# Patient Record
Sex: Male | Born: 1940 | Race: White | Hispanic: No | Marital: Married | Smoking: Former smoker
Health system: Southern US, Community
[De-identification: ages and names within clinical notes are randomized; demographics above are authoritative.]

## PROBLEM LIST (undated history)

## (undated) DIAGNOSIS — K5790 Diverticulosis of intestine, part unspecified, without perforation or abscess without bleeding: Secondary | ICD-10-CM

## (undated) DIAGNOSIS — Q212 Atrioventricular septal defect, unspecified as to partial or complete: Secondary | ICD-10-CM

## (undated) DIAGNOSIS — E119 Type 2 diabetes mellitus without complications: Secondary | ICD-10-CM

## (undated) DIAGNOSIS — I739 Peripheral vascular disease, unspecified: Secondary | ICD-10-CM

## (undated) DIAGNOSIS — C439 Malignant melanoma of skin, unspecified: Secondary | ICD-10-CM

## (undated) DIAGNOSIS — E785 Hyperlipidemia, unspecified: Secondary | ICD-10-CM

## (undated) DIAGNOSIS — I10 Essential (primary) hypertension: Secondary | ICD-10-CM

## (undated) DIAGNOSIS — H40059 Ocular hypertension, unspecified eye: Secondary | ICD-10-CM

## (undated) DIAGNOSIS — H43819 Vitreous degeneration, unspecified eye: Secondary | ICD-10-CM

## (undated) DIAGNOSIS — I5032 Chronic diastolic (congestive) heart failure: Secondary | ICD-10-CM

## (undated) DIAGNOSIS — M1990 Unspecified osteoarthritis, unspecified site: Secondary | ICD-10-CM

## (undated) DIAGNOSIS — M109 Gout, unspecified: Secondary | ICD-10-CM

## (undated) DIAGNOSIS — K2270 Barrett's esophagus without dysplasia: Secondary | ICD-10-CM

## (undated) DIAGNOSIS — K635 Polyp of colon: Secondary | ICD-10-CM

## (undated) DIAGNOSIS — D649 Anemia, unspecified: Secondary | ICD-10-CM

## (undated) DIAGNOSIS — C4490 Unspecified malignant neoplasm of skin, unspecified: Secondary | ICD-10-CM

## (undated) DIAGNOSIS — J219 Acute bronchiolitis, unspecified: Secondary | ICD-10-CM

## (undated) DIAGNOSIS — I219 Acute myocardial infarction, unspecified: Secondary | ICD-10-CM

## (undated) DIAGNOSIS — D1800 Hemangioma unspecified site: Secondary | ICD-10-CM

## (undated) DIAGNOSIS — I519 Heart disease, unspecified: Secondary | ICD-10-CM

## (undated) DIAGNOSIS — G4730 Sleep apnea, unspecified: Secondary | ICD-10-CM

## (undated) DIAGNOSIS — C801 Malignant (primary) neoplasm, unspecified: Secondary | ICD-10-CM

## (undated) HISTORY — PX: FEMORAL ARTERY STENT: SHX1583

## (undated) HISTORY — DX: Sleep apnea, unspecified: G47.30

## (undated) HISTORY — PX: HERNIA REPAIR: SHX51

## (undated) HISTORY — DX: Type 2 diabetes mellitus without complications: E11.9

## (undated) HISTORY — PX: NOSE SURGERY: SHX723

## (undated) HISTORY — DX: Malignant (primary) neoplasm, unspecified: C80.1

## (undated) HISTORY — DX: Barrett's esophagus without dysplasia: K22.70

## (undated) HISTORY — DX: Ocular hypertension, unspecified eye: H40.059

## (undated) HISTORY — DX: Atrioventricular septal defect, unspecified as to partial or complete: Q21.20

## (undated) HISTORY — DX: Vitreous degeneration, unspecified eye: H43.819

## (undated) HISTORY — DX: Malignant melanoma of skin, unspecified: C43.9

## (undated) HISTORY — PX: ROTATOR CUFF REPAIR: SHX139

## (undated) HISTORY — DX: Polyp of colon: K63.5

## (undated) HISTORY — DX: Hyperlipidemia, unspecified: E78.5

## (undated) HISTORY — DX: Unspecified malignant neoplasm of skin, unspecified: C44.90

## (undated) HISTORY — DX: Essential (primary) hypertension: I10

## (undated) HISTORY — DX: Diverticulosis of intestine, part unspecified, without perforation or abscess without bleeding: K57.90

## (undated) HISTORY — DX: Atrioventricular septal defect: Q21.2

## (undated) HISTORY — DX: Anemia, unspecified: D64.9

## (undated) HISTORY — DX: Heart disease, unspecified: I51.9

## (undated) HISTORY — DX: Acute bronchiolitis, unspecified: J21.9

## (undated) HISTORY — DX: Gout, unspecified: M10.9

## (undated) HISTORY — PX: CATARACT EXTRACTION: SUR2

## (undated) HISTORY — DX: Unspecified osteoarthritis, unspecified site: M19.90

## (undated) HISTORY — DX: Acute myocardial infarction, unspecified: I21.9

## (undated) HISTORY — DX: Hemangioma unspecified site: D18.00

## (undated) HISTORY — PX: INTRAOCULAR LENS INSERTION: SHX110

---

## 2000-12-10 DIAGNOSIS — C801 Malignant (primary) neoplasm, unspecified: Secondary | ICD-10-CM

## 2000-12-10 HISTORY — PX: PROSTATE SURGERY: SHX751

## 2000-12-10 HISTORY — DX: Malignant (primary) neoplasm, unspecified: C80.1

## 2005-09-25 ENCOUNTER — Ambulatory Visit: Payer: Self-pay

## 2005-12-06 ENCOUNTER — Ambulatory Visit: Payer: Self-pay | Admitting: Gastroenterology

## 2006-03-25 ENCOUNTER — Ambulatory Visit: Payer: Self-pay | Admitting: Internal Medicine

## 2006-05-02 ENCOUNTER — Ambulatory Visit: Payer: Self-pay | Admitting: Orthopaedic Surgery

## 2009-06-28 ENCOUNTER — Ambulatory Visit: Payer: Self-pay | Admitting: Specialist

## 2009-11-10 ENCOUNTER — Ambulatory Visit: Payer: Self-pay | Admitting: Internal Medicine

## 2009-12-27 ENCOUNTER — Ambulatory Visit: Payer: Self-pay | Admitting: Gastroenterology

## 2010-01-10 ENCOUNTER — Ambulatory Visit: Payer: Self-pay | Admitting: Gastroenterology

## 2010-02-09 ENCOUNTER — Ambulatory Visit: Payer: Self-pay | Admitting: Gastroenterology

## 2010-07-20 ENCOUNTER — Ambulatory Visit: Payer: Self-pay | Admitting: Gastroenterology

## 2010-11-06 ENCOUNTER — Ambulatory Visit: Payer: Self-pay | Admitting: Ophthalmology

## 2010-12-10 HISTORY — PX: CORONARY ANGIOPLASTY WITH STENT PLACEMENT: SHX49

## 2011-03-05 ENCOUNTER — Ambulatory Visit: Payer: Self-pay | Admitting: Ophthalmology

## 2011-06-04 ENCOUNTER — Inpatient Hospital Stay: Payer: Self-pay | Admitting: Internal Medicine

## 2011-07-19 ENCOUNTER — Encounter: Payer: Self-pay | Admitting: Internal Medicine

## 2011-08-11 ENCOUNTER — Encounter: Payer: Self-pay | Admitting: Internal Medicine

## 2011-08-17 ENCOUNTER — Ambulatory Visit: Payer: Self-pay | Admitting: Gastroenterology

## 2011-09-10 ENCOUNTER — Encounter: Payer: Self-pay | Admitting: Internal Medicine

## 2012-03-20 DIAGNOSIS — I2510 Atherosclerotic heart disease of native coronary artery without angina pectoris: Secondary | ICD-10-CM | POA: Diagnosis not present

## 2012-03-20 DIAGNOSIS — R011 Cardiac murmur, unspecified: Secondary | ICD-10-CM | POA: Diagnosis not present

## 2012-04-17 DIAGNOSIS — E7800 Pure hypercholesterolemia, unspecified: Secondary | ICD-10-CM | POA: Diagnosis not present

## 2012-04-17 DIAGNOSIS — D649 Anemia, unspecified: Secondary | ICD-10-CM | POA: Diagnosis not present

## 2012-04-17 DIAGNOSIS — E039 Hypothyroidism, unspecified: Secondary | ICD-10-CM | POA: Diagnosis not present

## 2012-04-17 DIAGNOSIS — Z125 Encounter for screening for malignant neoplasm of prostate: Secondary | ICD-10-CM | POA: Diagnosis not present

## 2012-04-17 DIAGNOSIS — E785 Hyperlipidemia, unspecified: Secondary | ICD-10-CM | POA: Diagnosis not present

## 2012-04-24 DIAGNOSIS — I214 Non-ST elevation (NSTEMI) myocardial infarction: Secondary | ICD-10-CM | POA: Diagnosis not present

## 2012-04-24 DIAGNOSIS — K5732 Diverticulitis of large intestine without perforation or abscess without bleeding: Secondary | ICD-10-CM | POA: Diagnosis not present

## 2012-04-24 DIAGNOSIS — R0602 Shortness of breath: Secondary | ICD-10-CM | POA: Diagnosis not present

## 2012-04-24 DIAGNOSIS — I209 Angina pectoris, unspecified: Secondary | ICD-10-CM | POA: Diagnosis not present

## 2012-04-24 DIAGNOSIS — E119 Type 2 diabetes mellitus without complications: Secondary | ICD-10-CM | POA: Diagnosis not present

## 2012-04-24 DIAGNOSIS — E785 Hyperlipidemia, unspecified: Secondary | ICD-10-CM | POA: Diagnosis not present

## 2012-08-14 DIAGNOSIS — I998 Other disorder of circulatory system: Secondary | ICD-10-CM | POA: Diagnosis not present

## 2012-08-14 DIAGNOSIS — I10 Essential (primary) hypertension: Secondary | ICD-10-CM | POA: Diagnosis not present

## 2012-08-14 DIAGNOSIS — E119 Type 2 diabetes mellitus without complications: Secondary | ICD-10-CM | POA: Diagnosis not present

## 2012-08-18 ENCOUNTER — Ambulatory Visit: Payer: Self-pay | Admitting: Gastroenterology

## 2012-08-18 DIAGNOSIS — R933 Abnormal findings on diagnostic imaging of other parts of digestive tract: Secondary | ICD-10-CM | POA: Diagnosis not present

## 2012-08-18 DIAGNOSIS — D1803 Hemangioma of intra-abdominal structures: Secondary | ICD-10-CM | POA: Diagnosis not present

## 2012-08-18 DIAGNOSIS — D1800 Hemangioma unspecified site: Secondary | ICD-10-CM | POA: Diagnosis not present

## 2012-08-21 DIAGNOSIS — Z79899 Other long term (current) drug therapy: Secondary | ICD-10-CM | POA: Diagnosis not present

## 2012-10-06 DIAGNOSIS — I209 Angina pectoris, unspecified: Secondary | ICD-10-CM | POA: Diagnosis not present

## 2012-10-06 DIAGNOSIS — I2510 Atherosclerotic heart disease of native coronary artery without angina pectoris: Secondary | ICD-10-CM | POA: Diagnosis not present

## 2012-10-27 DIAGNOSIS — I70219 Atherosclerosis of native arteries of extremities with intermittent claudication, unspecified extremity: Secondary | ICD-10-CM | POA: Diagnosis not present

## 2012-10-27 DIAGNOSIS — I259 Chronic ischemic heart disease, unspecified: Secondary | ICD-10-CM | POA: Diagnosis not present

## 2012-10-27 DIAGNOSIS — I739 Peripheral vascular disease, unspecified: Secondary | ICD-10-CM | POA: Diagnosis not present

## 2012-10-27 DIAGNOSIS — I252 Old myocardial infarction: Secondary | ICD-10-CM | POA: Diagnosis not present

## 2012-10-29 DIAGNOSIS — I739 Peripheral vascular disease, unspecified: Secondary | ICD-10-CM | POA: Diagnosis not present

## 2012-10-29 DIAGNOSIS — I70219 Atherosclerosis of native arteries of extremities with intermittent claudication, unspecified extremity: Secondary | ICD-10-CM | POA: Diagnosis not present

## 2012-10-31 DIAGNOSIS — J301 Allergic rhinitis due to pollen: Secondary | ICD-10-CM | POA: Diagnosis not present

## 2012-10-31 DIAGNOSIS — T7840XA Allergy, unspecified, initial encounter: Secondary | ICD-10-CM | POA: Diagnosis not present

## 2012-10-31 DIAGNOSIS — H6120 Impacted cerumen, unspecified ear: Secondary | ICD-10-CM | POA: Diagnosis not present

## 2012-11-03 DIAGNOSIS — E785 Hyperlipidemia, unspecified: Secondary | ICD-10-CM | POA: Diagnosis not present

## 2012-11-03 DIAGNOSIS — I10 Essential (primary) hypertension: Secondary | ICD-10-CM | POA: Diagnosis not present

## 2012-11-03 DIAGNOSIS — I739 Peripheral vascular disease, unspecified: Secondary | ICD-10-CM | POA: Diagnosis not present

## 2012-11-13 DIAGNOSIS — K219 Gastro-esophageal reflux disease without esophagitis: Secondary | ICD-10-CM | POA: Diagnosis not present

## 2012-11-13 DIAGNOSIS — I10 Essential (primary) hypertension: Secondary | ICD-10-CM | POA: Diagnosis not present

## 2012-11-13 DIAGNOSIS — E785 Hyperlipidemia, unspecified: Secondary | ICD-10-CM | POA: Diagnosis not present

## 2012-11-13 DIAGNOSIS — E119 Type 2 diabetes mellitus without complications: Secondary | ICD-10-CM | POA: Diagnosis not present

## 2012-11-13 DIAGNOSIS — E1149 Type 2 diabetes mellitus with other diabetic neurological complication: Secondary | ICD-10-CM | POA: Diagnosis not present

## 2012-11-17 DIAGNOSIS — D485 Neoplasm of uncertain behavior of skin: Secondary | ICD-10-CM | POA: Diagnosis not present

## 2012-11-17 DIAGNOSIS — L821 Other seborrheic keratosis: Secondary | ICD-10-CM | POA: Diagnosis not present

## 2012-11-17 DIAGNOSIS — L280 Lichen simplex chronicus: Secondary | ICD-10-CM | POA: Diagnosis not present

## 2012-11-17 DIAGNOSIS — L570 Actinic keratosis: Secondary | ICD-10-CM | POA: Diagnosis not present

## 2012-12-24 DIAGNOSIS — Z961 Presence of intraocular lens: Secondary | ICD-10-CM | POA: Diagnosis not present

## 2012-12-24 DIAGNOSIS — H2510 Age-related nuclear cataract, unspecified eye: Secondary | ICD-10-CM | POA: Diagnosis not present

## 2013-01-29 DIAGNOSIS — K2270 Barrett's esophagus without dysplasia: Secondary | ICD-10-CM | POA: Diagnosis not present

## 2013-02-12 DIAGNOSIS — M75100 Unspecified rotator cuff tear or rupture of unspecified shoulder, not specified as traumatic: Secondary | ICD-10-CM | POA: Diagnosis not present

## 2013-02-12 DIAGNOSIS — K219 Gastro-esophageal reflux disease without esophagitis: Secondary | ICD-10-CM | POA: Diagnosis not present

## 2013-02-12 DIAGNOSIS — E109 Type 1 diabetes mellitus without complications: Secondary | ICD-10-CM | POA: Diagnosis not present

## 2013-03-02 DIAGNOSIS — R0602 Shortness of breath: Secondary | ICD-10-CM | POA: Diagnosis not present

## 2013-03-03 DIAGNOSIS — R0602 Shortness of breath: Secondary | ICD-10-CM | POA: Diagnosis not present

## 2013-03-05 DIAGNOSIS — M5412 Radiculopathy, cervical region: Secondary | ICD-10-CM | POA: Diagnosis not present

## 2013-03-05 DIAGNOSIS — M6530 Trigger finger, unspecified finger: Secondary | ICD-10-CM | POA: Diagnosis not present

## 2013-03-19 DIAGNOSIS — M6530 Trigger finger, unspecified finger: Secondary | ICD-10-CM | POA: Diagnosis not present

## 2013-03-19 DIAGNOSIS — M5412 Radiculopathy, cervical region: Secondary | ICD-10-CM | POA: Diagnosis not present

## 2013-03-23 ENCOUNTER — Ambulatory Visit: Payer: Self-pay | Admitting: Internal Medicine

## 2013-03-23 DIAGNOSIS — R0602 Shortness of breath: Secondary | ICD-10-CM | POA: Diagnosis not present

## 2013-03-26 DIAGNOSIS — I200 Unstable angina: Secondary | ICD-10-CM | POA: Diagnosis not present

## 2013-03-26 DIAGNOSIS — I2510 Atherosclerotic heart disease of native coronary artery without angina pectoris: Secondary | ICD-10-CM | POA: Diagnosis not present

## 2013-04-02 ENCOUNTER — Ambulatory Visit: Payer: Self-pay | Admitting: Gastroenterology

## 2013-04-02 DIAGNOSIS — Z888 Allergy status to other drugs, medicaments and biological substances status: Secondary | ICD-10-CM | POA: Diagnosis not present

## 2013-04-02 DIAGNOSIS — K449 Diaphragmatic hernia without obstruction or gangrene: Secondary | ICD-10-CM | POA: Diagnosis not present

## 2013-04-02 DIAGNOSIS — E7800 Pure hypercholesterolemia, unspecified: Secondary | ICD-10-CM | POA: Diagnosis not present

## 2013-04-02 DIAGNOSIS — M109 Gout, unspecified: Secondary | ICD-10-CM | POA: Diagnosis not present

## 2013-04-02 DIAGNOSIS — Z91010 Allergy to peanuts: Secondary | ICD-10-CM | POA: Diagnosis not present

## 2013-04-02 DIAGNOSIS — Z87891 Personal history of nicotine dependence: Secondary | ICD-10-CM | POA: Diagnosis not present

## 2013-04-02 DIAGNOSIS — K2270 Barrett's esophagus without dysplasia: Secondary | ICD-10-CM | POA: Diagnosis not present

## 2013-04-02 DIAGNOSIS — Z79899 Other long term (current) drug therapy: Secondary | ICD-10-CM | POA: Diagnosis not present

## 2013-04-02 DIAGNOSIS — Z886 Allergy status to analgesic agent status: Secondary | ICD-10-CM | POA: Diagnosis not present

## 2013-04-02 DIAGNOSIS — N400 Enlarged prostate without lower urinary tract symptoms: Secondary | ICD-10-CM | POA: Diagnosis not present

## 2013-04-02 DIAGNOSIS — K5730 Diverticulosis of large intestine without perforation or abscess without bleeding: Secondary | ICD-10-CM | POA: Diagnosis not present

## 2013-04-02 DIAGNOSIS — I2510 Atherosclerotic heart disease of native coronary artery without angina pectoris: Secondary | ICD-10-CM | POA: Diagnosis not present

## 2013-04-02 DIAGNOSIS — I10 Essential (primary) hypertension: Secondary | ICD-10-CM | POA: Diagnosis not present

## 2013-04-02 DIAGNOSIS — Z887 Allergy status to serum and vaccine status: Secondary | ICD-10-CM | POA: Diagnosis not present

## 2013-04-02 DIAGNOSIS — E119 Type 2 diabetes mellitus without complications: Secondary | ICD-10-CM | POA: Diagnosis not present

## 2013-04-02 DIAGNOSIS — Z8546 Personal history of malignant neoplasm of prostate: Secondary | ICD-10-CM | POA: Diagnosis not present

## 2013-04-02 DIAGNOSIS — Z7982 Long term (current) use of aspirin: Secondary | ICD-10-CM | POA: Diagnosis not present

## 2013-04-16 DIAGNOSIS — K2270 Barrett's esophagus without dysplasia: Secondary | ICD-10-CM | POA: Diagnosis not present

## 2013-04-16 DIAGNOSIS — K5900 Constipation, unspecified: Secondary | ICD-10-CM | POA: Diagnosis not present

## 2013-04-16 DIAGNOSIS — K219 Gastro-esophageal reflux disease without esophagitis: Secondary | ICD-10-CM | POA: Diagnosis not present

## 2013-04-23 DIAGNOSIS — I209 Angina pectoris, unspecified: Secondary | ICD-10-CM | POA: Diagnosis not present

## 2013-04-23 DIAGNOSIS — E119 Type 2 diabetes mellitus without complications: Secondary | ICD-10-CM | POA: Diagnosis not present

## 2013-04-23 DIAGNOSIS — I10 Essential (primary) hypertension: Secondary | ICD-10-CM | POA: Diagnosis not present

## 2013-04-23 DIAGNOSIS — I248 Other forms of acute ischemic heart disease: Secondary | ICD-10-CM | POA: Diagnosis not present

## 2013-06-04 DIAGNOSIS — E119 Type 2 diabetes mellitus without complications: Secondary | ICD-10-CM | POA: Diagnosis not present

## 2013-07-02 DIAGNOSIS — I119 Hypertensive heart disease without heart failure: Secondary | ICD-10-CM | POA: Diagnosis not present

## 2013-07-02 DIAGNOSIS — M75100 Unspecified rotator cuff tear or rupture of unspecified shoulder, not specified as traumatic: Secondary | ICD-10-CM | POA: Diagnosis not present

## 2013-07-02 DIAGNOSIS — K219 Gastro-esophageal reflux disease without esophagitis: Secondary | ICD-10-CM | POA: Diagnosis not present

## 2013-07-02 DIAGNOSIS — K5730 Diverticulosis of large intestine without perforation or abscess without bleeding: Secondary | ICD-10-CM | POA: Diagnosis not present

## 2013-09-29 DIAGNOSIS — I2510 Atherosclerotic heart disease of native coronary artery without angina pectoris: Secondary | ICD-10-CM | POA: Diagnosis not present

## 2013-09-29 DIAGNOSIS — I209 Angina pectoris, unspecified: Secondary | ICD-10-CM | POA: Diagnosis not present

## 2013-09-29 DIAGNOSIS — I10 Essential (primary) hypertension: Secondary | ICD-10-CM | POA: Diagnosis not present

## 2013-10-05 DIAGNOSIS — H43819 Vitreous degeneration, unspecified eye: Secondary | ICD-10-CM | POA: Diagnosis not present

## 2013-10-06 DIAGNOSIS — L570 Actinic keratosis: Secondary | ICD-10-CM | POA: Diagnosis not present

## 2013-10-06 DIAGNOSIS — D485 Neoplasm of uncertain behavior of skin: Secondary | ICD-10-CM | POA: Diagnosis not present

## 2013-10-06 DIAGNOSIS — L821 Other seborrheic keratosis: Secondary | ICD-10-CM | POA: Diagnosis not present

## 2013-10-06 DIAGNOSIS — D235 Other benign neoplasm of skin of trunk: Secondary | ICD-10-CM | POA: Diagnosis not present

## 2013-10-08 DIAGNOSIS — I259 Chronic ischemic heart disease, unspecified: Secondary | ICD-10-CM | POA: Diagnosis not present

## 2013-10-08 DIAGNOSIS — I10 Essential (primary) hypertension: Secondary | ICD-10-CM | POA: Diagnosis not present

## 2013-10-08 DIAGNOSIS — E119 Type 2 diabetes mellitus without complications: Secondary | ICD-10-CM | POA: Diagnosis not present

## 2013-10-08 DIAGNOSIS — E785 Hyperlipidemia, unspecified: Secondary | ICD-10-CM | POA: Diagnosis not present

## 2013-11-24 DIAGNOSIS — H43819 Vitreous degeneration, unspecified eye: Secondary | ICD-10-CM | POA: Diagnosis not present

## 2013-12-29 DIAGNOSIS — H43399 Other vitreous opacities, unspecified eye: Secondary | ICD-10-CM | POA: Diagnosis not present

## 2014-02-18 DIAGNOSIS — I252 Old myocardial infarction: Secondary | ICD-10-CM | POA: Diagnosis not present

## 2014-02-18 DIAGNOSIS — I209 Angina pectoris, unspecified: Secondary | ICD-10-CM | POA: Diagnosis not present

## 2014-02-18 DIAGNOSIS — K219 Gastro-esophageal reflux disease without esophagitis: Secondary | ICD-10-CM | POA: Diagnosis not present

## 2014-02-18 DIAGNOSIS — I259 Chronic ischemic heart disease, unspecified: Secondary | ICD-10-CM | POA: Diagnosis not present

## 2014-02-23 DIAGNOSIS — R079 Chest pain, unspecified: Secondary | ICD-10-CM | POA: Diagnosis not present

## 2014-03-15 DIAGNOSIS — E119 Type 2 diabetes mellitus without complications: Secondary | ICD-10-CM | POA: Diagnosis not present

## 2014-03-15 DIAGNOSIS — K5732 Diverticulitis of large intestine without perforation or abscess without bleeding: Secondary | ICD-10-CM | POA: Diagnosis not present

## 2014-03-19 DIAGNOSIS — K5732 Diverticulitis of large intestine without perforation or abscess without bleeding: Secondary | ICD-10-CM | POA: Diagnosis not present

## 2014-03-19 DIAGNOSIS — N181 Chronic kidney disease, stage 1: Secondary | ICD-10-CM | POA: Diagnosis not present

## 2014-03-22 ENCOUNTER — Ambulatory Visit: Payer: Self-pay | Admitting: General Surgery

## 2014-03-23 DIAGNOSIS — K5732 Diverticulitis of large intestine without perforation or abscess without bleeding: Secondary | ICD-10-CM | POA: Diagnosis not present

## 2014-03-23 DIAGNOSIS — I252 Old myocardial infarction: Secondary | ICD-10-CM | POA: Diagnosis not present

## 2014-03-23 DIAGNOSIS — I259 Chronic ischemic heart disease, unspecified: Secondary | ICD-10-CM | POA: Diagnosis not present

## 2014-03-30 ENCOUNTER — Ambulatory Visit (INDEPENDENT_AMBULATORY_CARE_PROVIDER_SITE_OTHER): Payer: Medicare Other | Admitting: General Surgery

## 2014-03-30 ENCOUNTER — Encounter: Payer: Self-pay | Admitting: General Surgery

## 2014-03-30 VITALS — BP 120/72 | HR 68 | Resp 12 | Ht 67.0 in | Wt 177.0 lb

## 2014-03-30 DIAGNOSIS — M79609 Pain in unspecified limb: Secondary | ICD-10-CM

## 2014-03-30 DIAGNOSIS — M79606 Pain in leg, unspecified: Secondary | ICD-10-CM

## 2014-03-30 NOTE — Progress Notes (Signed)
Patient ID: Mike Decker, male   DOB: 07-16-1941, 73 y.o.   MRN: [id number]  Chief Complaint  Patient presents with  . Other    PVD    HPI Mike Decker is a 73 y.o. male who presents for an evaluation of bilateral leg pain. The pain has been present for approximately 1 year. The patient states he does not wear compression hose but does wear diabetic socks. He has not found anything to help alleviate the pain in his legs. The pain is located in the bottom of his feet up to his knees. The pain is present on a daily basis. Pain occurs when sitting and on walking varying distance.  HPI  Past Medical History  Diagnosis Date  . Arthritis   . Colon polyp   . Diabetes mellitus without complication   . Hyperlipidemia   . Myocardial infarct   . Heart disease   . Hypertension   . Atrioventricular canal (AVC)     irregular heart beats  . Diverticulosis   . Barrett esophagus   . Sleep apnea   . Gout   . Vitreoretinal degeneration   . Ocular hypertension   . Hemangioma     liver  . Skin cancer   . Cancer 2002    prostate    Past Surgical History  Procedure Laterality Date  . Prostate surgery  2002  . Coronary angioplasty with stent placement  2012  . Intraocular lens insertion    . Hernia repair      x2  . Cataract extraction  2011, 2012  . Rotator cuff repair Right   . Nose surgery      submucous resection    History reviewed. No pertinent family history.  Social History History  Substance Use Topics  . Smoking status: Former Smoker -- 20 years    Types: Cigarettes    Quit date: 12/10/1976  . Smokeless tobacco: Never Used  . Alcohol Use: No    Allergies  Allergen Reactions  . Peanut-Containing Drug Products Anaphylaxis  . Bee Venom Swelling  . Influenza Vaccines Hives  . Inh [Isoniazid] Hives  . Kenalog [Triamcinolone Acetonide] Hives  . Nalfon [Fenoprofen Calcium] Hives  . Penicillins Hives    Current Outpatient Prescriptions  Medication Sig  Dispense Refill  . acetaminophen (TYLENOL) 325 MG tablet Take 650 mg by mouth every 6 (six) hours as needed.      Marland Kitchen allopurinol (ZYLOPRIM) 100 MG tablet Take 200 mg by mouth daily.      Marland Kitchen amLODipine (NORVASC) 5 MG tablet Take 5 mg by mouth daily.      Marland Kitchen aspirin 81 MG tablet Take 81 mg by mouth daily.      . cetirizine (ZYRTEC) 10 MG tablet Take 10 mg by mouth as needed for allergies.      . citalopram (CELEXA) 10 MG tablet Take 10 mg by mouth daily.      . cloNIDine (CATAPRES) 0.1 MG tablet Take 0.1 mg by mouth daily.      Marland Kitchen EPINEPHrine (EPI-PEN) 0.3 mg/0.3 mL SOAJ injection Inject into the muscle as needed.      . insulin glargine (LANTUS) 100 UNIT/ML injection Inject 30 Units into the skin every morning.      . insulin glargine (LANTUS) 100 UNIT/ML injection Inject 10 Units into the skin at bedtime.      . isosorbide mononitrate (IMDUR) 60 MG 24 hr tablet Take 60 mg by mouth daily.      Marland Kitchen  metFORMIN (GLUMETZA) 1000 MG (MOD) 24 hr tablet Take 1,000 mg by mouth 2 (two) times daily with a meal.      . metoprolol (LOPRESSOR) 50 MG tablet Take 50 mg by mouth 2 (two) times daily.      . nitroGLYCERIN (NITROSTAT) 0.4 MG SL tablet Place 0.4 mg under the tongue every 5 (five) minutes as needed for chest pain.      . pantoprazole (PROTONIX) 40 MG tablet Take 40 mg by mouth 2 (two) times daily.      . prasugrel (EFFIENT) 10 MG TABS tablet Take 10 mg by mouth daily.      . simvastatin (ZOCOR) 20 MG tablet Take 20 mg by mouth daily.       No current facility-administered medications for this visit.    Review of Systems Review of Systems  Constitutional: Negative.   Respiratory: Negative.   Cardiovascular: Negative.     Blood pressure 120/72, pulse 68, resp. rate 12, height 5\' 7"  (1.702 m), weight 177 lb (80.287 kg).  Physical Exam Physical Exam  Constitutional: He is oriented to person, place, and time. He appears well-developed and well-nourished.  Eyes: Conjunctivae are normal. No scleral  icterus.  Neck: Neck supple. No thyromegaly present.  Cardiovascular: Normal rate, regular rhythm and normal heart sounds.   No murmur heard. Pulses:      Carotid pulses are 2+ on the right side, and 2+ on the left side.      Femoral pulses are 2+ on the right side, and 2+ on the left side.      Popliteal pulses are 2+ on the right side, and 2+ on the left side.       Dorsalis pedis pulses are 1+ on the right side, and 1+ on the left side.       Posterior tibial pulses are 2+ on the right side, and 2+ on the left side.  VV behind left knee. No edema. No stasis changes. No bruits heard.  Pulmonary/Chest: Effort normal and breath sounds normal.  Abdominal: Soft. Normal appearance and bowel sounds are normal. There is no hepatosplenomegaly. There is no tenderness. No hernia.  Lymphadenopathy:    He has no cervical adenopathy.  Neurological: He is alert and oriented to person, place, and time.  Skin: Skin is warm and dry.  Varicose veins present behind left knee.   Data Reviewed    Assessment    No evidence of PAD. He has focal VV left popliteal fossa without any sequelae from it. Pt's leg symptoms are not of vascular source.     Plan    Patient advised about findings. Follow up as needed.        Seeplaputhur G Sankar 03/31/2014, 6:48 AM

## 2014-03-30 NOTE — Patient Instructions (Signed)
Patient to return as needed. The patient is aware to call back for any questions or concerns. 

## 2014-03-31 ENCOUNTER — Encounter: Payer: Self-pay | Admitting: General Surgery

## 2014-04-01 DIAGNOSIS — I2510 Atherosclerotic heart disease of native coronary artery without angina pectoris: Secondary | ICD-10-CM | POA: Diagnosis not present

## 2014-04-01 DIAGNOSIS — I10 Essential (primary) hypertension: Secondary | ICD-10-CM | POA: Diagnosis not present

## 2014-04-01 DIAGNOSIS — E119 Type 2 diabetes mellitus without complications: Secondary | ICD-10-CM | POA: Diagnosis not present

## 2014-04-01 DIAGNOSIS — I209 Angina pectoris, unspecified: Secondary | ICD-10-CM | POA: Diagnosis not present

## 2014-04-06 ENCOUNTER — Ambulatory Visit: Payer: Self-pay | Admitting: Internal Medicine

## 2014-04-06 DIAGNOSIS — K5730 Diverticulosis of large intestine without perforation or abscess without bleeding: Secondary | ICD-10-CM | POA: Diagnosis not present

## 2014-04-06 DIAGNOSIS — K4090 Unilateral inguinal hernia, without obstruction or gangrene, not specified as recurrent: Secondary | ICD-10-CM | POA: Diagnosis not present

## 2014-04-06 DIAGNOSIS — D1809 Hemangioma of other sites: Secondary | ICD-10-CM | POA: Diagnosis not present

## 2014-04-08 DIAGNOSIS — E785 Hyperlipidemia, unspecified: Secondary | ICD-10-CM | POA: Diagnosis not present

## 2014-04-13 DIAGNOSIS — I10 Essential (primary) hypertension: Secondary | ICD-10-CM | POA: Diagnosis not present

## 2014-04-13 DIAGNOSIS — K219 Gastro-esophageal reflux disease without esophagitis: Secondary | ICD-10-CM | POA: Diagnosis not present

## 2014-04-13 DIAGNOSIS — K5732 Diverticulitis of large intestine without perforation or abscess without bleeding: Secondary | ICD-10-CM | POA: Diagnosis not present

## 2014-04-13 DIAGNOSIS — I259 Chronic ischemic heart disease, unspecified: Secondary | ICD-10-CM | POA: Diagnosis not present

## 2014-05-18 DIAGNOSIS — I10 Essential (primary) hypertension: Secondary | ICD-10-CM | POA: Diagnosis not present

## 2014-05-18 DIAGNOSIS — D631 Anemia in chronic kidney disease: Secondary | ICD-10-CM | POA: Diagnosis not present

## 2014-05-18 DIAGNOSIS — N183 Chronic kidney disease, stage 3 unspecified: Secondary | ICD-10-CM | POA: Diagnosis not present

## 2014-05-18 DIAGNOSIS — E119 Type 2 diabetes mellitus without complications: Secondary | ICD-10-CM | POA: Diagnosis not present

## 2014-05-18 DIAGNOSIS — N2581 Secondary hyperparathyroidism of renal origin: Secondary | ICD-10-CM | POA: Diagnosis not present

## 2014-05-27 DIAGNOSIS — N183 Chronic kidney disease, stage 3 unspecified: Secondary | ICD-10-CM | POA: Diagnosis not present

## 2014-06-15 DIAGNOSIS — N039 Chronic nephritic syndrome with unspecified morphologic changes: Secondary | ICD-10-CM | POA: Diagnosis not present

## 2014-06-15 DIAGNOSIS — N2581 Secondary hyperparathyroidism of renal origin: Secondary | ICD-10-CM | POA: Diagnosis not present

## 2014-06-15 DIAGNOSIS — N183 Chronic kidney disease, stage 3 unspecified: Secondary | ICD-10-CM | POA: Diagnosis not present

## 2014-06-15 DIAGNOSIS — E119 Type 2 diabetes mellitus without complications: Secondary | ICD-10-CM | POA: Diagnosis not present

## 2014-06-15 DIAGNOSIS — I10 Essential (primary) hypertension: Secondary | ICD-10-CM | POA: Diagnosis not present

## 2014-06-15 DIAGNOSIS — D631 Anemia in chronic kidney disease: Secondary | ICD-10-CM | POA: Diagnosis not present

## 2014-06-24 DIAGNOSIS — N183 Chronic kidney disease, stage 3 unspecified: Secondary | ICD-10-CM | POA: Diagnosis not present

## 2014-06-24 DIAGNOSIS — R809 Proteinuria, unspecified: Secondary | ICD-10-CM | POA: Diagnosis not present

## 2014-06-24 DIAGNOSIS — E1129 Type 2 diabetes mellitus with other diabetic kidney complication: Secondary | ICD-10-CM | POA: Diagnosis not present

## 2014-06-24 DIAGNOSIS — I129 Hypertensive chronic kidney disease with stage 1 through stage 4 chronic kidney disease, or unspecified chronic kidney disease: Secondary | ICD-10-CM | POA: Diagnosis not present

## 2014-07-07 DIAGNOSIS — I129 Hypertensive chronic kidney disease with stage 1 through stage 4 chronic kidney disease, or unspecified chronic kidney disease: Secondary | ICD-10-CM | POA: Diagnosis not present

## 2014-07-07 DIAGNOSIS — R809 Proteinuria, unspecified: Secondary | ICD-10-CM | POA: Diagnosis not present

## 2014-07-07 DIAGNOSIS — E1129 Type 2 diabetes mellitus with other diabetic kidney complication: Secondary | ICD-10-CM | POA: Diagnosis not present

## 2014-07-07 DIAGNOSIS — E1165 Type 2 diabetes mellitus with hyperglycemia: Secondary | ICD-10-CM | POA: Diagnosis not present

## 2014-07-07 DIAGNOSIS — N183 Chronic kidney disease, stage 3 unspecified: Secondary | ICD-10-CM | POA: Diagnosis not present

## 2014-07-12 DIAGNOSIS — I10 Essential (primary) hypertension: Secondary | ICD-10-CM | POA: Diagnosis not present

## 2014-07-12 DIAGNOSIS — N039 Chronic nephritic syndrome with unspecified morphologic changes: Secondary | ICD-10-CM | POA: Diagnosis not present

## 2014-07-12 DIAGNOSIS — N2581 Secondary hyperparathyroidism of renal origin: Secondary | ICD-10-CM | POA: Diagnosis not present

## 2014-07-12 DIAGNOSIS — E119 Type 2 diabetes mellitus without complications: Secondary | ICD-10-CM | POA: Diagnosis not present

## 2014-07-12 DIAGNOSIS — N183 Chronic kidney disease, stage 3 unspecified: Secondary | ICD-10-CM | POA: Diagnosis not present

## 2014-07-12 DIAGNOSIS — D631 Anemia in chronic kidney disease: Secondary | ICD-10-CM | POA: Diagnosis not present

## 2014-07-13 DIAGNOSIS — I509 Heart failure, unspecified: Secondary | ICD-10-CM | POA: Diagnosis not present

## 2014-07-13 DIAGNOSIS — E119 Type 2 diabetes mellitus without complications: Secondary | ICD-10-CM | POA: Diagnosis not present

## 2014-07-13 DIAGNOSIS — IMO0001 Reserved for inherently not codable concepts without codable children: Secondary | ICD-10-CM | POA: Diagnosis not present

## 2014-07-13 DIAGNOSIS — N181 Chronic kidney disease, stage 1: Secondary | ICD-10-CM | POA: Diagnosis not present

## 2014-07-27 ENCOUNTER — Ambulatory Visit: Payer: Self-pay | Admitting: Internal Medicine

## 2014-07-27 DIAGNOSIS — Z7189 Other specified counseling: Secondary | ICD-10-CM | POA: Diagnosis not present

## 2014-07-27 DIAGNOSIS — E1129 Type 2 diabetes mellitus with other diabetic kidney complication: Secondary | ICD-10-CM | POA: Diagnosis not present

## 2014-07-27 DIAGNOSIS — R809 Proteinuria, unspecified: Secondary | ICD-10-CM | POA: Diagnosis not present

## 2014-07-27 DIAGNOSIS — E119 Type 2 diabetes mellitus without complications: Secondary | ICD-10-CM | POA: Diagnosis not present

## 2014-07-27 DIAGNOSIS — E1165 Type 2 diabetes mellitus with hyperglycemia: Secondary | ICD-10-CM | POA: Diagnosis not present

## 2014-07-27 DIAGNOSIS — N183 Chronic kidney disease, stage 3 unspecified: Secondary | ICD-10-CM | POA: Diagnosis not present

## 2014-07-27 DIAGNOSIS — I129 Hypertensive chronic kidney disease with stage 1 through stage 4 chronic kidney disease, or unspecified chronic kidney disease: Secondary | ICD-10-CM | POA: Diagnosis not present

## 2014-08-03 DIAGNOSIS — I214 Non-ST elevation (NSTEMI) myocardial infarction: Secondary | ICD-10-CM | POA: Diagnosis not present

## 2014-08-03 DIAGNOSIS — K5732 Diverticulitis of large intestine without perforation or abscess without bleeding: Secondary | ICD-10-CM | POA: Diagnosis not present

## 2014-08-03 DIAGNOSIS — E119 Type 2 diabetes mellitus without complications: Secondary | ICD-10-CM | POA: Diagnosis not present

## 2014-08-03 DIAGNOSIS — I252 Old myocardial infarction: Secondary | ICD-10-CM | POA: Diagnosis not present

## 2014-08-09 DIAGNOSIS — Z7189 Other specified counseling: Secondary | ICD-10-CM | POA: Diagnosis not present

## 2014-08-09 DIAGNOSIS — E119 Type 2 diabetes mellitus without complications: Secondary | ICD-10-CM | POA: Diagnosis not present

## 2014-08-10 ENCOUNTER — Ambulatory Visit: Payer: Self-pay | Admitting: Internal Medicine

## 2014-08-10 DIAGNOSIS — Z7189 Other specified counseling: Secondary | ICD-10-CM | POA: Diagnosis not present

## 2014-08-10 DIAGNOSIS — E119 Type 2 diabetes mellitus without complications: Secondary | ICD-10-CM | POA: Diagnosis not present

## 2014-09-09 ENCOUNTER — Ambulatory Visit: Payer: Self-pay | Admitting: Internal Medicine

## 2014-09-09 DIAGNOSIS — I129 Hypertensive chronic kidney disease with stage 1 through stage 4 chronic kidney disease, or unspecified chronic kidney disease: Secondary | ICD-10-CM | POA: Diagnosis not present

## 2014-09-09 DIAGNOSIS — E875 Hyperkalemia: Secondary | ICD-10-CM | POA: Diagnosis not present

## 2014-09-09 DIAGNOSIS — E1165 Type 2 diabetes mellitus with hyperglycemia: Secondary | ICD-10-CM | POA: Diagnosis not present

## 2014-09-09 DIAGNOSIS — N2581 Secondary hyperparathyroidism of renal origin: Secondary | ICD-10-CM | POA: Diagnosis not present

## 2014-09-09 DIAGNOSIS — E1121 Type 2 diabetes mellitus with diabetic nephropathy: Secondary | ICD-10-CM | POA: Diagnosis not present

## 2014-09-09 DIAGNOSIS — N183 Chronic kidney disease, stage 3 (moderate): Secondary | ICD-10-CM | POA: Diagnosis not present

## 2014-09-09 DIAGNOSIS — R809 Proteinuria, unspecified: Secondary | ICD-10-CM | POA: Diagnosis not present

## 2014-09-16 DIAGNOSIS — I739 Peripheral vascular disease, unspecified: Secondary | ICD-10-CM | POA: Diagnosis not present

## 2014-09-16 DIAGNOSIS — I10 Essential (primary) hypertension: Secondary | ICD-10-CM | POA: Diagnosis not present

## 2014-09-16 DIAGNOSIS — K5732 Diverticulitis of large intestine without perforation or abscess without bleeding: Secondary | ICD-10-CM | POA: Diagnosis not present

## 2014-09-23 DIAGNOSIS — Z8719 Personal history of other diseases of the digestive system: Secondary | ICD-10-CM | POA: Diagnosis not present

## 2014-09-28 DIAGNOSIS — I2510 Atherosclerotic heart disease of native coronary artery without angina pectoris: Secondary | ICD-10-CM | POA: Diagnosis not present

## 2014-09-28 DIAGNOSIS — I739 Peripheral vascular disease, unspecified: Secondary | ICD-10-CM | POA: Diagnosis not present

## 2014-09-28 DIAGNOSIS — E119 Type 2 diabetes mellitus without complications: Secondary | ICD-10-CM | POA: Diagnosis not present

## 2014-09-28 DIAGNOSIS — I10 Essential (primary) hypertension: Secondary | ICD-10-CM | POA: Diagnosis not present

## 2014-10-05 DIAGNOSIS — D2261 Melanocytic nevi of right upper limb, including shoulder: Secondary | ICD-10-CM | POA: Diagnosis not present

## 2014-10-05 DIAGNOSIS — X32XXXA Exposure to sunlight, initial encounter: Secondary | ICD-10-CM | POA: Diagnosis not present

## 2014-10-05 DIAGNOSIS — L570 Actinic keratosis: Secondary | ICD-10-CM | POA: Diagnosis not present

## 2014-10-05 DIAGNOSIS — D2272 Melanocytic nevi of left lower limb, including hip: Secondary | ICD-10-CM | POA: Diagnosis not present

## 2014-10-05 DIAGNOSIS — L821 Other seborrheic keratosis: Secondary | ICD-10-CM | POA: Diagnosis not present

## 2014-10-05 DIAGNOSIS — D2262 Melanocytic nevi of left upper limb, including shoulder: Secondary | ICD-10-CM | POA: Diagnosis not present

## 2014-10-26 DIAGNOSIS — M79609 Pain in unspecified limb: Secondary | ICD-10-CM | POA: Diagnosis not present

## 2014-10-26 DIAGNOSIS — I10 Essential (primary) hypertension: Secondary | ICD-10-CM | POA: Diagnosis not present

## 2014-10-26 DIAGNOSIS — I2510 Atherosclerotic heart disease of native coronary artery without angina pectoris: Secondary | ICD-10-CM | POA: Diagnosis not present

## 2014-10-26 DIAGNOSIS — E119 Type 2 diabetes mellitus without complications: Secondary | ICD-10-CM | POA: Diagnosis not present

## 2014-11-03 ENCOUNTER — Ambulatory Visit: Payer: Self-pay | Admitting: Vascular Surgery

## 2014-11-03 DIAGNOSIS — I70213 Atherosclerosis of native arteries of extremities with intermittent claudication, bilateral legs: Secondary | ICD-10-CM | POA: Diagnosis not present

## 2014-11-03 DIAGNOSIS — Z9889 Other specified postprocedural states: Secondary | ICD-10-CM | POA: Diagnosis not present

## 2014-11-03 DIAGNOSIS — E119 Type 2 diabetes mellitus without complications: Secondary | ICD-10-CM | POA: Diagnosis not present

## 2014-11-03 DIAGNOSIS — Z79899 Other long term (current) drug therapy: Secondary | ICD-10-CM | POA: Diagnosis not present

## 2014-11-03 DIAGNOSIS — I739 Peripheral vascular disease, unspecified: Secondary | ICD-10-CM | POA: Diagnosis not present

## 2014-11-03 DIAGNOSIS — E785 Hyperlipidemia, unspecified: Secondary | ICD-10-CM | POA: Diagnosis not present

## 2014-11-03 DIAGNOSIS — Z87891 Personal history of nicotine dependence: Secondary | ICD-10-CM | POA: Diagnosis not present

## 2014-11-03 DIAGNOSIS — I10 Essential (primary) hypertension: Secondary | ICD-10-CM | POA: Diagnosis not present

## 2014-11-03 LAB — BASIC METABOLIC PANEL
Anion Gap: 7 (ref 7–16)
BUN: 43 mg/dL — ABNORMAL HIGH (ref 7–18)
CHLORIDE: 104 mmol/L (ref 98–107)
CREATININE: 1.68 mg/dL — AB (ref 0.60–1.30)
Calcium, Total: 8.9 mg/dL (ref 8.5–10.1)
Co2: 28 mmol/L (ref 21–32)
EGFR (Non-African Amer.): 43 — ABNORMAL LOW
GFR CALC AF AMER: 52 — AB
GLUCOSE: 234 mg/dL — AB (ref 65–99)
Osmolality: 296 (ref 275–301)
Potassium: 4.6 mmol/L (ref 3.5–5.1)
Sodium: 139 mmol/L (ref 136–145)

## 2014-11-30 DIAGNOSIS — Z794 Long term (current) use of insulin: Secondary | ICD-10-CM | POA: Diagnosis not present

## 2014-11-30 DIAGNOSIS — N183 Chronic kidney disease, stage 3 (moderate): Secondary | ICD-10-CM | POA: Diagnosis not present

## 2014-11-30 DIAGNOSIS — G63 Polyneuropathy in diseases classified elsewhere: Secondary | ICD-10-CM | POA: Diagnosis not present

## 2014-11-30 DIAGNOSIS — E1140 Type 2 diabetes mellitus with diabetic neuropathy, unspecified: Secondary | ICD-10-CM | POA: Diagnosis not present

## 2014-12-14 DIAGNOSIS — I119 Hypertensive heart disease without heart failure: Secondary | ICD-10-CM | POA: Diagnosis not present

## 2014-12-14 DIAGNOSIS — I429 Cardiomyopathy, unspecified: Secondary | ICD-10-CM | POA: Diagnosis not present

## 2014-12-14 DIAGNOSIS — E1142 Type 2 diabetes mellitus with diabetic polyneuropathy: Secondary | ICD-10-CM | POA: Diagnosis not present

## 2014-12-14 DIAGNOSIS — E119 Type 2 diabetes mellitus without complications: Secondary | ICD-10-CM | POA: Diagnosis not present

## 2014-12-21 DIAGNOSIS — I10 Essential (primary) hypertension: Secondary | ICD-10-CM | POA: Diagnosis not present

## 2014-12-21 DIAGNOSIS — E119 Type 2 diabetes mellitus without complications: Secondary | ICD-10-CM | POA: Diagnosis not present

## 2014-12-21 DIAGNOSIS — I70213 Atherosclerosis of native arteries of extremities with intermittent claudication, bilateral legs: Secondary | ICD-10-CM | POA: Diagnosis not present

## 2014-12-21 DIAGNOSIS — E785 Hyperlipidemia, unspecified: Secondary | ICD-10-CM | POA: Diagnosis not present

## 2014-12-21 DIAGNOSIS — I739 Peripheral vascular disease, unspecified: Secondary | ICD-10-CM | POA: Diagnosis not present

## 2014-12-23 DIAGNOSIS — E1122 Type 2 diabetes mellitus with diabetic chronic kidney disease: Secondary | ICD-10-CM | POA: Diagnosis not present

## 2014-12-23 DIAGNOSIS — I129 Hypertensive chronic kidney disease with stage 1 through stage 4 chronic kidney disease, or unspecified chronic kidney disease: Secondary | ICD-10-CM | POA: Diagnosis not present

## 2014-12-23 DIAGNOSIS — N2581 Secondary hyperparathyroidism of renal origin: Secondary | ICD-10-CM | POA: Diagnosis not present

## 2014-12-23 DIAGNOSIS — N183 Chronic kidney disease, stage 3 (moderate): Secondary | ICD-10-CM | POA: Diagnosis not present

## 2014-12-23 DIAGNOSIS — R809 Proteinuria, unspecified: Secondary | ICD-10-CM | POA: Diagnosis not present

## 2015-01-11 DIAGNOSIS — Z86010 Personal history of colonic polyps: Secondary | ICD-10-CM | POA: Diagnosis not present

## 2015-01-12 DIAGNOSIS — E119 Type 2 diabetes mellitus without complications: Secondary | ICD-10-CM | POA: Diagnosis not present

## 2015-02-01 DIAGNOSIS — Z794 Long term (current) use of insulin: Secondary | ICD-10-CM | POA: Diagnosis not present

## 2015-02-01 DIAGNOSIS — G629 Polyneuropathy, unspecified: Secondary | ICD-10-CM | POA: Diagnosis not present

## 2015-02-01 DIAGNOSIS — N183 Chronic kidney disease, stage 3 (moderate): Secondary | ICD-10-CM | POA: Diagnosis not present

## 2015-02-01 DIAGNOSIS — E1165 Type 2 diabetes mellitus with hyperglycemia: Secondary | ICD-10-CM | POA: Diagnosis not present

## 2015-02-17 ENCOUNTER — Ambulatory Visit: Payer: Self-pay | Admitting: Gastroenterology

## 2015-02-17 DIAGNOSIS — K5730 Diverticulosis of large intestine without perforation or abscess without bleeding: Secondary | ICD-10-CM | POA: Diagnosis not present

## 2015-02-17 DIAGNOSIS — I2510 Atherosclerotic heart disease of native coronary artery without angina pectoris: Secondary | ICD-10-CM | POA: Diagnosis not present

## 2015-02-17 DIAGNOSIS — Z880 Allergy status to penicillin: Secondary | ICD-10-CM | POA: Diagnosis not present

## 2015-02-17 DIAGNOSIS — Z7982 Long term (current) use of aspirin: Secondary | ICD-10-CM | POA: Diagnosis not present

## 2015-02-17 DIAGNOSIS — Z86010 Personal history of colonic polyps: Secondary | ICD-10-CM | POA: Diagnosis not present

## 2015-02-17 DIAGNOSIS — E119 Type 2 diabetes mellitus without complications: Secondary | ICD-10-CM | POA: Diagnosis not present

## 2015-02-17 DIAGNOSIS — Z886 Allergy status to analgesic agent status: Secondary | ICD-10-CM | POA: Diagnosis not present

## 2015-02-17 DIAGNOSIS — M109 Gout, unspecified: Secondary | ICD-10-CM | POA: Diagnosis not present

## 2015-02-17 DIAGNOSIS — D125 Benign neoplasm of sigmoid colon: Secondary | ICD-10-CM | POA: Diagnosis not present

## 2015-02-17 DIAGNOSIS — I10 Essential (primary) hypertension: Secondary | ICD-10-CM | POA: Diagnosis not present

## 2015-02-17 DIAGNOSIS — D123 Benign neoplasm of transverse colon: Secondary | ICD-10-CM | POA: Diagnosis not present

## 2015-02-17 DIAGNOSIS — I739 Peripheral vascular disease, unspecified: Secondary | ICD-10-CM | POA: Diagnosis not present

## 2015-03-09 DIAGNOSIS — E784 Other hyperlipidemia: Secondary | ICD-10-CM | POA: Diagnosis not present

## 2015-03-09 DIAGNOSIS — I10 Essential (primary) hypertension: Secondary | ICD-10-CM | POA: Diagnosis not present

## 2015-03-09 DIAGNOSIS — R5381 Other malaise: Secondary | ICD-10-CM | POA: Diagnosis not present

## 2015-03-15 DIAGNOSIS — I214 Non-ST elevation (NSTEMI) myocardial infarction: Secondary | ICD-10-CM | POA: Diagnosis not present

## 2015-03-15 DIAGNOSIS — I252 Old myocardial infarction: Secondary | ICD-10-CM | POA: Diagnosis not present

## 2015-03-15 DIAGNOSIS — E784 Other hyperlipidemia: Secondary | ICD-10-CM | POA: Diagnosis not present

## 2015-03-15 DIAGNOSIS — E119 Type 2 diabetes mellitus without complications: Secondary | ICD-10-CM | POA: Diagnosis not present

## 2015-03-22 DIAGNOSIS — E785 Hyperlipidemia, unspecified: Secondary | ICD-10-CM | POA: Diagnosis not present

## 2015-03-22 DIAGNOSIS — E119 Type 2 diabetes mellitus without complications: Secondary | ICD-10-CM | POA: Diagnosis not present

## 2015-03-22 DIAGNOSIS — I10 Essential (primary) hypertension: Secondary | ICD-10-CM | POA: Diagnosis not present

## 2015-03-22 DIAGNOSIS — I70212 Atherosclerosis of native arteries of extremities with intermittent claudication, left leg: Secondary | ICD-10-CM | POA: Diagnosis not present

## 2015-03-22 DIAGNOSIS — I739 Peripheral vascular disease, unspecified: Secondary | ICD-10-CM | POA: Diagnosis not present

## 2015-03-24 DIAGNOSIS — N2581 Secondary hyperparathyroidism of renal origin: Secondary | ICD-10-CM | POA: Diagnosis not present

## 2015-03-24 DIAGNOSIS — I129 Hypertensive chronic kidney disease with stage 1 through stage 4 chronic kidney disease, or unspecified chronic kidney disease: Secondary | ICD-10-CM | POA: Diagnosis not present

## 2015-03-24 DIAGNOSIS — E1122 Type 2 diabetes mellitus with diabetic chronic kidney disease: Secondary | ICD-10-CM | POA: Diagnosis not present

## 2015-03-24 DIAGNOSIS — N183 Chronic kidney disease, stage 3 (moderate): Secondary | ICD-10-CM | POA: Diagnosis not present

## 2015-03-24 DIAGNOSIS — R809 Proteinuria, unspecified: Secondary | ICD-10-CM | POA: Diagnosis not present

## 2015-04-02 NOTE — Op Note (Signed)
PATIENT NAME:  Mike Decker, Mike Decker MR#:  [id number] DATE OF BIRTH:  06/11/1941  DATE OF PROCEDURE:  11/03/2014  PREOPERATIVE DIAGNOSES:  1. Peripheral arterial disease with claudication, right lower extremity.  2. Hyperlipidemia.  3. Hypertension.  POSTOPERATIVE DIAGNOSES:  1. Peripheral arterial disease with claudication, right lower extremity.  2. Hyperlipidemia.  3. Hypertension.  PROCEDURES PERFORMED; 1. Ultrasound guidance for vascular access, left femoral artery.  2. Catheter placement to right popliteal artery from left femoral approach.  3. Aortogram and selective right lower extremity angiogram.  4. Percutaneous transluminal angioplasty of right superficial femoral artery occlusion with 6 mm diameter Lutonix drug-coated angioplasty balloon.  5. Self-expanding stent placement to right superficial femoral artery for greater than 50% residual stenosis and dissection after angioplasty with a 7 mm diameter x 12 cm length self-expanding stent.  6. StarClose closure device, left femoral artery.   SURGEON: Algernon Huxley, M.D.   ANESTHESIA: Local with moderate conscious sedation.   ESTIMATED BLOOD LOSS: 25 mL.   FLUOROSCOPY TIME: Approximately 4 minutes.   INDICATION FOR PROCEDURE: A 74 year old gentleman with lifestyle limiting peripheral arterial disease with claudication of the right lower extremity. He was seen in the office and desired treatment as he had stopped smoking and tried to walk as much as possible, but his symptoms remain lifestyle limiting. The risks and benefits were discussed. Informed consent was obtained.   DESCRIPTION OF PROCEDURE: The patient was brought to the vascular suite. Groins were shaved and prepped and a sterile surgical field was created. The left femoral artery was visualized with ultrasound and found to be widely patent. It was then accessed under direct ultrasound guidance without difficulty with a Seldinger needle, and a permanent image was recorded. A  J-wire and a 5 French sheath were placed. Pigtail catheter was placed in the aorta at the L1 level and an AP aortogram was performed. This showed patent renal arteries with some mild stenosis of the right renal artery about a centimeter beyond the origin. The aorta was somewhat irregular and appeared to be slightly enlarged, although not necessarily aneurysmal. The right iliac artery had about a 20% to 30% stenosis at its origin and was not flow limiting. The left iliac artery had a lesser degree of stenosis. The hypogastric arteries were patent and the external iliac arteries were patent.   I then crossed the aortic bifurcation with the pigtail catheter and an Advantage wire, and advanced to the right femoral head. Selective right lower extremity angiogram was then performed. This showed a normal common femoral artery, profunda femoris artery. The superficial femoral artery was normal proximally; however, in the midsegment, it occluded over about a 6 to 8 cm range with some mild stenosis above and below the short segment occlusion. The distal SFA reconstituted just above Hunter canal. There was then 2 vessel runoff distally with an occluded anterior tibial artery, but the peroneal artery and posterior tibial arteries were patent. The patient was given 4000 units of intravenous heparin for systemic anticoagulation and a 6 [place] Ansell sheath was placed over the [employer] wire. I was able to cross the occlusion without difficulty with the Advantage wire and a Kumpe catheter and confirm intraluminal flow in the popliteal artery below the occlusion.   I then replaced the wire. I treated this lesion with a 6 mm diameter Lutonix drug-coated angioplasty balloon encompassing the lesion. Narrowing was seen with inflation, which resolved with angioplasty. Completion angiogram, however, showed significant dissection in the mid to distal segment  of the angioplasty site with greater than 50% residual stenosis, and I  elected to cover it. Also, the flow appeared quite sluggish through here. I elected to cover this area with a self-expanding stent. A 7 mm diameter x 12 cm length self-expanding stent was deployed, postdilated with a 6 mm balloon with an excellent angiographic completion result and more brisk flow.   At this point, I elected to terminate the procedure. The sheath was removed. StarClose closure device was deployed in the usual fashion with excellent hemostatic result. The patient tolerated the procedure well and was taken to the recovery room in stable condition.   ____________________________ Algernon Huxley, MD jsd:JT D: 11/03/2014 09:01:22 ET T: 11/03/2014 11:31:35 ET JOB#: [id number]  cc: Algernon Huxley, MD, <Dictator> Cletis Athens, MD Algernon Huxley MD ELECTRONICALLY SIGNED 11/21/2014 11:41

## 2015-04-04 LAB — SURGICAL PATHOLOGY

## 2015-04-19 DIAGNOSIS — E119 Type 2 diabetes mellitus without complications: Secondary | ICD-10-CM | POA: Diagnosis not present

## 2015-04-19 DIAGNOSIS — I739 Peripheral vascular disease, unspecified: Secondary | ICD-10-CM | POA: Diagnosis not present

## 2015-04-19 DIAGNOSIS — I208 Other forms of angina pectoris: Secondary | ICD-10-CM | POA: Diagnosis not present

## 2015-04-19 DIAGNOSIS — I2510 Atherosclerotic heart disease of native coronary artery without angina pectoris: Secondary | ICD-10-CM | POA: Diagnosis not present

## 2015-05-03 DIAGNOSIS — E1165 Type 2 diabetes mellitus with hyperglycemia: Secondary | ICD-10-CM | POA: Diagnosis not present

## 2015-05-10 DIAGNOSIS — E1142 Type 2 diabetes mellitus with diabetic polyneuropathy: Secondary | ICD-10-CM | POA: Diagnosis not present

## 2015-05-10 DIAGNOSIS — E1165 Type 2 diabetes mellitus with hyperglycemia: Secondary | ICD-10-CM | POA: Diagnosis not present

## 2015-05-10 DIAGNOSIS — Z794 Long term (current) use of insulin: Secondary | ICD-10-CM | POA: Diagnosis not present

## 2015-05-10 DIAGNOSIS — E1122 Type 2 diabetes mellitus with diabetic chronic kidney disease: Secondary | ICD-10-CM | POA: Diagnosis not present

## 2015-07-05 DIAGNOSIS — I214 Non-ST elevation (NSTEMI) myocardial infarction: Secondary | ICD-10-CM | POA: Diagnosis not present

## 2015-07-05 DIAGNOSIS — E784 Other hyperlipidemia: Secondary | ICD-10-CM | POA: Diagnosis not present

## 2015-07-05 DIAGNOSIS — G4733 Obstructive sleep apnea (adult) (pediatric): Secondary | ICD-10-CM | POA: Diagnosis not present

## 2015-07-05 DIAGNOSIS — E119 Type 2 diabetes mellitus without complications: Secondary | ICD-10-CM | POA: Diagnosis not present

## 2015-08-17 DIAGNOSIS — G4733 Obstructive sleep apnea (adult) (pediatric): Secondary | ICD-10-CM | POA: Diagnosis not present

## 2015-08-23 DIAGNOSIS — E1122 Type 2 diabetes mellitus with diabetic chronic kidney disease: Secondary | ICD-10-CM | POA: Diagnosis not present

## 2015-08-23 DIAGNOSIS — N183 Chronic kidney disease, stage 3 (moderate): Secondary | ICD-10-CM | POA: Diagnosis not present

## 2015-08-23 DIAGNOSIS — E1165 Type 2 diabetes mellitus with hyperglycemia: Secondary | ICD-10-CM | POA: Diagnosis not present

## 2015-08-30 DIAGNOSIS — E1122 Type 2 diabetes mellitus with diabetic chronic kidney disease: Secondary | ICD-10-CM | POA: Diagnosis not present

## 2015-08-30 DIAGNOSIS — E1142 Type 2 diabetes mellitus with diabetic polyneuropathy: Secondary | ICD-10-CM | POA: Diagnosis not present

## 2015-08-30 DIAGNOSIS — E1165 Type 2 diabetes mellitus with hyperglycemia: Secondary | ICD-10-CM | POA: Diagnosis not present

## 2015-08-30 DIAGNOSIS — Z794 Long term (current) use of insulin: Secondary | ICD-10-CM | POA: Diagnosis not present

## 2015-08-30 DIAGNOSIS — N183 Chronic kidney disease, stage 3 (moderate): Secondary | ICD-10-CM | POA: Diagnosis not present

## 2015-09-09 DIAGNOSIS — I129 Hypertensive chronic kidney disease with stage 1 through stage 4 chronic kidney disease, or unspecified chronic kidney disease: Secondary | ICD-10-CM | POA: Diagnosis not present

## 2015-09-09 DIAGNOSIS — N183 Chronic kidney disease, stage 3 (moderate): Secondary | ICD-10-CM | POA: Diagnosis not present

## 2015-09-09 DIAGNOSIS — E1122 Type 2 diabetes mellitus with diabetic chronic kidney disease: Secondary | ICD-10-CM | POA: Diagnosis not present

## 2015-09-09 DIAGNOSIS — N2581 Secondary hyperparathyroidism of renal origin: Secondary | ICD-10-CM | POA: Diagnosis not present

## 2015-09-09 DIAGNOSIS — R809 Proteinuria, unspecified: Secondary | ICD-10-CM | POA: Diagnosis not present

## 2015-09-22 DIAGNOSIS — I10 Essential (primary) hypertension: Secondary | ICD-10-CM | POA: Diagnosis not present

## 2015-09-22 DIAGNOSIS — N183 Chronic kidney disease, stage 3 (moderate): Secondary | ICD-10-CM | POA: Diagnosis not present

## 2015-09-22 DIAGNOSIS — N2581 Secondary hyperparathyroidism of renal origin: Secondary | ICD-10-CM | POA: Diagnosis not present

## 2015-09-22 DIAGNOSIS — E1122 Type 2 diabetes mellitus with diabetic chronic kidney disease: Secondary | ICD-10-CM | POA: Diagnosis not present

## 2015-09-27 DIAGNOSIS — I70212 Atherosclerosis of native arteries of extremities with intermittent claudication, left leg: Secondary | ICD-10-CM | POA: Diagnosis not present

## 2015-09-27 DIAGNOSIS — E119 Type 2 diabetes mellitus without complications: Secondary | ICD-10-CM | POA: Diagnosis not present

## 2015-09-27 DIAGNOSIS — I10 Essential (primary) hypertension: Secondary | ICD-10-CM | POA: Diagnosis not present

## 2015-09-27 DIAGNOSIS — E785 Hyperlipidemia, unspecified: Secondary | ICD-10-CM | POA: Diagnosis not present

## 2015-09-27 DIAGNOSIS — I739 Peripheral vascular disease, unspecified: Secondary | ICD-10-CM | POA: Diagnosis not present

## 2015-09-27 DIAGNOSIS — I70213 Atherosclerosis of native arteries of extremities with intermittent claudication, bilateral legs: Secondary | ICD-10-CM | POA: Diagnosis not present

## 2015-09-27 DIAGNOSIS — N183 Chronic kidney disease, stage 3 (moderate): Secondary | ICD-10-CM | POA: Diagnosis not present

## 2015-10-04 DIAGNOSIS — C44519 Basal cell carcinoma of skin of other part of trunk: Secondary | ICD-10-CM | POA: Diagnosis not present

## 2015-10-04 DIAGNOSIS — D2272 Melanocytic nevi of left lower limb, including hip: Secondary | ICD-10-CM | POA: Diagnosis not present

## 2015-10-04 DIAGNOSIS — D2262 Melanocytic nevi of left upper limb, including shoulder: Secondary | ICD-10-CM | POA: Diagnosis not present

## 2015-10-04 DIAGNOSIS — D485 Neoplasm of uncertain behavior of skin: Secondary | ICD-10-CM | POA: Diagnosis not present

## 2015-10-04 DIAGNOSIS — L570 Actinic keratosis: Secondary | ICD-10-CM | POA: Diagnosis not present

## 2015-10-04 DIAGNOSIS — X32XXXA Exposure to sunlight, initial encounter: Secondary | ICD-10-CM | POA: Diagnosis not present

## 2015-10-04 DIAGNOSIS — D225 Melanocytic nevi of trunk: Secondary | ICD-10-CM | POA: Diagnosis not present

## 2015-10-04 DIAGNOSIS — L821 Other seborrheic keratosis: Secondary | ICD-10-CM | POA: Diagnosis not present

## 2015-10-08 ENCOUNTER — Observation Stay: Payer: Medicare Other

## 2015-10-08 ENCOUNTER — Emergency Department: Payer: Medicare Other

## 2015-10-08 ENCOUNTER — Encounter: Payer: Self-pay | Admitting: Emergency Medicine

## 2015-10-08 ENCOUNTER — Observation Stay
Admission: EM | Admit: 2015-10-08 | Discharge: 2015-10-09 | Disposition: A | Discharge status: Home / Self Care | Payer: Medicare Other | Attending: Internal Medicine | Admitting: Internal Medicine

## 2015-10-08 DIAGNOSIS — I739 Peripheral vascular disease, unspecified: Secondary | ICD-10-CM | POA: Diagnosis not present

## 2015-10-08 DIAGNOSIS — Z887 Allergy status to serum and vaccine status: Secondary | ICD-10-CM | POA: Diagnosis not present

## 2015-10-08 DIAGNOSIS — E119 Type 2 diabetes mellitus without complications: Secondary | ICD-10-CM | POA: Insufficient documentation

## 2015-10-08 DIAGNOSIS — I119 Hypertensive heart disease without heart failure: Secondary | ICD-10-CM | POA: Diagnosis not present

## 2015-10-08 DIAGNOSIS — G4730 Sleep apnea, unspecified: Secondary | ICD-10-CM | POA: Diagnosis not present

## 2015-10-08 DIAGNOSIS — Z7982 Long term (current) use of aspirin: Secondary | ICD-10-CM | POA: Diagnosis not present

## 2015-10-08 DIAGNOSIS — Z85828 Personal history of other malignant neoplasm of skin: Secondary | ICD-10-CM | POA: Insufficient documentation

## 2015-10-08 DIAGNOSIS — Z9841 Cataract extraction status, right eye: Secondary | ICD-10-CM | POA: Diagnosis not present

## 2015-10-08 DIAGNOSIS — Z9842 Cataract extraction status, left eye: Secondary | ICD-10-CM | POA: Diagnosis not present

## 2015-10-08 DIAGNOSIS — E785 Hyperlipidemia, unspecified: Secondary | ICD-10-CM | POA: Diagnosis not present

## 2015-10-08 DIAGNOSIS — Z87891 Personal history of nicotine dependence: Secondary | ICD-10-CM | POA: Diagnosis not present

## 2015-10-08 DIAGNOSIS — Z7902 Long term (current) use of antithrombotics/antiplatelets: Secondary | ICD-10-CM | POA: Insufficient documentation

## 2015-10-08 DIAGNOSIS — R4781 Slurred speech: Secondary | ICD-10-CM | POA: Insufficient documentation

## 2015-10-08 DIAGNOSIS — Z86010 Personal history of colonic polyps: Secondary | ICD-10-CM | POA: Diagnosis not present

## 2015-10-08 DIAGNOSIS — I2510 Atherosclerotic heart disease of native coronary artery without angina pectoris: Secondary | ICD-10-CM | POA: Insufficient documentation

## 2015-10-08 DIAGNOSIS — G459 Transient cerebral ischemic attack, unspecified: Secondary | ICD-10-CM | POA: Diagnosis not present

## 2015-10-08 DIAGNOSIS — Z823 Family history of stroke: Secondary | ICD-10-CM | POA: Insufficient documentation

## 2015-10-08 DIAGNOSIS — Z794 Long term (current) use of insulin: Secondary | ICD-10-CM | POA: Diagnosis not present

## 2015-10-08 DIAGNOSIS — I252 Old myocardial infarction: Secondary | ICD-10-CM | POA: Insufficient documentation

## 2015-10-08 DIAGNOSIS — I10 Essential (primary) hypertension: Secondary | ICD-10-CM | POA: Diagnosis not present

## 2015-10-08 DIAGNOSIS — Z880 Allergy status to penicillin: Secondary | ICD-10-CM | POA: Diagnosis not present

## 2015-10-08 DIAGNOSIS — K2270 Barrett's esophagus without dysplasia: Secondary | ICD-10-CM | POA: Insufficient documentation

## 2015-10-08 DIAGNOSIS — Z888 Allergy status to other drugs, medicaments and biological substances status: Secondary | ICD-10-CM | POA: Insufficient documentation

## 2015-10-08 DIAGNOSIS — I6523 Occlusion and stenosis of bilateral carotid arteries: Secondary | ICD-10-CM | POA: Insufficient documentation

## 2015-10-08 DIAGNOSIS — R531 Weakness: Secondary | ICD-10-CM | POA: Insufficient documentation

## 2015-10-08 DIAGNOSIS — Z79899 Other long term (current) drug therapy: Secondary | ICD-10-CM | POA: Diagnosis not present

## 2015-10-08 DIAGNOSIS — G919 Hydrocephalus, unspecified: Secondary | ICD-10-CM | POA: Insufficient documentation

## 2015-10-08 DIAGNOSIS — Z8546 Personal history of malignant neoplasm of prostate: Secondary | ICD-10-CM | POA: Diagnosis not present

## 2015-10-08 DIAGNOSIS — M109 Gout, unspecified: Secondary | ICD-10-CM | POA: Diagnosis not present

## 2015-10-08 DIAGNOSIS — R262 Difficulty in walking, not elsewhere classified: Secondary | ICD-10-CM | POA: Diagnosis not present

## 2015-10-08 DIAGNOSIS — I639 Cerebral infarction, unspecified: Secondary | ICD-10-CM | POA: Diagnosis not present

## 2015-10-08 HISTORY — DX: Peripheral vascular disease, unspecified: I73.9

## 2015-10-08 LAB — COMPREHENSIVE METABOLIC PANEL
ALBUMIN: 4.7 g/dL (ref 3.5–5.0)
ALT: 13 U/L — ABNORMAL LOW (ref 17–63)
ANION GAP: 10 (ref 5–15)
AST: 17 U/L (ref 15–41)
Alkaline Phosphatase: 84 U/L (ref 38–126)
BUN: 42 mg/dL — ABNORMAL HIGH (ref 6–20)
CALCIUM: 9.8 mg/dL (ref 8.9–10.3)
CHLORIDE: 97 mmol/L — AB (ref 101–111)
CO2: 25 mmol/L (ref 22–32)
Creatinine, Ser: 1.72 mg/dL — ABNORMAL HIGH (ref 0.61–1.24)
GFR calc non Af Amer: 37 mL/min — ABNORMAL LOW (ref >60)
GFR, EST AFRICAN AMERICAN: 43 mL/min — AB (ref >60)
Glucose, Bld: 349 mg/dL — ABNORMAL HIGH (ref 65–99)
POTASSIUM: 4.2 mmol/L (ref 3.5–5.1)
SODIUM: 132 mmol/L — AB (ref 135–145)
Total Bilirubin: 0.5 mg/dL (ref 0.3–1.2)
Total Protein: 8 g/dL (ref 6.5–8.1)

## 2015-10-08 LAB — DIFFERENTIAL
BASOS ABS: 0.1 10*3/uL (ref 0–0.1)
BASOS PCT: 1 %
EOS PCT: 10 %
Eosinophils Absolute: 0.8 10*3/uL — ABNORMAL HIGH (ref 0–0.7)
Lymphocytes Relative: 16 %
Lymphs Abs: 1.4 10*3/uL (ref 1.0–3.6)
MONO ABS: 0.8 10*3/uL (ref 0.2–1.0)
Monocytes Relative: 9 %
NEUTROS ABS: 5.5 10*3/uL (ref 1.4–6.5)
NEUTROS PCT: 64 %

## 2015-10-08 LAB — GLUCOSE, CAPILLARY
GLUCOSE-CAPILLARY: 202 mg/dL — AB (ref 65–99)
GLUCOSE-CAPILLARY: 272 mg/dL — AB (ref 65–99)
Glucose-Capillary: 318 mg/dL — ABNORMAL HIGH (ref 65–99)

## 2015-10-08 LAB — CBC
HEMATOCRIT: 42.7 % (ref 40.0–52.0)
HEMOGLOBIN: 14.4 g/dL (ref 13.0–18.0)
MCH: 29.5 pg (ref 26.0–34.0)
MCHC: 33.8 g/dL (ref 32.0–36.0)
MCV: 87.3 fL (ref 80.0–100.0)
Platelets: 268 10*3/uL (ref 150–440)
RBC: 4.89 MIL/uL (ref 4.40–5.90)
RDW: 16.3 % — AB (ref 11.5–14.5)
WBC: 8.6 10*3/uL (ref 3.8–10.6)

## 2015-10-08 LAB — LIPID PANEL
Cholesterol: 189 mg/dL (ref 0–200)
HDL: 30 mg/dL — AB (ref >40)
LDL CALC: UNDETERMINED mg/dL (ref 0–99)
Total CHOL/HDL Ratio: 6.3 RATIO
Triglycerides: 610 mg/dL — ABNORMAL HIGH (ref <150)
VLDL: UNDETERMINED mg/dL (ref 0–40)

## 2015-10-08 LAB — PROTIME-INR
INR: 0.94
Prothrombin Time: 12.8 seconds (ref 11.4–15.0)

## 2015-10-08 LAB — APTT: APTT: 30 s (ref 24–36)

## 2015-10-08 LAB — HEMOGLOBIN A1C: Hgb A1c MFr Bld: 9.4 % — ABNORMAL HIGH (ref 4.0–6.0)

## 2015-10-08 MED ORDER — CLONIDINE HCL 0.1 MG PO TABS
0.1000 mg | ORAL_TABLET | Freq: Two times a day (BID) | ORAL | Status: DC
  Administered 2015-10-09: 10:00:00 0.1 mg via ORAL
  Filled 2015-10-08 (×2): qty 1

## 2015-10-08 MED ORDER — METOPROLOL TARTRATE 50 MG PO TABS
50.0000 mg | ORAL_TABLET | Freq: Two times a day (BID) | ORAL | Status: DC
  Administered 2015-10-09: 10:00:00 50 mg via ORAL
  Filled 2015-10-08: qty 1

## 2015-10-08 MED ORDER — INSULIN GLARGINE 100 UNIT/ML ~~LOC~~ SOLN
75.0000 [IU] | Freq: Every day | SUBCUTANEOUS | Status: DC
  Administered 2015-10-08: 22:00:00 75 [IU] via SUBCUTANEOUS
  Filled 2015-10-08 (×2): qty 0.75

## 2015-10-08 MED ORDER — FUROSEMIDE 40 MG PO TABS
40.0000 mg | ORAL_TABLET | Freq: Every day | ORAL | Status: DC
  Administered 2015-10-09: 40 mg via ORAL
  Filled 2015-10-08: qty 1

## 2015-10-08 MED ORDER — ISOSORBIDE MONONITRATE ER 30 MG PO TB24
60.0000 mg | ORAL_TABLET | Freq: Every day | ORAL | Status: DC
  Administered 2015-10-09: 60 mg via ORAL
  Filled 2015-10-08: qty 2

## 2015-10-08 MED ORDER — SIMVASTATIN 20 MG PO TABS
20.0000 mg | ORAL_TABLET | Freq: Every day | ORAL | Status: DC
  Administered 2015-10-08: 20 mg via ORAL
  Filled 2015-10-08: qty 1

## 2015-10-08 MED ORDER — VITAMIN D3 1.25 MG (50000 UT) PO CAPS
50000.0000 [IU] | ORAL_CAPSULE | ORAL | Status: DC
  Filled 2015-10-08: qty 1

## 2015-10-08 MED ORDER — ENOXAPARIN SODIUM 40 MG/0.4ML ~~LOC~~ SOLN
40.0000 mg | SUBCUTANEOUS | Status: DC
  Administered 2015-10-08: 20:00:00 40 mg via SUBCUTANEOUS
  Filled 2015-10-08: qty 0.4

## 2015-10-08 MED ORDER — ACETAMINOPHEN 325 MG PO TABS
650.0000 mg | ORAL_TABLET | Freq: Four times a day (QID) | ORAL | Status: DC | PRN
  Administered 2015-10-08: 20:00:00 650 mg via ORAL
  Filled 2015-10-08: qty 2

## 2015-10-08 MED ORDER — ASPIRIN EC 325 MG PO TBEC
325.0000 mg | DELAYED_RELEASE_TABLET | Freq: Every day | ORAL | Status: DC
  Administered 2015-10-08 – 2015-10-09 (×2): 325 mg via ORAL
  Filled 2015-10-08 (×2): qty 1

## 2015-10-08 MED ORDER — PANTOPRAZOLE SODIUM 40 MG PO TBEC
40.0000 mg | DELAYED_RELEASE_TABLET | Freq: Two times a day (BID) | ORAL | Status: DC
  Administered 2015-10-08 – 2015-10-09 (×2): 40 mg via ORAL
  Filled 2015-10-08 (×2): qty 1

## 2015-10-08 MED ORDER — CITALOPRAM HYDROBROMIDE 20 MG PO TABS
10.0000 mg | ORAL_TABLET | Freq: Every day | ORAL | Status: DC
  Administered 2015-10-09: 10 mg via ORAL
  Filled 2015-10-08: qty 1

## 2015-10-08 MED ORDER — LORATADINE 10 MG PO TABS
10.0000 mg | ORAL_TABLET | Freq: Every day | ORAL | Status: DC
  Administered 2015-10-09: 10:00:00 10 mg via ORAL
  Filled 2015-10-08: qty 1

## 2015-10-08 MED ORDER — INSULIN ASPART 100 UNIT/ML ~~LOC~~ SOLN
0.0000 [IU] | Freq: Three times a day (TID) | SUBCUTANEOUS | Status: DC
  Administered 2015-10-08: 18:00:00 3 [IU] via SUBCUTANEOUS
  Administered 2015-10-09: 08:00:00 2 [IU] via SUBCUTANEOUS
  Administered 2015-10-09: 13:00:00 7 [IU] via SUBCUTANEOUS
  Filled 2015-10-08: qty 7
  Filled 2015-10-08: qty 2
  Filled 2015-10-08: qty 3

## 2015-10-08 MED ORDER — ALLOPURINOL 100 MG PO TABS
200.0000 mg | ORAL_TABLET | Freq: Every day | ORAL | Status: DC
  Administered 2015-10-09: 200 mg via ORAL
  Filled 2015-10-08: qty 2

## 2015-10-08 NOTE — H&P (Signed)
[facility] NAME: Mike Decker    MR#:  [id number]  DATE OF BIRTH:  Sep 05, 1941  DATE OF ADMISSION:  10/08/2015  PRIMARY CARE PHYSICIAN: Cletis Athens, MD   REQUESTING/REFERRING PHYSICIAN: Archie Balboa  CHIEF COMPLAINT:   Chief Complaint  Patient presents with  . Code Stroke    HISTORY OF PRESENT ILLNESS: Mike Decker  is a 74 y.o. male with a known history of coronary artery disease, hypertension, diabetes, peripheral vascular disease- today morning felt slurred speech so came to emergency room. He denies any headache, numbness or weakness in any limbs, dizziness. Speech is still slurred. Denies any difficulty swallowing.  PAST MEDICAL HISTORY:   Past Medical History  Diagnosis Date  . Arthritis   . Colon polyp   . Diabetes mellitus without complication ([place])   . Hyperlipidemia   . Myocardial infarct ([place])   . Heart disease   . Hypertension   . Atrioventricular canal (AVC)     irregular heart beats  . Diverticulosis   . Barrett esophagus   . Sleep apnea   . Gout   . Vitreoretinal degeneration   . Ocular hypertension   . Hemangioma     liver  . Skin cancer   . Cancer [facility]) 2002    prostate  . Peripheral vascular disease ([place])     PAST SURGICAL HISTORY:  Past Surgical History  Procedure Laterality Date  . Prostate surgery  2002  . Coronary angioplasty with stent placement  2012  . Intraocular lens insertion    . Hernia repair      x2  . Cataract extraction  2011, 2012  . Rotator cuff repair Right   . Nose surgery      submucous resection    SOCIAL HISTORY:  Social History  Substance Use Topics  . Smoking status: Former Smoker -- 20 years    Types: Cigarettes    Quit date: 12/10/1976  . Smokeless tobacco: Never Used  . Alcohol Use: No    FAMILY HISTORY:  Family History  Problem Relation Age of Onset  . Stroke Maternal Grandfather     DRUG ALLERGIES:  Allergies  Allergen Reactions  .  Peanut-Containing Drug Products Anaphylaxis  . Penicillins Hives and Rash    Has patient had a PCN reaction causing immediate rash, facial/tongue/throat swelling, SOB or lightheadedness with hypotension: Yes Has patient had a PCN reaction causing severe rash involving mucus membranes or skin necrosis: No Has patient had a PCN reaction that required hospitalization No Has patient had a PCN reaction occurring within the last 10 years: No If all of the above answers are "NO", then may proceed with Cephalosporin use.  . Bee Venom Swelling  . Influenza Vaccines Hives  . Inh [Isoniazid] Hives  . Kenalog [Triamcinolone Acetonide] Hives  . Nalfon [Fenoprofen Calcium] Hives    REVIEW OF SYSTEMS:   CONSTITUTIONAL: No fever, fatigue or weakness.  EYES: No blurred or double vision.  EARS, NOSE, AND THROAT: No tinnitus or ear pain.  RESPIRATORY: No cough, shortness of breath, wheezing or hemoptysis.  CARDIOVASCULAR: No chest pain, orthopnea, edema.  GASTROINTESTINAL: No nausea, vomiting, diarrhea or abdominal pain.  GENITOURINARY: No dysuria, hematuria.  ENDOCRINE: No polyuria, nocturia,  HEMATOLOGY: No anemia, easy bruising or bleeding SKIN: No rash or lesion. MUSCULOSKELETAL: No joint pain or arthritis.   NEUROLOGIC: No tingling, numbness, weakness. He has slurred speech. PSYCHIATRY: No anxiety or depression.   MEDICATIONS AT HOME:  Prior  to Admission medications   Medication Sig Start Date End Date Taking? Authorizing Provider  acetaminophen (TYLENOL) 500 MG tablet Take 1,000 mg by mouth every 6 (six) hours as needed for mild pain, moderate pain or headache.   Yes Historical Provider, MD  allopurinol (ZYLOPRIM) 100 MG tablet Take 200 mg by mouth daily.   Yes Historical Provider, MD  aspirin 325 MG EC tablet Take 325 mg by mouth daily.   Yes Historical Provider, MD  cetirizine (ZYRTEC) 10 MG tablet Take 10 mg by mouth daily as needed for allergies or rhinitis.    Yes Historical Provider, MD   Cholecalciferol (VITAMIN D3) 50000 UNITS CAPS Take 50,000 Units by mouth every 30 (thirty) days.   Yes Historical Provider, MD  citalopram (CELEXA) 10 MG tablet Take 10 mg by mouth daily.   Yes Historical Provider, MD  cloNIDine (CATAPRES) 0.1 MG tablet Take 0.1 mg by mouth 2 (two) times daily.    Yes Historical Provider, MD  EPINEPHrine (EPI-PEN) 0.3 mg/0.3 mL SOAJ injection Inject into the muscle as needed (anaphylaxis).    Yes Historical Provider, MD  furosemide (LASIX) 40 MG tablet Take 40 mg by mouth daily.   Yes Historical Provider, MD  insulin aspart (NOVOLOG FLEXPEN) 100 UNIT/ML FlexPen Inject 20-32 Units into the skin 2 (two) times daily before lunch and supper. Take per sliding scale. 08/30/15  Yes Historical Provider, MD  insulin glargine (LANTUS) 100 UNIT/ML injection Inject 75 Units into the skin at bedtime.    Yes Historical Provider, MD  isosorbide mononitrate (IMDUR) 60 MG 24 hr tablet Take 60 mg by mouth daily.   Yes Historical Provider, MD  metoprolol (LOPRESSOR) 50 MG tablet Take 50 mg by mouth 2 (two) times daily.   Yes Historical Provider, MD  nitroGLYCERIN (NITROSTAT) 0.4 MG SL tablet Place 0.4 mg under the tongue every 5 (five) minutes x 3 doses as needed for chest pain.    Yes Historical Provider, MD  pantoprazole (PROTONIX) 40 MG tablet Take 40 mg by mouth 2 (two) times daily.   Yes Historical Provider, MD  simvastatin (ZOCOR) 20 MG tablet Take 20 mg by mouth at bedtime.    Yes Historical Provider, MD      PHYSICAL EXAMINATION:   VITAL SIGNS: Blood pressure 174/86, pulse 40, temperature 97.8 F (36.6 C), temperature source Oral, resp. rate 18, height 5\' 7"  (1.702 m), weight 85.6 kg (188 lb 11.4 oz), SpO2 95 %.  GENERAL:  74 y.o.-year-old patient lying in the bed with no acute distress.  EYES: Pupils equal, round, reactive to light and accommodation. No scleral icterus. Extraocular muscles intact.  HEENT: Head atraumatic, normocephalic. Oropharynx and nasopharynx clear.   NECK:  Supple, no jugular venous distention. No thyroid enlargement, no tenderness.  LUNGS: Normal breath sounds bilaterally, no wheezing, rales,rhonchi or crepitation. No use of accessory muscles of respiration.  CARDIOVASCULAR: S1, S2 normal. No murmurs, rubs, or gallops.  ABDOMEN: Soft, nontender, nondistended. Bowel sounds present. No organomegaly or mass.  EXTREMITIES: No pedal edema, cyanosis, or clubbing.  NEUROLOGIC: Cranial nerves II through XII are intact. Muscle strength 5/5 in all extremities. Sensation intact. Gait not checked. Speech is slightly slower. PSYCHIATRIC: The patient is alert and oriented x 3.  SKIN: No obvious rash, lesion, or ulcer.   LABORATORY PANEL:   CBC  Recent Labs Lab 10/08/15 1240  WBC 8.6  HGB 14.4  HCT 42.7  PLT 268  MCV 87.3  MCH 29.5  MCHC 33.8  RDW 16.3*  LYMPHSABS  1.4  MONOABS 0.8  EOSABS 0.8*  BASOSABS 0.1   ------------------------------------------------------------------------------------------------------------------  Chemistries   Recent Labs Lab 10/08/15 1240  NA 132*  K 4.2  CL 97*  CO2 25  GLUCOSE 349*  BUN 42*  CREATININE 1.72*  CALCIUM 9.8  AST 17  ALT 13*  ALKPHOS 84  BILITOT 0.5   ------------------------------------------------------------------------------------------------------------------ estimated creatinine clearance is 38.8 mL/min (by C-G formula based on Cr of 1.72). ------------------------------------------------------------------------------------------------------------------ No results for input(s): TSH, T4TOTAL, T3FREE, THYROIDAB in the last 72 hours.  Invalid input(s): FREET3   Coagulation profile  Recent Labs Lab 10/08/15 1240  INR 0.94   ------------------------------------------------------------------------------------------------------------------- No results for input(s): DDIMER in the last 72  hours. -------------------------------------------------------------------------------------------------------------------  Cardiac Enzymes No results for input(s): CKMB, TROPONINI, MYOGLOBIN in the last 168 hours.  Invalid input(s): CK ------------------------------------------------------------------------------------------------------------------ Invalid input(s): POCBNP  ---------------------------------------------------------------------------------------------------------------  Urinalysis No results found for: COLORURINE, APPEARANCEUR, LABSPEC, PHURINE, GLUCOSEU, HGBUR, BILIRUBINUR, KETONESUR, PROTEINUR, UROBILINOGEN, NITRITE, LEUKOCYTESUR   RADIOLOGY: Ct Head Wo Contrast  10/08/2015  CLINICAL DATA:  Slurred speech, code stroke EXAM: CT HEAD WITHOUT CONTRAST TECHNIQUE: Contiguous axial images were obtained from the base of the skull through the vertex without intravenous contrast. COMPARISON:  None. FINDINGS: No evidence of parenchymal hemorrhage or extra-axial fluid collection. No mass lesion, mass effect, or midline shift. No CT evidence of acute infarction. Mild subcortical white matter and periventricular small vessel ischemic changes, including in the subcortical left parietal lobe (series 2/ image 21). Mild cortical and central atrophy, frontal lobe predominant. Secondary ventricular prominence. Partial opacification of the bilateral ethmoid sinuses. Mastoid air cells are clear. No evidence of calvarial fracture. IMPRESSION: No evidence of acute intracranial abnormality. Atrophy with small vessel ischemic changes. These results were called by telephone at the time of interpretation on 10/08/2015 at 12:35 pm to Dr. Nance Pear, who verbally acknowledged these results. Electronically Signed   By: Julian Hy M.D.   On: 10/08/2015 12:36    EKG: Orders placed or performed during the hospital encounter of 10/08/15  . ED EKG  . ED EKG    IMPRESSION AND PLAN:  * TIA    We will monitor on telemetry, frequent neuro checks.  Get MRI of the brain, echocardiogram, carotid Doppler study.  Speech and swallow evaluation.  * Coronary artery disease  Continue home medications  * Diabetes  Continue medication, keep on insulin sliding scale coverage.  * Hypertension  Currently has possibility of stroke, we will allow permissive hypertension.  Continue home medications.  * Peripheral vascular disease Continue same medication.    All the records are reviewed and case discussed with ED provider. Management plans discussed with the patient, family and they are in agreement.  CODE STATUS: Full    TOTAL TIME TAKING CARE OF THIS PATIENT: 45 minutes.    Vaughan Basta M.D on 10/08/2015   Between 7am to 6pm - Pager - [phone]  After 6pm go to [email or web address] - password [email or web address] [facility]  Office  [phone]  CC: Primary care physician; Cletis Athens, MD   Note: This dictation was prepared with Dragon dictation along with smaller phrase technology. Any transcriptional errors that result from this process are unintentional.

## 2015-10-08 NOTE — ED Notes (Addendum)
Pt states he woke up at 0730 and his wife noticed his speech slowed and slurred. Pt presents with very mild slurring and speech delay. No other complaints at this time.

## 2015-10-08 NOTE — Plan of Care (Signed)
Problem: Discharge/Transitional Outcomes Goal: Barriers To Progression Addressed/Resolved Individualization of Care:   Patient is from home with wife.  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  PMH: CAD, HTN, Diabetes, PVD - controlled with home medications.  Goal: Other Discharge Outcomes/Goals Plan of care progress to goal:  Patient admitted from ED with slurred speech; telemetry monitoring, vitals and neuro checks every 2 hours. NIH score of 1. MRI in progress. Wife at bedside.

## 2015-10-08 NOTE — Progress Notes (Signed)
   10/08/15 1230  Clinical Encounter Type  Visited With Patient and family together  Visit Type Code  Consult/Referral To Chaplain  Chaplain responded to code stroke and spoke with patient as he returned from scan. I found family in waiting room and as soon as it was clear I brought wife and two children to the back. I provided a compassionate presence.   [place] [phone]

## 2015-10-08 NOTE — ED Provider Notes (Signed)
[facility] Emergency Department Provider Note  ____________________________________________  Time seen: 1235  I have reviewed the triage vital signs and the nursing notes.   HISTORY  Chief Complaint No chief complaint on file.   History limited by: Not Limited   HPI Mike Decker is a 74 y.o. male who is brought in by family today because of concern for slurred speech and difficulty with ambulation. Patient states that he woke up roughly 5-1/2 hours ago. He states he thinks these symptoms might of started 5 hours ago. He states that he noticed he was having difficulty walking when he went to get the paper this morning. He did speak some to his wife hours she states she did not necessarily notice slurred speech but she was also busy herself. They noticed the slurred speech 3 hours ago when they got in the car. Patient denies any headache or pain. Denies any recent trauma to his head. Denies any recent chest pain, shortness breath or fevers.  Past Medical History  Diagnosis Date  . Arthritis   . Colon polyp   . Diabetes mellitus without complication   . Hyperlipidemia   . Myocardial infarct   . Heart disease   . Hypertension   . Atrioventricular canal (AVC)     irregular heart beats  . Diverticulosis   . Barrett esophagus   . Sleep apnea   . Gout   . Vitreoretinal degeneration   . Ocular hypertension   . Hemangioma     liver  . Skin cancer   . Cancer 2002    prostate    There are no active problems to display for this patient.   Past Surgical History  Procedure Laterality Date  . Prostate surgery  2002  . Coronary angioplasty with stent placement  2012  . Intraocular lens insertion    . Hernia repair      x2  . Cataract extraction  2011, 2012  . Rotator cuff repair Right   . Nose surgery      submucous resection    Current Outpatient Rx  Name  Route  Sig  Dispense  Refill  . acetaminophen (TYLENOL) 325 MG tablet   Oral   Take  650 mg by mouth every 6 (six) hours as needed.         Marland Kitchen allopurinol (ZYLOPRIM) 100 MG tablet   Oral   Take 200 mg by mouth daily.         Marland Kitchen amLODipine (NORVASC) 5 MG tablet   Oral   Take 5 mg by mouth daily.         Marland Kitchen aspirin 81 MG tablet   Oral   Take 81 mg by mouth daily.         . cetirizine (ZYRTEC) 10 MG tablet   Oral   Take 10 mg by mouth as needed for allergies.         . citalopram (CELEXA) 10 MG tablet   Oral   Take 10 mg by mouth daily.         . cloNIDine (CATAPRES) 0.1 MG tablet   Oral   Take 0.1 mg by mouth daily.         Marland Kitchen EPINEPHrine (EPI-PEN) 0.3 mg/0.3 mL SOAJ injection   Intramuscular   Inject into the muscle as needed.         . insulin glargine (LANTUS) 100 UNIT/ML injection   Subcutaneous   Inject 30 Units into the skin every morning.         Marland Kitchen  insulin glargine (LANTUS) 100 UNIT/ML injection   Subcutaneous   Inject 10 Units into the skin at bedtime.         . isosorbide mononitrate (IMDUR) 60 MG 24 hr tablet   Oral   Take 60 mg by mouth daily.         . metFORMIN (GLUMETZA) 1000 MG (MOD) 24 hr tablet   Oral   Take 1,000 mg by mouth 2 (two) times daily with a meal.         . metoprolol (LOPRESSOR) 50 MG tablet   Oral   Take 50 mg by mouth 2 (two) times daily.         . nitroGLYCERIN (NITROSTAT) 0.4 MG SL tablet   Sublingual   Place 0.4 mg under the tongue every 5 (five) minutes as needed for chest pain.         . pantoprazole (PROTONIX) 40 MG tablet   Oral   Take 40 mg by mouth 2 (two) times daily.         . prasugrel (EFFIENT) 10 MG TABS tablet   Oral   Take 10 mg by mouth daily.         . simvastatin (ZOCOR) 20 MG tablet   Oral   Take 20 mg by mouth daily.           Allergies Peanut-containing drug products; Bee venom; Influenza vaccines; Inh; Kenalog; Nalfon; and Penicillins  No family history on file.  Social History Social History  Substance Use Topics  . Smoking status: Former  Smoker -- 20 years    Types: Cigarettes    Quit date: 12/10/1976  . Smokeless tobacco: Never Used  . Alcohol Use: No    Review of Systems  Constitutional: Negative for fever. Cardiovascular: Negative for chest pain. Respiratory: Negative for shortness of breath. Gastrointestinal: Negative for abdominal pain, vomiting and diarrhea. Genitourinary: Negative for dysuria. Musculoskeletal: Negative for back pain. Skin: Negative for rash. Neurological: Positive for slurred speech and difficulty with walking. 10-point ROS otherwise negative.  ____________________________________________   PHYSICAL EXAM:  VITAL SIGNS:   97.8 F (36.6 C)  73  16   224/84 mmHg  99 %     Constitutional: Alert and oriented. Well appearing and in no distress. Eyes: Conjunctivae are normal. PERRL. Normal extraocular movements. ENT   Head: Normocephalic and atraumatic.   Nose: No congestion/rhinnorhea.   Mouth/Throat: Mucous membranes are moist.   Neck: No stridor. Hematological/Lymphatic/Immunilogical: No cervical lymphadenopathy. Cardiovascular: Normal rate, regular rhythm.  No murmurs, rubs, or gallops. Respiratory: Normal respiratory effort without tachypnea nor retractions. Breath sounds are clear and equal bilaterally. No wheezes/rales/rhonchi. Gastrointestinal: Soft and nontender. No distention.  Genitourinary: Deferred Musculoskeletal: Normal range of motion in all extremities. No joint effusions.  No lower extremity tenderness nor edema. Neurologic:  Speech somewhat slurred however easily understandable no pronator drift. Strength 5 out of 5 in upper and lower extremities. Sensation intact.. No gross focal neurologic deficits are appreciated.  Skin:  Skin is warm, dry and intact. No rash noted. Psychiatric: Mood and affect are normal. Speech and behavior are normal. Patient exhibits appropriate insight and judgment.  ____________________________________________    LABS  (pertinent positives/negatives)   Labs reviewed ____________________________________________   EKG  INance Pear, attending physician, personally viewed and interpreted this EKG  EKG Time: 1239 Rate: 70 Rhythm: Normal sinus rhythm Axis: normal Intervals: qtc 426 QRS: narrow ST changes: no st elevation Impression: normal ekg   ____________________________________________    RADIOLOGY  CT head IMPRESSION: No evidence of acute intracranial abnormality.  Atrophy with small vessel ischemic changes.  I, Jevan Gaunt, personally viewed and evaluated these images (plain radiographs) as part of my medical decision making. ____________________________________________   PROCEDURES  Procedure(s) performed: None  Critical Care performed: Yes, see critical care note(s)  CRITICAL CARE Performed by: Nance Pear   Total critical care time: 30 minutes  Critical care time was exclusive of separately billable procedures and treating other patients.  Critical care was necessary to treat or prevent imminent or life-threatening deterioration.  Critical care was time spent personally by me on the following activities: development of treatment plan with patient and/or surrogate as well as nursing, discussions with consultants, evaluation of patient's response to treatment, examination of patient, obtaining history from patient or surrogate, ordering and performing treatments and interventions, ordering and review of laboratory studies, ordering and review of radiographic studies, pulse oximetry and re-evaluation of patient's condition.  ____________________________________________   INITIAL IMPRESSION / ASSESSMENT AND PLAN / ED COURSE  Pertinent labs & imaging results that were available during my care of the patient were reviewed by me and considered in my medical decision making (see chart for details).  Patient presented to the emergency department today because  of concerns for some slurred speech and lower extremity weakness. On exam patient does have some slurred speech however you are able to understand him. Gait testing was deferred. Head CT was negative. Patient was a code stroke. Unfortunately the patient thinks these symptoms might of started roughly 5 hours ago. Thus while TPA was considered I do think he is outside of the appropriate time window. Given the concern for stroke however he does warrant admission for further stroke work up.  ____________________________________________   FINAL CLINICAL IMPRESSION(S) / ED DIAGNOSES  Final diagnoses:  Slurred speech     Nance Pear, MD 10/09/15 [phone]

## 2015-10-09 ENCOUNTER — Observation Stay
Admit: 2015-10-09 | Discharge: 2015-10-09 | Disposition: A | Discharge status: Home / Self Care | Payer: Medicare Other | Attending: Internal Medicine | Admitting: Internal Medicine

## 2015-10-09 ENCOUNTER — Encounter: Payer: Self-pay | Admitting: Radiology

## 2015-10-09 ENCOUNTER — Observation Stay: Payer: Medicare Other

## 2015-10-09 DIAGNOSIS — I639 Cerebral infarction, unspecified: Secondary | ICD-10-CM | POA: Diagnosis not present

## 2015-10-09 DIAGNOSIS — G459 Transient cerebral ischemic attack, unspecified: Secondary | ICD-10-CM | POA: Diagnosis not present

## 2015-10-09 DIAGNOSIS — I739 Peripheral vascular disease, unspecified: Secondary | ICD-10-CM | POA: Diagnosis not present

## 2015-10-09 DIAGNOSIS — R4781 Slurred speech: Secondary | ICD-10-CM | POA: Diagnosis not present

## 2015-10-09 DIAGNOSIS — I63319 Cerebral infarction due to thrombosis of unspecified middle cerebral artery: Secondary | ICD-10-CM | POA: Diagnosis not present

## 2015-10-09 DIAGNOSIS — E119 Type 2 diabetes mellitus without complications: Secondary | ICD-10-CM | POA: Diagnosis not present

## 2015-10-09 DIAGNOSIS — I10 Essential (primary) hypertension: Secondary | ICD-10-CM | POA: Diagnosis not present

## 2015-10-09 DIAGNOSIS — I6359 Cerebral infarction due to unspecified occlusion or stenosis of other cerebral artery: Secondary | ICD-10-CM | POA: Diagnosis not present

## 2015-10-09 LAB — CBC
HEMATOCRIT: 42.9 % (ref 40.0–52.0)
HEMOGLOBIN: 14.3 g/dL (ref 13.0–18.0)
MCH: 28.9 pg (ref 26.0–34.0)
MCHC: 33.4 g/dL (ref 32.0–36.0)
MCV: 86.7 fL (ref 80.0–100.0)
Platelets: 264 10*3/uL (ref 150–440)
RBC: 4.95 MIL/uL (ref 4.40–5.90)
RDW: 15.9 % — ABNORMAL HIGH (ref 11.5–14.5)
WBC: 10.8 10*3/uL — AB (ref 3.8–10.6)

## 2015-10-09 LAB — BASIC METABOLIC PANEL
ANION GAP: 11 (ref 5–15)
BUN: 37 mg/dL — ABNORMAL HIGH (ref 6–20)
CHLORIDE: 104 mmol/L (ref 101–111)
CO2: 23 mmol/L (ref 22–32)
Calcium: 9.6 mg/dL (ref 8.9–10.3)
Creatinine, Ser: 1.56 mg/dL — ABNORMAL HIGH (ref 0.61–1.24)
GFR calc non Af Amer: 42 mL/min — ABNORMAL LOW (ref >60)
GFR, EST AFRICAN AMERICAN: 48 mL/min — AB (ref >60)
Glucose, Bld: 141 mg/dL — ABNORMAL HIGH (ref 65–99)
POTASSIUM: 3.7 mmol/L (ref 3.5–5.1)
Sodium: 138 mmol/L (ref 135–145)

## 2015-10-09 LAB — GLUCOSE, CAPILLARY
Glucose-Capillary: 162 mg/dL — ABNORMAL HIGH (ref 65–99)
Glucose-Capillary: 308 mg/dL — ABNORMAL HIGH (ref 65–99)

## 2015-10-09 MED ORDER — CLONIDINE HCL 0.1 MG PO TABS: 0.1000 mg | ORAL_TABLET | Freq: Two times a day (BID) | ORAL | Status: DC

## 2015-10-09 MED ORDER — CLOPIDOGREL BISULFATE 75 MG PO TABS: 75.0000 mg | ORAL_TABLET | Freq: Every day | ORAL | Status: DC

## 2015-10-09 MED ORDER — ASPIRIN 81 MG PO CHEW: 81.0000 mg | CHEWABLE_TABLET | Freq: Every day | ORAL | Status: DC

## 2015-10-09 MED ORDER — IOHEXOL 350 MG/ML SOLN
75.0000 mL | Freq: Once | INTRAVENOUS | Status: AC | PRN
  Administered 2015-10-09: 11:00:00 75 mL via INTRAVENOUS

## 2015-10-09 MED ORDER — ISOSORBIDE MONONITRATE ER 120 MG PO TB24: 120.0000 mg | ORAL_TABLET | Freq: Every day | ORAL | Status: DC

## 2015-10-09 MED ORDER — CLOPIDOGREL BISULFATE 75 MG PO TABS
75.0000 mg | ORAL_TABLET | Freq: Every day | ORAL | Status: DC
  Administered 2015-10-09: 75 mg via ORAL
  Filled 2015-10-09: qty 1

## 2015-10-09 NOTE — Progress Notes (Signed)
Physical Therapy Evaluation Patient Details Name: Mike Decker MRN: [id number] DOB: 06-12-1941 Today's Date: 10/09/2015   History of Present Illness  Patient is a 74 y.o. male admitted on 29 Oct. for complaints of slurred speech. Diagnosed with TIA; MRI findings include acute non-hemorrhagic R MCA lenticulostriate infarction. PMH includes CAD, HTN, DM, and PVD.   Clinical Impression  Patient is a pleasant male who prefers to be called "Mike Decker." Patient is a previously independent male who lives at home with his wife. On evaluation, patient demonstrated independence with bed mobility, transfers, and gait; however, he was limited in activity tolerance, secondary to BP, increasing from 183/86 at rest to 211/83 following activity. 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    Follow Up Recommendations No PT follow up    Equipment Recommendations  None recommended by PT    Recommendations for Other Services       Precautions / Restrictions Precautions Precautions: None Restrictions Weight Bearing Restrictions: No      Mobility  Bed Mobility Overal bed mobility: Independent             General bed mobility comments: Patient performs rolling and supine to sit independently.  Transfers Overall transfer level: Independent               General transfer comment: Patient is independent with transfers.  Ambulation/Gait Ambulation/Gait assistance: Supervision Ambulation Distance (Feet): 30 Feet Assistive device: None Gait Pattern/deviations: WFL(Within Functional Limits)     General Gait Details: Patient ambulates at decreased cadence with slight unsteadiness but no LOB. Patient's BP fluctuated from 183/86 at rest to 211/83 post-activity. Patient's ambulation limited due to increasing BP.  Stairs            Wheelchair Mobility     Modified Rankin (Stroke Patients Only)       Balance Overall balance assessment: Independent                                           Pertinent Vitals/Pain Pain Assessment: 0-10 Pain Score: 2  Pain Location: R anterior thigh Pain Descriptors / Indicators: Aching Pain Intervention(s): Limited activity within patient's tolerance;Monitored during session    Home Living Family/patient expects to be discharged to:: Private residence Living Arrangements: Spouse/significant other Available Help at Discharge: Family Type of Home: House Home Access: Stairs to enter Entrance Stairs-Rails: None Technical brewer of Steps: 4 Home Layout: One level Home Equipment: None      Prior Function Level of Independence: Independent               Hand Dominance        Extremity/Trunk Assessment   Upper Extremity Assessment: Overall WFL for tasks assessed           Lower Extremity Assessment: Overall WFL for tasks assessed         Communication   Communication: No difficulties;Other (comment) (Slightly slowed speech per report)  Cognition Arousal/Alertness: Awake/alert Behavior During Therapy: WFL for tasks assessed/performed Overall Cognitive Status: Within Functional Limits for tasks assessed                      General Comments      Exercises        Assessment/Plan    PT Assessment  Patient needs continued PT services  PT Diagnosis Difficulty walking   PT Problem List Decreased activity tolerance;Cardiopulmonary status limiting activity  PT Treatment Interventions Gait training;Stair training;Patient/family education   PT Goals (Current goals can be found in the Care Plan section) Acute Rehab PT Goals Patient Stated Goal: "To go home." PT Goal Formulation: With patient Time For Goal Achievement: 10/23/15 Potential to Achieve Goals: Good    Frequency Min 2X/week   Barriers to discharge        Co-evaluation                End of Session Equipment Utilized During Treatment: Gait belt Activity Tolerance: Patient tolerated treatment well Patient left: in chair;with call bell/phone within reach;with chair alarm set Nurse Communication: Mobility status    Functional Limitation: Mobility: Walking and moving around Mobility: Walking and Moving Around Current Status [phone]): At least 20 percent but less than 40 percent impaired, limited or restricted Mobility: Walking and Moving Around Goal Status [phone]): 0 percent impaired, limited or restricted    Time: 0820-0845 PT Time Calculation (min) (ACUTE ONLY): 25 min   Charges:   PT Evaluation $Initial PT Evaluation Tier I: 1 Procedure     PT G Codes:   PT G-Codes **NOT FOR INPATIENT CLASS** Functional Limitation: Mobility: Walking and moving around Mobility: Walking and Moving Around Current Status [phone]): At least 20 percent but less than 40 percent impaired, limited or restricted Mobility: Walking and Moving Around Goal Status [phone]): 0 percent impaired, limited or restricted    Dorice Lamas, PT, DPT 10/09/2015, 9:37 AM

## 2015-10-09 NOTE — Consult Note (Signed)
Reason for Consult: stroke Referring Physician: Dr. Benjie Karvonen Mike Decker is an 74 y.o. male.  HPI: 74 yo RHD M presents to [facility] after waking up with slurred speech.  He was not a tPA candidate due to unknown onset time.  Pt denies any focal weakness but does state he has problems walking.  He has never had this before.  Past Medical History  Diagnosis Date  . Arthritis   . Colon polyp   . Diabetes mellitus without complication ([place])   . Hyperlipidemia   . Myocardial infarct ([place])   . Heart disease   . Hypertension   . Atrioventricular canal (AVC)     irregular heart beats  . Diverticulosis   . Barrett esophagus   . Sleep apnea   . Gout   . Vitreoretinal degeneration   . Ocular hypertension   . Hemangioma     liver  . Skin cancer   . Cancer [facility]) 2002    prostate  . Peripheral vascular disease [facility])     Past Surgical History  Procedure Laterality Date  . Prostate surgery  2002  . Coronary angioplasty with stent placement  2012  . Intraocular lens insertion    . Hernia repair      x2  . Cataract extraction  2011, 2012  . Rotator cuff repair Right   . Nose surgery      submucous resection    Family History  Problem Relation Age of Onset  . Stroke Maternal Grandfather     Social History:  reports that he quit smoking about 38 years ago. His smoking use included Cigarettes. He quit after 20 years of use. He has never used smokeless tobacco. He reports that he does not drink alcohol or use illicit drugs.  Allergies:  Allergies  Allergen Reactions  . Peanut-Containing Drug Products Anaphylaxis  . Penicillins Hives and Rash    Has patient had a PCN reaction causing immediate rash, facial/tongue/throat swelling, SOB or lightheadedness with hypotension: Yes Has patient had a PCN reaction causing severe rash involving mucus membranes or skin necrosis: No Has patient had a PCN reaction that required hospitalization No Has patient had a PCN reaction occurring within  the last 10 years: No If all of the above answers are "NO", then may proceed with Cephalosporin use.  . Bee Venom Swelling  . Influenza Vaccines Hives  . Inh [Isoniazid] Hives  . Kenalog [Triamcinolone Acetonide] Hives  . Nalfon [Fenoprofen Calcium] Hives    Medications: personally reviewed by me as per chart  Results for orders placed or performed during the hospital encounter of 10/08/15 (from the past 48 hour(s))  Protime-INR     Status: None   Collection Time: 10/08/15 12:40 PM  Result Value Ref Range   Prothrombin Time 12.8 11.4 - 15.0 seconds   INR 0.94   APTT     Status: None   Collection Time: 10/08/15 12:40 PM  Result Value Ref Range   aPTT 30 24 - 36 seconds  CBC     Status: Abnormal   Collection Time: 10/08/15 12:40 PM  Result Value Ref Range   WBC 8.6 3.8 - 10.6 K/uL   RBC 4.89 4.40 - 5.90 MIL/uL   Hemoglobin 14.4 13.0 - 18.0 g/dL   HCT 42.7 40.0 - 52.0 %   MCV 87.3 80.0 - 100.0 fL   MCH 29.5 26.0 - 34.0 pg   MCHC 33.8 32.0 - 36.0 g/dL   RDW 16.3 (H) 11.5 - 14.5 %  Platelets 268 150 - 440 K/uL  Differential     Status: Abnormal   Collection Time: 10/08/15 12:40 PM  Result Value Ref Range   Neutrophils Relative % 64 %   Neutro Abs 5.5 1.4 - 6.5 K/uL   Lymphocytes Relative 16 %   Lymphs Abs 1.4 1.0 - 3.6 K/uL   Monocytes Relative 9 %   Monocytes Absolute 0.8 0.2 - 1.0 K/uL   Eosinophils Relative 10 %   Eosinophils Absolute 0.8 (H) 0 - 0.7 K/uL   Basophils Relative 1 %   Basophils Absolute 0.1 0 - 0.1 K/uL  Comprehensive metabolic panel     Status: Abnormal   Collection Time: 10/08/15 12:40 PM  Result Value Ref Range   Sodium 132 (L) 135 - 145 mmol/L   Potassium 4.2 3.5 - 5.1 mmol/L   Chloride 97 (L) 101 - 111 mmol/L   CO2 25 22 - 32 mmol/L   Glucose, Bld 349 (H) 65 - 99 mg/dL   BUN 42 (H) 6 - 20 mg/dL   Creatinine, Ser 1.72 (H) 0.61 - 1.24 mg/dL   Calcium 9.8 8.9 - 10.3 mg/dL   Total Protein 8.0 6.5 - 8.1 g/dL   Albumin 4.7 3.5 - 5.0 g/dL   AST 17  15 - 41 U/L   ALT 13 (L) 17 - 63 U/L   Alkaline Phosphatase 84 38 - 126 U/L   Total Bilirubin 0.5 0.3 - 1.2 mg/dL   GFR calc non Af Amer 37 (L) >60 mL/min   GFR calc Af Amer 43 (L) >60 mL/min    Comment: (NOTE) The eGFR has been calculated using the CKD EPI equation. This calculation has not been validated in all clinical situations. eGFR's persistently <60 mL/min signify possible Chronic Kidney Disease.    Anion gap 10 5 - 15  Hemoglobin A1c     Status: Abnormal   Collection Time: 10/08/15 12:40 PM  Result Value Ref Range   Hgb A1c MFr Bld 9.4 (H) 4.0 - 6.0 %  Lipid panel     Status: Abnormal   Collection Time: 10/08/15 12:40 PM  Result Value Ref Range   Cholesterol 189 0 - 200 mg/dL   Triglycerides 610 (H) <150 mg/dL   HDL 30 (L) >40 mg/dL   Total CHOL/HDL Ratio 6.3 RATIO   VLDL UNABLE TO CALCULATE IF TRIGLYCERIDE OVER 400 mg/dL 0 - 40 mg/dL   LDL Cholesterol UNABLE TO CALCULATE IF TRIGLYCERIDE OVER 400 mg/dL 0 - 99 mg/dL    Comment:        Total Cholesterol/HDL:CHD Risk Coronary Heart Disease Risk Table                     Men   Women  1/2 Average Risk   3.4   3.3  Average Risk       5.0   4.4  2 X Average Risk   9.6   7.1  3 X Average Risk  23.4   11.0        Use the calculated Patient Ratio above and the CHD Risk Table to determine the patient's CHD Risk.        ATP III CLASSIFICATION (LDL):  <100     mg/dL   Optimal  100-129  mg/dL   Near or Above                    Optimal  130-159  mg/dL   Borderline  160-189  mg/dL  High  >190     mg/dL   Very High   Glucose, capillary     Status: Abnormal   Collection Time: 10/08/15 12:51 PM  Result Value Ref Range   Glucose-Capillary 318 (H) 65 - 99 mg/dL  Glucose, capillary     Status: Abnormal   Collection Time: 10/08/15  5:18 PM  Result Value Ref Range   Glucose-Capillary 202 (H) 65 - 99 mg/dL  Glucose, capillary     Status: Abnormal   Collection Time: 10/08/15  9:34 PM  Result Value Ref Range    Glucose-Capillary 272 (H) 65 - 99 mg/dL  Basic metabolic panel     Status: Abnormal   Collection Time: 10/09/15  4:28 AM  Result Value Ref Range   Sodium 138 135 - 145 mmol/L   Potassium 3.7 3.5 - 5.1 mmol/L   Chloride 104 101 - 111 mmol/L   CO2 23 22 - 32 mmol/L   Glucose, Bld 141 (H) 65 - 99 mg/dL   BUN 37 (H) 6 - 20 mg/dL   Creatinine, Ser 1.56 (H) 0.61 - 1.24 mg/dL   Calcium 9.6 8.9 - 10.3 mg/dL   GFR calc non Af Amer 42 (L) >60 mL/min   GFR calc Af Amer 48 (L) >60 mL/min    Comment: (NOTE) The eGFR has been calculated using the CKD EPI equation. This calculation has not been validated in all clinical situations. eGFR's persistently <60 mL/min signify possible Chronic Kidney Disease.    Anion gap 11 5 - 15  CBC     Status: Abnormal   Collection Time: 10/09/15  4:28 AM  Result Value Ref Range   WBC 10.8 (H) 3.8 - 10.6 K/uL   RBC 4.95 4.40 - 5.90 MIL/uL   Hemoglobin 14.3 13.0 - 18.0 g/dL   HCT 42.9 40.0 - 52.0 %   MCV 86.7 80.0 - 100.0 fL   MCH 28.9 26.0 - 34.0 pg   MCHC 33.4 32.0 - 36.0 g/dL   RDW 15.9 (H) 11.5 - 14.5 %   Platelets 264 150 - 440 K/uL  Glucose, capillary     Status: Abnormal   Collection Time: 10/09/15  8:00 AM  Result Value Ref Range   Glucose-Capillary 162 (H) 65 - 99 mg/dL    Ct Head Wo Contrast  10/08/2015  CLINICAL DATA:  Slurred speech, code stroke EXAM: CT HEAD WITHOUT CONTRAST TECHNIQUE: Contiguous axial images were obtained from the base of the skull through the vertex without intravenous contrast. COMPARISON:  None. FINDINGS: No evidence of parenchymal hemorrhage or extra-axial fluid collection. No mass lesion, mass effect, or midline shift. No CT evidence of acute infarction. Mild subcortical white matter and periventricular small vessel ischemic changes, including in the subcortical left parietal lobe (series 2/ image 21). Mild cortical and central atrophy, frontal lobe predominant. Secondary ventricular prominence. Partial opacification of the  bilateral ethmoid sinuses. Mastoid air cells are clear. No evidence of calvarial fracture. IMPRESSION: No evidence of acute intracranial abnormality. Atrophy with small vessel ischemic changes. These results were called by telephone at the time of interpretation on 10/08/2015 at 12:35 pm to Dr. Nance Pear, who verbally acknowledged these results. Electronically Signed   By: Julian Hy M.D.   On: 10/08/2015 12:36   Mr Brain Wo Contrast  10/08/2015  CLINICAL DATA:  Slurred speech.  Suspected stroke. EXAM: MRI HEAD WITHOUT CONTRAST TECHNIQUE: Multiplanar, multiecho pulse sequences of the brain and surrounding structures were obtained without intravenous contrast. COMPARISON:  CT earlier in  the the day. FINDINGS: Acute RIGHT MCA lenticulostriate territory infarction, nonhemorrhagic, affects the posterior lentiform nucleus, and regional white matter. No other areas of acute infarction are observed. No mass lesion. Prominence of the extra-axial CSF spaces reflects atrophy. Hydrocephalus ex vacuo. Moderately advanced chronic microvascular ischemic change throughout the subcortical greater than periventricular white matter. No abnormality of the pituitary or cerebellar tonsils. Flow voids are maintained in the internal carotid arteries, basilar artery, and both vertebral arteries. Incompletely evaluated is the proximal M1 segment, RIGHT MCA, which I suspect is narrowed; recommend MRA intracranial for further evaluation. Mild chronic sinus disease. BILATERAL cataract extraction. No mastoid fluid. No osseous findings. Compared with recent CT, the infarct is difficult to visualize. IMPRESSION: Acute, nonhemorrhagic, RIGHT MCA lenticulostriate infarction affecting the lateral and posterior lentiform nucleus and regional white matter. Recommend MRA intracranial to evaluate for proximal RIGHT MCA stenosis. Atrophy and small vessel disease. Electronically Signed   By: Staci Righter M.D.   On: 10/08/2015 20:07    US Carotid Bilateral  10/08/2015  CLINICAL DATA:  Stroke EXAM: BILATERAL CAROTID DUPLEX ULTRASOUND TECHNIQUE: Pearline Cables scale imaging, color Doppler and duplex ultrasound were performed of bilateral carotid and vertebral arteries in the neck. COMPARISON:  None. FINDINGS: Criteria: Quantification of carotid stenosis is based on velocity parameters that correlate the residual internal carotid diameter with NASCET-based stenosis levels, using the diameter of the distal internal carotid lumen as the denominator for stenosis measurement. The following velocity measurements were obtained: RIGHT ICA:  165/34 cm/sec CCA:  93/23 cm/sec SYSTOLIC ICA/CCA RATIO:  5.57 DIASTOLIC ICA/CCA RATIO:  3.22 ECA:  138 cm/sec LEFT ICA:  78/21 cm/sec CCA:  02/54 cm/sec SYSTOLIC ICA/CCA RATIO:  2.70 DIASTOLIC ICA/CCA RATIO:  6.23 ECA:  74 cm/sec RIGHT CAROTID ARTERY: Generalized intimal thickening distal common carotid artery. Soft plaque in the bulb and internal carotid artery. RIGHT VERTEBRAL ARTERY:  Antegrade flow LEFT CAROTID ARTERY: Intimal thickening distal common carotid artery with soft plaque in the bulb extending into the proximal internal carotid arteries. LEFT VERTEBRAL ARTERY:  Antegrade flow IMPRESSION: Estimate of 50-69% narrowing on the right. No significant hemodynamic narrowing on the left.More precise estimate of narrowing could be obtained with CTA if indicated. Electronically Signed   By: Skipper Cliche M.D.   On: 10/08/2015 17:03    Review of Systems  Constitutional: Negative.   HENT: Negative.   Eyes: Negative.   Respiratory: Negative.   Cardiovascular: Negative.   Gastrointestinal: Negative.   Genitourinary: Negative.   Musculoskeletal: Negative.   Skin: Negative.   Neurological: Positive for speech change. Negative for dizziness, tingling, tremors, sensory change, focal weakness, seizures and loss of consciousness.  Psychiatric/Behavioral: Negative.    Blood pressure 190/82, pulse 113, temperature  98 F (36.7 C), temperature source Oral, resp. rate 20, height '5\' 7"'  (1.702 m), weight 85.6 kg (188 lb 11.4 oz), SpO2 95 %. Physical Exam  Constitutional: He is oriented to person, place, and time. He appears well-developed and well-nourished. No distress.  HENT:  Head: Normocephalic and atraumatic.  Right Ear: External ear normal.  Left Ear: External ear normal.  Nose: Nose normal.  Mouth/Throat: Oropharynx is clear and moist.  Eyes: Conjunctivae and EOM are normal. Pupils are equal, round, and reactive to light.  Neck: Normal range of motion. Neck supple.  Cardiovascular: Normal rate, regular rhythm, normal heart sounds and intact distal pulses.   Respiratory: Effort normal and breath sounds normal. He has no wheezes.  GI: Soft. Bowel sounds are normal.  Musculoskeletal: Normal  range of motion.  Neurological: He is alert and oriented to person, place, and time. He has normal reflexes. He displays normal reflexes. No cranial nerve deficit. He exhibits normal muscle tone. Coordination normal.  Mild dysarthria L drift of UE  Skin: Skin is warm and dry. He is not diaphoretic.   MRI personally reviewed by me and shows small R IC lacunar infarct, trace white matter changes  Assessment/Plan: 1.  Acute R IC lacunar infarct-  Etiology is uncontrolled HLD, HTN and DM;  Mildly symptomatic and expected to resolve -  Continue ASA 46m daily x 3 months then stop -  Start plavix 767mdaily -  Increase Zocor for goal LDL < 70 -  Needs better BP control with goal < 130/80 which he can start treating immediately -  Needs better DM control with goal Hem A1c < 7 -  Pt encouraged to exercise 3x week -  Will sign off, please call with questions -  [facility]  Zahli Vetsch, MAChical0/30/2016, 11:55 AM

## 2015-10-09 NOTE — Progress Notes (Signed)
Discussed discharge instructions and medications with pt and his wife.  IV removed per policy and pt transported home via car by wife.  Clarise Cruz, RN

## 2015-10-09 NOTE — Plan of Care (Signed)
Problem: Discharge/Transitional Outcomes Goal: Other Discharge Outcomes/Goals Outcome: Progressing Plan of Care Progress to Goal:   Pt has reported headache x1 during shift. No neuro deficits observed. Pt had difficulty sleeping during shift. Pt is concerned about the future and the test results. No other signs of distress noted. Will continue to monitor.

## 2015-10-09 NOTE — Discharge Summary (Addendum)
[place] [facility] [place] NAME: Mike Decker    MR#:  [id number]  DATE OF BIRTH:  08/11/1941  DATE OF ADMISSION:  10/08/2015 ADMITTING PHYSICIAN: Vaughan Basta, MD  DATE OF DISCHARGE: 10/09/2015   PRIMARY CARE PHYSICIAN: Cletis Athens, MD    ADMISSION DIAGNOSIS:  Slurred speech [R47.81] CVA (cerebral infarction) [I63.9]  DISCHARGE DIAGNOSIS:  Principal Problem:   TIA (transient ischemic attack)   SECONDARY DIAGNOSIS:   Past Medical History  Diagnosis Date  . Arthritis   . Colon polyp   . Diabetes mellitus without complication ([place])   . Hyperlipidemia   . Myocardial infarct ([place])   . Heart disease   . Hypertension   . Atrioventricular canal (AVC)     irregular heart beats  . Diverticulosis   . Barrett esophagus   . Sleep apnea   . Gout   . Vitreoretinal degeneration   . Ocular hypertension   . Hemangioma     liver  . Skin cancer   . Cancer [facility]) 2002    prostate  . Peripheral vascular disease [facility])     HOSPITAL COURSE:   73 year old male with known history of CAD, essential hypertension diabetes who presents with slurred speech. Further details please refer to H&P.  1. Acute R IC lacunar infarct- Etiology is uncontrolled HLD, HTN and DM; Mildly symptomatic and expected to resolve He will Continue ASA 81mg  daily x 3 months then stop and was Started on plavix 75mg  daily. He was seen by physical therapy and does not need further physical therapy follow-up. He will need to see Dr. Lucky Cowboy for repeat ultrasound of his carotids and may need evaluation for CEA in the future. Patient is intolerant to high-dose statin and therefore will be continued on current statin dose.  2. Essential hypertension: Patient's blood pressure is not adequately controlled. Imdur was increased. Patient's ideal blood pressure would be less than 130/80.  3. Diabetes type 2: Patient will need ongoing follow-up with his endocrinologist,  Dr Gabriel Carina.  He will continue his home medications.  4. Peripheral vascular disease: Patient is on aspirin, Plavix and statin. He will need a follow-up with vascular surgery.  5. History of CAD: Continue metoprolol, aspirin, statin and nitroglycerin when necessary.    DISCHARGE CONDITIONS AND DIET:  discharged home in stable condition on a heart healthy/diabetic diet  CONSULTS OBTAINED:  Treatment Team:  Evaristo Bury, MD Valora Corporal, MD  DRUG ALLERGIES:   Allergies  Allergen Reactions  . Peanut-Containing Drug Products Anaphylaxis  . Penicillins Hives and Rash    Has patient had a PCN reaction causing immediate rash, facial/tongue/throat swelling, SOB or lightheadedness with hypotension: Yes Has patient had a PCN reaction causing severe rash involving mucus membranes or skin necrosis: No Has patient had a PCN reaction that required hospitalization No Has patient had a PCN reaction occurring within the last 10 years: No If all of the above answers are "NO", then may proceed with Cephalosporin use.  . Bee Venom Swelling  . Influenza Vaccines Hives  . Inh [Isoniazid] Hives  . Kenalog [Triamcinolone Acetonide] Hives  . Nalfon [Fenoprofen Calcium] Hives    DISCHARGE MEDICATIONS:   Current Discharge Medication List    START taking these medications   Details  aspirin (ASPIRIN CHILDRENS) 81 MG chewable tablet Chew 1 tablet (81 mg total) by mouth daily. Qty: 120 tablet, Refills: 0   PLAVIX 75 mg daily    CONTINUE these medications which have CHANGED  Details  cloNIDine (CATAPRES) 0.1 MG tablet Take 1 tablet (0.1 mg total) by mouth 2 (two) times daily. Qty: 60 tablet, Refills: 11    isosorbide mononitrate (IMDUR) 120 MG 24 hr tablet Take 1 tablet (120 mg total) by mouth daily. Qty: 30 tablet, Refills: 0      CONTINUE these medications which have NOT CHANGED   Details  acetaminophen (TYLENOL) 500 MG tablet Take 1,000 mg by mouth every 6 (six) hours as needed for mild  pain, moderate pain or headache.    allopurinol (ZYLOPRIM) 100 MG tablet Take 200 mg by mouth daily.    cetirizine (ZYRTEC) 10 MG tablet Take 10 mg by mouth daily as needed for allergies or rhinitis.     Cholecalciferol (VITAMIN D3) 50000 UNITS CAPS Take 50,000 Units by mouth every 30 (thirty) days.    citalopram (CELEXA) 10 MG tablet Take 10 mg by mouth daily.    EPINEPHrine (EPI-PEN) 0.3 mg/0.3 mL SOAJ injection Inject into the muscle as needed (anaphylaxis).     furosemide (LASIX) 40 MG tablet Take 40 mg by mouth daily.    insulin aspart (NOVOLOG FLEXPEN) 100 UNIT/ML FlexPen Inject 20-32 Units into the skin 2 (two) times daily before lunch and supper. Take per sliding scale.    insulin glargine (LANTUS) 100 UNIT/ML injection Inject 75 Units into the skin at bedtime.     metoprolol (LOPRESSOR) 50 MG tablet Take 50 mg by mouth 2 (two) times daily.    nitroGLYCERIN (NITROSTAT) 0.4 MG SL tablet Place 0.4 mg under the tongue every 5 (five) minutes x 3 doses as needed for chest pain.     pantoprazole (PROTONIX) 40 MG tablet Take 40 mg by mouth 2 (two) times daily.    simvastatin (ZOCOR) 20 MG tablet Take 20 mg by mouth at bedtime.       STOP taking these medications     aspirin 325 MG EC tablet               Today   CHIEF COMPLAINT:  Patient is doing well this morning. Patient slurred speech is essentially resolved.   VITAL SIGNS:  Blood pressure 190/82, pulse 113, temperature 98 F (36.7 C), temperature source Oral, resp. rate 20, height 5\' 7"  (1.702 m), weight 85.6 kg (188 lb 11.4 oz), SpO2 95 %.   REVIEW OF SYSTEMS:  Review of Systems  Constitutional: Negative for fever, chills and malaise/fatigue.  HENT: Negative for sore throat.   Eyes: Negative for blurred vision.  Respiratory: Negative for cough, hemoptysis, shortness of breath and wheezing.   Cardiovascular: Negative for chest pain, palpitations and leg swelling.  Gastrointestinal: Negative for nausea,  vomiting, abdominal pain, diarrhea and blood in stool.  Genitourinary: Negative for dysuria.  Musculoskeletal: Negative for back pain.  Neurological: Negative for dizziness, tremors and headaches.  Endo/Heme/Allergies: Does not bruise/bleed easily.     PHYSICAL EXAMINATION:  GENERAL:  75 y.o.-year-old patient lying in the bed with no acute distress.  NECK:  Supple, no jugular venous distention. No thyroid enlargement, no tenderness.  LUNGS: Normal breath sounds bilaterally, no wheezing, rales,rhonchi  No use of accessory muscles of respiration.  CARDIOVASCULAR: S1, S2 normal. No murmurs, rubs, or gallops.  ABDOMEN: Soft, non-tender, non-distended. Bowel sounds present. No organomegaly or mass.  EXTREMITIES: No pedal edema, cyanosis, or clubbing.  PSYCHIATRIC: The patient is alert and oriented x 3.  SKIN: No obvious rash, lesion, or ulcer.   DATA REVIEW:   CBC  Recent Labs Lab 10/09/15 [phone]  WBC 10.8*  HGB 14.3  HCT 42.9  PLT 264    Chemistries   Recent Labs Lab 10/08/15 1240 10/09/15 0428  NA 132* 138  K 4.2 3.7  CL 97* 104  CO2 25 23  GLUCOSE 349* 141*  BUN 42* 37*  CREATININE 1.72* 1.56*  CALCIUM 9.8 9.6  AST 17  --   ALT 13*  --   ALKPHOS 84  --   BILITOT 0.5  --     Cardiac Enzymes No results for input(s): TROPONINI in the last 168 hours.  Microbiology Results  @[email or web address] @  RADIOLOGY:  Ct Head Wo Contrast  10/08/2015  CLINICAL DATA:  Slurred speech, code stroke EXAM: CT HEAD WITHOUT CONTRAST TECHNIQUE: Contiguous axial images were obtained from the base of the skull through the vertex without intravenous contrast. COMPARISON:  None. FINDINGS: No evidence of parenchymal hemorrhage or extra-axial fluid collection. No mass lesion, mass effect, or midline shift. No CT evidence of acute infarction. Mild subcortical white matter and periventricular small vessel ischemic changes, including in the subcortical left parietal lobe (series 2/ image 21). Mild  cortical and central atrophy, frontal lobe predominant. Secondary ventricular prominence. Partial opacification of the bilateral ethmoid sinuses. Mastoid air cells are clear. No evidence of calvarial fracture. IMPRESSION: No evidence of acute intracranial abnormality. Atrophy with small vessel ischemic changes. These results were called by telephone at the time of interpretation on 10/08/2015 at 12:35 pm to Dr. Nance Pear, who verbally acknowledged these results. Electronically Signed   By: Julian Hy M.D.   On: 10/08/2015 12:36   Mr Jodene Nam Head Wo Contrast  10/09/2015  CLINICAL DATA:  LEFT-sided weakness and slurred speech which began 10/08/2015. Acute RIGHT cerebral infarction. Stroke risk factors include diabetes, hypertension, and hyperlipidemia. EXAM: MRA HEAD WITHOUT CONTRAST TECHNIQUE: Angiographic images of the Circle of Willis were obtained using MRA technique without intravenous contrast. COMPARISON:  MRI brain and CT head 10/08/2015. FINDINGS: LEFT internal carotid artery demonstrates normal upper cervical, and petrous segments. 75% stenosis inferior cavernous ICA on the LEFT. Slight tapering of the supraclinoid ICA without measurable stenosis. Severe stenosis of the proximal and distal LEFT anterior cerebral artery A1 segments. Diminutive LEFT M1 MCA with a focal distal stenosis, 50-75%. Poorly visualized LEFT MCA bifurcation. Moderately diseased LEFT A2 ACA proximally. Diffusely narrowed supraclinoid ICA on the RIGHT. Estimated long segment 50-75% stenosis. ICA terminus widely patent. Severely diseased distal M1 segment RIGHT middle cerebral artery. Poor visualization of the proximal M2 MCA branches. Mildly irregular but non stenotic RIGHT A1 ACA. Basilar artery widely patent. Diminished caliber and signal of the distal vertebral arteries bilaterally. Long segment stenosis of the distal RIGHT vertebral estimated 75-90%. Focal stenosis of the distal LEFT vertebral estimated 75%. Severe  stenosis proximal LEFT PCA extending to the distal P1 and proximal P2 LEFT PCA. Moderately diseased RIGHT P2 PCA. Cerebellar branches poorly seen. No intracranial aneurysm is evident. IMPRESSION: Widespread intracranial atherosclerotic change as described. The appearance is most typical of diabetic type pattern. With regard to the patient's acute infarction, there is smooth tapering and severe stenosis of the distal M1 RIGHT MCA. This appears to involve the RIGHT MCA bifurcation. Electronically Signed   By: Staci Righter M.D.   On: 10/09/2015 11:57   Mr Brain Wo Contrast  10/08/2015  CLINICAL DATA:  Slurred speech.  Suspected stroke. EXAM: MRI HEAD WITHOUT CONTRAST TECHNIQUE: Multiplanar, multiecho pulse sequences of the brain and surrounding structures were obtained without intravenous contrast. COMPARISON:  CT earlier in the  the day. FINDINGS: Acute RIGHT MCA lenticulostriate territory infarction, nonhemorrhagic, affects the posterior lentiform nucleus, and regional white matter. No other areas of acute infarction are observed. No mass lesion. Prominence of the extra-axial CSF spaces reflects atrophy. Hydrocephalus ex vacuo. Moderately advanced chronic microvascular ischemic change throughout the subcortical greater than periventricular white matter. No abnormality of the pituitary or cerebellar tonsils. Flow voids are maintained in the internal carotid arteries, basilar artery, and both vertebral arteries. Incompletely evaluated is the proximal M1 segment, RIGHT MCA, which I suspect is narrowed; recommend MRA intracranial for further evaluation. Mild chronic sinus disease. BILATERAL cataract extraction. No mastoid fluid. No osseous findings. Compared with recent CT, the infarct is difficult to visualize. IMPRESSION: Acute, nonhemorrhagic, RIGHT MCA lenticulostriate infarction affecting the lateral and posterior lentiform nucleus and regional white matter. Recommend MRA intracranial to evaluate for proximal  RIGHT MCA stenosis. Atrophy and small vessel disease. Electronically Signed   By: Staci Righter M.D.   On: 10/08/2015 20:07   US Carotid Bilateral  10/08/2015  CLINICAL DATA:  Stroke EXAM: BILATERAL CAROTID DUPLEX ULTRASOUND TECHNIQUE: Pearline Cables scale imaging, color Doppler and duplex ultrasound were performed of bilateral carotid and vertebral arteries in the neck. COMPARISON:  None. FINDINGS: Criteria: Quantification of carotid stenosis is based on velocity parameters that correlate the residual internal carotid diameter with NASCET-based stenosis levels, using the diameter of the distal internal carotid lumen as the denominator for stenosis measurement. The following velocity measurements were obtained: RIGHT ICA:  165/34 cm/sec CCA:  123456 cm/sec SYSTOLIC ICA/CCA RATIO:  [id number] DIASTOLIC ICA/CCA RATIO:  [id number] ECA:  138 cm/sec LEFT ICA:  78/21 cm/sec CCA:  [id number] cm/sec SYSTOLIC ICA/CCA RATIO:  [id number] DIASTOLIC ICA/CCA RATIO:  [id number] ECA:  74 cm/sec RIGHT CAROTID ARTERY: Generalized intimal thickening distal common carotid artery. Soft plaque in the bulb and internal carotid artery. RIGHT VERTEBRAL ARTERY:  Antegrade flow LEFT CAROTID ARTERY: Intimal thickening distal common carotid artery with soft plaque in the bulb extending into the proximal internal carotid arteries. LEFT VERTEBRAL ARTERY:  Antegrade flow IMPRESSION: Estimate of 50-69% narrowing on the right. No significant hemodynamic narrowing on the left.More precise estimate of narrowing could be obtained with CTA if indicated. Electronically Signed   By: Skipper Cliche M.D.   On: 10/08/2015 17:03      Management plans discussed with the patient and he is in agreement. Stable for discharge home  Patient should follow up with PCP on Wednesday and vascular surgery in one week  CODE STATUS:     Code Status Orders        Start     Ordered   10/08/15 1649  Full code   Continuous     10/08/15 1648      TOTAL TIME TAKING CARE OF THIS PATIENT: 35  minutes.    Note: This dictation was prepared with Dragon dictation along with smaller phrase technology. Any transcriptional errors that result from this process are unintentional.  Coriana Angello M.D on 10/09/2015 at 12:13 PM  Between 7am to 6pm - Pager - [phone] After 6pm go to [email or web address] - password [email or web address] [facility]  [facility]  Office  [phone]  CC: Primary care physician; Cletis Athens, MD

## 2015-10-09 NOTE — Consult Note (Signed)
Reason for Consult: Slurred speech  [facility] Referring Physician: Dr. Benjie Karvonen Mike Decker is an 74 y.o. male.  HPI: 59 yom history of DM, PVD that presented with slurred speech and bilateral lower extremity weakness. He states the slurred speech began a day or so ago. He also states he has had some lower extremity weakness for sometime but the symptoms progressed when the speech changed. His wife states that he has an unsteady gait. Otherwise he denies change in vision, denies localized weakness in the extremities. He denies having this in the past.  Past Medical History  Diagnosis Date  . Arthritis   . Colon polyp   . Diabetes mellitus without complication ([place])   . Hyperlipidemia   . Myocardial infarct ([place])   . Heart disease   . Hypertension   . Atrioventricular canal (AVC)     irregular heart beats  . Diverticulosis   . Barrett esophagus   . Sleep apnea   . Gout   . Vitreoretinal degeneration   . Ocular hypertension   . Hemangioma     liver  . Skin cancer   . Cancer [facility]) 2002    prostate  . Peripheral vascular disease [facility])     Past Surgical History  Procedure Laterality Date  . Prostate surgery  2002  . Coronary angioplasty with stent placement  2012  . Intraocular lens insertion    . Hernia repair      x2  . Cataract extraction  2011, 2012  . Rotator cuff repair Right   . Nose surgery      submucous resection    Family History  Problem Relation Age of Onset  . Stroke Maternal Grandfather     Social History:  reports that he quit smoking about 38 years ago. His smoking use included Cigarettes. He quit after 20 years of use. He has never used smokeless tobacco. He reports that he does not drink alcohol or use illicit drugs.  Allergies:  Allergies  Allergen Reactions  . Peanut-Containing Drug Products Anaphylaxis  . Penicillins Hives and Rash    Has patient had a PCN reaction causing immediate rash, facial/tongue/throat swelling, SOB or  lightheadedness with hypotension: Yes Has patient had a PCN reaction causing severe rash involving mucus membranes or skin necrosis: No Has patient had a PCN reaction that required hospitalization No Has patient had a PCN reaction occurring within the last 10 years: No If all of the above answers are "NO", then may proceed with Cephalosporin use.  . Bee Venom Swelling  . Influenza Vaccines Hives  . Inh [Isoniazid] Hives  . Kenalog [Triamcinolone Acetonide] Hives  . Nalfon [Fenoprofen Calcium] Hives    Medications: I have reviewed the patient's current medications.  Results for orders placed or performed during the hospital encounter of 10/08/15 (from the past 48 hour(s))  Protime-INR     Status: None   Collection Time: 10/08/15 12:40 PM  Result Value Ref Range   Prothrombin Time 12.8 11.4 - 15.0 seconds   INR 0.94   APTT     Status: None   Collection Time: 10/08/15 12:40 PM  Result Value Ref Range   aPTT 30 24 - 36 seconds  CBC     Status: Abnormal   Collection Time: 10/08/15 12:40 PM  Result Value Ref Range   WBC 8.6 3.8 - 10.6 K/uL   RBC 4.89 4.40 - 5.90 MIL/uL   Hemoglobin 14.4 13.0 - 18.0 g/dL   HCT 42.7  40.0 - 52.0 %   MCV 87.3 80.0 - 100.0 fL   MCH 29.5 26.0 - 34.0 pg   MCHC 33.8 32.0 - 36.0 g/dL   RDW 16.3 (H) 11.5 - 14.5 %   Platelets 268 150 - 440 K/uL  Differential     Status: Abnormal   Collection Time: 10/08/15 12:40 PM  Result Value Ref Range   Neutrophils Relative % 64 %   Neutro Abs 5.5 1.4 - 6.5 K/uL   Lymphocytes Relative 16 %   Lymphs Abs 1.4 1.0 - 3.6 K/uL   Monocytes Relative 9 %   Monocytes Absolute 0.8 0.2 - 1.0 K/uL   Eosinophils Relative 10 %   Eosinophils Absolute 0.8 (H) 0 - 0.7 K/uL   Basophils Relative 1 %   Basophils Absolute 0.1 0 - 0.1 K/uL  Comprehensive metabolic panel     Status: Abnormal   Collection Time: 10/08/15 12:40 PM  Result Value Ref Range   Sodium 132 (L) 135 - 145 mmol/L   Potassium 4.2 3.5 - 5.1 mmol/L   Chloride 97  (L) 101 - 111 mmol/L   CO2 25 22 - 32 mmol/L   Glucose, Bld 349 (H) 65 - 99 mg/dL   BUN 42 (H) 6 - 20 mg/dL   Creatinine, Ser 1.72 (H) 0.61 - 1.24 mg/dL   Calcium 9.8 8.9 - 10.3 mg/dL   Total Protein 8.0 6.5 - 8.1 g/dL   Albumin 4.7 3.5 - 5.0 g/dL   AST 17 15 - 41 U/L   ALT 13 (L) 17 - 63 U/L   Alkaline Phosphatase 84 38 - 126 U/L   Total Bilirubin 0.5 0.3 - 1.2 mg/dL   GFR calc non Af Amer 37 (L) >60 mL/min   GFR calc Af Amer 43 (L) >60 mL/min    Comment: (NOTE) The eGFR has been calculated using the CKD EPI equation. This calculation has not been validated in all clinical situations. eGFR's persistently <60 mL/min signify possible Chronic Kidney Disease.    Anion gap 10 5 - 15  Hemoglobin A1c     Status: Abnormal   Collection Time: 10/08/15 12:40 PM  Result Value Ref Range   Hgb A1c MFr Bld 9.4 (H) 4.0 - 6.0 %  Lipid panel     Status: Abnormal   Collection Time: 10/08/15 12:40 PM  Result Value Ref Range   Cholesterol 189 0 - 200 mg/dL   Triglycerides 610 (H) <150 mg/dL   HDL 30 (L) >40 mg/dL   Total CHOL/HDL Ratio 6.3 RATIO   VLDL UNABLE TO CALCULATE IF TRIGLYCERIDE OVER 400 mg/dL 0 - 40 mg/dL   LDL Cholesterol UNABLE TO CALCULATE IF TRIGLYCERIDE OVER 400 mg/dL 0 - 99 mg/dL    Comment:        Total Cholesterol/HDL:CHD Risk Coronary Heart Disease Risk Table                     Men   Women  1/2 Average Risk   3.4   3.3  Average Risk       5.0   4.4  2 X Average Risk   9.6   7.1  3 X Average Risk  23.4   11.0        Use the calculated Patient Ratio above and the CHD Risk Table to determine the patient's CHD Risk.        ATP III CLASSIFICATION (LDL):  <100     mg/dL   Optimal  100-129  mg/dL   Near or Above                    Optimal  130-159  mg/dL   Borderline  160-189  mg/dL   High  >190     mg/dL   Very High   Glucose, capillary     Status: Abnormal   Collection Time: 10/08/15 12:51 PM  Result Value Ref Range   Glucose-Capillary 318 (H) 65 - 99 mg/dL   Glucose, capillary     Status: Abnormal   Collection Time: 10/08/15  5:18 PM  Result Value Ref Range   Glucose-Capillary 202 (H) 65 - 99 mg/dL  Glucose, capillary     Status: Abnormal   Collection Time: 10/08/15  9:34 PM  Result Value Ref Range   Glucose-Capillary 272 (H) 65 - 99 mg/dL  Basic metabolic panel     Status: Abnormal   Collection Time: 10/09/15  4:28 AM  Result Value Ref Range   Sodium 138 135 - 145 mmol/L   Potassium 3.7 3.5 - 5.1 mmol/L   Chloride 104 101 - 111 mmol/L   CO2 23 22 - 32 mmol/L   Glucose, Bld 141 (H) 65 - 99 mg/dL   BUN 37 (H) 6 - 20 mg/dL   Creatinine, Ser 1.56 (H) 0.61 - 1.24 mg/dL   Calcium 9.6 8.9 - 10.3 mg/dL   GFR calc non Af Amer 42 (L) >60 mL/min   GFR calc Af Amer 48 (L) >60 mL/min    Comment: (NOTE) The eGFR has been calculated using the CKD EPI equation. This calculation has not been validated in all clinical situations. eGFR's persistently <60 mL/min signify possible Chronic Kidney Disease.    Anion gap 11 5 - 15  CBC     Status: Abnormal   Collection Time: 10/09/15  4:28 AM  Result Value Ref Range   WBC 10.8 (H) 3.8 - 10.6 K/uL   RBC 4.95 4.40 - 5.90 MIL/uL   Hemoglobin 14.3 13.0 - 18.0 g/dL   HCT 42.9 40.0 - 52.0 %   MCV 86.7 80.0 - 100.0 fL   MCH 28.9 26.0 - 34.0 pg   MCHC 33.4 32.0 - 36.0 g/dL   RDW 15.9 (H) 11.5 - 14.5 %   Platelets 264 150 - 440 K/uL  Glucose, capillary     Status: Abnormal   Collection Time: 10/09/15  8:00 AM  Result Value Ref Range   Glucose-Capillary 162 (H) 65 - 99 mg/dL  Glucose, capillary     Status: Abnormal   Collection Time: 10/09/15 12:12 PM  Result Value Ref Range   Glucose-Capillary 308 (H) 65 - 99 mg/dL    Ct Head Wo Contrast  10/08/2015  CLINICAL DATA:  Slurred speech, code stroke EXAM: CT HEAD WITHOUT CONTRAST TECHNIQUE: Contiguous axial images were obtained from the base of the skull through the vertex without intravenous contrast. COMPARISON:  None. FINDINGS: No evidence of  parenchymal hemorrhage or extra-axial fluid collection. No mass lesion, mass effect, or midline shift. No CT evidence of acute infarction. Mild subcortical white matter and periventricular small vessel ischemic changes, including in the subcortical left parietal lobe (series 2/ image 21). Mild cortical and central atrophy, frontal lobe predominant. Secondary ventricular prominence. Partial opacification of the bilateral ethmoid sinuses. Mastoid air cells are clear. No evidence of calvarial fracture. IMPRESSION: No evidence of acute intracranial abnormality. Atrophy with small vessel ischemic changes. These results were called by telephone at the time of interpretation on 10/08/2015  at 12:35 pm to Dr. Nance Pear, who verbally acknowledged these results. Electronically Signed   By: Julian Hy M.D.   On: 10/08/2015 12:36   Ct Angio Neck W/cm &/or Wo/cm  10/09/2015  CLINICAL DATA:  Acute nonhemorrhagic RIGHT MCA territory infarction. Narrowing of the RIGHT ICA on duplex ultrasound. EXAM: CT ANGIOGRAPHY NECK TECHNIQUE: Multidetector CT imaging of the neck was performed using the standard protocol during bolus administration of intravenous contrast. Multiplanar CT image reconstructions and MIPs were obtained to evaluate the vascular anatomy. Carotid stenosis measurements (when applicable) are obtained utilizing NASCET criteria, using the distal internal carotid diameter as the denominator. CONTRAST:  50m OMNIPAQUE IOHEXOL 350 MG/ML SOLN COMPARISON:  BILATERAL carotid duplex ultrasound 10/08/2015. MRI brain 10/08/2015. CT head 10/08/2015. FINDINGS: Aortic arch: Standard branching. Imaged portion shows no evidence of aneurysm or dissection. No significant stenosis of the major arch vessel origins. Right carotid system: Non stenotic calcific and soft plaque, proximal RIGHT ICA, with the greatest narrowing 1 cm above the RIGHT ICA origin. No significant luminal narrowing based on measurements of 3.4/4.5  proximal/ distal. No evidence of dissection, stenosis (50% or greater) or occlusion. Left carotid system: Minor calcific plaque at the bifurcation. No evidence of dissection, stenosis (50% or greater) or occlusion. Vertebral arteries: LEFT vertebral dominant. No evidence of dissection, stenosis (50% or greater) or occlusion. Other neck: No pneumothorax or lung apex lesion. No neck masses. Spondylosis with significant disc space narrowing at C5-6. Central protrusion suspected at C4-C5. Correlating with previous studies, the duplex ultrasound has over estimated the degree of stenosis on the RIGHT. IMPRESSION: No significant RIGHT ICA flow reducing lesion or or ulceration. Based on luminal diameter measurements, there is less than 50% stenosis in the proximal RIGHT ICA. Electronically Signed   By: JStaci RighterM.D.   On: 10/09/2015 12:41   Mr MJodene NamHead Wo Contrast  10/09/2015  CLINICAL DATA:  LEFT-sided weakness and slurred speech which began 10/08/2015. Acute RIGHT cerebral infarction. Stroke risk factors include diabetes, hypertension, and hyperlipidemia. EXAM: MRA HEAD WITHOUT CONTRAST TECHNIQUE: Angiographic images of the Circle of Willis were obtained using MRA technique without intravenous contrast. COMPARISON:  MRI brain and CT head 10/08/2015. FINDINGS: LEFT internal carotid artery demonstrates normal upper cervical, and petrous segments. 75% stenosis inferior cavernous ICA on the LEFT. Slight tapering of the supraclinoid ICA without measurable stenosis. Severe stenosis of the proximal and distal LEFT anterior cerebral artery A1 segments. Diminutive LEFT M1 MCA with a focal distal stenosis, 50-75%. Poorly visualized LEFT MCA bifurcation. Moderately diseased LEFT A2 ACA proximally. Diffusely narrowed supraclinoid ICA on the RIGHT. Estimated long segment 50-75% stenosis. ICA terminus widely patent. Severely diseased distal M1 segment RIGHT middle cerebral artery. Poor visualization of the proximal M2 MCA  branches. Mildly irregular but non stenotic RIGHT A1 ACA. Basilar artery widely patent. Diminished caliber and signal of the distal vertebral arteries bilaterally. Long segment stenosis of the distal RIGHT vertebral estimated 75-90%. Focal stenosis of the distal LEFT vertebral estimated 75%. Severe stenosis proximal LEFT PCA extending to the distal P1 and proximal P2 LEFT PCA. Moderately diseased RIGHT P2 PCA. Cerebellar branches poorly seen. No intracranial aneurysm is evident. IMPRESSION: Widespread intracranial atherosclerotic change as described. The appearance is most typical of diabetic type pattern. With regard to the patient's acute infarction, there is smooth tapering and severe stenosis of the distal M1 RIGHT MCA. This appears to involve the RIGHT MCA bifurcation. Electronically Signed   By: JStaci RighterM.D.   On: 10/09/2015  11:57   Mr Herby Abraham Contrast  10/08/2015  CLINICAL DATA:  Slurred speech.  Suspected stroke. EXAM: MRI HEAD WITHOUT CONTRAST TECHNIQUE: Multiplanar, multiecho pulse sequences of the brain and surrounding structures were obtained without intravenous contrast. COMPARISON:  CT earlier in the the day. FINDINGS: Acute RIGHT MCA lenticulostriate territory infarction, nonhemorrhagic, affects the posterior lentiform nucleus, and regional white matter. No other areas of acute infarction are observed. No mass lesion. Prominence of the extra-axial CSF spaces reflects atrophy. Hydrocephalus ex vacuo. Moderately advanced chronic microvascular ischemic change throughout the subcortical greater than periventricular white matter. No abnormality of the pituitary or cerebellar tonsils. Flow voids are maintained in the internal carotid arteries, basilar artery, and both vertebral arteries. Incompletely evaluated is the proximal M1 segment, RIGHT MCA, which I suspect is narrowed; recommend MRA intracranial for further evaluation. Mild chronic sinus disease. BILATERAL cataract extraction. No mastoid  fluid. No osseous findings. Compared with recent CT, the infarct is difficult to visualize. IMPRESSION: Acute, nonhemorrhagic, RIGHT MCA lenticulostriate infarction affecting the lateral and posterior lentiform nucleus and regional white matter. Recommend MRA intracranial to evaluate for proximal RIGHT MCA stenosis. Atrophy and small vessel disease. Electronically Signed   By: Staci Righter M.D.   On: 10/08/2015 20:07   US Carotid Bilateral  10/08/2015  CLINICAL DATA:  Stroke EXAM: BILATERAL CAROTID DUPLEX ULTRASOUND TECHNIQUE: Pearline Cables scale imaging, color Doppler and duplex ultrasound were performed of bilateral carotid and vertebral arteries in the neck. COMPARISON:  None. FINDINGS: Criteria: Quantification of carotid stenosis is based on velocity parameters that correlate the residual internal carotid diameter with NASCET-based stenosis levels, using the diameter of the distal internal carotid lumen as the denominator for stenosis measurement. The following velocity measurements were obtained: RIGHT ICA:  165/34 cm/sec CCA:  69/62 cm/sec SYSTOLIC ICA/CCA RATIO:  9.52 DIASTOLIC ICA/CCA RATIO:  8.41 ECA:  138 cm/sec LEFT ICA:  78/21 cm/sec CCA:  32/44 cm/sec SYSTOLIC ICA/CCA RATIO:  0.10 DIASTOLIC ICA/CCA RATIO:  2.72 ECA:  74 cm/sec RIGHT CAROTID ARTERY: Generalized intimal thickening distal common carotid artery. Soft plaque in the bulb and internal carotid artery. RIGHT VERTEBRAL ARTERY:  Antegrade flow LEFT CAROTID ARTERY: Intimal thickening distal common carotid artery with soft plaque in the bulb extending into the proximal internal carotid arteries. LEFT VERTEBRAL ARTERY:  Antegrade flow IMPRESSION: Estimate of 50-69% narrowing on the right. No significant hemodynamic narrowing on the left.More precise estimate of narrowing could be obtained with CTA if indicated. Electronically Signed   By: Skipper Cliche M.D.   On: 10/08/2015 17:03    Review of Systems  Eyes: Negative.   Respiratory: Positive for  shortness of breath.   Cardiovascular: Negative.   Genitourinary: Negative.   Musculoskeletal: Negative.   Skin: Negative.   Neurological: Positive for speech change and weakness.  Endo/Heme/Allergies: Negative.   Psychiatric/Behavioral: Negative.    Blood pressure 190/82, pulse 113, temperature 98 F (36.7 C), temperature source Oral, resp. rate 20, height '5\' 7"'  (1.702 m), weight 85.6 kg (188 lb 11.4 oz), SpO2 95 %. Physical Exam  Constitutional: He is oriented to person, place, and time. He appears well-developed.  HENT:  Head: Normocephalic.  Eyes: Pupils are equal, round, and reactive to light.  Neck: Normal range of motion.  Cardiovascular: Normal rate and regular rhythm.   No carotid bruits  Respiratory: Effort normal.  GI: Soft.  Musculoskeletal: Normal range of motion.  Neurological: He is alert and oriented to person, place, and time.  Skin: Skin is warm.  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  His CVA is not from his Carotid stenosis however, he should be followed closely by Vascular Surgery- Dr. Lucky Cowboy outpatient.  Continue ASA, Plavix, DM control and neurology recommendations.  Esco, Miechia A 10/09/2015, 12:44 PM

## 2015-10-09 NOTE — Progress Notes (Signed)
Pt Na was 132 on 10/29. Spoke with Dr. Claria Dice about Na level. Per Dr. Claria Dice, no new orders, allow rounding Md to address Na level.

## 2015-10-11 DIAGNOSIS — I6529 Occlusion and stenosis of unspecified carotid artery: Secondary | ICD-10-CM | POA: Diagnosis not present

## 2015-10-11 DIAGNOSIS — I70212 Atherosclerosis of native arteries of extremities with intermittent claudication, left leg: Secondary | ICD-10-CM | POA: Diagnosis not present

## 2015-10-11 DIAGNOSIS — I739 Peripheral vascular disease, unspecified: Secondary | ICD-10-CM | POA: Diagnosis not present

## 2015-10-11 DIAGNOSIS — I639 Cerebral infarction, unspecified: Secondary | ICD-10-CM | POA: Diagnosis not present

## 2015-10-11 DIAGNOSIS — I10 Essential (primary) hypertension: Secondary | ICD-10-CM | POA: Diagnosis not present

## 2015-10-11 DIAGNOSIS — E785 Hyperlipidemia, unspecified: Secondary | ICD-10-CM | POA: Diagnosis not present

## 2015-10-11 DIAGNOSIS — I70213 Atherosclerosis of native arteries of extremities with intermittent claudication, bilateral legs: Secondary | ICD-10-CM | POA: Diagnosis not present

## 2015-10-11 DIAGNOSIS — N183 Chronic kidney disease, stage 3 (moderate): Secondary | ICD-10-CM | POA: Diagnosis not present

## 2015-10-11 DIAGNOSIS — E119 Type 2 diabetes mellitus without complications: Secondary | ICD-10-CM | POA: Diagnosis not present

## 2015-10-12 DIAGNOSIS — E119 Type 2 diabetes mellitus without complications: Secondary | ICD-10-CM | POA: Diagnosis not present

## 2015-10-12 DIAGNOSIS — G459 Transient cerebral ischemic attack, unspecified: Secondary | ICD-10-CM | POA: Diagnosis not present

## 2015-10-12 DIAGNOSIS — G4730 Sleep apnea, unspecified: Secondary | ICD-10-CM | POA: Diagnosis not present

## 2015-10-12 DIAGNOSIS — Z23 Encounter for immunization: Secondary | ICD-10-CM | POA: Diagnosis not present

## 2015-10-18 DIAGNOSIS — E1122 Type 2 diabetes mellitus with diabetic chronic kidney disease: Secondary | ICD-10-CM | POA: Diagnosis not present

## 2015-10-18 DIAGNOSIS — I63239 Cerebral infarction due to unspecified occlusion or stenosis of unspecified carotid arteries: Secondary | ICD-10-CM | POA: Diagnosis not present

## 2015-10-18 DIAGNOSIS — E1165 Type 2 diabetes mellitus with hyperglycemia: Secondary | ICD-10-CM | POA: Diagnosis not present

## 2015-10-18 DIAGNOSIS — Z794 Long term (current) use of insulin: Secondary | ICD-10-CM | POA: Diagnosis not present

## 2015-10-18 DIAGNOSIS — I10 Essential (primary) hypertension: Secondary | ICD-10-CM | POA: Diagnosis not present

## 2015-10-18 DIAGNOSIS — I69328 Other speech and language deficits following cerebral infarction: Secondary | ICD-10-CM | POA: Diagnosis not present

## 2015-10-18 DIAGNOSIS — N183 Chronic kidney disease, stage 3 (moderate): Secondary | ICD-10-CM | POA: Diagnosis not present

## 2015-10-18 DIAGNOSIS — E1142 Type 2 diabetes mellitus with diabetic polyneuropathy: Secondary | ICD-10-CM | POA: Diagnosis not present

## 2015-10-20 DIAGNOSIS — M1A00X0 Idiopathic chronic gout, unspecified site, without tophus (tophi): Secondary | ICD-10-CM | POA: Diagnosis not present

## 2015-10-20 DIAGNOSIS — N182 Chronic kidney disease, stage 2 (mild): Secondary | ICD-10-CM | POA: Diagnosis not present

## 2015-10-20 DIAGNOSIS — E669 Obesity, unspecified: Secondary | ICD-10-CM | POA: Diagnosis not present

## 2015-10-20 DIAGNOSIS — I639 Cerebral infarction, unspecified: Secondary | ICD-10-CM | POA: Diagnosis not present

## 2015-10-20 DIAGNOSIS — J329 Chronic sinusitis, unspecified: Secondary | ICD-10-CM | POA: Diagnosis not present

## 2015-10-20 DIAGNOSIS — E119 Type 2 diabetes mellitus without complications: Secondary | ICD-10-CM | POA: Diagnosis not present

## 2015-10-20 DIAGNOSIS — I739 Peripheral vascular disease, unspecified: Secondary | ICD-10-CM | POA: Diagnosis not present

## 2015-10-20 DIAGNOSIS — K219 Gastro-esophageal reflux disease without esophagitis: Secondary | ICD-10-CM | POA: Diagnosis not present

## 2015-10-20 DIAGNOSIS — E784 Other hyperlipidemia: Secondary | ICD-10-CM | POA: Diagnosis not present

## 2015-10-20 DIAGNOSIS — I208 Other forms of angina pectoris: Secondary | ICD-10-CM | POA: Diagnosis not present

## 2015-10-20 DIAGNOSIS — I2510 Atherosclerotic heart disease of native coronary artery without angina pectoris: Secondary | ICD-10-CM | POA: Diagnosis not present

## 2015-10-25 DIAGNOSIS — Z8673 Personal history of transient ischemic attack (TIA), and cerebral infarction without residual deficits: Secondary | ICD-10-CM | POA: Diagnosis not present

## 2015-10-31 ENCOUNTER — Encounter: Payer: Self-pay | Admitting: Speech Pathology

## 2015-10-31 ENCOUNTER — Ambulatory Visit: Payer: Medicare Other | Attending: Neurology | Admitting: Speech Pathology

## 2015-10-31 DIAGNOSIS — R471 Dysarthria and anarthria: Secondary | ICD-10-CM | POA: Insufficient documentation

## 2015-11-01 IMAGING — US US CAROTID DUPLEX BILAT
1 series · 13 of 24 positions shown · non-contrast
Comparison: None.

CLINICAL DATA: Stroke

EXAM:
BILATERAL CAROTID DUPLEX ULTRASOUND
TECHNIQUE: Gray scale imaging, color Doppler and duplex ultrasound were
performed of bilateral carotid and vertebral arteries in the neck.

[Series 1: us carotid duplex bilat · 13 of 66 slices shown]
[im 1/66]
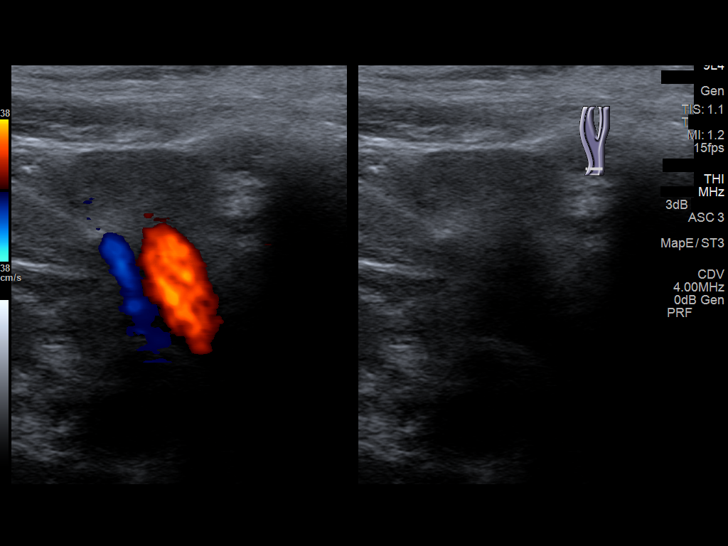
[im 6/66]
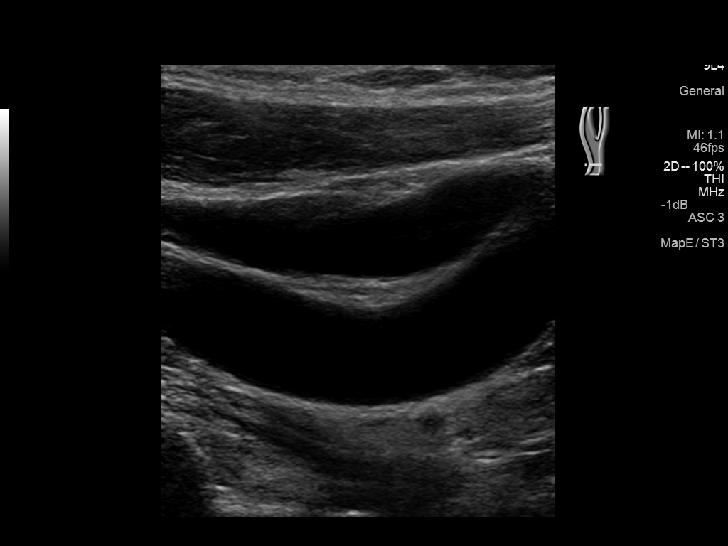
[im 12/66]
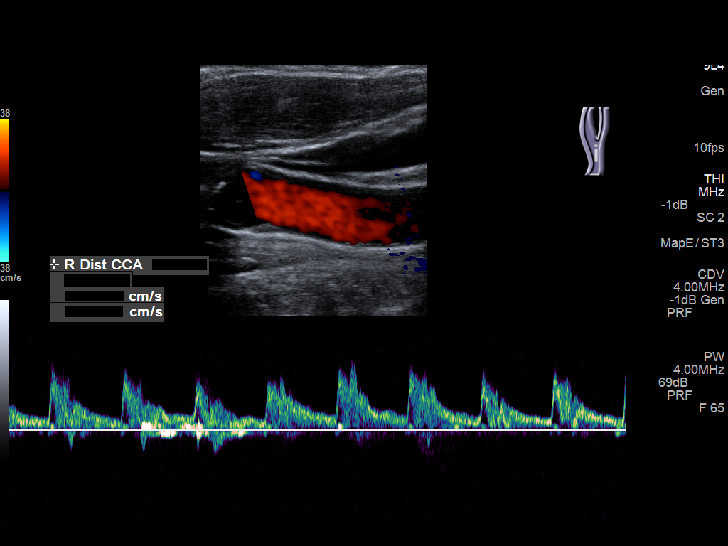
[im 17/66]
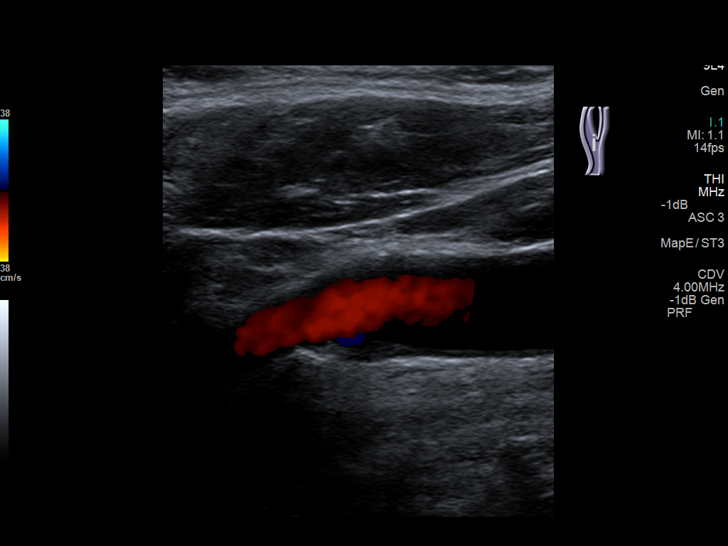
[im 23/66]
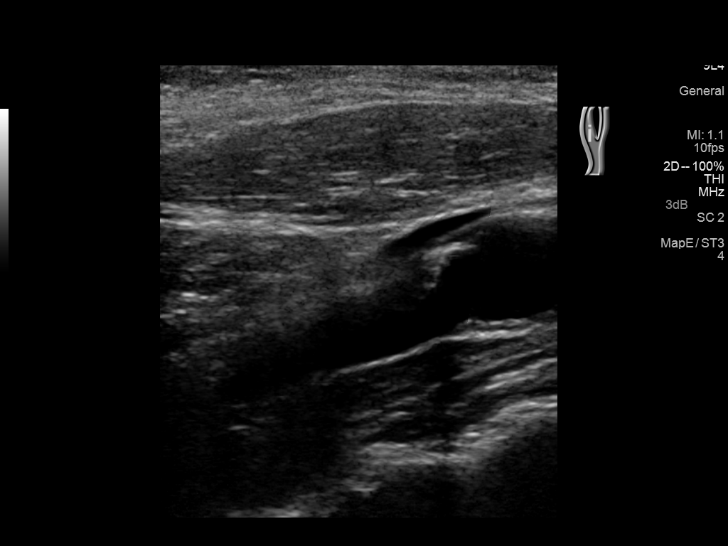
[im 29/66]
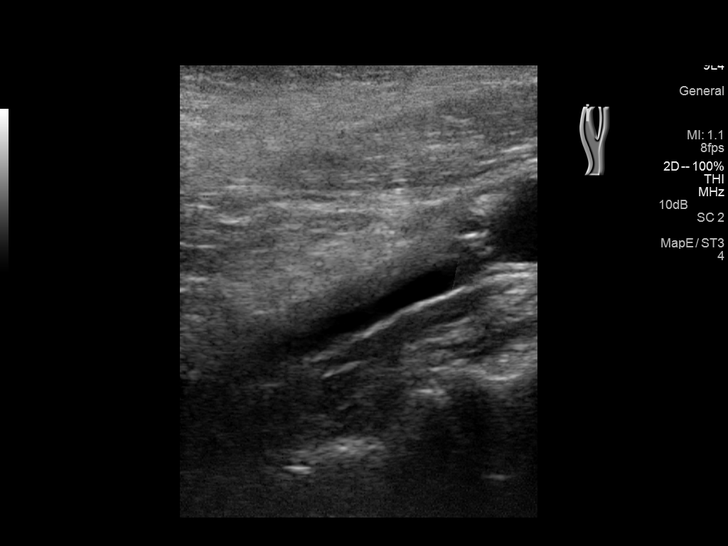
[im 34/66]
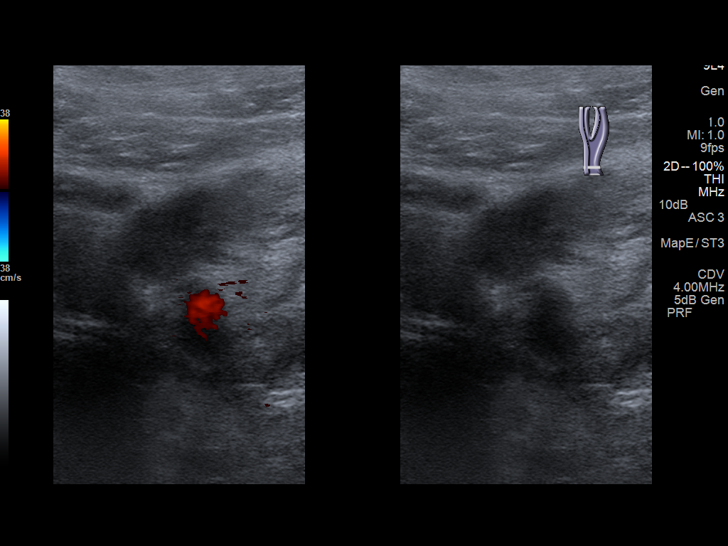
[im 37/66]
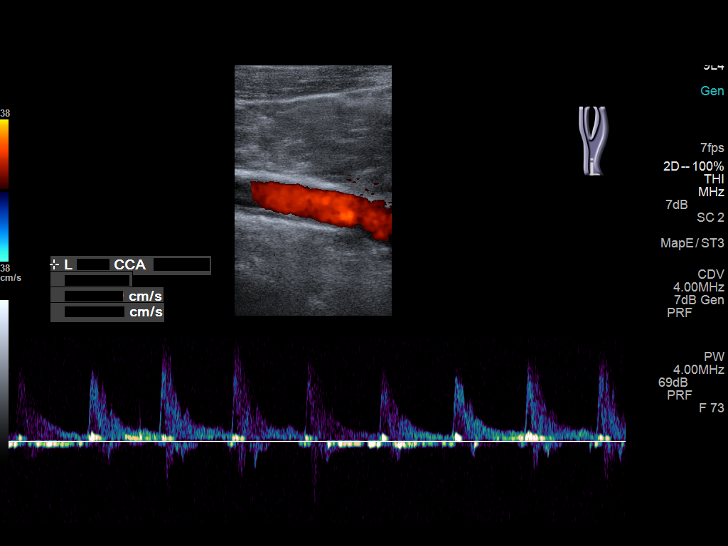
[im 43/66]
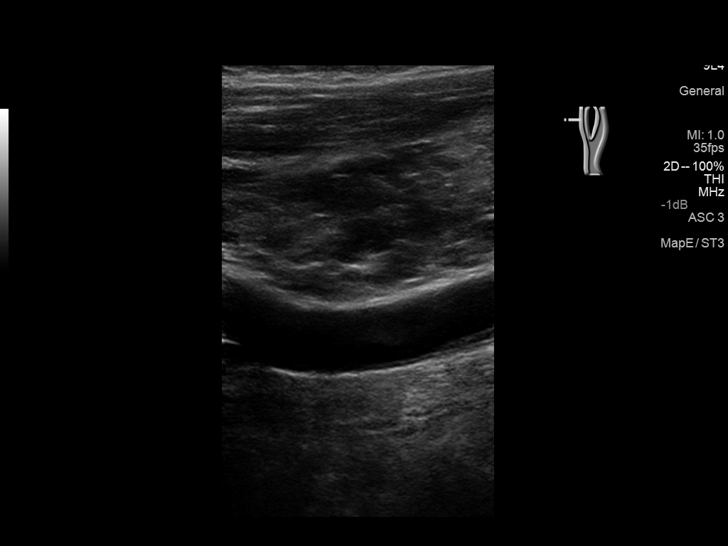
[im 49/66]
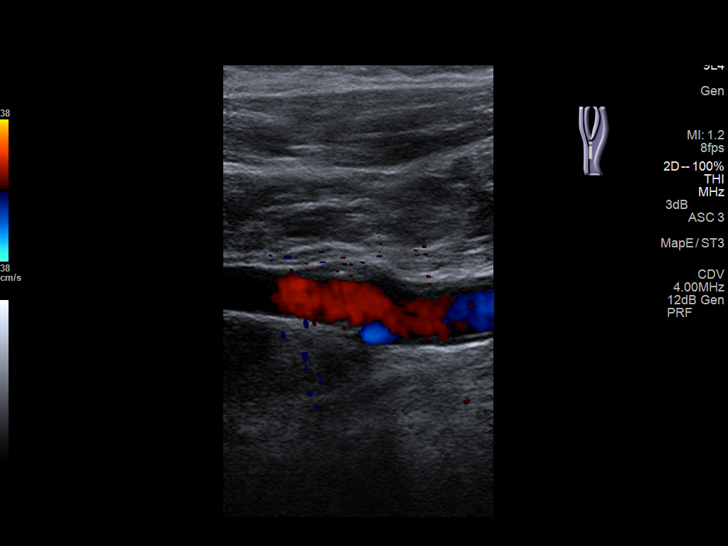
[im 54/66]
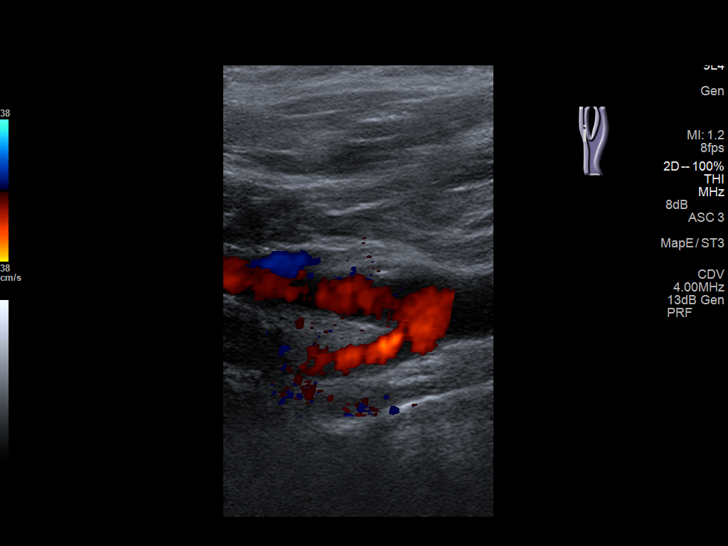
[im 60/66]
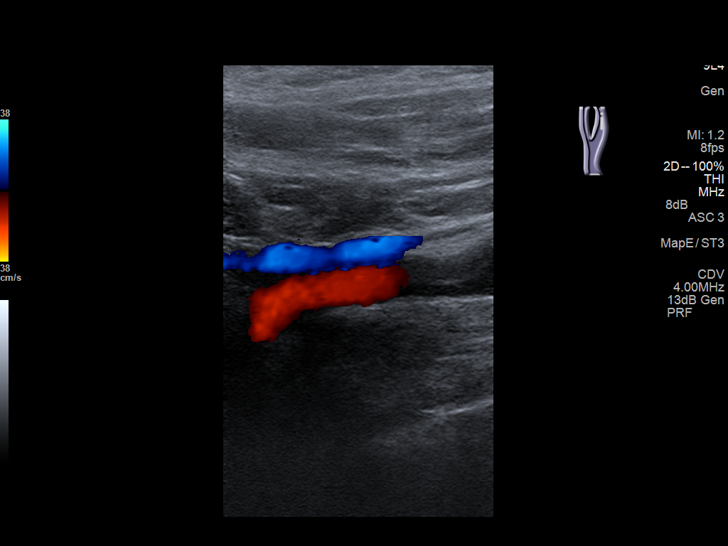
[im 66/66]
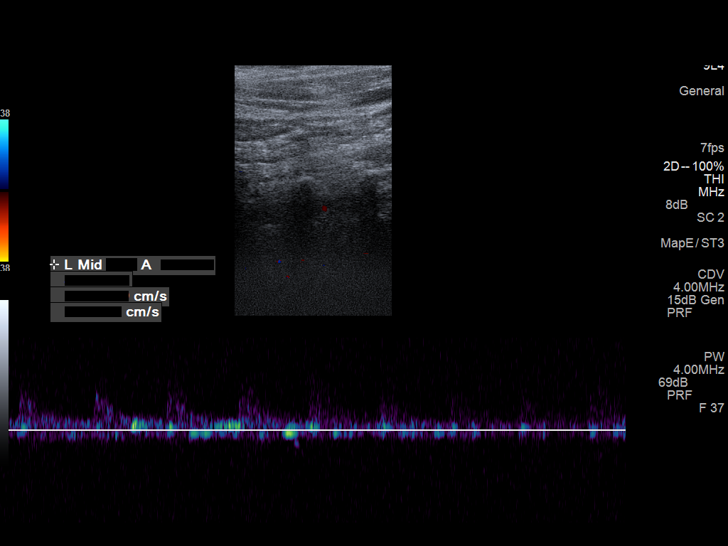

[13 of 24 positions shown; findings below may reference images not displayed]

FINDINGS: Criteria: Quantification of carotid stenosis is based on velocity
parameters that correlate the residual internal carotid diameter
with NASCET-based stenosis levels, using the diameter of the distal
internal carotid lumen as the denominator for stenosis measurement.

The following velocity measurements were obtained:

RIGHT

ICA:  165/34 cm/sec

CCA:  56/90 cm/sec

SYSTOLIC ICA/CCA RATIO:

DIASTOLIC ICA/CCA RATIO:

ECA:  138 cm/sec

LEFT

ICA:  78/21 cm/sec

CCA:  81/13 cm/sec

SYSTOLIC ICA/CCA RATIO:

DIASTOLIC ICA/CCA RATIO:

ECA:  74 cm/sec

RIGHT CAROTID ARTERY: Generalized intimal thickening distal common
carotid artery. Soft plaque in the bulb and internal carotid artery.

RIGHT VERTEBRAL ARTERY:  Antegrade flow

LEFT CAROTID ARTERY: Intimal thickening distal common carotid artery
with soft plaque in the bulb extending into the proximal internal
carotid arteries.

LEFT VERTEBRAL ARTERY:  Antegrade flow
IMPRESSION: Estimate of 50-69% narrowing on the right. No significant
hemodynamic narrowing on the left.More precise estimate of narrowing
could be obtained with CTA if indicated.

## 2015-11-01 NOTE — Therapy (Signed)
[place] MAIN [facility] SERVICES [address] [place], [place], [address] Phone: [phone]   Fax:  [phone]  Speech Language Pathology Evaluation  Patient Details  Name: Mike Decker MRN: [id number] Date of Birth: 1941-06-19 Referring Provider: Gurney Maxin, MD  Encounter Date: 10/31/2015      End of Session - 11/01/15 1556    Visit Number 1   Number of Visits 17   Date for SLP Re-Evaluation 01/01/16   SLP Start Time 65   SLP Stop Time  1450   SLP Time Calculation (min) 50 min   Activity Tolerance Patient tolerated treatment well      Past Medical History  Diagnosis Date   Arthritis    Colon polyp    Diabetes mellitus without complication ([place])    Hyperlipidemia    Myocardial infarct ([place])    Heart disease    Hypertension    Atrioventricular canal (AVC)     irregular heart beats   Diverticulosis    Barrett esophagus    Sleep apnea    Gout    Vitreoretinal degeneration    Ocular hypertension    Hemangioma     liver   Skin cancer    Cancer ([place]) 2002    prostate   Peripheral vascular disease [facility])     Past Surgical History  Procedure Laterality Date   Prostate surgery  2002   Coronary angioplasty with stent placement  2012   Intraocular lens insertion     Hernia repair      x2   Cataract extraction  2011, 2012   Rotator cuff repair Right    Nose surgery      submucous resection    There were no vitals filed for this visit.  Visit Diagnosis: Dysarthria - Plan: SLP plan of care cert/re-cert      Subjective Assessment - 11/01/15 1552    Subjective The patient would like to improve his intelligibility at the conversational level.    Currently in Pain? No/denies            SLP Evaluation [facility] - 11/01/15 0001    SLP Visit Information   SLP Received On 10/31/15   Referring Provider Gurney Maxin, MD   Onset Date 10/05/2015   Medical Diagnosis CVA   Pain Assessment    Currently in Pain? No/denies   Prior Functional Status   Cognitive/Linguistic Baseline Within functional limits   Oral Motor/Sensory Function   Overall Oral Motor/Sensory Function Impaired   Labial Strength Within Functional Limits   Labial Sensation Reduced Left   Labial Coordination Other (comment)  At slowed rate   Lingual ROM Within Functional Limits   Lingual Sensation Reduced   Lingual Coordination Other (comment)  At slowed rate   Mandible Impaired   Overall Oral Motor/Sensory Function Mild   Motor Speech   Overall Motor Speech Impaired   Respiration Impaired   Level of Impairment Conversation   Phonation Aphonic;Breathy;Hoarse;Low vocal intensity  strained   Resonance Hypernasality   Articulation Impaired   Level of Impairment Conversation   Intelligibility Intelligible   Motor Planning Witnin functional limits   Motor Speech Errors Not applicable   Effective Techniques Slow rate;Increased vocal intensity   Phonation Impaired   Vocal Abuses Habitual Hyperphonia;Glottal Attack   Tension Present Shoulder;Neck   Volume Soft   Pitch Low       Visi-Pitch: Multi-Dimensional Voice Program (MDVP)  MDVP extracts objective quantitative values (Relative Average Perturbation, Shimmer, Voice Turbulence  Index, and Noise to Harmonic Ratio) on sustained phonation, which are displayed graphically and numerically in comparison to a built-in normative database.  The patient exhibited values outside the norm for Relative Average Perturbation, Shimmer, , and Noise to Harmonic Ratio.  Average fundamental frequency was 1.7 STD below the average for age and gender. The patient improved all parameters when cued to alter voicing (loud and high pitched like me).            SLP Education - Nov 15, 2015 1556    Education provided Yes   Education Details importance of breath support for improve voice   Person(s) Educated Patient   Methods Explanation   Comprehension Verbalized  understanding            SLP Long Term Goals - 11-15-2015 1603    SLP LONG TERM GOAL #1   Title The patient will demonstrate independent understanding of vocal hygiene concepts and neck, shoulder, lingual stretching exercises.   Time 8   Period Weeks   Status New   SLP LONG TERM GOAL #2   Title The patient will be independent for abdominal breathing and breath support exercises.   Time 8   Period Weeks   Status New   SLP LONG TERM GOAL #3   Title The patient will maximize voice quality and loudness using breath support for sustained vowel production, pitch glides, and hierarchal speech drill.   Time 8   Period Weeks   Status New   SLP LONG TERM GOAL #4   Title The patient will maximize voice quality and loudness using breath support for paragraph length recitation with 80% accuracy.   Time 8   Period Weeks   Status New          Plan - 2015-11-15 1558    Clinical Impression Statement At 3 weeks post CVA, the patient is presenting with moderate dysarthria characterized by moderate-severe hoarseness and minimally slurred speech. The patient is slowing the rate of speech as a compensatory measure to increase intelligibility and successfully utilizes it so that there is a minimal effect on overall intelligibility of his speech.  However, the hoarseness is characterized as moderate-severe and negatively affects his intelligibility. He will benefit from voice therapy for education, to improve breath support, improve tone focus, promote easy flow phonation, and learn techniques to increase loudness and pitch range without strain. The patient's stimulability for loudness indicates a good prognosis for his potential progress.    Speech Therapy Frequency 2x / week   Duration Other (comment)  8 weeks   Treatment/Interventions Other (comment)  Voice therapy   Potential to Achieve Goals Good   Potential Considerations Ability to learn/carryover information;Cooperation/participation  level;Previous level of function;Family/community support;Other (comment)  Stimulability   SLP Home Exercise Plan To be developed   Consulted and Agree with Plan of Care Patient          G-Codes - 11/15/15 1606    Functional Assessment Tool Used Motor Speech Evaluation   Functional Limitations Voice   Voice Current Status [phone]) At least 60 percent but less than 80 percent impaired, limited or restricted   Voice Goal Status [id number]) At least 20 percent but less than 40 percent impaired, limited or restricted      Problem List Patient Active Problem List   Diagnosis Date Noted   TIA (transient ischemic attack) 10/08/2015   Leroy Sea, MS/CCC- SLP  [name] 2015/11/15, 4:21 PM  [place]  [place], [place], [address] Phone: [phone]   Fax:  [phone]  Name: Mike Decker MRN: [id number] Date of Birth: 1941/02/08

## 2015-11-07 ENCOUNTER — Encounter: Payer: Self-pay | Admitting: Speech Pathology

## 2015-11-07 ENCOUNTER — Ambulatory Visit: Payer: Medicare Other | Admitting: Speech Pathology

## 2015-11-07 DIAGNOSIS — R471 Dysarthria and anarthria: Secondary | ICD-10-CM

## 2015-11-07 NOTE — Therapy (Signed)
[place] MAIN [facility] SERVICES [address] [place], [place], [address] Phone: [phone]   Fax:  [phone]  Speech Language Pathology Treatment  Patient Details  Name: Mike Decker MRN: [id number] Date of Birth: [birth year] Referring Provider: Gurney Maxin, MD  Encounter Date: 11/07/2015    Past Medical History  Diagnosis Date  . Arthritis   . Colon polyp   . Diabetes mellitus without complication ([place])   . Hyperlipidemia   . Myocardial infarct ([place])   . Heart disease   . Hypertension   . Atrioventricular canal (AVC)     irregular heart beats  . Diverticulosis   . Barrett esophagus   . Sleep apnea   . Gout   . Vitreoretinal degeneration   . Ocular hypertension   . Hemangioma     liver  . Skin cancer   . Cancer [facility]) 2002    prostate  . Peripheral vascular disease [facility])     Past Surgical History  Procedure Laterality Date  . Prostate surgery  2002  . Coronary angioplasty with stent placement  2012  . Intraocular lens insertion    . Hernia repair      x2  . Cataract extraction  2011, 2012  . Rotator cuff repair Right   . Nose surgery      submucous resection    There were no vitals filed for this visit.  Visit Diagnosis: No diagnosis found.             ADULT SLP TREATMENT - 11/07/15 0001    General Information   Behavior/Cognition Alert;Cooperative;Pleasant mood   Treatment Provided   Treatment provided Cognitive-Linquistic   Cognitive-Linquistic Treatment   Treatment focused on Dysarthria   Skilled Treatment The patient was provided with written and verbal teaching regarding neck, tongue, and throat stretches to promote relaxed phonation. The patient was provided with written and verbal teaching regarding breath support exercises. Patient demonstrates accurate execution of exercises. Patient instructed in relaxed phonation / oral resonance. Patient is able to generate clear, resonant hum.   Assessment / Recommendations / Plan   Plan Continue with current plan of care   Progression Toward Goals   Progression toward goals Progressing toward goals          SLP Education - 11/07/15 1656    Education provided Yes   Education Details Neck, shoulder, vocal tract, breath support exercises   Person(s) Educated Patient   Methods Explanation;Demonstration   Comprehension Verbalized understanding;Returned demonstration;Need further instruction            SLP Long Term Goals - 11/01/15 1603    SLP LONG TERM GOAL #1   Title The patient will demonstrate independent understanding of vocal hygiene concepts and neck, shoulder, lingual stretching exercises.   Time 8   Period Weeks   Status New   SLP LONG TERM GOAL #2   Title The patient will be independent for abdominal breathing and breath support exercises.   Time 8   Period Weeks   Status New   SLP LONG TERM GOAL #3   Title The patient will maximize voice quality and loudness using breath support for sustained vowel production, pitch glides, and hierarchal speech drill.   Time 8   Period Weeks   Status New   SLP LONG TERM GOAL #4   Title The patient will maximize voice quality and loudness using breath support for paragraph length recitation with 80% accuracy.   Time 8   Period  Weeks   Status New          Plan - 11/07/15 1657    Clinical Impression Statement The patient continues to present with moderate dysarthria characterized by moderate-severe hoarseness and minimally slurred speech. The patient is slowing the rate of speech as a compensatory measure to increase intelligibility and successfully utilizes it so that there is a minimal effect on overall intelligibility of his speech.  However, the hoarseness is characterized as moderate-severe and negatively affects his intelligibility. He will benefit from voice therapy for education, to improve breath support, improve tone focus, promote easy flow phonation, and  learn techniques to increase loudness and pitch range without strain. The patient's stimulability for loudness indicates a good prognosis for his potential progress.    Speech Therapy Frequency 2x / week   Duration Other (comment)  8 weeks   Treatment/Interventions Other (comment)  Voice therapy   Potential to Achieve Goals Good   Potential Considerations Ability to learn/carryover information;Cooperation/participation level;Previous level of function;Family/community support;Other (comment)  Stimulability   SLP Home Exercise Plan To be developed   Consulted and Agree with Plan of Care Patient        Problem List Patient Active Problem List   Diagnosis Date Noted  . TIA (transient ischemic attack) 10/08/2015    Rocco Serene 11/07/2015, 4:58 PM  [place] MAIN [facility] SERVICES [address] [place], [place], [address] Phone: [phone]   Fax:  [phone]   Name: Mike Decker MRN: [id number] Date of Birth: 04/11/1941

## 2015-11-10 ENCOUNTER — Ambulatory Visit: Payer: Medicare Other | Attending: Neurology | Admitting: Speech Pathology

## 2015-11-10 DIAGNOSIS — R531 Weakness: Secondary | ICD-10-CM | POA: Diagnosis not present

## 2015-11-10 DIAGNOSIS — R2681 Unsteadiness on feet: Secondary | ICD-10-CM | POA: Diagnosis not present

## 2015-11-10 DIAGNOSIS — R471 Dysarthria and anarthria: Secondary | ICD-10-CM | POA: Diagnosis not present

## 2015-11-11 ENCOUNTER — Encounter: Payer: Self-pay | Admitting: Speech Pathology

## 2015-11-11 NOTE — Therapy (Signed)
[place] MAIN [facility] SERVICES [address] [place], [place], [address] Phone: [phone]   Fax:  [phone]  Speech Language Pathology Treatment  Patient Details  Name: Mike Decker MRN: [id number] Date of Birth: 1941-04-25 Referring Provider: Gurney Maxin, MD  Encounter Date: 11/10/2015      End of Session - 11/11/15 1302    Visit Number 3   Number of Visits 17   Date for SLP Re-Evaluation 01/01/16   SLP Start Time 1500   SLP Stop Time  1547   SLP Time Calculation (min) 47 min   Activity Tolerance Patient tolerated treatment well      Past Medical History  Diagnosis Date  . Arthritis   . Colon polyp   . Diabetes mellitus without complication ([place])   . Hyperlipidemia   . Myocardial infarct ([place])   . Heart disease   . Hypertension   . Atrioventricular canal (AVC)     irregular heart beats  . Diverticulosis   . Barrett esophagus   . Sleep apnea   . Gout   . Vitreoretinal degeneration   . Ocular hypertension   . Hemangioma     liver  . Skin cancer   . Cancer [facility]) 2002    prostate  . Peripheral vascular disease [facility])     Past Surgical History  Procedure Laterality Date  . Prostate surgery  2002  . Coronary angioplasty with stent placement  2012  . Intraocular lens insertion    . Hernia repair      x2  . Cataract extraction  2011, 2012  . Rotator cuff repair Right   . Nose surgery      submucous resection    There were no vitals filed for this visit.  Visit Diagnosis: Dysarthria      Subjective Assessment - 11/11/15 1301    Subjective "I'm going to have to practice that" regarding diaphragmatic breathing and breath support exercises.   Currently in Pain? No/denies               ADULT SLP TREATMENT - 11/11/15 0001    General Information   Behavior/Cognition Alert;Cooperative;Pleasant mood   Treatment Provided   Treatment provided Cognitive-Linquistic   Cognitive-Linquistic Treatment   Treatment focused on Dysarthria   Skilled Treatment The patient was provided with written and verbal teaching regarding neck, tongue, and throat stretches to promote relaxed phonation.  The patient is performing well.  The patient was provided with written and verbal teaching regarding breath support exercises.  The patient demonstrates use of shoulders when attempting to breathe deeply.  Patient instructed in relaxed phonation / oral resonance.  Patient demonstrates improve vocal quality with additional instruction and model with constant flow phonation in combination with vocal loudness.  He has acquired a habit of shallow breaths and constricting air flow through the glottis.     Assessment / Recommendations / Plan   Plan Continue with current plan of care   Progression Toward Goals   Progression toward goals Progressing toward goals          SLP Education - 11/11/15 1301    Education Details neck and tongue stretches, breath support exercises, oral resonance, vocal loudness, constant flow phonation   Person(s) Educated Patient   Methods Explanation;Demonstration;Verbal cues;Handout   Comprehension Verbalized understanding;Returned demonstration;Verbal cues required;Need further instruction            SLP Long Term Goals - 11/01/15 1603    SLP LONG TERM GOAL #  1   Title The patient will demonstrate independent understanding of vocal hygiene concepts and neck, shoulder, lingual stretching exercises.   Time 8   Period Weeks   Status New   SLP LONG TERM GOAL #2   Title The patient will be independent for abdominal breathing and breath support exercises.   Time 8   Period Weeks   Status New   SLP LONG TERM GOAL #3   Title The patient will maximize voice quality and loudness using breath support for sustained vowel production, pitch glides, and hierarchal speech drill.   Time 8   Period Weeks   Status New   SLP LONG TERM GOAL #4   Title The patient will maximize voice quality and  loudness using breath support for paragraph length recitation with 80% accuracy.   Time 8   Period Weeks   Status New          Plan - 11/11/15 1303    Clinical Impression Statement  Patient able to improve vocal quality with constant flow phonation in combination with vocal loudness.   Speech Therapy Frequency 2x / week   Duration Other (comment)   Treatment/Interventions Other (comment)  Voice therapy   Potential to Achieve Goals Good   Potential Considerations Ability to learn/carryover information;Cooperation/participation level;Previous level of function;Family/community support;Other (comment)   SLP Home Exercise Plan neck and tongue stretches, breath support exercises, oral resonance, vocal loudness, constant flow phonation   Consulted and Agree with Plan of Care Patient        Problem List Patient Active Problem List   Diagnosis Date Noted  . TIA (transient ischemic attack) 10/08/2015   Leroy Sea, MS/CCC- SLP  [name] 11/11/2015, 1:04 PM  [place] MAIN [facility] SERVICES [address] [place], [place], [address] Phone: [phone]   Fax:  [phone]   Name: Mike Decker MRN: [id number] Date of Birth: 02/08/1941

## 2015-11-15 ENCOUNTER — Ambulatory Visit: Payer: Medicare Other | Admitting: Physical Therapy

## 2015-11-15 ENCOUNTER — Encounter: Payer: Self-pay | Admitting: Physical Therapy

## 2015-11-15 ENCOUNTER — Ambulatory Visit: Payer: Medicare Other | Admitting: Speech Pathology

## 2015-11-15 DIAGNOSIS — R471 Dysarthria and anarthria: Secondary | ICD-10-CM | POA: Diagnosis not present

## 2015-11-15 DIAGNOSIS — R531 Weakness: Secondary | ICD-10-CM

## 2015-11-15 DIAGNOSIS — I214 Non-ST elevation (NSTEMI) myocardial infarction: Secondary | ICD-10-CM | POA: Diagnosis not present

## 2015-11-15 DIAGNOSIS — E119 Type 2 diabetes mellitus without complications: Secondary | ICD-10-CM | POA: Diagnosis not present

## 2015-11-15 DIAGNOSIS — R2681 Unsteadiness on feet: Secondary | ICD-10-CM

## 2015-11-15 DIAGNOSIS — I6990 Unspecified sequelae of unspecified cerebrovascular disease: Secondary | ICD-10-CM | POA: Diagnosis not present

## 2015-11-15 NOTE — Therapy (Signed)
[place] MAIN [facility] SERVICES [address] [place], [place], [address] Phone: [phone]   Fax:  [phone]  Physical Therapy Evaluation  Patient Details  Name: Mike Decker MRN: [id number] Date of Birth: 12-19-1940 Referring Provider: Melrose Nakayama  Encounter Date: 11/15/2015      PT End of Session - 11/15/15 1416    Visit Number 1   Number of Visits 25   Date for PT Re-Evaluation Feb 13, 2016   Authorization Type g codes   PT Start Time 0200   PT Stop Time 0300   PT Time Calculation (min) 60 min   Equipment Utilized During Treatment Gait belt   Activity Tolerance Patient tolerated treatment well      Past Medical History  Diagnosis Date  . Arthritis   . Colon polyp   . Diabetes mellitus without complication ([place])   . Hyperlipidemia   . Myocardial infarct ([place])   . Heart disease   . Hypertension   . Atrioventricular canal (AVC)     irregular heart beats  . Diverticulosis   . Barrett esophagus   . Sleep apnea   . Gout   . Vitreoretinal degeneration   . Ocular hypertension   . Hemangioma     liver  . Skin cancer   . Cancer [facility]) 2002    prostate  . Peripheral vascular disease [facility])     Past Surgical History  Procedure Laterality Date  . Prostate surgery  2002  . Coronary angioplasty with stent placement  2012  . Intraocular lens insertion    . Hernia repair      x2  . Cataract extraction  2011, 2012  . Rotator cuff repair Right   . Nose surgery      submucous resection    There were no vitals filed for this visit.  Visit Diagnosis:  Unsteady gait  Weakness      Subjective Assessment - 11/15/15 1410    Subjective Patient had a cva 10/08/15 and he is having difficulty with walking.    Currently in Pain? No/denies            [facility] PT Assessment - 11/15/15 0001    Assessment   Medical Diagnosis CVA   Referring Provider [place]ter   Onset Date/Surgical Date 10/08/15   Hand Dominance Right   Next MD Visit  12/11/15   [facility]   Precautions   Precautions None   Balance Screen   Has the patient fallen in the past 6 months No   Has the patient had a decrease in activity level because of a fear of falling?  Yes   Is the patient reluctant to leave their home because of a fear of falling?  No   Home Social worker Private residence   Living Arrangements Spouse/significant other;Children   Available Help at Discharge Family   Type of [place] to enter   Entrance Stairs-Number of Steps 3   [place] One level   [place] - 4 wheels;Grab bars - tub/shower;Grab bars - toilet;Shower seat   Prior Function   Level of Independence Independent      PAIN: 5/10 low back  POSTURE: fwd head/ flexed trunk   PROM/AROM: WFLSTRENGTH:  Graded on a 0-5 scale Muscle Group Left Right  Shoulder flex 4 4  Shoulder Abd 4 4  Shoulder Ext 4 4  Shoulder IR/ER 4 4  Elbow 5 5  Wrist/hand 5 5  Hip Flex 3 3  Hip Abd 3 3  Hip Add 2 2  Hip Ext 2 2  Hip IR/ER 3 3  Knee Flex 3 3  Knee Ext 3 3  Ankle DF 4 4  Ankle PF 4 4   SENSATION: numbness LLE knee to ankle      FUNCTIONAL MOBILITY: Difficulty with  Mobility sit to stand unsteady  BALANCE: unable to tandem stand and unable to single leg stand   GAIT: Patient ambulates with spc and decreased gait speed.   OUTCOME MEASURES: TEST Outcome Interpretation  5 times sit<>stand 22.86sec >50 yo, >15 sec indicates increased risk for falls  10 meter walk test      .74           m/s <1.0 m/s indicates increased risk for falls; limited community ambulator  Timed up and Go 14.44                sec <14 sec indicates increased risk for falls  6 minute walk test                Feet 1000 feet is community Water quality scientist  <36/56 (100% risk for falls), 37-45 (80% risk for falls); 46-51 (>50% risk for falls); 52-55 (lower risk <25% of  falls)  9 Hole Peg Test L:                R:                            PT Education - 11/15/15 1416    Education provided Yes   Education Details HEP   Person(s) Educated Patient   Methods Explanation   Comprehension Verbalized understanding             PT Long Term Goals - 11/15/15 1355    PT LONG TERM GOAL #1   Title Patient will be independent in home exercise program to improve strength/mobility for better functional independence with ADLs   Time 12   Period Weeks   Status New   PT LONG TERM GOAL #2   Title Patient will tolerate 5 seconds of single leg stance without loss of balance to improve ability to get in and out of shower safely   Time 12   Period Weeks   Status New   PT LONG TERM GOAL #3   Title Patient will reduce timed up and go to <11 seconds to reduce fall risk and demonstrate improved transfer/gait ability   Time 12   Status New   PT LONG TERM GOAL #4   Title Patient will increase 10 meter walk test to >1.82m/s as to improve gait speed for better community ambulation and to reduce fall risk   Time 12   Status New               Plan - 11/15/15 1429    Clinical Impression Statement Patient is 74 yr old male s/p cva with difficulty walking, unsteady gait and dynamic stanidng balance deficits. He has decreased outcome measures that indicate increased falls risk.    Pt will benefit from skilled therapeutic intervention in order to improve on the following deficits Abnormal gait;Decreased mobility;Impaired sensation;Decreased activity tolerance;Decreased coordination;Decreased strength;Pain   Rehab Potential Good   PT Frequency 2x / week   PT Duration 12 weeks   PT Treatment/Interventions Electrical Stimulation;Moist Heat;Ultrasound;Stair training;Gait training;Neuromuscular  re-education;Balance training;Therapeutic exercise;Therapeutic activities   PT Next Visit Plan Strengtening LE's, balance traiing   PT Home Exercise Plan HEP    Consulted and Agree with Plan of Care Patient          G-Codes - December 03, 2015 1432    Functional Assessment Tool Used 6 mw, 5 x sit to stand, TUG, 10 MW   Functional Limitation Mobility: Walking and moving around   Mobility: Walking and Moving Around Current Status [phone]) At least 40 percent but less than 60 percent impaired, limited or restricted   Mobility: Walking and Moving Around Goal Status [phone]) At least 20 percent but less than 40 percent impaired, limited or restricted       Problem List Patient Active Problem List   Diagnosis Date Noted  . TIA (transient ischemic attack) 10/08/2015    Alanson Puls 03-Dec-2015, 2:48 PM  [place] MAIN [facility] SERVICES [place], [place], [address] Phone: [phone]   Fax:  [phone]  Name: Mike Decker MRN: [id number] Date of Birth: Oct 09, 1941

## 2015-11-16 ENCOUNTER — Encounter: Payer: Self-pay | Admitting: Speech Pathology

## 2015-11-16 NOTE — Therapy (Signed)
[place] MAIN [facility] SERVICES [address] [place], [place], [address] Phone: [phone]   Fax:  [phone]  Speech Language Pathology Treatment  Patient Details  Name: Mike Decker MRN: [id number] Date of Birth: 06-19-1941 Referring Provider: Gurney Maxin, MD  Encounter Date: 11/15/2015      End of Session - 11/16/15 0853    Visit Number 4   Number of Visits 17   Date for SLP Re-Evaluation 01/01/16   SLP Start Time 9   SLP Stop Time  0800   SLP Time Calculation (min) 1130 min   Activity Tolerance Patient tolerated treatment well      Past Medical History  Diagnosis Date  . Arthritis   . Colon polyp   . Diabetes mellitus without complication ([place])   . Hyperlipidemia   . Myocardial infarct ([place])   . Heart disease   . Hypertension   . Atrioventricular canal (AVC)     irregular heart beats  . Diverticulosis   . Barrett esophagus   . Sleep apnea   . Gout   . Vitreoretinal degeneration   . Ocular hypertension   . Hemangioma     liver  . Skin cancer   . Cancer [facility]) 2002    prostate  . Peripheral vascular disease [facility])     Past Surgical History  Procedure Laterality Date  . Prostate surgery  2002  . Coronary angioplasty with stent placement  2012  . Intraocular lens insertion    . Hernia repair      x2  . Cataract extraction  2011, 2012  . Rotator cuff repair Right   . Nose surgery      submucous resection    There were no vitals filed for this visit.  Visit Diagnosis: Dysarthria      Subjective Assessment - 11/16/15 0853    Subjective "I'm going to have to practice that" regarding diaphragmatic breathing and breath support exercises.               ADULT SLP TREATMENT - 11/16/15 0001    General Information   Behavior/Cognition Alert;Cooperative;Pleasant mood   Treatment Provided   Treatment provided Cognitive-Linquistic   Pain Assessment   Pain Assessment No/denies pain   Cognitive-Linquistic Treatment   Treatment focused on Dysarthria   Skilled Treatment The patient was provided with written and verbal teaching regarding neck, tongue, and throat stretches to promote relaxed phonation.  The patient is performing well.  The patient was provided with written and verbal teaching regarding breath support exercises.  The patient demonstrates use of shoulders when attempting to breathe deeply.  Patient instructed in relaxed phonation / oral resonance.  Patient demonstrates improve vocal quality with additional instruction and model with constant flow phonation in combination with vocal loudness.  He has acquired a habit of shallow breaths and constricting air flow through the glottis.  Read aloud sentences with loud, good quality voice with 90% accuracy, minimal generalization to spontaneous, "off-the-cuff" remarks.   Assessment / Recommendations / Plan   Plan Continue with current plan of care   Progression Toward Goals   Progression toward goals Progressing toward goals          SLP Education - 11/16/15 0853    Education provided Yes   Education Details neck and tongue stretches, breath support exercises, oral resonance, vocal loudness, constant flow phonation   Person(s) Educated Patient   Methods Explanation;Demonstration;Verbal cues;Handout   Comprehension Verbalized understanding;Returned demonstration;Verbal cues required;Need further instruction  SLP Long Term Goals - 11/01/15 1603    SLP LONG TERM GOAL #1   Title The patient will demonstrate independent understanding of vocal hygiene concepts and neck, shoulder, lingual stretching exercises.   Time 8   Period Weeks   Status New   SLP LONG TERM GOAL #2   Title The patient will be independent for abdominal breathing and breath support exercises.   Time 8   Period Weeks   Status New   SLP LONG TERM GOAL #3   Title The patient will maximize voice quality and loudness using breath support  for sustained vowel production, pitch glides, and hierarchal speech drill.   Time 8   Period Weeks   Status New   SLP LONG TERM GOAL #4   Title The patient will maximize voice quality and loudness using breath support for paragraph length recitation with 80% accuracy.   Time 8   Period Weeks   Status New          Plan - 11/16/15 [id number]    Clinical Impression Statement  Patient able to improve vocal quality with constant flow phonation in combination with vocal loudness.  Patient demonstrates loud, good quality voice in structured reading task, but not into spontaneous, unstructured speaking tasks.   Speech Therapy Frequency 2x / week   Duration Other (comment)  Voice therapy   Potential to Achieve Goals Good   Potential Considerations Ability to learn/carryover information;Cooperation/participation level;Previous level of function;Family/community support;Other (comment)   SLP Home Exercise Plan neck and tongue stretches, breath support exercises, oral resonance, vocal loudness, constant flow phonation   Consulted and Agree with Plan of Care Patient        Problem List Patient Active Problem List   Diagnosis Date Noted  . TIA (transient ischemic attack) 10/08/2015   Leroy Sea, MS/CCC- SLP  [name] 11/16/2015, 8:55 AM  [place] MAIN [facility] SERVICES [place], [place], [address] Phone: [phone]   Fax:  [phone]   Name: Mike Decker MRN: [id number] Date of Birth: 10-20-1941

## 2015-11-17 ENCOUNTER — Ambulatory Visit: Payer: Medicare Other | Admitting: Speech Pathology

## 2015-11-17 ENCOUNTER — Encounter: Payer: Self-pay | Admitting: Speech Pathology

## 2015-11-17 ENCOUNTER — Encounter: Payer: Self-pay | Admitting: Physical Therapy

## 2015-11-17 ENCOUNTER — Ambulatory Visit: Payer: Medicare Other | Admitting: Physical Therapy

## 2015-11-17 DIAGNOSIS — R531 Weakness: Secondary | ICD-10-CM | POA: Diagnosis not present

## 2015-11-17 DIAGNOSIS — R2681 Unsteadiness on feet: Secondary | ICD-10-CM

## 2015-11-17 DIAGNOSIS — R471 Dysarthria and anarthria: Secondary | ICD-10-CM

## 2015-11-17 NOTE — Therapy (Signed)
[place] MAIN [facility] SERVICES [address] [place], [place], [address] Phone: [phone]   Fax:  [phone]  Physical Therapy Treatment  Patient Details  Name: Mike Decker MRN: [id number] Date of Birth: 1941-11-21 Referring Provider: Melrose Nakayama  Encounter Date: 11/17/2015      PT End of Session - 11/17/15 1410    Visit Number 2   Number of Visits 25   Date for PT Re-Evaluation 03/06/16   Authorization Type g codes   PT Start Time 0200   PT Stop Time 0245   PT Time Calculation (min) 45 min   Equipment Utilized During Treatment Gait belt   Activity Tolerance Patient tolerated treatment well      Past Medical History  Diagnosis Date  . Arthritis   . Colon polyp   . Diabetes mellitus without complication ([place])   . Hyperlipidemia   . Myocardial infarct ([place])   . Heart disease   . Hypertension   . Atrioventricular canal (AVC)     irregular heart beats  . Diverticulosis   . Barrett esophagus   . Sleep apnea   . Gout   . Vitreoretinal degeneration   . Ocular hypertension   . Hemangioma     liver  . Skin cancer   . Cancer [facility]) 2002    prostate  . Peripheral vascular disease [facility])     Past Surgical History  Procedure Laterality Date  . Prostate surgery  2002  . Coronary angioplasty with stent placement  2012  . Intraocular lens insertion    . Hernia repair      x2  . Cataract extraction  2011, 2012  . Rotator cuff repair Right   . Nose surgery      submucous resection    There were no vitals filed for this visit.  Visit Diagnosis:  Unsteady gait  Weakness      Subjective Assessment - 11/17/15 1409    Subjective Patient has weakness and unsteady gait .       Neuromuscular Re-education   Marching in place on blue foam pad x 30 seconds for 2 sets   Tandem standing in // bars  x 1 minute Step ups to blue foam pad x 10 bilaterally for 2 sets bilaterally   Heel raises from 2 feet transitioning to TTWB on one  foot. X 12 bilaterally for 1 set Sit to stand training x5 repetitions for 3 sets biodex x 5 reps fwd/bwd/side stepping Four square stepping.side to side, front and back and diagonals Feet together on blue foam and holding ball x 1 minute, holding ball and fwd and bwd movement elbow flex/ext x 20, horizontal abd/add with ball and arms extended. balloon toss at parallel bars x 5 minutes  Therapeutic exercise; Leg press x 20 x 3 130 lbs, heel raises with 90 lbs x 20 x 3 Heel raises standing  4 way hip RTB x  Patient needs occasional verbal cueing to improve posture and cueing to correctly perform exercises slowly, holding at end of range to increase motor firing of desired muscle to encourage fatigue. Min cueing needed to appropriately perform tasks with leg, hand, and head position. Patient responds well to verbal and tactile cues to correct form and technique.  CGA to SBA for safety with activities.  Uses to increase intensity and amplitude of movements throughout session  PT Education - 11/17/15 1410    Education provided Yes   Education Details HEP   Person(s) Educated Patient   Methods Explanation   Comprehension Verbalized understanding             PT Long Term Goals - 11/15/15 1355    PT LONG TERM GOAL #1   Title Patient will be independent in home exercise program to improve strength/mobility for better functional independence with ADLs   Time 12   Period Weeks   Status New   PT LONG TERM GOAL #2   Title Patient will tolerate 5 seconds of single leg stance without loss of balance to improve ability to get in and out of shower safely   Time 12   Period Weeks   Status New   PT LONG TERM GOAL #3   Title Patient will reduce timed up and go to <11 seconds to reduce fall risk and demonstrate improved transfer/gait ability   Time 12   Status New   PT LONG TERM GOAL #4   Title Patient will increase 10 meter walk test to >1.38m/s  as to improve gait speed for better community ambulation and to reduce fall risk   Time 12   Status New               Plan - 11/17/15 1411    Clinical Impression Statement PT provided min - moderate verbal instruction to improve set up, proper use of LE, and improved posture and gait mechanics. Patient responded moderately to instruction   Pt will benefit from skilled therapeutic intervention in order to improve on the following deficits Abnormal gait;Decreased mobility;Impaired sensation;Decreased activity tolerance;Decreased coordination;Decreased strength;Pain   Rehab Potential Good   PT Frequency 2x / week   PT Duration 12 weeks   PT Treatment/Interventions Electrical Stimulation;Moist Heat;Ultrasound;Stair training;Gait training;Neuromuscular re-education;Balance training;Therapeutic exercise;Therapeutic activities   PT Next Visit Plan Strengtening LE's, balance traiing   PT Home Exercise Plan HEP   Consulted and Agree with Plan of Care Patient        Problem List Patient Active Problem List   Diagnosis Date Noted  . TIA (transient ischemic attack) 10/08/2015    Alanson Puls 11/17/2015, 2:51 PM  [place] MAIN [facility] SERVICES [address] [place], [place], [address] Phone: [phone]   Fax:  [phone]  Name: Mike Decker MRN: [id number] Date of Birth: 08-Feb-1941

## 2015-11-17 NOTE — Therapy (Signed)
[place] MAIN [facility] SERVICES [address] [place], [place], [address] Phone: [phone]   Fax:  [phone]  Speech Language Pathology Treatment  Patient Details  Name: Mike Decker MRN: [id number] Date of Birth: Mar 13, 1941 Referring Provider: Gurney Maxin, MD  Encounter Date: 11/17/2015      End of Session - 11/17/15 1420    Visit Number 5   Number of Visits 17   Date for SLP Re-Evaluation 01/01/16   SLP Start Time 1300   SLP Stop Time  1356   SLP Time Calculation (min) 56 min   Activity Tolerance Patient tolerated treatment well      Past Medical History  Diagnosis Date  . Arthritis   . Colon polyp   . Diabetes mellitus without complication ([place])   . Hyperlipidemia   . Myocardial infarct ([place])   . Heart disease   . Hypertension   . Atrioventricular canal (AVC)     irregular heart beats  . Diverticulosis   . Barrett esophagus   . Sleep apnea   . Gout   . Vitreoretinal degeneration   . Ocular hypertension   . Hemangioma     liver  . Skin cancer   . Cancer [facility]) 2002    prostate  . Peripheral vascular disease [facility])     Past Surgical History  Procedure Laterality Date  . Prostate surgery  2002  . Coronary angioplasty with stent placement  2012  . Intraocular lens insertion    . Hernia repair      x2  . Cataract extraction  2011, 2012  . Rotator cuff repair Right   . Nose surgery      submucous resection    There were no vitals filed for this visit.  Visit Diagnosis: Dysarthria      Subjective Assessment - 11/17/15 1418    Subjective "My family thinks I'm too loud"   Currently in Pain? No/denies               ADULT SLP TREATMENT - 11/17/15 0001    General Information   Behavior/Cognition Alert;Cooperative;Pleasant mood   Treatment Provided   Treatment provided Cognitive-Linquistic   Pain Assessment   Pain Assessment No/denies pain   Cognitive-Linquistic Treatment   Treatment focused  on Dysarthria   Skilled Treatment The patient was provided with written and verbal teaching regarding neck, tongue, and throat stretches to promote relaxed phonation.  The patient is performing well.  The patient was provided with written and verbal teaching regarding breath support exercises.  The patient demonstrates use of shoulders when attempting to breathe deeply.  Patient instructed in relaxed phonation / oral resonance.  Patient demonstrates improve vocal quality with additional instruction and model with constant flow phonation in combination with vocal loudness.  He has acquired a habit of shallow breaths and constricting air flow through the glottis.  Read aloud sentences with loud, good quality voice with 90% accuracy. Maintain loud, good quality voice in conversation with 70% accuracy.   Assessment / Recommendations / Plan   Plan Continue with current plan of care   Progression Toward Goals   Progression toward goals Progressing toward goals          SLP Education - 11/17/15 1419    Education provided Yes   Education Details Continue to practice the loud to improve vocal quality and learn better breath support, we can pull back on loudness when you have better control of air flow   Person(s)  Educated Patient   Comprehension Verbalized understanding            SLP Long Term Goals - 11/01/15 1603    SLP LONG TERM GOAL #1   Title The patient will demonstrate independent understanding of vocal hygiene concepts and neck, shoulder, lingual stretching exercises.   Time 8   Period Weeks   Status New   SLP LONG TERM GOAL #2   Title The patient will be independent for abdominal breathing and breath support exercises.   Time 8   Period Weeks   Status New   SLP LONG TERM GOAL #3   Title The patient will maximize voice quality and loudness using breath support for sustained vowel production, pitch glides, and hierarchal speech drill.   Time 8   Period Weeks   Status New   SLP  LONG TERM GOAL #4   Title The patient will maximize voice quality and loudness using breath support for paragraph length recitation with 80% accuracy.   Time 8   Period Weeks   Status New          Plan - 11/17/15 1420    Clinical Impression Statement  Patient able to improve vocal quality with constant flow phonation in combination with vocal loudness.  Patient demonstrates loud, good quality voice in structured reading task, with improved vocal quality in conversation.   Speech Therapy Frequency 2x / week   Duration Other (comment)   Treatment/Interventions Other (comment)   Potential to Achieve Goals Good   Potential Considerations Ability to learn/carryover information;Cooperation/participation level;Previous level of function;Family/community support;Other (comment)   SLP Home Exercise Plan neck and tongue stretches, breath support exercises, oral resonance, vocal loudness, constant flow phonation   Consulted and Agree with Plan of Care Patient        Problem List Patient Active Problem List   Diagnosis Date Noted  . TIA (transient ischemic attack) 10/08/2015   Leroy Sea, MS/CCC- SLP  [name] 11/17/2015, 2:21 PM  [place] MAIN [facility] SERVICES [address] [place], [place], [address] Phone: [phone]   Fax:  [phone]   Name: Mike Decker MRN: [id number] Date of Birth: 09/18/1941

## 2015-11-22 ENCOUNTER — Ambulatory Visit: Payer: Medicare Other | Admitting: Speech Pathology

## 2015-11-22 ENCOUNTER — Ambulatory Visit: Payer: Medicare Other | Admitting: Physical Therapy

## 2015-11-22 ENCOUNTER — Encounter: Payer: Self-pay | Admitting: Physical Therapy

## 2015-11-22 DIAGNOSIS — R471 Dysarthria and anarthria: Secondary | ICD-10-CM

## 2015-11-22 DIAGNOSIS — R531 Weakness: Secondary | ICD-10-CM

## 2015-11-22 DIAGNOSIS — R2681 Unsteadiness on feet: Secondary | ICD-10-CM | POA: Diagnosis not present

## 2015-11-22 NOTE — Therapy (Signed)
[place] MAIN [facility] SERVICES [address] [place], [place], [address] Phone: [phone]   Fax:  [phone]  Physical Therapy Treatment  Patient Details  Name: Mike Decker MRN: [id number] Date of Birth: August 06, 1941 Referring Provider: Melrose Nakayama  Encounter Date: 11/22/2015      PT End of Session - 11/22/15 1410    Visit Number 3   Number of Visits 25   Date for PT Re-Evaluation 02/12/2016   Authorization Type g codes   PT Start Time 0230   PT Stop Time 0315   PT Time Calculation (min) 45 min      Past Medical History  Diagnosis Date  . Arthritis   . Colon polyp   . Diabetes mellitus without complication ([place])   . Hyperlipidemia   . Myocardial infarct ([place])   . Heart disease   . Hypertension   . Atrioventricular canal (AVC)     irregular heart beats  . Diverticulosis   . Barrett esophagus   . Sleep apnea   . Gout   . Vitreoretinal degeneration   . Ocular hypertension   . Hemangioma     liver  . Skin cancer   . Cancer [facility]) 2002    prostate  . Peripheral vascular disease [facility])     Past Surgical History  Procedure Laterality Date  . Prostate surgery  2002  . Coronary angioplasty with stent placement  2012  . Intraocular lens insertion    . Hernia repair      x2  . Cataract extraction  2011, 2012  . Rotator cuff repair Right   . Nose surgery      submucous resection    There were no vitals filed for this visit.  Visit Diagnosis:  Unsteady gait  Weakness      Subjective Assessment - 11/22/15 1410    Subjective Patient has weakness and unsteady gait . He was a bit sore after his last treatment.   Currently in Pain? No/denies    Therapeutic exercise; Leg press x 20 x 3 120 lbs, heel raises with 90 lbs x 20 x 3 Heel raises standing  4 way hip RTB x 20  standing hip abd with YTB x 20  side stepping on TM x 5  Minutes each way standing on blue foam with cone reaching x 20 across midline step ups from  floor to 6 inch stool x 20 bilateral  Min cueing needed to appropriately perform tasks with leg, hand, and head position.. Patient responds well to verbal and tactile cues to correct form and technique.  CGA to SBA for safety with activities.  Uses to increase intensity  of movements throughout session                            PT Education - 11/22/15 1410    Education provided Yes   Education Details HEP   Person(s) Educated Patient   Comprehension Verbalized understanding             PT Long Term Goals - 11/15/15 1355    PT LONG TERM GOAL #1   Title Patient will be independent in home exercise program to improve strength/mobility for better functional independence with ADLs   Time 12   Period Weeks   Status New   PT LONG TERM GOAL #2   Title Patient will tolerate 5 seconds of single leg stance without loss of balance to improve ability  to get in and out of shower safely   Time 12   Period Weeks   Status New   PT LONG TERM GOAL #3   Title Patient will reduce timed up and go to <11 seconds to reduce fall risk and demonstrate improved transfer/gait ability   Time 12   Status New   PT LONG TERM GOAL #4   Title Patient will increase 10 meter walk test to >1.77m/s as to improve gait speed for better community ambulation and to reduce fall risk   Time 12   Status New               Plan - 11/22/15 1438    Clinical Impression Statement Patients performance improves with practice and she uses UE to help support and for balance.   Pt will benefit from skilled therapeutic intervention in order to improve on the following deficits Abnormal gait;Decreased mobility;Impaired sensation;Decreased activity tolerance;Decreased coordination;Decreased strength;Pain   Rehab Potential Good   PT Frequency 2x / week   PT Duration 12 weeks   PT Treatment/Interventions Electrical Stimulation;Moist Heat;Ultrasound;Stair training;Gait training;Neuromuscular  re-education;Balance training;Therapeutic exercise;Therapeutic activities   PT Next Visit Plan Strengtening LE's, balance traiing   PT Home Exercise Plan HEP   Consulted and Agree with Plan of Care Patient        Problem List Patient Active Problem List   Diagnosis Date Noted  . TIA (transient ischemic attack) 10/08/2015    Alanson Puls 11/22/2015, 2:41 PM  [place] MAIN [facility] SERVICES [place], [place], [address] Phone: [phone]   Fax:  [phone]  Name: Mike Decker MRN: [id number] Date of Birth: July 31, 1941

## 2015-11-23 ENCOUNTER — Encounter: Payer: Self-pay | Admitting: Speech Pathology

## 2015-11-23 NOTE — Therapy (Signed)
[place] MAIN [facility] SERVICES [address] [place], [place], [address] Phone: [phone]   Fax:  [phone]  Speech Language Pathology Treatment/Discharge Summary  Patient Details  Name: CHAKA JEFFERYS MRN: [id number] Date of Birth: 01-05-1941 Referring Provider: Gurney Maxin, MD  Encounter Date: 11/22/2015      End of Session - 11/23/15 1030    Visit Number 6   Number of Visits 17   Date for SLP Re-Evaluation 01/01/16   SLP Start Time 1330   SLP Stop Time  8416   SLP Time Calculation (min) 47 min   Activity Tolerance Patient tolerated treatment well      Past Medical History  Diagnosis Date  . Arthritis   . Colon polyp   . Diabetes mellitus without complication ([place])   . Hyperlipidemia   . Myocardial infarct ([place])   . Heart disease   . Hypertension   . Atrioventricular canal (AVC)     irregular heart beats  . Diverticulosis   . Barrett esophagus   . Sleep apnea   . Gout   . Vitreoretinal degeneration   . Ocular hypertension   . Hemangioma     liver  . Skin cancer   . Cancer [facility]) 2002    prostate  . Peripheral vascular disease [facility])     Past Surgical History  Procedure Laterality Date  . Prostate surgery  2002  . Coronary angioplasty with stent placement  2012  . Intraocular lens insertion    . Hernia repair      x2  . Cataract extraction  2011, 2012  . Rotator cuff repair Right   . Nose surgery      submucous resection    There were no vitals filed for this visit.  Visit Diagnosis: Dysarthria      Subjective Assessment - 11/23/15 1029    Subjective "I feel ready to discharge from speech therapy; you've helped me a lot"   Currently in Pain? No/denies               ADULT SLP TREATMENT - 11/23/15 0001    General Information   Behavior/Cognition Alert;Cooperative;Pleasant mood   Treatment Provided   Treatment provided Cognitive-Linquistic   Pain Assessment   Pain Assessment No/denies  pain   Cognitive-Linquistic Treatment   Treatment focused on Dysarthria   Skilled Treatment The patient was provided with written and verbal teaching regarding neck, tongue, and throat stretches to promote relaxed phonation.  The patient is performing well.  The patient was provided with written and verbal teaching regarding breath support exercises.  The patient demonstrates use of shoulders when attempting to breathe deeply.  Patient instructed in relaxed phonation / oral resonance.  Patient demonstrates improve vocal quality with additional instruction and model with constant flow phonation in combination with vocal loudness.  He has acquired a habit of shallow breaths and constricting air flow through the glottis.  Read aloud paragraphs with loud, good quality voice with 90% accuracy. Maintain loud, good quality voice in conversation with 85% accuracy.   Assessment / Recommendations / Plan   Plan Discharge SLP treatment due to (comment);All goals met   Progression Toward Goals   Progression toward goals Goals met, education completed, patient discharged from [place] [facility] - 11/23/15 1029    Education provided Yes   Education Details neck and tongue stretches, breath support exercises, oral resonance, vocal loudness, constant flow phonation  Person(s) Educated Patient   Methods Explanation   Comprehension Verbalized understanding;Returned demonstration            SLP Long Term Goals - 11/29/2015 1031    SLP LONG TERM GOAL #1   Title The patient will demonstrate independent understanding of vocal hygiene concepts and neck, shoulder, lingual stretching exercises.   Time 8   Period Weeks   Status Achieved   SLP LONG TERM GOAL #2   Title The patient will be independent for abdominal breathing and breath support exercises.   Time 8   Period Weeks   Status Achieved   SLP LONG TERM GOAL #3   Title The patient will maximize voice quality and loudness using breath support  for sustained vowel production, pitch glides, and hierarchal speech drill.   Time 8   Period Weeks   Status Achieved   SLP LONG TERM GOAL #4   Title The patient will maximize voice quality and loudness using breath support for paragraph length recitation with 80% accuracy.   Time 8   Period Weeks   Status Achieved          Plan - Nov 29, 2015 1030    Clinical Impression Statement The patient has met his goals; he able to improve vocal quality with good breath support in combination with vocal loudness.  Patient demonstrates loud, good quality voice in structured reading task and in conversation.  He is ready for discharge from speech therapy.   Speech Therapy Frequency 2x / week   Duration Other (comment)   Treatment/Interventions Other (comment)  Voice therapy   Potential to Achieve Goals Good   Potential Considerations Ability to learn/carryover information;Cooperation/participation level;Previous level of function;Family/community support;Other (comment)   SLP Home Exercise Plan neck and tongue stretches, breath support exercises, oral resonance, vocal loudness, constant flow phonation   Consulted and Agree with Plan of Care Patient          G-Codes - 11/29/2015 1032    Functional Assessment Tool Used Therapeutic activities, clinical judgment   Functional Limitations Voice   Voice Current Status (G9171) At least 1 percent but less than 20 percent impaired, limited or restricted   Voice Goal Status (T2458) At least 20 percent but less than 40 percent impaired, limited or restricted   Voice Discharge Status (K9983) At least 1 percent but less than 20 percent impaired, limited or restricted      Problem List Patient Active Problem List   Diagnosis Date Noted  . TIA (transient ischemic attack) 10/08/2015   Leroy Sea, MS/CCC- SLP  [name] 11/29/2015, 10:33 AM  [place] MAIN [facility] SERVICES [place],  [place], [address] Phone: [phone]   Fax:  [phone]   Name: KENDYN ZAMAN MRN: [id number] Date of Birth: 1941-11-22

## 2015-11-24 ENCOUNTER — Encounter: Payer: Medicare Other | Admitting: Speech Pathology

## 2015-11-24 ENCOUNTER — Ambulatory Visit: Payer: Medicare Other | Admitting: Physical Therapy

## 2015-11-24 ENCOUNTER — Encounter: Payer: Self-pay | Admitting: Physical Therapy

## 2015-11-24 DIAGNOSIS — R471 Dysarthria and anarthria: Secondary | ICD-10-CM | POA: Diagnosis not present

## 2015-11-24 DIAGNOSIS — E1165 Type 2 diabetes mellitus with hyperglycemia: Secondary | ICD-10-CM | POA: Diagnosis not present

## 2015-11-24 DIAGNOSIS — I10 Essential (primary) hypertension: Secondary | ICD-10-CM | POA: Diagnosis not present

## 2015-11-24 DIAGNOSIS — R2681 Unsteadiness on feet: Secondary | ICD-10-CM | POA: Diagnosis not present

## 2015-11-24 DIAGNOSIS — Z794 Long term (current) use of insulin: Secondary | ICD-10-CM | POA: Diagnosis not present

## 2015-11-24 DIAGNOSIS — N183 Chronic kidney disease, stage 3 (moderate): Secondary | ICD-10-CM | POA: Diagnosis not present

## 2015-11-24 DIAGNOSIS — R531 Weakness: Secondary | ICD-10-CM | POA: Diagnosis not present

## 2015-11-24 DIAGNOSIS — E1122 Type 2 diabetes mellitus with diabetic chronic kidney disease: Secondary | ICD-10-CM | POA: Diagnosis not present

## 2015-11-24 DIAGNOSIS — K219 Gastro-esophageal reflux disease without esophagitis: Secondary | ICD-10-CM | POA: Diagnosis not present

## 2015-11-24 DIAGNOSIS — E784 Other hyperlipidemia: Secondary | ICD-10-CM | POA: Diagnosis not present

## 2015-11-24 DIAGNOSIS — K5732 Diverticulitis of large intestine without perforation or abscess without bleeding: Secondary | ICD-10-CM | POA: Diagnosis not present

## 2015-11-24 NOTE — Therapy (Signed)
[place] MAIN [facility] SERVICES [address] [place], [place], [address] Phone: [phone]   Fax:  [phone]  Physical Therapy Treatment  Patient Details  Name: TAIVION ALVELO MRN: [id number] Date of Birth: [birth year] Referring Provider: Melrose Nakayama  Encounter Date: 11/24/2015      PT End of Session - 11/24/15 1450    Visit Number 4   Number of Visits 25   Date for PT Re-Evaluation 2016-02-08   Authorization Type g codes   PT Start Time 0300   PT Stop Time 0340   PT Time Calculation (min) 40 min      Past Medical History  Diagnosis Date  . Arthritis   . Colon polyp   . Diabetes mellitus without complication ([place])   . Hyperlipidemia   . Myocardial infarct ([place])   . Heart disease   . Hypertension   . Atrioventricular canal (AVC)     irregular heart beats  . Diverticulosis   . Barrett esophagus   . Sleep apnea   . Gout   . Vitreoretinal degeneration   . Ocular hypertension   . Hemangioma     liver  . Skin cancer   . Cancer [facility]) 2002    prostate  . Peripheral vascular disease [facility])     Past Surgical History  Procedure Laterality Date  . Prostate surgery  2002  . Coronary angioplasty with stent placement  2012  . Intraocular lens insertion    . Hernia repair      x2  . Cataract extraction  2011, 2012  . Rotator cuff repair Right   . Nose surgery      submucous resection    There were no vitals filed for this visit.  Visit Diagnosis:  Unsteady gait  Weakness      Subjective Assessment - 11/24/15 1449    Subjective Patient has weakness and unsteady gait . He was a bit sore after his last treatment.   Currently in Pain? No/denies      standing hip abd with YTB  X 20 with pain complaints in RLE with abduction  side stepping left and right in parallel bars with YTB  10 feet x 3 standing on blue foam with cone reaching x 20 across midline step ups from floor to 6 inch stool x 20 bilateral Leg press x 20 x 3  sets Matrix fwd/bwd/side stepping x 5 each way Side stepping on TM . 5 miles /hour left and right Manual therapy; Long axis RLE 30 bouts x 6 with reports of no pain following manual therpay Patient needs occasional verbal cueing to improve posture and cueing to correctly perform exercises slowly, holding at end of range to increase motor firing of desired muscle to encourage fatigue.                            PT Education - 11/24/15 1449    Education provided Yes   Education Details HEP   Person(s) Educated Patient   Methods Explanation   Comprehension Verbalized understanding             PT Long Term Goals - 11/15/15 1355    PT LONG TERM GOAL #1   Title Patient will be independent in home exercise program to improve strength/mobility for better functional independence with ADLs   Time 12   Period Weeks   Status New   PT LONG TERM GOAL #2   Title  Patient will tolerate 5 seconds of single leg stance without loss of balance to improve ability to get in and out of shower safely   Time 12   Period Weeks   Status New   PT LONG TERM GOAL #3   Title Patient will reduce timed up and go to <11 seconds to reduce fall risk and demonstrate improved transfer/gait ability   Time 12   Status New   PT LONG TERM GOAL #4   Title Patient will increase 10 meter walk test to >1.24m/s as to improve gait speed for better community ambulation and to reduce fall risk   Time 12   Status New               Plan - 11/24/15 1450    Clinical Impression Statement Continues to have balance deficits typical with diagnosis   Pt will benefit from skilled therapeutic intervention in order to improve on the following deficits Abnormal gait;Decreased mobility;Impaired sensation;Decreased activity tolerance;Decreased coordination;Decreased strength;Pain   Rehab Potential Good   PT Frequency 2x / week   PT Duration 12 weeks   PT Treatment/Interventions Electrical  Stimulation;Moist Heat;Ultrasound;Stair training;Gait training;Neuromuscular re-education;Balance training;Therapeutic exercise;Therapeutic activities   PT Next Visit Plan Strengtening LE's, balance traiing   PT Home Exercise Plan HEP   Consulted and Agree with Plan of Care Patient        Problem List Patient Active Problem List   Diagnosis Date Noted  . TIA (transient ischemic attack) 10/08/2015    Alanson Puls 11/24/2015, 3:15 PM  [place] MAIN [facility] SERVICES [place], [place], [address] Phone: [phone]   Fax:  [phone]  Name: RYATT CARODINE MRN: [id number] Date of Birth: [birth year]

## 2015-11-29 ENCOUNTER — Encounter: Payer: Medicare Other | Admitting: Speech Pathology

## 2015-11-29 ENCOUNTER — Ambulatory Visit: Payer: Medicare Other | Admitting: Physical Therapy

## 2015-11-29 DIAGNOSIS — R2681 Unsteadiness on feet: Secondary | ICD-10-CM

## 2015-11-29 DIAGNOSIS — R531 Weakness: Secondary | ICD-10-CM | POA: Diagnosis not present

## 2015-11-29 DIAGNOSIS — R471 Dysarthria and anarthria: Secondary | ICD-10-CM | POA: Diagnosis not present

## 2015-11-29 NOTE — Therapy (Signed)
[place] MAIN [facility] SERVICES [address] [place], [place], [address] Phone: [phone]   Fax:  [phone]  Physical Therapy Treatment  Patient Details  Name: Mike Decker MRN: [id number] Date of Birth: January 20, 1941 Referring Provider: Melrose Nakayama  Encounter Date: 11/29/2015    Past Medical History  Diagnosis Date  . Arthritis   . Colon polyp   . Diabetes mellitus without complication ([place])   . Hyperlipidemia   . Myocardial infarct ([place])   . Heart disease   . Hypertension   . Atrioventricular canal (AVC)     irregular heart beats  . Diverticulosis   . Barrett esophagus   . Sleep apnea   . Gout   . Vitreoretinal degeneration   . Ocular hypertension   . Hemangioma     liver  . Skin cancer   . Cancer [facility]) 2002    prostate  . Peripheral vascular disease [facility])     Past Surgical History  Procedure Laterality Date  . Prostate surgery  2002  . Coronary angioplasty with stent placement  2012  . Intraocular lens insertion    . Hernia repair      x2  . Cataract extraction  2011, 2012  . Rotator cuff repair Right   . Nose surgery      submucous resection    There were no vitals filed for this visit.  Visit Diagnosis:  Unsteady gait  Weakness  Therapetuic exericse;     standing hip abd , ext, flex with YTB x 20  side stepping left and right in parallel bars 10 feet x 3 Standing and tapping stool x 20 BLE step ups from floor to 6 inch stool x 20 bilateral sit to stand x 10 x 2  Matrix fwd/bwd/ side stepping left and right x 5  Min cueing needed to appropriately perform balance  tasks with leg, hand, and head position. Decreased coordination demonstrated with side stepping left and right.  . Patient responds well to verbal and tactile cues to correct form and technique.  CGA to SBA for safety with activities.  Uses to increase intensity  of movements throughout session.                                PT Long Term Goals - 11/15/15 1355    PT LONG TERM GOAL #1   Title Patient will be independent in home exercise program to improve strength/mobility for better functional independence with ADLs   Time 12   Period Weeks   Status New   PT LONG TERM GOAL #2   Title Patient will tolerate 5 seconds of single leg stance without loss of balance to improve ability to get in and out of shower safely   Time 12   Period Weeks   Status New   PT LONG TERM GOAL #3   Title Patient will reduce timed up and go to <11 seconds to reduce fall risk and demonstrate improved transfer/gait ability   Time 12   Status New   PT LONG TERM GOAL #4   Title Patient will increase 10 meter walk test to >1.36m/s as to improve gait speed for better community ambulation and to reduce fall risk   Time 12   Status New               Plan - 11/29/15 1420    Clinical Impression Statement Muscle fatigue but  no major pain complaints. Patient advancing to red theraband for exercises listed above.   Pt will benefit from skilled therapeutic intervention in order to improve on the following deficits Abnormal gait;Decreased mobility;Impaired sensation;Decreased activity tolerance;Decreased coordination;Decreased strength;Pain   Rehab Potential Good   PT Frequency 2x / week   PT Duration 12 weeks   PT Treatment/Interventions Electrical Stimulation;Moist Heat;Ultrasound;Stair training;Gait training;Neuromuscular re-education;Balance training;Therapeutic exercise;Therapeutic activities   PT Next Visit Plan Strengtening LE's, balance traiing   PT Home Exercise Plan HEP   Consulted and Agree with Plan of Care Patient        Problem List Patient Active Problem List   Diagnosis Date Noted  . TIA (transient ischemic attack) 10/08/2015    Alanson Puls 11/29/2015, 2:23 PM  [place] MAIN [facility] SERVICES [place], [place], [address] Phone: [phone]    Fax:  [phone]  Name: Mike Decker MRN: [id number] Date of Birth: 1941/11/25

## 2015-11-30 DIAGNOSIS — E1142 Type 2 diabetes mellitus with diabetic polyneuropathy: Secondary | ICD-10-CM | POA: Diagnosis not present

## 2015-11-30 DIAGNOSIS — E1151 Type 2 diabetes mellitus with diabetic peripheral angiopathy without gangrene: Secondary | ICD-10-CM | POA: Diagnosis not present

## 2015-11-30 DIAGNOSIS — I10 Essential (primary) hypertension: Secondary | ICD-10-CM | POA: Diagnosis not present

## 2015-11-30 DIAGNOSIS — N183 Chronic kidney disease, stage 3 (moderate): Secondary | ICD-10-CM | POA: Diagnosis not present

## 2015-11-30 DIAGNOSIS — E1165 Type 2 diabetes mellitus with hyperglycemia: Secondary | ICD-10-CM | POA: Diagnosis not present

## 2015-11-30 DIAGNOSIS — E1122 Type 2 diabetes mellitus with diabetic chronic kidney disease: Secondary | ICD-10-CM | POA: Diagnosis not present

## 2015-11-30 DIAGNOSIS — Z794 Long term (current) use of insulin: Secondary | ICD-10-CM | POA: Diagnosis not present

## 2015-12-01 ENCOUNTER — Ambulatory Visit: Payer: Medicare Other | Admitting: Physical Therapy

## 2015-12-01 ENCOUNTER — Encounter: Payer: Self-pay | Admitting: Physical Therapy

## 2015-12-01 ENCOUNTER — Encounter: Payer: Medicare Other | Admitting: Speech Pathology

## 2015-12-01 DIAGNOSIS — R2681 Unsteadiness on feet: Secondary | ICD-10-CM | POA: Diagnosis not present

## 2015-12-01 DIAGNOSIS — R471 Dysarthria and anarthria: Secondary | ICD-10-CM | POA: Diagnosis not present

## 2015-12-01 DIAGNOSIS — R531 Weakness: Secondary | ICD-10-CM | POA: Diagnosis not present

## 2015-12-01 NOTE — Therapy (Signed)
[place] MAIN [facility] SERVICES [address] [place], [place], [address] Phone: [phone]   Fax:  [phone]  Physical Therapy Treatment  Patient Details  Name: Mike Decker MRN: [id number] Date of Birth: [birth year] Referring Provider: Melrose Nakayama  Encounter Date: 12/01/2015      PT End of Session - 12/01/15 1415    Visit Number 5   Number of Visits 25   Date for PT Re-Evaluation 2016-02-25   Authorization Type g codes   PT Start Time 0200   PT Stop Time 0245   PT Time Calculation (min) 45 min      Past Medical History  Diagnosis Date  . Arthritis   . Colon polyp   . Diabetes mellitus without complication ([place])   . Hyperlipidemia   . Myocardial infarct ([place])   . Heart disease   . Hypertension   . Atrioventricular canal (AVC)     irregular heart beats  . Diverticulosis   . Barrett esophagus   . Sleep apnea   . Gout   . Vitreoretinal degeneration   . Ocular hypertension   . Hemangioma     liver  . Skin cancer   . Cancer [facility]) 2002    prostate  . Peripheral vascular disease [facility])     Past Surgical History  Procedure Laterality Date  . Prostate surgery  2002  . Coronary angioplasty with stent placement  2012  . Intraocular lens insertion    . Hernia repair      x2  . Cataract extraction  2011, 2012  . Rotator cuff repair Right   . Nose surgery      submucous resection    There were no vitals filed for this visit.  Visit Diagnosis:  Unsteady gait  Weakness      Subjective Assessment - 12/01/15 1415    Subjective Patient has weakness and unsteady gait .He is doing his HEP.      Neuromuscular Re-education    Step ups to blue foam pad x 10 bilaterally for 2 sets bilaterally   Heel raises from 2 feet transitioning to TTWB on one foot. X 12 bilaterally for 1 set Sit to stand training x5 repetitions for 3 sets Matrix  x 5 reps fwd/bwd/side stepping left and right x 5   Therapeutic exercise LE:    standing  hip abd with YTB x 20  side stepping left and right  TM  sit to stand x 10 Patient needs occasional verbal cueing to improve posture and cueing to correctly perform exercises slowly.  Patient has unsteady stepping with several exercises but his motor control improve with practice                        PT Education - 12/01/15 1415    Education provided Yes   Education Details HEP   Person(s) Educated Patient   Methods Explanation   Comprehension Verbalized understanding             PT Long Term Goals - 11/15/15 1355    PT LONG TERM GOAL #1   Title Patient will be independent in home exercise program to improve strength/mobility for better functional independence with ADLs   Time 12   Period Weeks   Status New   PT LONG TERM GOAL #2   Title Patient will tolerate 5 seconds of single leg stance without loss of balance to improve ability to get in and out of shower  safely   Time 12   Period Weeks   Status New   PT LONG TERM GOAL #3   Title Patient will reduce timed up and go to <11 seconds to reduce fall risk and demonstrate improved transfer/gait ability   Time 12   Status New   PT LONG TERM GOAL #4   Title Patient will increase 10 meter walk test to >1.50m/s as to improve gait speed for better community ambulation and to reduce fall risk   Time 12   Status New               Plan - 12/01/15 1416    Clinical Impression Statement Muscle fatigue but no major pain complaints. Patient advancing to red theraband for exercises listed above.   Pt will benefit from skilled therapeutic intervention in order to improve on the following deficits Abnormal gait;Decreased mobility;Impaired sensation;Decreased activity tolerance;Decreased coordination;Decreased strength;Pain   Rehab Potential Good   PT Frequency 2x / week   PT Duration 12 weeks   PT Treatment/Interventions Electrical Stimulation;Moist Heat;Ultrasound;Stair training;Gait training;Neuromuscular  re-education;Balance training;Therapeutic exercise;Therapeutic activities   PT Next Visit Plan Strengtening LE's, balance traiing   PT Home Exercise Plan HEP   Consulted and Agree with Plan of Care Patient        Problem List Patient Active Problem List   Diagnosis Date Noted  . TIA (transient ischemic attack) 10/08/2015    Alanson Puls 12/01/2015, 2:23 PM  [place] MAIN [facility] SERVICES [address] [place], [place], [address] Phone: [phone]   Fax:  [phone]  Name: Mike Decker MRN: [id number] Date of Birth: Dec 13, 1940

## 2015-12-06 ENCOUNTER — Encounter: Payer: Medicare Other | Admitting: Speech Pathology

## 2015-12-06 ENCOUNTER — Ambulatory Visit: Payer: Medicare Other | Admitting: Physical Therapy

## 2015-12-08 ENCOUNTER — Ambulatory Visit: Payer: Medicare Other | Admitting: Physical Therapy

## 2015-12-08 ENCOUNTER — Encounter: Payer: Medicare Other | Admitting: Speech Pathology

## 2015-12-13 ENCOUNTER — Ambulatory Visit: Payer: Medicare Other | Attending: Neurology | Admitting: Physical Therapy

## 2015-12-13 ENCOUNTER — Encounter: Payer: Self-pay | Admitting: Physical Therapy

## 2015-12-13 ENCOUNTER — Encounter: Payer: Medicare Other | Admitting: Speech Pathology

## 2015-12-13 DIAGNOSIS — R2681 Unsteadiness on feet: Secondary | ICD-10-CM | POA: Diagnosis not present

## 2015-12-13 DIAGNOSIS — R531 Weakness: Secondary | ICD-10-CM | POA: Diagnosis not present

## 2015-12-13 NOTE — Therapy (Signed)
[place] MAIN [facility] SERVICES [address] [place], [place], [address] Phone: [phone]   Fax:  [phone]  Physical Therapy Treatment  Patient Details  Name: NICHOLA WARREN MRN: [id number] Date of Birth: 01-Aug-1941 Referring Provider: Melrose Nakayama  Encounter Date: 12/13/2015      PT End of Session - 12/13/15 1415    Visit Number 6   Number of Visits 25   Date for PT Re-Evaluation 02-11-2016   Authorization Type g codes   PT Start Time 0200   PT Stop Time 0240   PT Time Calculation (min) 40 min   Equipment Utilized During Treatment Gait belt   Activity Tolerance Patient limited by pain   Behavior During Therapy [facility] for tasks assessed/performed      Past Medical History  Diagnosis Date  . Arthritis   . Colon polyp   . Diabetes mellitus without complication ([place])   . Hyperlipidemia   . Myocardial infarct ([place])   . Heart disease   . Hypertension   . Atrioventricular canal (AVC)     irregular heart beats  . Diverticulosis   . Barrett esophagus   . Sleep apnea   . Gout   . Vitreoretinal degeneration   . Ocular hypertension   . Hemangioma     liver  . Skin cancer   . Cancer [facility]) 2002    prostate  . Peripheral vascular disease [facility])     Past Surgical History  Procedure Laterality Date  . Prostate surgery  2002  . Coronary angioplasty with stent placement  2012  . Intraocular lens insertion    . Hernia repair      x2  . Cataract extraction  2011, 2012  . Rotator cuff repair Right   . Nose surgery      submucous resection    There were no vitals filed for this visit.  Visit Diagnosis:  Unsteady gait  Weakness      Subjective Assessment - 12/13/15 1413    Subjective Patient has pain today in his back that is 4/10 in right SI .    Currently in Pain? Yes   Pain Score 4    Pain Location Back   Pain Orientation Right      Therapeutic exercise; Prone extension x 10 , prone extension with RLE flex x 10 Leg press  x 20 x 3 120 lbs, heel raises with 90 lbs x 20 x 3 Heel raises standing  4 way hip RTB x 20  standing hip abd with YTB x 20  side stepping on TM x 5 Minutes each way standing on blue foam with cone reaching x 20 across midline step ups from floor to 6 inch stool x 20 bilateral  Min cueing needed to appropriately perform tasks with leg, hand, and head position.. Patient responds well to verbal and tactile cues to correct form and technique. CGA to SBA for safety with activities. Uses to increase intensity of movements throughout session                                     PT Education - 12/13/15 1414    Education provided Yes   Education Details prone extension with RLE flexed   Person(s) Educated Patient   Methods Explanation   Comprehension Verbalized understanding             PT Long Term Goals - 12/13/15  [place] #1   Status Partially Met   PT LONG TERM GOAL #2   Status Partially Met   PT LONG TERM GOAL #3   Status Partially Met   PT LONG TERM GOAL #4   Status Partially Met               Plan - 12/13/15 1415    Clinical Impression Statement PT provided min - moderate verbal instruction to improve set up, proper use of LE, and improved posture and gait after prone extension exercises.. Patient responded moderately to instruction   Pt will benefit from skilled therapeutic intervention in order to improve on the following deficits Abnormal gait;Decreased mobility;Impaired sensation;Decreased activity tolerance;Decreased coordination;Decreased strength;Pain   Rehab Potential Good   PT Frequency 2x / week   PT Duration 12 weeks   PT Treatment/Interventions Electrical Stimulation;Moist Heat;Ultrasound;Stair training;Gait training;Neuromuscular re-education;Balance training;Therapeutic exercise;Therapeutic activities   PT Next Visit Plan Strengtening LE's, balance traiing   PT Home Exercise Plan HEP   Consulted and  Agree with Plan of Care Patient        Problem List Patient Active Problem List   Diagnosis Date Noted  . TIA (transient ischemic attack) 10/08/2015    Alanson Puls 12/13/2015, 2:19 PM  [place] MAIN [facility] SERVICES [place], [place], [address] Phone: [phone]   Fax:  [phone]  Name: CORTLAN DOLIN MRN: [id number] Date of Birth: 1941-09-12

## 2015-12-15 ENCOUNTER — Encounter: Payer: Self-pay | Admitting: Physical Therapy

## 2015-12-15 ENCOUNTER — Encounter: Payer: Medicare Other | Admitting: Speech Pathology

## 2015-12-15 ENCOUNTER — Ambulatory Visit: Payer: Medicare Other | Attending: Neurology | Admitting: Physical Therapy

## 2015-12-15 DIAGNOSIS — R531 Weakness: Secondary | ICD-10-CM | POA: Insufficient documentation

## 2015-12-15 DIAGNOSIS — R2681 Unsteadiness on feet: Secondary | ICD-10-CM | POA: Insufficient documentation

## 2015-12-15 DIAGNOSIS — H60391 Other infective otitis externa, right ear: Secondary | ICD-10-CM | POA: Diagnosis not present

## 2015-12-15 DIAGNOSIS — H6121 Impacted cerumen, right ear: Secondary | ICD-10-CM | POA: Diagnosis not present

## 2015-12-15 NOTE — Therapy (Signed)
[place] MAIN [facility] SERVICES [address] [place], [place], [address] Phone: [phone]   Fax:  [phone]  Physical Therapy Treatment  Patient Details  Name: JAVARIAN JAKUBIAK MRN: [id number] Date of Birth: 1941/05/19 Referring Provider: Melrose Nakayama  Encounter Date: 12/15/2015      PT End of Session - 12/15/15 1411    Visit Number 7   Number of Visits 25   Date for PT Re-Evaluation 02-20-2016   Authorization Type g codes   PT Start Time 0200   PT Stop Time 0240   PT Time Calculation (min) 40 min   Equipment Utilized During Treatment Gait belt   Activity Tolerance Patient limited by pain   Behavior During Therapy [facility] for tasks assessed/performed      Past Medical History  Diagnosis Date  . Arthritis   . Colon polyp   . Diabetes mellitus without complication ([place])   . Hyperlipidemia   . Myocardial infarct ([place])   . Heart disease   . Hypertension   . Atrioventricular canal (AVC)     irregular heart beats  . Diverticulosis   . Barrett esophagus   . Sleep apnea   . Gout   . Vitreoretinal degeneration   . Ocular hypertension   . Hemangioma     liver  . Skin cancer   . Cancer [facility]) 2002    prostate  . Peripheral vascular disease [facility])     Past Surgical History  Procedure Laterality Date  . Prostate surgery  2002  . Coronary angioplasty with stent placement  2012  . Intraocular lens insertion    . Hernia repair      x2  . Cataract extraction  2011, 2012  . Rotator cuff repair Right   . Nose surgery      submucous resection    There were no vitals filed for this visit.  Visit Diagnosis:  Unsteady gait  Weakness      Subjective Assessment - 12/15/15 1410    Subjective Patient has no pain today, the prone extension exercises helped.          There ex: Leg press with 90#x 10, followed by 100# x10 and then 120# x10  sit to stand 2x10   Mini squats in //bars x 20 Side stepping on TM . 6 m/ sec  Side stepping on  blue foam beam x 5 4 way hip with YTB x 15 Eccentric step downs x 10 x 2 Hip abd side stepping with RTB x 20 1/2 foam flat side up with ball toss Patient needs occasional verbal cueing to improve posture and cueing to correctly perform exercises slowly, holding at end of range to increase motor firing of desired muscle to encourage fatigue.                             PT Education - 12/15/15 1410    Education provided Yes   Education Details HEP    Person(s) Educated Patient   Methods Explanation   Comprehension Verbalized understanding             PT Long Term Goals - 12/13/15 1419    PT LONG TERM GOAL #1   Status Partially Met   PT LONG TERM GOAL #2   Status Partially Met   PT LONG TERM GOAL #3   Status Partially Met   PT LONG TERM GOAL #4   Status Partially Met  Plan - 12/15/15 1411    Clinical Impression Statement Tactile cues to keep upright posture during hip extension in standing. Patient is able to perform standing exercises with resistance today.    Pt will benefit from skilled therapeutic intervention in order to improve on the following deficits Abnormal gait;Decreased mobility;Impaired sensation;Decreased activity tolerance;Decreased coordination;Decreased strength;Pain   Rehab Potential Good   PT Frequency 2x / week   PT Duration 12 weeks   PT Treatment/Interventions Electrical Stimulation;Moist Heat;Ultrasound;Stair training;Gait training;Neuromuscular re-education;Balance training;Therapeutic exercise;Therapeutic activities   PT Next Visit Plan Strengtening LE's, balance traiing   PT Home Exercise Plan HEP   Consulted and Agree with Plan of Care Patient        Problem List Patient Active Problem List   Diagnosis Date Noted  . TIA (transient ischemic attack) 10/08/2015  Alanson Puls, PT, DPT  Arelia Sneddon S 12/15/2015, 2:14 PM  [place] MAIN [facility]  SERVICES [place], [place], [address] Phone: [phone]   Fax:  [phone]  Name: EMMETTE KATT MRN: [id number] Date of Birth: 14-Nov-1941

## 2015-12-20 ENCOUNTER — Encounter: Payer: Medicare Other | Admitting: Speech Pathology

## 2015-12-20 ENCOUNTER — Ambulatory Visit: Payer: Medicare Other | Admitting: Physical Therapy

## 2015-12-22 ENCOUNTER — Encounter: Payer: Medicare Other | Admitting: Speech Pathology

## 2015-12-22 ENCOUNTER — Encounter: Payer: Self-pay | Admitting: Physical Therapy

## 2015-12-22 ENCOUNTER — Ambulatory Visit: Payer: Medicare Other | Admitting: Physical Therapy

## 2015-12-22 DIAGNOSIS — R531 Weakness: Secondary | ICD-10-CM

## 2015-12-22 DIAGNOSIS — N2581 Secondary hyperparathyroidism of renal origin: Secondary | ICD-10-CM | POA: Diagnosis not present

## 2015-12-22 DIAGNOSIS — N183 Chronic kidney disease, stage 3 (moderate): Secondary | ICD-10-CM | POA: Diagnosis not present

## 2015-12-22 DIAGNOSIS — E1122 Type 2 diabetes mellitus with diabetic chronic kidney disease: Secondary | ICD-10-CM | POA: Diagnosis not present

## 2015-12-22 DIAGNOSIS — R2681 Unsteadiness on feet: Secondary | ICD-10-CM

## 2015-12-22 DIAGNOSIS — I129 Hypertensive chronic kidney disease with stage 1 through stage 4 chronic kidney disease, or unspecified chronic kidney disease: Secondary | ICD-10-CM | POA: Diagnosis not present

## 2015-12-22 NOTE — Therapy (Signed)
[place] MAIN [facility] SERVICES [address] [place], [place], [address] Phone: [phone]   Fax:  [phone]  Physical Therapy Treatment  Patient Details  Name: Mike Decker MRN: [id number] Date of Birth: [birth year] Referring Provider: Melrose Nakayama  Encounter Date: 12/22/2015      PT End of Session - 12/22/15 1400    Visit Number 8   Number of Visits 25   Date for PT Re-Evaluation Mar 01, 2016   Authorization Type g codes   PT Start Time 03-03-54   PT Stop Time 1440   PT Time Calculation (min) 45 min   Equipment Utilized During Treatment Gait belt   Activity Tolerance Patient limited by pain   Behavior During Therapy [facility] for tasks assessed/performed      Past Medical History  Diagnosis Date  . Arthritis   . Colon polyp   . Diabetes mellitus without complication ([place])   . Hyperlipidemia   . Myocardial infarct ([place])   . Heart disease   . Hypertension   . Atrioventricular canal (AVC)     irregular heart beats  . Diverticulosis   . Barrett esophagus   . Sleep apnea   . Gout   . Vitreoretinal degeneration   . Ocular hypertension   . Hemangioma     liver  . Skin cancer   . Cancer [facility]) 03-03-01    prostate  . Peripheral vascular disease [facility])     Past Surgical History  Procedure Laterality Date  . Prostate surgery  Mar 03, 2001  . Coronary angioplasty with stent placement  04-Mar-2011  . Intraocular lens insertion    . Hernia repair      x2  . Cataract extraction  2011, 2012  . Rotator cuff repair Right   . Nose surgery      submucous resection    There were no vitals filed for this visit.  Visit Diagnosis:  Unsteady gait  Weakness      Subjective Assessment - 12/22/15 1400    Subjective Patient has no pain today, the prone extension exercises helped.  He is happy that he is better.    Currently in Pain? No/denies        THER-EX Standing exercises with 2# ankle weights: Marching 2 x 10; SLR 2 x 10; Abduction 2 x 10; Extension  2 x 10; Knee flexion 2 x 10; Heel raises 2 x 10;  Resisted side-steeping RTB 4 lengths x 2; Standing mini squats 2 x 10 with RTB around knees to encourage abduction; Sit to stand without UE support 2 x 10; Step-ups to 6" step x 10 bilateral; Quantum leg press 105# x 10, 120# x 10;  NEUROMUSCULAR RE-EDUCATION Airex NBOS eyes open/closed x 30 seconds each; Airex NBOS eyes open horizontal and vertical head turns x 30 seconds; Airex cone taps alternating LE x 60 seconds; Tandem gait in // bars x 4 laps Min cueing needed to appropriately perform tasks with leg, hand, and head position. Decreased coordination demonstrated requiring consistent verbal cueing to correct form. Patient responds well to verbal and tactile cues to correct form and technique.  CGA to SBA for safety with activities.  Uses to increase intensity and amplitude of movements throughout session                          PT Education - 12/22/15 1400    Education provided Yes   Education Details HEP   Person(s) Educated Patient  Methods Explanation   Comprehension Verbalized understanding             PT Long Term Goals - 12/13/15 1419    PT LONG TERM GOAL #1   Status Partially Met   PT LONG TERM GOAL #2   Status Partially Met   PT LONG TERM GOAL #3   Status Partially Met   PT LONG TERM GOAL #4   Status Partially Met               Plan - 12/22/15 1401    Clinical Impression Statement Patient performs intermediate level exercises without pain behaviors and needs verbal cuing for postural alignment and head positioning   Pt will benefit from skilled therapeutic intervention in order to improve on the following deficits Abnormal gait;Decreased mobility;Impaired sensation;Decreased activity tolerance;Decreased coordination;Decreased strength   Rehab Potential Good   PT Frequency 2x / week   PT Duration 12 weeks   PT Treatment/Interventions Electrical Stimulation;Moist  Heat;Ultrasound;Stair training;Gait training;Neuromuscular re-education;Balance training;Therapeutic exercise;Therapeutic activities   PT Next Visit Plan Strengtening LE's, balance traiing   PT Home Exercise Plan HEP   Consulted and Agree with Plan of Care Patient        Problem List Patient Active Problem List   Diagnosis Date Noted  . TIA (transient ischemic attack) 10/08/2015    Alanson Puls 12/22/2015, 2:06 PM  [place] MAIN [facility] SERVICES [place], [place], [address] Phone: [phone]   Fax:  [phone]  Name: Mike Decker MRN: [id number] Date of Birth: 26-Jan-1941

## 2015-12-27 ENCOUNTER — Encounter: Payer: Medicare Other | Admitting: Occupational Therapy

## 2015-12-27 ENCOUNTER — Encounter: Payer: Self-pay | Admitting: Physical Therapy

## 2015-12-27 ENCOUNTER — Ambulatory Visit: Payer: Medicare Other | Admitting: Physical Therapy

## 2015-12-27 DIAGNOSIS — R2681 Unsteadiness on feet: Secondary | ICD-10-CM

## 2015-12-27 DIAGNOSIS — R531 Weakness: Secondary | ICD-10-CM

## 2015-12-27 NOTE — Therapy (Signed)
[place] MAIN [facility] SERVICES [address] [place], [place], [address] Phone: [phone]   Fax:  [phone]  Physical Therapy Treatment  Patient Details  Name: Mike Decker MRN: [id number] Date of Birth: May 27, 1941 Referring Provider: Melrose Nakayama  Encounter Date: 12/27/2015      PT End of Session - 12/27/15 1448    Visit Number 9   Number of Visits 25   Date for PT Re-Evaluation 13-Feb-2016   Authorization Type g codes   PT Start Time 0230   PT Stop Time 0315   PT Time Calculation (min) 45 min   Equipment Utilized During Treatment Gait belt   Activity Tolerance Patient limited by pain   Behavior During Therapy [facility] for tasks assessed/performed      Past Medical History  Diagnosis Date  . Arthritis   . Colon polyp   . Diabetes mellitus without complication ([place])   . Hyperlipidemia   . Myocardial infarct ([place])   . Heart disease   . Hypertension   . Atrioventricular canal (AVC)     irregular heart beats  . Diverticulosis   . Barrett esophagus   . Sleep apnea   . Gout   . Vitreoretinal degeneration   . Ocular hypertension   . Hemangioma     liver  . Skin cancer   . Cancer [facility]) 2002    prostate  . Peripheral vascular disease [facility])     Past Surgical History  Procedure Laterality Date  . Prostate surgery  2002  . Coronary angioplasty with stent placement  2012  . Intraocular lens insertion    . Hernia repair      x2  . Cataract extraction  2011, 2012  . Rotator cuff repair Right   . Nose surgery      submucous resection    There were no vitals filed for this visit.  Visit Diagnosis:  Unsteady gait  Weakness      Subjective Assessment - 12/27/15 1447    Subjective Patient has no pain today.      THER-EX Standing exercises with 2# ankle weights: Marching 2 x 10; SLR 2 x 10; Abduction 2 x 10; Extension 2 x 10; Knee flexion 2 x 10; Heel raises 2 x 10;  Resisted side-steeping RTB 4 lengths x 2; Standing  mini squats 2 x 10 with RTB around knees to encourage abduction; Sit to stand without UE support 2 x 10; Step-ups to 6" step x 10 bilateral; Quantum leg press 105# x 10, 120# x 10;  NEUROMUSCULAR RE-EDUCATION Airex feet together  eyes open/closed x 30 seconds each; Airex feet together eyes open horizontal and vertical head turns x 30 seconds; Airex cone taps alternating LE x 60 seconds; Side stepping on blue foam balance beam Slide balls and reaching with feet together 2 x 4 balance beam walking Tandem gait in // bars x 4 laps    Patient needs occasional verbal cueing to improve posture and cueing to correctly perform exercises slowly, holding at end of range to increase motor firing of desired muscle to encourage fatigue.                          PT Education - 12/27/15 1448    Education provided Yes   Education Details HEP   Person(s) Educated Patient   Methods Explanation   Comprehension Verbalized understanding             PT Long  Term Goals - 12/13/15 1419    PT LONG TERM GOAL #1   Status Partially Met   PT LONG TERM GOAL #2   Status Partially Met   PT LONG TERM GOAL #3   Status Partially Met   PT LONG TERM GOAL #4   Status Partially Met               Plan - 12/27/15 1449    Clinical Impression Statement Patient demonstrating more control with  squat exercise   Pt will benefit from skilled therapeutic intervention in order to improve on the following deficits Abnormal gait;Decreased mobility;Impaired sensation;Decreased activity tolerance;Decreased coordination;Decreased strength   Rehab Potential Good   PT Frequency 2x / week   PT Duration 12 weeks   PT Treatment/Interventions Electrical Stimulation;Moist Heat;Ultrasound;Stair training;Gait training;Neuromuscular re-education;Balance training;Therapeutic exercise;Therapeutic activities   PT Next Visit Plan Strengtening LE's, balance traiing   PT Home Exercise Plan HEP   Consulted  and Agree with Plan of Care Patient        Problem List Patient Active Problem List   Diagnosis Date Noted  . TIA (transient ischemic attack) 10/08/2015    Alanson Puls 12/27/2015, 2:50 PM  [place] MAIN [facility] SERVICES [address] [place], [place], [address] Phone: [phone]   Fax:  [phone]  Name: Mike Decker MRN: [id number] Date of Birth: 04-09-1941    Alanson Puls, PT, DPT

## 2015-12-29 ENCOUNTER — Encounter: Payer: Self-pay | Admitting: Physical Therapy

## 2015-12-29 ENCOUNTER — Ambulatory Visit: Payer: Medicare Other | Admitting: Physical Therapy

## 2015-12-29 DIAGNOSIS — R531 Weakness: Secondary | ICD-10-CM

## 2015-12-29 DIAGNOSIS — R2681 Unsteadiness on feet: Secondary | ICD-10-CM | POA: Diagnosis not present

## 2015-12-29 NOTE — Therapy (Signed)
[place] MAIN [facility] SERVICES [address] [place], [place], [address] Phone: [phone]   Fax:  [phone]  Physical Therapy Treatment/Dc summary  Patient Details  Name: Mike Decker MRN: [id number] Date of Birth: 02-03-1941 Referring Provider: Melrose Nakayama  Encounter Date: 12/29/2015      PT End of Session - 12/29/15 1434    Visit Number 10   Number of Visits 25   Date for PT Re-Evaluation 03-02-16   Authorization Type g codes   PT Start Time 0230   PT Stop Time 0315   PT Time Calculation (min) 45 min   Equipment Utilized During Treatment Gait belt   Activity Tolerance Patient limited by pain   Behavior During Therapy [facility] for tasks assessed/performed      Past Medical History  Diagnosis Date  . Arthritis   . Colon polyp   . Diabetes mellitus without complication ([place])   . Hyperlipidemia   . Myocardial infarct ([place])   . Heart disease   . Hypertension   . Atrioventricular canal (AVC)     irregular heart beats  . Diverticulosis   . Barrett esophagus   . Sleep apnea   . Gout   . Vitreoretinal degeneration   . Ocular hypertension   . Hemangioma     liver  . Skin cancer   . Cancer [facility]) 2002    prostate  . Peripheral vascular disease [facility])     Past Surgical History  Procedure Laterality Date  . Prostate surgery  2002  . Coronary angioplasty with stent placement  2012  . Intraocular lens insertion    . Hernia repair      x2  . Cataract extraction  2011, 2012  . Rotator cuff repair Right   . Nose surgery      submucous resection    There were no vitals filed for this visit.  Visit Diagnosis:  Unsteady gait  Weakness      Subjective Assessment - 12/29/15 1434    Subjective Patient has no pain today.   Currently in Pain? No/denies      THER-EX Nustep L3 x 5 minutes for warm-up during history (3 minutes unbilled); Quantum double leg press 105# x 10, 120# x 10, 135# x 10; Heel raises with UE support and  toes on 2x4, 2 x 10; Mini squats with RTB around knees to prevent valgus 2 x 10; RTB side stepping in // bars 4 lengths x 2; Sit to stand without UE support RTB around knees to prevent valgus 2 x 10;  NEUROMUSCULAR RE-ED Gait in hallway with horizontal and vertical head turns 80' x 2; Standing slow marching without UE support 2 x 10; Toe taps on BOSU x 10 bilateral, alternating; Modified tandem stance eyes open/closed x 30 seconds each alternating LE; Modified tandem stance eyes open with horizontal and vertical head turns alternating LE CGA and Min  verbal cues used throughout with increased in postural sway and LOB most seen with narrow base of support and while on uneven surfaces. Continues to have balance deficits typical with diagnosis. Patient performs intermediate level exercises without pain behaviors and needs verbal cuing for postural alignment and head positioning.                           PT Education - 12/29/15 1434    Education provided Yes   Education Details HEP   Person(s) Educated Patient   Methods Explanation  Comprehension Verbalized understanding             PT Long Term Goals - 12/13/15 1419    PT LONG TERM GOAL #1   Status Partially Met   PT LONG TERM GOAL #2   Status Partially Met   PT LONG TERM GOAL #3   Status Partially Met   PT LONG TERM GOAL #4   Status Partially Met               Plan - 12/29/15 1435    Clinical Impression Statement Patient has fatigue with standing exercises and needs constant VC to have correct posture   Pt will benefit from skilled therapeutic intervention in order to improve on the following deficits Abnormal gait;Decreased mobility;Impaired sensation;Decreased activity tolerance;Decreased coordination;Decreased strength   Rehab Potential Good   PT Frequency 2x / week   PT Duration 12 weeks   PT Treatment/Interventions Electrical Stimulation;Moist Heat;Ultrasound;Stair training;Gait  training;Neuromuscular re-education;Balance training;Therapeutic exercise;Therapeutic activities   PT Next Visit Plan Strengtening LE's, balance traiing   PT Home Exercise Plan HEP   Consulted and Agree with Plan of Care Patient        Problem List Patient Active Problem List   Diagnosis Date Noted  . TIA (transient ischemic attack) 10/08/2015   Alanson Puls, PT, DPT [place] S 12/29/2015, 2:37 PM  [place] MAIN [facility] SERVICES [place], [place], [address] Phone: [phone]   Fax:  [phone]  Name: Mike Decker MRN: [id number] Date of Birth: 1941/12/09

## 2016-01-03 DIAGNOSIS — Z8673 Personal history of transient ischemic attack (TIA), and cerebral infarction without residual deficits: Secondary | ICD-10-CM | POA: Diagnosis not present

## 2016-01-18 DIAGNOSIS — C44519 Basal cell carcinoma of skin of other part of trunk: Secondary | ICD-10-CM | POA: Diagnosis not present

## 2016-01-18 DIAGNOSIS — L905 Scar conditions and fibrosis of skin: Secondary | ICD-10-CM | POA: Diagnosis not present

## 2016-02-08 DIAGNOSIS — E119 Type 2 diabetes mellitus without complications: Secondary | ICD-10-CM | POA: Diagnosis not present

## 2016-02-21 DIAGNOSIS — I259 Chronic ischemic heart disease, unspecified: Secondary | ICD-10-CM | POA: Diagnosis not present

## 2016-02-21 DIAGNOSIS — I119 Hypertensive heart disease without heart failure: Secondary | ICD-10-CM | POA: Diagnosis not present

## 2016-02-21 DIAGNOSIS — I429 Cardiomyopathy, unspecified: Secondary | ICD-10-CM | POA: Diagnosis not present

## 2016-02-21 DIAGNOSIS — M7550 Bursitis of unspecified shoulder: Secondary | ICD-10-CM | POA: Diagnosis not present

## 2016-03-20 DIAGNOSIS — N183 Chronic kidney disease, stage 3 (moderate): Secondary | ICD-10-CM | POA: Diagnosis not present

## 2016-03-20 DIAGNOSIS — I129 Hypertensive chronic kidney disease with stage 1 through stage 4 chronic kidney disease, or unspecified chronic kidney disease: Secondary | ICD-10-CM | POA: Diagnosis not present

## 2016-03-20 DIAGNOSIS — N2581 Secondary hyperparathyroidism of renal origin: Secondary | ICD-10-CM | POA: Diagnosis not present

## 2016-03-20 DIAGNOSIS — E1122 Type 2 diabetes mellitus with diabetic chronic kidney disease: Secondary | ICD-10-CM | POA: Diagnosis not present

## 2016-03-28 DIAGNOSIS — I6523 Occlusion and stenosis of bilateral carotid arteries: Secondary | ICD-10-CM | POA: Diagnosis not present

## 2016-03-28 DIAGNOSIS — N183 Chronic kidney disease, stage 3 (moderate): Secondary | ICD-10-CM | POA: Diagnosis not present

## 2016-03-28 DIAGNOSIS — I739 Peripheral vascular disease, unspecified: Secondary | ICD-10-CM | POA: Diagnosis not present

## 2016-03-28 DIAGNOSIS — I70213 Atherosclerosis of native arteries of extremities with intermittent claudication, bilateral legs: Secondary | ICD-10-CM | POA: Diagnosis not present

## 2016-03-28 DIAGNOSIS — I10 Essential (primary) hypertension: Secondary | ICD-10-CM | POA: Diagnosis not present

## 2016-03-28 DIAGNOSIS — I70212 Atherosclerosis of native arteries of extremities with intermittent claudication, left leg: Secondary | ICD-10-CM | POA: Diagnosis not present

## 2016-03-28 DIAGNOSIS — E119 Type 2 diabetes mellitus without complications: Secondary | ICD-10-CM | POA: Diagnosis not present

## 2016-03-28 DIAGNOSIS — I639 Cerebral infarction, unspecified: Secondary | ICD-10-CM | POA: Diagnosis not present

## 2016-03-28 DIAGNOSIS — E785 Hyperlipidemia, unspecified: Secondary | ICD-10-CM | POA: Diagnosis not present

## 2016-04-03 DIAGNOSIS — Z794 Long term (current) use of insulin: Secondary | ICD-10-CM | POA: Diagnosis not present

## 2016-04-03 DIAGNOSIS — E1165 Type 2 diabetes mellitus with hyperglycemia: Secondary | ICD-10-CM | POA: Diagnosis not present

## 2016-04-03 DIAGNOSIS — E1122 Type 2 diabetes mellitus with diabetic chronic kidney disease: Secondary | ICD-10-CM | POA: Diagnosis not present

## 2016-04-03 DIAGNOSIS — N183 Chronic kidney disease, stage 3 (moderate): Secondary | ICD-10-CM | POA: Diagnosis not present

## 2016-04-10 DIAGNOSIS — E1151 Type 2 diabetes mellitus with diabetic peripheral angiopathy without gangrene: Secondary | ICD-10-CM | POA: Diagnosis not present

## 2016-04-10 DIAGNOSIS — N183 Chronic kidney disease, stage 3 (moderate): Secondary | ICD-10-CM | POA: Diagnosis not present

## 2016-04-10 DIAGNOSIS — Z794 Long term (current) use of insulin: Secondary | ICD-10-CM | POA: Diagnosis not present

## 2016-04-10 DIAGNOSIS — E1165 Type 2 diabetes mellitus with hyperglycemia: Secondary | ICD-10-CM | POA: Diagnosis not present

## 2016-04-10 DIAGNOSIS — I10 Essential (primary) hypertension: Secondary | ICD-10-CM | POA: Diagnosis not present

## 2016-04-10 DIAGNOSIS — E1122 Type 2 diabetes mellitus with diabetic chronic kidney disease: Secondary | ICD-10-CM | POA: Diagnosis not present

## 2016-04-10 DIAGNOSIS — E1142 Type 2 diabetes mellitus with diabetic polyneuropathy: Secondary | ICD-10-CM | POA: Diagnosis not present

## 2016-04-12 DIAGNOSIS — I2510 Atherosclerotic heart disease of native coronary artery without angina pectoris: Secondary | ICD-10-CM | POA: Diagnosis not present

## 2016-04-12 DIAGNOSIS — E119 Type 2 diabetes mellitus without complications: Secondary | ICD-10-CM | POA: Diagnosis not present

## 2016-04-12 DIAGNOSIS — I208 Other forms of angina pectoris: Secondary | ICD-10-CM | POA: Diagnosis not present

## 2016-04-12 DIAGNOSIS — N182 Chronic kidney disease, stage 2 (mild): Secondary | ICD-10-CM | POA: Diagnosis not present

## 2016-04-12 DIAGNOSIS — E669 Obesity, unspecified: Secondary | ICD-10-CM | POA: Diagnosis not present

## 2016-04-12 DIAGNOSIS — R0602 Shortness of breath: Secondary | ICD-10-CM | POA: Diagnosis not present

## 2016-04-12 DIAGNOSIS — K219 Gastro-esophageal reflux disease without esophagitis: Secondary | ICD-10-CM | POA: Diagnosis not present

## 2016-04-12 DIAGNOSIS — Z01818 Encounter for other preprocedural examination: Secondary | ICD-10-CM | POA: Diagnosis not present

## 2016-04-12 DIAGNOSIS — J329 Chronic sinusitis, unspecified: Secondary | ICD-10-CM | POA: Diagnosis not present

## 2016-04-12 DIAGNOSIS — I739 Peripheral vascular disease, unspecified: Secondary | ICD-10-CM | POA: Diagnosis not present

## 2016-04-12 DIAGNOSIS — M1A00X0 Idiopathic chronic gout, unspecified site, without tophus (tophi): Secondary | ICD-10-CM | POA: Diagnosis not present

## 2016-04-12 DIAGNOSIS — E784 Other hyperlipidemia: Secondary | ICD-10-CM | POA: Diagnosis not present

## 2016-04-23 ENCOUNTER — Other Ambulatory Visit: Payer: Self-pay | Admitting: Nurse Practitioner

## 2016-04-23 DIAGNOSIS — D1803 Hemangioma of intra-abdominal structures: Secondary | ICD-10-CM

## 2016-04-23 DIAGNOSIS — R1084 Generalized abdominal pain: Secondary | ICD-10-CM

## 2016-04-23 DIAGNOSIS — K5909 Other constipation: Secondary | ICD-10-CM | POA: Diagnosis not present

## 2016-04-23 DIAGNOSIS — Z8719 Personal history of other diseases of the digestive system: Secondary | ICD-10-CM | POA: Diagnosis not present

## 2016-04-23 DIAGNOSIS — K219 Gastro-esophageal reflux disease without esophagitis: Secondary | ICD-10-CM | POA: Diagnosis not present

## 2016-04-23 DIAGNOSIS — K5730 Diverticulosis of large intestine without perforation or abscess without bleeding: Secondary | ICD-10-CM | POA: Diagnosis not present

## 2016-04-26 DIAGNOSIS — R0602 Shortness of breath: Secondary | ICD-10-CM | POA: Diagnosis not present

## 2016-04-26 DIAGNOSIS — I2510 Atherosclerotic heart disease of native coronary artery without angina pectoris: Secondary | ICD-10-CM | POA: Diagnosis not present

## 2016-04-26 DIAGNOSIS — I208 Other forms of angina pectoris: Secondary | ICD-10-CM | POA: Diagnosis not present

## 2016-04-26 DIAGNOSIS — Z01818 Encounter for other preprocedural examination: Secondary | ICD-10-CM | POA: Diagnosis not present

## 2016-04-27 ENCOUNTER — Emergency Department
Admission: EM | Admit: 2016-04-27 | Discharge: 2016-04-27 | Disposition: A | Discharge status: Home / Self Care | Payer: Medicare Other | Attending: Emergency Medicine | Admitting: Emergency Medicine

## 2016-04-27 ENCOUNTER — Emergency Department: Payer: Medicare Other

## 2016-04-27 DIAGNOSIS — J439 Emphysema, unspecified: Secondary | ICD-10-CM | POA: Diagnosis not present

## 2016-04-27 DIAGNOSIS — Z8546 Personal history of malignant neoplasm of prostate: Secondary | ICD-10-CM | POA: Insufficient documentation

## 2016-04-27 DIAGNOSIS — E785 Hyperlipidemia, unspecified: Secondary | ICD-10-CM | POA: Diagnosis not present

## 2016-04-27 DIAGNOSIS — M1990 Unspecified osteoarthritis, unspecified site: Secondary | ICD-10-CM | POA: Insufficient documentation

## 2016-04-27 DIAGNOSIS — Z85828 Personal history of other malignant neoplasm of skin: Secondary | ICD-10-CM | POA: Insufficient documentation

## 2016-04-27 DIAGNOSIS — Z8673 Personal history of transient ischemic attack (TIA), and cerebral infarction without residual deficits: Secondary | ICD-10-CM | POA: Insufficient documentation

## 2016-04-27 DIAGNOSIS — Z79899 Other long term (current) drug therapy: Secondary | ICD-10-CM | POA: Insufficient documentation

## 2016-04-27 DIAGNOSIS — I739 Peripheral vascular disease, unspecified: Secondary | ICD-10-CM | POA: Diagnosis not present

## 2016-04-27 DIAGNOSIS — H40059 Ocular hypertension, unspecified eye: Secondary | ICD-10-CM | POA: Insufficient documentation

## 2016-04-27 DIAGNOSIS — R042 Hemoptysis: Secondary | ICD-10-CM | POA: Diagnosis not present

## 2016-04-27 DIAGNOSIS — I252 Old myocardial infarction: Secondary | ICD-10-CM | POA: Insufficient documentation

## 2016-04-27 DIAGNOSIS — Z7982 Long term (current) use of aspirin: Secondary | ICD-10-CM | POA: Insufficient documentation

## 2016-04-27 DIAGNOSIS — Z87891 Personal history of nicotine dependence: Secondary | ICD-10-CM | POA: Insufficient documentation

## 2016-04-27 DIAGNOSIS — E119 Type 2 diabetes mellitus without complications: Secondary | ICD-10-CM | POA: Insufficient documentation

## 2016-04-27 DIAGNOSIS — Z794 Long term (current) use of insulin: Secondary | ICD-10-CM | POA: Insufficient documentation

## 2016-04-27 LAB — COMPREHENSIVE METABOLIC PANEL
ALBUMIN: 4.4 g/dL (ref 3.5–5.0)
ALK PHOS: 75 U/L (ref 38–126)
ALT: 12 U/L — AB (ref 17–63)
AST: 18 U/L (ref 15–41)
Anion gap: 8 (ref 5–15)
BUN: 34 mg/dL — ABNORMAL HIGH (ref 6–20)
CALCIUM: 9.2 mg/dL (ref 8.9–10.3)
CHLORIDE: 105 mmol/L (ref 101–111)
CO2: 25 mmol/L (ref 22–32)
CREATININE: 1.78 mg/dL — AB (ref 0.61–1.24)
GFR calc non Af Amer: 36 mL/min — ABNORMAL LOW (ref >60)
GFR, EST AFRICAN AMERICAN: 41 mL/min — AB (ref >60)
GLUCOSE: 75 mg/dL (ref 65–99)
Potassium: 4.1 mmol/L (ref 3.5–5.1)
SODIUM: 138 mmol/L (ref 135–145)
Total Bilirubin: 0.4 mg/dL (ref 0.3–1.2)
Total Protein: 7.1 g/dL (ref 6.5–8.1)

## 2016-04-27 LAB — CBC
HCT: 37.4 % — ABNORMAL LOW (ref 40.0–52.0)
HEMOGLOBIN: 12.5 g/dL — AB (ref 13.0–18.0)
MCH: 29.7 pg (ref 26.0–34.0)
MCHC: 33.3 g/dL (ref 32.0–36.0)
MCV: 89.4 fL (ref 80.0–100.0)
PLATELETS: 242 10*3/uL (ref 150–440)
RBC: 4.19 MIL/uL — AB (ref 4.40–5.90)
RDW: 15.1 % — ABNORMAL HIGH (ref 11.5–14.5)
WBC: 9.2 10*3/uL (ref 3.8–10.6)

## 2016-04-27 MED ORDER — PREDNISONE 10 MG PO TABS
ORAL_TABLET | ORAL | Status: AC
  Filled 2016-04-27: qty 4

## 2016-04-27 MED ORDER — DEXAMETHASONE 4 MG PO TABS
8.0000 mg | ORAL_TABLET | Freq: Once | ORAL | Status: AC
  Administered 2016-04-27: 8 mg via ORAL

## 2016-04-27 MED ORDER — DEXAMETHASONE 4 MG PO TABS
ORAL_TABLET | ORAL | Status: AC
  Filled 2016-04-27: qty 2

## 2016-04-27 NOTE — Discharge Instructions (Signed)
You have been seen in the emergency department for hemoptysis. Your workup has not shown any concerning findings. You have been dosed with a one-time dose of steroids in the emergency department. Please monitor your blood glucose carefully the next several days. Please follow up with ENT for further evaluation if symptoms recur, or throat inflammation does not improve. Return to the emergency department immediately for any increased bleeding, any trouble breathing, or any shortness of breath.   Hemoptysis Hemoptysis, which means coughing up blood, can be a sign of a minor problem or a serious medical condition. The blood that is coughed up may come from the lungs and airways. Coughed-up blood can also come from bleeding that occurs outside the lungs and airways. Blood can drain into the windpipe during a severe nosebleed or when blood is vomited from the stomach. Because hemoptysis can be a sign of something serious, a medical evaluation is required. For some people with hemoptysis, no definite cause is ever identified. CAUSES  The most common cause of hemoptysis is bronchitis. Some other common causes include:   A ruptured blood vessel caused by coughing or an infection.   A medical condition that causes damage to the large air passageways (bronchiectasis).   A blood clot in the lungs (pulmonary embolism).   Pneumonia.   Tuberculosis.   Breathing in a small foreign object.   Cancer. For some people with hemoptysis, no definite cause is ever identified.  HOME CARE INSTRUCTIONS  Only take over-the-counter or prescription medicines as directed by your caregiver. Do not use cough suppressants unless your caregiver approves.  If your caregiver prescribes antibiotic medicines, take them as directed. Finish them even if you start to feel better.  Do not smoke. Also avoid secondhand smoke.  Follow up with your caregiver as directed. SEEK IMMEDIATE MEDICAL CARE IF:   You cough up  bloody mucus for longer than a week.  You have a blood-producing cough that is severe or getting worse.  You have a blood-producing cough thatcomes and goes over time.  You develop problems with your breathing.   You vomit blood.  You develop bloody or black-colored stools.  You have chest pain.   You develop night sweats.  You feel faint or pass out.   You have a fever or persistent symptoms for more than 2-3 days.  You have a fever and your symptoms suddenly get worse. MAKE SURE YOU:  Understand these instructions.  Will watch your condition.  Will get help right away if you are not doing well or get worse.   This information is not intended to replace advice given to you by your health care provider. Make sure you discuss any questions you have with your health care provider.   Document Released: 02/04/2002 Document Revised: 11/12/2012 Document Reviewed: 09/12/2012 Elsevier Interactive Patient Education [employer].

## 2016-04-27 NOTE — ED Notes (Signed)
Pt reports he has a constant cough due to being on blood pressure medication and having sinus drainage. Today pt noticed he was coughing up blood. Pt is on Plavix and also reports he has [employer], broadcasting/film/video. Pt denies other co's.

## 2016-04-27 NOTE — ED Provider Notes (Signed)
[facility] Emergency Department Provider Note  Time seen: 9:33 PM  I have reviewed the triage vital signs and the nursing notes.   HISTORY  Chief Complaint Hemoptysis    HPI Mike Decker is a 75 y.o. male with a past medical history of diabetes, hyperlipidemia, CAD, MI, hypertension, presents the emergency department with hemoptysis. According to the patient he has had CVA/TIA, the patient is now on clopidogrel and aspirin. Patient states he has been feeling increased secretions/mucus in his throat. He was clearing his throat and coughing today when he noticed some blood streaking in his sputum. Patient states he has a chronic dry cough which she believes is due to taking lisinopril. Denies any recent increase in the cough. Denies any chest pain, trouble breathing or fever. Patient is a retired Statistician, he states he feels like his lungs are okay. He is having congestion and inflammation in his throat. Denies any sore throat or difficulty swallowing.     Past Medical History  Diagnosis Date  . Arthritis   . Colon polyp   . Diabetes mellitus without complication ([place])   . Hyperlipidemia   . Myocardial infarct ([place])   . Heart disease   . Hypertension   . Atrioventricular canal (AVC)     irregular heart beats  . Diverticulosis   . Barrett esophagus   . Sleep apnea   . Gout   . Vitreoretinal degeneration   . Ocular hypertension   . Hemangioma     liver  . Skin cancer   . Cancer [facility]) 2002    prostate  . Peripheral vascular disease [facility])     Patient Active Problem List   Diagnosis Date Noted  . TIA (transient ischemic attack) 10/08/2015    Past Surgical History  Procedure Laterality Date  . Prostate surgery  2002  . Coronary angioplasty with stent placement  2012  . Intraocular lens insertion    . Hernia repair      x2  . Cataract extraction  2011, 2012  . Rotator cuff repair Right   . Nose surgery      submucous resection     Current Outpatient Rx  Name  Route  Sig  Dispense  Refill  . acetaminophen (TYLENOL) 500 MG tablet   Oral   Take 1,000 mg by mouth every 6 (six) hours as needed for mild pain, moderate pain or headache.         . allopurinol (ZYLOPRIM) 100 MG tablet   Oral   Take 200 mg by mouth daily.         Marland Kitchen aspirin (ASPIRIN CHILDRENS) 81 MG chewable tablet   Oral   Chew 1 tablet (81 mg total) by mouth daily.   120 tablet   0     Take asa 81 mg daily for 3 months then stop and th ...   . cetirizine (ZYRTEC) 10 MG tablet   Oral   Take 10 mg by mouth daily as needed for allergies or rhinitis.          . Cholecalciferol (VITAMIN D3) 50000 UNITS CAPS   Oral   Take 50,000 Units by mouth every 30 (thirty) days.         . citalopram (CELEXA) 10 MG tablet   Oral   Take 10 mg by mouth daily.         . cloNIDine (CATAPRES) 0.1 MG tablet   Oral   Take 1 tablet (0.1 mg total) by  mouth 2 (two) times daily.   60 tablet   11   . clopidogrel (PLAVIX) 75 MG tablet   Oral   Take 1 tablet (75 mg total) by mouth daily.   30 tablet   0   . EPINEPHrine (EPI-PEN) 0.3 mg/0.3 mL SOAJ injection   Intramuscular   Inject into the muscle as needed (anaphylaxis).          . furosemide (LASIX) 40 MG tablet   Oral   Take 40 mg by mouth daily.         . insulin aspart (NOVOLOG FLEXPEN) 100 UNIT/ML FlexPen   Subcutaneous   Inject 20-32 Units into the skin 2 (two) times daily before lunch and supper. Take per sliding scale.         . insulin glargine (LANTUS) 100 UNIT/ML injection   Subcutaneous   Inject 75 Units into the skin at bedtime.          . isosorbide mononitrate (IMDUR) 120 MG 24 hr tablet   Oral   Take 1 tablet (120 mg total) by mouth daily.   30 tablet   0   . metoprolol (LOPRESSOR) 50 MG tablet   Oral   Take 50 mg by mouth 2 (two) times daily.         . nitroGLYCERIN (NITROSTAT) 0.4 MG SL tablet   Sublingual   Place 0.4 mg under the tongue every 5 (five)  minutes x 3 doses as needed for chest pain.          . pantoprazole (PROTONIX) 40 MG tablet   Oral   Take 40 mg by mouth 2 (two) times daily.         . simvastatin (ZOCOR) 20 MG tablet   Oral   Take 20 mg by mouth at bedtime.            Allergies Peanut-containing drug products; Penicillins; Bee venom; Influenza vaccines; Inh; Kenalog; and Nalfon  Family History  Problem Relation Age of Onset  . Stroke Maternal Grandfather     Social History Social History  Substance Use Topics  . Smoking status: Former Smoker -- 20 years    Types: Cigarettes    Quit date: 12/10/1976  . Smokeless tobacco: Never Used  . Alcohol Use: No    Review of Systems Constitutional: Negative for fever. Cardiovascular: Negative for chest pain. Respiratory: Negative for shortness of breath. Hemoptysis 1. Gastrointestinal: Negative for abdominal pain Musculoskeletal: Negative for back pain. Neurological: Negative for headache 10-point ROS otherwise negative.  ____________________________________________   PHYSICAL EXAM:  VITAL SIGNS: ED Triage Vitals  Enc Vitals Group     BP 04/27/16 1950 211/65 mmHg     Pulse Rate 04/27/16 1950 83     Resp 04/27/16 1950 20     Temp 04/27/16 1950 98 F (36.7 C)     Temp Source 04/27/16 1950 Oral     SpO2 04/27/16 1950 95 %     Weight 04/27/16 1950 195 lb (88.451 kg)     Height 04/27/16 1950 5\' 6"  (1.676 m)     Head Cir --      Peak Flow --      Pain Score 04/27/16 1954 0     Pain Loc --      Pain Edu? --      Excl. in [place]? --     Constitutional: Alert and oriented. Well appearing and in no distress. Eyes: Normal exam ENT   Head: Normocephalic and atraumatic.  Mouth/Throat: Mucous membranes are moist.No pharyngeal irritation/erythema Cardiovascular: Normal rate, regular rhythm. No murmur Respiratory: Normal respiratory effort without tachypnea nor retractions. Breath sounds are clear  Gastrointestinal: Soft and nontender. No  distention. Neurologic:  Normal speech and language. No gross focal neurologic deficits Skin:  Skin is warm, dry and intact.  Psychiatric: Mood and affect are normal.   ____________________________________________     RADIOLOGY  CT shows no acute findings.  ____________________________________________    INITIAL IMPRESSION / ASSESSMENT AND PLAN / ED COURSE  Pertinent labs & imaging results that were available during my care of the patient were reviewed by me and considered in my medical decision making (see chart for details).  Patient presents to the emergency department with hemoptysis 1. Patient states he has been feeling increased mucus and secretions in his throat recently. He was clearing his throat and coughing today when he noticed blood streak in his sputum. Patient has had no further hemoptysis since. Denies any chest pain, shortness of breath or fever at any point. Unfortunately patient has an elevated creatinine due to chronic kidney disease. I discussed with the patient. He is agreeable to a CT scan of the chest without contrast to help rule out masses, tumors, etc. The patient does have a remote smoking history but quit approximately 30 years ago. No shortness of breath, chest pain or lower extremity pain. Do not suspect PE.  CT without contrast is negative. We'll discharge patient home. He has received a one-time dose of Decadron, he was concerned about blood sugar spikes while taking 5 days worth of prednisone. We'll have the patient follow-up with ENT, as he feels that the mucus and blood is coming from the throat/larynx, not the lungs. Patient has had no further bloody sputum. None produced in the emergency department. The patient is agreeable to this plan.  ____________________________________________   FINAL CLINICAL IMPRESSION(S) / ED DIAGNOSES  Hemoptysis   Harvest Dark, MD 04/27/16 2314

## 2016-05-02 ENCOUNTER — Ambulatory Visit
Admission: RE | Admit: 2016-05-02 | Discharge: 2016-05-02 | Disposition: A | Discharge status: Home / Self Care | Payer: Medicare Other | Source: Ambulatory Visit | Attending: Nurse Practitioner | Admitting: Nurse Practitioner

## 2016-05-02 DIAGNOSIS — R1310 Dysphagia, unspecified: Secondary | ICD-10-CM | POA: Diagnosis not present

## 2016-05-02 DIAGNOSIS — D1803 Hemangioma of intra-abdominal structures: Secondary | ICD-10-CM | POA: Diagnosis not present

## 2016-05-02 DIAGNOSIS — R1084 Generalized abdominal pain: Secondary | ICD-10-CM

## 2016-05-02 DIAGNOSIS — K219 Gastro-esophageal reflux disease without esophagitis: Secondary | ICD-10-CM | POA: Diagnosis not present

## 2016-05-02 DIAGNOSIS — K4090 Unilateral inguinal hernia, without obstruction or gangrene, not specified as recurrent: Secondary | ICD-10-CM | POA: Diagnosis not present

## 2016-05-02 DIAGNOSIS — K5730 Diverticulosis of large intestine without perforation or abscess without bleeding: Secondary | ICD-10-CM | POA: Diagnosis not present

## 2016-05-02 DIAGNOSIS — I69291 Dysphagia following other nontraumatic intracranial hemorrhage: Secondary | ICD-10-CM | POA: Diagnosis not present

## 2016-05-02 DIAGNOSIS — Z87891 Personal history of nicotine dependence: Secondary | ICD-10-CM | POA: Diagnosis not present

## 2016-05-16 DIAGNOSIS — K920 Hematemesis: Secondary | ICD-10-CM | POA: Diagnosis not present

## 2016-05-16 DIAGNOSIS — K210 Gastro-esophageal reflux disease with esophagitis: Secondary | ICD-10-CM | POA: Diagnosis not present

## 2016-05-16 DIAGNOSIS — K2270 Barrett's esophagus without dysplasia: Secondary | ICD-10-CM | POA: Diagnosis not present

## 2016-05-16 DIAGNOSIS — D649 Anemia, unspecified: Secondary | ICD-10-CM | POA: Diagnosis not present

## 2016-05-17 DIAGNOSIS — K922 Gastrointestinal hemorrhage, unspecified: Secondary | ICD-10-CM | POA: Diagnosis not present

## 2016-05-17 DIAGNOSIS — I252 Old myocardial infarction: Secondary | ICD-10-CM | POA: Diagnosis not present

## 2016-05-17 DIAGNOSIS — I214 Non-ST elevation (NSTEMI) myocardial infarction: Secondary | ICD-10-CM | POA: Diagnosis not present

## 2016-05-17 DIAGNOSIS — M7550 Bursitis of unspecified shoulder: Secondary | ICD-10-CM | POA: Diagnosis not present

## 2016-05-17 DIAGNOSIS — I119 Hypertensive heart disease without heart failure: Secondary | ICD-10-CM | POA: Diagnosis not present

## 2016-05-23 DIAGNOSIS — E784 Other hyperlipidemia: Secondary | ICD-10-CM | POA: Diagnosis not present

## 2016-05-23 DIAGNOSIS — D509 Iron deficiency anemia, unspecified: Secondary | ICD-10-CM | POA: Diagnosis not present

## 2016-05-23 DIAGNOSIS — R5381 Other malaise: Secondary | ICD-10-CM | POA: Diagnosis not present

## 2016-05-23 DIAGNOSIS — I10 Essential (primary) hypertension: Secondary | ICD-10-CM | POA: Diagnosis not present

## 2016-05-24 DIAGNOSIS — I214 Non-ST elevation (NSTEMI) myocardial infarction: Secondary | ICD-10-CM | POA: Diagnosis not present

## 2016-05-24 DIAGNOSIS — I259 Chronic ischemic heart disease, unspecified: Secondary | ICD-10-CM | POA: Diagnosis not present

## 2016-05-24 DIAGNOSIS — I43 Cardiomyopathy in diseases classified elsewhere: Secondary | ICD-10-CM | POA: Diagnosis not present

## 2016-05-24 DIAGNOSIS — I119 Hypertensive heart disease without heart failure: Secondary | ICD-10-CM | POA: Diagnosis not present

## 2016-06-01 ENCOUNTER — Ambulatory Visit: Payer: Medicare Other | Admitting: Anesthesiology

## 2016-06-01 ENCOUNTER — Ambulatory Visit
Admission: RE | Admit: 2016-06-01 | Discharge: 2016-06-01 | Disposition: A | Discharge status: Home / Self Care | Payer: Medicare Other | Source: Ambulatory Visit | Attending: Unknown Physician Specialty | Admitting: Unknown Physician Specialty

## 2016-06-01 ENCOUNTER — Encounter
Admission: RE | Disposition: A | Discharge status: Home / Self Care | Payer: Self-pay | Source: Ambulatory Visit | Attending: Unknown Physician Specialty

## 2016-06-01 DIAGNOSIS — Z888 Allergy status to other drugs, medicaments and biological substances status: Secondary | ICD-10-CM | POA: Insufficient documentation

## 2016-06-01 DIAGNOSIS — D1803 Hemangioma of intra-abdominal structures: Secondary | ICD-10-CM | POA: Diagnosis not present

## 2016-06-01 DIAGNOSIS — Z880 Allergy status to penicillin: Secondary | ICD-10-CM | POA: Diagnosis not present

## 2016-06-01 DIAGNOSIS — Z794 Long term (current) use of insulin: Secondary | ICD-10-CM | POA: Diagnosis not present

## 2016-06-01 DIAGNOSIS — I739 Peripheral vascular disease, unspecified: Secondary | ICD-10-CM | POA: Diagnosis not present

## 2016-06-01 DIAGNOSIS — E119 Type 2 diabetes mellitus without complications: Secondary | ICD-10-CM | POA: Diagnosis not present

## 2016-06-01 DIAGNOSIS — Z7902 Long term (current) use of antithrombotics/antiplatelets: Secondary | ICD-10-CM | POA: Diagnosis not present

## 2016-06-01 DIAGNOSIS — M1990 Unspecified osteoarthritis, unspecified site: Secondary | ICD-10-CM | POA: Diagnosis not present

## 2016-06-01 DIAGNOSIS — Z85828 Personal history of other malignant neoplasm of skin: Secondary | ICD-10-CM | POA: Diagnosis not present

## 2016-06-01 DIAGNOSIS — G4730 Sleep apnea, unspecified: Secondary | ICD-10-CM | POA: Insufficient documentation

## 2016-06-01 DIAGNOSIS — R1310 Dysphagia, unspecified: Secondary | ICD-10-CM | POA: Diagnosis not present

## 2016-06-01 DIAGNOSIS — K2270 Barrett's esophagus without dysplasia: Secondary | ICD-10-CM | POA: Diagnosis not present

## 2016-06-01 DIAGNOSIS — Z823 Family history of stroke: Secondary | ICD-10-CM | POA: Insufficient documentation

## 2016-06-01 DIAGNOSIS — R1084 Generalized abdominal pain: Secondary | ICD-10-CM | POA: Diagnosis not present

## 2016-06-01 DIAGNOSIS — K449 Diaphragmatic hernia without obstruction or gangrene: Secondary | ICD-10-CM | POA: Insufficient documentation

## 2016-06-01 DIAGNOSIS — Z87891 Personal history of nicotine dependence: Secondary | ICD-10-CM | POA: Diagnosis not present

## 2016-06-01 DIAGNOSIS — K22719 Barrett's esophagus with dysplasia, unspecified: Secondary | ICD-10-CM | POA: Diagnosis not present

## 2016-06-01 DIAGNOSIS — I252 Old myocardial infarction: Secondary | ICD-10-CM | POA: Diagnosis not present

## 2016-06-01 DIAGNOSIS — Z8719 Personal history of other diseases of the digestive system: Secondary | ICD-10-CM | POA: Diagnosis present

## 2016-06-01 DIAGNOSIS — Z86010 Personal history of colonic polyps: Secondary | ICD-10-CM | POA: Diagnosis not present

## 2016-06-01 DIAGNOSIS — I119 Hypertensive heart disease without heart failure: Secondary | ICD-10-CM | POA: Diagnosis not present

## 2016-06-01 DIAGNOSIS — Z7982 Long term (current) use of aspirin: Secondary | ICD-10-CM | POA: Diagnosis not present

## 2016-06-01 DIAGNOSIS — Z8546 Personal history of malignant neoplasm of prostate: Secondary | ICD-10-CM | POA: Diagnosis not present

## 2016-06-01 DIAGNOSIS — K219 Gastro-esophageal reflux disease without esophagitis: Secondary | ICD-10-CM | POA: Diagnosis present

## 2016-06-01 DIAGNOSIS — Z79899 Other long term (current) drug therapy: Secondary | ICD-10-CM | POA: Insufficient documentation

## 2016-06-01 DIAGNOSIS — K22711 Barrett's esophagus with high grade dysplasia: Secondary | ICD-10-CM | POA: Diagnosis not present

## 2016-06-01 DIAGNOSIS — Z955 Presence of coronary angioplasty implant and graft: Secondary | ICD-10-CM | POA: Insufficient documentation

## 2016-06-01 DIAGNOSIS — Z887 Allergy status to serum and vaccine status: Secondary | ICD-10-CM | POA: Insufficient documentation

## 2016-06-01 DIAGNOSIS — E785 Hyperlipidemia, unspecified: Secondary | ICD-10-CM | POA: Diagnosis not present

## 2016-06-01 DIAGNOSIS — M109 Gout, unspecified: Secondary | ICD-10-CM | POA: Diagnosis not present

## 2016-06-01 DIAGNOSIS — Z91010 Allergy to peanuts: Secondary | ICD-10-CM | POA: Diagnosis not present

## 2016-06-01 DIAGNOSIS — Z91030 Bee allergy status: Secondary | ICD-10-CM | POA: Diagnosis not present

## 2016-06-01 DIAGNOSIS — G459 Transient cerebral ischemic attack, unspecified: Secondary | ICD-10-CM

## 2016-06-01 HISTORY — PX: ESOPHAGOGASTRODUODENOSCOPY (EGD) WITH PROPOFOL: SHX5813

## 2016-06-01 LAB — GLUCOSE, CAPILLARY: Glucose-Capillary: 99 mg/dL (ref 65–99)

## 2016-06-01 SURGERY — ESOPHAGOGASTRODUODENOSCOPY (EGD) WITH PROPOFOL
Anesthesia: General

## 2016-06-01 MED ORDER — MIDAZOLAM HCL 5 MG/5ML IJ SOLN
INTRAMUSCULAR | Status: DC | PRN
  Administered 2016-06-01: 1 mg via INTRAVENOUS

## 2016-06-01 MED ORDER — SODIUM CHLORIDE 0.9 % IV SOLN: INTRAVENOUS | Status: DC

## 2016-06-01 MED ORDER — SODIUM CHLORIDE 0.9 % IV SOLN
INTRAVENOUS | Status: DC
  Administered 2016-06-01: 1000 mL via INTRAVENOUS

## 2016-06-01 MED ORDER — FENTANYL CITRATE (PF) 100 MCG/2ML IJ SOLN
INTRAMUSCULAR | Status: DC | PRN
  Administered 2016-06-01: 50 ug via INTRAVENOUS

## 2016-06-01 MED ORDER — PROPOFOL 10 MG/ML IV BOLUS
INTRAVENOUS | Status: DC | PRN
  Administered 2016-06-01: 50 mg via INTRAVENOUS
  Administered 2016-06-01: 15 mg via INTRAVENOUS

## 2016-06-01 MED ORDER — PROPOFOL 500 MG/50ML IV EMUL
INTRAVENOUS | Status: DC | PRN
  Administered 2016-06-01: 140 ug/kg/min via INTRAVENOUS

## 2016-06-01 NOTE — Transfer of Care (Signed)
Immediate Anesthesia Transfer of Care Note  Patient: Mike Decker  Procedure(s) Performed: Procedure(s): ESOPHAGOGASTRODUODENOSCOPY (EGD) WITH PROPOFOL (N/A)  Patient Location: PACU and Endoscopy Unit  Anesthesia Type:General  Level of Consciousness: awake, alert  and oriented  Airway & Oxygen Therapy: Patient Spontanous Breathing and Patient connected to nasal cannula oxygen  Post-op Assessment: Report given to RN and Post -op Vital signs reviewed and stable  Post vital signs: Reviewed and stable  Last Vitals:  Filed Vitals:   06/01/16 0652 06/01/16 0753  BP: 180/85   Pulse: 65   Temp: 36.3 C 36 C  Resp: 16     Last Pain: There were no vitals filed for this visit.       Complications: No apparent anesthesia complications

## 2016-06-01 NOTE — Anesthesia Procedure Notes (Signed)
Date/Time: 06/01/2016 7:36 AM Performed by: Kennon Holter Pre-anesthesia Checklist: Patient identified, Emergency Drugs available, Timeout performed, Patient being monitored and Suction available Patient Re-evaluated:Patient Re-evaluated prior to inductionOxygen Delivery Method: Nasal cannula Intubation Type: IV induction Placement Confirmation: positive ETCO2

## 2016-06-01 NOTE — Anesthesia Postprocedure Evaluation (Signed)
Anesthesia Post Note  Patient: Mike Decker  Procedure(s) Performed: Procedure(s) (LRB): ESOPHAGOGASTRODUODENOSCOPY (EGD) WITH PROPOFOL (N/A)  Patient location during evaluation: Endoscopy Anesthesia Type: General Level of consciousness: awake and alert Pain management: pain level controlled Vital Signs Assessment: post-procedure vital signs reviewed and stable Respiratory status: spontaneous breathing and respiratory function stable Cardiovascular status: stable Anesthetic complications: no    Last Vitals:  Filed Vitals:   06/01/16 0754 06/01/16 0804  BP: 119/44 151/55  Pulse: 67 64  Temp:    Resp: 16 19    Last Pain: There were no vitals filed for this visit.               KEPHART,WILLIAM K

## 2016-06-01 NOTE — H&P (Signed)
Primary Care Physician:  Cletis Athens, MD Primary Gastroenterologist:  Dr. Vira Agar  Pre-Procedure History & Physical: HPI:  Mike Decker is a 75 y.o. male is here for an endoscopy.   Past Medical History  Diagnosis Date  . Arthritis   . Colon polyp   . Diabetes mellitus without complication ([place])   . Hyperlipidemia   . Myocardial infarct ([place])   . Heart disease   . Hypertension   . Atrioventricular canal (AVC)     irregular heart beats  . Diverticulosis   . Barrett esophagus   . Sleep apnea   . Gout   . Vitreoretinal degeneration   . Ocular hypertension   . Hemangioma     liver  . Skin cancer   . Cancer [facility]) 2002    prostate  . Peripheral vascular disease [facility])     Past Surgical History  Procedure Laterality Date  . Prostate surgery  2002  . Coronary angioplasty with stent placement  2012  . Intraocular lens insertion    . Hernia repair      x2  . Cataract extraction  2011, 2012  . Rotator cuff repair Right   . Nose surgery      submucous resection    Prior to Admission medications   Medication Sig Start Date End Date Taking? Authorizing Provider  cloNIDine (CATAPRES) 0.1 MG tablet Take 1 tablet (0.1 mg total) by mouth 2 (two) times daily. Patient taking differently: Take 0.1 mg by mouth 2 (two) times daily. Take 0.1 mg tab before breakfast, take 0.1 mg tab x2 nightly 10/09/15  Yes Bettey Costa, MD  clopidogrel (PLAVIX) 75 MG tablet Take 1 tablet (75 mg total) by mouth daily. 10/09/15  Yes Bettey Costa, MD  colchicine 0.6 MG tablet Take 0.6 mg by mouth 3 (three) times daily.   Yes Historical Provider, MD  ergocalciferol (VITAMIN D2) 50000 units capsule Take 50,000 Units by mouth every 30 (thirty) days.   Yes Historical Provider, MD  isosorbide mononitrate (IMDUR) 60 MG 24 hr tablet Take 60 mg by mouth daily.   Yes Historical Provider, MD  lisinopril (PRINIVIL,ZESTRIL) 10 MG tablet Take 10 mg by mouth daily.   Yes Historical Provider, MD  rosuvastatin  (CRESTOR) 40 MG tablet Take 40 mg by mouth daily.   Yes Historical Provider, MD  acetaminophen (TYLENOL) 500 MG tablet Take 1,000 mg by mouth every 6 (six) hours as needed for mild pain, moderate pain or headache.    Historical Provider, MD  allopurinol (ZYLOPRIM) 100 MG tablet Take 200 mg by mouth daily.    Historical Provider, MD  aspirin (ASPIRIN CHILDRENS) 81 MG chewable tablet Chew 1 tablet (81 mg total) by mouth daily. 10/09/15   Bettey Costa, MD  cetirizine (ZYRTEC) 10 MG tablet Take 5 mg by mouth daily as needed for allergies or rhinitis.     Historical Provider, MD  Cholecalciferol (VITAMIN D3) 50000 UNITS CAPS Take 50,000 Units by mouth every 30 (thirty) days.    Historical Provider, MD  citalopram (CELEXA) 10 MG tablet Take 10 mg by mouth daily.    Historical Provider, MD  EPINEPHrine (EPI-PEN) 0.3 mg/0.3 mL SOAJ injection Inject into the muscle as needed (anaphylaxis).     Historical Provider, MD  furosemide (LASIX) 40 MG tablet Take 20 mg by mouth 2 (two) times daily.     Historical Provider, MD  insulin aspart (NOVOLOG FLEXPEN) 100 UNIT/ML FlexPen Inject 20-32 Units into the skin 2 (two) times daily before lunch  and supper. Take per sliding scale. 08/30/15   Historical Provider, MD  insulin glargine (LANTUS) 100 UNIT/ML injection Inject 85 Units into the skin at bedtime.     Historical Provider, MD  isosorbide mononitrate (IMDUR) 120 MG 24 hr tablet Take 1 tablet (120 mg total) by mouth daily. Patient not taking: Reported on 05/31/2016 10/09/15   Bettey Costa, MD  metoprolol (LOPRESSOR) 50 MG tablet Take 50 mg by mouth 2 (two) times daily.    Historical Provider, MD  nitroGLYCERIN (NITROSTAT) 0.4 MG SL tablet Place 0.4 mg under the tongue every 5 (five) minutes x 3 doses as needed for chest pain.     Historical Provider, MD  pantoprazole (PROTONIX) 40 MG tablet Take 40 mg by mouth 2 (two) times daily.    Historical Provider, MD  simvastatin (ZOCOR) 20 MG tablet Take 20 mg by mouth at bedtime.      Historical Provider, MD    Allergies as of 05/30/2016 - Review Complete 04/27/2016  Allergen Reaction Noted  . Peanut-containing drug products Anaphylaxis 03/30/2014  . Penicillins Hives and Rash 03/30/2014  . Bee venom Swelling 03/30/2014  . Influenza vaccines Hives 03/30/2014  . Inh [isoniazid] Hives 03/30/2014  . Kenalog [triamcinolone acetonide] Hives 03/30/2014  . Nalfon [fenoprofen calcium] Hives 03/30/2014    Family History  Problem Relation Age of Onset  . Stroke Maternal Grandfather     Social History   Social History  . Marital Status: Married    Spouse Name: N/A  . Number of Children: N/A  . Years of Education: N/A   Occupational History  . Not on file.   Social History Main Topics  . Smoking status: Former Smoker -- 20 years    Types: Cigarettes    Quit date: 12/10/1976  . Smokeless tobacco: Never Used  . Alcohol Use: No  . Drug Use: No  . Sexual Activity: Not on file   Other Topics Concern  . Not on file   Social History Narrative    Review of Systems: See HPI, otherwise negative ROS  Physical Exam: BP 180/85 mmHg  Pulse 65  Temp(Src) 97.4 F (36.3 C) (Tympanic)  Resp 16  Ht 5\' 6"  (1.676 m)  Wt 88.451 kg (195 lb)  BMI 31.49 kg/m2  SpO2 100% General:   Alert,  pleasant and cooperative in NAD Head:  Normocephalic and atraumatic. Neck:  Supple; no masses or thyromegaly. Lungs:  Clear throughout to auscultation.    Heart:  Regular rate and rhythm. Abdomen:  Soft, nontender and nondistended. Normal bowel sounds, without guarding, and without rebound.   Neurologic:  Alert and  oriented x4;  grossly normal neurologically.  Impression/Plan: Mike Decker is here for an endoscopy to be performed for Barretts esophagus, abdominal pain.  Risks, benefits, limitations, and alternatives regarding  endoscopy have been reviewed with the patient.  Questions have been answered.  All parties agreeable.   Gaylyn Cheers, MD  06/01/2016, 7:23  AM

## 2016-06-01 NOTE — Op Note (Signed)
[facility] Patient Name: Mike Decker Procedure Date: 06/01/2016 7:33 AM MRN: [id number] Account #: [id number] Date of Birth: 1941/02/05 Admit Type: Outpatient Age: 75 Room: [facility] ENDO ROOM 1 Gender: Male Note Status: Finalized Procedure:            Upper GI endoscopy Indications:          Follow-up of Barrett's esophagus Providers:            Manya Silvas, MD Referring MD:         Cletis Athens, MD (Referring MD) Medicines:            Propofol per Anesthesia Complications:        No immediate complications. Procedure:            Pre-Anesthesia Assessment:                       - After reviewing the risks and benefits, the patient                        was deemed in satisfactory condition to undergo the                        procedure.                       - Using IV propofol under the supervision of a CRNA was                        determined to be medically necessary for this procedure                        based on review of the patient's medical history,                        medications, and prior anesthesia history.                       - After reviewing the risks and benefits, the patient                        was deemed in satisfactory condition to undergo the                        procedure.                       After obtaining informed consent, the endoscope was                        passed under direct vision. Throughout the procedure,                        the patient's blood pressure, pulse, and oxygen                        saturations were monitored continuously. The Endoscope                        was introduced through the mouth, and advanced to the  second part of duodenum. The upper GI endoscopy was                        accomplished without difficulty. The patient tolerated                        the procedure well. Findings:      There were esophageal mucosal changes secondary to  established       long-segment Barrett's disease present in the lower third of the       esophagus. The maximum longitudinal extent of these mucosal changes was       5 cm in length. Mucosa was biopsied with a cold forceps for histology. A       total of 2 specimen bottles were sent to pathology. GEJ 31cm and       Barretts runs for 4-5cm.      A small hiatal hernia was present.      The stomach was otherwise normal.      The examined duodenum was normal. Impression:           - Esophageal mucosal changes secondary to established                        long-segment Barrett's disease. Biopsied.                       - Small hiatal hernia.                       - Normal stomach.                       - Normal examined duodenum. Recommendation:       - Await pathology results. Manya Silvas, MD 06/01/2016 7:50:10 AM This report has been signed electronically. Number of Addenda: 0 Note Initiated On: 06/01/2016 7:33 AM      [facility]

## 2016-06-01 NOTE — Anesthesia Preprocedure Evaluation (Signed)
Anesthesia Evaluation  Patient identified by MRN, date of birth, ID band Patient awake    Reviewed: Allergy & Precautions, NPO status   History of Anesthesia Complications Negative for: history of anesthetic complications  Airway Mallampati: III       Dental   Pulmonary neg pulmonary ROS, sleep apnea and Continuous Positive Airway Pressure Ventilation , former smoker,           Cardiovascular hypertension, Pt. on medications and Pt. on home beta blockers + Past MI, + Cardiac Stents and + Peripheral Vascular Disease       Neuro/Psych TIA (speach, left sided weakness)   GI/Hepatic negative GI ROS, Neg liver ROS, GERD  Medicated and Poorly Controlled,  Endo/Other  diabetes, Type 2, Oral Hypoglycemic Agents  Renal/GU Renal InsufficiencyRenal disease     Musculoskeletal   Abdominal   Peds  Hematology negative hematology ROS (+)   Anesthesia Other Findings   Reproductive/Obstetrics                             Anesthesia Physical Anesthesia Plan  ASA: III  Anesthesia Plan: General   Post-op Pain Management:    Induction:   Airway Management Planned: Nasal Cannula  Additional Equipment:   Intra-op Plan:   Post-operative Plan:   Informed Consent: I have reviewed the patients History and Physical, chart, labs and discussed the procedure including the risks, benefits and alternatives for the proposed anesthesia with the patient or authorized representative who has indicated his/her understanding and acceptance.     Plan Discussed with:   Anesthesia Plan Comments:         Anesthesia Quick Evaluation

## 2016-06-04 ENCOUNTER — Encounter: Payer: Self-pay | Admitting: Unknown Physician Specialty

## 2016-06-05 LAB — SURGICAL PATHOLOGY

## 2016-06-18 DIAGNOSIS — R5381 Other malaise: Secondary | ICD-10-CM | POA: Diagnosis not present

## 2016-06-18 DIAGNOSIS — D509 Iron deficiency anemia, unspecified: Secondary | ICD-10-CM | POA: Diagnosis not present

## 2016-06-19 DIAGNOSIS — K22711 Barrett's esophagus with high grade dysplasia: Secondary | ICD-10-CM | POA: Diagnosis not present

## 2016-06-21 DIAGNOSIS — K2270 Barrett's esophagus without dysplasia: Secondary | ICD-10-CM | POA: Diagnosis not present

## 2016-06-21 DIAGNOSIS — I209 Angina pectoris, unspecified: Secondary | ICD-10-CM | POA: Diagnosis not present

## 2016-06-21 DIAGNOSIS — I259 Chronic ischemic heart disease, unspecified: Secondary | ICD-10-CM | POA: Diagnosis not present

## 2016-06-21 DIAGNOSIS — I10 Essential (primary) hypertension: Secondary | ICD-10-CM | POA: Diagnosis not present

## 2016-06-28 DIAGNOSIS — E1151 Type 2 diabetes mellitus with diabetic peripheral angiopathy without gangrene: Secondary | ICD-10-CM | POA: Diagnosis not present

## 2016-06-28 DIAGNOSIS — Z794 Long term (current) use of insulin: Secondary | ICD-10-CM | POA: Diagnosis not present

## 2016-06-28 DIAGNOSIS — I10 Essential (primary) hypertension: Secondary | ICD-10-CM | POA: Diagnosis not present

## 2016-06-28 DIAGNOSIS — E1165 Type 2 diabetes mellitus with hyperglycemia: Secondary | ICD-10-CM | POA: Diagnosis not present

## 2016-06-28 DIAGNOSIS — Z8673 Personal history of transient ischemic attack (TIA), and cerebral infarction without residual deficits: Secondary | ICD-10-CM | POA: Diagnosis not present

## 2016-06-28 DIAGNOSIS — G459 Transient cerebral ischemic attack, unspecified: Secondary | ICD-10-CM | POA: Diagnosis not present

## 2016-06-28 DIAGNOSIS — K22711 Barrett's esophagus with high grade dysplasia: Secondary | ICD-10-CM | POA: Diagnosis not present

## 2016-07-03 DIAGNOSIS — Z08 Encounter for follow-up examination after completed treatment for malignant neoplasm: Secondary | ICD-10-CM | POA: Diagnosis not present

## 2016-07-03 DIAGNOSIS — L570 Actinic keratosis: Secondary | ICD-10-CM | POA: Diagnosis not present

## 2016-07-03 DIAGNOSIS — Z85828 Personal history of other malignant neoplasm of skin: Secondary | ICD-10-CM | POA: Diagnosis not present

## 2016-07-03 DIAGNOSIS — X32XXXA Exposure to sunlight, initial encounter: Secondary | ICD-10-CM | POA: Diagnosis not present

## 2016-07-03 DIAGNOSIS — L821 Other seborrheic keratosis: Secondary | ICD-10-CM | POA: Diagnosis not present

## 2016-07-04 DIAGNOSIS — E1122 Type 2 diabetes mellitus with diabetic chronic kidney disease: Secondary | ICD-10-CM | POA: Diagnosis not present

## 2016-07-04 DIAGNOSIS — Z8546 Personal history of malignant neoplasm of prostate: Secondary | ICD-10-CM | POA: Diagnosis not present

## 2016-07-04 DIAGNOSIS — N183 Chronic kidney disease, stage 3 (moderate): Secondary | ICD-10-CM | POA: Diagnosis not present

## 2016-07-04 DIAGNOSIS — K22711 Barrett's esophagus with high grade dysplasia: Secondary | ICD-10-CM | POA: Diagnosis not present

## 2016-07-04 DIAGNOSIS — Z8673 Personal history of transient ischemic attack (TIA), and cerebral infarction without residual deficits: Secondary | ICD-10-CM | POA: Diagnosis not present

## 2016-07-04 DIAGNOSIS — K449 Diaphragmatic hernia without obstruction or gangrene: Secondary | ICD-10-CM | POA: Diagnosis not present

## 2016-07-04 DIAGNOSIS — I129 Hypertensive chronic kidney disease with stage 1 through stage 4 chronic kidney disease, or unspecified chronic kidney disease: Secondary | ICD-10-CM | POA: Diagnosis not present

## 2016-07-04 DIAGNOSIS — G4733 Obstructive sleep apnea (adult) (pediatric): Secondary | ICD-10-CM | POA: Diagnosis not present

## 2016-07-09 DIAGNOSIS — K22711 Barrett's esophagus with high grade dysplasia: Secondary | ICD-10-CM | POA: Diagnosis not present

## 2016-07-24 DIAGNOSIS — C155 Malignant neoplasm of lower third of esophagus: Secondary | ICD-10-CM | POA: Insufficient documentation

## 2016-07-24 DIAGNOSIS — Z87891 Personal history of nicotine dependence: Secondary | ICD-10-CM | POA: Diagnosis not present

## 2016-08-16 DIAGNOSIS — C159 Malignant neoplasm of esophagus, unspecified: Secondary | ICD-10-CM | POA: Diagnosis not present

## 2016-08-16 DIAGNOSIS — I2510 Atherosclerotic heart disease of native coronary artery without angina pectoris: Secondary | ICD-10-CM | POA: Diagnosis not present

## 2016-08-16 DIAGNOSIS — I739 Peripheral vascular disease, unspecified: Secondary | ICD-10-CM | POA: Diagnosis not present

## 2016-08-16 DIAGNOSIS — Z955 Presence of coronary angioplasty implant and graft: Secondary | ICD-10-CM | POA: Diagnosis not present

## 2016-08-16 DIAGNOSIS — I129 Hypertensive chronic kidney disease with stage 1 through stage 4 chronic kidney disease, or unspecified chronic kidney disease: Secondary | ICD-10-CM | POA: Diagnosis not present

## 2016-08-16 DIAGNOSIS — C155 Malignant neoplasm of lower third of esophagus: Secondary | ICD-10-CM | POA: Diagnosis not present

## 2016-08-16 DIAGNOSIS — Z87891 Personal history of nicotine dependence: Secondary | ICD-10-CM | POA: Diagnosis not present

## 2016-08-16 DIAGNOSIS — K22711 Barrett's esophagus with high grade dysplasia: Secondary | ICD-10-CM | POA: Diagnosis not present

## 2016-08-16 DIAGNOSIS — N183 Chronic kidney disease, stage 3 (moderate): Secondary | ICD-10-CM | POA: Diagnosis not present

## 2016-08-16 DIAGNOSIS — N400 Enlarged prostate without lower urinary tract symptoms: Secondary | ICD-10-CM | POA: Diagnosis not present

## 2016-08-16 DIAGNOSIS — Z8546 Personal history of malignant neoplasm of prostate: Secondary | ICD-10-CM | POA: Diagnosis not present

## 2016-08-16 DIAGNOSIS — M109 Gout, unspecified: Secondary | ICD-10-CM | POA: Diagnosis not present

## 2016-08-16 DIAGNOSIS — E1122 Type 2 diabetes mellitus with diabetic chronic kidney disease: Secondary | ICD-10-CM | POA: Diagnosis not present

## 2016-08-16 DIAGNOSIS — E1165 Type 2 diabetes mellitus with hyperglycemia: Secondary | ICD-10-CM | POA: Diagnosis not present

## 2016-08-16 DIAGNOSIS — Z880 Allergy status to penicillin: Secondary | ICD-10-CM | POA: Diagnosis not present

## 2016-08-16 DIAGNOSIS — K449 Diaphragmatic hernia without obstruction or gangrene: Secondary | ICD-10-CM | POA: Diagnosis not present

## 2016-08-16 DIAGNOSIS — K2270 Barrett's esophagus without dysplasia: Secondary | ICD-10-CM | POA: Diagnosis not present

## 2016-08-16 DIAGNOSIS — Z7982 Long term (current) use of aspirin: Secondary | ICD-10-CM | POA: Diagnosis not present

## 2016-08-16 DIAGNOSIS — I69991 Dysphagia following unspecified cerebrovascular disease: Secondary | ICD-10-CM | POA: Diagnosis not present

## 2016-08-16 DIAGNOSIS — Z794 Long term (current) use of insulin: Secondary | ICD-10-CM | POA: Diagnosis not present

## 2016-08-16 DIAGNOSIS — G4733 Obstructive sleep apnea (adult) (pediatric): Secondary | ICD-10-CM | POA: Diagnosis not present

## 2016-08-16 DIAGNOSIS — E669 Obesity, unspecified: Secondary | ICD-10-CM | POA: Diagnosis not present

## 2016-08-21 DIAGNOSIS — R5381 Other malaise: Secondary | ICD-10-CM | POA: Diagnosis not present

## 2016-08-21 DIAGNOSIS — E876 Hypokalemia: Secondary | ICD-10-CM | POA: Diagnosis not present

## 2016-08-21 DIAGNOSIS — I10 Essential (primary) hypertension: Secondary | ICD-10-CM | POA: Diagnosis not present

## 2016-08-21 DIAGNOSIS — E875 Hyperkalemia: Secondary | ICD-10-CM | POA: Diagnosis not present

## 2016-08-23 DIAGNOSIS — I43 Cardiomyopathy in diseases classified elsewhere: Secondary | ICD-10-CM | POA: Diagnosis not present

## 2016-08-23 DIAGNOSIS — I119 Hypertensive heart disease without heart failure: Secondary | ICD-10-CM | POA: Diagnosis not present

## 2016-08-23 DIAGNOSIS — I214 Non-ST elevation (NSTEMI) myocardial infarction: Secondary | ICD-10-CM | POA: Diagnosis not present

## 2016-08-23 DIAGNOSIS — I252 Old myocardial infarction: Secondary | ICD-10-CM | POA: Diagnosis not present

## 2016-08-28 DIAGNOSIS — I10 Essential (primary) hypertension: Secondary | ICD-10-CM | POA: Diagnosis not present

## 2016-08-28 DIAGNOSIS — E1151 Type 2 diabetes mellitus with diabetic peripheral angiopathy without gangrene: Secondary | ICD-10-CM | POA: Diagnosis not present

## 2016-08-28 DIAGNOSIS — Z794 Long term (current) use of insulin: Secondary | ICD-10-CM | POA: Diagnosis not present

## 2016-08-28 DIAGNOSIS — N183 Chronic kidney disease, stage 3 (moderate): Secondary | ICD-10-CM | POA: Diagnosis not present

## 2016-08-28 DIAGNOSIS — E1142 Type 2 diabetes mellitus with diabetic polyneuropathy: Secondary | ICD-10-CM | POA: Diagnosis not present

## 2016-08-28 DIAGNOSIS — E1122 Type 2 diabetes mellitus with diabetic chronic kidney disease: Secondary | ICD-10-CM | POA: Diagnosis not present

## 2016-08-29 ENCOUNTER — Encounter (INDEPENDENT_AMBULATORY_CARE_PROVIDER_SITE_OTHER): Payer: Self-pay

## 2016-09-07 DIAGNOSIS — K22711 Barrett's esophagus with high grade dysplasia: Secondary | ICD-10-CM | POA: Diagnosis not present

## 2016-09-07 DIAGNOSIS — I2510 Atherosclerotic heart disease of native coronary artery without angina pectoris: Secondary | ICD-10-CM | POA: Diagnosis not present

## 2016-09-07 DIAGNOSIS — K449 Diaphragmatic hernia without obstruction or gangrene: Secondary | ICD-10-CM | POA: Diagnosis not present

## 2016-09-07 DIAGNOSIS — Z955 Presence of coronary angioplasty implant and graft: Secondary | ICD-10-CM | POA: Diagnosis not present

## 2016-09-07 DIAGNOSIS — Z8673 Personal history of transient ischemic attack (TIA), and cerebral infarction without residual deficits: Secondary | ICD-10-CM | POA: Diagnosis not present

## 2016-09-07 DIAGNOSIS — Z09 Encounter for follow-up examination after completed treatment for conditions other than malignant neoplasm: Secondary | ICD-10-CM | POA: Diagnosis not present

## 2016-09-07 DIAGNOSIS — D001 Carcinoma in situ of esophagus: Secondary | ICD-10-CM | POA: Diagnosis not present

## 2016-09-07 DIAGNOSIS — Z880 Allergy status to penicillin: Secondary | ICD-10-CM | POA: Diagnosis not present

## 2016-09-07 DIAGNOSIS — Z87891 Personal history of nicotine dependence: Secondary | ICD-10-CM | POA: Diagnosis not present

## 2016-10-01 ENCOUNTER — Other Ambulatory Visit (INDEPENDENT_AMBULATORY_CARE_PROVIDER_SITE_OTHER): Payer: Self-pay | Admitting: Vascular Surgery

## 2016-10-01 DIAGNOSIS — I672 Cerebral atherosclerosis: Secondary | ICD-10-CM

## 2016-10-02 ENCOUNTER — Ambulatory Visit (INDEPENDENT_AMBULATORY_CARE_PROVIDER_SITE_OTHER): Payer: Medicare Other | Admitting: Vascular Surgery

## 2016-10-02 ENCOUNTER — Encounter (INDEPENDENT_AMBULATORY_CARE_PROVIDER_SITE_OTHER): Payer: Self-pay | Admitting: Vascular Surgery

## 2016-10-02 ENCOUNTER — Ambulatory Visit (INDEPENDENT_AMBULATORY_CARE_PROVIDER_SITE_OTHER): Payer: Medicare Other

## 2016-10-02 VITALS — BP 136/65 | HR 61 | Resp 16 | Ht 66.0 in | Wt 185.0 lb

## 2016-10-02 DIAGNOSIS — E785 Hyperlipidemia, unspecified: Secondary | ICD-10-CM | POA: Diagnosis not present

## 2016-10-02 DIAGNOSIS — I10 Essential (primary) hypertension: Secondary | ICD-10-CM | POA: Diagnosis not present

## 2016-10-02 DIAGNOSIS — K22711 Barrett's esophagus with high grade dysplasia: Secondary | ICD-10-CM

## 2016-10-02 DIAGNOSIS — I129 Hypertensive chronic kidney disease with stage 1 through stage 4 chronic kidney disease, or unspecified chronic kidney disease: Secondary | ICD-10-CM | POA: Diagnosis not present

## 2016-10-02 DIAGNOSIS — I6523 Occlusion and stenosis of bilateral carotid arteries: Secondary | ICD-10-CM

## 2016-10-02 DIAGNOSIS — G459 Transient cerebral ischemic attack, unspecified: Secondary | ICD-10-CM

## 2016-10-02 DIAGNOSIS — N183 Chronic kidney disease, stage 3 (moderate): Secondary | ICD-10-CM | POA: Diagnosis not present

## 2016-10-02 DIAGNOSIS — K2270 Barrett's esophagus without dysplasia: Secondary | ICD-10-CM | POA: Insufficient documentation

## 2016-10-02 DIAGNOSIS — R809 Proteinuria, unspecified: Secondary | ICD-10-CM | POA: Diagnosis not present

## 2016-10-02 DIAGNOSIS — I6529 Occlusion and stenosis of unspecified carotid artery: Secondary | ICD-10-CM | POA: Insufficient documentation

## 2016-10-02 DIAGNOSIS — E119 Type 2 diabetes mellitus without complications: Secondary | ICD-10-CM | POA: Insufficient documentation

## 2016-10-02 DIAGNOSIS — N2581 Secondary hyperparathyroidism of renal origin: Secondary | ICD-10-CM | POA: Diagnosis not present

## 2016-10-02 DIAGNOSIS — I672 Cerebral atherosclerosis: Secondary | ICD-10-CM | POA: Diagnosis not present

## 2016-10-02 DIAGNOSIS — E118 Type 2 diabetes mellitus with unspecified complications: Secondary | ICD-10-CM

## 2016-10-02 NOTE — Progress Notes (Signed)
MRN : [id number]  Mike Decker is a 75 y.o. (1941/08/12) male who presents with chief complaint of  Chief Complaint  Patient presents with  . Follow-up  .  History of Present Illness: Patient returns in follow-up of his carotid disease. He has had no focal neurologic symptoms since his last visit and has only a remote history of a TIA. Specifically, the patient denies amaurosis fugax, speech or swallowing difficulties, or arm or leg weakness or numbness. He has had to have his Plavix held several times for endoscopy since his last visit. His carotid duplex today reveals no progression from his previous studies. He has stable 40-59% right ICA stenosis and stable 1-39% left ICA stenosis.  Current Outpatient Prescriptions  Medication Sig Dispense Refill  . acetaminophen (TYLENOL) 500 MG tablet Take 1,000 mg by mouth every 6 (six) hours as needed for mild pain, moderate pain or headache.    . allopurinol (ZYLOPRIM) 100 MG tablet Take 200 mg by mouth daily.    Marland Kitchen aspirin (ASPIRIN CHILDRENS) 81 MG chewable tablet Chew 1 tablet (81 mg total) by mouth daily. 120 tablet 0  . cetirizine (ZYRTEC) 10 MG tablet Take 5 mg by mouth daily as needed for allergies or rhinitis.     . Cholecalciferol (VITAMIN D3) 50000 UNITS CAPS Take 50,000 Units by mouth every 30 (thirty) days.    . citalopram (CELEXA) 10 MG tablet Take 10 mg by mouth daily.    . cloNIDine (CATAPRES) 0.1 MG tablet Take 1 tablet (0.1 mg total) by mouth 2 (two) times daily. (Patient taking differently: Take 0.1 mg by mouth 2 (two) times daily. Take 0.1 mg tab before breakfast, take 0.1 mg tab x2 nightly) 60 tablet 11  . clopidogrel (PLAVIX) 75 MG tablet Take 1 tablet (75 mg total) by mouth daily. 30 tablet 0  . colchicine 0.6 MG tablet Take 0.6 mg by mouth 3 (three) times daily.    Marland Kitchen dextromethorphan-guaiFENesin (MUCINEX DM) 30-600 MG 12hr tablet Take 1 tablet by mouth 2 (two) times daily as needed for cough.    . EPINEPHrine (EPI-PEN) 0.3  mg/0.3 mL SOAJ injection Inject into the muscle as needed (anaphylaxis).     Marland Kitchen ergocalciferol (VITAMIN D2) 50000 units capsule Take 50,000 Units by mouth every 30 (thirty) days.    . furosemide (LASIX) 40 MG tablet Take 20 mg by mouth 2 (two) times daily.     . insulin aspart (NOVOLOG FLEXPEN) 100 UNIT/ML FlexPen Inject 20-32 Units into the skin 2 (two) times daily before lunch and supper. Take per sliding scale.    . insulin glargine (LANTUS) 100 UNIT/ML injection Inject 85 Units into the skin at bedtime.     . isosorbide mononitrate (IMDUR) 60 MG 24 hr tablet Take 60 mg by mouth daily.    Marland Kitchen lisinopril (PRINIVIL,ZESTRIL) 10 MG tablet Take 10 mg by mouth daily.    . metoprolol (LOPRESSOR) 50 MG tablet Take 50 mg by mouth 2 (two) times daily.    . nitroGLYCERIN (NITROSTAT) 0.4 MG SL tablet Place 0.4 mg under the tongue every 5 (five) minutes x 3 doses as needed for chest pain.     . pantoprazole (PROTONIX) 40 MG tablet Take 40 mg by mouth 2 (two) times daily.    . rosuvastatin (CRESTOR) 40 MG tablet Take 40 mg by mouth daily.    Marland Kitchen umeclidinium-vilanterol (ANORO ELLIPTA) 62.5-25 MCG/INH AEPB Inhale into the lungs.    . isosorbide mononitrate (IMDUR) 120 MG 24 hr tablet  Take 1 tablet (120 mg total) by mouth daily. (Patient not taking: Reported on 10/02/2016) 30 tablet 0  . simvastatin (ZOCOR) 20 MG tablet Take 20 mg by mouth at bedtime.      No current facility-administered medications for this visit.     Past Medical History:  Diagnosis Date  . Arthritis   . Atrioventricular canal (AVC)    irregular heart beats  . Barrett esophagus   . Cancer [facility]) 2002   prostate  . Colon polyp   . Diabetes mellitus without complication ([place])   . Diverticulosis   . Gout   . Heart disease   . Hemangioma    liver  . Hyperlipidemia   . Hypertension   . Myocardial infarct   . Ocular hypertension   . Peripheral vascular disease ([place])   . Skin cancer   . Sleep apnea   . Vitreoretinal degeneration      Past Surgical History:  Procedure Laterality Date  . CATARACT EXTRACTION  2011, 2012  . CORONARY ANGIOPLASTY WITH STENT PLACEMENT  2012  . ESOPHAGOGASTRODUODENOSCOPY (EGD) WITH PROPOFOL N/A 06/01/2016   Procedure: ESOPHAGOGASTRODUODENOSCOPY (EGD) WITH PROPOFOL;  Surgeon: Manya Silvas, MD;  Location: [facility] ENDOSCOPY;  Service: Endoscopy;  Laterality: N/A;  . HERNIA REPAIR     x2  . INTRAOCULAR LENS INSERTION    . NOSE SURGERY     submucous resection  . PROSTATE SURGERY  2002  . ROTATOR CUFF REPAIR Right     Social History Social History  Substance Use Topics  . Smoking status: Former Smoker    Years: 20.00    Types: Cigarettes    Quit date: 12/10/1976  . Smokeless tobacco: Never Used  . Alcohol use No     Family History Family History  Problem Relation Age of Onset  . Stroke Maternal Grandfather      Allergies  Allergen Reactions  . Peanut-Containing Drug Products Anaphylaxis  . Penicillins Hives and Rash    Has patient had a PCN reaction causing immediate rash, facial/tongue/throat swelling, SOB or lightheadedness with hypotension: Yes Has patient had a PCN reaction causing severe rash involving mucus membranes or skin necrosis: No Has patient had a PCN reaction that required hospitalization No Has patient had a PCN reaction occurring within the last 10 years: No If all of the above answers are "NO", then may proceed with Cephalosporin use.  . Bee Venom Swelling  . Influenza Vaccines Hives  . Inh [Isoniazid] Hives  . Kenalog [Triamcinolone Acetonide] Hives  . Nalfon [Fenoprofen Calcium] Hives     REVIEW OF SYSTEMS (Negative unless checked)  Constitutional: [] Weight loss  [] Fever  [] Chills Cardiac: [] Chest pain   [] Chest pressure   [] Palpitations   [] Shortness of breath when laying flat   [] Shortness of breath at rest   [] Shortness of breath with exertion. Vascular:  [] Pain in legs with walking   [] Pain in legs at rest   [] Pain in legs when laying flat    [] Claudication   [] Pain in feet when walking  [] Pain in feet at rest  [] Pain in feet when laying flat   [] History of DVT   [] Phlebitis   [] Swelling in legs   [] Varicose veins   [] Non-healing ulcers Pulmonary:   [] Uses home oxygen   [] Productive cough   [] Hemoptysis   [] Wheeze  [] COPD   [] Asthma Neurologic:  [] Dizziness  [] Blackouts   [] Seizures   [] History of stroke   [x] History of TIA  [] Aphasia   [] Temporary blindness   []   Dysphagia   [] Weakness or numbness in arms   [] Weakness or numbness in legs Musculoskeletal:  [] Arthritis   [] Joint swelling   [] Joint pain   [] Low back pain Hematologic:  [] Easy bruising  [] Easy bleeding   [] Hypercoagulable state   [] Anemic  [] Hepatitis Gastrointestinal:  [] Blood in stool   [] Vomiting blood  [x] Gastroesophageal reflux/heartburn   [x] Difficulty swallowing. Genitourinary:  [] Chronic kidney disease   [] Difficult urination  [] Frequent urination  [] Burning with urination   [] Blood in urine Skin:  [] Rashes   [] Ulcers   [] Wounds Psychological:  [] History of anxiety   []  History of major depression.  Physical Examination  Vitals:   10/02/16 1122 10/02/16 1123  BP: 135/63 136/65  Pulse: 61   Resp: 16   Weight: 185 lb (83.9 kg)   Height: 5\' 6"  (1.676 m)    Body mass index is 29.86 kg/m. Gen:  WD/WN, NAD Head: [place]/AT, No temporalis wasting. Ear/Nose/Throat: Hearing grossly intact, nares w/o erythema or drainage, trachea midline Eyes: Conjunctiva clear. Sclera non-icteric Neck: Supple.  No bruit or JVD.  Pulmonary:  Good air movement, equal and clear to auscultation bilaterally.  Cardiac: RRR, normal S1, S2, no Murmurs, rubs or gallops. Vascular:  Vessel Right Left  Radial Palpable Palpable                                   Gastrointestinal: soft, non-tender/non-distended. No guarding/reflex.  Musculoskeletal: M/S 5/5 throughout.  No deformity or atrophy.  Neurologic: CN 2-12 intact. Sensation grossly intact in extremities.  Symmetrical.  Speech is  fluent. Motor exam as listed above. Psychiatric: Judgment intact, Mood & affect appropriate for pt's clinical situation. Dermatologic: No rashes or ulcers noted.  No cellulitis or open wounds. Lymph : No Cervical, Axillary, or Inguinal lymphadenopathy.     CBC Lab Results  Component Value Date   WBC 9.2 04/27/2016   HGB 12.5 (L) 04/27/2016   HCT 37.4 (L) 04/27/2016   MCV 89.4 04/27/2016   PLT 242 04/27/2016    BMET    Component Value Date/Time   NA 138 04/27/2016 1958   NA 139 11/03/2014 0711   K 4.1 04/27/2016 1958   K 4.6 11/03/2014 0711   CL 105 04/27/2016 1958   CL 104 11/03/2014 0711   CO2 25 04/27/2016 1958   CO2 28 11/03/2014 0711   GLUCOSE 75 04/27/2016 1958   GLUCOSE 234 (H) 11/03/2014 0711   BUN 34 (H) 04/27/2016 1958   BUN 43 (H) 11/03/2014 0711   CREATININE 1.78 (H) 04/27/2016 1958   CREATININE 1.68 (H) 11/03/2014 0711   CALCIUM 9.2 04/27/2016 1958   CALCIUM 8.9 11/03/2014 0711   GFRNONAA 36 (L) 04/27/2016 1958   GFRNONAA 43 (L) 11/03/2014 0711   GFRAA 41 (L) 04/27/2016 1958   GFRAA 52 (L) 11/03/2014 0711   CrCl cannot be calculated (Patient's most recent lab result is older than the maximum 21 days allowed.).  COAG Lab Results  Component Value Date   INR 0.94 10/08/2015    Radiology No results found.    Assessment/Plan TIA (transient ischemic attack) remote  Barrett's esophagus Getting treatment at [facility]. He brings his records and unable to evaluate his endoscopy. He has had to stop his Plavix on several occasions for these procedures, but this has not resulted in worsening stenosis in his carotids thankfully.  Diabetes (Alpha) blood glucose control important in reducing the progression of atherosclerotic disease. Also, involved in  wound healing. On appropriate medications.   Essential hypertension blood pressure control important in reducing the progression of atherosclerotic disease. On appropriate oral  medications.   Hyperlipidemia lipid control important in reducing the progression of atherosclerotic disease. Continue statin therapy   Carotid stenosis His carotid duplex today reveals no progression from his previous studies. He has stable 40-59% right ICA stenosis and stable 1-39% left ICA stenosis. He will continue his current medical regimen with statin therapy and antiplatelet therapy. We will plan to see him back in 6 months with repeat duplex or sooner if problems develop in the interim particularly because his Plavix had to be stopped several times, I would have a low threshold for an earlier study.    Leotis Pain, MD  10/02/2016 12:44 PM    This note was created with Dragon medical transcription system.  Any errors from dictation are purely unintentional

## 2016-10-02 NOTE — Patient Instructions (Signed)
°Carotid Artery Disease °The carotid arteries are the two main arteries on either side of the neck that supply blood to the brain. Carotid artery disease, also called carotid artery stenosis, is the narrowing or blockage of one or both carotid arteries. Carotid artery disease increases your risk for a stroke or a transient ischemic attack (TIA). A TIA is an episode in which a waxy, fatty substance that accumulates within the artery (plaque) blocks blood flow to the brain. A TIA is considered a "warning stroke."  °CAUSES  °· Buildup of plaque inside the carotid arteries (atherosclerosis) (common). °· A weakened outpouching in an artery (aneurysm). °· Inflammation of the carotid artery (arteritis). °· A fibrous growth within the carotid artery (fibromuscular dysplasia). °· Tissue death within the carotid artery due to radiation treatment (post-radiation necrosis). °· Decreased blood flow due to spasms of the carotid artery (vasospasm). °· Separation of the walls of the carotid artery (carotid dissection). °RISK FACTORS °· High cholesterol (dyslipidemia).   °· High blood pressure (hypertension).   °· Smoking.   °· Obesity.   °· Diabetes.   °· Family history of cardiovascular disease.   °· Inactivity or lack of regular exercise.   °· Being male. Men have an increased risk of developing atherosclerosis earlier in life than women.   °SYMPTOMS  °Carotid artery disease does not cause symptoms. °DIAGNOSIS °Diagnosis of carotid artery disease may include:  °· A physical exam. Your health care provider may hear an abnormal sound (bruit) when listening to the carotid arteries.   °· Specific tests that look at the blood flow in the carotid arteries. These tests include:   °¨ Carotid artery ultrasonography.   °¨ Carotid or cerebral angiography.   °¨ Computerized tomographic angiography (CTA).   °¨ Magnetic resonance angiography (MRA).   °TREATMENT  °Treatment of carotid artery disease can include a combination of treatments.  Treatment options include: °· Surgery. You may have:   °¨ A carotid endarterectomy. This is a surgery to remove the blockages in the carotid arteries.   °¨ A carotid angioplasty with stenting. This is a nonsurgical interventional procedure. A wire mesh (stent) is used to widen the blocked carotid arteries.   °· Medicines to control blood pressure, cholesterol, and reduce blood clotting (antiplatelet therapy).   °· Adjusting your diet.   °· Lifestyle changes such as:   °¨ Quitting smoking.   °¨ Exercising as tolerated or as directed by your health care provider.   °¨ Controlling and maintaining a good blood pressure.   °¨ Keeping cholesterol levels under control.   °HOME CARE INSTRUCTIONS  °· Take medicines only as directed by your health care provider. Make sure you understand all your medicine instructions. Do not stop your medicines without talking to your health care provider.   °· Follow your health care provider's diet instructions. It is important to eat a healthy diet that is low in saturated fats and includes plenty of fresh fruits, vegetables, and lean meats. High-fat, high-sodium foods as well as foods that are fried, overly processed, or have poor nutritional value should be avoided. °· Maintain a healthy weight.   °· Stay physically active. It is recommended that you get at least 30 minutes of activity every day.   °· Do not use any tobacco products including cigarettes, chewing tobacco, or electronic cigarettes. If you need help quitting, ask your health care provider. °· Limit alcohol use to:   °¨ No more than 2 drinks per day for men.   °¨ No more than 1 drink per day for nonpregnant women.   °· Do not use illegal drugs.   °· Keep all follow-up visits as directed by your health   care provider.   °SEEK IMMEDIATE MEDICAL CARE IF:  °You develop TIA or stroke symptoms. These include:  °· Sudden weakness or numbness on one side of the body, such as in the face, arm, or leg.   °· Sudden confusion.    °· Trouble speaking (aphasia) or understanding.   °· Sudden trouble seeing out of one or both eyes.   °· Sudden trouble walking.   °· Dizziness or feeling like you might faint.   °· Loss of balance or coordination.   °· Sudden severe headache with no known cause.   °· Sudden trouble swallowing (dysphagia).   °If you have any of these symptoms, call your local emergency services (911 in [place] Do not drive yourself to the clinic or hospital. This is a medical emergency.  °  °This information is not intended to replace advice given to you by your health care provider. Make sure you discuss any questions you have with your health care provider. °  °Document Released: 02/18/2012 Document Revised: 12/17/2014 Document Reviewed: 05/27/2013 °Elsevier Interactive Patient Education ©2016 [employer] ° °

## 2016-10-02 NOTE — Assessment & Plan Note (Signed)
blood pressure control important in reducing the progression of atherosclerotic disease. On appropriate oral medications.  

## 2016-10-02 NOTE — Assessment & Plan Note (Signed)
blood glucose control important in reducing the progression of atherosclerotic disease. Also, involved in wound healing. On appropriate medications.  

## 2016-10-02 NOTE — Assessment & Plan Note (Signed)
lipid control important in reducing the progression of atherosclerotic disease. Continue statin therapy  

## 2016-10-02 NOTE — Assessment & Plan Note (Signed)
His carotid duplex today reveals no progression from his previous studies. He has stable 40-59% right ICA stenosis and stable 1-39% left ICA stenosis. He will continue his current medical regimen with statin therapy and antiplatelet therapy. We will plan to see him back in 6 months with repeat duplex or sooner if problems develop in the interim particularly because his Plavix had to be stopped several times, I would have a low threshold for an earlier study.

## 2016-10-02 NOTE — Assessment & Plan Note (Signed)
Getting treatment at [facility]. He brings his records and unable to evaluate his endoscopy. He has had to stop his Plavix on several occasions for these procedures, but this has not resulted in worsening stenosis in his carotids thankfully.

## 2016-10-02 NOTE — Assessment & Plan Note (Signed)
remote 

## 2016-10-11 DIAGNOSIS — I208 Other forms of angina pectoris: Secondary | ICD-10-CM | POA: Diagnosis not present

## 2016-10-11 DIAGNOSIS — K219 Gastro-esophageal reflux disease without esophagitis: Secondary | ICD-10-CM | POA: Diagnosis not present

## 2016-10-11 DIAGNOSIS — I10 Essential (primary) hypertension: Secondary | ICD-10-CM | POA: Diagnosis not present

## 2016-10-11 DIAGNOSIS — I739 Peripheral vascular disease, unspecified: Secondary | ICD-10-CM | POA: Diagnosis not present

## 2016-10-11 DIAGNOSIS — M1A00X0 Idiopathic chronic gout, unspecified site, without tophus (tophi): Secondary | ICD-10-CM | POA: Diagnosis not present

## 2016-10-11 DIAGNOSIS — E119 Type 2 diabetes mellitus without complications: Secondary | ICD-10-CM | POA: Diagnosis not present

## 2016-10-11 DIAGNOSIS — E669 Obesity, unspecified: Secondary | ICD-10-CM | POA: Diagnosis not present

## 2016-10-11 DIAGNOSIS — N182 Chronic kidney disease, stage 2 (mild): Secondary | ICD-10-CM | POA: Diagnosis not present

## 2016-10-11 DIAGNOSIS — J329 Chronic sinusitis, unspecified: Secondary | ICD-10-CM | POA: Diagnosis not present

## 2016-10-11 DIAGNOSIS — E784 Other hyperlipidemia: Secondary | ICD-10-CM | POA: Diagnosis not present

## 2016-10-11 DIAGNOSIS — I2510 Atherosclerotic heart disease of native coronary artery without angina pectoris: Secondary | ICD-10-CM | POA: Diagnosis not present

## 2016-10-11 DIAGNOSIS — I639 Cerebral infarction, unspecified: Secondary | ICD-10-CM | POA: Diagnosis not present

## 2016-10-16 DIAGNOSIS — E875 Hyperkalemia: Secondary | ICD-10-CM | POA: Diagnosis not present

## 2016-10-16 DIAGNOSIS — E1122 Type 2 diabetes mellitus with diabetic chronic kidney disease: Secondary | ICD-10-CM | POA: Diagnosis not present

## 2016-10-16 DIAGNOSIS — R809 Proteinuria, unspecified: Secondary | ICD-10-CM | POA: Diagnosis not present

## 2016-10-16 DIAGNOSIS — I129 Hypertensive chronic kidney disease with stage 1 through stage 4 chronic kidney disease, or unspecified chronic kidney disease: Secondary | ICD-10-CM | POA: Diagnosis not present

## 2016-10-16 DIAGNOSIS — N183 Chronic kidney disease, stage 3 (moderate): Secondary | ICD-10-CM | POA: Diagnosis not present

## 2016-11-14 DIAGNOSIS — E669 Obesity, unspecified: Secondary | ICD-10-CM | POA: Diagnosis not present

## 2016-11-14 DIAGNOSIS — J449 Chronic obstructive pulmonary disease, unspecified: Secondary | ICD-10-CM | POA: Diagnosis not present

## 2016-11-14 DIAGNOSIS — E1122 Type 2 diabetes mellitus with diabetic chronic kidney disease: Secondary | ICD-10-CM | POA: Diagnosis not present

## 2016-11-14 DIAGNOSIS — I129 Hypertensive chronic kidney disease with stage 1 through stage 4 chronic kidney disease, or unspecified chronic kidney disease: Secondary | ICD-10-CM | POA: Diagnosis not present

## 2016-11-14 DIAGNOSIS — G4733 Obstructive sleep apnea (adult) (pediatric): Secondary | ICD-10-CM | POA: Diagnosis not present

## 2016-11-14 DIAGNOSIS — Z79899 Other long term (current) drug therapy: Secondary | ICD-10-CM | POA: Diagnosis not present

## 2016-11-14 DIAGNOSIS — Z955 Presence of coronary angioplasty implant and graft: Secondary | ICD-10-CM | POA: Diagnosis not present

## 2016-11-14 DIAGNOSIS — K22711 Barrett's esophagus with high grade dysplasia: Secondary | ICD-10-CM | POA: Diagnosis not present

## 2016-11-14 DIAGNOSIS — D001 Carcinoma in situ of esophagus: Secondary | ICD-10-CM | POA: Diagnosis not present

## 2016-11-14 DIAGNOSIS — E7800 Pure hypercholesterolemia, unspecified: Secondary | ICD-10-CM | POA: Diagnosis not present

## 2016-11-14 DIAGNOSIS — Z09 Encounter for follow-up examination after completed treatment for conditions other than malignant neoplasm: Secondary | ICD-10-CM | POA: Diagnosis not present

## 2016-11-14 DIAGNOSIS — Z8673 Personal history of transient ischemic attack (TIA), and cerebral infarction without residual deficits: Secondary | ICD-10-CM | POA: Diagnosis not present

## 2016-11-14 DIAGNOSIS — C159 Malignant neoplasm of esophagus, unspecified: Secondary | ICD-10-CM | POA: Diagnosis not present

## 2016-11-14 DIAGNOSIS — I2510 Atherosclerotic heart disease of native coronary artery without angina pectoris: Secondary | ICD-10-CM | POA: Diagnosis not present

## 2016-11-14 DIAGNOSIS — Z7982 Long term (current) use of aspirin: Secondary | ICD-10-CM | POA: Diagnosis not present

## 2016-11-14 DIAGNOSIS — K449 Diaphragmatic hernia without obstruction or gangrene: Secondary | ICD-10-CM | POA: Diagnosis not present

## 2016-11-14 DIAGNOSIS — Z794 Long term (current) use of insulin: Secondary | ICD-10-CM | POA: Diagnosis not present

## 2016-11-14 DIAGNOSIS — Z8546 Personal history of malignant neoplasm of prostate: Secondary | ICD-10-CM | POA: Diagnosis not present

## 2016-11-14 DIAGNOSIS — C158 Malignant neoplasm of overlapping sites of esophagus: Secondary | ICD-10-CM | POA: Diagnosis not present

## 2016-11-14 DIAGNOSIS — N183 Chronic kidney disease, stage 3 (moderate): Secondary | ICD-10-CM | POA: Diagnosis not present

## 2016-12-25 DIAGNOSIS — N183 Chronic kidney disease, stage 3 (moderate): Secondary | ICD-10-CM | POA: Diagnosis not present

## 2016-12-25 DIAGNOSIS — Z794 Long term (current) use of insulin: Secondary | ICD-10-CM | POA: Diagnosis not present

## 2016-12-25 DIAGNOSIS — E1122 Type 2 diabetes mellitus with diabetic chronic kidney disease: Secondary | ICD-10-CM | POA: Diagnosis not present

## 2017-01-01 DIAGNOSIS — E1151 Type 2 diabetes mellitus with diabetic peripheral angiopathy without gangrene: Secondary | ICD-10-CM | POA: Diagnosis not present

## 2017-01-01 DIAGNOSIS — E1122 Type 2 diabetes mellitus with diabetic chronic kidney disease: Secondary | ICD-10-CM | POA: Diagnosis not present

## 2017-01-01 DIAGNOSIS — I10 Essential (primary) hypertension: Secondary | ICD-10-CM | POA: Diagnosis not present

## 2017-01-01 DIAGNOSIS — E1142 Type 2 diabetes mellitus with diabetic polyneuropathy: Secondary | ICD-10-CM | POA: Diagnosis not present

## 2017-01-01 DIAGNOSIS — N183 Chronic kidney disease, stage 3 (moderate): Secondary | ICD-10-CM | POA: Diagnosis not present

## 2017-01-01 DIAGNOSIS — Z794 Long term (current) use of insulin: Secondary | ICD-10-CM | POA: Diagnosis not present

## 2017-01-01 DIAGNOSIS — E1165 Type 2 diabetes mellitus with hyperglycemia: Secondary | ICD-10-CM | POA: Diagnosis not present

## 2017-01-02 DIAGNOSIS — Z8673 Personal history of transient ischemic attack (TIA), and cerebral infarction without residual deficits: Secondary | ICD-10-CM | POA: Diagnosis not present

## 2017-01-24 DIAGNOSIS — K449 Diaphragmatic hernia without obstruction or gangrene: Secondary | ICD-10-CM | POA: Diagnosis not present

## 2017-01-24 DIAGNOSIS — Z87442 Personal history of urinary calculi: Secondary | ICD-10-CM | POA: Diagnosis not present

## 2017-01-24 DIAGNOSIS — G4733 Obstructive sleep apnea (adult) (pediatric): Secondary | ICD-10-CM | POA: Diagnosis not present

## 2017-01-24 DIAGNOSIS — I129 Hypertensive chronic kidney disease with stage 1 through stage 4 chronic kidney disease, or unspecified chronic kidney disease: Secondary | ICD-10-CM | POA: Diagnosis not present

## 2017-01-24 DIAGNOSIS — Z8501 Personal history of malignant neoplasm of esophagus: Secondary | ICD-10-CM | POA: Diagnosis not present

## 2017-01-24 DIAGNOSIS — I2510 Atherosclerotic heart disease of native coronary artery without angina pectoris: Secondary | ICD-10-CM | POA: Diagnosis not present

## 2017-01-24 DIAGNOSIS — M109 Gout, unspecified: Secondary | ICD-10-CM | POA: Diagnosis not present

## 2017-01-24 DIAGNOSIS — N400 Enlarged prostate without lower urinary tract symptoms: Secondary | ICD-10-CM | POA: Diagnosis not present

## 2017-01-24 DIAGNOSIS — Z8546 Personal history of malignant neoplasm of prostate: Secondary | ICD-10-CM | POA: Diagnosis not present

## 2017-01-24 DIAGNOSIS — N183 Chronic kidney disease, stage 3 (moderate): Secondary | ICD-10-CM | POA: Diagnosis not present

## 2017-01-24 DIAGNOSIS — D001 Carcinoma in situ of esophagus: Secondary | ICD-10-CM | POA: Diagnosis not present

## 2017-01-24 DIAGNOSIS — E7800 Pure hypercholesterolemia, unspecified: Secondary | ICD-10-CM | POA: Diagnosis not present

## 2017-01-24 DIAGNOSIS — Q402 Other specified congenital malformations of stomach: Secondary | ICD-10-CM | POA: Diagnosis not present

## 2017-01-24 DIAGNOSIS — Z794 Long term (current) use of insulin: Secondary | ICD-10-CM | POA: Diagnosis not present

## 2017-01-24 DIAGNOSIS — E1122 Type 2 diabetes mellitus with diabetic chronic kidney disease: Secondary | ICD-10-CM | POA: Diagnosis not present

## 2017-01-24 DIAGNOSIS — Z7982 Long term (current) use of aspirin: Secondary | ICD-10-CM | POA: Diagnosis not present

## 2017-01-24 DIAGNOSIS — K22711 Barrett's esophagus with high grade dysplasia: Secondary | ICD-10-CM | POA: Diagnosis not present

## 2017-01-24 DIAGNOSIS — E669 Obesity, unspecified: Secondary | ICD-10-CM | POA: Diagnosis not present

## 2017-01-24 DIAGNOSIS — Z8673 Personal history of transient ischemic attack (TIA), and cerebral infarction without residual deficits: Secondary | ICD-10-CM | POA: Diagnosis not present

## 2017-01-24 DIAGNOSIS — K228 Other specified diseases of esophagus: Secondary | ICD-10-CM | POA: Diagnosis not present

## 2017-01-24 DIAGNOSIS — J449 Chronic obstructive pulmonary disease, unspecified: Secondary | ICD-10-CM | POA: Diagnosis not present

## 2017-02-08 DIAGNOSIS — I2510 Atherosclerotic heart disease of native coronary artery without angina pectoris: Secondary | ICD-10-CM | POA: Diagnosis not present

## 2017-02-08 DIAGNOSIS — J329 Chronic sinusitis, unspecified: Secondary | ICD-10-CM | POA: Diagnosis not present

## 2017-02-08 DIAGNOSIS — I739 Peripheral vascular disease, unspecified: Secondary | ICD-10-CM | POA: Diagnosis not present

## 2017-02-08 DIAGNOSIS — K219 Gastro-esophageal reflux disease without esophagitis: Secondary | ICD-10-CM | POA: Diagnosis not present

## 2017-02-08 DIAGNOSIS — I10 Essential (primary) hypertension: Secondary | ICD-10-CM | POA: Diagnosis not present

## 2017-02-08 DIAGNOSIS — E784 Other hyperlipidemia: Secondary | ICD-10-CM | POA: Diagnosis not present

## 2017-02-08 DIAGNOSIS — I639 Cerebral infarction, unspecified: Secondary | ICD-10-CM | POA: Diagnosis not present

## 2017-02-08 DIAGNOSIS — E669 Obesity, unspecified: Secondary | ICD-10-CM | POA: Diagnosis not present

## 2017-02-08 DIAGNOSIS — N182 Chronic kidney disease, stage 2 (mild): Secondary | ICD-10-CM | POA: Diagnosis not present

## 2017-02-08 DIAGNOSIS — E119 Type 2 diabetes mellitus without complications: Secondary | ICD-10-CM | POA: Diagnosis not present

## 2017-02-08 DIAGNOSIS — R001 Bradycardia, unspecified: Secondary | ICD-10-CM | POA: Diagnosis not present

## 2017-02-08 DIAGNOSIS — I208 Other forms of angina pectoris: Secondary | ICD-10-CM | POA: Diagnosis not present

## 2017-02-14 DIAGNOSIS — E1165 Type 2 diabetes mellitus with hyperglycemia: Secondary | ICD-10-CM | POA: Diagnosis not present

## 2017-02-14 DIAGNOSIS — N184 Chronic kidney disease, stage 4 (severe): Secondary | ICD-10-CM | POA: Diagnosis not present

## 2017-02-14 DIAGNOSIS — E1151 Type 2 diabetes mellitus with diabetic peripheral angiopathy without gangrene: Secondary | ICD-10-CM | POA: Diagnosis not present

## 2017-02-14 DIAGNOSIS — E1122 Type 2 diabetes mellitus with diabetic chronic kidney disease: Secondary | ICD-10-CM | POA: Diagnosis not present

## 2017-02-14 DIAGNOSIS — Z794 Long term (current) use of insulin: Secondary | ICD-10-CM | POA: Diagnosis not present

## 2017-02-14 DIAGNOSIS — E1142 Type 2 diabetes mellitus with diabetic polyneuropathy: Secondary | ICD-10-CM | POA: Diagnosis not present

## 2017-03-21 DIAGNOSIS — H26493 Other secondary cataract, bilateral: Secondary | ICD-10-CM | POA: Diagnosis not present

## 2017-03-21 DIAGNOSIS — Z961 Presence of intraocular lens: Secondary | ICD-10-CM | POA: Diagnosis not present

## 2017-03-27 DIAGNOSIS — K2270 Barrett's esophagus without dysplasia: Secondary | ICD-10-CM | POA: Diagnosis not present

## 2017-03-27 DIAGNOSIS — Z8546 Personal history of malignant neoplasm of prostate: Secondary | ICD-10-CM | POA: Diagnosis not present

## 2017-03-27 DIAGNOSIS — Z794 Long term (current) use of insulin: Secondary | ICD-10-CM | POA: Diagnosis not present

## 2017-03-27 DIAGNOSIS — D001 Carcinoma in situ of esophagus: Secondary | ICD-10-CM | POA: Diagnosis not present

## 2017-03-27 DIAGNOSIS — E1122 Type 2 diabetes mellitus with diabetic chronic kidney disease: Secondary | ICD-10-CM | POA: Diagnosis not present

## 2017-03-27 DIAGNOSIS — K3189 Other diseases of stomach and duodenum: Secondary | ICD-10-CM | POA: Diagnosis not present

## 2017-03-27 DIAGNOSIS — I129 Hypertensive chronic kidney disease with stage 1 through stage 4 chronic kidney disease, or unspecified chronic kidney disease: Secondary | ICD-10-CM | POA: Diagnosis not present

## 2017-03-27 DIAGNOSIS — Z09 Encounter for follow-up examination after completed treatment for conditions other than malignant neoplasm: Secondary | ICD-10-CM | POA: Diagnosis not present

## 2017-03-27 DIAGNOSIS — N183 Chronic kidney disease, stage 3 (moderate): Secondary | ICD-10-CM | POA: Diagnosis not present

## 2017-03-27 DIAGNOSIS — K449 Diaphragmatic hernia without obstruction or gangrene: Secondary | ICD-10-CM | POA: Diagnosis not present

## 2017-03-27 DIAGNOSIS — K228 Other specified diseases of esophagus: Secondary | ICD-10-CM | POA: Diagnosis not present

## 2017-03-27 DIAGNOSIS — K22711 Barrett's esophagus with high grade dysplasia: Secondary | ICD-10-CM | POA: Diagnosis not present

## 2017-03-27 DIAGNOSIS — Z7982 Long term (current) use of aspirin: Secondary | ICD-10-CM | POA: Diagnosis not present

## 2017-04-02 ENCOUNTER — Ambulatory Visit (INDEPENDENT_AMBULATORY_CARE_PROVIDER_SITE_OTHER): Payer: Medicare Other | Admitting: Vascular Surgery

## 2017-04-02 ENCOUNTER — Encounter (INDEPENDENT_AMBULATORY_CARE_PROVIDER_SITE_OTHER): Payer: Medicare Other

## 2017-04-11 DIAGNOSIS — N2581 Secondary hyperparathyroidism of renal origin: Secondary | ICD-10-CM | POA: Diagnosis not present

## 2017-04-11 DIAGNOSIS — N183 Chronic kidney disease, stage 3 (moderate): Secondary | ICD-10-CM | POA: Diagnosis not present

## 2017-04-11 DIAGNOSIS — E1122 Type 2 diabetes mellitus with diabetic chronic kidney disease: Secondary | ICD-10-CM | POA: Diagnosis not present

## 2017-04-11 DIAGNOSIS — I129 Hypertensive chronic kidney disease with stage 1 through stage 4 chronic kidney disease, or unspecified chronic kidney disease: Secondary | ICD-10-CM | POA: Diagnosis not present

## 2017-04-11 DIAGNOSIS — R809 Proteinuria, unspecified: Secondary | ICD-10-CM | POA: Diagnosis not present

## 2017-04-16 DIAGNOSIS — R809 Proteinuria, unspecified: Secondary | ICD-10-CM | POA: Diagnosis not present

## 2017-04-16 DIAGNOSIS — E875 Hyperkalemia: Secondary | ICD-10-CM | POA: Diagnosis not present

## 2017-04-16 DIAGNOSIS — E1122 Type 2 diabetes mellitus with diabetic chronic kidney disease: Secondary | ICD-10-CM | POA: Diagnosis not present

## 2017-04-16 DIAGNOSIS — E872 Acidosis: Secondary | ICD-10-CM | POA: Diagnosis not present

## 2017-04-16 DIAGNOSIS — I129 Hypertensive chronic kidney disease with stage 1 through stage 4 chronic kidney disease, or unspecified chronic kidney disease: Secondary | ICD-10-CM | POA: Diagnosis not present

## 2017-04-16 DIAGNOSIS — N2581 Secondary hyperparathyroidism of renal origin: Secondary | ICD-10-CM | POA: Diagnosis not present

## 2017-04-16 DIAGNOSIS — N183 Chronic kidney disease, stage 3 (moderate): Secondary | ICD-10-CM | POA: Diagnosis not present

## 2017-04-29 DIAGNOSIS — R809 Proteinuria, unspecified: Secondary | ICD-10-CM | POA: Diagnosis not present

## 2017-04-29 DIAGNOSIS — N183 Chronic kidney disease, stage 3 (moderate): Secondary | ICD-10-CM | POA: Diagnosis not present

## 2017-04-29 DIAGNOSIS — I129 Hypertensive chronic kidney disease with stage 1 through stage 4 chronic kidney disease, or unspecified chronic kidney disease: Secondary | ICD-10-CM | POA: Diagnosis not present

## 2017-04-29 DIAGNOSIS — N2581 Secondary hyperparathyroidism of renal origin: Secondary | ICD-10-CM | POA: Diagnosis not present

## 2017-04-29 DIAGNOSIS — E1122 Type 2 diabetes mellitus with diabetic chronic kidney disease: Secondary | ICD-10-CM | POA: Diagnosis not present

## 2017-05-01 ENCOUNTER — Ambulatory Visit (INDEPENDENT_AMBULATORY_CARE_PROVIDER_SITE_OTHER): Payer: Medicare Other

## 2017-05-01 ENCOUNTER — Ambulatory Visit (INDEPENDENT_AMBULATORY_CARE_PROVIDER_SITE_OTHER): Payer: Medicare Other | Admitting: Vascular Surgery

## 2017-05-01 ENCOUNTER — Other Ambulatory Visit (INDEPENDENT_AMBULATORY_CARE_PROVIDER_SITE_OTHER): Payer: Self-pay | Admitting: Vascular Surgery

## 2017-05-01 ENCOUNTER — Encounter (INDEPENDENT_AMBULATORY_CARE_PROVIDER_SITE_OTHER): Payer: Self-pay | Admitting: Vascular Surgery

## 2017-05-01 VITALS — BP 150/66 | HR 67 | Resp 16 | Wt 194.0 lb

## 2017-05-01 DIAGNOSIS — I739 Peripheral vascular disease, unspecified: Secondary | ICD-10-CM

## 2017-05-01 DIAGNOSIS — I6523 Occlusion and stenosis of bilateral carotid arteries: Secondary | ICD-10-CM

## 2017-05-01 DIAGNOSIS — E118 Type 2 diabetes mellitus with unspecified complications: Secondary | ICD-10-CM

## 2017-05-01 DIAGNOSIS — E785 Hyperlipidemia, unspecified: Secondary | ICD-10-CM | POA: Diagnosis not present

## 2017-05-01 NOTE — Progress Notes (Signed)
Subjective:    Patient ID: Mike Decker, male    DOB: 02-19-1941, 76 y.o.   MRN: [id number] Chief Complaint  Patient presents with  . Follow-up   Patient presents to review studies for carotid stenosis and PAD. Last seen on 10/02/16. The patient underwent a bilateral carotid duplex scan which showed no change from the previous exam on 10/02/16. Duplex is stable at Right ICA stenosis (40-59%) and Left ICA stenosis (1-39%). The patient denies experiencing Amaurosis Fugax, TIA like symptoms or focal motor deficits. The patient underwent an ABI which showed no significant lower extremity arterial diease. The patient denies any claudication like symptoms, rest pain or ulcers to his feet / toes.    Review of Systems  Constitutional: Negative.   HENT: Negative.   Eyes: Negative.   Respiratory: Negative.   Cardiovascular: Negative.   Gastrointestinal: Negative.   Endocrine: Negative.   Genitourinary: Negative.   Musculoskeletal: Negative.   Skin: Negative.   Allergic/Immunologic: Negative.   Neurological: Negative.   Hematological: Negative.   Psychiatric/Behavioral: Negative.       Objective:   Physical Exam  Constitutional: He is oriented to person, place, and time. He appears well-developed and well-nourished. No distress.  HENT:  Head: Normocephalic and atraumatic.  Eyes: Conjunctivae are normal. Pupils are equal, round, and reactive to light.  Neck: Normal range of motion.  Cardiovascular: Normal rate, regular rhythm, normal heart sounds and intact distal pulses.   Pulses:      Radial pulses are 2+ on the right side, and 2+ on the left side.       Dorsalis pedis pulses are 2+ on the right side, and 2+ on the left side.       Posterior tibial pulses are 2+ on the right side, and 2+ on the left side.  Pulmonary/Chest: Effort normal.  Musculoskeletal: Normal range of motion. He exhibits no edema.  Neurological: He is alert and oriented to person, place, and time.  Skin: Skin  is warm and dry. He is not diaphoretic.  Psychiatric: He has a normal mood and affect. His behavior is normal. Judgment and thought content normal.  Vitals reviewed.  BP (!) 150/66 (BP Location: Left Arm)   Pulse 67   Resp 16   Wt 194 lb (88 kg)   BMI 31.31 kg/m   Past Medical History:  Diagnosis Date  . Arthritis   . Atrioventricular canal (AVC)    irregular heart beats  . Barrett esophagus   . Cancer [facility]) 2002   prostate  . Colon polyp   . Diabetes mellitus without complication ([place])   . Diverticulosis   . Gout   . Heart disease   . Hemangioma    liver  . Hyperlipidemia   . Hypertension   . Myocardial infarct ([place])   . Ocular hypertension   . Peripheral vascular disease ([place])   . Skin cancer   . Sleep apnea   . Vitreoretinal degeneration    Social History   Social History  . Marital status: Married    Spouse name: N/A  . Number of children: N/A  . Years of education: N/A   Occupational History  . Not on file.   Social History Main Topics  . Smoking status: Former Smoker    Years: 20.00    Types: Cigarettes    Quit date: 12/10/1976  . Smokeless tobacco: Never Used  . Alcohol use No  . Drug use: No  . Sexual activity: Not on file  Other Topics Concern  . Not on file   Social History Narrative  . No narrative on file   Past Surgical History:  Procedure Laterality Date  . CATARACT EXTRACTION  2011, 2012  . CORONARY ANGIOPLASTY WITH STENT PLACEMENT  2012  . ESOPHAGOGASTRODUODENOSCOPY (EGD) WITH PROPOFOL N/A 06/01/2016   Procedure: ESOPHAGOGASTRODUODENOSCOPY (EGD) WITH PROPOFOL;  Surgeon: Manya Silvas, MD;  Location: [facility] ENDOSCOPY;  Service: Endoscopy;  Laterality: N/A;  . HERNIA REPAIR     x2  . INTRAOCULAR LENS INSERTION    . NOSE SURGERY     submucous resection  . PROSTATE SURGERY  2002  . ROTATOR CUFF REPAIR Right    Family History  Problem Relation Age of Onset  . Stroke Maternal Grandfather    Allergies  Allergen Reactions  .  Peanut-Containing Drug Products Anaphylaxis  . Penicillins Hives and Rash    Has patient had a PCN reaction causing immediate rash, facial/tongue/throat swelling, SOB or lightheadedness with hypotension: Yes Has patient had a PCN reaction causing severe rash involving mucus membranes or skin necrosis: No Has patient had a PCN reaction that required hospitalization No Has patient had a PCN reaction occurring within the last 10 years: No If all of the above answers are "NO", then may proceed with Cephalosporin use.  . Bee Venom Swelling  . Influenza Vaccines Hives  . Inh [Isoniazid] Hives  . Kenalog [Triamcinolone Acetonide] Hives  . Nalfon [Fenoprofen Calcium] Hives      Assessment & Plan:  Patient presents to review studies for carotid stenosis and PAD. Last seen on 10/02/16. The patient underwent a bilateral carotid duplex scan which showed no change from the previous exam on 10/02/16. Duplex is stable at Right ICA stenosis (40-59%) and Left ICA stenosis (1-39%). The patient denies experiencing Amaurosis Fugax, TIA like symptoms or focal motor deficits. The patient underwent an ABI which showed no significant lower extremity arterial diease. The patient denies any claudication like symptoms, rest pain or ulcers to his feet / toes.   1. Bilateral carotid artery stenosis - Stable Studies reviewed with patient. Patient asymptomatic with stable duplex.  No intervention at this time. Patient to return in one year for surveillance carotid duplex. Patient to continue medical optimization with ASA and dyslipidemia medication. Patient to remain abstinent of tobacco use. I have discussed with the patient at length the risk factors for and pathogenesis of atherosclerotic disease and encouraged a healthy diet, regular exercise regimen and blood pressure / glucose control.  Patient was instructed to contact our office in the interim with problems such as arm / leg weakness or numbness, speech /  swallowing difficulty or temporary monocular blindness. The patient expresses their understanding.  - VAS US CAROTID; Future  2. PAD (peripheral artery disease) (HCC) - Stable Studies reviewed with patient. Asymptomatic. No significant disease on ABI.  Patient to follow up in XXX. Patient to continue medical optimization with ASA and dyslipidemia medication. Patient to remain abstinent of tobacco use. I have discussed with the patient at length the risk factors for and pathogenesis of atherosclerotic disease and encouraged a healthy diet, regular exercise regimen and blood pressure / glucose control.  The patient was encouraged to call the office in the interim if he experiences any claudication like symptoms, rest pain or ulcers to his feet / toes.  - VAS [place] ABI WITH/WO TBI; Future  3. Type 2 diabetes mellitus with complication, unspecified whether long term insulin use (HCC) - Stable Encouraged good control as its  slows the progression of atherosclerotic disease  4. Hyperlipidemia, unspecified hyperlipidemia type - Stable Encouraged good control as its slows the progression of atherosclerotic disease  Current Outpatient Prescriptions on File Prior to Visit  Medication Sig Dispense Refill  . acetaminophen (TYLENOL) 500 MG tablet Take 1,000 mg by mouth every 6 (six) hours as needed for mild pain, moderate pain or headache.    . allopurinol (ZYLOPRIM) 100 MG tablet Take 200 mg by mouth daily.    Marland Kitchen aspirin (ASPIRIN CHILDRENS) 81 MG chewable tablet Chew 1 tablet (81 mg total) by mouth daily. 120 tablet 0  . cetirizine (ZYRTEC) 10 MG tablet Take 5 mg by mouth daily as needed for allergies or rhinitis.     . Cholecalciferol (VITAMIN D3) 50000 UNITS CAPS Take 50,000 Units by mouth every 30 (thirty) days.    . citalopram (CELEXA) 10 MG tablet Take 10 mg by mouth daily.    . cloNIDine (CATAPRES) 0.1 MG tablet Take 1 tablet (0.1 mg total) by mouth 2 (two) times daily. (Patient taking  differently: Take 0.1 mg by mouth 2 (two) times daily. Take 0.1 mg tab before breakfast, take 0.1 mg tab x2 nightly) 60 tablet 11  . clopidogrel (PLAVIX) 75 MG tablet Take 1 tablet (75 mg total) by mouth daily. 30 tablet 0  . colchicine 0.6 MG tablet Take 0.6 mg by mouth 3 (three) times daily.    Marland Kitchen dextromethorphan-guaiFENesin (MUCINEX DM) 30-600 MG 12hr tablet Take 1 tablet by mouth 2 (two) times daily as needed for cough.    . EPINEPHrine (EPI-PEN) 0.3 mg/0.3 mL SOAJ injection Inject into the muscle as needed (anaphylaxis).     . furosemide (LASIX) 40 MG tablet Take 20 mg by mouth 2 (two) times daily.     . insulin aspart (NOVOLOG FLEXPEN) 100 UNIT/ML FlexPen Inject 20-32 Units into the skin 2 (two) times daily before lunch and supper. Take per sliding scale.    . insulin glargine (LANTUS) 100 UNIT/ML injection Inject 85 Units into the skin at bedtime.     . isosorbide mononitrate (IMDUR) 60 MG 24 hr tablet Take 60 mg by mouth daily.    Marland Kitchen lisinopril (PRINIVIL,ZESTRIL) 10 MG tablet Take 10 mg by mouth daily.    . metoprolol (LOPRESSOR) 50 MG tablet Take 25 mg by mouth 2 (two) times daily.     . nitroGLYCERIN (NITROSTAT) 0.4 MG SL tablet Place 0.4 mg under the tongue every 5 (five) minutes x 3 doses as needed for chest pain.     . pantoprazole (PROTONIX) 40 MG tablet Take 40 mg by mouth 2 (two) times daily.    . rosuvastatin (CRESTOR) 40 MG tablet Take 40 mg by mouth daily.    . ergocalciferol (VITAMIN D2) 50000 units capsule Take 50,000 Units by mouth every 30 (thirty) days.    . isosorbide mononitrate (IMDUR) 120 MG 24 hr tablet Take 1 tablet (120 mg total) by mouth daily. (Patient not taking: Reported on 05/01/2017) 30 tablet 0  . simvastatin (ZOCOR) 20 MG tablet Take 20 mg by mouth at bedtime.     Marland Kitchen umeclidinium-vilanterol (ANORO ELLIPTA) 62.5-25 MCG/INH AEPB Inhale into the lungs.     No current facility-administered medications on file prior to visit.     There are no Patient Instructions  on file for this visit. No Follow-up on file.   Amadu Schlageter A Jendayi Berling, PA-C

## 2017-05-02 DIAGNOSIS — E1165 Type 2 diabetes mellitus with hyperglycemia: Secondary | ICD-10-CM | POA: Diagnosis not present

## 2017-05-02 DIAGNOSIS — Z794 Long term (current) use of insulin: Secondary | ICD-10-CM | POA: Diagnosis not present

## 2017-05-06 ENCOUNTER — Emergency Department: Payer: Medicare Other

## 2017-05-06 ENCOUNTER — Encounter: Payer: Self-pay | Admitting: *Deleted

## 2017-05-06 ENCOUNTER — Inpatient Hospital Stay
Admission: EM | Admit: 2017-05-06 | Discharge: 2017-05-15 | DRG: 280 | Disposition: A | Discharge status: Home Health | Payer: Medicare Other | Attending: Internal Medicine | Admitting: Internal Medicine

## 2017-05-06 DIAGNOSIS — J9601 Acute respiratory failure with hypoxia: Secondary | ICD-10-CM | POA: Diagnosis not present

## 2017-05-06 DIAGNOSIS — E119 Type 2 diabetes mellitus without complications: Secondary | ICD-10-CM | POA: Diagnosis not present

## 2017-05-06 DIAGNOSIS — I700 Atherosclerosis of aorta: Secondary | ICD-10-CM | POA: Diagnosis present

## 2017-05-06 DIAGNOSIS — J441 Chronic obstructive pulmonary disease with (acute) exacerbation: Secondary | ICD-10-CM | POA: Diagnosis present

## 2017-05-06 DIAGNOSIS — I481 Persistent atrial fibrillation: Secondary | ICD-10-CM | POA: Diagnosis not present

## 2017-05-06 DIAGNOSIS — Z7902 Long term (current) use of antithrombotics/antiplatelets: Secondary | ICD-10-CM

## 2017-05-06 DIAGNOSIS — R1032 Left lower quadrant pain: Secondary | ICD-10-CM | POA: Diagnosis not present

## 2017-05-06 DIAGNOSIS — R0602 Shortness of breath: Secondary | ICD-10-CM | POA: Diagnosis not present

## 2017-05-06 DIAGNOSIS — K219 Gastro-esophageal reflux disease without esophagitis: Secondary | ICD-10-CM | POA: Diagnosis present

## 2017-05-06 DIAGNOSIS — I4891 Unspecified atrial fibrillation: Secondary | ICD-10-CM | POA: Diagnosis not present

## 2017-05-06 DIAGNOSIS — A020 Salmonella enteritis: Secondary | ICD-10-CM | POA: Diagnosis present

## 2017-05-06 DIAGNOSIS — Z8673 Personal history of transient ischemic attack (TIA), and cerebral infarction without residual deficits: Secondary | ICD-10-CM

## 2017-05-06 DIAGNOSIS — I5033 Acute on chronic diastolic (congestive) heart failure: Secondary | ICD-10-CM | POA: Diagnosis present

## 2017-05-06 DIAGNOSIS — R0600 Dyspnea, unspecified: Secondary | ICD-10-CM | POA: Diagnosis not present

## 2017-05-06 DIAGNOSIS — Z823 Family history of stroke: Secondary | ICD-10-CM

## 2017-05-06 DIAGNOSIS — E872 Acidosis: Secondary | ICD-10-CM | POA: Diagnosis present

## 2017-05-06 DIAGNOSIS — R042 Hemoptysis: Secondary | ICD-10-CM | POA: Diagnosis present

## 2017-05-06 DIAGNOSIS — I214 Non-ST elevation (NSTEMI) myocardial infarction: Secondary | ICD-10-CM | POA: Diagnosis not present

## 2017-05-06 DIAGNOSIS — K4090 Unilateral inguinal hernia, without obstruction or gangrene, not specified as recurrent: Secondary | ICD-10-CM | POA: Diagnosis present

## 2017-05-06 DIAGNOSIS — Z880 Allergy status to penicillin: Secondary | ICD-10-CM

## 2017-05-06 DIAGNOSIS — N183 Chronic kidney disease, stage 3 (moderate): Secondary | ICD-10-CM | POA: Diagnosis present

## 2017-05-06 DIAGNOSIS — R109 Unspecified abdominal pain: Secondary | ICD-10-CM | POA: Diagnosis not present

## 2017-05-06 DIAGNOSIS — R509 Fever, unspecified: Secondary | ICD-10-CM

## 2017-05-06 DIAGNOSIS — J9600 Acute respiratory failure, unspecified whether with hypoxia or hypercapnia: Secondary | ICD-10-CM

## 2017-05-06 DIAGNOSIS — E1151 Type 2 diabetes mellitus with diabetic peripheral angiopathy without gangrene: Secondary | ICD-10-CM | POA: Diagnosis present

## 2017-05-06 DIAGNOSIS — I252 Old myocardial infarction: Secondary | ICD-10-CM | POA: Diagnosis not present

## 2017-05-06 DIAGNOSIS — E1165 Type 2 diabetes mellitus with hyperglycemia: Secondary | ICD-10-CM | POA: Diagnosis present

## 2017-05-06 DIAGNOSIS — K2270 Barrett's esophagus without dysplasia: Secondary | ICD-10-CM | POA: Diagnosis present

## 2017-05-06 DIAGNOSIS — N179 Acute kidney failure, unspecified: Secondary | ICD-10-CM | POA: Diagnosis present

## 2017-05-06 DIAGNOSIS — J9621 Acute and chronic respiratory failure with hypoxia: Secondary | ICD-10-CM | POA: Diagnosis present

## 2017-05-06 DIAGNOSIS — I130 Hypertensive heart and chronic kidney disease with heart failure and stage 1 through stage 4 chronic kidney disease, or unspecified chronic kidney disease: Secondary | ICD-10-CM | POA: Diagnosis present

## 2017-05-06 DIAGNOSIS — I714 Abdominal aortic aneurysm, without rupture: Secondary | ICD-10-CM | POA: Diagnosis present

## 2017-05-06 DIAGNOSIS — Z91030 Bee allergy status: Secondary | ICD-10-CM

## 2017-05-06 DIAGNOSIS — F329 Major depressive disorder, single episode, unspecified: Secondary | ICD-10-CM | POA: Diagnosis present

## 2017-05-06 DIAGNOSIS — M109 Gout, unspecified: Secondary | ICD-10-CM | POA: Diagnosis present

## 2017-05-06 DIAGNOSIS — Z955 Presence of coronary angioplasty implant and graft: Secondary | ICD-10-CM

## 2017-05-06 DIAGNOSIS — Z794 Long term (current) use of insulin: Secondary | ICD-10-CM

## 2017-05-06 DIAGNOSIS — E1122 Type 2 diabetes mellitus with diabetic chronic kidney disease: Secondary | ICD-10-CM | POA: Diagnosis not present

## 2017-05-06 DIAGNOSIS — Z887 Allergy status to serum and vaccine status: Secondary | ICD-10-CM

## 2017-05-06 DIAGNOSIS — K5790 Diverticulosis of intestine, part unspecified, without perforation or abscess without bleeding: Secondary | ICD-10-CM | POA: Diagnosis present

## 2017-05-06 DIAGNOSIS — Z825 Family history of asthma and other chronic lower respiratory diseases: Secondary | ICD-10-CM

## 2017-05-06 DIAGNOSIS — I501 Left ventricular failure: Secondary | ICD-10-CM | POA: Diagnosis not present

## 2017-05-06 DIAGNOSIS — R079 Chest pain, unspecified: Secondary | ICD-10-CM | POA: Diagnosis not present

## 2017-05-06 DIAGNOSIS — J811 Chronic pulmonary edema: Secondary | ICD-10-CM | POA: Diagnosis not present

## 2017-05-06 DIAGNOSIS — Z79899 Other long term (current) drug therapy: Secondary | ICD-10-CM

## 2017-05-06 DIAGNOSIS — R1012 Left upper quadrant pain: Secondary | ICD-10-CM | POA: Diagnosis not present

## 2017-05-06 DIAGNOSIS — I2510 Atherosclerotic heart disease of native coronary artery without angina pectoris: Secondary | ICD-10-CM | POA: Diagnosis present

## 2017-05-06 DIAGNOSIS — R0682 Tachypnea, not elsewhere classified: Secondary | ICD-10-CM | POA: Diagnosis not present

## 2017-05-06 DIAGNOSIS — E785 Hyperlipidemia, unspecified: Secondary | ICD-10-CM | POA: Diagnosis present

## 2017-05-06 DIAGNOSIS — Z95820 Peripheral vascular angioplasty status with implants and grafts: Secondary | ICD-10-CM | POA: Diagnosis not present

## 2017-05-06 DIAGNOSIS — Z8501 Personal history of malignant neoplasm of esophagus: Secondary | ICD-10-CM

## 2017-05-06 DIAGNOSIS — G4733 Obstructive sleep apnea (adult) (pediatric): Secondary | ICD-10-CM | POA: Diagnosis present

## 2017-05-06 DIAGNOSIS — Z7982 Long term (current) use of aspirin: Secondary | ICD-10-CM

## 2017-05-06 DIAGNOSIS — Z888 Allergy status to other drugs, medicaments and biological substances status: Secondary | ICD-10-CM

## 2017-05-06 DIAGNOSIS — Z833 Family history of diabetes mellitus: Secondary | ICD-10-CM

## 2017-05-06 DIAGNOSIS — R918 Other nonspecific abnormal finding of lung field: Secondary | ICD-10-CM | POA: Diagnosis not present

## 2017-05-06 DIAGNOSIS — Z8546 Personal history of malignant neoplasm of prostate: Secondary | ICD-10-CM

## 2017-05-06 DIAGNOSIS — Z87891 Personal history of nicotine dependence: Secondary | ICD-10-CM

## 2017-05-06 DIAGNOSIS — Z8279 Family history of other congenital malformations, deformations and chromosomal abnormalities: Secondary | ICD-10-CM

## 2017-05-06 DIAGNOSIS — J810 Acute pulmonary edema: Secondary | ICD-10-CM | POA: Diagnosis not present

## 2017-05-06 LAB — URINALYSIS, ROUTINE W REFLEX MICROSCOPIC
BILIRUBIN URINE: NEGATIVE
Bacteria, UA: NONE SEEN
Glucose, UA: NEGATIVE mg/dL
KETONES UR: 5 mg/dL — AB
LEUKOCYTES UA: NEGATIVE
Nitrite: NEGATIVE
PH: 5 (ref 5.0–8.0)
Protein, ur: 100 mg/dL — AB
Specific Gravity, Urine: 1.012 (ref 1.005–1.030)
Squamous Epithelial / LPF: NONE SEEN
WBC, UA: NONE SEEN WBC/hpf (ref 0–5)

## 2017-05-06 LAB — COMPREHENSIVE METABOLIC PANEL
ALBUMIN: 3.8 g/dL (ref 3.5–5.0)
ALK PHOS: 69 U/L (ref 38–126)
ALT: 13 U/L — ABNORMAL LOW (ref 17–63)
ANION GAP: 12 (ref 5–15)
AST: 24 U/L (ref 15–41)
BUN: 37 mg/dL — AB (ref 6–20)
CALCIUM: 8.4 mg/dL — AB (ref 8.9–10.3)
CO2: 22 mmol/L (ref 22–32)
Chloride: 101 mmol/L (ref 101–111)
Creatinine, Ser: 1.99 mg/dL — ABNORMAL HIGH (ref 0.61–1.24)
GFR calc Af Amer: 36 mL/min — ABNORMAL LOW (ref >60)
GFR, EST NON AFRICAN AMERICAN: 31 mL/min — AB (ref >60)
GLUCOSE: 120 mg/dL — AB (ref 65–99)
POTASSIUM: 3.5 mmol/L (ref 3.5–5.1)
Sodium: 135 mmol/L (ref 135–145)
Total Bilirubin: 0.7 mg/dL (ref 0.3–1.2)
Total Protein: 7.1 g/dL (ref 6.5–8.1)

## 2017-05-06 LAB — LACTIC ACID, PLASMA
Lactic Acid, Venous: 1.6 mmol/L (ref 0.5–1.9)
Lactic Acid, Venous: 1.2 mmol/L (ref 0.5–1.9)

## 2017-05-06 LAB — CBC WITH DIFFERENTIAL/PLATELET
BASOS PCT: 0 %
Basophils Absolute: 0 10*3/uL (ref 0–0.1)
EOS PCT: 0 %
Eosinophils Absolute: 0 10*3/uL (ref 0–0.7)
HCT: 36.7 % — ABNORMAL LOW (ref 40.0–52.0)
Hemoglobin: 12.2 g/dL — ABNORMAL LOW (ref 13.0–18.0)
LYMPHS PCT: 4 %
Lymphs Abs: 0.5 10*3/uL — ABNORMAL LOW (ref 1.0–3.6)
MCH: 29.9 pg (ref 26.0–34.0)
MCHC: 33.2 g/dL (ref 32.0–36.0)
MCV: 89.9 fL (ref 80.0–100.0)
MONO ABS: 0.7 10*3/uL (ref 0.2–1.0)
Monocytes Relative: 6 %
NEUTROS ABS: 10.4 10*3/uL — AB (ref 1.4–6.5)
Neutrophils Relative %: 90 %
PLATELETS: 203 10*3/uL (ref 150–440)
RBC: 4.08 MIL/uL — AB (ref 4.40–5.90)
RDW: 14.9 % — AB (ref 11.5–14.5)
WBC: 11.7 10*3/uL — AB (ref 3.8–10.6)

## 2017-05-06 LAB — LIPASE, BLOOD: Lipase: 24 U/L (ref 11–51)

## 2017-05-06 LAB — TROPONIN I
TROPONIN I: 0.34 ng/mL — AB (ref <0.03)
Troponin I: 5.27 ng/mL (ref <0.03)

## 2017-05-06 LAB — ABO/RH: ABO/RH(D): A POS

## 2017-05-06 MED ORDER — HEPARIN (PORCINE) IN NACL 100-0.45 UNIT/ML-% IJ SOLN
1200.0000 [IU]/h | INTRAMUSCULAR | Status: DC
  Administered 2017-05-06 – 2017-05-08 (×3): 1100 [IU]/h via INTRAVENOUS
  Filled 2017-05-06 (×2): qty 250

## 2017-05-06 MED ORDER — IOPAMIDOL (ISOVUE-370) INJECTION 76%
75.0000 mL | Freq: Once | INTRAVENOUS | Status: AC | PRN
  Administered 2017-05-06: 75 mL via INTRAVENOUS

## 2017-05-06 MED ORDER — ONDANSETRON HCL 4 MG/2ML IJ SOLN
4.0000 mg | Freq: Once | INTRAMUSCULAR | Status: AC
  Administered 2017-05-06: 4 mg via INTRAVENOUS

## 2017-05-06 MED ORDER — IOPAMIDOL (ISOVUE-300) INJECTION 61%
30.0000 mL | Freq: Once | INTRAVENOUS | Status: AC | PRN
  Administered 2017-05-06: 30 mL via ORAL

## 2017-05-06 MED ORDER — ONDANSETRON HCL 4 MG/2ML IJ SOLN
INTRAMUSCULAR | Status: AC
  Administered 2017-05-06: 4 mg via INTRAVENOUS
  Filled 2017-05-06: qty 2

## 2017-05-06 MED ORDER — HEPARIN BOLUS VIA INFUSION
4000.0000 [IU] | Freq: Once | INTRAVENOUS | Status: AC
  Administered 2017-05-06: 4000 [IU] via INTRAVENOUS
  Filled 2017-05-06: qty 4000

## 2017-05-06 NOTE — ED Notes (Signed)
Patient now back on [place]. Patient is between 93-95% on 5L.

## 2017-05-06 NOTE — ED Notes (Signed)
Patients O2 on 4L [place] has been from 94-98%. Decreased O2 to 3L.

## 2017-05-06 NOTE — ED Notes (Signed)
Patient returned from [place]. O2 level was 78%. Placed patient on [facility]. O2 now 100%.

## 2017-05-06 NOTE — H&P (Signed)
[facility] @ [facility] Admission History and Physical Harvie Bridge, D.O.  ---------------------------------------------------------------------------------------------------------------------   PATIENT NAME: Mike Decker MR#: [id number] DATE OF BIRTH: May 20, 1941 DATE OF ADMISSION: 05/06/2017 PRIMARY CARE PHYSICIAN: Cletis Athens, MD  REQUESTING/REFERRING PHYSICIAN: ED Dr. [name]  CHIEF COMPLAINT: Chief Complaint  Patient presents with  . Diarrhea  . Emesis    HISTORY OF PRESENT ILLNESS: Mike Decker is a 76 y.o. 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    Patient was in a usual state of health until about 3 days ago when he describes sudden onset of left lower quadrant abdominal pain described as cramping associated with liquid diarrhea and nausea. Patient states he has had diverticulitis flareups in the past so he called his primary care provider who prescribed him some Cipro and Flagyl. He took 3 doses of Cipro however his symptoms worsened prompting emergency department visit today.   On his way to the hospital by ambulance today he developed central chest pain associated with shortness of breath, nausea, anxiety. Pain was localized and has since resolved. On arrival he was found to be 2 With an O2 sat of 77%. Patient denies fevers/chills, weakness, dizziness, dysuria/frequency, changes in mental status.   Otherwise there has been no change in status. Patient has been taking medication as prescribed and there has been no recent change in medication or diet.  There has been no recent illness, travel or sick contacts.   In the emergency  department patient was found to have some ST depression on EKG as well as an elevated troponin of 0.34 and 5. He received aspirin and was started on a heparin drip.   PAST MEDICAL HISTORY: Past Medical History:  Diagnosis Date  . Arthritis   . Atrioventricular canal (AVC)    irregular heart beats  . Barrett esophagus   . Cancer [facility]) 2002   prostate  . Colon polyp   . Diabetes mellitus without complication ([place])   . Diverticulosis   . Gout   . Heart disease   . Hemangioma    liver  . Hyperlipidemia   . Hypertension   . Myocardial infarct ([place])   . Ocular hypertension   . Peripheral vascular disease ([place])   . Skin cancer   . Sleep apnea   . Vitreoretinal degeneration       PAST SURGICAL HISTORY: Past Surgical History:  Procedure Laterality Date  . CATARACT EXTRACTION  2011, 2012  . CORONARY ANGIOPLASTY WITH STENT PLACEMENT  2012  . ESOPHAGOGASTRODUODENOSCOPY (EGD) WITH PROPOFOL N/A 06/01/2016   Procedure: ESOPHAGOGASTRODUODENOSCOPY (EGD) WITH PROPOFOL;  Surgeon: Manya Silvas, MD;  Location: [facility] ENDOSCOPY;  Service: Endoscopy;  Laterality: N/A;  . HERNIA REPAIR     x2  . INTRAOCULAR LENS INSERTION    . NOSE SURGERY     submucous resection  . PROSTATE SURGERY  2002  . ROTATOR CUFF REPAIR Right       SOCIAL HISTORY: Social History  Substance Use Topics  . Smoking status: Former Smoker    Years: 20.00    Types: Cigarettes    Quit date: 12/10/1976  . Smokeless tobacco: Never Used  . Alcohol use No   Family History   Medical History Relation Name Comments  No Known Problems  Father    Allergic rhinitis Mother    Asthma Mother    Diabetes type II Mother    Other Mother  temproal arteritis  Down syndrome Son    Down syndrome Son    Anesthesia problems Neg Hx        MEDICATIONS AT HOME: Prior to Admission medications   Medication Sig Start Date End Date Taking? Authorizing Provider  acetaminophen (TYLENOL) 500 MG tablet Take 1,000 mg  by mouth every 6 (six) hours as needed for mild pain, moderate pain or headache.   Yes [provider]  allopurinol (ZYLOPRIM) 100 MG tablet Take 200 mg by mouth 2 (two) times daily.    Yes [provider]  amLODipine (NORVASC) 5 MG tablet TAKE 1 TABLET (5 MG TOTAL) BY MOUTH 2 (TWO) TIMES DAILY. 04/02/17  Yes [provider]  aspirin (ASPIRIN CHILDRENS) 81 MG chewable tablet Chew 1 tablet (81 mg total) by mouth daily. 10/09/15  Yes Mody, Ulice Bold, MD  budesonide-formoterol (SYMBICORT) 160-4.5 MCG/ACT inhaler Inhale 2 puffs into the lungs 2 (two) times daily.   Yes [provider]  cetirizine (ZYRTEC) 5 MG tablet Take 5 mg by mouth daily as needed for allergies or rhinitis.    Yes [provider]  Cholecalciferol (VITAMIN D3) 50000 UNITS CAPS Take 50,000 Units by mouth every 30 (thirty) days.   Yes [provider]  ciprofloxacin (CIPRO) 500 MG tablet Take 500 mg by mouth 2 (two) times daily. 05/05/17 05/14/17 Yes [provider]  citalopram (CELEXA) 10 MG tablet Take 10 mg by mouth daily.   Yes [provider]  cloNIDine (CATAPRES) 0.1 MG tablet Take 1 tablet (0.1 mg total) by mouth 2 (two) times daily. Patient taking differently: Take 0.1 mg by mouth at bedtime.  10/09/15  Yes Mody, Ulice Bold, MD  clopidogrel (PLAVIX) 75 MG tablet Take 1 tablet (75 mg total) by mouth daily. 10/09/15  Yes Mody, Ulice Bold, MD  colchicine 0.6 MG tablet Take 0.6 mg by mouth 3 (three) times daily.   Yes [provider]  dextromethorphan-guaiFENesin (MUCINEX DM) 30-600 MG 12hr tablet Take 1 tablet by mouth 2 (two) times daily as needed for cough.   Yes [provider]  EPINEPHrine (EPI-PEN) 0.3 mg/0.3 mL SOAJ injection Inject into the muscle as needed (anaphylaxis).    Yes [provider]  furosemide (LASIX) 20 MG tablet Take 20 mg by mouth 2 (two) times daily.    Yes [provider]  insulin aspart (NOVOLOG FLEXPEN) 100 UNIT/ML  FlexPen Inject 14-20 Units into the skin See admin instructions. Inject 14 units every morning with breakfast and follow sliding scale for the rest of meals. 08/30/15  Yes [provider]  insulin glargine (LANTUS) 100 UNIT/ML injection Inject 55 Units into the skin at bedtime.    Yes [provider]  isosorbide mononitrate (IMDUR) 120 MG 24 hr tablet Take 1 tablet (120 mg total) by mouth daily. Patient taking differently: Take 60 mg by mouth daily.  10/09/15  Yes Mody, Ulice Bold, MD  lisinopril (PRINIVIL,ZESTRIL) 10 MG tablet Take 10 mg by mouth daily.   Yes [provider]  metoprolol (LOPRESSOR) 50 MG tablet Take 25 mg by mouth 2 (two) times daily.    Yes [provider]  metroNIDAZOLE (FLAGYL) 250 MG tablet Take 250 mg by mouth 3 (three) times daily. 05/05/17 05/14/17 Yes [provider]  nitroGLYCERIN (NITROSTAT) 0.4 MG SL tablet Place 0.4 mg under the tongue every 5 (five) minutes x 3  doses as needed for chest pain.    Yes [provider]  pantoprazole (PROTONIX) 40 MG tablet Take 40 mg by mouth 2 (two) times daily.   Yes [provider]  polyethylene glycol powder (GLYCOLAX/MIRALAX) powder Use as directed 01/20/15  Yes [provider]  rosuvastatin (CRESTOR) 40 MG tablet Take 40 mg by mouth daily.   Yes [provider]  CARAFATE 1 GM/10ML suspension TAKE 2 TEASPOONSFUL (10MLS) BY MOUTH 4 TIMES A DAY BEFORE MEALS AND NIGHTLY FOR 14 DAYS 03/17/17   [provider]      DRUG ALLERGIES: Allergies  Allergen Reactions  . Peanut-Containing Drug Products Anaphylaxis  . Penicillins Hives and Rash    Has patient had a PCN reaction causing immediate rash, facial/tongue/throat swelling, SOB or lightheadedness with hypotension: Yes Has patient had a PCN reaction causing severe rash involving mucus membranes or skin necrosis: No Has patient had a PCN reaction that required hospitalization No Has patient had a PCN reaction  occurring within the last 10 years: No If all of the above answers are "NO", then may proceed with Cephalosporin use.  . Bee Venom Swelling  . Influenza Vaccines Hives  . Inh [Isoniazid] Hives  . Kenalog [Triamcinolone Acetonide] Hives  . Nalfon [Fenoprofen Calcium] Hives     REVIEW OF SYSTEMS: CONSTITUTIONAL: No fatigue, weakness, fever, chills, weight gain/loss, headache EYES: No blurry or double vision. ENT: No tinnitus, postnasal drip, redness or soreness of the oropharynx. RESPIRATORY:Positive dyspnea, negative cough, wheeze, hemoptysis. CARDIOVASCULAR: Positive chest pain, negative orthopnea, palpitations, syncope. GASTROINTESTINAL: positive abdominal pain, nausea, vomiting, diarrhea.. No hematemesis, melena or hematochezia. GENITOURINARY: No dysuria, frequency, hematuria. ENDOCRINE: No polyuria or nocturia. No heat or cold intolerance. HEMATOLOGY: No anemia, bruising, bleeding. INTEGUMENTARY: No rashes, ulcers, lesions. MUSCULOSKELETAL: No pain, arthritis, swelling, gout. NEUROLOGIC: No numbness, tingling, weakness or ataxia. No seizure-type activity. PSYCHIATRIC: No anxiety, depression, insomnia.  PHYSICAL EXAMINATION: VITAL SIGNS: Blood pressure (!) 145/60, pulse 88, temperature (!) 102.6 F (39.2 C), temperature source Oral, resp. rate (!) 26, height 5\' 7"  (1.702 m), weight 87.5 kg (193 lb), SpO2 96 %.  GENERAL: 76 y.o.-year-old white male patient, ill-appearing, lying in the bed in no acute distress.  Pleasant and cooperative.   HEENT: Head atraumatic, normocephalic. Pupils equal, round, reactive to light and accommodation. No scleral icterus. Extraocular muscles intact. Oropharynx is clear. Mucus membranes moist. NECK: Supple, full range of motion. No JVD, no bruit heard. No cervical lymphadenopathy. CHEST: Normal breath sounds bilaterally. No wheezing, rales, rhonchi or crackles. No use of accessory muscles of respiration.  No reproducible chest wall tenderness.    CARDIOVASCULAR: S1, S2 normal. Systolic ejection murmur at left sternal border Cap refill <2 seconds. ABDOMEN: Soft, nondistended.  mild tenderness diffusely. No rebound, guarding, rigidity. Normoactive bowel sounds present in all four quadrants. No organomegaly or mass. EXTREMITIES: Full range of motion. No pedal edema, cyanosis, or clubbing. NEUROLOGIC: Cranial nerves II through XII are grossly intact with no focal sensorimotor deficit. Muscle strength 5/5 in all extremities. Sensation intact. Gait not checked. PSYCHIATRIC: The patient is alert and oriented x 3. Normal affect, mood, thought content. SKIN: Warm, dry, and intact without obvious rash, lesion, or ulcer.  LABORATORY PANEL:  CBC  Recent Labs Lab 05/06/17 1901  WBC 11.7*  HGB 12.2*  HCT 36.7*  PLT 203   ----------------------------------------------------------------------------------------------------------------- Chemistries  Recent Labs Lab 05/06/17 1901  NA 135  K 3.5  CL 101  CO2 22  GLUCOSE 120*  BUN 37*  CREATININE  1.99*  CALCIUM 8.4*  AST 24  ALT 13*  ALKPHOS 69  BILITOT 0.7   ------------------------------------------------------------------------------------------------------------------ Cardiac Enzymes  Recent Labs Lab 05/06/17 2154  TROPONINI 5.27*   ------------------------------------------------------------------------------------------------------------------  RADIOLOGY: Ct Angio Chest Pe W Or Wo Contrast  Result Date: 05/06/2017 CLINICAL DATA:  Tachypneic.  Left lower quadrant abdominal pain. EXAM: CT ANGIOGRAPHY CHEST CT ABDOMEN AND PELVIS WITH CONTRAST TECHNIQUE: Multidetector CT imaging of the chest was performed using the standard protocol during bolus administration of intravenous contrast. Multiplanar CT image reconstructions and MIPs were obtained to evaluate the vascular anatomy. Multidetector CT imaging of the abdomen and pelvis was performed using the standard protocol during  bolus administration of intravenous contrast. CONTRAST:  54mL ISOVUE-300 IOPAMIDOL (ISOVUE-300) INJECTION 61% COMPARISON:  CT scan of May 02, 2016 and April 06, 2014. FINDINGS: CTA CHEST FINDINGS Cardiovascular: Satisfactory opacification of the pulmonary arteries to the segmental level. No evidence of pulmonary embolism. Normal heart size. No pericardial effusion. Atherosclerosis of thoracic aorta is noted without aneurysm. Coronary artery calcifications are noted. Mediastinum/Nodes: No enlarged mediastinal, hilar, or axillary lymph nodes. Thyroid gland, trachea, and esophagus demonstrate no significant findings. Lungs/Pleura: No pneumothorax is noted. Minimal bilateral pleural effusions are noted. Bilateral interstitial densities are noted throughout both lungs most consistent with pulmonary edema. Musculoskeletal: No chest wall abnormality. No acute or significant osseous findings. Review of the MIP images confirms the above findings. CT ABDOMEN and PELVIS FINDINGS Hepatobiliary: Dilated gallbladder is noted but no cholelithiasis or inflammatory changes are noted. 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 Pancreas: Unremarkable. No pancreatic ductal dilatation or surrounding inflammatory changes. Spleen: Normal in size without focal abnormality. Adrenals/Urinary Tract: Adrenal glands are unremarkable. Kidneys are normal, without renal calculi, focal lesion, or hydronephrosis. Bladder is unremarkable. Stomach/Bowel: Stomach is within normal limits. Appendix appears normal. No evidence of bowel wall thickening, distention, or inflammatory changes. Sigmoid diverticulosis is noted. Vascular/Lymphatic: Aortic atherosclerosis. No enlarged abdominal or pelvic lymph nodes. 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 Reproductive: Status post prostatectomy. Other: Moderate size fat containing right inguinal hernia is noted. Musculoskeletal: Stable left acetabular enostosis. No other  significant abnormality seen. Review of the MIP images confirms the above findings. IMPRESSION: No definite evidence pulmonary embolus. Aortic atherosclerosis. Coronary calcifications are noted suggesting coronary artery disease. Bilateral pulmonary edema is noted with minimal pleural effusions. Moderate size fat containing right inguinal hernia. 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 Dilated gallbladder is noted without evidence of inflammation. No cholelithiasis is noted. Electronically Signed   By: Marijo Conception, M.D.   On: 05/06/2017 21:10   Ct Abdomen Pelvis W Contrast  Result Date: 05/06/2017 CLINICAL DATA:  Tachypneic.  Left lower quadrant abdominal pain. EXAM: CT ANGIOGRAPHY CHEST CT ABDOMEN AND PELVIS WITH CONTRAST TECHNIQUE: Multidetector CT imaging of the chest was performed using the standard protocol during bolus administration of intravenous contrast. Multiplanar CT image reconstructions and MIPs were obtained to evaluate the vascular anatomy. Multidetector CT imaging of the abdomen and pelvis was performed using the standard protocol during bolus administration of intravenous contrast. CONTRAST:  73mL ISOVUE-300 IOPAMIDOL (ISOVUE-300) INJECTION 61% COMPARISON:  CT scan of May 02, 2016 and April 06, 2014. FINDINGS: CTA CHEST FINDINGS Cardiovascular: Satisfactory opacification of the pulmonary arteries to the segmental level. No evidence of pulmonary embolism. Normal heart size. No pericardial effusion. Atherosclerosis of thoracic aorta is noted without aneurysm. Coronary artery calcifications are noted. Mediastinum/Nodes: No enlarged mediastinal, hilar,  or axillary lymph nodes. Thyroid gland, trachea, and esophagus demonstrate no significant findings. Lungs/Pleura: No pneumothorax is noted. Minimal bilateral pleural effusions are noted. Bilateral interstitial densities are noted throughout both lungs most consistent with pulmonary  edema. Musculoskeletal: No chest wall abnormality. No acute or significant osseous findings. Review of the MIP images confirms the above findings. CT ABDOMEN and PELVIS FINDINGS Hepatobiliary: Dilated gallbladder is noted but no cholelithiasis or inflammatory changes are noted. 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 Pancreas: Unremarkable. No pancreatic ductal dilatation or surrounding inflammatory changes. Spleen: Normal in size without focal abnormality. Adrenals/Urinary Tract: Adrenal glands are unremarkable. Kidneys are normal, without renal calculi, focal lesion, or hydronephrosis. Bladder is unremarkable. Stomach/Bowel: Stomach is within normal limits. Appendix appears normal. No evidence of bowel wall thickening, distention, or inflammatory changes. Sigmoid diverticulosis is noted. Vascular/Lymphatic: Aortic atherosclerosis. No enlarged abdominal or pelvic lymph nodes. 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 Reproductive: Status post prostatectomy. Other: Moderate size fat containing right inguinal hernia is noted. Musculoskeletal: Stable left acetabular enostosis. No other significant abnormality seen. Review of the MIP images confirms the above findings. IMPRESSION: No definite evidence pulmonary embolus. Aortic atherosclerosis. Coronary calcifications are noted suggesting coronary artery disease. Bilateral pulmonary edema is noted with minimal pleural effusions. Moderate size fat containing right inguinal hernia. 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 Dilated gallbladder is noted without evidence of inflammation. No cholelithiasis is noted. Electronically Signed   By: Marijo Conception, M.D.   On: 05/06/2017 21:10   Dg Chest Port 1 View  Result Date: 05/06/2017 CLINICAL DATA:  Dyspnea, vomiting and diarrhea x2 days. EXAM: PORTABLE CHEST 1 VIEW COMPARISON:  03/23/2013 FINDINGS: Borderline cardiomegaly.  No aortic aneurysm. No pneumonic consolidation or CHF. No effusion or pneumothorax. Mild hyperinflation of the lungs, upper lobe predominant and left greater than right. IMPRESSION: Mild hyperinflation of the lungs, more so in the left upper lobe. Electronically Signed   By: Ashley Royalty M.D.   On: 05/06/2017 19:25    EKG: Normal sinus rhythm 92  bpm with normal axis, ST depressions in V3, V4, V6  and nonspecific ST-T wave changes.   IMPRESSION AND PLAN:  This is a 76 y.o. male with a history of CVA, COPD, diverticulitis, CKD3, Barrett's esophagus with esophageal cancer status post ablation therapy, Coronary artery disease status post stents, diabetes, hypertension, hyperlipidemia peripheral vascular disease, obstructive sleep apnea, osteoarthritis, gout,  now being admitted with:  #. NSTEMI, history of coronary artery disease - Admit to inpatient with telemetry monitoring, continuous pulse ox - Heparin drip -Continue Lopressor, nitroglycerin, Imdur, Plavix, Crestor - Trend troponins, check lipids and TSH. - Check echo - Cardiology consult requested.   #. Hypoxia, Pulmonary edema with pleural effusion, likely secondary to above. No history of heart failure - Intake/output, daily weight. - Mednebs, O2 therapy as needed - Add on BNP -Continue Lasix - Nocturnal CPAP  #. Abdominal pain, unclear etiology possibly viral gastroenteritis given diarrhea - Check stool studies - Antiemetics and antidiarrheals as needed  #. CKD3, Creatinine near baseline - Monitor BMP as patient had IV contrast  #. H/o Diabetes - Accuchecks q4h with RISS coverage - Continue Lantus at half dose - CHeck A1c  #. History of COPD -Continue Symbicort, 0 tach   #. History of hypertension -Continue Norvasc, Lopressor, Catapres, lisinopril  #. History of Barrett's esophagus/GERD -Continue Carafate, Protonix  #. History of depression -Continue Celexa  #. History  of gout -Continue allopurinol,  colchicine  #. History of obstructive sleep apnea -Continue CPAP at night per home regimen  Admission status: Inpatient, telemetry Diet/Nutrition: Nothing by mouth  Fluids: HL Consults: Cardiology - Dr. Clayborn Bigness notified DVT Px: heparin SCDs and early ambulation Code Status: Full Disposition Plan: To home in 1-2 days  All the records are reviewed and case discussed with ED provider. Management plans discussed with the patient and/or family who express understanding and agree with plan of care.   TOTAL TIME TAKING CARE OF THIS PATIENT: 70 minutes.   Cavan Bearden D.O. on 05/06/2017 at 11:59 PM Between 7am to 6pm - Pager - [phone] After 6pm go to [email or web address] - Proofreader [employer] [facility] Office [phone] CC: Primary care physician; Cletis Athens, MD     Note: This dictation was prepared with Dragon dictation along with smaller phrase technology. Any transcriptional errors that result from this process are unintentional.

## 2017-05-06 NOTE — ED Provider Notes (Signed)
[facility] Emergency Department Provider Note   ____________________________________________    I have reviewed the triage vital signs and the nursing notes.   HISTORY  Chief Complaint Diarrhea and Emesis     HPI Mike Decker is a 76 y.o. male who presents with complaints of abdominal pain. Patient reports 3-4 days of left lower abdominal pain which she describes as moderate to severe and cramping in nature. He reports this is similar to prior diverticulitis flares. 2 days ago he started antibiotics called in by his PCP, Cipro and Flagyl. He reports chills but is unsure about fevers. he does have nausea. He reports normal stools. Patient also reports feeling short of breath but denies cough. He does not smoke. Denies chest pain.   Past Medical History:  Diagnosis Date  . Arthritis   . Atrioventricular canal (AVC)    irregular heart beats  . Barrett esophagus   . Cancer [facility]) 2002   prostate  . Colon polyp   . Diabetes mellitus without complication ([place])   . Diverticulosis   . Gout   . Heart disease   . Hemangioma    liver  . Hyperlipidemia   . Hypertension   . Myocardial infarct ([place])   . Ocular hypertension   . Peripheral vascular disease ([place])   . Skin cancer   . Sleep apnea   . Vitreoretinal degeneration     Patient Active Problem List   Diagnosis Date Noted  . PAD (peripheral artery disease) ([place]) 05/01/2017  . Barrett's esophagus 10/02/2016  . Carotid stenosis 10/02/2016  . Essential hypertension 10/02/2016  . Hyperlipidemia 10/02/2016  . Diabetes ([place]) 10/02/2016  . TIA (transient ischemic attack) 10/08/2015    Past Surgical History:  Procedure Laterality Date  . CATARACT EXTRACTION  2011, 2012  . CORONARY ANGIOPLASTY WITH STENT PLACEMENT  2012  . ESOPHAGOGASTRODUODENOSCOPY (EGD) WITH PROPOFOL N/A 06/01/2016   Procedure: ESOPHAGOGASTRODUODENOSCOPY (EGD) WITH PROPOFOL;  Surgeon: Manya Silvas, MD;  Location: [facility]  ENDOSCOPY;  Service: Endoscopy;  Laterality: N/A;  . HERNIA REPAIR     x2  . INTRAOCULAR LENS INSERTION    . NOSE SURGERY     submucous resection  . PROSTATE SURGERY  2002  . ROTATOR CUFF REPAIR Right     Prior to Admission medications   Medication Sig Start Date End Date Taking? Authorizing Provider  acetaminophen (TYLENOL) 500 MG tablet Take 1,000 mg by mouth every 6 (six) hours as needed for mild pain, moderate pain or headache.   Yes [provider]  allopurinol (ZYLOPRIM) 100 MG tablet Take 200 mg by mouth 2 (two) times daily.    Yes [provider]  amLODipine (NORVASC) 5 MG tablet TAKE 1 TABLET (5 MG TOTAL) BY MOUTH 2 (TWO) TIMES DAILY. 04/02/17  Yes [provider]  aspirin (ASPIRIN CHILDRENS) 81 MG chewable tablet Chew 1 tablet (81 mg total) by mouth daily. 10/09/15  Yes Mody, Ulice Bold, MD  budesonide-formoterol (SYMBICORT) 160-4.5 MCG/ACT inhaler Inhale 2 puffs into the lungs 2 (two) times daily.   Yes [provider]  cetirizine (ZYRTEC) 5 MG tablet Take 5 mg by mouth daily as needed for allergies or rhinitis.    Yes [provider]  Cholecalciferol (VITAMIN D3) 50000 UNITS CAPS Take 50,000 Units by mouth every 30 (thirty) days.   Yes [provider]  ciprofloxacin (CIPRO) 500 MG tablet Take 500 mg by mouth 2 (two) times daily. 05/05/17 05/14/17 Yes [provider]  citalopram (CELEXA)  10 MG tablet Take 10 mg by mouth daily.   Yes [provider]  cloNIDine (CATAPRES) 0.1 MG tablet Take 1 tablet (0.1 mg total) by mouth 2 (two) times daily. Patient taking differently: Take 0.1 mg by mouth at bedtime.  10/09/15  Yes Mody, Ulice Bold, MD  clopidogrel (PLAVIX) 75 MG tablet Take 1 tablet (75 mg total) by mouth daily. 10/09/15  Yes Mody, Ulice Bold, MD  colchicine 0.6 MG tablet Take 0.6 mg by mouth 3 (three) times daily.   Yes [provider]  dextromethorphan-guaiFENesin (MUCINEX DM) 30-600 MG 12hr tablet Take 1 tablet by  mouth 2 (two) times daily as needed for cough.   Yes [provider]  EPINEPHrine (EPI-PEN) 0.3 mg/0.3 mL SOAJ injection Inject into the muscle as needed (anaphylaxis).    Yes [provider]  furosemide (LASIX) 20 MG tablet Take 20 mg by mouth 2 (two) times daily.    Yes [provider]  insulin aspart (NOVOLOG FLEXPEN) 100 UNIT/ML FlexPen Inject 14-20 Units into the skin See admin instructions. Inject 14 units every morning with breakfast and follow sliding scale for the rest of meals. 08/30/15  Yes [provider]  insulin glargine (LANTUS) 100 UNIT/ML injection Inject 55 Units into the skin at bedtime.    Yes [provider]  isosorbide mononitrate (IMDUR) 120 MG 24 hr tablet Take 1 tablet (120 mg total) by mouth daily. Patient taking differently: Take 60 mg by mouth daily.  10/09/15  Yes Mody, Ulice Bold, MD  lisinopril (PRINIVIL,ZESTRIL) 10 MG tablet Take 10 mg by mouth daily.   Yes [provider]  metoprolol (LOPRESSOR) 50 MG tablet Take 25 mg by mouth 2 (two) times daily.    Yes [provider]  metroNIDAZOLE (FLAGYL) 250 MG tablet Take 250 mg by mouth 3 (three) times daily. 05/05/17 05/14/17 Yes [provider]  nitroGLYCERIN (NITROSTAT) 0.4 MG SL tablet Place 0.4 mg under the tongue every 5 (five) minutes x 3 doses as needed for chest pain.    Yes [provider]  pantoprazole (PROTONIX) 40 MG tablet Take 40 mg by mouth 2 (two) times daily.   Yes [provider]  polyethylene glycol powder (GLYCOLAX/MIRALAX) powder Use as directed 01/20/15  Yes [provider]  rosuvastatin (CRESTOR) 40 MG tablet Take 40 mg by mouth daily.   Yes [provider]  CARAFATE 1 GM/10ML suspension TAKE 2 TEASPOONSFUL (10MLS) BY MOUTH 4 TIMES A DAY BEFORE MEALS AND NIGHTLY FOR 14 DAYS 03/17/17   [provider]     Allergies Peanut-containing drug products; Penicillins; Bee venom; Influenza vaccines; Inh  [isoniazid]; Kenalog [triamcinolone acetonide]; and Nalfon [fenoprofen calcium]  Family History  Problem Relation Age of Onset  . Stroke Maternal Grandfather     Social History Social History  Substance Use Topics  . Smoking status: Former Smoker    Years: 20.00    Types: Cigarettes    Quit date: 12/10/1976  . Smokeless tobacco: Never Used  . Alcohol use No    Review of Systems  Constitutional: No fever/chills Eyes: No visual changes.  ENT: No sore throat. Cardiovascular: Denies chest pain. Respiratory: Denies shortness of breath. Gastrointestinal: No abdominal pain.  No nausea, no vomiting.   Genitourinary: Negative for dysuria. Musculoskeletal: Negative for back pain. Skin: Negative for rash. Neurological: Negative for headaches or weakness   ____________________________________________   PHYSICAL EXAM:  VITAL SIGNS: ED Triage Vitals  Enc Vitals Group     BP 05/06/17 1856 (!) 157/64  Pulse Rate 05/06/17 1856 94     Resp 05/06/17 1856 (!) 24     Temp 05/06/17 1856 (!) 102.6 F (39.2 C)     Temp Source 05/06/17 1856 Oral     SpO2 05/06/17 1856 91 %     Weight 05/06/17 1857 87.5 kg (193 lb)     Height 05/06/17 1857 1.702 m (5\' 7" )     Head Circumference --      Peak Flow --      Pain Score 05/06/17 1855 6     Pain Loc --      Pain Edu? --      Excl. in [place]? --     Constitutional: Alert and oriented. Ill-appearing Eyes: Conjunctivae are normal.  Head: Atraumatic. Nose: No congestion/rhinnorhea. Mouth/Throat: Mucous membranes are moist.    Cardiovascular: Normal rate, regular rhythm. Grossly normal heart sounds.  Good peripheral circulation. Respiratory: Normal respiratory effort.  No retractions. Lungs CTAB. Gastrointestinal: Tenderness to palpation primarily in the left lower quadrant.. No distention.  No CVA tenderness. Genitourinary: deferred Musculoskeletal: No lower extremity tenderness nor edema.  Warm and well perfused Neurologic:  Normal  speech and language. No gross focal neurologic deficits are appreciated.  Skin:  Skin is warm, dry and intact. No rash noted. Psychiatric: Mood and affect are normal. Speech and behavior are normal.  ____________________________________________   LABS (all labs ordered are listed, but only abnormal results are displayed)  Labs Reviewed  COMPREHENSIVE METABOLIC PANEL - Abnormal; Notable for the following:       Result Value   Glucose, Bld 120 (*)    BUN 37 (*)    Creatinine, Ser 1.99 (*)    Calcium 8.4 (*)    ALT 13 (*)    GFR calc non Af Amer 31 (*)    GFR calc Af Amer 36 (*)    All other components within normal limits  CBC WITH DIFFERENTIAL/PLATELET - Abnormal; Notable for the following:    WBC 11.7 (*)    RBC 4.08 (*)    Hemoglobin 12.2 (*)    HCT 36.7 (*)    RDW 14.9 (*)    Neutro Abs 10.4 (*)    Lymphs Abs 0.5 (*)    All other components within normal limits  URINALYSIS, ROUTINE W REFLEX MICROSCOPIC - Abnormal; Notable for the following:    Color, Urine YELLOW (*)    APPearance CLEAR (*)    Hgb urine dipstick MODERATE (*)    Ketones, ur 5 (*)    Protein, ur 100 (*)    All other components within normal limits  TROPONIN I - Abnormal; Notable for the following:    Troponin I 0.34 (*)    All other components within normal limits  CULTURE, BLOOD (ROUTINE X 2)  CULTURE, BLOOD (ROUTINE X 2)  URINE CULTURE  LACTIC ACID, PLASMA  LIPASE, BLOOD  LACTIC ACID, PLASMA  TYPE AND SCREEN   ____________________________________________  EKG  ED ECG REPORT I, Lavonia Drafts, the attending physician, personally viewed and interpreted this ECG.  Date: 05/06/2017 EKG Time: 6:52 PM Rate: 92 Rhythm: normal sinus rhythm QRS Axis: normal Intervals: normal ST/T Wave abnormalities: ST depressions laterally specifically in V3 V4 and V6 Conduction Disturbances: none   ____________________________________________  RADIOLOGY  CT angiography chest CT abdomen and  pelvis ____________________________________________   PROCEDURES  Procedure(s) performed: No    Critical Care performed: yes  CRITICAL CARE Performed by: Lavonia Drafts   Total critical care time: 50 minutes  Critical care time was exclusive of separately billable procedures and treating other patients.  Critical care was necessary to treat or prevent imminent or life-threatening deterioration.  Critical care was time spent personally by me on the following activities: development of treatment plan with patient and/or surrogate as well as nursing, discussions with consultants, evaluation of patient's response to treatment, examination of patient, obtaining history from patient or surrogate, ordering and performing treatments and interventions, ordering and review of laboratory studies, ordering and review of radiographic studies, pulse oximetry and re-evaluation of patient's condition.  ____________________________________________   INITIAL IMPRESSION / ASSESSMENT AND PLAN / ED COURSE  Pertinent labs & imaging results that were available during my care of the patient were reviewed by me and considered in my medical decision making (see chart for details).  Patient febrile with abdominal pain but also markedly hypoxic, 80% on room air. Concern for possible aspiration given that he was having vomiting. Patient denies chest pain however he does have ST depressions laterally . PE is a possibility as well. We will obtain chest x-ray, labs, CT abdomen and pelvis.  ----------------------------------------- 8:21 PM on 05/06/2017 -----------------------------------------  Notified of elevated troponin. Patient remains chest pain-free. Lactic acid is unremarkable. Patient's chronic kidney disease and I discussed with him the need to obtain CT scan to rule out PE given abrupt onset of shortness of breath elevated troponin and evidence of right heart  strain  ----------------------------------------- 8:57 PM on 05/06/2017 -----------------------------------------  Patient back from CT, pending radiology report  CT report: No PE no evidence of diverticulitis no pneumonia. Unclear cause of patient's fever and left lower abdominal pain. Shortness of breath may be caused by an STEMI/congestive heart failure, no antibiotics at this time given normal lactic acid and unclear source of fever as this does not appear to be related to sepsis     ____________________________________________   FINAL CLINICAL IMPRESSION(S) / ED DIAGNOSES  Final diagnoses:  SOB (shortness of breath)  NSTEMI (non-ST elevated myocardial infarction) (HCC)  Left lower quadrant pain  Fever, unspecified fever cause      NEW MEDICATIONS STARTED DURING THIS VISIT:  New Prescriptions   No medications on file     Note:  This document was prepared using Dragon voice recognition software and may include unintentional dictation errors.    Lavonia Drafts, MD 05/06/17 2119

## 2017-05-06 NOTE — ED Notes (Addendum)
Patients wife was saying that his fingers was blue. Patients [place] was off his O2 was 88%. [place] placed back on [place] on 4L. 92-96%. Bilateral hands all finger tips are bluish in color. Toes are appropriate coloration.

## 2017-05-06 NOTE — Progress Notes (Signed)
ANTICOAGULATION CONSULT NOTE - Initial Consult  Pharmacy Consult for Heparin  Indication: chest pain/ACS  Allergies  Allergen Reactions  . Peanut-Containing Drug Products Anaphylaxis  . Penicillins Hives and Rash    Has patient had a PCN reaction causing immediate rash, facial/tongue/throat swelling, SOB or lightheadedness with hypotension: Yes Has patient had a PCN reaction causing severe rash involving mucus membranes or skin necrosis: No Has patient had a PCN reaction that required hospitalization No Has patient had a PCN reaction occurring within the last 10 years: No If all of the above answers are "NO", then may proceed with Cephalosporin use.  . Bee Venom Swelling  . Influenza Vaccines Hives  . Inh [Isoniazid] Hives  . Kenalog [Triamcinolone Acetonide] Hives  . Nalfon [Fenoprofen Calcium] Hives    Patient Measurements: Height: 5\' 7"  (170.2 cm) Weight: 193 lb (87.5 kg) IBW/kg (Calculated) : 66.1 Heparin Dosing Weight: 84.1 kg   Vital Signs: Temp: 102.6 F (39.2 C) (05/28 1856) Temp Source: Oral (05/28 1856) BP: 139/84 (05/28 2034) Pulse Rate: 93 (05/28 2058)  Labs:  Recent Labs  05/06/17 1901  HGB 12.2*  HCT 36.7*  PLT 203  CREATININE 1.99*  TROPONINI 0.34*    Estimated Creatinine Clearance: 32.8 mL/min (A) (by C-G formula based on SCr of 1.99 mg/dL (H)).   Medical History: Past Medical History:  Diagnosis Date  . Arthritis   . Atrioventricular canal (AVC)    irregular heart beats  . Barrett esophagus   . Cancer [facility]) 2002   prostate  . Colon polyp   . Diabetes mellitus without complication ([place])   . Diverticulosis   . Gout   . Heart disease   . Hemangioma    liver  . Hyperlipidemia   . Hypertension   . Myocardial infarct ([place])   . Ocular hypertension   . Peripheral vascular disease ([place])   . Skin cancer   . Sleep apnea   . Vitreoretinal degeneration     Medications:  Scheduled:  . heparin  4,000 Units Intravenous Once     Assessment: Pharmacy consulted to dose heparin in this 76 year old male admitted with ACS/NSTEMI.  No prior anticoag noted.  CrCl = 32.8 ml/min  Goal of Therapy:  Heparin level 0.3-0.7 units/ml Monitor platelets by anticoagulation protocol: Yes   Plan:  Give 4000 units bolus x 1 Start heparin infusion at 1100 units/hr Check anti-Xa level in 8 hours and daily while on heparin Continue to monitor H&H and platelets  Latronda Spink D 05/06/2017,9:20 PM

## 2017-05-06 NOTE — ED Notes (Signed)
Code sepsis called to tammy at [facility]

## 2017-05-06 NOTE — ED Provider Notes (Signed)
Discussed with Dr. Clayborn Bigness significantly elevated troponin. EKG actually looks improved. He recommends continuing heparin no additional therapy at this time.   Mike Drafts, MD 05/06/17 [phone]

## 2017-05-06 NOTE — ED Notes (Signed)
Pt transported to CT ?

## 2017-05-06 NOTE — ED Notes (Signed)
Pt returned from CT °

## 2017-05-06 NOTE — ED Triage Notes (Addendum)
Per EMS report, Patient c/o diarrhea and vomiting for 2 days. Patient contacted his PMD and was placed on Flagly and Cipro. Patient states symptoms worsened today. Patient has hx. Of diverticulitis. Patient was tachypneic upon arrival, sats at 77%. Patient placed on 5L O2 via [place] to obtain sats in mid-90's. Patient does not wear O2 at home.

## 2017-05-07 ENCOUNTER — Inpatient Hospital Stay
Admit: 2017-05-07 | Discharge: 2017-05-07 | Disposition: A | Discharge status: Home / Self Care | Payer: Medicare Other | Attending: Family Medicine | Admitting: Family Medicine

## 2017-05-07 ENCOUNTER — Inpatient Hospital Stay: Payer: Medicare Other

## 2017-05-07 ENCOUNTER — Encounter: Payer: Self-pay | Admitting: *Deleted

## 2017-05-07 DIAGNOSIS — I501 Left ventricular failure: Secondary | ICD-10-CM

## 2017-05-07 DIAGNOSIS — I214 Non-ST elevation (NSTEMI) myocardial infarction: Principal | ICD-10-CM

## 2017-05-07 LAB — BASIC METABOLIC PANEL
Anion gap: 13 (ref 5–15)
BUN: 44 mg/dL — AB (ref 6–20)
CALCIUM: 7.9 mg/dL — AB (ref 8.9–10.3)
CO2: 20 mmol/L — ABNORMAL LOW (ref 22–32)
Chloride: 103 mmol/L (ref 101–111)
Creatinine, Ser: 2.32 mg/dL — ABNORMAL HIGH (ref 0.61–1.24)
GFR calc Af Amer: 30 mL/min — ABNORMAL LOW (ref >60)
GFR calc non Af Amer: 25 mL/min — ABNORMAL LOW (ref >60)
GLUCOSE: 238 mg/dL — AB (ref 65–99)
Potassium: 4.3 mmol/L (ref 3.5–5.1)
Sodium: 136 mmol/L (ref 135–145)

## 2017-05-07 LAB — GLUCOSE, CAPILLARY
GLUCOSE-CAPILLARY: 147 mg/dL — AB (ref 65–99)
GLUCOSE-CAPILLARY: 162 mg/dL — AB (ref 65–99)
GLUCOSE-CAPILLARY: 181 mg/dL — AB (ref 65–99)
GLUCOSE-CAPILLARY: 218 mg/dL — AB (ref 65–99)
Glucose-Capillary: 169 mg/dL — ABNORMAL HIGH (ref 65–99)
Glucose-Capillary: 178 mg/dL — ABNORMAL HIGH (ref 65–99)
Glucose-Capillary: 201 mg/dL — ABNORMAL HIGH (ref 65–99)
Glucose-Capillary: 168 mg/dL — ABNORMAL HIGH (ref 65–99)

## 2017-05-07 LAB — BLOOD GAS, ARTERIAL
ACID-BASE DEFICIT: 10.1 mmol/L — AB (ref 0.0–2.0)
BICARBONATE: 14 mmol/L — AB (ref 20.0–28.0)
Expiratory PAP: 8
FIO2: 1
Inspiratory PAP: 12
O2 SAT: 88.9 %
PATIENT TEMPERATURE: 37
pCO2 arterial: 26 mmHg — ABNORMAL LOW (ref 32.0–48.0)
pH, Arterial: 7.34 — ABNORMAL LOW (ref 7.350–7.450)
pO2, Arterial: 60 mmHg — ABNORMAL LOW (ref 83.0–108.0)

## 2017-05-07 LAB — TROPONIN I
TROPONIN I: 15.97 ng/mL — AB (ref <0.03)
TROPONIN I: 21.51 ng/mL — AB (ref <0.03)
Troponin I: 23.72 ng/mL (ref <0.03)

## 2017-05-07 LAB — GASTROINTESTINAL PANEL BY PCR, STOOL (REPLACES STOOL CULTURE)
Adenovirus F40/41: NOT DETECTED
Astrovirus: NOT DETECTED
CAMPYLOBACTER SPECIES: NOT DETECTED
CRYPTOSPORIDIUM: NOT DETECTED
Cyclospora cayetanensis: NOT DETECTED
E. COLI O157: NOT DETECTED
Entamoeba histolytica: NOT DETECTED
Enteroaggregative E coli (EAEC): NOT DETECTED
Enteropathogenic E coli (EPEC): NOT DETECTED
Enterotoxigenic E coli (ETEC): NOT DETECTED
Giardia lamblia: NOT DETECTED
Norovirus GI/GII: NOT DETECTED
PLESIMONAS SHIGELLOIDES: NOT DETECTED
ROTAVIRUS A: NOT DETECTED
SALMONELLA SPECIES: DETECTED — AB
SAPOVIRUS (I, II, IV, AND V): NOT DETECTED
SHIGA LIKE TOXIN PRODUCING E COLI (STEC): NOT DETECTED
SHIGELLA/ENTEROINVASIVE E COLI (EIEC): NOT DETECTED
Vibrio cholerae: NOT DETECTED
Vibrio species: NOT DETECTED
Yersinia enterocolitica: NOT DETECTED

## 2017-05-07 LAB — LIPID PANEL
Cholesterol: 92 mg/dL (ref 0–200)
HDL: 23 mg/dL — AB (ref >40)
LDL CALC: 35 mg/dL (ref 0–99)
Total CHOL/HDL Ratio: 4 RATIO
Triglycerides: 168 mg/dL — ABNORMAL HIGH (ref <150)
VLDL: 34 mg/dL (ref 0–40)

## 2017-05-07 LAB — C DIFFICILE QUICK SCREEN W PCR REFLEX
C DIFFICILE (CDIFF) INTERP: NOT DETECTED
C DIFFICILE (CDIFF) TOXIN: NEGATIVE
C Diff antigen: NEGATIVE

## 2017-05-07 LAB — CBC
HCT: 35 % — ABNORMAL LOW (ref 40.0–52.0)
Hemoglobin: 11.6 g/dL — ABNORMAL LOW (ref 13.0–18.0)
MCH: 29.2 pg (ref 26.0–34.0)
MCHC: 33.2 g/dL (ref 32.0–36.0)
MCV: 88 fL (ref 80.0–100.0)
PLATELETS: 208 10*3/uL (ref 150–440)
RBC: 3.98 MIL/uL — ABNORMAL LOW (ref 4.40–5.90)
RDW: 14.9 % — AB (ref 11.5–14.5)
WBC: 15.1 10*3/uL — AB (ref 3.8–10.6)

## 2017-05-07 LAB — PROTEIN / CREATININE RATIO, URINE
Creatinine, Urine: 113 mg/dL
Protein Creatinine Ratio: 0.28 mg/mg{Cre} — ABNORMAL HIGH (ref 0.00–0.15)
Total Protein, Urine: 32 mg/dL

## 2017-05-07 LAB — PROTIME-INR
INR: 1.1
Prothrombin Time: 14.2 seconds (ref 11.4–15.2)

## 2017-05-07 LAB — HEPARIN LEVEL (UNFRACTIONATED)
HEPARIN UNFRACTIONATED: 0.36 [IU]/mL (ref 0.30–0.70)
Heparin Unfractionated: 0.3 IU/mL (ref 0.30–0.70)

## 2017-05-07 LAB — TSH: TSH: 3.098 u[IU]/mL (ref 0.350–4.500)

## 2017-05-07 LAB — BRAIN NATRIURETIC PEPTIDE: B NATRIURETIC PEPTIDE 5: 671 pg/mL — AB (ref 0.0–100.0)

## 2017-05-07 LAB — T4, FREE: FREE T4: 0.89 ng/dL (ref 0.61–1.12)

## 2017-05-07 LAB — MRSA PCR SCREENING: MRSA BY PCR: NEGATIVE

## 2017-05-07 LAB — ECHOCARDIOGRAM COMPLETE
Height: 67 in
WEIGHTICAEL: 3040.58 [oz_av]

## 2017-05-07 LAB — MAGNESIUM: Magnesium: 1.7 mg/dL (ref 1.7–2.4)

## 2017-05-07 MED ORDER — NITROGLYCERIN 0.4 MG SL SUBL: 0.4000 mg | SUBLINGUAL_TABLET | SUBLINGUAL | Status: DC | PRN

## 2017-05-07 MED ORDER — DEXTROMETHORPHAN POLISTIREX ER 30 MG/5ML PO SUER
30.0000 mg | Freq: Two times a day (BID) | ORAL | Status: DC | PRN
Start: 2017-05-07 — End: 2017-05-08
  Administered 2017-05-07 – 2017-05-08 (×2): 30 mg via ORAL
  Filled 2017-05-07 (×2): qty 5

## 2017-05-07 MED ORDER — CIPROFLOXACIN IN D5W 400 MG/200ML IV SOLN
400.0000 mg | Freq: Two times a day (BID) | INTRAVENOUS | Status: DC
  Administered 2017-05-07: 400 mg via INTRAVENOUS
  Filled 2017-05-07 (×2): qty 200

## 2017-05-07 MED ORDER — CLONIDINE HCL 0.1 MG PO TABS
0.1000 mg | ORAL_TABLET | Freq: Every day | ORAL | Status: DC
  Administered 2017-05-07 (×2): 0.1 mg via ORAL
  Filled 2017-05-07 (×2): qty 1

## 2017-05-07 MED ORDER — ISOSORBIDE MONONITRATE ER 60 MG PO TB24: 60.0000 mg | ORAL_TABLET | Freq: Every day | ORAL | Status: DC

## 2017-05-07 MED ORDER — FUROSEMIDE 10 MG/ML IJ SOLN: 20.0000 mg | Freq: Two times a day (BID) | INTRAMUSCULAR | Status: DC

## 2017-05-07 MED ORDER — ROSUVASTATIN CALCIUM 20 MG PO TABS
40.0000 mg | ORAL_TABLET | Freq: Every day | ORAL | Status: DC
  Administered 2017-05-08 – 2017-05-15 (×8): 40 mg via ORAL
  Filled 2017-05-07 (×8): qty 2

## 2017-05-07 MED ORDER — FUROSEMIDE 10 MG/ML IJ SOLN
20.0000 mg | Freq: Two times a day (BID) | INTRAMUSCULAR | Status: DC
  Administered 2017-05-08: 20 mg via INTRAVENOUS
  Filled 2017-05-07: qty 2

## 2017-05-07 MED ORDER — COLCHICINE 0.6 MG PO TABS: 0.6000 mg | ORAL_TABLET | Freq: Three times a day (TID) | ORAL | Status: DC

## 2017-05-07 MED ORDER — ONDANSETRON HCL 4 MG/2ML IJ SOLN
4.0000 mg | Freq: Four times a day (QID) | INTRAMUSCULAR | Status: DC | PRN
  Administered 2017-05-07: 4 mg via INTRAVENOUS
  Filled 2017-05-07: qty 2

## 2017-05-07 MED ORDER — SUCRALFATE 1 GM/10ML PO SUSP
1.0000 g | Freq: Three times a day (TID) | ORAL | Status: DC
  Administered 2017-05-07 – 2017-05-15 (×30): 1 g via ORAL
  Filled 2017-05-07 (×34): qty 10

## 2017-05-07 MED ORDER — DM-GUAIFENESIN ER 30-600 MG PO TB12: 1.0000 | ORAL_TABLET | Freq: Two times a day (BID) | ORAL | Status: DC | PRN

## 2017-05-07 MED ORDER — ASPIRIN 81 MG PO CHEW: 81.0000 mg | CHEWABLE_TABLET | Freq: Every day | ORAL | Status: DC

## 2017-05-07 MED ORDER — FUROSEMIDE 10 MG/ML IJ SOLN
40.0000 mg | Freq: Once | INTRAMUSCULAR | Status: AC
  Administered 2017-05-07: 40 mg via INTRAVENOUS
  Filled 2017-05-07: qty 4

## 2017-05-07 MED ORDER — VITAMIN D (ERGOCALCIFEROL) 1.25 MG (50000 UNIT) PO CAPS
50000.0000 [IU] | ORAL_CAPSULE | ORAL | Status: DC
  Filled 2017-05-07: qty 1

## 2017-05-07 MED ORDER — VITAMIN D3 1.25 MG (50000 UT) PO CAPS: 50000.0000 [IU] | ORAL_CAPSULE | ORAL | Status: DC

## 2017-05-07 MED ORDER — PANTOPRAZOLE SODIUM 40 MG IV SOLR
40.0000 mg | Freq: Two times a day (BID) | INTRAVENOUS | Status: DC
  Administered 2017-05-07 – 2017-05-09 (×6): 40 mg via INTRAVENOUS
  Filled 2017-05-07 (×6): qty 40

## 2017-05-07 MED ORDER — INSULIN GLARGINE 100 UNIT/ML ~~LOC~~ SOLN
10.0000 [IU] | Freq: Every day | SUBCUTANEOUS | Status: DC
  Administered 2017-05-07 – 2017-05-10 (×4): 10 [IU] via SUBCUTANEOUS
  Filled 2017-05-07 (×5): qty 0.1

## 2017-05-07 MED ORDER — IPRATROPIUM-ALBUTEROL 0.5-2.5 (3) MG/3ML IN SOLN
3.0000 mL | RESPIRATORY_TRACT | Status: DC
  Administered 2017-05-07 – 2017-05-13 (×35): 3 mL via RESPIRATORY_TRACT
  Filled 2017-05-07 (×36): qty 3

## 2017-05-07 MED ORDER — ALLOPURINOL 100 MG PO TABS
200.0000 mg | ORAL_TABLET | Freq: Two times a day (BID) | ORAL | Status: DC
  Administered 2017-05-07 – 2017-05-15 (×16): 200 mg via ORAL
  Filled 2017-05-07 (×16): qty 2

## 2017-05-07 MED ORDER — ACETAMINOPHEN 500 MG PO TABS
1000.0000 mg | ORAL_TABLET | Freq: Four times a day (QID) | ORAL | Status: DC | PRN
  Administered 2017-05-08: 1000 mg via ORAL
  Filled 2017-05-07: qty 2

## 2017-05-07 MED ORDER — MORPHINE SULFATE (PF) 2 MG/ML IV SOLN
1.0000 mg | INTRAVENOUS | Status: DC | PRN
  Administered 2017-05-07 – 2017-05-08 (×4): 1 mg via INTRAVENOUS
  Filled 2017-05-07 (×4): qty 1

## 2017-05-07 MED ORDER — INSULIN ASPART 100 UNIT/ML ~~LOC~~ SOLN
0.0000 [IU] | SUBCUTANEOUS | Status: DC
  Administered 2017-05-07 (×3): 3 [IU] via SUBCUTANEOUS
  Administered 2017-05-07: 2 [IU] via SUBCUTANEOUS
  Administered 2017-05-07: 5 [IU] via SUBCUTANEOUS
  Administered 2017-05-07 – 2017-05-08 (×4): 3 [IU] via SUBCUTANEOUS
  Administered 2017-05-08 (×2): 5 [IU] via SUBCUTANEOUS
  Administered 2017-05-08 – 2017-05-09 (×3): 3 [IU] via SUBCUTANEOUS
  Administered 2017-05-09: 5 [IU] via SUBCUTANEOUS
  Administered 2017-05-09 (×4): 3 [IU] via SUBCUTANEOUS
  Administered 2017-05-10: 5 [IU] via SUBCUTANEOUS
  Administered 2017-05-10 (×3): 3 [IU] via SUBCUTANEOUS
  Administered 2017-05-10: 5 [IU] via SUBCUTANEOUS
  Administered 2017-05-10: 3 [IU] via SUBCUTANEOUS
  Administered 2017-05-11: 5 [IU] via SUBCUTANEOUS
  Administered 2017-05-11: 11 [IU] via SUBCUTANEOUS
  Administered 2017-05-11: 8 [IU] via SUBCUTANEOUS
  Administered 2017-05-11: 15 [IU] via SUBCUTANEOUS
  Filled 2017-05-07 (×9): qty 3
  Filled 2017-05-07: qty 11
  Filled 2017-05-07: qty 2
  Filled 2017-05-07: qty 5
  Filled 2017-05-07 (×2): qty 3
  Filled 2017-05-07: qty 5
  Filled 2017-05-07 (×3): qty 3
  Filled 2017-05-07: qty 15
  Filled 2017-05-07: qty 3
  Filled 2017-05-07: qty 2
  Filled 2017-05-07: qty 5
  Filled 2017-05-07 (×2): qty 3
  Filled 2017-05-07 (×2): qty 5
  Filled 2017-05-07: qty 3
  Filled 2017-05-07: qty 8
  Filled 2017-05-07: qty 5
  Filled 2017-05-07 (×2): qty 3

## 2017-05-07 MED ORDER — PANTOPRAZOLE SODIUM 40 MG PO TBEC: 40.0000 mg | DELAYED_RELEASE_TABLET | Freq: Two times a day (BID) | ORAL | Status: DC

## 2017-05-07 MED ORDER — INSULIN GLARGINE 100 UNIT/ML ~~LOC~~ SOLN
25.0000 [IU] | Freq: Every day | SUBCUTANEOUS | Status: DC
  Filled 2017-05-07 (×2): qty 0.25

## 2017-05-07 MED ORDER — CIPROFLOXACIN IN D5W 400 MG/200ML IV SOLN
400.0000 mg | INTRAVENOUS | Status: DC
  Administered 2017-05-08 – 2017-05-09 (×2): 400 mg via INTRAVENOUS
  Filled 2017-05-07 (×2): qty 200

## 2017-05-07 MED ORDER — AMLODIPINE BESYLATE 5 MG PO TABS
5.0000 mg | ORAL_TABLET | Freq: Two times a day (BID) | ORAL | Status: DC
  Administered 2017-05-07 – 2017-05-15 (×17): 5 mg via ORAL
  Filled 2017-05-07 (×17): qty 1

## 2017-05-07 MED ORDER — IPRATROPIUM-ALBUTEROL 0.5-2.5 (3) MG/3ML IN SOLN: 3.0000 mL | Freq: Four times a day (QID) | RESPIRATORY_TRACT | Status: DC | PRN

## 2017-05-07 MED ORDER — METOPROLOL TARTRATE 25 MG PO TABS
25.0000 mg | ORAL_TABLET | Freq: Two times a day (BID) | ORAL | Status: DC
  Administered 2017-05-07 (×2): 25 mg via ORAL
  Filled 2017-05-07 (×2): qty 1

## 2017-05-07 MED ORDER — SODIUM BICARBONATE 8.4 % IV SOLN
INTRAVENOUS | Status: AC
  Filled 2017-05-07: qty 100

## 2017-05-07 MED ORDER — LISINOPRIL 10 MG PO TABS: 10.0000 mg | ORAL_TABLET | Freq: Every day | ORAL | Status: DC

## 2017-05-07 MED ORDER — NITROGLYCERIN 0.4 MG SL SUBL
0.4000 mg | SUBLINGUAL_TABLET | SUBLINGUAL | Status: DC | PRN
  Administered 2017-05-11 (×2): 0.4 mg via SUBLINGUAL
  Filled 2017-05-07 (×2): qty 1

## 2017-05-07 MED ORDER — SODIUM BICARBONATE 8.4 % IV SOLN: 100.0000 meq | Freq: Once | INTRAVENOUS | Status: DC

## 2017-05-07 MED ORDER — SODIUM BICARBONATE 8.4 % IV SOLN
100.0000 meq | Freq: Once | INTRAVENOUS | Status: AC
  Administered 2017-05-07: 100 meq via INTRAVENOUS

## 2017-05-07 MED ORDER — FUROSEMIDE 20 MG PO TABS: 20.0000 mg | ORAL_TABLET | Freq: Two times a day (BID) | ORAL | Status: DC

## 2017-05-07 MED ORDER — ACETAMINOPHEN 325 MG PO TABS: 650.0000 mg | ORAL_TABLET | ORAL | Status: DC | PRN

## 2017-05-07 MED ORDER — LORATADINE 10 MG PO TABS
10.0000 mg | ORAL_TABLET | Freq: Every day | ORAL | Status: DC
  Administered 2017-05-08 – 2017-05-15 (×8): 10 mg via ORAL
  Filled 2017-05-07 (×8): qty 1

## 2017-05-07 MED ORDER — CITALOPRAM HYDROBROMIDE 10 MG PO TABS
10.0000 mg | ORAL_TABLET | Freq: Every day | ORAL | Status: DC
  Administered 2017-05-08 – 2017-05-15 (×8): 10 mg via ORAL
  Filled 2017-05-07 (×8): qty 1

## 2017-05-07 MED ORDER — GUAIFENESIN ER 600 MG PO TB12
600.0000 mg | ORAL_TABLET | Freq: Two times a day (BID) | ORAL | Status: DC | PRN
  Administered 2017-05-07: 600 mg via ORAL
  Filled 2017-05-07: qty 1

## 2017-05-07 MED ORDER — CLOPIDOGREL BISULFATE 75 MG PO TABS
75.0000 mg | ORAL_TABLET | Freq: Every day | ORAL | Status: DC
  Administered 2017-05-08 – 2017-05-15 (×8): 75 mg via ORAL
  Filled 2017-05-07 (×8): qty 1

## 2017-05-07 MED ORDER — MOMETASONE FURO-FORMOTEROL FUM 200-5 MCG/ACT IN AERO
2.0000 | INHALATION_SPRAY | Freq: Two times a day (BID) | RESPIRATORY_TRACT | Status: DC
  Administered 2017-05-07 – 2017-05-10 (×7): 2 via RESPIRATORY_TRACT
  Filled 2017-05-07: qty 8.8

## 2017-05-07 NOTE — Progress Notes (Signed)
pt arrived from ED around 0100 on 4L o2, then put pt on CPAP since pt does have OSA. around 0400 pt woke up needing to use the bathroom, pt having diarrhea. during that time pt started feeling nauseated & felt like he could not breathe. this RN gave Zofran but still felt like he couldn't breathe that well pt states " I'm drowning" sats on his telemetry box showed he was in the 90's however central telemetry called saying he was showing his sats in the 60's. we checked on the dinamap and sats also said they were in the 60's while pt was on the cpap/automated bipap. Respiratory then initiated the bipap. MD was paged. Dr. pyreddy to order 40mg  IV Lasix, ABG, CXR, continuous bipap and transfer to the unit.

## 2017-05-07 NOTE — Progress Notes (Signed)
Patient was transitioned over to bipap due to respiratory distress/low sats. ABG obtained as well as cxr and ekg.  Patient tolerated all well. Awaiting transfer to icu.

## 2017-05-07 NOTE — Progress Notes (Signed)
Called by RN taking care of patient for shortness of breath. Patient desaturated to 60 %. Put on BIPAP. IV lasix ordered for pulmonary edema and congestion confirmed on cxr and currently on treatment for NON STEMI. ABG reviewed. Intensivist consult done for respiratory failure. Patient will be transferred to ICU for management of respiratory failure, NON STEMI , hypoxia.

## 2017-05-07 NOTE — Progress Notes (Signed)
PHARMACY NOTE -  ANTIBIOTIC RENAL DOSE ADJUSTMENT   Request received for Pharmacy to assist with antibiotic renal dose adjustment.  Patient has been initiated on ciprofloxacin for salmonella. SCr 2.32, estimated CrCl 27.9 ml/min Will adjust dosing to ciprofloxacin 400 mg iv q 24 hours. Will continue to follow and adjust dosing as indicated.   Ulice Dash, PharmD Clinical Pharmacist   .

## 2017-05-07 NOTE — Progress Notes (Signed)
[facility] NAME: Mike Decker    MR#:  [id number]  DATE OF BIRTH:  1941/10/30  SUBJECTIVE:  CHIEF COMPLAINT:   Chief Complaint  Patient presents with  . Diarrhea  . Emesis     Came with GI symptoms, SOB.   Have High troponin, on heparin drip.   Required bipap in night.  REVIEW OF SYSTEMS:  CONSTITUTIONAL: No fever, fatigue or weakness.  EYES: No blurred or double vision.  EARS, NOSE, AND THROAT: No tinnitus or ear pain.  RESPIRATORY: No cough, positive for shortness of breath,no wheezing or hemoptysis.  CARDIOVASCULAR: No chest pain, orthopnea, edema.  GASTROINTESTINAL: No nausea, vomiting, diarrhea or abdominal pain.  GENITOURINARY: No dysuria, hematuria.  ENDOCRINE: No polyuria, nocturia,  HEMATOLOGY: No anemia, easy bruising or bleeding SKIN: No rash or lesion. MUSCULOSKELETAL: No joint pain or arthritis.   NEUROLOGIC: No tingling, numbness, weakness.  PSYCHIATRY: No anxiety or depression.   ROS  DRUG ALLERGIES:   Allergies  Allergen Reactions  . Peanut-Containing Drug Products Anaphylaxis  . Penicillins Hives and Rash    Has patient had a PCN reaction causing immediate rash, facial/tongue/throat swelling, SOB or lightheadedness with hypotension: Yes Has patient had a PCN reaction causing severe rash involving mucus membranes or skin necrosis: No Has patient had a PCN reaction that required hospitalization No Has patient had a PCN reaction occurring within the last 10 years: No If all of the above answers are "NO", then may proceed with Cephalosporin use.  . Bee Venom Swelling  . Influenza Vaccines Hives  . Inh [Isoniazid] Hives  . Kenalog [Triamcinolone Acetonide] Hives  . Nalfon [Fenoprofen Calcium] Hives    VITALS:  Blood pressure (!) 123/55, pulse 68, temperature 99 F (37.2 C), resp. rate (!) 25, height 5\' 7"  (1.702 m), weight 86.2 kg (190 lb 0.6 oz), SpO2 94 %.  PHYSICAL EXAMINATION:  GENERAL:  76  y.o.-year-old patient lying in the bed with no acute distress.  EYES: Pupils equal, round, reactive to light and accommodation. No scleral icterus. Extraocular muscles intact.  HEENT: Head atraumatic, normocephalic. Oropharynx and nasopharynx clear.  NECK:  Supple, no jugular venous distention. No thyroid enlargement, no tenderness.  LUNGS: Normal breath sounds bilaterally, no wheezing, bilateral crepitation. No use of accessory muscles of respiration.  CARDIOVASCULAR: S1, S2 normal. No murmurs, rubs, or gallops.  ABDOMEN: Soft, nontender, nondistended. Bowel sounds present. No organomegaly or mass.  EXTREMITIES: No pedal edema, cyanosis, or clubbing.  NEUROLOGIC: Cranial nerves II through XII are intact. Muscle strength 5/5 in all extremities. Sensation intact. Gait not checked.  PSYCHIATRIC: The patient is alert and oriented x 3.  SKIN: No obvious rash, lesion, or ulcer.   Physical Exam LABORATORY PANEL:   CBC  Recent Labs Lab 05/07/17 0637  WBC 15.1*  HGB 11.6*  HCT 35.0*  PLT 208   ------------------------------------------------------------------------------------------------------------------  Chemistries   Recent Labs Lab 05/06/17 1901 05/07/17 0356 05/07/17 0637  NA 135  --  136  K 3.5  --  4.3  CL 101  --  103  CO2 22  --  20*  GLUCOSE 120*  --  238*  BUN 37*  --  44*  CREATININE 1.99*  --  2.32*  CALCIUM 8.4*  --  7.9*  MG  --  1.7  --   AST 24  --   --   ALT 13*  --   --   ALKPHOS 69  --   --  BILITOT 0.7  --   --    ------------------------------------------------------------------------------------------------------------------  Cardiac Enzymes  Recent Labs Lab 05/07/17 0356 05/07/17 0950  TROPONINI 15.97* 21.51*   ------------------------------------------------------------------------------------------------------------------  RADIOLOGY:  Ct Angio Chest Pe W Or Wo Contrast  Result Date: 05/06/2017 CLINICAL DATA:  Tachypneic.  Left lower  quadrant abdominal pain. EXAM: CT ANGIOGRAPHY CHEST CT ABDOMEN AND PELVIS WITH CONTRAST TECHNIQUE: Multidetector CT imaging of the chest was performed using the standard protocol during bolus administration of intravenous contrast. Multiplanar CT image reconstructions and MIPs were obtained to evaluate the vascular anatomy. Multidetector CT imaging of the abdomen and pelvis was performed using the standard protocol during bolus administration of intravenous contrast. CONTRAST:  68mL ISOVUE-300 IOPAMIDOL (ISOVUE-300) INJECTION 61% COMPARISON:  CT scan of May 02, 2016 and April 06, 2014. FINDINGS: CTA CHEST FINDINGS Cardiovascular: Satisfactory opacification of the pulmonary arteries to the segmental level. No evidence of pulmonary embolism. Normal heart size. No pericardial effusion. Atherosclerosis of thoracic aorta is noted without aneurysm. Coronary artery calcifications are noted. Mediastinum/Nodes: No enlarged mediastinal, hilar, or axillary lymph nodes. Thyroid gland, trachea, and esophagus demonstrate no significant findings. Lungs/Pleura: No pneumothorax is noted. Minimal bilateral pleural effusions are noted. Bilateral interstitial densities are noted throughout both lungs most consistent with pulmonary edema. Musculoskeletal: No chest wall abnormality. No acute or significant osseous findings. Review of the MIP images confirms the above findings. CT ABDOMEN and PELVIS FINDINGS Hepatobiliary: Dilated gallbladder is noted but no cholelithiasis or inflammatory changes are noted. 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 Pancreas: Unremarkable. No pancreatic ductal dilatation or surrounding inflammatory changes. Spleen: Normal in size without focal abnormality. Adrenals/Urinary Tract: Adrenal glands are unremarkable. Kidneys are normal, without renal calculi, focal lesion, or hydronephrosis. Bladder is unremarkable. Stomach/Bowel: Stomach is within normal limits. Appendix appears normal. No  evidence of bowel wall thickening, distention, or inflammatory changes. Sigmoid diverticulosis is noted. Vascular/Lymphatic: Aortic atherosclerosis. No enlarged abdominal or pelvic lymph nodes. 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 Reproductive: Status post prostatectomy. Other: Moderate size fat containing right inguinal hernia is noted. Musculoskeletal: Stable left acetabular enostosis. No other significant abnormality seen. Review of the MIP images confirms the above findings. IMPRESSION: No definite evidence pulmonary embolus. Aortic atherosclerosis. Coronary calcifications are noted suggesting coronary artery disease. Bilateral pulmonary edema is noted with minimal pleural effusions. Moderate size fat containing right inguinal hernia. 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 Dilated gallbladder is noted without evidence of inflammation. No cholelithiasis is noted. Electronically Signed   By: Marijo Conception, M.D.   On: 05/06/2017 21:10   Ct Abdomen Pelvis W Contrast  Result Date: 05/06/2017 CLINICAL DATA:  Tachypneic.  Left lower quadrant abdominal pain. EXAM: CT ANGIOGRAPHY CHEST CT ABDOMEN AND PELVIS WITH CONTRAST TECHNIQUE: Multidetector CT imaging of the chest was performed using the standard protocol during bolus administration of intravenous contrast. Multiplanar CT image reconstructions and MIPs were obtained to evaluate the vascular anatomy. Multidetector CT imaging of the abdomen and pelvis was performed using the standard protocol during bolus administration of intravenous contrast. CONTRAST:  87mL ISOVUE-300 IOPAMIDOL (ISOVUE-300) INJECTION 61% COMPARISON:  CT scan of May 02, 2016 and April 06, 2014. FINDINGS: CTA CHEST FINDINGS Cardiovascular: Satisfactory opacification of the pulmonary arteries to the segmental level. No evidence of pulmonary embolism. Normal heart size. No pericardial effusion. Atherosclerosis  of thoracic aorta is noted without aneurysm. Coronary artery calcifications are noted. Mediastinum/Nodes: No enlarged mediastinal, hilar, or axillary  lymph nodes. Thyroid gland, trachea, and esophagus demonstrate no significant findings. Lungs/Pleura: No pneumothorax is noted. Minimal bilateral pleural effusions are noted. Bilateral interstitial densities are noted throughout both lungs most consistent with pulmonary edema. Musculoskeletal: No chest wall abnormality. No acute or significant osseous findings. Review of the MIP images confirms the above findings. CT ABDOMEN and PELVIS FINDINGS Hepatobiliary: Dilated gallbladder is noted but no cholelithiasis or inflammatory changes are noted. 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 Pancreas: Unremarkable. No pancreatic ductal dilatation or surrounding inflammatory changes. Spleen: Normal in size without focal abnormality. Adrenals/Urinary Tract: Adrenal glands are unremarkable. Kidneys are normal, without renal calculi, focal lesion, or hydronephrosis. Bladder is unremarkable. Stomach/Bowel: Stomach is within normal limits. Appendix appears normal. No evidence of bowel wall thickening, distention, or inflammatory changes. Sigmoid diverticulosis is noted. Vascular/Lymphatic: Aortic atherosclerosis. No enlarged abdominal or pelvic lymph nodes. 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 Reproductive: Status post prostatectomy. Other: Moderate size fat containing right inguinal hernia is noted. Musculoskeletal: Stable left acetabular enostosis. No other significant abnormality seen. Review of the MIP images confirms the above findings. IMPRESSION: No definite evidence pulmonary embolus. Aortic atherosclerosis. Coronary calcifications are noted suggesting coronary artery disease. Bilateral pulmonary edema is noted with minimal pleural effusions. Moderate size fat containing right inguinal hernia. Aortic atherosclerosis is noted with  stable old dissection involving the infrarenal abdominal aorta. Dilated gallbladder is noted without evidence of inflammation. No cholelithiasis is noted. Electronically Signed   By: Marijo Conception, M.D.   On: 05/06/2017 21:10   Dg Chest Port 1 View  Result Date: 05/07/2017 CLINICAL DATA:  Acute onset of shortness of breath. Initial encounter. EXAM: PORTABLE CHEST 1 VIEW COMPARISON:  Chest radiograph and CTA of the chest performed 05/06/2017 FINDINGS: Vascular congestion is noted, with bilateral central airspace opacification compatible with pulmonary edema. No definite pleural effusion or pneumothorax is seen. The cardiomediastinal silhouette is borderline enlarged. No acute osseous abnormalities identified. IMPRESSION: Vascular congestion and borderline cardiomegaly. New bilateral central airspace opacification is compatible with pulmonary edema. Electronically Signed   By: Garald Balding M.D.   On: 05/07/2017 06:14   Dg Chest Port 1 View  Result Date: 05/06/2017 CLINICAL DATA:  Dyspnea, vomiting and diarrhea x2 days. EXAM: PORTABLE CHEST 1 VIEW COMPARISON:  03/23/2013 FINDINGS: Borderline cardiomegaly. No aortic aneurysm. No pneumonic consolidation or CHF. No effusion or pneumothorax. Mild hyperinflation of the lungs, upper lobe predominant and left greater than right. IMPRESSION: Mild hyperinflation of the lungs, more so in the left upper lobe. Electronically Signed   By: Ashley Royalty M.D.   On: 05/06/2017 19:25    ASSESSMENT AND PLAN:   Active Problems:   NSTEMI (non-ST elevated myocardial infarction) ([facility])  #. NSTEMI, history of coronary artery disease -  with telemetry monitoring, continuous pulse ox - Heparin drip -Continue Lopressor, nitroglycerin, Imdur, Plavix, Crestor - Trend troponins, check lipids and TSH. - Check echo - Cardiology consult requested.  - may not get Cath due to CKD.  #. Hypoxia- ac respi failure, Pulmonary edema with pleural effusion, likely secondary to ac  CHF - Intake/output, daily weight. - Mednebs, O2 therapy as needed - Add on BNP -Continue Lasix - on bipap, get Echo and cardio consult.  #. Abdominal pain, unclear etiology possibly viral gastroenteritis given diarrhea - Checked stool studies - salmonella- on cipro. - Antiemetics and antidiarrheals as needed  #. CKD3, Creatinine near baseline - Monitor BMP as patient had IV contrast - nephrologist on  case.  #. H/o Diabetes - Accuchecks q4h with RISS coverage - Continue Lantus at half dose - CHeck A1c  #. History of COPD -Continue Symbicort, duoneb.   #. History of hypertension -Continue Norvasc, Lopressor, Catapres, wills top lisinopril due to worsening renal func.  #. History of Barrett's esophagus/GERD -Continue Carafate, Protonix  #. History of depression -Continue Celexa  #. History of gout -Continue allopurinol, colchicine  #. History of obstructive sleep apnea -Continue CPAP at night per home regimen   All the records are reviewed and case discussed with Care Management/Social Workerr. Management plans discussed with the patient, family and they are in agreement.  CODE STATUS: Full.  TOTAL TIME TAKING CARE OF THIS PATIENT: 40 minutes.     POSSIBLE D/C IN 2-3 DAYS, DEPENDING ON CLINICAL CONDITION.   Vaughan Basta M.D on 05/07/2017   Between 7am to 6pm - Pager - [phone]  After 6pm go to [email or web address] - password [email or web address] [facility]  Office  [phone]  CC: Primary care physician; Cletis Athens, MD  Note: This dictation was prepared with Dragon dictation along with smaller phrase technology. Any transcriptional errors that result from this process are unintentional.

## 2017-05-07 NOTE — Progress Notes (Signed)
Paged Dr Ubaldo Glassing. He is aware that patients troponin level is elevated. Current troponin 21.51. He will come evaluate patient no additional orders.

## 2017-05-07 NOTE — Consult Note (Signed)
Name: Mike Decker MRN: [id number] DOB: 1941-05-18    ADMISSION DATE:  05/06/2017 CONSULTATION DATE: 05/07/2017  REFERRING MD : Dr. 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  Transferred from telemetry unit to ICU 05/29 with worsening acute respiratory failure requiring continuous Bipap and had a brief period of afibb with RVR   SIGNIFICANT EVENTS  05/28-Pt admitted to the telemetry unit  05/29-Pt transferred to ICU   STUDIES:  CT Abd Pelvis and CT Angio Chest 05/28>>No definite evidence pulmonary embolus. Aortic atherosclerosis. Coronary calcifications are noted suggesting coronary artery disease. Bilateral pulmonary edema is noted with minimal pleural effusions. Moderate size fat containing right inguinal hernia. 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 Dilated gallbladder is noted without evidence of inflammation. No cholelithiasis is noted.   HISTORY OF PRESENT ILLNESS:   This is a 76 yo male with a PMH of Vitreoretinal Degeneration, OSA-Bipap qhs, Skin Cancer, PVD, Ocular Hypertension, Myocardial Infarction, HTN, Hyperlipidemia, Hemangioma, Gout, Diverticulosis, Diabetes Mellitus, CKD Stage III, Colon Polyp, Prostate Cancer (2002), Barrett's Esophagus, Atrioventricular Canal, and Arthritis.  He presented to [facility] ER 05/28 with left lower quadrant abdominal pain and diarrhea onset of symptoms 05/25.  Per ER notes he has had diverticulitis flare-ups in the past and is normally prescribed Cipro and Flagyl, therefore he notified PCP and was prescribed Cipro. He has taken a total of 3 doses of Cipro, however his symptoms worsened prompting visit to the ER.  The pt notified EMS due to symptoms and per EMS the pt developed central chest pain  with associated shortness of breath, nausea, and anxiety.  Upon arrival to the ER the chest pain subsided, however it was noted the pt was hypoxic with O2 sats 77%, therefore he was placed on 4L via nasal canula. In the ER troponin's were elevated at 0.34 and 5.27 EKG revealing NSR 92 bpm with normal axis, ST depression in V3, V4, V6 and nonspecific ST-T wave changes.  Therefore, he was subsequently admitted to the telemetry unit by hospitalist team for further workup and management.  On 05/29 he required transfer to ICU due to worsening acute on chronic hypoxic respiratory failure secondary to pulmonary edema and AECOPD requiring continuous Bipap, significant increase in troponin at 15.97, and brief period of afibb with rvr.    PAST MEDICAL HISTORY :   has a past medical history of Arthritis; Atrioventricular canal (AVC); Barrett esophagus; Cancer ([place]) (2002); Colon polyp; Diabetes mellitus without complication ([place]); Diverticulosis; Gout; Heart disease; Hemangioma; Hyperlipidemia; Hypertension; Myocardial infarct ([place]); Ocular hypertension; Peripheral vascular disease ([place]); Skin cancer; Sleep apnea; and Vitreoretinal degeneration.  has a past surgical history that includes Prostate surgery (2002); Coronary angioplasty with stent (2012); Intraocular lens insertion; Hernia repair; Cataract extraction (2011, 2012); Rotator cuff repair (Right); Nose surgery; and Esophagogastroduodenoscopy (egd) with propofol (N/A, 06/01/2016). Prior to Admission medications   Medication Sig Start Date End Date Taking? Authorizing Provider  acetaminophen (TYLENOL) 500 MG tablet Take 1,000 mg by mouth every 6 (six) hours as needed for mild pain, moderate pain or headache.   Yes [provider]  allopurinol (ZYLOPRIM) 100 MG tablet Take 200 mg by mouth 2 (two) times daily.    Yes [provider]  amLODipine (NORVASC) 5 MG tablet TAKE 1 TABLET (5 MG TOTAL) BY MOUTH  2 (TWO) TIMES DAILY. 04/02/17  Yes [provider]  aspirin (ASPIRIN CHILDRENS) 81 MG chewable tablet Chew 1 tablet (81 mg total) by mouth daily. 10/09/15  Yes Mody, Ulice Bold, MD  budesonide-formoterol (SYMBICORT) 160-4.5 MCG/ACT inhaler Inhale 2 puffs into the lungs 2 (two) times daily.   Yes [provider]  cetirizine (ZYRTEC) 5 MG tablet Take 5 mg by mouth daily as needed for allergies or rhinitis.    Yes [provider]  Cholecalciferol (VITAMIN D3) 50000 UNITS CAPS Take 50,000 Units by mouth every 30 (thirty) days.   Yes [provider]  ciprofloxacin (CIPRO) 500 MG tablet Take 500 mg by mouth 2 (two) times daily. 05/05/17 05/14/17 Yes [provider]  citalopram (CELEXA) 10 MG tablet Take 10 mg by mouth daily.   Yes [provider]  cloNIDine (CATAPRES) 0.1 MG tablet Take 1 tablet (0.1 mg total) by mouth 2 (two) times daily. Patient taking differently: Take 0.1 mg by mouth at bedtime.  10/09/15  Yes Mody, Ulice Bold, MD  clopidogrel (PLAVIX) 75 MG tablet Take 1 tablet (75 mg total) by mouth daily. 10/09/15  Yes Mody, Ulice Bold, MD  colchicine 0.6 MG tablet Take 0.6 mg by mouth 3 (three) times daily.   Yes [provider]  dextromethorphan-guaiFENesin (MUCINEX DM) 30-600 MG 12hr tablet Take 1 tablet by mouth 2 (two) times daily as needed for cough.   Yes [provider]  EPINEPHrine (EPI-PEN) 0.3 mg/0.3 mL SOAJ injection Inject into the muscle as needed (anaphylaxis).    Yes [provider]  furosemide (LASIX) 20 MG tablet Take 20 mg by mouth 2 (two) times daily.    Yes [provider]  insulin aspart (NOVOLOG FLEXPEN) 100 UNIT/ML FlexPen Inject 14-20 Units into the skin See admin instructions. Inject 14 units every morning with breakfast and follow sliding scale for the rest of meals. 08/30/15  Yes [provider]  insulin glargine (LANTUS) 100 UNIT/ML injection Inject 55 Units into the skin at bedtime.    Yes [provider]  isosorbide  mononitrate (IMDUR) 120 MG 24 hr tablet Take 1 tablet (120 mg total) by mouth daily. Patient taking differently: Take 60 mg by mouth daily.  10/09/15  Yes Mody, Ulice Bold, MD  lisinopril (PRINIVIL,ZESTRIL) 10 MG tablet Take 10 mg by mouth daily.   Yes [provider]  metoprolol (LOPRESSOR) 50 MG tablet Take 25 mg by mouth 2 (two) times daily.    Yes [provider]  metroNIDAZOLE (FLAGYL) 250 MG tablet Take 250 mg by mouth 3 (three) times daily. 05/05/17 05/14/17 Yes [provider]  nitroGLYCERIN (NITROSTAT) 0.4 MG SL tablet Place 0.4 mg under the tongue every 5 (five) minutes x 3 doses as needed for chest pain.    Yes [provider]  pantoprazole (PROTONIX) 40 MG tablet Take 40 mg by mouth 2 (two) times daily.   Yes [provider]  polyethylene glycol powder (GLYCOLAX/MIRALAX) powder Use as directed 01/20/15  Yes [provider]  rosuvastatin (CRESTOR) 40 MG tablet Take 40 mg by mouth daily.   Yes [provider]  CARAFATE 1 GM/10ML suspension TAKE 2 TEASPOONSFUL (10MLS) BY MOUTH 4 TIMES A DAY BEFORE MEALS AND NIGHTLY FOR 14 DAYS 03/17/17   [provider]   Allergies  Allergen Reactions  . Peanut-Containing Drug Products Anaphylaxis  . Penicillins Hives and Rash    Has patient had a PCN reaction causing immediate rash, facial/tongue/throat swelling, SOB or lightheadedness with hypotension: Yes Has patient  had a PCN reaction causing severe rash involving mucus membranes or skin necrosis: No Has patient had a PCN reaction that required hospitalization No Has patient had a PCN reaction occurring within the last 10 years: No If all of the above answers are "NO", then may proceed with Cephalosporin use.  . Bee Venom Swelling  . Influenza Vaccines Hives  . Inh [Isoniazid] Hives  . Kenalog [Triamcinolone Acetonide] Hives  . Nalfon [Fenoprofen Calcium] Hives    FAMILY HISTORY:  family history includes Stroke in his maternal  grandfather. SOCIAL HISTORY:  reports that he quit smoking about 40 years ago. His smoking use included Cigarettes. He quit after 20.00 years of use. He has never used smokeless tobacco. He reports that he does not drink alcohol or use drugs.  REVIEW OF SYSTEMS:   Unable to assess pt on continuous Bipap, however denies chest pain  SUBJECTIVE:  Pt tachypneic on continuous Bipap   VITAL SIGNS: Temp:  [99.2 F (37.3 C)-102.6 F (39.2 C)] 99.9 F (37.7 C) (05/29 0452) Pulse Rate:  [82-116] 88 (05/29 0435) Resp:  [17-32] 20 (05/29 0435) BP: (135-175)/(50-84) 137/58 (05/29 0435) SpO2:  [84 %-100 %] 91 % (05/29 0435) Weight:  [86.6 kg (190 lb 14.4 oz)-87.5 kg (193 lb)] 86.6 kg (190 lb 14.4 oz) (05/29 0046)  PHYSICAL EXAMINATION: General: well developed, well nourished Caucasian male, in respiratory distress Neuro: alert and oriented, follows commands HEENT: supple, JVD present  Cardiovascular: nsr, s1s2, no M/R/G Lungs: diffuse crackles with expiratory wheezes throughout, labored on continuous Bipap Abdomen: +BS x4, soft, obese, non distended, non tender Musculoskeletal: normal bulk and tone, no edema  Skin: intact no rashes or lesions    Recent Labs Lab 05/06/17 1901  NA 135  K 3.5  CL 101  CO2 22  BUN 37*  CREATININE 1.99*  GLUCOSE 120*    Recent Labs Lab 05/06/17 1901  HGB 12.2*  HCT 36.7*  WBC 11.7*  PLT 203   Ct Angio Chest Pe W Or Wo Contrast  Result Date: 05/06/2017 CLINICAL DATA:  Tachypneic.  Left lower quadrant abdominal pain. EXAM: CT ANGIOGRAPHY CHEST CT ABDOMEN AND PELVIS WITH CONTRAST TECHNIQUE: Multidetector CT imaging of the chest was performed using the standard protocol during bolus administration of intravenous contrast. Multiplanar CT image reconstructions and MIPs were obtained to evaluate the vascular anatomy. Multidetector CT imaging of the abdomen and pelvis was performed using the standard protocol during bolus administration of intravenous  contrast. CONTRAST:  78mL ISOVUE-300 IOPAMIDOL (ISOVUE-300) INJECTION 61% COMPARISON:  CT scan of May 02, 2016 and April 06, 2014. FINDINGS: CTA CHEST FINDINGS Cardiovascular: Satisfactory opacification of the pulmonary arteries to the segmental level. No evidence of pulmonary embolism. Normal heart size. No pericardial effusion. Atherosclerosis of thoracic aorta is noted without aneurysm. Coronary artery calcifications are noted. Mediastinum/Nodes: No enlarged mediastinal, hilar, or axillary lymph nodes. Thyroid gland, trachea, and esophagus demonstrate no significant findings. Lungs/Pleura: No pneumothorax is noted. Minimal bilateral pleural effusions are noted. Bilateral interstitial densities are noted throughout both lungs most consistent with pulmonary edema. Musculoskeletal: No chest wall abnormality. No acute or significant osseous findings. Review of the MIP images confirms the above findings. CT ABDOMEN and PELVIS FINDINGS Hepatobiliary: Dilated gallbladder is noted but no cholelithiasis or inflammatory changes are noted. 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 Pancreas: Unremarkable. No pancreatic ductal dilatation or surrounding inflammatory changes. Spleen: Normal in size without focal abnormality. Adrenals/Urinary Tract: Adrenal glands are unremarkable. Kidneys are normal, without renal  calculi, focal lesion, or hydronephrosis. Bladder is unremarkable. Stomach/Bowel: Stomach is within normal limits. Appendix appears normal. No evidence of bowel wall thickening, distention, or inflammatory changes. Sigmoid diverticulosis is noted. Vascular/Lymphatic: Aortic atherosclerosis. No enlarged abdominal or pelvic lymph nodes. 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 Reproductive: Status post prostatectomy. Other: Moderate size fat containing right inguinal hernia is noted. Musculoskeletal: Stable left acetabular enostosis. No other significant abnormality seen. Review of  the MIP images confirms the above findings. IMPRESSION: No definite evidence pulmonary embolus. Aortic atherosclerosis. Coronary calcifications are noted suggesting coronary artery disease. Bilateral pulmonary edema is noted with minimal pleural effusions. Moderate size fat containing right inguinal hernia. 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 Dilated gallbladder is noted without evidence of inflammation. No cholelithiasis is noted. Electronically Signed   By: Marijo Conception, M.D.   On: 05/06/2017 21:10   Ct Abdomen Pelvis W Contrast  Result Date: 05/06/2017 CLINICAL DATA:  Tachypneic.  Left lower quadrant abdominal pain. EXAM: CT ANGIOGRAPHY CHEST CT ABDOMEN AND PELVIS WITH CONTRAST TECHNIQUE: Multidetector CT imaging of the chest was performed using the standard protocol during bolus administration of intravenous contrast. Multiplanar CT image reconstructions and MIPs were obtained to evaluate the vascular anatomy. Multidetector CT imaging of the abdomen and pelvis was performed using the standard protocol during bolus administration of intravenous contrast. CONTRAST:  75mL ISOVUE-300 IOPAMIDOL (ISOVUE-300) INJECTION 61% COMPARISON:  CT scan of May 02, 2016 and April 06, 2014. FINDINGS: CTA CHEST FINDINGS Cardiovascular: Satisfactory opacification of the pulmonary arteries to the segmental level. No evidence of pulmonary embolism. Normal heart size. No pericardial effusion. Atherosclerosis of thoracic aorta is noted without aneurysm. Coronary artery calcifications are noted. Mediastinum/Nodes: No enlarged mediastinal, hilar, or axillary lymph nodes. Thyroid gland, trachea, and esophagus demonstrate no significant findings. Lungs/Pleura: No pneumothorax is noted. Minimal bilateral pleural effusions are noted. Bilateral interstitial densities are noted throughout both lungs most consistent with pulmonary edema. Musculoskeletal: No chest wall  abnormality. No acute or significant osseous findings. Review of the MIP images confirms the above findings. CT ABDOMEN and PELVIS FINDINGS Hepatobiliary: Dilated gallbladder is noted but no cholelithiasis or inflammatory changes are noted. 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 Pancreas: Unremarkable. No pancreatic ductal dilatation or surrounding inflammatory changes. Spleen: Normal in size without focal abnormality. Adrenals/Urinary Tract: Adrenal glands are unremarkable. Kidneys are normal, without renal calculi, focal lesion, or hydronephrosis. Bladder is unremarkable. Stomach/Bowel: Stomach is within normal limits. Appendix appears normal. No evidence of bowel wall thickening, distention, or inflammatory changes. Sigmoid diverticulosis is noted. Vascular/Lymphatic: Aortic atherosclerosis. No enlarged abdominal or pelvic lymph nodes. 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 Reproductive: Status post prostatectomy. Other: Moderate size fat containing right inguinal hernia is noted. Musculoskeletal: Stable left acetabular enostosis. No other significant abnormality seen. Review of the MIP images confirms the above findings. IMPRESSION: No definite evidence pulmonary embolus. Aortic atherosclerosis. Coronary calcifications are noted suggesting coronary artery disease. Bilateral pulmonary edema is noted with minimal pleural effusions. Moderate size fat containing right inguinal hernia. 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 Dilated gallbladder is noted without evidence of inflammation. No cholelithiasis is noted. Electronically Signed   By: Marijo Conception, M.D.   On: 05/06/2017 21:10   Dg Chest Port 1 View  Result Date: 05/06/2017 CLINICAL DATA:  Dyspnea, vomiting and diarrhea x2 days. EXAM: PORTABLE CHEST 1 VIEW COMPARISON:  03/23/2013  FINDINGS: Borderline cardiomegaly. No aortic aneurysm. No pneumonic  consolidation or CHF. No effusion or pneumothorax. Mild hyperinflation of the lungs, upper lobe predominant and left greater than right. IMPRESSION: Mild hyperinflation of the lungs, more so in the left upper lobe. Electronically Signed   By: Ashley Royalty M.D.   On: 05/06/2017 19:25    ASSESSMENT / PLAN: 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 and pulmonary edema Compensated metabolic acidosis  Afibb with RVR-resolved  NSTEMI Acute on chronic renal failure Nausea and Diarrhea  Hx: OSA (Bipap qhs), CKD Stage III, Diverticulosis, Barrett Esophagus, HTN, Hyperlipidemia, HTN, and MI  P: Continuous Bipap for now wean as tolerated then Bipap qhs  Maintain O2 sats 88% to 92% Scheduled bronchodilator therapy IV lasix 2 amps Sodium Bicarb  CXR in am  Stat EKG  Trend troponin's Lipid panel pending  Continue aspirin, catapres, plavix, lasix, imdur, lisinopril, and lopressor Prn iv morphine for pain management  Cardiology consulted appreciate input Continue heparin gtt dosing per pharmacy Trend CBC's Trend BMP's Replace electrolytes as indicated Monitor uop Nephrology consulted appreciate input  Monitor for s/sx of bleeding Transfuse for hgb <7 Cdiff and GI panel pending Protonix for PUD prophylaxis  Follow cultures  SSI and lantus qhs   Marda Stalker, [place] Pager [phone] (please enter 7 digits) PCCM Consult Pager [phone] (please enter 7 digits)   Pt seen and examined with NP, agree with findings, assessment, plan. CT and CXR images personally reviewed; consistent with pulmonary edema.  Pt presented with dyspnea after experiencing diarrhea. He has a history of COPD, OSA, CKD and follows with nephrology.  On exam he has bilateral diffuse rhonchi.  Labs and vitals are consistent with acute diastolic CHF with NSTEMI, likely demand ischemia, with AKI on CKD, possibly initiated by salmonella gastroenteritis.  Continue heparin,  cardiology and nephrology consulted.  Will diurese. Continue on bipap for acute hypoxic respiratory failure and acute pulm edema.   Marda Stalker, M.D.  05/07/2017

## 2017-05-07 NOTE — Progress Notes (Signed)
Mike Decker, Mike Decker 05/07/17  Subjective:   Patient known to our practice from outpatient. He is followed by Dr Juleen China Patient presented to ER with c/o diarrhea, vomiting for 2 days. He was prescribed Flagyl and Cipro as outpatient. He was noted to be hypoxic in ER and was placed on supplemental Oxygen. He underwent CT of chest, abdomen and pelvis with IV Contrast. No PE was noted. 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 Admission creatinine 1.99 which has increased to 2.32 today Also noted to have + troponin; Non-STEMI  Objective:  Vital signs in last 24 hours:  Temp:  [99 F (37.2 C)-102.6 F (39.2 C)] 99 F (37.2 C) (05/29 0700) Pulse Rate:  [71-116] 71 (05/29 0800) Resp:  [17-32] 31 (05/29 0800) BP: (113-175)/(47-84) 118/49 (05/29 0800) SpO2:  [84 %-100 %] 93 % (05/29 0800) FiO2 (%):  [90 %] 90 % (05/29 0751) Weight:  [86.2 kg (190 lb 0.6 oz)-87.5 kg (193 lb)] 86.2 kg (190 lb 0.6 oz) (05/29 0615)  Weight change:  Filed Weights   05/06/17 1857 05/07/17 0046 05/07/17 0615  Weight: 87.5 kg (193 lb) 86.6 kg (190 lb 14.4 oz) 86.2 kg (190 lb 0.6 oz)    Intake/Output:    Intake/Output Summary (Last 24 hours) at 05/07/17 0240 Last data filed at 05/07/17 0217  Gross per 24 hour  Intake            49.32 ml  Output              250 ml  Net          -200.68 ml     Physical Exam: General: NAD, laying in bed  HEENT NIPPV mask in place  Neck supple  Pulm/lungs Minimal basilar crackles and wheezing  CVS/Heart Regular, no rub  Abdomen:  Soft, Non tender  Extremities: No peripheral edema  Neurologic: Alert, oriented  Skin: No rashes          Basic Metabolic Panel:   Recent Labs Lab 05/06/17 1901 05/07/17 0356 05/07/17 0637  NA 135  --  136  K 3.5  --  4.3  CL 101  --  103  CO2 22  --  20*  GLUCOSE 120*  --  238*  BUN 37*  --  44*  CREATININE 1.99*  --  2.32*  CALCIUM 8.4*  --   7.9*  MG  --  1.7  --      CBC:  Recent Labs Lab 05/06/17 1901 05/07/17 0637  WBC 11.7* 15.1*  NEUTROABS 10.4*  --   HGB 12.2* 11.6*  HCT 36.7* 35.0*  MCV 89.9 88.0  PLT 203 208     No results found for: HEPBSAG, HEPBSAB, HEPBIGM    Microbiology:  Recent Results (from the past 240 hour(s))  Blood Culture (routine x 2)     Status: None (Preliminary result)   Collection Time: 05/06/17  7:02 PM  Result Value Ref Range Status   Specimen Description BLOOD RIGHT HAND  Final   Special Requests   Final    BOTTLES DRAWN AEROBIC AND ANAEROBIC Blood Culture adequate volume   Culture NO GROWTH < 12 HOURS  Final   Report Status PENDING  Incomplete  Blood Culture (routine x 2)     Status: None (Preliminary result)   Collection Time: 05/06/17  7:02 PM  Result Value Ref Range Status   Specimen Description BLOOD LEFT [facility]  Final   Special Requests  Final    BOTTLES DRAWN AEROBIC AND ANAEROBIC Blood Culture adequate volume   Culture NO GROWTH < 12 HOURS  Final   Report Status PENDING  Incomplete  Gastrointestinal Panel by PCR , Stool     Status: Abnormal   Collection Time: 05/07/17  4:44 AM  Result Value Ref Range Status   Campylobacter species NOT DETECTED NOT DETECTED Final   Plesimonas shigelloides NOT DETECTED NOT DETECTED Final   Salmonella species DETECTED (A) NOT DETECTED Final    Comment: RESULT CALLED TO, READ BACK BY AND VERIFIED WITH: BETH BUONO 05/07/17 0642 ALV    Yersinia enterocolitica NOT DETECTED NOT DETECTED Final   Vibrio species NOT DETECTED NOT DETECTED Final   Vibrio cholerae NOT DETECTED NOT DETECTED Final   Enteroaggregative E coli (EAEC) NOT DETECTED NOT DETECTED Final   Enteropathogenic E coli (EPEC) NOT DETECTED NOT DETECTED Final   Enterotoxigenic E coli (ETEC) NOT DETECTED NOT DETECTED Final   Shiga like toxin producing E coli (STEC) NOT DETECTED NOT DETECTED Final   E. coli O157 NOT DETECTED NOT DETECTED Final   Shigella/Enteroinvasive E coli  (EIEC) NOT DETECTED NOT DETECTED Final   Cryptosporidium NOT DETECTED NOT DETECTED Final   Cyclospora cayetanensis NOT DETECTED NOT DETECTED Final   Entamoeba histolytica NOT DETECTED NOT DETECTED Final   Giardia lamblia NOT DETECTED NOT DETECTED Final   Adenovirus F40/41 NOT DETECTED NOT DETECTED Final   Astrovirus NOT DETECTED NOT DETECTED Final   Norovirus GI/GII NOT DETECTED NOT DETECTED Final   Rotavirus A NOT DETECTED NOT DETECTED Final   Sapovirus (I, II, IV, and V) NOT DETECTED NOT DETECTED Final  C difficile quick scan w PCR reflex     Status: None   Collection Time: 05/07/17  4:44 AM  Result Value Ref Range Status   C Diff antigen NEGATIVE NEGATIVE Final   C Diff toxin NEGATIVE NEGATIVE Final   C Diff interpretation No C. difficile detected.  Final  MRSA PCR Screening     Status: None   Collection Time: 05/07/17  6:24 AM  Result Value Ref Range Status   MRSA by PCR NEGATIVE NEGATIVE Final    Comment:        The GeneXpert MRSA Assay (FDA approved for NASAL specimens only), is one component of a comprehensive MRSA colonization surveillance program. It is not intended to diagnose MRSA infection nor to guide or monitor treatment for MRSA infections.     Coagulation Studies:  Recent Labs  05/07/17 0637  LABPROT 14.2  INR 1.10    Urinalysis:  Recent Labs  05/06/17 1918  COLORURINE YELLOW*  LABSPEC 1.012  PHURINE 5.0  GLUCOSEU NEGATIVE  HGBUR MODERATE*  BILIRUBINUR NEGATIVE  KETONESUR 5*  PROTEINUR 100*  NITRITE NEGATIVE  LEUKOCYTESUR NEGATIVE      Imaging: Ct Angio Chest Pe W Or Wo Contrast  Result Date: 05/06/2017 CLINICAL DATA:  Tachypneic.  Left lower quadrant abdominal pain. EXAM: CT ANGIOGRAPHY CHEST CT ABDOMEN AND PELVIS WITH CONTRAST TECHNIQUE: Multidetector CT imaging of the chest was performed using the standard protocol during bolus administration of intravenous contrast. Multiplanar CT image reconstructions and MIPs were obtained to  evaluate the vascular anatomy. Multidetector CT imaging of the abdomen and pelvis was performed using the standard protocol during bolus administration of intravenous contrast. CONTRAST:  38mL ISOVUE-300 IOPAMIDOL (ISOVUE-300) INJECTION 61% COMPARISON:  CT scan of May 02, 2016 and April 06, 2014. FINDINGS: CTA CHEST FINDINGS Cardiovascular: Satisfactory opacification of the pulmonary arteries to the segmental  level. No evidence of pulmonary embolism. Normal heart size. No pericardial effusion. Atherosclerosis of thoracic aorta is noted without aneurysm. Coronary artery calcifications are noted. Mediastinum/Nodes: No enlarged mediastinal, hilar, or axillary lymph nodes. Thyroid gland, trachea, and esophagus demonstrate no significant findings. Lungs/Pleura: No pneumothorax is noted. Minimal bilateral pleural effusions are noted. Bilateral interstitial densities are noted throughout both lungs most consistent with pulmonary edema. Musculoskeletal: No chest wall abnormality. No acute or significant osseous findings. Review of the MIP images confirms the above findings. CT ABDOMEN and PELVIS FINDINGS Hepatobiliary: Dilated gallbladder is noted but no cholelithiasis or inflammatory changes are noted. 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 Pancreas: Unremarkable. No pancreatic ductal dilatation or surrounding inflammatory changes. Spleen: Normal in size without focal abnormality. Adrenals/Urinary Tract: Adrenal glands are unremarkable. Kidneys are normal, without renal calculi, focal lesion, or hydronephrosis. Bladder is unremarkable. Stomach/Bowel: Stomach is within normal limits. Appendix appears normal. No evidence of bowel wall thickening, distention, or inflammatory changes. Sigmoid diverticulosis is noted. Vascular/Lymphatic: Aortic atherosclerosis. No enlarged abdominal or pelvic lymph nodes. 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 Reproductive: Status post prostatectomy.  Other: Moderate size fat containing right inguinal hernia is noted. Musculoskeletal: Stable left acetabular enostosis. No other significant abnormality seen. Review of the MIP images confirms the above findings. IMPRESSION: No definite evidence pulmonary embolus. Aortic atherosclerosis. Coronary calcifications are noted suggesting coronary artery disease. Bilateral pulmonary edema is noted with minimal pleural effusions. Moderate size fat containing right inguinal hernia. 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 Dilated gallbladder is noted without evidence of inflammation. No cholelithiasis is noted. Electronically Signed   By: Marijo Conception, M.D.   On: 05/06/2017 21:10   Ct Abdomen Pelvis W Contrast  Result Date: 05/06/2017 CLINICAL DATA:  Tachypneic.  Left lower quadrant abdominal pain. EXAM: CT ANGIOGRAPHY CHEST CT ABDOMEN AND PELVIS WITH CONTRAST TECHNIQUE: Multidetector CT imaging of the chest was performed using the standard protocol during bolus administration of intravenous contrast. Multiplanar CT image reconstructions and MIPs were obtained to evaluate the vascular anatomy. Multidetector CT imaging of the abdomen and pelvis was performed using the standard protocol during bolus administration of intravenous contrast. CONTRAST:  1mL ISOVUE-300 IOPAMIDOL (ISOVUE-300) INJECTION 61% COMPARISON:  CT scan of May 02, 2016 and April 06, 2014. FINDINGS: CTA CHEST FINDINGS Cardiovascular: Satisfactory opacification of the pulmonary arteries to the segmental level. No evidence of pulmonary embolism. Normal heart size. No pericardial effusion. Atherosclerosis of thoracic aorta is noted without aneurysm. Coronary artery calcifications are noted. Mediastinum/Nodes: No enlarged mediastinal, hilar, or axillary lymph nodes. Thyroid gland, trachea, and esophagus demonstrate no significant findings. Lungs/Pleura: No pneumothorax is noted. Minimal bilateral  pleural effusions are noted. Bilateral interstitial densities are noted throughout both lungs most consistent with pulmonary edema. Musculoskeletal: No chest wall abnormality. No acute or significant osseous findings. Review of the MIP images confirms the above findings. CT ABDOMEN and PELVIS FINDINGS Hepatobiliary: Dilated gallbladder is noted but no cholelithiasis or inflammatory changes are noted. 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 Pancreas: Unremarkable. No pancreatic ductal dilatation or surrounding inflammatory changes. Spleen: Normal in size without focal abnormality. Adrenals/Urinary Tract: Adrenal glands are unremarkable. Kidneys are normal, without renal calculi, focal lesion, or hydronephrosis. Bladder is unremarkable. Stomach/Bowel: Stomach is within normal limits. Appendix appears normal. No evidence of bowel wall thickening, distention, or inflammatory changes. Sigmoid diverticulosis is noted. Vascular/Lymphatic: Aortic atherosclerosis. No enlarged abdominal or pelvic lymph  nodes. 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 Reproductive: Status post prostatectomy. Other: Moderate size fat containing right inguinal hernia is noted. Musculoskeletal: Stable left acetabular enostosis. No other significant abnormality seen. Review of the MIP images confirms the above findings. IMPRESSION: No definite evidence pulmonary embolus. Aortic atherosclerosis. Coronary calcifications are noted suggesting coronary artery disease. Bilateral pulmonary edema is noted with minimal pleural effusions. Moderate size fat containing right inguinal hernia. 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 Dilated gallbladder is noted without evidence of inflammation. No cholelithiasis is noted. Electronically Signed   By: Marijo Conception, M.D.   On: 05/06/2017 21:10   Dg Chest Port 1 View  Result Date: 05/07/2017 CLINICAL DATA:   Acute onset of shortness of breath. Initial encounter. EXAM: PORTABLE CHEST 1 VIEW COMPARISON:  Chest radiograph and CTA of the chest performed 05/06/2017 FINDINGS: Vascular congestion is noted, with bilateral central airspace opacification compatible with pulmonary edema. No definite pleural effusion or pneumothorax is seen. The cardiomediastinal silhouette is borderline enlarged. No acute osseous abnormalities identified. IMPRESSION: Vascular congestion and borderline cardiomegaly. New bilateral central airspace opacification is compatible with pulmonary edema. Electronically Signed   By: Garald Balding M.D.   On: 05/07/2017 06:14   Dg Chest Port 1 View  Result Date: 05/06/2017 CLINICAL DATA:  Dyspnea, vomiting and diarrhea x2 days. EXAM: PORTABLE CHEST 1 VIEW COMPARISON:  03/23/2013 FINDINGS: Borderline cardiomegaly. No aortic aneurysm. No pneumonic consolidation or CHF. No effusion or pneumothorax. Mild hyperinflation of the lungs, upper lobe predominant and left greater than right. IMPRESSION: Mild hyperinflation of the lungs, more so in the left upper lobe. Electronically Signed   By: Ashley Royalty M.D.   On: 05/06/2017 19:25     Medications:   . ciprofloxacin Stopped (05/07/17 0818)  . heparin 1,100 Units/hr (05/06/17 2148)   . allopurinol  200 mg Oral BID  . amLODipine  5 mg Oral BID  . aspirin  81 mg Oral Daily  . citalopram  10 mg Oral Daily  . cloNIDine  0.1 mg Oral QHS  . clopidogrel  75 mg Oral Daily  . colchicine  0.6 mg Oral TID  . [START ON 05/08/2017] furosemide  20 mg Intravenous BID  . furosemide  40 mg Intravenous Once  . insulin aspart  0-15 Units Subcutaneous Q4H  . insulin glargine  25 Units Subcutaneous QHS  . ipratropium-albuterol  3 mL Nebulization Q4H  . isosorbide mononitrate  60 mg Oral Daily  . lisinopril  10 mg Oral Daily  . loratadine  10 mg Oral Daily  . metoprolol tartrate  25 mg Oral BID  . mometasone-formoterol  2 puff Inhalation BID  . pantoprazole  40  mg Oral BID  . rosuvastatin  40 mg Oral Daily  . sodium bicarbonate      . sucralfate  1 g Oral TID WC & HS  . Vitamin D (Ergocalciferol)  50,000 Units Oral Q30 days   acetaminophen, guaiFENesin **AND** dextromethorphan, morphine injection, nitroGLYCERIN, ondansetron (ZOFRAN) IV  Assessment/ Plan:  76 y.o. 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 Acute resp failure - Pulm edema by CT but minimal crackles on Exam - probably related to NTEMI - will obtain ANCA panel and Anti GBM Ab  3. DM-2 with CKD - Baseline Cr 1.7/GFR 38 as outpatient - HbA1c 8.2 % (05/02/17)  4. Acute gastroenteritis - Salmonella detected - C diff negative  5. Acute NSTEMI - Mgmt as per ICU team/Cardiology    LOS: [place] 5/29/20189:26 [place], [place]

## 2017-05-07 NOTE — Consult Note (Addendum)
[place]  CARDIOLOGY CONSULT NOTE  Patient ID: Mike Decker MRN: [id number] DOB/AGE: 1941/08/15 76 y.o.  Admit date: 05/06/2017 Referring Physician Dr. Anselm Jungling  Primary Physician Dr. Lavera Guise Primary Cardiologist Dr. Clayborn Bigness Reason for Consultation elevated troponin  HPI: Patient is a 76 year old male with history of coronary artery disease status post PCI, peripheral vascular disease status post lower extremity stent, history of Barrett's esophagitis status post ablation of this, history of tachycardia with occasional and lightheadedness. Patient developed severe abdominal pain with nausea and vomiting for 2 days. He had no chest pain other than pain or vomiting. Admitted and developed hypoxia while on the floor and was transferred to the ICU with worsening shortness of breath requiring BiPAP. In brief episode of atrial fibrillation with rapid ventricular response. He has had left lower quadrant abdominal pain lasting approximately 3 days associated liquid diarrhea nausea. He has had diverticulitis flareups in the past similar to this and was placed on Cipro and Flagyl however his symptoms worsen. He developed some chest pain while in route to the ER. In the emergency room he had some borderline ST depression on electrocardiogram. His initial serum troponin was 0.34 with a subsequent value 5. He was given aspirin and heparin. Subsequent serum troponins of increased to 21.5. His renal function is also worsened to 2.32 from a baseline of 1.6. His breathing has improved and he is gradually being weaned off of BiPAP. Stool culture revealed Salmonella species. Blood cultures are negative thusfar  Review of Systems  Constitutional: Positive for malaise/fatigue and weight loss.  HENT: Negative.   Eyes: Negative.   Respiratory: Positive for shortness of breath.   Cardiovascular: Positive for chest pain.  Gastrointestinal: Positive for abdominal  pain, diarrhea, nausea and vomiting.  Genitourinary: Negative.   Musculoskeletal: Negative.   Skin: Negative.   Neurological: Positive for weakness.  Endo/Heme/Allergies: Negative.   Psychiatric/Behavioral: Negative.     Past Medical History:  Diagnosis Date  . Arthritis   . Atrioventricular canal (AVC)    irregular heart beats  . Barrett esophagus   . Cancer [facility]) 2002   prostate  . Colon polyp   . Diabetes mellitus without complication ([place])   . Diverticulosis   . Gout   . Heart disease   . Hemangioma    liver  . Hyperlipidemia   . Hypertension   . Myocardial infarct ([place])   . Ocular hypertension   . Peripheral vascular disease ([place])   . Skin cancer   . Sleep apnea   . Vitreoretinal degeneration     Family History  Problem Relation Age of Onset  . Stroke Maternal Grandfather     Social History   Social History  . Marital status: Married    Spouse name: N/A  . Number of children: N/A  . Years of education: N/A   Occupational History  . Not on file.   Social History Main Topics  . Smoking status: Former Smoker    Years: 20.00    Types: Cigarettes    Quit date: 12/10/1976  . Smokeless tobacco: Never Used  . Alcohol use No  . Drug use: No  . Sexual activity: Not on file   Other Topics Concern  . Not on file   Social History Narrative  . No narrative on file    Past Surgical History:  Procedure Laterality Date  . CATARACT EXTRACTION  2011, 2012  . CORONARY ANGIOPLASTY WITH STENT PLACEMENT  2012  .  ESOPHAGOGASTRODUODENOSCOPY (EGD) WITH PROPOFOL N/A 06/01/2016   Procedure: ESOPHAGOGASTRODUODENOSCOPY (EGD) WITH PROPOFOL;  Surgeon: Manya Silvas, MD;  Location: [facility] ENDOSCOPY;  Service: Endoscopy;  Laterality: N/A;  . HERNIA REPAIR     x2  . INTRAOCULAR LENS INSERTION    . NOSE SURGERY     submucous resection  . PROSTATE SURGERY  2002  . ROTATOR CUFF REPAIR Right      Prescriptions Prior to Admission  Medication Sig Dispense Refill Last Dose    . acetaminophen (TYLENOL) 500 MG tablet Take 1,000 mg by mouth every 6 (six) hours as needed for mild pain, moderate pain or headache.   prn at prn  . allopurinol (ZYLOPRIM) 100 MG tablet Take 200 mg by mouth 2 (two) times daily.    05/06/2017 at am  . amLODipine (NORVASC) 5 MG tablet TAKE 1 TABLET (5 MG TOTAL) BY MOUTH 2 (TWO) TIMES DAILY.  5 05/06/2017 at am  . aspirin (ASPIRIN CHILDRENS) 81 MG chewable tablet Chew 1 tablet (81 mg total) by mouth daily. 120 tablet 0 05/06/2017 at Unknown time  . budesonide-formoterol (SYMBICORT) 160-4.5 MCG/ACT inhaler Inhale 2 puffs into the lungs 2 (two) times daily.   05/06/2017 at am  . cetirizine (ZYRTEC) 5 MG tablet Take 5 mg by mouth daily as needed for allergies or rhinitis.    prn at prn  . Cholecalciferol (VITAMIN D3) 50000 UNITS CAPS Take 50,000 Units by mouth every 30 (thirty) days.   04/30/2017  . ciprofloxacin (CIPRO) 500 MG tablet Take 500 mg by mouth 2 (two) times daily.   05/06/2017 at 1530  . citalopram (CELEXA) 10 MG tablet Take 10 mg by mouth daily.   05/06/2017 at am  . cloNIDine (CATAPRES) 0.1 MG tablet Take 1 tablet (0.1 mg total) by mouth 2 (two) times daily. (Patient taking differently: Take 0.1 mg by mouth at bedtime. ) 60 tablet 11 05/05/2017 at qhs   . clopidogrel (PLAVIX) 75 MG tablet Take 1 tablet (75 mg total) by mouth daily. 30 tablet 0 05/06/2017 at am  . colchicine 0.6 MG tablet Take 0.6 mg by mouth 3 (three) times daily.   prn at prn  . dextromethorphan-guaiFENesin (MUCINEX DM) 30-600 MG 12hr tablet Take 1 tablet by mouth 2 (two) times daily as needed for cough.   prn at prn  . EPINEPHrine (EPI-PEN) 0.3 mg/0.3 mL SOAJ injection Inject into the muscle as needed (anaphylaxis).    prn at prn  . furosemide (LASIX) 20 MG tablet Take 20 mg by mouth 2 (two) times daily.    05/06/2017 at am  . insulin aspart (NOVOLOG FLEXPEN) 100 UNIT/ML FlexPen Inject 14-20 Units into the skin See admin instructions. Inject 14 units every morning with breakfast  and follow sliding scale for the rest of meals.   05/06/2017 at am  . insulin glargine (LANTUS) 100 UNIT/ML injection Inject 55 Units into the skin at bedtime.    05/05/2017 at qhs  . isosorbide mononitrate (IMDUR) 120 MG 24 hr tablet Take 1 tablet (120 mg total) by mouth daily. (Patient taking differently: Take 60 mg by mouth daily. ) 30 tablet 0 05/06/2017 at am  . lisinopril (PRINIVIL,ZESTRIL) 10 MG tablet Take 10 mg by mouth daily.   05/06/2017 at am  . metoprolol (LOPRESSOR) 50 MG tablet Take 25 mg by mouth 2 (two) times daily.    05/06/2017 at am  . metroNIDAZOLE (FLAGYL) 250 MG tablet Take 250 mg by mouth 3 (three) times daily.   05/06/2017 at 1200  .  nitroGLYCERIN (NITROSTAT) 0.4 MG SL tablet Place 0.4 mg under the tongue every 5 (five) minutes x 3 doses as needed for chest pain.    prn at prn  . pantoprazole (PROTONIX) 40 MG tablet Take 40 mg by mouth 2 (two) times daily.   05/06/2017 at am  . polyethylene glycol powder (GLYCOLAX/MIRALAX) powder Use as directed   prn at prn  . rosuvastatin (CRESTOR) 40 MG tablet Take 40 mg by mouth daily.   05/05/2017 at pm  . CARAFATE 1 GM/10ML suspension TAKE 2 TEASPOONSFUL (10MLS) BY MOUTH 4 TIMES A DAY BEFORE MEALS AND NIGHTLY FOR 14 DAYS  2 Not Taking at Unknown time    Physical Exam: Blood pressure (!) 123/55, pulse 68, temperature 99 F (37.2 C), resp. rate (!) 25, height 5\' 7"  (1.702 m), weight 86.2 kg (190 lb 0.6 oz), SpO2 94 %.   Wt Readings from Last 1 Encounters:  05/07/17 86.2 kg (190 lb 0.6 oz)     General appearance: alert and cooperative Head: Normocephalic, without obvious abnormality, atraumatic Resp: rhonchi bibasilar Chest wall: no tenderness Cardio: regular rate and rhythm GI: abnormal findings:  mild tenderness in the LLQ Extremities: extremities normal, atraumatic, no cyanosis or edema Pulses: 2+ and symmetric Neurologic: Grossly normal  Labs:   Lab Results  Component Value Date   WBC 15.1 (H) 05/07/2017   HGB 11.6 (L)  05/07/2017   HCT 35.0 (L) 05/07/2017   MCV 88.0 05/07/2017   PLT 208 05/07/2017    Recent Labs Lab 05/06/17 1901 05/07/17 0637  NA 135 136  K 3.5 4.3  CL 101 103  CO2 22 20*  BUN 37* 44*  CREATININE 1.99* 2.32*  CALCIUM 8.4* 7.9*  PROT 7.1  --   BILITOT 0.7  --   ALKPHOS 69  --   ALT 13*  --   AST 24  --   GLUCOSE 120* 238*   Lab Results  Component Value Date   TROPONINI 21.51 ([place]) 05/07/2017      Radiology: Chest x-ray showed vascular congestion borderline cardiomegaly. There was some new bilateral airspace opacification consistent with.  EKG: EKG revealed sinus rhythm with nonspecific ST-T wave changes.  ASSESSMENT AND PLAN:  Patient is a 76 year old male with history of esophageal cancer status post ablation who was admitted after developing nausea vomiting and diarrhea. He presents emergency room after a brief episode of chest pain. EKG showed nonspecific ST-T wave changes however his serum troponin increased to 27. He is currently pain-free and hemodynamically stable. Will need to evaluate further his coronary anatomy. He also is being treated for Salmonella enteritis. His renal function is also worsened somewhat. We'll continue to follow and as his renal function improves and Salmonella antritis improves, we'll need consideration for left cardiac catheterization. We'll continue with heparin for now. Would continue with metoprolol, long-acting nitrates. Further recommendations after renal function improves and we can proceed with left cardiac catheter. Signed: Teodoro Spray MD, [facility] 05/07/2017, 1:39 PM

## 2017-05-07 NOTE — Progress Notes (Signed)
Inpatient Diabetes Program Recommendations  AACE/ADA: New Consensus Statement on Inpatient Glycemic Control (2015)  Target Ranges:  Prepandial:   less than 140 mg/dL      Peak postprandial:   less than 180 mg/dL (1-2 hours)      Critically ill patients:  140 - 180 mg/dL   Lab Results  Component Value Date   GLUCAP 181 (H) 05/07/2017   HGBA1C 9.4 (H) 10/08/2015    Review of Glycemic Control  Results for Mike Decker, Mike Decker (MRN [id number]) as of 05/07/2017 13:48  Ref. Range 05/07/2017 01:07 05/07/2017 04:39 05/07/2017 06:17 05/07/2017 07:42 05/07/2017 12:39  Glucose-Capillary Latest Ref Range: 65 - 99 mg/dL 169 (H) 178 (H) 218 (H) 201 (H) 181 (H)   Diabetes history: Type 2, A1C pending Outpatient Diabetes medications: Lantus 55 units qday, Novolog 14 units with breakfast, sliding scale for lunch and supper.   Current orders for Inpatient glycemic control: Lantus 10 units qday, Novolog 0-15 units q4h  Inpatient Diabetes Program Recommendations:   Agree with current medications for blood sugar management.   Gentry Fitz, RN, BA, MHA, CDE Diabetes Coordinator Inpatient Diabetes Program  [phone] (Team Pager) [phone] ([place]) 05/07/2017 1:52 PM

## 2017-05-07 NOTE — Progress Notes (Signed)
Pt's heart rate has jumped up into the 140's and is bouncing around anywhere  From the low 100's -140's and is converted to afib. Pt is asymptomatic. MD paged, Dr. Ara Kussmaul just wants to monitor and give his amlodipine, metoprolol, and clonidine. Will give medications and continue to monitor. Conley Simmonds, RN, BSN

## 2017-05-07 NOTE — Progress Notes (Signed)
Dr Ubaldo Glassing aware of patient increasing troponin level. At this point no additional order given.

## 2017-05-07 NOTE — Progress Notes (Signed)
*  PRELIMINARY RESULTS* Echocardiogram 2D Echocardiogram has been performed.  Mike Decker 05/07/2017, 3:02 PM

## 2017-05-07 NOTE — Progress Notes (Signed)
ANTICOAGULATION CONSULT NOTE - Initial Consult  Pharmacy Consult for Heparin  Indication: chest pain/ACS  Allergies  Allergen Reactions  . Peanut-Containing Drug Products Anaphylaxis  . Penicillins Hives and Rash    Has patient had a PCN reaction causing immediate rash, facial/tongue/throat swelling, SOB or lightheadedness with hypotension: Yes Has patient had a PCN reaction causing severe rash involving mucus membranes or skin necrosis: No Has patient had a PCN reaction that required hospitalization No Has patient had a PCN reaction occurring within the last 10 years: No If all of the above answers are "NO", then may proceed with Cephalosporin use.  . Bee Venom Swelling  . Influenza Vaccines Hives  . Inh [Isoniazid] Hives  . Kenalog [Triamcinolone Acetonide] Hives  . Nalfon [Fenoprofen Calcium] Hives    Patient Measurements: Height: 5\' 7"  (170.2 cm) Weight: 190 lb 0.6 oz (86.2 kg) IBW/kg (Calculated) : 66.1 Heparin Dosing Weight: 84.1 kg   Vital Signs: Temp: 99 F (37.2 C) (05/29 0700) Temp Source: Oral (05/29 0452) BP: 123/55 (05/29 1200) Pulse Rate: 68 (05/29 1200)  Labs:  Recent Labs  05/06/17 1901 05/06/17 2154 05/07/17 0356 05/07/17 0637 05/07/17 0950 05/07/17 1415  HGB 12.2*  --   --  11.6*  --   --   HCT 36.7*  --   --  35.0*  --   --   PLT 203  --   --  208  --   --   LABPROT  --   --   --  14.2  --   --   INR  --   --   --  1.10  --   --   HEPARINUNFRC  --   --   --  0.30  --  0.36  CREATININE 1.99*  --   --  2.32*  --   --   TROPONINI 0.34* 5.27* 15.97*  --  21.51*  --     Estimated Creatinine Clearance: 27.9 mL/min (A) (by C-G formula based on SCr of 2.32 mg/dL (H)).   Medical History: Past Medical History:  Diagnosis Date  . Arthritis   . Atrioventricular canal (AVC)    irregular heart beats  . Barrett esophagus   . Cancer [facility]) 2002   prostate  . Colon polyp   . Diabetes mellitus without complication ([place])   . Diverticulosis   . Gout    . Heart disease   . Hemangioma    liver  . Hyperlipidemia   . Hypertension   . Myocardial infarct ([place])   . Ocular hypertension   . Peripheral vascular disease ([place])   . Skin cancer   . Sleep apnea   . Vitreoretinal degeneration     Medications:  Scheduled:  . allopurinol  200 mg Oral BID  . amLODipine  5 mg Oral BID  . aspirin  81 mg Oral Daily  . citalopram  10 mg Oral Daily  . cloNIDine  0.1 mg Oral QHS  . clopidogrel  75 mg Oral Daily  . [START ON 05/08/2017] furosemide  20 mg Intravenous BID  . furosemide  40 mg Intravenous Once  . insulin aspart  0-15 Units Subcutaneous Q4H  . insulin glargine  10 Units Subcutaneous QHS  . ipratropium-albuterol  3 mL Nebulization Q4H  . isosorbide mononitrate  60 mg Oral Daily  . loratadine  10 mg Oral Daily  . metoprolol tartrate  25 mg Oral BID  . mometasone-formoterol  2 puff Inhalation BID  . pantoprazole (PROTONIX) IV  40 mg Intravenous Q12H  . rosuvastatin  40 mg Oral Daily  . sodium bicarbonate      . sucralfate  1 g Oral TID WC & HS  . Vitamin D (Ergocalciferol)  50,000 Units Oral Q30 days    Assessment: Pharmacy consulted to dose heparin in this 76 year old male admitted with AECOPD and NSTEMI.   Goal of Therapy:  Heparin level 0.3-0.7 units/ml Monitor platelets by anticoagulation protocol: Yes   Plan:  Will continue heparin drip at 1100 units/hr and recheck HL and CBC in AM.   Ulice Dash D 05/07/2017,3:28 PM

## 2017-05-08 ENCOUNTER — Inpatient Hospital Stay: Payer: Medicare Other

## 2017-05-08 DIAGNOSIS — I481 Persistent atrial fibrillation: Secondary | ICD-10-CM

## 2017-05-08 LAB — BASIC METABOLIC PANEL
Anion gap: 11 (ref 5–15)
BUN: 49 mg/dL — AB (ref 6–20)
CALCIUM: 8 mg/dL — AB (ref 8.9–10.3)
CO2: 25 mmol/L (ref 22–32)
CREATININE: 2.33 mg/dL — AB (ref 0.61–1.24)
Chloride: 105 mmol/L (ref 101–111)
GFR, EST AFRICAN AMERICAN: 29 mL/min — AB (ref >60)
GFR, EST NON AFRICAN AMERICAN: 25 mL/min — AB (ref >60)
Glucose, Bld: 184 mg/dL — ABNORMAL HIGH (ref 65–99)
Potassium: 3.5 mmol/L (ref 3.5–5.1)
SODIUM: 141 mmol/L (ref 135–145)

## 2017-05-08 LAB — CBC
HCT: 30.8 % — ABNORMAL LOW (ref 40.0–52.0)
Hemoglobin: 10.4 g/dL — ABNORMAL LOW (ref 13.0–18.0)
MCH: 29.8 pg (ref 26.0–34.0)
MCHC: 33.9 g/dL (ref 32.0–36.0)
MCV: 87.7 fL (ref 80.0–100.0)
PLATELETS: 184 10*3/uL (ref 150–440)
RBC: 3.51 MIL/uL — ABNORMAL LOW (ref 4.40–5.90)
RDW: 14.9 % — ABNORMAL HIGH (ref 11.5–14.5)
WBC: 9.3 10*3/uL (ref 3.8–10.6)

## 2017-05-08 LAB — URINE CULTURE: Culture: NO GROWTH

## 2017-05-08 LAB — GLUCOSE, CAPILLARY
GLUCOSE-CAPILLARY: 162 mg/dL — AB (ref 65–99)
GLUCOSE-CAPILLARY: 164 mg/dL — AB (ref 65–99)
GLUCOSE-CAPILLARY: 198 mg/dL — AB (ref 65–99)
GLUCOSE-CAPILLARY: 213 mg/dL — AB (ref 65–99)
GLUCOSE-CAPILLARY: 222 mg/dL — AB (ref 65–99)
GLUCOSE-CAPILLARY: 229 mg/dL — AB (ref 65–99)
Glucose-Capillary: 191 mg/dL — ABNORMAL HIGH (ref 65–99)

## 2017-05-08 LAB — ANCA TITERS: Atypical P-ANCA titer: <1:20 {titer}

## 2017-05-08 LAB — MPO/PR-3 (ANCA) ANTIBODIES

## 2017-05-08 LAB — GLOMERULAR BASEMENT MEMBRANE ANTIBODIES: GBM AB: 6 U (ref 0–20)

## 2017-05-08 LAB — MAGNESIUM: MAGNESIUM: 1.9 mg/dL (ref 1.7–2.4)

## 2017-05-08 LAB — HEMOGLOBIN A1C
Hgb A1c MFr Bld: 7.7 % — ABNORMAL HIGH (ref 4.8–5.6)
Mean Plasma Glucose: 174 mg/dL

## 2017-05-08 LAB — HEPARIN LEVEL (UNFRACTIONATED): HEPARIN UNFRACTIONATED: 0.43 [IU]/mL (ref 0.30–0.70)

## 2017-05-08 MED ORDER — HYDROCOD POLST-CPM POLST ER 10-8 MG/5ML PO SUER
5.0000 mL | Freq: Two times a day (BID) | ORAL | Status: DC | PRN
  Administered 2017-05-08: 5 mL via ORAL
  Filled 2017-05-08: qty 5

## 2017-05-08 MED ORDER — METOPROLOL TARTRATE 5 MG/5ML IV SOLN
INTRAVENOUS | Status: AC
  Filled 2017-05-08: qty 10

## 2017-05-08 MED ORDER — HYDROCOD POLST-CPM POLST ER 10-8 MG/5ML PO SUER
5.0000 mL | Freq: Three times a day (TID) | ORAL | Status: DC | PRN
  Administered 2017-05-08 – 2017-05-14 (×8): 5 mL via ORAL
  Filled 2017-05-08 (×8): qty 5

## 2017-05-08 MED ORDER — NITROGLYCERIN IN D5W 200-5 MCG/ML-% IV SOLN
2.0000 ug/min | INTRAVENOUS | Status: DC
  Administered 2017-05-08: 5 ug/min via INTRAVENOUS
  Filled 2017-05-08: qty 250

## 2017-05-08 MED ORDER — METOPROLOL TARTRATE 5 MG/5ML IV SOLN
10.0000 mg | Freq: Once | INTRAVENOUS | Status: AC
  Administered 2017-05-08: 10 mg via INTRAVENOUS

## 2017-05-08 MED ORDER — METOPROLOL TARTRATE 50 MG PO TABS
50.0000 mg | ORAL_TABLET | Freq: Four times a day (QID) | ORAL | Status: DC
  Administered 2017-05-08 – 2017-05-14 (×24): 50 mg via ORAL
  Filled 2017-05-08 (×25): qty 1

## 2017-05-08 MED ORDER — ASPIRIN 325 MG PO TABS
325.0000 mg | ORAL_TABLET | ORAL | Status: AC
  Administered 2017-05-08: 325 mg via ORAL
  Filled 2017-05-08: qty 1

## 2017-05-08 MED ORDER — SODIUM CHLORIDE 0.9 % IV SOLN
INTRAVENOUS | Status: AC
  Administered 2017-05-08 – 2017-05-09 (×3): via INTRAVENOUS

## 2017-05-08 NOTE — Progress Notes (Signed)
[place], [place] 05/08/17  Subjective:   Patient known to our practice from outpatient. He is followed by Dr Juleen China. Patient presented to ER with c/o diarrhea, vomiting for 2 days. He was prescribed Flagyl and Cipro as outpatient. He was noted to be hypoxic in ER and was placed on supplemental Oxygen. He underwent CT of chest, abdomen and pelvis with IV Contrast. No PE was noted. 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  Objective:  Vital signs in last 24 hours:  Temp:  [97.5 F (36.4 C)-99.1 F (37.3 C)] 97.5 F (36.4 C) (05/30 1200) Pulse Rate:  [63-88] 80 (05/30 1331) Resp:  [12-29] 16 (05/30 1300) BP: (108-148)/(47-62) 128/57 (05/30 1331) SpO2:  [92 %-99 %] 93 % (05/30 1300) FiO2 (%):  [35 %-90 %] 50 % (05/30 1220)  Weight change:  Filed Weights   05/06/17 1857 05/07/17 0046 05/07/17 0615  Weight: 87.5 kg (193 lb) 86.6 kg (190 lb 14.4 oz) 86.2 kg (190 lb 0.6 oz)    Intake/Output:    Intake/Output Summary (Last 24 hours) at 05/08/17 1535 Last data filed at 05/08/17 1502  Gross per 24 hour  Intake               50 ml  Output             2375 ml  Net            -2325 ml     Physical Exam: General: NAD, laying in bed  HEENT High flow nasal cannula   Neck supple  Pulm/lungs Minimal basilar crackles   CVS/Heart Regular, no rub  Abdomen:  Soft, Non tender  Extremities: No peripheral edema  Neurologic: Alert, oriented  Skin: No rashes          Basic Metabolic Panel:   Recent Labs Lab 05/06/17 1901 05/07/17 0356 05/07/17 0637 05/08/17 0422  NA 135  --  136 141  K 3.5  --  4.3 3.5  CL 101  --  103 105  CO2  22  --  20* 25  GLUCOSE 120*  --  238* 184*  BUN 37*  --  44* 49*  CREATININE 1.99*  --  2.32* 2.33*  CALCIUM 8.4*  --  7.9* 8.0*  MG  --  1.7  --  1.9     CBC:  Recent Labs Lab 05/06/17 1901 05/07/17 0637 05/08/17 0422  WBC 11.7* 15.1* 9.3  NEUTROABS 10.4*  --   --   HGB 12.2* 11.6* 10.4*  HCT 36.7* 35.0* 30.8*  MCV 89.9 88.0 87.7  PLT 203 208 184     No results found for: HEPBSAG, HEPBSAB, HEPBIGM    Microbiology:  Recent Results (from the past 240 hour(s))  Blood Culture (routine x 2)     Status: None (Preliminary result)   Collection Time: 05/06/17  7:02 PM  Result Value Ref Range Status   Specimen Description BLOOD RIGHT HAND  Final   Special Requests   Final    BOTTLES DRAWN AEROBIC AND ANAEROBIC Blood Culture adequate volume   Culture NO GROWTH 2 DAYS  Final   Report Status PENDING  Incomplete  Blood Culture (routine x 2)     Status:  None (Preliminary result)   Collection Time: 05/06/17  7:02 PM  Result Value Ref Range Status   Specimen Description BLOOD LEFT AC  Final   Special Requests   Final    BOTTLES DRAWN AEROBIC AND ANAEROBIC Blood Culture adequate volume   Culture NO GROWTH 2 DAYS  Final   Report Status PENDING  Incomplete  Urine culture     Status: None   Collection Time: 05/06/17  7:18 PM  Result Value Ref Range Status   Specimen Description URINE, RANDOM  Final   Special Requests NONE  Final   Culture   Final    NO GROWTH Performed at [facility], [place] [address] [place], [place] [address]    Report Status 05/08/2017 FINAL  Final  Gastrointestinal Panel by PCR , Stool     Status: Abnormal   Collection Time: 05/07/17  4:44 AM  Result Value Ref Range Status   Campylobacter species NOT DETECTED NOT DETECTED Final   Plesimonas shigelloides NOT DETECTED NOT DETECTED Final   Salmonella species DETECTED (A) NOT DETECTED Final    Comment: RESULT CALLED TO, READ BACK BY AND VERIFIED WITH: BETH BUONO 05/07/17 0642 ALV    Yersinia  enterocolitica NOT DETECTED NOT DETECTED Final   Vibrio species NOT DETECTED NOT DETECTED Final   Vibrio cholerae NOT DETECTED NOT DETECTED Final   Enteroaggregative E coli (EAEC) NOT DETECTED NOT DETECTED Final   Enteropathogenic E coli (EPEC) NOT DETECTED NOT DETECTED Final   Enterotoxigenic E coli (ETEC) NOT DETECTED NOT DETECTED Final   Shiga like toxin producing E coli (STEC) NOT DETECTED NOT DETECTED Final   E. coli O157 NOT DETECTED NOT DETECTED Final   Shigella/Enteroinvasive E coli (EIEC) NOT DETECTED NOT DETECTED Final   Cryptosporidium NOT DETECTED NOT DETECTED Final   Cyclospora cayetanensis NOT DETECTED NOT DETECTED Final   Entamoeba histolytica NOT DETECTED NOT DETECTED Final   Giardia lamblia NOT DETECTED NOT DETECTED Final   Adenovirus F40/41 NOT DETECTED NOT DETECTED Final   Astrovirus NOT DETECTED NOT DETECTED Final   Norovirus GI/GII NOT DETECTED NOT DETECTED Final   Rotavirus A NOT DETECTED NOT DETECTED Final   Sapovirus (I, II, IV, and V) NOT DETECTED NOT DETECTED Final  C difficile quick scan w PCR reflex     Status: None   Collection Time: 05/07/17  4:44 AM  Result Value Ref Range Status   C Diff antigen NEGATIVE NEGATIVE Final   C Diff toxin NEGATIVE NEGATIVE Final   C Diff interpretation No C. difficile detected.  Final  MRSA PCR Screening     Status: None   Collection Time: 05/07/17  6:24 AM  Result Value Ref Range Status   MRSA by PCR NEGATIVE NEGATIVE Final    Comment:        The GeneXpert MRSA Assay (FDA approved for NASAL specimens only), is one component of a comprehensive MRSA colonization surveillance program. It is not intended to diagnose MRSA infection nor to guide or monitor treatment for MRSA infections.     Coagulation Studies:  Recent Labs  05/07/17 0637  LABPROT 14.2  INR 1.10    Urinalysis:  Recent Labs  05/06/17 1918  COLORURINE YELLOW*  LABSPEC 1.012  PHURINE 5.0  GLUCOSEU NEGATIVE  HGBUR MODERATE*  BILIRUBINUR  NEGATIVE  KETONESUR 5*  PROTEINUR 100*  NITRITE NEGATIVE  LEUKOCYTESUR NEGATIVE      Imaging: Dg Chest 1 View  Result Date: 05/08/2017 CLINICAL DATA:  Dyspnea EXAM: CHEST 1 VIEW  COMPARISON:  Chest x-ray of May 07, 2017 FINDINGS: The lungs are adequately inflated. The pulmonary interstitial markings have improved significantly. The pulmonary vascularity is less engorged. The cardiac silhouette remains mildly enlarged. There is no pleural effusion or alveolar infiltrate. IMPRESSION: Improving CHF with decreased interstitial edema. Electronically Signed   By: David  Martinique M.D.   On: 05/08/2017 07:10   Ct Angio Chest Pe W Or Wo Contrast  Result Date: 05/06/2017 CLINICAL DATA:  Tachypneic.  Left lower quadrant abdominal pain. EXAM: CT ANGIOGRAPHY CHEST CT ABDOMEN AND PELVIS WITH CONTRAST TECHNIQUE: Multidetector CT imaging of the chest was performed using the standard protocol during bolus administration of intravenous contrast. Multiplanar CT image reconstructions and MIPs were obtained to evaluate the vascular anatomy. Multidetector CT imaging of the abdomen and pelvis was performed using the standard protocol during bolus administration of intravenous contrast. CONTRAST:  53mL ISOVUE-300 IOPAMIDOL (ISOVUE-300) INJECTION 61% COMPARISON:  CT scan of May 02, 2016 and April 06, 2014. FINDINGS: CTA CHEST FINDINGS Cardiovascular: Satisfactory opacification of the pulmonary arteries to the segmental level. No evidence of pulmonary embolism. Normal heart size. No pericardial effusion. Atherosclerosis of thoracic aorta is noted without aneurysm. Coronary artery calcifications are noted. Mediastinum/Nodes: No enlarged mediastinal, hilar, or axillary lymph nodes. Thyroid gland, trachea, and esophagus demonstrate no significant findings. Lungs/Pleura: No pneumothorax is noted. Minimal bilateral pleural effusions are noted. Bilateral interstitial densities are noted throughout both lungs most consistent with  pulmonary edema. Musculoskeletal: No chest wall abnormality. No acute or significant osseous findings. Review of the MIP images confirms the above findings. CT ABDOMEN and PELVIS FINDINGS Hepatobiliary: Dilated gallbladder is noted but no cholelithiasis or inflammatory changes are noted. 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 Pancreas: Unremarkable. No pancreatic ductal dilatation or surrounding inflammatory changes. Spleen: Normal in size without focal abnormality. Adrenals/Urinary Tract: Adrenal glands are unremarkable. Kidneys are normal, without renal calculi, focal lesion, or hydronephrosis. Bladder is unremarkable. Stomach/Bowel: Stomach is within normal limits. Appendix appears normal. No evidence of bowel wall thickening, distention, or inflammatory changes. Sigmoid diverticulosis is noted. Vascular/Lymphatic: Aortic atherosclerosis. No enlarged abdominal or pelvic lymph nodes. 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 Reproductive: Status post prostatectomy. Other: Moderate size fat containing right inguinal hernia is noted. Musculoskeletal: Stable left acetabular enostosis. No other significant abnormality seen. Review of the MIP images confirms the above findings. IMPRESSION: No definite evidence pulmonary embolus. Aortic atherosclerosis. Coronary calcifications are noted suggesting coronary artery disease. Bilateral pulmonary edema is noted with minimal pleural effusions. Moderate size fat containing right inguinal hernia. 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 Dilated gallbladder is noted without evidence of inflammation. No cholelithiasis is noted. Electronically Signed   By: Marijo Conception, M.D.   On: 05/06/2017 21:10   Ct Abdomen Pelvis W Contrast  Result Date: 05/06/2017 CLINICAL DATA:  Tachypneic.  Left lower quadrant abdominal pain. EXAM: CT ANGIOGRAPHY CHEST CT ABDOMEN AND PELVIS WITH  CONTRAST TECHNIQUE: Multidetector CT imaging of the chest was performed using the standard protocol during bolus administration of intravenous contrast. Multiplanar CT image reconstructions and MIPs were obtained to evaluate the vascular anatomy. Multidetector CT imaging of the abdomen and pelvis was performed using the standard protocol during bolus administration of intravenous contrast. CONTRAST:  20mL ISOVUE-300 IOPAMIDOL (ISOVUE-300) INJECTION 61% COMPARISON:  CT scan of May 02, 2016 and April 06, 2014. FINDINGS: CTA CHEST FINDINGS Cardiovascular: Satisfactory opacification of the pulmonary arteries to the segmental level.  No evidence of pulmonary embolism. Normal heart size. No pericardial effusion. Atherosclerosis of thoracic aorta is noted without aneurysm. Coronary artery calcifications are noted. Mediastinum/Nodes: No enlarged mediastinal, hilar, or axillary lymph nodes. Thyroid gland, trachea, and esophagus demonstrate no significant findings. Lungs/Pleura: No pneumothorax is noted. Minimal bilateral pleural effusions are noted. Bilateral interstitial densities are noted throughout both lungs most consistent with pulmonary edema. Musculoskeletal: No chest wall abnormality. No acute or significant osseous findings. Review of the MIP images confirms the above findings. CT ABDOMEN and PELVIS FINDINGS Hepatobiliary: Dilated gallbladder is noted but no cholelithiasis or inflammatory changes are noted. 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 Pancreas: Unremarkable. No pancreatic ductal dilatation or surrounding inflammatory changes. Spleen: Normal in size without focal abnormality. Adrenals/Urinary Tract: Adrenal glands are unremarkable. Kidneys are normal, without renal calculi, focal lesion, or hydronephrosis. Bladder is unremarkable. Stomach/Bowel: Stomach is within normal limits. Appendix appears normal. No evidence of bowel wall thickening, distention, or inflammatory changes. Sigmoid  diverticulosis is noted. Vascular/Lymphatic: Aortic atherosclerosis. No enlarged abdominal or pelvic lymph nodes. 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 Reproductive: Status post prostatectomy. Other: Moderate size fat containing right inguinal hernia is noted. Musculoskeletal: Stable left acetabular enostosis. No other significant abnormality seen. Review of the MIP images confirms the above findings. IMPRESSION: No definite evidence pulmonary embolus. Aortic atherosclerosis. Coronary calcifications are noted suggesting coronary artery disease. Bilateral pulmonary edema is noted with minimal pleural effusions. Moderate size fat containing right inguinal hernia. 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 Dilated gallbladder is noted without evidence of inflammation. No cholelithiasis is noted. Electronically Signed   By: Marijo Conception, M.D.   On: 05/06/2017 21:10   Dg Chest Port 1 View  Result Date: 05/07/2017 CLINICAL DATA:  Acute onset of shortness of breath. Initial encounter. EXAM: PORTABLE CHEST 1 VIEW COMPARISON:  Chest radiograph and CTA of the chest performed 05/06/2017 FINDINGS: Vascular congestion is noted, with bilateral central airspace opacification compatible with pulmonary edema. No definite pleural effusion or pneumothorax is seen. The cardiomediastinal silhouette is borderline enlarged. No acute osseous abnormalities identified. IMPRESSION: Vascular congestion and borderline cardiomegaly. New bilateral central airspace opacification is compatible with pulmonary edema. Electronically Signed   By: Garald Balding M.D.   On: 05/07/2017 06:14   Dg Chest Port 1 View  Result Date: 05/06/2017 CLINICAL DATA:  Dyspnea, vomiting and diarrhea x2 days. EXAM: PORTABLE CHEST 1 VIEW COMPARISON:  03/23/2013 FINDINGS: Borderline cardiomegaly. No aortic aneurysm. No pneumonic consolidation or CHF. No effusion or pneumothorax.  Mild hyperinflation of the lungs, upper lobe predominant and left greater than right. IMPRESSION: Mild hyperinflation of the lungs, more so in the left upper lobe. Electronically Signed   By: Ashley Royalty M.D.   On: 05/06/2017 19:25     Medications:   . sodium chloride 50 mL/hr at 05/08/17 1023  . ciprofloxacin Stopped (05/08/17 1255)  . heparin 1,100 Units/hr (05/07/17 1705)  . nitroGLYCERIN 5 mcg/min (05/08/17 1022)   . allopurinol  200 mg Oral BID  . amLODipine  5 mg Oral BID  . citalopram  10 mg Oral Daily  . clopidogrel  75 mg Oral Daily  . insulin aspart  0-15 Units Subcutaneous Q4H  . insulin glargine  10 Units Subcutaneous QHS  . ipratropium-albuterol  3 mL Nebulization Q4H  . loratadine  10 mg Oral Daily  . metoprolol tartrate  50 mg Oral QID  . mometasone-formoterol  2 puff  Inhalation BID  . pantoprazole (PROTONIX) IV  40 mg Intravenous Q12H  . rosuvastatin  40 mg Oral Daily  . sucralfate  1 g Oral TID WC & HS  . Vitamin D (Ergocalciferol)  50,000 Units Oral Q30 days   acetaminophen, chlorpheniramine-HYDROcodone, guaiFENesin **AND** [DISCONTINUED] dextromethorphan, morphine injection, nitroGLYCERIN, ondansetron (ZOFRAN) IV  Assessment/ Plan:  76 y.o. 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 Stable today - Currently getting fuorsemide for Pulm edema - Use as Necessary - Will need iv fluids to prevent contrast nephropathy if cardiac cath is planned - Electrolytes and Volume status are acceptable No acute indication for Dialysis at present  - low-dose maintenance IV fluid only.  2. Acute resp failure - Pulm edema by CT but minimal crackles on Exam - probably related to NTEMI - will obtain ANCA panel and Anti GBM Ab  3. DM-2 with CKD - Baseline Cr 1.7/GFR 38 as outpatient - HbA1c 8.2 %  (05/02/17)  4. Acute gastroenteritis - Salmonella detected - C diff negative  5. Acute NSTEMI - Mgmt as per ICU team/Cardiology    LOS: [place] 5/30/20183:35 PM  [place], [place]

## 2017-05-08 NOTE — Progress Notes (Signed)
Patient began to c/o chest pain around 0840. Dr. Ashby Dawes notifed, received order for EKG and morphine given. Patient also went into afib rvr at this time. 10 mg of IV metoprolol given. Patient did convert back to NSR. Dr. Ubaldo Glassing came to assess patient, nitro gtt started. No other complaints at this time. Will continue to assess. Wilnette Kales

## 2017-05-08 NOTE — Progress Notes (Signed)
[facility]  SUBJECTIVE: Left arm and chest pain. Telemetry shows afib with increased ventricular response.    Vitals:   05/08/17 0500 05/08/17 0600 05/08/17 0700 05/08/17 0800  BP: (!) 125/52 (!) 118/51 (!) 127/51 (!) 137/59  Pulse: 75 71 72 71  Resp: 16 17 18 18   Temp:    98.2 F (36.8 C)  TempSrc:    Oral  SpO2: 97% 95% 97% 99%  Weight:      Height:        Intake/Output Summary (Last 24 hours) at 05/08/17 0925 Last data filed at 05/08/17 0906  Gross per 24 hour  Intake               50 ml  Output             2050 ml  Net            -2000 ml    LABS: Basic Metabolic Panel:  Recent Labs  05/07/17 0356 05/07/17 0637 05/08/17 0422  NA  --  136 141  K  --  4.3 3.5  CL  --  103 105  CO2  --  20* 25  GLUCOSE  --  238* 184*  BUN  --  44* 49*  CREATININE  --  2.32* 2.33*  CALCIUM  --  7.9* 8.0*  MG 1.7  --  1.9   Liver Function Tests:  Recent Labs  05/06/17 1901  AST 24  ALT 13*  ALKPHOS 69  BILITOT 0.7  PROT 7.1  ALBUMIN 3.8    Recent Labs  05/06/17 1901  LIPASE 24   CBC:  Recent Labs  05/06/17 1901 05/07/17 0637 05/08/17 0422  WBC 11.7* 15.1* 9.3  NEUTROABS 10.4*  --   --   HGB 12.2* 11.6* 10.4*  HCT 36.7* 35.0* 30.8*  MCV 89.9 88.0 87.7  PLT 203 208 184   Cardiac Enzymes:  Recent Labs  05/07/17 0356 05/07/17 0950 05/07/17 1640  TROPONINI 15.97* 21.51* 23.72*   BNP: Invalid input(s): POCBNP D-Dimer: No results for input(s): DDIMER in the last 72 hours. Hemoglobin A1C:  Recent Labs  05/07/17 0356  HGBA1C 7.7*   Fasting Lipid Panel:  Recent Labs  05/07/17 0637  CHOL 92  HDL 23*  LDLCALC 35  TRIG 168*  CHOLHDL 4.0   Thyroid Function Tests:  Recent Labs  05/07/17 0356  TSH 3.098   Anemia Panel: No results for input(s): VITAMINB12, FOLATE, FERRITIN, TIBC, IRON, RETICCTPCT in the last 72 hours.   Physical Exam: Blood pressure (!) 137/59, pulse 71,  temperature 98.2 F (36.8 C), temperature source Oral, resp. rate 18, height 5\' 7"  (1.702 m), weight 86.2 kg (190 lb 0.6 oz), SpO2 99 %.   Wt Readings from Last 1 Encounters:  05/07/17 86.2 kg (190 lb 0.6 oz)     General appearance: alert and cooperative Resp: clear to auscultation bilaterally Cardio: irregularly irregular rhythm GI: soft, non-tender; bowel sounds normal; no masses,  no organomegaly Extremities: extremities normal, atraumatic, no cyanosis or edema Neurologic: Grossly normal  TELEMETRY: Reviewed telemetry pt in afib with variable vr:  ASSESSMENT AND PLAN:  Active Problems:   NSTEMI (non-ST elevated myocardial infarction) (HCC)-elevated troponin . Demand ischemia vs aca. Had chest ct on admission with bump in creatinine to 2.33. Had chest and arm pain this am. Has gone into afib with rvr. Will attempt to improve rate with incrasing beta blocker. Will attempt to cool off  with iv ntg and will gently hydrate .Will attempt to defer cardiac cath until renla funciton imrpoves.  If he becomes unstable will need to proceed wiut will ty to control afib rate and cool off with meds.     Teodoro Spray, MD, [facility] 05/08/2017 9:25 AM

## 2017-05-08 NOTE — Progress Notes (Signed)
ANTICOAGULATION CONSULT NOTE - Initial Consult  Pharmacy Consult for Heparin  Indication: chest pain/ACS  Allergies  Allergen Reactions  . Peanut-Containing Drug Products Anaphylaxis  . Penicillins Hives and Rash    Has patient had a PCN reaction causing immediate rash, facial/tongue/throat swelling, SOB or lightheadedness with hypotension: Yes Has patient had a PCN reaction causing severe rash involving mucus membranes or skin necrosis: No Has patient had a PCN reaction that required hospitalization No Has patient had a PCN reaction occurring within the last 10 years: No If all of the above answers are "NO", then may proceed with Cephalosporin use.  . Bee Venom Swelling  . Influenza Vaccines Hives  . Inh [Isoniazid] Hives  . Kenalog [Triamcinolone Acetonide] Hives  . Nalfon [Fenoprofen Calcium] Hives    Patient Measurements: Height: 5\' 7"  (170.2 cm) Weight: 190 lb 0.6 oz (86.2 kg) IBW/kg (Calculated) : 66.1 Heparin Dosing Weight: 84.1 kg   Vital Signs: Temp: 99.1 F (37.3 C) (05/30 0400) Temp Source: Axillary (05/30 0400) BP: 124/54 (05/30 0400) Pulse Rate: 71 (05/30 0400)  Labs:  Recent Labs  05/06/17 1901  05/07/17 0356 05/07/17 0637 05/07/17 0950 05/07/17 1415 05/07/17 1640 05/08/17 0422  HGB 12.2*  --   --  11.6*  --   --   --  10.4*  HCT 36.7*  --   --  35.0*  --   --   --  30.8*  PLT 203  --   --  208  --   --   --  184  LABPROT  --   --   --  14.2  --   --   --   --   INR  --   --   --  1.10  --   --   --   --   HEPARINUNFRC  --   --   --  0.30  --  0.36  --  0.43  CREATININE 1.99*  --   --  2.32*  --   --   --  2.33*  TROPONINI 0.34*  < > 15.97*  --  21.51*  --  23.72*  --   < > = values in this interval not displayed.  Estimated Creatinine Clearance: 27.8 mL/min (A) (by C-G formula based on SCr of 2.33 mg/dL (H)).   Medical History: Past Medical History:  Diagnosis Date  . Arthritis   . Atrioventricular canal (AVC)    irregular heart beats  .  Barrett esophagus   . Cancer [facility]) 2002   prostate  . Colon polyp   . Diabetes mellitus without complication ([place])   . Diverticulosis   . Gout   . Heart disease   . Hemangioma    liver  . Hyperlipidemia   . Hypertension   . Myocardial infarct ([place])   . Ocular hypertension   . Peripheral vascular disease ([place])   . Skin cancer   . Sleep apnea   . Vitreoretinal degeneration     Medications:  Scheduled:  . allopurinol  200 mg Oral BID  . amLODipine  5 mg Oral BID  . aspirin  81 mg Oral Daily  . citalopram  10 mg Oral Daily  . cloNIDine  0.1 mg Oral QHS  . clopidogrel  75 mg Oral Daily  . furosemide  20 mg Intravenous BID  . insulin aspart  0-15 Units Subcutaneous Q4H  . insulin glargine  10 Units Subcutaneous QHS  . ipratropium-albuterol  3 mL Nebulization Q4H  .  isosorbide mononitrate  60 mg Oral Daily  . loratadine  10 mg Oral Daily  . metoprolol tartrate  25 mg Oral BID  . mometasone-formoterol  2 puff Inhalation BID  . pantoprazole (PROTONIX) IV  40 mg Intravenous Q12H  . rosuvastatin  40 mg Oral Daily  . sucralfate  1 g Oral TID WC & HS  . Vitamin D (Ergocalciferol)  50,000 Units Oral Q30 days    Assessment: Pharmacy consulted to dose heparin in this 76 year old male admitted with AECOPD and NSTEMI.   Goal of Therapy:  Heparin level 0.3-0.7 units/ml Monitor platelets by anticoagulation protocol: Yes   Plan:  Will continue heparin drip at 1100 units/hr and recheck HL and CBC in AM.   5/30 AM heparin level 0.43. Continue current regimen. Recheck heparin level and CBC with tomorrow AM labs.  Verlon Pischke S 05/08/2017,5:08 AM

## 2017-05-08 NOTE — Progress Notes (Signed)
Will tentively plan cardaic cath in am per Dr. Clayborn Bigness. Will review renal function prior to cath.

## 2017-05-08 NOTE — Progress Notes (Signed)
Name: Mike Decker MRN: [id number] DOB: 1941/07/09    ADMISSION DATE:  05/06/2017 CONSULTATION DATE: 05/07/2017  REFERRING MD : Dr. 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  Transferred from telemetry unit to ICU 05/29 with worsening acute respiratory failure requiring continuous Bipap and had a brief period of afib with RVR   SIGNIFICANT EVENTS  05/28-Pt admitted to the telemetry unit  05/29-Pt transferred to ICU  05/30- Increased CP with Afib/RVR, ST depressions.   STUDIES:  CT Abd Pelvis and CT Angio Chest 05/28>>No definite evidence pulmonary embolus. Aortic atherosclerosis. Coronary calcifications are noted suggesting coronary artery disease. Bilateral pulmonary edema is noted with minimal pleural effusions. Moderate size fat containing right inguinal hernia. 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 Dilated gallbladder is noted without evidence of inflammation. No cholelithiasis is noted.   Subjective: Patient is awake and alert today. Complains of left-sided chest pain with left arm pain.  PAST MEDICAL HISTORY :    Prior to Admission medications   Medication Sig Start Date End Date Taking? Authorizing Provider  acetaminophen (TYLENOL) 500 MG tablet Take 1,000 mg by mouth every 6 (six) hours as needed for mild pain, moderate pain or headache.   Yes [provider]  allopurinol (ZYLOPRIM) 100 MG tablet Take 200 mg by mouth 2 (two) times daily.    Yes [provider]  amLODipine (NORVASC) 5 MG tablet TAKE 1 TABLET (5 MG TOTAL) BY MOUTH 2 (TWO) TIMES DAILY. 04/02/17  Yes [provider]  aspirin (ASPIRIN CHILDRENS) 81 MG chewable tablet Chew 1 tablet (81 mg total) by mouth daily. 10/09/15  Yes Mody,  Ulice Bold, MD  budesonide-formoterol (SYMBICORT) 160-4.5 MCG/ACT inhaler Inhale 2 puffs into the lungs 2 (two) times daily.   Yes [provider]  cetirizine (ZYRTEC) 5 MG tablet Take 5 mg by mouth daily as needed for allergies or rhinitis.    Yes [provider]  Cholecalciferol (VITAMIN D3) 50000 UNITS CAPS Take 50,000 Units by mouth every 30 (thirty) days.   Yes [provider]  ciprofloxacin (CIPRO) 500 MG tablet Take 500 mg by mouth 2 (two) times daily. 05/05/17 05/14/17 Yes [provider]  citalopram (CELEXA) 10 MG tablet Take 10 mg by mouth daily.   Yes [provider]  cloNIDine (CATAPRES) 0.1 MG tablet Take 1 tablet (0.1 mg total) by mouth 2 (two) times daily. Patient taking differently: Take 0.1 mg by mouth at bedtime.  10/09/15  Yes Mody, Ulice Bold, MD  clopidogrel (PLAVIX) 75 MG tablet Take 1 tablet (75 mg total) by mouth daily. 10/09/15  Yes Mody, Ulice Bold, MD  colchicine 0.6 MG tablet Take 0.6 mg by mouth 3 (three) times daily.   Yes [provider]  dextromethorphan-guaiFENesin (MUCINEX DM) 30-600 MG 12hr tablet Take 1 tablet by mouth 2 (two) times daily as needed for cough.   Yes [provider]  EPINEPHrine (EPI-PEN) 0.3 mg/0.3 mL SOAJ injection Inject into the muscle as needed (anaphylaxis).    Yes [provider]  furosemide (LASIX) 20 MG tablet Take 20 mg by mouth 2 (two) times daily.    Yes [provider]  insulin aspart (NOVOLOG FLEXPEN) 100 UNIT/ML FlexPen Inject 14-20 Units into the skin See admin instructions. Inject 14 units every morning with breakfast and follow sliding  scale for the rest of meals. 08/30/15  Yes [provider]  insulin glargine (LANTUS) 100 UNIT/ML injection Inject 55 Units into the skin at bedtime.    Yes [provider]  isosorbide mononitrate (IMDUR) 120 MG 24 hr tablet Take 1 tablet (120 mg total) by mouth daily. Patient taking differently: Take 60 mg by mouth daily.   10/09/15  Yes Mody, Ulice Bold, MD  lisinopril (PRINIVIL,ZESTRIL) 10 MG tablet Take 10 mg by mouth daily.   Yes [provider]  metoprolol (LOPRESSOR) 50 MG tablet Take 25 mg by mouth 2 (two) times daily.    Yes [provider]  metroNIDAZOLE (FLAGYL) 250 MG tablet Take 250 mg by mouth 3 (three) times daily. 05/05/17 05/14/17 Yes [provider]  nitroGLYCERIN (NITROSTAT) 0.4 MG SL tablet Place 0.4 mg under the tongue every 5 (five) minutes x 3 doses as needed for chest pain.    Yes [provider]  pantoprazole (PROTONIX) 40 MG tablet Take 40 mg by mouth 2 (two) times daily.   Yes [provider]  polyethylene glycol powder (GLYCOLAX/MIRALAX) powder Use as directed 01/20/15  Yes [provider]  rosuvastatin (CRESTOR) 40 MG tablet Take 40 mg by mouth daily.   Yes [provider]  CARAFATE 1 GM/10ML suspension TAKE 2 TEASPOONSFUL (10MLS) BY MOUTH 4 TIMES A DAY BEFORE MEALS AND NIGHTLY FOR 14 DAYS 03/17/17   [provider]   Allergies  Allergen Reactions  . Peanut-Containing Drug Products Anaphylaxis  . Penicillins Hives and Rash    Has patient had a PCN reaction causing immediate rash, facial/tongue/throat swelling, SOB or lightheadedness with hypotension: Yes Has patient had a PCN reaction causing severe rash involving mucus membranes or skin necrosis: No Has patient had a PCN reaction that required hospitalization No Has patient had a PCN reaction occurring within the last 10 years: No If all of the above answers are "NO", then may proceed with Cephalosporin use.  . Bee Venom Swelling  . Influenza Vaccines Hives  . Inh [Isoniazid] Hives  . Kenalog [Triamcinolone Acetonide] Hives  . Nalfon [Fenoprofen Calcium] Hives     VITAL SIGNS: Temp:  [98.2 F (36.8 C)-99.1 F (37.3 C)] 98.2 F (36.8 C) (05/30 0800) Pulse Rate:  [63-88] 71 (05/30 0800) Resp:  [15-29] 18 (05/30 0800) BP: (116-148)/(47-62) 137/59 (05/30 0800) SpO2:   [92 %-99 %] 99 % (05/30 0800) FiO2 (%):  [50 %-90 %] 50 % (05/30 0845)  PHYSICAL EXAMINATION: General: well developed, well nourished Caucasian male, with dyspnea.  Neuro: alert and oriented, follows commands HEENT: supple, JVD present  Cardiovascular: nsr, s1s2, no M/R/G Lungs: continued diffuse crackles with expiratory wheezes throughout, labored on continuous Bipap Abdomen: +BS x4, soft, obese, non distended, non tender Musculoskeletal: normal bulk and tone, no edema  Skin: intact no rashes or lesions    Recent Labs Lab 05/06/17 1901 05/07/17 0637 05/08/17 0422  NA 135 136 141  K 3.5 4.3 3.5  CL 101 103 105  CO2 22 20* 25  BUN 37* 44* 49*  CREATININE 1.99* 2.32* 2.33*  GLUCOSE 120* 238* 184*    Recent Labs Lab 05/06/17 1901 05/07/17 0637 05/08/17 0422  HGB 12.2* 11.6* 10.4*  HCT 36.7* 35.0* 30.8*  WBC 11.7* 15.1* 9.3  PLT 203 208 184     ASSESSMENT / PLAN: 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 Afib with RVR-  NSTEMI Acute on chronic renal failure Nausea and Diarrhea  Hx: OSA (Bipap qhs), CKD Stage III, Diverticulosis, Barrett Esophagus, HTN, Hyperlipidemia, HTN, and MI  P: PAP Daily at bedtime, continue to wean down high flow oxygen during the day. Scheduled bronchodilator therapy  Trend troponin's Lipid panel pending  Continue aspirin, catapres, plavix, lasix, imdur, lisinopril, and lopressor Prn iv morphine for pain management  D/w cardiology, metoprolol increased.  Continue heparin gtt dosing per pharmacy Trend CBC's Trend BMP's Replace electrolytes as indicated Monitor uop Nephrology consulted appreciate input  Monitor for s/sx of bleeding Transfuse for hgb <7 Cdiff and GI panel pending Protonix for PUD prophylaxis  Follow cultures  SSI and lantus qhs    -Marda Stalker, M.D.   05/08/2017  [place].  I have personally obtained a history, examined the patient, evaluated laboratory and imaging results, formulated the assessment and plan and placed orders. The Patient requires high complexity decision making for assessment and support, frequent evaluation and titration of therapies, application of advanced monitoring technologies and extensive interpretation of multiple databases. The patient has critical illness that could lead imminently to failure of 1 or more organ systems and requires the highest level of physician preparedness to intervene.  Critical Care Time devoted to patient care services described in this note is 35 minutes and is exclusive of time spent in procedures.

## 2017-05-08 NOTE — Progress Notes (Signed)
[facility] [place] [facility] [place] NAME: Mike Decker    MR#:  [id number]  DATE OF BIRTH:  23-Dec-1940  SUBJECTIVE:  CHIEF COMPLAINT:   Chief Complaint  Patient presents with  . Diarrhea  . Emesis     Came with GI symptoms, SOB.   Have High troponin, on heparin drip.   Required bipap, now on HFNC.   Had A fib with RVR.  REVIEW OF SYSTEMS:  CONSTITUTIONAL: No fever, fatigue or weakness.  EYES: No blurred or double vision.  EARS, NOSE, AND THROAT: No tinnitus or ear pain.  RESPIRATORY: No cough, positive for shortness of breath,no wheezing or hemoptysis.  CARDIOVASCULAR: No chest pain, orthopnea, edema.  GASTROINTESTINAL: No nausea, vomiting, diarrhea or abdominal pain.  GENITOURINARY: No dysuria, hematuria.  ENDOCRINE: No polyuria, nocturia,  HEMATOLOGY: No anemia, easy bruising or bleeding SKIN: No rash or lesion. MUSCULOSKELETAL: No joint pain or arthritis.   NEUROLOGIC: No tingling, numbness, weakness.  PSYCHIATRY: No anxiety or depression.   ROS  DRUG ALLERGIES:   Allergies  Allergen Reactions  . Peanut-Containing Drug Products Anaphylaxis  . Penicillins Hives and Rash    Has patient had a PCN reaction causing immediate rash, facial/tongue/throat swelling, SOB or lightheadedness with hypotension: Yes Has patient had a PCN reaction causing severe rash involving mucus membranes or skin necrosis: No Has patient had a PCN reaction that required hospitalization No Has patient had a PCN reaction occurring within the last 10 years: No If all of the above answers are "NO", then may proceed with Cephalosporin use.  . Bee Venom Swelling  . Influenza Vaccines Hives  . Inh [Isoniazid] Hives  . Kenalog [Triamcinolone Acetonide] Hives  . Nalfon [Fenoprofen Calcium] Hives    VITALS:  Blood pressure (!) 118/55, pulse 70, temperature 97.5 F (36.4 C), temperature source Oral, resp. rate (!) 22, height 5\' 7"  (1.702 m), weight 86.2 kg (190 lb 0.6  oz), SpO2 92 %.  PHYSICAL EXAMINATION:  GENERAL:  76 y.o.-year-old patient lying in the bed with no acute distress.  EYES: Pupils equal, round, reactive to light and accommodation. No scleral icterus. Extraocular muscles intact.  HEENT: Head atraumatic, normocephalic. Oropharynx and nasopharynx clear.  NECK:  Supple, no jugular venous distention. No thyroid enlargement, no tenderness.  LUNGS: Normal breath sounds bilaterally, no wheezing, bilateral crepitation. No use of accessory muscles of respiration. Using HFNC today. CARDIOVASCULAR: S1, S2 normal. No murmurs, rubs, or gallops.  ABDOMEN: Soft, nontender, nondistended. Bowel sounds present. No organomegaly or mass.  EXTREMITIES: No pedal edema, cyanosis, or clubbing.  NEUROLOGIC: Cranial nerves II through XII are intact. Muscle strength 5/5 in all extremities. Sensation intact. Gait not checked.  PSYCHIATRIC: The patient is alert and oriented x 3.  SKIN: No obvious rash, lesion, or ulcer.   Physical Exam LABORATORY PANEL:   CBC  Recent Labs Lab 05/08/17 0422  WBC 9.3  HGB 10.4*  HCT 30.8*  PLT 184   ------------------------------------------------------------------------------------------------------------------  Chemistries   Recent Labs Lab 05/06/17 1901  05/08/17 0422  NA 135  < > 141  K 3.5  < > 3.5  CL 101  < > 105  CO2 22  < > 25  GLUCOSE 120*  < > 184*  BUN 37*  < > 49*  CREATININE 1.99*  < > 2.33*  CALCIUM 8.4*  < > 8.0*  MG  --   < > 1.9  AST 24  --   --   ALT 13*  --   --  ALKPHOS 69  --   --   BILITOT 0.7  --   --   < > = values in this interval not displayed. ------------------------------------------------------------------------------------------------------------------  Cardiac Enzymes  Recent Labs Lab 05/07/17 0950 05/07/17 1640  TROPONINI 21.51* 23.72*   ------------------------------------------------------------------------------------------------------------------  RADIOLOGY:  Dg  Chest 1 View  Result Date: 05/08/2017 CLINICAL DATA:  Dyspnea EXAM: CHEST 1 VIEW COMPARISON:  Chest x-ray of May 07, 2017 FINDINGS: The lungs are adequately inflated. The pulmonary interstitial markings have improved significantly. The pulmonary vascularity is less engorged. The cardiac silhouette remains mildly enlarged. There is no pleural effusion or alveolar infiltrate. IMPRESSION: Improving CHF with decreased interstitial edema. Electronically Signed   By: David  Martinique M.D.   On: 05/08/2017 07:10   Ct Angio Chest Pe W Or Wo Contrast  Result Date: 05/06/2017 CLINICAL DATA:  Tachypneic.  Left lower quadrant abdominal pain. EXAM: CT ANGIOGRAPHY CHEST CT ABDOMEN AND PELVIS WITH CONTRAST TECHNIQUE: Multidetector CT imaging of the chest was performed using the standard protocol during bolus administration of intravenous contrast. Multiplanar CT image reconstructions and MIPs were obtained to evaluate the vascular anatomy. Multidetector CT imaging of the abdomen and pelvis was performed using the standard protocol during bolus administration of intravenous contrast. CONTRAST:  11mL ISOVUE-300 IOPAMIDOL (ISOVUE-300) INJECTION 61% COMPARISON:  CT scan of May 02, 2016 and April 06, 2014. FINDINGS: CTA CHEST FINDINGS Cardiovascular: Satisfactory opacification of the pulmonary arteries to the segmental level. No evidence of pulmonary embolism. Normal heart size. No pericardial effusion. Atherosclerosis of thoracic aorta is noted without aneurysm. Coronary artery calcifications are noted. Mediastinum/Nodes: No enlarged mediastinal, hilar, or axillary lymph nodes. Thyroid gland, trachea, and esophagus demonstrate no significant findings. Lungs/Pleura: No pneumothorax is noted. Minimal bilateral pleural effusions are noted. Bilateral interstitial densities are noted throughout both lungs most consistent with pulmonary edema. Musculoskeletal: No chest wall abnormality. No acute or significant osseous findings. Review of  the MIP images confirms the above findings. CT ABDOMEN and PELVIS FINDINGS Hepatobiliary: Dilated gallbladder is noted but no cholelithiasis or inflammatory changes are noted. 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 Pancreas: Unremarkable. No pancreatic ductal dilatation or surrounding inflammatory changes. Spleen: Normal in size without focal abnormality. Adrenals/Urinary Tract: Adrenal glands are unremarkable. Kidneys are normal, without renal calculi, focal lesion, or hydronephrosis. Bladder is unremarkable. Stomach/Bowel: Stomach is within normal limits. Appendix appears normal. No evidence of bowel wall thickening, distention, or inflammatory changes. Sigmoid diverticulosis is noted. Vascular/Lymphatic: Aortic atherosclerosis. No enlarged abdominal or pelvic lymph nodes. 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 Reproductive: Status post prostatectomy. Other: Moderate size fat containing right inguinal hernia is noted. Musculoskeletal: Stable left acetabular enostosis. No other significant abnormality seen. Review of the MIP images confirms the above findings. IMPRESSION: No definite evidence pulmonary embolus. Aortic atherosclerosis. Coronary calcifications are noted suggesting coronary artery disease. Bilateral pulmonary edema is noted with minimal pleural effusions. Moderate size fat containing right inguinal hernia. 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 Dilated gallbladder is noted without evidence of inflammation. No cholelithiasis is noted. Electronically Signed   By: Marijo Conception, M.D.   On: 05/06/2017 21:10   Ct Abdomen Pelvis W Contrast  Result Date: 05/06/2017 CLINICAL DATA:  Tachypneic.  Left lower quadrant abdominal pain. EXAM: CT ANGIOGRAPHY CHEST CT ABDOMEN AND PELVIS WITH CONTRAST TECHNIQUE: Multidetector CT imaging of the chest was performed using the standard protocol during bolus  administration of intravenous contrast. Multiplanar CT  image reconstructions and MIPs were obtained to evaluate the vascular anatomy. Multidetector CT imaging of the abdomen and pelvis was performed using the standard protocol during bolus administration of intravenous contrast. CONTRAST:  88mL ISOVUE-300 IOPAMIDOL (ISOVUE-300) INJECTION 61% COMPARISON:  CT scan of May 02, 2016 and April 06, 2014. FINDINGS: CTA CHEST FINDINGS Cardiovascular: Satisfactory opacification of the pulmonary arteries to the segmental level. No evidence of pulmonary embolism. Normal heart size. No pericardial effusion. Atherosclerosis of thoracic aorta is noted without aneurysm. Coronary artery calcifications are noted. Mediastinum/Nodes: No enlarged mediastinal, hilar, or axillary lymph nodes. Thyroid gland, trachea, and esophagus demonstrate no significant findings. Lungs/Pleura: No pneumothorax is noted. Minimal bilateral pleural effusions are noted. Bilateral interstitial densities are noted throughout both lungs most consistent with pulmonary edema. Musculoskeletal: No chest wall abnormality. No acute or significant osseous findings. Review of the MIP images confirms the above findings. CT ABDOMEN and PELVIS FINDINGS Hepatobiliary: Dilated gallbladder is noted but no cholelithiasis or inflammatory changes are noted. 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 Pancreas: Unremarkable. No pancreatic ductal dilatation or surrounding inflammatory changes. Spleen: Normal in size without focal abnormality. Adrenals/Urinary Tract: Adrenal glands are unremarkable. Kidneys are normal, without renal calculi, focal lesion, or hydronephrosis. Bladder is unremarkable. Stomach/Bowel: Stomach is within normal limits. Appendix appears normal. No evidence of bowel wall thickening, distention, or inflammatory changes. Sigmoid diverticulosis is noted. Vascular/Lymphatic: Aortic atherosclerosis. No enlarged abdominal or pelvic lymph nodes.  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 Reproductive: Status post prostatectomy. Other: Moderate size fat containing right inguinal hernia is noted. Musculoskeletal: Stable left acetabular enostosis. No other significant abnormality seen. Review of the MIP images confirms the above findings. IMPRESSION: No definite evidence pulmonary embolus. Aortic atherosclerosis. Coronary calcifications are noted suggesting coronary artery disease. Bilateral pulmonary edema is noted with minimal pleural effusions. Moderate size fat containing right inguinal hernia. 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 Dilated gallbladder is noted without evidence of inflammation. No cholelithiasis is noted. Electronically Signed   By: Marijo Conception, M.D.   On: 05/06/2017 21:10   Dg Chest Port 1 View  Result Date: 05/07/2017 CLINICAL DATA:  Acute onset of shortness of breath. Initial encounter. EXAM: PORTABLE CHEST 1 VIEW COMPARISON:  Chest radiograph and CTA of the chest performed 05/06/2017 FINDINGS: Vascular congestion is noted, with bilateral central airspace opacification compatible with pulmonary edema. No definite pleural effusion or pneumothorax is seen. The cardiomediastinal silhouette is borderline enlarged. No acute osseous abnormalities identified. IMPRESSION: Vascular congestion and borderline cardiomegaly. New bilateral central airspace opacification is compatible with pulmonary edema. Electronically Signed   By: Garald Balding M.D.   On: 05/07/2017 06:14   Dg Chest Port 1 View  Result Date: 05/06/2017 CLINICAL DATA:  Dyspnea, vomiting and diarrhea x2 days. EXAM: PORTABLE CHEST 1 VIEW COMPARISON:  03/23/2013 FINDINGS: Borderline cardiomegaly. No aortic aneurysm. No pneumonic consolidation or CHF. No effusion or pneumothorax. Mild hyperinflation of the lungs, upper lobe predominant and left greater than right. IMPRESSION: Mild  hyperinflation of the lungs, more so in the left upper lobe. Electronically Signed   By: Ashley Royalty M.D.   On: 05/06/2017 19:25    ASSESSMENT AND PLAN:   Active Problems:   NSTEMI (non-ST elevated myocardial infarction) ([place])  #. NSTEMI, history of coronary artery disease -  with telemetry monitoring, continuous pulse ox - Heparin drip -Continue Lopressor, nitroglycerin, Imdur, Plavix, Crestor - Trend troponins, check lipids and TSH. -  Check echo - Cardiology consult appreciated. - plan is for Cath tomorrow, if renal func stable, as he again had some chest pain.  #. Hypoxia- ac respi failure, Pulmonary edema with pleural effusion, likely secondary to ac diastolic CHF. Now also have A fib with RVR. - Intake/output, daily weight. - Mednebs, O2 therapy as needed - Add on BNP -Continue Lasix - on bipap, Done and reviewed Echo , cardio consult.  #. Abdominal pain, unclear etiology possibly viral gastroenteritis given diarrhea - Checked stool studies - salmonella- on cipro. - Antiemetics and antidiarrheals as needed  #. CKD3, Creatinine near baseline - Monitor BMP as patient had IV contrast - nephrologist on case.  #. H/o Diabetes - Accuchecks q4h with RISS coverage - Continue Lantus at half dose - CHeck A1c  #. History of COPD -Continue Symbicort, duoneb.   #. History of hypertension -Continue Norvasc, Lopressor, Catapres, wills top lisinopril due to worsening renal func.  #. History of Barrett's esophagus/GERD -Continue Carafate, Protonix  #. History of depression -Continue Celexa  #. History of gout -Continue allopurinol, colchicine  #. History of obstructive sleep apnea -Continue CPAP at night per home regimen   All the records are reviewed and case discussed with Care Management/Social Workerr. Management plans discussed with the patient, family and they are in agreement.  CODE STATUS: Full.  TOTAL TIME TAKING CARE OF THIS PATIENT: 40 minutes.      POSSIBLE D/C IN 2-3 DAYS, DEPENDING ON CLINICAL CONDITION.   Vaughan Basta M.D on 05/08/2017   Between 7am to 6pm - Pager - [phone]  After 6pm go to [email or web address] - password [email or web address] [facility]  Office  [phone]  CC: Primary care physician; Cletis Athens, MD  Note: This dictation was prepared with Dragon dictation along with smaller phrase technology. Any transcriptional errors that result from this process are unintentional.

## 2017-05-08 NOTE — Plan of Care (Signed)
Problem: Education: Goal: Knowledge of [facility] Education information/materials will improve Outcome: Progressing Pt was in NSR overnight on a Heparin gtt. Pt was placed on BiPAP around midnight.

## 2017-05-09 ENCOUNTER — Encounter
Admission: EM | Disposition: A | Discharge status: Home Health | Payer: Self-pay | Source: Home / Self Care | Attending: Internal Medicine

## 2017-05-09 DIAGNOSIS — J810 Acute pulmonary edema: Secondary | ICD-10-CM

## 2017-05-09 HISTORY — PX: LEFT HEART CATH AND CORONARY ANGIOGRAPHY: CATH118249

## 2017-05-09 LAB — GLUCOSE, CAPILLARY
GLUCOSE-CAPILLARY: 161 mg/dL — AB (ref 65–99)
Glucose-Capillary: 162 mg/dL — ABNORMAL HIGH (ref 65–99)
Glucose-Capillary: 165 mg/dL — ABNORMAL HIGH (ref 65–99)
Glucose-Capillary: 187 mg/dL — ABNORMAL HIGH (ref 65–99)
Glucose-Capillary: 189 mg/dL — ABNORMAL HIGH (ref 65–99)
Glucose-Capillary: 230 mg/dL — ABNORMAL HIGH (ref 65–99)

## 2017-05-09 LAB — BASIC METABOLIC PANEL
Anion gap: 8 (ref 5–15)
BUN: 45 mg/dL — ABNORMAL HIGH (ref 6–20)
CHLORIDE: 105 mmol/L (ref 101–111)
CO2: 26 mmol/L (ref 22–32)
CREATININE: 1.99 mg/dL — AB (ref 0.61–1.24)
Calcium: 7.9 mg/dL — ABNORMAL LOW (ref 8.9–10.3)
GFR calc non Af Amer: 31 mL/min — ABNORMAL LOW (ref >60)
GFR, EST AFRICAN AMERICAN: 36 mL/min — AB (ref >60)
GLUCOSE: 170 mg/dL — AB (ref 65–99)
Potassium: 3.4 mmol/L — ABNORMAL LOW (ref 3.5–5.1)
Sodium: 139 mmol/L (ref 135–145)

## 2017-05-09 LAB — CBC
HEMATOCRIT: 29 % — AB (ref 40.0–52.0)
Hemoglobin: 9.9 g/dL — ABNORMAL LOW (ref 13.0–18.0)
MCH: 30.2 pg (ref 26.0–34.0)
MCHC: 34.1 g/dL (ref 32.0–36.0)
MCV: 88.7 fL (ref 80.0–100.0)
Platelets: 208 10*3/uL (ref 150–440)
RBC: 3.27 MIL/uL — ABNORMAL LOW (ref 4.40–5.90)
RDW: 14.8 % — ABNORMAL HIGH (ref 11.5–14.5)
WBC: 8.5 10*3/uL (ref 3.8–10.6)

## 2017-05-09 LAB — HEPARIN LEVEL (UNFRACTIONATED): Heparin Unfractionated: 0.25 IU/mL — ABNORMAL LOW (ref 0.30–0.70)

## 2017-05-09 LAB — MAGNESIUM: Magnesium: 1.9 mg/dL (ref 1.7–2.4)

## 2017-05-09 SURGERY — LEFT HEART CATH AND CORONARY ANGIOGRAPHY
Anesthesia: Moderate Sedation

## 2017-05-09 MED ORDER — ACETAMINOPHEN 325 MG PO TABS: 650.0000 mg | ORAL_TABLET | ORAL | Status: DC | PRN

## 2017-05-09 MED ORDER — SODIUM CHLORIDE 0.9% FLUSH: 3.0000 mL | INTRAVENOUS | Status: DC | PRN

## 2017-05-09 MED ORDER — IPRATROPIUM-ALBUTEROL 0.5-2.5 (3) MG/3ML IN SOLN
3.0000 mL | Freq: Once | RESPIRATORY_TRACT | Status: AC
  Administered 2017-05-09: 3 mL via RESPIRATORY_TRACT

## 2017-05-09 MED ORDER — SODIUM CHLORIDE 0.9% FLUSH: 3.0000 mL | Freq: Two times a day (BID) | INTRAVENOUS | Status: DC

## 2017-05-09 MED ORDER — CHLORHEXIDINE GLUCONATE 0.12 % MT SOLN
15.0000 mL | Freq: Two times a day (BID) | OROMUCOSAL | Status: DC
  Administered 2017-05-09 – 2017-05-15 (×8): 15 mL via OROMUCOSAL
  Filled 2017-05-09 (×8): qty 15

## 2017-05-09 MED ORDER — HEPARIN BOLUS VIA INFUSION
1300.0000 [IU] | Freq: Once | INTRAVENOUS | Status: AC
  Administered 2017-05-09: 1300 [IU] via INTRAVENOUS
  Filled 2017-05-09: qty 1300

## 2017-05-09 MED ORDER — IOPAMIDOL (ISOVUE-300) INJECTION 61%
INTRAVENOUS | Status: DC | PRN
  Administered 2017-05-09: 75 mL via INTRA_ARTERIAL

## 2017-05-09 MED ORDER — SODIUM CHLORIDE 0.9 % IV SOLN: INTRAVENOUS | Status: DC

## 2017-05-09 MED ORDER — ASPIRIN 81 MG PO CHEW: 81.0000 mg | CHEWABLE_TABLET | ORAL | Status: DC

## 2017-05-09 MED ORDER — ENOXAPARIN SODIUM 40 MG/0.4ML ~~LOC~~ SOLN
40.0000 mg | SUBCUTANEOUS | Status: DC
  Administered 2017-05-09 – 2017-05-14 (×6): 40 mg via SUBCUTANEOUS
  Filled 2017-05-09 (×6): qty 0.4

## 2017-05-09 MED ORDER — SODIUM CHLORIDE 0.9 % IV SOLN: 250.0000 mL | INTRAVENOUS | Status: DC | PRN

## 2017-05-09 MED ORDER — ONDANSETRON HCL 4 MG/2ML IJ SOLN: 4.0000 mg | Freq: Four times a day (QID) | INTRAMUSCULAR | Status: DC | PRN

## 2017-05-09 MED ORDER — ORAL CARE MOUTH RINSE
15.0000 mL | Freq: Two times a day (BID) | OROMUCOSAL | Status: DC
  Administered 2017-05-09 – 2017-05-11 (×5): 15 mL via OROMUCOSAL

## 2017-05-09 MED ORDER — CIPROFLOXACIN IN D5W 400 MG/200ML IV SOLN
400.0000 mg | Freq: Two times a day (BID) | INTRAVENOUS | Status: DC
  Administered 2017-05-09: 400 mg via INTRAVENOUS
  Filled 2017-05-09 (×3): qty 200

## 2017-05-09 SURGICAL SUPPLY — 10 items
CATH INFINITI 5FR ANG PIGTAIL (CATHETERS) ×2 IMPLANT
CATH INFINITI 5FR JL4 (CATHETERS) ×2 IMPLANT
CATH INFINITI JR4 5F (CATHETERS) ×2 IMPLANT
DEVICE CLOSURE MYNXGRIP 5F (Vascular Products) ×2 IMPLANT
DEVICE SAFEGUARD 24CM (GAUZE/BANDAGES/DRESSINGS) ×2 IMPLANT
KIT MANI 3VAL PERCEP (MISCELLANEOUS) ×2 IMPLANT
NEEDLE PERC 18GX7CM (NEEDLE) ×2 IMPLANT
PACK CARDIAC CATH (CUSTOM PROCEDURE TRAY) ×2 IMPLANT
SHEATH AVANTI 5FR X 11CM (SHEATH) ×2 IMPLANT
WIRE EMERALD 3MM-J .035X150CM (WIRE) ×2 IMPLANT

## 2017-05-09 NOTE — Progress Notes (Signed)
PHARMACY NOTE -  ANTIBIOTIC RENAL DOSE ADJUSTMENT   Request received for Pharmacy to assist with antibiotic renal dose adjustment.  Patient has been initiated on ciprofloxacin for Salmonella. SCr 1.99, estimated CrCl 32.6 ml/min Will increase ciprofloxacin dosing to 400 mg iv q 12 hours.  After discussion with Dr. Ashby Dawes, will stop ciprofloxacin after a total of 7 days of therapy.   Ulice Dash, PharmD Clinical Pharmacist

## 2017-05-09 NOTE — Progress Notes (Signed)
Pt. Was taken to the cath lab and back to the CCU while on Bipap.

## 2017-05-09 NOTE — Progress Notes (Signed)
       [facility]  SUBJECTIVE: feels better   Vitals:   05/09/17 0200 05/09/17 0300 05/09/17 0337 05/09/17 0400  BP: (!) 113/53 124/72  (!) 109/48  Pulse: 68 73  68  Resp: 18 16  (!) 25  Temp:    98.8 F (37.1 C)  TempSrc:    Oral  SpO2: 93% 95% 93% 91%  Weight:      Height:        Intake/Output Summary (Last 24 hours) at 05/09/17 0656 Last data filed at 05/09/17 0400  Gross per 24 hour  Intake          1154.16 ml  Output             1675 ml  Net          -520.84 ml    LABS: Basic Metabolic Panel:  Recent Labs  05/08/17 0422 05/09/17 0549  NA 141 139  K 3.5 3.4*  CL 105 105  CO2 25 26  GLUCOSE 184* 170*  BUN 49* 45*  CREATININE 2.33* 1.99*  CALCIUM 8.0* 7.9*  MG 1.9 1.9   Liver Function Tests:  Recent Labs  05/06/17 1901  AST 24  ALT 13*  ALKPHOS 69  BILITOT 0.7  PROT 7.1  ALBUMIN 3.8    Recent Labs  05/06/17 1901  LIPASE 24   CBC:  Recent Labs  05/06/17 1901  05/08/17 0422 05/09/17 0549  WBC 11.7*  < > 9.3 8.5  NEUTROABS 10.4*  --   --   --   HGB 12.2*  < > 10.4* 9.9*  HCT 36.7*  < > 30.8* 29.0*  MCV 89.9  < > 87.7 88.7  PLT 203  < > 184 208  < > = values in this interval not displayed. Cardiac Enzymes:  Recent Labs  05/07/17 0356 05/07/17 0950 05/07/17 1640  TROPONINI 15.97* 21.51* 23.72*   BNP: Invalid input(s): POCBNP D-Dimer: No results for input(s): DDIMER in the last 72 hours. Hemoglobin A1C:  Recent Labs  05/07/17 0356  HGBA1C 7.7*   Fasting Lipid Panel:  Recent Labs  05/07/17 0637  CHOL 92  HDL 23*  LDLCALC 35  TRIG 168*  CHOLHDL 4.0   Thyroid Function Tests:  Recent Labs  05/07/17 0356  TSH 3.098   Anemia Panel: No results for input(s): VITAMINB12, FOLATE, FERRITIN, TIBC, IRON, RETICCTPCT in the last 72 hours.   Physical Exam: Blood pressure (!) 109/48, pulse 68, temperature 98.8 F (37.1 C), temperature source Oral, resp. rate (!) 25, height  5\' 7"  (1.702 m), weight 86.2 kg (190 lb 0.6 oz), SpO2 91 %.   Wt Readings from Last 1 Encounters:  05/07/17 86.2 kg (190 lb 0.6 oz)     General appearance: alert and cooperative Resp: clear to auscultation bilaterally Cardio: regular rate and rhythm  TELEMETRY: Reviewed telemetry pt in *nsr:  ASSESSMENT AND PLAN:  Active Problems:   NSTEMI (non-ST elevated myocardial infarction) ([facility])-ruled in for nstemi. Renal funciton improved. Will proceed with left cardiac cath per Dr. Clayborn Bigness this afternoon    Teodoro Spray, MD, [facility] 05/09/2017 6:56 AM

## 2017-05-09 NOTE — Progress Notes (Signed)
[place] [facility] [place] NAME: Mike Decker    MR#:  [id number]  DATE OF BIRTH:  18-Dec-1940  SUBJECTIVE:  CHIEF COMPLAINT:   Chief Complaint  Patient presents with  . Diarrhea  . Emesis     Came with GI symptoms, SOB.   Have High troponin, on heparin drip.   Required bipap, now on HFNC.   Had A fib with RVR. Now converted to NSR.  REVIEW OF SYSTEMS:  CONSTITUTIONAL: No fever, fatigue or weakness.  EYES: No blurred or double vision.  EARS, NOSE, AND THROAT: No tinnitus or ear pain.  RESPIRATORY: No cough, positive for shortness of breath,no wheezing or hemoptysis.  CARDIOVASCULAR: No chest pain, orthopnea, edema.  GASTROINTESTINAL: No nausea, vomiting, diarrhea or abdominal pain.  GENITOURINARY: No dysuria, hematuria.  ENDOCRINE: No polyuria, nocturia,  HEMATOLOGY: No anemia, easy bruising or bleeding SKIN: No rash or lesion. MUSCULOSKELETAL: No joint pain or arthritis.   NEUROLOGIC: No tingling, numbness, weakness.  PSYCHIATRY: No anxiety or depression.   ROS  DRUG ALLERGIES:   Allergies  Allergen Reactions  . Peanut-Containing Drug Products Anaphylaxis  . Penicillins Hives and Rash    Has patient had a PCN reaction causing immediate rash, facial/tongue/throat swelling, SOB or lightheadedness with hypotension: Yes Has patient had a PCN reaction causing severe rash involving mucus membranes or skin necrosis: No Has patient had a PCN reaction that required hospitalization No Has patient had a PCN reaction occurring within the last 10 years: No If all of the above answers are "NO", then may proceed with Cephalosporin use.  . Bee Venom Swelling  . Influenza Vaccines Hives  . Inh [Isoniazid] Hives  . Kenalog [Triamcinolone Acetonide] Hives  . Nalfon [Fenoprofen Calcium] Hives    VITALS:  Blood pressure 115/65, pulse 80, temperature 100.1 F (37.8 C), temperature source Axillary, resp. rate 20, height 5\' 7"  (1.702 m), weight  86.2 kg (190 lb 0.6 oz), SpO2 92 %.  PHYSICAL EXAMINATION:  GENERAL:  76 y.o.-year-old patient lying in the bed with no acute distress.  EYES: Pupils equal, round, reactive to light and accommodation. No scleral icterus. Extraocular muscles intact.  HEENT: Head atraumatic, normocephalic. Oropharynx and nasopharynx clear.  NECK:  Supple, no jugular venous distention. No thyroid enlargement, no tenderness.  LUNGS: Normal breath sounds bilaterally, no wheezing, bilateral crepitation. No use of accessory muscles of respiration. Using HFNC today. CARDIOVASCULAR: S1, S2 normal. No murmurs, rubs, or gallops.  ABDOMEN: Soft, nontender, nondistended. Bowel sounds present. No organomegaly or mass.  EXTREMITIES: No pedal edema, cyanosis, or clubbing.  NEUROLOGIC: Cranial nerves II through XII are intact. Muscle strength 5/5 in all extremities. Sensation intact. Gait not checked.  PSYCHIATRIC: The patient is alert and oriented x 3.  SKIN: No obvious rash, lesion, or ulcer.   Physical Exam LABORATORY PANEL:   CBC  Recent Labs Lab 05/09/17 0549  WBC 8.5  HGB 9.9*  HCT 29.0*  PLT 208   ------------------------------------------------------------------------------------------------------------------  Chemistries   Recent Labs Lab 05/06/17 1901  05/09/17 0549  NA 135  < > 139  K 3.5  < > 3.4*  CL 101  < > 105  CO2 22  < > 26  GLUCOSE 120*  < > 170*  BUN 37*  < > 45*  CREATININE 1.99*  < > 1.99*  CALCIUM 8.4*  < > 7.9*  MG  --   < > 1.9  AST 24  --   --   ALT 13*  --   --  ALKPHOS 69  --   --   BILITOT 0.7  --   --   < > = values in this interval not displayed. ------------------------------------------------------------------------------------------------------------------  Cardiac Enzymes  Recent Labs Lab 05/07/17 0950 05/07/17 1640  TROPONINI 21.51* 23.72*    ------------------------------------------------------------------------------------------------------------------  RADIOLOGY:  Dg Chest 1 View  Result Date: 05/08/2017 CLINICAL DATA:  Dyspnea EXAM: CHEST 1 VIEW COMPARISON:  Chest x-ray of May 07, 2017 FINDINGS: The lungs are adequately inflated. The pulmonary interstitial markings have improved significantly. The pulmonary vascularity is less engorged. The cardiac silhouette remains mildly enlarged. There is no pleural effusion or alveolar infiltrate. IMPRESSION: Improving CHF with decreased interstitial edema. Electronically Signed   By: David  Martinique M.D.   On: 05/08/2017 07:10    ASSESSMENT AND PLAN:   Active Problems:   NSTEMI (non-ST elevated myocardial infarction) ([place])  #. NSTEMI, history of coronary artery disease -  with telemetry monitoring, continuous pulse ox - Heparin drip -Continue Lopressor, nitroglycerin, Imdur, Plavix, Crestor - Trend troponins, check lipids and TSH. - Checked echo- EF 55% - Cardiology consult appreciated. - plan is for Cath as renal func stable, as he again had some chest pain.  #. Hypoxia- ac respi failure, Pulmonary edema with pleural effusion, secondary to ac diastolic CHF. Now also have A fib with RVR. - Intake/output, daily weight. Lasix. - Mednebs, O2 therapy as needed - on bipap, Done and reviewed Echo , cardio consult appreciated.  #. Abdominal pain, unclear etiology possibly viral gastroenteritis given diarrhea - Checked stool studies - salmonella- on cipro. - Antiemetics and antidiarrheals as needed  #. CKD3, Creatinine near baseline - Monitor BMP as patient had IV contrast - nephrologist on case. - may give some Iv fluids after cath.  #. H/o Diabetes - Accuchecks q4h with RISS coverage - Continue Lantus at half dose - Hgb A1c- 7.7  #. History of COPD -Continue Symbicort, duoneb.   #. History of hypertension -Continue Norvasc, Lopressor, Catapres, wills top  lisinopril due to worsening renal func.  #. History of Barrett's esophagus/GERD -Continue Carafate, Protonix  #. History of depression -Continue Celexa  #. History of gout -Continue allopurinol, colchicine  #. History of obstructive sleep apnea -Continue CPAP at night per home regimen   All the records are reviewed and case discussed with Care Management/Social Workerr. Management plans discussed with the patient, family and they are in agreement.  CODE STATUS: Full.  TOTAL TIME TAKING CARE OF THIS PATIENT: 40 minutes.   Pt's wife was present in room today.  POSSIBLE D/C IN 2-3 DAYS, DEPENDING ON CLINICAL CONDITION.   Vaughan Basta M.D on 05/09/2017   Between 7am to 6pm - Pager - [phone]  After 6pm go to [email or web address] - password [email or web address] [facility]  Office  [phone]  CC: Primary care physician; Cletis Athens, MD  Note: This dictation was prepared with Dragon dictation along with smaller phrase technology. Any transcriptional errors that result from this process are unintentional.

## 2017-05-09 NOTE — Progress Notes (Signed)
Pt has been restless throughout the night. Pt wanted to be removed from the CPAP and go back to HFNC. Increased FIO2 75%, SPO2 91% at this time. RN aware. Will continue to monitor.

## 2017-05-09 NOTE — Progress Notes (Signed)
[place], [place] 05/09/17  Subjective:   Patient known to our practice from outpatient. He is followed by Dr Juleen China. Patient presented to ER with c/o diarrhea, vomiting for 2 days. He was prescribed Flagyl and Cipro as outpatient. He was noted to be hypoxic in ER and was placed on supplemental Oxygen. He underwent CT of chest, abdomen and pelvis with IV Contrast. No PE was noted. 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 Serum creatinine slightly improved to 1.99 today. Wife at bedside.  Explained possibility of renal injury by IV contrast exposure  Objective:  Vital signs in last 24 hours:  Temp:  [98.4 F (36.9 C)-99.3 F (37.4 C)] 99 F (37.2 C) (05/31 1200) Pulse Rate:  [68-87] 74 (05/31 1300) Resp:  [16-31] 20 (05/31 1300) BP: (96-154)/(40-82) 115/65 (05/31 1300) SpO2:  [85 %-98 %] 92 % (05/31 1300) FiO2 (%):  [50 %-75 %] 62 % (05/31 1200)  Weight change:  Filed Weights   05/06/17 1857 05/07/17 0046 05/07/17 0615  Weight: 87.5 kg (193 lb) 86.6 kg (190 lb 14.4 oz) 86.2 kg (190 lb 0.6 oz)    Intake/Output:    Intake/Output Summary (Last 24 hours) at 05/09/17 1415 Last data filed at 05/09/17 1300  Gross per 24 hour  Intake          2285.03 ml  Output             1375 ml  Net           910.03 ml     Physical Exam: General: NAD, laying in bed  HEENT High flow nasal cannula   Neck supple  Pulm/lungs Minimal basilar crackles   CVS/Heart Regular, no rub  Abdomen:  Soft, Non tender  Extremities: No peripheral edema  Neurologic: Alert, oriented  Skin: No rashes          Basic Metabolic Panel:   Recent Labs Lab 05/06/17 1901 05/07/17 0356 05/07/17 0637 05/08/17 0422 05/09/17 0549  NA 135  --  136 141 139  K 3.5  --  4.3 3.5 3.4*  CL 101  --  103 105 105  CO2 22  --  20*  25 26  GLUCOSE 120*  --  238* 184* 170*  BUN 37*  --  44* 49* 45*  CREATININE 1.99*  --  2.32* 2.33* 1.99*  CALCIUM 8.4*  --  7.9* 8.0* 7.9*  MG  --  1.7  --  1.9 1.9     CBC:  Recent Labs Lab 05/06/17 1901 05/07/17 0637 05/08/17 0422 05/09/17 0549  WBC 11.7* 15.1* 9.3 8.5  NEUTROABS 10.4*  --   --   --   HGB 12.2* 11.6* 10.4* 9.9*  HCT 36.7* 35.0* 30.8* 29.0*  MCV 89.9 88.0 87.7 88.7  PLT 203 208 184 208     No results found for: HEPBSAG, HEPBSAB, HEPBIGM    Microbiology:  Recent Results (from the past 240 hour(s))  Blood Culture (routine x 2)     Status: None (Preliminary result)   Collection Time: 05/06/17  7:02 PM  Result Value Ref Range Status   Specimen Description BLOOD RIGHT HAND  Final   Special Requests   Final    BOTTLES DRAWN AEROBIC AND ANAEROBIC Blood Culture adequate volume   Culture NO GROWTH 3 DAYS  Final   Report Status PENDING  Incomplete  Blood Culture (routine x 2)     Status: None (Preliminary result)   Collection Time: 05/06/17  7:02 PM  Result Value Ref Range Status   Specimen Description BLOOD LEFT AC  Final   Special Requests   Final    BOTTLES DRAWN AEROBIC AND ANAEROBIC Blood Culture adequate volume   Culture NO GROWTH 3 DAYS  Final   Report Status PENDING  Incomplete  Urine culture     Status: None   Collection Time: 05/06/17  7:18 PM  Result Value Ref Range Status   Specimen Description URINE, RANDOM  Final   Special Requests NONE  Final   Culture   Final    NO GROWTH Performed at [facility], [place] [address], [place], [place] [address]    Report Status 05/08/2017 FINAL  Final  Gastrointestinal Panel by PCR , Stool     Status: Abnormal   Collection Time: 05/07/17  4:44 AM  Result Value Ref Range Status   Campylobacter species NOT DETECTED NOT DETECTED Final   Plesimonas shigelloides NOT DETECTED NOT DETECTED Final   Salmonella species DETECTED (A) NOT DETECTED Final    Comment: RESULT CALLED TO, READ BACK BY AND  VERIFIED WITH: BETH BUONO 05/07/17 0642 ALV    Yersinia enterocolitica NOT DETECTED NOT DETECTED Final   Vibrio species NOT DETECTED NOT DETECTED Final   Vibrio cholerae NOT DETECTED NOT DETECTED Final   Enteroaggregative E coli (EAEC) NOT DETECTED NOT DETECTED Final   Enteropathogenic E coli (EPEC) NOT DETECTED NOT DETECTED Final   Enterotoxigenic E coli (ETEC) NOT DETECTED NOT DETECTED Final   Shiga like toxin producing E coli (STEC) NOT DETECTED NOT DETECTED Final   E. coli O157 NOT DETECTED NOT DETECTED Final   Shigella/Enteroinvasive E coli (EIEC) NOT DETECTED NOT DETECTED Final   Cryptosporidium NOT DETECTED NOT DETECTED Final   Cyclospora cayetanensis NOT DETECTED NOT DETECTED Final   Entamoeba histolytica NOT DETECTED NOT DETECTED Final   Giardia lamblia NOT DETECTED NOT DETECTED Final   Adenovirus F40/41 NOT DETECTED NOT DETECTED Final   Astrovirus NOT DETECTED NOT DETECTED Final   Norovirus GI/GII NOT DETECTED NOT DETECTED Final   Rotavirus A NOT DETECTED NOT DETECTED Final   Sapovirus (I, II, IV, and V) NOT DETECTED NOT DETECTED Final  C difficile quick scan w PCR reflex     Status: None   Collection Time: 05/07/17  4:44 AM  Result Value Ref Range Status   C Diff antigen NEGATIVE NEGATIVE Final   C Diff toxin NEGATIVE NEGATIVE Final   C Diff interpretation No C. difficile detected.  Final  MRSA PCR Screening     Status: None   Collection Time: 05/07/17  6:24 AM  Result Value Ref Range Status   MRSA by PCR NEGATIVE NEGATIVE Final    Comment:        The GeneXpert MRSA Assay (FDA approved for NASAL specimens only), is one component of a comprehensive MRSA colonization surveillance program. It is not intended to diagnose MRSA infection nor to guide or monitor treatment for MRSA infections.     Coagulation Studies:  Recent Labs  05/07/17 0637  LABPROT 14.2  INR 1.10    Urinalysis:  Recent Labs  05/06/17 1918  COLORURINE YELLOW*  LABSPEC 1.012  PHURINE  5.0  GLUCOSEU NEGATIVE  HGBUR MODERATE*  BILIRUBINUR NEGATIVE  KETONESUR 5*  PROTEINUR 100*  NITRITE NEGATIVE  LEUKOCYTESUR NEGATIVE      Imaging: Dg Chest 1 View  Result Date: 05/08/2017 CLINICAL DATA:  Dyspnea EXAM: CHEST 1 VIEW COMPARISON:  Chest x-ray of May 07, 2017 FINDINGS: The lungs are adequately inflated. The pulmonary interstitial markings have improved significantly. The pulmonary vascularity is less engorged. The cardiac silhouette remains mildly enlarged. There is no pleural effusion or alveolar infiltrate. IMPRESSION: Improving CHF with decreased interstitial edema. Electronically Signed   By: David  Martinique M.D.   On: 05/08/2017 07:10     Medications:   . sodium chloride 125 mL/hr at 05/09/17 1300  . sodium chloride    . [MAR Hold] ciprofloxacin    . heparin 1,200 Units/hr (05/09/17 1300)  . [MAR Hold] nitroGLYCERIN 5 mcg/min (05/09/17 1300)   . [MAR Hold] allopurinol  200 mg Oral BID  . [MAR Hold] amLODipine  5 mg Oral BID  . [MAR Hold] chlorhexidine  15 mL Mouth Rinse BID  . [MAR Hold] citalopram  10 mg Oral Daily  . [MAR Hold] clopidogrel  75 mg Oral Daily  . [MAR Hold] insulin aspart  0-15 Units Subcutaneous Q4H  . [MAR Hold] insulin glargine  10 Units Subcutaneous QHS  . [MAR Hold] ipratropium-albuterol  3 mL Nebulization Q4H  . [MAR Hold] loratadine  10 mg Oral Daily  . [MAR Hold] mouth rinse  15 mL Mouth Rinse q12n4p  . [MAR Hold] metoprolol tartrate  50 mg Oral QID  . [MAR Hold] mometasone-formoterol  2 puff Inhalation BID  . [MAR Hold] pantoprazole (PROTONIX) IV  40 mg Intravenous Q12H  . [MAR Hold] rosuvastatin  40 mg Oral Daily  . [MAR Hold] sucralfate  1 g Oral TID WC & HS  . [MAR Hold] Vitamin D (Ergocalciferol)  50,000 Units Oral Q30 days   [MAR Hold] acetaminophen, [MAR Hold] chlorpheniramine-HYDROcodone, [MAR Hold] guaiFENesin **AND** [DISCONTINUED] dextromethorphan, [MAR Hold]  morphine injection, [MAR Hold] nitroGLYCERIN, [MAR Hold]  ondansetron (ZOFRAN) IV  Assessment/ Plan:  76 y.o. 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 Stable today - Currently getting fuorsemide for Pulm edema - Use as Necessary - Will need iv fluids to prevent contrast nephropathy if cardiac cath is planned. May decrease IV normal saline to 50 cc per hour at 7 PM tonight.  Discussed with patient's nurse. - Electrolytes and Volume status are acceptable No acute indication for Dialysis at present   2. Acute resp failure - Pulm edema by CT but minimal crackles on Exam - probably related to NTEMI -ANCA panel and Anti GBM Ab- pending  3. DM-2 with CKD - Baseline Cr 1.7/GFR 38 as outpatient - HbA1c 8.2 % (05/02/17)  4. Acute gastroenteritis - Salmonella detected - C diff negative  5. Acute NSTEMI - Mgmt as per ICU team/Cardiology    LOS: 3 Mehr Depaoli 5/31/20182:15 PM  [facility] [place], [place]

## 2017-05-09 NOTE — Progress Notes (Signed)
Per dr Clayborn Bigness, DC heparin and nitroglycerine and start lovenox

## 2017-05-09 NOTE — Progress Notes (Signed)
ANTICOAGULATION CONSULT NOTE - Follow Up Consult  Pharmacy Consult for Enoxaparin Indication: DVT prophylaxis  Allergies  Allergen Reactions  . Peanut-Containing Drug Products Anaphylaxis  . Penicillins Hives and Rash    Has patient had a PCN reaction causing immediate rash, facial/tongue/throat swelling, SOB or lightheadedness with hypotension: Yes Has patient had a PCN reaction causing severe rash involving mucus membranes or skin necrosis: No Has patient had a PCN reaction that required hospitalization No Has patient had a PCN reaction occurring within the last 10 years: No If all of the above answers are "NO", then may proceed with Cephalosporin use.  . Bee Venom Swelling  . Influenza Vaccines Hives  . Inh [Isoniazid] Hives  . Kenalog [Triamcinolone Acetonide] Hives  . Nalfon [Fenoprofen Calcium] Hives    Patient Measurements: Height: 5\' 7"  (170.2 cm) Weight: 190 lb 0.6 oz (86.2 kg) IBW/kg (Calculated) : 66.1 Heparin Dosing Weight:   Vital Signs: Temp: 99.8 F (37.7 C) (05/31 1800) Temp Source: Axillary (05/31 1800) BP: 129/55 (05/31 1800) Pulse Rate: 78 (05/31 1800)  Labs:  Recent Labs  05/07/17 0356  05/07/17 0637 05/07/17 0950 05/07/17 1415 05/07/17 1640 05/08/17 0422 05/09/17 0549  HGB  --   --  11.6*  --   --   --  10.4* 9.9*  HCT  --   --  35.0*  --   --   --  30.8* 29.0*  PLT  --   --  208  --   --   --  184 208  LABPROT  --   --  14.2  --   --   --   --   --   INR  --   --  1.10  --   --   --   --   --   HEPARINUNFRC  --   < > 0.30  --  0.36  --  0.43 0.25*  CREATININE  --   --  2.32*  --   --   --  2.33* 1.99*  TROPONINI 15.97*  --   --  21.51*  --  23.72*  --   --   < > = values in this interval not displayed.  Estimated Creatinine Clearance: 32.6 mL/min (A) (by C-G formula based on SCr of 1.99 mg/dL (H)).   Medications:  Prescriptions Prior to Admission  Medication Sig Dispense Refill Last Dose  . acetaminophen (TYLENOL) 500 MG tablet Take  1,000 mg by mouth every 6 (six) hours as needed for mild pain, moderate pain or headache.   prn at prn  . allopurinol (ZYLOPRIM) 100 MG tablet Take 200 mg by mouth 2 (two) times daily.    05/06/2017 at am  . amLODipine (NORVASC) 5 MG tablet TAKE 1 TABLET (5 MG TOTAL) BY MOUTH 2 (TWO) TIMES DAILY.  5 05/06/2017 at am  . aspirin (ASPIRIN CHILDRENS) 81 MG chewable tablet Chew 1 tablet (81 mg total) by mouth daily. 120 tablet 0 05/06/2017 at Unknown time  . budesonide-formoterol (SYMBICORT) 160-4.5 MCG/ACT inhaler Inhale 2 puffs into the lungs 2 (two) times daily.   05/06/2017 at am  . cetirizine (ZYRTEC) 5 MG tablet Take 5 mg by mouth daily as needed for allergies or rhinitis.    prn at prn  . Cholecalciferol (VITAMIN D3) 50000 UNITS CAPS Take 50,000 Units by mouth every 30 (thirty) days.   04/30/2017  . ciprofloxacin (CIPRO) 500 MG tablet Take 500 mg by mouth 2 (two) times daily.   05/06/2017 at 1530  .  citalopram (CELEXA) 10 MG tablet Take 10 mg by mouth daily.   05/06/2017 at am  . cloNIDine (CATAPRES) 0.1 MG tablet Take 1 tablet (0.1 mg total) by mouth 2 (two) times daily. (Patient taking differently: Take 0.1 mg by mouth at bedtime. ) 60 tablet 11 05/05/2017 at qhs   . clopidogrel (PLAVIX) 75 MG tablet Take 1 tablet (75 mg total) by mouth daily. 30 tablet 0 05/06/2017 at am  . colchicine 0.6 MG tablet Take 0.6 mg by mouth 3 (three) times daily.   prn at prn  . dextromethorphan-guaiFENesin (MUCINEX DM) 30-600 MG 12hr tablet Take 1 tablet by mouth 2 (two) times daily as needed for cough.   prn at prn  . EPINEPHrine (EPI-PEN) 0.3 mg/0.3 mL SOAJ injection Inject into the muscle as needed (anaphylaxis).    prn at prn  . furosemide (LASIX) 20 MG tablet Take 20 mg by mouth 2 (two) times daily.    05/06/2017 at am  . insulin aspart (NOVOLOG FLEXPEN) 100 UNIT/ML FlexPen Inject 14-20 Units into the skin See admin instructions. Inject 14 units every morning with breakfast and follow sliding scale for the rest of meals.    05/06/2017 at am  . insulin glargine (LANTUS) 100 UNIT/ML injection Inject 55 Units into the skin at bedtime.    05/05/2017 at qhs  . isosorbide mononitrate (IMDUR) 120 MG 24 hr tablet Take 1 tablet (120 mg total) by mouth daily. (Patient taking differently: Take 60 mg by mouth daily. ) 30 tablet 0 05/06/2017 at am  . lisinopril (PRINIVIL,ZESTRIL) 10 MG tablet Take 10 mg by mouth daily.   05/06/2017 at am  . metoprolol (LOPRESSOR) 50 MG tablet Take 25 mg by mouth 2 (two) times daily.    05/06/2017 at am  . metroNIDAZOLE (FLAGYL) 250 MG tablet Take 250 mg by mouth 3 (three) times daily.   05/06/2017 at 1200  . nitroGLYCERIN (NITROSTAT) 0.4 MG SL tablet Place 0.4 mg under the tongue every 5 (five) minutes x 3 doses as needed for chest pain.    prn at prn  . pantoprazole (PROTONIX) 40 MG tablet Take 40 mg by mouth 2 (two) times daily.   05/06/2017 at am  . polyethylene glycol powder (GLYCOLAX/MIRALAX) powder Use as directed   prn at prn  . rosuvastatin (CRESTOR) 40 MG tablet Take 40 mg by mouth daily.   05/05/2017 at pm  . CARAFATE 1 GM/10ML suspension TAKE 2 TEASPOONSFUL (10MLS) BY MOUTH 4 TIMES A DAY BEFORE MEALS AND NIGHTLY FOR 14 DAYS  2 Not Taking at Unknown time   Scheduled:  . allopurinol  200 mg Oral BID  . amLODipine  5 mg Oral BID  . chlorhexidine  15 mL Mouth Rinse BID  . citalopram  10 mg Oral Daily  . clopidogrel  75 mg Oral Daily  . enoxaparin (LOVENOX) injection  40 mg Subcutaneous Q24H  . insulin aspart  0-15 Units Subcutaneous Q4H  . insulin glargine  10 Units Subcutaneous QHS  . ipratropium-albuterol  3 mL Nebulization Q4H  . loratadine  10 mg Oral Daily  . mouth rinse  15 mL Mouth Rinse q12n4p  . metoprolol tartrate  50 mg Oral QID  . mometasone-formoterol  2 puff Inhalation BID  . pantoprazole (PROTONIX) IV  40 mg Intravenous Q12H  . rosuvastatin  40 mg Oral Daily  . sucralfate  1 g Oral TID WC & HS  . Vitamin D (Ergocalciferol)  50,000 Units Oral Q30 days     Assessment:  Patient previously on heparin gtt for ACS. Cardiology stopped heparin gtt and wants to start Enoxaparin for DVT prophylaxis.  Goal of Therapy:      Plan:  Will start Enoxaparin 40 mg SQ daily.   Damonique Brunelle D 05/09/2017,6:25 PM

## 2017-05-09 NOTE — Progress Notes (Signed)
   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  Transferred from telemetry unit to ICU 05/29 with worsening acute respiratory failure requiring continuous Bipap and had a brief period of afib with RVR   SIGNIFICANT EVENTS  05/28-Pt admitted to the telemetry unit  05/29-Pt transferred to ICU  05/30- Increased CP with Afib/RVR, ST depressions  STUDIES:  CT Abd Pelvis and CT Angio Chest 05/28>>No definite evidence pulmonary embolus. Aortic atherosclerosis. Coronary calcifications are noted suggesting coronary artery disease. Bilateral pulmonary edema is noted with minimal pleural effusions. Moderate size fat containing right inguinal hernia. 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 Dilated gallbladder is noted without evidence of inflammation. No cholelithiasis is noted.   SUBJECTIVE: No complaints at this time   VITAL SIGNS: Temp:  [97.5 F (36.4 C)-99.3 F (37.4 C)] 98.8 F (37.1 C) (05/31 0800) Pulse Rate:  [68-87] 69 (05/31 1000) Resp:  [12-31] 31 (05/31 1000) BP: (96-154)/(40-82) 114/51 (05/31 1000) SpO2:  [85 %-98 %] 87 % (05/31 1000) FiO2 (%):  [35 %-75 %] 62 % (05/31 1111)  PHYSICAL EXAMINATION: General: well developed, well nourished Caucasian male, NAD Neuro: alert and oriented, follows commands HEENT: supple, no JVD Cardiovascular: nsr, s1s2, no M/R/G Lungs: diminished throughout, even, non labored  Abdomen: +BS x4, soft, obese, non distended, non tender Musculoskeletal: normal bulk and tone, no edema  Skin: intact no rashes or lesions    Recent Labs Lab 05/07/17 0637 05/08/17 0422 05/09/17 0549  NA 136 141 139  K 4.3 3.5 3.4*  CL 103 105 105  CO2 20* 25 26  BUN 44* 49* 45*    CREATININE 2.32* 2.33* 1.99*  GLUCOSE 238* 184* 170*    Recent Labs Lab 05/07/17 0637 05/08/17 0422 05/09/17 0549  HGB 11.6* 10.4* 9.9*  HCT 35.0* 30.8* 29.0*  WBC 15.1* 9.3 8.5  PLT 208 184 208   ASSESSMENT / PLAN: 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 and pulmonary edema Afib with RVR-resolved  NSTEMI Acute on chronic renal failure Salmonella Gastroenteritis  Hx: OSA (Bipap qhs), CKD Stage III, Diverticulosis, Barrett Esophagus, HTN, Hyperlipidemia, HTN, and MI  P: HFNC for now to maintain O2 sats 88% to 92% or for dyspnea wean as tolerated  CPAP qhs  Scheduled bronchodilator therapy Continue norvasc, plavix, metoprolol, and rosuvastatin  Prn iv morphine for pain management  Cardiology consulted appreciate input Plans for Cardiac Catheterization today 05/31 Troponin post cardiac cath  Continue heparin gtt dosing per pharmacy Trend CBC's Trend BMP's Replace electrolytes as indicated Monitor uop Nephrology consulted appreciate input  Monitor for s/sx of bleeding Transfuse for hgb <7 Continue Cipro-stop date 06/4 Protonix for PUD prophylaxis  Follow cultures  SSI and lantus qhs   Marda Stalker, [place] Pager [phone] (please enter 7 digits) PCCM Consult Pager [phone] (please enter 7 digits)  Patient seen and examined with NP, agree with findings, assessment, plan. Patient is admitted with the acute and NSTEMI with pulmonary edema, acute kidney injury. Continue high flow nasal cannula, wean down as tolerated. We'll consider diuresis. Once kidney function has improved. Discussed with nephrology.  Marda Stalker, M.D.  05/10/2017

## 2017-05-09 NOTE — Progress Notes (Signed)
ANTICOAGULATION CONSULT NOTE - Initial Consult  Pharmacy Consult for Heparin  Indication: chest pain/ACS  Allergies  Allergen Reactions  . Peanut-Containing Drug Products Anaphylaxis  . Penicillins Hives and Rash    Has patient had a PCN reaction causing immediate rash, facial/tongue/throat swelling, SOB or lightheadedness with hypotension: Yes Has patient had a PCN reaction causing severe rash involving mucus membranes or skin necrosis: No Has patient had a PCN reaction that required hospitalization No Has patient had a PCN reaction occurring within the last 10 years: No If all of the above answers are "NO", then may proceed with Cephalosporin use.  . Bee Venom Swelling  . Influenza Vaccines Hives  . Inh [Isoniazid] Hives  . Kenalog [Triamcinolone Acetonide] Hives  . Nalfon [Fenoprofen Calcium] Hives    Patient Measurements: Height: 5\' 7"  (170.2 cm) Weight: 190 lb 0.6 oz (86.2 kg) IBW/kg (Calculated) : 66.1 Heparin Dosing Weight: 84.1 kg   Vital Signs: Temp: 98.8 F (37.1 C) (05/31 0400) Temp Source: Oral (05/31 0400) BP: 109/48 (05/31 0400) Pulse Rate: 68 (05/31 0400)  Labs:  Recent Labs  05/07/17 0356  05/07/17 0637 05/07/17 0950 05/07/17 1415 05/07/17 1640 05/08/17 0422 05/09/17 0549  HGB  --   --  11.6*  --   --   --  10.4* 9.9*  HCT  --   --  35.0*  --   --   --  30.8* 29.0*  PLT  --   --  208  --   --   --  184 208  LABPROT  --   --  14.2  --   --   --   --   --   INR  --   --  1.10  --   --   --   --   --   HEPARINUNFRC  --   < > 0.30  --  0.36  --  0.43 0.25*  CREATININE  --   --  2.32*  --   --   --  2.33* 1.99*  TROPONINI 15.97*  --   --  21.51*  --  23.72*  --   --   < > = values in this interval not displayed.  Estimated Creatinine Clearance: 32.6 mL/min (A) (by C-G formula based on SCr of 1.99 mg/dL (H)).   Medical History: Past Medical History:  Diagnosis Date  . Arthritis   . Atrioventricular canal (AVC)    irregular heart beats  .  Barrett esophagus   . Cancer [employer]) 2002   prostate  . Colon polyp   . Diabetes mellitus without complication ([place])   . Diverticulosis   . Gout   . Heart disease   . Hemangioma    liver  . Hyperlipidemia   . Hypertension   . Myocardial infarct ([place])   . Ocular hypertension   . Peripheral vascular disease ([place])   . Skin cancer   . Sleep apnea   . Vitreoretinal degeneration     Medications:  Scheduled:  . allopurinol  200 mg Oral BID  . amLODipine  5 mg Oral BID  . citalopram  10 mg Oral Daily  . clopidogrel  75 mg Oral Daily  . heparin  1,300 Units Intravenous Once  . insulin aspart  0-15 Units Subcutaneous Q4H  . insulin glargine  10 Units Subcutaneous QHS  . ipratropium-albuterol  3 mL Nebulization Q4H  . loratadine  10 mg Oral Daily  . metoprolol tartrate  50 mg Oral QID  .  mometasone-formoterol  2 puff Inhalation BID  . pantoprazole (PROTONIX) IV  40 mg Intravenous Q12H  . rosuvastatin  40 mg Oral Daily  . sucralfate  1 g Oral TID WC & HS  . Vitamin D (Ergocalciferol)  50,000 Units Oral Q30 days    Assessment: Pharmacy consulted to dose heparin in this 76 year old male admitted with AECOPD and NSTEMI.   Goal of Therapy:  Heparin level 0.3-0.7 units/ml Monitor platelets by anticoagulation protocol: Yes   Plan:  Will continue heparin drip at 1100 units/hr and recheck HL and CBC in AM.   5/30 AM heparin level 0.43. Continue current regimen. Recheck heparin level and CBC with tomorrow AM labs.  5/31 AM heparin level 0.25. 1300 unit bolus and increase rate to 1200 units/hr. Recheck in 8 hours.  Mike Decker 05/09/2017,6:42 AM

## 2017-05-10 ENCOUNTER — Encounter: Payer: Self-pay | Admitting: Internal Medicine

## 2017-05-10 ENCOUNTER — Inpatient Hospital Stay: Payer: Medicare Other

## 2017-05-10 LAB — BPAM RBC
BLOOD PRODUCT EXPIRATION DATE: 201806052359
Blood Product Expiration Date: 201806072359
ISSUE DATE / TIME: 201806011005
Unit Type and Rh: 5100
Unit Type and Rh: 9500

## 2017-05-10 LAB — TYPE AND SCREEN
ABO/RH(D): A POS
Antibody Screen: POSITIVE
UNIT DIVISION: 0
UNIT DIVISION: 0

## 2017-05-10 LAB — BASIC METABOLIC PANEL
Anion gap: 9 (ref 5–15)
BUN: 35 mg/dL — AB (ref 6–20)
CO2: 22 mmol/L (ref 22–32)
CREATININE: 1.68 mg/dL — AB (ref 0.61–1.24)
Calcium: 8.2 mg/dL — ABNORMAL LOW (ref 8.9–10.3)
Chloride: 108 mmol/L (ref 101–111)
GFR calc Af Amer: 44 mL/min — ABNORMAL LOW (ref >60)
GFR, EST NON AFRICAN AMERICAN: 38 mL/min — AB (ref >60)
Glucose, Bld: 217 mg/dL — ABNORMAL HIGH (ref 65–99)
POTASSIUM: 3.8 mmol/L (ref 3.5–5.1)
Sodium: 139 mmol/L (ref 135–145)

## 2017-05-10 LAB — BRAIN NATRIURETIC PEPTIDE: B Natriuretic Peptide: 571 pg/mL — ABNORMAL HIGH (ref 0.0–100.0)

## 2017-05-10 LAB — GLUCOSE, CAPILLARY
GLUCOSE-CAPILLARY: 224 mg/dL — AB (ref 65–99)
Glucose-Capillary: 160 mg/dL — ABNORMAL HIGH (ref 65–99)
Glucose-Capillary: 164 mg/dL — ABNORMAL HIGH (ref 65–99)
Glucose-Capillary: 189 mg/dL — ABNORMAL HIGH (ref 65–99)
Glucose-Capillary: 209 mg/dL — ABNORMAL HIGH (ref 65–99)
Glucose-Capillary: 185 mg/dL — ABNORMAL HIGH (ref 65–99)

## 2017-05-10 MED ORDER — BUDESONIDE 0.5 MG/2ML IN SUSP
0.5000 mg | Freq: Two times a day (BID) | RESPIRATORY_TRACT | Status: DC
  Administered 2017-05-10 – 2017-05-15 (×11): 0.5 mg via RESPIRATORY_TRACT
  Filled 2017-05-10 (×11): qty 2

## 2017-05-10 MED ORDER — FUROSEMIDE 10 MG/ML IJ SOLN
INTRAMUSCULAR | Status: AC
  Filled 2017-05-10: qty 2

## 2017-05-10 MED ORDER — PANTOPRAZOLE SODIUM 40 MG PO TBEC
40.0000 mg | DELAYED_RELEASE_TABLET | Freq: Two times a day (BID) | ORAL | Status: DC
  Administered 2017-05-10 – 2017-05-15 (×11): 40 mg via ORAL
  Filled 2017-05-10 (×11): qty 1

## 2017-05-10 MED ORDER — CIPROFLOXACIN HCL 500 MG PO TABS
500.0000 mg | ORAL_TABLET | Freq: Two times a day (BID) | ORAL | Status: DC
  Administered 2017-05-10 – 2017-05-12 (×6): 500 mg via ORAL
  Filled 2017-05-10 (×7): qty 1

## 2017-05-10 MED ORDER — SODIUM CHLORIDE 0.9% FLUSH
3.0000 mL | Freq: Two times a day (BID) | INTRAVENOUS | Status: DC
  Administered 2017-05-10 – 2017-05-15 (×11): 3 mL via INTRAVENOUS

## 2017-05-10 MED ORDER — FUROSEMIDE 10 MG/ML IJ SOLN
20.0000 mg | Freq: Three times a day (TID) | INTRAMUSCULAR | Status: DC
  Administered 2017-05-10 – 2017-05-11 (×4): 20 mg via INTRAVENOUS
  Filled 2017-05-10 (×4): qty 2

## 2017-05-10 MED ORDER — METHYLPREDNISOLONE SODIUM SUCC 40 MG IJ SOLR
40.0000 mg | Freq: Two times a day (BID) | INTRAMUSCULAR | Status: DC
  Administered 2017-05-10 – 2017-05-13 (×6): 40 mg via INTRAVENOUS
  Filled 2017-05-10 (×5): qty 1

## 2017-05-10 MED ORDER — METHYLPREDNISOLONE SODIUM SUCC 40 MG IJ SOLR
INTRAMUSCULAR | Status: AC
  Filled 2017-05-10: qty 1

## 2017-05-10 NOTE — Progress Notes (Signed)
Writer heard patient calling out my name from his room; entered room to find him very SOB and with increased WOB on hi flo, tachycardic in one teens,  RR in 30s, O2 sats in low 80s, writer placed biPap (in CPAP mode) back on, O2 sats increased to 89% but no further, pt anxious with NP cough, called RT to turn BiPap to BiPap mode.

## 2017-05-10 NOTE — Progress Notes (Addendum)
PHARMACIST - PHYSICIAN COMMUNICATION DR:   Ashby Dawes CONCERNING: Antibiotic/Non-antibiotic IV to Oral Route Change Policy  RECOMMENDATION: This patient is receiving ciprofloxacin by the intravenous route.  Based on criteria approved by the Pharmacy and Therapeutics Committee, the antibiotic(s) is/are being converted to the equivalent oral dose form(s).   DESCRIPTION: These criteria include:  Patient being treated for a respiratory tract infection, urinary tract infection, cellulitis or clostridium difficile associated diarrhea if on metronidazole  The patient is not neutropenic and does not exhibit a GI malabsorption state  The patient is eating (either orally or via tube) and/or has been taking other orally administered medications for a least 24 hours  The patient is improving clinically and has a Tmax < 100.5   RECOMMENDATION: This patient is receiving pantoprazole by the intravenous route.  Based on criteria approved by the Pharmacy and Therapeutics Committee, the intravenous medication(s) is/are being converted to the equivalent oral dose form(s).   DESCRIPTION: These criteria include:  The patient is eating (either orally or via tube) and/or has been taking other orally administered medications for a least 24 hours  The patient has no evidence of active gastrointestinal bleeding or impaired GI absorption (gastrectomy, short bowel, patient on TNA or NPO).  If you have questions about this conversion, please contact the Pharmacy Department  []   [phone] )  [facility] [x]   [phone] )  [facility] []   [phone] )  [facility] []   [phone] )  [facility] []   [phone] )  [place], [facility] 05/10/2017 9:21 AM

## 2017-05-10 NOTE — Progress Notes (Signed)
Pt UOP seems to be responding fairly well to lasix, but respiratory status is not improving so far.  His O2 drops into 80s and he becomes short of breath when off Bi Pap for just a few moments.  O2 sats with BiPap on are minimally acceptable and are often in 80s , NP Dana Ad[employer] copywriter is concerned patient may require mechanical ventilation if he does not show more improvement soon. Solu Medrol started

## 2017-05-10 NOTE — Progress Notes (Signed)
[facility] [place] NAME: Mike Decker    MR#:  [id number]  DATE OF BIRTH:  07/17/1941  SUBJECTIVE:  CHIEF COMPLAINT:   Chief Complaint  Patient presents with  . Diarrhea  . Emesis     Came with GI symptoms, SOB.   Have High troponin, on heparin drip.   Required bipap, now on HFNC.   Had A fib with RVR. Now converted to NSR.   S/p cath- showed double vessel CAD- suggested medical management.  REVIEW OF SYSTEMS:  CONSTITUTIONAL: No fever, fatigue or weakness.  EYES: No blurred or double vision.  EARS, NOSE, AND THROAT: No tinnitus or ear pain.  RESPIRATORY: No cough, positive for shortness of breath,no wheezing or hemoptysis.  CARDIOVASCULAR: No chest pain, orthopnea, edema.  GASTROINTESTINAL: No nausea, vomiting, diarrhea or abdominal pain.  GENITOURINARY: No dysuria, hematuria.  ENDOCRINE: No polyuria, nocturia,  HEMATOLOGY: No anemia, easy bruising or bleeding SKIN: No rash or lesion. MUSCULOSKELETAL: No joint pain or arthritis.   NEUROLOGIC: No tingling, numbness, weakness.  PSYCHIATRY: No anxiety or depression.   ROS  DRUG ALLERGIES:   Allergies  Allergen Reactions  . Peanut-Containing Drug Products Anaphylaxis  . Penicillins Hives and Rash    Has patient had a PCN reaction causing immediate rash, facial/tongue/throat swelling, SOB or lightheadedness with hypotension: Yes Has patient had a PCN reaction causing severe rash involving mucus membranes or skin necrosis: No Has patient had a PCN reaction that required hospitalization No Has patient had a PCN reaction occurring within the last 10 years: No If all of the above answers are "NO", then may proceed with Cephalosporin use.  . Bee Venom Swelling  . Influenza Vaccines Hives  . Inh [Isoniazid] Hives  . Kenalog [Triamcinolone Acetonide] Hives  . Nalfon [Fenoprofen Calcium] Hives    VITALS:  Blood pressure (!) 106/59, pulse 71, temperature 99 F (37.2 C),  temperature source Axillary, resp. rate (!) 26, height 5\' 7"  (1.702 m), weight 86.2 kg (190 lb 0.6 oz), SpO2 91 %.  PHYSICAL EXAMINATION:  GENERAL:  76 y.o.-year-old patient lying in the bed with no acute distress.  EYES: Pupils equal, round, reactive to light and accommodation. No scleral icterus. Extraocular muscles intact.  HEENT: Head atraumatic, normocephalic. Oropharynx and nasopharynx clear.  NECK:  Supple, no jugular venous distention. No thyroid enlargement, no tenderness.  LUNGS: Normal breath sounds bilaterally, no wheezing, bilateral crepitation. No use of accessory muscles of respiration. Using HFNC today. CARDIOVASCULAR: S1, S2 normal. No murmurs, rubs, or gallops.  ABDOMEN: Soft, nontender, nondistended. Bowel sounds present. No organomegaly or mass.  EXTREMITIES: No pedal edema, cyanosis, or clubbing.  NEUROLOGIC: Cranial nerves II through XII are intact. Muscle strength 5/5 in all extremities. Sensation intact. Gait not checked.  PSYCHIATRIC: The patient is alert and oriented x 3.  SKIN: No obvious rash, lesion, or ulcer.   Physical Exam LABORATORY PANEL:   CBC  Recent Labs Lab 05/09/17 0549  WBC 8.5  HGB 9.9*  HCT 29.0*  PLT 208   ------------------------------------------------------------------------------------------------------------------  Chemistries   Recent Labs Lab 05/06/17 1901  05/09/17 0549 05/10/17 1037  NA 135  < > 139 139  K 3.5  < > 3.4* 3.8  CL 101  < > 105 108  CO2 22  < > 26 22  GLUCOSE 120*  < > 170* 217*  BUN 37*  < > 45* 35*  CREATININE 1.99*  < > 1.99* 1.68*  CALCIUM 8.4*  < >  7.9* 8.2*  MG  --   < > 1.9  --   AST 24  --   --   --   ALT 13*  --   --   --   ALKPHOS 69  --   --   --   BILITOT 0.7  --   --   --   < > = values in this interval not displayed. ------------------------------------------------------------------------------------------------------------------  Cardiac Enzymes  Recent Labs Lab 05/07/17 0950  05/07/17 1640  TROPONINI 21.51* 23.72*   ------------------------------------------------------------------------------------------------------------------  RADIOLOGY:  Dg Chest Port 1 View  Result Date: 05/10/2017 CLINICAL DATA:  Shortness of Breath EXAM: PORTABLE CHEST 1 VIEW COMPARISON:  May 08, 2017 FINDINGS: There is moderate interstitial pulmonary edema, increased from recent study. There it are minimal pleural effusions bilaterally. There is cardiomegaly with pulmonary venous hypertension. No airspace consolidation. No adenopathy. No bone lesions. IMPRESSION: Congestive heart failure with increased edema compared to recent study. Stable cardiac prominence. Electronically Signed   By: Lowella Grip III M.D.   On: 05/10/2017 10:07    ASSESSMENT AND PLAN:   Active Problems:   NSTEMI (non-ST elevated myocardial infarction) ([place])  #. NSTEMI, history of coronary artery disease -  with telemetry monitoring, continuous pulse ox - Trend troponins, check lipids and TSH. - Checked echo- EF 55% - Heparin drip given -Continue Lopressor, nitroglycerin, Imdur, Plavix, Crestor - Cardiology consult appreciated. - Cath done 05/09/17- double vessel CAD, previous RCA stent is patent. Medical management.  #. Hypoxia- ac respi failure, Pulmonary edema with pleural effusion, secondary to ac diastolic CHF. Now also  have A fib with RVR. - Intake/output, daily weight. Lasix. - Mednebs, O2 therapy as needed - Cardio on case, lasix IV started again now after cath.  #. Abdominal pain, unclear etiology possibly viral gastroenteritis given diarrhea - Checked stool studies - salmonella infection- on cipro. - Antiemetics and antidiarrheals as needed  #. CKD3, Creatinine near baseline - Monitor BMP as patient had IV contrast - nephrologist on case. - stable after cath, now will be on lasix.  #. H/o Diabetes - Accuchecks q4h with RISS coverage - Continue Lantus at half dose - Hgb A1c-  7.7  #. History of COPD -Continue Symbicort, duoneb.   #. History of hypertension -Continue Norvasc, Lopressor, Catapres, will stop lisinopril due to worsening renal func.  #. History of Barrett's esophagus/GERD -Continue Carafate, Protonix  #. History of depression -Continue Celexa  #. History of gout -Continue allopurinol, colchicine  #. History of obstructive sleep apnea -Continue CPAP at night per home regimen   All the records are reviewed and case discussed with Care Management/Social Workerr. Management plans discussed with the patient, family and they are in agreement.  CODE STATUS: Full.  TOTAL TIME TAKING CARE OF THIS PATIENT: 40 minutes.   Pt's wife was present in room today.  POSSIBLE D/C IN 2-3 DAYS, DEPENDING ON CLINICAL CONDITION.   Vaughan Basta M.D on 05/10/2017   Between 7am to 6pm - Pager - [phone]  After 6pm go to [email or web address] - password [email or web address] [facility]  Office  [phone]  CC: Primary care physician; Cletis Athens, MD  Note: This dictation was prepared with Dragon dictation along with smaller phrase technology. Any transcriptional errors that result from this process are unintentional.

## 2017-05-10 NOTE — Progress Notes (Addendum)
   Name: Mike Decker MRN: [id number] DOB: 1940-12-23    ADMISSION DATE:  05/06/2017  BRIEF PATIENT DESCRIPTION:  76 yo male admitted 05/28 with acute on chronic hypoxic respiratory failure secondary to AECOPD and pulmonary edema, NSTEMI, and abdominal pain with diarrhea.  Transferred from telemetry unit to ICU 05/29 with worsening acute respiratory failure requiring continuous Bipap and had a brief period of afib with RVR   SIGNIFICANT EVENTS  05/28-Pt admitted to the telemetry unit  05/29-Pt transferred to ICU  05/30- Increased CP with Afib/RVR, ST depressions 05/30-cardiac cath; chronic disease with a stent. Recommend medical management.  STUDIES:  CT Abd Pelvis and CT Angio Chest 05/28>>No definite evidence pulmonary embolus. Aortic atherosclerosis. Coronary calcifications are noted suggesting coronary artery disease. Bilateral pulmonary edema is noted with minimal pleural effusions. Moderate size fat containing right inguinal hernia. 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 Dilated gallbladder is noted without evidence of inflammation. No cholelithiasis is noted.   SUBJECTIVE: No complaints at this time   VITAL SIGNS: Temp:  [98.5 F (36.9 C)-100.1 F (37.8 C)] 98.5 F (36.9 C) (06/01 0800) Pulse Rate:  [60-85] 85 (06/01 0900) Resp:  [20-31] 27 (06/01 0900) BP: (99-144)/(51-79) 144/56 (06/01 0900) SpO2:  [87 %-96 %] 91 % (06/01 0900) FiO2 (%):  [50 %-62 %] 60 % (06/01 0900)  PHYSICAL EXAMINATION: General: well developed, well nourished Caucasian male, NAD Neuro: alert and oriented, follows commands HEENT: supple, no JVD Cardiovascular: nsr, s1s2, no M/R/G Lungs: diminished throughout, even, non labored  Abdomen: +BS x4, soft, obese, non distended, non tender Musculoskeletal: normal bulk and tone, no edema  Skin: intact no rashes or lesions    Recent Labs Lab 05/07/17 0637 05/08/17 0422 05/09/17 0549  NA  136 141 139  K 4.3 3.5 3.4*  CL 103 105 105  CO2 20* 25 26  BUN 44* 49* 45*  CREATININE 2.32* 2.33* 1.99*  GLUCOSE 238* 184* 170*    Recent Labs Lab 05/07/17 0637 05/08/17 0422 05/09/17 0549  HGB 11.6* 10.4* 9.9*  HCT 35.0* 30.8* 29.0*  WBC 15.1* 9.3 8.5  PLT 208 184 208   ASSESSMENT / PLAN: Acute on chronic hypoxic respiratory failure secondary pulmonary edema Afib with RVR-resolved  NSTEMI, Status post 5/31; patent stent. Acute on chronic renal failure Salmonella Gastroenteritis  Hx: OSA (Bipap qhs), CKD Stage III, Diverticulosis, Barrett Esophagus, HTN, Hyperlipidemia, HTN, and MI  P: HFNC for now to maintain O2 sats 88% to 92% or for dyspnea wean as tolerated  CPAP qhs  We'll continue antibiotics to complete course for Salmonella enteritis. Continue heparin gtt dosing per pharmacy SSI and lantus qhs  I personally reviewed. Chest x-ray imaging, this is consistent with pulmonary edema. We'll start diuresis. Repeat chest x-ray in a.m.    Marda Stalker, M.D.  05/10/2017

## 2017-05-10 NOTE — Progress Notes (Signed)
[facility], [place] 05/10/17  Subjective:   Patient known to our practice from outpatient. He is followed by Dr Juleen China. Patient presented to ER with c/o diarrhea, vomiting for 2 days. He was prescribed Flagyl and Cipro as outpatient. He was noted to be hypoxic in ER and was placed on supplemental Oxygen. He underwent CT of chest, abdomen and pelvis with IV Contrast. No PE was noted. 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 Some CAD; medical management recommended No new labs today UOP 975 cc  Objective:  Vital signs in last 24 hours:  Temp:  [98.5 F (36.9 C)-100.1 F (37.8 C)] 98.5 F (36.9 C) (06/01 0800) Pulse Rate:  [60-87] 70 (06/01 0600) Resp:  [20-31] 26 (06/01 0600) BP: (99-136)/(48-79) 130/56 (06/01 0600) SpO2:  [87 %-96 %] 96 % (06/01 0600) FiO2 (%):  [50 %-62 %] 60 % (06/01 0835)  Weight change:  Filed Weights   05/06/17 1857 05/07/17 0046 05/07/17 0615  Weight: 87.5 kg (193 lb) 86.6 kg (190 lb 14.4 oz) 86.2 kg (190 lb 0.6 oz)    Intake/Output:    Intake/Output Summary (Last 24 hours) at 05/10/17 0900 Last data filed at 05/10/17 0800  Gross per 24 hour  Intake          2174.08 ml  Output              975 ml  Net          1199.08 ml     Physical Exam: General: NAD, sitting up in bed eating breakfast  HEENT High flow nasal cannula   Neck supple  Pulm/lungs Minimal basilar crackles   CVS/Heart Regular, no rub  Abdomen:  Soft, Non tender  Extremities: No peripheral edema  Neurologic: Alert, oriented  Skin: No rashes          Basic Metabolic Panel:   Recent Labs Lab 05/06/17 1901 05/07/17 0356 05/07/17 0637 05/08/17 0422 05/09/17 0549  NA 135  --  136 141 139  K 3.5  --  4.3 3.5 3.4*  CL 101  --  103 105 105  CO2 22  --  20* 25 26  GLUCOSE 120*  --  238*  184* 170*  BUN 37*  --  44* 49* 45*  CREATININE 1.99*  --  2.32* 2.33* 1.99*  CALCIUM 8.4*  --  7.9* 8.0* 7.9*  MG  --  1.7  --  1.9 1.9     CBC:  Recent Labs Lab 05/06/17 1901 05/07/17 0637 05/08/17 0422 05/09/17 0549  WBC 11.7* 15.1* 9.3 8.5  NEUTROABS 10.4*  --   --   --   HGB 12.2* 11.6* 10.4* 9.9*  HCT 36.7* 35.0* 30.8* 29.0*  MCV 89.9 88.0 87.7 88.7  PLT 203 208 184 208     No results found for: HEPBSAG, HEPBSAB, HEPBIGM    Microbiology:  Recent Results (from the past 240 hour(s))  Blood Culture (routine x 2)     Status: None (Preliminary result)   Collection Time: 05/06/17  7:02 PM  Result Value Ref Range Status   Specimen Description BLOOD RIGHT HAND  Final   Special Requests   Final    BOTTLES DRAWN AEROBIC AND ANAEROBIC Blood Culture adequate volume   Culture NO GROWTH 4 DAYS  Final   Report Status PENDING  Incomplete  Blood Culture (routine x  2)     Status: None (Preliminary result)   Collection Time: 05/06/17  7:02 PM  Result Value Ref Range Status   Specimen Description BLOOD LEFT AC  Final   Special Requests   Final    BOTTLES DRAWN AEROBIC AND ANAEROBIC Blood Culture adequate volume   Culture NO GROWTH 4 DAYS  Final   Report Status PENDING  Incomplete  Urine culture     Status: None   Collection Time: 05/06/17  7:18 PM  Result Value Ref Range Status   Specimen Description URINE, RANDOM  Final   Special Requests NONE  Final   Culture   Final    NO GROWTH Performed at [facility], [place] [address], [place], [place] [address]    Report Status 05/08/2017 FINAL  Final  Gastrointestinal Panel by PCR , Stool     Status: Abnormal   Collection Time: 05/07/17  4:44 AM  Result Value Ref Range Status   Campylobacter species NOT DETECTED NOT DETECTED Final   Plesimonas shigelloides NOT DETECTED NOT DETECTED Final   Salmonella species DETECTED (A) NOT DETECTED Final    Comment: RESULT CALLED TO, READ BACK BY AND VERIFIED WITH: BETH BUONO 05/07/17  0642 ALV    Yersinia enterocolitica NOT DETECTED NOT DETECTED Final   Vibrio species NOT DETECTED NOT DETECTED Final   Vibrio cholerae NOT DETECTED NOT DETECTED Final   Enteroaggregative E coli (EAEC) NOT DETECTED NOT DETECTED Final   Enteropathogenic E coli (EPEC) NOT DETECTED NOT DETECTED Final   Enterotoxigenic E coli (ETEC) NOT DETECTED NOT DETECTED Final   Shiga like toxin producing E coli (STEC) NOT DETECTED NOT DETECTED Final   E. coli O157 NOT DETECTED NOT DETECTED Final   Shigella/Enteroinvasive E coli (EIEC) NOT DETECTED NOT DETECTED Final   Cryptosporidium NOT DETECTED NOT DETECTED Final   Cyclospora cayetanensis NOT DETECTED NOT DETECTED Final   Entamoeba histolytica NOT DETECTED NOT DETECTED Final   Giardia lamblia NOT DETECTED NOT DETECTED Final   Adenovirus F40/41 NOT DETECTED NOT DETECTED Final   Astrovirus NOT DETECTED NOT DETECTED Final   Norovirus GI/GII NOT DETECTED NOT DETECTED Final   Rotavirus A NOT DETECTED NOT DETECTED Final   Sapovirus (I, II, IV, and V) NOT DETECTED NOT DETECTED Final  C difficile quick scan w PCR reflex     Status: None   Collection Time: 05/07/17  4:44 AM  Result Value Ref Range Status   C Diff antigen NEGATIVE NEGATIVE Final   C Diff toxin NEGATIVE NEGATIVE Final   C Diff interpretation No C. difficile detected.  Final  MRSA PCR Screening     Status: None   Collection Time: 05/07/17  6:24 AM  Result Value Ref Range Status   MRSA by PCR NEGATIVE NEGATIVE Final    Comment:        The GeneXpert MRSA Assay (FDA approved for NASAL specimens only), is one component of a comprehensive MRSA colonization surveillance program. It is not intended to diagnose MRSA infection nor to guide or monitor treatment for MRSA infections.     Coagulation Studies: No results for input(s): LABPROT, INR in the last 72 hours.  Urinalysis: No results for input(s): COLORURINE, LABSPEC, PHURINE, GLUCOSEU, HGBUR, BILIRUBINUR, KETONESUR, PROTEINUR,  UROBILINOGEN, NITRITE, LEUKOCYTESUR in the last 72 hours.  Invalid input(s): APPERANCEUR    Imaging: No results found.   Medications:   . sodium chloride 50 mL/hr at 05/10/17 0800  . ciprofloxacin Stopped (05/09/17 2208)   . allopurinol  200 mg Oral BID  . amLODipine  5 mg Oral BID  . chlorhexidine  15 mL Mouth Rinse BID  . citalopram  10 mg Oral Daily  . clopidogrel  75 mg Oral Daily  . enoxaparin (LOVENOX) injection  40 mg Subcutaneous Q24H  . insulin aspart  0-15 Units Subcutaneous Q4H  . insulin glargine  10 Units Subcutaneous QHS  . ipratropium-albuterol  3 mL Nebulization Q4H  . loratadine  10 mg Oral Daily  . mouth rinse  15 mL Mouth Rinse q12n4p  . metoprolol tartrate  50 mg Oral QID  . mometasone-formoterol  2 puff Inhalation BID  . pantoprazole (PROTONIX) IV  40 mg Intravenous Q12H  . rosuvastatin  40 mg Oral Daily  . sucralfate  1 g Oral TID WC & HS  . Vitamin D (Ergocalciferol)  50,000 Units Oral Q30 days   acetaminophen, acetaminophen, chlorpheniramine-HYDROcodone, guaiFENesin **AND** [DISCONTINUED] dextromethorphan, morphine injection, nitroGLYCERIN, ondansetron (ZOFRAN) IV  Assessment/ Plan:  76 y.o. 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 ARF on CKD st 3 -  S Cr at 1.99 yesterday - no new labs today - d/c maintenance IVF - use Lasix only for Pulm edema/resp distress - CXR  2. SOB - Pulm edema by CT but minimal crackles on Exam - probably related to NTEMI -ANCA panel and Anti GBM Ab neg  3. DM-2 with CKD - Baseline Cr 1.7/GFR 38 as outpatient - HbA1c 8.2 % (05/02/17)  4. Acute gastroenteritis - Salmonella detected - C diff negative  5. Acute NSTEMI - Mgmt as per ICU team/Cardiology. - coronary angiography 5/31- medical management recommended    LOS: [place] 6/1/20189:00 [place], [place]

## 2017-05-11 ENCOUNTER — Inpatient Hospital Stay: Payer: Medicare Other

## 2017-05-11 LAB — BASIC METABOLIC PANEL
Anion gap: 11 (ref 5–15)
BUN: 45 mg/dL — ABNORMAL HIGH (ref 6–20)
CHLORIDE: 107 mmol/L (ref 101–111)
CO2: 23 mmol/L (ref 22–32)
CREATININE: 1.82 mg/dL — AB (ref 0.61–1.24)
Calcium: 8.5 mg/dL — ABNORMAL LOW (ref 8.9–10.3)
GFR calc non Af Amer: 34 mL/min — ABNORMAL LOW (ref >60)
GFR, EST AFRICAN AMERICAN: 40 mL/min — AB (ref >60)
Glucose, Bld: 252 mg/dL — ABNORMAL HIGH (ref 65–99)
Potassium: 4.2 mmol/L (ref 3.5–5.1)
SODIUM: 141 mmol/L (ref 135–145)

## 2017-05-11 LAB — CBC
HCT: 30.2 % — ABNORMAL LOW (ref 40.0–52.0)
HEMOGLOBIN: 10.3 g/dL — AB (ref 13.0–18.0)
MCH: 30.1 pg (ref 26.0–34.0)
MCHC: 34.2 g/dL (ref 32.0–36.0)
MCV: 88.1 fL (ref 80.0–100.0)
Platelets: 247 10*3/uL (ref 150–440)
RBC: 3.43 MIL/uL — AB (ref 4.40–5.90)
RDW: 14.7 % — AB (ref 11.5–14.5)
WBC: 9.3 10*3/uL (ref 3.8–10.6)

## 2017-05-11 LAB — GLUCOSE, CAPILLARY
GLUCOSE-CAPILLARY: 280 mg/dL — AB (ref 65–99)
Glucose-Capillary: 246 mg/dL — ABNORMAL HIGH (ref 65–99)
Glucose-Capillary: 273 mg/dL — ABNORMAL HIGH (ref 65–99)
Glucose-Capillary: 330 mg/dL — ABNORMAL HIGH (ref 65–99)
Glucose-Capillary: 365 mg/dL — ABNORMAL HIGH (ref 65–99)
Glucose-Capillary: 374 mg/dL — ABNORMAL HIGH (ref 65–99)

## 2017-05-11 LAB — CULTURE, BLOOD (ROUTINE X 2)
CULTURE: NO GROWTH
CULTURE: NO GROWTH
SPECIAL REQUESTS: ADEQUATE
SPECIAL REQUESTS: ADEQUATE

## 2017-05-11 MED ORDER — INSULIN GLARGINE 100 UNIT/ML ~~LOC~~ SOLN
30.0000 [IU] | Freq: Every day | SUBCUTANEOUS | Status: DC
  Administered 2017-05-11: 30 [IU] via SUBCUTANEOUS
  Filled 2017-05-11 (×2): qty 0.3

## 2017-05-11 MED ORDER — ORAL CARE MOUTH RINSE: 15.0000 mL | Freq: Two times a day (BID) | OROMUCOSAL | Status: DC

## 2017-05-11 MED ORDER — FUROSEMIDE 10 MG/ML IJ SOLN
60.0000 mg | Freq: Three times a day (TID) | INTRAMUSCULAR | Status: DC
  Administered 2017-05-11 – 2017-05-12 (×4): 60 mg via INTRAVENOUS
  Filled 2017-05-11 (×4): qty 6

## 2017-05-11 MED ORDER — INSULIN ASPART 100 UNIT/ML ~~LOC~~ SOLN
0.0000 [IU] | SUBCUTANEOUS | Status: DC
  Administered 2017-05-11: 11 [IU] via SUBCUTANEOUS
  Administered 2017-05-11 – 2017-05-12 (×2): 20 [IU] via SUBCUTANEOUS
  Administered 2017-05-12 (×2): 7 [IU] via SUBCUTANEOUS
  Filled 2017-05-11: qty 20
  Filled 2017-05-11: qty 7
  Filled 2017-05-11: qty 20
  Filled 2017-05-11: qty 11
  Filled 2017-05-11: qty 7

## 2017-05-11 MED ORDER — CHLORHEXIDINE GLUCONATE 0.12 % MT SOLN: 15.0000 mL | Freq: Two times a day (BID) | OROMUCOSAL | Status: DC

## 2017-05-11 NOTE — Progress Notes (Signed)
[facility], [place] 05/11/17  Subjective:   Patient known to our practice from outpatient. He is followed by Dr Juleen China. Patient presented to ER with c/o diarrhea, vomiting for 2 days. He was prescribed Flagyl and Cipro as outpatient. He was noted to be hypoxic in ER and was placed on supplemental Oxygen. He underwent CT of chest, abdomen and pelvis with IV Contrast. No PE was noted. 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 Requiring NIPPV Reports chest pain radiating to arm and jaw  Objective:  Vital signs in last 24 hours:  Temp:  [98.4 F (36.9 C)-99.7 F (37.6 C)] 99 F (37.2 C) (06/02 0800) Pulse Rate:  [69-109] 101 (06/02 1030) Resp:  [17-32] 25 (06/02 1030) BP: (106-153)/(52-91) 135/69 (06/02 1002) SpO2:  [86 %-100 %] 98 % (06/02 1030) FiO2 (%):  [60 %-66 %] 60 % (06/02 1002)  Weight change:  Filed Weights   05/06/17 1857 05/07/17 0046 05/07/17 0615  Weight: 87.5 kg (193 lb) 86.6 kg (190 lb 14.4 oz) 86.2 kg (190 lb 0.6 oz)    Intake/Output:    Intake/Output Summary (Last 24 hours) at 05/11/17 1059 Last data filed at 05/11/17 1050  Gross per 24 hour  Intake              200 ml  Output             1705 ml  Net            -1505 ml     Physical Exam: General: NAD,   HEENT CPAP mask  Neck supple  Pulm/lungs Diffuse b/l crackles   CVS/Heart Tachycardic,  no rub  Abdomen:  Soft, Non tender  Extremities: No peripheral edema  Neurologic: Alert, oriented  Skin: No rashes          Basic Metabolic Panel:   Recent Labs Lab 05/07/17 0356 05/07/17 0637 05/08/17 0422 05/09/17 0549 05/10/17 1037 05/11/17 0426  NA  --  136 141 139 139 141  K  --  4.3 3.5 3.4* 3.8 4.2  CL  --  103 105 105 108 107  CO2  --  20* 25 26 22 23    GLUCOSE  --  238* 184* 170* 217* 252*  BUN  --  44* 49* 45* 35* 45*  CREATININE  --  2.32* 2.33* 1.99* 1.68* 1.82*  CALCIUM  --  7.9* 8.0* 7.9* 8.2* 8.5*  MG 1.7  --  1.9 1.9  --   --      CBC:  Recent Labs Lab 05/06/17 1901 05/07/17 0637 05/08/17 0422 05/09/17 0549 05/11/17 0426  WBC 11.7* 15.1* 9.3 8.5 9.3  NEUTROABS 10.4*  --   --   --   --   HGB 12.2* 11.6* 10.4* 9.9* 10.3*  HCT 36.7* 35.0* 30.8* 29.0* 30.2*  MCV 89.9 88.0 87.7 88.7 88.1  PLT 203 208 184 208 247     No results found for: HEPBSAG, HEPBSAB, HEPBIGM    Microbiology:  Recent Results (from the past 240 hour(s))  Blood Culture (routine x 2)     Status: None   Collection Time: 05/06/17  7:02 PM  Result Value Ref Range Status   Specimen Description BLOOD RIGHT HAND  Final   Special Requests   Final    BOTTLES DRAWN AEROBIC AND ANAEROBIC  Blood Culture adequate volume   Culture NO GROWTH 5 DAYS  Final   Report Status 05/11/2017 FINAL  Final  Blood Culture (routine x 2)     Status: None   Collection Time: 05/06/17  7:02 PM  Result Value Ref Range Status   Specimen Description BLOOD LEFT AC  Final   Special Requests   Final    BOTTLES DRAWN AEROBIC AND ANAEROBIC Blood Culture adequate volume   Culture NO GROWTH 5 DAYS  Final   Report Status 05/11/2017 FINAL  Final  Urine culture     Status: None   Collection Time: 05/06/17  7:18 PM  Result Value Ref Range Status   Specimen Description URINE, RANDOM  Final   Special Requests NONE  Final   Culture   Final    NO GROWTH Performed at [facility], [place] [address], [place], [place] [address]    Report Status 05/08/2017 FINAL  Final  Gastrointestinal Panel by PCR , Stool     Status: Abnormal   Collection Time: 05/07/17  4:44 AM  Result Value Ref Range Status   Campylobacter species NOT DETECTED NOT DETECTED Final   Plesimonas shigelloides NOT DETECTED NOT DETECTED Final   Salmonella species DETECTED (A) NOT DETECTED Final    Comment: RESULT  CALLED TO, READ BACK BY AND VERIFIED WITH: BETH BUONO 05/07/17 0642 ALV    Yersinia enterocolitica NOT DETECTED NOT DETECTED Final   Vibrio species NOT DETECTED NOT DETECTED Final   Vibrio cholerae NOT DETECTED NOT DETECTED Final   Enteroaggregative E coli (EAEC) NOT DETECTED NOT DETECTED Final   Enteropathogenic E coli (EPEC) NOT DETECTED NOT DETECTED Final   Enterotoxigenic E coli (ETEC) NOT DETECTED NOT DETECTED Final   Shiga like toxin producing E coli (STEC) NOT DETECTED NOT DETECTED Final   E. coli O157 NOT DETECTED NOT DETECTED Final   Shigella/Enteroinvasive E coli (EIEC) NOT DETECTED NOT DETECTED Final   Cryptosporidium NOT DETECTED NOT DETECTED Final   Cyclospora cayetanensis NOT DETECTED NOT DETECTED Final   Entamoeba histolytica NOT DETECTED NOT DETECTED Final   Giardia lamblia NOT DETECTED NOT DETECTED Final   Adenovirus F40/41 NOT DETECTED NOT DETECTED Final   Astrovirus NOT DETECTED NOT DETECTED Final   Norovirus GI/GII NOT DETECTED NOT DETECTED Final   Rotavirus A NOT DETECTED NOT DETECTED Final   Sapovirus (I, II, IV, and V) NOT DETECTED NOT DETECTED Final  C difficile quick scan w PCR reflex     Status: None   Collection Time: 05/07/17  4:44 AM  Result Value Ref Range Status   C Diff antigen NEGATIVE NEGATIVE Final   C Diff toxin NEGATIVE NEGATIVE Final   C Diff interpretation No C. difficile detected.  Final  MRSA PCR Screening     Status: None   Collection Time: 05/07/17  6:24 AM  Result Value Ref Range Status   MRSA by PCR NEGATIVE NEGATIVE Final    Comment:        The GeneXpert MRSA Assay (FDA approved for NASAL specimens only), is one component of a comprehensive MRSA colonization surveillance program. It is not intended to diagnose MRSA infection nor to guide or monitor treatment for MRSA infections.     Coagulation Studies: No results for input(s): LABPROT, INR in the last 72 hours.  Urinalysis: No results for input(s): COLORURINE, LABSPEC,  PHURINE, GLUCOSEU, HGBUR, BILIRUBINUR, KETONESUR, PROTEINUR, UROBILINOGEN, NITRITE, LEUKOCYTESUR in the last 72 hours.  Invalid input(s): APPERANCEUR    Imaging: Dg Chest  1 View  Result Date: 05/11/2017 CLINICAL DATA:  Shortness of breath EXAM: CHEST 1 VIEW COMPARISON:  05/10/2017 and prior radiograph FINDINGS: Cardiomegaly noted with slight decrease of interstitial pulmonary edema. There may be trace bilateral pleural effusions present. There is no evidence of focal airspace disease, suspicious pulmonary nodule/mass, or pneumothorax. No acute bony abnormalities are identified. IMPRESSION: Decreased interstitial pulmonary edema. Electronically Signed   By: Margarette Canada M.D.   On: 05/11/2017 07:12   Dg Chest Port 1 View  Result Date: 05/10/2017 CLINICAL DATA:  Shortness of Breath EXAM: PORTABLE CHEST 1 VIEW COMPARISON:  May 08, 2017 FINDINGS: There is moderate interstitial pulmonary edema, increased from recent study. There it are minimal pleural effusions bilaterally. There is cardiomegaly with pulmonary venous hypertension. No airspace consolidation. No adenopathy. No bone lesions. IMPRESSION: Congestive heart failure with increased edema compared to recent study. Stable cardiac prominence. Electronically Signed   By: Lowella Grip III M.D.   On: 05/10/2017 10:07     Medications:    . allopurinol  200 mg Oral BID  . amLODipine  5 mg Oral BID  . budesonide (PULMICORT) nebulizer solution  0.5 mg Nebulization BID  . chlorhexidine  15 mL Mouth Rinse BID  . ciprofloxacin  500 mg Oral BID  . citalopram  10 mg Oral Daily  . clopidogrel  75 mg Oral Daily  . enoxaparin (LOVENOX) injection  40 mg Subcutaneous Q24H  . furosemide  20 mg Intravenous Q8H  . insulin aspart  0-15 Units Subcutaneous Q4H  . insulin glargine  10 Units Subcutaneous QHS  . ipratropium-albuterol  3 mL Nebulization Q4H  . loratadine  10 mg Oral Daily  . mouth rinse  15 mL Mouth Rinse q12n4p  . methylPREDNISolone  (SOLU-MEDROL) injection  40 mg Intravenous Q12H  . metoprolol tartrate  50 mg Oral QID  . pantoprazole  40 mg Oral BID  . rosuvastatin  40 mg Oral Daily  . sodium chloride flush  3 mL Intravenous Q12H  . sucralfate  1 g Oral TID WC & HS  . Vitamin D (Ergocalciferol)  50,000 Units Oral Q30 days   acetaminophen, acetaminophen, chlorpheniramine-HYDROcodone, guaiFENesin **AND** [DISCONTINUED] dextromethorphan, morphine injection, nitroGLYCERIN, ondansetron (ZOFRAN) IV  Assessment/ Plan:  76 y.o. 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 SOB - Pulm edema   - increase dose of iv lasix  3. DM-2 with CKD - Baseline Cr 1.7/GFR 38 as outpatient - HbA1c 8.2 % (05/02/17)  4. Acute gastroenteritis - Salmonella detected - C diff negative  5. Acute NSTEMI - Mgmt as per ICU team/Cardiology. - coronary angiography 5/31- medical management recommended    LOS: 5 Debroh Sieloff 6/2/201810:59 AM  [facility] [place], [place]

## 2017-05-11 NOTE — Progress Notes (Signed)
Pt had run of SVT this AM. HR into 150s. Pt nonsymptomatic, NP made aware.

## 2017-05-11 NOTE — Progress Notes (Signed)
Patient alert and oriented, vital signs stable.  Patient on HFNC, tolerating okay at this time.  Episode of jaw/arm pain this am, resolved with nitro.  FSBS trending up, noted that patient receiving only 10 of lantus at bedtime when bedtime dose call 55, will pass on to night nurse to ask NP to increase dose. Lasix dose increased by Dr Candiss Norse, voiding via urinal.  Patient current sitting in bed eating dinner and watching TV in no apparent distress.

## 2017-05-11 NOTE — Progress Notes (Signed)
Patient called out with complaint of jaw pain and left arm pain, Charge Nurse Sarah checked on patient and administered nitro x2.  Patient pain improved, currently denies any pain/discomfort at this time, resting comfortable in bed while watching TV.  Remains on bipap.  RN continue to monitor.

## 2017-05-11 NOTE — Progress Notes (Signed)
RN clarified with Dr Candiss Norse regarding lasix dose of 60mg  ordered for now, patient received 20mg  this am and schedule to get next dose at 1800, MD states "go ahead and give the new dose now"

## 2017-05-11 NOTE — Progress Notes (Signed)
[facility] [facility] [place] NAME: Mike Decker    MR#:  [id number]  DATE OF BIRTH:  10-04-1941  SUBJECTIVE:  CHIEF COMPLAINT:   Chief Complaint  Patient presents with  . Diarrhea  . Emesis   Shortness of breath is improving slowly. Although still significantly short of breath. Cough. Clear sputum.  REVIEW OF SYSTEMS:  CONSTITUTIONAL: No fever, fatigue or weakness.  EYES: No blurred or double vision.  EARS, NOSE, AND THROAT: No tinnitus or ear pain.  RESPIRATORY: No cough, positive for shortness of breath,no wheezing or hemoptysis.  CARDIOVASCULAR: No chest pain, orthopnea, edema.  GASTROINTESTINAL: No nausea, vomiting, diarrhea or abdominal pain.  GENITOURINARY: No dysuria, hematuria.  ENDOCRINE: No polyuria, nocturia,  HEMATOLOGY: No anemia, easy bruising or bleeding SKIN: No rash or lesion. MUSCULOSKELETAL: No joint pain or arthritis.   NEUROLOGIC: No tingling, numbness, weakness.  PSYCHIATRY: No anxiety or depression.   ROS  DRUG ALLERGIES:   Allergies  Allergen Reactions  . Peanut-Containing Drug Products Anaphylaxis  . Penicillins Hives and Rash    Has patient had a PCN reaction causing immediate rash, facial/tongue/throat swelling, SOB or lightheadedness with hypotension: Yes Has patient had a PCN reaction causing severe rash involving mucus membranes or skin necrosis: No Has patient had a PCN reaction that required hospitalization No Has patient had a PCN reaction occurring within the last 10 years: No If all of the above answers are "NO", then may proceed with Cephalosporin use.  . Bee Venom Swelling  . Influenza Vaccines Hives  . Inh [Isoniazid] Hives  . Kenalog [Triamcinolone Acetonide] Hives  . Nalfon [Fenoprofen Calcium] Hives    VITALS:  Blood pressure 132/66, pulse 100, temperature 99 F (37.2 C), temperature source Oral, resp. rate (!) 25, height 5\' 7"  (1.702 m), weight 86.2 kg (190 lb 0.6 oz), SpO2 95  %.  PHYSICAL EXAMINATION:  GENERAL:  76 y.o.-year-old patient lying in the bed . Looks critically ill on high flow nasal cannula.  EYES: Pupils equal, round, reactive to light and accommodation. No scleral icterus. Extraocular muscles intact.  HEENT: Head atraumatic, normocephalic. Oropharynx and nasopharynx clear.  NECK:  Supple, no jugular venous distention. No thyroid enlargement, no tenderness.  LUNGS: ilateral crackles. CARDIOVASCULAR: S1, S2 normal. No murmurs, rubs, or gallops.  ABDOMEN: Soft, nontender, nondistended. Bowel sounds present. No organomegaly or mass.  EXTREMITIES: No pedal edema, cyanosis, or clubbing.  NEUROLOGIC: Cranial nerves II through XII are intact. Muscle strength 5/5 in all extremities. Sensation intact. Gait not checked.  PSYCHIATRIC: The patient is alert and oriented x 3.  SKIN: No obvious rash, lesion, or ulcer.   Physical Exam LABORATORY PANEL:   CBC  Recent Labs Lab 05/11/17 0426  WBC 9.3  HGB 10.3*  HCT 30.2*  PLT 247   ------------------------------------------------------------------------------------------------------------------  Chemistries   Recent Labs Lab 05/06/17 1901  05/09/17 0549  05/11/17 0426  NA 135  < > 139  < > 141  K 3.5  < > 3.4*  < > 4.2  CL 101  < > 105  < > 107  CO2 22  < > 26  < > 23  GLUCOSE 120*  < > 170*  < > 252*  BUN 37*  < > 45*  < > 45*  CREATININE 1.99*  < > 1.99*  < > 1.82*  CALCIUM 8.4*  < > 7.9*  < > 8.5*  MG  --   < > 1.9  --   --  AST 24  --   --   --   --   ALT 13*  --   --   --   --   ALKPHOS 69  --   --   --   --   BILITOT 0.7  --   --   --   --   < > = values in this interval not displayed. ------------------------------------------------------------------------------------------------------------------  Cardiac Enzymes  Recent Labs Lab 05/07/17 0950 05/07/17 1640  TROPONINI 21.51* 23.72*    ------------------------------------------------------------------------------------------------------------------  RADIOLOGY:  Dg Chest 1 View  Result Date: 05/11/2017 CLINICAL DATA:  Shortness of breath EXAM: CHEST 1 VIEW COMPARISON:  05/10/2017 and prior radiograph FINDINGS: Cardiomegaly noted with slight decrease of interstitial pulmonary edema. There may be trace bilateral pleural effusions present. There is no evidence of focal airspace disease, suspicious pulmonary nodule/mass, or pneumothorax. No acute bony abnormalities are identified. IMPRESSION: Decreased interstitial pulmonary edema. Electronically Signed   By: Margarette Canada M.D.   On: 05/11/2017 07:12   Dg Chest Port 1 View  Result Date: 05/10/2017 CLINICAL DATA:  Shortness of Breath EXAM: PORTABLE CHEST 1 VIEW COMPARISON:  May 08, 2017 FINDINGS: There is moderate interstitial pulmonary edema, increased from recent study. There it are minimal pleural effusions bilaterally. There is cardiomegaly with pulmonary venous hypertension. No airspace consolidation. No adenopathy. No bone lesions. IMPRESSION: Congestive heart failure with increased edema compared to recent study. Stable cardiac prominence. Electronically Signed   By: Lowella Grip III M.D.   On: 05/10/2017 10:07    ASSESSMENT AND PLAN:   Active Problems:   NSTEMI (non-ST elevated myocardial infarction) ([place])  #. NSTEMI, history of coronary artery disease -  with telemetry monitoring, continuous pulse ox - Checked echo- EF 55% - Heparin drip given and now stopped -Continue Lopressor, nitroglycerin, Plavix, Crestor - Cardiology consult appreciated. - Cath done 05/09/17- double vessel CAD, previous RCA stent is patent. Medical management.  #. Acute hypoxic respiratory failure due to Pulmonary edema with pleural effusion, secondary to ac diastolic CHF. Now also  has A fib with RVR. - Intake/output, daily weight. Lasix. - Mednebs, O2 therapy as needed - Cardiology  following - On IV Lasix. Monitor while being diuresed  #. Some Allegra gastroenteritis. On  antibiotics.  #. CKD3, Creatinine near baseline - Monitor BMP as patient had IV contrast - nephrologist on case. - stable after cath, now will be on lasix.  #. H/o Diabetes Accu-Cheks and insulin per ICU protocol  #. History of COPD -Continue Symbicort, duoneb.   #. History of hypertension -Continue Norvasc, Lopressor Clonidine stopped Lisinopril stopped due to acute kidney injury  #. History of Barrett's esophagus/GERD -Continue Carafate, Protonix  #. History of depression -Continue Celexa  #. History of gout On allopurinol, colchicine  #. History of obstructive sleep apnea   All the records are reviewed and case discussed with Care Management/Social Workerr. Management plans discussed with the patient, family and they are in agreement.  CODE STATUS: Full.  TOTAL TIME TAKING CARE OF THIS PATIENT: 30 minutes.   Hillary Bow R M.D on 05/11/2017   Between 7am to 6pm - Pager - [phone]  After 6pm go to [email or web address] - password [email or web address] [facility]  Office  [phone]  CC: Primary care physician; Cletis Athens, MD  Note: This dictation was prepared with Dragon dictation along with smaller phrase technology. Any transcriptional errors that result from this process are unintentional.

## 2017-05-11 NOTE — Progress Notes (Signed)
Called [name], NP r/t pt.'s increasing blood sugar (see MAR for details) Discussed sliding scale, suggested resistant scale and increase in pt.'s lantus dose. NP ordered change to resistant scale and increased lantus to 30 units at bedtime. Will continue to monitor pt. Closely.

## 2017-05-11 NOTE — Progress Notes (Signed)
Protective pad placed on pt's nose. No breakdown noted.

## 2017-05-11 NOTE — Progress Notes (Signed)
Placed patient back on HFNC of 45L and 65%. Tol well at this time, RN notified of change.

## 2017-05-11 NOTE — Progress Notes (Signed)
   Name: Mike Decker MRN: [id number] DOB: 1941-07-21    ADMISSION DATE:  05/06/2017  BRIEF PATIENT DESCRIPTION:  76 yo male admitted 05/28 with acute on chronic hypoxic respiratory failure secondary to AECOPD and pulmonary edema, NSTEMI, and abdominal pain with diarrhea.  Transferred from telemetry unit to ICU 05/29 with worsening acute respiratory failure requiring continuous Bipap and had a brief period of afib with RVR   SIGNIFICANT EVENTS  05/28-Pt admitted to the telemetry unit  05/29-Pt transferred to ICU  05/30- Increased CP with Afib/RVR, ST depressions 05/30-cardiac cath; chronic disease with a stent. Recommend medical management.  STUDIES:  CT Abd Pelvis and CT Angio Chest 05/28>>No definite evidence pulmonary embolus. Aortic atherosclerosis. Coronary calcifications are noted suggesting coronary artery disease. Bilateral pulmonary edema is noted with minimal pleural effusions. Moderate size fat containing right inguinal hernia. 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 Dilated gallbladder is noted without evidence of inflammation. No cholelithiasis is noted.   SUBJECTIVE: No complaints at this time   VITAL SIGNS: Temp:  [98.4 F (36.9 C)-99.7 F (37.6 C)] 99 F (37.2 C) (06/02 0800) Pulse Rate:  [69-109] 100 (06/02 1200) Resp:  [17-32] 25 (06/02 1200) BP: (106-147)/(52-89) 132/66 (06/02 1200) SpO2:  [86 %-100 %] 95 % (06/02 1200) FiO2 (%):  [60 %-66 %] 60 % (06/02 1002)  PHYSICAL EXAMINATION: General: well developed, well nourished Caucasian male, NAD Neuro: alert and oriented, follows commands HEENT: supple, no JVD Cardiovascular: nsr, s1s2, no M/R/G Lungs: diminished throughout, even, non labored , scattered rhonchi Abdomen: +BS x4, soft, obese, non distended, non tender Musculoskeletal: normal bulk and tone, no edema  Skin: intact no rashes or lesions    Recent Labs Lab 05/09/17 0549 05/10/17 1037  05/11/17 0426  NA 139 139 141  K 3.4* 3.8 4.2  CL 105 108 107  CO2 26 22 23   BUN 45* 35* 45*  CREATININE 1.99* 1.68* 1.82*  GLUCOSE 170* 217* 252*    Recent Labs Lab 05/08/17 0422 05/09/17 0549 05/11/17 0426  HGB 10.4* 9.9* 10.3*  HCT 30.8* 29.0* 30.2*  WBC 9.3 8.5 9.3  PLT 184 208 247   ASSESSMENT / PLAN: Acute on chronic hypoxic respiratory failure secondary pulmonary edema, I personally reviewed. Chest x-ray images, this continues to show pulmonary edema, though this is improved from previous. Afib with RVR-resolved  NSTEMI, Status post 5/31; patent stent. Acute on chronic renal failure Salmonella Gastroenteritis  Hx: OSA (Bipap qhs), CKD Stage III, Diverticulosis, Barrett Esophagus, HTN, Hyperlipidemia, HTN, and MI  P: HFNC for now to maintain O2 sats 88% to 92% or for dyspnea wean as tolerated   BiPAP daily at bedtime and as needed.  We'll continue antibiotics to complete course for Salmonella enteritis. Continue heparin gtt dosing per pharmacy SSI and lantus qhs  Continue IV diuresis. Repeat chest x-ray in a.m.    Marda Stalker, M.D.  05/11/2017

## 2017-05-12 ENCOUNTER — Inpatient Hospital Stay: Payer: Medicare Other

## 2017-05-12 LAB — BASIC METABOLIC PANEL
ANION GAP: 10 (ref 5–15)
BUN: 61 mg/dL — ABNORMAL HIGH (ref 6–20)
CALCIUM: 8.7 mg/dL — AB (ref 8.9–10.3)
CHLORIDE: 101 mmol/L (ref 101–111)
CO2: 25 mmol/L (ref 22–32)
Creatinine, Ser: 1.87 mg/dL — ABNORMAL HIGH (ref 0.61–1.24)
GFR calc non Af Amer: 33 mL/min — ABNORMAL LOW (ref >60)
GFR, EST AFRICAN AMERICAN: 38 mL/min — AB (ref >60)
Glucose, Bld: 230 mg/dL — ABNORMAL HIGH (ref 65–99)
Potassium: 3.9 mmol/L (ref 3.5–5.1)
Sodium: 136 mmol/L (ref 135–145)

## 2017-05-12 LAB — CBC
HEMATOCRIT: 30 % — AB (ref 40.0–52.0)
Hemoglobin: 10.1 g/dL — ABNORMAL LOW (ref 13.0–18.0)
MCH: 29.1 pg (ref 26.0–34.0)
MCHC: 33.6 g/dL (ref 32.0–36.0)
MCV: 86.6 fL (ref 80.0–100.0)
Platelets: 324 10*3/uL (ref 150–440)
RBC: 3.47 MIL/uL — ABNORMAL LOW (ref 4.40–5.90)
RDW: 14.8 % — ABNORMAL HIGH (ref 11.5–14.5)
WBC: 16.7 10*3/uL — AB (ref 3.8–10.6)

## 2017-05-12 LAB — GLUCOSE, CAPILLARY
GLUCOSE-CAPILLARY: 379 mg/dL — AB (ref 65–99)
Glucose-Capillary: 211 mg/dL — ABNORMAL HIGH (ref 65–99)
Glucose-Capillary: 234 mg/dL — ABNORMAL HIGH (ref 65–99)
Glucose-Capillary: 310 mg/dL — ABNORMAL HIGH (ref 65–99)
Glucose-Capillary: 409 mg/dL — ABNORMAL HIGH (ref 65–99)
Glucose-Capillary: 422 mg/dL — ABNORMAL HIGH (ref 65–99)
Glucose-Capillary: 444 mg/dL — ABNORMAL HIGH (ref 65–99)

## 2017-05-12 LAB — GLUCOSE, RANDOM: Glucose, Bld: 436 mg/dL — ABNORMAL HIGH (ref 65–99)

## 2017-05-12 MED ORDER — SODIUM CHLORIDE 0.9 % IV SOLN
INTRAVENOUS | Status: DC
  Administered 2017-05-12: 21:00:00 via INTRAVENOUS

## 2017-05-12 MED ORDER — INSULIN GLARGINE 100 UNIT/ML ~~LOC~~ SOLN
50.0000 [IU] | Freq: Every day | SUBCUTANEOUS | Status: DC
  Administered 2017-05-12 – 2017-05-13 (×2): 50 [IU] via SUBCUTANEOUS
  Filled 2017-05-12 (×3): qty 0.5

## 2017-05-12 MED ORDER — DOXYCYCLINE HYCLATE 100 MG PO TABS
100.0000 mg | ORAL_TABLET | Freq: Two times a day (BID) | ORAL | Status: DC
  Administered 2017-05-12 (×2): 100 mg via ORAL
  Filled 2017-05-12 (×2): qty 1

## 2017-05-12 MED ORDER — DEXTROSE 50 % IV SOLN: 25.0000 mL | INTRAVENOUS | Status: DC | PRN

## 2017-05-12 MED ORDER — DEXTROSE-NACL 5-0.45 % IV SOLN
INTRAVENOUS | Status: DC
  Administered 2017-05-12: 23:00:00 via INTRAVENOUS

## 2017-05-12 MED ORDER — SODIUM CHLORIDE 0.9 % IV SOLN
INTRAVENOUS | Status: DC
  Administered 2017-05-12: 3.8 [IU]/h via INTRAVENOUS
  Filled 2017-05-12: qty 1

## 2017-05-12 MED ORDER — INSULIN ASPART 100 UNIT/ML ~~LOC~~ SOLN
30.0000 [IU] | Freq: Once | SUBCUTANEOUS | Status: AC
  Administered 2017-05-12: 30 [IU] via SUBCUTANEOUS
  Filled 2017-05-12: qty 30

## 2017-05-12 MED ORDER — FUROSEMIDE 10 MG/ML IJ SOLN
40.0000 mg | Freq: Three times a day (TID) | INTRAMUSCULAR | Status: DC
  Administered 2017-05-12 – 2017-05-13 (×2): 40 mg via INTRAVENOUS
  Filled 2017-05-12 (×3): qty 4

## 2017-05-12 NOTE — Progress Notes (Signed)
Placed patient on HFNC 45l and 60% from BIPAP.

## 2017-05-12 NOTE — Progress Notes (Signed)
[facility], [place] 05/12/17  Subjective:   Patient known to our practice from outpatient. He is followed by Dr Juleen China. Patient presented to ER with c/o diarrhea, vomiting for 2 days. He was prescribed Flagyl and Cipro as outpatient. He was noted to be hypoxic in ER and was placed on supplemental Oxygen. He underwent CT of chest, abdomen and pelvis with IV Contrast. No PE was noted. 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 Lasix Dose was increased to 60 mg IV 3 times a day. It has resulted in good response. Urine output 2780 cc Feels better today however still requiring high flow nasal cannula oxygen supplementation  Objective:  Vital signs in last 24 hours:  Temp:  [98.5 F (36.9 C)-99 F (37.2 C)] 98.7 F (37.1 C) (06/03 0800) Pulse Rate:  [67-113] 76 (06/03 0800) Resp:  [15-28] 19 (06/03 0800) BP: (85-147)/(45-116) 140/70 (06/03 0800) SpO2:  [92 %-100 %] 95 % (06/03 0810) FiO2 (%):  [60 %-65 %] 60 % (06/03 0810)  Weight change:  Filed Weights   05/06/17 1857 05/07/17 0046 05/07/17 0615  Weight: 87.5 kg (193 lb) 86.6 kg (190 lb 14.4 oz) 86.2 kg (190 lb 0.6 oz)    Intake/Output:    Intake/Output Summary (Last 24 hours) at 05/12/17 1023 Last data filed at 05/12/17 1003  Gross per 24 hour  Intake          1566.95 ml  Output             3380 ml  Net         -1813.05 ml     Physical Exam: General: NAD,   HEENT HFNC O2  Neck supple  Pulm/lungs Mild basilar crackles   CVS/Heart Tachycardic,  no rub  Abdomen:  Soft, Non tender  Extremities: No peripheral edema  Neurologic: Alert, oriented  Skin: No rashes          Basic Metabolic Panel:   Recent Labs Lab 05/07/17 0356  05/08/17 0422 05/09/17 0549 05/10/17 1037 05/11/17 0426  05/12/17 0426  NA  --   < > 141 139 139 141 136  K  --   < > 3.5 3.4* 3.8 4.2 3.9  CL  --   < > 105 105 108 107 101  CO2  --   < > 25 26 22 23 25   GLUCOSE  --   < > 184* 170* 217* 252* 230*  BUN  --   < > 49* 45* 35* 45* 61*  CREATININE  --   < > 2.33* 1.99* 1.68* 1.82* 1.87*  CALCIUM  --   < > 8.0* 7.9* 8.2* 8.5* 8.7*  MG 1.7  --  1.9 1.9  --   --   --   < > = values in this interval not displayed.   CBC:  Recent Labs Lab 05/06/17 1901 05/07/17 6045 05/08/17 0422 05/09/17 0549 05/11/17 0426 05/12/17 0426  WBC 11.7* 15.1* 9.3 8.5 9.3 16.7*  NEUTROABS 10.4*  --   --   --   --   --   HGB 12.2* 11.6* 10.4* 9.9* 10.3* 10.1*  HCT 36.7* 35.0* 30.8* 29.0* 30.2* 30.0*  MCV 89.9 88.0 87.7 88.7 88.1 86.6  PLT 203 208 184 208 247 324     No results found for: HEPBSAG, HEPBSAB, HEPBIGM  Microbiology:  Recent Results (from the past 240 hour(s))  Blood Culture (routine x 2)     Status: None   Collection Time: 05/06/17  7:02 PM  Result Value Ref Range Status   Specimen Description BLOOD RIGHT HAND  Final   Special Requests   Final    BOTTLES DRAWN AEROBIC AND ANAEROBIC Blood Culture adequate volume   Culture NO GROWTH 5 DAYS  Final   Report Status 05/11/2017 FINAL  Final  Blood Culture (routine x 2)     Status: None   Collection Time: 05/06/17  7:02 PM  Result Value Ref Range Status   Specimen Description BLOOD LEFT AC  Final   Special Requests   Final    BOTTLES DRAWN AEROBIC AND ANAEROBIC Blood Culture adequate volume   Culture NO GROWTH 5 DAYS  Final   Report Status 05/11/2017 FINAL  Final  Urine culture     Status: None   Collection Time: 05/06/17  7:18 PM  Result Value Ref Range Status   Specimen Description URINE, RANDOM  Final   Special Requests NONE  Final   Culture   Final    NO GROWTH Performed at [facility], [place] [address], [place], [place] [address]    Report Status 05/08/2017 FINAL  Final  Gastrointestinal Panel by PCR , Stool     Status:  Abnormal   Collection Time: 05/07/17  4:44 AM  Result Value Ref Range Status   Campylobacter species NOT DETECTED NOT DETECTED Final   Plesimonas shigelloides NOT DETECTED NOT DETECTED Final   Salmonella species DETECTED (A) NOT DETECTED Final    Comment: RESULT CALLED TO, READ BACK BY AND VERIFIED WITH: BETH BUONO 05/07/17 0642 ALV    Yersinia enterocolitica NOT DETECTED NOT DETECTED Final   Vibrio species NOT DETECTED NOT DETECTED Final   Vibrio cholerae NOT DETECTED NOT DETECTED Final   Enteroaggregative E coli (EAEC) NOT DETECTED NOT DETECTED Final   Enteropathogenic E coli (EPEC) NOT DETECTED NOT DETECTED Final   Enterotoxigenic E coli (ETEC) NOT DETECTED NOT DETECTED Final   Shiga like toxin producing E coli (STEC) NOT DETECTED NOT DETECTED Final   E. coli O157 NOT DETECTED NOT DETECTED Final   Shigella/Enteroinvasive E coli (EIEC) NOT DETECTED NOT DETECTED Final   Cryptosporidium NOT DETECTED NOT DETECTED Final   Cyclospora cayetanensis NOT DETECTED NOT DETECTED Final   Entamoeba histolytica NOT DETECTED NOT DETECTED Final   Giardia lamblia NOT DETECTED NOT DETECTED Final   Adenovirus F40/41 NOT DETECTED NOT DETECTED Final   Astrovirus NOT DETECTED NOT DETECTED Final   Norovirus GI/GII NOT DETECTED NOT DETECTED Final   Rotavirus A NOT DETECTED NOT DETECTED Final   Sapovirus (I, II, IV, and V) NOT DETECTED NOT DETECTED Final  C difficile quick scan w PCR reflex     Status: None   Collection Time: 05/07/17  4:44 AM  Result Value Ref Range Status   C Diff antigen NEGATIVE NEGATIVE Final   C Diff toxin NEGATIVE NEGATIVE Final   C Diff interpretation No C. difficile detected.  Final  MRSA PCR Screening     Status: None   Collection Time: 05/07/17  6:24 AM  Result Value Ref Range Status   MRSA by PCR NEGATIVE NEGATIVE Final    Comment:        The GeneXpert MRSA Assay (FDA approved for NASAL specimens only), is one component of a comprehensive MRSA colonization surveillance  program. It is not intended to diagnose MRSA  infection nor to guide or monitor treatment for MRSA infections.     Coagulation Studies: No results for input(s): LABPROT, INR in the last 72 hours.  Urinalysis: No results for input(s): COLORURINE, LABSPEC, PHURINE, GLUCOSEU, HGBUR, BILIRUBINUR, KETONESUR, PROTEINUR, UROBILINOGEN, NITRITE, LEUKOCYTESUR in the last 72 hours.  Invalid input(s): APPERANCEUR    Imaging: Dg Chest 1 View  Result Date: 05/12/2017 CLINICAL DATA:  Shortness of breath EXAM: CHEST 1 VIEW COMPARISON:  05/11/2017 and prior exams FINDINGS: Cardiomegaly, pulmonary vascular congestion and very mild interstitial pulmonary edema again noted. There is no evidence of focal airspace disease, pulmonary edema, suspicious pulmonary nodule/mass, pleural effusion, or pneumothorax. No acute bony abnormalities are identified. IMPRESSION: Unchanged appearance of the chest with cardiomegaly and very mild interstitial pulmonary edema. Electronically Signed   By: Margarette Canada M.D.   On: 05/12/2017 06:58   Dg Chest 1 View  Result Date: 05/11/2017 CLINICAL DATA:  Shortness of breath EXAM: CHEST 1 VIEW COMPARISON:  05/10/2017 and prior radiograph FINDINGS: Cardiomegaly noted with slight decrease of interstitial pulmonary edema. There may be trace bilateral pleural effusions present. There is no evidence of focal airspace disease, suspicious pulmonary nodule/mass, or pneumothorax. No acute bony abnormalities are identified. IMPRESSION: Decreased interstitial pulmonary edema. Electronically Signed   By: Margarette Canada M.D.   On: 05/11/2017 07:12     Medications:    . allopurinol  200 mg Oral BID  . amLODipine  5 mg Oral BID  . budesonide (PULMICORT) nebulizer solution  0.5 mg Nebulization BID  . chlorhexidine  15 mL Mouth Rinse BID  . ciprofloxacin  500 mg Oral BID  . citalopram  10 mg Oral Daily  . clopidogrel  75 mg Oral Daily  . enoxaparin (LOVENOX) injection  40 mg Subcutaneous Q24H  .  furosemide  60 mg Intravenous Q8H  . insulin aspart  0-20 Units Subcutaneous Q4H  . insulin glargine  30 Units Subcutaneous QHS  . ipratropium-albuterol  3 mL Nebulization Q4H  . loratadine  10 mg Oral Daily  . mouth rinse  15 mL Mouth Rinse q12n4p  . mouth rinse  15 mL Mouth Rinse q12n4p  . mouth rinse  15 mL Mouth Rinse q12n4p  . mouth rinse  15 mL Mouth Rinse q12n4p  . methylPREDNISolone (SOLU-MEDROL) injection  40 mg Intravenous Q12H  . metoprolol tartrate  50 mg Oral QID  . pantoprazole  40 mg Oral BID  . rosuvastatin  40 mg Oral Daily  . sodium chloride flush  3 mL Intravenous Q12H  . sucralfate  1 g Oral TID WC & HS  . Vitamin D (Ergocalciferol)  50,000 Units Oral Q30 days   acetaminophen, acetaminophen, chlorpheniramine-HYDROcodone, guaiFENesin **AND** [DISCONTINUED] dextromethorphan, morphine injection, nitroGLYCERIN, ondansetron (ZOFRAN) IV  Assessment/ Plan:  76 y.o. 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   2. SOB - Pulm edema   - Continue IV Lasix today and consider changing to oral tomorrow  3. DM-2 with CKD - Baseline Cr 1.7/GFR 38 as outpatient - HbA1c 8.2 % (05/02/17)  4. Acute gastroenteritis - Salmonella detected - C diff negative  5. Acute NSTEMI - Mgmt as per ICU team/Cardiology. - coronary angiography 5/31- medical management recommended    LOS: 6 Javonta Gronau 6/3/201810:23 [place], [place]

## 2017-05-12 NOTE — Progress Notes (Signed)
Patient has been weaned to 6 liter nasal cannula. Notified NP Tukov.  Patient tolerating well

## 2017-05-12 NOTE — Progress Notes (Signed)
[facility] [place] NAME: Mike Decker    MR#:  [id number]  DATE OF BIRTH:  May 30, 1941  SUBJECTIVE:  CHIEF COMPLAINT:   Chief Complaint  Patient presents with  . Diarrhea  . Emesis   Shortness of breath is improving slowly. Although still significantly short of breath. Cough.  Bipap at night  REVIEW OF SYSTEMS:  CONSTITUTIONAL: No fever, fatigue or weakness.  EYES: No blurred or double vision.  EARS, NOSE, AND THROAT: No tinnitus or ear pain.  RESPIRATORY: No cough, positive for shortness of breath,no wheezing or hemoptysis.  CARDIOVASCULAR: No chest pain, orthopnea, edema.  GASTROINTESTINAL: No nausea, vomiting, diarrhea or abdominal pain.  GENITOURINARY: No dysuria, hematuria.  ENDOCRINE: No polyuria, nocturia,  HEMATOLOGY: No anemia, easy bruising or bleeding SKIN: No rash or lesion. MUSCULOSKELETAL: No joint pain or arthritis.   NEUROLOGIC: No tingling, numbness, weakness.  PSYCHIATRY: No anxiety or depression.   ROS  DRUG ALLERGIES:   Allergies  Allergen Reactions  . Peanut-Containing Drug Products Anaphylaxis  . Penicillins Hives and Rash    Has patient had a PCN reaction causing immediate rash, facial/tongue/throat swelling, SOB or lightheadedness with hypotension: Yes Has patient had a PCN reaction causing severe rash involving mucus membranes or skin necrosis: No Has patient had a PCN reaction that required hospitalization No Has patient had a PCN reaction occurring within the last 10 years: No If all of the above answers are "NO", then may proceed with Cephalosporin use.  . Bee Venom Swelling  . Influenza Vaccines Hives  . Inh [Isoniazid] Hives  . Kenalog [Triamcinolone Acetonide] Hives  . Nalfon [Fenoprofen Calcium] Hives    VITALS:  Blood pressure 140/70, pulse 76, temperature 98.7 F (37.1 C), resp. rate 19, height 5\' 7"  (1.702 m), weight 86.2 kg (190 lb 0.6 oz), SpO2 95 %.  PHYSICAL EXAMINATION:   GENERAL:  76 y.o.-year-old patient lying in the bed . Looks critically ill on high flow nasal cannula.  EYES: Pupils equal, round, reactive to light and accommodation. No scleral icterus. Extraocular muscles intact.  HEENT: Head atraumatic, normocephalic. Oropharynx and nasopharynx clear.  NECK:  Supple, no jugular venous distention. No thyroid enlargement, no tenderness.  LUNGS: ilateral crackles. CARDIOVASCULAR: S1, S2 normal. No murmurs, rubs, or gallops.  ABDOMEN: Soft, nontender, nondistended. Bowel sounds present. No organomegaly or mass.  EXTREMITIES: No pedal edema, cyanosis, or clubbing.  NEUROLOGIC: Cranial nerves II through XII are intact. Muscle strength 5/5 in all extremities. Sensation intact. Gait not checked.  PSYCHIATRIC: The patient is alert and oriented x 3.  SKIN: No obvious rash, lesion, or ulcer.   Physical Exam LABORATORY PANEL:   CBC  Recent Labs Lab 05/12/17 0426  WBC 16.7*  HGB 10.1*  HCT 30.0*  PLT 324   ------------------------------------------------------------------------------------------------------------------  Chemistries   Recent Labs Lab 05/06/17 1901  05/09/17 0549  05/12/17 0426  NA 135  < > 139  < > 136  K 3.5  < > 3.4*  < > 3.9  CL 101  < > 105  < > 101  CO2 22  < > 26  < > 25  GLUCOSE 120*  < > 170*  < > 230*  BUN 37*  < > 45*  < > 61*  CREATININE 1.99*  < > 1.99*  < > 1.87*  CALCIUM 8.4*  < > 7.9*  < > 8.7*  MG  --   < > 1.9  --   --  AST 24  --   --   --   --   ALT 13*  --   --   --   --   ALKPHOS 69  --   --   --   --   BILITOT 0.7  --   --   --   --   < > = values in this interval not displayed. ------------------------------------------------------------------------------------------------------------------  Cardiac Enzymes  Recent Labs Lab 05/07/17 0950 05/07/17 1640  TROPONINI 21.51* 23.72*    ------------------------------------------------------------------------------------------------------------------  RADIOLOGY:  Dg Chest 1 View  Result Date: 05/12/2017 CLINICAL DATA:  Shortness of breath EXAM: CHEST 1 VIEW COMPARISON:  05/11/2017 and prior exams FINDINGS: Cardiomegaly, pulmonary vascular congestion and very mild interstitial pulmonary edema again noted. There is no evidence of focal airspace disease, pulmonary edema, suspicious pulmonary nodule/mass, pleural effusion, or pneumothorax. No acute bony abnormalities are identified. IMPRESSION: Unchanged appearance of the chest with cardiomegaly and very mild interstitial pulmonary edema. Electronically Signed   By: Margarette Canada M.D.   On: 05/12/2017 06:58   Dg Chest 1 View  Result Date: 05/11/2017 CLINICAL DATA:  Shortness of breath EXAM: CHEST 1 VIEW COMPARISON:  05/10/2017 and prior radiograph FINDINGS: Cardiomegaly noted with slight decrease of interstitial pulmonary edema. There may be trace bilateral pleural effusions present. There is no evidence of focal airspace disease, suspicious pulmonary nodule/mass, or pneumothorax. No acute bony abnormalities are identified. IMPRESSION: Decreased interstitial pulmonary edema. Electronically Signed   By: Margarette Canada M.D.   On: 05/11/2017 07:12    ASSESSMENT AND PLAN:   Active Problems:   NSTEMI (non-ST elevated myocardial infarction) ([place])  #. Acute hypoxic respiratory failure due to Pulmonary edema with pleural effusion, secondary to Acute on chronic diastolic CHF - Intake/output, daily weight. Lasix. - Mednebs, O2 therapy as needed - Cardiology following - On IV Lasix. Monitor while being diuresed.  #. NSTEMI, history of coronary artery disease -  with telemetry monitoring, continuous pulse ox - Checked echo- EF 55% - Heparin drip given and now stopped -Continue Lopressor, nitroglycerin, Plavix, Crestor - Cardiology consult appreciated. - Cath done 05/09/17- double vessel  CAD, previous RCA stent is patent. Medical management advised  * Afib with RVR- improved On lopressor  #.Salmonella gastroenteritis On  antibiotics.  #. CKD3, Creatinine near baseline - Monitor BMP as patient had IV contrast - nephrologist on case. - stable after cath, now will be on lasix.  #. H/o Diabetes Accu-Cheks and insulin per ICU protocol  #. History of COPD -Continue Symbicort, duoneb.   #. History of hypertension -Continue Norvasc, Lopressor Clonidine stopped Lisinopril stopped due to acute kidney injury.  #. History of Barrett's esophagus/GERD -Continue Carafate, Protonix  #. History of depression -Continue Celexa  #. History of gout On allopurinol, colchicine  #. History of obstructive sleep apnea  All the records are reviewed and case discussed with Care Management/Social Workerr. Management plans discussed with the patient, family and they are in agreement.  CODE STATUS: Full.  TOTAL TIME TAKING CARE OF THIS PATIENT: 30 minutes.   Hillary Bow R M.D on 05/12/2017   Between 7am to 6pm - Pager - [phone]  After 6pm go to [email or web address] - password [email or web address] [facility]  Office  [phone]  CC: Primary care physician; Cletis Athens, MD  Note: This dictation was prepared with Dragon dictation along with smaller phrase technology. Any transcriptional errors that result from this process are unintentional.

## 2017-05-12 NOTE — Progress Notes (Signed)
Decreased to 40% and 45l on the HFNC.

## 2017-05-12 NOTE — Progress Notes (Signed)
Called [facility] to inform Dr Madalyn Rob of patients blood sugar level. MD will give [place] a call back. Ordered a blood glucose for lab to draw.

## 2017-05-12 NOTE — Progress Notes (Signed)
eLink Physician-Brief Progress Note Patient Name: Mike Decker DOB: 01-18-1941 MRN: [id number]   Date of Service  05/12/2017  HPI/Events of Note  Hyperglycemia - Blood glucose = 422. Already on Lantus 30 units [place] Q HS and Q 4 hour Resistant Novolog SSI. States that he takes Lantus 55 units [place] Q HS at home.   eICU Interventions  Will order: 1. Novolog 30 units [place] now. 2. Increase Lantus to 50 units [place] Q HS.     Intervention Category Major Interventions: Hyperglycemia - active titration of insulin therapy  Annalei Friesz Cornelia Copa 05/12/2017, 5:40 PM

## 2017-05-12 NOTE — Progress Notes (Signed)
Pt transferred from bed to chair on HFNC, O2 sats didn't drop. Tolerated well Pt sustained O2 through night on bipap/PRN Report given to [place].

## 2017-05-12 NOTE — Progress Notes (Addendum)
   Name: JAKSEN FIORELLA MRN: [id number] DOB: Aug 14, 1941    ADMISSION DATE:  05/06/2017  BRIEF PATIENT DESCRIPTION:  76 yo male admitted 05/28 with acute on chronic hypoxic respiratory failure secondary to AECOPD and pulmonary edema, NSTEMI, and abdominal pain with diarrhea.  Transferred from telemetry unit to ICU 05/29 with worsening acute respiratory failure requiring continuous Bipap and had a brief period of afib with RVR   SIGNIFICANT EVENTS  05/28-Pt admitted to the telemetry unit  05/29-Pt transferred to ICU  05/30- Increased CP with Afib/RVR, ST depressions 05/30-cardiac cath; chronic disease with a stent. Recommend medical management.  STUDIES:  CT Abd Pelvis and CT Angio Chest 05/28>>No definite evidence pulmonary embolus. Aortic atherosclerosis. Coronary calcifications are noted suggesting coronary artery disease. Bilateral pulmonary edema is noted with minimal pleural effusions. Moderate size fat containing right inguinal hernia. 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 Dilated gallbladder is noted without evidence of inflammation. No cholelithiasis is noted.   SUBJECTIVE: No complaints at this time, feels that his dyspnea is better.  VITAL SIGNS: Temp:  [98.5 F (36.9 C)-99 F (37.2 C)] 98.7 F (37.1 C) (06/03 0800) Pulse Rate:  [67-113] 76 (06/03 0800) Resp:  [15-28] 19 (06/03 0800) BP: (85-146)/(45-116) 140/70 (06/03 0800) SpO2:  [92 %-100 %] 95 % (06/03 0810) FiO2 (%):  [60 %-65 %] 60 % (06/03 0810)  PHYSICAL EXAMINATION: General: well developed, well nourished Caucasian male, NAD Neuro: alert and oriented, follows commands HEENT: supple, no JVD Cardiovascular: nsr, s1s2, no M/R/G Lungs: diminished throughout, even, non labored , scattered rhonchi Abdomen: +BS x4, soft, obese, non distended, non tender Musculoskeletal: normal bulk and tone, no edema  Skin: intact no rashes or lesions    Recent  Labs Lab 05/10/17 1037 05/11/17 0426 05/12/17 0426  NA 139 141 136  K 3.8 4.2 3.9  CL 108 107 101  CO2 22 23 25   BUN 35* 45* 61*  CREATININE 1.68* 1.82* 1.87*  GLUCOSE 217* 252* 230*    Recent Labs Lab 05/09/17 0549 05/11/17 0426 05/12/17 0426  HGB 9.9* 10.3* 10.1*  HCT 29.0* 30.2* 30.0*  WBC 8.5 9.3 16.7*  PLT 208 247 324   ASSESSMENT / PLAN: Acute on chronic hypoxic respiratory failure secondary pulmonary edema, I personally reviewed. Chest x-ray images6/3 personally reviewed, this continues to show pulmonary edema, minimally changed from previous. Afib with RVR-resolved  NSTEMI, Status post 5/31; patent stent. Acute on chronic renal failure Salmonella Gastroenteritis antibiotic course will be completed tomorrow. Hx: OSA (Bipap qhs), CKD Stage III, Diverticulosis, Barrett Esophagus, HTN, Hyperlipidemia, HTN, and MI  P: HFNC for now to maintain O2 sats 88% to 92% or for dyspnea wean as tolerated   BiPAP daily at bedtime and as needed.  We'll continue antibiotics to complete course for Salmonella enteritis. Add doxy for empiric aspiration coverage.  SSI and lantus qhs  Continue IV diuresis.    Marda Stalker, M.D.  05/12/2017

## 2017-05-12 NOTE — Progress Notes (Signed)
Called [facility] a second time. He will return my call as soon as he can.

## 2017-05-13 ENCOUNTER — Inpatient Hospital Stay: Payer: Medicare Other

## 2017-05-13 DIAGNOSIS — J9601 Acute respiratory failure with hypoxia: Secondary | ICD-10-CM

## 2017-05-13 LAB — MAGNESIUM: MAGNESIUM: 1.9 mg/dL (ref 1.7–2.4)

## 2017-05-13 LAB — CBC
HCT: 30.8 % — ABNORMAL LOW (ref 40.0–52.0)
HEMOGLOBIN: 10.4 g/dL — AB (ref 13.0–18.0)
MCH: 29.3 pg (ref 26.0–34.0)
MCHC: 33.8 g/dL (ref 32.0–36.0)
MCV: 86.6 fL (ref 80.0–100.0)
Platelets: 347 10*3/uL (ref 150–440)
RBC: 3.55 MIL/uL — ABNORMAL LOW (ref 4.40–5.90)
RDW: 14.6 % — ABNORMAL HIGH (ref 11.5–14.5)
WBC: 16.8 10*3/uL — AB (ref 3.8–10.6)

## 2017-05-13 LAB — GLUCOSE, CAPILLARY
GLUCOSE-CAPILLARY: 268 mg/dL — AB (ref 65–99)
GLUCOSE-CAPILLARY: 180 mg/dL — AB (ref 65–99)
GLUCOSE-CAPILLARY: 149 mg/dL — AB (ref 65–99)
GLUCOSE-CAPILLARY: 145 mg/dL — AB (ref 65–99)
GLUCOSE-CAPILLARY: 129 mg/dL — AB (ref 65–99)
GLUCOSE-CAPILLARY: 163 mg/dL — AB (ref 65–99)
GLUCOSE-CAPILLARY: 433 mg/dL — AB (ref 65–99)
Glucose-Capillary: 237 mg/dL — ABNORMAL HIGH (ref 65–99)
Glucose-Capillary: 187 mg/dL — ABNORMAL HIGH (ref 65–99)
Glucose-Capillary: 201 mg/dL — ABNORMAL HIGH (ref 65–99)
Glucose-Capillary: 285 mg/dL — ABNORMAL HIGH (ref 65–99)
Glucose-Capillary: 436 mg/dL — ABNORMAL HIGH (ref 65–99)

## 2017-05-13 LAB — BASIC METABOLIC PANEL
ANION GAP: 12 (ref 5–15)
BUN: 72 mg/dL — ABNORMAL HIGH (ref 6–20)
CALCIUM: 8.5 mg/dL — AB (ref 8.9–10.3)
CHLORIDE: 99 mmol/L — AB (ref 101–111)
CO2: 25 mmol/L (ref 22–32)
CREATININE: 2.03 mg/dL — AB (ref 0.61–1.24)
GFR calc non Af Amer: 30 mL/min — ABNORMAL LOW (ref >60)
GFR, EST AFRICAN AMERICAN: 35 mL/min — AB (ref >60)
Glucose, Bld: 164 mg/dL — ABNORMAL HIGH (ref 65–99)
Potassium: 3.5 mmol/L (ref 3.5–5.1)
SODIUM: 136 mmol/L (ref 135–145)

## 2017-05-13 LAB — PHOSPHORUS: PHOSPHORUS: 4.1 mg/dL (ref 2.5–4.6)

## 2017-05-13 MED ORDER — FUROSEMIDE 40 MG PO TABS
40.0000 mg | ORAL_TABLET | Freq: Every day | ORAL | Status: DC
  Administered 2017-05-14 – 2017-05-15 (×2): 40 mg via ORAL
  Filled 2017-05-13 (×2): qty 1

## 2017-05-13 MED ORDER — FUROSEMIDE 10 MG/ML IJ SOLN
40.0000 mg | Freq: Two times a day (BID) | INTRAMUSCULAR | Status: DC
  Administered 2017-05-13: 40 mg via INTRAVENOUS

## 2017-05-13 MED ORDER — POLYETHYLENE GLYCOL 3350 17 G PO PACK
17.0000 g | PACK | Freq: Every day | ORAL | Status: DC
  Administered 2017-05-13 – 2017-05-15 (×3): 17 g via ORAL
  Filled 2017-05-13 (×3): qty 1

## 2017-05-13 MED ORDER — INSULIN ASPART 100 UNIT/ML ~~LOC~~ SOLN
0.0000 [IU] | Freq: Three times a day (TID) | SUBCUTANEOUS | Status: DC
  Administered 2017-05-13: 8 [IU] via SUBCUTANEOUS
  Administered 2017-05-13: 15 [IU] via SUBCUTANEOUS
  Administered 2017-05-14: 5 [IU] via SUBCUTANEOUS
  Administered 2017-05-14: 8 [IU] via SUBCUTANEOUS
  Administered 2017-05-14: 5 [IU] via SUBCUTANEOUS
  Filled 2017-05-13: qty 8
  Filled 2017-05-13: qty 15
  Filled 2017-05-13: qty 5
  Filled 2017-05-13: qty 8
  Filled 2017-05-13: qty 5

## 2017-05-13 MED ORDER — INSULIN ASPART 100 UNIT/ML ~~LOC~~ SOLN
0.0000 [IU] | Freq: Every day | SUBCUTANEOUS | Status: DC
  Administered 2017-05-13: 5 [IU] via SUBCUTANEOUS
  Filled 2017-05-13: qty 5

## 2017-05-13 MED ORDER — CIPROFLOXACIN HCL 500 MG PO TABS
250.0000 mg | ORAL_TABLET | Freq: Two times a day (BID) | ORAL | Status: DC
  Administered 2017-05-13 – 2017-05-15 (×5): 250 mg via ORAL
  Filled 2017-05-13 (×6): qty 1

## 2017-05-13 MED ORDER — IPRATROPIUM-ALBUTEROL 0.5-2.5 (3) MG/3ML IN SOLN: 3.0000 mL | RESPIRATORY_TRACT | Status: DC | PRN

## 2017-05-13 NOTE — Progress Notes (Signed)
Spoke with Dr Leonidas Romberg regarding patient going into afib. MD will review/assess and place orders as needed.

## 2017-05-13 NOTE — Progress Notes (Signed)
Comfortable on nasal cannula oxygen A new complaints Denies chest pain  Vitals:   05/13/17 0900 05/13/17 0911 05/13/17 1000 05/13/17 1236  BP: (!) 131/94 (!) 131/94 (!) 137/56 (!) 142/63  Pulse: 75 74 68 74  Resp: 18  14 15   Temp:    98.1 F (36.7 C)  TempSrc:      SpO2: 99%  95% 96%  Weight:      Height:       No apparent distress HEENT WNL JVP not visualized No wheezes, diminished breath sounds, bibasilar crackles Regular, no murmurs Abdomen soft, bowel sounds present Extremities without edema No focal neurological deficit  BMP Latest Ref Rng & Units 05/13/2017 05/12/2017 05/12/2017  Glucose 65 - 99 mg/dL 164(H) 436(H) 230(H)  BUN 6 - 20 mg/dL 72(H) - 61(H)  Creatinine 0.61 - 1.24 mg/dL 2.03(H) - 1.87(H)  Sodium 135 - 145 mmol/L 136 - 136  Potassium 3.5 - 5.1 mmol/L 3.5 - 3.9  Chloride 101 - 111 mmol/L 99(L) - 101  CO2 22 - 32 mmol/L 25 - 25  Calcium 8.9 - 10.3 mg/dL 8.5(L) - 8.7(L)   CBC Latest Ref Rng & Units 05/13/2017 05/12/2017 05/11/2017  WBC 3.8 - 10.6 K/uL 16.8(H) 16.7(H) 9.3  Hemoglobin 13.0 - 18.0 g/dL 10.4(L) 10.1(L) 10.3(L)  Hematocrit 40.0 - 52.0 % 30.8(L) 30.0(L) 30.2(L)  Platelets 150 - 440 K/uL 347 324 247   CXR: Cardiomegaly, vascular redistribution, no acute infiltrates  IMPRESSION: Acute hypoxemic respiratory failure due to pulmonary edema, now improving Non-STEMI Salmonella enteritis AKI on CKD   PLAN/REC: Transfer to telemetry Continue diuresis as permitted by blood pressure and renal function Cardiology following Discussed with Dr. Darvin Neighbours Complete ciprofloxacin for Salmonella enteritis  After transfer, PCCM will sign off  Merton Border, MD PCCM service Mobile [phone] Pager [phone] 05/13/2017 1:08 PM

## 2017-05-13 NOTE — Progress Notes (Signed)
[facility] [place] NAME: Mike Decker    MR#:  [id number]  DATE OF BIRTH:  Apr 18, 1941  SUBJECTIVE:  CHIEF COMPLAINT:   Chief Complaint  Patient presents with  . Diarrhea  . Emesis    Cough.  Bipap at night Feels better Now on   REVIEW OF SYSTEMS:  CONSTITUTIONAL: No fever, fatigue or weakness.  EYES: No blurred or double vision.  EARS, NOSE, AND THROAT: No tinnitus or ear pain.  RESPIRATORY: No cough, positive for shortness of breath,no wheezing or hemoptysis.  CARDIOVASCULAR: No chest pain, orthopnea, edema.  GASTROINTESTINAL: No nausea, vomiting, diarrhea or abdominal pain.  GENITOURINARY: No dysuria, hematuria.  ENDOCRINE: No polyuria, nocturia,  HEMATOLOGY: No anemia, easy bruising or bleeding SKIN: No rash or lesion. MUSCULOSKELETAL: No joint pain or arthritis.   NEUROLOGIC: No tingling, numbness, weakness.  PSYCHIATRY: No anxiety or depression.   ROS  DRUG ALLERGIES:   Allergies  Allergen Reactions  . Peanut-Containing Drug Products Anaphylaxis  . Penicillins Hives and Rash    Has patient had a PCN reaction causing immediate rash, facial/tongue/throat swelling, SOB or lightheadedness with hypotension: Yes Has patient had a PCN reaction causing severe rash involving mucus membranes or skin necrosis: No Has patient had a PCN reaction that required hospitalization No Has patient had a PCN reaction occurring within the last 10 years: No If all of the above answers are "NO", then may proceed with Cephalosporin use.  . Bee Venom Swelling  . Influenza Vaccines Hives  . Inh [Isoniazid] Hives  . Kenalog [Triamcinolone Acetonide] Hives  . Nalfon [Fenoprofen Calcium] Hives    VITALS:  Blood pressure (!) 142/63, pulse 74, temperature 98.1 F (36.7 C), resp. rate 15, height 5\' 7"  (1.702 m), weight 86.2 kg (190 lb 0.6 oz), SpO2 96 %.  PHYSICAL EXAMINATION:  GENERAL:  76 y.o.-year-old patient lying in the bed . Looks  critically ill on high flow nasal cannula.  EYES: Pupils equal, round, reactive to light and accommodation. No scleral icterus. Extraocular muscles intact.  HEENT: Head atraumatic, normocephalic. Oropharynx and nasopharynx clear.  NECK:  Supple, no jugular venous distention. No thyroid enlargement, no tenderness.  LUNGS: ilateral crackles. CARDIOVASCULAR: S1, S2 normal. No murmurs, rubs, or gallops.  ABDOMEN: Soft, nontender, nondistended. Bowel sounds present. No organomegaly or mass.  EXTREMITIES: No pedal edema, cyanosis, or clubbing.  NEUROLOGIC: Cranial nerves II through XII are intact. Muscle strength 5/5 in all extremities. Sensation intact. Gait not checked.  PSYCHIATRIC: The patient is alert and oriented x 3.  SKIN: No obvious rash, lesion, or ulcer.   Physical Exam LABORATORY PANEL:   CBC  Recent Labs Lab 05/13/17 0518  WBC 16.8*  HGB 10.4*  HCT 30.8*  PLT 347   ------------------------------------------------------------------------------------------------------------------  Chemistries   Recent Labs Lab 05/06/17 1901  05/13/17 0518  NA 135  < > 136  K 3.5  < > 3.5  CL 101  < > 99*  CO2 22  < > 25  GLUCOSE 120*  < > 164*  BUN 37*  < > 72*  CREATININE 1.99*  < > 2.03*  CALCIUM 8.4*  < > 8.5*  MG  --   < > 1.9  AST 24  --   --   ALT 13*  --   --   ALKPHOS 69  --   --   BILITOT 0.7  --   --   < > = values in this interval not displayed. ------------------------------------------------------------------------------------------------------------------  Cardiac Enzymes  Recent Labs Lab 05/07/17 0950 05/07/17 1640  TROPONINI 21.51* 23.72*   ------------------------------------------------------------------------------------------------------------------  RADIOLOGY:  Dg Chest 1 View  Result Date: 05/12/2017 CLINICAL DATA:  Shortness of breath EXAM: CHEST 1 VIEW COMPARISON:  05/11/2017 and prior exams FINDINGS: Cardiomegaly, pulmonary vascular congestion  and very mild interstitial pulmonary edema again noted. There is no evidence of focal airspace disease, pulmonary edema, suspicious pulmonary nodule/mass, pleural effusion, or pneumothorax. No acute bony abnormalities are identified. IMPRESSION: Unchanged appearance of the chest with cardiomegaly and very mild interstitial pulmonary edema. Electronically Signed   By: Margarette Canada M.D.   On: 05/12/2017 06:58   Dg Chest Port 1 View  Result Date: 05/13/2017 CLINICAL DATA:  Acute respiratory failure, NSTEMI. EXAM: PORTABLE CHEST 1 VIEW COMPARISON:  Portable chest x-ray of May 12, 2017 FINDINGS: The lungs are adequately inflated. The interstitial markings are less conspicuous today. There is no alveolar infiltrate. The cardiac silhouette is enlarged. The pulmonary vascularity is less cephalized. No pleural effusion is observed. The mediastinum is normal in width. The bony thorax is unremarkable. IMPRESSION: Mild interval improvement in the appearance of the pulmonary interstitium consistent with decreased interstitial edema. Stable mild cardiomegaly. Electronically Signed   By: David  Martinique M.D.   On: 05/13/2017 07:06    ASSESSMENT AND PLAN:   Active Problems:   NSTEMI (non-ST elevated myocardial infarction) ([place])  #. Acute hypoxic respiratory failure due to Pulmonary edema with pleural effusion, secondary to Acute on chronic diastolic CHF - Intake/output, daily weight. Lasix. - nebs - Cardiology following - On IV Lasix. Monitor BUN/cr while diuresing Wean off Oxygen as possible. He is on [place] today from [place]  #. NSTEMI, history of coronary artery disease -  with telemetry monitoring, continuous pulse ox - Checked echo- EF 55% -Continue Lopressor, nitroglycerin, Plavix, Crestor - Cath 05/09/17- double vessel CAD, previous RCA stent is patent. Medical management advised. - Heparin drip stopped  * Afib with RVR- improved On lopressor  #.Salmonella gastroenteritis On ciprofloxacin.  #. CKD3,  Creatinine near baseline Mild worsening due to diuresis Decrease lasix dose #. H/o Diabetes Accu-Cheks and insulin per ICU protocol  #. History of COPD -Continue Symbicort, duoneb.   #. History of hypertension -Continue Norvasc, Lopressor Clonidine stopped Lisinopril stopped due to acute kidney injury.  #. History of Barrett's esophagus/GERD -Continue Carafate, Protonix  #. History of depression -Continue Celexa  #. History of gout On allopurinol, colchicine  #. History of obstructive sleep apnea  All the records are reviewed and case discussed with Care Management/Social Workerr. Management plans discussed with the patient, family and they are in agreement.  CODE STATUS: Full.  TOTAL TIME TAKING CARE OF THIS PATIENT: 30 minutes.   Hillary Bow R M.D on 05/13/2017   Between 7am to 6pm - Pager - [phone]  After 6pm go to [email or web address] - password [email or web address] [facility]  Office  [phone]  CC: Primary care physician; Cletis Athens, MD  Note: This dictation was prepared with Dragon dictation along with smaller phrase technology. Any transcriptional errors that result from this process are unintentional.

## 2017-05-13 NOTE — Progress Notes (Signed)
Paged Dr Clayborn Bigness. He was scrubbed in for a procedure. Eric answered and relayed to him that the patient has been going in and out of Afib. At this point no additonal orders. Current NSR 75.

## 2017-05-13 NOTE — Progress Notes (Signed)
Inpatient Diabetes Program Recommendations  AACE/ADA: New Consensus Statement on Inpatient Glycemic Control (2015)  Target Ranges:  Prepandial:   less than 140 mg/dL      Peak postprandial:   less than 180 mg/dL (1-2 hours)      Critically ill patients:  140 - 180 mg/dL   Results for LOTHAR, PREHN (MRN [id number]) as of 05/13/2017 08:34  Ref. Range 05/11/2017 23:49 05/12/2017 04:03 05/12/2017 08:03 05/12/2017 11:52 05/12/2017 15:56 05/12/2017 19:58 05/12/2017 23:13 05/13/2017 00:02  Glucose-Capillary Latest Ref Range: 65 - 99 mg/dL 280 (H) 211 (H) 234 (H) 379 (H) 422 (H) 444 (H) 409 (H) 310 (H)   Results for ABUBAKR, WIEMAN (MRN [id number]) as of 05/13/2017 08:34  Ref. Range 05/13/2017 01:05 05/13/2017 02:11 05/13/2017 03:31 05/13/2017 03:59 05/13/2017 05:06 05/13/2017 06:10 05/13/2017 06:58 05/13/2017 07:55  Glucose-Capillary Latest Ref Range: 65 - 99 mg/dL 268 (H) 237 (H) 201 (H) 187 (H) 180 (H) 149 (H) 145 (H) 129 (H)    Home DM Meds: Lantus 55 units QHS       Novolog 14 units with Breakfast       Novolog SSI with Lunch and Dinner  Current Insulin Orders: Lantus 50 units QHS      IV Insulin drip       -Note patient currently getting Solumedrol 40 mg BID.  Likely the cause of such elevated glucose levels.  -Note CBG up to 444 mg/dl last night at 8pm.  50 units Lantus given at 9 pm and then decision made to start patient on IV Insulin drip.  -CBGs have come down to normal with the combination of the IV Insulin drip plus the Lantus on board.     MD- Patient likely OK to transition back to SQ Insulin regimen.  May need more SQ insulin than home dose while getting IV steroids.  When you transition back to SQ Insulin today, please consider the following:  1. Increase Lantus to 55 units QHS (home dose).  No need to give Lantus prior to stopping drip this AM b/c patient received Lantus last night at 9pm so he has Lantus on board already.  2. Start Novolog Resistant SSI (0-20 units) TID AC + HS  3. Start  Novolog Meal Coverage: Novolog 6 units TID with meals (hold if pt eats <50% of meal)      --Will follow patient during hospitalization--  Wyn Quaker RN, MSN, CDE Diabetes Coordinator Inpatient Glycemic Control Team Team Pager: [phone] (8a-5p)

## 2017-05-13 NOTE — Progress Notes (Signed)
Called report to [place]. Patient has no complaints of pain or shortness of breath. He is on 3 liters of oxygen. Up to chair PRN. He is using urinal with one assist. Patient being tx to floor.

## 2017-05-13 NOTE — Progress Notes (Signed)
[place], [place] 05/13/17  Subjective:  Patient seen at bedside. Resting comfortably in bed at the moment. Has been exposed to intravenous contrast this admission. Creatinine currently 2.03. BUN a bit high at 72. Patient had Salmonella sepsis.   Objective:  Vital signs in last 24 hours:  Temp:  [97.1 F (36.2 C)-98.6 F (37 C)] 97.7 F (36.5 C) (06/04 0400) Pulse Rate:  [58-93] 74 (06/04 0911) Resp:  [9-27] 9 (06/04 0700) BP: (124-158)/(44-123) 131/94 (06/04 0911) SpO2:  [93 %-100 %] 99 % (06/04 0700) FiO2 (%):  [30 %-60 %] 30 % (06/04 0600)  Weight change:  Filed Weights   05/06/17 1857 05/07/17 0046 05/07/17 0615  Weight: 87.5 kg (193 lb) 86.6 kg (190 lb 14.4 oz) 86.2 kg (190 lb 0.6 oz)    Intake/Output:    Intake/Output Summary (Last 24 hours) at 05/13/17 1038 Last data filed at 05/13/17 6834  Gross per 24 hour  Intake          1264.03 ml  Output             2800 ml  Net         -1535.97 ml     Physical Exam: General: NAD,   HEENT HFNC O2  Neck supple  Pulm/lungs Mild basilar crackles   CVS/Heart Tachycardic,  no rub  Abdomen:  Soft, Non tender  Extremities: No peripheral edema  Neurologic: Alert, oriented  Skin: No rashes          Basic Metabolic Panel:   Recent Labs Lab 05/07/17 0356  05/08/17 0422 05/09/17 0549 05/10/17 1037 05/11/17 0426 05/12/17 0426 05/12/17 1640 05/13/17 0518  NA  --   < > 141 139 139 141 136  --  136  K  --   < > 3.5 3.4* 3.8 4.2 3.9  --  3.5  CL  --   < > 105 105 108 107 101  --  99*  CO2  --   < > 25 26 22 23 25   --  25  GLUCOSE  --   < > 184* 170* 217* 252* 230* 436* 164*  BUN  --   < > 49* 45* 35* 45* 61*  --  72*  CREATININE  --   < > 2.33* 1.99* 1.68* 1.82* 1.87*  --  2.03*  CALCIUM  --   < > 8.0* 7.9* 8.2* 8.5* 8.7*  --  8.5*  MG 1.7  --  1.9 1.9  --   --   --   --  1.9  PHOS  --   --   --   --   --   --   --   --  4.1  < > = values in this interval not  displayed.   CBC:  Recent Labs Lab 05/06/17 1901  05/08/17 0422 05/09/17 0549 05/11/17 0426 05/12/17 0426 05/13/17 0518  WBC 11.7*  < > 9.3 8.5 9.3 16.7* 16.8*  NEUTROABS 10.4*  --   --   --   --   --   --   HGB 12.2*  < > 10.4* 9.9* 10.3* 10.1* 10.4*  HCT 36.7*  < > 30.8* 29.0* 30.2* 30.0* 30.8*  MCV 89.9  < > 87.7 88.7 88.1 86.6 86.6  PLT 203  < > 184 208 247 324 347  < > = values in this interval not displayed.   No results found for: HEPBSAG, HEPBSAB, HEPBIGM    Microbiology:  Recent Results (  from the past 240 hour(s))  Blood Culture (routine x 2)     Status: None   Collection Time: 05/06/17  7:02 PM  Result Value Ref Range Status   Specimen Description BLOOD RIGHT HAND  Final   Special Requests   Final    BOTTLES DRAWN AEROBIC AND ANAEROBIC Blood Culture adequate volume   Culture NO GROWTH 5 DAYS  Final   Report Status 05/11/2017 FINAL  Final  Blood Culture (routine x 2)     Status: None   Collection Time: 05/06/17  7:02 PM  Result Value Ref Range Status   Specimen Description BLOOD LEFT AC  Final   Special Requests   Final    BOTTLES DRAWN AEROBIC AND ANAEROBIC Blood Culture adequate volume   Culture NO GROWTH 5 DAYS  Final   Report Status 05/11/2017 FINAL  Final  Urine culture     Status: None   Collection Time: 05/06/17  7:18 PM  Result Value Ref Range Status   Specimen Description URINE, RANDOM  Final   Special Requests NONE  Final   Culture   Final    NO GROWTH Performed at [facility], [place] [address], [place], [place] [address]    Report Status 05/08/2017 FINAL  Final  Gastrointestinal Panel by PCR , Stool     Status: Abnormal   Collection Time: 05/07/17  4:44 AM  Result Value Ref Range Status   Campylobacter species NOT DETECTED NOT DETECTED Final   Plesimonas shigelloides NOT DETECTED NOT DETECTED Final   Salmonella species DETECTED (A) NOT DETECTED Final    Comment: RESULT CALLED TO, READ BACK BY AND VERIFIED WITH: BETH BUONO 05/07/17  0642 ALV    Yersinia enterocolitica NOT DETECTED NOT DETECTED Final   Vibrio species NOT DETECTED NOT DETECTED Final   Vibrio cholerae NOT DETECTED NOT DETECTED Final   Enteroaggregative E coli (EAEC) NOT DETECTED NOT DETECTED Final   Enteropathogenic E coli (EPEC) NOT DETECTED NOT DETECTED Final   Enterotoxigenic E coli (ETEC) NOT DETECTED NOT DETECTED Final   Shiga like toxin producing E coli (STEC) NOT DETECTED NOT DETECTED Final   E. coli O157 NOT DETECTED NOT DETECTED Final   Shigella/Enteroinvasive E coli (EIEC) NOT DETECTED NOT DETECTED Final   Cryptosporidium NOT DETECTED NOT DETECTED Final   Cyclospora cayetanensis NOT DETECTED NOT DETECTED Final   Entamoeba histolytica NOT DETECTED NOT DETECTED Final   Giardia lamblia NOT DETECTED NOT DETECTED Final   Adenovirus F40/41 NOT DETECTED NOT DETECTED Final   Astrovirus NOT DETECTED NOT DETECTED Final   Norovirus GI/GII NOT DETECTED NOT DETECTED Final   Rotavirus A NOT DETECTED NOT DETECTED Final   Sapovirus (I, II, IV, and V) NOT DETECTED NOT DETECTED Final  C difficile quick scan w PCR reflex     Status: None   Collection Time: 05/07/17  4:44 AM  Result Value Ref Range Status   C Diff antigen NEGATIVE NEGATIVE Final   C Diff toxin NEGATIVE NEGATIVE Final   C Diff interpretation No C. difficile detected.  Final  MRSA PCR Screening     Status: None   Collection Time: 05/07/17  6:24 AM  Result Value Ref Range Status   MRSA by PCR NEGATIVE NEGATIVE Final    Comment:        The GeneXpert MRSA Assay (FDA approved for NASAL specimens only), is one component of a comprehensive MRSA colonization surveillance program. It is not intended to diagnose MRSA infection nor to guide or  monitor treatment for MRSA infections.     Coagulation Studies: No results for input(s): LABPROT, INR in the last 72 hours.  Urinalysis: No results for input(s): COLORURINE, LABSPEC, PHURINE, GLUCOSEU, HGBUR, BILIRUBINUR, KETONESUR, PROTEINUR,  UROBILINOGEN, NITRITE, LEUKOCYTESUR in the last 72 hours.  Invalid input(s): APPERANCEUR    Imaging: Dg Chest 1 View  Result Date: 05/12/2017 CLINICAL DATA:  Shortness of breath EXAM: CHEST 1 VIEW COMPARISON:  05/11/2017 and prior exams FINDINGS: Cardiomegaly, pulmonary vascular congestion and very mild interstitial pulmonary edema again noted. There is no evidence of focal airspace disease, pulmonary edema, suspicious pulmonary nodule/mass, pleural effusion, or pneumothorax. No acute bony abnormalities are identified. IMPRESSION: Unchanged appearance of the chest with cardiomegaly and very mild interstitial pulmonary edema. Electronically Signed   By: Margarette Canada M.D.   On: 05/12/2017 06:58   Dg Chest Port 1 View  Result Date: 05/13/2017 CLINICAL DATA:  Acute respiratory failure, NSTEMI. EXAM: PORTABLE CHEST 1 VIEW COMPARISON:  Portable chest x-ray of May 12, 2017 FINDINGS: The lungs are adequately inflated. The interstitial markings are less conspicuous today. There is no alveolar infiltrate. The cardiac silhouette is enlarged. The pulmonary vascularity is less cephalized. No pleural effusion is observed. The mediastinum is normal in width. The bony thorax is unremarkable. IMPRESSION: Mild interval improvement in the appearance of the pulmonary interstitium consistent with decreased interstitial edema. Stable mild cardiomegaly. Electronically Signed   By: David  Martinique M.D.   On: 05/13/2017 07:06     Medications:    . allopurinol  200 mg Oral BID  . amLODipine  5 mg Oral BID  . budesonide (PULMICORT) nebulizer solution  0.5 mg Nebulization BID  . chlorhexidine  15 mL Mouth Rinse BID  . citalopram  10 mg Oral Daily  . clopidogrel  75 mg Oral Daily  . enoxaparin (LOVENOX) injection  40 mg Subcutaneous Q24H  . furosemide  40 mg Intravenous Q12H  . insulin aspart  0-15 Units Subcutaneous TID WC  . insulin aspart  0-5 Units Subcutaneous QHS  . insulin glargine  50 Units Subcutaneous QHS  .  loratadine  10 mg Oral Daily  . metoprolol tartrate  50 mg Oral QID  . pantoprazole  40 mg Oral BID  . rosuvastatin  40 mg Oral Daily  . sodium chloride flush  3 mL Intravenous Q12H  . sucralfate  1 g Oral TID WC & HS  . Vitamin D (Ergocalciferol)  50,000 Units Oral Q30 days   acetaminophen, chlorpheniramine-HYDROcodone, dextrose, guaiFENesin **AND** [DISCONTINUED] dextromethorphan, ipratropium-albuterol, morphine injection, nitroGLYCERIN, ondansetron (ZOFRAN) IV  Assessment/ Plan:  76 y.o. 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 ARF on CKD st 3 -  Creatinine slightly higher today at 2.03. Good urine output at 3.4 L over the preceding 24 hours. We will start him on 0.9 normal saline at 50 cc per hour and given uptrending BUN and creatinine.  2. SOB - Decrease Lasix to 40 mg by mouth daily.  3. DM-2 with CKD - Baseline Cr 1.7/GFR 38 as outpatient - HbA1c 8.2 % (05/02/17)  4. Acute gastroenteritis - Salmonella detected - C diff negative - Not on antibiotic therapy at the moment.  5. Acute NSTEMI - Mgmt as per ICU team/Cardiology. - coronary angiography 5/31- medical management recommended    LOS: 7 Safiyyah Vasconez 6/4/201810:38 [place], [place]

## 2017-05-13 NOTE — Progress Notes (Signed)
MD made aware of FSBS of 436

## 2017-05-13 NOTE — Care Management (Signed)
Patient plan for floor care transfer today out of ICU. He is on 3L/[place] at this time. He is on Plavix and Lovenox also at this time.

## 2017-05-14 LAB — GLUCOSE, CAPILLARY
GLUCOSE-CAPILLARY: 246 mg/dL — AB (ref 65–99)
GLUCOSE-CAPILLARY: 218 mg/dL — AB (ref 65–99)
Glucose-Capillary: 262 mg/dL — ABNORMAL HIGH (ref 65–99)
Glucose-Capillary: 197 mg/dL — ABNORMAL HIGH (ref 65–99)

## 2017-05-14 LAB — BASIC METABOLIC PANEL
ANION GAP: 9 (ref 5–15)
BUN: 70 mg/dL — ABNORMAL HIGH (ref 6–20)
CALCIUM: 8.6 mg/dL — AB (ref 8.9–10.3)
CO2: 27 mmol/L (ref 22–32)
Chloride: 96 mmol/L — ABNORMAL LOW (ref 101–111)
Creatinine, Ser: 1.87 mg/dL — ABNORMAL HIGH (ref 0.61–1.24)
GFR, EST AFRICAN AMERICAN: 38 mL/min — AB (ref >60)
GFR, EST NON AFRICAN AMERICAN: 33 mL/min — AB (ref >60)
GLUCOSE: 313 mg/dL — AB (ref 65–99)
Potassium: 3.7 mmol/L (ref 3.5–5.1)
Sodium: 132 mmol/L — ABNORMAL LOW (ref 135–145)

## 2017-05-14 MED ORDER — INSULIN GLARGINE 100 UNIT/ML ~~LOC~~ SOLN
55.0000 [IU] | Freq: Every day | SUBCUTANEOUS | Status: DC
  Administered 2017-05-14: 55 [IU] via SUBCUTANEOUS
  Filled 2017-05-14 (×2): qty 0.55

## 2017-05-14 MED ORDER — ASPIRIN EC 81 MG PO TBEC
81.0000 mg | DELAYED_RELEASE_TABLET | Freq: Every day | ORAL | Status: DC
  Administered 2017-05-14 – 2017-05-15 (×2): 81 mg via ORAL
  Filled 2017-05-14 (×2): qty 1

## 2017-05-14 MED ORDER — INSULIN ASPART 100 UNIT/ML ~~LOC~~ SOLN
6.0000 [IU] | Freq: Three times a day (TID) | SUBCUTANEOUS | Status: DC
  Administered 2017-05-14 – 2017-05-15 (×4): 6 [IU] via SUBCUTANEOUS
  Filled 2017-05-14 (×4): qty 6

## 2017-05-14 MED ORDER — BISACODYL 5 MG PO TBEC
10.0000 mg | DELAYED_RELEASE_TABLET | Freq: Once | ORAL | Status: AC
  Administered 2017-05-14: 10 mg via ORAL
  Filled 2017-05-14: qty 2

## 2017-05-14 MED ORDER — LISINOPRIL 5 MG PO TABS
5.0000 mg | ORAL_TABLET | Freq: Every day | ORAL | Status: DC
  Administered 2017-05-14 – 2017-05-15 (×2): 5 mg via ORAL
  Filled 2017-05-14 (×2): qty 1

## 2017-05-14 NOTE — Plan of Care (Signed)
Problem: Pain Managment: Goal: General experience of comfort will improve Outcome: Progressing Prn medications  Problem: Physical Regulation: Goal: Ability to maintain clinical measurements within normal limits will improve Outcome: Not Progressing PT evaluation ordered for today  Problem: Tissue Perfusion: Goal: Risk factors for ineffective tissue perfusion will decrease Outcome: Progressing SQ Lovenox  Problem: Bowel/Gastric: Goal: Will not experience complications related to bowel motility Outcome: Not Progressing No BM for a week, medications given to facilitate  Problem: Cardiac: Goal: Vascular access site(s) Level 0-1 will be maintained Outcome: Completed/Met Date Met: 05/14/17 Rt groin cath sited benign

## 2017-05-14 NOTE — Progress Notes (Signed)
Inpatient Diabetes Program Recommendations  AACE/ADA: New Consensus Statement on Inpatient Glycemic Control (2015)  Target Ranges:  Prepandial:   less than 140 mg/dL      Peak postprandial:   less than 180 mg/dL (1-2 hours)      Critically ill patients:  140 - 180 mg/dL   Results for Mike Decker, Mike Decker (MRN [id number]) as of 05/14/2017 06:59  Ref. Range 05/13/2017 07:55 05/13/2017 09:05 05/13/2017 11:14 05/13/2017 16:34 05/13/2017 20:30  Glucose-Capillary Latest Ref Range: 65 - 99 mg/dL 129 (H) 163 (H) 285 (H) 436 (H) 433 (H)   Results for Mike Decker, Mike Decker (MRN [id number]) as of 05/14/2017 06:59  Ref. Range 05/14/2017 04:19  Glucose Latest Ref Range: 65 - 99 mg/dL 313 (H)     Home DM Meds: Lantus 55 units QHS                             Novolog 14 units with Breakfast                             Novolog SSI with Lunch and Dinner  Current Insulin Orders: Lantus 50 units QHS      Novolog Moderate Correction Scale/ SSI (0-15 units) TID AC + HS      MD- Note Solumedrol stopped.  Last dose given yesterday at 5am.  Patient was transitioned off IV Insulin drip yesterday morning.  Note afternoon CBGs severely elevated.  AM CBG today also elevated to 313 mg/dl.  Please consider the following:  1. Increase Lantus to 55 units QHS (home dose)  2. Increase Novolog SSI to Resistant SSI (0-20 units) TID AC + HS  3. Start Novolog Meal Coverage: Novolog 6 units TID with meals (hold if pt eats <50% of meal)     --Will follow patient during hospitalization--  Wyn Quaker RN, MSN, CDE Diabetes Coordinator Inpatient Glycemic Control Team Team Pager: [phone] (8a-5p)

## 2017-05-14 NOTE — Progress Notes (Signed)
[place] [facility] [place] NAME: Mike Decker    MR#:  [id number]  DATE OF BIRTH:  1941/04/13  SUBJECTIVE:  CHIEF COMPLAINT:   Chief Complaint  Patient presents with  . Diarrhea  . Emesis   Cough. Some blood in sputum. On RA today  REVIEW OF SYSTEMS:  CONSTITUTIONAL: No fever, fatigue or weakness.  EYES: No blurred or double vision.  EARS, NOSE, AND THROAT: No tinnitus or ear pain.  RESPIRATORY: No cough, positive for shortness of breath,no wheezing or hemoptysis.  CARDIOVASCULAR: No chest pain, orthopnea, edema.  GASTROINTESTINAL: No nausea, vomiting, diarrhea or abdominal pain.  GENITOURINARY: No dysuria, hematuria.  ENDOCRINE: No polyuria, nocturia,  HEMATOLOGY: No anemia, easy bruising or bleeding SKIN: No rash or lesion. MUSCULOSKELETAL: No joint pain or arthritis.   NEUROLOGIC: No tingling, numbness, weakness.  PSYCHIATRY: No anxiety or depression.   ROS  DRUG ALLERGIES:   Allergies  Allergen Reactions  . Peanut-Containing Drug Products Anaphylaxis  . Penicillins Hives and Rash    Has patient had a PCN reaction causing immediate rash, facial/tongue/throat swelling, SOB or lightheadedness with hypotension: Yes Has patient had a PCN reaction causing severe rash involving mucus membranes or skin necrosis: No Has patient had a PCN reaction that required hospitalization No Has patient had a PCN reaction occurring within the last 10 years: No If all of the above answers are "NO", then may proceed with Cephalosporin use.  . Bee Venom Swelling  . Influenza Vaccines Hives  . Inh [Isoniazid] Hives  . Kenalog [Triamcinolone Acetonide] Hives  . Nalfon [Fenoprofen Calcium] Hives    VITALS:  Blood pressure (!) 136/51, pulse 64, temperature 98.1 F (36.7 C), resp. rate 18, height 5\' 7"  (1.702 m), weight 86.2 kg (190 lb 0.6 oz), SpO2 94 %.  PHYSICAL EXAMINATION:  GENERAL:  76 y.o.-year-old patient lying in the bed. EYES: Pupils  equal, round, reactive to light and accommodation. No scleral icterus. Extraocular muscles intact.  HEENT: Head atraumatic, normocephalic. Oropharynx and nasopharynx clear.  NECK:  Supple, no jugular venous distention. No thyroid enlargement, no tenderness.  LUNGS: ilateral crackles. CARDIOVASCULAR: S1, S2 normal. No murmurs, rubs, or gallops.  ABDOMEN: Soft, nontender, nondistended. Bowel sounds present. No organomegaly or mass.  EXTREMITIES: No pedal edema, cyanosis, or clubbing.  NEUROLOGIC: Cranial nerves II through XII are intact. Muscle strength 5/5 in all extremities. Sensation intact.  PSYCHIATRIC: The patient is alert and oriented x 3.  SKIN: No obvious rash, lesion, or ulcer.   Physical Exam LABORATORY PANEL:   CBC  Recent Labs Lab 05/13/17 0518  WBC 16.8*  HGB 10.4*  HCT 30.8*  PLT 347   ------------------------------------------------------------------------------------------------------------------  Chemistries   Recent Labs Lab 05/13/17 0518 05/14/17 0419  NA 136 132*  K 3.5 3.7  CL 99* 96*  CO2 25 27  GLUCOSE 164* 313*  BUN 72* 70*  CREATININE 2.03* 1.87*  CALCIUM 8.5* 8.6*  MG 1.9  --    ------------------------------------------------------------------------------------------------------------------  Cardiac Enzymes  Recent Labs Lab 05/07/17 1640  TROPONINI 23.72*   ------------------------------------------------------------------------------------------------------------------  RADIOLOGY:  Dg Chest Port 1 View  Result Date: 05/13/2017 CLINICAL DATA:  Acute respiratory failure, NSTEMI. EXAM: PORTABLE CHEST 1 VIEW COMPARISON:  Portable chest x-ray of May 12, 2017 FINDINGS: The lungs are adequately inflated. The interstitial markings are less conspicuous today. There is no alveolar infiltrate. The cardiac silhouette is enlarged. The pulmonary vascularity is less cephalized. No pleural effusion is observed. The mediastinum is normal in width.  The  bony thorax is unremarkable. IMPRESSION: Mild interval improvement in the appearance of the pulmonary interstitium consistent with decreased interstitial edema. Stable mild cardiomegaly. Electronically Signed   By: David  Martinique M.D.   On: 05/13/2017 07:06    ASSESSMENT AND PLAN:   Active Problems:   NSTEMI (non-ST elevated myocardial infarction) ([place])  # Mild hemoptysis likely from cough. On ASA and plavix Monitor  #. Acute hypoxic respiratory failure due to Pulmonary edema with pleural effusion, secondary to Acute on chronic diastolic CHF - Intake/output, daily weight. Lasix. - nebs - Cardiology has seen the patient  #. NSTEMI, history of coronary artery disease -  with telemetry monitoring, continuous pulse ox - Checked echo- EF 55% -Continue Lopressor, nitroglycerin, Plavix, Crestor. - Cath 05/09/17- double vessel CAD, previous RCA stent is patent. Medical management advised. - Heparin drip stopped  * Afib with RVR- improved On lopressor  #.Salmonella gastroenteritis On ciprofloxacin.  #. CKD3, Creatinine near baseline Mild worsening due to diuresis has improved Decreased lasix dose  #. H/o Diabetes Back on lantus 55 units QHS and pre-meal novolog and SSI  #. History of COPD -Continue Symbicort, duoneb.   #. History of hypertension -Continue Norvasc, Lopressor Clonidine stopped Lisinopril stopped due to acute kidney injury.  #. History of Barrett's esophagus/GERD -Continue Carafate, Protonix  #. History of depression -Continue Celexa  #. History of gout On allopurinol, colchicine  # History of obstructive sleep apnea  All the records are reviewed and case discussed with Care Management/Social Workerr. Management plans discussed with the patient, family and they are in agreement.  CODE STATUS: Full.  TOTAL TIME TAKING CARE OF THIS PATIENT: 30 minutes.   Hillary Bow R M.D on 05/14/2017   Between 7am to 6pm - Pager - [phone]  After 6pm go  to [email or web address] - password [email or web address] [facility]  Office  [phone]  CC: Primary care physician; Cletis Athens, MD  Note: This dictation was prepared with Dragon dictation along with smaller phrase technology. Any transcriptional errors that result from this process are unintentional.

## 2017-05-14 NOTE — Progress Notes (Signed)
[facility], [place] 05/14/17  Subjective:  Renal function has improved today. Creatinine down to 1.87. Patient appears to be in good spirits. Good urine output noted.   Objective:  Vital signs in last 24 hours:  Temp:  [98 F (36.7 C)-98.3 F (36.8 C)] 98.1 F (36.7 C) (06/05 1113) Pulse Rate:  [57-74] 64 (06/05 1113) Resp:  [15-18] 18 (06/05 1113) BP: (133-153)/(48-67) 136/51 (06/05 1113) SpO2:  [94 %-98 %] 94 % (06/05 1113)  Weight change:  Filed Weights   05/06/17 1857 05/07/17 0046 05/07/17 0615  Weight: 87.5 kg (193 lb) 86.6 kg (190 lb 14.4 oz) 86.2 kg (190 lb 0.6 oz)    Intake/Output:    Intake/Output Summary (Last 24 hours) at 05/14/17 1221 Last data filed at 05/14/17 1159  Gross per 24 hour  Intake              723 ml  Output             2150 ml  Net            -1427 ml     Physical Exam: General: NAD,  HEENT [place]/AT EOMI hearing intact OM moist  Neck supple  Pulm/lungs Mild basilar crackles, normal effort  CVS/Heart S1S2 no rubs  Abdomen:  Soft, Non tender  Extremities: No peripheral edema  Neurologic: Alert, oriented  Skin: No rashes          Basic Metabolic Panel:   Recent Labs Lab 05/08/17 0422 05/09/17 0549 05/10/17 1037 05/11/17 0426 05/12/17 0426 05/12/17 1640 05/13/17 0518 05/14/17 0419  NA 141 139 139 141 136  --  136 132*  K 3.5 3.4* 3.8 4.2 3.9  --  3.5 3.7  CL 105 105 108 107 101  --  99* 96*  CO2 25 26 22 23 25   --  25 27  GLUCOSE 184* 170* 217* 252* 230* 436* 164* 313*  BUN 49* 45* 35* 45* 61*  --  72* 70*  CREATININE 2.33* 1.99* 1.68* 1.82* 1.87*  --  2.03* 1.87*  CALCIUM 8.0* 7.9* 8.2* 8.5* 8.7*  --  8.5* 8.6*  MG 1.9 1.9  --   --   --   --  1.9  --   PHOS  --   --   --   --   --   --  4.1  --      CBC:  Recent Labs Lab 05/08/17 0422 05/09/17 0549 05/11/17 0426 05/12/17 0426 05/13/17 0518  WBC 9.3 8.5 9.3 16.7* 16.8*  HGB 10.4* 9.9* 10.3* 10.1* 10.4*  HCT 30.8* 29.0* 30.2*  30.0* 30.8*  MCV 87.7 88.7 88.1 86.6 86.6  PLT 184 208 247 324 347     No results found for: HEPBSAG, HEPBSAB, HEPBIGM    Microbiology:  Recent Results (from the past 240 hour(s))  Blood Culture (routine x 2)     Status: None   Collection Time: 05/06/17  7:02 PM  Result Value Ref Range Status   Specimen Description BLOOD RIGHT HAND  Final   Special Requests   Final    BOTTLES DRAWN AEROBIC AND ANAEROBIC Blood Culture adequate volume   Culture NO GROWTH 5 DAYS  Final   Report Status 05/11/2017 FINAL  Final  Blood Culture (routine x 2)     Status: None   Collection Time: 05/06/17  7:02 PM  Result Value Ref Range Status   Specimen Description BLOOD LEFT [facility]  Final   Special Requests  Final    BOTTLES DRAWN AEROBIC AND ANAEROBIC Blood Culture adequate volume   Culture NO GROWTH 5 DAYS  Final   Report Status 05/11/2017 FINAL  Final  Urine culture     Status: None   Collection Time: 05/06/17  7:18 PM  Result Value Ref Range Status   Specimen Description URINE, RANDOM  Final   Special Requests NONE  Final   Culture   Final    NO GROWTH Performed at [facility], [place] [address], [place], [place] [address]    Report Status 05/08/2017 FINAL  Final  Gastrointestinal Panel by PCR , Stool     Status: Abnormal   Collection Time: 05/07/17  4:44 AM  Result Value Ref Range Status   Campylobacter species NOT DETECTED NOT DETECTED Final   Plesimonas shigelloides NOT DETECTED NOT DETECTED Final   Salmonella species DETECTED (A) NOT DETECTED Final    Comment: RESULT CALLED TO, READ BACK BY AND VERIFIED WITH: BETH BUONO 05/07/17 0642 ALV    Yersinia enterocolitica NOT DETECTED NOT DETECTED Final   Vibrio species NOT DETECTED NOT DETECTED Final   Vibrio cholerae NOT DETECTED NOT DETECTED Final   Enteroaggregative E coli (EAEC) NOT DETECTED NOT DETECTED Final   Enteropathogenic E coli (EPEC) NOT DETECTED NOT DETECTED Final   Enterotoxigenic E coli (ETEC) NOT DETECTED NOT DETECTED  Final   Shiga like toxin producing E coli (STEC) NOT DETECTED NOT DETECTED Final   E. coli O157 NOT DETECTED NOT DETECTED Final   Shigella/Enteroinvasive E coli (EIEC) NOT DETECTED NOT DETECTED Final   Cryptosporidium NOT DETECTED NOT DETECTED Final   Cyclospora cayetanensis NOT DETECTED NOT DETECTED Final   Entamoeba histolytica NOT DETECTED NOT DETECTED Final   Giardia lamblia NOT DETECTED NOT DETECTED Final   Adenovirus F40/41 NOT DETECTED NOT DETECTED Final   Astrovirus NOT DETECTED NOT DETECTED Final   Norovirus GI/GII NOT DETECTED NOT DETECTED Final   Rotavirus A NOT DETECTED NOT DETECTED Final   Sapovirus (I, II, IV, and V) NOT DETECTED NOT DETECTED Final  C difficile quick scan w PCR reflex     Status: None   Collection Time: 05/07/17  4:44 AM  Result Value Ref Range Status   C Diff antigen NEGATIVE NEGATIVE Final   C Diff toxin NEGATIVE NEGATIVE Final   C Diff interpretation No C. difficile detected.  Final  MRSA PCR Screening     Status: None   Collection Time: 05/07/17  6:24 AM  Result Value Ref Range Status   MRSA by PCR NEGATIVE NEGATIVE Final    Comment:        The GeneXpert MRSA Assay (FDA approved for NASAL specimens only), is one component of a comprehensive MRSA colonization surveillance program. It is not intended to diagnose MRSA infection nor to guide or monitor treatment for MRSA infections.     Coagulation Studies: No results for input(s): LABPROT, INR in the last 72 hours.  Urinalysis: No results for input(s): COLORURINE, LABSPEC, PHURINE, GLUCOSEU, HGBUR, BILIRUBINUR, KETONESUR, PROTEINUR, UROBILINOGEN, NITRITE, LEUKOCYTESUR in the last 72 hours.  Invalid input(s): APPERANCEUR    Imaging: Dg Chest Port 1 View  Result Date: 05/13/2017 CLINICAL DATA:  Acute respiratory failure, NSTEMI. EXAM: PORTABLE CHEST 1 VIEW COMPARISON:  Portable chest x-ray of May 12, 2017 FINDINGS: The lungs are adequately inflated. The interstitial markings are less  conspicuous today. There is no alveolar infiltrate. The cardiac silhouette is enlarged. The pulmonary vascularity is less cephalized. No pleural effusion is observed.  The mediastinum is normal in width. The bony thorax is unremarkable. IMPRESSION: Mild interval improvement in the appearance of the pulmonary interstitium consistent with decreased interstitial edema. Stable mild cardiomegaly. Electronically Signed   By: David  Martinique M.D.   On: 05/13/2017 07:06     Medications:    . allopurinol  200 mg Oral BID  . amLODipine  5 mg Oral BID  . budesonide (PULMICORT) nebulizer solution  0.5 mg Nebulization BID  . chlorhexidine  15 mL Mouth Rinse BID  . ciprofloxacin  250 mg Oral BID  . citalopram  10 mg Oral Daily  . clopidogrel  75 mg Oral Daily  . enoxaparin (LOVENOX) injection  40 mg Subcutaneous Q24H  . furosemide  40 mg Oral Daily  . insulin aspart  0-15 Units Subcutaneous TID WC  . insulin aspart  0-5 Units Subcutaneous QHS  . insulin aspart  6 Units Subcutaneous TID WC  . insulin glargine  50 Units Subcutaneous QHS  . loratadine  10 mg Oral Daily  . metoprolol tartrate  50 mg Oral QID  . pantoprazole  40 mg Oral BID  . polyethylene glycol  17 g Oral Daily  . rosuvastatin  40 mg Oral Daily  . sodium chloride flush  3 mL Intravenous Q12H  . sucralfate  1 g Oral TID WC & HS  . Vitamin D (Ergocalciferol)  50,000 Units Oral Q30 days   acetaminophen, chlorpheniramine-HYDROcodone, dextrose, guaiFENesin **AND** [DISCONTINUED] dextromethorphan, ipratropium-albuterol, morphine injection, nitroGLYCERIN, ondansetron (ZOFRAN) IV  Assessment/ Plan:  76 y.o. 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 ARF on CKD st 3 -  Renal function has improved. Creatinine down to 1.87 at the moment. Continue to monitor renal function while the patient remains here.  2. SOB - Continue Lasix 40 mg by mouth daily.  3. DM-2  with CKD - Baseline Cr 1.7/GFR 38 as outpatient - HbA1c 8.2 % (05/02/17)  4. Acute gastroenteritis - Salmonella detected - C diff negative - Not on antibiotic therapy at the moment, patient was on Cipro earlier.  5. Acute NSTEMI - Mgmt as per ICU team/Cardiology. - coronary angiography 5/31- medical management recommended    LOS: [place], Lindey Renzulli 6/5/201812:21 PM  [place], [place]

## 2017-05-14 NOTE — Progress Notes (Signed)
Pt coughed up a dime size plug of bloody mucous, page out to Dr. Darvin Neighbours.

## 2017-05-14 NOTE — Plan of Care (Signed)
Problem: Pain Managment: Goal: General experience of comfort will improve Outcome: Progressing No voiced complaints of pain.  Tolerated Bipap while sleeping.

## 2017-05-14 NOTE — Evaluation (Signed)
Physical Therapy Evaluation Patient Details Name: Mike Decker MRN: [id number] DOB: 23-Jan-1941 Today's Date: 05/14/2017   History of Present Illness  76 y/o male here with nausea and vomiting found to have had an NSTEMI.  He was in CCU 1 week needing supplemental O2, transfered to floor day of PT exam.  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  He did have some fatigue with the effort on room air (O2 in on high 90s on arrival and initially with ambulation, did drop to low 90s with prolonged ambulation).  Overall pt feels safe to go home, would benefit from HHPT as he is not at his baseline.     Follow Up Recommendations Home health PT    Equipment Recommendations       Recommendations for Other Services       Precautions / Restrictions Precautions Precautions:  (mod fall) Restrictions Weight Bearing Restrictions: No      Mobility  Bed Mobility               General bed mobility comments: not tested, pt in recliner on arrival, not tested  Transfers Overall transfer level: Modified independent Equipment used: Rolling walker (2 wheeled)             General transfer comment: Pt able to rise w/o assist, was safe, but did have decreased confidence.  Pt normally does not need AD, but showed some unsteadiness/need for UE support.  Ambulation/Gait Ambulation/Gait assistance: Min guard Ambulation Distance (Feet): 200 Feet Assistive device: Rolling walker (2 wheeled);None       General Gait Details: Pt able to walk ~125 ft with FWW, ~50 ft holding rail and ~25 ft w/o UE support. He was able to do all these w/o direct physical assist but  had increased lateral lean and general feelings of unsteadiness with decreased support.  Overall pt well but is not at his baseline and was clearly much safer with AD.  Stairs            Wheelchair Mobility    Modified Rankin (Stroke Patients Only)       Balance Overall balance assessment: Modified Independent                                           Pertinent Vitals/Pain Pain Assessment: No/denies pain    Home Living Family/patient expects to be discharged to:: Private residence Living Arrangements: Spouse/significant other;Children Available Help at Discharge: Family Type of Home: House Home Access: Stairs to enter Entrance Stairs-Rails: Can reach both Entrance Stairs-Number of Steps: 5   Home Equipment: Walker - 4 wheels      Prior Function Level of Independence: Independent         Comments: Pt reports he hasn't driven since CVA last year, but generally has been able to be active, get out of the house, etc w/o AD     Hand Dominance        Extremity/Trunk Assessment   Upper Extremity Assessment Upper Extremity Assessment: Overall WFL for tasks assessed    Lower Extremity Assessment Lower Extremity Assessment: Overall WFL for tasks assessed  Communication   Communication: No difficulties  Cognition Arousal/Alertness: Awake/alert Behavior During Therapy: WFL for tasks assessed/performed Overall Cognitive Status: Within Functional Limits for tasks assessed                                        General Comments      Exercises     Assessment/Plan    PT Assessment Patient needs continued PT services  PT Problem List Decreased activity tolerance;Decreased balance;Cardiopulmonary status limiting activity;Decreased safety awareness;Decreased coordination;Decreased mobility;Decreased knowledge of use of DME       PT Treatment Interventions DME instruction;Gait training;Stair training;Functional mobility  training;Therapeutic activities;Therapeutic exercise;Balance training;Neuromuscular re-education;Patient/family education    PT Goals (Current goals can be found in the Care Plan section)  Acute Rehab PT Goals Patient Stated Goal: go home PT Goal Formulation: With patient Time For Goal Achievement: 05/28/17 Potential to Achieve Goals: Good    Frequency Min 2X/week   Barriers to discharge        Co-evaluation               AM-PAC PT "6 Clicks" Daily Activity  Outcome Measure Difficulty turning over in bed (including adjusting bedclothes, sheets and blankets)?: None Difficulty moving from lying on back to sitting on the side of the bed? : None Difficulty sitting down on and standing up from a chair with arms (e.g., wheelchair, bedside commode, etc,.)?: None Help needed moving to and from a bed to chair (including a wheelchair)?: None Help needed walking in hospital room?: A Little Help needed climbing 3-5 steps with a railing? : A Little 6 Click Score: 22    End of Session Equipment Utilized During Treatment: Gait belt Activity Tolerance: Patient tolerated treatment well;Patient limited by fatigue     PT Visit Diagnosis: Muscle weakness (generalized) (M62.81);Difficulty in walking, not elsewhere classified (R26.2)    Time: 9532-0233 PT Time Calculation (min) (ACUTE ONLY): 17 min   Charges:   PT Evaluation $PT Eval Low Complexity: 1 Procedure     PT G Codes:        Kreg Shropshire, DPT 05/14/2017, 3:30 PM

## 2017-05-14 NOTE — Progress Notes (Signed)
No new orders received from Dr. Darvin Neighbours

## 2017-05-14 NOTE — Plan of Care (Signed)
Problem: Bowel/Gastric: Goal: Will not experience complications related to bowel motility Outcome: Not Progressing Patient has not had a bowel movement since 5/28.

## 2017-05-14 NOTE — Care Management Note (Signed)
Case Management Note  Patient Details  Name: Mike Decker MRN: [id number] Date of Birth: 08-19-1941  Subjective/Objective:                 Transferred out of icu to 2A.  PT consult pending.  Patient is from home. Prior to admission, independent in all adls, denies issues accessing medical care, obtaining medications or with transportation.  Current with  PCP.  Home cpap. Has concerns regarding current  diabetic regimen not controlling his blood sugars  and can not be seen by his endocrinologist while inpatient. He would be open to having home health services and would also consider skilled nursing placement if it is recommended.  [facility] would be his preference.  His readmission risk is medium.  Patient has complex medical history and many meds.  He is knowledgeable of disease management and does not have history of frequent presentations - showing that he is able to manage regimen wello.  Action/Plan  Await recommendations of physical therapy  Expected Discharge Date:                  Expected Discharge Plan:     In-House Referral:     Discharge planning Services     Post Acute Care Choice:    Choice offered to:     DME Arranged:    DME Agency:     HH Arranged:    HH Agency:     Status of Service:     If discussed at [employer], dates discussed:    Additional Comments:  Katrina Stack, RN 05/14/2017, 10:07 AM

## 2017-05-15 LAB — BASIC METABOLIC PANEL
ANION GAP: 9 (ref 5–15)
BUN: 71 mg/dL — ABNORMAL HIGH (ref 6–20)
CALCIUM: 8.2 mg/dL — AB (ref 8.9–10.3)
CHLORIDE: 99 mmol/L — AB (ref 101–111)
CO2: 26 mmol/L (ref 22–32)
CREATININE: 1.85 mg/dL — AB (ref 0.61–1.24)
GFR calc non Af Amer: 33 mL/min — ABNORMAL LOW (ref >60)
GFR, EST AFRICAN AMERICAN: 39 mL/min — AB (ref >60)
Glucose, Bld: 125 mg/dL — ABNORMAL HIGH (ref 65–99)
Potassium: 3.2 mmol/L — ABNORMAL LOW (ref 3.5–5.1)
SODIUM: 134 mmol/L — AB (ref 135–145)

## 2017-05-15 LAB — GLUCOSE, CAPILLARY
GLUCOSE-CAPILLARY: 114 mg/dL — AB (ref 65–99)
GLUCOSE-CAPILLARY: 101 mg/dL — AB (ref 65–99)

## 2017-05-15 MED ORDER — IPRATROPIUM-ALBUTEROL 0.5-2.5 (3) MG/3ML IN SOLN: 3.0000 mL | RESPIRATORY_TRACT | 0 refills | Status: DC | PRN

## 2017-05-15 MED ORDER — FUROSEMIDE 20 MG PO TABS: 40.0000 mg | ORAL_TABLET | Freq: Two times a day (BID) | ORAL | 0 refills | Status: DC

## 2017-05-15 MED ORDER — BISACODYL 5 MG PO TBEC
10.0000 mg | DELAYED_RELEASE_TABLET | Freq: Once | ORAL | Status: AC
  Administered 2017-05-15: 10 mg via ORAL
  Filled 2017-05-15: qty 2

## 2017-05-15 MED ORDER — METOPROLOL TARTRATE 50 MG PO TABS
50.0000 mg | ORAL_TABLET | Freq: Two times a day (BID) | ORAL | Status: DC
  Administered 2017-05-15: 50 mg via ORAL
  Filled 2017-05-15: qty 1

## 2017-05-15 MED ORDER — POTASSIUM CHLORIDE CRYS ER 20 MEQ PO TBCR
40.0000 meq | EXTENDED_RELEASE_TABLET | Freq: Once | ORAL | Status: AC
  Administered 2017-05-15: 40 meq via ORAL
  Filled 2017-05-15: qty 2

## 2017-05-15 MED ORDER — METOPROLOL TARTRATE 75 MG PO TABS: 75.0000 mg | ORAL_TABLET | Freq: Two times a day (BID) | ORAL | 0 refills | Status: DC

## 2017-05-15 NOTE — Care Management (Signed)
Physical therapy has recommended home with home health.  Provided patient with list of home health agencies. No preference other than would rather not to have [facility].  Mike Decker has availability within 24 hours of discharge.  Referral called and accepted for RN PT OT and possibly aide

## 2017-05-15 NOTE — Progress Notes (Signed)
[facility], [place] 05/15/17  Subjective:  Renal function remains stable at the moment. Creatinine currently 1.8. Urine output was 1.7 L over the preceding 24 hours.   Objective:  Vital signs in last 24 hours:  Temp:  [97.9 F (36.6 C)-98.1 F (36.7 C)] 97.9 F (36.6 C) (06/06 1145) Pulse Rate:  [48-61] 58 (06/06 1145) Resp:  [14-19] 14 (06/06 1145) BP: (111-129)/(49-61) 111/49 (06/06 1145) SpO2:  [94 %-96 %] 95 % (06/06 1145)  Weight change:  Filed Weights   05/06/17 1857 05/07/17 0046 05/07/17 0615  Weight: 87.5 kg (193 lb) 86.6 kg (190 lb 14.4 oz) 86.2 kg (190 lb 0.6 oz)    Intake/Output:    Intake/Output Summary (Last 24 hours) at 05/15/17 1147 Last data filed at 05/15/17 1030  Gross per 24 hour  Intake              840 ml  Output             1875 ml  Net            -1035 ml     Physical Exam: General: NAD  HEENT [place]/AT EOMI hearing intact OM moist  Neck supple  Pulm/lungs Mild basilar crackles, normal effort  CVS/Heart S1S2 no rubs  Abdomen:  Soft, Non tender  Extremities: No peripheral edema  Neurologic: Alert, oriented, follows commands  Skin: No rashes          Basic Metabolic Panel:   Recent Labs Lab 05/09/17 0549  05/11/17 0426 05/12/17 0426 05/12/17 1640 05/13/17 0518 05/14/17 0419 05/15/17 0427  NA 139  < > 141 136  --  136 132* 134*  K 3.4*  < > 4.2 3.9  --  3.5 3.7 3.2*  CL 105  < > 107 101  --  99* 96* 99*  CO2 26  < > 23 25  --  25 27 26   GLUCOSE 170*  < > 252* 230* 436* 164* 313* 125*  BUN 45*  < > 45* 61*  --  72* 70* 71*  CREATININE 1.99*  < > 1.82* 1.87*  --  2.03* 1.87* 1.85*  CALCIUM 7.9*  < > 8.5* 8.7*  --  8.5* 8.6* 8.2*  MG 1.9  --   --   --   --  1.9  --   --   PHOS  --   --   --   --   --  4.1  --   --   < > = values in this interval not displayed.   CBC:  Recent Labs Lab 05/09/17 0549 05/11/17 0426 05/12/17 0426 05/13/17 0518  WBC 8.5 9.3 16.7* 16.8*  HGB 9.9* 10.3* 10.1*  10.4*  HCT 29.0* 30.2* 30.0* 30.8*  MCV 88.7 88.1 86.6 86.6  PLT 208 247 324 347     No results found for: HEPBSAG, HEPBSAB, HEPBIGM    Microbiology:  Recent Results (from the past 240 hour(s))  Blood Culture (routine x 2)     Status: None   Collection Time: 05/06/17  7:02 PM  Result Value Ref Range Status   Specimen Description BLOOD RIGHT HAND  Final   Special Requests   Final    BOTTLES DRAWN AEROBIC AND ANAEROBIC Blood Culture adequate volume   Culture NO GROWTH 5 DAYS  Final   Report Status 05/11/2017 FINAL  Final  Blood Culture (routine x 2)     Status: None   Collection Time: 05/06/17  7:02 PM  Result Value Ref Range Status   Specimen Description BLOOD LEFT AC  Final   Special Requests   Final    BOTTLES DRAWN AEROBIC AND ANAEROBIC Blood Culture adequate volume   Culture NO GROWTH 5 DAYS  Final   Report Status 05/11/2017 FINAL  Final  Urine culture     Status: None   Collection Time: 05/06/17  7:18 PM  Result Value Ref Range Status   Specimen Description URINE, RANDOM  Final   Special Requests NONE  Final   Culture   Final    NO GROWTH Performed at [facility], [place] [address], [place], [place] [address]    Report Status 05/08/2017 FINAL  Final  Gastrointestinal Panel by PCR , Stool     Status: Abnormal   Collection Time: 05/07/17  4:44 AM  Result Value Ref Range Status   Campylobacter species NOT DETECTED NOT DETECTED Final   Plesimonas shigelloides NOT DETECTED NOT DETECTED Final   Salmonella species DETECTED (A) NOT DETECTED Final    Comment: RESULT CALLED TO, READ BACK BY AND VERIFIED WITH: BETH BUONO 05/07/17 0642 ALV    Yersinia enterocolitica NOT DETECTED NOT DETECTED Final   Vibrio species NOT DETECTED NOT DETECTED Final   Vibrio cholerae NOT DETECTED NOT DETECTED Final   Enteroaggregative E coli (EAEC) NOT DETECTED NOT DETECTED Final   Enteropathogenic E coli (EPEC) NOT DETECTED NOT DETECTED Final   Enterotoxigenic E coli (ETEC) NOT DETECTED  NOT DETECTED Final   Shiga like toxin producing E coli (STEC) NOT DETECTED NOT DETECTED Final   E. coli O157 NOT DETECTED NOT DETECTED Final   Shigella/Enteroinvasive E coli (EIEC) NOT DETECTED NOT DETECTED Final   Cryptosporidium NOT DETECTED NOT DETECTED Final   Cyclospora cayetanensis NOT DETECTED NOT DETECTED Final   Entamoeba histolytica NOT DETECTED NOT DETECTED Final   Giardia lamblia NOT DETECTED NOT DETECTED Final   Adenovirus F40/41 NOT DETECTED NOT DETECTED Final   Astrovirus NOT DETECTED NOT DETECTED Final   Norovirus GI/GII NOT DETECTED NOT DETECTED Final   Rotavirus A NOT DETECTED NOT DETECTED Final   Sapovirus (I, II, IV, and V) NOT DETECTED NOT DETECTED Final  C difficile quick scan w PCR reflex     Status: None   Collection Time: 05/07/17  4:44 AM  Result Value Ref Range Status   C Diff antigen NEGATIVE NEGATIVE Final   C Diff toxin NEGATIVE NEGATIVE Final   C Diff interpretation No C. difficile detected.  Final  MRSA PCR Screening     Status: None   Collection Time: 05/07/17  6:24 AM  Result Value Ref Range Status   MRSA by PCR NEGATIVE NEGATIVE Final    Comment:        The GeneXpert MRSA Assay (FDA approved for NASAL specimens only), is one component of a comprehensive MRSA colonization surveillance program. It is not intended to diagnose MRSA infection nor to guide or monitor treatment for MRSA infections.     Coagulation Studies: No results for input(s): LABPROT, INR in the last 72 hours.  Urinalysis: No results for input(s): COLORURINE, LABSPEC, PHURINE, GLUCOSEU, HGBUR, BILIRUBINUR, KETONESUR, PROTEINUR, UROBILINOGEN, NITRITE, LEUKOCYTESUR in the last 72 hours.  Invalid input(s): APPERANCEUR    Imaging: No results found.   Medications:    . allopurinol  200 mg Oral BID  . amLODipine  5 mg Oral BID  . aspirin EC  81 mg Oral Daily  . budesonide (PULMICORT) nebulizer solution  0.5 mg Nebulization BID  .  chlorhexidine  15 mL Mouth Rinse BID   . ciprofloxacin  250 mg Oral BID  . citalopram  10 mg Oral Daily  . clopidogrel  75 mg Oral Daily  . enoxaparin (LOVENOX) injection  40 mg Subcutaneous Q24H  . furosemide  40 mg Oral Daily  . insulin aspart  0-15 Units Subcutaneous TID WC  . insulin aspart  0-5 Units Subcutaneous QHS  . insulin aspart  6 Units Subcutaneous TID WC  . insulin glargine  55 Units Subcutaneous QHS  . lisinopril  5 mg Oral Daily  . loratadine  10 mg Oral Daily  . metoprolol tartrate  50 mg Oral BID  . pantoprazole  40 mg Oral BID  . polyethylene glycol  17 g Oral Daily  . rosuvastatin  40 mg Oral Daily  . sodium chloride flush  3 mL Intravenous Q12H  . sucralfate  1 g Oral TID WC & HS  . Vitamin D (Ergocalciferol)  50,000 Units Oral Q30 days   acetaminophen, chlorpheniramine-HYDROcodone, dextrose, guaiFENesin **AND** [DISCONTINUED] dextromethorphan, ipratropium-albuterol, nitroGLYCERIN, ondansetron (ZOFRAN) IV  Assessment/ Plan:  76 y.o. 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  1. 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 Creatinine currently 1.85.  Patient will need continued monitoring of renal function as an outpatient.  2. SOB - Respiratory status appears to be stable. Continue diuresis with Lasix.  3. DM-2 with CKD - Baseline Cr 1.7/GFR 38 as outpatient - HbA1c 8.2 % (05/02/17)  4. Acute gastroenteritis - Salmonella detected - C diff negative - Patient treated with Cipro earlier in the course of hospitalization.  5. Acute NSTEMI - Mgmt as per ICU team/Cardiology. - coronary angiography 5/31- medical management recommended    LOS: 9 Mike Decker 6/6/201811:47 [place], [place]

## 2017-05-15 NOTE — Care Management (Signed)
Re evaluation by physical therapy and continue to support recommendation that functionally patient does not meet criteria for skilled nursing.  Obtained order for rolling walker to be provided by [facility]

## 2017-05-15 NOTE — Care Management (Addendum)
Concern address by physician that patient  is requiring more assist when up than can be supported in the home.  Patient's wife is not able to provide the physical assist and the two adult children in the home have mental challenges.  There is also concern address that patient at present will require close on going monitoring monitoring and assessment by registered nurse as he medically stable for discharge from acute level of care but is still medically fragile.  He is experiencing hemoptysis and a bronchoscopy will be pursued as an outpatient.  Physical therapy to reassess.

## 2017-05-15 NOTE — Progress Notes (Signed)
Physical Therapy Treatment Patient Details Name: Mike Decker MRN: [id number] DOB: [birth year] Today's Date: 05/15/2017    History of Present Illness 76 y/o male here with NSTEMI.  He was in CCU 1 week needing supplemental O2, transfered to floor day of PT exam.       PT Comments    Pt presents with mild deficits in strength, transfers, gait, balance, and activity tolerance.  Pt ind with bed mobility tasks without need of extra time and effort.  Pt SBA with transfers with good effort and good stability upon initial stand.  Pt able to amb 150' x 2 with RW with slow cadence and short B step length but steady without LOB.  SpO2 on room air >/= 95% throughout with HR ranging from low to high 60's, no SOB.  Pt steady ascending/descending 4 steps with CGA and B rails with SpO2 and HR remaining WNL.  Pt will benefit from HHPT services to address above deficits for decreased caregiver assistance and return to PLOF upon discharge.     Follow Up Recommendations  Home health PT     Equipment Recommendations  Rolling walker with 5" wheels    Recommendations for Other Services       Precautions / Restrictions Precautions Precautions: Fall Restrictions Weight Bearing Restrictions: No    Mobility  Bed Mobility Overal bed mobility: Independent                Transfers Overall transfer level: Needs assistance Equipment used: Rolling walker (2 wheeled) Transfers: Sit to/from Stand Sit to Stand: Supervision         General transfer comment: Good effort with sit to/from stand transfers with good initial stability upon standing  Ambulation/Gait Ambulation/Gait assistance: Supervision Ambulation Distance (Feet): 150 Feet Assistive device: Rolling walker (2 wheeled) Gait Pattern/deviations: Decreased step length - right;Decreased step length - left;Trunk flexed   Gait velocity interpretation: Below normal speed for age/gender General Gait Details: Pt able to amb 150' x 2 with RW  with slow cadence and short B step length but steady without LOB.  SpO2 on room air >/= 95% throughout with HR ranging from low to high 60's, no SOB.    Stairs Stairs: Yes   Stair Management: Two rails Number of Stairs: 4 General stair comments: Pt steady up/down 4 stairs with B rails and CGA without LOB or SOB.  Wheelchair Mobility    Modified Rankin (Stroke Patients Only)       Balance Overall balance assessment: Needs assistance Sitting-balance support: Feet supported;No upper extremity supported Sitting balance-Leahy Scale: Normal     Standing balance support: No upper extremity supported Standing balance-Leahy Scale: Good                              Cognition Arousal/Alertness: Awake/alert Behavior During Therapy: WFL for tasks assessed/performed Overall Cognitive Status: Within Functional Limits for tasks assessed                                        Exercises Total Joint Exercises Ankle Circles/Pumps: AROM;Both;10 reps Quad Sets: Strengthening;Both;10 reps Gluteal Sets: Strengthening;Both;10 reps Heel Slides: AROM;Both;5 reps Marching in Standing: AROM;Both;10 reps Other Exercises Other Exercises: HEP education for BLE APs, GS, and QS x 10 each 5-6x/day Other Exercises: Static standing balance training with multiple foot positions with combinations of eyes open/closed and  head still/head turns with good overall stability.    General Comments        Pertinent Vitals/Pain Pain Assessment: No/denies pain    Home Living                      Prior Function            PT Goals (current goals can now be found in the care plan section) Progress towards PT goals: Progressing toward goals    Frequency    Min 2X/week      PT Plan Current plan remains appropriate    Co-evaluation              AM-PAC PT "6 Clicks" Daily Activity  Outcome Measure  Difficulty turning over in bed (including adjusting  bedclothes, sheets and blankets)?: None Difficulty moving from lying on back to sitting on the side of the bed? : None Difficulty sitting down on and standing up from a chair with arms (e.g., wheelchair, bedside commode, etc,.)?: None Help needed moving to and from a bed to chair (including a wheelchair)?: None Help needed walking in hospital room?: A Little Help needed climbing 3-5 steps with a railing? : A Little 6 Click Score: 22    End of Session Equipment Utilized During Treatment: Gait belt Activity Tolerance: Patient tolerated treatment well Patient left: in bed;with bed alarm set;with family/visitor present;with call bell/phone within reach   PT Visit Diagnosis: Muscle weakness (generalized) (M62.81);Difficulty in walking, not elsewhere classified (R26.2)     Time: 6720-9470 PT Time Calculation (min) (ACUTE ONLY): 39 min  Charges:  $Gait Training: 23-37 mins $Therapeutic Exercise: 8-22 mins                    G Codes:       DRoyetta Asal PT, DPT 05/15/17, 12:33 PM

## 2017-05-15 NOTE — Discharge Instructions (Signed)
Resume diet and activity as before.  Please call your doctor or return to ER if any worsening with your breathing or blood in sputum

## 2017-05-15 NOTE — Care Management Important Message (Signed)
Important Message  Patient Details  Name: Mike Decker MRN: [id number] Date of Birth: Jan 07, 1941   Medicare Important Message Given:  Yes Signed IM notice given    Katrina Stack, RN 05/15/2017, 9:18 AM

## 2017-05-16 DIAGNOSIS — I130 Hypertensive heart and chronic kidney disease with heart failure and stage 1 through stage 4 chronic kidney disease, or unspecified chronic kidney disease: Secondary | ICD-10-CM | POA: Diagnosis not present

## 2017-05-16 DIAGNOSIS — I5033 Acute on chronic diastolic (congestive) heart failure: Secondary | ICD-10-CM | POA: Diagnosis not present

## 2017-05-16 NOTE — Discharge Summary (Signed)
Mike Decker NAME: Mike Decker    MR#:  [id number]  DATE OF BIRTH:  [birth year]  DATE OF ADMISSION:  05/06/2017 ADMITTING PHYSICIAN: Harvie Bridge, DO  DATE OF DISCHARGE: 05/15/2017  3:44 PM  PRIMARY CARE PHYSICIAN: Cletis Athens, MD   ADMISSION DIAGNOSIS:  SOB (shortness of breath) [R06.02] Left lower quadrant pain [R10.32] LLQ abdominal pain [R10.32] NSTEMI (non-ST elevated myocardial infarction) ([place]) [I21.4] Fever, unspecified fever cause [R50.9]  DISCHARGE DIAGNOSIS:  Active Problems:   NSTEMI (non-ST elevated myocardial infarction) ([place])   SECONDARY DIAGNOSIS:   Past Medical History:  Diagnosis Date  . Arthritis   . Atrioventricular canal (AVC)    irregular heart beats  . Barrett esophagus   . Cancer [facility]) 2002   prostate  . Colon polyp   . Diabetes mellitus without complication ([place])   . Diverticulosis   . Gout   . Heart disease   . Hemangioma    liver  . Hyperlipidemia   . Hypertension   . Myocardial infarct ([place])   . Ocular hypertension   . Peripheral vascular disease ([place])   . Skin cancer   . Sleep apnea   . Vitreoretinal degeneration      ADMITTING HISTORY   HISTORY OF PRESENT ILLNESS: Mike Decker is a 76 y.o. 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    Patient was in a usual state of health until about 3 days ago when he describes sudden onset of left lower quadrant abdominal pain described as cramping associated with liquid diarrhea and nausea. Patient states he has had diverticulitis flareups in the past so he called his primary care provider who prescribed  him some Cipro and Flagyl. He took 3 doses of Cipro however his symptoms worsened prompting emergency department visit today.   On his way to the hospital by ambulance today he developed central chest pain associated with shortness of breath, nausea, anxiety. Pain was localized and has since resolved. On arrival he was found to be 2 With an O2 sat of 77%. Patient denies fevers/chills, weakness, dizziness, dysuria/frequency, changes in mental status.   Otherwise there has been no change in status. Patient has been taking medication as prescribed and there has been no recent change in medication or diet.  There has been no recent illness, travel or sick contacts.   In the emergency department patient was found to have some ST depression on EKG as well as an elevated troponin of 0.34 and 5. He received aspirin and was started on a heparin drip.  HOSPITAL COURSE:   # Mild hemoptysis likely from cough. On ASA and plavix Monitor  #. Acute hypoxic respiratory failure due to Pulmonary edema with pleural effusion, secondary to Acute on chronic diastolic CHF Diuresed well with IV Lasix. Later transitioned to oral Lasix. BUN and creatinine have remained . Some worsening initially which has improved. He is on room air by the day of dischargesaturations at 96%.  #. NSTEMI, history of coronary artery disease - with telemetry monitoring, continuous pulse ox - Checked echo- EF 55% -Continue Lopressor, nitroglycerin, Plavix, Crestor. - Cath 05/09/17- double vessel CAD, previous RCA stent is patent. Medical management advised. - Heparin drip stopped  * Afib with RVR-  improved On lopressor. Dose increased at discharge. Prescription sent to pharmacy. Ideally he should be on anticoagulation. Discussed with Dr. Clayborn Bigness of cardiology. But anticoagulation held due to some hemoptysis. Patient has follow-up with Dr. Clayborn Bigness on 05/19/2017.  #.Salmonella gastroenteritis On ciprofloxacin. Finished course in the  hospital  #. CKD3, Creatinine near baseline Mild worsening due to diuresis has improved Oral Lasix at discharge with nephrology follow-up.  #. H/o Diabetes Back on lantus 55 units QHS and pre-meal novolog and SSI Uncontrolled due to IV steroids in the hospital.  #. History of COPD -Continue Symbicort, duoneb.  #. History of hypertension -Continue Norvasc, Lopressor Clonidine stopped Lisinopril stopped due to acute kidney injury and resumed at discharge  #. History of Barrett's esophagus/GERD -Continue Carafate, Protonix  #. History of depression -Continue Celexa  #. History of gout On allopurinol, colchicine  # History of obstructive sleep apnea  # mild hemoptysis likely to coughing. Stable for 3 days. Advised to follow-up with pulmonary as outpatient. He is to return if there is any worsening. Will need bronchoscopy if no improvement or worsening.  Stable for discharge home with rolling walker and home health.  CONSULTS OBTAINED:  Treatment Team:  Murlean Iba, MD  DRUG ALLERGIES:   Allergies  Allergen Reactions  . Peanut-Containing Drug Products Anaphylaxis  . Penicillins Hives and Rash    Has patient had a PCN reaction causing immediate rash, facial/tongue/throat swelling, SOB or lightheadedness with hypotension: Yes Has patient had a PCN reaction causing severe rash involving mucus membranes or skin necrosis: No Has patient had a PCN reaction that required hospitalization No Has patient had a PCN reaction occurring within the last 10 years: No If all of the above answers are "NO", then may proceed with Cephalosporin use.  . Bee Venom Swelling  . Influenza Vaccines Hives  . Inh [Isoniazid] Hives  . Kenalog [Triamcinolone Acetonide] Hives  . Nalfon [Fenoprofen Calcium] Hives    DISCHARGE MEDICATIONS:   Discharge Medication List as of 05/15/2017 12:48 PM    START taking these medications   Details  ipratropium-albuterol (DUONEB) 0.5-2.5 (3)  MG/3ML SOLN Take 3 mLs by nebulization every 4 (four) hours as needed., Starting Wed 05/15/2017, Normal      CONTINUE these medications which have CHANGED   Details  furosemide (LASIX) 20 MG tablet Take 2 tablets (40 mg total) by mouth 2 (two) times daily., Starting Wed 05/15/2017, Normal    metoprolol tartrate 75 MG TABS Take 75 mg by mouth 2 (two) times daily., Starting Wed 05/15/2017, Normal      CONTINUE these medications which have NOT CHANGED   Details  acetaminophen (TYLENOL) 500 MG tablet Take 1,000 mg by mouth every 6 (six) hours as needed for mild pain, moderate pain or headache., Historical Med    allopurinol (ZYLOPRIM) 100 MG tablet Take 200 mg by mouth 2 (two) times daily. , Historical Med    amLODipine (NORVASC) 5 MG tablet TAKE 1 TABLET (5 MG TOTAL) BY MOUTH 2 (TWO) TIMES DAILY., Historical Med    aspirin (ASPIRIN CHILDRENS) 81 MG chewable tablet Chew 1 tablet (81 mg total) by mouth daily., Starting Sun 10/09/2015, Normal    budesonide-formoterol (SYMBICORT) 160-4.5 MCG/ACT inhaler Inhale 2 puffs into the lungs 2 (two) times daily., Historical Med    cetirizine (ZYRTEC) 5 MG tablet Take 5 mg by mouth daily as needed for allergies or rhinitis. , Historical Med    Cholecalciferol (VITAMIN D3) 50000 UNITS CAPS Take 50,000 Units by mouth every 30 (thirty)  days., Historical Med    citalopram (CELEXA) 10 MG tablet Take 10 mg by mouth daily., Historical Med    clopidogrel (PLAVIX) 75 MG tablet Take 1 tablet (75 mg total) by mouth daily., Starting Sun 10/09/2015, Normal    colchicine 0.6 MG tablet Take 0.6 mg by mouth 3 (three) times daily., Historical Med    dextromethorphan-guaiFENesin (MUCINEX DM) 30-600 MG 12hr tablet Take 1 tablet by mouth 2 (two) times daily as needed for cough., Historical Med    EPINEPHrine (EPI-PEN) 0.3 mg/0.3 mL SOAJ injection Inject into the muscle as needed (anaphylaxis). , Historical Med    insulin aspart (NOVOLOG FLEXPEN) 100 UNIT/ML FlexPen Inject  14-20 Units into the skin See admin instructions. Inject 14 units every morning with breakfast and follow sliding scale for the rest of meals., Starting Tue 08/30/2015, Historical Med    insulin glargine (LANTUS) 100 UNIT/ML injection Inject 55 Units into the skin at bedtime. , Historical Med    isosorbide mononitrate (IMDUR) 120 MG 24 hr tablet Take 1 tablet (120 mg total) by mouth daily., Starting Sun 10/09/2015, Normal    lisinopril (PRINIVIL,ZESTRIL) 10 MG tablet Take 10 mg by mouth daily., Historical Med    nitroGLYCERIN (NITROSTAT) 0.4 MG SL tablet Place 0.4 mg under the tongue every 5 (five) minutes x 3 doses as needed for chest pain. , Historical Med    pantoprazole (PROTONIX) 40 MG tablet Take 40 mg by mouth 2 (two) times daily., Historical Med    polyethylene glycol powder (GLYCOLAX/MIRALAX) powder Use as directed, Historical Med    rosuvastatin (CRESTOR) 40 MG tablet Take 40 mg by mouth daily., Historical Med    CARAFATE 1 GM/10ML suspension TAKE 2 TEASPOONSFUL (10MLS) BY MOUTH 4 TIMES A DAY BEFORE MEALS AND NIGHTLY FOR 14 DAYS, Historical Med      STOP taking these medications     ciprofloxacin (CIPRO) 500 MG tablet      cloNIDine (CATAPRES) 0.1 MG tablet      metroNIDAZOLE (FLAGYL) 250 MG tablet         Today   VITAL SIGNS:  Blood pressure (!) 111/49, pulse 66, temperature 97.9 F (36.6 C), temperature source Oral, resp. rate 14, height 5\' 7"  (1.702 m), weight 86.2 kg (190 lb 0.6 oz), SpO2 97 %.  I/O:  No intake or output data in the 24 hours ending 05/16/17 1348  PHYSICAL EXAMINATION:  Physical Exam  GENERAL:  76 y.o.-year-old patient lying in the bed with no acute distress.  LUNGS: Normal breath sounds bilaterally, no wheezing, rales,rhonchi or crepitation. No use of accessory muscles of respiration.  CARDIOVASCULAR: S1, S2 normal. No murmurs, rubs, or gallops.  ABDOMEN: Soft, non-tender, non-distended. Bowel sounds present. No organomegaly or mass.   NEUROLOGIC: Moves all 4 extremities. PSYCHIATRIC: The patient is alert and oriented x 3.  SKIN: No obvious rash, lesion, or ulcer.   DATA REVIEW:   CBC  Recent Labs Lab 05/13/17 0518  WBC 16.8*  HGB 10.4*  HCT 30.8*  PLT 347    Chemistries   Recent Labs Lab 05/13/17 0518  05/15/17 0427  NA 136  < > 134*  K 3.5  < > 3.2*  CL 99*  < > 99*  CO2 25  < > 26  GLUCOSE 164*  < > 125*  BUN 72*  < > 71*  CREATININE 2.03*  < > 1.85*  CALCIUM 8.5*  < > 8.2*  MG 1.9  --   --   < > = values  in this interval not displayed.  Cardiac Enzymes No results for input(s): TROPONINI in the last 168 hours.  Microbiology Results  Results for orders placed or performed during the hospital encounter of 05/06/17  Blood Culture (routine x 2)     Status: None   Collection Time: 05/06/17  7:02 PM  Result Value Ref Range Status   Specimen Description BLOOD RIGHT HAND  Final   Special Requests   Final    BOTTLES DRAWN AEROBIC AND ANAEROBIC Blood Culture adequate volume   Culture NO GROWTH 5 DAYS  Final   Report Status 05/11/2017 FINAL  Final  Blood Culture (routine x 2)     Status: None   Collection Time: 05/06/17  7:02 PM  Result Value Ref Range Status   Specimen Description BLOOD LEFT AC  Final   Special Requests   Final    BOTTLES DRAWN AEROBIC AND ANAEROBIC Blood Culture adequate volume   Culture NO GROWTH 5 DAYS  Final   Report Status 05/11/2017 FINAL  Final  Urine culture     Status: None   Collection Time: 05/06/17  7:18 PM  Result Value Ref Range Status   Specimen Description URINE, RANDOM  Final   Special Requests NONE  Final   Culture   Final    NO GROWTH Performed at [facility], [place] [address], [place], [place] [address]    Report Status 05/08/2017 FINAL  Final  Gastrointestinal Panel by PCR , Stool     Status: Abnormal   Collection Time: 05/07/17  4:44 AM  Result Value Ref Range Status   Campylobacter species NOT DETECTED NOT DETECTED Final   Plesimonas  shigelloides NOT DETECTED NOT DETECTED Final   Salmonella species DETECTED (A) NOT DETECTED Final    Comment: RESULT CALLED TO, READ BACK BY AND VERIFIED WITH: BETH BUONO 05/07/17 0642 ALV    Yersinia enterocolitica NOT DETECTED NOT DETECTED Final   Vibrio species NOT DETECTED NOT DETECTED Final   Vibrio cholerae NOT DETECTED NOT DETECTED Final   Enteroaggregative E coli (EAEC) NOT DETECTED NOT DETECTED Final   Enteropathogenic E coli (EPEC) NOT DETECTED NOT DETECTED Final   Enterotoxigenic E coli (ETEC) NOT DETECTED NOT DETECTED Final   Shiga like toxin producing E coli (STEC) NOT DETECTED NOT DETECTED Final   E. coli O157 NOT DETECTED NOT DETECTED Final   Shigella/Enteroinvasive E coli (EIEC) NOT DETECTED NOT DETECTED Final   Cryptosporidium NOT DETECTED NOT DETECTED Final   Cyclospora cayetanensis NOT DETECTED NOT DETECTED Final   Entamoeba histolytica NOT DETECTED NOT DETECTED Final   Giardia lamblia NOT DETECTED NOT DETECTED Final   Adenovirus F40/41 NOT DETECTED NOT DETECTED Final   Astrovirus NOT DETECTED NOT DETECTED Final   Norovirus GI/GII NOT DETECTED NOT DETECTED Final   Rotavirus A NOT DETECTED NOT DETECTED Final   Sapovirus (I, II, IV, and V) NOT DETECTED NOT DETECTED Final  C difficile quick scan w PCR reflex     Status: None   Collection Time: 05/07/17  4:44 AM  Result Value Ref Range Status   C Diff antigen NEGATIVE NEGATIVE Final   C Diff toxin NEGATIVE NEGATIVE Final   C Diff interpretation No C. difficile detected.  Final  MRSA PCR Screening     Status: None   Collection Time: 05/07/17  6:24 AM  Result Value Ref Range Status   MRSA by PCR NEGATIVE NEGATIVE Final    Comment:        The GeneXpert MRSA  Assay (FDA approved for NASAL specimens only), is one component of a comprehensive MRSA colonization surveillance program. It is not intended to diagnose MRSA infection nor to guide or monitor treatment for MRSA infections.     RADIOLOGY:  No results  found.  Follow up with PCP in 1 week.  Management plans discussed with the patient, family and they are in agreement.  CODE STATUS:  Code Status History    Date Active Date Inactive Code Status Order ID Comments User Context   05/07/2017 12:47 AM 05/15/2017  6:50 PM Full Code [id number]  Harvie Bridge, DO Inpatient   10/08/2015  4:48 PM 10/09/2015  4:59 PM Full Code [id number]  Vaughan Basta, MD Inpatient    Advance Directive Documentation     Most Recent Value  Type of Advance Directive  Healthcare Power of Attorney, Living will  Pre-existing out of facility DNR order (yellow form or pink MOST form)  -  "MOST" Form in Place?  -      TOTAL TIME TAKING CARE OF THIS PATIENT ON DAY OF DISCHARGE: more than 30 minutes.   Hillary Bow R M.D on 05/16/2017 at 1:48 PM  Between 7am to 6pm - Pager - [phone]  After 6pm go to [email or web address] - password [email or web address]  Office  [phone]  CC: Primary care physician; Cletis Athens, MD  Note: This dictation was prepared with Dragon dictation along with smaller phrase technology. Any transcriptional errors that result from this process are unintentional.

## 2017-05-17 DIAGNOSIS — I5033 Acute on chronic diastolic (congestive) heart failure: Secondary | ICD-10-CM | POA: Diagnosis not present

## 2017-05-17 DIAGNOSIS — I130 Hypertensive heart and chronic kidney disease with heart failure and stage 1 through stage 4 chronic kidney disease, or unspecified chronic kidney disease: Secondary | ICD-10-CM | POA: Diagnosis not present

## 2017-05-20 DIAGNOSIS — I5033 Acute on chronic diastolic (congestive) heart failure: Secondary | ICD-10-CM | POA: Diagnosis not present

## 2017-05-20 DIAGNOSIS — I130 Hypertensive heart and chronic kidney disease with heart failure and stage 1 through stage 4 chronic kidney disease, or unspecified chronic kidney disease: Secondary | ICD-10-CM | POA: Diagnosis not present

## 2017-05-21 DIAGNOSIS — I130 Hypertensive heart and chronic kidney disease with heart failure and stage 1 through stage 4 chronic kidney disease, or unspecified chronic kidney disease: Secondary | ICD-10-CM | POA: Diagnosis not present

## 2017-05-21 DIAGNOSIS — I5033 Acute on chronic diastolic (congestive) heart failure: Secondary | ICD-10-CM | POA: Diagnosis not present

## 2017-05-23 DIAGNOSIS — I5033 Acute on chronic diastolic (congestive) heart failure: Secondary | ICD-10-CM | POA: Diagnosis not present

## 2017-05-23 DIAGNOSIS — I130 Hypertensive heart and chronic kidney disease with heart failure and stage 1 through stage 4 chronic kidney disease, or unspecified chronic kidney disease: Secondary | ICD-10-CM | POA: Diagnosis not present

## 2017-05-24 DIAGNOSIS — I2510 Atherosclerotic heart disease of native coronary artery without angina pectoris: Secondary | ICD-10-CM | POA: Diagnosis not present

## 2017-05-24 DIAGNOSIS — I130 Hypertensive heart and chronic kidney disease with heart failure and stage 1 through stage 4 chronic kidney disease, or unspecified chronic kidney disease: Secondary | ICD-10-CM | POA: Diagnosis not present

## 2017-05-24 DIAGNOSIS — I739 Peripheral vascular disease, unspecified: Secondary | ICD-10-CM | POA: Diagnosis not present

## 2017-05-24 DIAGNOSIS — M1A00X0 Idiopathic chronic gout, unspecified site, without tophus (tophi): Secondary | ICD-10-CM | POA: Diagnosis not present

## 2017-05-24 DIAGNOSIS — K219 Gastro-esophageal reflux disease without esophagitis: Secondary | ICD-10-CM | POA: Diagnosis not present

## 2017-05-24 DIAGNOSIS — E784 Other hyperlipidemia: Secondary | ICD-10-CM | POA: Diagnosis not present

## 2017-05-24 DIAGNOSIS — I5033 Acute on chronic diastolic (congestive) heart failure: Secondary | ICD-10-CM | POA: Diagnosis not present

## 2017-05-24 DIAGNOSIS — I208 Other forms of angina pectoris: Secondary | ICD-10-CM | POA: Diagnosis not present

## 2017-05-24 DIAGNOSIS — E669 Obesity, unspecified: Secondary | ICD-10-CM | POA: Diagnosis not present

## 2017-05-24 DIAGNOSIS — N182 Chronic kidney disease, stage 2 (mild): Secondary | ICD-10-CM | POA: Diagnosis not present

## 2017-05-24 DIAGNOSIS — I10 Essential (primary) hypertension: Secondary | ICD-10-CM | POA: Diagnosis not present

## 2017-05-24 DIAGNOSIS — J329 Chronic sinusitis, unspecified: Secondary | ICD-10-CM | POA: Diagnosis not present

## 2017-05-24 DIAGNOSIS — I639 Cerebral infarction, unspecified: Secondary | ICD-10-CM | POA: Diagnosis not present

## 2017-05-24 DIAGNOSIS — R001 Bradycardia, unspecified: Secondary | ICD-10-CM | POA: Diagnosis not present

## 2017-05-28 DIAGNOSIS — I130 Hypertensive heart and chronic kidney disease with heart failure and stage 1 through stage 4 chronic kidney disease, or unspecified chronic kidney disease: Secondary | ICD-10-CM | POA: Diagnosis not present

## 2017-05-28 DIAGNOSIS — I5033 Acute on chronic diastolic (congestive) heart failure: Secondary | ICD-10-CM | POA: Diagnosis not present

## 2017-05-29 DIAGNOSIS — I5033 Acute on chronic diastolic (congestive) heart failure: Secondary | ICD-10-CM | POA: Diagnosis not present

## 2017-05-29 DIAGNOSIS — I130 Hypertensive heart and chronic kidney disease with heart failure and stage 1 through stage 4 chronic kidney disease, or unspecified chronic kidney disease: Secondary | ICD-10-CM | POA: Diagnosis not present

## 2017-05-30 DIAGNOSIS — R0602 Shortness of breath: Secondary | ICD-10-CM | POA: Diagnosis not present

## 2017-05-30 DIAGNOSIS — I214 Non-ST elevation (NSTEMI) myocardial infarction: Secondary | ICD-10-CM | POA: Diagnosis not present

## 2017-05-30 DIAGNOSIS — G4733 Obstructive sleep apnea (adult) (pediatric): Secondary | ICD-10-CM | POA: Diagnosis not present

## 2017-05-30 DIAGNOSIS — I130 Hypertensive heart and chronic kidney disease with heart failure and stage 1 through stage 4 chronic kidney disease, or unspecified chronic kidney disease: Secondary | ICD-10-CM | POA: Diagnosis not present

## 2017-05-30 DIAGNOSIS — R042 Hemoptysis: Secondary | ICD-10-CM | POA: Diagnosis not present

## 2017-05-30 DIAGNOSIS — I5033 Acute on chronic diastolic (congestive) heart failure: Secondary | ICD-10-CM | POA: Diagnosis not present

## 2017-05-31 DIAGNOSIS — I5033 Acute on chronic diastolic (congestive) heart failure: Secondary | ICD-10-CM | POA: Diagnosis not present

## 2017-05-31 DIAGNOSIS — I130 Hypertensive heart and chronic kidney disease with heart failure and stage 1 through stage 4 chronic kidney disease, or unspecified chronic kidney disease: Secondary | ICD-10-CM | POA: Diagnosis not present

## 2017-06-03 DIAGNOSIS — I130 Hypertensive heart and chronic kidney disease with heart failure and stage 1 through stage 4 chronic kidney disease, or unspecified chronic kidney disease: Secondary | ICD-10-CM | POA: Diagnosis not present

## 2017-06-03 DIAGNOSIS — I5033 Acute on chronic diastolic (congestive) heart failure: Secondary | ICD-10-CM | POA: Diagnosis not present

## 2017-06-05 DIAGNOSIS — R0602 Shortness of breath: Secondary | ICD-10-CM | POA: Diagnosis not present

## 2017-06-06 DIAGNOSIS — I5033 Acute on chronic diastolic (congestive) heart failure: Secondary | ICD-10-CM | POA: Diagnosis not present

## 2017-06-06 DIAGNOSIS — I130 Hypertensive heart and chronic kidney disease with heart failure and stage 1 through stage 4 chronic kidney disease, or unspecified chronic kidney disease: Secondary | ICD-10-CM | POA: Diagnosis not present

## 2017-06-07 DIAGNOSIS — I5033 Acute on chronic diastolic (congestive) heart failure: Secondary | ICD-10-CM | POA: Diagnosis not present

## 2017-06-07 DIAGNOSIS — I130 Hypertensive heart and chronic kidney disease with heart failure and stage 1 through stage 4 chronic kidney disease, or unspecified chronic kidney disease: Secondary | ICD-10-CM | POA: Diagnosis not present

## 2017-06-20 DIAGNOSIS — E1165 Type 2 diabetes mellitus with hyperglycemia: Secondary | ICD-10-CM | POA: Diagnosis not present

## 2017-06-20 DIAGNOSIS — Z794 Long term (current) use of insulin: Secondary | ICD-10-CM | POA: Diagnosis not present

## 2017-06-20 DIAGNOSIS — N184 Chronic kidney disease, stage 4 (severe): Secondary | ICD-10-CM | POA: Diagnosis not present

## 2017-06-20 DIAGNOSIS — E1159 Type 2 diabetes mellitus with other circulatory complications: Secondary | ICD-10-CM | POA: Diagnosis not present

## 2017-06-20 DIAGNOSIS — E1122 Type 2 diabetes mellitus with diabetic chronic kidney disease: Secondary | ICD-10-CM | POA: Diagnosis not present

## 2017-06-20 DIAGNOSIS — E1151 Type 2 diabetes mellitus with diabetic peripheral angiopathy without gangrene: Secondary | ICD-10-CM | POA: Diagnosis not present

## 2017-06-20 DIAGNOSIS — E1142 Type 2 diabetes mellitus with diabetic polyneuropathy: Secondary | ICD-10-CM | POA: Diagnosis not present

## 2017-07-01 ENCOUNTER — Other Ambulatory Visit: Payer: Self-pay

## 2017-07-03 DIAGNOSIS — L570 Actinic keratosis: Secondary | ICD-10-CM | POA: Diagnosis not present

## 2017-07-03 DIAGNOSIS — L814 Other melanin hyperpigmentation: Secondary | ICD-10-CM | POA: Diagnosis not present

## 2017-07-03 DIAGNOSIS — Z85828 Personal history of other malignant neoplasm of skin: Secondary | ICD-10-CM | POA: Diagnosis not present

## 2017-07-03 DIAGNOSIS — X32XXXA Exposure to sunlight, initial encounter: Secondary | ICD-10-CM | POA: Diagnosis not present

## 2017-07-03 DIAGNOSIS — L821 Other seborrheic keratosis: Secondary | ICD-10-CM | POA: Diagnosis not present

## 2017-07-10 DIAGNOSIS — I129 Hypertensive chronic kidney disease with stage 1 through stage 4 chronic kidney disease, or unspecified chronic kidney disease: Secondary | ICD-10-CM | POA: Diagnosis not present

## 2017-07-10 DIAGNOSIS — N2581 Secondary hyperparathyroidism of renal origin: Secondary | ICD-10-CM | POA: Diagnosis not present

## 2017-07-10 DIAGNOSIS — E1122 Type 2 diabetes mellitus with diabetic chronic kidney disease: Secondary | ICD-10-CM | POA: Diagnosis not present

## 2017-07-10 DIAGNOSIS — R809 Proteinuria, unspecified: Secondary | ICD-10-CM | POA: Diagnosis not present

## 2017-07-10 DIAGNOSIS — N183 Chronic kidney disease, stage 3 (moderate): Secondary | ICD-10-CM | POA: Diagnosis not present

## 2017-07-16 DIAGNOSIS — I129 Hypertensive chronic kidney disease with stage 1 through stage 4 chronic kidney disease, or unspecified chronic kidney disease: Secondary | ICD-10-CM | POA: Diagnosis not present

## 2017-07-16 DIAGNOSIS — N179 Acute kidney failure, unspecified: Secondary | ICD-10-CM | POA: Diagnosis not present

## 2017-07-16 DIAGNOSIS — E872 Acidosis: Secondary | ICD-10-CM | POA: Diagnosis not present

## 2017-07-16 DIAGNOSIS — N184 Chronic kidney disease, stage 4 (severe): Secondary | ICD-10-CM | POA: Diagnosis not present

## 2017-07-16 DIAGNOSIS — M109 Gout, unspecified: Secondary | ICD-10-CM | POA: Diagnosis not present

## 2017-07-16 DIAGNOSIS — E1122 Type 2 diabetes mellitus with diabetic chronic kidney disease: Secondary | ICD-10-CM | POA: Diagnosis not present

## 2017-07-18 DIAGNOSIS — E1165 Type 2 diabetes mellitus with hyperglycemia: Secondary | ICD-10-CM | POA: Diagnosis not present

## 2017-07-18 DIAGNOSIS — E1122 Type 2 diabetes mellitus with diabetic chronic kidney disease: Secondary | ICD-10-CM | POA: Diagnosis not present

## 2017-07-18 DIAGNOSIS — N184 Chronic kidney disease, stage 4 (severe): Secondary | ICD-10-CM | POA: Diagnosis not present

## 2017-07-18 DIAGNOSIS — E1142 Type 2 diabetes mellitus with diabetic polyneuropathy: Secondary | ICD-10-CM | POA: Diagnosis not present

## 2017-07-18 DIAGNOSIS — E1151 Type 2 diabetes mellitus with diabetic peripheral angiopathy without gangrene: Secondary | ICD-10-CM | POA: Diagnosis not present

## 2017-07-18 DIAGNOSIS — Z794 Long term (current) use of insulin: Secondary | ICD-10-CM | POA: Diagnosis not present

## 2017-07-18 DIAGNOSIS — E1159 Type 2 diabetes mellitus with other circulatory complications: Secondary | ICD-10-CM | POA: Diagnosis not present

## 2017-07-24 DIAGNOSIS — G629 Polyneuropathy, unspecified: Secondary | ICD-10-CM | POA: Diagnosis not present

## 2017-07-24 DIAGNOSIS — Z8673 Personal history of transient ischemic attack (TIA), and cerebral infarction without residual deficits: Secondary | ICD-10-CM | POA: Diagnosis not present

## 2017-08-05 DIAGNOSIS — J019 Acute sinusitis, unspecified: Secondary | ICD-10-CM | POA: Diagnosis not present

## 2017-08-05 DIAGNOSIS — I119 Hypertensive heart disease without heart failure: Secondary | ICD-10-CM | POA: Diagnosis not present

## 2017-08-05 DIAGNOSIS — I43 Cardiomyopathy in diseases classified elsewhere: Secondary | ICD-10-CM | POA: Diagnosis not present

## 2017-08-05 DIAGNOSIS — J449 Chronic obstructive pulmonary disease, unspecified: Secondary | ICD-10-CM | POA: Diagnosis not present

## 2017-08-08 DIAGNOSIS — G4733 Obstructive sleep apnea (adult) (pediatric): Secondary | ICD-10-CM | POA: Diagnosis not present

## 2017-08-08 DIAGNOSIS — R0602 Shortness of breath: Secondary | ICD-10-CM | POA: Diagnosis not present

## 2017-08-08 DIAGNOSIS — I214 Non-ST elevation (NSTEMI) myocardial infarction: Secondary | ICD-10-CM | POA: Diagnosis not present

## 2017-08-16 DIAGNOSIS — Z8673 Personal history of transient ischemic attack (TIA), and cerebral infarction without residual deficits: Secondary | ICD-10-CM | POA: Diagnosis not present

## 2017-08-16 DIAGNOSIS — G629 Polyneuropathy, unspecified: Secondary | ICD-10-CM | POA: Diagnosis not present

## 2017-08-22 DIAGNOSIS — I6990 Unspecified sequelae of unspecified cerebrovascular disease: Secondary | ICD-10-CM | POA: Diagnosis not present

## 2017-08-22 DIAGNOSIS — K5732 Diverticulitis of large intestine without perforation or abscess without bleeding: Secondary | ICD-10-CM | POA: Diagnosis not present

## 2017-08-22 DIAGNOSIS — I10 Essential (primary) hypertension: Secondary | ICD-10-CM | POA: Diagnosis not present

## 2017-08-22 DIAGNOSIS — I252 Old myocardial infarction: Secondary | ICD-10-CM | POA: Diagnosis not present

## 2017-08-29 DIAGNOSIS — E669 Obesity, unspecified: Secondary | ICD-10-CM | POA: Diagnosis not present

## 2017-08-29 DIAGNOSIS — I2510 Atherosclerotic heart disease of native coronary artery without angina pectoris: Secondary | ICD-10-CM | POA: Diagnosis not present

## 2017-08-29 DIAGNOSIS — J449 Chronic obstructive pulmonary disease, unspecified: Secondary | ICD-10-CM | POA: Diagnosis not present

## 2017-08-29 DIAGNOSIS — K2270 Barrett's esophagus without dysplasia: Secondary | ICD-10-CM | POA: Diagnosis not present

## 2017-08-29 DIAGNOSIS — E7800 Pure hypercholesterolemia, unspecified: Secondary | ICD-10-CM | POA: Diagnosis not present

## 2017-08-29 DIAGNOSIS — N183 Chronic kidney disease, stage 3 (moderate): Secondary | ICD-10-CM | POA: Diagnosis not present

## 2017-08-29 DIAGNOSIS — G4733 Obstructive sleep apnea (adult) (pediatric): Secondary | ICD-10-CM | POA: Diagnosis not present

## 2017-08-29 DIAGNOSIS — I252 Old myocardial infarction: Secondary | ICD-10-CM | POA: Diagnosis not present

## 2017-08-29 DIAGNOSIS — Q402 Other specified congenital malformations of stomach: Secondary | ICD-10-CM | POA: Diagnosis not present

## 2017-08-29 DIAGNOSIS — K449 Diaphragmatic hernia without obstruction or gangrene: Secondary | ICD-10-CM | POA: Diagnosis not present

## 2017-08-29 DIAGNOSIS — D001 Carcinoma in situ of esophagus: Secondary | ICD-10-CM | POA: Diagnosis not present

## 2017-08-29 DIAGNOSIS — Z79899 Other long term (current) drug therapy: Secondary | ICD-10-CM | POA: Diagnosis not present

## 2017-08-29 DIAGNOSIS — Z87891 Personal history of nicotine dependence: Secondary | ICD-10-CM | POA: Diagnosis not present

## 2017-08-29 DIAGNOSIS — Z09 Encounter for follow-up examination after completed treatment for conditions other than malignant neoplasm: Secondary | ICD-10-CM | POA: Diagnosis not present

## 2017-08-29 DIAGNOSIS — Z8501 Personal history of malignant neoplasm of esophagus: Secondary | ICD-10-CM | POA: Diagnosis not present

## 2017-08-29 DIAGNOSIS — Z6831 Body mass index (BMI) 31.0-31.9, adult: Secondary | ICD-10-CM | POA: Diagnosis not present

## 2017-08-29 DIAGNOSIS — E1122 Type 2 diabetes mellitus with diabetic chronic kidney disease: Secondary | ICD-10-CM | POA: Diagnosis not present

## 2017-08-29 DIAGNOSIS — Z7982 Long term (current) use of aspirin: Secondary | ICD-10-CM | POA: Diagnosis not present

## 2017-08-29 DIAGNOSIS — I129 Hypertensive chronic kidney disease with stage 1 through stage 4 chronic kidney disease, or unspecified chronic kidney disease: Secondary | ICD-10-CM | POA: Diagnosis not present

## 2017-08-29 DIAGNOSIS — Z8673 Personal history of transient ischemic attack (TIA), and cerebral infarction without residual deficits: Secondary | ICD-10-CM | POA: Diagnosis not present

## 2017-08-29 DIAGNOSIS — K22711 Barrett's esophagus with high grade dysplasia: Secondary | ICD-10-CM | POA: Diagnosis not present

## 2017-08-29 DIAGNOSIS — Z794 Long term (current) use of insulin: Secondary | ICD-10-CM | POA: Diagnosis not present

## 2017-08-29 DIAGNOSIS — Z8546 Personal history of malignant neoplasm of prostate: Secondary | ICD-10-CM | POA: Diagnosis not present

## 2017-08-29 DIAGNOSIS — Z955 Presence of coronary angioplasty implant and graft: Secondary | ICD-10-CM | POA: Diagnosis not present

## 2017-09-22 ENCOUNTER — Encounter: Payer: Self-pay | Admitting: *Deleted

## 2017-09-22 ENCOUNTER — Emergency Department
Admission: EM | Admit: 2017-09-22 | Discharge: 2017-09-22 | Disposition: A | Discharge status: Home / Self Care | Payer: Medicare Other | Attending: Emergency Medicine | Admitting: Emergency Medicine

## 2017-09-22 DIAGNOSIS — Z79899 Other long term (current) drug therapy: Secondary | ICD-10-CM | POA: Diagnosis not present

## 2017-09-22 DIAGNOSIS — Z91010 Allergy to peanuts: Secondary | ICD-10-CM | POA: Insufficient documentation

## 2017-09-22 DIAGNOSIS — Z8673 Personal history of transient ischemic attack (TIA), and cerebral infarction without residual deficits: Secondary | ICD-10-CM | POA: Diagnosis not present

## 2017-09-22 DIAGNOSIS — E119 Type 2 diabetes mellitus without complications: Secondary | ICD-10-CM | POA: Insufficient documentation

## 2017-09-22 DIAGNOSIS — M79604 Pain in right leg: Secondary | ICD-10-CM | POA: Diagnosis not present

## 2017-09-22 DIAGNOSIS — Z794 Long term (current) use of insulin: Secondary | ICD-10-CM | POA: Insufficient documentation

## 2017-09-22 DIAGNOSIS — Z8546 Personal history of malignant neoplasm of prostate: Secondary | ICD-10-CM | POA: Insufficient documentation

## 2017-09-22 DIAGNOSIS — Z7982 Long term (current) use of aspirin: Secondary | ICD-10-CM | POA: Insufficient documentation

## 2017-09-22 DIAGNOSIS — I10 Essential (primary) hypertension: Secondary | ICD-10-CM | POA: Diagnosis not present

## 2017-09-22 DIAGNOSIS — Z87891 Personal history of nicotine dependence: Secondary | ICD-10-CM | POA: Diagnosis not present

## 2017-09-22 LAB — CBC
HCT: 35.9 % — ABNORMAL LOW (ref 40.0–52.0)
Hemoglobin: 11.9 g/dL — ABNORMAL LOW (ref 13.0–18.0)
MCH: 27.9 pg (ref 26.0–34.0)
MCHC: 33.2 g/dL (ref 32.0–36.0)
MCV: 84 fL (ref 80.0–100.0)
PLATELETS: 299 10*3/uL (ref 150–440)
RBC: 4.27 MIL/uL — ABNORMAL LOW (ref 4.40–5.90)
RDW: 16.4 % — AB (ref 11.5–14.5)
WBC: 11.6 10*3/uL — AB (ref 3.8–10.6)

## 2017-09-22 LAB — COMPREHENSIVE METABOLIC PANEL
ALBUMIN: 4.5 g/dL (ref 3.5–5.0)
ALT: 17 U/L (ref 17–63)
AST: 28 U/L (ref 15–41)
Alkaline Phosphatase: 85 U/L (ref 38–126)
Anion gap: 12 (ref 5–15)
BUN: 43 mg/dL — AB (ref 6–20)
CHLORIDE: 105 mmol/L (ref 101–111)
CO2: 21 mmol/L — ABNORMAL LOW (ref 22–32)
Calcium: 9.4 mg/dL (ref 8.9–10.3)
Creatinine, Ser: 1.96 mg/dL — ABNORMAL HIGH (ref 0.61–1.24)
GFR calc Af Amer: 36 mL/min — ABNORMAL LOW (ref >60)
GFR calc non Af Amer: 31 mL/min — ABNORMAL LOW (ref >60)
GLUCOSE: 171 mg/dL — AB (ref 65–99)
POTASSIUM: 4.5 mmol/L (ref 3.5–5.1)
SODIUM: 138 mmol/L (ref 135–145)
Total Bilirubin: 0.4 mg/dL (ref 0.3–1.2)
Total Protein: 7.9 g/dL (ref 6.5–8.1)

## 2017-09-22 LAB — LACTIC ACID, PLASMA: LACTIC ACID, VENOUS: 1.7 mmol/L (ref 0.5–1.9)

## 2017-09-22 MED ORDER — MORPHINE SULFATE (PF) 4 MG/ML IV SOLN
4.0000 mg | Freq: Once | INTRAVENOUS | Status: AC
  Administered 2017-09-22: 4 mg via INTRAVENOUS
  Filled 2017-09-22: qty 1

## 2017-09-22 MED ORDER — OXYCODONE-ACETAMINOPHEN 5-325 MG PO TABS: 1.0000 | ORAL_TABLET | Freq: Four times a day (QID) | ORAL | 0 refills | Status: DC | PRN

## 2017-09-22 MED ORDER — ONDANSETRON HCL 4 MG/2ML IJ SOLN
4.0000 mg | Freq: Once | INTRAMUSCULAR | Status: AC
  Administered 2017-09-22: 4 mg via INTRAVENOUS
  Filled 2017-09-22: qty 2

## 2017-09-22 NOTE — ED Provider Notes (Signed)
[facility] Emergency Department Provider Note  Time seen: 9:31 AM  I have reviewed the triage vital signs and the nursing notes.   HISTORY  Chief Complaint Leg Pain    HPI Mike Decker is a 76 y.o. male With a past medical history of arthritis, diabetes, hypertension, hyperlipidemia, MI, peripheral artery disease with history of right superficial femoral artery stent, presents to the emergency department for right thigh pain. According to the patient for the past 3 days he has been expressing progressively worsening right thigh pain. Patient states he just kept him up for the past 2 nights he has not been able to sleep due to the pain in his right thigh. States the pain is somewhat worse with walking but hurts even at rest. Patient states this feels somewhat similar to 2015 when he required a stent. Denies any fever, abdominal pain, nausea vomiting.  Past Medical History:  Diagnosis Date  . Arthritis   . Atrioventricular canal (AVC)    irregular heart beats  . Barrett esophagus   . Cancer [facility]) 2002   prostate  . Colon polyp   . Diabetes mellitus without complication ([place])   . Diverticulosis   . Gout   . Heart disease   . Hemangioma    liver  . Hyperlipidemia   . Hypertension   . Myocardial infarct ([place])   . Ocular hypertension   . Peripheral vascular disease ([place])   . Skin cancer   . Sleep apnea   . Vitreoretinal degeneration     Patient Active Problem List   Diagnosis Date Noted  . NSTEMI (non-ST elevated myocardial infarction) ([place]) 05/06/2017  . PAD (peripheral artery disease) ([place]) 05/01/2017  . Barrett's esophagus 10/02/2016  . Carotid stenosis 10/02/2016  . Essential hypertension 10/02/2016  . Hyperlipidemia 10/02/2016  . Diabetes ([place]) 10/02/2016  . TIA (transient ischemic attack) 10/08/2015    Past Surgical History:  Procedure Laterality Date  . CATARACT EXTRACTION  2011, 2012  . CORONARY ANGIOPLASTY WITH STENT PLACEMENT   2012  . ESOPHAGOGASTRODUODENOSCOPY (EGD) WITH PROPOFOL N/A 06/01/2016   Procedure: ESOPHAGOGASTRODUODENOSCOPY (EGD) WITH PROPOFOL;  Surgeon: Manya Silvas, MD;  Location: [facility] ENDOSCOPY;  Service: Endoscopy;  Laterality: N/A;  . HERNIA REPAIR     x2  . INTRAOCULAR LENS INSERTION    . LEFT HEART CATH AND CORONARY ANGIOGRAPHY N/A 05/09/2017   Procedure: Left Heart Cath and Coronary Angiography;  Surgeon: Yolonda Kida, MD;  Location: [facility] LAB;  Service: Cardiovascular;  Laterality: N/A;  . NOSE SURGERY     submucous resection  . PROSTATE SURGERY  2002  . ROTATOR CUFF REPAIR Right     Prior to Admission medications   Medication Sig Start Date End Date Taking? Authorizing Provider  acetaminophen (TYLENOL) 500 MG tablet Take 1,000 mg by mouth every 6 (six) hours as needed for mild pain, moderate pain or headache.    [provider]  allopurinol (ZYLOPRIM) 100 MG tablet Take 200 mg by mouth 2 (two) times daily.     [provider]  amLODipine (NORVASC) 5 MG tablet TAKE 1 TABLET (5 MG TOTAL) BY MOUTH 2 (TWO) TIMES DAILY. 04/02/17   [provider]  aspirin (ASPIRIN CHILDRENS) 81 MG chewable tablet Chew 1 tablet (81 mg total) by mouth daily. 10/09/15   Bettey Costa, MD  budesonide-formoterol (SYMBICORT) 160-4.5 MCG/ACT inhaler Inhale 2 puffs into the lungs 2 (two) times daily.    [provider]  CARAFATE 1 GM/10ML  suspension TAKE 2 TEASPOONSFUL (10MLS) BY MOUTH 4 TIMES A DAY BEFORE MEALS AND NIGHTLY FOR 14 DAYS 03/17/17   [provider]  cetirizine (ZYRTEC) 5 MG tablet Take 5 mg by mouth daily as needed for allergies or rhinitis.     [provider]  Cholecalciferol (VITAMIN D3) 50000 UNITS CAPS Take 50,000 Units by mouth every 30 (thirty) days.    [provider]  citalopram (CELEXA) 10 MG tablet Take 10 mg by mouth daily.    [provider]  clopidogrel (PLAVIX) 75 MG tablet Take 1 tablet (75 mg total) by mouth  daily. 10/09/15   Bettey Costa, MD  colchicine 0.6 MG tablet Take 0.6 mg by mouth 3 (three) times daily.    [provider]  dextromethorphan-guaiFENesin (MUCINEX DM) 30-600 MG 12hr tablet Take 1 tablet by mouth 2 (two) times daily as needed for cough.    [provider]  EPINEPHrine (EPI-PEN) 0.3 mg/0.3 mL SOAJ injection Inject into the muscle as needed (anaphylaxis).     [provider]  furosemide (LASIX) 20 MG tablet Take 2 tablets (40 mg total) by mouth 2 (two) times daily. 05/15/17   Hillary Bow, MD  insulin aspart (NOVOLOG FLEXPEN) 100 UNIT/ML FlexPen Inject 14-20 Units into the skin See admin instructions. Inject 14 units every morning with breakfast and follow sliding scale for the rest of meals. 08/30/15   [provider]  insulin glargine (LANTUS) 100 UNIT/ML injection Inject 55 Units into the skin at bedtime.     [provider]  ipratropium-albuterol (DUONEB) 0.5-2.5 (3) MG/3ML SOLN Take 3 mLs by nebulization every 4 (four) hours as needed. 05/15/17   Hillary Bow, MD  isosorbide mononitrate (IMDUR) 120 MG 24 hr tablet Take 1 tablet (120 mg total) by mouth daily. Patient taking differently: Take 60 mg by mouth daily.  10/09/15   Bettey Costa, MD  lisinopril (PRINIVIL,ZESTRIL) 10 MG tablet Take 10 mg by mouth daily.    [provider]  metoprolol tartrate 75 MG TABS Take 75 mg by mouth 2 (two) times daily. 05/15/17   Hillary Bow, MD  nitroGLYCERIN (NITROSTAT) 0.4 MG SL tablet Place 0.4 mg under the tongue every 5 (five) minutes x 3 doses as needed for chest pain.     [provider]  pantoprazole (PROTONIX) 40 MG tablet Take 40 mg by mouth 2 (two) times daily.    [provider]  polyethylene glycol powder (GLYCOLAX/MIRALAX) powder Use as directed 01/20/15   [provider]  rosuvastatin (CRESTOR) 40 MG tablet Take 40 mg by mouth daily.    [provider]    Allergies  Allergen Reactions  .  Peanut-Containing Drug Products Anaphylaxis  . Penicillins Hives and Rash    Has patient had a PCN reaction causing immediate rash, facial/tongue/throat swelling, SOB or lightheadedness with hypotension: Yes Has patient had a PCN reaction causing severe rash involving mucus membranes or skin necrosis: No Has patient had a PCN reaction that required hospitalization No Has patient had a PCN reaction occurring within the last 10 years: No If all of the above answers are "NO", then may proceed with Cephalosporin use.  . Bee Venom Swelling  . Influenza Vaccines Hives  . Inh [Isoniazid] Hives  . Kenalog [Triamcinolone Acetonide] Hives  . Nalfon [Fenoprofen Calcium] Hives    Family History  Problem Relation Age of Onset  . Stroke Maternal Grandfather     Social History Social History  Substance Use Topics  . Smoking status:  Former Smoker    Years: 20.00    Types: Cigarettes    Quit date: 12/10/1976  . Smokeless tobacco: Never Used  . Alcohol use No    Review of Systems Constitutional: Negative for fever. Cardiovascular: Negative for chest pain. Respiratory: Negative for shortness of breath. Gastrointestinal: Negative for abdominal pain, vomiting  Musculoskeletal: right thigh pain Neurological: Negative for headache All other ROS negative  ____________________________________________   PHYSICAL EXAM:  VITAL SIGNS: ED Triage Vitals [09/22/17 0904]  Enc Vitals Group     BP (!) 162/68     Pulse Rate 65     Resp      Temp 98.2 F (36.8 C)     Temp Source Oral     SpO2 100 %     Weight 180 lb (81.6 kg)     Height 5\' 7"  (1.702 m)     Head Circumference      Peak Flow      Pain Score 8     Pain Loc      Pain Edu?      Excl. in [place]?     Constitutional: Alert and oriented. Well appearing and in no distress. Eyes: Normal exam ENT   Head: Normocephalic and atraumatic.   Mouth/Throat: Mucous membranes are moist. Cardiovascular: Normal rate, regular rhythm. No  murmur Respiratory: Normal respiratory effort without tachypnea nor retractions. Breath sounds are clear  Gastrointestinal: Soft and nontender. No distention.   Musculoskeletal: Nontender with normal range of motion in all extremities. warm distal extremities, no thigh tenderness. Unable to palpate pulses bilaterally but strong Doppler pulse. no paleness or darkness of the skin. Normal in appearance. Neurologic:  Normal speech and language. No gross focal neurologic deficits  Skin:  Skin is warm, dry and intact.  Psychiatric: Mood and affect are normal.  ____________________________________________   INITIAL IMPRESSION / ASSESSMENT AND PLAN / ED COURSE  Pertinent labs & imaging results that were available during my care of the patient were reviewed by me and considered in my medical decision making (see chart for details).  patient presents to the emergency department for right thigh pain over the past 3 days. States it has progressively worsened and feels somewhat similar to it did in 2015 when he required a stent. Differential this time would include arterial occlusion, claudication, musculoskeletal pain, muscle strain. I reviewed the patient's records in 2015 he had a right superficial femoral artery stent placed by Dr. dew.  we will check labs including a lactic acid. We will continue to closely monitor the patient in the emergency department. We will control pain and discussed with vascular surgery.  patient's labs show a slight leukocytosis. Lactate of 1.7. Creatinine is elevated 1.9 close to baseline around 1.8.I discussed the patient with Dr.Brabham, who states as the patient does not have an ischemic limb he is safe for discharge home and will see him in the office tomorrow for a possible angiography if needed. Patient states his pain is minimal at this time after pain medication. We'll discharge with Percocet for pain control the patient will call vascular surgery tomorrow  morning.  ____________________________________________   FINAL CLINICAL IMPRESSION(S) / ED DIAGNOSES  right leg pain    Harvest Dark, MD 09/22/17 1030

## 2017-09-22 NOTE — Discharge Instructions (Signed)
as we discussed please call the number provided for vascular surgery tomorrow morning to arrange a follow-up appointment hopefully tomorrow afternoon. Please take your pain medication as needed, as written. As we discussed return immediately to the emergency department for any significant worsening of pain, any color changes of the legs such as purple or white in color, or coldness to the leg.  When you call vascular surgery tomorrow please tell them you were seen in the emergency department and they recommended that he be seen tomorrow in the office.

## 2017-09-22 NOTE — ED Triage Notes (Signed)
PT to ED reporting 8/10 right leg pain that began Friday. PT reports pain is localized to right thigh. Pt had a stent placed in 2015 and reports this a similar pain as to the pain he felt before placement of the stent. PT reports increased numbness noted to the right leg but also verbalized hx of diabetes and reports "intermittent numbness with my diabetes"   Pt reports he is able to ambulate and denies falls since pain began.

## 2017-09-24 ENCOUNTER — Ambulatory Visit (INDEPENDENT_AMBULATORY_CARE_PROVIDER_SITE_OTHER): Payer: Medicare Other | Admitting: Vascular Surgery

## 2017-09-24 ENCOUNTER — Ambulatory Visit (INDEPENDENT_AMBULATORY_CARE_PROVIDER_SITE_OTHER): Payer: Medicare Other

## 2017-09-24 ENCOUNTER — Encounter (INDEPENDENT_AMBULATORY_CARE_PROVIDER_SITE_OTHER): Payer: Self-pay | Admitting: Vascular Surgery

## 2017-09-24 VITALS — BP 149/60 | HR 56 | Resp 17 | Ht 67.0 in | Wt 188.0 lb

## 2017-09-24 DIAGNOSIS — M79651 Pain in right thigh: Secondary | ICD-10-CM

## 2017-09-24 DIAGNOSIS — I6523 Occlusion and stenosis of bilateral carotid arteries: Secondary | ICD-10-CM

## 2017-09-24 DIAGNOSIS — E785 Hyperlipidemia, unspecified: Secondary | ICD-10-CM | POA: Diagnosis not present

## 2017-09-24 DIAGNOSIS — I739 Peripheral vascular disease, unspecified: Secondary | ICD-10-CM

## 2017-09-24 DIAGNOSIS — I10 Essential (primary) hypertension: Secondary | ICD-10-CM

## 2017-09-24 DIAGNOSIS — E118 Type 2 diabetes mellitus with unspecified complications: Secondary | ICD-10-CM

## 2017-09-24 DIAGNOSIS — M79609 Pain in unspecified limb: Secondary | ICD-10-CM | POA: Insufficient documentation

## 2017-09-24 NOTE — Assessment & Plan Note (Signed)
Given his previous history of vascular disease, noninvasive studies were performed today.  His right ABI was 1.07 and his left ABI was 0.95 with brisk triphasic waveforms consistent with no arterial insufficiency.  I do not believe his current leg pain secondary to poor perfusion.  We discussed a referral to orthopedics for further evaluation of his right thigh pain.  He will keep his scheduled follow-up visit for next spring.

## 2017-09-24 NOTE — Progress Notes (Signed)
MRN : [id number]  Mike Decker is a 76 y.o. (1941/04/09) male who presents with chief complaint of  Chief Complaint  Patient presents with  . Follow-up    Patient request-ER visit on 10/13  .  History of Present Illness: Patient returns today in follow up prior to his scheduled visit due to acute right thigh pain.  This is different than any pain he has ever had.  He is status post right lower extremity intervention about 3-4 years ago and the pain is different than it was at that time.  It is much more severe.  At times, it has been a 10 out of 10 in terms of pain.  He reports no trauma or injury.  He has had some sciatica and other.  He denies ulceration or infection.  No discoloration of the leg.  No bruising or swelling appreciable.  There was no clear cause or inciting event, but this started abruptly about 48-72 hours ago.  In the ER, he was referred to come back here.  Current Outpatient Prescriptions  Medication Sig Dispense Refill  . acetaminophen (TYLENOL) 500 MG tablet Take 1,000 mg by mouth every 6 (six) hours as needed for mild pain, moderate pain or headache.    . allopurinol (ZYLOPRIM) 100 MG tablet Take 200 mg by mouth 2 (two) times daily.     Marland Kitchen amLODipine (NORVASC) 5 MG tablet TAKE 1 TABLET (5 MG TOTAL) BY MOUTH 2 (TWO) TIMES DAILY.  5  . aspirin (ASPIRIN CHILDRENS) 81 MG chewable tablet Chew 1 tablet (81 mg total) by mouth daily. 120 tablet 0  . budesonide-formoterol (SYMBICORT) 160-4.5 MCG/ACT inhaler Inhale 2 puffs into the lungs 2 (two) times daily.    Marland Kitchen CARAFATE 1 GM/10ML suspension TAKE 2 TEASPOONSFUL (10MLS) BY MOUTH 4 TIMES A DAY BEFORE MEALS AND NIGHTLY FOR 14 DAYS  2  . cetirizine (ZYRTEC) 5 MG tablet Take 5 mg by mouth daily as needed for allergies or rhinitis.     . Cholecalciferol (VITAMIN D3) 50000 UNITS CAPS Take 50,000 Units by mouth every 30 (thirty) days.    . citalopram (CELEXA) 10 MG tablet Take 10 mg by mouth daily.    . clopidogrel (PLAVIX) 75 MG  tablet Take 1 tablet (75 mg total) by mouth daily. 30 tablet 0  . colchicine 0.6 MG tablet Take 0.6 mg by mouth 3 (three) times daily.    . Dimethyl Ether (COMPOUND W COMPLETE) KIT Take 15 mL three times daily before meals for the first 2 days following ablation treatment and then every 4 hours ONLY as needed thereafter.    Marland Kitchen EPINEPHrine (EPI-PEN) 0.3 mg/0.3 mL SOAJ injection Inject into the muscle as needed (anaphylaxis).     . furosemide (LASIX) 20 MG tablet Take 2 tablets (40 mg total) by mouth 2 (two) times daily. 60 tablet 0  . gabapentin (NEURONTIN) 100 MG capsule TAKE 1 CAPSULE THREE TIMES DAILY FOR 1 WEEK, THEN INCREASE TO 2 CAPSULES THREE TIMES DAILY  3  . insulin aspart (NOVOLOG FLEXPEN) 100 UNIT/ML FlexPen Inject 14-20 Units into the skin See admin instructions. Inject 14 units every morning with breakfast and follow sliding scale for the rest of meals.    . insulin glargine (LANTUS) 100 UNIT/ML injection Inject 55 Units into the skin at bedtime.     Marland Kitchen ipratropium-albuterol (DUONEB) 0.5-2.5 (3) MG/3ML SOLN Take 3 mLs by nebulization every 4 (four) hours as needed. 360 mL 0  . isosorbide mononitrate (IMDUR) 120 MG  24 hr tablet Take 1 tablet (120 mg total) by mouth daily. (Patient taking differently: Take 60 mg by mouth daily. ) 30 tablet 0  . lisinopril (PRINIVIL,ZESTRIL) 10 MG tablet Take 10 mg by mouth daily.    . metoprolol tartrate 75 MG TABS Take 75 mg by mouth 2 (two) times daily. 60 tablet 0  . nitroGLYCERIN (NITROSTAT) 0.4 MG SL tablet Place 0.4 mg under the tongue every 5 (five) minutes x 3 doses as needed for chest pain.     Marland Kitchen oxyCODONE (ROXICODONE) 5 MG/5ML solution Take by mouth.    . oxyCODONE-acetaminophen (ROXICET) 5-325 MG tablet Take 1 tablet by mouth every 6 (six) hours as needed. 20 tablet 0  . pantoprazole (PROTONIX) 40 MG tablet Take 40 mg by mouth 2 (two) times daily.    . polyethylene glycol powder (GLYCOLAX/MIRALAX) powder Use as directed    . rosuvastatin (CRESTOR)  40 MG tablet Take 40 mg by mouth daily.    . Vitamin D, Ergocalciferol, (DRISDOL) 50000 units CAPS capsule TAKE 1 CAPSULE BY MOUTH ONCE A MONTH  1  . chlorpheniramine-HYDROcodone (TUSSIONEX) 10-8 MG/5ML SUER TAKE 1/2 TEASPOONFUL BY MOUTH TWICE A DAY  0  . dextromethorphan-guaiFENesin (MUCINEX DM) 30-600 MG 12hr tablet Take 1 tablet by mouth 2 (two) times daily as needed for cough.     No current facility-administered medications for this visit.     Past Medical History:  Diagnosis Date  . Arthritis   . Atrioventricular canal (AVC)    irregular heart beats  . Barrett esophagus   . Cancer [facility]) 2002   prostate  . Colon polyp   . Diabetes mellitus without complication ([place])   . Diverticulosis   . Gout   . Heart disease   . Hemangioma    liver  . Hyperlipidemia   . Hypertension   . Myocardial infarct ([place])   . Ocular hypertension   . Peripheral vascular disease ([place])   . Skin cancer   . Sleep apnea   . Vitreoretinal degeneration     Past Surgical History:  Procedure Laterality Date  . CATARACT EXTRACTION  2011, 2012  . CORONARY ANGIOPLASTY WITH STENT PLACEMENT  2012  . ESOPHAGOGASTRODUODENOSCOPY (EGD) WITH PROPOFOL N/A 06/01/2016   Procedure: ESOPHAGOGASTRODUODENOSCOPY (EGD) WITH PROPOFOL;  Surgeon: Manya Silvas, MD;  Location: [facility] ENDOSCOPY;  Service: Endoscopy;  Laterality: N/A;  . HERNIA REPAIR     x2  . INTRAOCULAR LENS INSERTION    . LEFT HEART CATH AND CORONARY ANGIOGRAPHY N/A 05/09/2017   Procedure: Left Heart Cath and Coronary Angiography;  Surgeon: Yolonda Kida, MD;  Location: [facility] CV LAB;  Service: Cardiovascular;  Laterality: N/A;  . NOSE SURGERY     submucous resection  . PROSTATE SURGERY  2002  . ROTATOR CUFF REPAIR Right      Social History      Social History  Substance Use Topics  . Smoking status: Former Smoker    Years: 20.00    Types: Cigarettes    Quit date: 12/10/1976  . Smokeless tobacco: Never Used  . Alcohol use No    NO IVDU   Family History      Family History  Problem Relation Age of Onset  . Stroke Maternal Grandfather   No bleeding disorders, clotting disorders, or aneurysms   Allergies  Allergen Reactions  . Peanut-Containing Drug Products Anaphylaxis  . Penicillins Hives and Rash    Has patient had a PCN reaction causing immediate rash, facial/tongue/throat swelling,  SOB or lightheadedness with hypotension: Yes Has patient had a PCN reaction causing severe rash involving mucus membranes or skin necrosis: No Has patient had a PCN reaction that required hospitalization No Has patient had a PCN reaction occurring within the last 10 years: No If all of the above answers are "NO", then may proceed with Cephalosporin use.  . Bee Venom Swelling  . Influenza Vaccines Hives  . Inh [Isoniazid] Hives  . Kenalog [Triamcinolone Acetonide] Hives  . Nalfon [Fenoprofen Calcium] Hives     REVIEW OF SYSTEMS (Negative unless checked)  Constitutional: '[]' Weight loss  '[]' Fever  '[]' Chills Cardiac: '[]' Chest pain   '[]' Chest pressure   '[]' Palpitations   '[]' Shortness of breath when laying flat   '[]' Shortness of breath at rest   '[]' Shortness of breath with exertion. Vascular:  '[x]' Pain in legs with walking   '[x]' Pain in legs at rest   '[]' Pain in legs when laying flat   '[]' Claudication   '[]' Pain in feet when walking  '[]' Pain in feet at rest  '[]' Pain in feet when laying flat   '[]' History of DVT   '[]' Phlebitis   '[]' Swelling in legs   '[]' Varicose veins   '[]' Non-healing ulcers Pulmonary:   '[]' Uses home oxygen   '[]' Productive cough   '[]' Hemoptysis   '[]' Wheeze  '[]' COPD   '[]' Asthma Neurologic:  '[]' Dizziness  '[]' Blackouts   '[]' Seizures   '[]' History of stroke   '[x]' History of TIA  '[]' Aphasia   '[]' Temporary blindness   '[]' Dysphagia   '[]' Weakness or numbness in arms   '[x]' Weakness or numbness in legs Musculoskeletal:  '[x]' Arthritis   '[]' Joint swelling   '[]' Joint pain   '[]' Low back pain Hematologic:  '[]' Easy bruising  '[]' Easy bleeding   '[]' Hypercoagulable  state   '[]' Anemic  '[]' Hepatitis Gastrointestinal:  '[]' Blood in stool   '[]' Vomiting blood  '[x]' Gastroesophageal reflux/heartburn   '[x]' Difficulty swallowing. Genitourinary:  '[]' Chronic kidney disease   '[]' Difficult urination  '[]' Frequent urination  '[]' Burning with urination   '[]' Blood in urine Skin:  '[]' Rashes   '[]' Ulcers   '[]' Wounds Psychological:  '[]' History of anxiety   '[]'  History of major depression.    Physical Examination  BP (!) 149/60 (BP Location: Right Arm)   Pulse (!) 56   Resp 17   Ht '5\' 7"'  (1.702 m)   Wt 188 lb (85.3 kg)   BMI 29.44 kg/m  Gen:  WD/WN, NAD Head: [place]/AT, No temporalis wasting. Ear/Nose/Throat: Hearing grossly intact, nares w/o erythema or drainage, trachea midline Eyes: Conjunctiva clear. Sclera non-icteric Neck: Supple.  No JVD.  Pulmonary:  Good air movement, no use of accessory muscles.  Cardiac: RRR, normal S1, S2 Vascular:  Vessel Right Left  Radial Palpable Palpable                          PT Palpable  1+ palpable  DP  1+ palpable Palpable    Musculoskeletal: M/S 5/5 throughout.  No deformity or atrophy.  No edema. Neurologic: Sensation grossly intact in extremities.  Symmetrical.  Speech is fluent.  Psychiatric: Judgment intact, Mood & affect appropriate for pt's clinical situation. Dermatologic: No rashes or ulcers noted.  No cellulitis or open wounds. Lymph : No Cervical, Axillary, or Inguinal lymphadenopathy.      Labs Recent Results (from the past 2160 hour(s))  CBC     Status: Abnormal   Collection Time: 09/22/17  9:23 AM  Result Value Ref Range   WBC 11.6 (H) 3.8 - 10.6 K/uL   RBC 4.27 (L) 4.40 - 5.90 MIL/uL  Hemoglobin 11.9 (L) 13.0 - 18.0 g/dL   HCT 35.9 (L) 40.0 - 52.0 %   MCV 84.0 80.0 - 100.0 fL   MCH 27.9 26.0 - 34.0 pg   MCHC 33.2 32.0 - 36.0 g/dL   RDW 16.4 (H) 11.5 - 14.5 %   Platelets 299 150 - 440 K/uL  Comprehensive metabolic panel     Status: Abnormal   Collection Time: 09/22/17  9:23 AM  Result Value Ref Range    Sodium 138 135 - 145 mmol/L   Potassium 4.5 3.5 - 5.1 mmol/L    Comment: HEMOLYSIS AT THIS LEVEL MAY AFFECT RESULT   Chloride 105 101 - 111 mmol/L   CO2 21 (L) 22 - 32 mmol/L   Glucose, Bld 171 (H) 65 - 99 mg/dL   BUN 43 (H) 6 - 20 mg/dL   Creatinine, Ser 1.96 (H) 0.61 - 1.24 mg/dL   Calcium 9.4 8.9 - 10.3 mg/dL   Total Protein 7.9 6.5 - 8.1 g/dL   Albumin 4.5 3.5 - 5.0 g/dL   AST 28 15 - 41 U/L   ALT 17 17 - 63 U/L   Alkaline Phosphatase 85 38 - 126 U/L   Total Bilirubin 0.4 0.3 - 1.2 mg/dL   GFR calc non Af Amer 31 (L) >60 mL/min   GFR calc Af Amer 36 (L) >60 mL/min    Comment: (NOTE) The eGFR has been calculated using the CKD EPI equation. This calculation has not been validated in all clinical situations. eGFR's persistently <60 mL/min signify possible Chronic Kidney Disease.    Anion gap 12 5 - 15  Lactic acid, plasma     Status: None   Collection Time: 09/22/17  9:23 AM  Result Value Ref Range   Lactic Acid, Venous 1.7 0.5 - 1.9 mmol/L    Radiology No results found.  Assessment/Plan Diabetes (HCC) blood glucose control important in reducing the progression of atherosclerotic disease. Also, involved in wound healing. On appropriate medications.   Essential hypertension blood pressure control important in reducing the progression of atherosclerotic disease. On appropriate oral medications.   Hyperlipidemia lipid control important in reducing the progression of atherosclerotic disease. Continue statin therapy  PAD (peripheral artery disease) (HCC) Given his previous history of vascular disease, noninvasive studies were performed today.  His right ABI was 1.07 and his left ABI was 0.95 with brisk triphasic waveforms consistent with no arterial insufficiency.  I do not believe his current leg pain secondary to poor perfusion.  We discussed a referral to orthopedics for further evaluation of his right thigh pain.  He will keep his scheduled follow-up visit for next  spring.  Pain in limb Given his previous history of vascular disease, noninvasive studies were performed today.  His right ABI was 1.07 and his left ABI was 0.95 with brisk triphasic waveforms consistent with no arterial insufficiency.  I do not believe his current leg pain secondary to poor perfusion.  We discussed a referral to orthopedics for further evaluation of his right thigh pain.    Leotis Pain, MD  09/24/2017 12:35 PM    This note was created with Dragon medical transcription system.  Any errors from dictation are purely unintentional

## 2017-09-24 NOTE — Patient Instructions (Signed)

## 2017-09-24 NOTE — Assessment & Plan Note (Signed)
Given his previous history of vascular disease, noninvasive studies were performed today.  His right ABI was 1.07 and his left ABI was 0.95 with brisk triphasic waveforms consistent with no arterial insufficiency.  I do not believe his current leg pain secondary to poor perfusion.  We discussed a referral to orthopedics for further evaluation of his right thigh pain.

## 2017-10-08 DIAGNOSIS — M533 Sacrococcygeal disorders, not elsewhere classified: Secondary | ICD-10-CM | POA: Insufficient documentation

## 2017-10-14 DIAGNOSIS — N2581 Secondary hyperparathyroidism of renal origin: Secondary | ICD-10-CM | POA: Diagnosis not present

## 2017-10-14 DIAGNOSIS — R809 Proteinuria, unspecified: Secondary | ICD-10-CM | POA: Diagnosis not present

## 2017-10-14 DIAGNOSIS — I129 Hypertensive chronic kidney disease with stage 1 through stage 4 chronic kidney disease, or unspecified chronic kidney disease: Secondary | ICD-10-CM | POA: Diagnosis not present

## 2017-10-14 DIAGNOSIS — N183 Chronic kidney disease, stage 3 (moderate): Secondary | ICD-10-CM | POA: Diagnosis not present

## 2017-10-14 DIAGNOSIS — E1122 Type 2 diabetes mellitus with diabetic chronic kidney disease: Secondary | ICD-10-CM | POA: Diagnosis not present

## 2017-10-17 DIAGNOSIS — E1165 Type 2 diabetes mellitus with hyperglycemia: Secondary | ICD-10-CM | POA: Diagnosis not present

## 2017-10-17 DIAGNOSIS — Z794 Long term (current) use of insulin: Secondary | ICD-10-CM | POA: Diagnosis not present

## 2017-10-24 DIAGNOSIS — D631 Anemia in chronic kidney disease: Secondary | ICD-10-CM | POA: Diagnosis not present

## 2017-10-24 DIAGNOSIS — R809 Proteinuria, unspecified: Secondary | ICD-10-CM | POA: Diagnosis not present

## 2017-10-24 DIAGNOSIS — N2581 Secondary hyperparathyroidism of renal origin: Secondary | ICD-10-CM | POA: Diagnosis not present

## 2017-10-24 DIAGNOSIS — N184 Chronic kidney disease, stage 4 (severe): Secondary | ICD-10-CM | POA: Diagnosis not present

## 2017-10-24 DIAGNOSIS — I129 Hypertensive chronic kidney disease with stage 1 through stage 4 chronic kidney disease, or unspecified chronic kidney disease: Secondary | ICD-10-CM | POA: Diagnosis not present

## 2017-10-24 DIAGNOSIS — E1122 Type 2 diabetes mellitus with diabetic chronic kidney disease: Secondary | ICD-10-CM | POA: Diagnosis not present

## 2017-10-25 DIAGNOSIS — E1142 Type 2 diabetes mellitus with diabetic polyneuropathy: Secondary | ICD-10-CM | POA: Diagnosis not present

## 2017-10-25 DIAGNOSIS — E1122 Type 2 diabetes mellitus with diabetic chronic kidney disease: Secondary | ICD-10-CM | POA: Diagnosis not present

## 2017-10-25 DIAGNOSIS — Z794 Long term (current) use of insulin: Secondary | ICD-10-CM | POA: Diagnosis not present

## 2017-10-25 DIAGNOSIS — E1159 Type 2 diabetes mellitus with other circulatory complications: Secondary | ICD-10-CM | POA: Diagnosis not present

## 2017-10-25 DIAGNOSIS — N184 Chronic kidney disease, stage 4 (severe): Secondary | ICD-10-CM | POA: Diagnosis not present

## 2017-11-21 DIAGNOSIS — I739 Peripheral vascular disease, unspecified: Secondary | ICD-10-CM | POA: Diagnosis not present

## 2017-11-21 DIAGNOSIS — M1A00X0 Idiopathic chronic gout, unspecified site, without tophus (tophi): Secondary | ICD-10-CM | POA: Diagnosis not present

## 2017-11-21 DIAGNOSIS — K219 Gastro-esophageal reflux disease without esophagitis: Secondary | ICD-10-CM | POA: Diagnosis not present

## 2017-11-21 DIAGNOSIS — I639 Cerebral infarction, unspecified: Secondary | ICD-10-CM | POA: Diagnosis not present

## 2017-11-21 DIAGNOSIS — I208 Other forms of angina pectoris: Secondary | ICD-10-CM | POA: Diagnosis not present

## 2017-11-21 DIAGNOSIS — J329 Chronic sinusitis, unspecified: Secondary | ICD-10-CM | POA: Diagnosis not present

## 2017-11-21 DIAGNOSIS — I10 Essential (primary) hypertension: Secondary | ICD-10-CM | POA: Diagnosis not present

## 2017-11-21 DIAGNOSIS — E7849 Other hyperlipidemia: Secondary | ICD-10-CM | POA: Diagnosis not present

## 2017-11-21 DIAGNOSIS — N182 Chronic kidney disease, stage 2 (mild): Secondary | ICD-10-CM | POA: Diagnosis not present

## 2017-11-21 DIAGNOSIS — E669 Obesity, unspecified: Secondary | ICD-10-CM | POA: Diagnosis not present

## 2017-11-21 DIAGNOSIS — I2510 Atherosclerotic heart disease of native coronary artery without angina pectoris: Secondary | ICD-10-CM | POA: Diagnosis not present

## 2017-11-21 DIAGNOSIS — R0602 Shortness of breath: Secondary | ICD-10-CM | POA: Diagnosis not present

## 2017-11-28 DIAGNOSIS — E1151 Type 2 diabetes mellitus with diabetic peripheral angiopathy without gangrene: Secondary | ICD-10-CM | POA: Diagnosis not present

## 2017-11-28 DIAGNOSIS — K228 Other specified diseases of esophagus: Secondary | ICD-10-CM | POA: Diagnosis not present

## 2017-11-28 DIAGNOSIS — I509 Heart failure, unspecified: Secondary | ICD-10-CM | POA: Diagnosis not present

## 2017-11-28 DIAGNOSIS — F419 Anxiety disorder, unspecified: Secondary | ICD-10-CM | POA: Diagnosis not present

## 2017-11-28 DIAGNOSIS — N189 Chronic kidney disease, unspecified: Secondary | ICD-10-CM | POA: Diagnosis not present

## 2017-11-28 DIAGNOSIS — K219 Gastro-esophageal reflux disease without esophagitis: Secondary | ICD-10-CM | POA: Diagnosis not present

## 2017-11-28 DIAGNOSIS — Z8673 Personal history of transient ischemic attack (TIA), and cerebral infarction without residual deficits: Secondary | ICD-10-CM | POA: Diagnosis not present

## 2017-11-28 DIAGNOSIS — Z7901 Long term (current) use of anticoagulants: Secondary | ICD-10-CM | POA: Diagnosis not present

## 2017-11-28 DIAGNOSIS — Z8546 Personal history of malignant neoplasm of prostate: Secondary | ICD-10-CM | POA: Diagnosis not present

## 2017-11-28 DIAGNOSIS — J449 Chronic obstructive pulmonary disease, unspecified: Secondary | ICD-10-CM | POA: Diagnosis not present

## 2017-11-28 DIAGNOSIS — I480 Paroxysmal atrial fibrillation: Secondary | ICD-10-CM | POA: Diagnosis not present

## 2017-11-28 DIAGNOSIS — Z6832 Body mass index (BMI) 32.0-32.9, adult: Secondary | ICD-10-CM | POA: Diagnosis not present

## 2017-11-28 DIAGNOSIS — Z86010 Personal history of colonic polyps: Secondary | ICD-10-CM | POA: Diagnosis not present

## 2017-11-28 DIAGNOSIS — E1122 Type 2 diabetes mellitus with diabetic chronic kidney disease: Secondary | ICD-10-CM | POA: Diagnosis not present

## 2017-11-28 DIAGNOSIS — Z85828 Personal history of other malignant neoplasm of skin: Secondary | ICD-10-CM | POA: Diagnosis not present

## 2017-11-28 DIAGNOSIS — Z09 Encounter for follow-up examination after completed treatment for conditions other than malignant neoplasm: Secondary | ICD-10-CM | POA: Diagnosis not present

## 2017-11-28 DIAGNOSIS — K449 Diaphragmatic hernia without obstruction or gangrene: Secondary | ICD-10-CM | POA: Diagnosis not present

## 2017-11-28 DIAGNOSIS — K2270 Barrett's esophagus without dysplasia: Secondary | ICD-10-CM | POA: Diagnosis not present

## 2017-11-28 DIAGNOSIS — Q402 Other specified congenital malformations of stomach: Secondary | ICD-10-CM | POA: Diagnosis not present

## 2017-11-28 DIAGNOSIS — Z794 Long term (current) use of insulin: Secondary | ICD-10-CM | POA: Diagnosis not present

## 2017-11-28 DIAGNOSIS — I5030 Unspecified diastolic (congestive) heart failure: Secondary | ICD-10-CM | POA: Diagnosis not present

## 2017-11-28 DIAGNOSIS — Z79899 Other long term (current) drug therapy: Secondary | ICD-10-CM | POA: Diagnosis not present

## 2017-11-28 DIAGNOSIS — Z955 Presence of coronary angioplasty implant and graft: Secondary | ICD-10-CM | POA: Diagnosis not present

## 2017-11-28 DIAGNOSIS — D001 Carcinoma in situ of esophagus: Secondary | ICD-10-CM | POA: Diagnosis not present

## 2017-11-28 DIAGNOSIS — I2510 Atherosclerotic heart disease of native coronary artery without angina pectoris: Secondary | ICD-10-CM | POA: Diagnosis not present

## 2017-11-28 DIAGNOSIS — I130 Hypertensive heart and chronic kidney disease with heart failure and stage 1 through stage 4 chronic kidney disease, or unspecified chronic kidney disease: Secondary | ICD-10-CM | POA: Diagnosis not present

## 2017-11-28 DIAGNOSIS — E669 Obesity, unspecified: Secondary | ICD-10-CM | POA: Diagnosis not present

## 2017-11-28 DIAGNOSIS — N183 Chronic kidney disease, stage 3 (moderate): Secondary | ICD-10-CM | POA: Diagnosis not present

## 2017-11-28 DIAGNOSIS — K22711 Barrett's esophagus with high grade dysplasia: Secondary | ICD-10-CM | POA: Diagnosis not present

## 2017-11-28 DIAGNOSIS — I252 Old myocardial infarction: Secondary | ICD-10-CM | POA: Diagnosis not present

## 2018-01-22 DIAGNOSIS — E1159 Type 2 diabetes mellitus with other circulatory complications: Secondary | ICD-10-CM | POA: Diagnosis not present

## 2018-01-28 DIAGNOSIS — E1122 Type 2 diabetes mellitus with diabetic chronic kidney disease: Secondary | ICD-10-CM | POA: Diagnosis not present

## 2018-01-28 DIAGNOSIS — E1159 Type 2 diabetes mellitus with other circulatory complications: Secondary | ICD-10-CM | POA: Diagnosis not present

## 2018-01-28 DIAGNOSIS — Z794 Long term (current) use of insulin: Secondary | ICD-10-CM | POA: Diagnosis not present

## 2018-01-28 DIAGNOSIS — N184 Chronic kidney disease, stage 4 (severe): Secondary | ICD-10-CM | POA: Diagnosis not present

## 2018-01-28 DIAGNOSIS — E1142 Type 2 diabetes mellitus with diabetic polyneuropathy: Secondary | ICD-10-CM | POA: Diagnosis not present

## 2018-02-06 ENCOUNTER — Ambulatory Visit: Payer: Self-pay | Admitting: Internal Medicine

## 2018-02-27 ENCOUNTER — Ambulatory Visit (INDEPENDENT_AMBULATORY_CARE_PROVIDER_SITE_OTHER): Payer: Medicare Other | Admitting: Internal Medicine

## 2018-02-27 ENCOUNTER — Encounter: Payer: Self-pay | Admitting: Internal Medicine

## 2018-02-27 VITALS — BP 136/64 | HR 52 | Resp 16 | Ht 67.0 in | Wt 197.0 lb

## 2018-02-27 DIAGNOSIS — J449 Chronic obstructive pulmonary disease, unspecified: Secondary | ICD-10-CM

## 2018-02-27 DIAGNOSIS — I2510 Atherosclerotic heart disease of native coronary artery without angina pectoris: Secondary | ICD-10-CM | POA: Insufficient documentation

## 2018-02-27 DIAGNOSIS — G4733 Obstructive sleep apnea (adult) (pediatric): Secondary | ICD-10-CM

## 2018-02-27 DIAGNOSIS — I2583 Coronary atherosclerosis due to lipid rich plaque: Secondary | ICD-10-CM | POA: Diagnosis not present

## 2018-02-27 DIAGNOSIS — K2100 Gastro-esophageal reflux disease with esophagitis, without bleeding: Secondary | ICD-10-CM

## 2018-02-27 NOTE — Patient Instructions (Signed)

## 2018-02-27 NOTE — Progress Notes (Signed)
[facility] [place], [place] [address]  Pulmonary Sleep Medicine  Office Visit Note  Patient Name: Mike Decker DOB: November 13, 1941 MRN [id number]  Date of Service: 02/27/2018  Complaints/HPI:  He is doing well he has had no admissions no flare-ups.  Patient is also using his CPAP device.  He is comfortable at this time has not had of follow-up with his gastroenterologist for his Barrett's esophagus.  In addition he follows with Cardiology for his coronary artery disease.  No chest pain no palpitations at this time.  ROS  General: (-) fever, (-) chills, (-) night sweats, (-) weakness Skin: (-) rashes, (-) itching,. Eyes: (-) visual changes, (-) redness, (-) itching. Nose and Sinuses: (-) nasal stuffiness or itchiness, (-) postnasal drip, (-) nosebleeds, (-) sinus trouble. Mouth and Throat: (-) sore throat, (-) hoarseness. Neck: (-) swollen glands, (-) enlarged thyroid, (-) neck pain. Respiratory: - cough, (-) bloody sputum, - shortness of breath, - wheezing. Cardiovascular: - ankle swelling, (-) chest pain. Lymphatic: (-) lymph node enlargement. Neurologic: (-) numbness, (-) tingling. Psychiatric: (-) anxiety, (-) depression   Current Medication: Outpatient Encounter Medications as of 02/27/2018  Medication Sig  . acetaminophen (TYLENOL) 500 MG tablet Take 1,000 mg by mouth every 6 (six) hours as needed for mild pain, moderate pain or headache.  . allopurinol (ZYLOPRIM) 100 MG tablet Take 200 mg by mouth 2 (two) times daily.   Marland Kitchen amLODipine (NORVASC) 5 MG tablet TAKE 1 TABLET (5 MG TOTAL) BY MOUTH 2 (TWO) TIMES DAILY.  Marland Kitchen aspirin (ASPIRIN CHILDRENS) 81 MG chewable tablet Chew 1 tablet (81 mg total) by mouth daily.  . budesonide-formoterol (SYMBICORT) 160-4.5 MCG/ACT inhaler Inhale 2 puffs into the lungs 2 (two) times daily.  Marland Kitchen CARAFATE 1 GM/10ML suspension TAKE 2 TEASPOONSFUL (10MLS) BY MOUTH 4 TIMES A DAY BEFORE MEALS AND NIGHTLY FOR 14 DAYS  . cetirizine  (ZYRTEC) 5 MG tablet Take 5 mg by mouth daily as needed for allergies or rhinitis.   . chlorpheniramine-HYDROcodone (TUSSIONEX) 10-8 MG/5ML SUER TAKE 1/2 TEASPOONFUL BY MOUTH TWICE A DAY  . Cholecalciferol (VITAMIN D3) 50000 UNITS CAPS Take 50,000 Units by mouth every 30 (thirty) days.  . citalopram (CELEXA) 10 MG tablet Take 10 mg by mouth daily.  . clopidogrel (PLAVIX) 75 MG tablet Take 1 tablet (75 mg total) by mouth daily.  . colchicine 0.6 MG tablet Take 0.6 mg by mouth 3 (three) times daily.  Marland Kitchen dextromethorphan-guaiFENesin (MUCINEX DM) 30-600 MG 12hr tablet Take 1 tablet by mouth 2 (two) times daily as needed for cough.  . Dimethyl Ether (COMPOUND W COMPLETE) KIT Take 15 mL three times daily before meals for the first 2 days following ablation treatment and then every 4 hours ONLY as needed thereafter.  Marland Kitchen EPINEPHrine (EPI-PEN) 0.3 mg/0.3 mL SOAJ injection Inject into the muscle as needed (anaphylaxis).   . furosemide (LASIX) 20 MG tablet Take 2 tablets (40 mg total) by mouth 2 (two) times daily.  Marland Kitchen gabapentin (NEURONTIN) 100 MG capsule TAKE 1 CAPSULE THREE TIMES DAILY FOR 1 WEEK, THEN INCREASE TO 2 CAPSULES THREE TIMES DAILY  . insulin aspart (NOVOLOG FLEXPEN) 100 UNIT/ML FlexPen Inject 14-20 Units into the skin See admin instructions. Inject 14 units every morning with breakfast and follow sliding scale for the rest of meals.  . insulin glargine (LANTUS) 100 UNIT/ML injection Inject 55 Units into the skin at bedtime.   Marland Kitchen ipratropium-albuterol (DUONEB) 0.5-2.5 (3) MG/3ML SOLN Take 3 mLs by nebulization every 4 (four)  hours as needed.  . isosorbide mononitrate (IMDUR) 120 MG 24 hr tablet Take 1 tablet (120 mg total) by mouth daily. (Patient taking differently: Take 60 mg by mouth daily. )  . lisinopril (PRINIVIL,ZESTRIL) 10 MG tablet Take 10 mg by mouth daily.  . metoprolol tartrate 75 MG TABS Take 75 mg by mouth 2 (two) times daily.  . nitroGLYCERIN (NITROSTAT) 0.4 MG SL tablet Place 0.4 mg  under the tongue every 5 (five) minutes x 3 doses as needed for chest pain.   Marland Kitchen oxyCODONE (ROXICODONE) 5 MG/5ML solution Take by mouth.  . oxyCODONE-acetaminophen (ROXICET) 5-325 MG tablet Take 1 tablet by mouth every 6 (six) hours as needed.  . pantoprazole (PROTONIX) 40 MG tablet Take 40 mg by mouth 2 (two) times daily.  . polyethylene glycol powder (GLYCOLAX/MIRALAX) powder Use as directed  . rosuvastatin (CRESTOR) 40 MG tablet Take 40 mg by mouth daily.  . Vitamin D, Ergocalciferol, (DRISDOL) 50000 units CAPS capsule TAKE 1 CAPSULE BY MOUTH ONCE A MONTH   No facility-administered encounter medications on file as of 02/27/2018.     Surgical History: Past Surgical History:  Procedure Laterality Date  . CATARACT EXTRACTION  2011, 2012  . CORONARY ANGIOPLASTY WITH STENT PLACEMENT  2012  . ESOPHAGOGASTRODUODENOSCOPY (EGD) WITH PROPOFOL N/A 06/01/2016   Procedure: ESOPHAGOGASTRODUODENOSCOPY (EGD) WITH PROPOFOL;  Surgeon: Manya Silvas, MD;  Location: [facility] ENDOSCOPY;  Service: Endoscopy;  Laterality: N/A;  . HERNIA REPAIR     x2  . INTRAOCULAR LENS INSERTION    . LEFT HEART CATH AND CORONARY ANGIOGRAPHY N/A 05/09/2017   Procedure: Left Heart Cath and Coronary Angiography;  Surgeon: Yolonda Kida, MD;  Location: [place] CV LAB;  Service: Cardiovascular;  Laterality: N/A;  . NOSE SURGERY     submucous resection  . PROSTATE SURGERY  2002  . ROTATOR CUFF REPAIR Right     Medical History: Past Medical History:  Diagnosis Date  . Arthritis   . Atrioventricular canal (AVC)    irregular heart beats  . Barrett esophagus   . Cancer [facility]) 2002   prostate  . Colon polyp   . Diabetes mellitus without complication ([place])   . Diverticulosis   . Gout   . Heart disease   . Hemangioma    liver  . Hyperlipidemia   . Hypertension   . Myocardial infarct ([place])   . Ocular hypertension   . Peripheral vascular disease ([place])   . Skin cancer   . Sleep apnea   . Vitreoretinal  degeneration     Family History: Family History  Problem Relation Age of Onset  . Stroke Maternal Grandfather     Social History: Social History   Socioeconomic History  . Marital status: Married    Spouse name: Not on file  . Number of children: Not on file  . Years of education: Not on file  . Highest education level: Not on file  Occupational History  . Not on file  Social Needs  . Financial resource strain: Not on file  . Food insecurity:    Worry: Not on file    Inability: Not on file  . Transportation needs:    Medical: Not on file    Non-medical: Not on file  Tobacco Use  . Smoking status: Former Smoker    Years: 20.00    Types: Cigarettes    Last attempt to quit: 12/10/1976    Years since quitting: 41.2  . Smokeless tobacco: Never Used  Substance and  Sexual Activity  . Alcohol use: No  . Drug use: No  . Sexual activity: Not on file  Lifestyle  . Physical activity:    Days per week: Not on file    Minutes per session: Not on file  . Stress: Not on file  Relationships  . Social connections:    Talks on phone: Not on file    Gets together: Not on file    Attends religious service: Not on file    Active member of club or organization: Not on file    Attends meetings of clubs or organizations: Not on file    Relationship status: Not on file  . Intimate partner violence:    Fear of current or ex partner: Not on file    Emotionally abused: Not on file    Physically abused: Not on file    Forced sexual activity: Not on file  Other Topics Concern  . Not on file  Social History Narrative  . Not on file    Vital Signs: Blood pressure 136/64, pulse (!) 52, resp. rate 16, height '5\' 7"'  (1.702 m), weight 197 lb (89.4 kg), SpO2 98 %.  Examination: General Appearance: The patient is well-developed, well-nourished, and in no distress. Skin: Gross inspection of skin unremarkable. Head: normocephalic, no gross deformities. Eyes: no gross deformities  noted. ENT: ears appear grossly normal no exudates. Neck: Supple. No thyromegaly. No LAD. Respiratory: clear at this time. Cardiovascular: Normal S1 and S2 without murmur or rub. Extremities: No cyanosis. pulses are equal. Neurologic: Alert and oriented. No involuntary movements.  LABS: No results found for this or any previous visit (from the past 2160 hour(s)).  Radiology: No results found.  No results found.  No results found.    Assessment and Plan: Patient Active Problem List   Diagnosis Date Noted  . CAD (coronary artery disease) 02/27/2018  . Sacroiliac joint pain 10/08/2017  . Pain in limb 09/24/2017  . Neuropathy 07/24/2017  . NSTEMI (non-ST elevated myocardial infarction) ([place]) 05/06/2017  . PAD (peripheral artery disease) ([place]) 05/01/2017  . History of stroke 01/02/2017  . Barrett's esophagus 10/02/2016  . Carotid stenosis 10/02/2016  . Essential hypertension 10/02/2016  . Hyperlipidemia 10/02/2016  . Diabetes ([place]) 10/02/2016  . 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 Uncontrolled type 2 diabetes mellitus with hyperglycemia, with long-term current use of insulin ([place]) 04/10/2016  . TIA (transient ischemic attack) 10/08/2015    1. COPD   Stable at this time we will continue with present management.  Patient has had no flare-ups and no admissions as already noted above 2. CAD he will continue to follow with Cardiology has been under controlled no recent admissions to the hospital for any chest pain 3. OSA we will continue with sleep apnea therapy with CPAP has been compensated 4. GERD he will continue to follow with his gastroenterologist right now is control rate  General Counseling: I have discussed the findings of the evaluation and examination with Marland Kitchen.  I have also discussed any further diagnostic evaluation thatmay be needed or ordered today. Mike Decker verbalizes understanding of the findings of todays visit. We also reviewed  his medications today and discussed drug interactions and side effects including but not limited excessive drowsiness and altered mental states. We also discussed that there is always a risk not just to him but also people around him. he has been encouraged to call the office with any questions or concerns that should arise related to  todays visit.    Time spent: 55mn  I have personally obtained a history, examined the patient, evaluated laboratory and imaging results, formulated the assessment and plan and placed orders.    SAllyne Gee MD [facility]

## 2018-04-10 DIAGNOSIS — R809 Proteinuria, unspecified: Secondary | ICD-10-CM | POA: Diagnosis not present

## 2018-04-10 DIAGNOSIS — N2581 Secondary hyperparathyroidism of renal origin: Secondary | ICD-10-CM | POA: Diagnosis not present

## 2018-04-10 DIAGNOSIS — I129 Hypertensive chronic kidney disease with stage 1 through stage 4 chronic kidney disease, or unspecified chronic kidney disease: Secondary | ICD-10-CM | POA: Diagnosis not present

## 2018-04-10 DIAGNOSIS — E1122 Type 2 diabetes mellitus with diabetic chronic kidney disease: Secondary | ICD-10-CM | POA: Diagnosis not present

## 2018-04-10 DIAGNOSIS — N183 Chronic kidney disease, stage 3 (moderate): Secondary | ICD-10-CM | POA: Diagnosis not present

## 2018-04-17 DIAGNOSIS — E872 Acidosis: Secondary | ICD-10-CM | POA: Diagnosis not present

## 2018-04-17 DIAGNOSIS — D631 Anemia in chronic kidney disease: Secondary | ICD-10-CM | POA: Diagnosis not present

## 2018-04-17 DIAGNOSIS — I129 Hypertensive chronic kidney disease with stage 1 through stage 4 chronic kidney disease, or unspecified chronic kidney disease: Secondary | ICD-10-CM | POA: Diagnosis not present

## 2018-04-17 DIAGNOSIS — E1122 Type 2 diabetes mellitus with diabetic chronic kidney disease: Secondary | ICD-10-CM | POA: Diagnosis not present

## 2018-04-17 DIAGNOSIS — R809 Proteinuria, unspecified: Secondary | ICD-10-CM | POA: Diagnosis not present

## 2018-04-17 DIAGNOSIS — N184 Chronic kidney disease, stage 4 (severe): Secondary | ICD-10-CM | POA: Diagnosis not present

## 2018-04-24 ENCOUNTER — Other Ambulatory Visit (INDEPENDENT_AMBULATORY_CARE_PROVIDER_SITE_OTHER): Payer: Self-pay | Admitting: Vascular Surgery

## 2018-04-24 DIAGNOSIS — I739 Peripheral vascular disease, unspecified: Secondary | ICD-10-CM

## 2018-04-29 ENCOUNTER — Ambulatory Visit (INDEPENDENT_AMBULATORY_CARE_PROVIDER_SITE_OTHER): Payer: Medicare Other

## 2018-04-29 ENCOUNTER — Encounter (INDEPENDENT_AMBULATORY_CARE_PROVIDER_SITE_OTHER): Payer: Self-pay | Admitting: Vascular Surgery

## 2018-04-29 ENCOUNTER — Ambulatory Visit (INDEPENDENT_AMBULATORY_CARE_PROVIDER_SITE_OTHER): Payer: Medicare Other | Admitting: Vascular Surgery

## 2018-04-29 VITALS — BP 151/66 | HR 60 | Resp 16 | Ht 67.0 in | Wt 199.6 lb

## 2018-04-29 DIAGNOSIS — Z794 Long term (current) use of insulin: Secondary | ICD-10-CM

## 2018-04-29 DIAGNOSIS — I739 Peripheral vascular disease, unspecified: Secondary | ICD-10-CM

## 2018-04-29 DIAGNOSIS — E785 Hyperlipidemia, unspecified: Secondary | ICD-10-CM

## 2018-04-29 DIAGNOSIS — I10 Essential (primary) hypertension: Secondary | ICD-10-CM

## 2018-04-29 DIAGNOSIS — I6523 Occlusion and stenosis of bilateral carotid arteries: Secondary | ICD-10-CM | POA: Diagnosis not present

## 2018-04-29 DIAGNOSIS — E1165 Type 2 diabetes mellitus with hyperglycemia: Secondary | ICD-10-CM | POA: Diagnosis not present

## 2018-04-29 NOTE — Progress Notes (Signed)
MRN : [id number]  Mike Decker is a 77 y.o. (Jul 15, 1941) male who presents with chief complaint of  Chief Complaint  Patient presents with  . Follow-up    23yrabi,carotid  .  History of Present Illness: Patient returns in follow-up of multiple issues.  He is still having a lot of pain in his legs with activity.  Denies any ulceration or infection.  He had treatment for sciatica last year with some improvement in his symptoms, but his legs are now troubling him more and his walking distances have gotten shorter. His ABIs today are 1.04 on the right and 0.97 on the left with good waveforms and digital pressures bilaterally. He denies any focal neurologic symptoms. Specifically, the patient denies amaurosis fugax, speech or swallowing difficulties, or arm or leg weakness or numbness.  His carotid duplex today reveals stable 40 to 59% right ICA stenosis and stable less than 40% left ICA stenosis.  Current Outpatient Medications  Medication Sig Dispense Refill  . acetaminophen (TYLENOL) 500 MG tablet Take 1,000 mg by mouth every 6 (six) hours as needed for mild pain, moderate pain or headache.    . allopurinol (ZYLOPRIM) 100 MG tablet Take 200 mg by mouth 2 (two) times daily.     .Marland KitchenamLODipine (NORVASC) 5 MG tablet TAKE 1 TABLET (5 MG TOTAL) BY MOUTH 2 (TWO) TIMES DAILY.  5  . aspirin (ASPIRIN CHILDRENS) 81 MG chewable tablet Chew 1 tablet (81 mg total) by mouth daily. 120 tablet 0  . budesonide-formoterol (SYMBICORT) 160-4.5 MCG/ACT inhaler Inhale 2 puffs into the lungs 2 (two) times daily.    .Marland KitchenCARAFATE 1 GM/10ML suspension TAKE 2 TEASPOONSFUL (10MLS) BY MOUTH 4 TIMES A DAY BEFORE MEALS AND NIGHTLY FOR 14 DAYS  2  . cetirizine (ZYRTEC) 5 MG tablet Take 5 mg by mouth daily as needed for allergies or rhinitis.     . chlorpheniramine-HYDROcodone (TUSSIONEX) 10-8 MG/5ML SUER TAKE 1/2 TEASPOONFUL BY MOUTH TWICE A DAY  0  . Cholecalciferol (VITAMIN D3) 50000 UNITS CAPS Take 50,000 Units by mouth  every 30 (thirty) days.    . citalopram (CELEXA) 10 MG tablet Take 10 mg by mouth daily.    . clopidogrel (PLAVIX) 75 MG tablet Take 1 tablet (75 mg total) by mouth daily. 30 tablet 0  . colchicine 0.6 MG tablet Take 0.6 mg by mouth 3 (three) times daily.    .Marland Kitchendextromethorphan-guaiFENesin (MUCINEX DM) 30-600 MG 12hr tablet Take 1 tablet by mouth 2 (two) times daily as needed for cough.    . Dimethyl Ether (COMPOUND W COMPLETE) KIT Take 15 mL three times daily before meals for the first 2 days following ablation treatment and then every 4 hours ONLY as needed thereafter.    .Marland KitchenEPINEPHrine (EPI-PEN) 0.3 mg/0.3 mL SOAJ injection Inject into the muscle as needed (anaphylaxis).     . furosemide (LASIX) 20 MG tablet Take 2 tablets (40 mg total) by mouth 2 (two) times daily. 60 tablet 0  . insulin aspart (NOVOLOG FLEXPEN) 100 UNIT/ML FlexPen Inject 14-20 Units into the skin See admin instructions. Inject 14 units every morning with breakfast and follow sliding scale for the rest of meals.    . insulin glargine (LANTUS) 100 UNIT/ML injection Inject 55 Units into the skin at bedtime.     .Marland Kitchenipratropium-albuterol (DUONEB) 0.5-2.5 (3) MG/3ML SOLN Take 3 mLs by nebulization every 4 (four) hours as needed. 360 mL 0  . isosorbide mononitrate (IMDUR) 120 MG 24 hr  tablet Take 1 tablet (120 mg total) by mouth daily. (Patient taking differently: Take 60 mg by mouth daily. ) 30 tablet 0  . lisinopril (PRINIVIL,ZESTRIL) 10 MG tablet Take 10 mg by mouth daily.    . magnesium gluconate (MAGONATE) 500 MG tablet Take 500 mg by mouth daily.    . metoprolol tartrate 75 MG TABS Take 75 mg by mouth 2 (two) times daily. 60 tablet 0  . nitroGLYCERIN (NITROSTAT) 0.4 MG SL tablet Place 0.4 mg under the tongue every 5 (five) minutes x 3 doses as needed for chest pain.     . Omega-3 Fatty Acids (FISH OIL) 1000 MG CAPS Take by mouth daily.    Marland Kitchen oxyCODONE (ROXICODONE) 5 MG/5ML solution Take by mouth.    . oxyCODONE-acetaminophen  (ROXICET) 5-325 MG tablet Take 1 tablet by mouth every 6 (six) hours as needed. 20 tablet 0  . pantoprazole (PROTONIX) 40 MG tablet Take 40 mg by mouth 2 (two) times daily.    . polyethylene glycol powder (GLYCOLAX/MIRALAX) powder Use as directed    . rosuvastatin (CRESTOR) 40 MG tablet Take 40 mg by mouth daily.    . Vitamin D, Ergocalciferol, (DRISDOL) 50000 units CAPS capsule TAKE 1 CAPSULE BY MOUTH ONCE A MONTH  1  . gabapentin (NEURONTIN) 100 MG capsule TAKE 1 CAPSULE THREE TIMES DAILY FOR 1 WEEK, THEN INCREASE TO 2 CAPSULES THREE TIMES DAILY  3   No current facility-administered medications for this visit.     Past Medical History:  Diagnosis Date  . Arthritis   . Atrioventricular canal (AVC)    irregular heart beats  . Barrett esophagus   . Cancer [facility]) 2002   prostate  . Colon polyp   . Diabetes mellitus without complication ([place])   . Diverticulosis   . Gout   . Heart disease   . Hemangioma    liver  . Hyperlipidemia   . Hypertension   . Myocardial infarct ([place])   . Ocular hypertension   . Peripheral vascular disease ([place])   . Skin cancer   . Sleep apnea   . Vitreoretinal degeneration     Past Surgical History:  Procedure Laterality Date  . CATARACT EXTRACTION  2011, 2012  . CORONARY ANGIOPLASTY WITH STENT PLACEMENT  2012  . ESOPHAGOGASTRODUODENOSCOPY (EGD) WITH PROPOFOL N/A 06/01/2016   Procedure: ESOPHAGOGASTRODUODENOSCOPY (EGD) WITH PROPOFOL;  Surgeon: Manya Silvas, MD;  Location[facility] ENDOSCOPY;  Service: Endoscopy;  Laterality: N/A;  . HERNIA REPAIR     x2  . INTRAOCULAR LENS INSERTION    . LEFT HEART CATH AND CORONARY ANGIOGRAPHY N/A 05/09/2017   Procedure: Left Heart Cath and Coronary Angiography;  Surgeon: Yolonda Kida, MD;  Location: [place] CV LAB;  Service: Cardiovascular;  Laterality: N/A;  . NOSE SURGERY     submucous resection  . PROSTATE SURGERY  2002  . ROTATOR CUFF REPAIR Right     Social History  Substance Use Topics  .  Smoking status: Former Smoker    Years: 20.00    Types: Cigarettes    Quit date: 12/10/1976  . Smokeless tobacco: Never Used  . Alcohol use No   NO IVDU         Family History  Problem Relation Age of Onset  . Stroke Maternal Grandfather   No bleeding disorders, clotting disorders, or aneurysms        Allergies  Allergen Reactions  . Peanut-Containing Drug Products Anaphylaxis  . Penicillins Hives and Rash  Has patient had a PCN reaction causing immediate rash, facial/tongue/throat swelling, SOB or lightheadedness with hypotension: Yes Has patient had a PCN reaction causing severe rash involving mucus membranes or skin necrosis: No Has patient had a PCN reaction that required hospitalization No Has patient had a PCN reaction occurring within the last 10 years: No If all of the above answers are "NO", then may proceed with Cephalosporin use.  . Bee Venom Swelling  . Influenza Vaccines Hives  . Inh [Isoniazid] Hives  . Kenalog [Triamcinolone Acetonide] Hives  . Nalfon [Fenoprofen Calcium] Hives     REVIEW OF SYSTEMS(Negative unless checked)  Constitutional: _0 Weight loss_1 Fever_2 Chills Cardiac:_3 Chest pain_4 Chest pressure_5 Palpitations _6 Shortness of breath when laying flat _7 Shortness of breath at rest _8 Shortness of breath with exertion. Vascular: _9 Pain in legs with walking_10 Pain in legsat rest_11 Pain in legs when laying flat _12 Claudication _13 Pain in feet when walking _14 Pain in feet at rest _15 Pain in feet when laying flat _16 History of DVT _17 Phlebitis _18 Swelling in legs _19 Varicose veins _20 Non-healing ulcers Pulmonary: _21 Uses home oxygen _22 Productive cough_23 Hemoptysis _24 Wheeze _25 COPD _26 Asthma Neurologic: _27 Dizziness _28 Blackouts _29 Seizures _30 History of stroke _31 History of TIA_32 Aphasia _33 Temporary blindness_34 Dysphagia _35 Weaknessor numbness in arms _36 Weakness or numbnessin  legs Musculoskeletal: _37 Arthritis _38 Joint swelling _39 Joint pain _40 Low back pain Hematologic:_41 Easy bruising_42 Easy bleeding _43 Hypercoagulable state _44 Anemic _45 Hepatitis Gastrointestinal:_46 Blood in stool_47 Vomiting blood_48 Gastroesophageal reflux/heartburn_49 Difficulty swallowing. Genitourinary: _50 Chronic kidney disease _51 Difficulturination _52 Frequenturination _53 Burning with urination_54 Blood in urine Skin: _55 Rashes _56 Ulcers _57 Wounds Psychological: _58 History of anxiety_59 History of major depression.    Physical Examination  Vitals:   04/29/18 1018  BP: (!) 151/66  Pulse: 60  Resp: 16  Weight: 199 lb 9.6 oz (90.5 kg)  Height: _60  (1.702 m)   Body mass index is 31.26 kg/m. Gen:  WD/WN, NAD Head: [place]/AT, No temporalis wasting. Ear/Nose/Throat: Hearing grossly intact, nares w/o erythema or drainage, trachea midline Eyes: Conjunctiva clear. Sclera non-icteric Neck: Supple.  Soft right carotid bruit  Pulmonary:  Good air movement, equal and clear to auscultation bilaterally.  Cardiac: RRR, No JVD Vascular:  Vessel Right Left  Radial Palpable Palpable                                    Musculoskeletal: M/S 5/5 throughout.  No deformity or atrophy.  Trace lower extremity edema. Neurologic: CN 2-12 intact. Sensation grossly intact in extremities.  Symmetrical.  Speech is fluent. Motor exam as listed above. Psychiatric: Judgment intact, Mood & affect appropriate for pt's clinical situation. Dermatologic: No rashes or ulcers noted.  No cellulitis or open wounds.      CBC Lab Results  Component Value Date   WBC 11.6 (H) 09/22/2017   HGB 11.9 (L) 09/22/2017   HCT 35.9 (L) 09/22/2017   MCV 84.0 09/22/2017   PLT 299 09/22/2017    BMET    Component Value Date/Time   NA 138 09/22/2017 0923   NA 139 11/03/2014 0711   K 4.5 09/22/2017 0923   K 4.6 11/03/2014 0711   CL 105 09/22/2017 0923   CL 104 11/03/2014 0711   CO2 21  (L) 09/22/2017 0923   CO2 28 11/03/2014 0711   GLUCOSE 171 (H) 09/22/2017 0923   GLUCOSE 234 (H) 11/03/2014 0711   BUN 43 (H) 09/22/2017 0923   BUN 43 (H) 11/03/2014 0711   CREATININE 1.96 (H) 09/22/2017 0923   CREATININE 1.68 (H) 11/03/2014 0711   CALCIUM 9.4 09/22/2017 0923   CALCIUM 8.9 11/03/2014 0711   GFRNONAA 31 (L) 09/22/2017 1610  GFRNONAA 43 (L) 11/03/2014 0711   GFRAA 36 (L) 09/22/2017 0923   GFRAA 52 (L) 11/03/2014 0711   CrCl cannot be calculated (Patient's most recent lab result is older than the maximum 21 days allowed.).  COAG Lab Results  Component Value Date   INR 1.10 05/07/2017   INR 0.94 10/08/2015    Radiology No results found.   Assessment/Plan Diabetes (HCC) blood glucose control important in reducing the progression of atherosclerotic disease. Also, involved in wound healing. On appropriate medications.   Essential hypertension blood pressure control important in reducing the progression of atherosclerotic disease. On appropriate oral medications.   Hyperlipidemia lipid control important in reducing the progression of atherosclerotic disease. Continue statin therapy   PAD (peripheral artery disease) (HCC) His ABIs today are 1.04 on the right and 0.97 on the left with good waveforms and digital pressures bilaterally. Lower extremity symptoms are more than likely neuropathic in nature with these findings. Recheck in 1 year  Carotid stenosis His carotid duplex today reveals stable 40 to 59% right ICA stenosis and stable less than 40% left ICA stenosis. No role for intervention at that level.  Continue current medical regimen including aspirin and Plavix.  Recheck in 1 year.    Leotis Pain, MD  04/29/2018 10:55 AM    This note was created with Dragon medical transcription system.  Any errors from dictation are purely unintentional

## 2018-04-29 NOTE — Assessment & Plan Note (Signed)
His ABIs today are 1.04 on the right and 0.97 on the left with good waveforms and digital pressures bilaterally. Lower extremity symptoms are more than likely neuropathic in nature with these findings. Recheck in 1 year

## 2018-04-29 NOTE — Patient Instructions (Signed)

## 2018-04-29 NOTE — Assessment & Plan Note (Signed)
His carotid duplex today reveals stable 40 to 59% right ICA stenosis and stable less than 40% left ICA stenosis. No role for intervention at that level.  Continue current medical regimen including aspirin and Plavix.  Recheck in 1 year.

## 2018-04-30 DIAGNOSIS — G629 Polyneuropathy, unspecified: Secondary | ICD-10-CM | POA: Diagnosis not present

## 2018-04-30 DIAGNOSIS — I6990 Unspecified sequelae of unspecified cerebrovascular disease: Secondary | ICD-10-CM | POA: Diagnosis not present

## 2018-04-30 DIAGNOSIS — I10 Essential (primary) hypertension: Secondary | ICD-10-CM | POA: Diagnosis not present

## 2018-04-30 DIAGNOSIS — I119 Hypertensive heart disease without heart failure: Secondary | ICD-10-CM | POA: Diagnosis not present

## 2018-05-02 ENCOUNTER — Encounter (INDEPENDENT_AMBULATORY_CARE_PROVIDER_SITE_OTHER): Payer: Medicare Other

## 2018-05-02 ENCOUNTER — Ambulatory Visit (INDEPENDENT_AMBULATORY_CARE_PROVIDER_SITE_OTHER): Payer: Medicare Other | Admitting: Vascular Surgery

## 2018-05-02 ENCOUNTER — Encounter (INDEPENDENT_AMBULATORY_CARE_PROVIDER_SITE_OTHER): Payer: No Typology Code available for payment source

## 2018-05-15 DIAGNOSIS — I639 Cerebral infarction, unspecified: Secondary | ICD-10-CM | POA: Diagnosis not present

## 2018-05-15 DIAGNOSIS — J329 Chronic sinusitis, unspecified: Secondary | ICD-10-CM | POA: Diagnosis not present

## 2018-05-15 DIAGNOSIS — E7849 Other hyperlipidemia: Secondary | ICD-10-CM | POA: Diagnosis not present

## 2018-05-15 DIAGNOSIS — I2510 Atherosclerotic heart disease of native coronary artery without angina pectoris: Secondary | ICD-10-CM | POA: Diagnosis not present

## 2018-05-15 DIAGNOSIS — I739 Peripheral vascular disease, unspecified: Secondary | ICD-10-CM | POA: Diagnosis not present

## 2018-05-15 DIAGNOSIS — E669 Obesity, unspecified: Secondary | ICD-10-CM | POA: Diagnosis not present

## 2018-05-15 DIAGNOSIS — I10 Essential (primary) hypertension: Secondary | ICD-10-CM | POA: Diagnosis not present

## 2018-05-15 DIAGNOSIS — M1A00X0 Idiopathic chronic gout, unspecified site, without tophus (tophi): Secondary | ICD-10-CM | POA: Diagnosis not present

## 2018-05-15 DIAGNOSIS — R0602 Shortness of breath: Secondary | ICD-10-CM | POA: Diagnosis not present

## 2018-05-15 DIAGNOSIS — K219 Gastro-esophageal reflux disease without esophagitis: Secondary | ICD-10-CM | POA: Diagnosis not present

## 2018-05-15 DIAGNOSIS — N182 Chronic kidney disease, stage 2 (mild): Secondary | ICD-10-CM | POA: Diagnosis not present

## 2018-05-15 DIAGNOSIS — I208 Other forms of angina pectoris: Secondary | ICD-10-CM | POA: Diagnosis not present

## 2018-05-22 DIAGNOSIS — E1122 Type 2 diabetes mellitus with diabetic chronic kidney disease: Secondary | ICD-10-CM | POA: Diagnosis not present

## 2018-05-22 DIAGNOSIS — E7800 Pure hypercholesterolemia, unspecified: Secondary | ICD-10-CM | POA: Diagnosis not present

## 2018-05-22 DIAGNOSIS — K449 Diaphragmatic hernia without obstruction or gangrene: Secondary | ICD-10-CM | POA: Diagnosis not present

## 2018-05-22 DIAGNOSIS — Z8673 Personal history of transient ischemic attack (TIA), and cerebral infarction without residual deficits: Secondary | ICD-10-CM | POA: Diagnosis not present

## 2018-05-22 DIAGNOSIS — Z8501 Personal history of malignant neoplasm of esophagus: Secondary | ICD-10-CM | POA: Diagnosis not present

## 2018-05-22 DIAGNOSIS — I2510 Atherosclerotic heart disease of native coronary artery without angina pectoris: Secondary | ICD-10-CM | POA: Diagnosis not present

## 2018-05-22 DIAGNOSIS — Z87442 Personal history of urinary calculi: Secondary | ICD-10-CM | POA: Diagnosis not present

## 2018-05-22 DIAGNOSIS — J449 Chronic obstructive pulmonary disease, unspecified: Secondary | ICD-10-CM | POA: Diagnosis not present

## 2018-05-22 DIAGNOSIS — Z87891 Personal history of nicotine dependence: Secondary | ICD-10-CM | POA: Diagnosis not present

## 2018-05-22 DIAGNOSIS — I129 Hypertensive chronic kidney disease with stage 1 through stage 4 chronic kidney disease, or unspecified chronic kidney disease: Secondary | ICD-10-CM | POA: Diagnosis not present

## 2018-05-22 DIAGNOSIS — N400 Enlarged prostate without lower urinary tract symptoms: Secondary | ICD-10-CM | POA: Diagnosis not present

## 2018-05-22 DIAGNOSIS — G4733 Obstructive sleep apnea (adult) (pediatric): Secondary | ICD-10-CM | POA: Diagnosis not present

## 2018-05-22 DIAGNOSIS — I252 Old myocardial infarction: Secondary | ICD-10-CM | POA: Diagnosis not present

## 2018-05-22 DIAGNOSIS — Z7982 Long term (current) use of aspirin: Secondary | ICD-10-CM | POA: Diagnosis not present

## 2018-05-22 DIAGNOSIS — Z09 Encounter for follow-up examination after completed treatment for conditions other than malignant neoplasm: Secondary | ICD-10-CM | POA: Diagnosis not present

## 2018-05-22 DIAGNOSIS — D001 Carcinoma in situ of esophagus: Secondary | ICD-10-CM | POA: Diagnosis not present

## 2018-05-22 DIAGNOSIS — K22711 Barrett's esophagus with high grade dysplasia: Secondary | ICD-10-CM | POA: Diagnosis not present

## 2018-05-22 DIAGNOSIS — Z8546 Personal history of malignant neoplasm of prostate: Secondary | ICD-10-CM | POA: Diagnosis not present

## 2018-05-22 DIAGNOSIS — K2950 Unspecified chronic gastritis without bleeding: Secondary | ICD-10-CM | POA: Diagnosis not present

## 2018-05-22 DIAGNOSIS — Z8719 Personal history of other diseases of the digestive system: Secondary | ICD-10-CM | POA: Diagnosis not present

## 2018-05-22 DIAGNOSIS — E669 Obesity, unspecified: Secondary | ICD-10-CM | POA: Diagnosis not present

## 2018-05-22 DIAGNOSIS — N183 Chronic kidney disease, stage 3 (moderate): Secondary | ICD-10-CM | POA: Diagnosis not present

## 2018-05-22 DIAGNOSIS — Z79899 Other long term (current) drug therapy: Secondary | ICD-10-CM | POA: Diagnosis not present

## 2018-05-22 DIAGNOSIS — Z955 Presence of coronary angioplasty implant and graft: Secondary | ICD-10-CM | POA: Diagnosis not present

## 2018-05-22 DIAGNOSIS — K228 Other specified diseases of esophagus: Secondary | ICD-10-CM | POA: Diagnosis not present

## 2018-05-22 DIAGNOSIS — Q402 Other specified congenital malformations of stomach: Secondary | ICD-10-CM | POA: Diagnosis not present

## 2018-05-22 DIAGNOSIS — Z794 Long term (current) use of insulin: Secondary | ICD-10-CM | POA: Diagnosis not present

## 2018-05-28 DIAGNOSIS — E1159 Type 2 diabetes mellitus with other circulatory complications: Secondary | ICD-10-CM | POA: Diagnosis not present

## 2018-06-03 DIAGNOSIS — E1142 Type 2 diabetes mellitus with diabetic polyneuropathy: Secondary | ICD-10-CM | POA: Diagnosis not present

## 2018-06-03 DIAGNOSIS — E1129 Type 2 diabetes mellitus with other diabetic kidney complication: Secondary | ICD-10-CM | POA: Diagnosis not present

## 2018-06-03 DIAGNOSIS — Z794 Long term (current) use of insulin: Secondary | ICD-10-CM | POA: Diagnosis not present

## 2018-06-03 DIAGNOSIS — N184 Chronic kidney disease, stage 4 (severe): Secondary | ICD-10-CM | POA: Diagnosis not present

## 2018-06-03 DIAGNOSIS — E1159 Type 2 diabetes mellitus with other circulatory complications: Secondary | ICD-10-CM | POA: Diagnosis not present

## 2018-06-03 DIAGNOSIS — E1122 Type 2 diabetes mellitus with diabetic chronic kidney disease: Secondary | ICD-10-CM | POA: Diagnosis not present

## 2018-06-03 DIAGNOSIS — E875 Hyperkalemia: Secondary | ICD-10-CM | POA: Diagnosis not present

## 2018-06-03 DIAGNOSIS — R809 Proteinuria, unspecified: Secondary | ICD-10-CM | POA: Diagnosis not present

## 2018-07-02 DIAGNOSIS — L570 Actinic keratosis: Secondary | ICD-10-CM | POA: Diagnosis not present

## 2018-07-02 DIAGNOSIS — Z85828 Personal history of other malignant neoplasm of skin: Secondary | ICD-10-CM | POA: Diagnosis not present

## 2018-07-02 DIAGNOSIS — D2272 Melanocytic nevi of left lower limb, including hip: Secondary | ICD-10-CM | POA: Diagnosis not present

## 2018-07-02 DIAGNOSIS — L821 Other seborrheic keratosis: Secondary | ICD-10-CM | POA: Diagnosis not present

## 2018-07-02 DIAGNOSIS — Z08 Encounter for follow-up examination after completed treatment for malignant neoplasm: Secondary | ICD-10-CM | POA: Diagnosis not present

## 2018-07-02 DIAGNOSIS — X32XXXA Exposure to sunlight, initial encounter: Secondary | ICD-10-CM | POA: Diagnosis not present

## 2018-07-02 DIAGNOSIS — D2261 Melanocytic nevi of right upper limb, including shoulder: Secondary | ICD-10-CM | POA: Diagnosis not present

## 2018-07-02 DIAGNOSIS — D2262 Melanocytic nevi of left upper limb, including shoulder: Secondary | ICD-10-CM | POA: Diagnosis not present

## 2018-07-02 DIAGNOSIS — D485 Neoplasm of uncertain behavior of skin: Secondary | ICD-10-CM | POA: Diagnosis not present

## 2018-07-02 DIAGNOSIS — D225 Melanocytic nevi of trunk: Secondary | ICD-10-CM | POA: Diagnosis not present

## 2018-08-28 DIAGNOSIS — I252 Old myocardial infarction: Secondary | ICD-10-CM | POA: Diagnosis not present

## 2018-08-28 DIAGNOSIS — I10 Essential (primary) hypertension: Secondary | ICD-10-CM | POA: Diagnosis not present

## 2018-08-28 DIAGNOSIS — K5732 Diverticulitis of large intestine without perforation or abscess without bleeding: Secondary | ICD-10-CM | POA: Diagnosis not present

## 2018-08-28 DIAGNOSIS — I119 Hypertensive heart disease without heart failure: Secondary | ICD-10-CM | POA: Diagnosis not present

## 2018-09-04 ENCOUNTER — Encounter: Payer: Self-pay | Admitting: Internal Medicine

## 2018-09-04 ENCOUNTER — Ambulatory Visit
Admission: RE | Admit: 2018-09-04 | Discharge: 2018-09-04 | Disposition: A | Discharge status: Home / Self Care | Payer: Medicare Other | Source: Ambulatory Visit | Attending: Internal Medicine | Admitting: Internal Medicine

## 2018-09-04 ENCOUNTER — Ambulatory Visit (INDEPENDENT_AMBULATORY_CARE_PROVIDER_SITE_OTHER): Payer: Medicare Other | Admitting: Internal Medicine

## 2018-09-04 VITALS — BP 140/70 | HR 78 | Resp 16 | Ht 67.0 in | Wt 197.0 lb

## 2018-09-04 DIAGNOSIS — J9811 Atelectasis: Secondary | ICD-10-CM | POA: Insufficient documentation

## 2018-09-04 DIAGNOSIS — R059 Cough, unspecified: Secondary | ICD-10-CM

## 2018-09-04 DIAGNOSIS — I214 Non-ST elevation (NSTEMI) myocardial infarction: Secondary | ICD-10-CM

## 2018-09-04 DIAGNOSIS — J329 Chronic sinusitis, unspecified: Secondary | ICD-10-CM

## 2018-09-04 DIAGNOSIS — Z9989 Dependence on other enabling machines and devices: Secondary | ICD-10-CM

## 2018-09-04 DIAGNOSIS — R0602 Shortness of breath: Secondary | ICD-10-CM | POA: Insufficient documentation

## 2018-09-04 DIAGNOSIS — R05 Cough: Secondary | ICD-10-CM | POA: Diagnosis not present

## 2018-09-04 DIAGNOSIS — G4733 Obstructive sleep apnea (adult) (pediatric): Secondary | ICD-10-CM

## 2018-09-04 DIAGNOSIS — I6523 Occlusion and stenosis of bilateral carotid arteries: Secondary | ICD-10-CM

## 2018-09-04 MED ORDER — LEVOFLOXACIN 500 MG PO TABS: 500.0000 mg | ORAL_TABLET | Freq: Every day | ORAL | 0 refills | Status: DC

## 2018-09-04 NOTE — Progress Notes (Signed)
[employer] [place], [place] [address]  Pulmonary Sleep Medicine   Office Visit Note  Patient Name: Mike Decker DOB: [birth year] MRN [id number]  Date of Service: 09/04/2018  Complaints/HPI: He has been having cough and some congestion. Patient has been on zpk twice and also has been on prednisone. Patient feels a little better but cough is not gone entirely. He has been using his CPAP device as prescribed. Patient washes with baby shampoo. He has ordered the CPAP cleaner device and is waiting on it. No recent CXR noted on file.  The last chest x-ray that he had shown some interstitial disease that was last year.  He also has not had a up-to-date pulmonary function test.  Patient states he does not have any hemoptysis.  He is not having any chest pain.  He does have a history of coronary disease in the past as previously noted.  Patient has not had any weight loss.  Does occasionally have wheezing but is not pronounced.  As far as his CPAP is concerned he probably needs to use a different fit for cleaning.  ROS  General: (-) fever, (-) chills, (-) night sweats, (-) weakness Skin: (-) rashes, (-) itching,. Eyes: (-) visual changes, (-) redness, (-) itching. Nose and Sinuses: (-) nasal stuffiness or itchiness, (-) postnasal drip, (-) nosebleeds, (-) sinus trouble. Mouth and Throat: (-) sore throat, (-) hoarseness. Neck: (-) swollen glands, (-) enlarged thyroid, (-) neck pain. Respiratory: + cough, (-) bloody sputum, - shortness of breath, - wheezing. Cardiovascular: - ankle swelling, (-) chest pain. Lymphatic: (-) lymph node enlargement. Neurologic: (-) numbness, (-) tingling. Psychiatric: (-) anxiety, (-) depression   Current Medication: Outpatient Encounter Medications as of 09/04/2018  Medication Sig  . acetaminophen (TYLENOL) 500 MG tablet Take 1,000 mg by mouth every 6 (six) hours as needed for mild pain, moderate pain or headache.  . allopurinol  (ZYLOPRIM) 100 MG tablet Take 200 mg by mouth 2 (two) times daily.   Marland Kitchen amLODipine (NORVASC) 5 MG tablet TAKE 1 TABLET (5 MG TOTAL) BY MOUTH 2 (TWO) TIMES DAILY.  Marland Kitchen aspirin (ASPIRIN CHILDRENS) 81 MG chewable tablet Chew 1 tablet (81 mg total) by mouth daily.  . budesonide-formoterol (SYMBICORT) 160-4.5 MCG/ACT inhaler Inhale 2 puffs into the lungs 2 (two) times daily.  Marland Kitchen CARAFATE 1 GM/10ML suspension TAKE 2 TEASPOONSFUL (10MLS) BY MOUTH 4 TIMES A DAY BEFORE MEALS AND NIGHTLY FOR 14 DAYS  . cetirizine (ZYRTEC) 5 MG tablet Take 5 mg by mouth daily as needed for allergies or rhinitis.   . chlorpheniramine-HYDROcodone (TUSSIONEX) 10-8 MG/5ML SUER TAKE 1/2 TEASPOONFUL BY MOUTH TWICE A DAY  . Cholecalciferol (VITAMIN D3) 50000 UNITS CAPS Take 50,000 Units by mouth every 30 (thirty) days.  . citalopram (CELEXA) 10 MG tablet Take 10 mg by mouth daily.  . clopidogrel (PLAVIX) 75 MG tablet Take 1 tablet (75 mg total) by mouth daily.  . colchicine 0.6 MG tablet Take 0.6 mg by mouth 3 (three) times daily.  Marland Kitchen dextromethorphan-guaiFENesin (MUCINEX DM) 30-600 MG 12hr tablet Take 1 tablet by mouth 2 (two) times daily as needed for cough.  . Dimethyl Ether (COMPOUND W COMPLETE) KIT Take 15 mL three times daily before meals for the first 2 days following ablation treatment and then every 4 hours ONLY as needed thereafter.  Marland Kitchen EPINEPHrine (EPI-PEN) 0.3 mg/0.3 mL SOAJ injection Inject into the muscle as needed (anaphylaxis).   . furosemide (LASIX) 20 MG tablet Take 2 tablets (40 mg  total) by mouth 2 (two) times daily.  Marland Kitchen gabapentin (NEURONTIN) 100 MG capsule TAKE 1 CAPSULE THREE TIMES DAILY FOR 1 WEEK, THEN INCREASE TO 2 CAPSULES THREE TIMES DAILY  . insulin aspart (NOVOLOG FLEXPEN) 100 UNIT/ML FlexPen Inject 14-20 Units into the skin See admin instructions. Inject 14 units every morning with breakfast and follow sliding scale for the rest of meals.  . insulin glargine (LANTUS) 100 UNIT/ML injection Inject 55 Units into  the skin at bedtime.   Marland Kitchen ipratropium-albuterol (DUONEB) 0.5-2.5 (3) MG/3ML SOLN Take 3 mLs by nebulization every 4 (four) hours as needed.  . isosorbide mononitrate (IMDUR) 120 MG 24 hr tablet Take 1 tablet (120 mg total) by mouth daily. (Patient taking differently: Take 60 mg by mouth daily. )  . lisinopril (PRINIVIL,ZESTRIL) 10 MG tablet Take 10 mg by mouth daily.  . magnesium gluconate (MAGONATE) 500 MG tablet Take 500 mg by mouth daily.  . metoprolol tartrate 75 MG TABS Take 75 mg by mouth 2 (two) times daily. (Patient taking differently: Take 50 mg by mouth 2 (two) times daily. )  . nitroGLYCERIN (NITROSTAT) 0.4 MG SL tablet Place 0.4 mg under the tongue every 5 (five) minutes x 3 doses as needed for chest pain.   . Omega-3 Fatty Acids (FISH OIL) 1000 MG CAPS Take by mouth daily.  Marland Kitchen oxyCODONE (ROXICODONE) 5 MG/5ML solution Take by mouth.  . oxyCODONE-acetaminophen (ROXICET) 5-325 MG tablet Take 1 tablet by mouth every 6 (six) hours as needed.  . pantoprazole (PROTONIX) 40 MG tablet Take 40 mg by mouth 2 (two) times daily.  . polyethylene glycol powder (GLYCOLAX/MIRALAX) powder Use as directed  . rosuvastatin (CRESTOR) 40 MG tablet Take 40 mg by mouth daily.  . Vitamin D, Ergocalciferol, (DRISDOL) 50000 units CAPS capsule TAKE 1 CAPSULE BY MOUTH ONCE A MONTH   No facility-administered encounter medications on file as of 09/04/2018.     Surgical History: Past Surgical History:  Procedure Laterality Date  . CATARACT EXTRACTION  2011, 2012  . CORONARY ANGIOPLASTY WITH STENT PLACEMENT  2012  . ESOPHAGOGASTRODUODENOSCOPY (EGD) WITH PROPOFOL N/A 06/01/2016   Procedure: ESOPHAGOGASTRODUODENOSCOPY (EGD) WITH PROPOFOL;  Surgeon: Manya Silvas, MD;  Location: [facility] ENDOSCOPY;  Service: Endoscopy;  Laterality: N/A;  . HERNIA REPAIR     x2  . INTRAOCULAR LENS INSERTION    . LEFT HEART CATH AND CORONARY ANGIOGRAPHY N/A 05/09/2017   Procedure: Left Heart Cath and Coronary Angiography;  Surgeon:  Yolonda Kida, MD;  Location: [facility];  Service: Cardiovascular;  Laterality: N/A;  . NOSE SURGERY     submucous resection  . PROSTATE SURGERY  2002  . ROTATOR CUFF REPAIR Right     Medical History: Past Medical History:  Diagnosis Date  . Arthritis   . Atrioventricular canal (AVC)    irregular heart beats  . Barrett esophagus   . Cancer [facility]) 2002   prostate  . Colon polyp   . Diabetes mellitus without complication ([place])   . Diverticulosis   . Gout   . Heart disease   . Hemangioma    liver  . Hyperlipidemia   . Hypertension   . Myocardial infarct ([place])   . Ocular hypertension   . Peripheral vascular disease ([place])   . Skin cancer   . Sleep apnea   . Vitreoretinal degeneration     Family History: Family History  Problem Relation Age of Onset  . Stroke Maternal Grandfather     Social History: Social History  Socioeconomic History  . Marital status: Married    Spouse name: Not on file  . Number of children: Not on file  . Years of education: Not on file  . Highest education level: Not on file  Occupational History  . Not on file  Social Needs  . Financial resource strain: Not on file  . Food insecurity:    Worry: Not on file    Inability: Not on file  . Transportation needs:    Medical: Not on file    Non-medical: Not on file  Tobacco Use  . Smoking status: Former Smoker    Years: 20.00    Types: Cigarettes    Last attempt to quit: 12/10/1976    Years since quitting: 41.7  . Smokeless tobacco: Never Used  Substance and Sexual Activity  . Alcohol use: No  . Drug use: No  . Sexual activity: Not on file  Lifestyle  . Physical activity:    Days per week: Not on file    Minutes per session: Not on file  . Stress: Not on file  Relationships  . Social connections:    Talks on phone: Not on file    Gets together: Not on file    Attends religious service: Not on file    Active member of club or organization: Not on file    Attends  meetings of clubs or organizations: Not on file    Relationship status: Not on file  . Intimate partner violence:    Fear of current or ex partner: Not on file    Emotionally abused: Not on file    Physically abused: Not on file    Forced sexual activity: Not on file  Other Topics Concern  . Not on file  Social History Narrative  . Not on file    Vital Signs: Blood pressure 140/70, pulse 78, resp. rate 16, height '5\' 7"'  (1.702 m), weight 197 lb (89.4 kg), SpO2 96 %.  Examination: General Appearance: The patient is well-developed, well-nourished, and in no distress. Skin: Gross inspection of skin unremarkable. Head: normocephalic, no gross deformities. Eyes: no gross deformities noted. ENT: ears appear grossly normal no exudates. Neck: Supple. No thyromegaly. No LAD. Respiratory: few rhonchi noted. Basilar crackles present Cardiovascular: Normal S1 and S2 without murmur or rub. Extremities: No cyanosis. pulses are equal. Neurologic: Alert and oriented. No involuntary movements.  LABS: No results found for this or any previous visit (from the past 2160 hour(s)).  Radiology: No results found.  No results found.  No results found.    Assessment and Plan: Patient Active Problem List   Diagnosis Date Noted  . CAD (coronary artery disease) 02/27/2018  . Sacroiliac joint pain 10/08/2017  . Pain in limb 09/24/2017  . Neuropathy 07/24/2017  . NSTEMI (non-ST elevated myocardial infarction) ([place]) 05/06/2017  . PAD (peripheral artery disease) ([place]) 05/01/2017  . History of stroke 01/02/2017  . Barrett's esophagus 10/02/2016  . Carotid stenosis 10/02/2016  . Essential hypertension 10/02/2016  . Hyperlipidemia 10/02/2016  . Diabetes ([place]) 10/02/2016  . 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 Uncontrolled type 2 diabetes mellitus with hyperglycemia, with long-term current use of insulin ([place]) 04/10/2016  . TIA (transient ischemic attack) 10/08/2015     1. Cough not improving patient has been on Zithromax x2 I would recommend trying Levaquin in place of the Zithromax.  I do not think he needs another round of steroids however.  If his cough does not improve  will investigate further.  I have gone ahead and ordered a chest x-ray to be done based on the previous on having been abnormal. 2. OSA on CPAP therapy patient will be continued on current pressures.  We will continue with supportive care also instructed on how to properly clean the CPAP until he gets his cleaning machine. 3. NSTEMI stable at this time we will continue with supportive care no active chest pain to suggest coronary disease 4. Sinusitis prescription for Levaquin was given 5. SOB spirometry done in the office complete pulmonary function ordered  General Counseling: I have discussed the findings of the evaluation and examination with Marland Kitchen.  I have also discussed any further diagnostic evaluation thatmay be needed or ordered today. Cameron verbalizes understanding of the findings of todays visit. We also reviewed his medications today and discussed drug interactions and side effects including but not limited excessive drowsiness and altered mental states. We also discussed that there is always a risk not just to him but also people around him. he has been encouraged to call the office with any questions or concerns that should arise related to todays visit.    Time spent: 47mn  I have personally obtained a history, examined the patient, evaluated laboratory and imaging results, formulated the assessment and plan and placed orders.    SAllyne Gee MD FMemorialcare Saddleback Medical CenterPulmonary and Critical Care Sleep medicine

## 2018-09-04 NOTE — Patient Instructions (Signed)

## 2018-09-10 DIAGNOSIS — E1159 Type 2 diabetes mellitus with other circulatory complications: Secondary | ICD-10-CM | POA: Diagnosis not present

## 2018-09-11 DIAGNOSIS — E875 Hyperkalemia: Secondary | ICD-10-CM | POA: Diagnosis not present

## 2018-09-16 DIAGNOSIS — E1142 Type 2 diabetes mellitus with diabetic polyneuropathy: Secondary | ICD-10-CM | POA: Diagnosis not present

## 2018-09-16 DIAGNOSIS — E1159 Type 2 diabetes mellitus with other circulatory complications: Secondary | ICD-10-CM | POA: Diagnosis not present

## 2018-09-16 DIAGNOSIS — E1122 Type 2 diabetes mellitus with diabetic chronic kidney disease: Secondary | ICD-10-CM | POA: Diagnosis not present

## 2018-09-16 DIAGNOSIS — N184 Chronic kidney disease, stage 4 (severe): Secondary | ICD-10-CM | POA: Diagnosis not present

## 2018-09-16 DIAGNOSIS — Z794 Long term (current) use of insulin: Secondary | ICD-10-CM | POA: Diagnosis not present

## 2018-09-16 DIAGNOSIS — E875 Hyperkalemia: Secondary | ICD-10-CM | POA: Diagnosis not present

## 2018-09-24 ENCOUNTER — Ambulatory Visit: Payer: Self-pay | Admitting: Internal Medicine

## 2018-09-25 DIAGNOSIS — J339 Nasal polyp, unspecified: Secondary | ICD-10-CM | POA: Diagnosis not present

## 2018-09-25 DIAGNOSIS — R05 Cough: Secondary | ICD-10-CM | POA: Diagnosis not present

## 2018-09-25 DIAGNOSIS — J301 Allergic rhinitis due to pollen: Secondary | ICD-10-CM | POA: Diagnosis not present

## 2018-10-07 ENCOUNTER — Ambulatory Visit: Payer: Self-pay | Admitting: Internal Medicine

## 2018-10-08 ENCOUNTER — Ambulatory Visit (INDEPENDENT_AMBULATORY_CARE_PROVIDER_SITE_OTHER): Payer: Medicare Other | Admitting: Internal Medicine

## 2018-10-08 DIAGNOSIS — R0602 Shortness of breath: Secondary | ICD-10-CM | POA: Diagnosis not present

## 2018-10-08 LAB — PULMONARY FUNCTION TEST

## 2018-10-09 ENCOUNTER — Ambulatory Visit: Payer: Self-pay | Admitting: Internal Medicine

## 2018-10-09 DIAGNOSIS — N183 Chronic kidney disease, stage 3 (moderate): Secondary | ICD-10-CM | POA: Diagnosis not present

## 2018-10-09 DIAGNOSIS — R809 Proteinuria, unspecified: Secondary | ICD-10-CM | POA: Diagnosis not present

## 2018-10-09 DIAGNOSIS — E119 Type 2 diabetes mellitus without complications: Secondary | ICD-10-CM | POA: Diagnosis not present

## 2018-10-09 DIAGNOSIS — N2581 Secondary hyperparathyroidism of renal origin: Secondary | ICD-10-CM | POA: Diagnosis not present

## 2018-10-09 DIAGNOSIS — E1122 Type 2 diabetes mellitus with diabetic chronic kidney disease: Secondary | ICD-10-CM | POA: Diagnosis not present

## 2018-10-09 DIAGNOSIS — I129 Hypertensive chronic kidney disease with stage 1 through stage 4 chronic kidney disease, or unspecified chronic kidney disease: Secondary | ICD-10-CM | POA: Diagnosis not present

## 2018-10-09 NOTE — Procedures (Signed)
[place], [id number]  DATE OF SERVICE: October 08, 2018  Complete Pulmonary Function Testing Interpretation:  FINDINGS:  Forced vital capacity is normal.  The FEV1 is normal.  Postbronchodilator there is no significant change in the FEV1 however clinical improvement may occur in the absence of spirometric improvement does not preclude the use of bronchodilators.  Total lung capacity is within normal range.  Residual volume is decreased residual volume total lung capacity ratio is decreased FRC is decreased.  DLCO is moderately decreased.  IMPRESSION:  Spirometry and lung volumes are basically within normal limits.  DLCO is moderately decreased and may be reduced and ongoing tobacco use interstitial lung disease or emphysema.  Clinical correlation is recommended  Allyne Gee, MD [facility] Pulmonary Critical Care Medicine Sleep Medicine

## 2018-10-20 ENCOUNTER — Encounter: Payer: Self-pay | Admitting: Internal Medicine

## 2018-10-20 ENCOUNTER — Ambulatory Visit (INDEPENDENT_AMBULATORY_CARE_PROVIDER_SITE_OTHER): Payer: Medicare Other | Admitting: Internal Medicine

## 2018-10-20 VITALS — BP 138/70 | HR 67 | Resp 16 | Ht 66.0 in | Wt 203.4 lb

## 2018-10-20 DIAGNOSIS — K2100 Gastro-esophageal reflux disease with esophagitis, without bleeding: Secondary | ICD-10-CM

## 2018-10-20 DIAGNOSIS — I10 Essential (primary) hypertension: Secondary | ICD-10-CM | POA: Diagnosis not present

## 2018-10-20 DIAGNOSIS — J449 Chronic obstructive pulmonary disease, unspecified: Secondary | ICD-10-CM

## 2018-10-20 DIAGNOSIS — Z9989 Dependence on other enabling machines and devices: Secondary | ICD-10-CM

## 2018-10-20 DIAGNOSIS — R0602 Shortness of breath: Secondary | ICD-10-CM

## 2018-10-20 DIAGNOSIS — G4733 Obstructive sleep apnea (adult) (pediatric): Secondary | ICD-10-CM | POA: Diagnosis not present

## 2018-10-20 DIAGNOSIS — I6523 Occlusion and stenosis of bilateral carotid arteries: Secondary | ICD-10-CM

## 2018-10-20 NOTE — Progress Notes (Signed)
[facility] [place], [place] [address]  Pulmonary Sleep Medicine   Office Visit Note  Patient Name: Mike Decker DOB: 1941/06/19 MRN [id number]  Date of Service: 10/20/2018  Complaints/HPI: Pt is here for follow up on PFT's.  He has a history of OSA using CPAP, COPD, GERD, and hypertension.  Patient's PFTs appear well and we discussed them at this visit.  He continues to use his CPAP without problems.  He uses his inhalers for COPD with good results.  He denies any overwhelming symptoms at this time.  His GERD and hypertension are well controlled using medications.        ROS  General: (-) fever, (-) chills, (-) night sweats, (-) weakness Skin: (-) rashes, (-) itching,. Eyes: (-) visual changes, (-) redness, (-) itching. Nose and Sinuses: (-) nasal stuffiness or itchiness, (-) postnasal drip, (-) nosebleeds, (-) sinus trouble. Mouth and Throat: (-) sore throat, (-) hoarseness. Neck: (-) swollen glands, (-) enlarged thyroid, (-) neck pain. Respiratory: - cough, (-) bloody sputum, - shortness of breath, - wheezing. Cardiovascular: - ankle swelling, (-) chest pain. Lymphatic: (-) lymph node enlargement. Neurologic: (-) numbness, (-) tingling. Psychiatric: (-) anxiety, (-) depression   Current Medication: Outpatient Encounter Medications as of 10/20/2018  Medication Sig  . acetaminophen (TYLENOL) 500 MG tablet Take 1,000 mg by mouth every 6 (six) hours as needed for mild pain, moderate pain or headache.  . allopurinol (ZYLOPRIM) 100 MG tablet Take 200 mg by mouth 2 (two) times daily.   Marland Kitchen amLODipine (NORVASC) 5 MG tablet TAKE 1 TABLET (5 MG TOTAL) BY MOUTH 2 (TWO) TIMES DAILY.  Marland Kitchen aspirin (ASPIRIN CHILDRENS) 81 MG chewable tablet Chew 1 tablet (81 mg total) by mouth daily.  . budesonide-formoterol (SYMBICORT) 160-4.5 MCG/ACT inhaler Inhale 2 puffs into the lungs 2 (two) times daily.  Marland Kitchen CARAFATE 1 GM/10ML suspension TAKE 2 TEASPOONSFUL (10MLS) BY MOUTH 4  TIMES A DAY BEFORE MEALS AND NIGHTLY FOR 14 DAYS  . cetirizine (ZYRTEC) 5 MG tablet Take 5 mg by mouth daily as needed for allergies or rhinitis.   . chlorpheniramine-HYDROcodone (TUSSIONEX) 10-8 MG/5ML SUER TAKE 1/2 TEASPOONFUL BY MOUTH TWICE A DAY  . Cholecalciferol (VITAMIN D3) 50000 UNITS CAPS Take 50,000 Units by mouth every 30 (thirty) days.  . citalopram (CELEXA) 10 MG tablet Take 10 mg by mouth daily.  . clopidogrel (PLAVIX) 75 MG tablet Take 1 tablet (75 mg total) by mouth daily.  . colchicine 0.6 MG tablet Take 0.6 mg by mouth 3 (three) times daily.  Marland Kitchen dextromethorphan-guaiFENesin (MUCINEX DM) 30-600 MG 12hr tablet Take 1 tablet by mouth 2 (two) times daily as needed for cough.  . Dimethyl Ether (COMPOUND W COMPLETE) KIT Take 15 mL three times daily before meals for the first 2 days following ablation treatment and then every 4 hours ONLY as needed thereafter.  Marland Kitchen EPINEPHrine (EPI-PEN) 0.3 mg/0.3 mL SOAJ injection Inject into the muscle as needed (anaphylaxis).   . furosemide (LASIX) 20 MG tablet Take 2 tablets (40 mg total) by mouth 2 (two) times daily.  Marland Kitchen gabapentin (NEURONTIN) 100 MG capsule TAKE 1 CAPSULE THREE TIMES DAILY FOR 1 WEEK, THEN INCREASE TO 2 CAPSULES THREE TIMES DAILY  . insulin aspart (NOVOLOG FLEXPEN) 100 UNIT/ML FlexPen Inject 14-20 Units into the skin See admin instructions. Inject 14 units every morning with breakfast and follow sliding scale for the rest of meals.  . insulin glargine (LANTUS) 100 UNIT/ML injection Inject 62 Units into the skin at  bedtime.   Marland Kitchen ipratropium-albuterol (DUONEB) 0.5-2.5 (3) MG/3ML SOLN Take 3 mLs by nebulization every 4 (four) hours as needed.  . isosorbide mononitrate (IMDUR) 120 MG 24 hr tablet Take 1 tablet (120 mg total) by mouth daily. (Patient taking differently: Take 60 mg by mouth daily. )  . levofloxacin (LEVAQUIN) 500 MG tablet Take 1 tablet (500 mg total) by mouth daily.  . magnesium gluconate (MAGONATE) 500 MG tablet Take 500 mg  by mouth daily.  . metoprolol tartrate 75 MG TABS Take 75 mg by mouth 2 (two) times daily. (Patient taking differently: Take 50 mg by mouth 2 (two) times daily. )  . nitroGLYCERIN (NITROSTAT) 0.4 MG SL tablet Place 0.4 mg under the tongue every 5 (five) minutes x 3 doses as needed for chest pain.   . Omega-3 Fatty Acids (FISH OIL) 1000 MG CAPS Take by mouth daily.  Marland Kitchen oxyCODONE (ROXICODONE) 5 MG/5ML solution Take by mouth.  . oxyCODONE-acetaminophen (ROXICET) 5-325 MG tablet Take 1 tablet by mouth every 6 (six) hours as needed.  . pantoprazole (PROTONIX) 40 MG tablet Take 40 mg by mouth 2 (two) times daily.  . polyethylene glycol powder (GLYCOLAX/MIRALAX) powder Use as directed  . rosuvastatin (CRESTOR) 40 MG tablet Take 40 mg by mouth daily.  . Vitamin D, Ergocalciferol, (DRISDOL) 50000 units CAPS capsule TAKE 1 CAPSULE BY MOUTH ONCE A MONTH  . [DISCONTINUED] lisinopril (PRINIVIL,ZESTRIL) 10 MG tablet Take 10 mg by mouth daily.   No facility-administered encounter medications on file as of 10/20/2018.     Surgical History: Past Surgical History:  Procedure Laterality Date  . CATARACT EXTRACTION  2011, 2012  . CORONARY ANGIOPLASTY WITH STENT PLACEMENT  2012  . ESOPHAGOGASTRODUODENOSCOPY (EGD) WITH PROPOFOL N/A 06/01/2016   Procedure: ESOPHAGOGASTRODUODENOSCOPY (EGD) WITH PROPOFOL;  Surgeon: Manya Silvas, MD;  Location: [facility] ENDOSCOPY;  Service: Endoscopy;  Laterality: N/A;  . HERNIA REPAIR     x2  . INTRAOCULAR LENS INSERTION    . LEFT HEART CATH AND CORONARY ANGIOGRAPHY N/A 05/09/2017   Procedure: Left Heart Cath and Coronary Angiography;  Surgeon: Yolonda Kida, MD;  Location: [facility] CV LAB;  Service: Cardiovascular;  Laterality: N/A;  . NOSE SURGERY     submucous resection  . PROSTATE SURGERY  2002  . ROTATOR CUFF REPAIR Right     Medical History: Past Medical History:  Diagnosis Date  . Arthritis   . Atrioventricular canal (AVC)    irregular heart beats  .  Barrett esophagus   . Cancer [facility]) 2002   prostate  . Colon polyp   . Diabetes mellitus without complication ([place])   . Diverticulosis   . Gout   . Heart disease   . Hemangioma    liver  . Hyperlipidemia   . Hypertension   . Myocardial infarct ([place])   . Ocular hypertension   . Peripheral vascular disease ([place])   . Skin cancer   . Sleep apnea   . Vitreoretinal degeneration     Family History: Family History  Problem Relation Age of Onset  . Stroke Maternal Grandfather     Social History: Social History   Socioeconomic History  . Marital status: Married    Spouse name: Not on file  . Number of children: Not on file  . Years of education: Not on file  . Highest education level: Not on file  Occupational History  . Not on file  Social Needs  . Financial resource strain: Not on file  . Food  insecurity:    Worry: Not on file    Inability: Not on file  . Transportation needs:    Medical: Not on file    Non-medical: Not on file  Tobacco Use  . Smoking status: Former Smoker    Years: 20.00    Types: Cigarettes    Last attempt to quit: 12/10/1976    Years since quitting: 41.8  . Smokeless tobacco: Never Used  Substance and Sexual Activity  . Alcohol use: No  . Drug use: No  . Sexual activity: Not on file  Lifestyle  . Physical activity:    Days per week: Not on file    Minutes per session: Not on file  . Stress: Not on file  Relationships  . Social connections:    Talks on phone: Not on file    Gets together: Not on file    Attends religious service: Not on file    Active member of club or organization: Not on file    Attends meetings of clubs or organizations: Not on file    Relationship status: Not on file  . Intimate partner violence:    Fear of current or ex partner: Not on file    Emotionally abused: Not on file    Physically abused: Not on file    Forced sexual activity: Not on file  Other Topics Concern  . Not on file  Social History Narrative  .  Not on file    Vital Signs: Blood pressure 138/70, pulse 67, resp. rate 16, height _0  (1.676 m), weight 203 lb 6.4 oz (92.3 kg), SpO2 95 %.  Examination: General Appearance: The patient is well-developed, well-nourished, and in no distress. Skin: Gross inspection of skin unremarkable. Head: normocephalic, no gross deformities. Eyes: no gross deformities noted. ENT: ears appear grossly normal no exudates. Neck: Supple. No thyromegaly. No LAD. Respiratory: clear to auscultation. Cardiovascular: Normal S1 and S2 without murmur or rub. Extremities: No cyanosis. pulses are equal. Neurologic: Alert and oriented. No involuntary movements.  LABS: No results found for this or any previous visit (from the past 2160 hour(s)).  Radiology: Dg Chest 2 View  Result Date: 09/04/2018 CLINICAL DATA:  Shortness of breath. EXAM: CHEST - 2 VIEW COMPARISON:  05/13/2017. FINDINGS: Mediastinum and hilar structures normal. Heart size stable. Low lung volumes with mild bibasilar atelectasis. No focal infiltrate. No pleural effusion or pneumothorax. Diffuse thoracic cage osteopenia. Degenerative change thoracic spine. IMPRESSION: Low lung volumes with mild bibasilar atelectasis. No focal infiltrate Electronically Signed   By: Marcello Moores  Register   On: 09/04/2018 15:37    No results found.  No results found.    Assessment and Plan: Patient Active Problem List   Diagnosis Date Noted  . CAD (coronary artery disease) 02/27/2018  . Sacroiliac joint pain 10/08/2017  . Pain in limb 09/24/2017  . Neuropathy 07/24/2017  . NSTEMI (non-ST elevated myocardial infarction) ([place]) 05/06/2017  . PAD (peripheral artery disease) ([place]) 05/01/2017  . History of stroke 01/02/2017  . Barrett's esophagus 10/02/2016  . Carotid stenosis 10/02/2016  . Essential hypertension 10/02/2016  . Hyperlipidemia 10/02/2016  . Diabetes ([place]) 10/02/2016  . 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 Uncontrolled  type 2 diabetes mellitus with hyperglycemia, with long-term current use of insulin ([place]) 04/10/2016  . TIA (transient ischemic attack) 10/08/2015      General Counseling: I have discussed the findings of the evaluation and examination with Marland Kitchen.  I have also discussed any further diagnostic  evaluation thatmay be needed or ordered today. Mike Decker verbalizes understanding of the findings of todays visit. We also reviewed his medications today and discussed drug interactions and side effects including but not limited excessive drowsiness and altered mental states. We also discussed that there is always a risk not just to him but also people around him. he has been encouraged to call the office with any questions or concerns that should arise related to todays visit.    Time spent: 25 1. OSA on CPAP Stable, continue to use CPAP as directed.  2. Obstructive chronic bronchitis without exacerbation ([place]) Continue current medicine regimen patient COPD is stable at time.  Patient does report increased shortness of breath with exertion and a 6-minute walk performed here in the office.  Patient did not desat however he did get tired in his legs and could no longer continue to walk.  His O2 sat remained greater than 98%.  3. Gastroesophageal reflux disease with esophagitis Well-controlled continue current medication regimen.  4. Essential hypertension Patient's blood pressure is stable continue medication as directed.  I have personally obtained a history, examined the patient, evaluated laboratory and imaging results, formulated the assessment and plan and placed orders. This patient was seen by Orson Gear AGNP-C in Collaboration with Dr. Devona Konig as a part of collaborative care agreement.    Allyne Gee, MD [facility] Pulmonary and Critical Care Sleep medicine

## 2018-10-20 NOTE — Patient Instructions (Signed)

## 2018-10-23 DIAGNOSIS — R809 Proteinuria, unspecified: Secondary | ICD-10-CM | POA: Diagnosis not present

## 2018-10-23 DIAGNOSIS — D631 Anemia in chronic kidney disease: Secondary | ICD-10-CM | POA: Diagnosis not present

## 2018-10-23 DIAGNOSIS — I129 Hypertensive chronic kidney disease with stage 1 through stage 4 chronic kidney disease, or unspecified chronic kidney disease: Secondary | ICD-10-CM | POA: Diagnosis not present

## 2018-10-23 DIAGNOSIS — E875 Hyperkalemia: Secondary | ICD-10-CM | POA: Diagnosis not present

## 2018-10-23 DIAGNOSIS — E1122 Type 2 diabetes mellitus with diabetic chronic kidney disease: Secondary | ICD-10-CM | POA: Diagnosis not present

## 2018-10-23 DIAGNOSIS — N184 Chronic kidney disease, stage 4 (severe): Secondary | ICD-10-CM | POA: Diagnosis not present

## 2018-10-24 DIAGNOSIS — I639 Cerebral infarction, unspecified: Secondary | ICD-10-CM | POA: Diagnosis not present

## 2018-10-24 DIAGNOSIS — E669 Obesity, unspecified: Secondary | ICD-10-CM | POA: Diagnosis not present

## 2018-10-24 DIAGNOSIS — I739 Peripheral vascular disease, unspecified: Secondary | ICD-10-CM | POA: Diagnosis not present

## 2018-10-24 DIAGNOSIS — I10 Essential (primary) hypertension: Secondary | ICD-10-CM | POA: Diagnosis not present

## 2018-10-24 DIAGNOSIS — I509 Heart failure, unspecified: Secondary | ICD-10-CM | POA: Diagnosis not present

## 2018-10-24 DIAGNOSIS — I208 Other forms of angina pectoris: Secondary | ICD-10-CM | POA: Diagnosis not present

## 2018-10-24 DIAGNOSIS — N182 Chronic kidney disease, stage 2 (mild): Secondary | ICD-10-CM | POA: Diagnosis not present

## 2018-10-24 DIAGNOSIS — E7849 Other hyperlipidemia: Secondary | ICD-10-CM | POA: Diagnosis not present

## 2018-10-24 DIAGNOSIS — K219 Gastro-esophageal reflux disease without esophagitis: Secondary | ICD-10-CM | POA: Diagnosis not present

## 2018-10-24 DIAGNOSIS — R0602 Shortness of breath: Secondary | ICD-10-CM | POA: Diagnosis not present

## 2018-10-24 DIAGNOSIS — J329 Chronic sinusitis, unspecified: Secondary | ICD-10-CM | POA: Diagnosis not present

## 2018-10-24 DIAGNOSIS — I2510 Atherosclerotic heart disease of native coronary artery without angina pectoris: Secondary | ICD-10-CM | POA: Diagnosis not present

## 2018-11-03 DIAGNOSIS — N2581 Secondary hyperparathyroidism of renal origin: Secondary | ICD-10-CM | POA: Diagnosis not present

## 2018-11-03 DIAGNOSIS — E1122 Type 2 diabetes mellitus with diabetic chronic kidney disease: Secondary | ICD-10-CM | POA: Diagnosis not present

## 2018-11-03 DIAGNOSIS — N183 Chronic kidney disease, stage 3 (moderate): Secondary | ICD-10-CM | POA: Diagnosis not present

## 2018-11-03 DIAGNOSIS — I129 Hypertensive chronic kidney disease with stage 1 through stage 4 chronic kidney disease, or unspecified chronic kidney disease: Secondary | ICD-10-CM | POA: Diagnosis not present

## 2018-11-03 DIAGNOSIS — R809 Proteinuria, unspecified: Secondary | ICD-10-CM | POA: Diagnosis not present

## 2018-11-04 DIAGNOSIS — G629 Polyneuropathy, unspecified: Secondary | ICD-10-CM | POA: Diagnosis not present

## 2018-11-04 DIAGNOSIS — I119 Hypertensive heart disease without heart failure: Secondary | ICD-10-CM | POA: Diagnosis not present

## 2018-11-04 DIAGNOSIS — Z0000 Encounter for general adult medical examination without abnormal findings: Secondary | ICD-10-CM | POA: Diagnosis not present

## 2018-11-04 DIAGNOSIS — K5732 Diverticulitis of large intestine without perforation or abscess without bleeding: Secondary | ICD-10-CM | POA: Diagnosis not present

## 2018-11-04 DIAGNOSIS — I10 Essential (primary) hypertension: Secondary | ICD-10-CM | POA: Diagnosis not present

## 2018-11-08 DIAGNOSIS — M5442 Lumbago with sciatica, left side: Secondary | ICD-10-CM | POA: Diagnosis not present

## 2018-11-12 DIAGNOSIS — M5430 Sciatica, unspecified side: Secondary | ICD-10-CM | POA: Insufficient documentation

## 2018-11-13 DIAGNOSIS — M533 Sacrococcygeal disorders, not elsewhere classified: Secondary | ICD-10-CM | POA: Diagnosis not present

## 2018-11-13 DIAGNOSIS — M5432 Sciatica, left side: Secondary | ICD-10-CM | POA: Diagnosis not present

## 2018-11-14 ENCOUNTER — Emergency Department: Payer: Medicare Other

## 2018-11-14 ENCOUNTER — Other Ambulatory Visit: Payer: Self-pay

## 2018-11-14 ENCOUNTER — Encounter: Payer: Self-pay | Admitting: *Deleted

## 2018-11-14 ENCOUNTER — Inpatient Hospital Stay
Admission: EM | Admit: 2018-11-14 | Discharge: 2018-11-17 | DRG: 280 | Disposition: A | Discharge status: Home / Self Care | Payer: Medicare Other | Attending: Internal Medicine | Admitting: Internal Medicine

## 2018-11-14 DIAGNOSIS — I10 Essential (primary) hypertension: Secondary | ICD-10-CM | POA: Diagnosis not present

## 2018-11-14 DIAGNOSIS — D631 Anemia in chronic kidney disease: Secondary | ICD-10-CM | POA: Diagnosis present

## 2018-11-14 DIAGNOSIS — E785 Hyperlipidemia, unspecified: Secondary | ICD-10-CM | POA: Diagnosis present

## 2018-11-14 DIAGNOSIS — I2510 Atherosclerotic heart disease of native coronary artery without angina pectoris: Secondary | ICD-10-CM | POA: Diagnosis present

## 2018-11-14 DIAGNOSIS — G4733 Obstructive sleep apnea (adult) (pediatric): Secondary | ICD-10-CM | POA: Diagnosis present

## 2018-11-14 DIAGNOSIS — E875 Hyperkalemia: Secondary | ICD-10-CM | POA: Diagnosis present

## 2018-11-14 DIAGNOSIS — Z136 Encounter for screening for cardiovascular disorders: Secondary | ICD-10-CM | POA: Diagnosis not present

## 2018-11-14 DIAGNOSIS — Z7902 Long term (current) use of antithrombotics/antiplatelets: Secondary | ICD-10-CM

## 2018-11-14 DIAGNOSIS — M109 Gout, unspecified: Secondary | ICD-10-CM | POA: Diagnosis present

## 2018-11-14 DIAGNOSIS — I5033 Acute on chronic diastolic (congestive) heart failure: Secondary | ICD-10-CM | POA: Diagnosis present

## 2018-11-14 DIAGNOSIS — N183 Chronic kidney disease, stage 3 (moderate): Secondary | ICD-10-CM | POA: Diagnosis present

## 2018-11-14 DIAGNOSIS — I214 Non-ST elevation (NSTEMI) myocardial infarction: Secondary | ICD-10-CM | POA: Diagnosis not present

## 2018-11-14 DIAGNOSIS — Z85828 Personal history of other malignant neoplasm of skin: Secondary | ICD-10-CM

## 2018-11-14 DIAGNOSIS — Z888 Allergy status to other drugs, medicaments and biological substances status: Secondary | ICD-10-CM

## 2018-11-14 DIAGNOSIS — Z8701 Personal history of pneumonia (recurrent): Secondary | ICD-10-CM | POA: Diagnosis not present

## 2018-11-14 DIAGNOSIS — C61 Malignant neoplasm of prostate: Secondary | ICD-10-CM | POA: Diagnosis present

## 2018-11-14 DIAGNOSIS — R0602 Shortness of breath: Secondary | ICD-10-CM | POA: Diagnosis not present

## 2018-11-14 DIAGNOSIS — Z823 Family history of stroke: Secondary | ICD-10-CM

## 2018-11-14 DIAGNOSIS — R809 Proteinuria, unspecified: Secondary | ICD-10-CM | POA: Diagnosis not present

## 2018-11-14 DIAGNOSIS — Z87891 Personal history of nicotine dependence: Secondary | ICD-10-CM

## 2018-11-14 DIAGNOSIS — I161 Hypertensive emergency: Secondary | ICD-10-CM | POA: Diagnosis present

## 2018-11-14 DIAGNOSIS — Z91030 Bee allergy status: Secondary | ICD-10-CM

## 2018-11-14 DIAGNOSIS — E1122 Type 2 diabetes mellitus with diabetic chronic kidney disease: Secondary | ICD-10-CM | POA: Diagnosis not present

## 2018-11-14 DIAGNOSIS — Z886 Allergy status to analgesic agent status: Secondary | ICD-10-CM

## 2018-11-14 DIAGNOSIS — Z79891 Long term (current) use of opiate analgesic: Secondary | ICD-10-CM

## 2018-11-14 DIAGNOSIS — Z880 Allergy status to penicillin: Secondary | ICD-10-CM

## 2018-11-14 DIAGNOSIS — J189 Pneumonia, unspecified organism: Secondary | ICD-10-CM

## 2018-11-14 DIAGNOSIS — I509 Heart failure, unspecified: Secondary | ICD-10-CM | POA: Diagnosis not present

## 2018-11-14 DIAGNOSIS — K5790 Diverticulosis of intestine, part unspecified, without perforation or abscess without bleeding: Secondary | ICD-10-CM | POA: Diagnosis present

## 2018-11-14 DIAGNOSIS — E1151 Type 2 diabetes mellitus with diabetic peripheral angiopathy without gangrene: Secondary | ICD-10-CM | POA: Diagnosis present

## 2018-11-14 DIAGNOSIS — I130 Hypertensive heart and chronic kidney disease with heart failure and stage 1 through stage 4 chronic kidney disease, or unspecified chronic kidney disease: Secondary | ICD-10-CM | POA: Diagnosis present

## 2018-11-14 DIAGNOSIS — C4490 Unspecified malignant neoplasm of skin, unspecified: Secondary | ICD-10-CM | POA: Diagnosis present

## 2018-11-14 DIAGNOSIS — Z794 Long term (current) use of insulin: Secondary | ICD-10-CM

## 2018-11-14 DIAGNOSIS — E1142 Type 2 diabetes mellitus with diabetic polyneuropathy: Secondary | ICD-10-CM | POA: Diagnosis present

## 2018-11-14 DIAGNOSIS — R079 Chest pain, unspecified: Secondary | ICD-10-CM | POA: Diagnosis not present

## 2018-11-14 DIAGNOSIS — R0789 Other chest pain: Secondary | ICD-10-CM | POA: Diagnosis not present

## 2018-11-14 DIAGNOSIS — I252 Old myocardial infarction: Secondary | ICD-10-CM | POA: Diagnosis not present

## 2018-11-14 DIAGNOSIS — I129 Hypertensive chronic kidney disease with stage 1 through stage 4 chronic kidney disease, or unspecified chronic kidney disease: Secondary | ICD-10-CM | POA: Diagnosis not present

## 2018-11-14 DIAGNOSIS — Z955 Presence of coronary angioplasty implant and graft: Secondary | ICD-10-CM

## 2018-11-14 DIAGNOSIS — Z7982 Long term (current) use of aspirin: Secondary | ICD-10-CM

## 2018-11-14 DIAGNOSIS — Z8719 Personal history of other diseases of the digestive system: Secondary | ICD-10-CM

## 2018-11-14 DIAGNOSIS — R0902 Hypoxemia: Secondary | ICD-10-CM | POA: Diagnosis not present

## 2018-11-14 DIAGNOSIS — S229XXA Fracture of bony thorax, part unspecified, initial encounter for closed fracture: Secondary | ICD-10-CM | POA: Diagnosis not present

## 2018-11-14 DIAGNOSIS — K2270 Barrett's esophagus without dysplasia: Secondary | ICD-10-CM | POA: Diagnosis present

## 2018-11-14 DIAGNOSIS — Z7951 Long term (current) use of inhaled steroids: Secondary | ICD-10-CM

## 2018-11-14 DIAGNOSIS — Z887 Allergy status to serum and vaccine status: Secondary | ICD-10-CM

## 2018-11-14 LAB — BASIC METABOLIC PANEL
Anion gap: 11 (ref 5–15)
BUN: 44 mg/dL — ABNORMAL HIGH (ref 8–23)
CHLORIDE: 105 mmol/L (ref 98–111)
CO2: 18 mmol/L — ABNORMAL LOW (ref 22–32)
Calcium: 9 mg/dL (ref 8.9–10.3)
Creatinine, Ser: 1.96 mg/dL — ABNORMAL HIGH (ref 0.61–1.24)
GFR calc Af Amer: 37 mL/min — ABNORMAL LOW (ref >60)
GFR, EST NON AFRICAN AMERICAN: 32 mL/min — AB (ref >60)
Glucose, Bld: 251 mg/dL — ABNORMAL HIGH (ref 70–99)
Potassium: 5.4 mmol/L — ABNORMAL HIGH (ref 3.5–5.1)
Sodium: 134 mmol/L — ABNORMAL LOW (ref 135–145)

## 2018-11-14 LAB — CBC
HEMATOCRIT: 30.1 % — AB (ref 39.0–52.0)
HEMOGLOBIN: 9.3 g/dL — AB (ref 13.0–17.0)
MCH: 26.3 pg (ref 26.0–34.0)
MCHC: 30.9 g/dL (ref 30.0–36.0)
MCV: 85 fL (ref 80.0–100.0)
NRBC: 0 % (ref 0.0–0.2)
Platelets: 350 10*3/uL (ref 150–400)
RBC: 3.54 MIL/uL — ABNORMAL LOW (ref 4.22–5.81)
RDW: 15.4 % (ref 11.5–15.5)
WBC: 12.4 10*3/uL — ABNORMAL HIGH (ref 4.0–10.5)

## 2018-11-14 LAB — TROPONIN I: TROPONIN I: 0.05 ng/mL — AB (ref <0.03)

## 2018-11-14 LAB — PROTIME-INR
INR: 0.98
PROTHROMBIN TIME: 12.9 s (ref 11.4–15.2)

## 2018-11-14 LAB — BRAIN NATRIURETIC PEPTIDE: B NATRIURETIC PEPTIDE 5: 231 pg/mL — AB (ref 0.0–100.0)

## 2018-11-14 LAB — APTT: APTT: 33 s (ref 24–36)

## 2018-11-14 MED ORDER — FENTANYL CITRATE (PF) 100 MCG/2ML IJ SOLN
50.0000 ug | Freq: Once | INTRAMUSCULAR | Status: AC
  Administered 2018-11-14: 50 ug via INTRAVENOUS
  Filled 2018-11-14: qty 2

## 2018-11-14 MED ORDER — FUROSEMIDE 10 MG/ML IJ SOLN
40.0000 mg | Freq: Once | INTRAMUSCULAR | Status: AC
  Administered 2018-11-14: 40 mg via INTRAVENOUS
  Filled 2018-11-14: qty 4

## 2018-11-14 MED ORDER — SODIUM CHLORIDE 0.9 % IV BOLUS: 500.0000 mL | Freq: Once | INTRAVENOUS | Status: DC

## 2018-11-14 NOTE — ED Triage Notes (Signed)
Pt to ED via EMS reporting centralized chest pain and left arm pain. Hx of MI and 1 stent placed. No SOB or increased WOB noted upon arrival. SOB upon exertion.  NO oxygen used at home. EMS placed 3L oxygen after a saturation of 89% Hx of CHF 1.5 inch Nitro paste applied by EMS. Pt reports having taken 3 SL Nitro before calling EMS.

## 2018-11-14 NOTE — ED Provider Notes (Signed)
[facility] Emergency Department Provider Note  ____________________________________________   I have reviewed the triage vital signs and the nursing notes. Where available I have reviewed prior notes and, if possible and indicated, outside hospital notes.    HISTORY  Chief Complaint Chest Pain    HPI Mike Decker is a 77 y.o. male   with a history of ACS in the past with stents placed, no known history of CHF but has had dyspnea on exertion, and some degree of orthopnea over the last several days.  States that he is here because he began to have chest pain which radiated to his left arm and hour prior to coming in.  He states it is similar to his prior angina.  It is a pressure-like pain started at rest, EMS came to see him.  He had already tried his nitroglycerin and they gave him and Nitropaste.  Blood pressure was somewhat elevated.  States that he would not not suffering from pleuritic pain.  He does not have significant lower extremity edema, he states that his pain is "not too bad at this time" "nearly gone" but declined to give a number.  No other alleviating or aggravating factors, did receive aspirin on the way in.    Past Medical History:  Diagnosis Date  . Arthritis   . Atrioventricular canal (AVC)    irregular heart beats  . Barrett esophagus   . Cancer [facility]) 2002   prostate  . Colon polyp   . Diabetes mellitus without complication ([place])   . Diverticulosis   . Gout   . Heart disease   . Hemangioma    liver  . Hyperlipidemia   . Hypertension   . Myocardial infarct ([place])   . Ocular hypertension   . Peripheral vascular disease ([place])   . Skin cancer   . Sleep apnea   . Vitreoretinal degeneration     Patient Active Problem List   Diagnosis Date Noted  . CAD (coronary artery disease) 02/27/2018  . Sacroiliac joint pain 10/08/2017  . Pain in limb 09/24/2017  . Neuropathy 07/24/2017  . NSTEMI (non-ST elevated myocardial infarction)  ([place]) 05/06/2017  . PAD (peripheral artery disease) ([place]) 05/01/2017  . History of stroke 01/02/2017  . Barrett's esophagus 10/02/2016  . Carotid stenosis 10/02/2016  . Essential hypertension 10/02/2016  . Hyperlipidemia 10/02/2016  . Diabetes ([place]) 10/02/2016  . 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 Uncontrolled type 2 diabetes mellitus with hyperglycemia, with long-term current use of insulin ([place]) 04/10/2016  . TIA (transient ischemic attack) 10/08/2015    Past Surgical History:  Procedure Laterality Date  . CATARACT EXTRACTION  2011, 2012  . CORONARY ANGIOPLASTY WITH STENT PLACEMENT  2012  . ESOPHAGOGASTRODUODENOSCOPY (EGD) WITH PROPOFOL N/A 06/01/2016   Procedure: ESOPHAGOGASTRODUODENOSCOPY (EGD) WITH PROPOFOL;  Surgeon: Manya Silvas, MD;  Location: [facility] ENDOSCOPY;  Service: Endoscopy;  Laterality: N/A;  . HERNIA REPAIR     x2  . INTRAOCULAR LENS INSERTION    . LEFT HEART CATH AND CORONARY ANGIOGRAPHY N/A 05/09/2017   Procedure: Left Heart Cath and Coronary Angiography;  Surgeon: Yolonda Kida, MD;  Location: [place] CV LAB;  Service: Cardiovascular;  Laterality: N/A;  . NOSE SURGERY     submucous resection  . PROSTATE SURGERY  2002  . ROTATOR CUFF REPAIR Right     Prior to Admission medications   Medication Sig Start Date End Date Taking? Authorizing Provider  acetaminophen (TYLENOL) 500 MG  tablet Take 1,000 mg by mouth every 6 (six) hours as needed for mild pain, moderate pain or headache.   Yes [provider]  allopurinol (ZYLOPRIM) 100 MG tablet Take 200 mg by mouth 2 (two) times daily.    Yes [provider]  amLODipine (NORVASC) 5 MG tablet TAKE 1 TABLET (5 MG TOTAL) BY MOUTH 2 (TWO) TIMES DAILY. 04/02/17  Yes [provider]  aspirin (ASPIRIN CHILDRENS) 81 MG chewable tablet Chew 1 tablet (81 mg total) by mouth daily. 10/09/15  Yes Mody, Ulice Bold, MD  budesonide-formoterol (SYMBICORT) 160-4.5 MCG/ACT  inhaler Inhale 2 puffs into the lungs 2 (two) times daily.   Yes [provider]  cetirizine (ZYRTEC) 5 MG tablet Take 5 mg by mouth daily as needed for allergies or rhinitis.    Yes [provider]  Cholecalciferol (VITAMIN D3) 50000 UNITS CAPS Take 50,000 Units by mouth every 30 (thirty) days.   Yes [provider]  citalopram (CELEXA) 10 MG tablet Take 10 mg by mouth daily.   Yes [provider]  clopidogrel (PLAVIX) 75 MG tablet Take 1 tablet (75 mg total) by mouth daily. 10/09/15  Yes Mody, Ulice Bold, MD  colchicine 0.6 MG tablet Take 0.6 mg by mouth 3 (three) times daily.   Yes [provider]  dextromethorphan-guaiFENesin (MUCINEX DM) 30-600 MG 12hr tablet Take 1 tablet by mouth 2 (two) times daily as needed for cough.   Yes [provider]  EPINEPHrine (EPI-PEN) 0.3 mg/0.3 mL SOAJ injection Inject into the muscle as needed (anaphylaxis).    Yes [provider]  fluticasone (FLONASE) 50 MCG/ACT nasal spray Place 2 sprays into both nostrils daily.  11/04/18  Yes [provider]  furosemide (LASIX) 20 MG tablet Take 2 tablets (40 mg total) by mouth 2 (two) times daily. Patient taking differently: Take 40 mg by mouth daily.  05/15/17  Yes Sudini, Alveta Heimlich, MD  insulin aspart (NOVOLOG FLEXPEN) 100 UNIT/ML FlexPen Inject 20-32 Units into the skin See admin instructions. Inject 14 units every morning with breakfast, 24 units at lunch and 24 units at dinner, follow sliding scale for the rest of meals. 08/30/15  Yes [provider]  insulin glargine (LANTUS) 100 UNIT/ML injection Inject 62 Units into the skin at bedtime.    Yes [provider]  isosorbide mononitrate (IMDUR) 120 MG 24 hr tablet Take 1 tablet (120 mg total) by mouth daily. 10/09/15  Yes Mody, Ulice Bold, MD  losartan (COZAAR) 25 MG tablet Take 25 mg by mouth daily. 10/23/18  Yes [provider]  magnesium oxide (MAG-OX) 400 MG tablet Take 400 mg by  mouth daily.   Yes [provider]  methocarbamol (ROBAXIN) 500 MG tablet Take 500 mg by mouth 2 (two) times daily.  11/08/18  Yes [provider]  methylPREDNISolone (MEDROL DOSEPAK) 4 MG TBPK tablet TAKE 6 TABLETS BY MOUTH DAY 1, THEN DECREASE BY 1 TABLET EACH DAY UNTIL GONE (6,5,4,3,2,1) 08/28/18  Yes [provider]  nitroGLYCERIN (NITROSTAT) 0.4 MG SL tablet Place 0.4 mg under the tongue every 5 (five) minutes x 3 doses as needed for chest pain.    Yes [provider]  Omega-3 Fatty Acids (FISH OIL) 1000 MG CAPS Take 1 capsule by mouth daily.    Yes [provider]  pantoprazole (PROTONIX) 40 MG tablet Take 40 mg by mouth 2 (two) times daily.   Yes [provider]  polyethylene glycol powder (GLYCOLAX/MIRALAX) powder Use as directed 01/20/15  Yes [provider]  rosuvastatin (CRESTOR) 40 MG tablet Take 40 mg by mouth daily.   Yes [provider]  traMADol (ULTRAM) 50 MG tablet Take 50 mg by mouth every 4 (four) hours as needed.   Yes [provider]  Vitamin D, Ergocalciferol, (DRISDOL) 50000 units CAPS capsule TAKE 1 CAPSULE BY MOUTH ONCE A MONTH 07/30/17  Yes [provider]  CARAFATE 1 GM/10ML suspension TAKE 2 TEASPOONSFUL (10MLS) BY MOUTH 4 TIMES A DAY BEFORE MEALS AND NIGHTLY FOR 14 DAYS 03/17/17   [provider]  chlorpheniramine-HYDROcodone ([place]) 10-8 MG/5ML SUER TAKE 1/2 TEASPOONFUL BY MOUTH TWICE A DAY 08/05/17   [provider]  Dimethyl Ether (COMPOUND W COMPLETE) KIT Take 15 mL three times daily before meals for the first 2 days following ablation treatment and then every 4 hours ONLY as needed thereafter. 08/29/17   [provider]  gabapentin (NEURONTIN) 100 MG capsule TAKE 1 CAPSULE THREE TIMES DAILY FOR 1 WEEK, THEN INCREASE TO 2 CAPSULES THREE TIMES DAILY 07/24/17   [provider]  ipratropium-albuterol (DUONEB) 0.5-2.5 (3) MG/3ML SOLN Take 3 mLs by  nebulization every 4 (four) hours as needed. Patient not taking: Reported on 11/14/2018 05/15/17   Hillary Bow, MD  levofloxacin (LEVAQUIN) 500 MG tablet Take 1 tablet (500 mg total) by mouth daily. Patient not taking: Reported on 11/14/2018 09/04/18   Allyne Gee, MD  magnesium gluconate (MAGONATE) 500 MG tablet Take 500 mg by mouth daily.    [provider]  metoprolol tartrate 75 MG TABS Take 75 mg by mouth 2 (two) times daily. Patient taking differently: Take 50 mg by mouth 2 (two) times daily.  05/15/17   Hillary Bow, MD  oxyCODONE (ROXICODONE) 5 MG/5ML solution Take by mouth. 08/29/17   [provider]  oxyCODONE-acetaminophen (ROXICET) 5-325 MG tablet Take 1 tablet by mouth every 6 (six) hours as needed. Patient not taking: Reported on 11/14/2018 09/22/17   Harvest Dark, MD    Allergies Gabapentin; Peanut-containing drug products; Penicillins; Bee venom; Influenza vaccines; Inh [isoniazid]; Kenalog [triamcinolone acetonide]; Levaquin [levofloxacin]; Nalfon [fenoprofen calcium]; Naproxen; Peanut oil; and Nsaids  Family History  Problem Relation Age of Onset  . Stroke Maternal Grandfather     Social History Social History   Tobacco Use  . Smoking status: Former Smoker    Years: 20.00    Types: Cigarettes    Last attempt to quit: 12/10/1976    Years since quitting: 41.9  . Smokeless tobacco: Never Used  Substance Use Topics  . Alcohol use: No  . Drug use: No    Review of Systems Constitutional: No fever/chills Eyes: No visual changes. ENT: No sore throat. No stiff neck no neck pain Cardiovascular: + chest pain. Respiratory: + shortness of breath. Gastrointestinal:   no vomiting.  No diarrhea.  No constipation. Genitourinary: Negative for dysuria. Musculoskeletal: Negative lower extremity swelling Skin: Negative for rash. Neurological: Negative for severe headaches, focal weakness or  numbness.   ____________________________________________   PHYSICAL EXAM:  VITAL SIGNS: ED Triage Vitals  Enc Vitals Group     BP 11/14/18 2138 (!) 192/73     Pulse Rate 11/14/18 2138 94     Resp 11/14/18 2138 15     Temp 11/14/18 2138 98.4 F (36.9 C)     Temp Source 11/14/18 2138 Oral     SpO2 11/14/18 2138 96 %     Weight 11/14/18 2132 197 lb (89.4 kg)     Height 11/14/18 2132 '5\' 6"'  (1.676  m)     Head Circumference --      Peak Flow --      Pain Score 11/14/18 2132 5     Pain Loc --      Pain Edu? --      Excl. in [place]? --     Constitutional: Alert and oriented. Well appearing and in no acute distress. Eyes: Conjunctivae are normal Head: Atraumatic HEENT: No congestion/rhinnorhea. Mucous membranes are moist.  Oropharynx non-erythematous Neck:   Nontender with no meningismus, no masses, no stridor Cardiovascular: Normal rate, regular rhythm. Grossly normal heart sounds.  Good peripheral circulation. Respiratory: Normal respiratory effort.  No retractions. Lungs CTAB. Abdominal: Soft and nontender. No distention. No guarding no rebound Back:  There is no focal tenderness or step off.  there is no midline tenderness there are no lesions noted. there is no CVA tenderness Musculoskeletal: No lower extremity tenderness, no upper extremity tenderness. No joint effusions, no DVT signs strong distal pulses no edema Neurologic:  Normal speech and language. No gross focal neurologic deficits are appreciated.  Skin:  Skin is warm, dry and intact. No rash noted. Psychiatric: Mood and affect are normal. Speech and behavior are normal.  ____________________________________________   LABS (all labs ordered are listed, but only abnormal results are displayed)  Labs Reviewed  BASIC METABOLIC PANEL - Abnormal; Notable for the following components:      Result Value   Sodium 134 (*)    Potassium 5.4 (*)    CO2 18 (*)    Glucose, Bld 251 (*)    BUN 44 (*)    Creatinine, Ser 1.96  (*)    GFR calc non Af Amer 32 (*)    GFR calc Af Amer 37 (*)    All other components within normal limits  CBC - Abnormal; Notable for the following components:   WBC 12.4 (*)    RBC 3.54 (*)    Hemoglobin 9.3 (*)    HCT 30.1 (*)    All other components within normal limits  TROPONIN I - Abnormal; Notable for the following components:   Troponin I 0.05 (*)    All other components within normal limits  BRAIN NATRIURETIC PEPTIDE - Abnormal; Notable for the following components:   B Natriuretic Peptide 231.0 (*)    All other components within normal limits  PROTIME-INR  APTT    Pertinent labs  results that were available during my care of the patient were reviewed by me and considered in my medical decision making (see chart for details). ____________________________________________  EKG  I personally interpreted any EKGs ordered by me or triage 2 different EKGs were obtained   First EKG shows sinus rhythm, rate 94 bpm no acute ST elevation or depression, nonspecific ST changes. Second EKG shows at 1038 sinus rhythm, rate 81, normal axis no acute ST elevation or depression, persistent RSR prime configuration, no STEMI_____  RADIOLOGY  Pertinent labs & imaging results that were available during my care of the patient were reviewed by me and considered in my medical decision making (see chart for details). If possible, patient and/or family made aware of any abnormal findings.  Dg Chest Portable 1 View  Result Date: 11/14/2018 CLINICAL DATA:  Central chest and left arm pain. EXAM: PORTABLE CHEST 1 VIEW COMPARISON:  09/04/2018 FINDINGS: The heart size and mediastinal contours are within normal limits. Both lungs are clear. The visualized skeletal structures are unremarkable. IMPRESSION: No active disease. Electronically Signed   By: Shanon Brow  Randel Pigg M.D.   On: 11/14/2018 22:23   ____________________________________________    PROCEDURES  Procedure(s) performed:  None  Procedures  Critical Care performed: None  ____________________________________________   INITIAL IMPRESSION / ASSESSMENT AND PLAN / ED COURSE  Pertinent labs & imaging results that were available during my care of the patient were reviewed by me and considered in my medical decision making (see chart for details).  Patient here with chest pain which is very concerning for being similar to prior angina, he has a initial troponin which is borderline we have given him fentanyl and he already had nitroglycerin pain is now gone.  He is pain-free.  He has received aspirin from EMS, he has no evidence of ST elevation or ST depression significantly on EKG, and he feels much better.  Blood pressure is trending down as well.  BNP is somewhat elevated, but has been worse, chest x-ray is clear, nothing to suggest PE or dissection, this is a chest pressure radiating to the arm which has been coming and going gradually for last couple weeks.  He was scheduled for an outpatient stress test but has not been able to make it.  I have admitted him to the hospitalist service.  Troponin is borderline, but serial EKGs are reassuring, he is pain-free at this time    ____________________________________________   FINAL CLINICAL IMPRESSION(S) / ED DIAGNOSES  Final diagnoses:  Chest pain, unspecified type      This chart was dictated using voice recognition software.  Despite best efforts to proofread,  errors can occur which can change meaning.      Schuyler Amor, MD 11/14/18 [phone]

## 2018-11-14 NOTE — ED Notes (Signed)
Pt urinating att

## 2018-11-14 NOTE — ED Notes (Signed)
Parsa Rickett, [phone] home, [phone] cell, call with room assignment

## 2018-11-14 NOTE — ED Notes (Signed)
CRITICAL LAB: TROPONIN is 0.05, [employer], Dr. Burlene Arnt notified, orders received

## 2018-11-15 ENCOUNTER — Inpatient Hospital Stay
Admit: 2018-11-15 | Discharge: 2018-11-15 | Disposition: A | Discharge status: Home / Self Care | Payer: Medicare Other | Attending: Internal Medicine | Admitting: Internal Medicine

## 2018-11-15 ENCOUNTER — Other Ambulatory Visit: Payer: Self-pay

## 2018-11-15 LAB — LIPID PANEL
Cholesterol: 112 mg/dL (ref 0–200)
HDL: 27 mg/dL — ABNORMAL LOW (ref >40)
LDL Cholesterol: 51 mg/dL (ref 0–99)
Total CHOL/HDL Ratio: 4.1 RATIO
Triglycerides: 171 mg/dL — ABNORMAL HIGH (ref <150)
VLDL: 34 mg/dL (ref 0–40)

## 2018-11-15 LAB — GLUCOSE, CAPILLARY
GLUCOSE-CAPILLARY: 246 mg/dL — AB (ref 70–99)
Glucose-Capillary: 233 mg/dL — ABNORMAL HIGH (ref 70–99)
Glucose-Capillary: 210 mg/dL — ABNORMAL HIGH (ref 70–99)
Glucose-Capillary: 210 mg/dL — ABNORMAL HIGH (ref 70–99)
Glucose-Capillary: 198 mg/dL — ABNORMAL HIGH (ref 70–99)

## 2018-11-15 LAB — BASIC METABOLIC PANEL
Anion gap: 8 (ref 5–15)
BUN: 46 mg/dL — ABNORMAL HIGH (ref 8–23)
CO2: 20 mmol/L — ABNORMAL LOW (ref 22–32)
CREATININE: 2 mg/dL — AB (ref 0.61–1.24)
Calcium: 8.8 mg/dL — ABNORMAL LOW (ref 8.9–10.3)
Chloride: 106 mmol/L (ref 98–111)
GFR calc Af Amer: 36 mL/min — ABNORMAL LOW (ref >60)
GFR calc non Af Amer: 31 mL/min — ABNORMAL LOW (ref >60)
Glucose, Bld: 267 mg/dL — ABNORMAL HIGH (ref 70–99)
Potassium: 5.5 mmol/L — ABNORMAL HIGH (ref 3.5–5.1)
Sodium: 134 mmol/L — ABNORMAL LOW (ref 135–145)

## 2018-11-15 LAB — ECHOCARDIOGRAM COMPLETE
Height: 66 in
Weight: 3158.4 oz

## 2018-11-15 LAB — HEMOGLOBIN A1C
Hgb A1c MFr Bld: 7.2 % — ABNORMAL HIGH (ref 4.8–5.6)
MEAN PLASMA GLUCOSE: 159.94 mg/dL

## 2018-11-15 LAB — CBC
HCT: 28.9 % — ABNORMAL LOW (ref 39.0–52.0)
Hemoglobin: 8.6 g/dL — ABNORMAL LOW (ref 13.0–17.0)
MCH: 25.7 pg — ABNORMAL LOW (ref 26.0–34.0)
MCHC: 29.8 g/dL — ABNORMAL LOW (ref 30.0–36.0)
MCV: 86.3 fL (ref 80.0–100.0)
Platelets: 335 10*3/uL (ref 150–400)
RBC: 3.35 MIL/uL — ABNORMAL LOW (ref 4.22–5.81)
RDW: 15.4 % (ref 11.5–15.5)
WBC: 11.3 10*3/uL — ABNORMAL HIGH (ref 4.0–10.5)
nRBC: 0 % (ref 0.0–0.2)

## 2018-11-15 LAB — TROPONIN I
Troponin I: 3.65 ng/mL (ref <0.03)
Troponin I: 6.06 ng/mL (ref <0.03)
Troponin I: 5.11 ng/mL (ref <0.03)

## 2018-11-15 LAB — HEPARIN LEVEL (UNFRACTIONATED): Heparin Unfractionated: 0.47 IU/mL (ref 0.30–0.70)

## 2018-11-15 MED ORDER — LORATADINE 10 MG PO TABS
10.0000 mg | ORAL_TABLET | Freq: Every day | ORAL | Status: DC
  Administered 2018-11-15 – 2018-11-17 (×3): 10 mg via ORAL
  Filled 2018-11-15 (×3): qty 1

## 2018-11-15 MED ORDER — FUROSEMIDE 10 MG/ML IJ SOLN
40.0000 mg | Freq: Two times a day (BID) | INTRAMUSCULAR | Status: DC
  Administered 2018-11-15: 40 mg via INTRAVENOUS
  Filled 2018-11-15: qty 4

## 2018-11-15 MED ORDER — METHOCARBAMOL 500 MG PO TABS
500.0000 mg | ORAL_TABLET | Freq: Two times a day (BID) | ORAL | Status: DC
  Administered 2018-11-15 – 2018-11-17 (×5): 500 mg via ORAL
  Filled 2018-11-15 (×6): qty 1

## 2018-11-15 MED ORDER — ONDANSETRON HCL 4 MG PO TABS: 4.0000 mg | ORAL_TABLET | Freq: Four times a day (QID) | ORAL | Status: DC | PRN

## 2018-11-15 MED ORDER — MAGNESIUM OXIDE 400 (241.3 MG) MG PO TABS
400.0000 mg | ORAL_TABLET | Freq: Every day | ORAL | Status: DC
  Administered 2018-11-15: 400 mg via ORAL
  Filled 2018-11-15: qty 1

## 2018-11-15 MED ORDER — CLOPIDOGREL BISULFATE 75 MG PO TABS
75.0000 mg | ORAL_TABLET | Freq: Every day | ORAL | Status: DC
  Administered 2018-11-15 – 2018-11-17 (×3): 75 mg via ORAL
  Filled 2018-11-15 (×3): qty 1

## 2018-11-15 MED ORDER — ACETAMINOPHEN 325 MG PO TABS: 650.0000 mg | ORAL_TABLET | Freq: Four times a day (QID) | ORAL | Status: DC | PRN

## 2018-11-15 MED ORDER — ONDANSETRON HCL 4 MG/2ML IJ SOLN: 4.0000 mg | Freq: Four times a day (QID) | INTRAMUSCULAR | Status: DC | PRN

## 2018-11-15 MED ORDER — FUROSEMIDE 10 MG/ML IJ SOLN
40.0000 mg | Freq: Every day | INTRAMUSCULAR | Status: DC
  Administered 2018-11-16: 40 mg via INTRAVENOUS
  Filled 2018-11-15: qty 4

## 2018-11-15 MED ORDER — DEXTROSE 50 % IV SOLN
25.0000 mL | Freq: Once | INTRAVENOUS | Status: AC
  Administered 2018-11-15: 25 mL via INTRAVENOUS
  Filled 2018-11-15: qty 50

## 2018-11-15 MED ORDER — INSULIN ASPART 100 UNIT/ML IV SOLN
10.0000 [IU] | Freq: Once | INTRAVENOUS | Status: AC
  Administered 2018-11-15: 10 [IU] via INTRAVENOUS
  Filled 2018-11-15: qty 0.1

## 2018-11-15 MED ORDER — PANTOPRAZOLE SODIUM 40 MG PO TBEC
40.0000 mg | DELAYED_RELEASE_TABLET | Freq: Two times a day (BID) | ORAL | Status: DC
  Administered 2018-11-15 – 2018-11-17 (×5): 40 mg via ORAL
  Filled 2018-11-15 (×5): qty 1

## 2018-11-15 MED ORDER — IPRATROPIUM-ALBUTEROL 0.5-2.5 (3) MG/3ML IN SOLN: 3.0000 mL | RESPIRATORY_TRACT | Status: DC | PRN

## 2018-11-15 MED ORDER — VITAMIN D (ERGOCALCIFEROL) 1.25 MG (50000 UNIT) PO CAPS: 50000.0000 [IU] | ORAL_CAPSULE | ORAL | Status: DC

## 2018-11-15 MED ORDER — HEPARIN BOLUS VIA INFUSION
4000.0000 [IU] | Freq: Once | INTRAVENOUS | Status: AC
  Administered 2018-11-15: 4000 [IU] via INTRAVENOUS
  Filled 2018-11-15: qty 4000

## 2018-11-15 MED ORDER — TRAMADOL HCL 50 MG PO TABS: 50.0000 mg | ORAL_TABLET | Freq: Two times a day (BID) | ORAL | Status: DC | PRN

## 2018-11-15 MED ORDER — ACETAMINOPHEN 650 MG RE SUPP: 650.0000 mg | Freq: Four times a day (QID) | RECTAL | Status: DC | PRN

## 2018-11-15 MED ORDER — SODIUM POLYSTYRENE SULFONATE 15 GM/60ML PO SUSP
30.0000 g | Freq: Once | ORAL | Status: AC
  Administered 2018-11-15: 30 g via ORAL
  Filled 2018-11-15: qty 120

## 2018-11-15 MED ORDER — GABAPENTIN 100 MG PO CAPS
100.0000 mg | ORAL_CAPSULE | Freq: Three times a day (TID) | ORAL | Status: DC
  Filled 2018-11-15: qty 1

## 2018-11-15 MED ORDER — INSULIN ASPART 100 UNIT/ML ~~LOC~~ SOLN
0.0000 [IU] | Freq: Three times a day (TID) | SUBCUTANEOUS | Status: DC
  Administered 2018-11-15 (×2): 3 [IU] via SUBCUTANEOUS
  Administered 2018-11-15: 2 [IU] via SUBCUTANEOUS
  Administered 2018-11-16 – 2018-11-17 (×4): 3 [IU] via SUBCUTANEOUS
  Filled 2018-11-15 (×7): qty 1

## 2018-11-15 MED ORDER — TRAZODONE HCL 50 MG PO TABS
25.0000 mg | ORAL_TABLET | Freq: Every evening | ORAL | Status: DC | PRN
  Administered 2018-11-15 – 2018-11-16 (×2): 25 mg via ORAL
  Filled 2018-11-15: qty 0.5
  Filled 2018-11-15: qty 1

## 2018-11-15 MED ORDER — BISACODYL 5 MG PO TBEC: 5.0000 mg | DELAYED_RELEASE_TABLET | Freq: Every day | ORAL | Status: DC | PRN

## 2018-11-15 MED ORDER — ROSUVASTATIN CALCIUM 10 MG PO TABS
40.0000 mg | ORAL_TABLET | Freq: Every day | ORAL | Status: DC
  Administered 2018-11-15 – 2018-11-16 (×2): 40 mg via ORAL
  Filled 2018-11-15 (×2): qty 4

## 2018-11-15 MED ORDER — AMLODIPINE BESYLATE 5 MG PO TABS
5.0000 mg | ORAL_TABLET | Freq: Every day | ORAL | Status: DC
  Administered 2018-11-15 – 2018-11-17 (×3): 5 mg via ORAL
  Filled 2018-11-15 (×3): qty 1

## 2018-11-15 MED ORDER — LOSARTAN POTASSIUM 25 MG PO TABS
25.0000 mg | ORAL_TABLET | Freq: Every day | ORAL | Status: DC
  Administered 2018-11-15 – 2018-11-17 (×3): 25 mg via ORAL
  Filled 2018-11-15 (×3): qty 1

## 2018-11-15 MED ORDER — ISOSORBIDE MONONITRATE ER 60 MG PO TB24
120.0000 mg | ORAL_TABLET | Freq: Every day | ORAL | Status: DC
  Administered 2018-11-15 – 2018-11-17 (×3): 120 mg via ORAL
  Filled 2018-11-15 (×3): qty 2

## 2018-11-15 MED ORDER — FLUTICASONE FUROATE-VILANTEROL 200-25 MCG/INH IN AEPB
1.0000 | INHALATION_SPRAY | Freq: Every day | RESPIRATORY_TRACT | Status: DC
  Administered 2018-11-15 – 2018-11-17 (×3): 1 via RESPIRATORY_TRACT
  Filled 2018-11-15: qty 28

## 2018-11-15 MED ORDER — MAGNESIUM OXIDE 400 (241.3 MG) MG PO TABS
400.0000 mg | ORAL_TABLET | Freq: Every day | ORAL | Status: DC
  Administered 2018-11-15 – 2018-11-17 (×3): 400 mg via ORAL
  Filled 2018-11-15 (×3): qty 1

## 2018-11-15 MED ORDER — CITALOPRAM HYDROBROMIDE 20 MG PO TABS
10.0000 mg | ORAL_TABLET | Freq: Every day | ORAL | Status: DC
  Administered 2018-11-15 – 2018-11-17 (×3): 10 mg via ORAL
  Filled 2018-11-15 (×3): qty 1

## 2018-11-15 MED ORDER — INSULIN ASPART 100 UNIT/ML ~~LOC~~ SOLN
0.0000 [IU] | Freq: Every day | SUBCUTANEOUS | Status: DC
  Administered 2018-11-15 – 2018-11-16 (×2): 2 [IU] via SUBCUTANEOUS
  Filled 2018-11-15 (×2): qty 1

## 2018-11-15 MED ORDER — ALLOPURINOL 100 MG PO TABS
200.0000 mg | ORAL_TABLET | Freq: Two times a day (BID) | ORAL | Status: DC
  Administered 2018-11-15 – 2018-11-17 (×5): 200 mg via ORAL
  Filled 2018-11-15 (×7): qty 2

## 2018-11-15 MED ORDER — HYDROCODONE-ACETAMINOPHEN 5-325 MG PO TABS: 1.0000 | ORAL_TABLET | ORAL | Status: DC | PRN

## 2018-11-15 MED ORDER — SUCRALFATE 1 GM/10ML PO SUSP
1.0000 g | Freq: Two times a day (BID) | ORAL | Status: DC
  Filled 2018-11-15 (×2): qty 10

## 2018-11-15 MED ORDER — METOPROLOL TARTRATE 50 MG PO TABS
50.0000 mg | ORAL_TABLET | Freq: Two times a day (BID) | ORAL | Status: DC
  Administered 2018-11-15 – 2018-11-17 (×6): 50 mg via ORAL
  Filled 2018-11-15 (×6): qty 1

## 2018-11-15 MED ORDER — DOCUSATE SODIUM 100 MG PO CAPS
100.0000 mg | ORAL_CAPSULE | Freq: Two times a day (BID) | ORAL | Status: DC
  Administered 2018-11-17: 100 mg via ORAL
  Filled 2018-11-15 (×5): qty 1

## 2018-11-15 MED ORDER — NITROGLYCERIN 0.4 MG SL SUBL: 0.4000 mg | SUBLINGUAL_TABLET | SUBLINGUAL | Status: DC | PRN

## 2018-11-15 MED ORDER — HEPARIN (PORCINE) 25000 UT/250ML-% IV SOLN
1000.0000 [IU]/h | INTRAVENOUS | Status: DC
  Administered 2018-11-15 – 2018-11-16 (×2): 1000 [IU]/h via INTRAVENOUS
  Filled 2018-11-15 (×2): qty 250

## 2018-11-15 MED ORDER — HEPARIN SODIUM (PORCINE) 5000 UNIT/ML IJ SOLN
5000.0000 [IU] | Freq: Three times a day (TID) | INTRAMUSCULAR | Status: DC
  Administered 2018-11-15: 5000 [IU] via SUBCUTANEOUS
  Filled 2018-11-15: qty 1

## 2018-11-15 MED ORDER — FLUTICASONE PROPIONATE 50 MCG/ACT NA SUSP
2.0000 | Freq: Every day | NASAL | Status: DC
  Administered 2018-11-15 – 2018-11-17 (×3): 2 via NASAL
  Filled 2018-11-15 (×2): qty 16

## 2018-11-15 MED ORDER — INSULIN GLARGINE 100 UNIT/ML ~~LOC~~ SOLN
40.0000 [IU] | Freq: Every day | SUBCUTANEOUS | Status: DC
  Administered 2018-11-16: 40 [IU] via SUBCUTANEOUS
  Filled 2018-11-15 (×2): qty 0.4

## 2018-11-15 NOTE — H&P (Addendum)
[facility] [place] NAME: Mike Decker    MR#:  [id number]  DATE OF BIRTH:  1940/12/24  DATE OF ADMISSION:  11/14/2018  PRIMARY CARE PHYSICIAN: Cletis Athens, MD   REQUESTING/REFERRING PHYSICIAN:   CHIEF COMPLAINT:   Chief Complaint  Patient presents with  . Chest Pain    HISTORY OF PRESENT ILLNESS: Steffen Hase  is a 77 y.o. male with a known history of coronary artery disease status post remote stents placement, diabetes, hypertension, hyperlipidemia and other comorbidities. She presented to emergency room for shortness of breath with exertion going on for the past week or so, gradually getting worse.  He also experienced chest pain, just before presenting to emergency room.  He describes the chest pain as moderate retrosternal pressure, started at rest, but worse with exertion and radiating to the left arm.  Per patient, this is similar to his prior angina episodes.  His diuretic dose was increased in the past 2 days, with minimal improvement in his symptoms. No fever or chills, no cough, no palpitations. The chest pain improved after receiving Lasix IV and nitroglycerin in the emergency room.  At the arrival to emergency room, blood pressure was noted to be elevated at 192/73. Blood test done emergency room are notable for troponin level is 0.05 creatinine level 1.96, glucose level at 251.  BNP is 231. EKG shows sinus rhythm, rate 94 bpm no acute ST elevation or depression, nonspecific ST changes. Chest x-ray is negative for acute cardiopulmonary abnormalities. Patient is admitted for further evaluation and treatment.  PAST MEDICAL HISTORY:   Past Medical History:  Diagnosis Date  . Arthritis   . Atrioventricular canal (AVC)    irregular heart beats  . Barrett esophagus   . Cancer [facility]) 2002   prostate  . Colon polyp   . Diabetes mellitus without complication ([place])   . Diverticulosis   . Gout   . Heart disease   .  Hemangioma    liver  . Hyperlipidemia   . Hypertension   . Myocardial infarct ([place])   . Ocular hypertension   . Peripheral vascular disease ([place])   . Skin cancer   . Sleep apnea   . Vitreoretinal degeneration     PAST SURGICAL HISTORY:  Past Surgical History:  Procedure Laterality Date  . CATARACT EXTRACTION  2011, 2012  . CORONARY ANGIOPLASTY WITH STENT PLACEMENT  2012  . ESOPHAGOGASTRODUODENOSCOPY (EGD) WITH PROPOFOL N/A 06/01/2016   Procedure: ESOPHAGOGASTRODUODENOSCOPY (EGD) WITH PROPOFOL;  Surgeon: Manya Silvas, MD;  Location[facility] ENDOSCOPY;  Service: Endoscopy;  Laterality: N/A;  . HERNIA REPAIR     x2  . INTRAOCULAR LENS INSERTION    . LEFT HEART CATH AND CORONARY ANGIOGRAPHY N/A 05/09/2017   Procedure: Left Heart Cath and Coronary Angiography;  Surgeon: Yolonda Kida, MD;  Location: [facility] CV LAB;  Service: Cardiovascular;  Laterality: N/A;  . NOSE SURGERY     submucous resection  . PROSTATE SURGERY  2002  . ROTATOR CUFF REPAIR Right     SOCIAL HISTORY:  Social History   Tobacco Use  . Smoking status: Former Smoker    Years: 20.00    Types: Cigarettes    Last attempt to quit: 12/10/1976    Years since quitting: 41.9  . Smokeless tobacco: Never Used  Substance Use Topics  . Alcohol use: No    FAMILY HISTORY:  Family History  Problem Relation Age of Onset  . Stroke Maternal  Grandfather     DRUG ALLERGIES:  Allergies  Allergen Reactions  . Gabapentin     Other reaction(s): Other (See Comments) Tremors  . Peanut-Containing Drug Products Anaphylaxis  . Penicillins Hives and Rash    Has patient had a PCN reaction causing immediate rash, facial/tongue/throat swelling, SOB or lightheadedness with hypotension: Yes Has patient had a PCN reaction causing severe rash involving mucus membranes or skin necrosis: No Has patient had a PCN reaction that required hospitalization No Has patient had a PCN reaction occurring within the last 10 years:  No If all of the above answers are "NO", then may proceed with Cephalosporin use.  . Bee Venom Swelling  . Influenza Vaccines Hives  . Inh [Isoniazid] Hives  . Kenalog [Triamcinolone Acetonide] Hives  . Levaquin [Levofloxacin] Other (See Comments)    Tendon, ligament pain.   . Nalfon [Fenoprofen Calcium] Hives  . Naproxen   . Peanut Oil   . Nsaids Rash    Nalfon 600 Nalfon 600    REVIEW OF SYSTEMS:   CONSTITUTIONAL: No fever, fatigue or weakness.  EYES: No changes in vision.  EARS, NOSE, AND THROAT: No tinnitus or ear pain.  RESPIRATORY: Positive for shortness of breath.  No cough, wheezing or hemoptysis.  CARDIOVASCULAR: Positive for chest pain, orthopnea, no edema.  GASTROINTESTINAL: No nausea, vomiting, diarrhea or abdominal pain.  GENITOURINARY: No dysuria, hematuria.  ENDOCRINE: No polyuria, nocturia. HEMATOLOGY: No bleeding. SKIN: No rash or lesion. MUSCULOSKELETAL: No joint pain at this time.   NEUROLOGIC: No focal weakness.  PSYCHIATRY: No anxiety or depression.   MEDICATIONS AT HOME:  Prior to Admission medications   Medication Sig Start Date End Date Taking? Authorizing Provider  acetaminophen (TYLENOL) 500 MG tablet Take 1,000 mg by mouth every 6 (six) hours as needed for mild pain, moderate pain or headache.   Yes [provider]  allopurinol (ZYLOPRIM) 100 MG tablet Take 200 mg by mouth 2 (two) times daily.    Yes [provider]  amLODipine (NORVASC) 5 MG tablet TAKE 1 TABLET (5 MG TOTAL) BY MOUTH 2 (TWO) TIMES DAILY. 04/02/17  Yes [provider]  aspirin (ASPIRIN CHILDRENS) 81 MG chewable tablet Chew 1 tablet (81 mg total) by mouth daily. 10/09/15  Yes Mody, Ulice Bold, MD  budesonide-formoterol (SYMBICORT) 160-4.5 MCG/ACT inhaler Inhale 2 puffs into the lungs 2 (two) times daily.   Yes [provider]  cetirizine (ZYRTEC) 5 MG tablet Take 5 mg by mouth daily as needed for allergies or rhinitis.    Yes [provider]   Cholecalciferol (VITAMIN D3) 50000 UNITS CAPS Take 50,000 Units by mouth every 30 (thirty) days.   Yes [provider]  citalopram (CELEXA) 10 MG tablet Take 10 mg by mouth daily.   Yes [provider]  clopidogrel (PLAVIX) 75 MG tablet Take 1 tablet (75 mg total) by mouth daily. 10/09/15  Yes Mody, Ulice Bold, MD  colchicine 0.6 MG tablet Take 0.6 mg by mouth 3 (three) times daily.   Yes [provider]  dextromethorphan-guaiFENesin (MUCINEX DM) 30-600 MG 12hr tablet Take 1 tablet by mouth 2 (two) times daily as needed for cough.   Yes [provider]  EPINEPHrine (EPI-PEN) 0.3 mg/0.3 mL SOAJ injection Inject into the muscle as needed (anaphylaxis).    Yes [provider]  fluticasone (FLONASE) 50 MCG/ACT nasal spray Place 2 sprays into both nostrils daily.  11/04/18  Yes [provider]  furosemide (LASIX) 20 MG tablet Take 2 tablets (40 mg  total) by mouth 2 (two) times daily. Patient taking differently: Take 40 mg by mouth daily.  05/15/17  Yes Sudini, Alveta Heimlich, MD  insulin aspart (NOVOLOG FLEXPEN) 100 UNIT/ML FlexPen Inject 20-32 Units into the skin See admin instructions. Inject 14 units every morning with breakfast, 24 units at lunch and 24 units at dinner, follow sliding scale for the rest of meals. 08/30/15  Yes [provider]  insulin glargine (LANTUS) 100 UNIT/ML injection Inject 62 Units into the skin at bedtime.    Yes [provider]  isosorbide mononitrate (IMDUR) 120 MG 24 hr tablet Take 1 tablet (120 mg total) by mouth daily. 10/09/15  Yes Mody, Ulice Bold, MD  losartan (COZAAR) 25 MG tablet Take 25 mg by mouth daily. 10/23/18  Yes [provider]  magnesium oxide (MAG-OX) 400 MG tablet Take 400 mg by mouth daily.   Yes [provider]  methocarbamol (ROBAXIN) 500 MG tablet Take 500 mg by mouth 2 (two) times daily.  11/08/18  Yes [provider]  methylPREDNISolone (MEDROL DOSEPAK) 4 MG TBPK tablet  TAKE 6 TABLETS BY MOUTH DAY 1, THEN DECREASE BY 1 TABLET EACH DAY UNTIL GONE (6,5,4,3,2,1) 08/28/18  Yes [provider]  nitroGLYCERIN (NITROSTAT) 0.4 MG SL tablet Place 0.4 mg under the tongue every 5 (five) minutes x 3 doses as needed for chest pain.    Yes [provider]  Omega-3 Fatty Acids (FISH OIL) 1000 MG CAPS Take 1 capsule by mouth daily.    Yes [provider]  pantoprazole (PROTONIX) 40 MG tablet Take 40 mg by mouth 2 (two) times daily.   Yes [provider]  polyethylene glycol powder (GLYCOLAX/MIRALAX) powder Use as directed 01/20/15  Yes [provider]  rosuvastatin (CRESTOR) 40 MG tablet Take 40 mg by mouth daily.   Yes [provider]  traMADol (ULTRAM) 50 MG tablet Take 50 mg by mouth every 4 (four) hours as needed.   Yes [provider]  Vitamin D, Ergocalciferol, (DRISDOL) 50000 units CAPS capsule TAKE 1 CAPSULE BY MOUTH ONCE A MONTH 07/30/17  Yes [provider]  CARAFATE 1 GM/10ML suspension TAKE 2 TEASPOONSFUL (10MLS) BY MOUTH 4 TIMES A DAY BEFORE MEALS AND NIGHTLY FOR 14 DAYS 03/17/17   [provider]  chlorpheniramine-HYDROcodone ([place]) 10-8 MG/5ML SUER TAKE 1/2 TEASPOONFUL BY MOUTH TWICE A DAY 08/05/17   [provider]  Dimethyl Ether (COMPOUND W COMPLETE) KIT Take 15 mL three times daily before meals for the first 2 days following ablation treatment and then every 4 hours ONLY as needed thereafter. 08/29/17   [provider]  gabapentin (NEURONTIN) 100 MG capsule TAKE 1 CAPSULE THREE TIMES DAILY FOR 1 WEEK, THEN INCREASE TO 2 CAPSULES THREE TIMES DAILY 07/24/17   [provider]  ipratropium-albuterol (DUONEB) 0.5-2.5 (3) MG/3ML SOLN Take 3 mLs by nebulization every 4 (four) hours as needed. Patient not taking: Reported on 11/14/2018 05/15/17   Hillary Bow, MD  levofloxacin (LEVAQUIN) 500 MG tablet Take 1 tablet (500 mg total) by mouth daily. Patient not taking:  Reported on 11/14/2018 09/04/18   Allyne Gee, MD  magnesium gluconate (MAGONATE) 500 MG tablet Take 500 mg by mouth daily.    [provider]  metoprolol tartrate 75 MG TABS Take 75 mg by mouth 2 (two) times daily. Patient taking differently: Take 50 mg by mouth 2 (two) times daily.  05/15/17   Hillary Bow, MD  oxyCODONE (ROXICODONE) 5 MG/5ML solution Take by mouth. 08/29/17  [provider]  oxyCODONE-acetaminophen (ROXICET) 5-325 MG tablet Take 1 tablet by mouth every 6 (six) hours as needed. Patient not taking: Reported on 11/14/2018 09/22/17   Harvest Dark, MD      PHYSICAL EXAMINATION:   VITAL SIGNS: Blood pressure (!) 164/61, pulse 85, temperature 98.4 F (36.9 C), temperature source Oral, resp. rate (!) 25, height _0  (1.676 m), weight 89.4 kg, SpO2 96 %.  GENERAL:  77 y.o.-year-old patient lying in the bed with no acute distress.  EYES: Pupils equal, round, reactive to light and accommodation. No scleral icterus. Extraocular muscles intact.  HEENT: Head atraumatic, normocephalic. Oropharynx and nasopharynx clear.  NECK:  Supple, no jugular venous distention. No thyroid enlargement, no tenderness.  LUNGS: Normal breath sounds bilaterally, no wheezing, rales,rhonchi or crepitation. No use of accessory muscles of respiration.  CARDIOVASCULAR: S1, S2 normal. No S3/S4.  ABDOMEN: Soft, nontender, nondistended. Bowel sounds present. No organomegaly or mass.  EXTREMITIES: No pedal edema, cyanosis, or clubbing.  NEUROLOGIC: Cranial nerves II through XII are intact. Muscle strength 5/5 in all extremities. Sensation intact.   PSYCHIATRIC: The patient is alert and oriented x 3.  SKIN: No obvious rash, lesion, or ulcer.   LABORATORY PANEL:   CBC Recent Labs  Lab 11/14/18 2139  WBC 12.4*  HGB 9.3*  HCT 30.1*  PLT 350  MCV 85.0  MCH 26.3  MCHC 30.9  RDW 15.4    ------------------------------------------------------------------------------------------------------------------  Chemistries  Recent Labs  Lab 11/14/18 2139  NA 134*  K 5.4*  CL 105  CO2 18*  GLUCOSE 251*  BUN 44*  CREATININE 1.96*  CALCIUM 9.0   ------------------------------------------------------------------------------------------------------------------ estimated creatinine clearance is 32.5 mL/min (A) (by C-G formula based on SCr of 1.96 mg/dL (H)). ------------------------------------------------------------------------------------------------------------------ No results for input(s): TSH, T4TOTAL, T3FREE, THYROIDAB in the last 72 hours.  Invalid input(s): FREET3   Coagulation profile Recent Labs  Lab 11/14/18 2139  INR 0.98   ------------------------------------------------------------------------------------------------------------------- No results for input(s): DDIMER in the last 72 hours. -------------------------------------------------------------------------------------------------------------------  Cardiac Enzymes Recent Labs  Lab 11/14/18 2139  TROPONINI 0.05*   ------------------------------------------------------------------------------------------------------------------ Invalid input(s): POCBNP  ---------------------------------------------------------------------------------------------------------------  Urinalysis    Component Value Date/Time   COLORURINE YELLOW (A) 05/06/2017 1918   APPEARANCEUR CLEAR (A) 05/06/2017 1918   LABSPEC 1.012 05/06/2017 1918   PHURINE 5.0 05/06/2017 1918   GLUCOSEU NEGATIVE 05/06/2017 1918   HGBUR MODERATE (A) 05/06/2017 1918   BILIRUBINUR NEGATIVE 05/06/2017 1918   KETONESUR 5 (A) 05/06/2017 1918   PROTEINUR 100 (A) 05/06/2017 1918   NITRITE NEGATIVE 05/06/2017 1918   LEUKOCYTESUR NEGATIVE 05/06/2017 1918     RADIOLOGY: Dg Chest Portable 1 View  Result Date: 11/14/2018 CLINICAL DATA:   Central chest and left arm pain. EXAM: PORTABLE CHEST 1 VIEW COMPARISON:  09/04/2018 FINDINGS: The heart size and mediastinal contours are within normal limits. Both lungs are clear. The visualized skeletal structures are unremarkable. IMPRESSION: No active disease. Electronically Signed   By: Ashley Royalty M.D.   On: 11/14/2018 22:23    EKG: Orders placed or performed during the hospital encounter of 11/14/18  . ED EKG within 10 minutes  . ED EKG within 10 minutes  . EKG 12-Lead  . EKG 12-Lead  . ED EKG  . ED EKG  . EKG 12-Lead  . EKG 12-Lead    IMPRESSION AND PLAN:  1.  NSTEMI.  Patient is currently chest pain-free.  Continue Plavix, patient is allergic to aspirin.  Continue to monitor patient on telemetry and follow troponin  levels.  Will check 2D echo to further evaluate cardiac function.  Cardiology is consulted for further input. 2.  Acute CHF exacerbation, improving on Lasix IV.  Will check 2D echo for further evaluation of cardiac function. 3.  Hypertensive emergency.  Blood pressure is improving with Lasix and nitro.  Will resume BP home medications.  Continue to monitor blood pressure closely. 4.  Coronary artery disease, status post stents placement in the past. 5.  Diabetes type 2, complicated with chronic renal failure and peripheral neuropathy.  Will monitor blood sugars before meals and at bedtime and use insulin treatment during the hospital stay. 6.  CKD 3.  Creatinine level is stable, at baseline.  We will continue to monitor kidney function closely during diuresis.  Avoid nephrotoxic medications.  All the records are reviewed and case discussed with ED provider. Management plans discussed with the patient, family and they are in agreement.  CODE STATUS: Full Code Status History    Date Active Date Inactive Code Status Order ID Comments User Context   05/07/2017 0047 05/15/2017 1850 Full Code [id number]  Harvie Bridge, DO Inpatient   10/08/2015 1648 10/09/2015 1659  Full Code [id number]  Vaughan Basta, MD Inpatient       TOTAL TIME TAKING CARE OF THIS PATIENT: 50 minutes.    Amelia Jo M.D on 11/15/2018 at 12:11 AM  Between 7am to 6pm - Pager - [phone]  After 6pm go to [email or web address] - password [email or web address] [facility] [place] [facility]  [phone]  CC: Primary care physician; Cletis Athens, MD

## 2018-11-15 NOTE — Consult Note (Signed)
ANTICOAGULATION CONSULT NOTE - Initial Consult  Pharmacy Consult for heparin drip Indication: chest pain/ACS  Allergies  Allergen Reactions  . Gabapentin     Other reaction(s): Other (See Comments) Tremors  . Peanut-Containing Drug Products Anaphylaxis  . Penicillins Hives and Rash    Has patient had a PCN reaction causing immediate rash, facial/tongue/throat swelling, SOB or lightheadedness with hypotension: Yes Has patient had a PCN reaction causing severe rash involving mucus membranes or skin necrosis: No Has patient had a PCN reaction that required hospitalization No Has patient had a PCN reaction occurring within the last 10 years: No If all of the above answers are "NO", then may proceed with Cephalosporin use.  . Bee Venom Swelling  . Influenza Vaccines Hives  . Inh [Isoniazid] Hives  . Kenalog [Triamcinolone Acetonide] Hives  . Levaquin [Levofloxacin] Other (See Comments)    Tendon, ligament pain.   . Nalfon [Fenoprofen Calcium] Hives  . Naproxen   . Peanut Oil   . Nsaids Rash    Nalfon 600 Nalfon 600    Patient Measurements: Height: 5\' 6"  (167.6 cm) Weight: 197 lb 6.4 oz (89.5 kg) IBW/kg (Calculated) : 63.8 Heparin Dosing Weight: 82.6kg  Vital Signs: Temp: 98.5 F (36.9 C) (12/07 1958) Temp Source: Oral (12/07 1958) BP: 126/53 (12/07 1958) Pulse Rate: 64 (12/07 1958)  Labs: Recent Labs    11/14/18 2139 11/15/18 0406 11/15/18 1313 11/15/18 1833 11/15/18 1904  HGB 9.3* 8.6*  --   --   --   HCT 30.1* 28.9*  --   --   --   PLT 350 335  --   --   --   APTT 33  --   --   --   --   LABPROT 12.9  --   --   --   --   INR 0.98  --   --   --   --   HEPARINUNFRC  --   --   --   --  0.47  CREATININE 1.96* 2.00*  --   --   --   TROPONINI 0.05* 3.65* 6.06* 5.11*  --     Estimated Creatinine Clearance: 31.9 mL/min (A) (by C-G formula based on SCr of 2 mg/dL (H)).   Medical History: Past Medical History:  Diagnosis Date  . Arthritis   . 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 Barrett esophagus   . Cancer [facility]) 2002   prostate  . Colon polyp   . Diabetes mellitus without complication ([place])   . Diverticulosis   . Gout   . Heart disease   . Hemangioma    liver  . Hyperlipidemia   . Hypertension   . Myocardial infarct ([place])   . Ocular hypertension   . Peripheral vascular disease ([place])   . Skin cancer   . Sleep apnea   . Vitreoretinal degeneration     Medications:  Scheduled:  . allopurinol  200 mg Oral BID  . amLODipine  5 mg Oral Daily  . citalopram  10 mg Oral Daily  . clopidogrel  75 mg Oral Daily  . docusate sodium  100 mg Oral BID  . fluticasone  2 spray Each Nare Daily  . fluticasone furoate-vilanterol  1 puff Inhalation Daily  . [START ON 11/16/2018] furosemide  40 mg Intravenous Daily  . insulin aspart  0-5 Units Subcutaneous QHS  . insulin aspart  0-9 Units Subcutaneous TID WC  . [START ON 11/16/2018] insulin glargine  40 Units Subcutaneous QHS  . isosorbide mononitrate  120 mg Oral Daily  . loratadine  10 mg Oral Daily  . losartan  25 mg Oral Daily  . magnesium oxide  400 mg Oral Daily  . methocarbamol  500 mg Oral BID  . metoprolol tartrate  50 mg Oral BID  . pantoprazole  40 mg Oral BID  . rosuvastatin  40 mg Oral Daily  . Vitamin D (Ergocalciferol)  50,000 Units Oral Q30 days    Assessment: Patient is a 77 year old male who presents with chest pain, found to have elevated troponin. Pharmacy consulted to dose heparin drip. Last subq heparin injection at 0100, no anticoag PTA. Baseline labs ordered last night.   Patient is currently therapeutic with HL = 0.47  Goal of Therapy:  Heparin level 0.3-0.7 units/ml Monitor platelets by anticoagulation protocol: Yes   Plan:  Continue heparin infusion at 1000 units/hr Check anti-Xa level with next am labs Continue to monitor H&H and platelets   Lu Duffel, PharmD, BCPS Clinical Pharmacist 11/15/2018 9:38 PM  11/15/2018,9:36 PM

## 2018-11-15 NOTE — Progress Notes (Signed)
[facility]  ROUNDING NOTE   Subjective:  Patient well-known to [place]. Baseline EGFR 32 from November 03, 2018. Followed closely in our office. Admitted with chest pain and shortness of breath.    Objective:  Vital signs in last 24 hours:  Temp:  [97.8 F (36.6 C)-98.6 F (37 C)] 97.8 F (36.6 C) (12/07 0529) Pulse Rate:  [68-94] 75 (12/07 0943) Resp:  [10-26] 17 (12/07 0529) BP: (135-192)/(50-79) 166/61 (12/07 0943) SpO2:  [93 %-100 %] 98 % (12/07 0943) Weight:  [89.4 kg-89.5 kg] 89.5 kg (12/07 0300)  Weight change:  Filed Weights   11/14/18 2132 11/15/18 0300  Weight: 89.4 kg 89.5 kg    Intake/Output: I/O last 3 completed shifts: In: -  Out: 1600 [Urine:1600]   Intake/Output this shift:  Total I/O In: 240 [P.O.:240] Out: 701 [Urine:700; Stool:1]  Physical Exam: General: No acute distress  Head: Normocephalic, atraumatic. Moist oral mucosal membranes  Eyes: Anicteric  Neck: Supple, trachea midline  Lungs:  Clear to auscultation, normal effort  Heart: S1S2 no rubs  Abdomen:  Soft, nontender, bowel sounds present  Extremities: Trace peripheral edema.  Neurologic: Awake, alert, following commands  Skin: No lesions  Access:     Basic Metabolic Panel: Recent Labs  Lab 11/14/18 2139 11/15/18 0406  NA 134* 134*  K 5.4* 5.5*  CL 105 106  CO2 18* 20*  GLUCOSE 251* 267*  BUN 44* 46*  CREATININE 1.96* 2.00*  CALCIUM 9.0 8.8*    Liver Function Tests: No results for input(s): AST, ALT, ALKPHOS, BILITOT, PROT, ALBUMIN in the last 168 hours. No results for input(s): LIPASE, AMYLASE in the last 168 hours. No results for input(s): AMMONIA in the last 168 hours.  CBC: Recent Labs  Lab 11/14/18 2139 11/15/18 0406  WBC 12.4* 11.3*  HGB 9.3* 8.6*  HCT 30.1* 28.9*  MCV 85.0 86.3  PLT 350 335    Cardiac Enzymes: Recent Labs  Lab 11/14/18 2139 11/15/18 0406 11/15/18 1313  TROPONINI 0.05* 3.65* 6.06*    BNP: Invalid input(s):  POCBNP  CBG: Recent Labs  Lab 11/15/18 0050 11/15/18 0819 11/15/18 1300  GLUCAP 246* 233* 210*    Microbiology: Results for orders placed or performed during the hospital encounter of 05/06/17  Blood Culture (routine x 2)     Status: None   Collection Time: 05/06/17  7:02 PM  Result Value Ref Range Status   Specimen Description BLOOD RIGHT HAND  Final   Special Requests   Final    BOTTLES DRAWN AEROBIC AND ANAEROBIC Blood Culture adequate volume   Culture NO GROWTH 5 DAYS  Final   Report Status 05/11/2017 FINAL  Final  Blood Culture (routine x 2)     Status: None   Collection Time: 05/06/17  7:02 PM  Result Value Ref Range Status   Specimen Description BLOOD LEFT AC  Final   Special Requests   Final    BOTTLES DRAWN AEROBIC AND ANAEROBIC Blood Culture adequate volume   Culture NO GROWTH 5 DAYS  Final   Report Status 05/11/2017 FINAL  Final  Urine culture     Status: None   Collection Time: 05/06/17  7:18 PM  Result Value Ref Range Status   Specimen Description URINE, RANDOM  Final   Special Requests NONE  Final   Culture   Final    NO GROWTH Performed at [facility], [place] [address], [place], [place] [address]    Report Status 05/08/2017 FINAL  Final  Gastrointestinal Panel by PCR , Stool     Status: Abnormal   Collection Time: 05/07/17  4:44 AM  Result Value Ref Range Status   Campylobacter species NOT DETECTED NOT DETECTED Final   Plesimonas shigelloides NOT DETECTED NOT DETECTED Final   Salmonella species DETECTED (A) NOT DETECTED Final    Comment: RESULT CALLED TO, READ BACK BY AND VERIFIED WITH: BETH BUONO 05/07/17 0642 ALV    Yersinia enterocolitica NOT DETECTED NOT DETECTED Final   Vibrio species NOT DETECTED NOT DETECTED Final   Vibrio cholerae NOT DETECTED NOT DETECTED Final   Enteroaggregative E coli (EAEC) NOT DETECTED NOT DETECTED Final   Enteropathogenic E coli (EPEC) NOT DETECTED NOT DETECTED Final   Enterotoxigenic E coli (ETEC) NOT DETECTED  NOT DETECTED Final   Shiga like toxin producing E coli (STEC) NOT DETECTED NOT DETECTED Final   E. coli O157 NOT DETECTED NOT DETECTED Final   Shigella/Enteroinvasive E coli (EIEC) NOT DETECTED NOT DETECTED Final   Cryptosporidium NOT DETECTED NOT DETECTED Final   Cyclospora cayetanensis NOT DETECTED NOT DETECTED Final   Entamoeba histolytica NOT DETECTED NOT DETECTED Final   Giardia lamblia NOT DETECTED NOT DETECTED Final   Adenovirus F40/41 NOT DETECTED NOT DETECTED Final   Astrovirus NOT DETECTED NOT DETECTED Final   Norovirus GI/GII NOT DETECTED NOT DETECTED Final   Rotavirus A NOT DETECTED NOT DETECTED Final   Sapovirus (I, II, IV, and V) NOT DETECTED NOT DETECTED Final  C difficile quick scan w PCR reflex     Status: None   Collection Time: 05/07/17  4:44 AM  Result Value Ref Range Status   C Diff antigen NEGATIVE NEGATIVE Final   C Diff toxin NEGATIVE NEGATIVE Final   C Diff interpretation No C. difficile detected.  Final  MRSA PCR Screening     Status: None   Collection Time: 05/07/17  6:24 AM  Result Value Ref Range Status   MRSA by PCR NEGATIVE NEGATIVE Final    Comment:        The GeneXpert MRSA Assay (FDA approved for NASAL specimens only), is one component of a comprehensive MRSA colonization surveillance program. It is not intended to diagnose MRSA infection nor to guide or monitor treatment for MRSA infections.     Coagulation Studies: Recent Labs    11/14/18 Feb 13, 2138  LABPROT 12.9  INR 0.98    Urinalysis: No results for input(s): COLORURINE, LABSPEC, PHURINE, GLUCOSEU, HGBUR, BILIRUBINUR, KETONESUR, PROTEINUR, UROBILINOGEN, NITRITE, LEUKOCYTESUR in the last 72 hours.  Invalid input(s): APPERANCEUR    Imaging: Dg Chest Portable 1 View  Result Date: 11/14/2018 CLINICAL DATA:  Central chest and left arm pain. EXAM: PORTABLE CHEST 1 VIEW COMPARISON:  09/04/2018 FINDINGS: The heart size and mediastinal contours are within normal limits. Both lungs are  clear. The visualized skeletal structures are unremarkable. IMPRESSION: No active disease. Electronically Signed   By: Ashley Royalty M.D.   On: 11/14/2018 22:23     Medications:   . heparin 1,000 Units/hr (11/15/18 1043)   . allopurinol  200 mg Oral BID  . amLODipine  5 mg Oral Daily  . citalopram  10 mg Oral Daily  . clopidogrel  75 mg Oral Daily  . docusate sodium  100 mg Oral BID  . fluticasone  2 spray Each Nare Daily  . fluticasone furoate-vilanterol  1 puff Inhalation Daily  . [START ON 11/16/2018] furosemide  40 mg Intravenous Daily  . insulin aspart  0-5 Units Subcutaneous QHS  . insulin aspart  0-9 Units Subcutaneous TID WC  . [START ON 11/16/2018] insulin glargine  40 Units Subcutaneous QHS  . isosorbide mononitrate  120 mg Oral Daily  . loratadine  10 mg Oral Daily  . losartan  25 mg Oral Daily  . magnesium oxide  400 mg Oral Daily  . methocarbamol  500 mg Oral BID  . metoprolol tartrate  50 mg Oral BID  . pantoprazole  40 mg Oral BID  . rosuvastatin  40 mg Oral Daily  . Vitamin D (Ergocalciferol)  50,000 Units Oral Q30 days   acetaminophen **OR** acetaminophen, bisacodyl, HYDROcodone-acetaminophen, ipratropium-albuterol, nitroGLYCERIN, ondansetron **OR** ondansetron (ZOFRAN) IV, traMADol, traZODone  Assessment/ Plan:  77 y.o. male with past medical history of chronic kidney disease stage III, prostate cancer, diabetes mellitus type 2, diverticulosis, gout, coronary artery disease with 75% RCA lesion, hyperlipidemia, hypertension, peripheral vascular disease, skin cancer, obstructive sleep apnea was admitted with chest pain and shortness of breath and found to have NSTEMI.  1.  Chronic kidney disease stage III.  His EGFR appears to be at its baseline.  Continue the patient on losartan for now.  No immediate plans for cardiac catheterization.  2.  Hypertension.  Continue amlodipine, losartan, and metoprolol.  3.  Anemia chronic kidney disease.  Hemoglobin noted to be low  at 8.6.  Continue to monitor as an outpatient.  He may end up needing Epogen as an outpatient.  4.  Gout.  Continue allopurinol 200 mg p.o. twice daily and monitor serum uric acid periodically.   LOS: 1 Maeven Mcdougall 12/7/20192:23 PM

## 2018-11-15 NOTE — Progress Notes (Signed)
Patient started on IV heparin, secind IV placed.  Bed alarm for safety due to starting of the blood thinner.  Patient agreeable and will call if needs to get oob.  Call bell in reach.

## 2018-11-15 NOTE — Consult Note (Signed)
[facility] Cardiology  CARDIOLOGY CONSULT NOTE  Patient ID: Mike Decker MRN: [id number] DOB/AGE: July 18, 1941 77 y.o.  Admit date: 11/14/2018 Referring Physician Mody Primary Physician Masoud Primary Cardiologist [facility] Reason for Consultation non-ST elevation myocardial infarction  HPI: The 56-year-old gentleman referred for evaluation of chest pain and non-ST elevation myocardial infarction.  The patient was in his usual state of health the past 1 to 2 weeks when he noted increasing shortness of breath, saw his cardiologist Dr. Clayborn Bigness, and is scheduled for 2D echocardiogram and stress MRI next week.  He presents to [facility] with substernal chest tightness pain to his left arm.  ECG was nondiagnostic.  Initial troponin is 0.05.  Follow-up troponin 3.65.  The patient was hospitalized 05/07/2017 for chest pain.  2D echocardiogram revealed normal left ventricular function, with LVEF 55 to 60%.  Initial troponin was 0.34, follow-up troponins were 5.27 15.97 and 21.51.  The patient underwent cardiac catheterization 05/09/2017 which revealed patent stent RCA with 75% stenosis ostium left circumflex.  The patient has chronic kidney disease, current BUN and creatinine were 44 1.96.  The patient was told by Dr. Clayborn Bigness, if he required a repeat cardiac catheterization that he would likely set this up at [facility] due to his anatomy and chronic kidney disease.   Review of systems complete and found to be negative unless listed above     Past Medical History:  Diagnosis Date  . Arthritis   . Atrioventricular canal (AVC)    irregular heart beats  . Barrett esophagus   . Cancer [facility]) 2002   prostate  . Colon polyp   . Diabetes mellitus without complication ([place])   . Diverticulosis   . Gout   . Heart disease   . Hemangioma    liver  . Hyperlipidemia   . Hypertension   . Myocardial infarct ([place])   . Ocular hypertension   . Peripheral vascular disease ([place])   . Skin cancer   . Sleep apnea   .  Vitreoretinal degeneration     Past Surgical History:  Procedure Laterality Date  . CATARACT EXTRACTION  2011, 2012  . CORONARY ANGIOPLASTY WITH STENT PLACEMENT  2012  . ESOPHAGOGASTRODUODENOSCOPY (EGD) WITH PROPOFOL N/A 06/01/2016   Procedure: ESOPHAGOGASTRODUODENOSCOPY (EGD) WITH PROPOFOL;  Surgeon: Manya Silvas, MD;  Location: [facility] ENDOSCOPY;  Service: Endoscopy;  Laterality: N/A;  . HERNIA REPAIR     x2  . INTRAOCULAR LENS INSERTION    . LEFT HEART CATH AND CORONARY ANGIOGRAPHY N/A 05/09/2017   Procedure: Left Heart Cath and Coronary Angiography;  Surgeon: Yolonda Kida, MD;  Location: [facility] CV LAB;  Service: Cardiovascular;  Laterality: N/A;  . NOSE SURGERY     submucous resection  . PROSTATE SURGERY  2002  . ROTATOR CUFF REPAIR Right     Medications Prior to Admission  Medication Sig Dispense Refill Last Dose  . acetaminophen (TYLENOL) 500 MG tablet Take 1,000 mg by mouth every 6 (six) hours as needed for mild pain, moderate pain or headache.   prn at prn  . allopurinol (ZYLOPRIM) 100 MG tablet Take 200 mg by mouth 2 (two) times daily.    Unknown at Unknown  . amLODipine (NORVASC) 5 MG tablet TAKE 1 TABLET (5 MG TOTAL) BY MOUTH 2 (TWO) TIMES DAILY.  5 Unknown at Unknown  . aspirin (ASPIRIN CHILDRENS) 81 MG chewable tablet Chew 1 tablet (81 mg total) by mouth daily. 120 tablet 0 Unknown at Unknown  . budesonide-formoterol (SYMBICORT) 160-4.5 MCG/ACT inhaler Inhale  2 puffs into the lungs 2 (two) times daily.   Unknown at Unknown  . cetirizine (ZYRTEC) 5 MG tablet Take 5 mg by mouth daily as needed for allergies or rhinitis.    prn at prn  . Cholecalciferol (VITAMIN D3) 50000 UNITS CAPS Take 50,000 Units by mouth every 30 (thirty) days.   Unknown at Unknown  . citalopram (CELEXA) 10 MG tablet Take 10 mg by mouth daily.   Unknown at Unknown  . clopidogrel (PLAVIX) 75 MG tablet Take 1 tablet (75 mg total) by mouth daily. 30 tablet 0 Unknown at Unknown  . colchicine 0.6 MG  tablet Take 0.6 mg by mouth 3 (three) times daily.   Unknown at Unknown  . dextromethorphan-guaiFENesin (MUCINEX DM) 30-600 MG 12hr tablet Take 1 tablet by mouth 2 (two) times daily as needed for cough.   prn at prn  . EPINEPHrine (EPI-PEN) 0.3 mg/0.3 mL SOAJ injection Inject into the muscle as needed (anaphylaxis).    prn at prn  . fluticasone (FLONASE) 50 MCG/ACT nasal spray Place 2 sprays into both nostrils daily.   3 Unknown at Unknown  . furosemide (LASIX) 20 MG tablet Take 2 tablets (40 mg total) by mouth 2 (two) times daily. (Patient taking differently: Take 40 mg by mouth daily. ) 60 tablet 0 Unknown at Unknown  . insulin aspart (NOVOLOG FLEXPEN) 100 UNIT/ML FlexPen Inject 20-32 Units into the skin See admin instructions. Inject 14 units every morning with breakfast, 24 units at lunch and 24 units at dinner, follow sliding scale for the rest of meals.   Unknown at Unknown  . insulin glargine (LANTUS) 100 UNIT/ML injection Inject 62 Units into the skin at bedtime.    Unknown at Unknown  . isosorbide mononitrate (IMDUR) 120 MG 24 hr tablet Take 1 tablet (120 mg total) by mouth daily. 30 tablet 0 Unknown at Unknown  . losartan (COZAAR) 25 MG tablet Take 25 mg by mouth daily.  5 Unknown at Unknown  . magnesium oxide (MAG-OX) 400 MG tablet Take 400 mg by mouth daily.   Unknown at Unknown  . methocarbamol (ROBAXIN) 500 MG tablet Take 500 mg by mouth 2 (two) times daily.   0 Unknown at Unknown  . methylPREDNISolone (MEDROL DOSEPAK) 4 MG TBPK tablet TAKE 6 TABLETS BY MOUTH DAY 1, THEN DECREASE BY 1 TABLET EACH DAY UNTIL GONE (6,5,4,3,2,1)  0 Unknown at Unknown  . nitroGLYCERIN (NITROSTAT) 0.4 MG SL tablet Place 0.4 mg under the tongue every 5 (five) minutes x 3 doses as needed for chest pain.    prn at prn  . Omega-3 Fatty Acids (FISH OIL) 1000 MG CAPS Take 1 capsule by mouth daily.    Unknown at Unknown  . pantoprazole (PROTONIX) 40 MG tablet Take 40 mg by mouth 2 (two) times daily.   Unknown at  Unknown  . polyethylene glycol powder (GLYCOLAX/MIRALAX) powder Use as directed   Unknown at Unknown  . rosuvastatin (CRESTOR) 40 MG tablet Take 40 mg by mouth daily.   Unknown at Unknown  . traMADol (ULTRAM) 50 MG tablet Take 50 mg by mouth every 4 (four) hours as needed.   prn at prn  . Vitamin D, Ergocalciferol, (DRISDOL) 50000 units CAPS capsule TAKE 1 CAPSULE BY MOUTH ONCE A MONTH  1 Unknown at Unknown  . CARAFATE 1 GM/10ML suspension TAKE 2 TEASPOONSFUL (10MLS) BY MOUTH 4 TIMES A DAY BEFORE MEALS AND NIGHTLY FOR 14 DAYS  2 Completed Course at Unknown time  . chlorpheniramine-HYDROcodone ([place])  10-8 MG/5ML SUER TAKE 1/2 TEASPOONFUL BY MOUTH TWICE A DAY  0 Completed Course at Unknown time  . Dimethyl Ether (COMPOUND W COMPLETE) KIT Take 15 mL three times daily before meals for the first 2 days following ablation treatment and then every 4 hours ONLY as needed thereafter.   Completed Course at Unknown time  . gabapentin (NEURONTIN) 100 MG capsule TAKE 1 CAPSULE THREE TIMES DAILY FOR 1 WEEK, THEN INCREASE TO 2 CAPSULES THREE TIMES DAILY  3 Not Taking at Unknown time  . ipratropium-albuterol (DUONEB) 0.5-2.5 (3) MG/3ML SOLN Take 3 mLs by nebulization every 4 (four) hours as needed. (Patient not taking: Reported on 11/14/2018) 360 mL 0 Not Taking at Unknown time  . levofloxacin (LEVAQUIN) 500 MG tablet Take 1 tablet (500 mg total) by mouth daily. (Patient not taking: Reported on 11/14/2018) 7 tablet 0 Completed Course at Unknown time  . magnesium gluconate (MAGONATE) 500 MG tablet Take 500 mg by mouth daily.   Not Taking at Unknown time  . metoprolol tartrate 75 MG TABS Take 75 mg by mouth 2 (two) times daily. (Patient taking differently: Take 50 mg by mouth 2 (two) times daily. ) 60 tablet 0 Unknown at Unknown  . oxyCODONE (ROXICODONE) 5 MG/5ML solution Take by mouth.   Completed Course at Unknown time  . oxyCODONE-acetaminophen (ROXICET) 5-325 MG tablet Take 1 tablet by mouth every 6 (six) hours  as needed. (Patient not taking: Reported on 11/14/2018) 20 tablet 0 Completed Course at Unknown time   Social History   Socioeconomic History  . Marital status: Married    Spouse name: Not on file  . Number of children: Not on file  . Years of education: Not on file  . Highest education level: Not on file  Occupational History  . Not on file  Social Needs  . Financial resource strain: Not on file  . Food insecurity:    Worry: Not on file    Inability: Not on file  . Transportation needs:    Medical: Not on file    Non-medical: Not on file  Tobacco Use  . Smoking status: Former Smoker    Years: 20.00    Types: Cigarettes    Last attempt to quit: 12/10/1976    Years since quitting: 41.9  . Smokeless tobacco: Never Used  Substance and Sexual Activity  . Alcohol use: No  . Drug use: No  . Sexual activity: Not on file  Lifestyle  . Physical activity:    Days per week: Not on file    Minutes per session: Not on file  . Stress: Not on file  Relationships  . Social connections:    Talks on phone: Not on file    Gets together: Not on file    Attends religious service: Not on file    Active member of club or organization: Not on file    Attends meetings of clubs or organizations: Not on file    Relationship status: Not on file  . Intimate partner violence:    Fear of current or ex partner: Not on file    Emotionally abused: Not on file    Physically abused: Not on file    Forced sexual activity: Not on file  Other Topics Concern  . Not on file  Social History Narrative  . Not on file    Family History  Problem Relation Age of Onset  . Stroke Maternal Grandfather       Review of systems complete  and found to be negative unless listed above      PHYSICAL EXAM  General: Well developed, well nourished, in no acute distress HEENT:  Normocephalic and atramatic Neck:  No JVD.  Lungs: Clear bilaterally to auscultation and percussion. Heart: HRRR . Normal S1 and S2  without gallops or murmurs.  Abdomen: Bowel sounds are positive, abdomen soft and non-tender  Msk:  Back normal, normal gait. Normal strength and tone for age. Extremities: No clubbing, cyanosis or edema.   Neuro: Alert and oriented X 3. Psych:  Good affect, responds appropriately  Labs:   Lab Results  Component Value Date   WBC 11.3 (H) 11/15/2018   HGB 8.6 (L) 11/15/2018   HCT 28.9 (L) 11/15/2018   MCV 86.3 11/15/2018   PLT 335 11/15/2018    Recent Labs  Lab 11/15/18 0406  NA 134*  K 5.5*  CL 106  CO2 20*  BUN 46*  CREATININE 2.00*  CALCIUM 8.8*  GLUCOSE 267*   Lab Results  Component Value Date   TROPONINI 3.65 (HH) 11/15/2018    Lab Results  Component Value Date   CHOL 92 05/07/2017   CHOL 189 10/08/2015   Lab Results  Component Value Date   HDL 23 (L) 05/07/2017   HDL 30 (L) 10/08/2015   Lab Results  Component Value Date   LDLCALC 35 05/07/2017   LDLCALC UNABLE TO CALCULATE IF TRIGLYCERIDE OVER 400 mg/dL 10/08/2015   Lab Results  Component Value Date   TRIG 168 (H) 05/07/2017   TRIG 610 (H) 10/08/2015   Lab Results  Component Value Date   CHOLHDL 4.0 05/07/2017   CHOLHDL 6.3 10/08/2015   No results found for: LDLDIRECT    Radiology: Dg Chest Portable 1 View  Result Date: 11/14/2018 CLINICAL DATA:  Central chest and left arm pain. EXAM: PORTABLE CHEST 1 VIEW COMPARISON:  09/04/2018 FINDINGS: The heart size and mediastinal contours are within normal limits. Both lungs are clear. The visualized skeletal structures are unremarkable. IMPRESSION: No active disease. Electronically Signed   By: Ashley Royalty M.D.   On: 11/14/2018 22:23    EKG: Normal sinus rhythm, no acute ischemic ST-T wave changes  ASSESSMENT AND PLAN:   1.  Chest pain, elevated troponin, consistent with non-ST elevation myocardial infarction.  The patient had very similar hospitalization 05/09/2017 at which time cardiac catheterization revealed patent stent RCA with 75% stenosis ostium  left circumflex not felt to be amenable to PCI.  Patient is scheduled for stress MRI next week [facility]. 2.  Chronic kidney disease, BUN/creatinine 44 and 1.96 which is baseline  3.  Essential hypertension  Recommendations  1.  Agree with current therapy 2.  Cancel Lexiscan Myoview 3.  Cycle troponins 4.  Pending patient's initial clinical course, and repeat troponin, continue current treatment plan with stress MRI at [facility] next week versus in hospital transfer to [facility] for cardiac catheterization  Signed: Isaias Cowman MD,PhD, [facility] 11/15/2018, 8:40 AM

## 2018-11-15 NOTE — Plan of Care (Signed)
Patient admitted at 42. Patient profile completed. No complaints of pain. Patient is NPO for lexiscan.

## 2018-11-15 NOTE — Consult Note (Signed)
ANTICOAGULATION CONSULT NOTE - Initial Consult  Pharmacy Consult for heparin drip Indication: chest pain/ACS  Allergies  Allergen Reactions  . Gabapentin     Other reaction(s): Other (See Comments) Tremors  . Peanut-Containing Drug Products Anaphylaxis  . Penicillins Hives and Rash    Has patient had a PCN reaction causing immediate rash, facial/tongue/throat swelling, SOB or lightheadedness with hypotension: Yes Has patient had a PCN reaction causing severe rash involving mucus membranes or skin necrosis: No Has patient had a PCN reaction that required hospitalization No Has patient had a PCN reaction occurring within the last 10 years: No If all of the above answers are "NO", then may proceed with Cephalosporin use.  . Bee Venom Swelling  . Influenza Vaccines Hives  . Inh [Isoniazid] Hives  . Kenalog [Triamcinolone Acetonide] Hives  . Levaquin [Levofloxacin] Other (See Comments)    Tendon, ligament pain.   . Nalfon [Fenoprofen Calcium] Hives  . Naproxen   . Peanut Oil   . Nsaids Rash    Nalfon 600 Nalfon 600    Patient Measurements: Height: 5\' 6"  (167.6 cm) Weight: 197 lb 6.4 oz (89.5 kg) IBW/kg (Calculated) : 63.8 Heparin Dosing Weight: 82.6kg  Vital Signs: Temp: 97.8 F (36.6 C) (12/07 0529) Temp Source: Oral (12/07 0529) BP: 135/65 (12/07 0529) Pulse Rate: 68 (12/07 0529)  Labs: Recent Labs    11/14/18 2139 11/15/18 0406  HGB 9.3* 8.6*  HCT 30.1* 28.9*  PLT 350 335  APTT 33  --   LABPROT 12.9  --   INR 0.98  --   CREATININE 1.96* 2.00*  TROPONINI 0.05* 3.65*    Estimated Creatinine Clearance: 31.9 mL/min (A) (by C-G formula based on SCr of 2 mg/dL (H)).   Medical History: Past Medical History:  Diagnosis Date  . Arthritis   . Atrioventricular canal (AVC)    irregular heart beats  . Barrett esophagus   . Cancer [facility]) 2002   prostate  . Colon polyp   . Diabetes mellitus without complication ([place])   . Diverticulosis   . Gout   . Heart  disease   . Hemangioma    liver  . Hyperlipidemia   . Hypertension   . Myocardial infarct ([place])   . Ocular hypertension   . Peripheral vascular disease ([place])   . Skin cancer   . Sleep apnea   . Vitreoretinal degeneration     Medications:  Scheduled:  . allopurinol  200 mg Oral BID  . amLODipine  5 mg Oral Daily  . citalopram  10 mg Oral Daily  . clopidogrel  75 mg Oral Daily  . dextrose  25 mL Intravenous Once  . docusate sodium  100 mg Oral BID  . fluticasone  2 spray Each Nare Daily  . fluticasone furoate-vilanterol  1 puff Inhalation Daily  . furosemide  40 mg Intravenous BID  . gabapentin  100 mg Oral TID  . heparin  4,000 Units Intravenous Once  . insulin aspart  0-5 Units Subcutaneous QHS  . insulin aspart  0-9 Units Subcutaneous TID WC  . insulin aspart  10 Units Intravenous Once  . [START ON 11/16/2018] insulin glargine  40 Units Subcutaneous QHS  . isosorbide mononitrate  120 mg Oral Daily  . loratadine  10 mg Oral Daily  . losartan  25 mg Oral Daily  . magnesium oxide  400 mg Oral Daily  . magnesium oxide  400 mg Oral Daily  . methocarbamol  500 mg Oral BID  . metoprolol  tartrate  50 mg Oral BID  . pantoprazole  40 mg Oral BID  . rosuvastatin  40 mg Oral Daily  . sucralfate  1 g Oral BID  . Vitamin D (Ergocalciferol)  50,000 Units Oral Q30 days    Assessment: Patient is a 77 year old male who presents with chest pain, found to have elevated troponin. Pharmacy consulted to dose heparin drip. Last subq heparin injection at 0100, no anticoag PTA. Baseline labs ordered last night.   Goal of Therapy:  Heparin level 0.3-0.7 units/ml Monitor platelets by anticoagulation protocol: Yes   Plan:  Give 4000 units bolus x 1 Start heparin infusion at 1000 units/hr Check anti-Xa level in 8 hours and daily while on heparin Continue to monitor H&H and platelets   Melissa D Maccia, Pharm.D, BCPS Clinical Pharmacist 11/15/2018,8:46 AM

## 2018-11-15 NOTE — Progress Notes (Signed)
Cardiology made aware of patient newest troponin of 6.06.  Cardiology aware that patient is resting in bed without complaints of pain or discomfort at this time. Patient will call RN with any changes. No new orders received.

## 2018-11-15 NOTE — Progress Notes (Signed)
[place] at [place] NAME: Mike Decker    MR#:  [id number]  DATE OF BIRTH:  11-May-1941  SUBJECTIVE:   Patient without SOB or chest pain this am  REVIEW OF SYSTEMS:    Review of Systems  Constitutional: Negative for fever, chills weight loss HENT: Negative for ear pain, nosebleeds, congestion, facial swelling, rhinorrhea, neck pain, neck stiffness and ear discharge.   Respiratory: Negative for cough, shortness of breath, wheezing  Cardiovascular: Negative for chest pain, palpitations and leg swelling.  Gastrointestinal: Negative for heartburn, abdominal pain, vomiting, diarrhea or consitpation Genitourinary: Negative for dysuria, urgency, frequency, hematuria Musculoskeletal: Negative for back pain or joint pain Neurological: Negative for dizziness, seizures, syncope, focal weakness,  numbness and headaches.  Hematological: Does not bruise/bleed easily.  Psychiatric/Behavioral: Negative for hallucinations, confusion, dysphoric mood    Tolerating Diet: yes      DRUG ALLERGIES:   Allergies  Allergen Reactions  . Gabapentin     Other reaction(s): Other (See Comments) Tremors  . Peanut-Containing Drug Products Anaphylaxis  . Penicillins Hives and Rash    Has patient had a PCN reaction causing immediate rash, facial/tongue/throat swelling, SOB or lightheadedness with hypotension: Yes Has patient had a PCN reaction causing severe rash involving mucus membranes or skin necrosis: No Has patient had a PCN reaction that required hospitalization No Has patient had a PCN reaction occurring within the last 10 years: No If all of the above answers are "NO", then may proceed with Cephalosporin use.  . Bee Venom Swelling  . Influenza Vaccines Hives  . Inh [Isoniazid] Hives  . Kenalog [Triamcinolone Acetonide] Hives  . Levaquin [Levofloxacin] Other (See Comments)    Tendon, ligament pain.   . Nalfon [Fenoprofen Calcium] Hives  . Naproxen    . Peanut Oil   . Nsaids Rash    Nalfon 600 Nalfon 600    VITALS:  Blood pressure (!) 166/61, pulse 75, temperature 97.8 F (36.6 C), temperature source Oral, resp. rate 17, height 5\' 6"  (1.676 m), weight 89.5 kg, SpO2 98 %.  PHYSICAL EXAMINATION:  Constitutional: Appears well-developed and well-nourished. No distress. HENT: Normocephalic. Marland Kitchen Oropharynx is clear and moist.  Eyes: Conjunctivae and EOM are normal. PERRLA, no scleral icterus.  Neck: Normal ROM. Neck supple. No JVD. No tracheal deviation. CVS: RRR, S1/S2 +, no murmurs, no gallops, no carotid bruit.  Pulmonary: Effort and breath sounds normal, Bilateral lower lobe crackles no wheezing Abdominal: Soft. BS +,  no distension, tenderness, rebound or guarding.  Musculoskeletal: Normal range of motion. No edema and no tenderness.  Neuro: Alert. CN 2-12 grossly intact. No focal deficits. Skin: Skin is warm and dry. No rash noted. Psychiatric: Normal mood and affect.      LABORATORY PANEL:   CBC Recent Labs  Lab 11/15/18 0406  WBC 11.3*  HGB 8.6*  HCT 28.9*  PLT 335   ------------------------------------------------------------------------------------------------------------------  Chemistries  Recent Labs  Lab 11/15/18 0406  NA 134*  K 5.5*  CL 106  CO2 20*  GLUCOSE 267*  BUN 46*  CREATININE 2.00*  CALCIUM 8.8*   ------------------------------------------------------------------------------------------------------------------  Cardiac Enzymes Recent Labs  Lab 11/14/18 2139 11/15/18 0406  TROPONINI 0.05* 3.65*   ------------------------------------------------------------------------------------------------------------------  RADIOLOGY:  Dg Chest Portable 1 View  Result Date: 11/14/2018 CLINICAL DATA:  Central chest and left arm pain. EXAM: PORTABLE CHEST 1 VIEW COMPARISON:  09/04/2018 FINDINGS: The heart size and mediastinal contours are within normal limits. Both lungs are clear. The  visualized  skeletal structures are unremarkable. IMPRESSION: No active disease. Electronically Signed   By: Ashley Royalty M.D.   On: 11/14/2018 22:23     ASSESSMENT AND PLAN:   77 year old male with history of diabetes, chronic kidney disease stage III prostate cancer who presents to the ER due to shortness of breath and chest pain.  1.  Non-STEMI: Patient has consented for heparin drip which I started this morning. Previous cardiac catheterization revealed patent stent RCA with 75% stenosis ostium left circumflex not felt to be amendable to PCI.  Patient is scheduled for stress MRI next week at [facility]  Neurology consultation appreciated Continue telemetry and troponins Continue heparin drip Follow-up on echo Continue Plavix, isosorbide, losartan, metoprolol, statin  2.  Acute on chronic kidney disease stage III: Nephrology consultation requested via epic  3.  Essential hypertension: Continue metoprolol, losartan, isosorbide  4.  Diabetes: Continue Lantus with sliding scale and ADA diet  5.  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  Hyperkalemia: Treated with dextrose and insulin   Management plans discussed with the patient and he is in agreement.  CODE STATUS: full  TOTAL TIME TAKING CARE OF THIS PATIENT: 30 minutes.     POSSIBLE D/C 2 days, DEPENDING ON CLINICAL CONDITION.   Hallie Ertl M.D on 11/15/2018 at 12:20 PM  Between 7am to 6pm - Pager - [phone] After 6pm go to [email or web address] - password [email or web address] [facility]  Office  [phone]  CC: Primary care physician; Cletis Athens, MD  Note: This dictation was prepared with Dragon dictation along with smaller phrase technology. Any transcriptional errors that result from this process are unintentional.

## 2018-11-16 ENCOUNTER — Inpatient Hospital Stay: Payer: Medicare Other

## 2018-11-16 LAB — GLUCOSE, CAPILLARY
Glucose-Capillary: 207 mg/dL — ABNORMAL HIGH (ref 70–99)
Glucose-Capillary: 226 mg/dL — ABNORMAL HIGH (ref 70–99)
Glucose-Capillary: 246 mg/dL — ABNORMAL HIGH (ref 70–99)
Glucose-Capillary: 212 mg/dL — ABNORMAL HIGH (ref 70–99)

## 2018-11-16 LAB — CBC
HCT: 27.4 % — ABNORMAL LOW (ref 39.0–52.0)
Hemoglobin: 8.4 g/dL — ABNORMAL LOW (ref 13.0–17.0)
MCH: 25.8 pg — ABNORMAL LOW (ref 26.0–34.0)
MCHC: 30.7 g/dL (ref 30.0–36.0)
MCV: 84.3 fL (ref 80.0–100.0)
PLATELETS: 320 10*3/uL (ref 150–400)
RBC: 3.25 MIL/uL — ABNORMAL LOW (ref 4.22–5.81)
RDW: 15.8 % — ABNORMAL HIGH (ref 11.5–15.5)
WBC: 8.9 10*3/uL (ref 4.0–10.5)
nRBC: 0 % (ref 0.0–0.2)

## 2018-11-16 LAB — BASIC METABOLIC PANEL
Anion gap: 10 (ref 5–15)
BUN: 61 mg/dL — ABNORMAL HIGH (ref 8–23)
CO2: 24 mmol/L (ref 22–32)
Calcium: 8.6 mg/dL — ABNORMAL LOW (ref 8.9–10.3)
Chloride: 105 mmol/L (ref 98–111)
Creatinine, Ser: 2.45 mg/dL — ABNORMAL HIGH (ref 0.61–1.24)
GFR, EST AFRICAN AMERICAN: 28 mL/min — AB (ref >60)
GFR, EST NON AFRICAN AMERICAN: 24 mL/min — AB (ref >60)
Glucose, Bld: 202 mg/dL — ABNORMAL HIGH (ref 70–99)
Potassium: 4.5 mmol/L (ref 3.5–5.1)
Sodium: 139 mmol/L (ref 135–145)

## 2018-11-16 LAB — HEPARIN LEVEL (UNFRACTIONATED): Heparin Unfractionated: 0.4 IU/mL (ref 0.30–0.70)

## 2018-11-16 LAB — TROPONIN I: Troponin I: 3.42 ng/mL (ref <0.03)

## 2018-11-16 NOTE — Progress Notes (Signed)
[facility]  SUBJECTIVE: Patient eating breakfast, denies chest pain   Vitals:   11/15/18 1629 11/15/18 1958 11/16/18 0307 11/16/18 0741  BP: (!) 114/48 (!) 126/53 (!) 126/48 (!) 114/45  Pulse: 61 64 61 61  Resp:  20 18 18   Temp: 98.2 F (36.8 C) 98.5 F (36.9 C) 98.4 F (36.9 C) 98 F (36.7 C)  TempSrc: Oral Oral Oral   SpO2: 93% 92% 93% 93%  Weight:   97.5 kg   Height:         Intake/Output Summary (Last 24 hours) at 11/16/2018 4268 Last data filed at 11/16/2018 3419 Gross per 24 hour  Intake 686.35 ml  Output 961 ml  Net -274.65 ml      PHYSICAL EXAM  General: Well developed, well nourished, in no acute distress HEENT:  Normocephalic and atramatic Neck:  No JVD.  Lungs: Clear bilaterally to auscultation and percussion. Heart: HRRR . Normal S1 and S2 without gallops or murmurs.  Abdomen: Bowel sounds are positive, abdomen soft and non-tender  Msk:  Back normal, normal gait. Normal strength and tone for age. Extremities: No clubbing, cyanosis or edema.   Neuro: Alert and oriented X 3. Psych:  Good affect, responds appropriately   LABS: Basic Metabolic Panel: Recent Labs    11/15/18 0406 11/16/18 0419  NA 134* 139  K 5.5* 4.5  CL 106 105  CO2 20* 24  GLUCOSE 267* 202*  BUN 46* 61*  CREATININE 2.00* 2.45*  CALCIUM 8.8* 8.6*   Liver Function Tests: No results for input(s): AST, ALT, ALKPHOS, BILITOT, PROT, ALBUMIN in the last 72 hours. No results for input(s): LIPASE, AMYLASE in the last 72 hours. CBC: Recent Labs    11/15/18 0406 11/16/18 0419  WBC 11.3* 8.9  HGB 8.6* 8.4*  HCT 28.9* 27.4*  MCV 86.3 84.3  PLT 335 320   Cardiac Enzymes: Recent Labs    11/15/18 1313 11/15/18 1833 11/16/18 0010  TROPONINI 6.06* 5.11* 3.42*   BNP: Invalid input(s): POCBNP D-Dimer: No results for input(s): DDIMER in the last 72 hours. Hemoglobin A1C: Recent Labs    11/15/18 0406  HGBA1C 7.2*   Fasting Lipid Panel: Recent Labs    11/15/18 0406   CHOL 112  HDL 27*  LDLCALC 51  TRIG 171*  CHOLHDL 4.1   Thyroid Function Tests: No results for input(s): TSH, T4TOTAL, T3FREE, THYROIDAB in the last 72 hours.  Invalid input(s): FREET3 Anemia Panel: No results for input(s): VITAMINB12, FOLATE, FERRITIN, TIBC, IRON, RETICCTPCT in the last 72 hours.  Dg Chest Portable 1 View  Result Date: 11/14/2018 CLINICAL DATA:  Central chest and left arm pain. EXAM: PORTABLE CHEST 1 VIEW COMPARISON:  09/04/2018 FINDINGS: The heart size and mediastinal contours are within normal limits. Both lungs are clear. The visualized skeletal structures are unremarkable. IMPRESSION: No active disease. Electronically Signed   By: Ashley Royalty M.D.   On: 11/14/2018 22:23     Echo V EF 55 to 65%  TELEMETRY: This rhythm:  ASSESSMENT AND PLAN:  Active Problems:   Chest pain    1.  Non-ST elevation myocardial infarction.  The patient had similar hospitalization 05/09/2017 at which time troponin peaked at 21.51, cardiac catheterization revealed in-stent RCA with 75% stenosis ostium left circumflex to be amenable to PCI, treated medically.  Patient currently scheduled for stress MRI next week at [facility].  Patient is followed by Dr. Lujean Amel who told the patient if he required cardiac catheterization that this should be done at  [place] in light of his baseline comorbidities.  2.  Acute on chronic kidney disease, BUN/creatinine 61 and 2.45 3.  Essential hypertension 4.  Anemia, hemoglobin manic at 9.3 and 30.1, respectively.  Recommendations  1.  Agree with current therapy 2.  DC heparin 3.  For cardiac catheterization at this time in light of worsening renal status 4.  Follow-up with Dr. Clayborn Bigness next week   Isaias Cowman, MD, PhD, [facility] 11/16/2018 9:22 AM

## 2018-11-16 NOTE — Consult Note (Signed)
ANTICOAGULATION CONSULT NOTE - Initial Consult  Pharmacy Consult for heparin drip Indication: chest pain/ACS  Allergies  Allergen Reactions  . Gabapentin     Other reaction(s): Other (See Comments) Tremors  . Peanut-Containing Drug Products Anaphylaxis  . Penicillins Hives and Rash    Has patient had a PCN reaction causing immediate rash, facial/tongue/throat swelling, SOB or lightheadedness with hypotension: Yes Has patient had a PCN reaction causing severe rash involving mucus membranes or skin necrosis: No Has patient had a PCN reaction that required hospitalization No Has patient had a PCN reaction occurring within the last 10 years: No If all of the above answers are "NO", then may proceed with Cephalosporin use.  . Bee Venom Swelling  . Influenza Vaccines Hives  . Inh [Isoniazid] Hives  . Kenalog [Triamcinolone Acetonide] Hives  . Levaquin [Levofloxacin] Other (See Comments)    Tendon, ligament pain.   . Nalfon [Fenoprofen Calcium] Hives  . Naproxen   . Peanut Oil   . Nsaids Rash    Nalfon 600 Nalfon 600    Patient Measurements: Height: 5\' 6"  (167.6 cm) Weight: 214 lb 15.2 oz (97.5 kg) IBW/kg (Calculated) : 63.8 Heparin Dosing Weight: 82.6kg  Vital Signs: Temp: 98.4 F (36.9 C) (12/08 0307) Temp Source: Oral (12/08 0307) BP: 126/48 (12/08 0307) Pulse Rate: 61 (12/08 0307)  Labs: Recent Labs    11/14/18 2139 11/15/18 0406 11/15/18 1313 11/15/18 1833 11/15/18 1904 11/16/18 0010 11/16/18 0419  HGB 9.3* 8.6*  --   --   --   --  8.4*  HCT 30.1* 28.9*  --   --   --   --  27.4*  PLT 350 335  --   --   --   --  320  APTT 33  --   --   --   --   --   --   LABPROT 12.9  --   --   --   --   --   --   INR 0.98  --   --   --   --   --   --   HEPARINUNFRC  --   --   --   --  0.47  --  0.40  CREATININE 1.96* 2.00*  --   --   --   --  2.45*  TROPONINI 0.05* 3.65* 6.06* 5.11*  --  3.42*  --     Estimated Creatinine Clearance: 27.2 mL/min (A) (by C-G formula based  on SCr of 2.45 mg/dL (H)).   Medical History: Past Medical History:  Diagnosis Date  . Arthritis   . Atrioventricular canal (AVC)    irregular heart beats  . Barrett esophagus   . Cancer [facility]) 2002   prostate  . Colon polyp   . Diabetes mellitus without complication ([place])   . Diverticulosis   . Gout   . Heart disease   . Hemangioma    liver  . Hyperlipidemia   . Hypertension   . Myocardial infarct ([place])   . Ocular hypertension   . Peripheral vascular disease ([place])   . Skin cancer   . Sleep apnea   . Vitreoretinal degeneration     Medications:  Scheduled:  . allopurinol  200 mg Oral BID  . amLODipine  5 mg Oral Daily  . citalopram  10 mg Oral Daily  . clopidogrel  75 mg Oral Daily  . docusate sodium  100 mg Oral BID  . fluticasone  2 spray Each Nare Daily  .  fluticasone furoate-vilanterol  1 puff Inhalation Daily  . furosemide  40 mg Intravenous Daily  . insulin aspart  0-5 Units Subcutaneous QHS  . insulin aspart  0-9 Units Subcutaneous TID WC  . insulin glargine  40 Units Subcutaneous QHS  . isosorbide mononitrate  120 mg Oral Daily  . loratadine  10 mg Oral Daily  . losartan  25 mg Oral Daily  . magnesium oxide  400 mg Oral Daily  . methocarbamol  500 mg Oral BID  . metoprolol tartrate  50 mg Oral BID  . pantoprazole  40 mg Oral BID  . rosuvastatin  40 mg Oral Daily  . Vitamin D (Ergocalciferol)  50,000 Units Oral Q30 days    Assessment: Patient is a 77 year old male who presents with chest pain, found to have elevated troponin. Pharmacy consulted to dose heparin drip. Last subq heparin injection at 0100, no anticoag PTA. Baseline labs ordered last night.   Patient is currently therapeutic with HL = 0.47  Goal of Therapy:  Heparin level 0.3-0.7 units/ml Monitor platelets by anticoagulation protocol: Yes   Plan:  12/08 @ 0400 HL 0.40 therapeutic. Will continue current rate and will recheck HL w/ am labs. Hgb stable around 8.4 - 8.6, trops trending down,  will continue to monitor.  Tobie Lords, PharmD, BCPS Clinical Pharmacist 11/16/2018 5:26 AM

## 2018-11-16 NOTE — Progress Notes (Signed)
[place] at [place] NAME: Boy Delamater    MR#:  [id number]  DATE OF BIRTH:  [birth year]  SUBJECTIVE:   Patient does not have chest pain or shortness of breath.  He feels well this morning.  No acute events overnight. REVIEW OF SYSTEMS:    Review of Systems  Constitutional: Negative for fever, chills weight loss HENT: Negative for ear pain, nosebleeds, congestion, facial swelling, rhinorrhea, neck pain, neck stiffness and ear discharge.   Respiratory: Negative for cough, shortness of breath, wheezing  Cardiovascular: Negative for chest pain, palpitations and leg swelling.  Gastrointestinal: Negative for heartburn, abdominal pain, vomiting, diarrhea or consitpation Genitourinary: Negative for dysuria, urgency, frequency, hematuria Musculoskeletal: Negative for back pain or joint pain Neurological: Negative for dizziness, seizures, syncope, focal weakness,  numbness and headaches.  Hematological: Does not bruise/bleed easily.  Psychiatric/Behavioral: Negative for hallucinations, confusion, dysphoric mood    Tolerating Diet: yes      DRUG ALLERGIES:   Allergies  Allergen Reactions  . Gabapentin     Other reaction(s): Other (See Comments) Tremors  . Peanut-Containing Drug Products Anaphylaxis  . Penicillins Hives and Rash    Has patient had a PCN reaction causing immediate rash, facial/tongue/throat swelling, SOB or lightheadedness with hypotension: Yes Has patient had a PCN reaction causing severe rash involving mucus membranes or skin necrosis: No Has patient had a PCN reaction that required hospitalization No Has patient had a PCN reaction occurring within the last 10 years: No If all of the above answers are "NO", then may proceed with Cephalosporin use.  . Bee Venom Swelling  . Influenza Vaccines Hives  . Inh [Isoniazid] Hives  . Kenalog [Triamcinolone Acetonide] Hives  . Levaquin [Levofloxacin] Other (See Comments)   Tendon, ligament pain.   . Nalfon [Fenoprofen Calcium] Hives  . Naproxen   . Peanut Oil   . Nsaids Rash    Nalfon 600 Nalfon 600    VITALS:  Blood pressure (!) 114/45, pulse 61, temperature 98 F (36.7 C), resp. rate 18, height 5\' 6"  (1.676 m), weight 97.5 kg, SpO2 93 %.  PHYSICAL EXAMINATION:  Constitutional: Appears well-developed and well-nourished. No distress. HENT: Normocephalic. Marland Kitchen Oropharynx is clear and moist.  Eyes: Conjunctivae and EOM are normal. PERRLA, no scleral icterus.  Neck: Normal ROM. Neck supple. No JVD. No tracheal deviation. CVS: RRR, S1/S2 +, no murmurs, no gallops, no carotid bruit.  Pulmonary: Effort and breath sounds normal, Bilateral lower lobe crackles no wheezing Abdominal: Soft. BS +,  no distension, tenderness, rebound or guarding.  Musculoskeletal: Normal range of motion. No edema and no tenderness.  Neuro: Alert. CN 2-12 grossly intact. No focal deficits. Skin: Skin is warm and dry. No rash noted. Psychiatric: Normal mood and affect.      LABORATORY PANEL:   CBC Recent Labs  Lab 11/16/18 0419  WBC 8.9  HGB 8.4*  HCT 27.4*  PLT 320   ------------------------------------------------------------------------------------------------------------------  Chemistries  Recent Labs  Lab 11/16/18 0419  NA 139  K 4.5  CL 105  CO2 24  GLUCOSE 202*  BUN 61*  CREATININE 2.45*  CALCIUM 8.6*   ------------------------------------------------------------------------------------------------------------------  Cardiac Enzymes Recent Labs  Lab 11/15/18 1313 11/15/18 1833 11/16/18 0010  TROPONINI 6.06* 5.11* 3.42*   ------------------------------------------------------------------------------------------------------------------  RADIOLOGY:  Dg Chest Portable 1 View  Result Date: 11/14/2018 CLINICAL DATA:  Central chest and left arm pain. EXAM: PORTABLE CHEST 1 VIEW COMPARISON:  09/04/2018 FINDINGS: The heart size and mediastinal  contours  are within normal limits. Both lungs are clear. The visualized skeletal structures are unremarkable. IMPRESSION: No active disease. Electronically Signed   By: Ashley Royalty M.D.   On: 11/14/2018 22:23     ASSESSMENT AND PLAN:   77 year old male with history of diabetes, chronic kidney disease stage III prostate cancer who presents to the ER due to shortness of breath and chest pain.  1.  Non-STEMI: Troponin max is 6.  Troponins are now trending down.   Cardiology has recommended discontinuing heparin drip and monitoring recurrent chest pain.  Continue Plavix, isosorbide, losartan, metoprolol, statin Patient scheduled for stress MRI next week at [facility] No cardiac catheterization due to kidney issues.   2.  Acute on chronic kidney disease stage III: Creatinine is increased slightly. Stop Lasix and follow BMP for a.m.  3.  Essential hypertension: Continue metoprolol, losartan, isosorbide  4.  Diabetes: Continue Lantus with sliding scale and ADA diet  5.  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  I will discontinue Lasix for now.   Consider discontinuing losartan as well  Will await until nephrology sees patient today.    CHF clinic upon discharge  6.  Hyperkalemia: Treated with dextrose and insulin  7.  Anemia of chronic disease: Continue to monitor.  Case discussed with Dr. Lorinda Creed and Dr. Holley Raring Management plans discussed with the patient and he is in agreement.  CODE STATUS: full  TOTAL TIME TAKING CARE OF THIS PATIENT: 24 minutes.     POSSIBLE D/C1- 2 days, DEPENDING ON CLINICAL CONDITION.   Rani Sisney M.D on 11/16/2018 at 11:35 AM  Between 7am to 6pm - Pager - [phone] After 6pm go to [email or web address] - password [email or web address] [facility]  Office  [phone]  CC: Primary care physician; Cletis Athens, MD  Note: This dictation was prepared with Dragon dictation along with smaller  phrase technology. Any transcriptional errors that result from this process are unintentional.

## 2018-11-16 NOTE — Progress Notes (Signed)
Patient coughed up small amount of blood this evening.  Patient had been on heparin drip which was d/c'd this am.  Patient had been coughing to try to " clear mucous". Patient with no complaints of chest pain or SOB. Patient verbalizes understanding to call nurse if he has repeat episode.

## 2018-11-16 NOTE — Plan of Care (Signed)
  Problem: Clinical Measurements: Goal: Respiratory complications will improve Outcome: Progressing Note:  Pt on room air   Problem: Pain Managment: Goal: General experience of comfort will improve Outcome: Progressing Note:  No complaints of pain this shift   Problem: Cardiac: Goal: Ability to achieve and maintain adequate cardiovascular perfusion will improve Outcome: Progressing Note:  Heparin gtt infusing @ 10   Problem: Education: Goal: Knowledge of General Education information will improve Description Including pain rating scale, medication(s)/side effects and non-pharmacologic comfort measures Outcome: Completed/Met

## 2018-11-17 LAB — GLUCOSE, CAPILLARY
Glucose-Capillary: 204 mg/dL — ABNORMAL HIGH (ref 70–99)
Glucose-Capillary: 230 mg/dL — ABNORMAL HIGH (ref 70–99)

## 2018-11-17 LAB — BASIC METABOLIC PANEL
Anion gap: 9 (ref 5–15)
BUN: 65 mg/dL — ABNORMAL HIGH (ref 8–23)
CALCIUM: 8.4 mg/dL — AB (ref 8.9–10.3)
CO2: 23 mmol/L (ref 22–32)
Chloride: 104 mmol/L (ref 98–111)
Creatinine, Ser: 2.35 mg/dL — ABNORMAL HIGH (ref 0.61–1.24)
GFR calc non Af Amer: 26 mL/min — ABNORMAL LOW (ref >60)
GFR, EST AFRICAN AMERICAN: 30 mL/min — AB (ref >60)
Glucose, Bld: 219 mg/dL — ABNORMAL HIGH (ref 70–99)
Potassium: 4.2 mmol/L (ref 3.5–5.1)
SODIUM: 136 mmol/L (ref 135–145)

## 2018-11-17 LAB — CBC
HCT: 26.8 % — ABNORMAL LOW (ref 39.0–52.0)
Hemoglobin: 8.2 g/dL — ABNORMAL LOW (ref 13.0–17.0)
MCH: 25.7 pg — AB (ref 26.0–34.0)
MCHC: 30.6 g/dL (ref 30.0–36.0)
MCV: 84 fL (ref 80.0–100.0)
Platelets: 308 10*3/uL (ref 150–400)
RBC: 3.19 MIL/uL — ABNORMAL LOW (ref 4.22–5.81)
RDW: 15.3 % (ref 11.5–15.5)
WBC: 9.4 10*3/uL (ref 4.0–10.5)
nRBC: 0 % (ref 0.0–0.2)

## 2018-11-17 MED ORDER — METOPROLOL TARTRATE 75 MG PO TABS: 50.0000 mg | ORAL_TABLET | Freq: Two times a day (BID) | ORAL | Status: DC

## 2018-11-17 MED ORDER — FUROSEMIDE 20 MG PO TABS: 40.0000 mg | ORAL_TABLET | Freq: Every day | ORAL | Status: DC

## 2018-11-17 MED ORDER — COLCHICINE 0.6 MG PO TABS: 0.6000 mg | ORAL_TABLET | Freq: Every day | ORAL | 0 refills | Status: DC

## 2018-11-17 NOTE — Progress Notes (Addendum)
Cardiovascular and Pulmonary Nurse Navigator Note:    77 year old male with history of diabetes, CKD III, prostate cancer who presented to the ER due to SOB.  Patient ruled in for NSTEMI.  Troponin max of 6.06.  Due to CKD with BUN 65 and Creatinine of 2.35 cardiac catheterization was not performed.    Patient being discharged today.    Active Problem List this admission:   1. NSTEMI - Cardiology following patient.  Patient on plavix, isosorbide, losartan, metoprolol and statin.  2. Acute on chronic kidney disease III - Nephrology following patient.   3. HTN - Patient on metoprolol, losartan and isosorbide 4. DM 5. 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   6. Hyperkalemia 7. Anemia of chronic disease   ________________________________________________ Patient has cardiac stress MRI scheduled at [facility] on 11/24/2018 at 1:30 p.m.  Patient will follow-up with Dr. Clayborn Bigness on 11/27/2018 after Cardiac Stress MRI.  Patient had questions about the Cardiac Stress MRI - questions about location - questions about medications to take, etc.  This RN contacted [facility] to obtain phone number for [place] MRI area to help answer patient's questions.    The following information was given and reviewed with patient:    Mike Decker Stress Cardiac MRI on 11/24/2018 at 1:30 p.m.    [facility] in [place] (Adjacent to [facility]) [place] ([place]) Parking Lot is across street from the building.   [place], [place]  [address]  Nurse Line [phone]  Hold Lasix the morning of the test.    24 hours before test:   No Caffeine - No Decaffeinated Drinks No Chocolate  No Energy Drinks  ______________________________________________  CHF EDUCATION:   Patient's assigned RN, Mike Landry RN, reviewed the packet - "Living Better with Heart Failure" with patient.   NSTEMI Education:    Discussed modifiable risk factors including controlling blood pressure, cholesterol, and blood sugar; following heart healthy diet; maintaining healthy weight; exercise; and smoking cessation, if applicable.   ? Discussed cardiac medications including rationale for taking, mechanisms of action, and side effects. Stressed the importance of taking medications as prescribed.  ? Discussed emergency plan for heart attack symptoms. Patient verbalized understanding of need to call 911 and not to drive himself to ER if having cardiac symptoms / chest pain.  ? Heart healthy diet of low sodium, low fat, low cholesterol carb modified heart healthy diet discussed. Information on diet provided.   Smoking Cessation - Patient is a FORMER smoker.   ? Exercise - Benefits of exercised discussed. Informed patient that his cardiologist has referred him to outpatient Cardiac Rehab. An overview of the program was provided. Patient informed this RN that he has participated in Cardiac Rehab in the past - approximately  3 to 4 years ago.  Patient understands that he needs to have the Cardiac Stress MRI and any other cardiac outpatient tests prior to starting Cardiac Rehab. Brochure provided.   (Note:  Patient having Cardiac Stress MRI instead of Cardiac Cath due to patient's kidney function (CKD III).    [facility] -  Explained to patient the role of the [facility].  Explained to patient the [facility] does not replace his PCP or cardiologist, but provides an additional resource to help him manage his heart failure.  Brochure provided.  New Patient appointment scheduled for 12/09/2018  at 11:20 a.m.    Other follow-up appointments: To see Dr. Juleen China, Nephrology in 3 days.  His office will call patient at home.   11/24/2018 Stress Cardiac MRI at [facility] at 1:30 p.m.  11/25/2018 Dr. Lavera Guise @ 11:00 a.m.   11/27/2018 Dr. Clayborn Bigness @ 11:00 a.m.   12/09/2018 [facility] @ 11:20 a.m.     Patient appreciative of the above information.  ? Roanna Epley, RN, BSN, [place]  [facility]  Cardiovascular & Pulmonary Nurse Navigator  Direct Line: [phone]  Department Phone #: [phone] Fax: [phone]  Email Address: [email or web address]

## 2018-11-17 NOTE — Discharge Instructions (Signed)

## 2018-11-17 NOTE — Plan of Care (Signed)
  Problem: Clinical Measurements: Goal: Respiratory complications will improve Outcome: Progressing   Problem: Pain Managment: Goal: General experience of comfort will improve Outcome: Progressing Note:  No complaints of pain this shift

## 2018-11-17 NOTE — Progress Notes (Signed)
[facility]  ROUNDING NOTE   Subjective:   Continues to complain of shortness of breath.   Plan on discharge today. Patient is concerned because his wife is unable to take care of him.   Objective:  Vital signs in last 24 hours:  Temp:  [98 F (36.7 C)-98.3 F (36.8 C)] 98.2 F (36.8 C) (12/09 0801) Pulse Rate:  [55-67] 55 (12/09 0801) Resp:  [17-24] 24 (12/09 0424) BP: (120-135)/(46-58) 127/50 (12/09 0801) SpO2:  [94 %-98 %] 96 % (12/09 0801) Weight:  [88.2 kg] 88.2 kg (12/09 0500)  Weight change: -9.275 kg Filed Weights   11/15/18 0300 11/16/18 0307 11/17/18 0500  Weight: 89.5 kg 97.5 kg 88.2 kg    Intake/Output: I/O last 3 completed shifts: In: 889.6 [P.O.:720; I.V.:169.6] Out: 1760 [Urine:1760]   Intake/Output this shift:  Total I/O In: -  Out: 600 [Urine:600]  Physical Exam: General: No acute distress  Head: Normocephalic, atraumatic. Moist oral mucosal membranes  Eyes: Anicteric  Neck: Supple, trachea midline  Lungs:  Clear to auscultation, normal effort  Heart: S1S2 no rubs  Abdomen:  Soft, nontender, bowel sounds present  Extremities: No peripheral edema.  Neurologic: Awake, alert, following commands  Skin: No lesions        Basic Metabolic Panel: Recent Labs  Lab 11/14/18 2139 11/15/18 0406 11/16/18 0419 11/17/18 0343  NA 134* 134* 139 136  K 5.4* 5.5* 4.5 4.2  CL 105 106 105 104  CO2 18* 20* 24 23  GLUCOSE 251* 267* 202* 219*  BUN 44* 46* 61* 65*  CREATININE 1.96* 2.00* 2.45* 2.35*  CALCIUM 9.0 8.8* 8.6* 8.4*    Liver Function Tests: No results for input(s): AST, ALT, ALKPHOS, BILITOT, PROT, ALBUMIN in the last 168 hours. No results for input(s): LIPASE, AMYLASE in the last 168 hours. No results for input(s): AMMONIA in the last 168 hours.  CBC: Recent Labs  Lab 11/14/18 2139 11/15/18 0406 11/16/18 0419 11/17/18 0343  WBC 12.4* 11.3* 8.9 9.4  HGB 9.3* 8.6* 8.4* 8.2*  HCT 30.1* 28.9* 27.4* 26.8*  MCV 85.0 86.3  84.3 84.0  PLT 350 335 320 308    Cardiac Enzymes: Recent Labs  Lab 11/14/18 2139 11/15/18 0406 11/15/18 1313 11/15/18 1833 11/16/18 0010  TROPONINI 0.05* 3.65* 6.06* 5.11* 3.42*    BNP: Invalid input(s): POCBNP  CBG: Recent Labs  Lab 11/16/18 0738 11/16/18 1147 11/16/18 1741 11/16/18 2150 11/17/18 0759  GLUCAP 207* 226* 246* 212* 204*    Microbiology: Results for orders placed or performed during the hospital encounter of 05/06/17  Blood Culture (routine x 2)     Status: None   Collection Time: 05/06/17  7:02 PM  Result Value Ref Range Status   Specimen Description BLOOD RIGHT HAND  Final   Special Requests   Final    BOTTLES DRAWN AEROBIC AND ANAEROBIC Blood Culture adequate volume   Culture NO GROWTH 5 DAYS  Final   Report Status 05/11/2017 FINAL  Final  Blood Culture (routine x 2)     Status: None   Collection Time: 05/06/17  7:02 PM  Result Value Ref Range Status   Specimen Description BLOOD LEFT AC  Final   Special Requests   Final    BOTTLES DRAWN AEROBIC AND ANAEROBIC Blood Culture adequate volume   Culture NO GROWTH 5 DAYS  Final   Report Status 05/11/2017 FINAL  Final  Urine culture     Status: None   Collection Time: 05/06/17  7:18 PM  Result  Value Ref Range Status   Specimen Description URINE, RANDOM  Final   Special Requests NONE  Final   Culture   Final    NO GROWTH Performed at [facility], [address] [place], [place] [address]    Report Status 05/08/2017 FINAL  Final  Gastrointestinal Panel by PCR , Stool     Status: Abnormal   Collection Time: 05/07/17  4:44 AM  Result Value Ref Range Status   Campylobacter species NOT DETECTED NOT DETECTED Final   Plesimonas shigelloides NOT DETECTED NOT DETECTED Final   Salmonella species DETECTED (A) NOT DETECTED Final    Comment: RESULT CALLED TO, READ BACK BY AND VERIFIED WITH: BETH BUONO 05/07/17 0642 ALV    Yersinia enterocolitica NOT DETECTED NOT DETECTED Final   Vibrio species  NOT DETECTED NOT DETECTED Final   Vibrio cholerae NOT DETECTED NOT DETECTED Final   Enteroaggregative E coli (EAEC) NOT DETECTED NOT DETECTED Final   Enteropathogenic E coli (EPEC) NOT DETECTED NOT DETECTED Final   Enterotoxigenic E coli (ETEC) NOT DETECTED NOT DETECTED Final   Shiga like toxin producing E coli (STEC) NOT DETECTED NOT DETECTED Final   E. coli O157 NOT DETECTED NOT DETECTED Final   Shigella/Enteroinvasive E coli (EIEC) NOT DETECTED NOT DETECTED Final   Cryptosporidium NOT DETECTED NOT DETECTED Final   Cyclospora cayetanensis NOT DETECTED NOT DETECTED Final   Entamoeba histolytica NOT DETECTED NOT DETECTED Final   Giardia lamblia NOT DETECTED NOT DETECTED Final   Adenovirus F40/41 NOT DETECTED NOT DETECTED Final   Astrovirus NOT DETECTED NOT DETECTED Final   Norovirus GI/GII NOT DETECTED NOT DETECTED Final   Rotavirus A NOT DETECTED NOT DETECTED Final   Sapovirus (I, II, IV, and V) NOT DETECTED NOT DETECTED Final  C difficile quick scan w PCR reflex     Status: None   Collection Time: 05/07/17  4:44 AM  Result Value Ref Range Status   C Diff antigen NEGATIVE NEGATIVE Final   C Diff toxin NEGATIVE NEGATIVE Final   C Diff interpretation No C. difficile detected.  Final  MRSA PCR Screening     Status: None   Collection Time: 05/07/17  6:24 AM  Result Value Ref Range Status   MRSA by PCR NEGATIVE NEGATIVE Final    Comment:        The GeneXpert MRSA Assay (FDA approved for NASAL specimens only), is one component of a comprehensive MRSA colonization surveillance program. It is not intended to diagnose MRSA infection nor to guide or monitor treatment for MRSA infections.     Coagulation Studies: Recent Labs    11/14/18 Mar 19, 2138  LABPROT 12.9  INR 0.98    Urinalysis: No results for input(s): COLORURINE, LABSPEC, PHURINE, GLUCOSEU, HGBUR, BILIRUBINUR, KETONESUR, PROTEINUR, UROBILINOGEN, NITRITE, LEUKOCYTESUR in the last 72 hours.  Invalid input(s): APPERANCEUR     Imaging: Dg Chest 1 View  Result Date: 11/16/2018 CLINICAL DATA:  History of pneumonia. EXAM: CHEST  1 VIEW COMPARISON:  11/14/2018 FINDINGS: The heart is enlarged but stable. No infiltrates, edema or effusions. The bony thorax is intact. IMPRESSION: No acute cardiopulmonary findings.  Stable cardiac enlargement. Electronically Signed   By: Marijo Sanes M.D.   On: 11/16/2018 13:11     Medications:    . allopurinol  200 mg Oral BID  . amLODipine  5 mg Oral Daily  . citalopram  10 mg Oral Daily  . clopidogrel  75 mg Oral Daily  . docusate sodium  100 mg Oral  BID  . fluticasone  2 spray Each Nare Daily  . fluticasone furoate-vilanterol  1 puff Inhalation Daily  . insulin aspart  0-5 Units Subcutaneous QHS  . insulin aspart  0-9 Units Subcutaneous TID WC  . insulin glargine  40 Units Subcutaneous QHS  . isosorbide mononitrate  120 mg Oral Daily  . loratadine  10 mg Oral Daily  . losartan  25 mg Oral Daily  . magnesium oxide  400 mg Oral Daily  . methocarbamol  500 mg Oral BID  . metoprolol tartrate  50 mg Oral BID  . pantoprazole  40 mg Oral BID  . rosuvastatin  40 mg Oral Daily  . Vitamin D (Ergocalciferol)  50,000 Units Oral Q30 days   acetaminophen **OR** acetaminophen, bisacodyl, HYDROcodone-acetaminophen, ipratropium-albuterol, nitroGLYCERIN, ondansetron **OR** ondansetron (ZOFRAN) IV, traMADol, traZODone  Assessment/ Plan:  [name] is a 77 y.o. 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  1.  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 GFR appears to be at its baseline.   - Continue losartan  2.  Hypertension.  Continue amlodipine, losartan, and metoprolol. - continue home furosemide dose  3.  Anemia chronic kidney disease.  Hemoglobin 8.2  4.   Gout.   Continue allopurinol 200 mg p.o. twice daily   Will need outpatient follow up with my office. Labs at the end of the week.    LOS: 3 Kylee Umana 12/9/201910:56 AM

## 2018-11-17 NOTE — Progress Notes (Signed)
[facility] Cardiology  SUBJECTIVE: The patient denies recurrent chest pain. He does report intermittent exertional shortness of breath, which has been present for about 3 months.   Vitals:   11/16/18 1918 11/17/18 0424 11/17/18 0500 11/17/18 0801  BP: (!) 133/58 (!) 135/53  (!) 127/50  Pulse: 67 (!) 59  (!) 55  Resp: (!) 24 (!) 24    Temp: 98.2 F (36.8 C) 98 F (36.7 C)  98.2 F (36.8 C)  TempSrc: Oral Oral  Oral  SpO2: 95% 98%  96%  Weight:   88.2 kg   Height:         Intake/Output Summary (Last 24 hours) at 11/17/2018 4196 Last data filed at 11/17/2018 0229 Gross per 24 hour  Intake 755.98 ml  Output 1500 ml  Net -744.02 ml      PHYSICAL EXAM  General: Well developed, well nourished, in no acute distress, lying in bed HEENT:  Normocephalic and atramatic Neck:  No JVD.  Lungs: Clear bilaterally to auscultation, normal effort of breathing Heart: HRRR . Normal S1 and S2 without gallops or murmurs.  Abdomen: nondistended Msk:  No obvious deformity. Gait not assessed. Extremities: No clubbing, cyanosis or edema.   Neuro: Alert and oriented X 3. Psych:  Good affect, responds appropriately   LABS: Basic Metabolic Panel: Recent Labs    11/16/18 0419 11/17/18 0343  NA 139 136  K 4.5 4.2  CL 105 104  CO2 24 23  GLUCOSE 202* 219*  BUN 61* 65*  CREATININE 2.45* 2.35*  CALCIUM 8.6* 8.4*   Liver Function Tests: No results for input(s): AST, ALT, ALKPHOS, BILITOT, PROT, ALBUMIN in the last 72 hours. No results for input(s): LIPASE, AMYLASE in the last 72 hours. CBC: Recent Labs    11/16/18 0419 11/17/18 0343  WBC 8.9 9.4  HGB 8.4* 8.2*  HCT 27.4* 26.8*  MCV 84.3 84.0  PLT 320 308   Cardiac Enzymes: Recent Labs    11/15/18 1313 11/15/18 1833 11/16/18 0010  TROPONINI 6.06* 5.11* 3.42*   BNP: Invalid input(s): POCBNP D-Dimer: No results for input(s): DDIMER in the last 72 hours. Hemoglobin A1C: Recent Labs    11/15/18 0406  HGBA1C 7.2*   Fasting  Lipid Panel: Recent Labs    11/15/18 0406  CHOL 112  HDL 27*  LDLCALC 51  TRIG 171*  CHOLHDL 4.1   Thyroid Function Tests: No results for input(s): TSH, T4TOTAL, T3FREE, THYROIDAB in the last 72 hours.  Invalid input(s): FREET3 Anemia Panel: No results for input(s): VITAMINB12, FOLATE, FERRITIN, TIBC, IRON, RETICCTPCT in the last 72 hours.  Dg Chest 1 View  Result Date: 11/16/2018 CLINICAL DATA:  History of pneumonia. EXAM: CHEST  1 VIEW COMPARISON:  11/14/2018 FINDINGS: The heart is enlarged but stable. No infiltrates, edema or effusions. The bony thorax is intact. IMPRESSION: No acute cardiopulmonary findings.  Stable cardiac enlargement. Electronically Signed   By: Marijo Sanes M.D.   On: 11/16/2018 13:11     Echo 55-65%  TELEMETRY: sinus rhythm, 62 bpm  ASSESSMENT AND PLAN:  Active Problems:   Chest pain    1. Non-STEMI, with peak troponin 6.06, trending down to 3.42, without recurrent chest pain. The patient had a similar hospitalization 05/09/2017 at which time troponin peaked at 21.51, cardiac catheterization revealed in-stent RCA with 75% stenosis ostium left circumflex, felt not to be amenable to PCI, treated medically.  Patient currently scheduled for stress MRI next week at [facility].  Patient is followed by Dr. Lujean Amel who  told the patient if he required cardiac catheterization that this should be done at [facility] in light of his baseline comorbidities.  2.  Acute on chronic kidney disease, BUN/creatinine 65 and 2.35 3.  Essential hypertension 4.  Anemia, hemoglobin and hematocrit at 8.2 and 26.8, respectively.  Recommendations: 1. Defer cardiac catheterization in light of worsening renal status 2. Proceed with stress MRI as scheduled at [facility] next week 3. Continue aspirin, Plavix, Imdur, losartan, metoprolol tartrate, and Crestor 4. Avoid nephrotoxic agents, including Lasix 5. Closely monitor anemia 6. The patient may be discharged today from a cardiovascular  perspective.   Clabe Seal, PA-C 11/17/2018 8:26 AM

## 2018-11-17 NOTE — Care Management Important Message (Signed)
Copy of signed IM left with patient in room.  

## 2018-11-17 NOTE — Discharge Summary (Addendum)
[facility] [place] NAME: Mike Decker    MR#:  [id number]  DATE OF BIRTH:  [birth year]  DATE OF ADMISSION:  11/14/2018 ADMITTING PHYSICIAN: Amelia Jo, MD  DATE OF DISCHARGE: 11/17/2018  PRIMARY CARE PHYSICIAN: Cletis Athens, MD    ADMISSION DIAGNOSIS:  Chest pain, unspecified type [R07.9]  DISCHARGE DIAGNOSIS:  Active Problems:   Chest pain   SECONDARY DIAGNOSIS:   Past Medical History:  Diagnosis Date  . Arthritis   . Atrioventricular canal (AVC)    irregular heart beats  . Barrett esophagus   . Cancer [facility]) 2002   prostate  . Colon polyp   . Diabetes mellitus without complication ([place])   . Diverticulosis   . Gout   . Heart disease   . Hemangioma    liver  . Hyperlipidemia   . Hypertension   . Myocardial infarct ([place])   . Ocular hypertension   . Peripheral vascular disease ([place])   . Skin cancer   . Sleep apnea   . Vitreoretinal degeneration     HOSPITAL COURSE:    77 year old male with history of diabetes, chronic kidney disease stage III prostate cancer who presents to the ER due to shortness of breath and chest pain.  1.  Non-STEMI: Troponin max is 6.  Troponins have trended down.   Due to kidney issues he is not a candidate for cardiac catheterization.  He is chest pain-free.  Patient was eval by cardiology.  Patient will continue Plavix, isosorbide, losartan, metoprolol, statin Patient scheduled for stress MRI next week at [facility] Will follow-up with Dr. Clayborn Bigness as well. Cardiac rehab upon discharge  2.  Acute on chronic kidney disease stage III: He will continue ARB and Lasix as per Dr. Juleen China.Marland Kitchen  His baseline creatinine is 2.3.     3.  Essential hypertension: Continue metoprolol, losartan, isosorbide  4.  Diabetes: Continue Lantus  and ADA diet  5.  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  Marland Kitchen  He is referred to CHF clinic  upon discharge.  6.  Hyperkalemia: Treated with dextrose and insulin  7.  Anemia of chronic disease: HemoGlobin remained stable  DISCHARGE CONDITIONS AND DIET:   stAble for discharge on heart healthy diabetic diet  CONSULTS OBTAINED:  Treatment Team:  Isaias Cowman, MD Anthonette Legato, MD  DRUG ALLERGIES:   Allergies  Allergen Reactions  . Gabapentin     Other reaction(s): Other (See Comments) Tremors  . Peanut-Containing Drug Products Anaphylaxis  . Penicillins Hives and Rash    Has patient had a PCN reaction causing immediate rash, facial/tongue/throat swelling, SOB or lightheadedness with hypotension: Yes Has patient had a PCN reaction causing severe rash involving mucus membranes or skin necrosis: No Has patient had a PCN reaction that required hospitalization No Has patient had a PCN reaction occurring within the last 10 years: No If all of the above answers are "NO", then may proceed with Cephalosporin use.  . Bee Venom Swelling  . Influenza Vaccines Hives  . Inh [Isoniazid] Hives  . Kenalog [Triamcinolone Acetonide] Hives  . Levaquin [Levofloxacin] Other (See Comments)    Tendon, ligament pain.   . Nalfon [Fenoprofen Calcium] Hives  . Naproxen   . Peanut Oil   . Nsaids Rash    Nalfon 600 Nalfon 600    DISCHARGE MEDICATIONS:   Allergies as of 11/17/2018      Reactions   Gabapentin  Other reaction(s): Other (See Comments) Tremors   Peanut-containing Drug Products Anaphylaxis   Penicillins Hives, Rash   Has patient had a PCN reaction causing immediate rash, facial/tongue/throat swelling, SOB or lightheadedness with hypotension: Yes Has patient had a PCN reaction causing severe rash involving mucus membranes or skin necrosis: No Has patient had a PCN reaction that required hospitalization No Has patient had a PCN reaction occurring within the last 10 years: No If all of the above answers are "NO", then may proceed with Cephalosporin use.   Bee  Venom Swelling   Influenza Vaccines Hives   Inh [isoniazid] Hives   Kenalog [triamcinolone Acetonide] Hives   Levaquin [levofloxacin] Other (See Comments)   Tendon, ligament pain.    Nalfon [fenoprofen Calcium] Hives   Naproxen    Peanut Oil    Nsaids Rash   Nalfon 600 Nalfon 600      Medication List    STOP taking these medications   levofloxacin 500 MG tablet Commonly known as:  LEVAQUIN   methylPREDNISolone 4 MG Tbpk tablet Commonly known as:  MEDROL DOSEPAK   oxyCODONE-acetaminophen 5-325 MG tablet Commonly known as:  PERCOCET/ROXICET     TAKE these medications   acetaminophen 500 MG tablet Commonly known as:  TYLENOL Take 1,000 mg by mouth every 6 (six) hours as needed for mild pain, moderate pain or headache.   allopurinol 100 MG tablet Commonly known as:  ZYLOPRIM Take 200 mg by mouth 2 (two) times daily.   amLODipine 5 MG tablet Commonly known as:  NORVASC TAKE 1 TABLET (5 MG TOTAL) BY MOUTH 2 (TWO) TIMES DAILY.   aspirin 81 MG chewable tablet Chew 1 tablet (81 mg total) by mouth daily.   budesonide-formoterol 160-4.5 MCG/ACT inhaler Commonly known as:  SYMBICORT Inhale 2 puffs into the lungs 2 (two) times daily.   CARAFATE 1 GM/10ML suspension Generic drug:  sucralfate TAKE 2 TEASPOONSFUL (10MLS) BY MOUTH 4 TIMES A DAY BEFORE MEALS AND NIGHTLY FOR 14 DAYS   cetirizine 5 MG tablet Commonly known as:  ZYRTEC Take 5 mg by mouth daily as needed for allergies or rhinitis.   chlorpheniramine-HYDROcodone 10-8 MG/5ML Suer Commonly known as:  TUSSIONEX TAKE 1/2 TEASPOONFUL BY MOUTH TWICE A DAY   citalopram 10 MG tablet Commonly known as:  CELEXA Take 10 mg by mouth daily.   clopidogrel 75 MG tablet Commonly known as:  PLAVIX Take 1 tablet (75 mg total) by mouth daily.   colchicine 0.6 MG tablet Take 1 tablet (0.6 mg total) by mouth daily. What changed:  when to take this   COMPOUND W COMPLETE Kit Take 15 mL three times daily before meals for the  first 2 days following ablation treatment and then every 4 hours ONLY as needed thereafter.   dextromethorphan-guaiFENesin 30-600 MG 12hr tablet Commonly known as:  MUCINEX DM Take 1 tablet by mouth 2 (two) times daily as needed for cough.   EPINEPHrine 0.3 mg/0.3 mL Soaj injection Commonly known as:  EPI-PEN Inject into the muscle as needed (anaphylaxis).   Fish Oil 1000 MG Caps Take 1 capsule by mouth daily.   fluticasone 50 MCG/ACT nasal spray Commonly known as:  FLONASE Place 2 sprays into both nostrils daily.   furosemide 20 MG tablet Commonly known as:  LASIX Take 2 tablets (40 mg total) by mouth daily.   gabapentin 100 MG capsule Commonly known as:  NEURONTIN TAKE 1 CAPSULE THREE TIMES DAILY FOR 1 WEEK, THEN INCREASE TO 2 CAPSULES THREE TIMES DAILY  insulin glargine 100 UNIT/ML injection Commonly known as:  LANTUS Inject 62 Units into the skin at bedtime.   ipratropium-albuterol 0.5-2.5 (3) MG/3ML Soln Commonly known as:  DUONEB Take 3 mLs by nebulization every 4 (four) hours as needed.   isosorbide mononitrate 120 MG 24 hr tablet Commonly known as:  IMDUR Take 1 tablet (120 mg total) by mouth daily.   losartan 25 MG tablet Commonly known as:  COZAAR Take 25 mg by mouth daily.   magnesium gluconate 500 MG tablet Commonly known as:  MAGONATE Take 500 mg by mouth daily.   magnesium oxide 400 MG tablet Commonly known as:  MAG-OX Take 400 mg by mouth daily.   methocarbamol 500 MG tablet Commonly known as:  ROBAXIN Take 500 mg by mouth 2 (two) times daily.   Metoprolol Tartrate 75 MG Tabs Take 50 mg by mouth 2 (two) times daily.   nitroGLYCERIN 0.4 MG SL tablet Commonly known as:  NITROSTAT Place 0.4 mg under the tongue every 5 (five) minutes x 3 doses as needed for chest pain.   NOVOLOG FLEXPEN 100 UNIT/ML FlexPen Generic drug:  insulin aspart Inject 20-32 Units into the skin See admin instructions. Inject 14 units every morning with breakfast, 24  units at lunch and 24 units at dinner, follow sliding scale for the rest of meals.   oxyCODONE 5 MG/5ML solution Commonly known as:  ROXICODONE Take by mouth.   pantoprazole 40 MG tablet Commonly known as:  PROTONIX Take 40 mg by mouth 2 (two) times daily.   polyethylene glycol powder powder Commonly known as:  GLYCOLAX/MIRALAX Use as directed   rosuvastatin 40 MG tablet Commonly known as:  CRESTOR Take 40 mg by mouth daily.   traMADol 50 MG tablet Commonly known as:  ULTRAM Take 50 mg by mouth every 4 (four) hours as needed.   Vitamin D (Ergocalciferol) 1.25 MG (50000 UT) Caps capsule Commonly known as:  DRISDOL TAKE 1 CAPSULE BY MOUTH ONCE A MONTH   Vitamin D3 1.25 MG (50000 UT) Caps Take 50,000 Units by mouth every 30 (thirty) days.         Today   CHIEF COMPLAINT:  Patient is doing well without shortness of breath or chest pain   VITAL SIGNS:  Blood pressure (!) 127/50, pulse (!) 55, temperature 98.2 F (36.8 C), temperature source Oral, resp. rate (!) 24, height '5\' 6"'  (1.676 m), weight 88.2 kg, SpO2 96 %.   REVIEW OF SYSTEMS:  Review of Systems  Constitutional: Negative.  Negative for chills, fever and malaise/fatigue.  HENT: Negative.  Negative for ear discharge, ear pain, hearing loss, nosebleeds and sore throat.   Eyes: Negative.  Negative for blurred vision and pain.  Respiratory: Negative.  Negative for cough, hemoptysis, shortness of breath and wheezing.   Cardiovascular: Negative.  Negative for chest pain, palpitations and leg swelling.  Gastrointestinal: Negative.  Negative for abdominal pain, blood in stool, diarrhea, nausea and vomiting.  Genitourinary: Negative.  Negative for dysuria.  Musculoskeletal: Negative.  Negative for back pain.  Skin: Negative.   Neurological: Negative for dizziness, tremors, speech change, focal weakness, seizures and headaches.  Endo/Heme/Allergies: Negative.  Does not bruise/bleed easily.  Psychiatric/Behavioral:  Negative.  Negative for depression, hallucinations and suicidal ideas.     PHYSICAL EXAMINATION:  GENERAL:  77 y.o.-year-old patient lying in the bed with no acute distress.  NECK:  Supple, no jugular venous distention. No thyroid enlargement, no tenderness.  LUNGS: Normal breath sounds bilaterally, no wheezing, rales,rhonchi  No use of accessory muscles of respiration.  CARDIOVASCULAR: S1, S2 normal. No murmurs, rubs, or gallops.  ABDOMEN: Soft, non-tender, non-distended. Bowel sounds present. No organomegaly or mass.  EXTREMITIES: No pedal edema, cyanosis, or clubbing.  PSYCHIATRIC: The patient is alert and oriented x 3.  SKIN: No obvious rash, lesion, or ulcer.   DATA REVIEW:   CBC Recent Labs  Lab 11/17/18 0343  WBC 9.4  HGB 8.2*  HCT 26.8*  PLT 308    Chemistries  Recent Labs  Lab 11/17/18 0343  NA 136  K 4.2  CL 104  CO2 23  GLUCOSE 219*  BUN 65*  CREATININE 2.35*  CALCIUM 8.4*    Cardiac Enzymes Recent Labs  Lab 11/15/18 1313 11/15/18 1833 11/16/18 0010  TROPONINI 6.06* 5.11* 3.42*    Microbiology Results  '@[id number]' @  RADIOLOGY:  Dg Chest 1 View  Result Date: 11/16/2018 CLINICAL DATA:  History of pneumonia. EXAM: CHEST  1 VIEW COMPARISON:  11/14/2018 FINDINGS: The heart is enlarged but stable. No infiltrates, edema or effusions. The bony thorax is intact. IMPRESSION: No acute cardiopulmonary findings.  Stable cardiac enlargement. Electronically Signed   By: Marijo Sanes M.D.   On: 11/16/2018 13:11      Allergies as of 11/17/2018      Reactions   Gabapentin    Other reaction(s): Other (See Comments) Tremors   Peanut-containing Drug Products Anaphylaxis   Penicillins Hives, Rash   Has patient had a PCN reaction causing immediate rash, facial/tongue/throat swelling, SOB or lightheadedness with hypotension: Yes Has patient had a PCN reaction causing severe rash involving mucus membranes or skin necrosis: No Has patient had a PCN reaction that  required hospitalization No Has patient had a PCN reaction occurring within the last 10 years: No If all of the above answers are "NO", then may proceed with Cephalosporin use.   Bee Venom Swelling   Influenza Vaccines Hives   Inh [isoniazid] Hives   Kenalog [triamcinolone Acetonide] Hives   Levaquin [levofloxacin] Other (See Comments)   Tendon, ligament pain.    Nalfon [fenoprofen Calcium] Hives   Naproxen    Peanut Oil    Nsaids Rash   Nalfon 600 Nalfon 600      Medication List    STOP taking these medications   levofloxacin 500 MG tablet Commonly known as:  LEVAQUIN   methylPREDNISolone 4 MG Tbpk tablet Commonly known as:  MEDROL DOSEPAK   oxyCODONE-acetaminophen 5-325 MG tablet Commonly known as:  PERCOCET/ROXICET     TAKE these medications   acetaminophen 500 MG tablet Commonly known as:  TYLENOL Take 1,000 mg by mouth every 6 (six) hours as needed for mild pain, moderate pain or headache.   allopurinol 100 MG tablet Commonly known as:  ZYLOPRIM Take 200 mg by mouth 2 (two) times daily.   amLODipine 5 MG tablet Commonly known as:  NORVASC TAKE 1 TABLET (5 MG TOTAL) BY MOUTH 2 (TWO) TIMES DAILY.   aspirin 81 MG chewable tablet Chew 1 tablet (81 mg total) by mouth daily.   budesonide-formoterol 160-4.5 MCG/ACT inhaler Commonly known as:  SYMBICORT Inhale 2 puffs into the lungs 2 (two) times daily.   CARAFATE 1 GM/10ML suspension Generic drug:  sucralfate TAKE 2 TEASPOONSFUL (10MLS) BY MOUTH 4 TIMES A DAY BEFORE MEALS AND NIGHTLY FOR 14 DAYS   cetirizine 5 MG tablet Commonly known as:  ZYRTEC Take 5 mg by mouth daily as needed for allergies or rhinitis.   chlorpheniramine-HYDROcodone 10-8 MG/5ML Suer Commonly known  as:  TUSSIONEX TAKE 1/2 TEASPOONFUL BY MOUTH TWICE A DAY   citalopram 10 MG tablet Commonly known as:  CELEXA Take 10 mg by mouth daily.   clopidogrel 75 MG tablet Commonly known as:  PLAVIX Take 1 tablet (75 mg total) by mouth daily.    colchicine 0.6 MG tablet Take 1 tablet (0.6 mg total) by mouth daily. What changed:  when to take this   COMPOUND W COMPLETE Kit Take 15 mL three times daily before meals for the first 2 days following ablation treatment and then every 4 hours ONLY as needed thereafter.   dextromethorphan-guaiFENesin 30-600 MG 12hr tablet Commonly known as:  MUCINEX DM Take 1 tablet by mouth 2 (two) times daily as needed for cough.   EPINEPHrine 0.3 mg/0.3 mL Soaj injection Commonly known as:  EPI-PEN Inject into the muscle as needed (anaphylaxis).   Fish Oil 1000 MG Caps Take 1 capsule by mouth daily.   fluticasone 50 MCG/ACT nasal spray Commonly known as:  FLONASE Place 2 sprays into both nostrils daily.   furosemide 20 MG tablet Commonly known as:  LASIX Take 2 tablets (40 mg total) by mouth daily.   gabapentin 100 MG capsule Commonly known as:  NEURONTIN TAKE 1 CAPSULE THREE TIMES DAILY FOR 1 WEEK, THEN INCREASE TO 2 CAPSULES THREE TIMES DAILY   insulin glargine 100 UNIT/ML injection Commonly known as:  LANTUS Inject 62 Units into the skin at bedtime.   ipratropium-albuterol 0.5-2.5 (3) MG/3ML Soln Commonly known as:  DUONEB Take 3 mLs by nebulization every 4 (four) hours as needed.   isosorbide mononitrate 120 MG 24 hr tablet Commonly known as:  IMDUR Take 1 tablet (120 mg total) by mouth daily.   losartan 25 MG tablet Commonly known as:  COZAAR Take 25 mg by mouth daily.   magnesium gluconate 500 MG tablet Commonly known as:  MAGONATE Take 500 mg by mouth daily.   magnesium oxide 400 MG tablet Commonly known as:  MAG-OX Take 400 mg by mouth daily.   methocarbamol 500 MG tablet Commonly known as:  ROBAXIN Take 500 mg by mouth 2 (two) times daily.   Metoprolol Tartrate 75 MG Tabs Take 50 mg by mouth 2 (two) times daily.   nitroGLYCERIN 0.4 MG SL tablet Commonly known as:  NITROSTAT Place 0.4 mg under the tongue every 5 (five) minutes x 3 doses as needed for chest  pain.   NOVOLOG FLEXPEN 100 UNIT/ML FlexPen Generic drug:  insulin aspart Inject 20-32 Units into the skin See admin instructions. Inject 14 units every morning with breakfast, 24 units at lunch and 24 units at dinner, follow sliding scale for the rest of meals.   oxyCODONE 5 MG/5ML solution Commonly known as:  ROXICODONE Take by mouth.   pantoprazole 40 MG tablet Commonly known as:  PROTONIX Take 40 mg by mouth 2 (two) times daily.   polyethylene glycol powder powder Commonly known as:  GLYCOLAX/MIRALAX Use as directed   rosuvastatin 40 MG tablet Commonly known as:  CRESTOR Take 40 mg by mouth daily.   traMADol 50 MG tablet Commonly known as:  ULTRAM Take 50 mg by mouth every 4 (four) hours as needed.   Vitamin D (Ergocalciferol) 1.25 MG (50000 UT) Caps capsule Commonly known as:  DRISDOL TAKE 1 CAPSULE BY MOUTH ONCE A MONTH   Vitamin D3 1.25 MG (50000 UT) Caps Take 50,000 Units by mouth every 30 (thirty) days.          Management plans discussed  with the patient and he is in agreement. Stable for discharge home  Patient should follow up with pcp  CODE STATUS:     Code Status Orders  (From admission, onward)         Start     Ordered   11/15/18 0048  Full code  Continuous     11/15/18 0047        Code Status History    Date Active Date Inactive Code Status Order ID Comments User Context   05/07/2017 0047 05/15/2017 1850 Full Code [id number]  Harvie Bridge, DO Inpatient   10/08/2015 1648 10/09/2015 1659 Full Code [id number]  Vaughan Basta, MD Inpatient    Advance Directive Documentation     Most Recent Value  Type of Advance Directive  Living will  Pre-existing out of facility DNR order (yellow form or pink MOST form)  -  "MOST" Form in Place?  -      TOTAL TIME TAKING CARE OF THIS PATIENT: 38 minutes.    Note: This dictation was prepared with Dragon dictation along with smaller phrase technology. Any transcriptional errors that result  from this process are unintentional.  Pavle Wiler M.D on 11/17/2018 at 10:30 AM  Between 7am to 6pm - Pager - [phone] After 6pm go to [email or web address] - password [email or web address] [facility]  Office  [phone]  CC: Primary care physician; Cletis Athens, MD

## 2018-11-20 DIAGNOSIS — E1122 Type 2 diabetes mellitus with diabetic chronic kidney disease: Secondary | ICD-10-CM | POA: Diagnosis not present

## 2018-11-20 DIAGNOSIS — R809 Proteinuria, unspecified: Secondary | ICD-10-CM | POA: Diagnosis not present

## 2018-11-20 DIAGNOSIS — N2581 Secondary hyperparathyroidism of renal origin: Secondary | ICD-10-CM | POA: Diagnosis not present

## 2018-11-20 DIAGNOSIS — N183 Chronic kidney disease, stage 3 (moderate): Secondary | ICD-10-CM | POA: Diagnosis not present

## 2018-11-20 DIAGNOSIS — I129 Hypertensive chronic kidney disease with stage 1 through stage 4 chronic kidney disease, or unspecified chronic kidney disease: Secondary | ICD-10-CM | POA: Diagnosis not present

## 2018-11-24 DIAGNOSIS — I2510 Atherosclerotic heart disease of native coronary artery without angina pectoris: Secondary | ICD-10-CM | POA: Diagnosis not present

## 2018-11-24 DIAGNOSIS — I509 Heart failure, unspecified: Secondary | ICD-10-CM | POA: Diagnosis not present

## 2018-11-24 DIAGNOSIS — R0602 Shortness of breath: Secondary | ICD-10-CM | POA: Diagnosis not present

## 2018-11-27 DIAGNOSIS — N182 Chronic kidney disease, stage 2 (mild): Secondary | ICD-10-CM | POA: Diagnosis not present

## 2018-11-27 DIAGNOSIS — I2510 Atherosclerotic heart disease of native coronary artery without angina pectoris: Secondary | ICD-10-CM | POA: Diagnosis not present

## 2018-11-27 DIAGNOSIS — K219 Gastro-esophageal reflux disease without esophagitis: Secondary | ICD-10-CM | POA: Diagnosis not present

## 2018-11-27 DIAGNOSIS — R0602 Shortness of breath: Secondary | ICD-10-CM | POA: Diagnosis not present

## 2018-11-27 DIAGNOSIS — I739 Peripheral vascular disease, unspecified: Secondary | ICD-10-CM | POA: Diagnosis not present

## 2018-11-27 DIAGNOSIS — I208 Other forms of angina pectoris: Secondary | ICD-10-CM | POA: Diagnosis not present

## 2018-11-27 DIAGNOSIS — E669 Obesity, unspecified: Secondary | ICD-10-CM | POA: Diagnosis not present

## 2018-11-27 DIAGNOSIS — E7849 Other hyperlipidemia: Secondary | ICD-10-CM | POA: Diagnosis not present

## 2018-11-27 DIAGNOSIS — I214 Non-ST elevation (NSTEMI) myocardial infarction: Secondary | ICD-10-CM | POA: Diagnosis not present

## 2018-11-27 DIAGNOSIS — I509 Heart failure, unspecified: Secondary | ICD-10-CM | POA: Diagnosis not present

## 2018-11-27 DIAGNOSIS — I10 Essential (primary) hypertension: Secondary | ICD-10-CM | POA: Diagnosis not present

## 2018-11-27 DIAGNOSIS — J329 Chronic sinusitis, unspecified: Secondary | ICD-10-CM | POA: Diagnosis not present

## 2018-12-04 DIAGNOSIS — I214 Non-ST elevation (NSTEMI) myocardial infarction: Secondary | ICD-10-CM | POA: Diagnosis not present

## 2018-12-04 DIAGNOSIS — I2510 Atherosclerotic heart disease of native coronary artery without angina pectoris: Secondary | ICD-10-CM | POA: Diagnosis not present

## 2018-12-05 ENCOUNTER — Emergency Department: Payer: Medicare Other

## 2018-12-05 ENCOUNTER — Encounter: Payer: Self-pay | Admitting: Intensive Care

## 2018-12-05 ENCOUNTER — Other Ambulatory Visit: Payer: Self-pay

## 2018-12-05 ENCOUNTER — Inpatient Hospital Stay
Admission: EM | Admit: 2018-12-05 | Discharge: 2018-12-15 | DRG: 286 | Disposition: A | Discharge status: Home Health | Payer: Medicare Other | Attending: Internal Medicine | Admitting: Internal Medicine

## 2018-12-05 DIAGNOSIS — R079 Chest pain, unspecified: Secondary | ICD-10-CM | POA: Diagnosis not present

## 2018-12-05 DIAGNOSIS — D126 Benign neoplasm of colon, unspecified: Secondary | ICD-10-CM | POA: Diagnosis not present

## 2018-12-05 DIAGNOSIS — R0602 Shortness of breath: Secondary | ICD-10-CM | POA: Diagnosis not present

## 2018-12-05 DIAGNOSIS — I130 Hypertensive heart and chronic kidney disease with heart failure and stage 1 through stage 4 chronic kidney disease, or unspecified chronic kidney disease: Secondary | ICD-10-CM | POA: Diagnosis present

## 2018-12-05 DIAGNOSIS — E1151 Type 2 diabetes mellitus with diabetic peripheral angiopathy without gangrene: Secondary | ICD-10-CM | POA: Diagnosis present

## 2018-12-05 DIAGNOSIS — D122 Benign neoplasm of ascending colon: Secondary | ICD-10-CM | POA: Diagnosis not present

## 2018-12-05 DIAGNOSIS — R0601 Orthopnea: Secondary | ICD-10-CM

## 2018-12-05 DIAGNOSIS — R042 Hemoptysis: Secondary | ICD-10-CM | POA: Diagnosis not present

## 2018-12-05 DIAGNOSIS — Z85828 Personal history of other malignant neoplasm of skin: Secondary | ICD-10-CM

## 2018-12-05 DIAGNOSIS — N184 Chronic kidney disease, stage 4 (severe): Secondary | ICD-10-CM | POA: Diagnosis not present

## 2018-12-05 DIAGNOSIS — I200 Unstable angina: Secondary | ICD-10-CM | POA: Diagnosis not present

## 2018-12-05 DIAGNOSIS — D6832 Hemorrhagic disorder due to extrinsic circulating anticoagulants: Secondary | ICD-10-CM | POA: Diagnosis not present

## 2018-12-05 DIAGNOSIS — Y92230 Patient room in hospital as the place of occurrence of the external cause: Secondary | ICD-10-CM | POA: Diagnosis not present

## 2018-12-05 DIAGNOSIS — R001 Bradycardia, unspecified: Secondary | ICD-10-CM | POA: Diagnosis not present

## 2018-12-05 DIAGNOSIS — Z881 Allergy status to other antibiotic agents status: Secondary | ICD-10-CM

## 2018-12-05 DIAGNOSIS — T45515A Adverse effect of anticoagulants, initial encounter: Secondary | ICD-10-CM | POA: Diagnosis not present

## 2018-12-05 DIAGNOSIS — J90 Pleural effusion, not elsewhere classified: Secondary | ICD-10-CM | POA: Diagnosis not present

## 2018-12-05 DIAGNOSIS — K5790 Diverticulosis of intestine, part unspecified, without perforation or abscess without bleeding: Secondary | ICD-10-CM | POA: Diagnosis not present

## 2018-12-05 DIAGNOSIS — K5730 Diverticulosis of large intestine without perforation or abscess without bleeding: Secondary | ICD-10-CM | POA: Diagnosis not present

## 2018-12-05 DIAGNOSIS — N179 Acute kidney failure, unspecified: Secondary | ICD-10-CM | POA: Diagnosis present

## 2018-12-05 DIAGNOSIS — K2270 Barrett's esophagus without dysplasia: Secondary | ICD-10-CM | POA: Diagnosis present

## 2018-12-05 DIAGNOSIS — D500 Iron deficiency anemia secondary to blood loss (chronic): Secondary | ICD-10-CM | POA: Diagnosis not present

## 2018-12-05 DIAGNOSIS — I5033 Acute on chronic diastolic (congestive) heart failure: Secondary | ICD-10-CM | POA: Diagnosis not present

## 2018-12-05 DIAGNOSIS — Z955 Presence of coronary angioplasty implant and graft: Secondary | ICD-10-CM

## 2018-12-05 DIAGNOSIS — I25110 Atherosclerotic heart disease of native coronary artery with unstable angina pectoris: Secondary | ICD-10-CM | POA: Diagnosis present

## 2018-12-05 DIAGNOSIS — K2100 Gastro-esophageal reflux disease with esophagitis, without bleeding: Secondary | ICD-10-CM

## 2018-12-05 DIAGNOSIS — I110 Hypertensive heart disease with heart failure: Secondary | ICD-10-CM | POA: Diagnosis not present

## 2018-12-05 DIAGNOSIS — Z887 Allergy status to serum and vaccine status: Secondary | ICD-10-CM

## 2018-12-05 DIAGNOSIS — Z961 Presence of intraocular lens: Secondary | ICD-10-CM | POA: Diagnosis present

## 2018-12-05 DIAGNOSIS — I890 Lymphedema, not elsewhere classified: Secondary | ICD-10-CM | POA: Diagnosis not present

## 2018-12-05 DIAGNOSIS — E1122 Type 2 diabetes mellitus with diabetic chronic kidney disease: Secondary | ICD-10-CM | POA: Diagnosis not present

## 2018-12-05 DIAGNOSIS — Q2733 Arteriovenous malformation of digestive system vessel: Secondary | ICD-10-CM

## 2018-12-05 DIAGNOSIS — N183 Chronic kidney disease, stage 3 (moderate): Secondary | ICD-10-CM | POA: Diagnosis not present

## 2018-12-05 DIAGNOSIS — I2510 Atherosclerotic heart disease of native coronary artery without angina pectoris: Secondary | ICD-10-CM | POA: Diagnosis not present

## 2018-12-05 DIAGNOSIS — E860 Dehydration: Secondary | ICD-10-CM | POA: Diagnosis present

## 2018-12-05 DIAGNOSIS — J9601 Acute respiratory failure with hypoxia: Secondary | ICD-10-CM | POA: Diagnosis not present

## 2018-12-05 DIAGNOSIS — I252 Old myocardial infarction: Secondary | ICD-10-CM

## 2018-12-05 DIAGNOSIS — M109 Gout, unspecified: Secondary | ICD-10-CM | POA: Diagnosis present

## 2018-12-05 DIAGNOSIS — Z87891 Personal history of nicotine dependence: Secondary | ICD-10-CM

## 2018-12-05 DIAGNOSIS — K5520 Angiodysplasia of colon without hemorrhage: Secondary | ICD-10-CM | POA: Diagnosis present

## 2018-12-05 DIAGNOSIS — J8410 Pulmonary fibrosis, unspecified: Secondary | ICD-10-CM | POA: Diagnosis present

## 2018-12-05 DIAGNOSIS — K635 Polyp of colon: Secondary | ICD-10-CM | POA: Diagnosis not present

## 2018-12-05 DIAGNOSIS — Z79899 Other long term (current) drug therapy: Secondary | ICD-10-CM

## 2018-12-05 DIAGNOSIS — R809 Proteinuria, unspecified: Secondary | ICD-10-CM | POA: Diagnosis not present

## 2018-12-05 DIAGNOSIS — G4733 Obstructive sleep apnea (adult) (pediatric): Secondary | ICD-10-CM | POA: Diagnosis present

## 2018-12-05 DIAGNOSIS — Z7982 Long term (current) use of aspirin: Secondary | ICD-10-CM

## 2018-12-05 DIAGNOSIS — E785 Hyperlipidemia, unspecified: Secondary | ICD-10-CM | POA: Diagnosis present

## 2018-12-05 DIAGNOSIS — D631 Anemia in chronic kidney disease: Secondary | ICD-10-CM | POA: Diagnosis present

## 2018-12-05 DIAGNOSIS — Z91030 Bee allergy status: Secondary | ICD-10-CM

## 2018-12-05 DIAGNOSIS — Z91010 Allergy to peanuts: Secondary | ICD-10-CM

## 2018-12-05 DIAGNOSIS — K31819 Angiodysplasia of stomach and duodenum without bleeding: Secondary | ICD-10-CM

## 2018-12-05 DIAGNOSIS — I5032 Chronic diastolic (congestive) heart failure: Secondary | ICD-10-CM | POA: Diagnosis not present

## 2018-12-05 DIAGNOSIS — D649 Anemia, unspecified: Secondary | ICD-10-CM

## 2018-12-05 DIAGNOSIS — D125 Benign neoplasm of sigmoid colon: Secondary | ICD-10-CM | POA: Diagnosis present

## 2018-12-05 DIAGNOSIS — Z7951 Long term (current) use of inhaled steroids: Secondary | ICD-10-CM

## 2018-12-05 DIAGNOSIS — K649 Unspecified hemorrhoids: Secondary | ICD-10-CM

## 2018-12-05 DIAGNOSIS — C61 Malignant neoplasm of prostate: Secondary | ICD-10-CM | POA: Diagnosis present

## 2018-12-05 DIAGNOSIS — J449 Chronic obstructive pulmonary disease, unspecified: Secondary | ICD-10-CM | POA: Diagnosis present

## 2018-12-05 DIAGNOSIS — M1990 Unspecified osteoarthritis, unspecified site: Secondary | ICD-10-CM | POA: Diagnosis present

## 2018-12-05 DIAGNOSIS — I214 Non-ST elevation (NSTEMI) myocardial infarction: Secondary | ICD-10-CM | POA: Diagnosis not present

## 2018-12-05 DIAGNOSIS — N189 Chronic kidney disease, unspecified: Secondary | ICD-10-CM | POA: Diagnosis not present

## 2018-12-05 DIAGNOSIS — Z7902 Long term (current) use of antithrombotics/antiplatelets: Secondary | ICD-10-CM

## 2018-12-05 DIAGNOSIS — R918 Other nonspecific abnormal finding of lung field: Secondary | ICD-10-CM | POA: Diagnosis not present

## 2018-12-05 DIAGNOSIS — I10 Essential (primary) hypertension: Secondary | ICD-10-CM | POA: Diagnosis not present

## 2018-12-05 DIAGNOSIS — I129 Hypertensive chronic kidney disease with stage 1 through stage 4 chronic kidney disease, or unspecified chronic kidney disease: Secondary | ICD-10-CM | POA: Diagnosis not present

## 2018-12-05 DIAGNOSIS — Z9849 Cataract extraction status, unspecified eye: Secondary | ICD-10-CM

## 2018-12-05 DIAGNOSIS — R531 Weakness: Secondary | ICD-10-CM | POA: Diagnosis not present

## 2018-12-05 DIAGNOSIS — E669 Obesity, unspecified: Secondary | ICD-10-CM | POA: Diagnosis present

## 2018-12-05 DIAGNOSIS — Z6830 Body mass index (BMI) 30.0-30.9, adult: Secondary | ICD-10-CM

## 2018-12-05 DIAGNOSIS — Z888 Allergy status to other drugs, medicaments and biological substances status: Secondary | ICD-10-CM

## 2018-12-05 DIAGNOSIS — Z87892 Personal history of anaphylaxis: Secondary | ICD-10-CM

## 2018-12-05 DIAGNOSIS — Z880 Allergy status to penicillin: Secondary | ICD-10-CM

## 2018-12-05 DIAGNOSIS — Z794 Long term (current) use of insulin: Secondary | ICD-10-CM

## 2018-12-05 HISTORY — DX: Chronic diastolic (congestive) heart failure: I50.32

## 2018-12-05 LAB — TROPONIN I
Troponin I: <0.03 ng/mL (ref <0.03)
Troponin I: <0.03 ng/mL (ref <0.03)
Troponin I: <0.03 ng/mL (ref <0.03)

## 2018-12-05 LAB — CBC
HCT: 30.9 % — ABNORMAL LOW (ref 39.0–52.0)
HEMOGLOBIN: 9.4 g/dL — AB (ref 13.0–17.0)
MCH: 24.8 pg — ABNORMAL LOW (ref 26.0–34.0)
MCHC: 30.4 g/dL (ref 30.0–36.0)
MCV: 81.5 fL (ref 80.0–100.0)
Platelets: 317 10*3/uL (ref 150–400)
RBC: 3.79 MIL/uL — AB (ref 4.22–5.81)
RDW: 15.4 % (ref 11.5–15.5)
WBC: 7.3 10*3/uL (ref 4.0–10.5)
nRBC: 0 % (ref 0.0–0.2)

## 2018-12-05 LAB — BASIC METABOLIC PANEL
ANION GAP: 10 (ref 5–15)
BUN: 47 mg/dL — ABNORMAL HIGH (ref 8–23)
CO2: 22 mmol/L (ref 22–32)
Calcium: 9.7 mg/dL (ref 8.9–10.3)
Chloride: 106 mmol/L (ref 98–111)
Creatinine, Ser: 2.11 mg/dL — ABNORMAL HIGH (ref 0.61–1.24)
GFR calc Af Amer: 34 mL/min — ABNORMAL LOW (ref >60)
GFR calc non Af Amer: 29 mL/min — ABNORMAL LOW (ref >60)
Glucose, Bld: 78 mg/dL (ref 70–99)
Potassium: 4.3 mmol/L (ref 3.5–5.1)
Sodium: 138 mmol/L (ref 135–145)

## 2018-12-05 LAB — HEPARIN LEVEL (UNFRACTIONATED): HEPARIN UNFRACTIONATED: 0.46 [IU]/mL (ref 0.30–0.70)

## 2018-12-05 LAB — PROTIME-INR
INR: 0.98
Prothrombin Time: 12.9 seconds (ref 11.4–15.2)

## 2018-12-05 LAB — GLUCOSE, CAPILLARY
Glucose-Capillary: 134 mg/dL — ABNORMAL HIGH (ref 70–99)
Glucose-Capillary: 189 mg/dL — ABNORMAL HIGH (ref 70–99)

## 2018-12-05 LAB — APTT: aPTT: 32 seconds (ref 24–36)

## 2018-12-05 MED ORDER — CITALOPRAM HYDROBROMIDE 20 MG PO TABS
10.0000 mg | ORAL_TABLET | Freq: Every day | ORAL | Status: DC
  Administered 2018-12-06 – 2018-12-15 (×9): 10 mg via ORAL
  Filled 2018-12-05 (×9): qty 1

## 2018-12-05 MED ORDER — CLOPIDOGREL BISULFATE 75 MG PO TABS
75.0000 mg | ORAL_TABLET | Freq: Every day | ORAL | Status: DC
  Administered 2018-12-06 – 2018-12-11 (×6): 75 mg via ORAL
  Filled 2018-12-05 (×6): qty 1

## 2018-12-05 MED ORDER — NITROGLYCERIN IN D5W 200-5 MCG/ML-% IV SOLN
0.0000 ug/min | INTRAVENOUS | Status: DC
  Administered 2018-12-05: 40 ug/min via INTRAVENOUS
  Administered 2018-12-05: 45 ug/min via INTRAVENOUS
  Administered 2018-12-06: 40 ug/min via INTRAVENOUS
  Filled 2018-12-05 (×2): qty 250

## 2018-12-05 MED ORDER — METOPROLOL TARTRATE 50 MG PO TABS
75.0000 mg | ORAL_TABLET | Freq: Two times a day (BID) | ORAL | Status: DC
  Administered 2018-12-05 – 2018-12-12 (×15): 75 mg via ORAL
  Filled 2018-12-05 (×16): qty 1

## 2018-12-05 MED ORDER — DOCUSATE SODIUM 100 MG PO CAPS
100.0000 mg | ORAL_CAPSULE | Freq: Two times a day (BID) | ORAL | Status: DC
  Administered 2018-12-06 – 2018-12-15 (×18): 100 mg via ORAL
  Filled 2018-12-05 (×18): qty 1

## 2018-12-05 MED ORDER — HEPARIN BOLUS VIA INFUSION
4000.0000 [IU] | Freq: Once | INTRAVENOUS | Status: AC
  Administered 2018-12-05: 4000 [IU] via INTRAVENOUS
  Filled 2018-12-05: qty 4000

## 2018-12-05 MED ORDER — ASPIRIN 81 MG PO CHEW
324.0000 mg | CHEWABLE_TABLET | Freq: Once | ORAL | Status: AC
  Administered 2018-12-05: 324 mg via ORAL
  Filled 2018-12-05: qty 4

## 2018-12-05 MED ORDER — ROSUVASTATIN CALCIUM 10 MG PO TABS
40.0000 mg | ORAL_TABLET | Freq: Every evening | ORAL | Status: DC
  Administered 2018-12-05 – 2018-12-14 (×9): 40 mg via ORAL
  Filled 2018-12-05 (×2): qty 4
  Filled 2018-12-05: qty 2
  Filled 2018-12-05 (×2): qty 4
  Filled 2018-12-05: qty 2
  Filled 2018-12-05 (×4): qty 4
  Filled 2018-12-05 (×2): qty 2

## 2018-12-05 MED ORDER — ONDANSETRON HCL 4 MG PO TABS: 4.0000 mg | ORAL_TABLET | Freq: Four times a day (QID) | ORAL | Status: DC | PRN

## 2018-12-05 MED ORDER — ACETAMINOPHEN 325 MG PO TABS
650.0000 mg | ORAL_TABLET | Freq: Four times a day (QID) | ORAL | Status: DC | PRN
  Administered 2018-12-06 – 2018-12-11 (×2): 650 mg via ORAL
  Filled 2018-12-05 (×2): qty 2

## 2018-12-05 MED ORDER — ACETAMINOPHEN 650 MG RE SUPP: 650.0000 mg | Freq: Four times a day (QID) | RECTAL | Status: DC | PRN

## 2018-12-05 MED ORDER — FLUTICASONE PROPIONATE 50 MCG/ACT NA SUSP
2.0000 | Freq: Every day | NASAL | Status: DC
  Administered 2018-12-06 – 2018-12-15 (×9): 2 via NASAL
  Filled 2018-12-05: qty 16

## 2018-12-05 MED ORDER — AMLODIPINE BESYLATE 5 MG PO TABS
5.0000 mg | ORAL_TABLET | Freq: Two times a day (BID) | ORAL | Status: DC
  Administered 2018-12-05 – 2018-12-12 (×15): 5 mg via ORAL
  Filled 2018-12-05 (×15): qty 1

## 2018-12-05 MED ORDER — TRAZODONE HCL 50 MG PO TABS
25.0000 mg | ORAL_TABLET | Freq: Every evening | ORAL | Status: DC | PRN
  Administered 2018-12-05: 25 mg via ORAL
  Filled 2018-12-05: qty 1

## 2018-12-05 MED ORDER — INSULIN ASPART 100 UNIT/ML ~~LOC~~ SOLN
0.0000 [IU] | Freq: Three times a day (TID) | SUBCUTANEOUS | Status: DC
  Administered 2018-12-05: 1 [IU] via SUBCUTANEOUS
  Administered 2018-12-06: 2 [IU] via SUBCUTANEOUS
  Administered 2018-12-06 – 2018-12-07 (×3): 3 [IU] via SUBCUTANEOUS
  Administered 2018-12-07: 2 [IU] via SUBCUTANEOUS
  Administered 2018-12-08 – 2018-12-09 (×4): 1 [IU] via SUBCUTANEOUS
  Administered 2018-12-11: 2 [IU] via SUBCUTANEOUS
  Administered 2018-12-12 (×2): 1 [IU] via SUBCUTANEOUS
  Administered 2018-12-14: 3 [IU] via SUBCUTANEOUS
  Administered 2018-12-15: 2 [IU] via SUBCUTANEOUS
  Administered 2018-12-15: 3 [IU] via SUBCUTANEOUS
  Filled 2018-12-05 (×16): qty 1

## 2018-12-05 MED ORDER — COLCHICINE 0.6 MG PO TABS
0.6000 mg | ORAL_TABLET | Freq: Every day | ORAL | Status: DC
  Administered 2018-12-06: 0.6 mg via ORAL
  Filled 2018-12-05: qty 1

## 2018-12-05 MED ORDER — INSULIN GLARGINE 100 UNIT/ML ~~LOC~~ SOLN
62.0000 [IU] | Freq: Every day | SUBCUTANEOUS | Status: DC
  Administered 2018-12-05 – 2018-12-10 (×6): 62 [IU] via SUBCUTANEOUS
  Filled 2018-12-05 (×7): qty 0.62

## 2018-12-05 MED ORDER — NITROGLYCERIN 0.4 MG SL SUBL: 0.4000 mg | SUBLINGUAL_TABLET | SUBLINGUAL | Status: DC | PRN

## 2018-12-05 MED ORDER — MOMETASONE FURO-FORMOTEROL FUM 200-5 MCG/ACT IN AERO
2.0000 | INHALATION_SPRAY | Freq: Two times a day (BID) | RESPIRATORY_TRACT | Status: DC
  Administered 2018-12-05 – 2018-12-15 (×19): 2 via RESPIRATORY_TRACT
  Filled 2018-12-05: qty 8.8

## 2018-12-05 MED ORDER — SODIUM CHLORIDE 0.9 % IV BOLUS
500.0000 mL | Freq: Once | INTRAVENOUS | Status: AC
  Administered 2018-12-05: 500 mL via INTRAVENOUS

## 2018-12-05 MED ORDER — ONDANSETRON HCL 4 MG/2ML IJ SOLN: 4.0000 mg | Freq: Four times a day (QID) | INTRAMUSCULAR | Status: DC | PRN

## 2018-12-05 MED ORDER — BISACODYL 5 MG PO TBEC
5.0000 mg | DELAYED_RELEASE_TABLET | Freq: Every day | ORAL | Status: DC | PRN
  Administered 2018-12-09: 5 mg via ORAL
  Filled 2018-12-05: qty 1

## 2018-12-05 MED ORDER — ASPIRIN 81 MG PO CHEW
81.0000 mg | CHEWABLE_TABLET | Freq: Every day | ORAL | Status: DC
  Administered 2018-12-06 – 2018-12-11 (×6): 81 mg via ORAL
  Filled 2018-12-05 (×6): qty 1

## 2018-12-05 MED ORDER — METHOCARBAMOL 500 MG PO TABS
500.0000 mg | ORAL_TABLET | Freq: Two times a day (BID) | ORAL | Status: DC
  Administered 2018-12-05 – 2018-12-15 (×19): 500 mg via ORAL
  Filled 2018-12-05 (×21): qty 1

## 2018-12-05 MED ORDER — ALLOPURINOL 100 MG PO TABS
200.0000 mg | ORAL_TABLET | Freq: Two times a day (BID) | ORAL | Status: DC
  Administered 2018-12-05 – 2018-12-15 (×19): 200 mg via ORAL
  Filled 2018-12-05 (×21): qty 2

## 2018-12-05 MED ORDER — HEPARIN (PORCINE) 25000 UT/250ML-% IV SOLN
1100.0000 [IU]/h | INTRAVENOUS | Status: DC
  Administered 2018-12-05 – 2018-12-07 (×3): 1000 [IU]/h via INTRAVENOUS
  Filled 2018-12-05 (×4): qty 250

## 2018-12-05 NOTE — Progress Notes (Signed)
Pt arrived on floor. He is alert and oriented x 4. Skin warm and dry.. vss. Still having some 5/10 chest pain. Nitro gtt increased to 45 mcg's. And chest pain subsided. Plan for cardiac cath on Monday.

## 2018-12-05 NOTE — ED Notes (Signed)
Patient SL nitro f/c placed on nitro frip, awaiting heparin drip from pharmacy. Vss, reports cp 5/10 dull type of feeling bet consistent.

## 2018-12-05 NOTE — ED Triage Notes (Addendum)
PAtient reports laying around this AM and started to feel central chest pressure that radiates to left arm around 0845 and took 3 nitro with little relief. A&O x4. 5 out of 10 pain. C/o SOB and feeling heart race with chest pressure. Patient reports being admitted at beginning of December 2019 for CP and had an echo done. Stress test was completed yesterday. Takes plavix daily

## 2018-12-05 NOTE — Progress Notes (Addendum)
ANTICOAGULATION CONSULT NOTE - Initial Consult  Pharmacy Consult for heparin Indication: chest pain/ACS  Allergies  Allergen Reactions  . Gabapentin     Other reaction(s): Other (See Comments) Tremors  . Peanut-Containing Drug Products Anaphylaxis  . Penicillins Hives and Rash    Has patient had a PCN reaction causing immediate rash, facial/tongue/throat swelling, SOB or lightheadedness with hypotension: Yes Has patient had a PCN reaction causing severe rash involving mucus membranes or skin necrosis: No Has patient had a PCN reaction that required hospitalization No Has patient had a PCN reaction occurring within the last 10 years: No If all of the above answers are "NO", then may proceed with Cephalosporin use.  . Bee Venom Swelling  . Influenza Vaccines Hives  . Inh [Isoniazid] Hives  . Kenalog [Triamcinolone Acetonide] Hives  . Levaquin [Levofloxacin] Other (See Comments)    Tendon, ligament pain.   . Nalfon [Fenoprofen Calcium] Hives  . Naproxen   . Peanut Oil   . Nsaids Rash    Nalfon 600 Nalfon 600    Patient Measurements: Height: 5\' 6"  (167.6 cm) Weight: 190 lb (86.2 kg) IBW/kg (Calculated) : 63.8 Heparin Dosing Weight: 81.7 kg  Vital Signs: Temp: 98 F (36.7 C) (12/27 1029) Temp Source: Oral (12/27 1029) BP: 156/63 (12/27 1029) Pulse Rate: 58 (12/27 1029)  Labs: Recent Labs    12/05/18 1036  HGB 9.4*  HCT 30.9*  PLT 317  LABPROT 12.9  INR 0.98  CREATININE 2.11*  TROPONINI <0.03    Estimated Creatinine Clearance: 29.7 mL/min (A) (by C-G formula based on SCr of 2.11 mg/dL (H)).   Medical History: Past Medical History:  Diagnosis Date  . Arthritis   . Atrioventricular canal (AVC)    irregular heart beats  . Barrett esophagus   . Cancer [facility]) 2002   prostate  . Colon polyp   . Diabetes mellitus without complication ([place])   . Diverticulosis   . Gout   . Heart disease   . Hemangioma    liver  . Hyperlipidemia   . Hypertension   .  Myocardial infarct ([place])   . Ocular hypertension   . Peripheral vascular disease ([place])   . Skin cancer   . Sleep apnea   . Vitreoretinal degeneration     Medications: as ordered  Assessment: 77 yom cc chest pain with PMH HTN, HLD, CAD on DAPT, PVD. PTA is not completed at this time. CBC, BMP, PT/INR are ordered, resulted and appropriate. Pharmacy has been consulted to dose heparin for ACS.   Goal of Therapy:  Heparin level 0.3-0.7 units/ml Monitor platelets by anticoagulation protocol: Yes   Plan: Add-on aPTT to complete necessary baseline labs. Follow up on prior to admission med list - uncertain if patient is taking anticoagulant prior to admission but based on INR VKA is not a concern. I did review the OP cardiology OV from 11/27/18 with Dr. Clayborn Bigness and did not see an anticoagulant in those notes.   Give 4000 units bolus x 1 Start heparin infusion at 1000 units/hr Check anti-Xa level in 8 hours and daily while on heparin Continue to monitor H&H and platelets  14:36 ADDENDUM - PTA completed and contains no anticoagulants are noted. APTT returned 32. Continue heparin as ordered.   Laural Benes, PharmD, BCPS Clinical Pharmacist 12/05/2018,12:40 PM

## 2018-12-05 NOTE — ED Notes (Signed)
report given to receiving nurse, will send someone down to pick patient up.

## 2018-12-05 NOTE — ED Notes (Signed)
First Nurse Note: Patient removed from vehicle complaining of chest pain.  To triage for EKG.

## 2018-12-05 NOTE — ED Notes (Signed)
ED TO INPATIENT HANDOFF REPORT  Name/Age/Gender Mike Decker 77 y.o. male  Code Status    Code Status Orders  (From admission, onward)         Start     Ordered   12/05/18 1411  Full code  Continuous     12/05/18 1415        Code Status History    Date Active Date Inactive Code Status Order ID Comments User Context   11/15/2018 0047 11/17/2018 1747 Full Code [id number]  Amelia Jo, MD Inpatient   05/07/2017 0047 05/15/2017 1850 Full Code [id number]  Harvie Bridge, DO Inpatient   10/08/2015 1648 10/09/2015 1659 Full Code [id number]  Vaughan Basta, MD Inpatient    Advance Directive Documentation     Most Recent Value  Type of Advance Directive  Living will  Pre-existing out of facility DNR order (yellow form or pink MOST form)  -  "MOST" Form in Place?  -      Home/SNF/Other Patient from home   Chief Complaint chest pain  Level of Care/Admitting Diagnosis ED Disposition    ED Disposition Condition [place]: [place] [[id number]]  Level of Care: Telemetry [5]  Diagnosis: NSTEMI (non-ST elevated myocardial infarction) [facility]) [[id number]]  Admitting Physician: Epifanio Lesches [[id number]]  Attending Physician: Epifanio Lesches [[id number]]  Estimated length of stay: past midnight tomorrow  Certification:: I certify this patient will need inpatient services for at least 2 midnights  PT Class (Do Not Modify): Inpatient [101]  PT Acc Code (Do Not Modify): Private [1]       Medical History Past Medical History:  Diagnosis Date  . Arthritis   . Atrioventricular canal (AVC)    irregular heart beats  . Barrett esophagus   . Cancer [facility]) 2002   prostate  . Colon polyp   . Diabetes mellitus without complication ([place])   . Diverticulosis   . Gout   . Heart disease   . Hemangioma    liver  . Hyperlipidemia   . Hypertension   . Myocardial infarct ([place])   . Ocular hypertension   . Peripheral vascular disease  ([place])   . Skin cancer   . Sleep apnea   . Vitreoretinal degeneration     Allergies Allergies  Allergen Reactions  . Gabapentin     Other reaction(s): Other (See Comments) Tremors  . Peanut-Containing Drug Products Anaphylaxis  . Penicillins Hives and Rash    Has patient had a PCN reaction causing immediate rash, facial/tongue/throat swelling, SOB or lightheadedness with hypotension: Yes Has patient had a PCN reaction causing severe rash involving mucus membranes or skin necrosis: No Has patient had a PCN reaction that required hospitalization No Has patient had a PCN reaction occurring within the last 10 years: No If all of the above answers are "NO", then may proceed with Cephalosporin use.  . Bee Venom Swelling  . Influenza Vaccines Hives  . Inh [Isoniazid] Hives  . Kenalog [Triamcinolone Acetonide] Hives  . Levaquin [Levofloxacin] Other (See Comments)    Tendon, ligament pain.   . Nalfon [Fenoprofen Calcium] Hives  . Naproxen   . Peanut Oil   . Nsaids Rash    Nalfon 600 Nalfon 600    IV Location/Drains/Wounds Patient Lines/Drains/Airways Status   Active Line/Drains/Airways    Name:   Placement date:   Placement time:   Site:   Days:   Peripheral IV 12/05/18 Right Antecubital   12/05/18    1223  Antecubital   less than 1   Peripheral IV 12/05/18 Right Wrist   12/05/18    1329    Wrist   less than 1          Labs/Imaging Results for orders placed or performed during the hospital encounter of 12/05/18 (from the past 48 hour(s))  Basic metabolic panel     Status: Abnormal   Collection Time: 12/05/18 10:36 AM  Result Value Ref Range   Sodium 138 135 - 145 mmol/L   Potassium 4.3 3.5 - 5.1 mmol/L   Chloride 106 98 - 111 mmol/L   CO2 22 22 - 32 mmol/L   Glucose, Bld 78 70 - 99 mg/dL   BUN 47 (H) 8 - 23 mg/dL   Creatinine, Ser 2.11 (H) 0.61 - 1.24 mg/dL   Calcium 9.7 8.9 - 10.3 mg/dL   GFR calc non Af Amer 29 (L) >60 mL/min   GFR calc Af Amer 34 (L) >60 mL/min    Anion gap 10 5 - 15    Comment: Performed at [facility], [address] [place], [place] [address]  CBC     Status: Abnormal   Collection Time: 12/05/18 10:36 AM  Result Value Ref Range   WBC 7.3 4.0 - 10.5 K/uL   RBC 3.79 (L) 4.22 - 5.81 MIL/uL   Hemoglobin 9.4 (L) 13.0 - 17.0 g/dL   HCT 30.9 (L) 39.0 - 52.0 %   MCV 81.5 80.0 - 100.0 fL   MCH 24.8 (L) 26.0 - 34.0 pg   MCHC 30.4 30.0 - 36.0 g/dL   RDW 15.4 11.5 - 15.5 %   Platelets 317 150 - 400 K/uL   nRBC 0.0 0.0 - 0.2 %    Comment: Performed at [facility], [address] [place], [place] [address]  Troponin I - ONCE - STAT     Status: None   Collection Time: 12/05/18 10:36 AM  Result Value Ref Range   Troponin I <0.03 <0.03 ng/mL    Comment: Performed at [facility], [address] [place], [place] [address]  Protime-INR (order if Patient is taking Coumadin / Warfarin)     Status: None   Collection Time: 12/05/18 10:36 AM  Result Value Ref Range   Prothrombin Time 12.9 11.4 - 15.2 seconds   INR 0.98     Comment: Performed at [facility], [address] [place], [place] [address]  APTT     Status: None   Collection Time: 12/05/18 10:36 AM  Result Value Ref Range   aPTT 32 24 - 36 seconds    Comment: Performed at [facility], [address] [place], [place] [address]   Dg Chest 2 View  Result Date: 12/05/2018 CLINICAL DATA:  Chest pain began this morning EXAM: CHEST - 2 VIEW COMPARISON:  Chest x-ray of 11/16/2017 FINDINGS: No active infiltrate or effusion is seen. Mediastinal and hilar contours appear stable. Cardiomegaly is stable. No acute bony abnormality is seen. Coronary artery stent is noted. IMPRESSION: Stable cardiomegaly.  No active lung disease. Electronically Signed   By: Ivar Drape M.D.   On: 12/05/2018 10:47    Pending Labs Unresulted Labs (From admission, onward)    Start     Ordered   12/06/18 9798  Basic metabolic panel  Tomorrow morning,   STAT      12/05/18 1415   12/06/18 0500  CBC  Tomorrow morning,   STAT     12/05/18 1415  12/05/18 2100  Heparin level (unfractionated)  Once-Timed,   STAT     12/05/18 1239   12/05/18 1430  Troponin I -  Once,   STAT     12/05/18 1430   12/05/18 1412  Troponin I - Now Then Q6H  Now then every 6 hours,   STAT     12/05/18 1415          Vitals/Pain Today's Vitals   12/05/18 1029 12/05/18 1230 12/05/18 1245 12/05/18 1300  BP: (!) 156/63 (!) 148/56  (!) 156/72  Pulse: (!) 58 (!) 51 (!) 52 (!) 57  Resp: 18 14 19 15   Temp: 98 F (36.7 C)     TempSrc: Oral     SpO2: 100% 100% 100% 99%  Weight: 86.2 kg     Height: 5\' 6"  (1.676 m)     PainSc: 5        Isolation Precautions No active isolations  Medications Medications  nitroGLYCERIN 50 mg in dextrose 5 % 250 mL (0.2 mg/mL) infusion (40 mcg/min Intravenous New Bag/Given 12/05/18 1244)  heparin ADULT infusion 100 units/mL (25000 units/249mL sodium chloride 0.45%) (1,000 Units/hr Intravenous New Bag/Given 12/05/18 1330)  allopurinol (ZYLOPRIM) tablet 200 mg (has no administration in time range)  aspirin chewable tablet 81 mg (has no administration in time range)  colchicine tablet 0.6 mg (has no administration in time range)  amLODipine (NORVASC) tablet 5 mg (has no administration in time range)  citalopram (CELEXA) tablet 10 mg (has no administration in time range)  rosuvastatin (CRESTOR) tablet 40 mg (has no administration in time range)  metoprolol tartrate (LOPRESSOR) tablet 75 mg (has no administration in time range)  mometasone-formoterol (DULERA) 200-5 MCG/ACT inhaler 2 puff (has no administration in time range)  methocarbamol (ROBAXIN) tablet 500 mg (has no administration in time range)  clopidogrel (PLAVIX) tablet 75 mg (has no administration in time range)  insulin glargine (LANTUS) injection 62 Units (has no administration in time range)  fluticasone (FLONASE) 50 MCG/ACT nasal spray 2 spray (has no administration in time  range)  acetaminophen (TYLENOL) tablet 650 mg (has no administration in time range)    Or  acetaminophen (TYLENOL) suppository 650 mg (has no administration in time range)  traZODone (DESYREL) tablet 25 mg (has no administration in time range)  docusate sodium (COLACE) capsule 100 mg (has no administration in time range)  bisacodyl (DULCOLAX) EC tablet 5 mg (has no administration in time range)  ondansetron (ZOFRAN) tablet 4 mg (has no administration in time range)    Or  ondansetron (ZOFRAN) injection 4 mg (has no administration in time range)  aspirin chewable tablet 324 mg (324 mg Oral Given 12/05/18 1239)  sodium chloride 0.9 % bolus 500 mL (0 mLs Intravenous Stopped 12/05/18 1501)  heparin bolus via infusion 4,000 Units (4,000 Units Intravenous Bolus from Bag 12/05/18 1332)    Mobility stretcher

## 2018-12-05 NOTE — ED Provider Notes (Signed)
[facility] Emergency Department Provider Note  ____________________________________________  Time seen: Approximately 12:35 PM  I have reviewed the triage vital signs and the nursing notes.   HISTORY  Chief Complaint Chest Pain   HPI Mike Decker is a 77 y.o. male with a history of hypertension, hyperlipidemia, native coronary artery disease on Plavix and aspirin, peripheral vascular disease who presents for evaluation of chest pain.  Patient reports that he was laying down when he developed sudden onset of severe central chest pressure radiating down his left arm associated with shortness of breath and dizziness.  No nausea or diaphoresis.  Patient reports taking 3 nitroglycerin.  The pain lasted 45 minutes and was 8 out of 10 intensity.  At this time he endorses mild discomfort in the center of his chest and paresthesias of his left arm.  Patient is currently being evaluated by Dr. Clayborn Bigness after being admitted to the hospital several weeks ago with an NSTEMI.  He did not undergo left heart catheterization due to concerns for chronic kidney disease.  Patient had a stress test done yesterday.   Past Medical History:  Diagnosis Date  . Arthritis   . Atrioventricular canal (AVC)    irregular heart beats  . Barrett esophagus   . 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 Colon polyp   . Diabetes mellitus without complication ([place])   . Diverticulosis   . Gout   . Heart disease   . Hemangioma    liver  . Hyperlipidemia   . Hypertension   . Myocardial infarct ([place])   . Ocular hypertension   . Peripheral vascular disease ([place])   . Skin cancer   . Sleep apnea   . Vitreoretinal degeneration     Patient Active Problem List   Diagnosis Date Noted  . Chest pain 11/14/2018  . CAD (coronary artery disease) 02/27/2018  . Sacroiliac joint pain 10/08/2017  . Pain in limb 09/24/2017  . Neuropathy 07/24/2017  . NSTEMI (non-ST elevated myocardial infarction) ([place])  05/06/2017  . PAD (peripheral artery disease) ([place]) 05/01/2017  . History of stroke 01/02/2017  . Barrett's esophagus 10/02/2016  . Carotid stenosis 10/02/2016  . Essential hypertension 10/02/2016  . Hyperlipidemia 10/02/2016  . Diabetes ([place]) 10/02/2016  . 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) 07/24/2016  . Uncontrolled type 2 diabetes mellitus with hyperglycemia, with long-term current use of insulin ([place]) 04/10/2016  . TIA (transient ischemic attack) 10/08/2015    Past Surgical History:  Procedure Laterality Date  . CATARACT EXTRACTION  2011, 2012  . CORONARY ANGIOPLASTY WITH STENT PLACEMENT  2012  . ESOPHAGOGASTRODUODENOSCOPY (EGD) WITH PROPOFOL N/A 06/01/2016   Procedure: ESOPHAGOGASTRODUODENOSCOPY (EGD) WITH PROPOFOL;  Surgeon: Manya Silvas, MD;  Location: [facility] ENDOSCOPY;  Service: Endoscopy;  Laterality: N/A;  . HERNIA REPAIR     x2  . INTRAOCULAR LENS INSERTION    . LEFT HEART CATH AND CORONARY ANGIOGRAPHY N/A 05/09/2017   Procedure: Left Heart Cath and Coronary Angiography;  Surgeon: Yolonda Kida, MD;  Location: [facility];  Service: Cardiovascular;  Laterality: N/A;  . NOSE SURGERY     submucous resection  . PROSTATE SURGERY  2002  . ROTATOR CUFF REPAIR Right     Prior to Admission medications   Medication Sig Start Date End Date Taking? Authorizing Provider  acetaminophen (TYLENOL) 500 MG tablet Take 1,000 mg by mouth every 6 (six) hours as needed for mild pain, moderate pain or headache.  [provider]  allopurinol (ZYLOPRIM) 100 MG tablet Take 200 mg by mouth 2 (two) times daily.     [provider]  amLODipine (NORVASC) 5 MG tablet TAKE 1 TABLET (5 MG TOTAL) BY MOUTH 2 (TWO) TIMES DAILY. 04/02/17   [provider]  aspirin (ASPIRIN CHILDRENS) 81 MG chewable tablet Chew 1 tablet (81 mg total) by mouth daily. 10/09/15   Bettey Costa, MD  budesonide-formoterol (SYMBICORT) 160-4.5 MCG/ACT inhaler Inhale 2 puffs  into the lungs 2 (two) times daily.    [provider]  CARAFATE 1 GM/10ML suspension TAKE 2 TEASPOONSFUL (10MLS) BY MOUTH 4 TIMES A DAY BEFORE MEALS AND NIGHTLY FOR 14 DAYS 03/17/17   [provider]  cetirizine (ZYRTEC) 5 MG tablet Take 5 mg by mouth daily as needed for allergies or rhinitis.     [provider]  chlorpheniramine-HYDROcodone (TUSSIONEX) 10-8 MG/5ML SUER TAKE 1/2 TEASPOONFUL BY MOUTH TWICE A DAY 08/05/17   [provider]  Cholecalciferol (VITAMIN D3) 50000 UNITS CAPS Take 50,000 Units by mouth every 30 (thirty) days.    [provider]  citalopram (CELEXA) 10 MG tablet Take 10 mg by mouth daily.    [provider]  clopidogrel (PLAVIX) 75 MG tablet Take 1 tablet (75 mg total) by mouth daily. 10/09/15   Bettey Costa, MD  colchicine 0.6 MG tablet Take 1 tablet (0.6 mg total) by mouth daily. 11/17/18   Bettey Costa, MD  dextromethorphan-guaiFENesin (MUCINEX DM) 30-600 MG 12hr tablet Take 1 tablet by mouth 2 (two) times daily as needed for cough.    [provider]  Dimethyl Ether (COMPOUND W COMPLETE) KIT Take 15 mL three times daily before meals for the first 2 days following ablation treatment and then every 4 hours ONLY as needed thereafter. 08/29/17   [provider]  EPINEPHrine (EPI-PEN) 0.3 mg/0.3 mL SOAJ injection Inject into the muscle as needed (anaphylaxis).     [provider]  fluticasone (FLONASE) 50 MCG/ACT nasal spray Place 2 sprays into both nostrils daily.  11/04/18   [provider]  furosemide (LASIX) 20 MG tablet Take 2 tablets (40 mg total) by mouth daily. 11/17/18   Bettey Costa, MD  gabapentin (NEURONTIN) 100 MG capsule TAKE 1 CAPSULE THREE TIMES DAILY FOR 1 WEEK, THEN INCREASE TO 2 CAPSULES THREE TIMES DAILY 07/24/17   [provider]  insulin aspart (NOVOLOG FLEXPEN) 100 UNIT/ML FlexPen Inject 20-32 Units into the skin See admin instructions. Inject 14 units every morning  with breakfast, 24 units at lunch and 24 units at dinner, follow sliding scale for the rest of meals. 08/30/15   [provider]  insulin glargine (LANTUS) 100 UNIT/ML injection Inject 62 Units into the skin at bedtime.     [provider]  ipratropium-albuterol (DUONEB) 0.5-2.5 (3) MG/3ML SOLN Take 3 mLs by nebulization every 4 (four) hours as needed. Patient not taking: Reported on 11/14/2018 05/15/17   Hillary Bow, MD  isosorbide mononitrate (IMDUR) 120 MG 24 hr tablet Take 1 tablet (120 mg total) by mouth daily. 10/09/15   Bettey Costa, MD  losartan (COZAAR) 25 MG tablet Take 25 mg by mouth daily. 10/23/18   [provider]  magnesium gluconate (MAGONATE) 500 MG tablet Take 500 mg by mouth daily.    [provider]  magnesium oxide (MAG-OX) 400 MG tablet Take 400 mg by mouth daily.    [provider]  methocarbamol (ROBAXIN) 500 MG tablet Take 500 mg by mouth 2 (two)  times daily.  11/08/18   [provider]  Metoprolol Tartrate 75 MG TABS Take 50 mg by mouth 2 (two) times daily. 11/17/18   Bettey Costa, MD  nitroGLYCERIN (NITROSTAT) 0.4 MG SL tablet Place 0.4 mg under the tongue every 5 (five) minutes x 3 doses as needed for chest pain.     [provider]  Omega-3 Fatty Acids (FISH OIL) 1000 MG CAPS Take 1 capsule by mouth daily.     [provider]  oxyCODONE (ROXICODONE) 5 MG/5ML solution Take by mouth. 08/29/17   [provider]  pantoprazole (PROTONIX) 40 MG tablet Take 40 mg by mouth 2 (two) times daily.    [provider]  polyethylene glycol powder (GLYCOLAX/MIRALAX) powder Use as directed 01/20/15   [provider]  rosuvastatin (CRESTOR) 40 MG tablet Take 40 mg by mouth daily.    [provider]  traMADol (ULTRAM) 50 MG tablet Take 50 mg by mouth every 4 (four) hours as needed.    [provider]  Vitamin D, Ergocalciferol, (DRISDOL) 50000 units CAPS capsule TAKE 1 CAPSULE BY  MOUTH ONCE A MONTH 07/30/17   [provider]    Allergies Gabapentin; Peanut-containing drug products; Penicillins; Bee venom; Influenza vaccines; Inh [isoniazid]; Kenalog [triamcinolone acetonide]; Levaquin [levofloxacin]; Nalfon [fenoprofen calcium]; Naproxen; Peanut oil; and Nsaids  Family History  Problem Relation Age of Onset  . Stroke Maternal Grandfather     Social History Social History   Tobacco Use  . Smoking status: Former Smoker    Years: 20.00    Types: Cigarettes    Last attempt to quit: 12/10/1976    Years since quitting: 42.0  . Smokeless tobacco: Never Used  Substance Use Topics  . Alcohol use: No  . Drug use: No    Review of Systems  Constitutional: Negative for fever. Eyes: Negative for visual changes. ENT: Negative for sore throat. Neck: No neck pain  Cardiovascular: + chest pain. Respiratory: Negative for shortness of breath. Gastrointestinal: Negative for abdominal pain, vomiting or diarrhea. Genitourinary: Negative for dysuria. Musculoskeletal: Negative for back pain. Skin: Negative for rash. Neurological: Negative for headaches, weakness or numbness. Psych: No SI or HI  ____________________________________________   PHYSICAL EXAM:  VITAL SIGNS: ED Triage Vitals [12/05/18 1029]  Enc Vitals Group     BP (!) 156/63     Pulse Rate (!) 58     Resp 18     Temp 98 F (36.7 C)     Temp Source Oral     SpO2 100 %     Weight 190 lb (86.2 kg)     Height _0  (1.676 m)     Head Circumference      Peak Flow      Pain Score 5     Pain Loc      Pain Edu?      Excl. in [place]?     Constitutional: Alert and oriented. Well appearing and in no apparent distress. HEENT:      Head: Normocephalic and atraumatic.         Eyes: Conjunctivae are normal. Sclera is non-icteric.       Mouth/Throat: Mucous membranes are moist.       Neck: Supple with no signs of meningismus. Cardiovascular: Regular rate and rhythm. No murmurs, gallops, or rubs.  2+ symmetrical distal pulses are present in all extremities. No JVD. Respiratory: Normal respiratory effort. Lungs are clear to auscultation bilaterally. No wheezes, crackles, or rhonchi.  Gastrointestinal: Soft,  non tender, and non distended with positive bowel sounds. No rebound or guarding. Musculoskeletal: Nontender with normal range of motion in all extremities. No edema, cyanosis, or erythema of extremities. Neurologic: Normal speech and language. Face is symmetric. Moving all extremities. No gross focal neurologic deficits are appreciated. Skin: Skin is warm, dry and intact. No rash noted. Psychiatric: Mood and affect are normal. Speech and behavior are normal.  ____________________________________________   LABS (all labs ordered are listed, but only abnormal results are displayed)  Labs Reviewed  BASIC METABOLIC PANEL - Abnormal; Notable for the following components:      Result Value   BUN 47 (*)    Creatinine, Ser 2.11 (*)    GFR calc non Af Amer 29 (*)    GFR calc Af Amer 34 (*)    All other components within normal limits  CBC - Abnormal; Notable for the following components:   RBC 3.79 (*)    Hemoglobin 9.4 (*)    HCT 30.9 (*)    MCH 24.8 (*)    All other components within normal limits  TROPONIN I  PROTIME-INR  TROPONIN I   ____________________________________________  EKG  ED ECG REPORT I, Rudene Re, the attending physician, personally viewed and interpreted this ECG.  Sinus bradycardia, rate of 57, normal intervals, normal axis, no ST elevations or depressions.  Unchanged from prior. ____________________________________________  RADIOLOGY  I have personally reviewed the images performed during this visit and I agree with the Radiologist's read.   Interpretation by Radiologist:  Dg Chest 2 View  Result Date: 12/05/2018 CLINICAL DATA:  Chest pain began this morning EXAM: CHEST - 2 VIEW COMPARISON:  Chest x-ray of 11/16/2017 FINDINGS: No active  infiltrate or effusion is seen. Mediastinal and hilar contours appear stable. Cardiomegaly is stable. No acute bony abnormality is seen. Coronary artery stent is noted. IMPRESSION: Stable cardiomegaly.  No active lung disease. Electronically Signed   By: Ivar Drape M.D.   On: 12/05/2018 10:47     ____________________________________________   PROCEDURES  Procedure(s) performed: None Procedures Critical Care performed: yes  CRITICAL CARE Performed by: Rudene Re  ?  Total critical care time: 30 min  Critical care time was exclusive of separately billable procedures and treating other patients.  Critical care was necessary to treat or prevent imminent or life-threatening deterioration.  Critical care was time spent personally by me on the following activities: development of treatment plan with patient and/or surrogate as well as nursing, discussions with consultants, evaluation of patient's response to treatment, examination of patient, obtaining history from patient or surrogate, ordering and performing treatments and interventions, ordering and review of laboratory studies, ordering and review of radiographic studies, pulse oximetry and re-evaluation of patient's condition.  ____________________________________________   INITIAL IMPRESSION / ASSESSMENT AND PLAN / ED COURSE  77 y.o. male with a history of hypertension, hyperlipidemia, native coronary artery disease on Plavix and aspirin, peripheral vascular disease who presents for evaluation of chest pain.  Patient currently endorsing mild 2/10 discomfort in his chest.  EKG and first troponin are within normal limits.  Discussed with Dr. Clayborn Bigness, patient's cardiologist who recommended starting him on heparin for severely abnormal stress test concerning for multivessel disease.  He recommend admitting patient to the hospitalist service for emergent catheterization.  Patient be started on heparin and nitro drip per his  recommendation.  Patient was given a full dose of aspirin.      As part of my medical decision making, I reviewed the following  data within the [facility] notes reviewed and incorporated, Labs reviewed , EKG interpreted , Old EKG reviewed, Old chart reviewed, Radiograph reviewed , Discussed with admitting physician , A consult was requested and obtained from this/these consultant(s) Cardiology, Notes from prior ED visits and  Controlled Substance Database    Pertinent labs & imaging results that were available during my care of the patient were reviewed by me and considered in my medical decision making (see chart for details).    ____________________________________________   FINAL CLINICAL IMPRESSION(S) / ED DIAGNOSES  Final diagnoses:  Unstable angina ([place])      NEW MEDICATIONS STARTED DURING THIS VISIT:  ED Discharge Orders    None       Note:  This document was prepared using Dragon voice recognition software and may include unintentional dictation errors.    Rudene Re, MD 12/05/18 [phone]

## 2018-12-05 NOTE — Progress Notes (Signed)
ANTICOAGULATION CONSULT NOTE - Initial Consult  Pharmacy Consult for heparin Indication: chest pain/ACS  Allergies  Allergen Reactions  . Gabapentin     Other reaction(s): Other (See Comments) Tremors  . Peanut-Containing Drug Products Anaphylaxis  . Penicillins Hives and Rash    Has patient had a PCN reaction causing immediate rash, facial/tongue/throat swelling, SOB or lightheadedness with hypotension: Yes Has patient had a PCN reaction causing severe rash involving mucus membranes or skin necrosis: No Has patient had a PCN reaction that required hospitalization No Has patient had a PCN reaction occurring within the last 10 years: No If all of the above answers are "NO", then may proceed with Cephalosporin use.  . Bee Venom Swelling  . Influenza Vaccines Hives  . Inh [Isoniazid] Hives  . Kenalog [Triamcinolone Acetonide] Hives  . Levaquin [Levofloxacin] Other (See Comments)    Tendon, ligament pain.   . Nalfon [Fenoprofen Calcium] Hives  . Naproxen   . Peanut Oil   . Nsaids Rash    Nalfon 600 Nalfon 600    Patient Measurements: Height: 5\' 6"  (167.6 cm) Weight: 191 lb 14.4 oz (87 kg) IBW/kg (Calculated) : 63.8 Heparin Dosing Weight: 81.7 kg  Vital Signs: Temp: 98 F (36.7 C) (12/27 2022) Temp Source: Oral (12/27 2022) BP: 142/62 (12/27 2022) Pulse Rate: 74 (12/27 2022)  Labs: Recent Labs    12/05/18 1036 12/05/18 1551 12/05/18 2058  HGB 9.4*  --   --   HCT 30.9*  --   --   PLT 317  --   --   APTT 32  --   --   LABPROT 12.9  --   --   INR 0.98  --   --   HEPARINUNFRC  --   --  0.46  CREATININE 2.11*  --   --   TROPONINI <0.03 <0.03 <0.03    Estimated Creatinine Clearance: 29.8 mL/min (A) (by C-G formula based on SCr of 2.11 mg/dL (H)).   Medical History: Past Medical History:  Diagnosis Date  . Arthritis   . Atrioventricular canal (AVC)    irregular heart beats  . Barrett esophagus   . Cancer [facility]) 2002   prostate  . Colon polyp   . Diabetes  mellitus without complication ([place])   . Diverticulosis   . Gout   . Heart disease   . Hemangioma    liver  . Hyperlipidemia   . Hypertension   . Myocardial infarct ([place])   . Ocular hypertension   . Peripheral vascular disease ([place])   . Skin cancer   . Sleep apnea   . Vitreoretinal degeneration     Medications: as ordered  Assessment: 77 yom cc chest pain with PMH HTN, HLD, CAD on DAPT, PVD. PTA is not completed at this time. CBC, BMP, PT/INR are ordered, resulted and appropriate. Pharmacy has been consulted to dose heparin for ACS.   12/27 21:00 HL 0.46    Goal of Therapy:  Heparin level 0.3-0.7 units/ml Monitor platelets by anticoagulation protocol: Yes   Plan: Will continue with current rate of 1000 units/hr. Next Heparin level in 8 hours to confirm. Daily CBC while on Heparin drip.  Paulina Fusi, PharmD, BCPS 12/05/2018 9:59 PM

## 2018-12-05 NOTE — ED Notes (Signed)
Patient transported to X-ray 

## 2018-12-05 NOTE — H&P (Signed)
[facility] NAME: Mike Decker    MR#:  [id number]  DATE OF BIRTH:  [birth year]  DATE OF ADMISSION:  12/05/2018  PRIMARY CARE PHYSICIAN: Cletis Athens, MD   REQUESTING/REFERRING PHYSICIAN:   CHIEF COMPLAINT: Chest pain   Chief Complaint  Patient presents with  . Chest Pain    HISTORY OF PRESENT ILLNESS:  Mike Decker  is a 77 y.o. male with a known history of diabetes mellitus type 2, CKD stage III, essential hypertension, hyperlipidemia, CAD who follows up with Dr. Helyn App abnormal stress test yesterday comes in with chest pain that started this morning.  Patient had chest pain lasted for 45 minutes associated with radiation of chest pain to left arm, , He had associated shortness of breath, dizziness.  Dr. Everlene Balls recommended admission, possible cardiac cath.  Patient started on nitro drip, heparin drip in the emergency room, initial troponins are negative, EKG did not show new changes.  Currently chest pain-free.Marland Kitchen PAST MEDICAL HISTORY:   Past Medical History:  Diagnosis Date  . Arthritis   . Atrioventricular canal (AVC)    irregular heart beats  . Barrett esophagus   . Cancer [facility]) 2002   prostate  . Colon polyp   . Diabetes mellitus without complication ([place])   . Diverticulosis   . Gout   . Heart disease   . Hemangioma    liver  . Hyperlipidemia   . Hypertension   . Myocardial infarct ([place])   . Ocular hypertension   . Peripheral vascular disease ([place])   . Skin cancer   . Sleep apnea   . Vitreoretinal degeneration     PAST SURGICAL HISTOIRY:   Past Surgical History:  Procedure Laterality Date  . CATARACT EXTRACTION  2011, 2012  . CORONARY ANGIOPLASTY WITH STENT PLACEMENT  2012  . ESOPHAGOGASTRODUODENOSCOPY (EGD) WITH PROPOFOL N/A 06/01/2016   Procedure: ESOPHAGOGASTRODUODENOSCOPY (EGD) WITH PROPOFOL;  Surgeon: Manya Silvas, MD;  Location[facility] ENDOSCOPY;  Service: Endoscopy;  Laterality:  N/A;  . HERNIA REPAIR     x2  . INTRAOCULAR LENS INSERTION    . LEFT HEART CATH AND CORONARY ANGIOGRAPHY N/A 05/09/2017   Procedure: Left Heart Cath and Coronary Angiography;  Surgeon: Yolonda Kida, MD;  Location: [place] CV LAB;  Service: Cardiovascular;  Laterality: N/A;  . NOSE SURGERY     submucous resection  . PROSTATE SURGERY  2002  . ROTATOR CUFF REPAIR Right     SOCIAL HISTORY:   Social History   Tobacco Use  . Smoking status: Former Smoker    Years: 20.00    Types: Cigarettes    Last attempt to quit: 12/10/1976    Years since quitting: 42.0  . Smokeless tobacco: Never Used  Substance Use Topics  . Alcohol use: No    FAMILY HISTORY:   Family History  Problem Relation Age of Onset  . Stroke Maternal Grandfather     DRUG ALLERGIES:   Allergies  Allergen Reactions  . Gabapentin     Other reaction(s): Other (See Comments) Tremors  . Peanut-Containing Drug Products Anaphylaxis  . Penicillins Hives and Rash    Has patient had a PCN reaction causing immediate rash, facial/tongue/throat swelling, SOB or lightheadedness with hypotension: Yes Has patient had a PCN reaction causing severe rash involving mucus membranes or skin necrosis: No Has patient had a PCN reaction that required hospitalization No Has patient had a PCN reaction occurring within the last 10 years: No  If all of the above answers are "NO", then may proceed with Cephalosporin use.  . Bee Venom Swelling  . Influenza Vaccines Hives  . Inh [Isoniazid] Hives  . Kenalog [Triamcinolone Acetonide] Hives  . Levaquin [Levofloxacin] Other (See Comments)    Tendon, ligament pain.   . Nalfon [Fenoprofen Calcium] Hives  . Naproxen   . Peanut Oil   . Nsaids Rash    Nalfon 600 Nalfon 600    REVIEW OF SYSTEMS:  CONSTITUTIONAL: No fever, fatigue or weakness.  EYES: No blurred or double vision.  EARS, NOSE, AND THROAT: No tinnitus or ear pain.  RESPIRATORY: No cough, shortness of breath,  wheezing or hemoptysis.  CARDIOVASCULAR: Pain, radiation to the left arm, dizziness, shortness of breath today. GASTROINTESTINAL: No nausea, vomiting, diarrhea or abdominal pain.  GENITOURINARY: No dysuria, hematuria.  ENDOCRINE: No polyuria, nocturia,  HEMATOLOGY: No anemia, easy bruising or bleeding SKIN: No rash or lesion. MUSCULOSKELETAL: No joint pain or arthritis.   NEUROLOGIC: No tingling, numbness, weakness.  PSYCHIATRY: No anxiety or depression.   MEDICATIONS AT HOME:   Prior to Admission medications   Medication Sig Start Date End Date Taking? Authorizing Provider  acetaminophen (TYLENOL) 500 MG tablet Take 1,000 mg by mouth every 6 (six) hours as needed for mild pain, moderate pain or headache.   Yes [provider]  allopurinol (ZYLOPRIM) 100 MG tablet Take 200 mg by mouth 2 (two) times daily.    Yes [provider]  amLODipine (NORVASC) 5 MG tablet Take 5 mg by mouth 2 (two) times daily.  04/02/17  Yes [provider]  aspirin (ASPIRIN CHILDRENS) 81 MG chewable tablet Chew 1 tablet (81 mg total) by mouth daily. 10/09/15  Yes Mody, Ulice Bold, MD  budesonide-formoterol (SYMBICORT) 160-4.5 MCG/ACT inhaler Inhale 2 puffs into the lungs 2 (two) times daily.   Yes [provider]  cetirizine (ZYRTEC) 5 MG tablet Take 5 mg by mouth daily as needed for allergies or rhinitis.    Yes [provider]  citalopram (CELEXA) 10 MG tablet Take 10 mg by mouth daily.   Yes [provider]  clopidogrel (PLAVIX) 75 MG tablet Take 1 tablet (75 mg total) by mouth daily. 10/09/15  Yes Mody, Ulice Bold, MD  colchicine 0.6 MG tablet Take 1 tablet (0.6 mg total) by mouth daily. Patient taking differently: Take 1.8 mg by mouth daily as needed (gout flares).  11/17/18  Yes Mody, Ulice Bold, MD  dextromethorphan-guaiFENesin (MUCINEX DM) 30-600 MG 12hr tablet Take 1 tablet by mouth 2 (two) times daily as needed for cough.   Yes [provider]  EPINEPHrine  (EPI-PEN) 0.3 mg/0.3 mL SOAJ injection Inject into the muscle as needed (anaphylaxis).    Yes [provider]  fluticasone (FLONASE) 50 MCG/ACT nasal spray Place 2 sprays into both nostrils daily.  11/04/18  Yes [provider]  furosemide (LASIX) 20 MG tablet Take 2 tablets (40 mg total) by mouth daily. 11/17/18  Yes Mody, Ulice Bold, MD  insulin aspart (NOVOLOG FLEXPEN) 100 UNIT/ML FlexPen Inject 14-24 Units into the skin See admin instructions. Inject 14u under the skin at breakfast, 24u at lunch and 24u under the skin at dinnertime according to sliding scale   Yes [provider]  insulin glargine (LANTUS) 100 UNIT/ML injection Inject 62 Units into the skin at bedtime.    Yes [provider]  isosorbide mononitrate (IMDUR) 120 MG 24 hr tablet Take 1 tablet (120 mg total) by mouth daily. 10/09/15  Yes Bettey Costa, MD  losartan (COZAAR) 25 MG tablet Take 25 mg by mouth daily. 10/23/18  Yes [provider]  magnesium gluconate (MAGONATE) 500 MG tablet Take 500 mg by mouth 2 (two) times daily.    Yes [provider]  methocarbamol (ROBAXIN) 500 MG tablet Take 500 mg by mouth 2 (two) times daily.  11/08/18  Yes [provider]  metoprolol tartrate (LOPRESSOR) 50 MG tablet Take 75 mg by mouth 2 (two) times daily.    Yes [provider]  nitroGLYCERIN (NITROSTAT) 0.4 MG SL tablet Place 0.4 mg under the tongue every 5 (five) minutes x 3 doses as needed for chest pain.    Yes [provider]  Omega-3 Fatty Acids (FISH OIL) 1000 MG CAPS Take 1 capsule by mouth daily.    Yes [provider]  pantoprazole (PROTONIX) 40 MG tablet Take 40 mg by mouth 2 (two) times daily.   Yes [provider]  rosuvastatin (CRESTOR) 40 MG tablet Take 40 mg by mouth every evening.    Yes [provider]  traMADol (ULTRAM) 50 MG tablet Take 50 mg by mouth every 4 (four) hours as needed for moderate pain.    Yes [provider]  Vitamin D, Ergocalciferol, (DRISDOL) 50000 units CAPS capsule Take 50,000 Units by mouth every 30 (thirty) days.    Yes [provider]  ipratropium-albuterol (DUONEB) 0.5-2.5 (3) MG/3ML SOLN Take 3 mLs by nebulization every 4 (four) hours as needed. Patient not taking: Reported on 11/14/2018 05/15/17   Hillary Bow, MD  Metoprolol Tartrate 75 MG TABS Take 50 mg by mouth 2 (two) times daily. 11/17/18   Bettey Costa, MD      VITAL SIGNS:  Blood pressure (!) 156/72, pulse (!) 57, temperature 98 F (36.7 C), temperature source Oral, resp. rate 15, height 5\' 6"  (1.676 m), weight 86.2 kg, SpO2 99 %.  PHYSICAL EXAMINATION:  GENERAL:  77 y.o.-year-old patient lying in the bed with no acute distress.  EYES: Pupils equal, round, reactive to light. No scleral icterus. Extraocular muscles intact.  HEENT: Head atraumatic, normocephalic. Oropharynx and nasopharynx clear.  NECK:  Supple, no jugular venous distention. No thyroid enlargement, no tenderness.  LUNGS: Normal breath sounds bilaterally, no wheezing, rales,rhonchi or crepitation. No use of accessory muscles of respiration.  CARDIOVASCULAR: S1, S2 normal. No murmurs, rubs, or gallops.  ABDOMEN: Soft, nontender, nondistended. Bowel sounds present. No organomegaly or mass.  EXTREMITIES: No pedal edema, cyanosis, or clubbing.  NEUROLOGIC: Cranial nerves II through XII are intact. Muscle strength 5/5 in all extremities. Sensation intact. Gait not checked.  PSYCHIATRIC: The patient is alert and oriented x 3.  SKIN: No obvious rash, lesion, or ulcer.   LABORATORY PANEL:   CBC Recent Labs  Lab 12/05/18 1036  WBC 7.3  HGB 9.4*  HCT 30.9*  PLT 317   ------------------------------------------------------------------------------------------------------------------  Chemistries  Recent Labs  Lab 12/05/18 1036  NA 138  K 4.3  CL 106  CO2 22  GLUCOSE 78  BUN 47*  CREATININE 2.11*  CALCIUM 9.7    ------------------------------------------------------------------------------------------------------------------  Cardiac Enzymes Recent Labs  Lab 12/05/18 1036  TROPONINI <0.03   ------------------------------------------------------------------------------------------------------------------  RADIOLOGY:  Dg Chest 2 View  Result Date: 12/05/2018 CLINICAL DATA:  Chest pain began this morning EXAM: CHEST - 2 VIEW COMPARISON:  Chest x-ray of 11/16/2017 FINDINGS: No active infiltrate or effusion is seen. Mediastinal and hilar contours appear stable. Cardiomegaly is stable. No acute bony abnormality is seen. Coronary artery stent is noted. IMPRESSION: Stable  cardiomegaly.  No active lung disease. Electronically Signed   By: Ivar Drape M.D.   On: 12/05/2018 10:47    EKG:   Orders placed or performed during the hospital encounter of 12/05/18  . EKG 12-Lead  . EKG 12-Lead  . ED EKG within 10 minutes  . ED EKG within 10 minutes   Sinus bradycardia with a rate of 57 bpm, normal intervals, normal axis, no ST-T changes. IMPRESSION AND PLAN:   #50/77 year old male with history of hypertension, hyperlipidemia, native CAD on aspirin, Plavix, both vascular disease, diabetes mellitus type 2, CKD stage III comes in because of left-sided chest pain going to the left arm, had abnormal stress test yesterday.  #1/ high risk chest pain with abnormal stress test.  Chest pain concerning for unstable angina: Cardiology office, spoke with Dr. Everlene Balls, patient has no EKG changes, no troponin elevation at this time, he recommended aspirin, heparin drip, nitro drip, cycle troponins, possible cardiac cath on Monday.  Same explained to the patient.  Patient has CKD stage III, cardiology is requesting nephrology consult before considering cardiac cath on Monday.  Same I discussed with patient. #2/ CKD stage III, baseline creatinine is around 2.1: Patient follows up with Dr. Juleen China.  Nephrology consult placed,  patient needs bicarb infusion before the cardiac cath to prevent contrast-induced renal failure. 3.  Diabetes mellitus type 2: Patient follows up with Dr. Gabriel Carina from endocrinology, high-dose Lantus 62 units at night, continue Lantus, order sliding scale insulin with coverage, start mealtime insulin according to the blood sugar response.  Same discussed with patient. Spoke with cardiology.  All the records are reviewed and case discussed with ED provider. Management plans discussed with the patient, family and they are in agreement.  CODE STATUS: full  TOTAL TIME TAKING CARE OF THIS PATIENT: 39minutes.    Epifanio Lesches M.D on 12/05/2018 at 3:40 PM  Between 7am to 6pm - Pager - [phone]  After 6pm go to [email or web address] - password [email or web address] [facility]  Office  [phone]  CC: Primary care physician; Cletis Athens, MD  Note: This dictation was prepared with Dragon dictation along with smaller phrase technology. Any transcriptional errors that result from this process are unintentional.

## 2018-12-06 LAB — GLUCOSE, CAPILLARY
GLUCOSE-CAPILLARY: 180 mg/dL — AB (ref 70–99)
Glucose-Capillary: 163 mg/dL — ABNORMAL HIGH (ref 70–99)
Glucose-Capillary: 209 mg/dL — ABNORMAL HIGH (ref 70–99)
Glucose-Capillary: 207 mg/dL — ABNORMAL HIGH (ref 70–99)

## 2018-12-06 LAB — BASIC METABOLIC PANEL
Anion gap: 10 (ref 5–15)
BUN: 49 mg/dL — ABNORMAL HIGH (ref 8–23)
CO2: 22 mmol/L (ref 22–32)
Calcium: 9 mg/dL (ref 8.9–10.3)
Chloride: 103 mmol/L (ref 98–111)
Creatinine, Ser: 2.28 mg/dL — ABNORMAL HIGH (ref 0.61–1.24)
GFR calc Af Amer: 31 mL/min — ABNORMAL LOW (ref >60)
GFR calc non Af Amer: 26 mL/min — ABNORMAL LOW (ref >60)
Glucose, Bld: 173 mg/dL — ABNORMAL HIGH (ref 70–99)
Potassium: 4.5 mmol/L (ref 3.5–5.1)
SODIUM: 135 mmol/L (ref 135–145)

## 2018-12-06 LAB — IRON AND TIBC
Iron: 22 ug/dL — ABNORMAL LOW (ref 45–182)
SATURATION RATIOS: 6 % — AB (ref 17.9–39.5)
TIBC: 383 ug/dL (ref 250–450)
UIBC: 362 ug/dL

## 2018-12-06 LAB — CBC
HCT: 28.3 % — ABNORMAL LOW (ref 39.0–52.0)
Hemoglobin: 8.5 g/dL — ABNORMAL LOW (ref 13.0–17.0)
MCH: 24.8 pg — ABNORMAL LOW (ref 26.0–34.0)
MCHC: 30 g/dL (ref 30.0–36.0)
MCV: 82.5 fL (ref 80.0–100.0)
PLATELETS: 313 10*3/uL (ref 150–400)
RBC: 3.43 MIL/uL — ABNORMAL LOW (ref 4.22–5.81)
RDW: 15.6 % — ABNORMAL HIGH (ref 11.5–15.5)
WBC: 9.8 10*3/uL (ref 4.0–10.5)
nRBC: 0 % (ref 0.0–0.2)

## 2018-12-06 LAB — RETICULOCYTES
Immature Retic Fract: 17.7 % — ABNORMAL HIGH (ref 2.3–15.9)
RBC.: 3.48 MIL/uL — ABNORMAL LOW (ref 4.22–5.81)
Retic Count, Absolute: 42.8 10*3/uL (ref 19.0–186.0)
Retic Ct Pct: 1.2 % (ref 0.4–3.1)

## 2018-12-06 LAB — TSH: TSH: 1.948 u[IU]/mL (ref 0.350–4.500)

## 2018-12-06 LAB — VITAMIN B12: Vitamin B-12: 173 pg/mL — ABNORMAL LOW (ref 180–914)

## 2018-12-06 LAB — HEPARIN LEVEL (UNFRACTIONATED): Heparin Unfractionated: 0.42 IU/mL (ref 0.30–0.70)

## 2018-12-06 LAB — FERRITIN: Ferritin: 11 ng/mL — ABNORMAL LOW (ref 24–336)

## 2018-12-06 LAB — TROPONIN I: Troponin I: <0.03 ng/mL (ref <0.03)

## 2018-12-06 LAB — FOLATE: Folate: 19.6 ng/mL (ref >5.9)

## 2018-12-06 MED ORDER — NITROGLYCERIN 0.4 MG SL SUBL
0.4000 mg | SUBLINGUAL_TABLET | SUBLINGUAL | Status: DC | PRN
  Administered 2018-12-11: 0.4 mg via SUBLINGUAL
  Filled 2018-12-06: qty 1

## 2018-12-06 MED ORDER — RANOLAZINE ER 500 MG PO TB12: 500.0000 mg | ORAL_TABLET | Freq: Two times a day (BID) | ORAL | Status: DC

## 2018-12-06 MED ORDER — ISOSORBIDE MONONITRATE ER 60 MG PO TB24
60.0000 mg | ORAL_TABLET | Freq: Two times a day (BID) | ORAL | Status: DC
  Administered 2018-12-06 – 2018-12-12 (×14): 60 mg via ORAL
  Filled 2018-12-06 (×14): qty 1

## 2018-12-06 MED ORDER — TRAZODONE HCL 50 MG PO TABS
50.0000 mg | ORAL_TABLET | Freq: Every evening | ORAL | Status: DC | PRN
  Administered 2018-12-07 – 2018-12-14 (×7): 50 mg via ORAL
  Filled 2018-12-06 (×7): qty 1

## 2018-12-06 MED ORDER — RANOLAZINE ER 500 MG PO TB12
500.0000 mg | ORAL_TABLET | Freq: Two times a day (BID) | ORAL | Status: DC
  Administered 2018-12-06 – 2018-12-15 (×18): 500 mg via ORAL
  Filled 2018-12-06 (×20): qty 1

## 2018-12-06 MED ORDER — NITROGLYCERIN 0.4 MG SL SUBL
SUBLINGUAL_TABLET | SUBLINGUAL | Status: AC
  Administered 2018-12-06: 11:00:00
  Filled 2018-12-06: qty 1

## 2018-12-06 NOTE — Progress Notes (Signed)
[facility]  ROUNDING NOTE   Subjective:  Patient presents with unstable angina.  Well known to [place].  Due for cardiac catherization. Baseline Cr 1.96.   Currently Cr 2.28.    Objective:  Vital signs in last 24 hours:  Temp:  [98 F (36.7 C)-98.4 F (36.9 C)] 98.4 F (36.9 C) (12/28 0900) Pulse Rate:  [56-74] 69 (12/28 1100) Resp:  [16-20] 18 (12/28 1100) BP: (95-142)/(47-65) 122/49 (12/28 1100) SpO2:  [96 %-100 %] 97 % (12/28 1100) Weight:  [86.3 kg-87 kg] 86.3 kg (12/28 0436)  Weight change:  Filed Weights   12/05/18 1029 12/05/18 1557 12/06/18 0436  Weight: 86.2 kg 87 kg 86.3 kg    Intake/Output: I/O last 3 completed shifts: In: 882.2 [I.V.:382.2; IV QASTMHDQQ:229] Out: 250 [Urine:250]   Intake/Output this shift:  Total I/O In: -  Out: 200 [Urine:200]  Physical Exam: General: No acute distress  Head: Normocephalic, atraumatic. Moist oral mucosal membranes  Eyes: Anicteric  Neck: Supple, trachea midline  Lungs:  Clear to auscultation, normal effort  Heart: S1S2 no rubs  Abdomen:  Soft, nontender, bowel sounds present  Extremities: No peripheral edema.  Neurologic: Awake, alert, following commands  Skin: No lesions        Basic Metabolic Panel: Recent Labs  Lab 12/05/18 1036 12/06/18 0336  NA 138 135  K 4.3 4.5  CL 106 103  CO2 22 22  GLUCOSE 78 173*  BUN 47* 49*  CREATININE 2.11* 2.28*  CALCIUM 9.7 9.0    Liver Function Tests: No results for input(s): AST, ALT, ALKPHOS, BILITOT, PROT, ALBUMIN in the last 168 hours. No results for input(s): LIPASE, AMYLASE in the last 168 hours. No results for input(s): AMMONIA in the last 168 hours.  CBC: Recent Labs  Lab 12/05/18 1036 12/06/18 0336  WBC 7.3 9.8  HGB 9.4* 8.5*  HCT 30.9* 28.3*  MCV 81.5 82.5  PLT 317 313    Cardiac Enzymes: Recent Labs  Lab 12/05/18 1036 12/05/18 1551 12/05/18 2058 12/06/18 0336  TROPONINI <0.03 <0.03 <0.03 <0.03    BNP: Invalid input(s):  POCBNP  CBG: Recent Labs  Lab 12/05/18 1650 12/05/18 2044 12/06/18 0741 12/06/18 1143  GLUCAP 134* 189* 163* 61*    Microbiology: Results for orders placed or performed during the hospital encounter of 05/06/17  Blood Culture (routine x 2)     Status: None   Collection Time: 05/06/17  7:02 PM  Result Value Ref Range Status   Specimen Description BLOOD RIGHT HAND  Final   Special Requests   Final    BOTTLES DRAWN AEROBIC AND ANAEROBIC Blood Culture adequate volume   Culture NO GROWTH 5 DAYS  Final   Report Status 05/11/2017 FINAL  Final  Blood Culture (routine x 2)     Status: None   Collection Time: 05/06/17  7:02 PM  Result Value Ref Range Status   Specimen Description BLOOD LEFT AC  Final   Special Requests   Final    BOTTLES DRAWN AEROBIC AND ANAEROBIC Blood Culture adequate volume   Culture NO GROWTH 5 DAYS  Final   Report Status 05/11/2017 FINAL  Final  Urine culture     Status: None   Collection Time: 05/06/17  7:18 PM  Result Value Ref Range Status   Specimen Description URINE, RANDOM  Final   Special Requests NONE  Final   Culture   Final    NO GROWTH Performed at [facility], [place] [address], [place], [place]  [address]    Report Status 05/08/2017 FINAL  Final  Gastrointestinal Panel by PCR , Stool     Status: Abnormal   Collection Time: 05/07/17  4:44 AM  Result Value Ref Range Status   Campylobacter species NOT DETECTED NOT DETECTED Final   Plesimonas shigelloides NOT DETECTED NOT DETECTED Final   Salmonella species DETECTED (A) NOT DETECTED Final    Comment: RESULT CALLED TO, READ BACK BY AND VERIFIED WITH: BETH BUONO 05/07/17 0642 ALV    Yersinia enterocolitica NOT DETECTED NOT DETECTED Final   Vibrio species NOT DETECTED NOT DETECTED Final   Vibrio cholerae NOT DETECTED NOT DETECTED Final   Enteroaggregative E coli (EAEC) NOT DETECTED NOT DETECTED Final   Enteropathogenic E coli (EPEC) NOT DETECTED NOT DETECTED Final   Enterotoxigenic E coli  (ETEC) NOT DETECTED NOT DETECTED Final   Shiga like toxin producing E coli (STEC) NOT DETECTED NOT DETECTED Final   E. coli O157 NOT DETECTED NOT DETECTED Final   Shigella/Enteroinvasive E coli (EIEC) NOT DETECTED NOT DETECTED Final   Cryptosporidium NOT DETECTED NOT DETECTED Final   Cyclospora cayetanensis NOT DETECTED NOT DETECTED Final   Entamoeba histolytica NOT DETECTED NOT DETECTED Final   Giardia lamblia NOT DETECTED NOT DETECTED Final   Adenovirus F40/41 NOT DETECTED NOT DETECTED Final   Astrovirus NOT DETECTED NOT DETECTED Final   Norovirus GI/GII NOT DETECTED NOT DETECTED Final   Rotavirus A NOT DETECTED NOT DETECTED Final   Sapovirus (I, II, IV, and V) NOT DETECTED NOT DETECTED Final  C difficile quick scan w PCR reflex     Status: None   Collection Time: 05/07/17  4:44 AM  Result Value Ref Range Status   C Diff antigen NEGATIVE NEGATIVE Final   C Diff toxin NEGATIVE NEGATIVE Final   C Diff interpretation No C. difficile detected.  Final  MRSA PCR Screening     Status: None   Collection Time: 05/07/17  6:24 AM  Result Value Ref Range Status   MRSA by PCR NEGATIVE NEGATIVE Final    Comment:        The GeneXpert MRSA Assay (FDA approved for NASAL specimens only), is one component of a comprehensive MRSA colonization surveillance program. It is not intended to diagnose MRSA infection nor to guide or monitor treatment for MRSA infections.     Coagulation Studies: Recent Labs    12/05/18 1036  LABPROT 12.9  INR 0.98    Urinalysis: No results for input(s): COLORURINE, LABSPEC, PHURINE, GLUCOSEU, HGBUR, BILIRUBINUR, KETONESUR, PROTEINUR, UROBILINOGEN, NITRITE, LEUKOCYTESUR in the last 72 hours.  Invalid input(s): APPERANCEUR    Imaging: Dg Chest 2 View  Result Date: 12/05/2018 CLINICAL DATA:  Chest pain began this morning EXAM: CHEST - 2 VIEW COMPARISON:  Chest x-ray of 11/16/2017 FINDINGS: No active infiltrate or effusion is seen. Mediastinal and hilar  contours appear stable. Cardiomegaly is stable. No acute bony abnormality is seen. Coronary artery stent is noted. IMPRESSION: Stable cardiomegaly.  No active lung disease. Electronically Signed   By: Ivar Drape M.D.   On: 12/05/2018 10:47     Medications:   . heparin 1,000 Units/hr (12/06/18 0600)   . allopurinol  200 mg Oral BID  . amLODipine  5 mg Oral BID  . aspirin  81 mg Oral Daily  . citalopram  10 mg Oral Daily  . clopidogrel  75 mg Oral Daily  . docusate sodium  100 mg Oral BID  . fluticasone  2 spray Each Nare Daily  .  insulin aspart  0-9 Units Subcutaneous TID WC  . insulin glargine  62 Units Subcutaneous QHS  . isosorbide mononitrate  60 mg Oral BID  . methocarbamol  500 mg Oral BID  . metoprolol tartrate  75 mg Oral BID  . mometasone-formoterol  2 puff Inhalation BID  . ranolazine  500 mg Oral BID  . rosuvastatin  40 mg Oral QPM   acetaminophen **OR** acetaminophen, bisacodyl, nitroGLYCERIN, ondansetron **OR** ondansetron (ZOFRAN) IV, traZODone  Assessment/ Plan:  Mike Decker is a 77 y.o. white male with prostate cancer, diabetes mellitus type 2, diverticulosis, gout, coronary artery disease with 75% RCA lesion, hyperlipidemia, hypertension, peripheral vascular disease, skin cancer, obstructive sleep apnea was admitted with unstable angina.    1.  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 GFR appears to be at its baseline.   - Pt is high risk for contrast induced nephropathy but pt with recent NSTEMI and now with unstable angina.  Therefore he is in need of catherization.  Recommend starting pt on NS at 125cc/hr starting late tomorrow.   2.  Hypertension.  Continue amlodipine, metoprolol.  3.  Anemia chronic kidney disease.  hgb 8.5 at the moment, consider epogen as outpt.  4.  Gout.   Continue allopurinol 200 mg p.o. twice daily    LOS: 1 Ziah Leandro 12/28/20191:20 PM

## 2018-12-06 NOTE — Progress Notes (Signed)
Pt c/o headache. Dr. Jerelyn Charles in. Nitro gtt d/c'd.

## 2018-12-06 NOTE — Progress Notes (Signed)
ANTICOAGULATION CONSULT NOTE - Initial Consult  Pharmacy Consult for heparin Indication: chest pain/ACS  Allergies  Allergen Reactions  . Gabapentin     Other reaction(s): Other (See Comments) Tremors  . Peanut-Containing Drug Products Anaphylaxis  . Penicillins Hives and Rash    Has patient had a PCN reaction causing immediate rash, facial/tongue/throat swelling, SOB or lightheadedness with hypotension: Yes Has patient had a PCN reaction causing severe rash involving mucus membranes or skin necrosis: No Has patient had a PCN reaction that required hospitalization No Has patient had a PCN reaction occurring within the last 10 years: No If all of the above answers are "NO", then may proceed with Cephalosporin use.  . Bee Venom Swelling  . Influenza Vaccines Hives  . Inh [Isoniazid] Hives  . Kenalog [Triamcinolone Acetonide] Hives  . Levaquin [Levofloxacin] Other (See Comments)    Tendon, ligament pain.   . Nalfon [Fenoprofen Calcium] Hives  . Naproxen   . Peanut Oil   . Nsaids Rash    Nalfon 600 Nalfon 600    Patient Measurements: Height: 5\' 6"  (167.6 cm) Weight: 191 lb 14.4 oz (87 kg) IBW/kg (Calculated) : 63.8 Heparin Dosing Weight: 81.7 kg  Vital Signs: Temp: 98 F (36.7 C) (12/27 2022) Temp Source: Oral (12/27 2022) BP: 107/52 (12/28 0225) Pulse Rate: 64 (12/28 0225)  Labs: Recent Labs    12/05/18 1036 12/05/18 1551 12/05/18 2058 12/06/18 0336  HGB 9.4*  --   --  8.5*  HCT 30.9*  --   --  28.3*  PLT 317  --   --  313  APTT 32  --   --   --   LABPROT 12.9  --   --   --   INR 0.98  --   --   --   HEPARINUNFRC  --   --  0.46 0.42  CREATININE 2.11*  --   --  2.28*  TROPONINI <0.03 <0.03 <0.03  --     Estimated Creatinine Clearance: 27.6 mL/min (A) (by C-G formula based on SCr of 2.28 mg/dL (H)).   Medical History: Past Medical History:  Diagnosis Date  . Arthritis   . Atrioventricular canal (AVC)    irregular heart beats  . Barrett esophagus   .  Cancer [facility]) 2002   prostate  . Colon polyp   . Diabetes mellitus without complication ([place])   . Diverticulosis   . Gout   . Heart disease   . Hemangioma    liver  . Hyperlipidemia   . Hypertension   . Myocardial infarct ([place])   . Ocular hypertension   . Peripheral vascular disease ([place])   . Skin cancer   . Sleep apnea   . Vitreoretinal degeneration     Medications: as ordered  Assessment: 77 yom cc chest pain with PMH HTN, HLD, CAD on DAPT, PVD. PTA is not completed at this time. CBC, BMP, PT/INR are ordered, resulted and appropriate. Pharmacy has been consulted to dose heparin for ACS.   12/27 21:00 HL 0.46   12/28 03:36 HL 0.42, 2nd therapeutic level  Goal of Therapy:  Heparin level 0.3-0.7 units/ml Monitor platelets by anticoagulation protocol: Yes   Plan: Will continue with current rate of 1000 units/hr. Next Heparin level with AM labs. Daily CBC while on Heparin drip.  Paulina Fusi, PharmD, BCPS 12/06/2018 4:26 AM

## 2018-12-06 NOTE — Progress Notes (Addendum)
Mike Decker at [place] NAME: Mike Decker    MR#:  [id number]  DATE OF BIRTH:  October 17, 1941  SUBJECTIVE:  Patient evaluated with nursing staff, patient only complaining of headache, denies chest pain/shortness of breath, changed nitroglycerin drip to nitroglycerin sublingual as needed, cardiology input appreciated, for heart cath on Monday  REVIEW OF SYSTEMS:  CONSTITUTIONAL: No fever, fatigue or weakness.  EYES: No blurred or double vision.  EARS, NOSE, AND THROAT: No tinnitus or ear pain.  RESPIRATORY: No cough, shortness of breath, wheezing or hemoptysis.  CARDIOVASCULAR: No chest pain, orthopnea, edema.  GASTROINTESTINAL: No nausea, vomiting, diarrhea or abdominal pain.  GENITOURINARY: No dysuria, hematuria.  ENDOCRINE: No polyuria, nocturia,  HEMATOLOGY: No anemia, easy bruising or bleeding SKIN: No rash or lesion. MUSCULOSKELETAL: No joint pain or arthritis.   NEUROLOGIC: No tingling, numbness, weakness.  PSYCHIATRY: No anxiety or depression.   ROS  DRUG ALLERGIES:   Allergies  Allergen Reactions  . Gabapentin     Other reaction(s): Other (See Comments) Tremors  . Peanut-Containing Drug Products Anaphylaxis  . Penicillins Hives and Rash    Has patient had a PCN reaction causing immediate rash, facial/tongue/throat swelling, SOB or lightheadedness with hypotension: Yes Has patient had a PCN reaction causing severe rash involving mucus membranes or skin necrosis: No Has patient had a PCN reaction that required hospitalization No Has patient had a PCN reaction occurring within the last 10 years: No If all of the above answers are "NO", then may proceed with Cephalosporin use.  . Bee Venom Swelling  . Influenza Vaccines Hives  . Inh [Isoniazid] Hives  . Kenalog [Triamcinolone Acetonide] Hives  . Levaquin [Levofloxacin] Other (See Comments)    Tendon, ligament pain.   . Nalfon [Fenoprofen Calcium] Hives  . Naproxen   . Peanut  Oil   . Nsaids Rash    Nalfon 600 Nalfon 600    VITALS:  Blood pressure (!) 122/49, pulse 69, temperature 98.4 F (36.9 C), temperature source Oral, resp. rate 18, height 5\' 6"  (1.676 m), weight 86.3 kg, SpO2 97 %.  PHYSICAL EXAMINATION:  GENERAL:  77 y.o.-year-old patient lying in the bed with no acute distress.  EYES: Pupils equal, round, reactive to light and accommodation. No scleral icterus. Extraocular muscles intact.  HEENT: Head atraumatic, normocephalic. Oropharynx and nasopharynx clear.  NECK:  Supple, no jugular venous distention. No thyroid enlargement, no tenderness.  LUNGS: Normal breath sounds bilaterally, no wheezing, rales,rhonchi or crepitation. No use of accessory muscles of respiration.  CARDIOVASCULAR: S1, S2 normal. No murmurs, rubs, or gallops.  ABDOMEN: Soft, nontender, nondistended. Bowel sounds present. No organomegaly or mass.  EXTREMITIES: No pedal edema, cyanosis, or clubbing.  NEUROLOGIC: Cranial nerves II through XII are intact. Muscle strength 5/5 in all extremities. Sensation intact. Gait not checked.  PSYCHIATRIC: The patient is alert and oriented x 3.  SKIN: No obvious rash, lesion, or ulcer.   Physical Exam LABORATORY PANEL:   CBC Recent Labs  Lab 12/06/18 0336  WBC 9.8  HGB 8.5*  HCT 28.3*  PLT 313   ------------------------------------------------------------------------------------------------------------------  Chemistries  Recent Labs  Lab 12/06/18 0336  NA 135  K 4.5  CL 103  CO2 22  GLUCOSE 173*  BUN 49*  CREATININE 2.28*  CALCIUM 9.0   ------------------------------------------------------------------------------------------------------------------  Cardiac Enzymes Recent Labs  Lab 12/05/18 2058 12/06/18 0336  TROPONINI <0.03 <0.03   ------------------------------------------------------------------------------------------------------------------  RADIOLOGY:  Dg Chest 2 View  Result Date: 12/05/2018 CLINICAL  DATA:  Chest pain began this morning EXAM: CHEST - 2 VIEW COMPARISON:  Chest x-ray of 11/16/2017 FINDINGS: No active infiltrate or effusion is seen. Mediastinal and hilar contours appear stable. Cardiomegaly is stable. No acute bony abnormality is seen. Coronary artery stent is noted. IMPRESSION: Stable cardiomegaly.  No active lung disease. Electronically Signed   By: Ivar Drape M.D.   On: 12/05/2018 10:47    ASSESSMENT AND PLAN:  *Acute unstable angina  Resolved on current regiment  Noted abnormal stress test on Thursday Continue ACS protocol, aspirin, Plavix, heparin drip, nitrates as needed, Ranexa, beta-blocker therapy, statin therapy, IV morphine for breakthrough pain, supplemental oxygen, cardiology input appreciated-for heart catheterization on Monday  *CKD stage III BL Cr around 2.1 Nephrology consulted given concern for possible contrast-induced nephropathy after heart catheterization  *Chronic diabetes mellitus type 2  Continue sliding scale insulin with Accu-Cheks per routine, cardiac/carbohydrate consistent diet  *Acute on chronic anemia Check anemia work-up and treat as indicated  *Chronic obstructive sleep apnea CPAP at at bedtime/as needed  All the records are reviewed and case discussed with Care Management/Social Workerr. Management plans discussed with the patient, family and they are in agreement.  CODE STATUS: full  TOTAL TIME TAKING CARE OF THIS PATIENT: 40 minutes.    POSSIBLE D/C IN 2-3 DAYS, DEPENDING ON CLINICAL CONDITION.   Avel Peace Salary M.D on 12/06/2018   Between 7am to 6pm - Pager - [phone]  After 6pm go to [email or web address] - password [email or web address] [facility]  Office  [phone]  CC: Primary care physician; Cletis Athens, MD  Note: This dictation was prepared with Dragon dictation along with smaller phrase technology. Any transcriptional errors that result from this process are unintentional.

## 2018-12-06 NOTE — Progress Notes (Signed)
Dr. Clayborn Bigness in. Isosorbide and ranexa initiated.

## 2018-12-06 NOTE — Consult Note (Signed)
Reason for Consult: Unstable angina coronary disease Referring Physician: Jacalyn Lefevre primary Dr. Vianne Bulls hospitalist Cardiologist Clayborn Bigness  Mike Decker is an 77 y.o. male.  HPI: 77 year old white male presented with unstable anginal symptoms recently underwent outpatient Myoview which showed what appears to be multivessel coronary disease with ischemia patient has chronic renal sufficiency stage IV diabetes hypertension hyperlipidemia previous non-STEMI.  Has preserved left ventricular function but has had recurrent anginal symptoms.  Patient has been on nitrates and aspirin now here for further evaluation and possible cardiac cath.  Past Medical History:  Diagnosis Date  . Arthritis   . Atrioventricular canal (AVC)    irregular heart beats  . Barrett esophagus   . Cancer [facility]) 2002   prostate  . Colon polyp   . Diabetes mellitus without complication ([place])   . Diverticulosis   . Gout   . Heart disease   . Hemangioma    liver  . Hyperlipidemia   . Hypertension   . Myocardial infarct ([place])   . Ocular hypertension   . Peripheral vascular disease ([place])   . Skin cancer   . Sleep apnea   . Vitreoretinal degeneration     Past Surgical History:  Procedure Laterality Date  . CATARACT EXTRACTION  2011, 2012  . CORONARY ANGIOPLASTY WITH STENT PLACEMENT  2012  . ESOPHAGOGASTRODUODENOSCOPY (EGD) WITH PROPOFOL N/A 06/01/2016   Procedure: ESOPHAGOGASTRODUODENOSCOPY (EGD) WITH PROPOFOL;  Surgeon: Manya Silvas, MD;  Location: [facility] ENDOSCOPY;  Service: Endoscopy;  Laterality: N/A;  . HERNIA REPAIR     x2  . INTRAOCULAR LENS INSERTION    . LEFT HEART CATH AND CORONARY ANGIOGRAPHY N/A 05/09/2017   Procedure: Left Heart Cath and Coronary Angiography;  Surgeon: Yolonda Kida, MD;  Location: [facility];  Service: Cardiovascular;  Laterality: N/A;  . NOSE SURGERY     submucous resection  . PROSTATE SURGERY  2002  . ROTATOR CUFF REPAIR Right     Family History   Problem Relation Age of Onset  . Stroke Maternal Grandfather     Social History:  reports that he quit smoking about 42 years ago. His smoking use included cigarettes. He quit after 20.00 years of use. He has never used smokeless tobacco. He reports that he does not drink alcohol or use drugs.  Allergies:  Allergies  Allergen Reactions  . Gabapentin     Other reaction(s): Other (See Comments) Tremors  . Peanut-Containing Drug Products Anaphylaxis  . Penicillins Hives and Rash    Has patient had a PCN reaction causing immediate rash, facial/tongue/throat swelling, SOB or lightheadedness with hypotension: Yes Has patient had a PCN reaction causing severe rash involving mucus membranes or skin necrosis: No Has patient had a PCN reaction that required hospitalization No Has patient had a PCN reaction occurring within the last 10 years: No If all of the above answers are "NO", then may proceed with Cephalosporin use.  . Bee Venom Swelling  . Influenza Vaccines Hives  . Inh [Isoniazid] Hives  . Kenalog [Triamcinolone Acetonide] Hives  . Levaquin [Levofloxacin] Other (See Comments)    Tendon, ligament pain.   . Nalfon [Fenoprofen Calcium] Hives  . Naproxen   . Peanut Oil   . Nsaids Rash    Nalfon 600 Nalfon 600    Medications: I have reviewed the patient's current medications.  Results for orders placed or performed during the hospital encounter of 12/05/18 (from the past 48 hour(s))  Basic metabolic panel     Status:  Abnormal   Collection Time: 12/05/18 10:36 AM  Result Value Ref Range   Sodium 138 135 - 145 mmol/L   Potassium 4.3 3.5 - 5.1 mmol/L   Chloride 106 98 - 111 mmol/L   CO2 22 22 - 32 mmol/L   Glucose, Bld 78 70 - 99 mg/dL   BUN 47 (H) 8 - 23 mg/dL   Creatinine, Ser 2.11 (H) 0.61 - 1.24 mg/dL   Calcium 9.7 8.9 - 10.3 mg/dL   GFR calc non Af Amer 29 (L) >60 mL/min   GFR calc Af Amer 34 (L) >60 mL/min   Anion gap 10 5 - 15    Comment: Performed at [facility], [address] [place], [place] [address]  CBC     Status: Abnormal   Collection Time: 12/05/18 10:36 AM  Result Value Ref Range   WBC 7.3 4.0 - 10.5 K/uL   RBC 3.79 (L) 4.22 - 5.81 MIL/uL   Hemoglobin 9.4 (L) 13.0 - 17.0 g/dL   HCT 30.9 (L) 39.0 - 52.0 %   MCV 81.5 80.0 - 100.0 fL   MCH 24.8 (L) 26.0 - 34.0 pg   MCHC 30.4 30.0 - 36.0 g/dL   RDW 15.4 11.5 - 15.5 %   Platelets 317 150 - 400 K/uL   nRBC 0.0 0.0 - 0.2 %    Comment: Performed at [facility], [address] [place], [place] [address]  Troponin I - ONCE - STAT     Status: None   Collection Time: 12/05/18 10:36 AM  Result Value Ref Range   Troponin I <0.03 <0.03 ng/mL    Comment: Performed at [facility], [address] [place], [place] [address]  Protime-INR (order if Patient is taking Coumadin / Warfarin)     Status: None   Collection Time: 12/05/18 10:36 AM  Result Value Ref Range   Prothrombin Time 12.9 11.4 - 15.2 seconds   INR 0.98     Comment: Performed at [facility], [address] [place], [place] [address]  APTT     Status: None   Collection Time: 12/05/18 10:36 AM  Result Value Ref Range   aPTT 32 24 - 36 seconds    Comment: Performed at [facility], [address] [place], [place] [address]  Troponin I - Now Then Q6H     Status: None   Collection Time: 12/05/18  3:51 PM  Result Value Ref Range   Troponin I <0.03 <0.03 ng/mL    Comment: Performed at [facility], [address] [place], [place] [address]  Glucose, capillary     Status: Abnormal   Collection Time: 12/05/18  4:50 PM  Result Value Ref Range   Glucose-Capillary 134 (H) 70 - 99 mg/dL  Glucose, capillary     Status: Abnormal   Collection Time: 12/05/18  8:44 PM  Result Value Ref Range   Glucose-Capillary 189 (H) 70 - 99 mg/dL  Heparin level (unfractionated)     Status: None   Collection Time: 12/05/18  8:58 PM  Result Value Ref Range   Heparin Unfractionated 0.46  0.30 - 0.70 IU/mL    Comment: (NOTE) If heparin results are below expected values, and patient dosage has  been confirmed, suggest follow up testing of antithrombin III levels. Performed at [facility], [address] [place], [place] [address]   Troponin I - Now Then Q6H     Status: None   Collection Time: 12/05/18  8:58 PM  Result Value Ref Range   Troponin I <0.03 <0.03 ng/mL    Comment: Performed at [facility], [address] [place], [place] [address]  Troponin I - Now Then Q6H     Status: None   Collection Time: 12/06/18  3:36 AM  Result Value Ref Range   Troponin I <0.03 <0.03 ng/mL    Comment: Performed at [facility], [address] [place], [place] [address]  Basic metabolic panel     Status: Abnormal   Collection Time: 12/06/18  3:36 AM  Result Value Ref Range   Sodium 135 135 - 145 mmol/L   Potassium 4.5 3.5 - 5.1 mmol/L   Chloride 103 98 - 111 mmol/L   CO2 22 22 - 32 mmol/L   Glucose, Bld 173 (H) 70 - 99 mg/dL   BUN 49 (H) 8 - 23 mg/dL   Creatinine, Ser 2.28 (H) 0.61 - 1.24 mg/dL   Calcium 9.0 8.9 - 10.3 mg/dL   GFR calc non Af Amer 26 (L) >60 mL/min   GFR calc Af Amer 31 (L) >60 mL/min   Anion gap 10 5 - 15    Comment: Performed at [facility], [address] [place], [place] [address]  CBC     Status: Abnormal   Collection Time: 12/06/18  3:36 AM  Result Value Ref Range   WBC 9.8 4.0 - 10.5 K/uL   RBC 3.43 (L) 4.22 - 5.81 MIL/uL   Hemoglobin 8.5 (L) 13.0 - 17.0 g/dL   HCT 28.3 (L) 39.0 - 52.0 %   MCV 82.5 80.0 - 100.0 fL   MCH 24.8 (L) 26.0 - 34.0 pg   MCHC 30.0 30.0 - 36.0 g/dL   RDW 15.6 (H) 11.5 - 15.5 %   Platelets 313 150 - 400 K/uL   nRBC 0.0 0.0 - 0.2 %    Comment: Performed at [facility], [place], [place] [address]  Heparin level (unfractionated)     Status: None   Collection Time: 12/06/18  3:36 AM  Result Value Ref Range   Heparin Unfractionated 0.42 0.30 - 0.70 IU/mL     Comment: (NOTE) If heparin results are below expected values, and patient dosage has  been confirmed, suggest follow up testing of antithrombin III levels. Performed at [facility], [address] [place], [place] [address]   Glucose, capillary     Status: Abnormal   Collection Time: 12/06/18  7:41 AM  Result Value Ref Range   Glucose-Capillary 163 (H) 70 - 99 mg/dL   Comment 1 Notify RN    Comment 2 Document in Chart   Glucose, capillary     Status: Abnormal   Collection Time: 12/06/18 11:43 AM  Result Value Ref Range   Glucose-Capillary 209 (H) 70 - 99 mg/dL   Comment 1 Notify RN    Comment 2 Document in Chart     Dg Chest 2 View  Result Date: 12/05/2018 CLINICAL DATA:  Chest pain began this morning EXAM: CHEST - 2 VIEW COMPARISON:  Chest x-ray of 11/16/2017 FINDINGS: No active infiltrate or effusion is seen. Mediastinal and hilar contours appear stable. Cardiomegaly is stable. No acute bony abnormality is seen. Coronary artery stent is noted. IMPRESSION: Stable cardiomegaly.  No active lung disease. Electronically Signed   By: Ivar Drape M.D.   On: 12/05/2018 10:47    Review of Systems  Constitutional: Positive for malaise/fatigue.  HENT: Positive for congestion.  Eyes: Negative.   Respiratory: Positive for shortness of breath.   Cardiovascular: Positive for chest pain, orthopnea and PND.  Gastrointestinal: Positive for heartburn.  Genitourinary: Negative.   Musculoskeletal: Positive for back pain and myalgias.  Skin: Negative.   Neurological: Positive for dizziness and weakness.  Endo/Heme/Allergies: Negative.   Psychiatric/Behavioral: Negative.    Blood pressure (!) 133/52, pulse (!) 56, temperature 98.4 F (36.9 C), temperature source Oral, resp. rate 16, height 5\' 6"  (1.676 m), weight 86.3 kg, SpO2 98 %. Physical Exam  Nursing note and vitals reviewed. Constitutional: He is oriented to person, place, and time. He appears well-developed and  well-nourished.  HENT:  Head: Normocephalic and atraumatic.  Eyes: Pupils are equal, round, and reactive to light. Conjunctivae and EOM are normal.  Neck: Normal range of motion. Neck supple.  Cardiovascular: Normal rate, regular rhythm and normal heart sounds.  Respiratory: Effort normal and breath sounds normal.  GI: Soft. Bowel sounds are normal.  Musculoskeletal: Normal range of motion.  Neurological: He is alert and oriented to person, place, and time. He has normal reflexes.  Skin: Skin is warm and dry.  Psychiatric: He has a normal mood and affect.    Assessment/Plan: Unstable angina Coronary artery disease Diabetes Hypertension Chronic renal sufficiency stage IV Obstructive sleep apnea Hyperlipidemia Obesity Peripheral vascular disease GERD Barrett's esophagus COPD . Plan Agree with admit to telemetry Recommend intravenous heparin therapy for anticoagulation Increase nitrate therapy Add Ranexa 500 twice a day Consult nephrology pre-cardiac cath IV hydration Continue CPAP for obstructive sleep apnea Continue diabetes management and control Plan for cardiac cath on Monday with minimal contrast  Towanda Hornstein D Zaryia Markel 12/06/2018, 11:53 AM

## 2018-12-07 LAB — CBC
HCT: 28.8 % — ABNORMAL LOW (ref 39.0–52.0)
Hemoglobin: 8.7 g/dL — ABNORMAL LOW (ref 13.0–17.0)
MCH: 24.7 pg — ABNORMAL LOW (ref 26.0–34.0)
MCHC: 30.2 g/dL (ref 30.0–36.0)
MCV: 81.8 fL (ref 80.0–100.0)
Platelets: 285 10*3/uL (ref 150–400)
RBC: 3.52 MIL/uL — AB (ref 4.22–5.81)
RDW: 15.3 % (ref 11.5–15.5)
WBC: 7.6 10*3/uL (ref 4.0–10.5)
nRBC: 0 % (ref 0.0–0.2)

## 2018-12-07 LAB — GLUCOSE, CAPILLARY
GLUCOSE-CAPILLARY: 175 mg/dL — AB (ref 70–99)
Glucose-Capillary: 96 mg/dL (ref 70–99)
Glucose-Capillary: 214 mg/dL — ABNORMAL HIGH (ref 70–99)
Glucose-Capillary: 229 mg/dL — ABNORMAL HIGH (ref 70–99)

## 2018-12-07 LAB — HEPARIN LEVEL (UNFRACTIONATED): Heparin Unfractionated: 0.39 IU/mL (ref 0.30–0.70)

## 2018-12-07 MED ORDER — SODIUM CHLORIDE 0.9 % IV SOLN
INTRAVENOUS | Status: DC
  Administered 2018-12-07 – 2018-12-08 (×2): via INTRAVENOUS

## 2018-12-07 MED ORDER — HYDROCOD POLST-CPM POLST ER 10-8 MG/5ML PO SUER
5.0000 mL | Freq: Every evening | ORAL | Status: DC | PRN
  Administered 2018-12-07 – 2018-12-14 (×8): 5 mL via ORAL
  Filled 2018-12-07 (×8): qty 5

## 2018-12-07 MED ORDER — SODIUM CHLORIDE 0.9 % IV SOLN
INTRAVENOUS | Status: DC
  Administered 2018-12-07: 19:00:00 via INTRAVENOUS

## 2018-12-07 NOTE — Progress Notes (Signed)
[facility]  ROUNDING NOTE   Subjective:  No new renal function testing performed today. Admitted with unstable angina. Plans for cardiac catheterization tomorrow.   Objective:  Vital signs in last 24 hours:  Temp:  [97.6 F (36.4 C)-98.3 F (36.8 C)] 97.7 F (36.5 C) (12/29 0724) Pulse Rate:  [54-63] 62 (12/29 0724) Resp:  [18-19] 18 (12/29 0415) BP: (120-149)/(49-61) 139/61 (12/29 0724) SpO2:  [96 %-99 %] 98 % (12/29 0724) Weight:  [85.7 kg] 85.7 kg (12/29 0415)  Weight change: -0.454 kg Filed Weights   12/05/18 1557 12/06/18 0436 12/07/18 0415  Weight: 87 kg 86.3 kg 85.7 kg    Intake/Output: I/O last 3 completed shifts: In: 619.4 [I.V.:619.4] Out: 1250 [Urine:1250]   Intake/Output this shift:  Total I/O In: -  Out: 1175 [Urine:1175]  Physical Exam: General: No acute distress  Head: Normocephalic, atraumatic. Moist oral mucosal membranes  Eyes: Anicteric  Neck: Supple, trachea midline  Lungs:  Clear to auscultation, normal effort  Heart: S1S2 no rubs  Abdomen:  Soft, nontender, bowel sounds present  Extremities: No peripheral edema.  Neurologic: Awake, alert, following commands  Skin: No lesions        Basic Metabolic Panel: Recent Labs  Lab 12/05/18 1036 12/06/18 0336  NA 138 135  K 4.3 4.5  CL 106 103  CO2 22 22  GLUCOSE 78 173*  BUN 47* 49*  CREATININE 2.11* 2.28*  CALCIUM 9.7 9.0    Liver Function Tests: No results for input(s): AST, ALT, ALKPHOS, BILITOT, PROT, ALBUMIN in the last 168 hours. No results for input(s): LIPASE, AMYLASE in the last 168 hours. No results for input(s): AMMONIA in the last 168 hours.  CBC: Recent Labs  Lab 12/05/18 1036 12/06/18 0336 12/07/18 0447  WBC 7.3 9.8 7.6  HGB 9.4* 8.5* 8.7*  HCT 30.9* 28.3* 28.8*  MCV 81.5 82.5 81.8  PLT 317 313 285    Cardiac Enzymes: Recent Labs  Lab 12/05/18 1036 12/05/18 1551 12/05/18 2058 12/06/18 0336  TROPONINI <0.03 <0.03 <0.03 <0.03     BNP: Invalid input(s): POCBNP  CBG: Recent Labs  Lab 12/06/18 1143 12/06/18 1627 12/06/18 2116 12/07/18 0726 12/07/18 1132  GLUCAP 209* 207* 180* 96 175*    Microbiology: Results for orders placed or performed during the hospital encounter of 05/06/17  Blood Culture (routine x 2)     Status: None   Collection Time: 05/06/17  7:02 PM  Result Value Ref Range Status   Specimen Description BLOOD RIGHT HAND  Final   Special Requests   Final    BOTTLES DRAWN AEROBIC AND ANAEROBIC Blood Culture adequate volume   Culture NO GROWTH 5 DAYS  Final   Report Status 05/11/2017 FINAL  Final  Blood Culture (routine x 2)     Status: None   Collection Time: 05/06/17  7:02 PM  Result Value Ref Range Status   Specimen Description BLOOD LEFT AC  Final   Special Requests   Final    BOTTLES DRAWN AEROBIC AND ANAEROBIC Blood Culture adequate volume   Culture NO GROWTH 5 DAYS  Final   Report Status 05/11/2017 FINAL  Final  Urine culture     Status: None   Collection Time: 05/06/17  7:18 PM  Result Value Ref Range Status   Specimen Description URINE, RANDOM  Final   Special Requests NONE  Final   Culture   Final    NO GROWTH Performed at [facility], [place] [address], [place], [place]  [address]    Report Status 05/08/2017 FINAL  Final  Gastrointestinal Panel by PCR , Stool     Status: Abnormal   Collection Time: 05/07/17  4:44 AM  Result Value Ref Range Status   Campylobacter species NOT DETECTED NOT DETECTED Final   Plesimonas shigelloides NOT DETECTED NOT DETECTED Final   Salmonella species DETECTED (A) NOT DETECTED Final    Comment: RESULT CALLED TO, READ BACK BY AND VERIFIED WITH: BETH BUONO 05/07/17 0642 ALV    Yersinia enterocolitica NOT DETECTED NOT DETECTED Final   Vibrio species NOT DETECTED NOT DETECTED Final   Vibrio cholerae NOT DETECTED NOT DETECTED Final   Enteroaggregative E coli (EAEC) NOT DETECTED NOT DETECTED Final   Enteropathogenic E coli (EPEC) NOT  DETECTED NOT DETECTED Final   Enterotoxigenic E coli (ETEC) NOT DETECTED NOT DETECTED Final   Shiga like toxin producing E coli (STEC) NOT DETECTED NOT DETECTED Final   E. coli O157 NOT DETECTED NOT DETECTED Final   Shigella/Enteroinvasive E coli (EIEC) NOT DETECTED NOT DETECTED Final   Cryptosporidium NOT DETECTED NOT DETECTED Final   Cyclospora cayetanensis NOT DETECTED NOT DETECTED Final   Entamoeba histolytica NOT DETECTED NOT DETECTED Final   Giardia lamblia NOT DETECTED NOT DETECTED Final   Adenovirus F40/41 NOT DETECTED NOT DETECTED Final   Astrovirus NOT DETECTED NOT DETECTED Final   Norovirus GI/GII NOT DETECTED NOT DETECTED Final   Rotavirus A NOT DETECTED NOT DETECTED Final   Sapovirus (I, II, IV, and V) NOT DETECTED NOT DETECTED Final  C difficile quick scan w PCR reflex     Status: None   Collection Time: 05/07/17  4:44 AM  Result Value Ref Range Status   C Diff antigen NEGATIVE NEGATIVE Final   C Diff toxin NEGATIVE NEGATIVE Final   C Diff interpretation No C. difficile detected.  Final  MRSA PCR Screening     Status: None   Collection Time: 05/07/17  6:24 AM  Result Value Ref Range Status   MRSA by PCR NEGATIVE NEGATIVE Final    Comment:        The GeneXpert MRSA Assay (FDA approved for NASAL specimens only), is one component of a comprehensive MRSA colonization surveillance program. It is not intended to diagnose MRSA infection nor to guide or monitor treatment for MRSA infections.     Coagulation Studies: Recent Labs    12/05/18 1036  LABPROT 12.9  INR 0.98    Urinalysis: No results for input(s): COLORURINE, LABSPEC, PHURINE, GLUCOSEU, HGBUR, BILIRUBINUR, KETONESUR, PROTEINUR, UROBILINOGEN, NITRITE, LEUKOCYTESUR in the last 72 hours.  Invalid input(s): APPERANCEUR    Imaging: No results found.   Medications:   . sodium chloride    . heparin 1,000 Units/hr (12/06/18 1441)   . allopurinol  200 mg Oral BID  . amLODipine  5 mg Oral BID  .  aspirin  81 mg Oral Daily  . citalopram  10 mg Oral Daily  . clopidogrel  75 mg Oral Daily  . docusate sodium  100 mg Oral BID  . fluticasone  2 spray Each Nare Daily  . insulin aspart  0-9 Units Subcutaneous TID WC  . insulin glargine  62 Units Subcutaneous QHS  . isosorbide mononitrate  60 mg Oral BID  . methocarbamol  500 mg Oral BID  . metoprolol tartrate  75 mg Oral BID  . mometasone-formoterol  2 puff Inhalation BID  . ranolazine  500 mg Oral BID  . rosuvastatin  40 mg Oral QPM   acetaminophen **  OR** acetaminophen, bisacodyl, nitroGLYCERIN, ondansetron **OR** ondansetron (ZOFRAN) IV, traZODone  Assessment/ Plan:  [name] is a 77 y.o. white male with prostate cancer, diabetes mellitus type 2, diverticulosis, gout, coronary artery disease with 75% RCA lesion, hyperlipidemia, hypertension, peripheral vascular disease, skin cancer, obstructive sleep apnea was admitted with unstable angina.    1.  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 GFR appears to be at its baseline.   - Pt is high risk for contrast induced nephropathy but pt with recent NSTEMI and now with unstable angina.  Therefore he is in need of catherization.   -We will go ahead and start the patient on 0.9 normal saline starting at 7 PM tonight at 100 cc/h.  We will need to monitor closely for signs of volume overload.  Case discussed with Dr. Georgia Dom.  He states that he may perform a staged cardiac catheterization if intervention is needed.  2.  Hypertension.  Continue amlodipine, metoprolol.  3.  Anemia chronic kidney disease.  Hemoglobin currently 8.7.  Consider Epogen as an outpatient.  4.  Gout.   Continue allopurinol 200 mg p.o. twice daily    LOS: 2 Josel Keo 12/29/20191:25 PM

## 2018-12-07 NOTE — Progress Notes (Signed)
[facility] NAME: Mike Decker    MR#:  [id number]  DATE OF BIRTH:  [birth year]  SUBJECTIVE:  Patient has no complaints. REVIEW OF SYSTEMS:  CONSTITUTIONAL: No fever, fatigue or weakness.  EYES: No blurred or double vision.  EARS, NOSE, AND THROAT: No tinnitus or ear pain.  RESPIRATORY: No cough, shortness of breath, wheezing or hemoptysis.  CARDIOVASCULAR: No chest pain, orthopnea, edema.  GASTROINTESTINAL: No nausea, vomiting, diarrhea or abdominal pain.  GENITOURINARY: No dysuria, hematuria.  ENDOCRINE: No polyuria, nocturia,  HEMATOLOGY: No anemia, easy bruising or bleeding SKIN: No rash or lesion. MUSCULOSKELETAL: No joint pain or arthritis.   NEUROLOGIC: No tingling, numbness, weakness.  PSYCHIATRY: No anxiety or depression.   ROS  DRUG ALLERGIES:   Allergies  Allergen Reactions  . Gabapentin     Other reaction(s): Other (See Comments) Tremors  . Peanut-Containing Drug Products Anaphylaxis  . Penicillins Hives and Rash    Has patient had a PCN reaction causing immediate rash, facial/tongue/throat swelling, SOB or lightheadedness with hypotension: Yes Has patient had a PCN reaction causing severe rash involving mucus membranes or skin necrosis: No Has patient had a PCN reaction that required hospitalization No Has patient had a PCN reaction occurring within the last 10 years: No If all of the above answers are "NO", then may proceed with Cephalosporin use.  . Bee Venom Swelling  . Influenza Vaccines Hives  . Inh [Isoniazid] Hives  . Kenalog [Triamcinolone Acetonide] Hives  . Levaquin [Levofloxacin] Other (See Comments)    Tendon, ligament pain.   . Nalfon [Fenoprofen Calcium] Hives  . Naproxen   . Peanut Oil   . Nsaids Rash    Nalfon 600 Nalfon 600    VITALS:  Blood pressure 139/61, pulse 62, temperature 97.7 F (36.5 C), temperature source Oral, resp. rate 18, height 5\' 6"  (1.676 m), weight 85.7 kg, SpO2  98 %.  PHYSICAL EXAMINATION:  GENERAL:  77 y.o.-year-old patient lying in the bed with no acute distress.  EYES: Pupils equal, round, reactive to light and accommodation. No scleral icterus. Extraocular muscles intact.  HEENT: Head atraumatic, normocephalic. Oropharynx and nasopharynx clear.  NECK:  Supple, no jugular venous distention. No thyroid enlargement, no tenderness.  LUNGS: Normal breath sounds bilaterally, no wheezing, rales,rhonchi or crepitation. No use of accessory muscles of respiration.  CARDIOVASCULAR: S1, S2 normal. No murmurs, rubs, or gallops.  ABDOMEN: Soft, nontender, nondistended. Bowel sounds present. No organomegaly or mass.  EXTREMITIES: No pedal edema, cyanosis, or clubbing.  NEUROLOGIC: Cranial nerves II through XII are intact. Muscle strength 5/5 in all extremities. Sensation intact. Gait not checked.  PSYCHIATRIC: The patient is alert and oriented x 3.  SKIN: No obvious rash, lesion, or ulcer.   Physical Exam LABORATORY PANEL:   CBC Recent Labs  Lab 12/07/18 0447  WBC 7.6  HGB 8.7*  HCT 28.8*  PLT 285   ------------------------------------------------------------------------------------------------------------------  Chemistries  Recent Labs  Lab 12/06/18 0336  NA 135  K 4.5  CL 103  CO2 22  GLUCOSE 173*  BUN 49*  CREATININE 2.28*  CALCIUM 9.0   ------------------------------------------------------------------------------------------------------------------  Cardiac Enzymes Recent Labs  Lab 12/05/18 2058 12/06/18 0336  TROPONINI <0.03 <0.03   ------------------------------------------------------------------------------------------------------------------  RADIOLOGY:  No results found.  ASSESSMENT AND PLAN:  *Acute unstable angina  Resolved on current regiment  Noted abnormal stress test on Thursday Continue ACS protocol, aspirin, Plavix, heparin drip, nitrates as needed, Ranexa, beta-blocker therapy, statin therapy, IV  morphine for breakthrough pain, supplemental oxygen, cardiology input appreciated-for heart catheterization tomorrow.  *CKD stage III BL Cr around 2.1 Nephrology consulted given concern for possible contrast-induced nephropathy after heart catheterization  *Chronic diabetes mellitus type 2  Continue sliding scale insulin with Accu-Cheks per routine, cardiac/carbohydrate consistent diet  *Anemia of chronic disease.  Iron deficiency.  May follow-up hematologist as outpatient. Consider Epogen as an outpatient per Dr. Holley Raring.  *Chronic obstructive sleep apnea CPAP at at bedtime/as needed  Hypertension.  Continue Norvasc and Lopressor.  All the records are reviewed and case discussed with Care Management/Social Workerr. Management plans discussed with the patient, family and they are in agreement.  CODE STATUS: full  TOTAL TIME TAKING CARE OF THIS PATIENT: 32 minutes.    POSSIBLE D/C IN 1-2 DAYS, DEPENDING ON CLINICAL CONDITION.   Demetrios Loll M.D on 12/07/2018   Between 7am to 6pm - Pager - [phone]  After 6pm go to [email or web address] - password [email or web address] [facility]  Office  [phone]  CC: Primary care physician; Cletis Athens, MD  Note: This dictation was prepared with Dragon dictation along with smaller phrase technology. Any transcriptional errors that result from this process are unintentional.

## 2018-12-07 NOTE — Progress Notes (Signed)
ANTICOAGULATION CONSULT NOTE - Initial Consult  Pharmacy Consult for heparin Indication: chest pain/ACS  Allergies  Allergen Reactions  . Gabapentin     Other reaction(s): Other (See Comments) Tremors  . Peanut-Containing Drug Products Anaphylaxis  . Penicillins Hives and Rash    Has patient had a PCN reaction causing immediate rash, facial/tongue/throat swelling, SOB or lightheadedness with hypotension: Yes Has patient had a PCN reaction causing severe rash involving mucus membranes or skin necrosis: No Has patient had a PCN reaction that required hospitalization No Has patient had a PCN reaction occurring within the last 10 years: No If all of the above answers are "NO", then may proceed with Cephalosporin use.  . Bee Venom Swelling  . Influenza Vaccines Hives  . Inh [Isoniazid] Hives  . Kenalog [Triamcinolone Acetonide] Hives  . Levaquin [Levofloxacin] Other (See Comments)    Tendon, ligament pain.   . Nalfon [Fenoprofen Calcium] Hives  . Naproxen   . Peanut Oil   . Nsaids Rash    Nalfon 600 Nalfon 600    Patient Measurements: Height: 5\' 6"  (167.6 cm) Weight: 190 lb 4.8 oz (86.3 kg) IBW/kg (Calculated) : 63.8 Heparin Dosing Weight: 81.7 kg  Vital Signs: Temp: 97.6 F (36.4 C) (12/29 0415) BP: 120/49 (12/29 0415) Pulse Rate: 63 (12/29 0415)  Labs: Recent Labs    12/05/18 1036 12/05/18 1551 12/05/18 2058 12/06/18 0336 12/07/18 0447  HGB 9.4*  --   --  8.5* 8.7*  HCT 30.9*  --   --  28.3* 28.8*  PLT 317  --   --  313 285  APTT 32  --   --   --   --   LABPROT 12.9  --   --   --   --   INR 0.98  --   --   --   --   HEPARINUNFRC  --   --  0.46 0.42 0.39  CREATININE 2.11*  --   --  2.28*  --   TROPONINI <0.03 <0.03 <0.03 <0.03  --     Estimated Creatinine Clearance: 27.5 mL/min (A) (by C-G formula based on SCr of 2.28 mg/dL (H)).   Medical History: Past Medical History:  Diagnosis Date  . Arthritis   . Atrioventricular canal (AVC)    irregular heart  beats  . Barrett esophagus   . Cancer [facility]) 2002   prostate  . Colon polyp   . Diabetes mellitus without complication ([place])   . Diverticulosis   . Gout   . Heart disease   . Hemangioma    liver  . Hyperlipidemia   . Hypertension   . Myocardial infarct ([place])   . Ocular hypertension   . Peripheral vascular disease ([place])   . Skin cancer   . Sleep apnea   . Vitreoretinal degeneration     Medications: as ordered  Assessment: 77 yom cc chest pain with PMH HTN, HLD, CAD on DAPT, PVD. PTA is not completed at this time. CBC, BMP, PT/INR are ordered, resulted and appropriate. Pharmacy has been consulted to dose heparin for ACS.   12/27 21:00 HL 0.46   12/28 03:36 HL 0.42, 2nd therapeutic level 12/29 04:47 HL 0.39  Goal of Therapy:  Heparin level 0.3-0.7 units/ml Monitor platelets by anticoagulation protocol: Yes   Plan: Will continue with current rate of 1000 units/hr. Next Heparin level with AM labs. Daily CBC while on Heparin drip.  Paulina Fusi, PharmD, BCPS 12/07/2018 5:38 AM

## 2018-12-08 ENCOUNTER — Encounter
Admission: EM | Disposition: A | Discharge status: Home Health | Payer: Self-pay | Source: Home / Self Care | Attending: Internal Medicine

## 2018-12-08 DIAGNOSIS — R0602 Shortness of breath: Secondary | ICD-10-CM

## 2018-12-08 DIAGNOSIS — I200 Unstable angina: Secondary | ICD-10-CM

## 2018-12-08 DIAGNOSIS — D631 Anemia in chronic kidney disease: Secondary | ICD-10-CM

## 2018-12-08 DIAGNOSIS — D500 Iron deficiency anemia secondary to blood loss (chronic): Secondary | ICD-10-CM

## 2018-12-08 DIAGNOSIS — I129 Hypertensive chronic kidney disease with stage 1 through stage 4 chronic kidney disease, or unspecified chronic kidney disease: Secondary | ICD-10-CM

## 2018-12-08 DIAGNOSIS — E1122 Type 2 diabetes mellitus with diabetic chronic kidney disease: Secondary | ICD-10-CM

## 2018-12-08 DIAGNOSIS — I2510 Atherosclerotic heart disease of native coronary artery without angina pectoris: Secondary | ICD-10-CM

## 2018-12-08 DIAGNOSIS — I252 Old myocardial infarction: Secondary | ICD-10-CM

## 2018-12-08 DIAGNOSIS — N184 Chronic kidney disease, stage 4 (severe): Secondary | ICD-10-CM

## 2018-12-08 HISTORY — PX: LEFT HEART CATH AND CORONARY ANGIOGRAPHY: CATH118249

## 2018-12-08 LAB — GLUCOSE, CAPILLARY
GLUCOSE-CAPILLARY: 152 mg/dL — AB (ref 70–99)
Glucose-Capillary: 129 mg/dL — ABNORMAL HIGH (ref 70–99)
Glucose-Capillary: 103 mg/dL — ABNORMAL HIGH (ref 70–99)
Glucose-Capillary: 63 mg/dL — ABNORMAL LOW (ref 70–99)
Glucose-Capillary: 65 mg/dL — ABNORMAL LOW (ref 70–99)
Glucose-Capillary: 161 mg/dL — ABNORMAL HIGH (ref 70–99)
Glucose-Capillary: 188 mg/dL — ABNORMAL HIGH (ref 70–99)

## 2018-12-08 LAB — CBC
HCT: 25.4 % — ABNORMAL LOW (ref 39.0–52.0)
Hemoglobin: 7.5 g/dL — ABNORMAL LOW (ref 13.0–17.0)
MCH: 24.4 pg — ABNORMAL LOW (ref 26.0–34.0)
MCHC: 29.5 g/dL — ABNORMAL LOW (ref 30.0–36.0)
MCV: 82.5 fL (ref 80.0–100.0)
NRBC: 0 % (ref 0.0–0.2)
Platelets: 247 10*3/uL (ref 150–400)
RBC: 3.08 MIL/uL — ABNORMAL LOW (ref 4.22–5.81)
RDW: 15.5 % (ref 11.5–15.5)
WBC: 8.4 10*3/uL (ref 4.0–10.5)

## 2018-12-08 LAB — HEPARIN LEVEL (UNFRACTIONATED)
Heparin Unfractionated: 0.27 IU/mL — ABNORMAL LOW (ref 0.30–0.70)
Heparin Unfractionated: 0.55 IU/mL (ref 0.30–0.70)

## 2018-12-08 LAB — HEMOGLOBIN: HEMOGLOBIN: 8.4 g/dL — AB (ref 13.0–17.0)

## 2018-12-08 SURGERY — LEFT HEART CATH AND CORONARY ANGIOGRAPHY
Anesthesia: Moderate Sedation

## 2018-12-08 MED ORDER — DIPHENHYDRAMINE HCL 25 MG PO CAPS
25.0000 mg | ORAL_CAPSULE | Freq: Once | ORAL | Status: AC
  Administered 2018-12-08: 25 mg via ORAL
  Filled 2018-12-08: qty 1

## 2018-12-08 MED ORDER — HEPARIN (PORCINE) IN NACL 1000-0.9 UT/500ML-% IV SOLN
INTRAVENOUS | Status: AC
  Filled 2018-12-08: qty 1000

## 2018-12-08 MED ORDER — SODIUM CHLORIDE 0.9 % IV SOLN
200.0000 mg | Freq: Every day | INTRAVENOUS | Status: DC
  Administered 2018-12-08 – 2018-12-10 (×3): 200 mg via INTRAVENOUS
  Filled 2018-12-08 (×4): qty 10

## 2018-12-08 MED ORDER — FENTANYL CITRATE (PF) 100 MCG/2ML IJ SOLN
INTRAMUSCULAR | Status: DC | PRN
  Administered 2018-12-08: 25 ug via INTRAVENOUS

## 2018-12-08 MED ORDER — SODIUM CHLORIDE 0.9% FLUSH: 3.0000 mL | Freq: Two times a day (BID) | INTRAVENOUS | Status: DC

## 2018-12-08 MED ORDER — FENTANYL CITRATE (PF) 100 MCG/2ML IJ SOLN
INTRAMUSCULAR | Status: AC
  Filled 2018-12-08: qty 2

## 2018-12-08 MED ORDER — MIDAZOLAM HCL 2 MG/2ML IJ SOLN
INTRAMUSCULAR | Status: DC | PRN
  Administered 2018-12-08: 1 mg via INTRAVENOUS

## 2018-12-08 MED ORDER — SODIUM CHLORIDE 0.9 % WEIGHT BASED INFUSION: 3.0000 mL/kg/h | INTRAVENOUS | Status: DC

## 2018-12-08 MED ORDER — SODIUM CHLORIDE 0.9% FLUSH: 3.0000 mL | INTRAVENOUS | Status: DC | PRN

## 2018-12-08 MED ORDER — DEXTROSE-NACL 5-0.45 % IV SOLN
INTRAVENOUS | Status: DC
  Administered 2018-12-08: 16:00:00 via INTRAVENOUS

## 2018-12-08 MED ORDER — MIDAZOLAM HCL 2 MG/2ML IJ SOLN
INTRAMUSCULAR | Status: AC
  Filled 2018-12-08: qty 2

## 2018-12-08 MED ORDER — SODIUM CHLORIDE 0.9 % WEIGHT BASED INFUSION: 1.0000 mL/kg/h | INTRAVENOUS | Status: DC

## 2018-12-08 MED ORDER — SODIUM CHLORIDE 0.9 % IV SOLN: 250.0000 mL | INTRAVENOUS | Status: DC | PRN

## 2018-12-08 MED ORDER — DEXTROSE 50 % IV SOLN
INTRAVENOUS | Status: AC
  Filled 2018-12-08: qty 50

## 2018-12-08 MED ORDER — DEXTROSE 50 % IV SOLN
1.0000 | Freq: Once | INTRAVENOUS | Status: AC
  Administered 2018-12-08: 50 mL via INTRAVENOUS

## 2018-12-08 MED ORDER — ASPIRIN 81 MG PO CHEW: 81.0000 mg | CHEWABLE_TABLET | ORAL | Status: DC

## 2018-12-08 MED ORDER — IOPAMIDOL (ISOVUE-300) INJECTION 61%
INTRAVENOUS | Status: DC | PRN
  Administered 2018-12-08: 75 mL via INTRA_ARTERIAL

## 2018-12-08 SURGICAL SUPPLY — 9 items
CATH INFINITI 5FR ANG PIGTAIL (CATHETERS) ×2 IMPLANT
CATH INFINITI 5FR JL4 (CATHETERS) ×2 IMPLANT
CATH INFINITI JR4 5F (CATHETERS) ×2 IMPLANT
DEVICE CLOSURE MYNXGRIP 5F (Vascular Products) ×2 IMPLANT
KIT MANI 3VAL PERCEP (MISCELLANEOUS) ×2 IMPLANT
NEEDLE PERC 18GX7CM (NEEDLE) ×2 IMPLANT
PACK CARDIAC CATH (CUSTOM PROCEDURE TRAY) ×2 IMPLANT
SHEATH AVANTI 5FR X 11CM (SHEATH) ×2 IMPLANT
WIRE GUIDERIGHT .035X150 (WIRE) ×2 IMPLANT

## 2018-12-08 NOTE — Progress Notes (Signed)
Pt has multiple fluid orders after cardiac cath and had a blood sugar in the 60's down there, spoke with Dr. Posey Pronto, he gave orders to D/C all Normal saline and keep D5 1/2 NS , but reduce rate to 35ml/hr. Will fix orders and continue to monitor. Conley Simmonds, RN, BSN

## 2018-12-08 NOTE — Care Management Important Message (Signed)
Copy of signed IM left with patient in room.  

## 2018-12-08 NOTE — Consult Note (Signed)
Hematology/Oncology Consult note [facility] [phone] [phone]  Patient Care Team: Cletis Athens, MD as PCP - General (Cardiology) Cletis Athens, MD as Referring Physician (Cardiology) Christene Lye, MD (General Surgery)   Name of the patient: Mike Decker  [id number]  05-06-1941   Date of visit: 12/08/18 REASON FOR COSULTATION: Anemia  History of presenting illness-  77 y.o. male with PMH listed at below including history of non-STEMI, hyperlipidemia, hypertension, diabetes, CKD, Barrett's esophagus status post post multiple EGD with ablation who presents to ER for evaluation of chest pain.  Seen by cardiologist On nitrate, aspirin, heparin drip, he is going to have cardiac cath this afternoon. Heme-onc was consulted for evaluation of progressively worsened anemia.  Patient was sitting the bed with no acute distress.  Wife  and other family members at bedside.  He denies any chest pain for now.  Fatigue is at baseline.  Reports stool being dark.  Denies any epigastric discomfort.  Last EGD was done in June 2019 at [facility].  No recent colonoscopy. Denies hematochezia, hematuria, hematemesis, epistaxis.    Review of Systems  Constitutional: Positive for fatigue. Negative for appetite change, chills and fever.  HENT:   Negative for hearing loss and voice change.   Eyes: Negative for eye problems and icterus.  Respiratory: Positive for shortness of breath. Negative for chest tightness and cough.   Cardiovascular: Positive for chest pain. Negative for leg swelling.  Gastrointestinal: Negative for abdominal distention and abdominal pain.       Dark stool  Endocrine: Negative for hot flashes.  Genitourinary: Negative for difficulty urinating, dysuria and frequency.   Musculoskeletal: Negative for arthralgias.  Skin: Negative for itching and rash.  Neurological: Negative for light-headedness and numbness.  Hematological: Negative  for adenopathy. Does not bruise/bleed easily.  Psychiatric/Behavioral: Negative for confusion.    Allergies  Allergen Reactions  . Gabapentin     Other reaction(s): Other (See Comments) Tremors  . Peanut-Containing Drug Products Anaphylaxis  . Penicillins Hives and Rash    Has patient had a PCN reaction causing immediate rash, facial/tongue/throat swelling, SOB or lightheadedness with hypotension: Yes Has patient had a PCN reaction causing severe rash involving mucus membranes or skin necrosis: No Has patient had a PCN reaction that required hospitalization No Has patient had a PCN reaction occurring within the last 10 years: No If all of the above answers are "NO", then may proceed with Cephalosporin use.  . Bee Venom Swelling  . Influenza Vaccines Hives  . Inh [Isoniazid] Hives  . Kenalog [Triamcinolone Acetonide] Hives  . Levaquin [Levofloxacin] Other (See Comments)    Tendon, ligament pain.   . Nalfon [Fenoprofen Calcium] Hives  . Naproxen   . Peanut Oil   . Nsaids Rash    Nalfon 600 Nalfon 600    Patient Active Problem List   Diagnosis Date Noted  . Chest pain 11/14/2018  . CAD (coronary artery disease) 02/27/2018  . Sacroiliac joint pain 10/08/2017  . Pain in limb 09/24/2017  . Neuropathy 07/24/2017  . NSTEMI (non-ST elevated myocardial infarction) ([place]) 05/06/2017  . PAD (peripheral artery disease) ([place]) 05/01/2017  . History of stroke 01/02/2017  . Barrett's esophagus 10/02/2016  . Carotid stenosis 10/02/2016  . Essential hypertension 10/02/2016  . Hyperlipidemia 10/02/2016  . Diabetes ([place]) 10/02/2016  . 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 Uncontrolled type 2 diabetes mellitus with hyperglycemia, with long-term current use of insulin ([place]) 04/10/2016  . TIA (  transient ischemic attack) 10/08/2015     Past Medical History:  Diagnosis Date  . Arthritis   . Atrioventricular canal (AVC)    irregular heart beats  . Barrett  esophagus   . Cancer [facility]) 2002   prostate  . Colon polyp   . Diabetes mellitus without complication ([place])   . Diverticulosis   . Gout   . Heart disease   . Hemangioma    liver  . Hyperlipidemia   . Hypertension   . Myocardial infarct ([place])   . Ocular hypertension   . Peripheral vascular disease ([place])   . Skin cancer   . Sleep apnea   . Vitreoretinal degeneration      Past Surgical History:  Procedure Laterality Date  . CATARACT EXTRACTION  2011, 2012  . CORONARY ANGIOPLASTY WITH STENT PLACEMENT  2012  . ESOPHAGOGASTRODUODENOSCOPY (EGD) WITH PROPOFOL N/A 06/01/2016   Procedure: ESOPHAGOGASTRODUODENOSCOPY (EGD) WITH PROPOFOL;  Surgeon: Manya Silvas, MD;  Location: [facility] ENDOSCOPY;  Service: Endoscopy;  Laterality: N/A;  . HERNIA REPAIR     x2  . INTRAOCULAR LENS INSERTION    . LEFT HEART CATH AND CORONARY ANGIOGRAPHY N/A 05/09/2017   Procedure: Left Heart Cath and Coronary Angiography;  Surgeon: Yolonda Kida, MD;  Location: [facility] LAB;  Service: Cardiovascular;  Laterality: N/A;  . NOSE SURGERY     submucous resection  . PROSTATE SURGERY  2002  . ROTATOR CUFF REPAIR Right     Social History   Socioeconomic History  . Marital status: Married    Spouse name: Not on file  . Number of children: Not on file  . Years of education: Not on file  . Highest education level: Not on file  Occupational History  . Not on file  Social Needs  . Financial resource strain: Not on file  . Food insecurity:    Worry: Not on file    Inability: Not on file  . Transportation needs:    Medical: Not on file    Non-medical: Not on file  Tobacco Use  . Smoking status: Former Smoker    Years: 20.00    Types: Cigarettes    Last attempt to quit: 12/10/1976    Years since quitting: 42.0  . Smokeless tobacco: Never Used  Substance and Sexual Activity  . Alcohol use: No  . Drug use: No  . Sexual activity: Not on file  Lifestyle  . Physical activity:    Days per week:  Not on file    Minutes per session: Not on file  . Stress: Not on file  Relationships  . Social connections:    Talks on phone: Not on file    Gets together: Not on file    Attends religious service: Not on file    Active member of club or organization: Not on file    Attends meetings of clubs or organizations: Not on file    Relationship status: Not on file  . Intimate partner violence:    Fear of current or ex partner: Not on file    Emotionally abused: Not on file    Physically abused: Not on file    Forced sexual activity: Not on file  Other Topics Concern  . Not on file  Social History Narrative  . Not on file     Family History  Problem Relation Age of Onset  . Stroke Maternal Grandfather      Current Facility-Administered Medications:  .  0.9 %  sodium chloride infusion, , Intravenous, Continuous, Demetrios Loll, MD, Last Rate: 125 mL/hr at 12/08/18 0334 .  0.9 %  sodium chloride infusion, , Intravenous, Continuous, Lateef, Munsoor, MD, Stopped at 12/07/18 2139 .  acetaminophen (TYLENOL) tablet 650 mg, 650 mg, Oral, Q6H PRN, 650 mg at 12/06/18 0811 **OR** acetaminophen (TYLENOL) suppository 650 mg, 650 mg, Rectal, Q6H PRN, Epifanio Lesches, MD .  allopurinol (ZYLOPRIM) tablet 200 mg, 200 mg, Oral, BID, Epifanio Lesches, MD, 200 mg at 12/08/18 0910 .  amLODipine (NORVASC) tablet 5 mg, 5 mg, Oral, BID, Epifanio Lesches, MD, 5 mg at 12/08/18 0909 .  aspirin chewable tablet 81 mg, 81 mg, Oral, Daily, Epifanio Lesches, MD, 81 mg at 12/08/18 0909 .  bisacodyl (DULCOLAX) EC tablet 5 mg, 5 mg, Oral, Daily PRN, Epifanio Lesches, MD .  chlorpheniramine-HYDROcodone (TUSSIONEX) 10-8 MG/5ML suspension 5 mL, 5 mL, Oral, QHS PRN, Demetrios Loll, MD, 5 mL at 12/07/18 2136 .  citalopram (CELEXA) tablet 10 mg, 10 mg, Oral, Daily, Epifanio Lesches, MD, 10 mg at 12/08/18 0909 .  clopidogrel (PLAVIX) tablet 75 mg, 75 mg, Oral, Daily, Epifanio Lesches, MD, 75 mg at 12/08/18  0909 .  docusate sodium (COLACE) capsule 100 mg, 100 mg, Oral, BID, Epifanio Lesches, MD, 100 mg at 12/08/18 0909 .  fluticasone (FLONASE) 50 MCG/ACT nasal spray 2 spray, 2 spray, Each Nare, Daily, Epifanio Lesches, MD, 2 spray at 12/08/18 0910 .  heparin ADULT infusion 100 units/mL (25000 units/216mL sodium chloride 0.45%), 1,100 Units/hr, Intravenous, Continuous, Demetrios Loll, MD, Last Rate: 11 mL/hr at 12/08/18 0526, 1,100 Units/hr at 12/08/18 0526 .  insulin aspart (novoLOG) injection 0-9 Units, 0-9 Units, Subcutaneous, TID WC, Epifanio Lesches, MD, 1 Units at 12/08/18 0909 .  insulin glargine (LANTUS) injection 62 Units, 62 Units, Subcutaneous, QHS, Epifanio Lesches, MD, 62 Units at 12/07/18 2137 .  isosorbide mononitrate (IMDUR) 24 hr tablet 60 mg, 60 mg, Oral, BID, Callwood, Dwayne D, MD, 60 mg at 12/08/18 0909 .  methocarbamol (ROBAXIN) tablet 500 mg, 500 mg, Oral, BID, Epifanio Lesches, MD, 500 mg at 12/08/18 0910 .  metoprolol tartrate (LOPRESSOR) tablet 75 mg, 75 mg, Oral, BID, Epifanio Lesches, MD, 75 mg at 12/08/18 0908 .  mometasone-formoterol (DULERA) 200-5 MCG/ACT inhaler 2 puff, 2 puff, Inhalation, BID, Epifanio Lesches, MD, 2 puff at 12/08/18 0911 .  nitroGLYCERIN (NITROSTAT) SL tablet 0.4 mg, 0.4 mg, Sublingual, Q5 min PRN, Salary, Montell D, MD .  ondansetron (ZOFRAN) tablet 4 mg, 4 mg, Oral, Q6H PRN **OR** ondansetron (ZOFRAN) injection 4 mg, 4 mg, Intravenous, Q6H PRN, Epifanio Lesches, MD .  ranolazine (RANEXA) 12 hr tablet 500 mg, 500 mg, Oral, BID, Callwood, Dwayne D, MD, 500 mg at 12/08/18 0910 .  rosuvastatin (CRESTOR) tablet 40 mg, 40 mg, Oral, QPM, Epifanio Lesches, MD, 40 mg at 12/07/18 1834 .  traZODone (DESYREL) tablet 50 mg, 50 mg, Oral, QHS PRN, Lance Coon, MD, 50 mg at 12/07/18 2136   Physical exam:  Vitals:   12/07/18 1925 12/08/18 0529 12/08/18 0530 12/08/18 0721  BP: (!) 130/46  (!) 108/50 (!) 114/42  Pulse: 60  (!) 57 61   Resp: 20  20 19   Temp: 98.7 F (37.1 C)  97.6 F (36.4 C) 98.4 F (36.9 C)  TempSrc: Oral  Oral Oral  SpO2: 98%  98% 100%  Weight:  192 lb (87.1 kg)    Height:       Physical Exam  Constitutional: He is oriented to person, place, and time. No distress.  HENT:  Head: Normocephalic and atraumatic.  Mouth/Throat: No oropharyngeal exudate.  Eyes: Pupils are equal, round, and reactive to light. EOM are normal. No scleral icterus.  Neck: Normal range of motion. Neck supple.  Cardiovascular: Normal rate and regular rhythm.  Murmur heard. Pulmonary/Chest: Effort normal. No respiratory distress. He has no rales.  Abdominal: Soft. He exhibits no distension.  Musculoskeletal: Normal range of motion.        General: No edema.  Neurological: He is alert and oriented to person, place, and time. He exhibits normal muscle tone.  Skin: Skin is warm and dry. He is not diaphoretic. No erythema. There is pallor.  Psychiatric: Affect normal.        CMP Latest Ref Rng & Units 12/06/2018  Glucose 70 - 99 mg/dL 173(H)  BUN 8 - 23 mg/dL 49(H)  Creatinine 0.61 - 1.24 mg/dL 2.28(H)  Sodium 135 - 145 mmol/L 135  Potassium 3.5 - 5.1 mmol/L 4.5  Chloride 98 - 111 mmol/L 103  CO2 22 - 32 mmol/L 22  Calcium 8.9 - 10.3 mg/dL 9.0  Total Protein 6.5 - 8.1 g/dL -  Total Bilirubin 0.3 - 1.2 mg/dL -  Alkaline Phos 38 - 126 U/L -  AST 15 - 41 U/L -  ALT 17 - 63 U/L -   CBC Latest Ref Rng & Units 12/08/2018  WBC 4.0 - 10.5 K/uL 8.4  Hemoglobin 13.0 - 17.0 g/dL 7.5(L)  Hematocrit 39.0 - 52.0 % 25.4(L)  Platelets 150 - 400 K/uL 247   RADIOGRAPHIC STUDIES: I have personally reviewed the radiological images as listed and agreed with the findings in the report. Dg Chest 1 View  Result Date: 11/16/2018 CLINICAL DATA:  History of pneumonia. EXAM: CHEST  1 VIEW COMPARISON:  11/14/2018 FINDINGS: The heart is enlarged but stable. No infiltrates, edema or effusions. The bony thorax is intact. IMPRESSION: No  acute cardiopulmonary findings.  Stable cardiac enlargement. Electronically Signed   By: Marijo Sanes M.D.   On: 11/16/2018 13:11   Dg Chest 2 View  Result Date: 12/05/2018 CLINICAL DATA:  Chest pain began this morning EXAM: CHEST - 2 VIEW COMPARISON:  Chest x-ray of 11/16/2017 FINDINGS: No active infiltrate or effusion is seen. Mediastinal and hilar contours appear stable. Cardiomegaly is stable. No acute bony abnormality is seen. Coronary artery stent is noted. IMPRESSION: Stable cardiomegaly.  No active lung disease. Electronically Signed   By: Ivar Drape M.D.   On: 12/05/2018 10:47   Dg Chest Portable 1 View  Result Date: 11/14/2018 CLINICAL DATA:  Central chest and left arm pain. EXAM: PORTABLE CHEST 1 VIEW COMPARISON:  09/04/2018 FINDINGS: The heart size and mediastinal contours are within normal limits. Both lungs are clear. The visualized skeletal structures are unremarkable. IMPRESSION: No active disease. Electronically Signed   By: Ashley Royalty M.D.   On: 11/14/2018 22:23    Assessment and plan- Patient is a 77 y.o. male with multiple comorbidities including hypertension, diabetes, CKD, previous NSTEMI present for evaluation of chest pain.  #Acute on chronic anemia, labs reviewed and discussed with patient wife. Multifactorial, secondary to CKD plus underlying iron deficiency. Ferritin decreased at a level of 11.  Decreased saturation ratio. Suspect patient have chronic GI blood loss, which may even get exacerbated with antiplatelet and heparin anticoagulation. Recommend IV Venofer.  Plan IV iron with Venofer 200mg  daily x 2. Allergy reactions/infusion reaction including anaphylactic reaction discussed with patient. Other side effects include but not limited to high blood pressure, skin rash,  weight gain, leg swelling, etc. Patient voices understanding and willing to proceed. 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  I will give patient premed of Benadryl 25 mg x 1  prior to first Venofer infusion. Patient can follow-up outpatient with me for additional IV iron I would not consider starting erythropoietin therapy given potential side effects of thrombosis risk.  In addition her iron stores need to be further optimized first.  Need GI work up. Discussed with patient that he needs to follow-up with his [facility] gastroenterologist for work-up of iron deficiency anemia.  Needs colonoscopy. If acute bleeding, need inpatient GI evaluation.   Thank you for allowing me to participate in the care of this patient.  Total face to face encounter time for this patient visit was 70 min. >50% of the time was  spent in counseling and coordination of care.    Earlie Server, MD, PhD Hematology Oncology [facility] at [facility] Pager- [phone] 12/08/2018

## 2018-12-08 NOTE — Progress Notes (Signed)
[facility]  ROUNDING NOTE   Subjective:   Catheterization scheduled for today. Denies any more chest pain or shortness of breath.   UOP 1937mL  Creatinine 2.28  NS at 133mL/hr   Objective:  Vital signs in last 24 hours:  Temp:  [97.6 F (36.4 C)-98.7 F (37.1 C)] 98.4 F (36.9 C) (12/30 0721) Pulse Rate:  [56-61] 61 (12/30 0721) Resp:  [19-20] 19 (12/30 0721) BP: (108-130)/(42-50) 114/42 (12/30 0721) SpO2:  [98 %-100 %] 100 % (12/30 0721) Weight:  [87.1 kg] 87.1 kg (12/30 0529)  Weight change: 1.361 kg Filed Weights   12/06/18 0436 12/07/18 0415 12/08/18 0529  Weight: 86.3 kg 85.7 kg 87.1 kg    Intake/Output: I/O last 3 completed shifts: In: 1732.6 [I.V.:1732.6] Out: 2375 [Urine:2375]   Intake/Output this shift:  No intake/output data recorded.  Physical Exam: General: No acute distress  Head: Normocephalic, atraumatic. Moist oral mucosal membranes  Eyes: Anicteric  Neck: Supple, trachea midline  Lungs:  Clear to auscultation, normal effort  Heart: S1S2 no rubs  Abdomen:  Soft, nontender, bowel sounds present  Extremities: No peripheral edema.  Neurologic: Awake, alert, following commands  Skin: No lesions        Basic Metabolic Panel: Recent Labs  Lab 12/05/18 1036 12/06/18 0336  NA 138 135  K 4.3 4.5  CL 106 103  CO2 22 22  GLUCOSE 78 173*  BUN 47* 49*  CREATININE 2.11* 2.28*  CALCIUM 9.7 9.0    Liver Function Tests: No results for input(s): AST, ALT, ALKPHOS, BILITOT, PROT, ALBUMIN in the last 168 hours. No results for input(s): LIPASE, AMYLASE in the last 168 hours. No results for input(s): AMMONIA in the last 168 hours.  CBC: Recent Labs  Lab 12/05/18 1036 12/06/18 0336 12/07/18 0447 12/08/18 0328  WBC 7.3 9.8 7.6 8.4  HGB 9.4* 8.5* 8.7* 7.5*  HCT 30.9* 28.3* 28.8* 25.4*  MCV 81.5 82.5 81.8 82.5  PLT 317 313 285 247    Cardiac Enzymes: Recent Labs  Lab 12/05/18 1036 12/05/18 1551 12/05/18 2058  12/06/18 0336  TROPONINI <0.03 <0.03 <0.03 <0.03    BNP: Invalid input(s): POCBNP  CBG: Recent Labs  Lab 12/07/18 0726 12/07/18 1132 12/07/18 1618 12/07/18 2113 12/08/18 0722  GLUCAP 96 175* 214* 229* 129*    Microbiology: Results for orders placed or performed during the hospital encounter of 05/06/17  Blood Culture (routine x 2)     Status: None   Collection Time: 05/06/17  7:02 PM  Result Value Ref Range Status   Specimen Description BLOOD RIGHT HAND  Final   Special Requests   Final    BOTTLES DRAWN AEROBIC AND ANAEROBIC Blood Culture adequate volume   Culture NO GROWTH 5 DAYS  Final   Report Status 05/11/2017 FINAL  Final  Blood Culture (routine x 2)     Status: None   Collection Time: 05/06/17  7:02 PM  Result Value Ref Range Status   Specimen Description BLOOD LEFT AC  Final   Special Requests   Final    BOTTLES DRAWN AEROBIC AND ANAEROBIC Blood Culture adequate volume   Culture NO GROWTH 5 DAYS  Final   Report Status 05/11/2017 FINAL  Final  Urine culture     Status: None   Collection Time: 05/06/17  7:18 PM  Result Value Ref Range Status   Specimen Description URINE, RANDOM  Final   Special Requests NONE  Final   Culture   Final    NO GROWTH  Performed at [facility], [place] [address], [place], [place] [address]    Report Status 05/08/2017 FINAL  Final  Gastrointestinal Panel by PCR , Stool     Status: Abnormal   Collection Time: 05/07/17  4:44 AM  Result Value Ref Range Status   Campylobacter species NOT DETECTED NOT DETECTED Final   Plesimonas shigelloides NOT DETECTED NOT DETECTED Final   Salmonella species DETECTED (A) NOT DETECTED Final    Comment: RESULT CALLED TO, READ BACK BY AND VERIFIED WITH: BETH BUONO 05/07/17 0642 ALV    Yersinia enterocolitica NOT DETECTED NOT DETECTED Final   Vibrio species NOT DETECTED NOT DETECTED Final   Vibrio cholerae NOT DETECTED NOT DETECTED Final   Enteroaggregative E coli (EAEC) NOT DETECTED NOT DETECTED  Final   Enteropathogenic E coli (EPEC) NOT DETECTED NOT DETECTED Final   Enterotoxigenic E coli (ETEC) NOT DETECTED NOT DETECTED Final   Shiga like toxin producing E coli (STEC) NOT DETECTED NOT DETECTED Final   E. coli O157 NOT DETECTED NOT DETECTED Final   Shigella/Enteroinvasive E coli (EIEC) NOT DETECTED NOT DETECTED Final   Cryptosporidium NOT DETECTED NOT DETECTED Final   Cyclospora cayetanensis NOT DETECTED NOT DETECTED Final   Entamoeba histolytica NOT DETECTED NOT DETECTED Final   Giardia lamblia NOT DETECTED NOT DETECTED Final   Adenovirus F40/41 NOT DETECTED NOT DETECTED Final   Astrovirus NOT DETECTED NOT DETECTED Final   Norovirus GI/GII NOT DETECTED NOT DETECTED Final   Rotavirus A NOT DETECTED NOT DETECTED Final   Sapovirus (I, II, IV, and V) NOT DETECTED NOT DETECTED Final  C difficile quick scan w PCR reflex     Status: None   Collection Time: 05/07/17  4:44 AM  Result Value Ref Range Status   C Diff antigen NEGATIVE NEGATIVE Final   C Diff toxin NEGATIVE NEGATIVE Final   C Diff interpretation No C. difficile detected.  Final  MRSA PCR Screening     Status: None   Collection Time: 05/07/17  6:24 AM  Result Value Ref Range Status   MRSA by PCR NEGATIVE NEGATIVE Final    Comment:        The GeneXpert MRSA Assay (FDA approved for NASAL specimens only), is one component of a comprehensive MRSA colonization surveillance program. It is not intended to diagnose MRSA infection nor to guide or monitor treatment for MRSA infections.     Coagulation Studies: Recent Labs    12/05/18 1036  LABPROT 12.9  INR 0.98    Urinalysis: No results for input(s): COLORURINE, LABSPEC, PHURINE, GLUCOSEU, HGBUR, BILIRUBINUR, KETONESUR, PROTEINUR, UROBILINOGEN, NITRITE, LEUKOCYTESUR in the last 72 hours.  Invalid input(s): APPERANCEUR    Imaging: No results found.   Medications:   . sodium chloride 125 mL/hr at 12/08/18 0334  . sodium chloride Stopped (12/07/18 2139)   . heparin 1,100 Units/hr (12/08/18 0526)   . allopurinol  200 mg Oral BID  . amLODipine  5 mg Oral BID  . aspirin  81 mg Oral Daily  . citalopram  10 mg Oral Daily  . clopidogrel  75 mg Oral Daily  . docusate sodium  100 mg Oral BID  . fluticasone  2 spray Each Nare Daily  . insulin aspart  0-9 Units Subcutaneous TID WC  . insulin glargine  62 Units Subcutaneous QHS  . isosorbide mononitrate  60 mg Oral BID  . methocarbamol  500 mg Oral BID  . metoprolol tartrate  75 mg Oral BID  . mometasone-formoterol  2  puff Inhalation BID  . ranolazine  500 mg Oral BID  . rosuvastatin  40 mg Oral QPM   acetaminophen **OR** acetaminophen, bisacodyl, chlorpheniramine-HYDROcodone, nitroGLYCERIN, ondansetron **OR** ondansetron (ZOFRAN) IV, traZODone  Assessment/ Plan:  [name] is a 77 y.o. white male with prostate cancer, diabetes mellitus type 2, diverticulosis, gout, coronary artery disease, hyperlipidemia, hypertension, peripheral vascular disease, skin cancer, obstructive sleep apnea was admitted with unstable angina.    1. Chronic kidney disease stage IV GFR appears to be at its baseline.   Patient is in need of catherization.   - IV fluids: NS at 161mL/hr pre and post procedure.   2.  Hypertension.   - amlodipine, metoprolol.  3.  Anemia chronic kidney disease.  Hemoglobin 7.5 Consider Epogen as an outpatient.  4.  Gout.   Continue allopurinol 200 mg p.o. twice daily    LOS: 3 Mike Decker 12/30/20198:49 AM

## 2018-12-08 NOTE — Progress Notes (Signed)
ANTICOAGULATION CONSULT NOTE - Initial Consult  Pharmacy Consult for heparin Indication: chest pain/ACS  Allergies  Allergen Reactions  . Gabapentin     Other reaction(s): Other (See Comments) Tremors  . Peanut-Containing Drug Products Anaphylaxis  . Penicillins Hives and Rash    Has patient had a PCN reaction causing immediate rash, facial/tongue/throat swelling, SOB or lightheadedness with hypotension: Yes Has patient had a PCN reaction causing severe rash involving mucus membranes or skin necrosis: No Has patient had a PCN reaction that required hospitalization No Has patient had a PCN reaction occurring within the last 10 years: No If all of the above answers are "NO", then may proceed with Cephalosporin use.  . Bee Venom Swelling  . Influenza Vaccines Hives  . Inh [Isoniazid] Hives  . Kenalog [Triamcinolone Acetonide] Hives  . Levaquin [Levofloxacin] Other (See Comments)    Tendon, ligament pain.   . Nalfon [Fenoprofen Calcium] Hives  . Naproxen   . Peanut Oil   . Nsaids Rash    Nalfon 600 Nalfon 600    Patient Measurements: Height: 5\' 6"  (167.6 cm) Weight: 189 lb (85.7 kg) IBW/kg (Calculated) : 63.8 Heparin Dosing Weight: 81.7 kg  Vital Signs: Temp: 98.7 F (37.1 C) (12/29 1925) Temp Source: Oral (12/29 1925) BP: 130/46 (12/29 1925) Pulse Rate: 60 (12/29 1925)  Labs: Recent Labs    12/05/18 1036 12/05/18 1551  12/05/18 2058 12/06/18 0336 12/07/18 0447 12/08/18 0328  HGB 9.4*  --   --   --  8.5* 8.7* 7.5*  HCT 30.9*  --   --   --  28.3* 28.8* 25.4*  PLT 317  --   --   --  313 285 247  APTT 32  --   --   --   --   --   --   LABPROT 12.9  --   --   --   --   --   --   INR 0.98  --   --   --   --   --   --   HEPARINUNFRC  --   --    < > 0.46 0.42 0.39 0.27*  CREATININE 2.11*  --   --   --  2.28*  --   --   TROPONINI <0.03 <0.03  --  <0.03 <0.03  --   --    < > = values in this interval not displayed.    Estimated Creatinine Clearance: 27.4 mL/min (A)  (by C-G formula based on SCr of 2.28 mg/dL (H)).   Medical History: Past Medical History:  Diagnosis Date  . Arthritis   . Atrioventricular canal (AVC)    irregular heart beats  . Barrett esophagus   . Cancer [facility]) 2002   prostate  . Colon polyp   . Diabetes mellitus without complication ([place])   . Diverticulosis   . Gout   . Heart disease   . Hemangioma    liver  . Hyperlipidemia   . Hypertension   . Myocardial infarct ([place])   . Ocular hypertension   . Peripheral vascular disease ([place])   . Skin cancer   . Sleep apnea   . Vitreoretinal degeneration     Medications: as ordered  Assessment: 77 yom cc chest pain with PMH HTN, HLD, CAD on DAPT, PVD. PTA is not completed at this time. CBC, BMP, PT/INR are ordered, resulted and appropriate. Pharmacy has been consulted to dose heparin for ACS.   12/27 21:00 HL 0.46  12/28 03:36 HL 0.42, 2nd therapeutic level 12/29 04:47 HL 0.39  Goal of Therapy:  Heparin level 0.3-0.7 units/ml Monitor platelets by anticoagulation protocol: Yes   Plan:  12/30 @ 0330 HL 0.27 subtherapeutic. Will increase rate to 1100 units/hr and will recheck HL @ 1300, Hgb down to 7.5 from 8.7, will continue to monitor.  Tobie Lords, PharmD, BCPS Clinical Pharmacist 12/08/2018

## 2018-12-08 NOTE — Progress Notes (Addendum)
ANTICOAGULATION CONSULT NOTE - Initial Consult  Pharmacy Consult for heparin Indication: chest pain/ACS  Allergies  Allergen Reactions  . Gabapentin     Other reaction(s): Other (See Comments) Tremors  . Peanut-Containing Drug Products Anaphylaxis  . Penicillins Hives and Rash    Has patient had a PCN reaction causing immediate rash, facial/tongue/throat swelling, SOB or lightheadedness with hypotension: Yes Has patient had a PCN reaction causing severe rash involving mucus membranes or skin necrosis: No Has patient had a PCN reaction that required hospitalization No Has patient had a PCN reaction occurring within the last 10 years: No If all of the above answers are "NO", then may proceed with Cephalosporin use.  . Bee Venom Swelling  . Influenza Vaccines Hives  . Inh [Isoniazid] Hives  . Kenalog [Triamcinolone Acetonide] Hives  . Levaquin [Levofloxacin] Other (See Comments)    Tendon, ligament pain.   . Nalfon [Fenoprofen Calcium] Hives  . Naproxen   . Peanut Oil   . Nsaids Rash    Nalfon 600 Nalfon 600    Patient Measurements: Height: 5\' 6"  (167.6 cm) Weight: 192 lb (87.1 kg) IBW/kg (Calculated) : 63.8 Heparin Dosing Weight: 81.7 kg  Vital Signs: Temp: 98.4 F (36.9 C) (12/30 0721) Temp Source: Oral (12/30 0721) BP: 114/42 (12/30 0721) Pulse Rate: 61 (12/30 0721)  Labs: Recent Labs    12/05/18 1551  12/05/18 2058  12/06/18 0336 12/07/18 0447 12/08/18 0328 12/08/18 1300  HGB  --   --   --    < > 8.5* 8.7* 7.5*  --   HCT  --   --   --   --  28.3* 28.8* 25.4*  --   PLT  --   --   --   --  313 285 247  --   HEPARINUNFRC  --    < > 0.46  --  0.42 0.39 0.27* 0.55  CREATININE  --   --   --   --  2.28*  --   --   --   TROPONINI <0.03  --  <0.03  --  <0.03  --   --   --    < > = values in this interval not displayed.    Estimated Creatinine Clearance: 27.6 mL/min (A) (by C-G formula based on SCr of 2.28 mg/dL (H)).   Medical History: Past Medical History:   Diagnosis Date  . Arthritis   . Atrioventricular canal (AVC)    irregular heart beats  . Barrett esophagus   . Cancer [facility]) 2002   prostate  . Colon polyp   . Diabetes mellitus without complication ([place])   . Diverticulosis   . Gout   . Heart disease   . Hemangioma    liver  . Hyperlipidemia   . Hypertension   . Myocardial infarct ([place])   . Ocular hypertension   . Peripheral vascular disease ([place])   . Skin cancer   . Sleep apnea   . Vitreoretinal degeneration     Medications: as ordered  Assessment: 77 yom cc chest pain with PMH HTN, HLD, CAD on DAPT, PVD. PTA is not completed at this time. CBC, BMP, PT/INR are ordered, resulted and appropriate. Pharmacy has been consulted to dose heparin for ACS.   12/27 21:00 HL 0.46   12/28 03:36 HL 0.42, 2nd therapeutic level 12/29 04:47 HL 0.39 12/30 0330  HL 0.27 12/30 1300  HL 0.55   Goal of Therapy:  Heparin level 0.3-0.7 units/ml Monitor platelets by anticoagulation  protocol: Yes   Plan:  12/30 @ 1300 0.55 Will continue rate of 1100 units/hr and will recheck HL in 8 hours at 2100, Hgb down to 7.5 from 8.7, will continue to monitor. No S/S of bleeding per nurse during progression rounds. Pharmacy will continue to monitor,   Paticia Stack, PharmD Pharmacy Resident  12/08/2018 1:36 PM

## 2018-12-08 NOTE — Progress Notes (Signed)
[place] at [place] NAME: Denman Pichardo    MR#:  [id number]  DATE OF BIRTH:  [birth year]  SUBJECTIVE:  Patient has no complaints.  Hemoglobin decreased to 7.5.  But no active bleeding. REVIEW OF SYSTEMS:  CONSTITUTIONAL: No fever, fatigue or weakness.  EYES: No blurred or double vision.  EARS, NOSE, AND THROAT: No tinnitus or ear pain.  RESPIRATORY: No cough, shortness of breath, wheezing or hemoptysis.  CARDIOVASCULAR: No chest pain, orthopnea, edema.  GASTROINTESTINAL: No nausea, vomiting, diarrhea or abdominal pain.  GENITOURINARY: No dysuria, hematuria.  ENDOCRINE: No polyuria, nocturia,  HEMATOLOGY: No anemia, easy bruising or bleeding SKIN: No rash or lesion. MUSCULOSKELETAL: No joint pain or arthritis.   NEUROLOGIC: No tingling, numbness, weakness.  PSYCHIATRY: No anxiety or depression.   ROS  DRUG ALLERGIES:   Allergies  Allergen Reactions  . Gabapentin     Other reaction(s): Other (See Comments) Tremors  . Peanut-Containing Drug Products Anaphylaxis  . Penicillins Hives and Rash    Has patient had a PCN reaction causing immediate rash, facial/tongue/throat swelling, SOB or lightheadedness with hypotension: Yes Has patient had a PCN reaction causing severe rash involving mucus membranes or skin necrosis: No Has patient had a PCN reaction that required hospitalization No Has patient had a PCN reaction occurring within the last 10 years: No If all of the above answers are "NO", then may proceed with Cephalosporin use.  . Bee Venom Swelling  . Influenza Vaccines Hives  . Inh [Isoniazid] Hives  . Kenalog [Triamcinolone Acetonide] Hives  . Levaquin [Levofloxacin] Other (See Comments)    Tendon, ligament pain.   . Nalfon [Fenoprofen Calcium] Hives  . Naproxen   . Peanut Oil   . Nsaids Rash    Nalfon 600 Nalfon 600    VITALS:  Blood pressure (!) 135/55, pulse 61, temperature 98.6 F (37 C), temperature source Oral,  resp. rate 18, height 5\' 6"  (1.676 m), weight 87.1 kg, SpO2 91 %.  PHYSICAL EXAMINATION:  GENERAL:  77 y.o.-year-old patient lying in the bed with no acute distress.  Obesity. EYES: Pupils equal, round, reactive to light and accommodation. No scleral icterus. Extraocular muscles intact.  HEENT: Head atraumatic, normocephalic. Oropharynx and nasopharynx clear.  NECK:  Supple, no jugular venous distention. No thyroid enlargement, no tenderness.  LUNGS: Normal breath sounds bilaterally, no wheezing, rales,rhonchi or crepitation. No use of accessory muscles of respiration.  CARDIOVASCULAR: S1, S2 normal. No murmurs, rubs, or gallops.  ABDOMEN: Soft, nontender, nondistended. Bowel sounds present. No organomegaly or mass.  EXTREMITIES: No pedal edema, cyanosis, or clubbing.  NEUROLOGIC: Cranial nerves II through XII are intact. Muscle strength 5/5 in all extremities. Sensation intact. Gait not checked.  PSYCHIATRIC: The patient is alert and oriented x 3.  SKIN: No obvious rash, lesion, or ulcer.   Physical Exam LABORATORY PANEL:   CBC Recent Labs  Lab 12/08/18 0328  WBC 8.4  HGB 7.5*  HCT 25.4*  PLT 247   ------------------------------------------------------------------------------------------------------------------  Chemistries  Recent Labs  Lab 12/06/18 0336  NA 135  K 4.5  CL 103  CO2 22  GLUCOSE 173*  BUN 49*  CREATININE 2.28*  CALCIUM 9.0   ------------------------------------------------------------------------------------------------------------------  Cardiac Enzymes Recent Labs  Lab 12/05/18 2058 12/06/18 0336  TROPONINI <0.03 <0.03   ------------------------------------------------------------------------------------------------------------------  RADIOLOGY:  No results found.  ASSESSMENT AND PLAN:  *Acute unstable angina  Resolved on current regiment  Noted abnormal stress test on Thursday Continue ACS protocol, aspirin, Plavix,  heparin drip,  nitrates as needed, Ranexa, beta-blocker therapy, statin therapy, IV morphine for breakthrough pain, supplemental oxygen. Chronic catheterization today.  *CKD stage III BL Cr around 2.1 Nephrology consulted given concern for possible contrast-induced nephropathy after heart catheterization  *Chronic diabetes mellitus type 2  Continue sliding scale insulin with Accu-Cheks per routine, cardiac/carbohydrate consistent diet  *Anemia of chronic disease.  Iron deficiency.   Per Dr. Tasia Catchings, IV rion x 2.  Follow-up her and [facility] physician as outpatient.  *Chronic obstructive sleep apnea CPAP at at bedtime/as needed  Hypertension.  Continue Norvasc and Lopressor. Obesity.  Diet control and follow-up with PCP.  All the records are reviewed and case discussed with Care Management/Social Workerr. Management plans discussed with the patient, his wife and they are in agreement.  CODE STATUS: full  TOTAL TIME TAKING CARE OF THIS PATIENT: 27 minutes.    POSSIBLE D/C IN 1-2 DAYS, DEPENDING ON CLINICAL CONDITION.   Demetrios Loll M.D on 12/08/2018   Between 7am to 6pm - Pager - [phone]  After 6pm go to [email or web address] - password [email or web address] [place] [facility]  Office  [phone]  CC: Primary care physician; Cletis Athens, MD  Note: This dictation was prepared with Dragon dictation along with smaller phrase technology. Any transcriptional errors that result from this process are unintentional.

## 2018-12-08 NOTE — Plan of Care (Signed)
Nutrition Education Note  RD consulted for nutrition education regarding a Heart Healthy diet.    77 y.o. white male with prostate cancer, diabetes mellitus type 2, diverticulosis, gout, coronary artery disease with 75% RCA lesion, hyperlipidemia, hypertension, peripheral vascular disease, skin cancer, obstructive sleep apnea was admitted with unstable angina.    Met with pt in room today. Provided heart healthy, renal and diabetes education today. Educated pt on eating a balanced diet that includes fruits, vegetables and whole grains while at the same time limiting foods high in potassium and phosphorus. Explained to pt that goal is not to avoid certain foods but rather to limit excess. Recommended avoiding "junk foods" that don't provided any nutritional value. Discouraged processed/packaged foods. Discouraged intake of salt substitutes and juices.    Lipid Panel     Component Value Date/Time   CHOL 112 11/15/2018 0406   TRIG 171 (H) 11/15/2018 0406   HDL 27 (L) 11/15/2018 0406   CHOLHDL 4.1 11/15/2018 0406   VLDL 34 11/15/2018 0406   LDLCALC 51 11/15/2018 0406    RD provided "Heart Healthy Nutrition Therapy" handout from the Academy of Nutrition and Dietetics. Reviewed patient's dietary recall. Provided examples on ways to decrease sodium and fat intake in diet. Discouraged intake of processed foods and use of salt shaker. Teach back method used.  Expect fair compliance.  Body mass index is 30.99 kg/m. Pt meets criteria for obesity based on current BMI.  Current diet order is HH, patient is consuming approximately 100% of meals at this time. Labs and medications reviewed. No further nutrition interventions warranted at this time. RD contact information provided. If additional nutrition issues arise, please re-consult RD.  Koleen Distance MS, RD, LDN Pager #- [phone] Office#- [phone] After Hours Pager: [phone]

## 2018-12-09 ENCOUNTER — Ambulatory Visit: Payer: Medicare Other | Admitting: Family

## 2018-12-09 ENCOUNTER — Encounter: Payer: Self-pay | Admitting: *Deleted

## 2018-12-09 LAB — CBC
HCT: 25.2 % — ABNORMAL LOW (ref 39.0–52.0)
Hemoglobin: 7.6 g/dL — ABNORMAL LOW (ref 13.0–17.0)
MCH: 24.8 pg — ABNORMAL LOW (ref 26.0–34.0)
MCHC: 30.2 g/dL (ref 30.0–36.0)
MCV: 82.1 fL (ref 80.0–100.0)
Platelets: 237 10*3/uL (ref 150–400)
RBC: 3.07 MIL/uL — ABNORMAL LOW (ref 4.22–5.81)
RDW: 15.6 % — AB (ref 11.5–15.5)
WBC: 8.4 10*3/uL (ref 4.0–10.5)
nRBC: 0 % (ref 0.0–0.2)

## 2018-12-09 LAB — BASIC METABOLIC PANEL
Anion gap: 6 (ref 5–15)
BUN: 39 mg/dL — ABNORMAL HIGH (ref 8–23)
CO2: 20 mmol/L — AB (ref 22–32)
Calcium: 8.2 mg/dL — ABNORMAL LOW (ref 8.9–10.3)
Chloride: 110 mmol/L (ref 98–111)
Creatinine, Ser: 2.17 mg/dL — ABNORMAL HIGH (ref 0.61–1.24)
GFR calc Af Amer: 33 mL/min — ABNORMAL LOW (ref >60)
GFR calc non Af Amer: 28 mL/min — ABNORMAL LOW (ref >60)
Glucose, Bld: 151 mg/dL — ABNORMAL HIGH (ref 70–99)
Potassium: 4.6 mmol/L (ref 3.5–5.1)
Sodium: 136 mmol/L (ref 135–145)

## 2018-12-09 LAB — GLUCOSE, CAPILLARY
Glucose-Capillary: 148 mg/dL — ABNORMAL HIGH (ref 70–99)
Glucose-Capillary: 130 mg/dL — ABNORMAL HIGH (ref 70–99)
Glucose-Capillary: 125 mg/dL — ABNORMAL HIGH (ref 70–99)
Glucose-Capillary: 141 mg/dL — ABNORMAL HIGH (ref 70–99)
Glucose-Capillary: 167 mg/dL — ABNORMAL HIGH (ref 70–99)

## 2018-12-09 LAB — PREPARE RBC (CROSSMATCH)

## 2018-12-09 MED ORDER — PANTOPRAZOLE SODIUM 40 MG PO TBEC
40.0000 mg | DELAYED_RELEASE_TABLET | Freq: Two times a day (BID) | ORAL | Status: DC
  Administered 2018-12-09 – 2018-12-13 (×8): 40 mg via ORAL
  Filled 2018-12-09 (×8): qty 1

## 2018-12-09 MED ORDER — SODIUM CHLORIDE 0.9% IV SOLUTION
Freq: Once | INTRAVENOUS | Status: AC
  Administered 2018-12-09: 23:00:00 via INTRAVENOUS

## 2018-12-09 MED ORDER — DIPHENHYDRAMINE HCL 25 MG PO CAPS
25.0000 mg | ORAL_CAPSULE | Freq: Once | ORAL | Status: AC
  Administered 2018-12-09: 25 mg via ORAL
  Filled 2018-12-09: qty 1

## 2018-12-09 MED ORDER — MAGNESIUM GLUCONATE 500 MG PO TABS
500.0000 mg | ORAL_TABLET | Freq: Two times a day (BID) | ORAL | Status: DC
  Administered 2018-12-09 – 2018-12-15 (×11): 500 mg via ORAL
  Filled 2018-12-09 (×13): qty 1

## 2018-12-09 MED ORDER — PANTOPRAZOLE SODIUM 40 MG PO TBEC: 40.0000 mg | DELAYED_RELEASE_TABLET | Freq: Two times a day (BID) | ORAL | Status: DC

## 2018-12-09 MED ORDER — BISACODYL 5 MG PO TBEC
10.0000 mg | DELAYED_RELEASE_TABLET | Freq: Every day | ORAL | Status: DC | PRN
  Administered 2018-12-10 – 2018-12-12 (×2): 10 mg via ORAL
  Filled 2018-12-09 (×2): qty 2

## 2018-12-09 NOTE — Progress Notes (Addendum)
[place] at [place] NAME: Mike Decker    MR#:  [id number]  DATE OF BIRTH:  01-Jan-1941  SUBJECTIVE:  Patient complains of dizziness and generalized weakness on exertion. REVIEW OF SYSTEMS:  CONSTITUTIONAL: No fever, dizziness and generalized weakness.  EYES: No blurred or double vision.  EARS, NOSE, AND THROAT: No tinnitus or ear pain.  RESPIRATORY: No cough, shortness of breath, wheezing or hemoptysis.  CARDIOVASCULAR: No chest pain, orthopnea, edema.  GASTROINTESTINAL: No nausea, vomiting, diarrhea or abdominal pain.  GENITOURINARY: No dysuria, hematuria.  ENDOCRINE: No polyuria, nocturia,  HEMATOLOGY: No anemia, easy bruising or bleeding SKIN: No rash or lesion. MUSCULOSKELETAL: No joint pain or arthritis.   NEUROLOGIC: No tingling, numbness, weakness.  PSYCHIATRY: No anxiety or depression.   ROS  DRUG ALLERGIES:   Allergies  Allergen Reactions  . Gabapentin     Other reaction(s): Other (See Comments) Tremors  . Peanut-Containing Drug Products Anaphylaxis  . Penicillins Hives and Rash    Has patient had a PCN reaction causing immediate rash, facial/tongue/throat swelling, SOB or lightheadedness with hypotension: Yes Has patient had a PCN reaction causing severe rash involving mucus membranes or skin necrosis: No Has patient had a PCN reaction that required hospitalization No Has patient had a PCN reaction occurring within the last 10 years: No If all of the above answers are "NO", then may proceed with Cephalosporin use.  . Bee Venom Swelling  . Influenza Vaccines Hives  . Inh [Isoniazid] Hives  . Kenalog [Triamcinolone Acetonide] Hives  . Levaquin [Levofloxacin] Other (See Comments)    Tendon, ligament pain.   . Nalfon [Fenoprofen Calcium] Hives  . Naproxen   . Peanut Oil   . Nsaids Rash    Nalfon 600 Nalfon 600    VITALS:  Blood pressure (!) 149/58, pulse 62, temperature 98.7 F (37.1 C), temperature source Oral,  resp. rate 18, height 5\' 6"  (1.676 m), weight 87.8 kg, SpO2 92 %.  PHYSICAL EXAMINATION:  GENERAL:  77 y.o.-year-old patient lying in the bed with no acute distress.  Obesity. EYES: Pupils equal, round, reactive to light and accommodation. No scleral icterus. Extraocular muscles intact.  HEENT: Head atraumatic, normocephalic. Oropharynx and nasopharynx clear.  NECK:  Supple, no jugular venous distention. No thyroid enlargement, no tenderness.  LUNGS: Normal breath sounds bilaterally, no wheezing, rales,rhonchi or crepitation. No use of accessory muscles of respiration.  CARDIOVASCULAR: S1, S2 normal. No murmurs, rubs, or gallops.  ABDOMEN: Soft, nontender, nondistended. Bowel sounds present. No organomegaly or mass.  EXTREMITIES: No pedal edema, cyanosis, or clubbing.  NEUROLOGIC: Cranial nerves II through XII are intact. Muscle strength 5/5 in all extremities. Sensation intact. Gait not checked.  PSYCHIATRIC: The patient is alert and oriented x 3.  SKIN: No obvious rash, lesion, or ulcer.   Physical Exam LABORATORY PANEL:   CBC Recent Labs  Lab 12/09/18 0316  WBC 8.4  HGB 7.6*  HCT 25.2*  PLT 237   ------------------------------------------------------------------------------------------------------------------  Chemistries  Recent Labs  Lab 12/09/18 0316  NA 136  K 4.6  CL 110  CO2 20*  GLUCOSE 151*  BUN 39*  CREATININE 2.17*  CALCIUM 8.2*   ------------------------------------------------------------------------------------------------------------------  Cardiac Enzymes Recent Labs  Lab 12/05/18 2058 12/06/18 0336  TROPONINI <0.03 <0.03   ------------------------------------------------------------------------------------------------------------------  RADIOLOGY:  No results found.  ASSESSMENT AND PLAN:  *Acute unstable angina  Resolved on current regiment  Noted abnormal stress test on Thursday Continue ACS protocol, aspirin, Plavix, heparin drip,  nitrates  as needed, Ranexa, beta-blocker therapy, statin therapy, IV morphine for breakthrough pain, supplemental oxygen. Chronic catheterization:  Successful cardiac cath with mild to moderate diffuse coronary disease throughout No significant high-grade obstructive coronary disease Patient appears to have possible microvascular disease LV gram was deferred because of renal insufficiency Continue aspirin and Plavix per Dr. Clayborn Bigness.  *CKD stage III BL Cr around 2.1, stable.  *Chronic diabetes mellitus type 2  Continue sliding scale insulin with Accu-Cheks per routine, cardiac/carbohydrate consistent diet  *Anemia of chronic disease.  Iron deficiency.   Per Dr. Tasia Catchings, IV rion x 2.  Follow-up her and [facility] physician as outpatient.  Hemoglobin is 7.6 today, since the patient has symptomatic anemia and acute unstable angina, will give PRBC transfusion. follow-up hemoglobin and stool occult.  No active bleeding.  *Chronic obstructive sleep apnea CPAP at at bedtime/as needed  Hypertension.  Continue Norvasc and Lopressor. Obesity.  Diet control and follow-up with PCP.  Generalized weakness.  The patient may need home health and PT.  Discussed with Dr. Clayborn Bigness and Dr. Juleen China. All the records are reviewed and case discussed with Care Management/Social Workerr. Management plans discussed with the patient, his wife and they are in agreement.  CODE STATUS: full  TOTAL TIME TAKING CARE OF THIS PATIENT: 26 minutes.    POSSIBLE D/C IN 1-2 DAYS, DEPENDING ON CLINICAL CONDITION.   Mike Decker M.D on 12/09/2018   Between 7am to 6pm - Pager - [phone]  After 6pm go to [email or web address] - password [email or web address] [place] [facility]  Office  [phone]  CC: Primary care physician; Cletis Athens, MD  Note: This dictation was prepared with Dragon dictation along with smaller phrase technology. Any transcriptional errors that result from this process are unintentional.

## 2018-12-09 NOTE — Progress Notes (Signed)
Spoke with patient's nurse about S/S of bleeding. Patient did cough up some blood this AM, and is currently receiving iron. Heparin has been off since 12/30 At 1602.No anticoagulation inpatient as patient is going to be discharged per hospitalist team. Plans to f/u outpatient with [facility].  Paticia Stack, PharmD Pharmacy Resident  12/09/2018 10:03 AM

## 2018-12-09 NOTE — Progress Notes (Signed)
Patient has rested quietly this shift. No complaints of pain, however patient has complained of lightheadedness/dizziness and weakness with minimal ambulation in the room and states he feels worse than when he first came in; therefore discharge was postponed.

## 2018-12-09 NOTE — Plan of Care (Signed)
  Problem: Cardiac: Goal: Ability to achieve and maintain adequate cardiovascular perfusion will improve Outcome: Progressing Note:  No complaints of chest pain this shift. Cath site on right groin level 0, dressing clean, dry & intact. On 2L O2, sats dropped to 88% on room air.   Problem: Education: Goal: Knowledge of General Education information will improve Description Including pain rating scale, medication(s)/side effects and non-pharmacologic comfort measures Outcome: Completed/Met

## 2018-12-09 NOTE — Progress Notes (Signed)
[facility]  ROUNDING NOTE   Subjective:   Cardiac catheterization yesterday. Recommended medical management.   Patient with drop in hemoglobin. Also with hemoptysis.   Started on IV iron.   Objective:  Vital signs in last 24 hours:  Temp:  [98 F (36.7 C)-98.7 F (37.1 C)] 98.7 F (37.1 C) (12/31 0733) Pulse Rate:  [57-62] 62 (12/31 0906) Resp:  [17-22] 18 (12/31 0733) BP: (112-149)/(43-78) 149/58 (12/31 0906) SpO2:  [2 %-96 %] 92 % (12/31 0733) Weight:  [87.1 kg-87.8 kg] 87.8 kg (12/31 0418)  Weight change: 0 kg Filed Weights   12/08/18 0529 12/08/18 1605 12/09/18 0418  Weight: 87.1 kg 87.1 kg 87.8 kg    Intake/Output: I/O last 3 completed shifts: In: 3450.6 [I.V.:3283.6; IV Piggyback:167] Out: 2200 [Urine:2200]   Intake/Output this shift:  Total I/O In: 480 [P.O.:480] Out: 350 [Urine:350]  Physical Exam: General: No acute distress  Head: Normocephalic, atraumatic. Moist oral mucosal membranes  Eyes: Anicteric  Neck: Supple, trachea midline  Lungs:  Clear to auscultation, normal effort  Heart: S1S2 no rubs  Abdomen:  Soft, nontender, bowel sounds present  Extremities: No peripheral edema.  Neurologic: Awake, alert, following commands  Skin: No lesions        Basic Metabolic Panel: Recent Labs  Lab 12/05/18 1036 12/06/18 0336 12/09/18 0316  NA 138 135 136  K 4.3 4.5 4.6  CL 106 103 110  CO2 22 22 20*  GLUCOSE 78 173* 151*  BUN 47* 49* 39*  CREATININE 2.11* 2.28* 2.17*  CALCIUM 9.7 9.0 8.2*    Liver Function Tests: No results for input(s): AST, ALT, ALKPHOS, BILITOT, PROT, ALBUMIN in the last 168 hours. No results for input(s): LIPASE, AMYLASE in the last 168 hours. No results for input(s): AMMONIA in the last 168 hours.  CBC: Recent Labs  Lab 12/05/18 1036 12/06/18 0336 12/07/18 0447 12/08/18 0328 12/08/18 2115 12/09/18 0316  WBC 7.3 9.8 7.6 8.4  --  8.4  HGB 9.4* 8.5* 8.7* 7.5* 8.4* 7.6*  HCT 30.9* 28.3* 28.8* 25.4*   --  25.2*  MCV 81.5 82.5 81.8 82.5  --  82.1  PLT 317 313 285 247  --  237    Cardiac Enzymes: Recent Labs  Lab 12/05/18 1036 12/05/18 1551 12/05/18 2058 12/06/18 0336  TROPONINI <0.03 <0.03 <0.03 <0.03    BNP: Invalid input(s): POCBNP  CBG: Recent Labs  Lab 12/08/18 1717 12/08/18 1818 12/08/18 2131 12/09/18 0425 12/09/18 0729  GLUCAP 161* 152* 188* 148* 130*    Microbiology: Results for orders placed or performed during the hospital encounter of 05/06/17  Blood Culture (routine x 2)     Status: None   Collection Time: 05/06/17  7:02 PM  Result Value Ref Range Status   Specimen Description BLOOD RIGHT HAND  Final   Special Requests   Final    BOTTLES DRAWN AEROBIC AND ANAEROBIC Blood Culture adequate volume   Culture NO GROWTH 5 DAYS  Final   Report Status 05/11/2017 FINAL  Final  Blood Culture (routine x 2)     Status: None   Collection Time: 05/06/17  7:02 PM  Result Value Ref Range Status   Specimen Description BLOOD LEFT AC  Final   Special Requests   Final    BOTTLES DRAWN AEROBIC AND ANAEROBIC Blood Culture adequate volume   Culture NO GROWTH 5 DAYS  Final   Report Status 05/11/2017 FINAL  Final  Urine culture     Status: None   Collection  Time: 05/06/17  7:18 PM  Result Value Ref Range Status   Specimen Description URINE, RANDOM  Final   Special Requests NONE  Final   Culture   Final    NO GROWTH Performed at [facility], [address] [place], [place] [address]    Report Status 05/08/2017 FINAL  Final  Gastrointestinal Panel by PCR , Stool     Status: Abnormal   Collection Time: 05/07/17  4:44 AM  Result Value Ref Range Status   Campylobacter species NOT DETECTED NOT DETECTED Final   Plesimonas shigelloides NOT DETECTED NOT DETECTED Final   Salmonella species DETECTED (A) NOT DETECTED Final    Comment: RESULT CALLED TO, READ BACK BY AND VERIFIED WITH: BETH BUONO 05/07/17 0642 ALV    Yersinia enterocolitica NOT DETECTED NOT DETECTED  Final   Vibrio species NOT DETECTED NOT DETECTED Final   Vibrio cholerae NOT DETECTED NOT DETECTED Final   Enteroaggregative E coli (EAEC) NOT DETECTED NOT DETECTED Final   Enteropathogenic E coli (EPEC) NOT DETECTED NOT DETECTED Final   Enterotoxigenic E coli (ETEC) NOT DETECTED NOT DETECTED Final   Shiga like toxin producing E coli (STEC) NOT DETECTED NOT DETECTED Final   E. coli O157 NOT DETECTED NOT DETECTED Final   Shigella/Enteroinvasive E coli (EIEC) NOT DETECTED NOT DETECTED Final   Cryptosporidium NOT DETECTED NOT DETECTED Final   Cyclospora cayetanensis NOT DETECTED NOT DETECTED Final   Entamoeba histolytica NOT DETECTED NOT DETECTED Final   Giardia lamblia NOT DETECTED NOT DETECTED Final   Adenovirus F40/41 NOT DETECTED NOT DETECTED Final   Astrovirus NOT DETECTED NOT DETECTED Final   Norovirus GI/GII NOT DETECTED NOT DETECTED Final   Rotavirus A NOT DETECTED NOT DETECTED Final   Sapovirus (I, II, IV, and V) NOT DETECTED NOT DETECTED Final  C difficile quick scan w PCR reflex     Status: None   Collection Time: 05/07/17  4:44 AM  Result Value Ref Range Status   C Diff antigen NEGATIVE NEGATIVE Final   C Diff toxin NEGATIVE NEGATIVE Final   C Diff interpretation No C. difficile detected.  Final  MRSA PCR Screening     Status: None   Collection Time: 05/07/17  6:24 AM  Result Value Ref Range Status   MRSA by PCR NEGATIVE NEGATIVE Final    Comment:        The GeneXpert MRSA Assay (FDA approved for NASAL specimens only), is one component of a comprehensive MRSA colonization surveillance program. It is not intended to diagnose MRSA infection nor to guide or monitor treatment for MRSA infections.     Coagulation Studies: No results for input(s): LABPROT, INR in the last 72 hours.  Urinalysis: No results for input(s): COLORURINE, LABSPEC, PHURINE, GLUCOSEU, HGBUR, BILIRUBINUR, KETONESUR, PROTEINUR, UROBILINOGEN, NITRITE, LEUKOCYTESUR in the last 72 hours.  Invalid  input(s): APPERANCEUR    Imaging: No results found.   Medications:   . iron sucrose 200 mg (12/09/18 0914)   . allopurinol  200 mg Oral BID  . amLODipine  5 mg Oral BID  . aspirin  81 mg Oral Daily  . citalopram  10 mg Oral Daily  . clopidogrel  75 mg Oral Daily  . docusate sodium  100 mg Oral BID  . fluticasone  2 spray Each Nare Daily  . insulin aspart  0-9 Units Subcutaneous TID WC  . insulin glargine  62 Units Subcutaneous QHS  . isosorbide mononitrate  60 mg Oral BID  . methocarbamol  500 mg Oral BID  . metoprolol tartrate  75 mg Oral BID  . mometasone-formoterol  2 puff Inhalation BID  . ranolazine  500 mg Oral BID  . rosuvastatin  40 mg Oral QPM   acetaminophen **OR** acetaminophen, bisacodyl, chlorpheniramine-HYDROcodone, nitroGLYCERIN, ondansetron **OR** ondansetron (ZOFRAN) IV, traZODone  Assessment/ Plan:  [name] is a 77 y.o. white male with prostate cancer, diabetes mellitus type 2, diverticulosis, gout, coronary artery disease, hyperlipidemia, hypertension, peripheral vascular disease, skin cancer, obstructive sleep apnea was admitted with unstable angina.    1. 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  GFR appears to be at its baseline.   - resume losartan and furosemide.   2.  Hypertension.  Blood pressure at goal. Resume losartan and furosemide.  - amlodipine, metoprolol. Monitor potassium   3.  Anemia chronic kidney disease.  Hemoglobin 7.5 - IV iron.  - Follow up with hematology - Consider Epogen as an outpatient.  4.  Gout.   Continue allopurinol 200 mg p.o. twice daily   5. Coronary artery disease - medical management    LOS: 4 Sheyanne Munley 12/31/201910:28 AM

## 2018-12-09 NOTE — Progress Notes (Signed)
Patient to discharge today. ANCA antibody lab ordered by Dr. Juleen China today was added on to morning labs per Mahala Menghini, [facility] lab. Lab has to be sent out for result, per Dr. Juleen China patient can discharge and they will follow-up with him.

## 2018-12-10 ENCOUNTER — Inpatient Hospital Stay: Payer: Medicare Other

## 2018-12-10 ENCOUNTER — Encounter: Payer: Self-pay | Admitting: Internal Medicine

## 2018-12-10 DIAGNOSIS — R0602 Shortness of breath: Secondary | ICD-10-CM

## 2018-12-10 DIAGNOSIS — R0601 Orthopnea: Secondary | ICD-10-CM

## 2018-12-10 LAB — GLUCOSE, CAPILLARY
GLUCOSE-CAPILLARY: 78 mg/dL (ref 70–99)
Glucose-Capillary: 110 mg/dL — ABNORMAL HIGH (ref 70–99)
Glucose-Capillary: 105 mg/dL — ABNORMAL HIGH (ref 70–99)
Glucose-Capillary: 119 mg/dL — ABNORMAL HIGH (ref 70–99)
Glucose-Capillary: 153 mg/dL — ABNORMAL HIGH (ref 70–99)

## 2018-12-10 LAB — BRAIN NATRIURETIC PEPTIDE: B Natriuretic Peptide: 546 pg/mL — ABNORMAL HIGH (ref 0.0–100.0)

## 2018-12-10 LAB — HEMOGLOBIN: Hemoglobin: 8.7 g/dL — ABNORMAL LOW (ref 13.0–17.0)

## 2018-12-10 LAB — FIBRIN DERIVATIVES D-DIMER (ARMC ONLY): Fibrin derivatives D-dimer (ARMC): 899.13 ng/mL (FEU) — ABNORMAL HIGH (ref 0.00–499.00)

## 2018-12-10 MED ORDER — ALUM & MAG HYDROXIDE-SIMETH 200-200-20 MG/5ML PO SUSP
15.0000 mL | Freq: Four times a day (QID) | ORAL | Status: DC | PRN
  Administered 2018-12-10 – 2018-12-11 (×3): 15 mL via ORAL
  Filled 2018-12-10 (×4): qty 30

## 2018-12-10 MED ORDER — FUROSEMIDE 10 MG/ML IJ SOLN
20.0000 mg | Freq: Two times a day (BID) | INTRAMUSCULAR | Status: DC
  Administered 2018-12-10 – 2018-12-11 (×2): 20 mg via INTRAVENOUS
  Filled 2018-12-10 (×2): qty 2

## 2018-12-10 MED ORDER — SODIUM CHLORIDE 0.9% FLUSH
10.0000 mL | Freq: Two times a day (BID) | INTRAVENOUS | Status: DC
  Administered 2018-12-10 – 2018-12-15 (×9): 10 mL via INTRAVENOUS

## 2018-12-10 MED ORDER — FLEET ENEMA 7-19 GM/118ML RE ENEM
1.0000 | ENEMA | Freq: Every day | RECTAL | Status: DC | PRN
  Administered 2018-12-10: 1 via RECTAL
  Filled 2018-12-10: qty 1

## 2018-12-10 NOTE — Progress Notes (Signed)
Dr. Bridgett Larsson notified of elevated d-dimer.

## 2018-12-10 NOTE — Progress Notes (Addendum)
Hematology/Oncology Progress Note [facility] [phone] [phone]  Patient Care Team: Cletis Athens, MD as PCP - General (Cardiology) Cletis Athens, MD as Referring Physician (Cardiology) Christene Lye, MD (General Surgery)   Name of the patient: Mike Decker  [id number]  Dec 07, 1941  Date of visit: 12/10/18   INTERVAL HISTORY-  Patient received IV Venofer 200 mg x 3 without any issue. Also received 1 unit of PRBC blood transfusion due to symptomatic anemia. Wife and other family members at bedside. Reports feeling shortness of breath. D-dimer was checked and was elevated.  Patient has VQ scan ordered rule out PE. Denies any chest pain.    Review of systems- Review of Systems  Constitutional: Positive for fatigue. Negative for appetite change, chills, fever and unexpected weight change.  HENT:   Negative for hearing loss and voice change.   Eyes: Negative for eye problems and icterus.  Respiratory: Positive for shortness of breath. Negative for chest tightness and cough.   Cardiovascular: Negative for chest pain and leg swelling.  Gastrointestinal: Negative for abdominal distention and abdominal pain.  Endocrine: Negative for hot flashes.  Genitourinary: Negative for difficulty urinating, dysuria and frequency.   Musculoskeletal: Negative for arthralgias.  Skin: Negative for itching and rash.  Neurological: Negative for light-headedness and numbness.  Hematological: Negative for adenopathy. Does not bruise/bleed easily.  Psychiatric/Behavioral: Negative for confusion.    Allergies  Allergen Reactions  . Gabapentin     Other reaction(s): Other (See Comments) Tremors  . Peanut-Containing Drug Products Anaphylaxis  . Penicillins Hives and Rash    Has patient had a PCN reaction causing immediate rash, facial/tongue/throat swelling, SOB or lightheadedness with hypotension: Yes Has patient had a PCN reaction causing  severe rash involving mucus membranes or skin necrosis: No Has patient had a PCN reaction that required hospitalization No Has patient had a PCN reaction occurring within the last 10 years: No If all of the above answers are "NO", then may proceed with Cephalosporin use.  . Bee Venom Swelling  . Influenza Vaccines Hives  . Inh [Isoniazid] Hives  . Kenalog [Triamcinolone Acetonide] Hives  . Levaquin [Levofloxacin] Other (See Comments)    Tendon, ligament pain.   . Nalfon [Fenoprofen Calcium] Hives  . Naproxen   . Peanut Oil   . Nsaids Rash    Nalfon 600 Nalfon 600    Patient Active Problem List   Diagnosis Date Noted  . Iron deficiency anemia due to chronic blood loss   . Unstable angina ([place])   . Anemia of chronic renal failure, stage 4 (severe) ([place])   . Chest pain 11/14/2018  . CAD (coronary artery disease) 02/27/2018  . Sacroiliac joint pain 10/08/2017  . Pain in limb 09/24/2017  . Neuropathy 07/24/2017  . NSTEMI (non-ST elevated myocardial infarction) ([place]) 05/06/2017  . PAD (peripheral artery disease) ([place]) 05/01/2017  . History of stroke 01/02/2017  . Barrett's esophagus 10/02/2016  . Carotid stenosis 10/02/2016  . Essential hypertension 10/02/2016  . Hyperlipidemia 10/02/2016  . Diabetes ([place]) 10/02/2016  . 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 Uncontrolled type 2 diabetes mellitus with hyperglycemia, with long-term current use of insulin ([place]) 04/10/2016  . TIA (transient ischemic attack) 10/08/2015     Past Medical History:  Diagnosis Date  . Arthritis   . Atrioventricular canal (AVC)    irregular heart beats  . Barrett esophagus   . Cancer [facility]) 2002   prostate  . Chronic diastolic CHF (congestive  heart failure) ([place])   . Colon polyp   . Diabetes mellitus without complication ([place])   . Diverticulosis   . Gout   . Heart disease   . Hemangioma    liver  . Hyperlipidemia   . Hypertension   . Myocardial infarct ([place])   .  Ocular hypertension   . Peripheral vascular disease ([place])   . Skin cancer   . Sleep apnea   . Vitreoretinal degeneration      Past Surgical History:  Procedure Laterality Date  . CATARACT EXTRACTION  2011, 2012  . CORONARY ANGIOPLASTY WITH STENT PLACEMENT  2012  . ESOPHAGOGASTRODUODENOSCOPY (EGD) WITH PROPOFOL N/A 06/01/2016   Procedure: ESOPHAGOGASTRODUODENOSCOPY (EGD) WITH PROPOFOL;  Surgeon: Manya Silvas, MD;  Location: [facility] ENDOSCOPY;  Service: Endoscopy;  Laterality: N/A;  . HERNIA REPAIR     x2  . INTRAOCULAR LENS INSERTION    . LEFT HEART CATH AND CORONARY ANGIOGRAPHY N/A 05/09/2017   Procedure: Left Heart Cath and Coronary Angiography;  Surgeon: Yolonda Kida, MD;  Location: [place] CV LAB;  Service: Cardiovascular;  Laterality: N/A;  . LEFT HEART CATH AND CORONARY ANGIOGRAPHY N/A 12/08/2018   Procedure: LEFT HEART CATH AND CORONARY ANGIOGRAPHY and possible PCI and stent;  Surgeon: Yolonda Kida, MD;  Location: [place] [facility] LAB;  Service: Cardiovascular;  Laterality: N/A;  . NOSE SURGERY     submucous resection  . PROSTATE SURGERY  2002  . ROTATOR CUFF REPAIR Right     Social History   Socioeconomic History  . Marital status: Married    Spouse name: Not on file  . Number of children: Not on file  . Years of education: Not on file  . Highest education level: Not on file  Occupational History  . Not on file  Social Needs  . Financial resource strain: Not on file  . Food insecurity:    Worry: Not on file    Inability: Not on file  . Transportation needs:    Medical: Not on file    Non-medical: Not on file  Tobacco Use  . Smoking status: Former Smoker    Years: 20.00    Types: Cigarettes    Last attempt to quit: 12/10/1976    Years since quitting: 42.0  . Smokeless tobacco: Never Used  Substance and Sexual Activity  . Alcohol use: No  . Drug use: No  . Sexual activity: Not on file  Lifestyle  . Physical activity:    Days per week: Not  on file    Minutes per session: Not on file  . Stress: Not on file  Relationships  . Social connections:    Talks on phone: Not on file    Gets together: Not on file    Attends religious service: Not on file    Active member of club or organization: Not on file    Attends meetings of clubs or organizations: Not on file    Relationship status: Not on file  . Intimate partner violence:    Fear of current or ex partner: Not on file    Emotionally abused: Not on file    Physically abused: Not on file    Forced sexual activity: Not on file  Other Topics Concern  . Not on file  Social History Narrative  . Not on file     Family History  Problem Relation Age of Onset  . Stroke Maternal Grandfather      Current Facility-Administered Medications:  .  acetaminophen (TYLENOL) tablet 650 mg, 650 mg, Oral, Q6H PRN, 650 mg at 12/06/18 0811 **OR** acetaminophen (TYLENOL) suppository 650 mg, 650 mg, Rectal, Q6H PRN, Epifanio Lesches, MD .  allopurinol (ZYLOPRIM) tablet 200 mg, 200 mg, Oral, BID, Epifanio Lesches, MD, 200 mg at 12/10/18 1021 .  alum & mag hydroxide-simeth (MAALOX/MYLANTA) 200-200-20 MG/5ML suspension 15 mL, 15 mL, Oral, Q6H PRN, Demetrios Loll, MD, 15 mL at 12/10/18 1201 .  amLODipine (NORVASC) tablet 5 mg, 5 mg, Oral, BID, Epifanio Lesches, MD, 5 mg at 12/10/18 1023 .  aspirin chewable tablet 81 mg, 81 mg, Oral, Daily, Epifanio Lesches, MD, 81 mg at 12/10/18 1023 .  bisacodyl (DULCOLAX) EC tablet 10 mg, 10 mg, Oral, Daily PRN, Demetrios Loll, MD, 10 mg at 12/10/18 1022 .  chlorpheniramine-HYDROcodone (TUSSIONEX) 10-8 MG/5ML suspension 5 mL, 5 mL, Oral, QHS PRN, Demetrios Loll, MD, 5 mL at 12/09/18 2221 .  citalopram (CELEXA) tablet 10 mg, 10 mg, Oral, Daily, Epifanio Lesches, MD, 10 mg at 12/10/18 1022 .  clopidogrel (PLAVIX) tablet 75 mg, 75 mg, Oral, Daily, Epifanio Lesches, MD, 75 mg at 12/10/18 1023 .  docusate sodium (COLACE) capsule 100 mg, 100 mg, Oral, BID,  Epifanio Lesches, MD, 100 mg at 12/10/18 1022 .  fluticasone (FLONASE) 50 MCG/ACT nasal spray 2 spray, 2 spray, Each Nare, Daily, Epifanio Lesches, MD, 2 spray at 12/10/18 1021 .  furosemide (LASIX) injection 20 mg, 20 mg, Intravenous, Q12H, Demetrios Loll, MD .  insulin aspart (novoLOG) injection 0-9 Units, 0-9 Units, Subcutaneous, TID WC, Epifanio Lesches, MD, 1 Units at 12/09/18 1719 .  insulin glargine (LANTUS) injection 62 Units, 62 Units, Subcutaneous, QHS, Epifanio Lesches, MD, 62 Units at 12/09/18 2224 .  iron sucrose (VENOFER) 200 mg in sodium chloride 0.9 % 150 mL IVPB, 200 mg, Intravenous, Daily, Earlie Server, MD, Last Rate: 106.7 mL/hr at 12/10/18 1030, 200 mg at 12/10/18 1030 .  isosorbide mononitrate (IMDUR) 24 hr tablet 60 mg, 60 mg, Oral, BID, Callwood, Dwayne D, MD, 60 mg at 12/10/18 0818 .  magnesium gluconate (MAGONATE) tablet 500 mg, 500 mg, Oral, BID, Demetrios Loll, MD, 500 mg at 12/10/18 1021 .  methocarbamol (ROBAXIN) tablet 500 mg, 500 mg, Oral, BID, Epifanio Lesches, MD, 500 mg at 12/10/18 1021 .  metoprolol tartrate (LOPRESSOR) tablet 75 mg, 75 mg, Oral, BID, Epifanio Lesches, MD, 75 mg at 12/10/18 1022 .  mometasone-formoterol (DULERA) 200-5 MCG/ACT inhaler 2 puff, 2 puff, Inhalation, BID, Epifanio Lesches, MD, 2 puff at 12/10/18 1021 .  nitroGLYCERIN (NITROSTAT) SL tablet 0.4 mg, 0.4 mg, Sublingual, Q5 min PRN, Salary, Montell D, MD .  ondansetron (ZOFRAN) tablet 4 mg, 4 mg, Oral, Q6H PRN **OR** ondansetron (ZOFRAN) injection 4 mg, 4 mg, Intravenous, Q6H PRN, Epifanio Lesches, MD .  pantoprazole (PROTONIX) EC tablet 40 mg, 40 mg, Oral, BID, Demetrios Loll, MD, 40 mg at 12/10/18 1022 .  ranolazine (RANEXA) 12 hr tablet 500 mg, 500 mg, Oral, BID, Callwood, Dwayne D, MD, 500 mg at 12/10/18 1021 .  rosuvastatin (CRESTOR) tablet 40 mg, 40 mg, Oral, QPM, Epifanio Lesches, MD, 40 mg at 12/09/18 1719 .  sodium phosphate (FLEET) 7-19 GM/118ML enema 1 enema, 1  enema, Rectal, Daily PRN, Demetrios Loll, MD, 1 enema at 12/10/18 1552 .  traZODone (DESYREL) tablet 50 mg, 50 mg, Oral, QHS PRN, Lance Coon, MD, 50 mg at 12/09/18 2252   Physical exam:  Vitals:   12/10/18 0437 12/10/18 0811 12/10/18 1021 12/10/18 1314  BP: (!) 134/58 139/70  Pulse: 64 (!) 58 62   Resp: 20     Temp: 97.7 F (36.5 C) 98.6 F (37 C)    TempSrc: Oral Oral    SpO2: 91% 93%  91%  Weight: 197 lb 14.4 oz (89.8 kg)     Height:       Physical Exam  Constitutional: He is oriented to person, place, and time. No distress.  HENT:  Head: Normocephalic and atraumatic.  Mouth/Throat: No oropharyngeal exudate.  Eyes: Pupils are equal, round, and reactive to light. EOM are normal. No scleral icterus.  Neck: Normal range of motion. Neck supple.  Cardiovascular: Normal rate and regular rhythm.  Murmur heard. Pulmonary/Chest: Effort normal. No respiratory distress.  Breathing via nasal cannula Oxygen.   Abdominal: Soft. He exhibits no distension. There is no abdominal tenderness.  Musculoskeletal: Normal range of motion.        General: No edema.  Neurological: He is alert and oriented to person, place, and time. No cranial nerve deficit.  Skin: Skin is warm and dry. He is not diaphoretic. No erythema. There is pallor.  Psychiatric: Affect normal.       CMP Latest Ref Rng & Units 12/09/2018  Glucose 70 - 99 mg/dL 151(H)  BUN 8 - 23 mg/dL 39(H)  Creatinine 0.61 - 1.24 mg/dL 2.17(H)  Sodium 135 - 145 mmol/L 136  Potassium 3.5 - 5.1 mmol/L 4.6  Chloride 98 - 111 mmol/L 110  CO2 22 - 32 mmol/L 20(L)  Calcium 8.9 - 10.3 mg/dL 8.2(L)  Total Protein 6.5 - 8.1 g/dL -  Total Bilirubin 0.3 - 1.2 mg/dL -  Alkaline Phos 38 - 126 U/L -  AST 15 - 41 U/L -  ALT 17 - 63 U/L -   CBC Latest Ref Rng & Units 12/10/2018  WBC 4.0 - 10.5 K/uL -  Hemoglobin 13.0 - 17.0 g/dL 8.7(L)  Hematocrit 39.0 - 52.0 % -  Platelets 150 - 400 K/uL -   RADIOGRAPHIC STUDIES: I have personally  reviewed the radiological images as listed and agreed with the findings in the report.  Dg Chest 1 View  Result Date: 11/16/2018 CLINICAL DATA:  History of pneumonia. EXAM: CHEST  1 VIEW COMPARISON:  11/14/2018 FINDINGS: The heart is enlarged but stable. No infiltrates, edema or effusions. The bony thorax is intact. IMPRESSION: No acute cardiopulmonary findings.  Stable cardiac enlargement. Electronically Signed   By: Marijo Sanes M.D.   On: 11/16/2018 13:11   Dg Chest 2 View  Result Date: 12/05/2018 CLINICAL DATA:  Chest pain began this morning EXAM: CHEST - 2 VIEW COMPARISON:  Chest x-ray of 11/16/2017 FINDINGS: No active infiltrate or effusion is seen. Mediastinal and hilar contours appear stable. Cardiomegaly is stable. No acute bony abnormality is seen. Coronary artery stent is noted. IMPRESSION: Stable cardiomegaly.  No active lung disease. Electronically Signed   By: Ivar Drape M.D.   On: 12/05/2018 10:47   Dg Chest Port 1 View  Result Date: 12/10/2018 CLINICAL DATA:  Patient with shortness of breath. EXAM: PORTABLE CHEST 1 VIEW COMPARISON:  Chest radiograph 12/05/2018 FINDINGS: Monitoring leads overlie the patient. Stable cardiomegaly. Interval development of bilateral interstitial pulmonary opacities. No large pleural effusion or pneumothorax. IMPRESSION: Cardiomegaly and interstitial opacities favored to represent edema. Electronically Signed   By: Lovey Newcomer M.D.   On: 12/10/2018 15:32   Dg Chest Portable 1 View  Result Date: 11/14/2018 CLINICAL DATA:  Central chest and left arm pain. EXAM: PORTABLE CHEST 1 VIEW COMPARISON:  09/04/2018 FINDINGS: The heart size and mediastinal contours are within normal limits. Both lungs are clear. The visualized skeletal structures are unremarkable. IMPRESSION: No active disease. Electronically Signed   By: Ashley Royalty M.D.   On: 11/14/2018 22:23    Assessment and plan-  Patient is a 78 y.o. male with multiple comorbidities including hypertension,  diabetes, CKD, previous NSTEMI present for evaluation of chest pain.  # Acute on chronic anemia/ iron deficiency S/p IV venofer 200mg  x 3 as well as 1 unit of PRBC transfusion.  Monitor cbc daily.  Suspect chronic GI blood loss. No acute bleeding inpatient. Outpatient follow up with GI for further management.  # Anemia due to chronic kidney disease,  Recent NSTEMI, cautious in erythropoietin therapy given thrombosis risk. Would not offer.  Increase ferritin level to be above 200 and rule out GI blood loss.   Outpatient follow up.   # SOB, etiology unknown. Pending VQ scan ordered rule out VTE.  Thank you for allowing me to participate in the care of this patient.  Total face to face encounter time for this patient visit was 25 min. >50% of the time was  spent in counseling and coordination of care.   Earlie Server, MD, PhD Hematology Oncology [facility] at [facility] Pager- [phone] 12/10/2018

## 2018-12-10 NOTE — Progress Notes (Addendum)
[place] [place] NAME: Mike Decker    MR#:  [id number]  DATE OF BIRTH:  05-16-1941  SUBJECTIVE:  Patient complains of shortness of breath, dizziness and generalized weakness on exertion.  He had hemoptysis yesterday and today.  He is put on oxygen by nasal cannula 4 L due to hypoxia.  He is on CPAP at night. REVIEW OF SYSTEMS:  CONSTITUTIONAL: No fever, dizziness and generalized weakness.  EYES: No blurred or double vision.  EARS, NOSE, AND THROAT: No tinnitus or ear pain.  RESPIRATORY:  has cough, hemoptysis and shortness of breath, wheezing or hemoptysis.  CARDIOVASCULAR: No chest pain, orthopnea, edema.  GASTROINTESTINAL: No nausea, vomiting, diarrhea or abdominal pain.  GENITOURINARY: No dysuria, hematuria.  ENDOCRINE: No polyuria, nocturia,  HEMATOLOGY: No anemia, easy bruising or bleeding SKIN: No rash or lesion. MUSCULOSKELETAL: No joint pain or arthritis.   NEUROLOGIC: No tingling, numbness, weakness.  PSYCHIATRY: No anxiety or depression.   ROS  DRUG ALLERGIES:   Allergies  Allergen Reactions  . Gabapentin     Other reaction(s): Other (See Comments) Tremors  . Peanut-Containing Drug Products Anaphylaxis  . Penicillins Hives and Rash    Has patient had a PCN reaction causing immediate rash, facial/tongue/throat swelling, SOB or lightheadedness with hypotension: Yes Has patient had a PCN reaction causing severe rash involving mucus membranes or skin necrosis: No Has patient had a PCN reaction that required hospitalization No Has patient had a PCN reaction occurring within the last 10 years: No If all of the above answers are "NO", then may proceed with Cephalosporin use.  . Bee Venom Swelling  . Influenza Vaccines Hives  . Inh [Isoniazid] Hives  . Kenalog [Triamcinolone Acetonide] Hives  . Levaquin [Levofloxacin] Other (See Comments)    Tendon, ligament pain.   . Nalfon [Fenoprofen Calcium] Hives  . Naproxen   .  Peanut Oil   . Nsaids Rash    Nalfon 600 Nalfon 600    VITALS:  Blood pressure 139/70, pulse 62, temperature 98.6 F (37 C), temperature source Oral, resp. rate 20, height 5\' 6"  (1.676 m), weight 89.8 kg, SpO2 91 %.  PHYSICAL EXAMINATION:  GENERAL:  78 y.o.-year-old patient lying in the bed with no acute distress.  Obesity. EYES: Pupils equal, round, reactive to light and accommodation. No scleral icterus. Extraocular muscles intact.  HEENT: Head atraumatic, normocephalic. Oropharynx and nasopharynx clear.  NECK:  Supple, no jugular venous distention. No thyroid enlargement, no tenderness.  LUNGS: Normal breath sounds bilaterally, no wheezing, rhonchi or crepitation.  Mild basilar crackles.  No use of accessory muscles of respiration.  CARDIOVASCULAR: S1, S2 normal. No murmurs, rubs, or gallops.  ABDOMEN: Soft, nontender, nondistended. Bowel sounds present. No organomegaly or mass.  EXTREMITIES: No pedal edema, cyanosis, or clubbing.  NEUROLOGIC: Cranial nerves II through XII are intact. Muscle strength 5/5 in all extremities. Sensation intact. Gait not checked.  PSYCHIATRIC: The patient is alert and oriented x 3.  SKIN: No obvious rash, lesion, or ulcer.   Physical Exam LABORATORY PANEL:   CBC Recent Labs  Lab 12/09/18 0316 12/10/18 0414  WBC 8.4  --   HGB 7.6* 8.7*  HCT 25.2*  --   PLT 237  --    ------------------------------------------------------------------------------------------------------------------  Chemistries  Recent Labs  Lab 12/09/18 0316  NA 136  K 4.6  CL 110  CO2 20*  GLUCOSE 151*  BUN 39*  CREATININE 2.17*  CALCIUM 8.2*   ------------------------------------------------------------------------------------------------------------------  Cardiac Enzymes  Recent Labs  Lab 12/05/18 2058 12/06/18 0336  TROPONINI <0.03 <0.03    ------------------------------------------------------------------------------------------------------------------  RADIOLOGY:  No results found.  ASSESSMENT AND PLAN:  *Acute unstable angina  Resolved on current regiment. Noted abnormal stress test on Thursday He was on heparin drip. Continue aspirin, Plavix, nitrates as needed, Ranexa, beta-blocker therapy, statin therapy, IV morphine for breakthrough pain, supplemental oxygen. Cardiac catheterization:  Successful cardiac cath with mild to moderate diffuse coronary disease throughout No significant high-grade obstructive coronary disease Patient appears to have possible microvascular disease LV gram was deferred because of renal insufficiency Continue aspirin and Plavix per Dr. Clayborn Bigness.  *CKD stage III BL Cr around 2.1, stable.  *Chronic diabetes mellitus type 2  Continue sliding scale insulin with Accu-Cheks per routine, cardiac/carbohydrate consistent diet  *Anemia of chronic disease.  Iron deficiency.   Per Dr. Tasia Catchings, IV rion x 2.  Follow-up her and [facility] physician as outpatient.  Hemoglobin down to 7.6, since the patient has symptomatic anemia and acute unstable angina, given 1 unit PRBC transfusion. Hb up to 8.7. follow-up hemoglobin and stool occult.  No active bleeding.  *Chronic obstructive sleep apnea CPAP at at bedtime/as needed  Hypertension.  Continue Norvasc and Lopressor, resume Lasix and losartan. Obesity.  Diet control and follow-up with PCP.  Generalized weakness.  The patient may need home health and PT.  Acute respiratory failure with hypoxia and hemoptysis.  D-dimer is elevated, need to rule out PE.  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 Chest x-ray and VQ scan due to CKD.  Possible acute on chronic diastolic CHF, recent echo showed ejection fraction 60%. Started Lasix.  Follow-up BMP.  Generalized weakness.  He has  recurrent admissions and need home health. Discussed with Dr. Juleen China. All the records are reviewed and case discussed with Care Management/Social Workerr. Management plans discussed with the patient, his wife and they are in agreement.  CODE STATUS: full  TOTAL TIME TAKING CARE OF THIS PATIENT: 32 minutes.  POSSIBLE D/C IN 2 DAYS, DEPENDING ON CLINICAL CONDITION.   Demetrios Loll M.D on 12/10/2018   Between 7am to 6pm - Pager - [phone]  After 6pm go to [email or web address] - password [email or web address] [facility]  Office  [phone]  CC: Primary care physician; Cletis Athens, MD  Note: This dictation was prepared with Dragon dictation along with smaller phrase technology. Any transcriptional errors that result from this process are unintentional.

## 2018-12-11 ENCOUNTER — Encounter: Payer: Self-pay | Admitting: Radiology

## 2018-12-11 ENCOUNTER — Inpatient Hospital Stay: Payer: Medicare Other

## 2018-12-11 DIAGNOSIS — R079 Chest pain, unspecified: Secondary | ICD-10-CM

## 2018-12-11 DIAGNOSIS — I214 Non-ST elevation (NSTEMI) myocardial infarction: Secondary | ICD-10-CM

## 2018-12-11 DIAGNOSIS — N184 Chronic kidney disease, stage 4 (severe): Secondary | ICD-10-CM

## 2018-12-11 DIAGNOSIS — R042 Hemoptysis: Secondary | ICD-10-CM

## 2018-12-11 LAB — GLUCOSE, CAPILLARY
Glucose-Capillary: 109 mg/dL — ABNORMAL HIGH (ref 70–99)
Glucose-Capillary: 161 mg/dL — ABNORMAL HIGH (ref 70–99)
Glucose-Capillary: 191 mg/dL — ABNORMAL HIGH (ref 70–99)
Glucose-Capillary: 50 mg/dL — ABNORMAL LOW (ref 70–99)
Glucose-Capillary: 51 mg/dL — ABNORMAL LOW (ref 70–99)
Glucose-Capillary: 76 mg/dL (ref 70–99)
Glucose-Capillary: 86 mg/dL (ref 70–99)

## 2018-12-11 LAB — URINALYSIS, ROUTINE W REFLEX MICROSCOPIC
Bilirubin Urine: NEGATIVE
Glucose, UA: NEGATIVE mg/dL
HGB URINE DIPSTICK: NEGATIVE
Ketones, ur: NEGATIVE mg/dL
Leukocytes, UA: NEGATIVE
Nitrite: NEGATIVE
Protein, ur: NEGATIVE mg/dL
Specific Gravity, Urine: 1.013 (ref 1.005–1.030)
pH: 5 (ref 5.0–8.0)

## 2018-12-11 LAB — BASIC METABOLIC PANEL
ANION GAP: 8 (ref 5–15)
BUN: 44 mg/dL — ABNORMAL HIGH (ref 8–23)
CO2: 19 mmol/L — ABNORMAL LOW (ref 22–32)
Calcium: 8.5 mg/dL — ABNORMAL LOW (ref 8.9–10.3)
Chloride: 108 mmol/L (ref 98–111)
Creatinine, Ser: 2.4 mg/dL — ABNORMAL HIGH (ref 0.61–1.24)
GFR calc non Af Amer: 25 mL/min — ABNORMAL LOW (ref >60)
GFR, EST AFRICAN AMERICAN: 29 mL/min — AB (ref >60)
Glucose, Bld: 64 mg/dL — ABNORMAL LOW (ref 70–99)
Potassium: 4.3 mmol/L (ref 3.5–5.1)
Sodium: 135 mmol/L (ref 135–145)

## 2018-12-11 LAB — SEDIMENTATION RATE: Sed Rate: 67 mm/hr — ABNORMAL HIGH (ref 0–20)

## 2018-12-11 LAB — MPO/PR-3 (ANCA) ANTIBODIES
ANCA Proteinase 3: <3.5 U/mL (ref 0.0–3.5)
Myeloperoxidase Abs: <9 U/mL (ref 0.0–9.0)

## 2018-12-11 LAB — MAGNESIUM: Magnesium: 2.4 mg/dL (ref 1.7–2.4)

## 2018-12-11 LAB — HEMOGLOBIN: Hemoglobin: 8.3 g/dL — ABNORMAL LOW (ref 13.0–17.0)

## 2018-12-11 MED ORDER — LOSARTAN POTASSIUM 25 MG PO TABS
25.0000 mg | ORAL_TABLET | Freq: Every day | ORAL | Status: DC
  Administered 2018-12-11: 25 mg via ORAL
  Filled 2018-12-11 (×2): qty 1

## 2018-12-11 MED ORDER — FUROSEMIDE 20 MG PO TABS
20.0000 mg | ORAL_TABLET | Freq: Every day | ORAL | Status: DC
  Administered 2018-12-11 – 2018-12-15 (×4): 20 mg via ORAL
  Filled 2018-12-11 (×4): qty 1

## 2018-12-11 MED ORDER — INSULIN GLARGINE 100 UNIT/ML ~~LOC~~ SOLN
55.0000 [IU] | Freq: Every day | SUBCUTANEOUS | Status: DC
  Administered 2018-12-11: 55 [IU] via SUBCUTANEOUS
  Filled 2018-12-11: qty 0.55

## 2018-12-11 MED ORDER — TECHNETIUM TC 99M DIETHYLENETRIAME-PENTAACETIC ACID
30.0000 | Freq: Once | INTRAVENOUS | Status: AC | PRN
  Administered 2018-12-11: 33.314 via RESPIRATORY_TRACT

## 2018-12-11 MED ORDER — INSULIN GLARGINE 100 UNIT/ML ~~LOC~~ SOLN
60.0000 [IU] | Freq: Every day | SUBCUTANEOUS | Status: DC
  Filled 2018-12-11: qty 0.6

## 2018-12-11 MED ORDER — TECHNETIUM TO 99M ALBUMIN AGGREGATED
4.0000 | Freq: Once | INTRAVENOUS | Status: AC | PRN
  Administered 2018-12-11: 4.259 via INTRAVENOUS

## 2018-12-11 NOTE — Consult Note (Addendum)
Pulmonary Critical Care  Initial Consult Note  ARLANDER GILLEN NLG:[id number] DOB: Jun 28, 1941 DOA: 12/05/2018  Referring physician: Dr. Bridgett Larsson  Chief Complaint: Patient is well known to me from the office he has multiple medical problems including history of chronic kidney disease stage III followed by nephrology type 2 diabetes essential hypertension hyperlipidemia coronary artery disease and is followed by Dr. Rockwood Sink.  Apparently patient was in the hospital with complaints of chest pain.  The pain was associated with shortness of breath and also dizziness.  Patient was admitted for evaluation and a cardiac catheterization.  He was initially started on heparin as well as nitro.  And the patient had negative troponins initially.  He was taken to the Cath Lab he was taken to the cardiac lab Cath Lab and found to have distal LAD 75% stenosed proximal to mid LAD 50% stenosis circumflex to mid circumflex 75% stenosis distal RCA lesion 40% stenosis first marginal to first marginal lesion is 50% stenosis proximal RCA to mid RCA lesion is 40% stenosis.  It was also felt that he might have microvascular disease.  Because of his renal failure patient was not felt to be a good candidate for a LV gram.  The patient tells me that his films are supposed to be sent to [facility] and the official recommendations from the Cath Lab note is aspirin and Plavix.  He is now been complaining about having hemoptysis.  The patient's chest x-ray is showing interstitial opacities favoring edema but certainly could also represent blood.  He did have a VQ scan which was interpreted as normal.   Review of Systems:  Constitutional:  No weight loss, night sweats, Fevers, chills,+fatigue.  HEENT:  No headaches, nasal congestion, post nasal drip,  Cardio-vascular:  No chest pain, Orthopnea, PND, swelling in lower extremities  GI:  No heartburn, indigestion, abdominal pain, nausea, vomiting, diarrhea  Resp:  +shortness of breath at  rest. no productive cough, +coughing up of blood.No wheezing Skin:  no rash or lesions.  Musculoskeletal:  No joint pain or swelling.   Remainder ROS performed and is unremarkable other than noted in HPI  Past Medical History:  Diagnosis Date  . Arthritis   . Atrioventricular canal (AVC)    irregular heart beats  . Barrett esophagus   . Cancer [facility]) 2002   prostate  . Chronic diastolic CHF (congestive heart failure) ([place])   . Colon polyp   . Diabetes mellitus without complication ([place])   . Diverticulosis   . Gout   . Heart disease   . Hemangioma    liver  . Hyperlipidemia   . Hypertension   . Myocardial infarct ([place])   . Ocular hypertension   . Peripheral vascular disease ([place])   . Skin cancer   . Sleep apnea   . Vitreoretinal degeneration    Past Surgical History:  Procedure Laterality Date  . CATARACT EXTRACTION  2011, 2012  . CORONARY ANGIOPLASTY WITH STENT PLACEMENT  2012  . ESOPHAGOGASTRODUODENOSCOPY (EGD) WITH PROPOFOL N/A 06/01/2016   Procedure: ESOPHAGOGASTRODUODENOSCOPY (EGD) WITH PROPOFOL;  Surgeon: Manya Silvas, MD;  Location: [facility] ENDOSCOPY;  Service: Endoscopy;  Laterality: N/A;  . HERNIA REPAIR     x2  . INTRAOCULAR LENS INSERTION    . LEFT HEART CATH AND CORONARY ANGIOGRAPHY N/A 05/09/2017   Procedure: Left Heart Cath and Coronary Angiography;  Surgeon: Yolonda Kida, MD;  Location: [facility] CV LAB;  Service: Cardiovascular;  Laterality: N/A;  . LEFT HEART CATH AND  CORONARY ANGIOGRAPHY N/A 12/08/2018   Procedure: LEFT HEART CATH AND CORONARY ANGIOGRAPHY and possible PCI and stent;  Surgeon: Yolonda Kida, MD;  Location: [place] CV LAB;  Service: Cardiovascular;  Laterality: N/A;  . NOSE SURGERY     submucous resection  . PROSTATE SURGERY  2002  . ROTATOR CUFF REPAIR Right    Social History:  reports that he quit smoking about 42 years ago. His smoking use included cigarettes. He quit after 20.00 years of use. He has never used  smokeless tobacco. He reports that he does not drink alcohol or use drugs.  Allergies  Allergen Reactions  . Gabapentin     Other reaction(s): Other (See Comments) Tremors  . Peanut-Containing Drug Products Anaphylaxis  . Penicillins Hives and Rash    Has patient had a PCN reaction causing immediate rash, facial/tongue/throat swelling, SOB or lightheadedness with hypotension: Yes Has patient had a PCN reaction causing severe rash involving mucus membranes or skin necrosis: No Has patient had a PCN reaction that required hospitalization No Has patient had a PCN reaction occurring within the last 10 years: No If all of the above answers are "NO", then may proceed with Cephalosporin use.  . Bee Venom Swelling  . Influenza Vaccines Hives  . Inh [Isoniazid] Hives  . Kenalog [Triamcinolone Acetonide] Hives  . Levaquin [Levofloxacin] Other (See Comments)    Tendon, ligament pain.   . Nalfon [Fenoprofen Calcium] Hives  . Naproxen   . Peanut Oil   . Nsaids Rash    Nalfon 600 Nalfon 600    Family History  Problem Relation Age of Onset  . Stroke Maternal Grandfather     Prior to Admission medications   Medication Sig Start Date End Date Taking? Authorizing Provider  acetaminophen (TYLENOL) 500 MG tablet Take 1,000 mg by mouth every 6 (six) hours as needed for mild pain, moderate pain or headache.   Yes [provider]  allopurinol (ZYLOPRIM) 100 MG tablet Take 200 mg by mouth 2 (two) times daily.    Yes [provider]  amLODipine (NORVASC) 5 MG tablet Take 5 mg by mouth 2 (two) times daily.  04/02/17  Yes [provider]  aspirin (ASPIRIN CHILDRENS) 81 MG chewable tablet Chew 1 tablet (81 mg total) by mouth daily. 10/09/15  Yes Mody, Ulice Bold, MD  budesonide-formoterol (SYMBICORT) 160-4.5 MCG/ACT inhaler Inhale 2 puffs into the lungs 2 (two) times daily.   Yes [provider]  cetirizine (ZYRTEC) 5 MG tablet Take 5 mg by mouth daily as needed for  allergies or rhinitis.    Yes [provider]  citalopram (CELEXA) 10 MG tablet Take 10 mg by mouth daily.   Yes [provider]  clopidogrel (PLAVIX) 75 MG tablet Take 1 tablet (75 mg total) by mouth daily. 10/09/15  Yes Mody, Ulice Bold, MD  colchicine 0.6 MG tablet Take 1 tablet (0.6 mg total) by mouth daily. Patient taking differently: Take 1.8 mg by mouth daily as needed (gout flares).  11/17/18  Yes Mody, Ulice Bold, MD  dextromethorphan-guaiFENesin (MUCINEX DM) 30-600 MG 12hr tablet Take 1 tablet by mouth 2 (two) times daily as needed for cough.   Yes [provider]  EPINEPHrine (EPI-PEN) 0.3 mg/0.3 mL SOAJ injection Inject into the muscle as needed (anaphylaxis).    Yes [provider]  fluticasone (FLONASE) 50 MCG/ACT nasal spray Place 2 sprays into both nostrils daily.  11/04/18  Yes [provider]  furosemide (LASIX) 20 MG tablet Take 2 tablets (40  mg total) by mouth daily. 11/17/18  Yes Mody, Ulice Bold, MD  insulin aspart (NOVOLOG FLEXPEN) 100 UNIT/ML FlexPen Inject 14-24 Units into the skin See admin instructions. Inject 14u under the skin at breakfast, 24u at lunch and 24u under the skin at dinnertime according to sliding scale   Yes [provider]  insulin glargine (LANTUS) 100 UNIT/ML injection Inject 62 Units into the skin at bedtime.    Yes [provider]  isosorbide mononitrate (IMDUR) 120 MG 24 hr tablet Take 1 tablet (120 mg total) by mouth daily. 10/09/15  Yes Mody, Ulice Bold, MD  losartan (COZAAR) 25 MG tablet Take 25 mg by mouth daily. 10/23/18  Yes [provider]  magnesium gluconate (MAGONATE) 500 MG tablet Take 500 mg by mouth 2 (two) times daily.    Yes [provider]  methocarbamol (ROBAXIN) 500 MG tablet Take 500 mg by mouth 2 (two) times daily.  11/08/18  Yes [provider]  metoprolol tartrate (LOPRESSOR) 50 MG tablet Take 75 mg by mouth 2 (two) times daily.    Yes [provider]   nitroGLYCERIN (NITROSTAT) 0.4 MG SL tablet Place 0.4 mg under the tongue every 5 (five) minutes x 3 doses as needed for chest pain.    Yes [provider]  Omega-3 Fatty Acids (FISH OIL) 1000 MG CAPS Take 1 capsule by mouth daily.    Yes [provider]  pantoprazole (PROTONIX) 40 MG tablet Take 40 mg by mouth 2 (two) times daily.   Yes [provider]  rosuvastatin (CRESTOR) 40 MG tablet Take 40 mg by mouth every evening.    Yes [provider]  traMADol (ULTRAM) 50 MG tablet Take 50 mg by mouth every 4 (four) hours as needed for moderate pain.    Yes [provider]  Vitamin D, Ergocalciferol, (DRISDOL) 50000 units CAPS capsule Take 50,000 Units by mouth every 30 (thirty) days.    Yes [provider]  ipratropium-albuterol (DUONEB) 0.5-2.5 (3) MG/3ML SOLN Take 3 mLs by nebulization every 4 (four) hours as needed. Patient not taking: Reported on 11/14/2018 05/15/17   Hillary Bow, MD  Metoprolol Tartrate 75 MG TABS Take 50 mg by mouth 2 (two) times daily. 11/17/18   Bettey Costa, MD   Physical Exam: Vitals:   12/11/18 0559 12/11/18 0755 12/11/18 0809 12/11/18 1652  BP: (!) 107/40 (!) 109/52  (!) 136/49  Pulse: (!) 57 61  60  Resp: (!) 22 18 20 18   Temp: 99.9 F (37.7 C) 98.3 F (36.8 C)  98.6 F (37 C)  TempSrc:    Oral  SpO2: 92% (!) 89% 96% 94%  Weight: 90.8 kg     Height:        Wt Readings from Last 3 Encounters:  12/11/18 90.8 kg  11/17/18 88.2 kg  10/20/18 92.3 kg    General:  Appears calm and comfortable Eyes: PERRL, normal lids, irises & conjunctiva ENT: grossly normal hearing, lips & tongue Neck: no LAD, masses or thyromegaly Cardiovascular: RRR, no m/r/g. No LE edema. Respiratory: CTA bilaterally, no w/r/r.       Normal respiratory effort. Abdomen: soft, nontender Skin: no rash or induration seen on limited exam Musculoskeletal: grossly normal tone BUE/BLE Psychiatric: grossly normal mood and affect Neurologic:  grossly non-focal.          Labs on Admission:  Basic Metabolic Panel: Recent Labs  Lab 12/05/18 1036 12/06/18 0336 12/09/18 0316 12/11/18 0435  NA 138 135 136 135  K 4.3  4.5 4.6 4.3  CL 106 103 110 108  CO2 22 22 20* 19*  GLUCOSE 78 173* 151* 64*  BUN 47* 49* 39* 44*  CREATININE 2.11* 2.28* 2.17* 2.40*  CALCIUM 9.7 9.0 8.2* 8.5*  MG  --   --   --  2.4   Liver Function Tests: No results for input(s): AST, ALT, ALKPHOS, BILITOT, PROT, ALBUMIN in the last 168 hours. No results for input(s): LIPASE, AMYLASE in the last 168 hours. No results for input(s): AMMONIA in the last 168 hours. CBC: Recent Labs  Lab 12/05/18 1036 12/06/18 0336 12/07/18 0447 12/08/18 0328 12/08/18 2115 12/09/18 0316 12/10/18 0414 12/11/18 0436  WBC 7.3 9.8 7.6 8.4  --  8.4  --   --   HGB 9.4* 8.5* 8.7* 7.5* 8.4* 7.6* 8.7* 8.3*  HCT 30.9* 28.3* 28.8* 25.4*  --  25.2*  --   --   MCV 81.5 82.5 81.8 82.5  --  82.1  --   --   PLT 317 313 285 247  --  237  --   --    Cardiac Enzymes: Recent Labs  Lab 12/05/18 1036 12/05/18 1551 12/05/18 2058 12/06/18 0336  TROPONINI <0.03 <0.03 <0.03 <0.03    BNP (last 3 results) Recent Labs    11/14/18 2139 12/10/18 1837  BNP 231.0* 546.0*    ProBNP (last 3 results) No results for input(s): PROBNP in the last 8760 hours.  CBG: Recent Labs  Lab 12/11/18 0602 12/11/18 0757 12/11/18 0823 12/11/18 1158 12/11/18 1654  GLUCAP 51* 50* 76 86 191*    Radiological Exams on Admission: Nm Pulmonary Perf And Vent  Result Date: 12/11/2018 CLINICAL DATA:  Acute onset of shortness of breath. Hemoptysis. Chronic renal failure precludes IV contrast administration. EXAM: NUCLEAR MEDICINE VENTILATION - PERFUSION LUNG SCAN TECHNIQUE: Ventilation images were obtained in multiple projections using inhaled aerosol Tc-37m DTPA. Perfusion images were obtained in multiple projections after intravenous injection of Tc-20m MAA. RADIOPHARMACEUTICALS:  33.3 mCi of Tc-65m  DTPA aerosol inhalation and 4.3 mCi Tc74m MAA IV COMPARISON:  No prior nuclear imaging. Chest x-rays 12/10/2018, 12/05/2018 and earlier. FINDINGS: Ventilation: No focal ventilatory defect. Ingested aerosol in the esophagus and stomach. Perfusion: Normal perfusion. No wedge-shaped perfusion defects to indicate pulmonary embolism. IMPRESSION: Normal examination. Electronically Signed   By: Evangeline Dakin M.D.   On: 12/11/2018 13:05   Dg Chest Port 1 View  Result Date: 12/10/2018 CLINICAL DATA:  Patient with shortness of breath. EXAM: PORTABLE CHEST 1 VIEW COMPARISON:  Chest radiograph 12/05/2018 FINDINGS: Monitoring leads overlie the patient. Stable cardiomegaly. Interval development of bilateral interstitial pulmonary opacities. No large pleural effusion or pneumothorax. IMPRESSION: Cardiomegaly and interstitial opacities favored to represent edema. Electronically Signed   By: Lovey Newcomer M.D.   On: 12/10/2018 15:32    EKG: Independently reviewed.  Assessment/Plan Active Problems:   NSTEMI (non-ST elevated myocardial infarction) (HCC)   Iron deficiency anemia due to chronic blood loss   Unstable angina (HCC)   Anemia of chronic renal failure, stage 4 (severe) (HCC)   SOB (shortness of breath)   1. Hemoptysis multifactorial probably related to anticoagulation which was started with aspirin and Plavix also possibility that is related to underlying pulmonary edema.  I have ordered a CT scan of the chest for further evaluation make certain there is no distinct pulmonary masses that we need to be aware of.  Will discuss further with cardiology.  With the VQ scan having been read as normal I do not  think this is pulmonary embolism and again we cannot give him contrasted CT study because of his underlying renal function. 2. Non-STEMI patient has multiple vessel lesions noted.  He was deemed as a medical management case however I think his films are supposed to be sent to [facility] for further evaluation.  I  think the findings on the chest x-ray most accurately would reflect pulmonary edema which could also has noted explain his hemoptysis. 3. Chronic renal failure stage IV obviously we cannot give him more contrast so he may not be the best candidate for angioplasty and again I think [facility] cardi[facility]  Code Status: Full code  Family Communication: Wife and children at the bedside Disposition Plan: Home  Time spent: 70 minutes  I have personally obtained a history, examined the patient, evaluated laboratory and imaging results, formulated the assessment and plan and placed orders.  The Patient requires high complexity decision making for assessment and support. Total Time Spent 36min   Nahomy Limburg A Salle Brandle, MD [facility] Pulmonary Critical Care Medicine Sleep Medicine

## 2018-12-11 NOTE — Progress Notes (Signed)
Cardiovascular and Pulmonary Nurse Navigator Note:    This patient was recently seen during his prior admission on 11/17/2018, during which he was diagnosed with NSTEMI and acute on chronic CHF.  HF education was completed at that time.   Patient admitted with acute unstable angina on 12/05/2018.  Patient was found to have low hgb of 7.6.  One unit of PRBC given Cardiac Catheterization was performed on 12/08/2018, which revealed mild to moderate diffuse coronary disease throughout.  There was no significant high-grade coronary disease. 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   Patient's BNP on admission was 546. CXR revealed:  _________________________________________________  EXAM: PORTABLE CHEST 1 VIEW  COMPARISON:  Chest radiograph 12/05/2018  FINDINGS: Monitoring leads overlie the patient. Stable cardiomegaly. Interval development of bilateral interstitial pulmonary opacities. No large pleural effusion or pneumothorax.  IMPRESSION: Cardiomegaly and interstitial opacities favored to represent edema. ____________________________________________________  Patient had VQ scan today due to positive D-dimer.  Results pending.  Patient being followed by Nephrology, Hematology Oncology, and Cardiology.  Pulmonary Consult for hemoptysis pending.    Today this RN rounded on patient to review HF education.  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  Patient lives at home with his wife.  Patient talking about what has happened to him since his last admission.  Provided HF re-education to patient.    CHF Education:?? Educational session with patient completed.  Provided patient with "Living Better with Heart Failure" packet although patient declined packet at first, as he has one at home from previous  admission.   Briefly reviewed definition of heart failure and signs and symptoms of an exacerbation.?Explained to patient that HF is a chronic illness which requires self-assessment / self-management along with help from the cardiologist/PCP.?? ? *Reviewed importance of and reason behind checking weight daily in the AM, after using the bathroom, but before getting dressed. Patient has scales and reports that he has been weighing himself most days.   ? *Reviewed with patient the following information: *Discussed when to call the Dr= weight gain of >2-3lb overnight of 5lb in a week,  *Discussed yellow zone= call MD: weight gain of >2-3lb overnight of 5lb in a week, increased swelling, increased SOB when lying down, chest discomfort, dizziness, increased fatigue *Red Zone= call 911: struggle to breath, fainting or near fainting, significant chest pain   *Diet - Reviewed low sodium diet-provided handout of recommended and not recommended foods.?Patient reported that he has been following a low sodium diet for the most part.   ? *Discussed fluid intake with patient as well. Patient not currently on a fluid restriction, but advised no more than 8-8 ounce glasses of fluids per day.?Patient did admit that he probably drinks 3 of the bedside water pitchers per day.  He recognizes this is an opportunity for improvement.   ? *Instructed patient to take medications as prescribed for heart failure. Explained briefly why pt is on the medications (either make you feel better, live longer or keep you out of the hospital) and discussed monitoring and side effects.  ? *Discussed exercise. Discussed Cardiac Rehab with patient during his last admission. Patient is currently having hemoptysis and is on 4 liters of oxygen via [place].   Pulmonary consult pending.  ?Patient may need outpatient PT prior to Cardiac Rehab.  Patient has declined Home  Health services.    *Smoking Cessation- Patient is a former smoker.? ? *[facility]  Heart Failure Clinic - Explained the purpose of the HF Clinic. Explained to patient the HF Clinic does not replace PCP nor Cardiologist, but is an additional resource to helping patient manage heart failure at home. Patient originally had an appointment in the [facility] earlier this week, but patient was hospitalized.  New appointment is scheduled for 12/22/2018 at 9:00 a.m.    Patient asked this RN how he should take his NTG given the fact the Cardiologist feels he has microvascular disease.  Patient is currently on Imdur 24 hour 60 mg daily and Ranexa 500 mg 2 times a day.  Patient informed this RN he took 3 nitroglycerin tabs under his tongue prior to coming to ER.  Reminded patient his medical condition is multifactorial - given his low hgb, CHF, and microvascular disease.  Reminded patient low hemoglogin can put a strain on his heart making his heart failure worse, making his heart work harder,  and causing chest pain from his heart muscle not getting enough oxygen.  Patient will discuss with Dr. Clayborn Bigness.  Patient has an appointment on December 16, 2018 with Dr. Clayborn Bigness.  Patient will also discuss Cardiac Rehab during this visit.    Again, the 5 Steps to Living Better with Heart Failure were reviewed with patient.   Patient thanked me for providing the above information. ? ? Roanna Epley, RN, BSN, [facility]? [facility]  Cardiovascular &?Pulmonary Nurse Navigator  Direct Line: [phone]  Department Phone #: [phone] Fax: [phone]? Email Address: [email or web address] Cardiovascular and Pulmonary Nurse Navigator Note:

## 2018-12-11 NOTE — Progress Notes (Addendum)
[place] [facility] [place] NAME: Mike Decker    MR#:  [id number]  DATE OF BIRTH:  1941-11-03  SUBJECTIVE:  Patient complains of hemoptysis, better shortness of breath. Still on oxygen by nasal cannula 4 L. He is on CPAP at night. REVIEW OF SYSTEMS:  CONSTITUTIONAL: No fever, no dizziness, has generalized weakness.  EYES: No blurred or double vision.  EARS, NOSE, AND THROAT: No tinnitus or ear pain.  RESPIRATORY:  has cough, hemoptysis and shortness of breath, wheezing or hemoptysis.  CARDIOVASCULAR: No chest pain, orthopnea, edema.  GASTROINTESTINAL: No nausea, vomiting, diarrhea or abdominal pain.  GENITOURINARY: No dysuria, hematuria.  ENDOCRINE: No polyuria, nocturia,  HEMATOLOGY: No anemia, easy bruising or bleeding SKIN: No rash or lesion. MUSCULOSKELETAL: No joint pain or arthritis.   NEUROLOGIC: No tingling, numbness, weakness.  PSYCHIATRY: No anxiety or depression.   ROS  DRUG ALLERGIES:   Allergies  Allergen Reactions  . Gabapentin     Other reaction(s): Other (See Comments) Tremors  . Peanut-Containing Drug Products Anaphylaxis  . Penicillins Hives and Rash    Has patient had a PCN reaction causing immediate rash, facial/tongue/throat swelling, SOB or lightheadedness with hypotension: Yes Has patient had a PCN reaction causing severe rash involving mucus membranes or skin necrosis: No Has patient had a PCN reaction that required hospitalization No Has patient had a PCN reaction occurring within the last 10 years: No If all of the above answers are "NO", then may proceed with Cephalosporin use.  . Bee Venom Swelling  . Influenza Vaccines Hives  . Inh [Isoniazid] Hives  . Kenalog [Triamcinolone Acetonide] Hives  . Levaquin [Levofloxacin] Other (See Comments)    Tendon, ligament pain.   . Nalfon [Fenoprofen Calcium] Hives  . Naproxen   . Peanut Oil   . Nsaids Rash    Nalfon 600 Nalfon 600    VITALS:  Blood pressure  (!) 109/52, pulse 61, temperature 98.3 F (36.8 C), resp. rate 20, height 5\' 6"  (1.676 m), weight 90.8 kg, SpO2 96 %.  PHYSICAL EXAMINATION:  GENERAL:  78 y.o.-year-old patient lying in the bed with no acute distress.  Obesity. EYES: Pupils equal, round, reactive to light and accommodation. No scleral icterus. Extraocular muscles intact.  HEENT: Head atraumatic, normocephalic. Oropharynx and nasopharynx clear.  NECK:  Supple, no jugular venous distention. No thyroid enlargement, no tenderness.  LUNGS: Normal breath sounds bilaterally, no wheezing, rhonchi or crepitation.  No basilar crackles.  No use of accessory muscles of respiration.  CARDIOVASCULAR: S1, S2 normal. No murmurs, rubs, or gallops.  ABDOMEN: Soft, nontender, nondistended. Bowel sounds present. No organomegaly or mass.  EXTREMITIES: No pedal edema, cyanosis, or clubbing.  NEUROLOGIC: Cranial nerves II through XII are intact. Muscle strength 5/5 in all extremities. Sensation intact. Gait not checked.  PSYCHIATRIC: The patient is alert and oriented x 3.  SKIN: No obvious rash, lesion, or ulcer.   Physical Exam LABORATORY PANEL:   CBC Recent Labs  Lab 12/09/18 0316  12/11/18 0436  WBC 8.4  --   --   HGB 7.6*   < > 8.3*  HCT 25.2*  --   --   PLT 237  --   --    < > = values in this interval not displayed.   ------------------------------------------------------------------------------------------------------------------  Chemistries  Recent Labs  Lab 12/11/18 0435  NA 135  K 4.3  CL 108  CO2 19*  GLUCOSE 64*  BUN 44*  CREATININE 2.40*  CALCIUM 8.5*  MG 2.4   ------------------------------------------------------------------------------------------------------------------  Cardiac Enzymes Recent Labs  Lab 12/05/18 2058 12/06/18 0336  TROPONINI <0.03 <0.03   ------------------------------------------------------------------------------------------------------------------  RADIOLOGY:  Nm Pulmonary  Perf And Vent  Result Date: 12/11/2018 CLINICAL DATA:  Acute onset of shortness of breath. Hemoptysis. Chronic renal failure precludes IV contrast administration. EXAM: NUCLEAR MEDICINE VENTILATION - PERFUSION LUNG SCAN TECHNIQUE: Ventilation images were obtained in multiple projections using inhaled aerosol Tc-46m DTPA. Perfusion images were obtained in multiple projections after intravenous injection of Tc-22m MAA. RADIOPHARMACEUTICALS:  33.3 mCi of Tc-67m DTPA aerosol inhalation and 4.3 mCi Tc17m MAA IV COMPARISON:  No prior nuclear imaging. Chest x-rays 12/10/2018, 12/05/2018 and earlier. FINDINGS: Ventilation: No focal ventilatory defect. Ingested aerosol in the esophagus and stomach. Perfusion: Normal perfusion. No wedge-shaped perfusion defects to indicate pulmonary embolism. IMPRESSION: Normal examination. Electronically Signed   By: Evangeline Dakin M.D.   On: 12/11/2018 13:05   Dg Chest Port 1 View  Result Date: 12/10/2018 CLINICAL DATA:  Patient with shortness of breath. EXAM: PORTABLE CHEST 1 VIEW COMPARISON:  Chest radiograph 12/05/2018 FINDINGS: Monitoring leads overlie the patient. Stable cardiomegaly. Interval development of bilateral interstitial pulmonary opacities. No large pleural effusion or pneumothorax. IMPRESSION: Cardiomegaly and interstitial opacities favored to represent edema. Electronically Signed   By: Lovey Newcomer M.D.   On: 12/10/2018 15:32    ASSESSMENT AND PLAN:  *Acute unstable angina  Resolved on current regiment. Noted abnormal stress test on Thursday He was on heparin drip. Continue aspirin, Plavix, nitrates as needed, Ranexa, beta-blocker therapy, statin therapy, IV morphine for breakthrough pain, supplemental oxygen. Cardiac catheterization:  Successful cardiac cath with mild to moderate diffuse coronary disease throughout No significant high-grade obstructive coronary disease Patient appears to have possible microvascular disease LV gram was deferred because of  renal insufficiency Continue aspirin and Plavix per Dr. Clayborn Bigness. Aspirin and Plavix is on hold due to hemoptysis.  *CKD stage III BL Cr around 2.1, follow-up BMP while on Lasix.  *Chronic diabetes mellitus type 2  Continue sliding scale insulin with Accu-Cheks per routine, levemir, cardiac/carbohydrate consistent diet  *Anemia of chronic disease.  Iron deficiency.   Per Dr. Tasia Catchings, IV rion x 2.  Follow-up her and [facility] physician as outpatient.  Hemoglobin down to 7.6, since the patient has symptomatic anemia and acute unstable angina, given 1 unit PRBC transfusion. Hb up to 8.7. follow-up hemoglobin and stool occult.  No active bleeding.  *Chronic obstructive sleep apnea CPAP at at bedtime/as needed  Hypertension.  Continue Norvasc and Lopressor, resume Lasix and losartan. Obesity.  Diet control and follow-up with PCP.  Generalized weakness.  The patient may need home health and PT.  Acute respiratory failure with hypoxia and hemoptysis.  D-dimer is elevated, need to rule out PE.  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 VQ scan: Normal examination.  Pulmonary consult from Dr. Humphrey Rolls. Aspirin and Plavix on hold due to hemoptysis.  Acute on chronic diastolic CHF, recent echo showed ejection fraction 60%. Started Lasix.  Follow-up BMP.  Generalized weakness.  He has recurrent admissions and need home health. Discussed with Dr. Juleen China. All the records are reviewed and case discussed with Care Management/Social Workerr. Management plans discussed with the patient, his wife and they are in agreement.  CODE STATUS: full  TOTAL TIME TAKING CARE OF THIS PATIENT: 33 minutes.  POSSIBLE D/C IN 2 DAYS, DEPENDING ON CLINICAL CONDITION.   Demetrios Loll M.D on 12/11/2018  Between 7am to 6pm - Pager - [phone]  After 6pm go to [email or web address] - password [email or web address] [facility]  Office   [phone]  CC: Primary care physician; Cletis Athens, MD  Note: This dictation was prepared with Dragon dictation along with smaller phrase technology. Any transcriptional errors that result from this process are unintentional.

## 2018-12-11 NOTE — Progress Notes (Signed)
Talked to Dr. Jerelyn Charles about patient's complaints of chest pressure 5/10 was radiating on his left chest. SL Nitro was given, BP was 113/40. Patient also received Ranexa and Imdur at 2123. Order for stat EKG and stat troponin. RN will continue to monitor.

## 2018-12-11 NOTE — Progress Notes (Signed)
[facility]  ROUNDING NOTE   Subjective:   Continues to complain of shortness of breath  Objective:  Vital signs in last 24 hours:  Temp:  [98 F (36.7 C)-99.9 F (37.7 C)] 98.3 F (36.8 C) (01/02 0755) Pulse Rate:  [56-64] 61 (01/02 0755) Resp:  [18-22] 20 (01/02 0809) BP: (107-139)/(40-52) 109/52 (01/02 0755) SpO2:  [89 %-96 %] 96 % (01/02 0809) Weight:  [90.8 kg] 90.8 kg (01/02 0559)  Weight change: 1.033 kg Filed Weights   12/09/18 0418 12/10/18 0437 12/11/18 0559  Weight: 87.8 kg 89.8 kg 90.8 kg    Intake/Output: I/O last 3 completed shifts: In: 967 [P.O.:480; Blood:333.9; IV Piggyback:153] Out: 524 [Urine:524]   Intake/Output this shift:  Total I/O In: -  Out: 825 [Urine:825]  Physical Exam: General: No acute distress  Head: Normocephalic, atraumatic. Moist oral mucosal membranes  Eyes: Anicteric  Neck: Supple, trachea midline  Lungs:  Clear to auscultation, normal effort  Heart: S1S2 no rubs  Abdomen:  Soft, nontender, bowel sounds present  Extremities: No peripheral edema.  Neurologic: Awake, alert, following commands  Skin: No lesions        Basic Metabolic Panel: Recent Labs  Lab 12/05/18 1036 12/06/18 0336 12/09/18 0316 12/11/18 0435  NA 138 135 136 135  K 4.3 4.5 4.6 4.3  CL 106 103 110 108  CO2 22 22 20* 19*  GLUCOSE 78 173* 151* 64*  BUN 47* 49* 39* 44*  CREATININE 2.11* 2.28* 2.17* 2.40*  CALCIUM 9.7 9.0 8.2* 8.5*  MG  --   --   --  2.4    Liver Function Tests: No results for input(s): AST, ALT, ALKPHOS, BILITOT, PROT, ALBUMIN in the last 168 hours. No results for input(s): LIPASE, AMYLASE in the last 168 hours. No results for input(s): AMMONIA in the last 168 hours.  CBC: Recent Labs  Lab 12/05/18 1036 12/06/18 0336 12/07/18 0447 12/08/18 0328 12/08/18 2115 12/09/18 0316 12/10/18 0414 12/11/18 0436  WBC 7.3 9.8 7.6 8.4  --  8.4  --   --   HGB 9.4* 8.5* 8.7* 7.5* 8.4* 7.6* 8.7* 8.3*  HCT 30.9* 28.3*  28.8* 25.4*  --  25.2*  --   --   MCV 81.5 82.5 81.8 82.5  --  82.1  --   --   PLT 317 313 285 247  --  237  --   --     Cardiac Enzymes: Recent Labs  Lab 12/05/18 1036 12/05/18 1551 12/05/18 2058 12/06/18 0336  TROPONINI <0.03 <0.03 <0.03 <0.03    BNP: Invalid input(s): POCBNP  CBG: Recent Labs  Lab 12/10/18 1629 12/10/18 2109 12/11/18 0602 12/11/18 0757 12/11/18 0823  GLUCAP 119* 153* 51* 50* 60    Microbiology: Results for orders placed or performed during the hospital encounter of 05/06/17  Blood Culture (routine x 2)     Status: None   Collection Time: 05/06/17  7:02 PM  Result Value Ref Range Status   Specimen Description BLOOD RIGHT HAND  Final   Special Requests   Final    BOTTLES DRAWN AEROBIC AND ANAEROBIC Blood Culture adequate volume   Culture NO GROWTH 5 DAYS  Final   Report Status 05/11/2017 FINAL  Final  Blood Culture (routine x 2)     Status: None   Collection Time: 05/06/17  7:02 PM  Result Value Ref Range Status   Specimen Description BLOOD LEFT AC  Final   Special Requests   Final    BOTTLES DRAWN  AEROBIC AND ANAEROBIC Blood Culture adequate volume   Culture NO GROWTH 5 DAYS  Final   Report Status 05/11/2017 FINAL  Final  Urine culture     Status: None   Collection Time: 05/06/17  7:18 PM  Result Value Ref Range Status   Specimen Description URINE, RANDOM  Final   Special Requests NONE  Final   Culture   Final    NO GROWTH Performed at [facility], [place] [address] [place], [place] [address]    Report Status 05/08/2017 FINAL  Final  Gastrointestinal Panel by PCR , Stool     Status: Abnormal   Collection Time: 05/07/17  4:44 AM  Result Value Ref Range Status   Campylobacter species NOT DETECTED NOT DETECTED Final   Plesimonas shigelloides NOT DETECTED NOT DETECTED Final   Salmonella species DETECTED (A) NOT DETECTED Final    Comment: RESULT CALLED TO, READ BACK BY AND VERIFIED WITH: BETH BUONO 05/07/17 0642 ALV    Yersinia  enterocolitica NOT DETECTED NOT DETECTED Final   Vibrio species NOT DETECTED NOT DETECTED Final   Vibrio cholerae NOT DETECTED NOT DETECTED Final   Enteroaggregative E coli (EAEC) NOT DETECTED NOT DETECTED Final   Enteropathogenic E coli (EPEC) NOT DETECTED NOT DETECTED Final   Enterotoxigenic E coli (ETEC) NOT DETECTED NOT DETECTED Final   Shiga like toxin producing E coli (STEC) NOT DETECTED NOT DETECTED Final   E. coli O157 NOT DETECTED NOT DETECTED Final   Shigella/Enteroinvasive E coli (EIEC) NOT DETECTED NOT DETECTED Final   Cryptosporidium NOT DETECTED NOT DETECTED Final   Cyclospora cayetanensis NOT DETECTED NOT DETECTED Final   Entamoeba histolytica NOT DETECTED NOT DETECTED Final   Giardia lamblia NOT DETECTED NOT DETECTED Final   Adenovirus F40/41 NOT DETECTED NOT DETECTED Final   Astrovirus NOT DETECTED NOT DETECTED Final   Norovirus GI/GII NOT DETECTED NOT DETECTED Final   Rotavirus A NOT DETECTED NOT DETECTED Final   Sapovirus (I, II, IV, and V) NOT DETECTED NOT DETECTED Final  C difficile quick scan w PCR reflex     Status: None   Collection Time: 05/07/17  4:44 AM  Result Value Ref Range Status   C Diff antigen NEGATIVE NEGATIVE Final   C Diff toxin NEGATIVE NEGATIVE Final   C Diff interpretation No C. difficile detected.  Final  MRSA PCR Screening     Status: None   Collection Time: 05/07/17  6:24 AM  Result Value Ref Range Status   MRSA by PCR NEGATIVE NEGATIVE Final    Comment:        The GeneXpert MRSA Assay (FDA approved for NASAL specimens only), is one component of a comprehensive MRSA colonization surveillance program. It is not intended to diagnose MRSA infection nor to guide or monitor treatment for MRSA infections.     Coagulation Studies: No results for input(s): LABPROT, INR in the last 72 hours.  Urinalysis: No results for input(s): COLORURINE, LABSPEC, PHURINE, GLUCOSEU, HGBUR, BILIRUBINUR, KETONESUR, PROTEINUR, UROBILINOGEN, NITRITE,  LEUKOCYTESUR in the last 72 hours.  Invalid input(s): APPERANCEUR    Imaging: Dg Chest Port 1 View  Result Date: 12/10/2018 CLINICAL DATA:  Patient with shortness of breath. EXAM: PORTABLE CHEST 1 VIEW COMPARISON:  Chest radiograph 12/05/2018 FINDINGS: Monitoring leads overlie the patient. Stable cardiomegaly. Interval development of bilateral interstitial pulmonary opacities. No large pleural effusion or pneumothorax. IMPRESSION: Cardiomegaly and interstitial opacities favored to represent edema. Electronically Signed   By: Lovey Newcomer M.D.   On: 12/10/2018  15:32     Medications:    . allopurinol  200 mg Oral BID  . amLODipine  5 mg Oral BID  . citalopram  10 mg Oral Daily  . docusate sodium  100 mg Oral BID  . fluticasone  2 spray Each Nare Daily  . furosemide  20 mg Oral Daily  . insulin aspart  0-9 Units Subcutaneous TID WC  . insulin glargine  60 Units Subcutaneous QHS  . isosorbide mononitrate  60 mg Oral BID  . magnesium gluconate  500 mg Oral BID  . methocarbamol  500 mg Oral BID  . metoprolol tartrate  75 mg Oral BID  . mometasone-formoterol  2 puff Inhalation BID  . pantoprazole  40 mg Oral BID  . ranolazine  500 mg Oral BID  . rosuvastatin  40 mg Oral QPM  . sodium chloride flush  10 mL Intravenous Q12H   acetaminophen **OR** acetaminophen, alum & mag hydroxide-simeth, bisacodyl, chlorpheniramine-HYDROcodone, nitroGLYCERIN, ondansetron **OR** ondansetron (ZOFRAN) IV, sodium phosphate, traZODone  Assessment/ Plan:  [name] is a 78 y.o. white male with prostate cancer, diabetes mellitus type 2, diverticulosis, gout, coronary artery disease, hyperlipidemia, hypertension, peripheral vascular disease, skin cancer, obstructive sleep apnea was admitted with unstable angina.    1. 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  GFR appears to be at its baseline.   - Holding losartan    2.  Hypertension.  Blood pressure at  goal.   - furosemide, amlodipine, metoprolol. Monitor potassium   3.  Anemia chronic kidney disease.  Status post PRBC transfusion.  - IV iron.  - Follow up with hematology - Consider Epogen as an outpatient.  4.  Gout.   Continue allopurinol 200 mg p.o. twice daily   5. Coronary artery disease - medical management    LOS: 6 [place] 1/2/202011:22 AM

## 2018-12-11 NOTE — Progress Notes (Signed)
Inpatient Diabetes Program Recommendations  AACE/ADA: New Consensus Statement on Inpatient Glycemic Control (2019)  Target Ranges:  Prepandial:   less than 140 mg/dL      Peak postprandial:   less than 180 mg/dL (1-2 hours)      Critically ill patients:  140 - 180 mg/dL   Results for Mike Decker, Mike Decker" (MRN [id number]) as of 12/11/2018 14:17  Ref. Range 12/11/2018 06:02 12/11/2018 07:57 12/11/2018 08:23 12/11/2018 11:58  Glucose-Capillary Latest Ref Range: 70 - 99 mg/dL 51 (L) 50 (L) 76 86  Results for Mike Decker, Mike S "[name]" (MRN [id number]) as of 12/11/2018 14:17  Ref. Range 12/10/2018 08:07 12/10/2018 11:37 12/10/2018 16:29 12/10/2018 21:09  Glucose-Capillary Latest Ref Range: 70 - 99 mg/dL 78 105 (H) 119 (H) 153 (H)   Review of Glycemic Control  Diabetes history: DM2 Outpatient Diabetes medications: Lantus 62 units QHS, Novolog 14 units with breakfast, Novolog 24 units with lunch and supper Current orders for Inpatient glycemic control: Lantus 60 units QHS, Novolog 0-9 units TID with meals  Inpatient Diabetes Program Recommendations:  Insulin - Basal: Noted fasting glucose 51 mg/dl today and Lantus was decreased from 62 to 60 units QHS today. Please consider decreasing Lantus further to 55 units QHS.  Thanks, Barnie Alderman, RN, MSN, CDE Diabetes Coordinator Inpatient Diabetes Program [phone] (Team Pager from 8am to 5pm)

## 2018-12-11 NOTE — Care Management Note (Addendum)
Case Management Note  Patient Details  Name: COLSON BARCO MRN: [id number] Date of Birth: 1941/05/13  Subjective/Objective:   Patient is from home with wife.  Admitted with NSTEMI.  His is currently on 4L oxygen with pulmonary edema.  Pulmonary consult pending for hemoptysis.  VQ scan performed today to rule out PE.  HE is current with his PCP.  Denies difficulties obtaining medications at [facility] and [facility].  He states he is "wobblely" with ambulation and more so now.  Received PRBC's for anemia.  Offered home health services at discharge and he is not interested at this time.  Will readdress as he gets closer to discharge.        [facility] referral made.              Action/Plan:   Expected Discharge Date:  12/09/18               Expected Discharge Plan:  Home/Self Care  In-House Referral:     Discharge planning Services  CM Consult  Post Acute Care Choice:    Choice offered to:     DME Arranged:    DME Agency:     HH Arranged:    HH Agency:     Status of Service:  In process, will continue to follow  If discussed at Long Length of Stay Meetings, dates discussed:    Additional Comments:  Elza Rafter, RN 12/11/2018, 1:58 PM

## 2018-12-12 DIAGNOSIS — D649 Anemia, unspecified: Secondary | ICD-10-CM

## 2018-12-12 DIAGNOSIS — D500 Iron deficiency anemia secondary to blood loss (chronic): Secondary | ICD-10-CM

## 2018-12-12 LAB — GLUCOSE, CAPILLARY
Glucose-Capillary: 63 mg/dL — ABNORMAL LOW (ref 70–99)
Glucose-Capillary: 118 mg/dL — ABNORMAL HIGH (ref 70–99)
Glucose-Capillary: 121 mg/dL — ABNORMAL HIGH (ref 70–99)
Glucose-Capillary: 70 mg/dL (ref 70–99)
Glucose-Capillary: 136 mg/dL — ABNORMAL HIGH (ref 70–99)
Glucose-Capillary: 95 mg/dL (ref 70–99)

## 2018-12-12 LAB — TROPONIN I
Troponin I: 0.03 ng/mL (ref <0.03)
Troponin I: 0.03 ng/mL (ref <0.03)
Troponin I: 0.04 ng/mL (ref <0.03)

## 2018-12-12 LAB — BASIC METABOLIC PANEL
Anion gap: 8 (ref 5–15)
BUN: 50 mg/dL — ABNORMAL HIGH (ref 8–23)
CO2: 21 mmol/L — ABNORMAL LOW (ref 22–32)
Calcium: 8.3 mg/dL — ABNORMAL LOW (ref 8.9–10.3)
Chloride: 106 mmol/L (ref 98–111)
Creatinine, Ser: 2.66 mg/dL — ABNORMAL HIGH (ref 0.61–1.24)
GFR calc Af Amer: 25 mL/min — ABNORMAL LOW (ref >60)
GFR calc non Af Amer: 22 mL/min — ABNORMAL LOW (ref >60)
Glucose, Bld: 76 mg/dL (ref 70–99)
Potassium: 4.1 mmol/L (ref 3.5–5.1)
Sodium: 135 mmol/L (ref 135–145)

## 2018-12-12 LAB — HEMOGLOBIN: Hemoglobin: 8 g/dL — ABNORMAL LOW (ref 13.0–17.0)

## 2018-12-12 MED ORDER — GUAIFENESIN-DM 100-10 MG/5ML PO SYRP
5.0000 mL | ORAL_SOLUTION | ORAL | Status: DC | PRN
  Administered 2018-12-12 – 2018-12-14 (×2): 5 mL via ORAL
  Filled 2018-12-12 (×2): qty 5

## 2018-12-12 MED ORDER — INSULIN GLARGINE 100 UNIT/ML ~~LOC~~ SOLN
50.0000 [IU] | Freq: Every day | SUBCUTANEOUS | Status: DC
  Filled 2018-12-12: qty 0.5

## 2018-12-12 MED ORDER — PEG 3350-KCL-NA BICARB-NACL 420 G PO SOLR
4000.0000 mL | Freq: Once | ORAL | Status: AC
  Administered 2018-12-13: 4000 mL via ORAL
  Filled 2018-12-12: qty 4000

## 2018-12-12 MED ORDER — INSULIN GLARGINE 100 UNIT/ML ~~LOC~~ SOLN
35.0000 [IU] | Freq: Every day | SUBCUTANEOUS | Status: DC
  Administered 2018-12-12 – 2018-12-14 (×3): 35 [IU] via SUBCUTANEOUS
  Filled 2018-12-12 (×4): qty 0.35

## 2018-12-12 NOTE — Progress Notes (Addendum)
1900: Pt refused bed alarm but was educated about safty. Will continue to monitor.  Pt Blood sugar is at 95  Was given apple juice and 2 pack of graham crackers at 2133 talked to dr. Jerelyn Charles and ordered to change the lantus from 50 units to 35 units include now. Will continue to monitor.  Update 0036: Pt blood sugar at 183. Notify prime. Will continue to monitor.

## 2018-12-12 NOTE — Progress Notes (Signed)
[name] [facility] [place] NAME: Mike Decker    MR#:  [id number]  DATE OF BIRTH:  03-Apr-1941  SUBJECTIVE:  Patient still has hemoptysis, shortness of breath. on oxygen by nasal cannula 2 L. He is on CPAP at night.  He also complains of back pain. REVIEW OF SYSTEMS:  CONSTITUTIONAL: No fever, no dizziness, has generalized weakness.  EYES: No blurred or double vision.  EARS, NOSE, AND THROAT: No tinnitus or ear pain.  RESPIRATORY:  has cough, hemoptysis and shortness of breath, no wheezing. CARDIOVASCULAR: No chest pain, orthopnea, edema.  GASTROINTESTINAL: No nausea, vomiting, diarrhea or abdominal pain.  GENITOURINARY: No dysuria, hematuria.  ENDOCRINE: No polyuria, nocturia,  HEMATOLOGY: No anemia, easy bruising or bleeding SKIN: No rash or lesion. MUSCULOSKELETAL: No joint pain or arthritis.  Back pain. NEUROLOGIC: No tingling, numbness, weakness.  PSYCHIATRY: No anxiety or depression.   ROS  DRUG ALLERGIES:   Allergies  Allergen Reactions  . Gabapentin     Other reaction(s): Other (See Comments) Tremors  . Peanut-Containing Drug Products Anaphylaxis  . Penicillins Hives and Rash    Has patient had a PCN reaction causing immediate rash, facial/tongue/throat swelling, SOB or lightheadedness with hypotension: Yes Has patient had a PCN reaction causing severe rash involving mucus membranes or skin necrosis: No Has patient had a PCN reaction that required hospitalization No Has patient had a PCN reaction occurring within the last 10 years: No If all of the above answers are "NO", then may proceed with Cephalosporin use.  . Bee Venom Swelling  . Influenza Vaccines Hives  . Inh [Isoniazid] Hives  . Kenalog [Triamcinolone Acetonide] Hives  . Levaquin [Levofloxacin] Other (See Comments)    Tendon, ligament pain.   . Nalfon [Fenoprofen Calcium] Hives  . Naproxen   . Peanut Oil   . Nsaids Rash    Nalfon 600 Nalfon 600    VITALS:   Blood pressure (!) 143/52, pulse (!) 58, temperature 98.2 F (36.8 C), temperature source Oral, resp. rate 19, height 5\' 6"  (1.676 m), weight 87.7 kg, SpO2 100 %.  PHYSICAL EXAMINATION:  GENERAL:  78 y.o.-year-old patient lying in the bed with no acute distress.  Obesity. EYES: Pupils equal, round, reactive to light and accommodation. No scleral icterus. Extraocular muscles intact.  HEENT: Head atraumatic, normocephalic. Oropharynx and nasopharynx clear.  NECK:  Supple, no jugular venous distention. No thyroid enlargement, no tenderness.  LUNGS: Normal breath sounds bilaterally, no wheezing, rhonchi or crepitation.  No basilar crackles.  No use of accessory muscles of respiration.  CARDIOVASCULAR: S1, S2 normal. No murmurs, rubs, or gallops.  ABDOMEN: Soft, nontender, nondistended. Bowel sounds present. No organomegaly or mass.  EXTREMITIES: No pedal edema, cyanosis, or clubbing.  NEUROLOGIC: Cranial nerves II through XII are intact. Muscle strength 5/5 in all extremities. Sensation intact. Gait not checked.  PSYCHIATRIC: The patient is alert and oriented x 3.  SKIN: No obvious rash, lesion, or ulcer.   Physical Exam LABORATORY PANEL:   CBC Recent Labs  Lab 12/09/18 0316  12/12/18 0240  WBC 8.4  --   --   HGB 7.6*   < > 8.0*  HCT 25.2*  --   --   PLT 237  --   --    < > = values in this interval not displayed.   ------------------------------------------------------------------------------------------------------------------  Chemistries  Recent Labs  Lab 12/11/18 0435 12/12/18 0240  NA 135 135  K 4.3 4.1  CL 108 106  CO2  19* 21*  GLUCOSE 64* 76  BUN 44* 50*  CREATININE 2.40* 2.66*  CALCIUM 8.5* 8.3*  MG 2.4  --    ------------------------------------------------------------------------------------------------------------------  Cardiac Enzymes Recent Labs  Lab 12/12/18 0240 12/12/18 0632  TROPONINI 0.03* 0.03*    ------------------------------------------------------------------------------------------------------------------  RADIOLOGY:  Ct Chest High Resolution  Result Date: 12/11/2018 CLINICAL DATA:  Inpatient. Hemoptysis. Dyspnea. Generalized weakness. Hypoxia. EXAM: CT CHEST WITHOUT CONTRAST TECHNIQUE: Multidetector CT imaging of the chest was performed following the standard protocol without intravenous contrast. High resolution imaging of the lungs, as well as inspiratory and expiratory imaging, was performed. COMPARISON:  05/06/2017 chest CT angiogram. Chest radiograph from one day prior. FINDINGS: Cardiovascular: Top-normal heart size. No significant pericardial effusion/thickening. Three-vessel coronary atherosclerosis. Aortic valvular calcification. Atherosclerotic nonaneurysmal thoracic aorta. Normal caliber pulmonary arteries. Mediastinum/Nodes: No discrete thyroid nodules. Unremarkable esophagus. No axillary adenopathy. Mildly enlarged 1.2 cm subcarinal node (series 2/image 73), unchanged since 05/06/2017 chest CT. No new pathologically enlarged mediastinal nodes. No discrete hilar adenopathy on this noncontrast scan. Lungs/Pleura: No pneumothorax. Small dependent bilateral pleural effusions. No acute consolidative airspace disease, lung masses or significant pulmonary nodules. Scattered small calcified granulomas throughout both lungs. No significant air trapping on the expiration sequence. There is patchy ground-glass opacity and interlobular septal thickening throughout both lungs, most prominent in the dependent lower lungs. There is mild patchy subpleural reticulation with minimal traction bronchiolectasis in both lungs with a mild basilar predominance. No frank honeycombing. Upper abdomen: Small hiatal hernia. Hypodense 3.0 cm right liver dome mass (series 2/image 103), unchanged since 03/29/2014 CT, where it was seen to represent a hemangioma. Musculoskeletal: No aggressive appearing focal  osseous lesions. Mild thoracic spondylosis. IMPRESSION: 1. Aortic valvular calcification, which can be correlated with aortic valvular stenosis. Consider echocardiographic correlation. Top-normal heart size. 2. Small dependent bilateral pleural effusions. 3. Patchy ground-glass opacity and interlobular septal thickening, most prominent in the dependent lower lungs. Mild patchy subpleural reticulation with minimal traction bronchiolectasis, with a mild basilar predominance. No honeycombing. 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 A follow-up high-resolution chest CT study could be obtained in 12 months to assess temporal pattern stability, as clinically warranted. 4. Three-vessel coronary atherosclerosis. 5. Small hiatal hernia. Aortic Atherosclerosis (ICD10-I70.0). Electronically Signed   By: Ilona Sorrel M.D.   On: 12/11/2018 20:26   Nm Pulmonary Perf And Vent  Result Date: 12/11/2018 CLINICAL DATA:  Acute onset of shortness of breath. Hemoptysis. Chronic renal failure precludes IV contrast administration. EXAM: NUCLEAR MEDICINE VENTILATION - PERFUSION LUNG SCAN TECHNIQUE: Ventilation images were obtained in multiple projections using inhaled aerosol Tc-80m DTPA. Perfusion images were obtained in multiple projections after intravenous injection of Tc-77m MAA. RADIOPHARMACEUTICALS:  33.3 mCi of Tc-25m DTPA aerosol inhalation and 4.3 mCi Tc26m MAA IV COMPARISON:  No prior nuclear imaging. Chest x-rays 12/10/2018, 12/05/2018 and earlier. FINDINGS: Ventilation: No focal ventilatory defect. Ingested aerosol in the esophagus and stomach. Perfusion: Normal perfusion. No wedge-shaped perfusion defects to indicate pulmonary embolism. IMPRESSION: Normal examination. Electronically Signed   By: Evangeline Dakin M.D.   On:  12/11/2018 13:05   Dg Chest Port 1 View  Result Date: 12/10/2018 CLINICAL DATA:  Patient with shortness of breath. EXAM: PORTABLE CHEST 1 VIEW COMPARISON:  Chest radiograph 12/05/2018 FINDINGS: Monitoring leads overlie the patient. Stable cardiomegaly. Interval development of bilateral interstitial pulmonary opacities. No large pleural effusion or pneumothorax. IMPRESSION: Cardiomegaly and  interstitial opacities favored to represent edema. Electronically Signed   By: Lovey Newcomer M.D.   On: 12/10/2018 15:32    ASSESSMENT AND PLAN:  *Acute unstable angina  Resolved on current regiment. Noted abnormal stress test on Thursday He was on heparin drip. Continue aspirin, Plavix, nitrates as needed, Ranexa, beta-blocker therapy, statin therapy, IV morphine for breakthrough pain, supplemental oxygen. Cardiac catheterization:  Successful cardiac cath with mild to moderate diffuse coronary disease throughout No significant high-grade obstructive coronary disease Patient appears to have possible microvascular disease LV gram was deferred because of renal insufficiency Continue aspirin and Plavix per Dr. Clayborn Bigness. Aspirin and Plavix is on hold due to hemoptysis.  *CKD stage III BL Cr around 2.1, follow-up BMP while on Lasix.  *Chronic diabetes mellitus type 2  Continue sliding scale insulin with Accu-Cheks per routine, levemir, cardiac/carbohydrate consistent diet  *Anemia of chronic disease.  Iron deficiency.   Per Dr. Tasia Catchings, IV rion x 2.  Follow-up her and [facility] physician as outpatient.  Hemoglobin down to 7.6, since the patient has symptomatic anemia and acute unstable angina, given 1 unit PRBC transfusion. Hb up to 8.7. follow-up hemoglobin and stool occult.  No active bleeding. Hb 8.0.  *Chronic obstructive sleep apnea CPAP at at bedtime/as needed  Hypertension.  Continue Norvasc and Lopressor, resume Lasix and losartan. Obesity.  Diet control and follow-up with PCP.  Generalized weakness.   The patient may need home health and PT.  Acute respiratory failure with hypoxia and hemoptysis.  D-dimer is elevated. 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 VQ scan: Normal examination.  Pulmonary consult from Dr. Humphrey Rolls. Aspirin and Plavix on hold due to hemoptysis. Per Dr. Humphrey Rolls, Hemoptysis multifactorial probably related to anticoagulation which was started with aspirin and Plavix also possibility that is related to underlying pulmonary edema.  Try to wean off oxygen.  But the patient may need home oxygen.  Continue nebulizer and Lasix.  Acute on chronic diastolic CHF, recent echo showed ejection fraction 60%. Continue p.o. Lasix 20 mg daily.  Generalized weakness.  He has recurrent admissions and need home health. Discussed with Dr. Juleen China. All the records are reviewed and case discussed with Care Management/Social Workerr. Management plans discussed with the patient, his wife and they are in agreement.  CODE STATUS: full  TOTAL TIME TAKING CARE OF THIS PATIENT: 33 minutes.  POSSIBLE D/C IN 2 DAYS, DEPENDING ON CLINICAL CONDITION.   Demetrios Loll M.D on 12/12/2018   Between 7am to 6pm - Pager - [phone]  After 6pm go to [email or web address] - password [email or web address] [facility]  Office  [phone]  CC: Primary care physician; Cletis Athens, MD  Note: This dictation was prepared with Dragon dictation along with smaller phrase technology. Any transcriptional errors that result from this process are unintentional.

## 2018-12-12 NOTE — Progress Notes (Signed)
Hematology/Oncology Progress Note [facility] Telephone:([phone] [phone]  Patient Care Team: Cletis Athens, MD as PCP - General (Cardiology) Cletis Athens, MD as Referring Physician (Cardiology) Christene Lye, MD (General Surgery) Alfonzo Feller, RN as [place] [facility]   Name of the patient: Mike Decker  [id number]  1941-08-28  Date of visit: 12/12/18   INTERVAL HISTORY-  Patient received IV Venofer 200 mg x 3 without any issue. Also received 1 unit of PRBC blood transfusion due to symptomatic anemia. Hemoglobin went up to 8.7 on 12/10/2018, slowly trending down to 8.0. Reports feeling weak and tired.  Shortness of breath with exertion. Breathing comfortably on nasal cannula oxygen at rest. Denies any chest pain. VQ scan showed low probability of PE. High-resolution CT chest showed pulmonary edema, pulmonary interstitial fibrosis.    Review of systems- Review of Systems  Constitutional: Positive for fatigue. Negative for appetite change, chills, fever and unexpected weight change.  HENT:   Negative for hearing loss and voice change.   Eyes: Negative for eye problems and icterus.  Respiratory: Positive for shortness of breath. Negative for chest tightness and cough.   Cardiovascular: Negative for chest pain and leg swelling.  Gastrointestinal: Negative for abdominal distention and abdominal pain.  Endocrine: Negative for hot flashes.  Genitourinary: Negative for difficulty urinating, dysuria and frequency.   Musculoskeletal: Negative for arthralgias.  Skin: Negative for itching and rash.  Neurological: Negative for light-headedness and numbness.  Hematological: Negative for adenopathy. Does not bruise/bleed easily.  Psychiatric/Behavioral: Negative for confusion.    Allergies  Allergen Reactions  . Gabapentin     Other reaction(s): Other (See Comments) Tremors  . Peanut-Containing Drug  Products Anaphylaxis  . Penicillins Hives and Rash    Has patient had a PCN reaction causing immediate rash, facial/tongue/throat swelling, SOB or lightheadedness with hypotension: Yes Has patient had a PCN reaction causing severe rash involving mucus membranes or skin necrosis: No Has patient had a PCN reaction that required hospitalization No Has patient had a PCN reaction occurring within the last 10 years: No If all of the above answers are "NO", then may proceed with Cephalosporin use.  . Bee Venom Swelling  . Influenza Vaccines Hives  . Inh [Isoniazid] Hives  . Kenalog [Triamcinolone Acetonide] Hives  . Levaquin [Levofloxacin] Other (See Comments)    Tendon, ligament pain.   . Nalfon [Fenoprofen Calcium] Hives  . Naproxen   . Peanut Oil   . Nsaids Rash    Nalfon 600 Nalfon 600    Patient Active Problem List   Diagnosis Date Noted  . SOB (shortness of breath)   . Iron deficiency anemia due to chronic blood loss   . Unstable angina ([place])   . Anemia of chronic renal failure, stage 4 (severe) ([place])   . Chest pain 11/14/2018  . CAD (coronary artery disease) 02/27/2018  . Sacroiliac joint pain 10/08/2017  . Pain in limb 09/24/2017  . Neuropathy 07/24/2017  . NSTEMI (non-ST elevated myocardial infarction) ([place]) 05/06/2017  . PAD (peripheral artery disease) ([place]) 05/01/2017  . History of stroke 01/02/2017  . Barrett's esophagus 10/02/2016  . Carotid stenosis 10/02/2016  . Essential hypertension 10/02/2016  . Hyperlipidemia 10/02/2016  . Diabetes ([place]) 10/02/2016  . 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 Uncontrolled type 2 diabetes mellitus with hyperglycemia, with long-term current use of insulin ([place]) 04/10/2016  . TIA (transient ischemic attack) 10/08/2015     Past Medical History:  Diagnosis Date  . Arthritis   . Atrioventricular canal (AVC)    irregular heart beats  . Barrett esophagus   . Cancer [facility]) 2002   prostate  . Chronic  diastolic CHF (congestive heart failure) ([place])   . Colon polyp   . Diabetes mellitus without complication ([place])   . Diverticulosis   . Gout   . Heart disease   . Hemangioma    liver  . Hyperlipidemia   . Hypertension   . Myocardial infarct ([place])   . Ocular hypertension   . Peripheral vascular disease ([place])   . Skin cancer   . Sleep apnea   . Vitreoretinal degeneration      Past Surgical History:  Procedure Laterality Date  . CATARACT EXTRACTION  2011, 2012  . CORONARY ANGIOPLASTY WITH STENT PLACEMENT  2012  . ESOPHAGOGASTRODUODENOSCOPY (EGD) WITH PROPOFOL N/A 06/01/2016   Procedure: ESOPHAGOGASTRODUODENOSCOPY (EGD) WITH PROPOFOL;  Surgeon: Manya Silvas, MD;  Location: [facility] ENDOSCOPY;  Service: Endoscopy;  Laterality: N/A;  . HERNIA REPAIR     x2  . INTRAOCULAR LENS INSERTION    . LEFT HEART CATH AND CORONARY ANGIOGRAPHY N/A 05/09/2017   Procedure: Left Heart Cath and Coronary Angiography;  Surgeon: Yolonda Kida, MD;  Location: [place] CV LAB;  Service: Cardiovascular;  Laterality: N/A;  . LEFT HEART CATH AND CORONARY ANGIOGRAPHY N/A 12/08/2018   Procedure: LEFT HEART CATH AND CORONARY ANGIOGRAPHY and possible PCI and stent;  Surgeon: Yolonda Kida, MD;  Location: [place] CV LAB;  Service: Cardiovascular;  Laterality: N/A;  . NOSE SURGERY     submucous resection  . PROSTATE SURGERY  2002  . ROTATOR CUFF REPAIR Right     Social History   Socioeconomic History  . Marital status: Married    Spouse name: Not on file  . Number of children: Not on file  . Years of education: Not on file  . Highest education level: Not on file  Occupational History  . Not on file  Social Needs  . Financial resource strain: Not on file  . Food insecurity:    Worry: Not on file    Inability: Not on file  . Transportation needs:    Medical: Not on file    Non-medical: Not on file  Tobacco Use  . Smoking status: Former Smoker    Years: 20.00    Types: Cigarettes     Last attempt to quit: 12/10/1976    Years since quitting: 42.0  . Smokeless tobacco: Never Used  Substance and Sexual Activity  . Alcohol use: No  . Drug use: No  . Sexual activity: Not on file  Lifestyle  . Physical activity:    Days per week: Not on file    Minutes per session: Not on file  . Stress: Not on file  Relationships  . Social connections:    Talks on phone: Not on file    Gets together: Not on file    Attends religious service: Not on file    Active member of club or organization: Not on file    Attends meetings of clubs or organizations: Not on file    Relationship status: Not on file  . Intimate partner violence:    Fear of current or ex partner: Not on file    Emotionally abused: Not on file    Physically abused: Not on file    Forced sexual activity: Not on file  Other Topics Concern  . Not on file  Social History Narrative  . Not on file     Family History  Problem Relation Age of Onset  . Stroke Maternal Grandfather      Current Facility-Administered Medications:  .  acetaminophen (TYLENOL) tablet 650 mg, 650 mg, Oral, Q6H PRN, 650 mg at 12/11/18 2123 **OR** acetaminophen (TYLENOL) suppository 650 mg, 650 mg, Rectal, Q6H PRN, Epifanio Lesches, MD .  allopurinol (ZYLOPRIM) tablet 200 mg, 200 mg, Oral, BID, Epifanio Lesches, MD, 200 mg at 12/12/18 0829 .  alum & mag hydroxide-simeth (MAALOX/MYLANTA) 200-200-20 MG/5ML suspension 15 mL, 15 mL, Oral, Q6H PRN, Demetrios Loll, MD, 15 mL at 12/11/18 2122 .  amLODipine (NORVASC) tablet 5 mg, 5 mg, Oral, BID, Epifanio Lesches, MD, 5 mg at 12/12/18 1058 .  bisacodyl (DULCOLAX) EC tablet 10 mg, 10 mg, Oral, Daily PRN, Demetrios Loll, MD, 10 mg at 12/10/18 1022 .  chlorpheniramine-HYDROcodone (TUSSIONEX) 10-8 MG/5ML suspension 5 mL, 5 mL, Oral, QHS PRN, Demetrios Loll, MD, 5 mL at 12/11/18 2122 .  citalopram (CELEXA) tablet 10 mg, 10 mg, Oral, Daily, Epifanio Lesches, MD, 10 mg at 12/12/18 0829 .  docusate  sodium (COLACE) capsule 100 mg, 100 mg, Oral, BID, Epifanio Lesches, MD, 100 mg at 12/12/18 0829 .  fluticasone (FLONASE) 50 MCG/ACT nasal spray 2 spray, 2 spray, Each Nare, Daily, Epifanio Lesches, MD, 2 spray at 12/12/18 0831 .  furosemide (LASIX) tablet 20 mg, 20 mg, Oral, Daily, Demetrios Loll, MD, 20 mg at 12/12/18 5188 .  insulin aspart (novoLOG) injection 0-9 Units, 0-9 Units, Subcutaneous, TID WC, Epifanio Lesches, MD, 1 Units at 12/12/18 [phone] .  insulin glargine (LANTUS) injection 50 Units, 50 Units, Subcutaneous, QHS, Sridharan, Prasanna, MD .  isosorbide mononitrate (IMDUR) 24 hr tablet 60 mg, 60 mg, Oral, BID, Callwood, Dwayne D, MD, 60 mg at 12/12/18 0832 .  losartan (COZAAR) tablet 25 mg, 25 mg, Oral, Daily, Demetrios Loll, MD, 25 mg at 12/11/18 1502 .  magnesium gluconate (MAGONATE) tablet 500 mg, 500 mg, Oral, BID, Demetrios Loll, MD, 500 mg at 12/12/18 0830 .  methocarbamol (ROBAXIN) tablet 500 mg, 500 mg, Oral, BID, Epifanio Lesches, MD, 500 mg at 12/12/18 0830 .  metoprolol tartrate (LOPRESSOR) tablet 75 mg, 75 mg, Oral, BID, Epifanio Lesches, MD, 75 mg at 12/12/18 1058 .  mometasone-formoterol (DULERA) 200-5 MCG/ACT inhaler 2 puff, 2 puff, Inhalation, BID, Epifanio Lesches, MD, 2 puff at 12/12/18 0830 .  nitroGLYCERIN (NITROSTAT) SL tablet 0.4 mg, 0.4 mg, Sublingual, Q5 min PRN, Salary, Montell D, MD, 0.4 mg at 12/11/18 2329 .  ondansetron (ZOFRAN) tablet 4 mg, 4 mg, Oral, Q6H PRN **OR** ondansetron (ZOFRAN) injection 4 mg, 4 mg, Intravenous, Q6H PRN, Epifanio Lesches, MD .  pantoprazole (PROTONIX) EC tablet 40 mg, 40 mg, Oral, BID, Demetrios Loll, MD, 40 mg at 12/12/18 0829 .  ranolazine (RANEXA) 12 hr tablet 500 mg, 500 mg, Oral, BID, Callwood, Dwayne D, MD, 500 mg at 12/12/18 0832 .  rosuvastatin (CRESTOR) tablet 40 mg, 40 mg, Oral, QPM, Epifanio Lesches, MD, 40 mg at 12/11/18 1728 .  sodium chloride flush (NS) 0.9 % injection 10 mL, 10 mL, Intravenous, Q12H,  Demetrios Loll, MD, 10 mL at 12/12/18 [phone] .  sodium phosphate (FLEET) 7-19 GM/118ML enema 1 enema, 1 enema, Rectal, Daily PRN, Demetrios Loll, MD, 1 enema at 12/10/18 1552 .  traZODone (DESYREL) tablet 50 mg, 50 mg, Oral, QHS PRN, Lance Coon, MD, 50 mg at 12/11/18 2123   Physical exam:  Vitals:   12/12/18 0325 12/12/18 0809  12/12/18 1029 12/12/18 1126  BP: 134/61 (!) 106/53 (!) 143/52   Pulse: (!) 50 60 65 (!) 58  Resp: 20 19    Temp: 98 F (36.7 C) 98.2 F (36.8 C)    TempSrc: Axillary Oral    SpO2: 92% 95% 98% 100%  Weight: 193 lb 6.4 oz (87.7 kg)     Height:       Physical Exam  Constitutional: He is oriented to person, place, and time. No distress.  HENT:  Head: Normocephalic and atraumatic.  Nose: Nose normal.  Mouth/Throat: Oropharynx is clear and moist. No oropharyngeal exudate.  Breathing via nasal canula oxygen  Eyes: Pupils are equal, round, and reactive to light. EOM are normal. No scleral icterus.  Neck: Normal range of motion. Neck supple.  Cardiovascular: Normal rate and regular rhythm.  Murmur heard. Pulmonary/Chest: Effort normal. No respiratory distress. He has no rales. He exhibits no tenderness.  Breathing via nasal cannula Oxygen.   Abdominal: Soft. He exhibits no distension. There is no abdominal tenderness.  Musculoskeletal: Normal range of motion.        General: No edema.  Neurological: He is alert and oriented to person, place, and time. No cranial nerve deficit.  Skin: Skin is warm and dry. He is not diaphoretic. No erythema. There is pallor.  Psychiatric: Affect normal.       CMP Latest Ref Rng & Units 12/12/2018  Glucose 70 - 99 mg/dL 76  BUN 8 - 23 mg/dL 50(H)  Creatinine 0.61 - 1.24 mg/dL 2.66(H)  Sodium 135 - 145 mmol/L 135  Potassium 3.5 - 5.1 mmol/L 4.1  Chloride 98 - 111 mmol/L 106  CO2 22 - 32 mmol/L 21(L)  Calcium 8.9 - 10.3 mg/dL 8.3(L)  Total Protein 6.5 - 8.1 g/dL -  Total Bilirubin 0.3 - 1.2 mg/dL -  Alkaline Phos 38 - 126 U/L -    AST 15 - 41 U/L -  ALT 17 - 63 U/L -   CBC Latest Ref Rng & Units 12/12/2018  WBC 4.0 - 10.5 K/uL -  Hemoglobin 13.0 - 17.0 g/dL 8.0(L)  Hematocrit 39.0 - 52.0 % -  Platelets 150 - 400 K/uL -   RADIOGRAPHIC STUDIES: I have personally reviewed the radiological images as listed and agreed with the findings in the report.  Dg Chest 1 View  Result Date: 11/16/2018 CLINICAL DATA:  History of pneumonia. EXAM: CHEST  1 VIEW COMPARISON:  11/14/2018 FINDINGS: The heart is enlarged but stable. No infiltrates, edema or effusions. The bony thorax is intact. IMPRESSION: No acute cardiopulmonary findings.  Stable cardiac enlargement. Electronically Signed   By: Marijo Sanes M.D.   On: 11/16/2018 13:11   Dg Chest 2 View  Result Date: 12/05/2018 CLINICAL DATA:  Chest pain began this morning EXAM: CHEST - 2 VIEW COMPARISON:  Chest x-ray of 11/16/2017 FINDINGS: No active infiltrate or effusion is seen. Mediastinal and hilar contours appear stable. Cardiomegaly is stable. No acute bony abnormality is seen. Coronary artery stent is noted. IMPRESSION: Stable cardiomegaly.  No active lung disease. Electronically Signed   By: Ivar Drape M.D.   On: 12/05/2018 10:47   Ct Chest High Resolution  Result Date: 12/11/2018 CLINICAL DATA:  Inpatient. Hemoptysis. Dyspnea. Generalized weakness. Hypoxia. EXAM: CT CHEST WITHOUT CONTRAST TECHNIQUE: Multidetector CT imaging of the chest was performed following the standard protocol without intravenous contrast. High resolution imaging of the lungs, as well as inspiratory and expiratory imaging, was performed. COMPARISON:  05/06/2017 chest CT angiogram. Chest  radiograph from one day prior. FINDINGS: Cardiovascular: Top-normal heart size. No significant pericardial effusion/thickening. Three-vessel coronary atherosclerosis. Aortic valvular calcification. Atherosclerotic nonaneurysmal thoracic aorta. Normal caliber pulmonary arteries. Mediastinum/Nodes: No discrete thyroid nodules.  Unremarkable esophagus. No axillary adenopathy. Mildly enlarged 1.2 cm subcarinal node (series 2/image 73), unchanged since 05/06/2017 chest CT. No new pathologically enlarged mediastinal nodes. No discrete hilar adenopathy on this noncontrast scan. Lungs/Pleura: No pneumothorax. Small dependent bilateral pleural effusions. No acute consolidative airspace disease, lung masses or significant pulmonary nodules. Scattered small calcified granulomas throughout both lungs. No significant air trapping on the expiration sequence. There is patchy ground-glass opacity and interlobular septal thickening throughout both lungs, most prominent in the dependent lower lungs. There is mild patchy subpleural reticulation with minimal traction bronchiolectasis in both lungs with a mild basilar predominance. No frank honeycombing. Upper abdomen: Small hiatal hernia. Hypodense 3.0 cm right liver dome mass (series 2/image 103), unchanged since 03/29/2014 CT, where it was seen to represent a hemangioma. Musculoskeletal: No aggressive appearing focal osseous lesions. Mild thoracic spondylosis. IMPRESSION: 1. Aortic valvular calcification, which can be correlated with aortic valvular stenosis. Consider echocardiographic correlation. Top-normal heart size. 2. Small dependent bilateral pleural effusions. 3. Patchy ground-glass opacity and interlobular septal thickening, most prominent in the dependent lower lungs. Mild patchy subpleural reticulation with minimal traction bronchiolectasis, with a mild basilar predominance. No honeycombing. 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 A follow-up high-resolution chest CT study could be obtained in 12 months to assess temporal pattern stability, as clinically warranted. 4. Three-vessel coronary  atherosclerosis. 5. Small hiatal hernia. Aortic Atherosclerosis (ICD10-I70.0). Electronically Signed   By: Ilona Sorrel M.D.   On: 12/11/2018 20:26   Nm Pulmonary Perf And Vent  Result Date: 12/11/2018 CLINICAL DATA:  Acute onset of shortness of breath. Hemoptysis. Chronic renal failure precludes IV contrast administration. EXAM: NUCLEAR MEDICINE VENTILATION - PERFUSION LUNG SCAN TECHNIQUE: Ventilation images were obtained in multiple projections using inhaled aerosol Tc-80m DTPA. Perfusion images were obtained in multiple projections after intravenous injection of Tc-63m MAA. RADIOPHARMACEUTICALS:  33.3 mCi of Tc-35m DTPA aerosol inhalation and 4.3 mCi Tc47m MAA IV COMPARISON:  No prior nuclear imaging. Chest x-rays 12/10/2018, 12/05/2018 and earlier. FINDINGS: Ventilation: No focal ventilatory defect. Ingested aerosol in the esophagus and stomach. Perfusion: Normal perfusion. No wedge-shaped perfusion defects to indicate pulmonary embolism. IMPRESSION: Normal examination. Electronically Signed   By: Evangeline Dakin M.D.   On: 12/11/2018 13:05   Dg Chest Port 1 View  Result Date: 12/10/2018 CLINICAL DATA:  Patient with shortness of breath. EXAM: PORTABLE CHEST 1 VIEW COMPARISON:  Chest radiograph 12/05/2018 FINDINGS: Monitoring leads overlie the patient. Stable cardiomegaly. Interval development of bilateral interstitial pulmonary opacities. No large pleural effusion or pneumothorax. IMPRESSION: Cardiomegaly and interstitial opacities favored to represent edema. Electronically Signed   By: Lovey Newcomer M.D.   On: 12/10/2018 15:32   Dg Chest Portable 1 View  Result Date: 11/14/2018 CLINICAL DATA:  Central chest and left arm pain. EXAM: PORTABLE CHEST 1 VIEW COMPARISON:  09/04/2018 FINDINGS: The heart size and mediastinal contours are within normal limits. Both lungs are clear. The visualized skeletal structures are unremarkable. IMPRESSION: No active disease. Electronically Signed   By: Ashley Royalty M.D.    On: 11/14/2018 22:23    Assessment and plan-  Patient is a 78 y.o. male with multiple comorbidities including hypertension, diabetes, CKD, previous  NSTEMI present for evaluation of chest pain.  # Acute on chronic anemia/ iron deficiency S/p IV venofer 200mg  x 3 as well as 1 unit of PRBC transfusion.   Hemoglobin initially went up and slowly trend down. Either he has a chronic blood loss through GI, versus due to hemodilution from volume overload. No acute bleeding inpatient.  Occult blood test pending If hemoglobin continues to drop below 8, and patient symptomatic, recommend transfuse another unit of PRBC with Lasix afterwards.  # Anemia due to chronic kidney disease,  Recent NSTEMI, cautious in erythropoietin therapy given thrombosis risk. Would not offer.  Recommend to increase ferritin level to be above 200 and rule out GI blood loss first..   Outpatient follow up.   Thank you for allowing me to participate in the care of this patient.  Total face to face encounter time for this patient visit was 25 min. >50% of the time was  spent in counseling and coordination of care.   Earlie Server, MD, PhD Hematology Oncology [facility] at [facility] Pager- [phone] 12/12/2018

## 2018-12-12 NOTE — Plan of Care (Signed)
  Problem: Health Behavior/Discharge Planning: Goal: Ability to manage health-related needs will improve Outcome: Progressing   Problem: Pain Managment: Goal: General experience of comfort will improve Outcome: Progressing   Problem: Safety: Goal: Ability to remain free from injury will improve Outcome: Progressing   

## 2018-12-12 NOTE — Progress Notes (Addendum)
Hypoglycemic Event  CBG:63  Treatment: 4 oz juice/soda  Symptoms: None  Follow-up CBG: Time:0420 CBG Result:118  Possible Reasons for Event: Pt received 55 units of Lantus last night with a blood sugar of 161.  Comments/MD notified: Dr. Shirlean Kelly [facility]   Dr. Aliene Altes Change the lantus order from 55 units to 50 units daily at bedtime. Will continue to monitor.

## 2018-12-12 NOTE — Progress Notes (Addendum)
Cardiac catheterization revealed diffuse moderate and small vessel disease without high grade stenosis to be managed medically without intervention per Dr. Clayborn Bigness. Patient is medically optimized for endoscopy and/or colonoscopy.

## 2018-12-12 NOTE — Progress Notes (Signed)
[facility]  ROUNDING NOTE   Subjective:   Continues to have hemoptysis.   Creatinine 2.66 (2.4)  Objective:  Vital signs in last 24 hours:  Temp:  [98 F (36.7 C)-98.6 F (37 C)] 98.2 F (36.8 C) (01/03 0809) Pulse Rate:  [50-65] 65 (01/03 1029) Resp:  [18-20] 19 (01/03 0809) BP: (100-143)/(39-61) 143/52 (01/03 1029) SpO2:  [92 %-100 %] 98 % (01/03 1029) Weight:  [87.7 kg] 87.7 kg (01/03 0325)  Weight change: -3.074 kg Filed Weights   12/10/18 0437 12/11/18 0559 12/12/18 0325  Weight: 89.8 kg 90.8 kg 87.7 kg    Intake/Output: I/O last 3 completed shifts: In: -  Out: 2224 [Urine:2224]   Intake/Output this shift:  Total I/O In: -  Out: 700 [Urine:700]  Physical Exam: General: No acute distress  Head: Normocephalic, atraumatic. Moist oral mucosal membranes  Eyes: Anicteric  Neck: Supple, trachea midline  Lungs:  Clear to auscultation, normal effort  Heart: S1S2 no rubs  Abdomen:  Soft, nontender, bowel sounds present  Extremities: No peripheral edema.  Neurologic: Awake, alert, following commands  Skin: No lesions        Basic Metabolic Panel: Recent Labs  Lab 12/06/18 0336 12/09/18 0316 12/11/18 0435 12/12/18 0240  NA 135 136 135 135  K 4.5 4.6 4.3 4.1  CL 103 110 108 106  CO2 22 20* 19* 21*  GLUCOSE 173* 151* 64* 76  BUN 49* 39* 44* 50*  CREATININE 2.28* 2.17* 2.40* 2.66*  CALCIUM 9.0 8.2* 8.5* 8.3*  MG  --   --  2.4  --     Liver Function Tests: No results for input(s): AST, ALT, ALKPHOS, BILITOT, PROT, ALBUMIN in the last 168 hours. No results for input(s): LIPASE, AMYLASE in the last 168 hours. No results for input(s): AMMONIA in the last 168 hours.  CBC: Recent Labs  Lab 12/06/18 0336 12/07/18 0447 12/08/18 0328 12/08/18 2115 12/09/18 0316 12/10/18 0414 12/11/18 0436 12/12/18 0240  WBC 9.8 7.6 8.4  --  8.4  --   --   --   HGB 8.5* 8.7* 7.5* 8.4* 7.6* 8.7* 8.3* 8.0*  HCT 28.3* 28.8* 25.4*  --  25.2*  --   --   --    MCV 82.5 81.8 82.5  --  82.1  --   --   --   PLT 313 285 247  --  237  --   --   --     Cardiac Enzymes: Recent Labs  Lab 12/05/18 2058 12/06/18 0336 12/11/18 2353 12/12/18 0240 12/12/18 0632  TROPONINI <0.03 <0.03 0.04* 0.03* 0.03*    BNP: Invalid input(s): POCBNP  CBG: Recent Labs  Lab 12/11/18 2059 12/11/18 2347 12/12/18 0320 12/12/18 0420 12/12/18 0810  GLUCAP 161* 109* 71* 118* 121*    Microbiology: Results for orders placed or performed during the hospital encounter of 05/06/17  Blood Culture (routine x 2)     Status: None   Collection Time: 05/06/17  7:02 PM  Result Value Ref Range Status   Specimen Description BLOOD RIGHT HAND  Final   Special Requests   Final    BOTTLES DRAWN AEROBIC AND ANAEROBIC Blood Culture adequate volume   Culture NO GROWTH 5 DAYS  Final   Report Status 05/11/2017 FINAL  Final  Blood Culture (routine x 2)     Status: None   Collection Time: 05/06/17  7:02 PM  Result Value Ref Range Status   Specimen Description BLOOD LEFT AC  Final  Special Requests   Final    BOTTLES DRAWN AEROBIC AND ANAEROBIC Blood Culture adequate volume   Culture NO GROWTH 5 DAYS  Final   Report Status 05/11/2017 FINAL  Final  Urine culture     Status: None   Collection Time: 05/06/17  7:18 PM  Result Value Ref Range Status   Specimen Description URINE, RANDOM  Final   Special Requests NONE  Final   Culture   Final    NO GROWTH Performed at [facility], [place] [address] [place], [place] [address]    Report Status 05/08/2017 FINAL  Final  Gastrointestinal Panel by PCR , Stool     Status: Abnormal   Collection Time: 05/07/17  4:44 AM  Result Value Ref Range Status   Campylobacter species NOT DETECTED NOT DETECTED Final   Plesimonas shigelloides NOT DETECTED NOT DETECTED Final   Salmonella species DETECTED (A) NOT DETECTED Final    Comment: RESULT CALLED TO, READ BACK BY AND VERIFIED WITH: BETH BUONO 05/07/17 0642 ALV    Yersinia  enterocolitica NOT DETECTED NOT DETECTED Final   Vibrio species NOT DETECTED NOT DETECTED Final   Vibrio cholerae NOT DETECTED NOT DETECTED Final   Enteroaggregative E coli (EAEC) NOT DETECTED NOT DETECTED Final   Enteropathogenic E coli (EPEC) NOT DETECTED NOT DETECTED Final   Enterotoxigenic E coli (ETEC) NOT DETECTED NOT DETECTED Final   Shiga like toxin producing E coli (STEC) NOT DETECTED NOT DETECTED Final   E. coli O157 NOT DETECTED NOT DETECTED Final   Shigella/Enteroinvasive E coli (EIEC) NOT DETECTED NOT DETECTED Final   Cryptosporidium NOT DETECTED NOT DETECTED Final   Cyclospora cayetanensis NOT DETECTED NOT DETECTED Final   Entamoeba histolytica NOT DETECTED NOT DETECTED Final   Giardia lamblia NOT DETECTED NOT DETECTED Final   Adenovirus F40/41 NOT DETECTED NOT DETECTED Final   Astrovirus NOT DETECTED NOT DETECTED Final   Norovirus GI/GII NOT DETECTED NOT DETECTED Final   Rotavirus A NOT DETECTED NOT DETECTED Final   Sapovirus (I, II, IV, and V) NOT DETECTED NOT DETECTED Final  C difficile quick scan w PCR reflex     Status: None   Collection Time: 05/07/17  4:44 AM  Result Value Ref Range Status   C Diff antigen NEGATIVE NEGATIVE Final   C Diff toxin NEGATIVE NEGATIVE Final   C Diff interpretation No C. difficile detected.  Final  MRSA PCR Screening     Status: None   Collection Time: 05/07/17  6:24 AM  Result Value Ref Range Status   MRSA by PCR NEGATIVE NEGATIVE Final    Comment:        The GeneXpert MRSA Assay (FDA approved for NASAL specimens only), is one component of a comprehensive MRSA colonization surveillance program. It is not intended to diagnose MRSA infection nor to guide or monitor treatment for MRSA infections.     Coagulation Studies: No results for input(s): LABPROT, INR in the last 72 hours.  Urinalysis: Recent Labs    12/11/18 1512  COLORURINE YELLOW*  LABSPEC 1.013  PHURINE 5.0  GLUCOSEU NEGATIVE  HGBUR NEGATIVE  BILIRUBINUR  NEGATIVE  KETONESUR NEGATIVE  PROTEINUR NEGATIVE  NITRITE NEGATIVE  LEUKOCYTESUR NEGATIVE      Imaging: Ct Chest High Resolution  Result Date: 12/11/2018 CLINICAL DATA:  Inpatient. Hemoptysis. Dyspnea. Generalized weakness. Hypoxia. EXAM: CT CHEST WITHOUT CONTRAST TECHNIQUE: Multidetector CT imaging of the chest was performed following the standard protocol without intravenous contrast. High resolution imaging of the lungs, as  well as inspiratory and expiratory imaging, was performed. COMPARISON:  05/06/2017 chest CT angiogram. Chest radiograph from one day prior. FINDINGS: Cardiovascular: Top-normal heart size. No significant pericardial effusion/thickening. Three-vessel coronary atherosclerosis. Aortic valvular calcification. Atherosclerotic nonaneurysmal thoracic aorta. Normal caliber pulmonary arteries. Mediastinum/Nodes: No discrete thyroid nodules. Unremarkable esophagus. No axillary adenopathy. Mildly enlarged 1.2 cm subcarinal node (series 2/image 73), unchanged since 05/06/2017 chest CT. No new pathologically enlarged mediastinal nodes. No discrete hilar adenopathy on this noncontrast scan. Lungs/Pleura: No pneumothorax. Small dependent bilateral pleural effusions. No acute consolidative airspace disease, lung masses or significant pulmonary nodules. Scattered small calcified granulomas throughout both lungs. No significant air trapping on the expiration sequence. There is patchy ground-glass opacity and interlobular septal thickening throughout both lungs, most prominent in the dependent lower lungs. There is mild patchy subpleural reticulation with minimal traction bronchiolectasis in both lungs with a mild basilar predominance. No frank honeycombing. Upper abdomen: Small hiatal hernia. Hypodense 3.0 cm right liver dome mass (series 2/image 103), unchanged since 03/29/2014 CT, where it was seen to represent a hemangioma. Musculoskeletal: No aggressive appearing focal osseous lesions. Mild  thoracic spondylosis. IMPRESSION: 1. Aortic valvular calcification, which can be correlated with aortic valvular stenosis. Consider echocardiographic correlation. Top-normal heart size. 2. Small dependent bilateral pleural effusions. 3. Patchy ground-glass opacity and interlobular septal thickening, most prominent in the dependent lower lungs. Mild patchy subpleural reticulation with minimal traction bronchiolectasis, with a mild basilar predominance. No honeycombing. 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 A follow-up high-resolution chest CT study could be obtained in 12 months to assess temporal pattern stability, as clinically warranted. 4. Three-vessel coronary atherosclerosis. 5. Small hiatal hernia. Aortic Atherosclerosis (ICD10-I70.0). Electronically Signed   By: Ilona Sorrel M.D.   On: 12/11/2018 20:26   Nm Pulmonary Perf And Vent  Result Date: 12/11/2018 CLINICAL DATA:  Acute onset of shortness of breath. Hemoptysis. Chronic renal failure precludes IV contrast administration. EXAM: NUCLEAR MEDICINE VENTILATION - PERFUSION LUNG SCAN TECHNIQUE: Ventilation images were obtained in multiple projections using inhaled aerosol Tc-54m DTPA. Perfusion images were obtained in multiple projections after intravenous injection of Tc-33m MAA. RADIOPHARMACEUTICALS:  33.3 mCi of Tc-17m DTPA aerosol inhalation and 4.3 mCi Tc30m MAA IV COMPARISON:  No prior nuclear imaging. Chest x-rays 12/10/2018, 12/05/2018 and earlier. FINDINGS: Ventilation: No focal ventilatory defect. Ingested aerosol in the esophagus and stomach. Perfusion: Normal perfusion. No wedge-shaped perfusion defects to indicate pulmonary embolism. IMPRESSION: Normal examination. Electronically Signed   By: Evangeline Dakin M.D.   On: 12/11/2018 13:05    Dg Chest Port 1 View  Result Date: 12/10/2018 CLINICAL DATA:  Patient with shortness of breath. EXAM: PORTABLE CHEST 1 VIEW COMPARISON:  Chest radiograph 12/05/2018 FINDINGS: Monitoring leads overlie the patient. Stable cardiomegaly. Interval development of bilateral interstitial pulmonary opacities. No large pleural effusion or pneumothorax. IMPRESSION: Cardiomegaly and interstitial opacities favored to represent edema. Electronically Signed   By: Lovey Newcomer M.D.   On: 12/10/2018 15:32     Medications:    . allopurinol  200 mg Oral BID  . amLODipine  5 mg Oral BID  . citalopram  10 mg Oral Daily  . docusate sodium  100 mg Oral BID  . fluticasone  2 spray Each Nare Daily  . furosemide  20 mg Oral Daily  . insulin aspart  0-9 Units Subcutaneous TID WC  . insulin glargine  50 Units  Subcutaneous QHS  . isosorbide mononitrate  60 mg Oral BID  . losartan  25 mg Oral Daily  . magnesium gluconate  500 mg Oral BID  . methocarbamol  500 mg Oral BID  . metoprolol tartrate  75 mg Oral BID  . mometasone-formoterol  2 puff Inhalation BID  . pantoprazole  40 mg Oral BID  . ranolazine  500 mg Oral BID  . rosuvastatin  40 mg Oral QPM  . sodium chloride flush  10 mL Intravenous Q12H   acetaminophen **OR** acetaminophen, alum & mag hydroxide-simeth, bisacodyl, chlorpheniramine-HYDROcodone, nitroGLYCERIN, ondansetron **OR** ondansetron (ZOFRAN) IV, sodium phosphate, traZODone  Assessment/ Plan:  [name] is a 78 y.o. white male with prostate cancer, diabetes mellitus type 2, diverticulosis, gout, coronary artery disease, hyperlipidemia, hypertension, peripheral vascular disease, skin cancer, obstructive sleep apnea was admitted with unstable angina.    1. 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 History of hyperkalemia GFR appears to be at its baseline.   - Holding losartan    2.  Hypertension.  Blood pressure at goal.   - furosemide,  amlodipine, metoprolol. Monitor potassium   3.  Anemia chronic kidney disease.  Status post PRBC transfusion.  - IV iron.  - Follow up with hematology - Consider Epogen as an outpatient.  4.  Gout.   Continue allopurinol 200 mg p.o. twice daily   5. Coronary artery disease - medical management    LOS: 7 Neila Teem 1/3/202011:26 AM

## 2018-12-12 NOTE — Plan of Care (Signed)
  Problem: Education: Goal: Knowledge of General Education information will improve Description: Including pain rating scale, medication(s)/side effects and non-pharmacologic comfort measures Outcome: Progressing   Problem: Health Behavior/Discharge Planning: Goal: Ability to manage health-related needs will improve Outcome: Progressing   Problem: Safety: Goal: Ability to remain free from injury will improve Outcome: Progressing   

## 2018-12-12 NOTE — Care Management Important Message (Signed)
Copy of signed Medicare IM left with patient in room. 

## 2018-12-12 NOTE — Progress Notes (Addendum)
Lab called for a critical troponin of 0.04. Page prime. Will continue to monitor.  Update 0045. Dr. Jerelyn Charles ordered troponin check every 3 hours times two. Will continue to monitor.

## 2018-12-12 NOTE — Consult Note (Signed)
Mike Lame, MD [facility]  [address] [place], [place] [address] Phone: [phone] Fax : [phone]  Consultation  Referring Provider:     Dr. Bridgett Larsson Primary Care Physician:  Cletis [place], MD Primary Gastroenterologist:  Dr. Vira Agar         Reason for Consultation:     Anemia  Date of Admission:  12/05/2018 Date of Consultation:  12/12/2018         HPI:   Mike Decker is a 78 y.o. male With a significant history of cardiac disease with a non-STEMI, hyperlipidemia, diabetes, hypertension and a history of Barrett's esophagus with ablation at [facility].  The patient was admitted with chest pain and was seen by cardiology.  It was reported of the patient's chest pain went away with nitroglycerin.  The patient also had a cardiac catheterization this admission.  His last dose of Plavix was on the 28th of this December.  He also reports that he had a colonoscopy in 2014 and was told that he didn't need another colonoscopy for 10 years.  His last EGD was in June 2019 and he was told that he doesn't need another one until June 2020. The patient's hemoglobin in 2018 was around 10.1 - 11.9. At the beginning of December of this year the patient's hemoglobin was 9.3. On December 27 it was as high as 9.4 but went down to 7.5 on December 08, 2018.  The patient reports that he has received 1 unit of blood and has ranged around a hemoglobin of 8.  The patient denies any black stools or bloody stools. The patient has been evaluated by both pulmonology and cardiology.  The cardiology progress note from today reports the patient to have multivessel disease with some significant stenosis but had cleared him for endoscopic evaluation for his anemia.  Past Medical History:  Diagnosis Date  . Arthritis   . Atrioventricular canal (AVC)    irregular heart beats  . Barrett esophagus   . Cancer [facility]) 2002   prostate  . Chronic diastolic CHF (congestive heart failure) ([place])   . Colon  polyp   . Diabetes mellitus without complication ([place])   . Diverticulosis   . Gout   . Heart disease   . Hemangioma    liver  . Hyperlipidemia   . Hypertension   . Myocardial infarct ([place])   . Ocular hypertension   . Peripheral vascular disease ([place])   . Skin cancer   . Sleep apnea   . Vitreoretinal degeneration     Past Surgical History:  Procedure Laterality Date  . CATARACT EXTRACTION  2011, 2012  . CORONARY ANGIOPLASTY WITH STENT PLACEMENT  2012  . ESOPHAGOGASTRODUODENOSCOPY (EGD) WITH PROPOFOL N/A 06/01/2016   Procedure: ESOPHAGOGASTRODUODENOSCOPY (EGD) WITH PROPOFOL;  Surgeon: Manya Silvas, MD;  Location: [facility] ENDOSCOPY;  Service: Endoscopy;  Laterality: N/A;  . HERNIA REPAIR     x2  . INTRAOCULAR LENS INSERTION    . LEFT HEART CATH AND CORONARY ANGIOGRAPHY N/A 05/09/2017   Procedure: Left Heart Cath and Coronary Angiography;  Surgeon: Yolonda Kida, MD;  Location: [facility] CV LAB;  Service: Cardiovascular;  Laterality: N/A;  . LEFT HEART CATH AND CORONARY ANGIOGRAPHY N/A 12/08/2018   Procedure: LEFT HEART CATH AND CORONARY ANGIOGRAPHY and possible PCI and stent;  Surgeon: Yolonda Kida, MD;  Location: [facility] CV LAB;  Service: Cardiovascular;  Laterality: N/A;  . NOSE SURGERY     submucous resection  .  PROSTATE SURGERY  2002  . ROTATOR CUFF REPAIR Right     Prior to Admission medications   Medication Sig Start Date End Date Taking? Authorizing Provider  acetaminophen (TYLENOL) 500 MG tablet Take 1,000 mg by mouth every 6 (six) hours as needed for mild pain, moderate pain or headache.   Yes [provider]  allopurinol (ZYLOPRIM) 100 MG tablet Take 200 mg by mouth 2 (two) times daily.    Yes [provider]  amLODipine (NORVASC) 5 MG tablet Take 5 mg by mouth 2 (two) times daily.  04/02/17  Yes [provider]  aspirin (ASPIRIN CHILDRENS) 81 MG chewable tablet Chew 1 tablet (81 mg total) by mouth daily. 10/09/15  Yes  Mody, Ulice Bold, MD  budesonide-formoterol (SYMBICORT) 160-4.5 MCG/ACT inhaler Inhale 2 puffs into the lungs 2 (two) times daily.   Yes [provider]  cetirizine (ZYRTEC) 5 MG tablet Take 5 mg by mouth daily as needed for allergies or rhinitis.    Yes [provider]  citalopram (CELEXA) 10 MG tablet Take 10 mg by mouth daily.   Yes [provider]  clopidogrel (PLAVIX) 75 MG tablet Take 1 tablet (75 mg total) by mouth daily. 10/09/15  Yes Mody, Ulice Bold, MD  colchicine 0.6 MG tablet Take 1 tablet (0.6 mg total) by mouth daily. Patient taking differently: Take 1.8 mg by mouth daily as needed (gout flares).  11/17/18  Yes Mody, Ulice Bold, MD  dextromethorphan-guaiFENesin (MUCINEX DM) 30-600 MG 12hr tablet Take 1 tablet by mouth 2 (two) times daily as needed for cough.   Yes [provider]  EPINEPHrine (EPI-PEN) 0.3 mg/0.3 mL SOAJ injection Inject into the muscle as needed (anaphylaxis).    Yes [provider]  fluticasone (FLONASE) 50 MCG/ACT nasal spray Place 2 sprays into both nostrils daily.  11/04/18  Yes [provider]  furosemide (LASIX) 20 MG tablet Take 2 tablets (40 mg total) by mouth daily. 11/17/18  Yes Mody, Ulice Bold, MD  insulin aspart (NOVOLOG FLEXPEN) 100 UNIT/ML FlexPen Inject 14-24 Units into the skin See admin instructions. Inject 14u under the skin at breakfast, 24u at lunch and 24u under the skin at dinnertime according to sliding scale   Yes [provider]  insulin glargine (LANTUS) 100 UNIT/ML injection Inject 62 Units into the skin at bedtime.    Yes [provider]  isosorbide mononitrate (IMDUR) 120 MG 24 hr tablet Take 1 tablet (120 mg total) by mouth daily. 10/09/15  Yes Mody, Ulice Bold, MD  losartan (COZAAR) 25 MG tablet Take 25 mg by mouth daily. 10/23/18  Yes [provider]  magnesium gluconate (MAGONATE) 500 MG tablet Take 500 mg by mouth 2 (two) times daily.    Yes [provider]  methocarbamol  (ROBAXIN) 500 MG tablet Take 500 mg by mouth 2 (two) times daily.  11/08/18  Yes [provider]  metoprolol tartrate (LOPRESSOR) 50 MG tablet Take 75 mg by mouth 2 (two) times daily.    Yes [provider]  nitroGLYCERIN (NITROSTAT) 0.4 MG SL tablet Place 0.4 mg under the tongue every 5 (five) minutes x 3 doses as needed for chest pain.    Yes [provider]  Omega-3 Fatty Acids (FISH OIL) 1000 MG CAPS Take 1 capsule by mouth daily.    Yes [provider]  pantoprazole (PROTONIX) 40 MG tablet Take 40 mg by mouth 2 (two) times daily.   Yes [provider]  rosuvastatin (CRESTOR) 40 MG tablet Take 40 mg by  mouth every evening.    Yes [provider]  traMADol (ULTRAM) 50 MG tablet Take 50 mg by mouth every 4 (four) hours as needed for moderate pain.    Yes [provider]  Vitamin D, Ergocalciferol, (DRISDOL) 50000 units CAPS capsule Take 50,000 Units by mouth every 30 (thirty) days.    Yes [provider]  ipratropium-albuterol (DUONEB) 0.5-2.5 (3) MG/3ML SOLN Take 3 mLs by nebulization every 4 (four) hours as needed. Patient not taking: Reported on 11/14/2018 05/15/17   Hillary Bow, MD  Metoprolol Tartrate 75 MG TABS Take 50 mg by mouth 2 (two) times daily. 11/17/18   Bettey Costa, MD    Family History  Problem Relation Age of Onset  . Stroke Maternal Grandfather      Social History   Tobacco Use  . Smoking status: Former Smoker    Years: 20.00    Types: Cigarettes    Last attempt to quit: 12/10/1976    Years since quitting: 42.0  . Smokeless tobacco: Never Used  Substance Use Topics  . Alcohol use: No  . Drug use: No    Allergies as of 12/05/2018 - Review Complete 12/05/2018  Allergen Reaction Noted  . Gabapentin  08/29/2017  . Peanut-containing drug products Anaphylaxis 03/30/2014  . Penicillins Hives and Rash 03/30/2014  . Bee venom Swelling 03/30/2014  . Influenza vaccines Hives 03/30/2014  . Inh  [isoniazid] Hives 03/30/2014  . Kenalog [triamcinolone acetonide] Hives 03/30/2014  . Levaquin [levofloxacin] Other (See Comments) 10/20/2018  . Nalfon [fenoprofen calcium] Hives 03/30/2014  . Naproxen  02/27/2018  . Peanut oil  02/27/2018  . Nsaids Rash     Review of Systems:    All systems reviewed and negative except where noted in HPI.   Physical Exam:  Vital signs in last 24 hours: Temp:  [98 F (36.7 C)-98.5 F (36.9 C)] 98.4 F (36.9 C) (01/03 1646) Pulse Rate:  [50-65] 58 (01/03 1646) Resp:  [18-20] 18 (01/03 1646) BP: (100-143)/(39-61) 118/48 (01/03 1646) SpO2:  [92 %-100 %] 94 % (01/03 1646) Weight:  [87.7 kg] 87.7 kg (01/03 0325) Last BM Date: 12/10/18 General:   Pleasant, cooperative in NAD Head:  Normocephalic and atraumatic. Eyes:   No icterus.   Conjunctiva pink. PERRLA. Ears:  Normal auditory acuity. Neck:  Supple; no masses or thyroidomegaly Lungs: Respirations even and unlabored. Lungs clear to auscultation bilaterally.   No wheezes, crackles, or rhonchi.  Heart:  Regular rate and rhythm;  Positive murmur, clicks, rubs or gallops Abdomen:  Soft, nondistended, nontender. Normal bowel sounds. No appreciable masses or hepatomegaly.  No rebound or guarding.  Rectal:  Not performed. Msk:  Symmetrical without gross deformities.    Extremities:  Without edema, cyanosis or clubbing. Neurologic:  Alert and oriented x3;  grossly normal neurologically. Skin:  Intact without significant lesions or rashes. Cervical Nodes:  No significant cervical adenopathy. Psych:  Alert and cooperative. Normal affect.  LAB RESULTS: Recent Labs    12/10/18 0414 12/11/18 0436 12/12/18 0240  HGB 8.7* 8.3* 8.0*   BMET Recent Labs    12/11/18 0435 12/12/18 0240  NA 135 135  K 4.3 4.1  CL 108 106  CO2 19* 21*  GLUCOSE 64* 76  BUN 44* 50*  CREATININE 2.40* 2.66*  CALCIUM 8.5* 8.3*   LFT No results for input(s): PROT, ALBUMIN, AST, ALT, ALKPHOS, BILITOT, BILIDIR, IBILI in  the last 72 hours. PT/INR No results for input(s): LABPROT, INR in the last 72 hours.  STUDIES:  Ct Chest High Resolution  Result Date: 12/11/2018 CLINICAL DATA:  Inpatient. Hemoptysis. Dyspnea. Generalized weakness. Hypoxia. EXAM: CT CHEST WITHOUT CONTRAST TECHNIQUE: Multidetector CT imaging of the chest was performed following the standard protocol without intravenous contrast. High resolution imaging of the lungs, as well as inspiratory and expiratory imaging, was performed. COMPARISON:  05/06/2017 chest CT angiogram. Chest radiograph from one day prior. FINDINGS: Cardiovascular: Top-normal heart size. No significant pericardial effusion/thickening. Three-vessel coronary atherosclerosis. Aortic valvular calcification. Atherosclerotic nonaneurysmal thoracic aorta. Normal caliber pulmonary arteries. Mediastinum/Nodes: No discrete thyroid nodules. Unremarkable esophagus. No axillary adenopathy. Mildly enlarged 1.2 cm subcarinal node (series 2/image 73), unchanged since 05/06/2017 chest CT. No new pathologically enlarged mediastinal nodes. No discrete hilar adenopathy on this noncontrast scan. Lungs/Pleura: No pneumothorax. Small dependent bilateral pleural effusions. No acute consolidative airspace disease, lung masses or significant pulmonary nodules. Scattered small calcified granulomas throughout both lungs. No significant air trapping on the expiration sequence. There is patchy ground-glass opacity and interlobular septal thickening throughout both lungs, most prominent in the dependent lower lungs. There is mild patchy subpleural reticulation with minimal traction bronchiolectasis in both lungs with a mild basilar predominance. No frank honeycombing. Upper abdomen: Small hiatal hernia. Hypodense 3.0 cm right liver dome mass (series 2/image 103), unchanged since 03/29/2014 CT, where it was seen to represent a hemangioma. Musculoskeletal: No aggressive appearing focal osseous lesions. Mild thoracic  spondylosis. IMPRESSION: 1. Aortic valvular calcification, which can be correlated with aortic valvular stenosis. Consider echocardiographic correlation. Top-normal heart size. 2. Small dependent bilateral pleural effusions. 3. Patchy ground-glass opacity and interlobular septal thickening, most prominent in the dependent lower lungs. Mild patchy subpleural reticulation with minimal traction bronchiolectasis, with a mild basilar predominance. No honeycombing. 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 A follow-up high-resolution chest CT study could be obtained in 12 months to assess temporal pattern stability, as clinically warranted. 4. Three-vessel coronary atherosclerosis. 5. Small hiatal hernia. Aortic Atherosclerosis (ICD10-I70.0). Electronically Signed   By: Ilona Sorrel M.D.   On: 12/11/2018 20:26   Nm Pulmonary Perf And Vent  Result Date: 12/11/2018 CLINICAL DATA:  Acute onset of shortness of breath. Hemoptysis. Chronic renal failure precludes IV contrast administration. EXAM: NUCLEAR MEDICINE VENTILATION - PERFUSION LUNG SCAN TECHNIQUE: Ventilation images were obtained in multiple projections using inhaled aerosol Tc-79m DTPA. Perfusion images were obtained in multiple projections after intravenous injection of Tc-71m MAA. RADIOPHARMACEUTICALS:  33.3 mCi of Tc-75m DTPA aerosol inhalation and 4.3 mCi Tc78m MAA IV COMPARISON:  No prior nuclear imaging. Chest x-rays 12/10/2018, 12/05/2018 and earlier. FINDINGS: Ventilation: No focal ventilatory defect. Ingested aerosol in the esophagus and stomach. Perfusion: Normal perfusion. No wedge-shaped perfusion defects to indicate pulmonary embolism. IMPRESSION: Normal examination. Electronically Signed   By: Evangeline Dakin M.D.   On: 12/11/2018 13:05       Impression / Plan:   Assessment: Active Problems:   NSTEMI (non-ST elevated myocardial infarction) (HCC)   Iron deficiency anemia due to chronic blood loss   Unstable angina (HCC)   Anemia of chronic renal failure, stage 4 (severe) (HCC)   SOB (shortness of breath)   Mike Decker is a 78 y.o. y/o male with a history of chronic anemia.  The patient has a history of Barrett's esophagus with ablation at [facility].  He also reports that he has a history of colon polyps.  We are now being asked to  see the patient for anemia and to rule out a GI bleed.  Cardiology has left a note today stating that the patient is medically optimized for endoscopy and/or colonoscopy.  Plan:  The patient has been told that he will be set up for an EGD and colonoscopy.  The patient had eaten a full dinner of solid food today when I came to see him.  The patient will be put on a clear liquid diet for tomorrow and then prep tomorrow for a EGD and colonoscopy for Sunday.  The patient has been explained the plan and agrees with it.  Thank you for involving me in the care of this patient.      LOS: 7 days   Mike Lame, MD  12/12/2018, 6:28 PM    Note: This dictation was prepared with Dragon dictation along with smaller phrase technology. Any transcriptional errors that result from this process are unintentional.

## 2018-12-13 LAB — BASIC METABOLIC PANEL
Anion gap: 7 (ref 5–15)
BUN: 52 mg/dL — ABNORMAL HIGH (ref 8–23)
CO2: 20 mmol/L — AB (ref 22–32)
Calcium: 8.1 mg/dL — ABNORMAL LOW (ref 8.9–10.3)
Chloride: 106 mmol/L (ref 98–111)
Creatinine, Ser: 2.61 mg/dL — ABNORMAL HIGH (ref 0.61–1.24)
GFR calc Af Amer: 26 mL/min — ABNORMAL LOW (ref >60)
GFR, EST NON AFRICAN AMERICAN: 23 mL/min — AB (ref >60)
Glucose, Bld: 139 mg/dL — ABNORMAL HIGH (ref 70–99)
Potassium: 4.5 mmol/L (ref 3.5–5.1)
Sodium: 133 mmol/L — ABNORMAL LOW (ref 135–145)

## 2018-12-13 LAB — TYPE AND SCREEN
ABO/RH(D): A POS
Antibody Screen: POSITIVE
Donor AG Type: NEGATIVE
Unit division: 0
Unit division: 0
Unit division: 0

## 2018-12-13 LAB — RHEUMATOID FACTOR: Rheumatoid fact SerPl-aCnc: <10 IU/mL (ref 0.0–13.9)

## 2018-12-13 LAB — HEMOGLOBIN: HEMOGLOBIN: 7.6 g/dL — AB (ref 13.0–17.0)

## 2018-12-13 LAB — BPAM RBC
Blood Product Expiration Date: 202001102359
Blood Product Expiration Date: 202001122359
Blood Product Expiration Date: 202001142359
ISSUE DATE / TIME: 201912312224
UNIT TYPE AND RH: 6200
UNIT TYPE AND RH: 6200
Unit Type and Rh: 6200

## 2018-12-13 LAB — GLUCOSE, CAPILLARY
GLUCOSE-CAPILLARY: 92 mg/dL (ref 70–99)
Glucose-Capillary: 129 mg/dL — ABNORMAL HIGH (ref 70–99)
Glucose-Capillary: 151 mg/dL — ABNORMAL HIGH (ref 70–99)
Glucose-Capillary: 183 mg/dL — ABNORMAL HIGH (ref 70–99)
Glucose-Capillary: 59 mg/dL — ABNORMAL LOW (ref 70–99)
Glucose-Capillary: 75 mg/dL (ref 70–99)
Glucose-Capillary: 97 mg/dL (ref 70–99)

## 2018-12-13 LAB — ANA W/REFLEX: Anti Nuclear Antibody(ANA): NEGATIVE

## 2018-12-13 LAB — PREPARE RBC (CROSSMATCH)

## 2018-12-13 LAB — OCCULT BLOOD X 1 CARD TO LAB, STOOL: Fecal Occult Bld: NEGATIVE

## 2018-12-13 MED ORDER — ISOSORBIDE MONONITRATE ER 30 MG PO TB24
30.0000 mg | ORAL_TABLET | Freq: Two times a day (BID) | ORAL | Status: DC
  Administered 2018-12-13 – 2018-12-15 (×3): 30 mg via ORAL
  Filled 2018-12-13 (×3): qty 1

## 2018-12-13 MED ORDER — DIPHENHYDRAMINE HCL 25 MG PO CAPS
25.0000 mg | ORAL_CAPSULE | Freq: Four times a day (QID) | ORAL | Status: DC | PRN
  Administered 2018-12-13: 25 mg via ORAL
  Filled 2018-12-13: qty 1

## 2018-12-13 MED ORDER — PANTOPRAZOLE SODIUM 40 MG IV SOLR
40.0000 mg | Freq: Two times a day (BID) | INTRAVENOUS | Status: DC
  Administered 2018-12-14: 40 mg via INTRAVENOUS
  Filled 2018-12-13: qty 40

## 2018-12-13 MED ORDER — METOPROLOL TARTRATE 50 MG PO TABS
50.0000 mg | ORAL_TABLET | Freq: Two times a day (BID) | ORAL | Status: DC
  Administered 2018-12-13 – 2018-12-15 (×4): 50 mg via ORAL
  Filled 2018-12-13 (×4): qty 1

## 2018-12-13 MED ORDER — SODIUM CHLORIDE 0.9% IV SOLUTION
Freq: Once | INTRAVENOUS | Status: AC
  Administered 2018-12-13: 20:00:00 via INTRAVENOUS

## 2018-12-13 MED ORDER — BISACODYL 5 MG PO TBEC
10.0000 mg | DELAYED_RELEASE_TABLET | Freq: Once | ORAL | Status: AC
  Administered 2018-12-13: 10 mg via ORAL
  Filled 2018-12-13: qty 2

## 2018-12-13 MED ORDER — FUROSEMIDE 10 MG/ML IJ SOLN: 20.0000 mg | Freq: Once | INTRAMUSCULAR | Status: AC

## 2018-12-13 NOTE — Progress Notes (Signed)
Mike Antigua, MD [address] [place], [place], [place], [address] [address], [place], [place], [place], [address] Phone: [phone]  Fax: [phone]   Subjective: Patient denies any abdominal pain, nausea vomiting, hematochezia or melena.  Tolerating diet.   Objective: Exam: Vital signs in last 24 hours: Vitals:   12/12/18 2003 12/13/18 0138 12/13/18 0406 12/13/18 0735  BP: (!) 144/68  (!) 105/45 115/86  Pulse: 62 64 (!) 49 (!) 57  Resp: 20 18 20 18   Temp: 98.4 F (36.9 C)  98.3 F (36.8 C)   TempSrc: Oral  Oral   SpO2: 99% 98% 97% 94%  Weight:   88 kg   Height:       Weight change: 0.272 kg  Intake/Output Summary (Last 24 hours) at 12/13/2018 0954 Last data filed at 12/13/2018 0900 Gross per 24 hour  Intake 0 ml  Output 2631 ml  Net -2631 ml    General: No acute distress, AAO x3 Abd: Soft, NT/ND, No HSM Skin: Warm, no rashes Neck: Supple, Trachea midline   Lab Results: Lab Results  Component Value Date   WBC 8.4 12/09/2018   HGB 7.6 (L) 12/13/2018   HCT 25.2 (L) 12/09/2018   MCV 82.1 12/09/2018   PLT 237 12/09/2018   Micro Results: No results found for this or any previous visit (from the past 240 hour(s)). Studies/Results: Ct Chest High Resolution  Result Date: 12/11/2018 CLINICAL DATA:  Inpatient. Hemoptysis. Dyspnea. Generalized weakness. Hypoxia. EXAM: CT CHEST WITHOUT CONTRAST TECHNIQUE: Multidetector CT imaging of the chest was performed following the standard protocol without intravenous contrast. High resolution imaging of the lungs, as well as inspiratory and expiratory imaging, was performed. COMPARISON:  05/06/2017 chest CT angiogram. Chest radiograph from one day prior. FINDINGS: Cardiovascular: Top-normal heart size. No significant pericardial effusion/thickening. Three-vessel coronary atherosclerosis. Aortic valvular calcification. Atherosclerotic nonaneurysmal thoracic aorta. Normal caliber pulmonary arteries. Mediastinum/Nodes:  No discrete thyroid nodules. Unremarkable esophagus. No axillary adenopathy. Mildly enlarged 1.2 cm subcarinal node (series 2/image 73), unchanged since 05/06/2017 chest CT. No new pathologically enlarged mediastinal nodes. No discrete hilar adenopathy on this noncontrast scan. Lungs/Pleura: No pneumothorax. Small dependent bilateral pleural effusions. No acute consolidative airspace disease, lung masses or significant pulmonary nodules. Scattered small calcified granulomas throughout both lungs. No significant air trapping on the expiration sequence. There is patchy ground-glass opacity and interlobular septal thickening throughout both lungs, most prominent in the dependent lower lungs. There is mild patchy subpleural reticulation with minimal traction bronchiolectasis in both lungs with a mild basilar predominance. No frank honeycombing. Upper abdomen: Small hiatal hernia. Hypodense 3.0 cm right liver dome mass (series 2/image 103), unchanged since 03/29/2014 CT, where it was seen to represent a hemangioma. Musculoskeletal: No aggressive appearing focal osseous lesions. Mild thoracic spondylosis. IMPRESSION: 1. Aortic valvular calcification, which can be correlated with aortic valvular stenosis. Consider echocardiographic correlation. Top-normal heart size. 2. Small dependent bilateral pleural effusions. 3. Patchy ground-glass opacity and interlobular septal thickening, most prominent in the dependent lower lungs. Mild patchy subpleural reticulation with minimal traction bronchiolectasis, with a mild basilar predominance. No honeycombing. 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 A follow-up high-resolution chest CT study could be obtained in 12 months to assess temporal pattern stability, as clinically warranted.  4. Three-vessel coronary atherosclerosis. 5. Small hiatal hernia. Aortic Atherosclerosis (ICD10-I70.0). Electronically Signed   By: Janina Mayo.D.  On: 12/11/2018 20:26   Nm Pulmonary Perf And Vent  Result Date: 12/11/2018 CLINICAL DATA:  Acute onset of shortness of breath. Hemoptysis. Chronic renal failure precludes IV contrast administration. EXAM: NUCLEAR MEDICINE VENTILATION - PERFUSION LUNG SCAN TECHNIQUE: Ventilation images were obtained in multiple projections using inhaled aerosol Tc-73m DTPA. Perfusion images were obtained in multiple projections after intravenous injection of Tc-2m MAA. RADIOPHARMACEUTICALS:  33.3 mCi of Tc-34m DTPA aerosol inhalation and 4.3 mCi Tc77m MAA IV COMPARISON:  No prior nuclear imaging. Chest x-rays 12/10/2018, 12/05/2018 and earlier. FINDINGS: Ventilation: No focal ventilatory defect. Ingested aerosol in the esophagus and stomach. Perfusion: Normal perfusion. No wedge-shaped perfusion defects to indicate pulmonary embolism. IMPRESSION: Normal examination. Electronically Signed   By: Evangeline Dakin M.D.   On: 12/11/2018 13:05   Medications:  Scheduled Meds: . sodium chloride   Intravenous Once  . allopurinol  200 mg Oral BID  . bisacodyl  10 mg Oral Once  . citalopram  10 mg Oral Daily  . docusate sodium  100 mg Oral BID  . fluticasone  2 spray Each Nare Daily  . furosemide  20 mg Oral Daily  . insulin aspart  0-9 Units Subcutaneous TID WC  . insulin glargine  35 Units Subcutaneous QHS  . isosorbide mononitrate  30 mg Oral BID  . magnesium gluconate  500 mg Oral BID  . methocarbamol  500 mg Oral BID  . metoprolol tartrate  50 mg Oral BID  . mometasone-formoterol  2 puff Inhalation BID  . pantoprazole  40 mg Oral BID  . polyethylene glycol-electrolytes  4,000 mL Oral Once  . ranolazine  500 mg Oral BID  . rosuvastatin  40 mg Oral QPM  . sodium chloride flush  10 mL Intravenous Q12H   Continuous Infusions: PRN Meds:.acetaminophen **OR**  acetaminophen, alum & mag hydroxide-simeth, bisacodyl, chlorpheniramine-HYDROcodone, guaiFENesin-dextromethorphan, nitroGLYCERIN, ondansetron **OR** ondansetron (ZOFRAN) IV, sodium phosphate, traZODone   Assessment: Anemia  Plan: As per Dr. Dr. Allen Norris cardiology has cleared the patient for the procedures, and he has scheduled the patient for EGD and colonoscopy tomorrow   Therefore, patient is on clear liquid diet today and will start the prep today for procedures tomorrow  Patient has been evaluated by cardiology, Dr. Saralyn Pilar and they state "Patient is medically optimized for endoscopy and/or colonoscopy."  He does not have any signs of active GI bleeding and is hemodynamically stable at this time.  His hemoglobin is 7.6 this morning.  I have changed his Protonix from oral to IV to treat any underlying esophagitis or peptic ulcer disease that may be causing his anemia.  If his EGD and colonoscopy are negative he may need small bowel capsule subsequently.  He is being followed by hematology and is receiving IV iron transfusion.  Continue serial CBCs and transfuse PRN Maintain 2 large-bore IV lines  Patient last colonoscopy was in 2016 and at that time 3, less than 1 cm polyps were removed, diverticulosis was reported.  Pathology showed tubular adenoma  Last EGD was in 2017 and showed Barrett's esophagus.  I have discussed alternative options, risks & benefits,  which include, but are not limited to, bleeding, infection, perforation,respiratory complication & drug reaction.  The patient agrees with this plan & written consent will be obtained.    Patient was advised extensively of the risks associated with the procedure, especially given his recent end STEMI and it was discussed that cardiology has evaluated the patient and and states that he is medically optimized for the procedures.  Patient realized understanding and would like to proceed with procedures as scheduled.   LOS: 8 days    Mike Antigua, MD 12/13/2018, 9:54 AM

## 2018-12-13 NOTE — Progress Notes (Signed)
[facility] NAME: Mike Decker    MR#:  [id number]  DATE OF BIRTH:  [birth year]  SUBJECTIVE:  Patient still has hemoptysis, better shortness of breath. on oxygen by nasal cannula 2 L. He is on CPAP at night.  Sitting on chair. REVIEW OF SYSTEMS:  CONSTITUTIONAL: No fever, no dizziness, has generalized weakness.  EYES: No blurred or double vision.  EARS, NOSE, AND THROAT: No tinnitus or ear pain.  RESPIRATORY:  has cough, hemoptysis and shortness of breath, no wheezing. CARDIOVASCULAR: No chest pain, orthopnea, edema.  GASTROINTESTINAL: No nausea, vomiting, diarrhea or abdominal pain.  GENITOURINARY: No dysuria, hematuria.  ENDOCRINE: No polyuria, nocturia,  HEMATOLOGY: No anemia, easy bruising or bleeding SKIN: No rash or lesion. MUSCULOSKELETAL: No joint pain or arthritis.  No back pain. NEUROLOGIC: No tingling, numbness, weakness.  PSYCHIATRY: No anxiety or depression.   ROS  DRUG ALLERGIES:   Allergies  Allergen Reactions  . Gabapentin     Other reaction(s): Other (See Comments) Tremors  . Peanut-Containing Drug Products Anaphylaxis  . Penicillins Hives and Rash    Has patient had a PCN reaction causing immediate rash, facial/tongue/throat swelling, SOB or lightheadedness with hypotension: Yes Has patient had a PCN reaction causing severe rash involving mucus membranes or skin necrosis: No Has patient had a PCN reaction that required hospitalization No Has patient had a PCN reaction occurring within the last 10 years: No If all of the above answers are "NO", then may proceed with Cephalosporin use.  . Bee Venom Swelling  . Influenza Vaccines Hives  . Inh [Isoniazid] Hives  . Kenalog [Triamcinolone Acetonide] Hives  . Levaquin [Levofloxacin] Other (See Comments)    Tendon, ligament pain.   . Nalfon [Fenoprofen Calcium] Hives  . Naproxen   . Peanut Oil   . Nsaids Rash    Nalfon 600 Nalfon 600    VITALS:  Blood  pressure 115/86, pulse (!) 57, temperature 98.3 F (36.8 C), temperature source Oral, resp. rate 18, height 5\' 6"  (1.676 m), weight 88 kg, SpO2 94 %.  PHYSICAL EXAMINATION:  GENERAL:  78 y.o.-year-old patient lying in the bed with no acute distress.  Obesity. EYES: Pupils equal, round, reactive to light and accommodation. No scleral icterus. Extraocular muscles intact.  HEENT: Head atraumatic, normocephalic. Oropharynx and nasopharynx clear.  NECK:  Supple, no jugular venous distention. No thyroid enlargement, no tenderness.  LUNGS: Normal breath sounds bilaterally, no wheezing, rhonchi or crepitation.  No basilar crackles.  No use of accessory muscles of respiration.  CARDIOVASCULAR: S1, S2 normal. No murmurs, rubs, or gallops.  ABDOMEN: Soft, nontender, nondistended. Bowel sounds present. No organomegaly or mass.  EXTREMITIES: No pedal edema, cyanosis, or clubbing.  NEUROLOGIC: Cranial nerves II through XII are intact. Muscle strength 5/5 in all extremities. Sensation intact. Gait not checked.  PSYCHIATRIC: The patient is alert and oriented x 3.  SKIN: No obvious rash, lesion, or ulcer.   Physical Exam LABORATORY PANEL:   CBC Recent Labs  Lab 12/09/18 0316  12/13/18 0419  WBC 8.4  --   --   HGB 7.6*   < > 7.6*  HCT 25.2*  --   --   PLT 237  --   --    < > = values in this interval not displayed.   ------------------------------------------------------------------------------------------------------------------  Chemistries  Recent Labs  Lab 12/11/18 0435  12/13/18 0419  NA 135   < > 133*  K 4.3   < >  4.5  CL 108   < > 106  CO2 19*   < > 20*  GLUCOSE 64*   < > 139*  BUN 44*   < > 52*  CREATININE 2.40*   < > 2.61*  CALCIUM 8.5*   < > 8.1*  MG 2.4  --   --    < > = values in this interval not displayed.   ------------------------------------------------------------------------------------------------------------------  Cardiac Enzymes Recent Labs  Lab 12/12/18 0240  12/12/18 0632  TROPONINI 0.03* 0.03*   ------------------------------------------------------------------------------------------------------------------  RADIOLOGY:  Ct Chest High Resolution  Result Date: 12/11/2018 CLINICAL DATA:  Inpatient. Hemoptysis. Dyspnea. Generalized weakness. Hypoxia. EXAM: CT CHEST WITHOUT CONTRAST TECHNIQUE: Multidetector CT imaging of the chest was performed following the standard protocol without intravenous contrast. High resolution imaging of the lungs, as well as inspiratory and expiratory imaging, was performed. COMPARISON:  05/06/2017 chest CT angiogram. Chest radiograph from one day prior. FINDINGS: Cardiovascular: Top-normal heart size. No significant pericardial effusion/thickening. Three-vessel coronary atherosclerosis. Aortic valvular calcification. Atherosclerotic nonaneurysmal thoracic aorta. Normal caliber pulmonary arteries. Mediastinum/Nodes: No discrete thyroid nodules. Unremarkable esophagus. No axillary adenopathy. Mildly enlarged 1.2 cm subcarinal node (series 2/image 73), unchanged since 05/06/2017 chest CT. No new pathologically enlarged mediastinal nodes. No discrete hilar adenopathy on this noncontrast scan. Lungs/Pleura: No pneumothorax. Small dependent bilateral pleural effusions. No acute consolidative airspace disease, lung masses or significant pulmonary nodules. Scattered small calcified granulomas throughout both lungs. No significant air trapping on the expiration sequence. There is patchy ground-glass opacity and interlobular septal thickening throughout both lungs, most prominent in the dependent lower lungs. There is mild patchy subpleural reticulation with minimal traction bronchiolectasis in both lungs with a mild basilar predominance. No frank honeycombing. Upper abdomen: Small hiatal hernia. Hypodense 3.0 cm right liver dome mass (series 2/image 103), unchanged since 03/29/2014 CT, where it was seen to represent a hemangioma.  Musculoskeletal: No aggressive appearing focal osseous lesions. Mild thoracic spondylosis. IMPRESSION: 1. Aortic valvular calcification, which can be correlated with aortic valvular stenosis. Consider echocardiographic correlation. Top-normal heart size. 2. Small dependent bilateral pleural effusions. 3. Patchy ground-glass opacity and interlobular septal thickening, most prominent in the dependent lower lungs. Mild patchy subpleural reticulation with minimal traction bronchiolectasis, with a mild basilar predominance. No honeycombing. 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 A follow-up high-resolution chest CT study could be obtained in 12 months to assess temporal pattern stability, as clinically warranted. 4. Three-vessel coronary atherosclerosis. 5. Small hiatal hernia. Aortic Atherosclerosis (ICD10-I70.0). Electronically Signed   By: Ilona Sorrel M.D.   On: 12/11/2018 20:26    ASSESSMENT AND PLAN:  *Acute unstable angina  Resolved on current regiment. Noted abnormal stress test on Thursday He was on heparin drip. Continue aspirin, Plavix, nitrates as needed, Ranexa, beta-blocker therapy, statin therapy, IV morphine for breakthrough pain, supplemental oxygen. Cardiac catheterization:  Successful cardiac cath with mild to moderate diffuse coronary disease throughout No significant high-grade obstructive coronary disease Patient appears to have possible microvascular disease LV gram was deferred because of renal insufficiency Continue aspirin and Plavix per Dr. Clayborn Bigness. Aspirin and Plavix is on hold due to hemoptysis and anemia.  *CKD stage III BL Cr around 2.1, follow-up BMP while on Lasix.  *Chronic diabetes mellitus type 2  Continue sliding scale insulin with Accu-Cheks per routine,  levemir, cardiac/carbohydrate consistent diet  *Anemia of chronic disease.  Iron deficiency.  Per Dr. Tasia Catchings, IV rion x 3.  Follow-up her and [facility] physician as outpatient.  Hemoglobin down to 7.6, since the patient has symptomatic anemia and acute unstable angina, given 1 unit PRBC transfusion. Hb up to 8.7. follow-up hemoglobin and stool occult.  No active bleeding. Hemoglobin down to 7.6.  PRBC transfusion 1 unit and follow-up hemoglobin in the a.m. Per Dr. Allen Norris, EGD and colonoscopy tomorrow.  *Chronic obstructive sleep apnea CPAP at at bedtime/as needed  Hypertension.  Continue Lasix, decrease dose of Lopressor and Imdur, but hold Norvasc, losartan.  Obesity.  Diet control and follow-up with PCP.  Generalized weakness.  The patient needs home health and PT.  Acute respiratory failure with hypoxia and hemoptysis.  D-dimer is elevated. 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 VQ scan: Normal examination.  Pulmonary consult from Dr. Humphrey Rolls. Aspirin and Plavix on hold due to hemoptysis. Per Dr. Humphrey Rolls, Hemoptysis multifactorial probably related to anticoagulation which was started with aspirin and Plavix also possibility that is related to underlying pulmonary edema.  Try to wean off oxygen.  But the patient may need home oxygen.  Continue nebulizer and Lasix.  Acute on chronic diastolic CHF, recent echo showed ejection fraction 60%. Continue p.o. Lasix 20 mg daily.  Generalized weakness.  He has recurrent admissions and need home health.  Discussed with Dr. Juleen China. All the records are reviewed and case discussed with Care Management/Social Workerr. Management plans discussed with the patient, his wife and they are in agreement.  CODE STATUS: full  TOTAL TIME TAKING CARE OF THIS PATIENT: 28 minutes.  POSSIBLE D/C IN 2 DAYS, DEPENDING ON CLINICAL CONDITION.   Demetrios Loll M.D on 12/13/2018   Between 7am to  6pm - Pager - [phone]  After 6pm go to [email or web address] - password [email or web address] [facility]  Office  [phone]  CC: Primary care physician; Cletis Athens, MD  Note: This dictation was prepared with Dragon dictation along with smaller phrase technology. Any transcriptional errors that result from this process are unintentional.

## 2018-12-13 NOTE — Progress Notes (Signed)
[facility]  ROUNDING NOTE   Subjective:   Patient continues to complain of shortness of breath, cough and hemoptysis.   Endoscopy for tomorrow.   PRBC transfusion for later today.   Objective:  Vital signs in last 24 hours:  Temp:  [98.3 F (36.8 C)-98.4 F (36.9 C)] 98.3 F (36.8 C) (01/04 0406) Pulse Rate:  [49-64] 57 (01/04 0735) Resp:  [18-20] 18 (01/04 0735) BP: (105-144)/(45-86) 115/86 (01/04 0735) SpO2:  [94 %-99 %] 94 % (01/04 0735) Weight:  [88 kg] 88 kg (01/04 0406)  Weight change: 0.272 kg Filed Weights   12/11/18 0559 12/12/18 0325 12/13/18 0406  Weight: 90.8 kg 87.7 kg 88 kg    Intake/Output: I/O last 3 completed shifts: In: 0  Out: 3231 [Urine:3230; Stool:1]   Intake/Output this shift:  Total I/O In: 600 [P.O.:600] Out: 600 [Urine:600]  Physical Exam: General: No acute distress  Head: Normocephalic, atraumatic. Moist oral mucosal membranes  Eyes: Anicteric  Neck: Supple, trachea midline  Lungs:  Clear to auscultation, normal effort  Heart: S1S2 no rubs  Abdomen:  Soft, nontender, bowel sounds present  Extremities: No peripheral edema.  Neurologic: Awake, alert, following commands  Skin: No lesions        Basic Metabolic Panel: Recent Labs  Lab 12/09/18 0316 12/11/18 0435 12/12/18 0240 12/13/18 0419  NA 136 135 135 133*  K 4.6 4.3 4.1 4.5  CL 110 108 106 106  CO2 20* 19* 21* 20*  GLUCOSE 151* 64* 76 139*  BUN 39* 44* 50* 52*  CREATININE 2.17* 2.40* 2.66* 2.61*  CALCIUM 8.2* 8.5* 8.3* 8.1*  MG  --  2.4  --   --     Liver Function Tests: No results for input(s): AST, ALT, ALKPHOS, BILITOT, PROT, ALBUMIN in the last 168 hours. No results for input(s): LIPASE, AMYLASE in the last 168 hours. No results for input(s): AMMONIA in the last 168 hours.  CBC: Recent Labs  Lab 12/07/18 0447 12/08/18 0328  12/09/18 0316 12/10/18 0414 12/11/18 0436 12/12/18 0240 12/13/18 0419  WBC 7.6 8.4  --  8.4  --   --   --   --    HGB 8.7* 7.5*   < > 7.6* 8.7* 8.3* 8.0* 7.6*  HCT 28.8* 25.4*  --  25.2*  --   --   --   --   MCV 81.8 82.5  --  82.1  --   --   --   --   PLT 285 247  --  237  --   --   --   --    < > = values in this interval not displayed.    Cardiac Enzymes: Recent Labs  Lab 12/11/18 2353 12/12/18 0240 12/12/18 0632  TROPONINI 0.04* 0.03* 0.03*    BNP: Invalid input(s): POCBNP  CBG: Recent Labs  Lab 12/13/18 0036 12/13/18 0405 12/13/18 0733 12/13/18 1137 12/13/18 1244  GLUCAP 183* 151* 75 59* 129*    Microbiology: Results for orders placed or performed during the hospital encounter of 05/06/17  Blood Culture (routine x 2)     Status: None   Collection Time: 05/06/17  7:02 PM  Result Value Ref Range Status   Specimen Description BLOOD RIGHT HAND  Final   Special Requests   Final    BOTTLES DRAWN AEROBIC AND ANAEROBIC Blood Culture adequate volume   Culture NO GROWTH 5 DAYS  Final   Report Status 05/11/2017 FINAL  Final  Blood Culture (routine x 2)  Status: None   Collection Time: 05/06/17  7:02 PM  Result Value Ref Range Status   Specimen Description BLOOD LEFT AC  Final   Special Requests   Final    BOTTLES DRAWN AEROBIC AND ANAEROBIC Blood Culture adequate volume   Culture NO GROWTH 5 DAYS  Final   Report Status 05/11/2017 FINAL  Final  Urine culture     Status: None   Collection Time: 05/06/17  7:18 PM  Result Value Ref Range Status   Specimen Description URINE, RANDOM  Final   Special Requests NONE  Final   Culture   Final    NO GROWTH Performed at [facility], [place] [address], [place], [place] [address]    Report Status 05/08/2017 FINAL  Final  Gastrointestinal Panel by PCR , Stool     Status: Abnormal   Collection Time: 05/07/17  4:44 AM  Result Value Ref Range Status   Campylobacter species NOT DETECTED NOT DETECTED Final   Plesimonas shigelloides NOT DETECTED NOT DETECTED Final   Salmonella species DETECTED (A) NOT DETECTED Final    Comment:  RESULT CALLED TO, READ BACK BY AND VERIFIED WITH: BETH BUONO 05/07/17 0642 ALV    Yersinia enterocolitica NOT DETECTED NOT DETECTED Final   Vibrio species NOT DETECTED NOT DETECTED Final   Vibrio cholerae NOT DETECTED NOT DETECTED Final   Enteroaggregative E coli (EAEC) NOT DETECTED NOT DETECTED Final   Enteropathogenic E coli (EPEC) NOT DETECTED NOT DETECTED Final   Enterotoxigenic E coli (ETEC) NOT DETECTED NOT DETECTED Final   Shiga like toxin producing E coli (STEC) NOT DETECTED NOT DETECTED Final   E. coli O157 NOT DETECTED NOT DETECTED Final   Shigella/Enteroinvasive E coli (EIEC) NOT DETECTED NOT DETECTED Final   Cryptosporidium NOT DETECTED NOT DETECTED Final   Cyclospora cayetanensis NOT DETECTED NOT DETECTED Final   Entamoeba histolytica NOT DETECTED NOT DETECTED Final   Giardia lamblia NOT DETECTED NOT DETECTED Final   Adenovirus F40/41 NOT DETECTED NOT DETECTED Final   Astrovirus NOT DETECTED NOT DETECTED Final   Norovirus GI/GII NOT DETECTED NOT DETECTED Final   Rotavirus A NOT DETECTED NOT DETECTED Final   Sapovirus (I, II, IV, and V) NOT DETECTED NOT DETECTED Final  C difficile quick scan w PCR reflex     Status: None   Collection Time: 05/07/17  4:44 AM  Result Value Ref Range Status   C Diff antigen NEGATIVE NEGATIVE Final   C Diff toxin NEGATIVE NEGATIVE Final   C Diff interpretation No C. difficile detected.  Final  MRSA PCR Screening     Status: None   Collection Time: 05/07/17  6:24 AM  Result Value Ref Range Status   MRSA by PCR NEGATIVE NEGATIVE Final    Comment:        The GeneXpert MRSA Assay (FDA approved for NASAL specimens only), is one component of a comprehensive MRSA colonization surveillance program. It is not intended to diagnose MRSA infection nor to guide or monitor treatment for MRSA infections.     Coagulation Studies: No results for input(s): LABPROT, INR in the last 72 hours.  Urinalysis: Recent Labs    12/11/18 1512   COLORURINE YELLOW*  LABSPEC 1.013  PHURINE 5.0  GLUCOSEU NEGATIVE  HGBUR NEGATIVE  BILIRUBINUR NEGATIVE  KETONESUR NEGATIVE  PROTEINUR NEGATIVE  NITRITE NEGATIVE  LEUKOCYTESUR NEGATIVE      Imaging: Ct Chest High Resolution  Result Date: 12/11/2018 CLINICAL DATA:  Inpatient. Hemoptysis. Dyspnea. Generalized weakness. Hypoxia. EXAM:  CT CHEST WITHOUT CONTRAST TECHNIQUE: Multidetector CT imaging of the chest was performed following the standard protocol without intravenous contrast. High resolution imaging of the lungs, as well as inspiratory and expiratory imaging, was performed. COMPARISON:  05/06/2017 chest CT angiogram. Chest radiograph from one day prior. FINDINGS: Cardiovascular: Top-normal heart size. No significant pericardial effusion/thickening. Three-vessel coronary atherosclerosis. Aortic valvular calcification. Atherosclerotic nonaneurysmal thoracic aorta. Normal caliber pulmonary arteries. Mediastinum/Nodes: No discrete thyroid nodules. Unremarkable esophagus. No axillary adenopathy. Mildly enlarged 1.2 cm subcarinal node (series 2/image 73), unchanged since 05/06/2017 chest CT. No new pathologically enlarged mediastinal nodes. No discrete hilar adenopathy on this noncontrast scan. Lungs/Pleura: No pneumothorax. Small dependent bilateral pleural effusions. No acute consolidative airspace disease, lung masses or significant pulmonary nodules. Scattered small calcified granulomas throughout both lungs. No significant air trapping on the expiration sequence. There is patchy ground-glass opacity and interlobular septal thickening throughout both lungs, most prominent in the dependent lower lungs. There is mild patchy subpleural reticulation with minimal traction bronchiolectasis in both lungs with a mild basilar predominance. No frank honeycombing. Upper abdomen: Small hiatal hernia. Hypodense 3.0 cm right liver dome mass (series 2/image 103), unchanged since 03/29/2014 CT, where it was seen  to represent a hemangioma. Musculoskeletal: No aggressive appearing focal osseous lesions. Mild thoracic spondylosis. IMPRESSION: 1. Aortic valvular calcification, which can be correlated with aortic valvular stenosis. Consider echocardiographic correlation. Top-normal heart size. 2. Small dependent bilateral pleural effusions. 3. Patchy ground-glass opacity and interlobular septal thickening, most prominent in the dependent lower lungs. Mild patchy subpleural reticulation with minimal traction bronchiolectasis, with a mild basilar predominance. No honeycombing. 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 A follow-up high-resolution chest CT study could be obtained in 12 months to assess temporal pattern stability, as clinically warranted. 4. Three-vessel coronary atherosclerosis. 5. Small hiatal hernia. Aortic Atherosclerosis (ICD10-I70.0). Electronically Signed   By: Ilona Sorrel M.D.   On: 12/11/2018 20:26   Nm Pulmonary Perf And Vent  Result Date: 12/11/2018 CLINICAL DATA:  Acute onset of shortness of breath. Hemoptysis. Chronic renal failure precludes IV contrast administration. EXAM: NUCLEAR MEDICINE VENTILATION - PERFUSION LUNG SCAN TECHNIQUE: Ventilation images were obtained in multiple projections using inhaled aerosol Tc-41m DTPA. Perfusion images were obtained in multiple projections after intravenous injection of Tc-79m MAA. RADIOPHARMACEUTICALS:  33.3 mCi of Tc-28m DTPA aerosol inhalation and 4.3 mCi Tc33m MAA IV COMPARISON:  No prior nuclear imaging. Chest x-rays 12/10/2018, 12/05/2018 and earlier. FINDINGS: Ventilation: No focal ventilatory defect. Ingested aerosol in the esophagus and stomach. Perfusion: Normal perfusion. No wedge-shaped perfusion defects to indicate pulmonary embolism. IMPRESSION:  Normal examination. Electronically Signed   By: Evangeline Dakin M.D.   On: 12/11/2018 13:05     Medications:    . sodium chloride   Intravenous Once  . allopurinol  200 mg Oral BID  . citalopram  10 mg Oral Daily  . docusate sodium  100 mg Oral BID  . fluticasone  2 spray Each Nare Daily  . furosemide  20 mg Oral Daily  . insulin aspart  0-9 Units Subcutaneous TID WC  . insulin glargine  35 Units Subcutaneous QHS  . isosorbide mononitrate  30 mg Oral BID  . magnesium gluconate  500 mg Oral BID  . methocarbamol  500 mg Oral BID  . metoprolol tartrate  50 mg Oral BID  . mometasone-formoterol  2 puff Inhalation BID  .  pantoprazole (PROTONIX) IV  40 mg Intravenous Q12H  . polyethylene glycol-electrolytes  4,000 mL Oral Once  . ranolazine  500 mg Oral BID  . rosuvastatin  40 mg Oral QPM  . sodium chloride flush  10 mL Intravenous Q12H   acetaminophen **OR** acetaminophen, alum & mag hydroxide-simeth, bisacodyl, chlorpheniramine-HYDROcodone, guaiFENesin-dextromethorphan, nitroGLYCERIN, ondansetron **OR** ondansetron (ZOFRAN) IV, sodium phosphate, traZODone  Assessment/ Plan:  Mike Decker is a 78 y.o. white male with prostate cancer, diabetes mellitus type 2, diverticulosis, gout, coronary artery disease, hyperlipidemia, hypertension, peripheral vascular disease, skin cancer, obstructive sleep apnea was admitted with unstable angina.    1. 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 History of hyperkalemia GFR appears to be at its baseline.   - Holding losartan    2.  Hypertension.  Blood pressure at goal.   - furosemide, amlodipine, metoprolol. Monitor potassium   3.  Anemia chronic kidney disease.  Status post PRBC transfusion.  - IV iron.  - GI for endoscopy tomorrow.    LOS: 8 Mike Decker 1/4/202012:55 PM

## 2018-12-13 NOTE — Progress Notes (Addendum)
Pt blood transfusion was started at 2045. Pt tolerating well. Will continue to monitor.  Update 0045: Pt blood bag was still half full and still have 219 ml remaining on the bag and was getting close the 4 hour marked. Talked to Dr. Jerelyn Charles and okay to transfuse the remaining 219 ml. RN place the order and called blood bank. Will transfuse when the blood is available. Will continue to monitor.  Update: at 2355 pt finished his prep for colonoscopy. Will continue to monitor.  Update 0137: Pt new blood (219 ml) started transfusing. Pt tolerating well. Will continue to monitor.   Update 0245: Pt blood sugar is at 121 as per tech (marcus). Glucometer not sinking yet. Will continue to monitor.   Update 0320: Pt still have brownish liquidly color stool. [facility] was notified. Will continue to monitor.  Update 0324: Talked to Dr. Aliene Altes and states will place the order. Will administer medicine when orders place. Will continue to monitor.  Update 0327: pt blood transfusion was finished at 0327. Pt received total of 219 ml. HGB/ HCT ordered after two hours of transfusion. Will continue to monitor.

## 2018-12-13 NOTE — Plan of Care (Signed)
  Problem: Health Behavior/Discharge Planning: Goal: Ability to manage health-related needs will improve Outcome: Progressing   Problem: Safety: Goal: Ability to remain free from injury will improve Outcome: Progressing   

## 2018-12-14 ENCOUNTER — Encounter: Payer: Self-pay | Admitting: Anesthesiology

## 2018-12-14 ENCOUNTER — Encounter
Admission: EM | Disposition: A | Discharge status: Home Health | Payer: Self-pay | Source: Home / Self Care | Attending: Internal Medicine

## 2018-12-14 ENCOUNTER — Inpatient Hospital Stay: Payer: Medicare Other | Admitting: Anesthesiology

## 2018-12-14 DIAGNOSIS — K2100 Gastro-esophageal reflux disease with esophagitis, without bleeding: Secondary | ICD-10-CM

## 2018-12-14 DIAGNOSIS — I890 Lymphedema, not elsewhere classified: Secondary | ICD-10-CM

## 2018-12-14 DIAGNOSIS — D125 Benign neoplasm of sigmoid colon: Secondary | ICD-10-CM

## 2018-12-14 DIAGNOSIS — K649 Unspecified hemorrhoids: Secondary | ICD-10-CM

## 2018-12-14 DIAGNOSIS — D122 Benign neoplasm of ascending colon: Secondary | ICD-10-CM

## 2018-12-14 DIAGNOSIS — K5730 Diverticulosis of large intestine without perforation or abscess without bleeding: Secondary | ICD-10-CM

## 2018-12-14 DIAGNOSIS — K635 Polyp of colon: Secondary | ICD-10-CM

## 2018-12-14 DIAGNOSIS — K31819 Angiodysplasia of stomach and duodenum without bleeding: Secondary | ICD-10-CM

## 2018-12-14 HISTORY — PX: COLONOSCOPY: SHX5424

## 2018-12-14 HISTORY — PX: ESOPHAGOGASTRODUODENOSCOPY: SHX5428

## 2018-12-14 LAB — HEMOGLOBIN AND HEMATOCRIT, BLOOD
HCT: 35.7 % — ABNORMAL LOW (ref 39.0–52.0)
Hemoglobin: 11.2 g/dL — ABNORMAL LOW (ref 13.0–17.0)

## 2018-12-14 LAB — BASIC METABOLIC PANEL
Anion gap: 9 (ref 5–15)
BUN: 36 mg/dL — ABNORMAL HIGH (ref 8–23)
CO2: 22 mmol/L (ref 22–32)
Calcium: 9 mg/dL (ref 8.9–10.3)
Chloride: 105 mmol/L (ref 98–111)
Creatinine, Ser: 1.92 mg/dL — ABNORMAL HIGH (ref 0.61–1.24)
GFR calc Af Amer: 38 mL/min — ABNORMAL LOW (ref >60)
GFR calc non Af Amer: 33 mL/min — ABNORMAL LOW (ref >60)
Glucose, Bld: 86 mg/dL (ref 70–99)
Potassium: 4.4 mmol/L (ref 3.5–5.1)
Sodium: 136 mmol/L (ref 135–145)

## 2018-12-14 LAB — PREPARE RBC (CROSSMATCH)

## 2018-12-14 LAB — GLUCOSE, CAPILLARY
GLUCOSE-CAPILLARY: 121 mg/dL — AB (ref 70–99)
Glucose-Capillary: 78 mg/dL (ref 70–99)
Glucose-Capillary: 114 mg/dL — ABNORMAL HIGH (ref 70–99)
Glucose-Capillary: 234 mg/dL — ABNORMAL HIGH (ref 70–99)
Glucose-Capillary: 214 mg/dL — ABNORMAL HIGH (ref 70–99)

## 2018-12-14 SURGERY — ESOPHAGOGASTRODUODENOSCOPY (EGD) WITH PROPOFOL
Anesthesia: General

## 2018-12-14 SURGERY — EGD (ESOPHAGOGASTRODUODENOSCOPY)
Anesthesia: General

## 2018-12-14 MED ORDER — GLYCOPYRROLATE 0.2 MG/ML IJ SOLN
INTRAMUSCULAR | Status: DC | PRN
  Administered 2018-12-14: 0.2 mg via INTRAVENOUS

## 2018-12-14 MED ORDER — PANTOPRAZOLE SODIUM 40 MG PO TBEC
40.0000 mg | DELAYED_RELEASE_TABLET | Freq: Every day | ORAL | Status: DC
  Administered 2018-12-15: 40 mg via ORAL
  Filled 2018-12-14 (×2): qty 1

## 2018-12-14 MED ORDER — SODIUM CHLORIDE 0.9% IV SOLUTION
Freq: Once | INTRAVENOUS | Status: AC
  Administered 2018-12-14: 02:00:00 via INTRAVENOUS

## 2018-12-14 MED ORDER — SORBITOL 70 % SOLN
960.0000 mL | TOPICAL_OIL | Freq: Once | ORAL | Status: AC
  Administered 2018-12-14: 960 mL via RECTAL
  Filled 2018-12-14: qty 473

## 2018-12-14 MED ORDER — LIDOCAINE 2% (20 MG/ML) 5 ML SYRINGE
INTRAMUSCULAR | Status: DC | PRN
  Administered 2018-12-14: 100 mg via INTRAVENOUS

## 2018-12-14 MED ORDER — SODIUM CHLORIDE 0.9 % IV SOLN
INTRAVENOUS | Status: DC
  Administered 2018-12-14: 1000 mL via INTRAVENOUS

## 2018-12-14 MED ORDER — LIDOCAINE HCL (PF) 2 % IJ SOLN
INTRAMUSCULAR | Status: AC
  Filled 2018-12-14: qty 10

## 2018-12-14 MED ORDER — PROPOFOL 500 MG/50ML IV EMUL
INTRAVENOUS | Status: AC
  Filled 2018-12-14: qty 50

## 2018-12-14 MED ORDER — PROPOFOL 10 MG/ML IV BOLUS
INTRAVENOUS | Status: DC | PRN
  Administered 2018-12-14: 70 mg via INTRAVENOUS

## 2018-12-14 MED ORDER — GLYCOPYRROLATE 0.2 MG/ML IJ SOLN
INTRAMUSCULAR | Status: AC
  Filled 2018-12-14: qty 1

## 2018-12-14 MED ORDER — POLYETHYLENE GLYCOL 3350 17 G PO PACK
17.0000 g | PACK | Freq: Once | ORAL | Status: AC
  Administered 2018-12-14: 17 g via ORAL
  Filled 2018-12-14: qty 1

## 2018-12-14 MED ORDER — PROPOFOL 500 MG/50ML IV EMUL
INTRAVENOUS | Status: DC | PRN
  Administered 2018-12-14: 200 ug/kg/min via INTRAVENOUS

## 2018-12-14 MED ORDER — EPHEDRINE SULFATE 50 MG/ML IJ SOLN
INTRAMUSCULAR | Status: DC | PRN
  Administered 2018-12-14: 10 mg via INTRAVENOUS
  Administered 2018-12-14: 25 mg via INTRAVENOUS

## 2018-12-14 NOTE — Progress Notes (Signed)
[facility]  ROUNDING NOTE   Subjective:   EGD and colonoscopy today. Duodenal lesion identified. Also polypectomy with colonscopy.  Patient with several visitors this afternoon.   Objective:  Vital signs in last 24 hours:  Temp:  [97.3 F (36.3 C)-98.6 F (37 C)] 98.2 F (36.8 C) (01/05 0907) Pulse Rate:  [55-66] 65 (01/05 0934) Resp:  [15-20] 15 (01/05 0934) BP: (118-161)/(42-64) 133/51 (01/05 0934) SpO2:  [96 %-100 %] 100 % (01/05 0934)  Weight change:  Filed Weights   12/11/18 0559 12/12/18 0325 12/13/18 0406  Weight: 90.8 kg 87.7 kg 88 kg    Intake/Output: I/O last 3 completed shifts: In: 3500 [P.O.:1080; Blood:390] Out: 9381 [Urine:3530; Stool:900]   Intake/Output this shift:  Total I/O In: 25 [I.V.:25] Out: 0   Physical Exam: General: No acute distress  Head: Normocephalic, atraumatic. Moist oral mucosal membranes  Eyes: Anicteric  Neck: Supple, trachea midline  Lungs:  Clear to auscultation, normal effort  Heart: S1S2 no rubs  Abdomen:  Soft, nontender, bowel sounds present  Extremities: No peripheral edema.  Neurologic: Awake, alert, following commands  Skin: No lesions        Basic Metabolic Panel: Recent Labs  Lab 12/09/18 0316 12/11/18 0435 12/12/18 0240 12/13/18 0419 12/14/18 0623  NA 136 135 135 133* 136  K 4.6 4.3 4.1 4.5 4.4  CL 110 108 106 106 105  CO2 20* 19* 21* 20* 22  GLUCOSE 151* 64* 76 139* 86  BUN 39* 44* 50* 52* 36*  CREATININE 2.17* 2.40* 2.66* 2.61* 1.92*  CALCIUM 8.2* 8.5* 8.3* 8.1* 9.0  MG  --  2.4  --   --   --     Liver Function Tests: No results for input(s): AST, ALT, ALKPHOS, BILITOT, PROT, ALBUMIN in the last 168 hours. No results for input(s): LIPASE, AMYLASE in the last 168 hours. No results for input(s): AMMONIA in the last 168 hours.  CBC: Recent Labs  Lab 12/08/18 0328  12/09/18 0316 12/10/18 0414 12/11/18 0436 12/12/18 0240 12/13/18 0419 12/14/18 0623  WBC 8.4  --  8.4  --   --    --   --   --   HGB 7.5*   < > 7.6* 8.7* 8.3* 8.0* 7.6* 11.2*  HCT 25.4*  --  25.2*  --   --   --   --  35.7*  MCV 82.5  --  82.1  --   --   --   --   --   PLT 247  --  237  --   --   --   --   --    < > = values in this interval not displayed.    Cardiac Enzymes: Recent Labs  Lab 12/11/18 2353 12/12/18 0240 12/12/18 0632  TROPONINI 0.04* 0.03* 0.03*    BNP: Invalid input(s): POCBNP  CBG: Recent Labs  Lab 12/13/18 1626 12/13/18 2158 12/14/18 0247 12/14/18 0722 12/14/18 1136  GLUCAP 92 97 121* 61 114*    Microbiology: Results for orders placed or performed during the hospital encounter of 05/06/17  Blood Culture (routine x 2)     Status: None   Collection Time: 05/06/17  7:02 PM  Result Value Ref Range Status   Specimen Description BLOOD RIGHT HAND  Final   Special Requests   Final    BOTTLES DRAWN AEROBIC AND ANAEROBIC Blood Culture adequate volume   Culture NO GROWTH 5 DAYS  Final   Report Status 05/11/2017 FINAL  Final  Blood Culture (routine x 2)     Status: None   Collection Time: 05/06/17  7:02 PM  Result Value Ref Range Status   Specimen Description BLOOD LEFT AC  Final   Special Requests   Final    BOTTLES DRAWN AEROBIC AND ANAEROBIC Blood Culture adequate volume   Culture NO GROWTH 5 DAYS  Final   Report Status 05/11/2017 FINAL  Final  Urine culture     Status: None   Collection Time: 05/06/17  7:18 PM  Result Value Ref Range Status   Specimen Description URINE, RANDOM  Final   Special Requests NONE  Final   Culture   Final    NO GROWTH Performed at [facility], [place] [address] [place], [place] [address]    Report Status 05/08/2017 FINAL  Final  Gastrointestinal Panel by PCR , Stool     Status: Abnormal   Collection Time: 05/07/17  4:44 AM  Result Value Ref Range Status   Campylobacter species NOT DETECTED NOT DETECTED Final   Plesimonas shigelloides NOT DETECTED NOT DETECTED Final   Salmonella species DETECTED (A) NOT DETECTED Final     Comment: RESULT CALLED TO, READ BACK BY AND VERIFIED WITH: BETH BUONO 05/07/17 0642 ALV    Yersinia enterocolitica NOT DETECTED NOT DETECTED Final   Vibrio species NOT DETECTED NOT DETECTED Final   Vibrio cholerae NOT DETECTED NOT DETECTED Final   Enteroaggregative E coli (EAEC) NOT DETECTED NOT DETECTED Final   Enteropathogenic E coli (EPEC) NOT DETECTED NOT DETECTED Final   Enterotoxigenic E coli (ETEC) NOT DETECTED NOT DETECTED Final   Shiga like toxin producing E coli (STEC) NOT DETECTED NOT DETECTED Final   E. coli O157 NOT DETECTED NOT DETECTED Final   Shigella/Enteroinvasive E coli (EIEC) NOT DETECTED NOT DETECTED Final   Cryptosporidium NOT DETECTED NOT DETECTED Final   Cyclospora cayetanensis NOT DETECTED NOT DETECTED Final   Entamoeba histolytica NOT DETECTED NOT DETECTED Final   Giardia lamblia NOT DETECTED NOT DETECTED Final   Adenovirus F40/41 NOT DETECTED NOT DETECTED Final   Astrovirus NOT DETECTED NOT DETECTED Final   Norovirus GI/GII NOT DETECTED NOT DETECTED Final   Rotavirus A NOT DETECTED NOT DETECTED Final   Sapovirus (I, II, IV, and V) NOT DETECTED NOT DETECTED Final  C difficile quick scan w PCR reflex     Status: None   Collection Time: 05/07/17  4:44 AM  Result Value Ref Range Status   C Diff antigen NEGATIVE NEGATIVE Final   C Diff toxin NEGATIVE NEGATIVE Final   C Diff interpretation No C. difficile detected.  Final  MRSA PCR Screening     Status: None   Collection Time: 05/07/17  6:24 AM  Result Value Ref Range Status   MRSA by PCR NEGATIVE NEGATIVE Final    Comment:        The GeneXpert MRSA Assay (FDA approved for NASAL specimens only), is one component of a comprehensive MRSA colonization surveillance program. It is not intended to diagnose MRSA infection nor to guide or monitor treatment for MRSA infections.     Coagulation Studies: No results for input(s): LABPROT, INR in the last 72 hours.  Urinalysis: Recent Labs    12/11/18 1512   COLORURINE YELLOW*  LABSPEC 1.013  PHURINE 5.0  GLUCOSEU NEGATIVE  HGBUR NEGATIVE  BILIRUBINUR NEGATIVE  KETONESUR NEGATIVE  PROTEINUR NEGATIVE  NITRITE NEGATIVE  LEUKOCYTESUR NEGATIVE      Imaging: No results found.   Medications:    .  allopurinol  200 mg Oral BID  . citalopram  10 mg Oral Daily  . docusate sodium  100 mg Oral BID  . fluticasone  2 spray Each Nare Daily  . furosemide  20 mg Oral Daily  . insulin aspart  0-9 Units Subcutaneous TID WC  . insulin glargine  35 Units Subcutaneous QHS  . isosorbide mononitrate  30 mg Oral BID  . magnesium gluconate  500 mg Oral BID  . methocarbamol  500 mg Oral BID  . metoprolol tartrate  50 mg Oral BID  . mometasone-formoterol  2 puff Inhalation BID  . pantoprazole  40 mg Oral Daily  . ranolazine  500 mg Oral BID  . rosuvastatin  40 mg Oral QPM  . sodium chloride flush  10 mL Intravenous Q12H   acetaminophen **OR** acetaminophen, alum & mag hydroxide-simeth, bisacodyl, chlorpheniramine-HYDROcodone, diphenhydrAMINE, guaiFENesin-dextromethorphan, nitroGLYCERIN, ondansetron **OR** ondansetron (ZOFRAN) IV, sodium phosphate, traZODone  Assessment/ Plan:  [name] is a 78 y.o. white male with prostate cancer, diabetes mellitus type 2, diverticulosis, gout, coronary artery disease, hyperlipidemia, hypertension, peripheral vascular disease, skin cancer, obstructive sleep apnea was admitted with unstable angina.    1. 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 History of hyperkalemia GFR appears to be at its baseline.   - Holding losartan - due to hyperkalemia and renal failure, recommend discontinue this medication for now.   2.  Hypertension.  Blood pressure at goal.   - furosemide, amlodipine, metoprolol. Monitor potassium   3.  Anemia chronic kidney disease.  Status post PRBC transfusion. Iron deficiency. Given IV venofer.  Appreciate GI and hematology input   LOS:  [place] 1/5/20201:39 PM

## 2018-12-14 NOTE — Op Note (Signed)
[facility] Gastroenterology Patient Name: Mike Decker Procedure Date: 12/14/2018 8:03 AM MRN: [id number] Account #: [id number] Date of Birth: Jan 02, 1941 Admit Type: Inpatient Age: 78 Room: [facility] ENDO ROOM 4 Gender: Male Note Status: Finalized Procedure:            Colonoscopy Indications:          Unexplained iron deficiency anemia Providers:            Varnita B. Bonna Gains MD, MD Referring MD:         Forest Gleason Md, MD (Referring MD) Medicines:            Monitored Anesthesia Care Complications:        No immediate complications. Procedure:            Pre-Anesthesia Assessment:                       - ASA Grade Assessment: III - A patient with severe                        systemic disease.                       - Prior to the procedure, a History and Physical was                        performed, and patient medications, allergies and                        sensitivities were reviewed. The patient's tolerance of                        previous anesthesia was reviewed.                       - The risks and benefits of the procedure and the                        sedation options and risks were discussed with the                        patient. All questions were answered and informed                        consent was obtained.                       - Patient identification and proposed procedure were                        verified prior to the procedure by the physician, the                        nurse, the anesthesiologist, the anesthetist and the                        technician. The procedure was verified in the procedure                        room.  After obtaining informed consent, the colonoscope was                        passed under direct vision. Throughout the procedure,                        the patient's blood pressure, pulse, and oxygen                        saturations were monitored continuously. The                         Colonoscope was introduced through the anus and                        advanced to the the cecum, identified by appendiceal                        orifice and ileocecal valve. The colonoscopy was                        performed with ease. The patient tolerated the                        procedure well. The quality of the bowel preparation                        was good except the ascending colon was fair and the                        cecum was fair. Findings:      Hemorrhoids were found on perianal exam.      Two sessile polyps were found in the sigmoid colon and ascending colon.       The polyps were 4 to 6 mm in size. These polyps were removed with a cold       snare. Resection and retrieval were complete. For hemostasis, one       hemostatic clip was successfully placed. There was no bleeding at the       end of the procedure.      Multiple diverticula were found in the sigmoid colon.      The exam was otherwise without abnormality.      The retroflexed view of the distal rectum and anal verge was normal and       showed no anal or rectal abnormalities. Impression:           - Hemorrhoids found on perianal exam.                       - Two 4 to 6 mm polyps in the sigmoid colon and in the                        ascending colon, removed with a cold snare. Resected                        and retrieved. Clip was placed.                       - Diverticulosis in the sigmoid colon.                       -  The examination was otherwise normal.                       - The distal rectum and anal verge are normal on                        retroflexion view.                       - No evidence of active or recent bleeding seen                        throughout the exam. Recommendation:       - Pt has not had any active bleeding clinically. 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 Pt should follow up in                        GI clinic as an outpatient and if anemia continues                        small bowel capsule can be considered at that time.                       - High fiber diet.                       - Advance diet as tolerated.                       - Continue present medications.                       - Await pathology results.                       - The findings and recommendations were discussed with                        the patient.                       - Return to primary care physician as previously                        scheduled.                       - Return to GI clinic in 4 weeks. Procedure Code(s):    --- Professional ---                       [phone], Colonoscopy, flexible; with removal of tumor(s),  polyp(s), or other lesion(s) by snare technique Diagnosis Code(s):    --- Professional ---                       D12.5, Benign neoplasm of sigmoid colon                       D12.2, Benign neoplasm of ascending colon                       K64.9, Unspecified hemorrhoids                       D50.9, Iron deficiency anemia, unspecified                       K57.30, Diverticulosis of large intestine without                        perforation or abscess without bleeding CPT copyright 2018 American Medical Association. All rights reserved. The codes documented in this report are preliminary and upon coder review may  be revised to meet current compliance requirements.  Vonda Antigua, MD Margretta Sidle B. Bonna Gains MD, MD 12/14/2018 9:07:02 AM This report has been signed electronically. Number of Addenda: 0 Note Initiated On: 12/14/2018 8:03 AM Scope Withdrawal Time: 0 hours 15 minutes 14 seconds  Total Procedure Duration: 0 hours 28 minutes 18 seconds  Estimated Blood Loss: Estimated blood loss: none.      [facility]

## 2018-12-14 NOTE — Anesthesia Post-op Follow-up Note (Signed)
Anesthesia QCDR form completed.        

## 2018-12-14 NOTE — Anesthesia Preprocedure Evaluation (Addendum)
Anesthesia Evaluation  Patient identified by MRN, date of birth, ID band Patient awake    Reviewed: Allergy & Precautions, NPO status   History of Anesthesia Complications Negative for: history of anesthetic complications  Airway Mallampati: III       Dental  (+) Dental Advidsory Given, Poor Dentition   Pulmonary shortness of breath and with exertion, sleep apnea and Continuous Positive Airway Pressure Ventilation , neg recent URI, former smoker,  Pulmonary fibrosis          Cardiovascular hypertension, Pt. on medications and Pt. on home beta blockers + angina (stable and cleared for this procedure) + CAD, + Past MI, + Cardiac Stents, + Peripheral Vascular Disease and +CHF  (-) CABG + dysrhythmias Atrial Fibrillation (-) Valvular Problems/Murmurs     Neuro/Psych TIA (speach, left sided weakness)   GI/Hepatic negative GI ROS, Neg liver ROS, GERD  Medicated and Poorly Controlled,  Endo/Other  diabetes, Type 2, Oral Hypoglycemic Agents  Renal/GU Renal InsufficiencyRenal disease     Musculoskeletal   Abdominal   Peds  Hematology  (+) Blood dyscrasia, anemia ,   Anesthesia Other Findings Past Medical History: No date: Arthritis No date: Atrioventricular canal (AVC)     Comment:  irregular heart beats 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 2002: Cancer ([place])     Comment:  prostate No date: Chronic diastolic CHF (congestive heart failure) (HCC) No date: Colon polyp No date: Diabetes mellitus without complication (HCC) No date: Diverticulosis No date: Gout No date: Heart disease No date: Hemangioma     Comment:  liver No date: Hyperlipidemia No date: Hypertension No date: Myocardial infarct (HCC) No date: Ocular hypertension No date: Peripheral vascular disease (HCC) No date: Skin cancer No date: Sleep apnea 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   Reproductive/Obstetrics negative OB ROS                             Anesthesia Physical  Anesthesia Plan  ASA: III  Anesthesia Plan: General   Post-op Pain Management:    Induction: Intravenous  PONV Risk Score and Plan: 2 and Propofol infusion and TIVA  Airway Management Planned: Nasal Cannula  Additional Equipment:   Intra-op Plan:   Post-operative Plan:   Informed Consent: I have reviewed the patients History and Physical, chart, labs and discussed the procedure including the risks, benefits and alternatives for the proposed anesthesia with the patient or authorized representative who has indicated his/her understanding and acceptance.     Plan Discussed with:   Anesthesia Plan Comments:         Anesthesia Quick Evaluation

## 2018-12-14 NOTE — Plan of Care (Signed)
  Problem: Education: Goal: Knowledge of General Education information will improve Description: Including pain rating scale, medication(s)/side effects and non-pharmacologic comfort measures Outcome: Progressing   Problem: Health Behavior/Discharge Planning: Goal: Ability to manage health-related needs will improve Outcome: Progressing   Problem: Activity: Goal: Risk for activity intolerance will decrease Outcome: Progressing   

## 2018-12-14 NOTE — Progress Notes (Signed)
   Vonda Antigua, MD [address], [place], [place], [place], [address] [address], [place], [place], [place], [address] Phone: [phone]  Fax: [phone]   Subjective: Patient denies any abdominal pain, nausea or vomiting, hematochezia or melena.  Hemoglobin 11.2 this morning.   Objective: Exam: Vital signs in last 24 hours: Vitals:   12/14/18 0208 12/14/18 0331 12/14/18 0405 12/14/18 0720  BP: (!) 138/50 (!) 120/42 (!) 118/49 (!) 138/53  Pulse: 60 60 61 62  Resp:  18 18   Temp: 98.4 F (36.9 C) 98.6 F (37 C) 98.2 F (36.8 C) 98.4 F (36.9 C)  TempSrc: Oral Oral  Oral  SpO2: 100% 98% 100% 100%  Weight:      Height:       Weight change:   Intake/Output Summary (Last 24 hours) at 12/14/2018 0737 Last data filed at 12/14/2018 0327 Gross per 24 hour  Intake 1470 ml  Output 3250 ml  Net -1780 ml    General: No acute distress, AAO x3 Abd: Soft, NT/ND, No HSM Skin: Warm, no rashes Neck: Supple, Trachea midline   Lab Results: Lab Results  Component Value Date   WBC 8.4 12/09/2018   HGB 11.2 (L) 12/14/2018   HCT 35.7 (L) 12/14/2018   MCV 82.1 12/09/2018   PLT 237 12/09/2018   Micro Results: No results found for this or any previous visit (from the past 240 hour(s)). Studies/Results: No results found. Medications:  Scheduled Meds: . allopurinol  200 mg Oral BID  . citalopram  10 mg Oral Daily  . docusate sodium  100 mg Oral BID  . fluticasone  2 spray Each Nare Daily  . furosemide  20 mg Oral Daily  . insulin aspart  0-9 Units Subcutaneous TID WC  . insulin glargine  35 Units Subcutaneous QHS  . isosorbide mononitrate  30 mg Oral BID  . magnesium gluconate  500 mg Oral BID  . methocarbamol  500 mg Oral BID  . metoprolol tartrate  50 mg Oral BID  . mometasone-formoterol  2 puff Inhalation BID  . pantoprazole (PROTONIX) IV  40 mg Intravenous Q12H  . ranolazine  500 mg Oral BID  . rosuvastatin  40 mg Oral QPM  . sodium chloride flush  10 mL  Intravenous Q12H   Continuous Infusions: PRN Meds:.acetaminophen **OR** acetaminophen, alum & mag hydroxide-simeth, bisacodyl, chlorpheniramine-HYDROcodone, diphenhydrAMINE, guaiFENesin-dextromethorphan, nitroGLYCERIN, ondansetron **OR** ondansetron (ZOFRAN) IV, sodium phosphate, traZODone   Assessment: Anemia   Plan: Patient will proceed with EGD and colonoscopy as planned Patient drank his prep No signs of bleeding Hemoglobin improved after PRBC transfusion yesterday  Please follow-up procedure report for findings and recommendations after procedures today  Patient has been evaluated by cardiology, Dr. Saralyn Pilar and they state "Patient is medically optimized forendoscopy and/orcolonoscopy."   Patient was advised extensively of the risks associated with the procedure, especially given his recent end STEMI and it was discussed that cardiology has evaluated the patient and and states that he is medically optimized for the procedures.  Patient realized understanding and would like to proceed with procedures as scheduled.  I have discussed alternative options, risks & benefits,  which include, but are not limited to, bleeding, infection, perforation,respiratory complication & drug reaction.  The patient agrees with this plan & written consent will be obtained.      LOS: 9 days   Vonda Antigua, MD 12/14/2018, 7:37 AM

## 2018-12-14 NOTE — Progress Notes (Signed)
[facility] NAME: Mike Decker    MR#:  [id number]  DATE OF BIRTH:  1941/02/12  SUBJECTIVE:  Patient no  hemoptysis today, better shortness of breath. on oxygen by nasal cannula 2 L. He is on CPAP at night.  S/p endoscopy. REVIEW OF SYSTEMS:  CONSTITUTIONAL: No fever, no dizziness, has generalized weakness.  EYES: No blurred or double vision.  EARS, NOSE, AND THROAT: No tinnitus or ear pain.  RESPIRATORY:  has cough, hemoptysis and shortness of breath, no wheezing. CARDIOVASCULAR: No chest pain, orthopnea, edema.  GASTROINTESTINAL: No nausea, vomiting, diarrhea or abdominal pain.  GENITOURINARY: No dysuria, hematuria.  ENDOCRINE: No polyuria, nocturia,  HEMATOLOGY: No anemia, easy bruising or bleeding SKIN: No rash or lesion. MUSCULOSKELETAL: No joint pain or arthritis.  No back pain. NEUROLOGIC: No tingling, numbness, weakness.  PSYCHIATRY: No anxiety or depression.   ROS  DRUG ALLERGIES:   Allergies  Allergen Reactions  . Gabapentin     Other reaction(s): Other (See Comments) Tremors  . Peanut-Containing Drug Products Anaphylaxis  . Penicillins Hives and Rash    Has patient had a PCN reaction causing immediate rash, facial/tongue/throat swelling, SOB or lightheadedness with hypotension: Yes Has patient had a PCN reaction causing severe rash involving mucus membranes or skin necrosis: No Has patient had a PCN reaction that required hospitalization No Has patient had a PCN reaction occurring within the last 10 years: No If all of the above answers are "NO", then may proceed with Cephalosporin use.  . Bee Venom Swelling  . Influenza Vaccines Hives  . Inh [Isoniazid] Hives  . Kenalog [Triamcinolone Acetonide] Hives  . Levaquin [Levofloxacin] Other (See Comments)    Tendon, ligament pain.   . Nalfon [Fenoprofen Calcium] Hives  . Naproxen   . Peanut Oil   . Nsaids Rash    Nalfon 600 Nalfon 600    VITALS:  Blood  pressure (!) 133/51, pulse 65, temperature 98.2 F (36.8 C), resp. rate 15, height 5\' 6"  (1.676 m), weight 88 kg, SpO2 100 %.  PHYSICAL EXAMINATION:  GENERAL:  78 y.o.-year-old patient lying in the bed with no acute distress.  Obesity. EYES: Pupils equal, round, reactive to light and accommodation. No scleral icterus. Extraocular muscles intact.  HEENT: Head atraumatic, normocephalic. Oropharynx and nasopharynx clear.  NECK:  Supple, no jugular venous distention. No thyroid enlargement, no tenderness.  LUNGS: Normal breath sounds bilaterally, no wheezing, rhonchi or crepitation.  No basilar crackles.  No use of accessory muscles of respiration.  CARDIOVASCULAR: S1, S2 normal. No murmurs, rubs, or gallops.  ABDOMEN: Soft, nontender, nondistended. Bowel sounds present. No organomegaly or mass.  EXTREMITIES: No pedal edema, cyanosis, or clubbing.  NEUROLOGIC: Cranial nerves II through XII are intact. Muscle strength 5/5 in all extremities. Sensation intact. Gait not checked.  PSYCHIATRIC: The patient is alert and oriented x 3.  SKIN: No obvious rash, lesion, or ulcer.   Physical Exam LABORATORY PANEL:   CBC Recent Labs  Lab 12/09/18 0316  12/14/18 0623  WBC 8.4  --   --   HGB 7.6*   < > 11.2*  HCT 25.2*  --  35.7*  PLT 237  --   --    < > = values in this interval not displayed.   ------------------------------------------------------------------------------------------------------------------  Chemistries  Recent Labs  Lab 12/11/18 0435  12/14/18 0623  NA 135   < > 136  K 4.3   < > 4.4  CL 108   < >  105  CO2 19*   < > 22  GLUCOSE 64*   < > 86  BUN 44*   < > 36*  CREATININE 2.40*   < > 1.92*  CALCIUM 8.5*   < > 9.0  MG 2.4  --   --    < > = values in this interval not displayed.   ------------------------------------------------------------------------------------------------------------------  Cardiac Enzymes Recent Labs  Lab 12/12/18 0240 12/12/18 0632  TROPONINI  0.03* 0.03*   ------------------------------------------------------------------------------------------------------------------  RADIOLOGY:  No results found.  ASSESSMENT AND PLAN:  *Acute unstable angina  Resolved on current regiment. Noted abnormal stress test on Thursday He was on heparin drip. Continue aspirin, Plavix, nitrates as needed, Ranexa, beta-blocker therapy, statin therapy, IV morphine for breakthrough pain, supplemental oxygen. Cardiac catheterization:  Successful cardiac cath with mild to moderate diffuse coronary disease throughout No significant high-grade obstructive coronary disease Patient appears to have possible microvascular disease LV gram was deferred because of renal insufficiency Continue aspirin and Plavix per Dr. Clayborn Bigness. Aspirin and Plavix is on hold due to hemoptysis and anemia.  *CKD stage III BL Cr around 2.1, Cr. 1.92, stable.  *Chronic diabetes mellitus type 2  Continue sliding scale insulin with Accu-Cheks per routine, levemir, cardiac/carbohydrate consistent diet  *Anemia of chronic disease.  Iron deficiency.   Per Dr. Tasia Catchings, IV rion x 3.  Follow-up her and [facility] physician as outpatient.  Hemoglobin down to 7.6, since the patient has symptomatic anemia and acute unstable angina, given 1 unit PRBC transfusion. Hb up to 8.7. follow-up hemoglobin and stool occult.  No active bleeding. Hemoglobin down to 7.6.  PRBC transfusion 1 unit and hemoglobin up to 11.2, Per Dr. Bonna Gains, EGD: A single non-bleeding angiodysplastic lesion in the duodenum. Treated with argon plasma coagulation  and colonoscopy: Polyp was removed.  *Chronic obstructive sleep apnea CPAP at at bedtime/as needed  Hypertension.  Continue Lasix, decrease dose of Lopressor and Imdur, but hold Norvasc, losartan.  Obesity.  Diet control and follow-up with PCP.  Generalized weakness.  The patient needs home health and PT.  Acute respiratory failure with hypoxia and hemoptysis.   D-dimer is elevated. 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 VQ scan: Normal examination.  Pulmonary consult from Dr. Humphrey Rolls. Aspirin and Plavix on hold due to hemoptysis. Per Dr. Humphrey Rolls, Hemoptysis multifactorial probably related to anticoagulation which was started with aspirin and Plavix also possibility that is related to underlying pulmonary edema.  Try to wean off oxygen.  But the patient may need home oxygen.  Continue nebulizer and Lasix.  Acute on chronic diastolic CHF, recent echo showed ejection fraction 60%. Continue p.o. Lasix 20 mg daily.  Generalized weakness.  He has recurrent admissions and need home health.  Discussed with Dr. Juleen China. All the records are reviewed and case discussed with Care Management/Social Workerr. Management plans discussed with the patient, his wife and they are in agreement.  CODE STATUS: full  TOTAL TIME TAKING CARE OF THIS PATIENT: 26 minutes.  POSSIBLE D/C IN 1-2 DAYS, DEPENDING ON CLINICAL CONDITION.   Demetrios Loll M.D on 12/14/2018   Between 7am to 6pm - Pager - [phone]  After 6pm go to [email or web address] - password [email or web address] [facility]  Office  [phone]  CC: Primary care physician; Cletis Athens, MD  Note: This dictation was prepared with Dragon dictation along with smaller phrase technology. Any transcriptional errors that result from this process are unintentional.

## 2018-12-14 NOTE — Transfer of Care (Signed)
Immediate Anesthesia Transfer of Care Note  Patient: Mike Decker  Procedure(s) Performed: ESOPHAGOGASTRODUODENOSCOPY (EGD) (N/A ) COLONOSCOPY (N/A )  Patient Location: PACU  Anesthesia Type:General  Level of Consciousness: sedated  Airway & Oxygen Therapy: Patient connected to nasal cannula oxygen  Post-op Assessment: Post -op Vital signs reviewed and stable  Post vital signs: stable  Last Vitals:  Vitals Value Taken Time  BP 123/49 12/14/2018  9:07 AM  Temp 36.8 C 12/14/2018  9:07 AM  Pulse 64 12/14/2018  9:10 AM  Resp 23 12/14/2018  9:10 AM  SpO2 100 % 12/14/2018  9:10 AM  Vitals shown include unvalidated device data.  Last Pain:  Vitals:   12/14/18 0907  TempSrc:   PainSc: Asleep      Patients Stated Pain Goal: 0 (42/68/34 1962)  Complications: No apparent anesthesia complications

## 2018-12-14 NOTE — Op Note (Signed)
[facility] Patient Name: Mike Decker Procedure Date: 12/14/2018 8:04 AM MRN: [id number] Account #: [id number] Date of Birth: January 16, 1941 Admit Type: Outpatient Age: 78 Room: [facility] ENDO ROOM 4 Gender: Male Note Status: Finalized Procedure:            Upper GI endoscopy Indications:          Unexplained iron deficiency anemia Providers:            Kooper Chriswell B. Bonna Gains MD, MD Referring MD:         Forest Gleason Md, MD (Referring MD) Medicines:            Monitored Anesthesia Care Complications:        No immediate complications. Procedure:            Pre-Anesthesia Assessment:                       - The risks and benefits of the procedure and the                        sedation options and risks were discussed with the                        patient. All questions were answered and informed                        consent was obtained.                       - Patient identification and proposed procedure were                        verified prior to the procedure.                       - ASA Grade Assessment: III - A patient with severe                        systemic disease.                       After obtaining informed consent, the endoscope was                        passed under direct vision. Throughout the procedure,                        the patient's blood pressure, pulse, and oxygen                        saturations were monitored continuously. The Endoscope                        was introduced through the mouth, and advanced to the                        fourth part of duodenum. The upper GI endoscopy was                        accomplished with ease. The patient tolerated the  procedure well. Findings:      The Z-line was irregular.      LA Grade A (one or more mucosal breaks less than 5 mm, not extending       between tops of 2 mucosal folds) esophagitis with no bleeding was found       in the distal esophagus.       The entire examined stomach was normal.      Lymphangiectasia was present in the fourth portion of the duodenum.      A single 5 mm angiodysplastic lesion without bleeding was found in the       third portion of the duodenum. Coagulation for bleeding prevention using       argon plasma was successful. Impression:           - Z-line irregular.                       - LA Grade A reflux esophagitis.                       - Normal stomach.                       - Duodenal mucosal lymphangiectasia.                       - A single non-bleeding angiodysplastic lesion in the                        duodenum. Treated with argon plasma coagulation (APC).                       - No specimens collected. 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                       - The findings and recommendations were discussed with                        the patient.                       - Return patient to hospital ward for ongoing care.                       - Follow an antireflux regimen. Procedure Code(s):    --- Professional ---  43255, Esophagogastroduodenoscopy, flexible, transoral;                        with control of bleeding, any method Diagnosis Code(s):    --- Professional ---                       K22.8, Other specified diseases of esophagus                       K21.0, Gastro-esophageal reflux disease with esophagitis                       I89.0, Lymphedema, not elsewhere classified                       K31.819, Angiodysplasia of stomach and duodenum without                        bleeding                       D50.9, Iron deficiency anemia,  unspecified CPT copyright 2018 American Medical Association. All rights reserved. The codes documented in this report are preliminary and upon coder review may  be revised to meet current compliance requirements.  Vonda Antigua, MD Margretta Sidle B. Bonna Gains MD, MD 12/14/2018 8:28:47 AM This report has been signed electronically. Number of Addenda: 0 Note Initiated On: 12/14/2018 8:04 AM Estimated Blood Loss: Estimated blood loss: none.      [facility]

## 2018-12-14 NOTE — Anesthesia Postprocedure Evaluation (Signed)
Anesthesia Post Note  Patient: Mike Decker  Procedure(s) Performed: ESOPHAGOGASTRODUODENOSCOPY (EGD) (N/A ) COLONOSCOPY (N/A )  Patient location during evaluation: Endoscopy Anesthesia Type: General Level of consciousness: awake and alert Pain management: pain level controlled Vital Signs Assessment: post-procedure vital signs reviewed and stable Respiratory status: spontaneous breathing, nonlabored ventilation, respiratory function stable and patient connected to nasal cannula oxygen Cardiovascular status: blood pressure returned to baseline and stable Postop Assessment: no apparent nausea or vomiting Anesthetic complications: no     Last Vitals:  Vitals:   12/14/18 0931 12/14/18 0934  BP: (!) 137/51 (!) 133/51  Pulse: 64 65  Resp: 20 15  Temp:    SpO2: 100% 100%    Last Pain:  Vitals:   12/14/18 0934  TempSrc:   PainSc: 0-No pain                 Martha Clan

## 2018-12-15 ENCOUNTER — Encounter: Payer: Self-pay | Admitting: Gastroenterology

## 2018-12-15 LAB — BASIC METABOLIC PANEL
Anion gap: 8 (ref 5–15)
BUN: 28 mg/dL — ABNORMAL HIGH (ref 8–23)
CO2: 20 mmol/L — ABNORMAL LOW (ref 22–32)
Calcium: 8.3 mg/dL — ABNORMAL LOW (ref 8.9–10.3)
Chloride: 107 mmol/L (ref 98–111)
Creatinine, Ser: 1.66 mg/dL — ABNORMAL HIGH (ref 0.61–1.24)
GFR calc Af Amer: 45 mL/min — ABNORMAL LOW (ref >60)
GFR calc non Af Amer: 39 mL/min — ABNORMAL LOW (ref >60)
GLUCOSE: 184 mg/dL — AB (ref 70–99)
Potassium: 4.3 mmol/L (ref 3.5–5.1)
Sodium: 135 mmol/L (ref 135–145)

## 2018-12-15 LAB — TYPE AND SCREEN
ABO/RH(D): A POS
Antibody Screen: POSITIVE
Donor AG Type: NEGATIVE
Donor AG Type: NEGATIVE
UNIT DIVISION: 0
Unit division: 0
Unit division: 0
Unit division: 0

## 2018-12-15 LAB — BPAM RBC
Blood Product Expiration Date: 202001122359
Blood Product Expiration Date: 202001142359
Blood Product Expiration Date: 202001182359
Blood Product Expiration Date: 202001182359
ISSUE DATE / TIME: 202001042034
ISSUE DATE / TIME: 202001050125
Unit Type and Rh: 6200
Unit Type and Rh: 6200
Unit Type and Rh: 6200
Unit Type and Rh: 6200

## 2018-12-15 LAB — GLUCOSE, CAPILLARY
GLUCOSE-CAPILLARY: 247 mg/dL — AB (ref 70–99)
Glucose-Capillary: 178 mg/dL — ABNORMAL HIGH (ref 70–99)
Glucose-Capillary: 154 mg/dL — ABNORMAL HIGH (ref 70–99)

## 2018-12-15 LAB — HEMOGLOBIN: Hemoglobin: 10.8 g/dL — ABNORMAL LOW (ref 13.0–17.0)

## 2018-12-15 LAB — GLOMERULAR BASEMENT MEMBRANE ANTIBODIES: GBM Ab: 4 units (ref 0–20)

## 2018-12-15 MED ORDER — CLOPIDOGREL BISULFATE 75 MG PO TABS
75.0000 mg | ORAL_TABLET | Freq: Every day | ORAL | Status: DC
  Administered 2018-12-15: 75 mg via ORAL
  Filled 2018-12-15: qty 1

## 2018-12-15 MED ORDER — ASPIRIN 81 MG PO CHEW
81.0000 mg | CHEWABLE_TABLET | Freq: Every day | ORAL | Status: DC
  Administered 2018-12-15: 81 mg via ORAL
  Filled 2018-12-15: qty 1

## 2018-12-15 MED ORDER — RANOLAZINE ER 500 MG PO TB12: 500.0000 mg | ORAL_TABLET | Freq: Two times a day (BID) | ORAL | 1 refills | Status: DC

## 2018-12-15 MED ORDER — ISOSORBIDE MONONITRATE ER 30 MG PO TB24: 30.0000 mg | ORAL_TABLET | Freq: Two times a day (BID) | ORAL | 0 refills | Status: DC

## 2018-12-15 NOTE — Care Management Note (Addendum)
Case Management Note  Patient Details  Name: Mike Decker MRN: [id number] Date of Birth: June 09, 1941  Subjective/Objective:    Patient is discharging to home today.  Current with PCP.   Denies difficulties obtaining medications or accessing medical care.  Offered list of home health agencies and ratings.  Patient chose [facility].  Referral made and accepted for home health RN and PT.  Wife will transport patient to home today.  Has multiple follow up appointments scheduled.  Using walker in room; on room air.  No further needs identified at this time.      [facility] referral made.           Action/Plan:   Expected Discharge Date:  12/15/18               Expected Discharge Plan:  [place]  In-House Referral:     Discharge planning Services  CM Consult  Post Acute Care Choice:  Home Health Choice offered to:  Patient  DME Arranged:    DME Agency:  [facility]  HH Arranged:  RN, PT [facility] Agency:     Status of Service:  Completed, signed off  If discussed at Accident of Stay Meetings, dates discussed:    Additional Comments:  Elza Rafter, RN 12/15/2018, 1:47 PM

## 2018-12-15 NOTE — Discharge Instructions (Signed)
HHPT °

## 2018-12-15 NOTE — Progress Notes (Signed)
Hematology/Oncology Progress Note [facility] Telephone:([phone] [phone]  Patient Care Team: Cletis Athens, MD as PCP - General (Cardiology) Cletis Athens, MD as Referring Physician (Cardiology) Christene Lye, MD (General Surgery) Alfonzo Feller, RN as [facility]   Name of the patient: Mike Decker  [id number]  [birth year]  Date of visit: 12/15/18   INTERVAL HISTORY-  s/p IV Venofer 200 mg x 3, status post 2 unit of PRBC blood transfusion due to symptomatic anemia. Due to the progressively worsening of anemia, GI was consulted and patient underwent upper endoscopy and colonoscopy on 12/14/2018 Findings includes hemorrhoids, 2 polyps in the sigmoid colon and ascending colon, removed.  Diverticulosis in the sigmoid colon, no evidence of active or recent bleeding.  Z line irregular.  LA grade a reflux esophagitis.  Duodenal mucosa lymphangiectasia.  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  Treated with argon plasma coagulation.  Hemoglobin improved after the second unit of PRBC transfusion. Today he feels much better.  Shortness of breath and fatigue have improved.  Off oxygen.    Review of systems- Review of Systems  Constitutional: Positive for fatigue. Negative for appetite change, chills, fever and unexpected weight change.  HENT:   Negative for hearing loss and voice change.   Eyes: Negative for eye problems and icterus.  Respiratory: Negative for chest tightness, cough and shortness of breath.   Cardiovascular: Negative for chest pain and leg swelling.  Gastrointestinal: Negative for abdominal distention and abdominal pain.  Endocrine: Negative for hot flashes.  Genitourinary: Negative for difficulty urinating, dysuria and frequency.   Musculoskeletal: Negative for arthralgias.  Skin: Negative for itching and rash.  Neurological: Negative for light-headedness and numbness.    Hematological: Negative for adenopathy. Does not bruise/bleed easily.  Psychiatric/Behavioral: Negative for confusion.    Allergies  Allergen Reactions  . Gabapentin     Other reaction(s): Other (See Comments) Tremors  . Peanut-Containing Drug Products Anaphylaxis  . Penicillins Hives and Rash    Has patient had a PCN reaction causing immediate rash, facial/tongue/throat swelling, SOB or lightheadedness with hypotension: Yes Has patient had a PCN reaction causing severe rash involving mucus membranes or skin necrosis: No Has patient had a PCN reaction that required hospitalization No Has patient had a PCN reaction occurring within the last 10 years: No If all of the above answers are "NO", then may proceed with Cephalosporin use.  . Bee Venom Swelling  . Influenza Vaccines Hives  . Inh [Isoniazid] Hives  . Kenalog [Triamcinolone Acetonide] Hives  . Levaquin [Levofloxacin] Other (See Comments)    Tendon, ligament pain.   . Nalfon [Fenoprofen Calcium] Hives  . Naproxen   . Peanut Oil   . Nsaids Rash    Nalfon 600 Nalfon 600    Patient Active Problem List   Diagnosis Date Noted  . Polyp of sigmoid colon   . Benign neoplasm of ascending colon   . Hemorrhoids without complication   . Diverticulosis of large intestine without diverticulitis   . Reflux esophagitis   . Lymphangiectasia   . Arteriovenous malformation of duodenum   . Symptomatic anemia   . SOB (shortness of breath)   . Iron deficiency anemia due to chronic blood loss   . Unstable angina ([place])   . Anemia of chronic renal failure, stage 4 (severe) ([place])   . Chest pain 11/14/2018  . CAD (coronary artery disease) 02/27/2018  . Sacroiliac joint pain 10/08/2017  . Pain in  limb 09/24/2017  . Neuropathy 07/24/2017  . NSTEMI (non-ST elevated myocardial infarction) ([place]) 05/06/2017  . PAD (peripheral artery disease) ([place]) 05/01/2017  . History of stroke 01/02/2017  . Barrett's esophagus 10/02/2016  . Carotid  stenosis 10/02/2016  . Essential hypertension 10/02/2016  . Hyperlipidemia 10/02/2016  . Diabetes ([place]) 10/02/2016  . 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 Uncontrolled type 2 diabetes mellitus with hyperglycemia, with long-term current use of insulin ([place]) 04/10/2016  . TIA (transient ischemic attack) 10/08/2015     Past Medical History:  Diagnosis Date  . Arthritis   . Atrioventricular canal (AVC)    irregular heart beats  . Barrett esophagus   . Cancer [facility]) 2002   prostate  . Chronic diastolic CHF (congestive heart failure) ([place])   . Colon polyp   . Diabetes mellitus without complication ([place])   . Diverticulosis   . Gout   . Heart disease   . Hemangioma    liver  . Hyperlipidemia   . Hypertension   . Myocardial infarct ([place])   . Ocular hypertension   . Peripheral vascular disease ([place])   . Skin cancer   . Sleep apnea   . Vitreoretinal degeneration      Past Surgical History:  Procedure Laterality Date  . CATARACT EXTRACTION  2011, 2012  . COLONOSCOPY N/A 12/14/2018   Procedure: COLONOSCOPY;  Surgeon: Virgel Manifold, MD;  Location: [facility] ENDOSCOPY;  Service: Endoscopy;  Laterality: N/A;  . CORONARY ANGIOPLASTY WITH STENT PLACEMENT  2012  . ESOPHAGOGASTRODUODENOSCOPY N/A 12/14/2018   Procedure: ESOPHAGOGASTRODUODENOSCOPY (EGD);  Surgeon: Virgel Manifold, MD;  Location[facility] ENDOSCOPY;  Service: Endoscopy;  Laterality: N/A;  . ESOPHAGOGASTRODUODENOSCOPY (EGD) WITH PROPOFOL N/A 06/01/2016   Procedure: ESOPHAGOGASTRODUODENOSCOPY (EGD) WITH PROPOFOL;  Surgeon: Manya Silvas, MD;  Location: [facility] ENDOSCOPY;  Service: Endoscopy;  Laterality: N/A;  . HERNIA REPAIR     x2  . INTRAOCULAR LENS INSERTION    . LEFT HEART CATH AND CORONARY ANGIOGRAPHY N/A 05/09/2017   Procedure: Left Heart Cath and Coronary Angiography;  Surgeon: Yolonda Kida, MD;  Location: [facility] CV LAB;  Service: Cardiovascular;  Laterality: N/A;  . LEFT HEART  CATH AND CORONARY ANGIOGRAPHY N/A 12/08/2018   Procedure: LEFT HEART CATH AND CORONARY ANGIOGRAPHY and possible PCI and stent;  Surgeon: Yolonda Kida, MD;  Location: [facility] CV LAB;  Service: Cardiovascular;  Laterality: N/A;  . NOSE SURGERY     submucous resection  . PROSTATE SURGERY  2002  . ROTATOR CUFF REPAIR Right     Social History   Socioeconomic History  . Marital status: Married    Spouse name: Not on file  . Number of children: Not on file  . Years of education: Not on file  . Highest education level: Not on file  Occupational History  . Not on file  Social Needs  . Financial resource strain: Not on file  . Food insecurity:    Worry: Not on file    Inability: Not on file  . Transportation needs:    Medical: Not on file    Non-medical: Not on file  Tobacco Use  . Smoking status: Former Smoker    Years: 20.00    Types: Cigarettes    Last attempt to quit: 12/10/1976    Years since quitting: 42.0  . Smokeless tobacco: Never Used  Substance and Sexual Activity  . Alcohol use: No  . Drug use: No  . Sexual activity: Not  on file  Lifestyle  . Physical activity:    Days per week: Not on file    Minutes per session: Not on file  . Stress: Not on file  Relationships  . Social connections:    Talks on phone: Not on file    Gets together: Not on file    Attends religious service: Not on file    Active member of club or organization: Not on file    Attends meetings of clubs or organizations: Not on file    Relationship status: Not on file  . Intimate partner violence:    Fear of current or ex partner: Not on file    Emotionally abused: Not on file    Physically abused: Not on file    Forced sexual activity: Not on file  Other Topics Concern  . Not on file  Social History Narrative  . Not on file     Family History  Problem Relation Age of Onset  . Stroke Maternal Grandfather      Current Facility-Administered Medications:  .  acetaminophen  (TYLENOL) tablet 650 mg, 650 mg, Oral, Q6H PRN, 650 mg at 12/11/18 2123 **OR** acetaminophen (TYLENOL) suppository 650 mg, 650 mg, Rectal, Q6H PRN, Epifanio Lesches, MD .  allopurinol (ZYLOPRIM) tablet 200 mg, 200 mg, Oral, BID, Epifanio Lesches, MD, 200 mg at 12/15/18 0817 .  alum & mag hydroxide-simeth (MAALOX/MYLANTA) 200-200-20 MG/5ML suspension 15 mL, 15 mL, Oral, Q6H PRN, Demetrios Loll, MD, 15 mL at 12/11/18 2122 .  aspirin chewable tablet 81 mg, 81 mg, Oral, Daily, Demetrios Loll, MD, 81 mg at 12/15/18 1034 .  bisacodyl (DULCOLAX) EC tablet 10 mg, 10 mg, Oral, Daily PRN, Demetrios Loll, MD, 10 mg at 12/12/18 1651 .  chlorpheniramine-HYDROcodone (TUSSIONEX) 10-8 MG/5ML suspension 5 mL, 5 mL, Oral, QHS PRN, Demetrios Loll, MD, 5 mL at 12/14/18 2242 .  citalopram (CELEXA) tablet 10 mg, 10 mg, Oral, Daily, Epifanio Lesches, MD, 10 mg at 12/15/18 0818 .  clopidogrel (PLAVIX) tablet 75 mg, 75 mg, Oral, Daily, Demetrios Loll, MD, 75 mg at 12/15/18 1035 .  diphenhydrAMINE (BENADRYL) capsule 25 mg, 25 mg, Oral, Q6H PRN, Demetrios Loll, MD, 25 mg at 12/13/18 2027 .  docusate sodium (COLACE) capsule 100 mg, 100 mg, Oral, BID, Epifanio Lesches, MD, 100 mg at 12/15/18 0819 .  fluticasone (FLONASE) 50 MCG/ACT nasal spray 2 spray, 2 spray, Each Nare, Daily, Epifanio Lesches, MD, 2 spray at 12/15/18 0816 .  furosemide (LASIX) tablet 20 mg, 20 mg, Oral, Daily, Demetrios Loll, MD, 20 mg at 12/15/18 0818 .  guaiFENesin-dextromethorphan (ROBITUSSIN DM) 100-10 MG/5ML syrup 5 mL, 5 mL, Oral, Q4H PRN, Demetrios Loll, MD, 5 mL at 12/14/18 1631 .  insulin aspart (novoLOG) injection 0-9 Units, 0-9 Units, Subcutaneous, TID WC, Epifanio Lesches, MD, 3 Units at 12/15/18 1227 .  insulin glargine (LANTUS) injection 35 Units, 35 Units, Subcutaneous, QHS, Salary, Montell D, MD, 35 Units at 12/14/18 2144 .  isosorbide mononitrate (IMDUR) 24 hr tablet 30 mg, 30 mg, Oral, BID, Demetrios Loll, MD, 30 mg at 12/15/18 0818 .  magnesium  gluconate (MAGONATE) tablet 500 mg, 500 mg, Oral, BID, Demetrios Loll, MD, 500 mg at 12/15/18 0817 .  methocarbamol (ROBAXIN) tablet 500 mg, 500 mg, Oral, BID, Epifanio Lesches, MD, 500 mg at 12/15/18 0818 .  metoprolol tartrate (LOPRESSOR) tablet 50 mg, 50 mg, Oral, BID, Demetrios Loll, MD, 50 mg at 12/15/18 0818 .  mometasone-formoterol (DULERA) 200-5 MCG/ACT inhaler 2 puff, 2 puff, Inhalation, BID,  Epifanio Lesches, MD, 2 puff at 12/15/18 0816 .  nitroGLYCERIN (NITROSTAT) SL tablet 0.4 mg, 0.4 mg, Sublingual, Q5 min PRN, Salary, Montell D, MD, 0.4 mg at 12/11/18 2329 .  ondansetron (ZOFRAN) tablet 4 mg, 4 mg, Oral, Q6H PRN **OR** ondansetron (ZOFRAN) injection 4 mg, 4 mg, Intravenous, Q6H PRN, Epifanio Lesches, MD .  pantoprazole (PROTONIX) EC tablet 40 mg, 40 mg, Oral, Daily, Demetrios Loll, MD, 40 mg at 12/15/18 0819 .  ranolazine (RANEXA) 12 hr tablet 500 mg, 500 mg, Oral, BID, Callwood, Dwayne D, MD, 500 mg at 12/15/18 0817 .  rosuvastatin (CRESTOR) tablet 40 mg, 40 mg, Oral, QPM, Epifanio Lesches, MD, 40 mg at 12/14/18 1710 .  sodium chloride flush (NS) 0.9 % injection 10 mL, 10 mL, Intravenous, Q12H, Demetrios Loll, MD, 10 mL at 12/15/18 0819 .  sodium phosphate (FLEET) 7-19 GM/118ML enema 1 enema, 1 enema, Rectal, Daily PRN, Demetrios Loll, MD, 1 enema at 12/10/18 1552 .  traZODone (DESYREL) tablet 50 mg, 50 mg, Oral, QHS PRN, Lance Coon, MD, 50 mg at 12/14/18 2241   Physical exam:  Vitals:   12/14/18 1627 12/14/18 2150 12/15/18 0314 12/15/18 0733  BP: (!) 158/63 (!) 151/65 (!) 111/42 (!) 122/53  Pulse: 76 77 69 74  Resp:  18 18 18   Temp: 98.2 F (36.8 C) 98.8 F (37.1 C) 98 F (36.7 C) 98.4 F (36.9 C)  TempSrc: Oral Oral  Oral  SpO2: 99% 100% 98% 97%  Weight:   191 lb (86.6 kg)   Height:       Physical Exam  Constitutional: He is oriented to person, place, and time. No distress.  HENT:  Head: Normocephalic and atraumatic.  Nose: Nose normal.  Mouth/Throat: Oropharynx is  clear and moist. No oropharyngeal exudate.  Eyes: Pupils are equal, round, and reactive to light. EOM are normal. No scleral icterus.  Neck: Normal range of motion. Neck supple.  Cardiovascular: Normal rate and regular rhythm.  Murmur heard. Pulmonary/Chest: Effort normal. No respiratory distress. He has no rales. He exhibits no tenderness.  Breathing comfortable on room air.   Abdominal: Soft. He exhibits no distension. There is no abdominal tenderness.  Musculoskeletal: Normal range of motion.        General: No edema.  Neurological: He is alert and oriented to person, place, and time. No cranial nerve deficit.  Skin: Skin is warm and dry. He is not diaphoretic. No erythema. There is pallor.  Psychiatric: Affect normal.       CMP Latest Ref Rng & Units 12/15/2018  Glucose 70 - 99 mg/dL 184(H)  BUN 8 - 23 mg/dL 28(H)  Creatinine 0.61 - 1.24 mg/dL 1.66(H)  Sodium 135 - 145 mmol/L 135  Potassium 3.5 - 5.1 mmol/L 4.3  Chloride 98 - 111 mmol/L 107  CO2 22 - 32 mmol/L 20(L)  Calcium 8.9 - 10.3 mg/dL 8.3(L)  Total Protein 6.5 - 8.1 g/dL -  Total Bilirubin 0.3 - 1.2 mg/dL -  Alkaline Phos 38 - 126 U/L -  AST 15 - 41 U/L -  ALT 17 - 63 U/L -   CBC Latest Ref Rng & Units 12/15/2018  WBC 4.0 - 10.5 K/uL -  Hemoglobin 13.0 - 17.0 g/dL 10.8(L)  Hematocrit 39.0 - 52.0 % -  Platelets 150 - 400 K/uL -   RADIOGRAPHIC STUDIES: I have personally reviewed the radiological images as listed and agreed with the findings in the report.  Dg Chest 1 View  Result Date: 11/16/2018 CLINICAL DATA:  History of pneumonia. EXAM: CHEST  1 VIEW COMPARISON:  11/14/2018 FINDINGS: The heart is enlarged but stable. No infiltrates, edema or effusions. The bony thorax is intact. IMPRESSION: No acute cardiopulmonary findings.  Stable cardiac enlargement. Electronically Signed   By: Marijo Sanes M.D.   On: 11/16/2018 13:11   Dg Chest 2 View  Result Date: 12/05/2018 CLINICAL DATA:  Chest pain began this morning  EXAM: CHEST - 2 VIEW COMPARISON:  Chest x-ray of 11/16/2017 FINDINGS: No active infiltrate or effusion is seen. Mediastinal and hilar contours appear stable. Cardiomegaly is stable. No acute bony abnormality is seen. Coronary artery stent is noted. IMPRESSION: Stable cardiomegaly.  No active lung disease. Electronically Signed   By: Ivar Drape M.D.   On: 12/05/2018 10:47   Ct Chest High Resolution  Result Date: 12/11/2018 CLINICAL DATA:  Inpatient. Hemoptysis. Dyspnea. Generalized weakness. Hypoxia. EXAM: CT CHEST WITHOUT CONTRAST TECHNIQUE: Multidetector CT imaging of the chest was performed following the standard protocol without intravenous contrast. High resolution imaging of the lungs, as well as inspiratory and expiratory imaging, was performed. COMPARISON:  05/06/2017 chest CT angiogram. Chest radiograph from one day prior. FINDINGS: Cardiovascular: Top-normal heart size. No significant pericardial effusion/thickening. Three-vessel coronary atherosclerosis. Aortic valvular calcification. Atherosclerotic nonaneurysmal thoracic aorta. Normal caliber pulmonary arteries. Mediastinum/Nodes: No discrete thyroid nodules. Unremarkable esophagus. No axillary adenopathy. Mildly enlarged 1.2 cm subcarinal node (series 2/image 73), unchanged since 05/06/2017 chest CT. No new pathologically enlarged mediastinal nodes. No discrete hilar adenopathy on this noncontrast scan. Lungs/Pleura: No pneumothorax. Small dependent bilateral pleural effusions. No acute consolidative airspace disease, lung masses or significant pulmonary nodules. Scattered small calcified granulomas throughout both lungs. No significant air trapping on the expiration sequence. There is patchy ground-glass opacity and interlobular septal thickening throughout both lungs, most prominent in the dependent lower lungs. There is mild patchy subpleural reticulation with minimal traction bronchiolectasis in both lungs with a mild basilar predominance. No  frank honeycombing. Upper abdomen: Small hiatal hernia. Hypodense 3.0 cm right liver dome mass (series 2/image 103), unchanged since 03/29/2014 CT, where it was seen to represent a hemangioma. Musculoskeletal: No aggressive appearing focal osseous lesions. Mild thoracic spondylosis. IMPRESSION: 1. Aortic valvular calcification, which can be correlated with aortic valvular stenosis. Consider echocardiographic correlation. Top-normal heart size. 2. Small dependent bilateral pleural effusions. 3. Patchy ground-glass opacity and interlobular septal thickening, most prominent in the dependent lower lungs. Mild patchy subpleural reticulation with minimal traction bronchiolectasis, with a mild basilar predominance. No honeycombing. 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 A follow-up high-resolution chest CT study could be obtained in 12 months to assess temporal pattern stability, as clinically warranted. 4. Three-vessel coronary atherosclerosis. 5. Small hiatal hernia. Aortic Atherosclerosis (ICD10-I70.0). Electronically Signed   By: Ilona Sorrel M.D.   On: 12/11/2018 20:26   Nm Pulmonary Perf And Vent  Result Date: 12/11/2018 CLINICAL DATA:  Acute onset of shortness of breath. Hemoptysis. Chronic renal failure precludes IV contrast administration. EXAM: NUCLEAR MEDICINE VENTILATION - PERFUSION LUNG SCAN TECHNIQUE: Ventilation images were obtained in multiple projections using inhaled aerosol Tc-13m DTPA. Perfusion images were obtained in multiple projections after intravenous injection of Tc-83m MAA. RADIOPHARMACEUTICALS:  33.3 mCi of Tc-41m DTPA aerosol inhalation and 4.3 mCi Tc27m MAA IV COMPARISON:  No prior nuclear imaging. Chest x-rays 12/10/2018, 12/05/2018 and earlier. FINDINGS: Ventilation: No focal  ventilatory defect. Ingested aerosol in the  esophagus and stomach. Perfusion: Normal perfusion. No wedge-shaped perfusion defects to indicate pulmonary embolism. IMPRESSION: Normal examination. Electronically Signed   By: Evangeline Dakin M.D.   On: 12/11/2018 13:05   Dg Chest Port 1 View  Result Date: 12/10/2018 CLINICAL DATA:  Patient with shortness of breath. EXAM: PORTABLE CHEST 1 VIEW COMPARISON:  Chest radiograph 12/05/2018 FINDINGS: Monitoring leads overlie the patient. Stable cardiomegaly. Interval development of bilateral interstitial pulmonary opacities. No large pleural effusion or pneumothorax. IMPRESSION: Cardiomegaly and interstitial opacities favored to represent edema. Electronically Signed   By: Lovey Newcomer M.D.   On: 12/10/2018 15:32    Assessment and plan-  Patient is a 78 y.o. male with multiple comorbidities including hypertension, diabetes, CKD, previous NSTEMI present for evaluation of chest pain.  # Acute on chronic anemia/ iron deficiency S/p IV venofer 200mg  x 3 as well as 2 units of PRBC transfusion.   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 Hemoglobin is stable.  He originally has scheduled an appointment with me tomorrow which can be rescheduled.  Will recheck hemoglobin in 2 weeks and assess any need for additional IV iron.  Thank you for allowing me to participate in the care of this patient.  Total face to face encounter time for this patient visit was 25 min. >50% of the time was  spent in counseling and coordination of care.   Earlie Server, MD, PhD Hematology Oncology [facility] Pager- [phone] 12/15/2018

## 2018-12-15 NOTE — Discharge Summary (Signed)
[facility] [place] NAME: Mike Decker    MR#:  [id number]  DATE OF BIRTH:  December 19, 1940  DATE OF ADMISSION:  12/05/2018   ADMITTING PHYSICIAN: Epifanio Lesches, MD  DATE OF DISCHARGE: 12/15/2018 PRIMARY CARE PHYSICIAN: Cletis Athens, MD   ADMISSION DIAGNOSIS:  Unstable angina ([place]) [I20.0] DISCHARGE DIAGNOSIS:  Active Problems:   NSTEMI (non-ST elevated myocardial infarction) (HCC)   Iron deficiency anemia due to chronic blood loss   Unstable angina (HCC)   Anemia of chronic renal failure, stage 4 (severe) (HCC)   SOB (shortness of breath)   Symptomatic anemia   Polyp of sigmoid colon   Benign neoplasm of ascending colon   Hemorrhoids without complication   Diverticulosis of large intestine without diverticulitis   Reflux esophagitis   Lymphangiectasia   Arteriovenous malformation of duodenum  SECONDARY DIAGNOSIS:   Past Medical History:  Diagnosis Date  . Arthritis   . Atrioventricular canal (AVC)    irregular heart beats  . Barrett esophagus   . Cancer [facility]) 2002   prostate  . Chronic diastolic CHF (congestive heart failure) ([place])   . Colon polyp   . Diabetes mellitus without complication ([place])   . Diverticulosis   . Gout   . Heart disease   . Hemangioma    liver  . Hyperlipidemia   . Hypertension   . Myocardial infarct ([place])   . Ocular hypertension   . Peripheral vascular disease ([place])   . Skin cancer   . Sleep apnea   . Vitreoretinal degeneration    HOSPITAL COURSE:  *Acute unstable angina  Resolved on current regiment. Noted abnormal stress test on Thursday He was on heparin drip. Continue aspirin, Plavix, nitrates as needed, Ranexa, beta-blocker therapy, statin therapy, IV morphine for breakthrough pain, supplemental oxygen. Cardiac catheterization:  Successful cardiac cath with mild to moderate diffuse coronary disease throughout No significant high-grade obstructive coronary disease Patient  appears to have possible microvascular disease LV gram was deferred because of renal insufficiency Continue aspirin and Plavix per Dr. Clayborn Bigness. Aspirin and Plavix is on hold due to hemoptysis and anemia. Resume aspirin and Plavix.  *CKD stage III BL Cr around 2.1, Cr. 1.66 now, stable.  *Chronic diabetes mellitus type 2  Continue sliding scale insulin with Accu-Cheks per routine, levemir, cardiac/carbohydrate consistent diet  *Anemia of chronic disease.  Iron deficiency.   Per Dr. Tasia Catchings, IV rion x 3.  Follow-up her and [facility] physician as outpatient.  Hemoglobin down to 7.6, since the patient has symptomatic anemia and acute unstable angina, given 1 unit PRBC transfusion. Hb up to 8.7. follow-up hemoglobin and stool occult.  No active bleeding. Hemoglobin down to 7.6.  PRBC transfusion 1 unit and hemoglobin up to 10.8, Per Dr. Bonna Gains, EGD: A single non-bleeding angiodysplastic lesion in the duodenum. Treated with argon plasma coagulation  and colonoscopy: Polyp was removed.  Follow-up with GI physician as outpatient.  Continue home Protonix twice daily.  *Chronic obstructive sleep apnea CPAP at at bedtime/as needed  Hypertension.  Continue Lasix, decreased dose of Lopressor and Imdur, but hold Norvasc and losartan.  Obesity.  Diet control and follow-up with PCP.  Generalized weakness.  The patient needs home health and PT.  Acute respiratory failure with hypoxia and hemoptysis.  D-dimer is elevated. 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 VQ scan: Normal examination.  Pulmonary consult from Dr. Humphrey Rolls. Aspirin and Plavix  on hold due to hemoptysis. Per Dr. Humphrey Rolls, Hemoptysis multifactorial probably related to anticoagulation which was started with aspirin and Plavix also possibility that is related to underlying pulmonary edema.  Weaned off oxygen.  Continue nebulizer and Lasix.   Acute on  chronic diastolic CHF, recent echo showed ejection fraction 60%. Continue home p.o. Lasix 40 mg daily.  Generalized weakness.  He has recurrent admissions and need home healthPT. I discussed with Dr. Clayborn Bigness. DISCHARGE CONDITIONS:  Stable, discharge to home with home health and PT today. CONSULTS OBTAINED:  Treatment Team:  Anthonette Legato, MD Yolonda Kida, MD Tish Frederickson, MD Allyne Gee, MD Isaias Cowman, MD [name], MD DRUG ALLERGIES:   Allergies  Allergen Reactions  . Gabapentin     Other reaction(s): Other (See Comments) Tremors  . Peanut-Containing Drug Products Anaphylaxis  . Penicillins Hives and Rash    Has patient had a PCN reaction causing immediate rash, facial/tongue/throat swelling, SOB or lightheadedness with hypotension: Yes Has patient had a PCN reaction causing severe rash involving mucus membranes or skin necrosis: No Has patient had a PCN reaction that required hospitalization No Has patient had a PCN reaction occurring within the last 10 years: No If all of the above answers are "NO", then may proceed with Cephalosporin use.  . Bee Venom Swelling  . Influenza Vaccines Hives  . Inh [Isoniazid] Hives  . Kenalog [Triamcinolone Acetonide] Hives  . Levaquin [Levofloxacin] Other (See Comments)    Tendon, ligament pain.   . Nalfon [Fenoprofen Calcium] Hives  . Naproxen   . Peanut Oil   . Nsaids Rash    Nalfon 600 Nalfon 600   DISCHARGE MEDICATIONS:   Allergies as of 12/15/2018      Reactions   Gabapentin    Other reaction(s): Other (See Comments) Tremors   Peanut-containing Drug Products Anaphylaxis   Penicillins Hives, Rash   Has patient had a PCN reaction causing immediate rash, facial/tongue/throat swelling, SOB or lightheadedness with hypotension: Yes Has patient had a PCN reaction causing severe rash involving mucus membranes or skin necrosis: No Has patient had a PCN reaction that required hospitalization No Has patient  had a PCN reaction occurring within the last 10 years: No If all of the above answers are "NO", then may proceed with Cephalosporin use.   Bee Venom Swelling   Influenza Vaccines Hives   Inh [isoniazid] Hives   Kenalog [triamcinolone Acetonide] Hives   Levaquin [levofloxacin] Other (See Comments)   Tendon, ligament pain.    Nalfon [fenoprofen Calcium] Hives   Naproxen    Peanut Oil    Nsaids Rash   Nalfon 600 Nalfon 600      Medication List    STOP taking these medications   amLODipine 5 MG tablet Commonly known as:  NORVASC   losartan 25 MG tablet Commonly known as:  COZAAR     TAKE these medications   acetaminophen 500 MG tablet Commonly known as:  TYLENOL Take 1,000 mg by mouth every 6 (six) hours as needed for mild pain, moderate pain or headache.   allopurinol 100 MG tablet Commonly known as:  ZYLOPRIM Take 200 mg by mouth 2 (two) times daily.   aspirin 81 MG chewable tablet Commonly known as:  ASPIRIN CHILDRENS Chew 1 tablet (81 mg total) by mouth daily.   budesonide-formoterol 160-4.5 MCG/ACT inhaler Commonly known as:  SYMBICORT Inhale 2 puffs into the lungs 2 (two) times daily.   cetirizine 5 MG tablet Commonly known as:  ZYRTEC Take 5 mg by mouth daily as needed for allergies or rhinitis.   citalopram 10 MG tablet Commonly known as:  CELEXA Take 10 mg by mouth daily.   clopidogrel 75 MG tablet Commonly known as:  PLAVIX Take 1 tablet (75 mg total) by mouth daily.   colchicine 0.6 MG tablet Take 1 tablet (0.6 mg total) by mouth daily. What changed:    how much to take  when to take this  reasons to take this   dextromethorphan-guaiFENesin 30-600 MG 12hr tablet Commonly known as:  MUCINEX DM Take 1 tablet by mouth 2 (two) times daily as needed for cough.   EPINEPHrine 0.3 mg/0.3 mL Soaj injection Commonly known as:  EPI-PEN Inject into the muscle as needed (anaphylaxis).   Fish Oil 1000 MG Caps Take 1 capsule by mouth daily.     fluticasone 50 MCG/ACT nasal spray Commonly known as:  FLONASE Place 2 sprays into both nostrils daily.   furosemide 20 MG tablet Commonly known as:  LASIX Take 2 tablets (40 mg total) by mouth daily.   insulin glargine 100 UNIT/ML injection Commonly known as:  LANTUS Inject 62 Units into the skin at bedtime.   ipratropium-albuterol 0.5-2.5 (3) MG/3ML Soln Commonly known as:  DUONEB Take 3 mLs by nebulization every 4 (four) hours as needed.   isosorbide mononitrate 30 MG 24 hr tablet Commonly known as:  IMDUR Take 1 tablet (30 mg total) by mouth 2 (two) times daily. What changed:    medication strength  how much to take  when to take this   magnesium gluconate 500 MG tablet Commonly known as:  MAGONATE Take 500 mg by mouth 2 (two) times daily.   methocarbamol 500 MG tablet Commonly known as:  ROBAXIN Take 500 mg by mouth 2 (two) times daily.   metoprolol tartrate 50 MG tablet Commonly known as:  LOPRESSOR Take 75 mg by mouth 2 (two) times daily. What changed:  Another medication with the same name was removed. Continue taking this medication, and follow the directions you see here.   nitroGLYCERIN 0.4 MG SL tablet Commonly known as:  NITROSTAT Place 0.4 mg under the tongue every 5 (five) minutes x 3 doses as needed for chest pain.   NOVOLOG FLEXPEN 100 UNIT/ML FlexPen Generic drug:  insulin aspart Inject 14-24 Units into the skin See admin instructions. Inject 14u under the skin at breakfast, 24u at lunch and 24u under the skin at dinnertime according to sliding scale   pantoprazole 40 MG tablet Commonly known as:  PROTONIX Take 40 mg by mouth 2 (two) times daily.   ranolazine 500 MG 12 hr tablet Commonly known as:  RANEXA Take 1 tablet (500 mg total) by mouth 2 (two) times daily.   rosuvastatin 40 MG tablet Commonly known as:  CRESTOR Take 40 mg by mouth every evening.   traMADol 50 MG tablet Commonly known as:  ULTRAM Take 50 mg by mouth every 4  (four) hours as needed for moderate pain.   Vitamin D (Ergocalciferol) 1.25 MG (50000 UT) Caps capsule Commonly known as:  DRISDOL Take 50,000 Units by mouth every 30 (thirty) days.        DISCHARGE INSTRUCTIONS:  See AVS.  If you experience worsening of your admission symptoms, develop shortness of breath, life threatening emergency, suicidal or homicidal thoughts you must seek medical attention immediately by calling 911 or calling your MD immediately  if symptoms less severe.  You Must read complete instructions/literature along with all  the possible adverse reactions/side effects for all the Medicines you take and that have been prescribed to you. Take any new Medicines after you have completely understood and accpet all the possible adverse reactions/side effects.   Please note  You were cared for by a hospitalist during your hospital stay. If you have any questions about your discharge medications or the care you received while you were in the hospital after you are discharged, you can call the unit and asked to speak with the hospitalist on call if the hospitalist that took care of you is not available. Once you are discharged, your primary care physician will handle any further medical issues. Please note that NO REFILLS for any discharge medications will be authorized once you are discharged, as it is imperative that you return to your primary care physician (or establish a relationship with a primary care physician if you do not have one) for your aftercare needs so that they can reassess your need for medications and monitor your lab values.    On the day of Discharge:  VITAL SIGNS:  Blood pressure (!) 122/53, pulse 74, temperature 98.4 F (36.9 C), temperature source Oral, resp. rate 18, height 5\' 6"  (1.676 m), weight 86.6 kg, SpO2 97 %. PHYSICAL EXAMINATION:  GENERAL:  77 y.o.-year-old patient lying in the bed with no acute distress.  EYES: Pupils equal, round, reactive to  light and accommodation. No scleral icterus. Extraocular muscles intact.  HEENT: Head atraumatic, normocephalic. Oropharynx and nasopharynx clear.  NECK:  Supple, no jugular venous distention. No thyroid enlargement, no tenderness.  LUNGS: Normal breath sounds bilaterally, no wheezing, rales,rhonchi or crepitation. No use of accessory muscles of respiration.  CARDIOVASCULAR: S1, S2 normal. No murmurs, rubs, or gallops.  ABDOMEN: Soft, non-tender, non-distended. Bowel sounds present. No organomegaly or mass.  EXTREMITIES: No pedal edema, cyanosis, or clubbing.  NEUROLOGIC: Cranial nerves II through XII are intact. Muscle strength 4/5 in all extremities. Sensation intact. Gait not checked.  PSYCHIATRIC: The patient is alert and oriented x 3.  SKIN: No obvious rash, lesion, or ulcer.  DATA REVIEW:   CBC Recent Labs  Lab 12/09/18 0316  12/14/18 0623 12/15/18 0451  WBC 8.4  --   --   --   HGB 7.6*   < > 11.2* 10.8*  HCT 25.2*  --  35.7*  --   PLT 237  --   --   --    < > = values in this interval not displayed.    Chemistries  Recent Labs  Lab 12/11/18 0435  12/15/18 0451  NA 135   < > 135  K 4.3   < > 4.3  CL 108   < > 107  CO2 19*   < > 20*  GLUCOSE 64*   < > 184*  BUN 44*   < > 28*  CREATININE 2.40*   < > 1.66*  CALCIUM 8.5*   < > 8.3*  MG 2.4  --   --    < > = values in this interval not displayed.     Microbiology Results  Results for orders placed or performed during the hospital encounter of 05/06/17  Blood Culture (routine x 2)     Status: None   Collection Time: 05/06/17  7:02 PM  Result Value Ref Range Status   Specimen Description BLOOD RIGHT HAND  Final   Special Requests   Final    BOTTLES DRAWN AEROBIC AND ANAEROBIC Blood Culture adequate volume  Culture NO GROWTH 5 DAYS  Final   Report Status 05/11/2017 FINAL  Final  Blood Culture (routine x 2)     Status: None   Collection Time: 05/06/17  7:02 PM  Result Value Ref Range Status   Specimen Description  BLOOD LEFT AC  Final   Special Requests   Final    BOTTLES DRAWN AEROBIC AND ANAEROBIC Blood Culture adequate volume   Culture NO GROWTH 5 DAYS  Final   Report Status 05/11/2017 FINAL  Final  Urine culture     Status: None   Collection Time: 05/06/17  7:18 PM  Result Value Ref Range Status   Specimen Description URINE, RANDOM  Final   Special Requests NONE  Final   Culture   Final    NO GROWTH Performed at [facility], [place] [address] [place], [place] [address]    Report Status 05/08/2017 FINAL  Final  Gastrointestinal Panel by PCR , Stool     Status: Abnormal   Collection Time: 05/07/17  4:44 AM  Result Value Ref Range Status   Campylobacter species NOT DETECTED NOT DETECTED Final   Plesimonas shigelloides NOT DETECTED NOT DETECTED Final   Salmonella species DETECTED (A) NOT DETECTED Final    Comment: RESULT CALLED TO, READ BACK BY AND VERIFIED WITH: BETH BUONO 05/07/17 0642 ALV    Yersinia enterocolitica NOT DETECTED NOT DETECTED Final   Vibrio species NOT DETECTED NOT DETECTED Final   Vibrio cholerae NOT DETECTED NOT DETECTED Final   Enteroaggregative E coli (EAEC) NOT DETECTED NOT DETECTED Final   Enteropathogenic E coli (EPEC) NOT DETECTED NOT DETECTED Final   Enterotoxigenic E coli (ETEC) NOT DETECTED NOT DETECTED Final   Shiga like toxin producing E coli (STEC) NOT DETECTED NOT DETECTED Final   E. coli O157 NOT DETECTED NOT DETECTED Final   Shigella/Enteroinvasive E coli (EIEC) NOT DETECTED NOT DETECTED Final   Cryptosporidium NOT DETECTED NOT DETECTED Final   Cyclospora cayetanensis NOT DETECTED NOT DETECTED Final   Entamoeba histolytica NOT DETECTED NOT DETECTED Final   Giardia lamblia NOT DETECTED NOT DETECTED Final   Adenovirus F40/41 NOT DETECTED NOT DETECTED Final   Astrovirus NOT DETECTED NOT DETECTED Final   Norovirus GI/GII NOT DETECTED NOT DETECTED Final   Rotavirus A NOT DETECTED NOT DETECTED Final   Sapovirus (I, II, IV, and V) NOT DETECTED NOT  DETECTED Final  C difficile quick scan w PCR reflex     Status: None   Collection Time: 05/07/17  4:44 AM  Result Value Ref Range Status   C Diff antigen NEGATIVE NEGATIVE Final   C Diff toxin NEGATIVE NEGATIVE Final   C Diff interpretation No C. difficile detected.  Final  MRSA PCR Screening     Status: None   Collection Time: 05/07/17  6:24 AM  Result Value Ref Range Status   MRSA by PCR NEGATIVE NEGATIVE Final    Comment:        The GeneXpert MRSA Assay (FDA approved for NASAL specimens only), is one component of a comprehensive MRSA colonization surveillance program. It is not intended to diagnose MRSA infection nor to guide or monitor treatment for MRSA infections.     RADIOLOGY:  No results found.   Management plans discussed with the patient, family and they are in agreement.  CODE STATUS: Full Code   TOTAL TIME TAKING CARE OF THIS PATIENT: 45 minutes.    Demetrios Loll M.D on 12/15/2018 at 12:17 PM  Between 7am to 6pm -  Pager - [phone]  After 6pm go to [email or web address] - Proofreader  [facility]  Office  [phone]  CC: Primary care physician; Cletis Athens, MD   Note: This dictation was prepared with Dragon dictation along with smaller phrase technology. Any transcriptional errors that result from this process are unintentional.

## 2018-12-15 NOTE — Care Management Important Message (Signed)
Copy of signed Medicare IM left with patient in room. 

## 2018-12-16 ENCOUNTER — Inpatient Hospital Stay: Payer: Medicare Other | Admitting: Oncology

## 2018-12-16 LAB — ANCA TITERS
Atypical P-ANCA titer: <1:20 {titer}
C-ANCA: <1:20 {titer}
P-ANCA: <1:20 {titer}

## 2018-12-17 ENCOUNTER — Encounter: Payer: Self-pay | Admitting: Gastroenterology

## 2018-12-17 ENCOUNTER — Other Ambulatory Visit: Payer: Self-pay | Admitting: *Deleted

## 2018-12-17 LAB — SURGICAL PATHOLOGY

## 2018-12-17 NOTE — Patient Outreach (Signed)
[name]) Care Management  12/17/2018  Mike Decker February 09, 1941 [id number]   Initial telephone outreach   Referral received 12/11/18 Referral source: [facility] Case Manager Referral reason : Inpatient admission 12/27-12/15/18  Discharge Diagnosis:  MI , Heart failure,Symptomatic Anemia, polyp of sigmoid colon.    PMHX : chart reviewed , includes Diabetes, hypertension, Chronic Diastolic heart failure , Sleep Apnea,   Outreach call to patient,  His wife Mike Decker answered phone, HIPAA verified, explained reason for the call, she discussed patient is not available at this time she will speak with him and about program and requested that I return call later   1130 Return call to patient , spoke again with his wife she states that patient does not feel like talking and she states that she has spoken with patient regarding [facility] care management . Again, explained [facility] care management team services including members of RN , social worker , pharmacist, to help with managing chronic medical conditions. Discussed that patient can be managed by telephone visits as well and  home visit as needed.  Patient wife discussed that they have all medications prescribed, , patient has his walker at home , they have multiple  appointments set up, home health to visit,  she discussed being followed by heart failure clinic. She discussed they have been through this before and has everything in place. She reports that patient has scales at home for monitoring daily weights. She denies any increase in swelling or shortness of breath or chest pain.  She discussed too many people being involved can be a bit overwhelming.  Patient wife denies cost concern related to medications . Wife reports being able to provide transportation to appointments.   Consent  Patient eligible for services but feels that they are managing well at home. Explained that I will send a outreach letter with [facility] contact information if  new concerns arise .    Plan  Will send successful outreach letter.  Will mark case as not active, patient declined services at this time.    Joylene Draft, RN, [place] Management Coordinator  [phone]- Mobile [phone]- Toll Free Main Office

## 2018-12-22 ENCOUNTER — Encounter: Payer: Self-pay | Admitting: Internal Medicine

## 2018-12-22 ENCOUNTER — Ambulatory Visit (INDEPENDENT_AMBULATORY_CARE_PROVIDER_SITE_OTHER): Payer: Medicare Other | Admitting: Internal Medicine

## 2018-12-22 ENCOUNTER — Ambulatory Visit: Payer: Medicare Other | Admitting: Family

## 2018-12-22 VITALS — BP 120/62 | HR 58 | Resp 16 | Ht 66.0 in | Wt 191.0 lb

## 2018-12-22 DIAGNOSIS — I214 Non-ST elevation (NSTEMI) myocardial infarction: Secondary | ICD-10-CM

## 2018-12-22 DIAGNOSIS — D500 Iron deficiency anemia secondary to blood loss (chronic): Secondary | ICD-10-CM | POA: Diagnosis not present

## 2018-12-22 DIAGNOSIS — R042 Hemoptysis: Secondary | ICD-10-CM

## 2018-12-22 DIAGNOSIS — N184 Chronic kidney disease, stage 4 (severe): Secondary | ICD-10-CM

## 2018-12-22 NOTE — Progress Notes (Signed)
[facility] [place],  [address]  Pulmonary Sleep Medicine   Office Visit Note  Patient Name: Mike Decker DOB: 1941-08-04 MRN [id number]  Date of Service: 12/22/2018  Complaints/HPI: follow up from hospital.  Patient had been in the hospital with consultation requested by me for hemoptysis.  He is on blood thinner which was likely contributing to his hemoptysis he is now since then done better.  He is also had some anemia which was felt to be due to blood loss anemia.  Patient has had a endoscopy done.  He is scheduled for follow-up with GI.  As far as his sleep apnea is concerned he has been using his CPAP device and has been doing fine with the current pressures.  Breathing has been under better control he continues to use his medications as prescribed last pulmonary functions were showing a normal spirometry  ROS  General: (-) fever, (-) chills, (-) night sweats, (-) weakness Skin: (-) rashes, (-) itching,. Eyes: (-) visual changes, (-) redness, (-) itching. Nose and Sinuses: (-) nasal stuffiness or itchiness, (-) postnasal drip, (-) nosebleeds, (-) sinus trouble. Mouth and Throat: (-) sore throat, (-) hoarseness. Neck: (-) swollen glands, (-) enlarged thyroid, (-) neck pain. Respiratory: - cough, (-) bloody sputum, + shortness of breath, - wheezing. Cardiovascular: - ankle swelling, (-) chest pain. Lymphatic: (-) lymph node enlargement. Neurologic: (-) numbness, (-) tingling. Psychiatric: (-) anxiety, (-) depression   Current Medication: Outpatient Encounter Medications as of 12/22/2018  Medication Sig  . acetaminophen (TYLENOL) 500 MG tablet Take 1,000 mg by mouth every 6 (six) hours as needed for mild pain, moderate pain or headache.  . allopurinol (ZYLOPRIM) 100 MG tablet Take 200 mg by mouth 2 (two) times daily.   Marland Kitchen aspirin (ASPIRIN CHILDRENS) 81 MG chewable tablet Chew 1 tablet (81 mg total) by mouth daily.  . budesonide-formoterol  (SYMBICORT) 160-4.5 MCG/ACT inhaler Inhale 2 puffs into the lungs 2 (two) times daily.  . cetirizine (ZYRTEC) 5 MG tablet Take 5 mg by mouth daily as needed for allergies or rhinitis.   . citalopram (CELEXA) 10 MG tablet Take 10 mg by mouth daily.  . clopidogrel (PLAVIX) 75 MG tablet Take 1 tablet (75 mg total) by mouth daily.  . colchicine 0.6 MG tablet Take 1 tablet (0.6 mg total) by mouth daily. (Patient taking differently: Take 1.8 mg by mouth daily as needed (gout flares). )  . dextromethorphan-guaiFENesin (MUCINEX DM) 30-600 MG 12hr tablet Take 1 tablet by mouth 2 (two) times daily as needed for cough.  . EPINEPHrine (EPI-PEN) 0.3 mg/0.3 mL SOAJ injection Inject into the muscle as needed (anaphylaxis).   . fluticasone (FLONASE) 50 MCG/ACT nasal spray Place 2 sprays into both nostrils daily.   . furosemide (LASIX) 20 MG tablet Take 2 tablets (40 mg total) by mouth daily.  . insulin aspart (NOVOLOG FLEXPEN) 100 UNIT/ML FlexPen Inject 14-24 Units into the skin See admin instructions. Inject 14u under the skin at breakfast, 24u at lunch and 24u under the skin at dinnertime according to sliding scale  . insulin glargine (LANTUS) 100 UNIT/ML injection Inject 62 Units into the skin at bedtime.   . isosorbide mononitrate (IMDUR) 30 MG 24 hr tablet Take 1 tablet (30 mg total) by mouth 2 (two) times daily.  . magnesium gluconate (MAGONATE) 500 MG tablet Take 500 mg by mouth 2 (two) times daily.   . methocarbamol (ROBAXIN) 500 MG tablet Take 500 mg by mouth 2 (two) times daily.   Marland Kitchen  metoprolol tartrate (LOPRESSOR) 50 MG tablet Take 75 mg by mouth 2 (two) times daily.   . nitroGLYCERIN (NITROSTAT) 0.4 MG SL tablet Place 0.4 mg under the tongue every 5 (five) minutes x 3 doses as needed for chest pain.   . Omega-3 Fatty Acids (FISH OIL) 1000 MG CAPS Take 1 capsule by mouth daily.   . pantoprazole (PROTONIX) 40 MG tablet Take 40 mg by mouth 2 (two) times daily.  . ranolazine (RANEXA) 500 MG 12 hr tablet  Take 1 tablet (500 mg total) by mouth 2 (two) times daily.  . rosuvastatin (CRESTOR) 40 MG tablet Take 40 mg by mouth every evening.   . traMADol (ULTRAM) 50 MG tablet Take 50 mg by mouth every 4 (four) hours as needed for moderate pain.   . Vitamin D, Ergocalciferol, (DRISDOL) 50000 units CAPS capsule Take 50,000 Units by mouth every 30 (thirty) days.   Marland Kitchen ipratropium-albuterol (DUONEB) 0.5-2.5 (3) MG/3ML SOLN Take 3 mLs by nebulization every 4 (four) hours as needed. (Patient not taking: Reported on 12/22/2018)   No facility-administered encounter medications on file as of 12/22/2018.     Surgical History: Past Surgical History:  Procedure Laterality Date  . CATARACT EXTRACTION  2011, 2012  . COLONOSCOPY N/A 12/14/2018   Procedure: COLONOSCOPY;  Surgeon: Virgel Manifold, MD;  Location: [facility] ENDOSCOPY;  Service: Endoscopy;  Laterality: N/A;  . CORONARY ANGIOPLASTY WITH STENT PLACEMENT  2012  . ESOPHAGOGASTRODUODENOSCOPY N/A 12/14/2018   Procedure: ESOPHAGOGASTRODUODENOSCOPY (EGD);  Surgeon: Virgel Manifold, MD;  Location: [facility] ENDOSCOPY;  Service: Endoscopy;  Laterality: N/A;  . ESOPHAGOGASTRODUODENOSCOPY (EGD) WITH PROPOFOL N/A 06/01/2016   Procedure: ESOPHAGOGASTRODUODENOSCOPY (EGD) WITH PROPOFOL;  Surgeon: Manya Silvas, MD;  Location: [facility] ENDOSCOPY;  Service: Endoscopy;  Laterality: N/A;  . HERNIA REPAIR     x2  . INTRAOCULAR LENS INSERTION    . LEFT HEART CATH AND CORONARY ANGIOGRAPHY N/A 05/09/2017   Procedure: Left Heart Cath and Coronary Angiography;  Surgeon: Yolonda Kida, MD;  Location: [place] CV LAB;  Service: Cardiovascular;  Laterality: N/A;  . LEFT HEART CATH AND CORONARY ANGIOGRAPHY N/A 12/08/2018   Procedure: LEFT HEART CATH AND CORONARY ANGIOGRAPHY and possible PCI and stent;  Surgeon: Yolonda Kida, MD;  Location: [place] CV LAB;  Service: Cardiovascular;  Laterality: N/A;  . NOSE SURGERY     submucous resection  . PROSTATE SURGERY  2002   . ROTATOR CUFF REPAIR Right     Medical History: Past Medical History:  Diagnosis Date  . Arthritis   . Atrioventricular canal (AVC)    irregular heart beats  . Barrett esophagus   . Cancer [facility]) 2002   prostate  . Chronic diastolic CHF (congestive heart failure) ([place])   . Colon polyp   . Diabetes mellitus without complication ([place])   . Diverticulosis   . Gout   . Heart disease   . Hemangioma    liver  . Hyperlipidemia   . Hypertension   . Myocardial infarct ([place])   . Ocular hypertension   . Peripheral vascular disease ([place])   . Skin cancer   . Sleep apnea   . Vitreoretinal degeneration     Family History: Family History  Problem Relation Age of Onset  . Stroke Maternal Grandfather     Social History: Social History   Socioeconomic History  . Marital status: Married    Spouse name: Not on file  . Number of children: Not on file  . Years of education:  Not on file  . Highest education level: Not on file  Occupational History  . Not on file  Social Needs  . Financial resource strain: Not on file  . Food insecurity:    Worry: Not on file    Inability: Not on file  . Transportation needs:    Medical: Not on file    Non-medical: Not on file  Tobacco Use  . Smoking status: Former Smoker    Years: 20.00    Types: Cigarettes    Last attempt to quit: 12/10/1976    Years since quitting: 42.0  . Smokeless tobacco: Never Used  Substance and Sexual Activity  . Alcohol use: No  . Drug use: No  . Sexual activity: Not on file  Lifestyle  . Physical activity:    Days per week: Not on file    Minutes per session: Not on file  . Stress: Not on file  Relationships  . Social connections:    Talks on phone: Not on file    Gets together: Not on file    Attends religious service: Not on file    Active member of club or organization: Not on file    Attends meetings of clubs or organizations: Not on file    Relationship status: Not on file  . Intimate partner  violence:    Fear of current or ex partner: Not on file    Emotionally abused: Not on file    Physically abused: Not on file    Forced sexual activity: Not on file  Other Topics Concern  . Not on file  Social History Narrative  . Not on file    Vital Signs: Blood pressure 120/62, pulse (!) 58, resp. rate 16, height 5\' 6"  (1.676 m), weight 191 lb (86.6 kg), SpO2 98 %.  Examination: General Appearance: The patient is well-developed, well-nourished, and in no distress. Skin: Gross inspection of skin unremarkable. Head: normocephalic, no gross deformities. Eyes: no gross deformities noted. ENT: ears appear grossly normal no exudates. Neck: Supple. No thyromegaly. No LAD. Respiratory: no rhonchi noted at this time. Cardiovascular: Normal S1 and S2 without murmur or rub. Extremities: No cyanosis. pulses are equal. Neurologic: Alert and oriented. No involuntary movements.  LABS: Recent Results (from the past 2160 hour(s))  Pulmonary function test     Status: None   Collection Time: 10/08/18 10:00 AM  Result Value Ref Range   FEV1     FVC     FEV1/FVC     TLC     DLCO    Basic metabolic panel     Status: Abnormal   Collection Time: 11/14/18  9:39 PM  Result Value Ref Range   Sodium 134 (L) 135 - 145 mmol/L   Potassium 5.4 (H) 3.5 - 5.1 mmol/L   Chloride 105 98 - 111 mmol/L   CO2 18 (L) 22 - 32 mmol/L   Glucose, Bld 251 (H) 70 - 99 mg/dL   BUN 44 (H) 8 - 23 mg/dL   Creatinine, Ser 1.96 (H) 0.61 - 1.24 mg/dL   Calcium 9.0 8.9 - 10.3 mg/dL   GFR calc non Af Amer 32 (L) >60 mL/min   GFR calc Af Amer 37 (L) >60 mL/min   Anion gap 11 5 - 15    Comment: Performed at [facility], [address], [place], [place] [address]  CBC     Status: Abnormal   Collection Time: 11/14/18  9:39 PM  Result Value Ref Range  WBC 12.4 (H) 4.0 - 10.5 K/uL   RBC 3.54 (L) 4.22 - 5.81 MIL/uL   Hemoglobin 9.3 (L) 13.0 - 17.0 g/dL   HCT 30.1 (L) 39.0 - 52.0 %   MCV 85.0 80.0 - 100.0 fL    MCH 26.3 26.0 - 34.0 pg   MCHC 30.9 30.0 - 36.0 g/dL   RDW 15.4 11.5 - 15.5 %   Platelets 350 150 - 400 K/uL   nRBC 0.0 0.0 - 0.2 %    Comment: Performed at [facility], [address] [place], [place] [address]  Troponin I - ONCE - STAT     Status: Abnormal   Collection Time: 11/14/18  9:39 PM  Result Value Ref Range   Troponin I 0.05 (HH) <0.03 ng/mL    Comment: CRITICAL RESULT CALLED TO, READ BACK BY AND VERIFIED WITH NOEL WEBSTER 11/14/18 @ [place] Performed at [facility], [address] [place], [place] [address]   Brain natriuretic peptide     Status: Abnormal   Collection Time: 11/14/18  9:39 PM  Result Value Ref Range   B Natriuretic Peptide 231.0 (H) 0.0 - 100.0 pg/mL    Comment: Performed at [facility], [address] [place], [place] [address]  Protime-INR     Status: None   Collection Time: 11/14/18  9:39 PM  Result Value Ref Range   Prothrombin Time 12.9 11.4 - 15.2 seconds   INR 0.98     Comment: Performed at [facility], [address] [place], [place] [address]  APTT     Status: None   Collection Time: 11/14/18  9:39 PM  Result Value Ref Range   aPTT 33 24 - 36 seconds    Comment: Performed at [facility], [address] [place], [place] [address]  Glucose, capillary     Status: Abnormal   Collection Time: 11/15/18 12:50 AM  Result Value Ref Range   Glucose-Capillary 246 (H) 70 - 99 mg/dL  Basic metabolic panel     Status: Abnormal   Collection Time: 11/15/18  4:06 AM  Result Value Ref Range   Sodium 134 (L) 135 - 145 mmol/L   Potassium 5.5 (H) 3.5 - 5.1 mmol/L   Chloride 106 98 - 111 mmol/L   CO2 20 (L) 22 - 32 mmol/L   Glucose, Bld 267 (H) 70 - 99 mg/dL   BUN 46 (H) 8 - 23 mg/dL   Creatinine, Ser 2.00 (H) 0.61 - 1.24 mg/dL   Calcium 8.8 (L) 8.9 - 10.3 mg/dL   GFR calc non Af Amer 31 (L) >60 mL/min   GFR calc Af Amer 36 (L) >60 mL/min   Anion gap 8 5 - 15    Comment: Performed at  [facility], [address] [place], [place] [address]  CBC     Status: Abnormal   Collection Time: 11/15/18  4:06 AM  Result Value Ref Range   WBC 11.3 (H) 4.0 - 10.5 K/uL   RBC 3.35 (L) 4.22 - 5.81 MIL/uL   Hemoglobin 8.6 (L) 13.0 - 17.0 g/dL   HCT 28.9 (L) 39.0 - 52.0 %   MCV 86.3 80.0 - 100.0 fL   MCH 25.7 (L) 26.0 - 34.0 pg   MCHC 29.8 (L) 30.0 - 36.0 g/dL   RDW 15.4 11.5 - 15.5 %   Platelets 335 150 - 400 K/uL   nRBC 0.0 0.0 - 0.2 %    Comment: Performed at [facility], [place], [place] [address]  Troponin I - Once     Status: Abnormal   Collection Time: 11/15/18  4:06 AM  Result Value Ref Range   Troponin I 3.65 (HH) <0.03 ng/mL    Comment: CRITICAL RESULT CALLED TO, READ BACK BY AND VERIFIED WITH MARCEL TURNER AT 0704 11/15/18.PMF Performed at [facility], [address] [place], [place] [address]   Lipid panel     Status: Abnormal   Collection Time: 11/15/18  4:06 AM  Result Value Ref Range   Cholesterol 112 0 - 200 mg/dL   Triglycerides 171 (H) <150 mg/dL   HDL 27 (L) >40 mg/dL   Total CHOL/HDL Ratio 4.1 RATIO   VLDL 34 0 - 40 mg/dL   LDL Cholesterol 51 0 - 99 mg/dL    Comment:        Total Cholesterol/HDL:CHD Risk Coronary Heart Disease Risk Table                     Men   Women  1/2 Average Risk   3.4   3.3  Average Risk       5.0   4.4  2 X Average Risk   9.6   7.1  3 X Average Risk  23.4   11.0        Use the calculated Patient Ratio above and the CHD Risk Table to determine the patient's CHD Risk.        ATP III CLASSIFICATION (LDL):  <100     mg/dL   Optimal  100-129  mg/dL   Near or Above                    Optimal  130-159  mg/dL   Borderline  160-189  mg/dL   High  >190     mg/dL   Very High Performed at [facility], [address] [place], [place] [address]   Hemoglobin A1c     Status: Abnormal   Collection Time: 11/15/18  4:06 AM  Result Value Ref Range   Hgb A1c MFr Bld 7.2 (H)  4.8 - 5.6 %    Comment: (NOTE) Pre diabetes:          5.7%-6.4% Diabetes:              >6.4% Glycemic control for   <7.0% adults with diabetes    Mean Plasma Glucose 159.94 mg/dL    Comment: Performed at [facility] [address] [place], [place] [address]  Glucose, capillary     Status: Abnormal   Collection Time: 11/15/18  8:19 AM  Result Value Ref Range   Glucose-Capillary 233 (H) 70 - 99 mg/dL  ECHOCARDIOGRAM COMPLETE     Status: None   Collection Time: 11/15/18 12:31 PM  Result Value Ref Range   Weight 3,158.4 oz   Height 66 in   BP 166/61 mmHg  Glucose, capillary     Status: Abnormal   Collection Time: 11/15/18  1:00 PM  Result Value Ref Range   Glucose-Capillary 210 (H) 70 - 99 mg/dL  Troponin I - Now Then Q6H     Status: Abnormal   Collection Time: 11/15/18  1:13 PM  Result Value Ref Range   Troponin I 6.06 (HH) <0.03 ng/mL    Comment: CRITICAL VALUE NOTED. VALUE IS CONSISTENT WITH PREVIOUSLY REPORTED/CALLED VALUE.PMF Performed at [facility], [address] [place], [place] [address]  Glucose, capillary     Status: Abnormal   Collection Time: 11/15/18  4:59 PM  Result Value Ref Range   Glucose-Capillary 210 (H) 70 - 99 mg/dL   Comment 1 Notify RN   Troponin I - Now Then Q6H     Status: Abnormal   Collection Time: 11/15/18  6:33 PM  Result Value Ref Range   Troponin I 5.11 (HH) <0.03 ng/mL    Comment: CRITICAL VALUE NOTED. VALUE IS CONSISTENT WITH PREVIOUSLY REPORTED/CALLED VALUE TTG Performed at [facility], [place], [place] [address]   Heparin level (unfractionated)     Status: None   Collection Time: 11/15/18  7:04 PM  Result Value Ref Range   Heparin Unfractionated 0.47 0.30 - 0.70 IU/mL    Comment: (NOTE) If heparin results are below expected values, and patient dosage has  been confirmed, suggest follow up testing of antithrombin III levels. Performed at [facility], [address]  [place], [place] [address]   Glucose, capillary     Status: Abnormal   Collection Time: 11/15/18  8:56 PM  Result Value Ref Range   Glucose-Capillary 198 (H) 70 - 99 mg/dL  Troponin I - Now Then Q6H     Status: Abnormal   Collection Time: 11/16/18 12:10 AM  Result Value Ref Range   Troponin I 3.42 (HH) <0.03 ng/mL    Comment: CRITICAL VALUE NOTED. VALUE IS CONSISTENT WITH PREVIOUSLY REPORTED/CALLED VALUE RWW Performed at [facility], [address] [place], [place] [address]   Basic metabolic panel     Status: Abnormal   Collection Time: 11/16/18  4:19 AM  Result Value Ref Range   Sodium 139 135 - 145 mmol/L   Potassium 4.5 3.5 - 5.1 mmol/L   Chloride 105 98 - 111 mmol/L   CO2 24 22 - 32 mmol/L   Glucose, Bld 202 (H) 70 - 99 mg/dL   BUN 61 (H) 8 - 23 mg/dL   Creatinine, Ser 2.45 (H) 0.61 - 1.24 mg/dL   Calcium 8.6 (L) 8.9 - 10.3 mg/dL   GFR calc non Af Amer 24 (L) >60 mL/min   GFR calc Af Amer 28 (L) >60 mL/min   Anion gap 10 5 - 15    Comment: Performed at [facility], [address] [place], [place] [address]  CBC     Status: Abnormal   Collection Time: 11/16/18  4:19 AM  Result Value Ref Range   WBC 8.9 4.0 - 10.5 K/uL   RBC 3.25 (L) 4.22 - 5.81 MIL/uL   Hemoglobin 8.4 (L) 13.0 - 17.0 g/dL   HCT 27.4 (L) 39.0 - 52.0 %   MCV 84.3 80.0 - 100.0 fL   MCH 25.8 (L) 26.0 - 34.0 pg   MCHC 30.7 30.0 - 36.0 g/dL   RDW 15.8 (H) 11.5 - 15.5 %   Platelets 320 150 - 400 K/uL   nRBC 0.0 0.0 - 0.2 %    Comment: Performed at [facility], [place], [place] [address]  Heparin level (unfractionated)     Status: None   Collection Time: 11/16/18  4:19 AM  Result Value Ref Range   Heparin Unfractionated 0.40 0.30 - 0.70 IU/mL    Comment: (NOTE) If heparin results are below expected values, and patient dosage has  been confirmed, suggest follow up testing of antithrombin III levels. Performed at [facility], [address]  [place], [place] [address]   Glucose, capillary  Status: Abnormal   Collection Time: 11/16/18  7:38 AM  Result Value Ref Range   Glucose-Capillary 207 (H) 70 - 99 mg/dL   Comment 1 Notify RN   Glucose, capillary     Status: Abnormal   Collection Time: 11/16/18 11:47 AM  Result Value Ref Range   Glucose-Capillary 226 (H) 70 - 99 mg/dL   Comment 1 Notify RN   Glucose, capillary     Status: Abnormal   Collection Time: 11/16/18  5:41 PM  Result Value Ref Range   Glucose-Capillary 246 (H) 70 - 99 mg/dL  Glucose, capillary     Status: Abnormal   Collection Time: 11/16/18  9:50 PM  Result Value Ref Range   Glucose-Capillary 212 (H) 70 - 99 mg/dL   Comment 1 Notify RN   Basic metabolic panel     Status: Abnormal   Collection Time: 11/17/18  3:43 AM  Result Value Ref Range   Sodium 136 135 - 145 mmol/L   Potassium 4.2 3.5 - 5.1 mmol/L   Chloride 104 98 - 111 mmol/L   CO2 23 22 - 32 mmol/L   Glucose, Bld 219 (H) 70 - 99 mg/dL   BUN 65 (H) 8 - 23 mg/dL   Creatinine, Ser 2.35 (H) 0.61 - 1.24 mg/dL   Calcium 8.4 (L) 8.9 - 10.3 mg/dL   GFR calc non Af Amer 26 (L) >60 mL/min   GFR calc Af Amer 30 (L) >60 mL/min   Anion gap 9 5 - 15    Comment: Performed at [facility], [address] [place], [place] [address]  CBC     Status: Abnormal   Collection Time: 11/17/18  3:43 AM  Result Value Ref Range   WBC 9.4 4.0 - 10.5 K/uL   RBC 3.19 (L) 4.22 - 5.81 MIL/uL   Hemoglobin 8.2 (L) 13.0 - 17.0 g/dL   HCT 26.8 (L) 39.0 - 52.0 %   MCV 84.0 80.0 - 100.0 fL   MCH 25.7 (L) 26.0 - 34.0 pg   MCHC 30.6 30.0 - 36.0 g/dL   RDW 15.3 11.5 - 15.5 %   Platelets 308 150 - 400 K/uL   nRBC 0.0 0.0 - 0.2 %    Comment: Performed at [facility], [address] [place], [place] [address]  Glucose, capillary     Status: Abnormal   Collection Time: 11/17/18  7:59 AM  Result Value Ref Range   Glucose-Capillary 204 (H) 70 - 99 mg/dL  Glucose, capillary     Status: Abnormal   Collection  Time: 11/17/18 11:58 AM  Result Value Ref Range   Glucose-Capillary 230 (H) 70 - 99 mg/dL  Basic metabolic panel     Status: Abnormal   Collection Time: 12/05/18 10:36 AM  Result Value Ref Range   Sodium 138 135 - 145 mmol/L   Potassium 4.3 3.5 - 5.1 mmol/L   Chloride 106 98 - 111 mmol/L   CO2 22 22 - 32 mmol/L   Glucose, Bld 78 70 - 99 mg/dL   BUN 47 (H) 8 - 23 mg/dL   Creatinine, Ser 2.11 (H) 0.61 - 1.24 mg/dL   Calcium 9.7 8.9 - 10.3 mg/dL   GFR calc non Af Amer 29 (L) >60 mL/min   GFR calc Af Amer 34 (L) >60 mL/min   Anion gap 10 5 - 15    Comment: Performed at [facility], [address], [place], [place] [address]  CBC     Status:  Abnormal   Collection Time: 12/05/18 10:36 AM  Result Value Ref Range   WBC 7.3 4.0 - 10.5 K/uL   RBC 3.79 (L) 4.22 - 5.81 MIL/uL   Hemoglobin 9.4 (L) 13.0 - 17.0 g/dL   HCT 30.9 (L) 39.0 - 52.0 %   MCV 81.5 80.0 - 100.0 fL   MCH 24.8 (L) 26.0 - 34.0 pg   MCHC 30.4 30.0 - 36.0 g/dL   RDW 15.4 11.5 - 15.5 %   Platelets 317 150 - 400 K/uL   nRBC 0.0 0.0 - 0.2 %    Comment: Performed at [facility], [address] [place], [place] [address]  Troponin I - ONCE - STAT     Status: None   Collection Time: 12/05/18 10:36 AM  Result Value Ref Range   Troponin I <0.03 <0.03 ng/mL    Comment: Performed at [facility], [address] [place], [place] [address]  Protime-INR (order if Patient is taking Coumadin / Warfarin)     Status: None   Collection Time: 12/05/18 10:36 AM  Result Value Ref Range   Prothrombin Time 12.9 11.4 - 15.2 seconds   INR 0.98     Comment: Performed at [facility], [address] [place], [place] [address]  APTT     Status: None   Collection Time: 12/05/18 10:36 AM  Result Value Ref Range   aPTT 32 24 - 36 seconds    Comment: Performed at [facility], [address] [place], [place] [address]  Troponin I - Now Then Q6H     Status: None   Collection Time: 12/05/18   3:51 PM  Result Value Ref Range   Troponin I <0.03 <0.03 ng/mL    Comment: Performed at [facility], [address] [place], [place] [address]  Glucose, capillary     Status: Abnormal   Collection Time: 12/05/18  4:50 PM  Result Value Ref Range   Glucose-Capillary 134 (H) 70 - 99 mg/dL  Glucose, capillary     Status: Abnormal   Collection Time: 12/05/18  8:44 PM  Result Value Ref Range   Glucose-Capillary 189 (H) 70 - 99 mg/dL  Heparin level (unfractionated)     Status: None   Collection Time: 12/05/18  8:58 PM  Result Value Ref Range   Heparin Unfractionated 0.46 0.30 - 0.70 IU/mL    Comment: (NOTE) If heparin results are below expected values, and patient dosage has  been confirmed, suggest follow up testing of antithrombin III levels. Performed at [facility], [address] [place], [place] [address]   Troponin I - Now Then Q6H     Status: None   Collection Time: 12/05/18  8:58 PM  Result Value Ref Range   Troponin I <0.03 <0.03 ng/mL    Comment: Performed at [facility], [address] [place],  [address]  Troponin I - Now Then Q6H     Status: None   Collection Time: 12/06/18  3:36 AM  Result Value Ref Range   Troponin I <0.03 <0.03 ng/mL    Comment: Performed at [facility], [address] [place],  [address]  Basic metabolic panel     Status: Abnormal   Collection Time: 12/06/18  3:36 AM  Result Value Ref Range   Sodium 135 135 - 145 mmol/L   Potassium 4.5 3.5 - 5.1 mmol/L   Chloride 103 98 - 111 mmol/L   CO2 22 22 - 32 mmol/L  Glucose, Bld 173 (H) 70 - 99 mg/dL   BUN 49 (H) 8 - 23 mg/dL   Creatinine, Ser 2.28 (H) 0.61 - 1.24 mg/dL   Calcium 9.0 8.9 - 10.3 mg/dL   GFR calc non Af Amer 26 (L) >60 mL/min   GFR calc Af Amer 31 (L) >60 mL/min   Anion gap 10 5 - 15    Comment: Performed at [facility], [address] [place], [place] [address]  CBC     Status: Abnormal   Collection Time:  12/06/18  3:36 AM  Result Value Ref Range   WBC 9.8 4.0 - 10.5 K/uL   RBC 3.43 (L) 4.22 - 5.81 MIL/uL   Hemoglobin 8.5 (L) 13.0 - 17.0 g/dL   HCT 28.3 (L) 39.0 - 52.0 %   MCV 82.5 80.0 - 100.0 fL   MCH 24.8 (L) 26.0 - 34.0 pg   MCHC 30.0 30.0 - 36.0 g/dL   RDW 15.6 (H) 11.5 - 15.5 %   Platelets 313 150 - 400 K/uL   nRBC 0.0 0.0 - 0.2 %    Comment: Performed at [facility], [address] [place], [place] [address]  Heparin level (unfractionated)     Status: None   Collection Time: 12/06/18  3:36 AM  Result Value Ref Range   Heparin Unfractionated 0.42 0.30 - 0.70 IU/mL    Comment: (NOTE) If heparin results are below expected values, and patient dosage has  been confirmed, suggest follow up testing of antithrombin III levels. Performed at [facility], [address] [place], [place] [address]   Glucose, capillary     Status: Abnormal   Collection Time: 12/06/18  7:41 AM  Result Value Ref Range   Glucose-Capillary 163 (H) 70 - 99 mg/dL   Comment 1 Notify RN    Comment 2 Document in Chart   Glucose, capillary     Status: Abnormal   Collection Time: 12/06/18 11:43 AM  Result Value Ref Range   Glucose-Capillary 209 (H) 70 - 99 mg/dL   Comment 1 Notify RN    Comment 2 Document in Chart   Iron and TIBC     Status: Abnormal   Collection Time: 12/06/18  1:24 PM  Result Value Ref Range   Iron 22 (L) 45 - 182 ug/dL   TIBC 383 250 - 450 ug/dL   Saturation Ratios 6 (L) 17.9 - 39.5 %   UIBC 362 ug/dL    Comment: Performed at [facility], [address] [place], [place] [address]  Ferritin     Status: Abnormal   Collection Time: 12/06/18  1:24 PM  Result Value Ref Range   Ferritin 11 (L) 24 - 336 ng/mL    Comment: Performed at [facility], [address] [place], [place] [address]  Vitamin B12     Status: Abnormal   Collection Time: 12/06/18  1:24 PM  Result Value Ref Range   Vitamin B-12 173 (L) 180 - 914 pg/mL    Comment:  (NOTE) This assay is not validated for testing neonatal or myeloproliferative syndrome specimens for Vitamin B12 levels. Performed at [facility], [place] [address], [place], [place] [address]   Reticulocytes     Status: Abnormal   Collection Time: 12/06/18  1:24 PM  Result Value Ref Range   Retic Ct Pct 1.2 0.4 - 3.1 %   RBC. 3.48 (L) 4.22 - 5.81 MIL/uL   Retic Count, Absolute 42.8 19.0 - 186.0  K/uL   Immature Retic Fract 17.7 (H) 2.3 - 15.9 %    Comment: Performed at [facility], [address] [place], [place] [address]  TSH     Status: None   Collection Time: 12/06/18  1:24 PM  Result Value Ref Range   TSH 1.948 0.350 - 4.500 uIU/mL    Comment: Performed by a 3rd Generation assay with a functional sensitivity of <=0.01 uIU/mL. Performed at [facility], [address] [place], [place] [address]   Folate     Status: None   Collection Time: 12/06/18  1:24 PM  Result Value Ref Range   Folate 19.6 >5.9 ng/mL    Comment: Performed at [facility], [address] [place], [place] [address]  Glucose, capillary     Status: Abnormal   Collection Time: 12/06/18  4:27 PM  Result Value Ref Range   Glucose-Capillary 207 (H) 70 - 99 mg/dL   Comment 1 Notify RN    Comment 2 Document in Chart   Glucose, capillary     Status: Abnormal   Collection Time: 12/06/18  9:16 PM  Result Value Ref Range   Glucose-Capillary 180 (H) 70 - 99 mg/dL  CBC     Status: Abnormal   Collection Time: 12/07/18  4:47 AM  Result Value Ref Range   WBC 7.6 4.0 - 10.5 K/uL   RBC 3.52 (L) 4.22 - 5.81 MIL/uL   Hemoglobin 8.7 (L) 13.0 - 17.0 g/dL   HCT 28.8 (L) 39.0 - 52.0 %   MCV 81.8 80.0 - 100.0 fL   MCH 24.7 (L) 26.0 - 34.0 pg   MCHC 30.2 30.0 - 36.0 g/dL   RDW 15.3 11.5 - 15.5 %   Platelets 285 150 - 400 K/uL   nRBC 0.0 0.0 - 0.2 %    Comment: Performed at [facility], [address] [place], [place] [address]  Heparin level (unfractionated)     Status:  None   Collection Time: 12/07/18  4:47 AM  Result Value Ref Range   Heparin Unfractionated 0.39 0.30 - 0.70 IU/mL    Comment: (NOTE) If heparin results are below expected values, and patient dosage has  been confirmed, suggest follow up testing of antithrombin III levels. Performed at [facility], [address] [place], [place] [address]   Glucose, capillary     Status: None   Collection Time: 12/07/18  7:26 AM  Result Value Ref Range   Glucose-Capillary 96 70 - 99 mg/dL  Glucose, capillary     Status: Abnormal   Collection Time: 12/07/18 11:32 AM  Result Value Ref Range   Glucose-Capillary 175 (H) 70 - 99 mg/dL  Glucose, capillary     Status: Abnormal   Collection Time: 12/07/18  4:18 PM  Result Value Ref Range   Glucose-Capillary 214 (H) 70 - 99 mg/dL  Glucose, capillary     Status: Abnormal   Collection Time: 12/07/18  9:13 PM  Result Value Ref Range   Glucose-Capillary 229 (H) 70 - 99 mg/dL   Comment 1 Notify RN    Comment 2 Document in Chart   Heparin level (unfractionated)     Status: Abnormal   Collection Time: 12/08/18  3:28 AM  Result Value Ref Range   Heparin Unfractionated 0.27 (L) 0.30 - 0.70 IU/mL    Comment: (NOTE) If heparin results are below expected values, and patient dosage has  been confirmed, suggest follow up testing of antithrombin III levels. Performed at  [facility], [address] [place], [place] [address]   CBC     Status: Abnormal   Collection Time: 12/08/18  3:28 AM  Result Value Ref Range   WBC 8.4 4.0 - 10.5 K/uL   RBC 3.08 (L) 4.22 - 5.81 MIL/uL   Hemoglobin 7.5 (L) 13.0 - 17.0 g/dL   HCT 25.4 (L) 39.0 - 52.0 %   MCV 82.5 80.0 - 100.0 fL   MCH 24.4 (L) 26.0 - 34.0 pg   MCHC 29.5 (L) 30.0 - 36.0 g/dL   RDW 15.5 11.5 - 15.5 %   Platelets 247 150 - 400 K/uL   nRBC 0.0 0.0 - 0.2 %    Comment: Performed at [facility], [address] [place], [place] [address]  Glucose, capillary     Status: Abnormal    Collection Time: 12/08/18  7:22 AM  Result Value Ref Range   Glucose-Capillary 129 (H) 70 - 99 mg/dL   Comment 1 Notify RN    Comment 2 Document in Chart   Glucose, capillary     Status: Abnormal   Collection Time: 12/08/18 11:37 AM  Result Value Ref Range   Glucose-Capillary 103 (H) 70 - 99 mg/dL   Comment 1 Notify RN    Comment 2 Document in Chart   Heparin level (unfractionated)     Status: None   Collection Time: 12/08/18  1:00 PM  Result Value Ref Range   Heparin Unfractionated 0.55 0.30 - 0.70 IU/mL    Comment: (NOTE) If heparin results are below expected values, and patient dosage has  been confirmed, suggest follow up testing of antithrombin III levels. Performed at [facility], [address] [place], [place] [address]   Glucose, capillary     Status: Abnormal   Collection Time: 12/08/18  4:17 PM  Result Value Ref Range   Glucose-Capillary 65 (L) 70 - 99 mg/dL  Glucose, capillary     Status: Abnormal   Collection Time: 12/08/18  4:19 PM  Result Value Ref Range   Glucose-Capillary 63 (L) 70 - 99 mg/dL  Glucose, capillary     Status: Abnormal   Collection Time: 12/08/18  5:17 PM  Result Value Ref Range   Glucose-Capillary 161 (H) 70 - 99 mg/dL  Glucose, capillary     Status: Abnormal   Collection Time: 12/08/18  6:18 PM  Result Value Ref Range   Glucose-Capillary 152 (H) 70 - 99 mg/dL  Hemoglobin     Status: Abnormal   Collection Time: 12/08/18  9:15 PM  Result Value Ref Range   Hemoglobin 8.4 (L) 13.0 - 17.0 g/dL    Comment: Performed at [facility], [address] [place], [place] [address]  Glucose, capillary     Status: Abnormal   Collection Time: 12/08/18  9:31 PM  Result Value Ref Range   Glucose-Capillary 188 (H) 70 - 99 mg/dL  CBC     Status: Abnormal   Collection Time: 12/09/18  3:16 AM  Result Value Ref Range   WBC 8.4 4.0 - 10.5 K/uL   RBC 3.07 (L) 4.22 - 5.81 MIL/uL   Hemoglobin 7.6 (L) 13.0 - 17.0 g/dL   HCT 25.2 (L)  39.0 - 52.0 %   MCV 82.1 80.0 - 100.0 fL   MCH 24.8 (L) 26.0 - 34.0 pg   MCHC 30.2 30.0 - 36.0 g/dL   RDW 15.6 (H) 11.5 - 15.5 %   Platelets 237 150 - 400 K/uL   nRBC  0.0 0.0 - 0.2 %    Comment: Performed at [facility], [address] [place], [place] [address]  Basic metabolic panel     Status: Abnormal   Collection Time: 12/09/18  3:16 AM  Result Value Ref Range   Sodium 136 135 - 145 mmol/L   Potassium 4.6 3.5 - 5.1 mmol/L   Chloride 110 98 - 111 mmol/L   CO2 20 (L) 22 - 32 mmol/L   Glucose, Bld 151 (H) 70 - 99 mg/dL   BUN 39 (H) 8 - 23 mg/dL   Creatinine, Ser 2.17 (H) 0.61 - 1.24 mg/dL   Calcium 8.2 (L) 8.9 - 10.3 mg/dL   GFR calc non Af Amer 28 (L) >60 mL/min   GFR calc Af Amer 33 (L) >60 mL/min   Anion gap 6 5 - 15    Comment: Performed at [facility], [address] [place], [place] [address]  Glucose, capillary     Status: Abnormal   Collection Time: 12/09/18  4:25 AM  Result Value Ref Range   Glucose-Capillary 148 (H) 70 - 99 mg/dL  Mpo/pr-3 (anca) antibodies     Status: None   Collection Time: 12/09/18  4:57 AM  Result Value Ref Range   Myeloperoxidase Abs <9.0 0.0 - 9.0 U/mL   ANCA Proteinase 3 <3.5 0.0 - 3.5 U/mL    Comment: (NOTE) Performed At: [facility] [address] [place], [place] [id number] Rush Farmer MD [id number]   Glucose, capillary     Status: Abnormal   Collection Time: 12/09/18  7:29 AM  Result Value Ref Range   Glucose-Capillary 130 (H) 70 - 99 mg/dL  Glucose, capillary     Status: Abnormal   Collection Time: 12/09/18 12:14 PM  Result Value Ref Range   Glucose-Capillary 125 (H) 70 - 99 mg/dL  Prepare RBC     Status: None   Collection Time: 12/09/18  3:43 PM  Result Value Ref Range   Order Confirmation      ORDER PROCESSED BY BLOOD BANK Performed at [facility], [address] [place], [place] [address]   Type and screen [place]     Status: None   Collection  Time: 12/09/18  4:19 PM  Result Value Ref Range   ABO/RH(D) A POS    Antibody Screen POS    Sample Expiration 12/12/2018    Antibody Identification ANTI K    Unit Number [id number]    Blood Component Type RED CELLS,LR    Unit division 00    Status of Unit REL FROM [facility]    Transfusion Status OK TO TRANSFUSE    Crossmatch Result      COMPATIBLE Performed at [facility], [address] [place], [place] [address]    Unit Number [id number]    Blood Component Type RBC LR PHER1    Unit division 00    Status of Unit ISSUED,FINAL    Transfusion Status OK TO TRANSFUSE    Crossmatch Result COMPATIBLE    Donor AG Type NEGATIVE FOR KELL ANTIGEN    Unit Number [id number]    Blood Component Type RBC LR PHER2    Unit division 00    Status of Unit REL FROM [facility]    Transfusion Status OK TO TRANSFUSE    Crossmatch Result COMPATIBLE   BPAM RBC     Status: None   Collection Time: 12/09/18  4:19 PM  Result Value Ref Range   Blood Product Unit  Number [id number]    Unit Type and Rh 9371    Blood Product Expiration Date 202001122359    ISSUE DATE / TIME [id number]    Blood Product Unit Number [id number]    PRODUCT CODE [id number]    Unit Type and Rh 3154    Blood Product Expiration Date 008676195093    Blood Product Unit Number [id number]    Unit Type and Rh 6200    Blood Product Expiration Date 382505397673   Glucose, capillary     Status: Abnormal   Collection Time: 12/09/18  4:58 PM  Result Value Ref Range   Glucose-Capillary 141 (H) 70 - 99 mg/dL  Glucose, capillary     Status: Abnormal   Collection Time: 12/09/18  9:00 PM  Result Value Ref Range   Glucose-Capillary 167 (H) 70 - 99 mg/dL  Glucose, capillary     Status: Abnormal   Collection Time: 12/10/18  2:54 AM  Result Value Ref Range   Glucose-Capillary 110 (H) 70 - 99 mg/dL  Hemoglobin     Status: Abnormal   Collection Time: 12/10/18  4:14 AM  Result Value Ref Range   Hemoglobin 8.7 (L) 13.0 -  17.0 g/dL    Comment: Performed at [facility], [address] [place], [place] [address]  Glucose, capillary     Status: None   Collection Time: 12/10/18  8:07 AM  Result Value Ref Range   Glucose-Capillary 78 70 - 99 mg/dL  Fibrin derivatives D-Dimer ([facility] only)     Status: Abnormal   Collection Time: 12/10/18 10:59 AM  Result Value Ref Range   Fibrin derivatives D-dimer ([facility]) 899.13 (H) 0.00 - 499.00 ng/mL (FEU)    Comment: (NOTE) <> Exclusion of Venous Thromboembolism (VTE) - OUTPATIENT ONLY   (Emergency Department or [place])   0-499 ng/ml (FEU): With a low to intermediate pretest probability                      for VTE this test result excludes the diagnosis                      of VTE.   >499 ng/ml (FEU) : VTE not excluded; additional work up for VTE is                      required. <> Testing on Inpatients and Evaluation of Disseminated Intravascular   Coagulation (DIC) Reference Range:   0-499 ng/ml (FEU) Performed at [facility] [address] [place], [place] [address]   Glucose, capillary     Status: Abnormal   Collection Time: 12/10/18 11:37 AM  Result Value Ref Range   Glucose-Capillary 105 (H) 70 - 99 mg/dL  Glucose, capillary     Status: Abnormal   Collection Time: 12/10/18  4:29 PM  Result Value Ref Range   Glucose-Capillary 119 (H) 70 - 99 mg/dL  Brain natriuretic peptide     Status: Abnormal   Collection Time: 12/10/18  6:37 PM  Result Value Ref Range   B Natriuretic Peptide 546.0 (H) 0.0 - 100.0 pg/mL    Comment: Performed at [facility], [address] [place], [place] [address]  Glucose, capillary     Status: Abnormal   Collection Time: 12/10/18  9:09 PM  Result Value Ref Range   Glucose-Capillary 153 (H) 70 - 99 mg/dL   Comment 1 Notify RN   Basic metabolic panel     Status:  Abnormal   Collection Time: 12/11/18  4:35 AM  Result Value Ref Range   Sodium 135 135 - 145 mmol/L   Potassium 4.3 3.5 - 5.1 mmol/L    Chloride 108 98 - 111 mmol/L   CO2 19 (L) 22 - 32 mmol/L   Glucose, Bld 64 (L) 70 - 99 mg/dL   BUN 44 (H) 8 - 23 mg/dL   Creatinine, Ser 2.40 (H) 0.61 - 1.24 mg/dL   Calcium 8.5 (L) 8.9 - 10.3 mg/dL   GFR calc non Af Amer 25 (L) >60 mL/min   GFR calc Af Amer 29 (L) >60 mL/min   Anion gap 8 5 - 15    Comment: Performed at [facility], [address] [place], [place] [address]  Magnesium     Status: None   Collection Time: 12/11/18  4:35 AM  Result Value Ref Range   Magnesium 2.4 1.7 - 2.4 mg/dL    Comment: Performed at [facility], [address] [place], [place] [address]  Hemoglobin     Status: Abnormal   Collection Time: 12/11/18  4:36 AM  Result Value Ref Range   Hemoglobin 8.3 (L) 13.0 - 17.0 g/dL    Comment: Performed at [facility], [address] [place], [place] [address]  Glucose, capillary     Status: Abnormal   Collection Time: 12/11/18  6:02 AM  Result Value Ref Range   Glucose-Capillary 51 (L) 70 - 99 mg/dL   Comment 1 Notify RN   Glucose, capillary     Status: Abnormal   Collection Time: 12/11/18  7:57 AM  Result Value Ref Range   Glucose-Capillary 50 (L) 70 - 99 mg/dL  Glucose, capillary     Status: None   Collection Time: 12/11/18  8:23 AM  Result Value Ref Range   Glucose-Capillary 76 70 - 99 mg/dL  Glucose, capillary     Status: None   Collection Time: 12/11/18 11:58 AM  Result Value Ref Range   Glucose-Capillary 86 70 - 99 mg/dL  Urinalysis, Routine w reflex microscopic     Status: Abnormal   Collection Time: 12/11/18  3:12 PM  Result Value Ref Range   Color, Urine YELLOW (A) YELLOW   APPearance CLEAR (A) CLEAR   Specific Gravity, Urine 1.013 1.005 - 1.030   pH 5.0 5.0 - 8.0   Glucose, UA NEGATIVE NEGATIVE mg/dL   Hgb urine dipstick NEGATIVE NEGATIVE   Bilirubin Urine NEGATIVE NEGATIVE   Ketones, ur NEGATIVE NEGATIVE mg/dL   Protein, ur NEGATIVE NEGATIVE mg/dL   Nitrite NEGATIVE NEGATIVE   Leukocytes, UA NEGATIVE  NEGATIVE    Comment: Performed at [facility], [address] [place], [place] [address]  Glucose, capillary     Status: Abnormal   Collection Time: 12/11/18  4:54 PM  Result Value Ref Range   Glucose-Capillary 191 (H) 70 - 99 mg/dL  ANA w/Reflex     Status: None   Collection Time: 12/11/18  5:30 PM  Result Value Ref Range   Anti Nuclear Antibody(ANA) Negative Negative    Comment: (NOTE) Performed At: [facility] [place], [place] [id number] Rush Farmer MD [id number]   ANCA titers     Status: None   Collection Time: 12/11/18  5:30 PM  Result Value Ref Range   C-ANCA <1:20 Neg:<1:20 titer   P-ANCA <1:20 Neg:<1:20 titer    Comment: (NOTE) The presence of positive fluorescence exhibiting P-ANCA or C-ANCA patterns alone is not specific  for the diagnosis of Wegener's Granulomatosis (WG) or microscopic polyangiitis. Decisions about treatment should not be based solely on ANCA IFA results.  The International ANCA Group Consensus recommends follow up testing of positive sera with both PR-3 and MPO-ANCA enzyme immunoassays. As many as 5% serum samples are positive only by EIA. Ref. AM J Clin Pathol 1999;111:507-513.    Atypical P-ANCA titer <1:20 Neg:<1:20 titer    Comment: (NOTE) The atypical pANCA pattern has been observed in a significant percentage of patients with ulcerative colitis, primary sclerosing cholangitis and autoimmune hepatitis. Performed At: [facility] [place], [place] [id number] Rush Farmer MD [id number]   Sedimentation rate     Status: Abnormal   Collection Time: 12/11/18  5:30 PM  Result Value Ref Range   Sed Rate 67 (H) 0 - 20 mm/hr    Comment: Performed at [facility], [address] [place], [place] [address]  Glomerular basement membrane antibodies     Status: None   Collection Time: 12/11/18  5:30 PM  Result Value Ref Range   GBM Ab 4 0 - 20 units    Comment: (NOTE)                    Negative                   0 - 20                   Weak Positive             21 - 30                   Moderate to Strong Positive   >30 Performed At: [facility] [place], [place] [id number] Rush Farmer MD [id number]   Rheumatoid factor     Status: None   Collection Time: 12/11/18  5:30 PM  Result Value Ref Range   Rhuematoid fact SerPl-aCnc <10.0 0.0 - 13.9 IU/mL    Comment: (NOTE) Performed At: [facility] [place], [place] [id number] Rush Farmer MD [id number]   Glucose, capillary     Status: Abnormal   Collection Time: 12/11/18  8:59 PM  Result Value Ref Range   Glucose-Capillary 161 (H) 70 - 99 mg/dL   Comment 1 Notify RN    Comment 2 Document in Chart   Glucose, capillary     Status: Abnormal   Collection Time: 12/11/18 11:47 PM  Result Value Ref Range   Glucose-Capillary 109 (H) 70 - 99 mg/dL   Comment 1 Notify RN    Comment 2 Document in Chart   Troponin I - ONCE - STAT     Status: Abnormal   Collection Time: 12/11/18 11:53 PM  Result Value Ref Range   Troponin I 0.04 (HH) <0.03 ng/mL    Comment: CRITICAL RESULT CALLED TO, READ BACK BY AND VERIFIED WITH MARICAR KIMREY ON 12/12/18 AT 0042 [facility] Performed at [facility], [address] [place], [place] [address]   Basic metabolic panel     Status: Abnormal   Collection Time: 12/12/18  2:40 AM  Result Value Ref Range   Sodium 135 135 - 145 mmol/L   Potassium 4.1 3.5 - 5.1 mmol/L   Chloride 106 98 - 111 mmol/L   CO2 21 (L) 22 - 32 mmol/L   Glucose, Bld 76 70 - 99 mg/dL   BUN 50 (H) 8 - 23 mg/dL  Creatinine, Ser 2.66 (H) 0.61 - 1.24 mg/dL   Calcium 8.3 (L) 8.9 - 10.3 mg/dL   GFR calc non Af Amer 22 (L) >60 mL/min   GFR calc Af Amer 25 (L) >60 mL/min   Anion gap 8 5 - 15    Comment: Performed at [facility], [address] [place], [place] [address]  Hemoglobin     Status: Abnormal   Collection Time: 12/12/18  2:40 AM   Result Value Ref Range   Hemoglobin 8.0 (L) 13.0 - 17.0 g/dL    Comment: Performed at [facility], [place], [place] [address]  Troponin I - Now Then [facility]     Status: Abnormal   Collection Time: 12/12/18  2:40 AM  Result Value Ref Range   Troponin I 0.03 (HH) <0.03 ng/mL    Comment: CRITICAL VALUE NOTED. VALUE IS CONSISTENT WITH PREVIOUSLY REPORTED/CALLED VALUE [facility] Performed at [facility], [address] [place], [place] [address]   Glucose, capillary     Status: Abnormal   Collection Time: 12/12/18  3:20 AM  Result Value Ref Range   Glucose-Capillary 63 (L) 70 - 99 mg/dL   Comment 1 Notify RN    Comment 2 Document in Chart   Glucose, capillary     Status: Abnormal   Collection Time: 12/12/18  4:20 AM  Result Value Ref Range   Glucose-Capillary 118 (H) 70 - 99 mg/dL   Comment 1 Notify RN    Comment 2 Document in Chart   Troponin I - Now Then [facility]     Status: Abnormal   Collection Time: 12/12/18  6:32 AM  Result Value Ref Range   Troponin I 0.03 (HH) <0.03 ng/mL    Comment: CRITICAL VALUE NOTED. VALUE IS CONSISTENT WITH PREVIOUSLY REPORTED/CALLED VALUE DAS Performed at [facility], [address] [place], [place] [address]   Glucose, capillary     Status: Abnormal   Collection Time: 12/12/18  8:10 AM  Result Value Ref Range   Glucose-Capillary 121 (H) 70 - 99 mg/dL   Comment 1 Notify RN    Comment 2 Document in Chart   Glucose, capillary     Status: None   Collection Time: 12/12/18 11:34 AM  Result Value Ref Range   Glucose-Capillary 70 70 - 99 mg/dL   Comment 1 Notify RN    Comment 2 Document in Chart   Glucose, capillary     Status: Abnormal   Collection Time: 12/12/18  4:44 PM  Result Value Ref Range   Glucose-Capillary 136 (H) 70 - 99 mg/dL  Occult blood card to lab, stool     Status: None   Collection Time: 12/12/18  5:30 PM  Result Value Ref Range   Fecal Occult Bld NEGATIVE NEGATIVE    Comment: Performed at  [facility], [address] [place], [place] [address]  Glucose, capillary     Status: None   Collection Time: 12/12/18  9:33 PM  Result Value Ref Range   Glucose-Capillary 95 70 - 99 mg/dL   Comment 1 Notify RN    Comment 2 Document in Chart   Glucose, capillary     Status: Abnormal   Collection Time: 12/13/18 12:36 AM  Result Value Ref Range   Glucose-Capillary 183 (H) 70 - 99 mg/dL   Comment 1 Notify RN    Comment 2 Document in Chart   Glucose, capillary     Status: Abnormal   Collection Time: 12/13/18  4:05 AM  Result Value Ref Range   Glucose-Capillary 151 (H) 70 - 99 mg/dL  Basic metabolic panel     Status: Abnormal   Collection Time: 12/13/18  4:19 AM  Result Value Ref Range   Sodium 133 (L) 135 - 145 mmol/L   Potassium 4.5 3.5 - 5.1 mmol/L   Chloride 106 98 - 111 mmol/L   CO2 20 (L) 22 - 32 mmol/L   Glucose, Bld 139 (H) 70 - 99 mg/dL   BUN 52 (H) 8 - 23 mg/dL   Creatinine, Ser 2.61 (H) 0.61 - 1.24 mg/dL   Calcium 8.1 (L) 8.9 - 10.3 mg/dL   GFR calc non Af Amer 23 (L) >60 mL/min   GFR calc Af Amer 26 (L) >60 mL/min   Anion gap 7 5 - 15    Comment: Performed at [facility], [address] [place], [place] [address]  Hemoglobin     Status: Abnormal   Collection Time: 12/13/18  4:19 AM  Result Value Ref Range   Hemoglobin 7.6 (L) 13.0 - 17.0 g/dL    Comment: Performed at [facility], [address] [place], [place] [address]  Glucose, capillary     Status: None   Collection Time: 12/13/18  7:33 AM  Result Value Ref Range   Glucose-Capillary 75 70 - 99 mg/dL  Prepare RBC     Status: None   Collection Time: 12/13/18  8:30 AM  Result Value Ref Range   Order Confirmation      ORDER PROCESSED BY BLOOD BANK Performed at [facility], [address] [place],  [address]   Glucose, capillary     Status: Abnormal   Collection Time: 12/13/18 11:37 AM  Result Value Ref Range   Glucose-Capillary 59 (L) 70 - 99 mg/dL   Type and screen [facility]     Status: None   Collection Time: 12/13/18 12:44 PM  Result Value Ref Range   ABO/RH(D) A POS    Antibody Screen POS    Sample Expiration 12/16/2018    Antibody Identification ANTI K    Unit Number [id number]    Blood Component Type RED CELLS,LR    Unit division 00    Status of Unit ISSUED,FINAL    Transfusion Status OK TO TRANSFUSE    Crossmatch Result COMPATIBLE    Donor AG Type NEGATIVE FOR KELL ANTIGEN    Unit Number [id number]    Blood Component Type RBC LR PHER2    Unit division 00    Status of Unit ISSUED,FINAL    Transfusion Status OK TO TRANSFUSE    Crossmatch Result COMPATIBLE    Donor AG Type NEGATIVE FOR KELL ANTIGEN    Unit Number [id number]    Blood Component Type RED CELLS,LR    Unit division 00    Status of Unit REL FROM [facility]    Transfusion Status OK TO TRANSFUSE    Crossmatch Result COMPATIBLE    Unit Number [id number]    Blood Component Type RED CELLS,LR    Unit division 00    Status of Unit REL FROM [place]    Transfusion Status OK TO TRANSFUSE    Crossmatch Result COMPATIBLE   Glucose, capillary     Status: Abnormal   Collection Time: 12/13/18 12:44 PM  Result Value Ref Range   Glucose-Capillary 129 (H) 70 - 99 mg/dL  BPAM RBC     Status: None   Collection Time: 12/13/18 12:44 PM  Result  Value Ref Range   ISSUE DATE / TIME 161096045409    Blood Product Unit Number [id number]    PRODUCT CODE [id number]    Unit Type and Rh 6200    Blood Product Expiration Date 202001122359    ISSUE DATE / TIME 130865784696    Blood Product Unit Number [id number]    PRODUCT CODE [id number]    Unit Type and Rh 4403    Blood Product Expiration Date 474259563875    Blood Product Unit Number [id number]    Unit Type and Rh 6063    Blood Product Expiration Date 016010932355    Blood Product Unit Number [id number]    Unit Type and Rh 6200    Blood Product Expiration Date 376283151761   Glucose,  capillary     Status: None   Collection Time: 12/13/18  4:26 PM  Result Value Ref Range   Glucose-Capillary 92 70 - 99 mg/dL  Glucose, capillary     Status: None   Collection Time: 12/13/18  9:58 PM  Result Value Ref Range   Glucose-Capillary 97 70 - 99 mg/dL   Comment 1 Notify RN    Comment 2 Document in Chart   Prepare RBC     Status: None   Collection Time: 12/14/18 12:38 AM  Result Value Ref Range   Order Confirmation      ORDER PROCESSED BY BLOOD BANK Performed at [facility], [address] [place], [place] [address]   Glucose, capillary     Status: Abnormal   Collection Time: 12/14/18  2:47 AM  Result Value Ref Range   Glucose-Capillary 121 (H) 70 - 99 mg/dL   Comment 1 Notify RN    Comment 2 Document in Chart   Basic metabolic panel     Status: Abnormal   Collection Time: 12/14/18  6:23 AM  Result Value Ref Range   Sodium 136 135 - 145 mmol/L   Potassium 4.4 3.5 - 5.1 mmol/L   Chloride 105 98 - 111 mmol/L   CO2 22 22 - 32 mmol/L   Glucose, Bld 86 70 - 99 mg/dL   BUN 36 (H) 8 - 23 mg/dL   Creatinine, Ser 1.92 (H) 0.61 - 1.24 mg/dL   Calcium 9.0 8.9 - 10.3 mg/dL   GFR calc non Af Amer 33 (L) >60 mL/min   GFR calc Af Amer 38 (L) >60 mL/min   Anion gap 9 5 - 15    Comment: Performed at [facility], [address] [place], [place] [address]  Hemoglobin and hematocrit, blood     Status: Abnormal   Collection Time: 12/14/18  6:23 AM  Result Value Ref Range   Hemoglobin 11.2 (L) 13.0 - 17.0 g/dL    Comment: REPEATED TO VERIFY   HCT 35.7 (L) 39.0 - 52.0 %    Comment: Performed at [facility], [address] [place], [place] [address]  Glucose, capillary     Status: None   Collection Time: 12/14/18  7:22 AM  Result Value Ref Range   Glucose-Capillary 78 70 - 99 mg/dL  Surgical pathology     Status: None   Collection Time: 12/14/18  8:47 AM  Result Value Ref Range   SURGICAL PATHOLOGY      Surgical Pathology CASE:  [id number] PATIENT: Kippy Shrader Surgical Pathology Report     SPECIMEN SUBMITTED: A. Colon polyp, ascending; cbx B. Colon polyp, sigmoid; cold snare  CLINICAL HISTORY: None provided  PRE-OPERATIVE DIAGNOSIS: Anemia  POST-OPERATIVE DIAGNOSIS: AVM,  polyps, diverticulosis     DIAGNOSIS: A. COLON POLYP, ASCENDING; COLD BIOPSY: - TUBULAR ADENOMA. - NEGATIVE FOR HIGH-GRADE DYSPLASIA AND MALIGNANCY.  B. COLON POLYP, SIGMOID; COLD SNARE: - TUBULAR ADENOMA. - NEGATIVE FOR HIGH-GRADE DYSPLASIA AND MALIGNANCY.   GROSS DESCRIPTION: A. Labeled: C BX ascending colon polyp Received: Formalin Tissue fragment(s): 1 Size: 0.5 cm Description: Tan soft tissue fragment Entirely submitted in 1 cassette.  B. Labeled: Cold snare sigmoid colon polyp Received: Formalin Tissue fragment(s): Multiple Size: Aggregate, 2.5 x 0.5 x 0.2 cm Description: Tan soft tissue fragment admixed with fecal material Entirely submitted in 1 cassette.    Final Diagnosis performed by Allena Napoleon, MD.   Electronically signed 12/17/2018 10:31:26AM The electronic signature indicates that the named Attending Pathologist has evaluated the specimen  Technical component performed at [facility], [address] [place], [place] [address] Lab: [phone] Dir: Rush Farmer, MD, MMM  Professional component performed at [facility], [facility], [address], [place], [place] [address] Lab: [phone] Dir: Dellia Nims. Rubinas, MD   Glucose, capillary     Status: Abnormal   Collection Time: 12/14/18 11:36 AM  Result Value Ref Range   Glucose-Capillary 114 (H) 70 - 99 mg/dL  Glucose, capillary     Status: Abnormal   Collection Time: 12/14/18  4:27 PM  Result Value Ref Range   Glucose-Capillary 234 (H) 70 - 99 mg/dL  Glucose, capillary     Status: Abnormal   Collection Time: 12/14/18  9:00 PM  Result Value Ref Range   Glucose-Capillary 214 (H) 70 - 99 mg/dL  Glucose, capillary     Status:  Abnormal   Collection Time: 12/15/18  3:11 AM  Result Value Ref Range   Glucose-Capillary 178 (H) 70 - 99 mg/dL  Basic metabolic panel     Status: Abnormal   Collection Time: 12/15/18  4:51 AM  Result Value Ref Range   Sodium 135 135 - 145 mmol/L   Potassium 4.3 3.5 - 5.1 mmol/L   Chloride 107 98 - 111 mmol/L   CO2 20 (L) 22 - 32 mmol/L   Glucose, Bld 184 (H) 70 - 99 mg/dL   BUN 28 (H) 8 - 23 mg/dL   Creatinine, Ser 1.66 (H) 0.61 - 1.24 mg/dL   Calcium 8.3 (L) 8.9 - 10.3 mg/dL   GFR calc non Af Amer 39 (L) >60 mL/min   GFR calc Af Amer 45 (L) >60 mL/min   Anion gap 8 5 - 15    Comment: Performed at [facility], [address] [place], [place] [address]  Hemoglobin     Status: Abnormal   Collection Time: 12/15/18  4:51 AM  Result Value Ref Range   Hemoglobin 10.8 (L) 13.0 - 17.0 g/dL    Comment: Performed at [facility], [address] [place], [place] [address]  Glucose, capillary     Status: Abnormal   Collection Time: 12/15/18  7:31 AM  Result Value Ref Range   Glucose-Capillary 154 (H) 70 - 99 mg/dL  Glucose, capillary     Status: Abnormal   Collection Time: 12/15/18 11:33 AM  Result Value Ref Range   Glucose-Capillary 247 (H) 70 - 99 mg/dL    Radiology: Dg Chest 2 View  Result Date: 12/05/2018 CLINICAL DATA:  Chest pain began this morning EXAM: CHEST - 2 VIEW COMPARISON:  Chest x-ray of 11/16/2017 FINDINGS: No active infiltrate or effusion is seen. Mediastinal and hilar contours appear stable. Cardiomegaly is stable. No acute bony abnormality is  seen. Coronary artery stent is noted. IMPRESSION: Stable cardiomegaly.  No active lung disease. Electronically Signed   By: Ivar Drape M.D.   On: 12/05/2018 10:47    No results found.  Dg Chest 2 View  Result Date: 12/05/2018 CLINICAL DATA:  Chest pain began this morning EXAM: CHEST - 2 VIEW COMPARISON:  Chest x-ray of 11/16/2017 FINDINGS: No active infiltrate or effusion is seen. Mediastinal and  hilar contours appear stable. Cardiomegaly is stable. No acute bony abnormality is seen. Coronary artery stent is noted. IMPRESSION: Stable cardiomegaly.  No active lung disease. Electronically Signed   By: Ivar Drape M.D.   On: 12/05/2018 10:47   Ct Chest High Resolution  Result Date: 12/11/2018 CLINICAL DATA:  Inpatient. Hemoptysis. Dyspnea. Generalized weakness. Hypoxia. EXAM: CT CHEST WITHOUT CONTRAST TECHNIQUE: Multidetector CT imaging of the chest was performed following the standard protocol without intravenous contrast. High resolution imaging of the lungs, as well as inspiratory and expiratory imaging, was performed. COMPARISON:  05/06/2017 chest CT angiogram. Chest radiograph from one day prior. FINDINGS: Cardiovascular: Top-normal heart size. No significant pericardial effusion/thickening. Three-vessel coronary atherosclerosis. Aortic valvular calcification. Atherosclerotic nonaneurysmal thoracic aorta. Normal caliber pulmonary arteries. Mediastinum/Nodes: No discrete thyroid nodules. Unremarkable esophagus. No axillary adenopathy. Mildly enlarged 1.2 cm subcarinal node (series 2/image 73), unchanged since 05/06/2017 chest CT. No new pathologically enlarged mediastinal nodes. No discrete hilar adenopathy on this noncontrast scan. Lungs/Pleura: No pneumothorax. Small dependent bilateral pleural effusions. No acute consolidative airspace disease, lung masses or significant pulmonary nodules. Scattered small calcified granulomas throughout both lungs. No significant air trapping on the expiration sequence. There is patchy ground-glass opacity and interlobular septal thickening throughout both lungs, most prominent in the dependent lower lungs. There is mild patchy subpleural reticulation with minimal traction bronchiolectasis in both lungs with a mild basilar predominance. No frank honeycombing. Upper abdomen: Small hiatal hernia. Hypodense 3.0 cm right liver dome mass (series 2/image 103), unchanged  since 03/29/2014 CT, where it was seen to represent a hemangioma. Musculoskeletal: No aggressive appearing focal osseous lesions. Mild thoracic spondylosis. IMPRESSION: 1. Aortic valvular calcification, which can be correlated with aortic valvular stenosis. Consider echocardiographic correlation. Top-normal heart size. 2. Small dependent bilateral pleural effusions. 3. Patchy ground-glass opacity and interlobular septal thickening, most prominent in the dependent lower lungs. Mild patchy subpleural reticulation with minimal traction bronchiolectasis, with a mild basilar predominance. No honeycombing. 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 A follow-up high-resolution chest CT study could be obtained in 12 months to assess temporal pattern stability, as clinically warranted. 4. Three-vessel coronary atherosclerosis. 5. Small hiatal hernia. Aortic Atherosclerosis (ICD10-I70.0). Electronically Signed   By: Ilona Sorrel M.D.   On: 12/11/2018 20:26   Nm Pulmonary Perf And Vent  Result Date: 12/11/2018 CLINICAL DATA:  Acute onset of shortness of breath. Hemoptysis. Chronic renal failure precludes IV contrast administration. EXAM: NUCLEAR MEDICINE VENTILATION - PERFUSION LUNG SCAN TECHNIQUE: Ventilation images were obtained in multiple projections using inhaled aerosol Tc-67m DTPA. Perfusion images were obtained in multiple projections after intravenous injection of Tc-40m MAA. RADIOPHARMACEUTICALS:  33.3 mCi of Tc-44m DTPA aerosol inhalation and 4.3 mCi Tc67m MAA IV COMPARISON:  No prior nuclear imaging. Chest x-rays 12/10/2018, 12/05/2018 and earlier. FINDINGS: Ventilation: No focal ventilatory defect. Ingested aerosol in the esophagus and stomach. Perfusion: Normal perfusion. No wedge-shaped perfusion defects to  indicate pulmonary embolism. IMPRESSION: Normal examination. Electronically  Signed   By: Evangeline Dakin M.D.   On: 12/11/2018 13:05   Dg Chest Port 1 View  Result Date: 12/10/2018 CLINICAL DATA:  Patient with shortness of breath. EXAM: PORTABLE CHEST 1 VIEW COMPARISON:  Chest radiograph 12/05/2018 FINDINGS: Monitoring leads overlie the patient. Stable cardiomegaly. Interval development of bilateral interstitial pulmonary opacities. No large pleural effusion or pneumothorax. IMPRESSION: Cardiomegaly and interstitial opacities favored to represent edema. Electronically Signed   By: Lovey Newcomer M.D.   On: 12/10/2018 15:32      Assessment and Plan: Patient Active Problem List   Diagnosis Date Noted  . Polyp of sigmoid colon   . Benign neoplasm of ascending colon   . Hemorrhoids without complication   . Diverticulosis of large intestine without diverticulitis   . Reflux esophagitis   . Lymphangiectasia   . Arteriovenous malformation of duodenum   . Symptomatic anemia   . SOB (shortness of breath)   . Iron deficiency anemia due to chronic blood loss   . Unstable angina ([place])   . Anemia of chronic renal failure, stage 4 (severe) ([place])   . Chest pain 11/14/2018  . CAD (coronary artery disease) 02/27/2018  . Sacroiliac joint pain 10/08/2017  . Pain in limb 09/24/2017  . Neuropathy 07/24/2017  . NSTEMI (non-ST elevated myocardial infarction) ([place]) 05/06/2017  . PAD (peripheral artery disease) ([place]) 05/01/2017  . History of stroke 01/02/2017  . Barrett's esophagus 10/02/2016  . Carotid stenosis 10/02/2016  . Essential hypertension 10/02/2016  . Hyperlipidemia 10/02/2016  . Diabetes ([place]) 10/02/2016  . 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 Uncontrolled type 2 diabetes mellitus with hyperglycemia, with long-term current use of insulin ([place]) 04/10/2016  . TIA (transient ischemic attack) 10/08/2015    1. Hemoptysis resolved right now we will continue to monitor  closely.  Overall patient is doing better will follow-up with his primary cardiologist 2. NonStemi cardiologist has said that he is going to be referred to [facility] for further evaluation 3. 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 Blood loss anemia status post endoscopy for work-up 5. Obstructive sleep apnea using CPAP device which will be continued  General Counseling: I have discussed the findings of the evaluation and examination with Marland Kitchen.  I have also discussed any further diagnostic evaluation thatmay be needed or ordered today. Jaquawn verbalizes understanding of the findings of todays visit. We also reviewed his medications today and discussed drug interactions and side effects including but not limited excessive drowsiness and altered mental states. We also discussed that there is always a risk not just to him but also people around him. he has been encouraged to call the office with any questions or concerns that should arise related to todays visit.    Time spent: 15 minutes  I have personally obtained a history, examined the patient, evaluated laboratory and imaging results, formulated the assessment and plan and placed orders.    Allyne Gee, MD [facility] Pulmonary and Critical Care Sleep medicine

## 2018-12-22 NOTE — Patient Instructions (Signed)
Acute Coronary Syndrome    Acute coronary syndrome (ACS) is a serious problem in which there is suddenly not enough blood and oxygen reaching the heart. ACS can result in chest pain or a heart attack.  This condition is a medical emergency. If you have any symptoms of this condition, get help right away.  What are the causes?  This condition may be caused by:   Buildup of fat and cholesterol inside of the arteries (atherosclerosis). This is the most common cause. The buildup (plaque) can cause blood vessels in the heart (coronary arteries) to become narrow or blocked, which reduces blood flow to the heart. Plaque can also break off and lead to a clot, which can block an artery and cause a heart attack or stroke.   Sudden tightening of the muscles around the coronary arteries (coronary spasm).   Tearing of a coronary artery (spontaneous coronary artery dissection).   Very low blood pressure (hypotension).   An abnormal heartbeat (arrhythmia).   Other medical conditions that cause a decrease of oxygen to the heart, such as anemiaorrespiratory failure.   Using cocaine or methamphetamine.  What increases the risk?  The following factors may make you more likely to develop this condition:   Age. The risk for ACS increases as you get older.   History of chest pain, heart attack, peripheral artery disease, or stroke.   Having taken chemotherapy or immune-suppressing medicines.   Being male.   Family history of chest pain, heart disease, or stroke.   Smoking.   Not exercising enough.   Being overweight.   High cholesterol.   High blood pressure (hypertension).   Diabetes.   Excessive alcohol use.  What are the signs or symptoms?  Common symptoms of this condition include:   Chest pain. The pain may last a long time, or it may stop and come back (recur). It may feel like:  ? Crushing or squeezing.  ? Tightness, pressure, fullness, or heaviness.   Arm, neck, jaw, or back pain.   Heartburn or  indigestion.   Shortness of breath.   Nausea.   Sudden cold sweats.   Light-headedness.   Dizziness, or passing out.   Tiredness (fatigue).  Sometimes there are no symptoms.  How is this diagnosed?  This condition may be diagnosed based on:   Your medical history and symptoms.   An electrocardiogram (ECG). This imaging test measures the heart's electrical activity.   Blood tests. Cardiac blood tests may need to be repeated at designated time intervals.   Chest X-ray.   A CT scan of the chest.   A coronary angiogram. This is a procedure in which dye is injected into the bloodstream and then X-rays are taken to show if there is a blockage in a coronary artery.   Exercise stress testing.   Echocardiography. This is a test that uses sound waves to produce detailed images of the heart.  How is this treated?  The treatment is to restore blood flow to the heart as soon as possible. Treatment for this condition may include:   Oxygen therapy.   Medicines, such as:  ? Antiplatelet medicines and blood-thinning medicines, such as aspirin. These help prevent blood clots.  ? Medicine that dissolves any blood clots (fibrinolytic therapy).  ? Blood pressure medicines.  ? Nitroglycerin. This helps relieve chest pain and widens blood vessels to improve blood flow.  ? Pain medicine.  ? Cholesterol-lowering medicine.   Surgery, such as:  ? Coronary angioplasty with   stent placement. This involves placing a small piece of metal that looks like mesh or a spring into a narrow coronary artery. This widens the artery and keep it open.  ? Coronary artery bypass surgery. This involves taking a section of a blood vessel from a different part of your body, and placing it on the blocked coronary artery to allow blood to flow around (bypass) the blockage.   Cardiac rehabilitation. This is a program that helps improve your health and well-being. It includes exercise training, education, and counseling to help you recover.  Follow  these instructions at home:  Eating and drinking   Eat a heart-healthy diet that includes whole grains, fruits and vegetables, lean proteins, and low-fat or nonfat dairy products.   Limit how much salt (sodium) you eat as told by your health care provider. Follow instructions from your health care provider about any other eating or drinking restrictions, such as limiting foods that are high in fat and processed sugars.   Use healthy cooking methods such as roasting, grilling, broiling, baking, poaching, steaming, or stir-frying.   Talk with a dietitian to learn about healthy cooking methods and how to eat less sodium.  Medicines   Take over-the-counter and prescription medicines only as told by your health care provider.   Do not take these medicines unless your health care provider approves:  ? Vitamin supplements that contain vitamin A or vitamin E.  ? Nonsteroidal anti-inflammatory drugs (NSAIDs), such as ibuprofen, naproxen, or celecoxib.  ? Hormone replacement therapy that contains estrogen.  If you are taking blood thinners:   Talk with your health care provider before you take any medicines that contain aspirin or NSAIDs. These medicines increase your risk for dangerous bleeding.   Take your medicine exactly as told, at the same time every day.   Avoid activities that could cause injury or bruising, and follow instructions about how to prevent falls.   Wear a medical alert bracelet, and carry a card that lists what medicines you take.  Activity   Join a cardiac rehabilitation program. An exercise plan will be developed for you.   Ask your health care provider:  ? What activities and exercises are safe for you.  ? If you should follow specific instructions about lifting, driving, or climbing stairs.  Lifestyle   Do not use any products that contain nicotine or tobacco, such as cigarettes and e-cigarettes. If you need help quitting, ask your health care provider.   If your health care provider  says that alcohol is safe for you, limit your alcohol intake to no more than 1 drink a day. One drink equals 12 oz of beer, 5 oz of wine, or 1 oz of hard liquor.   Maintain a healthy weight. If you need to lose weight, work with your health care provider to do so safely.  General instructions   Tell all the health care providers who care for you about your heart condition, including your dentist. This may affect the medicines or treatment you receive.   Manage any other health conditions you have, such as hypertension or diabetes. These conditions affect your heart.   Learn ways to manage stress.   Get screened for depression, and get mental health treatment if you need it. People with ACS are at higher risk for depression.   Keep your vaccinations up to date. Get the flu shot (influenza vaccine) every year.   If directed, monitor your blood pressure at home.   Keep all   follow-up visits as told by your health care provider. This is important.  Contact a health care provider if:   You feel overwhelmed or sad.   You have trouble doing your daily activities.  Get help right away if:   You have pain in your chest, neck, arm, jaw, stomach, or back that recurs, and:  ? It lasts for more than a few minutes.  ? It is not relieved by taking the medicineyour health care provider prescribed.   You have unexplained:  ? Heavy sweating.  ? Heartburn or indigestion.  ? Nausea or vomiting.  ? Shortness of breath.  ? Difficulty breathing.  ? Fatigue.  ? Nervousness or anxiety.  ? Weakness.  ? Diarrhea.  ? Dark stools or blood in your stool.   You have sudden light-headedness or dizziness.   Your blood pressure is higher than 180/120.   You faint.   You have thoughts about hurting yourself.  These symptoms may represent a serious problem that is an emergency. Do not wait to see if the symptoms will go away. Get medical help right away. Call your local emergency services (911 in the [place] Do not drive yourself to the  hospital.  If you ever feel like you may hurt yourself or others, or have thoughts about taking your own life, get help right away. You can go to your nearest emergency department or call:   Emergency services (911 in the [place]   A suicide crisis helpline, such as the National Suicide Prevention Lifeline at 1-800-273-8255. This is open 24 hours a day.  Summary   Acute coronary syndrome (ACS) is when there is not enough blood and oxygen being supplied to the heart. ACS can result in chest pain or a heart attack.   Acute coronary syndrome is a medical emergency. If you have any symptoms of this condition, get help right away.   Treatment includes medicines and procedures to open the blocked arteries and restore blood flow.  This information is not intended to replace advice given to you by your health care provider. Make sure you discuss any questions you have with your health care provider.  Document Released: 11/26/2005 Document Revised: 08/06/2017 Document Reviewed: 08/06/2017  [employer]vier Interactive Patient Education  2019 [employer]

## 2018-12-25 ENCOUNTER — Encounter: Payer: Self-pay | Admitting: Gastroenterology

## 2018-12-25 ENCOUNTER — Ambulatory Visit (INDEPENDENT_AMBULATORY_CARE_PROVIDER_SITE_OTHER): Payer: Medicare Other | Admitting: Gastroenterology

## 2018-12-25 VITALS — BP 170/68 | HR 56 | Ht 66.0 in | Wt 192.6 lb

## 2018-12-25 DIAGNOSIS — D500 Iron deficiency anemia secondary to blood loss (chronic): Secondary | ICD-10-CM

## 2018-12-25 NOTE — Progress Notes (Signed)
Primary Care Physician: Cletis Athens, MD  Primary Gastroenterologist:  Dr. [name]  Chief Complaint  Patient presents with  . Hospitalization Follow-up    Anemia    HPI: Mike Decker is a 78 y.o. male here for follow-up after being in the hospital.  The patient was found in the hospital to have anemia.  The patient had an EGD and colonoscopy while in the hospital by Dr. Serena Croissant.  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  The patient was charged with outpatient follow-up.  The patient had a previous EGD in June 2019 that showed the patient to have multiple biopsies throughout the esophagus which none of them showed any intestinal metaplasia.  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  Current Outpatient Medications  Medication Sig Dispense Refill  . acetaminophen (TYLENOL) 500 MG tablet Take 1,000 mg by mouth every 6 (six) hours as needed for mild pain, moderate pain or headache.    . allopurinol (ZYLOPRIM) 100 MG tablet Take 200 mg by mouth 2 (two) times daily.     Marland Kitchen aspirin (ASPIRIN CHILDRENS) 81 MG chewable tablet Chew 1 tablet (81 mg total) by mouth daily. 120 tablet 0  . budesonide-formoterol (SYMBICORT) 160-4.5 MCG/ACT inhaler Inhale 2 puffs into the lungs 2 (two) times daily.    . cetirizine (ZYRTEC) 5 MG tablet Take 5 mg by mouth daily as needed for allergies or rhinitis.     . citalopram (CELEXA) 10 MG tablet Take 10 mg by mouth daily.    . clopidogrel (PLAVIX) 75 MG tablet Take 1 tablet (75 mg total) by mouth daily. 30 tablet 0  . colchicine 0.6 MG tablet Take 1 tablet (0.6 mg total) by mouth daily. (Patient taking differently: Take 1.8 mg by mouth daily as needed (gout flares). ) 30 tablet 0  . dextromethorphan-guaiFENesin (MUCINEX DM) 30-600 MG 12hr tablet Take 1 tablet by mouth 2 (two) times daily as needed for cough.    . EPINEPHrine (EPI-PEN) 0.3 mg/0.3 mL SOAJ injection Inject into the  muscle as needed (anaphylaxis).     . fluticasone (FLONASE) 50 MCG/ACT nasal spray Place 2 sprays into both nostrils daily.   3  . furosemide (LASIX) 20 MG tablet Take 2 tablets (40 mg total) by mouth daily.    . insulin aspart (NOVOLOG FLEXPEN) 100 UNIT/ML FlexPen Inject 14-24 Units into the skin See admin instructions. Inject 14u under the skin at breakfast, 24u at lunch and 24u under the skin at dinnertime according to sliding scale    . insulin glargine (LANTUS) 100 UNIT/ML injection Inject 62 Units into the skin at bedtime.     . isosorbide mononitrate (IMDUR) 30 MG 24 hr tablet Take 1 tablet (30 mg total) by mouth 2 (two) times daily. 60 tablet 0  . magnesium gluconate (MAGONATE) 500 MG tablet Take 500 mg by mouth 2 (two) times daily.     . methocarbamol (ROBAXIN) 500 MG tablet Take 500 mg by mouth 2 (two) times daily.   0  . metoprolol tartrate (LOPRESSOR) 50 MG tablet Take 75 mg by mouth 2 (two) times daily.     . nitroGLYCERIN (NITROSTAT) 0.4 MG SL tablet Place 0.4 mg under the tongue every 5 (five) minutes x 3 doses as needed for chest pain.     . Omega-3 Fatty Acids (FISH OIL) 1000 MG CAPS Take 1 capsule by mouth daily.     Marland Kitchen  pantoprazole (PROTONIX) 40 MG tablet Take 40 mg by mouth 2 (two) times daily.    . ranolazine (RANEXA) 500 MG 12 hr tablet Take 1 tablet (500 mg total) by mouth 2 (two) times daily. 60 tablet 1  . rosuvastatin (CRESTOR) 40 MG tablet Take 40 mg by mouth every evening.     . traMADol (ULTRAM) 50 MG tablet Take 50 mg by mouth every 4 (four) hours as needed for moderate pain.     . Vitamin D, Ergocalciferol, (DRISDOL) 50000 units CAPS capsule Take 50,000 Units by mouth every 30 (thirty) days.   1  . ipratropium-albuterol (DUONEB) 0.5-2.5 (3) MG/3ML SOLN Take 3 mLs by nebulization every 4 (four) hours as needed. (Patient not taking: Reported on 12/25/2018) 360 mL 0   No current facility-administered medications for this visit.     Allergies as of 12/25/2018 - Review  Complete 12/25/2018  Allergen Reaction Noted  . Gabapentin  08/29/2017  . Peanut-containing drug products Anaphylaxis 03/30/2014  . Penicillins Hives and Rash 03/30/2014  . Bee venom Swelling 03/30/2014  . Influenza vaccines Hives 03/30/2014  . Inh [isoniazid] Hives 03/30/2014  . Kenalog [triamcinolone acetonide] Hives 03/30/2014  . Levaquin [levofloxacin] Other (See Comments) 10/20/2018  . Nalfon [fenoprofen calcium] Hives 03/30/2014  . Naproxen  02/27/2018  . Peanut oil  02/27/2018  . Nsaids Rash     ROS:  General: Negative for anorexia, weight loss, fever, chills, fatigue, weakness. ENT: Negative for hoarseness, difficulty swallowing , nasal congestion. CV: Negative for chest pain, angina, palpitations, dyspnea on exertion, peripheral edema.  Respiratory: Negative for dyspnea at rest, dyspnea on exertion, cough, sputum, wheezing.  GI: See history of present illness. GU:  Negative for dysuria, hematuria, urinary incontinence, urinary frequency, nocturnal urination.  Endo: Negative for unusual weight change.    Physical Examination:   BP (!) 170/68   Pulse (!) 56   Ht 5\' 6"  (1.676 m)   Wt 192 lb 9.6 oz (87.4 kg)   BMI 31.09 kg/m   General: Well-nourished, well-developed in no acute distress.  Eyes: No icterus. Conjunctivae pink. Mouth: Oropharyngeal mucosa moist and pink , no lesions erythema or exudate. Lungs: Clear to auscultation bilaterally. Non-labored. Heart: Regular rate and rhythm, no murmurs rubs or gallops.  Abdomen: Bowel sounds are normal, nontender, nondistended, no hepatosplenomegaly or masses, no abdominal bruits or hernia , no rebound or guarding.   Extremities: No lower extremity edema. No clubbing or deformities. Neuro: Alert and oriented x 3.  Grossly intact. Skin: Warm and dry, no jaundice.   Psych: Alert and cooperative, normal mood and affect.  Labs:    Imaging Studies: Dg Chest 2 View  Result Date: 12/05/2018 CLINICAL DATA:  Chest pain began  this morning EXAM: CHEST - 2 VIEW COMPARISON:  Chest x-ray of 11/16/2017 FINDINGS: No active infiltrate or effusion is seen. Mediastinal and hilar contours appear stable. Cardiomegaly is stable. No acute bony abnormality is seen. Coronary artery stent is noted. IMPRESSION: Stable cardiomegaly.  No active lung disease. Electronically Signed   By: Ivar Drape M.D.   On: 12/05/2018 10:47   Ct Chest High Resolution  Result Date: 12/11/2018 CLINICAL DATA:  Inpatient. Hemoptysis. Dyspnea. Generalized weakness. Hypoxia. EXAM: CT CHEST WITHOUT CONTRAST TECHNIQUE: Multidetector CT imaging of the chest was performed following the standard protocol without intravenous contrast. High resolution imaging of the lungs, as well as inspiratory and expiratory imaging, was performed. COMPARISON:  05/06/2017 chest CT angiogram. Chest radiograph from one day prior. FINDINGS: Cardiovascular: Top-normal heart  size. No significant pericardial effusion/thickening. Three-vessel coronary atherosclerosis. Aortic valvular calcification. Atherosclerotic nonaneurysmal thoracic aorta. Normal caliber pulmonary arteries. Mediastinum/Nodes: No discrete thyroid nodules. Unremarkable esophagus. No axillary adenopathy. Mildly enlarged 1.2 cm subcarinal node (series 2/image 73), unchanged since 05/06/2017 chest CT. No new pathologically enlarged mediastinal nodes. No discrete hilar adenopathy on this noncontrast scan. Lungs/Pleura: No pneumothorax. Small dependent bilateral pleural effusions. No acute consolidative airspace disease, lung masses or significant pulmonary nodules. Scattered small calcified granulomas throughout both lungs. No significant air trapping on the expiration sequence. There is patchy ground-glass opacity and interlobular septal thickening throughout both lungs, most prominent in the dependent lower lungs. There is mild patchy subpleural reticulation with minimal traction bronchiolectasis in both lungs with a mild basilar  predominance. No frank honeycombing. Upper abdomen: Small hiatal hernia. Hypodense 3.0 cm right liver dome mass (series 2/image 103), unchanged since 03/29/2014 CT, where it was seen to represent a hemangioma. Musculoskeletal: No aggressive appearing focal osseous lesions. Mild thoracic spondylosis. IMPRESSION: 1. Aortic valvular calcification, which can be correlated with aortic valvular stenosis. Consider echocardiographic correlation. Top-normal heart size. 2. Small dependent bilateral pleural effusions. 3. Patchy ground-glass opacity and interlobular septal thickening, most prominent in the dependent lower lungs. Mild patchy subpleural reticulation with minimal traction bronchiolectasis, with a mild basilar predominance. No honeycombing. 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 A follow-up high-resolution chest CT study could be obtained in 12 months to assess temporal pattern stability, as clinically warranted. 4. Three-vessel coronary atherosclerosis. 5. Small hiatal hernia. Aortic Atherosclerosis (ICD10-I70.0). Electronically Signed   By: Ilona Sorrel M.D.   On: 12/11/2018 20:26   Nm Pulmonary Perf And Vent  Result Date: 12/11/2018 CLINICAL DATA:  Acute onset of shortness of breath. Hemoptysis. Chronic renal failure precludes IV contrast administration. EXAM: NUCLEAR MEDICINE VENTILATION - PERFUSION LUNG SCAN TECHNIQUE: Ventilation images were obtained in multiple projections using inhaled aerosol Tc-47m DTPA. Perfusion images were obtained in multiple projections after intravenous injection of Tc-50m MAA. RADIOPHARMACEUTICALS:  33.3 mCi of Tc-31m DTPA aerosol inhalation and 4.3 mCi Tc70m MAA IV COMPARISON:  No prior nuclear imaging. Chest x-rays 12/10/2018, 12/05/2018 and earlier. FINDINGS: Ventilation: No  focal ventilatory defect. Ingested aerosol in the esophagus and stomach. Perfusion: Normal perfusion. No wedge-shaped perfusion defects to indicate pulmonary embolism. IMPRESSION: Normal examination. Electronically Signed   By: Evangeline Dakin M.D.   On: 12/11/2018 13:05   Dg Chest Port 1 View  Result Date: 12/10/2018 CLINICAL DATA:  Patient with shortness of breath. EXAM: PORTABLE CHEST 1 VIEW COMPARISON:  Chest radiograph 12/05/2018 FINDINGS: Monitoring leads overlie the patient. Stable cardiomegaly. Interval development of bilateral interstitial pulmonary opacities. No large pleural effusion or pneumothorax. IMPRESSION: Cardiomegaly and interstitial opacities favored to represent edema. Electronically Signed   By: Lovey Newcomer M.D.   On: 12/10/2018 15:32    Assessment and Plan:   Mike Decker is a 78 y.o. y/o male who comes in for follow-up have to have an EGD and colonoscopy.  The patient was found to have an AVM in the small bowel and an esophageal ulcer.  The patient now comes in for follow-up without any complaints. There is no report of any unexplained weight loss black stools or bloody stools.  The patient has a appointment with hematology for follow-up next week and will have his hemoglobin checked at that time.  The patient has been told  that if his anemia persists he may need a capsule endoscopy to look for further signs of AVMs in the small bowel.  The patient has been explained the plan and agrees with it.    [name], MD. Marval Regal   Note: This dictation was prepared with Dragon dictation along with smaller phrase technology. Any transcriptional errors that result from this process are unintentional.

## 2018-12-30 ENCOUNTER — Encounter: Payer: Self-pay | Admitting: Family

## 2018-12-30 ENCOUNTER — Ambulatory Visit: Payer: Medicare Other | Attending: Family | Admitting: Family

## 2018-12-30 ENCOUNTER — Other Ambulatory Visit: Payer: Self-pay

## 2018-12-30 ENCOUNTER — Encounter: Payer: Self-pay | Admitting: Oncology

## 2018-12-30 ENCOUNTER — Inpatient Hospital Stay: Payer: Medicare Other | Attending: Oncology | Admitting: Oncology

## 2018-12-30 ENCOUNTER — Inpatient Hospital Stay: Payer: Medicare Other

## 2018-12-30 VITALS — BP 172/76 | HR 57 | Temp 97.0°F | Resp 18 | Ht 66.0 in | Wt 194.8 lb

## 2018-12-30 VITALS — BP 178/64 | HR 54 | Resp 18 | Ht 66.0 in | Wt 194.5 lb

## 2018-12-30 DIAGNOSIS — M1990 Unspecified osteoarthritis, unspecified site: Secondary | ICD-10-CM | POA: Diagnosis not present

## 2018-12-30 DIAGNOSIS — N189 Chronic kidney disease, unspecified: Secondary | ICD-10-CM | POA: Insufficient documentation

## 2018-12-30 DIAGNOSIS — Z91010 Allergy to peanuts: Secondary | ICD-10-CM | POA: Insufficient documentation

## 2018-12-30 DIAGNOSIS — E785 Hyperlipidemia, unspecified: Secondary | ICD-10-CM | POA: Insufficient documentation

## 2018-12-30 DIAGNOSIS — I5033 Acute on chronic diastolic (congestive) heart failure: Secondary | ICD-10-CM | POA: Insufficient documentation

## 2018-12-30 DIAGNOSIS — Z87891 Personal history of nicotine dependence: Secondary | ICD-10-CM | POA: Insufficient documentation

## 2018-12-30 DIAGNOSIS — Z7902 Long term (current) use of antithrombotics/antiplatelets: Secondary | ICD-10-CM | POA: Insufficient documentation

## 2018-12-30 DIAGNOSIS — Z955 Presence of coronary angioplasty implant and graft: Secondary | ICD-10-CM | POA: Diagnosis not present

## 2018-12-30 DIAGNOSIS — D500 Iron deficiency anemia secondary to blood loss (chronic): Secondary | ICD-10-CM | POA: Insufficient documentation

## 2018-12-30 DIAGNOSIS — E119 Type 2 diabetes mellitus without complications: Secondary | ICD-10-CM | POA: Insufficient documentation

## 2018-12-30 DIAGNOSIS — H40059 Ocular hypertension, unspecified eye: Secondary | ICD-10-CM | POA: Insufficient documentation

## 2018-12-30 DIAGNOSIS — Z85828 Personal history of other malignant neoplasm of skin: Secondary | ICD-10-CM | POA: Diagnosis not present

## 2018-12-30 DIAGNOSIS — Z7982 Long term (current) use of aspirin: Secondary | ICD-10-CM | POA: Diagnosis not present

## 2018-12-30 DIAGNOSIS — I252 Old myocardial infarction: Secondary | ICD-10-CM | POA: Insufficient documentation

## 2018-12-30 DIAGNOSIS — Z881 Allergy status to other antibiotic agents status: Secondary | ICD-10-CM | POA: Diagnosis not present

## 2018-12-30 DIAGNOSIS — I10 Essential (primary) hypertension: Secondary | ICD-10-CM | POA: Insufficient documentation

## 2018-12-30 DIAGNOSIS — Z880 Allergy status to penicillin: Secondary | ICD-10-CM | POA: Diagnosis not present

## 2018-12-30 DIAGNOSIS — Z888 Allergy status to other drugs, medicaments and biological substances status: Secondary | ICD-10-CM | POA: Insufficient documentation

## 2018-12-30 DIAGNOSIS — Z794 Long term (current) use of insulin: Secondary | ICD-10-CM | POA: Insufficient documentation

## 2018-12-30 DIAGNOSIS — M109 Gout, unspecified: Secondary | ICD-10-CM | POA: Diagnosis not present

## 2018-12-30 DIAGNOSIS — E1151 Type 2 diabetes mellitus with diabetic peripheral angiopathy without gangrene: Secondary | ICD-10-CM | POA: Insufficient documentation

## 2018-12-30 DIAGNOSIS — Z91030 Bee allergy status: Secondary | ICD-10-CM | POA: Diagnosis not present

## 2018-12-30 DIAGNOSIS — Z8261 Family history of arthritis: Secondary | ICD-10-CM | POA: Insufficient documentation

## 2018-12-30 DIAGNOSIS — Z79899 Other long term (current) drug therapy: Secondary | ICD-10-CM | POA: Insufficient documentation

## 2018-12-30 DIAGNOSIS — G4733 Obstructive sleep apnea (adult) (pediatric): Secondary | ICD-10-CM | POA: Insufficient documentation

## 2018-12-30 DIAGNOSIS — I25110 Atherosclerotic heart disease of native coronary artery with unstable angina pectoris: Secondary | ICD-10-CM | POA: Insufficient documentation

## 2018-12-30 DIAGNOSIS — I5032 Chronic diastolic (congestive) heart failure: Secondary | ICD-10-CM | POA: Insufficient documentation

## 2018-12-30 DIAGNOSIS — E1122 Type 2 diabetes mellitus with diabetic chronic kidney disease: Secondary | ICD-10-CM | POA: Insufficient documentation

## 2018-12-30 DIAGNOSIS — E538 Deficiency of other specified B group vitamins: Secondary | ICD-10-CM

## 2018-12-30 DIAGNOSIS — I130 Hypertensive heart and chronic kidney disease with heart failure and stage 1 through stage 4 chronic kidney disease, or unspecified chronic kidney disease: Secondary | ICD-10-CM | POA: Diagnosis present

## 2018-12-30 DIAGNOSIS — Z887 Allergy status to serum and vaccine status: Secondary | ICD-10-CM | POA: Insufficient documentation

## 2018-12-30 DIAGNOSIS — E1165 Type 2 diabetes mellitus with hyperglycemia: Secondary | ICD-10-CM

## 2018-12-30 DIAGNOSIS — Z886 Allergy status to analgesic agent status: Secondary | ICD-10-CM | POA: Insufficient documentation

## 2018-12-30 DIAGNOSIS — I110 Hypertensive heart disease with heart failure: Secondary | ICD-10-CM | POA: Diagnosis not present

## 2018-12-30 LAB — CBC WITH DIFFERENTIAL/PLATELET
Abs Immature Granulocytes: 0.12 10*3/uL — ABNORMAL HIGH (ref 0.00–0.07)
Basophils Absolute: 0.1 10*3/uL (ref 0.0–0.1)
Basophils Relative: 1 %
Eosinophils Absolute: 0.6 10*3/uL — ABNORMAL HIGH (ref 0.0–0.5)
Eosinophils Relative: 6 %
HCT: 39.1 % (ref 39.0–52.0)
Hemoglobin: 12.5 g/dL — ABNORMAL LOW (ref 13.0–17.0)
Immature Granulocytes: 1 %
Lymphocytes Relative: 11 %
Lymphs Abs: 1 10*3/uL (ref 0.7–4.0)
MCH: 26.8 pg (ref 26.0–34.0)
MCHC: 32 g/dL (ref 30.0–36.0)
MCV: 83.9 fL (ref 80.0–100.0)
Monocytes Absolute: 0.9 10*3/uL (ref 0.1–1.0)
Monocytes Relative: 10 %
Neutro Abs: 6.3 10*3/uL (ref 1.7–7.7)
Neutrophils Relative %: 71 %
Platelets: 312 10*3/uL (ref 150–400)
RBC: 4.66 MIL/uL (ref 4.22–5.81)
RDW: 18.5 % — ABNORMAL HIGH (ref 11.5–15.5)
WBC: 8.9 10*3/uL (ref 4.0–10.5)
nRBC: 0 % (ref 0.0–0.2)

## 2018-12-30 LAB — RETIC PANEL
Immature Retic Fract: 13.9 % (ref 2.3–15.9)
RBC.: 4.66 MIL/uL (ref 4.22–5.81)
Retic Count, Absolute: 82.5 10*3/uL (ref 19.0–186.0)
Retic Ct Pct: 1.8 % (ref 0.4–3.1)
Reticulocyte Hemoglobin: 34.9 pg (ref >27.9)

## 2018-12-30 LAB — VITAMIN B12: Vitamin B-12: 294 pg/mL (ref 180–914)

## 2018-12-30 LAB — SAMPLE TO BLOOD BANK

## 2018-12-30 MED ORDER — FOLIC ACID 400 MCG PO TABS: 400.0000 ug | ORAL_TABLET | Freq: Every day | ORAL | 3 refills | Status: DC

## 2018-12-30 NOTE — Progress Notes (Signed)
Hematology/Oncology Follow Up Note [facility]  [phone] [phone]  Patient Care Team: Cletis Athens, MD as PCP - General (Cardiology) Cletis Athens, MD as Referring Physician (Cardiology) Christene Lye, MD (General Surgery) Alisa Graff, FNP as Nurse Practitioner (Family Medicine) Yolonda Kida, MD as Consulting Physician (Cardiology) Gabriel Carina, Betsey Holiday, MD as Physician Assistant (Endocrinology) Lavonia Dana, MD as Consulting Physician (Nephrology) Allyne Gee, MD as Consulting Physician (Pulmonary Disease) [name], MD as Consulting Physician (Gastroenterology)   Name of the patient: Mike Decker  [id number]  1941/09/25   REASON FOR VISIT  follow-up for management of iron deficiency anemia, anemia and CKD, recent hospitalization   INTERVAL HISTORY 78 y.o. male with PMH listed as below present to follow-up after recent hospitalization due to acute unstable angina, symptomatic anemia, GI bleed. He was evaluated by me during his hospitalizations. He received IV Venofer daily x3 as well as multiple units of PRBC transfusion. GI was consulted and patient had EGD done during his hospitalization.  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  Treated with argon plasma coagulation.  Colonoscopy showed polyps which were removed. Patient was discharged on 12/15/2018. Today he presents to clinic for follow-up.  Accompanied by wife and 2 sons. He reports fatigue has significantly improved since discharge.  Feeling well.  Denies any shortness of breath with exertion. Denies any pain today.  Review of Systems  Constitutional: Positive for fatigue. Negative for appetite change, chills, fever and unexpected weight change.  HENT:   Negative for hearing loss and voice change.   Eyes: Negative for eye problems and icterus.  Respiratory: Negative for chest tightness, cough and shortness of breath.   Cardiovascular:  Negative for chest pain and leg swelling.  Gastrointestinal: Negative for abdominal distention and abdominal pain.  Endocrine: Negative for hot flashes.  Genitourinary: Negative for difficulty urinating, dysuria and frequency.   Musculoskeletal: Negative for arthralgias.  Skin: Negative for itching and rash.  Neurological: Negative for light-headedness and numbness.  Hematological: Negative for adenopathy. Does not bruise/bleed easily.  Psychiatric/Behavioral: Negative for confusion.      Allergies  Allergen Reactions  . Gabapentin     Other reaction(s): Other (See Comments) Tremors  . Peanut-Containing Drug Products Anaphylaxis  . Penicillins Hives and Rash    Has patient had a PCN reaction causing immediate rash, facial/tongue/throat swelling, SOB or lightheadedness with hypotension: Yes Has patient had a PCN reaction causing severe rash involving mucus membranes or skin necrosis: No Has patient had a PCN reaction that required hospitalization No Has patient had a PCN reaction occurring within the last 10 years: No If all of the above answers are "NO", then may proceed with Cephalosporin use.  . Bee Venom Swelling  . Influenza Vaccines Hives  . Inh [Isoniazid] Hives  . Kenalog [Triamcinolone Acetonide] Hives  . Levaquin [Levofloxacin] Other (See Comments)    Tendon, ligament pain.   . Nalfon [Fenoprofen Calcium] Hives  . Naproxen   . Peanut Oil   . Nsaids Rash    Nalfon 600 Nalfon 600     Past Medical History:  Diagnosis Date  . Anemia   . Arthritis   . Atrioventricular canal (AVC)    irregular heart beats  . Barrett esophagus   . Cancer [facility]) 2002   prostate  . Chronic diastolic CHF (congestive heart failure) ([place])   . Colon polyp   . Diabetes mellitus without complication ([place])   . Diverticulosis   . Gout   .  Heart disease   . Hemangioma    liver  . Hyperlipidemia   . Hypertension   . Myocardial infarct ([place])   . Ocular hypertension   . Peripheral  vascular disease ([place])   . Skin cancer   . Sleep apnea   . Vitreoretinal degeneration      Past Surgical History:  Procedure Laterality Date  . CATARACT EXTRACTION  2011, 2012  . COLONOSCOPY N/A 12/14/2018   Procedure: COLONOSCOPY;  Surgeon: Virgel Manifold, MD;  Location: [facility] ENDOSCOPY;  Service: Endoscopy;  Laterality: N/A;  . CORONARY ANGIOPLASTY WITH STENT PLACEMENT  2012  . ESOPHAGOGASTRODUODENOSCOPY N/A 12/14/2018   Procedure: ESOPHAGOGASTRODUODENOSCOPY (EGD);  Surgeon: Virgel Manifold, MD;  Location: [facility] ENDOSCOPY;  Service: Endoscopy;  Laterality: N/A;  . ESOPHAGOGASTRODUODENOSCOPY (EGD) WITH PROPOFOL N/A 06/01/2016   Procedure: ESOPHAGOGASTRODUODENOSCOPY (EGD) WITH PROPOFOL;  Surgeon: Manya Silvas, MD;  Location: [facility] ENDOSCOPY;  Service: Endoscopy;  Laterality: N/A;  . HERNIA REPAIR     x2  . INTRAOCULAR LENS INSERTION    . LEFT HEART CATH AND CORONARY ANGIOGRAPHY N/A 05/09/2017   Procedure: Left Heart Cath and Coronary Angiography;  Surgeon: Yolonda Kida, MD;  Location: [place] CV LAB;  Service: Cardiovascular;  Laterality: N/A;  . LEFT HEART CATH AND CORONARY ANGIOGRAPHY N/A 12/08/2018   Procedure: LEFT HEART CATH AND CORONARY ANGIOGRAPHY and possible PCI and stent;  Surgeon: Yolonda Kida, MD;  Location: [place] CV LAB;  Service: Cardiovascular;  Laterality: N/A;  . NOSE SURGERY     submucous resection  . PROSTATE SURGERY  2002  . ROTATOR CUFF REPAIR Right     Social History   Socioeconomic History  . Marital status: Married    Spouse name: Not on file  . Number of children: Not on file  . Years of education: Not on file  . Highest education level: Not on file  Occupational History  . Not on file  Social Needs  . Financial resource strain: Not on file  . Food insecurity:    Worry: Not on file    Inability: Not on file  . Transportation needs:    Medical: Not on file    Non-medical: Not on file  Tobacco Use  . Smoking  status: Former Smoker    Years: 20.00    Types: Cigarettes    Last attempt to quit: 12/10/1976    Years since quitting: 42.0  . Smokeless tobacco: Never Used  Substance and Sexual Activity  . Alcohol use: No  . Drug use: No  . Sexual activity: Not on file  Lifestyle  . Physical activity:    Days per week: Not on file    Minutes per session: Not on file  . Stress: Not on file  Relationships  . Social connections:    Talks on phone: Not on file    Gets together: Not on file    Attends religious service: Not on file    Active member of club or organization: Not on file    Attends meetings of clubs or organizations: Not on file    Relationship status: Not on file  . Intimate partner violence:    Fear of current or ex partner: Not on file    Emotionally abused: Not on file    Physically abused: Not on file    Forced sexual activity: Not on file  Other Topics Concern  . Not on file  Social History Narrative  . Not on file  Family History  Problem Relation Age of Onset  . Arthritis Mother   . Stroke Maternal Grandfather      Current Outpatient Medications:  .  acetaminophen (TYLENOL) 500 MG tablet, Take 1,000 mg by mouth every 6 (six) hours as needed for mild pain, moderate pain or headache., Disp: , Rfl:  .  allopurinol (ZYLOPRIM) 100 MG tablet, Take 200 mg by mouth 2 (two) times daily. , Disp: , Rfl:  .  aspirin (ASPIRIN CHILDRENS) 81 MG chewable tablet, Chew 1 tablet (81 mg total) by mouth daily., Disp: 120 tablet, Rfl: 0 .  budesonide-formoterol (SYMBICORT) 160-4.5 MCG/ACT inhaler, Inhale 2 puffs into the lungs 2 (two) times daily., Disp: , Rfl:  .  cetirizine (ZYRTEC) 5 MG tablet, Take 5 mg by mouth daily as needed for allergies or rhinitis. , Disp: , Rfl:  .  citalopram (CELEXA) 10 MG tablet, Take 10 mg by mouth daily., Disp: , Rfl:  .  clopidogrel (PLAVIX) 75 MG tablet, Take 1 tablet (75 mg total) by mouth daily., Disp: 30 tablet, Rfl: 0 .  colchicine 0.6 MG tablet,  Take 1 tablet (0.6 mg total) by mouth daily. (Patient taking differently: Take 1.8 mg by mouth daily as needed (gout flares). ), Disp: 30 tablet, Rfl: 0 .  dextromethorphan-guaiFENesin (MUCINEX DM) 30-600 MG 12hr tablet, Take 1 tablet by mouth 2 (two) times daily as needed for cough., Disp: , Rfl:  .  EPINEPHrine (EPI-PEN) 0.3 mg/0.3 mL SOAJ injection, Inject into the muscle as needed (anaphylaxis). , Disp: , Rfl:  .  fluticasone (FLONASE) 50 MCG/ACT nasal spray, Place 2 sprays into both nostrils daily. , Disp: , Rfl: 3 .  furosemide (LASIX) 20 MG tablet, Take 2 tablets (40 mg total) by mouth daily., Disp: , Rfl:  .  insulin aspart (NOVOLOG FLEXPEN) 100 UNIT/ML FlexPen, Inject 14-24 Units into the skin See admin instructions. Inject 14u under the skin at breakfast, 24u at lunch and 24u under the skin at dinnertime according to sliding scale, Disp: , Rfl:  .  insulin glargine (LANTUS) 100 UNIT/ML injection, Inject 62 Units into the skin at bedtime. , Disp: , Rfl:  .  ipratropium-albuterol (DUONEB) 0.5-2.5 (3) MG/3ML SOLN, Take 3 mLs by nebulization every 4 (four) hours as needed., Disp: 360 mL, Rfl: 0 .  isosorbide mononitrate (IMDUR) 30 MG 24 hr tablet, Take 1 tablet (30 mg total) by mouth 2 (two) times daily., Disp: 60 tablet, Rfl: 0 .  magnesium gluconate (MAGONATE) 500 MG tablet, Take 500 mg by mouth 2 (two) times daily. , Disp: , Rfl:  .  methocarbamol (ROBAXIN) 500 MG tablet, Take 500 mg by mouth 2 (two) times daily. , Disp: , Rfl: 0 .  metoprolol tartrate (LOPRESSOR) 50 MG tablet, Take 75 mg by mouth 2 (two) times daily. , Disp: , Rfl:  .  nitroGLYCERIN (NITROSTAT) 0.4 MG SL tablet, Place 0.4 mg under the tongue every 5 (five) minutes x 3 doses as needed for chest pain. , Disp: , Rfl:  .  Omega-3 Fatty Acids (FISH OIL) 1000 MG CAPS, Take 1 capsule by mouth daily. , Disp: , Rfl:  .  pantoprazole (PROTONIX) 40 MG tablet, Take 40 mg by mouth 2 (two) times daily., Disp: , Rfl:  .  ranolazine  (RANEXA) 500 MG 12 hr tablet, Take 1 tablet (500 mg total) by mouth 2 (two) times daily., Disp: 60 tablet, Rfl: 1 .  rosuvastatin (CRESTOR) 40 MG tablet, Take 40 mg by mouth every evening. , Disp: ,  Rfl:  .  traMADol (ULTRAM) 50 MG tablet, Take 50 mg by mouth every 4 (four) hours as needed for moderate pain. , Disp: , Rfl:  .  Vitamin D, Ergocalciferol, (DRISDOL) 50000 units CAPS capsule, Take 50,000 Units by mouth every 30 (thirty) days. , Disp: , Rfl: 1 .  folic acid (V-R FOLIC ACID) 882 MCG tablet, Take 1 tablet (400 mcg total) by mouth daily., Disp: 90 tablet, Rfl: 3  Physical exam:  Vitals:   12/30/18 1041  BP: (!) 172/76  Pulse: (!) 57  Resp: 18  Temp: (!) 97 F (36.1 C)  TempSrc: Tympanic  Weight: 194 lb 12.8 oz (88.4 kg)  Height: '5\' 6"'  (1.676 m)   Physical Exam Constitutional:      General: He is not in acute distress. HENT:     Head: Normocephalic and atraumatic.  Eyes:     General: No scleral icterus.    Pupils: Pupils are equal, round, and reactive to light.  Neck:     Musculoskeletal: Normal range of motion and neck supple.  Cardiovascular:     Rate and Rhythm: Normal rate and regular rhythm.     Heart sounds: Normal heart sounds.  Pulmonary:     Effort: Pulmonary effort is normal. No respiratory distress.     Breath sounds: No wheezing.  Abdominal:     General: Bowel sounds are normal. There is no distension.     Palpations: Abdomen is soft. There is no mass.     Tenderness: There is no abdominal tenderness.  Musculoskeletal: Normal range of motion.        General: No deformity.  Skin:    General: Skin is warm and dry.     Findings: No erythema or rash.  Neurological:     Mental Status: He is alert and oriented to person, place, and time.     Cranial Nerves: No cranial nerve deficit.     Coordination: Coordination normal.  Psychiatric:        Behavior: Behavior normal.        Thought Content: Thought content normal.     CMP Latest Ref Rng & Units  12/15/2018  Glucose 70 - 99 mg/dL 184(H)  BUN 8 - 23 mg/dL 28(H)  Creatinine 0.61 - 1.24 mg/dL 1.66(H)  Sodium 135 - 145 mmol/L 135  Potassium 3.5 - 5.1 mmol/L 4.3  Chloride 98 - 111 mmol/L 107  CO2 22 - 32 mmol/L 20(L)  Calcium 8.9 - 10.3 mg/dL 8.3(L)  Total Protein 6.5 - 8.1 g/dL -  Total Bilirubin 0.3 - 1.2 mg/dL -  Alkaline Phos 38 - 126 U/L -  AST 15 - 41 U/L -  ALT 17 - 63 U/L -   CBC Latest Ref Rng & Units 12/30/2018  WBC 4.0 - 10.5 K/uL 8.9  Hemoglobin 13.0 - 17.0 g/dL 12.5(L)  Hematocrit 39.0 - 52.0 % 39.1  Platelets 150 - 400 K/uL 312    Dg Chest 2 View  Result Date: 12/05/2018 CLINICAL DATA:  Chest pain began this morning EXAM: CHEST - 2 VIEW COMPARISON:  Chest x-ray of 11/16/2017 FINDINGS: No active infiltrate or effusion is seen. Mediastinal and hilar contours appear stable. Cardiomegaly is stable. No acute bony abnormality is seen. Coronary artery stent is noted. IMPRESSION: Stable cardiomegaly.  No active lung disease. Electronically Signed   By: Ivar Drape M.D.   On: 12/05/2018 10:47   Ct Chest High Resolution  Result Date: 12/11/2018 CLINICAL DATA:  Inpatient. Hemoptysis. Dyspnea. Generalized weakness.  Hypoxia. EXAM: CT CHEST WITHOUT CONTRAST TECHNIQUE: Multidetector CT imaging of the chest was performed following the standard protocol without intravenous contrast. High resolution imaging of the lungs, as well as inspiratory and expiratory imaging, was performed. COMPARISON:  05/06/2017 chest CT angiogram. Chest radiograph from one day prior. FINDINGS: Cardiovascular: Top-normal heart size. No significant pericardial effusion/thickening. Three-vessel coronary atherosclerosis. Aortic valvular calcification. Atherosclerotic nonaneurysmal thoracic aorta. Normal caliber pulmonary arteries. Mediastinum/Nodes: No discrete thyroid nodules. Unremarkable esophagus. No axillary adenopathy. Mildly enlarged 1.2 cm subcarinal node (series 2/image 73), unchanged since 05/06/2017 chest  CT. No new pathologically enlarged mediastinal nodes. No discrete hilar adenopathy on this noncontrast scan. Lungs/Pleura: No pneumothorax. Small dependent bilateral pleural effusions. No acute consolidative airspace disease, lung masses or significant pulmonary nodules. Scattered small calcified granulomas throughout both lungs. No significant air trapping on the expiration sequence. There is patchy ground-glass opacity and interlobular septal thickening throughout both lungs, most prominent in the dependent lower lungs. There is mild patchy subpleural reticulation with minimal traction bronchiolectasis in both lungs with a mild basilar predominance. No frank honeycombing. Upper abdomen: Small hiatal hernia. Hypodense 3.0 cm right liver dome mass (series 2/image 103), unchanged since 03/29/2014 CT, where it was seen to represent a hemangioma. Musculoskeletal: No aggressive appearing focal osseous lesions. Mild thoracic spondylosis. IMPRESSION: 1. Aortic valvular calcification, which can be correlated with aortic valvular stenosis. Consider echocardiographic correlation. Top-normal heart size. 2. Small dependent bilateral pleural effusions. 3. Patchy ground-glass opacity and interlobular septal thickening, most prominent in the dependent lower lungs. Mild patchy subpleural reticulation with minimal traction bronchiolectasis, with a mild basilar predominance. No honeycombing. 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 A follow-up high-resolution chest CT study could be obtained in 12 months to assess temporal pattern stability, as clinically warranted. 4. Three-vessel coronary atherosclerosis. 5. Small hiatal hernia. Aortic Atherosclerosis (ICD10-I70.0). Electronically Signed   By: Ilona Sorrel M.D.   On: 12/11/2018  20:26   Nm Pulmonary Perf And Vent  Result Date: 12/11/2018 CLINICAL DATA:  Acute onset of shortness of breath. Hemoptysis. Chronic renal failure precludes IV contrast administration. EXAM: NUCLEAR MEDICINE VENTILATION - PERFUSION LUNG SCAN TECHNIQUE: Ventilation images were obtained in multiple projections using inhaled aerosol Tc-48mDTPA. Perfusion images were obtained in multiple projections after intravenous injection of Tc-953mAA. RADIOPHARMACEUTICALS:  33.3 mCi of Tc-999mPA aerosol inhalation and 4.3 mCi Tc99m41m IV COMPARISON:  No prior nuclear imaging. Chest x-rays 12/10/2018, 12/05/2018 and earlier. FINDINGS: Ventilation: No focal ventilatory defect. Ingested aerosol in the esophagus and stomach. Perfusion: Normal perfusion. No wedge-shaped perfusion defects to indicate pulmonary embolism. IMPRESSION: Normal examination. Electronically Signed   By: ThomEvangeline Dakin.   On: 12/11/2018 13:05   Dg Chest Port 1 View  Result Date: 12/10/2018 CLINICAL DATA:  Patient with shortness of breath. EXAM: PORTABLE CHEST 1 VIEW COMPARISON:  Chest radiograph 12/05/2018 FINDINGS: Monitoring leads overlie the patient. Stable cardiomegaly. Interval development of bilateral interstitial pulmonary opacities. No large pleural effusion or pneumothorax. IMPRESSION: Cardiomegaly and interstitial opacities favored to represent edema. Electronically Signed   By: DrewLovey Newcomer.   On: 12/10/2018 15:32     Assessment and plan  1. Iron deficiency anemia due to chronic blood loss   2. B12 deficiency   3. Chronic kidney disease, unspecified CKD stage    Check cbc, retic panel today to evaluate if hemoglobin is stable  and if need additional IV iron.  Labs reviewed hemoglobin has improved to 12.5.  Reticulocyte hemoglobin normal indicating adequate iron stores.  We will hold additional IV iron at this point.    Chronic kidney disease, recommend to check multiple myeloma panel. Vitamin B12 deficiency, repeat  vitamin B12 today.  Check intrinsic factor antibody and antiparietal antibody.   Orders Placed This Encounter  Procedures  . CBC with Differential/Platelet    Standing Status:   Future    Number of Occurrences:   1    Standing Expiration Date:   12/31/2019  . Retic Panel    Standing Status:   Future    Number of Occurrences:   1    Standing Expiration Date:   12/31/2019  . Iron and TIBC    Standing Status:   Future    Standing Expiration Date:   12/31/2019  . CBC with Differential/Platelet    Standing Status:   Future    Standing Expiration Date:   12/31/2019  . Ferritin    Standing Status:   Future    Standing Expiration Date:   12/31/2019  . Comprehensive metabolic panel    Standing Status:   Future    Standing Expiration Date:   12/31/2019  . Vitamin B12    Standing Status:   Future    Number of Occurrences:   1    Standing Expiration Date:   12/31/2019  . Intrinsic Factor Antibodies    Standing Status:   Future    Number of Occurrences:   1    Standing Expiration Date:   12/31/2019  . Anti-parietal antibody    Standing Status:   Future    Number of Occurrences:   1    Standing Expiration Date:   12/31/2019  . Multiple Myeloma Panel (SPEP&IFE w/QIG)    Standing Status:   Future    Number of Occurrences:   1    Standing Expiration Date:   12/30/2019  . Kappa/lambda light chains    Standing Status:   Future    Number of Occurrences:   1    Standing Expiration Date:   12/30/2019  . Hold Tube- Blood Bank    Standing Status:   Future    Number of Occurrences:   1    Standing Expiration Date:   12/31/2019      Follow-up in 3 months.  Check CBC CMP and B12, iron TIBC ferritin Total face to face encounter time for this patient visit was 25 min. >50% of the time was  spent in counseling and coordination of care.  Earlie Server, MD, PhD Hematology Oncology [facility] at [facility] Pager- [phone] 12/30/2018

## 2018-12-30 NOTE — Progress Notes (Signed)
Patient here for initial visit. Pt's blood pressure elevated, denies lightheadedness, dizziness or headache.

## 2018-12-30 NOTE — Patient Instructions (Signed)
Continue weighing daily and call for an overnight weight gain of > 2 pounds or a weekly weight gain of >5 pounds. 

## 2018-12-30 NOTE — Progress Notes (Signed)
Patient ID: Mike Decker, male    DOB: [birth year], 78 y.o.   MRN: [id number]  HPI  Mr Gorin is a 78 y/o male with a history of DM, anemia, hyperlipidemia, HTN, obstructive sleep apnea, PVD, previous tobacco use and chronic heart failure.   Echo report from 11/15/18 reviewed and showed an EF of 55-60% along with mild AS and mild/moderate MR.   Cardiac catheterization done 12/08/18 showed:   Successful cardiac cath with mild to moderate diffuse coronary disease throughout No significant high-grade obstructive coronary disease Patient appears to have possible microvascular disease LV gram was deferred because of renal insufficiency  Admitted 12/05/18 due to acute unstable angina. GI, pulmonology, oncology and cardiology consults were obtained. Treated with heparin drip, aspirin, plavix, nitrates and beta-blockers. Catheterization done. Needed 3 iron infusions along with 1 unit of PRBC's due to anemia. Discharged after 10 days. Admitted 11/14/18 due to NSTEMI with a troponin of 6. Cardiology consult obtained. Unable to cath due to renal issues. Diuresed due to acute on chronic HF. Discharged after 3 days.   He presents today for his initial visit with a chief complaint of minimal shortness of breath upon moderate exertion. He describes this as chronic in nature having been present for several years. He has associated head congestion and chronic difficulty sleeping along with this. He denies any dizziness, abdominal distention, palpitations, pedal edema, chest pain, fatigue or weight gain.   Past Medical History:  Diagnosis Date  . Anemia   . Arthritis   . Atrioventricular canal (AVC)    irregular heart beats  . Barrett esophagus   . Cancer [facility]) 2002   prostate  . Chronic diastolic CHF (congestive heart failure) ([place])   . Colon polyp   . Diabetes mellitus without complication ([place])   . Diverticulosis   . Gout   . Heart disease   . Hemangioma    liver  . Hyperlipidemia   .  Hypertension   . Myocardial infarct ([place])   . Ocular hypertension   . Peripheral vascular disease ([place])   . Skin cancer   . Sleep apnea   . Vitreoretinal degeneration    Past Surgical History:  Procedure Laterality Date  . CATARACT EXTRACTION  2011, 2012  . COLONOSCOPY N/A 12/14/2018   Procedure: COLONOSCOPY;  Surgeon: Virgel Manifold, MD;  Location: [facility] ENDOSCOPY;  Service: Endoscopy;  Laterality: N/A;  . CORONARY ANGIOPLASTY WITH STENT PLACEMENT  2012  . ESOPHAGOGASTRODUODENOSCOPY N/A 12/14/2018   Procedure: ESOPHAGOGASTRODUODENOSCOPY (EGD);  Surgeon: Virgel Manifold, MD;  Location: [facility] ENDOSCOPY;  Service: Endoscopy;  Laterality: N/A;  . ESOPHAGOGASTRODUODENOSCOPY (EGD) WITH PROPOFOL N/A 06/01/2016   Procedure: ESOPHAGOGASTRODUODENOSCOPY (EGD) WITH PROPOFOL;  Surgeon: Manya Silvas, MD;  Location: [facility] ENDOSCOPY;  Service: Endoscopy;  Laterality: N/A;  . HERNIA REPAIR     x2  . INTRAOCULAR LENS INSERTION    . LEFT HEART CATH AND CORONARY ANGIOGRAPHY N/A 05/09/2017   Procedure: Left Heart Cath and Coronary Angiography;  Surgeon: Yolonda Kida, MD;  Location: [facility] CV LAB;  Service: Cardiovascular;  Laterality: N/A;  . LEFT HEART CATH AND CORONARY ANGIOGRAPHY N/A 12/08/2018   Procedure: LEFT HEART CATH AND CORONARY ANGIOGRAPHY and possible PCI and stent;  Surgeon: Yolonda Kida, MD;  Location: [place] CV LAB;  Service: Cardiovascular;  Laterality: N/A;  . NOSE SURGERY     submucous resection  . PROSTATE SURGERY  2002  . ROTATOR CUFF REPAIR Right    Family History  Problem  Relation Age of Onset  . Arthritis Mother   . Stroke Maternal Grandfather    Social History   Tobacco Use  . Smoking status: Former Smoker    Years: 20.00    Types: Cigarettes    Last attempt to quit: 12/10/1976    Years since quitting: 42.0  . Smokeless tobacco: Never Used  Substance Use Topics  . Alcohol use: No   Allergies  Allergen Reactions  . Gabapentin      Other reaction(s): Other (See Comments) Tremors  . Peanut-Containing Drug Products Anaphylaxis  . Penicillins Hives and Rash    Has patient had a PCN reaction causing immediate rash, facial/tongue/throat swelling, SOB or lightheadedness with hypotension: Yes Has patient had a PCN reaction causing severe rash involving mucus membranes or skin necrosis: No Has patient had a PCN reaction that required hospitalization No Has patient had a PCN reaction occurring within the last 10 years: No If all of the above answers are "NO", then may proceed with Cephalosporin use.  . Bee Venom Swelling  . Influenza Vaccines Hives  . Inh [Isoniazid] Hives  . Kenalog [Triamcinolone Acetonide] Hives  . Levaquin [Levofloxacin] Other (See Comments)    Tendon, ligament pain.   . Nalfon [Fenoprofen Calcium] Hives  . Naproxen   . Peanut Oil   . Nsaids Rash    Nalfon 600 Nalfon 600   Prior to Admission medications   Medication Sig Start Date End Date Taking? Authorizing Provider  acetaminophen (TYLENOL) 500 MG tablet Take 1,000 mg by mouth every 6 (six) hours as needed for mild pain, moderate pain or headache.   Yes [provider]  allopurinol (ZYLOPRIM) 100 MG tablet Take 200 mg by mouth 2 (two) times daily.    Yes [provider]  aspirin (ASPIRIN CHILDRENS) 81 MG chewable tablet Chew 1 tablet (81 mg total) by mouth daily. 10/09/15  Yes Mody, Ulice Bold, MD  budesonide-formoterol (SYMBICORT) 160-4.5 MCG/ACT inhaler Inhale 2 puffs into the lungs 2 (two) times daily.   Yes [provider]  cetirizine (ZYRTEC) 5 MG tablet Take 5 mg by mouth daily as needed for allergies or rhinitis.    Yes [provider]  citalopram (CELEXA) 10 MG tablet Take 10 mg by mouth daily.   Yes [provider]  clopidogrel (PLAVIX) 75 MG tablet Take 1 tablet (75 mg total) by mouth daily. 10/09/15  Yes Mody, Ulice Bold, MD  colchicine 0.6 MG tablet Take 1 tablet (0.6 mg total) by mouth daily. Patient  taking differently: Take 1.8 mg by mouth daily as needed (gout flares).  11/17/18  Yes Mody, Ulice Bold, MD  dextromethorphan-guaiFENesin (MUCINEX DM) 30-600 MG 12hr tablet Take 1 tablet by mouth 2 (two) times daily as needed for cough.   Yes [provider]  EPINEPHrine (EPI-PEN) 0.3 mg/0.3 mL SOAJ injection Inject into the muscle as needed (anaphylaxis).    Yes [provider]  fluticasone (FLONASE) 50 MCG/ACT nasal spray Place 2 sprays into both nostrils daily.  11/04/18  Yes [provider]  folic acid (V-R FOLIC ACID) 725 MCG tablet Take 1 tablet (400 mcg total) by mouth daily. 12/30/18  Yes Earlie Server, MD  furosemide (LASIX) 20 MG tablet Take 2 tablets (40 mg total) by mouth daily. 11/17/18  Yes Mody, Ulice Bold, MD  insulin aspart (NOVOLOG FLEXPEN) 100 UNIT/ML FlexPen Inject 14-24 Units into the skin See admin instructions. Inject 14u under the skin at breakfast, 24u at lunch and 24u under the skin at dinnertime according to sliding  scale   Yes [provider]  insulin glargine (LANTUS) 100 UNIT/ML injection Inject 62 Units into the skin at bedtime.    Yes [provider]  ipratropium-albuterol (DUONEB) 0.5-2.5 (3) MG/3ML SOLN Take 3 mLs by nebulization every 4 (four) hours as needed. 05/15/17  Yes Hillary Bow, MD  isosorbide mononitrate (IMDUR) 30 MG 24 hr tablet Take 1 tablet (30 mg total) by mouth 2 (two) times daily. 12/15/18  Yes Demetrios Loll, MD  magnesium gluconate (MAGONATE) 500 MG tablet Take 500 mg by mouth 2 (two) times daily.    Yes [provider]  methocarbamol (ROBAXIN) 500 MG tablet Take 500 mg by mouth 2 (two) times daily.  11/08/18  Yes [provider]  metoprolol tartrate (LOPRESSOR) 50 MG tablet Take 75 mg by mouth 2 (two) times daily.    Yes [provider]  nitroGLYCERIN (NITROSTAT) 0.4 MG SL tablet Place 0.4 mg under the tongue every 5 (five) minutes x 3 doses as needed for chest pain.    Yes [provider]   Omega-3 Fatty Acids (FISH OIL) 1000 MG CAPS Take 1 capsule by mouth daily.    Yes [provider]  pantoprazole (PROTONIX) 40 MG tablet Take 40 mg by mouth 2 (two) times daily.   Yes [provider]  ranolazine (RANEXA) 500 MG 12 hr tablet Take 1 tablet (500 mg total) by mouth 2 (two) times daily. 12/15/18  Yes Demetrios Loll, MD  rosuvastatin (CRESTOR) 40 MG tablet Take 40 mg by mouth every evening.    Yes [provider]  traMADol (ULTRAM) 50 MG tablet Take 50 mg by mouth every 4 (four) hours as needed for moderate pain.    Yes [provider]  Vitamin D, Ergocalciferol, (DRISDOL) 50000 units CAPS capsule Take 50,000 Units by mouth every 30 (thirty) days.    Yes [provider]    Review of Systems  Constitutional: Negative for appetite change and fatigue.  HENT: Positive for congestion. Negative for postnasal drip and sore throat.   Eyes: Negative.   Respiratory: Positive for shortness of breath. Negative for chest tightness.   Cardiovascular: Negative for chest pain, palpitations and leg swelling.  Gastrointestinal: Negative for abdominal distention and abdominal pain.  Endocrine: Negative.   Genitourinary: Negative.   Musculoskeletal: Negative for back pain and neck pain.  Skin: Negative.   Allergic/Immunologic: Negative.   Neurological: Negative for dizziness and light-headedness.  Hematological: Negative for adenopathy. Does not bruise/bleed easily.  Psychiatric/Behavioral: Positive for sleep disturbance (wearing CPAP; sleeping on 1 pillow). Negative for dysphoric mood. The patient is not nervous/anxious.    Vitals:   12/30/18 1156  BP: (!) 178/64  Pulse: (!) 54  Resp: 18  SpO2: 100%  Weight: 194 lb 8 oz (88.2 kg)  Height: 5\' 6"  (1.676 m)   Wt Readings from Last 3 Encounters:  12/30/18 194 lb 8 oz (88.2 kg)  12/30/18 194 lb 12.8 oz (88.4 kg)  12/25/18 192 lb 9.6 oz (87.4 kg)   Lab Results  Component Value Date   CREATININE 1.66  (H) 12/15/2018   CREATININE 1.92 (H) 12/14/2018   CREATININE 2.61 (H) 12/13/2018   Physical Exam Vitals signs and nursing note reviewed.  Constitutional:      Appearance: Normal appearance.  HENT:     Head: Normocephalic and atraumatic.  Neck:     Musculoskeletal: Normal range of motion and neck supple.  Cardiovascular:     Rate and Rhythm: Regular rhythm. Bradycardia present.  Pulmonary:  Effort: Pulmonary effort is normal.     Breath sounds: Normal breath sounds. No wheezing or rales.  Abdominal:     General: There is no distension.     Palpations: Abdomen is soft.  Musculoskeletal:        General: No tenderness.     Right lower leg: No edema.     Left lower leg: No edema.  Skin:    General: Skin is warm and dry.  Neurological:     General: No focal deficit present.     Mental Status: He is alert and oriented to person, place, and time.  Psychiatric:        Mood and Affect: Mood normal.        Behavior: Behavior normal.    Assessment & Plan:  1: Chronic heart failure with preserved ejection fraction- - NYHA class II - euvolemic today - weighing daily and he was reminded to call for an overnight weight gain of >2 pounds or a weekly weight gain of >5 pounds - adds salt to his food when he cooks but doesn't add any after. Doesn't like the taste of Mrs. Dash. Reviewed the importance of closely following a 2000mg  sodium diet and written dietary information was given to him about this.  - saw cardiology Clayborn Bigness) 11/27/18 and returns tomorrow; he says that both Dr. Clayborn Bigness and Dr. Devona Konig both mentioned patient getting a 2nd opinion regarding cardiology status. He says that Dr. Humphrey Rolls told him that if Dr. Clayborn Bigness didn't make the referral that he would make the referral.  - he doesn't take the flu vaccine due to allergy; encouraged diligent handwashing  2: HTN- - BP elevated today but he says that it normally is ok - sees PCP [facility]) but hasn't seen him recently -  BMP from 12/15/2018 reviewed and showed sodium 135, potassium 4.3, creatinine 1.66 and GFR 39  3: DM with CKD- - glucose this morning was 230 - saw endocrinologist (Solum) 09/16/18 - A1c on 11/15/18 was 7.2% - sees nephrologist (Kolluru) later this month  4: Obstructive sleep apnea- - wears CPAP nightly but says that he doesn't sleep well - worked night shift for 12 years - saw pulmonologist Humphrey Rolls) 12/22/2018  Medication list was reviewed.  Return in 2 months or sooner for any questions/problems before then.

## 2018-12-31 DIAGNOSIS — I739 Peripheral vascular disease, unspecified: Secondary | ICD-10-CM | POA: Diagnosis not present

## 2018-12-31 DIAGNOSIS — I639 Cerebral infarction, unspecified: Secondary | ICD-10-CM | POA: Diagnosis not present

## 2018-12-31 DIAGNOSIS — R001 Bradycardia, unspecified: Secondary | ICD-10-CM | POA: Diagnosis not present

## 2018-12-31 DIAGNOSIS — R0602 Shortness of breath: Secondary | ICD-10-CM | POA: Diagnosis not present

## 2018-12-31 DIAGNOSIS — E7849 Other hyperlipidemia: Secondary | ICD-10-CM | POA: Diagnosis not present

## 2018-12-31 DIAGNOSIS — I2510 Atherosclerotic heart disease of native coronary artery without angina pectoris: Secondary | ICD-10-CM | POA: Diagnosis not present

## 2018-12-31 DIAGNOSIS — I10 Essential (primary) hypertension: Secondary | ICD-10-CM | POA: Diagnosis not present

## 2018-12-31 DIAGNOSIS — I509 Heart failure, unspecified: Secondary | ICD-10-CM | POA: Diagnosis not present

## 2018-12-31 DIAGNOSIS — I214 Non-ST elevation (NSTEMI) myocardial infarction: Secondary | ICD-10-CM | POA: Diagnosis not present

## 2018-12-31 DIAGNOSIS — N182 Chronic kidney disease, stage 2 (mild): Secondary | ICD-10-CM | POA: Diagnosis not present

## 2018-12-31 DIAGNOSIS — K219 Gastro-esophageal reflux disease without esophagitis: Secondary | ICD-10-CM | POA: Diagnosis not present

## 2018-12-31 DIAGNOSIS — I208 Other forms of angina pectoris: Secondary | ICD-10-CM | POA: Diagnosis not present

## 2018-12-31 LAB — KAPPA/LAMBDA LIGHT CHAINS
KAPPA FREE LGHT CHN: 40.7 mg/L — AB (ref 3.3–19.4)
Kappa, lambda light chain ratio: 1.46 (ref 0.26–1.65)
LAMDA FREE LIGHT CHAINS: 27.8 mg/L — AB (ref 5.7–26.3)

## 2018-12-31 LAB — INTRINSIC FACTOR ANTIBODIES: Intrinsic Factor: 1.1 AU/mL (ref 0.0–1.1)

## 2018-12-31 LAB — ANTI-PARIETAL ANTIBODY: Parietal Cell Antibody-IgG: 2.9 Units (ref 0.0–20.0)

## 2019-01-01 LAB — MULTIPLE MYELOMA PANEL, SERUM
Albumin SerPl Elph-Mcnc: 4.2 g/dL (ref 2.9–4.4)
Albumin/Glob SerPl: 1.6 (ref 0.7–1.7)
Alpha 1: 0.2 g/dL (ref 0.0–0.4)
Alpha2 Glob SerPl Elph-Mcnc: 1.1 g/dL — ABNORMAL HIGH (ref 0.4–1.0)
B-Globulin SerPl Elph-Mcnc: 0.9 g/dL (ref 0.7–1.3)
Gamma Glob SerPl Elph-Mcnc: 0.5 g/dL (ref 0.4–1.8)
Globulin, Total: 2.7 g/dL (ref 2.2–3.9)
IGA: 89 mg/dL (ref 61–437)
IgG (Immunoglobin G), Serum: 594 mg/dL — ABNORMAL LOW (ref 700–1600)
IgM (Immunoglobulin M), Srm: 43 mg/dL (ref 15–143)
Total Protein ELP: 6.9 g/dL (ref 6.0–8.5)

## 2019-01-05 ENCOUNTER — Telehealth: Payer: Self-pay | Admitting: *Deleted

## 2019-01-05 DIAGNOSIS — I129 Hypertensive chronic kidney disease with stage 1 through stage 4 chronic kidney disease, or unspecified chronic kidney disease: Secondary | ICD-10-CM | POA: Diagnosis not present

## 2019-01-05 DIAGNOSIS — N2581 Secondary hyperparathyroidism of renal origin: Secondary | ICD-10-CM | POA: Diagnosis not present

## 2019-01-05 DIAGNOSIS — R809 Proteinuria, unspecified: Secondary | ICD-10-CM | POA: Diagnosis not present

## 2019-01-05 DIAGNOSIS — E1122 Type 2 diabetes mellitus with diabetic chronic kidney disease: Secondary | ICD-10-CM | POA: Diagnosis not present

## 2019-01-05 DIAGNOSIS — N183 Chronic kidney disease, stage 3 (moderate): Secondary | ICD-10-CM | POA: Diagnosis not present

## 2019-01-05 NOTE — Telephone Encounter (Signed)
Patient called asking for results from last week  Next appointment not until April ...   Ref Range & Units 6d ago  Kappa free light chain 3.3 - 19.4 mg/L 40.7High    Lamda free light chains 5.7 - 26.3 mg/L 27.8High    Kappa, lamda light chain ratio 0.26 - 1.65 1.46   Comment: (NOTE)  Performed At: [facility]  [place], [place] [id number]  Rush Farmer MD BZ:[id number]   Resulting Agency  [facility] CLIN LAB      Specimen Collected: 12/30/18 11:31  Last Resulted: 12/31/18 13:37       Contains abnormal data Multiple Myeloma Panel (SPEP&IFE w/QIG)  Order: [id number]   Status:  Edited Result - FINAL  Visible to patient:  No (Not Released)  Next appt:  02/24/2019 at 10:20 AM in Cardiology Alisa Graff, FNP)  Dx:  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   Ref Range & Units 6d ago  IgG (Immunoglobin G), Serum 700 - 1,600 mg/dL 594Low    IgA 61 - 437 mg/dL 89   IgM (Immunoglobulin M), Srm 15 - 143 mg/dL 43   Total Protein ELP 6.0 - 8.5 g/dL 6.9 VC  Albumin SerPl Elph-Mcnc 2.9 - 4.4 g/dL 4.2 VC  Alpha 1 0.0 - 0.4 g/dL 0.2 VC  Alpha2 Glob SerPl Elph-Mcnc 0.4 - 1.0 g/dL 1.1High  VC  B-Globulin SerPl Elph-Mcnc 0.7 - 1.3 g/dL 0.9 VC  Gamma Glob SerPl Elph-Mcnc 0.4 - 1.8 g/dL 0.5 VC  M Protein SerPl Elph-Mcnc Not Observed g/dL Not Observed VC  Globulin, Total 2.2 - 3.9 g/dL 2.7 VC  Albumin/Glob SerPl 0.7 - 1.7 1.6 VC  IFE 1  Comment VC  Comment: An apparent normal immunofixation pattern.  Please Note  Comment VC  Comment: (NOTE)  Protein electrophoresis scan will follow via computer, mail, or  courier delivery.  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   Ref Range & Units 6d ago  Parietal Cell Antibody-IgG 0.0 - 20.0 Units 2.9   Comment: (NOTE)                 Negative  0.0 - 20.0                 Equivocal 20.1 - 24.9                 Positive     >24.9  Parietal Cell Antibodies are found in 90% of patients  with pernicious anemia and 30% of first degree  relatives with pernicious anemia.  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   Ref Range & Units 6d ago  Intrinsic Factor 0.0 - 1.1 AU/mL 1.1   Comment: (NOTE)  Performed At: [facility]  [address] [place], [place] [id number]  Rush Farmer MD [id number]   Resulting Agency  [facility] CLIN LAB      Specimen Collected: 12/30/18 11:31  Last Resulted: 12/31/18 14:36     Lab Flowsheet    Order Details    View Encounter    Lab and Collection Details    Routing    Result History            Vitamin B12  Order: [id number]   Status:  Final result  Visible to patient:  No (Not Released)  Next appt:  02/24/2019 at 10:20 AM in Cardiology Alisa Graff, FNP)  Dx:  B12 deficiency   Ref Range &  Units 6d ago 81moago  Vitamin B-12 180 - 914 pg/mL 294  173Low  CM  Comment: (NOTE)  This assay is not validated for testing neonatal or  myeloproliferative syndrome specimens for Vitamin B12 levels.  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   Ref Range & Units 6d ago 149mogo  Retic Ct Pct 0.4 - 3.1 % 1.8  1.2   RBC. 4.22 - 5.81 MIL/uL 4.66  3.48Low    Retic Count, Absolute 19.0 - 186.0 K/uL 82.5  42.8   Immature Retic Fract 2.3 - 15.9 % 13.9  17.7High  CM  Reticulocyte Hemoglobin >27.9 pg 34.9    Comment:     Given the high negative predictive  value of a RET-He result > 32 pg  iron deficiency is essentially  excluded. If this patient is anemic  other etiologies should be  considered.  Performed at [facility] [id number] Resulting Agency  [facility]      Specimen Collected: 12/30/18 11:31  Last Resulted: 12/30/18 11:53     Lab Flowsheet    Order Details    View Encounter    Lab and Collection Details    Routing    Result History      CM=Additional comments        Contains abnormal data CBC with Differential/Platelet  Order: [id number]  Status:  Final result  Visible to patient:  No (Not Released)  Next appt:  02/24/2019 at 10:20 AM in Cardiology Alisa Graff, [place])  Dx:  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   Ref Range & Units 6d ago (12/30/18) 3wk ago (12/15/18)  3wk ago (12/14/18) 3wk ago (12/13/18) 3wk ago (12/12/18) 3wk ago (12/11/18) 3wk ago (12/10/18)  WBC 4.0 - 10.5 K/uL 8.9         RBC 4.22 - 5.81 MIL/uL 4.66         Hemoglobin 13.0 - 17.0 g/dL 12.5Low   10.8Low  CM 11.2Low  CM 7.6Low  CM 8.0Low  CM 8.3Low  CM 8.7Low  CM  HCT 39.0 - 52.0 % 39.1   35.7Low  CM      MCV 80.0 - 100.0 fL 83.9         MCH 26.0 - 34.0 pg 26.8         MCHC 30.0 - 36.0 g/dL 32.0         RDW 11.5 - 15.5 % 18.5High          Platelets 150 - 400 K/uL 312         nRBC 0.0 - 0.2 % 0.0         Neutrophils Relative % % 71         Neutro Abs 1.7 - 7.7 K/uL 6.3         Lymphocytes Relative % 11         Lymphs Abs 0.7 - 4.0 K/uL 1.0         Monocytes Relative % 10         Monocytes Absolute 0.1 - 1.0 K/uL 0.9         Eosinophils Relative % 6         Eosinophils Absolute 0.0 - 0.5 K/uL 0.6High          Basophils Relative % 1         Basophils Absolute 0.0 - 0.1 K/uL 0.1         Immature Granulocytes % 1         Abs Immature Granulocytes 0.00 - 0.07 K/uL 0.12High          Comment: Performed at [facility], [address] [place], [place] [address]  Resulting Agency  _0  [facility] CLIN LAB [facility] CLIN LAB      Specimen Collected: 12/30/18 11:31  Last Resulted: 12/30/18 11:54

## 2019-01-06 NOTE — Telephone Encounter (Signed)
Call returned to wife and advised of physician response She wrote down the B12 1000 mcg daily and stated alright, she then thanked me and asked for the hgb value and told her 12.5.

## 2019-01-06 NOTE — Telephone Encounter (Signed)
Please let patient know that his labs are fine. Recommend patient to take oral vitamin B12 1055mcg daily. Thanks.

## 2019-01-08 DIAGNOSIS — G4733 Obstructive sleep apnea (adult) (pediatric): Secondary | ICD-10-CM | POA: Insufficient documentation

## 2019-01-08 DIAGNOSIS — J449 Chronic obstructive pulmonary disease, unspecified: Secondary | ICD-10-CM | POA: Insufficient documentation

## 2019-01-09 DIAGNOSIS — I129 Hypertensive chronic kidney disease with stage 1 through stage 4 chronic kidney disease, or unspecified chronic kidney disease: Secondary | ICD-10-CM | POA: Diagnosis not present

## 2019-01-09 DIAGNOSIS — E875 Hyperkalemia: Secondary | ICD-10-CM | POA: Diagnosis not present

## 2019-01-09 DIAGNOSIS — D631 Anemia in chronic kidney disease: Secondary | ICD-10-CM | POA: Diagnosis not present

## 2019-01-09 DIAGNOSIS — E1122 Type 2 diabetes mellitus with diabetic chronic kidney disease: Secondary | ICD-10-CM | POA: Diagnosis not present

## 2019-01-09 DIAGNOSIS — N184 Chronic kidney disease, stage 4 (severe): Secondary | ICD-10-CM | POA: Diagnosis not present

## 2019-01-09 DIAGNOSIS — N179 Acute kidney failure, unspecified: Secondary | ICD-10-CM | POA: Diagnosis not present

## 2019-01-15 DIAGNOSIS — E1122 Type 2 diabetes mellitus with diabetic chronic kidney disease: Secondary | ICD-10-CM | POA: Diagnosis not present

## 2019-01-15 DIAGNOSIS — N184 Chronic kidney disease, stage 4 (severe): Secondary | ICD-10-CM | POA: Diagnosis not present

## 2019-01-15 DIAGNOSIS — Z794 Long term (current) use of insulin: Secondary | ICD-10-CM | POA: Diagnosis not present

## 2019-01-20 ENCOUNTER — Ambulatory Visit: Payer: Self-pay | Admitting: Adult Health

## 2019-01-21 DIAGNOSIS — E1142 Type 2 diabetes mellitus with diabetic polyneuropathy: Secondary | ICD-10-CM | POA: Diagnosis not present

## 2019-01-21 DIAGNOSIS — I340 Nonrheumatic mitral (valve) insufficiency: Secondary | ICD-10-CM | POA: Insufficient documentation

## 2019-01-21 DIAGNOSIS — N184 Chronic kidney disease, stage 4 (severe): Secondary | ICD-10-CM | POA: Diagnosis not present

## 2019-01-21 DIAGNOSIS — E1122 Type 2 diabetes mellitus with diabetic chronic kidney disease: Secondary | ICD-10-CM | POA: Diagnosis not present

## 2019-01-21 DIAGNOSIS — E1159 Type 2 diabetes mellitus with other circulatory complications: Secondary | ICD-10-CM | POA: Diagnosis not present

## 2019-01-21 DIAGNOSIS — R809 Proteinuria, unspecified: Secondary | ICD-10-CM | POA: Diagnosis not present

## 2019-01-21 DIAGNOSIS — E1129 Type 2 diabetes mellitus with other diabetic kidney complication: Secondary | ICD-10-CM | POA: Diagnosis not present

## 2019-01-21 DIAGNOSIS — M109 Gout, unspecified: Secondary | ICD-10-CM | POA: Insufficient documentation

## 2019-01-21 DIAGNOSIS — Z794 Long term (current) use of insulin: Secondary | ICD-10-CM | POA: Diagnosis not present

## 2019-01-25 DIAGNOSIS — E1142 Type 2 diabetes mellitus with diabetic polyneuropathy: Secondary | ICD-10-CM | POA: Insufficient documentation

## 2019-01-27 DIAGNOSIS — R002 Palpitations: Secondary | ICD-10-CM | POA: Diagnosis not present

## 2019-01-27 DIAGNOSIS — I2510 Atherosclerotic heart disease of native coronary artery without angina pectoris: Secondary | ICD-10-CM | POA: Diagnosis not present

## 2019-01-27 DIAGNOSIS — G459 Transient cerebral ischemic attack, unspecified: Secondary | ICD-10-CM | POA: Diagnosis not present

## 2019-01-27 DIAGNOSIS — I739 Peripheral vascular disease, unspecified: Secondary | ICD-10-CM | POA: Diagnosis not present

## 2019-01-27 DIAGNOSIS — I340 Nonrheumatic mitral (valve) insufficiency: Secondary | ICD-10-CM | POA: Diagnosis not present

## 2019-01-27 DIAGNOSIS — I10 Essential (primary) hypertension: Secondary | ICD-10-CM | POA: Diagnosis not present

## 2019-01-27 DIAGNOSIS — N184 Chronic kidney disease, stage 4 (severe): Secondary | ICD-10-CM | POA: Diagnosis not present

## 2019-01-27 DIAGNOSIS — I209 Angina pectoris, unspecified: Secondary | ICD-10-CM | POA: Diagnosis not present

## 2019-01-27 DIAGNOSIS — E782 Mixed hyperlipidemia: Secondary | ICD-10-CM | POA: Diagnosis not present

## 2019-02-05 ENCOUNTER — Encounter: Payer: Self-pay | Admitting: *Deleted

## 2019-02-05 ENCOUNTER — Encounter: Payer: Medicare Other | Attending: Cardiology | Admitting: *Deleted

## 2019-02-05 VITALS — Ht 66.5 in | Wt 198.8 lb

## 2019-02-05 DIAGNOSIS — I214 Non-ST elevation (NSTEMI) myocardial infarction: Secondary | ICD-10-CM | POA: Diagnosis not present

## 2019-02-05 NOTE — Progress Notes (Signed)
Cardiac Individual Treatment Plan  Patient Details  Name: Mike Decker MRN: [id number] Date of Birth: 1941/04/11 Referring Provider:     Cardiac Rehab from 02/05/2019 in [facility] Cardiac and Pulmonary Rehab  Referring Provider  [facility]      Initial Encounter Date:    Cardiac Rehab from 02/05/2019 in [facility] Cardiac and Pulmonary Rehab  Date  02/05/19      Visit Diagnosis: NSTEMI (non-ST elevated myocardial infarction) [facility])  Patient's Home Medications on Admission:  Current Outpatient Medications:  .  acetaminophen (TYLENOL) 500 MG tablet, Take 1,000 mg by mouth every 6 (six) hours as needed for mild pain, moderate pain or headache., Disp: , Rfl:  .  allopurinol (ZYLOPRIM) 100 MG tablet, Take 200 mg by mouth 2 (two) times daily. , Disp: , Rfl:  .  amLODipine (NORVASC) 5 MG tablet, Take by mouth., Disp: , Rfl:  .  budesonide-formoterol (SYMBICORT) 160-4.5 MCG/ACT inhaler, Inhale 2 puffs into the lungs 2 (two) times daily., Disp: , Rfl:  .  cetirizine (ZYRTEC) 5 MG tablet, Take 5 mg by mouth daily as needed for allergies or rhinitis. , Disp: , Rfl:  .  citalopram (CELEXA) 10 MG tablet, Take 10 mg by mouth daily., Disp: , Rfl:  .  clopidogrel (PLAVIX) 75 MG tablet, Take 1 tablet (75 mg total) by mouth daily., Disp: 30 tablet, Rfl: 0 .  colchicine 0.6 MG tablet, Take 1 tablet (0.6 mg total) by mouth daily. (Patient taking differently: Take 1.8 mg by mouth daily as needed (gout flares). ), Disp: 30 tablet, Rfl: 0 .  Cyanocobalamin (VITAMIN B-12) 1000 MCG SUBL, Take by mouth., Disp: , Rfl:  .  dextromethorphan-guaiFENesin (MUCINEX DM) 30-600 MG 12hr tablet, Take 1 tablet by mouth 2 (two) times daily as needed for cough., Disp: , Rfl:  .  EPINEPHrine (EPI-PEN) 0.3 mg/0.3 mL SOAJ injection, Inject into the muscle as needed (anaphylaxis). , Disp: , Rfl:  .  fluticasone (FLONASE) 50 MCG/ACT nasal spray, Place 2 sprays into both nostrils daily. , Disp: , Rfl: 3 .  folic acid (V-R FOLIC ACID) 088  MCG tablet, Take 1 tablet (400 mcg total) by mouth daily., Disp: 90 tablet, Rfl: 3 .  furosemide (LASIX) 20 MG tablet, Take 2 tablets (40 mg total) by mouth daily., Disp: , Rfl:  .  Icosapent Ethyl 1 g CAPS, Take by mouth., Disp: , Rfl:  .  insulin aspart (NOVOLOG FLEXPEN) 100 UNIT/ML FlexPen, Inject 20-32 Units into the skin See admin instructions. 20-32 units twice daily as directed, Disp: , Rfl:  .  insulin glargine (LANTUS) 100 UNIT/ML injection, Inject 62 Units into the skin at bedtime. , Disp: , Rfl:  .  isosorbide mononitrate (IMDUR) 60 MG 24 hr tablet, Take by mouth., Disp: , Rfl:  .  losartan (COZAAR) 25 MG tablet, Take by mouth., Disp: , Rfl:  .  magnesium gluconate (MAGONATE) 500 MG tablet, Take 500 mg by mouth 2 (two) times daily. , Disp: , Rfl:  .  methocarbamol (ROBAXIN) 500 MG tablet, Take 500 mg by mouth 2 (two) times daily. , Disp: , Rfl: 0 .  metoprolol tartrate (LOPRESSOR) 50 MG tablet, Take 75 mg by mouth 2 (two) times daily. , Disp: , Rfl:  .  nitroGLYCERIN (NITROSTAT) 0.4 MG SL tablet, Place 0.4 mg under the tongue every 5 (five) minutes x 3 doses as needed for chest pain. , Disp: , Rfl:  .  Omega-3 Fatty Acids (FISH OIL) 1000 MG CAPS, Take 1 capsule by  mouth daily. , Disp: , Rfl:  .  pantoprazole (PROTONIX) 40 MG tablet, Take 40 mg by mouth 2 (two) times daily., Disp: , Rfl:  .  rosuvastatin (CRESTOR) 40 MG tablet, Take 40 mg by mouth every evening. , Disp: , Rfl:  .  Vitamin D, Ergocalciferol, (DRISDOL) 50000 units CAPS capsule, Take 50,000 Units by mouth every 30 (thirty) days. , Disp: , Rfl: 1 .  aspirin (ASPIRIN CHILDRENS) 81 MG chewable tablet, Chew 1 tablet (81 mg total) by mouth daily., Disp: 120 tablet, Rfl: 0 .  ipratropium-albuterol (DUONEB) 0.5-2.5 (3) MG/3ML SOLN, Take 3 mLs by nebulization every 4 (four) hours as needed. (Patient not taking: Reported on 02/05/2019), Disp: 360 mL, Rfl: 0 .  ranolazine (RANEXA) 500 MG 12 hr tablet, Take 1 tablet (500 mg total) by  mouth 2 (two) times daily., Disp: 60 tablet, Rfl: 1 .  traMADol (ULTRAM) 50 MG tablet, Take 50 mg by mouth every 4 (four) hours as needed for moderate pain. , Disp: , Rfl:   Past Medical History: Past Medical History:  Diagnosis Date  . Anemia   . Arthritis   . Atrioventricular canal (AVC)    irregular heart beats  . Barrett esophagus   . Cancer [facility]) 2002   prostate  . Chronic diastolic CHF (congestive heart failure) ([place])   . Colon polyp   . Diabetes mellitus without complication ([place])   . Diverticulosis   . Gout   . Heart disease   . Hemangioma    liver  . Hyperlipidemia   . Hypertension   . Myocardial infarct ([place])   . Ocular hypertension   . Peripheral vascular disease ([place])   . Skin cancer   . Sleep apnea   . Vitreoretinal degeneration     Tobacco Use: Social History   Tobacco Use  Smoking Status Former Smoker  . Years: 20.00  . Types: Cigarettes  . Last attempt to quit: 12/10/1976  . Years since quitting: 42.1  Smokeless Tobacco Never Used    Labs: Recent Chemical engineer    Labs for ITP Cardiac and Pulmonary Rehab Latest Ref Rng & Units 10/08/2015 05/07/2017 11/15/2018   Cholestrol 0 - 200 mg/dL 189 92 112   LDLCALC 0 - 99 mg/dL UNABLE TO CALCULATE IF TRIGLYCERIDE OVER 400 mg/dL 35 51   HDL >40 mg/dL 30(L) 23(L) 27(L)   Trlycerides <150 mg/dL 610(H) 168(H) 171(H)   Hemoglobin A1c 4.8 - 5.6 % 9.4(H) 7.7(H) 7.2(H)   PHART 7.350 - 7.450 - 7.34(L) -   PCO2ART 32.0 - 48.0 mmHg - 26(L) -   HCO3 20.0 - 28.0 mmol/L - 14.0(L) -   ACIDBASEDEF 0.0 - 2.0 mmol/L - 10.1(H) -   O2SAT % - 88.9 -       Exercise Target Goals: Exercise Program Goal: Individual exercise prescription set using results from initial 6 min walk test and THRR while considering  patient's activity barriers and safety.   Exercise Prescription Goal: Initial exercise prescription builds to 30-45 minutes a day of aerobic activity, 2-3 days per week.  Home exercise guidelines will be given  to patient during program as part of exercise prescription that the participant will acknowledge.  Activity Barriers & Risk Stratification: Activity Barriers & Cardiac Risk Stratification - 02/05/19 1512      Activity Barriers & Cardiac Risk Stratification   Activity Barriers  Back Problems;Muscular Weakness;Shortness of Breath    Cardiac Risk Stratification  High       6 Minute Walk:  [place] Name 02/05/19 1344         6 Minute Walk   Phase  Initial     Distance  1296 feet     Walk Time  6 minutes     # of Rest Breaks  0     MPH  2.45     METS  2.5     RPE  17     Perceived Dyspnea   3     VO2 Peak  8.7     Symptoms  No     Resting HR  64 bpm     Resting BP  130/58     Resting Oxygen Saturation   99 %     Exercise Oxygen Saturation  during 6 min walk  99 %     Max Ex. HR  103 bpm     Max Ex. BP  164/46     2 Minute Post BP  138/40        Oxygen Initial Assessment:   Oxygen Re-Evaluation:   Oxygen Discharge (Final Oxygen Re-Evaluation):   Initial Exercise Prescription: Initial Exercise Prescription - 02/05/19 1300      Date of Initial Exercise RX and Referring Provider   Date  02/05/19    Referring Provider  [facility]      Treadmill   MPH  2    Grade  0    Minutes  15    METs  2.5      Recumbant Bike   Level  2    RPM  60    Watts  15    Minutes  15    METs  2.5      NuStep   Level  3    SPM  80    Minutes  15    METs  2.5      Prescription Details   Frequency (times per week)  3    Duration  Progress to 30 minutes of continuous aerobic without signs/symptoms of physical distress      Intensity   THRR 40-80% of Max Heartrate  95-126    Ratings of Perceived Exertion  11-15    Perceived Dyspnea  0-4      Resistance Training   Training Prescription  Yes    Weight  3 lb    Reps  10-15       Perform Capillary Blood Glucose checks as needed.  Exercise Prescription Changes: Exercise Prescription Changes    Row Name  02/05/19 1300             Response to Exercise   Blood Pressure (Admit)  130/58       Blood Pressure (Exercise)  164/46       Blood Pressure (Exit)  138/40       Heart Rate (Admit)  65 bpm       Heart Rate (Exercise)  103 bpm       Heart Rate (Exit)  77 bpm       Oxygen Saturation (Admit)  99 %       Oxygen Saturation (Exit)  99 %       Rating of Perceived Exertion (Exercise)  17       Perceived Dyspnea (Exercise)  3          Exercise Comments:   Exercise Goals and Review: Exercise Goals    Row Name 02/05/19 1343  Exercise Goals   Increase Physical Activity  Yes       Intervention  Provide advice, education, support and counseling about physical activity/exercise needs.;Develop an individualized exercise prescription for aerobic and resistive training based on initial evaluation findings, risk stratification, comorbidities and participant's personal goals.       Expected Outcomes  Short Term: Attend rehab on a regular basis to increase amount of physical activity.;Long Term: Add in home exercise to make exercise part of routine and to increase amount of physical activity.;Long Term: Exercising regularly at least 3-5 days a week.       Increase Strength and Stamina  Yes       Intervention  Provide advice, education, support and counseling about physical activity/exercise needs.;Develop an individualized exercise prescription for aerobic and resistive training based on initial evaluation findings, risk stratification, comorbidities and participant's personal goals.       Expected Outcomes  Short Term: Increase workloads from initial exercise prescription for resistance, speed, and METs.;Short Term: Perform resistance training exercises routinely during rehab and add in resistance training at home;Long Term: Improve cardiorespiratory fitness, muscular endurance and strength as measured by increased METs and functional capacity (6MWT)       Able to understand and use rate  of perceived exertion (RPE) scale  Yes       Intervention  Provide education and explanation on how to use RPE scale       Expected Outcomes  Short Term: Able to use RPE daily in rehab to express subjective intensity level;Long Term:  Able to use RPE to guide intensity level when exercising independently       Knowledge and understanding of Target Heart Rate Range (THRR)  Yes       Intervention  Provide education and explanation of THRR including how the numbers were predicted and where they are located for reference       Expected Outcomes  Short Term: Able to state/look up THRR;Short Term: Able to use daily as guideline for intensity in rehab;Long Term: Able to use THRR to govern intensity when exercising independently       Able to check pulse independently  Yes       Intervention  Provide education and demonstration on how to check pulse in carotid and radial arteries.;Review the importance of being able to check your own pulse for safety during independent exercise       Expected Outcomes  Short Term: Able to explain why pulse checking is important during independent exercise;Long Term: Able to check pulse independently and accurately       Understanding of Exercise Prescription  Yes       Intervention  Provide education, explanation, and written materials on patient's individual exercise prescription       Expected Outcomes  Short Term: Able to explain program exercise prescription;Long Term: Able to explain home exercise prescription to exercise independently          Exercise Goals Re-Evaluation :   Discharge Exercise Prescription (Final Exercise Prescription Changes): Exercise Prescription Changes - 02/05/19 1300      Response to Exercise   Blood Pressure (Admit)  130/58    Blood Pressure (Exercise)  164/46    Blood Pressure (Exit)  138/40    Heart Rate (Admit)  65 bpm    Heart Rate (Exercise)  103 bpm    Heart Rate (Exit)  77 bpm    Oxygen Saturation (Admit)  99 %    Oxygen  Saturation (Exit)  99 %    Rating of Perceived Exertion (Exercise)  17    Perceived Dyspnea (Exercise)  3       Nutrition:  Target Goals: Understanding of nutrition guidelines, daily intake of sodium <1561m, cholesterol <2045m calories 30% from fat and 7% or less from saturated fats, daily to have 5 or more servings of fruits and vegetables.  Biometrics: Pre Biometrics - 02/05/19 1333      Pre Biometrics   Height  5' 6.5" (1.689 m)    Weight  198 lb 12.8 oz (90.2 kg)    Waist Circumference  44 inches    Hip Circumference  39 inches    Waist to Hip Ratio  1.13 %    BMI (Calculated)  31.61    Single Leg Stand  2.45 seconds        Nutrition Therapy Plan and Nutrition Goals: Nutrition Therapy & Goals - 02/05/19 1511      Intervention Plan   Intervention  Prescribe, educate and counsel regarding individualized specific dietary modifications aiming towards targeted core components such as weight, hypertension, lipid management, diabetes, heart failure and other comorbidities.;Nutrition handout(s) given to patient.    Expected Outcomes  Short Term Goal: Understand basic principles of dietary content, such as calories, fat, sodium, cholesterol and nutrients.;Short Term Goal: A plan has been developed with personal nutrition goals set during dietitian appointment.;Long Term Goal: Adherence to prescribed nutrition plan.       Nutrition Assessments: Nutrition Assessments - 02/05/19 1511      MEDFICTS Scores   Pre Score  18       Nutrition Goals Re-Evaluation:   Nutrition Goals Discharge (Final Nutrition Goals Re-Evaluation):   Psychosocial: Target Goals: Acknowledge presence or absence of significant depression and/or stress, maximize coping skills, provide positive support system. Participant is able to verbalize types and ability to use techniques and skills needed for reducing stress and depression.   Initial Review & Psychosocial Screening: Initial Psych Review &  Screening - 02/05/19 1505      Initial Review   Current issues with  Current Stress Concerns;Current Sleep Concerns   It is very hard for [name] to fall asleep, has been that way for years he said. He sometimes doesn't fall asleep until like 5 am. He paces at night trying to wear himself out   Source of Stress Concerns  Chronic Illness;Family;Unable to perform yard/household activities    Comments  ScNicki Reaperas twin [id number] old boys that have Down Syndrome. He is feeling guilty that his wife is having to care for him and their boys. He has not driven since his stroke a while back, but is going to take [employer] to attend classes. He really wants to get back to doing more around the house and being a better helper.       Family Dynamics   Good Support System?  Yes   wife     Barriers   Psychosocial barriers to participate in program  There are no identifiable barriers or psychosocial needs.;The patient should benefit from training in stress management and relaxation.      Screening Interventions   Interventions  Encouraged to exercise;Program counselor consult;Provide feedback about the scores to participant;To provide support and resources with identified psychosocial needs    Expected Outcomes  Short Term goal: Utilizing psychosocial counselor, staff and physician to assist with identification of specific Stressors or current issues interfering with healing process. Setting desired goal for each stressor or current issue identified.;Long Term  Goal: Stressors or current issues are controlled or eliminated.;Short Term goal: Identification and review with participant of any Quality of Life or Depression concerns found by scoring the questionnaire.;Long Term goal: The participant improves quality of Life and PHQ9 Scores as seen by post scores and/or verbalization of changes       Quality of Life Scores:  Quality of Life - 02/05/19 1507      Quality of Life   Select  Quality of Life      Quality of  Life Scores   Health/Function Pre  14.07 %    Socioeconomic Pre  30 %    Psych/Spiritual Pre  14.36 %    Family Pre  24 %    GLOBAL Pre  18.53 %      Scores of 19 and below usually indicate a poorer quality of life in these areas.  A difference of  2-3 points is a clinically meaningful difference.  A difference of 2-3 points in the total score of the Quality of Life Index has been associated with significant improvement in overall quality of life, self-image, physical symptoms, and general health in studies assessing change in quality of life.  PHQ-9: Recent Review Flowsheet Data    Depression screen [facility] 2/9 02/05/2019 09/04/2018 02/27/2018   Decreased Interest 1 0 0   Down, Depressed, Hopeless 1 0 0   PHQ - 2 Score 2 0 0   Altered sleeping 3 - -   Tired, decreased energy 0 - -   Feeling bad or failure about yourself  1 - -   Trouble concentrating 0 - -   Moving slowly or fidgety/restless 0 - -   Suicidal thoughts 0 - -   PHQ-9 Score 6 - -   Difficult doing work/chores Somewhat difficult - -     Interpretation of Total Score  Total Score Depression Severity:  1-4 = Minimal depression, 5-9 = Mild depression, 10-14 = Moderate depression, 15-19 = Moderately severe depression, 20-27 = Severe depression   Psychosocial Evaluation and Intervention:   Psychosocial Re-Evaluation:   Psychosocial Discharge (Final Psychosocial Re-Evaluation):   Vocational Rehabilitation: Provide vocational rehab assistance to qualifying candidates.   Vocational Rehab Evaluation & Intervention: Vocational Rehab - 02/05/19 1504      Initial Vocational Rehab Evaluation & Intervention   Assessment shows need for Vocational Rehabilitation  No       Education: Education Goals: Education classes will be provided on a variety of topics geared toward better understanding of heart health and risk factor modification. Participant will state understanding/return demonstration of topics presented as noted by  education test scores.  Learning Barriers/Preferences: Learning Barriers/Preferences - 02/05/19 1503      Learning Barriers/Preferences   Learning Barriers  Sight;Hearing    Learning Preferences  Skilled Demonstration;Video;Verbal Instruction       Education Topics:  AED/CPR: - Group verbal and written instruction with the use of models to demonstrate the basic use of the AED with the basic ABC's of resuscitation.   General Nutrition Guidelines/Fats and Fiber: -Group instruction provided by verbal, written material, models and posters to present the general guidelines for heart healthy nutrition. Gives an explanation and review of dietary fats and fiber.   Controlling Sodium/Reading Food Labels: -Group verbal and written material supporting the discussion of sodium use in heart healthy nutrition. Review and explanation with models, verbal and written materials for utilization of the food label.   Exercise Physiology & General Exercise Guidelines: - Group verbal and written  instruction with models to review the exercise physiology of the cardiovascular system and associated critical values. Provides general exercise guidelines with specific guidelines to those with heart or lung disease.    Aerobic Exercise & Resistance Training: - Gives group verbal and written instruction on the various components of exercise. Focuses on aerobic and resistive training programs and the benefits of this training and how to safely progress through these programs..   Flexibility, Balance, Mind/Body Relaxation: Provides group verbal/written instruction on the benefits of flexibility and balance training, including mind/body exercise modes such as yoga, pilates and tai chi.  Demonstration and skill practice provided.   Stress and Anxiety: - Provides group verbal and written instruction about the health risks of elevated stress and causes of high stress.  Discuss the correlation between heart/lung  disease and anxiety and treatment options. Review healthy ways to manage with stress and anxiety.   Depression: - Provides group verbal and written instruction on the correlation between heart/lung disease and depressed mood, treatment options, and the stigmas associated with seeking treatment.   Anatomy & Physiology of the Heart: - Group verbal and written instruction and models provide basic cardiac anatomy and physiology, with the coronary electrical and arterial systems. Review of Valvular disease and Heart Failure   Cardiac Procedures: - Group verbal and written instruction to review commonly prescribed medications for heart disease. Reviews the medication, class of the drug, and side effects. Includes the steps to properly store meds and maintain the prescription regimen. (beta blockers and nitrates)   Cardiac Medications I: - Group verbal and written instruction to review commonly prescribed medications for heart disease. Reviews the medication, class of the drug, and side effects. Includes the steps to properly store meds and maintain the prescription regimen.   Cardiac Medications II: -Group verbal and written instruction to review commonly prescribed medications for heart disease. Reviews the medication, class of the drug, and side effects. (all other drug classes)    Go Sex-Intimacy & Heart Disease, Get SMART - Goal Setting: - Group verbal and written instruction through game format to discuss heart disease and the return to sexual intimacy. Provides group verbal and written material to discuss and apply goal setting through the application of the S.M.A.R.T. Method.   Other Matters of the Heart: - Provides group verbal, written materials and models to describe Stable Angina and Peripheral Artery. Includes description of the disease process and treatment options available to the cardiac patient.   Exercise & Equipment Safety: - Individual verbal instruction and  demonstration of equipment use and safety with use of the equipment.   Cardiac Rehab from 02/05/2019 in [facility] Cardiac and Pulmonary Rehab  Date  02/05/19  Educator  [facility]  Instruction Review Code  1- Verbalizes Understanding      Infection Prevention: - Provides verbal and written material to individual with discussion of infection control including proper hand washing and proper equipment cleaning during exercise session.   Cardiac Rehab from 02/05/2019 in [facility] Cardiac and Pulmonary Rehab  Date  02/05/19  Educator  [facility]  Instruction Review Code  1- Verbalizes Understanding      Falls Prevention: - Provides verbal and written material to individual with discussion of falls prevention and safety.   Cardiac Rehab from 02/05/2019 in [facility] Cardiac and Pulmonary Rehab  Date  02/05/19  Educator  [facility]  Instruction Review Code  1- Verbalizes Understanding      Diabetes: - Individual verbal and written instruction to review signs/symptoms of diabetes, desired ranges  of glucose level fasting, after meals and with exercise. Acknowledge that pre and post exercise glucose checks will be done for 3 sessions at entry of program.   Cardiac Rehab from 02/05/2019 in [facility] Cardiac and Pulmonary Rehab  Date  02/05/19  Educator  [facility]  Instruction Review Code  1- Verbalizes Understanding      Know Your Numbers and Risk Factors: -Group verbal and written instruction about important numbers in your health.  Discussion of what are risk factors and how they play a role in the disease process.  Review of Cholesterol, Blood Pressure, Diabetes, and BMI and the role they play in your overall health.   Sleep Hygiene: -Provides group verbal and written instruction about how sleep can affect your health.  Define sleep hygiene, discuss sleep cycles and impact of sleep habits. Review good sleep hygiene tips.    Other: -Provides group and verbal instruction on various topics (see comments)   Knowledge Questionnaire  Score: Knowledge Questionnaire Score - 02/05/19 1503      Knowledge Questionnaire Score   Pre Score  22/26   correct answers reviewed with [name]. Focus on exerice and nutrition      Core Components/Risk Factors/Patient Goals at Admission: Personal Goals and Risk Factors at Admission - 02/05/19 1502      Core Components/Risk Factors/Patient Goals on Admission    Weight Management  Yes;Weight Loss    Intervention  Weight Management: Develop a combined nutrition and exercise program designed to reach desired caloric intake, while maintaining appropriate intake of nutrient and fiber, sodium and fats, and appropriate energy expenditure required for the weight goal.;Weight Management: Provide education and appropriate resources to help participant work on and attain dietary goals.;Weight Management/Obesity: Establish reasonable short term and long term weight goals.    Admit Weight  198 lb 12.8 oz (90.2 kg)    Goal Weight: Short Term  194 lb (88 kg)    Goal Weight: Long Term  170 lb (77.1 kg)    Expected Outcomes  Short Term: Continue to assess and modify interventions until short term weight is achieved;Long Term: Adherence to nutrition and physical activity/exercise program aimed toward attainment of established weight goal;Weight Loss: Understanding of general recommendations for a balanced deficit meal plan, which promotes 1-2 lb weight loss per week and includes a negative energy balance of [phone] kcal/d;Understanding recommendations for meals to include 15-35% energy as protein, 25-35% energy from fat, 35-60% energy from carbohydrates, less than 248m of dietary cholesterol, 20-35 gm of total fiber daily;Understanding of distribution of calorie intake throughout the day with the consumption of 4-5 meals/snacks    Diabetes  Yes    Intervention  Provide education about signs/symptoms and action to take for hypo/hyperglycemia.;Provide education about proper nutrition, including hydration, and  aerobic/resistive exercise prescription along with prescribed medications to achieve blood glucose in normal ranges: Fasting glucose 65-99 mg/dL    Expected Outcomes  Long Term: Attainment of HbA1C < 7%.;Short Term: Participant verbalizes understanding of the signs/symptoms and immediate care of hyper/hypoglycemia, proper foot care and importance of medication, aerobic/resistive exercise and nutrition plan for blood glucose control.    Heart Failure  Yes    Intervention  Provide a combined exercise and nutrition program that is supplemented with education, support and counseling about heart failure. Directed toward relieving symptoms such as shortness of breath, decreased exercise tolerance, and extremity edema.    Expected Outcomes  Improve functional capacity of life;Short term: Attendance in program 2-3 days a week with increased exercise  capacity. Reported lower sodium intake. Reported increased fruit and vegetable intake. Reports medication compliance.;Short term: Daily weights obtained and reported for increase. Utilizing diuretic protocols set by physician.;Long term: Adoption of self-care skills and reduction of barriers for early signs and symptoms recognition and intervention leading to self-care maintenance.    Hypertension  Yes    Intervention  Provide education on lifestyle modifcations including regular physical activity/exercise, weight management, moderate sodium restriction and increased consumption of fresh fruit, vegetables, and low fat dairy, alcohol moderation, and smoking cessation.;Monitor prescription use compliance.    Expected Outcomes  Short Term: Continued assessment and intervention until BP is < 140/33m HG in hypertensive participants. < 130/856mHG in hypertensive participants with diabetes, heart failure or chronic kidney disease.;Long Term: Maintenance of blood pressure at goal levels.    Lipids  Yes    Intervention  Provide education and support for participant on  nutrition & aerobic/resistive exercise along with prescribed medications to achieve LDL <7043mHDL >68m21m  Expected Outcomes  Short Term: Participant states understanding of desired cholesterol values and is compliant with medications prescribed. Participant is following exercise prescription and nutrition guidelines.;Long Term: Cholesterol controlled with medications as prescribed, with individualized exercise RX and with personalized nutrition plan. Value goals: LDL < 70mg43mL > 40 mg.       Core Components/Risk Factors/Patient Goals Review:    Core Components/Risk Factors/Patient Goals at Discharge (Final Review):    ITP Comments: ITP Comments    Row Name 02/05/19 1440           ITP Comments  Med Review completed. Initial ITP created. Diagnosis can be found in [facility]          Comments: Initial ITP

## 2019-02-05 NOTE — Progress Notes (Signed)
Daily Session Note  Patient Details  Name: HUSSAM MUNIZ MRN: [id number] Date of Birth: 08/08/1941 Referring Provider:     Cardiac Rehab from 02/05/2019 in [facility]  Referring Provider  [facility]      Encounter Date: 02/05/2019  Check In: Session Check In - 02/05/19 Jerome      Check-In   Supervising physician immediately available to respond to emergencies  See telemetry face sheet for immediately available ER MD    Location  [facility]-Cardiac & Pulmonary Rehab    Staff Present  Renita Papa, RN Vickki Hearing, BA, ACSM CEP, Exercise Physiologist    Medication changes reported      No    Fall or balance concerns reported     No    Warm-up and Cool-down  Not performed (comment)   Med review   Resistance Training Performed  Yes    VAD Patient?  No    PAD/SET Patient?  No      Pain Assessment   Currently in Pain?  No/denies        Exercise Prescription Changes - 02/05/19 1300      Response to Exercise   Blood Pressure (Admit)  130/58    Blood Pressure (Exercise)  164/46    Blood Pressure (Exit)  138/40    Heart Rate (Admit)  65 bpm    Heart Rate (Exercise)  103 bpm    Heart Rate (Exit)  77 bpm    Oxygen Saturation (Admit)  99 %    Oxygen Saturation (Exit)  99 %    Rating of Perceived Exertion (Exercise)  17    Perceived Dyspnea (Exercise)  3       Social History   Tobacco Use  Smoking Status Former Smoker  . Years: 20.00  . Types: Cigarettes  . Last attempt to quit: 12/10/1976  . Years since quitting: 42.1  Smokeless Tobacco Never Used    Goals Met:  Proper associated with RPD/PD & O2 Sat Exercise tolerated well Personal goals reviewed No report of cardiac concerns or symptoms Strength training completed today  Goals Unmet:  Not Applicable  Comments: Med Review completed   Dr. Emily Filbert is Medical Director for [place] and [facility].

## 2019-02-05 NOTE — Patient Instructions (Signed)
Patient Instructions  Patient Details  Name: Mike Decker MRN: [id number] Date of Birth: 05/17/1941 Referring Provider:  Isaias Cowman, MD  Below are your personal goals for exercise, nutrition, and risk factors. Our goal is to help you stay on track towards obtaining and maintaining these goals. We will be discussing your progress on these goals with you throughout the program.  Initial Exercise Prescription: Initial Exercise Prescription - 02/05/19 1300      Date of Initial Exercise RX and Referring Provider   Date  02/05/19    Referring Provider  [facility]      Treadmill   MPH  2    Grade  0    Minutes  15    METs  2.5      Recumbant Bike   Level  2    RPM  60    Watts  15    Minutes  15    METs  2.5      NuStep   Level  3    SPM  80    Minutes  15    METs  2.5      Prescription Details   Frequency (times per week)  3    Duration  Progress to 30 minutes of continuous aerobic without signs/symptoms of physical distress      Intensity   THRR 40-80% of Max Heartrate  95-126    Ratings of Perceived Exertion  11-15    Perceived Dyspnea  0-4      Resistance Training   Training Prescription  Yes    Weight  3 lb    Reps  10-15       Exercise Goals: Frequency: Be able to perform aerobic exercise two to three times per week in program working toward 2-5 days per week of home exercise.  Intensity: Work with a perceived exertion of 11 (fairly light) - 15 (hard) while following your exercise prescription.  We will make changes to your prescription with you as you progress through the program.   Duration: Be able to do 30 to 45 minutes of continuous aerobic exercise in addition to a 5 minute warm-up and a 5 minute cool-down routine.   Nutrition Goals: Your personal nutrition goals will be established when you do your nutrition analysis with the dietician.  The following are general nutrition guidelines to follow: Cholesterol < 200mg /day Sodium <  1500mg /day Fiber: Men over 50 yrs - 30 grams per day  Personal Goals: Personal Goals and Risk Factors at Admission - 02/05/19 1502      Core Components/Risk Factors/Patient Goals on Admission    Weight Management  Yes;Weight Loss    Intervention  Weight Management: Develop a combined nutrition and exercise program designed to reach desired caloric intake, while maintaining appropriate intake of nutrient and fiber, sodium and fats, and appropriate energy expenditure required for the weight goal.;Weight Management: Provide education and appropriate resources to help participant work on and attain dietary goals.;Weight Management/Obesity: Establish reasonable short term and long term weight goals.    Admit Weight  198 lb 12.8 oz (90.2 kg)    Goal Weight: Short Term  194 lb (88 kg)    Goal Weight: Long Term  170 lb (77.1 kg)    Expected Outcomes  Short Term: Continue to assess and modify interventions until short term weight is achieved;Long Term: Adherence to nutrition and physical activity/exercise program aimed toward attainment of established weight goal;Weight Loss: Understanding of general recommendations for a balanced deficit meal  plan, which promotes 1-2 lb weight loss per week and includes a negative energy balance of [phone] kcal/d;Understanding recommendations for meals to include 15-35% energy as protein, 25-35% energy from fat, 35-60% energy from carbohydrates, less than 200mg  of dietary cholesterol, 20-35 gm of total fiber daily;Understanding of distribution of calorie intake throughout the day with the consumption of 4-5 meals/snacks    Diabetes  Yes    Intervention  Provide education about signs/symptoms and action to take for hypo/hyperglycemia.;Provide education about proper nutrition, including hydration, and aerobic/resistive exercise prescription along with prescribed medications to achieve blood glucose in normal ranges: Fasting glucose 65-99 mg/dL    Expected Outcomes  Long Term:  Attainment of HbA1C < 7%.;Short Term: Participant verbalizes understanding of the signs/symptoms and immediate care of hyper/hypoglycemia, proper foot care and importance of medication, aerobic/resistive exercise and nutrition plan for blood glucose control.    Heart Failure  Yes    Intervention  Provide a combined exercise and nutrition program that is supplemented with education, support and counseling about heart failure. Directed toward relieving symptoms such as shortness of breath, decreased exercise tolerance, and extremity edema.    Expected Outcomes  Improve functional capacity of life;Short term: Attendance in program 2-3 days a week with increased exercise capacity. Reported lower sodium intake. Reported increased fruit and vegetable intake. Reports medication compliance.;Short term: Daily weights obtained and reported for increase. Utilizing diuretic protocols set by physician.;Long term: Adoption of self-care skills and reduction of barriers for early signs and symptoms recognition and intervention leading to self-care maintenance.    Hypertension  Yes    Intervention  Provide education on lifestyle modifcations including regular physical activity/exercise, weight management, moderate sodium restriction and increased consumption of fresh fruit, vegetables, and low fat dairy, alcohol moderation, and smoking cessation.;Monitor prescription use compliance.    Expected Outcomes  Short Term: Continued assessment and intervention until BP is < 140/85mm HG in hypertensive participants. < 130/28mm HG in hypertensive participants with diabetes, heart failure or chronic kidney disease.;Long Term: Maintenance of blood pressure at goal levels.    Lipids  Yes    Intervention  Provide education and support for participant on nutrition & aerobic/resistive exercise along with prescribed medications to achieve LDL 70mg , HDL >40mg .    Expected Outcomes  Short Term: Participant states understanding of desired  cholesterol values and is compliant with medications prescribed. Participant is following exercise prescription and nutrition guidelines.;Long Term: Cholesterol controlled with medications as prescribed, with individualized exercise RX and with personalized nutrition plan. Value goals: LDL < 70mg , HDL > 40 mg.       Tobacco Use Initial Evaluation: Social History   Tobacco Use  Smoking Status Former Smoker  . Years: 20.00  . Types: Cigarettes  . Last attempt to quit: 12/10/1976  . Years since quitting: 42.1  Smokeless Tobacco Never Used    Exercise Goals and Review: Exercise Goals    Row Name 02/05/19 1343             Exercise Goals   Increase Physical Activity  Yes       Intervention  Provide advice, education, support and counseling about physical activity/exercise needs.;Develop an individualized exercise prescription for aerobic and resistive training based on initial evaluation findings, risk stratification, comorbidities and participant's personal goals.       Expected Outcomes  Short Term: Attend rehab on a regular basis to increase amount of physical activity.;Long Term: Add in home exercise to make exercise part of routine and to increase amount  of physical activity.;Long Term: Exercising regularly at least 3-5 days a week.       Increase Strength and Stamina  Yes       Intervention  Provide advice, education, support and counseling about physical activity/exercise needs.;Develop an individualized exercise prescription for aerobic and resistive training based on initial evaluation findings, risk stratification, comorbidities and participant's personal goals.       Expected Outcomes  Short Term: Increase workloads from initial exercise prescription for resistance, speed, and METs.;Short Term: Perform resistance training exercises routinely during rehab and add in resistance training at home;Long Term: Improve cardiorespiratory fitness, muscular endurance and strength as measured by  increased METs and functional capacity (6MWT)       Able to understand and use rate of perceived exertion (RPE) scale  Yes       Intervention  Provide education and explanation on how to use RPE scale       Expected Outcomes  Short Term: Able to use RPE daily in rehab to express subjective intensity level;Long Term:  Able to use RPE to guide intensity level when exercising independently       Knowledge and understanding of Target Heart Rate Range (THRR)  Yes       Intervention  Provide education and explanation of THRR including how the numbers were predicted and where they are located for reference       Expected Outcomes  Short Term: Able to state/look up THRR;Short Term: Able to use daily as guideline for intensity in rehab;Long Term: Able to use THRR to govern intensity when exercising independently       Able to check pulse independently  Yes       Intervention  Provide education and demonstration on how to check pulse in carotid and radial arteries.;Review the importance of being able to check your own pulse for safety during independent exercise       Expected Outcomes  Short Term: Able to explain why pulse checking is important during independent exercise;Long Term: Able to check pulse independently and accurately       Understanding of Exercise Prescription  Yes       Intervention  Provide education, explanation, and written materials on patient's individual exercise prescription       Expected Outcomes  Short Term: Able to explain program exercise prescription;Long Term: Able to explain home exercise prescription to exercise independently          Copy of goals given to participant.

## 2019-02-11 DIAGNOSIS — R002 Palpitations: Secondary | ICD-10-CM | POA: Diagnosis not present

## 2019-02-16 ENCOUNTER — Telehealth: Payer: Self-pay

## 2019-02-16 NOTE — Telephone Encounter (Signed)
Mr Perfect would like to be dropped from [facility] due to sciatica and he is concerned about coronavirus with all his health conditions.

## 2019-02-17 ENCOUNTER — Ambulatory Visit: Payer: Medicare Other

## 2019-02-17 ENCOUNTER — Encounter: Payer: Self-pay | Admitting: *Deleted

## 2019-02-17 DIAGNOSIS — I214 Non-ST elevation (NSTEMI) myocardial infarction: Secondary | ICD-10-CM

## 2019-02-17 NOTE — Progress Notes (Signed)
Cardiac Individual Treatment Plan  Patient Details  Name: Mike Decker MRN: [id number] Date of Birth: Apr 15, 1941 Referring Provider:     Cardiac Rehab from 02/05/2019 in [facility] Cardiac and Pulmonary Rehab  Referring Provider  [facility]      Initial Encounter Date:    Cardiac Rehab from 02/05/2019 in [facility] Cardiac and Pulmonary Rehab  Date  02/05/19      Visit Diagnosis: NSTEMI (non-ST elevated myocardial infarction) [facility])  Patient's Home Medications on Admission:  Current Outpatient Medications:  .  acetaminophen (TYLENOL) 500 MG tablet, Take 1,000 mg by mouth every 6 (six) hours as needed for mild pain, moderate pain or headache., Disp: , Rfl:  .  allopurinol (ZYLOPRIM) 100 MG tablet, Take 200 mg by mouth 2 (two) times daily. , Disp: , Rfl:  .  amLODipine (NORVASC) 5 MG tablet, Take by mouth., Disp: , Rfl:  .  aspirin (ASPIRIN CHILDRENS) 81 MG chewable tablet, Chew 1 tablet (81 mg total) by mouth daily., Disp: 120 tablet, Rfl: 0 .  budesonide-formoterol (SYMBICORT) 160-4.5 MCG/ACT inhaler, Inhale 2 puffs into the lungs 2 (two) times daily., Disp: , Rfl:  .  cetirizine (ZYRTEC) 5 MG tablet, Take 5 mg by mouth daily as needed for allergies or rhinitis. , Disp: , Rfl:  .  citalopram (CELEXA) 10 MG tablet, Take 10 mg by mouth daily., Disp: , Rfl:  .  clopidogrel (PLAVIX) 75 MG tablet, Take 1 tablet (75 mg total) by mouth daily., Disp: 30 tablet, Rfl: 0 .  colchicine 0.6 MG tablet, Take 1 tablet (0.6 mg total) by mouth daily. (Patient taking differently: Take 1.8 mg by mouth daily as needed (gout flares). ), Disp: 30 tablet, Rfl: 0 .  Cyanocobalamin (VITAMIN B-12) 1000 MCG SUBL, Take by mouth., Disp: , Rfl:  .  dextromethorphan-guaiFENesin (MUCINEX DM) 30-600 MG 12hr tablet, Take 1 tablet by mouth 2 (two) times daily as needed for cough., Disp: , Rfl:  .  EPINEPHrine (EPI-PEN) 0.3 mg/0.3 mL SOAJ injection, Inject into the muscle as needed (anaphylaxis). , Disp: , Rfl:  .  fluticasone  (FLONASE) 50 MCG/ACT nasal spray, Place 2 sprays into both nostrils daily. , Disp: , Rfl: 3 .  folic acid (V-R FOLIC ACID) 732 MCG tablet, Take 1 tablet (400 mcg total) by mouth daily., Disp: 90 tablet, Rfl: 3 .  furosemide (LASIX) 20 MG tablet, Take 2 tablets (40 mg total) by mouth daily., Disp: , Rfl:  .  Icosapent Ethyl 1 g CAPS, Take by mouth., Disp: , Rfl:  .  insulin aspart (NOVOLOG FLEXPEN) 100 UNIT/ML FlexPen, Inject 20-32 Units into the skin See admin instructions. 20-32 units twice daily as directed, Disp: , Rfl:  .  insulin glargine (LANTUS) 100 UNIT/ML injection, Inject 62 Units into the skin at bedtime. , Disp: , Rfl:  .  ipratropium-albuterol (DUONEB) 0.5-2.5 (3) MG/3ML SOLN, Take 3 mLs by nebulization every 4 (four) hours as needed. (Patient not taking: Reported on 02/05/2019), Disp: 360 mL, Rfl: 0 .  isosorbide mononitrate (IMDUR) 60 MG 24 hr tablet, Take by mouth., Disp: , Rfl:  .  losartan (COZAAR) 25 MG tablet, Take by mouth., Disp: , Rfl:  .  magnesium gluconate (MAGONATE) 500 MG tablet, Take 500 mg by mouth 2 (two) times daily. , Disp: , Rfl:  .  methocarbamol (ROBAXIN) 500 MG tablet, Take 500 mg by mouth 2 (two) times daily. , Disp: , Rfl: 0 .  metoprolol tartrate (LOPRESSOR) 50 MG tablet, Take 75 mg by mouth 2 (  two) times daily. , Disp: , Rfl:  .  nitroGLYCERIN (NITROSTAT) 0.4 MG SL tablet, Place 0.4 mg under the tongue every 5 (five) minutes x 3 doses as needed for chest pain. , Disp: , Rfl:  .  Omega-3 Fatty Acids (FISH OIL) 1000 MG CAPS, Take 1 capsule by mouth daily. , Disp: , Rfl:  .  pantoprazole (PROTONIX) 40 MG tablet, Take 40 mg by mouth 2 (two) times daily., Disp: , Rfl:  .  ranolazine (RANEXA) 500 MG 12 hr tablet, Take 1 tablet (500 mg total) by mouth 2 (two) times daily., Disp: 60 tablet, Rfl: 1 .  rosuvastatin (CRESTOR) 40 MG tablet, Take 40 mg by mouth every evening. , Disp: , Rfl:  .  traMADol (ULTRAM) 50 MG tablet, Take 50 mg by mouth every 4 (four) hours as  needed for moderate pain. , Disp: , Rfl:  .  Vitamin D, Ergocalciferol, (DRISDOL) 50000 units CAPS capsule, Take 50,000 Units by mouth every 30 (thirty) days. , Disp: , Rfl: 1  Past Medical History: Past Medical History:  Diagnosis Date  . Anemia   . Arthritis   . Atrioventricular canal (AVC)    irregular heart beats  . Barrett esophagus   . Cancer [facility]) 2002   prostate  . Chronic diastolic CHF (congestive heart failure) ([place])   . Colon polyp   . Diabetes mellitus without complication ([place])   . Diverticulosis   . Gout   . Heart disease   . Hemangioma    liver  . Hyperlipidemia   . Hypertension   . Myocardial infarct ([place])   . Ocular hypertension   . Peripheral vascular disease ([place])   . Skin cancer   . Sleep apnea   . Vitreoretinal degeneration     Tobacco Use: Social History   Tobacco Use  Smoking Status Former Smoker  . Years: 20.00  . Types: Cigarettes  . Last attempt to quit: 12/10/1976  . Years since quitting: 42.2  Smokeless Tobacco Never Used    Labs: Recent Chemical engineer    Labs for ITP Cardiac and Pulmonary Rehab Latest Ref Rng & Units 10/08/2015 05/07/2017 11/15/2018   Cholestrol 0 - 200 mg/dL 189 92 112   LDLCALC 0 - 99 mg/dL UNABLE TO CALCULATE IF TRIGLYCERIDE OVER 400 mg/dL 35 51   HDL >40 mg/dL 30(L) 23(L) 27(L)   Trlycerides <150 mg/dL 610(H) 168(H) 171(H)   Hemoglobin A1c 4.8 - 5.6 % 9.4(H) 7.7(H) 7.2(H)   PHART 7.350 - 7.450 - 7.34(L) -   PCO2ART 32.0 - 48.0 mmHg - 26(L) -   HCO3 20.0 - 28.0 mmol/L - 14.0(L) -   ACIDBASEDEF 0.0 - 2.0 mmol/L - 10.1(H) -   O2SAT % - 88.9 -       Exercise Target Goals: Exercise Program Goal: Individual exercise prescription set using results from initial 6 min walk test and THRR while considering  patient's activity barriers and safety.   Exercise Prescription Goal: Initial exercise prescription builds to 30-45 minutes a day of aerobic activity, 2-3 days per week.  Home exercise guidelines will be  given to patient during program as part of exercise prescription that the participant will acknowledge.  Activity Barriers & Risk Stratification: Activity Barriers & Cardiac Risk Stratification - 02/05/19 1512      Activity Barriers & Cardiac Risk Stratification   Activity Barriers  Back Problems;Muscular Weakness;Shortness of Breath    Cardiac Risk Stratification  High       6 Minute Walk:  [place] Name 02/05/19 1344         6 Minute Walk   Phase  Initial     Distance  1296 feet     Walk Time  6 minutes     # of Rest Breaks  0     MPH  2.45     METS  2.5     RPE  17     Perceived Dyspnea   3     VO2 Peak  8.7     Symptoms  No     Resting HR  64 bpm     Resting BP  130/58     Resting Oxygen Saturation   99 %     Exercise Oxygen Saturation  during 6 min walk  99 %     Max Ex. HR  103 bpm     Max Ex. BP  164/46     2 Minute Post BP  138/40        Oxygen Initial Assessment:   Oxygen Re-Evaluation:   Oxygen Discharge (Final Oxygen Re-Evaluation):   Initial Exercise Prescription: Initial Exercise Prescription - 02/05/19 1300      Date of Initial Exercise RX and Referring Provider   Date  02/05/19    Referring Provider  [facility]      Treadmill   MPH  2    Grade  0    Minutes  15    METs  2.5      Recumbant Bike   Level  2    RPM  60    Watts  15    Minutes  15    METs  2.5      NuStep   Level  3    SPM  80    Minutes  15    METs  2.5      Prescription Details   Frequency (times per week)  3    Duration  Progress to 30 minutes of continuous aerobic without signs/symptoms of physical distress      Intensity   THRR 40-80% of Max Heartrate  95-126    Ratings of Perceived Exertion  11-15    Perceived Dyspnea  0-4      Resistance Training   Training Prescription  Yes    Weight  3 lb    Reps  10-15       Perform Capillary Blood Glucose checks as needed.  Exercise Prescription Changes: Exercise Prescription Changes    Row Name  02/05/19 1300             Response to Exercise   Blood Pressure (Admit)  130/58       Blood Pressure (Exercise)  164/46       Blood Pressure (Exit)  138/40       Heart Rate (Admit)  65 bpm       Heart Rate (Exercise)  103 bpm       Heart Rate (Exit)  77 bpm       Oxygen Saturation (Admit)  99 %       Oxygen Saturation (Exit)  99 %       Rating of Perceived Exertion (Exercise)  17       Perceived Dyspnea (Exercise)  3          Exercise Comments:   Exercise Goals and Review: Exercise Goals    Row Name 02/05/19 1343  Exercise Goals   Increase Physical Activity  Yes       Intervention  Provide advice, education, support and counseling about physical activity/exercise needs.;Develop an individualized exercise prescription for aerobic and resistive training based on initial evaluation findings, risk stratification, comorbidities and participant's personal goals.       Expected Outcomes  Short Term: Attend rehab on a regular basis to increase amount of physical activity.;Long Term: Add in home exercise to make exercise part of routine and to increase amount of physical activity.;Long Term: Exercising regularly at least 3-5 days a week.       Increase Strength and Stamina  Yes       Intervention  Provide advice, education, support and counseling about physical activity/exercise needs.;Develop an individualized exercise prescription for aerobic and resistive training based on initial evaluation findings, risk stratification, comorbidities and participant's personal goals.       Expected Outcomes  Short Term: Increase workloads from initial exercise prescription for resistance, speed, and METs.;Short Term: Perform resistance training exercises routinely during rehab and add in resistance training at home;Long Term: Improve cardiorespiratory fitness, muscular endurance and strength as measured by increased METs and functional capacity (6MWT)       Able to understand and use rate  of perceived exertion (RPE) scale  Yes       Intervention  Provide education and explanation on how to use RPE scale       Expected Outcomes  Short Term: Able to use RPE daily in rehab to express subjective intensity level;Long Term:  Able to use RPE to guide intensity level when exercising independently       Knowledge and understanding of Target Heart Rate Range (THRR)  Yes       Intervention  Provide education and explanation of THRR including how the numbers were predicted and where they are located for reference       Expected Outcomes  Short Term: Able to state/look up THRR;Short Term: Able to use daily as guideline for intensity in rehab;Long Term: Able to use THRR to govern intensity when exercising independently       Able to check pulse independently  Yes       Intervention  Provide education and demonstration on how to check pulse in carotid and radial arteries.;Review the importance of being able to check your own pulse for safety during independent exercise       Expected Outcomes  Short Term: Able to explain why pulse checking is important during independent exercise;Long Term: Able to check pulse independently and accurately       Understanding of Exercise Prescription  Yes       Intervention  Provide education, explanation, and written materials on patient's individual exercise prescription       Expected Outcomes  Short Term: Able to explain program exercise prescription;Long Term: Able to explain home exercise prescription to exercise independently          Exercise Goals Re-Evaluation :   Discharge Exercise Prescription (Final Exercise Prescription Changes): Exercise Prescription Changes - 02/05/19 1300      Response to Exercise   Blood Pressure (Admit)  130/58    Blood Pressure (Exercise)  164/46    Blood Pressure (Exit)  138/40    Heart Rate (Admit)  65 bpm    Heart Rate (Exercise)  103 bpm    Heart Rate (Exit)  77 bpm    Oxygen Saturation (Admit)  99 %    Oxygen  Saturation (Exit)  99 %    Rating of Perceived Exertion (Exercise)  17    Perceived Dyspnea (Exercise)  3       Nutrition:  Target Goals: Understanding of nutrition guidelines, daily intake of sodium <1560m, cholesterol <2043m calories 30% from fat and 7% or less from saturated fats, daily to have 5 or more servings of fruits and vegetables.  Biometrics: Pre Biometrics - 02/05/19 1333      Pre Biometrics   Height  5' 6.5" (1.689 m)    Weight  198 lb 12.8 oz (90.2 kg)    Waist Circumference  44 inches    Hip Circumference  39 inches    Waist to Hip Ratio  1.13 %    BMI (Calculated)  31.61    Single Leg Stand  2.45 seconds        Nutrition Therapy Plan and Nutrition Goals: Nutrition Therapy & Goals - 02/05/19 1511      Intervention Plan   Intervention  Prescribe, educate and counsel regarding individualized specific dietary modifications aiming towards targeted core components such as weight, hypertension, lipid management, diabetes, heart failure and other comorbidities.;Nutrition handout(s) given to patient.    Expected Outcomes  Short Term Goal: Understand basic principles of dietary content, such as calories, fat, sodium, cholesterol and nutrients.;Short Term Goal: A plan has been developed with personal nutrition goals set during dietitian appointment.;Long Term Goal: Adherence to prescribed nutrition plan.       Nutrition Assessments: Nutrition Assessments - 02/05/19 1511      MEDFICTS Scores   Pre Score  18       Nutrition Goals Re-Evaluation:   Nutrition Goals Discharge (Final Nutrition Goals Re-Evaluation):   Psychosocial: Target Goals: Acknowledge presence or absence of significant depression and/or stress, maximize coping skills, provide positive support system. Participant is able to verbalize types and ability to use techniques and skills needed for reducing stress and depression.   Initial Review & Psychosocial Screening: Initial Psych Review &  Screening - 02/05/19 1505      Initial Review   Current issues with  Current Stress Concerns;Current Sleep Concerns   It is very hard for Mike Decker to fall asleep, has been that way for years he said. He sometimes doesn't fall asleep until like 5 am. He paces at night trying to wear himself out   Source of Stress Concerns  Chronic Illness;Family;Unable to perform yard/household activities    Comments  ScNicki Reaperas twin [id number] old boys that have Down Syndrome. He is feeling guilty that his wife is having to care for him and their boys. He has not driven since his stroke a while back, but is going to take [employer] to attend classes. He really wants to get back to doing more around the house and being a better helper.       Family Dynamics   Good Support System?  Yes   wife     Barriers   Psychosocial barriers to participate in program  There are no identifiable barriers or psychosocial needs.;The patient should benefit from training in stress management and relaxation.      Screening Interventions   Interventions  Encouraged to exercise;Program counselor consult;Provide feedback about the scores to participant;To provide support and resources with identified psychosocial needs    Expected Outcomes  Short Term goal: Utilizing psychosocial counselor, staff and physician to assist with identification of specific Stressors or current issues interfering with healing process. Setting desired goal for each stressor or current issue identified.;Long Term  Goal: Stressors or current issues are controlled or eliminated.;Short Term goal: Identification and review with participant of any Quality of Life or Depression concerns found by scoring the questionnaire.;Long Term goal: The participant improves quality of Life and PHQ9 Scores as seen by post scores and/or verbalization of changes       Quality of Life Scores:  Quality of Life - 02/05/19 1507      Quality of Life   Select  Quality of Life      Quality of  Life Scores   Health/Function Pre  14.07 %    Socioeconomic Pre  30 %    Psych/Spiritual Pre  14.36 %    Family Pre  24 %    GLOBAL Pre  18.53 %      Scores of 19 and below usually indicate a poorer quality of life in these areas.  A difference of  2-3 points is a clinically meaningful difference.  A difference of 2-3 points in the total score of the Quality of Life Index has been associated with significant improvement in overall quality of life, self-image, physical symptoms, and general health in studies assessing change in quality of life.  PHQ-9: Recent Review Flowsheet Data    Depression screen [facility] 2/9 02/05/2019 09/04/2018 02/27/2018   Decreased Interest 1 0 0   Down, Depressed, Hopeless 1 0 0   PHQ - 2 Score 2 0 0   Altered sleeping 3 - -   Tired, decreased energy 0 - -   Feeling bad or failure about yourself  1 - -   Trouble concentrating 0 - -   Moving slowly or fidgety/restless 0 - -   Suicidal thoughts 0 - -   PHQ-9 Score 6 - -   Difficult doing work/chores Somewhat difficult - -     Interpretation of Total Score  Total Score Depression Severity:  1-4 = Minimal depression, 5-9 = Mild depression, 10-14 = Moderate depression, 15-19 = Moderately severe depression, 20-27 = Severe depression   Psychosocial Evaluation and Intervention:   Psychosocial Re-Evaluation:   Psychosocial Discharge (Final Psychosocial Re-Evaluation):   Vocational Rehabilitation: Provide vocational rehab assistance to qualifying candidates.   Vocational Rehab Evaluation & Intervention: Vocational Rehab - 02/05/19 1504      Initial Vocational Rehab Evaluation & Intervention   Assessment shows need for Vocational Rehabilitation  No       Education: Education Goals: Education classes will be provided on a variety of topics geared toward better understanding of heart health and risk factor modification. Participant will state understanding/return demonstration of topics presented as noted by  education test scores.  Learning Barriers/Preferences: Learning Barriers/Preferences - 02/05/19 1503      Learning Barriers/Preferences   Learning Barriers  Sight;Hearing    Learning Preferences  Skilled Demonstration;Video;Verbal Instruction       Education Topics:  AED/CPR: - Group verbal and written instruction with the use of models to demonstrate the basic use of the AED with the basic ABC's of resuscitation.   General Nutrition Guidelines/Fats and Fiber: -Group instruction provided by verbal, written material, models and posters to present the general guidelines for heart healthy nutrition. Gives an explanation and review of dietary fats and fiber.   Controlling Sodium/Reading Food Labels: -Group verbal and written material supporting the discussion of sodium use in heart healthy nutrition. Review and explanation with models, verbal and written materials for utilization of the food label.   Exercise Physiology & General Exercise Guidelines: - Group verbal and written  instruction with models to review the exercise physiology of the cardiovascular system and associated critical values. Provides general exercise guidelines with specific guidelines to those with heart or lung disease.    Aerobic Exercise & Resistance Training: - Gives group verbal and written instruction on the various components of exercise. Focuses on aerobic and resistive training programs and the benefits of this training and how to safely progress through these programs..   Flexibility, Balance, Mind/Body Relaxation: Provides group verbal/written instruction on the benefits of flexibility and balance training, including mind/body exercise modes such as yoga, pilates and tai chi.  Demonstration and skill practice provided.   Stress and Anxiety: - Provides group verbal and written instruction about the health risks of elevated stress and causes of high stress.  Discuss the correlation between heart/lung  disease and anxiety and treatment options. Review healthy ways to manage with stress and anxiety.   Depression: - Provides group verbal and written instruction on the correlation between heart/lung disease and depressed mood, treatment options, and the stigmas associated with seeking treatment.   Anatomy & Physiology of the Heart: - Group verbal and written instruction and models provide basic cardiac anatomy and physiology, with the coronary electrical and arterial systems. Review of Valvular disease and Heart Failure   Cardiac Procedures: - Group verbal and written instruction to review commonly prescribed medications for heart disease. Reviews the medication, class of the drug, and side effects. Includes the steps to properly store meds and maintain the prescription regimen. (beta blockers and nitrates)   Cardiac Medications I: - Group verbal and written instruction to review commonly prescribed medications for heart disease. Reviews the medication, class of the drug, and side effects. Includes the steps to properly store meds and maintain the prescription regimen.   Cardiac Medications II: -Group verbal and written instruction to review commonly prescribed medications for heart disease. Reviews the medication, class of the drug, and side effects. (all other drug classes)    Go Sex-Intimacy & Heart Disease, Get SMART - Goal Setting: - Group verbal and written instruction through game format to discuss heart disease and the return to sexual intimacy. Provides group verbal and written material to discuss and apply goal setting through the application of the S.M.A.R.T. Method.   Other Matters of the Heart: - Provides group verbal, written materials and models to describe Stable Angina and Peripheral Artery. Includes description of the disease process and treatment options available to the cardiac patient.   Exercise & Equipment Safety: - Individual verbal instruction and  demonstration of equipment use and safety with use of the equipment.   Cardiac Rehab from 02/05/2019 in [facility] Cardiac and Pulmonary Rehab  Date  02/05/19  Educator  [facility]  Instruction Review Code  1- Verbalizes Understanding      Infection Prevention: - Provides verbal and written material to individual with discussion of infection control including proper hand washing and proper equipment cleaning during exercise session.   Cardiac Rehab from 02/05/2019 in [facility] Cardiac and Pulmonary Rehab  Date  02/05/19  Educator  [facility]  Instruction Review Code  1- Verbalizes Understanding      Falls Prevention: - Provides verbal and written material to individual with discussion of falls prevention and safety.   Cardiac Rehab from 02/05/2019 in [facility] Cardiac and Pulmonary Rehab  Date  02/05/19  Educator  [facility]  Instruction Review Code  1- Verbalizes Understanding      Diabetes: - Individual verbal and written instruction to review signs/symptoms of diabetes, desired ranges  of glucose level fasting, after meals and with exercise. Acknowledge that pre and post exercise glucose checks will be done for 3 sessions at entry of program.   Cardiac Rehab from 02/05/2019 in [facility] Cardiac and Pulmonary Rehab  Date  02/05/19  Educator  [facility]  Instruction Review Code  1- Verbalizes Understanding      Know Your Numbers and Risk Factors: -Group verbal and written instruction about important numbers in your health.  Discussion of what are risk factors and how they play a role in the disease process.  Review of Cholesterol, Blood Pressure, Diabetes, and BMI and the role they play in your overall health.   Sleep Hygiene: -Provides group verbal and written instruction about how sleep can affect your health.  Define sleep hygiene, discuss sleep cycles and impact of sleep habits. Review good sleep hygiene tips.    Other: -Provides group and verbal instruction on various topics (see comments)   Knowledge Questionnaire  Score: Knowledge Questionnaire Score - 02/05/19 1503      Knowledge Questionnaire Score   Pre Score  22/26   correct answers reviewed with Mike Decker. Focus on exerice and nutrition      Core Components/Risk Factors/Patient Goals at Admission: Personal Goals and Risk Factors at Admission - 02/05/19 1502      Core Components/Risk Factors/Patient Goals on Admission    Weight Management  Yes;Weight Loss    Intervention  Weight Management: Develop a combined nutrition and exercise program designed to reach desired caloric intake, while maintaining appropriate intake of nutrient and fiber, sodium and fats, and appropriate energy expenditure required for the weight goal.;Weight Management: Provide education and appropriate resources to help participant work on and attain dietary goals.;Weight Management/Obesity: Establish reasonable short term and long term weight goals.    Admit Weight  198 lb 12.8 oz (90.2 kg)    Goal Weight: Short Term  194 lb (88 kg)    Goal Weight: Long Term  170 lb (77.1 kg)    Expected Outcomes  Short Term: Continue to assess and modify interventions until short term weight is achieved;Long Term: Adherence to nutrition and physical activity/exercise program aimed toward attainment of established weight goal;Weight Loss: Understanding of general recommendations for a balanced deficit meal plan, which promotes 1-2 lb weight loss per week and includes a negative energy balance of [phone] kcal/d;Understanding recommendations for meals to include 15-35% energy as protein, 25-35% energy from fat, 35-60% energy from carbohydrates, less than 270m of dietary cholesterol, 20-35 gm of total fiber daily;Understanding of distribution of calorie intake throughout the day with the consumption of 4-5 meals/snacks    Diabetes  Yes    Intervention  Provide education about signs/symptoms and action to take for hypo/hyperglycemia.;Provide education about proper nutrition, including hydration, and  aerobic/resistive exercise prescription along with prescribed medications to achieve blood glucose in normal ranges: Fasting glucose 65-99 mg/dL    Expected Outcomes  Long Term: Attainment of HbA1C < 7%.;Short Term: Participant verbalizes understanding of the signs/symptoms and immediate care of hyper/hypoglycemia, proper foot care and importance of medication, aerobic/resistive exercise and nutrition plan for blood glucose control.    Heart Failure  Yes    Intervention  Provide a combined exercise and nutrition program that is supplemented with education, support and counseling about heart failure. Directed toward relieving symptoms such as shortness of breath, decreased exercise tolerance, and extremity edema.    Expected Outcomes  Improve functional capacity of life;Short term: Attendance in program 2-3 days a week with increased exercise  capacity. Reported lower sodium intake. Reported increased fruit and vegetable intake. Reports medication compliance.;Short term: Daily weights obtained and reported for increase. Utilizing diuretic protocols set by physician.;Long term: Adoption of self-care skills and reduction of barriers for early signs and symptoms recognition and intervention leading to self-care maintenance.    Hypertension  Yes    Intervention  Provide education on lifestyle modifcations including regular physical activity/exercise, weight management, moderate sodium restriction and increased consumption of fresh fruit, vegetables, and low fat dairy, alcohol moderation, and smoking cessation.;Monitor prescription use compliance.    Expected Outcomes  Short Term: Continued assessment and intervention until BP is < 140/82m HG in hypertensive participants. < 130/8106mHG in hypertensive participants with diabetes, heart failure or chronic kidney disease.;Long Term: Maintenance of blood pressure at goal levels.    Lipids  Yes    Intervention  Provide education and support for participant on  nutrition & aerobic/resistive exercise along with prescribed medications to achieve LDL <706mHDL >53m15m  Expected Outcomes  Short Term: Participant states understanding of desired cholesterol values and is compliant with medications prescribed. Participant is following exercise prescription and nutrition guidelines.;Long Term: Cholesterol controlled with medications as prescribed, with individualized exercise RX and with personalized nutrition plan. Value goals: LDL < 70mg58mL > 40 mg.       Core Components/Risk Factors/Patient Goals Review:    Core Components/Risk Factors/Patient Goals at Discharge (Final Review):    ITP Comments: ITP Comments    Row Name 02/05/19 1440 02/17/19 1540         ITP Comments  Med Review completed. Initial ITP created. Diagnosis can be found in [facility]  Mike Decker like to be dropped from [facility] due to sciatica and he is concerned about coronavirus with all his health conditions.         Comments: Discharge ITP Only attended orientation.

## 2019-02-19 ENCOUNTER — Ambulatory Visit: Payer: Medicare Other

## 2019-02-19 DIAGNOSIS — M5432 Sciatica, left side: Secondary | ICD-10-CM | POA: Diagnosis not present

## 2019-02-19 DIAGNOSIS — M533 Sacrococcygeal disorders, not elsewhere classified: Secondary | ICD-10-CM | POA: Diagnosis not present

## 2019-02-23 DIAGNOSIS — R809 Proteinuria, unspecified: Secondary | ICD-10-CM | POA: Diagnosis not present

## 2019-02-23 DIAGNOSIS — N183 Chronic kidney disease, stage 3 (moderate): Secondary | ICD-10-CM | POA: Diagnosis not present

## 2019-02-23 DIAGNOSIS — E1122 Type 2 diabetes mellitus with diabetic chronic kidney disease: Secondary | ICD-10-CM | POA: Diagnosis not present

## 2019-02-23 DIAGNOSIS — N2581 Secondary hyperparathyroidism of renal origin: Secondary | ICD-10-CM | POA: Diagnosis not present

## 2019-02-23 DIAGNOSIS — M533 Sacrococcygeal disorders, not elsewhere classified: Secondary | ICD-10-CM | POA: Diagnosis not present

## 2019-02-23 DIAGNOSIS — M47816 Spondylosis without myelopathy or radiculopathy, lumbar region: Secondary | ICD-10-CM | POA: Diagnosis not present

## 2019-02-23 DIAGNOSIS — M5116 Intervertebral disc disorders with radiculopathy, lumbar region: Secondary | ICD-10-CM | POA: Diagnosis not present

## 2019-02-23 DIAGNOSIS — I129 Hypertensive chronic kidney disease with stage 1 through stage 4 chronic kidney disease, or unspecified chronic kidney disease: Secondary | ICD-10-CM | POA: Diagnosis not present

## 2019-02-23 DIAGNOSIS — M5126 Other intervertebral disc displacement, lumbar region: Secondary | ICD-10-CM | POA: Diagnosis not present

## 2019-02-24 ENCOUNTER — Ambulatory Visit: Payer: Medicare Other | Admitting: Family

## 2019-02-26 ENCOUNTER — Ambulatory Visit: Payer: Medicare Other

## 2019-02-26 DIAGNOSIS — M25561 Pain in right knee: Secondary | ICD-10-CM | POA: Diagnosis not present

## 2019-02-26 DIAGNOSIS — M169 Osteoarthritis of hip, unspecified: Secondary | ICD-10-CM | POA: Diagnosis not present

## 2019-02-26 DIAGNOSIS — M5136 Other intervertebral disc degeneration, lumbar region: Secondary | ICD-10-CM | POA: Diagnosis not present

## 2019-03-03 ENCOUNTER — Ambulatory Visit: Payer: Medicare Other

## 2019-03-05 ENCOUNTER — Ambulatory Visit: Payer: Medicare Other

## 2019-03-10 ENCOUNTER — Telehealth: Payer: Self-pay | Admitting: Gastroenterology

## 2019-03-10 ENCOUNTER — Ambulatory Visit: Payer: Medicare Other

## 2019-03-12 ENCOUNTER — Ambulatory Visit: Payer: Medicare Other

## 2019-03-13 NOTE — Telephone Encounter (Signed)
ERROR

## 2019-03-17 ENCOUNTER — Other Ambulatory Visit: Payer: Self-pay | Admitting: Internal Medicine

## 2019-03-17 ENCOUNTER — Ambulatory Visit: Payer: Medicare Other

## 2019-03-17 DIAGNOSIS — R229 Localized swelling, mass and lump, unspecified: Secondary | ICD-10-CM

## 2019-03-17 DIAGNOSIS — IMO0002 Reserved for concepts with insufficient information to code with codable children: Secondary | ICD-10-CM

## 2019-03-17 DIAGNOSIS — N6310 Unspecified lump in the right breast, unspecified quadrant: Secondary | ICD-10-CM

## 2019-03-19 ENCOUNTER — Ambulatory Visit: Payer: Medicare Other

## 2019-03-23 ENCOUNTER — Ambulatory Visit
Admission: RE | Admit: 2019-03-23 | Discharge: 2019-03-23 | Disposition: A | Discharge status: Home / Self Care | Payer: Medicare Other | Source: Ambulatory Visit | Attending: Internal Medicine | Admitting: Internal Medicine

## 2019-03-23 ENCOUNTER — Other Ambulatory Visit: Payer: Self-pay

## 2019-03-23 DIAGNOSIS — R229 Localized swelling, mass and lump, unspecified: Secondary | ICD-10-CM

## 2019-03-23 DIAGNOSIS — N62 Hypertrophy of breast: Secondary | ICD-10-CM | POA: Diagnosis not present

## 2019-03-23 DIAGNOSIS — N6310 Unspecified lump in the right breast, unspecified quadrant: Secondary | ICD-10-CM

## 2019-03-23 DIAGNOSIS — N6341 Unspecified lump in right breast, subareolar: Secondary | ICD-10-CM | POA: Insufficient documentation

## 2019-03-23 DIAGNOSIS — IMO0002 Reserved for concepts with insufficient information to code with codable children: Secondary | ICD-10-CM

## 2019-03-24 ENCOUNTER — Ambulatory Visit: Payer: Medicare Other

## 2019-03-26 ENCOUNTER — Ambulatory Visit: Payer: Medicare Other

## 2019-03-30 ENCOUNTER — Ambulatory Visit: Payer: Medicare Other | Admitting: Oncology

## 2019-03-30 ENCOUNTER — Other Ambulatory Visit: Payer: Medicare Other

## 2019-03-31 ENCOUNTER — Ambulatory Visit: Payer: Medicare Other

## 2019-04-02 ENCOUNTER — Inpatient Hospital Stay: Payer: Medicare Other | Admitting: Oncology

## 2019-04-02 ENCOUNTER — Inpatient Hospital Stay: Payer: Medicare Other

## 2019-04-07 ENCOUNTER — Ambulatory Visit: Payer: Medicare Other

## 2019-04-09 ENCOUNTER — Ambulatory Visit: Payer: Medicare Other

## 2019-04-14 ENCOUNTER — Ambulatory Visit: Payer: Medicare Other

## 2019-04-16 ENCOUNTER — Ambulatory Visit: Payer: Medicare Other

## 2019-04-21 ENCOUNTER — Ambulatory Visit: Payer: Medicare Other

## 2019-04-23 ENCOUNTER — Ambulatory Visit: Payer: Self-pay | Admitting: Internal Medicine

## 2019-04-28 ENCOUNTER — Ambulatory Visit: Payer: Medicare Other

## 2019-04-30 ENCOUNTER — Ambulatory Visit: Payer: Medicare Other

## 2019-05-05 ENCOUNTER — Other Ambulatory Visit: Payer: Self-pay

## 2019-05-05 ENCOUNTER — Ambulatory Visit (INDEPENDENT_AMBULATORY_CARE_PROVIDER_SITE_OTHER): Payer: Medicare Other

## 2019-05-05 ENCOUNTER — Ambulatory Visit (INDEPENDENT_AMBULATORY_CARE_PROVIDER_SITE_OTHER): Payer: Medicare Other | Admitting: Vascular Surgery

## 2019-05-05 DIAGNOSIS — I739 Peripheral vascular disease, unspecified: Secondary | ICD-10-CM | POA: Diagnosis not present

## 2019-05-05 DIAGNOSIS — I6523 Occlusion and stenosis of bilateral carotid arteries: Secondary | ICD-10-CM | POA: Diagnosis not present

## 2019-05-05 DIAGNOSIS — I10 Essential (primary) hypertension: Secondary | ICD-10-CM

## 2019-05-05 DIAGNOSIS — E119 Type 2 diabetes mellitus without complications: Secondary | ICD-10-CM | POA: Diagnosis not present

## 2019-05-06 ENCOUNTER — Other Ambulatory Visit: Payer: Self-pay

## 2019-05-06 ENCOUNTER — Inpatient Hospital Stay (HOSPITAL_BASED_OUTPATIENT_CLINIC_OR_DEPARTMENT_OTHER): Payer: Medicare Other | Admitting: Oncology

## 2019-05-06 ENCOUNTER — Inpatient Hospital Stay: Payer: Medicare Other | Attending: Oncology

## 2019-05-06 ENCOUNTER — Encounter: Payer: Self-pay | Admitting: Oncology

## 2019-05-06 VITALS — BP 158/72 | HR 60 | Temp 98.6°F | Resp 18 | Wt 201.3 lb

## 2019-05-06 DIAGNOSIS — Z794 Long term (current) use of insulin: Secondary | ICD-10-CM | POA: Diagnosis not present

## 2019-05-06 DIAGNOSIS — I130 Hypertensive heart and chronic kidney disease with heart failure and stage 1 through stage 4 chronic kidney disease, or unspecified chronic kidney disease: Secondary | ICD-10-CM

## 2019-05-06 DIAGNOSIS — N189 Chronic kidney disease, unspecified: Secondary | ICD-10-CM | POA: Insufficient documentation

## 2019-05-06 DIAGNOSIS — E538 Deficiency of other specified B group vitamins: Secondary | ICD-10-CM

## 2019-05-06 DIAGNOSIS — Z79899 Other long term (current) drug therapy: Secondary | ICD-10-CM | POA: Insufficient documentation

## 2019-05-06 DIAGNOSIS — E1151 Type 2 diabetes mellitus with diabetic peripheral angiopathy without gangrene: Secondary | ICD-10-CM | POA: Diagnosis not present

## 2019-05-06 DIAGNOSIS — Z955 Presence of coronary angioplasty implant and graft: Secondary | ICD-10-CM | POA: Insufficient documentation

## 2019-05-06 DIAGNOSIS — E785 Hyperlipidemia, unspecified: Secondary | ICD-10-CM | POA: Insufficient documentation

## 2019-05-06 DIAGNOSIS — D631 Anemia in chronic kidney disease: Secondary | ICD-10-CM

## 2019-05-06 DIAGNOSIS — N62 Hypertrophy of breast: Secondary | ICD-10-CM | POA: Insufficient documentation

## 2019-05-06 DIAGNOSIS — G4730 Sleep apnea, unspecified: Secondary | ICD-10-CM | POA: Insufficient documentation

## 2019-05-06 DIAGNOSIS — I252 Old myocardial infarction: Secondary | ICD-10-CM | POA: Diagnosis not present

## 2019-05-06 DIAGNOSIS — Z87891 Personal history of nicotine dependence: Secondary | ICD-10-CM | POA: Insufficient documentation

## 2019-05-06 DIAGNOSIS — M109 Gout, unspecified: Secondary | ICD-10-CM | POA: Diagnosis not present

## 2019-05-06 DIAGNOSIS — D500 Iron deficiency anemia secondary to blood loss (chronic): Secondary | ICD-10-CM | POA: Diagnosis not present

## 2019-05-06 DIAGNOSIS — M1990 Unspecified osteoarthritis, unspecified site: Secondary | ICD-10-CM | POA: Insufficient documentation

## 2019-05-06 DIAGNOSIS — Z85828 Personal history of other malignant neoplasm of skin: Secondary | ICD-10-CM | POA: Insufficient documentation

## 2019-05-06 LAB — CBC WITH DIFFERENTIAL/PLATELET
Abs Immature Granulocytes: 0.09 10*3/uL — ABNORMAL HIGH (ref 0.00–0.07)
Basophils Absolute: 0.1 10*3/uL (ref 0.0–0.1)
Basophils Relative: 1 %
Eosinophils Absolute: 0.7 10*3/uL — ABNORMAL HIGH (ref 0.0–0.5)
Eosinophils Relative: 9 %
HCT: 36.3 % — ABNORMAL LOW (ref 39.0–52.0)
Hemoglobin: 12.3 g/dL — ABNORMAL LOW (ref 13.0–17.0)
Immature Granulocytes: 1 %
Lymphocytes Relative: 16 %
Lymphs Abs: 1.3 10*3/uL (ref 0.7–4.0)
MCH: 31.5 pg (ref 26.0–34.0)
MCHC: 33.9 g/dL (ref 30.0–36.0)
MCV: 92.8 fL (ref 80.0–100.0)
Monocytes Absolute: 0.8 10*3/uL (ref 0.1–1.0)
Monocytes Relative: 10 %
Neutro Abs: 5 10*3/uL (ref 1.7–7.7)
Neutrophils Relative %: 63 %
Platelets: 209 10*3/uL (ref 150–400)
RBC: 3.91 MIL/uL — ABNORMAL LOW (ref 4.22–5.81)
RDW: 14.2 % (ref 11.5–15.5)
WBC: 8 10*3/uL (ref 4.0–10.5)
nRBC: 0 % (ref 0.0–0.2)

## 2019-05-06 LAB — COMPREHENSIVE METABOLIC PANEL
ALT: 14 U/L (ref 0–44)
AST: 16 U/L (ref 15–41)
Albumin: 4.2 g/dL (ref 3.5–5.0)
Alkaline Phosphatase: 71 U/L (ref 38–126)
Anion gap: 10 (ref 5–15)
BUN: 44 mg/dL — ABNORMAL HIGH (ref 8–23)
CO2: 22 mmol/L (ref 22–32)
Calcium: 9 mg/dL (ref 8.9–10.3)
Chloride: 103 mmol/L (ref 98–111)
Creatinine, Ser: 1.99 mg/dL — ABNORMAL HIGH (ref 0.61–1.24)
GFR calc Af Amer: 36 mL/min — ABNORMAL LOW (ref >60)
GFR calc non Af Amer: 31 mL/min — ABNORMAL LOW (ref >60)
Glucose, Bld: 246 mg/dL — ABNORMAL HIGH (ref 70–99)
Potassium: 4.1 mmol/L (ref 3.5–5.1)
Sodium: 135 mmol/L (ref 135–145)
Total Bilirubin: 0.5 mg/dL (ref 0.3–1.2)
Total Protein: 6.8 g/dL (ref 6.5–8.1)

## 2019-05-06 LAB — FERRITIN: Ferritin: 87 ng/mL (ref 24–336)

## 2019-05-06 LAB — IRON AND TIBC
Iron: 72 ug/dL (ref 45–182)
Saturation Ratios: 22 % (ref 17.9–39.5)
TIBC: 321 ug/dL (ref 250–450)
UIBC: 249 ug/dL

## 2019-05-06 LAB — VITAMIN B12: Vitamin B-12: 715 pg/mL (ref 180–914)

## 2019-05-06 NOTE — Progress Notes (Signed)
Patient here for follow up. Pt had mammogram about 3 weeks ago due to pain below right nipple and they told him he had gynecomastia. Pt wondering if any of his medications can cause this. Right nipple tender to touch.

## 2019-05-07 ENCOUNTER — Other Ambulatory Visit (INDEPENDENT_AMBULATORY_CARE_PROVIDER_SITE_OTHER): Payer: Self-pay | Admitting: Vascular Surgery

## 2019-05-07 DIAGNOSIS — I739 Peripheral vascular disease, unspecified: Secondary | ICD-10-CM

## 2019-05-07 DIAGNOSIS — I6523 Occlusion and stenosis of bilateral carotid arteries: Secondary | ICD-10-CM

## 2019-05-07 DIAGNOSIS — Z95820 Peripheral vascular angioplasty status with implants and grafts: Secondary | ICD-10-CM

## 2019-05-10 NOTE — Progress Notes (Signed)
Hematology/Oncology Follow Up Note [facility]  [phone] [phone]  Patient Care Team: Cletis Athens, MD as PCP - General (Cardiology) Cletis Athens, MD as Referring Physician (Cardiology) Christene Lye, MD (General Surgery) Alisa Graff, FNP as Nurse Practitioner (Family Medicine) Yolonda Kida, MD as Consulting Physician (Cardiology) Gabriel Carina, Betsey Holiday, MD as Physician Assistant (Endocrinology) Lavonia Dana, MD as Consulting Physician (Nephrology) Allyne Gee, MD as Consulting Physician (Pulmonary Disease) [name], MD as Consulting Physician (Gastroenterology)   Name of the patient: Mike Decker  [id number]  Apr 10, 1941   REASON FOR VISIT  follow-up for management of iron deficiency anemia, anemia and CKD, recent hospitalization   PERTINENT HEMATOLOGY HISTORY received IV Venofer daily x3 as well as multiple units of PRBC transfusion. GI was consulted and patient had EGD done during his hospitalization.  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  Treated with argon plasma coagulation.  Colonoscopy showed polyps which were removed. Patient was discharged on 12/15/2018.  INTERVAL HISTORY 78 y.o. male with PMH listed as below present to follow-up for anemia of CKD.  Recently he felt right nipple pain, and had mammogram 3 weeks ago.  03/23/2019 mammogram showed gynecomastia.  Energy level has been stable. Fatigue at baseline.    Review of Systems  Constitutional: Positive for fatigue. Negative for appetite change, chills, fever and unexpected weight change.  HENT:   Negative for hearing loss and voice change.   Eyes: Negative for eye problems and icterus.  Respiratory: Negative for chest tightness, cough and shortness of breath.   Cardiovascular: Negative for chest pain and leg swelling.  Gastrointestinal: Negative for abdominal distention and abdominal pain.  Endocrine: Negative for hot flashes.    Genitourinary: Negative for difficulty urinating, dysuria and frequency.   Musculoskeletal: Negative for arthralgias.  Skin: Negative for itching and rash.  Neurological: Negative for light-headedness and numbness.  Hematological: Negative for adenopathy. Does not bruise/bleed easily.  Psychiatric/Behavioral: Negative for confusion.  right breast nipple pain.    Allergies  Allergen Reactions   Gabapentin     Other reaction(s): Other (See Comments) Tremors   Peanut-Containing Drug Products Anaphylaxis   Penicillins Hives and Rash    Has patient had a PCN reaction causing immediate rash, facial/tongue/throat swelling, SOB or lightheadedness with hypotension: Yes Has patient had a PCN reaction causing severe rash involving mucus membranes or skin necrosis: No Has patient had a PCN reaction that required hospitalization No Has patient had a PCN reaction occurring within the last 10 years: No If all of the above answers are "NO", then may proceed with Cephalosporin use.   Bee Venom Swelling   Influenza Vaccines Hives   Inh [Isoniazid] Hives   Kenalog [Triamcinolone Acetonide] Hives   Levaquin [Levofloxacin] Other (See Comments)    Tendon, ligament pain.    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, esphageal   Chronic diastolic CHF (congestive heart failure) (HCC)    Colon polyp    Diabetes mellitus without complication ([place])    Diverticulosis    Gout    Heart disease    Hemangioma    liver   Hyperlipidemia  Hypertension    Myocardial infarct [facility])    Ocular hypertension    Peripheral vascular disease ([place])    Skin cancer    Sleep apnea    Vitreoretinal degeneration      Past Surgical History:   Procedure Laterality Date   CATARACT EXTRACTION  2011, 2012   COLONOSCOPY N/A 12/14/2018   Procedure: COLONOSCOPY;  Surgeon: Virgel Manifold, MD;  Location: [facility] ENDOSCOPY;  Service: Endoscopy;  Laterality: N/A;   CORONARY ANGIOPLASTY WITH STENT PLACEMENT  2012   ESOPHAGOGASTRODUODENOSCOPY N/A 12/14/2018   Procedure: ESOPHAGOGASTRODUODENOSCOPY (EGD);  Surgeon: Virgel Manifold, MD;  Location: [facility] ENDOSCOPY;  Service: Endoscopy;  Laterality: N/A;   ESOPHAGOGASTRODUODENOSCOPY (EGD) WITH PROPOFOL N/A 06/01/2016   Procedure: ESOPHAGOGASTRODUODENOSCOPY (EGD) WITH PROPOFOL;  Surgeon: Manya Silvas, MD;  Location: [facility] ENDOSCOPY;  Service: Endoscopy;  Laterality: N/A;   HERNIA REPAIR     x2   INTRAOCULAR LENS INSERTION     LEFT HEART CATH AND CORONARY ANGIOGRAPHY N/A 05/09/2017   Procedure: Left Heart Cath and Coronary Angiography;  Surgeon: Yolonda Kida, MD;  Location: [place] CV LAB;  Service: Cardiovascular;  Laterality: N/A;   LEFT HEART CATH AND CORONARY ANGIOGRAPHY N/A 12/08/2018   Procedure: LEFT HEART CATH AND CORONARY ANGIOGRAPHY and possible PCI and stent;  Surgeon: Yolonda Kida, MD;  Location: [place] CV LAB;  Service: Cardiovascular;  Laterality: N/A;   NOSE SURGERY     submucous resection   PROSTATE SURGERY  2002   ROTATOR CUFF REPAIR Right     Social History   Socioeconomic History   Marital status: Married    Spouse name: Not on file   Number of children: Not on file   Years of education: Not on file   Highest education level: Not on file  Occupational History   Not on file  Social Needs   Financial resource strain: Not on file   Food insecurity:    Worry: Not on file    Inability: Not on file   Transportation needs:    Medical: Not on file    Non-medical: Not on file  Tobacco Use   Smoking status: Former Smoker    Years: 20.00    Types: Cigarettes    Last attempt to quit: 12/10/1976    Years since quitting: 42.4    Smokeless tobacco: Never Used  Substance and Sexual Activity   Alcohol use: No   Drug use: No   Sexual activity: Not on file  Lifestyle   Physical activity:    Days per week: Not on file    Minutes per session: Not on file   Stress: Not on file  Relationships   Social connections:    Talks on phone: Not on file    Gets together: Not on file    Attends religious service: Not on file    Active member of club or organization: Not on file    Attends meetings of clubs or organizations: Not on file    Relationship status: Not on file   Intimate partner violence:    Fear of current or ex partner: Not on file    Emotionally abused: Not on file    Physically abused: Not on file    Forced sexual activity: Not on file  Other Topics Concern   Not on file  Social History Narrative   Not on file    Family History  Problem Relation Age of Onset   Arthritis Mother    Stroke  Maternal Grandfather    Breast cancer Neg Hx      Current Outpatient Medications:    acetaminophen (TYLENOL) 500 MG tablet, Take 1,000 mg by mouth every 6 (six) hours as needed for mild pain, moderate pain or headache., Disp: , Rfl:    allopurinol (ZYLOPRIM) 100 MG tablet, Take 200 mg by mouth 2 (two) times daily. , Disp: , Rfl:    amLODipine (NORVASC) 5 MG tablet, Take by mouth., Disp: , Rfl:    budesonide-formoterol (SYMBICORT) 160-4.5 MCG/ACT inhaler, Inhale 2 puffs into the lungs 2 (two) times daily., Disp: , Rfl:    cetirizine (ZYRTEC) 5 MG tablet, Take 5 mg by mouth daily as needed for allergies or rhinitis. , Disp: , Rfl:    citalopram (CELEXA) 10 MG tablet, Take 10 mg by mouth daily., Disp: , Rfl:    clopidogrel (PLAVIX) 75 MG tablet, Take 1 tablet (75 mg total) by mouth daily., Disp: 30 tablet, Rfl: 0   colchicine 0.6 MG tablet, Take 1 tablet (0.6 mg total) by mouth daily. (Patient taking differently: Take 1.8 mg by mouth daily as needed (gout flares). ), Disp: 30 tablet, Rfl: 0    Cyanocobalamin (VITAMIN B-12) 1000 MCG SUBL, Take by mouth., Disp: , Rfl:    dextromethorphan-guaiFENesin (MUCINEX DM) 30-600 MG 12hr tablet, Take 1 tablet by mouth 2 (two) times daily as needed for cough., Disp: , Rfl:    Dulaglutide (TRULICITY) 1.5 AO/1.3YQ SOPN, Trulicity 1.5 MV/7.8 mL subcutaneous pen injector  INJECT 1.5 MG SUBCUTANEOUSLY EVERY 7 (SEVEN) DAYS, Disp: , Rfl:    EPINEPHrine (EPI-PEN) 0.3 mg/0.3 mL SOAJ injection, Inject into the muscle as needed (anaphylaxis). , Disp: , Rfl:    fluticasone (FLONASE) 50 MCG/ACT nasal spray, Place 2 sprays into both nostrils daily. , Disp: , Rfl: 3   folic acid (V-R FOLIC ACID) 469 MCG tablet, Take 1 tablet (400 mcg total) by mouth daily., Disp: 90 tablet, Rfl: 3   furosemide (LASIX) 20 MG tablet, Take 2 tablets (40 mg total) by mouth daily., Disp: , Rfl:    Icosapent Ethyl 1 g CAPS, Take by mouth., Disp: , Rfl:    insulin aspart (NOVOLOG FLEXPEN) 100 UNIT/ML FlexPen, Inject 20-32 Units into the skin See admin instructions. 20-32 units twice daily as directed, Disp: , Rfl:    insulin glargine (LANTUS) 100 UNIT/ML injection, Inject 62 Units into the skin at bedtime. , Disp: , Rfl:    isosorbide mononitrate (IMDUR) 60 MG 24 hr tablet, Take by mouth., Disp: , Rfl:    losartan (COZAAR) 25 MG tablet, Take by mouth., Disp: , Rfl:    magnesium gluconate (MAGONATE) 500 MG tablet, Take 500 mg by mouth 2 (two) times daily. , Disp: , Rfl:    methocarbamol (ROBAXIN) 500 MG tablet, Take 500 mg by mouth 2 (two) times daily. , Disp: , Rfl: 0   metoprolol tartrate (LOPRESSOR) 50 MG tablet, Take 75 mg by mouth 2 (two) times daily. , Disp: , Rfl:    nitroGLYCERIN (NITROSTAT) 0.4 MG SL tablet, Place 0.4 mg under the tongue every 5 (five) minutes x 3 doses as needed for chest pain. , Disp: , Rfl:    Omega-3 Fatty Acids (FISH OIL) 1000 MG CAPS, Take 1 capsule by mouth daily. , Disp: , Rfl:    pantoprazole (PROTONIX) 40 MG tablet, Take 40 mg by mouth 2  (two) times daily., Disp: , Rfl:    ranolazine (RANEXA) 500 MG 12 hr tablet, Take 1 tablet (500 mg total)  by mouth 2 (two) times daily., Disp: 60 tablet, Rfl: 1   rosuvastatin (CRESTOR) 40 MG tablet, Take 40 mg by mouth every evening. , Disp: , Rfl:    Vitamin D, Ergocalciferol, (DRISDOL) 50000 units CAPS capsule, Take 50,000 Units by mouth every 30 (thirty) days. , Disp: , Rfl: 1   ipratropium-albuterol (DUONEB) 0.5-2.5 (3) MG/3ML SOLN, Take 3 mLs by nebulization every 4 (four) hours as needed. (Patient not taking: Reported on 02/05/2019), Disp: 360 mL, Rfl: 0   traMADol (ULTRAM) 50 MG tablet, Take 50 mg by mouth every 4 (four) hours as needed for moderate pain. , Disp: , Rfl:   Physical exam:  Vitals:   05/06/19 1050  BP: (!) 158/72  Pulse: 60  Resp: 18  Temp: 98.6 F (37 C)  Weight: 201 lb 4.8 oz (91.3 kg)   Physical Exam Constitutional:      General: He is not in acute distress.    Appearance: He is obese.  HENT:     Head: Normocephalic and atraumatic.  Eyes:     General: No scleral icterus.    Pupils: Pupils are equal, round, and reactive to light.  Neck:     Musculoskeletal: Normal range of motion and neck supple.  Cardiovascular:     Rate and Rhythm: Normal rate and regular rhythm.     Heart sounds: Normal heart sounds.  Pulmonary:     Effort: Pulmonary effort is normal. No respiratory distress.     Breath sounds: No wheezing.  Abdominal:     General: Bowel sounds are normal. There is no distension.     Palpations: Abdomen is soft. There is no mass.     Tenderness: There is no abdominal tenderness.  Musculoskeletal: Normal range of motion.        General: No deformity.  Skin:    General: Skin is warm and dry.     Findings: No erythema or rash.  Neurological:     Mental Status: He is alert and oriented to person, place, and time.     Cranial Nerves: No cranial nerve deficit.     Coordination: Coordination normal.  Psychiatric:        Behavior: Behavior  normal.        Thought Content: Thought content normal.     CMP Latest Ref Rng & Units 05/06/2019  Glucose 70 - 99 mg/dL 246(H)  BUN 8 - 23 mg/dL 44(H)  Creatinine 0.61 - 1.24 mg/dL 1.99(H)  Sodium 135 - 145 mmol/L 135  Potassium 3.5 - 5.1 mmol/L 4.1  Chloride 98 - 111 mmol/L 103  CO2 22 - 32 mmol/L 22  Calcium 8.9 - 10.3 mg/dL 9.0  Total Protein 6.5 - 8.1 g/dL 6.8  Total Bilirubin 0.3 - 1.2 mg/dL 0.5  Alkaline Phos 38 - 126 U/L 71  AST 15 - 41 U/L 16  ALT 0 - 44 U/L 14   CBC Latest Ref Rng & Units 05/06/2019  WBC 4.0 - 10.5 K/uL 8.0  Hemoglobin 13.0 - 17.0 g/dL 12.3(L)  Hematocrit 39.0 - 52.0 % 36.3(L)  Platelets 150 - 400 K/uL 209    Vas [place] Abi With/wo Tbi  Result Date: 05/05/2019 LOWER EXTREMITY DOPPLER STUDY Indications: Peripheral artery disease. High Risk Factors: Hypertension, diabetes.  Vascular Interventions: 2015 stent placement right lower extremity. Comparison Study: 04/29/2018 Performing Technologist: Almira Coaster RVS  Examination Guidelines: A complete evaluation includes at minimum, Doppler waveform signals and systolic blood pressure reading at the level of bilateral brachial, anterior tibial,  and posterior tibial arteries, when vessel segments are accessible. Bilateral testing is considered an integral part of a complete examination. Photoelectric Plethysmograph (PPG) waveforms and toe systolic pressure readings are included as required and additional duplex testing as needed. Limited examinations for reoccurring indications may be performed as noted.  ABI Findings: +---------+------------------+-----+---------+--------+  Right     Rt Pressure (mmHg) Index Waveform  Comment   +---------+------------------+-----+---------+--------+  Brachial  144                                          +---------+------------------+-----+---------+--------+  ATA       141                0.93  triphasic           +---------+------------------+-----+---------+--------+  PTA       156                 1.03  triphasic           +---------+------------------+-----+---------+--------+  Great Toe 127                0.84  Normal              +---------+------------------+-----+---------+--------+ +---------+------------------+-----+---------+-------+  Left      Lt Pressure (mmHg) Index Waveform  Comment  +---------+------------------+-----+---------+-------+  Brachial  152                                         +---------+------------------+-----+---------+-------+  ATA       140                0.92  triphasic          +---------+------------------+-----+---------+-------+  PTA       150                0.99  triphasic          +---------+------------------+-----+---------+-------+  Great Toe 116                0.76  Normal             +---------+------------------+-----+---------+-------+ +-------+-----------+-----------+------------+------------+  ABI/TBI Today's ABI Today's TBI Previous ABI Previous TBI  +-------+-----------+-----------+------------+------------+  Right   1.03        .84         1.04         .75           +-------+-----------+-----------+------------+------------+  Left    .99         .76         .97          .68           +-------+-----------+-----------+------------+------------+ Bilateral ABIs appear essentially unchanged compared to prior study on 04/29/2018. Bilateral TBIs appear increased compared to prior study on 04/29/2018.  Summary: Right: Resting right ankle-brachial index is within normal range. No evidence of significant right lower extremity arterial disease. The right toe-brachial index is normal. Left: Resting left ankle-brachial index is within normal range. No evidence of significant left lower extremity arterial disease. The left toe-brachial index is normal.  *See table(s) above for measurements and observations.  Electronically signed by Leotis Pain MD on 05/05/2019 at 4:28:58 PM.    Final    Vas US Carotid  Result Date: 05/05/2019 Carotid Arterial Duplex Study  Indications:       Carotid artery disease. Risk Factors:      Diabetes, coronary artery disease. Comparison Study:  04/29/2018 Performing Technologist: Almira Coaster RVS  Examination Guidelines: A complete evaluation includes B-mode imaging, spectral Doppler, color Doppler, and power Doppler as needed of all accessible portions of each vessel. Bilateral testing is considered an integral part of a complete examination. Limited examinations for reoccurring indications may be performed as noted.  Right Carotid Findings: +----------+--------+--------+--------+--------+--------+             PSV cm/s EDV cm/s Stenosis Describe Comments  +----------+--------+--------+--------+--------+--------+  CCA Prox   70       8                                    +----------+--------+--------+--------+--------+--------+  CCA Mid    58       7                                    +----------+--------+--------+--------+--------+--------+  CCA Distal 68       11                                   +----------+--------+--------+--------+--------+--------+  ICA Prox   114      21                                   +----------+--------+--------+--------+--------+--------+  ICA Mid    140      26                                   +----------+--------+--------+--------+--------+--------+  ICA Distal 63       14                                   +----------+--------+--------+--------+--------+--------+  ECA        148      0                                    +----------+--------+--------+--------+--------+--------+ +----------+--------+-------+--------+-------------------+             PSV cm/s EDV cms Describe Arm Pressure (mmHG)  +----------+--------+-------+--------+-------------------+  Subclavian 123      0                                     +----------+--------+-------+--------+-------------------+ +---------+--------+--+--------+-+  Vertebral PSV cm/s 41 EDV cm/s 8  +---------+--------+--+--------+-+  Left Carotid Findings:  +----------+--------+--------+--------+--------+--------+             PSV cm/s EDV cm/s Stenosis Describe Comments  +----------+--------+--------+--------+--------+--------+  CCA Prox   80       7                                    +----------+--------+--------+--------+--------+--------+  CCA Mid    60       12                                   +----------+--------+--------+--------+--------+--------+  CCA Distal 62       11                                   +----------+--------+--------+--------+--------+--------+  ICA Prox   68       14                                   +----------+--------+--------+--------+--------+--------+  ICA Mid    61       16                                   +----------+--------+--------+--------+--------+--------+  ICA Distal 63       17                                   +----------+--------+--------+--------+--------+--------+  ECA        115      0                                    +----------+--------+--------+--------+--------+--------+ +----------+--------+--------+--------+-------------------+  Subclavian PSV cm/s EDV cm/s Describe Arm Pressure (mmHG)  +----------+--------+--------+--------+-------------------+             150      0                                      +----------+--------+--------+--------+-------------------+  +---------+--------+--+--------+-+  Vertebral PSV cm/s 59 EDV cm/s 9  +---------+--------+--+--------+-+  Summary: Right Carotid: Velocities in the right ICA are consistent with a 40-59%                stenosis. Left Carotid: Velocities in the left ICA are consistent with a 1-39% stenosis. Vertebrals:  Bilateral vertebral arteries demonstrate antegrade flow. Subclavians: Normal flow hemodynamics were seen in bilateral subclavian              arteries. *See table(s) above for measurements and observations.  Electronically signed by Leotis Pain MD on 05/05/2019 at 4:28:47 PM.    Final      Assessment and plan  1. Iron deficiency anemia due to chronic  blood loss   2. Chronic kidney disease, unspecified CKD stage   3. B12 deficiency   4. Gynecomastia    Labs are reviewed and discussed with patient. Hemoglobin has been stable.  Hold additional IV iron treatment.   CKD, multiple myeloma panel showed no M spike,  Avoid nephrotoxin,  Vitamin B12 deficiency, negative intrinsic factor antibody and antiparital antibody.  Last vitamin B12 level is 715. Continue oral vitamin b12 supplementation.    Gynecomastia, etiology unknown. ?CKD, or drug vs obesity induced. Follow up with pcp  Orders Placed This Encounter  Procedures   Comprehensive metabolic panel    Standing Status:   Future  Standing Expiration Date:   05/05/2020   CBC with Differential/Platelet    Standing Status:   Future    Standing Expiration Date:   05/05/2020      Follow-up in 6 months.  Check CBC CMP and B12, iron TIBC ferritin  Earlie Server, MD, PhD

## 2019-05-11 ENCOUNTER — Encounter (INDEPENDENT_AMBULATORY_CARE_PROVIDER_SITE_OTHER): Payer: Self-pay | Admitting: Vascular Surgery

## 2019-06-01 DIAGNOSIS — E1159 Type 2 diabetes mellitus with other circulatory complications: Secondary | ICD-10-CM | POA: Diagnosis not present

## 2019-06-04 ENCOUNTER — Ambulatory Visit (INDEPENDENT_AMBULATORY_CARE_PROVIDER_SITE_OTHER): Payer: Medicare Other | Admitting: Internal Medicine

## 2019-06-04 ENCOUNTER — Other Ambulatory Visit: Payer: Self-pay

## 2019-06-04 ENCOUNTER — Encounter: Payer: Self-pay | Admitting: Internal Medicine

## 2019-06-04 VITALS — BP 150/70 | HR 55 | Resp 16 | Ht 66.0 in | Wt 201.0 lb

## 2019-06-04 DIAGNOSIS — I6523 Occlusion and stenosis of bilateral carotid arteries: Secondary | ICD-10-CM | POA: Diagnosis not present

## 2019-06-04 DIAGNOSIS — G4700 Insomnia, unspecified: Secondary | ICD-10-CM | POA: Diagnosis not present

## 2019-06-04 DIAGNOSIS — Z9989 Dependence on other enabling machines and devices: Secondary | ICD-10-CM

## 2019-06-04 DIAGNOSIS — J988 Other specified respiratory disorders: Secondary | ICD-10-CM

## 2019-06-04 DIAGNOSIS — I10 Essential (primary) hypertension: Secondary | ICD-10-CM

## 2019-06-04 DIAGNOSIS — R0602 Shortness of breath: Secondary | ICD-10-CM

## 2019-06-04 DIAGNOSIS — G4733 Obstructive sleep apnea (adult) (pediatric): Secondary | ICD-10-CM

## 2019-06-04 MED ORDER — TRAZODONE HCL 50 MG PO TABS: 25.0000 mg | ORAL_TABLET | Freq: Every evening | ORAL | 0 refills | Status: DC | PRN

## 2019-06-04 MED ORDER — AZITHROMYCIN 250 MG PO TABS: ORAL_TABLET | ORAL | 0 refills | Status: DC

## 2019-06-04 MED ORDER — PREDNISONE 10 MG PO TABS: ORAL_TABLET | ORAL | 0 refills | Status: DC

## 2019-06-04 NOTE — Progress Notes (Signed)
[facility] [place], [place] [address]  Pulmonary Sleep Medicine   Office Visit Note  Patient Name: Mike Decker DOB: 1941/02/24 MRN [id number]  Date of Service: 06/04/2019  Complaints/HPI: Pt is here for follow up, reporting cough, and chest congestion. He denies fever, but reports feeling "run down" like he has a lung infection.  Otherwise patient is using his cpap machine, and denies any present issues with this.  He is having trouble sleeping intermittently, and is requesting a refill on his trazodone.   ROS  General: (-) fever, (-) chills, (-) night sweats, (-) weakness Skin: (-) rashes, (-) itching,. Eyes: (-) visual changes, (-) redness, (-) itching. Nose and Sinuses: (-) nasal stuffiness or itchiness, (-) postnasal drip, (-) nosebleeds, (-) sinus trouble. Mouth and Throat: (-) sore throat, (-) hoarseness. Neck: (-) swollen glands, (-) enlarged thyroid, (-) neck pain. Respiratory: - cough, (-) bloody sputum, - shortness of breath, - wheezing. Cardiovascular: - ankle swelling, (-) chest pain. Lymphatic: (-) lymph node enlargement. Neurologic: (-) numbness, (-) tingling. Psychiatric: (-) anxiety, (-) depression   Current Medication: Outpatient Encounter Medications as of 06/04/2019  Medication Sig  . acetaminophen (TYLENOL) 500 MG tablet Take 1,000 mg by mouth every 6 (six) hours as needed for mild pain, moderate pain or headache.  . allopurinol (ZYLOPRIM) 100 MG tablet Take 200 mg by mouth 2 (two) times daily.   Marland Kitchen amLODipine (NORVASC) 5 MG tablet Take by mouth.  . budesonide-formoterol (SYMBICORT) 160-4.5 MCG/ACT inhaler Inhale 2 puffs into the lungs 2 (two) times daily.  . cetirizine (ZYRTEC) 5 MG tablet Take 5 mg by mouth daily as needed for allergies or rhinitis.   . citalopram (CELEXA) 10 MG tablet Take 10 mg by mouth daily.  . clopidogrel (PLAVIX) 75 MG tablet Take 1 tablet (75 mg total) by mouth daily.  . colchicine 0.6 MG tablet Take 1  tablet (0.6 mg total) by mouth daily. (Patient taking differently: Take 1.8 mg by mouth daily as needed (gout flares). )  . Cyanocobalamin (VITAMIN B-12) 1000 MCG SUBL Take by mouth.  . dextromethorphan-guaiFENesin (MUCINEX DM) 30-600 MG 12hr tablet Take 1 tablet by mouth 2 (two) times daily as needed for cough.  . Dulaglutide (TRULICITY) 1.5 QP/6.1PJ SOPN Trulicity 1.5 KD/3.2 mL subcutaneous pen injector  INJECT 1.5 MG SUBCUTANEOUSLY EVERY 7 (SEVEN) DAYS  . EPINEPHrine (EPI-PEN) 0.3 mg/0.3 mL SOAJ injection Inject into the muscle as needed (anaphylaxis).   . fluticasone (FLONASE) 50 MCG/ACT nasal spray Place 2 sprays into both nostrils daily.   . folic acid (V-R FOLIC ACID) 671 MCG tablet Take 1 tablet (400 mcg total) by mouth daily.  . furosemide (LASIX) 20 MG tablet Take 2 tablets (40 mg total) by mouth daily.  Vanessa Kick Ethyl 1 g CAPS Take by mouth.  . insulin aspart (NOVOLOG FLEXPEN) 100 UNIT/ML FlexPen Inject 20-32 Units into the skin See admin instructions. 20-32 units twice daily as directed  . insulin glargine (LANTUS) 100 UNIT/ML injection Inject 62 Units into the skin at bedtime.   Marland Kitchen ipratropium-albuterol (DUONEB) 0.5-2.5 (3) MG/3ML SOLN Take 3 mLs by nebulization every 4 (four) hours as needed.  . isosorbide mononitrate (IMDUR) 60 MG 24 hr tablet Take by mouth.  . losartan (COZAAR) 25 MG tablet Take by mouth.  . magnesium gluconate (MAGONATE) 500 MG tablet Take 500 mg by mouth 2 (two) times daily.   . methocarbamol (ROBAXIN) 500 MG tablet Take 500 mg by mouth 2 (two) times daily.   Marland Kitchen  metoprolol tartrate (LOPRESSOR) 50 MG tablet Take 75 mg by mouth 2 (two) times daily.   . nitroGLYCERIN (NITROSTAT) 0.4 MG SL tablet Place 0.4 mg under the tongue every 5 (five) minutes x 3 doses as needed for chest pain.   . Omega-3 Fatty Acids (FISH OIL) 1000 MG CAPS Take 1 capsule by mouth daily.   . pantoprazole (PROTONIX) 40 MG tablet Take 40 mg by mouth 2 (two) times daily.  . ranolazine (RANEXA)  500 MG 12 hr tablet Take 1 tablet (500 mg total) by mouth 2 (two) times daily.  . rosuvastatin (CRESTOR) 40 MG tablet Take 40 mg by mouth every evening.   . traMADol (ULTRAM) 50 MG tablet Take 50 mg by mouth every 4 (four) hours as needed for moderate pain.   . Vitamin D, Ergocalciferol, (DRISDOL) 50000 units CAPS capsule Take 50,000 Units by mouth every 30 (thirty) days.    No facility-administered encounter medications on file as of 06/04/2019.     Surgical History: Past Surgical History:  Procedure Laterality Date  . CATARACT EXTRACTION  2011, 2012  . COLONOSCOPY N/A 12/14/2018   Procedure: COLONOSCOPY;  Surgeon: Virgel Manifold, MD;  Location: [facility] ENDOSCOPY;  Service: Endoscopy;  Laterality: N/A;  . CORONARY ANGIOPLASTY WITH STENT PLACEMENT  2012  . ESOPHAGOGASTRODUODENOSCOPY N/A 12/14/2018   Procedure: ESOPHAGOGASTRODUODENOSCOPY (EGD);  Surgeon: Virgel Manifold, MD;  Location[facility] ENDOSCOPY;  Service: Endoscopy;  Laterality: N/A;  . ESOPHAGOGASTRODUODENOSCOPY (EGD) WITH PROPOFOL N/A 06/01/2016   Procedure: ESOPHAGOGASTRODUODENOSCOPY (EGD) WITH PROPOFOL;  Surgeon: Manya Silvas, MD;  Location: [facility] ENDOSCOPY;  Service: Endoscopy;  Laterality: N/A;  . HERNIA REPAIR     x2  . INTRAOCULAR LENS INSERTION    . LEFT HEART CATH AND CORONARY ANGIOGRAPHY N/A 05/09/2017   Procedure: Left Heart Cath and Coronary Angiography;  Surgeon: Yolonda Kida, MD;  Location: [facility] CV LAB;  Service: Cardiovascular;  Laterality: N/A;  . LEFT HEART CATH AND CORONARY ANGIOGRAPHY N/A 12/08/2018   Procedure: LEFT HEART CATH AND CORONARY ANGIOGRAPHY and possible PCI and stent;  Surgeon: Yolonda Kida, MD;  Location: [facility] CV LAB;  Service: Cardiovascular;  Laterality: N/A;  . NOSE SURGERY     submucous resection  . PROSTATE SURGERY  2002  . ROTATOR CUFF REPAIR Right     Medical History: Past Medical History:  Diagnosis Date  . Anemia   . Arthritis   . 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 Barrett esophagus   . 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 Chronic diastolic CHF (congestive heart failure) ([place])   . Colon polyp   . Diabetes mellitus without complication ([place])   . Diverticulosis   . Gout   . Heart disease   . Hemangioma    liver  . Hyperlipidemia   . Hypertension   . Myocardial infarct ([place])   . Ocular hypertension   . Peripheral vascular disease ([place])   . Skin cancer   . Sleep apnea   . Vitreoretinal degeneration     Family History: Family History  Problem Relation Age of Onset  . Arthritis Mother   . Stroke Maternal Grandfather   . Breast cancer Neg Hx     Social History: Social History   Socioeconomic History  . Marital status: Married    Spouse name: Not on file  . Number of children: Not on file  . Years of education: Not on file  . Highest education level:  Not on file  Occupational History  . Not on file  Social Needs  . Financial resource strain: Not on file  . Food insecurity    Worry: Not on file    Inability: Not on file  . Transportation needs    Medical: Not on file    Non-medical: Not on file  Tobacco Use  . Smoking status: Former Smoker    Years: 20.00    Types: Cigarettes    Quit date: 12/10/1976    Years since quitting: 42.5  . Smokeless tobacco: Never Used  Substance and Sexual Activity  . Alcohol use: No  . Drug use: No  . Sexual activity: Not on file  Lifestyle  . Physical activity    Days per week: Not on file    Minutes per session: Not on file  . Stress: Not on file  Relationships  . Social Herbalist on phone: Not on file    Gets together: Not on file    Attends religious service: Not on file    Active member of club or organization: Not on file    Attends meetings of clubs or organizations: Not on file    Relationship status: Not on file  . Intimate partner violence    Fear of current or ex partner: Not on file    Emotionally abused: Not on file     Physically abused: Not on file    Forced sexual activity: Not on file  Other Topics Concern  . Not on file  Social History Narrative  . Not on file    Vital Signs: Blood pressure (!) 150/70, pulse (!) 55, resp. rate 16, height 5\' 6"  (1.676 m), weight 201 lb (91.2 kg), SpO2 97 %.  Examination: General Appearance: The patient is well-developed, well-nourished, and in no distress. Skin: Gross inspection of skin unremarkable. Head: normocephalic, no gross deformities. Eyes: no gross deformities noted. ENT: ears appear grossly normal no exudates. Neck: Supple. No thyromegaly. No LAD. Respiratory: clear bilateraly. Cardiovascular: Normal S1 and S2 without murmur or rub. Extremities: No cyanosis. pulses are equal. Neurologic: Alert and oriented. No involuntary movements.  LABS: Recent Results (from the past 2160 hour(s))  Vitamin B12     Status: None   Collection Time: 05/06/19 10:05 AM  Result Value Ref Range   Vitamin B-12 715 180 - 914 pg/mL    Comment: (NOTE) This assay is not validated for testing neonatal or myeloproliferative syndrome specimens for Vitamin B12 levels. Performed at [facility], [place] [address], [place], [place] [address]   Comprehensive metabolic panel     Status: Abnormal   Collection Time: 05/06/19 10:05 AM  Result Value Ref Range   Sodium 135 135 - 145 mmol/L   Potassium 4.1 3.5 - 5.1 mmol/L   Chloride 103 98 - 111 mmol/L   CO2 22 22 - 32 mmol/L   Glucose, Bld 246 (H) 70 - 99 mg/dL   BUN 44 (H) 8 - 23 mg/dL   Creatinine, Ser 1.99 (H) 0.61 - 1.24 mg/dL   Calcium 9.0 8.9 - 10.3 mg/dL   Total Protein 6.8 6.5 - 8.1 g/dL   Albumin 4.2 3.5 - 5.0 g/dL   AST 16 15 - 41 U/L   ALT 14 0 - 44 U/L   Alkaline Phosphatase 71 38 - 126 U/L   Total Bilirubin 0.5 0.3 - 1.2 mg/dL   GFR calc non Af Amer 31 (L) >60 mL/min   GFR calc Af  Amer 36 (L) >60 mL/min   Anion gap 10 5 - 15    Comment: Performed at [facility], [address]  [place], [place] [address]  Ferritin     Status: None   Collection Time: 05/06/19 10:05 AM  Result Value Ref Range   Ferritin 87 24 - 336 ng/mL    Comment: Performed at [facility], [address] [place], [place] [address]  CBC with Differential/Platelet     Status: Abnormal   Collection Time: 05/06/19 10:05 AM  Result Value Ref Range   WBC 8.0 4.0 - 10.5 K/uL   RBC 3.91 (L) 4.22 - 5.81 MIL/uL   Hemoglobin 12.3 (L) 13.0 - 17.0 g/dL   HCT 36.3 (L) 39.0 - 52.0 %   MCV 92.8 80.0 - 100.0 fL   MCH 31.5 26.0 - 34.0 pg   MCHC 33.9 30.0 - 36.0 g/dL   RDW 14.2 11.5 - 15.5 %   Platelets 209 150 - 400 K/uL   nRBC 0.0 0.0 - 0.2 %   Neutrophils Relative % 63 %   Neutro Abs 5.0 1.7 - 7.7 K/uL   Lymphocytes Relative 16 %   Lymphs Abs 1.3 0.7 - 4.0 K/uL   Monocytes Relative 10 %   Monocytes Absolute 0.8 0.1 - 1.0 K/uL   Eosinophils Relative 9 %   Eosinophils Absolute 0.7 (H) 0.0 - 0.5 K/uL   Basophils Relative 1 %   Basophils Absolute 0.1 0.0 - 0.1 K/uL   Immature Granulocytes 1 %   Abs Immature Granulocytes 0.09 (H) 0.00 - 0.07 K/uL    Comment: Performed at [facility], [address] [place], [place] [address]  Iron and TIBC     Status: None   Collection Time: 05/06/19 10:05 AM  Result Value Ref Range   Iron 72 45 - 182 ug/dL   TIBC 321 250 - 450 ug/dL   Saturation Ratios 22 17.9 - 39.5 %   UIBC 249 ug/dL    Comment: Performed at [facility], [address], [place], [place] [address]    Radiology: Mm Diag Breast Tomo Bilateral  Result Date: 03/23/2019 CLINICAL DATA:  Tenderness under the right nipple. EXAM: DIGITAL DIAGNOSTIC BILATERAL MAMMOGRAM WITH CAD AND TOMO COMPARISON:  Previous exam(s). ACR Breast Density Category b: There are scattered areas of fibroglandular density. FINDINGS: The patient is palpating gynecomastia on the right which is asymmetric to the left. Minimal gynecomastia on the left. No evidence of malignancy in either breast. Mammographic  images were processed with CAD. IMPRESSION: Right greater than left gynecomastia, accounting for the patient's symptoms. RECOMMENDATION: Treatment of the patient's gynecomastia should be based on clinical and physical exam. I have discussed the findings and recommendations with the patient. Results were also provided in writing at the conclusion of the visit. If applicable, a reminder letter will be sent to the patient regarding the next appointment. BI-RADS CATEGORY  2: Benign. Electronically Signed   By: Dorise Bullion III M.D   On: 03/23/2019 13:49    No results found.  No results found.    Assessment and Plan: Patient Active Problem List   Diagnosis Date Noted  . DM type 2 with diabetic peripheral neuropathy ([place]) 01/25/2019  . CKD (chronic kidney disease) stage 4, GFR 15-29 ml/min ([facility]) 01/21/2019  . Gout 01/21/2019  . MR (mitral regurgitation) 01/21/2019  . COPD (chronic obstructive pulmonary disease) ([place]) 01/08/2019  . OSA (obstructive sleep apnea) 01/08/2019  . Chronic diastolic heart failure (  [place]) 12/30/2018  . Polyp of sigmoid colon   . Benign neoplasm of ascending colon   . Hemorrhoids without complication   . Diverticulosis of large intestine without diverticulitis   . Reflux esophagitis   . Lymphangiectasia   . Arteriovenous malformation of duodenum   . Symptomatic anemia   . SOB (shortness of breath)   . Iron deficiency anemia due to chronic blood loss   . Unstable angina ([place])   . Anemia of chronic renal failure, stage 4 (severe) ([place])   . Chest pain 11/14/2018  . Sciatica 11/12/2018  . CAD (coronary artery disease) 02/27/2018  . Sacroiliac joint pain 10/08/2017  . Pain in limb 09/24/2017  . Neuropathy 07/24/2017  . NSTEMI (non-ST elevated myocardial infarction) ([place]) 05/06/2017  . PAD (peripheral artery disease) ([place]) 05/01/2017  . History of stroke 01/02/2017  . Barrett's esophagus 10/02/2016  . Carotid stenosis 10/02/2016  . Essential hypertension  10/02/2016  . Hyperlipidemia 10/02/2016  . Diabetes ([place]) 10/02/2016  . 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 Uncontrolled type 2 diabetes mellitus with hyperglycemia, with long-term current use of insulin ([place]) 04/10/2016  . TIA (transient ischemic attack) 10/08/2015   1. Respiratory infection Advised patient to take entire course of antibiotics as prescribed with food. Pt should return to clinic in 7-10 days if symptoms fail to improve or new symptoms develop.  - azithromycin (ZITHROMAX) 250 MG tablet; Take as directed  Dispense: 6 tablet; Refill: 0 - predniSONE (DELTASONE) 10 MG tablet; Use per dose pack  Dispense: 21 tablet; Refill: 0  2. OSA on CPAP Continue to use CPAP as discussed.   3. Essential hypertension Slightly elevated, Continue medications as prescribed.  Contact pcp for follow up if bp remains elevated.   4. Insomnia, unspecified type Refilled patients trazadone for sleep aid at this time.  - traZODone (DESYREL) 50 MG tablet; Take 0.5 tablets (25 mg total) by mouth at bedtime as needed for sleep.  Dispense: 15 tablet; Refill: 0  5. SOB (shortness of breath) - Spirometry with Graph   General Counseling: I have discussed the findings of the evaluation and examination with Marland Kitchen.  I have also discussed any further diagnostic evaluation thatmay be needed or ordered today. Clever verbalizes understanding of the findings of todays visit. We also reviewed his medications today and discussed drug interactions and side effects including but not limited excessive drowsiness and altered mental states. We also discussed that there is always a risk not just to him but also people around him. he has been encouraged to call the office with any questions or concerns that should arise related to todays visit.    Time spent: 20  I have personally obtained a history, examined the patient, evaluated laboratory and imaging results, formulated the assessment  and plan and placed orders.    Allyne Gee, MD [facility] Pulmonary and Critical Care Sleep medicine

## 2019-06-08 ENCOUNTER — Other Ambulatory Visit: Payer: Self-pay

## 2019-06-08 DIAGNOSIS — E1122 Type 2 diabetes mellitus with diabetic chronic kidney disease: Secondary | ICD-10-CM | POA: Diagnosis not present

## 2019-06-08 DIAGNOSIS — E1142 Type 2 diabetes mellitus with diabetic polyneuropathy: Secondary | ICD-10-CM | POA: Diagnosis not present

## 2019-06-08 DIAGNOSIS — G4700 Insomnia, unspecified: Secondary | ICD-10-CM

## 2019-06-08 DIAGNOSIS — R809 Proteinuria, unspecified: Secondary | ICD-10-CM | POA: Diagnosis not present

## 2019-06-08 DIAGNOSIS — N183 Chronic kidney disease, stage 3 (moderate): Secondary | ICD-10-CM | POA: Diagnosis not present

## 2019-06-08 DIAGNOSIS — E1159 Type 2 diabetes mellitus with other circulatory complications: Secondary | ICD-10-CM | POA: Diagnosis not present

## 2019-06-08 DIAGNOSIS — E1129 Type 2 diabetes mellitus with other diabetic kidney complication: Secondary | ICD-10-CM | POA: Diagnosis not present

## 2019-06-08 DIAGNOSIS — Z794 Long term (current) use of insulin: Secondary | ICD-10-CM | POA: Diagnosis not present

## 2019-06-08 DIAGNOSIS — E1165 Type 2 diabetes mellitus with hyperglycemia: Secondary | ICD-10-CM | POA: Diagnosis not present

## 2019-06-08 MED ORDER — TRAZODONE HCL 50 MG PO TABS: 25.0000 mg | ORAL_TABLET | Freq: Every evening | ORAL | 0 refills | Status: DC | PRN

## 2019-06-10 ENCOUNTER — Emergency Department: Payer: Medicare Other

## 2019-06-10 ENCOUNTER — Emergency Department
Admission: EM | Admit: 2019-06-10 | Discharge: 2019-06-10 | Disposition: A | Discharge status: Home / Self Care | Payer: Medicare Other | Attending: Emergency Medicine | Admitting: Emergency Medicine

## 2019-06-10 ENCOUNTER — Other Ambulatory Visit: Payer: Self-pay

## 2019-06-10 DIAGNOSIS — N184 Chronic kidney disease, stage 4 (severe): Secondary | ICD-10-CM | POA: Insufficient documentation

## 2019-06-10 DIAGNOSIS — Z87891 Personal history of nicotine dependence: Secondary | ICD-10-CM | POA: Diagnosis not present

## 2019-06-10 DIAGNOSIS — R079 Chest pain, unspecified: Secondary | ICD-10-CM | POA: Insufficient documentation

## 2019-06-10 DIAGNOSIS — Z8546 Personal history of malignant neoplasm of prostate: Secondary | ICD-10-CM | POA: Insufficient documentation

## 2019-06-10 DIAGNOSIS — R202 Paresthesia of skin: Secondary | ICD-10-CM | POA: Diagnosis not present

## 2019-06-10 DIAGNOSIS — I2510 Atherosclerotic heart disease of native coronary artery without angina pectoris: Secondary | ICD-10-CM | POA: Insufficient documentation

## 2019-06-10 DIAGNOSIS — I130 Hypertensive heart and chronic kidney disease with heart failure and stage 1 through stage 4 chronic kidney disease, or unspecified chronic kidney disease: Secondary | ICD-10-CM | POA: Diagnosis not present

## 2019-06-10 DIAGNOSIS — Z79899 Other long term (current) drug therapy: Secondary | ICD-10-CM | POA: Diagnosis not present

## 2019-06-10 DIAGNOSIS — Z7902 Long term (current) use of antithrombotics/antiplatelets: Secondary | ICD-10-CM | POA: Insufficient documentation

## 2019-06-10 DIAGNOSIS — E1122 Type 2 diabetes mellitus with diabetic chronic kidney disease: Secondary | ICD-10-CM | POA: Diagnosis not present

## 2019-06-10 DIAGNOSIS — Z794 Long term (current) use of insulin: Secondary | ICD-10-CM | POA: Diagnosis not present

## 2019-06-10 DIAGNOSIS — J449 Chronic obstructive pulmonary disease, unspecified: Secondary | ICD-10-CM | POA: Insufficient documentation

## 2019-06-10 DIAGNOSIS — Z91010 Allergy to peanuts: Secondary | ICD-10-CM | POA: Insufficient documentation

## 2019-06-10 DIAGNOSIS — E1165 Type 2 diabetes mellitus with hyperglycemia: Secondary | ICD-10-CM | POA: Diagnosis not present

## 2019-06-10 DIAGNOSIS — I5032 Chronic diastolic (congestive) heart failure: Secondary | ICD-10-CM | POA: Insufficient documentation

## 2019-06-10 DIAGNOSIS — I10 Essential (primary) hypertension: Secondary | ICD-10-CM | POA: Diagnosis not present

## 2019-06-10 LAB — BASIC METABOLIC PANEL
Anion gap: 13 (ref 5–15)
BUN: 55 mg/dL — ABNORMAL HIGH (ref 8–23)
CO2: 20 mmol/L — ABNORMAL LOW (ref 22–32)
Calcium: 9.4 mg/dL (ref 8.9–10.3)
Chloride: 98 mmol/L (ref 98–111)
Creatinine, Ser: 2.19 mg/dL — ABNORMAL HIGH (ref 0.61–1.24)
GFR calc Af Amer: 32 mL/min — ABNORMAL LOW (ref >60)
GFR calc non Af Amer: 28 mL/min — ABNORMAL LOW (ref >60)
Glucose, Bld: 354 mg/dL — ABNORMAL HIGH (ref 70–99)
Potassium: 5.5 mmol/L — ABNORMAL HIGH (ref 3.5–5.1)
Sodium: 131 mmol/L — ABNORMAL LOW (ref 135–145)

## 2019-06-10 LAB — TROPONIN I (HIGH SENSITIVITY)
Troponin I (High Sensitivity): 10 ng/L (ref <18)
Troponin I (High Sensitivity): 16 ng/L (ref <18)

## 2019-06-10 LAB — CBC
HCT: 39.6 % (ref 39.0–52.0)
Hemoglobin: 13.2 g/dL (ref 13.0–17.0)
MCH: 31.1 pg (ref 26.0–34.0)
MCHC: 33.3 g/dL (ref 30.0–36.0)
MCV: 93.4 fL (ref 80.0–100.0)
Platelets: 261 10*3/uL (ref 150–400)
RBC: 4.24 MIL/uL (ref 4.22–5.81)
RDW: 13.5 % (ref 11.5–15.5)
WBC: 14.2 10*3/uL — ABNORMAL HIGH (ref 4.0–10.5)
nRBC: 0 % (ref 0.0–0.2)

## 2019-06-10 NOTE — ED Triage Notes (Signed)
pt arrives via ems from home c/o center chest pain, radiating into left arm with numbess and jaw tingling.   3 SL nitro, 324 asprine. put in prednisone for sinus infection. hx of CP with steroid use. aslo taking zpak for sinus infection  3/10 CP at this time   174/70 BP 98% 78 HR 252 CBG 98.7- T  Pt a&o x 4, speaking in full sentences, NAD noted at this time. Denies N/V/D, states felt a little lightheaded, and SOB initially but has subsided. Pt contributes SOB to anxiety.   wife contact info [phone]

## 2019-06-10 NOTE — Discharge Instructions (Addendum)
Follow-up with Dr. Clayborn Bigness on Monday as scheduled.  Discontinue the prednisone.  Continue to take your other medications as prescribed.  Return to the ER immediately for new, worsening, or persistent severe chest pain, difficulty breathing, weakness, nausea, or any other new or worsening symptoms that concern you.

## 2019-06-10 NOTE — ED Provider Notes (Signed)
[facility] Emergency Department Provider Note ____________________________________________   First MD Initiated Contact with Patient 06/10/19 [phone]     (approximate)  I have reviewed the triage vital signs and the nursing notes.   HISTORY  Chief Complaint Chest Pain    HPI Mike Decker is a 78 y.o. male with PMH as noted below including a history of CAD presents with chest pain, acute onset this morning, described as sharp pain and now mostly resolved after about 1 hour.  He had some associated shortness of breath but no nausea or lightheadedness.  He was not exerting himself at the time.  He now just feels a mild squeezing.  He states he has been having a sinus infection and was started on prednisone; he took the first dose yesterday.  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, esphageal   Chronic diastolic CHF (congestive heart failure) (HCC)    Colon polyp    Diabetes mellitus without complication ([place])    Diverticulosis    Gout    Heart disease    Hemangioma    liver   Hyperlipidemia    Hypertension    Myocardial infarct [facility])    Ocular hypertension    Peripheral vascular disease ([place])    Skin cancer    Sleep apnea    Vitreoretinal degeneration     Patient Active Problem List   Diagnosis Date Noted   DM type 2 with diabetic peripheral neuropathy ([place]) 01/25/2019   CKD (chronic kidney disease) stage 4, GFR 15-29 ml/min ([place]) 01/21/2019   Gout 01/21/2019   MR (mitral regurgitation) 01/21/2019   COPD (chronic obstructive pulmonary disease) ([place]) 01/08/2019   OSA (obstructive sleep apnea) 01/08/2019   Chronic diastolic heart failure ([place]) 12/30/2018   Polyp of sigmoid colon    Benign neoplasm of ascending colon    Hemorrhoids without complication    Diverticulosis of large  intestine without diverticulitis    Reflux esophagitis    Lymphangiectasia    Arteriovenous malformation of duodenum    Symptomatic anemia    SOB (shortness of breath)    Iron deficiency anemia due to chronic blood loss    Unstable angina (HCC)    Anemia of chronic renal failure, stage 4 (severe) ([place])    Chest pain 11/14/2018   Sciatica 11/12/2018   CAD (coronary artery disease) 02/27/2018   Sacroiliac joint pain 10/08/2017   Pain in limb 09/24/2017   Neuropathy 07/24/2017   NSTEMI (non-ST elevated myocardial infarction) ([place]) 05/06/2017   PAD (peripheral artery disease) ([place]) 05/01/2017   History of stroke 01/02/2017   Barrett's esophagus 10/02/2016   Carotid stenosis 10/02/2016   Essential hypertension 10/02/2016   Hyperlipidemia 10/02/2016   Diabetes ([place]) 10/02/2016   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/24/2016   Uncontrolled type 2 diabetes mellitus with hyperglycemia, with long-term current use of insulin ([place]) 04/10/2016   TIA (transient ischemic attack) 10/08/2015    Past Surgical History:  Procedure Laterality Date   CATARACT EXTRACTION  2011, 2012   COLONOSCOPY N/A 12/14/2018   Procedure: COLONOSCOPY;  Surgeon: Virgel Manifold, MD;  Location: [facility] ENDOSCOPY;  Service: Endoscopy;  Laterality: N/A;   CORONARY ANGIOPLASTY WITH STENT PLACEMENT  2012   ESOPHAGOGASTRODUODENOSCOPY N/A 12/14/2018   Procedure: ESOPHAGOGASTRODUODENOSCOPY (EGD);  Surgeon: Virgel Manifold,  MD;  Location: [facility] ENDOSCOPY;  Service: Endoscopy;  Laterality: N/A;   ESOPHAGOGASTRODUODENOSCOPY (EGD) WITH PROPOFOL N/A 06/01/2016   Procedure: ESOPHAGOGASTRODUODENOSCOPY (EGD) WITH PROPOFOL;  Surgeon: Manya Silvas, MD;  Location: [facility] ENDOSCOPY;  Service: Endoscopy;  Laterality: N/A;   HERNIA REPAIR     x2   INTRAOCULAR LENS INSERTION     LEFT HEART CATH AND CORONARY ANGIOGRAPHY N/A 05/09/2017   Procedure: Left Heart Cath and Coronary  Angiography;  Surgeon: Yolonda Kida, MD;  Location: [place] CV LAB;  Service: Cardiovascular;  Laterality: N/A;   LEFT HEART CATH AND CORONARY ANGIOGRAPHY N/A 12/08/2018   Procedure: LEFT HEART CATH AND CORONARY ANGIOGRAPHY and possible PCI and stent;  Surgeon: Yolonda Kida, MD;  Location: [place] CV LAB;  Service: Cardiovascular;  Laterality: N/A;   NOSE SURGERY     submucous resection   PROSTATE SURGERY  2002   ROTATOR CUFF REPAIR Right     Prior to Admission medications   Medication Sig Start Date End Date Taking? Authorizing Provider  acetaminophen (TYLENOL) 500 MG tablet Take 1,000 mg by mouth every 6 (six) hours as needed for mild pain, moderate pain or headache.    [provider]  allopurinol (ZYLOPRIM) 100 MG tablet Take 200 mg by mouth 2 (two) times daily.     [provider]  amLODipine (NORVASC) 5 MG tablet Take by mouth. 12/31/18   [provider]  azithromycin (ZITHROMAX) 250 MG tablet Take as directed 06/04/19   Kendell Bane, NP  budesonide-formoterol (SYMBICORT) 160-4.5 MCG/ACT inhaler Inhale 2 puffs into the lungs 2 (two) times daily.    [provider]  cetirizine (ZYRTEC) 5 MG tablet Take 5 mg by mouth daily as needed for allergies or rhinitis.     [provider]  citalopram (CELEXA) 10 MG tablet Take 10 mg by mouth daily.    [provider]  clopidogrel (PLAVIX) 75 MG tablet Take 1 tablet (75 mg total) by mouth daily. 10/09/15   Bettey Costa, MD  colchicine 0.6 MG tablet Take 1 tablet (0.6 mg total) by mouth daily. Patient taking differently: Take 1.8 mg by mouth daily as needed (gout flares).  11/17/18   Bettey Costa, MD  Cyanocobalamin (VITAMIN B-12) 1000 MCG SUBL Take by mouth.    [provider]  dextromethorphan-guaiFENesin (MUCINEX DM) 30-600 MG 12hr tablet Take 1 tablet by mouth 2 (two) times daily as needed for cough.    [provider]  Dulaglutide (TRULICITY) 1.5  BD/5.3GD SOPN Trulicity 1.5 JM/4.2 mL subcutaneous pen injector  INJECT 1.5 MG SUBCUTANEOUSLY EVERY 7 (SEVEN) DAYS 01/21/19   [provider]  EPINEPHrine (EPI-PEN) 0.3 mg/0.3 mL SOAJ injection Inject into the muscle as needed (anaphylaxis).     [provider]  fluticasone (FLONASE) 50 MCG/ACT nasal spray Place 2 sprays into both nostrils daily.  11/04/18   [provider]  folic acid (V-R FOLIC ACID) 683 MCG tablet Take 1 tablet (400 mcg total) by mouth daily. 12/30/18   Earlie Server, MD  furosemide (LASIX) 20 MG tablet Take 2 tablets (40 mg total) by mouth daily. 11/17/18   Bettey Costa, MD  Icosapent Ethyl 1 g CAPS Take by mouth. 01/27/19   [provider]  insulin aspart (NOVOLOG FLEXPEN) 100 UNIT/ML FlexPen Inject 20-32 Units into the skin See admin instructions. 20-32 units twice daily as directed    [provider]  insulin glargine (LANTUS) 100 UNIT/ML injection Inject 62 Units into the skin at  bedtime.     [provider]  ipratropium-albuterol (DUONEB) 0.5-2.5 (3) MG/3ML SOLN Take 3 mLs by nebulization every 4 (four) hours as needed. 05/15/17   Hillary Bow, MD  isosorbide mononitrate (IMDUR) 60 MG 24 hr tablet Take by mouth. 01/28/19   [provider]  losartan (COZAAR) 25 MG tablet Take by mouth. 01/19/19   [provider]  magnesium gluconate (MAGONATE) 500 MG tablet Take 500 mg by mouth 2 (two) times daily.     [provider]  methocarbamol (ROBAXIN) 500 MG tablet Take 500 mg by mouth 2 (two) times daily.  11/08/18   [provider]  metoprolol tartrate (LOPRESSOR) 50 MG tablet Take 75 mg by mouth 2 (two) times daily.     [provider]  nitroGLYCERIN (NITROSTAT) 0.4 MG SL tablet Place 0.4 mg under the tongue every 5 (five) minutes x 3 doses as needed for chest pain.     [provider]  Omega-3 Fatty Acids (FISH OIL) 1000 MG CAPS Take 1 capsule by mouth daily.     [provider]  pantoprazole (PROTONIX) 40 MG tablet Take 40 mg by mouth 2 (two) times daily.    [provider]  predniSONE (DELTASONE) 10 MG tablet Use per dose pack 06/04/19   [place]ar[place], Audie Clear, NP  ranolazine (RANEXA) 500 MG 12 hr tablet Take 1 tablet (500 mg total) by mouth 2 (two) times daily. 12/15/18   Demetrios Loll, MD  rosuvastatin (CRESTOR) 40 MG tablet Take 40 mg by mouth every evening.     [provider]  traZODone (DESYREL) 50 MG tablet Take 0.5 tablets (25 mg total) by mouth at bedtime as needed for sleep. 06/08/19   Kendell Bane, NP  Vitamin D, Ergocalciferol, (DRISDOL) 50000 units CAPS capsule Take 50,000 Units by mouth every 30 (thirty) days.     [provider]    Allergies Gabapentin, Peanut-containing drug products, Penicillins, Bee venom, Influenza vaccines, Inh [isoniazid], Kenalog [triamcinolone acetonide], Levaquin [levofloxacin], Nalfon [fenoprofen calcium], Naproxen, Peanut oil, and Nsaids  Family History  Problem Relation Age of Onset   Arthritis Mother    Stroke Maternal Grandfather    Breast cancer Neg Hx     Social History Social History   Tobacco Use   Smoking status: Former Smoker    Years: 20.00    Types: Cigarettes    Quit date: 12/10/1976    Years since quitting: 42.5   Smokeless tobacco: Never Used  Substance Use Topics   Alcohol use: No   Drug use: No    Review of Systems  Constitutional: No fever. Eyes: No redness. ENT: No neck pain. Cardiovascular: Positive for chest pain. Respiratory: Denies shortness of breath. Gastrointestinal: No vomiting or diarrhea.  Genitourinary: Negative for flank pain.  Musculoskeletal: Negative for back pain. Skin: Negative for rash. Neurological: Negative for headache.   ____________________________________________   PHYSICAL EXAM:  VITAL SIGNS: ED Triage Vitals  Enc Vitals Group     BP 06/10/19 0727 (!) 192/71     Pulse Rate 06/10/19 0727 77     Resp 06/10/19 0727 18      Temp 06/10/19 0727 98 F (36.7 C)     Temp Source 06/10/19 0727 Oral     SpO2 06/10/19 0727 97 %     Weight 06/10/19 0728 201 lb (91.2 kg)     Height 06/10/19 0728 5\' 6"  (1.676 m)     Head Circumference --      Peak Flow --  Pain Score 06/10/19 0728 3     Pain Loc --      Pain Edu? --      Excl. in [place]? --     Constitutional: Alert and oriented. Well appearing and in no acute distress. Eyes: Conjunctivae are normal.  Head: Atraumatic. Nose: No congestion/rhinnorhea. Mouth/Throat: Mucous membranes are moist.   Neck: Normal range of motion.  Cardiovascular: Normal rate, regular rhythm.Good peripheral circulation. Respiratory: Normal respiratory effort.  No retractions.  Gastrointestinal:  No distention.  Musculoskeletal: No lower extremity edema.  No calf or popliteal swelling or tenderness.  Extremities warm and well perfused.  Neurologic:  Normal speech and language. No gross focal neurologic deficits are appreciated.  Skin:  Skin is warm and dry. No rash noted. Psychiatric: Mood and affect are normal. Speech and behavior are normal.  ____________________________________________   LABS (all labs ordered are listed, but only abnormal results are displayed)  Labs Reviewed  BASIC METABOLIC PANEL - Abnormal; Notable for the following components:      Result Value   Sodium 131 (*)    Potassium 5.5 (*)    CO2 20 (*)    Glucose, Bld 354 (*)    BUN 55 (*)    Creatinine, Ser 2.19 (*)    GFR calc non Af Amer 28 (*)    GFR calc Af Amer 32 (*)    All other components within normal limits  CBC - Abnormal; Notable for the following components:   WBC 14.2 (*)    All other components within normal limits  TROPONIN I (HIGH SENSITIVITY)  TROPONIN I (HIGH SENSITIVITY)   ____________________________________________  EKG  ED ECG REPORT I, Arta Silence, the attending physician, personally viewed and interpreted this ECG.  Date: 06/10/2019 EKG Time: 0721 Rate:  79 Rhythm: normal sinus rhythm QRS Axis: normal Intervals: nonspecific IVCD ST/T Wave abnormalities: normal Narrative Interpretation: no evidence of acute ischemia ____________________________________________  RADIOLOGY  CXR: No focal infiltrate  ____________________________________________   PROCEDURES  Procedure(s) performed: No  Procedures  Critical Care performed: No ____________________________________________   INITIAL IMPRESSION / ASSESSMENT AND PLAN / ED COURSE  Pertinent labs & imaging results that were available during my care of the patient were reviewed by me and considered in my medical decision making (see chart for details).  78 year old male with a history of CAD and other PMH as noted above presents with nonexertional chest pain this morning which has now mostly subsided.  He was in his usual state of health except for a sinus infection, and started prednisone yesterday.  On exam, the patient is overall well-appearing.  He is hypertensive with otherwise normal vital signs.  He has not yet taken his morning medications.  The physical exam is otherwise unremarkable.  EKG is nonischemic.  I reviewed the past medical records in [place].  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  The patient follows with Dr. Clayborn Bigness for cardiology and is being medically managed for his CAD.  He was evaluated at [facility] in March of this year, and this is a summary of his cardiac history at that time:  admitted to hospital from 12/27-1/6 for unstable angina, found to have Hg drop from 12 to 7. 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 LHC 12/09/2018 showed prox-mid LAD 50%, dLAD 75, Ost Lcx 75, RCA 40% w/ patent stent, ost OM1 50%. Similar  to [facility] on 04/2017. Treated medically, but thought that chest pain related to microvascular  disease. Cannot find adenosine/CFR testing or FFR. His Imdur was decreased, started on Ranexa and discharged  Given the patient's history, the presentation is concerning for ACS, however I am reassured by his well appearance, normal EKG, and spontaneously resolved pain.  It is possible that the symptoms could be related to the prednisone which he took last night for the first time.  We will obtain basic labs, chest x-ray, cardiac enzymes, and reassess.  ----------------------------------------- 12:36 PM on 06/10/2019 -----------------------------------------  First and second troponin are both negative.  Lab work-up is otherwise unremarkable except for elevated WBC count and glucose consistent with the patient having taken prednisone.  His creatinine is essentially unchanged from baseline.  I consulted Dr. Saralyn Pilar from cardiology and reviewed the patient's clinical presentation and the lab work-up with him.  He advises that given that the patient is essentially pain-free at this time and has no significant rise in troponin, he would be stable for outpatient work-up.  The patient has follow-up arranged with Dr. Clayborn Bigness in a few days.  On reassessment, the patient continues to be comfortable with no significant active chest pain.  His blood pressure has improved in the ED on its own.  He feels well to go home.  I counseled him on the results of the work-up.  Return precautions given, and he expresses understanding. ____________________________________________   FINAL CLINICAL IMPRESSION(S) / ED DIAGNOSES  Final diagnoses:  Nonspecific chest pain      NEW MEDICATIONS STARTED DURING THIS VISIT:  New Prescriptions   No medications on file     Note:  This document was prepared using Dragon voice recognition software and may include unintentional dictation errors.   Arta Silence, MD 06/10/19 [phone]

## 2019-06-16 DIAGNOSIS — R0602 Shortness of breath: Secondary | ICD-10-CM | POA: Diagnosis not present

## 2019-06-16 DIAGNOSIS — Z461 Encounter for fitting and adjustment of hearing aid: Secondary | ICD-10-CM | POA: Insufficient documentation

## 2019-06-16 DIAGNOSIS — R002 Palpitations: Secondary | ICD-10-CM | POA: Diagnosis not present

## 2019-06-16 DIAGNOSIS — N184 Chronic kidney disease, stage 4 (severe): Secondary | ICD-10-CM | POA: Diagnosis not present

## 2019-06-16 DIAGNOSIS — I739 Peripheral vascular disease, unspecified: Secondary | ICD-10-CM | POA: Diagnosis not present

## 2019-06-16 DIAGNOSIS — Z789 Other specified health status: Secondary | ICD-10-CM | POA: Insufficient documentation

## 2019-06-16 DIAGNOSIS — I10 Essential (primary) hypertension: Secondary | ICD-10-CM | POA: Diagnosis not present

## 2019-06-16 DIAGNOSIS — G459 Transient cerebral ischemic attack, unspecified: Secondary | ICD-10-CM | POA: Diagnosis not present

## 2019-06-16 DIAGNOSIS — I340 Nonrheumatic mitral (valve) insufficiency: Secondary | ICD-10-CM | POA: Diagnosis not present

## 2019-06-16 DIAGNOSIS — E782 Mixed hyperlipidemia: Secondary | ICD-10-CM | POA: Diagnosis not present

## 2019-06-16 DIAGNOSIS — I209 Angina pectoris, unspecified: Secondary | ICD-10-CM | POA: Diagnosis not present

## 2019-06-16 DIAGNOSIS — I2510 Atherosclerotic heart disease of native coronary artery without angina pectoris: Secondary | ICD-10-CM | POA: Diagnosis not present

## 2019-06-16 DIAGNOSIS — C61 Malignant neoplasm of prostate: Secondary | ICD-10-CM | POA: Insufficient documentation

## 2019-06-16 DIAGNOSIS — C159 Malignant neoplasm of esophagus, unspecified: Secondary | ICD-10-CM | POA: Insufficient documentation

## 2019-07-01 DIAGNOSIS — L538 Other specified erythematous conditions: Secondary | ICD-10-CM | POA: Diagnosis not present

## 2019-07-01 DIAGNOSIS — Z08 Encounter for follow-up examination after completed treatment for malignant neoplasm: Secondary | ICD-10-CM | POA: Diagnosis not present

## 2019-07-01 DIAGNOSIS — L570 Actinic keratosis: Secondary | ICD-10-CM | POA: Diagnosis not present

## 2019-07-01 DIAGNOSIS — D0339 Melanoma in situ of other parts of face: Secondary | ICD-10-CM | POA: Diagnosis not present

## 2019-07-01 DIAGNOSIS — X32XXXA Exposure to sunlight, initial encounter: Secondary | ICD-10-CM | POA: Diagnosis not present

## 2019-07-01 DIAGNOSIS — Z85828 Personal history of other malignant neoplasm of skin: Secondary | ICD-10-CM | POA: Diagnosis not present

## 2019-07-01 DIAGNOSIS — D2262 Melanocytic nevi of left upper limb, including shoulder: Secondary | ICD-10-CM | POA: Diagnosis not present

## 2019-07-01 DIAGNOSIS — D485 Neoplasm of uncertain behavior of skin: Secondary | ICD-10-CM | POA: Diagnosis not present

## 2019-07-01 DIAGNOSIS — L820 Inflamed seborrheic keratosis: Secondary | ICD-10-CM | POA: Diagnosis not present

## 2019-07-01 DIAGNOSIS — D2272 Melanocytic nevi of left lower limb, including hip: Secondary | ICD-10-CM | POA: Diagnosis not present

## 2019-07-01 DIAGNOSIS — D2261 Melanocytic nevi of right upper limb, including shoulder: Secondary | ICD-10-CM | POA: Diagnosis not present

## 2019-07-14 ENCOUNTER — Ambulatory Visit: Payer: Medicare Other | Admitting: Internal Medicine

## 2019-07-21 DIAGNOSIS — J45909 Unspecified asthma, uncomplicated: Secondary | ICD-10-CM | POA: Diagnosis not present

## 2019-07-21 DIAGNOSIS — I209 Angina pectoris, unspecified: Secondary | ICD-10-CM | POA: Diagnosis not present

## 2019-07-21 DIAGNOSIS — I10 Essential (primary) hypertension: Secondary | ICD-10-CM | POA: Diagnosis not present

## 2019-07-21 DIAGNOSIS — K5732 Diverticulitis of large intestine without perforation or abscess without bleeding: Secondary | ICD-10-CM | POA: Diagnosis not present

## 2019-07-30 DIAGNOSIS — Z01818 Encounter for other preprocedural examination: Secondary | ICD-10-CM | POA: Diagnosis not present

## 2019-07-30 DIAGNOSIS — Z01812 Encounter for preprocedural laboratory examination: Secondary | ICD-10-CM | POA: Diagnosis not present

## 2019-07-30 DIAGNOSIS — E1159 Type 2 diabetes mellitus with other circulatory complications: Secondary | ICD-10-CM | POA: Diagnosis not present

## 2019-07-31 DIAGNOSIS — N2581 Secondary hyperparathyroidism of renal origin: Secondary | ICD-10-CM | POA: Diagnosis not present

## 2019-07-31 DIAGNOSIS — E1122 Type 2 diabetes mellitus with diabetic chronic kidney disease: Secondary | ICD-10-CM | POA: Diagnosis not present

## 2019-07-31 DIAGNOSIS — I129 Hypertensive chronic kidney disease with stage 1 through stage 4 chronic kidney disease, or unspecified chronic kidney disease: Secondary | ICD-10-CM | POA: Diagnosis not present

## 2019-07-31 DIAGNOSIS — R809 Proteinuria, unspecified: Secondary | ICD-10-CM | POA: Diagnosis not present

## 2019-07-31 DIAGNOSIS — N183 Chronic kidney disease, stage 3 (moderate): Secondary | ICD-10-CM | POA: Diagnosis not present

## 2019-08-03 DIAGNOSIS — D485 Neoplasm of uncertain behavior of skin: Secondary | ICD-10-CM | POA: Diagnosis not present

## 2019-08-07 DIAGNOSIS — E872 Acidosis: Secondary | ICD-10-CM | POA: Diagnosis not present

## 2019-08-07 DIAGNOSIS — E875 Hyperkalemia: Secondary | ICD-10-CM | POA: Diagnosis not present

## 2019-08-07 DIAGNOSIS — N184 Chronic kidney disease, stage 4 (severe): Secondary | ICD-10-CM | POA: Diagnosis not present

## 2019-08-07 DIAGNOSIS — I129 Hypertensive chronic kidney disease with stage 1 through stage 4 chronic kidney disease, or unspecified chronic kidney disease: Secondary | ICD-10-CM | POA: Diagnosis not present

## 2019-08-07 DIAGNOSIS — E1122 Type 2 diabetes mellitus with diabetic chronic kidney disease: Secondary | ICD-10-CM | POA: Diagnosis not present

## 2019-08-10 DIAGNOSIS — Z01812 Encounter for preprocedural laboratory examination: Secondary | ICD-10-CM | POA: Diagnosis not present

## 2019-08-13 DIAGNOSIS — M109 Gout, unspecified: Secondary | ICD-10-CM | POA: Diagnosis not present

## 2019-08-13 DIAGNOSIS — I6523 Occlusion and stenosis of bilateral carotid arteries: Secondary | ICD-10-CM | POA: Diagnosis not present

## 2019-08-13 DIAGNOSIS — E1140 Type 2 diabetes mellitus with diabetic neuropathy, unspecified: Secondary | ICD-10-CM | POA: Diagnosis not present

## 2019-08-13 DIAGNOSIS — Z8546 Personal history of malignant neoplasm of prostate: Secondary | ICD-10-CM | POA: Diagnosis not present

## 2019-08-13 DIAGNOSIS — Z8719 Personal history of other diseases of the digestive system: Secondary | ICD-10-CM | POA: Diagnosis not present

## 2019-08-13 DIAGNOSIS — N184 Chronic kidney disease, stage 4 (severe): Secondary | ICD-10-CM | POA: Diagnosis not present

## 2019-08-13 DIAGNOSIS — E1122 Type 2 diabetes mellitus with diabetic chronic kidney disease: Secondary | ICD-10-CM | POA: Diagnosis not present

## 2019-08-13 DIAGNOSIS — I129 Hypertensive chronic kidney disease with stage 1 through stage 4 chronic kidney disease, or unspecified chronic kidney disease: Secondary | ICD-10-CM | POA: Diagnosis not present

## 2019-08-13 DIAGNOSIS — I252 Old myocardial infarction: Secondary | ICD-10-CM | POA: Diagnosis not present

## 2019-08-13 DIAGNOSIS — F419 Anxiety disorder, unspecified: Secondary | ICD-10-CM | POA: Diagnosis not present

## 2019-08-13 DIAGNOSIS — F329 Major depressive disorder, single episode, unspecified: Secondary | ICD-10-CM | POA: Diagnosis not present

## 2019-08-13 DIAGNOSIS — Z8673 Personal history of transient ischemic attack (TIA), and cerebral infarction without residual deficits: Secondary | ICD-10-CM | POA: Diagnosis not present

## 2019-08-13 DIAGNOSIS — Z6833 Body mass index (BMI) 33.0-33.9, adult: Secondary | ICD-10-CM | POA: Diagnosis not present

## 2019-08-13 DIAGNOSIS — K200 Eosinophilic esophagitis: Secondary | ICD-10-CM | POA: Diagnosis not present

## 2019-08-13 DIAGNOSIS — Q402 Other specified congenital malformations of stomach: Secondary | ICD-10-CM | POA: Diagnosis not present

## 2019-08-13 DIAGNOSIS — G4733 Obstructive sleep apnea (adult) (pediatric): Secondary | ICD-10-CM | POA: Diagnosis not present

## 2019-08-13 DIAGNOSIS — Z8619 Personal history of other infectious and parasitic diseases: Secondary | ICD-10-CM | POA: Diagnosis not present

## 2019-08-13 DIAGNOSIS — K449 Diaphragmatic hernia without obstruction or gangrene: Secondary | ICD-10-CM | POA: Diagnosis not present

## 2019-08-13 DIAGNOSIS — Z09 Encounter for follow-up examination after completed treatment for conditions other than malignant neoplasm: Secondary | ICD-10-CM | POA: Diagnosis not present

## 2019-08-13 DIAGNOSIS — E669 Obesity, unspecified: Secondary | ICD-10-CM | POA: Diagnosis not present

## 2019-08-13 DIAGNOSIS — Z9911 Dependence on respirator [ventilator] status: Secondary | ICD-10-CM | POA: Diagnosis not present

## 2019-08-13 DIAGNOSIS — E7800 Pure hypercholesterolemia, unspecified: Secondary | ICD-10-CM | POA: Diagnosis not present

## 2019-08-13 DIAGNOSIS — K2270 Barrett's esophagus without dysplasia: Secondary | ICD-10-CM | POA: Diagnosis not present

## 2019-08-13 DIAGNOSIS — J449 Chronic obstructive pulmonary disease, unspecified: Secondary | ICD-10-CM | POA: Diagnosis not present

## 2019-08-13 DIAGNOSIS — I2510 Atherosclerotic heart disease of native coronary artery without angina pectoris: Secondary | ICD-10-CM | POA: Diagnosis not present

## 2019-08-13 DIAGNOSIS — K209 Esophagitis, unspecified: Secondary | ICD-10-CM | POA: Diagnosis not present

## 2019-08-13 DIAGNOSIS — K2210 Ulcer of esophagus without bleeding: Secondary | ICD-10-CM | POA: Diagnosis not present

## 2019-08-13 DIAGNOSIS — Z8501 Personal history of malignant neoplasm of esophagus: Secondary | ICD-10-CM | POA: Diagnosis not present

## 2019-08-13 DIAGNOSIS — G8929 Other chronic pain: Secondary | ICD-10-CM | POA: Diagnosis not present

## 2019-08-13 DIAGNOSIS — N400 Enlarged prostate without lower urinary tract symptoms: Secondary | ICD-10-CM | POA: Diagnosis not present

## 2019-09-04 DIAGNOSIS — E1159 Type 2 diabetes mellitus with other circulatory complications: Secondary | ICD-10-CM | POA: Diagnosis not present

## 2019-09-10 DIAGNOSIS — D0339 Melanoma in situ of other parts of face: Secondary | ICD-10-CM | POA: Diagnosis not present

## 2019-09-10 DIAGNOSIS — Z85828 Personal history of other malignant neoplasm of skin: Secondary | ICD-10-CM | POA: Diagnosis not present

## 2019-09-10 DIAGNOSIS — L578 Other skin changes due to chronic exposure to nonionizing radiation: Secondary | ICD-10-CM | POA: Diagnosis not present

## 2019-09-10 DIAGNOSIS — L988 Other specified disorders of the skin and subcutaneous tissue: Secondary | ICD-10-CM | POA: Diagnosis not present

## 2019-09-10 DIAGNOSIS — L814 Other melanin hyperpigmentation: Secondary | ICD-10-CM | POA: Diagnosis not present

## 2019-09-11 DIAGNOSIS — R809 Proteinuria, unspecified: Secondary | ICD-10-CM | POA: Diagnosis not present

## 2019-09-11 DIAGNOSIS — Z794 Long term (current) use of insulin: Secondary | ICD-10-CM | POA: Diagnosis not present

## 2019-09-11 DIAGNOSIS — E1142 Type 2 diabetes mellitus with diabetic polyneuropathy: Secondary | ICD-10-CM | POA: Diagnosis not present

## 2019-09-11 DIAGNOSIS — N1832 Chronic kidney disease, stage 3b: Secondary | ICD-10-CM | POA: Diagnosis not present

## 2019-09-11 DIAGNOSIS — E1165 Type 2 diabetes mellitus with hyperglycemia: Secondary | ICD-10-CM | POA: Diagnosis not present

## 2019-09-11 DIAGNOSIS — E1159 Type 2 diabetes mellitus with other circulatory complications: Secondary | ICD-10-CM | POA: Diagnosis not present

## 2019-09-11 DIAGNOSIS — E1129 Type 2 diabetes mellitus with other diabetic kidney complication: Secondary | ICD-10-CM | POA: Diagnosis not present

## 2019-09-11 DIAGNOSIS — E1121 Type 2 diabetes mellitus with diabetic nephropathy: Secondary | ICD-10-CM | POA: Diagnosis not present

## 2019-10-07 ENCOUNTER — Ambulatory Visit (INDEPENDENT_AMBULATORY_CARE_PROVIDER_SITE_OTHER): Payer: Medicare Other | Admitting: Internal Medicine

## 2019-10-07 ENCOUNTER — Other Ambulatory Visit: Payer: Self-pay

## 2019-10-07 DIAGNOSIS — R0602 Shortness of breath: Secondary | ICD-10-CM

## 2019-10-13 DIAGNOSIS — E119 Type 2 diabetes mellitus without complications: Secondary | ICD-10-CM | POA: Diagnosis not present

## 2019-10-19 ENCOUNTER — Ambulatory Visit (INDEPENDENT_AMBULATORY_CARE_PROVIDER_SITE_OTHER): Payer: Medicare Other | Admitting: Internal Medicine

## 2019-10-19 ENCOUNTER — Encounter: Payer: Self-pay | Admitting: Internal Medicine

## 2019-10-19 ENCOUNTER — Other Ambulatory Visit: Payer: Self-pay

## 2019-10-19 VITALS — BP 144/70 | HR 58 | Temp 97.9°F | Resp 16 | Ht 66.0 in | Wt 202.0 lb

## 2019-10-19 DIAGNOSIS — G4733 Obstructive sleep apnea (adult) (pediatric): Secondary | ICD-10-CM | POA: Diagnosis not present

## 2019-10-19 DIAGNOSIS — I6523 Occlusion and stenosis of bilateral carotid arteries: Secondary | ICD-10-CM

## 2019-10-19 DIAGNOSIS — Z9989 Dependence on other enabling machines and devices: Secondary | ICD-10-CM | POA: Diagnosis not present

## 2019-10-19 DIAGNOSIS — I10 Essential (primary) hypertension: Secondary | ICD-10-CM

## 2019-10-19 DIAGNOSIS — T7840XD Allergy, unspecified, subsequent encounter: Secondary | ICD-10-CM

## 2019-10-19 DIAGNOSIS — K2100 Gastro-esophageal reflux disease with esophagitis, without bleeding: Secondary | ICD-10-CM | POA: Diagnosis not present

## 2019-10-19 DIAGNOSIS — N184 Chronic kidney disease, stage 4 (severe): Secondary | ICD-10-CM | POA: Diagnosis not present

## 2019-10-19 MED ORDER — MONTELUKAST SODIUM 10 MG PO TABS: 10.0000 mg | ORAL_TABLET | Freq: Every day | ORAL | 1 refills | Status: DC

## 2019-10-19 NOTE — Progress Notes (Signed)
[facility] [place], [place] [address]  Pulmonary Sleep Medicine   Office Visit Note  Patient Name: Mike Decker DOB: [birth year] MRN [id number]  Date of Service: 10/19/2019  Complaints/HPI: Pt is here for follow up.  Overall his breathing is good.  He reports an increase in his allergies due to seasonal change at this time. Pt reports good compliance with CPAP therapy. Cleaning machine by hand, and changing filters and tubing as directed. Denies headaches, sinus issues, palpitations, or hemoptysis.       ROS  General: (-) fever, (-) chills, (-) night sweats, (-) weakness Skin: (-) rashes, (-) itching,. Eyes: (-) visual changes, (-) redness, (-) itching. Nose and Sinuses: (-) nasal stuffiness or itchiness, (-) postnasal drip, (-) nosebleeds, (-) sinus trouble. Mouth and Throat: (-) sore throat, (-) hoarseness. Neck: (-) swollen glands, (-) enlarged thyroid, (-) neck pain. Respiratory: - cough, (-) bloody sputum, + shortness of breath, - wheezing. Cardiovascular: - ankle swelling, (-) chest pain. Lymphatic: (-) lymph node enlargement. Neurologic: (-) numbness, (-) tingling. Psychiatric: (-) anxiety, (-) depression   Current Medication: Outpatient Encounter Medications as of 10/19/2019  Medication Sig  . acetaminophen (TYLENOL) 500 MG tablet Take 1,000 mg by mouth every 6 (six) hours as needed for mild pain, moderate pain or headache.  . allopurinol (ZYLOPRIM) 100 MG tablet Take 200 mg by mouth 2 (two) times daily.   Marland Kitchen amLODipine (NORVASC) 5 MG tablet Take by mouth.  Marland Kitchen azithromycin (ZITHROMAX) 250 MG tablet Take as directed  . cetirizine (ZYRTEC) 5 MG tablet Take 5 mg by mouth daily as needed for allergies or rhinitis.   . citalopram (CELEXA) 10 MG tablet Take 10 mg by mouth daily.  . clopidogrel (PLAVIX) 75 MG tablet Take 1 tablet (75 mg total) by mouth daily.  . colchicine 0.6 MG tablet Take 1 tablet (0.6 mg total) by mouth daily. (Patient taking  differently: Take 1.8 mg by mouth daily as needed (gout flares). )  . Cyanocobalamin (VITAMIN B-12) 1000 MCG SUBL Take by mouth.  . dextromethorphan-guaiFENesin (MUCINEX DM) 30-600 MG 12hr tablet Take 1 tablet by mouth 2 (two) times daily as needed for cough.  . Dulaglutide (TRULICITY) 1.5 GU/4.4IH SOPN Trulicity 1.5 KV/4.2 mL subcutaneous pen injector  INJECT 1.5 MG SUBCUTANEOUSLY EVERY 7 (SEVEN) DAYS  . EPINEPHrine (EPI-PEN) 0.3 mg/0.3 mL SOAJ injection Inject into the muscle as needed (anaphylaxis).   . fluticasone (FLONASE) 50 MCG/ACT nasal spray Place 2 sprays into both nostrils daily.   . folic acid (V-R FOLIC ACID) 595 MCG tablet Take 1 tablet (400 mcg total) by mouth daily.  . furosemide (LASIX) 20 MG tablet Take 2 tablets (40 mg total) by mouth daily.  Vanessa Kick Ethyl 1 g CAPS Take by mouth.  . insulin aspart (NOVOLOG FLEXPEN) 100 UNIT/ML FlexPen Inject 20-32 Units into the skin See admin instructions. 20-32 units THREE daily as directed  . insulin glargine (LANTUS) 100 UNIT/ML injection Inject 75 Units into the skin at bedtime.   Marland Kitchen ipratropium-albuterol (DUONEB) 0.5-2.5 (3) MG/3ML SOLN Take 3 mLs by nebulization every 4 (four) hours as needed.  . isosorbide mononitrate (IMDUR) 60 MG 24 hr tablet Take by mouth.  . losartan (COZAAR) 25 MG tablet Take by mouth.  . magnesium gluconate (MAGONATE) 500 MG tablet Take 500 mg by mouth 2 (two) times daily.   . methocarbamol (ROBAXIN) 500 MG tablet Take 500 mg by mouth 2 (two) times daily.   . metoprolol tartrate (LOPRESSOR) 50 MG  tablet Take 75 mg by mouth 2 (two) times daily.   . nitroGLYCERIN (NITROSTAT) 0.4 MG SL tablet Place 0.4 mg under the tongue every 5 (five) minutes x 3 doses as needed for chest pain.   . Omega-3 Fatty Acids (FISH OIL) 1000 MG CAPS Take 1 capsule by mouth daily.   . pantoprazole (PROTONIX) 40 MG tablet Take 40 mg by mouth 2 (two) times daily.  . rosuvastatin (CRESTOR) 40 MG tablet Take 40 mg by mouth every evening.    . Vitamin D, Ergocalciferol, (DRISDOL) 50000 units CAPS capsule Take 50,000 Units by mouth every 30 (thirty) days.   . [DISCONTINUED] predniSONE (DELTASONE) 10 MG tablet Use per dose pack  . [DISCONTINUED] ranolazine (RANEXA) 500 MG 12 hr tablet Take 1 tablet (500 mg total) by mouth 2 (two) times daily.  . [DISCONTINUED] traZODone (DESYREL) 50 MG tablet Take 0.5 tablets (25 mg total) by mouth at bedtime as needed for sleep.  . budesonide-formoterol (SYMBICORT) 160-4.5 MCG/ACT inhaler Inhale 2 puffs into the lungs 2 (two) times daily.   No facility-administered encounter medications on file as of 10/19/2019.     Surgical History: Past Surgical History:  Procedure Laterality Date  . CATARACT EXTRACTION  2011, 2012  . COLONOSCOPY N/A 12/14/2018   Procedure: COLONOSCOPY;  Surgeon: Virgel Manifold, MD;  Location: [facility] ENDOSCOPY;  Service: Endoscopy;  Laterality: N/A;  . CORONARY ANGIOPLASTY WITH STENT PLACEMENT  2012  . ESOPHAGOGASTRODUODENOSCOPY N/A 12/14/2018   Procedure: ESOPHAGOGASTRODUODENOSCOPY (EGD);  Surgeon: Virgel Manifold, MD;  Location: [facility] ENDOSCOPY;  Service: Endoscopy;  Laterality: N/A;  . ESOPHAGOGASTRODUODENOSCOPY (EGD) WITH PROPOFOL N/A 06/01/2016   Procedure: ESOPHAGOGASTRODUODENOSCOPY (EGD) WITH PROPOFOL;  Surgeon: Manya Silvas, MD;  Location: [facility] ENDOSCOPY;  Service: Endoscopy;  Laterality: N/A;  . HERNIA REPAIR     x2  . INTRAOCULAR LENS INSERTION    . LEFT HEART CATH AND CORONARY ANGIOGRAPHY N/A 05/09/2017   Procedure: Left Heart Cath and Coronary Angiography;  Surgeon: Yolonda Kida, MD;  Location: [place] CV LAB;  Service: Cardiovascular;  Laterality: N/A;  . LEFT HEART CATH AND CORONARY ANGIOGRAPHY N/A 12/08/2018   Procedure: LEFT HEART CATH AND CORONARY ANGIOGRAPHY and possible PCI and stent;  Surgeon: Yolonda Kida, MD;  Location: [place] CV LAB;  Service: Cardiovascular;  Laterality: N/A;  . NOSE SURGERY     submucous resection  .  PROSTATE SURGERY  2002  . ROTATOR CUFF REPAIR Right     Medical History: Past Medical History:  Diagnosis Date  . Anemia   . Arthritis   . Atrioventricular canal (AVC)    irregular heart beats  . Barrett esophagus   . 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 Chronic diastolic CHF (congestive heart failure) ([place])   . Colon polyp   . Diabetes mellitus without complication ([place])   . Diverticulosis   . Gout   . Heart disease   . Hemangioma    liver  . Hyperlipidemia   . Hypertension   . Myocardial infarct ([place])   . Ocular hypertension   . Peripheral vascular disease ([place])   . Skin cancer   . Sleep apnea   . Vitreoretinal degeneration     Family History: Family History  Problem Relation Age of Onset  . Arthritis Mother   . Stroke Maternal Grandfather   . Breast cancer Neg Hx     Social History: Social History   Socioeconomic History  . Marital status: Married    Spouse  name: Not on file  . Number of children: Not on file  . Years of education: Not on file  . Highest education level: Not on file  Occupational History  . Not on file  Social Needs  . Financial resource strain: Not on file  . Food insecurity    Worry: Not on file    Inability: Not on file  . Transportation needs    Medical: Not on file    Non-medical: Not on file  Tobacco Use  . Smoking status: Former Smoker    Years: 20.00    Types: Cigarettes    Quit date: 12/10/1976    Years since quitting: 42.8  . Smokeless tobacco: Never Used  Substance and Sexual Activity  . Alcohol use: No  . Drug use: No  . Sexual activity: Not on file  Lifestyle  . Physical activity    Days per week: Not on file    Minutes per session: Not on file  . Stress: Not on file  Relationships  . Social Herbalist on phone: Not on file    Gets together: Not on file    Attends religious service: Not on file    Active member of club or organization: Not on file    Attends meetings of clubs or  organizations: Not on file    Relationship status: Not on file  . Intimate partner violence    Fear of current or ex partner: Not on file    Emotionally abused: Not on file    Physically abused: Not on file    Forced sexual activity: Not on file  Other Topics Concern  . Not on file  Social History Narrative  . Not on file    Vital Signs: Blood pressure (!) 144/70, pulse (!) 58, temperature 97.9 F (36.6 C), resp. rate 16, height 5\' 6"  (1.676 m), weight 202 lb (91.6 kg), SpO2 97 %.  Examination: General Appearance: The patient is well-developed, well-nourished, and in no distress. Skin: Gross inspection of skin unremarkable. Head: normocephalic, no gross deformities. Eyes: no gross deformities noted. ENT: ears appear grossly normal no exudates. Neck: Supple. No thyromegaly. No LAD. Respiratory: clear bilaterally. Cardiovascular: Normal S1 and S2 without murmur or rub. Extremities: No cyanosis. pulses are equal. Neurologic: Alert and oriented. No involuntary movements.  LABS: No results found for this or any previous visit (from the past 2160 hour(s)).  Radiology: Dg Chest 2 View  Result Date: 06/10/2019 CLINICAL DATA:  Chest pain radiating to the left arm. EXAM: CHEST - 2 VIEW COMPARISON:  Chest radiograph 12/10/2018 and CT 12/11/2018 FINDINGS: The cardiac silhouette remains upper limits of normal in size. Interstitial opacities on the prior radiograph have largely resolved with only mild prominence of the interstitial markings currently. No airspace consolidation, overt pulmonary edema, pleural effusion, or pneumothorax is identified. No acute osseous abnormality is seen. IMPRESSION: Largely resolved interstitial opacities. No evidence of active cardiopulmonary disease. Electronically Signed   By: Logan Bores M.D.   On: 06/10/2019 08:23    No results found.  No results found.    Assessment and Plan: Patient Active Problem List   Diagnosis Date Noted  . DM type 2 with  diabetic peripheral neuropathy ([place]) 01/25/2019  . CKD (chronic kidney disease) stage 4, GFR 15-29 ml/min (HCC) 01/21/2019  . Gout 01/21/2019  . MR (mitral regurgitation) 01/21/2019  . COPD (chronic obstructive pulmonary disease) ([place]) 01/08/2019  . OSA (obstructive sleep apnea) 01/08/2019  . Chronic diastolic heart  failure ([place]) 12/30/2018  . Polyp of sigmoid colon   . Benign neoplasm of ascending colon   . Hemorrhoids without complication   . Diverticulosis of large intestine without diverticulitis   . Reflux esophagitis   . Lymphangiectasia   . Arteriovenous malformation of duodenum   . Symptomatic anemia   . SOB (shortness of breath)   . Iron deficiency anemia due to chronic blood loss   . Unstable angina ([place])   . Anemia of chronic renal failure, stage 4 (severe) ([place])   . Chest pain 11/14/2018  . Sciatica 11/12/2018  . CAD (coronary artery disease) 02/27/2018  . Sacroiliac joint pain 10/08/2017  . Pain in limb 09/24/2017  . Neuropathy 07/24/2017  . NSTEMI (non-ST elevated myocardial infarction) ([place]) 05/06/2017  . PAD (peripheral artery disease) ([place]) 05/01/2017  . History of stroke 01/02/2017  . Barrett's esophagus 10/02/2016  . Carotid stenosis 10/02/2016  . Essential hypertension 10/02/2016  . Hyperlipidemia 10/02/2016  . Diabetes ([place]) 10/02/2016  . 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 Uncontrolled type 2 diabetes mellitus with hyperglycemia, with long-term current use of insulin ([place]) 04/10/2016  . TIA (transient ischemic attack) 10/08/2015    1. OSA on CPAP Continue to wear cpap as directed.   2. Essential hypertension Slightly elevated, pt will see his PCP soon, and is aware of high bp.  3. Chronic renal failure syndrome, stage 4 (severe) ([place]) Continue to follow up with Nephrology.   4. Gastroesophageal reflux disease with esophagitis without hemorrhage Pt will continue to see GI, for GERD and Barrett's esophagus.   5.  Allergy, subsequent encounter Try Montelukast as discussed.  Will follow up in two months.   General Counseling: I have discussed the findings of the evaluation and examination with Marland Kitchen.  I have also discussed any further diagnostic evaluation thatmay be needed or ordered today. Nero verbalizes understanding of the findings of todays visit. We also reviewed his medications today and discussed drug interactions and side effects including but not limited excessive drowsiness and altered mental states. We also discussed that there is always a risk not just to him but also people around him. he has been encouraged to call the office with any questions or concerns that should arise related to todays visit.    Time spent: 25 This patient was seen by Orson Gear AGNP-C in Collaboration with Dr. Devona Konig as a part of collaborative care agreement.   I have personally obtained a history, examined the patient, evaluated laboratory and imaging results, formulated the assessment and plan and placed orders.    Allyne Gee, MD [facility] Pulmonary and Critical Care Sleep medicine

## 2019-10-27 DIAGNOSIS — R801 Persistent proteinuria, unspecified: Secondary | ICD-10-CM | POA: Diagnosis not present

## 2019-10-27 DIAGNOSIS — N183 Chronic kidney disease, stage 3 unspecified: Secondary | ICD-10-CM | POA: Diagnosis not present

## 2019-10-27 DIAGNOSIS — E1122 Type 2 diabetes mellitus with diabetic chronic kidney disease: Secondary | ICD-10-CM | POA: Diagnosis not present

## 2019-10-27 DIAGNOSIS — N2581 Secondary hyperparathyroidism of renal origin: Secondary | ICD-10-CM | POA: Diagnosis not present

## 2019-10-27 DIAGNOSIS — I129 Hypertensive chronic kidney disease with stage 1 through stage 4 chronic kidney disease, or unspecified chronic kidney disease: Secondary | ICD-10-CM | POA: Diagnosis not present

## 2019-10-28 NOTE — Procedures (Signed)
[place], [id number]  DATE OF SERVICE: October 07, 2019  Complete Pulmonary Function Testing Interpretation:  FINDINGS:  The forced vital capacity is normal.  The FEV1 is normal.  FEV1 FVC ratio is normal.  Postbronchodilator no significant change in FEV1.  Total lung capacity is mildly decreased.  Residual volume is decreased residual volume total lung capacity ratio is decreased.  FRC is decreased.  DLCO is mildly decreased.  DLCO corrected for alveolar volume is decreased.  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  Allyne Gee, MD [name] Pulmonary Critical Care Medicine Sleep Medicine

## 2019-10-30 ENCOUNTER — Encounter: Payer: Self-pay | Admitting: Internal Medicine

## 2019-10-30 DIAGNOSIS — E1122 Type 2 diabetes mellitus with diabetic chronic kidney disease: Secondary | ICD-10-CM | POA: Diagnosis not present

## 2019-10-30 DIAGNOSIS — E872 Acidosis, unspecified: Secondary | ICD-10-CM | POA: Insufficient documentation

## 2019-10-30 DIAGNOSIS — I129 Hypertensive chronic kidney disease with stage 1 through stage 4 chronic kidney disease, or unspecified chronic kidney disease: Secondary | ICD-10-CM | POA: Insufficient documentation

## 2019-10-30 DIAGNOSIS — N2581 Secondary hyperparathyroidism of renal origin: Secondary | ICD-10-CM | POA: Diagnosis not present

## 2019-10-30 DIAGNOSIS — D631 Anemia in chronic kidney disease: Secondary | ICD-10-CM | POA: Diagnosis not present

## 2019-10-30 DIAGNOSIS — N184 Chronic kidney disease, stage 4 (severe): Secondary | ICD-10-CM | POA: Diagnosis not present

## 2019-10-30 DIAGNOSIS — E875 Hyperkalemia: Secondary | ICD-10-CM | POA: Diagnosis not present

## 2019-10-30 LAB — PULMONARY FUNCTION TEST

## 2019-11-10 ENCOUNTER — Inpatient Hospital Stay: Payer: Medicare Other | Attending: Oncology

## 2019-11-10 ENCOUNTER — Other Ambulatory Visit: Payer: Self-pay

## 2019-11-10 ENCOUNTER — Encounter: Payer: Self-pay | Admitting: Oncology

## 2019-11-10 ENCOUNTER — Inpatient Hospital Stay (HOSPITAL_BASED_OUTPATIENT_CLINIC_OR_DEPARTMENT_OTHER): Payer: Medicare Other | Admitting: Oncology

## 2019-11-10 VITALS — BP 160/74 | HR 54 | Temp 98.1°F | Resp 18 | Wt 208.1 lb

## 2019-11-10 DIAGNOSIS — D500 Iron deficiency anemia secondary to blood loss (chronic): Secondary | ICD-10-CM

## 2019-11-10 DIAGNOSIS — E538 Deficiency of other specified B group vitamins: Secondary | ICD-10-CM

## 2019-11-10 DIAGNOSIS — N189 Chronic kidney disease, unspecified: Secondary | ICD-10-CM | POA: Insufficient documentation

## 2019-11-10 DIAGNOSIS — N62 Hypertrophy of breast: Secondary | ICD-10-CM

## 2019-11-10 DIAGNOSIS — D631 Anemia in chronic kidney disease: Secondary | ICD-10-CM | POA: Insufficient documentation

## 2019-11-10 LAB — CBC WITH DIFFERENTIAL/PLATELET
Abs Immature Granulocytes: 0.07 10*3/uL (ref 0.00–0.07)
Basophils Absolute: 0.1 10*3/uL (ref 0.0–0.1)
Basophils Relative: 1 %
Eosinophils Absolute: 0.8 10*3/uL — ABNORMAL HIGH (ref 0.0–0.5)
Eosinophils Relative: 9 %
HCT: 36.1 % — ABNORMAL LOW (ref 39.0–52.0)
Hemoglobin: 11.6 g/dL — ABNORMAL LOW (ref 13.0–17.0)
Immature Granulocytes: 1 %
Lymphocytes Relative: 14 %
Lymphs Abs: 1.3 10*3/uL (ref 0.7–4.0)
MCH: 30.4 pg (ref 26.0–34.0)
MCHC: 32.1 g/dL (ref 30.0–36.0)
MCV: 94.5 fL (ref 80.0–100.0)
Monocytes Absolute: 0.7 10*3/uL (ref 0.1–1.0)
Monocytes Relative: 8 %
Neutro Abs: 5.9 10*3/uL (ref 1.7–7.7)
Neutrophils Relative %: 67 %
Platelets: 237 10*3/uL (ref 150–400)
RBC: 3.82 MIL/uL — ABNORMAL LOW (ref 4.22–5.81)
RDW: 13.9 % (ref 11.5–15.5)
WBC: 8.9 10*3/uL (ref 4.0–10.5)
nRBC: 0 % (ref 0.0–0.2)

## 2019-11-10 LAB — COMPREHENSIVE METABOLIC PANEL
ALT: 12 U/L (ref 0–44)
AST: 12 U/L — ABNORMAL LOW (ref 15–41)
Albumin: 3.7 g/dL (ref 3.5–5.0)
Alkaline Phosphatase: 72 U/L (ref 38–126)
Anion gap: 7 (ref 5–15)
BUN: 43 mg/dL — ABNORMAL HIGH (ref 8–23)
CO2: 21 mmol/L — ABNORMAL LOW (ref 22–32)
Calcium: 8.6 mg/dL — ABNORMAL LOW (ref 8.9–10.3)
Chloride: 106 mmol/L (ref 98–111)
Creatinine, Ser: 2.01 mg/dL — ABNORMAL HIGH (ref 0.61–1.24)
GFR calc Af Amer: 36 mL/min — ABNORMAL LOW (ref >60)
GFR calc non Af Amer: 31 mL/min — ABNORMAL LOW (ref >60)
Glucose, Bld: 343 mg/dL — ABNORMAL HIGH (ref 70–99)
Potassium: 5 mmol/L (ref 3.5–5.1)
Sodium: 134 mmol/L — ABNORMAL LOW (ref 135–145)
Total Bilirubin: 0.5 mg/dL (ref 0.3–1.2)
Total Protein: 6.9 g/dL (ref 6.5–8.1)

## 2019-11-10 LAB — IRON AND TIBC
Iron: 47 ug/dL (ref 45–182)
Saturation Ratios: 15 % — ABNORMAL LOW (ref 17.9–39.5)
TIBC: 308 ug/dL (ref 250–450)
UIBC: 261 ug/dL

## 2019-11-10 LAB — FERRITIN: Ferritin: 77 ng/mL (ref 24–336)

## 2019-11-10 LAB — VITAMIN B12: Vitamin B-12: 912 pg/mL (ref 180–914)

## 2019-11-10 MED ORDER — FERROUS SULFATE 325 (65 FE) MG PO TBEC: 325.0000 mg | DELAYED_RELEASE_TABLET | Freq: Every day | ORAL | 3 refills | Status: DC

## 2019-11-10 NOTE — Progress Notes (Signed)
Patient has had MOEs for melanoma and an EGD since last visit.

## 2019-11-10 NOTE — Progress Notes (Signed)
Hematology/Oncology Follow Up Note [facility]  [phone] [phone]  Patient Care Team: Cletis Athens, MD as PCP - General (Cardiology) Cletis Athens, MD as Referring Physician (Cardiology) Christene Lye, MD (General Surgery) Alisa Graff, FNP as Nurse Practitioner (Family Medicine) Yolonda Kida, MD as Consulting Physician (Cardiology) Gabriel Carina, Betsey Holiday, MD as Physician Assistant (Endocrinology) Lavonia Dana, MD as Consulting Physician (Nephrology) Allyne Gee, MD as Consulting Physician (Pulmonary Disease) [name], MD as Consulting Physician (Gastroenterology)   Name of the patient: Mike Decker  [id number]  1941/03/18   REASON FOR VISIT  follow-up for management of iron deficiency anemia, anemia and CKD, recent hospitalization   PERTINENT HEMATOLOGY HISTORY received IV Venofer daily x3 as well as multiple units of PRBC transfusion. GI was consulted and patient had EGD done during his hospitalization.  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  Treated with argon plasma coagulation.  Colonoscopy showed polyps which were removed. Patient was discharged on 12/15/2018.  # 03/23/2019 mammogram showed gynecomastia.   INTERVAL HISTORY 78 y.o. male with PMH listed as below present to follow-up for anemia of CKD.  He reports feeling well today.  Fatigue level is stable, at baseline. Not worse.  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 Pathology was not available to me.  Also s/p EGD at [facility] on 08/13/2019 negative for malignancy.    Review of Systems  Constitutional: Positive for fatigue. Negative for appetite change, chills, fever and unexpected weight change.  HENT:   Negative for hearing loss and voice change.   Eyes: Negative for eye problems and icterus.  Respiratory: Negative for chest tightness, cough and shortness of breath.   Cardiovascular:  Negative for chest pain and leg swelling.  Gastrointestinal: Negative for abdominal distention and abdominal pain.  Endocrine: Negative for hot flashes.  Genitourinary: Negative for difficulty urinating, dysuria and frequency.   Musculoskeletal: Negative for arthralgias.  Skin: Negative for itching and rash.  Neurological: Negative for light-headedness and numbness.  Hematological: Negative for adenopathy. Does not bruise/bleed easily.  Psychiatric/Behavioral: Negative for confusion.    Allergies  Allergen Reactions  . Gabapentin     Other reaction(s): Other (See Comments) Tremors  . Peanut-Containing Drug Products Anaphylaxis  . Penicillins Hives and Rash    Has patient had a PCN reaction causing immediate rash, facial/tongue/throat swelling, SOB or lightheadedness with hypotension: Yes Has patient had a PCN reaction causing severe rash involving mucus membranes or skin necrosis: No Has patient had a PCN reaction that required hospitalization No Has patient had a PCN reaction occurring within the last 10 years: No If all of the above answers are "NO", then may proceed with Cephalosporin use.  . Bee Venom Swelling  . Influenza Vaccines Hives  . Inh [Isoniazid] Hives  . Kenalog [Triamcinolone Acetonide] Hives  . Levaquin [Levofloxacin] Other (See Comments)    Tendon, ligament pain.   . Nalfon [Fenoprofen Calcium] Hives  . Naproxen   . Peanut Oil   . Nsaids Rash    Nalfon 600 Nalfon 600     Past Medical History:  Diagnosis Date  . Anemia   . Arthritis   . Atrioventricular canal (AVC)    irregular heart beats  . Barrett esophagus   . 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 Chronic diastolic CHF (congestive heart failure) ([place])   . Colon polyp   . Diabetes mellitus without complication ([place])   .  Diverticulosis   . Gout   . Heart disease   . Hemangioma    liver  . Hyperlipidemia   . Hypertension   . Myocardial infarct ([place])   . Ocular hypertension   .  Peripheral vascular disease ([place])   . Skin cancer   . Sleep apnea   . Vitreoretinal degeneration      Past Surgical History:  Procedure Laterality Date  . CATARACT EXTRACTION  2011, 2012  . COLONOSCOPY N/A 12/14/2018   Procedure: COLONOSCOPY;  Surgeon: Virgel Manifold, MD;  Location: [facility] ENDOSCOPY;  Service: Endoscopy;  Laterality: N/A;  . CORONARY ANGIOPLASTY WITH STENT PLACEMENT  2012  . ESOPHAGOGASTRODUODENOSCOPY N/A 12/14/2018   Procedure: ESOPHAGOGASTRODUODENOSCOPY (EGD);  Surgeon: Virgel Manifold, MD;  Location: [facility] ENDOSCOPY;  Service: Endoscopy;  Laterality: N/A;  . ESOPHAGOGASTRODUODENOSCOPY (EGD) WITH PROPOFOL N/A 06/01/2016   Procedure: ESOPHAGOGASTRODUODENOSCOPY (EGD) WITH PROPOFOL;  Surgeon: Manya Silvas, MD;  Location: [facility] ENDOSCOPY;  Service: Endoscopy;  Laterality: N/A;  . HERNIA REPAIR     x2  . INTRAOCULAR LENS INSERTION    . LEFT HEART CATH AND CORONARY ANGIOGRAPHY N/A 05/09/2017   Procedure: Left Heart Cath and Coronary Angiography;  Surgeon: Yolonda Kida, MD;  Location: [facility] CV LAB;  Service: Cardiovascular;  Laterality: N/A;  . LEFT HEART CATH AND CORONARY ANGIOGRAPHY N/A 12/08/2018   Procedure: LEFT HEART CATH AND CORONARY ANGIOGRAPHY and possible PCI and stent;  Surgeon: Yolonda Kida, MD;  Location: [facility] CV LAB;  Service: Cardiovascular;  Laterality: N/A;  . NOSE SURGERY     submucous resection  . PROSTATE SURGERY  2002  . ROTATOR CUFF REPAIR Right     Social History   Socioeconomic History  . Marital status: Married    Spouse name: Not on file  . Number of children: Not on file  . Years of education: Not on file  . Highest education level: Not on file  Occupational History  . Not on file  Social Needs  . Financial resource strain: Not on file  . Food insecurity    Worry: Not on file    Inability: Not on file  . Transportation needs    Medical: Not on file    Non-medical: Not on file  Tobacco Use  .  Smoking status: Former Smoker    Years: 20.00    Types: Cigarettes    Quit date: 12/10/1976    Years since quitting: 42.9  . Smokeless tobacco: Never Used  Substance and Sexual Activity  . Alcohol use: No  . Drug use: No  . Sexual activity: Not on file  Lifestyle  . Physical activity    Days per week: Not on file    Minutes per session: Not on file  . Stress: Not on file  Relationships  . Social Herbalist on phone: Not on file    Gets together: Not on file    Attends religious service: Not on file    Active member of club or organization: Not on file    Attends meetings of clubs or organizations: Not on file    Relationship status: Not on file  . Intimate partner violence    Fear of current or ex partner: Not on file    Emotionally abused: Not on file    Physically abused: Not on file    Forced sexual activity: Not on file  Other Topics Concern  . Not on file  Social History Narrative  .  Not on file    Family History  Problem Relation Age of Onset  . Arthritis Mother   . Stroke Maternal Grandfather   . Breast cancer Neg Hx      Current Outpatient Medications:  .  allopurinol (ZYLOPRIM) 100 MG tablet, Take 200 mg by mouth 2 (two) times daily. , Disp: , Rfl:  .  amLODipine (NORVASC) 5 MG tablet, Take by mouth., Disp: , Rfl:  .  budesonide-formoterol (SYMBICORT) 160-4.5 MCG/ACT inhaler, Inhale 2 puffs into the lungs 2 (two) times daily., Disp: , Rfl:  .  cetirizine (ZYRTEC) 5 MG tablet, Take 5 mg by mouth daily as needed for allergies or rhinitis. , Disp: , Rfl:  .  citalopram (CELEXA) 10 MG tablet, Take 10 mg by mouth daily., Disp: , Rfl:  .  clopidogrel (PLAVIX) 75 MG tablet, Take 1 tablet (75 mg total) by mouth daily., Disp: 30 tablet, Rfl: 0 .  colchicine 0.6 MG tablet, Take 1 tablet (0.6 mg total) by mouth daily. (Patient taking differently: Take 1.8 mg by mouth daily as needed (gout flares). ), Disp: 30 tablet, Rfl: 0 .  Cyanocobalamin (VITAMIN B-12) 1000  MCG SUBL, Take by mouth., Disp: , Rfl:  .  EPINEPHrine (EPI-PEN) 0.3 mg/0.3 mL SOAJ injection, Inject into the muscle as needed (anaphylaxis). , Disp: , Rfl:  .  fluticasone (FLONASE) 50 MCG/ACT nasal spray, Place 2 sprays into both nostrils daily. , Disp: , Rfl: 3 .  folic acid (V-R FOLIC ACID) 119 MCG tablet, Take 1 tablet (400 mcg total) by mouth daily., Disp: 90 tablet, Rfl: 3 .  furosemide (LASIX) 20 MG tablet, Take 2 tablets (40 mg total) by mouth daily., Disp: , Rfl:  .  Icosapent Ethyl 1 g CAPS, Take by mouth., Disp: , Rfl:  .  insulin aspart (NOVOLOG FLEXPEN) 100 UNIT/ML FlexPen, Inject 20-32 Units into the skin See admin instructions. 20-32 units THREE daily as directed, Disp: , Rfl:  .  insulin glargine (LANTUS) 100 UNIT/ML injection, Inject 70 Units into the skin at bedtime. , Disp: , Rfl:  .  isosorbide mononitrate (IMDUR) 60 MG 24 hr tablet, Take by mouth., Disp: , Rfl:  .  losartan (COZAAR) 25 MG tablet, Take by mouth., Disp: , Rfl:  .  magnesium gluconate (MAGONATE) 500 MG tablet, Take 500 mg by mouth 2 (two) times daily. , Disp: , Rfl:  .  methocarbamol (ROBAXIN) 500 MG tablet, Take 500 mg by mouth 2 (two) times daily. , Disp: , Rfl: 0 .  metoprolol tartrate (LOPRESSOR) 50 MG tablet, Take 75 mg by mouth 2 (two) times daily. , Disp: , Rfl:  .  montelukast (SINGULAIR) 10 MG tablet, Take 1 tablet (10 mg total) by mouth at bedtime., Disp: 30 tablet, Rfl: 1 .  nitroGLYCERIN (NITROSTAT) 0.4 MG SL tablet, Place 0.4 mg under the tongue every 5 (five) minutes x 3 doses as needed for chest pain. , Disp: , Rfl:  .  Omega-3 Fatty Acids (FISH OIL) 1000 MG CAPS, Take 1 capsule by mouth daily. , Disp: , Rfl:  .  pantoprazole (PROTONIX) 40 MG tablet, Take 40 mg by mouth 2 (two) times daily., Disp: , Rfl:  .  rosuvastatin (CRESTOR) 40 MG tablet, Take 40 mg by mouth every evening. , Disp: , Rfl:  .  Vitamin D, Ergocalciferol, (DRISDOL) 50000 units CAPS capsule, Take 50,000 Units by mouth every 30  (thirty) days. , Disp: , Rfl: 1 .  acetaminophen (TYLENOL) 500 MG tablet, Take 1,000 mg  by mouth every 6 (six) hours as needed for mild pain, moderate pain or headache., Disp: , Rfl:  .  dextromethorphan-guaiFENesin (MUCINEX DM) 30-600 MG 12hr tablet, Take 1 tablet by mouth 2 (two) times daily as needed for cough., Disp: , Rfl:  .  Dulaglutide (TRULICITY) 1.5 DX/4.1OI SOPN, Trulicity 1.5 NO/6.7 mL subcutaneous pen injector  INJECT 1.5 MG SUBCUTANEOUSLY EVERY 7 (SEVEN) DAYS, Disp: , Rfl:  .  ipratropium-albuterol (DUONEB) 0.5-2.5 (3) MG/3ML SOLN, Take 3 mLs by nebulization every 4 (four) hours as needed. (Patient not taking: Reported on 11/10/2019), Disp: 360 mL, Rfl: 0  Physical exam:  Vitals:   11/10/19 1029  BP: (!) 160/74  Pulse: (!) 54  Resp: 18  Temp: 98.1 F (36.7 C)  Weight: 208 lb 1.6 oz (94.4 kg)   Physical Exam Constitutional:      General: He is not in acute distress.    Appearance: He is obese.  HENT:     Head: Normocephalic and atraumatic.  Eyes:     General: No scleral icterus.    Pupils: Pupils are equal, round, and reactive to light.  Neck:     Musculoskeletal: Normal range of motion and neck supple.  Cardiovascular:     Rate and Rhythm: Normal rate and regular rhythm.     Heart sounds: Normal heart sounds.  Pulmonary:     Effort: Pulmonary effort is normal. No respiratory distress.     Breath sounds: No wheezing.  Abdominal:     General: Bowel sounds are normal. There is no distension.     Palpations: Abdomen is soft. There is no mass.     Tenderness: There is no abdominal tenderness.  Musculoskeletal: Normal range of motion.        General: No deformity.  Skin:    General: Skin is warm and dry.     Findings: No erythema or rash.  Neurological:     Mental Status: He is alert and oriented to person, place, and time.     Cranial Nerves: No cranial nerve deficit.     Coordination: Coordination normal.  Psychiatric:        Behavior: Behavior normal.         Thought Content: Thought content normal.     CMP Latest Ref Rng & Units 11/10/2019  Glucose 70 - 99 mg/dL 343(H)  BUN 8 - 23 mg/dL 43(H)  Creatinine 0.61 - 1.24 mg/dL 2.01(H)  Sodium 135 - 145 mmol/L 134(L)  Potassium 3.5 - 5.1 mmol/L 5.0  Chloride 98 - 111 mmol/L 106  CO2 22 - 32 mmol/L 21(L)  Calcium 8.9 - 10.3 mg/dL 8.6(L)  Total Protein 6.5 - 8.1 g/dL 6.9  Total Bilirubin 0.3 - 1.2 mg/dL 0.5  Alkaline Phos 38 - 126 U/L 72  AST 15 - 41 U/L 12(L)  ALT 0 - 44 U/L 12   CBC Latest Ref Rng & Units 11/10/2019  WBC 4.0 - 10.5 K/uL 8.9  Hemoglobin 13.0 - 17.0 g/dL 11.6(L)  Hematocrit 39.0 - 52.0 % 36.1(L)  Platelets 150 - 400 K/uL 237    No results found.   Assessment and plan  1. Iron deficiency anemia due to chronic blood loss   2. B12 deficiency   3. Chronic kidney disease, unspecified CKD stage    Labs are reviewed and discussed with patient.  Labs are reviewed and discussed with patient. Hemoglobin has been stable.  Hold additional IV iron treatment.  Hemoglobin is stable.  Iron panel shows mildly decreased iron  saturation and ferritin level of 15.  No need for IV iron at this point.  Advise patient to start oral iron supplementation.    CKD, multiple myeloma panel showed no M spike,  Avoid nephrotoxin,  Vitamin B12 deficiency, negative intrinsic factor antibody and antiparital antibody.   vitamin B12 level is 912. he can hold oral B12 supplementation for now.   Follow up in 1 year. Check CBC CMP and B12, iron TIBC ferritin  Orders Placed This Encounter  Procedures  . CBC with Differential    Standing Status:   Future    Standing Expiration Date:   11/09/2020  . Comprehensive metabolic panel    Standing Status:   Future    Standing Expiration Date:   11/09/2020  . Ferritin    Standing Status:   Future    Standing Expiration Date:   11/09/2020  . Iron and TIBC    Standing Status:   Future    Standing Expiration Date:   11/09/2020  . Vitamin B12     Standing Status:   Future    Standing Expiration Date:   11/09/2020       Earlie Server, MD, PhD

## 2019-11-11 ENCOUNTER — Telehealth: Payer: Self-pay

## 2019-11-11 DIAGNOSIS — Z20828 Contact with and (suspected) exposure to other viral communicable diseases: Secondary | ICD-10-CM | POA: Diagnosis not present

## 2019-11-11 NOTE — Telephone Encounter (Signed)
-----   Message from Earlie Server, MD sent at 11/10/2019 11:24 PM EST ----- Please let patient know that his iron testing showed slightly low iron. No need for IV iron.  Recommend him to restart oral iron tablets once daily. Rx will be sent to pharmacy.  Vitamin b12 level is normal and he can stop taking oral b12 tablets.  He can keep his appt at 1 year.

## 2019-11-11 NOTE — Telephone Encounter (Signed)
Patient and his wife informed.

## 2019-11-18 ENCOUNTER — Other Ambulatory Visit: Payer: Self-pay | Admitting: Adult Health

## 2019-11-24 ENCOUNTER — Ambulatory Visit
Admission: RE | Admit: 2019-11-24 | Discharge: 2019-11-24 | Disposition: A | Discharge status: Home / Self Care | Payer: Medicare Other | Attending: Internal Medicine | Admitting: Internal Medicine

## 2019-11-24 ENCOUNTER — Other Ambulatory Visit: Payer: Self-pay | Admitting: Internal Medicine

## 2019-11-24 ENCOUNTER — Ambulatory Visit
Admission: RE | Admit: 2019-11-24 | Discharge: 2019-11-24 | Disposition: A | Discharge status: Home / Self Care | Payer: Medicare Other | Source: Ambulatory Visit | Attending: Internal Medicine | Admitting: Internal Medicine

## 2019-11-24 ENCOUNTER — Other Ambulatory Visit: Payer: Self-pay

## 2019-11-24 DIAGNOSIS — R05 Cough: Secondary | ICD-10-CM

## 2019-11-24 DIAGNOSIS — R062 Wheezing: Secondary | ICD-10-CM

## 2019-11-24 DIAGNOSIS — R059 Cough, unspecified: Secondary | ICD-10-CM

## 2019-12-01 ENCOUNTER — Other Ambulatory Visit
Admission: RE | Admit: 2019-12-01 | Discharge: 2019-12-01 | Disposition: A | Discharge status: Home / Self Care | Payer: Medicare Other | Source: Ambulatory Visit | Attending: Internal Medicine | Admitting: Internal Medicine

## 2019-12-01 DIAGNOSIS — Z0189 Encounter for other specified special examinations: Secondary | ICD-10-CM | POA: Insufficient documentation

## 2019-12-01 LAB — BRAIN NATRIURETIC PEPTIDE: B Natriuretic Peptide: 197 pg/mL — ABNORMAL HIGH (ref 0.0–100.0)

## 2019-12-08 ENCOUNTER — Telehealth: Payer: Self-pay

## 2019-12-08 NOTE — Telephone Encounter (Signed)
Patient rescheduled appointment on 12/22/2019 to 01/04/2020. klh

## 2019-12-11 ENCOUNTER — Other Ambulatory Visit: Payer: Self-pay | Admitting: Adult Health

## 2019-12-14 ENCOUNTER — Emergency Department: Payer: Medicare Other

## 2019-12-14 ENCOUNTER — Inpatient Hospital Stay
Admission: EM | Admit: 2019-12-14 | Discharge: 2019-12-17 | DRG: 291 | Disposition: A | Discharge status: Home / Self Care | Payer: Medicare Other | Attending: Internal Medicine | Admitting: Internal Medicine

## 2019-12-14 ENCOUNTER — Other Ambulatory Visit: Payer: Self-pay

## 2019-12-14 ENCOUNTER — Observation Stay
Admit: 2019-12-14 | Discharge: 2019-12-14 | Disposition: A | Discharge status: Home / Self Care | Payer: Medicare Other | Attending: Internal Medicine | Admitting: Internal Medicine

## 2019-12-14 DIAGNOSIS — Z8673 Personal history of transient ischemic attack (TIA), and cerebral infarction without residual deficits: Secondary | ICD-10-CM

## 2019-12-14 DIAGNOSIS — R1012 Left upper quadrant pain: Secondary | ICD-10-CM

## 2019-12-14 DIAGNOSIS — I5023 Acute on chronic systolic (congestive) heart failure: Secondary | ICD-10-CM | POA: Diagnosis not present

## 2019-12-14 DIAGNOSIS — R778 Other specified abnormalities of plasma proteins: Secondary | ICD-10-CM | POA: Insufficient documentation

## 2019-12-14 DIAGNOSIS — J449 Chronic obstructive pulmonary disease, unspecified: Secondary | ICD-10-CM | POA: Diagnosis present

## 2019-12-14 DIAGNOSIS — K219 Gastro-esophageal reflux disease without esophagitis: Secondary | ICD-10-CM | POA: Diagnosis present

## 2019-12-14 DIAGNOSIS — Z6831 Body mass index (BMI) 31.0-31.9, adult: Secondary | ICD-10-CM

## 2019-12-14 DIAGNOSIS — K5732 Diverticulitis of large intestine without perforation or abscess without bleeding: Secondary | ICD-10-CM | POA: Diagnosis not present

## 2019-12-14 DIAGNOSIS — I25119 Atherosclerotic heart disease of native coronary artery with unspecified angina pectoris: Secondary | ICD-10-CM | POA: Diagnosis present

## 2019-12-14 DIAGNOSIS — I2510 Atherosclerotic heart disease of native coronary artery without angina pectoris: Secondary | ICD-10-CM | POA: Diagnosis present

## 2019-12-14 DIAGNOSIS — I214 Non-ST elevation (NSTEMI) myocardial infarction: Secondary | ICD-10-CM | POA: Diagnosis present

## 2019-12-14 DIAGNOSIS — G4733 Obstructive sleep apnea (adult) (pediatric): Secondary | ICD-10-CM | POA: Diagnosis present

## 2019-12-14 DIAGNOSIS — Z8261 Family history of arthritis: Secondary | ICD-10-CM

## 2019-12-14 DIAGNOSIS — I10 Essential (primary) hypertension: Secondary | ICD-10-CM | POA: Diagnosis not present

## 2019-12-14 DIAGNOSIS — R7989 Other specified abnormal findings of blood chemistry: Secondary | ICD-10-CM | POA: Insufficient documentation

## 2019-12-14 DIAGNOSIS — K2270 Barrett's esophagus without dysplasia: Secondary | ICD-10-CM | POA: Diagnosis present

## 2019-12-14 DIAGNOSIS — N184 Chronic kidney disease, stage 4 (severe): Secondary | ICD-10-CM | POA: Diagnosis not present

## 2019-12-14 DIAGNOSIS — Z888 Allergy status to other drugs, medicaments and biological substances status: Secondary | ICD-10-CM

## 2019-12-14 DIAGNOSIS — E785 Hyperlipidemia, unspecified: Secondary | ICD-10-CM | POA: Diagnosis present

## 2019-12-14 DIAGNOSIS — E669 Obesity, unspecified: Secondary | ICD-10-CM | POA: Diagnosis present

## 2019-12-14 DIAGNOSIS — R0602 Shortness of breath: Secondary | ICD-10-CM | POA: Diagnosis not present

## 2019-12-14 DIAGNOSIS — Z87892 Personal history of anaphylaxis: Secondary | ICD-10-CM

## 2019-12-14 DIAGNOSIS — R0789 Other chest pain: Secondary | ICD-10-CM | POA: Diagnosis not present

## 2019-12-14 DIAGNOSIS — Z91010 Allergy to peanuts: Secondary | ICD-10-CM

## 2019-12-14 DIAGNOSIS — E1165 Type 2 diabetes mellitus with hyperglycemia: Secondary | ICD-10-CM | POA: Diagnosis not present

## 2019-12-14 DIAGNOSIS — Z794 Long term (current) use of insulin: Secondary | ICD-10-CM

## 2019-12-14 DIAGNOSIS — M109 Gout, unspecified: Secondary | ICD-10-CM | POA: Diagnosis present

## 2019-12-14 DIAGNOSIS — E1129 Type 2 diabetes mellitus with other diabetic kidney complication: Secondary | ICD-10-CM | POA: Diagnosis present

## 2019-12-14 DIAGNOSIS — I130 Hypertensive heart and chronic kidney disease with heart failure and stage 1 through stage 4 chronic kidney disease, or unspecified chronic kidney disease: Secondary | ICD-10-CM | POA: Diagnosis not present

## 2019-12-14 DIAGNOSIS — Z87891 Personal history of nicotine dependence: Secondary | ICD-10-CM

## 2019-12-14 DIAGNOSIS — Z85828 Personal history of other malignant neoplasm of skin: Secondary | ICD-10-CM

## 2019-12-14 DIAGNOSIS — Z887 Allergy status to serum and vaccine status: Secondary | ICD-10-CM

## 2019-12-14 DIAGNOSIS — Z91030 Bee allergy status: Secondary | ICD-10-CM

## 2019-12-14 DIAGNOSIS — E1122 Type 2 diabetes mellitus with diabetic chronic kidney disease: Secondary | ICD-10-CM | POA: Diagnosis present

## 2019-12-14 DIAGNOSIS — R0600 Dyspnea, unspecified: Secondary | ICD-10-CM

## 2019-12-14 DIAGNOSIS — I208 Other forms of angina pectoris: Secondary | ICD-10-CM | POA: Diagnosis not present

## 2019-12-14 DIAGNOSIS — Z955 Presence of coronary angioplasty implant and graft: Secondary | ICD-10-CM

## 2019-12-14 DIAGNOSIS — R079 Chest pain, unspecified: Secondary | ICD-10-CM | POA: Diagnosis present

## 2019-12-14 DIAGNOSIS — Z880 Allergy status to penicillin: Secondary | ICD-10-CM

## 2019-12-14 DIAGNOSIS — I248 Other forms of acute ischemic heart disease: Secondary | ICD-10-CM | POA: Diagnosis not present

## 2019-12-14 DIAGNOSIS — Z87442 Personal history of urinary calculi: Secondary | ICD-10-CM

## 2019-12-14 DIAGNOSIS — M1990 Unspecified osteoarthritis, unspecified site: Secondary | ICD-10-CM | POA: Diagnosis present

## 2019-12-14 DIAGNOSIS — I5032 Chronic diastolic (congestive) heart failure: Secondary | ICD-10-CM

## 2019-12-14 DIAGNOSIS — Z7902 Long term (current) use of antithrombotics/antiplatelets: Secondary | ICD-10-CM

## 2019-12-14 DIAGNOSIS — I5031 Acute diastolic (congestive) heart failure: Secondary | ICD-10-CM | POA: Diagnosis not present

## 2019-12-14 DIAGNOSIS — Z20822 Contact with and (suspected) exposure to covid-19: Secondary | ICD-10-CM | POA: Diagnosis present

## 2019-12-14 DIAGNOSIS — I5033 Acute on chronic diastolic (congestive) heart failure: Secondary | ICD-10-CM | POA: Diagnosis present

## 2019-12-14 DIAGNOSIS — Z79899 Other long term (current) drug therapy: Secondary | ICD-10-CM

## 2019-12-14 DIAGNOSIS — Z8546 Personal history of malignant neoplasm of prostate: Secondary | ICD-10-CM

## 2019-12-14 DIAGNOSIS — E1151 Type 2 diabetes mellitus with diabetic peripheral angiopathy without gangrene: Secondary | ICD-10-CM | POA: Diagnosis present

## 2019-12-14 DIAGNOSIS — I110 Hypertensive heart disease with heart failure: Secondary | ICD-10-CM | POA: Diagnosis not present

## 2019-12-14 DIAGNOSIS — I252 Old myocardial infarction: Secondary | ICD-10-CM

## 2019-12-14 DIAGNOSIS — Z881 Allergy status to other antibiotic agents status: Secondary | ICD-10-CM

## 2019-12-14 LAB — BASIC METABOLIC PANEL
Anion gap: 11 (ref 5–15)
BUN: 42 mg/dL — ABNORMAL HIGH (ref 8–23)
CO2: 21 mmol/L — ABNORMAL LOW (ref 22–32)
Calcium: 8.9 mg/dL (ref 8.9–10.3)
Chloride: 105 mmol/L (ref 98–111)
Creatinine, Ser: 1.6 mg/dL — ABNORMAL HIGH (ref 0.61–1.24)
GFR calc Af Amer: 47 mL/min — ABNORMAL LOW (ref >60)
GFR calc non Af Amer: 40 mL/min — ABNORMAL LOW (ref >60)
Glucose, Bld: 290 mg/dL — ABNORMAL HIGH (ref 70–99)
Potassium: 4 mmol/L (ref 3.5–5.1)
Sodium: 137 mmol/L (ref 135–145)

## 2019-12-14 LAB — CBC
HCT: 35.2 % — ABNORMAL LOW (ref 39.0–52.0)
Hemoglobin: 12.2 g/dL — ABNORMAL LOW (ref 13.0–17.0)
MCH: 30.3 pg (ref 26.0–34.0)
MCHC: 34.7 g/dL (ref 30.0–36.0)
MCV: 87.3 fL (ref 80.0–100.0)
Platelets: 227 10*3/uL (ref 150–400)
RBC: 4.03 MIL/uL — ABNORMAL LOW (ref 4.22–5.81)
RDW: 14.4 % (ref 11.5–15.5)
WBC: 10.7 10*3/uL — ABNORMAL HIGH (ref 4.0–10.5)
nRBC: 0 % (ref 0.0–0.2)

## 2019-12-14 LAB — PROTIME-INR
INR: 0.9 (ref 0.8–1.2)
Prothrombin Time: 12.5 seconds (ref 11.4–15.2)

## 2019-12-14 LAB — GLUCOSE, CAPILLARY
Glucose-Capillary: 177 mg/dL — ABNORMAL HIGH (ref 70–99)
Glucose-Capillary: 160 mg/dL — ABNORMAL HIGH (ref 70–99)

## 2019-12-14 LAB — RESPIRATORY PANEL BY RT PCR (FLU A&B, COVID)
Influenza A by PCR: NEGATIVE
Influenza B by PCR: NEGATIVE
SARS Coronavirus 2 by RT PCR: NEGATIVE

## 2019-12-14 LAB — URINE DRUG SCREEN, QUALITATIVE (ARMC ONLY)
Amphetamines, Ur Screen: NOT DETECTED
Barbiturates, Ur Screen: NOT DETECTED
Benzodiazepine, Ur Scrn: NOT DETECTED
Cannabinoid 50 Ng, Ur ~~LOC~~: NOT DETECTED
Cocaine Metabolite,Ur ~~LOC~~: NOT DETECTED
MDMA (Ecstasy)Ur Screen: NOT DETECTED
Methadone Scn, Ur: NOT DETECTED
Opiate, Ur Screen: NOT DETECTED
Phencyclidine (PCP) Ur S: NOT DETECTED
Tricyclic, Ur Screen: NOT DETECTED

## 2019-12-14 LAB — TROPONIN I (HIGH SENSITIVITY)
Troponin I (High Sensitivity): 93 ng/L — ABNORMAL HIGH (ref <18)
Troponin I (High Sensitivity): 125 ng/L (ref <18)
Troponin I (High Sensitivity): 131 ng/L (ref <18)
Troponin I (High Sensitivity): 145 ng/L (ref <18)
Troponin I (High Sensitivity): 149 ng/L (ref <18)

## 2019-12-14 LAB — HEPARIN LEVEL (UNFRACTIONATED): Heparin Unfractionated: 0.26 IU/mL — ABNORMAL LOW (ref 0.30–0.70)

## 2019-12-14 LAB — BRAIN NATRIURETIC PEPTIDE: B Natriuretic Peptide: 364 pg/mL — ABNORMAL HIGH (ref 0.0–100.0)

## 2019-12-14 LAB — APTT: aPTT: 32 seconds (ref 24–36)

## 2019-12-14 MED ORDER — METOPROLOL TARTRATE 50 MG PO TABS
75.0000 mg | ORAL_TABLET | Freq: Two times a day (BID) | ORAL | Status: DC
  Administered 2019-12-14 – 2019-12-17 (×7): 75 mg via ORAL
  Filled 2019-12-14: qty 1.5
  Filled 2019-12-14 (×3): qty 1
  Filled 2019-12-14: qty 1.5
  Filled 2019-12-14 (×2): qty 1

## 2019-12-14 MED ORDER — ONDANSETRON HCL 4 MG/2ML IJ SOLN
4.0000 mg | Freq: Three times a day (TID) | INTRAMUSCULAR | Status: DC | PRN
  Administered 2019-12-14 – 2019-12-17 (×4): 4 mg via INTRAVENOUS
  Filled 2019-12-14 (×4): qty 2

## 2019-12-14 MED ORDER — NITROGLYCERIN 0.4 MG SL SUBL
0.4000 mg | SUBLINGUAL_TABLET | SUBLINGUAL | Status: DC | PRN
  Administered 2019-12-14 (×3): 0.4 mg via SUBLINGUAL
  Filled 2019-12-14: qty 1

## 2019-12-14 MED ORDER — COLCHICINE 0.6 MG PO TABS
1.8000 mg | ORAL_TABLET | Freq: Every day | ORAL | Status: DC | PRN
  Filled 2019-12-14: qty 3

## 2019-12-14 MED ORDER — FOLIC ACID 1 MG PO TABS
500.0000 ug | ORAL_TABLET | Freq: Every day | ORAL | Status: DC
  Administered 2019-12-14 – 2019-12-17 (×4): 0.5 mg via ORAL
  Filled 2019-12-14: qty 0.5
  Filled 2019-12-14 (×3): qty 1

## 2019-12-14 MED ORDER — HEPARIN (PORCINE) 25000 UT/250ML-% IV SOLN
1200.0000 [IU]/h | INTRAVENOUS | Status: DC
  Administered 2019-12-14: 08:00:00 1050 [IU]/h via INTRAVENOUS
  Administered 2019-12-15: 1200 [IU]/h via INTRAVENOUS
  Filled 2019-12-14 (×2): qty 250

## 2019-12-14 MED ORDER — ALLOPURINOL 100 MG PO TABS
100.0000 mg | ORAL_TABLET | Freq: Every day | ORAL | Status: DC
  Administered 2019-12-14 – 2019-12-17 (×4): 100 mg via ORAL
  Filled 2019-12-14 (×4): qty 1

## 2019-12-14 MED ORDER — EPINEPHRINE 0.3 MG/0.3ML IJ SOAJ
0.3000 mg | INTRAMUSCULAR | Status: DC | PRN
  Filled 2019-12-14: qty 0.3

## 2019-12-14 MED ORDER — FLUTICASONE PROPIONATE 50 MCG/ACT NA SUSP
2.0000 | Freq: Every day | NASAL | Status: DC
  Administered 2019-12-15 – 2019-12-17 (×3): 2 via NASAL
  Filled 2019-12-14: qty 16

## 2019-12-14 MED ORDER — ISOSORBIDE MONONITRATE ER 60 MG PO TB24
60.0000 mg | ORAL_TABLET | Freq: Every day | ORAL | Status: DC
  Administered 2019-12-14 – 2019-12-17 (×4): 60 mg via ORAL
  Filled 2019-12-14 (×4): qty 1

## 2019-12-14 MED ORDER — ROSUVASTATIN CALCIUM 10 MG PO TABS
40.0000 mg | ORAL_TABLET | Freq: Every evening | ORAL | Status: DC
  Administered 2019-12-14 – 2019-12-16 (×3): 40 mg via ORAL
  Filled 2019-12-14: qty 4
  Filled 2019-12-14 (×3): qty 2

## 2019-12-14 MED ORDER — HEPARIN BOLUS VIA INFUSION
4000.0000 [IU] | Freq: Once | INTRAVENOUS | Status: AC
  Administered 2019-12-14: 4000 [IU] via INTRAVENOUS
  Filled 2019-12-14: qty 4000

## 2019-12-14 MED ORDER — INSULIN GLARGINE 100 UNIT/ML ~~LOC~~ SOLN
50.0000 [IU] | Freq: Every day | SUBCUTANEOUS | Status: DC
  Administered 2019-12-14 – 2019-12-16 (×3): 50 [IU] via SUBCUTANEOUS
  Filled 2019-12-14 (×4): qty 0.5

## 2019-12-14 MED ORDER — FUROSEMIDE 10 MG/ML IJ SOLN
40.0000 mg | Freq: Once | INTRAMUSCULAR | Status: AC
  Administered 2019-12-14: 40 mg via INTRAVENOUS
  Filled 2019-12-14: qty 4

## 2019-12-14 MED ORDER — HEPARIN BOLUS VIA INFUSION
1200.0000 [IU] | Freq: Once | INTRAVENOUS | Status: AC
  Administered 2019-12-14: 18:00:00 1200 [IU] via INTRAVENOUS
  Filled 2019-12-14: qty 1200

## 2019-12-14 MED ORDER — METHOCARBAMOL 500 MG PO TABS
500.0000 mg | ORAL_TABLET | Freq: Two times a day (BID) | ORAL | Status: DC
  Administered 2019-12-14 – 2019-12-17 (×6): 500 mg via ORAL
  Filled 2019-12-14 (×9): qty 1

## 2019-12-14 MED ORDER — INSULIN ASPART 100 UNIT/ML ~~LOC~~ SOLN
SUBCUTANEOUS | Status: AC
  Administered 2019-12-14: 19:00:00 1 [IU] via SUBCUTANEOUS
  Filled 2019-12-14: qty 1

## 2019-12-14 MED ORDER — LORATADINE 10 MG PO TABS
10.0000 mg | ORAL_TABLET | Freq: Every day | ORAL | Status: DC
  Administered 2019-12-14 – 2019-12-17 (×4): 10 mg via ORAL
  Filled 2019-12-14 (×4): qty 1

## 2019-12-14 MED ORDER — IPRATROPIUM-ALBUTEROL 0.5-2.5 (3) MG/3ML IN SOLN
3.0000 mL | Freq: Four times a day (QID) | RESPIRATORY_TRACT | Status: DC
  Administered 2019-12-15 – 2019-12-17 (×6): 3 mL via RESPIRATORY_TRACT
  Filled 2019-12-14 (×6): qty 3

## 2019-12-14 MED ORDER — PANTOPRAZOLE SODIUM 40 MG PO TBEC
40.0000 mg | DELAYED_RELEASE_TABLET | Freq: Two times a day (BID) | ORAL | Status: DC
  Administered 2019-12-14 – 2019-12-17 (×7): 40 mg via ORAL
  Filled 2019-12-14 (×7): qty 1

## 2019-12-14 MED ORDER — LOSARTAN POTASSIUM 50 MG PO TABS
50.0000 mg | ORAL_TABLET | Freq: Every day | ORAL | Status: DC
  Administered 2019-12-14 – 2019-12-15 (×2): 50 mg via ORAL
  Filled 2019-12-14 (×2): qty 1

## 2019-12-14 MED ORDER — METOPROLOL TARTRATE 5 MG/5ML IV SOLN: 2.5000 mg | INTRAVENOUS | Status: DC | PRN

## 2019-12-14 MED ORDER — ACETAMINOPHEN 325 MG PO TABS
650.0000 mg | ORAL_TABLET | Freq: Four times a day (QID) | ORAL | Status: DC | PRN
  Administered 2019-12-14: 650 mg via ORAL
  Filled 2019-12-14: qty 2

## 2019-12-14 MED ORDER — CLOPIDOGREL BISULFATE 75 MG PO TABS
75.0000 mg | ORAL_TABLET | Freq: Every day | ORAL | Status: DC
  Administered 2019-12-14 – 2019-12-17 (×4): 75 mg via ORAL
  Filled 2019-12-14 (×4): qty 1

## 2019-12-14 MED ORDER — DM-GUAIFENESIN ER 30-600 MG PO TB12
1.0000 | ORAL_TABLET | Freq: Two times a day (BID) | ORAL | Status: DC
  Administered 2019-12-14 – 2019-12-17 (×7): 1 via ORAL
  Filled 2019-12-14 (×11): qty 1

## 2019-12-14 MED ORDER — AMLODIPINE BESYLATE 5 MG PO TABS
5.0000 mg | ORAL_TABLET | Freq: Every day | ORAL | Status: DC
  Administered 2019-12-14 – 2019-12-17 (×4): 5 mg via ORAL
  Filled 2019-12-14 (×4): qty 1

## 2019-12-14 MED ORDER — ASPIRIN EC 81 MG PO TBEC
81.0000 mg | DELAYED_RELEASE_TABLET | Freq: Every day | ORAL | Status: DC
  Administered 2019-12-14 – 2019-12-17 (×4): 81 mg via ORAL
  Filled 2019-12-14 (×4): qty 1

## 2019-12-14 MED ORDER — NITROGLYCERIN 0.4 MG SL SUBL
0.4000 mg | SUBLINGUAL_TABLET | SUBLINGUAL | Status: DC | PRN
  Administered 2019-12-16: 0.4 mg via SUBLINGUAL
  Filled 2019-12-14: qty 1

## 2019-12-14 MED ORDER — FUROSEMIDE 10 MG/ML IJ SOLN
40.0000 mg | Freq: Every day | INTRAMUSCULAR | Status: DC
  Administered 2019-12-16 – 2019-12-17 (×2): 40 mg via INTRAVENOUS
  Filled 2019-12-14 (×2): qty 4

## 2019-12-14 MED ORDER — HYDRALAZINE HCL 25 MG PO TABS
25.0000 mg | ORAL_TABLET | Freq: Three times a day (TID) | ORAL | Status: DC | PRN
  Administered 2019-12-15: 25 mg via ORAL
  Filled 2019-12-14 (×2): qty 1

## 2019-12-14 MED ORDER — ASCORBIC ACID 500 MG PO TABS
250.0000 mg | ORAL_TABLET | Freq: Every day | ORAL | Status: DC
  Administered 2019-12-14 – 2019-12-17 (×4): 250 mg via ORAL
  Filled 2019-12-14 (×2): qty 1
  Filled 2019-12-14: qty 0.5
  Filled 2019-12-14: qty 1

## 2019-12-14 MED ORDER — MOMETASONE FURO-FORMOTEROL FUM 200-5 MCG/ACT IN AERO
2.0000 | INHALATION_SPRAY | Freq: Two times a day (BID) | RESPIRATORY_TRACT | Status: DC
  Administered 2019-12-15 – 2019-12-17 (×5): 2 via RESPIRATORY_TRACT
  Filled 2019-12-14: qty 8.8

## 2019-12-14 MED ORDER — IPRATROPIUM-ALBUTEROL 0.5-2.5 (3) MG/3ML IN SOLN
RESPIRATORY_TRACT | Status: AC
  Filled 2019-12-14: qty 3

## 2019-12-14 MED ORDER — MAGNESIUM OXIDE 400 (241.3 MG) MG PO TABS
400.0000 mg | ORAL_TABLET | Freq: Every day | ORAL | Status: DC
  Administered 2019-12-14 – 2019-12-17 (×4): 400 mg via ORAL
  Filled 2019-12-14 (×4): qty 1

## 2019-12-14 MED ORDER — CITALOPRAM HYDROBROMIDE 20 MG PO TABS
10.0000 mg | ORAL_TABLET | Freq: Every day | ORAL | Status: DC
  Administered 2019-12-14 – 2019-12-17 (×4): 10 mg via ORAL
  Filled 2019-12-14 (×4): qty 1

## 2019-12-14 MED ORDER — INSULIN ASPART 100 UNIT/ML ~~LOC~~ SOLN
0.0000 [IU] | SUBCUTANEOUS | Status: DC
  Administered 2019-12-14: 12:00:00 2 [IU] via SUBCUTANEOUS
  Administered 2019-12-15: 3 [IU] via SUBCUTANEOUS
  Administered 2019-12-15: 2 [IU] via SUBCUTANEOUS
  Administered 2019-12-16: 1 [IU] via SUBCUTANEOUS
  Administered 2019-12-16 (×2): 2 [IU] via SUBCUTANEOUS
  Administered 2019-12-16: 3 [IU] via SUBCUTANEOUS
  Administered 2019-12-16: 2 [IU] via SUBCUTANEOUS
  Administered 2019-12-17: 01:00:00 1 [IU] via SUBCUTANEOUS
  Administered 2019-12-17: 2 [IU] via SUBCUTANEOUS
  Filled 2019-12-14 (×7): qty 1

## 2019-12-14 MED ORDER — OMEGA-3-ACID ETHYL ESTERS 1 G PO CAPS
1.0000 g | ORAL_CAPSULE | Freq: Every day | ORAL | Status: DC
  Administered 2019-12-15 – 2019-12-17 (×3): 1 g via ORAL
  Filled 2019-12-14 (×5): qty 1

## 2019-12-14 MED ORDER — SODIUM CHLORIDE FLUSH 0.9 % IV SOLN
INTRAVENOUS | Status: AC
  Administered 2019-12-14: 22:00:00 10 mL
  Filled 2019-12-14: qty 10

## 2019-12-14 MED ORDER — ZINC SULFATE 220 (50 ZN) MG PO CAPS
220.0000 mg | ORAL_CAPSULE | Freq: Every day | ORAL | Status: DC
  Administered 2019-12-14 – 2019-12-17 (×4): 220 mg via ORAL
  Filled 2019-12-14 (×4): qty 1

## 2019-12-14 MED ORDER — MORPHINE SULFATE (PF) 2 MG/ML IV SOLN
1.0000 mg | INTRAVENOUS | Status: DC | PRN
  Administered 2019-12-14 – 2019-12-17 (×3): 1 mg via INTRAVENOUS
  Filled 2019-12-14 (×3): qty 1

## 2019-12-14 MED ORDER — ALBUTEROL SULFATE (2.5 MG/3ML) 0.083% IN NEBU: 2.5000 mg | INHALATION_SOLUTION | RESPIRATORY_TRACT | Status: DC | PRN

## 2019-12-14 MED ORDER — MONTELUKAST SODIUM 10 MG PO TABS
10.0000 mg | ORAL_TABLET | Freq: Every day | ORAL | Status: DC
  Administered 2019-12-14 – 2019-12-16 (×3): 10 mg via ORAL
  Filled 2019-12-14 (×4): qty 1

## 2019-12-14 MED ORDER — FERROUS SULFATE 325 (65 FE) MG PO TABS
325.0000 mg | ORAL_TABLET | Freq: Every day | ORAL | Status: DC
  Administered 2019-12-14 – 2019-12-17 (×4): 325 mg via ORAL
  Filled 2019-12-14 (×4): qty 1

## 2019-12-14 NOTE — Consult Note (Signed)
CARDIOLOGY CONSULT NOTE               Patient ID: Mike Decker MRN: [id number] DOB/AGE: 1941-08-09 79 y.o.  Admit date: 12/14/2019 Referring Physician Dr. Blaine Hamper hospitalist Primary Physician Dr. Rebecka Apley Primary Cardiologist Dr. Clayborn Bigness Reason for Consultation chest pain possible angina  HPI: Patient is a 79 year old white male multivessel coronary disease previous stent in the past non-STEMI past hypertension hyperlipidemia diabetes COPD previous stroke GERD obesity obstructive sleep apnea peripheral vascular disease congestive heart failure chronic renal sufficiency prostate cancer states his been doing reasonably well the last he was in the hospital he required nitroglycerin which was several months ago patient states he took a few nitroglycerin recently for the last few days with prompted him to come to the emergency room he describes midsternal chest discomfort pressure with some shortness of breath he had symptoms while at rest.  Patient was seen in emergency room .  Patient states that he has no pain now resting comfortably on heparin no shortness of breath  Review of systems complete and found to be negative unless listed above     Past Medical History:  Diagnosis Date  . Anemia   . Arthritis   . Atrioventricular canal (AVC)    irregular heart beats  . Barrett esophagus   . 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 Chronic diastolic CHF (congestive heart failure) ([place])   . Colon polyp   . Diabetes mellitus without complication ([place])   . Diverticulosis   . Gout   . Heart disease   . Hemangioma    liver  . Hyperlipidemia   . Hypertension   . Myocardial infarct ([place])   . Ocular hypertension   . Peripheral vascular disease ([place])   . Skin cancer   . Sleep apnea   . Vitreoretinal degeneration     Past Surgical History:  Procedure Laterality Date  . CATARACT EXTRACTION  2011, 2012  . COLONOSCOPY N/A 12/14/2018   Procedure: COLONOSCOPY;  Surgeon: Virgel Manifold, MD;  Location: [facility] ENDOSCOPY;  Service: Endoscopy;  Laterality: N/A;  . CORONARY ANGIOPLASTY WITH STENT PLACEMENT  2012  . ESOPHAGOGASTRODUODENOSCOPY N/A 12/14/2018   Procedure: ESOPHAGOGASTRODUODENOSCOPY (EGD);  Surgeon: Virgel Manifold, MD;  Location: [facility] ENDOSCOPY;  Service: Endoscopy;  Laterality: N/A;  . ESOPHAGOGASTRODUODENOSCOPY (EGD) WITH PROPOFOL N/A 06/01/2016   Procedure: ESOPHAGOGASTRODUODENOSCOPY (EGD) WITH PROPOFOL;  Surgeon: Manya Silvas, MD;  Location: [facility] ENDOSCOPY;  Service: Endoscopy;  Laterality: N/A;  . HERNIA REPAIR     x2  . INTRAOCULAR LENS INSERTION    . LEFT HEART CATH AND CORONARY ANGIOGRAPHY N/A 05/09/2017   Procedure: Left Heart Cath and Coronary Angiography;  Surgeon: Yolonda Kida, MD;  Location: [place] CV LAB;  Service: Cardiovascular;  Laterality: N/A;  . LEFT HEART CATH AND CORONARY ANGIOGRAPHY N/A 12/08/2018   Procedure: LEFT HEART CATH AND CORONARY ANGIOGRAPHY and possible PCI and stent;  Surgeon: Yolonda Kida, MD;  Location: [place] CV LAB;  Service: Cardiovascular;  Laterality: N/A;  . NOSE SURGERY     submucous resection  . PROSTATE SURGERY  2002  . ROTATOR CUFF REPAIR Right     (Not in a hospital admission)  Social History   Socioeconomic History  . Marital status: Married    Spouse name: Not on file  . Number of children: Not on file  . Years of education: Not on file  . Highest education level: Not on file  Occupational History  .  Not on file  Tobacco Use  . Smoking status: Former Smoker    Years: 20.00    Types: Cigarettes    Quit date: 12/10/1976    Years since quitting: 43.0  . Smokeless tobacco: Never Used  Substance and Sexual Activity  . Alcohol use: No  . Drug use: No  . Sexual activity: Not on file  Other Topics Concern  . Not on file  Social History Narrative  . Not on file   Social Determinants of Health   Financial Resource Strain:   . Difficulty of Paying Living Expenses:  Not on file  Food Insecurity:   . Worried About Charity fundraiser in the Last Year: Not on file  . Ran Out of Food in the Last Year: Not on file  Transportation Needs:   . Lack of Transportation (Medical): Not on file  . Lack of Transportation (Non-Medical): Not on file  Physical Activity:   . Days of Exercise per Week: Not on file  . Minutes of Exercise per Session: Not on file  Stress:   . Feeling of Stress : Not on file  Social Connections:   . Frequency of Communication with Friends and Family: Not on file  . Frequency of Social Gatherings with Friends and Family: Not on file  . Attends Religious Services: Not on file  . Active Member of Clubs or Organizations: Not on file  . Attends Archivist Meetings: Not on file  . Marital Status: Not on file  Intimate Partner Violence:   . Fear of Current or Ex-Partner: Not on file  . Emotionally Abused: Not on file  . Physically Abused: Not on file  . Sexually Abused: Not on file    Family History  Problem Relation Age of Onset  . Arthritis Mother   . Stroke Maternal Grandfather   . Breast cancer Neg Hx       Review of systems complete and found to be negative unless listed above      PHYSICAL EXAM  General: Well developed, well nourished, in no acute distress HEENT:  Normocephalic and atramatic Neck:  No JVD.  Lungs: Clear bilaterally to auscultation and percussion. Heart: HRRR . Normal S1 and S2 without gallops or murmurs.  Abdomen: Bowel sounds are positive, abdomen soft and non-tender  Msk:  Back normal, normal gait. Normal strength and tone for age. Extremities: No clubbing, cyanosis or edema.   Neuro: Alert and oriented X 3. Psych:  Good affect, responds appropriately  Labs:   Lab Results  Component Value Date   WBC 10.7 (H) 12/14/2019   HGB 12.2 (L) 12/14/2019   HCT 35.2 (L) 12/14/2019   MCV 87.3 12/14/2019   PLT 227 12/14/2019    Recent Labs  Lab 12/14/19 0233  NA 137  K 4.0  CL 105  CO2  21*  BUN 42*  CREATININE 1.60*  CALCIUM 8.9  GLUCOSE 290*   Lab Results  Component Value Date   TROPONINI 0.03 (HH) 12/12/2018    Lab Results  Component Value Date   CHOL 112 11/15/2018   CHOL 92 05/07/2017   CHOL 189 10/08/2015   Lab Results  Component Value Date   HDL 27 (L) 11/15/2018   HDL 23 (L) 05/07/2017   HDL 30 (L) 10/08/2015   Lab Results  Component Value Date   LDLCALC 51 11/15/2018   LDLCALC 35 05/07/2017   LDLCALC UNABLE TO CALCULATE IF TRIGLYCERIDE OVER 400 mg/dL 10/08/2015   Lab Results  Component Value Date   TRIG 171 (H) 11/15/2018   TRIG 168 (H) 05/07/2017   TRIG 610 (H) 10/08/2015   Lab Results  Component Value Date   CHOLHDL 4.1 11/15/2018   CHOLHDL 4.0 05/07/2017   CHOLHDL 6.3 10/08/2015   No results found for: LDLDIRECT    Radiology: DG Chest 2 View  Result Date: 12/14/2019 CLINICAL DATA:  Chest pain EXAM: CHEST - 2 VIEW COMPARISON:  11/24/2019, 06/10/2019 FINDINGS: No consolidation or pleural effusion. Mild coarse bronchitic changes. Stable cardiomediastinal silhouette with borderline cardiomegaly. No pneumothorax. IMPRESSION: Low lung volumes with coarse bronchitic changes. No consolidative airspace disease. Borderline to mild cardiomegaly. Electronically Signed   By: Donavan Foil M.D.   On: 12/14/2019 03:18   DG Chest 2 View  Result Date: 11/25/2019 CLINICAL DATA:  Cough, wheezing EXAM: CHEST - 2 VIEW COMPARISON:  06/10/2019 FINDINGS: The heart size and mediastinal contours are within normal limits. Both lungs are clear. No pleural effusion. The visualized skeletal structures are unremarkable. IMPRESSION: No acute process in the chest. Electronically Signed   By: Macy Mis M.D.   On: 11/25/2019 08:46    EKG: Normal sinus rhythm nonspecific ST-T wave changes  ASSESSMENT AND PLAN:  Angina Chest pain Hypertension Elevated troponin Hyperlipidemia Coronary artery disease Peripheral vascular disease Chronic renal  insufficiency Obstructive sleep apnea Diabetes type 2 Obesity . Plan Agree with admit to telemetry Ruled out for myocardial infarction follow-up EKGs and troponin Recommend anticoagulation with heparin for 24 to 48 hours Continue diabetes management and control Inhalers for COPD Continue nitrates with Imdur consider adding Ranexa Advance Cozaar from 25-50 consider switching to Entresto Continue beta-blockade therapy for hypertension as well as angina Continue hypertension management and control Maintain Protonix for reflux type symptoms and Barrett's esophagitis At this point recommend conservative cardiac input  Signed: Yolonda Kida MD 12/14/2019, 9:44 AM

## 2019-12-14 NOTE — Progress Notes (Signed)
Mike Decker for heparin Indication: chest pain/ACS  Allergies  Allergen Reactions  . Gabapentin     Other reaction(s): Other (See Comments) Tremors  . Peanut-Containing Drug Products Anaphylaxis  . Penicillins Hives and Rash    Has patient had a PCN reaction causing immediate rash, facial/tongue/throat swelling, SOB or lightheadedness with hypotension: Yes Has patient had a PCN reaction causing severe rash involving mucus membranes or skin necrosis: No Has patient had a PCN reaction that required hospitalization No Has patient had a PCN reaction occurring within the last 10 years: No If all of the above answers are "NO", then may proceed with Cephalosporin use.  . Bee Venom Swelling  . Influenza Vaccines Hives  . Inh [Isoniazid] Hives  . Kenalog [Triamcinolone Acetonide] Hives  . Levaquin [Levofloxacin] Other (See Comments)    Tendon, ligament pain.   . Nalfon [Fenoprofen Calcium] Hives  . Naproxen   . Peanut Oil   . Nsaids Rash    Nalfon 600 Nalfon 600    Patient Measurements: Height: 5\' 6"  (167.6 cm) Weight: 206 lb (93.4 kg) IBW/kg (Calculated) : 63.8 Heparin Dosing Weight: 83.9 kg  Vital Signs: Temp: 97.5 F (36.4 C) (01/04 1550) Temp Source: Temporal (01/04 1550) BP: 101/66 (01/04 1616) Pulse Rate: 79 (01/04 1550)  Labs: Recent Labs    12/14/19 0233 12/14/19 0752 12/14/19 0853 12/14/19 1127 12/14/19 1414 12/14/19 1619  HGB 12.2*  --   --   --   --   --   HCT 35.2*  --   --   --   --   --   PLT 227  --   --   --   --   --   APTT  --  32  --   --   --   --   LABPROT  --  12.5  --   --   --   --   INR  --  0.9  --   --   --   --   HEPARINUNFRC  --   --   --   --   --  0.26*  CREATININE 1.60*  --   --   --   --   --   TROPONINIHS 93*  --  131* 145* 149*  --     Estimated Creatinine Clearance: 40 mL/min (A) (by C-G formula based on SCr of 1.6 mg/dL (H)).   Medical History: Past Medical History:  Diagnosis Date  .  Anemia   . Arthritis   . Atrioventricular canal (AVC)    irregular heart beats  . Barrett esophagus   . 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 Chronic diastolic CHF (congestive heart failure) ([place])   . Colon polyp   . Diabetes mellitus without complication ([place])   . Diverticulosis   . Gout   . Heart disease   . Hemangioma    liver  . Hyperlipidemia   . Hypertension   . Myocardial infarct ([place])   . Ocular hypertension   . Peripheral vascular disease ([place])   . Skin cancer   . Sleep apnea   . Vitreoretinal degeneration     Assessment: Patient arrives w/ CP w/ initial trops of 93 >> 125, EKG w/ nonspecific intraventricular conduction delay. No anticoagulation PTA. Patient is being started on heparin drip for NSTEMI. Baseline CBC WNL for patient.  Cardiology recommending 24 to 48 hours of heparin.  Goal of Therapy:  Heparin level 0.3-0.7  units/ml Monitor platelets by anticoagulation protocol: Yes   Plan:  1/4 1619 HL 0.26 subtherapeutic. Heparin 1200 unit bolus followed by increase in heparin drip to 1200 units/hr. HL 1/5 at 0200. CBC with morning labs.  Dorena Bodo, PharmD Clinical Pharmacist 12/14/2019,6:35 PM

## 2019-12-14 NOTE — ED Notes (Signed)
Verbal order for cardiac diet by Dr. Blaine Hamper

## 2019-12-14 NOTE — ED Provider Notes (Signed)
[facility] Emergency Department Provider Note  ____________________________________________   First MD Initiated Contact with Patient 12/14/19 [phone]     (approximate)  I have reviewed the triage vital signs and the nursing notes.   HISTORY  Chief Complaint Chest Pain    HPI Mike Decker is a 79 y.o. male  Here with chest pain. Pt states that earlier tonight, he began to develop an aching left arm pain followed by a substernal chest pressure. This was followed by a sensation of SOB with mild nausea. No vomiting. His sx feel similar to his MI in the past. He took nitro x 3 with resolution of his pressure but due to the severity, presents for evaluation. He states the other associated sx he has is swelling of b/l legs. No recent fever, chills. He's had a mild dry cough.         Past Medical History:  Diagnosis Date  . Anemia   . Arthritis   . Atrioventricular canal (AVC)    irregular heart beats  . Barrett esophagus   . 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 Chronic diastolic CHF (congestive heart failure) ([place])   . Colon polyp   . Diabetes mellitus without complication ([place])   . Diverticulosis   . Gout   . Heart disease   . Hemangioma    liver  . Hyperlipidemia   . Hypertension   . Myocardial infarct ([place])   . Ocular hypertension   . Peripheral vascular disease ([place])   . Skin cancer   . Sleep apnea   . Vitreoretinal degeneration     Patient Active Problem List   Diagnosis Date Noted  . DM type 2 with diabetic peripheral neuropathy ([place]) 01/25/2019  . CKD (chronic kidney disease) stage 4, GFR 15-29 ml/min (HCC) 01/21/2019  . Gout 01/21/2019  . MR (mitral regurgitation) 01/21/2019  . COPD (chronic obstructive pulmonary disease) ([place]) 01/08/2019  . OSA (obstructive sleep apnea) 01/08/2019  . Chronic diastolic heart failure ([place]) 12/30/2018  . Polyp of sigmoid colon   . Benign neoplasm of ascending colon   . Hemorrhoids without  complication   . Diverticulosis of large intestine without diverticulitis   . Reflux esophagitis   . Lymphangiectasia   . Arteriovenous malformation of duodenum   . Symptomatic anemia   . SOB (shortness of breath)   . Iron deficiency anemia due to chronic blood loss   . Unstable angina ([place])   . Anemia of chronic renal failure, stage 4 (severe) ([place])   . Chest pain 11/14/2018  . Sciatica 11/12/2018  . CAD (coronary artery disease) 02/27/2018  . Sacroiliac joint pain 10/08/2017  . Pain in limb 09/24/2017  . Neuropathy 07/24/2017  . NSTEMI (non-ST elevated myocardial infarction) ([place]) 05/06/2017  . PAD (peripheral artery disease) ([place]) 05/01/2017  . History of stroke 01/02/2017  . Barrett's esophagus 10/02/2016  . Carotid stenosis 10/02/2016  . Essential hypertension 10/02/2016  . Hyperlipidemia 10/02/2016  . Diabetes ([place]) 10/02/2016  . 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 Uncontrolled type 2 diabetes mellitus with hyperglycemia, with long-term current use of insulin ([place]) 04/10/2016  . TIA (transient ischemic attack) 10/08/2015    Past Surgical History:  Procedure Laterality Date  . CATARACT EXTRACTION  2011, 2012  . COLONOSCOPY N/A 12/14/2018   Procedure: COLONOSCOPY;  Surgeon: Virgel Manifold, MD;  Location: [facility] ENDOSCOPY;  Service: Endoscopy;  Laterality: N/A;  . CORONARY ANGIOPLASTY WITH STENT PLACEMENT  2012  . ESOPHAGOGASTRODUODENOSCOPY N/A 12/14/2018   Procedure: ESOPHAGOGASTRODUODENOSCOPY (EGD);  Surgeon: Virgel Manifold, MD;  Location: [facility] ENDOSCOPY;  Service: Endoscopy;  Laterality: N/A;  . ESOPHAGOGASTRODUODENOSCOPY (EGD) WITH PROPOFOL N/A 06/01/2016   Procedure: ESOPHAGOGASTRODUODENOSCOPY (EGD) WITH PROPOFOL;  Surgeon: Manya Silvas, MD;  Location: [facility] ENDOSCOPY;  Service: Endoscopy;  Laterality: N/A;  . HERNIA REPAIR     x2  . INTRAOCULAR LENS INSERTION    . LEFT HEART CATH AND CORONARY ANGIOGRAPHY N/A 05/09/2017    Procedure: Left Heart Cath and Coronary Angiography;  Surgeon: Yolonda Kida, MD;  Location: [place] CV LAB;  Service: Cardiovascular;  Laterality: N/A;  . LEFT HEART CATH AND CORONARY ANGIOGRAPHY N/A 12/08/2018   Procedure: LEFT HEART CATH AND CORONARY ANGIOGRAPHY and possible PCI and stent;  Surgeon: Yolonda Kida, MD;  Location: [place] CV LAB;  Service: Cardiovascular;  Laterality: N/A;  . NOSE SURGERY     submucous resection  . PROSTATE SURGERY  2002  . ROTATOR CUFF REPAIR Right     Prior to Admission medications   Medication Sig Start Date End Date Taking? Authorizing Provider  acetaminophen (TYLENOL) 500 MG tablet Take 1,000 mg by mouth every 6 (six) hours as needed for mild pain, moderate pain or headache.    [provider]  allopurinol (ZYLOPRIM) 100 MG tablet Take 200 mg by mouth 2 (two) times daily.     [provider]  amLODipine (NORVASC) 5 MG tablet Take by mouth. 12/31/18   [provider]  budesonide-formoterol (SYMBICORT) 160-4.5 MCG/ACT inhaler Inhale 2 puffs into the lungs 2 (two) times daily.    [provider]  cetirizine (ZYRTEC) 5 MG tablet Take 5 mg by mouth daily as needed for allergies or rhinitis.     [provider]  citalopram (CELEXA) 10 MG tablet Take 10 mg by mouth daily.    [provider]  clopidogrel (PLAVIX) 75 MG tablet Take 1 tablet (75 mg total) by mouth daily. 10/09/15   Bettey Costa, MD  colchicine 0.6 MG tablet Take 1 tablet (0.6 mg total) by mouth daily. Patient taking differently: Take 1.8 mg by mouth daily as needed (gout flares).  11/17/18   Bettey Costa, MD  dextromethorphan-guaiFENesin (MUCINEX DM) 30-600 MG 12hr tablet Take 1 tablet by mouth 2 (two) times daily as needed for cough.    [provider]  Dulaglutide (TRULICITY) 1.5 MC/8.0EM SOPN Trulicity 1.5 VV/6.1 mL subcutaneous pen injector  INJECT 1.5 MG SUBCUTANEOUSLY EVERY 7 (SEVEN) DAYS 01/21/19   [provider]  EPINEPHrine (EPI-PEN) 0.3 mg/0.3 mL SOAJ injection Inject into the muscle as needed (anaphylaxis).     [provider]  ferrous sulfate 325 (65 FE) MG EC tablet Take 1 tablet (325 mg total) by mouth daily. 11/10/19   Earlie Server, MD  fluticasone (FLONASE) 50 MCG/ACT nasal spray Place 2 sprays into both nostrils daily.  11/04/18   [provider]  folic acid (V-R FOLIC ACID) 224 MCG tablet Take 1 tablet (400 mcg total) by mouth daily. 12/30/18   Earlie Server, MD  furosemide (LASIX) 20 MG tablet Take 2 tablets (40 mg total) by mouth daily. 11/17/18   Bettey Costa, MD  Icosapent Ethyl 1 g CAPS Take by mouth. 01/27/19   [provider]  insulin aspart (NOVOLOG FLEXPEN) 100 UNIT/ML FlexPen Inject 20-32 Units into the skin See admin instructions. 20-32 units THREE daily as directed    [provider]  insulin glargine (LANTUS) 100 UNIT/ML  injection Inject 70 Units into the skin at bedtime.     [provider]  ipratropium-albuterol (DUONEB) 0.5-2.5 (3) MG/3ML SOLN Take 3 mLs by nebulization every 4 (four) hours as needed. Patient not taking: Reported on 11/10/2019 05/15/17   Hillary Bow, MD  isosorbide mononitrate (IMDUR) 60 MG 24 hr tablet Take by mouth. 01/28/19   [provider]  losartan (COZAAR) 25 MG tablet Take by mouth. 01/19/19   [provider]  magnesium gluconate (MAGONATE) 500 MG tablet Take 500 mg by mouth 2 (two) times daily.     [provider]  methocarbamol (ROBAXIN) 500 MG tablet Take 500 mg by mouth 2 (two) times daily.  11/08/18   [provider]  metoprolol tartrate (LOPRESSOR) 50 MG tablet Take 75 mg by mouth 2 (two) times daily.     [provider]  montelukast (SINGULAIR) 10 MG tablet TAKE 1 TABLET BY MOUTH EVERYDAY AT BEDTIME 11/18/19   [place]ar[place], Audie Clear, NP  nitroGLYCERIN (NITROSTAT) 0.4 MG SL tablet Place 0.4 mg under the tongue every 5 (five) minutes x 3 doses as needed for chest pain.      [provider]  Omega-3 Fatty Acids (FISH OIL) 1000 MG CAPS Take 1 capsule by mouth daily.     [provider]  pantoprazole (PROTONIX) 40 MG tablet Take 40 mg by mouth 2 (two) times daily.    [provider]  rosuvastatin (CRESTOR) 40 MG tablet Take 40 mg by mouth every evening.     [provider]  Vitamin D, Ergocalciferol, (DRISDOL) 50000 units CAPS capsule Take 50,000 Units by mouth every 30 (thirty) days.     [provider]    Allergies Gabapentin, Peanut-containing drug products, Penicillins, Bee venom, Influenza vaccines, Inh [isoniazid], Kenalog [triamcinolone acetonide], Levaquin [levofloxacin], Nalfon [fenoprofen calcium], Naproxen, Peanut oil, and Nsaids  Family History  Problem Relation Age of Onset  . Arthritis Mother   . Stroke Maternal Grandfather   . Breast cancer Neg Hx     Social History Social History   Tobacco Use  . Smoking status: Former Smoker    Years: 20.00    Types: Cigarettes    Quit date: 12/10/1976    Years since quitting: 43.0  . Smokeless tobacco: Never Used  Substance Use Topics  . Alcohol use: No  . Drug use: No    Review of Systems  Review of Systems  Constitutional: Positive for fatigue. Negative for chills and fever.  HENT: Negative for sore throat.   Respiratory: Positive for cough, chest tightness and shortness of breath.   Cardiovascular: Positive for leg swelling. Negative for chest pain.  Gastrointestinal: Negative for abdominal pain.  Genitourinary: Negative for flank pain.  Musculoskeletal: Negative for neck pain.  Skin: Negative for rash and wound.  Allergic/Immunologic: Negative for immunocompromised state.  Neurological: Negative for weakness and numbness.  Hematological: Does not bruise/bleed easily.  All other systems reviewed and are negative.    ____________________________________________  PHYSICAL EXAM:      VITAL SIGNS: ED Triage Vitals  Enc Vitals Group     BP  12/14/19 0231 (!) 189/77     Pulse Rate 12/14/19 0231 73     Resp 12/14/19 0231 (!) 22     Temp 12/14/19 0231 99 F (37.2 C)     Temp Source 12/14/19 0231 Oral     SpO2 12/14/19 0231 96 %     Weight 12/14/19 0235 206 lb (93.4 kg)     Height 12/14/19  0235 5\' 6"  (1.676 m)     Head Circumference --      Peak Flow --      Pain Score 12/14/19 0233 7     Pain Loc --      Pain Edu? --      Excl. in [place]? --      Physical Exam Vitals and nursing note reviewed.  Constitutional:      General: He is not in acute distress.    Appearance: He is well-developed.  HENT:     Head: Normocephalic and atraumatic.  Eyes:     Conjunctiva/sclera: Conjunctivae normal.  Cardiovascular:     Rate and Rhythm: Normal rate and regular rhythm.     Heart sounds: Normal heart sounds. No murmur. No friction rub.  Pulmonary:     Effort: Pulmonary effort is normal. Tachypnea present. No respiratory distress.     Breath sounds: Examination of the right-lower field reveals rales. Examination of the left-lower field reveals rales. Rales present. No wheezing.  Abdominal:     General: There is no distension.     Palpations: Abdomen is soft.     Tenderness: There is no abdominal tenderness.  Musculoskeletal:     Cervical back: Neck supple.  Skin:    General: Skin is warm.     Capillary Refill: Capillary refill takes less than 2 seconds.  Neurological:     Mental Status: He is alert and oriented to person, place, and time.     Motor: No abnormal muscle tone.       ____________________________________________   LABS (all labs ordered are listed, but only abnormal results are displayed)  Labs Reviewed  BASIC METABOLIC PANEL - Abnormal; Notable for the following components:      Result Value   CO2 21 (*)    Glucose, Bld 290 (*)    BUN 42 (*)    Creatinine, Ser 1.60 (*)    GFR calc non Af Amer 40 (*)    GFR calc Af Amer 47 (*)    All other components within normal limits  CBC - Abnormal; Notable for  the following components:   WBC 10.7 (*)    RBC 4.03 (*)    Hemoglobin 12.2 (*)    HCT 35.2 (*)    All other components within normal limits  TROPONIN I (HIGH SENSITIVITY) - Abnormal; Notable for the following components:   Troponin I (High Sensitivity) 93 (*)    All other components within normal limits  TROPONIN I (HIGH SENSITIVITY) - Abnormal; Notable for the following components:   Troponin I (High Sensitivity) 125 (*)    All other components within normal limits  RESPIRATORY PANEL BY RT PCR (FLU A&B, COVID)  PROTIME-INR  BRAIN NATRIURETIC PEPTIDE  APTT  HEPARIN LEVEL (UNFRACTIONATED)    ____________________________________________  EKG: Normal sinus rhythm, VR 84. QRS 120, QTc 483. No acute St elevations or depressions. ________________________________________  RADIOLOGY All imaging, including plain films, CT scans, and ultrasounds, independently reviewed by me, and interpretations confirmed via formal radiology reads.  ED MD interpretation:   CXR: No PNA, mild interstitial lung disease, cardiomegaly  Official radiology report(s): DG Chest 2 View  Result Date: 12/14/2019 CLINICAL DATA:  Chest pain EXAM: CHEST - 2 VIEW COMPARISON:  11/24/2019, 06/10/2019 FINDINGS: No consolidation or pleural effusion. Mild coarse bronchitic changes. Stable cardiomediastinal silhouette with borderline cardiomegaly. No pneumothorax. IMPRESSION: Low lung volumes with coarse bronchitic changes. No consolidative airspace disease. Borderline to mild cardiomegaly. Electronically Signed  By: Donavan Foil M.D.   On: 12/14/2019 03:18    ____________________________________________  PROCEDURES   Procedure(s) performed (including Critical Care):  .Critical Care Performed by: Duffy Bruce, MD Authorized by: Duffy Bruce, MD   Critical care provider statement:    Critical care time (minutes):  35   Critical care time was exclusive of:  Separately billable procedures and treating other  patients and teaching time   Critical care was necessary to treat or prevent imminent or life-threatening deterioration of the following conditions:  Cardiac failure, circulatory failure and respiratory failure   Critical care was time spent personally by me on the following activities:  Development of treatment plan with patient or surrogate, discussions with consultants, evaluation of patient's response to treatment, examination of patient, obtaining history from patient or surrogate, ordering and performing treatments and interventions, ordering and review of laboratory studies, ordering and review of radiographic studies, pulse oximetry, re-evaluation of patient's condition and review of old charts   I assumed direction of critical care for this patient from another provider in my specialty: no      ____________________________________________  INITIAL IMPRESSION / MDM / [place] / ED COURSE  As part of my medical decision making, I reviewed the following data within the [place] notes reviewed and incorporated, Old chart reviewed, Notes from prior ED visits, and [place] Controlled Substance Database       *Mike Decker was evaluated in Emergency Department on 12/14/2019 for the symptoms described in the history of present illness. He was evaluated in the context of the global COVID-19 pandemic, which necessitated consideration that the patient might be at risk for infection with the SARS-CoV-2 virus that causes COVID-19. Institutional protocols and algorithms that pertain to the evaluation of patients at risk for COVID-19 are in a state of rapid change based on information released by regulatory bodies including the CDC and federal and state organizations. These policies and algorithms were followed during the patient's care in the ED.  Some ED evaluations and interventions may be delayed as a result of limited staffing during the pandemic.*     Medical  Decision Making:  79 yo M here with chest pressure, SOB. Exam is concerning for mild CHF with LE edema, bilateral rales. EKG shows no acute changes, though trop uptrending. Will start on heparin, lasix, admit. Discussed with Dr. Clayborn Bigness who will see pt, appreciate recommendations. He has an allergy to ASA.   ____________________________________________  FINAL CLINICAL IMPRESSION(S) / ED DIAGNOSES  Final diagnoses:  Acute diastolic congestive heart failure (HCC)  Elevated troponin     MEDICATIONS GIVEN DURING THIS VISIT:  Medications  furosemide (LASIX) injection 40 mg (has no administration in time range)  heparin bolus via infusion 4,000 Units (has no administration in time range)  heparin ADULT infusion 100 units/mL (25000 units/233mL sodium chloride 0.45%) (has no administration in time range)     ED Discharge Orders    None       Note:  This document was prepared using Dragon voice recognition software and may include unintentional dictation errors.   Duffy Bruce, MD 12/14/19 [phone]

## 2019-12-14 NOTE — ED Notes (Signed)
Report given to [facility] in same day surgery

## 2019-12-14 NOTE — Progress Notes (Signed)
ANTICOAGULATION CONSULT NOTE - Initial Consult  Pharmacy Consult for heparin Indication: chest pain/ACS  Allergies  Allergen Reactions  . Gabapentin     Other reaction(s): Other (See Comments) Tremors  . Peanut-Containing Drug Products Anaphylaxis  . Penicillins Hives and Rash    Has patient had a PCN reaction causing immediate rash, facial/tongue/throat swelling, SOB or lightheadedness with hypotension: Yes Has patient had a PCN reaction causing severe rash involving mucus membranes or skin necrosis: No Has patient had a PCN reaction that required hospitalization No Has patient had a PCN reaction occurring within the last 10 years: No If all of the above answers are "NO", then may proceed with Cephalosporin use.  . Bee Venom Swelling  . Influenza Vaccines Hives  . Inh [Isoniazid] Hives  . Kenalog [Triamcinolone Acetonide] Hives  . Levaquin [Levofloxacin] Other (See Comments)    Tendon, ligament pain.   . Nalfon [Fenoprofen Calcium] Hives  . Naproxen   . Peanut Oil   . Nsaids Rash    Nalfon 600 Nalfon 600    Patient Measurements: Height: 5\' 6"  (167.6 cm) Weight: 206 lb (93.4 kg) IBW/kg (Calculated) : 63.8 Heparin Dosing Weight: 75.6 kg  Vital Signs: Temp: 99 F (37.2 C) (01/04 0231) Temp Source: Oral (01/04 0231) BP: 187/72 (01/04 0703) Pulse Rate: 80 (01/04 0703)  Labs: Recent Labs    12/14/19 0233 12/14/19 0522  HGB 12.2*  --   HCT 35.2*  --   PLT 227  --   CREATININE 1.60*  --   TROPONINIHS 93* 125*    Estimated Creatinine Clearance: 40 mL/min (A) (by C-G formula based on SCr of 1.6 mg/dL (H)).   Medical History: Past Medical History:  Diagnosis Date  . Anemia   . Arthritis   . Atrioventricular canal (AVC)    irregular heart beats  . Barrett esophagus   . 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 Chronic diastolic CHF (congestive heart failure) ([place])   . Colon polyp   . Diabetes mellitus without complication ([place])   . Diverticulosis   .  Gout   . Heart disease   . Hemangioma    liver  . Hyperlipidemia   . Hypertension   . Myocardial infarct ([place])   . Ocular hypertension   . Peripheral vascular disease ([place])   . Skin cancer   . Sleep apnea   . Vitreoretinal degeneration     Medications:  Scheduled:  . furosemide  40 mg Intravenous Once  . heparin  4,000 Units Intravenous Once    Assessment: Patient arrives w/ CP w/ initial trops of 93 >> 125, EKG w/ nonspecific intraventricular conduction delay. No anticoagulation PTA. Patient is being started on heparin drip for NSTEMI. Baseline CBC WNL for patient, aPTT, INR pending.  Goal of Therapy:  Heparin level 0.3-0.7 units/ml Monitor platelets by anticoagulation protocol: Yes   Plan:  Will bolus heparin 4000 units IV x 1 Will start rate at 1050 units/hr. Will check anti-Xa @ 1500 Will continue to monitor daily CBC's and adjust per anti-Xa levels.  Tobie Lords, PharmD, BCPS Clinical Pharmacist 12/14/2019,7:26 AM

## 2019-12-14 NOTE — Progress Notes (Signed)
Report given to sharon rn

## 2019-12-14 NOTE — ED Notes (Signed)
Pt offered lunch tray but pt reports he is too nauseous to eat, pt has prn zofran ordered, given to pt.

## 2019-12-14 NOTE — Progress Notes (Signed)
Went in to get EKG for patient. Sinus Rhythm on monitor. Dr Clayborn Bigness at bedside. No ekg at this time. If patient returns to a-fib get EKG.

## 2019-12-14 NOTE — Progress Notes (Signed)
*  PRELIMINARY RESULTS* Echocardiogram 2D Echocardiogram has been performed.  Mike Decker 12/14/2019, 2:08 PM

## 2019-12-14 NOTE — ED Notes (Signed)
Dr. Blaine Hamper informed of pt's rising HR, currently 128, prn metoprolol ordered, pt just received PO metoprolol before order was placed. Per Dr. Blaine Hamper monitor HR before administering IV metoprolol

## 2019-12-14 NOTE — H&P (Addendum)
History and Physical    JOHNOTHAN Decker MVE:[id number] DOB: 1941-08-09 DOA: 12/14/2019  Referring MD/NP/PA:   PCP: Cletis Athens, MD   Patient coming from:  The patient is coming from home.  At baseline, pt is independent for most of ADL.        Chief Complaint: chest pain  HPI: Mike Decker is a 79 y.o. male with medical history significant of CAD with stent placement, HTN, HLD, DM, COPD, stroke, GERD, gout, depression, OSA, PVD, CHF, prostate cancer, anemia, CKD-4, who presents with chest pain.  Pt states that his chest pain started in the early morning at about 1 AM.  It is located in substernal area, pressure-like, moderate, radiating to the left arm, associated with shortness of breath and nausea. He states that his symptoms are similar to his MI in the past. He took nitro x 3 with significant improvement, currently his chest pain is mild.  Patient has a dry cough, mild shortness of breath, no fever or chills.  Patient denies nausea, vomiting, diarrhea, abdominal pain, symptoms of UTI or unilateral weakness. He also has bilateral lower leg edema.   ED Course: pt was found to have troponin 93, 125, WBC 10.7, pending RVP PCR for Covid, pending BNP, stable renal function, temperature 99, blood pressure 187/72, heart rate 80, oxygen saturation 97% on room air.  Chest x-ray showed course of bronchitis change without infiltration.  Patient is placed on MedSurg bed for observation.  Cardiology, Dr. Clayborn Bigness was consulted.  Review of Systems:   General: no fevers, chills, no body weight gain, has fatigue HEENT: no blurry vision, hearing changes or sore throat Respiratory: has dyspnea, coughing, no wheezing CV: has chest pain, no palpitations GI: has nausea, no vomiting, abdominal pain, diarrhea, constipation GU: no dysuria, burning on urination, increased urinary frequency, hematuria  Ext: has leg edema Neuro: no unilateral weakness, numbness, or tingling, no vision change or hearing  loss Skin: no rash, no skin tear. MSK: No muscle spasm, no deformity, no limitation of range of movement in spin Heme: No easy bruising.  Travel history: No recent long distant travel.  Allergy:  Allergies  Allergen Reactions  . Gabapentin     Other reaction(s): Other (See Comments) Tremors  . Peanut-Containing Drug Products Anaphylaxis  . Penicillins Hives and Rash    Has patient had a PCN reaction causing immediate rash, facial/tongue/throat swelling, SOB or lightheadedness with hypotension: Yes Has patient had a PCN reaction causing severe rash involving mucus membranes or skin necrosis: No Has patient had a PCN reaction that required hospitalization No Has patient had a PCN reaction occurring within the last 10 years: No If all of the above answers are "NO", then may proceed with Cephalosporin use.  . Bee Venom Swelling  . Influenza Vaccines Hives  . Inh [Isoniazid] Hives  . Kenalog [Triamcinolone Acetonide] Hives  . Levaquin [Levofloxacin] Other (See Comments)    Tendon, ligament pain.   . Nalfon [Fenoprofen Calcium] Hives  . Naproxen   . Peanut Oil   . Nsaids Rash    Nalfon 600 Nalfon 600    Past Medical History:  Diagnosis Date  . Anemia   . Arthritis   . Atrioventricular canal (AVC)    irregular heart beats  . Barrett esophagus   . 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 Chronic diastolic CHF (congestive heart failure) ([place])   . Colon polyp   . Diabetes mellitus without complication ([place])   . Diverticulosis   .  Gout   . Heart disease   . Hemangioma    liver  . Hyperlipidemia   . Hypertension   . Myocardial infarct ([place])   . Ocular hypertension   . Peripheral vascular disease ([place])   . Skin cancer   . Sleep apnea   . Vitreoretinal degeneration     Past Surgical History:  Procedure Laterality Date  . CATARACT EXTRACTION  2011, 2012  . COLONOSCOPY N/A 12/14/2018   Procedure: COLONOSCOPY;  Surgeon: Virgel Manifold, MD;  Location: [facility]  ENDOSCOPY;  Service: Endoscopy;  Laterality: N/A;  . CORONARY ANGIOPLASTY WITH STENT PLACEMENT  2012  . ESOPHAGOGASTRODUODENOSCOPY N/A 12/14/2018   Procedure: ESOPHAGOGASTRODUODENOSCOPY (EGD);  Surgeon: Virgel Manifold, MD;  Location[facility] ENDOSCOPY;  Service: Endoscopy;  Laterality: N/A;  . ESOPHAGOGASTRODUODENOSCOPY (EGD) WITH PROPOFOL N/A 06/01/2016   Procedure: ESOPHAGOGASTRODUODENOSCOPY (EGD) WITH PROPOFOL;  Surgeon: Manya Silvas, MD;  Location: [facility] ENDOSCOPY;  Service: Endoscopy;  Laterality: N/A;  . HERNIA REPAIR     x2  . INTRAOCULAR LENS INSERTION    . LEFT HEART CATH AND CORONARY ANGIOGRAPHY N/A 05/09/2017   Procedure: Left Heart Cath and Coronary Angiography;  Surgeon: Yolonda Kida, MD;  Location: [facility] CV LAB;  Service: Cardiovascular;  Laterality: N/A;  . LEFT HEART CATH AND CORONARY ANGIOGRAPHY N/A 12/08/2018   Procedure: LEFT HEART CATH AND CORONARY ANGIOGRAPHY and possible PCI and stent;  Surgeon: Yolonda Kida, MD;  Location: [facility] CV LAB;  Service: Cardiovascular;  Laterality: N/A;  . NOSE SURGERY     submucous resection  . PROSTATE SURGERY  2002  . ROTATOR CUFF REPAIR Right     Social History:  reports that he quit smoking about 43 years ago. His smoking use included cigarettes. He quit after 20.00 years of use. He has never used smokeless tobacco. He reports that he does not drink alcohol or use drugs.  Family History:  Family History  Problem Relation Age of Onset  . Arthritis Mother   . Stroke Maternal Grandfather   . Breast cancer Neg Hx      Prior to Admission medications   Medication Sig Start Date End Date Taking? Authorizing Provider  acetaminophen (TYLENOL) 500 MG tablet Take 1,000 mg by mouth every 6 (six) hours as needed for mild pain, moderate pain or headache.    [provider]  allopurinol (ZYLOPRIM) 100 MG tablet Take 200 mg by mouth 2 (two) times daily.     [provider]  amLODipine (NORVASC) 5  MG tablet Take by mouth. 12/31/18   [provider]  budesonide-formoterol (SYMBICORT) 160-4.5 MCG/ACT inhaler Inhale 2 puffs into the lungs 2 (two) times daily.    [provider]  cetirizine (ZYRTEC) 5 MG tablet Take 5 mg by mouth daily as needed for allergies or rhinitis.     [provider]  citalopram (CELEXA) 10 MG tablet Take 10 mg by mouth daily.    [provider]  clopidogrel (PLAVIX) 75 MG tablet Take 1 tablet (75 mg total) by mouth daily. 10/09/15   Bettey Costa, MD  colchicine 0.6 MG tablet Take 1 tablet (0.6 mg total) by mouth daily. Patient taking differently: Take 1.8 mg by mouth daily as needed (gout flares).  11/17/18   Bettey Costa, MD  dextromethorphan-guaiFENesin (MUCINEX DM) 30-600 MG 12hr tablet Take 1 tablet by mouth 2 (two) times daily as needed for cough.    [provider]  Dulaglutide (TRULICITY) 1.5 TM/1.9QQ SOPN Trulicity 1.5 IW/9.7 mL  subcutaneous pen injector  INJECT 1.5 MG SUBCUTANEOUSLY EVERY 7 (SEVEN) DAYS 01/21/19   [provider]  EPINEPHrine (EPI-PEN) 0.3 mg/0.3 mL SOAJ injection Inject into the muscle as needed (anaphylaxis).     [provider]  ferrous sulfate 325 (65 FE) MG EC tablet Take 1 tablet (325 mg total) by mouth daily. 11/10/19   Earlie Server, MD  fluticasone (FLONASE) 50 MCG/ACT nasal spray Place 2 sprays into both nostrils daily.  11/04/18   [provider]  folic acid (V-R FOLIC ACID) 540 MCG tablet Take 1 tablet (400 mcg total) by mouth daily. 12/30/18   Earlie Server, MD  furosemide (LASIX) 20 MG tablet Take 2 tablets (40 mg total) by mouth daily. 11/17/18   Bettey Costa, MD  Icosapent Ethyl 1 g CAPS Take by mouth. 01/27/19   [provider]  insulin aspart (NOVOLOG FLEXPEN) 100 UNIT/ML FlexPen Inject 20-32 Units into the skin See admin instructions. 20-32 units THREE daily as directed    [provider]  insulin glargine (LANTUS) 100 UNIT/ML injection Inject 70 Units into  the skin at bedtime.     [provider]  ipratropium-albuterol (DUONEB) 0.5-2.5 (3) MG/3ML SOLN Take 3 mLs by nebulization every 4 (four) hours as needed. Patient not taking: Reported on 11/10/2019 05/15/17   Hillary Bow, MD  isosorbide mononitrate (IMDUR) 60 MG 24 hr tablet Take by mouth. 01/28/19   [provider]  losartan (COZAAR) 25 MG tablet Take by mouth. 01/19/19   [provider]  magnesium gluconate (MAGONATE) 500 MG tablet Take 500 mg by mouth 2 (two) times daily.     [provider]  methocarbamol (ROBAXIN) 500 MG tablet Take 500 mg by mouth 2 (two) times daily.  11/08/18   [provider]  metoprolol tartrate (LOPRESSOR) 50 MG tablet Take 75 mg by mouth 2 (two) times daily.     [provider]  montelukast (SINGULAIR) 10 MG tablet TAKE 1 TABLET BY MOUTH EVERYDAY AT BEDTIME 11/18/19   Scarboro, Audie Clear, NP  nitroGLYCERIN (NITROSTAT) 0.4 MG SL tablet Place 0.4 mg under the tongue every 5 (five) minutes x 3 doses as needed for chest pain.     [provider]  Omega-3 Fatty Acids (FISH OIL) 1000 MG CAPS Take 1 capsule by mouth daily.     [provider]  pantoprazole (PROTONIX) 40 MG tablet Take 40 mg by mouth 2 (two) times daily.    [provider]  rosuvastatin (CRESTOR) 40 MG tablet Take 40 mg by mouth every evening.     [provider]  Vitamin D, Ergocalciferol, (DRISDOL) 50000 units CAPS capsule Take 50,000 Units by mouth every 30 (thirty) days.     [provider]    Physical Exam: Vitals:   12/14/19 0231 12/14/19 0235 12/14/19 0703  BP: (!) 189/77  (!) 187/72  Pulse: 73  80  Resp: (!) 22  (!) 21  Temp: 99 F (37.2 C)    TempSrc: Oral    SpO2: 96%  97%  Weight:  93.4 kg   Height:  5\' 6"  (1.676 m)    General: Not in acute distress HEENT:       Eyes: PERRL, EOMI, no scleral icterus.       ENT: No discharge from the ears and nose, no pharynx injection, no tonsillar enlargement.         Neck: No JVD, no bruit, no mass felt. Heme: No neck lymph node enlargement. Cardiac: S1/S2, RRR, No  murmurs, No gallops or rubs. Respiratory:  No rales, wheezing, rhonchi or rubs. GI: Soft, nondistended, nontender, no rebound pain, no organomegaly, BS present. GU: No hematuria Ext: 1+ pitting leg edema bilaterally. 2+DP/PT pulse bilaterally. Musculoskeletal: No joint deformities, No joint redness or warmth, no limitation of ROM in spin. Skin: No rashes.  Neuro: Alert, oriented X3, cranial nerves II-XII grossly intact, moves all extremities normally. Psych: Patient is not psychotic, no suicidal or hemocidal ideation.  Labs on Admission: I have personally reviewed following labs and imaging studies  CBC: Recent Labs  Lab 12/14/19 0233  WBC 10.7*  HGB 12.2*  HCT 35.2*  MCV 87.3  PLT 294   Basic Metabolic Panel: Recent Labs  Lab 12/14/19 0233  NA 137  K 4.0  CL 105  CO2 21*  GLUCOSE 290*  BUN 42*  CREATININE 1.60*  CALCIUM 8.9   GFR: Estimated Creatinine Clearance: 40 mL/min (A) (by C-G formula based on SCr of 1.6 mg/dL (H)). Liver Function Tests: No results for input(s): AST, ALT, ALKPHOS, BILITOT, PROT, ALBUMIN in the last 168 hours. No results for input(s): LIPASE, AMYLASE in the last 168 hours. No results for input(s): AMMONIA in the last 168 hours. Coagulation Profile: Recent Labs  Lab 12/14/19 0752  INR 0.9   Cardiac Enzymes: No results for input(s): CKTOTAL, CKMB, CKMBINDEX, TROPONINI in the last 168 hours. BNP (last 3 results) No results for input(s): PROBNP in the last 8760 hours. HbA1C: No results for input(s): HGBA1C in the last 72 hours. CBG: No results for input(s): GLUCAP in the last 168 hours. Lipid Profile: No results for input(s): CHOL, HDL, LDLCALC, TRIG, CHOLHDL, LDLDIRECT in the last 72 hours. Thyroid Function Tests: No results for input(s): TSH, T4TOTAL, FREET4, T3FREE, THYROIDAB in the last 72 hours. Anemia Panel: No results for  input(s): VITAMINB12, FOLATE, FERRITIN, TIBC, IRON, RETICCTPCT in the last 72 hours. Urine analysis:    Component Value Date/Time   COLORURINE YELLOW (A) 12/11/2018 1512   APPEARANCEUR CLEAR (A) 12/11/2018 1512   LABSPEC 1.013 12/11/2018 1512   PHURINE 5.0 12/11/2018 1512   GLUCOSEU NEGATIVE 12/11/2018 1512   HGBUR NEGATIVE 12/11/2018 1512   BILIRUBINUR NEGATIVE 12/11/2018 1512   KETONESUR NEGATIVE 12/11/2018 1512   PROTEINUR NEGATIVE 12/11/2018 1512   NITRITE NEGATIVE 12/11/2018 1512   LEUKOCYTESUR NEGATIVE 12/11/2018 1512   Sepsis Labs: @LABRCNTIP (procalcitonin:4,lacticidven:4) )No results found for this or any previous visit (from the past 240 hour(s)).   Radiological Exams on Admission: DG Chest 2 View  Result Date: 12/14/2019 CLINICAL DATA:  Chest pain EXAM: CHEST - 2 VIEW COMPARISON:  11/24/2019, 06/10/2019 FINDINGS: No consolidation or pleural effusion. Mild coarse bronchitic changes. Stable cardiomediastinal silhouette with borderline cardiomegaly. No pneumothorax. IMPRESSION: Low lung volumes with coarse bronchitic changes. No consolidative airspace disease. Borderline to mild cardiomegaly. Electronically Signed   By: Donavan Foil M.D.   On: 12/14/2019 03:18     EKG: Independently reviewed.  Sinus rhythm, QTC 471, LAE, nonspecific T wave change.  Assessment/Plan Principal Problem:   Chest pain Active Problems:   Essential hypertension   Hyperlipidemia   NSTEMI (non-ST elevated myocardial infarction) (HCC)   CAD (coronary artery disease)   History of stroke   Chronic diastolic heart failure (HCC)   CKD (chronic kidney disease) stage 4, GFR 15-29 ml/min (HCC)   COPD (chronic obstructive pulmonary disease) (HCC)   Gout   OSA (obstructive sleep apnea)   Type II diabetes mellitus with renal manifestations (HCC)   Chest pain, elevated trop and  hx of CAD: s/p of stent placement.  Troponin elevated 93 -->125, concerning for NSTEMI. Card, Dr. Clayborn Bigness is consulted.  -  will place on Tele bed for obs - IV heparin is stared in ED -->will continue - Trend Trop - Repeat EKG in the am  - prn Nitroglycerin, Morphine - Crestor, Imdur - ASA 81 mg daily (pt is allergic to NSAIDS, but he states that he can take aspirin) - Risk factor stratification: will check FLP and A1C  - check UDS - 2d echo - f/u Card recommendations - keep pt NPO until card sees pt  Addendum: RN report that tele monitoring shows possible new A fib -asked RN to get stat EKG -pt is on IV heparin  Chronic diastolic heart failure: 2D echo on 11/15/2018 showed EF of 55-60%.  Patient has X bilateral leg edema, indicating mild CHF exacerbation. -Pending BNP -IV lasix 40 mg daily -f/u 2d echo  HTN:  -Continue home medications: Amlodipine, Cozaar, metoprolol -hydralazine prn  Hyperlipidemia: -Cresotor  History of stroke: -on plavix and crestor  CKD (chronic kidney disease) stage 4, GFR 15-29 ml/min ([place]): Stable.  Baseline creatinine 1.6-2.0.  His creatinine is 1.60, BUN 42. -Follow-up renal function by BMP  COPD (chronic obstructive pulmonary disease) ([place]): -Continue bronchodilators, Singulair, as needed Mucinex  Gout:  -continue home allopurinol and Colchicine prn  Type II diabetes mellitus with renal manifestations ([place]): Last A1c 7.2, poorly controled. Patient is taking NovoLog, Lantus at home -will decrease Lantus dose from 70 to 50 units daily -SSI  OSA: -CPAP    DVT ppx: on IV Heparin    Code Status: Full code Family Communication: None at bed side.    Disposition Plan:  Anticipate discharge back to previous home environment Consults called:  Card, Dr. Clayborn Bigness Admission status:Tele bed for obs  Date of Service 12/14/2019    [facility]   If 7PM-7AM, please contact night-coverage [email or web address] Password [facility] 12/14/2019, 8:58 AM

## 2019-12-14 NOTE — ED Triage Notes (Signed)
Pt arrives via [facility] from home.  Pt cc midsternal chest pain since 1am radiating to L arm. Pt reports taking 3 nitro 1:15am. Pt has cardiac hx.  Pt reports upon arrival chest pain has subsided (0/10) and L arm still hurts. Pt reports feet both swollen.   BS 188

## 2019-12-14 NOTE — ED Notes (Signed)
Echo at bedside

## 2019-12-15 ENCOUNTER — Encounter: Payer: Self-pay | Admitting: Internal Medicine

## 2019-12-15 DIAGNOSIS — Z8673 Personal history of transient ischemic attack (TIA), and cerebral infarction without residual deficits: Secondary | ICD-10-CM | POA: Diagnosis not present

## 2019-12-15 DIAGNOSIS — J449 Chronic obstructive pulmonary disease, unspecified: Secondary | ICD-10-CM | POA: Diagnosis present

## 2019-12-15 DIAGNOSIS — Z6831 Body mass index (BMI) 31.0-31.9, adult: Secondary | ICD-10-CM | POA: Diagnosis not present

## 2019-12-15 DIAGNOSIS — I2510 Atherosclerotic heart disease of native coronary artery without angina pectoris: Secondary | ICD-10-CM | POA: Diagnosis not present

## 2019-12-15 DIAGNOSIS — I130 Hypertensive heart and chronic kidney disease with heart failure and stage 1 through stage 4 chronic kidney disease, or unspecified chronic kidney disease: Secondary | ICD-10-CM | POA: Diagnosis present

## 2019-12-15 DIAGNOSIS — I25119 Atherosclerotic heart disease of native coronary artery with unspecified angina pectoris: Secondary | ICD-10-CM | POA: Diagnosis present

## 2019-12-15 DIAGNOSIS — R778 Other specified abnormalities of plasma proteins: Secondary | ICD-10-CM | POA: Diagnosis not present

## 2019-12-15 DIAGNOSIS — I5033 Acute on chronic diastolic (congestive) heart failure: Secondary | ICD-10-CM | POA: Diagnosis present

## 2019-12-15 DIAGNOSIS — E1122 Type 2 diabetes mellitus with diabetic chronic kidney disease: Secondary | ICD-10-CM | POA: Diagnosis present

## 2019-12-15 DIAGNOSIS — E1129 Type 2 diabetes mellitus with other diabetic kidney complication: Secondary | ICD-10-CM | POA: Diagnosis not present

## 2019-12-15 DIAGNOSIS — E785 Hyperlipidemia, unspecified: Secondary | ICD-10-CM | POA: Diagnosis present

## 2019-12-15 DIAGNOSIS — Z7902 Long term (current) use of antithrombotics/antiplatelets: Secondary | ICD-10-CM | POA: Diagnosis not present

## 2019-12-15 DIAGNOSIS — I252 Old myocardial infarction: Secondary | ICD-10-CM | POA: Diagnosis not present

## 2019-12-15 DIAGNOSIS — R079 Chest pain, unspecified: Secondary | ICD-10-CM | POA: Diagnosis not present

## 2019-12-15 DIAGNOSIS — Z79899 Other long term (current) drug therapy: Secondary | ICD-10-CM | POA: Diagnosis not present

## 2019-12-15 DIAGNOSIS — I2583 Coronary atherosclerosis due to lipid rich plaque: Secondary | ICD-10-CM | POA: Diagnosis not present

## 2019-12-15 DIAGNOSIS — M109 Gout, unspecified: Secondary | ICD-10-CM | POA: Diagnosis present

## 2019-12-15 DIAGNOSIS — I208 Other forms of angina pectoris: Secondary | ICD-10-CM | POA: Diagnosis not present

## 2019-12-15 DIAGNOSIS — I5032 Chronic diastolic (congestive) heart failure: Secondary | ICD-10-CM | POA: Diagnosis not present

## 2019-12-15 DIAGNOSIS — M1990 Unspecified osteoarthritis, unspecified site: Secondary | ICD-10-CM | POA: Diagnosis present

## 2019-12-15 DIAGNOSIS — K219 Gastro-esophageal reflux disease without esophagitis: Secondary | ICD-10-CM | POA: Diagnosis present

## 2019-12-15 DIAGNOSIS — I10 Essential (primary) hypertension: Secondary | ICD-10-CM | POA: Diagnosis not present

## 2019-12-15 DIAGNOSIS — N184 Chronic kidney disease, stage 4 (severe): Secondary | ICD-10-CM | POA: Diagnosis present

## 2019-12-15 DIAGNOSIS — K5732 Diverticulitis of large intestine without perforation or abscess without bleeding: Secondary | ICD-10-CM | POA: Diagnosis present

## 2019-12-15 DIAGNOSIS — G4733 Obstructive sleep apnea (adult) (pediatric): Secondary | ICD-10-CM | POA: Diagnosis present

## 2019-12-15 DIAGNOSIS — K2270 Barrett's esophagus without dysplasia: Secondary | ICD-10-CM | POA: Diagnosis present

## 2019-12-15 DIAGNOSIS — I5023 Acute on chronic systolic (congestive) heart failure: Secondary | ICD-10-CM | POA: Diagnosis not present

## 2019-12-15 DIAGNOSIS — Z20822 Contact with and (suspected) exposure to covid-19: Secondary | ICD-10-CM | POA: Diagnosis present

## 2019-12-15 DIAGNOSIS — E1151 Type 2 diabetes mellitus with diabetic peripheral angiopathy without gangrene: Secondary | ICD-10-CM | POA: Diagnosis present

## 2019-12-15 DIAGNOSIS — Z85828 Personal history of other malignant neoplasm of skin: Secondary | ICD-10-CM | POA: Diagnosis not present

## 2019-12-15 DIAGNOSIS — I248 Other forms of acute ischemic heart disease: Secondary | ICD-10-CM | POA: Diagnosis present

## 2019-12-15 DIAGNOSIS — E669 Obesity, unspecified: Secondary | ICD-10-CM | POA: Diagnosis present

## 2019-12-15 DIAGNOSIS — Z955 Presence of coronary angioplasty implant and graft: Secondary | ICD-10-CM | POA: Diagnosis not present

## 2019-12-15 DIAGNOSIS — Z794 Long term (current) use of insulin: Secondary | ICD-10-CM | POA: Diagnosis not present

## 2019-12-15 LAB — LIPID PANEL
Cholesterol: 107 mg/dL (ref 0–200)
HDL: 22 mg/dL — ABNORMAL LOW (ref >40)
LDL Cholesterol: 33 mg/dL (ref 0–99)
Total CHOL/HDL Ratio: 4.9 RATIO
Triglycerides: 260 mg/dL — ABNORMAL HIGH (ref <150)
VLDL: 52 mg/dL — ABNORMAL HIGH (ref 0–40)

## 2019-12-15 LAB — GLUCOSE, CAPILLARY
Glucose-Capillary: 139 mg/dL — ABNORMAL HIGH (ref 70–99)
Glucose-Capillary: 151 mg/dL — ABNORMAL HIGH (ref 70–99)
Glucose-Capillary: 153 mg/dL — ABNORMAL HIGH (ref 70–99)
Glucose-Capillary: 187 mg/dL — ABNORMAL HIGH (ref 70–99)
Glucose-Capillary: 188 mg/dL — ABNORMAL HIGH (ref 70–99)
Glucose-Capillary: 194 mg/dL — ABNORMAL HIGH (ref 70–99)
Glucose-Capillary: 246 mg/dL — ABNORMAL HIGH (ref 70–99)

## 2019-12-15 LAB — HEMOGLOBIN A1C
Hgb A1c MFr Bld: 8.5 % — ABNORMAL HIGH (ref 4.8–5.6)
Hgb A1c MFr Bld: 8.7 % — ABNORMAL HIGH (ref 4.8–5.6)
Mean Plasma Glucose: 197 mg/dL
Mean Plasma Glucose: 202.99 mg/dL

## 2019-12-15 LAB — HEPARIN LEVEL (UNFRACTIONATED)
Heparin Unfractionated: 0.37 IU/mL (ref 0.30–0.70)
Heparin Unfractionated: 0.59 IU/mL (ref 0.30–0.70)

## 2019-12-15 LAB — CBC
HCT: 32.8 % — ABNORMAL LOW (ref 39.0–52.0)
Hemoglobin: 10.8 g/dL — ABNORMAL LOW (ref 13.0–17.0)
MCH: 30.5 pg (ref 26.0–34.0)
MCHC: 32.9 g/dL (ref 30.0–36.0)
MCV: 92.7 fL (ref 80.0–100.0)
Platelets: 225 10*3/uL (ref 150–400)
RBC: 3.54 MIL/uL — ABNORMAL LOW (ref 4.22–5.81)
RDW: 14.5 % (ref 11.5–15.5)
WBC: 10.8 10*3/uL — ABNORMAL HIGH (ref 4.0–10.5)
nRBC: 0 % (ref 0.0–0.2)

## 2019-12-15 LAB — ECHOCARDIOGRAM COMPLETE
Height: 66 in
Weight: 3296 oz

## 2019-12-15 MED ORDER — IPRATROPIUM-ALBUTEROL 0.5-2.5 (3) MG/3ML IN SOLN
RESPIRATORY_TRACT | Status: AC
  Administered 2019-12-15: 3 mL via RESPIRATORY_TRACT
  Filled 2019-12-15: qty 3

## 2019-12-15 MED ORDER — LOSARTAN POTASSIUM 50 MG PO TABS
100.0000 mg | ORAL_TABLET | Freq: Every day | ORAL | Status: DC
  Filled 2019-12-15: qty 2

## 2019-12-15 MED ORDER — POLYETHYLENE GLYCOL 3350 17 G PO PACK
17.0000 g | PACK | Freq: Every day | ORAL | Status: DC
  Administered 2019-12-15 – 2019-12-16 (×2): 17 g via ORAL
  Filled 2019-12-15 (×3): qty 1

## 2019-12-15 MED ORDER — NITROGLYCERIN 0.4 MG SL SUBL
SUBLINGUAL_TABLET | SUBLINGUAL | Status: AC
  Administered 2019-12-15: 0.4 mg via SUBLINGUAL
  Filled 2019-12-15: qty 1

## 2019-12-15 MED ORDER — INSULIN ASPART 100 UNIT/ML ~~LOC~~ SOLN
SUBCUTANEOUS | Status: AC
  Administered 2019-12-15: 2 [IU] via SUBCUTANEOUS
  Filled 2019-12-15: qty 1

## 2019-12-15 MED ORDER — FUROSEMIDE 10 MG/ML IJ SOLN
INTRAMUSCULAR | Status: AC
  Administered 2019-12-15: 40 mg via INTRAVENOUS
  Filled 2019-12-15: qty 4

## 2019-12-15 MED ORDER — GUAIFENESIN-DM 100-10 MG/5ML PO SYRP
5.0000 mL | ORAL_SOLUTION | ORAL | Status: DC | PRN
  Administered 2019-12-15: 5 mL via ORAL
  Filled 2019-12-15 (×2): qty 5

## 2019-12-15 MED ORDER — IPRATROPIUM-ALBUTEROL 0.5-2.5 (3) MG/3ML IN SOLN
RESPIRATORY_TRACT | Status: AC
  Filled 2019-12-15: qty 3

## 2019-12-15 MED ORDER — SODIUM CHLORIDE FLUSH 0.9 % IV SOLN
INTRAVENOUS | Status: AC
  Filled 2019-12-15: qty 10

## 2019-12-15 MED ORDER — HYDRALAZINE HCL 25 MG PO TABS
25.0000 mg | ORAL_TABLET | Freq: Three times a day (TID) | ORAL | Status: DC
  Administered 2019-12-16 – 2019-12-17 (×5): 25 mg via ORAL
  Filled 2019-12-15 (×5): qty 1

## 2019-12-15 MED ORDER — INSULIN ASPART 100 UNIT/ML ~~LOC~~ SOLN
SUBCUTANEOUS | Status: AC
  Filled 2019-12-15: qty 1

## 2019-12-15 MED ORDER — DOCUSATE SODIUM 100 MG PO CAPS
100.0000 mg | ORAL_CAPSULE | Freq: Two times a day (BID) | ORAL | Status: DC
  Administered 2019-12-15 – 2019-12-17 (×5): 100 mg via ORAL
  Filled 2019-12-15 (×5): qty 1

## 2019-12-15 NOTE — Progress Notes (Signed)
[facility] Cardiology    SUBJECTIVE: Patient denies any chest pain complains of mild cough no significant shortness of breath at rest no fever chills or sweats.  Reportedly had an episode of atrial fibrillation reported by the nurses on telemetry unfortunately were unable to document with EKG or telemetry strip.  Denies any significant palpitations or tachycardia no fever still has intermittent cough no sputum production leg edema somewhat improved   Vitals:   12/15/19 1400 12/15/19 1705 12/15/19 1931 12/15/19 2140  BP: (!) 150/74 (!) 154/61 (!) 176/77 (!) 194/74  Pulse: 78 68 67 72  Resp: 18 17    Temp: 98.7 F (37.1 C) 98.6 F (37 C)    TempSrc:      SpO2:  100% 100%   Weight:      Height:         Intake/Output Summary (Last 24 hours) at 12/15/2019 2213 Last data filed at 12/15/2019 1900 Gross per 24 hour  Intake 490 ml  Output 1925 ml  Net -1435 ml      PHYSICAL EXAM  General: Well developed, well nourished, in no acute distress HEENT:  Normocephalic and atramatic Neck:  No JVD.  Lungs: Clear bilaterally to auscultation and percussion. Heart: HRRR . Normal S1 and S2 without gallops or murmurs.  Abdomen: Bowel sounds are positive, abdomen soft and non-tender  Msk:  Back normal, normal gait. Normal strength and tone for age. Extremities: No clubbing, cyanosis or edema.   Neuro: Alert and oriented X 3. Psych:  Good affect, responds appropriately   LABS: Basic Metabolic Panel: Recent Labs    12/14/19 0233  NA 137  K 4.0  CL 105  CO2 21*  GLUCOSE 290*  BUN 42*  CREATININE 1.60*  CALCIUM 8.9   Liver Function Tests: No results for input(s): AST, ALT, ALKPHOS, BILITOT, PROT, ALBUMIN in the last 72 hours. No results for input(s): LIPASE, AMYLASE in the last 72 hours. CBC: Recent Labs    12/14/19 0233 12/15/19 0209  WBC 10.7* 10.8*  HGB 12.2* 10.8*  HCT 35.2* 32.8*  MCV 87.3 92.7  PLT 227 225   Cardiac Enzymes: No results for input(s): CKTOTAL, CKMB, CKMBINDEX,  TROPONINI in the last 72 hours. BNP: Invalid input(s): POCBNP D-Dimer: No results for input(s): DDIMER in the last 72 hours. Hemoglobin A1C: Recent Labs    12/15/19 0209  HGBA1C 8.7*   Fasting Lipid Panel: Recent Labs    12/15/19 0209  CHOL 107  HDL 22*  LDLCALC 33  TRIG 260*  CHOLHDL 4.9   Thyroid Function Tests: No results for input(s): TSH, T4TOTAL, T3FREE, THYROIDAB in the last 72 hours.  Invalid input(s): FREET3 Anemia Panel: No results for input(s): VITAMINB12, FOLATE, FERRITIN, TIBC, IRON, RETICCTPCT in the last 72 hours.  DG Chest 2 View  Result Date: 12/14/2019 CLINICAL DATA:  Chest pain EXAM: CHEST - 2 VIEW COMPARISON:  11/24/2019, 06/10/2019 FINDINGS: No consolidation or pleural effusion. Mild coarse bronchitic changes. Stable cardiomediastinal silhouette with borderline cardiomegaly. No pneumothorax. IMPRESSION: Low lung volumes with coarse bronchitic changes. No consolidative airspace disease. Borderline to mild cardiomegaly. Electronically Signed   By: Donavan Foil M.D.   On: 12/14/2019 03:18   ECHOCARDIOGRAM COMPLETE  Result Date: 12/15/2019   ECHOCARDIOGRAM REPORT   Patient Name:   Mike Decker Date of Exam: 12/14/2019 Medical Rec #:  [id number]        Height:       66.0 in Accession #:    [id number]  Weight:       206.0 lb Date of Birth:  12/06/1941        BSA:          2.03 m Patient Age:    79 years         BP:           130/57 mmHg Patient Gender: M                HR:           68 bpm. Exam Location:  [facility] Procedure: 2D Echo, Color Doppler and Cardiac Doppler Indications:     Chest pain 786.50  History:         Patient has prior history of Echocardiogram examinations, most                  recent 11/15/2018. Risk Factors:Sleep Apnea, Hypertension and                  Diabetes. Mi, PVD.  Sonographer:     Sherrie Sport RDCS (AE) Referring Phys:  [id number] Soledad Gerlach NIU Diagnosing Phys: Yolonda Kida MD  Sonographer Comments: Suboptimal parasternal window. IMPRESSIONS   1. Left ventricular ejection fraction, by visual estimation, is 50 to 55%. The left ventricle has normal function. Normal left ventricular posterior wall thickness. There is no left ventricular hypertrophy.  2. The left ventricle has no regional wall motion abnormalities.  3. Global right ventricle has normal systolic function.The right ventricular size is normal. No increase in right ventricular wall thickness.  4. Left atrial size was normal.  5. Right atrial size was normal.  6. The mitral valve is normal in structure. Mild mitral valve regurgitation.  7. The tricuspid valve is not well visualized.  8. The aortic valve is grossly normal. Aortic valve regurgitation is not visualized. Mild aortic valve sclerosis without stenosis.  9. The pulmonic valve was normal in structure. Pulmonic valve regurgitation is not visualized. 10. Mildly elevated pulmonary artery systolic pressure. FINDINGS  Left Ventricle: Left ventricular ejection fraction, by visual estimation, is 50 to 55%. The left ventricle has normal function. The left ventricle has no regional wall motion abnormalities. Normal left ventricular posterior wall thickness. There is no left ventricular hypertrophy. Right Ventricle: The right ventricular size is normal. No increase in right ventricular wall thickness. Global RV systolic function is has normal systolic function. The tricuspid regurgitant velocity is 2.36 m/s, and with an assumed right atrial pressure  of 10 mmHg, the estimated right ventricular systolic pressure is mildly elevated at 32.3 mmHg. Left Atrium: Left atrial size was normal in size. Right Atrium: Right atrial size was normal in size Pericardium: There is no evidence of pericardial effusion. Mitral Valve: The mitral valve is normal in structure. Mild mitral valve regurgitation. Tricuspid Valve: The tricuspid valve is not well visualized. Tricuspid valve regurgitation is trivial. Aortic Valve: The aortic valve is grossly normal. Aortic valve  regurgitation is not visualized. Mild aortic valve sclerosis is present, with no evidence of aortic valve stenosis. Aortic valve mean gradient measures 11.3 mmHg. Aortic valve peak gradient measures 20.8 mmHg. Aortic valve area, by VTI measures 1.19 cm. Pulmonic Valve: The pulmonic valve was normal in structure. Pulmonic valve regurgitation is not visualized. Pulmonic regurgitation is not visualized. Aorta: The aortic root is normal in size and structure. IAS/Shunts: No atrial level shunt detected by color flow Doppler.  LEFT VENTRICLE PLAX 2D LVIDd:         4.05 cm  LVIDs:         2.98 cm LV PW:         1.25 cm LV IVS:        1.80 cm LVOT diam:     2.00 cm LV SV:         38 ml LV SV Index:   17.76 LVOT Area:     3.14 cm  RIGHT VENTRICLE RV Basal diam:  3.73 cm RV S prime:     12.40 cm/s TAPSE (M-mode): 3.4 cm LEFT ATRIUM             Index       RIGHT ATRIUM           Index LA diam:        4.00 cm 1.98 cm/m  RA Area:     18.30 cm LA Vol (A2C):   56.6 ml 27.95 ml/m RA Volume:   52.10 ml  25.73 ml/m LA Vol (A4C):   68.0 ml 33.58 ml/m LA Biplane Vol: 65.6 ml 32.39 ml/m  AORTIC VALVE                    PULMONIC VALVE AV Area (Vmax):    1.12 cm     PV Vmax:        0.98 m/s AV Area (Vmean):   1.18 cm     PV Peak grad:   3.8 mmHg AV Area (VTI):     1.19 cm     RVOT Peak grad: 6 mmHg AV Vmax:           228.00 cm/s AV Vmean:          152.000 cm/s AV VTI:            0.448 m AV Peak Grad:      20.8 mmHg AV Mean Grad:      11.3 mmHg LVOT Vmax:         81.30 cm/s LVOT Vmean:        57.000 cm/s LVOT VTI:          0.170 m LVOT/AV VTI ratio: 0.38  AORTA Ao Root diam: 2.90 cm MITRAL VALVE                       TRICUSPID VALVE MV Area (PHT): 1.12 cm            TR Peak grad:   22.3 mmHg MV PHT:        196.62 msec         TR Vmax:        236.00 cm/s MV Decel Time: 678 msec MV E velocity: 96.25 cm/s 103 cm/s SHUNTS                                    Systemic VTI:  0.17 m                                    Systemic Diam: 2.00  cm  Adriannah Steinkamp D Janiaya Ryser MD Electronically signed by Yolonda Kida MD Signature Date/Time: 12/15/2019/11:31:19 AM    Final      Echo preserved left ventricular function ejection fraction of around 60% no significant wall motion abnormalities  TELEMETRY: Normal sinus rhythm reportedly had an episode of atrial fibrillation but was not documented  by EKG or telemetry:  ASSESSMENT AND PLAN:  Principal Problem:   Chest pain Active Problems:   Essential hypertension   Hyperlipidemia   NSTEMI (non-ST elevated myocardial infarction) (HCC)   CAD (coronary artery disease)   History of stroke   Chronic diastolic heart failure (HCC)   CKD (chronic kidney disease) stage 4, GFR 15-29 ml/min (HCC)   COPD (chronic obstructive pulmonary disease) (HCC)   Gout   OSA (obstructive sleep apnea)   Type II diabetes mellitus with renal manifestations ([place])    Plan Probable demand ischemia with borderline troponins Preserved left ventricular function ejection fraction around 60% Will increase losartan for heart failure symptoms to 100 mg a day Recommend discontinue heparin for anticoagulation Questionable atrial fibrillation episode but was not documented on EKG or telemetry Follow-up renal function with increasing dose of losartan Maintain CPAP obstructive sleep apnea History of CVA continue aspirin Plavix therapy Do not recommend long-term high-level anticoagulation unless A. fib definitely documented on EKG or telemetry for Holter Continue diabetes management Do not recommend invasive strategy like cardiac cath at this point Consider discharge tomorrow if patient ambulates reasonably well Follow-up with cardiology 1 to 2 weeks   Yolonda Kida, MD 12/15/2019 10:13 PM

## 2019-12-15 NOTE — Progress Notes (Signed)
Called into room with pt stating he felt like he had flipped into  Afib.  Per monitor, cont NSR in the 80's.  BP 194/80.  Meds given per scheduled times along with hydralazine prn.  R.T called to inform of pt requiring CPAP at night. BA

## 2019-12-15 NOTE — Progress Notes (Signed)
[place] for heparin Indication: chest pain/ACS  Allergies  Allergen Reactions  . Gabapentin     Other reaction(s): Other (See Comments) Tremors  . Peanut-Containing Drug Products Anaphylaxis  . Penicillins Hives and Rash    Has patient had a PCN reaction causing immediate rash, facial/tongue/throat swelling, SOB or lightheadedness with hypotension: Yes Has patient had a PCN reaction causing severe rash involving mucus membranes or skin necrosis: No Has patient had a PCN reaction that required hospitalization No Has patient had a PCN reaction occurring within the last 10 years: No If all of the above answers are "NO", then may proceed with Cephalosporin use.  . Bee Venom Swelling  . Influenza Vaccines Hives  . Inh [Isoniazid] Hives  . Kenalog [Triamcinolone Acetonide] Hives  . Levaquin [Levofloxacin] Other (See Comments)    Tendon, ligament pain.   . Nalfon [Fenoprofen Calcium] Hives  . Naproxen   . Peanut Oil   . Nsaids Rash    Nalfon 600 Nalfon 600    Patient Measurements: Height: 5\' 6"  (167.6 cm) Weight: 206 lb (93.4 kg) IBW/kg (Calculated) : 63.8 Heparin Dosing Weight: 83.9 kg  Vital Signs: BP: 157/57 (01/05 0951) Pulse Rate: 78 (01/05 0951)  Labs: Recent Labs    12/14/19 0233 12/14/19 0752 12/14/19 0853 12/14/19 1127 12/14/19 1414 12/14/19 1619 12/15/19 0209 12/15/19 1014  HGB 12.2*  --   --   --   --   --  10.8*  --   HCT 35.2*  --   --   --   --   --  32.8*  --   PLT 227  --   --   --   --   --  225  --   APTT  --  32  --   --   --   --   --   --   LABPROT  --  12.5  --   --   --   --   --   --   INR  --  0.9  --   --   --   --   --   --   HEPARINUNFRC  --   --   --   --   --  0.26* 0.37 0.59  CREATININE 1.60*  --   --   --   --   --   --   --   TROPONINIHS 93*  --  131* 145* 149*  --   --   --     Estimated Creatinine Clearance: 40 mL/min (A) (by C-G formula based on SCr of 1.6 mg/dL (H)).   Medical  History: Past Medical History:  Diagnosis Date  . Anemia   . Arthritis   . Atrioventricular canal (AVC)    irregular heart beats  . Barrett esophagus   . 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 Chronic diastolic CHF (congestive heart failure) ([place])   . Colon polyp   . Diabetes mellitus without complication ([place])   . Diverticulosis   . Gout   . Heart disease   . Hemangioma    liver  . Hyperlipidemia   . Hypertension   . Myocardial infarct ([place])   . Ocular hypertension   . Peripheral vascular disease ([place])   . Skin cancer   . Sleep apnea   . Vitreoretinal degeneration     Assessment: Patient arrives w/ CP w/ initial trops of 93 >> 125, EKG w/ nonspecific intraventricular  conduction delay. No anticoagulation PTA. Patient is being started on heparin drip for NSTEMI. Baseline CBC WNL for patient.  Cardiology recommending 24 to 48 hours of heparin.  1/4 Bolused 4000 units, initial rate 1050 units/hour 1/4 1619 0.26 Bolused 1200 units, rate increased to 1200 units/hr 1/5 0209 HL 0.37, therapeutic x 1, continue 1200 units/hr  Goal of Therapy:  Heparin level 0.3-0.7 units/ml Monitor platelets by anticoagulation protocol: Yes   Plan:  1/5 1014 HL 0.59 therapeutic x 2.  Hemoglobin slight downtrend, platelets stable.  Continue current rate and recheck HL in the AM.  Pharmacy will follow CBC in the AM as well.  Gerald Dexter, PharmD Pharmacy Resident  12/15/2019 11:42 AM

## 2019-12-15 NOTE — Progress Notes (Signed)
[place] for heparin Indication: chest pain/ACS  Allergies  Allergen Reactions  . Gabapentin     Other reaction(s): Other (See Comments) Tremors  . Peanut-Containing Drug Products Anaphylaxis  . Penicillins Hives and Rash    Has patient had a PCN reaction causing immediate rash, facial/tongue/throat swelling, SOB or lightheadedness with hypotension: Yes Has patient had a PCN reaction causing severe rash involving mucus membranes or skin necrosis: No Has patient had a PCN reaction that required hospitalization No Has patient had a PCN reaction occurring within the last 10 years: No If all of the above answers are "NO", then may proceed with Cephalosporin use.  . Bee Venom Swelling  . Influenza Vaccines Hives  . Inh [Isoniazid] Hives  . Kenalog [Triamcinolone Acetonide] Hives  . Levaquin [Levofloxacin] Other (See Comments)    Tendon, ligament pain.   . Nalfon [Fenoprofen Calcium] Hives  . Naproxen   . Peanut Oil   . Nsaids Rash    Nalfon 600 Nalfon 600    Patient Measurements: Height: 5\' 6"  (167.6 cm) Weight: 206 lb (93.4 kg) IBW/kg (Calculated) : 63.8 Heparin Dosing Weight: 83.9 kg  Vital Signs: Temp: 97.5 F (36.4 C) (01/04 1550) Temp Source: Temporal (01/04 1550) BP: 148/56 (01/05 0220) Pulse Rate: 57 (01/05 0220)  Labs: Recent Labs    12/14/19 0233 12/14/19 0752 12/14/19 0853 12/14/19 1127 12/14/19 1414 12/14/19 1619 12/15/19 0209  HGB 12.2*  --   --   --   --   --  10.8*  HCT 35.2*  --   --   --   --   --  32.8*  PLT 227  --   --   --   --   --  225  APTT  --  32  --   --   --   --   --   LABPROT  --  12.5  --   --   --   --   --   INR  --  0.9  --   --   --   --   --   HEPARINUNFRC  --   --   --   --   --  0.26* 0.37  CREATININE 1.60*  --   --   --   --   --   --   TROPONINIHS 93*  --  131* 145* 149*  --   --     Estimated Creatinine Clearance: 40 mL/min (A) (by C-G formula based on SCr of 1.6 mg/dL  (H)).   Medical History: Past Medical History:  Diagnosis Date  . Anemia   . Arthritis   . Atrioventricular canal (AVC)    irregular heart beats  . Barrett esophagus   . 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 Chronic diastolic CHF (congestive heart failure) ([place])   . Colon polyp   . Diabetes mellitus without complication ([place])   . Diverticulosis   . Gout   . Heart disease   . Hemangioma    liver  . Hyperlipidemia   . Hypertension   . Myocardial infarct ([place])   . Ocular hypertension   . Peripheral vascular disease ([place])   . Skin cancer   . Sleep apnea   . Vitreoretinal degeneration     Assessment: Patient arrives w/ CP w/ initial trops of 93 >> 125, EKG w/ nonspecific intraventricular conduction delay. No anticoagulation PTA. Patient is being started on heparin drip for NSTEMI. Baseline  CBC WNL for patient.  Cardiology recommending 24 to 48 hours of heparin.  Goal of Therapy:  Heparin level 0.3-0.7 units/ml Monitor platelets by anticoagulation protocol: Yes   Plan:  1/4 1619 HL 0.26 subtherapeutic. Heparin 1200 unit bolus followed by increase in heparin drip to 1200 units/hr. HL 1/5 at 0200. CBC with morning labs. 1/5 0209 HL 0.37, therapeutic.  Continue current rate and recheck HL in 8 hours to confirm.  Hart Robinsons, PharmD Clinical Pharmacist  12/15/2019,3:26 AM

## 2019-12-15 NOTE — Progress Notes (Signed)
PROGRESS NOTE    Mike Decker  BUL:[id number] DOB: [birth year] DOA: 12/14/2019 PCP: Cletis Athens, MD      Assessment & Plan:   Principal Problem:   Chest pain Active Problems:   Essential hypertension   Hyperlipidemia   NSTEMI (non-ST elevated myocardial infarction) (HCC)   CAD (coronary artery disease)   History of stroke   Chronic diastolic heart failure (HCC)   CKD (chronic kidney disease) stage 4, GFR 15-29 ml/min (HCC)   COPD (chronic obstructive pulmonary disease) (HCC)   Gout   OSA (obstructive sleep apnea)   Type II diabetes mellitus with renal manifestations (HCC)   Chest pain: w/ elevated trop and hx of CAD. S/p of stent placement. Continue on IV heparin. Morphine, nitro prn for pain. Continue on aspirin, imdur, statin. Echo ordered. Continue on tele. Cardio consulted   Chronic diastolic heart failure: 2D echo on 11/15/2018 showed EF of 55-60%. Continue on IV lasix. Monitor I/Os. Echo pending   HTN: continue on home dose of amlodipine, losartan, metoprolol. Hydralazine prn  Hyperlipidemia: continue on statin   History of stroke: continue on plavix and crestor  CKDIV: Stable.  Baseline creatinine 1.6-2.0.  W/in baseline range. Will continue to monitor   COPD: Continue bronchodilators, Singulair, as needed Mucinex  Gout: continue home allopurinol and colchicine prn  DM2: Last A1c 7.2. Continue on lantus, novolog & SSI w/ accuchecks   OSA: continue on CPAP  Leukocytosis: likely reactive. Will continue to monitor    DVT prophylaxis: IV heparin  Code Status: full  Family Communication:  Disposition Plan:   Consultants:   Cardio    Procedures: n/a   Antimicrobials:    Subjective: Pt c/o cough   Objective: Vitals:   12/15/19 0520 12/15/19 0803 12/15/19 0951 12/15/19 1400  BP: (!) 140/50  (!) 157/57 (!) 150/74  Pulse: (!) 57  78 78  Resp: 14   18  Temp:    98.7 F (37.1 C)  TempSrc:      SpO2: 97% 98%    Weight:       Height:        Intake/Output Summary (Last 24 hours) at 12/15/2019 1719 Last data filed at 12/15/2019 1300 Gross per 24 hour  Intake 493 ml  Output 1525 ml  Net -1032 ml   Filed Weights   12/14/19 0235  Weight: 93.4 kg    Examination:  General exam: Appears calm but uncomfortable  Respiratory system: course breath sounds Cardiovascular system: S1 & S2 +. No ,rubs, gallops or clicks.  Gastrointestinal system: Abdomen is nondistended, soft and nontender.  Normal bowel sounds heard. Central nervous system: Alert and oriented. Moves all 4 extremities. Psychiatry: Judgement and insight appear normal. Flat mood and affect     Data Reviewed: I have personally reviewed following labs and imaging studies  CBC: Recent Labs  Lab 12/14/19 0233 12/15/19 0209  WBC 10.7* 10.8*  HGB 12.2* 10.8*  HCT 35.2* 32.8*  MCV 87.3 92.7  PLT 227 321   Basic Metabolic Panel: Recent Labs  Lab 12/14/19 0233  NA 137  K 4.0  CL 105  CO2 21*  GLUCOSE 290*  BUN 42*  CREATININE 1.60*  CALCIUM 8.9   GFR: Estimated Creatinine Clearance: 40 mL/min (A) (by C-G formula based on SCr of 1.6 mg/dL (H)). Liver Function Tests: No results for input(s): AST, ALT, ALKPHOS, BILITOT, PROT, ALBUMIN in the last 168 hours. No results for input(s): LIPASE, AMYLASE in the last 168 hours. No results for input(s):  AMMONIA in the last 168 hours. Coagulation Profile: Recent Labs  Lab 12/14/19 0752  INR 0.9   Cardiac Enzymes: No results for input(s): CKTOTAL, CKMB, CKMBINDEX, TROPONINI in the last 168 hours. BNP (last 3 results) No results for input(s): PROBNP in the last 8760 hours. HbA1C: Recent Labs    12/14/19 0934 12/15/19 0209  HGBA1C 8.5* 8.7*   CBG: Recent Labs  Lab 12/15/19 0005 12/15/19 0416 12/15/19 0916 12/15/19 1200 12/15/19 1705  GLUCAP 188* 153* 151* 187* 194*   Lipid Profile: Recent Labs    12/15/19 0209  CHOL 107  HDL 22*  LDLCALC 33  TRIG 260*  CHOLHDL 4.9   Thyroid  Function Tests: No results for input(s): TSH, T4TOTAL, FREET4, T3FREE, THYROIDAB in the last 72 hours. Anemia Panel: No results for input(s): VITAMINB12, FOLATE, FERRITIN, TIBC, IRON, RETICCTPCT in the last 72 hours. Sepsis Labs: No results for input(s): PROCALCITON, LATICACIDVEN in the last 168 hours.  Recent Results (from the past 240 hour(s))  Respiratory Panel by RT PCR (Flu A&B, Covid) - Nasopharyngeal Swab     Status: None   Collection Time: 12/14/19  7:52 AM   Specimen: Nasopharyngeal Swab  Result Value Ref Range Status   SARS Coronavirus 2 by RT PCR NEGATIVE NEGATIVE Final    Comment: (NOTE) SARS-CoV-2 target nucleic acids are NOT DETECTED. The SARS-CoV-2 RNA is generally detectable in upper respiratoy specimens during the acute phase of infection. The lowest concentration of SARS-CoV-2 viral copies this assay can detect is 131 copies/mL. A negative result does not preclude SARS-Cov-2 infection and should not be used as the sole basis for treatment or other patient management decisions. A negative result may occur with  improper specimen collection/handling, submission of specimen other than nasopharyngeal swab, presence of viral mutation(s) within the areas targeted by this assay, and inadequate number of viral copies (<131 copies/mL). A negative result must be combined with clinical observations, patient history, and epidemiological information. The expected result is Negative. Fact Sheet for Patients:  PinkCheek.be Fact Sheet for Healthcare Providers:  GravelBags.it This test is not yet ap proved or cleared by the [place] FDA and  has been authorized for detection and/or diagnosis of SARS-CoV-2 by FDA under an Emergency Use Authorization (EUA). This EUA will remain  in effect (meaning this test can be used) for the duration of the COVID-19 declaration under Section 564(b)(1) of the Act, 21 U.S.C. section  360bbb-3(b)(1), unless the authorization is terminated or revoked sooner.    Influenza A by PCR NEGATIVE NEGATIVE Final   Influenza B by PCR NEGATIVE NEGATIVE Final    Comment: (NOTE) The Xpert Xpress SARS-CoV-2/FLU/RSV assay is intended as an aid in  the diagnosis of influenza from Nasopharyngeal swab specimens and  should not be used as a sole basis for treatment. Nasal washings and  aspirates are unacceptable for Xpert Xpress SARS-CoV-2/FLU/RSV  testing. Fact Sheet for Patients: PinkCheek.be Fact Sheet for Healthcare Providers: GravelBags.it This test is not yet approved or cleared by the [place] FDA and  has been authorized for detection and/or diagnosis of SARS-CoV-2 by  FDA under an Emergency Use Authorization (EUA). This EUA will remain  in effect (meaning this test can be used) for the duration of the  Covid-19 declaration under Section 564(b)(1) of the Act, 21  U.S.C. section 360bbb-3(b)(1), unless the authorization is  terminated or revoked. Performed at [facility], [address], [place], [place] [address]          Radiology Studies:  DG Chest 2 View  Result Date: 12/14/2019 CLINICAL DATA:  Chest pain EXAM: CHEST - 2 VIEW COMPARISON:  11/24/2019, 06/10/2019 FINDINGS: No consolidation or pleural effusion. Mild coarse bronchitic changes. Stable cardiomediastinal silhouette with borderline cardiomegaly. No pneumothorax. IMPRESSION: Low lung volumes with coarse bronchitic changes. No consolidative airspace disease. Borderline to mild cardiomegaly. Electronically Signed   By: Donavan Foil M.D.   On: 12/14/2019 03:18   ECHOCARDIOGRAM COMPLETE  Result Date: 12/15/2019   ECHOCARDIOGRAM REPORT   Patient Name:   JARRICK FJELD Date of Exam: 12/14/2019 Medical Rec #:  [id number]        Height:       66.0 in Accession #:    [id number]       Weight:       206.0 lb Date of Birth:  [birth year]        BSA:           2.03 m Patient Age:    17 years         BP:           130/57 mmHg Patient Gender: M                HR:           68 bpm. Exam Location:  [facility] Procedure: 2D Echo, Color Doppler and Cardiac Doppler Indications:     Chest pain 786.50  History:         Patient has prior history of Echocardiogram examinations, most                  recent 11/15/2018. Risk Factors:Sleep Apnea, Hypertension and                  Diabetes. Mi, PVD.  Sonographer:     Sherrie Sport RDCS (AE) Referring Phys:  [id number] Soledad Gerlach NIU Diagnosing Phys: Yolonda Kida MD  Sonographer Comments: Suboptimal parasternal window. IMPRESSIONS  1. Left ventricular ejection fraction, by visual estimation, is 50 to 55%. The left ventricle has normal function. Normal left ventricular posterior wall thickness. There is no left ventricular hypertrophy.  2. The left ventricle has no regional wall motion abnormalities.  3. Global right ventricle has normal systolic function.The right ventricular size is normal. No increase in right ventricular wall thickness.  4. Left atrial size was normal.  5. Right atrial size was normal.  6. The mitral valve is normal in structure. Mild mitral valve regurgitation.  7. The tricuspid valve is not well visualized.  8. The aortic valve is grossly normal. Aortic valve regurgitation is not visualized. Mild aortic valve sclerosis without stenosis.  9. The pulmonic valve was normal in structure. Pulmonic valve regurgitation is not visualized. 10. Mildly elevated pulmonary artery systolic pressure. FINDINGS  Left Ventricle: Left ventricular ejection fraction, by visual estimation, is 50 to 55%. The left ventricle has normal function. The left ventricle has no regional wall motion abnormalities. Normal left ventricular posterior wall thickness. There is no left ventricular hypertrophy. Right Ventricle: The right ventricular size is normal. No increase in right ventricular wall thickness. Global RV systolic function is has normal systolic  function. The tricuspid regurgitant velocity is 2.36 m/s, and with an assumed right atrial pressure  of 10 mmHg, the estimated right ventricular systolic pressure is mildly elevated at 32.3 mmHg. Left Atrium: Left atrial size was normal in size. Right Atrium: Right atrial size was normal in size Pericardium: There is no evidence of pericardial effusion. Mitral Valve: The mitral  valve is normal in structure. Mild mitral valve regurgitation. Tricuspid Valve: The tricuspid valve is not well visualized. Tricuspid valve regurgitation is trivial. Aortic Valve: The aortic valve is grossly normal. Aortic valve regurgitation is not visualized. Mild aortic valve sclerosis is present, with no evidence of aortic valve stenosis. Aortic valve mean gradient measures 11.3 mmHg. Aortic valve peak gradient measures 20.8 mmHg. Aortic valve area, by VTI measures 1.19 cm. Pulmonic Valve: The pulmonic valve was normal in structure. Pulmonic valve regurgitation is not visualized. Pulmonic regurgitation is not visualized. Aorta: The aortic root is normal in size and structure. IAS/Shunts: No atrial level shunt detected by color flow Doppler.  LEFT VENTRICLE PLAX 2D LVIDd:         4.05 cm LVIDs:         2.98 cm LV PW:         1.25 cm LV IVS:        1.80 cm LVOT diam:     2.00 cm LV SV:         38 ml LV SV Index:   17.76 LVOT Area:     3.14 cm  RIGHT VENTRICLE RV Basal diam:  3.73 cm RV S prime:     12.40 cm/s TAPSE (M-mode): 3.4 cm LEFT ATRIUM             Index       RIGHT ATRIUM           Index LA diam:        4.00 cm 1.98 cm/m  RA Area:     18.30 cm LA Vol (A2C):   56.6 ml 27.95 ml/m RA Volume:   52.10 ml  25.73 ml/m LA Vol (A4C):   68.0 ml 33.58 ml/m LA Biplane Vol: 65.6 ml 32.39 ml/m  AORTIC VALVE                    PULMONIC VALVE AV Area (Vmax):    1.12 cm     PV Vmax:        0.98 m/s AV Area (Vmean):   1.18 cm     PV Peak grad:   3.8 mmHg AV Area (VTI):     1.19 cm     RVOT Peak grad: 6 mmHg AV Vmax:           228.00 cm/s  AV Vmean:          152.000 cm/s AV VTI:            0.448 m AV Peak Grad:      20.8 mmHg AV Mean Grad:      11.3 mmHg LVOT Vmax:         81.30 cm/s LVOT Vmean:        57.000 cm/s LVOT VTI:          0.170 m LVOT/AV VTI ratio: 0.38  AORTA Ao Root diam: 2.90 cm MITRAL VALVE                       TRICUSPID VALVE MV Area (PHT): 1.12 cm            TR Peak grad:   22.3 mmHg MV PHT:        196.62 msec         TR Vmax:        236.00 cm/s MV Decel Time: 678 msec MV E velocity: 96.25 cm/s 103 cm/s SHUNTS  Systemic VTI:  0.17 m                                    Systemic Diam: 2.00 cm  Yolonda Kida MD Electronically signed by Yolonda Kida MD Signature Date/Time: 12/15/2019/11:31:19 AM    Final         Scheduled Meds: . allopurinol  100 mg Oral Daily  . amLODipine  5 mg Oral Daily  . vitamin C  250 mg Oral Daily  . aspirin EC  81 mg Oral Daily  . citalopram  10 mg Oral Daily  . clopidogrel  75 mg Oral Daily  . dextromethorphan-guaiFENesin  1 tablet Oral BID  . docusate sodium  100 mg Oral BID  . ferrous sulfate  325 mg Oral Daily  . fluticasone  2 spray Each Nare Daily  . folic acid  624 mcg Oral Daily  . furosemide  40 mg Intravenous Daily  . insulin aspart  0-9 Units Subcutaneous Q4H  . insulin glargine  50 Units Subcutaneous QHS  . ipratropium-albuterol  3 mL Nebulization Q6H  . ipratropium-albuterol      . isosorbide mononitrate  60 mg Oral Daily  . loratadine  10 mg Oral Daily  . losartan  50 mg Oral Daily  . magnesium oxide  400 mg Oral Daily  . methocarbamol  500 mg Oral BID  . metoprolol tartrate  75 mg Oral BID  . mometasone-formoterol  2 puff Inhalation BID  . montelukast  10 mg Oral QHS  . omega-3 acid ethyl esters  1 g Oral Daily  . pantoprazole  40 mg Oral BID  . polyethylene glycol  17 g Oral Daily  . rosuvastatin  40 mg Oral QPM  . zinc sulfate  220 mg Oral Daily   Continuous Infusions: . heparin 1,200 Units/hr (12/15/19 0125)      LOS: 0 days    Time spent: 33 mins    Wyvonnia Dusky, MD [facility] Pager [phone]  If 7PM-7AM, please contact night-coverage [email or web address] Password [facility] 12/15/2019, 5:19 PM

## 2019-12-15 NOTE — OR Nursing (Signed)
Pt. [facility]. Pt. In NAD with VSS.

## 2019-12-16 ENCOUNTER — Inpatient Hospital Stay: Payer: Medicare Other

## 2019-12-16 DIAGNOSIS — E785 Hyperlipidemia, unspecified: Secondary | ICD-10-CM

## 2019-12-16 DIAGNOSIS — Z8673 Personal history of transient ischemic attack (TIA), and cerebral infarction without residual deficits: Secondary | ICD-10-CM

## 2019-12-16 DIAGNOSIS — I2583 Coronary atherosclerosis due to lipid rich plaque: Secondary | ICD-10-CM

## 2019-12-16 DIAGNOSIS — E1129 Type 2 diabetes mellitus with other diabetic kidney complication: Secondary | ICD-10-CM

## 2019-12-16 DIAGNOSIS — G4733 Obstructive sleep apnea (adult) (pediatric): Secondary | ICD-10-CM

## 2019-12-16 DIAGNOSIS — I2510 Atherosclerotic heart disease of native coronary artery without angina pectoris: Secondary | ICD-10-CM

## 2019-12-16 LAB — CBC
HCT: 35.7 % — ABNORMAL LOW (ref 39.0–52.0)
Hemoglobin: 11.4 g/dL — ABNORMAL LOW (ref 13.0–17.0)
MCH: 29.8 pg (ref 26.0–34.0)
MCHC: 31.9 g/dL (ref 30.0–36.0)
MCV: 93.2 fL (ref 80.0–100.0)
Platelets: 235 10*3/uL (ref 150–400)
RBC: 3.83 MIL/uL — ABNORMAL LOW (ref 4.22–5.81)
RDW: 14.3 % (ref 11.5–15.5)
WBC: 12.3 10*3/uL — ABNORMAL HIGH (ref 4.0–10.5)
nRBC: 0 % (ref 0.0–0.2)

## 2019-12-16 LAB — BASIC METABOLIC PANEL
Anion gap: 9 (ref 5–15)
BUN: 41 mg/dL — ABNORMAL HIGH (ref 8–23)
CO2: 24 mmol/L (ref 22–32)
Calcium: 8.7 mg/dL — ABNORMAL LOW (ref 8.9–10.3)
Chloride: 102 mmol/L (ref 98–111)
Creatinine, Ser: 1.82 mg/dL — ABNORMAL HIGH (ref 0.61–1.24)
GFR calc Af Amer: 40 mL/min — ABNORMAL LOW (ref >60)
GFR calc non Af Amer: 35 mL/min — ABNORMAL LOW (ref >60)
Glucose, Bld: 161 mg/dL — ABNORMAL HIGH (ref 70–99)
Potassium: 3.9 mmol/L (ref 3.5–5.1)
Sodium: 135 mmol/L (ref 135–145)

## 2019-12-16 LAB — GLUCOSE, CAPILLARY
Glucose-Capillary: 134 mg/dL — ABNORMAL HIGH (ref 70–99)
Glucose-Capillary: 150 mg/dL — ABNORMAL HIGH (ref 70–99)
Glucose-Capillary: 156 mg/dL — ABNORMAL HIGH (ref 70–99)
Glucose-Capillary: 157 mg/dL — ABNORMAL HIGH (ref 70–99)
Glucose-Capillary: 158 mg/dL — ABNORMAL HIGH (ref 70–99)
Glucose-Capillary: 175 mg/dL — ABNORMAL HIGH (ref 70–99)
Glucose-Capillary: 188 mg/dL — ABNORMAL HIGH (ref 70–99)
Glucose-Capillary: 209 mg/dL — ABNORMAL HIGH (ref 70–99)

## 2019-12-16 LAB — BRAIN NATRIURETIC PEPTIDE: B Natriuretic Peptide: 174 pg/mL — ABNORMAL HIGH (ref 0.0–100.0)

## 2019-12-16 LAB — PROCALCITONIN: Procalcitonin: <0.1 ng/mL

## 2019-12-16 MED ORDER — ACETAMINOPHEN 325 MG PO TABS: 650.0000 mg | ORAL_TABLET | Freq: Four times a day (QID) | ORAL | Status: DC | PRN

## 2019-12-16 MED ORDER — IOHEXOL 9 MG/ML PO SOLN: 500.0000 mL | ORAL | Status: DC

## 2019-12-16 MED ORDER — ENOXAPARIN SODIUM 40 MG/0.4ML ~~LOC~~ SOLN
40.0000 mg | SUBCUTANEOUS | Status: DC
  Administered 2019-12-16: 21:00:00 40 mg via SUBCUTANEOUS
  Filled 2019-12-16: qty 0.4

## 2019-12-16 MED ORDER — IOHEXOL 9 MG/ML PO SOLN
500.0000 mL | ORAL | Status: AC
  Administered 2019-12-16 (×2): 500 mL via ORAL

## 2019-12-16 MED ORDER — IOHEXOL 9 MG/ML PO SOLN: 500.0000 mL | Freq: Once | ORAL | Status: DC | PRN

## 2019-12-16 MED ORDER — SACUBITRIL-VALSARTAN 24-26 MG PO TABS
1.0000 | ORAL_TABLET | Freq: Two times a day (BID) | ORAL | Status: DC
  Administered 2019-12-16 – 2019-12-17 (×2): 1 via ORAL
  Filled 2019-12-16 (×3): qty 1

## 2019-12-16 MED ORDER — SIMETHICONE 80 MG PO CHEW
160.0000 mg | CHEWABLE_TABLET | Freq: Four times a day (QID) | ORAL | Status: DC | PRN
  Filled 2019-12-16 (×2): qty 2

## 2019-12-16 NOTE — Progress Notes (Signed)
Called regarding patient complaining of LUQ abdominal pain after having bowel movement of which he needed miralax and colace prior secondary to constipation.  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 and had his last eval in 09/2019 and had no Barretts.   Earlier nurse reported patient very upset as he had to wait for my bedside eval but during my interaction, he was calm, cooperative and grateful.  Abdomen protruberant but at patient baselin, Pain in LUQ with palpation but not evidence of severe and no rebound.  The LUQ tenderness spanned over to left lateral torso (he does also have history of urinary stones) Patient initially reused any contrast due to impaired renal function.  He then agreed to Oral contrast and CT abdomen and pelvis has been ordered.  With lack of IV dye with CT, will order ultrasound to eval for stones.

## 2019-12-16 NOTE — Progress Notes (Signed)
PROGRESS NOTE    Mike Decker  DDU:[id number] DOB: 03/15/1941 DOA: 12/14/2019 PCP: Cletis Athens, MD   Brief Narrative:  79 year old with history of CAD status post stent, HTN, HLD, DM2, COPD, GERD, depression, OSA, PVD, CKD stage IV, CVA presented to the hospital with chest pain.  Diagnosed with NSTEMI started on heparin drip and cardiology team was consulted.  Echocardiogram showed EF of 60%.  Cardiology recommended conservative management with outpatient follow-up.   Assessment & Plan:   Principal Problem:   Chest pain Active Problems:   Essential hypertension   Hyperlipidemia   NSTEMI (non-ST elevated myocardial infarction) (HCC)   CAD (coronary artery disease)   History of stroke   Chronic diastolic heart failure (HCC)   CKD (chronic kidney disease) stage 4, GFR 15-29 ml/min (HCC)   COPD (chronic obstructive pulmonary disease) (HCC)   Gout   OSA (obstructive sleep apnea)   Type II diabetes mellitus with renal manifestations (HCC)  Atypical chest pain, demand ischemia per cardiology -History of CAD status post stent placement -Seen by cardiology team, thinks this is more demand ischemia. -Echocardiogram-ejection fraction 55%  Acute on chronic congestive heart failure with preserved ejection fraction 55% -Continue IV Lasix.  Will check BNP -Monitor urine output. -Echocardiogram-ejection fraction 55% -We will obtain chest x-ray today.  Cough -Suspect from fluid overload versus heartburn with history of esophagitis.  Could also be ARB related? -Continue diuretic.  Will add PPI. -Continue bronchodilators  Nonspecific lower abdominal pain -Improved.  CT abdomen pelvis negative for any acute pathology. -Renal ultrasound-negative  Essential hypertension -Continue Norvasc 5 mg daily, IV Lasix twice daily, isosorbide mononitrate,Metoprolol, Entresto  Hyperlipidemia -Statin  History of COPD -Continue bronchodilators.  History of gout -Colchicine as needed home  allopurinol.  History of CVA -Plavix and Crestor  CKD stage IV -Creatinine baseline at 2.0.  Continue to monitor in setting of diuretics.  Diabetes mellitus type 2 -Hemoglobin A1c 7.2.  Continue insulin sliding scale and Accu-Chek  Obstructive sleep apnea -On CPAP  DVT prophylaxis: Heparin drip Code Status: Full code Family Communication:   Disposition Plan: Still having some exertional dyspnea and not feeling at baseline.  Continuing hospital stay for IV diuretics today.  Cardiology team following.  Consultants:   Cardiology  Procedures:    None Antimicrobials:   None   Subjective: Still has some cough and exertional dyspnea.  Does not feel like he is ready to go home  Overnight had lower abdominal pain for which he received CT of the abdomen pelvis which was negative and renal ultrasound which was negative as well.  Review of Systems Otherwise negative except as per HPI, including: General: Denies fever, chills, night sweats or unintended weight loss. Resp: Denies cough, wheezing, shortness of breath. Cardiac: Denies chest pain, palpitations, orthopnea, paroxysmal nocturnal dyspnea. GI: Denies  nausea, vomiting, diarrhea or constipation GU: Denies dysuria, frequency, hesitancy or incontinence MS: Denies muscle aches, joint pain or swelling Neuro: Denies headache, neurologic deficits (focal weakness, numbness, tingling), abnormal gait Psych: Denies anxiety, depression, SI/HI/AVH Skin: Denies new rashes or lesions ID: Denies sick contacts, exotic exposures, travel  Objective: Vitals:   12/15/19 2311 12/16/19 0010 12/16/19 0321 12/16/19 0822  BP: (!) 195/66 (!) 156/77 (!) 172/64 (!) 182/68  Pulse: 71 (!) 59 66 85  Resp: 20  20 20   Temp: 98.2 F (36.8 C)  98.4 F (36.9 C) (!) 97.4 F (36.3 C)  TempSrc: Oral  Oral Oral  SpO2: 99% 98% 97% 98%  Weight:  Height:        Intake/Output Summary (Last 24 hours) at 12/16/2019 0834 Last data filed at 12/15/2019  2200 Gross per 24 hour  Intake 490 ml  Output 2350 ml  Net -1860 ml   Filed Weights   12/14/19 0235  Weight: 93.4 kg    Examination:  General exam: Appears calm and comfortable  Respiratory system: Bibasilar crackles Cardiovascular system: S1 & S2 heard, RRR. No JVD, murmurs, rubs, gallops or clicks. No pedal edema. Gastrointestinal system: Abdomen is nondistended, soft and nontender. No organomegaly or masses felt. Normal bowel sounds heard. Central nervous system: Alert and oriented. No focal neurological deficits. Extremities: Symmetric 5 x 5 power. Skin: No rashes, lesions or ulcers Psychiatry: Judgement and insight appear normal. Mood & affect appropriate.     Data Reviewed:   CBC: Recent Labs  Lab 12/14/19 0233 12/15/19 0209 12/16/19 0633  WBC 10.7* 10.8* 12.3*  HGB 12.2* 10.8* 11.4*  HCT 35.2* 32.8* 35.7*  MCV 87.3 92.7 93.2  PLT 227 225 937   Basic Metabolic Panel: Recent Labs  Lab 12/14/19 0233 12/16/19 0633  NA 137 135  K 4.0 3.9  CL 105 102  CO2 21* 24  GLUCOSE 290* 161*  BUN 42* 41*  CREATININE 1.60* 1.82*  CALCIUM 8.9 8.7*   GFR: Estimated Creatinine Clearance: 35.2 mL/min (A) (by C-G formula based on SCr of 1.82 mg/dL (H)). Liver Function Tests: No results for input(s): AST, ALT, ALKPHOS, BILITOT, PROT, ALBUMIN in the last 168 hours. No results for input(s): LIPASE, AMYLASE in the last 168 hours. No results for input(s): AMMONIA in the last 168 hours. Coagulation Profile: Recent Labs  Lab 12/14/19 0752  INR 0.9   Cardiac Enzymes: No results for input(s): CKTOTAL, CKMB, CKMBINDEX, TROPONINI in the last 168 hours. BNP (last 3 results) No results for input(s): PROBNP in the last 8760 hours. HbA1C: Recent Labs    12/14/19 0934 12/15/19 0209  HGBA1C 8.5* 8.7*   CBG: Recent Labs  Lab 12/15/19 1705 12/15/19 1953 12/16/19 0009 12/16/19 0557 12/16/19 0800  GLUCAP 194* 246* 188* 156* 158*   Lipid Profile: Recent Labs     12/15/19 0209  CHOL 107  HDL 22*  LDLCALC 33  TRIG 260*  CHOLHDL 4.9   Thyroid Function Tests: No results for input(s): TSH, T4TOTAL, FREET4, T3FREE, THYROIDAB in the last 72 hours. Anemia Panel: No results for input(s): VITAMINB12, FOLATE, FERRITIN, TIBC, IRON, RETICCTPCT in the last 72 hours. Sepsis Labs: No results for input(s): PROCALCITON, LATICACIDVEN in the last 168 hours.  Recent Results (from the past 240 hour(s))  Respiratory Panel by RT PCR (Flu A&B, Covid) - Nasopharyngeal Swab     Status: None   Collection Time: 12/14/19  7:52 AM   Specimen: Nasopharyngeal Swab  Result Value Ref Range Status   SARS Coronavirus 2 by RT PCR NEGATIVE NEGATIVE Final    Comment: (NOTE) SARS-CoV-2 target nucleic acids are NOT DETECTED. The SARS-CoV-2 RNA is generally detectable in upper respiratoy specimens during the acute phase of infection. The lowest concentration of SARS-CoV-2 viral copies this assay can detect is 131 copies/mL. A negative result does not preclude SARS-Cov-2 infection and should not be used as the sole basis for treatment or other patient management decisions. A negative result may occur with  improper specimen collection/handling, submission of specimen other than nasopharyngeal swab, presence of viral mutation(s) within the areas targeted by this assay, and inadequate number of viral copies (<131 copies/mL). A negative result must be combined  with clinical observations, patient history, and epidemiological information. The expected result is Negative. Fact Sheet for Patients:  PinkCheek.be Fact Sheet for Healthcare Providers:  GravelBags.it This test is not yet ap proved or cleared by the [place] FDA and  has been authorized for detection and/or diagnosis of SARS-CoV-2 by FDA under an Emergency Use Authorization (EUA). This EUA will remain  in effect (meaning this test can be used) for the duration  of the COVID-19 declaration under Section 564(b)(1) of the Act, 21 U.S.C. section 360bbb-3(b)(1), unless the authorization is terminated or revoked sooner.    Influenza A by PCR NEGATIVE NEGATIVE Final   Influenza B by PCR NEGATIVE NEGATIVE Final    Comment: (NOTE) The Xpert Xpress SARS-CoV-2/FLU/RSV assay is intended as an aid in  the diagnosis of influenza from Nasopharyngeal swab specimens and  should not be used as a sole basis for treatment. Nasal washings and  aspirates are unacceptable for Xpert Xpress SARS-CoV-2/FLU/RSV  testing. Fact Sheet for Patients: PinkCheek.be Fact Sheet for Healthcare Providers: GravelBags.it This test is not yet approved or cleared by the [place] FDA and  has been authorized for detection and/or diagnosis of SARS-CoV-2 by  FDA under an Emergency Use Authorization (EUA). This EUA will remain  in effect (meaning this test can be used) for the duration of the  Covid-19 declaration under Section 564(b)(1) of the Act, 21  U.S.C. section 360bbb-3(b)(1), unless the authorization is  terminated or revoked. Performed at [facility], [address] [place], [place] [address]          Radiology Studies: CT ABDOMEN PELVIS WO CONTRAST  Result Date: 12/16/2019 CLINICAL DATA:  Diverticulitis suspected. Left upper quadrant and around movement EXAM: CT ABDOMEN AND PELVIS WITHOUT CONTRAST TECHNIQUE: Multidetector CT imaging of the abdomen and pelvis was performed following the standard protocol without IV contrast. COMPARISON:  05/07/2017 FINDINGS: Lower chest:  No contributory findings. Hepatobiliary: Subtle low-density lesion on the upper right liver, hemangioma based on previous imaging/reporting.No evidence of biliary obstruction or stone. Pancreas: Unremarkable. Spleen: Unremarkable. Adrenals/Urinary Tract: Negative adrenals. No hydronephrosis or stone. Unremarkable bladder.  Stomach/Bowel: No obstruction. No appendicitis. Sigmoid diverticulosis. No diverticulitis noted. Vascular/Lymphatic: No acute vascular abnormality. 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 No mass or adenopathy. Reproductive:Prostatectomy without worrisome nodularity. Other: No ascites or pneumoperitoneum. Fatty right inguinal hernia. Left inguinal hernia repair. Musculoskeletal: No acute abnormalities. IMPRESSION: 1. No acute finding including evidence of diverticulitis 2. Fatty right inguinal hernia. 3. Left colonic diverticulosis. Electronically Signed   By: Monte Fantasia M.D.   On: 12/16/2019 08:00   ECHOCARDIOGRAM COMPLETE  Result Date: 12/15/2019   ECHOCARDIOGRAM REPORT   Patient Name:   Mike Decker Date of Exam: 12/14/2019 Medical Rec #:  [id number]        Height:       66.0 in Accession #:    [id number]       Weight:       206.0 lb Date of Birth:  11/25/1941        BSA:          2.03 m Patient Age:    12 years         BP:           130/57 mmHg Patient Gender: M                HR:           68 bpm. Exam Location:  [facility] Procedure: 2D Echo, Color Doppler and Cardiac Doppler Indications:     Chest pain 786.50  History:         Patient has prior history of Echocardiogram examinations, most                  recent 11/15/2018. Risk Factors:Sleep Apnea, Hypertension and                  Diabetes. Mi, PVD.  Sonographer:     Sherrie Sport RDCS (AE) Referring Phys:  [id number] Soledad Gerlach NIU Diagnosing Phys: Yolonda Kida MD  Sonographer Comments: Suboptimal parasternal window. IMPRESSIONS  1. Left ventricular ejection fraction, by visual estimation, is 50 to 55%. The left ventricle has normal function. Normal left ventricular posterior wall thickness. There is no left ventricular hypertrophy.  2. The left ventricle has no regional wall motion abnormalities.  3. Global right ventricle has normal systolic function.The right ventricular size is normal. No increase in right  ventricular wall thickness.  4. Left atrial size was normal.  5. Right atrial size was normal.  6. The mitral valve is normal in structure. Mild mitral valve regurgitation.  7. The tricuspid valve is not well visualized.  8. The aortic valve is grossly normal. Aortic valve regurgitation is not visualized. Mild aortic valve sclerosis without stenosis.  9. The pulmonic valve was normal in structure. Pulmonic valve regurgitation is not visualized. 10. Mildly elevated pulmonary artery systolic pressure. FINDINGS  Left Ventricle: Left ventricular ejection fraction, by visual estimation, is 50 to 55%. The left ventricle has normal function. The left ventricle has no regional wall motion abnormalities. Normal left ventricular posterior wall thickness. There is no left ventricular hypertrophy. Right Ventricle: The right ventricular size is normal. No increase in right ventricular wall thickness. Global RV systolic function is has normal systolic function. The tricuspid regurgitant velocity is 2.36 m/s, and with an assumed right atrial pressure  of 10 mmHg, the estimated right ventricular systolic pressure is mildly elevated at 32.3 mmHg. Left Atrium: Left atrial size was normal in size. Right Atrium: Right atrial size was normal in size Pericardium: There is no evidence of pericardial effusion. Mitral Valve: The mitral valve is normal in structure. Mild mitral valve regurgitation. Tricuspid Valve: The tricuspid valve is not well visualized. Tricuspid valve regurgitation is trivial. Aortic Valve: The aortic valve is grossly normal. Aortic valve regurgitation is not visualized. Mild aortic valve sclerosis is present, with no evidence of aortic valve stenosis. Aortic valve mean gradient measures 11.3 mmHg. Aortic valve peak gradient measures 20.8 mmHg. Aortic valve area, by VTI measures 1.19 cm. Pulmonic Valve: The pulmonic valve was normal in structure. Pulmonic valve regurgitation is not visualized. Pulmonic regurgitation  is not visualized. Aorta: The aortic root is normal in size and structure. IAS/Shunts: No atrial level shunt detected by color flow Doppler.  LEFT VENTRICLE PLAX 2D LVIDd:         4.05 cm LVIDs:         2.98 cm LV PW:         1.25 cm LV IVS:        1.80 cm LVOT diam:     2.00 cm LV SV:         38 ml LV SV Index:   17.76 LVOT Area:     3.14 cm  RIGHT VENTRICLE RV Basal diam:  3.73 cm RV S prime:     12.40 cm/s TAPSE (M-mode): 3.4 cm LEFT ATRIUM  Index       RIGHT ATRIUM           Index LA diam:        4.00 cm 1.98 cm/m  RA Area:     18.30 cm LA Vol (A2C):   56.6 ml 27.95 ml/m RA Volume:   52.10 ml  25.73 ml/m LA Vol (A4C):   68.0 ml 33.58 ml/m LA Biplane Vol: 65.6 ml 32.39 ml/m  AORTIC VALVE                    PULMONIC VALVE AV Area (Vmax):    1.12 cm     PV Vmax:        0.98 m/s AV Area (Vmean):   1.18 cm     PV Peak grad:   3.8 mmHg AV Area (VTI):     1.19 cm     RVOT Peak grad: 6 mmHg AV Vmax:           228.00 cm/s AV Vmean:          152.000 cm/s AV VTI:            0.448 m AV Peak Grad:      20.8 mmHg AV Mean Grad:      11.3 mmHg LVOT Vmax:         81.30 cm/s LVOT Vmean:        57.000 cm/s LVOT VTI:          0.170 m LVOT/AV VTI ratio: 0.38  AORTA Ao Root diam: 2.90 cm MITRAL VALVE                       TRICUSPID VALVE MV Area (PHT): 1.12 cm            TR Peak grad:   22.3 mmHg MV PHT:        196.62 msec         TR Vmax:        236.00 cm/s MV Decel Time: 678 msec MV E velocity: 96.25 cm/s 103 cm/s SHUNTS                                    Systemic VTI:  0.17 m                                    Systemic Diam: 2.00 cm  Dwayne Prince Rome MD Electronically signed by Yolonda Kida MD Signature Date/Time: 12/15/2019/11:31:19 AM    Final         Scheduled Meds: . allopurinol  100 mg Oral Daily  . amLODipine  5 mg Oral Daily  . vitamin C  250 mg Oral Daily  . aspirin EC  81 mg Oral Daily  . citalopram  10 mg Oral Daily  . clopidogrel  75 mg Oral Daily  . dextromethorphan-guaiFENesin   1 tablet Oral BID  . docusate sodium  100 mg Oral BID  . ferrous sulfate  325 mg Oral Daily  . fluticasone  2 spray Each Nare Daily  . folic acid  425 mcg Oral Daily  . furosemide  40 mg Intravenous Daily  . hydrALAZINE  25 mg Oral Q8H  . insulin aspart  0-9 Units Subcutaneous Q4H  . insulin glargine  50 Units Subcutaneous QHS  . ipratropium-albuterol  3 mL Nebulization Q6H  . isosorbide mononitrate  60 mg Oral Daily  . loratadine  10 mg Oral Daily  . losartan  100 mg Oral Daily  . magnesium oxide  400 mg Oral Daily  . methocarbamol  500 mg Oral BID  . metoprolol tartrate  75 mg Oral BID  . mometasone-formoterol  2 puff Inhalation BID  . montelukast  10 mg Oral QHS  . omega-3 acid ethyl esters  1 g Oral Daily  . pantoprazole  40 mg Oral BID  . polyethylene glycol  17 g Oral Daily  . rosuvastatin  40 mg Oral QPM  . zinc sulfate  220 mg Oral Daily   Continuous Infusions:   LOS: 1 day   Time spent= 35 mins    Jaylaa Gallion Arsenio Loader, MD [facility]  If 7PM-7AM, please contact night-coverage  12/16/2019, 8:34 AM

## 2019-12-16 NOTE — Plan of Care (Signed)
  Problem: Clinical Measurements: Goal: Cardiovascular complication will be avoided Outcome: Progressing   Problem: Coping: Goal: Level of anxiety will decrease Outcome: Progressing   Problem: Safety: Goal: Ability to remain free from injury will improve Outcome: Progressing   

## 2019-12-16 NOTE — Progress Notes (Signed)
[facility]    SUBJECTIVE: Patient states she had a difficult night pump with constipation complains of weakness unsteadiness on his feet denies any chest pain he still feels short of breath and dyspnea on exertion no fever chills or sweats denies any palpitations or tachycardia.  Blood pressures has been moderately elevated.  We will make some changes in her medications at [facility] to help with heart failure as well as blood pressure control continue Imdur and hydralazine and metoprolol   Vitals:   12/16/19 0010 12/16/19 0321 12/16/19 0822 12/16/19 0841  BP: (!) 156/77 (!) 172/64 (!) 182/68 (!) 170/90  Pulse: (!) 59 66 85 85  Resp:  20 20   Temp:  98.4 F (36.9 C) (!) 97.4 F (36.3 C)   TempSrc:  Oral Oral   SpO2: 98% 97% 98% 98%  Weight:      Height:         Intake/Output Summary (Last 24 hours) at 12/16/2019 3748 Last data filed at 12/15/2019 2200 Gross per 24 hour  Intake 490 ml  Output 2350 ml  Net -1860 ml      PHYSICAL EXAM  General: Well developed, well nourished, in no acute distress HEENT:  Normocephalic and atramatic Neck:  No JVD.  Lungs: Clear bilaterally to auscultation and percussion. Heart: HRRR . Normal S1 and S2 without gallops or murmurs.  Abdomen: Bowel sounds are positive, abdomen soft and non-tender  Msk:  Back normal, normal gait. Normal strength and tone for age. Extremities: No clubbing, cyanosis or edema.   Neuro: Alert and oriented X 3. Psych:  Good affect, responds appropriately   LABS: Basic Metabolic Panel: Recent Labs    12/14/19 0233 12/16/19 0633  NA 137 135  K 4.0 3.9  CL 105 102  CO2 21* 24  GLUCOSE 290* 161*  BUN 42* 41*  CREATININE 1.60* 1.82*  CALCIUM 8.9 8.7*   Liver Function Tests: No results for input(s): AST, ALT, ALKPHOS, BILITOT, PROT, ALBUMIN in the last 72 hours. No results for input(s): LIPASE, AMYLASE in the last 72 hours. CBC: Recent Labs    12/15/19 0209 12/16/19 0633  WBC 10.8* 12.3*  HGB 10.8*  11.4*  HCT 32.8* 35.7*  MCV 92.7 93.2  PLT 225 235   Cardiac Enzymes: No results for input(s): CKTOTAL, CKMB, CKMBINDEX, TROPONINI in the last 72 hours. BNP: Invalid input(s): POCBNP D-Dimer: No results for input(s): DDIMER in the last 72 hours. Hemoglobin A1C: Recent Labs    12/15/19 0209  HGBA1C 8.7*   Fasting Lipid Panel: Recent Labs    12/15/19 0209  CHOL 107  HDL 22*  LDLCALC 33  TRIG 260*  CHOLHDL 4.9   Thyroid Function Tests: No results for input(s): TSH, T4TOTAL, T3FREE, THYROIDAB in the last 72 hours.  Invalid input(s): FREET3 Anemia Panel: No results for input(s): VITAMINB12, FOLATE, FERRITIN, TIBC, IRON, RETICCTPCT in the last 72 hours.  CT ABDOMEN PELVIS WO CONTRAST  Result Date: 12/16/2019 CLINICAL DATA:  Diverticulitis suspected. Left upper quadrant and around movement EXAM: CT ABDOMEN AND PELVIS WITHOUT CONTRAST TECHNIQUE: Multidetector CT imaging of the abdomen and pelvis was performed following the standard protocol without IV contrast. COMPARISON:  05/07/2017 FINDINGS: Lower chest:  No contributory findings. Hepatobiliary: Subtle low-density lesion on the upper right liver, hemangioma based on previous imaging/reporting.No evidence of biliary obstruction or stone. Pancreas: Unremarkable. Spleen: Unremarkable. Adrenals/Urinary Tract: Negative adrenals. No hydronephrosis or stone. Unremarkable bladder. Stomach/Bowel: No obstruction. No appendicitis. Sigmoid diverticulosis. No diverticulitis noted. Vascular/Lymphatic: No acute vascular  abnormality. 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 No mass or adenopathy. Reproductive:Prostatectomy without worrisome nodularity. Other: No ascites or pneumoperitoneum. Fatty right inguinal hernia. Left inguinal hernia repair. Musculoskeletal: No acute abnormalities. IMPRESSION: 1. No acute finding including evidence of diverticulitis 2. Fatty right inguinal hernia. 3. Left  colonic diverticulosis. Electronically Signed   By: Monte Fantasia M.D.   On: 12/16/2019 08:00   ECHOCARDIOGRAM COMPLETE  Result Date: 12/15/2019   ECHOCARDIOGRAM REPORT   Patient Name:   Mike Decker Date of Exam: 12/14/2019 Medical Rec #:  [id number]        Height:       66.0 in Accession #:    [id number]       Weight:       206.0 lb Date of Birth:  08/29/1941        BSA:          2.03 m Patient Age:    79 years         BP:           130/57 mmHg Patient Gender: M                HR:           68 bpm. Exam Location:  [facility] Procedure: 2D Echo, Color Doppler and Cardiac Doppler Indications:     Chest pain 786.50  History:         Patient has prior history of Echocardiogram examinations, most                  recent 11/15/2018. Risk Factors:Sleep Apnea, Hypertension and                  Diabetes. Mi, PVD.  Sonographer:     Sherrie Sport RDCS (AE) Referring Phys:  [id number] Soledad Gerlach NIU Diagnosing Phys: Yolonda Kida MD  Sonographer Comments: Suboptimal parasternal window. IMPRESSIONS  1. Left ventricular ejection fraction, by visual estimation, is 50 to 55%. The left ventricle has normal function. Normal left ventricular posterior wall thickness. There is no left ventricular hypertrophy.  2. The left ventricle has no regional wall motion abnormalities.  3. Global right ventricle has normal systolic function.The right ventricular size is normal. No increase in right ventricular wall thickness.  4. Left atrial size was normal.  5. Right atrial size was normal.  6. The mitral valve is normal in structure. Mild mitral valve regurgitation.  7. The tricuspid valve is not well visualized.  8. The aortic valve is grossly normal. Aortic valve regurgitation is not visualized. Mild aortic valve sclerosis without stenosis.  9. The pulmonic valve was normal in structure. Pulmonic valve regurgitation is not visualized. 10. Mildly elevated pulmonary artery systolic pressure. FINDINGS  Left Ventricle: Left ventricular ejection fraction,  by visual estimation, is 50 to 55%. The left ventricle has normal function. The left ventricle has no regional wall motion abnormalities. Normal left ventricular posterior wall thickness. There is no left ventricular hypertrophy. Right Ventricle: The right ventricular size is normal. No increase in right ventricular wall thickness. Global RV systolic function is has normal systolic function. The tricuspid regurgitant velocity is 2.36 m/s, and with an assumed right atrial pressure  of 10 mmHg, the estimated right ventricular systolic pressure is mildly elevated at 32.3 mmHg. Left Atrium: Left atrial size was normal in size. Right Atrium: Right atrial size was normal in size Pericardium: There is no evidence of pericardial effusion. Mitral Valve: The mitral  valve is normal in structure. Mild mitral valve regurgitation. Tricuspid Valve: The tricuspid valve is not well visualized. Tricuspid valve regurgitation is trivial. Aortic Valve: The aortic valve is grossly normal. Aortic valve regurgitation is not visualized. Mild aortic valve sclerosis is present, with no evidence of aortic valve stenosis. Aortic valve mean gradient measures 11.3 mmHg. Aortic valve peak gradient measures 20.8 mmHg. Aortic valve area, by VTI measures 1.19 cm. Pulmonic Valve: The pulmonic valve was normal in structure. Pulmonic valve regurgitation is not visualized. Pulmonic regurgitation is not visualized. Aorta: The aortic root is normal in size and structure. IAS/Shunts: No atrial level shunt detected by color flow Doppler.  LEFT VENTRICLE PLAX 2D LVIDd:         4.05 cm LVIDs:         2.98 cm LV PW:         1.25 cm LV IVS:        1.80 cm LVOT diam:     2.00 cm LV SV:         38 ml LV SV Index:   17.76 LVOT Area:     3.14 cm  RIGHT VENTRICLE RV Basal diam:  3.73 cm RV S prime:     12.40 cm/s TAPSE (M-mode): 3.4 cm LEFT ATRIUM             Index       RIGHT ATRIUM           Index LA diam:        4.00 cm 1.98 cm/m  RA Area:     18.30 cm LA Vol  (A2C):   56.6 ml 27.95 ml/m RA Volume:   52.10 ml  25.73 ml/m LA Vol (A4C):   68.0 ml 33.58 ml/m LA Biplane Vol: 65.6 ml 32.39 ml/m  AORTIC VALVE                    PULMONIC VALVE AV Area (Vmax):    1.12 cm     PV Vmax:        0.98 m/s AV Area (Vmean):   1.18 cm     PV Peak grad:   3.8 mmHg AV Area (VTI):     1.19 cm     RVOT Peak grad: 6 mmHg AV Vmax:           228.00 cm/s AV Vmean:          152.000 cm/s AV VTI:            0.448 m AV Peak Grad:      20.8 mmHg AV Mean Grad:      11.3 mmHg LVOT Vmax:         81.30 cm/s LVOT Vmean:        57.000 cm/s LVOT VTI:          0.170 m LVOT/AV VTI ratio: 0.38  AORTA Ao Root diam: 2.90 cm MITRAL VALVE                       TRICUSPID VALVE MV Area (PHT): 1.12 cm            TR Peak grad:   22.3 mmHg MV PHT:        196.62 msec         TR Vmax:        236.00 cm/s MV Decel Time: 678 msec MV E velocity: 96.25 cm/s 103 cm/s SHUNTS  Systemic VTI:  0.17 m                                    Systemic Diam: 2.00 cm  Yolonda Kida MD Electronically signed by Yolonda Kida MD Signature Date/Time: 12/15/2019/11:31:19 AM    Final      Echo normal LV function ejection fraction around 50 to 60%  TELEMETRY: Normal sinus rhythm nonspecific ST-T wave changes  ASSESSMENT AND PLAN:  Principal Problem:   Chest pain Active Problems:   Essential hypertension   Hyperlipidemia   NSTEMI (non-ST elevated myocardial infarction) (HCC)   CAD (coronary artery disease)   History of stroke   Chronic diastolic heart failure (HCC)   CKD (chronic kidney disease) stage 4, GFR 15-29 ml/min (HCC)   COPD (chronic obstructive pulmonary disease) (HCC)   Gout   OSA (obstructive sleep apnea)   Type II diabetes mellitus with renal manifestations (HCC)    Plan Recommend d/c Losartan and switch to Entresto 24/26 bid Continue Lasix as a diuretic Hypertension control with hydralazine / Entresto Inhalers for SOB COPD OSA continue CPAP CPAP for  obstructive sleep apnea management and control Chronic renal sufficiency stage III we will continue to follow-up with nephrology Obesity recommend weight loss exercise portion control Diabetes type 2 with renal involvement continue diabetes management Elevated troponin probably demand ischemia do not think this is a non-STEMI will treat medically Questionable atrial fibrillation no obvious documentation will hold off on long-term anticoagulation for now Consider physical therapy to help with strength training Continue aspirin Plavix do not recommend Eliquis at this point      Yolonda Kida, MD 12/16/2019 8:52 AM

## 2019-12-17 LAB — GLUCOSE, CAPILLARY
Glucose-Capillary: 66 mg/dL — ABNORMAL LOW (ref 70–99)
Glucose-Capillary: 75 mg/dL (ref 70–99)
Glucose-Capillary: 182 mg/dL — ABNORMAL HIGH (ref 70–99)

## 2019-12-17 MED ORDER — INSULIN GLARGINE 100 UNIT/ML ~~LOC~~ SOLN
45.0000 [IU] | Freq: Every day | SUBCUTANEOUS | Status: DC
  Filled 2019-12-17: qty 0.45

## 2019-12-17 MED ORDER — SACUBITRIL-VALSARTAN 24-26 MG PO TABS: 1.0000 | ORAL_TABLET | Freq: Two times a day (BID) | ORAL | 0 refills | Status: DC

## 2019-12-17 MED ORDER — INSULIN GLARGINE 100 UNIT/ML ~~LOC~~ SOLN: 45.0000 [IU] | Freq: Every day | SUBCUTANEOUS | 11 refills | Status: DC

## 2019-12-17 MED ORDER — IPRATROPIUM-ALBUTEROL 0.5-2.5 (3) MG/3ML IN SOLN
3.0000 mL | Freq: Three times a day (TID) | RESPIRATORY_TRACT | Status: DC
  Administered 2019-12-17 (×2): 3 mL via RESPIRATORY_TRACT
  Filled 2019-12-17: qty 3

## 2019-12-17 MED ORDER — PANTOPRAZOLE SODIUM 40 MG PO TBEC: 40.0000 mg | DELAYED_RELEASE_TABLET | Freq: Two times a day (BID) | ORAL | Status: DC

## 2019-12-17 MED ORDER — ASPIRIN 81 MG PO TBEC: 81.0000 mg | DELAYED_RELEASE_TABLET | Freq: Every day | ORAL | 0 refills | Status: DC

## 2019-12-17 MED ORDER — HYDRALAZINE HCL 25 MG PO TABS: 25.0000 mg | ORAL_TABLET | Freq: Three times a day (TID) | ORAL | 0 refills | Status: DC

## 2019-12-17 NOTE — Progress Notes (Signed)
Inpatient Diabetes Program Recommendations  AACE/ADA: New Consensus Statement on Inpatient Glycemic Control (2015)  Target Ranges:  Prepandial:   less than 140 mg/dL      Peak postprandial:   less than 180 mg/dL (1-2 hours)      Critically ill patients:  140 - 180 mg/dL   Lab Results  Component Value Date   GLUCAP 182 (H) 12/17/2019   HGBA1C 8.7 (H) 12/15/2019    Review of Glycemic Control Results for SAMARI, GORBY" (MRN [id number]) as of 12/17/2019 12:54  Ref. Range 12/16/2019 19:59 12/16/2019 23:51 12/17/2019 03:56 12/17/2019 07:26 12/17/2019 11:48  Glucose-Capillary Latest Ref Range: 70 - 99 mg/dL 209 (H) 134 (H) 66 (L) 75 182 (H)   Diabetes history: DM 2 Outpatient Diabetes medications:  Novolog 20-32 units tid with meals, Lantus 70 untis q HS Current orders for Inpatient glycemic control:  Lantus 50 units q HS Novolog sensitive q 4 hours  Inpatient Diabetes Program Recommendations:    Note low blood sugar this AM.  Please reduce Novolog correction to tid with meals and HS (instead of q 4 hours).  Also may need slight reduction in basal insulin. Consider reducing Lantus to 45 units q HS.   Thanks  Adah Perl, RN, BC-ADM Inpatient Diabetes Coordinator Pager [phone] (8a-5p)

## 2019-12-17 NOTE — Discharge Summary (Signed)
Physician Discharge Summary  Mike Decker PYK:[id number] DOB: [birth year] DOA: 12/14/2019  PCP: Cletis Athens, MD  Admit date: 12/14/2019 Discharge date: 12/17/2019  Admitted From: Home Disposition: Home  Recommendations for Outpatient Follow-up:  1. Follow up with PCP in 1-2 weeks 2. Please obtain BMP/CBC in one week your next doctors visit.  3. Follow-up outpatient cardiology in 1-2 weeks 4. Started on aspirin, Entresto and hydralazine.  Continue Plavix, Lasix 40 mg daily 5. Lantus reduced to 45 units at bedtime.  Protonix changed to twice daily before meals. 6. Losartan discontinued  Discharge Condition: Stable CODE STATUS: Full code Diet recommendation: Cardiac  Brief/Interim Summary:  79 year old with history of CAD status post stent, HTN, HLD, DM2, COPD, GERD, depression, OSA, PVD, CKD stage IV, CVA presented to the hospital with chest pain.  Diagnosed with NSTEMI started on heparin drip and cardiology team was consulted.  Echocardiogram showed EF of 60%.  Cardiology recommended conservative management with outpatient follow-up.  During the hospitalization cardiology thought elevated troponin was secondary to demand ischemia therefore heparin drip was stopped.  He was diuresed and his symptoms improved.  Medication adjustments were made per cardiology team, recommendations as stated above. Hospital course also complicated by complaining of lower abdominal pain for which CT abdomen pelvis was done and was negative.  Renal ultrasound was also negative. Stable for discharge today with outpatient follow-up.  Discharge Diagnoses:  Principal Problem:   Chest pain Active Problems:   Essential hypertension   Hyperlipidemia   NSTEMI (non-ST elevated myocardial infarction) (HCC)   CAD (coronary artery disease)   History of stroke   Chronic diastolic heart failure (HCC)   CKD (chronic kidney disease) stage 4, GFR 15-29 ml/min (HCC)   COPD (chronic obstructive pulmonary disease) (HCC)    Gout   OSA (obstructive sleep apnea)   Type II diabetes mellitus with renal manifestations (HCC)  Atypical chest pain, demand ischemia per cardiology -History of CAD status post stent placement -Seen by cardiology team, thinks this is more demand ischemia.  No further inpatient ischemic work-up.  Aspirin added to Plavix. -Echocardiogram-ejection fraction 55%  Acute on chronic congestive heart failure with preserved ejection fraction 55%, class II -Transition to p.o. Lasix.  Losartan stopped, Entresto added. -Monitor urine output. -Echocardiogram-ejection fraction 55% -Chest x-ray improved  Cough -Suspect from fluid overload versus heartburn with history of esophagitis.  Could also be ARB related? -Continue diuretic.  Will add PPI twice daily AC -Continue bronchodilators  Nonspecific lower abdominal pain, resolved -Improved.  CT abdomen pelvis negative for any acute pathology. -Renal ultrasound-negative  Essential hypertension -Continue Norvasc 5 mg daily, transition to home p.o. Lasix, isosorbide mononitrate,Metoprolol, Entresto  Hyperlipidemia -Statin  History of COPD -Continue bronchodilators.  History of gout -Colchicine as needed home allopurinol.  History of CVA -Plavix and Crestor  CKD stage IV -Creatinine baseline at 2.0.  Continue to monitor in setting of diuretics.  Diabetes mellitus type 2 -Hemoglobin A1c 7.2.  Continue insulin sliding scale and Accu-Chek -Detailed borderline low blood glucose, Lantus reduced to 45 units daily.  Follow-up outpatient PCP in 1 week.  Advised to keep a log of blood glucose  Obstructive sleep apnea -On CPAP  Consultations:  Cardiology  Subjective: No new complaints.  No exertional shortness of breath.  Discharge Exam: Vitals:   12/17/19 0813 12/17/19 1356  BP:    Pulse: 74   Resp: 18   Temp:    SpO2: 96% 94%   Vitals:   12/17/19 0354 12/17/19 0748 12/17/19 0813  12/17/19 1356  BP: (!) 119/59 (!)  136/56    Pulse: (!) 57 63 74   Resp: 16 19 18    Temp: 98.7 F (37.1 C) 98.1 F (36.7 C)    TempSrc: Oral     SpO2: 93% 96% 96% 94%  Weight:      Height:        General: Pt is alert, awake, not in acute distress Cardiovascular: RRR, S1/S2 +, no rubs, no gallops Respiratory: CTA bilaterally, no wheezing, no rhonchi Abdominal: Soft, NT, ND, bowel sounds + Extremities: no edema, no cyanosis  Discharge Instructions   Allergies as of 12/17/2019      Reactions   Gabapentin    Other reaction(s): Other (See Comments) Tremors   Peanut-containing Drug Products Anaphylaxis   Penicillins Hives, Rash   Has patient had a PCN reaction causing immediate rash, facial/tongue/throat swelling, SOB or lightheadedness with hypotension: Yes Has patient had a PCN reaction causing severe rash involving mucus membranes or skin necrosis: No Has patient had a PCN reaction that required hospitalization No Has patient had a PCN reaction occurring within the last 10 years: No If all of the above answers are "NO", then may proceed with Cephalosporin use.   Bee Venom Swelling   Influenza Vaccines Hives   Inh [isoniazid] Hives   Kenalog [triamcinolone Acetonide] Hives   Levaquin [levofloxacin] Other (See Comments)   Tendon, ligament pain.    Nalfon [fenoprofen Calcium] Hives   Naproxen    Peanut Oil    Nsaids Rash   Nalfon 600 Nalfon 600      Medication List    STOP taking these medications   losartan 25 MG tablet Commonly known as: COZAAR     TAKE these medications   acetaminophen 500 MG tablet Commonly known as: TYLENOL Take 1,000 mg by mouth every 6 (six) hours as needed for mild pain, moderate pain or headache.   allopurinol 100 MG tablet Commonly known as: ZYLOPRIM Take 100 mg by mouth daily.   amLODipine 5 MG tablet Commonly known as: NORVASC Take 5 mg by mouth daily.   aspirin 81 MG EC tablet Take 1 tablet (81 mg total) by mouth daily. Start taking on: December 18, 2019    budesonide-formoterol 160-4.5 MCG/ACT inhaler Commonly known as: SYMBICORT Inhale 2 puffs into the lungs 2 (two) times daily as needed.   cetirizine 5 MG tablet Commonly known as: ZYRTEC Take 5 mg by mouth daily as needed for allergies or rhinitis.   citalopram 10 MG tablet Commonly known as: CELEXA Take 10 mg by mouth daily.   clopidogrel 75 MG tablet Commonly known as: Plavix Take 1 tablet (75 mg total) by mouth daily.   colchicine 0.6 MG tablet Take 1 tablet (0.6 mg total) by mouth daily. What changed:   how much to take  when to take this  reasons to take this   dextromethorphan-guaiFENesin 30-600 MG 12hr tablet Commonly known as: MUCINEX DM Take 1 tablet by mouth 2 (two) times daily as needed for cough.   EPINEPHrine 0.3 mg/0.3 mL Soaj injection Commonly known as: EPI-PEN Inject into the muscle as needed (anaphylaxis).   ferrous sulfate 325 (65 FE) MG EC tablet Take 1 tablet (325 mg total) by mouth daily.   Fish Oil 1000 MG Caps Take 1 capsule by mouth daily.   fluticasone 50 MCG/ACT nasal spray Commonly known as: FLONASE Place 2 sprays into both nostrils daily.   folic acid 793 MCG tablet Commonly known as: V-R FOLIC  ACID Take 1 tablet (400 mcg total) by mouth daily.   furosemide 20 MG tablet Commonly known as: LASIX Take 2 tablets (40 mg total) by mouth daily.   hydrALAZINE 25 MG tablet Commonly known as: APRESOLINE Take 1 tablet (25 mg total) by mouth every 8 (eight) hours.   insulin glargine 100 UNIT/ML injection Commonly known as: LANTUS Inject 0.45 mLs (45 Units total) into the skin at bedtime. What changed: how much to take   ipratropium-albuterol 0.5-2.5 (3) MG/3ML Soln Commonly known as: DUONEB Take 3 mLs by nebulization every 4 (four) hours as needed.   isosorbide mononitrate 60 MG 24 hr tablet Commonly known as: IMDUR Take 60 mg by mouth daily.   Magnesium 250 MG Tabs Take 250 mg by mouth daily.   methocarbamol 500 MG  tablet Commonly known as: ROBAXIN Take 500 mg by mouth 2 (two) times daily.   metoprolol tartrate 50 MG tablet Commonly known as: LOPRESSOR Take 75 mg by mouth 2 (two) times daily.   montelukast 10 MG tablet Commonly known as: SINGULAIR TAKE 1 TABLET BY MOUTH EVERYDAY AT BEDTIME   nitroGLYCERIN 0.4 MG SL tablet Commonly known as: NITROSTAT Place 0.4 mg under the tongue every 5 (five) minutes x 3 doses as needed for chest pain.   NovoLOG FlexPen 100 UNIT/ML FlexPen Generic drug: insulin aspart Inject 20-32 Units into the skin See admin instructions. 20-32 units THREE daily as directed   pantoprazole 40 MG tablet Commonly known as: PROTONIX Take 1 tablet (40 mg total) by mouth 2 (two) times daily before a meal. What changed: when to take this   rosuvastatin 40 MG tablet Commonly known as: CRESTOR Take 40 mg by mouth every evening.   sacubitril-valsartan 24-26 MG Commonly known as: ENTRESTO Take 1 tablet by mouth 2 (two) times daily.   vitamin C 250 MG tablet Commonly known as: ASCORBIC ACID Take 250 mg by mouth daily.   Vitamin D (Ergocalciferol) 1.25 MG (50000 UT) Caps capsule Commonly known as: DRISDOL Take 50,000 Units by mouth every 30 (thirty) days.   zinc gluconate 50 MG tablet Take 50 mg by mouth daily.      Follow-up Information    Cletis Athens, MD. Schedule an appointment as soon as possible for a visit in 1 week(s).   Specialty: Cardiology Contact information: [address] [place] [address] [phone]          Allergies  Allergen Reactions  . Gabapentin     Other reaction(s): Other (See Comments) Tremors  . Peanut-Containing Drug Products Anaphylaxis  . Penicillins Hives and Rash    Has patient had a PCN reaction causing immediate rash, facial/tongue/throat swelling, SOB or lightheadedness with hypotension: Yes Has patient had a PCN reaction causing severe rash involving mucus membranes or skin necrosis: No Has patient had a PCN  reaction that required hospitalization No Has patient had a PCN reaction occurring within the last 10 years: No If all of the above answers are "NO", then may proceed with Cephalosporin use.  . Bee Venom Swelling  . Influenza Vaccines Hives  . Inh [Isoniazid] Hives  . Kenalog [Triamcinolone Acetonide] Hives  . Levaquin [Levofloxacin] Other (See Comments)    Tendon, ligament pain.   . Nalfon [Fenoprofen Calcium] Hives  . Naproxen   . Peanut Oil   . Nsaids Rash    Nalfon 600 Nalfon 600    You were cared for by a hospitalist during your hospital stay. If you have any questions about your  discharge medications or the care you received while you were in the hospital after you are discharged, you can call the unit and asked to speak with the hospitalist on call if the hospitalist that took care of you is not available. Once you are discharged, your primary care physician will handle any further medical issues. Please note that no refills for any discharge medications will be authorized once you are discharged, as it is imperative that you return to your primary care physician (or establish a relationship with a primary care physician if you do not have one) for your aftercare needs so that they can reassess your need for medications and monitor your lab values.   Procedures/Studies: CT ABDOMEN PELVIS WO CONTRAST  Result Date: 12/16/2019 CLINICAL DATA:  Diverticulitis suspected. Left upper quadrant and around movement EXAM: CT ABDOMEN AND PELVIS WITHOUT CONTRAST TECHNIQUE: Multidetector CT imaging of the abdomen and pelvis was performed following the standard protocol without IV contrast. COMPARISON:  05/07/2017 FINDINGS: Lower chest:  No contributory findings. Hepatobiliary: Subtle low-density lesion on the upper right liver, hemangioma based on previous imaging/reporting.No evidence of biliary obstruction or stone. Pancreas: Unremarkable. Spleen: Unremarkable. Adrenals/Urinary Tract: Negative  adrenals. No hydronephrosis or stone. Unremarkable bladder. Stomach/Bowel: No obstruction. No appendicitis. Sigmoid diverticulosis. No diverticulitis noted. Vascular/Lymphatic: No acute vascular abnormality. 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 No mass or adenopathy. Reproductive:Prostatectomy without worrisome nodularity. Other: No ascites or pneumoperitoneum. Fatty right inguinal hernia. Left inguinal hernia repair. Musculoskeletal: No acute abnormalities. IMPRESSION: 1. No acute finding including evidence of diverticulitis 2. Fatty right inguinal hernia. 3. Left colonic diverticulosis. Electronically Signed   By: Monte Fantasia M.D.   On: 12/16/2019 08:00   DG Chest 2 View  Result Date: 12/14/2019 CLINICAL DATA:  Chest pain EXAM: CHEST - 2 VIEW COMPARISON:  11/24/2019, 06/10/2019 FINDINGS: No consolidation or pleural effusion. Mild coarse bronchitic changes. Stable cardiomediastinal silhouette with borderline cardiomegaly. No pneumothorax. IMPRESSION: Low lung volumes with coarse bronchitic changes. No consolidative airspace disease. Borderline to mild cardiomegaly. Electronically Signed   By: Donavan Foil M.D.   On: 12/14/2019 03:18   DG Chest 2 View  Result Date: 11/25/2019 CLINICAL DATA:  Cough, wheezing EXAM: CHEST - 2 VIEW COMPARISON:  06/10/2019 FINDINGS: The heart size and mediastinal contours are within normal limits. Both lungs are clear. No pleural effusion. The visualized skeletal structures are unremarkable. IMPRESSION: No acute process in the chest. Electronically Signed   By: Macy Mis M.D.   On: 11/25/2019 08:46   US RENAL  Result Date: 12/16/2019 CLINICAL DATA:  Left upper quadrant pain.  History of renal stone. EXAM: RENAL / URINARY TRACT ULTRASOUND COMPLETE COMPARISON:  Abdominal CT from earlier the same day FINDINGS: Right Kidney: Renal measurements: 10.7 x 4.8 x 4.8 cm = volume: 130 mL. No hydronephrosis or mass. Two  small cystic structures measuring 12 and 6 mm. Left Kidney: Renal measurements: 11.5 x 5 x 4.7 cm = volume: 141 mL. Echogenicity within normal limits. No mass or hydronephrosis visualized. Bladder: Appears normal for degree of bladder distention. IMPRESSION: Essentially negative renal ultrasound. No renal calculi were seen on preceding abdominal CT. Electronically Signed   By: Monte Fantasia M.D.   On: 12/16/2019 09:02   DG Chest Port 1 View  Result Date: 12/16/2019 CLINICAL DATA:  Dyspnea, short of breath with exertion, history of prostate esophageal cancer, CHF, diabetes mellitus, hypertension, MI EXAM: PORTABLE CHEST 1 VIEW COMPARISON:  Portable exam 1009 hours compared  to 12/14/2019 FINDINGS: Borderline enlargement of cardiac silhouette. Mediastinal contours and pulmonary vascularity normal. Lungs clear. No pulmonary infiltrate, pleural effusion or pneumothorax. Osseous structures unremarkable. IMPRESSION: No acute abnormalities. Electronically Signed   By: Lavonia Dana M.D.   On: 12/16/2019 10:28   ECHOCARDIOGRAM COMPLETE  Result Date: 12/15/2019   ECHOCARDIOGRAM REPORT   Patient Name:   Mike Decker Date of Exam: 12/14/2019 Medical Rec #:  [id number]        Height:       66.0 in Accession #:    [id number]       Weight:       206.0 lb Date of Birth:  11-Feb-1941        BSA:          2.03 m Patient Age:    34 years         BP:           130/57 mmHg Patient Gender: M                HR:           68 bpm. Exam Location:  [facility] Procedure: 2D Echo, Color Doppler and Cardiac Doppler Indications:     Chest pain 786.50  History:         Patient has prior history of Echocardiogram examinations, most                  recent 11/15/2018. Risk Factors:Sleep Apnea, Hypertension and                  Diabetes. Mi, PVD.  Sonographer:     Sherrie Sport RDCS (AE) Referring Phys:  [id number] Soledad Gerlach NIU Diagnosing Phys: Yolonda Kida MD  Sonographer Comments: Suboptimal parasternal window. IMPRESSIONS  1. Left ventricular ejection  fraction, by visual estimation, is 50 to 55%. The left ventricle has normal function. Normal left ventricular posterior wall thickness. There is no left ventricular hypertrophy.  2. The left ventricle has no regional wall motion abnormalities.  3. Global right ventricle has normal systolic function.The right ventricular size is normal. No increase in right ventricular wall thickness.  4. Left atrial size was normal.  5. Right atrial size was normal.  6. The mitral valve is normal in structure. Mild mitral valve regurgitation.  7. The tricuspid valve is not well visualized.  8. The aortic valve is grossly normal. Aortic valve regurgitation is not visualized. Mild aortic valve sclerosis without stenosis.  9. The pulmonic valve was normal in structure. Pulmonic valve regurgitation is not visualized. 10. Mildly elevated pulmonary artery systolic pressure. FINDINGS  Left Ventricle: Left ventricular ejection fraction, by visual estimation, is 50 to 55%. The left ventricle has normal function. The left ventricle has no regional wall motion abnormalities. Normal left ventricular posterior wall thickness. There is no left ventricular hypertrophy. Right Ventricle: The right ventricular size is normal. No increase in right ventricular wall thickness. Global RV systolic function is has normal systolic function. The tricuspid regurgitant velocity is 2.36 m/s, and with an assumed right atrial pressure  of 10 mmHg, the estimated right ventricular systolic pressure is mildly elevated at 32.3 mmHg. Left Atrium: Left atrial size was normal in size. Right Atrium: Right atrial size was normal in size Pericardium: There is no evidence of pericardial effusion. Mitral Valve: The mitral valve is normal in structure. Mild mitral valve regurgitation. Tricuspid Valve: The tricuspid valve is not well visualized. Tricuspid valve regurgitation is trivial. Aortic Valve: The aortic  valve is grossly normal. Aortic valve regurgitation is not  visualized. Mild aortic valve sclerosis is present, with no evidence of aortic valve stenosis. Aortic valve mean gradient measures 11.3 mmHg. Aortic valve peak gradient measures 20.8 mmHg. Aortic valve area, by VTI measures 1.19 cm. Pulmonic Valve: The pulmonic valve was normal in structure. Pulmonic valve regurgitation is not visualized. Pulmonic regurgitation is not visualized. Aorta: The aortic root is normal in size and structure. IAS/Shunts: No atrial level shunt detected by color flow Doppler.  LEFT VENTRICLE PLAX 2D LVIDd:         4.05 cm LVIDs:         2.98 cm LV PW:         1.25 cm LV IVS:        1.80 cm LVOT diam:     2.00 cm LV SV:         38 ml LV SV Index:   17.76 LVOT Area:     3.14 cm  RIGHT VENTRICLE RV Basal diam:  3.73 cm RV S prime:     12.40 cm/s TAPSE (M-mode): 3.4 cm LEFT ATRIUM             Index       RIGHT ATRIUM           Index LA diam:        4.00 cm 1.98 cm/m  RA Area:     18.30 cm LA Vol (A2C):   56.6 ml 27.95 ml/m RA Volume:   52.10 ml  25.73 ml/m LA Vol (A4C):   68.0 ml 33.58 ml/m LA Biplane Vol: 65.6 ml 32.39 ml/m  AORTIC VALVE                    PULMONIC VALVE AV Area (Vmax):    1.12 cm     PV Vmax:        0.98 m/s AV Area (Vmean):   1.18 cm     PV Peak grad:   3.8 mmHg AV Area (VTI):     1.19 cm     RVOT Peak grad: 6 mmHg AV Vmax:           228.00 cm/s AV Vmean:          152.000 cm/s AV VTI:            0.448 m AV Peak Grad:      20.8 mmHg AV Mean Grad:      11.3 mmHg LVOT Vmax:         81.30 cm/s LVOT Vmean:        57.000 cm/s LVOT VTI:          0.170 m LVOT/AV VTI ratio: 0.38  AORTA Ao Root diam: 2.90 cm MITRAL VALVE                       TRICUSPID VALVE MV Area (PHT): 1.12 cm            TR Peak grad:   22.3 mmHg MV PHT:        196.62 msec         TR Vmax:        236.00 cm/s MV Decel Time: 678 msec MV E velocity: 96.25 cm/s 103 cm/s SHUNTS  Systemic VTI:  0.17 m                                    Systemic Diam: 2.00 cm  Yolonda Kida  MD Electronically signed by Yolonda Kida MD Signature Date/Time: 12/15/2019/11:31:19 AM    Final       The results of significant diagnostics from this hospitalization (including imaging, microbiology, ancillary and laboratory) are listed below for reference.     Microbiology: Recent Results (from the past 240 hour(s))  Respiratory Panel by RT PCR (Flu A&B, Covid) - Nasopharyngeal Swab     Status: None   Collection Time: 12/14/19  7:52 AM   Specimen: Nasopharyngeal Swab  Result Value Ref Range Status   SARS Coronavirus 2 by RT PCR NEGATIVE NEGATIVE Final    Comment: (NOTE) SARS-CoV-2 target nucleic acids are NOT DETECTED. The SARS-CoV-2 RNA is generally detectable in upper respiratoy specimens during the acute phase of infection. The lowest concentration of SARS-CoV-2 viral copies this assay can detect is 131 copies/mL. A negative result does not preclude SARS-Cov-2 infection and should not be used as the sole basis for treatment or other patient management decisions. A negative result may occur with  improper specimen collection/handling, submission of specimen other than nasopharyngeal swab, presence of viral mutation(s) within the areas targeted by this assay, and inadequate number of viral copies (<131 copies/mL). A negative result must be combined with clinical observations, patient history, and epidemiological information. The expected result is Negative. Fact Sheet for Patients:  PinkCheek.be Fact Sheet for Healthcare Providers:  GravelBags.it This test is not yet ap proved or cleared by the [place] FDA and  has been authorized for detection and/or diagnosis of SARS-CoV-2 by FDA under an Emergency Use Authorization (EUA). This EUA will remain  in effect (meaning this test can be used) for the duration of the COVID-19 declaration under Section 564(b)(1) of the Act, 21 U.S.C. section 360bbb-3(b)(1), unless  the authorization is terminated or revoked sooner.    Influenza A by PCR NEGATIVE NEGATIVE Final   Influenza B by PCR NEGATIVE NEGATIVE Final    Comment: (NOTE) The Xpert Xpress SARS-CoV-2/FLU/RSV assay is intended as an aid in  the diagnosis of influenza from Nasopharyngeal swab specimens and  should not be used as a sole basis for treatment. Nasal washings and  aspirates are unacceptable for Xpert Xpress SARS-CoV-2/FLU/RSV  testing. Fact Sheet for Patients: PinkCheek.be Fact Sheet for Healthcare Providers: GravelBags.it This test is not yet approved or cleared by the [place] FDA and  has been authorized for detection and/or diagnosis of SARS-CoV-2 by  FDA under an Emergency Use Authorization (EUA). This EUA will remain  in effect (meaning this test can be used) for the duration of the  Covid-19 declaration under Section 564(b)(1) of the Act, 21  U.S.C. section 360bbb-3(b)(1), unless the authorization is  terminated or revoked. Performed at [facility], [address] [place], [place] [address]      Labs: BNP (last 3 results) Recent Labs    12/01/19 1151 12/14/19 0752 12/16/19 0633  BNP 197.0* 364.0* 174.9*   Basic Metabolic Panel: Recent Labs  Lab 12/14/19 0233 12/16/19 0633  NA 137 135  K 4.0 3.9  CL 105 102  CO2 21* 24  GLUCOSE 290* 161*  BUN 42* 41*  CREATININE 1.60* 1.82*  CALCIUM 8.9 8.7*   Liver Function Tests: No results for  input(s): AST, ALT, ALKPHOS, BILITOT, PROT, ALBUMIN in the last 168 hours. No results for input(s): LIPASE, AMYLASE in the last 168 hours. No results for input(s): AMMONIA in the last 168 hours. CBC: Recent Labs  Lab 12/14/19 0233 12/15/19 0209 12/16/19 0633  WBC 10.7* 10.8* 12.3*  HGB 12.2* 10.8* 11.4*  HCT 35.2* 32.8* 35.7*  MCV 87.3 92.7 93.2  PLT 227 225 235   Cardiac Enzymes: No results for input(s): CKTOTAL, CKMB, CKMBINDEX, TROPONINI in the  last 168 hours. BNP: Invalid input(s): POCBNP CBG: Recent Labs  Lab 12/16/19 1959 12/16/19 2351 12/17/19 0356 12/17/19 0726 12/17/19 1148  GLUCAP 209* 134* 66* 75 182*   D-Dimer No results for input(s): DDIMER in the last 72 hours. Hgb A1c Recent Labs    12/15/19 0209  HGBA1C 8.7*   Lipid Profile Recent Labs    12/15/19 0209  CHOL 107  HDL 22*  LDLCALC 33  TRIG 260*  CHOLHDL 4.9   Thyroid function studies No results for input(s): TSH, T4TOTAL, T3FREE, THYROIDAB in the last 72 hours.  Invalid input(s): FREET3 Anemia work up No results for input(s): VITAMINB12, FOLATE, FERRITIN, TIBC, IRON, RETICCTPCT in the last 72 hours. Urinalysis    Component Value Date/Time   COLORURINE YELLOW (A) 12/11/2018 1512   APPEARANCEUR CLEAR (A) 12/11/2018 1512   LABSPEC 1.013 12/11/2018 1512   PHURINE 5.0 12/11/2018 1512   GLUCOSEU NEGATIVE 12/11/2018 1512   HGBUR NEGATIVE 12/11/2018 1512   BILIRUBINUR NEGATIVE 12/11/2018 1512   KETONESUR NEGATIVE 12/11/2018 1512   PROTEINUR NEGATIVE 12/11/2018 1512   NITRITE NEGATIVE 12/11/2018 1512   LEUKOCYTESUR NEGATIVE 12/11/2018 1512   Sepsis Labs Invalid input(s): PROCALCITONIN,  WBC,  LACTICIDVEN Microbiology Recent Results (from the past 240 hour(s))  Respiratory Panel by RT PCR (Flu A&B, Covid) - Nasopharyngeal Swab     Status: None   Collection Time: 12/14/19  7:52 AM   Specimen: Nasopharyngeal Swab  Result Value Ref Range Status   SARS Coronavirus 2 by RT PCR NEGATIVE NEGATIVE Final    Comment: (NOTE) SARS-CoV-2 target nucleic acids are NOT DETECTED. The SARS-CoV-2 RNA is generally detectable in upper respiratoy specimens during the acute phase of infection. The lowest concentration of SARS-CoV-2 viral copies this assay can detect is 131 copies/mL. A negative result does not preclude SARS-Cov-2 infection and should not be used as the sole basis for treatment or other patient management decisions. A negative result may occur  with  improper specimen collection/handling, submission of specimen other than nasopharyngeal swab, presence of viral mutation(s) within the areas targeted by this assay, and inadequate number of viral copies (<131 copies/mL). A negative result must be combined with clinical observations, patient history, and epidemiological information. The expected result is Negative. Fact Sheet for Patients:  PinkCheek.be Fact Sheet for Healthcare Providers:  GravelBags.it This test is not yet ap proved or cleared by the [place] FDA and  has been authorized for detection and/or diagnosis of SARS-CoV-2 by FDA under an Emergency Use Authorization (EUA). This EUA will remain  in effect (meaning this test can be used) for the duration of the COVID-19 declaration under Section 564(b)(1) of the Act, 21 U.S.C. section 360bbb-3(b)(1), unless the authorization is terminated or revoked sooner.    Influenza A by PCR NEGATIVE NEGATIVE Final   Influenza B by PCR NEGATIVE NEGATIVE Final    Comment: (NOTE) The Xpert Xpress SARS-CoV-2/FLU/RSV assay is intended as an aid in  the diagnosis of influenza from Nasopharyngeal swab specimens and  should not  be used as a sole basis for treatment. Nasal washings and  aspirates are unacceptable for Xpert Xpress SARS-CoV-2/FLU/RSV  testing. Fact Sheet for Patients: PinkCheek.be Fact Sheet for Healthcare Providers: GravelBags.it This test is not yet approved or cleared by the [place] FDA and  has been authorized for detection and/or diagnosis of SARS-CoV-2 by  FDA under an Emergency Use Authorization (EUA). This EUA will remain  in effect (meaning this test can be used) for the duration of the  Covid-19 declaration under Section 564(b)(1) of the Act, 21  U.S.C. section 360bbb-3(b)(1), unless the authorization is  terminated or revoked. Performed  at [facility], [address] [place], [place] [address]      Time coordinating discharge:  I have spent 35 minutes face to face with the patient and on the ward discussing the patients care, assessment, plan and disposition with other care givers. >50% of the time was devoted counseling the patient about the risks and benefits of treatment/Discharge disposition and coordinating care.   SIGNED:   Damita Lack, MD  [facility] 12/17/2019, 2:19 PM   If 7PM-7AM, please contact night-coverage

## 2019-12-17 NOTE — Discharge Instructions (Signed)
Angina  Angina is extreme discomfort in the chest, neck, arm, jaw, or back. The discomfort is caused by a lack of blood in the middle layer of the heart wall (myocardium). There are four types of angina:  Stable angina. This is triggered by vigorous activity or exercise. It goes away when you rest or take angina medicine.  Unstable angina. This is a warning sign and can lead to a heart attack (acute coronary syndrome). This is a medical emergency. Symptoms come at rest and last a long time.  Microvascular angina. This affects the small coronary arteries. Symptoms include feeling tired and being short of breath.  Prinzmetal or variant angina. This is caused by a tightening (spasm) of the arteries that go to your heart. What are the causes? This condition is caused by atherosclerosis. This is the buildup of fat and cholesterol (plaque) in your arteries. The plaque may narrow or block the artery. Other causes of angina include:  Sudden tightening of the muscles of the arteries in the heart (coronary spasm).  Small artery disease (microvascular dysfunction).  Problems with any of your heart valves (heart valve disease).  A tear in an artery in your heart (coronary artery dissection).  Diseases of the heart muscle (cardiomyopathy), or other heart diseases. What increases the risk? You are more likely to develop this condition if you have:  High cholesterol.  High blood pressure (hypertension).  Diabetes.  A family history of heart disease.  An inactive (sedentary) lifestyle, or you do not exercise enough.  Depression.  Had radiation treatment to the left side of your chest. Other risk factors include:  Using tobacco.  Being obese.  Eating a diet high in saturated fats.  Being exposed to high stress or triggers of stress.  Using drugs, such as cocaine. Women have a greater risk for angina if:  They are older than 55.  They have gone through menopause (are  postmenopausal). What are the signs or symptoms? Common symptoms of this condition in both men and women may include:  Chest pain, which may: ? Feel like a crushing or squeezing in the chest, or like a tightness, pressure, fullness, or heaviness in the chest. ? Last for more than a few minutes at a time, or it may stop and come back (recur) over the course of a few minutes.  Pain in the neck, arm, jaw, or back.  Unexplained heartburn or indigestion.  Shortness of breath.  Nausea.  Sudden cold sweats. Women and people with diabetes may have unusual (atypical) symptoms, such as:  Fatigue.  Unexplained feelings of nervousness or anxiety.  Unexplained weakness.  Dizziness or fainting. How is this diagnosed? This condition may be diagnosed based on:  Your symptoms and medical history.  Electrocardiogram (ECG) to measure the electrical activity in your heart.  Blood tests.  Stress test to look for signs of blockage when your heart is stressed.  CT angiogram to examine your heart and the blood flow to it.  Coronary angiogram to check your coronary arteries for blockage. How is this treated? Angina may be treated with:  Medicines to: ? Prevent blood clots and heart attack. ? Relax blood vessels and improve blood flow to the heart (nitrates). ? Reduce blood pressure, improve the pumping action of the heart, and relax blood vessels that are spasming. ? Reduce cholesterol and help treat atherosclerosis.  A procedure to widen a narrowed or blocked coronary artery (angioplasty). A mesh tube may be placed in a coronary artery to   keep it open (coronary stenting).  Surgery to allow blood to go around a blocked artery (coronary artery bypass surgery). Follow these instructions at home: Medicines  Take over-the-counter and prescription medicines only as told by your health care provider.  Do not take the following medicines unless your health care provider approves: ? NSAIDs,  such as ibuprofen or naproxen. ? Vitamin supplements that contain vitamin A, vitamin E, or both. ? Hormone replacement therapy that contains estrogen with or without progestin. Eating and drinking   Eat a heart-healthy diet. This includes plenty of fresh fruits and vegetables, whole grains, low-fat (lean) protein, and low-fat dairy products.  Follow instructions from your health care provider about eating or drinking restrictions. Activity  Follow an exercise program approved by your health care provider.  Consider joining a cardiac rehabilitation program.  Take a break when you feel fatigued. Plan rest periods in your daily activities. Lifestyle   Do not use any products that contain nicotine or tobacco, such as cigarettes, e-cigarettes, and chewing tobacco. If you need help quitting, ask your health care provider.  If your health care provider says you can drink alcohol: ? Limit how much you use to:  0-1 drink a day for nonpregnant women.  0-2 drinks a day for men. ? Be aware of how much alcohol is in your drink. In the [place] one drink equals one 12 oz bottle of beer (355 mL), one 5 oz glass of wine (148 mL), or one 1 oz glass of hard liquor (44 mL). General instructions  Maintain a healthy weight.  Learn to manage stress.  Keep your vaccinations up to date. Get the flu (influenza) vaccine every year.  Talk to your health care provider if you feel depressed. Take a depression screening test to see if you are at risk for depression.  Work with your health care provider to manage other health conditions, such as hypertension or diabetes.  Keep all follow-up visits as told by your health care provider. This is important. Get help right away if:  You have pain in your chest, neck, arm, jaw, or back, and the pain: ? Lasts more than a few minutes. ? Is recurring. ? Is not relieved by taking medicines under the tongue (sublingual nitroglycerin). ? Increases in intensity or  frequency.  You have a lot of sweating without cause.  You have unexplained: ? Heartburn or indigestion. ? Shortness of breath or difficulty breathing. ? Nausea or vomiting. ? Fatigue. ? Feelings of nervousness or anxiety. ? Weakness.  You have sudden light-headedness or dizziness.  You faint. These symptoms may represent a serious problem that is an emergency. Do not wait to see if the symptoms will go away. Get medical help right away. Call your local emergency services (911 in the [place] Do not drive yourself to the hospital. Summary  Angina is extreme discomfort in the chest, neck, arm, jaw, or back that is caused by a lack of blood in the heart wall.  There are many symptoms of angina. They include chest pain, unexplained heartburn or indigestion, sudden cold sweats, and fatigue.  Angina may be treated with behavioral changes, medicine, or surgery.  Symptoms of angina may represent an emergency. Get medical help right away. Call your local emergency services (911 in the [place] Do not drive yourself to the hospital. This information is not intended to replace advice given to you by your health care provider. Make sure you discuss any questions you have with your health care provider.   Document Revised: 07/14/2018 Document Reviewed: 07/14/2018 [employer]vier Patient Education  2020 [employer]  

## 2019-12-21 ENCOUNTER — Other Ambulatory Visit: Payer: Self-pay | Admitting: Internal Medicine

## 2019-12-21 ENCOUNTER — Other Ambulatory Visit
Admission: RE | Admit: 2019-12-21 | Discharge: 2019-12-21 | Disposition: A | Discharge status: Home / Self Care | Payer: Medicare Other | Source: Ambulatory Visit | Attending: Internal Medicine | Admitting: Internal Medicine

## 2019-12-21 ENCOUNTER — Ambulatory Visit
Admission: RE | Admit: 2019-12-21 | Discharge: 2019-12-21 | Disposition: A | Discharge status: Home / Self Care | Payer: Medicare Other | Source: Ambulatory Visit | Attending: Internal Medicine | Admitting: Internal Medicine

## 2019-12-21 DIAGNOSIS — R079 Chest pain, unspecified: Secondary | ICD-10-CM

## 2019-12-21 DIAGNOSIS — I509 Heart failure, unspecified: Secondary | ICD-10-CM | POA: Diagnosis not present

## 2019-12-21 DIAGNOSIS — E119 Type 2 diabetes mellitus without complications: Secondary | ICD-10-CM | POA: Diagnosis not present

## 2019-12-21 DIAGNOSIS — R0602 Shortness of breath: Secondary | ICD-10-CM | POA: Diagnosis not present

## 2019-12-21 DIAGNOSIS — J45909 Unspecified asthma, uncomplicated: Secondary | ICD-10-CM | POA: Diagnosis not present

## 2019-12-21 DIAGNOSIS — Z87891 Personal history of nicotine dependence: Secondary | ICD-10-CM | POA: Diagnosis not present

## 2019-12-21 DIAGNOSIS — J9811 Atelectasis: Secondary | ICD-10-CM | POA: Diagnosis not present

## 2019-12-21 LAB — CREATININE, SERUM
Creatinine, Ser: 1.8 mg/dL — ABNORMAL HIGH (ref 0.61–1.24)
GFR calc Af Amer: 41 mL/min — ABNORMAL LOW (ref >60)
GFR calc non Af Amer: 35 mL/min — ABNORMAL LOW (ref >60)

## 2019-12-21 LAB — ELECTROLYTE PANEL
Anion gap: 11 (ref 5–15)
CO2: 21 mmol/L — ABNORMAL LOW (ref 22–32)
Chloride: 104 mmol/L (ref 98–111)
Potassium: 5.1 mmol/L (ref 3.5–5.1)
Sodium: 136 mmol/L (ref 135–145)

## 2019-12-21 LAB — CBC
HCT: 36.7 % — ABNORMAL LOW (ref 39.0–52.0)
Hemoglobin: 11.6 g/dL — ABNORMAL LOW (ref 13.0–17.0)
MCH: 29.9 pg (ref 26.0–34.0)
MCHC: 31.6 g/dL (ref 30.0–36.0)
MCV: 94.6 fL (ref 80.0–100.0)
Platelets: 288 10*3/uL (ref 150–400)
RBC: 3.88 MIL/uL — ABNORMAL LOW (ref 4.22–5.81)
RDW: 14.6 % (ref 11.5–15.5)
WBC: 8.7 10*3/uL (ref 4.0–10.5)
nRBC: 0 % (ref 0.0–0.2)

## 2019-12-21 LAB — BUN: BUN: 52 mg/dL — ABNORMAL HIGH (ref 8–23)

## 2019-12-21 LAB — BRAIN NATRIURETIC PEPTIDE: B Natriuretic Peptide: 328 pg/mL — ABNORMAL HIGH (ref 0.0–100.0)

## 2019-12-22 ENCOUNTER — Ambulatory Visit: Payer: Medicare Other | Admitting: Internal Medicine

## 2019-12-28 ENCOUNTER — Other Ambulatory Visit: Payer: Self-pay

## 2019-12-28 ENCOUNTER — Other Ambulatory Visit
Admission: RE | Admit: 2019-12-28 | Discharge: 2019-12-28 | Disposition: A | Discharge status: Home / Self Care | Payer: Medicare Other | Source: Ambulatory Visit | Attending: Internal Medicine | Admitting: Internal Medicine

## 2019-12-28 ENCOUNTER — Inpatient Hospital Stay
Admission: EM | Admit: 2019-12-28 | Discharge: 2019-12-30 | DRG: 246 | Disposition: A | Discharge status: Home / Self Care | Payer: Medicare Other | Attending: Hospitalist | Admitting: Hospitalist

## 2019-12-28 ENCOUNTER — Other Ambulatory Visit: Payer: Self-pay | Admitting: Internal Medicine

## 2019-12-28 ENCOUNTER — Emergency Department: Payer: Medicare Other

## 2019-12-28 ENCOUNTER — Other Ambulatory Visit: Payer: Self-pay | Admitting: *Deleted

## 2019-12-28 DIAGNOSIS — F419 Anxiety disorder, unspecified: Secondary | ICD-10-CM | POA: Diagnosis present

## 2019-12-28 DIAGNOSIS — Z823 Family history of stroke: Secondary | ICD-10-CM

## 2019-12-28 DIAGNOSIS — Z20822 Contact with and (suspected) exposure to covid-19: Secondary | ICD-10-CM | POA: Diagnosis not present

## 2019-12-28 DIAGNOSIS — N189 Chronic kidney disease, unspecified: Secondary | ICD-10-CM

## 2019-12-28 DIAGNOSIS — F329 Major depressive disorder, single episode, unspecified: Secondary | ICD-10-CM | POA: Diagnosis present

## 2019-12-28 DIAGNOSIS — R079 Chest pain, unspecified: Secondary | ICD-10-CM | POA: Diagnosis not present

## 2019-12-28 DIAGNOSIS — E785 Hyperlipidemia, unspecified: Secondary | ICD-10-CM | POA: Diagnosis present

## 2019-12-28 DIAGNOSIS — I259 Chronic ischemic heart disease, unspecified: Secondary | ICD-10-CM | POA: Diagnosis not present

## 2019-12-28 DIAGNOSIS — Z887 Allergy status to serum and vaccine status: Secondary | ICD-10-CM

## 2019-12-28 DIAGNOSIS — Z87891 Personal history of nicotine dependence: Secondary | ICD-10-CM

## 2019-12-28 DIAGNOSIS — E1151 Type 2 diabetes mellitus with diabetic peripheral angiopathy without gangrene: Secondary | ICD-10-CM | POA: Diagnosis present

## 2019-12-28 DIAGNOSIS — Z794 Long term (current) use of insulin: Secondary | ICD-10-CM

## 2019-12-28 DIAGNOSIS — I5032 Chronic diastolic (congestive) heart failure: Secondary | ICD-10-CM | POA: Diagnosis present

## 2019-12-28 DIAGNOSIS — R0602 Shortness of breath: Secondary | ICD-10-CM | POA: Diagnosis not present

## 2019-12-28 DIAGNOSIS — Z886 Allergy status to analgesic agent status: Secondary | ICD-10-CM

## 2019-12-28 DIAGNOSIS — J45909 Unspecified asthma, uncomplicated: Secondary | ICD-10-CM | POA: Diagnosis not present

## 2019-12-28 DIAGNOSIS — E538 Deficiency of other specified B group vitamins: Secondary | ICD-10-CM

## 2019-12-28 DIAGNOSIS — E11649 Type 2 diabetes mellitus with hypoglycemia without coma: Secondary | ICD-10-CM | POA: Diagnosis not present

## 2019-12-28 DIAGNOSIS — Z79899 Other long term (current) drug therapy: Secondary | ICD-10-CM

## 2019-12-28 DIAGNOSIS — I4891 Unspecified atrial fibrillation: Secondary | ICD-10-CM | POA: Diagnosis present

## 2019-12-28 DIAGNOSIS — J440 Chronic obstructive pulmonary disease with acute lower respiratory infection: Secondary | ICD-10-CM | POA: Diagnosis not present

## 2019-12-28 DIAGNOSIS — R0789 Other chest pain: Secondary | ICD-10-CM | POA: Diagnosis not present

## 2019-12-28 DIAGNOSIS — E1122 Type 2 diabetes mellitus with diabetic chronic kidney disease: Secondary | ICD-10-CM | POA: Diagnosis present

## 2019-12-28 DIAGNOSIS — M109 Gout, unspecified: Secondary | ICD-10-CM | POA: Diagnosis present

## 2019-12-28 DIAGNOSIS — N1832 Chronic kidney disease, stage 3b: Secondary | ICD-10-CM | POA: Diagnosis present

## 2019-12-28 DIAGNOSIS — Z7982 Long term (current) use of aspirin: Secondary | ICD-10-CM

## 2019-12-28 DIAGNOSIS — Z888 Allergy status to other drugs, medicaments and biological substances status: Secondary | ICD-10-CM

## 2019-12-28 DIAGNOSIS — I129 Hypertensive chronic kidney disease with stage 1 through stage 4 chronic kidney disease, or unspecified chronic kidney disease: Secondary | ICD-10-CM | POA: Diagnosis not present

## 2019-12-28 DIAGNOSIS — Z8673 Personal history of transient ischemic attack (TIA), and cerebral infarction without residual deficits: Secondary | ICD-10-CM

## 2019-12-28 DIAGNOSIS — Z955 Presence of coronary angioplasty implant and graft: Secondary | ICD-10-CM

## 2019-12-28 DIAGNOSIS — Z7902 Long term (current) use of antithrombotics/antiplatelets: Secondary | ICD-10-CM

## 2019-12-28 DIAGNOSIS — I2510 Atherosclerotic heart disease of native coronary artery without angina pectoris: Secondary | ICD-10-CM | POA: Diagnosis present

## 2019-12-28 DIAGNOSIS — I130 Hypertensive heart and chronic kidney disease with heart failure and stage 1 through stage 4 chronic kidney disease, or unspecified chronic kidney disease: Secondary | ICD-10-CM | POA: Diagnosis not present

## 2019-12-28 DIAGNOSIS — I252 Old myocardial infarction: Secondary | ICD-10-CM

## 2019-12-28 DIAGNOSIS — J181 Lobar pneumonia, unspecified organism: Secondary | ICD-10-CM

## 2019-12-28 DIAGNOSIS — I213 ST elevation (STEMI) myocardial infarction of unspecified site: Secondary | ICD-10-CM | POA: Diagnosis not present

## 2019-12-28 DIAGNOSIS — G4733 Obstructive sleep apnea (adult) (pediatric): Secondary | ICD-10-CM | POA: Diagnosis present

## 2019-12-28 DIAGNOSIS — R9431 Abnormal electrocardiogram [ECG] [EKG]: Secondary | ICD-10-CM | POA: Diagnosis not present

## 2019-12-28 DIAGNOSIS — Z8501 Personal history of malignant neoplasm of esophagus: Secondary | ICD-10-CM

## 2019-12-28 DIAGNOSIS — I214 Non-ST elevation (NSTEMI) myocardial infarction: Principal | ICD-10-CM | POA: Diagnosis present

## 2019-12-28 DIAGNOSIS — D500 Iron deficiency anemia secondary to blood loss (chronic): Secondary | ICD-10-CM

## 2019-12-28 DIAGNOSIS — Z91010 Allergy to peanuts: Secondary | ICD-10-CM

## 2019-12-28 DIAGNOSIS — Z7951 Long term (current) use of inhaled steroids: Secondary | ICD-10-CM

## 2019-12-28 DIAGNOSIS — Z8261 Family history of arthritis: Secondary | ICD-10-CM

## 2019-12-28 DIAGNOSIS — Z880 Allergy status to penicillin: Secondary | ICD-10-CM

## 2019-12-28 LAB — BASIC METABOLIC PANEL
Anion gap: 10 (ref 5–15)
BUN: 41 mg/dL — ABNORMAL HIGH (ref 8–23)
CO2: 21 mmol/L — ABNORMAL LOW (ref 22–32)
Calcium: 9.1 mg/dL (ref 8.9–10.3)
Chloride: 107 mmol/L (ref 98–111)
Creatinine, Ser: 1.74 mg/dL — ABNORMAL HIGH (ref 0.61–1.24)
GFR calc Af Amer: 42 mL/min — ABNORMAL LOW (ref >60)
GFR calc non Af Amer: 36 mL/min — ABNORMAL LOW (ref >60)
Glucose, Bld: 270 mg/dL — ABNORMAL HIGH (ref 70–99)
Potassium: 4.5 mmol/L (ref 3.5–5.1)
Sodium: 138 mmol/L (ref 135–145)

## 2019-12-28 LAB — CBC
HCT: 36.3 % — ABNORMAL LOW (ref 39.0–52.0)
Hemoglobin: 11.7 g/dL — ABNORMAL LOW (ref 13.0–17.0)
MCH: 30.2 pg (ref 26.0–34.0)
MCHC: 32.2 g/dL (ref 30.0–36.0)
MCV: 93.8 fL (ref 80.0–100.0)
Platelets: 292 10*3/uL (ref 150–400)
RBC: 3.87 MIL/uL — ABNORMAL LOW (ref 4.22–5.81)
RDW: 14.6 % (ref 11.5–15.5)
WBC: 8.9 10*3/uL (ref 4.0–10.5)
nRBC: 0 % (ref 0.0–0.2)

## 2019-12-28 LAB — FERRITIN: Ferritin: 73 ng/mL (ref 24–336)

## 2019-12-28 LAB — COMPREHENSIVE METABOLIC PANEL
ALT: 15 U/L (ref 0–44)
AST: 14 U/L — ABNORMAL LOW (ref 15–41)
Albumin: 4.2 g/dL (ref 3.5–5.0)
Alkaline Phosphatase: 86 U/L (ref 38–126)
Anion gap: 10 (ref 5–15)
BUN: 42 mg/dL — ABNORMAL HIGH (ref 8–23)
CO2: 22 mmol/L (ref 22–32)
Calcium: 9.2 mg/dL (ref 8.9–10.3)
Chloride: 105 mmol/L (ref 98–111)
Creatinine, Ser: 1.98 mg/dL — ABNORMAL HIGH (ref 0.61–1.24)
GFR calc Af Amer: 36 mL/min — ABNORMAL LOW (ref >60)
GFR calc non Af Amer: 31 mL/min — ABNORMAL LOW (ref >60)
Glucose, Bld: 249 mg/dL — ABNORMAL HIGH (ref 70–99)
Potassium: 5.2 mmol/L — ABNORMAL HIGH (ref 3.5–5.1)
Sodium: 137 mmol/L (ref 135–145)
Total Bilirubin: 0.6 mg/dL (ref 0.3–1.2)
Total Protein: 7.3 g/dL (ref 6.5–8.1)

## 2019-12-28 LAB — IRON AND TIBC
Iron: 61 ug/dL (ref 45–182)
Saturation Ratios: 18 % (ref 17.9–39.5)
TIBC: 336 ug/dL (ref 250–450)
UIBC: 275 ug/dL

## 2019-12-28 LAB — CBC WITH DIFFERENTIAL/PLATELET
Abs Immature Granulocytes: 0.06 10*3/uL (ref 0.00–0.07)
Basophils Absolute: 0.1 10*3/uL (ref 0.0–0.1)
Basophils Relative: 1 %
Eosinophils Absolute: 0.6 10*3/uL — ABNORMAL HIGH (ref 0.0–0.5)
Eosinophils Relative: 8 %
HCT: 37.2 % — ABNORMAL LOW (ref 39.0–52.0)
Hemoglobin: 11.7 g/dL — ABNORMAL LOW (ref 13.0–17.0)
Immature Granulocytes: 1 %
Lymphocytes Relative: 12 %
Lymphs Abs: 0.9 10*3/uL (ref 0.7–4.0)
MCH: 29.6 pg (ref 26.0–34.0)
MCHC: 31.5 g/dL (ref 30.0–36.0)
MCV: 94.2 fL (ref 80.0–100.0)
Monocytes Absolute: 0.6 10*3/uL (ref 0.1–1.0)
Monocytes Relative: 8 %
Neutro Abs: 5.3 10*3/uL (ref 1.7–7.7)
Neutrophils Relative %: 70 %
Platelets: 279 10*3/uL (ref 150–400)
RBC: 3.95 MIL/uL — ABNORMAL LOW (ref 4.22–5.81)
RDW: 14.6 % (ref 11.5–15.5)
WBC: 7.6 10*3/uL (ref 4.0–10.5)
nRBC: 0 % (ref 0.0–0.2)

## 2019-12-28 LAB — TROPONIN I (HIGH SENSITIVITY): Troponin I (High Sensitivity): 23 ng/L — ABNORMAL HIGH (ref <18)

## 2019-12-28 LAB — VITAMIN B12: Vitamin B-12: 387 pg/mL (ref 180–914)

## 2019-12-28 NOTE — ED Provider Notes (Signed)
[facility] Emergency Department Provider Note  ____________________________________________   First MD Initiated Contact with Patient 12/28/19 2300     (approximate)  I have reviewed the triage vital signs and the nursing notes.   HISTORY  Chief Complaint Chest Pain    HPI Mike Decker is a 79 y.o. male with medical history as listed below who was also recently hospitalized for chest pain with demand ischemia versus NSTEMI who presents tonight for evaluation of acute onset and severe sharp and pressure-like severe chest pain.  He reports that he had a good day today and in fact was over at the hospital getting blood work drawn for outpatient labs by his primary care doctor, Dr. Lavera Guise (his cardiologist is Dr. Clayborn Bigness).  He says that tonight after taking the dog outside he developed left arm pain that was sharp which then spread into the left side of his chest and even up into the left side of his jaw.  He took a total of 3 nitroglycerin and they helped and the pain well a briefly but then came back at least as strong a second time at which point he called 911.  He said that currently the pain is much better and almost gone but is still slightly present in the center and left side of his chest.  The symptoms were associated with nausea but no vomiting.  He has been isolating and has had no contact with COVID-19 patients.  He denies sore throat, fever/chills, shortness of breath, cough, vomiting, abdominal pain, and dysuria.   He reports that he is compliant with his medication regimen.  During his last hospitalization the decision was made to manage him medically and he has chronic kidney disease which is one of the reasons he did not receive a catheterization.        Past Medical History:  Diagnosis Date  . Anemia   . Arthritis   . Atrioventricular canal (AVC)    irregular heart beats  . Barrett esophagus   . 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 Chronic diastolic CHF (congestive heart failure) ([place])   . Colon polyp   . Diabetes mellitus without complication ([place])   . Diverticulosis   . Gout   . Heart disease   . Hemangioma    liver  . Hyperlipidemia   . Hypertension   . Myocardial infarct ([place])   . Ocular hypertension   . Peripheral vascular disease ([place])   . Skin cancer   . Sleep apnea   . Vitreoretinal degeneration     Patient Active Problem List   Diagnosis Date Noted  . Type II diabetes mellitus with renal manifestations ([place]) 12/14/2019  . Elevated troponin   . DM type 2 with diabetic peripheral neuropathy ([place]) 01/25/2019  . CKD (chronic kidney disease) stage 4, GFR 15-29 ml/min (HCC) 01/21/2019  . Gout 01/21/2019  . MR (mitral regurgitation) 01/21/2019  . COPD (chronic obstructive pulmonary disease) ([place]) 01/08/2019  . OSA (obstructive sleep apnea) 01/08/2019  . Chronic diastolic heart failure ([place]) 12/30/2018  . Polyp of sigmoid colon   . Benign neoplasm of ascending colon   . Hemorrhoids without complication   . Diverticulosis of large intestine without diverticulitis   . Reflux esophagitis   . Lymphangiectasia   . Arteriovenous malformation of duodenum   . Symptomatic anemia   . SOB (shortness of breath)   . Iron deficiency anemia due to chronic blood loss   . Unstable  angina ([place])   . Anemia of chronic renal failure, stage 4 (severe) ([place])   . Chest pain 11/14/2018  . Sciatica 11/12/2018  . CAD (coronary artery disease) 02/27/2018  . Sacroiliac joint pain 10/08/2017  . Pain in limb 09/24/2017  . Neuropathy 07/24/2017  . NSTEMI (non-ST elevated myocardial infarction) ([place]) 05/06/2017  . PAD (peripheral artery disease) ([place]) 05/01/2017  . History of stroke 01/02/2017  . Barrett's esophagus 10/02/2016  . Carotid stenosis 10/02/2016  . Essential hypertension 10/02/2016  . Hyperlipidemia 10/02/2016  . Diabetes ([place]) 10/02/2016  . 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  Uncontrolled type 2 diabetes mellitus with hyperglycemia, with long-term current use of insulin ([place]) 04/10/2016  . TIA (transient ischemic attack) 10/08/2015    Past Surgical History:  Procedure Laterality Date  . CATARACT EXTRACTION  2011, 2012  . COLONOSCOPY N/A 12/14/2018   Procedure: COLONOSCOPY;  Surgeon: Virgel Manifold, MD;  Location: [facility] ENDOSCOPY;  Service: Endoscopy;  Laterality: N/A;  . CORONARY ANGIOPLASTY WITH STENT PLACEMENT  2012  . ESOPHAGOGASTRODUODENOSCOPY N/A 12/14/2018   Procedure: ESOPHAGOGASTRODUODENOSCOPY (EGD);  Surgeon: Virgel Manifold, MD;  Location: [facility] ENDOSCOPY;  Service: Endoscopy;  Laterality: N/A;  . ESOPHAGOGASTRODUODENOSCOPY (EGD) WITH PROPOFOL N/A 06/01/2016   Procedure: ESOPHAGOGASTRODUODENOSCOPY (EGD) WITH PROPOFOL;  Surgeon: Manya Silvas, MD;  Location: [facility] ENDOSCOPY;  Service: Endoscopy;  Laterality: N/A;  . HERNIA REPAIR     x2  . INTRAOCULAR LENS INSERTION    . LEFT HEART CATH AND CORONARY ANGIOGRAPHY N/A 05/09/2017   Procedure: Left Heart Cath and Coronary Angiography;  Surgeon: Yolonda Kida, MD;  Location: [facility] LAB;  Service: Cardiovascular;  Laterality: N/A;  . LEFT HEART CATH AND CORONARY ANGIOGRAPHY N/A 12/08/2018   Procedure: LEFT HEART CATH AND CORONARY ANGIOGRAPHY and possible PCI and stent;  Surgeon: Yolonda Kida, MD;  Location: [facility];  Service: Cardiovascular;  Laterality: N/A;  . NOSE SURGERY     submucous resection  . PROSTATE SURGERY  2002  . ROTATOR CUFF REPAIR Right     Prior to Admission medications   Medication Sig Start Date End Date Taking? Authorizing Provider  acetaminophen (TYLENOL) 500 MG tablet Take 1,000 mg by mouth every 6 (six) hours as needed for mild pain, moderate pain or headache.   Yes [provider]  allopurinol (ZYLOPRIM) 100 MG tablet Take 100 mg by mouth daily.    Yes [provider]  amLODipine (NORVASC) 5 MG tablet Take 5 mg by mouth  daily.  12/31/18  Yes [provider]  aspirin EC 81 MG EC tablet Take 1 tablet (81 mg total) by mouth daily. 12/18/19  Yes Amin, Jeanella Flattery, MD  budesonide-formoterol (SYMBICORT) 160-4.5 MCG/ACT inhaler Inhale 2 puffs into the lungs 2 (two) times daily as needed.    Yes [provider]  cetirizine (ZYRTEC) 5 MG tablet Take 5 mg by mouth daily as needed for allergies or rhinitis.    Yes [provider]  citalopram (CELEXA) 10 MG tablet Take 10 mg by mouth daily.   Yes [provider]  clopidogrel (PLAVIX) 75 MG tablet Take 1 tablet (75 mg total) by mouth daily. 10/09/15  Yes Mody, Ulice Bold, MD  colchicine 0.6 MG tablet Take 1 tablet (0.6 mg total) by mouth daily. Patient taking differently: Take 1.8 mg by mouth daily as needed (gout flares).  11/17/18  Yes Mody, Ulice Bold, MD  dextromethorphan-guaiFENesin (MUCINEX DM) 30-600 MG 12hr tablet Take 1 tablet  by mouth 2 (two) times daily as needed for cough.   Yes [provider]  EPINEPHrine (EPI-PEN) 0.3 mg/0.3 mL SOAJ injection Inject into the muscle as needed (anaphylaxis).    Yes [provider]  ferrous sulfate 325 (65 FE) MG EC tablet Take 1 tablet (325 mg total) by mouth daily. 11/10/19  Yes Earlie Server, MD  fluticasone [facility]) 50 MCG/ACT nasal spray Place 2 sprays into both nostrils 2 (two) times daily.  11/04/18  Yes [provider]  folic acid (V-R FOLIC ACID) 518 MCG tablet Take 1 tablet (400 mcg total) by mouth daily. 12/30/18  Yes Earlie Server, MD  furosemide (LASIX) 20 MG tablet Take 2 tablets (40 mg total) by mouth daily. 11/17/18  Yes Bettey Costa, MD  hydrALAZINE (APRESOLINE) 25 MG tablet Take 1 tablet (25 mg total) by mouth every 8 (eight) hours. 12/17/19  Yes Amin, Ankit Chirag, MD  insulin aspart (NOVOLOG FLEXPEN) 100 UNIT/ML FlexPen Inject 24-36 Units into the skin See admin instructions. 14 units at breakfast 24 units at lunch 24 at dinner Per sliding scale   Yes [provider]    insulin glargine (LANTUS) 100 UNIT/ML injection Inject 0.45 mLs (45 Units total) into the skin at bedtime. 12/17/19  Yes Amin, Ankit Chirag, MD  ipratropium-albuterol (DUONEB) 0.5-2.5 (3) MG/3ML SOLN Take 3 mLs by nebulization every 4 (four) hours as needed. 05/15/17  Yes Hillary Bow, MD  isosorbide mononitrate (IMDUR) 60 MG 24 hr tablet Take 60 mg by mouth daily.  01/28/19  Yes [provider]  Magnesium 250 MG TABS Take 250 mg by mouth daily.   Yes [provider]  methocarbamol (ROBAXIN) 500 MG tablet Take 500 mg by mouth 2 (two) times daily.  11/08/18  Yes [provider]  metoprolol tartrate (LOPRESSOR) 50 MG tablet Take 75 mg by mouth 2 (two) times daily.    Yes [provider]  montelukast (SINGULAIR) 10 MG tablet TAKE 1 TABLET BY MOUTH EVERYDAY AT BEDTIME 12/14/19  Yes [place], Audie Clear, NP  Omega-3 Fatty Acids (FISH OIL) 1000 MG CAPS Take 1 capsule by mouth daily.    Yes [provider]  pantoprazole (PROTONIX) 40 MG tablet Take 1 tablet (40 mg total) by mouth 2 (two) times daily before a meal. 12/17/19  Yes Amin, Ankit Chirag, MD  rosuvastatin (CRESTOR) 40 MG tablet Take 40 mg by mouth every evening.    Yes [provider]  sacubitril-valsartan (ENTRESTO) 24-26 MG Take 1 tablet by mouth 2 (two) times daily. 12/17/19  Yes Amin, Jeanella Flattery, MD  vitamin C (ASCORBIC ACID) 250 MG tablet Take 250 mg by mouth daily.   Yes [provider]  Vitamin D, Ergocalciferol, (DRISDOL) 50000 units CAPS capsule Take 50,000 Units by mouth every 30 (thirty) days.    Yes [provider]  zinc gluconate 50 MG tablet Take 50 mg by mouth daily.   Yes [provider]  nitroGLYCERIN (NITROSTAT) 0.4 MG SL tablet Place 0.4 mg under the tongue every 5 (five) minutes x 3 doses as needed for chest pain.     [provider]    Allergies Gabapentin, Peanut-containing drug products, Penicillins, Bee venom, Influenza vaccines, Inh  [isoniazid], Kenalog [triamcinolone acetonide], Levaquin [levofloxacin], Nalfon [fenoprofen calcium], Naproxen, Peanut oil, and Nsaids  Family History  Problem Relation Age of Onset  . Arthritis Mother   . Stroke Maternal Grandfather   . Breast cancer Neg Hx     Social History Social History  Tobacco Use  . Smoking status: Former Smoker    Years: 20.00    Types: Cigarettes    Quit date: 12/10/1976    Years since quitting: 43.0  . Smokeless tobacco: Never Used  Substance Use Topics  . Alcohol use: No  . Drug use: No    Review of Systems Constitutional: No fever/chills Eyes: No visual changes. ENT: No sore throat. Cardiovascular: +chest pain. Respiratory: Denies shortness of breath. Gastrointestinal: No abdominal pain.  Nausea, no vomiting.  No diarrhea.  No constipation. Genitourinary: Negative for dysuria. Musculoskeletal: Negative for neck pain.  Negative for back pain. Integumentary: Negative for rash. Neurological: Negative for headaches, focal weakness or numbness.   ____________________________________________   PHYSICAL EXAM:  VITAL SIGNS: ED Triage Vitals  Enc Vitals Group     BP 12/28/19 2224 (!) 196/80     Pulse Rate 12/28/19 2224 93     Resp 12/28/19 2224 18     Temp 12/28/19 2217 97.9 F (36.6 C)     Temp Source 12/28/19 2217 Oral     SpO2 12/28/19 2224 98 %     Weight 12/28/19 2213 89.3 kg (196 lb 14.4 oz)     Height 12/28/19 2213 1.676 m (5\' 6" )     Head Circumference --      Peak Flow --      Pain Score 12/28/19 2213 7     Pain Loc --      Pain Edu? --      Excl. in [place]? --     Constitutional: Alert and oriented.  Eyes: Conjunctivae are normal.  Head: Atraumatic. Nose: No congestion/rhinnorhea. Mouth/Throat: Patient is wearing a mask. Neck: No stridor.  No meningeal signs.   Cardiovascular: Normal rate, regular rhythm. Good peripheral circulation. Grossly normal heart sounds. Respiratory: Normal respiratory effort.  No  retractions. Gastrointestinal: Soft and nontender. No distention.  Musculoskeletal: No lower extremity tenderness nor edema. No gross deformities of extremities. Neurologic:  Normal speech and language. No gross focal neurologic deficits are appreciated.  Skin:  Skin is warm, dry and intact. Psychiatric: Mood and affect are normal. Speech and behavior are normal.  ____________________________________________   LABS (all labs ordered are listed, but only abnormal results are displayed)  Labs Reviewed  BASIC METABOLIC PANEL - Abnormal; Notable for the following components:      Result Value   CO2 21 (*)    Glucose, Bld 270 (*)    BUN 41 (*)    Creatinine, Ser 1.74 (*)    GFR calc non Af Amer 36 (*)    GFR calc Af Amer 42 (*)    All other components within normal limits  CBC - Abnormal; Notable for the following components:   RBC 3.87 (*)    Hemoglobin 11.7 (*)    HCT 36.3 (*)    All other components within normal limits  TROPONIN I (HIGH SENSITIVITY) - Abnormal; Notable for the following components:   Troponin I (High Sensitivity) 23 (*)    All other components within normal limits  TROPONIN I (HIGH SENSITIVITY) - Abnormal; Notable for the following components:   Troponin I (High Sensitivity) 373 (*)    All other components within normal limits  RESPIRATORY PANEL BY RT PCR (FLU A&B, COVID)  FERRITIN  LACTATE DEHYDROGENASE  TROPONIN I (HIGH SENSITIVITY)   ____________________________________________  EKG  ED ECG REPORT I, Hinda Kehr, the attending physician, personally viewed and interpreted this ECG.  Date: 12/28/2019 EKG Time: 22: 20 Rate: 95 Rhythm:  normal sinus rhythm with occasional PVC QRS Axis: normal Intervals: normal ST/T Wave abnormalities: Non-specific ST segment / T-wave changes, but no clear evidence of acute ischemia. Narrative Interpretation: no definitive evidence of acute ischemia; does not meet STEMI  criteria.   ____________________________________________  RADIOLOGY I, Hinda Kehr, personally viewed and evaluated these images (plain radiographs) as part of my medical decision making, as well as reviewing the written report by the radiologist.  ED MD interpretation: No indication of acute abnormalities on chest x-ray, improving bilateral subpleural opacities as per radiology from prior image about 2 weeks ago.  Official radiology report(s): DG Chest 2 View  Result Date: 12/28/2019 CLINICAL DATA:  Chest pain. EXAM: CHEST - 2 VIEW COMPARISON:  Radiograph 12/21/2019, CT 12/11/2018 FINDINGS: The cardiomediastinal contours are normal. Bilateral subpleural opacities have improved from prior exam. Pulmonary vasculature is normal. No consolidation, pleural effusion, or pneumothorax. No acute osseous abnormalities are seen. IMPRESSION: No acute chest findings. Improving bilateral subpleural opacities. Electronically Signed   By: Keith Rake M.D.   On: 12/28/2019 22:45    ____________________________________________   PROCEDURES   Procedure(s) performed (including Critical Care):  .Critical Care Performed by: Hinda Kehr, MD Authorized by: Hinda Kehr, MD   Critical care provider statement:    Critical care time (minutes):  30   Critical care time was exclusive of:  Separately billable procedures and treating other patients   Critical care was necessary to treat or prevent imminent or life-threatening deterioration of the following conditions: NSTEMI.   Critical care was time spent personally by me on the following activities:  Development of treatment plan with patient or surrogate, discussions with consultants, evaluation of patient's response to treatment, examination of patient, obtaining history from patient or surrogate, ordering and performing treatments and interventions, ordering and review of laboratory studies, ordering and review of radiographic studies, pulse  oximetry, re-evaluation of patient's condition and review of old charts     ____________________________________________   [place] / MDM / [place] / ED COURSE  As part of my medical decision making, I reviewed the following data within the [place] notes reviewed and incorporated, Labs reviewed , EKG interpreted , Radiograph reviewed , Discussed with admitting physician  and Notes from prior ED visits   Differential diagnosis includes, but is not limited to, the full spectrum of ACS including angina, unstable angina, versus NSTEMI, musculoskeletal pain, PE, pneumonia, COVID-19, uncontrolled hypertension, anxiety.  The patient is generally well-appearing at this time, conversant, and does not appear to be in distress, but he still reports a little bit of residual chest pain.  He said that he has had anginal pain in the past but that this was stronger.  Based on reviewing his medical record he is not a good candidate for catheterization and is being managed medically.  He has no obvious ischemic changes on his EKG.  His troponin was up considerably higher in the past and his first troponin tonight was 23 which is relatively reassuring.  CBC is stable, basic metabolic is stable including his BUN and creatinine.  We discussed the plan and agreed that we would keep him on the monitor for the time being and recheck a second high-sensitivity troponin and then reassess, likely with a call to cardiology (Dr. Ubaldo Glassing) to discuss the appropriate disposition.      Clinical Course as of Dec 28 137  Mon Dec 28, 2019  2328 Of note, in addition to the 3 nitroglycerin he took  at home, the patient has also taken a full dose aspirin.   [CF]  Tue Dec 29, 2019  0122 Second troponin has gone up to 373.  This is consistent with NSTEMI.  I have started heparin bolus plus infusion, will talk to the patient, and then will admit to the hospitalist service.  Troponin I  (High Sensitivity)(!!): 373 [CF]  0124 Patient reports chest pain/pressure about 4/10.  Since NTG helped before, will order NTG 1" paste   [CF]  0126 Ordering 2-hour COVID test just in case cardiology decides to go to cath lab before the 6-24 hour test results.   [CF]  0138 Discussed with Dr. Sidney Ace who will admit.   [CF]    Clinical Course User Index [CF] Hinda Kehr, MD     ____________________________________________  FINAL CLINICAL IMPRESSION(S) / ED DIAGNOSES  Final diagnoses:  NSTEMI (non-ST elevated myocardial infarction) ([place])  Chronic kidney disease, unspecified CKD stage     MEDICATIONS GIVEN DURING THIS VISIT:  Medications  nitroGLYCERIN (NITROGLYN) 2 % ointment 1 inch (has no administration in time range)     ED Discharge Orders    None      *Please note:  Coden Franchi was evaluated in Emergency Department on 12/29/2019 for the symptoms described in the history of present illness. He was evaluated in the context of the global COVID-19 pandemic, which necessitated consideration that the patient might be at risk for infection with the SARS-CoV-2 virus that causes COVID-19. Institutional protocols and algorithms that pertain to the evaluation of patients at risk for COVID-19 are in a state of rapid change based on information released by regulatory bodies including the CDC and federal and state organizations. These policies and algorithms were followed during the patient's care in the ED.  Some ED evaluations and interventions may be delayed as a result of limited staffing during the pandemic.*  Note:  This document was prepared using Dragon voice recognition software and may include unintentional dictation errors.   Hinda Kehr, MD 12/29/19 [phone]

## 2019-12-28 NOTE — ED Triage Notes (Addendum)
Pt arrives via [facility] from home. Pt complaining of left sided sharp arm pain which radiates to his jaw. He also has chest discomfort (7/10). Pt took x3 nitro and 324g aspirin at home.   Pt was here for the same 2 weeks ago with elevated troponins and atrial fibrillation.   Pt has history of renal failure and was unable to have cath on previous visit.  EMS EKG unremarkable, BP 190/93.

## 2019-12-28 NOTE — ED Notes (Signed)
Pt using urinal without assistance, urinal at bedside.

## 2019-12-29 ENCOUNTER — Other Ambulatory Visit: Payer: Self-pay | Admitting: Oncology

## 2019-12-29 ENCOUNTER — Inpatient Hospital Stay
Admit: 2019-12-29 | Discharge: 2019-12-29 | Disposition: A | Discharge status: Home / Self Care | Payer: Medicare Other | Attending: Family Medicine | Admitting: Family Medicine

## 2019-12-29 ENCOUNTER — Encounter
Admission: EM | Disposition: A | Discharge status: Home / Self Care | Payer: Self-pay | Source: Home / Self Care | Attending: Internal Medicine

## 2019-12-29 DIAGNOSIS — Z20822 Contact with and (suspected) exposure to covid-19: Secondary | ICD-10-CM | POA: Diagnosis present

## 2019-12-29 DIAGNOSIS — I10 Essential (primary) hypertension: Secondary | ICD-10-CM | POA: Diagnosis not present

## 2019-12-29 DIAGNOSIS — N1832 Chronic kidney disease, stage 3b: Secondary | ICD-10-CM | POA: Diagnosis present

## 2019-12-29 DIAGNOSIS — I2510 Atherosclerotic heart disease of native coronary artery without angina pectoris: Secondary | ICD-10-CM

## 2019-12-29 DIAGNOSIS — E1121 Type 2 diabetes mellitus with diabetic nephropathy: Secondary | ICD-10-CM

## 2019-12-29 DIAGNOSIS — E538 Deficiency of other specified B group vitamins: Secondary | ICD-10-CM

## 2019-12-29 DIAGNOSIS — J181 Lobar pneumonia, unspecified organism: Secondary | ICD-10-CM | POA: Diagnosis present

## 2019-12-29 DIAGNOSIS — M109 Gout, unspecified: Secondary | ICD-10-CM | POA: Diagnosis present

## 2019-12-29 DIAGNOSIS — J440 Chronic obstructive pulmonary disease with acute lower respiratory infection: Secondary | ICD-10-CM | POA: Diagnosis present

## 2019-12-29 DIAGNOSIS — N1831 Chronic kidney disease, stage 3a: Secondary | ICD-10-CM

## 2019-12-29 DIAGNOSIS — I509 Heart failure, unspecified: Secondary | ICD-10-CM | POA: Diagnosis not present

## 2019-12-29 DIAGNOSIS — I5032 Chronic diastolic (congestive) heart failure: Secondary | ICD-10-CM | POA: Diagnosis present

## 2019-12-29 DIAGNOSIS — Z8673 Personal history of transient ischemic attack (TIA), and cerebral infarction without residual deficits: Secondary | ICD-10-CM | POA: Diagnosis not present

## 2019-12-29 DIAGNOSIS — I214 Non-ST elevation (NSTEMI) myocardial infarction: Principal | ICD-10-CM

## 2019-12-29 DIAGNOSIS — E785 Hyperlipidemia, unspecified: Secondary | ICD-10-CM

## 2019-12-29 DIAGNOSIS — I252 Old myocardial infarction: Secondary | ICD-10-CM | POA: Diagnosis not present

## 2019-12-29 DIAGNOSIS — I4891 Unspecified atrial fibrillation: Secondary | ICD-10-CM | POA: Diagnosis present

## 2019-12-29 DIAGNOSIS — Z7982 Long term (current) use of aspirin: Secondary | ICD-10-CM | POA: Diagnosis not present

## 2019-12-29 DIAGNOSIS — N189 Chronic kidney disease, unspecified: Secondary | ICD-10-CM

## 2019-12-29 DIAGNOSIS — F329 Major depressive disorder, single episode, unspecified: Secondary | ICD-10-CM | POA: Diagnosis present

## 2019-12-29 DIAGNOSIS — Z7902 Long term (current) use of antithrombotics/antiplatelets: Secondary | ICD-10-CM | POA: Diagnosis not present

## 2019-12-29 DIAGNOSIS — E1151 Type 2 diabetes mellitus with diabetic peripheral angiopathy without gangrene: Secondary | ICD-10-CM | POA: Diagnosis present

## 2019-12-29 DIAGNOSIS — I130 Hypertensive heart and chronic kidney disease with heart failure and stage 1 through stage 4 chronic kidney disease, or unspecified chronic kidney disease: Secondary | ICD-10-CM | POA: Diagnosis present

## 2019-12-29 DIAGNOSIS — E1122 Type 2 diabetes mellitus with diabetic chronic kidney disease: Secondary | ICD-10-CM | POA: Diagnosis present

## 2019-12-29 DIAGNOSIS — Z7951 Long term (current) use of inhaled steroids: Secondary | ICD-10-CM | POA: Diagnosis not present

## 2019-12-29 DIAGNOSIS — F419 Anxiety disorder, unspecified: Secondary | ICD-10-CM | POA: Diagnosis present

## 2019-12-29 DIAGNOSIS — D500 Iron deficiency anemia secondary to blood loss (chronic): Secondary | ICD-10-CM

## 2019-12-29 DIAGNOSIS — E11649 Type 2 diabetes mellitus with hypoglycemia without coma: Secondary | ICD-10-CM | POA: Diagnosis not present

## 2019-12-29 DIAGNOSIS — Z955 Presence of coronary angioplasty implant and graft: Secondary | ICD-10-CM | POA: Diagnosis not present

## 2019-12-29 DIAGNOSIS — G4733 Obstructive sleep apnea (adult) (pediatric): Secondary | ICD-10-CM | POA: Diagnosis present

## 2019-12-29 DIAGNOSIS — Z87891 Personal history of nicotine dependence: Secondary | ICD-10-CM | POA: Diagnosis not present

## 2019-12-29 HISTORY — PX: CORONARY STENT INTERVENTION: CATH118234

## 2019-12-29 HISTORY — PX: LEFT HEART CATH AND CORONARY ANGIOGRAPHY: CATH118249

## 2019-12-29 LAB — GLUCOSE, CAPILLARY
Glucose-Capillary: 176 mg/dL — ABNORMAL HIGH (ref 70–99)
Glucose-Capillary: 181 mg/dL — ABNORMAL HIGH (ref 70–99)
Glucose-Capillary: 251 mg/dL — ABNORMAL HIGH (ref 70–99)
Glucose-Capillary: 255 mg/dL — ABNORMAL HIGH (ref 70–99)
Glucose-Capillary: 224 mg/dL — ABNORMAL HIGH (ref 70–99)

## 2019-12-29 LAB — BASIC METABOLIC PANEL
Anion gap: 11 (ref 5–15)
BUN: 42 mg/dL — ABNORMAL HIGH (ref 8–23)
CO2: 19 mmol/L — ABNORMAL LOW (ref 22–32)
Calcium: 8.9 mg/dL (ref 8.9–10.3)
Chloride: 110 mmol/L (ref 98–111)
Creatinine, Ser: 1.67 mg/dL — ABNORMAL HIGH (ref 0.61–1.24)
GFR calc Af Amer: 44 mL/min — ABNORMAL LOW (ref >60)
GFR calc non Af Amer: 38 mL/min — ABNORMAL LOW (ref >60)
Glucose, Bld: 248 mg/dL — ABNORMAL HIGH (ref 70–99)
Potassium: 4.5 mmol/L (ref 3.5–5.1)
Sodium: 140 mmol/L (ref 135–145)

## 2019-12-29 LAB — APTT: aPTT: 34 seconds (ref 24–36)

## 2019-12-29 LAB — ECHOCARDIOGRAM COMPLETE
Height: 66 in
Weight: 3158.4 oz

## 2019-12-29 LAB — CBC
HCT: 34.7 % — ABNORMAL LOW (ref 39.0–52.0)
Hemoglobin: 11.1 g/dL — ABNORMAL LOW (ref 13.0–17.0)
MCH: 29.8 pg (ref 26.0–34.0)
MCHC: 32 g/dL (ref 30.0–36.0)
MCV: 93.3 fL (ref 80.0–100.0)
Platelets: 270 10*3/uL (ref 150–400)
RBC: 3.72 MIL/uL — ABNORMAL LOW (ref 4.22–5.81)
RDW: 14.6 % (ref 11.5–15.5)
WBC: 10.9 10*3/uL — ABNORMAL HIGH (ref 4.0–10.5)
nRBC: 0 % (ref 0.0–0.2)

## 2019-12-29 LAB — RESPIRATORY PANEL BY RT PCR (FLU A&B, COVID)
Influenza A by PCR: NEGATIVE
Influenza B by PCR: NEGATIVE
SARS Coronavirus 2 by RT PCR: NEGATIVE

## 2019-12-29 LAB — LIPID PANEL
Cholesterol: 115 mg/dL (ref 0–200)
HDL: 31 mg/dL — ABNORMAL LOW (ref >40)
LDL Cholesterol: 51 mg/dL (ref 0–99)
Total CHOL/HDL Ratio: 3.7 RATIO
Triglycerides: 163 mg/dL — ABNORMAL HIGH (ref <150)
VLDL: 33 mg/dL (ref 0–40)

## 2019-12-29 LAB — PROTIME-INR
INR: 1 (ref 0.8–1.2)
Prothrombin Time: 12.6 seconds (ref 11.4–15.2)

## 2019-12-29 LAB — LACTATE DEHYDROGENASE: LDH: 176 U/L (ref 98–192)

## 2019-12-29 LAB — TROPONIN I (HIGH SENSITIVITY)
Troponin I (High Sensitivity): 373 ng/L (ref <18)
Troponin I (High Sensitivity): 2426 ng/L (ref <18)

## 2019-12-29 LAB — HEPARIN LEVEL (UNFRACTIONATED): Heparin Unfractionated: 0.37 IU/mL (ref 0.30–0.70)

## 2019-12-29 LAB — FERRITIN: Ferritin: 68 ng/mL (ref 24–336)

## 2019-12-29 LAB — POCT ACTIVATED CLOTTING TIME: Activated Clotting Time: 257 seconds

## 2019-12-29 SURGERY — LEFT HEART CATH AND CORONARY ANGIOGRAPHY
Anesthesia: Moderate Sedation

## 2019-12-29 MED ORDER — SODIUM CHLORIDE 0.9% FLUSH
3.0000 mL | Freq: Two times a day (BID) | INTRAVENOUS | Status: DC
  Administered 2019-12-29 – 2019-12-30 (×2): 3 mL via INTRAVENOUS

## 2019-12-29 MED ORDER — ZOLPIDEM TARTRATE 5 MG PO TABS
5.0000 mg | ORAL_TABLET | Freq: Every evening | ORAL | Status: DC | PRN
  Administered 2019-12-29: 22:00:00 5 mg via ORAL
  Filled 2019-12-29: qty 1

## 2019-12-29 MED ORDER — HEPARIN SODIUM (PORCINE) 1000 UNIT/ML IJ SOLN
INTRAMUSCULAR | Status: AC
  Filled 2019-12-29: qty 1

## 2019-12-29 MED ORDER — NITROGLYCERIN 1 MG/10 ML FOR IR/CATH LAB
INTRA_ARTERIAL | Status: AC
  Filled 2019-12-29: qty 10

## 2019-12-29 MED ORDER — FOLIC ACID 1 MG PO TABS
500.0000 ug | ORAL_TABLET | Freq: Every day | ORAL | Status: DC
  Administered 2019-12-29 – 2019-12-30 (×2): 0.5 mg via ORAL
  Filled 2019-12-29 (×2): qty 1

## 2019-12-29 MED ORDER — ASPIRIN 81 MG PO CHEW
CHEWABLE_TABLET | ORAL | Status: DC | PRN
  Administered 2019-12-29: 81 mg via ORAL

## 2019-12-29 MED ORDER — SODIUM CHLORIDE 0.9 % WEIGHT BASED INFUSION: 3.0000 mL/kg/h | INTRAVENOUS | Status: DC

## 2019-12-29 MED ORDER — VITAMIN D (ERGOCALCIFEROL) 1.25 MG (50000 UNIT) PO CAPS: 50000.0000 [IU] | ORAL_CAPSULE | ORAL | Status: DC

## 2019-12-29 MED ORDER — ALUM & MAG HYDROXIDE-SIMETH 200-200-20 MG/5ML PO SUSP
15.0000 mL | ORAL | Status: DC | PRN
  Administered 2019-12-29: 11:00:00 15 mL via ORAL
  Filled 2019-12-29: qty 30

## 2019-12-29 MED ORDER — ROSUVASTATIN CALCIUM 10 MG PO TABS
40.0000 mg | ORAL_TABLET | Freq: Every evening | ORAL | Status: DC
  Administered 2019-12-29: 22:00:00 40 mg via ORAL
  Filled 2019-12-29: qty 2

## 2019-12-29 MED ORDER — SODIUM CHLORIDE 0.9% FLUSH: 3.0000 mL | INTRAVENOUS | Status: DC | PRN

## 2019-12-29 MED ORDER — SODIUM CHLORIDE 0.9 % IV SOLN: 250.0000 mL | INTRAVENOUS | Status: DC | PRN

## 2019-12-29 MED ORDER — MIDAZOLAM HCL 2 MG/2ML IJ SOLN
INTRAMUSCULAR | Status: DC | PRN
  Administered 2019-12-29: 1 mg via INTRAVENOUS

## 2019-12-29 MED ORDER — FENTANYL CITRATE (PF) 100 MCG/2ML IJ SOLN
INTRAMUSCULAR | Status: DC | PRN
  Administered 2019-12-29: 25 ug via INTRAVENOUS

## 2019-12-29 MED ORDER — VERAPAMIL HCL 2.5 MG/ML IV SOLN
INTRAVENOUS | Status: DC | PRN
  Administered 2019-12-29: 2.5 mg via INTRACORONARY

## 2019-12-29 MED ORDER — ASPIRIN EC 325 MG PO TBEC: 325.0000 mg | DELAYED_RELEASE_TABLET | Freq: Every day | ORAL | Status: DC

## 2019-12-29 MED ORDER — VERAPAMIL HCL 2.5 MG/ML IV SOLN
INTRAVENOUS | Status: AC
  Filled 2019-12-29: qty 2

## 2019-12-29 MED ORDER — ISOSORBIDE MONONITRATE ER 60 MG PO TB24
60.0000 mg | ORAL_TABLET | Freq: Every day | ORAL | Status: DC
  Administered 2019-12-29 – 2019-12-30 (×2): 60 mg via ORAL
  Filled 2019-12-29 (×2): qty 1

## 2019-12-29 MED ORDER — NITROGLYCERIN 2 % TD OINT
1.0000 [in_us] | TOPICAL_OINTMENT | Freq: Once | TRANSDERMAL | Status: AC
  Administered 2019-12-29: 02:00:00 1 [in_us] via TOPICAL
  Filled 2019-12-29: qty 1

## 2019-12-29 MED ORDER — SACUBITRIL-VALSARTAN 24-26 MG PO TABS
1.0000 | ORAL_TABLET | Freq: Two times a day (BID) | ORAL | Status: DC
  Administered 2019-12-29 – 2019-12-30 (×3): 1 via ORAL
  Filled 2019-12-29 (×3): qty 1

## 2019-12-29 MED ORDER — METOPROLOL TARTRATE 50 MG PO TABS
75.0000 mg | ORAL_TABLET | Freq: Two times a day (BID) | ORAL | Status: DC
  Administered 2019-12-29 – 2019-12-30 (×4): 75 mg via ORAL
  Filled 2019-12-29 (×4): qty 1

## 2019-12-29 MED ORDER — LABETALOL HCL 5 MG/ML IV SOLN: 10.0000 mg | INTRAVENOUS | Status: AC | PRN

## 2019-12-29 MED ORDER — LORATADINE 10 MG PO TABS
10.0000 mg | ORAL_TABLET | Freq: Every day | ORAL | Status: DC
  Administered 2019-12-29 – 2019-12-30 (×2): 10 mg via ORAL
  Filled 2019-12-29 (×2): qty 1

## 2019-12-29 MED ORDER — FENTANYL CITRATE (PF) 100 MCG/2ML IJ SOLN
INTRAMUSCULAR | Status: AC
  Filled 2019-12-29: qty 2

## 2019-12-29 MED ORDER — NITROGLYCERIN 0.4 MG SL SUBL: 0.4000 mg | SUBLINGUAL_TABLET | SUBLINGUAL | Status: DC | PRN

## 2019-12-29 MED ORDER — MORPHINE SULFATE (PF) 2 MG/ML IV SOLN: 2.0000 mg | INTRAVENOUS | Status: DC | PRN

## 2019-12-29 MED ORDER — FUROSEMIDE 40 MG PO TABS
40.0000 mg | ORAL_TABLET | Freq: Every day | ORAL | Status: DC
  Administered 2019-12-30: 09:00:00 40 mg via ORAL
  Filled 2019-12-29: qty 1

## 2019-12-29 MED ORDER — HEPARIN SODIUM (PORCINE) 5000 UNIT/ML IJ SOLN
5000.0000 [IU] | Freq: Three times a day (TID) | INTRAMUSCULAR | Status: DC
  Administered 2019-12-29 – 2019-12-30 (×2): 5000 [IU] via SUBCUTANEOUS
  Filled 2019-12-29 (×2): qty 1

## 2019-12-29 MED ORDER — INSULIN GLARGINE 100 UNIT/ML ~~LOC~~ SOLN
45.0000 [IU] | Freq: Every day | SUBCUTANEOUS | Status: DC
  Administered 2019-12-29: 21:00:00 45 [IU] via SUBCUTANEOUS
  Filled 2019-12-29 (×3): qty 0.45

## 2019-12-29 MED ORDER — COLCHICINE 0.6 MG PO TABS: 1.8000 mg | ORAL_TABLET | Freq: Every day | ORAL | Status: DC | PRN

## 2019-12-29 MED ORDER — DM-GUAIFENESIN ER 30-600 MG PO TB12
1.0000 | ORAL_TABLET | Freq: Two times a day (BID) | ORAL | Status: DC | PRN
  Filled 2019-12-29: qty 1

## 2019-12-29 MED ORDER — AMLODIPINE BESYLATE 5 MG PO TABS
5.0000 mg | ORAL_TABLET | Freq: Every day | ORAL | Status: DC
  Administered 2019-12-29 – 2019-12-30 (×2): 5 mg via ORAL
  Filled 2019-12-29 (×2): qty 1

## 2019-12-29 MED ORDER — AZITHROMYCIN 250 MG PO TABS
250.0000 mg | ORAL_TABLET | Freq: Every day | ORAL | Status: DC
  Administered 2019-12-29 – 2019-12-30 (×2): 250 mg via ORAL
  Filled 2019-12-29 (×2): qty 1

## 2019-12-29 MED ORDER — ALPRAZOLAM 0.25 MG PO TABS: 0.2500 mg | ORAL_TABLET | Freq: Two times a day (BID) | ORAL | Status: DC | PRN

## 2019-12-29 MED ORDER — ONDANSETRON HCL 4 MG/2ML IJ SOLN: 4.0000 mg | Freq: Four times a day (QID) | INTRAMUSCULAR | Status: DC | PRN

## 2019-12-29 MED ORDER — NITROGLYCERIN 1 MG/10 ML FOR IR/CATH LAB
INTRA_ARTERIAL | Status: DC | PRN
  Administered 2019-12-29: 200 ug via INTRACORONARY

## 2019-12-29 MED ORDER — SODIUM CHLORIDE 0.9 % WEIGHT BASED INFUSION: 1.0000 mL/kg/h | INTRAVENOUS | Status: DC

## 2019-12-29 MED ORDER — MONTELUKAST SODIUM 10 MG PO TABS
5.0000 mg | ORAL_TABLET | Freq: Every day | ORAL | Status: DC
  Administered 2019-12-29: 21:00:00 5 mg via ORAL
  Filled 2019-12-29: qty 1

## 2019-12-29 MED ORDER — METHOCARBAMOL 500 MG PO TABS
500.0000 mg | ORAL_TABLET | Freq: Two times a day (BID) | ORAL | Status: DC
  Administered 2019-12-29 – 2019-12-30 (×2): 500 mg via ORAL
  Filled 2019-12-29 (×5): qty 1

## 2019-12-29 MED ORDER — FERROUS SULFATE 325 (65 FE) MG PO TABS
325.0000 mg | ORAL_TABLET | Freq: Every day | ORAL | Status: DC
  Administered 2019-12-29 – 2019-12-30 (×2): 325 mg via ORAL
  Filled 2019-12-29 (×2): qty 1

## 2019-12-29 MED ORDER — CLOPIDOGREL BISULFATE 75 MG PO TABS
ORAL_TABLET | ORAL | Status: AC
  Filled 2019-12-29: qty 4

## 2019-12-29 MED ORDER — ASPIRIN 81 MG PO CHEW: 81.0000 mg | CHEWABLE_TABLET | ORAL | Status: DC

## 2019-12-29 MED ORDER — ASPIRIN 81 MG PO CHEW
CHEWABLE_TABLET | ORAL | Status: AC
  Filled 2019-12-29: qty 1

## 2019-12-29 MED ORDER — HYDRALAZINE HCL 20 MG/ML IJ SOLN: 10.0000 mg | INTRAMUSCULAR | Status: AC | PRN

## 2019-12-29 MED ORDER — FOLIC ACID 400 MCG PO TABS: 400.0000 ug | ORAL_TABLET | Freq: Every day | ORAL | 3 refills | Status: DC

## 2019-12-29 MED ORDER — HYDRALAZINE HCL 25 MG PO TABS
25.0000 mg | ORAL_TABLET | Freq: Three times a day (TID) | ORAL | Status: DC
  Administered 2019-12-29 – 2019-12-30 (×3): 25 mg via ORAL
  Filled 2019-12-29 (×3): qty 1

## 2019-12-29 MED ORDER — HEPARIN SODIUM (PORCINE) 1000 UNIT/ML IJ SOLN
INTRAMUSCULAR | Status: DC | PRN
  Administered 2019-12-29: 4500 [IU] via INTRAVENOUS
  Administered 2019-12-29: 5000 [IU] via INTRAVENOUS
  Administered 2019-12-29: 3000 [IU] via INTRAVENOUS

## 2019-12-29 MED ORDER — ZINC SULFATE 220 (50 ZN) MG PO CAPS
220.0000 mg | ORAL_CAPSULE | Freq: Every day | ORAL | Status: DC
  Administered 2019-12-29 – 2019-12-30 (×2): 220 mg via ORAL
  Filled 2019-12-29 (×2): qty 1

## 2019-12-29 MED ORDER — CITALOPRAM HYDROBROMIDE 20 MG PO TABS
10.0000 mg | ORAL_TABLET | Freq: Every day | ORAL | Status: DC
  Administered 2019-12-29 – 2019-12-30 (×2): 10 mg via ORAL
  Filled 2019-12-29 (×2): qty 1

## 2019-12-29 MED ORDER — HEPARIN BOLUS VIA INFUSION
4000.0000 [IU] | Freq: Once | INTRAVENOUS | Status: AC
  Administered 2019-12-29: 02:00:00 4000 [IU] via INTRAVENOUS
  Filled 2019-12-29: qty 4000

## 2019-12-29 MED ORDER — ACETAMINOPHEN 325 MG PO TABS: 650.0000 mg | ORAL_TABLET | ORAL | Status: DC | PRN

## 2019-12-29 MED ORDER — SODIUM CHLORIDE 0.9 % IV SOLN: INTRAVENOUS | Status: DC

## 2019-12-29 MED ORDER — HEPARIN (PORCINE) 25000 UT/250ML-% IV SOLN
1000.0000 [IU]/h | INTRAVENOUS | Status: DC
  Administered 2019-12-29: 1000 [IU]/h via INTRAVENOUS
  Filled 2019-12-29: qty 250

## 2019-12-29 MED ORDER — SODIUM CHLORIDE 0.9 % IV SOLN: INTRAVENOUS | Status: AC

## 2019-12-29 MED ORDER — CLOPIDOGREL BISULFATE 75 MG PO TABS
ORAL_TABLET | ORAL | Status: DC | PRN
  Administered 2019-12-29: 300 mg via ORAL

## 2019-12-29 MED ORDER — MAGNESIUM OXIDE 400 (241.3 MG) MG PO TABS
200.0000 mg | ORAL_TABLET | Freq: Every day | ORAL | Status: DC
  Administered 2019-12-29 – 2019-12-30 (×2): 200 mg via ORAL
  Filled 2019-12-29 (×2): qty 1

## 2019-12-29 MED ORDER — MIDAZOLAM HCL 2 MG/2ML IJ SOLN
INTRAMUSCULAR | Status: AC
  Filled 2019-12-29: qty 2

## 2019-12-29 MED ORDER — FUROSEMIDE 40 MG PO TABS
40.0000 mg | ORAL_TABLET | Freq: Every day | ORAL | Status: DC
  Administered 2019-12-29: 40 mg via ORAL
  Filled 2019-12-29: qty 1

## 2019-12-29 MED ORDER — IOHEXOL 300 MG/ML  SOLN
INTRAMUSCULAR | Status: DC | PRN
  Administered 2019-12-29: 90 mL

## 2019-12-29 MED ORDER — INSULIN ASPART 100 UNIT/ML ~~LOC~~ SOLN
0.0000 [IU] | SUBCUTANEOUS | Status: DC
  Administered 2019-12-29: 21:00:00 5 [IU] via SUBCUTANEOUS
  Administered 2019-12-29: 2 [IU] via SUBCUTANEOUS
  Administered 2019-12-30: 3 [IU] via SUBCUTANEOUS
  Filled 2019-12-29 (×3): qty 1

## 2019-12-29 MED ORDER — CLOPIDOGREL BISULFATE 75 MG PO TABS
75.0000 mg | ORAL_TABLET | Freq: Every day | ORAL | Status: DC
  Administered 2019-12-29 – 2019-12-30 (×2): 75 mg via ORAL
  Filled 2019-12-29 (×2): qty 1

## 2019-12-29 MED ORDER — PANTOPRAZOLE SODIUM 40 MG PO TBEC
40.0000 mg | DELAYED_RELEASE_TABLET | Freq: Two times a day (BID) | ORAL | Status: DC
  Administered 2019-12-29 – 2019-12-30 (×2): 40 mg via ORAL
  Filled 2019-12-29 (×2): qty 1

## 2019-12-29 MED ORDER — EPINEPHRINE 0.3 MG/0.3ML IJ SOAJ
0.3000 mg | INTRAMUSCULAR | Status: DC | PRN
  Filled 2019-12-29: qty 0.3

## 2019-12-29 MED ORDER — FLUTICASONE PROPIONATE 50 MCG/ACT NA SUSP
2.0000 | Freq: Two times a day (BID) | NASAL | Status: DC
  Administered 2019-12-29 – 2019-12-30 (×2): 2 via NASAL
  Filled 2019-12-29 (×2): qty 16

## 2019-12-29 MED ORDER — SODIUM CHLORIDE 0.9% FLUSH
3.0000 mL | Freq: Two times a day (BID) | INTRAVENOUS | Status: DC
  Administered 2019-12-30: 09:00:00 3 mL via INTRAVENOUS

## 2019-12-29 MED ORDER — ASCORBIC ACID 500 MG PO TABS
250.0000 mg | ORAL_TABLET | Freq: Every day | ORAL | Status: DC
  Administered 2019-12-29 – 2019-12-30 (×2): 250 mg via ORAL
  Filled 2019-12-29 (×2): qty 1

## 2019-12-29 MED ORDER — OMEGA-3-ACID ETHYL ESTERS 1 G PO CAPS
1.0000 g | ORAL_CAPSULE | Freq: Every day | ORAL | Status: DC
  Administered 2019-12-29 – 2019-12-30 (×2): 1 g via ORAL
  Filled 2019-12-29 (×2): qty 1

## 2019-12-29 MED ORDER — HEPARIN (PORCINE) IN NACL 1000-0.9 UT/500ML-% IV SOLN
INTRAVENOUS | Status: DC | PRN
  Administered 2019-12-29: 500 mL

## 2019-12-29 MED ORDER — IPRATROPIUM-ALBUTEROL 0.5-2.5 (3) MG/3ML IN SOLN: 3.0000 mL | RESPIRATORY_TRACT | Status: DC | PRN

## 2019-12-29 MED ORDER — HEPARIN (PORCINE) IN NACL 1000-0.9 UT/500ML-% IV SOLN
INTRAVENOUS | Status: AC
  Filled 2019-12-29: qty 1000

## 2019-12-29 SURGICAL SUPPLY — 18 items
BALLN TREK RX 2.25X12 (BALLOONS) ×2
BALLN ~~LOC~~ TREK RX 2.75X12 (BALLOONS) ×2
BALLOON TREK RX 2.25X12 (BALLOONS) IMPLANT
BALLOON ~~LOC~~ TREK RX 2.75X12 (BALLOONS) IMPLANT
CATH 5F 110X4 TIG (CATHETERS) ×1 IMPLANT
CATH VISTA GUIDE 6FR XBLAD3.5 (CATHETERS) ×1 IMPLANT
DEVICE INFLAT 30 PLUS (MISCELLANEOUS) ×1 IMPLANT
DEVICE RAD TR BAND REGULAR (VASCULAR PRODUCTS) ×1 IMPLANT
GLIDESHEATH SLEND SS 6F .021 (SHEATH) ×1 IMPLANT
KIT MANI 3VAL PERCEP (MISCELLANEOUS) ×2 IMPLANT
NDL PERC 18GX7CM (NEEDLE) IMPLANT
NEEDLE PERC 18GX7CM (NEEDLE) ×2 IMPLANT
PACK CARDIAC CATH (CUSTOM PROCEDURE TRAY) ×2 IMPLANT
SHEATH AVANTI 5FR X 11CM (SHEATH) ×1 IMPLANT
STENT RESOLUTE ONYX 2.5X15 (Permanent Stent) ×1 IMPLANT
WIRE GUIDERIGHT .035X150 (WIRE) ×1 IMPLANT
WIRE ROSEN-J .035X260CM (WIRE) ×1 IMPLANT
WIRE RUNTHROUGH .014X180CM (WIRE) ×1 IMPLANT

## 2019-12-29 NOTE — Progress Notes (Signed)
After multiple attempts at right femoarl access without success, femoral approach was aborted and asked Dr. Saunders Revel to perform cath via right radial approach. Pt informed. No immediate complications in right femoral site.

## 2019-12-29 NOTE — Progress Notes (Signed)
*  PRELIMINARY RESULTS* Echocardiogram 2D Echocardiogram has been performed.  Mike Decker 12/29/2019, 12:03 PM

## 2019-12-29 NOTE — H&P (Addendum)
[place] [facility] [place] NAME: Mike Decker    MR#:  [id number]  DATE OF BIRTH:  09-06-1941  DATE OF ADMISSION:  12/28/2019  PRIMARY CARE PHYSICIAN: Cletis Athens, MD   REQUESTING/REFERRING PHYSICIAN: Hinda Kehr, MD  CHIEF COMPLAINT:   Chief Complaint  Patient presents with  . Chest Pain    HISTORY OF PRESENT ILLNESS:  Mike Decker  is a 79 y.o. Caucasian male with a known history of multiple medical problems as mentioned below, who presented to emergency room with acute onset of midsternal chest pain felt as tightness and graded 4/10 in severity with radiation to the left arm as well as his jaw for which she took 3 sublingual nitroglycerin at home with mild improvement.  He admitted to nausea with his pain without vomiting or diaphoresis.  He denied any cough or wheezing or hemoptysis.  No leg pain or recent travels or surgeries.  He has been having mild intermittent cough over the last several weeks with occasional expectoration of yellow sputum.  No fever or chills.  He took doxycycline on 12/22 and apparently finished a week course that he was prescribed again last Monday and have only 4 doses left so far.  Upon presentation to the emergency room, blood pressure was 167/80 with otherwise normal vital signs.  Labs revealed glucose of 270 with BUN of 41 and creatinine 1.74 close to baseline with troponin I of 23 and later 373.  CBC showed anemia close to baseline.  Portable chest ray showed bilateral subpleural atelectasis with atypical infection is not excluded.  There was no focal consolidation.  EKG showed sinus rhythm with rate of 95 with P VCs and borderline prolonged QT interval of 484 QTC.  The patient was given IV heparin bolus and drip as well as an inch Nitropaste.  He was more comfortable during my interview.  He will be admitted to a telemetry bed for further evaluation and management.  PAST MEDICAL HISTORY:   Past Medical History:  Diagnosis  Date  . Anemia   . Arthritis   . Atrioventricular canal (AVC)    irregular heart beats  . Barrett esophagus   . 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 Chronic diastolic CHF (congestive heart failure) ([place])   . Colon polyp   . Diabetes mellitus without complication ([place])   . Diverticulosis   . Gout   . Heart disease   . Hemangioma    liver  . Hyperlipidemia   . Hypertension   . Myocardial infarct ([place])   . Ocular hypertension   . Peripheral vascular disease ([place])   . Skin cancer   . Sleep apnea   . Vitreoretinal degeneration     PAST SURGICAL HISTORY:   Past Surgical History:  Procedure Laterality Date  . CATARACT EXTRACTION  2011, 2012  . COLONOSCOPY N/A 12/14/2018   Procedure: COLONOSCOPY;  Surgeon: Virgel Manifold, MD;  Location: [facility] ENDOSCOPY;  Service: Endoscopy;  Laterality: N/A;  . CORONARY ANGIOPLASTY WITH STENT PLACEMENT  2012  . ESOPHAGOGASTRODUODENOSCOPY N/A 12/14/2018   Procedure: ESOPHAGOGASTRODUODENOSCOPY (EGD);  Surgeon: Virgel Manifold, MD;  Location: [facility] ENDOSCOPY;  Service: Endoscopy;  Laterality: N/A;  . ESOPHAGOGASTRODUODENOSCOPY (EGD) WITH PROPOFOL N/A 06/01/2016   Procedure: ESOPHAGOGASTRODUODENOSCOPY (EGD) WITH PROPOFOL;  Surgeon: Manya Silvas, MD;  Location: [facility] ENDOSCOPY;  Service: Endoscopy;  Laterality: N/A;  . HERNIA REPAIR     x2  . INTRAOCULAR LENS INSERTION    . LEFT HEART  CATH AND CORONARY ANGIOGRAPHY N/A 05/09/2017   Procedure: Left Heart Cath and Coronary Angiography;  Surgeon: Yolonda Kida, MD;  Location: [place] CV LAB;  Service: Cardiovascular;  Laterality: N/A;  . LEFT HEART CATH AND CORONARY ANGIOGRAPHY N/A 12/08/2018   Procedure: LEFT HEART CATH AND CORONARY ANGIOGRAPHY and possible PCI and stent;  Surgeon: Yolonda Kida, MD;  Location: [place] CV LAB;  Service: Cardiovascular;  Laterality: N/A;  . NOSE SURGERY     submucous resection  . PROSTATE SURGERY  2002  . ROTATOR CUFF REPAIR Right      SOCIAL HISTORY:   Social History   Tobacco Use  . Smoking status: Former Smoker    Years: 20.00    Types: Cigarettes    Quit date: 12/10/1976    Years since quitting: 43.0  . Smokeless tobacco: Never Used  Substance Use Topics  . Alcohol use: No    FAMILY HISTORY:   Family History  Problem Relation Age of Onset  . Arthritis Mother   . Stroke Maternal Grandfather   . Breast cancer Neg Hx     DRUG ALLERGIES:   Allergies  Allergen Reactions  . Gabapentin     Other reaction(s): Other (See Comments) Tremors  . Peanut-Containing Drug Products Anaphylaxis  . Penicillins Hives and Rash    Has patient had a PCN reaction causing immediate rash, facial/tongue/throat swelling, SOB or lightheadedness with hypotension: Yes Has patient had a PCN reaction causing severe rash involving mucus membranes or skin necrosis: No Has patient had a PCN reaction that required hospitalization No Has patient had a PCN reaction occurring within the last 10 years: No If all of the above answers are "NO", then may proceed with Cephalosporin use.  . Bee Venom Swelling  . Influenza Vaccines Hives  . Inh [Isoniazid] Hives  . Kenalog [Triamcinolone Acetonide] Hives  . Levaquin [Levofloxacin] Other (See Comments)    Tendon, ligament pain.   . Nalfon [Fenoprofen Calcium] Hives  . Naproxen   . Peanut Oil   . Nsaids Rash    Nalfon 600 Nalfon 600    REVIEW OF SYSTEMS:   ROS As per history of present illness. All pertinent systems were reviewed above. Constitutional,  HEENT, cardiovascular, respiratory, GI, GU, musculoskeletal, neuro, psychiatric, endocrine,  integumentary and hematologic systems were reviewed and are otherwise  negative/unremarkable except for positive findings mentioned above in the HPI.   MEDICATIONS AT HOME:   Prior to Admission medications   Medication Sig Start Date End Date Taking? Authorizing Provider  acetaminophen (TYLENOL) 500 MG tablet Take 1,000 mg by mouth  every 6 (six) hours as needed for mild pain, moderate pain or headache.   Yes [provider]  allopurinol (ZYLOPRIM) 100 MG tablet Take 100 mg by mouth daily.    Yes [provider]  amLODipine (NORVASC) 5 MG tablet Take 5 mg by mouth daily.  12/31/18  Yes [provider]  aspirin EC 81 MG EC tablet Take 1 tablet (81 mg total) by mouth daily. 12/18/19  Yes Amin, Jeanella Flattery, MD  budesonide-formoterol (SYMBICORT) 160-4.5 MCG/ACT inhaler Inhale 2 puffs into the lungs 2 (two) times daily as needed.    Yes [provider]  cetirizine (ZYRTEC) 5 MG tablet Take 5 mg by mouth daily as needed for allergies or rhinitis.    Yes [provider]  citalopram (CELEXA) 10 MG tablet Take 10 mg by mouth daily.   Yes [provider]  clopidogrel (PLAVIX) 75 MG tablet  Take 1 tablet (75 mg total) by mouth daily. 10/09/15  Yes Mody, Ulice Bold, MD  colchicine 0.6 MG tablet Take 1 tablet (0.6 mg total) by mouth daily. Patient taking differently: Take 1.8 mg by mouth daily as needed (gout flares).  11/17/18  Yes Mody, Ulice Bold, MD  dextromethorphan-guaiFENesin (MUCINEX DM) 30-600 MG 12hr tablet Take 1 tablet by mouth 2 (two) times daily as needed for cough.   Yes [provider]  EPINEPHrine (EPI-PEN) 0.3 mg/0.3 mL SOAJ injection Inject into the muscle as needed (anaphylaxis).    Yes [provider]  ferrous sulfate 325 (65 FE) MG EC tablet Take 1 tablet (325 mg total) by mouth daily. 11/10/19  Yes Earlie Server, MD  fluticasone [facility]) 50 MCG/ACT nasal spray Place 2 sprays into both nostrils 2 (two) times daily.  11/04/18  Yes [provider]  folic acid (V-R FOLIC ACID) 892 MCG tablet Take 1 tablet (400 mcg total) by mouth daily. 12/30/18  Yes Earlie Server, MD  furosemide (LASIX) 20 MG tablet Take 2 tablets (40 mg total) by mouth daily. 11/17/18  Yes Bettey Costa, MD  hydrALAZINE (APRESOLINE) 25 MG tablet Take 1 tablet (25 mg total) by mouth every 8 (eight)  hours. 12/17/19  Yes Amin, Ankit Chirag, MD  insulin glargine (LANTUS) 100 UNIT/ML injection Inject 0.45 mLs (45 Units total) into the skin at bedtime. 12/17/19  Yes Amin, Ankit Chirag, MD  ipratropium-albuterol (DUONEB) 0.5-2.5 (3) MG/3ML SOLN Take 3 mLs by nebulization every 4 (four) hours as needed. 05/15/17  Yes Hillary Bow, MD  isosorbide mononitrate (IMDUR) 60 MG 24 hr tablet Take 60 mg by mouth daily.  01/28/19  Yes [provider]  Magnesium 250 MG TABS Take 250 mg by mouth daily.   Yes [provider]  methocarbamol (ROBAXIN) 500 MG tablet Take 500 mg by mouth 2 (two) times daily.  11/08/18  Yes [provider]  metoprolol tartrate (LOPRESSOR) 50 MG tablet Take 75 mg by mouth 2 (two) times daily.    Yes [provider]  montelukast (SINGULAIR) 10 MG tablet TAKE 1 TABLET BY MOUTH EVERYDAY AT BEDTIME 12/14/19  Yes Scar[place], Audie Clear, NP  Omega-3 Fatty Acids (FISH OIL) 1000 MG CAPS Take 1 capsule by mouth daily.    Yes [provider]  pantoprazole (PROTONIX) 40 MG tablet Take 1 tablet (40 mg total) by mouth 2 (two) times daily before a meal. 12/17/19  Yes Amin, Ankit Chirag, MD  rosuvastatin (CRESTOR) 40 MG tablet Take 40 mg by mouth every evening.    Yes [provider]  sacubitril-valsartan (ENTRESTO) 24-26 MG Take 1 tablet by mouth 2 (two) times daily. 12/17/19  Yes Amin, Jeanella Flattery, MD  vitamin C (ASCORBIC ACID) 250 MG tablet Take 250 mg by mouth daily.   Yes [provider]  Vitamin D, Ergocalciferol, (DRISDOL) 50000 units CAPS capsule Take 50,000 Units by mouth every 30 (thirty) days.    Yes [provider]  zinc gluconate 50 MG tablet Take 50 mg by mouth daily.   Yes [provider]  nitroGLYCERIN (NITROSTAT) 0.4 MG SL tablet Place 0.4 mg under the tongue every 5 (five) minutes x 3 doses as needed for chest pain.     [provider]      VITAL SIGNS:  Blood pressure (!) 165/66, pulse 85, temperature 97.9  F (36.6 C), temperature source Oral, resp. rate 17, height 5\' 6"  (1.676 m), weight 89.3 kg, SpO2 97 %.  PHYSICAL EXAMINATION:  Physical Exam  GENERAL:  79 y.o.-year-old Caucasian male patient lying in the bed with no acute distress.  EYES: Pupils equal, round, reactive to light and accommodation. No scleral icterus. Extraocular muscles intact.  HEENT: Head atraumatic, normocephalic. Oropharynx and nasopharynx clear.  NECK:  Supple, no jugular venous distention. No thyroid enlargement, no tenderness.  LUNGS: Normal breath sounds bilaterally, no wheezing, rales,rhonchi or crepitation. No use of accessory muscles of respiration.  CARDIOVASCULAR: Regular rate and rhythm, S1, S2 normal. No murmurs, rubs, or gallops.  ABDOMEN: Soft, nondistended, nontender. Bowel sounds present. No organomegaly or mass.  EXTREMITIES: No pedal edema, cyanosis, or clubbing.  NEUROLOGIC: Cranial nerves II through XII are intact. Muscle strength 5/5 in all extremities. Sensation intact. Gait not checked.  PSYCHIATRIC: The patient is alert and oriented x 3.  Normal affect and good eye contact. SKIN: No obvious rash, lesion, or ulcer.   LABORATORY PANEL:   CBC Recent Labs  Lab 12/28/19 2217  WBC 8.9  HGB 11.7*  HCT 36.3*  PLT 292   ------------------------------------------------------------------------------------------------------------------  Chemistries  Recent Labs  Lab 12/28/19 1522 12/28/19 1522 12/28/19 2217  NA 137   < > 138  K 5.2*   < > 4.5  CL 105   < > 107  CO2 22   < > 21*  GLUCOSE 249*   < > 270*  BUN 42*   < > 41*  CREATININE 1.98*   < > 1.74*  CALCIUM 9.2   < > 9.1  AST 14*  --   --   ALT 15  --   --   ALKPHOS 86  --   --   BILITOT 0.6  --   --    < > = values in this interval not displayed.   ------------------------------------------------------------------------------------------------------------------  Cardiac Enzymes No results for input(s): TROPONINI in the last 168  hours. ------------------------------------------------------------------------------------------------------------------  RADIOLOGY:  DG Chest 2 View  Result Date: 12/28/2019 CLINICAL DATA:  Chest pain. EXAM: CHEST - 2 VIEW COMPARISON:  Radiograph 12/21/2019, CT 12/11/2018 FINDINGS: The cardiomediastinal contours are normal. Bilateral subpleural opacities have improved from prior exam. Pulmonary vasculature is normal. No consolidation, pleural effusion, or pneumothorax. No acute osseous abnormalities are seen. IMPRESSION: No acute chest findings. Improving bilateral subpleural opacities. Electronically Signed   By: Keith Rake M.D.   On: 12/28/2019 22:45      IMPRESSION AND PLAN:   1.  Non-STEMI with history of coronary artery disease status post PCI and stents.  The patient will be admitted to a telemetry bed.  We will follow serial troponin I's.  The patient will be continued on IV heparin, aspirin, Plavix as well as continue beta-blocker, statin therapy and as needed sublingual nitroglycerin and IV morphine sulfate for pain.  Will obtain a 2D echo and a cardiology consultation by [facility] clinic.  I notified Dr. Ubaldo Glassing about the patient.  2.  Hypertension.  We will continue Norvasc and hydralazine.  3.  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  The patient will be placed on supplement coverage with NovoLog.  4.  Dyslipidemia.  Statin therapy will be resumed.  5.  Gout.  We will continue allopurinol.  6.  Anxiety and depression.  We will continue Celexa and Xanax.  7.  Chronic diastolic CHF.  We will continue Entresto and Lasix.  8.  DVT prophylaxis.  Subcutaneous Lovenox   All the records are reviewed and case discussed with ED provider. The plan of care was discussed in details with  the patient (and family). I answered all questions. The patient agreed to proceed with the above mentioned plan. Further management will depend upon hospital  course.   CODE STATUS: Full code  TOTAL TIME TAKING CARE OF THIS PATIENT: 55 minutes.    Christel Mormon M.D on 12/29/2019 at 3:15 AM  [facility]   From 7 PM-7 AM, contact night-coverage [email or web address]  CC: Primary care physician; Cletis Athens, MD   Note: This dictation was prepared with Dragon dictation along with smaller phrase technology. Any transcriptional errors that result from this process are unintentional.

## 2019-12-29 NOTE — Consult Note (Signed)
CARDIOLOGY CONSULT NOTE               Patient ID: Mike Decker MRN: [id number] DOB/AGE: 01/14/1941 79 y.o.  Admit date: 12/28/2019 Referring Physician Dr. Eugenie Norrie  Primary Physician Dr. Cletis Athens  Primary Cardiologist Dr. Clayborn Bigness  Reason for Consultation Chest pain, elevated troponin   HPI: Mike Decker is a 79 year old male with a past medical history significant for coronary artery disease s/p PCI to RCA, history of a CVA, chronic kidney disease, peripheral vascular disease, type 2 diabetes, COPD, hypertension, hyperlipidemia, and GERD who presented to the ED on 12/28/19 for an acute onset of mid-sternal chest pain with associated shortness of breath, radiating to his left arm and jaw.  While at home, he took 3 sublingual nitroglycerin tablets which did not relieve his symptoms. Workup in the ED included high sensitivity troponin elevated x 3 at 23, 373, and 2426 respectively, chest xray negative for acute cardiopulmonary disease, ECG revealing sinus rhythm with PVCs, and initial creatinine of 1.74.  Of note, he was admitted on 12/14/19 for chest pain with an elevated troponin, and medical management was recommended.    On exam, Mike Decker continues to admit to 4/5 mid-sternal chest discomfort.  He denies current shortness of breath, lower extremity swelling, or orthopnea.  He also denies dizziness, lightheadedness, or syncopal/presyncopal episodes.   He is followed by Dr. Clayborn Bigness at [facility].  Most recent echocardiogram on 12/14/19 revealed low normal LV systolic function with an EF estimated between 50-55% with mild MR, mildly elevated pulmonary pressures.  Cardiac catheterization on 11/28/18 revealed a proximal LAD to mid LAD lesion, 50% stenosed, a distal LAD lesion, 75% stenosed, an ostial circumflex to mid circumflex lesion, 75% stenosed, a distal RCA lesion, 40% stenosed, a 1st marginal lesion, 50% stenosed, a proximal to mid RCA lesion, 40% stenosed, and a patent proximal  RCA stent.   Review of systems complete and found to be negative unless listed above     Past Medical History:  Diagnosis Date  . Anemia   . Arthritis   . Atrioventricular canal (AVC)    irregular heart beats  . Barrett esophagus   . 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 Chronic diastolic CHF (congestive heart failure) ([place])   . Colon polyp   . Diabetes mellitus without complication ([place])   . Diverticulosis   . Gout   . Heart disease   . Hemangioma    liver  . Hyperlipidemia   . Hypertension   . Myocardial infarct ([place])   . Ocular hypertension   . Peripheral vascular disease ([place])   . Skin cancer   . Sleep apnea   . Vitreoretinal degeneration     Past Surgical History:  Procedure Laterality Date  . CATARACT EXTRACTION  2011, 2012  . COLONOSCOPY N/A 12/14/2018   Procedure: COLONOSCOPY;  Surgeon: Virgel Manifold, MD;  Location: [facility] ENDOSCOPY;  Service: Endoscopy;  Laterality: N/A;  . CORONARY ANGIOPLASTY WITH STENT PLACEMENT  2012  . ESOPHAGOGASTRODUODENOSCOPY N/A 12/14/2018   Procedure: ESOPHAGOGASTRODUODENOSCOPY (EGD);  Surgeon: Virgel Manifold, MD;  Location: [facility] ENDOSCOPY;  Service: Endoscopy;  Laterality: N/A;  . ESOPHAGOGASTRODUODENOSCOPY (EGD) WITH PROPOFOL N/A 06/01/2016   Procedure: ESOPHAGOGASTRODUODENOSCOPY (EGD) WITH PROPOFOL;  Surgeon: Manya Silvas, MD;  Location: [facility] ENDOSCOPY;  Service: Endoscopy;  Laterality: N/A;  . HERNIA REPAIR     x2  . INTRAOCULAR LENS INSERTION    . LEFT HEART CATH AND CORONARY ANGIOGRAPHY N/A  05/09/2017   Procedure: Left Heart Cath and Coronary Angiography;  Surgeon: Yolonda Kida, MD;  Location: [place] CV LAB;  Service: Cardiovascular;  Laterality: N/A;  . LEFT HEART CATH AND CORONARY ANGIOGRAPHY N/A 12/08/2018   Procedure: LEFT HEART CATH AND CORONARY ANGIOGRAPHY and possible PCI and stent;  Surgeon: Yolonda Kida, MD;  Location: [place] CV LAB;  Service: Cardiovascular;  Laterality: N/A;   . NOSE SURGERY     submucous resection  . PROSTATE SURGERY  2002  . ROTATOR CUFF REPAIR Right     Medications Prior to Admission  Medication Sig Dispense Refill Last Dose  . acetaminophen (TYLENOL) 500 MG tablet Take 1,000 mg by mouth every 6 (six) hours as needed for mild pain, moderate pain or headache.   prn at prn  . allopurinol (ZYLOPRIM) 100 MG tablet Take 100 mg by mouth daily.    12/28/2019 at 1000  . amLODipine (NORVASC) 5 MG tablet Take 5 mg by mouth daily.    12/28/2019 at 1000  . aspirin EC 81 MG EC tablet Take 1 tablet (81 mg total) by mouth daily. 60 tablet 0 12/28/2019 at 1000  . budesonide-formoterol (SYMBICORT) 160-4.5 MCG/ACT inhaler Inhale 2 puffs into the lungs 2 (two) times daily as needed.    12/28/2019 at Unknown time  . cetirizine (ZYRTEC) 5 MG tablet Take 5 mg by mouth daily as needed for allergies or rhinitis.    prn at prn  . citalopram (CELEXA) 10 MG tablet Take 10 mg by mouth daily.   12/28/2019 at 1000  . clopidogrel (PLAVIX) 75 MG tablet Take 1 tablet (75 mg total) by mouth daily. 30 tablet 0 12/28/2019 at 1000  . colchicine 0.6 MG tablet Take 1 tablet (0.6 mg total) by mouth daily. (Patient taking differently: Take 1.8 mg by mouth daily as needed (gout flares). ) 30 tablet 0 prn at prn  . dextromethorphan-guaiFENesin (MUCINEX DM) 30-600 MG 12hr tablet Take 1 tablet by mouth 2 (two) times daily as needed for cough.   12/28/2019 at 1000  . EPINEPHrine (EPI-PEN) 0.3 mg/0.3 mL SOAJ injection Inject into the muscle as needed (anaphylaxis).    prn at prn  . ferrous sulfate 325 (65 FE) MG EC tablet Take 1 tablet (325 mg total) by mouth daily. 90 tablet 3 12/28/2019 at 1000  . fluticasone (FLONASE) 50 MCG/ACT nasal spray Place 2 sprays into both nostrils 2 (two) times daily.   3 12/28/2019 at 1000  . folic acid (V-R FOLIC ACID) 417 MCG tablet Take 1 tablet (400 mcg total) by mouth daily. 90 tablet 3 12/28/2019 at 1000  . furosemide (LASIX) 20 MG tablet Take 2 tablets (40 mg total)  by mouth daily.   12/28/2019 at 1000  . hydrALAZINE (APRESOLINE) 25 MG tablet Take 1 tablet (25 mg total) by mouth every 8 (eight) hours. 60 tablet 0 12/28/2019 at 1500  . insulin glargine (LANTUS) 100 UNIT/ML injection Inject 0.45 mLs (45 Units total) into the skin at bedtime. 10 mL 11 12/27/2019 at 2000  . ipratropium-albuterol (DUONEB) 0.5-2.5 (3) MG/3ML SOLN Take 3 mLs by nebulization every 4 (four) hours as needed. 360 mL 0 12/28/2019 at Unknown time  . isosorbide mononitrate (IMDUR) 60 MG 24 hr tablet Take 60 mg by mouth daily.    12/28/2019 at 1000  . Magnesium 250 MG TABS Take 250 mg by mouth daily.   12/28/2019 at 1000  . methocarbamol (ROBAXIN) 500 MG tablet Take 500 mg by mouth 2 (two) times  daily.   0 12/28/2019 at 1000  . metoprolol tartrate (LOPRESSOR) 50 MG tablet Take 75 mg by mouth 2 (two) times daily.    12/28/2019 at 1000  . montelukast (SINGULAIR) 10 MG tablet TAKE 1 TABLET BY MOUTH EVERYDAY AT BEDTIME 30 tablet 3 12/27/2019 at 2000  . Omega-3 Fatty Acids (FISH OIL) 1000 MG CAPS Take 1 capsule by mouth daily.    12/28/2019 at 1000  . pantoprazole (PROTONIX) 40 MG tablet Take 1 tablet (40 mg total) by mouth 2 (two) times daily before a meal.   12/28/2019 at 1800  . rosuvastatin (CRESTOR) 40 MG tablet Take 40 mg by mouth every evening.    12/27/2019 at 2000  . sacubitril-valsartan (ENTRESTO) 24-26 MG Take 1 tablet by mouth 2 (two) times daily. 60 tablet 0 12/28/2019 at 1000  . vitamin C (ASCORBIC ACID) 250 MG tablet Take 250 mg by mouth daily.   12/28/2019 at 1000  . Vitamin D, Ergocalciferol, (DRISDOL) 50000 units CAPS capsule Take 50,000 Units by mouth every 30 (thirty) days.   1 Past Month at Unknown time  . zinc gluconate 50 MG tablet Take 50 mg by mouth daily.   12/28/2019 at 1000  . nitroGLYCERIN (NITROSTAT) 0.4 MG SL tablet Place 0.4 mg under the tongue every 5 (five) minutes x 3 doses as needed for chest pain.    prn at prn   Social History   Socioeconomic History  . Marital status:  Married    Spouse name: Not on file  . Number of children: Not on file  . Years of education: Not on file  . Highest education level: Not on file  Occupational History  . Not on file  Tobacco Use  . Smoking status: Former Smoker    Years: 20.00    Types: Cigarettes    Quit date: 12/10/1976    Years since quitting: 43.0  . Smokeless tobacco: Never Used  Substance and Sexual Activity  . Alcohol use: No  . Drug use: No  . Sexual activity: Not on file  Other Topics Concern  . Not on file  Social History Narrative  . Not on file   Social Determinants of Health   Financial Resource Strain:   . Difficulty of Paying Living Expenses: Not on file  Food Insecurity:   . Worried About Charity fundraiser in the Last Year: Not on file  . Ran Out of Food in the Last Year: Not on file  Transportation Needs:   . Lack of Transportation (Medical): Not on file  . Lack of Transportation (Non-Medical): Not on file  Physical Activity:   . Days of Exercise per Week: Not on file  . Minutes of Exercise per Session: Not on file  Stress:   . Feeling of Stress : Not on file  Social Connections:   . Frequency of Communication with Friends and Family: Not on file  . Frequency of Social Gatherings with Friends and Family: Not on file  . Attends Religious Services: Not on file  . Active Member of Clubs or Organizations: Not on file  . Attends Archivist Meetings: Not on file  . Marital Status: Not on file  Intimate Partner Violence:   . Fear of Current or Ex-Partner: Not on file  . Emotionally Abused: Not on file  . Physically Abused: Not on file  . Sexually Abused: Not on file    Family History  Problem Relation Age of Onset  . Arthritis Mother   .  Stroke Maternal Grandfather   . Breast cancer Neg Hx       Review of systems complete and found to be negative unless listed above      PHYSICAL EXAM  General: Well developed, well nourished, in no acute distress HEENT:   Normocephalic and atramatic Neck:  No JVD.  Lungs: Clear bilaterally to auscultation and percussion. Heart: HRRR . Normal S1 and S2 without gallops or murmurs.  Abdomen: Bowel sounds are positive, abdomen soft and non-tender  Msk:  Back normal. Normal strength and tone for age. Extremities: No clubbing, cyanosis or edema.   Neuro: Alert and oriented X 3. Psych:  Good affect, responds appropriately  Labs:   Lab Results  Component Value Date   WBC 10.9 (H) 12/29/2019   HGB 11.1 (L) 12/29/2019   HCT 34.7 (L) 12/29/2019   MCV 93.3 12/29/2019   PLT 270 12/29/2019    Recent Labs  Lab 12/28/19 1522 12/28/19 2217 12/29/19 0440  NA 137   < > 140  K 5.2*   < > 4.5  CL 105   < > 110  CO2 22   < > 19*  BUN 42*   < > 42*  CREATININE 1.98*   < > 1.67*  CALCIUM 9.2   < > 8.9  PROT 7.3  --   --   BILITOT 0.6  --   --   ALKPHOS 86  --   --   ALT 15  --   --   AST 14*  --   --   GLUCOSE 249*   < > 248*   < > = values in this interval not displayed.   Lab Results  Component Value Date   TROPONINI 0.03 (HH) 12/12/2018    Lab Results  Component Value Date   CHOL 115 12/29/2019   CHOL 107 12/15/2019   CHOL 112 11/15/2018   Lab Results  Component Value Date   HDL 31 (L) 12/29/2019   HDL 22 (L) 12/15/2019   HDL 27 (L) 11/15/2018   Lab Results  Component Value Date   LDLCALC 51 12/29/2019   LDLCALC 33 12/15/2019   LDLCALC 51 11/15/2018   Lab Results  Component Value Date   TRIG 163 (H) 12/29/2019   TRIG 260 (H) 12/15/2019   TRIG 171 (H) 11/15/2018   Lab Results  Component Value Date   CHOLHDL 3.7 12/29/2019   CHOLHDL 4.9 12/15/2019   CHOLHDL 4.1 11/15/2018   No results found for: LDLDIRECT    Radiology: CT ABDOMEN PELVIS WO CONTRAST  Result Date: 12/16/2019 CLINICAL DATA:  Diverticulitis suspected. Left upper quadrant and around movement EXAM: CT ABDOMEN AND PELVIS WITHOUT CONTRAST TECHNIQUE: Multidetector CT imaging of the abdomen and pelvis was performed  following the standard protocol without IV contrast. COMPARISON:  05/07/2017 FINDINGS: Lower chest:  No contributory findings. Hepatobiliary: Subtle low-density lesion on the upper right liver, hemangioma based on previous imaging/reporting.No evidence of biliary obstruction or stone. Pancreas: Unremarkable. Spleen: Unremarkable. Adrenals/Urinary Tract: Negative adrenals. No hydronephrosis or stone. Unremarkable bladder. Stomach/Bowel: No obstruction. No appendicitis. Sigmoid diverticulosis. No diverticulitis noted. Vascular/Lymphatic: No acute vascular abnormality. 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 No mass or adenopathy. Reproductive:Prostatectomy without worrisome nodularity. Other: No ascites or pneumoperitoneum. Fatty right inguinal hernia. Left inguinal hernia repair. Musculoskeletal: No acute abnormalities. IMPRESSION: 1. No acute finding including evidence of diverticulitis 2. Fatty right inguinal hernia. 3. Left colonic diverticulosis. Electronically Signed   By: Angelica Chessman  Watts M.D.   On: 12/16/2019 08:00   DG Chest 2 View  Result Date: 12/28/2019 CLINICAL DATA:  Chest pain. EXAM: CHEST - 2 VIEW COMPARISON:  Radiograph 12/21/2019, CT 12/11/2018 FINDINGS: The cardiomediastinal contours are normal. Bilateral subpleural opacities have improved from prior exam. Pulmonary vasculature is normal. No consolidation, pleural effusion, or pneumothorax. No acute osseous abnormalities are seen. IMPRESSION: No acute chest findings. Improving bilateral subpleural opacities. Electronically Signed   By: Keith Rake M.D.   On: 12/28/2019 22:45   DG Chest 2 View  Result Date: 12/21/2019 CLINICAL DATA:  79 year old male with shortness of breath and left-sided chest pain. EXAM: CHEST - 2 VIEW COMPARISON:  Chest radiograph dated 12/16/2019. FINDINGS: Minimal bilateral subpleural hazy densities may represent atelectasis. Atypical infection is not excluded.  Clinical correlation is recommended. No lobar consolidation, pleural effusion, or pneumothorax. Stable cardiac silhouette. No acute osseous pathology. IMPRESSION: Bilateral subpleural atelectasis. Atypical infection is not excluded. Clinical correlation is recommended. No focal consolidation. Electronically Signed   By: Anner Crete M.D.   On: 12/21/2019 21:30   DG Chest 2 View  Result Date: 12/14/2019 CLINICAL DATA:  Chest pain EXAM: CHEST - 2 VIEW COMPARISON:  11/24/2019, 06/10/2019 FINDINGS: No consolidation or pleural effusion. Mild coarse bronchitic changes. Stable cardiomediastinal silhouette with borderline cardiomegaly. No pneumothorax. IMPRESSION: Low lung volumes with coarse bronchitic changes. No consolidative airspace disease. Borderline to mild cardiomegaly. Electronically Signed   By: Donavan Foil M.D.   On: 12/14/2019 03:18   US RENAL  Result Date: 12/16/2019 CLINICAL DATA:  Left upper quadrant pain.  History of renal stone. EXAM: RENAL / URINARY TRACT ULTRASOUND COMPLETE COMPARISON:  Abdominal CT from earlier the same day FINDINGS: Right Kidney: Renal measurements: 10.7 x 4.8 x 4.8 cm = volume: 130 mL. No hydronephrosis or mass. Two small cystic structures measuring 12 and 6 mm. Left Kidney: Renal measurements: 11.5 x 5 x 4.7 cm = volume: 141 mL. Echogenicity within normal limits. No mass or hydronephrosis visualized. Bladder: Appears normal for degree of bladder distention. IMPRESSION: Essentially negative renal ultrasound. No renal calculi were seen on preceding abdominal CT. Electronically Signed   By: Monte Fantasia M.D.   On: 12/16/2019 09:02   DG Chest Port 1 View  Result Date: 12/16/2019 CLINICAL DATA:  Dyspnea, short of breath with exertion, history of prostate esophageal cancer, CHF, diabetes mellitus, hypertension, MI EXAM: PORTABLE CHEST 1 VIEW COMPARISON:  Portable exam 1009 hours compared to 12/14/2019 FINDINGS: Borderline enlargement of cardiac silhouette. Mediastinal  contours and pulmonary vascularity normal. Lungs clear. No pulmonary infiltrate, pleural effusion or pneumothorax. Osseous structures unremarkable. IMPRESSION: No acute abnormalities. Electronically Signed   By: Lavonia Dana M.D.   On: 12/16/2019 10:28   ECHOCARDIOGRAM COMPLETE  Result Date: 12/15/2019   ECHOCARDIOGRAM REPORT   Patient Name:   FRANKE MENTER Date of Exam: 12/14/2019 Medical Rec #:  [id number]        Height:       66.0 in Accession #:    [id number]       Weight:       206.0 lb Date of Birth:  [birth year]        BSA:          2.03 m Patient Age:    14 years         BP:           130/57 mmHg Patient Gender: M  HR:           68 bpm. Exam Location:  [facility] Procedure: 2D Echo, Color Doppler and Cardiac Doppler Indications:     Chest pain 786.50  History:         Patient has prior history of Echocardiogram examinations, most                  recent 11/15/2018. Risk Factors:Sleep Apnea, Hypertension and                  Diabetes. Mi, PVD.  Sonographer:     Sherrie Sport RDCS (AE) Referring Phys:  [id number] Soledad Gerlach NIU Diagnosing Phys: Yolonda Kida MD  Sonographer Comments: Suboptimal parasternal window. IMPRESSIONS  1. Left ventricular ejection fraction, by visual estimation, is 50 to 55%. The left ventricle has normal function. Normal left ventricular posterior wall thickness. There is no left ventricular hypertrophy.  2. The left ventricle has no regional wall motion abnormalities.  3. Global right ventricle has normal systolic function.The right ventricular size is normal. No increase in right ventricular wall thickness.  4. Left atrial size was normal.  5. Right atrial size was normal.  6. The mitral valve is normal in structure. Mild mitral valve regurgitation.  7. The tricuspid valve is not well visualized.  8. The aortic valve is grossly normal. Aortic valve regurgitation is not visualized. Mild aortic valve sclerosis without stenosis.  9. The pulmonic valve was normal in structure. Pulmonic  valve regurgitation is not visualized. 10. Mildly elevated pulmonary artery systolic pressure. FINDINGS  Left Ventricle: Left ventricular ejection fraction, by visual estimation, is 50 to 55%. The left ventricle has normal function. The left ventricle has no regional wall motion abnormalities. Normal left ventricular posterior wall thickness. There is no left ventricular hypertrophy. Right Ventricle: The right ventricular size is normal. No increase in right ventricular wall thickness. Global RV systolic function is has normal systolic function. The tricuspid regurgitant velocity is 2.36 m/s, and with an assumed right atrial pressure  of 10 mmHg, the estimated right ventricular systolic pressure is mildly elevated at 32.3 mmHg. Left Atrium: Left atrial size was normal in size. Right Atrium: Right atrial size was normal in size Pericardium: There is no evidence of pericardial effusion. Mitral Valve: The mitral valve is normal in structure. Mild mitral valve regurgitation. Tricuspid Valve: The tricuspid valve is not well visualized. Tricuspid valve regurgitation is trivial. Aortic Valve: The aortic valve is grossly normal. Aortic valve regurgitation is not visualized. Mild aortic valve sclerosis is present, with no evidence of aortic valve stenosis. Aortic valve mean gradient measures 11.3 mmHg. Aortic valve peak gradient measures 20.8 mmHg. Aortic valve area, by VTI measures 1.19 cm. Pulmonic Valve: The pulmonic valve was normal in structure. Pulmonic valve regurgitation is not visualized. Pulmonic regurgitation is not visualized. Aorta: The aortic root is normal in size and structure. IAS/Shunts: No atrial level shunt detected by color flow Doppler.  LEFT VENTRICLE PLAX 2D LVIDd:         4.05 cm LVIDs:         2.98 cm LV PW:         1.25 cm LV IVS:        1.80 cm LVOT diam:     2.00 cm LV SV:         38 ml LV SV Index:   17.76 LVOT Area:     3.14 cm  RIGHT VENTRICLE RV Basal diam:  3.73 cm RV S prime:  12.40  cm/s TAPSE (M-mode): 3.4 cm LEFT ATRIUM             Index       RIGHT ATRIUM           Index LA diam:        4.00 cm 1.98 cm/m  RA Area:     18.30 cm LA Vol (A2C):   56.6 ml 27.95 ml/m RA Volume:   52.10 ml  25.73 ml/m LA Vol (A4C):   68.0 ml 33.58 ml/m LA Biplane Vol: 65.6 ml 32.39 ml/m  AORTIC VALVE                    PULMONIC VALVE AV Area (Vmax):    1.12 cm     PV Vmax:        0.98 m/s AV Area (Vmean):   1.18 cm     PV Peak grad:   3.8 mmHg AV Area (VTI):     1.19 cm     RVOT Peak grad: 6 mmHg AV Vmax:           228.00 cm/s AV Vmean:          152.000 cm/s AV VTI:            0.448 m AV Peak Grad:      20.8 mmHg AV Mean Grad:      11.3 mmHg LVOT Vmax:         81.30 cm/s LVOT Vmean:        57.000 cm/s LVOT VTI:          0.170 m LVOT/AV VTI ratio: 0.38  AORTA Ao Root diam: 2.90 cm MITRAL VALVE                       TRICUSPID VALVE MV Area (PHT): 1.12 cm            TR Peak grad:   22.3 mmHg MV PHT:        196.62 msec         TR Vmax:        236.00 cm/s MV Decel Time: 678 msec MV E velocity: 96.25 cm/s 103 cm/s SHUNTS                                    Systemic VTI:  0.17 m                                    Systemic Diam: 2.00 cm  Dwayne Prince Rome MD Electronically signed by Yolonda Kida MD Signature Date/Time: 12/15/2019/11:31:19 AM    Final     EKG: Sinus rhythm, PVCs, rate of 95bpm   ASSESSMENT AND PLAN:  1.  Chest pain, elevated troponin   -With history of CAD, ongoing chest pain, and elevated troponin, will proceed with left cardiac catheterization for further evaluation/treatment; benefits as well as risks of the procedure including bleeding, infection, MI, stroke, and death were discussed with the patient who wishes to proceed   2.  History of atrial fibrillation   -Currently not on anticoagulation; on aspirin and Plavix   -Rate is well controlled; continue metoprolol 75mg  BID   3.  Chronic HFrEF   -Appears euvolemic on exam with no evidence of vascular congestion on chest xray    -  Continue home medication regimen of Entresto, metoprolol, Lasix    The history, physical exam findings, and plan of care were all discussed with Dr. Bartholome Bill, and all decision making was made in collaboration   Signed: Avie Arenas PA-C 12/29/2019, 7:32 AM

## 2019-12-29 NOTE — Progress Notes (Addendum)
PROGRESS NOTE    Mike Decker  XVQ:[id number] DOB: September 12, 1941 DOA: 12/28/2019 PCP: Cletis Athens, MD       Assessment & Plan:   Active Problems:   NSTEMI (non-ST elevated myocardial infarction) ([place])   NSTEMI: with history of coronary artery disease s/p stents.  Troponins are elevated. Continue on IV heparin, aspirin, plavix, metoprolol, & statin. Continue on tele. Cardiac cath today. 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 Will continue to monitor   Leukocytosis: likely reactive vs infection. Will continue to monitor  Pneumonia: b/l as CXR. Pt was previously taking doxycycline as outpatient as per his PCP but doxycycline known to cause esophagitis so will d/c. Will start azithromycin x 4 days more   Dyslipidemia: continue on statin   Gout: will continue allopurinol.  Depression: severity unknown. Will continue on home dose of citalopram  Anxiety: severity unknown. Will continue on xanax prn   Chronic diastolic CHF: will continue home dose of entresto and lasix  OSA: continue on CPAP qhs   DVT prophylaxis: IV  Heparin drip  Code Status: full  Family Communication:  Disposition Plan:    Consultants:   cardio   Procedures: cardiac cath today   Antimicrobials: azithromycin   Subjective: Pt c/o chest pain   Objective: Vitals:   12/29/19 0402 12/29/19 0437 12/29/19 0438 12/29/19 0518  BP: (!) 173/75 (!) 175/76  (!) 175/69  Pulse: 91 90 91 72  Resp: 17 (!) 24 17 20   Temp:    99 F (37.2 C)  TempSrc:    Oral  SpO2: 97% 97% 95% 98%  Weight:    89.5 kg  Height:    5\' 6"  (1.676 m)   No intake or output data in the 24 hours ending 12/29/19 0747 Filed Weights   12/28/19 2213 12/29/19 0518  Weight: 89.3 kg 89.5 kg    Examination:  General exam: Appears calm and comfortable  Respiratory system: diminished  breath sounds b/l  Cardiovascular system: S1 & S2 +. No gallops or clicks.  Gastrointestinal system: Abdomen is obese, soft and nontender.  Normal bowel sounds heard. Central nervous system: Alert and oriented. Moves all 4 extremities  Psychiatry: Judgement and insight appear normal. Mood & affect appropriate.     Data Reviewed: I have personally reviewed following labs and imaging studies  CBC: Recent Labs  Lab 12/28/19 1522 12/28/19 2217 12/29/19 0440  WBC 7.6 8.9 10.9*  NEUTROABS 5.3  --   --   HGB 11.7* 11.7* 11.1*  HCT 37.2* 36.3* 34.7*  MCV 94.2 93.8 93.3  PLT 279 292 093   Basic Metabolic Panel: Recent Labs  Lab 12/28/19 1522 12/28/19 2217 12/29/19 0440  NA 137 138 140  K 5.2* 4.5 4.5  CL 105 107 110  CO2 22 21* 19*  GLUCOSE 249* 270* 248*  BUN 42* 41* 42*  CREATININE 1.98* 1.74* 1.67*  CALCIUM 9.2 9.1 8.9   GFR: Estimated Creatinine Clearance: 37.6 mL/min (A) (by C-G formula based on SCr of 1.67 mg/dL (H)). Liver Function Tests: Recent Labs  Lab 12/28/19 1522  AST 14*  ALT 15  ALKPHOS 86  BILITOT 0.6  PROT 7.3  ALBUMIN 4.2   No results for input(s): LIPASE, AMYLASE in the last 168 hours. No results for input(s): AMMONIA in the last 168 hours. Coagulation Profile: Recent Labs  Lab 12/29/19 0209  INR 1.0  Cardiac Enzymes: No results for input(s): CKTOTAL, CKMB, CKMBINDEX, TROPONINI in the last 168 hours. BNP (last 3 results) No results for input(s): PROBNP in the last 8760 hours. HbA1C: No results for input(s): HGBA1C in the last 72 hours. CBG: No results for input(s): GLUCAP in the last 168 hours. Lipid Profile: Recent Labs    12/29/19 0440  CHOL 115  HDL 31*  LDLCALC 51  TRIG 163*  CHOLHDL 3.7   Thyroid Function Tests: No results for input(s): TSH, T4TOTAL, FREET4, T3FREE, THYROIDAB in the last 72 hours. Anemia Panel: Recent Labs    12/28/19 1522 12/29/19 0440  VITAMINB12 387  --   FERRITIN 73 68  TIBC 336  --   IRON 61   --    Sepsis Labs: No results for input(s): PROCALCITON, LATICACIDVEN in the last 168 hours.  Recent Results (from the past 240 hour(s))  Respiratory Panel by RT PCR (Flu A&B, Covid) - Nasopharyngeal Swab     Status: None   Collection Time: 12/29/19  1:34 AM   Specimen: Nasopharyngeal Swab  Result Value Ref Range Status   SARS Coronavirus 2 by RT PCR NEGATIVE NEGATIVE Final    Comment: (NOTE) SARS-CoV-2 target nucleic acids are NOT DETECTED. The SARS-CoV-2 RNA is generally detectable in upper respiratoy specimens during the acute phase of infection. The lowest concentration of SARS-CoV-2 viral copies this assay can detect is 131 copies/mL. A negative result does not preclude SARS-Cov-2 infection and should not be used as the sole basis for treatment or other patient management decisions. A negative result may occur with  improper specimen collection/handling, submission of specimen other than nasopharyngeal swab, presence of viral mutation(s) within the areas targeted by this assay, and inadequate number of viral copies (<131 copies/mL). A negative result must be combined with clinical observations, patient history, and epidemiological information. The expected result is Negative. Fact Sheet for Patients:  PinkCheek.be Fact Sheet for Healthcare Providers:  GravelBags.it This test is not yet ap proved or cleared by the [place] FDA and  has been authorized for detection and/or diagnosis of SARS-CoV-2 by FDA under an Emergency Use Authorization (EUA). This EUA will remain  in effect (meaning this test can be used) for the duration of the COVID-19 declaration under Section 564(b)(1) of the Act, 21 U.S.C. section 360bbb-3(b)(1), unless the authorization is terminated or revoked sooner.    Influenza A by PCR NEGATIVE NEGATIVE Final   Influenza B by PCR NEGATIVE NEGATIVE Final    Comment: (NOTE) The Xpert Xpress  SARS-CoV-2/FLU/RSV assay is intended as an aid in  the diagnosis of influenza from Nasopharyngeal swab specimens and  should not be used as a sole basis for treatment. Nasal washings and  aspirates are unacceptable for Xpert Xpress SARS-CoV-2/FLU/RSV  testing. Fact Sheet for Patients: PinkCheek.be Fact Sheet for Healthcare Providers: GravelBags.it This test is not yet approved or cleared by the [place] FDA and  has been authorized for detection and/or diagnosis of SARS-CoV-2 by  FDA under an Emergency Use Authorization (EUA). This EUA will remain  in effect (meaning this test can be used) for the duration of the  Covid-19 declaration under Section 564(b)(1) of the Act, 21  U.S.C. section 360bbb-3(b)(1), unless the authorization is  terminated or revoked. Performed at [facility], [address] [place], [place] [address]          Radiology Studies: DG Chest 2 View  Result Date: 12/28/2019 CLINICAL DATA:  Chest pain. EXAM: CHEST - 2 VIEW COMPARISON:  Radiograph 12/21/2019, CT 12/11/2018 FINDINGS: The cardiomediastinal contours are normal. Bilateral subpleural opacities have improved from prior exam. Pulmonary vasculature is normal. No consolidation, pleural effusion, or pneumothorax. No acute osseous abnormalities are seen. IMPRESSION: No acute chest findings. Improving bilateral subpleural opacities. Electronically Signed   By: Keith Rake M.D.   On: 12/28/2019 22:45        Scheduled Meds: . amLODipine  5 mg Oral Daily  . vitamin C  250 mg Oral Daily  . [START ON 12/30/2019] aspirin EC  325 mg Oral Daily  . citalopram  10 mg Oral Daily  . clopidogrel  75 mg Oral Daily  . ferrous sulfate  325 mg Oral Daily  . fluticasone  2 spray Each Nare BID  . folic acid  466 mcg Oral Daily  . furosemide  40 mg Oral Daily  . hydrALAZINE  25 mg Oral Q8H  . insulin aspart  0-9 Units Subcutaneous Q4H  .  insulin glargine  45 Units Subcutaneous QHS  . isosorbide mononitrate  60 mg Oral Daily  . loratadine  10 mg Oral Daily  . magnesium oxide  200 mg Oral Daily  . methocarbamol  500 mg Oral BID  . metoprolol tartrate  75 mg Oral BID  . montelukast  5 mg Oral QHS  . omega-3 acid ethyl esters  1 g Oral Daily  . pantoprazole  40 mg Oral BID AC  . rosuvastatin  40 mg Oral QPM  . sacubitril-valsartan  1 tablet Oral BID  . sodium chloride flush  3 mL Intravenous Q12H  . [START ON 01/02/2020] Vitamin D (Ergocalciferol)  50,000 Units Oral Q30 days  . zinc sulfate  220 mg Oral Daily   Continuous Infusions: . sodium chloride 100 mL/hr at 12/29/19 0443  . heparin 1,000 Units/hr (12/29/19 0213)     LOS: 0 days    Time spent: 32 mins    Wyvonnia Dusky, MD [facility] Pager [phone]  If 7PM-7AM, please contact night-coverage [email or web address] Password [facility] 12/29/2019, 7:47 AM

## 2019-12-29 NOTE — Plan of Care (Signed)
Patient s/p heart cath with PCI, right radial at level 0. Pulses palpable, no c/o pain.   Problem: Education: Goal: Understanding of CV disease, CV risk reduction, and recovery process will improve Outcome: Progressing   Problem: Cardiovascular: Goal: Ability to achieve and maintain adequate cardiovascular perfusion will improve Outcome: Progressing Goal: Vascular access site(s) Level 0-1 will be maintained Outcome: Progressing   Problem: Health Behavior/Discharge Planning: Goal: Ability to safely manage health-related needs after discharge will improve Outcome: Progressing

## 2019-12-29 NOTE — Brief Op Note (Signed)
BRIEF CARDIAC CATHETERIZATION NOTE  12/29/2019  2:39 PM  PATIENT:  Mike Decker  79 y.o. male  PRE-OPERATIVE DIAGNOSIS:  NSTEMI  POST-OPERATIVE DIAGNOSIS:  NSTEMI  PROCEDURE:  Procedure(s): LEFT HEART CATH AND CORONARY ANGIOGRAPHY (N/A) CORONARY STENT INTERVENTION (N/A)  SURGEON:  Surgeon(s) and Role:    * Ketan Renz, Harrell Gave, MD - Primary  FINDINGS: 1. Severe single vessel CAD with 90% proximal/mid LCx stenosis. 2. Mild to moderate disease involving LAD and RCA with patent RCA stent. 3. Mildly elevated LVEDP (15-20 mmHg). 4. Successful PCI to proximal/mid LCx using Resolute Onyx 2.5 x 15 mm drug-eluting stent (postdilated to 2.8 mm) with 0% residual stenosis and TIMI-3 flow.  RECOMMENDATIONS: 1. Continue DAPT with aspirin and clopidogrel for at least 12 months, ideally longer. 2. Aggressive secondary prevention.  Nelva Bush, MD [facility]

## 2019-12-29 NOTE — Progress Notes (Signed)
ANTICOAGULATION CONSULT NOTE - Initial Consult  Pharmacy Consult for Heparin Indication: chest pain/ACS  Allergies  Allergen Reactions  . Gabapentin     Other reaction(s): Other (See Comments) Tremors  . Peanut-Containing Drug Products Anaphylaxis  . Penicillins Hives and Rash    Has patient had a PCN reaction causing immediate rash, facial/tongue/throat swelling, SOB or lightheadedness with hypotension: Yes Has patient had a PCN reaction causing severe rash involving mucus membranes or skin necrosis: No Has patient had a PCN reaction that required hospitalization No Has patient had a PCN reaction occurring within the last 10 years: No If all of the above answers are "NO", then may proceed with Cephalosporin use.  . Bee Venom Swelling  . Influenza Vaccines Hives  . Inh [Isoniazid] Hives  . Kenalog [Triamcinolone Acetonide] Hives  . Levaquin [Levofloxacin] Other (See Comments)    Tendon, ligament pain.   . Nalfon [Fenoprofen Calcium] Hives  . Naproxen   . Peanut Oil   . Nsaids Rash    Nalfon 600 Nalfon 600    Patient Measurements: Height: 5\' 6"  (167.6 cm) Weight: 196 lb 14.4 oz (89.3 kg) IBW/kg (Calculated) : 63.8 HEPARIN DW (KG): 82.6  Vital Signs: Temp: 97.9 F (36.6 C) (01/18 2217) Temp Source: Oral (01/18 2217) BP: 167/80 (01/19 0028) Pulse Rate: 85 (01/19 0029)  Labs: Recent Labs    12/28/19 1522 12/28/19 2217 12/29/19 0028  HGB 11.7* 11.7*  --   HCT 37.2* 36.3*  --   PLT 279 292  --   CREATININE 1.98* 1.74*  --   TROPONINIHS  --  23* 373*   Estimated Creatinine Clearance: 36 mL/min (A) (by C-G formula based on SCr of 1.74 mg/dL (H)).  Medical History: Past Medical History:  Diagnosis Date  . Anemia   . Arthritis   . Atrioventricular canal (AVC)    irregular heart beats  . Barrett esophagus   . 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 Chronic diastolic CHF (congestive heart failure) ([place])   . Colon polyp   . Diabetes mellitus without  complication ([place])   . Diverticulosis   . Gout   . Heart disease   . Hemangioma    liver  . Hyperlipidemia   . Hypertension   . Myocardial infarct ([place])   . Ocular hypertension   . Peripheral vascular disease ([place])   . Skin cancer   . Sleep apnea   . Vitreoretinal degeneration     Medications:  (Not in a hospital admission)   Assessment: Pharmacy asked to initiate and manage Heparin for ACS.  Pt not on anticoagulants per current med list.  Baseline labs ordered.  Goal of Therapy:  Heparin level 0.3-0.7 units/ml Monitor platelets by anticoagulation protocol: Yes   Plan:  Heparin 4000 units bolus x 1 then infusion at 1000 units/hr Check heparin level in 8 hours  [place] Crane, Latise Dilley A 12/29/2019,1:53 AM

## 2019-12-29 NOTE — Progress Notes (Signed)
ANTICOAGULATION CONSULT NOTE - Initial Consult  Pharmacy Consult for Heparin Indication: chest pain/ACS  Allergies  Allergen Reactions  . Gabapentin     Other reaction(s): Other (See Comments) Tremors  . Peanut-Containing Drug Products Anaphylaxis  . Penicillins Hives and Rash    Has patient had a PCN reaction causing immediate rash, facial/tongue/throat swelling, SOB or lightheadedness with hypotension: Yes Has patient had a PCN reaction causing severe rash involving mucus membranes or skin necrosis: No Has patient had a PCN reaction that required hospitalization No Has patient had a PCN reaction occurring within the last 10 years: No If all of the above answers are "NO", then may proceed with Cephalosporin use.  . Bee Venom Swelling  . Influenza Vaccines Hives  . Inh [Isoniazid] Hives  . Kenalog [Triamcinolone Acetonide] Hives  . Levaquin [Levofloxacin] Other (See Comments)    Tendon, ligament pain.   . Nalfon [Fenoprofen Calcium] Hives  . Naproxen   . Peanut Oil   . Nsaids Rash    Nalfon 600 Nalfon 600    Patient Measurements: Height: 5\' 6"  (167.6 cm) Weight: 197 lb 6.4 oz (89.5 kg) IBW/kg (Calculated) : 63.8 HEPARIN DW (KG): 82.7  Vital Signs: Temp: 97.9 F (36.6 C) (01/19 0802) Temp Source: Oral (01/19 0518) BP: 183/64 (01/19 0802) Pulse Rate: 69 (01/19 0802)  Labs: Recent Labs    12/28/19 1522 12/28/19 1522 12/28/19 2217 12/29/19 0028 12/29/19 0209 12/29/19 0440 12/29/19 1039  HGB 11.7*   < > 11.7*  --   --  11.1*  --   HCT 37.2*  --  36.3*  --   --  34.7*  --   PLT 279  --  292  --   --  270  --   APTT  --   --   --   --  34  --   --   LABPROT  --   --   --   --  12.6  --   --   INR  --   --   --   --  1.0  --   --   HEPARINUNFRC  --   --   --   --   --   --  0.37  CREATININE 1.98*  --  1.74*  --   --  1.67*  --   TROPONINIHS  --   --  23* 373*  --  2,426*  --    < > = values in this interval not displayed.   Estimated Creatinine Clearance: 37.6  mL/min (A) (by C-G formula based on SCr of 1.67 mg/dL (H)).  Medical History: Past Medical History:  Diagnosis Date  . Anemia   . Arthritis   . Atrioventricular canal (AVC)    irregular heart beats  . Barrett esophagus   . 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 Chronic diastolic CHF (congestive heart failure) ([place])   . Colon polyp   . Diabetes mellitus without complication ([place])   . Diverticulosis   . Gout   . Heart disease   . Hemangioma    liver  . Hyperlipidemia   . Hypertension   . Myocardial infarct ([place])   . Ocular hypertension   . Peripheral vascular disease ([place])   . Skin cancer   . Sleep apnea   . Vitreoretinal degeneration     Medications:  Medications Prior to Admission  Medication Sig Dispense Refill Last Dose  . acetaminophen (TYLENOL) 500 MG tablet Take 1,000 mg  by mouth every 6 (six) hours as needed for mild pain, moderate pain or headache.   prn at prn  . allopurinol (ZYLOPRIM) 100 MG tablet Take 100 mg by mouth daily.    12/28/2019 at 1000  . amLODipine (NORVASC) 5 MG tablet Take 5 mg by mouth daily.    12/28/2019 at 1000  . aspirin EC 81 MG EC tablet Take 1 tablet (81 mg total) by mouth daily. 60 tablet 0 12/28/2019 at 1000  . budesonide-formoterol (SYMBICORT) 160-4.5 MCG/ACT inhaler Inhale 2 puffs into the lungs 2 (two) times daily as needed.    12/28/2019 at Unknown time  . cetirizine (ZYRTEC) 5 MG tablet Take 5 mg by mouth daily as needed for allergies or rhinitis.    prn at prn  . citalopram (CELEXA) 10 MG tablet Take 10 mg by mouth daily.   12/28/2019 at 1000  . clopidogrel (PLAVIX) 75 MG tablet Take 1 tablet (75 mg total) by mouth daily. 30 tablet 0 12/28/2019 at 1000  . colchicine 0.6 MG tablet Take 1 tablet (0.6 mg total) by mouth daily. (Patient taking differently: Take 1.8 mg by mouth daily as needed (gout flares). ) 30 tablet 0 prn at prn  . dextromethorphan-guaiFENesin (MUCINEX DM) 30-600 MG 12hr tablet Take 1 tablet by mouth 2 (two) times daily  as needed for cough.   12/28/2019 at 1000  . EPINEPHrine (EPI-PEN) 0.3 mg/0.3 mL SOAJ injection Inject into the muscle as needed (anaphylaxis).    prn at prn  . ferrous sulfate 325 (65 FE) MG EC tablet Take 1 tablet (325 mg total) by mouth daily. 90 tablet 3 12/28/2019 at 1000  . fluticasone (FLONASE) 50 MCG/ACT nasal spray Place 2 sprays into both nostrils 2 (two) times daily.   3 12/28/2019 at 1000  . folic acid (V-R FOLIC ACID) 213 MCG tablet Take 1 tablet (400 mcg total) by mouth daily. 90 tablet 3 12/28/2019 at 1000  . furosemide (LASIX) 20 MG tablet Take 2 tablets (40 mg total) by mouth daily.   12/28/2019 at 1000  . hydrALAZINE (APRESOLINE) 25 MG tablet Take 1 tablet (25 mg total) by mouth every 8 (eight) hours. 60 tablet 0 12/28/2019 at 1500  . insulin glargine (LANTUS) 100 UNIT/ML injection Inject 0.45 mLs (45 Units total) into the skin at bedtime. 10 mL 11 12/27/2019 at 2000  . ipratropium-albuterol (DUONEB) 0.5-2.5 (3) MG/3ML SOLN Take 3 mLs by nebulization every 4 (four) hours as needed. 360 mL 0 12/28/2019 at Unknown time  . isosorbide mononitrate (IMDUR) 60 MG 24 hr tablet Take 60 mg by mouth daily.    12/28/2019 at 1000  . Magnesium 250 MG TABS Take 250 mg by mouth daily.   12/28/2019 at 1000  . methocarbamol (ROBAXIN) 500 MG tablet Take 500 mg by mouth 2 (two) times daily.   0 12/28/2019 at 1000  . metoprolol tartrate (LOPRESSOR) 50 MG tablet Take 75 mg by mouth 2 (two) times daily.    12/28/2019 at 1000  . montelukast (SINGULAIR) 10 MG tablet TAKE 1 TABLET BY MOUTH EVERYDAY AT BEDTIME 30 tablet 3 12/27/2019 at 2000  . Omega-3 Fatty Acids (FISH OIL) 1000 MG CAPS Take 1 capsule by mouth daily.    12/28/2019 at 1000  . pantoprazole (PROTONIX) 40 MG tablet Take 1 tablet (40 mg total) by mouth 2 (two) times daily before a meal.   12/28/2019 at 1800  . rosuvastatin (CRESTOR) 40 MG tablet Take 40 mg by mouth every evening.    12/27/2019  at 2000  . sacubitril-valsartan (ENTRESTO) 24-26 MG Take 1 tablet by  mouth 2 (two) times daily. 60 tablet 0 12/28/2019 at 1000  . vitamin C (ASCORBIC ACID) 250 MG tablet Take 250 mg by mouth daily.   12/28/2019 at 1000  . Vitamin D, Ergocalciferol, (DRISDOL) 50000 units CAPS capsule Take 50,000 Units by mouth every 30 (thirty) days.   1 Past Month at Unknown time  . zinc gluconate 50 MG tablet Take 50 mg by mouth daily.   12/28/2019 at 1000  . nitroGLYCERIN (NITROSTAT) 0.4 MG SL tablet Place 0.4 mg under the tongue every 5 (five) minutes x 3 doses as needed for chest pain.    prn at prn    Assessment: Pharmacy asked to initiate and manage Heparin for ACS.  Pt not on anticoagulants per current med list.  Baseline labs ordered. Plan for LHC.   1/19 1039 HL 0.37   Goal of Therapy:  Heparin level 0.3-0.7 units/ml Monitor platelets by anticoagulation protocol: Yes   Plan:  Heparin level therepeutic x 1. Will continue infusion at 1000 units/hr. Check heparin level in 8 hours. Daily CBC.   Oswald Hillock, PharmD, BCPS 12/29/2019,11:13 AM

## 2019-12-30 ENCOUNTER — Telehealth: Payer: Self-pay

## 2019-12-30 ENCOUNTER — Encounter: Payer: Self-pay | Admitting: Cardiology

## 2019-12-30 LAB — BASIC METABOLIC PANEL
Anion gap: 8 (ref 5–15)
BUN: 32 mg/dL — ABNORMAL HIGH (ref 8–23)
CO2: 22 mmol/L (ref 22–32)
Calcium: 8.6 mg/dL — ABNORMAL LOW (ref 8.9–10.3)
Chloride: 109 mmol/L (ref 98–111)
Creatinine, Ser: 1.48 mg/dL — ABNORMAL HIGH (ref 0.61–1.24)
GFR calc Af Amer: 51 mL/min — ABNORMAL LOW (ref >60)
GFR calc non Af Amer: 44 mL/min — ABNORMAL LOW (ref >60)
Glucose, Bld: 88 mg/dL (ref 70–99)
Potassium: 4.2 mmol/L (ref 3.5–5.1)
Sodium: 139 mmol/L (ref 135–145)

## 2019-12-30 LAB — CBC
HCT: 35.3 % — ABNORMAL LOW (ref 39.0–52.0)
Hemoglobin: 11.1 g/dL — ABNORMAL LOW (ref 13.0–17.0)
MCH: 29.8 pg (ref 26.0–34.0)
MCHC: 31.4 g/dL (ref 30.0–36.0)
MCV: 94.9 fL (ref 80.0–100.0)
Platelets: 266 10*3/uL (ref 150–400)
RBC: 3.72 MIL/uL — ABNORMAL LOW (ref 4.22–5.81)
RDW: 14.6 % (ref 11.5–15.5)
WBC: 9.1 10*3/uL (ref 4.0–10.5)
nRBC: 0 % (ref 0.0–0.2)

## 2019-12-30 LAB — GLUCOSE, CAPILLARY
Glucose-Capillary: 68 mg/dL — ABNORMAL LOW (ref 70–99)
Glucose-Capillary: 95 mg/dL (ref 70–99)
Glucose-Capillary: 109 mg/dL — ABNORMAL HIGH (ref 70–99)
Glucose-Capillary: 199 mg/dL — ABNORMAL HIGH (ref 70–99)

## 2019-12-30 MED ORDER — COLCHICINE 0.6 MG PO TABS: 1.8000 mg | ORAL_TABLET | Freq: Every day | ORAL | Status: DC | PRN

## 2019-12-30 MED ORDER — INSULIN GLARGINE 100 UNIT/ML ~~LOC~~ SOLN: 35.0000 [IU] | Freq: Every day | SUBCUTANEOUS | 11 refills | Status: DC

## 2019-12-30 MED ORDER — ASPIRIN EC 81 MG PO TBEC
81.0000 mg | DELAYED_RELEASE_TABLET | Freq: Every day | ORAL | Status: DC
  Administered 2019-12-30: 09:00:00 81 mg via ORAL
  Filled 2019-12-30: qty 1

## 2019-12-30 MED ORDER — CLOPIDOGREL BISULFATE 75 MG PO TABS: 75.0000 mg | ORAL_TABLET | Freq: Every day | ORAL | 11 refills | Status: AC

## 2019-12-30 NOTE — Telephone Encounter (Signed)
Patient rescheduled appointment on 01/04/2020 to 01/21/2020. klh

## 2019-12-30 NOTE — Discharge Instructions (Signed)
Angina  Angina is extreme discomfort in the chest, neck, arm, jaw, or back. The discomfort is caused by a lack of blood in the middle layer of the heart wall (myocardium). There are four types of angina:  Stable angina. This is triggered by vigorous activity or exercise. It goes away when you rest or take angina medicine.  Unstable angina. This is a warning sign and can lead to a heart attack (acute coronary syndrome). This is a medical emergency. Symptoms come at rest and last a long time.  Microvascular angina. This affects the small coronary arteries. Symptoms include feeling tired and being short of breath.  Prinzmetal or variant angina. This is caused by a tightening (spasm) of the arteries that go to your heart. What are the causes? This condition is caused by atherosclerosis. This is the buildup of fat and cholesterol (plaque) in your arteries. The plaque may narrow or block the artery. Other causes of angina include:  Sudden tightening of the muscles of the arteries in the heart (coronary spasm).  Small artery disease (microvascular dysfunction).  Problems with any of your heart valves (heart valve disease).  A tear in an artery in your heart (coronary artery dissection).  Diseases of the heart muscle (cardiomyopathy), or other heart diseases. What increases the risk? You are more likely to develop this condition if you have:  High cholesterol.  High blood pressure (hypertension).  Diabetes.  A family history of heart disease.  An inactive (sedentary) lifestyle, or you do not exercise enough.  Depression.  Had radiation treatment to the left side of your chest. Other risk factors include:  Using tobacco.  Being obese.  Eating a diet high in saturated fats.  Being exposed to high stress or triggers of stress.  Using drugs, such as cocaine. Women have a greater risk for angina if:  They are older than 55.  They have gone through menopause (are  postmenopausal). What are the signs or symptoms? Common symptoms of this condition in both men and women may include:  Chest pain, which may: ? Feel like a crushing or squeezing in the chest, or like a tightness, pressure, fullness, or heaviness in the chest. ? Last for more than a few minutes at a time, or it may stop and come back (recur) over the course of a few minutes.  Pain in the neck, arm, jaw, or back.  Unexplained heartburn or indigestion.  Shortness of breath.  Nausea.  Sudden cold sweats. Women and people with diabetes may have unusual (atypical) symptoms, such as:  Fatigue.  Unexplained feelings of nervousness or anxiety.  Unexplained weakness.  Dizziness or fainting. How is this diagnosed? This condition may be diagnosed based on:  Your symptoms and medical history.  Electrocardiogram (ECG) to measure the electrical activity in your heart.  Blood tests.  Stress test to look for signs of blockage when your heart is stressed.  CT angiogram to examine your heart and the blood flow to it.  Coronary angiogram to check your coronary arteries for blockage. How is this treated? Angina may be treated with:  Medicines to: ? Prevent blood clots and heart attack. ? Relax blood vessels and improve blood flow to the heart (nitrates). ? Reduce blood pressure, improve the pumping action of the heart, and relax blood vessels that are spasming. ? Reduce cholesterol and help treat atherosclerosis.  A procedure to widen a narrowed or blocked coronary artery (angioplasty). A mesh tube may be placed in a coronary artery to   keep it open (coronary stenting).  Surgery to allow blood to go around a blocked artery (coronary artery bypass surgery). Follow these instructions at home: Medicines  Take over-the-counter and prescription medicines only as told by your health care provider.  Do not take the following medicines unless your health care provider approves: ? NSAIDs,  such as ibuprofen or naproxen. ? Vitamin supplements that contain vitamin A, vitamin E, or both. ? Hormone replacement therapy that contains estrogen with or without progestin. Eating and drinking   Eat a heart-healthy diet. This includes plenty of fresh fruits and vegetables, whole grains, low-fat (lean) protein, and low-fat dairy products.  Follow instructions from your health care provider about eating or drinking restrictions. Activity  Follow an exercise program approved by your health care provider.  Consider joining a cardiac rehabilitation program.  Take a break when you feel fatigued. Plan rest periods in your daily activities. Lifestyle   Do not use any products that contain nicotine or tobacco, such as cigarettes, e-cigarettes, and chewing tobacco. If you need help quitting, ask your health care provider.  If your health care provider says you can drink alcohol: ? Limit how much you use to:  0-1 drink a day for nonpregnant women.  0-2 drinks a day for men. ? Be aware of how much alcohol is in your drink. In the [place] one drink equals one 12 oz bottle of beer (355 mL), one 5 oz glass of wine (148 mL), or one 1 oz glass of hard liquor (44 mL). General instructions  Maintain a healthy weight.  Learn to manage stress.  Keep your vaccinations up to date. Get the flu (influenza) vaccine every year.  Talk to your health care provider if you feel depressed. Take a depression screening test to see if you are at risk for depression.  Work with your health care provider to manage other health conditions, such as hypertension or diabetes.  Keep all follow-up visits as told by your health care provider. This is important. Get help right away if:  You have pain in your chest, neck, arm, jaw, or back, and the pain: ? Lasts more than a few minutes. ? Is recurring. ? Is not relieved by taking medicines under the tongue (sublingual nitroglycerin). ? Increases in intensity or  frequency.  You have a lot of sweating without cause.  You have unexplained: ? Heartburn or indigestion. ? Shortness of breath or difficulty breathing. ? Nausea or vomiting. ? Fatigue. ? Feelings of nervousness or anxiety. ? Weakness.  You have sudden light-headedness or dizziness.  You faint. These symptoms may represent a serious problem that is an emergency. Do not wait to see if the symptoms will go away. Get medical help right away. Call your local emergency services (911 in the [place] Do not drive yourself to the hospital. Summary  Angina is extreme discomfort in the chest, neck, arm, jaw, or back that is caused by a lack of blood in the heart wall.  There are many symptoms of angina. They include chest pain, unexplained heartburn or indigestion, sudden cold sweats, and fatigue.  Angina may be treated with behavioral changes, medicine, or surgery.  Symptoms of angina may represent an emergency. Get medical help right away. Call your local emergency services (911 in the [place] Do not drive yourself to the hospital. This information is not intended to replace advice given to you by your health care provider. Make sure you discuss any questions you have with your health care provider.   Document Revised: 07/14/2018 Document Reviewed: 07/14/2018 [employer]vier Patient Education  2020 [employer]  

## 2019-12-30 NOTE — Progress Notes (Signed)
Cardiovascular and Pulmonary Nurse Navigator Note:    79 year old male with past medical history of coronary artery disease s/p PCI to RCA, history of CVA, chronic kidney disease, peripheral vascular disease, type 2 diabetes, COPD, hypertension, hyperlipidemia, and GERD who presented to the ED with chest pain and shortness of breath.  Patient ruled in for NSTEMI.  Patient underwent Cardiac Cath on 12/29/2019.    CORONARY STENT INTERVENTION  LEFT HEART CATH AND CORONARY ANGIOGRAPHY  Conclusion  Conclusions: 1. Multivessel coronary artery disease, including diffuse mid LAD disease of up to 50% and 70-80% apical LAD stenosis, 80-90% focal proximal LCx lesion, 50% OM1 stenosis, and 40% mid and distal RCA lesions. 2. Widely patent proximal RCA stent. 3. Mildly elevated left ventricular filling pressure. 4. Successful PCI to proximal LCx using Resolute Onyx 2.5 x 15 mm drug-eluting stent (postdilated to 2.8 mm) with 0% residual stenosis and TIMI-3 flow.  Recommendations: 1. Continue dual antiplatelet therapy with aspirin and clopidogrel for at least 12 months, ideally longer. 2. Aggressive secondary prevention and medical management of residual coronary artery disease that appears stable compared with most recent catheterization in 2019.  Nelva Bush, MD [facility]    NOTE:   This RN contacted patient by phone.  Informed patient that his cardiologist has referred him to outpatient Cardiac Rehab at [facility].  Patient reported that he was supposed to start Cardiac Rehab last year, but the COVID-19 pandemic interfered with him participating.   Patient reported that he has participated in Cardiac Rehab before.  Explained to patient there have been a few changes in the way we do the program given the COVID-19 pandemic.  Patient stated, "I definitely need to participate in this program."  Patient understands the Cardiac Rehab department will contact him in one to two weeks after discharge to schedule  his first appointment.   Roanna Epley, RN, BSN, [facility]  Cardiovascular & Pulmonary Nurse Navigator  Direct Line: [phone]  Department Phone #: [phone] Fax: [phone]  Email Address: [email or web address]

## 2019-12-30 NOTE — Discharge Summary (Signed)
Physician Discharge Summary   Mike Decker  male DOB: 1941/04/16  HUT:[id number]  PCP: Cletis Athens, MD  Admit date: 12/28/2019 Discharge date: 12/30/2019  Admitted From: home Disposition:  home CODE STATUS: Full code  Discharge Instructions    AMB Referral to Cardiac Rehabilitation - Phase II   Complete by: As directed    Diagnosis:  Coronary Stents NSTEMI     After initial evaluation and assessments completed: Virtual Based Care may be provided alone or in conjunction with Phase 2 Cardiac Rehab based on patient barriers.: Yes   Diet - low sodium heart healthy   Complete by: As directed    Discharge instructions   Complete by: As directed    Now that you have a new cardiac stent, you will need to take both ASA 81 and Plavix for a year, until 12/29/2020.  Dr. Enzo Bi - -   Increase activity slowly   Complete by: As directed        Hospital Course:  For full details, please see H&P, progress notes, consult notes and ancillary notes.  Briefly,  Mike Decker is a 79 year old Caucasian male with history of CAD status post stent, HTN, HLD, DM2, COPD, GERD, depression, OSA, PVD, CKD stage IV, CVA presented to the hospital with acute onset of midsternal chest pain felt as tightness and graded 4/10 in severity with radiation to the left arm as well as his jaw.  NSTEMI history of coronary artery disease s/p stents  Initial trop 23 and increased to 2426.  Pt was started on heparin.  Pt underwent a left cardiac catheterization which revealed mild to moderate disease in the LAD and RCA with a patent stent in the RCA.  Patient had a 95% stenosis in the left circumflex and received a DES in left circumflex.  Has been on aspirin and Plavix prior to admission.  Pt will continue with aspirin and Plavix at 81 and 75 mg respectively, rosuvastatin at 40 mg daily, metoprolol tartrate 75 mg twice daily, isosorbide mononitrate at 60 mg daily.  Phase 2 cardiac rehab has been  ordered.   Follow-up with Dr. Clayborn Bigness in 1 week.  Hypertension Continued on metoprolol, entresto, amlodipine, & imdur   DM2 on insulin Pt was continued on home Lantus 45u nightly while inpatient, and noted to have hypoglycemia in the morning.  Pt therefore was discharged on reduced dose of Lantus, 35u nightly.  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   Leukocytosis, resolved likely reactive   Recent dx of Pneumonia Pt was previously taking doxycycline as outpatient as per his PCP but doxycycline known to cause esophagitis so d/c'ed.  Pt received 2 more days of azithromycin while inpatient.    Dyslipidemia:  continued on statin   Gout:  continued allopurinol.  Depression:  continued on home dose of citalopram  Anxiety continued on xanax prn   Chronic diastolic CHF continued home dose of entresto and lasix  OSA:  continued on CPAP qhs   Discharge Diagnoses:  Active Problems:   NSTEMI (non-ST elevated myocardial infarction) ([place])   Lobar pneumonia [facility])    Discharge Instructions:  Allergies as of 12/30/2019      Reactions   Gabapentin    Other reaction(s): Other (See Comments) Tremors   Peanut-containing Drug Products Anaphylaxis   Penicillins Hives, Rash   Has patient had a PCN reaction causing immediate rash, facial/tongue/throat swelling, SOB or lightheadedness with hypotension: Yes Has patient had a PCN reaction causing severe rash involving mucus membranes or skin  necrosis: No Has patient had a PCN reaction that required hospitalization No Has patient had a PCN reaction occurring within the last 10 years: No If all of the above answers are "NO", then may proceed with Cephalosporin use.   Bee Venom Swelling   Influenza Vaccines Hives   Inh [isoniazid] Hives   Kenalog [triamcinolone Acetonide] Hives   Levaquin [levofloxacin] Other (See Comments)   Tendon, ligament pain.    Nalfon [fenoprofen Calcium] Hives   Naproxen    Peanut Oil    Nsaids Rash   Nalfon  600 Nalfon 600      Medication List    TAKE these medications   acetaminophen 500 MG tablet Commonly known as: TYLENOL Take 1,000 mg by mouth every 6 (six) hours as needed for mild pain, moderate pain or headache.   allopurinol 100 MG tablet Commonly known as: ZYLOPRIM Take 100 mg by mouth daily. Notes to patient: Today when home.   amLODipine 5 MG tablet Commonly known as: NORVASC Take 5 mg by mouth daily. Notes to patient: Tomorrow at 9A.   aspirin 81 MG EC tablet Take 1 tablet (81 mg total) by mouth daily. Notes to patient: Tomorrow at 9A.    budesonide-formoterol 160-4.5 MCG/ACT inhaler Commonly known as: SYMBICORT Inhale 2 puffs into the lungs 2 (two) times daily as needed.   cetirizine 5 MG tablet Commonly known as: ZYRTEC Take 5 mg by mouth daily as needed for allergies or rhinitis.   citalopram 10 MG tablet Commonly known as: CELEXA Take 10 mg by mouth daily. Notes to patient: Tomorrow at 9A.    clopidogrel 75 MG tablet Commonly known as: Plavix Take 1 tablet (75 mg total) by mouth daily. Notes to patient: Tomorrow at 9A.    colchicine 0.6 MG tablet Take 3 tablets (1.8 mg total) by mouth daily as needed (gout flares).   dextromethorphan-guaiFENesin 30-600 MG 12hr tablet Commonly known as: MUCINEX DM Take 1 tablet by mouth 2 (two) times daily as needed for cough.   EPINEPHrine 0.3 mg/0.3 mL Soaj injection Commonly known as: EPI-PEN Inject into the muscle as needed (anaphylaxis).   ferrous sulfate 325 (65 FE) MG EC tablet Take 1 tablet (325 mg total) by mouth daily. Notes to patient: Tomorrow at 9A.    Fish Oil 1000 MG Caps Take 1 capsule by mouth daily. Notes to patient: Today when home.    fluticasone 50 MCG/ACT nasal spray Commonly known as: FLONASE Place 2 sprays into both nostrils 2 (two) times daily. Notes to patient: Tonight at 9P.    folic acid 381 MCG tablet Commonly known as: V-R FOLIC ACID Take 1 tablet (400 mcg total) by mouth  daily. Notes to patient: Tomorrow at 9A.    furosemide 20 MG tablet Commonly known as: LASIX Take 2 tablets (40 mg total) by mouth daily. Notes to patient: Tomorrow at 9A.    hydrALAZINE 25 MG tablet Commonly known as: APRESOLINE Take 1 tablet (25 mg total) by mouth every 8 (eight) hours. Notes to patient: Today at 2P.    insulin glargine 100 UNIT/ML injection Commonly known as: LANTUS Inject 0.35 mLs (35 Units total) into the skin at bedtime. This is a decrease from your previous 45 units nightly. What changed:   how much to take  additional instructions   ipratropium-albuterol 0.5-2.5 (3) MG/3ML Soln Commonly known as: DUONEB Take 3 mLs by nebulization every 4 (four) hours as needed.   isosorbide mononitrate 60 MG 24 hr tablet Commonly known as: IMDUR  Take 60 mg by mouth daily. Notes to patient: Tomorrow at 9A.    Magnesium 250 MG Tabs Take 250 mg by mouth daily. Notes to patient: Tomorrow at 9A.    methocarbamol 500 MG tablet Commonly known as: ROBAXIN Take 500 mg by mouth 2 (two) times daily. Notes to patient: Tonight at 9P.    metoprolol tartrate 50 MG tablet Commonly known as: LOPRESSOR Take 75 mg by mouth 2 (two) times daily. Notes to patient: Tonight at 9P.    montelukast 10 MG tablet Commonly known as: SINGULAIR TAKE 1 TABLET BY MOUTH EVERYDAY AT BEDTIME Notes to patient: Tonight at bedtime.    nitroGLYCERIN 0.4 MG SL tablet Commonly known as: NITROSTAT Place 0.4 mg under the tongue every 5 (five) minutes x 3 doses as needed for chest pain.   pantoprazole 40 MG tablet Commonly known as: PROTONIX Take 1 tablet (40 mg total) by mouth 2 (two) times daily before a meal. Notes to patient: Today before dinner.    rosuvastatin 40 MG tablet Commonly known as: CRESTOR Take 40 mg by mouth every evening. Notes to patient: Tonight at [place] 24-26 MG Commonly known as: ENTRESTO Take 1 tablet by mouth 2 (two) times daily. Notes to  patient: Tonight at 9P.    vitamin C 250 MG tablet Commonly known as: ASCORBIC ACID Take 250 mg by mouth daily. Notes to patient: Tomorrow at 9A.    Vitamin D (Ergocalciferol) 1.25 MG (50000 UNIT) Caps capsule Commonly known as: DRISDOL Take 50,000 Units by mouth every 30 (thirty) days. Notes to patient: Resume home regimen.    zinc gluconate 50 MG tablet Take 50 mg by mouth daily. Notes to patient: Today when home.        Follow-up Information    Yolonda Kida, MD. Go on 01/06/2020.   Specialties: Cardiology, Internal Medicine Why: appointment at [facility] information: [place] [address] [phone]        [facility] Cardiac and Pulmonary Rehab.   Specialty: Cardiac Rehabilitation Why: Your Cardiologist has referred you to outpatient Cardiac Rehab at [facility].  The Cardiac Rehab Department will contact you within one to two weeks after discharge to schedule your first appointment.   Contact information: [place] [id number] [address] [place]       Cletis Athens, MD. Schedule an appointment as soon as possible for a visit in 1 week(s).   Specialty: Cardiology Contact information: [address] [place] [address] [phone]           Allergies  Allergen Reactions  . Gabapentin     Other reaction(s): Other (See Comments) Tremors  . Peanut-Containing Drug Products Anaphylaxis  . Penicillins Hives and Rash    Has patient had a PCN reaction causing immediate rash, facial/tongue/throat swelling, SOB or lightheadedness with hypotension: Yes Has patient had a PCN reaction causing severe rash involving mucus membranes or skin necrosis: No Has patient had a PCN reaction that required hospitalization No Has patient had a PCN reaction occurring within the last 10 years: No If all of the above answers are "NO", then may proceed with Cephalosporin use.  . Bee Venom Swelling  . Influenza  Vaccines Hives  . Inh [Isoniazid] Hives  . Kenalog [Triamcinolone Acetonide] Hives  . Levaquin [Levofloxacin] Other (See Comments)    Tendon, ligament pain.   . Nalfon [Fenoprofen Calcium] Hives  . Naproxen   . Peanut Oil   . Nsaids  Rash    Nalfon 600 Nalfon 600     The results of significant diagnostics from this hospitalization (including imaging, microbiology, ancillary and laboratory) are listed below for reference.   Consultations:   Procedures/Studies: CT ABDOMEN PELVIS WO CONTRAST  Result Date: 12/16/2019 CLINICAL DATA:  Diverticulitis suspected. Left upper quadrant and around movement EXAM: CT ABDOMEN AND PELVIS WITHOUT CONTRAST TECHNIQUE: Multidetector CT imaging of the abdomen and pelvis was performed following the standard protocol without IV contrast. COMPARISON:  05/07/2017 FINDINGS: Lower chest:  No contributory findings. Hepatobiliary: Subtle low-density lesion on the upper right liver, hemangioma based on previous imaging/reporting.No evidence of biliary obstruction or stone. Pancreas: Unremarkable. Spleen: Unremarkable. Adrenals/Urinary Tract: Negative adrenals. No hydronephrosis or stone. Unremarkable bladder. Stomach/Bowel: No obstruction. No appendicitis. Sigmoid diverticulosis. No diverticulitis noted. Vascular/Lymphatic: No acute vascular abnormality. 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 No mass or adenopathy. Reproductive:Prostatectomy without worrisome nodularity. Other: No ascites or pneumoperitoneum. Fatty right inguinal hernia. Left inguinal hernia repair. Musculoskeletal: No acute abnormalities. IMPRESSION: 1. No acute finding including evidence of diverticulitis 2. Fatty right inguinal hernia. 3. Left colonic diverticulosis. Electronically Signed   By: Monte Fantasia M.D.   On: 12/16/2019 08:00   DG Chest 2 View  Result Date: 12/28/2019 CLINICAL DATA:  Chest pain. EXAM: CHEST - 2 VIEW COMPARISON:   Radiograph 12/21/2019, CT 12/11/2018 FINDINGS: The cardiomediastinal contours are normal. Bilateral subpleural opacities have improved from prior exam. Pulmonary vasculature is normal. No consolidation, pleural effusion, or pneumothorax. No acute osseous abnormalities are seen. IMPRESSION: No acute chest findings. Improving bilateral subpleural opacities. Electronically Signed   By: Keith Rake M.D.   On: 12/28/2019 22:45   DG Chest 2 View  Result Date: 12/21/2019 CLINICAL DATA:  79 year old male with shortness of breath and left-sided chest pain. EXAM: CHEST - 2 VIEW COMPARISON:  Chest radiograph dated 12/16/2019. FINDINGS: Minimal bilateral subpleural hazy densities may represent atelectasis. Atypical infection is not excluded. Clinical correlation is recommended. No lobar consolidation, pleural effusion, or pneumothorax. Stable cardiac silhouette. No acute osseous pathology. IMPRESSION: Bilateral subpleural atelectasis. Atypical infection is not excluded. Clinical correlation is recommended. No focal consolidation. Electronically Signed   By: Anner Crete M.D.   On: 12/21/2019 21:30   DG Chest 2 View  Result Date: 12/14/2019 CLINICAL DATA:  Chest pain EXAM: CHEST - 2 VIEW COMPARISON:  11/24/2019, 06/10/2019 FINDINGS: No consolidation or pleural effusion. Mild coarse bronchitic changes. Stable cardiomediastinal silhouette with borderline cardiomegaly. No pneumothorax. IMPRESSION: Low lung volumes with coarse bronchitic changes. No consolidative airspace disease. Borderline to mild cardiomegaly. Electronically Signed   By: Donavan Foil M.D.   On: 12/14/2019 03:18   CARDIAC CATHETERIZATION  Result Date: 12/29/2019 Conclusions: 1. Multivessel coronary artery disease, including diffuse mid LAD disease of up to 50% and 70-80% apical LAD stenosis, 80-90% focal proximal LCx lesion, 50% OM1 stenosis, and 40% mid and distal RCA lesions. 2. Widely patent proximal RCA stent. 3. Mildly elevated left  ventricular filling pressure. 4. Successful PCI to proximal LCx using Resolute Onyx 2.5 x 15 mm drug-eluting stent (postdilated to 2.8 mm) with 0% residual stenosis and TIMI-3 flow. Recommendations: 1. Continue dual antiplatelet therapy with aspirin and clopidogrel for at least 12 months, ideally longer. 2. Aggressive secondary prevention and medical management of residual coronary artery disease that appears stable compared with most recent catheterization in 2019. Nelva Bush, MD [facility]   US RENAL  Result Date: 12/16/2019 CLINICAL DATA:  Left upper quadrant pain.  History of renal stone. EXAM: RENAL / URINARY TRACT ULTRASOUND COMPLETE COMPARISON:  Abdominal CT from earlier the same day FINDINGS: Right Kidney: Renal measurements: 10.7 x 4.8 x 4.8 cm = volume: 130 mL. No hydronephrosis or mass. Two small cystic structures measuring 12 and 6 mm. Left Kidney: Renal measurements: 11.5 x 5 x 4.7 cm = volume: 141 mL. Echogenicity within normal limits. No mass or hydronephrosis visualized. Bladder: Appears normal for degree of bladder distention. IMPRESSION: Essentially negative renal ultrasound. No renal calculi were seen on preceding abdominal CT. Electronically Signed   By: Monte Fantasia M.D.   On: 12/16/2019 09:02   DG Chest Port 1 View  Result Date: 12/16/2019 CLINICAL DATA:  Dyspnea, short of breath with exertion, history of prostate esophageal cancer, CHF, diabetes mellitus, hypertension, MI EXAM: PORTABLE CHEST 1 VIEW COMPARISON:  Portable exam 1009 hours compared to 12/14/2019 FINDINGS: Borderline enlargement of cardiac silhouette. Mediastinal contours and pulmonary vascularity normal. Lungs clear. No pulmonary infiltrate, pleural effusion or pneumothorax. Osseous structures unremarkable. IMPRESSION: No acute abnormalities. Electronically Signed   By: Lavonia Dana M.D.   On: 12/16/2019 10:28   ECHOCARDIOGRAM COMPLETE  Result Date: 12/29/2019   ECHOCARDIOGRAM REPORT   Patient Name:    Mike Decker Date of Exam: 12/29/2019 Medical Rec #:  [id number]            Height:       66.0 in Accession #:    [id number]           Weight:       197.4 lb Date of Birth:  [birth year]            BSA:          1.99 m Patient Age:    16 years             BP:           183/64 mmHg Patient Gender: M                    HR:           51 bpm. Exam Location:  [facility] Procedure: 2D Echo, Color Doppler and Cardiac Doppler Indications:     I21.4 NSTEMI  History:         Patient has prior history of Echocardiogram examinations, most                  recent 12/14/2019. CHF, PVD; Risk Factors:Hypertension, Diabetes                  and Dyslipidemia.  Sonographer:     Charmayne Sheer RDCS (AE) Referring Phys:  [id number] Arvella Merles Bonneau Diagnosing Phys: Bartholome Bill MD  Sonographer Comments: Suboptimal subcostal window. IMPRESSIONS  1. Left ventricular ejection fraction, by visual estimation, is 60 to 65%. The left ventricle has normal function. Left ventricular septal wall thickness was mildly increased. Mildly increased left ventricular posterior wall thickness. There is mildly increased left ventricular hypertrophy.  2. The left ventricle has no regional wall motion abnormalities.  3. Global right ventricle has normal systolic function.The right ventricular size is normal. No increase in right ventricular wall thickness.  4. Left atrial size was mildly dilated.  5. Right atrial size was normal.  6. The mitral valve is grossly normal. No evidence of mitral valve regurgitation.  7. The tricuspid valve is not well visualized.  8. The tricuspid valve is not well visualized. Tricuspid valve regurgitation is trivial.  9.  The aortic valve is tricuspid. Aortic valve regurgitation is trivial. Mild aortic valve sclerosis without stenosis. 10. The pulmonic valve was not well visualized. Pulmonic valve regurgitation is trivial. 11. The aortic root was not well visualized. 12. The interatrial septum was not assessed. FINDINGS  Left Ventricle: Left  ventricular ejection fraction, by visual estimation, is 60 to 65%. The left ventricle has normal function. The left ventricle has no regional wall motion abnormalities. Mildly increased left ventricular posterior wall thickness. There is mildly increased left ventricular hypertrophy. Right Ventricle: The right ventricular size is normal. No increase in right ventricular wall thickness. Global RV systolic function is has normal systolic function. Left Atrium: Left atrial size was mildly dilated. Right Atrium: Right atrial size was normal in size Pericardium: There is no evidence of pericardial effusion. Mitral Valve: The mitral valve is grossly normal. No evidence of mitral valve regurgitation. MV peak gradient, 4.9 mmHg. Tricuspid Valve: The tricuspid valve is not well visualized. Tricuspid valve regurgitation is trivial. Aortic Valve: The aortic valve is tricuspid. Aortic valve regurgitation is trivial. Mild aortic valve sclerosis is present, with no evidence of aortic valve stenosis. Aortic valve mean gradient measures 10.0 mmHg. Aortic valve peak gradient measures 19.1  mmHg. Aortic valve area, by VTI measures 1.73 cm. Pulmonic Valve: The pulmonic valve was not well visualized. Pulmonic valve regurgitation is trivial. Pulmonic regurgitation is trivial. Aorta: The aortic root was not well visualized. IAS/Shunts: The interatrial septum was not assessed.  LEFT VENTRICLE PLAX 2D LVIDd:         3.98 cm  Diastology LVIDs:         2.61 cm  LV e' lateral:   7.29 cm/s LV PW:         1.08 cm  LV E/e' lateral: 10.9 LV IVS:        1.05 cm  LV e' medial:    6.20 cm/s LVOT diam:     2.30 cm  LV E/e' medial:  12.8 LV SV:         44 ml LV SV Index:   21.39 LVOT Area:     4.15 cm  RIGHT VENTRICLE RV Basal diam:  3.27 cm LEFT ATRIUM             Index       RIGHT ATRIUM          Index LA diam:        4.70 cm 2.36 cm/m  RA Area:     8.89 cm LA Vol (A2C):   61.4 ml 30.87 ml/m RA Volume:   15.50 ml 7.79 ml/m LA Vol (A4C):   71.3  ml 35.85 ml/m LA Biplane Vol: 66.6 ml 33.49 ml/m  AORTIC VALVE                    PULMONIC VALVE AV Area (Vmax):    1.72 cm     PV Vmax:       1.32 m/s AV Area (Vmean):   1.86 cm     PV Vmean:      84.200 cm/s AV Area (VTI):     1.73 cm     PV VTI:        0.258 m AV Vmax:           218.50 cm/s  PV Peak grad:  7.0 mmHg AV Vmean:          149.500 cm/s PV Mean grad:  3.0 mmHg AV VTI:  0.458 m AV Peak Grad:      19.1 mmHg AV Mean Grad:      10.0 mmHg LVOT Vmax:         90.20 cm/s LVOT Vmean:        67.000 cm/s LVOT VTI:          0.191 m LVOT/AV VTI ratio: 0.42  AORTA Ao Root diam: 2.80 cm MITRAL VALVE MV Area (PHT): 2.66 cm             SHUNTS MV Peak grad:  4.9 mmHg             Systemic VTI:  0.19 m MV Mean grad:  1.0 mmHg             Systemic Diam: 2.30 cm MV Vmax:       1.11 m/s MV Vmean:      52.2 cm/s MV VTI:        0.38 m MV PHT:        82.65 msec MV Decel Time: 285 msec MV E velocity: 79.50 cm/s 103 cm/s MV A velocity: 73.70 cm/s 70.3 cm/s MV E/A ratio:  1.08       1.5  Bartholome Bill MD Electronically signed by Bartholome Bill MD Signature Date/Time: 12/29/2019/1:04:34 PM    Final    ECHOCARDIOGRAM COMPLETE  Result Date: 12/15/2019   ECHOCARDIOGRAM REPORT   Patient Name:   Mike Decker Date of Exam: 12/14/2019 Medical Rec #:  [id number]        Height:       66.0 in Accession #:    [id number]       Weight:       206.0 lb Date of Birth:  21-Jan-1941        BSA:          2.03 m Patient Age:    79 years         BP:           130/57 mmHg Patient Gender: M                HR:           68 bpm. Exam Location:  [facility] Procedure: 2D Echo, Color Doppler and Cardiac Doppler Indications:     Chest pain 786.50  History:         Patient has prior history of Echocardiogram examinations, most                  recent 11/15/2018. Risk Factors:Sleep Apnea, Hypertension and                  Diabetes. Mi, PVD.  Sonographer:     Sherrie Sport RDCS (AE) Referring Phys:  [id number] Soledad Gerlach NIU Diagnosing Phys: Yolonda Kida MD   Sonographer Comments: Suboptimal parasternal window. IMPRESSIONS  1. Left ventricular ejection fraction, by visual estimation, is 50 to 55%. The left ventricle has normal function. Normal left ventricular posterior wall thickness. There is no left ventricular hypertrophy.  2. The left ventricle has no regional wall motion abnormalities.  3. Global right ventricle has normal systolic function.The right ventricular size is normal. No increase in right ventricular wall thickness.  4. Left atrial size was normal.  5. Right atrial size was normal.  6. The mitral valve is normal in structure. Mild mitral valve regurgitation.  7. The tricuspid valve is not well visualized.  8. The aortic valve is grossly normal. Aortic valve regurgitation is not visualized.  Mild aortic valve sclerosis without stenosis.  9. The pulmonic valve was normal in structure. Pulmonic valve regurgitation is not visualized. 10. Mildly elevated pulmonary artery systolic pressure. FINDINGS  Left Ventricle: Left ventricular ejection fraction, by visual estimation, is 50 to 55%. The left ventricle has normal function. The left ventricle has no regional wall motion abnormalities. Normal left ventricular posterior wall thickness. There is no left ventricular hypertrophy. Right Ventricle: The right ventricular size is normal. No increase in right ventricular wall thickness. Global RV systolic function is has normal systolic function. The tricuspid regurgitant velocity is 2.36 m/s, and with an assumed right atrial pressure  of 10 mmHg, the estimated right ventricular systolic pressure is mildly elevated at 32.3 mmHg. Left Atrium: Left atrial size was normal in size. Right Atrium: Right atrial size was normal in size Pericardium: There is no evidence of pericardial effusion. Mitral Valve: The mitral valve is normal in structure. Mild mitral valve regurgitation. Tricuspid Valve: The tricuspid valve is not well visualized. Tricuspid valve regurgitation is  trivial. Aortic Valve: The aortic valve is grossly normal. Aortic valve regurgitation is not visualized. Mild aortic valve sclerosis is present, with no evidence of aortic valve stenosis. Aortic valve mean gradient measures 11.3 mmHg. Aortic valve peak gradient measures 20.8 mmHg. Aortic valve area, by VTI measures 1.19 cm. Pulmonic Valve: The pulmonic valve was normal in structure. Pulmonic valve regurgitation is not visualized. Pulmonic regurgitation is not visualized. Aorta: The aortic root is normal in size and structure. IAS/Shunts: No atrial level shunt detected by color flow Doppler.  LEFT VENTRICLE PLAX 2D LVIDd:         4.05 cm LVIDs:         2.98 cm LV PW:         1.25 cm LV IVS:        1.80 cm LVOT diam:     2.00 cm LV SV:         38 ml LV SV Index:   17.76 LVOT Area:     3.14 cm  RIGHT VENTRICLE RV Basal diam:  3.73 cm RV S prime:     12.40 cm/s TAPSE (M-mode): 3.4 cm LEFT ATRIUM             Index       RIGHT ATRIUM           Index LA diam:        4.00 cm 1.98 cm/m  RA Area:     18.30 cm LA Vol (A2C):   56.6 ml 27.95 ml/m RA Volume:   52.10 ml  25.73 ml/m LA Vol (A4C):   68.0 ml 33.58 ml/m LA Biplane Vol: 65.6 ml 32.39 ml/m  AORTIC VALVE                    PULMONIC VALVE AV Area (Vmax):    1.12 cm     PV Vmax:        0.98 m/s AV Area (Vmean):   1.18 cm     PV Peak grad:   3.8 mmHg AV Area (VTI):     1.19 cm     RVOT Peak grad: 6 mmHg AV Vmax:           228.00 cm/s AV Vmean:          152.000 cm/s AV VTI:            0.448 m AV Peak Grad:      20.8 mmHg AV Mean  Grad:      11.3 mmHg LVOT Vmax:         81.30 cm/s LVOT Vmean:        57.000 cm/s LVOT VTI:          0.170 m LVOT/AV VTI ratio: 0.38  AORTA Ao Root diam: 2.90 cm MITRAL VALVE                       TRICUSPID VALVE MV Area (PHT): 1.12 cm            TR Peak grad:   22.3 mmHg MV PHT:        196.62 msec         TR Vmax:        236.00 cm/s MV Decel Time: 678 msec MV E velocity: 96.25 cm/s 103 cm/s SHUNTS                                     Systemic VTI:  0.17 m                                    Systemic Diam: 2.00 cm  Yolonda Kida MD Electronically signed by Yolonda Kida MD Signature Date/Time: 12/15/2019/11:31:19 AM    Final       Labs: BNP (last 3 results) Recent Labs    12/14/19 0752 12/16/19 0633 12/21/19 1543  BNP 364.0* 174.0* 914.7*   Basic Metabolic Panel: Recent Labs  Lab 12/28/19 1522 12/28/19 2217 12/29/19 0440 12/30/19 0455  NA 137 138 140 139  K 5.2* 4.5 4.5 4.2  CL 105 107 110 109  CO2 22 21* 19* 22  GLUCOSE 249* 270* 248* 88  BUN 42* 41* 42* 32*  CREATININE 1.98* 1.74* 1.67* 1.48*  CALCIUM 9.2 9.1 8.9 8.6*   Liver Function Tests: Recent Labs  Lab 12/28/19 1522  AST 14*  ALT 15  ALKPHOS 86  BILITOT 0.6  PROT 7.3  ALBUMIN 4.2   No results for input(s): LIPASE, AMYLASE in the last 168 hours. No results for input(s): AMMONIA in the last 168 hours. CBC: Recent Labs  Lab 12/28/19 1522 12/28/19 2217 12/29/19 0440 12/30/19 0455  WBC 7.6 8.9 10.9* 9.1  NEUTROABS 5.3  --   --   --   HGB 11.7* 11.7* 11.1* 11.1*  HCT 37.2* 36.3* 34.7* 35.3*  MCV 94.2 93.8 93.3 94.9  PLT 279 292 270 266   Cardiac Enzymes: No results for input(s): CKTOTAL, CKMB, CKMBINDEX, TROPONINI in the last 168 hours. BNP: Invalid input(s): POCBNP CBG: Recent Labs  Lab 12/29/19 2246 12/30/19 0440 12/30/19 0607 12/30/19 0843 12/30/19 1132  GLUCAP 224* 68* 95 109* 199*   D-Dimer No results for input(s): DDIMER in the last 72 hours. Hgb A1c No results for input(s): HGBA1C in the last 72 hours. Lipid Profile No results for input(s): CHOL, HDL, LDLCALC, TRIG, CHOLHDL, LDLDIRECT in the last 72 hours. Thyroid function studies No results for input(s): TSH, T4TOTAL, T3FREE, THYROIDAB in the last 72 hours.  Invalid input(s): FREET3 Anemia work up No results for input(s): VITAMINB12, FOLATE, FERRITIN, TIBC, IRON, RETICCTPCT in the last 72 hours. Urinalysis    Component Value Date/Time   COLORURINE  YELLOW (A) 12/11/2018 1512   APPEARANCEUR CLEAR (A) 12/11/2018 1512   LABSPEC 1.013 12/11/2018 1512   PHURINE  5.0 12/11/2018 1512   GLUCOSEU NEGATIVE 12/11/2018 1512   HGBUR NEGATIVE 12/11/2018 1512   [place] 12/11/2018 1512   KETONESUR NEGATIVE 12/11/2018 1512   PROTEINUR NEGATIVE 12/11/2018 1512   NITRITE NEGATIVE 12/11/2018 1512   LEUKOCYTESUR NEGATIVE 12/11/2018 1512   Sepsis Labs Invalid input(s): PROCALCITONIN,  WBC,  LACTICIDVEN Microbiology Recent Results (from the past 240 hour(s))  Respiratory Panel by RT PCR (Flu A&B, Covid) - Nasopharyngeal Swab     Status: None   Collection Time: 12/29/19  1:34 AM   Specimen: Nasopharyngeal Swab  Result Value Ref Range Status   SARS Coronavirus 2 by RT PCR NEGATIVE NEGATIVE Final    Comment: (NOTE) SARS-CoV-2 target nucleic acids are NOT DETECTED. The SARS-CoV-2 RNA is generally detectable in upper respiratoy specimens during the acute phase of infection. The lowest concentration of SARS-CoV-2 viral copies this assay can detect is 131 copies/mL. A negative result does not preclude SARS-Cov-2 infection and should not be used as the sole basis for treatment or other patient management decisions. A negative result may occur with  improper specimen collection/handling, submission of specimen other than nasopharyngeal swab, presence of viral mutation(s) within the areas targeted by this assay, and inadequate number of viral copies (<131 copies/mL). A negative result must be combined with clinical observations, patient history, and epidemiological information. The expected result is Negative. Fact Sheet for Patients:  PinkCheek.be Fact Sheet for Healthcare Providers:  GravelBags.it This test is not yet ap proved or cleared by the [place] FDA and  has been authorized for detection and/or diagnosis of SARS-CoV-2 by FDA under an Emergency Use Authorization (EUA).  This EUA will remain  in effect (meaning this test can be used) for the duration of the COVID-19 declaration under Section 564(b)(1) of the Act, 21 U.S.C. section 360bbb-3(b)(1), unless the authorization is terminated or revoked sooner.    Influenza A by PCR NEGATIVE NEGATIVE Final   Influenza B by PCR NEGATIVE NEGATIVE Final    Comment: (NOTE) The Xpert Xpress SARS-CoV-2/FLU/RSV assay is intended as an aid in  the diagnosis of influenza from Nasopharyngeal swab specimens and  should not be used as a sole basis for treatment. Nasal washings and  aspirates are unacceptable for Xpert Xpress SARS-CoV-2/FLU/RSV  testing. Fact Sheet for Patients: PinkCheek.be Fact Sheet for Healthcare Providers: GravelBags.it This test is not yet approved or cleared by the [place] FDA and  has been authorized for detection and/or diagnosis of SARS-CoV-2 by  FDA under an Emergency Use Authorization (EUA). This EUA will remain  in effect (meaning this test can be used) for the duration of the  Covid-19 declaration under Section 564(b)(1) of the Act, 21  U.S.C. section 360bbb-3(b)(1), unless the authorization is  terminated or revoked. Performed at [facility], [address] [place], [place] [address]      Total time spend on discharging this patient, including the last patient exam, discussing the hospital stay, instructions for ongoing care as it relates to all pertinent caregivers, as well as preparing the medical discharge records, prescriptions, and/or referrals as applicable, is 35 minutes.    Enzo Bi, MD  [facility] 01/01/2020, 5:15 PM  If 7PM-7AM, please contact night-coverage

## 2019-12-30 NOTE — Progress Notes (Signed)
Inpatient Diabetes Program Recommendations  AACE/ADA: New Consensus Statement on Inpatient Glycemic Control   Target Ranges:  Prepandial:   less than 140 mg/dL      Peak postprandial:   less than 180 mg/dL (1-2 hours)      Critically ill patients:  140 - 180 mg/dL  Results for Felty, Mandell [name] "[name]" (MRN [id number]) as of 12/30/2019 13:59  Ref. Range 12/30/2019 04:40 12/30/2019 06:07 12/30/2019 08:43 12/30/2019 11:32  Glucose-Capillary Latest Ref Range: 70 - 99 mg/dL 68 (L) 95 109 (H) 199 (H)   Results for Ahr, Tyreon [name] "[name]" (MRN [id number]) as of 12/30/2019 13:59  Ref. Range 12/29/2019 08:01 12/29/2019 11:50 12/29/2019 18:10 12/29/2019 20:38 12/29/2019 22:46  Glucose-Capillary Latest Ref Range: 70 - 99 mg/dL 176 (H) 181 (H) 251 (H) 255 (H) 224 (H)   Review of Glycemic Control  Diabetes history: DM2 Outpatient Diabetes medications: Lantus 45 units QHS Current orders for Inpatient glycemic control: Lantus 45 units QHS, Novolog 0-9 units Q4H  Inpatient Diabetes Program Recommendations:    Insulin-Basal: Noted glucose 68 mg/dl at 4:40 am today. Please consider decreasing Lantus to 40 units QHS.  Thanks, Barnie Alderman, RN, MSN, CDE Diabetes Coordinator Inpatient Diabetes Program [phone] (Team Pager from 8am to 5pm)

## 2019-12-30 NOTE — Plan of Care (Signed)
  Problem: Health Behavior/Discharge Planning: Goal: Ability to manage health-related needs will improve Outcome: Progressing   

## 2019-12-30 NOTE — Progress Notes (Signed)
Patient Name: Mike Decker Date of Encounter: 12/30/2019  Hospital Problem List     Active Problems:   NSTEMI (non-ST elevated myocardial infarction) ([facility])   Lobar pneumonia [facility])    Patient Profile     [name] is a 79 year old male with a past medical history significant for coronary artery disease s/p PCI to RCA, history of a CVA, chronic kidney disease, peripheral vascular disease, type 2 diabetes, COPD, hypertension, hyperlipidemia, and GERD who presented to the ED on 12/28/19 for an acute onset of mid-sternal chest pain with associated shortness of breath, radiating to his left arm and jaw.  While at home, he took 3 sublingual nitroglycerin tablets which did not relieve his symptoms. Workup in the ED included high sensitivity troponin elevated x 3 at 23, 373, and 2426 respectively, chest xray negative for acute cardiopulmonary disease, ECG revealing sinus rhythm with PVCs, and initial creatinine of 1.74.  Underwent PCI of the proximal to mid left circumflex with placement of a Resolute Onyx 2.5 x 15 mm drug-eluting stent postdilated to 2.8 mm with 0 residual.  Cath also present revealed mild to moderate disease in the LAD and RCA with a patent stent in the RCA.  Subjective   Doing well this AM.  Mild tenderness in the right femoral cath attempt site and right radial cath site.  No bleeding or hematoma.  Distal pulses appear intact.  No chest pain.  Inpatient Medications    . amLODipine  5 mg Oral Daily  . vitamin C  250 mg Oral Daily  . aspirin EC  325 mg Oral Daily  . azithromycin  250 mg Oral Daily  . citalopram  10 mg Oral Daily  . clopidogrel  75 mg Oral Daily  . ferrous sulfate  325 mg Oral Daily  . fluticasone  2 spray Each Nare BID  . folic acid  662 mcg Oral Daily  . furosemide  40 mg Oral Daily  . heparin  5,000 Units Subcutaneous Q8H  . hydrALAZINE  25 mg Oral Q8H  . insulin aspart  0-9 Units Subcutaneous Q4H  . insulin glargine  45 Units Subcutaneous QHS   . isosorbide mononitrate  60 mg Oral Daily  . loratadine  10 mg Oral Daily  . magnesium oxide  200 mg Oral Daily  . methocarbamol  500 mg Oral BID  . metoprolol tartrate  75 mg Oral BID  . montelukast  5 mg Oral QHS  . omega-3 acid ethyl esters  1 g Oral Daily  . pantoprazole  40 mg Oral BID AC  . rosuvastatin  40 mg Oral QPM  . sacubitril-valsartan  1 tablet Oral BID  . sodium chloride flush  3 mL Intravenous Q12H  . sodium chloride flush  3 mL Intravenous Q12H  . [START ON 01/02/2020] Vitamin D (Ergocalciferol)  50,000 Units Oral Q30 days  . zinc sulfate  220 mg Oral Daily    Vital Signs    Vitals:   12/29/19 1615 12/29/19 1814 12/29/19 1937 12/30/19 0438  BP: (!) 128/49 (!) 155/62 (!) 136/48 (!) 150/50  Pulse: 65 61 62 (!) 54  Resp: 16 18 19 19   Temp:  97.8 F (36.6 C) 98.1 F (36.7 C) 98.3 F (36.8 C)  TempSrc:  Oral Oral Oral  SpO2: 96% 99% 98% 97%  Weight:    89.6 kg  Height:        Intake/Output Summary (Last 24 hours) at 12/30/2019 0710 Last data filed at 12/30/2019  3151 Gross per 24 hour  Intake 60 ml  Output 2575 ml  Net -2515 ml   Filed Weights   12/28/19 2213 12/29/19 0518 12/30/19 0438  Weight: 89.3 kg 89.5 kg 89.6 kg    Physical Exam    GEN: Well nourished, well developed, in no acute distress.  HEENT: normal.  Neck: Supple, no JVD, carotid bruits, or masses. Cardiac: RRR, no murmurs, rubs, or gallops. No clubbing, cyanosis, edema.  Radials/DP/PT 2+ and equal bilaterally.  Respiratory:  Respirations regular and unlabored, clear to auscultation bilaterally. GI: Soft, nontender, nondistended, BS + x 4. MS: no deformity or atrophy. Skin: warm and dry, no rash. Neuro:  Strength and sensation are intact. Psych: Normal affect.  Labs    CBC Recent Labs    12/28/19 1522 12/28/19 2217 12/29/19 0440 12/30/19 0455  WBC 7.6   < > 10.9* 9.1  NEUTROABS 5.3  --   --   --   HGB 11.7*   < > 11.1* 11.1*  HCT 37.2*   < > 34.7* 35.3*  MCV 94.2   < >  93.3 94.9  PLT 279   < > 270 266   < > = values in this interval not displayed.   Basic Metabolic Panel Recent Labs    12/29/19 0440 12/30/19 0455  NA 140 139  K 4.5 4.2  CL 110 109  CO2 19* 22  GLUCOSE 248* 88  BUN 42* 32*  CREATININE 1.67* 1.48*  CALCIUM 8.9 8.6*   Liver Function Tests Recent Labs    12/28/19 1522  AST 14*  ALT 15  ALKPHOS 86  BILITOT 0.6  PROT 7.3  ALBUMIN 4.2   No results for input(s): LIPASE, AMYLASE in the last 72 hours. Cardiac Enzymes No results for input(s): CKTOTAL, CKMB, CKMBINDEX, TROPONINI in the last 72 hours. BNP No results for input(s): BNP in the last 72 hours. D-Dimer No results for input(s): DDIMER in the last 72 hours. Hemoglobin A1C No results for input(s): HGBA1C in the last 72 hours. Fasting Lipid Panel Recent Labs    12/29/19 0440  CHOL 115  HDL 31*  LDLCALC 51  TRIG 163*  CHOLHDL 3.7   Thyroid Function Tests No results for input(s): TSH, T4TOTAL, T3FREE, THYROIDAB in the last 72 hours.  Invalid input(s): FREET3  Telemetry    Sinus rhythm with no ischemia  ECG    Sinus rhythm with no ischemia  Radiology    CT ABDOMEN PELVIS WO CONTRAST  Result Date: 12/16/2019 CLINICAL DATA:  Diverticulitis suspected. Left upper quadrant and around movement EXAM: CT ABDOMEN AND PELVIS WITHOUT CONTRAST TECHNIQUE: Multidetector CT imaging of the abdomen and pelvis was performed following the standard protocol without IV contrast. COMPARISON:  05/07/2017 FINDINGS: Lower chest:  No contributory findings. Hepatobiliary: Subtle low-density lesion on the upper right liver, hemangioma based on previous imaging/reporting.No evidence of biliary obstruction or stone. Pancreas: Unremarkable. Spleen: Unremarkable. Adrenals/Urinary Tract: Negative adrenals. No hydronephrosis or stone. Unremarkable bladder. Stomach/Bowel: No obstruction. No appendicitis. Sigmoid diverticulosis. No diverticulitis noted. Vascular/Lymphatic: No acute vascular  abnormality. 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 No mass or adenopathy. Reproductive:Prostatectomy without worrisome nodularity. Other: No ascites or pneumoperitoneum. Fatty right inguinal hernia. Left inguinal hernia repair. Musculoskeletal: No acute abnormalities. IMPRESSION: 1. No acute finding including evidence of diverticulitis 2. Fatty right inguinal hernia. 3. Left colonic diverticulosis. Electronically Signed   By: Monte Fantasia M.D.   On: 12/16/2019 08:00   DG Chest 2  View  Result Date: 12/28/2019 CLINICAL DATA:  Chest pain. EXAM: CHEST - 2 VIEW COMPARISON:  Radiograph 12/21/2019, CT 12/11/2018 FINDINGS: The cardiomediastinal contours are normal. Bilateral subpleural opacities have improved from prior exam. Pulmonary vasculature is normal. No consolidation, pleural effusion, or pneumothorax. No acute osseous abnormalities are seen. IMPRESSION: No acute chest findings. Improving bilateral subpleural opacities. Electronically Signed   By: Keith Rake M.D.   On: 12/28/2019 22:45   DG Chest 2 View  Result Date: 12/21/2019 CLINICAL DATA:  79 year old male with shortness of breath and left-sided chest pain. EXAM: CHEST - 2 VIEW COMPARISON:  Chest radiograph dated 12/16/2019. FINDINGS: Minimal bilateral subpleural hazy densities may represent atelectasis. Atypical infection is not excluded. Clinical correlation is recommended. No lobar consolidation, pleural effusion, or pneumothorax. Stable cardiac silhouette. No acute osseous pathology. IMPRESSION: Bilateral subpleural atelectasis. Atypical infection is not excluded. Clinical correlation is recommended. No focal consolidation. Electronically Signed   By: Anner Crete M.D.   On: 12/21/2019 21:30   DG Chest 2 View  Result Date: 12/14/2019 CLINICAL DATA:  Chest pain EXAM: CHEST - 2 VIEW COMPARISON:  11/24/2019, 06/10/2019 FINDINGS: No consolidation or pleural effusion. Mild coarse  bronchitic changes. Stable cardiomediastinal silhouette with borderline cardiomegaly. No pneumothorax. IMPRESSION: Low lung volumes with coarse bronchitic changes. No consolidative airspace disease. Borderline to mild cardiomegaly. Electronically Signed   By: Donavan Foil M.D.   On: 12/14/2019 03:18   CARDIAC CATHETERIZATION  Result Date: 12/29/2019 Conclusions: 1. Multivessel coronary artery disease, including diffuse mid LAD disease of up to 50% and 70-80% apical LAD stenosis, 80-90% focal proximal LCx lesion, 50% OM1 stenosis, and 40% mid and distal RCA lesions. 2. Widely patent proximal RCA stent. 3. Mildly elevated left ventricular filling pressure. 4. Successful PCI to proximal LCx using Resolute Onyx 2.5 x 15 mm drug-eluting stent (postdilated to 2.8 mm) with 0% residual stenosis and TIMI-3 flow. Recommendations: 1. Continue dual antiplatelet therapy with aspirin and clopidogrel for at least 12 months, ideally longer. 2. Aggressive secondary prevention and medical management of residual coronary artery disease that appears stable compared with most recent catheterization in 2019. Nelva Bush, MD [facility]   US RENAL  Result Date: 12/16/2019 CLINICAL DATA:  Left upper quadrant pain.  History of renal stone. EXAM: RENAL / URINARY TRACT ULTRASOUND COMPLETE COMPARISON:  Abdominal CT from earlier the same day FINDINGS: Right Kidney: Renal measurements: 10.7 x 4.8 x 4.8 cm = volume: 130 mL. No hydronephrosis or mass. Two small cystic structures measuring 12 and 6 mm. Left Kidney: Renal measurements: 11.5 x 5 x 4.7 cm = volume: 141 mL. Echogenicity within normal limits. No mass or hydronephrosis visualized. Bladder: Appears normal for degree of bladder distention. IMPRESSION: Essentially negative renal ultrasound. No renal calculi were seen on preceding abdominal CT. Electronically Signed   By: Monte Fantasia M.D.   On: 12/16/2019 09:02   DG Chest Port 1 View  Result Date: 12/16/2019 CLINICAL  DATA:  Dyspnea, short of breath with exertion, history of prostate esophageal cancer, CHF, diabetes mellitus, hypertension, MI EXAM: PORTABLE CHEST 1 VIEW COMPARISON:  Portable exam 1009 hours compared to 12/14/2019 FINDINGS: Borderline enlargement of cardiac silhouette. Mediastinal contours and pulmonary vascularity normal. Lungs clear. No pulmonary infiltrate, pleural effusion or pneumothorax. Osseous structures unremarkable. IMPRESSION: No acute abnormalities. Electronically Signed   By: Lavonia Dana M.D.   On: 12/16/2019 10:28   ECHOCARDIOGRAM COMPLETE  Result Date: 12/29/2019   ECHOCARDIOGRAM REPORT   Patient Name:   Mike Decker  Date of Exam: 12/29/2019 Medical Rec #:  [id number]            Height:       66.0 in Accession #:    [id number]           Weight:       197.4 lb Date of Birth:  10-11-1941            BSA:          1.99 m Patient Age:    49 years             BP:           183/64 mmHg Patient Gender: M                    HR:           51 bpm. Exam Location:  [facility] Procedure: 2D Echo, Color Doppler and Cardiac Doppler Indications:     I21.4 NSTEMI  History:         Patient has prior history of Echocardiogram examinations, most                  recent 12/14/2019. CHF, PVD; Risk Factors:Hypertension, Diabetes                  and Dyslipidemia.  Sonographer:     Charmayne Sheer RDCS (AE) Referring Phys:  [id number] Arvella Merles [place] Diagnosing Phys: Bartholome Bill MD  Sonographer Comments: Suboptimal subcostal window. IMPRESSIONS  1. Left ventricular ejection fraction, by visual estimation, is 60 to 65%. The left ventricle has normal function. Left ventricular septal wall thickness was mildly increased. Mildly increased left ventricular posterior wall thickness. There is mildly increased left ventricular hypertrophy.  2. The left ventricle has no regional wall motion abnormalities.  3. Global right ventricle has normal systolic function.The right ventricular size is normal. No increase in right ventricular wall  thickness.  4. Left atrial size was mildly dilated.  5. Right atrial size was normal.  6. The mitral valve is grossly normal. No evidence of mitral valve regurgitation.  7. The tricuspid valve is not well visualized.  8. The tricuspid valve is not well visualized. Tricuspid valve regurgitation is trivial.  9. The aortic valve is tricuspid. Aortic valve regurgitation is trivial. Mild aortic valve sclerosis without stenosis. 10. The pulmonic valve was not well visualized. Pulmonic valve regurgitation is trivial. 11. The aortic root was not well visualized. 12. The interatrial septum was not assessed. FINDINGS  Left Ventricle: Left ventricular ejection fraction, by visual estimation, is 60 to 65%. The left ventricle has normal function. The left ventricle has no regional wall motion abnormalities. Mildly increased left ventricular posterior wall thickness. There is mildly increased left ventricular hypertrophy. Right Ventricle: The right ventricular size is normal. No increase in right ventricular wall thickness. Global RV systolic function is has normal systolic function. Left Atrium: Left atrial size was mildly dilated. Right Atrium: Right atrial size was normal in size Pericardium: There is no evidence of pericardial effusion. Mitral Valve: The mitral valve is grossly normal. No evidence of mitral valve regurgitation. MV peak gradient, 4.9 mmHg. Tricuspid Valve: The tricuspid valve is not well visualized. Tricuspid valve regurgitation is trivial. Aortic Valve: The aortic valve is tricuspid. Aortic valve regurgitation is trivial. Mild aortic valve sclerosis is present, with no evidence of aortic valve stenosis. Aortic valve mean gradient measures 10.0 mmHg. Aortic valve peak gradient measures 19.1  mmHg. Aortic valve area, by  VTI measures 1.73 cm. Pulmonic Valve: The pulmonic valve was not well visualized. Pulmonic valve regurgitation is trivial. Pulmonic regurgitation is trivial. Aorta: The aortic root was not well  visualized. IAS/Shunts: The interatrial septum was not assessed.  LEFT VENTRICLE PLAX 2D LVIDd:         3.98 cm  Diastology LVIDs:         2.61 cm  LV e' lateral:   7.29 cm/s LV PW:         1.08 cm  LV E/e' lateral: 10.9 LV IVS:        1.05 cm  LV e' medial:    6.20 cm/s LVOT diam:     2.30 cm  LV E/e' medial:  12.8 LV SV:         44 ml LV SV Index:   21.39 LVOT Area:     4.15 cm  RIGHT VENTRICLE RV Basal diam:  3.27 cm LEFT ATRIUM             Index       RIGHT ATRIUM          Index LA diam:        4.70 cm 2.36 cm/m  RA Area:     8.89 cm LA Vol (A2C):   61.4 ml 30.87 ml/m RA Volume:   15.50 ml 7.79 ml/m LA Vol (A4C):   71.3 ml 35.85 ml/m LA Biplane Vol: 66.6 ml 33.49 ml/m  AORTIC VALVE                    PULMONIC VALVE AV Area (Vmax):    1.72 cm     PV Vmax:       1.32 m/s AV Area (Vmean):   1.86 cm     PV Vmean:      84.200 cm/s AV Area (VTI):     1.73 cm     PV VTI:        0.258 m AV Vmax:           218.50 cm/s  PV Peak grad:  7.0 mmHg AV Vmean:          149.500 cm/s PV Mean grad:  3.0 mmHg AV VTI:            0.458 m AV Peak Grad:      19.1 mmHg AV Mean Grad:      10.0 mmHg LVOT Vmax:         90.20 cm/s LVOT Vmean:        67.000 cm/s LVOT VTI:          0.191 m LVOT/AV VTI ratio: 0.42  AORTA Ao Root diam: 2.80 cm MITRAL VALVE MV Area (PHT): 2.66 cm             SHUNTS MV Peak grad:  4.9 mmHg             Systemic VTI:  0.19 m MV Mean grad:  1.0 mmHg             Systemic Diam: 2.30 cm MV Vmax:       1.11 m/s MV Vmean:      52.2 cm/s MV VTI:        0.38 m MV PHT:        82.65 msec MV Decel Time: 285 msec MV E velocity: 79.50 cm/s 103 cm/s MV A velocity: 73.70 cm/s 70.3 cm/s MV E/A ratio:  1.08       1.5  Chrissie Noa  Natsuko Kelsay MD Electronically signed by Bartholome Bill MD Signature Date/Time: 12/29/2019/1:04:34 PM    Final    ECHOCARDIOGRAM COMPLETE  Result Date: 12/15/2019   ECHOCARDIOGRAM REPORT   Patient Name:   Mike Decker Date of Exam: 12/14/2019 Medical Rec #:  [id number]        Height:       66.0 in Accession  #:    [id number]       Weight:       206.0 lb Date of Birth:  1941/08/30        BSA:          2.03 m Patient Age:    27 years         BP:           130/57 mmHg Patient Gender: M                HR:           68 bpm. Exam Location:  [facility] Procedure: 2D Echo, Color Doppler and Cardiac Doppler Indications:     Chest pain 786.50  History:         Patient has prior history of Echocardiogram examinations, most                  recent 11/15/2018. Risk Factors:Sleep Apnea, Hypertension and                  Diabetes. Mi, PVD.  Sonographer:     Sherrie Sport RDCS (AE) Referring Phys:  [id number] Soledad Gerlach NIU Diagnosing Phys: Yolonda Kida MD  Sonographer Comments: Suboptimal parasternal window. IMPRESSIONS  1. Left ventricular ejection fraction, by visual estimation, is 50 to 55%. The left ventricle has normal function. Normal left ventricular posterior wall thickness. There is no left ventricular hypertrophy.  2. The left ventricle has no regional wall motion abnormalities.  3. Global right ventricle has normal systolic function.The right ventricular size is normal. No increase in right ventricular wall thickness.  4. Left atrial size was normal.  5. Right atrial size was normal.  6. The mitral valve is normal in structure. Mild mitral valve regurgitation.  7. The tricuspid valve is not well visualized.  8. The aortic valve is grossly normal. Aortic valve regurgitation is not visualized. Mild aortic valve sclerosis without stenosis.  9. The pulmonic valve was normal in structure. Pulmonic valve regurgitation is not visualized. 10. Mildly elevated pulmonary artery systolic pressure. FINDINGS  Left Ventricle: Left ventricular ejection fraction, by visual estimation, is 50 to 55%. The left ventricle has normal function. The left ventricle has no regional wall motion abnormalities. Normal left ventricular posterior wall thickness. There is no left ventricular hypertrophy. Right Ventricle: The right ventricular size is normal. No increase  in right ventricular wall thickness. Global RV systolic function is has normal systolic function. The tricuspid regurgitant velocity is 2.36 m/s, and with an assumed right atrial pressure  of 10 mmHg, the estimated right ventricular systolic pressure is mildly elevated at 32.3 mmHg. Left Atrium: Left atrial size was normal in size. Right Atrium: Right atrial size was normal in size Pericardium: There is no evidence of pericardial effusion. Mitral Valve: The mitral valve is normal in structure. Mild mitral valve regurgitation. Tricuspid Valve: The tricuspid valve is not well visualized. Tricuspid valve regurgitation is trivial. Aortic Valve: The aortic valve is grossly normal. Aortic valve regurgitation is not visualized. Mild aortic valve sclerosis is present, with no evidence of aortic valve stenosis. Aortic valve mean  gradient measures 11.3 mmHg. Aortic valve peak gradient measures 20.8 mmHg. Aortic valve area, by VTI measures 1.19 cm. Pulmonic Valve: The pulmonic valve was normal in structure. Pulmonic valve regurgitation is not visualized. Pulmonic regurgitation is not visualized. Aorta: The aortic root is normal in size and structure. IAS/Shunts: No atrial level shunt detected by color flow Doppler.  LEFT VENTRICLE PLAX 2D LVIDd:         4.05 cm LVIDs:         2.98 cm LV PW:         1.25 cm LV IVS:        1.80 cm LVOT diam:     2.00 cm LV SV:         38 ml LV SV Index:   17.76 LVOT Area:     3.14 cm  RIGHT VENTRICLE RV Basal diam:  3.73 cm RV S prime:     12.40 cm/s TAPSE (M-mode): 3.4 cm LEFT ATRIUM             Index       RIGHT ATRIUM           Index LA diam:        4.00 cm 1.98 cm/m  RA Area:     18.30 cm LA Vol (A2C):   56.6 ml 27.95 ml/m RA Volume:   52.10 ml  25.73 ml/m LA Vol (A4C):   68.0 ml 33.58 ml/m LA Biplane Vol: 65.6 ml 32.39 ml/m  AORTIC VALVE                    PULMONIC VALVE AV Area (Vmax):    1.12 cm     PV Vmax:        0.98 m/s AV Area (Vmean):   1.18 cm     PV Peak grad:   3.8 mmHg  AV Area (VTI):     1.19 cm     RVOT Peak grad: 6 mmHg AV Vmax:           228.00 cm/s AV Vmean:          152.000 cm/s AV VTI:            0.448 m AV Peak Grad:      20.8 mmHg AV Mean Grad:      11.3 mmHg LVOT Vmax:         81.30 cm/s LVOT Vmean:        57.000 cm/s LVOT VTI:          0.170 m LVOT/AV VTI ratio: 0.38  AORTA Ao Root diam: 2.90 cm MITRAL VALVE                       TRICUSPID VALVE MV Area (PHT): 1.12 cm            TR Peak grad:   22.3 mmHg MV PHT:        196.62 msec         TR Vmax:        236.00 cm/s MV Decel Time: 678 msec MV E velocity: 96.25 cm/s 103 cm/s SHUNTS                                    Systemic VTI:  0.17 m  Systemic Diam: 2.00 cm  Yolonda Kida MD Electronically signed by Yolonda Kida MD Signature Date/Time: 12/15/2019/11:31:19 AM    Final     Assessment & Plan    1.  Chest pain, elevated troponin              -With history of CAD, ongoing chest pain, and elevated troponin, underwent a left cardiac catheterization which revealed mild to moderate disease in the LAD and RCA with a patent stent in the RCA.  Patient had a 95% stenosis in the left circumflex.  Underwent placement of a resolute Onyx 2.5 x 15 mm drug-eluting stent in the left circumflex.  He is doing well post stent.  Has been on aspirin and Plavix prior to admission.  We will continue with aspirin and Plavix at 81 and 75 mg respectively, rosuvastatin at 40 mg daily, metoprolol tartrate 75 mg twice daily, isosorbide mononitrate at 60 mg daily.  Phase 2 cardiac rehab has been ordered.   Patient appears stable and ready for discharge on medication as listed below.  Phase 2 cardiac rehab  rehab has been ordered.  Follow-up with Dr. Clayborn Bigness in 1 week.  .2.  History of atrial fibrillation              -Currently not on anticoagulation; on aspirin and Plavix              -In sinus rhythm at present; continue metoprolol 75mg  BID   3.  Chronic HFrEF              -Appears euvolemic  on exam with no evidence of vascular congestion on chest xray              -Continue home medication regimen of Entresto at 24-26 mg twice daily, Lasix   at 40 mg p.o. daily.  Low-sodium diet and daily weights are recommended.  4.  Hypertension   -Continue with hydralazine 25 mg every 8 hours, Entresto 24-26 daily, amlodipine 5 mg daily, metoprolol tartrate 75 mg twice daily, Imdur 60 mg daily and low-sodium diet.  We will continue to monitor blood pressure as an outpatient.  Will make further adjustments based on outpatient blood pressures.  Signed, Javier Docker Billi Bright MD 12/30/2019, 7:10 AM  Pager: [phone]

## 2020-01-03 NOTE — Progress Notes (Signed)
Patient ID: Mike Decker, male    DOB: 07/03/1941, 79 y.o.   MRN: [id number]  HPI  Mike Decker is a 79 y/o male with a history of DM, anemia, hyperlipidemia, HTN, obstructive sleep apnea, PVD, previous tobacco use and chronic heart failure.   Echo report from 12/29/19 reviewed and showed an EF of 60-65% along with trivial TR/PR. Echo report from 11/15/18 reviewed and showed an EF of 55-60% along with mild AS and mild/moderate Mike.   Cardiac catheterization done 12/29/19 showed: 1. Multivessel coronary artery disease, including diffuse mid LAD disease of up to 50% and 70-80% apical LAD stenosis, 80-90% focal proximal LCx lesion, 50% OM1 stenosis, and 40% mid and distal RCA lesions. 2. Widely patent proximal RCA stent. 3. Mildly elevated left ventricular filling pressure. 4. Successful PCI to proximal LCx using Resolute Onyx 2.5 x 15 mm drug-eluting stent (postdilated to 2.8 mm) with 0% residual stenosis and TIMI-3 flow.  Cardiac catheterization done 12/08/18 showed:   Successful cardiac cath with mild to moderate diffuse coronary disease throughout No significant high-grade obstructive coronary disease Patient appears to have possible microvascular disease LV gram was deferred because of renal insufficiency  Admitted 12/28/19 due to NSTEMI. Cardiology consult obtained. Patient started on heparin and catheterization done. DES placed in left circumflex. Discharged after 2 days. Admitted 12/14/19 due to atypical chest pain and acute on chronic HF. Cardiology consult obtained. Elevated troponin thought to be due to demand ischemia. Initially needed IV lasix and then transitioned to oral diuretics. Discharged after 3 days.   He presents today for a follow-up visit although hasn't been seen in the last year. He presents with a chief complaint of minimal shortness of breath upon moderate exertion. He describes this as chronic in nature having been present for several years. He has associated chronic  cough, weakness and difficulty sleeping along with this. He denies any abdominal distention, palpitations, pedal edema, chest pain, dizziness or fatigue.   Past Medical History:  Diagnosis Date  . Anemia   . Arthritis   . Atrioventricular canal (AVC)    irregular heart beats  . Barrett esophagus   . 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 Chronic diastolic CHF (congestive heart failure) ([place])   . Colon polyp   . Diabetes mellitus without complication ([place])   . Diverticulosis   . Gout   . Heart disease   . Hemangioma    liver  . Hyperlipidemia   . Hypertension   . Myocardial infarct ([place])   . Ocular hypertension   . Peripheral vascular disease ([place])   . Skin cancer   . Sleep apnea   . Vitreoretinal degeneration    Past Surgical History:  Procedure Laterality Date  . CATARACT EXTRACTION  2011, 2012  . COLONOSCOPY N/A 12/14/2018   Procedure: COLONOSCOPY;  Surgeon: Virgel Manifold, MD;  Location: [facility] ENDOSCOPY;  Service: Endoscopy;  Laterality: N/A;  . CORONARY ANGIOPLASTY WITH STENT PLACEMENT  2012  . CORONARY STENT INTERVENTION N/A 12/29/2019   Procedure: CORONARY STENT INTERVENTION;  Surgeon: Nelva Bush, MD;  Location: [place] [facility];  Service: Cardiovascular;  Laterality: N/A;  . ESOPHAGOGASTRODUODENOSCOPY N/A 12/14/2018   Procedure: ESOPHAGOGASTRODUODENOSCOPY (EGD);  Surgeon: Virgel Manifold, MD;  Location: [facility] ENDOSCOPY;  Service: Endoscopy;  Laterality: N/A;  . ESOPHAGOGASTRODUODENOSCOPY (EGD) WITH PROPOFOL N/A 06/01/2016   Procedure: ESOPHAGOGASTRODUODENOSCOPY (EGD) WITH PROPOFOL;  Surgeon: Manya Silvas, MD;  Location: [facility] ENDOSCOPY;  Service: Endoscopy;  Laterality: N/A;  . HERNIA  REPAIR     x2  . INTRAOCULAR LENS INSERTION    . LEFT HEART CATH AND CORONARY ANGIOGRAPHY N/A 05/09/2017   Procedure: Left Heart Cath and Coronary Angiography;  Surgeon: Yolonda Kida, MD;  Location: [place] [facility];  Service: Cardiovascular;  Laterality:  N/A;  . LEFT HEART CATH AND CORONARY ANGIOGRAPHY N/A 12/08/2018   Procedure: LEFT HEART CATH AND CORONARY ANGIOGRAPHY and possible PCI and stent;  Surgeon: Yolonda Kida, MD;  Location: [place] [facility];  Service: Cardiovascular;  Laterality: N/A;  . LEFT HEART CATH AND CORONARY ANGIOGRAPHY N/A 12/29/2019   Procedure: LEFT HEART CATH AND CORONARY ANGIOGRAPHY;  Surgeon: Nelva Bush, MD;  Location: [place] [facility];  Service: Cardiovascular;  Laterality: N/A;  . NOSE SURGERY     submucous resection  . PROSTATE SURGERY  2002  . ROTATOR CUFF REPAIR Right    Family History  Problem Relation Age of Onset  . Arthritis Mother   . Stroke Maternal Grandfather   . Breast cancer Neg Hx    Social History   Tobacco Use  . Smoking status: Former Smoker    Years: 20.00    Types: Cigarettes    Quit date: 12/10/1976    Years since quitting: 43.0  . Smokeless tobacco: Never Used  Substance Use Topics  . Alcohol use: No   Allergies  Allergen Reactions  . Gabapentin     Other reaction(s): Other (See Comments) Tremors  . Peanut-Containing Drug Products Anaphylaxis  . Penicillins Hives and Rash    Has patient had a PCN reaction causing immediate rash, facial/tongue/throat swelling, SOB or lightheadedness with hypotension: Yes Has patient had a PCN reaction causing severe rash involving mucus membranes or skin necrosis: No Has patient had a PCN reaction that required hospitalization No Has patient had a PCN reaction occurring within the last 10 years: No If all of the above answers are "NO", then may proceed with Cephalosporin use.  . Bee Venom Swelling  . Influenza Vaccines Hives  . Inh [Isoniazid] Hives  . Kenalog [Triamcinolone Acetonide] Hives  . Levaquin [Levofloxacin] Other (See Comments)    Tendon, ligament pain.   . Nalfon [Fenoprofen Calcium] Hives  . Naproxen   . Peanut Oil   . Nsaids Rash    Nalfon 600 Nalfon 600   Prior to Admission medications   Medication  Sig Start Date End Date Taking? Authorizing Provider  acetaminophen (TYLENOL) 500 MG tablet Take 1,000 mg by mouth every 6 (six) hours as needed for mild pain, moderate pain or headache.   Yes [provider]  allopurinol (ZYLOPRIM) 100 MG tablet Take 100 mg by mouth daily.    Yes [provider]  amLODipine (NORVASC) 5 MG tablet Take 5 mg by mouth daily.  12/31/18  Yes [provider]  aspirin EC 81 MG EC tablet Take 1 tablet (81 mg total) by mouth daily. 12/18/19  Yes Amin, Jeanella Flattery, MD  budesonide-formoterol (SYMBICORT) 160-4.5 MCG/ACT inhaler Inhale 2 puffs into the lungs 2 (two) times daily as needed.    Yes [provider]  cetirizine (ZYRTEC) 5 MG tablet Take 5 mg by mouth daily as needed for allergies or rhinitis.    Yes [provider]  citalopram (CELEXA) 10 MG tablet Take 10 mg by mouth daily.   Yes [provider]  clopidogrel (PLAVIX) 75 MG tablet Take 1 tablet (75 mg total) by mouth daily. 12/30/19 12/24/20 Yes Enzo Bi, MD  colchicine 0.6 MG tablet  Take 3 tablets (1.8 mg total) by mouth daily as needed (gout flares). 12/30/19  Yes Enzo Bi, MD  dextromethorphan-guaiFENesin [facility] DM) 30-600 MG 12hr tablet Take 1 tablet by mouth 2 (two) times daily as needed for cough.   Yes [provider]  EPINEPHrine (EPI-PEN) 0.3 mg/0.3 mL SOAJ injection Inject into the muscle as needed (anaphylaxis).    Yes [provider]  ferrous sulfate 325 (65 FE) MG EC tablet Take 1 tablet (325 mg total) by mouth daily. 11/10/19  Yes Earlie Server, MD  fluticasone [facility]) 50 MCG/ACT nasal spray Place 2 sprays into both nostrils 2 (two) times daily.  11/04/18  Yes [provider]  folic acid (V-R FOLIC ACID) 546 MCG tablet Take 1 tablet (400 mcg total) by mouth daily. 12/29/19  Yes Earlie Server, MD  furosemide (LASIX) 20 MG tablet Take 2 tablets (40 mg total) by mouth daily. 11/17/18  Yes Bettey Costa, MD  hydrALAZINE (APRESOLINE) 25 MG  tablet Take 1 tablet (25 mg total) by mouth every 8 (eight) hours. 12/17/19  Yes Amin, Ankit Chirag, MD  insulin glargine (LANTUS) 100 UNIT/ML injection Inject 0.35 mLs (35 Units total) into the skin at bedtime. This is a decrease from your previous 45 units nightly. 12/30/19  Yes Enzo Bi, MD  ipratropium-albuterol (DUONEB) 0.5-2.5 (3) MG/3ML SOLN Take 3 mLs by nebulization every 4 (four) hours as needed. 05/15/17  Yes Hillary Bow, MD  isosorbide mononitrate (IMDUR) 60 MG 24 hr tablet Take 60 mg by mouth daily.  01/28/19  Yes [provider]  Magnesium 250 MG TABS Take 250 mg by mouth daily.   Yes [provider]  methocarbamol (ROBAXIN) 500 MG tablet Take 500 mg by mouth 2 (two) times daily.  11/08/18  Yes [provider]  metoprolol tartrate (LOPRESSOR) 50 MG tablet Take 75 mg by mouth 2 (two) times daily.    Yes [provider]  montelukast (SINGULAIR) 10 MG tablet TAKE 1 TABLET BY MOUTH EVERYDAY AT BEDTIME 12/14/19  Yes Scar[place], Audie Clear, NP  nitroGLYCERIN (NITROSTAT) 0.4 MG SL tablet Place 0.4 mg under the tongue every 5 (five) minutes x 3 doses as needed for chest pain.    Yes [provider]  Omega-3 Fatty Acids (FISH OIL) 1000 MG CAPS Take 1 capsule by mouth daily.    Yes [provider]  pantoprazole (PROTONIX) 40 MG tablet Take 1 tablet (40 mg total) by mouth 2 (two) times daily before a meal. 12/17/19  Yes Amin, Ankit Chirag, MD  rosuvastatin (CRESTOR) 40 MG tablet Take 40 mg by mouth every evening.    Yes [provider]  sacubitril-valsartan (ENTRESTO) 24-26 MG Take 1 tablet by mouth 2 (two) times daily. 12/17/19  Yes Amin, Jeanella Flattery, MD  vitamin C (ASCORBIC ACID) 250 MG tablet Take 250 mg by mouth daily.   Yes [provider]  Vitamin D, Ergocalciferol, (DRISDOL) 50000 units CAPS capsule Take 50,000 Units by mouth every 30 (thirty) days.    Yes [provider]  zinc gluconate 50 MG tablet Take 50 mg by mouth  daily.   Yes [provider]     Review of Systems  Constitutional: Negative for appetite change and fatigue.  HENT: Positive for congestion. Negative for postnasal drip and sore throat.   Eyes: Negative.   Respiratory: Positive for cough and shortness of breath. Negative for chest tightness.   Cardiovascular: Negative for chest pain, palpitations and leg swelling.  Gastrointestinal: Negative for abdominal distention and  abdominal pain.  Endocrine: Negative.   Genitourinary: Negative.   Musculoskeletal: Negative for back pain and neck pain.  Skin: Negative.   Allergic/Immunologic: Negative.   Neurological: Positive for weakness. Negative for dizziness and light-headedness.  Hematological: Negative for adenopathy. Does not bruise/bleed easily.  Psychiatric/Behavioral: Positive for sleep disturbance (wearing CPAP; sleeping on 1 pillow). Negative for dysphoric mood. The patient is not nervous/anxious.    Vitals:   01/04/20 1416  BP: (!) 147/55  Pulse: (!) 55  Resp: 16  SpO2: 99%  Weight: 202 lb (91.6 kg)  Height: 5\' 6"  (1.676 m)   Wt Readings from Last 3 Encounters:  01/04/20 202 lb (91.6 kg)  12/30/19 197 lb 8 oz (89.6 kg)  12/17/19 196 lb 14.4 oz (89.3 kg)   Lab Results  Component Value Date   CREATININE 1.48 (H) 12/30/2019   CREATININE 1.67 (H) 12/29/2019   CREATININE 1.74 (H) 12/28/2019    Physical Exam Vitals and nursing note reviewed.  Constitutional:      Appearance: Normal appearance.  HENT:     Head: Normocephalic and atraumatic.  Cardiovascular:     Rate and Rhythm: Regular rhythm. Bradycardia present.  Pulmonary:     Effort: Pulmonary effort is normal.     Breath sounds: Normal breath sounds. No wheezing or rales.  Abdominal:     General: There is no distension.     Palpations: Abdomen is soft.  Musculoskeletal:        General: No tenderness.     Cervical back: Normal range of motion and neck supple.     Right lower leg: No edema.     Left  lower leg: No edema.  Skin:    General: Skin is warm and dry.  Neurological:     General: No focal deficit present.     Mental Status: He is alert and oriented to person, place, and time.  Psychiatric:        Mood and Affect: Mood normal.        Behavior: Behavior normal.    Assessment & Plan:  1: Chronic heart failure with preserved ejection fraction- - NYHA class II - euvolemic today - weighing daily and he was reminded to call for an overnight weight gain of >2 pounds or a weekly weight gain of >5 pounds - adds salt to his food when he cooks but doesn't add any after. Doesn't like the taste of Mrs. Dash. Reviewed the importance of closely following a 2000mg  sodium diet  - saw cardiology Clayborn Bigness) 06/16/2019  - waiting on cardiac rehab to begin - BNP 12/21/19 was 328.0 - he doesn't take the flu vaccine due to allergy; encouraged diligent handwashing  2: HTN- - BP mildly elevated and he says that it's "always elevated" in the mornings with the top number being 160's-180's - takes his 2nd dose of entresto ~ 3pm; advised him to try taking it closer to 6pm and see if that helps; he is to let me know if his BP continues to be that high in the mornings - saw PCP [facility]) one week ago; says that he may consider changing PCP to Stephanie Coup since he sees pulmonology S. Humphrey Rolls - BMP from 12/30/19 reviewed and showed sodium 139, potassium 4.2, creatinine 1.48 and GFR 44  3: DM with CKD- - glucose this morning was 202 - saw endocrinologist ([place]) 09/11/2019 - A1c on 12/15/19 was 8.7% - saw nephrologist Kem Boroughs) 12/21/19  4: Obstructive sleep apnea- - wears CPAP nightly but says that  he doesn't sleep well - worked night shift for 12 years - saw pulmonologist Humphrey Rolls) 10/19/2019  Medication bottles were reviewed.   Return in 2 months or sooner for any questions/problems before then.

## 2020-01-04 ENCOUNTER — Encounter: Payer: Self-pay | Admitting: Family

## 2020-01-04 ENCOUNTER — Other Ambulatory Visit: Payer: Self-pay

## 2020-01-04 ENCOUNTER — Ambulatory Visit: Payer: Medicare Other | Admitting: Family

## 2020-01-04 ENCOUNTER — Ambulatory Visit: Payer: Medicare Other | Admitting: Internal Medicine

## 2020-01-04 ENCOUNTER — Other Ambulatory Visit
Admission: RE | Admit: 2020-01-04 | Discharge: 2020-01-04 | Disposition: A | Discharge status: Home / Self Care | Payer: Medicare Other | Source: Ambulatory Visit | Attending: Internal Medicine | Admitting: Internal Medicine

## 2020-01-04 ENCOUNTER — Other Ambulatory Visit: Payer: Self-pay | Admitting: Internal Medicine

## 2020-01-04 VITALS — BP 147/55 | HR 55 | Resp 16 | Ht 66.0 in | Wt 202.0 lb

## 2020-01-04 DIAGNOSIS — Z8261 Family history of arthritis: Secondary | ICD-10-CM | POA: Insufficient documentation

## 2020-01-04 DIAGNOSIS — Z79899 Other long term (current) drug therapy: Secondary | ICD-10-CM | POA: Insufficient documentation

## 2020-01-04 DIAGNOSIS — Z886 Allergy status to analgesic agent status: Secondary | ICD-10-CM | POA: Insufficient documentation

## 2020-01-04 DIAGNOSIS — Z888 Allergy status to other drugs, medicaments and biological substances status: Secondary | ICD-10-CM | POA: Insufficient documentation

## 2020-01-04 DIAGNOSIS — Z85828 Personal history of other malignant neoplasm of skin: Secondary | ICD-10-CM | POA: Insufficient documentation

## 2020-01-04 DIAGNOSIS — Z887 Allergy status to serum and vaccine status: Secondary | ICD-10-CM | POA: Insufficient documentation

## 2020-01-04 DIAGNOSIS — I252 Old myocardial infarction: Secondary | ICD-10-CM | POA: Insufficient documentation

## 2020-01-04 DIAGNOSIS — Z7902 Long term (current) use of antithrombotics/antiplatelets: Secondary | ICD-10-CM | POA: Insufficient documentation

## 2020-01-04 DIAGNOSIS — G4733 Obstructive sleep apnea (adult) (pediatric): Secondary | ICD-10-CM | POA: Insufficient documentation

## 2020-01-04 DIAGNOSIS — N189 Chronic kidney disease, unspecified: Secondary | ICD-10-CM | POA: Insufficient documentation

## 2020-01-04 DIAGNOSIS — Z8501 Personal history of malignant neoplasm of esophagus: Secondary | ICD-10-CM | POA: Insufficient documentation

## 2020-01-04 DIAGNOSIS — M109 Gout, unspecified: Secondary | ICD-10-CM | POA: Insufficient documentation

## 2020-01-04 DIAGNOSIS — Z8546 Personal history of malignant neoplasm of prostate: Secondary | ICD-10-CM | POA: Diagnosis not present

## 2020-01-04 DIAGNOSIS — E1122 Type 2 diabetes mellitus with diabetic chronic kidney disease: Secondary | ICD-10-CM | POA: Insufficient documentation

## 2020-01-04 DIAGNOSIS — I5032 Chronic diastolic (congestive) heart failure: Secondary | ICD-10-CM

## 2020-01-04 DIAGNOSIS — I2510 Atherosclerotic heart disease of native coronary artery without angina pectoris: Secondary | ICD-10-CM | POA: Insufficient documentation

## 2020-01-04 DIAGNOSIS — Z87891 Personal history of nicotine dependence: Secondary | ICD-10-CM | POA: Insufficient documentation

## 2020-01-04 DIAGNOSIS — M1990 Unspecified osteoarthritis, unspecified site: Secondary | ICD-10-CM | POA: Insufficient documentation

## 2020-01-04 DIAGNOSIS — E1151 Type 2 diabetes mellitus with diabetic peripheral angiopathy without gangrene: Secondary | ICD-10-CM | POA: Insufficient documentation

## 2020-01-04 DIAGNOSIS — Z955 Presence of coronary angioplasty implant and graft: Secondary | ICD-10-CM | POA: Insufficient documentation

## 2020-01-04 DIAGNOSIS — I130 Hypertensive heart and chronic kidney disease with heart failure and stage 1 through stage 4 chronic kidney disease, or unspecified chronic kidney disease: Secondary | ICD-10-CM | POA: Insufficient documentation

## 2020-01-04 DIAGNOSIS — Z881 Allergy status to other antibiotic agents status: Secondary | ICD-10-CM | POA: Insufficient documentation

## 2020-01-04 DIAGNOSIS — Z794 Long term (current) use of insulin: Secondary | ICD-10-CM

## 2020-01-04 DIAGNOSIS — Z86010 Personal history of colonic polyps: Secondary | ICD-10-CM | POA: Diagnosis not present

## 2020-01-04 DIAGNOSIS — I10 Essential (primary) hypertension: Secondary | ICD-10-CM

## 2020-01-04 DIAGNOSIS — Z7982 Long term (current) use of aspirin: Secondary | ICD-10-CM | POA: Insufficient documentation

## 2020-01-04 DIAGNOSIS — Z7951 Long term (current) use of inhaled steroids: Secondary | ICD-10-CM | POA: Insufficient documentation

## 2020-01-04 DIAGNOSIS — Z880 Allergy status to penicillin: Secondary | ICD-10-CM | POA: Insufficient documentation

## 2020-01-04 DIAGNOSIS — I509 Heart failure, unspecified: Secondary | ICD-10-CM | POA: Diagnosis present

## 2020-01-04 DIAGNOSIS — E785 Hyperlipidemia, unspecified: Secondary | ICD-10-CM | POA: Insufficient documentation

## 2020-01-04 DIAGNOSIS — D649 Anemia, unspecified: Secondary | ICD-10-CM | POA: Diagnosis not present

## 2020-01-04 DIAGNOSIS — Z8719 Personal history of other diseases of the digestive system: Secondary | ICD-10-CM | POA: Insufficient documentation

## 2020-01-04 DIAGNOSIS — N1832 Chronic kidney disease, stage 3b: Secondary | ICD-10-CM

## 2020-01-04 LAB — BASIC METABOLIC PANEL
Anion gap: 9 (ref 5–15)
BUN: 44 mg/dL — ABNORMAL HIGH (ref 8–23)
CO2: 21 mmol/L — ABNORMAL LOW (ref 22–32)
Calcium: 9.2 mg/dL (ref 8.9–10.3)
Chloride: 108 mmol/L (ref 98–111)
Creatinine, Ser: 1.93 mg/dL — ABNORMAL HIGH (ref 0.61–1.24)
GFR calc Af Amer: 37 mL/min — ABNORMAL LOW (ref >60)
GFR calc non Af Amer: 32 mL/min — ABNORMAL LOW (ref >60)
Glucose, Bld: 85 mg/dL (ref 70–99)
Potassium: 5.2 mmol/L — ABNORMAL HIGH (ref 3.5–5.1)
Sodium: 138 mmol/L (ref 135–145)

## 2020-01-04 LAB — BRAIN NATRIURETIC PEPTIDE: B Natriuretic Peptide: 219 pg/mL — ABNORMAL HIGH (ref 0.0–100.0)

## 2020-01-04 NOTE — Patient Instructions (Addendum)
Continue weighing daily and call for an overnight weight gain of > 2 pounds or a weekly weight gain of >5 pounds.  Take your second dose of entresto closer to 6pm.

## 2020-01-06 ENCOUNTER — Other Ambulatory Visit
Admission: RE | Admit: 2020-01-06 | Discharge: 2020-01-06 | Disposition: A | Discharge status: Home / Self Care | Payer: Medicare Other | Source: Ambulatory Visit | Attending: Internal Medicine | Admitting: Internal Medicine

## 2020-01-06 DIAGNOSIS — Z79899 Other long term (current) drug therapy: Secondary | ICD-10-CM | POA: Insufficient documentation

## 2020-01-06 DIAGNOSIS — N184 Chronic kidney disease, stage 4 (severe): Secondary | ICD-10-CM | POA: Diagnosis not present

## 2020-01-06 DIAGNOSIS — I10 Essential (primary) hypertension: Secondary | ICD-10-CM | POA: Diagnosis not present

## 2020-01-06 DIAGNOSIS — I209 Angina pectoris, unspecified: Secondary | ICD-10-CM | POA: Diagnosis not present

## 2020-01-06 DIAGNOSIS — I2510 Atherosclerotic heart disease of native coronary artery without angina pectoris: Secondary | ICD-10-CM | POA: Diagnosis not present

## 2020-01-06 DIAGNOSIS — R002 Palpitations: Secondary | ICD-10-CM | POA: Diagnosis not present

## 2020-01-06 DIAGNOSIS — I214 Non-ST elevation (NSTEMI) myocardial infarction: Secondary | ICD-10-CM | POA: Diagnosis not present

## 2020-01-06 DIAGNOSIS — E1142 Type 2 diabetes mellitus with diabetic polyneuropathy: Secondary | ICD-10-CM | POA: Diagnosis not present

## 2020-01-06 DIAGNOSIS — G459 Transient cerebral ischemic attack, unspecified: Secondary | ICD-10-CM | POA: Diagnosis not present

## 2020-01-06 DIAGNOSIS — I739 Peripheral vascular disease, unspecified: Secondary | ICD-10-CM | POA: Diagnosis not present

## 2020-01-06 DIAGNOSIS — R0602 Shortness of breath: Secondary | ICD-10-CM | POA: Diagnosis not present

## 2020-01-06 DIAGNOSIS — E782 Mixed hyperlipidemia: Secondary | ICD-10-CM | POA: Diagnosis not present

## 2020-01-06 LAB — BRAIN NATRIURETIC PEPTIDE: B Natriuretic Peptide: 210 pg/mL — ABNORMAL HIGH (ref 0.0–100.0)

## 2020-01-07 ENCOUNTER — Telehealth: Payer: Self-pay | Admitting: Family

## 2020-01-07 NOTE — Telephone Encounter (Signed)
Patient says that he saw cardiology [facility]) yesterday and had lab work done stat but he hasn't heard anything from them yet. He also says that a new medication was called into his pharmacy and he thought it was from Dr Clayborn Bigness but it's actually from Dr. Gabriel Carina. He was wondering what his results from yesterday showed.   Advised patient that he would need to call Dr. Etta Quill office to get those results as I currently can not see those results in care everywhere yet. If he has questions about his new medication, he should also call Dr. Joycie Peek office.   Patient verbalized understanding and says that he'll call Dr. Etta Quill office once he hangs up with me.

## 2020-01-11 DIAGNOSIS — E875 Hyperkalemia: Secondary | ICD-10-CM | POA: Diagnosis not present

## 2020-01-11 DIAGNOSIS — I214 Non-ST elevation (NSTEMI) myocardial infarction: Secondary | ICD-10-CM | POA: Diagnosis not present

## 2020-01-11 DIAGNOSIS — I252 Old myocardial infarction: Secondary | ICD-10-CM | POA: Diagnosis not present

## 2020-01-11 DIAGNOSIS — E119 Type 2 diabetes mellitus without complications: Secondary | ICD-10-CM | POA: Diagnosis not present

## 2020-01-11 DIAGNOSIS — I10 Essential (primary) hypertension: Secondary | ICD-10-CM | POA: Diagnosis not present

## 2020-01-18 DIAGNOSIS — Z794 Long term (current) use of insulin: Secondary | ICD-10-CM | POA: Diagnosis not present

## 2020-01-18 DIAGNOSIS — E1165 Type 2 diabetes mellitus with hyperglycemia: Secondary | ICD-10-CM | POA: Diagnosis not present

## 2020-01-18 DIAGNOSIS — R809 Proteinuria, unspecified: Secondary | ICD-10-CM | POA: Diagnosis not present

## 2020-01-18 DIAGNOSIS — E1129 Type 2 diabetes mellitus with other diabetic kidney complication: Secondary | ICD-10-CM | POA: Diagnosis not present

## 2020-01-18 DIAGNOSIS — N1832 Chronic kidney disease, stage 3b: Secondary | ICD-10-CM | POA: Diagnosis not present

## 2020-01-18 DIAGNOSIS — E1142 Type 2 diabetes mellitus with diabetic polyneuropathy: Secondary | ICD-10-CM | POA: Diagnosis not present

## 2020-01-18 DIAGNOSIS — E1121 Type 2 diabetes mellitus with diabetic nephropathy: Secondary | ICD-10-CM | POA: Diagnosis not present

## 2020-01-18 DIAGNOSIS — Z9289 Personal history of other medical treatment: Secondary | ICD-10-CM | POA: Diagnosis not present

## 2020-01-18 DIAGNOSIS — E1159 Type 2 diabetes mellitus with other circulatory complications: Secondary | ICD-10-CM | POA: Diagnosis not present

## 2020-01-19 ENCOUNTER — Telehealth: Payer: Self-pay

## 2020-01-19 NOTE — Telephone Encounter (Signed)
Confirmed appointment on 01/21/2020 and screened for covid. [facility]

## 2020-01-21 ENCOUNTER — Other Ambulatory Visit: Payer: Self-pay

## 2020-01-21 ENCOUNTER — Encounter: Payer: Self-pay | Admitting: Internal Medicine

## 2020-01-21 ENCOUNTER — Ambulatory Visit (INDEPENDENT_AMBULATORY_CARE_PROVIDER_SITE_OTHER): Payer: Medicare Other | Admitting: Internal Medicine

## 2020-01-21 VITALS — BP 143/56 | HR 50 | Temp 97.1°F | Resp 16 | Ht 66.0 in | Wt 205.0 lb

## 2020-01-21 DIAGNOSIS — Z9989 Dependence on other enabling machines and devices: Secondary | ICD-10-CM | POA: Diagnosis not present

## 2020-01-21 DIAGNOSIS — I214 Non-ST elevation (NSTEMI) myocardial infarction: Secondary | ICD-10-CM

## 2020-01-21 DIAGNOSIS — J449 Chronic obstructive pulmonary disease, unspecified: Secondary | ICD-10-CM

## 2020-01-21 DIAGNOSIS — G4733 Obstructive sleep apnea (adult) (pediatric): Secondary | ICD-10-CM

## 2020-01-21 DIAGNOSIS — R0602 Shortness of breath: Secondary | ICD-10-CM

## 2020-01-21 DIAGNOSIS — N184 Chronic kidney disease, stage 4 (severe): Secondary | ICD-10-CM | POA: Diagnosis not present

## 2020-01-21 DIAGNOSIS — J4489 Other specified chronic obstructive pulmonary disease: Secondary | ICD-10-CM

## 2020-01-21 NOTE — Progress Notes (Signed)
[facility] [place], [place] [address]  Pulmonary Sleep Medicine   Office Visit Note  Patient Name: Mike Decker DOB: 1941-07-01 MRN [id number]  Date of Service: 01/21/2020  Complaints/HPI: Patient is here for follow-up of CAD SOB CHF.  Patient was admitted to the hospital because of shortness of breath and chest pain.  Apparently was diagnosed with non-STEMI.  Underwent cardiac catheterization and was found to have mild to moderate disease of the LAD and RCA.  The patient had 95% stenosis on the left circumflex and did receive a stent for the left circumflex.  States he is doing better he still gets some shortness of breath however.  Denies having any active chest pain at this time the he is on the medications that were recommended post catheterization  ROS  General: (-) fever, (-) chills, (-) night sweats, (-) weakness Skin: (-) rashes, (-) itching,. Eyes: (-) visual changes, (-) redness, (-) itching. Nose and Sinuses: (-) nasal stuffiness or itchiness, (-) postnasal drip, (-) nosebleeds, (-) sinus trouble. Mouth and Throat: (-) sore throat, (-) hoarseness. Neck: (-) swollen glands, (-) enlarged thyroid, (-) neck pain. Respiratory: - cough, (-) bloody sputum, + shortness of breath, - wheezing. Cardiovascular: - ankle swelling, (-) chest pain. Lymphatic: (-) lymph node enlargement. Neurologic: (-) numbness, (-) tingling. Psychiatric: (-) anxiety, (-) depression   Current Medication: Outpatient Encounter Medications as of 01/21/2020  Medication Sig Note  . acetaminophen (TYLENOL) 500 MG tablet Take 1,000 mg by mouth every 6 (six) hours as needed for mild pain, moderate pain or headache.   . allopurinol (ZYLOPRIM) 100 MG tablet Take 100 mg by mouth daily.    Marland Kitchen amLODipine (NORVASC) 5 MG tablet Take 5 mg by mouth 2 (two) times daily.    Marland Kitchen aspirin EC 81 MG EC tablet Take 1 tablet (81 mg total) by mouth daily.   . budesonide-formoterol (SYMBICORT)  160-4.5 MCG/ACT inhaler Inhale 2 puffs into the lungs 2 (two) times daily as needed.    . cetirizine (ZYRTEC) 5 MG tablet Take 5 mg by mouth daily as needed for allergies or rhinitis.    . citalopram (CELEXA) 10 MG tablet Take 10 mg by mouth daily.   . clopidogrel (PLAVIX) 75 MG tablet Take 1 tablet (75 mg total) by mouth daily.   . colchicine 0.6 MG tablet Take 3 tablets (1.8 mg total) by mouth daily as needed (gout flares).   Marland Kitchen dextromethorphan-guaiFENesin (MUCINEX DM) 30-600 MG 12hr tablet Take 1 tablet by mouth 2 (two) times daily as needed for cough.   . EPINEPHrine (EPI-PEN) 0.3 mg/0.3 mL SOAJ injection Inject into the muscle as needed (anaphylaxis).    . ferrous sulfate 325 (65 FE) MG EC tablet Take 1 tablet (325 mg total) by mouth daily.   . fluticasone (FLONASE) 50 MCG/ACT nasal spray Place 2 sprays into both nostrils 2 (two) times daily.    . folic acid (V-R FOLIC ACID) 191 MCG tablet Take 1 tablet (400 mcg total) by mouth daily.   . furosemide (LASIX) 20 MG tablet Take 2 tablets (40 mg total) by mouth daily.   . hydrALAZINE (APRESOLINE) 25 MG tablet Take 1 tablet (25 mg total) by mouth every 8 (eight) hours. (Patient taking differently: Take 25 mg by mouth 3 (three) times daily. )   . insulin glargine (LANTUS) 100 UNIT/ML injection Inject 0.35 mLs (35 Units total) into the skin at bedtime. This is a decrease from your previous 45 units nightly.   Marland Kitchen  ipratropium-albuterol (DUONEB) 0.5-2.5 (3) MG/3ML SOLN Take 3 mLs by nebulization every 4 (four) hours as needed.   . isosorbide mononitrate (IMDUR) 60 MG 24 hr tablet Take 60 mg by mouth daily.    . Magnesium 250 MG TABS Take 250 mg by mouth daily.   . methocarbamol (ROBAXIN) 500 MG tablet Take 500 mg by mouth 2 (two) times daily.    . metoprolol tartrate (LOPRESSOR) 50 MG tablet Take 75 mg by mouth 2 (two) times daily.    . montelukast (SINGULAIR) 10 MG tablet TAKE 1 TABLET BY MOUTH EVERYDAY AT BEDTIME   . nitroGLYCERIN (NITROSTAT) 0.4 MG  SL tablet Place 0.4 mg under the tongue every 5 (five) minutes x 3 doses as needed for chest pain.    . Omega-3 Fatty Acids (FISH OIL) 1000 MG CAPS Take 1 capsule by mouth daily.    . pantoprazole (PROTONIX) 40 MG tablet Take 1 tablet (40 mg total) by mouth 2 (two) times daily before a meal.   . rosuvastatin (CRESTOR) 40 MG tablet Take 40 mg by mouth every evening.    . sacubitril-valsartan (ENTRESTO) 24-26 MG Take 1 tablet by mouth 2 (two) times daily.   . vitamin C (ASCORBIC ACID) 250 MG tablet Take 250 mg by mouth daily.   . Vitamin D, Ergocalciferol, (DRISDOL) 50000 units CAPS capsule Take 50,000 Units by mouth every 30 (thirty) days.  12/14/2019: 23rd   . zinc gluconate 50 MG tablet Take 50 mg by mouth daily.    No facility-administered encounter medications on file as of 01/21/2020.    Surgical History: Past Surgical History:  Procedure Laterality Date  . CATARACT EXTRACTION  2011, 2012  . COLONOSCOPY N/A 12/14/2018   Procedure: COLONOSCOPY;  Surgeon: Virgel Manifold, MD;  Location: [facility] ENDOSCOPY;  Service: Endoscopy;  Laterality: N/A;  . CORONARY ANGIOPLASTY WITH STENT PLACEMENT  2012  . CORONARY STENT INTERVENTION N/A 12/29/2019   Procedure: CORONARY STENT INTERVENTION;  Surgeon: Nelva Bush, MD;  Location: [place] [facility];  Service: Cardiovascular;  Laterality: N/A;  . ESOPHAGOGASTRODUODENOSCOPY N/A 12/14/2018   Procedure: ESOPHAGOGASTRODUODENOSCOPY (EGD);  Surgeon: Virgel Manifold, MD;  Location: [facility] ENDOSCOPY;  Service: Endoscopy;  Laterality: N/A;  . ESOPHAGOGASTRODUODENOSCOPY (EGD) WITH PROPOFOL N/A 06/01/2016   Procedure: ESOPHAGOGASTRODUODENOSCOPY (EGD) WITH PROPOFOL;  Surgeon: Manya Silvas, MD;  Location: [facility] ENDOSCOPY;  Service: Endoscopy;  Laterality: N/A;  . HERNIA REPAIR     x2  . INTRAOCULAR LENS INSERTION    . LEFT HEART CATH AND CORONARY ANGIOGRAPHY N/A 05/09/2017   Procedure: Left Heart Cath and Coronary Angiography;  Surgeon: Yolonda Kida,  MD;  Location: [place] [facility];  Service: Cardiovascular;  Laterality: N/A;  . LEFT HEART CATH AND CORONARY ANGIOGRAPHY N/A 12/08/2018   Procedure: LEFT HEART CATH AND CORONARY ANGIOGRAPHY and possible PCI and stent;  Surgeon: Yolonda Kida, MD;  Location: [place] [facility];  Service: Cardiovascular;  Laterality: N/A;  . LEFT HEART CATH AND CORONARY ANGIOGRAPHY N/A 12/29/2019   Procedure: LEFT HEART CATH AND CORONARY ANGIOGRAPHY;  Surgeon: Nelva Bush, MD;  Location: [place] [facility];  Service: Cardiovascular;  Laterality: N/A;  . NOSE SURGERY     submucous resection  . PROSTATE SURGERY  2002  . ROTATOR CUFF REPAIR Right     Medical History: Past Medical History:  Diagnosis Date  . Anemia   . Arthritis   . Atrioventricular canal (AVC)    irregular heart beats  . Barrett esophagus   .  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 Chronic diastolic CHF (congestive heart failure) ([place])   . Colon polyp   . Diabetes mellitus without complication ([place])   . Diverticulosis   . Gout   . Heart disease   . Hemangioma    liver  . Hyperlipidemia   . Hypertension   . Myocardial infarct ([place])   . Ocular hypertension   . Peripheral vascular disease ([place])   . Skin cancer   . Sleep apnea   . Vitreoretinal degeneration     Family History: Family History  Problem Relation Age of Onset  . Arthritis Mother   . Stroke Maternal Grandfather   . Breast cancer Neg Hx     Social History: Social History   Socioeconomic History  . Marital status: Married    Spouse name: Not on file  . Number of children: Not on file  . Years of education: Not on file  . Highest education level: Not on file  Occupational History  . Not on file  Tobacco Use  . Smoking status: Former Smoker    Years: 20.00    Types: Cigarettes    Quit date: 12/10/1976    Years since quitting: 43.1  . Smokeless tobacco: Never Used  Substance and Sexual Activity  . Alcohol use: No  . Drug use: No  .  Sexual activity: Not on file  Other Topics Concern  . Not on file  Social History Narrative  . Not on file   Social Determinants of Health   Financial Resource Strain:   . Difficulty of Paying Living Expenses: Not on file  Food Insecurity:   . Worried About Charity fundraiser in the Last Year: Not on file  . Ran Out of Food in the Last Year: Not on file  Transportation Needs:   . Lack of Transportation (Medical): Not on file  . Lack of Transportation (Non-Medical): Not on file  Physical Activity:   . Days of Exercise per Week: Not on file  . Minutes of Exercise per Session: Not on file  Stress:   . Feeling of Stress : Not on file  Social Connections:   . Frequency of Communication with Friends and Family: Not on file  . Frequency of Social Gatherings with Friends and Family: Not on file  . Attends Religious Services: Not on file  . Active Member of Clubs or Organizations: Not on file  . Attends Archivist Meetings: Not on file  . Marital Status: Not on file  Intimate Partner Violence:   . Fear of Current or Ex-Partner: Not on file  . Emotionally Abused: Not on file  . Physically Abused: Not on file  . Sexually Abused: Not on file    Vital Signs: Blood pressure (!) 143/56, pulse (!) 50, temperature (!) 97.1 F (36.2 C), resp. rate 16, height 5\' 6"  (1.676 m), weight 205 lb (93 kg), SpO2 98 %.  Examination: General Appearance: The patient is well-developed, well-nourished, and in no distress. Skin: Gross inspection of skin unremarkable. Head: normocephalic, no gross deformities. Eyes: no gross deformities noted. ENT: ears appear grossly normal no exudates. Neck: Supple. No thyromegaly. No LAD. Respiratory: No rhonchi and no rales are noted at this time. Cardiovascular: Normal S1 and S2 without murmur or rub. Extremities: No cyanosis. pulses are equal. Neurologic: Alert and oriented. No involuntary movements.  LABS: Recent Results (from the past 2160  hour(s))  Pulmonary Function Test     Status: None  Collection Time: 10/30/19 12:53 PM  Result Value Ref Range   FEV1     FVC     FEV1/FVC     TLC     DLCO    Ferritin     Status: None   Collection Time: 11/10/19  9:43 AM  Result Value Ref Range   Ferritin 77 24 - 336 ng/mL    Comment: Performed at [facility], [address] [place], [place] [address]  Iron and TIBC     Status: Abnormal   Collection Time: 11/10/19  9:43 AM  Result Value Ref Range   Iron 47 45 - 182 ug/dL   TIBC 308 250 - 450 ug/dL   Saturation Ratios 15 (L) 17.9 - 39.5 %   UIBC 261 ug/dL    Comment: Performed at [facility], [address] [place], [place] [address]  Vitamin B12     Status: None   Collection Time: 11/10/19  9:43 AM  Result Value Ref Range   Vitamin B-12 912 180 - 914 pg/mL    Comment: (NOTE) This assay is not validated for testing neonatal or myeloproliferative syndrome specimens for Vitamin B12 levels. Performed at [facility], [place] [address], [place], [place] [address]   CBC with Differential/Platelet     Status: Abnormal   Collection Time: 11/10/19  9:43 AM  Result Value Ref Range   WBC 8.9 4.0 - 10.5 K/uL   RBC 3.82 (L) 4.22 - 5.81 MIL/uL   Hemoglobin 11.6 (L) 13.0 - 17.0 g/dL   HCT 36.1 (L) 39.0 - 52.0 %   MCV 94.5 80.0 - 100.0 fL   MCH 30.4 26.0 - 34.0 pg   MCHC 32.1 30.0 - 36.0 g/dL   RDW 13.9 11.5 - 15.5 %   Platelets 237 150 - 400 K/uL   nRBC 0.0 0.0 - 0.2 %   Neutrophils Relative % 67 %   Neutro Abs 5.9 1.7 - 7.7 K/uL   Lymphocytes Relative 14 %   Lymphs Abs 1.3 0.7 - 4.0 K/uL   Monocytes Relative 8 %   Monocytes Absolute 0.7 0.1 - 1.0 K/uL   Eosinophils Relative 9 %   Eosinophils Absolute 0.8 (H) 0.0 - 0.5 K/uL   Basophils Relative 1 %   Basophils Absolute 0.1 0.0 - 0.1 K/uL   Immature Granulocytes 1 %   Abs Immature Granulocytes 0.07 0.00 - 0.07 K/uL    Comment: Performed at [facility], [address] [place], [place]  [address]  Comprehensive metabolic panel     Status: Abnormal   Collection Time: 11/10/19  9:43 AM  Result Value Ref Range   Sodium 134 (L) 135 - 145 mmol/L   Potassium 5.0 3.5 - 5.1 mmol/L   Chloride 106 98 - 111 mmol/L   CO2 21 (L) 22 - 32 mmol/L   Glucose, Bld 343 (H) 70 - 99 mg/dL   BUN 43 (H) 8 - 23 mg/dL   Creatinine, Ser 2.01 (H) 0.61 - 1.24 mg/dL   Calcium 8.6 (L) 8.9 - 10.3 mg/dL   Total Protein 6.9 6.5 - 8.1 g/dL   Albumin 3.7 3.5 - 5.0 g/dL   AST 12 (L) 15 - 41 U/L   ALT 12 0 - 44 U/L   Alkaline Phosphatase 72 38 - 126 U/L   Total Bilirubin 0.5 0.3 - 1.2 mg/dL   GFR calc non Af Amer 31 (L) >60 mL/min   GFR calc Af Amer 36 (L) >60 mL/min  Anion gap 7 5 - 15    Comment: Performed at [facility], [address] [place], [place] [address]  Brain natriuretic peptide     Status: Abnormal   Collection Time: 12/01/19 11:51 AM  Result Value Ref Range   B Natriuretic Peptide 197.0 (H) 0.0 - 100.0 pg/mL    Comment: Performed at [facility], [address] [place], [place] [address]  Basic metabolic panel     Status: Abnormal   Collection Time: 12/14/19  2:33 AM  Result Value Ref Range   Sodium 137 135 - 145 mmol/L   Potassium 4.0 3.5 - 5.1 mmol/L   Chloride 105 98 - 111 mmol/L   CO2 21 (L) 22 - 32 mmol/L   Glucose, Bld 290 (H) 70 - 99 mg/dL   BUN 42 (H) 8 - 23 mg/dL   Creatinine, Ser 1.60 (H) 0.61 - 1.24 mg/dL   Calcium 8.9 8.9 - 10.3 mg/dL   GFR calc non Af Amer 40 (L) >60 mL/min   GFR calc Af Amer 47 (L) >60 mL/min   Anion gap 11 5 - 15    Comment: Performed at [facility], [address] [place], [place] [address]  CBC     Status: Abnormal   Collection Time: 12/14/19  2:33 AM  Result Value Ref Range   WBC 10.7 (H) 4.0 - 10.5 K/uL   RBC 4.03 (L) 4.22 - 5.81 MIL/uL   Hemoglobin 12.2 (L) 13.0 - 17.0 g/dL   HCT 35.2 (L) 39.0 - 52.0 %   MCV 87.3 80.0 - 100.0 fL   MCH 30.3 26.0 - 34.0 pg   MCHC 34.7 30.0 - 36.0 g/dL   RDW 14.4 11.5 - 15.5  %   Platelets 227 150 - 400 K/uL   nRBC 0.0 0.0 - 0.2 %    Comment: Performed at [facility], [place], [place] [address]  Troponin I (High Sensitivity)     Status: Abnormal   Collection Time: 12/14/19  2:33 AM  Result Value Ref Range   Troponin I (High Sensitivity) 93 (H) <18 ng/L    Comment: (NOTE) Elevated high sensitivity troponin I (hsTnI) values and significant  changes across serial measurements may suggest ACS but many other  chronic and acute conditions are known to elevate hsTnI results.  Refer to the "Links" section for chest pain algorithms and additional  guidance. Performed at [facility], [place], [place] [address]   Troponin I (High Sensitivity)     Status: Abnormal   Collection Time: 12/14/19  5:22 AM  Result Value Ref Range   Troponin I (High Sensitivity) 125 (HH) <18 ng/L    Comment: CRITICAL RESULT CALLED TO, READ BACK BY AND VERIFIED WITH DAWN [facility] 12/14/19 0625 [facility] (NOTE) Elevated high sensitivity troponin I (hsTnI) values and significant  changes across serial measurements may suggest ACS but many other  chronic and acute conditions are known to elevate hsTnI results.  Refer to the "Links" section for chest pain algorithms and additional  guidance. Performed at [facility], [address] [place], [place] [address]   Protime-INR     Status: None   Collection Time: 12/14/19  7:52 AM  Result Value Ref Range   Prothrombin Time 12.5 11.4 - 15.2 seconds   INR 0.9 0.8 - 1.2    Comment: (NOTE) INR goal varies based on device and disease states. Performed at [facility], [address],  [place], [place] [address]   Brain natriuretic peptide     Status: Abnormal   Collection Time: 12/14/19  7:52 AM  Result Value Ref Range   B Natriuretic Peptide 364.0 (H) 0.0 - 100.0 pg/mL    Comment: Performed at [facility], [address] [place], [place] [address]  APTT      Status: None   Collection Time: 12/14/19  7:52 AM  Result Value Ref Range   aPTT 32 24 - 36 seconds    Comment: Performed at [facility], [address] [place], [place] [address]  Respiratory Panel by RT PCR (Flu A&B, Covid) - Nasopharyngeal Swab     Status: None   Collection Time: 12/14/19  7:52 AM   Specimen: Nasopharyngeal Swab  Result Value Ref Range   SARS Coronavirus 2 by RT PCR NEGATIVE NEGATIVE    Comment: (NOTE) SARS-CoV-2 target nucleic acids are NOT DETECTED. The SARS-CoV-2 RNA is generally detectable in upper respiratoy specimens during the acute phase of infection. The lowest concentration of SARS-CoV-2 viral copies this assay can detect is 131 copies/mL. A negative result does not preclude SARS-Cov-2 infection and should not be used as the sole basis for treatment or other patient management decisions. A negative result may occur with  improper specimen collection/handling, submission of specimen other than nasopharyngeal swab, presence of viral mutation(s) within the areas targeted by this assay, and inadequate number of viral copies (<131 copies/mL). A negative result must be combined with clinical observations, patient history, and epidemiological information. The expected result is Negative. Fact Sheet for Patients:  PinkCheek.be Fact Sheet for Healthcare Providers:  GravelBags.it This test is not yet ap proved or cleared by the [place] FDA and  has been authorized for detection and/or diagnosis of SARS-CoV-2 by FDA under an Emergency Use Authorization (EUA). This EUA will remain  in effect (meaning this test can be used) for the duration of the COVID-19 declaration under Section 564(b)(1) of the Act, 21 U.S.C. section 360bbb-3(b)(1), unless the authorization is terminated or revoked sooner.    Influenza A by PCR NEGATIVE NEGATIVE   Influenza B by PCR NEGATIVE NEGATIVE    Comment:  (NOTE) The Xpert Xpress SARS-CoV-2/FLU/RSV assay is intended as an aid in  the diagnosis of influenza from Nasopharyngeal swab specimens and  should not be used as a sole basis for treatment. Nasal washings and  aspirates are unacceptable for Xpert Xpress SARS-CoV-2/FLU/RSV  testing. Fact Sheet for Patients: PinkCheek.be Fact Sheet for Healthcare Providers: GravelBags.it This test is not yet approved or cleared by the [place] FDA and  has been authorized for detection and/or diagnosis of SARS-CoV-2 by  FDA under an Emergency Use Authorization (EUA). This EUA will remain  in effect (meaning this test can be used) for the duration of the  Covid-19 declaration under Section 564(b)(1) of the Act, 21  U.S.C. section 360bbb-3(b)(1), unless the authorization is  terminated or revoked. Performed at [facility], [address] [place], [place] [address]   Troponin I (High Sensitivity)     Status: Abnormal   Collection Time: 12/14/19  8:53 AM  Result Value Ref Range   Troponin I (High Sensitivity) 131 (HH) <18 ng/L    Comment: CRITICAL VALUE NOTED. VALUE IS CONSISTENT WITH PREVIOUSLY REPORTED/CALLED VALUE DAS (NOTE) Elevated high sensitivity troponin I (hsTnI) values and significant  changes across serial measurements may suggest ACS but many other  chronic and acute conditions are known to elevate hsTnI results.  Refer to  the "Links" section for chest pain algorithms and additional  guidance. Performed at [facility], [address] [place], [place] [address]   Hemoglobin A1c     Status: Abnormal   Collection Time: 12/14/19  9:34 AM  Result Value Ref Range   Hgb A1c MFr Bld 8.5 (H) 4.8 - 5.6 %    Comment: (NOTE)         Prediabetes: 5.7 - 6.4         Diabetes: >6.4         Glycemic control for adults with diabetes: <7.0    Mean Plasma Glucose 197 mg/dL    Comment: (NOTE) Performed At: [facility] [place], [place] [id number] Rush Farmer MD OX:[id number]   Troponin I (High Sensitivity)     Status: Abnormal   Collection Time: 12/14/19 11:27 AM  Result Value Ref Range   Troponin I (High Sensitivity) 145 (HH) <18 ng/L    Comment: CRITICAL VALUE NOTED. VALUE IS CONSISTENT WITH PREVIOUSLY REPORTED/CALLED VALUE KLW (NOTE) Elevated high sensitivity troponin I (hsTnI) values and significant  changes across serial measurements may suggest ACS but many other  chronic and acute conditions are known to elevate hsTnI results.  Refer to the "Links" section for chest pain algorithms and additional  guidance. Performed at [facility], [address] [place], [place] [address]   Glucose, capillary     Status: Abnormal   Collection Time: 12/14/19 11:41 AM  Result Value Ref Range   Glucose-Capillary 177 (H) 70 - 99 mg/dL  Urine Drug Screen, Qualitative ([facility] only)     Status: None   Collection Time: 12/14/19 11:43 AM  Result Value Ref Range   Tricyclic, Ur Screen NONE DETECTED NONE DETECTED   Amphetamines, Ur Screen NONE DETECTED NONE DETECTED   MDMA (Ecstasy)Ur Screen NONE DETECTED NONE DETECTED   Cocaine Metabolite,Ur [place] NONE DETECTED NONE DETECTED   Opiate, Ur Screen NONE DETECTED NONE DETECTED   Phencyclidine (PCP) Ur S NONE DETECTED NONE DETECTED   Cannabinoid 50 Ng, Ur  NONE DETECTED NONE DETECTED   Barbiturates, Ur Screen NONE DETECTED NONE DETECTED   Benzodiazepine, Ur Scrn NONE DETECTED NONE DETECTED   Methadone Scn, Ur NONE DETECTED NONE DETECTED    Comment: (NOTE) Tricyclics + metabolites, urine    Cutoff 1000 ng/mL Amphetamines + metabolites, urine  Cutoff 1000 ng/mL MDMA (Ecstasy), urine              Cutoff 500 ng/mL Cocaine Metabolite, urine          Cutoff 300 ng/mL Opiate + metabolites, urine        Cutoff 300 ng/mL Phencyclidine (PCP), urine         Cutoff 25 ng/mL Cannabinoid, urine                 Cutoff 50 ng/mL Barbiturates  + metabolites, urine  Cutoff 200 ng/mL Benzodiazepine, urine              Cutoff 200 ng/mL Methadone, urine                   Cutoff 300 ng/mL The urine drug screen provides only a preliminary, unconfirmed analytical test result and should not be used for non-medical purposes. Clinical consideration and professional judgment should be applied to any positive drug screen result due to possible interfering substances. A more specific alternate chemical method must be used in order to obtain a confirmed analytical result. Gas chromatography /  mass spectrometry (GC/MS) is the preferred confirmat ory method. Performed at [facility], [address] [place], [place] [address]   ECHOCARDIOGRAM COMPLETE     Status: None   Collection Time: 12/14/19  2:08 PM  Result Value Ref Range   Weight 3,296 oz   Height 66 in   BP 130/57 mmHg  Troponin I (High Sensitivity)     Status: Abnormal   Collection Time: 12/14/19  2:14 PM  Result Value Ref Range   Troponin I (High Sensitivity) 149 (HH) <18 ng/L    Comment: CRITICAL VALUE NOTED. VALUE IS CONSISTENT WITH PREVIOUSLY REPORTED/CALLED VALUE DAS (NOTE) Elevated high sensitivity troponin I (hsTnI) values and significant  changes across serial measurements may suggest ACS but many other  chronic and acute conditions are known to elevate hsTnI results.  Refer to the "Links" section for chest pain algorithms and additional  guidance. Performed at [facility], [address] [place], [place] [address]   Glucose, capillary     Status: Abnormal   Collection Time: 12/14/19  3:53 PM  Result Value Ref Range   Glucose-Capillary 160 (H) 70 - 99 mg/dL  Heparin level (unfractionated)     Status: Abnormal   Collection Time: 12/14/19  4:19 PM  Result Value Ref Range   Heparin Unfractionated 0.26 (L) 0.30 - 0.70 IU/mL    Comment: (NOTE) If heparin results are below expected values, and patient dosage has  been confirmed, suggest follow  up testing of antithrombin III levels. Performed at [facility], [address] [place], [place] [address]   Glucose, capillary     Status: Abnormal   Collection Time: 12/14/19  6:51 PM  Result Value Ref Range   Glucose-Capillary 139 (H) 70 - 99 mg/dL  Glucose, capillary     Status: Abnormal   Collection Time: 12/15/19 12:05 AM  Result Value Ref Range   Glucose-Capillary 188 (H) 70 - 99 mg/dL  AM - HgA1c     Status: Abnormal   Collection Time: 12/15/19  2:09 AM  Result Value Ref Range   Hgb A1c MFr Bld 8.7 (H) 4.8 - 5.6 %    Comment: (NOTE) Pre diabetes:          5.7%-6.4% Diabetes:              >6.4% Glycemic control for   <7.0% adults with diabetes    Mean Plasma Glucose 202.99 mg/dL    Comment: Performed at [facility], [place] [address], [place], [place] [address]  AM - FLP     Status: Abnormal   Collection Time: 12/15/19  2:09 AM  Result Value Ref Range   Cholesterol 107 0 - 200 mg/dL   Triglycerides 260 (H) <150 mg/dL   HDL 22 (L) >40 mg/dL   Total CHOL/HDL Ratio 4.9 RATIO   VLDL 52 (H) 0 - 40 mg/dL   LDL Cholesterol 33 0 - 99 mg/dL    Comment:        Total Cholesterol/HDL:CHD Risk Coronary Heart Disease Risk Table                     Men   Women  1/2 Average Risk   3.4   3.3  Average Risk       5.0   4.4  2 X Average Risk   9.6   7.1  3 X Average Risk  23.4   11.0        Use the  calculated Patient Ratio above and the CHD Risk Table to determine the patient's CHD Risk.        ATP III CLASSIFICATION (LDL):  <100     mg/dL   Optimal  100-129  mg/dL   Near or Above                    Optimal  130-159  mg/dL   Borderline  160-189  mg/dL   High  >190     mg/dL   Very High Performed at [place], [place] [address]   Heparin level (unfractionated)     Status: None   Collection Time: 12/15/19  2:09 AM  Result Value Ref Range   Heparin Unfractionated 0.37 0.30 - 0.70 IU/mL    Comment: (NOTE) If heparin  results are below expected values, and patient dosage has  been confirmed, suggest follow up testing of antithrombin III levels. Performed at [facility], [address] [place], [place] [address]   CBC     Status: Abnormal   Collection Time: 12/15/19  2:09 AM  Result Value Ref Range   WBC 10.8 (H) 4.0 - 10.5 K/uL   RBC 3.54 (L) 4.22 - 5.81 MIL/uL   Hemoglobin 10.8 (L) 13.0 - 17.0 g/dL   HCT 32.8 (L) 39.0 - 52.0 %   MCV 92.7 80.0 - 100.0 fL   MCH 30.5 26.0 - 34.0 pg   MCHC 32.9 30.0 - 36.0 g/dL   RDW 14.5 11.5 - 15.5 %   Platelets 225 150 - 400 K/uL   nRBC 0.0 0.0 - 0.2 %    Comment: Performed at [facility], [address] [place], [place] [address]  Glucose, capillary     Status: Abnormal   Collection Time: 12/15/19  4:16 AM  Result Value Ref Range   Glucose-Capillary 153 (H) 70 - 99 mg/dL  Glucose, capillary     Status: Abnormal   Collection Time: 12/15/19  9:16 AM  Result Value Ref Range   Glucose-Capillary 151 (H) 70 - 99 mg/dL  Heparin level (unfractionated)     Status: None   Collection Time: 12/15/19 10:14 AM  Result Value Ref Range   Heparin Unfractionated 0.59 0.30 - 0.70 IU/mL    Comment: (NOTE) If heparin results are below expected values, and patient dosage has  been confirmed, suggest follow up testing of antithrombin III levels. Performed at [facility], [address] [place], [place] [address]   Glucose, capillary     Status: Abnormal   Collection Time: 12/15/19 12:00 PM  Result Value Ref Range   Glucose-Capillary 187 (H) 70 - 99 mg/dL  Glucose, capillary     Status: Abnormal   Collection Time: 12/15/19  5:05 PM  Result Value Ref Range   Glucose-Capillary 194 (H) 70 - 99 mg/dL  Glucose, capillary     Status: Abnormal   Collection Time: 12/15/19  7:53 PM  Result Value Ref Range   Glucose-Capillary 246 (H) 70 - 99 mg/dL  Glucose, capillary     Status: Abnormal   Collection Time: 12/16/19 12:09 AM  Result Value Ref  Range   Glucose-Capillary 188 (H) 70 - 99 mg/dL  Glucose, capillary     Status: Abnormal   Collection Time: 12/16/19  5:57 AM  Result Value Ref Range   Glucose-Capillary 156 (H) 70 - 99 mg/dL  CBC     Status: Abnormal   Collection Time: 12/16/19  6:33 AM  Result Value Ref Range   WBC 12.3 (H) 4.0 - 10.5 K/uL   RBC 3.83 (L) 4.22 - 5.81 MIL/uL   Hemoglobin 11.4 (L) 13.0 - 17.0 g/dL   HCT 35.7 (L) 39.0 - 52.0 %   MCV 93.2 80.0 - 100.0 fL   MCH 29.8 26.0 - 34.0 pg   MCHC 31.9 30.0 - 36.0 g/dL   RDW 14.3 11.5 - 15.5 %   Platelets 235 150 - 400 K/uL   nRBC 0.0 0.0 - 0.2 %    Comment: Performed at [facility], [address] [place], [place] [address]  Basic metabolic panel     Status: Abnormal   Collection Time: 12/16/19  6:33 AM  Result Value Ref Range   Sodium 135 135 - 145 mmol/L   Potassium 3.9 3.5 - 5.1 mmol/L   Chloride 102 98 - 111 mmol/L   CO2 24 22 - 32 mmol/L   Glucose, Bld 161 (H) 70 - 99 mg/dL   BUN 41 (H) 8 - 23 mg/dL   Creatinine, Ser 1.82 (H) 0.61 - 1.24 mg/dL   Calcium 8.7 (L) 8.9 - 10.3 mg/dL   GFR calc non Af Amer 35 (L) >60 mL/min   GFR calc Af Amer 40 (L) >60 mL/min   Anion gap 9 5 - 15    Comment: Performed at [facility], [address] [place], [place] [address]  Procalcitonin - Baseline     Status: None   Collection Time: 12/16/19  6:33 AM  Result Value Ref Range   Procalcitonin <0.10 ng/mL    Comment:        Interpretation: PCT (Procalcitonin) <= 0.5 ng/mL: Systemic infection (sepsis) is not likely. Local bacterial infection is possible. (NOTE)       Sepsis PCT Algorithm           Lower Respiratory Tract                                      Infection PCT Algorithm    ----------------------------     ----------------------------         PCT < 0.25 ng/mL                PCT < 0.10 ng/mL         Strongly encourage             Strongly discourage   discontinuation of antibiotics    initiation of antibiotics     ----------------------------     -----------------------------       PCT 0.25 - 0.50 ng/mL            PCT 0.10 - 0.25 ng/mL               OR       >80% decrease in PCT            Discourage initiation of                                            antibiotics      Encourage discontinuation           of antibiotics    ----------------------------     -----------------------------         PCT >= 0.50 ng/mL  PCT 0.26 - 0.50 ng/mL               AND        <80% decrease in PCT             Encourage initiation of                                             antibiotics       Encourage continuation           of antibiotics    ----------------------------     -----------------------------        PCT >= 0.50 ng/mL                  PCT > 0.50 ng/mL               AND         increase in PCT                  Strongly encourage                                      initiation of antibiotics    Strongly encourage escalation           of antibiotics                                     -----------------------------                                           PCT <= 0.25 ng/mL                                                 OR                                        > 80% decrease in PCT                                     Discontinue / Do not initiate                                             antibiotics Performed at [facility], [address] [place], [place] [address]   BNP     Status: Abnormal   Collection Time: 12/16/19  6:33 AM  Result Value Ref Range   B Natriuretic Peptide 174.0 (H) 0.0 - 100.0 pg/mL    Comment: Performed at [facility], [address] [place], [place] [address]  Glucose, capillary     Status: Abnormal   Collection Time: 12/16/19  8:00 AM  Result Value Ref Range  Glucose-Capillary 158 (H) 70 - 99 mg/dL  Glucose, capillary     Status: Abnormal   Collection Time: 12/16/19 11:36 AM  Result Value Ref Range   Glucose-Capillary 175 (H) 70  - 99 mg/dL  Glucose, capillary     Status: Abnormal   Collection Time: 12/16/19  1:17 PM  Result Value Ref Range   Glucose-Capillary 157 (H) 70 - 99 mg/dL  Glucose, capillary     Status: Abnormal   Collection Time: 12/16/19  4:24 PM  Result Value Ref Range   Glucose-Capillary 150 (H) 70 - 99 mg/dL  Glucose, capillary     Status: Abnormal   Collection Time: 12/16/19  7:59 PM  Result Value Ref Range   Glucose-Capillary 209 (H) 70 - 99 mg/dL  Glucose, capillary     Status: Abnormal   Collection Time: 12/16/19 11:51 PM  Result Value Ref Range   Glucose-Capillary 134 (H) 70 - 99 mg/dL  Glucose, capillary     Status: Abnormal   Collection Time: 12/17/19  3:56 AM  Result Value Ref Range   Glucose-Capillary 66 (L) 70 - 99 mg/dL  Glucose, capillary     Status: None   Collection Time: 12/17/19  7:26 AM  Result Value Ref Range   Glucose-Capillary 75 70 - 99 mg/dL  Glucose, capillary     Status: Abnormal   Collection Time: 12/17/19 11:48 AM  Result Value Ref Range   Glucose-Capillary 182 (H) 70 - 99 mg/dL  CBC     Status: Abnormal   Collection Time: 12/21/19  3:43 PM  Result Value Ref Range   WBC 8.7 4.0 - 10.5 K/uL   RBC 3.88 (L) 4.22 - 5.81 MIL/uL   Hemoglobin 11.6 (L) 13.0 - 17.0 g/dL   HCT 36.7 (L) 39.0 - 52.0 %   MCV 94.6 80.0 - 100.0 fL   MCH 29.9 26.0 - 34.0 pg   MCHC 31.6 30.0 - 36.0 g/dL   RDW 14.6 11.5 - 15.5 %   Platelets 288 150 - 400 K/uL   nRBC 0.0 0.0 - 0.2 %    Comment: Performed at [facility], [address], [place], [place] [address]  Brain natriuretic peptide     Status: Abnormal   Collection Time: 12/21/19  3:43 PM  Result Value Ref Range   B Natriuretic Peptide 328.0 (H) 0.0 - 100.0 pg/mL    Comment: Performed at [facility], [address], [place], [place] [address]  Electrolyte panel     Status: Abnormal   Collection Time: 12/21/19  3:43 PM  Result Value Ref Range   Sodium 136 135 - 145 mmol/L   Potassium 5.1 3.5 - 5.1 mmol/L    Chloride 104 98 - 111 mmol/L   CO2 21 (L) 22 - 32 mmol/L   Anion gap 11 5 - 15    Comment: Performed at [facility], [address] [place], [place] [address]  BUN     Status: Abnormal   Collection Time: 12/21/19  3:43 PM  Result Value Ref Range   BUN 52 (H) 8 - 23 mg/dL    Comment: Performed at [facility], [address] [place], [place] [address]  Creatinine, serum     Status: Abnormal   Collection Time: 12/21/19  3:43 PM  Result Value Ref Range   Creatinine, Ser 1.80 (H) 0.61 - 1.24 mg/dL   GFR calc non Af Amer 35 (L) >60 mL/min   GFR calc Af Amer 41 (L) >60  mL/min    Comment: Performed at [facility], [address] [place], [place] [address]  Vitamin B12     Status: None   Collection Time: 12/28/19  3:22 PM  Result Value Ref Range   Vitamin B-12 387 180 - 914 pg/mL    Comment: (NOTE) This assay is not validated for testing neonatal or myeloproliferative syndrome specimens for Vitamin B12 levels. Performed at [facility], [place] [place], [place] [address]   Iron and TIBC     Status: None   Collection Time: 12/28/19  3:22 PM  Result Value Ref Range   Iron 61 45 - 182 ug/dL   TIBC 336 250 - 450 ug/dL   Saturation Ratios 18 17.9 - 39.5 %   UIBC 275 ug/dL    Comment: Performed at [facility], [address] [place], [place] [address]  Ferritin     Status: None   Collection Time: 12/28/19  3:22 PM  Result Value Ref Range   Ferritin 73 24 - 336 ng/mL    Comment: Performed at [facility], [address] [place], [place] [address]  Comprehensive metabolic panel     Status: Abnormal   Collection Time: 12/28/19  3:22 PM  Result Value Ref Range   Sodium 137 135 - 145 mmol/L   Potassium 5.2 (H) 3.5 - 5.1 mmol/L   Chloride 105 98 - 111 mmol/L   CO2 22 22 - 32 mmol/L   Glucose, Bld 249 (H) 70 - 99 mg/dL   BUN 42 (H) 8 - 23 mg/dL   Creatinine, Ser 1.98 (H) 0.61 - 1.24 mg/dL   Calcium 9.2 8.9 - 10.3  mg/dL   Total Protein 7.3 6.5 - 8.1 g/dL   Albumin 4.2 3.5 - 5.0 g/dL   AST 14 (L) 15 - 41 U/L   ALT 15 0 - 44 U/L   Alkaline Phosphatase 86 38 - 126 U/L   Total Bilirubin 0.6 0.3 - 1.2 mg/dL   GFR calc non Af Amer 31 (L) >60 mL/min   GFR calc Af Amer 36 (L) >60 mL/min   Anion gap 10 5 - 15    Comment: Performed at [facility], [address] [place], [place] [address]  CBC with Differential     Status: Abnormal   Collection Time: 12/28/19  3:22 PM  Result Value Ref Range   WBC 7.6 4.0 - 10.5 K/uL   RBC 3.95 (L) 4.22 - 5.81 MIL/uL   Hemoglobin 11.7 (L) 13.0 - 17.0 g/dL   HCT 37.2 (L) 39.0 - 52.0 %   MCV 94.2 80.0 - 100.0 fL   MCH 29.6 26.0 - 34.0 pg   MCHC 31.5 30.0 - 36.0 g/dL   RDW 14.6 11.5 - 15.5 %   Platelets 279 150 - 400 K/uL   nRBC 0.0 0.0 - 0.2 %   Neutrophils Relative % 70 %   Neutro Abs 5.3 1.7 - 7.7 K/uL   Lymphocytes Relative 12 %   Lymphs Abs 0.9 0.7 - 4.0 K/uL   Monocytes Relative 8 %   Monocytes Absolute 0.6 0.1 - 1.0 K/uL   Eosinophils Relative 8 %   Eosinophils Absolute 0.6 (H) 0.0 - 0.5 K/uL   Basophils Relative 1 %   Basophils Absolute 0.1 0.0 - 0.1 K/uL   Immature Granulocytes 1 %   Abs Immature Granulocytes 0.06 0.00 - 0.07 K/uL    Comment: Performed at [facility], [address],  [place], [place] [address]  Basic metabolic panel     Status: Abnormal   Collection Time: 12/28/19 10:17 PM  Result Value Ref Range   Sodium 138 135 - 145 mmol/L   Potassium 4.5 3.5 - 5.1 mmol/L   Chloride 107 98 - 111 mmol/L   CO2 21 (L) 22 - 32 mmol/L   Glucose, Bld 270 (H) 70 - 99 mg/dL   BUN 41 (H) 8 - 23 mg/dL   Creatinine, Ser 1.74 (H) 0.61 - 1.24 mg/dL   Calcium 9.1 8.9 - 10.3 mg/dL   GFR calc non Af Amer 36 (L) >60 mL/min   GFR calc Af Amer 42 (L) >60 mL/min   Anion gap 10 5 - 15    Comment: Performed at [facility], [address] [place], [place] [address]  CBC     Status: Abnormal   Collection Time: 12/28/19 10:17 PM   Result Value Ref Range   WBC 8.9 4.0 - 10.5 K/uL   RBC 3.87 (L) 4.22 - 5.81 MIL/uL   Hemoglobin 11.7 (L) 13.0 - 17.0 g/dL   HCT 36.3 (L) 39.0 - 52.0 %   MCV 93.8 80.0 - 100.0 fL   MCH 30.2 26.0 - 34.0 pg   MCHC 32.2 30.0 - 36.0 g/dL   RDW 14.6 11.5 - 15.5 %   Platelets 292 150 - 400 K/uL   nRBC 0.0 0.0 - 0.2 %    Comment: Performed at [facility], [place], [place] [address]  Troponin I (High Sensitivity)     Status: Abnormal   Collection Time: 12/28/19 10:17 PM  Result Value Ref Range   Troponin I (High Sensitivity) 23 (H) <18 ng/L    Comment: (NOTE) Elevated high sensitivity troponin I (hsTnI) values and significant  changes across serial measurements may suggest ACS but many other  chronic and acute conditions are known to elevate hsTnI results.  Refer to the "Links" section for chest pain algorithms and additional  guidance. Performed at [facility], [place], [place] [address]   Troponin I (High Sensitivity)     Status: Abnormal   Collection Time: 12/29/19 12:28 AM  Result Value Ref Range   Troponin I (High Sensitivity) 373 (HH) <18 ng/L    Comment: CRITICAL RESULT CALLED TO, READ BACK BY AND VERIFIED WITH SHANNON MARTIN AT 0118 ON 12/29/19 RWW (NOTE) Elevated high sensitivity troponin I (hsTnI) values and significant  changes across serial measurements may suggest ACS but many other  chronic and acute conditions are known to elevate hsTnI results.  Refer to the "Links" section for chest pain algorithms and additional  guidance. Performed at [facility], [address] [place], [place] [address]   Respiratory Panel by RT PCR (Flu A&B, Covid) - Nasopharyngeal Swab     Status: None   Collection Time: 12/29/19  1:34 AM   Specimen: Nasopharyngeal Swab  Result Value Ref Range   SARS Coronavirus 2 by RT PCR NEGATIVE NEGATIVE    Comment: (NOTE) SARS-CoV-2 target nucleic acids are NOT DETECTED. The SARS-CoV-2  RNA is generally detectable in upper respiratoy specimens during the acute phase of infection. The lowest concentration of SARS-CoV-2 viral copies this assay can detect is 131 copies/mL. A negative result does not preclude SARS-Cov-2 infection and should not be used as the sole basis for treatment or other patient management decisions. A negative result may occur with  improper specimen collection/handling, submission of specimen other than nasopharyngeal swab, presence  of viral mutation(s) within the areas targeted by this assay, and inadequate number of viral copies (<131 copies/mL). A negative result must be combined with clinical observations, patient history, and epidemiological information. The expected result is Negative. Fact Sheet for Patients:  PinkCheek.be Fact Sheet for Healthcare Providers:  GravelBags.it This test is not yet ap proved or cleared by the [place] FDA and  has been authorized for detection and/or diagnosis of SARS-CoV-2 by FDA under an Emergency Use Authorization (EUA). This EUA will remain  in effect (meaning this test can be used) for the duration of the COVID-19 declaration under Section 564(b)(1) of the Act, 21 U.S.C. section 360bbb-3(b)(1), unless the authorization is terminated or revoked sooner.    Influenza A by PCR NEGATIVE NEGATIVE   Influenza B by PCR NEGATIVE NEGATIVE    Comment: (NOTE) The Xpert Xpress SARS-CoV-2/FLU/RSV assay is intended as an aid in  the diagnosis of influenza from Nasopharyngeal swab specimens and  should not be used as a sole basis for treatment. Nasal washings and  aspirates are unacceptable for Xpert Xpress SARS-CoV-2/FLU/RSV  testing. Fact Sheet for Patients: PinkCheek.be Fact Sheet for Healthcare Providers: GravelBags.it This test is not yet approved or cleared by the [place] FDA and  has  been authorized for detection and/or diagnosis of SARS-CoV-2 by  FDA under an Emergency Use Authorization (EUA). This EUA will remain  in effect (meaning this test can be used) for the duration of the  Covid-19 declaration under Section 564(b)(1) of the Act, 21  U.S.C. section 360bbb-3(b)(1), unless the authorization is  terminated or revoked. Performed at [facility], [address] [place], [place] [address]   APTT     Status: None   Collection Time: 12/29/19  2:09 AM  Result Value Ref Range   aPTT 34 24 - 36 seconds    Comment: Performed at [facility], [address] [place], [place] [address]  Protime-INR     Status: None   Collection Time: 12/29/19  2:09 AM  Result Value Ref Range   Prothrombin Time 12.6 11.4 - 15.2 seconds   INR 1.0 0.8 - 1.2    Comment: (NOTE) INR goal varies based on device and disease states. Performed at [facility], [address] [place], [place] [address]   Basic metabolic panel     Status: Abnormal   Collection Time: 12/29/19  4:40 AM  Result Value Ref Range   Sodium 140 135 - 145 mmol/L   Potassium 4.5 3.5 - 5.1 mmol/L   Chloride 110 98 - 111 mmol/L   CO2 19 (L) 22 - 32 mmol/L   Glucose, Bld 248 (H) 70 - 99 mg/dL   BUN 42 (H) 8 - 23 mg/dL   Creatinine, Ser 1.67 (H) 0.61 - 1.24 mg/dL   Calcium 8.9 8.9 - 10.3 mg/dL   GFR calc non Af Amer 38 (L) >60 mL/min   GFR calc Af Amer 44 (L) >60 mL/min   Anion gap 11 5 - 15    Comment: Performed at [facility], [address] [place],  [address]  Lipid panel     Status: Abnormal   Collection Time: 12/29/19  4:40 AM  Result Value Ref Range   Cholesterol 115 0 - 200 mg/dL   Triglycerides 163 (H) <150 mg/dL   HDL 31 (L) >40 mg/dL   Total CHOL/HDL Ratio 3.7 RATIO   VLDL 33 0 - 40 mg/dL   LDL Cholesterol 51 0 - 99  mg/dL    Comment:        Total Cholesterol/HDL:CHD Risk Coronary Heart Disease Risk Table                     Men   Women  1/2 Average  Risk   3.4   3.3  Average Risk       5.0   4.4  2 X Average Risk   9.6   7.1  3 X Average Risk  23.4   11.0        Use the calculated Patient Ratio above and the CHD Risk Table to determine the patient's CHD Risk.        ATP III CLASSIFICATION (LDL):  <100     mg/dL   Optimal  100-129  mg/dL   Near or Above                    Optimal  130-159  mg/dL   Borderline  160-189  mg/dL   High  >190     mg/dL   Very High Performed at [facility], [address] [place], [place] [address]   CBC     Status: Abnormal   Collection Time: 12/29/19  4:40 AM  Result Value Ref Range   WBC 10.9 (H) 4.0 - 10.5 K/uL   RBC 3.72 (L) 4.22 - 5.81 MIL/uL   Hemoglobin 11.1 (L) 13.0 - 17.0 g/dL   HCT 34.7 (L) 39.0 - 52.0 %   MCV 93.3 80.0 - 100.0 fL   MCH 29.8 26.0 - 34.0 pg   MCHC 32.0 30.0 - 36.0 g/dL   RDW 14.6 11.5 - 15.5 %   Platelets 270 150 - 400 K/uL   nRBC 0.0 0.0 - 0.2 %    Comment: Performed at [facility], [address], [place], [place] [address]  Ferritin     Status: None   Collection Time: 12/29/19  4:40 AM  Result Value Ref Range   Ferritin 68 24 - 336 ng/mL    Comment: Performed at [facility], [address] [place], [place] [address]  Lactate dehydrogenase     Status: None   Collection Time: 12/29/19  4:40 AM  Result Value Ref Range   LDH 176 98 - 192 U/L    Comment: Performed at [facility], [address] [place], [place] [address]  Troponin I (High Sensitivity)     Status: Abnormal   Collection Time: 12/29/19  4:40 AM  Result Value Ref Range   Troponin I (High Sensitivity) 2,426 (HH) <18 ng/L    Comment: CRITICAL VALUE NOTED. VALUE IS CONSISTENT WITH PREVIOUSLY REPORTED/CALLED VALUE  SDR (NOTE) Elevated high sensitivity troponin I (hsTnI) values and significant  changes across serial measurements may suggest ACS but many other  chronic and acute conditions are known to elevate hsTnI results.  Refer to the "Links" section  for chest pain algorithms and additional  guidance. Performed at [facility], [address] [place], [place] [address]   Glucose, capillary     Status: Abnormal   Collection Time: 12/29/19  8:01 AM  Result Value Ref Range   Glucose-Capillary 176 (H) 70 - 99 mg/dL  Heparin level (unfractionated)     Status: None   Collection Time: 12/29/19 10:39 AM  Result Value Ref Range   Heparin Unfractionated 0.37 0.30 - 0.70 IU/mL    Comment: (NOTE) If heparin results are below expected values, and patient dosage  has  been confirmed, suggest follow up testing of antithrombin III levels. Performed at [facility], [address] [place], [place] [address]   Glucose, capillary     Status: Abnormal   Collection Time: 12/29/19 11:50 AM  Result Value Ref Range   Glucose-Capillary 181 (H) 70 - 99 mg/dL  ECHOCARDIOGRAM COMPLETE     Status: None   Collection Time: 12/29/19 12:03 PM  Result Value Ref Range   Weight 3,158.4 oz   Height 66 in   BP 183/64 mmHg  POCT Activated clotting time     Status: None   Collection Time: 12/29/19  2:16 PM  Result Value Ref Range   Activated Clotting Time 257 seconds  Glucose, capillary     Status: Abnormal   Collection Time: 12/29/19  6:10 PM  Result Value Ref Range   Glucose-Capillary 251 (H) 70 - 99 mg/dL  Glucose, capillary     Status: Abnormal   Collection Time: 12/29/19  8:38 PM  Result Value Ref Range   Glucose-Capillary 255 (H) 70 - 99 mg/dL  Glucose, capillary     Status: Abnormal   Collection Time: 12/29/19 10:46 PM  Result Value Ref Range   Glucose-Capillary 224 (H) 70 - 99 mg/dL  Glucose, capillary     Status: Abnormal   Collection Time: 12/30/19  4:40 AM  Result Value Ref Range   Glucose-Capillary 68 (L) 70 - 99 mg/dL  CBC     Status: Abnormal   Collection Time: 12/30/19  4:55 AM  Result Value Ref Range   WBC 9.1 4.0 - 10.5 K/uL   RBC 3.72 (L) 4.22 - 5.81 MIL/uL   Hemoglobin 11.1 (L) 13.0 - 17.0 g/dL   HCT 35.3  (L) 39.0 - 52.0 %   MCV 94.9 80.0 - 100.0 fL   MCH 29.8 26.0 - 34.0 pg   MCHC 31.4 30.0 - 36.0 g/dL   RDW 14.6 11.5 - 15.5 %   Platelets 266 150 - 400 K/uL   nRBC 0.0 0.0 - 0.2 %    Comment: Performed at [facility], [address] [place], [place] [address]  Basic metabolic panel     Status: Abnormal   Collection Time: 12/30/19  4:55 AM  Result Value Ref Range   Sodium 139 135 - 145 mmol/L   Potassium 4.2 3.5 - 5.1 mmol/L   Chloride 109 98 - 111 mmol/L   CO2 22 22 - 32 mmol/L   Glucose, Bld 88 70 - 99 mg/dL   BUN 32 (H) 8 - 23 mg/dL   Creatinine, Ser 1.48 (H) 0.61 - 1.24 mg/dL   Calcium 8.6 (L) 8.9 - 10.3 mg/dL   GFR calc non Af Amer 44 (L) >60 mL/min   GFR calc Af Amer 51 (L) >60 mL/min   Anion gap 8 5 - 15    Comment: Performed at [facility], [address] [place], [place] [address]  Glucose, capillary     Status: None   Collection Time: 12/30/19  6:07 AM  Result Value Ref Range   Glucose-Capillary 95 70 - 99 mg/dL  Glucose, capillary     Status: Abnormal   Collection Time: 12/30/19  8:43 AM  Result Value Ref Range   Glucose-Capillary 109 (H) 70 - 99 mg/dL  Glucose, capillary     Status: Abnormal   Collection Time: 12/30/19 11:32 AM  Result Value Ref Range   Glucose-Capillary 199 (H) 70 - 99 mg/dL  Brain natriuretic peptide  Status: Abnormal   Collection Time: 01/04/20  3:03 PM  Result Value Ref Range   B Natriuretic Peptide 219.0 (H) 0.0 - 100.0 pg/mL    Comment: Performed at [facility], [address] [place], [place] [address]  Basic metabolic panel     Status: Abnormal   Collection Time: 01/04/20  3:03 PM  Result Value Ref Range   Sodium 138 135 - 145 mmol/L   Potassium 5.2 (H) 3.5 - 5.1 mmol/L   Chloride 108 98 - 111 mmol/L   CO2 21 (L) 22 - 32 mmol/L   Glucose, Bld 85 70 - 99 mg/dL   BUN 44 (H) 8 - 23 mg/dL   Creatinine, Ser 1.93 (H) 0.61 - 1.24 mg/dL   Calcium 9.2 8.9 - 10.3 mg/dL   GFR calc non Af Amer 32 (L) >60  mL/min   GFR calc Af Amer 37 (L) >60 mL/min   Anion gap 9 5 - 15    Comment: Performed at [facility], [address] [place], [place] [address]  Brain natriuretic peptide     Status: Abnormal   Collection Time: 01/06/20 11:27 AM  Result Value Ref Range   B Natriuretic Peptide 210.0 (H) 0.0 - 100.0 pg/mL    Comment: Performed at [facility], [address], [place], [place] [address]    Radiology: No results found.  No results found.  DG Chest 2 View  Result Date: 12/28/2019 CLINICAL DATA:  Chest pain. EXAM: CHEST - 2 VIEW COMPARISON:  Radiograph 12/21/2019, CT 12/11/2018 FINDINGS: The cardiomediastinal contours are normal. Bilateral subpleural opacities have improved from prior exam. Pulmonary vasculature is normal. No consolidation, pleural effusion, or pneumothorax. No acute osseous abnormalities are seen. IMPRESSION: No acute chest findings. Improving bilateral subpleural opacities. Electronically Signed   By: Keith Rake M.D.   On: 12/28/2019 22:45   CARDIAC CATHETERIZATION  Result Date: 12/29/2019 Conclusions: 1. Multivessel coronary artery disease, including diffuse mid LAD disease of up to 50% and 70-80% apical LAD stenosis, 80-90% focal proximal LCx lesion, 50% OM1 stenosis, and 40% mid and distal RCA lesions. 2. Widely patent proximal RCA stent. 3. Mildly elevated left ventricular filling pressure. 4. Successful PCI to proximal LCx using Resolute Onyx 2.5 x 15 mm drug-eluting stent (postdilated to 2.8 mm) with 0% residual stenosis and TIMI-3 flow. Recommendations: 1. Continue dual antiplatelet therapy with aspirin and clopidogrel for at least 12 months, ideally longer. 2. Aggressive secondary prevention and medical management of residual coronary artery disease that appears stable compared with most recent catheterization in 2019. Nelva Bush, MD [facility]   ECHOCARDIOGRAM COMPLETE  Result Date: 12/29/2019   ECHOCARDIOGRAM REPORT   Patient  Name:   NOLAWI KANADY Date of Exam: 12/29/2019 Medical Rec #:  [id number]            Height:       66.0 in Accession #:    [id number]           Weight:       197.4 lb Date of Birth:  1941-11-10            BSA:          1.99 m Patient Age:    21 years             BP:           183/64 mmHg Patient Gender: M  HR:           51 bpm. Exam Location:  [facility] Procedure: 2D Echo, Color Doppler and Cardiac Doppler Indications:     I21.4 NSTEMI  History:         Patient has prior history of Echocardiogram examinations, most                  recent 12/14/2019. CHF, PVD; Risk Factors:Hypertension, Diabetes                  and Dyslipidemia.  Sonographer:     Charmayne Sheer RDCS (AE) Referring Phys:  [id number] Arvella Merles Cucumber Diagnosing Phys: Bartholome Bill MD  Sonographer Comments: Suboptimal subcostal window. IMPRESSIONS  1. Left ventricular ejection fraction, by visual estimation, is 60 to 65%. The left ventricle has normal function. Left ventricular septal wall thickness was mildly increased. Mildly increased left ventricular posterior wall thickness. There is mildly increased left ventricular hypertrophy.  2. The left ventricle has no regional wall motion abnormalities.  3. Global right ventricle has normal systolic function.The right ventricular size is normal. No increase in right ventricular wall thickness.  4. Left atrial size was mildly dilated.  5. Right atrial size was normal.  6. The mitral valve is grossly normal. No evidence of mitral valve regurgitation.  7. The tricuspid valve is not well visualized.  8. The tricuspid valve is not well visualized. Tricuspid valve regurgitation is trivial.  9. The aortic valve is tricuspid. Aortic valve regurgitation is trivial. Mild aortic valve sclerosis without stenosis. 10. The pulmonic valve was not well visualized. Pulmonic valve regurgitation is trivial. 11. The aortic root was not well visualized. 12. The interatrial septum was not assessed. FINDINGS  Left Ventricle:  Left ventricular ejection fraction, by visual estimation, is 60 to 65%. The left ventricle has normal function. The left ventricle has no regional wall motion abnormalities. Mildly increased left ventricular posterior wall thickness. There is mildly increased left ventricular hypertrophy. Right Ventricle: The right ventricular size is normal. No increase in right ventricular wall thickness. Global RV systolic function is has normal systolic function. Left Atrium: Left atrial size was mildly dilated. Right Atrium: Right atrial size was normal in size Pericardium: There is no evidence of pericardial effusion. Mitral Valve: The mitral valve is grossly normal. No evidence of mitral valve regurgitation. MV peak gradient, 4.9 mmHg. Tricuspid Valve: The tricuspid valve is not well visualized. Tricuspid valve regurgitation is trivial. Aortic Valve: The aortic valve is tricuspid. Aortic valve regurgitation is trivial. Mild aortic valve sclerosis is present, with no evidence of aortic valve stenosis. Aortic valve mean gradient measures 10.0 mmHg. Aortic valve peak gradient measures 19.1  mmHg. Aortic valve area, by VTI measures 1.73 cm. Pulmonic Valve: The pulmonic valve was not well visualized. Pulmonic valve regurgitation is trivial. Pulmonic regurgitation is trivial. Aorta: The aortic root was not well visualized. IAS/Shunts: The interatrial septum was not assessed.  LEFT VENTRICLE PLAX 2D LVIDd:         3.98 cm  Diastology LVIDs:         2.61 cm  LV e' lateral:   7.29 cm/s LV PW:         1.08 cm  LV E/e' lateral: 10.9 LV IVS:        1.05 cm  LV e' medial:    6.20 cm/s LVOT diam:     2.30 cm  LV E/e' medial:  12.8 LV SV:         44  ml LV SV Index:   21.39 LVOT Area:     4.15 cm  RIGHT VENTRICLE RV Basal diam:  3.27 cm LEFT ATRIUM             Index       RIGHT ATRIUM          Index LA diam:        4.70 cm 2.36 cm/m  RA Area:     8.89 cm LA Vol (A2C):   61.4 ml 30.87 ml/m RA Volume:   15.50 ml 7.79 ml/m LA Vol (A4C):    71.3 ml 35.85 ml/m LA Biplane Vol: 66.6 ml 33.49 ml/m  AORTIC VALVE                    PULMONIC VALVE AV Area (Vmax):    1.72 cm     PV Vmax:       1.32 m/s AV Area (Vmean):   1.86 cm     PV Vmean:      84.200 cm/s AV Area (VTI):     1.73 cm     PV VTI:        0.258 m AV Vmax:           218.50 cm/s  PV Peak grad:  7.0 mmHg AV Vmean:          149.500 cm/s PV Mean grad:  3.0 mmHg AV VTI:            0.458 m AV Peak Grad:      19.1 mmHg AV Mean Grad:      10.0 mmHg LVOT Vmax:         90.20 cm/s LVOT Vmean:        67.000 cm/s LVOT VTI:          0.191 m LVOT/AV VTI ratio: 0.42  AORTA Ao Root diam: 2.80 cm MITRAL VALVE MV Area (PHT): 2.66 cm             SHUNTS MV Peak grad:  4.9 mmHg             Systemic VTI:  0.19 m MV Mean grad:  1.0 mmHg             Systemic Diam: 2.30 cm MV Vmax:       1.11 m/s MV Vmean:      52.2 cm/s MV VTI:        0.38 m MV PHT:        82.65 msec MV Decel Time: 285 msec MV E velocity: 79.50 cm/s 103 cm/s MV A velocity: 73.70 cm/s 70.3 cm/s MV E/A ratio:  1.08       1.5  Bartholome Bill MD Electronically signed by Bartholome Bill MD Signature Date/Time: 12/29/2019/1:04:34 PM    Final       Assessment and Plan: Patient Active Problem List   Diagnosis Date Noted  . Lobar pneumonia ([place])   . Type II diabetes mellitus with renal manifestations ([place]) 12/14/2019  . Elevated troponin   . DM type 2 with diabetic peripheral neuropathy ([place]) 01/25/2019  . CKD (chronic kidney disease) stage 4, GFR 15-29 ml/min ([facility]) 01/21/2019  . Gout 01/21/2019  . MR (mitral regurgitation) 01/21/2019  . COPD (chronic obstructive pulmonary disease) ([place]) 01/08/2019  . OSA (obstructive sleep apnea) 01/08/2019  . Chronic diastolic heart failure ([place]) 12/30/2018  . Polyp of sigmoid colon   . Benign neoplasm of ascending colon   . Hemorrhoids without complication   .  Diverticulosis of large intestine without diverticulitis   . Reflux esophagitis   . Lymphangiectasia   . Arteriovenous malformation of duodenum    . Symptomatic anemia   . SOB (shortness of breath)   . Iron deficiency anemia due to chronic blood loss   . Unstable angina ([place])   . Anemia of chronic renal failure, stage 4 (severe) ([place])   . Chest pain 11/14/2018  . Sciatica 11/12/2018  . CAD (coronary artery disease) 02/27/2018  . Sacroiliac joint pain 10/08/2017  . Pain in limb 09/24/2017  . Neuropathy 07/24/2017  . NSTEMI (non-ST elevated myocardial infarction) ([place]) 05/06/2017  . PAD (peripheral artery disease) ([place]) 05/01/2017  . History of stroke 01/02/2017  . Barrett's esophagus 10/02/2016  . Carotid stenosis 10/02/2016  . Essential hypertension 10/02/2016  . Hyperlipidemia 10/02/2016  . Diabetes ([place]) 10/02/2016  . 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 Uncontrolled type 2 diabetes mellitus with hyperglycemia, with long-term current use of insulin ([place]) 04/10/2016  . TIA (transient ischemic attack) 10/08/2015    1. CHF had a CT scan done in January which showed GGO and basilar changes. Not sure if this could have been presence of CHF at the time. Since that time he has been treated for CHF and also had a stent placed for the CAD. 2. NSTEMI patient will be managed by his primary cardiologist he sees Dr. Rebecka Apley.  He had a stent placement done with the 95% stenosis of the left circumflex we will continue to monitor. 3. OSA encourage good compliance and current therapy and supportive care. 4. CKD Stage 4 we will continue to monitor the renal function. 5. Asthma/COPD spirometry was done we will continue to follow along.  General Counseling: I have discussed the findings of the evaluation and examination with Marland Kitchen.  I have also discussed any further diagnostic evaluation thatmay be needed or ordered today. Gabrien verbalizes understanding of the findings of todays visit. We also reviewed his medications today and discussed drug interactions and side effects including but not limited excessive  drowsiness and altered mental states. We also discussed that there is always a risk not just to him but also people around him. he has been encouraged to call the office with any questions or concerns that should arise related to todays visit.  Orders Placed This Encounter  Procedures  . Spirometry with Graph    Order Specific Question:   Where should this test be performed?    Answer:   [facility]     Time spent: 30  I have personally obtained a history, examined the patient, evaluated laboratory and imaging results, formulated the assessment and plan and placed orders.    Allyne Gee, MD [facility] Sleep medicine

## 2020-01-21 NOTE — Patient Instructions (Signed)
Heart Failure and Exercise Heart failure is a condition in which the heart does not fill or pump enough blood and oxygen to support your body and its functions. Heart failure is a long-term (chronic) condition. Living with heart failure can be challenging. However, following your health care provider's instructions about a healthy lifestyle may help improve your symptoms. This includes choosing the right exercise plan. Doing daily physical activity is important after a diagnosis of heart failure. You may have some activity restrictions, so talk to your health care provider before doing any exercises. What are the benefits of exercise? Exercise may:  Make your heart muscles stronger.  Lower your blood pressure.  Lower your cholesterol.  Help you lose weight.  Help your bones stay strong.  Improve your blood circulation.  Help your body use oxygen better. This relieves symptoms such as fatigue and shortness of breath.  Help your mental health by lowering the risk of depression and other problems.  Improve your quality of life.  Decrease your chance of hospital admission for heart failure. What is an exercise plan? An exercise plan is a set of specific exercises and training activities. You will work with your health care provider to create the exercise plan that works for you. The plan may include:  Different types of exercises and how to do them.  Cardiac rehabilitation exercises. These are supervised programs that are designed to strengthen your heart. What are strengthening exercises? Strengthening exercises are a type of physical activity that involves using resistance to improve your muscle strength. Strengthening exercises usually have repetitive motions. These types of exercises can include:  Lifting weights.  Using weight machines.  Using resistance tubes and bands.  Using kettlebells.  Using your body weight, such as doing push-ups or squats. What are balance  exercises? Balance exercises are another type of physical activity. They strengthen the muscles of the back, stomach, and pelvis (core muscles) and improve your balance. They can also lower your risk of falling. These types of exercises can include:  Standing on one leg.  Walking backward, sideways, and in a straight line.  Standing up after sitting, without using your hands.  Shifting your weight from one leg to the other.  Lifting one leg in front of you.  Doing tai chi. This is a type of exercise that uses slow movements and deep breathing. How can I increase my flexibility? Having better flexibility can keep you from falling. It can also lengthen your muscles, improve your range of motion, and help your joints. You can increase your flexibility by:  Doing tai chi.  Doing yoga.  Stretching. How much aerobic exercise should I get?  Aerobic exercises strengthen your breathing and circulation system and increase your body's use of oxygen. Examples of aerobic exercise include biking, walking, running, and swimming. Talk to your health care provider to find out how much aerobic exercise is safe for you.  To do these exercises:  Start exercising slowly, limiting the amount of time at first. You may need to start with 5 minutes of aerobic exercise every day.  Slowly add more minutes until you can safely do at least 30 minutes of exercise at least 4 days a week. Summary  Daily physical activity is important after a diagnosis of heart failure.  Exercise can make your heart muscles stronger. It also offers other benefits that will improve your health.  Talk to your health care provider before doing any exercises. This information is not intended to replace advice  given to you by your health care provider. Make sure you discuss any questions you have with your health care provider. Document Revised: 04/12/2017 Document Reviewed: 04/09/2017 Elsevier Patient Education  [place].

## 2020-02-04 DIAGNOSIS — R8290 Unspecified abnormal findings in urine: Secondary | ICD-10-CM | POA: Diagnosis not present

## 2020-02-04 DIAGNOSIS — E875 Hyperkalemia: Secondary | ICD-10-CM | POA: Diagnosis not present

## 2020-02-04 DIAGNOSIS — E1122 Type 2 diabetes mellitus with diabetic chronic kidney disease: Secondary | ICD-10-CM | POA: Diagnosis not present

## 2020-02-04 DIAGNOSIS — D631 Anemia in chronic kidney disease: Secondary | ICD-10-CM | POA: Diagnosis not present

## 2020-02-04 DIAGNOSIS — N184 Chronic kidney disease, stage 4 (severe): Secondary | ICD-10-CM | POA: Diagnosis not present

## 2020-02-04 DIAGNOSIS — I129 Hypertensive chronic kidney disease with stage 1 through stage 4 chronic kidney disease, or unspecified chronic kidney disease: Secondary | ICD-10-CM | POA: Diagnosis not present

## 2020-02-04 DIAGNOSIS — N2581 Secondary hyperparathyroidism of renal origin: Secondary | ICD-10-CM | POA: Diagnosis not present

## 2020-02-10 DIAGNOSIS — E872 Acidosis: Secondary | ICD-10-CM | POA: Diagnosis not present

## 2020-02-10 DIAGNOSIS — E875 Hyperkalemia: Secondary | ICD-10-CM | POA: Diagnosis not present

## 2020-02-10 DIAGNOSIS — D631 Anemia in chronic kidney disease: Secondary | ICD-10-CM | POA: Diagnosis not present

## 2020-02-10 DIAGNOSIS — I129 Hypertensive chronic kidney disease with stage 1 through stage 4 chronic kidney disease, or unspecified chronic kidney disease: Secondary | ICD-10-CM | POA: Diagnosis not present

## 2020-02-10 DIAGNOSIS — E1122 Type 2 diabetes mellitus with diabetic chronic kidney disease: Secondary | ICD-10-CM | POA: Diagnosis not present

## 2020-02-10 DIAGNOSIS — N184 Chronic kidney disease, stage 4 (severe): Secondary | ICD-10-CM | POA: Diagnosis not present

## 2020-02-10 DIAGNOSIS — N2581 Secondary hyperparathyroidism of renal origin: Secondary | ICD-10-CM | POA: Diagnosis not present

## 2020-02-16 DIAGNOSIS — I252 Old myocardial infarction: Secondary | ICD-10-CM | POA: Diagnosis not present

## 2020-02-16 DIAGNOSIS — I43 Cardiomyopathy in diseases classified elsewhere: Secondary | ICD-10-CM | POA: Diagnosis not present

## 2020-02-16 DIAGNOSIS — I119 Hypertensive heart disease without heart failure: Secondary | ICD-10-CM | POA: Diagnosis not present

## 2020-02-16 DIAGNOSIS — E119 Type 2 diabetes mellitus without complications: Secondary | ICD-10-CM | POA: Diagnosis not present

## 2020-03-02 NOTE — Progress Notes (Signed)
Mike Decker ID: Mike Decker, male    DOB: [birth year], 79 y.o.   MRN: [id number]  HPI  Mike Decker is a 79 y/o male with a history of DM, anemia, hyperlipidemia, HTN, obstructive sleep apnea, PVD, previous tobacco use and chronic heart failure.   Echo report from 12/29/19 reviewed and showed an EF of 60-65% along with trivial TR/PR. Echo report from 11/15/18 reviewed and showed an EF of 55-60% along with mild AS and mild/moderate Mike.   Cardiac catheterization done 12/29/19 showed: 1. Multivessel coronary artery disease, including diffuse mid LAD disease of up to 50% and 70-80% apical LAD stenosis, 80-90% focal proximal LCx lesion, 50% OM1 stenosis, and 40% mid and distal RCA lesions. 2. Widely patent proximal RCA stent. 3. Mildly elevated left ventricular filling pressure. 4. Successful PCI to proximal LCx using Resolute Onyx 2.5 x 15 mm drug-eluting stent (postdilated to 2.8 mm) with 0% residual stenosis and TIMI-3 flow.  Cardiac catheterization done 12/08/18 showed:   Successful cardiac cath with mild to moderate diffuse coronary disease throughout No significant high-grade obstructive coronary disease Mike Decker appears to have possible microvascular disease LV gram was deferred because of renal insufficiency  Admitted 12/28/19 due to NSTEMI. Cardiology consult obtained. Mike Decker started on heparin and catheterization done. DES placed in left circumflex. Discharged after 2 days. Admitted 12/14/19 due to atypical chest pain and acute on chronic HF. Cardiology consult obtained. Elevated troponin thought to be due to demand ischemia. Initially needed IV lasix and then transitioned to oral diuretics. Discharged after 3 days.   Mike Decker presents today for a follow-up visit with a chief complaint of moderate shortness of breath upon moderate exertion. Mike Decker describes this as chronic in nature having been present for several years. Mike Decker was able to walk to our office from the [place] entrance although was quite  winded when Mike Decker arrived but quickly recovered. Mike Decker has associated cough, right lower leg swelling and chronic difficulty sleeping along with this. Mike Decker denies any dizziness, abdominal distention, palpitations, chest pain, fatigue or weight gain.   Past Medical History:  Diagnosis Date  . Anemia   . Arthritis   . Atrioventricular canal (AVC)    irregular heart beats  . Barrett esophagus   . 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 Chronic diastolic CHF (congestive heart failure) ([place])   . Colon polyp   . Diabetes mellitus without complication ([place])   . Diverticulosis   . Gout   . Heart disease   . Hemangioma    liver  . Hyperlipidemia   . Hypertension   . Myocardial infarct ([place])   . Ocular hypertension   . Peripheral vascular disease ([place])   . Skin cancer   . Sleep apnea   . Vitreoretinal degeneration    Past Surgical History:  Procedure Laterality Date  . CATARACT EXTRACTION  2011, 2012  . COLONOSCOPY N/A 12/14/2018   Procedure: COLONOSCOPY;  Surgeon: Virgel Manifold, MD;  Location: [facility] ENDOSCOPY;  Service: Endoscopy;  Laterality: N/A;  . CORONARY ANGIOPLASTY WITH STENT PLACEMENT  2012  . CORONARY STENT INTERVENTION N/A 12/29/2019   Procedure: CORONARY STENT INTERVENTION;  Surgeon: Nelva Bush, MD;  Location: [place] [facility];  Service: Cardiovascular;  Laterality: N/A;  . ESOPHAGOGASTRODUODENOSCOPY N/A 12/14/2018   Procedure: ESOPHAGOGASTRODUODENOSCOPY (EGD);  Surgeon: Virgel Manifold, MD;  Location: [facility] ENDOSCOPY;  Service: Endoscopy;  Laterality: N/A;  . ESOPHAGOGASTRODUODENOSCOPY (EGD) WITH PROPOFOL N/A 06/01/2016   Procedure: ESOPHAGOGASTRODUODENOSCOPY (EGD) WITH PROPOFOL;  Surgeon: Herbie Baltimore  Federico Flake, MD;  Location: [facility] ENDOSCOPY;  Service: Endoscopy;  Laterality: N/A;  . HERNIA REPAIR     x2  . INTRAOCULAR LENS INSERTION    . LEFT HEART CATH AND CORONARY ANGIOGRAPHY N/A 05/09/2017   Procedure: Left Heart Cath and Coronary Angiography;  Surgeon: Yolonda Kida, MD;  Location: [place] [facility];  Service: Cardiovascular;  Laterality: N/A;  . LEFT HEART CATH AND CORONARY ANGIOGRAPHY N/A 12/08/2018   Procedure: LEFT HEART CATH AND CORONARY ANGIOGRAPHY and possible PCI and stent;  Surgeon: Yolonda Kida, MD;  Location: [place] [facility];  Service: Cardiovascular;  Laterality: N/A;  . LEFT HEART CATH AND CORONARY ANGIOGRAPHY N/A 12/29/2019   Procedure: LEFT HEART CATH AND CORONARY ANGIOGRAPHY;  Surgeon: Nelva Bush, MD;  Location: [place] [facility];  Service: Cardiovascular;  Laterality: N/A;  . NOSE SURGERY     submucous resection  . PROSTATE SURGERY  2002  . ROTATOR CUFF REPAIR Right    Family History  Problem Relation Age of Onset  . Arthritis Mother   . Stroke Maternal Grandfather   . Breast cancer Neg Hx    Social History   Tobacco Use  . Smoking status: Former Smoker    Years: 20.00    Types: Cigarettes    Quit date: 12/10/1976    Years since quitting: 43.2  . Smokeless tobacco: Never Used  Substance Use Topics  . Alcohol use: No   Allergies  Allergen Reactions  . Gabapentin     Other reaction(s): Other (See Comments) Tremors  . Peanut-Containing Drug Products Anaphylaxis  . Penicillins Hives and Rash    Has Mike Decker had a PCN reaction causing immediate rash, facial/tongue/throat swelling, SOB or lightheadedness with hypotension: Yes Has Mike Decker had a PCN reaction causing severe rash involving mucus membranes or skin necrosis: No Has Mike Decker had a PCN reaction that required hospitalization No Has Mike Decker had a PCN reaction occurring within the last 10 years: No If all of the above answers are "NO", then may proceed with Cephalosporin use.  . Bee Venom Swelling  . Influenza Vaccines Hives  . Inh [Isoniazid] Hives  . Kenalog [Triamcinolone Acetonide] Hives  . Levaquin [Levofloxacin] Other (See Comments)    Tendon, ligament pain.   . Nalfon [Fenoprofen Calcium] Hives  . Naproxen   . Peanut Oil   .  Nsaids Rash    Nalfon 600 Nalfon 600   Prior to Admission medications   Medication Sig Start Date End Date Taking? Authorizing Provider  acetaminophen (TYLENOL) 500 MG tablet Take 1,000 mg by mouth every 6 (six) hours as needed for mild pain, moderate pain or headache.   Yes [provider]  allopurinol (ZYLOPRIM) 100 MG tablet Take 100 mg by mouth daily.    Yes [provider]  amLODipine (NORVASC) 5 MG tablet Take 5 mg by mouth 2 (two) times daily.  12/31/18  Yes [provider]  aspirin EC 81 MG EC tablet Take 1 tablet (81 mg total) by mouth daily. 12/18/19  Yes Amin, Jeanella Flattery, MD  budesonide-formoterol (SYMBICORT) 160-4.5 MCG/ACT inhaler Inhale 2 puffs into the lungs 2 (two) times daily as needed.    Yes [provider]  cetirizine (ZYRTEC) 5 MG tablet Take 5 mg by mouth daily as needed for allergies or rhinitis.    Yes [provider]  citalopram (CELEXA) 10 MG tablet Take 10 mg by mouth daily.   Yes [provider]  clopidogrel (PLAVIX) 75 MG tablet Take  1 tablet (75 mg total) by mouth daily. 12/30/19 12/24/20 Yes Enzo Bi, MD  colchicine 0.6 MG tablet Take 3 tablets (1.8 mg total) by mouth daily as needed (gout flares). 12/30/19  Yes Enzo Bi, MD  dextromethorphan-guaiFENesin [facility] DM) 30-600 MG 12hr tablet Take 1 tablet by mouth 2 (two) times daily as needed for cough.   Yes [provider]  EPINEPHrine (EPI-PEN) 0.3 mg/0.3 mL SOAJ injection Inject into the muscle as needed (anaphylaxis).    Yes [provider]  ferrous sulfate 325 (65 FE) MG EC tablet Take 1 tablet (325 mg total) by mouth daily. 11/10/19  Yes Earlie Server, MD  fluticasone [facility]) 50 MCG/ACT nasal spray Place 2 sprays into both nostrils 2 (two) times daily.  11/04/18  Yes [provider]  folic acid (V-R FOLIC ACID) 081 MCG tablet Take 1 tablet (400 mcg total) by mouth daily. 12/29/19  Yes Earlie Server, MD  furosemide (LASIX) 20 MG tablet Take 2  tablets (40 mg total) by mouth daily. 11/17/18  Yes Bettey Costa, MD  hydrALAZINE (APRESOLINE) 25 MG tablet Take 1 tablet (25 mg total) by mouth every 8 (eight) hours. Mike Decker taking differently: Take 25 mg by mouth 3 (three) times daily.  12/17/19  Yes Amin, Ankit Chirag, MD  insulin glargine (LANTUS) 100 UNIT/ML injection Inject 0.35 mLs (35 Units total) into the skin at bedtime. This is a decrease from your previous 45 units nightly. 12/30/19  Yes Enzo Bi, MD  isosorbide mononitrate (IMDUR) 60 MG 24 hr tablet Take 60 mg by mouth daily.  01/28/19  Yes [provider]  Magnesium 250 MG TABS Take 250 mg by mouth daily.   Yes [provider]  methocarbamol (ROBAXIN) 500 MG tablet Take 500 mg by mouth 2 (two) times daily.  11/08/18  Yes [provider]  metoprolol tartrate (LOPRESSOR) 50 MG tablet Take 25 mg by mouth 3 (three) times daily.    Yes [provider]  montelukast (SINGULAIR) 10 MG tablet TAKE 1 TABLET BY MOUTH EVERYDAY AT BEDTIME 12/14/19  Yes [place], Audie Clear, NP  nitroGLYCERIN (NITROSTAT) 0.4 MG SL tablet Place 0.4 mg under the tongue every 5 (five) minutes x 3 doses as needed for chest pain.    Yes [provider]  Omega-3 Fatty Acids (FISH OIL) 1000 MG CAPS Take 1 capsule by mouth daily.    Yes [provider]  pantoprazole (PROTONIX) 40 MG tablet Take 1 tablet (40 mg total) by mouth 2 (two) times daily before a meal. 12/17/19  Yes Amin, Ankit Chirag, MD  rosuvastatin (CRESTOR) 40 MG tablet Take 40 mg by mouth every evening.    Yes [provider]  sacubitril-valsartan (ENTRESTO) 24-26 MG Take 1 tablet by mouth 2 (two) times daily. 12/17/19  Yes Amin, Ankit Chirag, MD  sodium bicarbonate 650 MG tablet Take 650 mg by mouth 2 (two) times daily.   Yes [provider]  vitamin C (ASCORBIC ACID) 250 MG tablet Take 250 mg by mouth daily.   Yes [provider]  Vitamin D, Ergocalciferol, (DRISDOL) 50000 units CAPS capsule  Take 50,000 Units by mouth every 30 (thirty) days.    Yes [provider]  zinc gluconate 50 MG tablet Take 50 mg by mouth daily.   Yes [provider]  ipratropium-albuterol (DUONEB) 0.5-2.5 (3) MG/3ML SOLN Take 3 mLs by nebulization every 4 (four) hours as needed. Mike Decker not taking: Reported on 03/03/2020 05/15/17   Hillary Bow, MD  Review of Systems  Constitutional: Negative for appetite change and fatigue.  HENT: Positive for congestion. Negative for postnasal drip and sore throat.   Eyes: Negative.   Respiratory: Positive for cough and shortness of breath. Negative for chest tightness.   Cardiovascular: Positive for leg swelling (right lower leg). Negative for chest pain and palpitations.  Gastrointestinal: Negative for abdominal distention and abdominal pain.  Endocrine: Negative.   Genitourinary: Negative.   Musculoskeletal: Negative for back pain and neck pain.  Skin: Negative.   Allergic/Immunologic: Negative.   Neurological: Positive for weakness. Negative for dizziness and light-headedness.  Hematological: Negative for adenopathy. Does not bruise/bleed easily.  Psychiatric/Behavioral: Positive for sleep disturbance (wearing CPAP; sleeping on 1 pillow). Negative for dysphoric mood. The Mike Decker is not nervous/anxious.    Vitals:   03/03/20 1339 03/03/20 1354  BP: (!) 161/47 (!) 150/60  Pulse: 70   Resp: 18   SpO2: 98%   Weight: 207 lb 2 oz (94 kg)   Height: 5\' 6"  (1.676 m)    Wt Readings from Last 3 Encounters:  03/03/20 207 lb 2 oz (94 kg)  01/21/20 205 lb (93 kg)  01/04/20 202 lb (91.6 kg)   Lab Results  Component Value Date   CREATININE 1.93 (H) 01/04/2020   CREATININE 1.48 (H) 12/30/2019   CREATININE 1.67 (H) 12/29/2019    Physical Exam Vitals and nursing note reviewed.  Constitutional:      Appearance: Normal appearance.  HENT:     Head: Normocephalic and atraumatic.  Cardiovascular:     Rate and Rhythm: Normal rate and regular  rhythm.  Pulmonary:     Effort: Pulmonary effort is normal.     Breath sounds: Normal breath sounds. No wheezing or rales.  Abdominal:     General: There is no distension.     Palpations: Abdomen is soft.  Musculoskeletal:        General: No tenderness.     Cervical back: Normal range of motion and neck supple.     Right lower leg: Edema (trace pitting) present.     Left lower leg: No edema.  Skin:    General: Skin is warm and dry.  Neurological:     General: No focal deficit present.     Mental Status: Mike Decker is alert and oriented to person, place, and time.  Psychiatric:        Mood and Affect: Mood normal.        Behavior: Behavior normal.    Assessment & Plan:  1: Chronic heart failure with preserved ejection fraction- - NYHA Decker II - euvolemic today - weighing daily and Mike Decker was reminded to call for an overnight weight gain of >2 pounds or a weekly weight gain of >5 pounds - weight up 5 pounds since last visit here 2 months ago - adds salt to his food when Mike Decker cooks but doesn't add any after. Doesn't like the taste of Mrs. Dash. Reviewed the importance of closely following a 2000mg  sodium diet  - saw cardiology Clayborn Bigness) 01/06/20  - had decided to not participate in cardiac rehab due to his concerns with COVID; encouraged him to be as active as possible  - BNP 01/06/20 was 210.0 - Mike Decker doesn't take the flu vaccine due to allergy; encouraged diligent handwashing - encouraged him to get compression socks and put them on every morning with removal at bedtime  2: HTN- - BP mildly elevated upon walking to our office but was improving upon recheck - saw PCP (  Masoud) two weeks ago - BMP from 02/04/20 reviewed and showed sodium 139, potassium 5.4, creatinine 2.09 and GFR 29  3: DM with CKD- - glucose this morning at home was 112  - saw endocrinologist (Solum) 01/18/20 - A1c on 12/15/19 was 8.7% - saw nephrologist (Kolluru) 02/10/20  4: Obstructive sleep apnea- - wears CPAP nightly but  says that Mike Decker doesn't sleep well although does say that Mike Decker sleeps from about 4am-noon - worked night shift for 12 years - saw pulmonologist Humphrey Rolls) 01/21/20   Medication bottles were reviewed.   Due to stability of HF, will not make a return appointment for Mike Decker at this time. Advised Mike Decker that Mike Decker could call back at anytime to schedule another appointment and Mike Decker was comfortable with this plan.

## 2020-03-03 ENCOUNTER — Ambulatory Visit: Payer: Medicare Other | Attending: Family | Admitting: Family

## 2020-03-03 ENCOUNTER — Other Ambulatory Visit: Payer: Self-pay

## 2020-03-03 ENCOUNTER — Encounter: Payer: Self-pay | Admitting: Family

## 2020-03-03 VITALS — BP 150/60 | HR 70 | Resp 18 | Ht 66.0 in | Wt 207.1 lb

## 2020-03-03 DIAGNOSIS — N189 Chronic kidney disease, unspecified: Secondary | ICD-10-CM | POA: Insufficient documentation

## 2020-03-03 DIAGNOSIS — M109 Gout, unspecified: Secondary | ICD-10-CM | POA: Diagnosis not present

## 2020-03-03 DIAGNOSIS — Z8261 Family history of arthritis: Secondary | ICD-10-CM | POA: Insufficient documentation

## 2020-03-03 DIAGNOSIS — I10 Essential (primary) hypertension: Secondary | ICD-10-CM

## 2020-03-03 DIAGNOSIS — Z85828 Personal history of other malignant neoplasm of skin: Secondary | ICD-10-CM | POA: Diagnosis not present

## 2020-03-03 DIAGNOSIS — Z79899 Other long term (current) drug therapy: Secondary | ICD-10-CM | POA: Diagnosis not present

## 2020-03-03 DIAGNOSIS — Z7902 Long term (current) use of antithrombotics/antiplatelets: Secondary | ICD-10-CM | POA: Insufficient documentation

## 2020-03-03 DIAGNOSIS — E785 Hyperlipidemia, unspecified: Secondary | ICD-10-CM | POA: Diagnosis not present

## 2020-03-03 DIAGNOSIS — Z86010 Personal history of colonic polyps: Secondary | ICD-10-CM | POA: Diagnosis not present

## 2020-03-03 DIAGNOSIS — Z8501 Personal history of malignant neoplasm of esophagus: Secondary | ICD-10-CM | POA: Insufficient documentation

## 2020-03-03 DIAGNOSIS — Z887 Allergy status to serum and vaccine status: Secondary | ICD-10-CM | POA: Insufficient documentation

## 2020-03-03 DIAGNOSIS — Z7951 Long term (current) use of inhaled steroids: Secondary | ICD-10-CM | POA: Insufficient documentation

## 2020-03-03 DIAGNOSIS — I2510 Atherosclerotic heart disease of native coronary artery without angina pectoris: Secondary | ICD-10-CM | POA: Insufficient documentation

## 2020-03-03 DIAGNOSIS — Z7982 Long term (current) use of aspirin: Secondary | ICD-10-CM | POA: Diagnosis not present

## 2020-03-03 DIAGNOSIS — E1122 Type 2 diabetes mellitus with diabetic chronic kidney disease: Secondary | ICD-10-CM | POA: Diagnosis not present

## 2020-03-03 DIAGNOSIS — I130 Hypertensive heart and chronic kidney disease with heart failure and stage 1 through stage 4 chronic kidney disease, or unspecified chronic kidney disease: Secondary | ICD-10-CM | POA: Diagnosis not present

## 2020-03-03 DIAGNOSIS — Z8546 Personal history of malignant neoplasm of prostate: Secondary | ICD-10-CM | POA: Insufficient documentation

## 2020-03-03 DIAGNOSIS — Z888 Allergy status to other drugs, medicaments and biological substances status: Secondary | ICD-10-CM | POA: Diagnosis not present

## 2020-03-03 DIAGNOSIS — G4733 Obstructive sleep apnea (adult) (pediatric): Secondary | ICD-10-CM | POA: Insufficient documentation

## 2020-03-03 DIAGNOSIS — M1990 Unspecified osteoarthritis, unspecified site: Secondary | ICD-10-CM | POA: Diagnosis not present

## 2020-03-03 DIAGNOSIS — Z87891 Personal history of nicotine dependence: Secondary | ICD-10-CM | POA: Diagnosis not present

## 2020-03-03 DIAGNOSIS — I252 Old myocardial infarction: Secondary | ICD-10-CM | POA: Diagnosis not present

## 2020-03-03 DIAGNOSIS — Z880 Allergy status to penicillin: Secondary | ICD-10-CM | POA: Diagnosis not present

## 2020-03-03 DIAGNOSIS — Z881 Allergy status to other antibiotic agents status: Secondary | ICD-10-CM | POA: Diagnosis not present

## 2020-03-03 DIAGNOSIS — Z886 Allergy status to analgesic agent status: Secondary | ICD-10-CM | POA: Diagnosis not present

## 2020-03-03 DIAGNOSIS — N1832 Chronic kidney disease, stage 3b: Secondary | ICD-10-CM

## 2020-03-03 DIAGNOSIS — Z955 Presence of coronary angioplasty implant and graft: Secondary | ICD-10-CM | POA: Insufficient documentation

## 2020-03-03 DIAGNOSIS — E119 Type 2 diabetes mellitus without complications: Secondary | ICD-10-CM | POA: Diagnosis present

## 2020-03-03 DIAGNOSIS — E1151 Type 2 diabetes mellitus with diabetic peripheral angiopathy without gangrene: Secondary | ICD-10-CM | POA: Insufficient documentation

## 2020-03-03 DIAGNOSIS — D649 Anemia, unspecified: Secondary | ICD-10-CM | POA: Insufficient documentation

## 2020-03-03 DIAGNOSIS — I5032 Chronic diastolic (congestive) heart failure: Secondary | ICD-10-CM | POA: Insufficient documentation

## 2020-03-03 DIAGNOSIS — Z794 Long term (current) use of insulin: Secondary | ICD-10-CM | POA: Insufficient documentation

## 2020-03-03 NOTE — Patient Instructions (Addendum)
Continue weighing daily and call for an overnight weight gain of > 2 pounds or a weekly weight gain of >5 pounds.   Call us in the future if you'd like to schedule another appointment 

## 2020-03-09 ENCOUNTER — Other Ambulatory Visit: Payer: Self-pay | Admitting: Adult Health

## 2020-03-22 DIAGNOSIS — M7550 Bursitis of unspecified shoulder: Secondary | ICD-10-CM | POA: Diagnosis not present

## 2020-03-22 DIAGNOSIS — I43 Cardiomyopathy in diseases classified elsewhere: Secondary | ICD-10-CM | POA: Diagnosis not present

## 2020-03-22 DIAGNOSIS — I119 Hypertensive heart disease without heart failure: Secondary | ICD-10-CM | POA: Diagnosis not present

## 2020-03-22 DIAGNOSIS — I209 Angina pectoris, unspecified: Secondary | ICD-10-CM | POA: Diagnosis not present

## 2020-04-06 DIAGNOSIS — N184 Chronic kidney disease, stage 4 (severe): Secondary | ICD-10-CM | POA: Diagnosis not present

## 2020-04-06 DIAGNOSIS — G4733 Obstructive sleep apnea (adult) (pediatric): Secondary | ICD-10-CM | POA: Diagnosis not present

## 2020-04-06 DIAGNOSIS — I700 Atherosclerosis of aorta: Secondary | ICD-10-CM | POA: Diagnosis not present

## 2020-04-06 DIAGNOSIS — I10 Essential (primary) hypertension: Secondary | ICD-10-CM | POA: Diagnosis not present

## 2020-04-06 DIAGNOSIS — E782 Mixed hyperlipidemia: Secondary | ICD-10-CM | POA: Diagnosis not present

## 2020-04-06 DIAGNOSIS — I5032 Chronic diastolic (congestive) heart failure: Secondary | ICD-10-CM | POA: Diagnosis not present

## 2020-04-06 DIAGNOSIS — I2510 Atherosclerotic heart disease of native coronary artery without angina pectoris: Secondary | ICD-10-CM | POA: Diagnosis not present

## 2020-04-06 DIAGNOSIS — E1151 Type 2 diabetes mellitus with diabetic peripheral angiopathy without gangrene: Secondary | ICD-10-CM | POA: Diagnosis not present

## 2020-04-06 DIAGNOSIS — J449 Chronic obstructive pulmonary disease, unspecified: Secondary | ICD-10-CM | POA: Diagnosis not present

## 2020-04-06 DIAGNOSIS — Z79899 Other long term (current) drug therapy: Secondary | ICD-10-CM | POA: Diagnosis not present

## 2020-04-06 DIAGNOSIS — I214 Non-ST elevation (NSTEMI) myocardial infarction: Secondary | ICD-10-CM | POA: Diagnosis not present

## 2020-04-06 DIAGNOSIS — R0602 Shortness of breath: Secondary | ICD-10-CM | POA: Diagnosis not present

## 2020-04-06 DIAGNOSIS — Z8673 Personal history of transient ischemic attack (TIA), and cerebral infarction without residual deficits: Secondary | ICD-10-CM | POA: Diagnosis not present

## 2020-04-18 ENCOUNTER — Emergency Department: Payer: Medicare Other

## 2020-04-18 ENCOUNTER — Other Ambulatory Visit: Payer: Self-pay

## 2020-04-18 ENCOUNTER — Emergency Department
Admission: EM | Admit: 2020-04-18 | Discharge: 2020-04-18 | Disposition: A | Discharge status: Home / Self Care | Payer: Medicare Other | Attending: Student | Admitting: Student

## 2020-04-18 DIAGNOSIS — E1122 Type 2 diabetes mellitus with diabetic chronic kidney disease: Secondary | ICD-10-CM | POA: Insufficient documentation

## 2020-04-18 DIAGNOSIS — R110 Nausea: Secondary | ICD-10-CM | POA: Insufficient documentation

## 2020-04-18 DIAGNOSIS — Z79899 Other long term (current) drug therapy: Secondary | ICD-10-CM | POA: Insufficient documentation

## 2020-04-18 DIAGNOSIS — R079 Chest pain, unspecified: Secondary | ICD-10-CM | POA: Diagnosis not present

## 2020-04-18 DIAGNOSIS — Z7982 Long term (current) use of aspirin: Secondary | ICD-10-CM | POA: Diagnosis not present

## 2020-04-18 DIAGNOSIS — E1140 Type 2 diabetes mellitus with diabetic neuropathy, unspecified: Secondary | ICD-10-CM | POA: Diagnosis not present

## 2020-04-18 DIAGNOSIS — Z87891 Personal history of nicotine dependence: Secondary | ICD-10-CM | POA: Insufficient documentation

## 2020-04-18 DIAGNOSIS — R0789 Other chest pain: Secondary | ICD-10-CM | POA: Diagnosis not present

## 2020-04-18 DIAGNOSIS — I959 Hypotension, unspecified: Secondary | ICD-10-CM | POA: Diagnosis not present

## 2020-04-18 DIAGNOSIS — I252 Old myocardial infarction: Secondary | ICD-10-CM | POA: Insufficient documentation

## 2020-04-18 DIAGNOSIS — Z955 Presence of coronary angioplasty implant and graft: Secondary | ICD-10-CM | POA: Insufficient documentation

## 2020-04-18 DIAGNOSIS — R0602 Shortness of breath: Secondary | ICD-10-CM | POA: Diagnosis not present

## 2020-04-18 DIAGNOSIS — I5032 Chronic diastolic (congestive) heart failure: Secondary | ICD-10-CM | POA: Insufficient documentation

## 2020-04-18 DIAGNOSIS — I130 Hypertensive heart and chronic kidney disease with heart failure and stage 1 through stage 4 chronic kidney disease, or unspecified chronic kidney disease: Secondary | ICD-10-CM | POA: Insufficient documentation

## 2020-04-18 DIAGNOSIS — Z8673 Personal history of transient ischemic attack (TIA), and cerebral infarction without residual deficits: Secondary | ICD-10-CM | POA: Insufficient documentation

## 2020-04-18 DIAGNOSIS — R42 Dizziness and giddiness: Secondary | ICD-10-CM | POA: Diagnosis not present

## 2020-04-18 DIAGNOSIS — Z8546 Personal history of malignant neoplasm of prostate: Secondary | ICD-10-CM | POA: Insufficient documentation

## 2020-04-18 DIAGNOSIS — Z794 Long term (current) use of insulin: Secondary | ICD-10-CM | POA: Insufficient documentation

## 2020-04-18 DIAGNOSIS — I2510 Atherosclerotic heart disease of native coronary artery without angina pectoris: Secondary | ICD-10-CM | POA: Diagnosis not present

## 2020-04-18 DIAGNOSIS — N184 Chronic kidney disease, stage 4 (severe): Secondary | ICD-10-CM | POA: Diagnosis not present

## 2020-04-18 DIAGNOSIS — Z91010 Allergy to peanuts: Secondary | ICD-10-CM | POA: Insufficient documentation

## 2020-04-18 DIAGNOSIS — I10 Essential (primary) hypertension: Secondary | ICD-10-CM | POA: Diagnosis not present

## 2020-04-18 LAB — COMPREHENSIVE METABOLIC PANEL
ALT: 12 U/L (ref 0–44)
AST: 19 U/L (ref 15–41)
Albumin: 4.3 g/dL (ref 3.5–5.0)
Alkaline Phosphatase: 65 U/L (ref 38–126)
Anion gap: 10 (ref 5–15)
BUN: 52 mg/dL — ABNORMAL HIGH (ref 8–23)
CO2: 19 mmol/L — ABNORMAL LOW (ref 22–32)
Calcium: 8.9 mg/dL (ref 8.9–10.3)
Chloride: 108 mmol/L (ref 98–111)
Creatinine, Ser: 1.91 mg/dL — ABNORMAL HIGH (ref 0.61–1.24)
GFR calc Af Amer: 38 mL/min — ABNORMAL LOW (ref >60)
GFR calc non Af Amer: 33 mL/min — ABNORMAL LOW (ref >60)
Glucose, Bld: 194 mg/dL — ABNORMAL HIGH (ref 70–99)
Potassium: 4.2 mmol/L (ref 3.5–5.1)
Sodium: 137 mmol/L (ref 135–145)
Total Bilirubin: 0.5 mg/dL (ref 0.3–1.2)
Total Protein: 7.4 g/dL (ref 6.5–8.1)

## 2020-04-18 LAB — TROPONIN I (HIGH SENSITIVITY)
Troponin I (High Sensitivity): 14 ng/L (ref <18)
Troponin I (High Sensitivity): 16 ng/L (ref <18)

## 2020-04-18 LAB — CBC
HCT: 38.1 % — ABNORMAL LOW (ref 39.0–52.0)
Hemoglobin: 12.6 g/dL — ABNORMAL LOW (ref 13.0–17.0)
MCH: 30.1 pg (ref 26.0–34.0)
MCHC: 33.1 g/dL (ref 30.0–36.0)
MCV: 91.1 fL (ref 80.0–100.0)
Platelets: 239 10*3/uL (ref 150–400)
RBC: 4.18 MIL/uL — ABNORMAL LOW (ref 4.22–5.81)
RDW: 14 % (ref 11.5–15.5)
WBC: 10.6 10*3/uL — ABNORMAL HIGH (ref 4.0–10.5)
nRBC: 0 % (ref 0.0–0.2)

## 2020-04-18 MED ORDER — MORPHINE SULFATE (PF) 4 MG/ML IV SOLN
4.0000 mg | Freq: Once | INTRAVENOUS | Status: AC
  Administered 2020-04-18: 4 mg via INTRAVENOUS
  Filled 2020-04-18: qty 1

## 2020-04-18 MED ORDER — ONDANSETRON HCL 4 MG/2ML IJ SOLN
4.0000 mg | Freq: Once | INTRAMUSCULAR | Status: AC
  Administered 2020-04-18: 4 mg via INTRAVENOUS
  Filled 2020-04-18: qty 2

## 2020-04-18 NOTE — ED Notes (Signed)
Pt on phone calling spouse to come and pick him up.

## 2020-04-18 NOTE — ED Provider Notes (Signed)
[facility] Emergency Department Provider Note  ____________________________________________   First MD Initiated Contact with Patient 04/18/20 [phone]     (approximate)  I have reviewed the triage vital signs and the nursing notes.  History  Chief Complaint Chest Pain    HPI Mike Decker is a 79 y.o. male history diastolic HF, CKD, CAD, NSTEMI status post stenting who presents to the emergency department for chest pain.  Patient states pain started yesterday, was mild at onset, but overnight and into the early morning pain became more severe.  Pain is central, feels like a pressure, 7/10 in severity.  No radiation.  Associated with some nausea, dizziness, shortness of breath.  Feels similar to prior episodes of cardiac pain.  Took ASA, as well as 3 nitroglycerin without improvement in his symptoms.   Past Medical Hx Past Medical History:  Diagnosis Date  . Anemia   . Arthritis   . Atrioventricular canal (AVC)    irregular heart beats  . Barrett esophagus   . 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 Chronic diastolic CHF (congestive heart failure) ([place])   . Colon polyp   . Diabetes mellitus without complication ([place])   . Diverticulosis   . Gout   . Heart disease   . Hemangioma    liver  . Hyperlipidemia   . Hypertension   . Myocardial infarct ([place])   . Ocular hypertension   . Peripheral vascular disease ([place])   . Skin cancer   . Sleep apnea   . Vitreoretinal degeneration     Problem List Patient Active Problem List   Diagnosis Date Noted  . Lobar pneumonia ([place])   . Type II diabetes mellitus with renal manifestations ([place]) 12/14/2019  . Elevated troponin   . DM type 2 with diabetic peripheral neuropathy ([place]) 01/25/2019  . CKD (chronic kidney disease) stage 4, GFR 15-29 ml/min (HCC) 01/21/2019  . Gout 01/21/2019  . MR (mitral regurgitation) 01/21/2019  . COPD (chronic obstructive pulmonary disease) ([place]) 01/08/2019  . OSA  (obstructive sleep apnea) 01/08/2019  . Chronic diastolic heart failure ([place]) 12/30/2018  . Polyp of sigmoid colon   . Benign neoplasm of ascending colon   . Hemorrhoids without complication   . Diverticulosis of large intestine without diverticulitis   . Reflux esophagitis   . Lymphangiectasia   . Arteriovenous malformation of duodenum   . Symptomatic anemia   . SOB (shortness of breath)   . Iron deficiency anemia due to chronic blood loss   . Unstable angina ([place])   . Anemia of chronic renal failure, stage 4 (severe) ([place])   . Chest pain 11/14/2018  . Sciatica 11/12/2018  . CAD (coronary artery disease) 02/27/2018  . Sacroiliac joint pain 10/08/2017  . Pain in limb 09/24/2017  . Neuropathy 07/24/2017  . NSTEMI (non-ST elevated myocardial infarction) ([place]) 05/06/2017  . PAD (peripheral artery disease) ([place]) 05/01/2017  . History of stroke 01/02/2017  . Barrett's esophagus 10/02/2016  . Carotid stenosis 10/02/2016  . Essential hypertension 10/02/2016  . Hyperlipidemia 10/02/2016  . Diabetes ([place]) 10/02/2016  . 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 Uncontrolled type 2 diabetes mellitus with hyperglycemia, with long-term current use of insulin ([place]) 04/10/2016  . TIA (transient ischemic attack) 10/08/2015    Past Surgical Hx Past Surgical History:  Procedure Laterality Date  . CATARACT EXTRACTION  2011, 2012  . COLONOSCOPY N/A 12/14/2018   Procedure: COLONOSCOPY;  Surgeon: Virgel Manifold, MD;  Location: [facility] ENDOSCOPY;  Service: Endoscopy;  Laterality: N/A;  . CORONARY ANGIOPLASTY WITH STENT PLACEMENT  2012  . CORONARY STENT INTERVENTION N/A 12/29/2019   Procedure: CORONARY STENT INTERVENTION;  Surgeon: Nelva Bush, MD;  Location: [place] CV LAB;  Service: Cardiovascular;  Laterality: N/A;  . ESOPHAGOGASTRODUODENOSCOPY N/A 12/14/2018   Procedure: ESOPHAGOGASTRODUODENOSCOPY (EGD);  Surgeon: Virgel Manifold, MD;  Location: [facility]  ENDOSCOPY;  Service: Endoscopy;  Laterality: N/A;  . ESOPHAGOGASTRODUODENOSCOPY (EGD) WITH PROPOFOL N/A 06/01/2016   Procedure: ESOPHAGOGASTRODUODENOSCOPY (EGD) WITH PROPOFOL;  Surgeon: Manya Silvas, MD;  Location: [facility] ENDOSCOPY;  Service: Endoscopy;  Laterality: N/A;  . HERNIA REPAIR     x2  . INTRAOCULAR LENS INSERTION    . LEFT HEART CATH AND CORONARY ANGIOGRAPHY N/A 05/09/2017   Procedure: Left Heart Cath and Coronary Angiography;  Surgeon: Yolonda Kida, MD;  Location: [place] CV LAB;  Service: Cardiovascular;  Laterality: N/A;  . LEFT HEART CATH AND CORONARY ANGIOGRAPHY N/A 12/08/2018   Procedure: LEFT HEART CATH AND CORONARY ANGIOGRAPHY and possible PCI and stent;  Surgeon: Yolonda Kida, MD;  Location: [place] [facility] LAB;  Service: Cardiovascular;  Laterality: N/A;  . LEFT HEART CATH AND CORONARY ANGIOGRAPHY N/A 12/29/2019   Procedure: LEFT HEART CATH AND CORONARY ANGIOGRAPHY;  Surgeon: Nelva Bush, MD;  Location: [place] CV LAB;  Service: Cardiovascular;  Laterality: N/A;  . NOSE SURGERY     submucous resection  . PROSTATE SURGERY  2002  . ROTATOR CUFF REPAIR Right     Medications Prior to Admission medications   Medication Sig Start Date End Date Taking? Authorizing Provider  acetaminophen (TYLENOL) 500 MG tablet Take 1,000 mg by mouth every 6 (six) hours as needed for mild pain, moderate pain or headache.    [provider]  allopurinol (ZYLOPRIM) 100 MG tablet Take 100 mg by mouth daily.     [provider]  amLODipine (NORVASC) 5 MG tablet Take 5 mg by mouth 2 (two) times daily.  12/31/18   [provider]  aspirin EC 81 MG EC tablet Take 1 tablet (81 mg total) by mouth daily. 12/18/19   Amin, Jeanella Flattery, MD  budesonide-formoterol (SYMBICORT) 160-4.5 MCG/ACT inhaler Inhale 2 puffs into the lungs 2 (two) times daily as needed.     [provider]  cetirizine (ZYRTEC) 5 MG tablet Take 5 mg by mouth daily as needed  for allergies or rhinitis.     [provider]  citalopram (CELEXA) 10 MG tablet Take 10 mg by mouth daily.    [provider]  clopidogrel (PLAVIX) 75 MG tablet Take 1 tablet (75 mg total) by mouth daily. 12/30/19 12/24/20  Enzo Bi, MD  colchicine 0.6 MG tablet Take 3 tablets (1.8 mg total) by mouth daily as needed (gout flares). 12/30/19   Enzo Bi, MD  dextromethorphan-guaiFENesin [facility] DM) 30-600 MG 12hr tablet Take 1 tablet by mouth 2 (two) times daily as needed for cough.    [provider]  EPINEPHrine (EPI-PEN) 0.3 mg/0.3 mL SOAJ injection Inject into the muscle as needed (anaphylaxis).     [provider]  ferrous sulfate 325 (65 FE) MG EC tablet Take 1 tablet (325 mg total) by mouth daily. 11/10/19   Earlie Server, MD  fluticasone (FLONASE) 50 MCG/ACT nasal spray Place 2 sprays into both nostrils 2 (two) times daily.  11/04/18   [provider]  folic acid (V-R FOLIC ACID) 250 MCG tablet Take 1 tablet (400 mcg total) by  mouth daily. 12/29/19   Earlie Server, MD  furosemide (LASIX) 20 MG tablet Take 2 tablets (40 mg total) by mouth daily. 11/17/18   Bettey Costa, MD  hydrALAZINE (APRESOLINE) 25 MG tablet Take 1 tablet (25 mg total) by mouth every 8 (eight) hours. Patient taking differently: Take 25 mg by mouth 3 (three) times daily.  12/17/19   Amin, Jeanella Flattery, MD  insulin glargine (LANTUS) 100 UNIT/ML injection Inject 0.35 mLs (35 Units total) into the skin at bedtime. This is a decrease from your previous 45 units nightly. 12/30/19   Enzo Bi, MD  ipratropium-albuterol (DUONEB) 0.5-2.5 (3) MG/3ML SOLN Take 3 mLs by nebulization every 4 (four) hours as needed. Patient not taking: Reported on 03/03/2020 05/15/17   Hillary Bow, MD  isosorbide mononitrate (IMDUR) 60 MG 24 hr tablet Take 60 mg by mouth daily.  01/28/19   [provider]  Magnesium 250 MG TABS Take 250 mg by mouth daily.    [provider]  methocarbamol (ROBAXIN) 500 MG  tablet Take 500 mg by mouth 2 (two) times daily.  11/08/18   [provider]  metoprolol tartrate (LOPRESSOR) 50 MG tablet Take 25 mg by mouth 3 (three) times daily.     [provider]  montelukast (SINGULAIR) 10 MG tablet TAKE 1 TABLET BY MOUTH EVERYDAY AT BEDTIME 03/09/20   [place]ar[place], Audie Clear, NP  nitroGLYCERIN (NITROSTAT) 0.4 MG SL tablet Place 0.4 mg under the tongue every 5 (five) minutes x 3 doses as needed for chest pain.     [provider]  Omega-3 Fatty Acids (FISH OIL) 1000 MG CAPS Take 1 capsule by mouth daily.     [provider]  pantoprazole (PROTONIX) 40 MG tablet Take 1 tablet (40 mg total) by mouth 2 (two) times daily before a meal. 12/17/19   Amin, Jeanella Flattery, MD  rosuvastatin (CRESTOR) 40 MG tablet Take 40 mg by mouth every evening.     [provider]  sacubitril-valsartan (ENTRESTO) 24-26 MG Take 1 tablet by mouth 2 (two) times daily. 12/17/19   Amin, Jeanella Flattery, MD  sodium bicarbonate 650 MG tablet Take 650 mg by mouth 2 (two) times daily.    [provider]  vitamin C (ASCORBIC ACID) 250 MG tablet Take 250 mg by mouth daily.    [provider]  Vitamin D, Ergocalciferol, (DRISDOL) 50000 units CAPS capsule Take 50,000 Units by mouth every 30 (thirty) days.     [provider]  zinc gluconate 50 MG tablet Take 50 mg by mouth daily.    [provider]    Allergies Gabapentin, Peanut-containing drug products, Penicillins, Bee venom, Influenza vaccines, Inh [isoniazid], Kenalog [triamcinolone acetonide], Levaquin [levofloxacin], Nalfon [fenoprofen calcium], Naproxen, Peanut oil, and Nsaids  Family Hx Family History  Problem Relation Age of Onset  . Arthritis Mother   . Stroke Maternal Grandfather   . Breast cancer Neg Hx     Social Hx Social History   Tobacco Use  . Smoking status: Former Smoker    Years: 20.00    Types: Cigarettes    Quit date: 12/10/1976    Years since quitting: 43.3  .  Smokeless tobacco: Never Used  Substance Use Topics  . Alcohol use: No  . Drug use: No     Review of Systems  Constitutional: Negative for fever. Negative for chills. Eyes: Negative for visual changes. ENT: Negative for sore throat. Cardiovascular: + for chest pain. Respiratory: Negative for shortness of breath. Gastrointestinal: Negative  for nausea. Negative for vomiting.  Genitourinary: Negative for dysuria. Musculoskeletal: Negative for leg swelling. Skin: Negative for rash. Neurological: Negative for headaches.   Physical Exam  Vital Signs: ED Triage Vitals  Enc Vitals Group     BP 04/18/20 0254 (!) 171/95     Pulse Rate 04/18/20 0254 74     Resp 04/18/20 0254 18     Temp 04/18/20 0254 98.6 F (37 C)     Temp Source 04/18/20 0254 Oral     SpO2 04/18/20 0254 97 %     Weight 04/18/20 0255 197 lb (89.4 kg)     Height 04/18/20 0255 5\' 6"  (1.676 m)     Head Circumference --      Peak Flow --      Pain Score 04/18/20 0255 7     Pain Loc --      Pain Edu? --      Excl. in [place]? --     Constitutional: Alert and oriented. Well appearing. NAD.  Head: Normocephalic. Atraumatic. Eyes: Conjunctivae clear. Sclera anicteric. Pupils equal and symmetric. Nose: No masses or lesions. No congestion or rhinorrhea. Mouth/Throat: Wearing mask.  Neck: No stridor. Trachea midline.  Cardiovascular: Normal rate, regular rhythm. Extremities well perfused. Respiratory: Normal respiratory effort.  Lungs CTAB. Gastrointestinal: Soft. Non-distended. Non-tender.  Genitourinary: Deferred. Musculoskeletal: No lower extremity edema. No deformities. Neurologic:  Normal speech and language. No gross focal or lateralizing neurologic deficits are appreciated.  Skin: Skin is warm, dry and intact. No rash noted. Psychiatric: Mood and affect are appropriate for situation.  EKG  Personally reviewed and interpreted by myself.   Date: 04/18/20 Time: 0252 Rate: 72 Rhythm: sinus Axis:  left Intervals: QRS 117 ms No STEMI    Radiology  Personally reviewed available imaging myself.   CXR - IMPRESSION:  No active cardiopulmonary disease.    Procedures  Procedure(s) performed (including critical care):  Procedures   Initial Impression / Assessment and Plan / MDM / ED Course  79 y.o. male who presents to the ED for chest pain, as above  Ddx: ACS, angina, GERD, pulmonary infection, pleurisy  Will plan for labs, EKG, imaging. Took full dose ASA prior to arrival.  Clinical Course as of Apr 18 713  Mon Apr 18, 2020  0612 Initial and delta troponin both negative. Pain improved w/ medication, as did BP w/ improvement in pain.  Remainder of labs without actionable derangements.  As such, feel patient stable for discharge with outpatient cardiology follow-up.  He feels comfortable with this plan.  Given return precautions.   [SM]    Clinical Course User Index [SM] Lilia Pro., MD     _______________________________   As part of my medical decision making I have reviewed available labs, radiology tests, reviewed old records/performed chart review.    Final Clinical Impression(s) / ED Diagnosis  Chest pain in adult    Note:  This document was prepared using Dragon voice recognition software and may include unintentional dictation errors.   Lilia Pro., MD 04/18/20 [phone]

## 2020-04-18 NOTE — ED Notes (Signed)
Report to bill, rn.  

## 2020-04-18 NOTE — Discharge Instructions (Signed)
Thank you for letting us take care of you in the emergency department today.  ° °Please continue to take any regular, prescribed medications.  ° °Please follow up with: °- Your cardiology doctor to review your ER visit and follow up on your symptoms.  ° °Please return to the ER for any new or worsening symptoms.  ° °

## 2020-04-18 NOTE — ED Triage Notes (Signed)
Pt with central chest pain, nausea since 2330. Pt took 324mg  asa at home. Pt with one inch of ntg paste to left chest. Pt states ntg has not helped pain. Pt with MI and stent placement in January.

## 2020-04-25 DIAGNOSIS — E1121 Type 2 diabetes mellitus with diabetic nephropathy: Secondary | ICD-10-CM | POA: Diagnosis not present

## 2020-04-25 DIAGNOSIS — E1129 Type 2 diabetes mellitus with other diabetic kidney complication: Secondary | ICD-10-CM | POA: Diagnosis not present

## 2020-04-25 DIAGNOSIS — N1832 Chronic kidney disease, stage 3b: Secondary | ICD-10-CM | POA: Diagnosis not present

## 2020-04-25 DIAGNOSIS — R809 Proteinuria, unspecified: Secondary | ICD-10-CM | POA: Diagnosis not present

## 2020-04-25 DIAGNOSIS — Z794 Long term (current) use of insulin: Secondary | ICD-10-CM | POA: Diagnosis not present

## 2020-04-25 DIAGNOSIS — E1165 Type 2 diabetes mellitus with hyperglycemia: Secondary | ICD-10-CM | POA: Diagnosis not present

## 2020-04-25 DIAGNOSIS — E1159 Type 2 diabetes mellitus with other circulatory complications: Secondary | ICD-10-CM | POA: Diagnosis not present

## 2020-04-25 DIAGNOSIS — E1142 Type 2 diabetes mellitus with diabetic polyneuropathy: Secondary | ICD-10-CM | POA: Diagnosis not present

## 2020-05-06 ENCOUNTER — Encounter (INDEPENDENT_AMBULATORY_CARE_PROVIDER_SITE_OTHER): Payer: Medicare Other

## 2020-05-06 ENCOUNTER — Ambulatory Visit (INDEPENDENT_AMBULATORY_CARE_PROVIDER_SITE_OTHER): Payer: Medicare Other | Admitting: Vascular Surgery

## 2020-05-23 DIAGNOSIS — D2271 Melanocytic nevi of right lower limb, including hip: Secondary | ICD-10-CM | POA: Diagnosis not present

## 2020-05-23 DIAGNOSIS — X32XXXA Exposure to sunlight, initial encounter: Secondary | ICD-10-CM | POA: Diagnosis not present

## 2020-05-23 DIAGNOSIS — Z85828 Personal history of other malignant neoplasm of skin: Secondary | ICD-10-CM | POA: Diagnosis not present

## 2020-05-23 DIAGNOSIS — D2262 Melanocytic nevi of left upper limb, including shoulder: Secondary | ICD-10-CM | POA: Diagnosis not present

## 2020-05-23 DIAGNOSIS — L821 Other seborrheic keratosis: Secondary | ICD-10-CM | POA: Diagnosis not present

## 2020-05-23 DIAGNOSIS — D225 Melanocytic nevi of trunk: Secondary | ICD-10-CM | POA: Diagnosis not present

## 2020-05-23 DIAGNOSIS — Z85820 Personal history of malignant melanoma of skin: Secondary | ICD-10-CM | POA: Diagnosis not present

## 2020-05-23 DIAGNOSIS — L570 Actinic keratosis: Secondary | ICD-10-CM | POA: Diagnosis not present

## 2020-05-24 ENCOUNTER — Other Ambulatory Visit: Payer: Self-pay

## 2020-05-24 ENCOUNTER — Inpatient Hospital Stay (HOSPITAL_BASED_OUTPATIENT_CLINIC_OR_DEPARTMENT_OTHER): Payer: Medicare Other | Admitting: Oncology

## 2020-05-24 ENCOUNTER — Inpatient Hospital Stay: Payer: Medicare Other | Attending: Oncology

## 2020-05-24 ENCOUNTER — Encounter: Payer: Self-pay | Admitting: Oncology

## 2020-05-24 VITALS — BP 123/74 | HR 60 | Temp 96.2°F | Resp 18 | Wt 203.4 lb

## 2020-05-24 DIAGNOSIS — D500 Iron deficiency anemia secondary to blood loss (chronic): Secondary | ICD-10-CM

## 2020-05-24 DIAGNOSIS — E538 Deficiency of other specified B group vitamins: Secondary | ICD-10-CM | POA: Insufficient documentation

## 2020-05-24 DIAGNOSIS — N1832 Chronic kidney disease, stage 3b: Secondary | ICD-10-CM

## 2020-05-24 DIAGNOSIS — D631 Anemia in chronic kidney disease: Secondary | ICD-10-CM | POA: Diagnosis present

## 2020-05-24 LAB — CBC WITH DIFFERENTIAL/PLATELET
Abs Immature Granulocytes: 0.05 10*3/uL (ref 0.00–0.07)
Basophils Absolute: 0.1 10*3/uL (ref 0.0–0.1)
Basophils Relative: 1 %
Eosinophils Absolute: 0.7 10*3/uL — ABNORMAL HIGH (ref 0.0–0.5)
Eosinophils Relative: 7 %
HCT: 36.6 % — ABNORMAL LOW (ref 39.0–52.0)
Hemoglobin: 12.4 g/dL — ABNORMAL LOW (ref 13.0–17.0)
Immature Granulocytes: 1 %
Lymphocytes Relative: 11 %
Lymphs Abs: 1 10*3/uL (ref 0.7–4.0)
MCH: 30.3 pg (ref 26.0–34.0)
MCHC: 33.9 g/dL (ref 30.0–36.0)
MCV: 89.5 fL (ref 80.0–100.0)
Monocytes Absolute: 0.8 10*3/uL (ref 0.1–1.0)
Monocytes Relative: 9 %
Neutro Abs: 6.9 10*3/uL (ref 1.7–7.7)
Neutrophils Relative %: 71 %
Platelets: 229 10*3/uL (ref 150–400)
RBC: 4.09 MIL/uL — ABNORMAL LOW (ref 4.22–5.81)
RDW: 13.8 % (ref 11.5–15.5)
WBC: 9.5 10*3/uL (ref 4.0–10.5)
nRBC: 0 % (ref 0.0–0.2)

## 2020-05-24 LAB — IRON AND TIBC
Iron: 48 ug/dL (ref 45–182)
Saturation Ratios: 16 % — ABNORMAL LOW (ref 17.9–39.5)
TIBC: 293 ug/dL (ref 250–450)
UIBC: 245 ug/dL

## 2020-05-24 LAB — FERRITIN: Ferritin: 71 ng/mL (ref 24–336)

## 2020-05-24 LAB — VITAMIN B12: Vitamin B-12: 236 pg/mL (ref 180–914)

## 2020-05-24 MED ORDER — VITAMIN B-12 1000 MCG PO TABS
1000.0000 ug | ORAL_TABLET | Freq: Every day | ORAL | 3 refills | Status: DC
Start: 2020-05-24 — End: 2021-05-19

## 2020-05-24 NOTE — Progress Notes (Signed)
Patient is feeling weak for a while.

## 2020-05-24 NOTE — Progress Notes (Signed)
Hematology/Oncology Follow Up Note [facility]  [phone] [phone]) [phone]  Patient Care Team: Cletis Athens, MD as PCP - General (Cardiology) Cletis Athens, MD as Referring Physician (Cardiology) Christene Lye, MD (General Surgery) Alisa Graff, FNP as Nurse Practitioner (Family Medicine) Yolonda Kida, MD as Consulting Physician (Cardiology) Gabriel Carina Betsey Holiday, MD as Physician Assistant (Endocrinology) Lavonia Dana, MD as Consulting Physician (Nephrology) Allyne Gee, MD as Consulting Physician (Pulmonary Disease) [name], MD as Consulting Physician (Gastroenterology)   Name of the patient: Mike Decker  [id number]  09-14-1941   REASON FOR VISIT  follow-up for management of iron deficiency anemia, anemia and CKD, recent hospitalization   PERTINENT HEMATOLOGY Oncology HISTORY received IV Venofer daily x3 as well as multiple units of PRBC transfusion. GI was consulted and patient had EGD done during his hospitalization.  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  Treated with argon plasma coagulation.  Colonoscopy showed polyps which were removed. Patient was discharged on 12/15/2018.  # 03/23/2019 mammogram showed gynecomastia.  #History of cutaneous melanoma status post Mohs surgery. INTERVAL HISTORY 79 y.o. male with PMH listed as below present to follow-up for anemia of CKD.  He reports feeling well today.  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 95% stenosis in the left circumflex status post drug-eluting stent. Patient is on aspirin and Plavix prior to this cardiac event. 04/18/2020, patient has a ER visit due to chest pain. Troponin was negative. Pain improved with medication. Patient was discharged with outpatient cardiology follow-up.  Today he reports feeling well. No chest pain.  Denies any shortness of breath, abdominal pain. Denies any bloody or black stool.  Review of Systems  Constitutional: Negative for appetite change, chills, fatigue, fever and unexpected weight change.  HENT:   Negative for hearing loss and voice change.   Eyes: Negative for eye problems and icterus.  Respiratory: Negative for chest tightness, cough and shortness of breath.   Cardiovascular: Negative for chest pain and leg swelling.  Gastrointestinal: Negative for abdominal distention and abdominal pain.  Endocrine: Negative for hot flashes.  Genitourinary: Negative for difficulty urinating, dysuria and frequency.   Musculoskeletal: Negative for arthralgias.  Skin: Negative for itching and rash.  Neurological: Negative for light-headedness and numbness.  Hematological: Negative for adenopathy. Does not bruise/bleed easily.  Psychiatric/Behavioral: Negative for confusion.    Allergies  Allergen Reactions  . Gabapentin     Other reaction(s): Other (See Comments) Tremors  . Peanut-Containing Drug Products Anaphylaxis  . Penicillins Hives and Rash    Has patient had a PCN reaction causing immediate rash, facial/tongue/throat swelling, SOB or lightheadedness with hypotension: Yes Has patient had a PCN reaction causing severe rash involving mucus membranes or skin necrosis: No Has patient had a PCN reaction that required hospitalization No Has patient had a PCN reaction occurring within the last 10 years: No If all of the above answers are "NO", then may proceed with Cephalosporin use.  . Bee Venom Swelling  . Influenza Vaccines Hives  . Inh [Isoniazid] Hives  . Kenalog [Triamcinolone Acetonide] Hives  . Levaquin [Levofloxacin] Other (See Comments)    Tendon, ligament pain.   . Nalfon [Fenoprofen Calcium] Hives  . Naproxen   . Peanut Oil   . Nsaids Rash    Nalfon 600 Nalfon 600     Past Medical History:  Diagnosis Date  . Anemia   .  Arthritis   . Atrioventricular canal (AVC)     irregular heart beats  . Barrett esophagus   . 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 Chronic diastolic CHF (congestive heart failure) ([place])   . Colon polyp   . Diabetes mellitus without complication ([place])   . Diverticulosis   . Gout   . Heart disease   . Hemangioma    liver  . Hyperlipidemia   . Hypertension   . Myocardial infarct ([place])   . Ocular hypertension   . Peripheral vascular disease ([place])   . Skin cancer   . 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[place])   . Sleep apnea   . Vitreoretinal degeneration      Past Surgical History:  Procedure Laterality Date  . CATARACT EXTRACTION  2011, 2012  . COLONOSCOPY N/A 12/14/2018   Procedure: COLONOSCOPY;  Surgeon: Virgel Manifold, MD;  Location: [facility] ENDOSCOPY;  Service: Endoscopy;  Laterality: N/A;  . CORONARY ANGIOPLASTY WITH STENT PLACEMENT  2012  . CORONARY STENT INTERVENTION N/A 12/29/2019   Procedure: CORONARY STENT INTERVENTION;  Surgeon: Nelva Bush, MD;  Location: [place] [facility];  Service: Cardiovascular;  Laterality: N/A;  . ESOPHAGOGASTRODUODENOSCOPY N/A 12/14/2018   Procedure: ESOPHAGOGASTRODUODENOSCOPY (EGD);  Surgeon: Virgel Manifold, MD;  Location: [facility] ENDOSCOPY;  Service: Endoscopy;  Laterality: N/A;  . ESOPHAGOGASTRODUODENOSCOPY (EGD) WITH PROPOFOL N/A 06/01/2016   Procedure: ESOPHAGOGASTRODUODENOSCOPY (EGD) WITH PROPOFOL;  Surgeon: Manya Silvas, MD;  Location: [facility] ENDOSCOPY;  Service: Endoscopy;  Laterality: N/A;  . HERNIA REPAIR     x2  . INTRAOCULAR LENS INSERTION    . LEFT HEART CATH AND CORONARY ANGIOGRAPHY N/A 05/09/2017   Procedure: Left Heart Cath and Coronary Angiography;  Surgeon: Yolonda Kida, MD;  Location: [place] [facility];  Service: Cardiovascular;  Laterality: N/A;  . LEFT HEART CATH AND CORONARY ANGIOGRAPHY N/A 12/08/2018   Procedure: LEFT HEART CATH AND CORONARY ANGIOGRAPHY and possible PCI and stent;  Surgeon: Yolonda Kida, MD;  Location: [place] [facility];  Service:  Cardiovascular;  Laterality: N/A;  . LEFT HEART CATH AND CORONARY ANGIOGRAPHY N/A 12/29/2019   Procedure: LEFT HEART CATH AND CORONARY ANGIOGRAPHY;  Surgeon: Nelva Bush, MD;  Location: [place] [facility];  Service: Cardiovascular;  Laterality: N/A;  . NOSE SURGERY     submucous resection  . PROSTATE SURGERY  2002  . ROTATOR CUFF REPAIR Right     Social History   Socioeconomic History  . Marital status: Married    Spouse name: Not on file  . Number of children: Not on file  . Years of education: Not on file  . Highest education level: Not on file  Occupational History  . Not on file  Tobacco Use  . Smoking status: Former Smoker    Years: 20.00    Types: Cigarettes    Quit date: 12/10/1976    Years since quitting: 43.4  . Smokeless tobacco: Never Used  Substance and Sexual Activity  . Alcohol use: No  . Drug use: No  . Sexual activity: Not on file  Other Topics Concern  . Not on file  Social History Narrative  . Not on file   Social Determinants of Health   Financial Resource Strain:   . Difficulty of Paying Living Expenses:   Food Insecurity:   . Worried About Charity fundraiser in the Last Year:   . [place] in the Last Year:   Transportation Needs:   . Lack of  Transportation (Medical):   Marland Kitchen Lack of Transportation (Non-Medical):   Physical Activity:   . Days of Exercise per Week:   . Minutes of Exercise per Session:   Stress:   . Feeling of Stress :   Social Connections:   . Frequency of Communication with Friends and Family:   . Frequency of Social Gatherings with Friends and Family:   . Attends Religious Services:   . Active Member of Clubs or Organizations:   . Attends Archivist Meetings:   Marland Kitchen Marital Status:   Intimate Partner Violence:   . Fear of Current or Ex-Partner:   . Emotionally Abused:   Marland Kitchen Physically Abused:   . Sexually Abused:     Family History  Problem Relation Age of Onset  . Arthritis Mother   . Stroke  Maternal Grandfather   . Breast cancer Neg Hx      Current Outpatient Medications:  .  acetaminophen (TYLENOL) 500 MG tablet, Take 1,000 mg by mouth every 6 (six) hours as needed for mild pain, moderate pain or headache., Disp: , Rfl:  .  allopurinol (ZYLOPRIM) 100 MG tablet, Take 100 mg by mouth daily. , Disp: , Rfl:  .  amLODipine (NORVASC) 5 MG tablet, Take 5 mg by mouth 2 (two) times daily. , Disp: , Rfl:  .  aspirin EC 81 MG EC tablet, Take 1 tablet (81 mg total) by mouth daily., Disp: 60 tablet, Rfl: 0 .  budesonide-formoterol (SYMBICORT) 160-4.5 MCG/ACT inhaler, Inhale 2 puffs into the lungs 2 (two) times daily as needed. , Disp: , Rfl:  .  cetirizine (ZYRTEC) 5 MG tablet, Take 5 mg by mouth daily as needed for allergies or rhinitis. , Disp: , Rfl:  .  citalopram (CELEXA) 10 MG tablet, Take 10 mg by mouth daily., Disp: , Rfl:  .  clopidogrel (PLAVIX) 75 MG tablet, Take 1 tablet (75 mg total) by mouth daily., Disp: 30 tablet, Rfl: 11 .  colchicine 0.6 MG tablet, Take 3 tablets (1.8 mg total) by mouth daily as needed (gout flares)., Disp: , Rfl:  .  dextromethorphan-guaiFENesin (MUCINEX DM) 30-600 MG 12hr tablet, Take 1 tablet by mouth 2 (two) times daily as needed for cough., Disp: , Rfl:  .  EPINEPHrine (EPI-PEN) 0.3 mg/0.3 mL SOAJ injection, Inject into the muscle as needed (anaphylaxis). , Disp: , Rfl:  .  ferrous sulfate 325 (65 FE) MG EC tablet, Take 1 tablet (325 mg total) by mouth daily., Disp: 90 tablet, Rfl: 3 .  fluticasone (FLONASE) 50 MCG/ACT nasal spray, Place 2 sprays into both nostrils 2 (two) times daily. , Disp: , Rfl: 3 .  folic acid (V-R FOLIC ACID) 034 MCG tablet, Take 1 tablet (400 mcg total) by mouth daily., Disp: 90 tablet, Rfl: 3 .  furosemide (LASIX) 20 MG tablet, Take 2 tablets (40 mg total) by mouth daily., Disp: , Rfl:  .  hydrALAZINE (APRESOLINE) 25 MG tablet, Take 1 tablet (25 mg total) by mouth every 8 (eight) hours. (Patient taking differently: Take 25 mg by  mouth 3 (three) times daily. ), Disp: 60 tablet, Rfl: 0 .  insulin glargine (LANTUS) 100 UNIT/ML injection, Inject 0.35 mLs (35 Units total) into the skin at bedtime. This is a decrease from your previous 45 units nightly., Disp: 10 mL, Rfl: 11 .  isosorbide mononitrate (IMDUR) 60 MG 24 hr tablet, Take 60 mg by mouth daily. , Disp: , Rfl:  .  Magnesium 250 MG TABS, Take 250 mg by mouth  daily., Disp: , Rfl:  .  methocarbamol (ROBAXIN) 500 MG tablet, Take 500 mg by mouth 2 (two) times daily. , Disp: , Rfl: 0 .  metoprolol tartrate (LOPRESSOR) 50 MG tablet, Take 25 mg by mouth 3 (three) times daily. , Disp: , Rfl:  .  montelukast (SINGULAIR) 10 MG tablet, TAKE 1 TABLET BY MOUTH EVERYDAY AT BEDTIME, Disp: 90 tablet, Rfl: 1 .  nitroGLYCERIN (NITROSTAT) 0.4 MG SL tablet, Place 0.4 mg under the tongue every 5 (five) minutes x 3 doses as needed for chest pain. , Disp: , Rfl:  .  Omega-3 Fatty Acids (FISH OIL) 1000 MG CAPS, Take 1 capsule by mouth daily. , Disp: , Rfl:  .  pantoprazole (PROTONIX) 40 MG tablet, Take 1 tablet (40 mg total) by mouth 2 (two) times daily before a meal., Disp: , Rfl:  .  rosuvastatin (CRESTOR) 40 MG tablet, Take 40 mg by mouth every evening. , Disp: , Rfl:  .  sacubitril-valsartan (ENTRESTO) 24-26 MG, Take 1 tablet by mouth 2 (two) times daily., Disp: 60 tablet, Rfl: 0 .  sodium bicarbonate 650 MG tablet, Take 650 mg by mouth 2 (two) times daily., Disp: , Rfl:  .  vitamin C (ASCORBIC ACID) 250 MG tablet, Take 250 mg by mouth daily., Disp: , Rfl:  .  Vitamin D, Ergocalciferol, (DRISDOL) 50000 units CAPS capsule, Take 50,000 Units by mouth every 30 (thirty) days. , Disp: , Rfl: 1 .  zinc gluconate 50 MG tablet, Take 50 mg by mouth daily., Disp: , Rfl:  .  ipratropium-albuterol (DUONEB) 0.5-2.5 (3) MG/3ML SOLN, Take 3 mLs by nebulization every 4 (four) hours as needed. (Patient not taking: Reported on 03/03/2020), Disp: 360 mL, Rfl: 0  Physical exam:  Vitals:   05/24/20 1313  BP:  123/74  Pulse: 60  Resp: 18  Temp: (!) 96.2 F (35.7 C)  Weight: 203 lb 6.4 oz (92.3 kg)   Physical Exam Constitutional:      General: He is not in acute distress.    Appearance: He is obese.  HENT:     Head: Normocephalic and atraumatic.  Eyes:     General: No scleral icterus.    Pupils: Pupils are equal, round, and reactive to light.  Cardiovascular:     Rate and Rhythm: Normal rate and regular rhythm.     Heart sounds: Normal heart sounds.  Pulmonary:     Effort: Pulmonary effort is normal. No respiratory distress.     Breath sounds: No wheezing.  Abdominal:     General: Bowel sounds are normal. There is no distension.     Palpations: Abdomen is soft. There is no mass.     Tenderness: There is no abdominal tenderness.  Musculoskeletal:        General: No deformity. Normal range of motion.     Cervical back: Normal range of motion and neck supple.  Skin:    General: Skin is warm and dry.     Findings: No erythema or rash.  Neurological:     Mental Status: He is alert and oriented to person, place, and time. Mental status is at baseline.     Cranial Nerves: No cranial nerve deficit.     Coordination: Coordination normal.  Psychiatric:        Mood and Affect: Mood normal.     CMP Latest Ref Rng & Units 04/18/2020  Glucose 70 - 99 mg/dL 194(H)  BUN 8 - 23 mg/dL 52(H)  Creatinine 0.61 - 1.24  mg/dL 1.91(H)  Sodium 135 - 145 mmol/L 137  Potassium 3.5 - 5.1 mmol/L 4.2  Chloride 98 - 111 mmol/L 108  CO2 22 - 32 mmol/L 19(L)  Calcium 8.9 - 10.3 mg/dL 8.9  Total Protein 6.5 - 8.1 g/dL 7.4  Total Bilirubin 0.3 - 1.2 mg/dL 0.5  Alkaline Phos 38 - 126 U/L 65  AST 15 - 41 U/L 19  ALT 0 - 44 U/L 12   CBC Latest Ref Rng & Units 05/24/2020  WBC 4.0 - 10.5 K/uL 9.5  Hemoglobin 13.0 - 17.0 g/dL 12.4(L)  Hematocrit 39 - 52 % 36.6(L)  Platelets 150 - 400 K/uL 229    No results found.   Assessment and plan  1. Iron deficiency anemia due to chronic blood loss   2. B12  deficiency   3. Stage 3b chronic kidney disease    # Labs reviewed and discussed with the patient.  History of iron deficiency anemia, Hemoglobin remained stable.  Today's hemoglobin 12.4. Iron panel showed improved ferritin of 71, iron saturation borderline at 16. I recommend patient to continue take oral iron supplementation.  Continue vitamin C supplementation..  CKD, multiple myeloma panel showed no M spike,  Avoid nephrotoxin, Anemia of CKD, hemoglobin is above 10.  No intervention.  History of Vitamin B12 deficiency, negative intrinsic factor antibody and antiparital antibody.  Vitamin B12 level is 236.  Advised patient to restart vitamin B12 supplementation.  Follow up in 6 months   Orders Placed This Encounter  Procedures  . CBC with Differential/Platelet    Standing Status:   Future    Standing Expiration Date:   05/24/2021  . Ferritin    Standing Status:   Future    Standing Expiration Date:   05/24/2021  . Iron and TIBC    Standing Status:   Future    Standing Expiration Date:   05/24/2021  . Vitamin B12    Standing Status:   Future    Standing Expiration Date:   05/24/2021       Earlie Server, MD, PhD

## 2020-05-25 ENCOUNTER — Telehealth: Payer: Self-pay

## 2020-05-25 NOTE — Telephone Encounter (Signed)
-----   Message from Earlie Server, MD sent at 05/24/2020  7:49 PM EDT ----- Please let him know that vitamin b12 is within normal limits, but is on the low end. I recommend him to restart vitamin b12 supplementation. Rx has been sent to pharmacy.  Also continue oral iron supplementation.

## 2020-05-25 NOTE — Telephone Encounter (Signed)
Patient's wife, Webb Silversmith, notified of Dr. Collie Siad recommendations.

## 2020-05-27 ENCOUNTER — Ambulatory Visit (INDEPENDENT_AMBULATORY_CARE_PROVIDER_SITE_OTHER): Payer: Medicare Other | Admitting: Internal Medicine

## 2020-05-27 ENCOUNTER — Other Ambulatory Visit: Payer: Self-pay

## 2020-05-27 ENCOUNTER — Encounter: Payer: Self-pay | Admitting: Internal Medicine

## 2020-05-27 VITALS — BP 161/58 | HR 58 | Ht 66.0 in | Wt 203.2 lb

## 2020-05-27 DIAGNOSIS — H9203 Otalgia, bilateral: Secondary | ICD-10-CM | POA: Insufficient documentation

## 2020-05-27 DIAGNOSIS — N184 Chronic kidney disease, stage 4 (severe): Secondary | ICD-10-CM

## 2020-05-27 DIAGNOSIS — I5032 Chronic diastolic (congestive) heart failure: Secondary | ICD-10-CM | POA: Diagnosis not present

## 2020-05-27 DIAGNOSIS — J449 Chronic obstructive pulmonary disease, unspecified: Secondary | ICD-10-CM | POA: Diagnosis not present

## 2020-05-27 DIAGNOSIS — E1129 Type 2 diabetes mellitus with other diabetic kidney complication: Secondary | ICD-10-CM

## 2020-05-27 NOTE — Assessment & Plan Note (Signed)
Patient was found to have retracted drawn on the both side there is no evidence of any wax in the ears.  There is no evidence of otitis externa both drums were found to be retracted.

## 2020-05-27 NOTE — Assessment & Plan Note (Signed)
Patient was not found to have any wheezing on today's examination.

## 2020-05-27 NOTE — Progress Notes (Signed)
Established Patient Office Visit  SUBJECTIVE:  Patient ID: Mike Decker, male    DOB: 1941-12-08  Age: 79 y.o. MRN: [id number]  CC:  Chief Complaint  Patient presents with  . Ear Pain    Right ear pain x 2 days    HPI Mike Decker presents for significant ear pain.  He states that he has been having pain in his right ear for the last two days; he has not noticed any discharge from his ear. He does have some congestion with postnasal drip. He denies sinus pain. He had an ear infection years ago with a perforated ear drum and saw an ENT specialist; he states that he had both a bacterial and fungal infection in his ear. He is worried that he has a similar infection as the pain is similar.   He continues to see his hematologist, Dr. Earlie Server.  His blood sugar and blood pressure is stable with his current medications.   Past Medical History:  Diagnosis Date  . Anemia   . Arthritis   . Atrioventricular canal (AVC)    irregular heart beats  . Barrett esophagus   . 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 Chronic diastolic CHF (congestive heart failure) ([place])   . Colon polyp   . Diabetes mellitus without complication ([place])   . Diverticulosis   . Gout   . Heart disease   . Hemangioma    liver  . Hyperlipidemia   . Hypertension   . Myocardial infarct ([place])   . Ocular hypertension   . Peripheral vascular disease ([place])   . Skin cancer   . 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[place])   . Sleep apnea   . Vitreoretinal degeneration     Past Surgical History:  Procedure Laterality Date  . CATARACT EXTRACTION  2011, 2012  . COLONOSCOPY N/A 12/14/2018   Procedure: COLONOSCOPY;  Surgeon: Virgel Manifold, MD;  Location: [facility] ENDOSCOPY;  Service: Endoscopy;  Laterality: N/A;  . CORONARY ANGIOPLASTY WITH STENT PLACEMENT  2012  . CORONARY STENT INTERVENTION N/A 12/29/2019   Procedure: CORONARY STENT INTERVENTION;  Surgeon: Nelva Bush, MD;  Location: [place] [facility];  Service:  Cardiovascular;  Laterality: N/A;  . ESOPHAGOGASTRODUODENOSCOPY N/A 12/14/2018   Procedure: ESOPHAGOGASTRODUODENOSCOPY (EGD);  Surgeon: Virgel Manifold, MD;  Location: [facility] ENDOSCOPY;  Service: Endoscopy;  Laterality: N/A;  . ESOPHAGOGASTRODUODENOSCOPY (EGD) WITH PROPOFOL N/A 06/01/2016   Procedure: ESOPHAGOGASTRODUODENOSCOPY (EGD) WITH PROPOFOL;  Surgeon: Manya Silvas, MD;  Location: [facility] ENDOSCOPY;  Service: Endoscopy;  Laterality: N/A;  . HERNIA REPAIR     x2  . INTRAOCULAR LENS INSERTION    . LEFT HEART CATH AND CORONARY ANGIOGRAPHY N/A 05/09/2017   Procedure: Left Heart Cath and Coronary Angiography;  Surgeon: Yolonda Kida, MD;  Location: [place] [facility];  Service: Cardiovascular;  Laterality: N/A;  . LEFT HEART CATH AND CORONARY ANGIOGRAPHY N/A 12/08/2018   Procedure: LEFT HEART CATH AND CORONARY ANGIOGRAPHY and possible PCI and stent;  Surgeon: Yolonda Kida, MD;  Location: [place] [facility];  Service: Cardiovascular;  Laterality: N/A;  . LEFT HEART CATH AND CORONARY ANGIOGRAPHY N/A 12/29/2019   Procedure: LEFT HEART CATH AND CORONARY ANGIOGRAPHY;  Surgeon: Nelva Bush, MD;  Location: [place] [facility];  Service: Cardiovascular;  Laterality: N/A;  . NOSE SURGERY     submucous resection  . PROSTATE SURGERY  2002  . ROTATOR CUFF REPAIR Right     Family History  Problem Relation Age  of Onset  . Arthritis Mother   . Stroke Maternal Grandfather   . Breast cancer Neg Hx     Social History   Socioeconomic History  . Marital status: Married    Spouse name: Not on file  . Number of children: Not on file  . Years of education: Not on file  . Highest education level: Not on file  Occupational History  . Not on file  Tobacco Use  . Smoking status: Former Smoker    Years: 20.00    Types: Cigarettes    Quit date: 12/10/1976    Years since quitting: 43.4  . Smokeless tobacco: Never Used  Substance and Sexual Activity  . Alcohol use: No  . Drug use:  No  . Sexual activity: Not on file  Other Topics Concern  . Not on file  Social History Narrative  . Not on file   Social Determinants of Health   Financial Resource Strain:   . Difficulty of Paying Living Expenses:   Food Insecurity:   . Worried About Charity fundraiser in the Last Year:   . Arboriculturist in the Last Year:   Transportation Needs:   . Film/video editor (Medical):   Marland Kitchen Lack of Transportation (Non-Medical):   Physical Activity:   . Days of Exercise per Week:   . Minutes of Exercise per Session:   Stress:   . Feeling of Stress :   Social Connections:   . Frequency of Communication with Friends and Family:   . Frequency of Social Gatherings with Friends and Family:   . Attends Religious Services:   . Active Member of Clubs or Organizations:   . Attends Archivist Meetings:   Marland Kitchen Marital Status:   Intimate Partner Violence:   . Fear of Current or Ex-Partner:   . Emotionally Abused:   Marland Kitchen Physically Abused:   . Sexually Abused:      Current Outpatient Medications:  .  acetaminophen (TYLENOL) 500 MG tablet, Take 1,000 mg by mouth every 6 (six) hours as needed for mild pain, moderate pain or headache., Disp: , Rfl:  .  allopurinol (ZYLOPRIM) 100 MG tablet, Take 100 mg by mouth daily. , Disp: , Rfl:  .  amLODipine (NORVASC) 5 MG tablet, Take 5 mg by mouth 2 (two) times daily. , Disp: , Rfl:  .  aspirin EC 81 MG EC tablet, Take 1 tablet (81 mg total) by mouth daily., Disp: 60 tablet, Rfl: 0 .  budesonide-formoterol (SYMBICORT) 160-4.5 MCG/ACT inhaler, Inhale 2 puffs into the lungs 2 (two) times daily as needed. , Disp: , Rfl:  .  cetirizine (ZYRTEC) 5 MG tablet, Take 5 mg by mouth daily as needed for allergies or rhinitis. , Disp: , Rfl:  .  citalopram (CELEXA) 10 MG tablet, Take 10 mg by mouth daily., Disp: , Rfl:  .  clopidogrel (PLAVIX) 75 MG tablet, Take 1 tablet (75 mg total) by mouth daily., Disp: 30 tablet, Rfl: 11 .  colchicine 0.6 MG tablet,  Take 3 tablets (1.8 mg total) by mouth daily as needed (gout flares)., Disp: , Rfl:  .  dextromethorphan-guaiFENesin (MUCINEX DM) 30-600 MG 12hr tablet, Take 1 tablet by mouth 2 (two) times daily as needed for cough., Disp: , Rfl:  .  EPINEPHrine (EPI-PEN) 0.3 mg/0.3 mL SOAJ injection, Inject into the muscle as needed (anaphylaxis). , Disp: , Rfl:  .  ferrous sulfate 325 (65 FE) MG EC tablet, Take 1 tablet (325 mg  total) by mouth daily., Disp: 90 tablet, Rfl: 3 .  fluticasone (FLONASE) 50 MCG/ACT nasal spray, Place 2 sprays into both nostrils 2 (two) times daily. , Disp: , Rfl: 3 .  folic acid (V-R FOLIC ACID) 062 MCG tablet, Take 1 tablet (400 mcg total) by mouth daily., Disp: 90 tablet, Rfl: 3 .  furosemide (LASIX) 20 MG tablet, Take 2 tablets (40 mg total) by mouth daily., Disp: , Rfl:  .  hydrALAZINE (APRESOLINE) 25 MG tablet, Take 1 tablet (25 mg total) by mouth every 8 (eight) hours. (Patient taking differently: Take 25 mg by mouth 3 (three) times daily. ), Disp: 60 tablet, Rfl: 0 .  insulin glargine (LANTUS) 100 UNIT/ML injection, Inject 0.35 mLs (35 Units total) into the skin at bedtime. This is a decrease from your previous 45 units nightly., Disp: 10 mL, Rfl: 11 .  ipratropium-albuterol (DUONEB) 0.5-2.5 (3) MG/3ML SOLN, Take 3 mLs by nebulization every 4 (four) hours as needed. (Patient not taking: Reported on 03/03/2020), Disp: 360 mL, Rfl: 0 .  isosorbide mononitrate (IMDUR) 60 MG 24 hr tablet, Take 60 mg by mouth daily. , Disp: , Rfl:  .  Magnesium 250 MG TABS, Take 250 mg by mouth daily., Disp: , Rfl:  .  methocarbamol (ROBAXIN) 500 MG tablet, Take 500 mg by mouth 2 (two) times daily. , Disp: , Rfl: 0 .  metoprolol tartrate (LOPRESSOR) 50 MG tablet, Take 25 mg by mouth 3 (three) times daily. , Disp: , Rfl:  .  montelukast (SINGULAIR) 10 MG tablet, TAKE 1 TABLET BY MOUTH EVERYDAY AT BEDTIME, Disp: 90 tablet, Rfl: 1 .  nitroGLYCERIN (NITROSTAT) 0.4 MG SL tablet, Place 0.4 mg under the  tongue every 5 (five) minutes x 3 doses as needed for chest pain. , Disp: , Rfl:  .  Omega-3 Fatty Acids (FISH OIL) 1000 MG CAPS, Take 1 capsule by mouth daily. , Disp: , Rfl:  .  pantoprazole (PROTONIX) 40 MG tablet, Take 1 tablet (40 mg total) by mouth 2 (two) times daily before a meal., Disp: , Rfl:  .  rosuvastatin (CRESTOR) 40 MG tablet, Take 40 mg by mouth every evening. , Disp: , Rfl:  .  sacubitril-valsartan (ENTRESTO) 24-26 MG, Take 1 tablet by mouth 2 (two) times daily., Disp: 60 tablet, Rfl: 0 .  sodium bicarbonate 650 MG tablet, Take 650 mg by mouth 2 (two) times daily., Disp: , Rfl:  .  vitamin B-12 (CYANOCOBALAMIN) 1000 MCG tablet, Take 1 tablet (1,000 mcg total) by mouth daily., Disp: 90 tablet, Rfl: 3 .  vitamin C (ASCORBIC ACID) 250 MG tablet, Take 250 mg by mouth daily., Disp: , Rfl:  .  Vitamin D, Ergocalciferol, (DRISDOL) 50000 units CAPS capsule, Take 50,000 Units by mouth every 30 (thirty) days. , Disp: , Rfl: 1 .  zinc gluconate 50 MG tablet, Take 50 mg by mouth daily., Disp: , Rfl:    Allergies  Allergen Reactions  . Gabapentin     Other reaction(s): Other (See Comments) Tremors  . Peanut-Containing Drug Products Anaphylaxis  . Penicillins Hives and Rash    Has patient had a PCN reaction causing immediate rash, facial/tongue/throat swelling, SOB or lightheadedness with hypotension: Yes Has patient had a PCN reaction causing severe rash involving mucus membranes or skin necrosis: No Has patient had a PCN reaction that required hospitalization No Has patient had a PCN reaction occurring within the last 10 years: No If all of the above answers are "NO", then may proceed with Cephalosporin  use.  . Bee Venom Swelling  . Influenza Vaccines Hives  . Inh [Isoniazid] Hives  . Kenalog [Triamcinolone Acetonide] Hives  . Levaquin [Levofloxacin] Other (See Comments)    Tendon, ligament pain.   . Nalfon [Fenoprofen Calcium] Hives  . Naproxen   . Peanut Oil   . Nsaids Rash     Nalfon 600 Nalfon 600    ROS Review of Systems  Constitutional: Negative.   HENT: Positive for congestion, ear pain (Right ear) and postnasal drip. Negative for ear discharge, sinus pain, sore throat and trouble swallowing.   Eyes: Negative.   Respiratory: Positive for shortness of breath (Controlled with medicine). Negative for chest tightness.   Cardiovascular: Negative.  Negative for chest pain.  Gastrointestinal: Negative.   Endocrine: Negative.   Genitourinary: Negative.   Musculoskeletal: Negative.   Skin: Negative.   Allergic/Immunologic: Positive for environmental allergies.  Neurological: Negative.   Hematological: Negative.   Psychiatric/Behavioral: Negative.   All other systems reviewed and are negative.     OBJECTIVE:    Physical Exam Vitals reviewed.  Constitutional:      Appearance: Normal appearance.  HENT:     Right Ear: Hearing normal. Tenderness present. No drainage or swelling. Tympanic membrane is retracted. Tympanic membrane is not perforated.     Left Ear: No drainage, swelling or tenderness. Tympanic membrane is retracted. Tympanic membrane is not perforated.     Mouth/Throat:     Mouth: Mucous membranes are moist.     Pharynx: Oropharynx is clear. No oropharyngeal exudate or posterior oropharyngeal erythema.     Tonsils: No tonsillar exudate.  Neck:     Vascular: No carotid bruit.  Cardiovascular:     Rate and Rhythm: Normal rate and regular rhythm.     Pulses: Normal pulses.     Heart sounds: Normal heart sounds.  Pulmonary:     Effort: Pulmonary effort is normal.     Breath sounds: Normal breath sounds.  Abdominal:     General: Bowel sounds are normal.     Palpations: Abdomen is soft. There is no hepatomegaly or splenomegaly.     Tenderness: There is no abdominal tenderness.     Hernia: No hernia is present.  Musculoskeletal:     Right lower leg: No edema.     Left lower leg: No edema.  Skin:    Findings: No rash.  Neurological:      Mental Status: He is alert and oriented to person, place, and time.  Psychiatric:        Mood and Affect: Mood normal.        Behavior: Behavior normal.     BP (!) 161/58   Pulse (!) 58   Ht 5\' 6"  (1.676 m)   Wt 203 lb 3.2 oz (92.2 kg)   BMI 32.80 kg/m  Wt Readings from Last 3 Encounters:  05/27/20 203 lb 3.2 oz (92.2 kg)  05/24/20 203 lb 6.4 oz (92.3 kg)  04/18/20 197 lb (89.4 kg)    Health Maintenance Due  Topic Date Due  . FOOT EXAM  Never done  . OPHTHALMOLOGY EXAM  Never done  . COVID-19 Vaccine (1) Never done  . TETANUS/TDAP  12/10/2013    There are no preventive care reminders to display for this patient.  CBC Latest Ref Rng & Units 05/24/2020 04/18/2020 12/30/2019  WBC 4.0 - 10.5 K/uL 9.5 10.6(H) 9.1  Hemoglobin 13.0 - 17.0 g/dL 12.4(L) 12.6(L) 11.1(L)  Hematocrit 39 - 52 % 36.6(L) 38.1(L) 35.3(L)  Platelets 150 - 400 K/uL 229 239 266   CMP Latest Ref Rng & Units 04/18/2020 01/04/2020 12/30/2019  Glucose 70 - 99 mg/dL 194(H) 85 88  BUN 8 - 23 mg/dL 52(H) 44(H) 32(H)  Creatinine 0.61 - 1.24 mg/dL 1.91(H) 1.93(H) 1.48(H)  Sodium 135 - 145 mmol/L 137 138 139  Potassium 3.5 - 5.1 mmol/L 4.2 5.2(H) 4.2  Chloride 98 - 111 mmol/L 108 108 109  CO2 22 - 32 mmol/L 19(L) 21(L) 22  Calcium 8.9 - 10.3 mg/dL 8.9 9.2 8.6(L)  Total Protein 6.5 - 8.1 g/dL 7.4 - -  Total Bilirubin 0.3 - 1.2 mg/dL 0.5 - -  Alkaline Phos 38 - 126 U/L 65 - -  AST 15 - 41 U/L 19 - -  ALT 0 - 44 U/L 12 - -    Lab Results  Component Value Date   TSH 1.948 12/06/2018   Lab Results  Component Value Date   ALBUMIN 4.3 04/18/2020   ANIONGAP 10 04/18/2020   Lab Results  Component Value Date   CHOL 115 12/29/2019   CHOL 107 12/15/2019   CHOL 112 11/15/2018   HDL 31 (L) 12/29/2019   HDL 22 (L) 12/15/2019   HDL 27 (L) 11/15/2018   LDLCALC 51 12/29/2019   LDLCALC 33 12/15/2019   LDLCALC 51 11/15/2018   CHOLHDL 3.7 12/29/2019   CHOLHDL 4.9 12/15/2019   CHOLHDL 4.1 11/15/2018   Lab Results    Component Value Date   TRIG 163 (H) 12/29/2019   Lab Results  Component Value Date   HGBA1C 8.7 (H) 12/15/2019   HGBA1C 8.5 (H) 12/14/2019   HGBA1C 7.2 (H) 11/15/2018      ASSESSMENT & PLAN:   Problem List Items Addressed This Visit      Cardiovascular and Mediastinum   Chronic diastolic heart failure (HCC) (Chronic)    Congestive heart failure is under control on lower dose of Entresto.  Dose of Entresto cannot be increased because of CKD.        Respiratory   COPD (chronic obstructive pulmonary disease) ([place])    Patient was not found to have any wheezing on today's examination.        Endocrine   Type II diabetes mellitus with renal manifestations ([place])    Blood sugar is under control.  Diabetic medication reviewed with the patient.        Genitourinary   CKD (chronic kidney disease) stage 4, GFR 15-29 ml/min (HCC)    Patient is being followed up by nephrologist        Other   Otalgia of both ears - Primary    Patient was found to have retracted drawn on the both side there is no evidence of any wax in the ears.  There is no evidence of otitis externa both drums were found to be retracted.         No orders of the defined types were placed in this encounter.  Was instructed on the salt water rinse  1. Otalgia of both ears    2. Type 2 diabetes mellitus with other diabetic kidney complication, without long-term current use of insulin ([place])    3. CKD (chronic kidney disease) stage 4, GFR 15-29 ml/min (HCC)    4. Chronic obstructive pulmonary disease, unspecified COPD type ([place])    5. Chronic diastolic heart failure ([place])  Follow-up: Return in about 3 months (around 08/27/2020).    Dr. Jane Canary [facility] [address], [place],  [place] [address]   By signing my name below, I, [employer], attest that this documentation has been prepared under the direction and in the presence of Cletis Athens, MD. Electronically Signed:  Cletis Athens, MD 05/27/20, 5:23 PM  I personally performed the services described in this documentation, which was SCRIBED in my presence. The recorded information has been reviewed and considered accurate. It has been edited as necessary during review. Cletis Athens, MD

## 2020-05-27 NOTE — Assessment & Plan Note (Signed)
Congestive heart failure is under control on lower dose of Entresto.  Dose of Entresto cannot be increased because of CKD.

## 2020-05-27 NOTE — Assessment & Plan Note (Signed)
Blood sugar is under control.  Diabetic medication reviewed with the patient.

## 2020-05-27 NOTE — Patient Instructions (Signed)
Dr. Lavera Guise recommended you to gargle with a salt water rinse that you can mix with 1 cup (8 oz) of water, 1 teaspoon of salt, and 1 teaspoon of baking soda.

## 2020-05-27 NOTE — Assessment & Plan Note (Signed)
Patient is being followed up by nephrologist

## 2020-06-21 ENCOUNTER — Ambulatory Visit (INDEPENDENT_AMBULATORY_CARE_PROVIDER_SITE_OTHER): Payer: Medicare Other

## 2020-06-21 ENCOUNTER — Encounter (INDEPENDENT_AMBULATORY_CARE_PROVIDER_SITE_OTHER): Payer: Self-pay | Admitting: Vascular Surgery

## 2020-06-21 ENCOUNTER — Other Ambulatory Visit: Payer: Self-pay

## 2020-06-21 ENCOUNTER — Ambulatory Visit (INDEPENDENT_AMBULATORY_CARE_PROVIDER_SITE_OTHER): Payer: Medicare Other | Admitting: Vascular Surgery

## 2020-06-21 VITALS — BP 154/62 | HR 56 | Ht 66.0 in | Wt 205.0 lb

## 2020-06-21 DIAGNOSIS — I6523 Occlusion and stenosis of bilateral carotid arteries: Secondary | ICD-10-CM

## 2020-06-21 DIAGNOSIS — I739 Peripheral vascular disease, unspecified: Secondary | ICD-10-CM

## 2020-06-21 DIAGNOSIS — Z95820 Peripheral vascular angioplasty status with implants and grafts: Secondary | ICD-10-CM | POA: Diagnosis not present

## 2020-06-21 DIAGNOSIS — I10 Essential (primary) hypertension: Secondary | ICD-10-CM

## 2020-06-21 DIAGNOSIS — I5030 Unspecified diastolic (congestive) heart failure: Secondary | ICD-10-CM | POA: Insufficient documentation

## 2020-06-21 DIAGNOSIS — N184 Chronic kidney disease, stage 4 (severe): Secondary | ICD-10-CM

## 2020-06-21 DIAGNOSIS — E785 Hyperlipidemia, unspecified: Secondary | ICD-10-CM | POA: Diagnosis not present

## 2020-06-21 DIAGNOSIS — E1122 Type 2 diabetes mellitus with diabetic chronic kidney disease: Secondary | ICD-10-CM

## 2020-06-21 DIAGNOSIS — I700 Atherosclerosis of aorta: Secondary | ICD-10-CM | POA: Insufficient documentation

## 2020-06-21 DIAGNOSIS — I509 Heart failure, unspecified: Secondary | ICD-10-CM | POA: Insufficient documentation

## 2020-06-21 DIAGNOSIS — D649 Anemia, unspecified: Secondary | ICD-10-CM | POA: Diagnosis not present

## 2020-06-21 NOTE — Assessment & Plan Note (Signed)
Carotid duplex does show progression of his velocities in the right carotid artery now in the 60 to 79% range.  The left carotid artery remains in the 1 to 39% range. Although this is still below the threshold for consideration for repair, I will want to shorten his follow-up interval now to 6 months.  He should continue aspirin, Plavix, and a statin agent.

## 2020-06-21 NOTE — Assessment & Plan Note (Signed)
Avoid contrast unless absolutely necessary. 

## 2020-06-21 NOTE — Progress Notes (Signed)
MRN : [id number]  Mike Decker is a 79 y.o. (1941/01/17) male who presents with chief complaint of  Chief Complaint  Patient presents with  . Follow-up    U/S follow up  .  History of Present Illness: Patient returns today in follow-up of multiple vascular issues.  He is doing reasonably well.  He continues to have what sounds like some neuropathic pain in the right leg.  This is not overt claudication and not consistent with rest pain.  It is intermittent in nature and mostly in the right thigh.  It is stabbing and brief but when it happens it is severe.  No wounds or ulceration.  He has previous history of intervention to the right lower extremity about 6 years ago. ABIs today remain in the normal range at 1.01 on the right and 0.97 on the left with strong waveforms.  This is unchanged from his study a year ago. He is also followed for carotid disease.  He has no focal neurologic symptoms since his last visit. Specifically, the patient denies amaurosis fugax, speech or swallowing difficulties, or arm or leg weakness or numbness.  Carotid duplex does show progression of his velocities in the right carotid artery now in the 60 to 79% range.  The left carotid artery remains in the 1 to 39% range.  Current Outpatient Medications  Medication Sig Dispense Refill  . acetaminophen (TYLENOL) 500 MG tablet Take 1,000 mg by mouth every 6 (six) hours as needed for mild pain, moderate pain or headache.    . allopurinol (ZYLOPRIM) 100 MG tablet Take 100 mg by mouth daily.     Marland Kitchen amLODipine (NORVASC) 5 MG tablet Take 5 mg by mouth 2 (two) times daily.     Marland Kitchen aspirin EC 81 MG EC tablet Take 1 tablet (81 mg total) by mouth daily. 60 tablet 0  . azelastine (ASTELIN) 0.1 % nasal spray USE 1 TO 2 SPRAYS IN EACH NOSTRIL TWICE A DAY    . budesonide-formoterol (SYMBICORT) 160-4.5 MCG/ACT inhaler Inhale 2 puffs into the lungs 2 (two) times daily as needed.     . cetirizine (ZYRTEC) 5 MG tablet Take 5 mg by  mouth daily as needed for allergies or rhinitis.     . citalopram (CELEXA) 10 MG tablet Take 10 mg by mouth daily.    . clopidogrel (PLAVIX) 75 MG tablet Take 1 tablet (75 mg total) by mouth daily. 30 tablet 11  . colchicine 0.6 MG tablet Take 3 tablets (1.8 mg total) by mouth daily as needed (gout flares).    Marland Kitchen dextromethorphan-guaiFENesin (MUCINEX DM) 30-600 MG 12hr tablet Take 1 tablet by mouth 2 (two) times daily as needed for cough.    . EPINEPHrine (EPI-PEN) 0.3 mg/0.3 mL SOAJ injection Inject into the muscle as needed (anaphylaxis).     . ferrous sulfate 325 (65 FE) MG EC tablet Take 1 tablet (325 mg total) by mouth daily. 90 tablet 3  . fluticasone (FLONASE) 50 MCG/ACT nasal spray Place 2 sprays into both nostrils 2 (two) times daily.   3  . folic acid (V-R FOLIC ACID) 287 MCG tablet Take 1 tablet (400 mcg total) by mouth daily. 90 tablet 3  . furosemide (LASIX) 20 MG tablet Take 2 tablets (40 mg total) by mouth daily.    . hydrALAZINE (APRESOLINE) 25 MG tablet Take 1 tablet (25 mg total) by mouth every 8 (eight) hours. (Patient taking differently: Take 25 mg by mouth 3 (three) times daily. )  60 tablet 0  . insulin aspart (NOVOLOG) 100 UNIT/ML FlexPen Take up to 100 units daily in divided doses, as advised.    . insulin glargine (LANTUS) 100 UNIT/ML injection Inject 0.35 mLs (35 Units total) into the skin at bedtime. This is a decrease from your previous 45 units nightly. 10 mL 11  . ipratropium-albuterol (DUONEB) 0.5-2.5 (3) MG/3ML SOLN Take 3 mLs by nebulization every 4 (four) hours as needed. 360 mL 0  . isosorbide mononitrate (IMDUR) 60 MG 24 hr tablet Take 60 mg by mouth daily.     . Magnesium 250 MG TABS Take 250 mg by mouth daily.    . methocarbamol (ROBAXIN) 500 MG tablet Take 500 mg by mouth 2 (two) times daily.   0  . metoprolol tartrate (LOPRESSOR) 50 MG tablet Take 25 mg by mouth 3 (three) times daily.     . montelukast (SINGULAIR) 10 MG tablet TAKE 1 TABLET BY MOUTH EVERYDAY AT  BEDTIME 90 tablet 1  . nitroGLYCERIN (NITROSTAT) 0.4 MG SL tablet Place 0.4 mg under the tongue every 5 (five) minutes x 3 doses as needed for chest pain.     . Omega-3 Fatty Acids (FISH OIL) 1000 MG CAPS Take 1 capsule by mouth daily.     . pantoprazole (PROTONIX) 40 MG tablet Take 1 tablet (40 mg total) by mouth 2 (two) times daily before a meal.    . rosuvastatin (CRESTOR) 40 MG tablet Take 40 mg by mouth every evening.     . sacubitril-valsartan (ENTRESTO) 24-26 MG Take 1 tablet by mouth 2 (two) times daily. 60 tablet 0  . sodium bicarbonate 650 MG tablet Take 650 mg by mouth 2 (two) times daily.    . vitamin B-12 (CYANOCOBALAMIN) 1000 MCG tablet Take 1 tablet (1,000 mcg total) by mouth daily. 90 tablet 3  . vitamin C (ASCORBIC ACID) 250 MG tablet Take 250 mg by mouth daily.    . Vitamin D, Ergocalciferol, (DRISDOL) 50000 units CAPS capsule Take 50,000 Units by mouth every 30 (thirty) days.   1  . zinc gluconate 50 MG tablet Take 50 mg by mouth daily.     No current facility-administered medications for this visit.    Past Medical History:  Diagnosis Date  . Anemia   . Arthritis   . Atrioventricular canal (AVC)    irregular heart beats  . Barrett esophagus   . 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 Chronic diastolic CHF (congestive heart failure) ([place])   . Colon polyp   . Diabetes mellitus without complication ([place])   . Diverticulosis   . Gout   . Heart disease   . Hemangioma    liver  . Hyperlipidemia   . Hypertension   . Myocardial infarct ([place])   . Ocular hypertension   . Peripheral vascular disease ([place])   . Skin cancer   . 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 Sleep apnea   . Vitreoretinal degeneration     Past Surgical History:  Procedure Laterality Date  . CATARACT EXTRACTION  2011, 2012  . COLONOSCOPY N/A 12/14/2018   Procedure: COLONOSCOPY;  Surgeon: Virgel Manifold, MD;  Location: [facility] ENDOSCOPY;  Service: Endoscopy;  Laterality: N/A;  . CORONARY ANGIOPLASTY  WITH STENT PLACEMENT  2012  . CORONARY STENT INTERVENTION N/A 12/29/2019   Procedure: CORONARY STENT INTERVENTION;  Surgeon: Nelva Bush, MD;  Location: [place] [facility];  Service: Cardiovascular;  Laterality: N/A;  . ESOPHAGOGASTRODUODENOSCOPY N/A 12/14/2018   Procedure: ESOPHAGOGASTRODUODENOSCOPY (  EGD);  Surgeon: Virgel Manifold, MD;  Location: [facility] ENDOSCOPY;  Service: Endoscopy;  Laterality: N/A;  . ESOPHAGOGASTRODUODENOSCOPY (EGD) WITH PROPOFOL N/A 06/01/2016   Procedure: ESOPHAGOGASTRODUODENOSCOPY (EGD) WITH PROPOFOL;  Surgeon: Manya Silvas, MD;  Location: [facility] ENDOSCOPY;  Service: Endoscopy;  Laterality: N/A;  . HERNIA REPAIR     x2  . INTRAOCULAR LENS INSERTION    . LEFT HEART CATH AND CORONARY ANGIOGRAPHY N/A 05/09/2017   Procedure: Left Heart Cath and Coronary Angiography;  Surgeon: Yolonda Kida, MD;  Location: [place] [facility];  Service: Cardiovascular;  Laterality: N/A;  . LEFT HEART CATH AND CORONARY ANGIOGRAPHY N/A 12/08/2018   Procedure: LEFT HEART CATH AND CORONARY ANGIOGRAPHY and possible PCI and stent;  Surgeon: Yolonda Kida, MD;  Location: [place] [facility];  Service: Cardiovascular;  Laterality: N/A;  . LEFT HEART CATH AND CORONARY ANGIOGRAPHY N/A 12/29/2019   Procedure: LEFT HEART CATH AND CORONARY ANGIOGRAPHY;  Surgeon: Nelva Bush, MD;  Location: [place] [facility];  Service: Cardiovascular;  Laterality: N/A;  . NOSE SURGERY     submucous resection  . PROSTATE SURGERY  2002  . ROTATOR CUFF REPAIR Right      Social History   Tobacco Use  . Smoking status: Former Smoker    Years: 20.00    Types: Cigarettes    Quit date: 12/10/1976    Years since quitting: 43.5  . Smokeless tobacco: Never Used  Substance Use Topics  . Alcohol use: No  . Drug use: No     Family History  Problem Relation Age of Onset  . Arthritis Mother   . Stroke Maternal Grandfather   . Breast cancer Neg Hx   no bleeding or clotting  disorders  Allergies  Allergen Reactions  . Gabapentin     Other reaction(s): Other (See Comments) Tremors  . Peanut-Containing Drug Products Anaphylaxis  . Penicillins Hives and Rash    Has patient had a PCN reaction causing immediate rash, facial/tongue/throat swelling, SOB or lightheadedness with hypotension: Yes Has patient had a PCN reaction causing severe rash involving mucus membranes or skin necrosis: No Has patient had a PCN reaction that required hospitalization No Has patient had a PCN reaction occurring within the last 10 years: No If all of the above answers are "NO", then may proceed with Cephalosporin use.  . Bee Venom Swelling  . Influenza Vaccines Hives  . Inh [Isoniazid] Hives  . Kenalog [Triamcinolone Acetonide] Hives  . Levaquin [Levofloxacin] Other (See Comments)    Tendon, ligament pain.   . Nalfon [Fenoprofen Calcium] Hives  . Naproxen   . Peanut Oil   . Nsaids Rash    Nalfon 600 Nalfon 600     REVIEW OF SYSTEMS(Negative unless checked)  Constitutional: [] ?Weight loss[] ?Fever[] ?Chills Cardiac:[] ?Chest pain[] ?Chest pressure[] ?Palpitations [] ?Shortness of breath when laying flat [] ?Shortness of breath at rest [] ?Shortness of breath with exertion. Vascular: [x] ?Pain in legs with walking[x] ?Pain in legsat rest[] ?Pain in legs when laying flat [x] ?Claudication [] ?Pain in feet when walking [] ?Pain in feet at rest [] ?Pain in feet when laying flat [] ?History of DVT [] ?Phlebitis [] ?Swelling in legs [] ?Varicose veins [] ?Non-healing ulcers Pulmonary: [] ?Uses home oxygen [] ?Productive cough[] ?Hemoptysis [] ?Wheeze [] ?COPD [] ?Asthma Neurologic: [] ?Dizziness [] ?Blackouts [] ?Seizures [] ?History of stroke [x] ?History of TIA[] ?Aphasia [] ?Temporary blindness[] ?Dysphagia [] ?Weaknessor numbness in arms [x] ?Weakness or numbnessin legs Musculoskeletal: [x] ?Arthritis [] ?Joint swelling [] ?Joint pain  [] ?Low back pain Hematologic:[] ?Easy bruising[] ?Easy bleeding [] ?Hypercoagulable state [] ?Anemic [] ?Hepatitis Gastrointestinal:[] ?Blood in stool[] ?Vomiting blood[x] ?Gastroesophageal reflux/heartburn[x] ?Difficulty swallowing. Genitourinary: [] ?Chronic kidney disease [] ?Difficulturination [] ?Frequenturination [] ?Burning with urination[] ?Blood in urine Skin: [] ?  Rashes [] ?Ulcers [] ?Wounds Psychological: [] ?History of anxiety[] ?History of major depression.   Physical Examination  Vitals:   06/21/20 1526  BP: (!) 154/62  Pulse: (!) 56  Weight: 205 lb (93 kg)  Height: 5\' 6"  (1.676 m)   Body mass index is 33.09 kg/m. Gen:  WD/WN, NAD Head: /AT, No temporalis wasting. Ear/Nose/Throat: Hearing grossly intact, nares w/o erythema or drainage, trachea midline Eyes: Conjunctiva clear. Sclera non-icteric Neck: Supple.  Right carotid bruit  Pulmonary:  Good air movement, equal and clear to auscultation bilaterally.  Cardiac: RRR, No JVD Vascular:  Vessel Right Left  Radial Palpable Palpable                          PT Palpable Palpable  DP Palpable Palpable    Musculoskeletal: M/S 5/5 throughout.  No deformity or atrophy. No significant LE edema. Neurologic: CN 2-12 intact. Sensation grossly intact in extremities.  Symmetrical.  Speech is fluent. Motor exam as listed above. Psychiatric: Judgment intact, Mood & affect appropriate for pt's clinical situation. Dermatologic: No rashes or ulcers noted.  No cellulitis or open wounds.      CBC Lab Results  Component Value Date   WBC 9.5 05/24/2020   HGB 12.4 (L) 05/24/2020   HCT 36.6 (L) 05/24/2020   MCV 89.5 05/24/2020   PLT 229 05/24/2020    BMET    Component Value Date/Time   NA 137 04/18/2020 0303   NA 139 11/03/2014 0711   K 4.2 04/18/2020 0303   K 4.6 11/03/2014 0711   CL 108 04/18/2020 0303   CL 104 11/03/2014 0711   CO2 19 (L) 04/18/2020 0303   CO2 28 11/03/2014 0711    GLUCOSE 194 (H) 04/18/2020 0303   GLUCOSE 234 (H) 11/03/2014 0711   BUN 52 (H) 04/18/2020 0303   BUN 43 (H) 11/03/2014 0711   CREATININE 1.91 (H) 04/18/2020 0303   CREATININE 1.68 (H) 11/03/2014 0711   CALCIUM 8.9 04/18/2020 0303   CALCIUM 8.9 11/03/2014 0711   GFRNONAA 33 (L) 04/18/2020 0303   GFRNONAA 43 (L) 11/03/2014 0711   GFRAA 38 (L) 04/18/2020 0303   GFRAA 52 (L) 11/03/2014 0711   CrCl cannot be calculated (Patient's most recent lab result is older than the maximum 21 days allowed.).  COAG Lab Results  Component Value Date   INR 1.0 12/29/2019   INR 0.9 12/14/2019   INR 0.98 12/05/2018    Radiology No results found.   Assessment/Plan Diabetes (HCC) blood glucose control important in reducing the progression of atherosclerotic disease. Also, involved in wound healing. On appropriate medications.   Essential hypertension blood pressure control important in reducing the progression of atherosclerotic disease. On appropriate oral medications.   Hyperlipidemia lipid control important in reducing the progression of atherosclerotic disease. Continue statin therapy  Symptomatic anemia Follows with hematology.  Recently restarted on B12.  CKD (chronic kidney disease) stage 4, GFR 15-29 ml/min (HCC) Avoid contrast unless absolutely necessary  PAD (peripheral artery disease) (HCC) ABIs today remain in the normal range at 1.01 on the right and 0.97 on the left with strong waveforms.  This is unchanged from his study a year ago.  No role for intervention.  Continue aspirin, Plavix, and statin agent.  Recheck in 1 year.  Carotid stenosis Carotid duplex does show progression of his velocities in the right carotid artery now in the 60 to 79% range.  The left carotid artery remains in the 1 to 39% range.  Although this is still below the threshold for consideration for repair, I will want to shorten his follow-up interval now to 6 months.  He should continue aspirin,  Plavix, and a statin agent.    Leotis Pain, MD  06/21/2020 5:14 PM    This note was created with Dragon medical transcription system.  Any errors from dictation are purely unintentional

## 2020-06-21 NOTE — Assessment & Plan Note (Signed)
ABIs today remain in the normal range at 1.01 on the right and 0.97 on the left with strong waveforms.  This is unchanged from his study a year ago.  No role for intervention.  Continue aspirin, Plavix, and statin agent.  Recheck in 1 year.

## 2020-06-21 NOTE — Patient Instructions (Signed)
Peripheral Vascular Disease  Peripheral vascular disease (PVD) is a disease of the blood vessels that are not part of your heart and brain. A simple term for PVD is poor circulation. In most cases, PVD narrows the blood vessels that carry blood from your heart to the rest of your body. This can reduce the supply of blood to your arms, legs, and internal organs, like your stomach or kidneys. However, PVD most often affects a person's lower legs and feet. Without treatment, PVD tends to get worse. PVD can also lead to acute ischemic limb. This is when an arm or leg suddenly cannot get enough blood. This is a medical emergency. Follow these instructions at home: Lifestyle  Do not use any products that contain nicotine or tobacco, such as cigarettes and e-cigarettes. If you need help quitting, ask your doctor.  Lose weight if you are overweight. Or, stay at a healthy weight as told by your doctor.  Eat a diet that is low in fat and cholesterol. If you need help, ask your doctor.  Exercise regularly. Ask your doctor for activities that are right for you. General instructions  Take over-the-counter and prescription medicines only as told by your doctor.  Take good care of your feet: ? Wear comfortable shoes that fit well. ? Check your feet often for any cuts or sores.  Keep all follow-up visits as told by your doctor This is important. Contact a doctor if:  You have cramps in your legs when you walk.  You have leg pain when you are at rest.  You have coldness in a leg or foot.  Your skin changes.  You are unable to get or have an erection (erectile dysfunction).  You have cuts or sores on your feet that do not heal. Get help right away if:  Your arm or leg turns cold, numb, and blue.  Your arms or legs become red, warm, swollen, painful, or numb.  You have chest pain.  You have trouble breathing.  You suddenly have weakness in your face, arm, or leg.  You become very  confused or you cannot speak.  You suddenly have a very bad headache.  You suddenly cannot see. Summary  Peripheral vascular disease (PVD) is a disease of the blood vessels.  A simple term for PVD is poor circulation. Without treatment, PVD tends to get worse.  Treatment may include exercise, low fat and low cholesterol diet, and quitting smoking. This information is not intended to replace advice given to you by your health care provider. Make sure you discuss any questions you have with your health care provider. Document Revised: 11/08/2017 Document Reviewed: 01/03/2017 Elsevier Patient Education  2020 [employer]  

## 2020-06-21 NOTE — Assessment & Plan Note (Signed)
Follows with hematology.  Recently restarted on B12.

## 2020-06-28 ENCOUNTER — Ambulatory Visit: Payer: Medicare Other | Admitting: Internal Medicine

## 2020-06-30 ENCOUNTER — Ambulatory Visit (INDEPENDENT_AMBULATORY_CARE_PROVIDER_SITE_OTHER): Payer: Medicare Other | Admitting: Internal Medicine

## 2020-06-30 ENCOUNTER — Other Ambulatory Visit: Payer: Self-pay

## 2020-06-30 ENCOUNTER — Encounter: Payer: Self-pay | Admitting: Internal Medicine

## 2020-06-30 VITALS — BP 164/84 | HR 64 | Ht 65.0 in | Wt 212.5 lb

## 2020-06-30 DIAGNOSIS — J449 Chronic obstructive pulmonary disease, unspecified: Secondary | ICD-10-CM | POA: Diagnosis not present

## 2020-06-30 DIAGNOSIS — E1129 Type 2 diabetes mellitus with other diabetic kidney complication: Secondary | ICD-10-CM

## 2020-06-30 DIAGNOSIS — D631 Anemia in chronic kidney disease: Secondary | ICD-10-CM | POA: Diagnosis not present

## 2020-06-30 DIAGNOSIS — R0602 Shortness of breath: Secondary | ICD-10-CM | POA: Diagnosis not present

## 2020-06-30 DIAGNOSIS — N184 Chronic kidney disease, stage 4 (severe): Secondary | ICD-10-CM | POA: Diagnosis not present

## 2020-06-30 DIAGNOSIS — G4733 Obstructive sleep apnea (adult) (pediatric): Secondary | ICD-10-CM

## 2020-06-30 MED ORDER — DAPAGLIFLOZIN PROPANEDIOL 10 MG PO TABS: 10.0000 mg | ORAL_TABLET | Freq: Every day | ORAL | 5 refills | Status: DC

## 2020-06-30 NOTE — Assessment & Plan Note (Signed)
Blood sugar is 106 today but his hemoglobin AIC is 8.8 about a month and a half ago.  I started him on Farxiga 10 mg p.o. daily.

## 2020-06-30 NOTE — Assessment & Plan Note (Signed)
He has gained 6 pounds so we will try to get rid of it by adding 20 mg of Lasix extra every day

## 2020-06-30 NOTE — Assessment & Plan Note (Signed)
Ref to nephrologist 

## 2020-06-30 NOTE — Assessment & Plan Note (Signed)
B12  and iron supplementation See heamatologist on  a regular basis.

## 2020-06-30 NOTE — Progress Notes (Signed)
Established Patient Office Visit  Subjective:  Patient ID: Mike Decker, male    DOB: February 01, 1941  Age: 79 y.o. MRN: [id number]  CC:  Chief Complaint  Patient presents with  . Wheezing    having some SOB   . Congestive Heart Failure    patient on 40 mg lasix, but states he has been gain weight     Wheezing  The current episode started today. The problem occurs intermittently. Associated symptoms include coughing and shortness of breath. Pertinent negatives include no chest pain, chills, coryza, fever, hemoptysis, sore throat or sputum production. The symptoms are aggravated by exercise and pollens. He has tried leukotriene antagonists for the symptoms. The treatment provided mild relief. His past medical history is significant for bronchiolitis, CAD and chronic lung disease.  Congestive Heart Failure Associated symptoms include shortness of breath. Pertinent negatives include no chest pain. His past medical history is significant for CAD and chronic lung disease.    [name] presents for sob  Past Medical History:  Diagnosis Date  . Anemia   . Arthritis   . Atrioventricular canal (AVC)    irregular heart beats  . Barrett esophagus   . 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 Chronic diastolic CHF (congestive heart failure) ([place])   . Colon polyp   . Diabetes mellitus without complication ([place])   . Diverticulosis   . Gout   . Heart disease   . Hemangioma    liver  . Hyperlipidemia   . Hypertension   . Myocardial infarct ([place])   . Ocular hypertension   . Peripheral vascular disease ([place])   . Skin cancer   . 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[place])   . Sleep apnea   . Vitreoretinal degeneration     Past Surgical History:  Procedure Laterality Date  . CATARACT EXTRACTION  2011, 2012  . COLONOSCOPY N/A 12/14/2018   Procedure: COLONOSCOPY;  Surgeon: Virgel Manifold, MD;  Location: [facility] ENDOSCOPY;  Service: Endoscopy;  Laterality: N/A;  . CORONARY ANGIOPLASTY WITH  STENT PLACEMENT  2012  . CORONARY STENT INTERVENTION N/A 12/29/2019   Procedure: CORONARY STENT INTERVENTION;  Surgeon: Nelva Bush, MD;  Location: [facility];  Service: Cardiovascular;  Laterality: N/A;  . ESOPHAGOGASTRODUODENOSCOPY N/A 12/14/2018   Procedure: ESOPHAGOGASTRODUODENOSCOPY (EGD);  Surgeon: Virgel Manifold, MD;  Location: [facility] ENDOSCOPY;  Service: Endoscopy;  Laterality: N/A;  . ESOPHAGOGASTRODUODENOSCOPY (EGD) WITH PROPOFOL N/A 06/01/2016   Procedure: ESOPHAGOGASTRODUODENOSCOPY (EGD) WITH PROPOFOL;  Surgeon: Manya Silvas, MD;  Location: [facility] ENDOSCOPY;  Service: Endoscopy;  Laterality: N/A;  . HERNIA REPAIR     x2  . INTRAOCULAR LENS INSERTION    . LEFT HEART CATH AND CORONARY ANGIOGRAPHY N/A 05/09/2017   Procedure: Left Heart Cath and Coronary Angiography;  Surgeon: Yolonda Kida, MD;  Location: [place] [facility];  Service: Cardiovascular;  Laterality: N/A;  . LEFT HEART CATH AND CORONARY ANGIOGRAPHY N/A 12/08/2018   Procedure: LEFT HEART CATH AND CORONARY ANGIOGRAPHY and possible PCI and stent;  Surgeon: Yolonda Kida, MD;  Location: [place] [facility];  Service: Cardiovascular;  Laterality: N/A;  . LEFT HEART CATH AND CORONARY ANGIOGRAPHY N/A 12/29/2019   Procedure: LEFT HEART CATH AND CORONARY ANGIOGRAPHY;  Surgeon: Nelva Bush, MD;  Location: [place] [facility];  Service: Cardiovascular;  Laterality: N/A;  . NOSE SURGERY     submucous resection  . PROSTATE SURGERY  2002  . ROTATOR CUFF REPAIR Right     Family  History  Problem Relation Age of Onset  . Arthritis Mother   . Stroke Maternal Grandfather   . Breast cancer Neg Hx     Social History   Socioeconomic History  . Marital status: Married    Spouse name: Not on file  . Number of children: Not on file  . Years of education: Not on file  . Highest education level: Not on file  Occupational History  . Not on file  Tobacco Use  . Smoking status: Former Smoker     Years: 20.00    Types: Cigarettes    Quit date: 12/10/1976    Years since quitting: 43.5  . Smokeless tobacco: Never Used  Substance and Sexual Activity  . Alcohol use: No  . Drug use: No  . Sexual activity: Not on file  Other Topics Concern  . Not on file  Social History Narrative  . Not on file   Social Determinants of Health   Financial Resource Strain:   . Difficulty of Paying Living Expenses:   Food Insecurity:   . Worried About Charity fundraiser in the Last Year:   . Arboriculturist in the Last Year:   Transportation Needs:   . Film/video editor (Medical):   Marland Kitchen Lack of Transportation (Non-Medical):   Physical Activity:   . Days of Exercise per Week:   . Minutes of Exercise per Session:   Stress:   . Feeling of Stress :   Social Connections:   . Frequency of Communication with Friends and Family:   . Frequency of Social Gatherings with Friends and Family:   . Attends Religious Services:   . Active Member of Clubs or Organizations:   . Attends Archivist Meetings:   Marland Kitchen Marital Status:   Intimate Partner Violence:   . Fear of Current or Ex-Partner:   . Emotionally Abused:   Marland Kitchen Physically Abused:   . Sexually Abused:      Current Outpatient Medications:  .  acetaminophen (TYLENOL) 500 MG tablet, Take 1,000 mg by mouth every 6 (six) hours as needed for mild pain, moderate pain or headache., Disp: , Rfl:  .  allopurinol (ZYLOPRIM) 100 MG tablet, Take 100 mg by mouth daily. , Disp: , Rfl:  .  amLODipine (NORVASC) 5 MG tablet, Take 5 mg by mouth 2 (two) times daily. , Disp: , Rfl:  .  aspirin EC 81 MG EC tablet, Take 1 tablet (81 mg total) by mouth daily., Disp: 60 tablet, Rfl: 0 .  azelastine (ASTELIN) 0.1 % nasal spray, USE 1 TO 2 SPRAYS IN EACH NOSTRIL TWICE A DAY, Disp: , Rfl:  .  budesonide-formoterol (SYMBICORT) 160-4.5 MCG/ACT inhaler, Inhale 2 puffs into the lungs 2 (two) times daily as needed. , Disp: , Rfl:  .  cetirizine (ZYRTEC) 5 MG tablet,  Take 5 mg by mouth daily as needed for allergies or rhinitis. , Disp: , Rfl:  .  citalopram (CELEXA) 10 MG tablet, Take 10 mg by mouth daily., Disp: , Rfl:  .  clopidogrel (PLAVIX) 75 MG tablet, Take 1 tablet (75 mg total) by mouth daily., Disp: 30 tablet, Rfl: 11 .  colchicine 0.6 MG tablet, Take 3 tablets (1.8 mg total) by mouth daily as needed (gout flares)., Disp: , Rfl:  .  dextromethorphan-guaiFENesin (MUCINEX DM) 30-600 MG 12hr tablet, Take 1 tablet by mouth 2 (two) times daily as needed for cough., Disp: , Rfl:  .  EPINEPHrine (EPI-PEN) 0.3 mg/0.3 mL SOAJ  injection, Inject into the muscle as needed (anaphylaxis). , Disp: , Rfl:  .  ferrous sulfate 325 (65 FE) MG EC tablet, Take 1 tablet (325 mg total) by mouth daily., Disp: 90 tablet, Rfl: 3 .  fluticasone (FLONASE) 50 MCG/ACT nasal spray, Place 2 sprays into both nostrils 2 (two) times daily. , Disp: , Rfl: 3 .  folic acid (V-R FOLIC ACID) 323 MCG tablet, Take 1 tablet (400 mcg total) by mouth daily., Disp: 90 tablet, Rfl: 3 .  furosemide (LASIX) 20 MG tablet, Take 2 tablets (40 mg total) by mouth daily., Disp: , Rfl:  .  hydrALAZINE (APRESOLINE) 25 MG tablet, Take 1 tablet (25 mg total) by mouth every 8 (eight) hours. (Patient taking differently: Take 25 mg by mouth 3 (three) times daily. ), Disp: 60 tablet, Rfl: 0 .  insulin aspart (NOVOLOG) 100 UNIT/ML FlexPen, Take up to 100 units daily in divided doses, as advised., Disp: , Rfl:  .  insulin glargine (LANTUS) 100 UNIT/ML injection, Inject 0.35 mLs (35 Units total) into the skin at bedtime. This is a decrease from your previous 45 units nightly., Disp: 10 mL, Rfl: 11 .  ipratropium-albuterol (DUONEB) 0.5-2.5 (3) MG/3ML SOLN, Take 3 mLs by nebulization every 4 (four) hours as needed., Disp: 360 mL, Rfl: 0 .  isosorbide mononitrate (IMDUR) 60 MG 24 hr tablet, Take 60 mg by mouth daily. , Disp: , Rfl:  .  Magnesium 250 MG TABS, Take 250 mg by mouth daily., Disp: , Rfl:  .  methocarbamol  (ROBAXIN) 500 MG tablet, Take 500 mg by mouth 2 (two) times daily. , Disp: , Rfl: 0 .  metoprolol tartrate (LOPRESSOR) 50 MG tablet, Take 25 mg by mouth 3 (three) times daily. , Disp: , Rfl:  .  montelukast (SINGULAIR) 10 MG tablet, TAKE 1 TABLET BY MOUTH EVERYDAY AT BEDTIME, Disp: 90 tablet, Rfl: 1 .  nitroGLYCERIN (NITROSTAT) 0.4 MG SL tablet, Place 0.4 mg under the tongue every 5 (five) minutes x 3 doses as needed for chest pain. , Disp: , Rfl:  .  Omega-3 Fatty Acids (FISH OIL) 1000 MG CAPS, Take 1 capsule by mouth daily. , Disp: , Rfl:  .  pantoprazole (PROTONIX) 40 MG tablet, Take 1 tablet (40 mg total) by mouth 2 (two) times daily before a meal., Disp: , Rfl:  .  rosuvastatin (CRESTOR) 40 MG tablet, Take 40 mg by mouth every evening. , Disp: , Rfl:  .  sacubitril-valsartan (ENTRESTO) 24-26 MG, Take 1 tablet by mouth 2 (two) times daily., Disp: 60 tablet, Rfl: 0 .  sodium bicarbonate 650 MG tablet, Take 650 mg by mouth 2 (two) times daily., Disp: , Rfl:  .  vitamin B-12 (CYANOCOBALAMIN) 1000 MCG tablet, Take 1 tablet (1,000 mcg total) by mouth daily., Disp: 90 tablet, Rfl: 3 .  vitamin C (ASCORBIC ACID) 250 MG tablet, Take 250 mg by mouth daily., Disp: , Rfl:  .  Vitamin D, Ergocalciferol, (DRISDOL) 50000 units CAPS capsule, Take 50,000 Units by mouth every 30 (thirty) days. , Disp: , Rfl: 1 .  zinc gluconate 50 MG tablet, Take 50 mg by mouth daily., Disp: , Rfl:  .  dapagliflozin propanediol (FARXIGA) 10 MG TABS tablet, Take 1 tablet (10 mg total) by mouth daily before breakfast., Disp: 30 tablet, Rfl: 5   Allergies  Allergen Reactions  . Gabapentin     Other reaction(s): Other (See Comments) Tremors  . Peanut-Containing Drug Products Anaphylaxis  . Penicillins Hives and Rash  Has patient had a PCN reaction causing immediate rash, facial/tongue/throat swelling, SOB or lightheadedness with hypotension: Yes Has patient had a PCN reaction causing severe rash involving mucus membranes or  skin necrosis: No Has patient had a PCN reaction that required hospitalization No Has patient had a PCN reaction occurring within the last 10 years: No If all of the above answers are "NO", then may proceed with Cephalosporin use.  . Bee Venom Swelling  . Influenza Vaccines Hives  . Inh [Isoniazid] Hives  . Kenalog [Triamcinolone Acetonide] Hives  . Levaquin [Levofloxacin] Other (See Comments)    Tendon, ligament pain.   . Nalfon [Fenoprofen Calcium] Hives  . Naproxen   . Peanut Oil   . Nsaids Rash    Nalfon 600 Nalfon 600    ROS Review of Systems  Constitutional: Negative for chills and fever.  HENT: Negative for sore throat.   Respiratory: Positive for cough, shortness of breath and wheezing. Negative for hemoptysis and sputum production.   Cardiovascular: Negative for chest pain.  Genitourinary: Negative for difficulty urinating.  Musculoskeletal: Negative for arthralgias and joint swelling.  Hematological: Negative for adenopathy.  Psychiatric/Behavioral: Negative for behavioral problems and confusion.      Objective:    Physical Exam Vitals reviewed.  Constitutional:      Appearance: Normal appearance.  HENT:     Mouth/Throat:     Mouth: Mucous membranes are moist.  Eyes:     Pupils: Pupils are equal, round, and reactive to light.  Neck:     Vascular: No carotid bruit.  Cardiovascular:     Rate and Rhythm: Normal rate and regular rhythm.     Pulses: Normal pulses.     Heart sounds: Normal heart sounds.  Pulmonary:     Effort: Pulmonary effort is normal.     Breath sounds: Wheezing and rales present.  Abdominal:     General: Bowel sounds are normal.     Palpations: Abdomen is soft. There is no hepatomegaly, splenomegaly or mass.     Tenderness: There is no abdominal tenderness.     Hernia: No hernia is present.  Musculoskeletal:     Cervical back: Neck supple.     Right lower leg: No edema.     Left lower leg: No edema.  Skin:    Findings: No rash.    Neurological:     Mental Status: He is alert and oriented to person, place, and time.     Motor: No weakness.  Psychiatric:        Mood and Affect: Mood normal.        Behavior: Behavior normal.     BP (!) 164/84   Pulse 64   Ht 5\' 5"  (1.651 m)   Wt (!) 212 lb 8 oz (96.4 kg)   SpO2 97%   BMI 35.36 kg/m  Wt Readings from Last 3 Encounters:  06/30/20 (!) 212 lb 8 oz (96.4 kg)  06/21/20 205 lb (93 kg)  05/27/20 203 lb 3.2 oz (92.2 kg)     Health Maintenance Due  Topic Date Due  . FOOT EXAM  Never done  . OPHTHALMOLOGY EXAM  Never done  . COVID-19 Vaccine (1) Never done  . TETANUS/TDAP  12/10/2013  . HEMOGLOBIN A1C  06/13/2020    There are no preventive care reminders to display for this patient.  Lab Results  Component Value Date   TSH 1.948 12/06/2018   Lab Results  Component Value Date   WBC 9.5 05/24/2020  HGB 12.4 (L) 05/24/2020   HCT 36.6 (L) 05/24/2020   MCV 89.5 05/24/2020   PLT 229 05/24/2020   Lab Results  Component Value Date   NA 137 04/18/2020   K 4.2 04/18/2020   CO2 19 (L) 04/18/2020   GLUCOSE 194 (H) 04/18/2020   BUN 52 (H) 04/18/2020   CREATININE 1.91 (H) 04/18/2020   BILITOT 0.5 04/18/2020   ALKPHOS 65 04/18/2020   AST 19 04/18/2020   ALT 12 04/18/2020   PROT 7.4 04/18/2020   ALBUMIN 4.3 04/18/2020   CALCIUM 8.9 04/18/2020   ANIONGAP 10 04/18/2020   Lab Results  Component Value Date   CHOL 115 12/29/2019   Lab Results  Component Value Date   HDL 31 (L) 12/29/2019   Lab Results  Component Value Date   LDLCALC 51 12/29/2019   Lab Results  Component Value Date   TRIG 163 (H) 12/29/2019   Lab Results  Component Value Date   CHOLHDL 3.7 12/29/2019   Lab Results  Component Value Date   HGBA1C 8.7 (H) 12/15/2019      Assessment & Plan:   Problem List Items Addressed This Visit      Respiratory   COPD (chronic obstructive pulmonary disease) ([place])    Congestive heart failure.  He was found to have few rales at the  bases.  Recently he has been treated for sinusitis by ENT.  Started on Farxiga to help to bring the blood sugar down as well work as a diuretic to get rid of of the excessive weight.  He has gained about 6 pounds.  The Entresto cannot be increased because of renal failure.  I asked him to take an extra Lasix of 20 mg p.o. daily in the evening  when weight goes down 6 to 7 pounds and he can go back to his regular dose of 40 mg p.o. daily.  See him back in 10 days for follow-up.      OSA (obstructive sleep apnea)    CPAP on a regular basis.        Endocrine   Diabetes ([place]) - Primary    Blood sugar is 106 today but his hemoglobin AIC is 8.8 about a month and a half ago.  I started him on Farxiga 10 mg p.o. daily.      Relevant Medications   dapagliflozin propanediol (FARXIGA) 10 MG TABS tablet     Genitourinary   Anemia of chronic renal failure, stage 4 (severe) (H[facility])    B12  and iron supplementation See heamatologist on  a regular basis.      CKD (chronic kidney disease) stage 4, GFR 15-29 ml/min ([facility])    Ref to nephrologist        Other   SOB (shortness of breath)    He has gained 6 pounds so we will try to get rid of it by adding 20 mg of Lasix extra every day         Meds ordered this encounter  Medications  . dapagliflozin propanediol (FARXIGA) 10 MG TABS tablet    Sig: Take 1 tablet (10 mg total) by mouth daily before breakfast.    Dispense:  30 tablet    Refill:  5    Follow-up: No follow-ups on file.    Cletis Athens, MD

## 2020-06-30 NOTE — Assessment & Plan Note (Signed)
CPAP on a regular basis.

## 2020-06-30 NOTE — Assessment & Plan Note (Signed)
Congestive heart failure.  He was found to have few rales at the bases.  Recently he has been treated for sinusitis by ENT.  Started on Farxiga to help to bring the blood sugar down as well work as a diuretic to get rid of of the excessive weight.  He has gained about 6 pounds.  The Entresto cannot be increased because of renal failure.  I asked him to take an extra Lasix of 20 mg p.o. daily in the evening  when weight goes down 6 to 7 pounds and he can go back to his regular dose of 40 mg p.o. daily.  See him back in 10 days for follow-up.

## 2020-07-07 ENCOUNTER — Other Ambulatory Visit
Admission: RE | Admit: 2020-07-07 | Discharge: 2020-07-07 | Disposition: A | Discharge status: Home / Self Care | Payer: Medicare Other | Attending: Internal Medicine | Admitting: Internal Medicine

## 2020-07-07 ENCOUNTER — Ambulatory Visit (INDEPENDENT_AMBULATORY_CARE_PROVIDER_SITE_OTHER): Payer: Medicare Other | Admitting: Internal Medicine

## 2020-07-07 ENCOUNTER — Encounter: Payer: Self-pay | Admitting: Internal Medicine

## 2020-07-07 ENCOUNTER — Other Ambulatory Visit: Payer: Self-pay

## 2020-07-07 VITALS — BP 166/67 | HR 63 | Wt 206.5 lb

## 2020-07-07 DIAGNOSIS — I509 Heart failure, unspecified: Secondary | ICD-10-CM | POA: Insufficient documentation

## 2020-07-07 DIAGNOSIS — I10 Essential (primary) hypertension: Secondary | ICD-10-CM | POA: Diagnosis not present

## 2020-07-07 DIAGNOSIS — I5042 Chronic combined systolic (congestive) and diastolic (congestive) heart failure: Secondary | ICD-10-CM | POA: Diagnosis not present

## 2020-07-07 DIAGNOSIS — I6523 Occlusion and stenosis of bilateral carotid arteries: Secondary | ICD-10-CM | POA: Diagnosis not present

## 2020-07-07 DIAGNOSIS — I5032 Chronic diastolic (congestive) heart failure: Secondary | ICD-10-CM | POA: Diagnosis not present

## 2020-07-07 DIAGNOSIS — J449 Chronic obstructive pulmonary disease, unspecified: Secondary | ICD-10-CM

## 2020-07-07 LAB — BRAIN NATRIURETIC PEPTIDE: B Natriuretic Peptide: 122.9 pg/mL — ABNORMAL HIGH (ref 0.0–100.0)

## 2020-07-07 LAB — ELECTROLYTE PANEL
Anion gap: 11 (ref 5–15)
CO2: 24 mmol/L (ref 22–32)
Chloride: 106 mmol/L (ref 98–111)
Potassium: 4.4 mmol/L (ref 3.5–5.1)
Sodium: 141 mmol/L (ref 135–145)

## 2020-07-07 NOTE — Assessment & Plan Note (Signed)
-   Today, the patient's blood pressure is not well managed on entresto and hydralazine. - The patient will change the current treatment regimen. Hydralazine was increased to 25mg  two tablets in morning and 2 tablets in the evening. Delene Loll will be increased to 2 tablets in the morning and 1 in the evening.  - I encouraged the patient to eat a low-sodium diet to help control blood pressure. - I encouraged the patient to live an active lifestyle and complete activities that increases heart rate to 85% target heart rate at least 5 times per week for one hour.

## 2020-07-07 NOTE — Assessment & Plan Note (Signed)
Pt takes symbocart for his COPD regularly. Pt is no longer a smoker.

## 2020-07-07 NOTE — Assessment & Plan Note (Signed)
Pt still is wheezing and has few rhonci i in the chest. Will check BMP and electrolyte today in the hospital

## 2020-07-07 NOTE — Addendum Note (Signed)
Addended by: Joycie Peek on: 07/07/2020 01:03 PM   Modules accepted: Orders

## 2020-07-07 NOTE — Addendum Note (Signed)
Addended by: Joycie Peek on: 07/07/2020 01:04 PM   Modules accepted: Orders

## 2020-07-07 NOTE — Progress Notes (Signed)
Established Patient Office Visit  SUBJECTIVE:  Subjective  Patient ID: Mike Decker, male    DOB: 28-Jan-1941  Age: 79 y.o. MRN: [id number]  CC:  Chief Complaint  Patient presents with  . discuss changing medication    Insurance won't cover the current medicine    HPI Mike Decker is a 79 y.o. male presenting today for SOB, and medications. Pt has lost weight. He is taking 60mg  lasix for his congestive heart failure and entresto. Pt was told not to increase his entresto dose. He is also on hydralazine. Pt is seeing cardiologist. He has a cough that has clear phlegm produced with it. He uses CPAP, humidifier, and has raised the head of the bed up to help him sleep. Pt sterilizes his humidifier machine daily.   Past Medical History:  Diagnosis Date  . Anemia   . Arthritis   . Atrioventricular canal (AVC)    irregular heart beats  . Barrett esophagus   . 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 Chronic diastolic CHF (congestive heart failure) ([place])   . Colon polyp   . Diabetes mellitus without complication ([place])   . Diverticulosis   . Gout   . Heart disease   . Hemangioma    liver  . Hyperlipidemia   . Hypertension   . Myocardial infarct ([place])   . Ocular hypertension   . Peripheral vascular disease ([place])   . Skin cancer   . 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[place])   . Sleep apnea   . Vitreoretinal degeneration     Past Surgical History:  Procedure Laterality Date  . CATARACT EXTRACTION  2011, 2012  . COLONOSCOPY N/A 12/14/2018   Procedure: COLONOSCOPY;  Surgeon: Virgel Manifold, MD;  Location: [facility] ENDOSCOPY;  Service: Endoscopy;  Laterality: N/A;  . CORONARY ANGIOPLASTY WITH STENT PLACEMENT  2012  . CORONARY STENT INTERVENTION N/A 12/29/2019   Procedure: CORONARY STENT INTERVENTION;  Surgeon: Nelva Bush, MD;  Location: [facility];  Service: Cardiovascular;  Laterality: N/A;  . ESOPHAGOGASTRODUODENOSCOPY N/A 12/14/2018   Procedure:  ESOPHAGOGASTRODUODENOSCOPY (EGD);  Surgeon: Virgel Manifold, MD;  Location[facility]COPY;  Service: Endoscopy;  Laterality: N/A;  . ESOPHAGOGASTRODUODENOSCOPY (EGD) WITH PROPOFOL N/A 06/01/2016   Procedure: ESOPHAGOGASTRODUODENOSCOPY (EGD) WITH PROPOFOL;  Surgeon: Manya Silvas, MD;  Location: [facility] ENDOSCOPY;  Service: Endoscopy;  Laterality: N/A;  . HERNIA REPAIR     x2  . INTRAOCULAR LENS INSERTION    . LEFT HEART CATH AND CORONARY ANGIOGRAPHY N/A 05/09/2017   Procedure: Left Heart Cath and Coronary Angiography;  Surgeon: Yolonda Kida, MD;  Location: [place] [facility];  Service: Cardiovascular;  Laterality: N/A;  . LEFT HEART CATH AND CORONARY ANGIOGRAPHY N/A 12/08/2018   Procedure: LEFT HEART CATH AND CORONARY ANGIOGRAPHY and possible PCI and stent;  Surgeon: Yolonda Kida, MD;  Location: [place] [facility] LAB;  Service: Cardiovascular;  Laterality: N/A;  . LEFT HEART CATH AND CORONARY ANGIOGRAPHY N/A 12/29/2019   Procedure: LEFT HEART CATH AND CORONARY ANGIOGRAPHY;  Surgeon: Nelva Bush, MD;  Location: [place] [facility];  Service: Cardiovascular;  Laterality: N/A;  . NOSE SURGERY     submucous resection  . PROSTATE SURGERY  2002  . ROTATOR CUFF REPAIR Right     Family History  Problem Relation Age of Onset  . Arthritis Mother   . Stroke Maternal Grandfather   . Breast cancer Neg Hx     Social History   Socioeconomic History  .  Marital status: Married    Spouse name: Not on file  . Number of children: Not on file  . Years of education: Not on file  . Highest education level: Not on file  Occupational History  . Not on file  Tobacco Use  . Smoking status: Former Smoker    Years: 20.00    Types: Cigarettes    Quit date: 12/10/1976    Years since quitting: 43.6  . Smokeless tobacco: Never Used  Substance and Sexual Activity  . Alcohol use: No  . Drug use: No  . Sexual activity: Not on file  Other Topics Concern  . Not on file  Social History  Narrative  . Not on file   Social Determinants of Health   Financial Resource Strain:   . Difficulty of Paying Living Expenses:   Food Insecurity:   . Worried About Charity fundraiser in the Last Year:   . Arboriculturist in the Last Year:   Transportation Needs:   . Film/video editor (Medical):   Marland Kitchen Lack of Transportation (Non-Medical):   Physical Activity:   . Days of Exercise per Week:   . Minutes of Exercise per Session:   Stress:   . Feeling of Stress :   Social Connections:   . Frequency of Communication with Friends and Family:   . Frequency of Social Gatherings with Friends and Family:   . Attends Religious Services:   . Active Member of Clubs or Organizations:   . Attends Archivist Meetings:   Marland Kitchen Marital Status:   Intimate Partner Violence:   . Fear of Current or Ex-Partner:   . Emotionally Abused:   Marland Kitchen Physically Abused:   . Sexually Abused:      Current Outpatient Medications:  .  acetaminophen (TYLENOL) 500 MG tablet, Take 1,000 mg by mouth every 6 (six) hours as needed for mild pain, moderate pain or headache., Disp: , Rfl:  .  allopurinol (ZYLOPRIM) 100 MG tablet, Take 100 mg by mouth daily. , Disp: , Rfl:  .  amLODipine (NORVASC) 5 MG tablet, Take 5 mg by mouth 2 (two) times daily. , Disp: , Rfl:  .  aspirin EC 81 MG EC tablet, Take 1 tablet (81 mg total) by mouth daily., Disp: 60 tablet, Rfl: 0 .  azelastine (ASTELIN) 0.1 % nasal spray, USE 1 TO 2 SPRAYS IN EACH NOSTRIL TWICE A DAY, Disp: , Rfl:  .  budesonide-formoterol (SYMBICORT) 160-4.5 MCG/ACT inhaler, Inhale 2 puffs into the lungs 2 (two) times daily as needed. , Disp: , Rfl:  .  cetirizine (ZYRTEC) 5 MG tablet, Take 5 mg by mouth daily as needed for allergies or rhinitis. , Disp: , Rfl:  .  citalopram (CELEXA) 10 MG tablet, Take 10 mg by mouth daily., Disp: , Rfl:  .  clopidogrel (PLAVIX) 75 MG tablet, Take 1 tablet (75 mg total) by mouth daily., Disp: 30 tablet, Rfl: 11 .  colchicine  0.6 MG tablet, Take 3 tablets (1.8 mg total) by mouth daily as needed (gout flares)., Disp: , Rfl:  .  dapagliflozin propanediol (FARXIGA) 10 MG TABS tablet, Take 1 tablet (10 mg total) by mouth daily before breakfast., Disp: 30 tablet, Rfl: 5 .  dextromethorphan-guaiFENesin (MUCINEX DM) 30-600 MG 12hr tablet, Take 1 tablet by mouth 2 (two) times daily as needed for cough., Disp: , Rfl:  .  EPINEPHrine (EPI-PEN) 0.3 mg/0.3 mL SOAJ injection, Inject into the muscle as needed (anaphylaxis). , Disp: ,  Rfl:  .  ferrous sulfate 325 (65 FE) MG EC tablet, Take 1 tablet (325 mg total) by mouth daily., Disp: 90 tablet, Rfl: 3 .  fluticasone (FLONASE) 50 MCG/ACT nasal spray, Place 2 sprays into both nostrils 2 (two) times daily. , Disp: , Rfl: 3 .  folic acid (V-R FOLIC ACID) 517 MCG tablet, Take 1 tablet (400 mcg total) by mouth daily., Disp: 90 tablet, Rfl: 3 .  furosemide (LASIX) 20 MG tablet, Take 2 tablets (40 mg total) by mouth daily., Disp: , Rfl:  .  hydrALAZINE (APRESOLINE) 25 MG tablet, Take 1 tablet (25 mg total) by mouth every 8 (eight) hours. (Patient taking differently: Take 25 mg by mouth 3 (three) times daily. ), Disp: 60 tablet, Rfl: 0 .  insulin aspart (NOVOLOG) 100 UNIT/ML FlexPen, Take up to 100 units daily in divided doses, as advised., Disp: , Rfl:  .  insulin glargine (LANTUS) 100 UNIT/ML injection, Inject 0.35 mLs (35 Units total) into the skin at bedtime. This is a decrease from your previous 45 units nightly., Disp: 10 mL, Rfl: 11 .  ipratropium-albuterol (DUONEB) 0.5-2.5 (3) MG/3ML SOLN, Take 3 mLs by nebulization every 4 (four) hours as needed., Disp: 360 mL, Rfl: 0 .  isosorbide mononitrate (IMDUR) 60 MG 24 hr tablet, Take 60 mg by mouth daily. , Disp: , Rfl:  .  Magnesium 250 MG TABS, Take 250 mg by mouth daily., Disp: , Rfl:  .  methocarbamol (ROBAXIN) 500 MG tablet, Take 500 mg by mouth 2 (two) times daily. , Disp: , Rfl: 0 .  metoprolol tartrate (LOPRESSOR) 50 MG tablet, Take 25  mg by mouth 3 (three) times daily. , Disp: , Rfl:  .  montelukast (SINGULAIR) 10 MG tablet, TAKE 1 TABLET BY MOUTH EVERYDAY AT BEDTIME, Disp: 90 tablet, Rfl: 1 .  nitroGLYCERIN (NITROSTAT) 0.4 MG SL tablet, Place 0.4 mg under the tongue every 5 (five) minutes x 3 doses as needed for chest pain. , Disp: , Rfl:  .  Omega-3 Fatty Acids (FISH OIL) 1000 MG CAPS, Take 1 capsule by mouth daily. , Disp: , Rfl:  .  pantoprazole (PROTONIX) 40 MG tablet, Take 1 tablet (40 mg total) by mouth 2 (two) times daily before a meal., Disp: , Rfl:  .  rosuvastatin (CRESTOR) 40 MG tablet, Take 40 mg by mouth every evening. , Disp: , Rfl:  .  sacubitril-valsartan (ENTRESTO) 24-26 MG, Take 1 tablet by mouth 2 (two) times daily., Disp: 60 tablet, Rfl: 0 .  sodium bicarbonate 650 MG tablet, Take 650 mg by mouth 2 (two) times daily., Disp: , Rfl:  .  vitamin B-12 (CYANOCOBALAMIN) 1000 MCG tablet, Take 1 tablet (1,000 mcg total) by mouth daily., Disp: 90 tablet, Rfl: 3 .  vitamin C (ASCORBIC ACID) 250 MG tablet, Take 250 mg by mouth daily., Disp: , Rfl:  .  Vitamin D, Ergocalciferol, (DRISDOL) 50000 units CAPS capsule, Take 50,000 Units by mouth every 30 (thirty) days. , Disp: , Rfl: 1 .  zinc gluconate 50 MG tablet, Take 50 mg by mouth daily., Disp: , Rfl:    Allergies  Allergen Reactions  . Gabapentin     Other reaction(s): Other (See Comments) Tremors  . Peanut-Containing Drug Products Anaphylaxis  . Penicillins Hives and Rash    Has patient had a PCN reaction causing immediate rash, facial/tongue/throat swelling, SOB or lightheadedness with hypotension: Yes Has patient had a PCN reaction causing severe rash involving mucus membranes or skin necrosis: No Has patient  had a PCN reaction that required hospitalization No Has patient had a PCN reaction occurring within the last 10 years: No If all of the above answers are "NO", then may proceed with Cephalosporin use.  . Bee Venom Swelling  . Influenza Vaccines Hives    . Inh [Isoniazid] Hives  . Kenalog [Triamcinolone Acetonide] Hives  . Levaquin [Levofloxacin] Other (See Comments)    Tendon, ligament pain.   . Nalfon [Fenoprofen Calcium] Hives  . Naproxen   . Peanut Oil   . Nsaids Rash    Nalfon 600 Nalfon 600    ROS Review of Systems  Constitutional: Negative.   HENT: Negative.   Eyes: Negative.   Respiratory: Positive for cough and shortness of breath.   Cardiovascular: Negative.   Gastrointestinal: Negative.   Endocrine: Negative.   Genitourinary: Negative.   Musculoskeletal: Negative.   Skin: Negative.   Allergic/Immunologic: Negative.   Neurological: Negative.   Hematological: Negative.   Psychiatric/Behavioral: Negative.   All other systems reviewed and are negative.    OBJECTIVE:    Physical Exam Vitals reviewed.  Constitutional:      Appearance: Normal appearance.  HENT:     Mouth/Throat:     Mouth: Mucous membranes are moist.  Eyes:     Pupils: Pupils are equal, round, and reactive to light.  Neck:     Vascular: No carotid bruit.  Cardiovascular:     Rate and Rhythm: Normal rate and regular rhythm.     Pulses: Normal pulses.     Heart sounds: Normal heart sounds.  Pulmonary:     Effort: Pulmonary effort is normal.     Breath sounds: Normal breath sounds.  Abdominal:     General: Bowel sounds are normal.     Palpations: Abdomen is soft. There is no hepatomegaly, splenomegaly or mass.     Tenderness: There is no abdominal tenderness.     Hernia: No hernia is present.  Musculoskeletal:     Cervical back: Neck supple.     Right lower leg: No edema.     Left lower leg: No edema.  Skin:    Findings: No rash.  Neurological:     Mental Status: He is alert and oriented to person, place, and time.     Motor: No weakness.  Psychiatric:        Mood and Affect: Mood normal.        Behavior: Behavior normal.     BP (!) 166/67   Pulse 63   Wt (!) 206 lb 8 oz (93.7 kg)   BMI 34.36 kg/m  Wt Readings from Last 3  Encounters:  07/07/20 (!) 206 lb 8 oz (93.7 kg)  06/30/20 (!) 212 lb 8 oz (96.4 kg)  06/21/20 205 lb (93 kg)    Health Maintenance Due  Topic Date Due  . FOOT EXAM  Never done  . OPHTHALMOLOGY EXAM  Never done  . COVID-19 Vaccine (1) Never done  . TETANUS/TDAP  12/10/2013  . HEMOGLOBIN A1C  06/13/2020    There are no preventive care reminders to display for this patient.  CBC Latest Ref Rng & Units 05/24/2020 04/18/2020 12/30/2019  WBC 4.0 - 10.5 K/uL 9.5 10.6(H) 9.1  Hemoglobin 13.0 - 17.0 g/dL 12.4(L) 12.6(L) 11.1(L)  Hematocrit 39 - 52 % 36.6(L) 38.1(L) 35.3(L)  Platelets 150 - 400 K/uL 229 239 266   CMP Latest Ref Rng & Units 04/18/2020 01/04/2020 12/30/2019  Glucose 70 - 99 mg/dL 194(H) 85 88  BUN 8 -  23 mg/dL 52(H) 44(H) 32(H)  Creatinine 0.61 - 1.24 mg/dL 1.91(H) 1.93(H) 1.48(H)  Sodium 135 - 145 mmol/L 137 138 139  Potassium 3.5 - 5.1 mmol/L 4.2 5.2(H) 4.2  Chloride 98 - 111 mmol/L 108 108 109  CO2 22 - 32 mmol/L 19(L) 21(L) 22  Calcium 8.9 - 10.3 mg/dL 8.9 9.2 8.6(L)  Total Protein 6.5 - 8.1 g/dL 7.4 - -  Total Bilirubin 0.3 - 1.2 mg/dL 0.5 - -  Alkaline Phos 38 - 126 U/L 65 - -  AST 15 - 41 U/L 19 - -  ALT 0 - 44 U/L 12 - -    Lab Results  Component Value Date   TSH 1.948 12/06/2018   Lab Results  Component Value Date   ALBUMIN 4.3 04/18/2020   ANIONGAP 10 04/18/2020   Lab Results  Component Value Date   CHOL 115 12/29/2019   CHOL 107 12/15/2019   CHOL 112 11/15/2018   HDL 31 (L) 12/29/2019   HDL 22 (L) 12/15/2019   HDL 27 (L) 11/15/2018   LDLCALC 51 12/29/2019   LDLCALC 33 12/15/2019   LDLCALC 51 11/15/2018   CHOLHDL 3.7 12/29/2019   CHOLHDL 4.9 12/15/2019   CHOLHDL 4.1 11/15/2018   Lab Results  Component Value Date   TRIG 163 (H) 12/29/2019   Lab Results  Component Value Date   HGBA1C 8.7 (H) 12/15/2019   HGBA1C 8.5 (H) 12/14/2019   HGBA1C 7.2 (H) 11/15/2018      ASSESSMENT & PLAN:   Problem List Items Addressed This Visit       Cardiovascular and Mediastinum   Chronic diastolic heart failure (HCC) - Primary (Chronic)   Relevant Orders   B Nat Peptide   Electrolyte panel   Essential hypertension    - Today, the patient's blood pressure is not well managed on entresto and hydralazine. - The patient will change the current treatment regimen. Hydralazine was increased to 25mg  two tablets in morning and 2 tablets in the evening. Delene Loll will be increased to 2 tablets in the morning and 1 in the evening.  - I encouraged the patient to eat a low-sodium diet to help control blood pressure. - I encouraged the patient to live an active lifestyle and complete activities that increases heart rate to 85% target heart rate at least 5 times per week for one hour.          Heart failure (HCC)    Pt still is wheezing and has few rhonci i in the chest. Will check BMP and electrolyte today in the hospital      Relevant Orders   B Nat Peptide   Electrolyte panel     Respiratory   COPD (chronic obstructive pulmonary disease) (HCC)    Pt takes symbocart for his COPD regularly. Pt is no longer a smoker.       Other Visit Diagnoses    Congestive heart failure, unspecified HF chronicity, unspecified heart failure type ([place])       Relevant Orders   B Nat Peptide   Electrolyte panel      No orders of the defined types were placed in this encounter.     Follow-up: Return in about 1 week (around 07/14/2020).    Dr. Jane Canary [facility] [address], [place], [place] [address]   By signing my name below, I, Dawayne Cirri, attest that this documentation has been prepared under the direction and in the presence of Cletis Athens, MD. Electronically  Signed: Cletis Athens, MD 07/07/20, 12:37 PM   I personally performed the services described in this documentation, which was SCRIBED in my presence. The recorded information has been reviewed and considered accurate. It has been edited as necessary during  review. Cletis Athens, MD

## 2020-07-15 ENCOUNTER — Encounter: Payer: Self-pay | Admitting: Internal Medicine

## 2020-07-15 ENCOUNTER — Ambulatory Visit (INDEPENDENT_AMBULATORY_CARE_PROVIDER_SITE_OTHER): Payer: Medicare Other | Admitting: Internal Medicine

## 2020-07-15 ENCOUNTER — Other Ambulatory Visit: Payer: Self-pay

## 2020-07-15 VITALS — BP 169/71 | HR 77 | Wt 206.1 lb

## 2020-07-15 DIAGNOSIS — I5042 Chronic combined systolic (congestive) and diastolic (congestive) heart failure: Secondary | ICD-10-CM

## 2020-07-15 DIAGNOSIS — R002 Palpitations: Secondary | ICD-10-CM | POA: Diagnosis not present

## 2020-07-15 DIAGNOSIS — I6523 Occlusion and stenosis of bilateral carotid arteries: Secondary | ICD-10-CM | POA: Diagnosis not present

## 2020-07-15 DIAGNOSIS — I700 Atherosclerosis of aorta: Secondary | ICD-10-CM

## 2020-07-15 DIAGNOSIS — R0602 Shortness of breath: Secondary | ICD-10-CM

## 2020-07-15 DIAGNOSIS — J40 Bronchitis, not specified as acute or chronic: Secondary | ICD-10-CM | POA: Diagnosis not present

## 2020-07-15 MED ORDER — FUROSEMIDE 20 MG PO TABS: 20.0000 mg | ORAL_TABLET | Freq: Once | ORAL | Status: DC

## 2020-07-15 MED ORDER — FUROSEMIDE 10 MG/ML IJ SOLN
20.0000 mg | Freq: Once | INTRAMUSCULAR | Status: AC
  Administered 2020-07-15: 20 mg via INTRAMUSCULAR

## 2020-07-15 MED ORDER — SACUBITRIL-VALSARTAN 49-51 MG PO TABS: 1.0000 | ORAL_TABLET | Freq: Two times a day (BID) | ORAL | Status: AC

## 2020-07-15 MED ORDER — AZITHROMYCIN 250 MG PO TABS: ORAL_TABLET | ORAL | 0 refills | Status: DC

## 2020-07-15 NOTE — Assessment & Plan Note (Signed)
I gave him Zithromax as directed.

## 2020-07-15 NOTE — Assessment & Plan Note (Signed)
He was found to be wheezing in the chest. I increased his entresto to 49 BID.

## 2020-07-15 NOTE — Assessment & Plan Note (Signed)
Pt was found to be wheezing today. He also feels tired and fatigued. He denied a Hx of fever or chills. He has 2+ pedal edema in the right leg and 1+ in the left. He also complained of cough, worse at night, with productive sputnum which is mucus-like. I gave him Zithromax as directed. I also gave him Lasix 20mg  IM. His Potassium was ok. BNP was normal.

## 2020-07-15 NOTE — Progress Notes (Addendum)
Established Patient Office Visit  SUBJECTIVE:  Subjective  Patient ID: Mike Decker, male    DOB: 14-May-1941  Age: 79 y.o. MRN: [id number]  CC:  Chief Complaint  Patient presents with  . chronic diastolic heart failure    2 week fu     HPI Mike Decker is a 79 y.o. male presenting today for a follow up of his chronic diastolic heart failure.   Cough The problem has been gradually improving. The problem occurs every few minutes. The cough is productive of sputum (yellow, occasionaly). Associated symptoms include nasal congestion, postnasal drip and shortness of breath. Pertinent negatives include no chest pain, chills, fever or weight loss (weight gain). Associated symptoms comments: palpatations. The symptoms are aggravated by lying down and exercise (Pt using a wedge pillow). He has tried rest for the symptoms. The treatment provided no relief. There is no history of pneumonia.  Congestive Heart Failure Presents for follow-up visit. Associated symptoms include fatigue, palpitations, shortness of breath and unexpected weight change (gain). Pertinent negatives include no chest pain, chest pressure or edema. The symptoms have been improving. Compliance with total regimen is 76-100%. Compliance with medications is 76-100%.     Past Medical History:  Diagnosis Date  . Anemia   . Arthritis   . Atrioventricular canal (AVC)    irregular heart beats  . Barrett esophagus   . 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 Chronic diastolic CHF (congestive heart failure) ([place])   . Colon polyp   . Diabetes mellitus without complication ([place])   . Diverticulosis   . Gout   . Heart disease   . Hemangioma    liver  . Hyperlipidemia   . Hypertension   . Myocardial infarct ([place])   . Ocular hypertension   . Peripheral vascular disease ([place])   . Skin cancer   . 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[place])   . Sleep apnea   . Vitreoretinal degeneration     Past Surgical History:  Procedure  Laterality Date  . CATARACT EXTRACTION  2011, 2012  . COLONOSCOPY N/A 12/14/2018   Procedure: COLONOSCOPY;  Surgeon: Virgel Manifold, MD;  Location: [facility] ENDOSCOPY;  Service: Endoscopy;  Laterality: N/A;  . CORONARY ANGIOPLASTY WITH STENT PLACEMENT  2012  . CORONARY STENT INTERVENTION N/A 12/29/2019   Procedure: CORONARY STENT INTERVENTION;  Surgeon: Nelva Bush, MD;  Location: [place] [facility];  Service: Cardiovascular;  Laterality: N/A;  . ESOPHAGOGASTRODUODENOSCOPY N/A 12/14/2018   Procedure: ESOPHAGOGASTRODUODENOSCOPY (EGD);  Surgeon: Virgel Manifold, MD;  [facility];  Service: Endoscopy;  Laterality: N/A;  . ESOPHAGOGASTRODUODENOSCOPY (EGD) WITH PROPOFOL N/A 06/01/2016   Procedure: ESOPHAGOGASTRODUODENOSCOPY (EGD) WITH PROPOFOL;  Surgeon: Manya Silvas, MD;  Location: [facility] ENDOSCOPY;  Service: Endoscopy;  Laterality: N/A;  . HERNIA REPAIR     x2  . INTRAOCULAR LENS INSERTION    . LEFT HEART CATH AND CORONARY ANGIOGRAPHY N/A 05/09/2017   Procedure: Left Heart Cath and Coronary Angiography;  Surgeon: Yolonda Kida, MD;  Location: [place] [facility] LAB;  Service: Cardiovascular;  Laterality: N/A;  . LEFT HEART CATH AND CORONARY ANGIOGRAPHY N/A 12/08/2018   Procedure: LEFT HEART CATH AND CORONARY ANGIOGRAPHY and possible PCI and stent;  Surgeon: Yolonda Kida, MD;  Location: [place] [facility];  Service: Cardiovascular;  Laterality: N/A;  . LEFT HEART CATH AND CORONARY ANGIOGRAPHY N/A 12/29/2019   Procedure: LEFT HEART CATH AND CORONARY ANGIOGRAPHY;  Surgeon: Nelva Bush, MD;  Location: [place] [facility] LAB;  Service: Cardiovascular;  Laterality: N/A;  . NOSE SURGERY     submucous resection  . PROSTATE SURGERY  2002  . ROTATOR CUFF REPAIR Right     Family History  Problem Relation Age of Onset  . Arthritis Mother   . Stroke Maternal Grandfather   . Breast cancer Neg Hx     Social History   Socioeconomic History  . Marital status:  Married    Spouse name: Not on file  . Number of children: Not on file  . Years of education: Not on file  . Highest education level: Not on file  Occupational History  . Not on file  Tobacco Use  . Smoking status: Former Smoker    Years: 20.00    Types: Cigarettes    Quit date: 12/10/1976    Years since quitting: 43.6  . Smokeless tobacco: Never Used  Substance and Sexual Activity  . Alcohol use: No  . Drug use: No  . Sexual activity: Not on file  Other Topics Concern  . Not on file  Social History Narrative  . Not on file   Social Determinants of Health   Financial Resource Strain:   . Difficulty of Paying Living Expenses:   Food Insecurity:   . Worried About Charity fundraiser in the Last Year:   . Arboriculturist in the Last Year:   Transportation Needs:   . Film/video editor (Medical):   Marland Kitchen Lack of Transportation (Non-Medical):   Physical Activity:   . Days of Exercise per Week:   . Minutes of Exercise per Session:   Stress:   . Feeling of Stress :   Social Connections:   . Frequency of Communication with Friends and Family:   . Frequency of Social Gatherings with Friends and Family:   . Attends Religious Services:   . Active Member of Clubs or Organizations:   . Attends Archivist Meetings:   Marland Kitchen Marital Status:   Intimate Partner Violence:   . Fear of Current or Ex-Partner:   . Emotionally Abused:   Marland Kitchen Physically Abused:   . Sexually Abused:      Current Outpatient Medications:  .  acetaminophen (TYLENOL) 500 MG tablet, Take 1,000 mg by mouth every 6 (six) hours as needed for mild pain, moderate pain or headache., Disp: , Rfl:  .  allopurinol (ZYLOPRIM) 100 MG tablet, Take 100 mg by mouth daily. , Disp: , Rfl:  .  amLODipine (NORVASC) 5 MG tablet, Take 5 mg by mouth 2 (two) times daily. , Disp: , Rfl:  .  aspirin EC 81 MG EC tablet, Take 1 tablet (81 mg total) by mouth daily., Disp: 60 tablet, Rfl: 0 .  azelastine (ASTELIN) 0.1 % nasal spray,  USE 1 TO 2 SPRAYS IN EACH NOSTRIL TWICE A DAY, Disp: , Rfl:  .  budesonide-formoterol (SYMBICORT) 160-4.5 MCG/ACT inhaler, Inhale 2 puffs into the lungs 2 (two) times daily as needed. , Disp: , Rfl:  .  cetirizine (ZYRTEC) 5 MG tablet, Take 5 mg by mouth daily as needed for allergies or rhinitis. , Disp: , Rfl:  .  citalopram (CELEXA) 10 MG tablet, Take 10 mg by mouth daily., Disp: , Rfl:  .  clopidogrel (PLAVIX) 75 MG tablet, Take 1 tablet (75 mg total) by mouth daily., Disp: 30 tablet, Rfl: 11 .  colchicine 0.6 MG tablet, Take 3 tablets (1.8 mg total) by mouth daily as needed (gout flares)., Disp: , Rfl:  .  dapagliflozin propanediol (FARXIGA) 10 MG TABS tablet, Take 1 tablet (10 mg total) by mouth daily before breakfast., Disp: 30 tablet, Rfl: 5 .  dextromethorphan-guaiFENesin (MUCINEX DM) 30-600 MG 12hr tablet, Take 1 tablet by mouth 2 (two) times daily as needed for cough., Disp: , Rfl:  .  EPINEPHrine (EPI-PEN) 0.3 mg/0.3 mL SOAJ injection, Inject into the muscle as needed (anaphylaxis). , Disp: , Rfl:  .  ferrous sulfate 325 (65 FE) MG EC tablet, Take 1 tablet (325 mg total) by mouth daily., Disp: 90 tablet, Rfl: 3 .  fluticasone (FLONASE) 50 MCG/ACT nasal spray, Place 2 sprays into both nostrils 2 (two) times daily. , Disp: , Rfl: 3 .  folic acid (V-R FOLIC ACID) 295 MCG tablet, Take 1 tablet (400 mcg total) by mouth daily., Disp: 90 tablet, Rfl: 3 .  furosemide (LASIX) 20 MG tablet, Take 2 tablets (40 mg total) by mouth daily., Disp: , Rfl:  .  hydrALAZINE (APRESOLINE) 25 MG tablet, Take 1 tablet (25 mg total) by mouth every 8 (eight) hours. (Patient taking differently: Take 25 mg by mouth 3 (three) times daily. ), Disp: 60 tablet, Rfl: 0 .  insulin aspart (NOVOLOG) 100 UNIT/ML FlexPen, Take up to 100 units daily in divided doses, as advised., Disp: , Rfl:  .  insulin glargine (LANTUS) 100 UNIT/ML injection, Inject 0.35 mLs (35 Units total) into the skin at bedtime. This is a decrease from  your previous 45 units nightly., Disp: 10 mL, Rfl: 11 .  ipratropium-albuterol (DUONEB) 0.5-2.5 (3) MG/3ML SOLN, Take 3 mLs by nebulization every 4 (four) hours as needed., Disp: 360 mL, Rfl: 0 .  isosorbide mononitrate (IMDUR) 60 MG 24 hr tablet, Take 60 mg by mouth daily. , Disp: , Rfl:  .  Magnesium 250 MG TABS, Take 250 mg by mouth daily., Disp: , Rfl:  .  methocarbamol (ROBAXIN) 500 MG tablet, Take 500 mg by mouth 2 (two) times daily. , Disp: , Rfl: 0 .  metoprolol tartrate (LOPRESSOR) 50 MG tablet, Take 25 mg by mouth 3 (three) times daily. , Disp: , Rfl:  .  montelukast (SINGULAIR) 10 MG tablet, TAKE 1 TABLET BY MOUTH EVERYDAY AT BEDTIME, Disp: 90 tablet, Rfl: 1 .  nitroGLYCERIN (NITROSTAT) 0.4 MG SL tablet, Place 0.4 mg under the tongue every 5 (five) minutes x 3 doses as needed for chest pain. , Disp: , Rfl:  .  Omega-3 Fatty Acids (FISH OIL) 1000 MG CAPS, Take 1 capsule by mouth daily. , Disp: , Rfl:  .  pantoprazole (PROTONIX) 40 MG tablet, Take 1 tablet (40 mg total) by mouth 2 (two) times daily before a meal., Disp: , Rfl:  .  rosuvastatin (CRESTOR) 40 MG tablet, Take 40 mg by mouth every evening. , Disp: , Rfl:  .  sodium bicarbonate 650 MG tablet, Take 650 mg by mouth 2 (two) times daily., Disp: , Rfl:  .  vitamin B-12 (CYANOCOBALAMIN) 1000 MCG tablet, Take 1 tablet (1,000 mcg total) by mouth daily., Disp: 90 tablet, Rfl: 3 .  vitamin C (ASCORBIC ACID) 250 MG tablet, Take 250 mg by mouth daily., Disp: , Rfl:  .  Vitamin D, Ergocalciferol, (DRISDOL) 50000 units CAPS capsule, Take 50,000 Units by mouth every 30 (thirty) days. , Disp: , Rfl: 1 .  zinc gluconate 50 MG tablet, Take 50 mg by mouth daily., Disp: , Rfl:  .  azithromycin (ZITHROMAX) 250 MG tablet, 2 tab daily for 3 days, Disp: 6 tablet, Rfl: 0  Current Facility-Administered  Medications:  .  sacubitril-valsartan (ENTRESTO) 49-51 mg per tablet, 1 tablet, Oral, BID, Cletis Athens, MD   Allergies  Allergen Reactions  .  Gabapentin     Other reaction(s): Other (See Comments) Tremors  . Peanut-Containing Drug Products Anaphylaxis  . Penicillins Hives and Rash    Has patient had a PCN reaction causing immediate rash, facial/tongue/throat swelling, SOB or lightheadedness with hypotension: Yes Has patient had a PCN reaction causing severe rash involving mucus membranes or skin necrosis: No Has patient had a PCN reaction that required hospitalization No Has patient had a PCN reaction occurring within the last 10 years: No If all of the above answers are "NO", then may proceed with Cephalosporin use.  . Bee Venom Swelling  . Influenza Vaccines Hives  . Inh [Isoniazid] Hives  . Kenalog [Triamcinolone Acetonide] Hives  . Levaquin [Levofloxacin] Other (See Comments)    Tendon, ligament pain.   . Nalfon [Fenoprofen Calcium] Hives  . Naproxen   . Peanut Oil   . Nsaids Rash    Nalfon 600 Nalfon 600    ROS Review of Systems  Constitutional: Positive for fatigue and unexpected weight change (gain). Negative for chills, fever and weight loss (weight gain).  HENT: Positive for postnasal drip.   Eyes: Negative.   Respiratory: Positive for cough and shortness of breath.   Cardiovascular: Positive for palpitations. Negative for chest pain.  Gastrointestinal: Negative.   Endocrine: Negative.   Genitourinary: Negative.   Musculoskeletal: Negative.   Skin: Negative.   Allergic/Immunologic: Negative.   Neurological: Negative.   Hematological: Negative.   Psychiatric/Behavioral: Negative.   All other systems reviewed and are negative.    OBJECTIVE:    Physical Exam Vitals reviewed.  Constitutional:      Appearance: Normal appearance.  HENT:     Mouth/Throat:     Mouth: Mucous membranes are moist.  Eyes:     Pupils: Pupils are equal, round, and reactive to light.  Neck:     Vascular: No carotid bruit.  Cardiovascular:     Rate and Rhythm: Normal rate and regular rhythm.     Pulses: Normal pulses.      Heart sounds: Normal heart sounds.  Pulmonary:     Effort: Pulmonary effort is normal. No respiratory distress.     Breath sounds: Rales (improved) present.     Comments: Pt coughing during visit. Can hear him clearing throat of sputum.  Abdominal:     General: Bowel sounds are normal.     Palpations: Abdomen is soft. There is no hepatomegaly, splenomegaly or mass.     Tenderness: There is no abdominal tenderness.     Hernia: No hernia is present.  Musculoskeletal:     Cervical back: Neck supple.     Right lower leg: No edema.     Left lower leg: No edema.  Skin:    Findings: No rash.  Neurological:     Mental Status: He is alert and oriented to person, place, and time.     Motor: No weakness.  Psychiatric:        Mood and Affect: Mood normal.        Behavior: Behavior normal.     BP (!) 169/71   Pulse 77   Wt 206 lb 1.6 oz (93.5 kg)   BMI 34.30 kg/m  Wt Readings from Last 3 Encounters:  07/15/20 206 lb 1.6 oz (93.5 kg)  07/07/20 (!) 206 lb 8 oz (93.7 kg)  06/30/20 (!) 212 lb 8  oz (96.4 kg)    Health Maintenance Due  Topic Date Due  . FOOT EXAM  Never done  . OPHTHALMOLOGY EXAM  Never done  . COVID-19 Vaccine (1) Never done  . TETANUS/TDAP  12/10/2013  . HEMOGLOBIN A1C  06/13/2020  . INFLUENZA VACCINE  07/10/2020    There are no preventive care reminders to display for this patient.  CBC Latest Ref Rng & Units 05/24/2020 04/18/2020 12/30/2019  WBC 4.0 - 10.5 K/uL 9.5 10.6(H) 9.1  Hemoglobin 13.0 - 17.0 g/dL 12.4(L) 12.6(L) 11.1(L)  Hematocrit 39 - 52 % 36.6(L) 38.1(L) 35.3(L)  Platelets 150 - 400 K/uL 229 239 266   CMP Latest Ref Rng & Units 07/07/2020 04/18/2020 01/04/2020  Glucose 70 - 99 mg/dL - 194(H) 85  BUN 8 - 23 mg/dL - 52(H) 44(H)  Creatinine 0.61 - 1.24 mg/dL - 1.91(H) 1.93(H)  Sodium 135 - 145 mmol/L 141 137 138  Potassium 3.5 - 5.1 mmol/L 4.4 4.2 5.2(H)  Chloride 98 - 111 mmol/L 106 108 108  CO2 22 - 32 mmol/L 24 19(L) 21(L)  Calcium 8.9 - 10.3  mg/dL - 8.9 9.2  Total Protein 6.5 - 8.1 g/dL - 7.4 -  Total Bilirubin 0.3 - 1.2 mg/dL - 0.5 -  Alkaline Phos 38 - 126 U/L - 65 -  AST 15 - 41 U/L - 19 -  ALT 0 - 44 U/L - 12 -    Lab Results  Component Value Date   TSH 1.948 12/06/2018   Lab Results  Component Value Date   ALBUMIN 4.3 04/18/2020   ANIONGAP 11 07/07/2020   Lab Results  Component Value Date   CHOL 115 12/29/2019   CHOL 107 12/15/2019   CHOL 112 11/15/2018   HDL 31 (L) 12/29/2019   HDL 22 (L) 12/15/2019   HDL 27 (L) 11/15/2018   LDLCALC 51 12/29/2019   LDLCALC 33 12/15/2019   LDLCALC 51 11/15/2018   CHOLHDL 3.7 12/29/2019   CHOLHDL 4.9 12/15/2019   CHOLHDL 4.1 11/15/2018   Lab Results  Component Value Date   TRIG 163 (H) 12/29/2019   Lab Results  Component Value Date   HGBA1C 8.7 (H) 12/15/2019   HGBA1C 8.5 (H) 12/14/2019   HGBA1C 7.2 (H) 11/15/2018       ASSESSMENT & PLAN:   Problem List Items Addressed This Visit      Cardiovascular and Mediastinum   Aortic atherosclerosis ([place])   Relevant Medications   sacubitril-valsartan (ENTRESTO) 49-51 mg per tablet (Start on 07/15/2020  3:00 PM)   Heart failure ([place]) - Primary    Pt was found to be wheezing today. He also feels tired and fatigued. He denied a Hx of fever or chills. He has 2+ pedal edema in the right leg and 1+ in the left. He also complained of cough, worse at night, with productive sputnum which is mucus-like. I gave him Zithromax as directed. I also gave him Lasix 20mg  IM. His Potassium was ok. BNP was normal.       Relevant Medications   sacubitril-valsartan (ENTRESTO) 49-51 mg per tablet (Start on 07/15/2020  3:00 PM)     Respiratory   Bronchitis    I gave him Zithromax as directed.       Relevant Medications   azithromycin (ZITHROMAX) 250 MG tablet     Other   SOB (shortness of breath)    He was found to be wheezing in the chest. I increased his entresto to 49 BID.  Palpitations    EKG does not show any acute  changes. 1 PAC was noted.       Relevant Orders   EKG 12-Lead      Meds ordered this encounter  Medications  . azithromycin (ZITHROMAX) 250 MG tablet    Sig: 2 tab daily for 3 days    Dispense:  6 tablet    Refill:  0  . DISCONTD: furosemide (LASIX) tablet 20 mg  . furosemide (LASIX) injection 20 mg  . sacubitril-valsartan (ENTRESTO) 49-51 mg per tablet    Order Specific Question:   ACE-inhibitors have NOT been administered in the past 36-hours.    Answer:   YES (confirmed by ordering provider)    [facility] [place], [place] [address] Phone: [phone] Fax:  [phone]   07/15/2020 EKG Report: Normal Sinus Rhythm. Ok PAC. ST no abnormalities. Incomplete right bundle branch block.     Follow-up: No follow-ups on file.    Dr. Jane Canary [facility] [address] [place], [place] [address]   By signing my name below, I, [employer], attest that this documentation has been prepared under the direction and in the presence of Cletis Athens, MD. Electronically Signed: Cletis Athens, MD 07/15/20, 2:58 PM   I personally performed the services described in this documentation, which was SCRIBED in my presence. The recorded information has been reviewed and considered accurate. It has been edited as necessary during review. Cletis Athens, MD

## 2020-07-15 NOTE — Assessment & Plan Note (Signed)
EKG does not show any acute changes. 1 PAC was noted.

## 2020-07-18 ENCOUNTER — Encounter: Payer: Self-pay | Admitting: Internal Medicine

## 2020-07-18 ENCOUNTER — Ambulatory Visit (INDEPENDENT_AMBULATORY_CARE_PROVIDER_SITE_OTHER): Payer: Medicare Other | Admitting: Internal Medicine

## 2020-07-18 ENCOUNTER — Ambulatory Visit
Admission: RE | Admit: 2020-07-18 | Discharge: 2020-07-18 | Disposition: A | Discharge status: Home / Self Care | Payer: Medicare Other | Source: Ambulatory Visit | Attending: Cardiology | Admitting: Cardiology

## 2020-07-18 ENCOUNTER — Other Ambulatory Visit: Payer: Self-pay

## 2020-07-18 ENCOUNTER — Ambulatory Visit
Admission: RE | Admit: 2020-07-18 | Discharge: 2020-07-18 | Disposition: A | Discharge status: Home / Self Care | Payer: Medicare Other | Attending: Internal Medicine | Admitting: Internal Medicine

## 2020-07-18 VITALS — BP 164/72 | HR 70 | Wt 204.2 lb

## 2020-07-18 DIAGNOSIS — R531 Weakness: Secondary | ICD-10-CM | POA: Diagnosis not present

## 2020-07-18 DIAGNOSIS — J449 Chronic obstructive pulmonary disease, unspecified: Secondary | ICD-10-CM

## 2020-07-18 DIAGNOSIS — I5042 Chronic combined systolic (congestive) and diastolic (congestive) heart failure: Secondary | ICD-10-CM | POA: Diagnosis not present

## 2020-07-18 DIAGNOSIS — I10 Essential (primary) hypertension: Secondary | ICD-10-CM

## 2020-07-18 DIAGNOSIS — R0602 Shortness of breath: Secondary | ICD-10-CM | POA: Diagnosis not present

## 2020-07-18 DIAGNOSIS — R05 Cough: Secondary | ICD-10-CM | POA: Diagnosis not present

## 2020-07-18 DIAGNOSIS — J90 Pleural effusion, not elsewhere classified: Secondary | ICD-10-CM | POA: Diagnosis not present

## 2020-07-18 DIAGNOSIS — I5032 Chronic diastolic (congestive) heart failure: Secondary | ICD-10-CM

## 2020-07-18 DIAGNOSIS — N184 Chronic kidney disease, stage 4 (severe): Secondary | ICD-10-CM

## 2020-07-18 DIAGNOSIS — E1122 Type 2 diabetes mellitus with diabetic chronic kidney disease: Secondary | ICD-10-CM | POA: Diagnosis not present

## 2020-07-18 DIAGNOSIS — E1129 Type 2 diabetes mellitus with other diabetic kidney complication: Secondary | ICD-10-CM

## 2020-07-18 MED ORDER — FUROSEMIDE 10 MG/ML IJ SOLN
20.0000 mg | Freq: Once | INTRAMUSCULAR | Status: AC
  Administered 2020-07-18: 20 mg via INTRAMUSCULAR

## 2020-07-18 NOTE — Assessment & Plan Note (Signed)
Patient shortness of breath is more likely due to diastolic dysfunction of the right side of the heart.

## 2020-07-18 NOTE — Assessment & Plan Note (Signed)
Patient is not wheezing but complains of tiredness and fatigue no chest pain.  Eyes any swelling of the legs or paroxysmal nocturnal dyspnea.

## 2020-07-18 NOTE — Assessment & Plan Note (Signed)
Renal functions arelow  patient is not followed up by nephrologist.

## 2020-07-18 NOTE — Progress Notes (Signed)
Established Patient Office Visit  SUBJECTIVE:  Subjective  Patient ID: Mike Decker, male    DOB: 1940-12-31  Age: 79 y.o. MRN: [id number]  CC:  Chief Complaint  Patient presents with  . Shortness of Breath    Patient states he is not feeling any better since Friday and still feels very weak and short of breath  . Fatigue    HPI Mike Decker is a 79 y.o. male presenting today for an evaluation of his SOB and fatigue.   Mike Decker states that he continues to feel poorly. He took the Zpack as direct. He notes that he coughed up more yellow phlegm this morning. He continues to feel short of breath, even when sitting down.   He lost about 2 lbs since Friday with the Lasix IM injection.    Past Medical History:  Diagnosis Date  . Anemia   . Arthritis   . Atrioventricular canal (AVC)    irregular heart beats  . Barrett esophagus   . 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 Chronic diastolic CHF (congestive heart failure) ([place])   . Colon polyp   . Diabetes mellitus without complication ([place])   . Diverticulosis   . Gout   . Heart disease   . Hemangioma    liver  . Hyperlipidemia   . Hypertension   . Myocardial infarct ([place])   . Ocular hypertension   . Peripheral vascular disease ([place])   . Skin cancer   . 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[place])   . Sleep apnea   . Vitreoretinal degeneration     Past Surgical History:  Procedure Laterality Date  . CATARACT EXTRACTION  2011, 2012  . COLONOSCOPY N/A 12/14/2018   Procedure: COLONOSCOPY;  Surgeon: Virgel Manifold, MD;  Location: [facility] ENDOSCOPY;  Service: Endoscopy;  Laterality: N/A;  . CORONARY ANGIOPLASTY WITH STENT PLACEMENT  2012  . CORONARY STENT INTERVENTION N/A 12/29/2019   Procedure: CORONARY STENT INTERVENTION;  Surgeon: Nelva Bush, MD;  Location: [place] [facility];  Service: Cardiovascular;  Laterality: N/A;  . ESOPHAGOGASTRODUODENOSCOPY N/A 12/14/2018   Procedure: ESOPHAGOGASTRODUODENOSCOPY (EGD);  Surgeon:  Virgel Manifold, MD;  Location: [facility] ENDOSCOPY;  Service: Endoscopy;  Laterality: N/A;  . ESOPHAGOGASTRODUODENOSCOPY (EGD) WITH PROPOFOL N/A 06/01/2016   Procedure: ESOPHAGOGASTRODUODENOSCOPY (EGD) WITH PROPOFOL;  Surgeon: Manya Silvas, MD;  Location: [facility] ENDOSCOPY;  Service: Endoscopy;  Laterality: N/A;  . HERNIA REPAIR     x2  . INTRAOCULAR LENS INSERTION    . LEFT HEART CATH AND CORONARY ANGIOGRAPHY N/A 05/09/2017   Procedure: Left Heart Cath and Coronary Angiography;  Surgeon: Yolonda Kida, MD;  Location: [place] [facility];  Service: Cardiovascular;  Laterality: N/A;  . LEFT HEART CATH AND CORONARY ANGIOGRAPHY N/A 12/08/2018   Procedure: LEFT HEART CATH AND CORONARY ANGIOGRAPHY and possible PCI and stent;  Surgeon: Yolonda Kida, MD;  Location: [place] [facility];  Service: Cardiovascular;  Laterality: N/A;  . LEFT HEART CATH AND CORONARY ANGIOGRAPHY N/A 12/29/2019   Procedure: LEFT HEART CATH AND CORONARY ANGIOGRAPHY;  Surgeon: Nelva Bush, MD;  Location: [place] [facility];  Service: Cardiovascular;  Laterality: N/A;  . NOSE SURGERY     submucous resection  . PROSTATE SURGERY  2002  . ROTATOR CUFF REPAIR Right     Family History  Problem Relation Age of Onset  . Arthritis Mother   . Stroke Maternal Grandfather   . Breast cancer Neg Hx     Social History  Socioeconomic History  . Marital status: Married    Spouse name: Not on file  . Number of children: Not on file  . Years of education: Not on file  . Highest education level: Not on file  Occupational History  . Not on file  Tobacco Use  . Smoking status: Former Smoker    Years: 20.00    Types: Cigarettes    Quit date: 12/10/1976    Years since quitting: 43.6  . Smokeless tobacco: Never Used  Substance and Sexual Activity  . Alcohol use: No  . Drug use: No  . Sexual activity: Not on file  Other Topics Concern  . Not on file  Social History Narrative  . Not on file   Social  Determinants of Health   Financial Resource Strain:   . Difficulty of Paying Living Expenses:   Food Insecurity:   . Worried About Charity fundraiser in the Last Year:   . Arboriculturist in the Last Year:   Transportation Needs:   . Film/video editor (Medical):   Marland Kitchen Lack of Transportation (Non-Medical):   Physical Activity:   . Days of Exercise per Week:   . Minutes of Exercise per Session:   Stress:   . Feeling of Stress :   Social Connections:   . Frequency of Communication with Friends and Family:   . Frequency of Social Gatherings with Friends and Family:   . Attends Religious Services:   . Active Member of Clubs or Organizations:   . Attends Archivist Meetings:   Marland Kitchen Marital Status:   Intimate Partner Violence:   . Fear of Current or Ex-Partner:   . Emotionally Abused:   Marland Kitchen Physically Abused:   . Sexually Abused:      Current Outpatient Medications:  .  acetaminophen (TYLENOL) 500 MG tablet, Take 1,000 mg by mouth every 6 (six) hours as needed for mild pain, moderate pain or headache., Disp: , Rfl:  .  allopurinol (ZYLOPRIM) 100 MG tablet, Take 100 mg by mouth daily. , Disp: , Rfl:  .  amLODipine (NORVASC) 5 MG tablet, Take 5 mg by mouth 2 (two) times daily. , Disp: , Rfl:  .  aspirin EC 81 MG EC tablet, Take 1 tablet (81 mg total) by mouth daily., Disp: 60 tablet, Rfl: 0 .  azelastine (ASTELIN) 0.1 % nasal spray, USE 1 TO 2 SPRAYS IN EACH NOSTRIL TWICE A DAY, Disp: , Rfl:  .  azithromycin (ZITHROMAX) 250 MG tablet, 2 tab daily for 3 days, Disp: 6 tablet, Rfl: 0 .  budesonide-formoterol (SYMBICORT) 160-4.5 MCG/ACT inhaler, Inhale 2 puffs into the lungs 2 (two) times daily as needed. , Disp: , Rfl:  .  cetirizine (ZYRTEC) 5 MG tablet, Take 5 mg by mouth daily as needed for allergies or rhinitis. , Disp: , Rfl:  .  citalopram (CELEXA) 10 MG tablet, Take 10 mg by mouth daily., Disp: , Rfl:  .  clopidogrel (PLAVIX) 75 MG tablet, Take 1 tablet (75 mg total) by  mouth daily., Disp: 30 tablet, Rfl: 11 .  colchicine 0.6 MG tablet, Take 3 tablets (1.8 mg total) by mouth daily as needed (gout flares)., Disp: , Rfl:  .  dapagliflozin propanediol (FARXIGA) 10 MG TABS tablet, Take 1 tablet (10 mg total) by mouth daily before breakfast., Disp: 30 tablet, Rfl: 5 .  dextromethorphan-guaiFENesin (MUCINEX DM) 30-600 MG 12hr tablet, Take 1 tablet by mouth 2 (two) times daily as needed for cough., Disp: ,  Rfl:  .  EPINEPHrine (EPI-PEN) 0.3 mg/0.3 mL SOAJ injection, Inject into the muscle as needed (anaphylaxis). , Disp: , Rfl:  .  ferrous sulfate 325 (65 FE) MG EC tablet, Take 1 tablet (325 mg total) by mouth daily., Disp: 90 tablet, Rfl: 3 .  fluticasone (FLONASE) 50 MCG/ACT nasal spray, Place 2 sprays into both nostrils 2 (two) times daily. , Disp: , Rfl: 3 .  folic acid (V-R FOLIC ACID) 762 MCG tablet, Take 1 tablet (400 mcg total) by mouth daily., Disp: 90 tablet, Rfl: 3 .  furosemide (LASIX) 20 MG tablet, Take 2 tablets (40 mg total) by mouth daily., Disp: , Rfl:  .  hydrALAZINE (APRESOLINE) 25 MG tablet, Take 1 tablet (25 mg total) by mouth every 8 (eight) hours. (Patient taking differently: Take 25 mg by mouth 3 (three) times daily. ), Disp: 60 tablet, Rfl: 0 .  insulin aspart (NOVOLOG) 100 UNIT/ML FlexPen, Take up to 100 units daily in divided doses, as advised., Disp: , Rfl:  .  insulin glargine (LANTUS) 100 UNIT/ML injection, Inject 0.35 mLs (35 Units total) into the skin at bedtime. This is a decrease from your previous 45 units nightly., Disp: 10 mL, Rfl: 11 .  ipratropium-albuterol (DUONEB) 0.5-2.5 (3) MG/3ML SOLN, Take 3 mLs by nebulization every 4 (four) hours as needed., Disp: 360 mL, Rfl: 0 .  isosorbide mononitrate (IMDUR) 60 MG 24 hr tablet, Take 60 mg by mouth daily. , Disp: , Rfl:  .  Magnesium 250 MG TABS, Take 250 mg by mouth daily., Disp: , Rfl:  .  methocarbamol (ROBAXIN) 500 MG tablet, Take 500 mg by mouth 2 (two) times daily. , Disp: , Rfl: 0 .   metoprolol tartrate (LOPRESSOR) 50 MG tablet, Take 25 mg by mouth 3 (three) times daily. , Disp: , Rfl:  .  montelukast (SINGULAIR) 10 MG tablet, TAKE 1 TABLET BY MOUTH EVERYDAY AT BEDTIME, Disp: 90 tablet, Rfl: 1 .  nitroGLYCERIN (NITROSTAT) 0.4 MG SL tablet, Place 0.4 mg under the tongue every 5 (five) minutes x 3 doses as needed for chest pain. , Disp: , Rfl:  .  Omega-3 Fatty Acids (FISH OIL) 1000 MG CAPS, Take 1 capsule by mouth daily. , Disp: , Rfl:  .  pantoprazole (PROTONIX) 40 MG tablet, Take 1 tablet (40 mg total) by mouth 2 (two) times daily before a meal., Disp: , Rfl:  .  rosuvastatin (CRESTOR) 40 MG tablet, Take 40 mg by mouth every evening. , Disp: , Rfl:  .  sodium bicarbonate 650 MG tablet, Take 650 mg by mouth 2 (two) times daily., Disp: , Rfl:  .  vitamin B-12 (CYANOCOBALAMIN) 1000 MCG tablet, Take 1 tablet (1,000 mcg total) by mouth daily., Disp: 90 tablet, Rfl: 3 .  vitamin C (ASCORBIC ACID) 250 MG tablet, Take 250 mg by mouth daily., Disp: , Rfl:  .  Vitamin D, Ergocalciferol, (DRISDOL) 50000 units CAPS capsule, Take 50,000 Units by mouth every 30 (thirty) days. , Disp: , Rfl: 1 .  zinc gluconate 50 MG tablet, Take 50 mg by mouth daily., Disp: , Rfl:   Current Facility-Administered Medications:  .  sacubitril-valsartan (ENTRESTO) 49-51 mg per tablet, 1 tablet, Oral, BID, Cletis Athens, MD   Allergies  Allergen Reactions  . Gabapentin     Other reaction(s): Other (See Comments) Tremors  . Peanut-Containing Drug Products Anaphylaxis  . Penicillins Hives and Rash    Has patient had a PCN reaction causing immediate rash, facial/tongue/throat swelling, SOB or lightheadedness  with hypotension: Yes Has patient had a PCN reaction causing severe rash involving mucus membranes or skin necrosis: No Has patient had a PCN reaction that required hospitalization No Has patient had a PCN reaction occurring within the last 10 years: No If all of the above answers are "NO", then may  proceed with Cephalosporin use.  . Bee Venom Swelling  . Influenza Vaccines Hives  . Inh [Isoniazid] Hives  . Kenalog [Triamcinolone Acetonide] Hives  . Levaquin [Levofloxacin] Other (See Comments)    Tendon, ligament pain.   . Nalfon [Fenoprofen Calcium] Hives  . Naproxen   . Peanut Oil   . Nsaids Rash    Nalfon 600 Nalfon 600    ROS Review of Systems  Constitutional: Positive for fatigue.  HENT: Positive for congestion.   Eyes: Negative.   Respiratory: Positive for shortness of breath.   Cardiovascular: Negative.   Gastrointestinal: Negative.   Endocrine: Negative.   Genitourinary: Negative.   Musculoskeletal: Negative.   Skin: Negative.   Allergic/Immunologic: Negative.   Neurological: Negative.   Hematological: Negative.   Psychiatric/Behavioral: Negative.   All other systems reviewed and are negative.    OBJECTIVE:    Physical Exam Vitals reviewed.  Constitutional:      Appearance: Normal appearance.  HENT:     Mouth/Throat:     Mouth: Mucous membranes are moist.  Eyes:     Pupils: Pupils are equal, round, and reactive to light.  Neck:     Vascular: No carotid bruit.  Cardiovascular:     Rate and Rhythm: Normal rate and regular rhythm.     Pulses: Normal pulses.     Heart sounds: Normal heart sounds.  Pulmonary:     Effort: Pulmonary effort is normal.     Breath sounds: Wheezing present.  Abdominal:     General: Bowel sounds are normal.     Palpations: Abdomen is soft. There is no hepatomegaly, splenomegaly or mass.     Tenderness: There is no abdominal tenderness.     Hernia: No hernia is present.  Musculoskeletal:     Cervical back: Neck supple.     Right lower leg: No edema.     Left lower leg: No edema.  Skin:    Findings: No rash.  Neurological:     Mental Status: He is alert and oriented to person, place, and time.     Motor: No weakness.  Psychiatric:        Mood and Affect: Mood normal.        Behavior: Behavior normal.     BP (!)  164/72   Pulse 70   Wt 204 lb 3.2 oz (92.6 kg)   SpO2 98%   BMI 33.98 kg/m  Wt Readings from Last 3 Encounters:  07/18/20 204 lb 3.2 oz (92.6 kg)  07/15/20 206 lb 1.6 oz (93.5 kg)  07/07/20 (!) 206 lb 8 oz (93.7 kg)    Health Maintenance Due  Topic Date Due  . FOOT EXAM  Never done  . OPHTHALMOLOGY EXAM  Never done  . COVID-19 Vaccine (1) Never done  . TETANUS/TDAP  12/10/2013  . HEMOGLOBIN A1C  06/13/2020  . INFLUENZA VACCINE  07/10/2020    There are no preventive care reminders to display for this patient.  CBC Latest Ref Rng & Units 05/24/2020 04/18/2020 12/30/2019  WBC 4.0 - 10.5 K/uL 9.5 10.6(H) 9.1  Hemoglobin 13.0 - 17.0 g/dL 12.4(L) 12.6(L) 11.1(L)  Hematocrit 39 - 52 % 36.6(L) 38.1(L) 35.3(L)  Platelets  150 - 400 K/uL 229 239 266   CMP Latest Ref Rng & Units 07/07/2020 04/18/2020 01/04/2020  Glucose 70 - 99 mg/dL - 194(H) 85  BUN 8 - 23 mg/dL - 52(H) 44(H)  Creatinine 0.61 - 1.24 mg/dL - 1.91(H) 1.93(H)  Sodium 135 - 145 mmol/L 141 137 138  Potassium 3.5 - 5.1 mmol/L 4.4 4.2 5.2(H)  Chloride 98 - 111 mmol/L 106 108 108  CO2 22 - 32 mmol/L 24 19(L) 21(L)  Calcium 8.9 - 10.3 mg/dL - 8.9 9.2  Total Protein 6.5 - 8.1 g/dL - 7.4 -  Total Bilirubin 0.3 - 1.2 mg/dL - 0.5 -  Alkaline Phos 38 - 126 U/L - 65 -  AST 15 - 41 U/L - 19 -  ALT 0 - 44 U/L - 12 -    Lab Results  Component Value Date   TSH 1.948 12/06/2018   Lab Results  Component Value Date   ALBUMIN 4.3 04/18/2020   ANIONGAP 11 07/07/2020   Lab Results  Component Value Date   CHOL 115 12/29/2019   CHOL 107 12/15/2019   CHOL 112 11/15/2018   HDL 31 (L) 12/29/2019   HDL 22 (L) 12/15/2019   HDL 27 (L) 11/15/2018   LDLCALC 51 12/29/2019   LDLCALC 33 12/15/2019   LDLCALC 51 11/15/2018   CHOLHDL 3.7 12/29/2019   CHOLHDL 4.9 12/15/2019   CHOLHDL 4.1 11/15/2018   Lab Results  Component Value Date   TRIG 163 (H) 12/29/2019   Lab Results  Component Value Date   HGBA1C 8.7 (H) 12/15/2019   HGBA1C  8.5 (H) 12/14/2019   HGBA1C 7.2 (H) 11/15/2018      ASSESSMENT & PLAN:   Problem List Items Addressed This Visit      Cardiovascular and Mediastinum   Chronic diastolic heart failure (HCC) (Chronic)   Essential hypertension    Blood pressure elevated today because of anxiety and tiredness.  He did not have any evidence of upper respiratory infection temperature is not elevated.      Heart failure (HCC)    Complain of tiredness and fatigue no paroxysmal nocturnal dyspnea.  No swelling of the legs.  Last ejection fraction was 60 to 65%.  Chest x-ray shows heart to be enlarged no pleural effusion was noted.        Respiratory   COPD (chronic obstructive pulmonary disease) ([place])    Patient is not wheezing but complains of tiredness and fatigue no chest pain.  Eyes any swelling of the legs or paroxysmal nocturnal dyspnea.        Endocrine   Diabetes ([place])   Type II diabetes mellitus with renal manifestations ([place])    Renal functions arelow  patient is not followed up by nephrologist.        Other   SOB (shortness of breath) - Primary    Patient shortness of breath is more likely due to diastolic dysfunction of the right side of the heart.         Meds ordered this encounter  Medications  . furosemide (LASIX) injection 20 mg     Follow-up: No follow-ups on file.    Dr. Jane Canary [facility] [address] [place], [place] [address]   By signing my name below, I, [employer], attest that this documentation has been prepared under the direction and in the presence of Cletis Athens, MD. Electronically Signed: Cletis Athens, MD 07/18/20, 6:07 PM    I personally performed the services described  in this documentation, which was SCRIBED in my presence. The recorded information has been reviewed and considered accurate. It has been edited as necessary during review. Cletis Athens, MD

## 2020-07-18 NOTE — Assessment & Plan Note (Signed)
Complain of tiredness and fatigue no paroxysmal nocturnal dyspnea.  No swelling of the legs.  Last ejection fraction was 60 to 65%.  Chest x-ray shows heart to be enlarged no pleural effusion was noted.

## 2020-07-18 NOTE — Assessment & Plan Note (Signed)
Blood pressure elevated today because of anxiety and tiredness.  He did not have any evidence of upper respiratory infection temperature is not elevated.

## 2020-07-19 ENCOUNTER — Encounter: Payer: Self-pay | Admitting: Internal Medicine

## 2020-07-19 ENCOUNTER — Ambulatory Visit (INDEPENDENT_AMBULATORY_CARE_PROVIDER_SITE_OTHER): Payer: Medicare Other | Admitting: Internal Medicine

## 2020-07-19 VITALS — BP 142/58 | HR 66 | Temp 98.0°F | Resp 16 | Ht 66.0 in | Wt 205.4 lb

## 2020-07-19 DIAGNOSIS — J449 Chronic obstructive pulmonary disease, unspecified: Secondary | ICD-10-CM

## 2020-07-19 DIAGNOSIS — Z9989 Dependence on other enabling machines and devices: Secondary | ICD-10-CM

## 2020-07-19 DIAGNOSIS — G4733 Obstructive sleep apnea (adult) (pediatric): Secondary | ICD-10-CM | POA: Diagnosis not present

## 2020-07-19 DIAGNOSIS — J4489 Other specified chronic obstructive pulmonary disease: Secondary | ICD-10-CM

## 2020-07-19 DIAGNOSIS — I5022 Chronic systolic (congestive) heart failure: Secondary | ICD-10-CM

## 2020-07-19 DIAGNOSIS — I6523 Occlusion and stenosis of bilateral carotid arteries: Secondary | ICD-10-CM

## 2020-07-19 DIAGNOSIS — N184 Chronic kidney disease, stage 4 (severe): Secondary | ICD-10-CM | POA: Diagnosis not present

## 2020-07-19 DIAGNOSIS — R0602 Shortness of breath: Secondary | ICD-10-CM

## 2020-07-19 DIAGNOSIS — I10 Essential (primary) hypertension: Secondary | ICD-10-CM | POA: Diagnosis not present

## 2020-07-19 NOTE — Progress Notes (Signed)
[facility] [place], [place] [address]  Pulmonary Sleep Medicine   Office Visit Note  Patient Name: Mike Decker DOB: 21-Aug-1941 MRN [id number]  Date of Service: 07/19/2020  Complaints/HPI: PT is here for pulm follow up. He has a history of OSA, CAD, Chf and CKD.  He reports he has been working with closely with cardiology on his CHF.  He has been dealing with this for the last few weeks. He has had some increased sob, and has been coughing up yellow sputum.  He was treated with z pak over the last few days.  He had a CXR yesterday which was good. Pt reports good compliance with CPAP therapy. Cleaning machine by hand, and changing filters and tubing as directed. Denies headaches, sinus issues, palpitations, or hemoptysis.      ROS  General: (-) fever, (-) chills, (-) night sweats, (-) weakness Skin: (-) rashes, (-) itching,. Eyes: (-) visual changes, (-) redness, (-) itching. Nose and Sinuses: (-) nasal stuffiness or itchiness, (-) postnasal drip, (-) nosebleeds, (-) sinus trouble. Mouth and Throat: (-) sore throat, (-) hoarseness. Neck: (-) swollen glands, (-) enlarged thyroid, (-) neck pain. Respiratory: - cough, (-) bloody sputum, - shortness of breath, - wheezing. Cardiovascular: - ankle swelling, (-) chest pain. Lymphatic: (-) lymph node enlargement. Neurologic: (-) numbness, (-) tingling. Psychiatric: (-) anxiety, (-) depression   Current Medication: Outpatient Encounter Medications as of 07/19/2020  Medication Sig Note  . acetaminophen (TYLENOL) 500 MG tablet Take 1,000 mg by mouth every 6 (six) hours as needed for mild pain, moderate pain or headache.   . allopurinol (ZYLOPRIM) 100 MG tablet Take 100 mg by mouth daily.    Marland Kitchen amLODipine (NORVASC) 5 MG tablet Take 5 mg by mouth 2 (two) times daily.    Marland Kitchen aspirin EC 81 MG EC tablet Take 1 tablet (81 mg total) by mouth daily.   Marland Kitchen azelastine (ASTELIN) 0.1 % nasal spray USE 1 TO 2 SPRAYS IN EACH  NOSTRIL TWICE A DAY   . azithromycin (ZITHROMAX) 250 MG tablet 2 tab daily for 3 days   . budesonide-formoterol (SYMBICORT) 160-4.5 MCG/ACT inhaler Inhale 2 puffs into the lungs 2 (two) times daily as needed.    . cetirizine (ZYRTEC) 5 MG tablet Take 5 mg by mouth daily as needed for allergies or rhinitis.    . citalopram (CELEXA) 10 MG tablet Take 10 mg by mouth daily.   . clopidogrel (PLAVIX) 75 MG tablet Take 1 tablet (75 mg total) by mouth daily.   . colchicine 0.6 MG tablet Take 3 tablets (1.8 mg total) by mouth daily as needed (gout flares).   Marland Kitchen dextromethorphan-guaiFENesin (MUCINEX DM) 30-600 MG 12hr tablet Take 1 tablet by mouth 2 (two) times daily as needed for cough.   . EPINEPHrine (EPI-PEN) 0.3 mg/0.3 mL SOAJ injection Inject into the muscle as needed (anaphylaxis).    . ferrous sulfate 325 (65 FE) MG EC tablet Take 1 tablet (325 mg total) by mouth daily.   . fluticasone (FLONASE) 50 MCG/ACT nasal spray Place 2 sprays into both nostrils 2 (two) times daily.    . folic acid (V-R FOLIC ACID) 902 MCG tablet Take 1 tablet (400 mcg total) by mouth daily.   . furosemide (LASIX) 20 MG tablet Take 2 tablets (40 mg total) by mouth daily. (Patient taking differently: Take 60 mg by mouth daily. )   . hydrALAZINE (APRESOLINE) 25 MG tablet Take 1 tablet (25 mg total) by mouth every 8 (  eight) hours. (Patient taking differently: Take 50 mg by mouth 2 (two) times daily. )   . insulin aspart (NOVOLOG) 100 UNIT/ML FlexPen 14 UNITS IN AM, 18 UNITS AT LUNCH, 24 UNITS AT DINNER   . insulin glargine (LANTUS) 100 UNIT/ML injection Inject 0.35 mLs (35 Units total) into the skin at bedtime. This is a decrease from your previous 45 units nightly.   Marland Kitchen ipratropium-albuterol (DUONEB) 0.5-2.5 (3) MG/3ML SOLN Take 3 mLs by nebulization every 4 (four) hours as needed.   . isosorbide mononitrate (IMDUR) 60 MG 24 hr tablet Take 60 mg by mouth daily.    . methocarbamol (ROBAXIN) 500 MG tablet Take 500 mg by mouth 2 (two)  times daily.    . metoprolol tartrate (LOPRESSOR) 50 MG tablet Take 25 mg by mouth 3 (three) times daily.    . montelukast (SINGULAIR) 10 MG tablet TAKE 1 TABLET BY MOUTH EVERYDAY AT BEDTIME   . nitroGLYCERIN (NITROSTAT) 0.4 MG SL tablet Place 0.4 mg under the tongue every 5 (five) minutes x 3 doses as needed for chest pain.    . Omega-3 Fatty Acids (FISH OIL) 1000 MG CAPS Take 1 capsule by mouth daily.    . pantoprazole (PROTONIX) 40 MG tablet Take 1 tablet (40 mg total) by mouth 2 (two) times daily before a meal.   . rosuvastatin (CRESTOR) 40 MG tablet Take 40 mg by mouth every evening.    . sodium bicarbonate 650 MG tablet Take 650 mg by mouth 2 (two) times daily.   . vitamin B-12 (CYANOCOBALAMIN) 1000 MCG tablet Take 1 tablet (1,000 mcg total) by mouth daily.   . vitamin C (ASCORBIC ACID) 250 MG tablet Take 250 mg by mouth daily.   . Vitamin D, Ergocalciferol, (DRISDOL) 50000 units CAPS capsule Take 50,000 Units by mouth every 30 (thirty) days.  12/14/2019: 23rd   . zinc gluconate 50 MG tablet Take 50 mg by mouth daily.   . [DISCONTINUED] dapagliflozin propanediol (FARXIGA) 10 MG TABS tablet Take 1 tablet (10 mg total) by mouth daily before breakfast.   . [DISCONTINUED] Magnesium 250 MG TABS Take 250 mg by mouth daily.    Facility-Administered Encounter Medications as of 07/19/2020  Medication  . sacubitril-valsartan (ENTRESTO) 49-51 mg per tablet    Surgical History: Past Surgical History:  Procedure Laterality Date  . CATARACT EXTRACTION  2011, 2012  . COLONOSCOPY N/A 12/14/2018   Procedure: COLONOSCOPY;  Surgeon: Virgel Manifold, MD;  Location: [facility] ENDOSCOPY;  Service: Endoscopy;  Laterality: N/A;  . CORONARY ANGIOPLASTY WITH STENT PLACEMENT  2012  . CORONARY STENT INTERVENTION N/A 12/29/2019   Procedure: CORONARY STENT INTERVENTION;  Surgeon: Nelva Bush, MD;  Location: [place] CV LAB;  Service: Cardiovascular;  Laterality: N/A;  . ESOPHAGOGASTRODUODENOSCOPY N/A  12/14/2018   Procedure: ESOPHAGOGASTRODUODENOSCOPY (EGD);  Surgeon: Virgel Manifold, MD;  Location: [facility] ENDOSCOPY;  Service: Endoscopy;  Laterality: N/A;  . ESOPHAGOGASTRODUODENOSCOPY (EGD) WITH PROPOFOL N/A 06/01/2016   Procedure: ESOPHAGOGASTRODUODENOSCOPY (EGD) WITH PROPOFOL;  Surgeon: Manya Silvas, MD;  Location: [facility] ENDOSCOPY;  Service: Endoscopy;  Laterality: N/A;  . HERNIA REPAIR     x2  . INTRAOCULAR LENS INSERTION    . LEFT HEART CATH AND CORONARY ANGIOGRAPHY N/A 05/09/2017   Procedure: Left Heart Cath and Coronary Angiography;  Surgeon: Yolonda Kida, MD;  Location: [place] CV LAB;  Service: Cardiovascular;  Laterality: N/A;  . LEFT HEART CATH AND CORONARY ANGIOGRAPHY N/A 12/08/2018   Procedure: LEFT HEART CATH AND CORONARY ANGIOGRAPHY and  possible PCI and stent;  Surgeon: Yolonda Kida, MD;  Location: [place] CV LAB;  Service: Cardiovascular;  Laterality: N/A;  . LEFT HEART CATH AND CORONARY ANGIOGRAPHY N/A 12/29/2019   Procedure: LEFT HEART CATH AND CORONARY ANGIOGRAPHY;  Surgeon: Nelva Bush, MD;  Location: [place] CV LAB;  Service: Cardiovascular;  Laterality: N/A;  . NOSE SURGERY     submucous resection  . PROSTATE SURGERY  2002  . ROTATOR CUFF REPAIR Right     Medical History: Past Medical History:  Diagnosis Date  . Anemia   . Arthritis   . Atrioventricular canal (AVC)    irregular heart beats  . Barrett esophagus   . Cancer ([place]) 2002   prostate, esphageal  . Chronic diastolic CHF (congestive heart failure) ([place])   . Colon polyp   . Diabetes mellitus without complication ([place])   . Diverticulosis   . Gout   . Heart disease   . Hemangioma    liver  . Hyperlipidemia   . Hypertension   . Myocardial infarct ([place])   . Ocular hypertension   . Peripheral vascular disease ([place])   . Skin cancer   . 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[place])   . Sleep apnea   . Vitreoretinal degeneration     Family History: Family History  Problem Relation Age  of Onset  . Arthritis Mother   . Stroke Maternal Grandfather   . Breast cancer Neg Hx     Social History: Social History   Socioeconomic History  . Marital status: Married    Spouse name: Not on file  . Number of children: Not on file  . Years of education: Not on file  . Highest education level: Not on file  Occupational History  . Not on file  Tobacco Use  . Smoking status: Former Smoker    Years: 20.00    Types: Cigarettes    Quit date: 12/10/1976    Years since quitting: 43.6  . Smokeless tobacco: Never Used  Substance and Sexual Activity  . Alcohol use: No  . Drug use: No  . Sexual activity: Not on file  Other Topics Concern  . Not on file  Social History Narrative  . Not on file   Social Determinants of Health   Financial Resource Strain:   . Difficulty of Paying Living Expenses:   Food Insecurity:   . Worried About Charity fundraiser in the Last Year:   . Arboriculturist in the Last Year:   Transportation Needs:   . Film/video editor (Medical):   Marland Kitchen Lack of Transportation (Non-Medical):   Physical Activity:   . Days of Exercise per Week:   . Minutes of Exercise per Session:   Stress:   . Feeling of Stress :   Social Connections:   . Frequency of Communication with Friends and Family:   . Frequency of Social Gatherings with Friends and Family:   . Attends Religious Services:   . Active Member of Clubs or Organizations:   . Attends Archivist Meetings:   Marland Kitchen Marital Status:   Intimate Partner Violence:   . Fear of Current or Ex-Partner:   . Emotionally Abused:   Marland Kitchen Physically Abused:   . Sexually Abused:     Vital Signs: Blood pressure (!) 142/58, pulse 66, temperature 98 F (36.7 C), resp. rate 16, height 5\' 6"  (1.676 m), weight 205 lb 6.4 oz (93.2 kg), SpO2 97 %.  Examination: General Appearance: The patient is well-developed,  well-nourished, and in no distress. Skin: Gross inspection of skin unremarkable. Head: normocephalic, no  gross deformities. Eyes: no gross deformities noted. ENT: ears appear grossly normal no exudates. Neck: Supple. No thyromegaly. No LAD. Respiratory: clear bilaterally. Cardiovascular: Normal S1 and S2 without murmur or rub. Extremities: No cyanosis. pulses are equal. Neurologic: Alert and oriented. No involuntary movements.  LABS: Recent Results (from the past 2160 hour(s))  Vitamin B12     Status: None   Collection Time: 05/24/20 12:42 PM  Result Value Ref Range   Vitamin B-12 236 180 - 914 pg/mL    Comment: (NOTE) This assay is not validated for testing neonatal or myeloproliferative syndrome specimens for Vitamin B12 levels. Performed at [facility], [place] [address], [place], [place] [address]   Ferritin     Status: None   Collection Time: 05/24/20 12:42 PM  Result Value Ref Range   Ferritin 71 24 - 336 ng/mL    Comment: Performed at [facility], [address] [place], [place] [address]  Iron and TIBC     Status: Abnormal   Collection Time: 05/24/20 12:42 PM  Result Value Ref Range   Iron 48 45 - 182 ug/dL   TIBC 293 250 - 450 ug/dL   Saturation Ratios 16 (L) 17.9 - 39.5 %   UIBC 245 ug/dL    Comment: Performed at [facility], [address] [place], [place] [address]  CBC with Differential/Platelet     Status: Abnormal   Collection Time: 05/24/20 12:42 PM  Result Value Ref Range   WBC 9.5 4.0 - 10.5 K/uL   RBC 4.09 (L) 4.22 - 5.81 MIL/uL   Hemoglobin 12.4 (L) 13.0 - 17.0 g/dL   HCT 36.6 (L) 39 - 52 %   MCV 89.5 80.0 - 100.0 fL   MCH 30.3 26.0 - 34.0 pg   MCHC 33.9 30.0 - 36.0 g/dL   RDW 13.8 11.5 - 15.5 %   Platelets 229 150 - 400 K/uL   nRBC 0.0 0.0 - 0.2 %   Neutrophils Relative % 71 %   Neutro Abs 6.9 1.7 - 7.7 K/uL   Lymphocytes Relative 11 %   Lymphs Abs 1.0 0.7 - 4.0 K/uL   Monocytes Relative 9 %   Monocytes Absolute 0.8 0 - 1 K/uL   Eosinophils Relative 7 %   Eosinophils Absolute 0.7 (H) 0 - 0 K/uL   Basophils Relative 1  %   Basophils Absolute 0.1 0 - 0 K/uL   Immature Granulocytes 1 %   Abs Immature Granulocytes 0.05 0.00 - 0.07 K/uL    Comment: Performed at [facility], [address] [place], [place] [address]  B Nat Peptide     Status: Abnormal   Collection Time: 07/07/20  1:07 PM  Result Value Ref Range   B Natriuretic Peptide 122.9 (H) 0.0 - 100.0 pg/mL    Comment: Performed at [facility], [address] [place], [place] [address]  Electrolyte panel     Status: None   Collection Time: 07/07/20  1:07 PM  Result Value Ref Range   Sodium 141 135 - 145 mmol/L   Potassium 4.4 3.5 - 5.1 mmol/L   Chloride 106 98 - 111 mmol/L   CO2 24 22 - 32 mmol/L   Anion gap 11 5 - 15    Comment: Performed at [facility], [address], [place], [place] [address]    Radiology: [facility] Chest 2 View  Result  Date: 07/18/2020 CLINICAL DATA:  Shortness of breath and productive cough with weakness 1 week. EXAM: CHEST - 2 VIEW COMPARISON:  04/18/2020 FINDINGS: Lungs are adequately inflated without airspace consolidation or effusion. Cardiomediastinal silhouette and remainder of the exam is unchanged. IMPRESSION: No active cardiopulmonary disease. Electronically Signed   By: Marin Olp M.D.   On: 07/18/2020 17:06    DG Chest 2 View  Result Date: 07/18/2020 CLINICAL DATA:  Shortness of breath and productive cough with weakness 1 week. EXAM: CHEST - 2 VIEW COMPARISON:  04/18/2020 FINDINGS: Lungs are adequately inflated without airspace consolidation or effusion. Cardiomediastinal silhouette and remainder of the exam is unchanged. IMPRESSION: No active cardiopulmonary disease. Electronically Signed   By: Marin Olp M.D.   On: 07/18/2020 17:06    DG Chest 2 View  Result Date: 07/18/2020 CLINICAL DATA:  Shortness of breath and productive cough with weakness 1 week. EXAM: CHEST - 2 VIEW COMPARISON:  04/18/2020 FINDINGS: Lungs are adequately inflated without airspace consolidation or effusion.  Cardiomediastinal silhouette and remainder of the exam is unchanged. IMPRESSION: No active cardiopulmonary disease. Electronically Signed   By: Marin Olp M.D.   On: 07/18/2020 17:06   VAS [place] ABI WITH/WO TBI  Result Date: 07/01/2020 LOWER EXTREMITY DOPPLER STUDY Indications: Peripheral artery disease. High Risk Factors: Hypertension, diabetes.  Vascular Interventions: 2015 stent placement right lower extremity. Comparison Study: 05/05/2019 Performing Technologist: Almira Coaster RVS  Examination Guidelines: A complete evaluation includes at minimum, Doppler waveform signals and systolic blood pressure reading at the level of bilateral brachial, anterior tibial, and posterior tibial arteries, when vessel segments are accessible. Bilateral testing is considered an integral part of a complete examination. Photoelectric Plethysmograph (PPG) waveforms and toe systolic pressure readings are included as required and additional duplex testing as needed. Limited examinations for reoccurring indications may be performed as noted.  ABI Findings: +---------+------------------+-----+--------+--------+ Right    Rt Pressure (mmHg)IndexWaveformComment  +---------+------------------+-----+--------+--------+ Brachial 155                                     +---------+------------------+-----+--------+--------+ ATA      154               0.99                  +---------+------------------+-----+--------+--------+ PTA      156               1.01                  +---------+------------------+-----+--------+--------+ Great Toe134               0.86                  +---------+------------------+-----+--------+--------+ +---------+------------------+-----+--------+-------+ Left     Lt Pressure (mmHg)IndexWaveformComment +---------+------------------+-----+--------+-------+ ATA      128               0.83                 +---------+------------------+-----+--------+-------+ PTA      150                0.97                 +---------+------------------+-----+--------+-------+ Great Toe96                0.62                 +---------+------------------+-----+--------+-------+ +-------+-----------+-----------+------------+------------+  ABI/TBIToday's ABIToday's TBIPrevious ABIPrevious TBI +-------+-----------+-----------+------------+------------+ Right  1.01       .86        1.03        .84          +-------+-----------+-----------+------------+------------+ Left   .97        .62        .99         .76          +-------+-----------+-----------+------------+------------+ Bilateral ABIs appear essentially unchanged compared to prior study on 05/05/2019. Left TBIs appear decreased compared to prior study on 05/05/2019.  Summary: Right: Resting right ankle-brachial index is within normal range. No evidence of significant right lower extremity arterial disease. The right toe-brachial index is normal. Left: Resting left ankle-brachial index is within normal range. No evidence of significant left lower extremity arterial disease. The left toe-brachial index is abnormal.  *See table(s) above for measurements and observations.  Electronically signed by Leotis Pain MD on 07/01/2020 at 8:33:24 AM.    Final    VAS US CAROTID  Result Date: 07/01/2020 Carotid Arterial Duplex Study Risk Factors: Diabetes, coronary artery disease. Performing Technologist: Almira Coaster RVS  Examination Guidelines: A complete evaluation includes B-mode imaging, spectral Doppler, color Doppler, and power Doppler as needed of all accessible portions of each vessel. Bilateral testing is considered an integral part of a complete examination. Limited examinations for reoccurring indications may be performed as noted.  Right Carotid Findings: +----------+--------+--------+--------+------------------+--------+           PSV cm/sEDV cm/sStenosisPlaque DescriptionComments  +----------+--------+--------+--------+------------------+--------+ CCA Prox  85      11                                         +----------+--------+--------+--------+------------------+--------+ CCA Mid   85      12                                         +----------+--------+--------+--------+------------------+--------+ CCA Distal89      14                                         +----------+--------+--------+--------+------------------+--------+ ICA Prox  224     44      60-79%  calcific          tortuous +----------+--------+--------+--------+------------------+--------+ ICA Mid   184     31                                         +----------+--------+--------+--------+------------------+--------+ ICA Distal76      0                                          +----------+--------+--------+--------+------------------+--------+ ECA       240             >50%                               +----------+--------+--------+--------+------------------+--------+ +----------+--------+-------+----------------+-------------------+  PSV cm/sEDV cmsDescribe        Arm Pressure (mmHG) +----------+--------+-------+----------------+-------------------+ [id number]            Multiphasic, WNL                    +----------+--------+-------+----------------+-------------------+ +---------+--------+--+--------+---------+ VertebralPSV cm/s57EDV cm/sAntegrade +---------+--------+--+--------+---------+ ICA/CCA ratio = 2.63 Left Carotid Findings: +----------+--------+--------+--------+------------------+--------+           PSV cm/sEDV cm/sStenosisPlaque DescriptionComments +----------+--------+--------+--------+------------------+--------+ CCA Prox  84      10                                         +----------+--------+--------+--------+------------------+--------+ CCA Mid   91      10                                          +----------+--------+--------+--------+------------------+--------+ CCA Distal79      12                                         +----------+--------+--------+--------+------------------+--------+ ICA Prox  68      17      1-39%   calcific                   +----------+--------+--------+--------+------------------+--------+ ICA Mid   72      16                                         +----------+--------+--------+--------+------------------+--------+ ICA Distal66      16                                         +----------+--------+--------+--------+------------------+--------+ ECA       158             >50%                               +----------+--------+--------+--------+------------------+--------+ +----------+--------+--------+----------------+-------------------+           PSV cm/sEDV cm/sDescribe        Arm Pressure (mmHG) +----------+--------+--------+----------------+-------------------+ Subclavian118             Multiphasic, WNL                    +----------+--------+--------+----------------+-------------------+ +---------+--------+--+--------+---------+ VertebralPSV cm/s59EDV cm/sAntegrade +---------+--------+--+--------+---------+ ICA/CCA ratio = .79  Summary: Right Carotid: Velocities in the right ICA are consistent with a 60-79%                stenosis. Non-hemodynamically significant plaque <50% noted in                the CCA. The ECA appears >50% stenosed. Left Carotid: Velocities in the left ICA are consistent with a 1-39% stenosis.               Non-hemodynamically significant plaque <50% noted in the CCA. The               ECA appears <50%  stenosed. Vertebrals:  Bilateral vertebral arteries demonstrate antegrade flow. Subclavians: Normal flow hemodynamics were seen in bilateral subclavian              arteries. *See table(s) above for measurements and observations.  Electronically signed by Leotis Pain MD on 07/01/2020 at 8:33:26 AM.    Final        Assessment and Plan: Patient Active Problem List   Diagnosis Date Noted  . Bronchitis 07/15/2020  . Palpitations 07/15/2020  . Aortic atherosclerosis ([place]) 06/21/2020  . Heart failure ([place]) 06/21/2020  . Otalgia of both ears 05/27/2020  . Lobar pneumonia ([place])   . 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 Elevated troponin   . Acidosis 10/30/2019  . Benign 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 10/30/2019  . Hyperkalemia 10/30/2019  . 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 Encounter for fitting and adjustment of hearing aid 06/16/2019  . Health maintenance alteration 06/16/2019  . Prostate cancer ([place]) 06/16/2019  . DM type 2 with diabetic peripheral neuropathy ([place]) 01/25/2019  . CKD (chronic kidney disease) stage 4, GFR 15-29 ml/min (HCC) 01/21/2019  . Gout 01/21/2019  . MR (mitral regurgitation) 01/21/2019  . COPD (chronic obstructive pulmonary disease) ([place]) 01/08/2019  . OSA (obstructive sleep apnea) 01/08/2019  . Chronic diastolic heart failure ([place]) 12/30/2018  . Polyp of sigmoid colon   . Benign neoplasm of ascending colon   . Hemorrhoids without complication   . Diverticulosis of large intestine without diverticulitis   . Reflux esophagitis   . Lymphangiectasia   . Arteriovenous malformation of duodenum   . Symptomatic anemia   . SOB (shortness of breath)   . Iron deficiency anemia due to chronic blood loss   . Unstable angina ([place])   . Anemia of chronic renal failure, stage 4 (severe) ([place])   . Chest pain 11/14/2018  . Sciatica 11/12/2018  . CAD (coronary artery disease) 02/27/2018  . Sacroiliac joint pain 10/08/2017  . Pain in limb 09/24/2017  . Neuropathy 07/24/2017  . NSTEMI (non-ST elevated myocardial infarction) ([place]) 05/06/2017  . PAD (peripheral artery disease) ([place]) 05/01/2017  . History of stroke 01/02/2017  . Barrett's  esophagus 10/02/2016  . Carotid stenosis 10/02/2016  . Essential hypertension 10/02/2016  . Hyperlipidemia 10/02/2016  . Diabetes ([place]) 10/02/2016  . 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 Uncontrolled type 2 diabetes mellitus with hyperglycemia, with long-term current use of insulin ([place]) 04/10/2016  . TIA (transient ischemic attack) 10/08/2015    1. OSA on CPAP Stable, continue to use cpap machine while sleeping. Pts CPAP machine is 80 years old, and is begining to make noise.   2. Obstructive chronic bronchitis without exacerbation ([place]) Stable continue to use inhalers.   3. Chronic systolic congestive heart failure ([place]) Continue to follow up with cardiology as discussed.   4. Essential hypertension managed at this time, continue current therapy   5. Chronic renal failure syndrome, stage 4 (severe) (HCC) Continue to follow up with Nephrology.   6. SOB (shortness of breath) - Spirometry with Graph  General Counseling: I have discussed the findings of the evaluation and examination with Marland Kitchen.  I have also discussed any further diagnostic evaluation thatmay be needed or ordered today. Domenik verbalizes understanding of the findings of todays visit. We also reviewed his medications today and discussed drug interactions and side effects including but not limited excessive drowsiness and altered  mental states. We also discussed that there is always a risk not just to him but also people around him. he has been encouraged to call the office with any questions or concerns that should arise related to todays visit.  Orders Placed This Encounter  Procedures  . Spirometry with Graph    Order Specific Question:   Where should this test be performed?    Answer:   [place]     Time spent: 25 This patient was seen by Orson Gear AGNP-C in Collaboration with Dr. Devona Konig as a part of collaborative care agreement.   I have personally  obtained a history, examined the patient, evaluated laboratory and imaging results, formulated the assessment and plan and placed orders.    Allyne Gee, MD [facility] Critical [facility] Sleep medicine

## 2020-07-20 ENCOUNTER — Telehealth: Payer: Self-pay

## 2020-07-20 DIAGNOSIS — I340 Nonrheumatic mitral (valve) insufficiency: Secondary | ICD-10-CM | POA: Diagnosis not present

## 2020-07-20 DIAGNOSIS — G4733 Obstructive sleep apnea (adult) (pediatric): Secondary | ICD-10-CM | POA: Diagnosis not present

## 2020-07-20 DIAGNOSIS — Z20822 Contact with and (suspected) exposure to covid-19: Secondary | ICD-10-CM | POA: Diagnosis not present

## 2020-07-20 DIAGNOSIS — J449 Chronic obstructive pulmonary disease, unspecified: Secondary | ICD-10-CM | POA: Diagnosis not present

## 2020-07-20 DIAGNOSIS — R0602 Shortness of breath: Secondary | ICD-10-CM | POA: Diagnosis not present

## 2020-07-20 DIAGNOSIS — I6529 Occlusion and stenosis of unspecified carotid artery: Secondary | ICD-10-CM | POA: Diagnosis not present

## 2020-07-20 DIAGNOSIS — I214 Non-ST elevation (NSTEMI) myocardial infarction: Secondary | ICD-10-CM | POA: Diagnosis not present

## 2020-07-20 DIAGNOSIS — E782 Mixed hyperlipidemia: Secondary | ICD-10-CM | POA: Diagnosis not present

## 2020-07-20 DIAGNOSIS — I10 Essential (primary) hypertension: Secondary | ICD-10-CM | POA: Diagnosis not present

## 2020-07-20 DIAGNOSIS — Z79899 Other long term (current) drug therapy: Secondary | ICD-10-CM | POA: Diagnosis not present

## 2020-07-20 DIAGNOSIS — I2510 Atherosclerotic heart disease of native coronary artery without angina pectoris: Secondary | ICD-10-CM | POA: Diagnosis not present

## 2020-07-20 DIAGNOSIS — I739 Peripheral vascular disease, unspecified: Secondary | ICD-10-CM | POA: Diagnosis not present

## 2020-07-20 NOTE — Telephone Encounter (Signed)
Gave new cpap order for new cpap to [facility]. Beth

## 2020-07-28 DIAGNOSIS — E1142 Type 2 diabetes mellitus with diabetic polyneuropathy: Secondary | ICD-10-CM | POA: Diagnosis not present

## 2020-08-05 DIAGNOSIS — E1159 Type 2 diabetes mellitus with other circulatory complications: Secondary | ICD-10-CM | POA: Diagnosis not present

## 2020-08-05 DIAGNOSIS — E1142 Type 2 diabetes mellitus with diabetic polyneuropathy: Secondary | ICD-10-CM | POA: Diagnosis not present

## 2020-08-05 DIAGNOSIS — R809 Proteinuria, unspecified: Secondary | ICD-10-CM | POA: Diagnosis not present

## 2020-08-05 DIAGNOSIS — Z794 Long term (current) use of insulin: Secondary | ICD-10-CM | POA: Diagnosis not present

## 2020-08-05 DIAGNOSIS — E1122 Type 2 diabetes mellitus with diabetic chronic kidney disease: Secondary | ICD-10-CM | POA: Diagnosis not present

## 2020-08-05 DIAGNOSIS — E1129 Type 2 diabetes mellitus with other diabetic kidney complication: Secondary | ICD-10-CM | POA: Diagnosis not present

## 2020-08-05 DIAGNOSIS — N1832 Chronic kidney disease, stage 3b: Secondary | ICD-10-CM | POA: Diagnosis not present

## 2020-08-25 DIAGNOSIS — R0602 Shortness of breath: Secondary | ICD-10-CM | POA: Diagnosis not present

## 2020-08-31 DIAGNOSIS — E119 Type 2 diabetes mellitus without complications: Secondary | ICD-10-CM | POA: Diagnosis not present

## 2020-08-31 DIAGNOSIS — I10 Essential (primary) hypertension: Secondary | ICD-10-CM | POA: Diagnosis not present

## 2020-08-31 DIAGNOSIS — E782 Mixed hyperlipidemia: Secondary | ICD-10-CM | POA: Diagnosis not present

## 2020-08-31 DIAGNOSIS — J449 Chronic obstructive pulmonary disease, unspecified: Secondary | ICD-10-CM | POA: Diagnosis not present

## 2020-08-31 DIAGNOSIS — Z01818 Encounter for other preprocedural examination: Secondary | ICD-10-CM | POA: Diagnosis not present

## 2020-08-31 DIAGNOSIS — I2510 Atherosclerotic heart disease of native coronary artery without angina pectoris: Secondary | ICD-10-CM | POA: Diagnosis not present

## 2020-08-31 DIAGNOSIS — G4733 Obstructive sleep apnea (adult) (pediatric): Secondary | ICD-10-CM | POA: Diagnosis not present

## 2020-08-31 DIAGNOSIS — I739 Peripheral vascular disease, unspecified: Secondary | ICD-10-CM | POA: Diagnosis not present

## 2020-08-31 DIAGNOSIS — I214 Non-ST elevation (NSTEMI) myocardial infarction: Secondary | ICD-10-CM | POA: Diagnosis not present

## 2020-08-31 DIAGNOSIS — I6529 Occlusion and stenosis of unspecified carotid artery: Secondary | ICD-10-CM | POA: Diagnosis not present

## 2020-08-31 DIAGNOSIS — I340 Nonrheumatic mitral (valve) insufficiency: Secondary | ICD-10-CM | POA: Diagnosis not present

## 2020-08-31 DIAGNOSIS — R0602 Shortness of breath: Secondary | ICD-10-CM | POA: Diagnosis not present

## 2020-09-02 ENCOUNTER — Other Ambulatory Visit: Payer: Self-pay | Admitting: Adult Health

## 2020-09-09 DIAGNOSIS — N2581 Secondary hyperparathyroidism of renal origin: Secondary | ICD-10-CM | POA: Diagnosis not present

## 2020-09-09 DIAGNOSIS — E872 Acidosis: Secondary | ICD-10-CM | POA: Diagnosis not present

## 2020-09-09 DIAGNOSIS — I129 Hypertensive chronic kidney disease with stage 1 through stage 4 chronic kidney disease, or unspecified chronic kidney disease: Secondary | ICD-10-CM | POA: Diagnosis not present

## 2020-09-09 DIAGNOSIS — D631 Anemia in chronic kidney disease: Secondary | ICD-10-CM | POA: Diagnosis not present

## 2020-09-09 DIAGNOSIS — E1122 Type 2 diabetes mellitus with diabetic chronic kidney disease: Secondary | ICD-10-CM | POA: Diagnosis not present

## 2020-09-09 DIAGNOSIS — E875 Hyperkalemia: Secondary | ICD-10-CM | POA: Diagnosis not present

## 2020-09-09 DIAGNOSIS — N184 Chronic kidney disease, stage 4 (severe): Secondary | ICD-10-CM | POA: Diagnosis not present

## 2020-09-10 ENCOUNTER — Other Ambulatory Visit: Payer: Self-pay | Admitting: Internal Medicine

## 2020-09-13 ENCOUNTER — Ambulatory Visit (INDEPENDENT_AMBULATORY_CARE_PROVIDER_SITE_OTHER): Payer: Medicare Other | Admitting: Internal Medicine

## 2020-09-13 ENCOUNTER — Other Ambulatory Visit: Payer: Self-pay

## 2020-09-13 ENCOUNTER — Encounter: Payer: Self-pay | Admitting: Internal Medicine

## 2020-09-13 VITALS — BP 158/51 | HR 76 | Ht 65.0 in | Wt 204.0 lb

## 2020-09-13 DIAGNOSIS — I10 Essential (primary) hypertension: Secondary | ICD-10-CM | POA: Diagnosis not present

## 2020-09-13 DIAGNOSIS — I6523 Occlusion and stenosis of bilateral carotid arteries: Secondary | ICD-10-CM

## 2020-09-13 DIAGNOSIS — R0602 Shortness of breath: Secondary | ICD-10-CM | POA: Diagnosis not present

## 2020-09-13 DIAGNOSIS — N184 Chronic kidney disease, stage 4 (severe): Secondary | ICD-10-CM

## 2020-09-13 DIAGNOSIS — E1122 Type 2 diabetes mellitus with diabetic chronic kidney disease: Secondary | ICD-10-CM

## 2020-09-13 DIAGNOSIS — R059 Cough, unspecified: Secondary | ICD-10-CM | POA: Insufficient documentation

## 2020-09-13 DIAGNOSIS — E785 Hyperlipidemia, unspecified: Secondary | ICD-10-CM | POA: Diagnosis not present

## 2020-09-13 LAB — POC COVID19 BINAXNOW: SARS Coronavirus 2 Ag: NEGATIVE

## 2020-09-13 NOTE — Assessment & Plan Note (Signed)
Patient is being followed up by nephrologist.

## 2020-09-13 NOTE — Progress Notes (Signed)
Established Patient Office Visit  SUBJECTIVE:  Subjective  Patient ID: Mike Decker, male    DOB: 1941-06-17  Age: 79 y.o. MRN: [id number]  CC:  Chief Complaint  Patient presents with  . Cough    patient complains of coughing up a lot of phlegm     HPI Mike Decker is a 79 y.o. male presenting today for an evaluation of a new cough.   Ref Range & Units 14:41  SARS Coronavirus 2 Ag Negative Negative     Cough This is a new problem. The current episode started in the past 7 days. The problem has been gradually worsening. The cough is productive of brown sputum. Associated symptoms include chest pain (with cough) and postnasal drip. Pertinent negatives include no chills, ear congestion or fever. The symptoms are aggravated by exercise, lying down and pollens. Risk factors for lung disease include smoking/tobacco exposure. He has tried OTC cough suppressant for the symptoms. His past medical history is significant for pneumonia. sleep apnea     Past Medical History:  Diagnosis Date  . Anemia   . Arthritis   . Atrioventricular canal (AVC)    irregular heart beats  . Barrett esophagus   . 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 Chronic diastolic CHF (congestive heart failure) ([place])   . Colon polyp   . Diabetes mellitus without complication ([place])   . Diverticulosis   . Gout   . Heart disease   . Hemangioma    liver  . Hyperlipidemia   . Hypertension   . Myocardial infarct ([place])   . Ocular hypertension   . Peripheral vascular disease ([place])   . Skin cancer   . 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[place])   . Sleep apnea   . Vitreoretinal degeneration     Past Surgical History:  Procedure Laterality Date  . CATARACT EXTRACTION  2011, 2012  . COLONOSCOPY N/A 12/14/2018   Procedure: COLONOSCOPY;  Surgeon: Virgel Manifold, MD;  Location: [facility] ENDOSCOPY;  Service: Endoscopy;  Laterality: N/A;  . CORONARY ANGIOPLASTY WITH STENT PLACEMENT  2012  . CORONARY STENT INTERVENTION  N/A 12/29/2019   Procedure: CORONARY STENT INTERVENTION;  Surgeon: Nelva Bush, MD;  Location: [place] [facility] LAB;  Service: Cardiovascular;  Laterality: N/A;  . ESOPHAGOGASTRODUODENOSCOPY N/A 12/14/2018   Procedure: ESOPHAGOGASTRODUODENOSCOPY (EGD);  Surgeon: Virgel Manifold, MD;  Location: [facility] ENDOSCOPY;  Service: Endoscopy;  Laterality: N/A;  . ESOPHAGOGASTRODUODENOSCOPY (EGD) WITH PROPOFOL N/A 06/01/2016   Procedure: ESOPHAGOGASTRODUODENOSCOPY (EGD) WITH PROPOFOL;  Surgeon: Manya Silvas, MD;  Location: [facility] ENDOSCOPY;  Service: Endoscopy;  Laterality: N/A;  . HERNIA REPAIR     x2  . INTRAOCULAR LENS INSERTION    . LEFT HEART CATH AND CORONARY ANGIOGRAPHY N/A 05/09/2017   Procedure: Left Heart Cath and Coronary Angiography;  Surgeon: Yolonda Kida, MD;  Location: [place] [facility];  Service: Cardiovascular;  Laterality: N/A;  . LEFT HEART CATH AND CORONARY ANGIOGRAPHY N/A 12/08/2018   Procedure: LEFT HEART CATH AND CORONARY ANGIOGRAPHY and possible PCI and stent;  Surgeon: Yolonda Kida, MD;  Location: [place] [facility];  Service: Cardiovascular;  Laterality: N/A;  . LEFT HEART CATH AND CORONARY ANGIOGRAPHY N/A 12/29/2019   Procedure: LEFT HEART CATH AND CORONARY ANGIOGRAPHY;  Surgeon: Nelva Bush, MD;  Location: [place] [facility] LAB;  Service: Cardiovascular;  Laterality: N/A;  . NOSE SURGERY     submucous resection  . PROSTATE SURGERY  2002  . ROTATOR CUFF REPAIR Right  Family History  Problem Relation Age of Onset  . Arthritis Mother   . Stroke Maternal Grandfather   . Breast cancer Neg Hx     Social History   Socioeconomic History  . Marital status: Married    Spouse name: Not on file  . Number of children: Not on file  . Years of education: Not on file  . Highest education level: Not on file  Occupational History  . Not on file  Tobacco Use  . Smoking status: Former Smoker    Years: 20.00    Types: Cigarettes    Quit date: 12/10/1976      Years since quitting: 43.7  . Smokeless tobacco: Never Used  Substance and Sexual Activity  . Alcohol use: No  . Drug use: No  . Sexual activity: Not on file  Other Topics Concern  . Not on file  Social History Narrative  . Not on file   Social Determinants of Health   Financial Resource Strain:   . Difficulty of Paying Living Expenses: Not on file  Food Insecurity:   . Worried About Charity fundraiser in the Last Year: Not on file  . Ran Out of Food in the Last Year: Not on file  Transportation Needs:   . Lack of Transportation (Medical): Not on file  . Lack of Transportation (Non-Medical): Not on file  Physical Activity:   . Days of Exercise per Week: Not on file  . Minutes of Exercise per Session: Not on file  Stress:   . Feeling of Stress : Not on file  Social Connections:   . Frequency of Communication with Friends and Family: Not on file  . Frequency of Social Gatherings with Friends and Family: Not on file  . Attends Religious Services: Not on file  . Active Member of Clubs or Organizations: Not on file  . Attends Archivist Meetings: Not on file  . Marital Status: Not on file  Intimate Partner Violence:   . Fear of Current or Ex-Partner: Not on file  . Emotionally Abused: Not on file  . Physically Abused: Not on file  . Sexually Abused: Not on file     Current Outpatient Medications:  .  acetaminophen (TYLENOL) 500 MG tablet, Take 1,000 mg by mouth every 6 (six) hours as needed for mild pain, moderate pain or headache., Disp: , Rfl:  .  allopurinol (ZYLOPRIM) 100 MG tablet, Take 100 mg by mouth daily. , Disp: , Rfl:  .  amLODipine (NORVASC) 5 MG tablet, Take 5 mg by mouth 2 (two) times daily. , Disp: , Rfl:  .  aspirin EC 81 MG EC tablet, Take 1 tablet (81 mg total) by mouth daily., Disp: 60 tablet, Rfl: 0 .  azelastine (ASTELIN) 0.1 % nasal spray, USE 1 TO 2 SPRAYS IN EACH NOSTRIL TWICE A DAY, Disp: , Rfl:  .  azithromycin (ZITHROMAX) 250 MG  tablet, 2 tab daily for 3 days, Disp: 6 tablet, Rfl: 0 .  budesonide-formoterol (SYMBICORT) 160-4.5 MCG/ACT inhaler, Inhale 2 puffs into the lungs 2 (two) times daily as needed. , Disp: , Rfl:  .  cetirizine (ZYRTEC) 5 MG tablet, Take 5 mg by mouth daily as needed for allergies or rhinitis. , Disp: , Rfl:  .  citalopram (CELEXA) 10 MG tablet, Take 10 mg by mouth daily., Disp: , Rfl:  .  clopidogrel (PLAVIX) 75 MG tablet, Take 1 tablet (75 mg total) by mouth daily., Disp: 30 tablet, Rfl: 11 .  colchicine 0.6 MG tablet, Take 3 tablets (1.8 mg total) by mouth daily as needed (gout flares)., Disp: , Rfl:  .  dextromethorphan-guaiFENesin (MUCINEX DM) 30-600 MG 12hr tablet, Take 1 tablet by mouth 2 (two) times daily as needed for cough., Disp: , Rfl:  .  EPINEPHrine (EPI-PEN) 0.3 mg/0.3 mL SOAJ injection, Inject into the muscle as needed (anaphylaxis). , Disp: , Rfl:  .  ferrous sulfate 325 (65 FE) MG EC tablet, Take 1 tablet (325 mg total) by mouth daily., Disp: 90 tablet, Rfl: 3 .  fluticasone (FLONASE) 50 MCG/ACT nasal spray, USE 1 SPRAY IN EACH NOSTRIL EVERY DAY, Disp: 32 g, Rfl: 6 .  folic acid (V-R FOLIC ACID) 035 MCG tablet, Take 1 tablet (400 mcg total) by mouth daily., Disp: 90 tablet, Rfl: 3 .  furosemide (LASIX) 20 MG tablet, Take 2 tablets (40 mg total) by mouth daily. (Patient taking differently: Take 60 mg by mouth daily. ), Disp: , Rfl:  .  hydrALAZINE (APRESOLINE) 25 MG tablet, Take 1 tablet (25 mg total) by mouth every 8 (eight) hours. (Patient taking differently: Take 50 mg by mouth 2 (two) times daily. ), Disp: 60 tablet, Rfl: 0 .  insulin aspart (NOVOLOG) 100 UNIT/ML FlexPen, 14 UNITS IN AM, 18 UNITS AT LUNCH, 24 UNITS AT DINNER, Disp: , Rfl:  .  insulin glargine (LANTUS) 100 UNIT/ML injection, Inject 0.35 mLs (35 Units total) into the skin at bedtime. This is a decrease from your previous 45 units nightly., Disp: 10 mL, Rfl: 11 .  ipratropium-albuterol (DUONEB) 0.5-2.5 (3) MG/3ML SOLN,  Take 3 mLs by nebulization every 4 (four) hours as needed., Disp: 360 mL, Rfl: 0 .  isosorbide mononitrate (IMDUR) 60 MG 24 hr tablet, Take 60 mg by mouth daily. , Disp: , Rfl:  .  methocarbamol (ROBAXIN) 500 MG tablet, Take 500 mg by mouth 2 (two) times daily. , Disp: , Rfl: 0 .  metoprolol tartrate (LOPRESSOR) 50 MG tablet, Take 25 mg by mouth 3 (three) times daily. , Disp: , Rfl:  .  montelukast (SINGULAIR) 10 MG tablet, TAKE 1 TABLET BY MOUTH EVERYDAY AT BEDTIME, Disp: 90 tablet, Rfl: 1 .  nitroGLYCERIN (NITROSTAT) 0.4 MG SL tablet, Place 0.4 mg under the tongue every 5 (five) minutes x 3 doses as needed for chest pain. , Disp: , Rfl:  .  Omega-3 Fatty Acids (FISH OIL) 1000 MG CAPS, Take 1 capsule by mouth daily. , Disp: , Rfl:  .  pantoprazole (PROTONIX) 40 MG tablet, Take 1 tablet (40 mg total) by mouth 2 (two) times daily before a meal., Disp: , Rfl:  .  rosuvastatin (CRESTOR) 40 MG tablet, Take 40 mg by mouth every evening. , Disp: , Rfl:  .  sodium bicarbonate 650 MG tablet, Take 650 mg by mouth 2 (two) times daily., Disp: , Rfl:  .  vitamin B-12 (CYANOCOBALAMIN) 1000 MCG tablet, Take 1 tablet (1,000 mcg total) by mouth daily., Disp: 90 tablet, Rfl: 3 .  vitamin C (ASCORBIC ACID) 250 MG tablet, Take 250 mg by mouth daily., Disp: , Rfl:  .  Vitamin D, Ergocalciferol, (DRISDOL) 50000 units CAPS capsule, Take 50,000 Units by mouth every 30 (thirty) days. , Disp: , Rfl: 1 .  zinc gluconate 50 MG tablet, Take 50 mg by mouth daily., Disp: , Rfl:    Allergies  Allergen Reactions  . Gabapentin     Other reaction(s): Other (See Comments) Tremors  . Peanut-Containing Drug Products Anaphylaxis  . Penicillins Hives and  Rash    Has patient had a PCN reaction causing immediate rash, facial/tongue/throat swelling, SOB or lightheadedness with hypotension: Yes Has patient had a PCN reaction causing severe rash involving mucus membranes or skin necrosis: No Has patient had a PCN reaction that required  hospitalization No Has patient had a PCN reaction occurring within the last 10 years: No If all of the above answers are "NO", then may proceed with Cephalosporin use.  . Bee Venom Swelling  . Influenza Vaccines Hives  . Inh [Isoniazid] Hives  . Kenalog [Triamcinolone Acetonide] Hives  . Levaquin [Levofloxacin] Other (See Comments)    Tendon, ligament pain.   . Nalfon [Fenoprofen Calcium] Hives  . Naproxen   . Peanut Oil   . Nsaids Rash    Nalfon 600 Nalfon 600    ROS Review of Systems  Constitutional: Negative.  Negative for chills and fever.  HENT: Positive for postnasal drip.   Eyes: Negative.   Respiratory: Positive for cough.   Cardiovascular: Positive for chest pain (with cough).  Gastrointestinal: Negative.   Endocrine: Negative.   Genitourinary: Negative.   Musculoskeletal: Negative.   Skin: Negative.   Allergic/Immunologic: Negative.   Neurological: Negative.   Hematological: Negative.   Psychiatric/Behavioral: Negative.   All other systems reviewed and are negative.    OBJECTIVE:    Physical Exam Vitals reviewed.  Constitutional:      Appearance: Normal appearance.  HENT:     Mouth/Throat:     Mouth: Mucous membranes are moist.  Eyes:     Pupils: Pupils are equal, round, and reactive to light.  Neck:     Vascular: No carotid bruit.  Cardiovascular:     Rate and Rhythm: Normal rate and regular rhythm.     Pulses: Normal pulses.     Heart sounds: Normal heart sounds.  Pulmonary:     Effort: Pulmonary effort is normal.     Breath sounds: Rhonchi present. No rales.  Abdominal:     General: Bowel sounds are normal.     Palpations: Abdomen is soft. There is no hepatomegaly, splenomegaly or mass.     Tenderness: There is no abdominal tenderness.     Hernia: No hernia is present.  Musculoskeletal:     Cervical back: Neck supple.     Right lower leg: No edema.     Left lower leg: No edema.  Skin:    Findings: No rash.  Neurological:     Mental  Status: He is alert and oriented to person, place, and time.     Motor: No weakness.  Psychiatric:        Mood and Affect: Mood normal.        Behavior: Behavior normal.     BP (!) 158/51   Pulse 76   Ht 5\' 5"  (1.651 m)   Wt 204 lb (92.5 kg)   BMI 33.95 kg/m  Wt Readings from Last 3 Encounters:  09/13/20 204 lb (92.5 kg)  07/19/20 205 lb 6.4 oz (93.2 kg)  07/18/20 204 lb 3.2 oz (92.6 kg)    Health Maintenance Due  Topic Date Due  . FOOT EXAM  Never done  . OPHTHALMOLOGY EXAM  Never done  . TETANUS/TDAP  12/10/2013  . HEMOGLOBIN A1C  06/13/2020  . INFLUENZA VACCINE  07/10/2020    There are no preventive care reminders to display for this patient.  CBC Latest Ref Rng & Units 05/24/2020 04/18/2020 12/30/2019  WBC 4.0 - 10.5 K/uL 9.5 10.6(H) 9.1  Hemoglobin  13.0 - 17.0 g/dL 12.4(L) 12.6(L) 11.1(L)  Hematocrit 39 - 52 % 36.6(L) 38.1(L) 35.3(L)  Platelets 150 - 400 K/uL 229 239 266   CMP Latest Ref Rng & Units 07/07/2020 04/18/2020 01/04/2020  Glucose 70 - 99 mg/dL - 194(H) 85  BUN 8 - 23 mg/dL - 52(H) 44(H)  Creatinine 0.61 - 1.24 mg/dL - 1.91(H) 1.93(H)  Sodium 135 - 145 mmol/L 141 137 138  Potassium 3.5 - 5.1 mmol/L 4.4 4.2 5.2(H)  Chloride 98 - 111 mmol/L 106 108 108  CO2 22 - 32 mmol/L 24 19(L) 21(L)  Calcium 8.9 - 10.3 mg/dL - 8.9 9.2  Total Protein 6.5 - 8.1 g/dL - 7.4 -  Total Bilirubin 0.3 - 1.2 mg/dL - 0.5 -  Alkaline Phos 38 - 126 U/L - 65 -  AST 15 - 41 U/L - 19 -  ALT 0 - 44 U/L - 12 -    Lab Results  Component Value Date   TSH 1.948 12/06/2018   Lab Results  Component Value Date   ALBUMIN 4.3 04/18/2020   ANIONGAP 11 07/07/2020   Lab Results  Component Value Date   CHOL 115 12/29/2019   CHOL 107 12/15/2019   CHOL 112 11/15/2018   HDL 31 (L) 12/29/2019   HDL 22 (L) 12/15/2019   HDL 27 (L) 11/15/2018   LDLCALC 51 12/29/2019   LDLCALC 33 12/15/2019   LDLCALC 51 11/15/2018   CHOLHDL 3.7 12/29/2019   CHOLHDL 4.9 12/15/2019   CHOLHDL 4.1  11/15/2018   Lab Results  Component Value Date   TRIG 163 (H) 12/29/2019   Lab Results  Component Value Date   HGBA1C 8.7 (H) 12/15/2019   HGBA1C 8.5 (H) 12/14/2019   HGBA1C 7.2 (H) 11/15/2018      ASSESSMENT & PLAN:   Problem List Items Addressed This Visit      Cardiovascular and Mediastinum   Essential hypertension    - Today, the patient's blood pressure is well managed on entresto. - The patient will continue the current treatment regimen.  - I encouraged the patient to eat a low-sodium diet to help control blood pressure. - I encouraged the patient to live an active lifestyle and complete activities that increases heart rate to 85% target heart rate at least 5 times per week for one hour.            Endocrine   Type 2 diabetes mellitus with stage 4 chronic kidney disease, without long-term current use of insulin ([place])    Patient is being followed up by nephrologist.        Other   Hyperlipidemia    - .  - I encouraged the patient to eat more vegetables and whole wheat, and to avoid fatty foods like whole milk, hard cheese, egg yolks, margarine, baked sweets, and fried foods.  - I encouraged the patient to live an active lifestyle and complete activities for 40 minutes at least three times per week.  - I instructed the patient to go to the ER if they begin having chest pain.       SOB (shortness of breath)    Patient presented with cough shortness of breath coughing up off yellowish phlegm.  He denies any chest pain but was found to be wheezing on the both side of the chest there is no rales.  He is allergic to Levaquin.  He was started on Z-Pak as directed and Tussionex half a teaspoonful twice a day 120 cc was dispensed.  Cough - Primary    Patient has a productive cough rhonchi in the chest.  Started on Z-Pak      Relevant Orders   POC COVID-19 (Completed)      No orders of the defined types were placed in this encounter.   Follow-up: No  follow-ups on file.    Cletis Athens, MD [facility] [address], [place], [place] [address]   By signing my name below, I, [employer], attest that this documentation has been prepared under the direction and in the presence of Dr. Cletis Athens Electronically Signed: Cletis Athens, MD 09/13/20, 3:23 PM  I personally performed the services described in this documentation, which was SCRIBED in my presence. The recorded information has been reviewed and considered accurate. It has been edited as necessary during review. Cletis Athens, MD

## 2020-09-13 NOTE — Assessment & Plan Note (Signed)
Patient has a productive cough rhonchi in the chest.  Started on Z-Pak

## 2020-09-13 NOTE — Assessment & Plan Note (Signed)
-   Today, the patient's blood pressure is well managed on entresto. - The patient will continue the current treatment regimen.  - I encouraged the patient to eat a low-sodium diet to help control blood pressure. - I encouraged the patient to live an active lifestyle and complete activities that increases heart rate to 85% target heart rate at least 5 times per week for one hour.

## 2020-09-13 NOTE — Assessment & Plan Note (Signed)
Patient presented with cough shortness of breath coughing up off yellowish phlegm.  He denies any chest pain but was found to be wheezing on the both side of the chest there is no rales.  He is allergic to Levaquin.  He was started on Z-Pak as directed and Tussionex half a teaspoonful twice a day 120 cc was dispensed.

## 2020-09-13 NOTE — Assessment & Plan Note (Signed)
- .  -   I encouraged the patient to eat more vegetables and whole wheat, and to avoid fatty foods like whole milk, hard cheese, egg yolks, margarine, baked sweets, and fried foods.  - I encouraged the patient to live an active lifestyle and complete activities for 40 minutes at least three times per week.  - I instructed the patient to go to the ER if they begin having chest pain.   

## 2020-09-14 DIAGNOSIS — N184 Chronic kidney disease, stage 4 (severe): Secondary | ICD-10-CM | POA: Diagnosis not present

## 2020-09-14 DIAGNOSIS — I129 Hypertensive chronic kidney disease with stage 1 through stage 4 chronic kidney disease, or unspecified chronic kidney disease: Secondary | ICD-10-CM | POA: Diagnosis not present

## 2020-09-14 DIAGNOSIS — D631 Anemia in chronic kidney disease: Secondary | ICD-10-CM | POA: Diagnosis not present

## 2020-09-14 DIAGNOSIS — E871 Hypo-osmolality and hyponatremia: Secondary | ICD-10-CM | POA: Insufficient documentation

## 2020-09-14 DIAGNOSIS — E1122 Type 2 diabetes mellitus with diabetic chronic kidney disease: Secondary | ICD-10-CM | POA: Diagnosis not present

## 2020-09-14 DIAGNOSIS — E875 Hyperkalemia: Secondary | ICD-10-CM | POA: Diagnosis not present

## 2020-09-14 DIAGNOSIS — N2581 Secondary hyperparathyroidism of renal origin: Secondary | ICD-10-CM | POA: Diagnosis not present

## 2020-09-14 DIAGNOSIS — E872 Acidosis: Secondary | ICD-10-CM | POA: Diagnosis not present

## 2020-09-20 ENCOUNTER — Encounter: Payer: Self-pay | Admitting: Internal Medicine

## 2020-09-20 ENCOUNTER — Ambulatory Visit (INDEPENDENT_AMBULATORY_CARE_PROVIDER_SITE_OTHER): Payer: Medicare Other | Admitting: Internal Medicine

## 2020-09-20 ENCOUNTER — Other Ambulatory Visit: Payer: Self-pay

## 2020-09-20 VITALS — BP 147/65 | HR 66 | Temp 98.7°F | Ht 66.0 in | Wt 204.0 lb

## 2020-09-20 DIAGNOSIS — I6523 Occlusion and stenosis of bilateral carotid arteries: Secondary | ICD-10-CM

## 2020-09-20 DIAGNOSIS — R0602 Shortness of breath: Secondary | ICD-10-CM

## 2020-09-20 DIAGNOSIS — I10 Essential (primary) hypertension: Secondary | ICD-10-CM | POA: Diagnosis not present

## 2020-09-20 DIAGNOSIS — N184 Chronic kidney disease, stage 4 (severe): Secondary | ICD-10-CM | POA: Diagnosis not present

## 2020-09-20 DIAGNOSIS — E1122 Type 2 diabetes mellitus with diabetic chronic kidney disease: Secondary | ICD-10-CM

## 2020-09-20 DIAGNOSIS — I700 Atherosclerosis of aorta: Secondary | ICD-10-CM

## 2020-09-20 MED ORDER — AZITHROMYCIN 250 MG PO TABS: ORAL_TABLET | ORAL | 0 refills | Status: DC

## 2020-09-20 NOTE — Assessment & Plan Note (Signed)
Patient is on Crestor 40 mg p.o. daily.  He also follows low-cholesterol diet.  Because of his health condition he has gained some weight so he was advised to lose weight the best he can.

## 2020-09-20 NOTE — Assessment & Plan Note (Signed)
Patient was very short of breath and he was coughing continuously.  Cough is more productive and sometimes greenish in color.  He cannot tolerate steroid.  He also cannot tolerate Levaquin.  Patient was given another course of Zithromax.  Was advised to take albuterol treatment 4 times a day and use albuterol treatment up to 3 times a day as needed.  He was advised to follow-up with the pulmonary specialist for further evaluation.  He may see the ENT specialist.

## 2020-09-20 NOTE — Progress Notes (Signed)
Established Patient Office Visit  SUBJECTIVE:  Subjective  Patient ID: Mike Decker, male    DOB: [birth year]  Age: 79 y.o. MRN: [id number]  CC:  Chief Complaint  Patient presents with  . URI    Patient is here today for a 1 week follow up from coughing, pt states he is still coughing and running a low grade fever today.    HPI Mike Decker is a 79 y.o. male presenting today for a follow up of URI.    He notes that he is still coughing and short of breath. He is also wheezing. He will still occasionally cough up a mucus plug in greenish/yellow color. He states that he has had some low grade fever at home. He does have an inhaler at home; he also has a rescue inhaler  But he hasn't been using it.      Past Medical History:  Diagnosis Date  . Anemia   . Arthritis   . Atrioventricular canal (AVC)    irregular heart beats  . Barrett esophagus   . 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 Chronic diastolic CHF (congestive heart failure) ([place])   . Colon polyp   . Diabetes mellitus without complication ([place])   . Diverticulosis   . Gout   . Heart disease   . Hemangioma    liver  . Hyperlipidemia   . Hypertension   . Myocardial infarct ([place])   . Ocular hypertension   . Peripheral vascular disease ([place])   . Skin cancer   . 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[place])   . Sleep apnea   . Vitreoretinal degeneration     Past Surgical History:  Procedure Laterality Date  . CATARACT EXTRACTION  2011, 2012  . COLONOSCOPY N/A 12/14/2018   Procedure: COLONOSCOPY;  Surgeon: Virgel Manifold, MD;  Location: [facility] ENDOSCOPY;  Service: Endoscopy;  Laterality: N/A;  . CORONARY ANGIOPLASTY WITH STENT PLACEMENT  2012  . CORONARY STENT INTERVENTION N/A 12/29/2019   Procedure: CORONARY STENT INTERVENTION;  Surgeon: Nelva Bush, MD;  Location: [facility];  Service: Cardiovascular;  Laterality: N/A;  . ESOPHAGOGASTRODUODENOSCOPY N/A 12/14/2018   Procedure: ESOPHAGOGASTRODUODENOSCOPY  (EGD);  Surgeon: Virgel Manifold, MD;  Location: [facility] ENDOSCOPY;  Service: Endoscopy;  Laterality: N/A;  . ESOPHAGOGASTRODUODENOSCOPY (EGD) WITH PROPOFOL N/A 06/01/2016   Procedure: ESOPHAGOGASTRODUODENOSCOPY (EGD) WITH PROPOFOL;  Surgeon: Manya Silvas, MD;  Location: [facility] ENDOSCOPY;  Service: Endoscopy;  Laterality: N/A;  . HERNIA REPAIR     x2  . INTRAOCULAR LENS INSERTION    . LEFT HEART CATH AND CORONARY ANGIOGRAPHY N/A 05/09/2017   Procedure: Left Heart Cath and Coronary Angiography;  Surgeon: Yolonda Kida, MD;  Location: [facility];  Service: Cardiovascular;  Laterality: N/A;  . LEFT HEART CATH AND CORONARY ANGIOGRAPHY N/A 12/08/2018   Procedure: LEFT HEART CATH AND CORONARY ANGIOGRAPHY and possible PCI and stent;  Surgeon: Yolonda Kida, MD;  Location: [facility];  Service: Cardiovascular;  Laterality: N/A;  . LEFT HEART CATH AND CORONARY ANGIOGRAPHY N/A 12/29/2019   Procedure: LEFT HEART CATH AND CORONARY ANGIOGRAPHY;  Surgeon: Nelva Bush, MD;  Location: [facility];  Service: Cardiovascular;  Laterality: N/A;  . NOSE SURGERY     submucous resection  . PROSTATE SURGERY  2002  . ROTATOR CUFF REPAIR Right     Family History  Problem Relation Age of Onset  . Arthritis Mother   . Stroke Maternal Grandfather   .  Breast cancer Neg Hx     Social History   Socioeconomic History  . Marital status: Married    Spouse name: Not on file  . Number of children: Not on file  . Years of education: Not on file  . Highest education level: Not on file  Occupational History  . Not on file  Tobacco Use  . Smoking status: Former Smoker    Years: 20.00    Types: Cigarettes    Quit date: 12/10/1976    Years since quitting: 43.8  . Smokeless tobacco: Never Used  Substance and Sexual Activity  . Alcohol use: No  . Drug use: No  . Sexual activity: Not on file  Other Topics Concern  . Not on file  Social History Narrative  . Not on file    Social Determinants of Health   Financial Resource Strain:   . Difficulty of Paying Living Expenses: Not on file  Food Insecurity:   . Worried About Charity fundraiser in the Last Year: Not on file  . Ran Out of Food in the Last Year: Not on file  Transportation Needs:   . Lack of Transportation (Medical): Not on file  . Lack of Transportation (Non-Medical): Not on file  Physical Activity:   . Days of Exercise per Week: Not on file  . Minutes of Exercise per Session: Not on file  Stress:   . Feeling of Stress : Not on file  Social Connections:   . Frequency of Communication with Friends and Family: Not on file  . Frequency of Social Gatherings with Friends and Family: Not on file  . Attends Religious Services: Not on file  . Active Member of Clubs or Organizations: Not on file  . Attends Archivist Meetings: Not on file  . Marital Status: Not on file  Intimate Partner Violence:   . Fear of Current or Ex-Partner: Not on file  . Emotionally Abused: Not on file  . Physically Abused: Not on file  . Sexually Abused: Not on file     Current Outpatient Medications:  .  acetaminophen (TYLENOL) 500 MG tablet, Take 1,000 mg by mouth every 6 (six) hours as needed for mild pain, moderate pain or headache., Disp: , Rfl:  .  allopurinol (ZYLOPRIM) 100 MG tablet, Take 100 mg by mouth daily. , Disp: , Rfl:  .  amLODipine (NORVASC) 5 MG tablet, Take 5 mg by mouth 2 (two) times daily. , Disp: , Rfl:  .  aspirin EC 81 MG EC tablet, Take 1 tablet (81 mg total) by mouth daily., Disp: 60 tablet, Rfl: 0 .  azelastine (ASTELIN) 0.1 % nasal spray, USE 1 TO 2 SPRAYS IN EACH NOSTRIL TWICE A DAY, Disp: , Rfl:  .  budesonide-formoterol (SYMBICORT) 160-4.5 MCG/ACT inhaler, Inhale 2 puffs into the lungs 2 (two) times daily as needed. , Disp: , Rfl:  .  cetirizine (ZYRTEC) 5 MG tablet, Take 5 mg by mouth daily as needed for allergies or rhinitis. , Disp: , Rfl:  .  citalopram (CELEXA) 10 MG  tablet, Take 10 mg by mouth daily., Disp: , Rfl:  .  clopidogrel (PLAVIX) 75 MG tablet, Take 1 tablet (75 mg total) by mouth daily., Disp: 30 tablet, Rfl: 11 .  colchicine 0.6 MG tablet, Take 3 tablets (1.8 mg total) by mouth daily as needed (gout flares)., Disp: , Rfl:  .  dextromethorphan-guaiFENesin (MUCINEX DM) 30-600 MG 12hr tablet, Take 1 tablet by mouth 2 (two) times daily  as needed for cough., Disp: , Rfl:  .  EPINEPHrine (EPI-PEN) 0.3 mg/0.3 mL SOAJ injection, Inject into the muscle as needed (anaphylaxis). , Disp: , Rfl:  .  ferrous sulfate 325 (65 FE) MG EC tablet, Take 1 tablet (325 mg total) by mouth daily., Disp: 90 tablet, Rfl: 3 .  fluticasone (FLONASE) 50 MCG/ACT nasal spray, USE 1 SPRAY IN EACH NOSTRIL EVERY DAY, Disp: 32 g, Rfl: 6 .  folic acid (V-R FOLIC ACID) 009 MCG tablet, Take 1 tablet (400 mcg total) by mouth daily., Disp: 90 tablet, Rfl: 3 .  furosemide (LASIX) 20 MG tablet, Take 2 tablets (40 mg total) by mouth daily. (Patient taking differently: Take 60 mg by mouth daily. ), Disp: , Rfl:  .  hydrALAZINE (APRESOLINE) 25 MG tablet, Take 1 tablet (25 mg total) by mouth every 8 (eight) hours. (Patient taking differently: Take 50 mg by mouth 2 (two) times daily. ), Disp: 60 tablet, Rfl: 0 .  insulin aspart (NOVOLOG) 100 UNIT/ML FlexPen, 14 UNITS IN AM, 18 UNITS AT LUNCH, 24 UNITS AT DINNER, Disp: , Rfl:  .  insulin glargine (LANTUS) 100 UNIT/ML injection, Inject 0.35 mLs (35 Units total) into the skin at bedtime. This is a decrease from your previous 45 units nightly., Disp: 10 mL, Rfl: 11 .  ipratropium-albuterol (DUONEB) 0.5-2.5 (3) MG/3ML SOLN, Take 3 mLs by nebulization every 4 (four) hours as needed., Disp: 360 mL, Rfl: 0 .  isosorbide mononitrate (IMDUR) 60 MG 24 hr tablet, Take 60 mg by mouth daily. , Disp: , Rfl:  .  methocarbamol (ROBAXIN) 500 MG tablet, Take 500 mg by mouth 2 (two) times daily. , Disp: , Rfl: 0 .  metoprolol tartrate (LOPRESSOR) 50 MG tablet, Take 25  mg by mouth 3 (three) times daily. , Disp: , Rfl:  .  montelukast (SINGULAIR) 10 MG tablet, TAKE 1 TABLET BY MOUTH EVERYDAY AT BEDTIME, Disp: 90 tablet, Rfl: 1 .  nitroGLYCERIN (NITROSTAT) 0.4 MG SL tablet, Place 0.4 mg under the tongue every 5 (five) minutes x 3 doses as needed for chest pain. , Disp: , Rfl:  .  Omega-3 Fatty Acids (FISH OIL) 1000 MG CAPS, Take 1 capsule by mouth daily. , Disp: , Rfl:  .  pantoprazole (PROTONIX) 40 MG tablet, Take 1 tablet (40 mg total) by mouth 2 (two) times daily before a meal., Disp: , Rfl:  .  rosuvastatin (CRESTOR) 40 MG tablet, Take 40 mg by mouth every evening. , Disp: , Rfl:  .  sodium bicarbonate 650 MG tablet, Take 650 mg by mouth 2 (two) times daily., Disp: , Rfl:  .  vitamin B-12 (CYANOCOBALAMIN) 1000 MCG tablet, Take 1 tablet (1,000 mcg total) by mouth daily., Disp: 90 tablet, Rfl: 3 .  vitamin C (ASCORBIC ACID) 250 MG tablet, Take 250 mg by mouth daily., Disp: , Rfl:  .  Vitamin D, Ergocalciferol, (DRISDOL) 50000 units CAPS capsule, Take 50,000 Units by mouth every 30 (thirty) days. , Disp: , Rfl: 1 .  zinc gluconate 50 MG tablet, Take 50 mg by mouth daily., Disp: , Rfl:  .  azithromycin (ZITHROMAX) 250 MG tablet, 2 tab  Daily for 3 days, Disp: 6 tablet, Rfl: 0   Allergies  Allergen Reactions  . Gabapentin     Other reaction(s): Other (See Comments) Tremors  . Peanut-Containing Drug Products Anaphylaxis  . Penicillins Hives and Rash    Has patient had a PCN reaction causing immediate rash, facial/tongue/throat swelling, SOB or lightheadedness with hypotension:  Yes Has patient had a PCN reaction causing severe rash involving mucus membranes or skin necrosis: No Has patient had a PCN reaction that required hospitalization No Has patient had a PCN reaction occurring within the last 10 years: No If all of the above answers are "NO", then may proceed with Cephalosporin use.  . Bee Venom Swelling  . Influenza Vaccines Hives  . Inh [Isoniazid]  Hives  . Kenalog [Triamcinolone Acetonide] Hives  . Levaquin [Levofloxacin] Other (See Comments)    Tendon, ligament pain.   . Nalfon [Fenoprofen Calcium] Hives  . Naproxen   . Peanut Oil   . Nsaids Rash    Nalfon 600 Nalfon 600    ROS Review of Systems  Constitutional: Positive for chills (very mild, alternating between hot and cold), fatigue and fever.  HENT: Positive for rhinorrhea. Negative for ear pain and sinus pain.   Eyes: Negative.   Respiratory: Positive for cough and wheezing.   Cardiovascular: Negative.  Negative for chest pain.  Gastrointestinal: Negative.   Endocrine: Negative.   Genitourinary: Negative.   Musculoskeletal: Negative.   Skin: Negative.   Allergic/Immunologic: Negative.   Neurological: Negative.   Hematological: Negative.   Psychiatric/Behavioral: Negative.   All other systems reviewed and are negative.    OBJECTIVE:    Physical Exam Vitals reviewed.  Constitutional:      Appearance: Normal appearance.  HENT:     Mouth/Throat:     Mouth: Mucous membranes are moist.  Eyes:     Pupils: Pupils are equal, round, and reactive to light.  Neck:     Vascular: No carotid bruit.  Cardiovascular:     Rate and Rhythm: Normal rate and regular rhythm.     Pulses: Normal pulses.     Heart sounds: Normal heart sounds.  Pulmonary:     Effort: Pulmonary effort is normal.     Breath sounds: Wheezing present.  Abdominal:     General: Bowel sounds are normal.     Palpations: Abdomen is soft. There is no hepatomegaly, splenomegaly or mass.     Tenderness: There is no abdominal tenderness.     Hernia: No hernia is present.  Musculoskeletal:     Cervical back: Neck supple.     Right lower leg: No edema.     Left lower leg: No edema.  Skin:    Findings: No rash.  Neurological:     Mental Status: He is alert and oriented to person, place, and time.     Motor: No weakness.  Psychiatric:        Mood and Affect: Mood normal.        Behavior: Behavior  normal.     BP (!) 147/65   Pulse 66   Temp 98.7 F (37.1 C) (Oral)   Ht 5\' 6"  (1.676 m)   Wt 204 lb (92.5 kg)   SpO2 96%   BMI 32.93 kg/m  Wt Readings from Last 3 Encounters:  09/20/20 204 lb (92.5 kg)  09/13/20 204 lb (92.5 kg)  07/19/20 205 lb 6.4 oz (93.2 kg)    Health Maintenance Due  Topic Date Due  . FOOT EXAM  Never done  . OPHTHALMOLOGY EXAM  Never done  . TETANUS/TDAP  12/10/2013  . HEMOGLOBIN A1C  06/13/2020  . INFLUENZA VACCINE  07/10/2020    There are no preventive care reminders to display for this patient.  CBC Latest Ref Rng & Units 05/24/2020 04/18/2020 12/30/2019  WBC 4.0 - 10.5 K/uL 9.5 10.6(H) 9.1  Hemoglobin 13.0 - 17.0 g/dL 12.4(L) 12.6(L) 11.1(L)  Hematocrit 39 - 52 % 36.6(L) 38.1(L) 35.3(L)  Platelets 150 - 400 K/uL 229 239 266   CMP Latest Ref Rng & Units 07/07/2020 04/18/2020 01/04/2020  Glucose 70 - 99 mg/dL - 194(H) 85  BUN 8 - 23 mg/dL - 52(H) 44(H)  Creatinine 0.61 - 1.24 mg/dL - 1.91(H) 1.93(H)  Sodium 135 - 145 mmol/L 141 137 138  Potassium 3.5 - 5.1 mmol/L 4.4 4.2 5.2(H)  Chloride 98 - 111 mmol/L 106 108 108  CO2 22 - 32 mmol/L 24 19(L) 21(L)  Calcium 8.9 - 10.3 mg/dL - 8.9 9.2  Total Protein 6.5 - 8.1 g/dL - 7.4 -  Total Bilirubin 0.3 - 1.2 mg/dL - 0.5 -  Alkaline Phos 38 - 126 U/L - 65 -  AST 15 - 41 U/L - 19 -  ALT 0 - 44 U/L - 12 -    Lab Results  Component Value Date   TSH 1.948 12/06/2018   Lab Results  Component Value Date   ALBUMIN 4.3 04/18/2020   ANIONGAP 11 07/07/2020   Lab Results  Component Value Date   CHOL 115 12/29/2019   CHOL 107 12/15/2019   CHOL 112 11/15/2018   HDL 31 (L) 12/29/2019   HDL 22 (L) 12/15/2019   HDL 27 (L) 11/15/2018   LDLCALC 51 12/29/2019   LDLCALC 33 12/15/2019   LDLCALC 51 11/15/2018   CHOLHDL 3.7 12/29/2019   CHOLHDL 4.9 12/15/2019   CHOLHDL 4.1 11/15/2018   Lab Results  Component Value Date   TRIG 163 (H) 12/29/2019   Lab Results  Component Value Date   HGBA1C 8.7 (H)  12/15/2019   HGBA1C 8.5 (H) 12/14/2019   HGBA1C 7.2 (H) 11/15/2018      ASSESSMENT & PLAN:   Problem List Items Addressed This Visit      Cardiovascular and Mediastinum   Essential hypertension - Primary    Patient blood pressure is 147/65 is it is well controlled on Entresto and hydralazine.      Aortic atherosclerosis ([place])    Patient is on Crestor 40 mg p.o. daily.  He also follows low-cholesterol diet.  Because of his health condition he has gained some weight so he was advised to lose weight the best he can.        Endocrine   Type 2 diabetes mellitus with stage 4 chronic kidney disease, without long-term current use of insulin ([facility])    Blood sugar is under control on present medication.        Other   SOB (shortness of breath)    Patient was very short of breath and he was coughing continuously.  Cough is more productive and sometimes greenish in color.  He cannot tolerate steroid.  He also cannot tolerate Levaquin.  Patient was given another course of Zithromax.  Was advised to take albuterol treatment 4 times a day and use albuterol treatment up to 3 times a day as needed.  He was advised to follow-up with the pulmonary specialist for further evaluation.  He may see the ENT specialist.         Meds ordered this encounter  Medications  . azithromycin (ZITHROMAX) 250 MG tablet    Sig: 2 tab  Daily for 3 days    Dispense:  6 tablet    Refill:  0    Follow-up: No follow-ups on file.    Cletis Athens, MD [facility] [address],  [place], [place] [address]   By signing my name below, I, [employer], attest that this documentation has been prepared under the direction and in the presence of Dr. Cletis Athens Electronically Signed: Cletis Athens, MD 09/20/20, 3:21 PM .I personally performed the services described in this documentation, which was SCRIBED in my presence. The recorded information has been reviewed and considered accurate. It has been  edited as necessary during review. Cletis Athens, MD

## 2020-09-20 NOTE — Assessment & Plan Note (Signed)
Blood sugar is under control on present medication.

## 2020-09-20 NOTE — Assessment & Plan Note (Signed)
Patient blood pressure is 147/65 is it is well controlled on Entresto and hydralazine.

## 2020-09-22 ENCOUNTER — Ambulatory Visit
Admission: RE | Admit: 2020-09-22 | Discharge: 2020-09-22 | Disposition: A | Discharge status: Home / Self Care | Payer: Medicare Other | Source: Ambulatory Visit | Attending: Hospice and Palliative Medicine | Admitting: Hospice and Palliative Medicine

## 2020-09-22 ENCOUNTER — Ambulatory Visit
Admission: RE | Admit: 2020-09-22 | Discharge: 2020-09-22 | Disposition: A | Discharge status: Home / Self Care | Payer: Medicare Other | Attending: Hospice and Palliative Medicine | Admitting: Hospice and Palliative Medicine

## 2020-09-22 ENCOUNTER — Other Ambulatory Visit: Payer: Self-pay

## 2020-09-22 ENCOUNTER — Ambulatory Visit (INDEPENDENT_AMBULATORY_CARE_PROVIDER_SITE_OTHER): Payer: Medicare Other | Admitting: Internal Medicine

## 2020-09-22 ENCOUNTER — Encounter: Payer: Self-pay | Admitting: Internal Medicine

## 2020-09-22 DIAGNOSIS — R059 Cough, unspecified: Secondary | ICD-10-CM | POA: Diagnosis not present

## 2020-09-22 DIAGNOSIS — I10 Essential (primary) hypertension: Secondary | ICD-10-CM | POA: Diagnosis not present

## 2020-09-22 DIAGNOSIS — J441 Chronic obstructive pulmonary disease with (acute) exacerbation: Secondary | ICD-10-CM

## 2020-09-22 DIAGNOSIS — I5022 Chronic systolic (congestive) heart failure: Secondary | ICD-10-CM

## 2020-09-22 DIAGNOSIS — I6523 Occlusion and stenosis of bilateral carotid arteries: Secondary | ICD-10-CM | POA: Diagnosis not present

## 2020-09-22 MED ORDER — PREDNISONE 10 MG PO TABS: ORAL_TABLET | ORAL | 0 refills | Status: DC

## 2020-09-22 MED ORDER — AZITHROMYCIN 250 MG PO TABS: ORAL_TABLET | ORAL | 0 refills | Status: DC

## 2020-09-22 NOTE — Progress Notes (Signed)
[facility], [facility], [place] [address]  Pulmonary Sleep Medicine   Office Visit Note  Patient Name: Donna Silverman DOB: 1941-03-12 MRN [id number]  Date of Service: 09/24/2020  Complaints/HPI: Patient presents for acute pulmonary visit He has been followed and managed by his PCP for about 2 weeks for "bronchitis" He has completed 1 round of Azithromycin and has been prescribed a second round but he has not started this--his PCP advised him to be seen by pulmonology as his symptoms are worsening and not improving His is complaining of shortness of breath at rest and with exertion, coughing as well as wheezing--he has been afebrile, cough is dry, non-productive Has been using inhalers daily as well as his nebulizer often without any relief in his symptoms History of COPD as well as CHF--last spirometry was normal  It is noted that he is on multiple different medications for BP as well as HR, today BP is lower than his baseline  ROS  General: (-) fever, (-) chills, (-) night sweats, (-) weakness Skin: (-) rashes, (-) itching,. Eyes: (-) visual changes, (-) redness, (-) itching. Nose and Sinuses: (-) nasal stuffiness or itchiness, (-) postnasal drip, (-) nosebleeds, (-) sinus trouble. Mouth and Throat: (-) sore throat, (-) hoarseness. Neck: (-) swollen glands, (-) enlarged thyroid, (-) neck pain. Respiratory: + cough, (-) bloody sputum, + shortness of breath, + wheezing. Cardiovascular: - ankle swelling, (-) chest pain. Lymphatic: (-) lymph node enlargement. Neurologic: (-) numbness, (-) tingling. Psychiatric: (-) anxiety, (-) depression   Current Medication: Outpatient Encounter Medications as of 09/22/2020  Medication Sig Note  . acetaminophen (TYLENOL) 500 MG tablet Take 1,000 mg by mouth every 6 (six) hours as needed for mild pain, moderate pain or headache.   . allopurinol (ZYLOPRIM) 100 MG tablet Take 100 mg by mouth daily.    Marland Kitchen amLODipine  (NORVASC) 5 MG tablet Take 5 mg by mouth 2 (two) times daily.    Marland Kitchen aspirin EC 81 MG EC tablet Take 1 tablet (81 mg total) by mouth daily.   Marland Kitchen azelastine (ASTELIN) 0.1 % nasal spray USE 1 TO 2 SPRAYS IN EACH NOSTRIL TWICE A DAY   . budesonide-formoterol (SYMBICORT) 160-4.5 MCG/ACT inhaler Inhale 2 puffs into the lungs 2 (two) times daily as needed.    . cetirizine (ZYRTEC) 5 MG tablet Take 5 mg by mouth daily as needed for allergies or rhinitis.    . citalopram (CELEXA) 10 MG tablet Take 10 mg by mouth daily.   . clopidogrel (PLAVIX) 75 MG tablet Take 1 tablet (75 mg total) by mouth daily.   . colchicine 0.6 MG tablet Take 3 tablets (1.8 mg total) by mouth daily as needed (gout flares).   Marland Kitchen dextromethorphan-guaiFENesin (MUCINEX DM) 30-600 MG 12hr tablet Take 1 tablet by mouth 2 (two) times daily as needed for cough.   . EPINEPHrine (EPI-PEN) 0.3 mg/0.3 mL SOAJ injection Inject into the muscle as needed (anaphylaxis).    . ferrous sulfate 325 (65 FE) MG EC tablet Take 1 tablet (325 mg total) by mouth daily.   . fluticasone (FLONASE) 50 MCG/ACT nasal spray USE 1 SPRAY IN EACH NOSTRIL EVERY DAY   . folic acid (V-R FOLIC ACID) 419 MCG tablet Take 1 tablet (400 mcg total) by mouth daily.   . furosemide (LASIX) 20 MG tablet Take 2 tablets (40 mg total) by mouth daily. (Patient taking differently: Take 60 mg by mouth 2 (two) times daily. Takes 40 mg in morning and if  needed takes 40 mg in the evening)   . hydrALAZINE (APRESOLINE) 25 MG tablet Take 1 tablet (25 mg total) by mouth every 8 (eight) hours. (Patient taking differently: Take 50 mg by mouth 2 (two) times daily. )   . insulin aspart (NOVOLOG) 100 UNIT/ML FlexPen 14 UNITS IN AM, 18 UNITS AT LUNCH, 24 UNITS AT DINNER   . insulin glargine (LANTUS) 100 UNIT/ML injection Inject 0.35 mLs (35 Units total) into the skin at bedtime. This is a decrease from your previous 45 units nightly.   Marland Kitchen ipratropium-albuterol (DUONEB) 0.5-2.5 (3) MG/3ML SOLN Take 3 mLs  by nebulization every 4 (four) hours as needed.   . isosorbide mononitrate (IMDUR) 60 MG 24 hr tablet Take 120 mg by mouth daily.    . methocarbamol (ROBAXIN) 500 MG tablet Take 500 mg by mouth 2 (two) times daily.    . metoprolol tartrate (LOPRESSOR) 50 MG tablet Take 25 mg by mouth 3 (three) times daily.    . montelukast (SINGULAIR) 10 MG tablet TAKE 1 TABLET BY MOUTH EVERYDAY AT BEDTIME   . nitroGLYCERIN (NITROSTAT) 0.4 MG SL tablet Place 0.4 mg under the tongue every 5 (five) minutes x 3 doses as needed for chest pain.    . Omega-3 Fatty Acids (FISH OIL) 1000 MG CAPS Take 1 capsule by mouth daily.    . pantoprazole (PROTONIX) 40 MG tablet Take 1 tablet (40 mg total) by mouth 2 (two) times daily before a meal.   . rosuvastatin (CRESTOR) 40 MG tablet Take 40 mg by mouth every evening.    . sodium bicarbonate 650 MG tablet Take 650 mg by mouth 2 (two) times daily.   . vitamin B-12 (CYANOCOBALAMIN) 1000 MCG tablet Take 1 tablet (1,000 mcg total) by mouth daily.   . vitamin C (ASCORBIC ACID) 250 MG tablet Take 250 mg by mouth daily.   . Vitamin D, Ergocalciferol, (DRISDOL) 50000 units CAPS capsule Take 50,000 Units by mouth every 30 (thirty) days.  12/14/2019: 23rd   . zinc gluconate 50 MG tablet Take 50 mg by mouth daily.   . [DISCONTINUED] azithromycin (ZITHROMAX) 250 MG tablet 2 tab  Daily for 3 days   . azithromycin (ZITHROMAX) 250 MG tablet Take 1 tablet daily for 14 days   . predniSONE (DELTASONE) 10 MG tablet Take 2 tablets daily for 1 week followed by 1 tablet daily for 1 week.    No facility-administered encounter medications on file as of 09/22/2020.    Surgical History: Past Surgical History:  Procedure Laterality Date  . CATARACT EXTRACTION  2011, 2012  . COLONOSCOPY N/A 12/14/2018   Procedure: COLONOSCOPY;  Surgeon: Virgel Manifold, MD;  Location: [facility] ENDOSCOPY;  Service: Endoscopy;  Laterality: N/A;  . CORONARY ANGIOPLASTY WITH STENT PLACEMENT  2012  . CORONARY STENT  INTERVENTION N/A 12/29/2019   Procedure: CORONARY STENT INTERVENTION;  Surgeon: Nelva Bush, MD;  Location: [place] [facility] LAB;  Service: Cardiovascular;  Laterality: N/A;  . ESOPHAGOGASTRODUODENOSCOPY N/A 12/14/2018   Procedure: ESOPHAGOGASTRODUODENOSCOPY (EGD);  Surgeon: Virgel Manifold, MD;  Location: [facility] ENDOSCOPY;  Service: Endoscopy;  Laterality: N/A;  . ESOPHAGOGASTRODUODENOSCOPY (EGD) WITH PROPOFOL N/A 06/01/2016   Procedure: ESOPHAGOGASTRODUODENOSCOPY (EGD) WITH PROPOFOL;  Surgeon: Manya Silvas, MD;  Location: [facility] ENDOSCOPY;  Service: Endoscopy;  Laterality: N/A;  . HERNIA REPAIR     x2  . INTRAOCULAR LENS INSERTION    . LEFT HEART CATH AND CORONARY ANGIOGRAPHY N/A 05/09/2017   Procedure: Left Heart Cath and Coronary Angiography;  Surgeon:  Callwood, Dwayne D, MD;  Location: [place] [facility];  Service: Cardiovascular;  Laterality: N/A;  . LEFT HEART CATH AND CORONARY ANGIOGRAPHY N/A 12/08/2018   Procedure: LEFT HEART CATH AND CORONARY ANGIOGRAPHY and possible PCI and stent;  Surgeon: Yolonda Kida, MD;  Location: [place] [facility];  Service: Cardiovascular;  Laterality: N/A;  . LEFT HEART CATH AND CORONARY ANGIOGRAPHY N/A 12/29/2019   Procedure: LEFT HEART CATH AND CORONARY ANGIOGRAPHY;  Surgeon: Nelva Bush, MD;  Location: [place] [facility];  Service: Cardiovascular;  Laterality: N/A;  . NOSE SURGERY     submucous resection  . PROSTATE SURGERY  2002  . ROTATOR CUFF REPAIR Right     Medical History: Past Medical History:  Diagnosis Date  . Anemia   . Arthritis   . Atrioventricular canal (AVC)    irregular heart beats  . Barrett esophagus   . Bronchiolitis   . Cancer ([place]) 2002   prostate, esphageal  . Chronic diastolic CHF (congestive heart failure) ([place])   . Colon polyp   . Diabetes mellitus without complication ([place])   . Diverticulosis   . Gout   . Heart disease   . Hemangioma    liver  . Hyperlipidemia   . Hypertension   .  Myocardial infarct ([place])   . Ocular hypertension   . Peripheral vascular disease ([place])   . Skin cancer   . 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[place])   . Sleep apnea   . Vitreoretinal degeneration     Family History: Family History  Problem Relation Age of Onset  . Arthritis Mother   . Stroke Maternal Grandfather   . Breast cancer Neg Hx     Social History: Social History   Socioeconomic History  . Marital status: Married    Spouse name: Not on file  . Number of children: Not on file  . Years of education: Not on file  . Highest education level: Not on file  Occupational History  . Not on file  Tobacco Use  . Smoking status: Former Smoker    Years: 20.00    Types: Cigarettes    Quit date: 12/10/1976    Years since quitting: 43.8  . Smokeless tobacco: Never Used  Substance and Sexual Activity  . Alcohol use: No  . Drug use: No  . Sexual activity: Not on file  Other Topics Concern  . Not on file  Social History Narrative  . Not on file   Social Determinants of Health   Financial Resource Strain:   . Difficulty of Paying Living Expenses: Not on file  Food Insecurity:   . Worried About Charity fundraiser in the Last Year: Not on file  . Ran Out of Food in the Last Year: Not on file  Transportation Needs:   . Lack of Transportation (Medical): Not on file  . Lack of Transportation (Non-Medical): Not on file  Physical Activity:   . Days of Exercise per Week: Not on file  . Minutes of Exercise per Session: Not on file  Stress:   . Feeling of Stress : Not on file  Social Connections:   . Frequency of Communication with Friends and Family: Not on file  . Frequency of Social Gatherings with Friends and Family: Not on file  . Attends Religious Services: Not on file  . Active Member of Clubs or Organizations: Not on file  . Attends Archivist Meetings: Not on file  . Marital Status: Not on file  Intimate  Partner Violence:   . Fear of Current or Ex-Partner: Not on file  .  Emotionally Abused: Not on file  . Physically Abused: Not on file  . Sexually Abused: Not on file    Vital Signs: Blood pressure (!) 128/58, pulse 63, temperature 98.4 F (36.9 C), resp. rate 16, height 5\' 6"  (1.676 m), weight 207 lb 9.6 oz (94.2 kg), SpO2 97 %.  Examination: General Appearance: The patient is well-developed, well-nourished, and in no distress. Skin: Gross inspection of skin unremarkable. Head: normocephalic, no gross deformities. Eyes: no gross deformities noted. ENT: ears appear grossly normal no exudates. Neck: Supple. No thyromegaly. No LAD. Respiratory: Diminished throughout with wheezing prominent in bilateral bases. Cardiovascular: Normal S1 and S2 without murmur or rub. Extremities: No cyanosis. pulses are equal. Neurologic: Alert and oriented. No involuntary movements.  LABS: Recent Results (from the past 2160 hour(s))  B Nat Peptide     Status: Abnormal   Collection Time: 07/07/20  1:07 PM  Result Value Ref Range   B Natriuretic Peptide 122.9 (H) 0.0 - 100.0 pg/mL    Comment: Performed at [facility], [address] [place], [place] [address]  Electrolyte panel     Status: None   Collection Time: 07/07/20  1:07 PM  Result Value Ref Range   Sodium 141 135 - 145 mmol/L   Potassium 4.4 3.5 - 5.1 mmol/L   Chloride 106 98 - 111 mmol/L   CO2 24 22 - 32 mmol/L   Anion gap 11 5 - 15    Comment: Performed at [facility], [address] [place], [place] [address]  [place] COVID-19     Status: Normal   Collection Time: 09/13/20  2:41 PM  Result Value Ref Range   SARS Coronavirus 2 Ag Negative Negative    Radiology: DG Chest 2 View  Result Date: 07/18/2020 CLINICAL DATA:  Shortness of breath and productive cough with weakness 1 week. EXAM: CHEST - 2 VIEW COMPARISON:  04/18/2020 FINDINGS: Lungs are adequately inflated without airspace consolidation or effusion. Cardiomediastinal silhouette and remainder of the exam is unchanged.  IMPRESSION: No active cardiopulmonary disease. Electronically Signed   By: Marin Olp M.D.   On: 07/18/2020 17:06   Assessment and Plan: Patient Active Problem List   Diagnosis Date Noted  . Cough 09/13/2020  . Bronchitis 07/15/2020  . Palpitations 07/15/2020  . Aortic atherosclerosis ([place]) 06/21/2020  . Heart failure ([place]) 06/21/2020  . Otalgia of both ears 05/27/2020  . Lobar pneumonia ([place])   . Type 2 diabetes mellitus with stage 4 chronic kidney disease, without long-term current use of insulin ([place]) 12/14/2019  . Elevated troponin   . Acidosis 10/30/2019  . Benign 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 10/30/2019  . Hyperkalemia 10/30/2019  . Secondary hyperparathyroidism of renal origin ([place]) 10/30/2019  . 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 Encounter for fitting and adjustment of hearing aid 06/16/2019  . Health maintenance alteration 06/16/2019  . Prostate cancer ([place]) 06/16/2019  . DM type 2 with diabetic peripheral neuropathy ([place]) 01/25/2019  . CKD (chronic kidney disease) stage 4, GFR 15-29 ml/min (HCC) 01/21/2019  . Gout 01/21/2019  . MR (mitral regurgitation) 01/21/2019  . COPD (chronic obstructive pulmonary disease) ([place]) 01/08/2019  . OSA (obstructive sleep apnea) 01/08/2019  . Chronic diastolic heart failure ([place]) 12/30/2018  . Polyp of sigmoid colon   . Benign neoplasm of ascending colon   . Hemorrhoids without complication   . Diverticulosis of large intestine  without diverticulitis   . Reflux esophagitis   . Lymphangiectasia   . Arteriovenous malformation of duodenum   . Symptomatic anemia   . SOB (shortness of breath)   . Iron deficiency anemia due to chronic blood loss   . Unstable angina ([place])   . Anemia of chronic renal failure, stage 4 (severe) ([place])   . Chest pain 11/14/2018  . Sciatica 11/12/2018  . CAD (coronary artery disease) 02/27/2018  . Sacroiliac joint pain 10/08/2017  . Pain in limb 09/24/2017  .  Neuropathy 07/24/2017  . NSTEMI (non-ST elevated myocardial infarction) ([place]) 05/06/2017  . PAD (peripheral artery disease) ([place]) 05/01/2017  . History of stroke 01/02/2017  . Barrett's esophagus 10/02/2016  . Carotid stenosis 10/02/2016  . Essential hypertension 10/02/2016  . Hyperlipidemia 10/02/2016  . Diabetes ([place]) 10/02/2016  . 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 Uncontrolled type 2 diabetes mellitus with hyperglycemia, with long-term current use of insulin ([place]) 04/10/2016  . TIA (transient ischemic attack) 10/08/2015    1. Obstructive chronic bronchitis with exacerbation ([place]) Will treat with longer course of azithromycin-unable to use levaquin due to history of tendon rupture Low dose prednisone taper--has also had reaction to prednisone in past but will use low dose extended taper - predniSONE (DELTASONE) 10 MG tablet; Take 2 tablets daily for 1 week followed by 1 tablet daily for 1 week.  Dispense: 21 tablet; Refill: 0 - azithromycin (ZITHROMAX) 250 MG tablet; Take 1 tablet daily for 14 days  Dispense: 14 tablet; Refill: 0  2. Chronic systolic congestive heart failure (HCC) No evidence of fluid overload at this time, continue with current therapy and cardiology follow-up as indicated  3. Essential hypertension BP and HR lower than his normal baseline--advised to stop amlodipine at this time and to discuss with PCP or cardiologist on managing his medications  4. Cough CXR negative for acute processes--discussed findings with him in office - DG Chest 2 View; Future  General Counseling: I have discussed the findings of the evaluation and examination with Marland Kitchen.  I have also discussed any further diagnostic evaluation thatmay be needed or ordered today. Aaro verbalizes understanding of the findings of todays visit. We also reviewed his medications today and discussed drug interactions and side effects including but not limited excessive  drowsiness and altered mental states. We also discussed that there is always a risk not just to him but also people around him. he has been encouraged to call the office with any questions or concerns that should arise related to todays visit.  Orders Placed This Encounter  Procedures  . DG Chest 2 View    [id number]    Standing Status:   Future    Number of Occurrences:   1    Standing Expiration Date:   09/22/2021    Order Specific Question:   Reason for Exam (SYMPTOM  OR DIAGNOSIS REQUIRED)    Answer:   cough    Order Specific Question:   Preferred imaging location?    Answer:   [facility]     Time spent: 87  I have personally obtained a history, examined the patient, evaluated laboratory and imaging results, formulated the assessment and plan and placed orders. This patient was seen by Casey Burkitt AGNP-C in Collaboration with Dr. Devona Konig as a part of collaborative care agreement.    Allyne Gee, MD [facility] Pulmonary and Critical Care Sleep medicine

## 2020-09-23 ENCOUNTER — Other Ambulatory Visit: Payer: Self-pay

## 2020-09-23 ENCOUNTER — Telehealth: Payer: Self-pay

## 2020-09-23 MED ORDER — NYSTATIN 100000 UNIT/ML MT SUSP: 5.0000 mL | Freq: Four times a day (QID) | OROMUCOSAL | 0 refills | Status: DC

## 2020-09-23 NOTE — Telephone Encounter (Signed)
Faxed [facility] 2nd time for new cpap machine order. Shiryl Ruddy

## 2020-09-24 ENCOUNTER — Encounter: Payer: Self-pay | Admitting: Internal Medicine

## 2020-09-26 ENCOUNTER — Other Ambulatory Visit: Payer: Self-pay

## 2020-09-26 MED ORDER — NYSTATIN 100000 UNIT/ML MT SUSP: 5.0000 mL | Freq: Four times a day (QID) | OROMUCOSAL | 0 refills | Status: DC

## 2020-10-03 ENCOUNTER — Other Ambulatory Visit: Payer: Self-pay

## 2020-10-03 DIAGNOSIS — E119 Type 2 diabetes mellitus without complications: Secondary | ICD-10-CM | POA: Diagnosis not present

## 2020-10-03 DIAGNOSIS — J441 Chronic obstructive pulmonary disease with (acute) exacerbation: Secondary | ICD-10-CM

## 2020-10-03 DIAGNOSIS — I6529 Occlusion and stenosis of unspecified carotid artery: Secondary | ICD-10-CM | POA: Diagnosis not present

## 2020-10-03 DIAGNOSIS — I10 Essential (primary) hypertension: Secondary | ICD-10-CM | POA: Diagnosis not present

## 2020-10-03 DIAGNOSIS — N184 Chronic kidney disease, stage 4 (severe): Secondary | ICD-10-CM | POA: Diagnosis not present

## 2020-10-03 DIAGNOSIS — I340 Nonrheumatic mitral (valve) insufficiency: Secondary | ICD-10-CM | POA: Diagnosis not present

## 2020-10-03 DIAGNOSIS — R0602 Shortness of breath: Secondary | ICD-10-CM | POA: Diagnosis not present

## 2020-10-03 DIAGNOSIS — I214 Non-ST elevation (NSTEMI) myocardial infarction: Secondary | ICD-10-CM | POA: Diagnosis not present

## 2020-10-03 DIAGNOSIS — I739 Peripheral vascular disease, unspecified: Secondary | ICD-10-CM | POA: Diagnosis not present

## 2020-10-03 DIAGNOSIS — I2510 Atherosclerotic heart disease of native coronary artery without angina pectoris: Secondary | ICD-10-CM | POA: Diagnosis not present

## 2020-10-03 DIAGNOSIS — G4733 Obstructive sleep apnea (adult) (pediatric): Secondary | ICD-10-CM | POA: Diagnosis not present

## 2020-10-03 DIAGNOSIS — E782 Mixed hyperlipidemia: Secondary | ICD-10-CM | POA: Diagnosis not present

## 2020-10-03 DIAGNOSIS — I5022 Chronic systolic (congestive) heart failure: Secondary | ICD-10-CM

## 2020-10-03 DIAGNOSIS — J449 Chronic obstructive pulmonary disease, unspecified: Secondary | ICD-10-CM | POA: Diagnosis not present

## 2020-10-06 ENCOUNTER — Other Ambulatory Visit: Payer: Self-pay

## 2020-10-06 ENCOUNTER — Encounter: Payer: Self-pay | Admitting: Internal Medicine

## 2020-10-06 ENCOUNTER — Ambulatory Visit (INDEPENDENT_AMBULATORY_CARE_PROVIDER_SITE_OTHER): Payer: Medicare Other | Admitting: Internal Medicine

## 2020-10-06 DIAGNOSIS — I6523 Occlusion and stenosis of bilateral carotid arteries: Secondary | ICD-10-CM | POA: Diagnosis not present

## 2020-10-06 DIAGNOSIS — R0602 Shortness of breath: Secondary | ICD-10-CM

## 2020-10-06 DIAGNOSIS — G4733 Obstructive sleep apnea (adult) (pediatric): Secondary | ICD-10-CM

## 2020-10-06 DIAGNOSIS — J449 Chronic obstructive pulmonary disease, unspecified: Secondary | ICD-10-CM

## 2020-10-06 DIAGNOSIS — I5022 Chronic systolic (congestive) heart failure: Secondary | ICD-10-CM

## 2020-10-06 DIAGNOSIS — Z9989 Dependence on other enabling machines and devices: Secondary | ICD-10-CM

## 2020-10-06 DIAGNOSIS — J4489 Other specified chronic obstructive pulmonary disease: Secondary | ICD-10-CM

## 2020-10-06 NOTE — Progress Notes (Signed)
[facility] [place], [place] [address]  Pulmonary Sleep Medicine   Office Visit Note  Patient Name: Mike Decker DOB: 1941-10-05 MRN [id number]  Date of Service: 10/10/2020  Complaints/HPI: Patient is here for routine pulmonary follow-up Last seen 10/14 for acute exacerbation of chronic bronchitis-completed 14 days of azithromycin as well as low dose prednisone taper, no reports of complications from medications Today he says he is feeling much better, coughing, shortness of breath as well as wheezing has greatly improved He was also able to see his cardiologist and there were several adjustments made to his medications  ROS  General: (-) fever, (-) chills, (-) night sweats, (-) weakness Skin: (-) rashes, (-) itching,. Eyes: (-) visual changes, (-) redness, (-) itching. Nose and Sinuses: (-) nasal stuffiness or itchiness, (-) postnasal drip, (-) nosebleeds, (-) sinus trouble. Mouth and Throat: (-) sore throat, (-) hoarseness. Neck: (-) swollen glands, (-) enlarged thyroid, (-) neck pain. Respiratory: - cough, (-) bloody sputum, - shortness of breath, - wheezing. Cardiovascular: - ankle swelling, (-) chest pain. Lymphatic: (-) lymph node enlargement. Neurologic: (-) numbness, (-) tingling. Psychiatric: (-) anxiety, (-) depression   Current Medication: Outpatient Encounter Medications as of 10/06/2020  Medication Sig Note  . acetaminophen (TYLENOL) 500 MG tablet Take 1,000 mg by mouth every 6 (six) hours as needed for mild pain, moderate pain or headache.   . allopurinol (ZYLOPRIM) 100 MG tablet Take 100 mg by mouth daily.    Marland Kitchen amLODipine (NORVASC) 5 MG tablet Take 2.5 mg by mouth 2 (two) times daily.    Marland Kitchen aspirin EC 81 MG EC tablet Take 1 tablet (81 mg total) by mouth daily.   Marland Kitchen azelastine (ASTELIN) 0.1 % nasal spray USE 1 TO 2 SPRAYS IN EACH NOSTRIL TWICE A DAY   . azithromycin (ZITHROMAX) 250 MG tablet Take 1 tablet daily for 14 days   .  budesonide-formoterol (SYMBICORT) 160-4.5 MCG/ACT inhaler Inhale 2 puffs into the lungs 2 (two) times daily as needed.    . cetirizine (ZYRTEC) 5 MG tablet Take 5 mg by mouth daily as needed for allergies or rhinitis.    . citalopram (CELEXA) 10 MG tablet Take 10 mg by mouth daily.   . clopidogrel (PLAVIX) 75 MG tablet Take 1 tablet (75 mg total) by mouth daily.   . colchicine 0.6 MG tablet Take 3 tablets (1.8 mg total) by mouth daily as needed (gout flares).   Marland Kitchen dextromethorphan-guaiFENesin (MUCINEX DM) 30-600 MG 12hr tablet Take 1 tablet by mouth 2 (two) times daily as needed for cough.   . EPINEPHrine (EPI-PEN) 0.3 mg/0.3 mL SOAJ injection Inject into the muscle as needed (anaphylaxis).    . ferrous sulfate 325 (65 FE) MG EC tablet Take 1 tablet (325 mg total) by mouth daily.   . fluticasone (FLONASE) 50 MCG/ACT nasal spray USE 1 SPRAY IN EACH NOSTRIL EVERY DAY   . folic acid (V-R FOLIC ACID) 703 MCG tablet Take 1 tablet (400 mcg total) by mouth daily.   . furosemide (LASIX) 20 MG tablet Take 2 tablets (40 mg total) by mouth daily. (Patient taking differently: Take 60 mg by mouth 2 (two) times daily. Takes 40 mg in morning and if needed takes 40 mg in the evening)   . hydrALAZINE (APRESOLINE) 25 MG tablet Take 1 tablet (25 mg total) by mouth every 8 (eight) hours. (Patient taking differently: Take 25 mg by mouth 2 (two) times daily. )   . insulin aspart (NOVOLOG) 100 UNIT/ML  FlexPen 14 UNITS IN AM, 18 UNITS AT LUNCH, 24 UNITS AT DINNER   . insulin glargine (LANTUS) 100 UNIT/ML injection Inject 0.35 mLs (35 Units total) into the skin at bedtime. This is a decrease from your previous 45 units nightly.   Marland Kitchen ipratropium-albuterol (DUONEB) 0.5-2.5 (3) MG/3ML SOLN Take 3 mLs by nebulization every 4 (four) hours as needed.   . isosorbide mononitrate (IMDUR) 60 MG 24 hr tablet Take 120 mg by mouth daily.    . methocarbamol (ROBAXIN) 500 MG tablet Take 500 mg by mouth 2 (two) times daily.    . metoprolol  tartrate (LOPRESSOR) 50 MG tablet Take 50 mg by mouth 3 (three) times daily.    . montelukast (SINGULAIR) 10 MG tablet TAKE 1 TABLET BY MOUTH EVERYDAY AT BEDTIME   . nitroGLYCERIN (NITROSTAT) 0.4 MG SL tablet Place 0.4 mg under the tongue every 5 (five) minutes x 3 doses as needed for chest pain.    Marland Kitchen nystatin (MYCOSTATIN) 100000 UNIT/ML suspension Take 5 mLs (500,000 Units total) by mouth 4 (four) times daily. Swish and swallow 3 times daily for 10 days   . nystatin (MYCOSTATIN) 100000 UNIT/ML suspension Take 5 mLs (500,000 Units total) by mouth 4 (four) times daily.   . Omega-3 Fatty Acids (FISH OIL) 1000 MG CAPS Take 1 capsule by mouth daily.    . pantoprazole (PROTONIX) 40 MG tablet Take 1 tablet (40 mg total) by mouth 2 (two) times daily before a meal.   . predniSONE (DELTASONE) 10 MG tablet Take 2 tablets daily for 1 week followed by 1 tablet daily for 1 week.   . rosuvastatin (CRESTOR) 40 MG tablet Take 40 mg by mouth every evening.    . sodium bicarbonate 650 MG tablet Take 650 mg by mouth 2 (two) times daily.   . vitamin B-12 (CYANOCOBALAMIN) 1000 MCG tablet Take 1 tablet (1,000 mcg total) by mouth daily.   . vitamin C (ASCORBIC ACID) 250 MG tablet Take 250 mg by mouth daily.   . Vitamin D, Ergocalciferol, (DRISDOL) 50000 units CAPS capsule Take 50,000 Units by mouth every 30 (thirty) days.  12/14/2019: 23rd   . zinc gluconate 50 MG tablet Take 50 mg by mouth daily.    No facility-administered encounter medications on file as of 10/06/2020.    Surgical History: Past Surgical History:  Procedure Laterality Date  . CATARACT EXTRACTION  2011, 2012  . COLONOSCOPY N/A 12/14/2018   Procedure: COLONOSCOPY;  Surgeon: Virgel Manifold, MD;  Location: [facility] ENDOSCOPY;  Service: Endoscopy;  Laterality: N/A;  . CORONARY ANGIOPLASTY WITH STENT PLACEMENT  2012  . CORONARY STENT INTERVENTION N/A 12/29/2019   Procedure: CORONARY STENT INTERVENTION;  Surgeon: Nelva Bush, MD;  Location: [place] [facility];  Service: Cardiovascular;  Laterality: N/A;  . ESOPHAGOGASTRODUODENOSCOPY N/A 12/14/2018   Procedure: ESOPHAGOGASTRODUODENOSCOPY (EGD);  Surgeon: Virgel Manifold, MD;  Location: [facility] ENDOSCOPY;  Service: Endoscopy;  Laterality: N/A;  . ESOPHAGOGASTRODUODENOSCOPY (EGD) WITH PROPOFOL N/A 06/01/2016   Procedure: ESOPHAGOGASTRODUODENOSCOPY (EGD) WITH PROPOFOL;  Surgeon: Manya Silvas, MD;  Location: [facility] ENDOSCOPY;  Service: Endoscopy;  Laterality: N/A;  . HERNIA REPAIR     x2  . INTRAOCULAR LENS INSERTION    . LEFT HEART CATH AND CORONARY ANGIOGRAPHY N/A 05/09/2017   Procedure: Left Heart Cath and Coronary Angiography;  Surgeon: Yolonda Kida, MD;  Location: [place] [facility];  Service: Cardiovascular;  Laterality: N/A;  . LEFT HEART CATH AND CORONARY ANGIOGRAPHY N/A 12/08/2018   Procedure: LEFT  HEART CATH AND CORONARY ANGIOGRAPHY and possible PCI and stent;  Surgeon: Yolonda Kida, MD;  Location: [place] [facility];  Service: Cardiovascular;  Laterality: N/A;  . LEFT HEART CATH AND CORONARY ANGIOGRAPHY N/A 12/29/2019   Procedure: LEFT HEART CATH AND CORONARY ANGIOGRAPHY;  Surgeon: Nelva Bush, MD;  Location: [place] [facility];  Service: Cardiovascular;  Laterality: N/A;  . NOSE SURGERY     submucous resection  . PROSTATE SURGERY  2002  . ROTATOR CUFF REPAIR Right     Medical History: Past Medical History:  Diagnosis Date  . Anemia   . Arthritis   . Atrioventricular canal (AVC)    irregular heart beats  . Barrett esophagus   . Bronchiolitis   . Cancer ([place]) 2002   prostate, esphageal  . Chronic diastolic CHF (congestive heart failure) ([place])   . Colon polyp   . Diabetes mellitus without complication ([place])   . Diverticulosis   . Gout   . Heart disease   . Hemangioma    liver  . Hyperlipidemia   . Hypertension   . Myocardial infarct ([place])   . Ocular hypertension   . Peripheral vascular disease ([place])   . Skin cancer   . Skin melanoma  ([place])   . Sleep apnea   . Vitreoretinal degeneration     Family History: Family History  Problem Relation Age of Onset  . Arthritis Mother   . Stroke Maternal Grandfather   . Breast cancer Neg Hx     Social History: Social History   Socioeconomic History  . Marital status: Married    Spouse name: Not on file  . Number of children: Not on file  . Years of education: Not on file  . Highest education level: Not on file  Occupational History  . Not on file  Tobacco Use  . Smoking status: Former Smoker    Years: 20.00    Types: Cigarettes    Quit date: 12/10/1976    Years since quitting: 43.8  . Smokeless tobacco: Never Used  Substance and Sexual Activity  . Alcohol use: No  . Drug use: No  . Sexual activity: Not on file  Other Topics Concern  . Not on file  Social History Narrative  . Not on file   Social Determinants of Health   Financial Resource Strain:   . Difficulty of Paying Living Expenses: Not on file  Food Insecurity:   . Worried About Charity fundraiser in the Last Year: Not on file  . Ran Out of Food in the Last Year: Not on file  Transportation Needs:   . Lack of Transportation (Medical): Not on file  . Lack of Transportation (Non-Medical): Not on file  Physical Activity:   . Days of Exercise per Week: Not on file  . Minutes of Exercise per Session: Not on file  Stress:   . Feeling of Stress : Not on file  Social Connections:   . Frequency of Communication with Friends and Family: Not on file  . Frequency of Social Gatherings with Friends and Family: Not on file  . Attends Religious Services: Not on file  . Active Member of Clubs or Organizations: Not on file  . Attends Archivist Meetings: Not on file  . Marital Status: Not on file  Intimate Partner Violence:   . Fear of Current or Ex-Partner: Not on file  . Emotionally Abused: Not on file  . Physically Abused: Not on file  . Sexually  Abused: Not on file    Vital Signs: Blood  pressure (!) 150/66, pulse 62, temperature 97.8 F (36.6 C), resp. rate 16, height 5\' 6"  (1.676 m), weight 205 lb 6.4 oz (93.2 kg), SpO2 98 %.  Examination: General Appearance: The patient is well-developed, well-nourished, and in no distress. Skin: Gross inspection of skin unremarkable. Head: normocephalic, no gross deformities. Eyes: no gross deformities noted. ENT: ears appear grossly normal no exudates. Neck: Supple. No thyromegaly. No LAD. Respiratory: Diminished wheezing, most prominent in bilateral bases, no rhonchi or rales noted. Cardiovascular: Normal S1 and S2 without murmur or rub. Extremities: No cyanosis. pulses are equal. Neurologic: Alert and oriented. No involuntary movements.  LABS: Recent Results (from the past 2160 hour(s))  POC COVID-19     Status: Normal   Collection Time: 09/13/20  2:41 PM  Result Value Ref Range   SARS Coronavirus 2 Ag Negative Negative    Radiology: DG Chest 2 View  Result Date: 09/22/2020 CLINICAL DATA:  Cough. EXAM: CHEST - 2 VIEW COMPARISON:  July 18, 2020. FINDINGS: The heart size and mediastinal contours are within normal limits. Both lungs are clear. The visualized skeletal structures are unremarkable. IMPRESSION: No active cardiopulmonary disease. Electronically Signed   By: Marijo Conception M.D.   On: 09/22/2020 11:50    No results found.  DG Chest 2 View  Result Date: 09/22/2020 CLINICAL DATA:  Cough. EXAM: CHEST - 2 VIEW COMPARISON:  July 18, 2020. FINDINGS: The heart size and mediastinal contours are within normal limits. Both lungs are clear. The visualized skeletal structures are unremarkable. IMPRESSION: No active cardiopulmonary disease. Electronically Signed   By: Marijo Conception M.D.   On: 09/22/2020 11:50      Assessment and Plan: Patient Active Problem List   Diagnosis Date Noted  . Cough 09/13/2020  . Bronchitis 07/15/2020  . Palpitations 07/15/2020  . Aortic atherosclerosis ([place]) 06/21/2020  . Heart failure  ([place]) 06/21/2020  . Otalgia of both ears 05/27/2020  . Lobar pneumonia ([place])   . Type 2 diabetes mellitus with stage 4 chronic kidney disease, without long-term current use of insulin ([place]) 12/14/2019  . Elevated troponin   . Acidosis 10/30/2019  . Benign 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 10/30/2019  . Hyperkalemia 10/30/2019  . Secondary hyperparathyroidism of renal origin ([place]) 10/30/2019  . 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 Encounter for fitting and adjustment of hearing aid 06/16/2019  . Health maintenance alteration 06/16/2019  . Prostate cancer ([place]) 06/16/2019  . DM type 2 with diabetic peripheral neuropathy ([place]) 01/25/2019  . CKD (chronic kidney disease) stage 4, GFR 15-29 ml/min (HCC) 01/21/2019  . Gout 01/21/2019  . MR (mitral regurgitation) 01/21/2019  . COPD (chronic obstructive pulmonary disease) ([place]) 01/08/2019  . OSA (obstructive sleep apnea) 01/08/2019  . Chronic diastolic heart failure ([place]) 12/30/2018  . Polyp of sigmoid colon   . Benign neoplasm of ascending colon   . Hemorrhoids without complication   . Diverticulosis of large intestine without diverticulitis   . Reflux esophagitis   . Lymphangiectasia   . Arteriovenous malformation of duodenum   . Symptomatic anemia   . SOB (shortness of breath)   . Iron deficiency anemia due to chronic blood loss   . Unstable angina ([place])   . Anemia of chronic renal failure, stage 4 (severe) ([place])   . Chest pain 11/14/2018  . Sciatica 11/12/2018  . CAD (coronary artery disease) 02/27/2018  . Sacroiliac joint pain 10/08/2017  . Pain  in limb 09/24/2017  . Neuropathy 07/24/2017  . NSTEMI (non-ST elevated myocardial infarction) ([place]) 05/06/2017  . PAD (peripheral artery disease) ([place]) 05/01/2017  . History of stroke 01/02/2017  . Barrett's esophagus 10/02/2016  . Carotid stenosis 10/02/2016  . Essential hypertension 10/02/2016  . Hyperlipidemia 10/02/2016  . Diabetes ([place]) 10/02/2016   . 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 Uncontrolled type 2 diabetes mellitus with hyperglycemia, with long-term current use of insulin ([place]) 04/10/2016  . TIA (transient ischemic attack) 10/08/2015    1. Obstructive chronic bronchitis without exacerbation (HCC) Acute exacerbation improving at this time, advised to continue with maintenance therapy Will repeat CT chest--previous CT 12/2018--patchy ground glass opacity and interlobular septal thickening in dependent lower lungs, mild patchy subpleural reticulation with minimal traction bronchiolectasis - CT Chest High Resolution; Future - CT Chest High Resolution  2. OSA on CPAP Continue with nightly PAP use  3. Chronic systolic congestive heart failure ([place]) Recently evaluated and treatment adjusted by cardiology--stable at this time  General Counseling: I have discussed the findings of the evaluation and examination with Marland Kitchen.  I have also discussed any further diagnostic evaluation thatmay be needed or ordered today. Mike Decker verbalizes understanding of the findings of todays visit. We also reviewed his medications today and discussed drug interactions and side effects including but not limited excessive drowsiness and altered mental states. We also discussed that there is always a risk not just to him but also people around him. he has been encouraged to call the office with any questions or concerns that should arise related to todays visit.  Orders Placed This Encounter  Procedures  . CT Chest High Resolution    Standing Status:   Future    Number of Occurrences:   1    Standing Expiration Date:   10/06/2021    Order Specific Question:   Preferred imaging location?    Answer:   [facility]     Time spent: 44  I have personally obtained a history, examined the patient, evaluated laboratory and imaging results, formulated the assessment and plan and placed orders. This patient was seen by  Casey Burkitt AGNP-C in Collaboration with Dr. Devona Konig as a part of collaborative care agreement.    Allyne Gee, MD [facility] Pulmonary and Critical Care Sleep medicine

## 2020-10-10 ENCOUNTER — Encounter: Payer: Self-pay | Admitting: Internal Medicine

## 2020-10-10 NOTE — Patient Instructions (Signed)
Chronic Bronchitis, Adult Chronic bronchitis is long-lasting inflammation of the tubes that carry air into your lungs (bronchial tubes). This is inflammation that occurs:  On most days of the week.  For at least three months at a time.  Over a period of two years in a row. When the bronchial tubes are inflamed, they start to produce mucus. The inflammation and buildup of mucus make it more difficult to breathe. Chronic bronchitis is usually a permanent problem. It is one type of chronic obstructive pulmonary disease (COPD). People with chronic bronchitis are more likely to get frequent colds or respiratory infections. What are the causes? Chronic bronchitis most often occurs in people who:  Have chronic, severe asthma.  Have a history of smoking.  Have asthma and smoke.  Have certain lung diseases.  Have had long-term exposure to certain irritating fumes or chemicals. What are the signs or symptoms? Symptoms of chronic bronchitis may include:  A cough that brings up mucus (productive cough).  Shortness of breath.  Loud breathing (wheezing).  Chest discomfort.  Frequent (recurring) colds or respiratory infections. Certain things can trigger chronic bronchitis symptoms or make them worse, such as:  Infections.  Stopping certain medicines.  Smoking.  Exposure to chemicals. How is this diagnosed? This condition may be diagnosed based on:  Your symptoms and medical history.  A physical exam.  A chest X-ray.  Lung (pulmonary) function tests. How is this treated? There is no cure for chronic bronchitis, but treatment can help control your symptoms. Treatment may include:  Using a cool mist vaporizer or humidifier to make it easier to breathe.  Drinking more fluids. Drinking more makes your mucus thinner, which may make it easier to breathe.  Lifestyle changes, such as eating a healthier diet and getting more exercise.  Medicines, such as: ? Inhalers to improve  air flow in and out of your lungs. ? Antibiotics to treat any bacterial infections you have, such as:  Lung infection (pneumonia).  Sinus infection.  A sudden, severe (acute) episode of bronchitis.  Oxygen therapy.  Preventing infections by keeping up to date on vaccinations, including the pneumonia and flu vaccines.  Pulmonary rehabilitation. This is a program that helps you manage your breathing problems and improve your quality of life. It may last for up to 4-12 weeks and may include exercise programs, education, counseling, and treatment support. Follow these instructions at home: Medicines  Take over-the-counter and prescription medicines only as told by your health care provider.  If you were prescribed an antibiotic medicine, take it as told by your health care provider. Do not stop taking the antibiotic even if you start to feel better. Preventing infections  Get vaccinations as told by your health care provider. Make sure you get a flu shot (influenza vaccine) every year.  Wash your hands often with soap and water. If soap and water are not available, use hand sanitizer.  Avoid contact with people who have symptoms of a cold or the flu. Managing symptoms   Do not smoke, and avoid secondhand smoke. Exposure to cigarette smoke or irritating chemicals will make bronchitis worse. If you smoke and you need help quitting, ask your health care provider. Quitting smoking will help your lungs heal faster.  Use an inhaler, cool mist vaporizer, or humidifier as told by your health care provider.  Avoid pollen, dust, animal dander, molds, smoke, and other things that cause shortness of breath or wheezing attacks.  Use oxygen therapy at home as directed. Follow   instructions from your health care provider about how to use oxygen safely and take precautions to prevent fire. Make sure you never smoke while using oxygen or allow others to smoke in your home.  Do not wait to get medical  care if you have any concerning symptoms or trouble breathing. Waiting could cause permanent injury and may be life threatening. General instructions  Talk with your health care provider about what activities are safe for you and about possible exercise routines. Regular exercise is very important to help you feel better.  Drink enough fluids to keep your urine pale yellow.  Keep all follow-up visits as told by your health care provider. This is important. Contact a health care provider if:  You have coughing or shortness of breath that gets worse.  You have muscle aches.  You have chest pain.  Your mucus seems to get thicker.  Your mucus changes from clear or white to yellow, green, gray, or bloody. Get help right away if:  Your usual medicines do not stop your wheezing.  You have severe difficulty breathing. These symptoms may represent a serious problem that is an emergency. Do not wait to see if the symptoms will go away. Get medical help right away. Call your local emergency services (911 in the [place] Do not drive yourself to the hospital. Summary  Chronic bronchitis is long-lasting inflammation of the tubes that carry air into your lungs (bronchial tubes).  Chronic bronchitis is usually a permanent problem. It is one type of chronic obstructive pulmonary disease (COPD).  There is no cure for chronic bronchitis, but treatment can help control your symptoms.  Do not smoke, and avoid secondhand smoke. Exposure to cigarette smoke or irritating chemicals will make bronchitis worse. This information is not intended to replace advice given to you by your health care provider. Make sure you discuss any questions you have with your health care provider. Document Revised: 09/18/2018 Document Reviewed: 10/16/2017 Elsevier Patient Education  2020 [employer] Inc[employer]  

## 2020-10-12 ENCOUNTER — Ambulatory Visit: Payer: Medicare Other | Admitting: Internal Medicine

## 2020-10-14 ENCOUNTER — Other Ambulatory Visit: Payer: Self-pay | Admitting: Oncology

## 2020-10-17 ENCOUNTER — Ambulatory Visit: Payer: Medicare Other | Admitting: Adult Health

## 2020-10-18 ENCOUNTER — Ambulatory Visit: Payer: Medicare Other

## 2020-10-19 ENCOUNTER — Ambulatory Visit: Payer: Medicare Other | Admitting: Family Medicine

## 2020-10-20 ENCOUNTER — Ambulatory Visit
Admission: RE | Admit: 2020-10-20 | Discharge: 2020-10-20 | Disposition: A | Discharge status: Home / Self Care | Payer: Medicare Other | Source: Ambulatory Visit | Attending: Hospice and Palliative Medicine | Admitting: Hospice and Palliative Medicine

## 2020-10-20 ENCOUNTER — Other Ambulatory Visit: Payer: Self-pay

## 2020-10-20 DIAGNOSIS — K449 Diaphragmatic hernia without obstruction or gangrene: Secondary | ICD-10-CM | POA: Diagnosis not present

## 2020-10-20 DIAGNOSIS — R0600 Dyspnea, unspecified: Secondary | ICD-10-CM | POA: Diagnosis not present

## 2020-10-20 DIAGNOSIS — J479 Bronchiectasis, uncomplicated: Secondary | ICD-10-CM | POA: Diagnosis not present

## 2020-10-20 DIAGNOSIS — J449 Chronic obstructive pulmonary disease, unspecified: Secondary | ICD-10-CM | POA: Insufficient documentation

## 2020-10-20 DIAGNOSIS — I2510 Atherosclerotic heart disease of native coronary artery without angina pectoris: Secondary | ICD-10-CM | POA: Diagnosis not present

## 2020-10-31 DIAGNOSIS — E119 Type 2 diabetes mellitus without complications: Secondary | ICD-10-CM | POA: Diagnosis not present

## 2020-10-31 DIAGNOSIS — H16223 Keratoconjunctivitis sicca, not specified as Sjogren's, bilateral: Secondary | ICD-10-CM | POA: Diagnosis not present

## 2020-11-01 ENCOUNTER — Ambulatory Visit (INDEPENDENT_AMBULATORY_CARE_PROVIDER_SITE_OTHER): Payer: Medicare Other | Admitting: Internal Medicine

## 2020-11-01 ENCOUNTER — Other Ambulatory Visit: Payer: Self-pay

## 2020-11-01 ENCOUNTER — Encounter: Payer: Self-pay | Admitting: Internal Medicine

## 2020-11-01 VITALS — BP 142/68 | HR 71 | Ht 67.0 in | Wt 204.4 lb

## 2020-11-01 DIAGNOSIS — R0602 Shortness of breath: Secondary | ICD-10-CM | POA: Diagnosis not present

## 2020-11-01 DIAGNOSIS — E1122 Type 2 diabetes mellitus with diabetic chronic kidney disease: Secondary | ICD-10-CM

## 2020-11-01 DIAGNOSIS — R300 Dysuria: Secondary | ICD-10-CM

## 2020-11-01 DIAGNOSIS — N390 Urinary tract infection, site not specified: Secondary | ICD-10-CM

## 2020-11-01 DIAGNOSIS — N184 Chronic kidney disease, stage 4 (severe): Secondary | ICD-10-CM | POA: Diagnosis not present

## 2020-11-01 DIAGNOSIS — I6523 Occlusion and stenosis of bilateral carotid arteries: Secondary | ICD-10-CM | POA: Diagnosis not present

## 2020-11-01 DIAGNOSIS — J449 Chronic obstructive pulmonary disease, unspecified: Secondary | ICD-10-CM

## 2020-11-01 LAB — POCT URINALYSIS DIPSTICK
Blood, UA: NEGATIVE
Glucose, UA: NEGATIVE
Ketones, UA: NEGATIVE
Nitrite, UA: POSITIVE
Protein, UA: POSITIVE — AB
Spec Grav, UA: 1.02 (ref 1.010–1.025)
Urobilinogen, UA: 2 E.U./dL — AB
pH, UA: 5 (ref 5.0–8.0)

## 2020-11-01 MED ORDER — DOXYCYCLINE HYCLATE 100 MG PO TABS: 100.0000 mg | ORAL_TABLET | Freq: Two times a day (BID) | ORAL | 0 refills | Status: DC

## 2020-11-01 NOTE — Assessment & Plan Note (Signed)
Patient is started on doxycycline 1 mg p.o. twice a day for 10 days.  He was advised to call Dr.Wolf.

## 2020-11-01 NOTE — Assessment & Plan Note (Signed)
Diabetes is stable on the present regimen.

## 2020-11-01 NOTE — Progress Notes (Signed)
Established Patient Office Visit  Subjective:  Patient ID: Mike Decker, male    DOB: November 14, 1941  Age: 79 y.o. 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, esphageal   Chronic diastolic CHF (congestive heart failure) (HCC)    Colon polyp    Diabetes mellitus without complication ([place])    Diverticulosis    Gout    Heart disease    Hemangioma    liver   Hyperlipidemia    Hypertension    Myocardial infarct [facility])    Ocular hypertension    Peripheral vascular disease ([place])    Skin cancer    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[place])    Sleep apnea    Vitreoretinal degeneration     Past Surgical History:  Procedure Laterality Date   CATARACT EXTRACTION  2011, 2012   COLONOSCOPY N/A 12/14/2018   Procedure: COLONOSCOPY;  Surgeon: Virgel Manifold, MD;  Location: [facility] ENDOSCOPY;  Service: Endoscopy;  Laterality: N/A;   CORONARY ANGIOPLASTY WITH STENT PLACEMENT  2012   CORONARY STENT INTERVENTION N/A 12/29/2019   Procedure: CORONARY STENT INTERVENTION;  Surgeon: Nelva Bush, MD;  Location: [place] [facility];  Service: Cardiovascular;  Laterality: N/A;   ESOPHAGOGASTRODUODENOSCOPY N/A 12/14/2018   Procedure: ESOPHAGOGASTRODUODENOSCOPY (EGD);  Surgeon: Virgel Manifold, MD;  Location: [facility] ENDOSCOPY;  Service: Endoscopy;  Laterality: N/A;   ESOPHAGOGASTRODUODENOSCOPY (EGD) WITH PROPOFOL N/A 06/01/2016   Procedure: ESOPHAGOGASTRODUODENOSCOPY (EGD) WITH PROPOFOL;  Surgeon: Manya Silvas, MD;  Location: [facility] ENDOSCOPY;  Service: Endoscopy;  Laterality: N/A;   HERNIA REPAIR     x2   INTRAOCULAR LENS INSERTION     LEFT HEART CATH AND CORONARY ANGIOGRAPHY N/A  05/09/2017   Procedure: Left Heart Cath and Coronary Angiography;  Surgeon: Yolonda Kida, MD;  Location: [place] [facility];  Service: Cardiovascular;  Laterality: N/A;   LEFT HEART CATH AND CORONARY ANGIOGRAPHY N/A 12/08/2018   Procedure: LEFT HEART CATH AND CORONARY ANGIOGRAPHY and possible PCI and stent;  Surgeon: Yolonda Kida, MD;  Location: [place] [facility];  Service: Cardiovascular;  Laterality: N/A;   LEFT HEART CATH AND CORONARY ANGIOGRAPHY N/A 12/29/2019   Procedure: LEFT HEART CATH AND CORONARY ANGIOGRAPHY;  Surgeon: Nelva Bush, MD;  Location: [place] [facility];  Service: Cardiovascular;  Laterality: N/A;   NOSE SURGERY     submucous resection   PROSTATE SURGERY  2002   ROTATOR CUFF REPAIR Right     Family History  Problem Relation Age of Onset   Arthritis Mother    Stroke Maternal Grandfather    Breast cancer Neg Hx     Social History   Socioeconomic History   Marital status: Married    Spouse name: Not on file   Number of children: Not on file   Years of education: Not on file   Highest education level: Not on file  Occupational History   Not on file  Tobacco Use   Smoking status: Former Smoker    Years: 20.00    Types: Cigarettes    Quit date: 12/10/1976    Years since quitting: 43.9   Smokeless tobacco:  Never Used  Substance and Sexual Activity   Alcohol use: No   Drug use: No   Sexual activity: Not on file  Other Topics Concern   Not on file  Social History Narrative   Not on file   Social Determinants of Health   Financial Resource Strain:    Difficulty of Paying Living Expenses: Not on file  Food Insecurity:    Worried About [place] in the Last Year: Not on file   Ran Out of Food in the Last Year: Not on file  Transportation Needs:    Lack of Transportation (Medical): Not on file   Lack of Transportation (Non-Medical): Not on file  Physical Activity:    Days of Exercise per Week: Not  on file   Minutes of Exercise per Session: Not on file  Stress:    Feeling of Stress : Not on file  Social Connections:    Frequency of Communication with Friends and Family: Not on file   Frequency of Social Gatherings with Friends and Family: Not on file   Attends Religious Services: Not on file   Active Member of [place] or Organizations: Not on file   Attends Archivist Meetings: Not on file   Marital Status: Not on file  Intimate Partner Violence:    Fear of Current or Ex-Partner: Not on file   Emotionally Abused: Not on file   Physically Abused: Not on file   Sexually Abused: Not on file     Current Outpatient Medications:    acetaminophen (TYLENOL) 500 MG tablet, Take 1,000 mg by mouth every 6 (six) hours as needed for mild pain, moderate pain or headache., Disp: , Rfl:    allopurinol (ZYLOPRIM) 100 MG tablet, Take 100 mg by mouth daily. , Disp: , Rfl:    amLODipine (NORVASC) 5 MG tablet, Take 2.5 mg by mouth 2 (two) times daily. , Disp: , Rfl:    aspirin EC 81 MG EC tablet, Take 1 tablet (81 mg total) by mouth daily., Disp: 60 tablet, Rfl: 0   azelastine (ASTELIN) 0.1 % nasal spray, USE 1 TO 2 SPRAYS IN EACH NOSTRIL TWICE A DAY, Disp: , Rfl:    azithromycin (ZITHROMAX) 250 MG tablet, Take 1 tablet daily for 14 days, Disp: 14 tablet, Rfl: 0   budesonide-formoterol (SYMBICORT) 160-4.5 MCG/ACT inhaler, Inhale 2 puffs into the lungs 2 (two) times daily as needed. , Disp: , Rfl:    cetirizine (ZYRTEC) 5 MG tablet, Take 5 mg by mouth daily as needed for allergies or rhinitis. , Disp: , Rfl:    citalopram (CELEXA) 10 MG tablet, Take 10 mg by mouth daily., Disp: , Rfl:    clopidogrel (PLAVIX) 75 MG tablet, Take 1 tablet (75 mg total) by mouth daily., Disp: 30 tablet, Rfl: 11   colchicine 0.6 MG tablet, Take 3 tablets (1.8 mg total) by mouth daily as needed (gout flares)., Disp: , Rfl:    dextromethorphan-guaiFENesin (MUCINEX DM) 30-600 MG 12hr tablet,  Take 1 tablet by mouth 2 (two) times daily as needed for cough., Disp: , Rfl:    EPINEPHrine (EPI-PEN) 0.3 mg/0.3 mL SOAJ injection, Inject into the muscle as needed (anaphylaxis). , Disp: , Rfl:    ferrous sulfate 325 (65 FE) MG EC tablet, Take 1 tablet (325 mg total) by mouth daily., Disp: 90 tablet, Rfl: 3   fluticasone (FLONASE) 50 MCG/ACT nasal spray, USE 1 SPRAY IN EACH NOSTRIL EVERY DAY, Disp: 32 g, Rfl: 6  folic acid (FOLVITE) 275 MCG tablet, TAKE 1 TABLET (400 MCG TOTAL) BY MOUTH DAILY., Disp: 100 tablet, Rfl: 3   furosemide (LASIX) 20 MG tablet, Take 2 tablets (40 mg total) by mouth daily. (Patient taking differently: Take 60 mg by mouth 2 (two) times daily. Takes 40 mg in morning and if needed takes 40 mg in the evening), Disp: , Rfl:    hydrALAZINE (APRESOLINE) 25 MG tablet, Take 1 tablet (25 mg total) by mouth every 8 (eight) hours. (Patient taking differently: Take 25 mg by mouth 2 (two) times daily. ), Disp: 60 tablet, Rfl: 0   insulin aspart (NOVOLOG) 100 UNIT/ML FlexPen, 14 UNITS IN AM, 18 UNITS AT LUNCH, 24 UNITS AT DINNER, Disp: , Rfl:    insulin glargine (LANTUS) 100 UNIT/ML injection, Inject 0.35 mLs (35 Units total) into the skin at bedtime. This is a decrease from your previous 45 units nightly., Disp: 10 mL, Rfl: 11   ipratropium-albuterol (DUONEB) 0.5-2.5 (3) MG/3ML SOLN, Take 3 mLs by nebulization every 4 (four) hours as needed., Disp: 360 mL, Rfl: 0   isosorbide mononitrate (IMDUR) 60 MG 24 hr tablet, Take 120 mg by mouth daily. , Disp: , Rfl:    methocarbamol (ROBAXIN) 500 MG tablet, Take 500 mg by mouth 2 (two) times daily. , Disp: , Rfl: 0   metoprolol tartrate (LOPRESSOR) 50 MG tablet, Take 50 mg by mouth 3 (three) times daily. , Disp: , Rfl:    montelukast (SINGULAIR) 10 MG tablet, TAKE 1 TABLET BY MOUTH EVERYDAY AT BEDTIME, Disp: 90 tablet, Rfl: 1   nitroGLYCERIN (NITROSTAT) 0.4 MG SL tablet, Place 0.4 mg under the tongue every 5 (five) minutes x 3 doses  as needed for chest pain. , Disp: , Rfl:    nystatin (MYCOSTATIN) 100000 UNIT/ML suspension, Take 5 mLs (500,000 Units total) by mouth 4 (four) times daily. Swish and swallow 3 times daily for 10 days, Disp: 60 mL, Rfl: 0   nystatin (MYCOSTATIN) 100000 UNIT/ML suspension, Take 5 mLs (500,000 Units total) by mouth 4 (four) times daily., Disp: 60 mL, Rfl: 0   Omega-3 Fatty Acids (FISH OIL) 1000 MG CAPS, Take 1 capsule by mouth daily. , Disp: , Rfl:    pantoprazole (PROTONIX) 40 MG tablet, Take 1 tablet (40 mg total) by mouth 2 (two) times daily before a meal., Disp: , Rfl:    predniSONE (DELTASONE) 10 MG tablet, Take 2 tablets daily for 1 week followed by 1 tablet daily for 1 week., Disp: 21 tablet, Rfl: 0   rosuvastatin (CRESTOR) 40 MG tablet, Take 40 mg by mouth every evening. , Disp: , Rfl:    sodium bicarbonate 650 MG tablet, Take 650 mg by mouth 2 (two) times daily., Disp: , Rfl:    vitamin B-12 (CYANOCOBALAMIN) 1000 MCG tablet, Take 1 tablet (1,000 mcg total) by mouth daily., Disp: 90 tablet, Rfl: 3   vitamin C (ASCORBIC ACID) 250 MG tablet, Take 250 mg by mouth daily., Disp: , Rfl:    Vitamin D, Ergocalciferol, (DRISDOL) 50000 units CAPS capsule, Take 50,000 Units by mouth every 30 (thirty) days. , Disp: , Rfl: 1   zinc gluconate 50 MG tablet, Take 50 mg by mouth daily., Disp: , Rfl:    doxycycline (VIBRA-TABS) 100 MG tablet, Take 1 tablet (100 mg total) by mouth 2 (two) times daily., Disp: 20 tablet, Rfl: 0   Allergies  Allergen Reactions   Gabapentin     Other reaction(s): Other (See Comments) Tremors   Peanut-Containing Drug  Products Anaphylaxis   Penicillins Hives and Rash    Has patient had a PCN reaction causing immediate rash, facial/tongue/throat swelling, SOB or lightheadedness with hypotension: Yes Has patient had a PCN reaction causing severe rash involving mucus membranes or skin necrosis: No Has patient had a PCN reaction that required hospitalization No Has  patient had a PCN reaction occurring within the last 10 years: No If all of the above answers are "NO", then may proceed with Cephalosporin use.   Bee Venom Swelling   Influenza Vaccines Hives   Inh [Isoniazid] Hives   Kenalog [Triamcinolone Acetonide] Hives   Levaquin [Levofloxacin] Other (See Comments)    Tendon, ligament pain.    Nalfon [Fenoprofen Calcium] Hives   Naproxen    Peanut Oil    Nsaids Rash    Nalfon 600 Nalfon 600    ROS Review of Systems  Constitutional: Negative.  Negative for appetite change.  HENT: Negative.  Negative for ear pain and hearing loss.   Eyes: Negative.  Negative for redness.  Respiratory: Positive for shortness of breath. Negative for chest tightness and wheezing.   Cardiovascular: Negative.   Gastrointestinal: Negative.  Negative for anal bleeding.  Endocrine: Negative.   Genitourinary: Negative.   Musculoskeletal: Negative.  Negative for back pain.  Skin: Negative.   Allergic/Immunologic: Negative.   Neurological: Negative.   Hematological: Negative.   Psychiatric/Behavioral: Negative.  Negative for agitation and behavioral problems.  All other systems reviewed and are negative.     Objective:    Physical Exam Vitals reviewed.  Constitutional:      Appearance: Normal appearance. He is not ill-appearing or diaphoretic.  HENT:     Head: Atraumatic.     Mouth/Throat:     Mouth: Mucous membranes are moist.     Pharynx: No posterior oropharyngeal erythema.  Eyes:     Extraocular Movements: Extraocular movements intact.     Pupils: Pupils are equal, round, and reactive to light.  Neck:     Vascular: No carotid bruit.  Cardiovascular:     Rate and Rhythm: Normal rate and regular rhythm.     Pulses: Normal pulses.     Heart sounds: Normal heart sounds.  Pulmonary:     Effort: Pulmonary effort is normal.     Breath sounds: Rhonchi present. No wheezing or rales.  Abdominal:     General: Bowel sounds are normal.      Palpations: Abdomen is soft. There is no hepatomegaly, splenomegaly or mass.     Tenderness: There is no abdominal tenderness.     Hernia: No hernia is present.  Musculoskeletal:     Cervical back: Neck supple.     Right lower leg: No edema.     Left lower leg: No edema.  Skin:    Findings: No rash.  Neurological:     Mental Status: He is alert and oriented to person, place, and time.     Motor: No weakness.  Psychiatric:        Mood and Affect: Mood normal.        Behavior: Behavior normal.     BP (!) 142/68    Pulse 71    Ht 5\' 7"  (1.702 m)    Wt 204 lb 6.4 oz (92.7 kg)    BMI 32.01 kg/m  Wt Readings from Last 3 Encounters:  11/01/20 204 lb 6.4 oz (92.7 kg)  10/06/20 205 lb 6.4 oz (93.2 kg)  09/22/20 207 lb 9.6 oz (94.2 kg)  Health Maintenance Due  Topic Date Due   FOOT EXAM  Never done   OPHTHALMOLOGY EXAM  Never done   TETANUS/TDAP  12/10/2013   HEMOGLOBIN A1C  06/13/2020    There are no preventive care reminders to display for this patient.  Lab Results  Component Value Date   TSH 1.948 12/06/2018   Lab Results  Component Value Date   WBC 9.5 05/24/2020   HGB 12.4 (L) 05/24/2020   HCT 36.6 (L) 05/24/2020   MCV 89.5 05/24/2020   PLT 229 05/24/2020   Lab Results  Component Value Date   NA 141 07/07/2020   K 4.4 07/07/2020   CO2 24 07/07/2020   GLUCOSE 194 (H) 04/18/2020   BUN 52 (H) 04/18/2020   CREATININE 1.91 (H) 04/18/2020   BILITOT 0.5 04/18/2020   ALKPHOS 65 04/18/2020   AST 19 04/18/2020   ALT 12 04/18/2020   PROT 7.4 04/18/2020   ALBUMIN 4.3 04/18/2020   CALCIUM 8.9 04/18/2020   ANIONGAP 11 07/07/2020   Lab Results  Component Value Date   CHOL 115 12/29/2019   Lab Results  Component Value Date   HDL 31 (L) 12/29/2019   Lab Results  Component Value Date   LDLCALC 51 12/29/2019   Lab Results  Component Value Date   TRIG 163 (H) 12/29/2019   Lab Results  Component Value Date   CHOLHDL 3.7 12/29/2019   Lab Results    Component Value Date   HGBA1C 8.7 (H) 12/15/2019      Assessment & Plan:   Problem List Items Addressed This Visit      Respiratory   Chronic obstructive pulmonary disease ([place])    Patient is wheezing bilaterally.  His CT scan shows some fibrosis and chronic interstitial changes.  I did give him anora sample.  If it is continued to have wheezing he should consult with Dr. Humphrey Rolls        Endocrine   Type 2 diabetes mellitus with stage 4 chronic kidney disease, without long-term current use of insulin ([place])    Diabetes is stable on the present regimen.        Genitourinary   Urinary tract infection without hematuria - Primary    Patient is started on doxycycline 1 mg p.o. twice a day for 10 days.  He was advised to call Dr.Wolf.      Relevant Orders   POCT urinalysis dipstick (Completed)     Other   SOB (shortness of breath)    Patient was found to be wheezing,  Started on Anoro      Dysuria    Started on doxycycline 1 mg p.o. twice a day patient is allergic to Levaquin.         Meds ordered this encounter  Medications   doxycycline (VIBRA-TABS) 100 MG tablet    Sig: Take 1 tablet (100 mg total) by mouth 2 (two) times daily.    Dispense:  20 tablet    Refill:  0    Follow-up: No follow-ups on file.    Cletis Athens, MD

## 2020-11-01 NOTE — Assessment & Plan Note (Addendum)
Patient was found to be wheezing,  Started on Anoro

## 2020-11-01 NOTE — Assessment & Plan Note (Signed)
Started on doxycycline 1 mg p.o. twice a day patient is allergic to Levaquin.

## 2020-11-01 NOTE — Assessment & Plan Note (Addendum)
Patient is wheezing bilaterally.  His CT scan shows some fibrosis and chronic interstitial changes.  I did give him anora sample.  If it is continued to have wheezing he should consult with Dr. Humphrey Rolls

## 2020-11-09 ENCOUNTER — Ambulatory Visit: Payer: Medicare Other | Admitting: Oncology

## 2020-11-09 ENCOUNTER — Other Ambulatory Visit: Payer: Medicare Other

## 2020-11-11 ENCOUNTER — Other Ambulatory Visit: Payer: Self-pay | Admitting: Oncology

## 2020-11-11 ENCOUNTER — Other Ambulatory Visit: Payer: Self-pay | Admitting: *Deleted

## 2020-11-11 MED ORDER — FERROUS SULFATE 325 (65 FE) MG PO TBEC
325.0000 mg | DELAYED_RELEASE_TABLET | Freq: Every day | ORAL | 0 refills | Status: DC
Start: 2020-11-11 — End: 2021-02-06

## 2020-11-11 NOTE — Telephone Encounter (Signed)
Patient was advised at 05/25/20 lab results to continue oral iron supplement.  [facility].  Message left informing of med refill sent to pharmacy.

## 2020-11-11 NOTE — Telephone Encounter (Signed)
Wife called stating that we moved his appointment due to physician being off and that he will run out of his medicine Ferrous Sulfate before his appointment on the 14th. He only has 4 days of Iron left. Asking for enough to get trough to appointment on 14th. Please advise

## 2020-11-16 ENCOUNTER — Ambulatory Visit (INDEPENDENT_AMBULATORY_CARE_PROVIDER_SITE_OTHER): Payer: Medicare Other | Admitting: Internal Medicine

## 2020-11-16 ENCOUNTER — Other Ambulatory Visit: Payer: Self-pay | Admitting: Hospice and Palliative Medicine

## 2020-11-16 ENCOUNTER — Other Ambulatory Visit: Payer: Self-pay

## 2020-11-16 DIAGNOSIS — R0602 Shortness of breath: Secondary | ICD-10-CM

## 2020-11-16 DIAGNOSIS — J441 Chronic obstructive pulmonary disease with (acute) exacerbation: Secondary | ICD-10-CM

## 2020-11-16 LAB — PULMONARY FUNCTION TEST

## 2020-11-16 MED ORDER — HYDROCOD POLST-CPM POLST ER 10-8 MG/5ML PO SUER: 5.0000 mL | Freq: Two times a day (BID) | ORAL | 0 refills | Status: DC | PRN

## 2020-11-16 NOTE — Progress Notes (Signed)
Patient here for PFT today. Complaining of severe coughing, dry cough. Has been seen for same issue recently. CT chest reviewed. Possible having "silent" reflux. Scheduled for upper endoscopy with his GI at [facility] in January. He is complaining of difficulty swallowing at times, feels as though food and medications get stuck. Will follow up on results on upper endoscopy. Script for Tussionex sent today. Scheduled to be seen in office next week for pulmonary follow-up.

## 2020-11-17 DIAGNOSIS — E1142 Type 2 diabetes mellitus with diabetic polyneuropathy: Secondary | ICD-10-CM | POA: Diagnosis not present

## 2020-11-22 ENCOUNTER — Encounter: Payer: Self-pay | Admitting: Oncology

## 2020-11-22 ENCOUNTER — Inpatient Hospital Stay: Payer: Medicare Other | Attending: Oncology

## 2020-11-22 ENCOUNTER — Other Ambulatory Visit: Payer: Self-pay

## 2020-11-22 ENCOUNTER — Inpatient Hospital Stay (HOSPITAL_BASED_OUTPATIENT_CLINIC_OR_DEPARTMENT_OTHER): Payer: Medicare Other | Admitting: Oncology

## 2020-11-22 VITALS — BP 117/69 | HR 60 | Temp 97.2°F | Resp 16 | Wt 200.4 lb

## 2020-11-22 DIAGNOSIS — D509 Iron deficiency anemia, unspecified: Secondary | ICD-10-CM | POA: Insufficient documentation

## 2020-11-22 DIAGNOSIS — Z87891 Personal history of nicotine dependence: Secondary | ICD-10-CM | POA: Diagnosis not present

## 2020-11-22 DIAGNOSIS — D500 Iron deficiency anemia secondary to blood loss (chronic): Secondary | ICD-10-CM

## 2020-11-22 DIAGNOSIS — E538 Deficiency of other specified B group vitamins: Secondary | ICD-10-CM | POA: Insufficient documentation

## 2020-11-22 DIAGNOSIS — N1832 Chronic kidney disease, stage 3b: Secondary | ICD-10-CM | POA: Insufficient documentation

## 2020-11-22 DIAGNOSIS — D631 Anemia in chronic kidney disease: Secondary | ICD-10-CM | POA: Diagnosis not present

## 2020-11-22 LAB — IRON AND TIBC
Iron: 88 ug/dL (ref 45–182)
Saturation Ratios: 29 % (ref 17.9–39.5)
TIBC: 301 ug/dL (ref 250–450)
UIBC: 213 ug/dL

## 2020-11-22 LAB — CBC WITH DIFFERENTIAL/PLATELET
Abs Immature Granulocytes: 0.06 10*3/uL (ref 0.00–0.07)
Basophils Absolute: 0.1 10*3/uL (ref 0.0–0.1)
Basophils Relative: 1 %
Eosinophils Absolute: 1.2 10*3/uL — ABNORMAL HIGH (ref 0.0–0.5)
Eosinophils Relative: 13 %
HCT: 38.5 % — ABNORMAL LOW (ref 39.0–52.0)
Hemoglobin: 12.9 g/dL — ABNORMAL LOW (ref 13.0–17.0)
Immature Granulocytes: 1 %
Lymphocytes Relative: 13 %
Lymphs Abs: 1.2 10*3/uL (ref 0.7–4.0)
MCH: 30.6 pg (ref 26.0–34.0)
MCHC: 33.5 g/dL (ref 30.0–36.0)
MCV: 91.4 fL (ref 80.0–100.0)
Monocytes Absolute: 0.8 10*3/uL (ref 0.1–1.0)
Monocytes Relative: 8 %
Neutro Abs: 6 10*3/uL (ref 1.7–7.7)
Neutrophils Relative %: 64 %
Platelets: 226 10*3/uL (ref 150–400)
RBC: 4.21 MIL/uL — ABNORMAL LOW (ref 4.22–5.81)
RDW: 14.5 % (ref 11.5–15.5)
WBC: 9.3 10*3/uL (ref 4.0–10.5)
nRBC: 0 % (ref 0.0–0.2)

## 2020-11-22 LAB — VITAMIN B12: Vitamin B-12: 745 pg/mL (ref 180–914)

## 2020-11-22 LAB — FERRITIN: Ferritin: 106 ng/mL (ref 24–336)

## 2020-11-22 NOTE — Progress Notes (Signed)
Hematology/Oncology Follow Up Note [facility]  [phone] [phone]  Patient Care Team: Cletis Athens, MD as PCP - General (Cardiology) Cletis Athens, MD as Referring Physician (Cardiology) Christene Lye, MD (General Surgery) Alisa Graff, FNP as Nurse Practitioner (Family Medicine) Yolonda Kida, MD as Consulting Physician (Cardiology) Gabriel Carina Betsey Holiday, MD as Physician Assistant (Endocrinology) Lavonia Dana, MD as Consulting Physician (Nephrology) Allyne Gee, MD as Consulting Physician (Pulmonary Disease) [name], MD as Consulting Physician (Gastroenterology)   Name of the patient: Mike Decker  [id number]  31-Aug-1941   REASON FOR VISIT  follow-up for management of iron deficiency anemia, anemia and CKD, recent hospitalization   PERTINENT HEMATOLOGY Oncology HISTORY received IV Venofer daily x3 as well as multiple units of PRBC transfusion. GI was consulted and patient had EGD done during his hospitalization.  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  Treated with argon plasma coagulation.  Colonoscopy showed polyps which were removed. Patient was discharged on 12/15/2018.  # 03/23/2019 mammogram showed gynecomastia.  #History of cutaneous melanoma status post Mohs surgery. 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 95% stenosis in the left circumflex status post drug-eluting stent. Patient is on aspirin and Plavix prior to this cardiac event. 04/18/2020, patient has a ER visit due to chest pain. Troponin was negative. Pain improved with medication. Patient was discharged with outpatient cardiology follow-up.   INTERVAL HISTORY 79 y.o. male with PMH listed as below present to follow-up for anemia of CKD.  He was recently treated for cough shortness of breath bronchitis.  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  Patient reports that he is going to see Dr. Humphrey Rolls for evaluation of his lung issue. Otherwise he is feeling well.  Review of Systems  Constitutional: Negative for appetite change, chills, fatigue, fever and unexpected weight change.  HENT:   Negative for hearing loss and voice change.   Eyes: Negative for eye problems and icterus.  Respiratory: Positive for cough. Negative for chest tightness and shortness of breath.   Cardiovascular: Negative for chest pain and leg swelling.  Gastrointestinal: Negative for abdominal distention and abdominal pain.  Endocrine: Negative for hot flashes.  Genitourinary: Negative for difficulty urinating, dysuria and frequency.   Musculoskeletal: Negative for arthralgias.  Skin: Negative for itching and rash.  Neurological: Negative for light-headedness and numbness.  Hematological: Negative for adenopathy. Does not bruise/bleed easily.  Psychiatric/Behavioral: Negative for confusion.    Allergies  Allergen Reactions  . Gabapentin     Other reaction(s): Other (See Comments) Tremors  . Peanut-Containing Drug Products Anaphylaxis  . Penicillins Hives and Rash    Has patient had a PCN reaction causing immediate rash, facial/tongue/throat swelling, SOB or lightheadedness with hypotension: Yes Has patient had a PCN reaction causing severe rash involving mucus membranes or skin necrosis: No Has patient had a PCN reaction that required hospitalization No Has patient had a PCN reaction occurring within the last 10 years: No If all of the above answers are "NO", then may proceed with Cephalosporin use.  . Bee Venom Swelling  . Influenza Vaccines Hives  . Inh [Isoniazid] Hives  . Kenalog [Triamcinolone Acetonide] Hives  . Levaquin [Levofloxacin] Other (See Comments)    Tendon, ligament pain.   . Nalfon [Fenoprofen Calcium] Hives  . Naproxen   . Peanut Oil   .  Nsaids Rash    Nalfon 600 Nalfon 600     Past Medical History:  Diagnosis Date  . Anemia   . Arthritis   . Atrioventricular canal (AVC)    irregular heart beats  . Barrett esophagus   . Bronchiolitis   . 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 Chronic diastolic CHF (congestive heart failure) ([place])   . Colon polyp   . Diabetes mellitus without complication ([place])   . Diverticulosis   . Gout   . Heart disease   . Hemangioma    liver  . Hyperlipidemia   . Hypertension   . Myocardial infarct ([place])   . Ocular hypertension   . Peripheral vascular disease ([place])   . Skin cancer   . 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[place])   . Sleep apnea   . Vitreoretinal degeneration      Past Surgical History:  Procedure Laterality Date  . CATARACT EXTRACTION  2011, 2012  . COLONOSCOPY N/A 12/14/2018   Procedure: COLONOSCOPY;  Surgeon: Virgel Manifold, MD;  Location: [facility] ENDOSCOPY;  Service: Endoscopy;  Laterality: N/A;  . CORONARY ANGIOPLASTY WITH STENT PLACEMENT  2012  . CORONARY STENT INTERVENTION N/A 12/29/2019   Procedure: CORONARY STENT INTERVENTION;  Surgeon: Nelva Bush, MD;  Location: [place] [facility];  Service: Cardiovascular;  Laterality: N/A;  . ESOPHAGOGASTRODUODENOSCOPY N/A 12/14/2018   Procedure: ESOPHAGOGASTRODUODENOSCOPY (EGD);  Surgeon: Virgel Manifold, MD;  Location: [facility] ENDOSCOPY;  Service: Endoscopy;  Laterality: N/A;  . ESOPHAGOGASTRODUODENOSCOPY (EGD) WITH PROPOFOL N/A 06/01/2016   Procedure: ESOPHAGOGASTRODUODENOSCOPY (EGD) WITH PROPOFOL;  Surgeon: Manya Silvas, MD;  Location: [facility] ENDOSCOPY;  Service: Endoscopy;  Laterality: N/A;  . HERNIA REPAIR     x2  . INTRAOCULAR LENS INSERTION    . LEFT HEART CATH AND CORONARY ANGIOGRAPHY N/A 05/09/2017   Procedure: Left Heart Cath and Coronary Angiography;  Surgeon: Yolonda Kida, MD;  Location: [place] [facility];  Service: Cardiovascular;  Laterality: N/A;  . LEFT HEART CATH AND CORONARY ANGIOGRAPHY N/A 12/08/2018    Procedure: LEFT HEART CATH AND CORONARY ANGIOGRAPHY and possible PCI and stent;  Surgeon: Yolonda Kida, MD;  Location: [place] [facility];  Service: Cardiovascular;  Laterality: N/A;  . LEFT HEART CATH AND CORONARY ANGIOGRAPHY N/A 12/29/2019   Procedure: LEFT HEART CATH AND CORONARY ANGIOGRAPHY;  Surgeon: Nelva Bush, MD;  Location: [place] [facility];  Service: Cardiovascular;  Laterality: N/A;  . NOSE SURGERY     submucous resection  . PROSTATE SURGERY  2002  . ROTATOR CUFF REPAIR Right     Social History   Socioeconomic History  . Marital status: Married    Spouse name: Not on file  . Number of children: Not on file  . Years of education: Not on file  . Highest education level: Not on file  Occupational History  . Not on file  Tobacco Use  . Smoking status: Former Smoker    Years: 20.00    Types: Cigarettes    Quit date: 12/10/1976    Years since quitting: 43.9  . Smokeless tobacco: Never Used  Substance and Sexual Activity  . Alcohol use: No  . Drug use: No  . Sexual activity: Not on file  Other Topics Concern  . Not on file  Social History Narrative  . Not on file   Social Determinants of Health   Financial Resource Strain: Not on file  Food Insecurity: Not on file  Transportation Needs: Not on file  Physical  Activity: Not on file  Stress: Not on file  Social Connections: Not on file  Intimate Partner Violence: Not on file    Family History  Problem Relation Age of Onset  . Arthritis Mother   . Stroke Maternal Grandfather   . Breast cancer Neg Hx      Current Outpatient Medications:  .  acetaminophen (TYLENOL) 500 MG tablet, Take 1,000 mg by mouth every 6 (six) hours as needed for mild pain, moderate pain or headache., Disp: , Rfl:  .  allopurinol (ZYLOPRIM) 100 MG tablet, Take 100 mg by mouth daily. , Disp: , Rfl:  .  amLODipine (NORVASC) 5 MG tablet, Take 2.5 mg by mouth 2 (two) times daily. , Disp: , Rfl:  .  aspirin EC 81 MG EC tablet,  Take 1 tablet (81 mg total) by mouth daily., Disp: 60 tablet, Rfl: 0 .  azelastine (ASTELIN) 0.1 % nasal spray, USE 1 TO 2 SPRAYS IN EACH NOSTRIL TWICE A DAY, Disp: , Rfl:  .  budesonide-formoterol (SYMBICORT) 160-4.5 MCG/ACT inhaler, Inhale 2 puffs into the lungs 2 (two) times daily as needed. , Disp: , Rfl:  .  cetirizine (ZYRTEC) 5 MG tablet, Take 5 mg by mouth daily as needed for allergies or rhinitis. , Disp: , Rfl:  .  chlorpheniramine-HYDROcodone (TUSSIONEX PENNKINETIC ER) 10-8 MG/5ML SUER, Take 5 mLs by mouth every 12 (twelve) hours as needed for cough., Disp: 140 mL, Rfl: 0 .  citalopram (CELEXA) 10 MG tablet, Take 10 mg by mouth daily., Disp: , Rfl:  .  clopidogrel (PLAVIX) 75 MG tablet, Take 1 tablet (75 mg total) by mouth daily., Disp: 30 tablet, Rfl: 11 .  colchicine 0.6 MG tablet, Take 3 tablets (1.8 mg total) by mouth daily as needed (gout flares)., Disp: , Rfl:  .  dextromethorphan-guaiFENesin (MUCINEX DM) 30-600 MG 12hr tablet, Take 1 tablet by mouth 2 (two) times daily as needed for cough., Disp: , Rfl:  .  EPINEPHrine (EPI-PEN) 0.3 mg/0.3 mL SOAJ injection, Inject into the muscle as needed (anaphylaxis). , Disp: , Rfl:  .  ferrous sulfate 325 (65 FE) MG EC tablet, Take 1 tablet (325 mg total) by mouth daily., Disp: 90 tablet, Rfl: 0 .  fluticasone (FLONASE) 50 MCG/ACT nasal spray, USE 1 SPRAY IN EACH NOSTRIL EVERY DAY, Disp: 32 g, Rfl: 6 .  folic acid (FOLVITE) 938 MCG tablet, TAKE 1 TABLET (400 MCG TOTAL) BY MOUTH DAILY., Disp: 100 tablet, Rfl: 3 .  furosemide (LASIX) 20 MG tablet, Take 2 tablets (40 mg total) by mouth daily. (Patient taking differently: Take 60 mg by mouth 2 (two) times daily. Takes 40 mg in morning and if needed takes 40 mg in the evening), Disp: , Rfl:  .  hydrALAZINE (APRESOLINE) 25 MG tablet, Take 1 tablet (25 mg total) by mouth every 8 (eight) hours. (Patient taking differently: Take 25 mg by mouth 2 (two) times daily.), Disp: 60 tablet, Rfl: 0 .  insulin  aspart (NOVOLOG) 100 UNIT/ML FlexPen, 14 UNITS IN AM, 18 UNITS AT LUNCH, 24 UNITS AT DINNER, Disp: , Rfl:  .  insulin glargine (LANTUS) 100 UNIT/ML injection, Inject 0.35 mLs (35 Units total) into the skin at bedtime. This is a decrease from your previous 45 units nightly., Disp: 10 mL, Rfl: 11 .  ipratropium-albuterol (DUONEB) 0.5-2.5 (3) MG/3ML SOLN, Take 3 mLs by nebulization every 4 (four) hours as needed., Disp: 360 mL, Rfl: 0 .  isosorbide mononitrate (IMDUR) 60 MG 24 hr tablet, Take 120  mg by mouth daily. , Disp: , Rfl:  .  methocarbamol (ROBAXIN) 500 MG tablet, Take 500 mg by mouth 2 (two) times daily. , Disp: , Rfl: 0 .  metoprolol tartrate (LOPRESSOR) 50 MG tablet, Take 50 mg by mouth 3 (three) times daily. , Disp: , Rfl:  .  montelukast (SINGULAIR) 10 MG tablet, TAKE 1 TABLET BY MOUTH EVERYDAY AT BEDTIME, Disp: 90 tablet, Rfl: 1 .  nitroGLYCERIN (NITROSTAT) 0.4 MG SL tablet, Place 0.4 mg under the tongue every 5 (five) minutes x 3 doses as needed for chest pain. , Disp: , Rfl:  .  nystatin (MYCOSTATIN) 100000 UNIT/ML suspension, Take 5 mLs (500,000 Units total) by mouth 4 (four) times daily. Swish and swallow 3 times daily for 10 days, Disp: 60 mL, Rfl: 0 .  nystatin (MYCOSTATIN) 100000 UNIT/ML suspension, Take 5 mLs (500,000 Units total) by mouth 4 (four) times daily., Disp: 60 mL, Rfl: 0 .  Omega-3 Fatty Acids (FISH OIL) 1000 MG CAPS, Take 1 capsule by mouth daily. , Disp: , Rfl:  .  pantoprazole (PROTONIX) 40 MG tablet, Take 1 tablet (40 mg total) by mouth 2 (two) times daily before a meal., Disp: , Rfl:  .  rosuvastatin (CRESTOR) 40 MG tablet, Take 40 mg by mouth every evening. , Disp: , Rfl:  .  sodium bicarbonate 650 MG tablet, Take 650 mg by mouth 2 (two) times daily., Disp: , Rfl:  .  vitamin B-12 (CYANOCOBALAMIN) 1000 MCG tablet, Take 1 tablet (1,000 mcg total) by mouth daily., Disp: 90 tablet, Rfl: 3 .  vitamin C (ASCORBIC ACID) 250 MG tablet, Take 250 mg by mouth daily., Disp: ,  Rfl:  .  Vitamin D, Ergocalciferol, (DRISDOL) 50000 units CAPS capsule, Take 50,000 Units by mouth every 30 (thirty) days. , Disp: , Rfl: 1 .  zinc gluconate 50 MG tablet, Take 50 mg by mouth daily., Disp: , Rfl:  .  azithromycin (ZITHROMAX) 250 MG tablet, Take 1 tablet daily for 14 days (Patient not taking: Reported on 11/22/2020), Disp: 14 tablet, Rfl: 0 .  doxycycline (VIBRA-TABS) 100 MG tablet, Take 1 tablet (100 mg total) by mouth 2 (two) times daily. (Patient not taking: Reported on 11/22/2020), Disp: 20 tablet, Rfl: 0 .  ENTRESTO 24-26 MG, , Disp: , Rfl:  .  predniSONE (DELTASONE) 10 MG tablet, Take 2 tablets daily for 1 week followed by 1 tablet daily for 1 week. (Patient not taking: Reported on 11/22/2020), Disp: 21 tablet, Rfl: 0  Physical exam:  Vitals:   11/22/20 1057  BP: 117/69  Pulse: 60  Resp: 16  Temp: (!) 97.2 F (36.2 C)  Weight: 200 lb 6.4 oz (90.9 kg)   Physical Exam Constitutional:      General: He is not in acute distress.    Appearance: He is obese.  HENT:     Head: Normocephalic and atraumatic.  Eyes:     General: No scleral icterus.    Pupils: Pupils are equal, round, and reactive to light.  Cardiovascular:     Rate and Rhythm: Normal rate and regular rhythm.     Heart sounds: Normal heart sounds.  Pulmonary:     Effort: Pulmonary effort is normal. No respiratory distress.     Breath sounds: No wheezing.  Abdominal:     General: Bowel sounds are normal. There is no distension.     Palpations: Abdomen is soft. There is no mass.     Tenderness: There is no abdominal tenderness.  Musculoskeletal:  General: No deformity. Normal range of motion.     Cervical back: Normal range of motion and neck supple.  Skin:    General: Skin is warm and dry.     Findings: No erythema or rash.  Neurological:     Mental Status: He is alert and oriented to person, place, and time. Mental status is at baseline.     Cranial Nerves: No cranial nerve deficit.      Coordination: Coordination normal.  Psychiatric:        Mood and Affect: Mood normal.     CMP Latest Ref Rng & Units 07/07/2020  Glucose 70 - 99 mg/dL -  BUN 8 - 23 mg/dL -  Creatinine 0.61 - 1.24 mg/dL -  Sodium 135 - 145 mmol/L 141  Potassium 3.5 - 5.1 mmol/L 4.4  Chloride 98 - 111 mmol/L 106  CO2 22 - 32 mmol/L 24  Calcium 8.9 - 10.3 mg/dL -  Total Protein 6.5 - 8.1 g/dL -  Total Bilirubin 0.3 - 1.2 mg/dL -  Alkaline Phos 38 - 126 U/L -  AST 15 - 41 U/L -  ALT 0 - 44 U/L -   CBC Latest Ref Rng & Units 11/22/2020  WBC 4.0 - 10.5 K/uL 9.3  Hemoglobin 13.0 - 17.0 g/dL 12.9(L)  Hematocrit 39.0 - 52.0 % 38.5(L)  Platelets 150 - 400 K/uL 226    No results found.   Assessment and plan  1. 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 B12 deficiency    #Anemia in CKD Labs are reviewed and discussed with patient. He also has a history of iron deficiency anemia Today his hemoglobin is stable at 12.9. Iron panel is also stable. To continue oral iron supplementation. Hemoglobin is above 10, no need for intervention . History of Vitamin B12 deficiency, negative intrinsic factor antibody and antiparital antibody.  Vitamin B12 level is 745.  Recommend patient to continue oral vitamin B12 supplementation 2-3 times per week.  Follow up in 12 months   Orders Placed This Encounter  Procedures  . CBC with Differential/Platelet    Standing Status:   Future    Standing Expiration Date:   11/22/2021  . Comprehensive metabolic panel    Standing Status:   Future    Standing Expiration Date:   11/22/2021  . Ferritin    Standing Status:   Future    Standing Expiration Date:   11/22/2021  . Iron and TIBC    Standing Status:   Future    Standing Expiration Date:   11/22/2021  . Vitamin B12    Standing Status:   Future    Standing Expiration Date:   11/22/2021       Earlie Server, MD, PhD

## 2020-11-22 NOTE — Progress Notes (Signed)
Patient has recently been treated for bronchitis by PCP and pulmonologist.

## 2020-11-23 ENCOUNTER — Telehealth: Payer: Self-pay

## 2020-11-23 DIAGNOSIS — R809 Proteinuria, unspecified: Secondary | ICD-10-CM | POA: Diagnosis not present

## 2020-11-23 DIAGNOSIS — E1129 Type 2 diabetes mellitus with other diabetic kidney complication: Secondary | ICD-10-CM | POA: Diagnosis not present

## 2020-11-23 DIAGNOSIS — E1142 Type 2 diabetes mellitus with diabetic polyneuropathy: Secondary | ICD-10-CM | POA: Diagnosis not present

## 2020-11-23 DIAGNOSIS — E1122 Type 2 diabetes mellitus with diabetic chronic kidney disease: Secondary | ICD-10-CM | POA: Diagnosis not present

## 2020-11-23 DIAGNOSIS — Z794 Long term (current) use of insulin: Secondary | ICD-10-CM | POA: Diagnosis not present

## 2020-11-23 DIAGNOSIS — N1832 Chronic kidney disease, stage 3b: Secondary | ICD-10-CM | POA: Diagnosis not present

## 2020-11-23 DIAGNOSIS — E1165 Type 2 diabetes mellitus with hyperglycemia: Secondary | ICD-10-CM | POA: Diagnosis not present

## 2020-11-23 DIAGNOSIS — E1159 Type 2 diabetes mellitus with other circulatory complications: Secondary | ICD-10-CM | POA: Diagnosis not present

## 2020-11-23 NOTE — Telephone Encounter (Signed)
-----   Message from Earlie Server, MD sent at 11/22/2020  6:46 PM EST ----- Please let him know that iron panel is stable.  Recommend him to continue oral iron supplementation once daily.  B12 level has improved to 745.  I recommend patient to continue vitamin B12 1000 mcg 2-3 times per week.  Thank you keep scheduled appointment as is

## 2020-11-23 NOTE — Telephone Encounter (Signed)
Patient's wife, Webb Silversmith, notified of results. She will inform patient.

## 2020-11-24 ENCOUNTER — Other Ambulatory Visit: Payer: Self-pay

## 2020-11-24 ENCOUNTER — Ambulatory Visit (INDEPENDENT_AMBULATORY_CARE_PROVIDER_SITE_OTHER): Payer: Medicare Other | Admitting: Internal Medicine

## 2020-11-24 ENCOUNTER — Encounter: Payer: Self-pay | Admitting: Internal Medicine

## 2020-11-24 VITALS — BP 133/70 | HR 57 | Temp 98.1°F | Resp 16 | Ht 66.0 in | Wt 205.0 lb

## 2020-11-24 DIAGNOSIS — R059 Cough, unspecified: Secondary | ICD-10-CM

## 2020-11-24 DIAGNOSIS — J479 Bronchiectasis, uncomplicated: Secondary | ICD-10-CM | POA: Diagnosis not present

## 2020-11-24 DIAGNOSIS — I6523 Occlusion and stenosis of bilateral carotid arteries: Secondary | ICD-10-CM

## 2020-11-24 DIAGNOSIS — J4520 Mild intermittent asthma, uncomplicated: Secondary | ICD-10-CM

## 2020-11-24 MED ORDER — TRELEGY ELLIPTA 100-62.5-25 MCG/INH IN AEPB: 1.0000 | INHALATION_SPRAY | Freq: Every day | RESPIRATORY_TRACT | 4 refills | Status: DC

## 2020-11-24 NOTE — Patient Instructions (Signed)
Asthma, Adult  Asthma is a long-term (chronic) condition in which the airways get tight and narrow. The airways are the breathing passages that lead from the nose and mouth down into the lungs. A person with asthma will have times when symptoms get worse. These are called asthma attacks. They can cause coughing, whistling sounds when you breathe (wheezing), shortness of breath, and chest pain. They can make it hard to breathe. There is no cure for asthma, but medicines and lifestyle changes can help control it. There are many things that can bring on an asthma attack or make asthma symptoms worse (triggers). Common triggers include:  Mold.  Dust.  Cigarette smoke.  Cockroaches.  Things that can cause allergy symptoms (allergens). These include animal skin flakes (dander) and pollen from trees or grass.  Things that pollute the air. These may include household cleaners, wood smoke, smog, or chemical odors.  Cold air, weather changes, and wind.  Crying or laughing hard.  Stress.  Certain medicines or drugs.  Certain foods such as dried fruit, potato chips, and grape juice.  Infections, such as a cold or the flu.  Certain medical conditions or diseases.  Exercise or tiring activities. Asthma may be treated with medicines and by staying away from the things that cause asthma attacks. Types of medicines may include:  Controller medicines. These help prevent asthma symptoms. They are usually taken every day.  Fast-acting reliever or rescue medicines. These quickly relieve asthma symptoms. They are used as needed and provide short-term relief.  Allergy medicines if your attacks are brought on by allergens.  Medicines to help control the body's defense (immune) system. Follow these instructions at home: Avoiding triggers in your home  Change your heating and air conditioning filter often.  Limit your use of fireplaces and wood stoves.  Get rid of pests (such as roaches and  mice) and their droppings.  Throw away plants if you see mold on them.  Clean your floors. Dust regularly. Use cleaning products that do not smell.  Have someone vacuum when you are not home. Use a vacuum cleaner with a HEPA filter if possible.  Replace carpet with wood, tile, or vinyl flooring. Carpet can trap animal skin flakes and dust.  Use allergy-proof pillows, mattress covers, and box spring covers.  Wash bed sheets and blankets every week in hot water. Dry them in a dryer.  Keep your bedroom free of any triggers.  Avoid pets and keep windows closed when things that cause allergy symptoms are in the air.  Use blankets that are made of polyester or cotton.  Clean bathrooms and kitchens with bleach. If possible, have someone repaint the walls in these rooms with mold-resistant paint. Keep out of the rooms that are being cleaned and painted.  Wash your hands often with soap and water. If soap and water are not available, use hand sanitizer.  Do not allow anyone to smoke in your home. General instructions  Take over-the-counter and prescription medicines only as told by your doctor. ? Talk with your doctor if you have questions about how or when to take your medicines. ? Make note if you need to use your medicines more often than usual.  Do not use any products that contain nicotine or tobacco, such as cigarettes and e-cigarettes. If you need help quitting, ask your doctor.  Stay away from secondhand smoke.  Avoid doing things outdoors when allergen counts are high and when air quality is low.  Wear a ski mask   when doing outdoor activities in the winter. The mask should cover your nose and mouth. Exercise indoors on cold days if you can.  Warm up before you exercise. Take time to cool down after exercise.  Use a peak flow meter as told by your doctor. A peak flow meter is a tool that measures how well the lungs are working.  Keep track of the peak flow meter's readings.  Write them down.  Follow your asthma action plan. This is a written plan for taking care of your asthma and treating your attacks.  Make sure you get all the shots (vaccines) that your doctor recommends. Ask your doctor about a flu shot and a pneumonia shot.  Keep all follow-up visits as told by your doctor. This is important. Contact a doctor if:  You have wheezing, shortness of breath, or a cough even while taking medicine to prevent attacks.  The mucus you cough up (sputum) is thicker than usual.  The mucus you cough up changes from clear or white to yellow, green, gray, or bloody.  You have problems from the medicine you are taking, such as: ? A rash. ? Itching. ? Swelling. ? Trouble breathing.  You need reliever medicines more than 2-3 times a week.  Your peak flow reading is still at 50-79% of your personal best after following the action plan for 1 hour.  You have a fever. Get help right away if:  You seem to be worse and are not responding to medicine during an asthma attack.  You are short of breath even at rest.  You get short of breath when doing very little activity.  You have trouble eating, drinking, or talking.  You have chest pain or tightness.  You have a fast heartbeat.  Your lips or fingernails start to turn blue.  You are light-headed or dizzy, or you faint.  Your peak flow is less than 50% of your personal best.  You feel too tired to breathe normally. Summary  Asthma is a long-term (chronic) condition in which the airways get tight and narrow. An asthma attack can make it hard to breathe.  Asthma cannot be cured, but medicines and lifestyle changes can help control it.  Make sure you understand how to avoid triggers and how and when to use your medicines. This information is not intended to replace advice given to you by your health care provider. Make sure you discuss any questions you have with your health care provider. Document Revised:  01/29/2019 Document Reviewed: 12/31/2016 Elsevier Patient Education  2020 [employer] Inc.  

## 2020-11-24 NOTE — Procedures (Signed)
[facility] [place], [id number]  DATE OF SERVICE: November 16, 2020  Complete Pulmonary Function Testing Interpretation:  FINDINGS:  Forced vital capacity is normal.  FEV1 was 2.84 L which was 113% of predicted was normal.  F1 FVC ratio was normal.  Postbronchodilator no significant change in FEV1.  Total lung capacity was mildly decreased.  Residual volume is decreased.  Residual internal capacity ratio is decreased.  FRC was decreased.  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

## 2020-11-24 NOTE — Progress Notes (Signed)
[facility] [place], [place] [address]  Pulmonary Sleep Medicine   Office Visit Note  Patient Name: Mike Decker DOB: 05/11/1941 MRN [id number]  Date of Service: 11/24/2020  Complaints/HPI: He has had bronchitis flare ups treated with abx. Recently had a round of a zpk. Patient had PFT done showing normal FEV1 and reduced TLC. Recently was placed on trelegy which is working. He has been having some plugs when he coughs CT done shows some ILD with bronchiectasis and no frank honeycombing. Likely explains the flare ups. 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 Certainly MAI could be a consideration. Due to his cardiac history and renal issue I would be hesitant to do a routine bronch rather would try to get a sputum culture.  ROS  General: (-) fever, (-) chills, (-) night sweats, (-) weakness Skin: (-) rashes, (-) itching,. Eyes: (-) visual changes, (-) redness, (-) itching. Nose and Sinuses: (-) nasal stuffiness or itchiness, (-) postnasal drip, (-) nosebleeds, (-) sinus trouble. Mouth and Throat: (-) sore throat, (-) hoarseness. Neck: (-) swollen glands, (-) enlarged thyroid, (-) neck pain. Respiratory: + cough, (-) bloody sputum, + shortness of breath, + wheezing. Cardiovascular: - ankle swelling, (-) chest pain. Lymphatic: (-) lymph node enlargement. Neurologic: (-) numbness, (-) tingling. Psychiatric: (-) anxiety, (-) depression   Current Medication: Outpatient Encounter Medications as of 11/24/2020  Medication Sig Note  . acetaminophen (TYLENOL) 500 MG tablet Take 1,000 mg by mouth every 6 (six) hours as needed for mild pain, moderate pain or headache.   . allopurinol (ZYLOPRIM) 100 MG tablet Take 100 mg by mouth daily.    Marland Kitchen amLODipine (NORVASC) 5 MG tablet Take 2.5 mg by mouth 2 (two) times daily.    Marland Kitchen aspirin EC 81 MG EC tablet Take 1 tablet (81 mg total) by mouth  daily.   Marland Kitchen azelastine (ASTELIN) 0.1 % nasal spray USE 1 TO 2 SPRAYS IN EACH NOSTRIL TWICE A DAY   . azithromycin (ZITHROMAX) 250 MG tablet Take 1 tablet daily for 14 days (Patient not taking: Reported on 11/22/2020)   . budesonide-formoterol (SYMBICORT) 160-4.5 MCG/ACT inhaler Inhale 2 puffs into the lungs 2 (two) times daily as needed.    . cetirizine (ZYRTEC) 5 MG tablet Take 5 mg by mouth daily as needed for allergies or rhinitis.    . chlorpheniramine-HYDROcodone (TUSSIONEX PENNKINETIC ER) 10-8 MG/5ML SUER Take 5 mLs by mouth every 12 (twelve) hours as needed for cough.   . citalopram (CELEXA) 10 MG tablet Take 10 mg by mouth daily.   . clopidogrel (PLAVIX) 75 MG tablet Take 1 tablet (75 mg total) by mouth daily.   . colchicine 0.6 MG tablet Take 3 tablets (1.8 mg total) by mouth daily as needed (gout flares).   Marland Kitchen dextromethorphan-guaiFENesin (MUCINEX DM) 30-600 MG 12hr tablet Take 1 tablet by mouth 2 (two) times daily as needed for cough.   . doxycycline (VIBRA-TABS) 100 MG tablet Take 1 tablet (100 mg total) by mouth 2 (two) times daily. (Patient not taking: Reported on 11/22/2020)   . ENTRESTO 24-26 MG    . EPINEPHrine (EPI-PEN) 0.3 mg/0.3 mL SOAJ injection Inject into the muscle as needed (anaphylaxis).    . ferrous sulfate 325 (65 FE) MG EC tablet Take 1 tablet (325 mg total) by mouth daily.   . fluticasone (FLONASE) 50 MCG/ACT nasal spray USE 1 SPRAY IN [address]  NOSTRIL EVERY DAY   . Fluticasone-Umeclidin-Vilant (TRELEGY ELLIPTA) 100-62.5-25 MCG/INH AEPB Inhale 1 puff into the lungs daily.   . folic acid (FOLVITE) 530 MCG tablet TAKE 1 TABLET (400 MCG TOTAL) BY MOUTH DAILY.   . furosemide (LASIX) 20 MG tablet Take 2 tablets (40 mg total) by mouth daily. (Patient taking differently: Take 60 mg by mouth 2 (two) times daily. Takes 40 mg in morning and if needed takes 40 mg in the evening)   . hydrALAZINE (APRESOLINE) 25 MG tablet Take 1 tablet (25 mg total) by mouth every 8 (eight) hours.  (Patient taking differently: Take 25 mg by mouth 2 (two) times daily.)   . insulin aspart (NOVOLOG) 100 UNIT/ML FlexPen 14 UNITS IN AM, 18 UNITS AT LUNCH, 24 UNITS AT DINNER   . insulin glargine (LANTUS) 100 UNIT/ML injection Inject 0.35 mLs (35 Units total) into the skin at bedtime. This is a decrease from your previous 45 units nightly.   Marland Kitchen ipratropium-albuterol (DUONEB) 0.5-2.5 (3) MG/3ML SOLN Take 3 mLs by nebulization every 4 (four) hours as needed.   . isosorbide mononitrate (IMDUR) 60 MG 24 hr tablet Take 120 mg by mouth daily.    . methocarbamol (ROBAXIN) 500 MG tablet Take 500 mg by mouth 2 (two) times daily.    . metoprolol tartrate (LOPRESSOR) 50 MG tablet Take 50 mg by mouth 3 (three) times daily.    . montelukast (SINGULAIR) 10 MG tablet TAKE 1 TABLET BY MOUTH EVERYDAY AT BEDTIME   . nitroGLYCERIN (NITROSTAT) 0.4 MG SL tablet Place 0.4 mg under the tongue every 5 (five) minutes x 3 doses as needed for chest pain.    Marland Kitchen nystatin (MYCOSTATIN) 100000 UNIT/ML suspension Take 5 mLs (500,000 Units total) by mouth 4 (four) times daily. Swish and swallow 3 times daily for 10 days   . nystatin (MYCOSTATIN) 100000 UNIT/ML suspension Take 5 mLs (500,000 Units total) by mouth 4 (four) times daily.   . Omega-3 Fatty Acids (FISH OIL) 1000 MG CAPS Take 1 capsule by mouth daily.    . pantoprazole (PROTONIX) 40 MG tablet Take 1 tablet (40 mg total) by mouth 2 (two) times daily before a meal.   . predniSONE (DELTASONE) 10 MG tablet Take 2 tablets daily for 1 week followed by 1 tablet daily for 1 week. (Patient not taking: Reported on 11/22/2020)   . rosuvastatin (CRESTOR) 40 MG tablet Take 40 mg by mouth every evening.    . sodium bicarbonate 650 MG tablet Take 650 mg by mouth 2 (two) times daily.   . vitamin B-12 (CYANOCOBALAMIN) 1000 MCG tablet Take 1 tablet (1,000 mcg total) by mouth daily.   . vitamin C (ASCORBIC ACID) 250 MG tablet Take 250 mg by mouth daily.   . Vitamin D, Ergocalciferol, (DRISDOL)  50000 units CAPS capsule Take 50,000 Units by mouth every 30 (thirty) days.  12/14/2019: 23rd   . zinc gluconate 50 MG tablet Take 50 mg by mouth daily.    No facility-administered encounter medications on file as of 11/24/2020.    Surgical History: Past Surgical History:  Procedure Laterality Date  . CATARACT EXTRACTION  2011, 2012  . COLONOSCOPY N/A 12/14/2018   Procedure: COLONOSCOPY;  Surgeon: Virgel Manifold, MD;  Location: [facility] ENDOSCOPY;  Service: Endoscopy;  Laterality: N/A;  . CORONARY ANGIOPLASTY WITH STENT PLACEMENT  2012  . CORONARY STENT INTERVENTION N/A 12/29/2019   Procedure: CORONARY STENT INTERVENTION;  Surgeon: Nelva Bush, MD;  Location: [place] CV LAB;  Service: Cardiovascular;  Laterality: N/A;  . ESOPHAGOGASTRODUODENOSCOPY N/A 12/14/2018   Procedure: ESOPHAGOGASTRODUODENOSCOPY (EGD);  Surgeon: Virgel Manifold, MD;  Location: [facility] ENDOSCOPY;  Service: Endoscopy;  Laterality: N/A;  . ESOPHAGOGASTRODUODENOSCOPY (EGD) WITH PROPOFOL N/A 06/01/2016   Procedure: ESOPHAGOGASTRODUODENOSCOPY (EGD) WITH PROPOFOL;  Surgeon: Manya Silvas, MD;  Location: [facility] ENDOSCOPY;  Service: Endoscopy;  Laterality: N/A;  . HERNIA REPAIR     x2  . INTRAOCULAR LENS INSERTION    . LEFT HEART CATH AND CORONARY ANGIOGRAPHY N/A 05/09/2017   Procedure: Left Heart Cath and Coronary Angiography;  Surgeon: Yolonda Kida, MD;  Location: [place] [facility] LAB;  Service: Cardiovascular;  Laterality: N/A;  . LEFT HEART CATH AND CORONARY ANGIOGRAPHY N/A 12/08/2018   Procedure: LEFT HEART CATH AND CORONARY ANGIOGRAPHY and possible PCI and stent;  Surgeon: Yolonda Kida, MD;  Location: [place] [facility] LAB;  Service: Cardiovascular;  Laterality: N/A;  . LEFT HEART CATH AND CORONARY ANGIOGRAPHY N/A 12/29/2019   Procedure: LEFT HEART CATH AND CORONARY ANGIOGRAPHY;  Surgeon: Nelva Bush, MD;  Location: [place] [facility] LAB;  Service: Cardiovascular;  Laterality: N/A;  . NOSE SURGERY      submucous resection  . PROSTATE SURGERY  2002  . ROTATOR CUFF REPAIR Right     Medical History: Past Medical History:  Diagnosis Date  . Anemia   . Arthritis   . Atrioventricular canal (AVC)    irregular heart beats  . Barrett esophagus   . Bronchiolitis   . Cancer ([place]) 2002   prostate, esphageal  . Chronic diastolic CHF (congestive heart failure) ([place])   . Colon polyp   . Diabetes mellitus without complication ([place])   . Diverticulosis   . Gout   . Heart disease   . Hemangioma    liver  . Hyperlipidemia   . Hypertension   . Myocardial infarct ([place])   . Ocular hypertension   . Peripheral vascular disease ([place])   . Skin cancer   . 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[place])   . Sleep apnea   . Vitreoretinal degeneration     Family History: Family History  Problem Relation Age of Onset  . Arthritis Mother   . Stroke Maternal Grandfather   . Breast cancer Neg Hx     Social History: Social History   Socioeconomic History  . Marital status: Married    Spouse name: Not on file  . Number of children: Not on file  . Years of education: Not on file  . Highest education level: Not on file  Occupational History  . Not on file  Tobacco Use  . Smoking status: Former Smoker    Years: 20.00    Types: Cigarettes    Quit date: 12/10/1976    Years since quitting: 43.9  . Smokeless tobacco: Never Used  Substance and Sexual Activity  . Alcohol use: No  . Drug use: No  . Sexual activity: Not on file  Other Topics Concern  . Not on file  Social History Narrative  . Not on file   Social Determinants of Health   Financial Resource Strain: Not on file  Food Insecurity: Not on file  Transportation Needs: Not on file  Physical Activity: Not on file  Stress: Not on file  Social Connections: Not on file  Intimate Partner Violence: Not on file    Vital Signs: Blood pressure 133/70, pulse (!) 57, temperature 98.1 F (36.7 C), resp. rate 16, height _0  (1.676 m), weight 205 lb (93 kg),  SpO2 98 %.  Examination: General Appearance: The patient is well-developed, well-nourished, and in no distress. Skin: Gross inspection of skin unremarkable. Head: normocephalic, no gross deformities. Eyes: no gross deformities noted. ENT: ears appear grossly normal no exudates. Neck: Supple. No thyromegaly. No LAD. Respiratory: few rhonchi noted. Cardiovascular: Normal S1 and S2 without murmur or rub. Extremities: No cyanosis. pulses are equal. Neurologic: Alert and oriented. No involuntary movements.  LABS: Recent Results (from the past 2160 hour(s))  POC COVID-19     Status: Normal   Collection Time: 09/13/20  2:41 PM  Result Value Ref Range   SARS Coronavirus 2 Ag Negative Negative  POCT urinalysis dipstick     Status: Abnormal   Collection Time: 11/01/20  3:12 PM  Result Value Ref Range   Color, UA orange     Comment: taking AZO   Clarity, UA cloudy\    Glucose, UA Negative Negative   Bilirubin, UA 1+    Ketones, UA neg    Spec Grav, UA 1.020 1.010 - 1.025   Blood, UA neg    pH, UA 5.0 5.0 - 8.0   Protein, UA Positive (A) Negative   Urobilinogen, UA 2.0 (A) 0.2 or 1.0 E.U./dL   Nitrite, UA positive    Leukocytes, UA Large (3+) (A) Negative   Appearance cloudy    Odor none   Vitamin B12     Status: None   Collection Time: 11/22/20 10:18 AM  Result Value Ref Range   Vitamin B-12 745 180 - 914 pg/mL    Comment: (NOTE) This assay is not validated for testing neonatal or myeloproliferative syndrome specimens for Vitamin B12 levels. Performed at [facility], [place] [address] [place], [place] [address]   Iron and TIBC     Status: None   Collection Time: 11/22/20 10:18 AM  Result Value Ref Range   Iron 88 45 - 182 ug/dL   TIBC 301 250 - 450 ug/dL   Saturation Ratios 29 17.9 - 39.5 %   UIBC 213 ug/dL    Comment: Performed at [facility], [address] [place], [place] [address]  Ferritin     Status: None   Collection Time: 11/22/20 10:18 AM   Result Value Ref Range   Ferritin 106 24 - 336 ng/mL    Comment: Performed at [facility], [address] [place], [place] [address]  CBC with Differential/Platelet     Status: Abnormal   Collection Time: 11/22/20 10:18 AM  Result Value Ref Range   WBC 9.3 4.0 - 10.5 K/uL   RBC 4.21 (L) 4.22 - 5.81 MIL/uL   Hemoglobin 12.9 (L) 13.0 - 17.0 g/dL   HCT 38.5 (L) 39.0 - 52.0 %   MCV 91.4 80.0 - 100.0 fL   MCH 30.6 26.0 - 34.0 pg   MCHC 33.5 30.0 - 36.0 g/dL   RDW 14.5 11.5 - 15.5 %   Platelets 226 150 - 400 K/uL   nRBC 0.0 0.0 - 0.2 %   Neutrophils Relative % 64 %   Neutro Abs 6.0 1.7 - 7.7 K/uL   Lymphocytes Relative 13 %   Lymphs Abs 1.2 0.7 - 4.0 K/uL   Monocytes Relative 8 %   Monocytes Absolute 0.8 0.1 - 1.0 K/uL   Eosinophils Relative 13 %   Eosinophils Absolute 1.2 (H) 0.0 - 0.5 K/uL   Basophils Relative 1 %   Basophils Absolute 0.1 0.0 - 0.1 K/uL   Immature Granulocytes 1 %   Abs Immature Granulocytes  0.06 0.00 - 0.07 K/uL    Comment: Performed at [facility], [address], [place], [place] [address]    Radiology: CT Chest High Resolution  Result Date: 10/21/2020 CLINICAL DATA:  Dyspnea and wheezing. EXAM: CT CHEST WITHOUT CONTRAST TECHNIQUE: Multidetector CT imaging of the chest was performed following the standard protocol without intravenous contrast. High resolution imaging of the lungs, as well as inspiratory and expiratory imaging, was performed. COMPARISON:  12/11/2018 high-resolution chest CT. FINDINGS: Cardiovascular: Normal heart size. No significant pericardial effusion/thickening. Three-vessel coronary atherosclerosis. Aortic valvular calcifications. Atherosclerotic nonaneurysmal thoracic aorta. Normal caliber pulmonary arteries. Mediastinum/Nodes: No discrete thyroid nodules. Unremarkable esophagus. No pathologically enlarged axillary, mediastinal or hilar lymph nodes, noting limited sensitivity for the detection of hilar adenopathy on this  noncontrast study. Lungs/Pleura: No pneumothorax. No pleural effusion. No acute consolidative airspace disease, lung masses or significant pulmonary nodules. Several scattered tiny subcentimeter calcified granulomas in both lungs, unchanged. No significant lobular air trapping or evidence of tracheobronchomalacia on the expiration sequence. There is patchy subpleural ground-glass opacity and reticulation throughout both lungs with associated mild traction bronchiectasis and architectural distortion. No frank honeycombing. Slight basilar predominance to these findings. Compared to 12/11/2018 chest CT, the ground-glass opacities have decreased and the reticulation and bronchiectasis are stable. No progressive findings. Upper abdomen: Small hiatal hernia. Hypodense 3.4 cm right liver dome mass (series 2/image 101), stable since 08/17/2011 CT abdomen study where it was seen to represent a hemangioma. Musculoskeletal: No aggressive appearing focal osseous lesions. Mild gynecomastia, asymmetric to the right, increased. Mild thoracic spondylosis. IMPRESSION: 1. 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 Ground-glass opacities have decreased since 12/11/2018 chest CT and bronchiectasis and reticulation are stable. No progressive findings in the interval. 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 [place], Iss 5, [phone], Aug 10 2017. 2. Three-vessel coronary atherosclerosis. 3. Small hiatal hernia. 4. Mild gynecomastia, asymmetric to the right, increased. 5. Aortic Atherosclerosis (ICD10-I70.0). Electronically Signed   By: Ilona Sorrel M.D.   On: 10/21/2020 10:32    No results found.  No results found.    Assessment and  Plan: Patient Active Problem List   Diagnosis Date Noted  . Urinary tract infection without hematuria 11/01/2020  . Dysuria 11/01/2020  . Cough 09/13/2020  . Bronchitis 07/15/2020  . Palpitations 07/15/2020  . Aortic atherosclerosis ([place]) 06/21/2020  . Heart failure ([place]) 06/21/2020  . Otalgia of both ears 05/27/2020  . Lobar pneumonia ([place])   . Type 2 diabetes mellitus with stage 4 chronic kidney disease, without long-term current use of insulin ([place]) 12/14/2019  . Elevated troponin   . Acidosis 10/30/2019  . Benign 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 10/30/2019  . Hyperkalemia 10/30/2019  . Secondary hyperparathyroidism of renal origin ([place]) 10/30/2019  . 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 Encounter for fitting and adjustment of hearing aid 06/16/2019  . Health maintenance alteration 06/16/2019  . Prostate cancer ([place]) 06/16/2019  . DM type 2 with diabetic peripheral neuropathy ([place]) 01/25/2019  . CKD (chronic kidney disease) stage 4, GFR 15-29 ml/min (HCC) 01/21/2019  . Gout 01/21/2019  . MR (mitral regurgitation) 01/21/2019  . Chronic obstructive pulmonary disease ([place]) 01/08/2019  . OSA (obstructive sleep apnea) 01/08/2019  . Chronic diastolic heart failure ([place]) 12/30/2018  . Polyp  of sigmoid colon   . Benign neoplasm of ascending colon   . Hemorrhoids without complication   . Diverticulosis of large intestine without diverticulitis   . Reflux esophagitis   . Lymphangiectasia   . Arteriovenous malformation of duodenum   . Symptomatic anemia   . SOB (shortness of breath)   . Iron deficiency anemia due to chronic blood loss   . Unstable angina ([place])   . Anemia of chronic renal failure, stage 4 (severe) ([place])   . Chest pain 11/14/2018  . Sciatica 11/12/2018  . CAD (coronary artery disease) 02/27/2018  . Sacroiliac joint pain 10/08/2017  . Pain in limb 09/24/2017  . Neuropathy 07/24/2017  . NSTEMI (non-ST elevated myocardial  infarction) ([place]) 05/06/2017  . PAD (peripheral artery disease) ([place]) 05/01/2017  . History of stroke 01/02/2017  . Barrett's esophagus 10/02/2016  . Carotid stenosis 10/02/2016  . Essential hypertension 10/02/2016  . Hyperlipidemia 10/02/2016  . Diabetes ([place]) 10/02/2016  . 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 Uncontrolled type 2 diabetes mellitus with hyperglycemia, with long-term current use of insulin ([place]) 04/10/2016  . TIA (transient ischemic attack) 10/08/2015    1. SOB due to chronic bronchiectasis. The PFT shows restrictive patter no obstruction agree with trelegy inhaler 2. Bronchiectasis symptomatic treatment. I will get sputum cultures on him specifically for AFB MAC in the differential 3. GERd under control. Needs to work on prevention with positional change 4. CRF he is seeing nephrology  General Counseling: I have discussed the findings of the evaluation and examination with Marland Kitchen.  I have also discussed any further diagnostic evaluation thatmay be needed or ordered today. Jaevian verbalizes understanding of the findings of todays visit. We also reviewed his medications today and discussed drug interactions and side effects including but not limited excessive drowsiness and altered mental states. We also discussed that there is always a risk not just to him but also people around him. he has been encouraged to call the office with any questions or concerns that should arise related to todays visit.  Orders Placed This Encounter  Procedures  . AFB Culture & Smear     Time spent: 30  I have personally obtained a history, examined the patient, evaluated laboratory and imaging results, formulated the assessment and plan and placed orders.    Allyne Gee, MD [facility] Pulmonary and Critical Care Sleep medicine

## 2020-11-25 ENCOUNTER — Other Ambulatory Visit: Payer: Self-pay | Admitting: Internal Medicine

## 2020-11-25 ENCOUNTER — Other Ambulatory Visit: Payer: Medicare Other

## 2020-11-25 ENCOUNTER — Ambulatory Visit: Payer: Medicare Other | Admitting: Oncology

## 2020-11-25 DIAGNOSIS — R791 Abnormal coagulation profile: Secondary | ICD-10-CM | POA: Diagnosis not present

## 2020-11-25 DIAGNOSIS — J40 Bronchitis, not specified as acute or chronic: Secondary | ICD-10-CM | POA: Diagnosis not present

## 2020-11-25 DIAGNOSIS — R972 Elevated prostate specific antigen [PSA]: Secondary | ICD-10-CM | POA: Diagnosis not present

## 2020-11-25 DIAGNOSIS — A230 Brucellosis due to Brucella melitensis: Secondary | ICD-10-CM | POA: Diagnosis not present

## 2020-11-25 DIAGNOSIS — R5383 Other fatigue: Secondary | ICD-10-CM | POA: Diagnosis not present

## 2020-11-25 DIAGNOSIS — R093 Abnormal sputum: Secondary | ICD-10-CM | POA: Diagnosis not present

## 2020-11-25 DIAGNOSIS — B9721 SARS-associated coronavirus as the cause of diseases classified elsewhere: Secondary | ICD-10-CM | POA: Diagnosis not present

## 2020-11-25 DIAGNOSIS — R6889 Other general symptoms and signs: Secondary | ICD-10-CM | POA: Diagnosis not present

## 2020-11-25 DIAGNOSIS — R8290 Unspecified abnormal findings in urine: Secondary | ICD-10-CM | POA: Diagnosis not present

## 2020-11-25 DIAGNOSIS — K900 Celiac disease: Secondary | ICD-10-CM | POA: Diagnosis not present

## 2020-11-25 DIAGNOSIS — D6852 Prothrombin gene mutation: Secondary | ICD-10-CM | POA: Diagnosis not present

## 2020-11-28 LAB — GRAM STAIN W/SPUTUM CULT RFLX

## 2020-11-28 LAB — SPUTUM CULTURE

## 2020-12-01 ENCOUNTER — Telehealth: Payer: Self-pay

## 2020-12-01 NOTE — Telephone Encounter (Signed)
Printed off patient asst application and mailed to patient.  Spoke to pt's wife and she advised to mail form per nimisha, cma

## 2020-12-01 NOTE — Telephone Encounter (Signed)
-----   Message from Corlis Hove sent at 11/30/2020  3:25 PM EST ----- Pt called that trelegy is very expensive taylor said can you check he can get PT assistance  due to Trelegy help pt

## 2020-12-12 ENCOUNTER — Telehealth: Payer: Self-pay

## 2020-12-14 ENCOUNTER — Telehealth: Payer: Self-pay

## 2020-12-14 NOTE — Telephone Encounter (Signed)
LMOM about sputum culture results for patient.  Per DSK results showed Normal Flora and no evidence of Pseudomonas

## 2020-12-15 DIAGNOSIS — Z01818 Encounter for other preprocedural examination: Secondary | ICD-10-CM | POA: Diagnosis not present

## 2020-12-15 DIAGNOSIS — E1165 Type 2 diabetes mellitus with hyperglycemia: Secondary | ICD-10-CM | POA: Diagnosis not present

## 2020-12-15 DIAGNOSIS — Z794 Long term (current) use of insulin: Secondary | ICD-10-CM | POA: Diagnosis not present

## 2020-12-15 DIAGNOSIS — R809 Proteinuria, unspecified: Secondary | ICD-10-CM | POA: Diagnosis not present

## 2020-12-15 DIAGNOSIS — I700 Atherosclerosis of aorta: Secondary | ICD-10-CM | POA: Diagnosis not present

## 2020-12-15 DIAGNOSIS — E1142 Type 2 diabetes mellitus with diabetic polyneuropathy: Secondary | ICD-10-CM | POA: Diagnosis not present

## 2020-12-15 DIAGNOSIS — G4733 Obstructive sleep apnea (adult) (pediatric): Secondary | ICD-10-CM | POA: Diagnosis not present

## 2020-12-15 DIAGNOSIS — I739 Peripheral vascular disease, unspecified: Secondary | ICD-10-CM | POA: Diagnosis not present

## 2020-12-15 DIAGNOSIS — E1122 Type 2 diabetes mellitus with diabetic chronic kidney disease: Secondary | ICD-10-CM | POA: Diagnosis not present

## 2020-12-15 DIAGNOSIS — D500 Iron deficiency anemia secondary to blood loss (chronic): Secondary | ICD-10-CM | POA: Diagnosis not present

## 2020-12-15 DIAGNOSIS — I2510 Atherosclerotic heart disease of native coronary artery without angina pectoris: Secondary | ICD-10-CM | POA: Diagnosis not present

## 2020-12-15 DIAGNOSIS — C155 Malignant neoplasm of lower third of esophagus: Secondary | ICD-10-CM | POA: Diagnosis not present

## 2020-12-15 DIAGNOSIS — D631 Anemia in chronic kidney disease: Secondary | ICD-10-CM | POA: Diagnosis not present

## 2020-12-15 DIAGNOSIS — E1151 Type 2 diabetes mellitus with diabetic peripheral angiopathy without gangrene: Secondary | ICD-10-CM | POA: Diagnosis not present

## 2020-12-15 DIAGNOSIS — I6529 Occlusion and stenosis of unspecified carotid artery: Secondary | ICD-10-CM | POA: Diagnosis not present

## 2020-12-15 DIAGNOSIS — C61 Malignant neoplasm of prostate: Secondary | ICD-10-CM | POA: Diagnosis not present

## 2020-12-15 DIAGNOSIS — C159 Malignant neoplasm of esophagus, unspecified: Secondary | ICD-10-CM | POA: Diagnosis not present

## 2020-12-15 DIAGNOSIS — N1832 Chronic kidney disease, stage 3b: Secondary | ICD-10-CM | POA: Diagnosis not present

## 2020-12-15 DIAGNOSIS — N184 Chronic kidney disease, stage 4 (severe): Secondary | ICD-10-CM | POA: Diagnosis not present

## 2020-12-15 DIAGNOSIS — J449 Chronic obstructive pulmonary disease, unspecified: Secondary | ICD-10-CM | POA: Diagnosis not present

## 2020-12-15 DIAGNOSIS — E1129 Type 2 diabetes mellitus with other diabetic kidney complication: Secondary | ICD-10-CM | POA: Diagnosis not present

## 2020-12-15 DIAGNOSIS — I10 Essential (primary) hypertension: Secondary | ICD-10-CM | POA: Diagnosis not present

## 2020-12-15 DIAGNOSIS — I340 Nonrheumatic mitral (valve) insufficiency: Secondary | ICD-10-CM | POA: Diagnosis not present

## 2020-12-15 DIAGNOSIS — E782 Mixed hyperlipidemia: Secondary | ICD-10-CM | POA: Diagnosis not present

## 2020-12-15 DIAGNOSIS — I5032 Chronic diastolic (congestive) heart failure: Secondary | ICD-10-CM | POA: Diagnosis not present

## 2020-12-15 NOTE — Telephone Encounter (Signed)
LMOM informing pt of his sputum results

## 2020-12-20 ENCOUNTER — Ambulatory Visit (INDEPENDENT_AMBULATORY_CARE_PROVIDER_SITE_OTHER): Payer: Medicare Other

## 2020-12-20 ENCOUNTER — Encounter (INDEPENDENT_AMBULATORY_CARE_PROVIDER_SITE_OTHER): Payer: Self-pay | Admitting: Vascular Surgery

## 2020-12-20 ENCOUNTER — Other Ambulatory Visit: Payer: Self-pay

## 2020-12-20 ENCOUNTER — Ambulatory Visit (INDEPENDENT_AMBULATORY_CARE_PROVIDER_SITE_OTHER): Payer: Medicare Other | Admitting: Vascular Surgery

## 2020-12-20 VITALS — BP 143/69 | HR 60 | Ht 66.0 in | Wt 201.0 lb

## 2020-12-20 DIAGNOSIS — I10 Essential (primary) hypertension: Secondary | ICD-10-CM

## 2020-12-20 DIAGNOSIS — D649 Anemia, unspecified: Secondary | ICD-10-CM

## 2020-12-20 DIAGNOSIS — I6523 Occlusion and stenosis of bilateral carotid arteries: Secondary | ICD-10-CM | POA: Diagnosis not present

## 2020-12-20 DIAGNOSIS — E785 Hyperlipidemia, unspecified: Secondary | ICD-10-CM | POA: Diagnosis not present

## 2020-12-20 DIAGNOSIS — N184 Chronic kidney disease, stage 4 (severe): Secondary | ICD-10-CM

## 2020-12-20 DIAGNOSIS — I739 Peripheral vascular disease, unspecified: Secondary | ICD-10-CM

## 2020-12-20 DIAGNOSIS — E1122 Type 2 diabetes mellitus with diabetic chronic kidney disease: Secondary | ICD-10-CM | POA: Diagnosis not present

## 2020-12-20 NOTE — Assessment & Plan Note (Signed)
No new symptoms.  To be checked again in about 6 months

## 2020-12-20 NOTE — Progress Notes (Signed)
MRN : [id number]  Mike Decker is a 80 y.o. (1941-03-14) male who presents with chief complaint of  Chief Complaint  Patient presents with  . Carotid  .  History of Present Illness: Patient returns in follow-up of his carotid disease.  He has had some pulmonary issues and other things going on since his last visit, but no focal neurologic symptoms or vascular symptoms.  He also has mild PAD which is scheduled to be checked again later this year. Duplex findings today show velocities in the 40 to 59% range on the right today which is a little lower than the 60 to 79% velocities seen at his last visit.  Left carotid velocities remain in the 1 to 39% range.  Current Outpatient Medications  Medication Sig Dispense Refill  . acetaminophen (TYLENOL) 500 MG tablet Take 1,000 mg by mouth every 6 (six) hours as needed for mild pain, moderate pain or headache.    . allopurinol (ZYLOPRIM) 100 MG tablet Take 100 mg by mouth daily.     Marland Kitchen aspirin EC 81 MG EC tablet Take 1 tablet (81 mg total) by mouth daily. 60 tablet 0  . azelastine (ASTELIN) 0.1 % nasal spray USE 1 TO 2 SPRAYS IN EACH NOSTRIL TWICE A DAY    . budesonide-formoterol (SYMBICORT) 160-4.5 MCG/ACT inhaler Inhale 2 puffs into the lungs 2 (two) times daily as needed.     . cetirizine (ZYRTEC) 5 MG tablet Take 5 mg by mouth daily as needed for allergies or rhinitis.     . chlorpheniramine-HYDROcodone (TUSSIONEX PENNKINETIC ER) 10-8 MG/5ML SUER Take 5 mLs by mouth every 12 (twelve) hours as needed for cough. 140 mL 0  . citalopram (CELEXA) 10 MG tablet Take 10 mg by mouth daily.    . clopidogrel (PLAVIX) 75 MG tablet Take 1 tablet (75 mg total) by mouth daily. 30 tablet 11  . colchicine 0.6 MG tablet Take 3 tablets (1.8 mg total) by mouth daily as needed (gout flares).    Marland Kitchen dextromethorphan-guaiFENesin (MUCINEX DM) 30-600 MG 12hr tablet Take 1 tablet by mouth 2 (two) times daily as needed for cough.    . EPINEPHrine (EPI-PEN) 0.3 mg/0.3  mL SOAJ injection Inject into the muscle as needed (anaphylaxis).     . ferrous sulfate 325 (65 FE) MG EC tablet Take 1 tablet (325 mg total) by mouth daily. 90 tablet 0  . fluticasone (FLONASE) 50 MCG/ACT nasal spray USE 1 SPRAY IN EACH NOSTRIL EVERY DAY 32 g 6  . Fluticasone-Umeclidin-Vilant (TRELEGY ELLIPTA) 100-62.5-25 MCG/INH AEPB Inhale 1 puff into the lungs daily. 3 each 4  . folic acid (FOLVITE) 657 MCG tablet TAKE 1 TABLET (400 MCG TOTAL) BY MOUTH DAILY. 100 tablet 3  . furosemide (LASIX) 20 MG tablet Take 2 tablets (40 mg total) by mouth daily. (Patient taking differently: Take 60 mg by mouth 2 (two) times daily. Takes 40 mg in morning and if needed takes 40 mg in the evening)    . insulin aspart (NOVOLOG) 100 UNIT/ML FlexPen 14 UNITS IN AM, 18 UNITS AT LUNCH, 24 UNITS AT DINNER    . insulin glargine (LANTUS) 100 UNIT/ML injection Inject 0.35 mLs (35 Units total) into the skin at bedtime. This is a decrease from your previous 45 units nightly. 10 mL 11  . ipratropium-albuterol (DUONEB) 0.5-2.5 (3) MG/3ML SOLN Take 3 mLs by nebulization every 4 (four) hours as needed. 360 mL 0  . isosorbide mononitrate (IMDUR) 60 MG 24 hr tablet Take 120  mg by mouth daily.     . methocarbamol (ROBAXIN) 500 MG tablet Take 500 mg by mouth 2 (two) times daily.   0  . metoprolol tartrate (LOPRESSOR) 50 MG tablet Take 50 mg by mouth 3 (three) times daily.     . montelukast (SINGULAIR) 10 MG tablet TAKE 1 TABLET BY MOUTH EVERYDAY AT BEDTIME 90 tablet 1  . nitroGLYCERIN (NITROSTAT) 0.4 MG SL tablet Place 0.4 mg under the tongue every 5 (five) minutes x 3 doses as needed for chest pain.     . Omega-3 Fatty Acids (FISH OIL) 1000 MG CAPS Take 1 capsule by mouth daily.     . pantoprazole (PROTONIX) 40 MG tablet Take 1 tablet (40 mg total) by mouth 2 (two) times daily before a meal.    . rosuvastatin (CRESTOR) 40 MG tablet Take 40 mg by mouth every evening.     . sodium bicarbonate 650 MG tablet Take 650 mg by mouth 2  (two) times daily.    . vitamin B-12 (CYANOCOBALAMIN) 1000 MCG tablet Take 1 tablet (1,000 mcg total) by mouth daily. 90 tablet 3  . Vitamin D, Ergocalciferol, (DRISDOL) 50000 units CAPS capsule Take 50,000 Units by mouth every 30 (thirty) days.   1  . zinc gluconate 50 MG tablet Take 50 mg by mouth daily.    Marland Kitchen azithromycin (ZITHROMAX) 250 MG tablet Take 1 tablet daily for 14 days (Patient not taking: No sig reported) 14 tablet 0  . doxycycline (VIBRA-TABS) 100 MG tablet Take 1 tablet (100 mg total) by mouth 2 (two) times daily. (Patient not taking: No sig reported) 20 tablet 0  . nystatin (MYCOSTATIN) 100000 UNIT/ML suspension Take 5 mLs (500,000 Units total) by mouth 4 (four) times daily. Swish and swallow 3 times daily for 10 days (Patient not taking: Reported on 12/20/2020) 60 mL 0  . nystatin (MYCOSTATIN) 100000 UNIT/ML suspension Take 5 mLs (500,000 Units total) by mouth 4 (four) times daily. (Patient not taking: Reported on 12/20/2020) 60 mL 0  . predniSONE (DELTASONE) 10 MG tablet Take 2 tablets daily for 1 week followed by 1 tablet daily for 1 week. (Patient not taking: No sig reported) 21 tablet 0  . vitamin C (ASCORBIC ACID) 250 MG tablet Take 250 mg by mouth daily. (Patient not taking: Reported on 12/20/2020)     No current facility-administered medications for this visit.    Past Medical History:  Diagnosis Date  . Anemia   . Arthritis   . Atrioventricular canal (AVC)    irregular heart beats  . Barrett esophagus   . Bronchiolitis   . 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 Chronic diastolic CHF (congestive heart failure) ([place])   . Colon polyp   . Diabetes mellitus without complication ([place])   . Diverticulosis   . Gout   . Heart disease   . Hemangioma    liver  . Hyperlipidemia   . Hypertension   . Myocardial infarct ([place])   . Ocular hypertension   . Peripheral vascular disease ([place])   . Skin cancer   . 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[place])   . Sleep apnea   . Vitreoretinal  degeneration     Past Surgical History:  Procedure Laterality Date  . CATARACT EXTRACTION  2011, 2012  . COLONOSCOPY N/A 12/14/2018   Procedure: COLONOSCOPY;  Surgeon: Virgel Manifold, MD;  Location: [facility] ENDOSCOPY;  Service: Endoscopy;  Laterality: N/A;  . CORONARY ANGIOPLASTY WITH STENT PLACEMENT  2012  . CORONARY STENT  INTERVENTION N/A 12/29/2019   Procedure: CORONARY STENT INTERVENTION;  Surgeon: Nelva Bush, MD;  Location: [place] [facility];  Service: Cardiovascular;  Laterality: N/A;  . ESOPHAGOGASTRODUODENOSCOPY N/A 12/14/2018   Procedure: ESOPHAGOGASTRODUODENOSCOPY (EGD);  Surgeon: Virgel Manifold, MD;  Location: [facility] ENDOSCOPY;  Service: Endoscopy;  Laterality: N/A;  . ESOPHAGOGASTRODUODENOSCOPY (EGD) WITH PROPOFOL N/A 06/01/2016   Procedure: ESOPHAGOGASTRODUODENOSCOPY (EGD) WITH PROPOFOL;  Surgeon: Manya Silvas, MD;  Location: [facility] ENDOSCOPY;  Service: Endoscopy;  Laterality: N/A;  . HERNIA REPAIR     x2  . INTRAOCULAR LENS INSERTION    . LEFT HEART CATH AND CORONARY ANGIOGRAPHY N/A 05/09/2017   Procedure: Left Heart Cath and Coronary Angiography;  Surgeon: Yolonda Kida, MD;  Location: [place] [facility];  Service: Cardiovascular;  Laterality: N/A;  . LEFT HEART CATH AND CORONARY ANGIOGRAPHY N/A 12/08/2018   Procedure: LEFT HEART CATH AND CORONARY ANGIOGRAPHY and possible PCI and stent;  Surgeon: Yolonda Kida, MD;  Location: [place] [facility];  Service: Cardiovascular;  Laterality: N/A;  . LEFT HEART CATH AND CORONARY ANGIOGRAPHY N/A 12/29/2019   Procedure: LEFT HEART CATH AND CORONARY ANGIOGRAPHY;  Surgeon: Nelva Bush, MD;  Location: [place] [facility];  Service: Cardiovascular;  Laterality: N/A;  . NOSE SURGERY     submucous resection  . PROSTATE SURGERY  2002  . ROTATOR CUFF REPAIR Right      Social History        Tobacco Use  . Smoking status: Former Smoker    Years: 20.00    Types: Cigarettes    Quit date: 12/10/1976     Years since quitting: 43.5  . Smokeless tobacco: Never Used  Substance Use Topics  . Alcohol use: No  . Drug use: No          Family History  Problem Relation Age of Onset  . Arthritis Mother   . Stroke Maternal Grandfather   . Breast cancer Neg Hx   no bleeding or clotting disorders       Allergies  Allergen Reactions  . Gabapentin     Other reaction(s): Other (See Comments) Tremors  . Peanut-Containing Drug Products Anaphylaxis  . Penicillins Hives and Rash    Has patient had a PCN reaction causing immediate rash, facial/tongue/throat swelling, SOB or lightheadedness with hypotension: Yes Has patient had a PCN reaction causing severe rash involving mucus membranes or skin necrosis: No Has patient had a PCN reaction that required hospitalization No Has patient had a PCN reaction occurring within the last 10 years: No If all of the above answers are "NO", then may proceed with Cephalosporin use.  . Bee Venom Swelling  . Influenza Vaccines Hives  . Inh [Isoniazid] Hives  . Kenalog [Triamcinolone Acetonide] Hives  . Levaquin [Levofloxacin] Other (See Comments)    Tendon, ligament pain.   . Nalfon [Fenoprofen Calcium] Hives  . Naproxen   . Peanut Oil   . Nsaids Rash    Nalfon 600 Nalfon 600     REVIEW OF SYSTEMS(Negative unless checked)  Constitutional: [] ??Weight loss[] ??Fever[] ??Chills Cardiac:[] ??Chest pain[] ??Chest pressure[] ??Palpitations [] ??Shortness of breath when laying flat [x] ??Shortness of breath at rest [x] ??Shortness of breath with exertion. Vascular: [x] ??Pain in legs with walking[x] ??Pain in legsat rest[] ??Pain in legs when laying flat [x] ??Claudication [] ??Pain in feet when walking [] ??Pain in feet at rest [] ??Pain in feet when laying flat [] ??History of DVT [] ??Phlebitis [] ??Swelling in legs [] ??Varicose veins [] ??Non-healing ulcers Pulmonary: [] ??Uses home oxygen [] ??Productive  cough[] ??Hemoptysis [] ??Wheeze [] ??COPD [] ??Asthma Neurologic: [] ??Dizziness [] ??Blackouts [] ??Seizures [] ??  History of stroke [x] ??History of TIA[] ??Aphasia [] ??Temporary blindness[] ??Dysphagia [] ??Weaknessor numbness in arms [x] ??Weakness or numbnessin legs Musculoskeletal: [x] ??Arthritis [] ??Joint swelling [] ??Joint pain [] ??Low back pain Hematologic:[] ??Easy bruising[] ??Easy bleeding [] ??Hypercoagulable state [] ??Anemic [] ??Hepatitis Gastrointestinal:[] ??Blood in stool[] ??Vomiting blood[x] ??Gastroesophageal reflux/heartburn[x] ??Difficulty swallowing. Genitourinary: [] ??Chronic kidney disease [] ??Difficulturination [] ??Frequenturination [] ??Burning with urination[] ??Blood in urine Skin: [] ??Rashes [] ??Ulcers [] ??Wounds Psychological: [] ??History of anxiety[] ??History of major depression.    Physical Examination  Vitals:   12/20/20 1342  BP: (!) 143/69  Pulse: 60  Weight: 201 lb (91.2 kg)  Height: 5\' 6"  (1.676 m)   Body mass index is 32.44 kg/m. Gen:  WD/WN, NAD Head: [place]/AT, No temporalis wasting. Ear/Nose/Throat: Hearing grossly intact, nares w/o erythema or drainage, trachea midline Eyes: Conjunctiva clear. Sclera non-icteric Neck: Supple.  Soft right bruit  Pulmonary:  Good air movement, equal and clear to auscultation bilaterally.  Cardiac: Somewhat irregular Vascular:  Vessel Right Left  Radial Palpable Palpable                          PT  1+ palpable  1+ palpable  DP  1+ palpable  1+ palpable    Musculoskeletal: M/S 5/5 throughout.  No deformity or atrophy.  Mild lower edema. Neurologic: CN 2-12 intact. Sensation grossly intact in extremities.  Symmetrical.  Speech is fluent. Motor exam as listed above. Psychiatric: Judgment intact, Mood & affect appropriate for pt's clinical situation. Dermatologic: No rashes or ulcers noted.  No cellulitis or open wounds.      CBC Lab Results   Component Value Date   WBC 9.3 11/22/2020   HGB 12.9 (L) 11/22/2020   HCT 38.5 (L) 11/22/2020   MCV 91.4 11/22/2020   PLT 226 11/22/2020    BMET    Component Value Date/Time   NA 141 07/07/2020 1307   NA 139 11/03/2014 0711   K 4.4 07/07/2020 1307   K 4.6 11/03/2014 0711   CL 106 07/07/2020 1307   CL 104 11/03/2014 0711   CO2 24 07/07/2020 1307   CO2 28 11/03/2014 0711   GLUCOSE 194 (H) 04/18/2020 0303   GLUCOSE 234 (H) 11/03/2014 0711   BUN 52 (H) 04/18/2020 0303   BUN 43 (H) 11/03/2014 0711   CREATININE 1.91 (H) 04/18/2020 0303   CREATININE 1.68 (H) 11/03/2014 0711   CALCIUM 8.9 04/18/2020 0303   CALCIUM 8.9 11/03/2014 0711   GFRNONAA 33 (L) 04/18/2020 0303   GFRNONAA 43 (L) 11/03/2014 0711   GFRAA 38 (L) 04/18/2020 0303   GFRAA 52 (L) 11/03/2014 0711   CrCl cannot be calculated (Patient's most recent lab result is older than the maximum 21 days allowed.).  COAG Lab Results  Component Value Date   INR 1.0 12/29/2019   INR 0.9 12/14/2019   INR 0.98 12/05/2018    Radiology No results found.   Assessment/Plan Diabetes (HCC) blood glucose control important in reducing the progression of atherosclerotic disease. Also, involved in wound healing. On appropriate medications.   Essential hypertension blood pressure control important in reducing the progression of atherosclerotic disease. On appropriate oral medications.   Hyperlipidemia lipid control important in reducing the progression of atherosclerotic disease. Continue statin therapy  Symptomatic anemia Follows with hematology.  Recently restarted on B12.  CKD (chronic kidney disease) stage 4, GFR 15-29 ml/min (HCC) Avoid contrast unless absolutely necessary  PAD (peripheral artery disease) (HCC) No new symptoms.  To be checked again in about 6 months  Carotid stenosis Duplex findings today show velocities in the 40 to 59% range  on the right today which is a little lower than the 60 to 79%  velocities seen at his last visit.  Left carotid velocities remain in the 1 to 39% range.  No major changes.  I will plan to recheck him in 6 months with a carotid duplex when he comes back to check his ABIs.  Continue current medical regimen    Leotis Pain, MD  12/20/2020 2:21 PM    This note was created with Dragon medical transcription system.  Any errors from dictation are purely unintentional

## 2020-12-20 NOTE — Assessment & Plan Note (Signed)
Duplex findings today show velocities in the 40 to 59% range on the right today which is a little lower than the 60 to 79% velocities seen at his last visit.  Left carotid velocities remain in the 1 to 39% range.  No major changes.  I will plan to recheck him in 6 months with a carotid duplex when he comes back to check his ABIs.  Continue current medical regimen

## 2020-12-30 ENCOUNTER — Other Ambulatory Visit: Payer: Self-pay | Admitting: Hospice and Palliative Medicine

## 2020-12-30 NOTE — Progress Notes (Unsigned)
f °

## 2021-01-02 ENCOUNTER — Ambulatory Visit: Payer: Medicare Other | Admitting: Internal Medicine

## 2021-01-02 DIAGNOSIS — Z23 Encounter for immunization: Secondary | ICD-10-CM | POA: Diagnosis not present

## 2021-01-02 DIAGNOSIS — R002 Palpitations: Secondary | ICD-10-CM | POA: Diagnosis not present

## 2021-01-02 DIAGNOSIS — G4733 Obstructive sleep apnea (adult) (pediatric): Secondary | ICD-10-CM | POA: Diagnosis not present

## 2021-01-02 DIAGNOSIS — I6529 Occlusion and stenosis of unspecified carotid artery: Secondary | ICD-10-CM | POA: Diagnosis not present

## 2021-01-02 DIAGNOSIS — R0602 Shortness of breath: Secondary | ICD-10-CM | POA: Diagnosis not present

## 2021-01-02 DIAGNOSIS — I2510 Atherosclerotic heart disease of native coronary artery without angina pectoris: Secondary | ICD-10-CM | POA: Diagnosis not present

## 2021-01-02 DIAGNOSIS — J449 Chronic obstructive pulmonary disease, unspecified: Secondary | ICD-10-CM | POA: Diagnosis not present

## 2021-01-02 DIAGNOSIS — I209 Angina pectoris, unspecified: Secondary | ICD-10-CM | POA: Diagnosis not present

## 2021-01-02 DIAGNOSIS — E782 Mixed hyperlipidemia: Secondary | ICD-10-CM | POA: Diagnosis not present

## 2021-01-02 DIAGNOSIS — R000 Tachycardia, unspecified: Secondary | ICD-10-CM | POA: Diagnosis not present

## 2021-01-02 DIAGNOSIS — N184 Chronic kidney disease, stage 4 (severe): Secondary | ICD-10-CM | POA: Diagnosis not present

## 2021-01-02 DIAGNOSIS — E119 Type 2 diabetes mellitus without complications: Secondary | ICD-10-CM | POA: Diagnosis not present

## 2021-01-09 ENCOUNTER — Other Ambulatory Visit: Payer: Self-pay | Admitting: Internal Medicine

## 2021-01-11 DIAGNOSIS — N2581 Secondary hyperparathyroidism of renal origin: Secondary | ICD-10-CM | POA: Diagnosis not present

## 2021-01-11 DIAGNOSIS — I129 Hypertensive chronic kidney disease with stage 1 through stage 4 chronic kidney disease, or unspecified chronic kidney disease: Secondary | ICD-10-CM | POA: Diagnosis not present

## 2021-01-11 DIAGNOSIS — E1122 Type 2 diabetes mellitus with diabetic chronic kidney disease: Secondary | ICD-10-CM | POA: Diagnosis not present

## 2021-01-11 DIAGNOSIS — R8290 Unspecified abnormal findings in urine: Secondary | ICD-10-CM | POA: Diagnosis not present

## 2021-01-11 DIAGNOSIS — N184 Chronic kidney disease, stage 4 (severe): Secondary | ICD-10-CM | POA: Diagnosis not present

## 2021-01-17 ENCOUNTER — Encounter: Payer: Self-pay | Admitting: Internal Medicine

## 2021-01-17 ENCOUNTER — Ambulatory Visit (INDEPENDENT_AMBULATORY_CARE_PROVIDER_SITE_OTHER): Payer: Medicare Other | Admitting: Internal Medicine

## 2021-01-17 ENCOUNTER — Other Ambulatory Visit: Payer: Self-pay

## 2021-01-17 VITALS — BP 140/60 | HR 57 | Ht 66.0 in | Wt 200.5 lb

## 2021-01-17 DIAGNOSIS — J449 Chronic obstructive pulmonary disease, unspecified: Secondary | ICD-10-CM

## 2021-01-17 DIAGNOSIS — I5032 Chronic diastolic (congestive) heart failure: Secondary | ICD-10-CM

## 2021-01-17 DIAGNOSIS — I6523 Occlusion and stenosis of bilateral carotid arteries: Secondary | ICD-10-CM

## 2021-01-17 DIAGNOSIS — I700 Atherosclerosis of aorta: Secondary | ICD-10-CM | POA: Diagnosis not present

## 2021-01-17 DIAGNOSIS — K22711 Barrett's esophagus with high grade dysplasia: Secondary | ICD-10-CM

## 2021-01-17 MED ORDER — ENTRESTO 24-26 MG PO TABS: 1.0000 | ORAL_TABLET | Freq: Two times a day (BID) | ORAL | 4 refills | Status: DC

## 2021-01-17 MED ORDER — ENTRESTO 24-26 MG PO TABS: 1.0000 | ORAL_TABLET | Freq: Two times a day (BID) | ORAL | 3 refills | Status: DC

## 2021-01-17 NOTE — Assessment & Plan Note (Signed)
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  Under care of her pulmonologist.

## 2021-01-17 NOTE — Progress Notes (Signed)
Established Patient Office Visit  Subjective:  Patient ID: Mike Decker, male    DOB: [birth year]  Age: 80 y.o. MRN: [id number]  CC:  Chief Complaint  Patient presents with  . Follow-up    No complaints     HPI  Mike Decker presents for general checkup.  Patient is known to have anemia irregular heartbeat bilateral esophagus and chronic bronchitis.  He is also known to have coronary artery disease with congestive heart failure.  Diverticulitis is stable at the present time  Past Medical History:  Diagnosis Date  . Anemia   . Arthritis   . Atrioventricular canal (AVC)    irregular heart beats  . Barrett esophagus   . Bronchiolitis   . 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 Chronic diastolic CHF (congestive heart failure) ([place])   . Colon polyp   . Diabetes mellitus without complication ([place])   . Diverticulosis   . Gout   . Heart disease   . Hemangioma    liver  . Hyperlipidemia   . Hypertension   . Myocardial infarct ([place])   . Ocular hypertension   . Peripheral vascular disease ([place])   . Skin cancer   . 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[place])   . Sleep apnea   . Vitreoretinal degeneration     Past Surgical History:  Procedure Laterality Date  . CATARACT EXTRACTION  2011, 2012  . COLONOSCOPY N/A 12/14/2018   Procedure: COLONOSCOPY;  Surgeon: Virgel Manifold, MD;  Location: [facility] ENDOSCOPY;  Service: Endoscopy;  Laterality: N/A;  . CORONARY ANGIOPLASTY WITH STENT PLACEMENT  2012  . CORONARY STENT INTERVENTION N/A 12/29/2019   Procedure: CORONARY STENT INTERVENTION;  Surgeon: Nelva Bush, MD;  Location: [place] [facility];  Service: Cardiovascular;  Laterality: N/A;  . ESOPHAGOGASTRODUODENOSCOPY N/A 12/14/2018   Procedure: ESOPHAGOGASTRODUODENOSCOPY (EGD);  Surgeon: Virgel Manifold, MD;  Location: [facility] END[facility]COPY;  Service: Endoscopy;  Laterality: N/A;  . ESOPHAGOGASTRODUODENOSCOPY (EGD) WITH PROPOFOL N/A 06/01/2016   Procedure: ESOPHAGOGASTRODUODENOSCOPY  (EGD) WITH PROPOFOL;  Surgeon: Manya Silvas, MD;  Location: [facility] ENDOSCOPY;  Service: Endoscopy;  Laterality: N/A;  . HERNIA REPAIR     x2  . INTRAOCULAR LENS INSERTION    . LEFT HEART CATH AND CORONARY ANGIOGRAPHY N/A 05/09/2017   Procedure: Left Heart Cath and Coronary Angiography;  Surgeon: Yolonda Kida, MD;  Location: [place] [facility];  Service: Cardiovascular;  Laterality: N/A;  . LEFT HEART CATH AND CORONARY ANGIOGRAPHY N/A 12/08/2018   Procedure: LEFT HEART CATH AND CORONARY ANGIOGRAPHY and possible PCI and stent;  Surgeon: Yolonda Kida, MD;  Location: [place] [facility];  Service: Cardiovascular;  Laterality: N/A;  . LEFT HEART CATH AND CORONARY ANGIOGRAPHY N/A 12/29/2019   Procedure: LEFT HEART CATH AND CORONARY ANGIOGRAPHY;  Surgeon: Nelva Bush, MD;  Location: [place] [facility];  Service: Cardiovascular;  Laterality: N/A;  . NOSE SURGERY     submucous resection  . PROSTATE SURGERY  2002  . ROTATOR CUFF REPAIR Right     Family History  Problem Relation Age of Onset  . Arthritis Mother   . Stroke Maternal Grandfather   . Breast cancer Neg Hx     Social History   Socioeconomic History  . Marital status: Married    Spouse name: Not on file  . Number of children: Not on file  . Years of education: Not on file  . Highest education level: Not on file  Occupational History  . Not on  file  Tobacco Use  . Smoking status: Former Smoker    Years: 20.00    Types: Cigarettes    Quit date: 12/10/1976    Years since quitting: 44.1  . Smokeless tobacco: Never Used  Substance and Sexual Activity  . Alcohol use: No  . Drug use: No  . Sexual activity: Not on file  Other Topics Concern  . Not on file  Social History Narrative  . Not on file   Social Determinants of Health   Financial Resource Strain: Not on file  Food Insecurity: Not on file  Transportation Needs: Not on file  Physical Activity: Not on file  Stress: Not on file  Social  Connections: Not on file  Intimate Partner Violence: Not on file     Current Outpatient Medications:  .  acetaminophen (TYLENOL) 500 MG tablet, Take 1,000 mg by mouth every 6 (six) hours as needed for mild pain, moderate pain or headache., Disp: , Rfl:  .  allopurinol (ZYLOPRIM) 100 MG tablet, Take 100 mg by mouth daily. , Disp: , Rfl:  .  aspirin EC 81 MG EC tablet, Take 1 tablet (81 mg total) by mouth daily., Disp: 60 tablet, Rfl: 0 .  azelastine (ASTELIN) 0.1 % nasal spray, USE 1 TO 2 SPRAYS IN EACH NOSTRIL TWICE A DAY, Disp: , Rfl:  .  azithromycin (ZITHROMAX) 250 MG tablet, Take 1 tablet daily for 14 days, Disp: 14 tablet, Rfl: 0 .  budesonide-formoterol (SYMBICORT) 160-4.5 MCG/ACT inhaler, Inhale 2 puffs into the lungs 2 (two) times daily as needed. , Disp: , Rfl:  .  cetirizine (ZYRTEC) 5 MG tablet, Take 5 mg by mouth daily as needed for allergies or rhinitis. , Disp: , Rfl:  .  chlorpheniramine-HYDROcodone (TUSSIONEX PENNKINETIC ER) 10-8 MG/5ML SUER, Take 5 mLs by mouth every 12 (twelve) hours as needed for cough., Disp: 140 mL, Rfl: 0 .  citalopram (CELEXA) 10 MG tablet, TAKE 1 TABLET EVERY DAY, Disp: 90 tablet, Rfl: 3 .  colchicine 0.6 MG tablet, Take 3 tablets (1.8 mg total) by mouth daily as needed (gout flares)., Disp: , Rfl:  .  dextromethorphan-guaiFENesin (MUCINEX DM) 30-600 MG 12hr tablet, Take 1 tablet by mouth 2 (two) times daily as needed for cough., Disp: , Rfl:  .  doxycycline (VIBRA-TABS) 100 MG tablet, Take 1 tablet (100 mg total) by mouth 2 (two) times daily., Disp: 20 tablet, Rfl: 0 .  EPINEPHrine (EPI-PEN) 0.3 mg/0.3 mL SOAJ injection, Inject into the muscle as needed (anaphylaxis). , Disp: , Rfl:  .  ferrous sulfate 325 (65 FE) MG EC tablet, Take 1 tablet (325 mg total) by mouth daily., Disp: 90 tablet, Rfl: 0 .  fluticasone (FLONASE) 50 MCG/ACT nasal spray, USE 1 SPRAY IN EACH NOSTRIL EVERY DAY, Disp: 32 g, Rfl: 6 .  Fluticasone-Umeclidin-Vilant (TRELEGY ELLIPTA)  100-62.5-25 MCG/INH AEPB, Inhale 1 puff into the lungs daily., Disp: 3 each, Rfl: 4 .  folic acid (FOLVITE) 025 MCG tablet, TAKE 1 TABLET (400 MCG TOTAL) BY MOUTH DAILY., Disp: 100 tablet, Rfl: 3 .  furosemide (LASIX) 20 MG tablet, Take 2 tablets (40 mg total) by mouth daily. (Patient taking differently: Take 60 mg by mouth 2 (two) times daily. Takes 40 mg in morning and if needed takes 40 mg in the evening), Disp: , Rfl:  .  insulin aspart (NOVOLOG) 100 UNIT/ML FlexPen, 14 UNITS IN AM, 18 UNITS AT LUNCH, 24 UNITS AT DINNER, Disp: , Rfl:  .  insulin glargine (LANTUS) 100 UNIT/ML  injection, Inject 0.35 mLs (35 Units total) into the skin at bedtime. This is a decrease from your previous 45 units nightly., Disp: 10 mL, Rfl: 11 .  ipratropium-albuterol (DUONEB) 0.5-2.5 (3) MG/3ML SOLN, Take 3 mLs by nebulization every 4 (four) hours as needed., Disp: 360 mL, Rfl: 0 .  isosorbide mononitrate (IMDUR) 60 MG 24 hr tablet, Take 120 mg by mouth daily. , Disp: , Rfl:  .  methocarbamol (ROBAXIN) 500 MG tablet, Take 500 mg by mouth 2 (two) times daily. , Disp: , Rfl: 0 .  metoprolol tartrate (LOPRESSOR) 50 MG tablet, Take 50 mg by mouth 3 (three) times daily. , Disp: , Rfl:  .  montelukast (SINGULAIR) 10 MG tablet, TAKE 1 TABLET BY MOUTH EVERYDAY AT BEDTIME, Disp: 90 tablet, Rfl: 1 .  nitroGLYCERIN (NITROSTAT) 0.4 MG SL tablet, Place 0.4 mg under the tongue every 5 (five) minutes x 3 doses as needed for chest pain. , Disp: , Rfl:  .  nystatin (MYCOSTATIN) 100000 UNIT/ML suspension, Take 5 mLs (500,000 Units total) by mouth 4 (four) times daily. Swish and swallow 3 times daily for 10 days, Disp: 60 mL, Rfl: 0 .  nystatin (MYCOSTATIN) 100000 UNIT/ML suspension, Take 5 mLs (500,000 Units total) by mouth 4 (four) times daily., Disp: 60 mL, Rfl: 0 .  Omega-3 Fatty Acids (FISH OIL) 1000 MG CAPS, Take 1 capsule by mouth daily. , Disp: , Rfl:  .  pantoprazole (PROTONIX) 40 MG tablet, Take 1 tablet (40 mg total) by mouth 2  (two) times daily before a meal., Disp: , Rfl:  .  predniSONE (DELTASONE) 10 MG tablet, Take 2 tablets daily for 1 week followed by 1 tablet daily for 1 week., Disp: 21 tablet, Rfl: 0 .  rosuvastatin (CRESTOR) 40 MG tablet, Take 40 mg by mouth every evening. , Disp: , Rfl:  .  sacubitril-valsartan (ENTRESTO) 24-26 MG, Take 1 tablet by mouth 2 (two) times daily., Disp: 180 tablet, Rfl: 3 .  sodium bicarbonate 650 MG tablet, Take 650 mg by mouth 2 (two) times daily., Disp: , Rfl:  .  vitamin B-12 (CYANOCOBALAMIN) 1000 MCG tablet, Take 1 tablet (1,000 mcg total) by mouth daily., Disp: 90 tablet, Rfl: 3 .  vitamin C (ASCORBIC ACID) 250 MG tablet, Take 250 mg by mouth daily., Disp: , Rfl:  .  Vitamin D, Ergocalciferol, (DRISDOL) 50000 units CAPS capsule, Take 50,000 Units by mouth every 30 (thirty) days. , Disp: , Rfl: 1 .  zinc gluconate 50 MG tablet, Take 50 mg by mouth daily., Disp: , Rfl:    Allergies  Allergen Reactions  . Gabapentin     Other reaction(s): Other (See Comments) Tremors  . Peanut-Containing Drug Products Anaphylaxis  . Penicillins Hives and Rash    Has patient had a PCN reaction causing immediate rash, facial/tongue/throat swelling, SOB or lightheadedness with hypotension: Yes Has patient had a PCN reaction causing severe rash involving mucus membranes or skin necrosis: No Has patient had a PCN reaction that required hospitalization No Has patient had a PCN reaction occurring within the last 10 years: No If all of the above answers are "NO", then may proceed with Cephalosporin use.  . Bee Venom Swelling  . Influenza Vaccines Hives  . Inh [Isoniazid] Hives  . Kenalog [Triamcinolone Acetonide] Hives  . Levaquin [Levofloxacin] Other (See Comments)    Tendon, ligament pain.   . Nalfon [Fenoprofen Calcium] Hives  . Naproxen   . Peanut Oil   . Nsaids Rash  Nalfon 600 Nalfon 600    ROS Review of Systems  Constitutional: Positive for fatigue.  HENT: Negative.  Negative  for nosebleeds and sneezing.   Eyes: Negative.  Negative for itching.  Respiratory: Positive for cough, shortness of breath and wheezing.   Cardiovascular: Negative.  Negative for chest pain.  Gastrointestinal: Negative.  Negative for blood in stool.  Endocrine: Negative.   Genitourinary: Negative.   Musculoskeletal: Negative.  Negative for back pain.  Skin: Negative.   Allergic/Immunologic: Negative.   Neurological: Negative.  Negative for headaches.  Hematological: Negative.   Psychiatric/Behavioral: Negative.   All other systems reviewed and are negative.     Objective:    Physical Exam Vitals reviewed.  Constitutional:      Appearance: Normal appearance.  HENT:     Mouth/Throat:     Mouth: Mucous membranes are moist.  Eyes:     Pupils: Pupils are equal, round, and reactive to light.  Neck:     Vascular: No carotid bruit.  Cardiovascular:     Rate and Rhythm: Normal rate and regular rhythm.     Pulses: Normal pulses.     Heart sounds: Normal heart sounds.  Pulmonary:     Effort: Pulmonary effort is normal.     Breath sounds: Normal breath sounds.  Abdominal:     General: Bowel sounds are normal.     Palpations: Abdomen is soft. There is no hepatomegaly, splenomegaly or mass.     Tenderness: There is no abdominal tenderness.     Hernia: No hernia is present.  Musculoskeletal:     Cervical back: Neck supple.     Right lower leg: No edema.     Left lower leg: No edema.  Skin:    Findings: No rash.  Neurological:     Mental Status: He is alert and oriented to person, place, and time.     Motor: No weakness.  Psychiatric:        Mood and Affect: Mood normal.        Behavior: Behavior normal.     BP 140/60   Pulse (!) 57   Ht 5\' 6"  (1.676 m)   Wt 200 lb 8 oz (90.9 kg)   BMI 32.36 kg/m  Wt Readings from Last 3 Encounters:  01/17/21 200 lb 8 oz (90.9 kg)  12/20/20 201 lb (91.2 kg)  11/24/20 205 lb (93 kg)     Health Maintenance Due  Topic Date Due  .  FOOT EXAM  Never done  . URINE MICROALBUMIN  Never done  . COVID-19 Vaccine (3 - Pfizer risk 4-dose series) 03/07/2020  . HEMOGLOBIN A1C  06/13/2020    There are no preventive care reminders to display for this patient.  Lab Results  Component Value Date   TSH 1.948 12/06/2018   Lab Results  Component Value Date   WBC 9.3 11/22/2020   HGB 12.9 (L) 11/22/2020   HCT 38.5 (L) 11/22/2020   MCV 91.4 11/22/2020   PLT 226 11/22/2020   Lab Results  Component Value Date   NA 141 07/07/2020   K 4.4 07/07/2020   CO2 24 07/07/2020   GLUCOSE 194 (H) 04/18/2020   BUN 52 (H) 04/18/2020   CREATININE 1.91 (H) 04/18/2020   BILITOT 0.5 04/18/2020   ALKPHOS 65 04/18/2020   AST 19 04/18/2020   ALT 12 04/18/2020   PROT 7.4 04/18/2020   ALBUMIN 4.3 04/18/2020   CALCIUM 8.9 04/18/2020   ANIONGAP 11 07/07/2020   Lab Results  Component Value Date   CHOL 115 12/29/2019   Lab Results  Component Value Date   HDL 31 (L) 12/29/2019   Lab Results  Component Value Date   LDLCALC 51 12/29/2019   Lab Results  Component Value Date   TRIG 163 (H) 12/29/2019   Lab Results  Component Value Date   CHOLHDL 3.7 12/29/2019   Lab Results  Component Value Date   HGBA1C 8.7 (H) 12/15/2019      Assessment & Plan:   Problem List Items Addressed This Visit      Cardiovascular and Mediastinum   Chronic diastolic heart failure ([place]) (Chronic)    Patient was started on Entresto 1 tablet twice a day.      Relevant Medications   sacubitril-valsartan (ENTRESTO) 24-26 MG   Aortic atherosclerosis ([place]) - Primary    Patient patient was advised to continue taking statin      Relevant Medications   sacubitril-valsartan (ENTRESTO) 24-26 MG     Respiratory   Chronic obstructive pulmonary disease ([place])    Patient has COPD secondary to smoking in the past.  Under care of her pulmonologist.        Digestive   Barrett's esophagus    - The patient's GERD is stable on medication.  - Instructed  the patient to avoid eating spicy and acidic foods, as well as foods high in fat. - Instructed the patient to avoid eating large meals or meals 2-3 hours prior to sleeping.         Meds ordered this encounter  Medications  . DISCONTD: sacubitril-valsartan (ENTRESTO) 24-26 MG    Sig: Take 1 tablet by mouth 2 (two) times daily.    Dispense:  60 tablet    Refill:  4  . sacubitril-valsartan (ENTRESTO) 24-26 MG    Sig: Take 1 tablet by mouth 2 (two) times daily.    Dispense:  180 tablet    Refill:  3    Follow-up: No follow-ups on file.    Cletis Athens, MD

## 2021-01-17 NOTE — Assessment & Plan Note (Signed)
Patient was started on Entresto 1 tablet twice a day.

## 2021-01-17 NOTE — Assessment & Plan Note (Signed)
Patient patient was advised to continue taking statin

## 2021-01-17 NOTE — Assessment & Plan Note (Signed)
-   The patient's GERD is stable on medication.  - Instructed the patient to avoid eating spicy and acidic foods, as well as foods high in fat. - Instructed the patient to avoid eating large meals or meals 2-3 hours prior to sleeping. 

## 2021-01-19 DIAGNOSIS — E1142 Type 2 diabetes mellitus with diabetic polyneuropathy: Secondary | ICD-10-CM | POA: Diagnosis not present

## 2021-01-27 DIAGNOSIS — Z8719 Personal history of other diseases of the digestive system: Secondary | ICD-10-CM | POA: Diagnosis not present

## 2021-01-27 DIAGNOSIS — E1129 Type 2 diabetes mellitus with other diabetic kidney complication: Secondary | ICD-10-CM | POA: Diagnosis not present

## 2021-01-27 DIAGNOSIS — N1832 Chronic kidney disease, stage 3b: Secondary | ICD-10-CM | POA: Diagnosis not present

## 2021-01-27 DIAGNOSIS — R809 Proteinuria, unspecified: Secondary | ICD-10-CM | POA: Diagnosis not present

## 2021-01-27 DIAGNOSIS — E1142 Type 2 diabetes mellitus with diabetic polyneuropathy: Secondary | ICD-10-CM | POA: Diagnosis not present

## 2021-01-27 DIAGNOSIS — E1159 Type 2 diabetes mellitus with other circulatory complications: Secondary | ICD-10-CM | POA: Diagnosis not present

## 2021-01-27 DIAGNOSIS — E1122 Type 2 diabetes mellitus with diabetic chronic kidney disease: Secondary | ICD-10-CM | POA: Diagnosis not present

## 2021-01-27 DIAGNOSIS — Z794 Long term (current) use of insulin: Secondary | ICD-10-CM | POA: Diagnosis not present

## 2021-01-27 DIAGNOSIS — E1165 Type 2 diabetes mellitus with hyperglycemia: Secondary | ICD-10-CM | POA: Diagnosis not present

## 2021-02-02 DIAGNOSIS — I2510 Atherosclerotic heart disease of native coronary artery without angina pectoris: Secondary | ICD-10-CM | POA: Diagnosis not present

## 2021-02-02 DIAGNOSIS — R0602 Shortness of breath: Secondary | ICD-10-CM | POA: Diagnosis not present

## 2021-02-02 DIAGNOSIS — E782 Mixed hyperlipidemia: Secondary | ICD-10-CM | POA: Diagnosis not present

## 2021-02-02 DIAGNOSIS — R000 Tachycardia, unspecified: Secondary | ICD-10-CM | POA: Diagnosis not present

## 2021-02-02 DIAGNOSIS — N184 Chronic kidney disease, stage 4 (severe): Secondary | ICD-10-CM | POA: Diagnosis not present

## 2021-02-02 DIAGNOSIS — R002 Palpitations: Secondary | ICD-10-CM | POA: Diagnosis not present

## 2021-02-02 DIAGNOSIS — I6529 Occlusion and stenosis of unspecified carotid artery: Secondary | ICD-10-CM | POA: Diagnosis not present

## 2021-02-02 DIAGNOSIS — E119 Type 2 diabetes mellitus without complications: Secondary | ICD-10-CM | POA: Diagnosis not present

## 2021-02-02 DIAGNOSIS — G4733 Obstructive sleep apnea (adult) (pediatric): Secondary | ICD-10-CM | POA: Diagnosis not present

## 2021-02-02 DIAGNOSIS — I10 Essential (primary) hypertension: Secondary | ICD-10-CM | POA: Diagnosis not present

## 2021-02-02 DIAGNOSIS — J449 Chronic obstructive pulmonary disease, unspecified: Secondary | ICD-10-CM | POA: Diagnosis not present

## 2021-02-02 DIAGNOSIS — I209 Angina pectoris, unspecified: Secondary | ICD-10-CM | POA: Diagnosis not present

## 2021-02-05 ENCOUNTER — Other Ambulatory Visit: Payer: Self-pay | Admitting: Oncology

## 2021-02-09 DIAGNOSIS — I2510 Atherosclerotic heart disease of native coronary artery without angina pectoris: Secondary | ICD-10-CM | POA: Diagnosis not present

## 2021-02-09 DIAGNOSIS — I6529 Occlusion and stenosis of unspecified carotid artery: Secondary | ICD-10-CM | POA: Diagnosis not present

## 2021-02-09 DIAGNOSIS — I25110 Atherosclerotic heart disease of native coronary artery with unstable angina pectoris: Secondary | ICD-10-CM | POA: Diagnosis not present

## 2021-02-09 DIAGNOSIS — R000 Tachycardia, unspecified: Secondary | ICD-10-CM | POA: Diagnosis not present

## 2021-02-09 DIAGNOSIS — I214 Non-ST elevation (NSTEMI) myocardial infarction: Secondary | ICD-10-CM | POA: Diagnosis not present

## 2021-02-09 DIAGNOSIS — G4733 Obstructive sleep apnea (adult) (pediatric): Secondary | ICD-10-CM | POA: Diagnosis not present

## 2021-02-09 DIAGNOSIS — D2271 Melanocytic nevi of right lower limb, including hip: Secondary | ICD-10-CM | POA: Diagnosis not present

## 2021-02-09 DIAGNOSIS — N184 Chronic kidney disease, stage 4 (severe): Secondary | ICD-10-CM | POA: Diagnosis not present

## 2021-02-09 DIAGNOSIS — D2262 Melanocytic nevi of left upper limb, including shoulder: Secondary | ICD-10-CM | POA: Diagnosis not present

## 2021-02-09 DIAGNOSIS — R002 Palpitations: Secondary | ICD-10-CM | POA: Diagnosis not present

## 2021-02-09 DIAGNOSIS — D2261 Melanocytic nevi of right upper limb, including shoulder: Secondary | ICD-10-CM | POA: Diagnosis not present

## 2021-02-09 DIAGNOSIS — E119 Type 2 diabetes mellitus without complications: Secondary | ICD-10-CM | POA: Diagnosis not present

## 2021-02-09 DIAGNOSIS — R0602 Shortness of breath: Secondary | ICD-10-CM | POA: Diagnosis not present

## 2021-02-09 DIAGNOSIS — Z85828 Personal history of other malignant neoplasm of skin: Secondary | ICD-10-CM | POA: Diagnosis not present

## 2021-02-09 DIAGNOSIS — E782 Mixed hyperlipidemia: Secondary | ICD-10-CM | POA: Diagnosis not present

## 2021-02-09 DIAGNOSIS — I200 Unstable angina: Secondary | ICD-10-CM | POA: Diagnosis not present

## 2021-02-09 DIAGNOSIS — Z85820 Personal history of malignant melanoma of skin: Secondary | ICD-10-CM | POA: Diagnosis not present

## 2021-02-09 DIAGNOSIS — D225 Melanocytic nevi of trunk: Secondary | ICD-10-CM | POA: Diagnosis not present

## 2021-02-12 DIAGNOSIS — I130 Hypertensive heart and chronic kidney disease with heart failure and stage 1 through stage 4 chronic kidney disease, or unspecified chronic kidney disease: Secondary | ICD-10-CM | POA: Diagnosis not present

## 2021-02-12 DIAGNOSIS — Z8673 Personal history of transient ischemic attack (TIA), and cerebral infarction without residual deficits: Secondary | ICD-10-CM | POA: Diagnosis not present

## 2021-02-12 DIAGNOSIS — I129 Hypertensive chronic kidney disease with stage 1 through stage 4 chronic kidney disease, or unspecified chronic kidney disease: Secondary | ICD-10-CM | POA: Diagnosis not present

## 2021-02-12 DIAGNOSIS — Z9861 Coronary angioplasty status: Secondary | ICD-10-CM | POA: Diagnosis not present

## 2021-02-12 DIAGNOSIS — Z7902 Long term (current) use of antithrombotics/antiplatelets: Secondary | ICD-10-CM | POA: Diagnosis not present

## 2021-02-12 DIAGNOSIS — I25118 Atherosclerotic heart disease of native coronary artery with other forms of angina pectoris: Secondary | ICD-10-CM | POA: Diagnosis not present

## 2021-02-12 DIAGNOSIS — I25110 Atherosclerotic heart disease of native coronary artery with unstable angina pectoris: Secondary | ICD-10-CM | POA: Diagnosis not present

## 2021-02-12 DIAGNOSIS — Z91010 Allergy to peanuts: Secondary | ICD-10-CM | POA: Diagnosis not present

## 2021-02-12 DIAGNOSIS — Z7982 Long term (current) use of aspirin: Secondary | ICD-10-CM | POA: Diagnosis not present

## 2021-02-12 DIAGNOSIS — J449 Chronic obstructive pulmonary disease, unspecified: Secondary | ICD-10-CM | POA: Diagnosis not present

## 2021-02-12 DIAGNOSIS — Z87892 Personal history of anaphylaxis: Secondary | ICD-10-CM | POA: Diagnosis not present

## 2021-02-12 DIAGNOSIS — Z8501 Personal history of malignant neoplasm of esophagus: Secondary | ICD-10-CM | POA: Diagnosis not present

## 2021-02-12 DIAGNOSIS — I6529 Occlusion and stenosis of unspecified carotid artery: Secondary | ICD-10-CM | POA: Diagnosis not present

## 2021-02-12 DIAGNOSIS — Z880 Allergy status to penicillin: Secondary | ICD-10-CM | POA: Diagnosis not present

## 2021-02-12 DIAGNOSIS — Z87891 Personal history of nicotine dependence: Secondary | ICD-10-CM | POA: Diagnosis not present

## 2021-02-12 DIAGNOSIS — G4733 Obstructive sleep apnea (adult) (pediatric): Secondary | ICD-10-CM | POA: Diagnosis not present

## 2021-02-12 DIAGNOSIS — Z20822 Contact with and (suspected) exposure to covid-19: Secondary | ICD-10-CM | POA: Diagnosis not present

## 2021-02-12 DIAGNOSIS — E782 Mixed hyperlipidemia: Secondary | ICD-10-CM | POA: Diagnosis not present

## 2021-02-12 DIAGNOSIS — E1122 Type 2 diabetes mellitus with diabetic chronic kidney disease: Secondary | ICD-10-CM | POA: Diagnosis not present

## 2021-02-12 DIAGNOSIS — I340 Nonrheumatic mitral (valve) insufficiency: Secondary | ICD-10-CM | POA: Diagnosis not present

## 2021-02-12 DIAGNOSIS — I509 Heart failure, unspecified: Secondary | ICD-10-CM | POA: Diagnosis not present

## 2021-02-12 DIAGNOSIS — Z955 Presence of coronary angioplasty implant and graft: Secondary | ICD-10-CM | POA: Diagnosis not present

## 2021-02-12 DIAGNOSIS — N184 Chronic kidney disease, stage 4 (severe): Secondary | ICD-10-CM | POA: Diagnosis not present

## 2021-02-12 DIAGNOSIS — E1151 Type 2 diabetes mellitus with diabetic peripheral angiopathy without gangrene: Secondary | ICD-10-CM | POA: Diagnosis not present

## 2021-02-12 DIAGNOSIS — I4891 Unspecified atrial fibrillation: Secondary | ICD-10-CM | POA: Diagnosis not present

## 2021-02-12 DIAGNOSIS — I5032 Chronic diastolic (congestive) heart failure: Secondary | ICD-10-CM | POA: Diagnosis not present

## 2021-02-12 DIAGNOSIS — Z794 Long term (current) use of insulin: Secondary | ICD-10-CM | POA: Diagnosis not present

## 2021-02-12 DIAGNOSIS — E785 Hyperlipidemia, unspecified: Secondary | ICD-10-CM | POA: Diagnosis not present

## 2021-02-12 DIAGNOSIS — Z8546 Personal history of malignant neoplasm of prostate: Secondary | ICD-10-CM | POA: Diagnosis not present

## 2021-02-12 DIAGNOSIS — Z85820 Personal history of malignant melanoma of skin: Secondary | ICD-10-CM | POA: Diagnosis not present

## 2021-02-28 ENCOUNTER — Ambulatory Visit: Payer: Medicare Other | Admitting: Internal Medicine

## 2021-03-02 DIAGNOSIS — Z955 Presence of coronary angioplasty implant and graft: Secondary | ICD-10-CM | POA: Diagnosis not present

## 2021-03-02 DIAGNOSIS — I10 Essential (primary) hypertension: Secondary | ICD-10-CM | POA: Diagnosis not present

## 2021-03-02 DIAGNOSIS — E782 Mixed hyperlipidemia: Secondary | ICD-10-CM | POA: Diagnosis not present

## 2021-03-02 DIAGNOSIS — I5033 Acute on chronic diastolic (congestive) heart failure: Secondary | ICD-10-CM | POA: Diagnosis not present

## 2021-03-02 DIAGNOSIS — Z8673 Personal history of transient ischemic attack (TIA), and cerebral infarction without residual deficits: Secondary | ICD-10-CM | POA: Diagnosis not present

## 2021-03-02 DIAGNOSIS — I700 Atherosclerosis of aorta: Secondary | ICD-10-CM | POA: Diagnosis not present

## 2021-03-02 DIAGNOSIS — G4733 Obstructive sleep apnea (adult) (pediatric): Secondary | ICD-10-CM | POA: Diagnosis not present

## 2021-03-02 DIAGNOSIS — R002 Palpitations: Secondary | ICD-10-CM | POA: Diagnosis not present

## 2021-03-02 DIAGNOSIS — I6523 Occlusion and stenosis of bilateral carotid arteries: Secondary | ICD-10-CM | POA: Diagnosis not present

## 2021-03-02 DIAGNOSIS — E119 Type 2 diabetes mellitus without complications: Secondary | ICD-10-CM | POA: Diagnosis not present

## 2021-03-02 DIAGNOSIS — I25118 Atherosclerotic heart disease of native coronary artery with other forms of angina pectoris: Secondary | ICD-10-CM | POA: Diagnosis not present

## 2021-03-02 DIAGNOSIS — N184 Chronic kidney disease, stage 4 (severe): Secondary | ICD-10-CM | POA: Diagnosis not present

## 2021-03-15 ENCOUNTER — Ambulatory Visit: Payer: Medicare Other | Admitting: Internal Medicine

## 2021-03-20 DIAGNOSIS — Z8719 Personal history of other diseases of the digestive system: Secondary | ICD-10-CM | POA: Diagnosis not present

## 2021-03-20 DIAGNOSIS — E1165 Type 2 diabetes mellitus with hyperglycemia: Secondary | ICD-10-CM | POA: Diagnosis not present

## 2021-03-21 ENCOUNTER — Ambulatory Visit: Payer: Medicare Other | Admitting: Internal Medicine

## 2021-03-27 DIAGNOSIS — R809 Proteinuria, unspecified: Secondary | ICD-10-CM | POA: Diagnosis not present

## 2021-03-27 DIAGNOSIS — E1142 Type 2 diabetes mellitus with diabetic polyneuropathy: Secondary | ICD-10-CM | POA: Diagnosis not present

## 2021-03-27 DIAGNOSIS — E1122 Type 2 diabetes mellitus with diabetic chronic kidney disease: Secondary | ICD-10-CM | POA: Diagnosis not present

## 2021-03-27 DIAGNOSIS — E1159 Type 2 diabetes mellitus with other circulatory complications: Secondary | ICD-10-CM | POA: Diagnosis not present

## 2021-03-27 DIAGNOSIS — E1129 Type 2 diabetes mellitus with other diabetic kidney complication: Secondary | ICD-10-CM | POA: Diagnosis not present

## 2021-03-27 DIAGNOSIS — N1832 Chronic kidney disease, stage 3b: Secondary | ICD-10-CM | POA: Diagnosis not present

## 2021-03-27 DIAGNOSIS — Z794 Long term (current) use of insulin: Secondary | ICD-10-CM | POA: Diagnosis not present

## 2021-03-30 ENCOUNTER — Ambulatory Visit: Payer: Medicare Other | Admitting: Internal Medicine

## 2021-04-01 IMAGING — MG DIGITAL DIAGNOSTIC BILATERAL MAMMOGRAM WITH TOMO AND CAD
8 series · 9 of 24 positions shown · non-contrast
Comparison: Previous exam(s).

CLINICAL DATA: Tenderness under the right nipple.

EXAM:
DIGITAL DIAGNOSTIC BILATERAL MAMMOGRAM WITH CAD AND TOMO

[L MLO synth-2D]
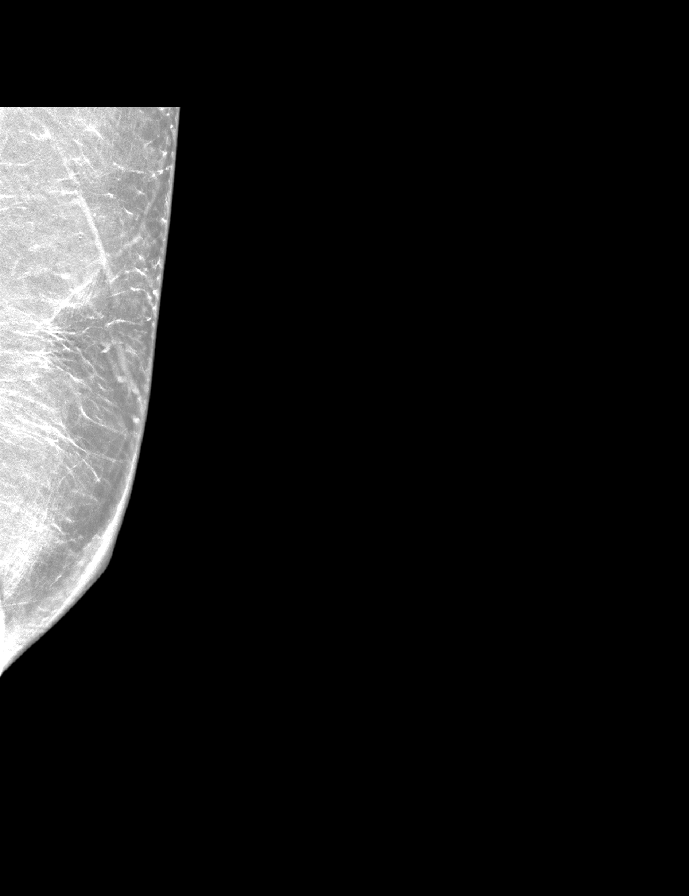

[R TAN synth-2D]
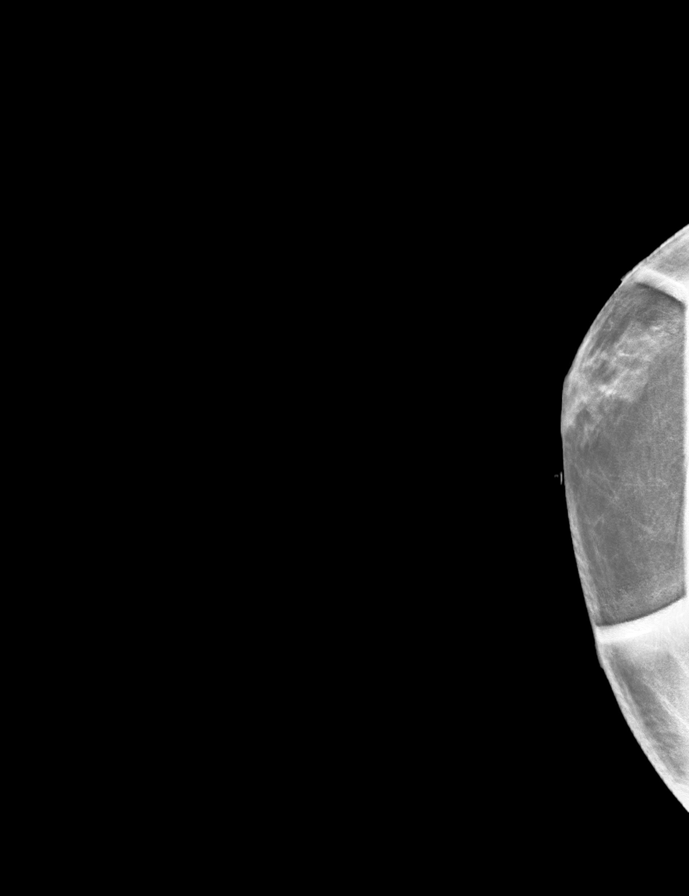

[R MLO synth-2D (1 of 2)]
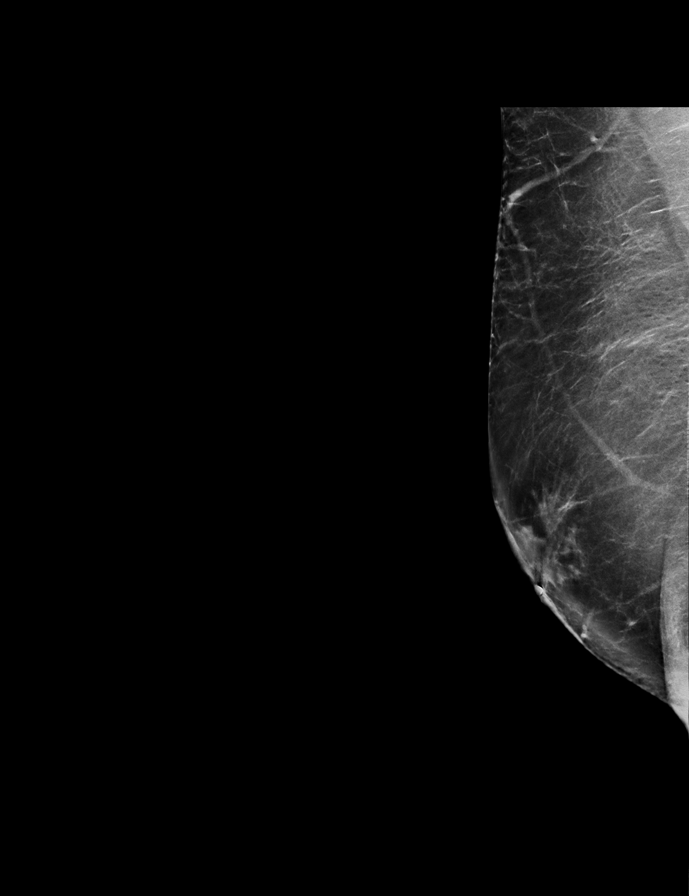

[R MLO synth-2D (2 of 2)]
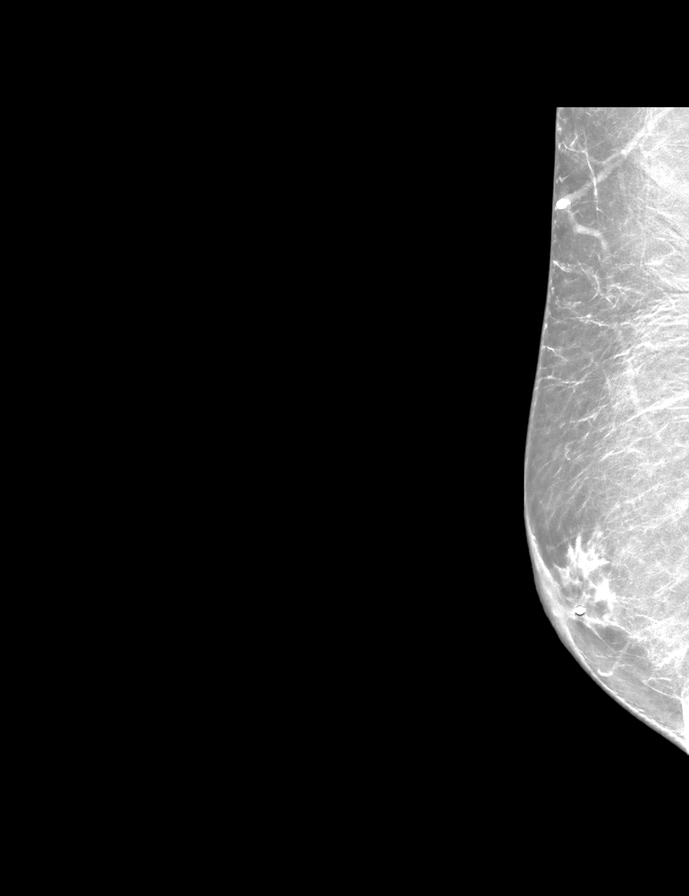

[R MLO tomo · 2 of 79 frames shown (1 of 2)]
[frame 26/79]
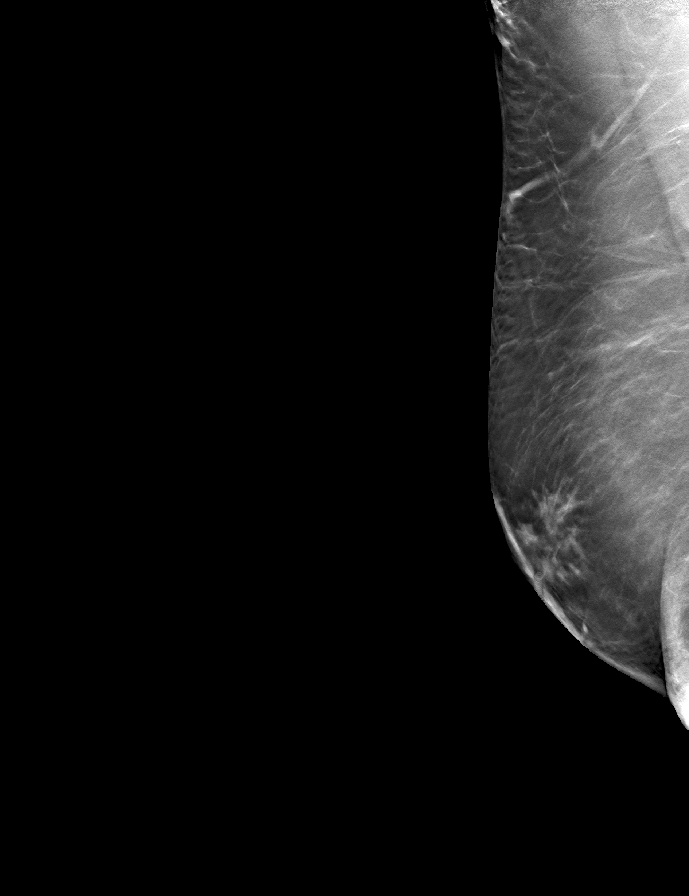
[frame 40/79]
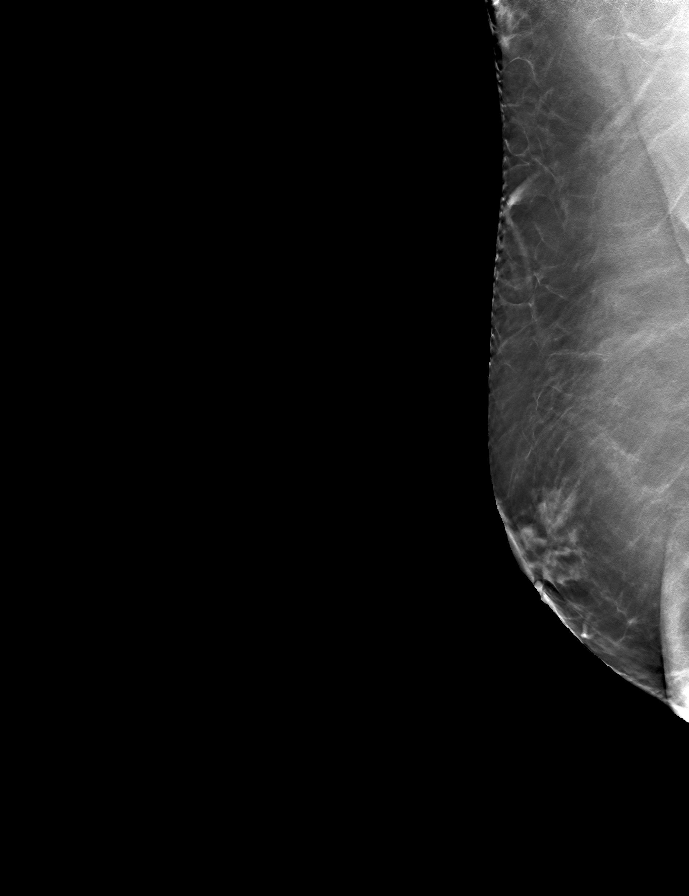

[R MLO tomo (2 of 2) · tomo slice 35/68.0]
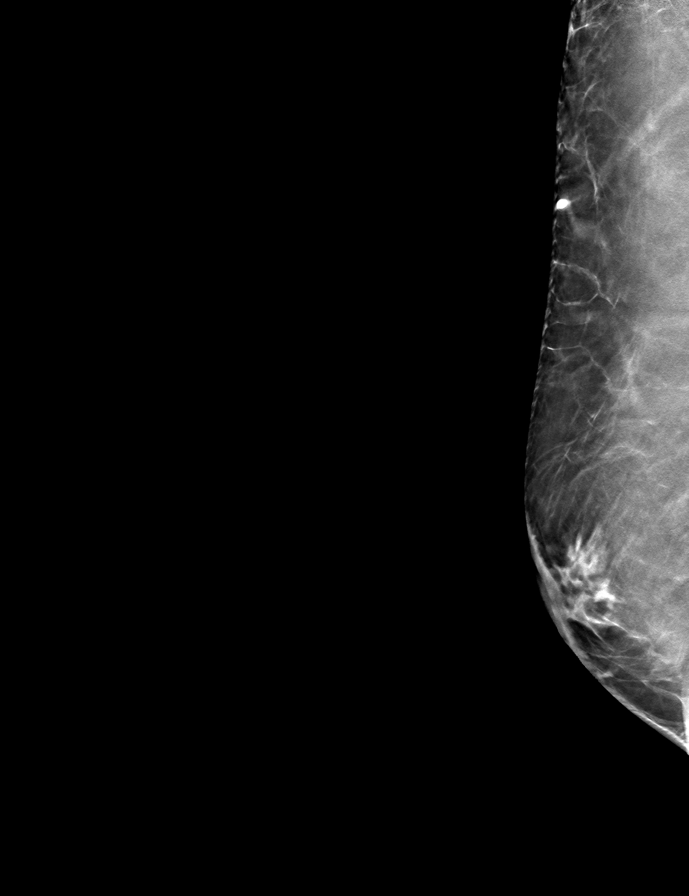

[L MLO tomo · tomo slice 33/64.0]
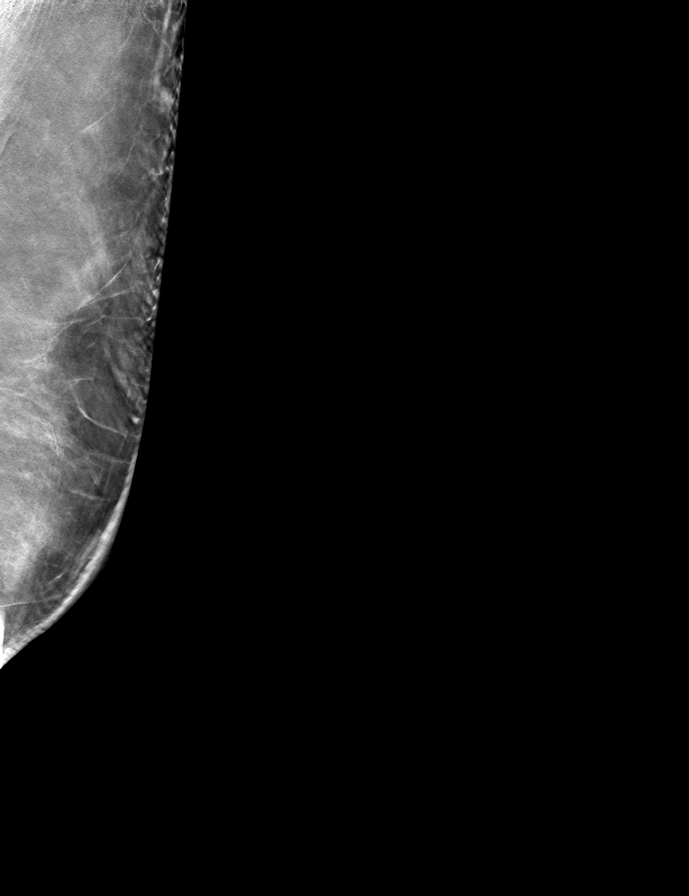

[R TAN tomo · tomo slice 31/62.0]
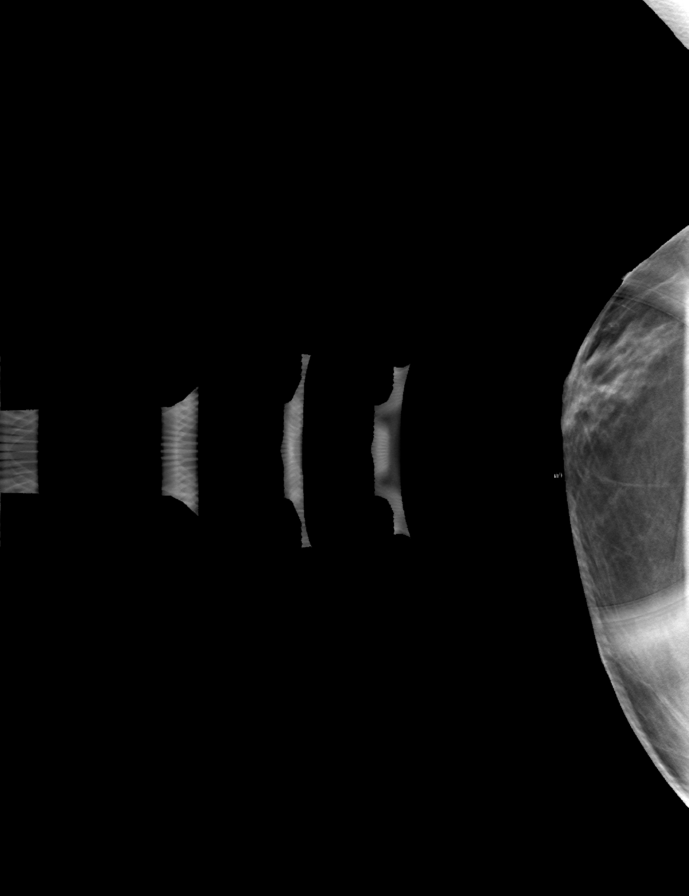

[9 of 24 positions shown; findings below may reference images not displayed]

ACR Breast Density Category b: There are scattered areas of
fibroglandular density.
FINDINGS: The patient is palpating gynecomastia on the right which is
asymmetric to the left. Minimal gynecomastia on the left. No
evidence of malignancy in either breast.

Mammographic images were processed with CAD.
IMPRESSION: Right greater than left gynecomastia, accounting for the patient's
symptoms.

RECOMMENDATION:
Treatment of the patient's gynecomastia should be based on clinical
and physical exam.

I have discussed the findings and recommendations with the patient.
Results were also provided in writing at the conclusion of the
visit. If applicable, a reminder letter will be sent to the patient
regarding the next appointment.

BI-RADS CATEGORY  2: Benign.

## 2021-04-03 DIAGNOSIS — N184 Chronic kidney disease, stage 4 (severe): Secondary | ICD-10-CM | POA: Diagnosis not present

## 2021-04-03 DIAGNOSIS — D631 Anemia in chronic kidney disease: Secondary | ICD-10-CM | POA: Diagnosis not present

## 2021-04-03 DIAGNOSIS — E872 Acidosis: Secondary | ICD-10-CM | POA: Diagnosis not present

## 2021-04-03 DIAGNOSIS — E875 Hyperkalemia: Secondary | ICD-10-CM | POA: Diagnosis not present

## 2021-04-03 DIAGNOSIS — E1122 Type 2 diabetes mellitus with diabetic chronic kidney disease: Secondary | ICD-10-CM | POA: Diagnosis not present

## 2021-04-03 DIAGNOSIS — N2581 Secondary hyperparathyroidism of renal origin: Secondary | ICD-10-CM | POA: Diagnosis not present

## 2021-04-03 DIAGNOSIS — I129 Hypertensive chronic kidney disease with stage 1 through stage 4 chronic kidney disease, or unspecified chronic kidney disease: Secondary | ICD-10-CM | POA: Diagnosis not present

## 2021-04-07 ENCOUNTER — Other Ambulatory Visit: Payer: Self-pay | Admitting: Adult Health

## 2021-04-10 DIAGNOSIS — N184 Chronic kidney disease, stage 4 (severe): Secondary | ICD-10-CM | POA: Diagnosis not present

## 2021-04-10 DIAGNOSIS — E871 Hypo-osmolality and hyponatremia: Secondary | ICD-10-CM | POA: Diagnosis not present

## 2021-04-10 DIAGNOSIS — D631 Anemia in chronic kidney disease: Secondary | ICD-10-CM | POA: Diagnosis not present

## 2021-04-10 DIAGNOSIS — N2581 Secondary hyperparathyroidism of renal origin: Secondary | ICD-10-CM | POA: Diagnosis not present

## 2021-04-10 DIAGNOSIS — E875 Hyperkalemia: Secondary | ICD-10-CM | POA: Diagnosis not present

## 2021-04-10 DIAGNOSIS — I129 Hypertensive chronic kidney disease with stage 1 through stage 4 chronic kidney disease, or unspecified chronic kidney disease: Secondary | ICD-10-CM | POA: Diagnosis not present

## 2021-04-10 DIAGNOSIS — E1122 Type 2 diabetes mellitus with diabetic chronic kidney disease: Secondary | ICD-10-CM | POA: Diagnosis not present

## 2021-04-10 DIAGNOSIS — E872 Acidosis: Secondary | ICD-10-CM | POA: Diagnosis not present

## 2021-04-12 ENCOUNTER — Other Ambulatory Visit: Payer: Self-pay | Admitting: Adult Health

## 2021-04-13 ENCOUNTER — Other Ambulatory Visit: Payer: Self-pay

## 2021-04-13 ENCOUNTER — Encounter: Payer: Self-pay | Admitting: Internal Medicine

## 2021-04-13 ENCOUNTER — Ambulatory Visit: Payer: Medicare Other | Admitting: Internal Medicine

## 2021-04-13 ENCOUNTER — Other Ambulatory Visit: Payer: Self-pay | Admitting: Adult Health

## 2021-04-13 VITALS — BP 146/60 | HR 67 | Temp 98.1°F | Resp 16 | Ht 66.0 in | Wt 204.4 lb

## 2021-04-13 DIAGNOSIS — J4520 Mild intermittent asthma, uncomplicated: Secondary | ICD-10-CM

## 2021-04-13 DIAGNOSIS — R0602 Shortness of breath: Secondary | ICD-10-CM

## 2021-04-13 DIAGNOSIS — I5022 Chronic systolic (congestive) heart failure: Secondary | ICD-10-CM

## 2021-04-13 DIAGNOSIS — Z9989 Dependence on other enabling machines and devices: Secondary | ICD-10-CM

## 2021-04-13 DIAGNOSIS — G4733 Obstructive sleep apnea (adult) (pediatric): Secondary | ICD-10-CM

## 2021-04-13 MED ORDER — PREDNISONE 5 MG PO TABS: 20.0000 mg | ORAL_TABLET | Freq: Every day | ORAL | 0 refills | Status: DC

## 2021-04-13 NOTE — Progress Notes (Unsigned)
[facility] [place], [place] [address]  Pulmonary Sleep Medicine   Office Visit Note  Patient Name: Mike Decker DOB: Nov 28, 1941 MRN [id number]  Date of Service: 04/13/2021  Complaints/HPI: Cough Asthma OSA  ROS  General: (-) fever, (-) chills, (-) night sweats, (-) weakness Skin: (-) rashes, (-) itching,. Eyes: (-) visual changes, (-) redness, (-) itching. Nose and Sinuses: (-) nasal stuffiness or itchiness, (-) postnasal drip, (-) nosebleeds, (-) sinus trouble. Mouth and Throat: (-) sore throat, (-) hoarseness. Neck: (-) swollen glands, (-) enlarged thyroid, (-) neck pain. Respiratory: + cough, (-) bloody sputum, + shortness of breath, + wheezing. Cardiovascular: + ankle swelling, (-) chest pain. Lymphatic: (-) lymph node enlargement. Neurologic: (-) numbness, (-) tingling. Psychiatric: (-) anxiety, (-) depression   Current Medication: Outpatient Encounter Medications as of 04/13/2021  Medication Sig Note  . acetaminophen (TYLENOL) 500 MG tablet Take 1,000 mg by mouth every 6 (six) hours as needed for mild pain, moderate pain or headache.   . allopurinol (ZYLOPRIM) 100 MG tablet Take 100 mg by mouth daily.    Marland Kitchen azelastine (ASTELIN) 0.1 % nasal spray USE 1 TO 2 SPRAYS IN EACH NOSTRIL TWICE A DAY   . cetirizine (ZYRTEC) 5 MG tablet Take 5 mg by mouth daily as needed for allergies or rhinitis.    . citalopram (CELEXA) 10 MG tablet TAKE 1 TABLET EVERY DAY   . clopidogrel (PLAVIX) 75 MG tablet Take 1 tablet by mouth daily.   . colchicine 0.6 MG tablet Take 3 tablets (1.8 mg total) by mouth daily as needed (gout flares).   Marland Kitchen dextromethorphan-guaiFENesin (MUCINEX DM) 30-600 MG 12hr tablet Take 1 tablet by mouth 2 (two) times daily as needed for cough.   . EPINEPHrine (EPI-PEN) 0.3 mg/0.3 mL SOAJ injection Inject into the muscle as needed (anaphylaxis).    . ferrous sulfate 325 (65 FE) MG EC tablet TAKE 1 TABLET BY MOUTH EVERY DAY   . fluticasone  (FLONASE) 50 MCG/ACT nasal spray USE 1 SPRAY IN EACH NOSTRIL EVERY DAY   . Fluticasone-Umeclidin-Vilant (TRELEGY ELLIPTA) 100-62.5-25 MCG/INH AEPB Inhale 1 puff into the lungs daily.   . folic acid (FOLVITE) 127 MCG tablet TAKE 1 TABLET (400 MCG TOTAL) BY MOUTH DAILY.   . furosemide (LASIX) 40 MG tablet Take by mouth. 40 mg in the morning and g in the evening if needed   . hydrALAZINE (APRESOLINE) 50 MG tablet TAKE ONE TABLET BY MOUTH TWICE A DAY HOLD DOSE FOR SYSTOLIC BLOOD PRESSURE  LESS THAN 100   . insulin aspart (NOVOLOG) 100 UNIT/ML FlexPen 14 UNITS IN AM, 18 UNITS AT LUNCH, 24 UNITS AT DINNER   . insulin glargine (LANTUS) 100 UNIT/ML injection Inject 0.35 mLs (35 Units total) into the skin at bedtime. This is a decrease from your previous 45 units nightly.   Marland Kitchen ipratropium-albuterol (DUONEB) 0.5-2.5 (3) MG/3ML SOLN Take 3 mLs by nebulization every 4 (four) hours as needed.   . isosorbide mononitrate (IMDUR) 60 MG 24 hr tablet Take 120 mg by mouth daily.    . magnesium oxide (MAG-OX) 400 (240 Mg) MG tablet Take by mouth.   . methocarbamol (ROBAXIN) 500 MG tablet Take 500 mg by mouth 2 (two) times daily.    . metoprolol tartrate (LOPRESSOR) 50 MG tablet Take 50 mg by mouth 3 (three) times daily.    . metoprolol tartrate (LOPRESSOR) 50 MG tablet TAKE 1.5 TABLETS BY MOUTH TWICE A DAY   . montelukast (SINGULAIR) 10 MG tablet  TAKE 1 TABLET BY MOUTH EVERYDAY AT BEDTIME   . nitroGLYCERIN (NITROSTAT) 0.4 MG SL tablet Place 0.4 mg under the tongue every 5 (five) minutes x 3 doses as needed for chest pain.    Marland Kitchen nystatin (MYCOSTATIN) 100000 UNIT/ML suspension Take 5 mLs (500,000 Units total) by mouth 4 (four) times daily. Swish and swallow 3 times daily for 10 days   . nystatin (MYCOSTATIN) 100000 UNIT/ML suspension Take 5 mLs (500,000 Units total) by mouth 4 (four) times daily.   . Omega-3 Fatty Acids (FISH OIL) 1000 MG CAPS Take 1 capsule by mouth daily.    . pantoprazole (PROTONIX) 40 MG tablet Take 1  tablet (40 mg total) by mouth 2 (two) times daily before a meal.   . ranolazine (RANEXA) 500 MG 12 hr tablet Take 1 tablet by mouth 2 (two) times daily.   . rosuvastatin (CRESTOR) 40 MG tablet Take 40 mg by mouth every evening.    . sacubitril-valsartan (ENTRESTO) 24-26 MG Take 1 tablet by mouth 2 (two) times daily.   . sodium bicarbonate 650 MG tablet Take 650 mg by mouth 2 (two) times daily.   . vitamin B-12 (CYANOCOBALAMIN) 1000 MCG tablet Take 1 tablet (1,000 mcg total) by mouth daily.   . vitamin C (ASCORBIC ACID) 250 MG tablet Take 250 mg by mouth daily.   . Vitamin D, Ergocalciferol, (DRISDOL) 50000 units CAPS capsule Take 50,000 Units by mouth every 30 (thirty) days.  12/14/2019: 23rd   . zinc gluconate 50 MG tablet Take 50 mg by mouth daily.   . isosorbide mononitrate (IMDUR) 120 MG 24 hr tablet    . [DISCONTINUED] aspirin EC 81 MG EC tablet Take 1 tablet (81 mg total) by mouth daily. (Patient not taking: Reported on 04/13/2021)   . [DISCONTINUED] azithromycin (ZITHROMAX) 250 MG tablet Take 1 tablet daily for 14 days (Patient not taking: Reported on 04/13/2021)   . [DISCONTINUED] budesonide-formoterol (SYMBICORT) 160-4.5 MCG/ACT inhaler Inhale 2 puffs into the lungs 2 (two) times daily as needed.  (Patient not taking: Reported on 04/13/2021)   . [DISCONTINUED] chlorpheniramine-HYDROcodone (TUSSIONEX PENNKINETIC ER) 10-8 MG/5ML SUER Take 5 mLs by mouth every 12 (twelve) hours as needed for cough. (Patient not taking: Reported on 04/13/2021)   . [DISCONTINUED] doxycycline (VIBRA-TABS) 100 MG tablet Take 1 tablet (100 mg total) by mouth 2 (two) times daily. (Patient not taking: Reported on 04/13/2021)   . [DISCONTINUED] furosemide (LASIX) 20 MG tablet Take 2 tablets (40 mg total) by mouth daily. (Patient not taking: Reported on 04/13/2021)   . [DISCONTINUED] predniSONE (DELTASONE) 10 MG tablet Take 2 tablets daily for 1 week followed by 1 tablet daily for 1 week. (Patient not taking: Reported on 04/13/2021)     No facility-administered encounter medications on file as of 04/13/2021.    Surgical History: Past Surgical History:  Procedure Laterality Date  . CATARACT EXTRACTION  2011, 2012  . COLONOSCOPY N/A 12/14/2018   Procedure: COLONOSCOPY;  Surgeon: Virgel Manifold, MD;  Location: [facility] ENDOSCOPY;  Service: Endoscopy;  Laterality: N/A;  . CORONARY ANGIOPLASTY WITH STENT PLACEMENT  2012  . CORONARY STENT INTERVENTION N/A 12/29/2019   Procedure: CORONARY STENT INTERVENTION;  Surgeon: Nelva Bush, MD;  Location: [place] [facility];  Service: Cardiovascular;  Laterality: N/A;  . ESOPHAGOGASTRODUODENOSCOPY N/A 12/14/2018   Procedure: ESOPHAGOGASTRODUODENOSCOPY (EGD);  Surgeon: Virgel Manifold, MD;  Location: [facility] ENDOSCOPY;  Service: Endoscopy;  Laterality: N/A;  . ESOPHAGOGASTRODUODENOSCOPY (EGD) WITH PROPOFOL N/A 06/01/2016   Procedure: ESOPHAGOGASTRODUODENOSCOPY (EGD) WITH  PROPOFOL;  Surgeon: Manya Silvas, MD;  Location: [facility] ENDOSCOPY;  Service: Endoscopy;  Laterality: N/A;  . HERNIA REPAIR     x2  . INTRAOCULAR LENS INSERTION    . LEFT HEART CATH AND CORONARY ANGIOGRAPHY N/A 05/09/2017   Procedure: Left Heart Cath and Coronary Angiography;  Surgeon: Yolonda Kida, MD;  Location: [place] [facility];  Service: Cardiovascular;  Laterality: N/A;  . LEFT HEART CATH AND CORONARY ANGIOGRAPHY N/A 12/08/2018   Procedure: LEFT HEART CATH AND CORONARY ANGIOGRAPHY and possible PCI and stent;  Surgeon: Yolonda Kida, MD;  Location: [place] [facility] LAB;  Service: Cardiovascular;  Laterality: N/A;  . LEFT HEART CATH AND CORONARY ANGIOGRAPHY N/A 12/29/2019   Procedure: LEFT HEART CATH AND CORONARY ANGIOGRAPHY;  Surgeon: Nelva Bush, MD;  Location: [place] [facility];  Service: Cardiovascular;  Laterality: N/A;  . NOSE SURGERY     submucous resection  . PROSTATE SURGERY  2002  . ROTATOR CUFF REPAIR Right     Medical History: Past Medical History:  Diagnosis Date  .  Anemia   . Arthritis   . Atrioventricular canal (AVC)    irregular heart beats  . Barrett esophagus   . Bronchiolitis   . Cancer ([place]) 2002   prostate, esphageal  . Chronic diastolic CHF (congestive heart failure) ([place])   . Colon polyp   . Diabetes mellitus without complication ([place])   . Diverticulosis   . Gout   . Heart disease   . Hemangioma    liver  . Hyperlipidemia   . Hypertension   . Myocardial infarct ([place])   . Ocular hypertension   . Peripheral vascular disease ([place])   . Skin cancer   . 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[place])   . Sleep apnea   . Vitreoretinal degeneration     Family History: Family History  Problem Relation Age of Onset  . Arthritis Mother   . Stroke Maternal Grandfather   . Breast cancer Neg Hx     Social History: Social History   Socioeconomic History  . Marital status: Married    Spouse name: Not on file  . Number of children: Not on file  . Years of education: Not on file  . Highest education level: Not on file  Occupational History  . Not on file  Tobacco Use  . Smoking status: Former Smoker    Years: 20.00    Types: Cigarettes    Quit date: 12/10/1976    Years since quitting: 44.3  . Smokeless tobacco: Never Used  Substance and Sexual Activity  . Alcohol use: No  . Drug use: No  . Sexual activity: Not on file  Other Topics Concern  . Not on file  Social History Narrative  . Not on file   Social Determinants of Health   Financial Resource Strain: Not on file  Food Insecurity: Not on file  Transportation Needs: Not on file  Physical Activity: Not on file  Stress: Not on file  Social Connections: Not on file  Intimate Partner Violence: Not on file    Vital Signs: Blood pressure (!) 146/60, pulse 67, temperature 98.1 F (36.7 C), resp. rate 16, height 5\' 6"  (1.676 m), weight 204 lb 6.4 oz (92.7 kg), SpO2 93 %.  Examination: General Appearance: The patient is well-developed, well-nourished, and in no distress. Skin: Gross inspection  of skin unremarkable. Head: normocephalic, no gross deformities. Eyes: no gross deformities noted. ENT: ears appear grossly normal no exudates. Neck: Supple. No thyromegaly.  No LAD. Respiratory: few scattered rhonchi. Cardiovascular: Normal S1 and S2 without murmur or rub. Extremities: No cyanosis. pulses are equal. Neurologic: Alert and oriented. No involuntary movements.  LABS: No results found for this or any previous visit (from the past 2160 hour(s)).  Radiology: CT Chest High Resolution  Result Date: 10/21/2020 CLINICAL DATA:  Dyspnea and wheezing. EXAM: CT CHEST WITHOUT CONTRAST TECHNIQUE: Multidetector CT imaging of the chest was performed following the standard protocol without intravenous contrast. High resolution imaging of the lungs, as well as inspiratory and expiratory imaging, was performed. COMPARISON:  12/11/2018 high-resolution chest CT. FINDINGS: Cardiovascular: Normal heart size. No significant pericardial effusion/thickening. Three-vessel coronary atherosclerosis. Aortic valvular calcifications. Atherosclerotic nonaneurysmal thoracic aorta. Normal caliber pulmonary arteries. Mediastinum/Nodes: No discrete thyroid nodules. Unremarkable esophagus. No pathologically enlarged axillary, mediastinal or hilar lymph nodes, noting limited sensitivity for the detection of hilar adenopathy on this noncontrast study. Lungs/Pleura: No pneumothorax. No pleural effusion. No acute consolidative airspace disease, lung masses or significant pulmonary nodules. Several scattered tiny subcentimeter calcified granulomas in both lungs, unchanged. No significant lobular air trapping or evidence of tracheobronchomalacia on the expiration sequence. There is patchy subpleural ground-glass opacity and reticulation throughout both lungs with associated mild traction bronchiectasis and architectural distortion. No frank honeycombing. Slight basilar predominance to these findings. Compared to 12/11/2018 chest  CT, the ground-glass opacities have decreased and the reticulation and bronchiectasis are stable. No progressive findings. Upper abdomen: Small hiatal hernia. Hypodense 3.4 cm right liver dome mass (series 2/image 101), stable since 08/17/2011 CT abdomen study where it was seen to represent a hemangioma. Musculoskeletal: No aggressive appearing focal osseous lesions. Mild gynecomastia, asymmetric to the right, increased. Mild thoracic spondylosis. IMPRESSION: 1. 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 Ground-glass opacities have decreased since 12/11/2018 chest CT and bronchiectasis and reticulation are stable. No progressive findings in the interval. 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 [place], Iss 5, [phone], Aug 10 2017. 2. Three-vessel coronary atherosclerosis. 3. Small hiatal hernia. 4. Mild gynecomastia, asymmetric to the right, increased. 5. Aortic Atherosclerosis (ICD10-I70.0). Electronically Signed   By: Ilona Sorrel M.D.   On: 10/21/2020 10:32    No results found.  No results found.    Assessment and Plan: Patient Active Problem List   Diagnosis Date Noted  . Urinary tract infection without hematuria 11/01/2020  . Dysuria 11/01/2020  . Hyposmolality and/or hyponatremia 09/14/2020  . Cough 09/13/2020  . Bronchitis 07/15/2020  . Palpitations 07/15/2020  . Aortic atherosclerosis ([place]) 06/21/2020  . Heart failure ([place]) 06/21/2020  . Otalgia of both ears 05/27/2020  . Lobar pneumonia ([place])   . Type 2 diabetes mellitus with stage 4 chronic kidney disease, without long-term current use of insulin ([place]) 12/14/2019  . Elevated troponin   . Acidosis 10/30/2019  . Benign hypertensive kidney disease  with chronic kidney disease 10/30/2019  . Hyperkalemia 10/30/2019  . Secondary hyperparathyroidism of renal origin ([place]) 10/30/2019  . 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 Encounter for fitting and adjustment of hearing aid 06/16/2019  . Health maintenance alteration 06/16/2019  . Prostate cancer ([place]) 06/16/2019  . DM type 2 with diabetic peripheral neuropathy ([place]) 01/25/2019  . CKD (chronic kidney disease) stage 4, GFR 15-29 ml/min (HCC) 01/21/2019  . Gout 01/21/2019  . MR (mitral regurgitation)  01/21/2019  . Chronic obstructive pulmonary disease ([place]) 01/08/2019  . OSA (obstructive sleep apnea) 01/08/2019  . Chronic diastolic heart failure ([place]) 12/30/2018  . Polyp of sigmoid colon   . Benign neoplasm of ascending colon   . Hemorrhoids without complication   . Diverticulosis of large intestine without diverticulitis   . Reflux esophagitis   . Lymphangiectasia   . Arteriovenous malformation of duodenum   . Symptomatic anemia   . SOB (shortness of breath)   . Iron deficiency anemia due to chronic blood loss   . Unstable angina ([place])   . Anemia of chronic renal failure, stage 4 (severe) ([place])   . Chest pain 11/14/2018  . Sciatica 11/12/2018  . CAD (coronary artery disease) 02/27/2018  . Sacroiliac joint pain 10/08/2017  . Pain in limb 09/24/2017  . Neuropathy 07/24/2017  . NSTEMI (non-ST elevated myocardial infarction) ([place]) 05/06/2017  . PAD (peripheral artery disease) ([place]) 05/01/2017  . History of stroke 01/02/2017  . Barrett's esophagus 10/02/2016  . Carotid stenosis 10/02/2016  . Essential hypertension 10/02/2016  . Hyperlipidemia 10/02/2016  . Diabetes ([place]) 10/02/2016  . 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 Uncontrolled type 2 diabetes mellitus with hyperglycemia, with long-term current use of insulin ([place]) 04/10/2016  . TIA (transient ischemic attack) 10/08/2015    1. OSA on CPAP ***  2. Shortness of breath *** -  Spirometry with Graph  3. Chronic systolic congestive heart failure (HCC) ***  4. Chronic asthma, mild intermittent, uncomplicated *** - predniSONE (DELTASONE) 5 MG tablet; Take 4 tablets (20 mg total) by mouth daily with breakfast. Take 20mg  daily for 4 days then taper by 5mg  daily for 3 days  Dispense: 100 tablet; Refill: 0  General Counseling: I have discussed the findings of the evaluation and examination with Marland Kitchen.  I have also discussed any further diagnostic evaluation thatmay be needed or ordered today. Green verbalizes understanding of the findings of todays visit. We also reviewed his medications today and discussed drug interactions and side effects including but not limited excessive drowsiness and altered mental states. We also discussed that there is always a risk not just to him but also people around him. he has been encouraged to call the office with any questions or concerns that should arise related to todays visit.  Orders Placed This Encounter  Procedures  . Spirometry with Graph    Order Specific Question:   Where should this test be performed?    Answer:   [facility]    Order Specific Question:   Basic spirometry    Answer:   Yes    Order Specific Question:   Spirometry pre & post bronchodilator    Answer:   No     Time spent: 41  I have personally obtained a history, examined the patient, evaluated laboratory and imaging results, formulated the assessment and plan and placed orders.    Allyne Gee, MD [facility] Pulmonary and Critical Care Sleep medicine

## 2021-04-14 ENCOUNTER — Other Ambulatory Visit: Payer: Self-pay

## 2021-04-14 MED ORDER — MONTELUKAST SODIUM 10 MG PO TABS: ORAL_TABLET | ORAL | 1 refills | Status: DC

## 2021-04-24 DIAGNOSIS — E669 Obesity, unspecified: Secondary | ICD-10-CM | POA: Diagnosis not present

## 2021-04-24 DIAGNOSIS — I25118 Atherosclerotic heart disease of native coronary artery with other forms of angina pectoris: Secondary | ICD-10-CM | POA: Diagnosis not present

## 2021-04-24 DIAGNOSIS — G4733 Obstructive sleep apnea (adult) (pediatric): Secondary | ICD-10-CM | POA: Diagnosis not present

## 2021-04-24 DIAGNOSIS — N184 Chronic kidney disease, stage 4 (severe): Secondary | ICD-10-CM | POA: Diagnosis not present

## 2021-04-24 DIAGNOSIS — Z8673 Personal history of transient ischemic attack (TIA), and cerebral infarction without residual deficits: Secondary | ICD-10-CM | POA: Diagnosis not present

## 2021-04-24 DIAGNOSIS — Z955 Presence of coronary angioplasty implant and graft: Secondary | ICD-10-CM | POA: Diagnosis not present

## 2021-04-24 DIAGNOSIS — R002 Palpitations: Secondary | ICD-10-CM | POA: Diagnosis not present

## 2021-04-24 DIAGNOSIS — I5033 Acute on chronic diastolic (congestive) heart failure: Secondary | ICD-10-CM | POA: Diagnosis not present

## 2021-04-24 DIAGNOSIS — E782 Mixed hyperlipidemia: Secondary | ICD-10-CM | POA: Diagnosis not present

## 2021-04-24 DIAGNOSIS — Z794 Long term (current) use of insulin: Secondary | ICD-10-CM | POA: Diagnosis not present

## 2021-04-24 DIAGNOSIS — E119 Type 2 diabetes mellitus without complications: Secondary | ICD-10-CM | POA: Diagnosis not present

## 2021-05-09 DIAGNOSIS — R1319 Other dysphagia: Secondary | ICD-10-CM | POA: Diagnosis not present

## 2021-05-09 DIAGNOSIS — K22711 Barrett's esophagus with high grade dysplasia: Secondary | ICD-10-CM | POA: Diagnosis not present

## 2021-05-19 ENCOUNTER — Other Ambulatory Visit: Payer: Self-pay | Admitting: Oncology

## 2021-06-06 ENCOUNTER — Ambulatory Visit (INDEPENDENT_AMBULATORY_CARE_PROVIDER_SITE_OTHER): Payer: Medicare Other | Admitting: Vascular Surgery

## 2021-06-06 ENCOUNTER — Encounter (INDEPENDENT_AMBULATORY_CARE_PROVIDER_SITE_OTHER): Payer: Medicare Other

## 2021-06-18 DIAGNOSIS — Z20822 Contact with and (suspected) exposure to covid-19: Secondary | ICD-10-CM | POA: Diagnosis not present

## 2021-06-20 ENCOUNTER — Ambulatory Visit (INDEPENDENT_AMBULATORY_CARE_PROVIDER_SITE_OTHER): Payer: Medicare Other

## 2021-06-20 ENCOUNTER — Ambulatory Visit (INDEPENDENT_AMBULATORY_CARE_PROVIDER_SITE_OTHER): Payer: Medicare Other | Admitting: Vascular Surgery

## 2021-06-20 ENCOUNTER — Other Ambulatory Visit: Payer: Self-pay

## 2021-06-20 VITALS — BP 122/64 | HR 56 | Ht 66.0 in | Wt 200.0 lb

## 2021-06-20 DIAGNOSIS — I6523 Occlusion and stenosis of bilateral carotid arteries: Secondary | ICD-10-CM

## 2021-06-20 DIAGNOSIS — N184 Chronic kidney disease, stage 4 (severe): Secondary | ICD-10-CM | POA: Diagnosis not present

## 2021-06-20 DIAGNOSIS — I739 Peripheral vascular disease, unspecified: Secondary | ICD-10-CM

## 2021-06-20 DIAGNOSIS — I10 Essential (primary) hypertension: Secondary | ICD-10-CM

## 2021-06-20 DIAGNOSIS — E785 Hyperlipidemia, unspecified: Secondary | ICD-10-CM | POA: Diagnosis not present

## 2021-06-20 DIAGNOSIS — E1122 Type 2 diabetes mellitus with diabetic chronic kidney disease: Secondary | ICD-10-CM | POA: Diagnosis not present

## 2021-06-20 NOTE — Progress Notes (Signed)
MRN : [id number]  Mike Decker is a 80 y.o. (01-29-1941) male who presents with chief complaint of  Chief Complaint  Patient presents with   Follow-up    89yr [place]  .  History of Present Illness: Patient returns today in follow up of multiple vascular issues.  He is doing well.  His renal function has been stable as much is he knows.  His creatinine clearance has been around 30.  He has not had any focal neurologic symptoms. Specifically, the patient denies amaurosis fugax, speech or swallowing difficulties, or arm or leg weakness or numbness. Carotid duplex today revealed 1 to 39% stenosis bilaterally by velocity criteria. He is also followed for PAD.  No limb threatening symptoms of rest pain, ulceration, or infection.  Has some discomfort with activity although it is fairly mild.  Does have some neuropathic type symptoms.  ABIs today may be slightly falsely elevated from medial calcification, but they fall in the normal range at 1.3 on the right and 1.1 on the left.  Digit pressure is 107 on the right and 84 on the left.  Waveforms are biphasic.  Current Outpatient Medications  Medication Sig Dispense Refill   allopurinol (ZYLOPRIM) 100 MG tablet Take 100 mg by mouth daily.      azelastine (ASTELIN) 0.1 % nasal spray USE 1 TO 2 SPRAYS IN EACH NOSTRIL TWICE A DAY     cetirizine (ZYRTEC) 5 MG tablet Take 5 mg by mouth daily as needed for allergies or rhinitis.      citalopram (CELEXA) 10 MG tablet TAKE 1 TABLET EVERY DAY 90 tablet 3   clopidogrel (PLAVIX) 75 MG tablet Take 1 tablet by mouth daily.     colchicine 0.6 MG tablet Take 3 tablets (1.8 mg total) by mouth daily as needed (gout flares).     [employer] VITAMIN B12 1000 MCG tablet TAKE 1 TABLET BY MOUTH EVERY DAY 90 tablet 1   dextromethorphan-guaiFENesin (MUCINEX DM) 30-600 MG 12hr tablet Take 1 tablet by mouth 2 (two) times daily as needed for cough.     EPINEPHrine (EPI-PEN) 0.3 mg/0.3 mL SOAJ injection Inject into the muscle as  needed (anaphylaxis).      ferrous sulfate 325 (65 FE) MG EC tablet TAKE 1 TABLET BY MOUTH EVERY DAY 90 tablet 3   fluticasone (FLONASE) 50 MCG/ACT nasal spray USE 1 SPRAY IN EACH NOSTRIL EVERY DAY 32 g 6   Fluticasone-Umeclidin-Vilant (TRELEGY ELLIPTA) 100-62.5-25 MCG/INH AEPB Inhale 1 puff into the lungs daily. 3 each 4   folic acid (FOLVITE) 891 MCG tablet TAKE 1 TABLET (400 MCG TOTAL) BY MOUTH DAILY. 100 tablet 3   furosemide (LASIX) 40 MG tablet Take by mouth. 40 mg in the morning and g in the evening if needed     hydrALAZINE (APRESOLINE) 50 MG tablet TAKE ONE TABLET BY MOUTH TWICE A DAY HOLD DOSE FOR SYSTOLIC BLOOD PRESSURE  LESS THAN 100     insulin aspart (NOVOLOG) 100 UNIT/ML FlexPen 14 UNITS IN AM, 18 UNITS AT LUNCH, 24 UNITS AT DINNER     insulin glargine (LANTUS) 100 UNIT/ML injection Inject 0.35 mLs (35 Units total) into the skin at bedtime. This is a decrease from your previous 45 units nightly. 10 mL 11   ipratropium-albuterol (DUONEB) 0.5-2.5 (3) MG/3ML SOLN Take 3 mLs by nebulization every 4 (four) hours as needed. 360 mL 0   isosorbide mononitrate (IMDUR) 120 MG 24 hr tablet      isosorbide mononitrate (IMDUR) 60  MG 24 hr tablet Take 120 mg by mouth daily.      magnesium oxide (MAG-OX) 400 (240 Mg) MG tablet Take by mouth.     methocarbamol (ROBAXIN) 500 MG tablet Take 500 mg by mouth 2 (two) times daily.   0   metoprolol tartrate (LOPRESSOR) 50 MG tablet Take 50 mg by mouth 3 (three) times daily.      metoprolol tartrate (LOPRESSOR) 50 MG tablet TAKE 1.5 TABLETS BY MOUTH TWICE A DAY     montelukast (SINGULAIR) 10 MG tablet TAKE 1 TABLET BY MOUTH EVERYDAY AT BEDTIME 90 tablet 1   nitroGLYCERIN (NITROSTAT) 0.4 MG SL tablet Place 0.4 mg under the tongue every 5 (five) minutes x 3 doses as needed for chest pain.      nystatin (MYCOSTATIN) 100000 UNIT/ML suspension Take 5 mLs (500,000 Units total) by mouth 4 (four) times daily. Swish and swallow 3 times daily for 10 days 60 mL 0    nystatin (MYCOSTATIN) 100000 UNIT/ML suspension Take 5 mLs (500,000 Units total) by mouth 4 (four) times daily. 60 mL 0   Omega-3 Fatty Acids (FISH OIL) 1000 MG CAPS Take 1 capsule by mouth daily.      pantoprazole (PROTONIX) 40 MG tablet Take 1 tablet (40 mg total) by mouth 2 (two) times daily before a meal.     predniSONE (DELTASONE) 5 MG tablet Take 4 tablets (20 mg total) by mouth daily with breakfast. Take 20mg  daily for 4 days then taper by 5mg  daily for 3 days 100 tablet 0   ranolazine (RANEXA) 500 MG 12 hr tablet Take 1 tablet by mouth 2 (two) times daily.     rosuvastatin (CRESTOR) 40 MG tablet Take 40 mg by mouth every evening.      sacubitril-valsartan (ENTRESTO) 24-26 MG Take 1 tablet by mouth 2 (two) times daily. 180 tablet 3   sodium bicarbonate 650 MG tablet Take 650 mg by mouth 2 (two) times daily.     vitamin C (ASCORBIC ACID) 250 MG tablet Take 250 mg by mouth daily.     Vitamin D, Ergocalciferol, (DRISDOL) 50000 units CAPS capsule Take 50,000 Units by mouth every 30 (thirty) days.   1   zinc gluconate 50 MG tablet Take 50 mg by mouth daily.     acetaminophen (TYLENOL) 500 MG tablet Take 1,000 mg by mouth every 6 (six) hours as needed for mild pain, moderate pain or headache. (Patient not taking: Reported on 06/20/2021)     No current facility-administered medications for this visit.    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[place])    Sleep apnea    Vitreoretinal  degeneration     Past Surgical History:  Procedure Laterality Date   CATARACT EXTRACTION  2011, 2012   COLONOSCOPY N/A 12/14/2018   Procedure: COLONOSCOPY;  Surgeon: Vonda Antigua  B, MD;  Location: [facility] ENDOSCOPY;  Service: Endoscopy;  Laterality: N/A;   CORONARY ANGIOPLASTY WITH STENT PLACEMENT  2012   CORONARY STENT INTERVENTION N/A 12/29/2019   Procedure: CORONARY STENT INTERVENTION;  Surgeon: Nelva Bush, MD;  Location: [place] CV LAB;  Service: Cardiovascular;  Laterality: N/A;   ESOPHAGOGASTRODUODENOSCOPY N/A 12/14/2018   Procedure: ESOPHAGOGASTRODUODENOSCOPY (EGD);  Surgeon: Virgel Manifold, MD;  Location: [facility] ENDOSCOPY;  Service: Endoscopy;  Laterality: N/A;   ESOPHAGOGASTRODUODENOSCOPY (EGD) WITH PROPOFOL N/A 06/01/2016   Procedure: ESOPHAGOGASTRODUODENOSCOPY (EGD) WITH PROPOFOL;  Surgeon: Manya Silvas, MD;  Location: [facility] ENDOSCOPY;  Service: Endoscopy;  Laterality: N/A;   HERNIA REPAIR     x2   INTRAOCULAR LENS INSERTION     LEFT HEART CATH AND CORONARY ANGIOGRAPHY N/A 05/09/2017   Procedure: Left Heart Cath and Coronary Angiography;  Surgeon: Yolonda Kida, MD;  Location: [place] [facility] LAB;  Service: Cardiovascular;  Laterality: N/A;   LEFT HEART CATH AND CORONARY ANGIOGRAPHY N/A 12/08/2018   Procedure: LEFT HEART CATH AND CORONARY ANGIOGRAPHY and possible PCI and stent;  Surgeon: Yolonda Kida, MD;  Location: [place] CV LAB;  Service: Cardiovascular;  Laterality: N/A;   LEFT HEART CATH AND CORONARY ANGIOGRAPHY N/A 12/29/2019   Procedure: LEFT HEART CATH AND CORONARY ANGIOGRAPHY;  Surgeon: Nelva Bush, MD;  Location: [place] CV LAB;  Service: Cardiovascular;  Laterality: N/A;   NOSE SURGERY     submucous resection   PROSTATE SURGERY  2002   ROTATOR CUFF REPAIR Right      Social History   Tobacco Use   Smoking status: Former    Years: 20.00    Pack years: 0.00    Types: Cigarettes    Quit date: 12/10/1976    Years since  quitting: 44.5   Smokeless tobacco: Never  Substance Use Topics   Alcohol use: No   Drug use: No      Family History  Problem Relation Age of Onset   Arthritis Mother    Stroke Maternal Grandfather    Breast cancer Neg Hx      Allergies  Allergen Reactions   Gabapentin     Other reaction(s): Other (See Comments) Tremors   Peanut-Containing Drug Products Anaphylaxis   Penicillins Hives and Rash    Has patient had a PCN reaction causing immediate rash, facial/tongue/throat swelling, SOB or lightheadedness with hypotension: Yes Has patient had a PCN reaction causing severe rash involving mucus membranes or skin necrosis: No Has patient had a PCN reaction that required hospitalization No Has patient had a PCN reaction occurring within the last 10 years: No If all of the above answers are "NO", then may proceed with Cephalosporin use.   Bee Venom Swelling   Influenza Vaccines Hives   Inh [Isoniazid] Hives   Kenalog [Triamcinolone Acetonide] Hives   Levaquin [Levofloxacin] Other (See Comments)    Tendon, ligament pain.    Lisinopril     Other reaction(s): Hyperkalemia   Nalfon [Fenoprofen Calcium] Hives   Naproxen    Peanut Oil    Nsaids Rash    Nalfon 600 Nalfon 600    REVIEW OF SYSTEMS (Negative unless checked)   Constitutional: [] Weight loss  [] Fever  [] Chills Cardiac: [] Chest pain   [] Chest pressure   [] Palpitations   [] Shortness of breath when laying flat   [x] Shortness of breath at rest   [x] Shortness of breath with exertion. Vascular:  [x] Pain in legs with walking   [x] Pain in legs at rest   []   Pain in legs when laying flat   [x] Claudication   [] Pain in feet when walking  [] Pain in feet at rest  [] Pain in feet when laying flat   [] History of DVT   [] Phlebitis   [] Swelling in legs   [] Varicose veins   [] Non-healing ulcers Pulmonary:   [] Uses home oxygen   [] Productive cough   [] Hemoptysis   [] Wheeze  [] COPD   [] Asthma Neurologic:  [] Dizziness  [] Blackouts    [] Seizures   [] History of stroke   [x] History of TIA  [] Aphasia   [] Temporary blindness   [] Dysphagia   [] Weakness or numbness in arms   [x] Weakness or numbness in legs Musculoskeletal:  [x] Arthritis   [] Joint swelling   [] Joint pain   [] Low back pain Hematologic:  [] Easy bruising  [] Easy bleeding   [] Hypercoagulable state   [] Anemic  [] Hepatitis Gastrointestinal:  [] Blood in stool   [] Vomiting blood  [x] Gastroesophageal reflux/heartburn   [x] Difficulty swallowing. Genitourinary:  [] Chronic kidney disease   [] Difficult urination  [] Frequent urination  [] Burning with urination   [] Blood in urine Skin:  [] Rashes   [] Ulcers   [] Wounds Psychological:  [] History of anxiety   []  History of major depression.  Physical Examination  BP 122/64   Pulse (!) 56   Ht 5\' 6"  (1.676 m)   Wt 200 lb (90.7 kg)   BMI 32.28 kg/m  Gen:  WD/WN, NAD Head: [place]/AT, No temporalis wasting. Ear/Nose/Throat: Hearing grossly intact, nares w/o erythema or drainage Eyes: Conjunctiva clear. Sclera non-icteric Neck: Supple.  Trachea midline.  No bruit Pulmonary:  Good air movement, no use of accessory muscles.  Cardiac: RRR, no JVD Vascular:  Vessel Right Left  Radial Palpable Palpable                          PT Palpable Palpable  DP Palpable Palpable    Musculoskeletal: M/S 5/5 throughout.  No deformity or atrophy. No edema. Neurologic: Sensation grossly intact in extremities.  Symmetrical.  Speech is fluent.  Psychiatric: Judgment intact, Mood & affect appropriate for pt's clinical situation. Dermatologic: No rashes or ulcers noted.  No cellulitis or open wounds.      Labs No results found for this or any previous visit (from the past 2160 hour(s)).  Radiology No results found.  Assessment/Plan Diabetes (HCC) blood glucose control important in reducing the progression of atherosclerotic disease. Also, involved in wound healing. On appropriate medications.     Essential hypertension blood  pressure control important in reducing the progression of atherosclerotic disease. On appropriate oral medications.     Hyperlipidemia lipid control important in reducing the progression of atherosclerotic disease. Continue statin therapy   Symptomatic anemia Follows with hematology.  Recently restarted on B12.   CKD (chronic kidney disease) stage 4, GFR 15-29 ml/min (HCC) Avoid contrast unless absolutely necessary  Carotid stenosis Carotid duplex today revealed 1 to 39% stenosis bilaterally by velocity criteria.  No symptoms.  We will follow annually at this point.  PAD (peripheral artery disease) ([place]) ABIs today may be slightly falsely elevated from medial calcification, but they fall in the normal range at 1.3 on the right and 1.1 on the left.  Digit pressure is 107 on the right and 84 on the left.  Waveforms are biphasic. No role for intervention. Recheck in one year. Continue current medical regimen.     Leotis Pain, MD  06/20/2021 2:14 PM    This note was created with Dragon medical transcription system.  Any errors from dictation are purely unintentional

## 2021-06-20 NOTE — Assessment & Plan Note (Signed)
Carotid duplex today revealed 1 to 39% stenosis bilaterally by velocity criteria.  No symptoms.  We will follow annually at this point.

## 2021-06-20 NOTE — Assessment & Plan Note (Signed)
ABIs today may be slightly falsely elevated from medial calcification, but they fall in the normal range at 1.3 on the right and 1.1 on the left.  Digit pressure is 107 on the right and 84 on the left.  Waveforms are biphasic. No role for intervention. Recheck in one year. Continue current medical regimen.

## 2021-06-28 ENCOUNTER — Ambulatory Visit (INDEPENDENT_AMBULATORY_CARE_PROVIDER_SITE_OTHER): Payer: Medicare Other | Admitting: Internal Medicine

## 2021-06-28 ENCOUNTER — Encounter: Payer: Self-pay | Admitting: Internal Medicine

## 2021-06-28 ENCOUNTER — Other Ambulatory Visit: Payer: Self-pay

## 2021-06-28 VITALS — BP 142/80 | HR 66 | Ht 66.0 in | Wt 198.8 lb

## 2021-06-28 DIAGNOSIS — K2100 Gastro-esophageal reflux disease with esophagitis, without bleeding: Secondary | ICD-10-CM

## 2021-06-28 DIAGNOSIS — I10 Essential (primary) hypertension: Secondary | ICD-10-CM

## 2021-06-28 DIAGNOSIS — G4733 Obstructive sleep apnea (adult) (pediatric): Secondary | ICD-10-CM | POA: Diagnosis not present

## 2021-06-28 DIAGNOSIS — I5042 Chronic combined systolic (congestive) and diastolic (congestive) heart failure: Secondary | ICD-10-CM | POA: Diagnosis not present

## 2021-06-28 DIAGNOSIS — I6523 Occlusion and stenosis of bilateral carotid arteries: Secondary | ICD-10-CM | POA: Diagnosis not present

## 2021-06-28 DIAGNOSIS — I5082 Biventricular heart failure: Secondary | ICD-10-CM | POA: Insufficient documentation

## 2021-06-28 MED ORDER — TORSEMIDE 20 MG PO TABS
20.0000 mg | ORAL_TABLET | Freq: Every day | ORAL | 4 refills | Status: DC
Start: 2021-06-28 — End: 2021-08-17

## 2021-06-28 NOTE — Assessment & Plan Note (Signed)
We will stop Lasix and start on Demadex 20 mg p.o. twice a day

## 2021-06-28 NOTE — Assessment & Plan Note (Signed)
     ideally I want to keep systolic blood pressure below 130 mmHg, patient was asked to check blood pressure one times a week and give me a report on that.  Patient will be follow-up in 3 months  or earlier as needed, patient will call me back for any change in the cardiovascular symptoms ?Patient was advised to buy a book from local bookstore concerning blood pressure and read several chapters  every day.  This will be supplemented by some of the material we will give him from the office.  Patient should also utilize other resources like YouTube and Internet to learn more about the blood pressure and the diet. ?

## 2021-06-28 NOTE — Progress Notes (Signed)
Established Patient Office Visit  Subjective:  Patient ID: Mike Decker, male    DOB: [birth year]  Age: 80 y.o. 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, esphageal   Chronic diastolic CHF (congestive heart failure) (HCC)    Colon polyp    Diabetes mellitus without complication ([place])    Diverticulosis    Gout    Heart disease    Hemangioma    liver   Hyperlipidemia    Hypertension    Myocardial infarct [facility])    Ocular hypertension    Peripheral vascular disease ([place])    Skin cancer    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[place])    Sleep apnea    Vitreoretinal degeneration     Past Surgical History:  Procedure Laterality Date   CATARACT EXTRACTION  2011, 2012   COLONOSCOPY N/A 12/14/2018   Procedure: COLONOSCOPY;  Surgeon: Virgel Manifold, MD;  Location: [facility] ENDOSCOPY;  Service: Endoscopy;  Laterality: N/A;   CORONARY ANGIOPLASTY WITH STENT PLACEMENT  2012   CORONARY STENT INTERVENTION N/A 12/29/2019   Procedure: CORONARY STENT INTERVENTION;  Surgeon: Nelva Bush, MD;  Location: [place] [facility];  Service: Cardiovascular;  Laterality: N/A;   ESOPHAGOGASTRODUODENOSCOPY N/A 12/14/2018   Procedure: ESOPHAGOGASTRODUODENOSCOPY (EGD);  Surgeon: Virgel Manifold, MD;  Location: [facility] ENDOSCOPY;  Service: Endoscopy;  Laterality: N/A;   ESOPHAGOGASTRODUODENOSCOPY (EGD) WITH PROPOFOL N/A 06/01/2016   Procedure: ESOPHAGOGASTRODUODENOSCOPY (EGD) WITH PROPOFOL;  Surgeon: Manya Silvas, MD;  Location: [facility] ENDOSCOPY;  Service: Endoscopy;  Laterality: N/A;   HERNIA REPAIR     x2   INTRAOCULAR LENS INSERTION     LEFT HEART CATH AND CORONARY ANGIOGRAPHY N/A 05/09/2017    Procedure: Left Heart Cath and Coronary Angiography;  Surgeon: Yolonda Kida, MD;  Location: [place] [facility];  Service: Cardiovascular;  Laterality: N/A;   LEFT HEART CATH AND CORONARY ANGIOGRAPHY N/A 12/08/2018   Procedure: LEFT HEART CATH AND CORONARY ANGIOGRAPHY and possible PCI and stent;  Surgeon: Yolonda Kida, MD;  Location: [place] [facility];  Service: Cardiovascular;  Laterality: N/A;   LEFT HEART CATH AND CORONARY ANGIOGRAPHY N/A 12/29/2019   Procedure: LEFT HEART CATH AND CORONARY ANGIOGRAPHY;  Surgeon: Nelva Bush, MD;  Location: [place] [facility];  Service: Cardiovascular;  Laterality: N/A;   NOSE SURGERY     submucous resection   PROSTATE SURGERY  2002   ROTATOR CUFF REPAIR Right     Family History  Problem Relation Age of Onset   Arthritis Mother    Stroke Maternal Grandfather    Breast cancer Neg Hx     Social History   Socioeconomic History   Marital status: Married    Spouse name: Not on file   Number of children: Not on file   Years of education: Not on file   Highest education level: Not on file  Occupational History   Not on file  Tobacco Use   Smoking status: Former    Years: 20.00    Types: Cigarettes    Quit date: 12/10/1976    Years since quitting: 44.5   Smokeless  tobacco: Never  Substance and Sexual Activity   Alcohol use: No   Drug use: No   Sexual activity: Not on file  Other Topics Concern   Not on file  Social History Narrative   Not on file   Social Determinants of Health   Financial Resource Strain: Not on file  Food Insecurity: Not on file  Transportation Needs: Not on file  Physical Activity: Not on file  Stress: Not on file  Social Connections: Not on file  Intimate Partner Violence: Not on file     Current Outpatient Medications:    acetaminophen (TYLENOL) 500 MG tablet, Take 1,000 mg by mouth every 6 (six) hours as needed for mild pain, moderate pain or headache. (Patient not taking: Reported on  06/20/2021), Disp: , Rfl:    allopurinol (ZYLOPRIM) 100 MG tablet, Take 100 mg by mouth daily. , Disp: , Rfl:    azelastine (ASTELIN) 0.1 % nasal spray, USE 1 TO 2 SPRAYS IN EACH NOSTRIL TWICE A DAY, Disp: , Rfl:    cetirizine (ZYRTEC) 5 MG tablet, Take 5 mg by mouth daily as needed for allergies or rhinitis. , Disp: , Rfl:    citalopram (CELEXA) 10 MG tablet, TAKE 1 TABLET EVERY DAY, Disp: 90 tablet, Rfl: 3   clopidogrel (PLAVIX) 75 MG tablet, Take 1 tablet by mouth daily., Disp: , Rfl:    colchicine 0.6 MG tablet, Take 3 tablets (1.8 mg total) by mouth daily as needed (gout flares)., Disp: , Rfl:    [employer] VITAMIN B12 1000 MCG tablet, TAKE 1 TABLET BY MOUTH EVERY DAY, Disp: 90 tablet, Rfl: 1   dextromethorphan-guaiFENesin (MUCINEX DM) 30-600 MG 12hr tablet, Take 1 tablet by mouth 2 (two) times daily as needed for cough., Disp: , Rfl:    EPINEPHrine (EPI-PEN) 0.3 mg/0.3 mL SOAJ injection, Inject into the muscle as needed (anaphylaxis). , Disp: , Rfl:    ferrous sulfate 325 (65 FE) MG EC tablet, TAKE 1 TABLET BY MOUTH EVERY DAY, Disp: 90 tablet, Rfl: 3   fluticasone (FLONASE) 50 MCG/ACT nasal spray, USE 1 SPRAY IN EACH NOSTRIL EVERY DAY, Disp: 32 g, Rfl: 6   Fluticasone-Umeclidin-Vilant (TRELEGY ELLIPTA) 100-62.5-25 MCG/INH AEPB, Inhale 1 puff into the lungs daily., Disp: 3 each, Rfl: 4   folic acid (FOLVITE) 702 MCG tablet, TAKE 1 TABLET (400 MCG TOTAL) BY MOUTH DAILY., Disp: 100 tablet, Rfl: 3   furosemide (LASIX) 40 MG tablet, Take by mouth. 40 mg in the morning and g in the evening if needed, Disp: , Rfl:    hydrALAZINE (APRESOLINE) 50 MG tablet, TAKE ONE TABLET BY MOUTH TWICE A DAY HOLD DOSE FOR SYSTOLIC BLOOD PRESSURE  LESS THAN 100, Disp: , Rfl:    insulin aspart (NOVOLOG) 100 UNIT/ML FlexPen, 14 UNITS IN AM, 18 UNITS AT LUNCH, 24 UNITS AT DINNER, Disp: , Rfl:    insulin glargine (LANTUS) 100 UNIT/ML injection, Inject 0.35 mLs (35 Units total) into the skin at bedtime. This is a decrease from  your previous 45 units nightly., Disp: 10 mL, Rfl: 11   ipratropium-albuterol (DUONEB) 0.5-2.5 (3) MG/3ML SOLN, Take 3 mLs by nebulization every 4 (four) hours as needed., Disp: 360 mL, Rfl: 0   isosorbide mononitrate (IMDUR) 120 MG 24 hr tablet, , Disp: , Rfl:    isosorbide mononitrate (IMDUR) 60 MG 24 hr tablet, Take 120 mg by mouth daily. , Disp: , Rfl:    magnesium oxide (MAG-OX) 400 (240 Mg) MG tablet, Take by mouth., Disp: , Rfl:  methocarbamol (ROBAXIN) 500 MG tablet, Take 500 mg by mouth 2 (two) times daily. , Disp: , Rfl: 0   metoprolol tartrate (LOPRESSOR) 50 MG tablet, Take 50 mg by mouth 3 (three) times daily. , Disp: , Rfl:    metoprolol tartrate (LOPRESSOR) 50 MG tablet, TAKE 1.5 TABLETS BY MOUTH TWICE A DAY, Disp: , Rfl:    montelukast (SINGULAIR) 10 MG tablet, TAKE 1 TABLET BY MOUTH EVERYDAY AT BEDTIME, Disp: 90 tablet, Rfl: 1   nitroGLYCERIN (NITROSTAT) 0.4 MG SL tablet, Place 0.4 mg under the tongue every 5 (five) minutes x 3 doses as needed for chest pain. , Disp: , Rfl:    nystatin (MYCOSTATIN) 100000 UNIT/ML suspension, Take 5 mLs (500,000 Units total) by mouth 4 (four) times daily. Swish and swallow 3 times daily for 10 days, Disp: 60 mL, Rfl: 0   nystatin (MYCOSTATIN) 100000 UNIT/ML suspension, Take 5 mLs (500,000 Units total) by mouth 4 (four) times daily., Disp: 60 mL, Rfl: 0   Omega-3 Fatty Acids (FISH OIL) 1000 MG CAPS, Take 1 capsule by mouth daily. , Disp: , Rfl:    pantoprazole (PROTONIX) 40 MG tablet, Take 1 tablet (40 mg total) by mouth 2 (two) times daily before a meal., Disp: , Rfl:    predniSONE (DELTASONE) 5 MG tablet, Take 4 tablets (20 mg total) by mouth daily with breakfast. Take 20mg  daily for 4 days then taper by 5mg  daily for 3 days, Disp: 100 tablet, Rfl: 0   ranolazine (RANEXA) 500 MG 12 hr tablet, Take 1 tablet by mouth 2 (two) times daily., Disp: , Rfl:    rosuvastatin (CRESTOR) 40 MG tablet, Take 40 mg by mouth every evening. , Disp: , Rfl:     sacubitril-valsartan (ENTRESTO) 24-26 MG, Take 1 tablet by mouth 2 (two) times daily., Disp: 180 tablet, Rfl: 3   sodium bicarbonate 650 MG tablet, Take 650 mg by mouth 2 (two) times daily., Disp: , Rfl:    vitamin C (ASCORBIC ACID) 250 MG tablet, Take 250 mg by mouth daily., Disp: , Rfl:    Vitamin D, Ergocalciferol, (DRISDOL) 50000 units CAPS capsule, Take 50,000 Units by mouth every 30 (thirty) days. , Disp: , Rfl: 1   zinc gluconate 50 MG tablet, Take 50 mg by mouth daily., Disp: , Rfl:    Allergies  Allergen Reactions   Gabapentin     Other reaction(s): Other (See Comments) Tremors   Peanut-Containing Drug Products Anaphylaxis   Penicillins Hives and Rash    Has patient had a PCN reaction causing immediate rash, facial/tongue/throat swelling, SOB or lightheadedness with hypotension: Yes Has patient had a PCN reaction causing severe rash involving mucus membranes or skin necrosis: No Has patient had a PCN reaction that required hospitalization No Has patient had a PCN reaction occurring within the last 10 years: No If all of the above answers are "NO", then may proceed with Cephalosporin use.   Bee Venom Swelling   Influenza Vaccines Hives   Inh [Isoniazid] Hives   Kenalog [Triamcinolone Acetonide] Hives   Levaquin [Levofloxacin] Other (See Comments)    Tendon, ligament pain.    Lisinopril     Other reaction(s): Hyperkalemia   Nalfon [Fenoprofen Calcium] Hives   Naproxen    Peanut Oil    Nsaids Rash    Nalfon 600 Nalfon 600    ROS Review of Systems  Constitutional: Negative.   HENT: Negative.    Eyes: Negative.   Respiratory:  Positive for cough, shortness of breath and  wheezing.   Cardiovascular: Negative.   Gastrointestinal: Negative.   Endocrine: Negative.   Genitourinary: Negative.   Musculoskeletal: Negative.   Skin: Negative.   Allergic/Immunologic: Negative.   Neurological: Negative.   Hematological: Negative.   Psychiatric/Behavioral: Negative.    All  other systems reviewed and are negative.    Objective:    Physical Exam Vitals reviewed.  Constitutional:      Appearance: Normal appearance.  HENT:     Mouth/Throat:     Mouth: Mucous membranes are moist.  Eyes:     Pupils: Pupils are equal, round, and reactive to light.  Neck:     Vascular: No carotid bruit.  Cardiovascular:     Rate and Rhythm: Normal rate and regular rhythm.     Pulses: Normal pulses.     Heart sounds: Normal heart sounds.  Pulmonary:     Effort: Pulmonary effort is normal.     Breath sounds: Rales present.  Abdominal:     General: Bowel sounds are normal.     Palpations: Abdomen is soft. There is no hepatomegaly, splenomegaly or mass.     Tenderness: There is no abdominal tenderness.     Hernia: No hernia is present.  Musculoskeletal:     Cervical back: Neck supple.     Right lower leg: No edema.     Left lower leg: No edema.  Skin:    Findings: No rash.  Neurological:     Mental Status: He is alert and oriented to person, place, and time.     Motor: No weakness.  Psychiatric:        Mood and Affect: Mood normal.        Behavior: Behavior normal.    BP (!) 142/80   Pulse 66   Ht 5\' 6"  (1.676 m)   Wt 198 lb 12.8 oz (90.2 kg)   SpO2 96%   BMI 32.09 kg/m  Wt Readings from Last 3 Encounters:  06/28/21 198 lb 12.8 oz (90.2 kg)  06/20/21 200 lb (90.7 kg)  04/13/21 204 lb 6.4 oz (92.7 kg)     Health Maintenance Due  Topic Date Due   FOOT EXAM  Never done   Zoster Vaccines- Shingrix (1 of 2) Never done   COVID-19 Vaccine (3 - Pfizer risk series) 03/07/2020   HEMOGLOBIN A1C  06/13/2020    There are no preventive care reminders to display for this patient.  Lab Results  Component Value Date   TSH 1.948 12/06/2018   Lab Results  Component Value Date   WBC 9.3 11/22/2020   HGB 12.9 (L) 11/22/2020   HCT 38.5 (L) 11/22/2020   MCV 91.4 11/22/2020   PLT 226 11/22/2020   Lab Results  Component Value Date   NA 141 07/07/2020   K 4.4  07/07/2020   CO2 24 07/07/2020   GLUCOSE 194 (H) 04/18/2020   BUN 52 (H) 04/18/2020   CREATININE 1.91 (H) 04/18/2020   BILITOT 0.5 04/18/2020   ALKPHOS 65 04/18/2020   AST 19 04/18/2020   ALT 12 04/18/2020   PROT 7.4 04/18/2020   ALBUMIN 4.3 04/18/2020   CALCIUM 8.9 04/18/2020   ANIONGAP 11 07/07/2020   Lab Results  Component Value Date   CHOL 115 12/29/2019   Lab Results  Component Value Date   HDL 31 (L) 12/29/2019   Lab Results  Component Value Date   LDLCALC 51 12/29/2019   Lab Results  Component Value Date   TRIG 163 (H) 12/29/2019  Lab Results  Component Value Date   CHOLHDL 3.7 12/29/2019   Lab Results  Component Value Date   HGBA1C 8.7 (H) 12/15/2019      Assessment & Plan:   Problem List Items Addressed This Visit       Cardiovascular and Mediastinum   Essential hypertension - Primary        ideally I want to keep systolic blood pressure below 130 mmHg, patient was asked to check blood pressure one times a week and give me a report on that.  Patient will be follow-up in 3 months  or earlier as needed, patient will call me back for any change in the cardiovascular symptoms Patient was advised to buy a book from local bookstore concerning blood pressure and read several chapters  every day.  This will be supplemented by some of the material we will give him from the office.  Patient should also utilize other resources like YouTube and Internet to learn more about the blood pressure and the diet.       Heart failure (HCC)    We will stop Lasix and start on Demadex 20 mg p.o. twice a day         Respiratory   OSA (obstructive sleep apnea)    Continue to use CPAP machine         Digestive   Reflux esophagitis    - The patient's GERD is stable on medication.  - Instructed the patient to avoid eating spicy and acidic foods, as well as foods high in fat. - Instructed the patient to avoid eating large meals or meals 2-3 hours prior to  sleeping.        No orders of the defined types were placed in this encounter.   Follow-up: No follow-ups on file.    Cletis Athens, MD

## 2021-06-28 NOTE — Assessment & Plan Note (Signed)
-   The patient's GERD is stable on medication.  - Instructed the patient to avoid eating spicy and acidic foods, as well as foods high in fat. - Instructed the patient to avoid eating large meals or meals 2-3 hours prior to sleeping. 

## 2021-06-28 NOTE — Assessment & Plan Note (Signed)
Continue to use CPAP machine

## 2021-06-29 LAB — CBC WITH DIFFERENTIAL/PLATELET
Absolute Monocytes: 851 cells/uL (ref 200–950)
Basophils Absolute: 99 cells/uL (ref 0–200)
Basophils Relative: 1 %
Eosinophils Absolute: 970 cells/uL — ABNORMAL HIGH (ref 15–500)
Eosinophils Relative: 9.8 %
HCT: 44.1 % (ref 38.5–50.0)
Hemoglobin: 14.1 g/dL (ref 13.2–17.1)
Lymphs Abs: 1228 cells/uL (ref 850–3900)
MCH: 31.2 pg (ref 27.0–33.0)
MCHC: 32 g/dL (ref 32.0–36.0)
MCV: 97.6 fL (ref 80.0–100.0)
MPV: 10 fL (ref 7.5–12.5)
Monocytes Relative: 8.6 %
Neutro Abs: 6752 cells/uL (ref 1500–7800)
Neutrophils Relative %: 68.2 %
Platelets: 246 10*3/uL (ref 140–400)
RBC: 4.52 10*6/uL (ref 4.20–5.80)
RDW: 13.5 % (ref 11.0–15.0)
Total Lymphocyte: 12.4 %
WBC: 9.9 10*3/uL (ref 3.8–10.8)

## 2021-06-29 LAB — COMPLETE METABOLIC PANEL WITH GFR
AG Ratio: 2.1 (calc) (ref 1.0–2.5)
ALT: 9 U/L (ref 9–46)
AST: 11 U/L (ref 10–35)
Albumin: 4.8 g/dL (ref 3.6–5.1)
Alkaline phosphatase (APISO): 69 U/L (ref 35–144)
BUN/Creatinine Ratio: 22 (calc) (ref 6–22)
BUN: 52 mg/dL — ABNORMAL HIGH (ref 7–25)
CO2: 27 mmol/L (ref 20–32)
Calcium: 9.4 mg/dL (ref 8.6–10.3)
Chloride: 97 mmol/L — ABNORMAL LOW (ref 98–110)
Creat: 2.35 mg/dL — ABNORMAL HIGH (ref 0.70–1.22)
Globulin: 2.3 g/dL (calc) (ref 1.9–3.7)
Glucose, Bld: 233 mg/dL — ABNORMAL HIGH (ref 65–99)
Potassium: 4.4 mmol/L (ref 3.5–5.3)
Sodium: 135 mmol/L (ref 135–146)
Total Bilirubin: 0.4 mg/dL (ref 0.2–1.2)
Total Protein: 7.1 g/dL (ref 6.1–8.1)
eGFR: 27 mL/min/{1.73_m2} — ABNORMAL LOW (ref >=60)

## 2021-07-05 ENCOUNTER — Other Ambulatory Visit: Payer: Self-pay

## 2021-07-05 ENCOUNTER — Encounter: Payer: Self-pay | Admitting: Internal Medicine

## 2021-07-05 ENCOUNTER — Ambulatory Visit (INDEPENDENT_AMBULATORY_CARE_PROVIDER_SITE_OTHER): Payer: Medicare Other | Admitting: Internal Medicine

## 2021-07-05 VITALS — BP 145/65 | HR 57 | Ht 66.0 in | Wt 200.2 lb

## 2021-07-05 DIAGNOSIS — I700 Atherosclerosis of aorta: Secondary | ICD-10-CM

## 2021-07-05 DIAGNOSIS — I5082 Biventricular heart failure: Secondary | ICD-10-CM

## 2021-07-05 DIAGNOSIS — J449 Chronic obstructive pulmonary disease, unspecified: Secondary | ICD-10-CM | POA: Diagnosis not present

## 2021-07-05 DIAGNOSIS — N184 Chronic kidney disease, stage 4 (severe): Secondary | ICD-10-CM | POA: Diagnosis not present

## 2021-07-05 DIAGNOSIS — J40 Bronchitis, not specified as acute or chronic: Secondary | ICD-10-CM

## 2021-07-05 DIAGNOSIS — I10 Essential (primary) hypertension: Secondary | ICD-10-CM

## 2021-07-05 DIAGNOSIS — I2510 Atherosclerotic heart disease of native coronary artery without angina pectoris: Secondary | ICD-10-CM

## 2021-07-05 DIAGNOSIS — G4733 Obstructive sleep apnea (adult) (pediatric): Secondary | ICD-10-CM | POA: Diagnosis not present

## 2021-07-05 DIAGNOSIS — I2583 Coronary atherosclerosis due to lipid rich plaque: Secondary | ICD-10-CM | POA: Diagnosis not present

## 2021-07-05 MED ORDER — AZITHROMYCIN 250 MG PO TABS: ORAL_TABLET | ORAL | 0 refills | Status: AC

## 2021-07-05 NOTE — Progress Notes (Signed)
Established Patient Office Visit  Subjective:  Patient ID: Mike Decker, male    DOB: [birth year]  Age: 80 y.o. 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, esphageal   Chronic diastolic CHF (congestive heart failure) ([place])    Colon polyp    Diabetes mellitus without complication ([place])    Diverticulosis    Gout    Heart disease    Hemangioma    liver   Hyperlipidemia    Hypertension    Myocardial infarct [facility])    Ocular hypertension    Peripheral vascular disease ([place])    Skin cancer    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[place])    Sleep apnea    Vitreoretinal degeneration     Past Surgical History:  Procedure Laterality Date   CATARACT EXTRACTION  2011, 2012   COLONOSCOPY N/A 12/14/2018   Procedure: COLONOSCOPY;  Surgeon: Virgel Manifold, MD;  Location: [facility] ENDOSCOPY;  Service: Endoscopy;  Laterality: N/A;   CORONARY ANGIOPLASTY WITH STENT PLACEMENT  2012   CORONARY STENT INTERVENTION N/A 12/29/2019   Procedure: CORONARY STENT INTERVENTION;  Surgeon: Nelva Bush, MD;  Location: [place] CV LAB;  Service: Cardiovascular;  Laterality: N/A;   ESOPHAGOGASTRODUODENOSCOPY N/A 12/14/2018   Procedure: ESOPHAGOGASTRODUODENOSCOPY (EGD);  Surgeon: Virgel Manifold, MD;  Location: [facility] ENDOSCOPY;  Service: Endoscopy;  Laterality: N/A;   ESOPHAGOGASTRODUODENOSCOPY (EGD) WITH PROPOFOL N/A 06/01/2016   Procedure: ESOPHAGOGASTRODUODENOSCOPY (EGD) WITH PROPOFOL;  Surgeon: Manya Silvas, MD;  Location: [facility] ENDOSCOPY;  Service: Endoscopy;  Laterality: N/A;   HERNIA REPAIR     x2   INTRAOCULAR LENS INSERTION     LEFT HEART CATH AND CORONARY ANGIOGRAPHY N/A 05/09/2017   Procedure:  Left Heart Cath and Coronary Angiography;  Surgeon: Yolonda Kida, MD;  Location: [place] CV LAB;  Service: Cardiovascular;  Laterality: N/A;   LEFT HEART CATH AND CORONARY ANGIOGRAPHY N/A 12/08/2018   Procedure: LEFT HEART CATH AND CORONARY ANGIOGRAPHY and possible PCI and stent;  Surgeon: Yolonda Kida, MD;  Location: [place] CV LAB;  Service: Cardiovascular;  Laterality: N/A;   LEFT HEART CATH AND CORONARY ANGIOGRAPHY N/A 12/29/2019   Procedure: LEFT HEART CATH AND CORONARY ANGIOGRAPHY;  Surgeon: Nelva Bush, MD;  Location: [place] CV LAB;  Service: Cardiovascular;  Laterality: N/A;   NOSE SURGERY     submucous resection   PROSTATE SURGERY  2002   ROTATOR CUFF REPAIR Right     Family History  Problem Relation Age of Onset   Arthritis Mother    Stroke Maternal Grandfather    Breast cancer Neg Hx     Social History   Socioeconomic History   Marital status: Married    Spouse name: Not on file   Number of children: Not on file   Years of education: Not on file   Highest education level: Not on file  Occupational History   Not on file  Tobacco Use   Smoking status: Former    Years: 20.00    Types: Cigarettes    Quit date: 12/10/1976    Years since quitting: 44.5   Smokeless  tobacco: Never  Substance and Sexual Activity   Alcohol use: No   Drug use: No   Sexual activity: Not on file  Other Topics Concern   Not on file  Social History Narrative   Not on file   Social Determinants of Health   Financial Resource Strain: Not on file  Food Insecurity: Not on file  Transportation Needs: Not on file  Physical Activity: Not on file  Stress: Not on file  Social Connections: Not on file  Intimate Partner Violence: Not on file     Current Outpatient Medications:    azithromycin (ZITHROMAX) 250 MG tablet, Take 2 tablets on day 1, then 1 tablet daily on days 2 through 5, Disp: 6 tablet, Rfl: 0   acetaminophen (TYLENOL) 500 MG tablet, Take 1,000  mg by mouth every 6 (six) hours as needed for mild pain, moderate pain or headache. (Patient not taking: Reported on 06/20/2021), Disp: , Rfl:    allopurinol (ZYLOPRIM) 100 MG tablet, Take 100 mg by mouth daily. , Disp: , Rfl:    azelastine (ASTELIN) 0.1 % nasal spray, USE 1 TO 2 SPRAYS IN EACH NOSTRIL TWICE A DAY, Disp: , Rfl:    cetirizine (ZYRTEC) 5 MG tablet, Take 5 mg by mouth daily as needed for allergies or rhinitis. , Disp: , Rfl:    citalopram (CELEXA) 10 MG tablet, TAKE 1 TABLET EVERY DAY, Disp: 90 tablet, Rfl: 3   clopidogrel (PLAVIX) 75 MG tablet, Take 1 tablet by mouth daily., Disp: , Rfl:    colchicine 0.6 MG tablet, Take 3 tablets (1.8 mg total) by mouth daily as needed (gout flares)., Disp: , Rfl:    [employer] VITAMIN B12 1000 MCG tablet, TAKE 1 TABLET BY MOUTH EVERY DAY, Disp: 90 tablet, Rfl: 1   dextromethorphan-guaiFENesin (MUCINEX DM) 30-600 MG 12hr tablet, Take 1 tablet by mouth 2 (two) times daily as needed for cough., Disp: , Rfl:    EPINEPHrine (EPI-PEN) 0.3 mg/0.3 mL SOAJ injection, Inject into the muscle as needed (anaphylaxis). , Disp: , Rfl:    ferrous sulfate 325 (65 FE) MG EC tablet, TAKE 1 TABLET BY MOUTH EVERY DAY, Disp: 90 tablet, Rfl: 3   fluticasone (FLONASE) 50 MCG/ACT nasal spray, USE 1 SPRAY IN EACH NOSTRIL EVERY DAY, Disp: 32 g, Rfl: 6   Fluticasone-Umeclidin-Vilant (TRELEGY ELLIPTA) 100-62.5-25 MCG/INH AEPB, Inhale 1 puff into the lungs daily., Disp: 3 each, Rfl: 4   folic acid (FOLVITE) 007 MCG tablet, TAKE 1 TABLET (400 MCG TOTAL) BY MOUTH DAILY., Disp: 100 tablet, Rfl: 3   hydrALAZINE (APRESOLINE) 50 MG tablet, TAKE ONE TABLET BY MOUTH TWICE A DAY HOLD DOSE FOR SYSTOLIC BLOOD PRESSURE  LESS THAN 100, Disp: , Rfl:    insulin aspart (NOVOLOG) 100 UNIT/ML FlexPen, 14 UNITS IN AM, 18 UNITS AT LUNCH, 24 UNITS AT DINNER, Disp: , Rfl:    insulin glargine (LANTUS) 100 UNIT/ML injection, Inject 0.35 mLs (35 Units total) into the skin at bedtime. This is a decrease from your  previous 45 units nightly., Disp: 10 mL, Rfl: 11   ipratropium-albuterol (DUONEB) 0.5-2.5 (3) MG/3ML SOLN, Take 3 mLs by nebulization every 4 (four) hours as needed., Disp: 360 mL, Rfl: 0   isosorbide mononitrate (IMDUR) 120 MG 24 hr tablet, , Disp: , Rfl:    isosorbide mononitrate (IMDUR) 60 MG 24 hr tablet, Take 120 mg by mouth daily. , Disp: , Rfl:    magnesium oxide (MAG-OX) 400 (240 Mg) MG tablet, Take by mouth., Disp: ,  Rfl:    methocarbamol (ROBAXIN) 500 MG tablet, Take 500 mg by mouth 2 (two) times daily. , Disp: , Rfl: 0   metoprolol tartrate (LOPRESSOR) 50 MG tablet, Take 50 mg by mouth 3 (three) times daily. , Disp: , Rfl:    metoprolol tartrate (LOPRESSOR) 50 MG tablet, TAKE 1.5 TABLETS BY MOUTH TWICE A DAY, Disp: , Rfl:    montelukast (SINGULAIR) 10 MG tablet, TAKE 1 TABLET BY MOUTH EVERYDAY AT BEDTIME, Disp: 90 tablet, Rfl: 1   nitroGLYCERIN (NITROSTAT) 0.4 MG SL tablet, Place 0.4 mg under the tongue every 5 (five) minutes x 3 doses as needed for chest pain. , Disp: , Rfl:    nystatin (MYCOSTATIN) 100000 UNIT/ML suspension, Take 5 mLs (500,000 Units total) by mouth 4 (four) times daily. Swish and swallow 3 times daily for 10 days, Disp: 60 mL, Rfl: 0   nystatin (MYCOSTATIN) 100000 UNIT/ML suspension, Take 5 mLs (500,000 Units total) by mouth 4 (four) times daily., Disp: 60 mL, Rfl: 0   Omega-3 Fatty Acids (FISH OIL) 1000 MG CAPS, Take 1 capsule by mouth daily. , Disp: , Rfl:    pantoprazole (PROTONIX) 40 MG tablet, Take 1 tablet (40 mg total) by mouth 2 (two) times daily before a meal., Disp: , Rfl:    predniSONE (DELTASONE) 5 MG tablet, Take 4 tablets (20 mg total) by mouth daily with breakfast. Take 39m daily for 4 days then taper by 586mdaily for 3 days, Disp: 100 tablet, Rfl: 0   ranolazine (RANEXA) 500 MG 12 hr tablet, Take 1 tablet by mouth 2 (two) times daily., Disp: , Rfl:    rosuvastatin (CRESTOR) 40 MG tablet, Take 40 mg by mouth every evening. , Disp: , Rfl:     sacubitril-valsartan (ENTRESTO) 24-26 MG, Take 1 tablet by mouth 2 (two) times daily., Disp: 180 tablet, Rfl: 3   sodium bicarbonate 650 MG tablet, Take 650 mg by mouth 2 (two) times daily., Disp: , Rfl:    torsemide (DEMADEX) 20 MG tablet, Take 1 tablet (20 mg total) by mouth daily., Disp: 60 tablet, Rfl: 4   vitamin C (ASCORBIC ACID) 250 MG tablet, Take 250 mg by mouth daily., Disp: , Rfl:    Vitamin D, Ergocalciferol, (DRISDOL) 50000 units CAPS capsule, Take 50,000 Units by mouth every 30 (thirty) days. , Disp: , Rfl: 1   zinc gluconate 50 MG tablet, Take 50 mg by mouth daily., Disp: , Rfl:    Allergies  Allergen Reactions   Gabapentin     Other reaction(s): Other (See Comments) Tremors   Peanut-Containing Drug Products Anaphylaxis   Penicillins Hives and Rash    Has patient had a PCN reaction causing immediate rash, facial/tongue/throat swelling, SOB or lightheadedness with hypotension: Yes Has patient had a PCN reaction causing severe rash involving mucus membranes or skin necrosis: No Has patient had a PCN reaction that required hospitalization No Has patient had a PCN reaction occurring within the last 10 years: No If all of the above answers are "NO", then may proceed with Cephalosporin use.   Bee Venom Swelling   Influenza Vaccines Hives   Inh [Isoniazid] Hives   Kenalog [Triamcinolone Acetonide] Hives   Levaquin [Levofloxacin] Other (See Comments)    Tendon, ligament pain.    Lisinopril     Other reaction(s): Hyperkalemia   Nalfon [Fenoprofen Calcium] Hives   Naproxen    Peanut Oil    Nsaids Rash    Nalfon 600 Nalfon 600    ROS Review  of Systems  Constitutional: Negative.   HENT: Negative.    Eyes: Negative.   Respiratory:  Positive for cough and wheezing. Negative for chest tightness and shortness of breath.   Cardiovascular: Negative.  Negative for leg swelling.  Gastrointestinal: Negative.   Endocrine: Negative.   Genitourinary: Negative.   Musculoskeletal:  Negative.   Skin: Negative.   Allergic/Immunologic: Negative.   Neurological: Negative.  Negative for seizures.  Hematological: Negative.   Psychiatric/Behavioral: Negative.  Negative for agitation, confusion and dysphoric mood.   All other systems reviewed and are negative.    Objective:    Physical Exam Vitals reviewed.  Constitutional:      Appearance: Normal appearance.  HENT:     Mouth/Throat:     Mouth: Mucous membranes are moist.  Eyes:     Pupils: Pupils are equal, round, and reactive to light.  Neck:     Vascular: No carotid bruit.  Cardiovascular:     Rate and Rhythm: Normal rate and regular rhythm.     Pulses: Normal pulses.     Heart sounds: Normal heart sounds.  Pulmonary:     Effort: Pulmonary effort is normal.     Breath sounds: Normal breath sounds.  Abdominal:     General: Bowel sounds are normal.     Palpations: Abdomen is soft. There is no hepatomegaly, splenomegaly or mass.     Tenderness: There is no abdominal tenderness.     Hernia: No hernia is present.  Musculoskeletal:     Cervical back: Neck supple.     Right lower leg: No edema.     Left lower leg: No edema.  Skin:    Findings: No rash.  Neurological:     Mental Status: He is alert and oriented to person, place, and time.     Motor: No weakness.  Psychiatric:        Mood and Affect: Mood normal.        Behavior: Behavior normal.    BP (!) 145/65   Pulse (!) 57   Ht 5' 6" (1.676 m)   Wt 200 lb 3.2 oz (90.8 kg)   BMI 32.31 kg/m  Wt Readings from Last 3 Encounters:  07/05/21 200 lb 3.2 oz (90.8 kg)  06/28/21 198 lb 12.8 oz (90.2 kg)  06/20/21 200 lb (90.7 kg)     Health Maintenance Due  Topic Date Due   FOOT EXAM  Never done   Zoster Vaccines- Shingrix (1 of 2) Never done   COVID-19 Vaccine (3 - Pfizer risk series) 03/07/2020   HEMOGLOBIN A1C  06/13/2020    There are no preventive care reminders to display for this patient.  Lab Results  Component Value Date   TSH 1.948  12/06/2018   Lab Results  Component Value Date   WBC 9.9 06/28/2021   HGB 14.1 06/28/2021   HCT 44.1 06/28/2021   MCV 97.6 06/28/2021   PLT 246 06/28/2021   Lab Results  Component Value Date   NA 135 06/28/2021   K 4.4 06/28/2021   CO2 27 06/28/2021   GLUCOSE 233 (H) 06/28/2021   BUN 52 (H) 06/28/2021   CREATININE 2.35 (H) 06/28/2021   BILITOT 0.4 06/28/2021   ALKPHOS 65 04/18/2020   AST 11 06/28/2021   ALT 9 06/28/2021   PROT 7.1 06/28/2021   ALBUMIN 4.3 04/18/2020   CALCIUM 9.4 06/28/2021   ANIONGAP 11 07/07/2020   EGFR 27 (L) 06/28/2021   Lab Results  Component Value Date  CHOL 115 12/29/2019   Lab Results  Component Value Date   HDL 31 (L) 12/29/2019   Lab Results  Component Value Date   LDLCALC 51 12/29/2019   Lab Results  Component Value Date   TRIG 163 (H) 12/29/2019   Lab Results  Component Value Date   CHOLHDL 3.7 12/29/2019   Lab Results  Component Value Date   HGBA1C 8.7 (H) 12/15/2019      Assessment & Plan:   Problem List Items Addressed This Visit       Cardiovascular and Mediastinum   Essential hypertension - Primary     Patient denies any chest pain or shortness of breath there is no history of palpitation or paroxysmal nocturnal dyspnea   patient was advised to follow low-salt low-cholesterol diet    ideally I want to keep systolic blood pressure below 130 mmHg, patient was asked to check blood pressure one times a week and give me a report on that.  Patient will be follow-up in 3 months  or earlier as needed, patient will call me back for any change in the cardiovascular symptoms Patient was advised to buy a book from local bookstore concerning blood pressure and read several chapters  every day.  This will be supplemented by some of the material we will give him from the office.  Patient should also utilize other resources like YouTube and Internet to learn more about the blood pressure and the diet.       CAD (coronary artery  disease)    Denies any chest pain or shortness of breath.  No palpitation no swelling of the legs.       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[place])    Advised to resume self every day, follow low-salt diet         Respiratory   Chronic obstructive pulmonary disease ([place])    Patient has quit smoking completely       Relevant Medications   azithromycin (ZITHROMAX) 250 MG tablet   OSA (obstructive sleep apnea)   Bronchitis    Will use Z-Pak as directed, refer to pulmonologist       Relevant Medications   azithromycin (ZITHROMAX) 250 MG tablet     Genitourinary   CKD (chronic kidney disease) stage 4, GFR 15-29 ml/min (HCC)    Referred to nephrologist        Meds ordered this encounter  Medications   azithromycin (ZITHROMAX) 250 MG tablet    Sig: Take 2 tablets on day 1, then 1 tablet daily on days 2 through 5    Dispense:  6 tablet    Refill:  0    Follow-up: No follow-ups on file.    Cletis Athens, MD

## 2021-07-05 NOTE — Assessment & Plan Note (Signed)
Advised to resume self every day, follow low-salt diet

## 2021-07-05 NOTE — Assessment & Plan Note (Signed)
Will use Z-Pak as directed, refer to pulmonologist

## 2021-07-05 NOTE — Assessment & Plan Note (Signed)
Referred to nephrologist.

## 2021-07-05 NOTE — Assessment & Plan Note (Signed)
Patient has quit smoking completely 

## 2021-07-05 NOTE — Assessment & Plan Note (Signed)
Patient is taking statin he was advised to follow low-cholesterol diet

## 2021-07-05 NOTE — Assessment & Plan Note (Signed)
Denies any chest pain or shortness of breath.  No palpitation no swelling of the legs.

## 2021-07-05 NOTE — Assessment & Plan Note (Signed)
Patient denies any chest pain or shortness of breath there is no history of palpitation or paroxysmal nocturnal dyspnea °  patient was advised to follow low-salt low-cholesterol diet ° °  ideally I want to keep systolic blood pressure below 130 mmHg, patient was asked to check blood pressure one times a week and give me a report on that.  Patient will be follow-up in 3 months  or earlier as needed, patient will call me back for any change in the cardiovascular symptoms °Patient was advised to buy a book from local bookstore concerning blood pressure and read several chapters  every day.  This will be supplemented by some of the material we will give him from the office.  Patient should also utilize other resources like YouTube and Internet to learn more about the blood pressure and the diet. °

## 2021-07-11 ENCOUNTER — Telehealth: Payer: Self-pay

## 2021-07-11 NOTE — Telephone Encounter (Signed)
Pt called requesting samples of Trelegy that he was in the donut hole.  Gave pt 2 samples placed up front for him to pick up

## 2021-07-18 DIAGNOSIS — E1159 Type 2 diabetes mellitus with other circulatory complications: Secondary | ICD-10-CM | POA: Diagnosis not present

## 2021-07-18 DIAGNOSIS — E1122 Type 2 diabetes mellitus with diabetic chronic kidney disease: Secondary | ICD-10-CM | POA: Diagnosis not present

## 2021-07-18 DIAGNOSIS — N1832 Chronic kidney disease, stage 3b: Secondary | ICD-10-CM | POA: Diagnosis not present

## 2021-07-18 DIAGNOSIS — E1165 Type 2 diabetes mellitus with hyperglycemia: Secondary | ICD-10-CM | POA: Diagnosis not present

## 2021-07-18 DIAGNOSIS — E1142 Type 2 diabetes mellitus with diabetic polyneuropathy: Secondary | ICD-10-CM | POA: Diagnosis not present

## 2021-07-18 DIAGNOSIS — E1129 Type 2 diabetes mellitus with other diabetic kidney complication: Secondary | ICD-10-CM | POA: Diagnosis not present

## 2021-07-18 DIAGNOSIS — Z794 Long term (current) use of insulin: Secondary | ICD-10-CM | POA: Diagnosis not present

## 2021-07-18 DIAGNOSIS — R809 Proteinuria, unspecified: Secondary | ICD-10-CM | POA: Diagnosis not present

## 2021-07-18 LAB — HEMOGLOBIN A1C: Hemoglobin A1C: 8.3

## 2021-07-20 ENCOUNTER — Other Ambulatory Visit
Admission: RE | Admit: 2021-07-20 | Discharge: 2021-07-20 | Disposition: A | Discharge status: Home / Self Care | Payer: Medicare Other | Source: Ambulatory Visit | Attending: Internal Medicine | Admitting: Internal Medicine

## 2021-07-20 DIAGNOSIS — R053 Chronic cough: Secondary | ICD-10-CM | POA: Diagnosis not present

## 2021-07-20 DIAGNOSIS — I5032 Chronic diastolic (congestive) heart failure: Secondary | ICD-10-CM | POA: Insufficient documentation

## 2021-07-20 DIAGNOSIS — R059 Cough, unspecified: Secondary | ICD-10-CM | POA: Diagnosis not present

## 2021-07-20 DIAGNOSIS — G4733 Obstructive sleep apnea (adult) (pediatric): Secondary | ICD-10-CM | POA: Diagnosis not present

## 2021-07-20 DIAGNOSIS — E669 Obesity, unspecified: Secondary | ICD-10-CM | POA: Diagnosis not present

## 2021-07-20 DIAGNOSIS — R0602 Shortness of breath: Secondary | ICD-10-CM | POA: Diagnosis not present

## 2021-07-20 DIAGNOSIS — Z955 Presence of coronary angioplasty implant and graft: Secondary | ICD-10-CM | POA: Diagnosis not present

## 2021-07-20 DIAGNOSIS — E119 Type 2 diabetes mellitus without complications: Secondary | ICD-10-CM | POA: Diagnosis not present

## 2021-07-20 DIAGNOSIS — R002 Palpitations: Secondary | ICD-10-CM | POA: Diagnosis not present

## 2021-07-20 DIAGNOSIS — I25118 Atherosclerotic heart disease of native coronary artery with other forms of angina pectoris: Secondary | ICD-10-CM | POA: Diagnosis not present

## 2021-07-20 DIAGNOSIS — I10 Essential (primary) hypertension: Secondary | ICD-10-CM | POA: Diagnosis not present

## 2021-07-20 DIAGNOSIS — R058 Other specified cough: Secondary | ICD-10-CM | POA: Diagnosis not present

## 2021-07-20 DIAGNOSIS — N184 Chronic kidney disease, stage 4 (severe): Secondary | ICD-10-CM | POA: Diagnosis not present

## 2021-07-20 LAB — BRAIN NATRIURETIC PEPTIDE: B Natriuretic Peptide: 107.6 pg/mL — ABNORMAL HIGH (ref 0.0–100.0)

## 2021-08-02 DIAGNOSIS — E872 Acidosis: Secondary | ICD-10-CM | POA: Diagnosis not present

## 2021-08-02 DIAGNOSIS — E875 Hyperkalemia: Secondary | ICD-10-CM | POA: Diagnosis not present

## 2021-08-02 DIAGNOSIS — N2581 Secondary hyperparathyroidism of renal origin: Secondary | ICD-10-CM | POA: Diagnosis not present

## 2021-08-02 DIAGNOSIS — E871 Hypo-osmolality and hyponatremia: Secondary | ICD-10-CM | POA: Diagnosis not present

## 2021-08-02 DIAGNOSIS — E1122 Type 2 diabetes mellitus with diabetic chronic kidney disease: Secondary | ICD-10-CM | POA: Diagnosis not present

## 2021-08-02 DIAGNOSIS — I129 Hypertensive chronic kidney disease with stage 1 through stage 4 chronic kidney disease, or unspecified chronic kidney disease: Secondary | ICD-10-CM | POA: Diagnosis not present

## 2021-08-02 DIAGNOSIS — N184 Chronic kidney disease, stage 4 (severe): Secondary | ICD-10-CM | POA: Diagnosis not present

## 2021-08-02 DIAGNOSIS — D631 Anemia in chronic kidney disease: Secondary | ICD-10-CM | POA: Diagnosis not present

## 2021-08-03 DIAGNOSIS — J019 Acute sinusitis, unspecified: Secondary | ICD-10-CM | POA: Diagnosis not present

## 2021-08-03 DIAGNOSIS — R490 Dysphonia: Secondary | ICD-10-CM | POA: Diagnosis not present

## 2021-08-09 DIAGNOSIS — E1122 Type 2 diabetes mellitus with diabetic chronic kidney disease: Secondary | ICD-10-CM | POA: Diagnosis not present

## 2021-08-09 DIAGNOSIS — N184 Chronic kidney disease, stage 4 (severe): Secondary | ICD-10-CM | POA: Diagnosis not present

## 2021-08-09 DIAGNOSIS — E872 Acidosis: Secondary | ICD-10-CM | POA: Diagnosis not present

## 2021-08-09 DIAGNOSIS — I129 Hypertensive chronic kidney disease with stage 1 through stage 4 chronic kidney disease, or unspecified chronic kidney disease: Secondary | ICD-10-CM | POA: Diagnosis not present

## 2021-08-09 DIAGNOSIS — E875 Hyperkalemia: Secondary | ICD-10-CM | POA: Diagnosis not present

## 2021-08-09 DIAGNOSIS — D631 Anemia in chronic kidney disease: Secondary | ICD-10-CM | POA: Diagnosis not present

## 2021-08-09 DIAGNOSIS — N2581 Secondary hyperparathyroidism of renal origin: Secondary | ICD-10-CM | POA: Diagnosis not present

## 2021-08-17 ENCOUNTER — Encounter: Payer: Self-pay | Admitting: Physician Assistant

## 2021-08-17 ENCOUNTER — Other Ambulatory Visit: Payer: Self-pay

## 2021-08-17 ENCOUNTER — Ambulatory Visit (INDEPENDENT_AMBULATORY_CARE_PROVIDER_SITE_OTHER): Payer: Medicare Other | Admitting: Physician Assistant

## 2021-08-17 DIAGNOSIS — I6523 Occlusion and stenosis of bilateral carotid arteries: Secondary | ICD-10-CM | POA: Diagnosis not present

## 2021-08-17 DIAGNOSIS — R0602 Shortness of breath: Secondary | ICD-10-CM

## 2021-08-17 DIAGNOSIS — I5022 Chronic systolic (congestive) heart failure: Secondary | ICD-10-CM | POA: Diagnosis not present

## 2021-08-17 DIAGNOSIS — J4520 Mild intermittent asthma, uncomplicated: Secondary | ICD-10-CM

## 2021-08-17 DIAGNOSIS — Z9989 Dependence on other enabling machines and devices: Secondary | ICD-10-CM | POA: Diagnosis not present

## 2021-08-17 DIAGNOSIS — G4733 Obstructive sleep apnea (adult) (pediatric): Secondary | ICD-10-CM

## 2021-08-17 DIAGNOSIS — Z7189 Other specified counseling: Secondary | ICD-10-CM

## 2021-08-17 NOTE — Progress Notes (Signed)
[facility] [place], [place] [address]  Pulmonary Sleep Medicine   Office Visit Note  Patient Name: Mike Decker DOB: Nov 17, 1941 MRN [id number]  Date of Service: 08/17/2021  Complaints/HPI: Pt is here for routine pulmonary visit. Cardiology changed him to torsemide due to swelling and HF. More swelling in chest/abdomen, not as much in LE. Does related SOB related to this. Received new CPAP and is doing well on it. Does report he was placed on prednisone and ABX last week due to a sinus infection and is starting to feel better. Completed the Cefdinir Sat. Using trelegy daily and requests samples due to being in the donut hole. Sample given in office.  ROS  General: (-) fever, (-) chills, (-) night sweats, (-) weakness Skin: (-) rashes, (-) itching,. Eyes: (-) visual changes, (-) redness, (-) itching. Nose and Sinuses: (-) nasal stuffiness or itchiness, (-) postnasal drip, (-) nosebleeds, (-) sinus trouble. Mouth and Throat: (-) sore throat, (-) hoarseness. Neck: (-) swollen glands, (-) enlarged thyroid, (-) neck pain. Respiratory: - cough, (-) bloody sputum, + shortness of breath, - wheezing. Cardiovascular: - ankle swelling, (-) chest pain. Lymphatic: (-) lymph node enlargement. Neurologic: (-) numbness, (-) tingling. Psychiatric: (-) anxiety, (-) depression   Current Medication: Outpatient Encounter Medications as of 08/17/2021  Medication Sig Note   allopurinol (ZYLOPRIM) 100 MG tablet Take 100 mg by mouth daily.     azelastine (ASTELIN) 0.1 % nasal spray USE 1 TO 2 SPRAYS IN EACH NOSTRIL TWICE A DAY    cetirizine (ZYRTEC) 5 MG tablet Take 5 mg by mouth daily as needed for allergies or rhinitis.     citalopram (CELEXA) 10 MG tablet TAKE 1 TABLET EVERY DAY    clopidogrel (PLAVIX) 75 MG tablet Take 1 tablet by mouth daily.    colchicine 0.6 MG tablet Take 3 tablets (1.8 mg total) by mouth daily as needed (gout flares).    [employer] VITAMIN B12 1000 MCG  tablet TAKE 1 TABLET BY MOUTH EVERY DAY    dextromethorphan-guaiFENesin (MUCINEX DM) 30-600 MG 12hr tablet Take 1 tablet by mouth 2 (two) times daily as needed for cough.    EPINEPHrine (EPI-PEN) 0.3 mg/0.3 mL SOAJ injection Inject into the muscle as needed (anaphylaxis).     ferrous sulfate 325 (65 FE) MG EC tablet TAKE 1 TABLET BY MOUTH EVERY DAY    fluticasone (FLONASE) 50 MCG/ACT nasal spray USE 1 SPRAY IN EACH NOSTRIL EVERY DAY    Fluticasone-Umeclidin-Vilant (TRELEGY ELLIPTA) 100-62.5-25 MCG/INH AEPB Inhale 1 puff into the lungs daily.    folic acid (FOLVITE) 789 MCG tablet TAKE 1 TABLET (400 MCG TOTAL) BY MOUTH DAILY.    hydrALAZINE (APRESOLINE) 50 MG tablet TAKE ONE TABLET BY MOUTH TWICE A DAY HOLD DOSE FOR SYSTOLIC BLOOD PRESSURE  LESS THAN 100    insulin aspart (NOVOLOG) 100 UNIT/ML FlexPen 15 UNITS IN AM, 25 UNITS AT LUNCH, 25 UNITS AT DINNER    insulin glargine (LANTUS) 100 UNIT/ML injection Inject 0.35 mLs (35 Units total) into the skin at bedtime. This is a decrease from your previous 45 units nightly. (Patient taking differently: Inject 60 Units into the skin at bedtime. This is a decrease from your previous 45 units nightly.)    ipratropium-albuterol (DUONEB) 0.5-2.5 (3) MG/3ML SOLN Take 3 mLs by nebulization every 4 (four) hours as needed.    isosorbide mononitrate (IMDUR) 120 MG 24 hr tablet     isosorbide mononitrate (IMDUR) 60 MG 24 hr tablet Take 120  mg by mouth daily.     magnesium oxide (MAG-OX) 400 (240 Mg) MG tablet Take by mouth.    methocarbamol (ROBAXIN) 500 MG tablet Take 500 mg by mouth 2 (two) times daily.     metoprolol tartrate (LOPRESSOR) 50 MG tablet Take 50 mg by mouth 3 (three) times daily.     metoprolol tartrate (LOPRESSOR) 50 MG tablet TAKE 1.5 TABLETS BY MOUTH TWICE A DAY    montelukast (SINGULAIR) 10 MG tablet TAKE 1 TABLET BY MOUTH EVERYDAY AT BEDTIME    nitroGLYCERIN (NITROSTAT) 0.4 MG SL tablet Place 0.4 mg under the tongue every 5 (five) minutes x 3  doses as needed for chest pain.     nystatin (MYCOSTATIN) 100000 UNIT/ML suspension Take 5 mLs (500,000 Units total) by mouth 4 (four) times daily. Swish and swallow 3 times daily for 10 days    Omega-3 Fatty Acids (FISH OIL) 1000 MG CAPS Take 1 capsule by mouth daily.     pantoprazole (PROTONIX) 40 MG tablet Take 1 tablet (40 mg total) by mouth 2 (two) times daily before a meal.    ranolazine (RANEXA) 500 MG 12 hr tablet Take 1 tablet by mouth 2 (two) times daily.    rosuvastatin (CRESTOR) 40 MG tablet Take 40 mg by mouth every evening.     sacubitril-valsartan (ENTRESTO) 24-26 MG Take 1 tablet by mouth 2 (two) times daily.    sodium bicarbonate 650 MG tablet Take 650 mg by mouth 2 (two) times daily.    Torsemide 40 MG TABS Take 40 mg by mouth once daily    vitamin C (ASCORBIC ACID) 250 MG tablet Take 250 mg by mouth daily.    Vitamin D, Ergocalciferol, (DRISDOL) 50000 units CAPS capsule Take 50,000 Units by mouth every 30 (thirty) days.  12/14/2019: 23rd    zinc gluconate 50 MG tablet Take 50 mg by mouth daily.    [DISCONTINUED] nystatin (MYCOSTATIN) 100000 UNIT/ML suspension Take 5 mLs (500,000 Units total) by mouth 4 (four) times daily.    [DISCONTINUED] predniSONE (DELTASONE) 5 MG tablet Take 4 tablets (20 mg total) by mouth daily with breakfast. Take $RemoveBeforeD'20mg'GUOAZdBhEtffdc$  daily for 4 days then taper by $Remove'5mg'LlXKtUd$  daily for 3 days    [DISCONTINUED] torsemide (DEMADEX) 20 MG tablet Take 1 tablet (20 mg total) by mouth daily. (Patient taking differently: Take 40 mg by mouth daily.)    [DISCONTINUED] acetaminophen (TYLENOL) 500 MG tablet Take 1,000 mg by mouth every 6 (six) hours as needed for mild pain, moderate pain or headache. (Patient not taking: Reported on 06/20/2021)    No facility-administered encounter medications on file as of 08/17/2021.    Surgical History: Past Surgical History:  Procedure Laterality Date   CATARACT EXTRACTION  2011, 2012   COLONOSCOPY N/A 12/14/2018   Procedure: COLONOSCOPY;  Surgeon:  Virgel Manifold, MD;  Location: [facility] ENDOSCOPY;  Service: Endoscopy;  Laterality: N/A;   CORONARY ANGIOPLASTY WITH STENT PLACEMENT  2012   CORONARY STENT INTERVENTION N/A 12/29/2019   Procedure: CORONARY STENT INTERVENTION;  Surgeon: Nelva Bush, MD;  Location: [place] [facility];  Service: Cardiovascular;  Laterality: N/A;   ESOPHAGOGASTRODUODENOSCOPY N/A 12/14/2018   Procedure: ESOPHAGOGASTRODUODENOSCOPY (EGD);  Surgeon: Virgel Manifold, MD;  Location: [facility] ENDOSCOPY;  Service: Endoscopy;  Laterality: N/A;   ESOPHAGOGASTRODUODENOSCOPY (EGD) WITH PROPOFOL N/A 06/01/2016   Procedure: ESOPHAGOGASTRODUODENOSCOPY (EGD) WITH PROPOFOL;  Surgeon: Manya Silvas, MD;  Location: [facility] ENDOSCOPY;  Service: Endoscopy;  Laterality: N/A;   HERNIA REPAIR     x2  INTRAOCULAR LENS INSERTION     LEFT HEART CATH AND CORONARY ANGIOGRAPHY N/A 05/09/2017   Procedure: Left Heart Cath and Coronary Angiography;  Surgeon: Yolonda Kida, MD;  Location: [place] [facility];  Service: Cardiovascular;  Laterality: N/A;   LEFT HEART CATH AND CORONARY ANGIOGRAPHY N/A 12/08/2018   Procedure: LEFT HEART CATH AND CORONARY ANGIOGRAPHY and possible PCI and stent;  Surgeon: Yolonda Kida, MD;  Location: [place] [facility];  Service: Cardiovascular;  Laterality: N/A;   LEFT HEART CATH AND CORONARY ANGIOGRAPHY N/A 12/29/2019   Procedure: LEFT HEART CATH AND CORONARY ANGIOGRAPHY;  Surgeon: Nelva Bush, MD;  Location: [place] [facility];  Service: Cardiovascular;  Laterality: N/A;   NOSE SURGERY     submucous resection   PROSTATE SURGERY  2002   ROTATOR CUFF REPAIR Right     Medical History: Past Medical History:  Diagnosis Date   Anemia    Arthritis    Atrioventricular canal (AVC)    irregular heart beats   Barrett esophagus    Bronchiolitis    Cancer ([place]) 2002   prostate, esphageal   Chronic diastolic CHF (congestive heart failure) (HCC)    Colon polyp    Diabetes mellitus without  complication ([place])    Diverticulosis    Gout    Heart disease    Hemangioma    liver   Hyperlipidemia    Hypertension    Myocardial infarct ([place])    Ocular hypertension    Peripheral vascular disease ([place])    Skin cancer    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[place])    Sleep apnea    Vitreoretinal degeneration     Family History: Family History  Problem Relation Age of Onset   Arthritis Mother    Stroke Maternal Grandfather    Breast cancer Neg Hx     Social History: Social History   Socioeconomic History   Marital status: Married    Spouse name: Not on file   Number of children: Not on file   Years of education: Not on file   Highest education level: Not on file  Occupational History   Not on file  Tobacco Use   Smoking status: Former    Years: 20.00    Types: Cigarettes    Quit date: 12/10/1976    Years since quitting: 44.7   Smokeless tobacco: Never  Substance and Sexual Activity   Alcohol use: No   Drug use: No   Sexual activity: Not on file  Other Topics Concern   Not on file  Social History Narrative   Not on file   Social Determinants of Health   Financial Resource Strain: Not on file  Food Insecurity: Not on file  Transportation Needs: Not on file  Physical Activity: Not on file  Stress: Not on file  Social Connections: Not on file  Intimate Partner Violence: Not on file    Vital Signs: Blood pressure 140/78, pulse (!) 55, temperature 97.8 F (36.6 C), resp. rate 16, height $RemoveBe'5\' 6"'UIjKhnHFU$  (1.676 m), weight 205 lb (93 kg), SpO2 95 %.  Examination: General Appearance: The patient is well-developed, well-nourished, and in no distress. Skin: Gross inspection of skin unremarkable. Head: normocephalic, no gross deformities. Eyes: no gross deformities noted. ENT: ears appear grossly normal no exudates. Neck: Supple. No thyromegaly. No LAD. Respiratory: Lungs clear to auscultation bilaterally. Cardiovascular: Normal S1 and S2 without murmur or rub. Extremities: No cyanosis.  pulses are equal. Neurologic: Alert and oriented. No involuntary movements.  LABS:  Recent Results (from the past 2160 hour(s))  CBC with Differential/Platelet     Status: Abnormal   Collection Time: 06/28/21 10:13 AM  Result Value Ref Range   WBC 9.9 3.8 - 10.8 Thousand/uL   RBC 4.52 4.20 - 5.80 Million/uL   Hemoglobin 14.1 13.2 - 17.1 g/dL   HCT 44.1 38.5 - 50.0 %   MCV 97.6 80.0 - 100.0 fL   MCH 31.2 27.0 - 33.0 pg   MCHC 32.0 32.0 - 36.0 g/dL   RDW 13.5 11.0 - 15.0 %   Platelets 246 140 - 400 Thousand/uL   MPV 10.0 7.5 - 12.5 fL   Neutro Abs 6,752 1,500 - 7,800 cells/uL   Lymphs Abs 1,228 850 - 3,900 cells/uL   Absolute Monocytes 851 200 - 950 cells/uL   Eosinophils Absolute 970 (H) 15 - 500 cells/uL   Basophils Absolute 99 0 - 200 cells/uL   Neutrophils Relative % 68.2 %   Total Lymphocyte 12.4 %   Monocytes Relative 8.6 %   Eosinophils Relative 9.8 %   Basophils Relative 1.0 %  COMPLETE METABOLIC PANEL WITH GFR     Status: Abnormal   Collection Time: 06/28/21 10:13 AM  Result Value Ref Range   Glucose, Bld 233 (H) 65 - 99 mg/dL    Comment: .            Fasting reference interval . For someone without known diabetes, a glucose value >125 mg/dL indicates that they may have diabetes and this should be confirmed with a follow-up test. .    BUN 52 (H) 7 - 25 mg/dL   Creat 2.35 (H) 0.70 - 1.22 mg/dL   eGFR 27 (L) > OR = 60 mL/min/1.41m2    Comment: The eGFR is based on the CKD-EPI 2021 equation. To calculate  the new eGFR from a previous Creatinine or Cystatin C result, go to https://www.kidney.org/professionals/ kdoqi/gfr%5Fcalculator    BUN/Creatinine Ratio 22 6 - 22 (calc)   Sodium 135 135 - 146 mmol/L   Potassium 4.4 3.5 - 5.3 mmol/L   Chloride 97 (L) 98 - 110 mmol/L   CO2 27 20 - 32 mmol/L   Calcium 9.4 8.6 - 10.3 mg/dL   Total Protein 7.1 6.1 - 8.1 g/dL   Albumin 4.8 3.6 - 5.1 g/dL   Globulin 2.3 1.9 - 3.7 g/dL (calc)   AG Ratio 2.1 1.0 - 2.5 (calc)    Total Bilirubin 0.4 0.2 - 1.2 mg/dL   Alkaline phosphatase (APISO) 69 35 - 144 U/L   AST 11 10 - 35 U/L   ALT 9 9 - 46 U/L  Brain natriuretic peptide     Status: Abnormal   Collection Time: 07/20/21 12:04 PM  Result Value Ref Range   B Natriuretic Peptide 107.6 (H) 0.0 - 100.0 pg/mL    Comment: Performed at [facility], [address], [place], [place] [address]    Radiology: No results found.  No results found.  No results found.    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[place]) 06/28/2021   Urinary tract infection without hematuria 11/01/2020   Dysuria 11/01/2020   Hyposmolality and/or hyponatremia 09/14/2020   Cough 09/13/2020   Bronchitis 07/15/2020   Palpitations 07/15/2020   Aortic atherosclerosis ([place]) 06/21/2020   Heart failure ([place]) 06/21/2020   Otalgia of both ears 05/27/2020   Lobar pneumonia ([place])    Type 2 diabetes mellitus with stage 4 chronic  kidney disease, without long-term current use of insulin ([place]) 12/14/2019   Elevated troponin    Acidosis 10/30/2019   Benign 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 10/30/2019   Hyperkalemia 10/30/2019   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   Encounter for fitting and adjustment of hearing aid 06/16/2019   Health maintenance alteration 06/16/2019   Prostate cancer ([place]) 06/16/2019   DM type 2 with diabetic peripheral neuropathy ([place]) 01/25/2019   CKD (chronic kidney disease) stage 4, GFR 15-29 ml/min (HCC) 01/21/2019   Gout 01/21/2019   MR (mitral regurgitation) 01/21/2019   Chronic obstructive pulmonary disease ([place]) 01/08/2019   OSA (obstructive sleep apnea) 01/08/2019   Chronic diastolic heart failure ([place]) 12/30/2018   Polyp of sigmoid colon    Benign neoplasm of ascending colon    Hemorrhoids without complication    Diverticulosis of  large intestine without diverticulitis    Reflux esophagitis    Lymphangiectasia    Arteriovenous malformation of duodenum    Symptomatic anemia    SOB (shortness of breath)    Iron deficiency anemia due to chronic blood loss    Unstable angina (HCC)    Anemia of chronic renal failure, stage 4 (severe) ([place])    Chest pain 11/14/2018   Sciatica 11/12/2018   CAD (coronary artery disease) 02/27/2018   Sacroiliac joint pain 10/08/2017   Pain in limb 09/24/2017   Neuropathy 07/24/2017   NSTEMI (non-ST elevated myocardial infarction) ([place]) 05/06/2017   PAD (peripheral artery disease) ([place]) 05/01/2017   History of stroke 01/02/2017   Barrett's esophagus 10/02/2016   Carotid stenosis 10/02/2016   Essential hypertension 10/02/2016   Hyperlipidemia 10/02/2016   Diabetes ([place]) 10/02/2016   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 OSA on CPAP Continue nightly use  2. CPAP use counseling CPAP couseling-Discussed importance of adequate CPAP use as well as proper care and cleaning techniques of machine and all supplies.  3. Chronic systolic congestive heart failure ([place]) Followed by cardiology with recent change to 31m Torsemide  4. Chronic asthma, mild intermittent, uncomplicated Stable, continue trelegy  5. Shortness of breath - Spirometry with Graph was normal today   General Counseling: I have discussed the findings of the evaluation and examination with BMarland Kitchen  I have also discussed any further diagnostic evaluation thatmay be needed or ordered today. BGershonverbalizes understanding of the findings of todays visit. We also reviewed his medications today and discussed drug interactions and side effects including but not limited excessive drowsiness and altered mental states. We also discussed  that there is always a risk not just to him but also people around him. he has been encouraged to call the office with any questions or concerns that should arise related to todays visit.  Orders Placed This Encounter  Procedures   Spirometry with Graph    Order Specific Question:   Where should this test be performed?    Answer:   Other     Time spent: 30  I have personally obtained a history, examined the patient, evaluated laboratory and imaging results, formulated the assessment and plan and placed orders. This patient was seen by LDrema Dallas PA-C in collaboration with Dr. SDevona Konigas a part of collaborative care agreement.     SAllyne Gee MD  [facility] Pulmonary and Critical Care Sleep medicine

## 2021-09-07 DIAGNOSIS — K225 Diverticulum of esophagus, acquired: Secondary | ICD-10-CM | POA: Diagnosis not present

## 2021-09-07 DIAGNOSIS — R1319 Other dysphagia: Secondary | ICD-10-CM | POA: Diagnosis not present

## 2021-09-07 DIAGNOSIS — K219 Gastro-esophageal reflux disease without esophagitis: Secondary | ICD-10-CM | POA: Diagnosis not present

## 2021-09-07 DIAGNOSIS — K449 Diaphragmatic hernia without obstruction or gangrene: Secondary | ICD-10-CM | POA: Diagnosis not present

## 2021-09-08 ENCOUNTER — Ambulatory Visit: Payer: Medicare Other

## 2021-09-15 ENCOUNTER — Ambulatory Visit (INDEPENDENT_AMBULATORY_CARE_PROVIDER_SITE_OTHER): Payer: Medicare Other

## 2021-09-15 DIAGNOSIS — Z0000 Encounter for general adult medical examination without abnormal findings: Secondary | ICD-10-CM | POA: Diagnosis not present

## 2021-09-15 NOTE — Progress Notes (Signed)
Subjective:   Mike Decker is a 80 y.o. male who presents for Medicare Annual/Subsequent preventive examination. I discussed the limitations of evaluation and management by telemedicine and the availability of in person appointments. The patient expressed understanding and agreed to proceed.   Visit performed by audio   Patient location: Home  Provider location: Home   Review of Systems    N/A Cardiac Risk Factors include: diabetes mellitus;hypertension;family history of premature cardiovascular disease     Objective:    There were no vitals filed for this visit. There is no height or weight on file to calculate BMI.  Advanced Directives 09/15/2021 05/24/2020 04/18/2020 12/29/2019 12/29/2019 12/28/2019 12/15/2019  Does Patient Have a Medical Advance Directive? Yes No No;Yes No Yes Yes Yes  Type of Paramedic of [place];Living will [place];Living will Healthcare Power of [place];Living will [place];Living will Living will;Healthcare Power of Attorney  Does patient want to make changes to medical advance directive? No - Patient declined - - - No - Patient declined - No - Patient declined  Copy of [place] in Chart? No - copy requested - - - No - copy requested - -  Would patient like information on creating a medical advance directive? - - - No - Patient declined - - -    Current Medications (verified) Outpatient Encounter Medications as of 09/15/2021  Medication Sig   allopurinol (ZYLOPRIM) 100 MG tablet Take 100 mg by mouth daily.    azelastine (ASTELIN) 0.1 % nasal spray USE 1 TO 2 SPRAYS IN EACH NOSTRIL TWICE A DAY   cetirizine (ZYRTEC) 5 MG tablet Take 5 mg by mouth daily as needed for allergies or rhinitis.    citalopram (CELEXA) 10 MG tablet TAKE 1 TABLET EVERY DAY   clopidogrel (PLAVIX) 75 MG tablet Take 1 tablet by mouth daily.   colchicine 0.6 MG  tablet Take 3 tablets (1.8 mg total) by mouth daily as needed (gout flares).   [employer] VITAMIN B12 1000 MCG tablet TAKE 1 TABLET BY MOUTH EVERY DAY   dextromethorphan-guaiFENesin (MUCINEX DM) 30-600 MG 12hr tablet Take 1 tablet by mouth 2 (two) times daily as needed for cough.   EPINEPHrine (EPI-PEN) 0.3 mg/0.3 mL SOAJ injection Inject into the muscle as needed (anaphylaxis).    ferrous sulfate 325 (65 FE) MG EC tablet TAKE 1 TABLET BY MOUTH EVERY DAY   fluticasone (FLONASE) 50 MCG/ACT nasal spray USE 1 SPRAY IN EACH NOSTRIL EVERY DAY   Fluticasone-Umeclidin-Vilant (TRELEGY ELLIPTA) 100-62.5-25 MCG/INH AEPB Inhale 1 puff into the lungs daily.   folic acid (FOLVITE) 299 MCG tablet TAKE 1 TABLET (400 MCG TOTAL) BY MOUTH DAILY.   hydrALAZINE (APRESOLINE) 50 MG tablet TAKE ONE TABLET BY MOUTH TWICE A DAY HOLD DOSE FOR SYSTOLIC BLOOD PRESSURE  LESS THAN 100   insulin aspart (NOVOLOG) 100 UNIT/ML FlexPen 15 UNITS IN AM, 25 UNITS AT LUNCH, 25 UNITS AT DINNER   insulin glargine (LANTUS) 100 UNIT/ML injection Inject 0.35 mLs (35 Units total) into the skin at bedtime. This is a decrease from your previous 45 units nightly. (Patient taking differently: Inject 60 Units into the skin at bedtime. This is a decrease from your previous 45 units nightly.)   ipratropium-albuterol (DUONEB) 0.5-2.5 (3) MG/3ML SOLN Take 3 mLs by nebulization every 4 (four) hours as needed.   isosorbide mononitrate (IMDUR) 120 MG 24 hr tablet    magnesium oxide (MAG-OX) 400 (240  Mg) MG tablet Take by mouth.   methocarbamol (ROBAXIN) 500 MG tablet Take 500 mg by mouth 2 (two) times daily.    metoprolol tartrate (LOPRESSOR) 50 MG tablet Take 50 mg by mouth 2 (two) times daily.   montelukast (SINGULAIR) 10 MG tablet TAKE 1 TABLET BY MOUTH EVERYDAY AT BEDTIME   nitroGLYCERIN (NITROSTAT) 0.4 MG SL tablet Place 0.4 mg under the tongue every 5 (five) minutes x 3 doses as needed for chest pain.    Omega-3 Fatty Acids (FISH OIL) 1000 MG CAPS Take 1  capsule by mouth daily.    pantoprazole (PROTONIX) 40 MG tablet Take 1 tablet (40 mg total) by mouth 2 (two) times daily before a meal.   ranolazine (RANEXA) 500 MG 12 hr tablet Take 1 tablet by mouth 2 (two) times daily.   rosuvastatin (CRESTOR) 40 MG tablet Take 40 mg by mouth every evening.    sacubitril-valsartan (ENTRESTO) 24-26 MG Take 1 tablet by mouth 2 (two) times daily.   sodium bicarbonate 650 MG tablet Take 650 mg by mouth 2 (two) times daily.   Torsemide 40 MG TABS Take 40 mg by mouth once daily   Vitamin D, Ergocalciferol, 50000 units CAPS Take 1 capsule by mouth every 30 (thirty) days.   [DISCONTINUED] isosorbide mononitrate (IMDUR) 60 MG 24 hr tablet Take 120 mg by mouth daily.  (Patient not taking: Reported on 09/15/2021)   [DISCONTINUED] metoprolol tartrate (LOPRESSOR) 50 MG tablet Take 50 mg by mouth 3 (three) times daily.  (Patient not taking: Reported on 09/15/2021)   [DISCONTINUED] nystatin (MYCOSTATIN) 100000 UNIT/ML suspension Take 5 mLs (500,000 Units total) by mouth 4 (four) times daily. Swish and swallow 3 times daily for 10 days (Patient not taking: Reported on 09/15/2021)   [DISCONTINUED] vitamin C (ASCORBIC ACID) 250 MG tablet Take 250 mg by mouth daily. (Patient not taking: Reported on 09/15/2021)   [DISCONTINUED] Vitamin D, Ergocalciferol, (DRISDOL) 50000 units CAPS capsule Take 50,000 Units by mouth every 30 (thirty) days.  (Patient not taking: Reported on 09/15/2021)   [DISCONTINUED] zinc gluconate 50 MG tablet Take 50 mg by mouth daily. (Patient not taking: Reported on 09/15/2021)   No facility-administered encounter medications on file as of 09/15/2021.    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, esphageal   Chronic diastolic CHF (congestive heart failure) (HCC)    Colon polyp    Diabetes mellitus without complication ([place])    Diverticulosis    Gout    Heart disease    Hemangioma    liver   Hyperlipidemia    Hypertension    Myocardial infarct [facility])    Ocular hypertension    Peripheral vascular disease ([place])    Skin cancer    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[place])    Sleep apnea    Vitreoretinal degeneration    Past Surgical History:  Procedure Laterality Date   CATARACT EXTRACTION  2011, 2012   COLONOSCOPY N/A 12/14/2018   Procedure: COLONOSCOPY;  Surgeon: Virgel Manifold, MD;  Location: [facility] ENDOSCOPY;  Service: Endoscopy;  Laterality: N/A;   CORONARY ANGIOPLASTY WITH STENT PLACEMENT  2012  CORONARY STENT INTERVENTION N/A 12/29/2019   Procedure: CORONARY STENT INTERVENTION;  Surgeon: Nelva Bush, MD;  Location: [place] CV LAB;  Service: Cardiovascular;  Laterality: N/A;   ESOPHAGOGASTRODUODENOSCOPY N/A 12/14/2018   Procedure: ESOPHAGOGASTRODUODENOSCOPY (EGD);  Surgeon: Virgel Manifold, MD;  Location: [facility] ENDOSCOPY;  Service: Endoscopy;  Laterality: N/A;   ESOPHAGOGASTRODUODENOSCOPY (EGD) WITH PROPOFOL N/A 06/01/2016   Procedure: ESOPHAGOGASTRODUODENOSCOPY (EGD) WITH PROPOFOL;  Surgeon: Manya Silvas, MD;  Location[facility] ENDOSCOPY;  Service: Endoscopy;  Laterality: N/A;   HERNIA REPAIR     x2   INTRAOCULAR LENS INSERTION     LEFT HEART CATH AND CORONARY ANGIOGRAPHY N/A 05/09/2017   Procedure: Left Heart Cath and Coronary Angiography;  Surgeon: Yolonda Kida, MD;  Location: [place] CV LAB;  Service: Cardiovascular;  Laterality: N/A;   LEFT HEART CATH AND CORONARY ANGIOGRAPHY N/A 12/08/2018   Procedure: LEFT HEART CATH AND CORONARY ANGIOGRAPHY and possible PCI and stent;  Surgeon:  Yolonda Kida, MD;  Location: [place] CV LAB;  Service: Cardiovascular;  Laterality: N/A;   LEFT HEART CATH AND CORONARY ANGIOGRAPHY N/A 12/29/2019   Procedure: LEFT HEART CATH AND CORONARY ANGIOGRAPHY;  Surgeon: Nelva Bush, MD;  Location: [place] CV LAB;  Service: Cardiovascular;  Laterality: N/A;   NOSE SURGERY     submucous resection   PROSTATE SURGERY  2002   ROTATOR CUFF REPAIR Right    Family History  Problem Relation Age of Onset   Arthritis Mother    Stroke Maternal Grandfather    Breast cancer Neg Hx    Social History   Socioeconomic History   Marital status: Married    Spouse name: Not on file   Number of children: 2   Years of education: College 3 years   Highest education level: Associate degree: occupational, Hotel manager, or vocational program  Occupational History   Occupation: retired  Tobacco Use   Smoking status: Former    Years: 20.00    Types: Cigarettes    Quit date: 12/10/1976    Years since quitting: 44.7   Smokeless tobacco: Never  Substance and Sexual Activity   Alcohol use: No   Drug use: No   Sexual activity: Not Currently  Other Topics Concern   Not on file  Social History Narrative   Not on file   Social Determinants of Health   Financial Resource Strain: Low Risk    Difficulty of Paying Living Expenses: Not very hard  Food Insecurity: No Food Insecurity   Worried About Charity fundraiser in the Last Year: Never true   [place] in the Last Year: Never true  Transportation Needs: No Transportation Needs   Lack of Transportation (Medical): No   Lack of Transportation (Non-Medical): No  Physical Activity: Insufficiently Active   Days of Exercise per Week: 7 days   Minutes of Exercise per Session: 20 min  Stress: No Stress Concern Present   Feeling of Stress : Not at all  Social Connections: Moderately Isolated   Frequency of Communication with Friends and Family: Once a week   Frequency of Social Gatherings  with Friends and Family: Once a week   Attends Religious Services: More than 4 times per year   Active Member of [employer] or Organizations: No   Attends Archivist Meetings: Never   Marital Status: Married    Tobacco Counseling Counseling given: Not Answered   Clinical Intake:  Pre-visit preparation completed: Yes  Pain : No/denies pain  Diabetes: Yes  How often do you need to have someone help you when you read instructions, pamphlets, or other written materials from your doctor or pharmacy?: 3 - Sometimes What is the last grade level you completed in school?: College 3 years  Diabetic? Yes  Interpreter Needed?: No  Information entered by :: Anson [place] CMA   Activities of Daily Living In your present state of health, do you have any difficulty performing the following activities: 09/15/2021  Hearing? Y  Comment pt wears hearing aids  Vision? N  Difficulty concentrating or making decisions? N  Walking or climbing stairs? N  Dressing or bathing? N  Doing errands, shopping? N  Preparing Food and eating ? N  Using the Toilet? N  In the past six months, have you accidently leaked urine? N  Do you have problems with loss of bowel control? N  Managing your Medications? N  Managing your Finances? N  Housekeeping or managing your Housekeeping? N  Some recent data might be hidden    Patient Care Team: Cletis Athens, MD as PCP - General (Internal Medicine) P.A., Cletis Athens, MD as Referring Physician (Cardiology) Christene Lye, MD (General Surgery) Alisa Graff, FNP as Nurse Practitioner (Family Medicine) Yolonda Kida, MD as Consulting Physician (Cardiology) Gabriel Carina Betsey Holiday, MD as Physician Assistant (Endocrinology) Lavonia Dana, MD as Consulting Physician (Nephrology) Allyne Gee, MD as Consulting Physician (Pulmonary Disease) [name], MD as Consulting Physician (Gastroenterology)  Indicate any recent Medical Services you  may have received from other than [facility] providers in the past year (date may be approximate).     Assessment:   This is a routine wellness examination for Mike Decker.  Hearing/Vision screen No results found.  Dietary issues and exercise activities discussed: Current Exercise Habits: Home exercise routine, Type of exercise: walking, Time (Minutes): 10, Frequency (Times/Week): 7, Weekly Exercise (Minutes/Week): 70, Intensity: Mild, Exercise limited by: orthopedic condition(s);respiratory conditions(s);cardiac condition(s) (Pt has leg pain)   Goals Addressed   None    Depression Screen PHQ 2/9 Scores 09/15/2021 06/28/2021 07/19/2020 02/05/2019 09/04/2018 02/27/2018  PHQ - 2 Score 0 0 0 2 0 0  PHQ- 9 Score - - - 6 - -    Fall Risk Fall Risk  09/15/2021 07/05/2021 06/28/2021 07/19/2020 03/03/2020  Falls in the past year? 0 0 0 0 0  Comment - - - - -  Number falls in past yr: 0 0 0 0 0  Injury with Fall? 0 0 0 - 0  Risk for fall due to : No Fall Risks No Fall Risks No Fall Risks - -  Follow up Falls evaluation completed Falls evaluation completed Falls evaluation completed - Falls evaluation completed    FALL RISK PREVENTION PERTAINING TO THE HOME:  Any stairs in or around the home? Yes  If so, are there any without handrails? No  Home free of loose throw rugs in walkways, pet beds, electrical cords, etc? Yes  Adequate lighting in your home to reduce risk of falls? Yes   ASSISTIVE DEVICES UTILIZED TO PREVENT FALLS:  Life alert? No  Use of a cane, walker or w/c? Yes  Grab bars in the bathroom? No  Shower chair or bench in shower? Yes  Elevated toilet seat or a handicapped toilet? No   TIMED UP AND GO:  Was the test performed? No .  Length of time to ambulate 10 feet: 0 sec.     Cognitive Function:     6CIT Screen 09/15/2021  What Year? 0 points  What month? 0 points  What time? 0 points  Count back from 20 0 points  Months in reverse 0 points  Repeat phrase 0 points  Total  Score 0    Immunizations Immunization History  Administered Date(s) Administered   Influenza-Unspecified 02/27/2018   PFIZER(Purple Top)SARS-COV-2 Vaccination 01/18/2020, 02/08/2020, 09/06/2020, 05/01/2021   Pneumococcal Conjugate-13 08/11/2015   Pneumococcal Polysaccharide-23 12/10/2014   Pneumococcal-Unspecified 07/30/2016   Td (Adult) 07/31/2016   Tdap 12/11/2003, 01/13/2005, 08/04/2020    TDAP status: Up to date  Flu Vaccine: Not up to date, pt declined  Pneumococcal vaccine status: Up to date  Covid-19 vaccine status: Information provided on how to obtain vaccines.   Qualifies for Shingles Vaccine? Yes   Zostavax completed No   Shingrix Completed?: No.    Education has been provided regarding the importance of this vaccine. Patient has been advised to call insurance company to determine out of pocket expense if they have not yet received this vaccine. Advised may also receive vaccine at local pharmacy or Health Dept. Verbalized acceptance and understanding.  Screening Tests Health Maintenance  Topic Date Due   FOOT EXAM  Never done   Zoster Vaccines- Shingrix (1 of 2) Never done   COVID-19 Vaccine (5 - Booster for Pfizer series) 09/01/2021   INFLUENZA VACCINE  03/09/2022 (Originally 07/10/2021)   OPHTHALMOLOGY EXAM  10/31/2021   COLONOSCOPY (Pts 45-34yrs Insurance coverage will need to be confirmed)  12/14/2021   HEMOGLOBIN A1C  01/18/2022   TETANUS/TDAP  08/04/2030   HPV [place] Maintenance  Health Maintenance Due  Topic Date Due   FOOT EXAM  Never done   Zoster Vaccines- Shingrix (1 of 2) Never done   COVID-19 Vaccine (5 - Booster for Pfizer series) 09/01/2021    Colorectal cancer screening: No longer required.   Lung Cancer Screening: (Low Dose CT Chest recommended if Age 34-80 years, 30 pack-year currently smoking OR have quit w/in 15years.) does not qualify.   Lung Cancer Screening Referral: N/A  Additional Screening:  Hepatitis C  Screening: does not qualify; Completed No  Vision Screening: Recommended annual ophthalmology exams for early detection of glaucoma and other disorders of the eye. Is the patient up to date with their annual eye exam?  Yes  pt scheduled for 11/13/21. Who is the provider or what is the name of the office in which the patient attends annual eye exams? [facility] If pt is not established with a provider, would they like to be referred to a provider to establish care? No .   Dental Screening: Recommended annual dental exams for proper oral hygiene  Community Resource Referral / Chronic Care Management: CRR required this visit?  No   CCM required this visit?  No      Plan:     I have personally reviewed and noted the following in the patient's chart:   Medical and social history Use of alcohol, tobacco or illicit drugs  Current medications and supplements including opioid prescriptions. Patient is not currently taking opioid prescriptions. Functional ability and status Nutritional status Physical activity Advanced directives List of other physicians Hospitalizations, surgeries, and ER visits in previous 12 months Vitals Screenings to include cognitive, depression, and falls Referrals and appointments  In addition, I have reviewed and discussed with patient certain preventive protocols, quality metrics, and best practice recommendations. A written personalized care plan for preventive services as well as general preventive health recommendations were  provided to patient.     Renato Gails, [place]   09/15/2021   Nurse Notes: Patient is up to date on care gaps, patient was given information on how to obtain the needed vaccines. Patient had an allergic reaction to flu shot in the past and was told by Dr. Lavera Guise to no have it again. No refills needed today.   Mike Decker , Thank you for taking time to come for your Medicare Wellness Visit. I appreciate your ongoing commitment  to your health goals. Please review the following plan we discussed and let me know if I can assist you in the future.   These are the goals we discussed:  Goals   None     This is a list of the screening recommended for you and due dates:  Health Maintenance  Topic Date Due   Complete foot exam   Never done   Zoster (Shingles) Vaccine (1 of 2) Never done   COVID-19 Vaccine (5 - Booster for Pfizer series) 09/01/2021   Flu Shot  03/09/2022*   Eye exam for diabetics  10/31/2021   Colon Cancer Screening  12/14/2021   Hemoglobin A1C  01/18/2022   Tetanus Vaccine  08/04/2030   HPV Vaccine  Aged Out  *Topic was postponed. The date shown is not the original due date.

## 2021-09-16 NOTE — Progress Notes (Signed)
I have reviewed this visit and agree with the documentation.   

## 2021-10-03 ENCOUNTER — Encounter: Payer: Self-pay | Admitting: Internal Medicine

## 2021-10-03 ENCOUNTER — Ambulatory Visit (INDEPENDENT_AMBULATORY_CARE_PROVIDER_SITE_OTHER): Payer: Medicare Other | Admitting: Internal Medicine

## 2021-10-03 ENCOUNTER — Other Ambulatory Visit: Payer: Self-pay

## 2021-10-03 ENCOUNTER — Other Ambulatory Visit
Admission: RE | Admit: 2021-10-03 | Discharge: 2021-10-03 | Disposition: A | Discharge status: Home / Self Care | Payer: Medicare Other | Attending: Internal Medicine | Admitting: Internal Medicine

## 2021-10-03 VITALS — BP 111/55 | HR 72 | Ht 66.0 in | Wt 200.7 lb

## 2021-10-03 DIAGNOSIS — I2583 Coronary atherosclerosis due to lipid rich plaque: Secondary | ICD-10-CM | POA: Insufficient documentation

## 2021-10-03 DIAGNOSIS — I5042 Chronic combined systolic (congestive) and diastolic (congestive) heart failure: Secondary | ICD-10-CM | POA: Diagnosis not present

## 2021-10-03 DIAGNOSIS — R0602 Shortness of breath: Secondary | ICD-10-CM

## 2021-10-03 DIAGNOSIS — J449 Chronic obstructive pulmonary disease, unspecified: Secondary | ICD-10-CM

## 2021-10-03 DIAGNOSIS — I2510 Atherosclerotic heart disease of native coronary artery without angina pectoris: Secondary | ICD-10-CM

## 2021-10-03 DIAGNOSIS — I10 Essential (primary) hypertension: Secondary | ICD-10-CM | POA: Diagnosis not present

## 2021-10-03 DIAGNOSIS — I6523 Occlusion and stenosis of bilateral carotid arteries: Secondary | ICD-10-CM | POA: Diagnosis not present

## 2021-10-03 DIAGNOSIS — Z794 Long term (current) use of insulin: Secondary | ICD-10-CM

## 2021-10-03 DIAGNOSIS — Z8546 Personal history of malignant neoplasm of prostate: Secondary | ICD-10-CM

## 2021-10-03 DIAGNOSIS — K22711 Barrett's esophagus with high grade dysplasia: Secondary | ICD-10-CM

## 2021-10-03 DIAGNOSIS — E1165 Type 2 diabetes mellitus with hyperglycemia: Secondary | ICD-10-CM

## 2021-10-03 DIAGNOSIS — C61 Malignant neoplasm of prostate: Secondary | ICD-10-CM

## 2021-10-03 LAB — COMPREHENSIVE METABOLIC PANEL
ALT: 15 U/L (ref 0–44)
AST: 15 U/L (ref 15–41)
Albumin: 4.1 g/dL (ref 3.5–5.0)
Alkaline Phosphatase: 66 U/L (ref 38–126)
Anion gap: 10 (ref 5–15)
BUN: 66 mg/dL — ABNORMAL HIGH (ref 8–23)
CO2: 25 mmol/L (ref 22–32)
Calcium: 8.7 mg/dL — ABNORMAL LOW (ref 8.9–10.3)
Chloride: 103 mmol/L (ref 98–111)
Creatinine, Ser: 2.83 mg/dL — ABNORMAL HIGH (ref 0.61–1.24)
GFR, Estimated: 22 mL/min — ABNORMAL LOW (ref >60)
Glucose, Bld: 176 mg/dL — ABNORMAL HIGH (ref 70–99)
Potassium: 4.7 mmol/L (ref 3.5–5.1)
Sodium: 138 mmol/L (ref 135–145)
Total Bilirubin: 0.7 mg/dL (ref 0.3–1.2)
Total Protein: 7.1 g/dL (ref 6.5–8.1)

## 2021-10-03 LAB — BRAIN NATRIURETIC PEPTIDE: B Natriuretic Peptide: 98.8 pg/mL (ref 0.0–100.0)

## 2021-10-03 NOTE — Progress Notes (Signed)
Established Patient Office Visit  Subjective:  Patient ID: Mike Decker, male    DOB: [birth year]  Age: 80 y.o. 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, esphageal   Chronic diastolic CHF (congestive heart failure) ([place])    Colon polyp    Diabetes mellitus without complication ([place])    Diverticulosis    Gout    Heart disease    Hemangioma    liver   Hyperlipidemia    Hypertension    Myocardial infarct [facility])    Ocular hypertension    Peripheral vascular disease ([place])    Skin cancer    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[place])    Sleep apnea    Vitreoretinal degeneration     Past Surgical History:  Procedure Laterality Date   CATARACT EXTRACTION  2011, 2012   COLONOSCOPY N/A 12/14/2018   Procedure: COLONOSCOPY;  Surgeon: Virgel Manifold, MD;  Location: [facility] ENDOSCOPY;  Service: Endoscopy;  Laterality: N/A;   CORONARY ANGIOPLASTY WITH STENT PLACEMENT  2012   CORONARY STENT INTERVENTION N/A 12/29/2019   Procedure: CORONARY STENT INTERVENTION;  Surgeon: Nelva Bush, MD;  Location: [place] CV LAB;  Service: Cardiovascular;  Laterality: N/A;   ESOPHAGOGASTRODUODENOSCOPY N/A 12/14/2018   Procedure: ESOPHAGOGASTRODUODENOSCOPY (EGD);  Surgeon: Virgel Manifold, MD;  Location: [facility] ENDOSCOPY;  Service: Endoscopy;  Laterality: N/A;   ESOPHAGOGASTRODUODENOSCOPY (EGD) WITH PROPOFOL N/A 06/01/2016   Procedure: ESOPHAGOGASTRODUODENOSCOPY (EGD) WITH PROPOFOL;  Surgeon: Manya Silvas, MD;  Location: [facility] ENDOSCOPY;  Service: Endoscopy;  Laterality: N/A;   HERNIA REPAIR     x2   INTRAOCULAR LENS INSERTION     LEFT HEART CATH AND CORONARY ANGIOGRAPHY  N/A 05/09/2017   Procedure: Left Heart Cath and Coronary Angiography;  Surgeon: Yolonda Kida, MD;  Location: [place] [facility];  Service: Cardiovascular;  Laterality: N/A;   LEFT HEART CATH AND CORONARY ANGIOGRAPHY N/A 12/08/2018   Procedure: LEFT HEART CATH AND CORONARY ANGIOGRAPHY and possible PCI and stent;  Surgeon: Yolonda Kida, MD;  Location: [place] [facility];  Service: Cardiovascular;  Laterality: N/A;   LEFT HEART CATH AND CORONARY ANGIOGRAPHY N/A 12/29/2019   Procedure: LEFT HEART CATH AND CORONARY ANGIOGRAPHY;  Surgeon: Nelva Bush, MD;  Location: [place] [facility];  Service: Cardiovascular;  Laterality: N/A;   NOSE SURGERY     submucous resection   PROSTATE SURGERY  2002   ROTATOR CUFF REPAIR Right     Family History  Problem Relation Age of Onset   Arthritis Mother    Stroke Maternal Grandfather    Breast cancer Neg Hx     Social History   Socioeconomic History   Marital status: Married    Spouse name: Not on file   Number of children: 2   Years of education: College 3 years   Highest education level: Associate degree: occupational, Hotel manager, or vocational program  Occupational History   Occupation: retired  Tobacco Use   Smoking status: Former    Years: 20.00    Types: Cigarettes    Quit date: 12/10/1976    Years since quitting: 42.8  Smokeless tobacco: Never  Substance and Sexual Activity   Alcohol use: No   Drug use: No   Sexual activity: Not Currently  Other Topics Concern   Not on file  Social History Narrative   Not on file   Social Determinants of Health   Financial Resource Strain: Low Risk    Difficulty of Paying Living Expenses: Not very hard  Food Insecurity: No Food Insecurity   Worried About Running Out of Food in the Last Year: Never true   Ran Out of Food in the Last Year: Never true  Transportation Needs: No Transportation Needs   Lack of Transportation (Medical): No   Lack of Transportation (Non-Medical): No   Physical Activity: Insufficiently Active   Days of Exercise per Week: 7 days   Minutes of Exercise per Session: 20 min  Stress: No Stress Concern Present   Feeling of Stress : Not at all  Social Connections: Moderately Isolated   Frequency of Communication with Friends and Family: Once a week   Frequency of Social Gatherings with Friends and Family: Once a week   Attends Religious Services: More than 4 times per year   Active Member of [employer] or Organizations: No   Attends Archivist Meetings: Never   Marital Status: Married  Human resources officer Violence: Not At Risk   Fear of Current or Ex-Partner: No   Emotionally Abused: No   Physically Abused: No   Sexually Abused: No     Current Outpatient Medications:    allopurinol (ZYLOPRIM) 100 MG tablet, Take 100 mg by mouth daily. , Disp: , Rfl:    azelastine (ASTELIN) 0.1 % nasal spray, USE 1 TO 2 SPRAYS IN EACH NOSTRIL TWICE A DAY, Disp: , Rfl:    cetirizine (ZYRTEC) 5 MG tablet, Take 5 mg by mouth daily as needed for allergies or rhinitis. , Disp: , Rfl:    citalopram (CELEXA) 10 MG tablet, TAKE 1 TABLET EVERY DAY, Disp: 90 tablet, Rfl: 3   clopidogrel (PLAVIX) 75 MG tablet, Take 1 tablet by mouth daily., Disp: , Rfl:    colchicine 0.6 MG tablet, Take 3 tablets (1.8 mg total) by mouth daily as needed (gout flares)., Disp: , Rfl:    [employer] VITAMIN B12 1000 MCG tablet, TAKE 1 TABLET BY MOUTH EVERY DAY, Disp: 90 tablet, Rfl: 1   dextromethorphan-guaiFENesin (MUCINEX DM) 30-600 MG 12hr tablet, Take 1 tablet by mouth 2 (two) times daily as needed for cough., Disp: , Rfl:    EPINEPHrine (EPI-PEN) 0.3 mg/0.3 mL SOAJ injection, Inject into the muscle as needed (anaphylaxis). , Disp: , Rfl:    ferrous sulfate 325 (65 FE) MG EC tablet, TAKE 1 TABLET BY MOUTH EVERY DAY, Disp: 90 tablet, Rfl: 3   fluticasone (FLONASE) 50 MCG/ACT nasal spray, USE 1 SPRAY IN EACH NOSTRIL EVERY DAY, Disp: 32 g, Rfl: 6   Fluticasone-Umeclidin-Vilant (TRELEGY  ELLIPTA) 100-62.5-25 MCG/INH AEPB, Inhale 1 puff into the lungs daily., Disp: 3 each, Rfl: 4   folic acid (FOLVITE) 161 MCG tablet, TAKE 1 TABLET (400 MCG TOTAL) BY MOUTH DAILY., Disp: 100 tablet, Rfl: 3   hydrALAZINE (APRESOLINE) 50 MG tablet, TAKE ONE TABLET BY MOUTH TWICE A DAY HOLD DOSE FOR SYSTOLIC BLOOD PRESSURE  LESS THAN 100, Disp: , Rfl:    insulin aspart (NOVOLOG) 100 UNIT/ML FlexPen, 15 UNITS IN AM, 25 UNITS AT LUNCH, 25 UNITS AT DINNER, Disp: , Rfl:    insulin glargine (LANTUS) 100 UNIT/ML injection, Inject 0.35 mLs (35 Units total)  into the skin at bedtime. This is a decrease from your previous 45 units nightly. (Patient taking differently: Inject 60 Units into the skin at bedtime. This is a decrease from your previous 45 units nightly.), Disp: 10 mL, Rfl: 11   ipratropium-albuterol (DUONEB) 0.5-2.5 (3) MG/3ML SOLN, Take 3 mLs by nebulization every 4 (four) hours as needed., Disp: 360 mL, Rfl: 0   isosorbide mononitrate (IMDUR) 120 MG 24 hr tablet, , Disp: , Rfl:    magnesium oxide (MAG-OX) 400 (240 Mg) MG tablet, Take by mouth., Disp: , Rfl:    methocarbamol (ROBAXIN) 500 MG tablet, Take 500 mg by mouth 2 (two) times daily. , Disp: , Rfl: 0   metoprolol tartrate (LOPRESSOR) 50 MG tablet, Take 50 mg by mouth 2 (two) times daily., Disp: , Rfl:    montelukast (SINGULAIR) 10 MG tablet, TAKE 1 TABLET BY MOUTH EVERYDAY AT BEDTIME, Disp: 90 tablet, Rfl: 1   nitroGLYCERIN (NITROSTAT) 0.4 MG SL tablet, Place 0.4 mg under the tongue every 5 (five) minutes x 3 doses as needed for chest pain. , Disp: , Rfl:    Omega-3 Fatty Acids (FISH OIL) 1000 MG CAPS, Take 1 capsule by mouth daily. , Disp: , Rfl:    pantoprazole (PROTONIX) 40 MG tablet, Take 1 tablet (40 mg total) by mouth 2 (two) times daily before a meal., Disp: , Rfl:    ranolazine (RANEXA) 500 MG 12 hr tablet, Take 1 tablet by mouth 2 (two) times daily., Disp: , Rfl:    rosuvastatin (CRESTOR) 40 MG tablet, Take 40 mg by mouth every evening. ,  Disp: , Rfl:    sacubitril-valsartan (ENTRESTO) 24-26 MG, Take 1 tablet by mouth 2 (two) times daily., Disp: 180 tablet, Rfl: 3   sodium bicarbonate 650 MG tablet, Take 650 mg by mouth 2 (two) times daily., Disp: , Rfl:    Torsemide 40 MG TABS, Take 40 mg by mouth once daily, Disp: , Rfl:    Vitamin D, Ergocalciferol, 50000 units CAPS, Take 1 capsule by mouth every 30 (thirty) days., Disp: , Rfl:    Allergies  Allergen Reactions   Gabapentin     Other reaction(s): Other (See Comments) Tremors   Peanut-Containing Drug Products Anaphylaxis   Penicillins Hives and Rash    Has patient had a PCN reaction causing immediate rash, facial/tongue/throat swelling, SOB or lightheadedness with hypotension: Yes Has patient had a PCN reaction causing severe rash involving mucus membranes or skin necrosis: No Has patient had a PCN reaction that required hospitalization No Has patient had a PCN reaction occurring within the last 10 years: No If all of the above answers are "NO", then may proceed with Cephalosporin use.   Bee Venom Swelling   Influenza Vaccines Hives   Inh [Isoniazid] Hives   Jardiance [Empagliflozin] Other (See Comments)    Diarrhea   Kenalog [Triamcinolone Acetonide] Hives   Levaquin [Levofloxacin] Other (See Comments)    Tendon, ligament pain.    Lisinopril     Other reaction(s): Hyperkalemia   Nalfon [Fenoprofen Calcium] Hives   Naproxen    Peanut Oil    Nsaids Rash    Nalfon 600 Nalfon 600    ROS Review of Systems  Constitutional: Negative.   HENT: Negative.    Eyes: Negative.   Respiratory: Negative.    Cardiovascular: Negative.   Gastrointestinal: Negative.   Endocrine: Negative.   Genitourinary: Negative.   Musculoskeletal: Negative.   Skin: Negative.   Allergic/Immunologic: Negative.   Neurological: Negative.  Hematological: Negative.   Psychiatric/Behavioral: Negative.    All other systems reviewed and are negative.    Objective:    Physical  Exam Vitals reviewed.  Constitutional:      Appearance: Normal appearance.  HENT:     Mouth/Throat:     Mouth: Mucous membranes are moist.  Eyes:     Pupils: Pupils are equal, round, and reactive to light.  Neck:     Vascular: No carotid bruit.  Cardiovascular:     Rate and Rhythm: Normal rate and regular rhythm.     Pulses: Normal pulses.     Heart sounds: Normal heart sounds.  Pulmonary:     Effort: Pulmonary effort is normal.     Breath sounds: Normal breath sounds.  Abdominal:     General: Bowel sounds are normal.     Palpations: Abdomen is soft. There is no hepatomegaly, splenomegaly or mass.     Tenderness: There is no abdominal tenderness.     Hernia: No hernia is present.  Musculoskeletal:     Cervical back: Neck supple.     Right lower leg: No edema.     Left lower leg: No edema.  Skin:    Findings: No rash.  Neurological:     Mental Status: He is alert and oriented to person, place, and time.     Motor: No weakness.  Psychiatric:        Mood and Affect: Mood normal.        Behavior: Behavior normal.    BP (!) 111/55   Pulse 72   Ht '5\' 6"'  (1.676 m)   Wt 200 lb 11.2 oz (91 kg)   BMI 32.39 kg/m  Wt Readings from Last 3 Encounters:  10/03/21 200 lb 11.2 oz (91 kg)  08/17/21 205 lb (93 kg)  07/05/21 200 lb 3.2 oz (90.8 kg)     Health Maintenance Due  Topic Date Due   FOOT EXAM  Never done   Zoster Vaccines- Shingrix (1 of 2) Never done   COVID-19 Vaccine (5 - Booster for Pfizer series) 06/26/2021    There are no preventive care reminders to display for this patient.  Lab Results  Component Value Date   TSH 1.948 12/06/2018   Lab Results  Component Value Date   WBC 9.9 06/28/2021   HGB 14.1 06/28/2021   HCT 44.1 06/28/2021   MCV 97.6 06/28/2021   PLT 246 06/28/2021   Lab Results  Component Value Date   NA 135 06/28/2021   K 4.4 06/28/2021   CO2 27 06/28/2021   GLUCOSE 233 (H) 06/28/2021   BUN 52 (H) 06/28/2021   CREATININE 2.35 (H)  06/28/2021   BILITOT 0.4 06/28/2021   ALKPHOS 65 04/18/2020   AST 11 06/28/2021   ALT 9 06/28/2021   PROT 7.1 06/28/2021   ALBUMIN 4.3 04/18/2020   CALCIUM 9.4 06/28/2021   ANIONGAP 11 07/07/2020   EGFR 27 (L) 06/28/2021   Lab Results  Component Value Date   CHOL 115 12/29/2019   Lab Results  Component Value Date   HDL 31 (L) 12/29/2019   Lab Results  Component Value Date   LDLCALC 51 12/29/2019   Lab Results  Component Value Date   TRIG 163 (H) 12/29/2019   Lab Results  Component Value Date   CHOLHDL 3.7 12/29/2019   Lab Results  Component Value Date   HGBA1C 8.3 07/18/2021      Assessment & Plan:   Problem List Items Addressed This  Visit       Cardiovascular and Mediastinum   Essential hypertension     Patient denies any chest pain or shortness of breath there is no history of palpitation or paroxysmal nocturnal dyspnea   patient was advised to follow low-salt low-cholesterol diet    ideally I want to keep systolic blood pressure below 130 mmHg, patient was asked to check blood pressure one times a week and give me a report on that.  Patient will be follow-up in 3 months  or earlier as needed, patient will call me back for any change in the cardiovascular symptoms Patient was advised to buy a book from local bookstore concerning blood pressure and read several chapters  every day.  This will be supplemented by some of the material we will give him from the office.  Patient should also utilize other resources like YouTube and Internet to learn more about the blood pressure and the diet.      CAD (coronary artery disease)    Denies chest pain      Relevant Orders   COMPLETE METABOLIC PANEL WITH GFR   Heart failure (HCC) - Primary    Congestive heart failure is stable we will check BMP and electrolyte        Respiratory   Chronic obstructive pulmonary disease (HCC)    Stable at the present time        Digestive   Barrett's esophagus     Endocrine    Uncontrolled type 2 diabetes mellitus with hyperglycemia, with long-term current use of insulin ([place])    - The patient's blood sugar is labile on med. - The patient will continue the current treatment regimen.  - I encouraged the patient to regularly check blood sugar.  - I encouraged the patient to monitor diet. I encouraged the patient to eat low-carb and low-sugar to help prevent blood sugar spikes.  - I encouraged the patient to continue following their prescribed treatment plan for diabetes - I informed the patient to get help if blood sugar drops below 43m/dL, or if suddenly have trouble thinking clearly or breathing.  Patient was advised to buy a book on diabetes from a local bookstore or from [place]  Patient should read 2 chapters every day to keep the motivation going, this is in addition to some of the materials we provided them from the office.  There are other resources on the Internet like YouTube and [employer] to get an education on the diabetes        Genitourinary   Prostate cancer ([place]    Stable at the present time.        Other   SOB (shortness of breath)    Patient does not have any wheezing no pedal edema      Relevant Orders   Brain natriuretic peptide    No orders of the defined types were placed in this encounter.   Follow-up: No follow-ups on file.    JCletis Athens MD

## 2021-10-03 NOTE — Assessment & Plan Note (Signed)
Stable at the present time. 

## 2021-10-03 NOTE — Assessment & Plan Note (Signed)
Patient does not have any wheezing no pedal edema

## 2021-10-03 NOTE — Assessment & Plan Note (Signed)
Congestive heart failure is stable we will check BMP and electrolyte

## 2021-10-03 NOTE — Assessment & Plan Note (Signed)
Denies chest pain 

## 2021-10-03 NOTE — Assessment & Plan Note (Signed)

## 2021-10-03 NOTE — Assessment & Plan Note (Signed)
Patient denies any chest pain or shortness of breath there is no history of palpitation or paroxysmal nocturnal dyspnea °  patient was advised to follow low-salt low-cholesterol diet ° °  ideally I want to keep systolic blood pressure below 130 mmHg, patient was asked to check blood pressure one times a week and give me a report on that.  Patient will be follow-up in 3 months  or earlier as needed, patient will call me back for any change in the cardiovascular symptoms °Patient was advised to buy a book from local bookstore concerning blood pressure and read several chapters  every day.  This will be supplemented by some of the material we will give him from the office.  Patient should also utilize other resources like YouTube and Internet to learn more about the blood pressure and the diet. °

## 2021-10-12 DIAGNOSIS — D225 Melanocytic nevi of trunk: Secondary | ICD-10-CM | POA: Diagnosis not present

## 2021-10-12 DIAGNOSIS — D485 Neoplasm of uncertain behavior of skin: Secondary | ICD-10-CM | POA: Diagnosis not present

## 2021-10-12 DIAGNOSIS — D2272 Melanocytic nevi of left lower limb, including hip: Secondary | ICD-10-CM | POA: Diagnosis not present

## 2021-10-12 DIAGNOSIS — D2261 Melanocytic nevi of right upper limb, including shoulder: Secondary | ICD-10-CM | POA: Diagnosis not present

## 2021-10-12 DIAGNOSIS — D0439 Carcinoma in situ of skin of other parts of face: Secondary | ICD-10-CM | POA: Diagnosis not present

## 2021-10-12 DIAGNOSIS — Z85820 Personal history of malignant melanoma of skin: Secondary | ICD-10-CM | POA: Diagnosis not present

## 2021-10-12 DIAGNOSIS — Z85828 Personal history of other malignant neoplasm of skin: Secondary | ICD-10-CM | POA: Diagnosis not present

## 2021-10-12 DIAGNOSIS — L570 Actinic keratosis: Secondary | ICD-10-CM | POA: Diagnosis not present

## 2021-10-20 DIAGNOSIS — R9431 Abnormal electrocardiogram [ECG] [EKG]: Secondary | ICD-10-CM | POA: Diagnosis not present

## 2021-10-20 DIAGNOSIS — R079 Chest pain, unspecified: Secondary | ICD-10-CM | POA: Diagnosis not present

## 2021-10-20 DIAGNOSIS — R110 Nausea: Secondary | ICD-10-CM | POA: Diagnosis not present

## 2021-10-20 DIAGNOSIS — E785 Hyperlipidemia, unspecified: Secondary | ICD-10-CM | POA: Diagnosis not present

## 2021-10-20 DIAGNOSIS — I495 Sick sinus syndrome: Secondary | ICD-10-CM | POA: Diagnosis not present

## 2021-10-20 DIAGNOSIS — N189 Chronic kidney disease, unspecified: Secondary | ICD-10-CM | POA: Diagnosis not present

## 2021-10-20 DIAGNOSIS — E669 Obesity, unspecified: Secondary | ICD-10-CM | POA: Diagnosis not present

## 2021-10-20 DIAGNOSIS — Z955 Presence of coronary angioplasty implant and graft: Secondary | ICD-10-CM | POA: Diagnosis not present

## 2021-10-20 DIAGNOSIS — D649 Anemia, unspecified: Secondary | ICD-10-CM | POA: Diagnosis not present

## 2021-10-20 DIAGNOSIS — I517 Cardiomegaly: Secondary | ICD-10-CM | POA: Diagnosis not present

## 2021-10-20 DIAGNOSIS — I2510 Atherosclerotic heart disease of native coronary artery without angina pectoris: Secondary | ICD-10-CM | POA: Diagnosis not present

## 2021-10-20 DIAGNOSIS — R0789 Other chest pain: Secondary | ICD-10-CM | POA: Diagnosis not present

## 2021-10-20 DIAGNOSIS — E1122 Type 2 diabetes mellitus with diabetic chronic kidney disease: Secondary | ICD-10-CM | POA: Diagnosis not present

## 2021-10-20 DIAGNOSIS — R918 Other nonspecific abnormal finding of lung field: Secondary | ICD-10-CM | POA: Diagnosis not present

## 2021-10-20 DIAGNOSIS — I130 Hypertensive heart and chronic kidney disease with heart failure and stage 1 through stage 4 chronic kidney disease, or unspecified chronic kidney disease: Secondary | ICD-10-CM | POA: Diagnosis not present

## 2021-10-20 DIAGNOSIS — R0602 Shortness of breath: Secondary | ICD-10-CM | POA: Diagnosis not present

## 2021-10-25 DIAGNOSIS — Z955 Presence of coronary angioplasty implant and graft: Secondary | ICD-10-CM | POA: Diagnosis not present

## 2021-10-25 DIAGNOSIS — G4733 Obstructive sleep apnea (adult) (pediatric): Secondary | ICD-10-CM | POA: Diagnosis not present

## 2021-10-25 DIAGNOSIS — I739 Peripheral vascular disease, unspecified: Secondary | ICD-10-CM | POA: Diagnosis not present

## 2021-10-25 DIAGNOSIS — N184 Chronic kidney disease, stage 4 (severe): Secondary | ICD-10-CM | POA: Diagnosis not present

## 2021-10-25 DIAGNOSIS — I25118 Atherosclerotic heart disease of native coronary artery with other forms of angina pectoris: Secondary | ICD-10-CM | POA: Diagnosis not present

## 2021-10-25 DIAGNOSIS — I200 Unstable angina: Secondary | ICD-10-CM | POA: Diagnosis not present

## 2021-10-25 DIAGNOSIS — E669 Obesity, unspecified: Secondary | ICD-10-CM | POA: Diagnosis not present

## 2021-10-25 DIAGNOSIS — R002 Palpitations: Secondary | ICD-10-CM | POA: Diagnosis not present

## 2021-10-25 DIAGNOSIS — I5032 Chronic diastolic (congestive) heart failure: Secondary | ICD-10-CM | POA: Diagnosis not present

## 2021-10-25 DIAGNOSIS — E1142 Type 2 diabetes mellitus with diabetic polyneuropathy: Secondary | ICD-10-CM | POA: Diagnosis not present

## 2021-10-25 DIAGNOSIS — R0602 Shortness of breath: Secondary | ICD-10-CM | POA: Diagnosis not present

## 2021-10-25 DIAGNOSIS — I10 Essential (primary) hypertension: Secondary | ICD-10-CM | POA: Diagnosis not present

## 2021-10-25 DIAGNOSIS — E119 Type 2 diabetes mellitus without complications: Secondary | ICD-10-CM | POA: Diagnosis not present

## 2021-11-01 DIAGNOSIS — E1165 Type 2 diabetes mellitus with hyperglycemia: Secondary | ICD-10-CM | POA: Diagnosis not present

## 2021-11-01 DIAGNOSIS — Z794 Long term (current) use of insulin: Secondary | ICD-10-CM | POA: Diagnosis not present

## 2021-11-01 DIAGNOSIS — N1832 Chronic kidney disease, stage 3b: Secondary | ICD-10-CM | POA: Diagnosis not present

## 2021-11-01 DIAGNOSIS — R809 Proteinuria, unspecified: Secondary | ICD-10-CM | POA: Diagnosis not present

## 2021-11-01 DIAGNOSIS — E1122 Type 2 diabetes mellitus with diabetic chronic kidney disease: Secondary | ICD-10-CM | POA: Diagnosis not present

## 2021-11-01 DIAGNOSIS — E1142 Type 2 diabetes mellitus with diabetic polyneuropathy: Secondary | ICD-10-CM | POA: Diagnosis not present

## 2021-11-01 DIAGNOSIS — E1129 Type 2 diabetes mellitus with other diabetic kidney complication: Secondary | ICD-10-CM | POA: Diagnosis not present

## 2021-11-01 DIAGNOSIS — E1159 Type 2 diabetes mellitus with other circulatory complications: Secondary | ICD-10-CM | POA: Diagnosis not present

## 2021-11-09 ENCOUNTER — Telehealth: Payer: Self-pay

## 2021-11-09 ENCOUNTER — Telehealth (INDEPENDENT_AMBULATORY_CARE_PROVIDER_SITE_OTHER): Payer: Medicare Other | Admitting: Physician Assistant

## 2021-11-09 ENCOUNTER — Encounter: Payer: Self-pay | Admitting: Physician Assistant

## 2021-11-09 ENCOUNTER — Other Ambulatory Visit: Payer: Self-pay

## 2021-11-09 VITALS — Temp 100.0°F | Ht 66.0 in | Wt 192.0 lb

## 2021-11-09 DIAGNOSIS — U071 COVID-19: Secondary | ICD-10-CM

## 2021-11-09 DIAGNOSIS — J4520 Mild intermittent asthma, uncomplicated: Secondary | ICD-10-CM

## 2021-11-09 MED ORDER — AZITHROMYCIN 250 MG PO TABS: ORAL_TABLET | ORAL | 0 refills | Status: DC

## 2021-11-09 MED ORDER — PREDNISONE 10 MG PO TABS: ORAL_TABLET | ORAL | 0 refills | Status: DC

## 2021-11-09 NOTE — Progress Notes (Signed)
[facility] [place], [place] [address]  Internal MEDICINE  Telephone Visit  Patient Name: Mike Decker  [id number]  [id number]  Date of Service: 11/17/2021  I connected with the patient at 1:34 by telephone and verified the patients identity using two identifiers.   I discussed the limitations, risks, security and privacy concerns of performing an evaluation and management service by telephone and the availability of in person appointments. I also discussed with the patient that there may be a patient responsible charge related to the service.  The patient expressed understanding and agrees to proceed.    Chief Complaint  Patient presents with   Telephone Assessment   Telephone Screen    Covid positive at home test    Sinusitis   Nausea   Headache   Fatigue    HPI Pt is here for a virtual sick viist -Started feeling sick yesterday morning. Fever of 100, achey, dizzy, nausea, dry cough with occasional sputum. Took a covid test this morning which was positive. He has taken meclizine for nausea and tylenol -He does report azithromycin does not usually work well for him, however discussed that it has been beneficial to helping reduce symptoms associated with covid in addition to prednisone. He reports he cannot tolerate high doses of prednisone as this gives him heart palpitations and states he can tolerate 20mg  BID normally, therefore discussed standard dose pack is only 10mg  tabs TID at most which he states should be fine for him. -He did have 3 covid shots total (1 booster)   Current Medication: Outpatient Encounter Medications as of 11/09/2021  Medication Sig   allopurinol (ZYLOPRIM) 100 MG tablet Take 100 mg by mouth daily.    azelastine (ASTELIN) 0.1 % nasal spray USE 1 TO 2 SPRAYS IN EACH NOSTRIL TWICE A DAY   azithromycin (ZITHROMAX) 250 MG tablet Take one tab a day for 10 days for uri   cetirizine (ZYRTEC) 5 MG tablet Take 5 mg by mouth daily  as needed for allergies or rhinitis.    citalopram (CELEXA) 10 MG tablet TAKE 1 TABLET EVERY DAY   clopidogrel (PLAVIX) 75 MG tablet Take 1 tablet by mouth daily.   colchicine 0.6 MG tablet Take 3 tablets (1.8 mg total) by mouth daily as needed (gout flares).   [employer] VITAMIN B12 1000 MCG tablet TAKE 1 TABLET BY MOUTH EVERY DAY   dextromethorphan-guaiFENesin (MUCINEX DM) 30-600 MG 12hr tablet Take 1 tablet by mouth 2 (two) times daily as needed for cough.   EPINEPHrine (EPI-PEN) 0.3 mg/0.3 mL SOAJ injection Inject into the muscle as needed (anaphylaxis).    ferrous sulfate 325 (65 FE) MG EC tablet TAKE 1 TABLET BY MOUTH EVERY DAY   fluticasone (FLONASE) 50 MCG/ACT nasal spray USE 1 SPRAY IN EACH NOSTRIL EVERY DAY   Fluticasone-Umeclidin-Vilant (TRELEGY ELLIPTA) 100-62.5-25 MCG/INH AEPB Inhale 1 puff into the lungs daily.   folic acid (FOLVITE) 629 MCG tablet TAKE 1 TABLET (400 MCG TOTAL) BY MOUTH DAILY.   hydrALAZINE (APRESOLINE) 50 MG tablet TAKE ONE TABLET BY MOUTH TWICE A DAY HOLD DOSE FOR SYSTOLIC BLOOD PRESSURE  LESS THAN 100   insulin aspart (NOVOLOG) 100 UNIT/ML FlexPen 15 UNITS IN AM, 25 UNITS AT LUNCH, 25 UNITS AT DINNER   insulin glargine (LANTUS) 100 UNIT/ML injection Inject 0.35 mLs (35 Units total) into the skin at bedtime. This is a decrease from your previous 45 units nightly. (Patient taking differently: Inject 60 Units into the skin at bedtime. This  is a decrease from your previous 45 units nightly.)   ipratropium-albuterol (DUONEB) 0.5-2.5 (3) MG/3ML SOLN Take 3 mLs by nebulization every 4 (four) hours as needed.   isosorbide mononitrate (IMDUR) 120 MG 24 hr tablet    magnesium oxide (MAG-OX) 400 (240 Mg) MG tablet Take by mouth.   methocarbamol (ROBAXIN) 500 MG tablet Take 500 mg by mouth 2 (two) times daily.    metoprolol tartrate (LOPRESSOR) 50 MG tablet Take 50 mg by mouth 2 (two) times daily.   montelukast (SINGULAIR) 10 MG tablet TAKE 1 TABLET BY MOUTH EVERYDAY AT BEDTIME    nitroGLYCERIN (NITROSTAT) 0.4 MG SL tablet Place 0.4 mg under the tongue every 5 (five) minutes x 3 doses as needed for chest pain.    Omega-3 Fatty Acids (FISH OIL) 1000 MG CAPS Take 1 capsule by mouth daily.    pantoprazole (PROTONIX) 40 MG tablet Take 1 tablet (40 mg total) by mouth 2 (two) times daily before a meal.   predniSONE (DELTASONE) 10 MG tablet Take one tab 3 x day for 3 days, then take one tab 2 x a day for 3 days and then take one tab a day for 3 days for copd   ranolazine (RANEXA) 500 MG 12 hr tablet Take 1 tablet by mouth 2 (two) times daily.   rosuvastatin (CRESTOR) 40 MG tablet Take 40 mg by mouth every evening.    sacubitril-valsartan (ENTRESTO) 24-26 MG Take 1 tablet by mouth 2 (two) times daily.   sodium bicarbonate 650 MG tablet Take 650 mg by mouth 2 (two) times daily.   Torsemide 40 MG TABS Take 40 mg by mouth once daily   Vitamin D, Ergocalciferol, 50000 units CAPS Take 1 capsule by mouth every 30 (thirty) days.   No facility-administered encounter medications on file as of 11/09/2021.    Surgical History: Past Surgical History:  Procedure Laterality Date   CATARACT EXTRACTION  2011, 2012   COLONOSCOPY N/A 12/14/2018   Procedure: COLONOSCOPY;  Surgeon: Virgel Manifold, MD;  Location: [facility] ENDOSCOPY;  Service: Endoscopy;  Laterality: N/A;   CORONARY ANGIOPLASTY WITH STENT PLACEMENT  2012   CORONARY STENT INTERVENTION N/A 12/29/2019   Procedure: CORONARY STENT INTERVENTION;  Surgeon: Nelva Bush, MD;  Location: [facility];  Service: Cardiovascular;  Laterality: N/A;   ESOPHAGOGASTRODUODENOSCOPY N/A 12/14/2018   Procedure: ESOPHAGOGASTRODUODENOSCOPY (EGD);  Surgeon: Virgel Manifold, MD;  Location: [facility] ENDOSCOPY;  Service: Endoscopy;  Laterality: N/A;   ESOPHAGOGASTRODUODENOSCOPY (EGD) WITH PROPOFOL N/A 06/01/2016   Procedure: ESOPHAGOGASTRODUODENOSCOPY (EGD) WITH PROPOFOL;  Surgeon: Manya Silvas, MD;  Location: [facility] END[facility]COPY;  Service:  Endoscopy;  Laterality: N/A;   HERNIA REPAIR     x2   INTRAOCULAR LENS INSERTION     LEFT HEART CATH AND CORONARY ANGIOGRAPHY N/A 05/09/2017   Procedure: Left Heart Cath and Coronary Angiography;  Surgeon: Yolonda Kida, MD;  Location: [place] [facility];  Service: Cardiovascular;  Laterality: N/A;   LEFT HEART CATH AND CORONARY ANGIOGRAPHY N/A 12/08/2018   Procedure: LEFT HEART CATH AND CORONARY ANGIOGRAPHY and possible PCI and stent;  Surgeon: Yolonda Kida, MD;  Location: [facility];  Service: Cardiovascular;  Laterality: N/A;   LEFT HEART CATH AND CORONARY ANGIOGRAPHY N/A 12/29/2019   Procedure: LEFT HEART CATH AND CORONARY ANGIOGRAPHY;  Surgeon: Nelva Bush, MD;  Location: [place] [facility];  Service: Cardiovascular;  Laterality: N/A;   NOSE SURGERY     submucous resection   PROSTATE SURGERY  2002  ROTATOR CUFF REPAIR Right     Medical History: Past Medical History:  Diagnosis Date   Anemia    Arthritis    Atrioventricular canal (AVC)    irregular heart beats   Barrett esophagus    Bronchiolitis    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, esphageal   Chronic diastolic CHF (congestive heart failure) (HCC)    Colon polyp    Diabetes mellitus without complication (HCC)    Diverticulosis    Gout    Heart disease    Hemangioma    liver   Hyperlipidemia    Hypertension    Myocardial infarct ([place])    Ocular hypertension    Peripheral vascular disease ([place])    Skin cancer    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[place])    Sleep apnea    Vitreoretinal degeneration     Family History: Family History  Problem Relation Age of Onset   Arthritis Mother    Stroke Maternal Grandfather    Breast cancer Neg Hx     Social History   Socioeconomic History   Marital status: Married    Spouse name: Not on file   Number of children: 2   Years of education: College 3 years   Highest education level: Associate degree: occupational, Hotel manager, or vocational program  Occupational  History   Occupation: retired  Tobacco Use   Smoking status: Former    Years: 20.00    Types: Cigarettes    Quit date: 12/10/1976    Years since quitting: 44.9   Smokeless tobacco: Never  Substance and Sexual Activity   Alcohol use: No   Drug use: No   Sexual activity: Not Currently  Other Topics Concern   Not on file  Social History Narrative   Not on file   Social Determinants of Health   Financial Resource Strain: Low Risk    Difficulty of Paying Living Expenses: Not very hard  Food Insecurity: No Food Insecurity   Worried About Charity fundraiser in the Last Year: Never true   Ran Out of Food in the Last Year: Never true  Transportation Needs: No Transportation Needs   Lack of Transportation (Medical): No   Lack of Transportation (Non-Medical): No  Physical Activity: Insufficiently Active   Days of Exercise per Week: 7 days   Minutes of Exercise per Session: 20 min  Stress: No Stress Concern Present   Feeling of Stress : Not at all  Social Connections: Moderately Isolated   Frequency of Communication with Friends and Family: Once a week   Frequency of Social Gatherings with Friends and Family: Once a week   Attends Religious Services: More than 4 times per year   Active Member of [employer] or Organizations: No   Attends Archivist Meetings: Never   Marital Status: Married  Human resources officer Violence: Not At Risk   Fear of Current or Ex-Partner: No   Emotionally Abused: No   Physically Abused: No   Sexually Abused: No      Review of Systems  Constitutional:  Positive for fatigue. Negative for fever.  HENT:  Positive for congestion and postnasal drip. Negative for mouth sores.   Respiratory:  Positive for cough. Negative for shortness of breath and wheezing.   Cardiovascular:  Negative for chest pain.  Gastrointestinal:  Positive for nausea.  Genitourinary:  Negative for flank pain.  Neurological:  Positive for headaches.  Psychiatric/Behavioral:  Negative.     Vital Signs: Temp 100 F (37.8 C)  Ht 5\' 6"  (1.676 m)   Wt 192 lb (87.1 kg)   BMI 30.99 kg/m    Observation/Objective:  Pt is able to carry out conversation   Assessment/Plan: 1. Acute COVID-19 Will start zpak and prednisone taper. Pt will call if not improving or any side effects develop. Advised to stay well hydrated and rest. - azithromycin (ZITHROMAX) 250 MG tablet; Take one tab a day for 10 days for uri  Dispense: 10 tablet; Refill: 0 - predniSONE (DELTASONE) 10 MG tablet; Take one tab 3 x day for 3 days, then take one tab 2 x a day for 3 days and then take one tab a day for 3 days for copd  Dispense: 18 tablet; Refill: 0  2. Chronic asthma, mild intermittent, uncomplicated Continue inhalers as before   General Counseling: Rosezena Sensor understanding of the findings of today's phone visit and agrees with plan of treatment. I have discussed any further diagnostic evaluation that may be needed or ordered today. We also reviewed his medications today. he has been encouraged to call the office with any questions or concerns that should arise related to todays visit.    No orders of the defined types were placed in this encounter.   Meds ordered this encounter  Medications   azithromycin (ZITHROMAX) 250 MG tablet    Sig: Take one tab a day for 10 days for uri    Dispense:  10 tablet    Refill:  0   predniSONE (DELTASONE) 10 MG tablet    Sig: Take one tab 3 x day for 3 days, then take one tab 2 x a day for 3 days and then take one tab a day for 3 days for copd    Dispense:  18 tablet    Refill:  0    Time spent:25 Minutes    Dr Lavera Guise Internal medicine

## 2021-11-10 NOTE — Telephone Encounter (Signed)
Pt had virtual visit.

## 2021-11-16 ENCOUNTER — Telehealth: Payer: Self-pay | Admitting: Oncology

## 2021-11-16 DIAGNOSIS — Z20822 Contact with and (suspected) exposure to covid-19: Secondary | ICD-10-CM | POA: Diagnosis not present

## 2021-11-16 NOTE — Telephone Encounter (Signed)
Wife called to cancel pt's appt. He has Covid. Please give a call back at [phone]

## 2021-11-16 NOTE — Telephone Encounter (Signed)
Please r/s to mid Jan, per MD

## 2021-11-17 ENCOUNTER — Other Ambulatory Visit: Payer: Self-pay | Admitting: Oncology

## 2021-11-19 ENCOUNTER — Other Ambulatory Visit: Payer: Self-pay | Admitting: Oncology

## 2021-11-20 ENCOUNTER — Inpatient Hospital Stay: Payer: Medicare Other

## 2021-11-22 ENCOUNTER — Inpatient Hospital Stay: Payer: Medicare Other | Admitting: Oncology

## 2021-11-24 ENCOUNTER — Ambulatory Visit: Payer: Medicare Other | Admitting: Internal Medicine

## 2021-12-18 ENCOUNTER — Other Ambulatory Visit: Payer: Self-pay

## 2021-12-18 ENCOUNTER — Ambulatory Visit (INDEPENDENT_AMBULATORY_CARE_PROVIDER_SITE_OTHER): Payer: Medicare Other | Admitting: Physician Assistant

## 2021-12-18 ENCOUNTER — Encounter: Payer: Self-pay | Admitting: Physician Assistant

## 2021-12-18 DIAGNOSIS — I5022 Chronic systolic (congestive) heart failure: Secondary | ICD-10-CM | POA: Diagnosis not present

## 2021-12-18 DIAGNOSIS — G4733 Obstructive sleep apnea (adult) (pediatric): Secondary | ICD-10-CM

## 2021-12-18 DIAGNOSIS — R0602 Shortness of breath: Secondary | ICD-10-CM

## 2021-12-18 DIAGNOSIS — I10 Essential (primary) hypertension: Secondary | ICD-10-CM

## 2021-12-18 DIAGNOSIS — J4520 Mild intermittent asthma, uncomplicated: Secondary | ICD-10-CM

## 2021-12-18 DIAGNOSIS — Z9989 Dependence on other enabling machines and devices: Secondary | ICD-10-CM

## 2021-12-18 NOTE — Progress Notes (Signed)
[facility], [place] [address]  Pulmonary Sleep Medicine   Office Visit Note  Patient Name: Mike Decker DOB: 1941-04-28 MRN [id number]  Date of Service: 12/20/2021  Complaints/HPI: Pt is here for routine pulmonary follow up. He is still having some congestion and postnasal drip since covid. Feels like drainage in back of throat at times. He is otherwise doing well and breathing has been ok. Reviewed his spiro in office which is normal. He continues to take his allergy medications and inhalers. Also using flonase and mucinex as needed. Requests trelegy sample and one was given in office. Continues to be followed by cardiology.  ROS  General: (-) fever, (-) chills, (-) night sweats, (-) weakness Skin: (-) rashes, (-) itching,. Eyes: (-) visual changes, (-) redness, (-) itching. Nose and Sinuses: (-) nasal stuffiness or itchiness, (+) postnasal drip, (-) nosebleeds, (-) sinus trouble. Mouth and Throat: (-) sore throat, (-) hoarseness. Neck: (-) swollen glands, (-) enlarged thyroid, (-) neck pain. Respiratory: + cough, (-) bloody sputum, - shortness of breath, - wheezing. Cardiovascular: - ankle swelling, (-) chest pain. Lymphatic: (-) lymph node enlargement. Neurologic: (-) numbness, (-) tingling. Psychiatric: (-) anxiety, (-) depression   Current Medication: Outpatient Encounter Medications as of 12/18/2021  Medication Sig   allopurinol (ZYLOPRIM) 100 MG tablet Take 100 mg by mouth daily.    azelastine (ASTELIN) 0.1 % nasal spray USE 1 TO 2 SPRAYS IN EACH NOSTRIL TWICE A DAY   azithromycin (ZITHROMAX) 250 MG tablet Take one tab a day for 10 days for uri   cetirizine (ZYRTEC) 5 MG tablet Take 5 mg by mouth daily as needed for allergies or rhinitis.    citalopram (CELEXA) 10 MG tablet TAKE 1 TABLET EVERY DAY   clopidogrel (PLAVIX) 75 MG tablet Take 1 tablet by mouth daily.   colchicine 0.6 MG tablet Take 3 tablets (1.8 mg total) by mouth  daily as needed (gout flares).   [employer] VITAMIN B12 1000 MCG tablet TAKE 1 TABLET BY MOUTH EVERY DAY   dextromethorphan-guaiFENesin (MUCINEX DM) 30-600 MG 12hr tablet Take 1 tablet by mouth 2 (two) times daily as needed for cough.   EPINEPHrine (EPI-PEN) 0.3 mg/0.3 mL SOAJ injection Inject into the muscle as needed (anaphylaxis).    ferrous sulfate 325 (65 FE) MG EC tablet TAKE 1 TABLET BY MOUTH EVERY DAY   fluticasone (FLONASE) 50 MCG/ACT nasal spray USE 1 SPRAY IN EACH NOSTRIL EVERY DAY   Fluticasone-Umeclidin-Vilant (TRELEGY ELLIPTA) 100-62.5-25 MCG/INH AEPB Inhale 1 puff into the lungs daily.   folic acid (FOLVITE) 937 MCG tablet TAKE 1 TABLET BY MOUTH DAILY.   hydrALAZINE (APRESOLINE) 50 MG tablet TAKE ONE TABLET BY MOUTH TWICE A DAY HOLD DOSE FOR SYSTOLIC BLOOD PRESSURE  LESS THAN 100   insulin aspart (NOVOLOG) 100 UNIT/ML FlexPen 15 UNITS IN AM, 25 UNITS AT LUNCH, 25 UNITS AT DINNER   insulin glargine (LANTUS) 100 UNIT/ML injection Inject 0.35 mLs (35 Units total) into the skin at bedtime. This is a decrease from your previous 45 units nightly. (Patient taking differently: Inject 60 Units into the skin at bedtime. This is a decrease from your previous 45 units nightly.)   ipratropium-albuterol (DUONEB) 0.5-2.5 (3) MG/3ML SOLN Take 3 mLs by nebulization every 4 (four) hours as needed.   isosorbide mononitrate (IMDUR) 120 MG 24 hr tablet    magnesium oxide (MAG-OX) 400 (240 Mg) MG tablet Take by mouth.   methocarbamol (ROBAXIN) 500 MG tablet Take 500 mg by  mouth 2 (two) times daily.    metoprolol tartrate (LOPRESSOR) 50 MG tablet Take 50 mg by mouth 2 (two) times daily.   montelukast (SINGULAIR) 10 MG tablet TAKE 1 TABLET BY MOUTH EVERYDAY AT BEDTIME   nitroGLYCERIN (NITROSTAT) 0.4 MG SL tablet Place 0.4 mg under the tongue every 5 (five) minutes x 3 doses as needed for chest pain.    Omega-3 Fatty Acids (FISH OIL) 1000 MG CAPS Take 1 capsule by mouth daily.    pantoprazole (PROTONIX) 40 MG  tablet Take 1 tablet (40 mg total) by mouth 2 (two) times daily before a meal.   predniSONE (DELTASONE) 10 MG tablet Take one tab 3 x day for 3 days, then take one tab 2 x a day for 3 days and then take one tab a day for 3 days for copd   ranolazine (RANEXA) 500 MG 12 hr tablet Take 1 tablet by mouth 2 (two) times daily.   rosuvastatin (CRESTOR) 40 MG tablet Take 40 mg by mouth every evening.    sacubitril-valsartan (ENTRESTO) 24-26 MG Take 1 tablet by mouth 2 (two) times daily.   sodium bicarbonate 650 MG tablet Take 650 mg by mouth 2 (two) times daily.   Torsemide 40 MG TABS Take 40 mg by mouth once daily   Vitamin D, Ergocalciferol, 50000 units CAPS Take 1 capsule by mouth every 30 (thirty) days.   No facility-administered encounter medications on file as of 12/18/2021.    Surgical History: Past Surgical History:  Procedure Laterality Date   CATARACT EXTRACTION  2011, 2012   COLONOSCOPY N/A 12/14/2018   Procedure: COLONOSCOPY;  Surgeon: Virgel Manifold, MD;  Location: [facility] ENDOSCOPY;  Service: Endoscopy;  Laterality: N/A;   CORONARY ANGIOPLASTY WITH STENT PLACEMENT  2012   CORONARY STENT INTERVENTION N/A 12/29/2019   Procedure: CORONARY STENT INTERVENTION;  Surgeon: Nelva Bush, MD;  Location: [place] [facility];  Service: Cardiovascular;  Laterality: N/A;   ESOPHAGOGASTRODUODENOSCOPY N/A 12/14/2018   Procedure: ESOPHAGOGASTRODUODENOSCOPY (EGD);  Surgeon: Virgel Manifold, MD;  Location: [facility] ENDOSCOPY;  Service: Endoscopy;  Laterality: N/A;   ESOPHAGOGASTRODUODENOSCOPY (EGD) WITH PROPOFOL N/A 06/01/2016   Procedure: ESOPHAGOGASTRODUODENOSCOPY (EGD) WITH PROPOFOL;  Surgeon: Manya Silvas, MD;  Location: [facility] ENDOSCOPY;  Service: Endoscopy;  Laterality: N/A;   HERNIA REPAIR     x2   INTRAOCULAR LENS INSERTION     LEFT HEART CATH AND CORONARY ANGIOGRAPHY N/A 05/09/2017   Procedure: Left Heart Cath and Coronary Angiography;  Surgeon: Yolonda Kida, MD;  Location: [place] [facility];  Service: Cardiovascular;  Laterality: N/A;   LEFT HEART CATH AND CORONARY ANGIOGRAPHY N/A 12/08/2018   Procedure: LEFT HEART CATH AND CORONARY ANGIOGRAPHY and possible PCI and stent;  Surgeon: Yolonda Kida, MD;  Location: [place] [facility];  Service: Cardiovascular;  Laterality: N/A;   LEFT HEART CATH AND CORONARY ANGIOGRAPHY N/A 12/29/2019   Procedure: LEFT HEART CATH AND CORONARY ANGIOGRAPHY;  Surgeon: Nelva Bush, MD;  Location: [place] [facility];  Service: Cardiovascular;  Laterality: N/A;   NOSE SURGERY     submucous resection   PROSTATE SURGERY  2002   ROTATOR CUFF REPAIR Right     Medical History: Past Medical History:  Diagnosis Date   Anemia    Arthritis    Atrioventricular canal (AVC)    irregular heart beats   Barrett esophagus    Bronchiolitis    Cancer ([place]) 2002   prostate, esphageal   Chronic diastolic CHF (congestive heart failure) (  [place])    Colon polyp    Diabetes mellitus without complication ([place])    Diverticulosis    Gout    Heart disease    Hemangioma    liver   Hyperlipidemia    Hypertension    Myocardial infarct ([place])    Ocular hypertension    Peripheral vascular disease (HCC)    Skin cancer    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[place])    Sleep apnea    Vitreoretinal degeneration     Family History: Family History  Problem Relation Age of Onset   Arthritis Mother    Stroke Maternal Grandfather    Breast cancer Neg Hx     Social History: Social History   Socioeconomic History   Marital status: Married    Spouse name: Not on file   Number of children: 2   Years of education: College 3 years   Highest education level: Associate degree: occupational, Hotel manager, or vocational program  Occupational History   Occupation: retired  Tobacco Use   Smoking status: Former    Years: 20.00    Types: Cigarettes    Quit date: 12/10/1976    Years since quitting: 45.0   Smokeless tobacco: Never  Substance and Sexual Activity   Alcohol  use: No   Drug use: No   Sexual activity: Not Currently  Other Topics Concern   Not on file  Social History Narrative   Not on file   Social Determinants of Health   Financial Resource Strain: Low Risk    Difficulty of Paying Living Expenses: Not very hard  Food Insecurity: No Food Insecurity   Worried About Charity fundraiser in the Last Year: Never true   [place] in the Last Year: Never true  Transportation Needs: No Transportation Needs   Lack of Transportation (Medical): No   Lack of Transportation (Non-Medical): No  Physical Activity: Insufficiently Active   Days of Exercise per Week: 7 days   Minutes of Exercise per Session: 20 min  Stress: No Stress Concern Present   Feeling of Stress : Not at all  Social Connections: Moderately Isolated   Frequency of Communication with Friends and Family: Once a week   Frequency of Social Gatherings with Friends and Family: Once a week   Attends Religious Services: More than 4 times per year   Active Member of [employer] or Organizations: No   Attends Archivist Meetings: Never   Marital Status: Married  Human resources officer Violence: Not At Risk   Fear of Current or Ex-Partner: No   Emotionally Abused: No   Physically Abused: No   Sexually Abused: No    Vital Signs: Blood pressure (!) 146/54, pulse (!) 59, temperature 98 F (36.7 C), resp. rate 16, height 5\' 6"  (1.676 m), weight 200 lb 6.4 oz (90.9 kg), SpO2 99 %.  Examination: General Appearance: The patient is well-developed, well-nourished, and in no distress. Skin: Gross inspection of skin unremarkable. Head: normocephalic, no gross deformities. Eyes: no gross deformities noted. ENT: ears appear grossly normal no exudates. Neck: Supple. No thyromegaly. No LAD. Respiratory: Lungs clear to auscultation bilaterally. Cardiovascular: Normal S1 and S2. +murmur . Extremities: No cyanosis. pulses are equal. Neurologic: Alert and oriented. No involuntary  movements.  LABS: Recent Results (from the past 2160 hour(s))  Comprehensive metabolic panel     Status: Abnormal   Collection Time: 10/03/21  3:18 PM  Result Value Ref Range   Sodium 138 135 - 145 mmol/L   Potassium  4.7 3.5 - 5.1 mmol/L   Chloride 103 98 - 111 mmol/L   CO2 25 22 - 32 mmol/L   Glucose, Bld 176 (H) 70 - 99 mg/dL    Comment: Glucose reference range applies only to samples taken after fasting for at least 8 hours.   BUN 66 (H) 8 - 23 mg/dL   Creatinine, Ser 2.83 (H) 0.61 - 1.24 mg/dL   Calcium 8.7 (L) 8.9 - 10.3 mg/dL   Total Protein 7.1 6.5 - 8.1 g/dL   Albumin 4.1 3.5 - 5.0 g/dL   AST 15 15 - 41 U/L   ALT 15 0 - 44 U/L   Alkaline Phosphatase 66 38 - 126 U/L   Total Bilirubin 0.7 0.3 - 1.2 mg/dL   GFR, Estimated 22 (L) >60 mL/min    Comment: (NOTE) Calculated using the CKD-EPI Creatinine Equation (2021)    Anion gap 10 5 - 15    Comment: Performed at [facility], [address] [place], [place] [address]  Brain natriuretic peptide     Status: None   Collection Time: 10/03/21  3:18 PM  Result Value Ref Range   B Natriuretic Peptide 98.8 0.0 - 100.0 pg/mL    Comment: Performed at [facility], [address] [place], [place] [address]    Radiology: No results found.  No results found.  No results found.    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[place]) 06/28/2021   Urinary tract infection without hematuria 11/01/2020   Dysuria 11/01/2020   Hyposmolality and/or hyponatremia 09/14/2020   Cough 09/13/2020   Bronchitis 07/15/2020   Palpitations 07/15/2020   Aortic atherosclerosis ([place]) 06/21/2020   Heart failure ([place]) 06/21/2020   Otalgia of both ears 05/27/2020   Lobar pneumonia ([place])    Type 2 diabetes mellitus with stage 4 chronic kidney disease, without long-term current use of insulin ([place]) 12/14/2019   Elevated troponin    Acidosis 10/30/2019    Benign 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 10/30/2019   Hyperkalemia 10/30/2019   Secondary hyperparathyroidism of renal origin ([place]) 10/30/2019   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   Encounter for fitting and adjustment of hearing aid 06/16/2019   Health maintenance alteration 06/16/2019   Prostate cancer ([place]) 06/16/2019   DM type 2 with diabetic peripheral neuropathy ([place]) 01/25/2019   CKD (chronic kidney disease) stage 4, GFR 15-29 ml/min (HCC) 01/21/2019   Gout 01/21/2019   MR (mitral regurgitation) 01/21/2019   Chronic obstructive pulmonary disease ([place]) 01/08/2019   OSA (obstructive sleep apnea) 01/08/2019   Chronic diastolic heart failure ([place]) 12/30/2018   Polyp of sigmoid colon    Benign neoplasm of ascending colon    Hemorrhoids without complication    Diverticulosis of large intestine without diverticulitis    Reflux esophagitis    Lymphangiectasia    Arteriovenous malformation of duodenum    Symptomatic anemia    SOB (shortness of breath)    Iron deficiency anemia due to chronic blood loss    Unstable angina (HCC)    Anemia of chronic renal failure, stage 4 (severe) ([place])    Chest pain 11/14/2018   Sciatica 11/12/2018   CAD (coronary artery disease) 02/27/2018   Sacroiliac joint pain 10/08/2017   Pain in limb 09/24/2017   Neuropathy 07/24/2017   NSTEMI (non-ST elevated myocardial infarction) ([place]) 05/06/2017   PAD (peripheral artery disease) ([place]) 05/01/2017   History of stroke 01/02/2017  Barrett's esophagus 10/02/2016   Carotid stenosis 10/02/2016   Essential hypertension 10/02/2016   Hyperlipidemia 10/02/2016   Diabetes ([place]) 10/02/2016   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 Chronic asthma, mild intermittent, uncomplicated Continue  inhaler as before  2. Chronic systolic congestive heart failure ([place]) Followed by cardiology  3. Essential hypertension Slightly elevated in office with borderline low HR, continue to monitor and f/u with PCP  4. OSA on CPAP Continue cpap nightly  5. Shortness of breath - Spirometry with graph was normal today   General Counseling: I have discussed the findings of the evaluation and examination with Mike Decker.  I have also discussed any further diagnostic evaluation thatmay be needed or ordered today. Mike Decker understanding of the findings of todays visit. We also reviewed his medications today and discussed drug interactions and side effects including but not limited excessive drowsiness and altered mental states. We also discussed that there is always a risk not just to him but also people around him. he has been encouraged to call the office with any questions or concerns that should arise related to todays visit.  Orders Placed This Encounter  Procedures   Spirometry with graph    Standing Status:   Future    Number of Occurrences:   1    Standing Expiration Date:   12/18/2022    Order Specific Question:   Where should this test be performed?    Answer:   [facility]    Order Specific Question:   Basic spirometry    Answer:   Yes     Time spent: 30  I have personally obtained a history, examined the patient, evaluated laboratory and imaging results, formulated the assessment and plan and placed orders. This patient was seen by Drema Dallas, PA-C in collaboration with Dr. Devona Konig as a part of collaborative care agreement.     Allyne Gee, MD [facility] Pulmonary and Critical Care Sleep medicine

## 2021-12-20 ENCOUNTER — Other Ambulatory Visit: Payer: Self-pay | Admitting: *Deleted

## 2021-12-20 DIAGNOSIS — D500 Iron deficiency anemia secondary to blood loss (chronic): Secondary | ICD-10-CM

## 2021-12-20 DIAGNOSIS — E538 Deficiency of other specified B group vitamins: Secondary | ICD-10-CM

## 2021-12-20 DIAGNOSIS — N1832 Chronic kidney disease, stage 3b: Secondary | ICD-10-CM

## 2021-12-20 NOTE — Patient Instructions (Signed)
Asthma, Adult Asthma is a long-term (chronic) condition that causes recurrent episodes in which the airways become tight and narrow. The airways are the passages that lead from the nose and mouth down into the lungs. Asthma episodes, also called asthma attacks, can cause coughing, wheezing, shortness of breath, and chest pain. The airways can also fill with mucus. During an attack, it can be difficult to breathe. Asthma attacks can range from minor to life threatening. Asthma cannot be cured, but medicines and lifestyle changes can help control it and treat acute attacks. What are the causes? This condition is believed to be caused by inherited (genetic) and environmental factors, but its exact cause is not known. There are many things that can bring on an asthma attack or make asthma symptoms worse (triggers). Asthma triggers are different for each person. Common triggers include: Mold. Dust. Cigarette smoke. Cockroaches. Things that can cause allergy symptoms (allergens), such as animal dander or pollen from trees or grass. Air pollutants such as household cleaners, wood smoke, smog, or Advertising account planner. Cold air, weather changes, and winds (which increase molds and pollen in the air). Strong emotional expressions such as crying or laughing hard. Stress. Certain medicines (such as aspirin) or types of medicines (such as beta-blockers). Sulfites in foods and drinks. Foods and drinks that may contain sulfites include dried fruit, potato chips, and sparkling grape juice. Infections or inflammatory conditions such as the flu, a cold, or inflammation of the nasal membranes (rhinitis). Gastroesophageal reflux disease (GERD). Exercise or strenuous activity. What are the signs or symptoms? Symptoms of this condition may occur right after asthma is triggered or many hours later. Symptoms include: Wheezing. This can sound like whistling when you breathe. Excessive nighttime or early morning  coughing. Frequent or severe coughing with a common cold. Chest tightness. Shortness of breath. Tiredness (fatigue) with minimal activity. How is this diagnosed? This condition is diagnosed based on: Your medical history. A physical exam. Tests, which may include: Lung function studies and pulmonary studies (spirometry). These tests can evaluate the flow of air in your lungs. Allergy tests. Imaging tests, such as X-rays. How is this treated? There is no cure for this condition, but treatment can help control your symptoms. Treatment for asthma usually involves: Identifying and avoiding your asthma triggers. Using medicines to control your symptoms. Generally, two types of medicines are used to treat asthma: Controller medicines. These help prevent asthma symptoms from occurring. They are usually taken every day. Fast-acting reliever or rescue medicines. These quickly relieve asthma symptoms by widening the narrow and tight airways. They are used as needed and provide short-term relief. Using supplemental oxygen. This may be needed during a severe episode. Using other medicines, such as: Allergy medicines, such as antihistamines, if your asthma attacks are triggered by allergens. Immune medicines (immunomodulators). These are medicines that help control the immune system. Creating an asthma action plan. An asthma action plan is a written plan for managing and treating your asthma attacks. This plan includes: A list of your asthma triggers and how to avoid them. Information about when medicines should be taken and when their dosage should be changed. Instructions about using a device called a peak flow meter. A peak flow meter measures how well the lungs are working and the severity of your asthma. It helps you monitor your condition. Follow these instructions at home: Controlling your home environment Control your home environment in the following ways to help avoid triggers and prevent  asthma attacks: Change your heating  and air conditioning filter regularly. °Limit your use of fireplaces and wood stoves. °Get rid of pests (such as roaches and mice) and their droppings. °Throw away plants if you see mold on them. °Clean floors and dust surfaces regularly. Use unscented cleaning products. °Try to have someone else vacuum for you regularly. Stay out of rooms while they are being vacuumed and for a short while afterward. If you vacuum, use a dust mask from a hardware store, a double-layered or microfilter vacuum cleaner bag, or a vacuum cleaner with a HEPA filter. °Replace carpet with wood, tile, or vinyl flooring. Carpet can trap dander and dust. °Use allergy-proof pillows, mattress covers, and box spring covers. °Keep your bedroom a trigger-free room. °Avoid pets and keep windows closed when allergens are in the air. °Wash beddings every week in hot water and dry them in a dryer. °Use blankets that are made of polyester or cotton. °Clean bathrooms and kitchens with bleach. If possible, have someone repaint the walls in these rooms with mold-resistant paint. Stay out of the rooms that are being cleaned and painted. °Wash your hands often with soap and water. If soap and water are not available, use hand sanitizer. °Do not allow anyone to smoke in your home. °General instructions °Take over-the-counter and prescription medicines only as told by your health care provider. °Speak with your health care provider if you have questions about how or when to take the medicines. °Make note if you are requiring more frequent dosages. °Do not use any products that contain nicotine or tobacco, such as cigarettes and e-cigarettes. If you need help quitting, ask your health care provider. Also, avoid being exposed to secondhand smoke. °Use a peak flow meter as told by your health care provider. Record and keep track of the readings. °Understand and use the asthma action plan to help minimize, or stop an asthma  attack, without needing to seek medical care. °Make sure you stay up to date on your yearly vaccinations as told by your health care provider. This may include vaccines for the flu and pneumonia. °Avoid outdoor activities when allergen counts are high and when air quality is low. °Wear a ski mask that covers your nose and mouth during outdoor winter activities. Exercise indoors on cold days if you can. °Warm up before exercising, and take time for a cool-down period after exercise. °Keep all follow-up visits as told by your health care provider. This is important. °Where to find more information °For information about asthma, turn to the Centers for Disease Control and Prevention at www.cdc.gov/asthma/faqs °For air quality information, turn to AirNow at airnow.gov °Contact a health care provider if: °You have wheezing, shortness of breath, or a cough even while you are taking medicine to prevent attacks. °The mucus you cough up (sputum) is thicker than usual. °Your sputum changes from clear or white to yellow, green, gray, or bloody. °Your medicines are causing side effects, such as a rash, itching, swelling, or trouble breathing. °You need to use a reliever medicine more than 2-3 times a week. °Your peak flow reading is still at 50-79% of your personal best after following your action plan for 1 hour. °You have a fever. °Get help right away if: °You are getting worse and do not respond to treatment during an asthma attack. °You are short of breath when at rest or when doing very little physical activity. °You have difficulty eating, drinking, or talking. °You have chest pain or tightness. °You develop a fast heartbeat or   palpitations. You have a bluish color to your lips or fingernails. You are light-headed or dizzy, or you faint. Your peak flow reading is less than 50% of your personal best. You feel too tired to breathe normally. Summary Asthma is a long-term (chronic) condition that causes recurrent  episodes in which the airways become tight and narrow. These episodes can cause coughing, wheezing, shortness of breath, and chest pain. Asthma cannot be cured, but medicines and lifestyle changes can help control it and treat acute attacks. Make sure you understand how to avoid triggers and how and when to use your medicines. Asthma attacks can range from minor to life threatening. Get help right away if you have an asthma attack and do not respond to treatment with your usual rescue medicines. This information is not intended to replace advice given to you by your health care provider. Make sure you discuss any questions you have with your health care provider. Document Revised: 08/26/2020 Document Reviewed: 03/30/2020 Elsevier Patient Education  2022 [employer].

## 2021-12-25 ENCOUNTER — Inpatient Hospital Stay: Payer: Medicare Other | Attending: Oncology

## 2021-12-25 ENCOUNTER — Other Ambulatory Visit: Payer: Self-pay

## 2021-12-25 DIAGNOSIS — D631 Anemia in chronic kidney disease: Secondary | ICD-10-CM | POA: Diagnosis not present

## 2021-12-25 DIAGNOSIS — E538 Deficiency of other specified B group vitamins: Secondary | ICD-10-CM | POA: Insufficient documentation

## 2021-12-25 DIAGNOSIS — Z85820 Personal history of malignant melanoma of skin: Secondary | ICD-10-CM | POA: Diagnosis not present

## 2021-12-25 DIAGNOSIS — E1122 Type 2 diabetes mellitus with diabetic chronic kidney disease: Secondary | ICD-10-CM | POA: Insufficient documentation

## 2021-12-25 DIAGNOSIS — N1832 Chronic kidney disease, stage 3b: Secondary | ICD-10-CM | POA: Diagnosis not present

## 2021-12-25 DIAGNOSIS — I130 Hypertensive heart and chronic kidney disease with heart failure and stage 1 through stage 4 chronic kidney disease, or unspecified chronic kidney disease: Secondary | ICD-10-CM | POA: Diagnosis not present

## 2021-12-25 DIAGNOSIS — I5032 Chronic diastolic (congestive) heart failure: Secondary | ICD-10-CM | POA: Diagnosis not present

## 2021-12-25 DIAGNOSIS — D500 Iron deficiency anemia secondary to blood loss (chronic): Secondary | ICD-10-CM

## 2021-12-25 LAB — CBC WITH DIFFERENTIAL/PLATELET
Abs Immature Granulocytes: 0.07 10*3/uL (ref 0.00–0.07)
Basophils Absolute: 0.1 10*3/uL (ref 0.0–0.1)
Basophils Relative: 1 %
Eosinophils Absolute: 0.7 10*3/uL — ABNORMAL HIGH (ref 0.0–0.5)
Eosinophils Relative: 7 %
HCT: 36.1 % — ABNORMAL LOW (ref 39.0–52.0)
Hemoglobin: 12.2 g/dL — ABNORMAL LOW (ref 13.0–17.0)
Immature Granulocytes: 1 %
Lymphocytes Relative: 10 %
Lymphs Abs: 0.9 10*3/uL (ref 0.7–4.0)
MCH: 32.3 pg (ref 26.0–34.0)
MCHC: 33.8 g/dL (ref 30.0–36.0)
MCV: 95.5 fL (ref 80.0–100.0)
Monocytes Absolute: 0.8 10*3/uL (ref 0.1–1.0)
Monocytes Relative: 9 %
Neutro Abs: 6.5 10*3/uL (ref 1.7–7.7)
Neutrophils Relative %: 72 %
Platelets: 218 10*3/uL (ref 150–400)
RBC: 3.78 MIL/uL — ABNORMAL LOW (ref 4.22–5.81)
RDW: 14.8 % (ref 11.5–15.5)
WBC: 9 10*3/uL (ref 4.0–10.5)
nRBC: 0 % (ref 0.0–0.2)

## 2021-12-25 LAB — COMPREHENSIVE METABOLIC PANEL
ALT: 13 U/L (ref 0–44)
AST: 16 U/L (ref 15–41)
Albumin: 3.9 g/dL (ref 3.5–5.0)
Alkaline Phosphatase: 58 U/L (ref 38–126)
Anion gap: 12 (ref 5–15)
BUN: 55 mg/dL — ABNORMAL HIGH (ref 8–23)
CO2: 24 mmol/L (ref 22–32)
Calcium: 8.8 mg/dL — ABNORMAL LOW (ref 8.9–10.3)
Chloride: 97 mmol/L — ABNORMAL LOW (ref 98–111)
Creatinine, Ser: 2.04 mg/dL — ABNORMAL HIGH (ref 0.61–1.24)
GFR, Estimated: 32 mL/min — ABNORMAL LOW (ref >60)
Glucose, Bld: 272 mg/dL — ABNORMAL HIGH (ref 70–99)
Potassium: 4.6 mmol/L (ref 3.5–5.1)
Sodium: 133 mmol/L — ABNORMAL LOW (ref 135–145)
Total Bilirubin: 0.5 mg/dL (ref 0.3–1.2)
Total Protein: 6.7 g/dL (ref 6.5–8.1)

## 2021-12-25 LAB — IRON AND TIBC
Iron: 69 ug/dL (ref 45–182)
Saturation Ratios: 22 % (ref 17.9–39.5)
TIBC: 311 ug/dL (ref 250–450)
UIBC: 242 ug/dL

## 2021-12-25 LAB — VITAMIN B12: Vitamin B-12: 623 pg/mL (ref 180–914)

## 2021-12-25 LAB — FERRITIN: Ferritin: 187 ng/mL (ref 24–336)

## 2021-12-26 ENCOUNTER — Encounter: Payer: Self-pay | Admitting: Oncology

## 2021-12-26 ENCOUNTER — Inpatient Hospital Stay (HOSPITAL_BASED_OUTPATIENT_CLINIC_OR_DEPARTMENT_OTHER): Payer: Medicare Other | Admitting: Oncology

## 2021-12-26 VITALS — BP 126/67 | HR 64 | Temp 97.6°F | Wt 200.3 lb

## 2021-12-26 DIAGNOSIS — N1832 Chronic kidney disease, stage 3b: Secondary | ICD-10-CM

## 2021-12-26 DIAGNOSIS — E538 Deficiency of other specified B group vitamins: Secondary | ICD-10-CM

## 2021-12-26 DIAGNOSIS — D631 Anemia in chronic kidney disease: Secondary | ICD-10-CM

## 2021-12-26 DIAGNOSIS — E1122 Type 2 diabetes mellitus with diabetic chronic kidney disease: Secondary | ICD-10-CM | POA: Diagnosis not present

## 2021-12-26 DIAGNOSIS — I5032 Chronic diastolic (congestive) heart failure: Secondary | ICD-10-CM | POA: Diagnosis not present

## 2021-12-26 DIAGNOSIS — I130 Hypertensive heart and chronic kidney disease with heart failure and stage 1 through stage 4 chronic kidney disease, or unspecified chronic kidney disease: Secondary | ICD-10-CM | POA: Diagnosis not present

## 2021-12-26 NOTE — Progress Notes (Signed)
Hematology/Oncology Follow Up Note [facility]  [phone] [phone]  Patient Care Team: Cletis Athens, MD as PCP - General (Internal Medicine) P.A., Cletis Athens, MD as Referring Physician (Cardiology) Christene Lye, MD (General Surgery) Alisa Graff, FNP as Nurse Practitioner (Family Medicine) Yolonda Kida, MD as Consulting Physician (Cardiology) Gabriel Carina Betsey Holiday, MD as Physician Assistant (Endocrinology) Lavonia Dana, MD as Consulting Physician (Nephrology) Allyne Gee, MD as Consulting Physician (Pulmonary Disease) [name], MD as Consulting Physician (Gastroenterology)   Name of the patient: Mike Decker  [id number]  Jul 24, 1941   REASON FOR VISIT  follow-up for management of iron deficiency anemia, anemia and CKD, recent hospitalization   PERTINENT HEMATOLOGY Oncology HISTORY received IV Venofer daily x3 as well as multiple units of PRBC transfusion. GI was consulted and patient had EGD done during his hospitalization.  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  Treated with argon plasma coagulation.  Colonoscopy showed polyps which were removed. Patient was discharged on 12/15/2018.  # 03/23/2019 mammogram showed gynecomastia.  #History of cutaneous melanoma status post Mohs surgery. 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 95% stenosis in the left circumflex status post drug-eluting stent. Patient is on aspirin and Plavix prior to this cardiac event. 04/18/2020, patient has a ER visit due to chest pain. Troponin was negative. Pain improved with medication. Patient was discharged with outpatient cardiology follow-up.   INTERVAL HISTORY 81 y.o. male with PMH listed as below present to follow-up for anemia of CKD.  Patient reports that he has had COVID-19 infection in December  2022.  He feels that he is doing a recovering process.  Otherwise no new complaints.  Patient takes vitamin B12 supplementation 2-3 times per week.  Review of Systems  Constitutional:  Negative for appetite change, chills, fatigue, fever and unexpected weight change.  HENT:   Negative for hearing loss and voice change.   Eyes:  Negative for eye problems and icterus.  Respiratory:  Negative for chest tightness, cough and shortness of breath.   Cardiovascular:  Negative for chest pain and leg swelling.  Gastrointestinal:  Negative for abdominal distention and abdominal pain.  Endocrine: Negative for hot flashes.  Genitourinary:  Negative for difficulty urinating, dysuria and frequency.   Musculoskeletal:  Negative for arthralgias.  Skin:  Negative for itching and rash.  Neurological:  Negative for light-headedness and numbness.  Hematological:  Negative for adenopathy. Does not bruise/bleed easily.  Psychiatric/Behavioral:  Negative for confusion.    Allergies  Allergen Reactions   Gabapentin     Other reaction(s): Other (See Comments) Tremors   Peanut-Containing Drug Products Anaphylaxis   Penicillins Hives and Rash    Has patient had a PCN reaction causing immediate rash, facial/tongue/throat swelling, SOB or lightheadedness with hypotension: Yes Has patient had a PCN reaction causing severe rash involving mucus membranes or skin necrosis: No Has patient had a PCN reaction that required hospitalization No Has patient had a PCN reaction occurring within the last 10 years: No If all of the above answers are "NO", then may proceed with Cephalosporin use.   Bee Venom Swelling   Influenza Vaccines Hives   Inh [Isoniazid] Hives   Jardiance [Empagliflozin] Other (See Comments)    Diarrhea   Kenalog [Triamcinolone Acetonide] Hives   Levaquin [Levofloxacin] Other (See Comments)    Tendon, ligament pain.    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, esphageal   Chronic diastolic CHF (congestive heart failure) (HCC)    Colon polyp    Diabetes mellitus without complication ([place])    Diverticulosis    Gout    Heart disease    Hemangioma    liver   Hyperlipidemia    Hypertension    Myocardial infarct [facility])    Ocular hypertension    Peripheral vascular disease ([place])    Skin cancer    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[place])    Sleep apnea    Vitreoretinal degeneration      Past Surgical History:  Procedure Laterality Date   CATARACT EXTRACTION  2011, 2012   COLONOSCOPY N/A 12/14/2018   Procedure: COLONOSCOPY;  Surgeon: Virgel Manifold, MD;  Location: [facility] ENDOSCOPY;  Service: Endoscopy;  Laterality: N/A;   CORONARY ANGIOPLASTY WITH STENT PLACEMENT  2012   CORONARY STENT INTERVENTION N/A 12/29/2019   Procedure: CORONARY STENT INTERVENTION;  Surgeon: Nelva Bush, MD;  Location: [place] [facility];  Service: Cardiovascular;  Laterality: N/A;   ESOPHAGOGASTRODUODENOSCOPY N/A 12/14/2018   Procedure: ESOPHAGOGASTRODUODENOSCOPY (EGD);  Surgeon: Virgel Manifold, MD;  Location: [facility] ENDOSCOPY;  Service: Endoscopy;  Laterality: N/A;   ESOPHAGOGASTRODUODENOSCOPY (EGD) WITH PROPOFOL N/A 06/01/2016   Procedure: ESOPHAGOGASTRODUODENOSCOPY (EGD) WITH PROPOFOL;  Surgeon: Manya Silvas, MD;  Location: [facility] ENDOSCOPY;  Service: Endoscopy;  Laterality: N/A;   HERNIA REPAIR     x2   INTRAOCULAR LENS INSERTION     LEFT HEART CATH AND CORONARY ANGIOGRAPHY N/A 05/09/2017   Procedure: Left Heart Cath and Coronary Angiography;  Surgeon: Yolonda Kida, MD;  Location: [place] [facility] LAB;  Service: Cardiovascular;  Laterality: N/A;   LEFT HEART CATH AND CORONARY ANGIOGRAPHY N/A  12/08/2018   Procedure: LEFT HEART CATH AND CORONARY ANGIOGRAPHY and possible PCI and stent;  Surgeon: Yolonda Kida, MD;  Location: [place] CV LAB;  Service: Cardiovascular;  Laterality: N/A;   LEFT HEART CATH AND CORONARY ANGIOGRAPHY N/A 12/29/2019   Procedure: LEFT HEART CATH AND CORONARY ANGIOGRAPHY;  Surgeon: Nelva Bush, MD;  Location: [place] [facility];  Service: Cardiovascular;  Laterality: N/A;   NOSE SURGERY     submucous resection   PROSTATE SURGERY  2002   ROTATOR CUFF REPAIR Right     Social History   Socioeconomic History   Marital status: Married    Spouse name: Not on file   Number of children: 2   Years of education: College 3 years   Highest education level: Associate degree: occupational, Hotel manager, or vocational program  Occupational History   Occupation: retired  Tobacco Use   Smoking status: Former    Years: 20.00    Types: Cigarettes    Quit date: 12/10/1976    Years since quitting: 45.0   Smokeless tobacco: Never  Substance and Sexual Activity   Alcohol use: No   Drug use: No   Sexual activity: Not Currently  Other Topics Concern   Not on file  Social History Narrative   Not on file   Social Determinants of Health   Financial  Resource Strain: Low Risk    Difficulty of Paying Living Expenses: Not very hard  Food Insecurity: No Food Insecurity   Worried About Charity fundraiser in the Last Year: Never true   Ran Out of Food in the Last Year: Never true  Transportation Needs: No Transportation Needs   Lack of Transportation (Medical): No   Lack of Transportation (Non-Medical): No  Physical Activity: Insufficiently Active   Days of Exercise per Week: 7 days   Minutes of Exercise per Session: 20 min  Stress: No Stress Concern Present   Feeling of Stress : Not at all  Social Connections: Moderately Isolated   Frequency of Communication with Friends and Family: Once a week   Frequency of Social Gatherings with Friends and Family:  Once a week   Attends Religious Services: More than 4 times per year   Active Member of [employer] or Organizations: No   Attends Music therapist: Never   Marital Status: Married  Human resources officer Violence: Not At Risk   Fear of Current or Ex-Partner: No   Emotionally Abused: No   Physically Abused: No   Sexually Abused: No    Family History  Problem Relation Age of Onset   Arthritis Mother    Stroke Maternal Grandfather    Breast cancer Neg Hx      Current Outpatient Medications:    allopurinol (ZYLOPRIM) 100 MG tablet, Take 100 mg by mouth daily. , Disp: , Rfl:    azelastine (ASTELIN) 0.1 % nasal spray, USE 1 TO 2 SPRAYS IN EACH NOSTRIL TWICE A DAY, Disp: , Rfl:    azithromycin (ZITHROMAX) 250 MG tablet, Take one tab a day for 10 days for uri, Disp: 10 tablet, Rfl: 0   cetirizine (ZYRTEC) 5 MG tablet, Take 5 mg by mouth daily as needed for allergies or rhinitis. , Disp: , Rfl:    citalopram (CELEXA) 10 MG tablet, TAKE 1 TABLET EVERY DAY, Disp: 90 tablet, Rfl: 3   clopidogrel (PLAVIX) 75 MG tablet, Take 1 tablet by mouth daily., Disp: , Rfl:    colchicine 0.6 MG tablet, Take 3 tablets (1.8 mg total) by mouth daily as needed (gout flares)., Disp: , Rfl:    [employer] VITAMIN B12 1000 MCG tablet, TAKE 1 TABLET BY MOUTH EVERY DAY, Disp: 30 tablet, Rfl: 0   dextromethorphan-guaiFENesin (MUCINEX DM) 30-600 MG 12hr tablet, Take 1 tablet by mouth 2 (two) times daily as needed for cough., Disp: , Rfl:    EPINEPHrine (EPI-PEN) 0.3 mg/0.3 mL SOAJ injection, Inject into the muscle as needed (anaphylaxis). , Disp: , Rfl:    ferrous sulfate 325 (65 FE) MG EC tablet, TAKE 1 TABLET BY MOUTH EVERY DAY, Disp: 90 tablet, Rfl: 3   fluticasone (FLONASE) 50 MCG/ACT nasal spray, USE 1 SPRAY IN EACH NOSTRIL EVERY DAY, Disp: 32 g, Rfl: 6   Fluticasone-Umeclidin-Vilant (TRELEGY ELLIPTA) 100-62.5-25 MCG/INH AEPB, Inhale 1 puff into the lungs daily., Disp: 3 each, Rfl: 4   folic acid (FOLVITE) 401 MCG  tablet, TAKE 1 TABLET BY MOUTH DAILY., Disp: 100 tablet, Rfl: 3   hydrALAZINE (APRESOLINE) 50 MG tablet, TAKE ONE TABLET BY MOUTH TWICE A DAY HOLD DOSE FOR SYSTOLIC BLOOD PRESSURE  LESS THAN 100, Disp: , Rfl:    insulin aspart (NOVOLOG) 100 UNIT/ML FlexPen, 15 UNITS IN AM, 25 UNITS AT LUNCH, 25 UNITS AT DINNER, Disp: , Rfl:    insulin glargine (LANTUS) 100 UNIT/ML injection, Inject 0.35 mLs (35 Units total) into the skin  at bedtime. This is a decrease from your previous 45 units nightly. (Patient taking differently: Inject 60 Units into the skin at bedtime. This is a decrease from your previous 45 units nightly.), Disp: 10 mL, Rfl: 11   ipratropium-albuterol (DUONEB) 0.5-2.5 (3) MG/3ML SOLN, Take 3 mLs by nebulization every 4 (four) hours as needed., Disp: 360 mL, Rfl: 0   isosorbide mononitrate (IMDUR) 120 MG 24 hr tablet, , Disp: , Rfl:    magnesium oxide (MAG-OX) 400 (240 Mg) MG tablet, Take by mouth., Disp: , Rfl:    methocarbamol (ROBAXIN) 500 MG tablet, Take 500 mg by mouth 2 (two) times daily. , Disp: , Rfl: 0   metoprolol tartrate (LOPRESSOR) 50 MG tablet, Take 50 mg by mouth 2 (two) times daily., Disp: , Rfl:    montelukast (SINGULAIR) 10 MG tablet, TAKE 1 TABLET BY MOUTH EVERYDAY AT BEDTIME, Disp: 90 tablet, Rfl: 1   nitroGLYCERIN (NITROSTAT) 0.4 MG SL tablet, Place 0.4 mg under the tongue every 5 (five) minutes x 3 doses as needed for chest pain. , Disp: , Rfl:    Omega-3 Fatty Acids (FISH OIL) 1000 MG CAPS, Take 1 capsule by mouth daily. , Disp: , Rfl:    pantoprazole (PROTONIX) 40 MG tablet, Take 1 tablet (40 mg total) by mouth 2 (two) times daily before a meal., Disp: , Rfl:    predniSONE (DELTASONE) 10 MG tablet, Take one tab 3 x day for 3 days, then take one tab 2 x a day for 3 days and then take one tab a day for 3 days for copd, Disp: 18 tablet, Rfl: 0   ranolazine (RANEXA) 500 MG 12 hr tablet, Take 1 tablet by mouth 2 (two) times daily., Disp: , Rfl:    rosuvastatin (CRESTOR) 40 MG  tablet, Take 40 mg by mouth every evening. , Disp: , Rfl:    sacubitril-valsartan (ENTRESTO) 24-26 MG, Take 1 tablet by mouth 2 (two) times daily., Disp: 180 tablet, Rfl: 3   sodium bicarbonate 650 MG tablet, Take 650 mg by mouth 2 (two) times daily., Disp: , Rfl:    Torsemide 40 MG TABS, Take 40 mg by mouth once daily, Disp: , Rfl:    Vitamin D, Ergocalciferol, 50000 units CAPS, Take 1 capsule by mouth every 30 (thirty) days., Disp: , Rfl:   Physical exam:  Vitals:   12/26/21 1313  BP: 126/67  Pulse: 64  Temp: 97.6 F (36.4 C)  TempSrc: Tympanic  Weight: 200 lb 4.8 oz (90.9 kg)   Physical Exam Constitutional:      General: He is not in acute distress.    Appearance: He is obese.  HENT:     Head: Normocephalic and atraumatic.  Eyes:     General: No scleral icterus.    Pupils: Pupils are equal, round, and reactive to light.  Cardiovascular:     Rate and Rhythm: Normal rate and regular rhythm.     Heart sounds: Normal heart sounds.  Pulmonary:     Effort: Pulmonary effort is normal. No respiratory distress.     Breath sounds: No wheezing.  Abdominal:     General: Bowel sounds are normal. There is no distension.     Palpations: Abdomen is soft. There is no mass.     Tenderness: There is no abdominal tenderness.  Musculoskeletal:        General: No deformity. Normal range of motion.     Cervical back: Normal range of motion and neck supple.  Skin:  General: Skin is warm and dry.     Findings: No erythema or rash.  Neurological:     Mental Status: He is alert and oriented to person, place, and time. Mental status is at baseline.     Cranial Nerves: No cranial nerve deficit.     Coordination: Coordination normal.  Psychiatric:        Mood and Affect: Mood normal.    CMP Latest Ref Rng & Units 12/25/2021  Glucose 70 - 99 mg/dL 272(H)  BUN 8 - 23 mg/dL 55(H)  Creatinine 0.61 - 1.24 mg/dL 2.04(H)  Sodium 135 - 145 mmol/L 133(L)  Potassium 3.5 - 5.1 mmol/L 4.6  Chloride  98 - 111 mmol/L 97(L)  CO2 22 - 32 mmol/L 24  Calcium 8.9 - 10.3 mg/dL 8.8(L)  Total Protein 6.5 - 8.1 g/dL 6.7  Total Bilirubin 0.3 - 1.2 mg/dL 0.5  Alkaline Phos 38 - 126 U/L 58  AST 15 - 41 U/L 16  ALT 0 - 44 U/L 13   CBC Latest Ref Rng & Units 12/25/2021  WBC 4.0 - 10.5 K/uL 9.0  Hemoglobin 13.0 - 17.0 g/dL 12.2(L)  Hematocrit 39.0 - 52.0 % 36.1(L)  Platelets 150 - 400 K/uL 218    No results found.   Assessment and plan  1. 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 B12 deficiency    #Anemia in CKD Labs are reviewed and are discussed with patient Hemoglobin is 12.2.  No intervention needed. Continue oral iron supplementation . History of Vitamin B12 deficiency, negative intrinsic factor antibody and antiparital antibody.  B12 level is stable.  Recommend patient to continue oral vitamin B12 supplementation 2-3 times per week  Follow up in 12 months   Orders Placed This Encounter  Procedures   CBC with Differential/Platelet    Standing Status:   Future    Standing Expiration Date:   12/26/2022   Comprehensive metabolic panel    Standing Status:   Future    Standing Expiration Date:   12/26/2022   Ferritin    Standing Status:   Future    Standing Expiration Date:   12/26/2022   Iron and TIBC    Standing Status:   Future    Standing Expiration Date:   12/26/2022   Vitamin B12    Standing Status:   Future    Standing Expiration Date:   12/26/2022       Earlie Server, MD, PhD

## 2022-01-01 ENCOUNTER — Other Ambulatory Visit: Payer: Self-pay

## 2022-01-01 DIAGNOSIS — N2581 Secondary hyperparathyroidism of renal origin: Secondary | ICD-10-CM | POA: Diagnosis not present

## 2022-01-01 DIAGNOSIS — N184 Chronic kidney disease, stage 4 (severe): Secondary | ICD-10-CM | POA: Diagnosis not present

## 2022-01-01 DIAGNOSIS — E875 Hyperkalemia: Secondary | ICD-10-CM | POA: Diagnosis not present

## 2022-01-01 DIAGNOSIS — I129 Hypertensive chronic kidney disease with stage 1 through stage 4 chronic kidney disease, or unspecified chronic kidney disease: Secondary | ICD-10-CM | POA: Diagnosis not present

## 2022-01-01 DIAGNOSIS — E1122 Type 2 diabetes mellitus with diabetic chronic kidney disease: Secondary | ICD-10-CM | POA: Diagnosis not present

## 2022-01-01 DIAGNOSIS — D631 Anemia in chronic kidney disease: Secondary | ICD-10-CM | POA: Diagnosis not present

## 2022-01-01 DIAGNOSIS — E872 Acidosis, unspecified: Secondary | ICD-10-CM | POA: Diagnosis not present

## 2022-01-01 DIAGNOSIS — R0602 Shortness of breath: Secondary | ICD-10-CM

## 2022-01-02 ENCOUNTER — Encounter: Payer: Self-pay | Admitting: Internal Medicine

## 2022-01-02 ENCOUNTER — Other Ambulatory Visit: Payer: Self-pay

## 2022-01-02 ENCOUNTER — Ambulatory Visit (INDEPENDENT_AMBULATORY_CARE_PROVIDER_SITE_OTHER): Payer: Medicare Other | Admitting: Internal Medicine

## 2022-01-02 VITALS — BP 125/53 | HR 57 | Ht 66.0 in | Wt 199.8 lb

## 2022-01-02 DIAGNOSIS — I2510 Atherosclerotic heart disease of native coronary artery without angina pectoris: Secondary | ICD-10-CM

## 2022-01-02 DIAGNOSIS — D631 Anemia in chronic kidney disease: Secondary | ICD-10-CM

## 2022-01-02 DIAGNOSIS — I10 Essential (primary) hypertension: Secondary | ICD-10-CM | POA: Diagnosis not present

## 2022-01-02 DIAGNOSIS — G4733 Obstructive sleep apnea (adult) (pediatric): Secondary | ICD-10-CM

## 2022-01-02 DIAGNOSIS — Z794 Long term (current) use of insulin: Secondary | ICD-10-CM | POA: Diagnosis not present

## 2022-01-02 DIAGNOSIS — E1165 Type 2 diabetes mellitus with hyperglycemia: Secondary | ICD-10-CM | POA: Diagnosis not present

## 2022-01-02 DIAGNOSIS — N184 Chronic kidney disease, stage 4 (severe): Secondary | ICD-10-CM

## 2022-01-02 DIAGNOSIS — I5042 Chronic combined systolic (congestive) and diastolic (congestive) heart failure: Secondary | ICD-10-CM | POA: Diagnosis not present

## 2022-01-02 DIAGNOSIS — I2583 Coronary atherosclerosis due to lipid rich plaque: Secondary | ICD-10-CM

## 2022-01-02 NOTE — Assessment & Plan Note (Signed)
Stable at the present time. 

## 2022-01-02 NOTE — Progress Notes (Signed)
Established Patient Office Visit  Subjective:  Patient ID: Mike Decker, male    DOB: December 21, 1940  Age: 81 y.o. 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, esphageal   Chronic diastolic CHF (congestive heart failure) ([place])    Colon polyp    Diabetes mellitus without complication ([place])    Diverticulosis    Gout    Heart disease    Hemangioma    liver   Hyperlipidemia    Hypertension    Myocardial infarct [facility])    Ocular hypertension    Peripheral vascular disease ([place])    Skin cancer    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[place])    Sleep apnea    Vitreoretinal degeneration     Past Surgical History:  Procedure Laterality Date   CATARACT EXTRACTION  2011, 2012   COLONOSCOPY N/A 12/14/2018   Procedure: COLONOSCOPY;  Surgeon: Virgel Manifold, MD;  Location: [facility] ENDOSCOPY;  Service: Endoscopy;  Laterality: N/A;   CORONARY ANGIOPLASTY WITH STENT PLACEMENT  2012   CORONARY STENT INTERVENTION N/A 12/29/2019   Procedure: CORONARY STENT INTERVENTION;  Surgeon: Nelva Bush, MD;  Location: [place] [facility];  Service: Cardiovascular;  Laterality: N/A;   ESOPHAGOGASTRODUODENOSCOPY N/A 12/14/2018   Procedure: ESOPHAGOGASTRODUODENOSCOPY (EGD);  Surgeon: Virgel Manifold, MD;  Location: [facility] ENDOSCOPY;  Service: Endoscopy;  Laterality: N/A;   ESOPHAGOGASTRODUODENOSCOPY (EGD) WITH PROPOFOL N/A 06/01/2016   Procedure: ESOPHAGOGASTRODUODENOSCOPY (EGD) WITH PROPOFOL;  Surgeon: Manya Silvas, MD;  Location: [facility] ENDOSCOPY;  Service: Endoscopy;  Laterality: N/A;   HERNIA REPAIR     x2   INTRAOCULAR LENS INSERTION     LEFT HEART CATH AND CORONARY ANGIOGRAPHY N/A 05/09/2017    Procedure: Left Heart Cath and Coronary Angiography;  Surgeon: Yolonda Kida, MD;  Location: [place] [facility];  Service: Cardiovascular;  Laterality: N/A;   LEFT HEART CATH AND CORONARY ANGIOGRAPHY N/A 12/08/2018   Procedure: LEFT HEART CATH AND CORONARY ANGIOGRAPHY and possible PCI and stent;  Surgeon: Yolonda Kida, MD;  Location: [place] [facility];  Service: Cardiovascular;  Laterality: N/A;   LEFT HEART CATH AND CORONARY ANGIOGRAPHY N/A 12/29/2019   Procedure: LEFT HEART CATH AND CORONARY ANGIOGRAPHY;  Surgeon: Nelva Bush, MD;  Location: [place] [facility];  Service: Cardiovascular;  Laterality: N/A;   NOSE SURGERY     submucous resection   PROSTATE SURGERY  2002   ROTATOR CUFF REPAIR Right     Family History  Problem Relation Age of Onset   Arthritis Mother    Stroke Maternal Grandfather    Breast cancer Neg Hx     Social History   Socioeconomic History   Marital status: Married    Spouse name: Not on file   Number of children: 2   Years of education: College 3 years   Highest education level: Associate degree: occupational, Hotel manager, or vocational program  Occupational History   Occupation: retired  Tobacco Use   Smoking status: Former    Years: 20.00    Types: Cigarettes    Quit date: 12/10/1976    Years since quitting: 61.0  Smokeless tobacco: Never  Substance and Sexual Activity   Alcohol use: No   Drug use: No   Sexual activity: Not Currently  Other Topics Concern   Not on file  Social History Narrative   Not on file   Social Determinants of Health   Financial Resource Strain: Low Risk    Difficulty of Paying Living Expenses: Not very hard  Food Insecurity: No Food Insecurity   Worried About Running Out of Food in the Last Year: Never true   Ran Out of Food in the Last Year: Never true  Transportation Needs: No Transportation Needs   Lack of Transportation (Medical): No   Lack of Transportation (Non-Medical): No  Physical  Activity: Insufficiently Active   Days of Exercise per Week: 7 days   Minutes of Exercise per Session: 20 min  Stress: No Stress Concern Present   Feeling of Stress : Not at all  Social Connections: Moderately Isolated   Frequency of Communication with Friends and Family: Once a week   Frequency of Social Gatherings with Friends and Family: Once a week   Attends Religious Services: More than 4 times per year   Active Member of [employer] or Organizations: No   Attends Archivist Meetings: Never   Marital Status: Married  Human resources officer Violence: Not At Risk   Fear of Current or Ex-Partner: No   Emotionally Abused: No   Physically Abused: No   Sexually Abused: No     Current Outpatient Medications:    allopurinol (ZYLOPRIM) 100 MG tablet, Take 100 mg by mouth daily. , Disp: , Rfl:    azelastine (ASTELIN) 0.1 % nasal spray, USE 1 TO 2 SPRAYS IN EACH NOSTRIL TWICE A DAY, Disp: , Rfl:    cetirizine (ZYRTEC) 5 MG tablet, Take 5 mg by mouth daily as needed for allergies or rhinitis. , Disp: , Rfl:    citalopram (CELEXA) 10 MG tablet, TAKE 1 TABLET EVERY DAY, Disp: 90 tablet, Rfl: 3   clopidogrel (PLAVIX) 75 MG tablet, Take 1 tablet by mouth daily., Disp: , Rfl:    colchicine 0.6 MG tablet, Take 3 tablets (1.8 mg total) by mouth daily as needed (gout flares)., Disp: , Rfl:    [employer] VITAMIN B12 1000 MCG tablet, TAKE 1 TABLET BY MOUTH EVERY DAY, Disp: 30 tablet, Rfl: 0   dextromethorphan-guaiFENesin (MUCINEX DM) 30-600 MG 12hr tablet, Take 1 tablet by mouth 2 (two) times daily as needed for cough., Disp: , Rfl:    EPINEPHrine (EPI-PEN) 0.3 mg/0.3 mL SOAJ injection, Inject into the muscle as needed (anaphylaxis). , Disp: , Rfl:    ferrous sulfate 325 (65 FE) MG EC tablet, TAKE 1 TABLET BY MOUTH EVERY DAY, Disp: 90 tablet, Rfl: 3   fluticasone (FLONASE) 50 MCG/ACT nasal spray, USE 1 SPRAY IN EACH NOSTRIL EVERY DAY, Disp: 32 g, Rfl: 6   Fluticasone-Umeclidin-Vilant (TRELEGY ELLIPTA)  100-62.5-25 MCG/INH AEPB, Inhale 1 puff into the lungs daily., Disp: 3 each, Rfl: 4   folic acid (FOLVITE) 330 MCG tablet, TAKE 1 TABLET BY MOUTH DAILY., Disp: 100 tablet, Rfl: 3   hydrALAZINE (APRESOLINE) 50 MG tablet, TAKE ONE TABLET BY MOUTH TWICE A DAY HOLD DOSE FOR SYSTOLIC BLOOD PRESSURE  LESS THAN 100, Disp: , Rfl:    insulin aspart (NOVOLOG) 100 UNIT/ML FlexPen, 15 UNITS IN AM, 25 UNITS AT LUNCH, 25 UNITS AT DINNER, Disp: , Rfl:    insulin glargine (LANTUS) 100 UNIT/ML injection, Inject 0.35 mLs (35 Units total) into the skin  at bedtime. This is a decrease from your previous 45 units nightly. (Patient taking differently: Inject 60 Units into the skin at bedtime. This is a decrease from your previous 45 units nightly.), Disp: 10 mL, Rfl: 11   ipratropium-albuterol (DUONEB) 0.5-2.5 (3) MG/3ML SOLN, Take 3 mLs by nebulization every 4 (four) hours as needed., Disp: 360 mL, Rfl: 0   isosorbide mononitrate (IMDUR) 120 MG 24 hr tablet, , Disp: , Rfl:    magnesium oxide (MAG-OX) 400 (240 Mg) MG tablet, Take by mouth., Disp: , Rfl:    methocarbamol (ROBAXIN) 500 MG tablet, Take 500 mg by mouth 2 (two) times daily. , Disp: , Rfl: 0   metoprolol tartrate (LOPRESSOR) 50 MG tablet, Take 50 mg by mouth 2 (two) times daily., Disp: , Rfl:    montelukast (SINGULAIR) 10 MG tablet, TAKE 1 TABLET BY MOUTH EVERYDAY AT BEDTIME, Disp: 90 tablet, Rfl: 1   nitroGLYCERIN (NITROSTAT) 0.4 MG SL tablet, Place 0.4 mg under the tongue every 5 (five) minutes x 3 doses as needed for chest pain. , Disp: , Rfl:    Omega-3 Fatty Acids (FISH OIL) 1000 MG CAPS, Take 1 capsule by mouth daily. , Disp: , Rfl:    pantoprazole (PROTONIX) 40 MG tablet, Take 1 tablet (40 mg total) by mouth 2 (two) times daily before a meal., Disp: , Rfl:    predniSONE (DELTASONE) 10 MG tablet, Take one tab 3 x day for 3 days, then take one tab 2 x a day for 3 days and then take one tab a day for 3 days for copd, Disp: 18 tablet, Rfl: 0   ranolazine  (RANEXA) 500 MG 12 hr tablet, Take 1 tablet by mouth 2 (two) times daily., Disp: , Rfl:    rosuvastatin (CRESTOR) 40 MG tablet, Take 40 mg by mouth every evening. , Disp: , Rfl:    sacubitril-valsartan (ENTRESTO) 24-26 MG, Take 1 tablet by mouth 2 (two) times daily., Disp: 180 tablet, Rfl: 3   sodium bicarbonate 650 MG tablet, Take 650 mg by mouth 2 (two) times daily., Disp: , Rfl:    Torsemide 40 MG TABS, Take 40 mg by mouth once daily, Disp: , Rfl:    Vitamin D, Ergocalciferol, 50000 units CAPS, Take 1 capsule by mouth every 30 (thirty) days., Disp: , Rfl:    Allergies  Allergen Reactions   Gabapentin     Other reaction(s): Other (See Comments) Tremors   Peanut-Containing Drug Products Anaphylaxis   Penicillins Hives and Rash    Has patient had a PCN reaction causing immediate rash, facial/tongue/throat swelling, SOB or lightheadedness with hypotension: Yes Has patient had a PCN reaction causing severe rash involving mucus membranes or skin necrosis: No Has patient had a PCN reaction that required hospitalization No Has patient had a PCN reaction occurring within the last 10 years: No If all of the above answers are "NO", then may proceed with Cephalosporin use.   Bee Venom Swelling   Influenza Vaccines Hives   Inh [Isoniazid] Hives   Jardiance [Empagliflozin] Other (See Comments)    Diarrhea   Kenalog [Triamcinolone Acetonide] Hives   Levaquin [Levofloxacin] Other (See Comments)    Tendon, ligament pain.    Lisinopril     Other reaction(s): Hyperkalemia   Nalfon [Fenoprofen Calcium] Hives   Naproxen    Peanut Oil    Nsaids Rash    Nalfon 600 Nalfon 600    ROS Review of Systems  Constitutional: Negative.   HENT: Negative.  Eyes: Negative.   Respiratory: Negative.    Cardiovascular: Negative.   Gastrointestinal: Negative.   Endocrine: Negative.   Genitourinary: Negative.   Musculoskeletal: Negative.   Skin: Negative.   Allergic/Immunologic: Negative.    Neurological: Negative.   Hematological: Negative.   Psychiatric/Behavioral: Negative.    All other systems reviewed and are negative.    Objective:    Physical Exam Vitals reviewed.  Constitutional:      Appearance: Normal appearance.  HENT:     Mouth/Throat:     Mouth: Mucous membranes are moist.  Eyes:     Pupils: Pupils are equal, round, and reactive to light.  Neck:     Vascular: No carotid bruit.  Cardiovascular:     Rate and Rhythm: Normal rate and regular rhythm.     Pulses: Normal pulses.     Heart sounds: Normal heart sounds.  Pulmonary:     Effort: Pulmonary effort is normal.     Breath sounds: Normal breath sounds.  Abdominal:     General: Bowel sounds are normal.     Palpations: Abdomen is soft. There is no hepatomegaly, splenomegaly or mass.     Tenderness: There is no abdominal tenderness.     Hernia: No hernia is present.  Musculoskeletal:     Cervical back: Neck supple.     Right lower leg: No edema.     Left lower leg: No edema.  Skin:    Findings: No rash.  Neurological:     Mental Status: He is alert and oriented to person, place, and time.     Motor: No weakness.  Psychiatric:        Mood and Affect: Mood normal.        Behavior: Behavior normal.    BP (!) 125/53    Pulse (!) 57    Ht 5' 6" (1.676 m)    Wt 199 lb 12.8 oz (90.6 kg)    BMI 32.25 kg/m  Wt Readings from Last 3 Encounters:  01/02/22 199 lb 12.8 oz (90.6 kg)  12/26/21 200 lb 4.8 oz (90.9 kg)  12/18/21 200 lb 6.4 oz (90.9 kg)     Health Maintenance Due  Topic Date Due   FOOT EXAM  Never done   Zoster Vaccines- Shingrix (1 of 2) Never done   COVID-19 Vaccine (5 - Booster for Pfizer series) 06/26/2021   OPHTHALMOLOGY EXAM  10/31/2021   COLONOSCOPY (Pts 45-34yr Insurance coverage will need to be confirmed)  12/14/2021    There are no preventive care reminders to display for this patient.  Lab Results  Component Value Date   TSH 1.948 12/06/2018   Lab Results   Component Value Date   WBC 9.0 12/25/2021   HGB 12.2 (L) 12/25/2021   HCT 36.1 (L) 12/25/2021   MCV 95.5 12/25/2021   PLT 218 12/25/2021   Lab Results  Component Value Date   NA 133 (L) 12/25/2021   K 4.6 12/25/2021   CO2 24 12/25/2021   GLUCOSE 272 (H) 12/25/2021   BUN 55 (H) 12/25/2021   CREATININE 2.04 (H) 12/25/2021   BILITOT 0.5 12/25/2021   ALKPHOS 58 12/25/2021   AST 16 12/25/2021   ALT 13 12/25/2021   PROT 6.7 12/25/2021   ALBUMIN 3.9 12/25/2021   CALCIUM 8.8 (L) 12/25/2021   ANIONGAP 12 12/25/2021   EGFR 27 (L) 06/28/2021   Lab Results  Component Value Date   CHOL 115 12/29/2019   Lab Results  Component Value Date  HDL 31 (L) 12/29/2019   Lab Results  Component Value Date   LDLCALC 51 12/29/2019   Lab Results  Component Value Date   TRIG 163 (H) 12/29/2019   Lab Results  Component Value Date   CHOLHDL 3.7 12/29/2019   Lab Results  Component Value Date   HGBA1C 8.3 07/18/2021      Assessment & Plan:   Problem List Items Addressed This Visit       Cardiovascular and Mediastinum   Essential hypertension - Primary     Patient denies any chest pain or shortness of breath there is no history of palpitation or paroxysmal nocturnal dyspnea   patient was advised to follow low-salt low-cholesterol diet    ideally I want to keep systolic blood pressure below 130 mmHg, patient was asked to check blood pressure one times a week and give me a report on that.  Patient will be follow-up in 3 months  or earlier as needed, patient will call me back for any change in the cardiovascular symptoms Patient was advised to buy a book from local bookstore concerning blood pressure and read several chapters  every day.  This will be supplemented by some of the material we will give him from the office.  Patient should also utilize other resources like YouTube and Internet to learn more about the blood pressure and the diet.      CAD (coronary artery disease)     Patient denies any history of angina      Heart failure (HCC)    Congestive heart failure is stable        Respiratory   OSA (obstructive sleep apnea)    Stable at the present time        Endocrine   Uncontrolled type 2 diabetes mellitus with hyperglycemia, with long-term current use of insulin ([place])    - The patient's blood sugar is labile on med. - The patient will continue the current treatment regimen.  - I encouraged the patient to regularly check blood sugar.  - I encouraged the patient to monitor diet. I encouraged the patient to eat low-carb and low-sugar to help prevent blood sugar spikes.  - I encouraged the patient to continue following their prescribed treatment plan for diabetes - I informed the patient to get help if blood sugar drops below 61m/dL, or if suddenly have trouble thinking clearly or breathing.  Patient was advised to buy a book on diabetes from a local bookstore or from [place]  Patient should read 2 chapters every day to keep the motivation going, this is in addition to some of the materials we provided them from the office.  There are other resources on the Internet like YouTube and [employer] to get an education on the diabetes        Other   Anemia of chronic renal failure, stage 4 (severe) (HCC)    Stable at the present time       No orders of the defined types were placed in this encounter.   Follow-up: No follow-ups on file.    JCletis Athens MD

## 2022-01-02 NOTE — Assessment & Plan Note (Signed)
Patient denies any chest pain or shortness of breath there is no history of palpitation or paroxysmal nocturnal dyspnea °  patient was advised to follow low-salt low-cholesterol diet ° °  ideally I want to keep systolic blood pressure below 130 mmHg, patient was asked to check blood pressure one times a week and give me a report on that.  Patient will be follow-up in 3 months  or earlier as needed, patient will call me back for any change in the cardiovascular symptoms °Patient was advised to buy a book from local bookstore concerning blood pressure and read several chapters  every day.  This will be supplemented by some of the material we will give him from the office.  Patient should also utilize other resources like YouTube and Internet to learn more about the blood pressure and the diet. °

## 2022-01-02 NOTE — Assessment & Plan Note (Signed)
Congestive heart failure is stable 

## 2022-01-02 NOTE — Assessment & Plan Note (Signed)

## 2022-01-02 NOTE — Assessment & Plan Note (Signed)
Patient denies any history of angina

## 2022-01-02 NOTE — Progress Notes (Signed)
Established Patient Office Visit  Subjective:  Patient ID: Mike Decker, male    DOB: [birth year]  Age: 81 y.o. 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, esphageal   Chronic diastolic CHF (congestive heart failure) ([place])    Colon polyp    Diabetes mellitus without complication ([place])    Diverticulosis    Gout    Heart disease    Hemangioma    liver   Hyperlipidemia    Hypertension    Myocardial infarct [facility])    Ocular hypertension    Peripheral vascular disease ([place])    Skin cancer    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[place])    Sleep apnea    Vitreoretinal degeneration     Past Surgical History:  Procedure Laterality Date   CATARACT EXTRACTION  2011, 2012   COLONOSCOPY N/A 12/14/2018   Procedure: COLONOSCOPY;  Surgeon: Virgel Manifold, MD;  Location: [facility] ENDOSCOPY;  Service: Endoscopy;  Laterality: N/A;   CORONARY ANGIOPLASTY WITH STENT PLACEMENT  2012   CORONARY STENT INTERVENTION N/A 12/29/2019   Procedure: CORONARY STENT INTERVENTION;  Surgeon: Nelva Bush, MD;  Location: [place] [facility];  Service: Cardiovascular;  Laterality: N/A;   ESOPHAGOGASTRODUODENOSCOPY N/A 12/14/2018   Procedure: ESOPHAGOGASTRODUODENOSCOPY (EGD);  Surgeon: Virgel Manifold, MD;  Location: [facility] ENDOSCOPY;  Service: Endoscopy;  Laterality: N/A;   ESOPHAGOGASTRODUODENOSCOPY (EGD) WITH PROPOFOL N/A 06/01/2016   Procedure: ESOPHAGOGASTRODUODENOSCOPY (EGD) WITH PROPOFOL;  Surgeon: Manya Silvas, MD;  Location: [facility] ENDOSCOPY;  Service: Endoscopy;  Laterality: N/A;   HERNIA REPAIR     x2   INTRAOCULAR LENS INSERTION     LEFT HEART CATH AND CORONARY ANGIOGRAPHY N/A 05/09/2017    Procedure: Left Heart Cath and Coronary Angiography;  Surgeon: Yolonda Kida, MD;  Location: [place] [facility];  Service: Cardiovascular;  Laterality: N/A;   LEFT HEART CATH AND CORONARY ANGIOGRAPHY N/A 12/08/2018   Procedure: LEFT HEART CATH AND CORONARY ANGIOGRAPHY and possible PCI and stent;  Surgeon: Yolonda Kida, MD;  Location: [place] [facility];  Service: Cardiovascular;  Laterality: N/A;   LEFT HEART CATH AND CORONARY ANGIOGRAPHY N/A 12/29/2019   Procedure: LEFT HEART CATH AND CORONARY ANGIOGRAPHY;  Surgeon: Nelva Bush, MD;  Location: [place] [facility];  Service: Cardiovascular;  Laterality: N/A;   NOSE SURGERY     submucous resection   PROSTATE SURGERY  2002   ROTATOR CUFF REPAIR Right     Family History  Problem Relation Age of Onset   Arthritis Mother    Stroke Maternal Grandfather    Breast cancer Neg Hx     Social History   Socioeconomic History   Marital status: Married    Spouse name: Not on file   Number of children: 2   Years of education: College 3 years   Highest education level: Associate degree: occupational, Hotel manager, or vocational program  Occupational History   Occupation: retired  Tobacco Use   Smoking status: Former    Years: 20.00    Types: Cigarettes    Quit date: 12/10/1976    Years since quitting: 45.0   Smokeless tobacco: Never  Substance and Sexual  Activity   Alcohol use: No   Drug use: No   Sexual activity: Not Currently  Other Topics Concern   Not on file  Social History Narrative   Not on file   Social Determinants of Health   Financial Resource Strain: Low Risk    Difficulty of Paying Living Expenses: Not very hard  Food Insecurity: No Food Insecurity   Worried About Running Out of Food in the Last Year: Never true   Ran Out of Food in the Last Year: Never true  Transportation Needs: No Transportation Needs   Lack of Transportation (Medical): No   Lack of Transportation  (Non-Medical): No  Physical Activity: Insufficiently Active   Days of Exercise per Week: 7 days   Minutes of Exercise per Session: 20 min  Stress: No Stress Concern Present   Feeling of Stress : Not at all  Social Connections: Moderately Isolated   Frequency of Communication with Friends and Family: Once a week   Frequency of Social Gatherings with Friends and Family: Once a week   Attends Religious Services: More than 4 times per year   Active Member of [employer] or Organizations: No   Attends Archivist Meetings: Never   Marital Status: Married  Human resources officer Violence: Not At Risk   Fear of Current or Ex-Partner: No   Emotionally Abused: No   Physically Abused: No   Sexually Abused: No     Current Outpatient Medications:    allopurinol (ZYLOPRIM) 100 MG tablet, Take 100 mg by mouth daily. , Disp: , Rfl:    azelastine (ASTELIN) 0.1 % nasal spray, USE 1 TO 2 SPRAYS IN EACH NOSTRIL TWICE A DAY, Disp: , Rfl:    cetirizine (ZYRTEC) 5 MG tablet, Take 5 mg by mouth daily as needed for allergies or rhinitis. , Disp: , Rfl:    citalopram (CELEXA) 10 MG tablet, TAKE 1 TABLET EVERY DAY, Disp: 90 tablet, Rfl: 3   clopidogrel (PLAVIX) 75 MG tablet, Take 1 tablet by mouth daily., Disp: , Rfl:    colchicine 0.6 MG tablet, Take 3 tablets (1.8 mg total) by mouth daily as needed (gout flares)., Disp: , Rfl:    [employer] VITAMIN B12 1000 MCG tablet, TAKE 1 TABLET BY MOUTH EVERY DAY, Disp: 30 tablet, Rfl: 0   dextromethorphan-guaiFENesin (MUCINEX DM) 30-600 MG 12hr tablet, Take 1 tablet by mouth 2 (two) times daily as needed for cough., Disp: , Rfl:    EPINEPHrine (EPI-PEN) 0.3 mg/0.3 mL SOAJ injection, Inject into the muscle as needed (anaphylaxis). , Disp: , Rfl:    ferrous sulfate 325 (65 FE) MG EC tablet, TAKE 1 TABLET BY MOUTH EVERY DAY, Disp: 90 tablet, Rfl: 3   fluticasone (FLONASE) 50 MCG/ACT nasal spray, USE 1 SPRAY IN EACH NOSTRIL EVERY DAY, Disp: 32 g, Rfl: 6    Fluticasone-Umeclidin-Vilant (TRELEGY ELLIPTA) 100-62.5-25 MCG/INH AEPB, Inhale 1 puff into the lungs daily., Disp: 3 each, Rfl: 4   folic acid (FOLVITE) 993 MCG tablet, TAKE 1 TABLET BY MOUTH DAILY., Disp: 100 tablet, Rfl: 3   hydrALAZINE (APRESOLINE) 50 MG tablet, TAKE ONE TABLET BY MOUTH TWICE A DAY HOLD DOSE FOR SYSTOLIC BLOOD PRESSURE  LESS THAN 100, Disp: , Rfl:    insulin aspart (NOVOLOG) 100 UNIT/ML FlexPen, 15 UNITS IN AM, 25 UNITS AT LUNCH, 25 UNITS AT DINNER, Disp: , Rfl:    insulin glargine (LANTUS) 100 UNIT/ML injection, Inject 0.35 mLs (35 Units total) into the skin at bedtime. This is a decrease from  your previous 45 units nightly. (Patient taking differently: Inject 60 Units into the skin at bedtime. This is a decrease from your previous 45 units nightly.), Disp: 10 mL, Rfl: 11   ipratropium-albuterol (DUONEB) 0.5-2.5 (3) MG/3ML SOLN, Take 3 mLs by nebulization every 4 (four) hours as needed., Disp: 360 mL, Rfl: 0   isosorbide mononitrate (IMDUR) 120 MG 24 hr tablet, , Disp: , Rfl:    magnesium oxide (MAG-OX) 400 (240 Mg) MG tablet, Take by mouth., Disp: , Rfl:    methocarbamol (ROBAXIN) 500 MG tablet, Take 500 mg by mouth 2 (two) times daily. , Disp: , Rfl: 0   metoprolol tartrate (LOPRESSOR) 50 MG tablet, Take 50 mg by mouth 2 (two) times daily., Disp: , Rfl:    montelukast (SINGULAIR) 10 MG tablet, TAKE 1 TABLET BY MOUTH EVERYDAY AT BEDTIME, Disp: 90 tablet, Rfl: 1   nitroGLYCERIN (NITROSTAT) 0.4 MG SL tablet, Place 0.4 mg under the tongue every 5 (five) minutes x 3 doses as needed for chest pain. , Disp: , Rfl:    Omega-3 Fatty Acids (FISH OIL) 1000 MG CAPS, Take 1 capsule by mouth daily. , Disp: , Rfl:    pantoprazole (PROTONIX) 40 MG tablet, Take 1 tablet (40 mg total) by mouth 2 (two) times daily before a meal., Disp: , Rfl:    predniSONE (DELTASONE) 10 MG tablet, Take one tab 3 x day for 3 days, then take one tab 2 x a day for 3 days and then take one tab a day for  3 days for copd, Disp: 18 tablet, Rfl: 0   ranolazine (RANEXA) 500 MG 12 hr tablet, Take 1 tablet by mouth 2 (two) times daily., Disp: , Rfl:    rosuvastatin (CRESTOR) 40 MG tablet, Take 40 mg by mouth every evening. , Disp: , Rfl:    sacubitril-valsartan (ENTRESTO) 24-26 MG, Take 1 tablet by mouth 2 (two) times daily., Disp: 180 tablet, Rfl: 3   sodium bicarbonate 650 MG tablet, Take 650 mg by mouth 2 (two) times daily., Disp: , Rfl:    Torsemide 40 MG TABS, Take 40 mg by mouth once daily, Disp: , Rfl:    Vitamin D, Ergocalciferol, 50000 units CAPS, Take 1 capsule by mouth every 30 (thirty) days., Disp: , Rfl:    Allergies  Allergen Reactions   Gabapentin     Other reaction(s): Other (See Comments) Tremors   Peanut-Containing Drug Products Anaphylaxis   Penicillins Hives and Rash    Has patient had a PCN reaction causing immediate rash, facial/tongue/throat swelling, SOB or lightheadedness with hypotension: Yes Has patient had a PCN reaction causing severe rash involving mucus membranes or skin necrosis: No Has patient had a PCN reaction that required hospitalization No Has patient had a PCN reaction occurring within the last 10 years: No If all of the above answers are "NO", then may proceed with Cephalosporin use.   Bee Venom Swelling   Influenza Vaccines Hives   Inh [Isoniazid] Hives   Jardiance [Empagliflozin] Other (See Comments)    Diarrhea   Kenalog [Triamcinolone Acetonide] Hives   Levaquin [Levofloxacin] Other (See Comments)    Tendon, ligament pain.    Lisinopril     Other reaction(s): Hyperkalemia   Nalfon [Fenoprofen Calcium] Hives   Naproxen    Peanut Oil    Nsaids Rash    Nalfon 600 Nalfon 600    ROS Review of Systems    Objective:    Physical Exam  BP (!) 125/53  Pulse (!) 57    Ht _0  (1.676 m)    Wt 199 lb 12.8 oz (90.6 kg)    BMI 32.25 kg/m  Wt Readings from Last 3 Encounters:  01/02/22 199 lb 12.8 oz (90.6 kg)  12/26/21 200  lb 4.8 oz (90.9 kg)  12/18/21 200 lb 6.4 oz (90.9 kg)     Health Maintenance Due  Topic Date Due   FOOT EXAM  Never done   Zoster Vaccines- Shingrix (1 of 2) Never done   COVID-19 Vaccine (5 - Booster for Pfizer series) 06/26/2021   OPHTHALMOLOGY EXAM  10/31/2021   COLONOSCOPY (Pts 45-84yr Insurance coverage will need to be confirmed)  12/14/2021    There are no preventive care reminders to display for this patient.  Lab Results  Component Value Date   TSH 1.948 12/06/2018   Lab Results  Component Value Date   WBC 9.0 12/25/2021   HGB 12.2 (L) 12/25/2021   HCT 36.1 (L) 12/25/2021   MCV 95.5 12/25/2021   PLT 218 12/25/2021   Lab Results  Component Value Date   NA 133 (L) 12/25/2021   K 4.6 12/25/2021   CO2 24 12/25/2021   GLUCOSE 272 (H) 12/25/2021   BUN 55 (H) 12/25/2021   CREATININE 2.04 (H) 12/25/2021   BILITOT 0.5 12/25/2021   ALKPHOS 58 12/25/2021   AST 16 12/25/2021   ALT 13 12/25/2021   PROT 6.7 12/25/2021   ALBUMIN 3.9 12/25/2021   CALCIUM 8.8 (L) 12/25/2021   ANIONGAP 12 12/25/2021   EGFR 27 (L) 06/28/2021   Lab Results  Component Value Date   CHOL 115 12/29/2019   Lab Results  Component Value Date   HDL 31 (L) 12/29/2019   Lab Results  Component Value Date   LDLCALC 51 12/29/2019   Lab Results  Component Value Date   TRIG 163 (H) 12/29/2019   Lab Results  Component Value Date   CHOLHDL 3.7 12/29/2019   Lab Results  Component Value Date   HGBA1C 8.3 07/18/2021      Assessment & Plan:   Problem List Items Addressed This Visit   None   No orders of the defined types were placed in this encounter.   Follow-up: No follow-ups on file.    JCletis Athens MD

## 2022-01-03 ENCOUNTER — Ambulatory Visit (INDEPENDENT_AMBULATORY_CARE_PROVIDER_SITE_OTHER): Payer: Medicare Other | Admitting: Internal Medicine

## 2022-01-03 DIAGNOSIS — R0602 Shortness of breath: Secondary | ICD-10-CM

## 2022-01-05 DIAGNOSIS — I25110 Atherosclerotic heart disease of native coronary artery with unstable angina pectoris: Secondary | ICD-10-CM | POA: Diagnosis not present

## 2022-01-05 DIAGNOSIS — E119 Type 2 diabetes mellitus without complications: Secondary | ICD-10-CM | POA: Diagnosis not present

## 2022-01-05 DIAGNOSIS — I700 Atherosclerosis of aorta: Secondary | ICD-10-CM | POA: Diagnosis not present

## 2022-01-05 DIAGNOSIS — I739 Peripheral vascular disease, unspecified: Secondary | ICD-10-CM | POA: Diagnosis not present

## 2022-01-05 DIAGNOSIS — I6523 Occlusion and stenosis of bilateral carotid arteries: Secondary | ICD-10-CM | POA: Diagnosis not present

## 2022-01-05 DIAGNOSIS — I10 Essential (primary) hypertension: Secondary | ICD-10-CM | POA: Diagnosis not present

## 2022-01-05 DIAGNOSIS — J449 Chronic obstructive pulmonary disease, unspecified: Secondary | ICD-10-CM | POA: Diagnosis not present

## 2022-01-05 DIAGNOSIS — Z8673 Personal history of transient ischemic attack (TIA), and cerebral infarction without residual deficits: Secondary | ICD-10-CM | POA: Diagnosis not present

## 2022-01-05 DIAGNOSIS — N184 Chronic kidney disease, stage 4 (severe): Secondary | ICD-10-CM | POA: Diagnosis not present

## 2022-01-05 DIAGNOSIS — I214 Non-ST elevation (NSTEMI) myocardial infarction: Secondary | ICD-10-CM | POA: Diagnosis not present

## 2022-01-05 DIAGNOSIS — Z955 Presence of coronary angioplasty implant and graft: Secondary | ICD-10-CM | POA: Diagnosis not present

## 2022-01-05 DIAGNOSIS — I5032 Chronic diastolic (congestive) heart failure: Secondary | ICD-10-CM | POA: Diagnosis not present

## 2022-01-10 DIAGNOSIS — I129 Hypertensive chronic kidney disease with stage 1 through stage 4 chronic kidney disease, or unspecified chronic kidney disease: Secondary | ICD-10-CM | POA: Diagnosis not present

## 2022-01-10 DIAGNOSIS — D631 Anemia in chronic kidney disease: Secondary | ICD-10-CM | POA: Diagnosis not present

## 2022-01-10 DIAGNOSIS — E871 Hypo-osmolality and hyponatremia: Secondary | ICD-10-CM | POA: Diagnosis not present

## 2022-01-10 DIAGNOSIS — E1122 Type 2 diabetes mellitus with diabetic chronic kidney disease: Secondary | ICD-10-CM | POA: Diagnosis not present

## 2022-01-10 DIAGNOSIS — E8722 Chronic metabolic acidosis: Secondary | ICD-10-CM | POA: Diagnosis not present

## 2022-01-10 DIAGNOSIS — N2581 Secondary hyperparathyroidism of renal origin: Secondary | ICD-10-CM | POA: Diagnosis not present

## 2022-01-10 DIAGNOSIS — N184 Chronic kidney disease, stage 4 (severe): Secondary | ICD-10-CM | POA: Diagnosis not present

## 2022-01-10 DIAGNOSIS — E875 Hyperkalemia: Secondary | ICD-10-CM | POA: Diagnosis not present

## 2022-01-15 ENCOUNTER — Other Ambulatory Visit: Payer: Self-pay | Admitting: Internal Medicine

## 2022-01-16 ENCOUNTER — Ambulatory Visit: Payer: Medicare Other | Admitting: Internal Medicine

## 2022-01-18 DIAGNOSIS — I2510 Atherosclerotic heart disease of native coronary artery without angina pectoris: Secondary | ICD-10-CM | POA: Diagnosis not present

## 2022-01-18 DIAGNOSIS — I25110 Atherosclerotic heart disease of native coronary artery with unstable angina pectoris: Secondary | ICD-10-CM | POA: Diagnosis not present

## 2022-01-18 DIAGNOSIS — R079 Chest pain, unspecified: Secondary | ICD-10-CM | POA: Diagnosis not present

## 2022-01-18 DIAGNOSIS — I350 Nonrheumatic aortic (valve) stenosis: Secondary | ICD-10-CM | POA: Diagnosis not present

## 2022-02-05 DIAGNOSIS — Z20822 Contact with and (suspected) exposure to covid-19: Secondary | ICD-10-CM | POA: Diagnosis not present

## 2022-02-05 DIAGNOSIS — E1165 Type 2 diabetes mellitus with hyperglycemia: Secondary | ICD-10-CM | POA: Diagnosis not present

## 2022-02-09 DIAGNOSIS — Z794 Long term (current) use of insulin: Secondary | ICD-10-CM | POA: Diagnosis not present

## 2022-02-09 DIAGNOSIS — E1165 Type 2 diabetes mellitus with hyperglycemia: Secondary | ICD-10-CM | POA: Diagnosis not present

## 2022-02-09 DIAGNOSIS — E1129 Type 2 diabetes mellitus with other diabetic kidney complication: Secondary | ICD-10-CM | POA: Diagnosis not present

## 2022-02-09 DIAGNOSIS — E1142 Type 2 diabetes mellitus with diabetic polyneuropathy: Secondary | ICD-10-CM | POA: Diagnosis not present

## 2022-02-09 DIAGNOSIS — R809 Proteinuria, unspecified: Secondary | ICD-10-CM | POA: Diagnosis not present

## 2022-02-09 DIAGNOSIS — N1832 Chronic kidney disease, stage 3b: Secondary | ICD-10-CM | POA: Diagnosis not present

## 2022-02-09 DIAGNOSIS — E1159 Type 2 diabetes mellitus with other circulatory complications: Secondary | ICD-10-CM | POA: Diagnosis not present

## 2022-02-09 DIAGNOSIS — E1122 Type 2 diabetes mellitus with diabetic chronic kidney disease: Secondary | ICD-10-CM | POA: Diagnosis not present

## 2022-02-13 NOTE — Procedures (Signed)
[facility] [place], [address]    Complete Pulmonary Function Testing Interpretation:  FINDINGS:  Forced vital capacity is normal the FEV1 is normal.  F1 FVC ratio is normal.  Total lung capacity is moderately decreased.  Residual volume is decreased residual volume total in past ratio is decreased.  FRC is 27% which is decreased.  DLCO was within normal limits.  Postbronchodilator no significant change in FEV1 was noted  IMPRESSION:  This pulmonary function study is suggestive of moderate restrictive lung disease clinical correlation is recommended.  Allyne Gee, MD [facility] Pulmonary Critical Care Medicine Sleep Medicine

## 2022-02-14 ENCOUNTER — Encounter: Payer: Self-pay | Admitting: Internal Medicine

## 2022-02-14 LAB — PULMONARY FUNCTION TEST

## 2022-02-27 DIAGNOSIS — Z20822 Contact with and (suspected) exposure to covid-19: Secondary | ICD-10-CM | POA: Diagnosis not present

## 2022-03-07 DIAGNOSIS — I739 Peripheral vascular disease, unspecified: Secondary | ICD-10-CM | POA: Diagnosis not present

## 2022-03-07 DIAGNOSIS — I10 Essential (primary) hypertension: Secondary | ICD-10-CM | POA: Diagnosis not present

## 2022-03-07 DIAGNOSIS — Z8673 Personal history of transient ischemic attack (TIA), and cerebral infarction without residual deficits: Secondary | ICD-10-CM | POA: Diagnosis not present

## 2022-03-07 DIAGNOSIS — I700 Atherosclerosis of aorta: Secondary | ICD-10-CM | POA: Diagnosis not present

## 2022-03-07 DIAGNOSIS — I2510 Atherosclerotic heart disease of native coronary artery without angina pectoris: Secondary | ICD-10-CM | POA: Diagnosis not present

## 2022-03-07 DIAGNOSIS — I5032 Chronic diastolic (congestive) heart failure: Secondary | ICD-10-CM | POA: Diagnosis not present

## 2022-03-07 DIAGNOSIS — E119 Type 2 diabetes mellitus without complications: Secondary | ICD-10-CM | POA: Diagnosis not present

## 2022-03-07 DIAGNOSIS — N184 Chronic kidney disease, stage 4 (severe): Secondary | ICD-10-CM | POA: Diagnosis not present

## 2022-03-07 DIAGNOSIS — R002 Palpitations: Secondary | ICD-10-CM | POA: Diagnosis not present

## 2022-03-07 DIAGNOSIS — I214 Non-ST elevation (NSTEMI) myocardial infarction: Secondary | ICD-10-CM | POA: Diagnosis not present

## 2022-03-07 DIAGNOSIS — J449 Chronic obstructive pulmonary disease, unspecified: Secondary | ICD-10-CM | POA: Diagnosis not present

## 2022-03-07 DIAGNOSIS — Z955 Presence of coronary angioplasty implant and graft: Secondary | ICD-10-CM | POA: Diagnosis not present

## 2022-03-09 ENCOUNTER — Other Ambulatory Visit: Payer: Self-pay | Admitting: Internal Medicine

## 2022-03-12 ENCOUNTER — Other Ambulatory Visit: Payer: Self-pay | Admitting: *Deleted

## 2022-03-12 MED ORDER — CITALOPRAM HYDROBROMIDE 10 MG PO TABS: 10.0000 mg | ORAL_TABLET | Freq: Every day | ORAL | 3 refills | Status: DC

## 2022-03-15 DIAGNOSIS — Z20822 Contact with and (suspected) exposure to covid-19: Secondary | ICD-10-CM | POA: Diagnosis not present

## 2022-03-21 DIAGNOSIS — Z85828 Personal history of other malignant neoplasm of skin: Secondary | ICD-10-CM | POA: Diagnosis not present

## 2022-03-21 DIAGNOSIS — Z85820 Personal history of malignant melanoma of skin: Secondary | ICD-10-CM | POA: Diagnosis not present

## 2022-03-21 DIAGNOSIS — D2262 Melanocytic nevi of left upper limb, including shoulder: Secondary | ICD-10-CM | POA: Diagnosis not present

## 2022-03-21 DIAGNOSIS — D2272 Melanocytic nevi of left lower limb, including hip: Secondary | ICD-10-CM | POA: Diagnosis not present

## 2022-03-21 DIAGNOSIS — X32XXXA Exposure to sunlight, initial encounter: Secondary | ICD-10-CM | POA: Diagnosis not present

## 2022-03-21 DIAGNOSIS — L570 Actinic keratosis: Secondary | ICD-10-CM | POA: Diagnosis not present

## 2022-03-21 DIAGNOSIS — L821 Other seborrheic keratosis: Secondary | ICD-10-CM | POA: Diagnosis not present

## 2022-03-26 DIAGNOSIS — Z20822 Contact with and (suspected) exposure to covid-19: Secondary | ICD-10-CM | POA: Diagnosis not present

## 2022-03-27 DIAGNOSIS — Z20822 Contact with and (suspected) exposure to covid-19: Secondary | ICD-10-CM | POA: Diagnosis not present

## 2022-04-12 DIAGNOSIS — E8722 Chronic metabolic acidosis: Secondary | ICD-10-CM | POA: Diagnosis not present

## 2022-04-12 DIAGNOSIS — D631 Anemia in chronic kidney disease: Secondary | ICD-10-CM | POA: Diagnosis not present

## 2022-04-12 DIAGNOSIS — E875 Hyperkalemia: Secondary | ICD-10-CM | POA: Diagnosis not present

## 2022-04-12 DIAGNOSIS — E871 Hypo-osmolality and hyponatremia: Secondary | ICD-10-CM | POA: Diagnosis not present

## 2022-04-12 DIAGNOSIS — E1122 Type 2 diabetes mellitus with diabetic chronic kidney disease: Secondary | ICD-10-CM | POA: Diagnosis not present

## 2022-04-12 DIAGNOSIS — N184 Chronic kidney disease, stage 4 (severe): Secondary | ICD-10-CM | POA: Diagnosis not present

## 2022-04-12 DIAGNOSIS — N2581 Secondary hyperparathyroidism of renal origin: Secondary | ICD-10-CM | POA: Diagnosis not present

## 2022-04-12 DIAGNOSIS — I129 Hypertensive chronic kidney disease with stage 1 through stage 4 chronic kidney disease, or unspecified chronic kidney disease: Secondary | ICD-10-CM | POA: Diagnosis not present

## 2022-04-13 DIAGNOSIS — Z20822 Contact with and (suspected) exposure to covid-19: Secondary | ICD-10-CM | POA: Diagnosis not present

## 2022-04-14 DIAGNOSIS — Z20822 Contact with and (suspected) exposure to covid-19: Secondary | ICD-10-CM | POA: Diagnosis not present

## 2022-04-16 ENCOUNTER — Ambulatory Visit (INDEPENDENT_AMBULATORY_CARE_PROVIDER_SITE_OTHER): Payer: Medicare Other | Admitting: Physician Assistant

## 2022-04-16 ENCOUNTER — Encounter: Payer: Self-pay | Admitting: Physician Assistant

## 2022-04-16 VITALS — BP 140/70 | HR 61 | Temp 97.8°F | Resp 16 | Ht 66.0 in | Wt 194.0 lb

## 2022-04-16 DIAGNOSIS — I10 Essential (primary) hypertension: Secondary | ICD-10-CM

## 2022-04-16 DIAGNOSIS — J4520 Mild intermittent asthma, uncomplicated: Secondary | ICD-10-CM

## 2022-04-16 DIAGNOSIS — Z9989 Dependence on other enabling machines and devices: Secondary | ICD-10-CM | POA: Diagnosis not present

## 2022-04-16 DIAGNOSIS — J984 Other disorders of lung: Secondary | ICD-10-CM

## 2022-04-16 DIAGNOSIS — G4733 Obstructive sleep apnea (adult) (pediatric): Secondary | ICD-10-CM | POA: Diagnosis not present

## 2022-04-16 DIAGNOSIS — Z20822 Contact with and (suspected) exposure to covid-19: Secondary | ICD-10-CM | POA: Diagnosis not present

## 2022-04-16 DIAGNOSIS — I5022 Chronic systolic (congestive) heart failure: Secondary | ICD-10-CM

## 2022-04-16 NOTE — Progress Notes (Signed)
[facility] [place], [place] [id number]  Pulmonary Sleep Medicine   Office Visit Note  Patient Name: Mike Decker DOB: [birth year] MRN [id number]  Date of Service: 04/17/2022  Complaints/HPI: Pt is here for routine pulmonary follow up to review PFT.  Reports he had CP later on in the day after PFT was done and nitro stopped it. Saw cardiology and they sent him to [facility] to interventional cardiology. He previously had cath of 50% blockage and elected to not do any intervention. This visit they did echo and stress test and adjusted his meds. Has follow up  in a few weeks. Was started on jardiance, had increased isosorbide, and stopped entresto.  -Does have some intermittent wheezing but none on exam today. Will continue antihistamine and nasal spray and call if progression and ABX may be sent -Pft reviewed and showed: Forced vital capacity is normal the FEV1 is normal.  F1 FVC ratio is normal.  Total lung capacity is moderately decreased.  Residual volume is decreased residual volume total in past ratio is decreased.  FRC is 27% which is decreased.  DLCO was within normal limits.  Postbronchodilator no significant change in FEV1 was noted   IMPRESSION:   This pulmonary function study is suggestive of moderate restrictive lung disease clinical correlation is recommended.  ROS  General: (-) fever, (-) chills, (-) night sweats, (-) weakness Skin: (-) rashes, (-) itching,. Eyes: (-) visual changes, (-) redness, (-) itching. Nose and Sinuses: (-) nasal stuffiness or itchiness, (-) postnasal drip, (-) nosebleeds, (-) sinus trouble. Mouth and Throat: (-) sore throat, (-) hoarseness. Neck: (-) swollen glands, (-) enlarged thyroid, (-) neck pain. Respiratory: - cough, (-) bloody sputum, - shortness of breath, + wheezing. Cardiovascular: - ankle swelling, (-) chest pain. Lymphatic: (-) lymph node enlargement. Neurologic: (-) numbness, (-) tingling. Psychiatric: (-)  anxiety, (-) depression   Current Medication: Outpatient Encounter Medications as of 04/16/2022  Medication Sig   allopurinol (ZYLOPRIM) 100 MG tablet Take 100 mg by mouth daily.    azelastine (ASTELIN) 0.1 % nasal spray USE 1 TO 2 SPRAYS IN EACH NOSTRIL TWICE A DAY   cetirizine (ZYRTEC) 5 MG tablet Take 5 mg by mouth daily as needed for allergies or rhinitis.    citalopram (CELEXA) 10 MG tablet Take 1 tablet (10 mg total) by mouth daily.   clopidogrel (PLAVIX) 75 MG tablet Take 1 tablet by mouth daily.   colchicine 0.6 MG tablet Take 3 tablets (1.8 mg total) by mouth daily as needed (gout flares).   [employer] VITAMIN B12 1000 MCG tablet TAKE 1 TABLET BY MOUTH EVERY DAY   dextromethorphan-guaiFENesin (MUCINEX DM) 30-600 MG 12hr tablet Take 1 tablet by mouth 2 (two) times daily as needed for cough.   EPINEPHrine (EPI-PEN) 0.3 mg/0.3 mL SOAJ injection Inject into the muscle as needed (anaphylaxis).    ferrous sulfate 325 (65 FE) MG EC tablet TAKE 1 TABLET BY MOUTH EVERY DAY   fluticasone (FLONASE) 50 MCG/ACT nasal spray USE 1 SPRAY IN EACH NOSTRIL EVERY DAY   Fluticasone-Umeclidin-Vilant (TRELEGY ELLIPTA) 100-62.5-25 MCG/INH AEPB Inhale 1 puff into the lungs daily.   folic acid (FOLVITE) 222 MCG tablet TAKE 1 TABLET BY MOUTH DAILY.   hydrALAZINE (APRESOLINE) 50 MG tablet TAKE ONE TABLET BY MOUTH TWICE A DAY HOLD DOSE FOR SYSTOLIC BLOOD PRESSURE  LESS THAN 100   insulin aspart (NOVOLOG) 100 UNIT/ML FlexPen 15 UNITS IN AM, 25 UNITS AT LUNCH, 25 UNITS AT DINNER   insulin  glargine (LANTUS) 100 UNIT/ML injection Inject 0.35 mLs (35 Units total) into the skin at bedtime. This is a decrease from your previous 45 units nightly. (Patient taking differently: Inject 60 Units into the skin at bedtime. This is a decrease from your previous 45 units nightly.)   ipratropium-albuterol (DUONEB) 0.5-2.5 (3) MG/3ML SOLN Take 3 mLs by nebulization every 4 (four) hours as needed.   isosorbide mononitrate (IMDUR) 120 MG 24  hr tablet Take 2 tab   JARDIANCE 10 MG TABS tablet Take by mouth.   methocarbamol (ROBAXIN) 500 MG tablet Take 500 mg by mouth 2 (two) times daily.    metoprolol tartrate (LOPRESSOR) 50 MG tablet Take 50 mg by mouth 2 (two) times daily.   montelukast (SINGULAIR) 10 MG tablet TAKE 1 TABLET BY MOUTH EVERYDAY AT BEDTIME   nitroGLYCERIN (NITROSTAT) 0.4 MG SL tablet Place 0.4 mg under the tongue every 5 (five) minutes x 3 doses as needed for chest pain.    Omega-3 Fatty Acids (FISH OIL) 1000 MG CAPS Take 1 capsule by mouth daily.    pantoprazole (PROTONIX) 40 MG tablet Take 1 tablet (40 mg total) by mouth 2 (two) times daily before a meal.   rosuvastatin (CRESTOR) 40 MG tablet Take 40 mg by mouth every evening.    sacubitril-valsartan (ENTRESTO) 24-26 MG Take 1 tablet by mouth 2 (two) times daily.   sodium bicarbonate 650 MG tablet Take 650 mg by mouth 2 (two) times daily.   Torsemide 40 MG TABS Take 40 mg by mouth once daily   Vitamin D, Ergocalciferol, 50000 units CAPS Take 1 capsule by mouth every 30 (thirty) days.   [DISCONTINUED] predniSONE (DELTASONE) 10 MG tablet Take one tab 3 x day for 3 days, then take one tab 2 x a day for 3 days and then take one tab a day for 3 days for copd   No facility-administered encounter medications on file as of 04/16/2022.    Surgical History: Past Surgical History:  Procedure Laterality Date   CATARACT EXTRACTION  2011, 2012   COLONOSCOPY N/A 12/14/2018   Procedure: COLONOSCOPY;  Surgeon: Virgel Manifold, MD;  Location: [facility] ENDOSCOPY;  Service: Endoscopy;  Laterality: N/A;   CORONARY ANGIOPLASTY WITH STENT PLACEMENT  2012   CORONARY STENT INTERVENTION N/A 12/29/2019   Procedure: CORONARY STENT INTERVENTION;  Surgeon: Nelva Bush, MD;  Location: [place] CV LAB;  Service: Cardiovascular;  Laterality: N/A;   ESOPHAGOGASTRODUODENOSCOPY N/A 12/14/2018   Procedure: ESOPHAGOGASTRODUODENOSCOPY (EGD);  Surgeon: Virgel Manifold, MD;  Location: [facility]  ENDOSCOPY;  Service: Endoscopy;  Laterality: N/A;   ESOPHAGOGASTRODUODENOSCOPY (EGD) WITH PROPOFOL N/A 06/01/2016   Procedure: ESOPHAGOGASTRODUODENOSCOPY (EGD) WITH PROPOFOL;  Surgeon: Manya Silvas, MD;  Location: [facility] ENDOSCOPY;  Service: Endoscopy;  Laterality: N/A;   HERNIA REPAIR     x2   INTRAOCULAR LENS INSERTION     LEFT HEART CATH AND CORONARY ANGIOGRAPHY N/A 05/09/2017   Procedure: Left Heart Cath and Coronary Angiography;  Surgeon: Yolonda Kida, MD;  Location: [place] CV LAB;  Service: Cardiovascular;  Laterality: N/A;   LEFT HEART CATH AND CORONARY ANGIOGRAPHY N/A 12/08/2018   Procedure: LEFT HEART CATH AND CORONARY ANGIOGRAPHY and possible PCI and stent;  Surgeon: Yolonda Kida, MD;  Location: [place] CV LAB;  Service: Cardiovascular;  Laterality: N/A;   LEFT HEART CATH AND CORONARY ANGIOGRAPHY N/A 12/29/2019   Procedure: LEFT HEART CATH AND CORONARY ANGIOGRAPHY;  Surgeon: Nelva Bush, MD;  Location: [place] CV LAB;  Service:  Cardiovascular;  Laterality: N/A;   NOSE SURGERY     submucous resection   PROSTATE SURGERY  2002   ROTATOR CUFF REPAIR Right     Medical History: Past Medical History:  Diagnosis Date   Anemia    Arthritis    Atrioventricular canal (AVC)    irregular heart beats   Barrett esophagus    Bronchiolitis    Cancer ([place]) 2002   prostate, esphageal   Chronic diastolic CHF (congestive heart failure) (HCC)    Colon polyp    Diabetes mellitus without complication (HCC)    Diverticulosis    Gout    Heart disease    Hemangioma    liver   Hyperlipidemia    Hypertension    Myocardial infarct ([place])    Ocular hypertension    Peripheral vascular disease (HCC)    Skin cancer    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[place])    Sleep apnea    Vitreoretinal degeneration     Family History: Family History  Problem Relation Age of Onset   Arthritis Mother    Stroke Maternal Grandfather    Breast cancer Neg Hx     Social History: Social  History   Socioeconomic History   Marital status: Married    Spouse name: Not on file   Number of children: 2   Years of education: College 3 years   Highest education level: Associate degree: occupational, Hotel manager, or vocational program  Occupational History   Occupation: retired  Tobacco Use   Smoking status: Former    Years: 20.00    Types: Cigarettes    Quit date: 12/10/1976    Years since quitting: 45.3   Smokeless tobacco: Never  Substance and Sexual Activity   Alcohol use: No   Drug use: No   Sexual activity: Not Currently  Other Topics Concern   Not on file  Social History Narrative   Not on file   Social Determinants of Health   Financial Resource Strain: Low Risk    Difficulty of Paying Living Expenses: Not very hard  Food Insecurity: No Food Insecurity   Worried About Charity fundraiser in the Last Year: Never true   Ran Out of Food in the Last Year: Never true  Transportation Needs: No Transportation Needs   Lack of Transportation (Medical): No   Lack of Transportation (Non-Medical): No  Physical Activity: Insufficiently Active   Days of Exercise per Week: 7 days   Minutes of Exercise per Session: 20 min  Stress: No Stress Concern Present   Feeling of Stress : Not at all  Social Connections: Moderately Isolated   Frequency of Communication with Friends and Family: Once a week   Frequency of Social Gatherings with Friends and Family: Once a week   Attends Religious Services: More than 4 times per year   Active Member of [employer] or Organizations: No   Attends Archivist Meetings: Never   Marital Status: Married  Human resources officer Violence: Not At Risk   Fear of Current or Ex-Partner: No   Emotionally Abused: No   Physically Abused: No   Sexually Abused: No    Vital Signs: Blood pressure 140/70, pulse 61, temperature 97.8 F (36.6 C), resp. rate 16, height '5\' 6"'$  (1.676 m), weight 194 lb (88 kg), SpO2 97 %.  Examination: General Appearance:  The patient is well-developed, well-nourished, and in no distress. Skin: Gross inspection of skin unremarkable. Head: normocephalic, no gross deformities. Eyes: no gross deformities noted. ENT: ears appear  grossly normal no exudates. Neck: Supple. No thyromegaly. No LAD. Respiratory: Lungs clear to auscultation bilaterally. Cardiovascular: Normal S1 and S2 without murmur or rub. Extremities: No cyanosis. pulses are equal. Neurologic: Alert and oriented. No involuntary movements.  LABS: Recent Results (from the past 2160 hour(s))  Pulmonary Function Test     Status: None   Collection Time: 02/14/22  1:24 PM  Result Value Ref Range   FEV1     FVC     FEV1/FVC     TLC     DLCO      Radiology: No results found.  No results found.  No results found.    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[place]) 06/28/2021   Urinary tract infection without hematuria 11/01/2020   Dysuria 11/01/2020   Hyposmolality and/or hyponatremia 09/14/2020   Cough 09/13/2020   Bronchitis 07/15/2020   Palpitations 07/15/2020   Aortic atherosclerosis ([place]) 06/21/2020   Heart failure ([place]) 06/21/2020   Otalgia of both ears 05/27/2020   Lobar pneumonia ([place])    Type 2 diabetes mellitus with stage 4 chronic kidney disease, without long-term current use of insulin ([place]) 12/14/2019   Elevated troponin    Acidosis 10/30/2019   Benign 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 10/30/2019   Hyperkalemia 10/30/2019   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/30/2019   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   Encounter for fitting and adjustment of hearing aid 06/16/2019   Health maintenance alteration 06/16/2019   Prostate cancer ([place]) 06/16/2019   DM type 2 with diabetic peripheral neuropathy ([place]) 01/25/2019   CKD (chronic kidney disease) stage 4, GFR 15-29 ml/min (HCC) 01/21/2019   Gout  01/21/2019   MR (mitral regurgitation) 01/21/2019   Chronic obstructive pulmonary disease ([place]) 01/08/2019   OSA (obstructive sleep apnea) 01/08/2019   Chronic diastolic heart failure ([place]) 12/30/2018   Polyp of sigmoid colon    Benign neoplasm of ascending colon    Hemorrhoids without complication    Diverticulosis of large intestine without diverticulitis    Reflux esophagitis    Lymphangiectasia    Arteriovenous malformation of duodenum    Symptomatic anemia    SOB (shortness of breath)    Iron deficiency anemia due to chronic blood loss    Unstable angina (HCC)    Anemia of chronic renal failure, stage 4 (severe) ([place])    Chest pain 11/14/2018   Sciatica 11/12/2018   CAD (coronary artery disease) 02/27/2018   Sacroiliac joint pain 10/08/2017   Pain in limb 09/24/2017   Neuropathy 07/24/2017   NSTEMI (non-ST elevated myocardial infarction) ([place]) 05/06/2017   PAD (peripheral artery disease) ([place]) 05/01/2017   History of stroke 01/02/2017   Barrett's esophagus 10/02/2016   Carotid stenosis 10/02/2016   Essential hypertension 10/02/2016   Hyperlipidemia 10/02/2016   Diabetes ([place]) 10/02/2016   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 Restrictive lung disease PFT shows some progression to moderate restrictive lung disease. Will continue to monitor  2. Chronic asthma, mild intermittent, uncomplicated Continue inhalers as prescribed  3. Chronic systolic congestive heart failure ([place]) Followed by cardiology  4. Essential hypertension Well controlled, continue current medications  5. OSA on CPAP Continue cpap nightly   General Counseling: I have discussed the findings of the  evaluation and examination with Marland Kitchen.  I have also discussed any further diagnostic evaluation thatmay be  needed or ordered today. [place] verbalizes understanding of the findings of todays visit. We also reviewed his medications today and discussed drug interactions and side effects including but not limited excessive drowsiness and altered mental states. We also discussed that there is always a risk not just to him but also people around him. he has been encouraged to call the office with any questions or concerns that should arise related to todays visit.  No orders of the defined types were placed in this encounter.    Time spent: 30  I have personally obtained a history, examined the patient, evaluated laboratory and imaging results, formulated the assessment and plan and placed orders. This patient was seen by Drema Dallas, PA-C in collaboration with Dr. Devona Konig as a part of collaborative care agreement.     Allyne Gee, MD [facility] Pulmonary and Critical Care Sleep medicine

## 2022-04-17 NOTE — Patient Instructions (Signed)
Asthma, Adult ? ?Asthma is a long-term (chronic) condition that causes recurrent episodes in which the lower airways in the lungs become tight and narrow. The narrowing is caused by inflammation and tightening of the smooth muscle around the lower airways. ?Asthma episodes, also called asthma attacks or asthma flares, may cause coughing, making high-pitched whistling sounds when you breathe, most often when you breathe out (wheezing), shortness of breath, and chest pain. The airways may produce extra mucus caused by the inflammation and irritation. During an attack, it can be difficult to breathe. Asthma attacks can range from minor to life-threatening. ?Asthma cannot be cured, but medicines and lifestyle changes can help control it and treat acute attacks. It is important to keep your asthma well controlled so the condition does not interfere with your daily life. ?What are the causes? ?This condition is believed to be caused by inherited (genetic) and environmental factors, but its exact cause is not known. ?What can trigger an asthma attack? ?Many things can bring on an asthma attack or make symptoms worse. These triggers are different for every person. Common triggers include: ?Allergens and irritants like mold, dust, pet dander, cockroaches, pollen, air pollution, and chemical odors. ?Cigarette smoke. ?Weather changes and cold air. ?Stress and strong emotional responses such as crying or laughing hard. ?Certain medications such as aspirin or beta blockers. ?Infections and inflammatory conditions, such as the flu, a cold, pneumonia, or inflammation of the nasal membranes (rhinitis). ?Gastroesophageal reflux disease (GERD). ?What are the signs or symptoms? ?Symptoms may occur right after exposure to an asthma trigger or hours later and can vary by person. Common signs and symptoms include: ?Wheezing. ?Trouble breathing (shortness of breath). ?Excessive nighttime or early morning coughing. ?Chest  tightness. ?Tiredness (fatigue) with minimal activity. ?Difficulty talking in complete sentences. ?Poor exercise tolerance. ?How is this diagnosed? ?This condition is diagnosed based on: ?A physical exam and your medical history. ?Tests, which may include: ?Lung function studies to evaluate the flow of air in your lungs. ?Allergy tests. ?Imaging tests, such as X-rays. ?How is this treated? ?There is no cure, but symptoms can be controlled with proper treatment. Treatment usually involves: ?Identifying and avoiding your asthma triggers. ?Inhaled medicines. Two types are commonly used to treat asthma, depending on severity: ?Controller medicines. These help prevent asthma symptoms from occurring. They are taken every day. ?Fast-acting reliever or rescue medicines. These quickly relieve asthma symptoms. They are used as needed and provide short-term relief. ?Using other medicines, such as: ?Allergy medicines, such as antihistamines, if your asthma attacks are triggered by allergens. ?Immune medicines (immunomodulators). These are medicines that help control the immune system. ?Using supplemental oxygen. This is only needed during a severe episode. ?Creating an asthma action plan. An asthma action plan is a written plan for managing and treating your asthma attacks. This plan includes: ?A list of your asthma triggers and how to avoid them. ?Information about when medicines should be taken and when their dosage should be changed. ?Instructions about using a device called a peak flow meter. A peak flow meter measures how well the lungs are working and the severity of your asthma. It helps you monitor your condition. ?Follow these instructions at home: ?Take over-the-counter and prescription medicines only as told by your health care provider. ?Stay up to date on all vaccinations as recommended by your healthcare provider, including vaccines for the flu and pneumonia. ?Use a peak flow meter and keep track of your peak flow  readings. ?Understand and use your asthma   action plan to address any asthma flares. ?Do not smoke or allow anyone to smoke in your home. ?Contact a health care provider if: ?You have wheezing, shortness of breath, or a cough that is not responding to medicines. ?Your medicines are causing side effects, such as a rash, itching, swelling, or trouble breathing. ?You need to use a reliever medicine more than 2-3 times a week. ?Your peak flow reading is still at 50-79% of your personal best after following your action plan for 1 hour. ?You have a fever and shortness of breath. ?Get help right away if: ?You are getting worse and do not respond to treatment during an asthma attack. ?You are short of breath when at rest or when doing very little physical activity. ?You have difficulty eating, drinking, or talking. ?You have chest pain or tightness. ?You develop a fast heartbeat or palpitations. ?You have a bluish color to your lips or fingernails. ?You are light-headed or dizzy, or you faint. ?Your peak flow reading is less than 50% of your personal best. ?You feel too tired to breathe normally. ?These symptoms may be an emergency. Get help right away. Call 911. ?Do not wait to see if the symptoms will go away. ?Do not drive yourself to the hospital. ?Summary ?Asthma is a long-term (chronic) condition that causes recurrent episodes in which the airways become tight and narrow. Asthma episodes, also called asthma attacks or asthma flares, can cause coughing, wheezing, shortness of breath, and chest pain. ?Asthma cannot be cured, but medicines and lifestyle changes can help keep it well controlled and prevent asthma flares. ?Make sure you understand how to avoid triggers and how and when to use your medicines. ?Asthma attacks can range from minor to life-threatening. Get help right away if you have an asthma attack and do not respond to treatment with your usual rescue medicines. ?This information is not intended to replace  advice given to you by your health care provider. Make sure you discuss any questions you have with your health care provider. ?Document Revised: 09/13/2021 Document Reviewed: 09/04/2021 ?Elsevier Patient Education ? 2023 [employer] Inc[employer] ? ?

## 2022-04-18 DIAGNOSIS — I10 Essential (primary) hypertension: Secondary | ICD-10-CM | POA: Diagnosis not present

## 2022-04-18 DIAGNOSIS — E1122 Type 2 diabetes mellitus with diabetic chronic kidney disease: Secondary | ICD-10-CM | POA: Diagnosis not present

## 2022-04-18 DIAGNOSIS — N2581 Secondary hyperparathyroidism of renal origin: Secondary | ICD-10-CM | POA: Diagnosis not present

## 2022-04-18 DIAGNOSIS — N184 Chronic kidney disease, stage 4 (severe): Secondary | ICD-10-CM | POA: Diagnosis not present

## 2022-04-26 DIAGNOSIS — Z87891 Personal history of nicotine dependence: Secondary | ICD-10-CM | POA: Diagnosis not present

## 2022-04-26 DIAGNOSIS — I130 Hypertensive heart and chronic kidney disease with heart failure and stage 1 through stage 4 chronic kidney disease, or unspecified chronic kidney disease: Secondary | ICD-10-CM | POA: Diagnosis not present

## 2022-04-26 DIAGNOSIS — Z794 Long term (current) use of insulin: Secondary | ICD-10-CM | POA: Diagnosis not present

## 2022-04-26 DIAGNOSIS — R079 Chest pain, unspecified: Secondary | ICD-10-CM | POA: Diagnosis not present

## 2022-04-26 DIAGNOSIS — I5030 Unspecified diastolic (congestive) heart failure: Secondary | ICD-10-CM | POA: Diagnosis not present

## 2022-04-26 DIAGNOSIS — E1122 Type 2 diabetes mellitus with diabetic chronic kidney disease: Secondary | ICD-10-CM | POA: Diagnosis not present

## 2022-04-26 DIAGNOSIS — N184 Chronic kidney disease, stage 4 (severe): Secondary | ICD-10-CM | POA: Diagnosis not present

## 2022-05-09 ENCOUNTER — Other Ambulatory Visit: Payer: Self-pay | Admitting: *Deleted

## 2022-05-09 MED ORDER — CLOPIDOGREL BISULFATE 75 MG PO TABS: 75.0000 mg | ORAL_TABLET | Freq: Every day | ORAL | 0 refills | Status: DC

## 2022-05-28 DIAGNOSIS — E1165 Type 2 diabetes mellitus with hyperglycemia: Secondary | ICD-10-CM | POA: Diagnosis not present

## 2022-05-31 DIAGNOSIS — E1159 Type 2 diabetes mellitus with other circulatory complications: Secondary | ICD-10-CM | POA: Diagnosis not present

## 2022-05-31 DIAGNOSIS — E1165 Type 2 diabetes mellitus with hyperglycemia: Secondary | ICD-10-CM | POA: Diagnosis not present

## 2022-05-31 DIAGNOSIS — N1832 Chronic kidney disease, stage 3b: Secondary | ICD-10-CM | POA: Diagnosis not present

## 2022-05-31 DIAGNOSIS — E1122 Type 2 diabetes mellitus with diabetic chronic kidney disease: Secondary | ICD-10-CM | POA: Diagnosis not present

## 2022-05-31 DIAGNOSIS — E1142 Type 2 diabetes mellitus with diabetic polyneuropathy: Secondary | ICD-10-CM | POA: Diagnosis not present

## 2022-05-31 DIAGNOSIS — Z794 Long term (current) use of insulin: Secondary | ICD-10-CM | POA: Diagnosis not present

## 2022-06-01 ENCOUNTER — Other Ambulatory Visit: Payer: Self-pay | Admitting: Internal Medicine

## 2022-06-07 DIAGNOSIS — I700 Atherosclerosis of aorta: Secondary | ICD-10-CM | POA: Diagnosis not present

## 2022-06-07 DIAGNOSIS — I739 Peripheral vascular disease, unspecified: Secondary | ICD-10-CM | POA: Diagnosis not present

## 2022-06-07 DIAGNOSIS — I5032 Chronic diastolic (congestive) heart failure: Secondary | ICD-10-CM | POA: Diagnosis not present

## 2022-06-07 DIAGNOSIS — I6523 Occlusion and stenosis of bilateral carotid arteries: Secondary | ICD-10-CM | POA: Diagnosis not present

## 2022-06-07 DIAGNOSIS — E119 Type 2 diabetes mellitus without complications: Secondary | ICD-10-CM | POA: Diagnosis not present

## 2022-06-07 DIAGNOSIS — I2510 Atherosclerotic heart disease of native coronary artery without angina pectoris: Secondary | ICD-10-CM | POA: Diagnosis not present

## 2022-06-07 DIAGNOSIS — I214 Non-ST elevation (NSTEMI) myocardial infarction: Secondary | ICD-10-CM | POA: Diagnosis not present

## 2022-06-07 DIAGNOSIS — Z8673 Personal history of transient ischemic attack (TIA), and cerebral infarction without residual deficits: Secondary | ICD-10-CM | POA: Diagnosis not present

## 2022-06-07 DIAGNOSIS — J449 Chronic obstructive pulmonary disease, unspecified: Secondary | ICD-10-CM | POA: Diagnosis not present

## 2022-06-07 DIAGNOSIS — Z955 Presence of coronary angioplasty implant and graft: Secondary | ICD-10-CM | POA: Diagnosis not present

## 2022-06-07 DIAGNOSIS — N184 Chronic kidney disease, stage 4 (severe): Secondary | ICD-10-CM | POA: Diagnosis not present

## 2022-06-07 DIAGNOSIS — R002 Palpitations: Secondary | ICD-10-CM | POA: Diagnosis not present

## 2022-06-08 DIAGNOSIS — R002 Palpitations: Secondary | ICD-10-CM | POA: Diagnosis not present

## 2022-06-25 ENCOUNTER — Other Ambulatory Visit (INDEPENDENT_AMBULATORY_CARE_PROVIDER_SITE_OTHER): Payer: Self-pay | Admitting: Vascular Surgery

## 2022-06-25 DIAGNOSIS — I6523 Occlusion and stenosis of bilateral carotid arteries: Secondary | ICD-10-CM

## 2022-06-25 DIAGNOSIS — I739 Peripheral vascular disease, unspecified: Secondary | ICD-10-CM

## 2022-06-26 ENCOUNTER — Encounter (INDEPENDENT_AMBULATORY_CARE_PROVIDER_SITE_OTHER): Payer: Self-pay | Admitting: Vascular Surgery

## 2022-06-26 ENCOUNTER — Ambulatory Visit (INDEPENDENT_AMBULATORY_CARE_PROVIDER_SITE_OTHER): Payer: Medicare Other

## 2022-06-26 ENCOUNTER — Ambulatory Visit (INDEPENDENT_AMBULATORY_CARE_PROVIDER_SITE_OTHER): Payer: Medicare Other | Admitting: Vascular Surgery

## 2022-06-26 VITALS — BP 125/66 | HR 66 | Resp 16 | Ht 66.0 in | Wt 193.0 lb

## 2022-06-26 DIAGNOSIS — I6523 Occlusion and stenosis of bilateral carotid arteries: Secondary | ICD-10-CM | POA: Diagnosis not present

## 2022-06-26 DIAGNOSIS — E1122 Type 2 diabetes mellitus with diabetic chronic kidney disease: Secondary | ICD-10-CM | POA: Diagnosis not present

## 2022-06-26 DIAGNOSIS — I739 Peripheral vascular disease, unspecified: Secondary | ICD-10-CM | POA: Diagnosis not present

## 2022-06-26 DIAGNOSIS — E785 Hyperlipidemia, unspecified: Secondary | ICD-10-CM | POA: Diagnosis not present

## 2022-06-26 DIAGNOSIS — D649 Anemia, unspecified: Secondary | ICD-10-CM

## 2022-06-26 DIAGNOSIS — N184 Chronic kidney disease, stage 4 (severe): Secondary | ICD-10-CM

## 2022-06-26 DIAGNOSIS — I10 Essential (primary) hypertension: Secondary | ICD-10-CM

## 2022-06-26 NOTE — Assessment & Plan Note (Signed)
ABIs today are 1.20 on the right and 0.97 on the left with multiphasic waveforms.  Perfusion remained stable.  No role for intervention without severe symptoms.  Recheck in 1 year.

## 2022-06-26 NOTE — Progress Notes (Signed)
MRN : [id number]  Mike Decker is a 81 y.o. (1941/07/17) male who presents with chief complaint of No chief complaint on file. Marland Kitchen  History of Present Illness: Patient returns in follow-up of multiple vascular issues.  He is having issues with aortic valve stenosis and apparently needs a transcatheter aortic valve.  He has a known history of carotid stenosis which we have followed for many years.  He has had some dizziness but no focal neurologic symptoms.  He has had some progression of his right carotid stenosis now in the 40 to 59% range and stable 1 to 39% left ICA stenosis. He is also followed for his PAD.  No lifestyle limiting claudication, rest pain, or ulceration.  No major changes in his symptoms since last year. ABIs today are 1.20 on the right and 0.97 on the left with multiphasic waveforms.  Current Outpatient Medications  Medication Sig Dispense Refill   acetaminophen (TYLENOL) 500 MG tablet Take by mouth.     aspirin EC 81 MG tablet Take by mouth.     azelastine (ASTELIN) 0.1 % nasal spray USE 1 TO 2 SPRAYS IN EACH NOSTRIL TWICE A DAY     calcitRIOL (ROCALTROL) 0.25 MCG capsule Take by mouth.     cetirizine (ZYRTEC) 5 MG tablet Take 5 mg by mouth daily as needed for allergies or rhinitis.      citalopram (CELEXA) 10 MG tablet Take 1 tablet (10 mg total) by mouth daily. 90 tablet 3   clopidogrel (PLAVIX) 75 MG tablet TAKE 1 TABLET BY MOUTH EVERY DAY 30 tablet 0   colchicine 0.6 MG tablet Take 3 tablets (1.8 mg total) by mouth daily as needed (gout flares).     [employer] VITAMIN B12 1000 MCG tablet TAKE 1 TABLET BY MOUTH EVERY DAY 30 tablet 0   Cyanocobalamin 1000 MCG SUBL Take by mouth.     EPINEPHrine (EPI-PEN) 0.3 mg/0.3 mL SOAJ injection Inject into the muscle as needed (anaphylaxis).      ferrous sulfate 325 (65 FE) MG EC tablet TAKE 1 TABLET BY MOUTH EVERY DAY 90 tablet 3   fluticasone (FLONASE) 50 MCG/ACT nasal spray USE 1 SPRAY IN EACH NOSTRIL EVERY DAY 32 g 6    Fluticasone-Umeclidin-Vilant (TRELEGY ELLIPTA) 100-62.5-25 MCG/INH AEPB Inhale 1 puff into the lungs daily. 3 each 4   folic acid (FOLVITE) 732 MCG tablet TAKE 1 TABLET BY MOUTH DAILY. 100 tablet 3   hydrALAZINE (APRESOLINE) 50 MG tablet TAKE ONE TABLET BY MOUTH TWICE A DAY HOLD DOSE FOR SYSTOLIC BLOOD PRESSURE  LESS THAN 100     insulin aspart (NOVOLOG) 100 UNIT/ML FlexPen 15 UNITS IN AM, 25 UNITS AT LUNCH, 25 UNITS AT DINNER     insulin glargine (LANTUS) 100 UNIT/ML injection Inject 0.35 mLs (35 Units total) into the skin at bedtime. This is a decrease from your previous 45 units nightly. (Patient taking differently: Inject 60 Units into the skin at bedtime. This is a decrease from your previous 45 units nightly.) 10 mL 11   isosorbide mononitrate (IMDUR) 120 MG 24 hr tablet Take 2 tab     JARDIANCE 10 MG TABS tablet Take by mouth.     magnesium oxide (MAG-OX) 400 (240 Mg) MG tablet Take 1 tablet by mouth daily.     methocarbamol (ROBAXIN) 500 MG tablet Take 500 mg by mouth 2 (two) times daily.   0   metoprolol tartrate (LOPRESSOR) 50 MG tablet Take 50 mg by mouth 2 (two) times  daily.     montelukast (SINGULAIR) 10 MG tablet TAKE 1 TABLET BY MOUTH EVERYDAY AT BEDTIME 90 tablet 1   nitroGLYCERIN (NITROSTAT) 0.4 MG SL tablet Place 0.4 mg under the tongue every 5 (five) minutes x 3 doses as needed for chest pain.      pantoprazole (PROTONIX) 40 MG tablet Take 1 tablet (40 mg total) by mouth 2 (two) times daily before a meal.     ranolazine (RANEXA) 500 MG 12 hr tablet Take 500 mg by mouth 2 (two) times daily.     rosuvastatin (CRESTOR) 40 MG tablet Take 40 mg by mouth every evening.      sodium bicarbonate 650 MG tablet Take 650 mg by mouth 2 (two) times daily.     sodium fluoride (DENTA 5000 PLUS) 1.1 % CREA dental cream See admin instructions.     Torsemide 60 MG TABS Take by mouth.     Vitamin D, Ergocalciferol, 50000 units CAPS Take 1 capsule by mouth every 30 (thirty) days.     allopurinol  (ZYLOPRIM) 100 MG tablet Take 100 mg by mouth daily.  (Patient not taking: Reported on 06/26/2022)     dextromethorphan-guaiFENesin (MUCINEX DM) 30-600 MG 12hr tablet Take 1 tablet by mouth 2 (two) times daily as needed for cough. (Patient not taking: Reported on 06/26/2022)     empagliflozin (JARDIANCE) 10 MG TABS tablet Take 1 tablet by mouth daily. (Patient not taking: Reported on 06/26/2022)     Omega-3 Fatty Acids (FISH OIL) 1000 MG CAPS Take 1 capsule by mouth daily.  (Patient not taking: Reported on 06/26/2022)     sacubitril-valsartan (ENTRESTO) 24-26 MG Take 1 tablet by mouth 2 (two) times daily. (Patient not taking: Reported on 06/26/2022) 180 tablet 3   Torsemide 40 MG TABS Take 40 mg by mouth once daily (Patient not taking: Reported on 06/26/2022)     No current facility-administered medications for this visit.    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[place])    Sleep apnea    Vitreoretinal degeneration     Past Surgical History:  Procedure Laterality Date   CATARACT EXTRACTION  2011, 2012   COLONOSCOPY N/A 12/14/2018   Procedure: COLONOSCOPY;  Surgeon: Virgel Manifold, MD;  Location: [facility] ENDOSCOPY;  Service: Endoscopy;  Laterality: N/A;   CORONARY ANGIOPLASTY WITH STENT PLACEMENT  2012   CORONARY STENT INTERVENTION N/A 12/29/2019   Procedure: CORONARY STENT INTERVENTION;  Surgeon: Nelva Bush, MD;  Location: [place] [facility] LAB;  Service: Cardiovascular;  Laterality:  N/A;   ESOPHAGOGASTRODUODENOSCOPY N/A 12/14/2018   Procedure: ESOPHAGOGASTRODUODENOSCOPY (EGD);  Surgeon: Virgel Manifold, MD;  Location: [facility] ENDOSCOPY;  Service: Endoscopy;  Laterality: N/A;   ESOPHAGOGASTRODUODENOSCOPY (EGD) WITH PROPOFOL N/A 06/01/2016   Procedure: ESOPHAGOGASTRODUODENOSCOPY (EGD) WITH PROPOFOL;  Surgeon: Manya Silvas, MD;  Location: [place];  Service: Endoscopy;  Laterality: N/A;   HERNIA REPAIR     x2   INTRAOCULAR LENS INSERTION     LEFT HEART CATH AND CORONARY ANGIOGRAPHY N/A 05/09/2017   Procedure: Left Heart Cath and Coronary Angiography;  Surgeon: Yolonda Kida, MD;  Location: [place] [facility];  Service: Cardiovascular;  Laterality: N/A;   LEFT HEART CATH AND CORONARY ANGIOGRAPHY N/A 12/08/2018   Procedure: LEFT HEART CATH AND CORONARY ANGIOGRAPHY and possible PCI and stent;  Surgeon: Yolonda Kida, MD;  Location: [place] [facility];  Service: Cardiovascular;  Laterality: N/A;   LEFT HEART CATH AND CORONARY ANGIOGRAPHY N/A 12/29/2019   Procedure: LEFT HEART CATH AND CORONARY ANGIOGRAPHY;  Surgeon: Nelva Bush, MD;  Location: [place] [facility] LAB;  Service: Cardiovascular;  Laterality: N/A;   NOSE SURGERY     submucous resection   PROSTATE SURGERY  2002   ROTATOR CUFF REPAIR Right      Social History   Tobacco Use   Smoking status: Former    Years: 20.00    Types: Cigarettes    Quit date: 12/10/1976    Years since quitting: 45.5   Smokeless tobacco: Never  Substance Use Topics   Alcohol use: No   Drug use: No      Family History  Problem Relation Age of Onset   Arthritis Mother    Stroke Maternal Grandfather    Breast cancer Neg Hx      Allergies  Allergen Reactions   Gabapentin     Other reaction(s): Other (See Comments) Tremors   Peanut-Containing Drug Products Anaphylaxis   Penicillins Hives and Rash    Has patient had a PCN reaction causing immediate rash, facial/tongue/throat swelling, SOB or  lightheadedness with hypotension: Yes Has patient had a PCN reaction causing severe rash involving mucus membranes or skin necrosis: No Has patient had a PCN reaction that required hospitalization No Has patient had a PCN reaction occurring within the last 10 years: No If all of the above answers are "NO", then may proceed with Cephalosporin use.   Bee Venom Swelling   Influenza Vaccines Hives   Inh [Isoniazid] Hives   Jardiance [Empagliflozin] Other (See Comments)    Diarrhea   Kenalog [Triamcinolone Acetonide] Hives   Levaquin [Levofloxacin] Other (See Comments)    Tendon, ligament pain.    Lisinopril     Other reaction(s): Hyperkalemia   Nalfon [Fenoprofen Calcium] Hives   Naproxen    Peanut Oil    Nsaids Rash    Nalfon 600 Nalfon 600    REVIEW OF SYSTEMS (Negative unless checked)   Constitutional: '[]'$ Weight loss  '[]'$ Fever  '[]'$ Chills Cardiac: '[]'$ Chest pain   '[]'$ Chest pressure   '[]'$ Palpitations   '[]'$ Shortness of breath when laying flat   '[x]'$ Shortness of breath at rest   '[x]'$ Shortness of breath with exertion. Vascular:  '[x]'$ Pain in legs with walking   '[x]'$ Pain in legs at rest   '[]'$ Pain in legs when laying flat   '[x]'$ Claudication   '[]'$ Pain in feet when walking  '[]'$ Pain in feet at rest  '[]'$ Pain in feet when laying flat   '[]'$ History of DVT   '[]'$ Phlebitis   '[]'$ Swelling in legs   '[]'$ Varicose veins   '[]'$ Non-healing ulcers Pulmonary:   '[]'$ Uses home oxygen   '[]'$ Productive cough   '[]'$ Hemoptysis   '[]'$ Wheeze  '[]'$ COPD   '[]'$ Asthma Neurologic:  '[]'$ Dizziness  '[]'$ Blackouts   '[]'$ Seizures   '[]'$ History of stroke   '[x]'$ History of TIA  '[]'$ Aphasia   '[]'$ Temporary blindness   '[]'$ Dysphagia   '[]'$   Weakness or numbness in arms   '[x]'$ Weakness or numbness in legs Musculoskeletal:  '[x]'$ Arthritis   '[]'$ Joint swelling   '[]'$ Joint pain   '[]'$ Low back pain Hematologic:  '[]'$ Easy bruising  '[]'$ Easy bleeding   '[]'$ Hypercoagulable state   '[]'$ Anemic  '[]'$ Hepatitis Gastrointestinal:  '[]'$ Blood in stool   '[]'$ Vomiting blood  '[x]'$ Gastroesophageal reflux/heartburn   '[x]'$ Difficulty  swallowing. Genitourinary:  '[]'$ Chronic kidney disease   '[]'$ Difficult urination  '[]'$ Frequent urination  '[]'$ Burning with urination   '[]'$ Blood in urine Skin:  '[]'$ Rashes   '[]'$ Ulcers   '[]'$ Wounds Psychological:  '[]'$ History of anxiety   '[]'$  History of major depression.  Physical Examination  Vitals:   06/26/22 1436  BP: 125/66  Pulse: 66  Resp: 16  Weight: 193 lb (87.5 kg)  Height: '5\' 6"'$  (1.676 m)   Body mass index is 31.15 kg/m. Gen:  WD/WN, NAD Head: [place]/AT, No temporalis wasting. Ear/Nose/Throat: Hearing grossly intact, nares w/o erythema or drainage, trachea midline Eyes: Conjunctiva clear. Sclera non-icteric Neck: Supple.  No bruit  Pulmonary:  Good air movement, equal and clear to auscultation bilaterally.  Cardiac: RRR, No JVD Vascular:  Vessel Right Left  Radial Palpable Palpable  DP 2+ 2+  PT 2+ 1+   Musculoskeletal: M/S 5/5 throughout.  No deformity or atrophy. No edema. Neurologic: CN 2-12 intact. Sensation grossly intact in extremities.  Symmetrical.  Speech is fluent. Motor exam as listed above. Psychiatric: Judgment intact, Mood & affect appropriate for pt's clinical situation. Dermatologic: No rashes or ulcers noted.  No cellulitis or open wounds.     CBC Lab Results  Component Value Date   WBC 9.0 12/25/2021   HGB 12.2 (L) 12/25/2021   HCT 36.1 (L) 12/25/2021   MCV 95.5 12/25/2021   PLT 218 12/25/2021    BMET    Component Value Date/Time   NA 133 (L) 12/25/2021 1443   NA 139 11/03/2014 0711   K 4.6 12/25/2021 1443   K 4.6 11/03/2014 0711   CL 97 (L) 12/25/2021 1443   CL 104 11/03/2014 0711   CO2 24 12/25/2021 1443   CO2 28 11/03/2014 0711   GLUCOSE 272 (H) 12/25/2021 1443   GLUCOSE 234 (H) 11/03/2014 0711   BUN 55 (H) 12/25/2021 1443   BUN 43 (H) 11/03/2014 0711   CREATININE 2.04 (H) 12/25/2021 1443   CREATININE 2.35 (H) 06/28/2021 1013   CALCIUM 8.8 (L) 12/25/2021 1443   CALCIUM 8.9 11/03/2014 0711   GFRNONAA 32 (L) 12/25/2021 1443   GFRNONAA 43 (L)  11/03/2014 0711   GFRAA 38 (L) 04/18/2020 0303   GFRAA 52 (L) 11/03/2014 0711   CrCl cannot be calculated (Patient's most recent lab result is older than the maximum 21 days allowed.).  COAG Lab Results  Component Value Date   INR 1.0 12/29/2019   INR 0.9 12/14/2019   INR 0.98 12/05/2018    Radiology No results found.   Assessment/Plan Diabetes (HCC) blood glucose control important in reducing the progression of atherosclerotic disease. Also, involved in wound healing. On appropriate medications.     Essential hypertension blood pressure control important in reducing the progression of atherosclerotic disease. On appropriate oral medications.     Hyperlipidemia lipid control important in reducing the progression of atherosclerotic disease. Continue statin therapy   Symptomatic anemia Follows with hematology.    Carotid stenosis He has had some progression of his right carotid stenosis now in the 40 to 59% range and stable 1 to 39% left ICA stenosis. No role for intervention at this level.  Continue follow-up on an annual basis.  Continue current medical regimen.  PAD (peripheral artery disease) (HCC) ABIs today are 1.20 on the right and 0.97 on the left with multiphasic waveforms.  Perfusion remained stable.  No role for intervention without severe symptoms.  Recheck in 1 year.    Leotis Pain, MD  06/26/2022 5:00 PM    This note was created with Dragon medical transcription system.  Any errors from dictation are purely unintentional

## 2022-06-26 NOTE — Assessment & Plan Note (Signed)
He has had some progression of his right carotid stenosis now in the 40 to 59% range and stable 1 to 39% left ICA stenosis. No role for intervention at this level.  Continue follow-up on an annual basis.  Continue current medical regimen.

## 2022-06-28 ENCOUNTER — Other Ambulatory Visit: Payer: Self-pay | Admitting: Internal Medicine

## 2022-07-02 ENCOUNTER — Other Ambulatory Visit: Payer: Self-pay | Admitting: Internal Medicine

## 2022-07-18 ENCOUNTER — Other Ambulatory Visit: Payer: Self-pay | Admitting: Internal Medicine

## 2022-08-01 DIAGNOSIS — E119 Type 2 diabetes mellitus without complications: Secondary | ICD-10-CM | POA: Diagnosis not present

## 2022-08-01 LAB — HM DIABETES EYE EXAM

## 2022-08-14 ENCOUNTER — Encounter: Payer: Self-pay | Admitting: *Deleted

## 2022-08-15 ENCOUNTER — Observation Stay
Admit: 2022-08-15 | Discharge: 2022-08-15 | Disposition: A | Discharge status: Home / Self Care | Payer: Medicare Other | Attending: Cardiology | Admitting: Cardiology

## 2022-08-15 ENCOUNTER — Encounter: Payer: Self-pay | Admitting: Emergency Medicine

## 2022-08-15 ENCOUNTER — Emergency Department: Payer: Medicare Other

## 2022-08-15 ENCOUNTER — Inpatient Hospital Stay
Admission: EM | Admit: 2022-08-15 | Discharge: 2022-08-17 | DRG: 303 | Disposition: A | Discharge status: Home / Self Care | Payer: Medicare Other | Attending: Internal Medicine | Admitting: Internal Medicine

## 2022-08-15 DIAGNOSIS — I248 Other forms of acute ischemic heart disease: Secondary | ICD-10-CM | POA: Diagnosis present

## 2022-08-15 DIAGNOSIS — E1129 Type 2 diabetes mellitus with other diabetic kidney complication: Secondary | ICD-10-CM | POA: Diagnosis present

## 2022-08-15 DIAGNOSIS — E1151 Type 2 diabetes mellitus with diabetic peripheral angiopathy without gangrene: Secondary | ICD-10-CM | POA: Diagnosis present

## 2022-08-15 DIAGNOSIS — Z87891 Personal history of nicotine dependence: Secondary | ICD-10-CM

## 2022-08-15 DIAGNOSIS — M109 Gout, unspecified: Secondary | ICD-10-CM | POA: Diagnosis present

## 2022-08-15 DIAGNOSIS — I5032 Chronic diastolic (congestive) heart failure: Secondary | ICD-10-CM | POA: Diagnosis present

## 2022-08-15 DIAGNOSIS — I130 Hypertensive heart and chronic kidney disease with heart failure and stage 1 through stage 4 chronic kidney disease, or unspecified chronic kidney disease: Secondary | ICD-10-CM | POA: Diagnosis present

## 2022-08-15 DIAGNOSIS — D631 Anemia in chronic kidney disease: Secondary | ICD-10-CM | POA: Diagnosis not present

## 2022-08-15 DIAGNOSIS — I350 Nonrheumatic aortic (valve) stenosis: Secondary | ICD-10-CM | POA: Diagnosis present

## 2022-08-15 DIAGNOSIS — N184 Chronic kidney disease, stage 4 (severe): Secondary | ICD-10-CM | POA: Diagnosis present

## 2022-08-15 DIAGNOSIS — F32A Depression, unspecified: Secondary | ICD-10-CM | POA: Diagnosis present

## 2022-08-15 DIAGNOSIS — I214 Non-ST elevation (NSTEMI) myocardial infarction: Secondary | ICD-10-CM | POA: Diagnosis not present

## 2022-08-15 DIAGNOSIS — Z888 Allergy status to other drugs, medicaments and biological substances status: Secondary | ICD-10-CM | POA: Diagnosis not present

## 2022-08-15 DIAGNOSIS — Z91010 Allergy to peanuts: Secondary | ICD-10-CM

## 2022-08-15 DIAGNOSIS — Z881 Allergy status to other antibiotic agents status: Secondary | ICD-10-CM

## 2022-08-15 DIAGNOSIS — I2510 Atherosclerotic heart disease of native coronary artery without angina pectoris: Secondary | ICD-10-CM | POA: Diagnosis present

## 2022-08-15 DIAGNOSIS — Z886 Allergy status to analgesic agent status: Secondary | ICD-10-CM

## 2022-08-15 DIAGNOSIS — Z8673 Personal history of transient ischemic attack (TIA), and cerebral infarction without residual deficits: Secondary | ICD-10-CM

## 2022-08-15 DIAGNOSIS — E8881 Metabolic syndrome: Secondary | ICD-10-CM | POA: Diagnosis present

## 2022-08-15 DIAGNOSIS — Z8546 Personal history of malignant neoplasm of prostate: Secondary | ICD-10-CM

## 2022-08-15 DIAGNOSIS — Z7902 Long term (current) use of antithrombotics/antiplatelets: Secondary | ICD-10-CM

## 2022-08-15 DIAGNOSIS — R079 Chest pain, unspecified: Secondary | ICD-10-CM | POA: Diagnosis present

## 2022-08-15 DIAGNOSIS — Z7984 Long term (current) use of oral hypoglycemic drugs: Secondary | ICD-10-CM

## 2022-08-15 DIAGNOSIS — E1122 Type 2 diabetes mellitus with diabetic chronic kidney disease: Secondary | ICD-10-CM | POA: Diagnosis not present

## 2022-08-15 DIAGNOSIS — I25119 Atherosclerotic heart disease of native coronary artery with unspecified angina pectoris: Secondary | ICD-10-CM | POA: Diagnosis not present

## 2022-08-15 DIAGNOSIS — Z7951 Long term (current) use of inhaled steroids: Secondary | ICD-10-CM

## 2022-08-15 DIAGNOSIS — Z880 Allergy status to penicillin: Secondary | ICD-10-CM | POA: Diagnosis not present

## 2022-08-15 DIAGNOSIS — R0789 Other chest pain: Principal | ICD-10-CM

## 2022-08-15 DIAGNOSIS — Z794 Long term (current) use of insulin: Secondary | ICD-10-CM | POA: Diagnosis not present

## 2022-08-15 DIAGNOSIS — Z6832 Body mass index (BMI) 32.0-32.9, adult: Secondary | ICD-10-CM

## 2022-08-15 DIAGNOSIS — I10 Essential (primary) hypertension: Secondary | ICD-10-CM | POA: Diagnosis not present

## 2022-08-15 DIAGNOSIS — Z955 Presence of coronary angioplasty implant and graft: Secondary | ICD-10-CM

## 2022-08-15 DIAGNOSIS — I129 Hypertensive chronic kidney disease with stage 1 through stage 4 chronic kidney disease, or unspecified chronic kidney disease: Secondary | ICD-10-CM | POA: Diagnosis not present

## 2022-08-15 DIAGNOSIS — E861 Hypovolemia: Secondary | ICD-10-CM | POA: Diagnosis present

## 2022-08-15 DIAGNOSIS — J449 Chronic obstructive pulmonary disease, unspecified: Secondary | ICD-10-CM | POA: Diagnosis present

## 2022-08-15 DIAGNOSIS — I5033 Acute on chronic diastolic (congestive) heart failure: Secondary | ICD-10-CM | POA: Diagnosis present

## 2022-08-15 DIAGNOSIS — N179 Acute kidney failure, unspecified: Secondary | ICD-10-CM | POA: Diagnosis present

## 2022-08-15 DIAGNOSIS — I252 Old myocardial infarction: Secondary | ICD-10-CM

## 2022-08-15 DIAGNOSIS — I25118 Atherosclerotic heart disease of native coronary artery with other forms of angina pectoris: Secondary | ICD-10-CM | POA: Diagnosis present

## 2022-08-15 DIAGNOSIS — E1165 Type 2 diabetes mellitus with hyperglycemia: Secondary | ICD-10-CM | POA: Diagnosis present

## 2022-08-15 DIAGNOSIS — G4733 Obstructive sleep apnea (adult) (pediatric): Secondary | ICD-10-CM | POA: Diagnosis present

## 2022-08-15 DIAGNOSIS — Z7982 Long term (current) use of aspirin: Secondary | ICD-10-CM

## 2022-08-15 DIAGNOSIS — E669 Obesity, unspecified: Secondary | ICD-10-CM | POA: Diagnosis present

## 2022-08-15 DIAGNOSIS — E785 Hyperlipidemia, unspecified: Secondary | ICD-10-CM | POA: Diagnosis present

## 2022-08-15 DIAGNOSIS — Z823 Family history of stroke: Secondary | ICD-10-CM

## 2022-08-15 DIAGNOSIS — I959 Hypotension, unspecified: Secondary | ICD-10-CM | POA: Diagnosis not present

## 2022-08-15 DIAGNOSIS — Z91030 Bee allergy status: Secondary | ICD-10-CM

## 2022-08-15 DIAGNOSIS — I209 Angina pectoris, unspecified: Secondary | ICD-10-CM | POA: Diagnosis not present

## 2022-08-15 DIAGNOSIS — Z79899 Other long term (current) drug therapy: Secondary | ICD-10-CM

## 2022-08-15 DIAGNOSIS — Z85820 Personal history of malignant melanoma of skin: Secondary | ICD-10-CM

## 2022-08-15 LAB — COMPREHENSIVE METABOLIC PANEL
ALT: 13 U/L (ref 0–44)
AST: 16 U/L (ref 15–41)
Albumin: 4.3 g/dL (ref 3.5–5.0)
Alkaline Phosphatase: 65 U/L (ref 38–126)
Anion gap: 13 (ref 5–15)
BUN: 76 mg/dL — ABNORMAL HIGH (ref 8–23)
CO2: 24 mmol/L (ref 22–32)
Calcium: 9.4 mg/dL (ref 8.9–10.3)
Chloride: 100 mmol/L (ref 98–111)
Creatinine, Ser: 3.57 mg/dL — ABNORMAL HIGH (ref 0.61–1.24)
GFR, Estimated: 16 mL/min — ABNORMAL LOW (ref >60)
Glucose, Bld: 97 mg/dL (ref 70–99)
Potassium: 3.7 mmol/L (ref 3.5–5.1)
Sodium: 137 mmol/L (ref 135–145)
Total Bilirubin: 0.8 mg/dL (ref 0.3–1.2)
Total Protein: 7.9 g/dL (ref 6.5–8.1)

## 2022-08-15 LAB — CBC WITH DIFFERENTIAL/PLATELET
Abs Immature Granulocytes: 0.09 10*3/uL — ABNORMAL HIGH (ref 0.00–0.07)
Basophils Absolute: 0.1 10*3/uL (ref 0.0–0.1)
Basophils Relative: 1 %
Eosinophils Absolute: 0.7 10*3/uL — ABNORMAL HIGH (ref 0.0–0.5)
Eosinophils Relative: 8 %
HCT: 39.8 % (ref 39.0–52.0)
Hemoglobin: 12.9 g/dL — ABNORMAL LOW (ref 13.0–17.0)
Immature Granulocytes: 1 %
Lymphocytes Relative: 14 %
Lymphs Abs: 1.4 10*3/uL (ref 0.7–4.0)
MCH: 30.6 pg (ref 26.0–34.0)
MCHC: 32.4 g/dL (ref 30.0–36.0)
MCV: 94.5 fL (ref 80.0–100.0)
Monocytes Absolute: 1.2 10*3/uL — ABNORMAL HIGH (ref 0.1–1.0)
Monocytes Relative: 12 %
Neutro Abs: 6.4 10*3/uL (ref 1.7–7.7)
Neutrophils Relative %: 64 %
Platelets: 261 10*3/uL (ref 150–400)
RBC: 4.21 MIL/uL — ABNORMAL LOW (ref 4.22–5.81)
RDW: 14.2 % (ref 11.5–15.5)
WBC: 9.8 10*3/uL (ref 4.0–10.5)
nRBC: 0 % (ref 0.0–0.2)

## 2022-08-15 LAB — TROPONIN I (HIGH SENSITIVITY)
Troponin I (High Sensitivity): 22 ng/L — ABNORMAL HIGH (ref <18)
Troponin I (High Sensitivity): 24 ng/L — ABNORMAL HIGH (ref <18)
Troponin I (High Sensitivity): 31 ng/L — ABNORMAL HIGH (ref <18)
Troponin I (High Sensitivity): 41 ng/L — ABNORMAL HIGH (ref <18)

## 2022-08-15 LAB — CBG MONITORING, ED
Glucose-Capillary: 212 mg/dL — ABNORMAL HIGH (ref 70–99)
Glucose-Capillary: 224 mg/dL — ABNORMAL HIGH (ref 70–99)
Glucose-Capillary: 203 mg/dL — ABNORMAL HIGH (ref 70–99)

## 2022-08-15 LAB — HEMOGLOBIN A1C
Hgb A1c MFr Bld: 7.5 % — ABNORMAL HIGH (ref 4.8–5.6)
Mean Plasma Glucose: 168.55 mg/dL

## 2022-08-15 LAB — BRAIN NATRIURETIC PEPTIDE: B Natriuretic Peptide: 88.8 pg/mL (ref 0.0–100.0)

## 2022-08-15 LAB — PROTIME-INR
INR: 1.1 (ref 0.8–1.2)
Prothrombin Time: 14.2 seconds (ref 11.4–15.2)

## 2022-08-15 LAB — MAGNESIUM: Magnesium: 2.6 mg/dL — ABNORMAL HIGH (ref 1.7–2.4)

## 2022-08-15 MED ORDER — ISOSORBIDE MONONITRATE ER 60 MG PO TB24
240.0000 mg | ORAL_TABLET | Freq: Every day | ORAL | Status: DC
  Administered 2022-08-15 – 2022-08-17 (×3): 240 mg via ORAL
  Filled 2022-08-15 (×3): qty 4

## 2022-08-15 MED ORDER — CITALOPRAM HYDROBROMIDE 20 MG PO TABS
10.0000 mg | ORAL_TABLET | Freq: Every day | ORAL | Status: DC
  Administered 2022-08-15 – 2022-08-17 (×3): 10 mg via ORAL
  Filled 2022-08-15 (×3): qty 1

## 2022-08-15 MED ORDER — COLCHICINE 0.6 MG PO TABS: 1.8000 mg | ORAL_TABLET | Freq: Every day | ORAL | Status: DC | PRN

## 2022-08-15 MED ORDER — NITROGLYCERIN 0.4 MG SL SUBL
0.4000 mg | SUBLINGUAL_TABLET | SUBLINGUAL | Status: DC | PRN
  Administered 2022-08-17 (×2): 0.4 mg via SUBLINGUAL
  Filled 2022-08-15 (×2): qty 1

## 2022-08-15 MED ORDER — LORATADINE 10 MG PO TABS: 10.0000 mg | ORAL_TABLET | Freq: Every day | ORAL | Status: DC | PRN

## 2022-08-15 MED ORDER — MORPHINE SULFATE (PF) 2 MG/ML IV SOLN: 1.0000 mg | INTRAVENOUS | Status: DC | PRN

## 2022-08-15 MED ORDER — MAGNESIUM OXIDE -MG SUPPLEMENT 400 (240 MG) MG PO TABS
400.0000 mg | ORAL_TABLET | Freq: Every day | ORAL | Status: DC
  Administered 2022-08-15 – 2022-08-17 (×3): 400 mg via ORAL
  Filled 2022-08-15 (×3): qty 1

## 2022-08-15 MED ORDER — ONDANSETRON HCL 4 MG/2ML IJ SOLN
4.0000 mg | Freq: Three times a day (TID) | INTRAMUSCULAR | Status: DC | PRN
  Administered 2022-08-17: 4 mg via INTRAVENOUS
  Filled 2022-08-15: qty 2

## 2022-08-15 MED ORDER — HYDRALAZINE HCL 20 MG/ML IJ SOLN
5.0000 mg | INTRAMUSCULAR | Status: DC | PRN
Start: 2022-08-15 — End: 2022-08-17

## 2022-08-15 MED ORDER — BUDESONIDE 0.5 MG/2ML IN SUSP
0.5000 mg | Freq: Two times a day (BID) | RESPIRATORY_TRACT | Status: DC
  Administered 2022-08-15 – 2022-08-17 (×5): 0.5 mg via RESPIRATORY_TRACT
  Filled 2022-08-15 (×5): qty 2

## 2022-08-15 MED ORDER — SODIUM CHLORIDE 0.9 % IV SOLN: INTRAVENOUS | Status: DC

## 2022-08-15 MED ORDER — ROSUVASTATIN CALCIUM 10 MG PO TABS
10.0000 mg | ORAL_TABLET | Freq: Every day | ORAL | Status: DC
  Administered 2022-08-15 – 2022-08-16 (×2): 10 mg via ORAL
  Filled 2022-08-15 (×2): qty 1

## 2022-08-15 MED ORDER — HYDRALAZINE HCL 25 MG PO TABS
25.0000 mg | ORAL_TABLET | Freq: Two times a day (BID) | ORAL | Status: DC
  Administered 2022-08-15 – 2022-08-17 (×5): 25 mg via ORAL
  Filled 2022-08-15 (×5): qty 1

## 2022-08-15 MED ORDER — METOPROLOL SUCCINATE ER 50 MG PO TB24
50.0000 mg | ORAL_TABLET | Freq: Every day | ORAL | Status: DC
Start: 2022-08-15 — End: 2022-08-17
  Administered 2022-08-15 – 2022-08-17 (×3): 50 mg via ORAL
  Filled 2022-08-15 (×3): qty 1

## 2022-08-15 MED ORDER — MONTELUKAST SODIUM 10 MG PO TABS
10.0000 mg | ORAL_TABLET | Freq: Every day | ORAL | Status: DC
  Administered 2022-08-15 – 2022-08-16 (×2): 10 mg via ORAL
  Filled 2022-08-15 (×2): qty 1

## 2022-08-15 MED ORDER — POLYETHYLENE GLYCOL 3350 17 G PO PACK: 17.0000 g | PACK | Freq: Every day | ORAL | Status: DC | PRN

## 2022-08-15 MED ORDER — ZOLPIDEM TARTRATE 5 MG PO TABS: 5.0000 mg | ORAL_TABLET | Freq: Every evening | ORAL | Status: DC | PRN

## 2022-08-15 MED ORDER — SENNOSIDES-DOCUSATE SODIUM 8.6-50 MG PO TABS
1.0000 | ORAL_TABLET | Freq: Every day | ORAL | Status: DC
  Administered 2022-08-15 – 2022-08-16 (×2): 1 via ORAL
  Filled 2022-08-15 (×2): qty 1

## 2022-08-15 MED ORDER — FERROUS SULFATE 325 (65 FE) MG PO TABS
325.0000 mg | ORAL_TABLET | Freq: Every day | ORAL | Status: DC
  Administered 2022-08-16 – 2022-08-17 (×2): 325 mg via ORAL
  Filled 2022-08-15 (×2): qty 1

## 2022-08-15 MED ORDER — DM-GUAIFENESIN ER 30-600 MG PO TB12: 1.0000 | ORAL_TABLET | Freq: Two times a day (BID) | ORAL | Status: DC | PRN

## 2022-08-15 MED ORDER — HEPARIN SODIUM (PORCINE) 5000 UNIT/ML IJ SOLN
5000.0000 [IU] | Freq: Three times a day (TID) | INTRAMUSCULAR | Status: DC
  Administered 2022-08-15 – 2022-08-17 (×8): 5000 [IU] via SUBCUTANEOUS
  Filled 2022-08-15 (×8): qty 1

## 2022-08-15 MED ORDER — INSULIN GLARGINE-YFGN 100 UNIT/ML ~~LOC~~ SOLN
40.0000 [IU] | Freq: Every day | SUBCUTANEOUS | Status: DC
  Administered 2022-08-15 – 2022-08-16 (×2): 40 [IU] via SUBCUTANEOUS
  Filled 2022-08-15 (×3): qty 0.4

## 2022-08-15 MED ORDER — PANTOPRAZOLE SODIUM 40 MG PO TBEC
40.0000 mg | DELAYED_RELEASE_TABLET | Freq: Two times a day (BID) | ORAL | Status: DC
  Administered 2022-08-15 – 2022-08-17 (×4): 40 mg via ORAL
  Filled 2022-08-15 (×4): qty 1

## 2022-08-15 MED ORDER — ACETAMINOPHEN 325 MG PO TABS: 650.0000 mg | ORAL_TABLET | Freq: Four times a day (QID) | ORAL | Status: DC | PRN

## 2022-08-15 MED ORDER — MORPHINE SULFATE (PF) 4 MG/ML IV SOLN
4.0000 mg | Freq: Once | INTRAVENOUS | Status: AC
  Administered 2022-08-15: 4 mg via INTRAVENOUS
  Filled 2022-08-15: qty 1

## 2022-08-15 MED ORDER — FLUTICASONE PROPIONATE 50 MCG/ACT NA SUSP
1.0000 | Freq: Every day | NASAL | Status: DC
  Administered 2022-08-15 – 2022-08-17 (×3): 1 via NASAL
  Filled 2022-08-15: qty 16

## 2022-08-15 MED ORDER — ASPIRIN 81 MG PO CHEW
81.0000 mg | CHEWABLE_TABLET | Freq: Every day | ORAL | Status: DC
  Administered 2022-08-16 – 2022-08-17 (×2): 81 mg via ORAL
  Filled 2022-08-15 (×2): qty 1

## 2022-08-15 MED ORDER — ALBUTEROL SULFATE (2.5 MG/3ML) 0.083% IN NEBU: 3.0000 mL | INHALATION_SOLUTION | RESPIRATORY_TRACT | Status: DC | PRN

## 2022-08-15 MED ORDER — ARFORMOTEROL TARTRATE 15 MCG/2ML IN NEBU
15.0000 ug | INHALATION_SOLUTION | Freq: Two times a day (BID) | RESPIRATORY_TRACT | Status: DC
  Administered 2022-08-15 – 2022-08-17 (×5): 15 ug via RESPIRATORY_TRACT
  Filled 2022-08-15 (×6): qty 2

## 2022-08-15 MED ORDER — VITAMIN B-12 1000 MCG PO TABS
1000.0000 ug | ORAL_TABLET | Freq: Every day | ORAL | Status: DC
  Administered 2022-08-15 – 2022-08-17 (×3): 1000 ug via ORAL
  Filled 2022-08-15 (×3): qty 1

## 2022-08-15 MED ORDER — METHOCARBAMOL 500 MG PO TABS
500.0000 mg | ORAL_TABLET | Freq: Two times a day (BID) | ORAL | Status: DC
  Administered 2022-08-15 – 2022-08-17 (×5): 500 mg via ORAL
  Filled 2022-08-15 (×6): qty 1

## 2022-08-15 MED ORDER — INSULIN ASPART 100 UNIT/ML IJ SOLN
0.0000 [IU] | Freq: Every day | INTRAMUSCULAR | Status: DC
  Administered 2022-08-15 – 2022-08-16 (×2): 2 [IU] via SUBCUTANEOUS
  Filled 2022-08-15 (×2): qty 1

## 2022-08-15 MED ORDER — AZELASTINE HCL 0.1 % NA SOLN
1.0000 | Freq: Every day | NASAL | Status: DC
  Administered 2022-08-15 – 2022-08-17 (×3): 1 via NASAL
  Filled 2022-08-15: qty 30

## 2022-08-15 MED ORDER — CLOPIDOGREL BISULFATE 75 MG PO TABS
75.0000 mg | ORAL_TABLET | Freq: Every day | ORAL | Status: DC
Start: 2022-08-15 — End: 2022-08-17
  Administered 2022-08-15 – 2022-08-17 (×3): 75 mg via ORAL
  Filled 2022-08-15 (×3): qty 1

## 2022-08-15 MED ORDER — INSULIN ASPART 100 UNIT/ML IJ SOLN
0.0000 [IU] | Freq: Three times a day (TID) | INTRAMUSCULAR | Status: DC
  Administered 2022-08-15 (×2): 3 [IU] via SUBCUTANEOUS
  Administered 2022-08-16: 2 [IU] via SUBCUTANEOUS
  Administered 2022-08-16: 1 [IU] via SUBCUTANEOUS
  Administered 2022-08-16: 2 [IU] via SUBCUTANEOUS
  Administered 2022-08-17 (×2): 1 [IU] via SUBCUTANEOUS
  Filled 2022-08-15 (×7): qty 1

## 2022-08-15 MED ORDER — CALCITRIOL 0.25 MCG PO CAPS
0.2500 ug | ORAL_CAPSULE | Freq: Every day | ORAL | Status: DC
  Administered 2022-08-15 – 2022-08-17 (×3): 0.25 ug via ORAL
  Filled 2022-08-15 (×3): qty 1

## 2022-08-15 MED ORDER — UMECLIDINIUM BROMIDE 62.5 MCG/ACT IN AEPB
1.0000 | INHALATION_SPRAY | Freq: Every day | RESPIRATORY_TRACT | Status: DC
  Administered 2022-08-15 – 2022-08-17 (×3): 1 via RESPIRATORY_TRACT
  Filled 2022-08-15: qty 7

## 2022-08-15 NOTE — Assessment & Plan Note (Signed)
-   Crestor 

## 2022-08-15 NOTE — ED Notes (Signed)
Pt resting comfortably at this time. NAD noted. Pt denies any needs at this time. Pt assisted with using phone. Call bell in reach.

## 2022-08-15 NOTE — Assessment & Plan Note (Signed)
Recent A1c 7.9, poorly controlled.  Patient is a taking Jardiance, NovoLog, Lantus 66 units daily -Sliding scale insulin -Glargine insulin 40 units daily

## 2022-08-15 NOTE — Assessment & Plan Note (Signed)
-  see above 

## 2022-08-15 NOTE — ED Triage Notes (Signed)
Pt presents via EMS with complaints of midsternal CP that started tonight. Pt took 3 SL nitro without improvement and was given 3 nitro sprays and 1 inch nitro paste without improvement to sx. Pt rates the pain a 6/10 without radiation. Denies SOB.

## 2022-08-15 NOTE — Assessment & Plan Note (Addendum)
Hemoglobin 12.9, stable -Follow-up with CBC -Continue iron supplement

## 2022-08-15 NOTE — Progress Notes (Signed)
PT placed on CPAP 21% for HS, with a Lg nasal mask. Tolerating well, on Auto-set 5-20 cm. Per Patients home settings.

## 2022-08-15 NOTE — Progress Notes (Signed)
[facility]  ROUNDING NOTE   Subjective:   Mike Decker is a 81 y.o. male with past medical conditions including CKD-IV, CAD, dCHF, HTN, HDL, stroke, gout, cancer of prostate and esophagus, and anemia.  Patient presents to the ED with complaints of chest pain that started overnight and has been admitted for Chest pain [R07.9] NSTEMI (non-ST elevated myocardial infarction) [facility]) [I21.4]  Patient is known to our practice and is followed by Dr. Juleen China.  Patient was last seen in the office on 04/18/2022.  Patient states he was in his usual state of health yesterday.Patient states he has continued to take all medications. Denies nausea and vomiting. No known fever or chills.   Labs on ED arrival significant for BUN 76 and creatinine 3.57 with GFR 16.  Troponin 22 with BNP 88.  Hemoglobin 12.9.  Chest x-ray negative.Echo pending.  We have been consulted to evaluate acute kidney injury.  Objective:  Vital signs in last 24 hours:  Temp:  [98 F (36.7 C)-98.7 F (37.1 C)] 98.7 F (37.1 C) (09/06 1100) Pulse Rate:  [62-71] 71 (09/06 1100) Resp:  [11-20] 16 (09/06 1100) BP: (129-189)/(55-76) 134/65 (09/06 1100) SpO2:  [94 %-100 %] 98 % (09/06 1100) Weight:  [92.5 kg] 92.5 kg (09/06 0409)  Weight change:  Filed Weights   08/15/22 0409  Weight: 92.5 kg    Intake/Output: No intake/output data recorded.   Intake/Output this shift:  No intake/output data recorded.  Physical Exam: General: NAD, resting comfortably  Head: Normocephalic, atraumatic. Moist oral mucosal membranes  Eyes: Anicteric  Lungs:  Clear to auscultation, normal effort, room air  Heart: Regular rate and rhythm  Abdomen:  Soft, nontender,, distended  Extremities: No peripheral edema.  Neurologic: Nonfocal, moving all four extremities  Skin: No lesions  Access: None    Basic Metabolic Panel: Recent Labs  Lab 08/15/22 0408  NA 137  K 3.7  CL 100  CO2 24  GLUCOSE 97  BUN 76*  CREATININE  3.57*  CALCIUM 9.4  MG 2.6*    Liver Function Tests: Recent Labs  Lab 08/15/22 0408  AST 16  ALT 13  ALKPHOS 65  BILITOT 0.8  PROT 7.9  ALBUMIN 4.3   No results for input(s): "LIPASE", "AMYLASE" in the last 168 hours. No results for input(s): "AMMONIA" in the last 168 hours.  CBC: Recent Labs  Lab 08/15/22 0408  WBC 9.8  NEUTROABS 6.4  HGB 12.9*  HCT 39.8  MCV 94.5  PLT 261    Cardiac Enzymes: No results for input(s): "CKTOTAL", "CKMB", "CKMBINDEX", "TROPONINI" in the last 168 hours.  BNP: Invalid input(s): "POCBNP"  CBG: Recent Labs  Lab 08/15/22 1149  GLUCAP 212*    Microbiology: Results for orders placed or performed in visit on 11/25/20  Gram Stain w/Sputum Cult Rflx     Status: None   Collection Time: 11/25/20  5:16 PM   SP  Result Value Ref Range Status   White Blood Cells Few  Final   Epithelial Cells Few  Final   Result 1 Comment  Final    Comment: Few gram positive cocci   Gram Stain Evaluation Comment  Final    Comment: This specimen is of good quality and is acceptable for routine bacterial culture.   Sputum Culture     Status: None   Collection Time: 11/25/20  5:16 PM   SP  Result Value Ref Range Status   Lower Respiratory Culture Final report  Final   Result  1 Comment  Final    Comment: Routine respiratory flora Heavy growth     Coagulation Studies: Recent Labs    08/15/22 0754  LABPROT 14.2  INR 1.1    Urinalysis: No results for input(s): "COLORURINE", "LABSPEC", "PHURINE", "GLUCOSEU", "HGBUR", "BILIRUBINUR", "KETONESUR", "PROTEINUR", "UROBILINOGEN", "NITRITE", "LEUKOCYTESUR" in the last 72 hours.  Invalid input(s): "APPERANCEUR"    Imaging: DG Chest Portable 1 View  Result Date: 08/15/2022 CLINICAL DATA:  Chest pain.  No improvement with sublingual nitro. EXAM: PORTABLE CHEST 1 VIEW COMPARISON:  PA Lat 09/22/2020. FINDINGS: The heart size and mediastinal contours are within normal limits with mild aortic  atherosclerosis. Both lungs are clear. The visualized skeletal structures are unremarkable. IMPRESSION: No radiographic evidence of acute chest disease. Electronically Signed   By: Telford Nab M.D.   On: 08/15/2022 04:46     Medications:    sodium chloride 40 mL/hr at 08/15/22 1144    arformoterol  15 mcg Nebulization BID   And   umeclidinium bromide  1 puff Inhalation Daily   [START ON 08/16/2022] aspirin  81 mg Oral Daily   azelastine  1 spray Each Nare Q1400   budesonide  0.5 mg Inhalation BID   calcitRIOL  0.25 mcg Oral Daily   citalopram  10 mg Oral Daily   clopidogrel  75 mg Oral Daily   cyanocobalamin  1,000 mcg Oral Daily   [START ON 08/16/2022] ferrous sulfate  325 mg Oral Q breakfast   fluticasone  1 spray Each Nare Daily   heparin  5,000 Units Subcutaneous Q8H   hydrALAZINE  25 mg Oral BID   insulin aspart  0-5 Units Subcutaneous QHS   insulin aspart  0-9 Units Subcutaneous TID WC   insulin glargine-yfgn  40 Units Subcutaneous QHS   isosorbide mononitrate  240 mg Oral Daily   magnesium oxide  400 mg Oral Daily   methocarbamol  500 mg Oral BID   metoprolol succinate  50 mg Oral Daily   montelukast  10 mg Oral QHS   pantoprazole  40 mg Oral BID AC   rosuvastatin  10 mg Oral QHS   acetaminophen, albuterol, colchicine, dextromethorphan-guaiFENesin, hydrALAZINE, loratadine, morphine injection, nitroGLYCERIN, ondansetron (ZOFRAN) IV  Assessment/ Plan:  Mike Decker is a 81 y.o.  male with past medical conditions including CKD-IV, CAD, dCHF, HTN, HDL, stroke, gout, cancer of prostate and esophagus, and anemia.  Patient presents to the ED with complaints of chest pain that started overnight and has been admitted for Chest pain [R07.9] NSTEMI (non-ST elevated myocardial infarction) ([place]) [I21.4]   Acute Kidney Injury on chronic kidney disease stage IV with baseline creatinine 2.71 and GFR of 23 on 5//23.  Acute kidney injury secondary to hypovolemia with continued  diuretic use.  No IV contrast exposure.  Torsemide currently held.  Creatinine currently elevated to 3.57.  Patient appears to be dehydrated.  We will offer gentle hydration with normal saline at 40 mL/h and reevaluate patient and labs tomorrow.  Avoid nephrotoxic agents and therapies.  Lab Results  Component Value Date   CREATININE 3.57 (H) 08/15/2022   CREATININE 2.04 (H) 12/25/2021   CREATININE 2.83 (H) 10/03/2021   No intake or output data in the 24 hours ending 08/15/22 1341  2.  Hypertension with chronic kidney disease.  Home regimen includes hydralazine, isosorbide,, metoprolol, Entresto and torsemide.  Entresto and torsemide currently held in setting of kidney injury.  3. Diabetes mellitus type II with chronic kidney disease/renal manifestations: insulin dependent. Home  regimen includes Lantus, NovoLog, and Jardiance. Most recent hemoglobin A1c is 7.5 on 08/15/2022.   4. Anemia of chronic kidney disease Lab Results  Component Value Date   HGB 12.9 (L) 08/15/2022    Hemoglobin within acceptable range.   LOS: 0 Shanayah Kaffenberger 9/6/20231:41 PM

## 2022-08-15 NOTE — Assessment & Plan Note (Signed)
2D echo on 12/29/2019 showed EF of 60 to 65%.  Patient does not have leg edema.  BNP 88.  CHF is compensated. -Hold torsemide due to worsening renal function -Watch volume status closely

## 2022-08-15 NOTE — Assessment & Plan Note (Signed)
-   IV hydralazine as needed -Patient is on metoprolol, oral hydralazine, Imdur, -Hold Entresto due to worsening renal function

## 2022-08-15 NOTE — Assessment & Plan Note (Signed)
-   Continue home medications 

## 2022-08-15 NOTE — Assessment & Plan Note (Signed)
Allopurinol  

## 2022-08-15 NOTE — Assessment & Plan Note (Addendum)
NSTEMI and hx of CAD: trop 22 --> 24 --> 31 --> 41. Consulted Dr. Nehemiah Massed of card.  - place to tele bedfor observation - Trend Trop - prn Nitroglycerin, Morphine - Aspirin, plavix, Crestor, metoprolol - Risk factor stratification: will check FLP and A1C  - 2d echo - hold Ranex due to worsening renal Fx per card -Continue antianginals with Imdur 240 mg once daily -Continue metoprolol XL

## 2022-08-15 NOTE — H&P (Signed)
History and Physical    Mike Decker NAT:[id number] DOB: 12/23/1940 DOA: 08/15/2022  Referring MD/NP/PA:   PCP: Mike Athens, MD   Patient coming from:  The patient is coming from home.  At baseline, pt is independent for most of ADL.        Chief Complaint: chest pain  HPI: Mike Decker is a 81 y.o. male with medical history significant of CKD-IV, CAD, dCHF, HTN, HDL, stroke, gout, cancer of prostate and esophagus, anemia, who presents with chest pain.    Patient states that he had chest pain 2 days ago which resolved after taking nitroglycerin.  He started having chest pain again last night, which has been more persistent.  The chest pain is located in the substernal area, 6 out of 10 in severity, now radiating, pressure-like.  Patient has dry cough, no shortness of breath, fever or chills.  Denies nausea, vomiting, diarrhea or abdominal pain.  No symptoms of UTI.  Data reviewed independently and ED Course: pt was found to have troponin level 22, 24, 31, 41.  WBC 9.8, worsening renal function with creatinine 3.57, BUN 76, GFR 16 (baseline creatinine 2.04 on 12/25/2021), temperature normal, blood pressure 178/69, heart rate 69, RR 20, oxygen saturation 95% on room air.  Chest x-ray negative.  Patient is placed on telemetry bed forobs. Dr. Nehemiah Massed of cardiology and Dr. Holley Raring of renal are consulted.   EKG: I have personally reviewed.  Sinus rhythm, QTc 477, LAE, early R wave progression   Review of Systems:   General: no fevers, chills, no body weight gain,  has fatigue HEENT: no blurry vision, hearing changes or sore throat Respiratory: no dyspnea, has coughing, no wheezing CV: has chest pain, no palpitations GI: no nausea, vomiting, abdominal pain, diarrhea, constipation GU: no dysuria, burning on urination, increased urinary frequency, hematuria  Ext: no leg edema Neuro: no unilateral weakness, numbness, or tingling, no vision change or hearing loss Skin: no rash,  no skin tear. MSK: No muscle spasm, no deformity, no limitation of range of movement in spin Heme: No easy bruising.  Travel history: No recent long distant travel.   Allergy:  Allergies  Allergen Reactions   Gabapentin     Other reaction(s): Other (See Comments) Tremors   Peanut-Containing Drug Products Anaphylaxis   Penicillins Hives and Rash    Has patient had a PCN reaction causing immediate rash, facial/tongue/throat swelling, SOB or lightheadedness with hypotension: Yes Has patient had a PCN reaction causing severe rash involving mucus membranes or skin necrosis: No Has patient had a PCN reaction that required hospitalization No Has patient had a PCN reaction occurring within the last 10 years: No If all of the above answers are "NO", then may proceed with Cephalosporin use.   Bee Venom Swelling   Influenza Vaccines Hives   Inh [Isoniazid] Hives   Jardiance [Empagliflozin] Other (See Comments)    Diarrhea   Kenalog [Triamcinolone Acetonide] Hives   Levaquin [Levofloxacin] Other (See Comments)    Tendon, ligament pain.    Lisinopril     Other reaction(s): Hyperkalemia   Nalfon [Fenoprofen Calcium] Hives   Naproxen    Peanut Oil    Nsaids Rash    Nalfon 600 Nalfon 600    Past Medical History:  Diagnosis Date   Anemia    Arthritis    Atrioventricular canal (AVC)    irregular heart beats   Barrett esophagus    Bronchiolitis    Cancer ([place]) 2002   prostate, esphageal  Chronic diastolic CHF (congestive heart failure) (HCC)    Colon polyp    Diabetes mellitus without complication ([place])    Diverticulosis    Gout    Heart disease    Hemangioma    liver   Hyperlipidemia    Hypertension    Myocardial infarct [facility])    Ocular hypertension    Peripheral vascular disease ([place])    Skin cancer    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[place])    Sleep apnea    Vitreoretinal degeneration     Past Surgical History:  Procedure Laterality Date   CATARACT EXTRACTION  2011, 2012    COLONOSCOPY N/A 12/14/2018   Procedure: COLONOSCOPY;  Surgeon: Virgel Manifold, MD;  Location: [facility] ENDOSCOPY;  Service: Endoscopy;  Laterality: N/A;   CORONARY ANGIOPLASTY WITH STENT PLACEMENT  2012   CORONARY STENT INTERVENTION N/A 12/29/2019   Procedure: CORONARY STENT INTERVENTION;  Surgeon: Nelva Bush, MD;  Location: [place] CV LAB;  Service: Cardiovascular;  Laterality: N/A;   ESOPHAGOGASTRODUODENOSCOPY N/A 12/14/2018   Procedure: ESOPHAGOGASTRODUODENOSCOPY (EGD);  Surgeon: Virgel Manifold, MD;  Location: [facility] ENDOSCOPY;  Service: Endoscopy;  Laterality: N/A;   ESOPHAGOGASTRODUODENOSCOPY (EGD) WITH PROPOFOL N/A 06/01/2016   Procedure: ESOPHAGOGASTRODUODENOSCOPY (EGD) WITH PROPOFOL;  Surgeon: Manya Silvas, MD;  Location: [facility] ENDOSCOPY;  Service: Endoscopy;  Laterality: N/A;   HERNIA REPAIR     x2   INTRAOCULAR LENS INSERTION     LEFT HEART CATH AND CORONARY ANGIOGRAPHY N/A 05/09/2017   Procedure: Left Heart Cath and Coronary Angiography;  Surgeon: Yolonda Kida, MD;  Location: [place] [facility] LAB;  Service: Cardiovascular;  Laterality: N/A;   LEFT HEART CATH AND CORONARY ANGIOGRAPHY N/A 12/08/2018   Procedure: LEFT HEART CATH AND CORONARY ANGIOGRAPHY and possible PCI and stent;  Surgeon: Yolonda Kida, MD;  Location: [place] CV LAB;  Service: Cardiovascular;  Laterality: N/A;   LEFT HEART CATH AND CORONARY ANGIOGRAPHY N/A 12/29/2019   Procedure: LEFT HEART CATH AND CORONARY ANGIOGRAPHY;  Surgeon: Nelva Bush, MD;  Location: [place] CV LAB;  Service: Cardiovascular;  Laterality: N/A;   NOSE SURGERY     submucous resection   PROSTATE SURGERY  2002   ROTATOR CUFF REPAIR Right     Social History:  reports that he quit smoking about 45 years ago. His smoking use included cigarettes. He has never used smokeless tobacco. He reports that he does not drink alcohol and does not use drugs.  Family History:  Family History  Problem Relation Age of  Onset   Arthritis Mother    Stroke Maternal Grandfather    Breast cancer Neg Hx      Prior to Admission medications   Medication Sig Start Date End Date Taking? Authorizing Provider  acetaminophen (TYLENOL) 500 MG tablet Take by mouth.    [provider]  allopurinol (ZYLOPRIM) 100 MG tablet Take 100 mg by mouth daily.  Patient not taking: Reported on 06/26/2022    [provider]  aspirin EC 81 MG tablet Take by mouth. 04/26/22   [provider]  azelastine (ASTELIN) 0.1 % nasal spray USE 1 TO 2 SPRAYS IN EACH NOSTRIL TWICE A DAY 06/01/20   [provider]  calcitRIOL (ROCALTROL) 0.25 MCG capsule Take by mouth. 01/10/22 04/12/23  [provider]  cetirizine (ZYRTEC) 5 MG tablet Take 5 mg by mouth daily as needed for allergies or rhinitis.     [provider]  citalopram (CELEXA) 10 MG tablet Take 1 tablet (10 mg  total) by mouth daily. 03/12/22   Mike Athens, MD  clopidogrel (PLAVIX) 75 MG tablet TAKE 1 TABLET BY MOUTH EVERY DAY 07/02/22   Mike Athens, MD  colchicine 0.6 MG tablet Take 3 tablets (1.8 mg total) by mouth daily as needed (gout flares). 12/30/19   Enzo Bi, MD  [facility] VITAMIN B12 1000 MCG tablet TAKE 1 TABLET BY MOUTH EVERY DAY 11/21/21   Earlie Server, MD  Cyanocobalamin 1000 MCG SUBL Take by mouth.    [provider]  dextromethorphan-guaiFENesin (MUCINEX DM) 30-600 MG 12hr tablet Take 1 tablet by mouth 2 (two) times daily as needed for cough. Patient not taking: Reported on 06/26/2022    [provider]  empagliflozin (JARDIANCE) 10 MG TABS tablet Take 1 tablet by mouth daily. Patient not taking: Reported on 06/26/2022 02/19/22   [provider]  EPINEPHrine (EPI-PEN) 0.3 mg/0.3 mL SOAJ injection Inject into the muscle as needed (anaphylaxis).     [provider]  ferrous sulfate 325 (65 FE) MG EC tablet TAKE 1 TABLET BY MOUTH EVERY DAY 02/06/21   Earlie Server, MD  fluticasone [facility]) 50 MCG/ACT nasal  spray USE 1 SPRAY IN EACH NOSTRIL EVERY DAY 09/12/20   Mike Athens, MD  Fluticasone-Umeclidin-Vilant (TRELEGY ELLIPTA) 100-62.5-25 MCG/INH AEPB Inhale 1 puff into the lungs daily. 11/24/20   Allyne Gee, MD  folic acid (FOLVITE) 361 MCG tablet TAKE 1 TABLET BY MOUTH DAILY. 11/21/21   Earlie Server, MD  hydrALAZINE (APRESOLINE) 50 MG tablet TAKE ONE TABLET BY MOUTH TWICE A DAY HOLD DOSE FOR SYSTOLIC BLOOD PRESSURE  LESS THAN 100 09/12/20   [provider]  insulin aspart (NOVOLOG) 100 UNIT/ML FlexPen 15 UNITS IN AM, 25 UNITS AT LUNCH, 25 UNITS AT [facility] 04/25/20   [provider]  insulin glargine (LANTUS) 100 UNIT/ML injection Inject 0.35 mLs (35 Units total) into the skin at bedtime. This is a decrease from your previous 45 units nightly. Patient taking differently: Inject 60 Units into the skin at bedtime. This is a decrease from your previous 45 units nightly. 12/30/19   Enzo Bi, MD  isosorbide mononitrate (IMDUR) 120 MG 24 hr tablet Take 2 tab 12/16/20   [provider]  JARDIANCE 10 MG TABS tablet Take by mouth. 02/19/22   [provider]  magnesium oxide (MAG-OX) 400 (240 Mg) MG tablet Take 1 tablet by mouth daily. 04/25/22   [provider]  methocarbamol (ROBAXIN) 500 MG tablet Take 500 mg by mouth 2 (two) times daily.  11/08/18   [provider]  metoprolol tartrate (LOPRESSOR) 50 MG tablet Take 50 mg by mouth 2 (two) times daily. 10/15/17   [provider]  montelukast (SINGULAIR) 10 MG tablet TAKE 1 TABLET BY MOUTH EVERYDAY AT BEDTIME 07/18/22   McDonough, Lauren K, PA-C  nitroGLYCERIN (NITROSTAT) 0.4 MG SL tablet Place 0.4 mg under the tongue every 5 (five) minutes x 3 doses as needed for chest pain.     [provider]  Omega-3 Fatty Acids (FISH OIL) 1000 MG CAPS Take 1 capsule by mouth daily.  Patient not taking: Reported on 06/26/2022    [provider]  pantoprazole (PROTONIX) 40 MG tablet Take 1 tablet (40 mg  total) by mouth 2 (two) times daily before a meal. 12/17/19   Amin, Jeanella Flattery, MD  ranolazine (RANEXA) 500 MG 12 hr tablet Take 500 mg by mouth 2 (two) times daily. 06/06/22   [provider]  rosuvastatin (CRESTOR) 40 MG tablet Take 40  mg by mouth every evening.     [provider]  sacubitril-valsartan (ENTRESTO) 24-26 MG Take 1 tablet by mouth 2 (two) times daily. Patient not taking: Reported on 06/26/2022 01/17/21   Mike Athens, MD  sodium bicarbonate 650 MG tablet Take 650 mg by mouth 2 (two) times daily.    [provider]  sodium fluoride (DENTA 5000 PLUS) 1.1 % CREA dental cream See admin instructions. 01/03/22   [provider]  Torsemide 40 MG TABS Take 40 mg by mouth once daily Patient not taking: Reported on 06/26/2022 07/20/21   [provider]  Torsemide 60 MG TABS Take by mouth. 06/07/22 06/07/23  [provider]  Vitamin D, Ergocalciferol, 50000 units CAPS Take 1 capsule by mouth every 30 (thirty) days.    [provider]    Physical Exam: Vitals:   08/15/22 0900 08/15/22 1100 08/15/22 1457 08/15/22 1800  BP: (!) 129/55 134/65 132/70 138/75  Pulse: 68 71 71 69  Resp: '14 16 19 '$ (!) 23  Temp:  98.7 F (37.1 C) 98.6 F (37 C) 98.7 F (37.1 C)  TempSrc:  Oral Oral Oral  SpO2: 94% 98% 95% 96%  Weight:      Height:       General: Not in acute distress HEENT:       Eyes: PERRL, EOMI, no scleral icterus.       ENT: No discharge from the ears and nose, no pharynx injection, no tonsillar enlargement.        Neck: No JVD, no bruit, no mass felt. Heme: No neck lymph node enlargement. Cardiac: S1/S2, RRR, No gallops or rubs. Respiratory: No rales, wheezing, rhonchi or rubs. GI: Soft, nondistended, nontender, no rebound pain, no organomegaly, BS present. GU: No hematuria Ext: No pitting leg edema bilaterally. 1+DP/PT pulse bilaterally. Musculoskeletal: No joint deformities, No joint redness or warmth, no limitation of  ROM in spin. Skin: No rashes.  Neuro: Alert, oriented X3, cranial nerves II-XII grossly intact, moves all extremities normally.  Psych: Patient is not psychotic, no suicidal or hemocidal ideation.  Labs on Admission: I have personally reviewed following labs and imaging studies  CBC: Recent Labs  Lab 08/15/22 0408  WBC 9.8  NEUTROABS 6.4  HGB 12.9*  HCT 39.8  MCV 94.5  PLT 622   Basic Metabolic Panel: Recent Labs  Lab 08/15/22 0408  NA 137  K 3.7  CL 100  CO2 24  GLUCOSE 97  BUN 76*  CREATININE 3.57*  CALCIUM 9.4  MG 2.6*   GFR: Estimated Creatinine Clearance: 17 mL/min (A) (by C-G formula based on SCr of 3.57 mg/dL (H)). Liver Function Tests: Recent Labs  Lab 08/15/22 0408  AST 16  ALT 13  ALKPHOS 65  BILITOT 0.8  PROT 7.9  ALBUMIN 4.3   No results for input(s): "LIPASE", "AMYLASE" in the last 168 hours. No results for input(s): "AMMONIA" in the last 168 hours. Coagulation Profile: Recent Labs  Lab 08/15/22 0754  INR 1.1   Cardiac Enzymes: No results for input(s): "CKTOTAL", "CKMB", "CKMBINDEX", "TROPONINI" in the last 168 hours. BNP (last 3 results) No results for input(s): "PROBNP" in the last 8760 hours. HbA1C: Recent Labs    08/15/22 0956  HGBA1C 7.5*   CBG: Recent Labs  Lab 08/15/22 1149 08/15/22 1622  GLUCAP 212* 224*   Lipid Profile: No results for input(s): "CHOL", "HDL", "LDLCALC", "TRIG", "CHOLHDL", "LDLDIRECT" in the last 72 hours. Thyroid Function Tests: No results for input(s): "TSH", "T4TOTAL", "FREET4", "T3FREE", "  THYROIDAB" in the last 72 hours. Anemia Panel: No results for input(s): "VITAMINB12", "FOLATE", "FERRITIN", "TIBC", "IRON", "RETICCTPCT" in the last 72 hours. Urine analysis:    Component Value Date/Time   COLORURINE YELLOW (A) 12/11/2018 1512   APPEARANCEUR CLEAR (A) 12/11/2018 1512   LABSPEC 1.013 12/11/2018 1512   PHURINE 5.0 12/11/2018 1512   GLUCOSEU NEGATIVE 12/11/2018 1512   HGBUR NEGATIVE 12/11/2018  1512   BILIRUBINUR 1+ 11/01/2020 1512   KETONESUR NEGATIVE 12/11/2018 1512   PROTEINUR Positive (A) 11/01/2020 1512   PROTEINUR NEGATIVE 12/11/2018 1512   UROBILINOGEN 2.0 (A) 11/01/2020 1512   NITRITE positive 11/01/2020 1512   NITRITE NEGATIVE 12/11/2018 1512   LEUKOCYTESUR Large (3+) (A) 11/01/2020 1512   Sepsis Labs: '@LABRCNTIP'$ (procalcitonin:4,lacticidven:4) )No results found for this or any previous visit (from the past 240 hour(s)).   Radiological Exams on Admission: DG Chest Portable 1 View  Result Date: 08/15/2022 CLINICAL DATA:  Chest pain.  No improvement with sublingual nitro. EXAM: PORTABLE CHEST 1 VIEW COMPARISON:  PA Lat 09/22/2020. FINDINGS: The heart size and mediastinal contours are within normal limits with mild aortic atherosclerosis. Both lungs are clear. The visualized skeletal structures are unremarkable. IMPRESSION: No radiographic evidence of acute chest disease. Electronically Signed   By: Telford Nab M.D.   On: 08/15/2022 04:46      Assessment/Plan Principal Problem:   NSTEMI (non-ST elevated myocardial infarction) ([place]) Active Problems:   CAD (coronary artery disease)   Chronic diastolic heart failure (HCC)   Essential hypertension   History of stroke   Type II diabetes mellitus with renal manifestations (HCC)   Hyperlipidemia   Anemia of chronic renal failure, stage 4 (severe) (HCC)   CKD (chronic kidney disease) stage 4, GFR 15-29 ml/min (HCC)   Gout   Depression   Assessment and Plan: * NSTEMI (non-ST elevated myocardial infarction) ([place]) NSTEMI and hx of CAD: trop 22 --> 24 --> 31 --> 41. Consulted Dr. Nehemiah Massed of card.  - place to tele bedfor observation - Trend Trop - prn Nitroglycerin, Morphine - Aspirin, plavix, Crestor, metoprolol - Risk factor stratification: will check FLP and A1C  - 2d echo - hold Ranex due to worsening renal Fx per card -Continue antianginals with Imdur 240 mg once daily -Continue metoprolol XL       CAD  (coronary artery disease) -see above  Chronic diastolic heart failure ([place]) 2D echo on 12/29/2019 showed EF of 60 to 65%.  Patient does not have leg edema.  BNP 88.  CHF is compensated. -Hold torsemide due to worsening renal function -Watch volume status closely  Essential hypertension - IV hydralazine as needed -Patient is on metoprolol, oral hydralazine, Imdur, -Hold Entresto due to worsening renal function  History of stroke On ASA, plavix and crestor  Type II diabetes mellitus with renal manifestations ([place]) Recent A1c 7.9, poorly controlled.  Patient is a taking Jardiance, NovoLog, Lantus 66 units daily -Sliding scale insulin -Glargine insulin 40 units daily  Hyperlipidemia - Crestor  Anemia of chronic renal failure, stage 4 (severe) (HCC) Hemoglobin 12.9, stable -Follow-up with CBC -Continue iron supplement  CKD (chronic kidney disease) stage 4, GFR 15-29 ml/min (HCC) Recent baseline creatinine 2.04 on 12/25/2021.  His creatinine is 3.57, BUN 76, GFR 16.  Worsening than baseline. -Consulted Dr. Holley Raring of renal -Hold Entresto and torsemide  Gout - Allopurinol  Depression - Continue home medications          DVT ppx: SQ Heparin      Code Status: Full  code  Family Communication: Yes, patient's wife  by phone  Disposition Plan:  Anticipate discharge back to previous environment  Consults called:  Dr. Nehemiah Massed of cardiology and Dr. Holley Raring of renal are consulted.  Admission status and Level of care: Telemetry Cardiac:    for obs  SDU/inpation        Dispo: The patient is from: Home              Anticipated d/c is to: Home              Anticipated d/c date is: 1 day              Patient currently is not medically stable to d/c.    Severity of Illness:  The appropriate patient status for this patient is OBSERVATION. Observation status is judged to be reasonable and necessary in order to provide the required intensity of service to ensure the  patient's safety. The patient's presenting symptoms, physical exam findings, and initial radiographic and laboratory data in the context of their medical condition is felt to place them at decreased risk for further clinical deterioration. Furthermore, it is anticipated that the patient will be medically stable for discharge from the hospital within 2 midnights of admission.        Date of Service 08/15/2022    [place] [facility]   If 7PM-7AM, please contact night-coverage [email or web address] 08/15/2022, 7:06 PM

## 2022-08-15 NOTE — ED Provider Notes (Signed)
[facility] Provider Note    Event Date/Time   First MD Initiated Contact with Patient 08/15/22 0403     (approximate)   History   Chest Pain   HPI  Mike Decker is a 81 y.o. male who presents to the ED for evaluation of Chest Pain   I reviewed cardiology clinic visit from 6/29.  History of CAD s/p PCI, CHF, PAD, CKD, COPD and OSA.  DAPT with Plavix.  Metabolic syndrome.  Patient presents from home by EMS for evaluation of chest pain.  Reports pain over the past 24 hours, worsening tonight.  Improved with prehospital nitroglycerin.  Denies dyspnea or other complaints.  Reports he had a similar episode of pain last night, about 24 hours ago, but did not have any pain during the day on Tuesday.  Physical Exam   Triage Vital Signs: ED Triage Vitals  Enc Vitals Group     BP      Pulse      Resp      Temp      Temp src      SpO2      Weight      Height      Head Circumference      Peak Flow      Pain Score      Pain Loc      Pain Edu?      Excl. in [place]?     Most recent vital signs: Vitals:   08/15/22 0530 08/15/22 0630  BP: (!) 175/74 (!) 189/72  Pulse: 62 63  Resp: 11 14  Temp:    SpO2: 96% 97%    General: Awake, no distress.  Hard of hearing. CV:  Good peripheral perfusion.  Resp:  Normal effort.  Abd:  No distention.  MSK:  No deformity noted.  No peripheral edema Neuro:  No focal deficits appreciated. Other:     ED Results / Procedures / Treatments   Labs (all labs ordered are listed, but only abnormal results are displayed) Labs Reviewed  COMPREHENSIVE METABOLIC PANEL - Abnormal; Notable for the following components:      Result Value   BUN 76 (*)    Creatinine, Ser 3.57 (*)    GFR, Estimated 16 (*)    All other components within normal limits  CBC WITH DIFFERENTIAL/PLATELET - Abnormal; Notable for the following components:   RBC 4.21 (*)    Hemoglobin 12.9 (*)    Monocytes Absolute 1.2 (*)    Eosinophils  Absolute 0.7 (*)    Abs Immature Granulocytes 0.09 (*)    All other components within normal limits  MAGNESIUM - Abnormal; Notable for the following components:   Magnesium 2.6 (*)    All other components within normal limits  TROPONIN I (HIGH SENSITIVITY) - Abnormal; Notable for the following components:   Troponin I (High Sensitivity) 22 (*)    All other components within normal limits  TROPONIN I (HIGH SENSITIVITY) - Abnormal; Notable for the following components:   Troponin I (High Sensitivity) 24 (*)    All other components within normal limits  BRAIN NATRIURETIC PEPTIDE    EKG Sinus rhythm with a rate of 70 bpm.  Normal axis and intervals.  No clear signs of acute ischemia.  RADIOLOGY CXR interpreted by me without evidence of acute cardiopulmonary pathology.  Official radiology report(s): DG Chest Portable 1 View  Result Date: 08/15/2022 CLINICAL DATA:  Chest pain.  No improvement with sublingual nitro.  EXAM: PORTABLE CHEST 1 VIEW COMPARISON:  PA Lat 09/22/2020. FINDINGS: The heart size and mediastinal contours are within normal limits with mild aortic atherosclerosis. Both lungs are clear. The visualized skeletal structures are unremarkable. IMPRESSION: No radiographic evidence of acute chest disease. Electronically Signed   By: Telford Nab M.D.   On: 08/15/2022 04:46    PROCEDURES and INTERVENTIONS:  .1-3 Lead EKG Interpretation  Performed by: Vladimir Crofts, MD Authorized by: Vladimir Crofts, MD     Interpretation: normal     ECG rate:  66   ECG rate assessment: normal     Rhythm: sinus rhythm     Ectopy: none     Conduction: normal     Medications  morphine (PF) 4 MG/ML injection 4 mg (4 mg Intravenous Given 08/15/22 0515)     IMPRESSION / MDM / ASSESSMENT AND PLAN / ED COURSE  I reviewed the triage vital signs and the nursing notes.  Differential diagnosis includes, but is not limited to, ACS, PTX, PNA, muscle strain/spasm, PE, dissection  {Patient presents  with symptoms of an acute illness or injury that is potentially life-threatening.  81 year old male with extensive cardiac history presents with chest pain.  His EKG is nonischemic.  No hematologic derangements.  Metabolic panel with AKI on CKD.  Troponins are slightly elevated, but flat on repeat.  His pain persists for most of his time in the ER, but does improve with morphine.  We will consult medicine for observation admission.      FINAL CLINICAL IMPRESSION(S) / ED DIAGNOSES   Final diagnoses:  Other chest pain     Rx / DC Orders   ED Discharge Orders     None        Note:  This document was prepared using Dragon voice recognition software and may include unintentional dictation errors.   Vladimir Crofts, MD 08/15/22 [phone]

## 2022-08-15 NOTE — Consult Note (Addendum)
[name] NOTE       Patient ID: Mike Decker MRN: [id number] DOB/AGE: [birth year] 82 y.o.  Admit date: 08/15/2022 Referring Physician Dr. Blaine Hamper Primary Physician Dr. Cletis [place] Primary Cardiologist Dr. 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  Cardiology is consulted for further assistance.  Patient states the past 2 nights he is woken up from his sleep with chest pressure, relieved after 3 nitroglycerin.  The past 2 nights of the first time he has had chest pain in the past 6 months after Dr. Baron Hamper increased his dose of isosorbide from 120 mg to 240.  He occasionally has some chest discomfort with exertion, relieved by rest.  He has occasional palpitations and chronic two-pillow orthopnea, but denies shortness of breath, peripheral edema.  His torsemide was increased from 40 mg once a day to 60 mg once a day 2 months ago at his most recent cardiology visit.  At worst he says his chest pain was about a 7 out of 10, and is currently about a 5 out of 10 (has not received as home isosorbide or Ranexa yet as he is awaiting pharmacy med rec).  He was told by Dr. Ronnald Ramp at his recent visit in May that his combination of aortic stenosis, lung disease, and CKD are likely contributory to his dyspnea and chest pain.  Former smoker while he was in [employer], 2 to 3 packs/day, quit at least 30 years ago.  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  Vitals are notable for blood pressure of 129/55, heart rate 68 in sinus rhythm on telemetry.  SPO2 94% on room  air.  Labs are notable for potassium 3.7, BUN/creatinine 76/3.57 and GFR of 16, in May 2023 his creatinine was 2.71 and GFR was 23.  BNP negative at 88, high-sensitivity troponin minimally elevated with a flat trend at 22-24-31, H&H stable at 12.9/38.  Platelets 261.  Chest x-ray without evidence of acute disease.  Review of systems complete and found to be negative unless listed above     Past Medical History:  Diagnosis Date   Anemia    Arthritis    Atrioventricular canal (AVC)    irregular heart beats   Barrett esophagus    Bronchiolitis    Cancer ([place]) 2002   prostate, esphageal   Chronic diastolic CHF (congestive heart failure) (HCC)    Colon polyp    Diabetes mellitus without complication ([place])    Diverticulosis    Gout    Heart disease    Hemangioma    liver   Hyperlipidemia    Hypertension    Myocardial infarct [facility])    Ocular hypertension    Peripheral vascular disease ([place])    Skin cancer    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[place])    Sleep apnea    Vitreoretinal degeneration     Past Surgical History:  Procedure Laterality Date   CATARACT EXTRACTION  2011, 2012   COLONOSCOPY N/A 12/14/2018   Procedure: COLONOSCOPY;  Surgeon: Virgel Manifold, MD;  Location: [facility] ENDOSCOPY;  Service: Endoscopy;  Laterality: N/A;   CORONARY ANGIOPLASTY WITH STENT PLACEMENT  2012   CORONARY STENT INTERVENTION N/A 12/29/2019   Procedure: CORONARY STENT INTERVENTION;  Surgeon: Nelva Bush, MD;  Location: [place] CV LAB;  Service: Cardiovascular;  Laterality: N/A;   ESOPHAGOGASTRODUODENOSCOPY N/A 12/14/2018   Procedure: ESOPHAGOGASTRODUODENOSCOPY (EGD);  Surgeon: Virgel Manifold, MD;  Location: [facility] ENDOSCOPY;  Service: Endoscopy;  Laterality: N/A;   ESOPHAGOGASTRODUODENOSCOPY (EGD) WITH PROPOFOL N/A 06/01/2016   Procedure: ESOPHAGOGASTRODUODENOSCOPY (EGD) WITH PROPOFOL;  Surgeon: Manya Silvas, MD;  Location: [facility] ENDOSCOPY;  Service: Endoscopy;  Laterality: N/A;   HERNIA REPAIR      x2   INTRAOCULAR LENS INSERTION     LEFT HEART CATH AND CORONARY ANGIOGRAPHY N/A 05/09/2017   Procedure: Left Heart Cath and Coronary Angiography;  Surgeon: Yolonda Kida, MD;  Location: [place] CV LAB;  Service: Cardiovascular;  Laterality: N/A;   LEFT HEART CATH AND CORONARY ANGIOGRAPHY N/A 12/08/2018   Procedure: LEFT HEART CATH AND CORONARY ANGIOGRAPHY and possible PCI and stent;  Surgeon: Yolonda Kida, MD;  Location: [place] CV LAB;  Service: Cardiovascular;  Laterality: N/A;   LEFT HEART CATH AND CORONARY ANGIOGRAPHY N/A 12/29/2019   Procedure: LEFT HEART CATH AND CORONARY ANGIOGRAPHY;  Surgeon: Nelva Bush, MD;  Location: [place] CV LAB;  Service: Cardiovascular;  Laterality: N/A;   NOSE SURGERY     submucous resection   PROSTATE SURGERY  2002   ROTATOR CUFF REPAIR Right     (Not in a hospital admission)  Social History   Socioeconomic History   Marital status: Married    Spouse name: Not on file   Number of children: 2   Years of education: College 3 years   Highest education level: Associate degree: occupational, Hotel manager, or vocational program  Occupational History   Occupation: retired  Tobacco Use   Smoking status: Former    Years: 20.00    Types: Cigarettes    Quit date: 12/10/1976    Years since quitting: 45.7   Smokeless tobacco: Never  Substance and Sexual Activity   Alcohol use: No   Drug use: No   Sexual activity: Not Currently  Other Topics Concern   Not on file  Social History Narrative   Not on file   Social Determinants of Health   Financial Resource Strain: Low Risk  (09/15/2021)   Overall Financial Resource Strain (CARDIA)    Difficulty of Paying Living Expenses: Not very hard  Food Insecurity: No Food Insecurity (09/15/2021)   Hunger Vital Sign    Worried About Running Out of Food in the Last Year: Never true    [place] in the Last Year: Never true  Transportation Needs: No Transportation Needs (09/15/2021)    PRAPARE - Hydrologist (Medical): No    Lack of Transportation (Non-Medical): No  Physical Activity: Insufficiently Active (09/15/2021)   Exercise Vital Sign    Days of Exercise per Week: 7 days    Minutes of Exercise per Session: 20 min  Stress: No Stress Concern Present (09/15/2021)   [employer]    Feeling of Stress : Not at all  Social Connections: Moderately Isolated (09/15/2021)   Social Connection and Isolation Panel [NHANES]    Frequency of Communication with Friends and Family: Once a week    Frequency of Social Gatherings with Friends and Family: Once a week    Attends Religious Services: More than 4  times per year    Active Member of Clubs or Organizations: No    Attends Archivist Meetings: Never    Marital Status: Married  Human resources officer Violence: Not At Risk (09/15/2021)   Humiliation, Afraid, Rape, and Kick questionnaire    Fear of Current or Ex-Partner: No    Emotionally Abused: No    Physically Abused: No    Sexually Abused: No    Family History  Problem Relation Age of Onset   Arthritis Mother    Stroke Maternal Grandfather    Breast cancer Neg Hx       PHYSICAL EXAM General: Pleasant elderly Caucasian male, well nourished, in no acute distress.  Sitting upright in ED stretcher. HEENT:  Normocephalic and atraumatic. Neck:  No JVD.  Cannot appreciate carotid bruit, possibly due to body habitus. Lungs: Normal respiratory effort on room air.  Decreased breath sounds bilaterally without appreciable crackles or wheezes  heart: HRRR . Normal S1 and S2.  3/6 systolic murmurs.  Best heard at the RUSB. Abdomen: Obese appearing.  Msk: Normal strength and tone for age. Extremities: Warm and well perfused. No clubbing, cyanosis.  No peripheral edema.  Neuro: Alert and oriented X 3. Psych:  Answers questions appropriately.   Labs: Basic Metabolic Panel: Recent  Labs    08/15/22 0408  NA 137  K 3.7  CL 100  CO2 24  GLUCOSE 97  BUN 76*  CREATININE 3.57*  CALCIUM 9.4  MG 2.6*   Liver Function Tests: Recent Labs    08/15/22 0408  AST 16  ALT 13  ALKPHOS 65  BILITOT 0.8  PROT 7.9  ALBUMIN 4.3   No results for input(s): "LIPASE", "AMYLASE" in the last 72 hours. CBC: Recent Labs    08/15/22 0408  WBC 9.8  NEUTROABS 6.4  HGB 12.9*  HCT 39.8  MCV 94.5  PLT 261   Cardiac Enzymes: Recent Labs    08/15/22 0408 08/15/22 0606 08/15/22 0754  TROPONINIHS 22* 24* 31*   BNP: Invalid input(s): "POCBNP" D-Dimer: No results for input(s): "DDIMER" in the last 72 hours. Hemoglobin A1C: No results for input(s): "HGBA1C" in the last 72 hours. Fasting Lipid Panel: No results for input(s): "CHOL", "HDL", "LDLCALC", "TRIG", "CHOLHDL", "LDLDIRECT" in the last 72 hours. Thyroid Function Tests: No results for input(s): "TSH", "T4TOTAL", "T3FREE", "THYROIDAB" in the last 72 hours.  Invalid input(s): "FREET3" Anemia Panel: No results for input(s): "VITAMINB12", "FOLATE", "FERRITIN", "TIBC", "IRON", "RETICCTPCT" in the last 72 hours.  DG Chest Portable 1 View  Result Date: 08/15/2022 CLINICAL DATA:  Chest pain.  No improvement with sublingual nitro. EXAM: PORTABLE CHEST 1 VIEW COMPARISON:  PA Lat 09/22/2020. FINDINGS: The heart size and mediastinal contours are within normal limits with mild aortic atherosclerosis. Both lungs are clear. The visualized skeletal structures are unremarkable. IMPRESSION: No radiographic evidence of acute chest disease. Electronically Signed   By: Telford Nab M.D.   On: 08/15/2022 04:46     Radiology: DG Chest Portable 1 View  Result Date: 08/15/2022 CLINICAL DATA:  Chest pain.  No improvement with sublingual nitro. EXAM: PORTABLE CHEST 1 VIEW COMPARISON:  PA Lat 09/22/2020. FINDINGS: The heart size and mediastinal contours are within normal limits with mild aortic atherosclerosis. Both lungs are clear. The  visualized skeletal structures are unremarkable. IMPRESSION: No radiographic evidence of acute chest disease. Electronically Signed   By: Telford Nab M.D.   On: 08/15/2022 04:46    ECHO 01/18/2022 normal stress echo without wall motion abnormalities  at rest and peak stress.  Moderate aortic stenosis with mean gradient of 20 mmhg on rest echo.  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  Cardiology is consulted for further assistance.  #Chest pain #Moderate aortic stenosis #CAD s/p PCI to proximal RCA, proximal Lcx Presents with 7/10 chest pain that woke him out of his sleep the past 2 nights, relieved with sublingual nitroglycerin, and morphine in the ED.  Occasionally has exertional chest pain relieved with rest.  Most recent echo from 7 months ago revealed moderate aortic stenosis with mean gradient 20 mmHg, for which annual follow-up was recommended.  His most recent [facility] 02/2021 showed patent stents, jailed OM 2 for which aggressive medical therapy was recommended.  He has been on dual antiplatelet therapy.  His troponins are minimally elevated with a flat trend at 22-24-31, less likely ACS, more consistent with kidney dysfunction as below.  Suspect his aortic valve may be contributing to his symptoms. -S/p 325 mg aspirin, continue DAPT with 81 mg aspirin  daily, Plavix 75 mg daily -Continue antianginals with Imdur 240 mg once daily -Continue metoprolol XL  -Continue Crestor '10mg'$  once daily -Hold Ranexa with AKI, restart as able -Repeat echocardiogram complete to assess his aortic valve  #AKI on CKD4 Concern for overdiuresis, nephrology following, appreciate recommendations. -Agree with holding home torsemide 60 mg daily  #Chronic HFpEF BNP negative at 88.  Appears clinically dry on exam.  This patient's plan of care was discussed and created with Dr. Nehemiah Massed and he is in agreement.  Signed: Tristan Schroeder , PA-C 08/15/2022, 9:52 AM [facility]  The patient has had typical anginal symptoms multiple times in the last few days requiring nitroglycerin sublingual.  This is consistent with possible coronary artery angina although he has had a recent cardiac catheterization in the last year showing patent stents of right coronary artery circumflex artery and has had increase in antianginal medication management.  Currently there is no evidence of myocardial infarction with troponin of 21/24 and recent echocardiogram has shown moderate aortic stenosis with no evidence of significant progression.  Currently he will not be able to have further diagnostic testing from the cardiovascular standpoint due to his significant chronic kidney disease stage IV which is slightly worsened and may need hydration.  I have personally reviewed cardiac catheterization echocardiogram telemetry chest x-ray laboratory work and EKG.  The patient has been interviewed and examined. I agree with assessment and plan above. Serafina Royals MD [facility]

## 2022-08-15 NOTE — Progress Notes (Signed)
*  PRELIMINARY RESULTS* Echocardiogram 2D Echocardiogram has been performed.  Mike Decker 08/15/2022, 2:32 PM

## 2022-08-15 NOTE — Assessment & Plan Note (Signed)
Recent baseline creatinine 2.04 on 12/25/2021.  His creatinine is 3.57, BUN 76, GFR 16.  Worsening than baseline. -Consulted Dr. Holley Raring of renal -Hold Entresto and torsemide

## 2022-08-15 NOTE — Assessment & Plan Note (Signed)
On ASA, plavix and crestor

## 2022-08-16 ENCOUNTER — Ambulatory Visit: Payer: Medicare Other | Admitting: Physician Assistant

## 2022-08-16 ENCOUNTER — Other Ambulatory Visit: Payer: Self-pay

## 2022-08-16 DIAGNOSIS — I10 Essential (primary) hypertension: Secondary | ICD-10-CM | POA: Diagnosis not present

## 2022-08-16 DIAGNOSIS — J449 Chronic obstructive pulmonary disease, unspecified: Secondary | ICD-10-CM | POA: Diagnosis present

## 2022-08-16 DIAGNOSIS — N179 Acute kidney failure, unspecified: Secondary | ICD-10-CM | POA: Diagnosis present

## 2022-08-16 DIAGNOSIS — N184 Chronic kidney disease, stage 4 (severe): Secondary | ICD-10-CM | POA: Diagnosis present

## 2022-08-16 DIAGNOSIS — Z6832 Body mass index (BMI) 32.0-32.9, adult: Secondary | ICD-10-CM | POA: Diagnosis not present

## 2022-08-16 DIAGNOSIS — E1122 Type 2 diabetes mellitus with diabetic chronic kidney disease: Secondary | ICD-10-CM | POA: Diagnosis present

## 2022-08-16 DIAGNOSIS — I25118 Atherosclerotic heart disease of native coronary artery with other forms of angina pectoris: Secondary | ICD-10-CM | POA: Diagnosis present

## 2022-08-16 DIAGNOSIS — I129 Hypertensive chronic kidney disease with stage 1 through stage 4 chronic kidney disease, or unspecified chronic kidney disease: Secondary | ICD-10-CM | POA: Diagnosis not present

## 2022-08-16 DIAGNOSIS — I248 Other forms of acute ischemic heart disease: Secondary | ICD-10-CM | POA: Diagnosis present

## 2022-08-16 DIAGNOSIS — Z794 Long term (current) use of insulin: Secondary | ICD-10-CM | POA: Diagnosis not present

## 2022-08-16 DIAGNOSIS — Z8673 Personal history of transient ischemic attack (TIA), and cerebral infarction without residual deficits: Secondary | ICD-10-CM | POA: Diagnosis not present

## 2022-08-16 DIAGNOSIS — E1165 Type 2 diabetes mellitus with hyperglycemia: Secondary | ICD-10-CM | POA: Diagnosis present

## 2022-08-16 DIAGNOSIS — E669 Obesity, unspecified: Secondary | ICD-10-CM | POA: Diagnosis present

## 2022-08-16 DIAGNOSIS — E1151 Type 2 diabetes mellitus with diabetic peripheral angiopathy without gangrene: Secondary | ICD-10-CM | POA: Diagnosis present

## 2022-08-16 DIAGNOSIS — D631 Anemia in chronic kidney disease: Secondary | ICD-10-CM | POA: Diagnosis present

## 2022-08-16 DIAGNOSIS — I2510 Atherosclerotic heart disease of native coronary artery without angina pectoris: Secondary | ICD-10-CM | POA: Diagnosis present

## 2022-08-16 DIAGNOSIS — I25119 Atherosclerotic heart disease of native coronary artery with unspecified angina pectoris: Secondary | ICD-10-CM | POA: Diagnosis not present

## 2022-08-16 DIAGNOSIS — E861 Hypovolemia: Secondary | ICD-10-CM | POA: Diagnosis present

## 2022-08-16 DIAGNOSIS — I209 Angina pectoris, unspecified: Secondary | ICD-10-CM | POA: Diagnosis not present

## 2022-08-16 DIAGNOSIS — E785 Hyperlipidemia, unspecified: Secondary | ICD-10-CM | POA: Diagnosis present

## 2022-08-16 DIAGNOSIS — R079 Chest pain, unspecified: Secondary | ICD-10-CM

## 2022-08-16 DIAGNOSIS — I350 Nonrheumatic aortic (valve) stenosis: Secondary | ICD-10-CM | POA: Diagnosis present

## 2022-08-16 DIAGNOSIS — R0789 Other chest pain: Secondary | ICD-10-CM | POA: Diagnosis present

## 2022-08-16 DIAGNOSIS — I130 Hypertensive heart and chronic kidney disease with heart failure and stage 1 through stage 4 chronic kidney disease, or unspecified chronic kidney disease: Secondary | ICD-10-CM | POA: Diagnosis present

## 2022-08-16 DIAGNOSIS — F32A Depression, unspecified: Secondary | ICD-10-CM | POA: Diagnosis present

## 2022-08-16 DIAGNOSIS — M109 Gout, unspecified: Secondary | ICD-10-CM | POA: Diagnosis present

## 2022-08-16 DIAGNOSIS — Z880 Allergy status to penicillin: Secondary | ICD-10-CM | POA: Diagnosis not present

## 2022-08-16 DIAGNOSIS — Z91010 Allergy to peanuts: Secondary | ICD-10-CM | POA: Diagnosis not present

## 2022-08-16 DIAGNOSIS — Z888 Allergy status to other drugs, medicaments and biological substances status: Secondary | ICD-10-CM | POA: Diagnosis not present

## 2022-08-16 DIAGNOSIS — I5032 Chronic diastolic (congestive) heart failure: Secondary | ICD-10-CM | POA: Diagnosis present

## 2022-08-16 LAB — LIPID PANEL
Cholesterol: 112 mg/dL (ref 0–200)
HDL: 17 mg/dL — ABNORMAL LOW (ref >40)
LDL Cholesterol: 33 mg/dL (ref 0–99)
Total CHOL/HDL Ratio: 6.6 RATIO
Triglycerides: 311 mg/dL — ABNORMAL HIGH (ref <150)
VLDL: 62 mg/dL — ABNORMAL HIGH (ref 0–40)

## 2022-08-16 LAB — CBG MONITORING, ED
Glucose-Capillary: 125 mg/dL — ABNORMAL HIGH (ref 70–99)
Glucose-Capillary: 186 mg/dL — ABNORMAL HIGH (ref 70–99)

## 2022-08-16 LAB — BASIC METABOLIC PANEL
Anion gap: 7 (ref 5–15)
BUN: 55 mg/dL — ABNORMAL HIGH (ref 8–23)
CO2: 20 mmol/L — ABNORMAL LOW (ref 22–32)
Calcium: 7.5 mg/dL — ABNORMAL LOW (ref 8.9–10.3)
Chloride: 113 mmol/L — ABNORMAL HIGH (ref 98–111)
Creatinine, Ser: 2.32 mg/dL — ABNORMAL HIGH (ref 0.61–1.24)
GFR, Estimated: 27 mL/min — ABNORMAL LOW (ref >60)
Glucose, Bld: 108 mg/dL — ABNORMAL HIGH (ref 70–99)
Potassium: 3.6 mmol/L (ref 3.5–5.1)
Sodium: 140 mmol/L (ref 135–145)

## 2022-08-16 LAB — ECHOCARDIOGRAM COMPLETE
AR max vel: 1.39 cm2
AV Area VTI: 1.29 cm2
AV Area mean vel: 1.44 cm2
AV Mean grad: 19 mmHg
AV Peak grad: 35 mmHg
Ao pk vel: 2.96 m/s
Area-P 1/2: 3.68 cm2
Height: 66 in
S' Lateral: 2.69 cm
Weight: 3262.81 oz

## 2022-08-16 LAB — GLUCOSE, CAPILLARY
Glucose-Capillary: 216 mg/dL — ABNORMAL HIGH (ref 70–99)
Glucose-Capillary: 228 mg/dL — ABNORMAL HIGH (ref 70–99)

## 2022-08-16 NOTE — Progress Notes (Signed)
[place] NOTE       Patient ID: Mike Decker MRN: [id number] DOB/AGE: 1941/04/19 81 y.o.  Admit date: 08/15/2022 Referring Physician Dr. Blaine Hamper Primary Physician Dr. Cletis [place] Primary Cardiologist Dr. 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  Cardiology is consulted for further assistance.  Interval History: - Reports intermittent chest pain at rest, about a 4/10, self terminating.  No shortness of breath, palpitations, no other complaints. - renal function improving with IV fluids  - echo resulted with preserved EF, mild to mod aortic stenosis (similar to prior study form 01/2022)    Review of systems complete and found to be negative unless listed above     Past Medical History:  Diagnosis Date   Anemia    Arthritis    Atrioventricular canal (AVC)    irregular heart beats   Barrett esophagus    Bronchiolitis    Cancer ([place]) 2002   prostate, esphageal   Chronic diastolic CHF (congestive heart failure) ([place])    Colon polyp    Diabetes mellitus without complication ([place])    Diverticulosis    Gout    Heart disease    Hemangioma    liver   Hyperlipidemia    Hypertension    Myocardial infarct [facility])    Ocular hypertension    Peripheral vascular disease ([place])    Skin cancer    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[place])    Sleep apnea    Vitreoretinal degeneration     Past Surgical History:  Procedure Laterality Date   CATARACT EXTRACTION  2011, 2012   COLONOSCOPY N/A 12/14/2018   Procedure: COLONOSCOPY;  Surgeon: Virgel Manifold, MD;  Location:  [facility] ENDOSCOPY;  Service: Endoscopy;  Laterality: N/A;   CORONARY ANGIOPLASTY WITH STENT PLACEMENT  2012   CORONARY STENT INTERVENTION N/A 12/29/2019   Procedure: CORONARY STENT INTERVENTION;  Surgeon: Nelva Bush, MD;  Location: [place] CV LAB;  Service: Cardiovascular;  Laterality: N/A;   ESOPHAGOGASTRODUODENOSCOPY N/A 12/14/2018   Procedure: ESOPHAGOGASTRODUODENOSCOPY (EGD);  Surgeon: Virgel Manifold, MD;  Location: [facility] ENDOSCOPY;  Service: Endoscopy;  Laterality: N/A;   ESOPHAGOGASTRODUODENOSCOPY (EGD) WITH PROPOFOL N/A 06/01/2016   Procedure: ESOPHAGOGASTRODUODENOSCOPY (EGD) WITH PROPOFOL;  Surgeon: Manya Silvas, MD;  Location: [facility] ENDOSCOPY;  Service: Endoscopy;  Laterality: N/A;   HERNIA REPAIR     x2   INTRAOCULAR LENS INSERTION     LEFT HEART CATH AND CORONARY ANGIOGRAPHY N/A 05/09/2017   Procedure: Left Heart Cath and Coronary Angiography;  Surgeon: Yolonda Kida, MD;  Location: [place] CV LAB;  Service: Cardiovascular;  Laterality: N/A;   LEFT HEART CATH AND CORONARY ANGIOGRAPHY N/A 12/08/2018   Procedure: LEFT HEART CATH AND CORONARY ANGIOGRAPHY and possible PCI and stent;  Surgeon: Yolonda Kida, MD;  Location: [place] CV LAB;  Service: Cardiovascular;  Laterality: N/A;   LEFT HEART CATH AND CORONARY ANGIOGRAPHY N/A 12/29/2019   Procedure: LEFT HEART CATH AND CORONARY ANGIOGRAPHY;  Surgeon: Nelva Bush, MD;  Location: [place] CV LAB;  Service: Cardiovascular;  Laterality: N/A;   NOSE  SURGERY     submucous resection   PROSTATE SURGERY  2002   ROTATOR CUFF REPAIR Right     (Not in a hospital admission)  Social History   Socioeconomic History   Marital status: Married    Spouse name: Not on file   Number of children: 2   Years of education: College 3 years   Highest education level: Associate degree: occupational, Hotel manager, or vocational program  Occupational History   Occupation: retired  Tobacco Use   Smoking status:  Former    Years: 20.00    Types: Cigarettes    Quit date: 12/10/1976    Years since quitting: 45.7   Smokeless tobacco: Never  Substance and Sexual Activity   Alcohol use: No   Drug use: No   Sexual activity: Not Currently  Other Topics Concern   Not on file  Social History Narrative   Not on file   Social Determinants of Health   Financial Resource Strain: Low Risk  (09/15/2021)   Overall Financial Resource Strain (CARDIA)    Difficulty of Paying Living Expenses: Not very hard  Food Insecurity: No Food Insecurity (09/15/2021)   Hunger Vital Sign    Worried About Running Out of Food in the Last Year: Never true    Ran Out of Food in the Last Year: Never true  Transportation Needs: No Transportation Needs (09/15/2021)   PRAPARE - Hydrologist (Medical): No    Lack of Transportation (Non-Medical): No  Physical Activity: Insufficiently Active (09/15/2021)   Exercise Vital Sign    Days of Exercise per Week: 7 days    Minutes of Exercise per Session: 20 min  Stress: No Stress Concern Present (09/15/2021)   [place]    Feeling of Stress : Not at all  Social Connections: Moderately Isolated (09/15/2021)   Social Connection and Isolation Panel [NHANES]    Frequency of Communication with Friends and Family: Once a week    Frequency of Social Gatherings with Friends and Family: Once a week    Attends Religious Services: More than 4 times per year    Active Member of [employer] or Organizations: No    Attends Archivist Meetings: Never    Marital Status: Married  Human resources officer Violence: Not At Risk (09/15/2021)   Humiliation, Afraid, Rape, and Kick questionnaire    Fear of Current or Ex-Partner: No    Emotionally Abused: No    Physically Abused: No    Sexually Abused: No    Family History  Problem Relation Age of Onset   Arthritis Mother    Stroke Maternal Grandfather    Breast  cancer Neg Hx       PHYSICAL EXAM General: Pleasant elderly Caucasian male, well nourished, in no acute distress.  Sitting in incline in ED stretcher with wife at bedside HEENT:  Normocephalic and atraumatic.  Somewhat hard of hearing Neck:  No JVD.   Lungs: Normal respiratory effort on room air.  Decreased breath sounds bilaterally without appreciable crackles or wheezes  heart: HRRR . Normal S1 and S2.  3/6 systolic murmurs.  Best heard at the RUSB. Abdomen: Obese appearing.  Msk: Normal strength and tone for age. Extremities: Warm and well perfused. No clubbing, cyanosis.  No peripheral edema.  Neuro: Alert and oriented X 3. Psych:  Answers questions appropriately.   Labs: Basic Metabolic Panel: Recent Labs    08/15/22 0408 08/16/22 0559  NA  137 140  K 3.7 3.6  CL 100 113*  CO2 24 20*  GLUCOSE 97 108*  BUN 76* 55*  CREATININE 3.57* 2.32*  CALCIUM 9.4 7.5*  MG 2.6*  --     Liver Function Tests: Recent Labs    08/15/22 0408  AST 16  ALT 13  ALKPHOS 65  BILITOT 0.8  PROT 7.9  ALBUMIN 4.3    No results for input(s): "LIPASE", "AMYLASE" in the last 72 hours. CBC: Recent Labs    08/15/22 0408  WBC 9.8  NEUTROABS 6.4  HGB 12.9*  HCT 39.8  MCV 94.5  PLT 261    Cardiac Enzymes: Recent Labs    08/15/22 0606 08/15/22 0754 08/15/22 0956  TROPONINIHS 24* 31* 41*    BNP: Invalid input(s): "POCBNP" D-Dimer: No results for input(s): "DDIMER" in the last 72 hours. Hemoglobin A1C: Recent Labs    08/15/22 0956  HGBA1C 7.5*   Fasting Lipid Panel: Recent Labs    08/16/22 0559  CHOL 112  HDL 17*  LDLCALC 33  TRIG 311*  CHOLHDL 6.6   Thyroid Function Tests: No results for input(s): "TSH", "T4TOTAL", "T3FREE", "THYROIDAB" in the last 72 hours.  Invalid input(s): "FREET3" Anemia Panel: No results for input(s): "VITAMINB12", "FOLATE", "FERRITIN", "TIBC", "IRON", "RETICCTPCT" in the last 72 hours.  ECHOCARDIOGRAM COMPLETE  Result Date:  08/16/2022    ECHOCARDIOGRAM REPORT   Patient Name:   Mike Decker Date of Exam: 08/15/2022 Medical Rec #:  [id number]            Height:       66.0 in Accession #:    [id number]           Weight:       203.9 lb Date of Birth:  1941-05-20            BSA:          2.016 m Patient Age:    47 years             BP:           134/65 mmHg Patient Gender: M                    HR:           74 bpm. Exam Location:  [facility] Procedure: 2D Echo, Color Doppler and Cardiac Doppler Indications:     R07.9 Chest Pain  History:         Patient has prior history of Echocardiogram examinations, most                  recent 12/29/2019. CHF, PVD; Risk Factors:Hypertension,                  Dyslipidemia and Sleep Apnea.  Sonographer:     Charmayne Sheer Referring Phys:  [id number] [place] Diagnosing Phys: Serafina Royals MD  Sonographer Comments: Suboptimal parasternal window and no subcostal window. IMPRESSIONS  1. Left ventricular ejection fraction, by estimation, is 55 to 60%. The left ventricle has normal function. The left ventricle has no regional wall motion abnormalities. Left ventricular diastolic parameters were normal.  2. Right ventricular systolic function is normal. The right ventricular size is normal.  3. The mitral valve is normal in structure. Mild mitral valve regurgitation.  4. The aortic valve is calcified. Aortic valve regurgitation is mild to moderate. FINDINGS  Left Ventricle: Left ventricular ejection fraction, by estimation, is 55 to 60%. The left ventricle has  normal function. The left ventricle has no regional wall motion abnormalities. The left ventricular internal cavity size was normal in size. There is  no left ventricular hypertrophy. Left ventricular diastolic parameters were normal. Right Ventricle: The right ventricular size is normal. No increase in right ventricular wall thickness. Right ventricular systolic function is normal. Left Atrium: Left atrial size was normal in size. Right Atrium: Right  atrial size was normal in size. Pericardium: There is no evidence of pericardial effusion. Mitral Valve: The mitral valve is normal in structure. Mild mitral valve regurgitation. Tricuspid Valve: The tricuspid valve is normal in structure. Tricuspid valve regurgitation is mild. Aortic Valve: The aortic valve is calcified. Aortic valve regurgitation is mild to moderate. Aortic valve mean gradient measures 19.0 mmHg. Aortic valve peak gradient measures 35.0 mmHg. Aortic valve area, by VTI measures 1.29 cm. Pulmonic Valve: The pulmonic valve was normal in structure. Pulmonic valve regurgitation is trivial. Aorta: The aortic root and ascending aorta are structurally normal, with no evidence of dilitation. IAS/Shunts: No atrial level shunt detected by color flow Doppler.  LEFT VENTRICLE PLAX 2D LVIDd:         3.75 cm   Diastology LVIDs:         2.69 cm   LV e' medial:    5.66 cm/s LV PW:         1.18 cm   LV E/e' medial:  11.3 LV IVS:        0.90 cm   LV e' lateral:   5.22 cm/s LVOT diam:     2.20 cm   LV E/e' lateral: 12.2 LV SV:         82 LV SV Index:   41 LVOT Area:     3.80 cm  LEFT ATRIUM             Index LA diam:        3.90 cm 1.93 cm/m LA Vol (A2C):   51.5 ml 25.54 ml/m LA Vol (A4C):   50.5 ml 25.04 ml/m LA Biplane Vol: 54.7 ml 27.13 ml/m  AORTIC VALVE                     PULMONIC VALVE AV Area (Vmax):    1.39 cm      PV Vmax:       1.17 m/s AV Area (Vmean):   1.44 cm      PV Peak grad:  5.5 mmHg AV Area (VTI):     1.29 cm AV Vmax:           296.00 cm/s AV Vmean:          207.000 cm/s AV VTI:            0.633 m AV Peak Grad:      35.0 mmHg AV Mean Grad:      19.0 mmHg LVOT Vmax:         108.00 cm/s LVOT Vmean:        78.600 cm/s LVOT VTI:          0.215 m LVOT/AV VTI ratio: 0.34  AORTA Ao Root diam: 3.30 cm MITRAL VALVE MV Area (PHT): 3.68 cm    SHUNTS MV Decel Time: 206 msec    Systemic VTI:  0.22 m MV E velocity: 63.80 cm/s  Systemic Diam: 2.20 cm MV A velocity: 99.40 cm/s MV E/A ratio:  0.64 Serafina Royals MD Electronically signed by Serafina Royals MD Signature Date/Time: 08/16/2022/12:39:05 PM  Final    DG Chest Portable 1 View  Result Date: 08/15/2022 CLINICAL DATA:  Chest pain.  No improvement with sublingual nitro. EXAM: PORTABLE CHEST 1 VIEW COMPARISON:  PA Lat 09/22/2020. FINDINGS: The heart size and mediastinal contours are within normal limits with mild aortic atherosclerosis. Both lungs are clear. The visualized skeletal structures are unremarkable. IMPRESSION: No radiographic evidence of acute chest disease. Electronically Signed   By: Telford Nab M.D.   On: 08/15/2022 04:46     Radiology: ECHOCARDIOGRAM COMPLETE  Result Date: 08/16/2022    ECHOCARDIOGRAM REPORT   Patient Name:   Mike Decker Date of Exam: 08/15/2022 Medical Rec #:  [id number]            Height:       66.0 in Accession #:    [id number]           Weight:       203.9 lb Date of Birth:  1941-05-08            BSA:          2.016 m Patient Age:    75 years             BP:           134/65 mmHg Patient Gender: M                    HR:           74 bpm. Exam Location:  [facility] Procedure: 2D Echo, Color Doppler and Cardiac Doppler Indications:     R07.9 Chest Pain  History:         Patient has prior history of Echocardiogram examinations, most                  recent 12/29/2019. CHF, PVD; Risk Factors:Hypertension,                  Dyslipidemia and Sleep Apnea.  Sonographer:     Charmayne Sheer Referring Phys:  [id number] [place] Krrish Freund Diagnosing Phys: Serafina Royals MD  Sonographer Comments: Suboptimal parasternal window and no subcostal window. IMPRESSIONS  1. Left ventricular ejection fraction, by estimation, is 55 to 60%. The left ventricle has normal function. The left ventricle has no regional wall motion abnormalities. Left ventricular diastolic parameters were normal.  2. Right ventricular systolic function is normal. The right ventricular size is normal.  3. The mitral valve is normal in structure. Mild mitral valve  regurgitation.  4. The aortic valve is calcified. Aortic valve regurgitation is mild to moderate. FINDINGS  Left Ventricle: Left ventricular ejection fraction, by estimation, is 55 to 60%. The left ventricle has normal function. The left ventricle has no regional wall motion abnormalities. The left ventricular internal cavity size was normal in size. There is  no left ventricular hypertrophy. Left ventricular diastolic parameters were normal. Right Ventricle: The right ventricular size is normal. No increase in right ventricular wall thickness. Right ventricular systolic function is normal. Left Atrium: Left atrial size was normal in size. Right Atrium: Right atrial size was normal in size. Pericardium: There is no evidence of pericardial effusion. Mitral Valve: The mitral valve is normal in structure. Mild mitral valve regurgitation. Tricuspid Valve: The tricuspid valve is normal in structure. Tricuspid valve regurgitation is mild. Aortic Valve: The aortic valve is calcified. Aortic valve regurgitation is mild to moderate. Aortic valve mean gradient measures 19.0 mmHg. Aortic valve peak gradient measures 35.0 mmHg. Aortic valve area,  by VTI measures 1.29 cm. Pulmonic Valve: The pulmonic valve was normal in structure. Pulmonic valve regurgitation is trivial. Aorta: The aortic root and ascending aorta are structurally normal, with no evidence of dilitation. IAS/Shunts: No atrial level shunt detected by color flow Doppler.  LEFT VENTRICLE PLAX 2D LVIDd:         3.75 cm   Diastology LVIDs:         2.69 cm   LV e' medial:    5.66 cm/s LV PW:         1.18 cm   LV E/e' medial:  11.3 LV IVS:        0.90 cm   LV e' lateral:   5.22 cm/s LVOT diam:     2.20 cm   LV E/e' lateral: 12.2 LV SV:         82 LV SV Index:   41 LVOT Area:     3.80 cm  LEFT ATRIUM             Index LA diam:        3.90 cm 1.93 cm/m LA Vol (A2C):   51.5 ml 25.54 ml/m LA Vol (A4C):   50.5 ml 25.04 ml/m LA Biplane Vol: 54.7 ml 27.13 ml/m  AORTIC  VALVE                     PULMONIC VALVE AV Area (Vmax):    1.39 cm      PV Vmax:       1.17 m/s AV Area (Vmean):   1.44 cm      PV Peak grad:  5.5 mmHg AV Area (VTI):     1.29 cm AV Vmax:           296.00 cm/s AV Vmean:          207.000 cm/s AV VTI:            0.633 m AV Peak Grad:      35.0 mmHg AV Mean Grad:      19.0 mmHg LVOT Vmax:         108.00 cm/s LVOT Vmean:        78.600 cm/s LVOT VTI:          0.215 m LVOT/AV VTI ratio: 0.34  AORTA Ao Root diam: 3.30 cm MITRAL VALVE MV Area (PHT): 3.68 cm    SHUNTS MV Decel Time: 206 msec    Systemic VTI:  0.22 m MV E velocity: 63.80 cm/s  Systemic Diam: 2.20 cm MV A velocity: 99.40 cm/s MV E/A ratio:  0.64 Serafina Royals MD Electronically signed by Serafina Royals MD Signature Date/Time: 08/16/2022/12:39:05 PM    Final    DG Chest Portable 1 View  Result Date: 08/15/2022 CLINICAL DATA:  Chest pain.  No improvement with sublingual nitro. EXAM: PORTABLE CHEST 1 VIEW COMPARISON:  PA Lat 09/22/2020. FINDINGS: The heart size and mediastinal contours are within normal limits with mild aortic atherosclerosis. Both lungs are clear. The visualized skeletal structures are unremarkable. IMPRESSION: No radiographic evidence of acute chest disease. Electronically Signed   By: Telford Nab M.D.   On: 08/15/2022 04:46    ECHO 01/18/2022 normal stress echo without wall motion abnormalities at rest and peak stress.  Moderate aortic stenosis with mean gradient of 20 mmhg on rest echo.  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  Cardiology is consulted for further assistance.  #Chest pain #Moderate aortic stenosis #CAD s/p PCI to proximal RCA, proximal Lcx Presents with 7/10 chest pain that woke him out of his sleep the past 2 nights, relieved with sublingual nitroglycerin, and morphine in the ED.  Occasionally has exertional chest pain relieved with rest.  Most recent echo from 7 months ago revealed moderate aortic stenosis with mean gradient 20 mmHg, for which annual follow-up was recommended.  His most recent [facility] 02/2021 showed patent stents, jailed OM 2 for which aggressive medical therapy was recommended.  He has been on dual antiplatelet therapy.  His troponins are minimally elevated with a flat trend at 22-24-31, less likely ACS, more consistent with kidney dysfunction as below.  Suspect his aortic valve may be contributing to his symptoms. -S/p 325 mg aspirin, continue DAPT with 81 mg aspirin daily, Plavix 75 mg daily -Continue antianginals with Imdur 240 mg once daily -Continue metoprolol XL  -Continue Crestor '10mg'$  once daily, goal to increase as renal function improves (TC 112, HDL 17, LDL 33, triglycerides 311, VLDL 62) consider addition of fenofibrate  -Hold Ranexa with AKI, restart as able -Echocardiogram complete resulted with similar mild to moderate aortic stenosis compared to prior study from 01/2022 (mean gradient 19 mmHg)  #AKI on CKD4 Concern for overdiuresis, nephrology following, appreciate recommendations. -Continue to hold torsemide.  Dosing per nephrology.  #Chronic HFpEF BNP negative at 88.  Still appears clinically dry on exam. -monitor closely with IVF   This patient's plan of care was discussed and created with Dr. Nehemiah Massed and he is in agreement.  Signed: Tristan Schroeder , PA-C 08/16/2022, 12:40 PM [facility]  Cardiology

## 2022-08-16 NOTE — Progress Notes (Signed)
PROGRESS NOTE    Mike Decker  ASN:[id number] DOB: 11-Oct-1941 DOA: 08/15/2022 PCP: Cletis Athens, MD  Assessment & Plan:   Principal Problem:   NSTEMI (non-ST elevated myocardial infarction) [facility]) Active Problems:   CAD (coronary artery disease)   Chronic diastolic heart failure ([place])   Essential hypertension   History of stroke   Type II diabetes mellitus with renal manifestations (HCC)   Hyperlipidemia   Anemia of chronic renal failure, stage 4 (severe) (HCC)   CKD (chronic kidney disease) stage 4, GFR 15-29 ml/min (HCC)   Gout   Chest pain   Depression  Assessment and Plan: Chest pain: w/ elevated troponins which likely secondary to demand ischemia. NSTEMI r/o. Hx of CAD. Continue on metoprolol, imdur, aspirin, plavix & stat. Holding ranex secondary to elevated Cr. Echo shows EF 19-37%, diastolic function is normal, no regional wall motion abnormalities. Continue on tele. Cardio following and recs apprec    Chronic diastolic heart failure: echo on 12/29/2019 showed EF of 60 to 65%.  Patient does not have leg edema.  BNP 88.  CHF is compensated. Holding home dose of torsemide    HTN: continue on metoprolol, imdur, hydralazine. Holding torsemide, entresto    Hx of CVA: continue on aspirin, plavix, statin    DM2: poorly controlled, HbA1c 7.9. 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 No need for a transfusion currently    AKI on CKDIV: baseline Cr around 2.04. Cr is still elevated but trending down. Nephro consulted    Gout: continue on allopurinol   Depression: severity unknown. Continue on home dose of citalopram   Obesity: BMI 32.9. Complicates overall care & prognosis     DVT prophylaxis: SCDs Code Status: full  Family Communication:  Disposition Plan: likely d/c back home   Level of care: Telemetry Cardiac  Status is: Inpatient Remains inpatient appropriate because: severity of  illness      Consultants:  Cardio  Nephro   Procedures:   Antimicrobials:   Subjective: Pt c/o chest pain but improved from day prior   Objective: Vitals:   08/16/22 1030 08/16/22 1100 08/16/22 1400 08/16/22 1515  BP: (!) 153/62 (!) 139/52 (!) 141/56 (!) 121/43  Pulse: 83 68 68 64  Resp:  18  16  Temp:  98.1 F (36.7 C)  98.4 F (36.9 C)  TempSrc:  Oral    SpO2:  97%  92%  Weight:      Height:        Intake/Output Summary (Last 24 hours) at 08/16/2022 1519 Last data filed at 08/15/2022 1941 Gross per 24 hour  Intake 1000 ml  Output --  Net 1000 ml   Filed Weights   08/15/22 0409  Weight: 92.5 kg    Examination:  General exam: Appears calm and comfortable  Respiratory system: Clear to auscultation. Respiratory effort normal. Cardiovascular system: S1 & S2 +. No rubs, gallops or clicks. No pedal edema. Gastrointestinal system: Abdomen is obese, soft and nontender. Normal bowel sounds heard. Central nervous system: Alert and oriented. Moves all extremities  Psychiatry: Judgement and insight appear normal. Mood & affect appropriate.     Data Reviewed: I have personally reviewed following labs and imaging studies  CBC: Recent Labs  Lab 08/15/22 0408  WBC 9.8  NEUTROABS 6.4  HGB 12.9*  HCT 39.8  MCV 94.5  PLT 902   Basic Metabolic Panel: Recent Labs  Lab 08/15/22 0408 08/16/22  0559  NA 137 140  K 3.7 3.6  CL 100 113*  CO2 24 20*  GLUCOSE 97 108*  BUN 76* 55*  CREATININE 3.57* 2.32*  CALCIUM 9.4 7.5*  MG 2.6*  --    GFR: Estimated Creatinine Clearance: 26.1 mL/min (A) (by C-G formula based on SCr of 2.32 mg/dL (H)). Liver Function Tests: Recent Labs  Lab 08/15/22 0408  AST 16  ALT 13  ALKPHOS 65  BILITOT 0.8  PROT 7.9  ALBUMIN 4.3   No results for input(s): "LIPASE", "AMYLASE" in the last 168 hours. No results for input(s): "AMMONIA" in the last 168 hours. Coagulation Profile: Recent Labs  Lab 08/15/22 0754  INR 1.1    Cardiac Enzymes: No results for input(s): "CKTOTAL", "CKMB", "CKMBINDEX", "TROPONINI" in the last 168 hours. BNP (last 3 results) No results for input(s): "PROBNP" in the last 8760 hours. HbA1C: Recent Labs    08/15/22 0956  HGBA1C 7.5*   CBG: Recent Labs  Lab 08/15/22 1149 08/15/22 1622 08/15/22 2158 08/16/22 0806 08/16/22 1150  GLUCAP 212* 224* 203* 125* 186*   Lipid Profile: Recent Labs    08/16/22 0559  CHOL 112  HDL 17*  LDLCALC 33  TRIG 311*  CHOLHDL 6.6   Thyroid Function Tests: No results for input(s): "TSH", "T4TOTAL", "FREET4", "T3FREE", "THYROIDAB" in the last 72 hours. Anemia Panel: No results for input(s): "VITAMINB12", "FOLATE", "FERRITIN", "TIBC", "IRON", "RETICCTPCT" in the last 72 hours. Sepsis Labs: No results for input(s): "PROCALCITON", "LATICACIDVEN" in the last 168 hours.  No results found for this or any previous visit (from the past 240 hour(s)).       Radiology Studies: ECHOCARDIOGRAM COMPLETE  Result Date: 08/16/2022    ECHOCARDIOGRAM REPORT   Patient Name:   Mike Decker Date of Exam: 08/15/2022 Medical Rec #:  [id number]            Height:       66.0 in Accession #:    [id number]           Weight:       203.9 lb Date of Birth:  Feb 13, 1941            BSA:          2.016 m Patient Age:    81 years             BP:           134/65 mmHg Patient Gender: M                    HR:           74 bpm. Exam Location:  [facility] Procedure: 2D Echo, Color Doppler and Cardiac Doppler Indications:     R07.9 Chest Pain  History:         Patient has prior history of Echocardiogram examinations, most                  recent 12/29/2019. CHF, PVD; Risk Factors:Hypertension,                  Dyslipidemia and Sleep Apnea.  Sonographer:     Charmayne Sheer Referring Phys:  [id number] Ida TANG Diagnosing Phys: Serafina Royals MD  Sonographer Comments: Suboptimal parasternal window and no subcostal window. IMPRESSIONS  1. Left ventricular ejection fraction, by  estimation, is 55 to 60%. The left ventricle has normal function. The left ventricle has no regional wall motion abnormalities. Left ventricular diastolic parameters were normal.  2. Right ventricular systolic function is normal. The right ventricular size is normal.  3. The mitral valve is normal in structure. Mild mitral valve regurgitation.  4. The aortic valve is calcified. Aortic valve regurgitation is mild to moderate. FINDINGS  Left Ventricle: Left ventricular ejection fraction, by estimation, is 55 to 60%. The left ventricle has normal function. The left ventricle has no regional wall motion abnormalities. The left ventricular internal cavity size was normal in size. There is  no left ventricular hypertrophy. Left ventricular diastolic parameters were normal. Right Ventricle: The right ventricular size is normal. No increase in right ventricular wall thickness. Right ventricular systolic function is normal. Left Atrium: Left atrial size was normal in size. Right Atrium: Right atrial size was normal in size. Pericardium: There is no evidence of pericardial effusion. Mitral Valve: The mitral valve is normal in structure. Mild mitral valve regurgitation. Tricuspid Valve: The tricuspid valve is normal in structure. Tricuspid valve regurgitation is mild. Aortic Valve: The aortic valve is calcified. Aortic valve regurgitation is mild to moderate. Aortic valve mean gradient measures 19.0 mmHg. Aortic valve peak gradient measures 35.0 mmHg. Aortic valve area, by VTI measures 1.29 cm. Pulmonic Valve: The pulmonic valve was normal in structure. Pulmonic valve regurgitation is trivial. Aorta: The aortic root and ascending aorta are structurally normal, with no evidence of dilitation. IAS/Shunts: No atrial level shunt detected by color flow Doppler.  LEFT VENTRICLE PLAX 2D LVIDd:         3.75 cm   Diastology LVIDs:         2.69 cm   LV e' medial:    5.66 cm/s LV PW:         1.18 cm   LV E/e' medial:  11.3 LV IVS:         0.90 cm   LV e' lateral:   5.22 cm/s LVOT diam:     2.20 cm   LV E/e' lateral: 12.2 LV SV:         82 LV SV Index:   41 LVOT Area:     3.80 cm  LEFT ATRIUM             Index LA diam:        3.90 cm 1.93 cm/m LA Vol (A2C):   51.5 ml 25.54 ml/m LA Vol (A4C):   50.5 ml 25.04 ml/m LA Biplane Vol: 54.7 ml 27.13 ml/m  AORTIC VALVE                     PULMONIC VALVE AV Area (Vmax):    1.39 cm      PV Vmax:       1.17 m/s AV Area (Vmean):   1.44 cm      PV Peak grad:  5.5 mmHg AV Area (VTI):     1.29 cm AV Vmax:           296.00 cm/s AV Vmean:          207.000 cm/s AV VTI:            0.633 m AV Peak Grad:      35.0 mmHg AV Mean Grad:      19.0 mmHg LVOT Vmax:         108.00 cm/s LVOT Vmean:        78.600 cm/s LVOT VTI:          0.215 m LVOT/AV VTI ratio: 0.34  AORTA Ao Root diam: 3.30 cm MITRAL VALVE  MV Area (PHT): 3.68 cm    SHUNTS MV Decel Time: 206 msec    Systemic VTI:  0.22 m MV E velocity: 63.80 cm/s  Systemic Diam: 2.20 cm MV A velocity: 99.40 cm/s MV E/A ratio:  0.64 Serafina Royals MD Electronically signed by Serafina Royals MD Signature Date/Time: 08/16/2022/12:39:05 PM    Final    DG Chest Portable 1 View  Result Date: 08/15/2022 CLINICAL DATA:  Chest pain.  No improvement with sublingual nitro. EXAM: PORTABLE CHEST 1 VIEW COMPARISON:  PA Lat 09/22/2020. FINDINGS: The heart size and mediastinal contours are within normal limits with mild aortic atherosclerosis. Both lungs are clear. The visualized skeletal structures are unremarkable. IMPRESSION: No radiographic evidence of acute chest disease. Electronically Signed   By: Telford Nab M.D.   On: 08/15/2022 04:46        Scheduled Meds:  arformoterol  15 mcg Nebulization BID   And   umeclidinium bromide  1 puff Inhalation Daily   aspirin  81 mg Oral Daily   azelastine  1 spray Each Nare Q1400   budesonide  0.5 mg Inhalation BID   calcitRIOL  0.25 mcg Oral Daily   citalopram  10 mg Oral Daily   clopidogrel  75 mg Oral Daily    cyanocobalamin  1,000 mcg Oral Daily   ferrous sulfate  325 mg Oral Q breakfast   fluticasone  1 spray Each Nare Daily   heparin  5,000 Units Subcutaneous Q8H   hydrALAZINE  25 mg Oral BID   insulin aspart  0-5 Units Subcutaneous QHS   insulin aspart  0-9 Units Subcutaneous TID WC   insulin glargine-yfgn  40 Units Subcutaneous QHS   isosorbide mononitrate  240 mg Oral Daily   magnesium oxide  400 mg Oral Daily   methocarbamol  500 mg Oral BID   metoprolol succinate  50 mg Oral Daily   montelukast  10 mg Oral QHS   pantoprazole  40 mg Oral BID AC   rosuvastatin  10 mg Oral QHS   senna-docusate  1 tablet Oral QHS   Continuous Infusions:  sodium chloride 40 mL/hr at 08/16/22 0600     LOS: 0 days    Time spent: 35 mins     Wyvonnia Dusky, MD [facility] Pager [phone]  If 7PM-7AM, please contact night-coverage [email or web address] 08/16/2022, 3:19 PM

## 2022-08-16 NOTE — Progress Notes (Signed)
[facility]  ROUNDING NOTE   Subjective:   Mike Decker is a 81 y.o. male with past medical conditions including CKD-IV, CAD, dCHF, HTN, HDL, stroke, gout, cancer of prostate and esophagus, and anemia.  Patient presents to the ED with complaints of chest pain that started overnight and has been admitted for Chest pain [R07.9] NSTEMI (non-ST elevated myocardial infarction) [facility]) [I21.4]  Patient is known to our practice and is followed by Dr. Juleen China.    Patient seen sitting at bedside, completed breakfast tray at bedside.  Alert and oriented Denies nausea or vomiting No lower extremity edema  Objective:  Vital signs in last 24 hours:  Temp:  [98 F (36.7 C)-98.7 F (37.1 C)] 98.1 F (36.7 C) (09/07 1100) Pulse Rate:  [60-83] 68 (09/07 1100) Resp:  [11-23] 18 (09/07 1100) BP: (108-153)/(52-75) 139/52 (09/07 1100) SpO2:  [95 %-99 %] 97 % (09/07 1100) FiO2 (%):  [21 %] 21 % (09/06 2300)  Weight change:  Filed Weights   08/15/22 0409  Weight: 92.5 kg    Intake/Output: I/O last 3 completed shifts: In: 1000 [I.V.:1000] Out: -    Intake/Output this shift:  No intake/output data recorded.  Physical Exam: General: NAD, resting comfortably  Head: Normocephalic, atraumatic. Moist oral mucosal membranes  Eyes: Anicteric  Lungs:  Clear to auscultation, normal effort, room air  Heart: Regular rate and rhythm  Abdomen:  Soft, nontender,, distended  Extremities: No peripheral edema.  Neurologic: Nonfocal, moving all four extremities  Skin: No lesions  Access: None    Basic Metabolic Panel: Recent Labs  Lab 08/15/22 0408 08/16/22 0559  NA 137 140  K 3.7 3.6  CL 100 113*  CO2 24 20*  GLUCOSE 97 108*  BUN 76* 55*  CREATININE 3.57* 2.32*  CALCIUM 9.4 7.5*  MG 2.6*  --      Liver Function Tests: Recent Labs  Lab 08/15/22 0408  AST 16  ALT 13  ALKPHOS 65  BILITOT 0.8  PROT 7.9  ALBUMIN 4.3    No results for input(s): "LIPASE",  "AMYLASE" in the last 168 hours. No results for input(s): "AMMONIA" in the last 168 hours.  CBC: Recent Labs  Lab 08/15/22 0408  WBC 9.8  NEUTROABS 6.4  HGB 12.9*  HCT 39.8  MCV 94.5  PLT 261     Cardiac Enzymes: No results for input(s): "CKTOTAL", "CKMB", "CKMBINDEX", "TROPONINI" in the last 168 hours.  BNP: Invalid input(s): "POCBNP"  CBG: Recent Labs  Lab 08/15/22 1149 08/15/22 1622 08/15/22 2158 08/16/22 0806 08/16/22 1150  GLUCAP 212* 224* 203* 125* 186*     Microbiology: Results for orders placed or performed in visit on 11/25/20  Gram Stain w/Sputum Cult Rflx     Status: None   Collection Time: 11/25/20  5:16 PM   SP  Result Value Ref Range Status   White Blood Cells Few  Final   Epithelial Cells Few  Final   Result 1 Comment  Final    Comment: Few gram positive cocci   Gram Stain Evaluation Comment  Final    Comment: This specimen is of good quality and is acceptable for routine bacterial culture.   Sputum Culture     Status: None   Collection Time: 11/25/20  5:16 PM   SP  Result Value Ref Range Status   Lower Respiratory Culture Final report  Final   Result 1 Comment  Final    Comment: Routine respiratory flora Heavy growth  Coagulation Studies: Recent Labs    08/15/22 0754  LABPROT 14.2  INR 1.1     Urinalysis: No results for input(s): "COLORURINE", "LABSPEC", "PHURINE", "GLUCOSEU", "HGBUR", "BILIRUBINUR", "KETONESUR", "PROTEINUR", "UROBILINOGEN", "NITRITE", "LEUKOCYTESUR" in the last 72 hours.  Invalid input(s): "APPERANCEUR"    Imaging: ECHOCARDIOGRAM COMPLETE  Result Date: 08/16/2022    ECHOCARDIOGRAM REPORT   Patient Name:   Mike Decker Date of Exam: 08/15/2022 Medical Rec #:  [id number]            Height:       66.0 in Accession #:    [id number]           Weight:       203.9 lb Date of Birth:  [birth year]            BSA:          2.016 m Patient Age:    61 years             BP:           134/65 mmHg Patient Gender: M                     HR:           74 bpm. Exam Location:  [facility] Procedure: 2D Echo, Color Doppler and Cardiac Doppler Indications:     R07.9 Chest Pain  History:         Patient has prior history of Echocardiogram examinations, most                  recent 12/29/2019. CHF, PVD; Risk Factors:Hypertension,                  Dyslipidemia and Sleep Apnea.  Sonographer:     Charmayne Sheer Referring Phys:  [id number] [place] TANG Diagnosing Phys: Serafina Royals MD  Sonographer Comments: Suboptimal parasternal window and no subcostal window. IMPRESSIONS  1. Left ventricular ejection fraction, by estimation, is 55 to 60%. The left ventricle has normal function. The left ventricle has no regional wall motion abnormalities. Left ventricular diastolic parameters were normal.  2. Right ventricular systolic function is normal. The right ventricular size is normal.  3. The mitral valve is normal in structure. Mild mitral valve regurgitation.  4. The aortic valve is calcified. Aortic valve regurgitation is mild to moderate. FINDINGS  Left Ventricle: Left ventricular ejection fraction, by estimation, is 55 to 60%. The left ventricle has normal function. The left ventricle has no regional wall motion abnormalities. The left ventricular internal cavity size was normal in size. There is  no left ventricular hypertrophy. Left ventricular diastolic parameters were normal. Right Ventricle: The right ventricular size is normal. No increase in right ventricular wall thickness. Right ventricular systolic function is normal. Left Atrium: Left atrial size was normal in size. Right Atrium: Right atrial size was normal in size. Pericardium: There is no evidence of pericardial effusion. Mitral Valve: The mitral valve is normal in structure. Mild mitral valve regurgitation. Tricuspid Valve: The tricuspid valve is normal in structure. Tricuspid valve regurgitation is mild. Aortic Valve: The aortic valve is calcified. Aortic valve regurgitation is mild to  moderate. Aortic valve mean gradient measures 19.0 mmHg. Aortic valve peak gradient measures 35.0 mmHg. Aortic valve area, by VTI measures 1.29 cm. Pulmonic Valve: The pulmonic valve was normal in structure. Pulmonic valve regurgitation is trivial. Aorta: The aortic root and ascending aorta are structurally normal, with no evidence of dilitation. IAS/Shunts: No atrial  level shunt detected by color flow Doppler.  LEFT VENTRICLE PLAX 2D LVIDd:         3.75 cm   Diastology LVIDs:         2.69 cm   LV e' medial:    5.66 cm/s LV PW:         1.18 cm   LV E/e' medial:  11.3 LV IVS:        0.90 cm   LV e' lateral:   5.22 cm/s LVOT diam:     2.20 cm   LV E/e' lateral: 12.2 LV SV:         82 LV SV Index:   41 LVOT Area:     3.80 cm  LEFT ATRIUM             Index LA diam:        3.90 cm 1.93 cm/m LA Vol (A2C):   51.5 ml 25.54 ml/m LA Vol (A4C):   50.5 ml 25.04 ml/m LA Biplane Vol: 54.7 ml 27.13 ml/m  AORTIC VALVE                     PULMONIC VALVE AV Area (Vmax):    1.39 cm      PV Vmax:       1.17 m/s AV Area (Vmean):   1.44 cm      PV Peak grad:  5.5 mmHg AV Area (VTI):     1.29 cm AV Vmax:           296.00 cm/s AV Vmean:          207.000 cm/s AV VTI:            0.633 m AV Peak Grad:      35.0 mmHg AV Mean Grad:      19.0 mmHg LVOT Vmax:         108.00 cm/s LVOT Vmean:        78.600 cm/s LVOT VTI:          0.215 m LVOT/AV VTI ratio: 0.34  AORTA Ao Root diam: 3.30 cm MITRAL VALVE MV Area (PHT): 3.68 cm    SHUNTS MV Decel Time: 206 msec    Systemic VTI:  0.22 m MV E velocity: 63.80 cm/s  Systemic Diam: 2.20 cm MV A velocity: 99.40 cm/s MV E/A ratio:  0.64 Serafina Royals MD Electronically signed by Serafina Royals MD Signature Date/Time: 08/16/2022/12:39:05 PM    Final    DG Chest Portable 1 View  Result Date: 08/15/2022 CLINICAL DATA:  Chest pain.  No improvement with sublingual nitro. EXAM: PORTABLE CHEST 1 VIEW COMPARISON:  PA Lat 09/22/2020. FINDINGS: The heart size and mediastinal contours are within normal  limits with mild aortic atherosclerosis. Both lungs are clear. The visualized skeletal structures are unremarkable. IMPRESSION: No radiographic evidence of acute chest disease. Electronically Signed   By: Telford Nab M.D.   On: 08/15/2022 04:46     Medications:    sodium chloride 40 mL/hr at 08/16/22 0600    arformoterol  15 mcg Nebulization BID   And   umeclidinium bromide  1 puff Inhalation Daily   aspirin  81 mg Oral Daily   azelastine  1 spray Each Nare Q1400   budesonide  0.5 mg Inhalation BID   calcitRIOL  0.25 mcg Oral Daily   citalopram  10 mg Oral Daily   clopidogrel  75 mg Oral Daily   cyanocobalamin  1,000 mcg Oral Daily  ferrous sulfate  325 mg Oral Q breakfast   fluticasone  1 spray Each Nare Daily   heparin  5,000 Units Subcutaneous Q8H   hydrALAZINE  25 mg Oral BID   insulin aspart  0-5 Units Subcutaneous QHS   insulin aspart  0-9 Units Subcutaneous TID WC   insulin glargine-yfgn  40 Units Subcutaneous QHS   isosorbide mononitrate  240 mg Oral Daily   magnesium oxide  400 mg Oral Daily   methocarbamol  500 mg Oral BID   metoprolol succinate  50 mg Oral Daily   montelukast  10 mg Oral QHS   pantoprazole  40 mg Oral BID AC   rosuvastatin  10 mg Oral QHS   senna-docusate  1 tablet Oral QHS   acetaminophen, albuterol, colchicine, dextromethorphan-guaiFENesin, hydrALAZINE, loratadine, morphine injection, nitroGLYCERIN, ondansetron (ZOFRAN) IV, polyethylene glycol, zolpidem  Assessment/ Plan:  Mr. Leotis Isham is a 81 y.o.  male with past medical conditions including CKD-IV, CAD, dCHF, HTN, HDL, stroke, gout, cancer of prostate and esophagus, and anemia.  Patient presents to the ED with complaints of chest pain that started overnight and has been admitted for Chest pain [R07.9] NSTEMI (non-ST elevated myocardial infarction) ([place]) [I21.4]   Acute Kidney Injury on chronic kidney disease stage IV with baseline creatinine 2.71 and GFR of 23 on 5//23.  Acute  kidney injury secondary to hypovolemia with continued diuretic use.  No IV contrast exposure.  Torsemide currently held.    Creatinine greatly improved with IVF overnight. Would recommend restarting Torsemide '40mg'$  on a MWF schedule. Patient has follow up appt scheduled in our office next week.   Lab Results  Component Value Date   CREATININE 2.32 (H) 08/16/2022   CREATININE 3.57 (H) 08/15/2022   CREATININE 2.04 (H) 12/25/2021    Intake/Output Summary (Last 24 hours) at 08/16/2022 1327 Last data filed at 08/15/2022 1941 Gross per 24 hour  Intake 1000 ml  Output --  Net 1000 ml    2.  Hypertension with chronic kidney disease.  Home regimen includes hydralazine, isosorbide,, metoprolol, Entresto and torsemide.  Entresto and torsemide currently held in setting of kidney injury. Blood pressure 139/52.  3. Diabetes mellitus type II with chronic kidney disease/renal manifestations: insulin dependent. Home regimen includes Lantus, NovoLog, and Jardiance. Most recent hemoglobin A1c is 7.5 on 08/15/2022.   4. Anemia of chronic kidney disease Lab Results  Component Value Date   HGB 12.9 (L) 08/15/2022    Hemoglobin within acceptable range.   LOS: 0 Tris Howell 9/7/20231:27 PM

## 2022-08-17 DIAGNOSIS — I10 Essential (primary) hypertension: Secondary | ICD-10-CM | POA: Diagnosis not present

## 2022-08-17 DIAGNOSIS — N184 Chronic kidney disease, stage 4 (severe): Secondary | ICD-10-CM | POA: Diagnosis not present

## 2022-08-17 DIAGNOSIS — R079 Chest pain, unspecified: Secondary | ICD-10-CM | POA: Diagnosis not present

## 2022-08-17 LAB — CBC
HCT: 34.9 % — ABNORMAL LOW (ref 39.0–52.0)
Hemoglobin: 11.4 g/dL — ABNORMAL LOW (ref 13.0–17.0)
MCH: 31.1 pg (ref 26.0–34.0)
MCHC: 32.7 g/dL (ref 30.0–36.0)
MCV: 95.4 fL (ref 80.0–100.0)
Platelets: 238 10*3/uL (ref 150–400)
RBC: 3.66 MIL/uL — ABNORMAL LOW (ref 4.22–5.81)
RDW: 14.3 % (ref 11.5–15.5)
WBC: 8.4 10*3/uL (ref 4.0–10.5)
nRBC: 0 % (ref 0.0–0.2)

## 2022-08-17 LAB — GLUCOSE, CAPILLARY
Glucose-Capillary: 244 mg/dL — ABNORMAL HIGH (ref 70–99)
Glucose-Capillary: 132 mg/dL — ABNORMAL HIGH (ref 70–99)
Glucose-Capillary: 147 mg/dL — ABNORMAL HIGH (ref 70–99)
Glucose-Capillary: 134 mg/dL — ABNORMAL HIGH (ref 70–99)

## 2022-08-17 LAB — BASIC METABOLIC PANEL
Anion gap: 8 (ref 5–15)
BUN: 55 mg/dL — ABNORMAL HIGH (ref 8–23)
CO2: 22 mmol/L (ref 22–32)
Calcium: 8.9 mg/dL (ref 8.9–10.3)
Chloride: 110 mmol/L (ref 98–111)
Creatinine, Ser: 2.33 mg/dL — ABNORMAL HIGH (ref 0.61–1.24)
GFR, Estimated: 27 mL/min — ABNORMAL LOW (ref >60)
Glucose, Bld: 130 mg/dL — ABNORMAL HIGH (ref 70–99)
Potassium: 4 mmol/L (ref 3.5–5.1)
Sodium: 140 mmol/L (ref 135–145)

## 2022-08-17 MED ORDER — ROSUVASTATIN CALCIUM 10 MG PO TABS: 10.0000 mg | ORAL_TABLET | Freq: Every day | ORAL | 0 refills | Status: DC

## 2022-08-17 MED ORDER — METOPROLOL SUCCINATE ER 50 MG PO TB24: 50.0000 mg | ORAL_TABLET | Freq: Every day | ORAL | 0 refills | Status: DC

## 2022-08-17 MED ORDER — TORSEMIDE 40 MG PO TABS: 40.0000 mg | ORAL_TABLET | ORAL | Status: DC

## 2022-08-17 MED ORDER — ENTRESTO 24-26 MG PO TABS: 1.0000 | ORAL_TABLET | Freq: Two times a day (BID) | ORAL | 3 refills | Status: DC

## 2022-08-17 MED ORDER — TORSEMIDE 20 MG PO TABS
40.0000 mg | ORAL_TABLET | ORAL | Status: DC
  Administered 2022-08-17: 40 mg via ORAL
  Filled 2022-08-17: qty 2

## 2022-08-17 MED ORDER — RANOLAZINE ER 500 MG PO TB12
500.0000 mg | ORAL_TABLET | Freq: Two times a day (BID) | ORAL | Status: DC
  Administered 2022-08-17: 500 mg via ORAL
  Filled 2022-08-17: qty 1

## 2022-08-17 NOTE — Progress Notes (Signed)
[facility]  ROUNDING NOTE   Subjective:   Mike Decker is a 81 y.o. male with past medical conditions including CKD-IV, CAD, dCHF, HTN, HDL, stroke, gout, cancer of prostate and esophagus, and anemia.  Patient presents to the ED with complaints of chest pain that started overnight and has been admitted for Other chest pain [R07.89] NSTEMI (non-ST elevated myocardial infarction) [facility]) [I21.4] Chest pain [R07.9]  Patient is known to our practice and is followed by Dr. Juleen China.    Patient seen sitting up in bed, currently nauseated Patient has history of Barrett's esophagus and has followed with GI in the past No previous episodes of nausea or vomiting during this admission.  Objective:  Vital signs in last 24 hours:  Temp:  [97.8 F (36.6 C)-98.5 F (36.9 C)] 98.5 F (36.9 C) (09/08 1132) Pulse Rate:  [62-71] 71 (09/08 1132) Resp:  [15-20] 19 (09/08 1132) BP: (121-155)/(43-60) 129/48 (09/08 1205) SpO2:  [92 %-100 %] 100 % (09/08 1132) Weight:  [92.4 kg-98.6 kg] 98.6 kg (09/08 0932)  Weight change:  Filed Weights   08/15/22 0409 08/17/22 0500 08/17/22 0932  Weight: 92.5 kg 92.4 kg 98.6 kg    Intake/Output: I/O last 3 completed shifts: In: 1000 [I.V.:1000] Out: 950 [Urine:950]   Intake/Output this shift:  Total I/O In: 120 [P.O.:120] Out: -   Physical Exam: General: NAD, resting comfortably  Head: Normocephalic, atraumatic. Moist oral mucosal membranes  Eyes: Anicteric  Lungs:  Clear to auscultation, normal effort, room air  Heart: Regular rate and rhythm  Abdomen:  Soft, nontender,, distended  Extremities: No peripheral edema.  Neurologic: Nonfocal, moving all four extremities  Skin: No lesions  Access: None    Basic Metabolic Panel: Recent Labs  Lab 08/15/22 0408 08/16/22 0559 08/17/22 0811  NA 137 140 140  K 3.7 3.6 4.0  CL 100 113* 110  CO2 24 20* 22  GLUCOSE 97 108* 130*  BUN 76* 55* 55*  CREATININE 3.57* 2.32* 2.33*   CALCIUM 9.4 7.5* 8.9  MG 2.6*  --   --      Liver Function Tests: Recent Labs  Lab 08/15/22 0408  AST 16  ALT 13  ALKPHOS 65  BILITOT 0.8  PROT 7.9  ALBUMIN 4.3    No results for input(s): "LIPASE", "AMYLASE" in the last 168 hours. No results for input(s): "AMMONIA" in the last 168 hours.  CBC: Recent Labs  Lab 08/15/22 0408 08/17/22 0811  WBC 9.8 8.4  NEUTROABS 6.4  --   HGB 12.9* 11.4*  HCT 39.8 34.9*  MCV 94.5 95.4  PLT 261 238     Cardiac Enzymes: No results for input(s): "CKTOTAL", "CKMB", "CKMBINDEX", "TROPONINI" in the last 168 hours.  BNP: Invalid input(s): "POCBNP"  CBG: Recent Labs  Lab 08/16/22 1641 08/16/22 2050 08/16/22 2240 08/17/22 0737 08/17/22 1133  GLUCAP 216* 244* 228* 132* 147*     Microbiology: Results for orders placed or performed in visit on 11/25/20  Gram Stain w/Sputum Cult Rflx     Status: None   Collection Time: 11/25/20  5:16 PM   SP  Result Value Ref Range Status   White Blood Cells Few  Final   Epithelial Cells Few  Final   Result 1 Comment  Final    Comment: Few gram positive cocci   Gram Stain Evaluation Comment  Final    Comment: This specimen is of good quality and is acceptable for routine bacterial culture.   Sputum Culture  Status: None   Collection Time: 11/25/20  5:16 PM   SP  Result Value Ref Range Status   Lower Respiratory Culture Final report  Final   Result 1 Comment  Final    Comment: Routine respiratory flora Heavy growth     Coagulation Studies: Recent Labs    08/15/22 0754  LABPROT 14.2  INR 1.1     Urinalysis: No results for input(s): "COLORURINE", "LABSPEC", "PHURINE", "GLUCOSEU", "HGBUR", "BILIRUBINUR", "KETONESUR", "PROTEINUR", "UROBILINOGEN", "NITRITE", "LEUKOCYTESUR" in the last 72 hours.  Invalid input(s): "APPERANCEUR"    Imaging: ECHOCARDIOGRAM COMPLETE  Result Date: 08/16/2022    ECHOCARDIOGRAM REPORT   Patient Name:   Mike Decker Date of Exam:  08/15/2022 Medical Rec #:  [id number]            Height:       66.0 in Accession #:    [id number]           Weight:       203.9 lb Date of Birth:  [birth year]            BSA:          2.016 m Patient Age:    45 years             BP:           134/65 mmHg Patient Gender: M                    HR:           74 bpm. Exam Location:  [facility] Procedure: 2D Echo, Color Doppler and Cardiac Doppler Indications:     R07.9 Chest Pain  History:         Patient has prior history of Echocardiogram examinations, most                  recent 12/29/2019. CHF, PVD; Risk Factors:Hypertension,                  Dyslipidemia and Sleep Apnea.  Sonographer:     Charmayne Sheer Referring Phys:  [id number] [place] TANG Diagnosing Phys: Serafina Royals MD  Sonographer Comments: Suboptimal parasternal window and no subcostal window. IMPRESSIONS  1. Left ventricular ejection fraction, by estimation, is 55 to 60%. The left ventricle has normal function. The left ventricle has no regional wall motion abnormalities. Left ventricular diastolic parameters were normal.  2. Right ventricular systolic function is normal. The right ventricular size is normal.  3. The mitral valve is normal in structure. Mild mitral valve regurgitation.  4. The aortic valve is calcified. Aortic valve regurgitation is mild to moderate. FINDINGS  Left Ventricle: Left ventricular ejection fraction, by estimation, is 55 to 60%. The left ventricle has normal function. The left ventricle has no regional wall motion abnormalities. The left ventricular internal cavity size was normal in size. There is  no left ventricular hypertrophy. Left ventricular diastolic parameters were normal. Right Ventricle: The right ventricular size is normal. No increase in right ventricular wall thickness. Right ventricular systolic function is normal. Left Atrium: Left atrial size was normal in size. Right Atrium: Right atrial size was normal in size. Pericardium: There is no evidence of pericardial effusion.  Mitral Valve: The mitral valve is normal in structure. Mild mitral valve regurgitation. Tricuspid Valve: The tricuspid valve is normal in structure. Tricuspid valve regurgitation is mild. Aortic Valve: The aortic valve is calcified. Aortic valve regurgitation is mild to moderate. Aortic valve mean gradient measures 19.0  mmHg. Aortic valve peak gradient measures 35.0 mmHg. Aortic valve area, by VTI measures 1.29 cm. Pulmonic Valve: The pulmonic valve was normal in structure. Pulmonic valve regurgitation is trivial. Aorta: The aortic root and ascending aorta are structurally normal, with no evidence of dilitation. IAS/Shunts: No atrial level shunt detected by color flow Doppler.  LEFT VENTRICLE PLAX 2D LVIDd:         3.75 cm   Diastology LVIDs:         2.69 cm   LV e' medial:    5.66 cm/s LV PW:         1.18 cm   LV E/e' medial:  11.3 LV IVS:        0.90 cm   LV e' lateral:   5.22 cm/s LVOT diam:     2.20 cm   LV E/e' lateral: 12.2 LV SV:         82 LV SV Index:   41 LVOT Area:     3.80 cm  LEFT ATRIUM             Index LA diam:        3.90 cm 1.93 cm/m LA Vol (A2C):   51.5 ml 25.54 ml/m LA Vol (A4C):   50.5 ml 25.04 ml/m LA Biplane Vol: 54.7 ml 27.13 ml/m  AORTIC VALVE                     PULMONIC VALVE AV Area (Vmax):    1.39 cm      PV Vmax:       1.17 m/s AV Area (Vmean):   1.44 cm      PV Peak grad:  5.5 mmHg AV Area (VTI):     1.29 cm AV Vmax:           296.00 cm/s AV Vmean:          207.000 cm/s AV VTI:            0.633 m AV Peak Grad:      35.0 mmHg AV Mean Grad:      19.0 mmHg LVOT Vmax:         108.00 cm/s LVOT Vmean:        78.600 cm/s LVOT VTI:          0.215 m LVOT/AV VTI ratio: 0.34  AORTA Ao Root diam: 3.30 cm MITRAL VALVE MV Area (PHT): 3.68 cm    SHUNTS MV Decel Time: 206 msec    Systemic VTI:  0.22 m MV E velocity: 63.80 cm/s  Systemic Diam: 2.20 cm MV A velocity: 99.40 cm/s MV E/A ratio:  0.64 Serafina Royals MD Electronically signed by Serafina Royals MD Signature Date/Time:  08/16/2022/12:39:05 PM    Final      Medications:      arformoterol  15 mcg Nebulization BID   And   umeclidinium bromide  1 puff Inhalation Daily   aspirin  81 mg Oral Daily   azelastine  1 spray Each Nare Q1400   budesonide  0.5 mg Inhalation BID   calcitRIOL  0.25 mcg Oral Daily   citalopram  10 mg Oral Daily   clopidogrel  75 mg Oral Daily   cyanocobalamin  1,000 mcg Oral Daily   ferrous sulfate  325 mg Oral Q breakfast   fluticasone  1 spray Each Nare Daily   heparin  5,000 Units Subcutaneous Q8H   hydrALAZINE  25 mg Oral BID   insulin aspart  0-5  Units Subcutaneous QHS   insulin aspart  0-9 Units Subcutaneous TID WC   insulin glargine-yfgn  40 Units Subcutaneous QHS   isosorbide mononitrate  240 mg Oral Daily   magnesium oxide  400 mg Oral Daily   methocarbamol  500 mg Oral BID   metoprolol succinate  50 mg Oral Daily   montelukast  10 mg Oral QHS   pantoprazole  40 mg Oral BID AC   ranolazine  500 mg Oral BID   rosuvastatin  10 mg Oral QHS   senna-docusate  1 tablet Oral QHS   torsemide  40 mg Oral Once per day on Mon Wed Fri   acetaminophen, albuterol, colchicine, dextromethorphan-guaiFENesin, hydrALAZINE, loratadine, morphine injection, nitroGLYCERIN, ondansetron (ZOFRAN) IV, polyethylene glycol, zolpidem  Assessment/ Plan:  Mike Decker is a 81 y.o.  male with past medical conditions including CKD-IV, CAD, dCHF, HTN, HDL, stroke, gout, cancer of prostate and esophagus, and anemia.  Patient presents to the ED with complaints of chest pain that started overnight and has been admitted for Other chest pain [R07.89] NSTEMI (non-ST elevated myocardial infarction) [facility]) [I21.4] Chest pain [R07.9]   Acute Kidney Injury on chronic kidney disease stage IV with baseline creatinine 2.71 and GFR of 23 on 5//23.  Acute kidney injury secondary to hypovolemia with continued diuretic use.  No IV contrast exposure.  Torsemide currently held.    Creatinine remains stable  today.  Adequate oral intake.  Patient now nauseated.  Original plan to start torsemide 3 times weekly today and stop IV fluids.  We will continue this plan and monitor for further vomiting.  We will schedule patient follow-up in our office at discharge.  Lab Results  Component Value Date   CREATININE 2.33 (H) 08/17/2022   CREATININE 2.32 (H) 08/16/2022   CREATININE 3.57 (H) 08/15/2022    Intake/Output Summary (Last 24 hours) at 08/17/2022 1233 Last data filed at 08/17/2022 1052 Gross per 24 hour  Intake 120 ml  Output 950 ml  Net -830 ml     2.  Hypertension with chronic kidney disease.  Home regimen includes hydralazine, isosorbide,, metoprolol, Entresto and torsemide.  Entresto and torsemide currently held in setting of kidney injury. Blood pressure acceptable for this patient  3. Diabetes mellitus type II with chronic kidney disease/renal manifestations: insulin dependent. Home regimen includes Lantus, NovoLog, and Jardiance. Most recent hemoglobin A1c is 7.5 on 08/15/2022.   Glucose was elevated yesterday.  Primary team to manage sliding scale insulin.  4. Anemia of chronic kidney disease Lab Results  Component Value Date   HGB 11.4 (L) 08/17/2022    Hemoglobin within acceptable target.   LOS: 1 Zeno Hickel 9/8/202312:33 PM

## 2022-08-17 NOTE — Discharge Summary (Signed)
Physician Discharge Summary  Mike Decker VQM:[id number] DOB: June 26, 1941 DOA: 08/15/2022  PCP: Cletis Athens, MD  Admit date: 08/15/2022 Discharge date: 08/17/2022  Admitted From: home  Disposition:  home  Recommendations for Outpatient Follow-up:  Follow up with PCP in 1-2 weeks F/u w/ cardio, Dr. Clayborn Bigness, in 1-2 weeks F/u w/ nephro in 1-2 weeks   Home Health: no  Equipment/Devices:  Discharge Condition: stable  CODE STATUS: full  Diet recommendation: Heart Healthy / Carb Modified   Brief/Interim Summary: HPI was taken from Dr. Blaine Hamper: Mike Decker is a 81 y.o. male with medical history significant of CKD-IV, CAD, dCHF, HTN, HDL, stroke, gout, cancer of prostate and esophagus, anemia, who presents with chest pain.     Patient states that he had chest pain 2 days ago which resolved after taking nitroglycerin.  He started having chest pain again last night, which has been more persistent.  The chest pain is located in the substernal area, 6 out of 10 in severity, now radiating, pressure-like.  Patient has dry cough, no shortness of breath, fever or chills.  Denies nausea, vomiting, diarrhea or abdominal pain.  No symptoms of UTI.   Data reviewed independently and ED Course: pt was found to have troponin level 22, 24, 31, 41.  WBC 9.8, worsening renal function with creatinine 3.57, BUN 76, GFR 16 (baseline creatinine 2.04 on 12/25/2021), temperature normal, blood pressure 178/69, heart rate 69, RR 20, oxygen saturation 95% on room air.  Chest x-ray negative.  Patient is placed on telemetry bed forobs. Dr. Nehemiah Massed of cardiology and Dr. Holley Raring of renal are consulted.   As per Dr. Jimmye Norman 9/7-08/17/22: Pt presented w/ chest pain w/ elevated troponins, so cardio was consulted. 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 Pt was treated medically as per cardio. Pt's metoprolol tartrate was changed to metoprolol succinate as per  cardio.  Discharge Diagnoses:  Principal Problem:   NSTEMI (non-ST elevated myocardial infarction) ([place]) Active Problems:   CAD (coronary artery disease)   Chronic diastolic heart failure (HCC)   Essential hypertension   History of stroke   Type II diabetes mellitus with renal manifestations (HCC)   Hyperlipidemia   Anemia of chronic renal failure, stage 4 (severe) (HCC)   CKD (chronic kidney disease) stage 4, GFR 15-29 ml/min (HCC)   Gout   Chest pain   Depression  Chest pain: w/ elevated troponins which likely secondary to demand ischemia. NSTEMI r/o. Hx of CAD. Continue on metoprolol, imdur, aspirin, plavix & stat. Restart ranex today. Echo shows EF 93-26%, diastolic function is normal, no regional wall motion abnormalities. Continue on tele. Cardio following and recs apprec    Chronic diastolic heart failure: echo on 12/29/2019 showed EF of 60 to 65%.  Patient does not have leg edema.  BNP 88.  CHF is compensated. Torsemide qMWF    HTN: continue on metoprolol, imdur, hydralazine. Holding entresto. Restart torsemide qMWF   Hx of CVA: continue on aspirin, plavix, statin    DM2: poorly controlled, HbA1c 7.9. 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 No need for a transfusion currently    AKI on CKDIV: baseline Cr around 2.04. Cr is labile. Nephro following and recs apprec   Gout: continue on allopurinol   Depression: severity unknown. Continue on home dose of citalopram   Obesity: BMI 32.9. Complicates overall care & prognosis  Discharge Instructions  Discharge Instructions     Diet - low sodium heart healthy   Complete by: As directed    Diet Carb Modified   Complete by: As directed    Discharge instructions   Complete by: As directed    F/u w/ PCP in 1-2 weeks. F/u w/ nephro in 1-2 weeks. F/u w/ cardio, Dr. Clayborn Bigness, in 1-2 weeks   Increase activity slowly   Complete by: As directed       Allergies as  of 08/17/2022       Reactions   Gabapentin    Other reaction(s): Other (See Comments) Tremors   Peanut-containing Drug Products Anaphylaxis   Penicillins Hives, Rash   Has patient had a PCN reaction causing immediate rash, facial/tongue/throat swelling, SOB or lightheadedness with hypotension: Yes Has patient had a PCN reaction causing severe rash involving mucus membranes or skin necrosis: No Has patient had a PCN reaction that required hospitalization No Has patient had a PCN reaction occurring within the last 10 years: No If all of the above answers are "NO", then may proceed with Cephalosporin use.   Bee Venom Swelling   Influenza Vaccines Hives   Inh [isoniazid] Hives   Jardiance [empagliflozin] Other (See Comments)   Diarrhea   Kenalog [triamcinolone Acetonide] Hives   Levaquin [levofloxacin] Other (See Comments)   Tendon, ligament pain.    Lisinopril    Other reaction(s): Hyperkalemia   Nalfon [fenoprofen Calcium] Hives   Naproxen    Peanut Oil    Nsaids Rash   Nalfon 600 Nalfon 600        Medication List     STOP taking these medications    dextromethorphan-guaiFENesin 30-600 MG 12hr tablet Commonly known as: MUCINEX DM   Fish Oil 1517 MG Caps   folic acid 616 MCG tablet Commonly known as: FOLVITE   metoprolol tartrate 50 MG tablet Commonly known as: LOPRESSOR   Trelegy Ellipta 100-62.5-25 MCG/ACT Aepb Generic drug: Fluticasone-Umeclidin-Vilant       TAKE these medications    acetaminophen 500 MG tablet Commonly known as: TYLENOL Take 1,000 mg by mouth every 8 (eight) hours as needed for mild pain.   allopurinol 100 MG tablet Commonly known as: ZYLOPRIM Take 100 mg by mouth daily.   aspirin EC 81 MG tablet Take 81 mg by mouth daily.   azelastine 0.1 % nasal spray Commonly known as: ASTELIN USE 1 TO 2 SPRAYS IN EACH NOSTRIL TWICE A DAY   calcitRIOL 0.25 MCG capsule Commonly known as: ROCALTROL Take by mouth.   cetirizine 5 MG  tablet Commonly known as: ZYRTEC Take 5 mg by mouth daily as needed for allergies or rhinitis.   citalopram 10 MG tablet Commonly known as: CELEXA Take 1 tablet (10 mg total) by mouth daily.   clopidogrel 75 MG tablet Commonly known as: PLAVIX TAKE 1 TABLET BY MOUTH EVERY DAY   colchicine 0.6 MG tablet Take 3 tablets (1.8 mg total) by mouth daily as needed (gout flares).   Cyanocobalamin 1000 MCG Subl Take 1,000 mcg by mouth daily.   [employer] VITAMIN B12 1000 MCG tablet Generic drug: cyanocobalamin TAKE 1 TABLET BY MOUTH EVERY DAY   Denta 5000 Plus 1.1 % Crea dental cream Generic drug: sodium fluoride See admin instructions.   Entresto 24-26 MG Generic drug: sacubitril-valsartan Take 1 tablet by mouth 2 (two) times daily. Hold this medication until you see your cardiologist and/or nephrologist What changed: additional instructions   EPINEPHrine 0.3 mg/0.3 mL Soaj injection  Commonly known as: EPI-PEN Inject into the muscle as needed (anaphylaxis).   ferrous sulfate 325 (65 FE) MG EC tablet TAKE 1 TABLET BY MOUTH EVERY DAY   fluticasone 50 MCG/ACT nasal spray Commonly known as: FLONASE USE 1 SPRAY IN EACH NOSTRIL EVERY DAY   hydrALAZINE 50 MG tablet Commonly known as: APRESOLINE Take 25 mg by mouth 2 (two) times daily.   insulin aspart 100 UNIT/ML FlexPen Commonly known as: NOVOLOG 15 UNITS IN AM, 25 UNITS AT LUNCH, 25 UNITS AT DINNER   insulin glargine 100 UNIT/ML injection Commonly known as: LANTUS Inject 0.35 mLs (35 Units total) into the skin at bedtime. This is a decrease from your previous 45 units nightly. What changed: how much to take   isosorbide mononitrate 120 MG 24 hr tablet Commonly known as: IMDUR Take 240 mg by mouth daily.   Jardiance 10 MG Tabs tablet Generic drug: empagliflozin Take 10 mg by mouth daily.   magnesium oxide 400 (240 Mg) MG tablet Commonly known as: MAG-OX Take 1 tablet by mouth daily.   methocarbamol 500 MG tablet Commonly  known as: ROBAXIN Take 500 mg by mouth 2 (two) times daily.   metoprolol succinate 50 MG 24 hr tablet Commonly known as: TOPROL-XL Take 1 tablet (50 mg total) by mouth daily. Take with or immediately following a meal. Start taking on: August 18, 2022   mometasone 220 MCG/ACT inhaler Commonly known as: ASMANEX Inhale 2 puffs into the lungs daily.   montelukast 10 MG tablet Commonly known as: SINGULAIR TAKE 1 TABLET BY MOUTH EVERYDAY AT BEDTIME   nitroGLYCERIN 0.4 MG SL tablet Commonly known as: NITROSTAT Place 0.4 mg under the tongue every 5 (five) minutes x 3 doses as needed for chest pain.   pantoprazole 40 MG tablet Commonly known as: PROTONIX Take 1 tablet (40 mg total) by mouth 2 (two) times daily before a meal.   ranolazine 500 MG 12 hr tablet Commonly known as: RANEXA Take 500 mg by mouth 2 (two) times daily.   rosuvastatin 10 MG tablet Commonly known as: CRESTOR Take 1 tablet (10 mg total) by mouth at bedtime. What changed:  medication strength how much to take when to take this   sodium bicarbonate 650 MG tablet Take 650 mg by mouth 2 (two) times daily.   Stiolto Respimat 2.5-2.5 MCG/ACT Aers Generic drug: Tiotropium Bromide-Olodaterol Inhale 1 each into the lungs daily.   Torsemide 40 MG Tabs Take 40 mg by mouth 3 (three) times a week. Take once on MWF Start taking on: August 20, 2022 What changed:  See the new instructions. Another medication with the same name was removed. Continue taking this medication, and follow the directions you see here.   Vitamin D (Ergocalciferol) 50000 units Caps Take 1 capsule by mouth every 30 (thirty) days.        Follow-up Information     Yolonda Kida, MD. Go in 1 week(s).   Specialties: Cardiology, Internal Medicine Contact information: [place] [address] [phone]                Allergies  Allergen Reactions   Gabapentin     Other reaction(s): Other (See  Comments) Tremors   Peanut-Containing Drug Products Anaphylaxis   Penicillins Hives and Rash    Has patient had a PCN reaction causing immediate rash, facial/tongue/throat swelling, SOB or lightheadedness with hypotension: Yes Has patient had a PCN reaction causing severe rash involving mucus membranes or skin necrosis: No  Has patient had a PCN reaction that required hospitalization No Has patient had a PCN reaction occurring within the last 10 years: No If all of the above answers are "NO", then may proceed with Cephalosporin use.   Bee Venom Swelling   Influenza Vaccines Hives   Inh [Isoniazid] Hives   Jardiance [Empagliflozin] Other (See Comments)    Diarrhea   Kenalog [Triamcinolone Acetonide] Hives   Levaquin [Levofloxacin] Other (See Comments)    Tendon, ligament pain.    Lisinopril     Other reaction(s): Hyperkalemia   Nalfon [Fenoprofen Calcium] Hives   Naproxen    Peanut Oil    Nsaids Rash    Nalfon 600 Nalfon 600    Consultations: Cardio Nephro    Procedures/Studies: ECHOCARDIOGRAM COMPLETE  Result Date: 08/16/2022    ECHOCARDIOGRAM REPORT   Patient Name:   Mike Decker Date of Exam: 08/15/2022 Medical Rec #:  [id number]            Height:       66.0 in Accession #:    [id number]           Weight:       203.9 lb Date of Birth:  1941-05-26            BSA:          2.016 m Patient Age:    41 years             BP:           134/65 mmHg Patient Gender: M                    HR:           74 bpm. Exam Location:  [facility] Procedure: 2D Echo, Color Doppler and Cardiac Doppler Indications:     R07.9 Chest Pain  History:         Patient has prior history of Echocardiogram examinations, most                  recent 12/29/2019. CHF, PVD; Risk Factors:Hypertension,                  Dyslipidemia and Sleep Apnea.  Sonographer:     Charmayne Sheer Referring Phys:  [id number] [place] TANG Diagnosing Phys: Serafina Royals MD  Sonographer Comments: Suboptimal parasternal window and no subcostal  window. IMPRESSIONS  1. Left ventricular ejection fraction, by estimation, is 55 to 60%. The left ventricle has normal function. The left ventricle has no regional wall motion abnormalities. Left ventricular diastolic parameters were normal.  2. Right ventricular systolic function is normal. The right ventricular size is normal.  3. The mitral valve is normal in structure. Mild mitral valve regurgitation.  4. The aortic valve is calcified. Aortic valve regurgitation is mild to moderate. FINDINGS  Left Ventricle: Left ventricular ejection fraction, by estimation, is 55 to 60%. The left ventricle has normal function. The left ventricle has no regional wall motion abnormalities. The left ventricular internal cavity size was normal in size. There is  no left ventricular hypertrophy. Left ventricular diastolic parameters were normal. Right Ventricle: The right ventricular size is normal. No increase in right ventricular wall thickness. Right ventricular systolic function is normal. Left Atrium: Left atrial size was normal in size. Right Atrium: Right atrial size was normal in size. Pericardium: There is no evidence of pericardial effusion. Mitral Valve: The mitral valve is normal in structure. Mild mitral valve regurgitation. Tricuspid Valve:  The tricuspid valve is normal in structure. Tricuspid valve regurgitation is mild. Aortic Valve: The aortic valve is calcified. Aortic valve regurgitation is mild to moderate. Aortic valve mean gradient measures 19.0 mmHg. Aortic valve peak gradient measures 35.0 mmHg. Aortic valve area, by VTI measures 1.29 cm. Pulmonic Valve: The pulmonic valve was normal in structure. Pulmonic valve regurgitation is trivial. Aorta: The aortic root and ascending aorta are structurally normal, with no evidence of dilitation. IAS/Shunts: No atrial level shunt detected by color flow Doppler.  LEFT VENTRICLE PLAX 2D LVIDd:         3.75 cm   Diastology LVIDs:         2.69 cm   LV e' medial:    5.66  cm/s LV PW:         1.18 cm   LV E/e' medial:  11.3 LV IVS:        0.90 cm   LV e' lateral:   5.22 cm/s LVOT diam:     2.20 cm   LV E/e' lateral: 12.2 LV SV:         82 LV SV Index:   41 LVOT Area:     3.80 cm  LEFT ATRIUM             Index LA diam:        3.90 cm 1.93 cm/m LA Vol (A2C):   51.5 ml 25.54 ml/m LA Vol (A4C):   50.5 ml 25.04 ml/m LA Biplane Vol: 54.7 ml 27.13 ml/m  AORTIC VALVE                     PULMONIC VALVE AV Area (Vmax):    1.39 cm      PV Vmax:       1.17 m/s AV Area (Vmean):   1.44 cm      PV Peak grad:  5.5 mmHg AV Area (VTI):     1.29 cm AV Vmax:           296.00 cm/s AV Vmean:          207.000 cm/s AV VTI:            0.633 m AV Peak Grad:      35.0 mmHg AV Mean Grad:      19.0 mmHg LVOT Vmax:         108.00 cm/s LVOT Vmean:        78.600 cm/s LVOT VTI:          0.215 m LVOT/AV VTI ratio: 0.34  AORTA Ao Root diam: 3.30 cm MITRAL VALVE MV Area (PHT): 3.68 cm    SHUNTS MV Decel Time: 206 msec    Systemic VTI:  0.22 m MV E velocity: 63.80 cm/s  Systemic Diam: 2.20 cm MV A velocity: 99.40 cm/s MV E/A ratio:  0.64 Serafina Royals MD Electronically signed by Serafina Royals MD Signature Date/Time: 08/16/2022/12:39:05 PM    Final    DG Chest Portable 1 View  Result Date: 08/15/2022 CLINICAL DATA:  Chest pain.  No improvement with sublingual nitro. EXAM: PORTABLE CHEST 1 VIEW COMPARISON:  PA Lat 09/22/2020. FINDINGS: The heart size and mediastinal contours are within normal limits with mild aortic atherosclerosis. Both lungs are clear. The visualized skeletal structures are unremarkable. IMPRESSION: No radiographic evidence of acute chest disease. Electronically Signed   By: Telford Nab M.D.   On: 08/15/2022 04:46   (Echo, Carotid, EGD, Colonoscopy, ERCP)    Subjective: Pt c/o fatigue  Discharge Exam: Vitals:   08/17/22 1132 08/17/22 1205  BP: (!) 154/60 (!) 129/48  Pulse: 71   Resp: 19   Temp: 98.5 F (36.9 C)   SpO2: 100%    Vitals:   08/17/22 0740 08/17/22 0932  08/17/22 1132 08/17/22 1205  BP: (!) 146/58  (!) 154/60 (!) 129/48  Pulse: 63  71   Resp: 19  19   Temp: 97.9 F (36.6 C)  98.5 F (36.9 C)   TempSrc:   Oral   SpO2: 99%  100%   Weight:  98.6 kg    Height:        General: Pt is alert, awake, not in acute distress Cardiovascular: S1/S2 +, no rubs, no gallops Respiratory: CTA bilaterally, no wheezing, no rhonchi Abdominal: Soft, NT, obese, bowel sounds + Extremities: no cyanosis    The results of significant diagnostics from this hospitalization (including imaging, microbiology, ancillary and laboratory) are listed below for reference.     Microbiology: No results found for this or any previous visit (from the past 240 hour(s)).   Labs: BNP (last 3 results) Recent Labs    10/03/21 1518 08/15/22 0408  BNP 98.8 20.2   Basic Metabolic Panel: Recent Labs  Lab 08/15/22 0408 08/16/22 0559 08/17/22 0811  NA 137 140 140  K 3.7 3.6 4.0  CL 100 113* 110  CO2 24 20* 22  GLUCOSE 97 108* 130*  BUN 76* 55* 55*  CREATININE 3.57* 2.32* 2.33*  CALCIUM 9.4 7.5* 8.9  MG 2.6*  --   --    Liver Function Tests: Recent Labs  Lab 08/15/22 0408  AST 16  ALT 13  ALKPHOS 65  BILITOT 0.8  PROT 7.9  ALBUMIN 4.3   No results for input(s): "LIPASE", "AMYLASE" in the last 168 hours. No results for input(s): "AMMONIA" in the last 168 hours. CBC: Recent Labs  Lab 08/15/22 0408 08/17/22 0811  WBC 9.8 8.4  NEUTROABS 6.4  --   HGB 12.9* 11.4*  HCT 39.8 34.9*  MCV 94.5 95.4  PLT 261 238   Cardiac Enzymes: No results for input(s): "CKTOTAL", "CKMB", "CKMBINDEX", "TROPONINI" in the last 168 hours. BNP: Invalid input(s): "POCBNP" CBG: Recent Labs  Lab 08/16/22 1641 08/16/22 2050 08/16/22 2240 08/17/22 0737 08/17/22 1133  GLUCAP 216* 244* 228* 132* 147*   D-Dimer No results for input(s): "DDIMER" in the last 72 hours. Hgb A1c Recent Labs    08/15/22 0956  HGBA1C 7.5*   Lipid Profile Recent Labs     08/16/22 0559  CHOL 112  HDL 17*  LDLCALC 33  TRIG 311*  CHOLHDL 6.6   Thyroid function studies No results for input(s): "TSH", "T4TOTAL", "T3FREE", "THYROIDAB" in the last 72 hours.  Invalid input(s): "FREET3" Anemia work up No results for input(s): "VITAMINB12", "FOLATE", "FERRITIN", "TIBC", "IRON", "RETICCTPCT" in the last 72 hours. Urinalysis    Component Value Date/Time   COLORURINE YELLOW (A) 12/11/2018 1512   APPEARANCEUR CLEAR (A) 12/11/2018 1512   LABSPEC 1.013 12/11/2018 1512   PHURINE 5.0 12/11/2018 1512   GLUCOSEU NEGATIVE 12/11/2018 1512   HGBUR NEGATIVE 12/11/2018 1512   BILIRUBINUR 1+ 11/01/2020 1512   KETONESUR NEGATIVE 12/11/2018 1512   PROTEINUR Positive (A) 11/01/2020 1512   PROTEINUR NEGATIVE 12/11/2018 1512   UROBILINOGEN 2.0 (A) 11/01/2020 1512   NITRITE positive 11/01/2020 1512   NITRITE NEGATIVE 12/11/2018 1512   LEUKOCYTESUR Large (3+) (A) 11/01/2020 1512   Sepsis Labs Recent Labs  Lab 08/15/22 0408 08/17/22 0811  WBC  9.8 8.4   Microbiology No results found for this or any previous visit (from the past 240 hour(s)).   Time coordinating discharge: Over 30 minutes  SIGNED:   Wyvonnia Dusky, MD  [facility] 08/17/2022, 2:36 PM Pager   If 7PM-7AM, please contact night-coverage

## 2022-08-17 NOTE — Progress Notes (Signed)
Mobility Specialist - Progress Note   08/17/22 1132  Mobility  Activity Ambulated with assistance in hallway;Stood at bedside;Dangled on edge of bed  Level of Assistance Standby assist, set-up cues, supervision of patient - no hands on  Assistive Device None  Distance Ambulated (ft) 120 ft  Activity Response Tolerated well  $Mobility charge 1 Mobility   Pt sitting in chair on RA upon arrival. Pt STS and ambulates in hallway SBA. Pt returns to bed with needs in reach and bed alarm on.   Gretchen Short  Mobility Specialist  08/17/22 11:34 AM

## 2022-08-17 NOTE — Progress Notes (Signed)
Mike Decker       Patient ID: Mike Decker MRN: [id number] DOB/AGE: 15-Oct-1941 81 y.o.  Admit date: 08/15/2022 Referring Physician Dr. Blaine Hamper Primary Physician Dr. Cletis [place] Primary Cardiologist Dr. 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  Cardiology is consulted for further assistance.  Interval History:  - renal function stable from yesterday, improved since admission with IVF -Felt some of his breakfast get stuck in his throat, subsequently vomited this up in addition to his morning medications.  He did not have chest discomfort with this episode. -Reported a 5 out of 10 chest pain at rest that relieved with nitroglycerin x2 without associated nausea, diaphoresis.   Review of systems complete and found to be negative unless listed above     Past Medical History:  Diagnosis Date   Anemia    Arthritis    Atrioventricular canal (AVC)    irregular heart beats   Barrett esophagus    Bronchiolitis    Cancer ([place]) 2002   prostate, esphageal   Chronic diastolic CHF (congestive heart failure) (HCC)    Colon polyp    Diabetes mellitus without complication ([place])    Diverticulosis    Gout    Heart disease    Hemangioma    liver   Hyperlipidemia    Hypertension    Myocardial infarct [facility])    Ocular hypertension    Peripheral vascular disease ([place])    Skin cancer    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[place])    Sleep apnea    Vitreoretinal degeneration     Past Surgical History:  Procedure Laterality Date   CATARACT EXTRACTION  2011, 2012    COLONOSCOPY N/A 12/14/2018   Procedure: COLONOSCOPY;  Surgeon: Virgel Manifold, MD;  Location: [facility] ENDOSCOPY;  Service: Endoscopy;  Laterality: N/A;   CORONARY ANGIOPLASTY WITH STENT PLACEMENT  2012   CORONARY STENT INTERVENTION N/A 12/29/2019   Procedure: CORONARY STENT INTERVENTION;  Surgeon: Nelva Bush, MD;  Location: [place] CV LAB;  Service: Cardiovascular;  Laterality: N/A;   ESOPHAGOGASTRODUODENOSCOPY N/A 12/14/2018   Procedure: ESOPHAGOGASTRODUODENOSCOPY (EGD);  Surgeon: Virgel Manifold, MD;  Location: [facility] ENDOSCOPY;  Service: Endoscopy;  Laterality: N/A;   ESOPHAGOGASTRODUODENOSCOPY (EGD) WITH PROPOFOL N/A 06/01/2016   Procedure: ESOPHAGOGASTRODUODENOSCOPY (EGD) WITH PROPOFOL;  Surgeon: Manya Silvas, MD;  Location: [facility] ENDOSCOPY;  Service: Endoscopy;  Laterality: N/A;   HERNIA REPAIR     x2   INTRAOCULAR LENS INSERTION     LEFT HEART CATH AND CORONARY ANGIOGRAPHY N/A 05/09/2017   Procedure: Left Heart Cath and Coronary Angiography;  Surgeon: Yolonda Kida, MD;  Location: [place] CV LAB;  Service: Cardiovascular;  Laterality: N/A;   LEFT HEART CATH AND CORONARY ANGIOGRAPHY N/A 12/08/2018   Procedure: LEFT HEART CATH AND CORONARY ANGIOGRAPHY and possible PCI and stent;  Surgeon: Yolonda Kida, MD;  Location: [place] CV LAB;  Service: Cardiovascular;  Laterality: N/A;   LEFT HEART CATH AND CORONARY ANGIOGRAPHY N/A 12/29/2019   Procedure: LEFT HEART CATH AND CORONARY ANGIOGRAPHY;  Surgeon: Nelva Bush, MD;  Location: [place] CV LAB;  Service: Cardiovascular;  Laterality: N/A;   NOSE SURGERY     submucous resection   PROSTATE SURGERY  2002   ROTATOR CUFF REPAIR Right     Medications Prior to Admission  Medication Sig Dispense Refill Last Dose   allopurinol (ZYLOPRIM) 100 MG tablet Take 100 mg by mouth daily.   08/14/2022 at 0800   aspirin EC 81 MG tablet Take 81 mg by mouth daily.   08/14/2022 at 0800   azelastine (ASTELIN) 0.1 % nasal  spray USE 1 TO 2 SPRAYS IN EACH NOSTRIL TWICE A DAY   08/14/2022   calcitRIOL (ROCALTROL) 0.25 MCG capsule Take by mouth.   08/14/2022 at 0800   citalopram (CELEXA) 10 MG tablet Take 1 tablet (10 mg total) by mouth daily. 90 tablet 3 08/14/2022 at 0800   clopidogrel (PLAVIX) 75 MG tablet TAKE 1 TABLET BY MOUTH EVERY DAY 90 tablet 1 08/14/2022 at 0800   [employer] VITAMIN B12 1000 MCG tablet TAKE 1 TABLET BY MOUTH EVERY DAY 30 tablet 0 08/14/2022 at 0800   Cyanocobalamin 1000 MCG SUBL Take 1,000 mcg by mouth daily.   08/14/2022   ferrous sulfate 325 (65 FE) MG EC tablet TAKE 1 TABLET BY MOUTH EVERY DAY 90 tablet 3 08/14/2022 at 0800   fluticasone (FLONASE) 50 MCG/ACT nasal spray USE 1 SPRAY IN EACH NOSTRIL EVERY DAY 32 g 6 08/14/2022   hydrALAZINE (APRESOLINE) 50 MG tablet Take 25 mg by mouth 2 (two) times daily.   08/14/2022 at 2000   insulin aspart (NOVOLOG) 100 UNIT/ML FlexPen 15 UNITS IN AM, 25 UNITS AT LUNCH, 25 UNITS AT [facility]   08/14/2022   insulin glargine (LANTUS) 100 UNIT/ML injection Inject 0.35 mLs (35 Units total) into the skin at bedtime. This is a decrease from your previous 45 units nightly. (Patient taking differently: Inject 60 Units into the skin at bedtime. This is a decrease from your previous 45 units nightly.) 10 mL 11 08/14/2022   isosorbide mononitrate (IMDUR) 120 MG 24 hr tablet Take 240 mg by mouth daily.   08/14/2022 at 0800   JARDIANCE 10 MG TABS tablet Take 10 mg by mouth daily.   08/14/2022 at 0800   magnesium oxide (MAG-OX) 400 (240 Mg) MG tablet Take 1 tablet by mouth daily.   08/14/2022 at 0800   methocarbamol (ROBAXIN) 500 MG tablet Take 500 mg by mouth 2 (two) times daily.   0 08/14/2022   metoprolol tartrate (LOPRESSOR) 50 MG tablet Take 50 mg by mouth 2 (two) times daily.   08/14/2022 at 2000   mometasone [facility]) 220 MCG/ACT inhaler Inhale 2 puffs into the lungs daily.   08/14/2022   montelukast (SINGULAIR) 10 MG tablet TAKE 1 TABLET BY MOUTH EVERYDAY AT BEDTIME 90 tablet 1 08/14/2022 at 2000    pantoprazole (PROTONIX) 40 MG tablet Take 1 tablet (40 mg total) by mouth 2 (two) times daily before a meal.   08/14/2022 at 2000   ranolazine (RANEXA) 500 MG 12 hr tablet Take 500 mg by mouth 2 (two) times daily.   08/14/2022 at 2000   rosuvastatin (CRESTOR) 40 MG tablet Take 40 mg by mouth every evening.    08/14/2022 at 2000   sodium bicarbonate 650 MG tablet Take 650 mg by mouth 2 (two) times daily.   08/14/2022 at 2000   sodium fluoride (DENTA 5000 PLUS) 1.1 % CREA dental cream See admin instructions.   Past Week   Tiotropium Bromide-Olodaterol (STIOLTO RESPIMAT) 2.5-2.5 MCG/ACT  AERS Inhale 1 each into the lungs daily.   08/14/2022   torsemide (DEMADEX) 20 MG tablet Take 60 mg by mouth 2 (two) times daily.   08/14/2022   acetaminophen (TYLENOL) 500 MG tablet Take 1,000 mg by mouth every 8 (eight) hours as needed for mild pain.   prn at prn   cetirizine (ZYRTEC) 5 MG tablet Take 5 mg by mouth daily as needed for allergies or rhinitis.    prn at prn   colchicine 0.6 MG tablet Take 3 tablets (1.8 mg total) by mouth daily as needed (gout flares).   prn at prn   dextromethorphan-guaiFENesin (MUCINEX DM) 30-600 MG 12hr tablet Take 1 tablet by mouth 2 (two) times daily as needed for cough. (Patient not taking: Reported on 06/26/2022)      EPINEPHrine (EPI-PEN) 0.3 mg/0.3 mL SOAJ injection Inject into the muscle as needed (anaphylaxis).    prn at prn   Fluticasone-Umeclidin-Vilant (TRELEGY ELLIPTA) 100-62.5-25 MCG/INH AEPB Inhale 1 puff into the lungs daily. (Patient not taking: Reported on 08/15/2022) 3 each 4 Not Taking   folic acid (FOLVITE) 427 MCG tablet TAKE 1 TABLET BY MOUTH DAILY. (Patient not taking: Reported on 08/15/2022) 100 tablet 3 Not Taking   nitroGLYCERIN (NITROSTAT) 0.4 MG SL tablet Place 0.4 mg under the tongue every 5 (five) minutes x 3 doses as needed for chest pain.    prn at prn   Omega-3 Fatty Acids (FISH OIL) 1000 MG CAPS Take 1 capsule by mouth daily.  (Patient not taking: Reported on 06/26/2022)       sacubitril-valsartan (ENTRESTO) 24-26 MG Take 1 tablet by mouth 2 (two) times daily. (Patient not taking: Reported on 06/26/2022) 180 tablet 3    Torsemide 40 MG TABS Take 40 mg by mouth once daily (Patient not taking: Reported on 06/26/2022)   Not Taking   Vitamin D, Ergocalciferol, 50000 units CAPS Take 1 capsule by mouth every 30 (thirty) days.   08/01/2022    Social History   Socioeconomic History   Marital status: Married    Spouse name: Not on file   Number of children: 2   Years of education: College 3 years   Highest education level: Associate degree: occupational, Hotel manager, or vocational program  Occupational History   Occupation: retired  Tobacco Use   Smoking status: Former    Years: 20.00    Types: Cigarettes    Quit date: 12/10/1976    Years since quitting: 45.7   Smokeless tobacco: Never  Substance and Sexual Activity   Alcohol use: No   Drug use: No   Sexual activity: Not Currently  Other Topics Concern   Not on file  Social History Narrative   Not on file   Social Determinants of Health   Financial Resource Strain: Low Risk  (09/15/2021)   Overall Financial Resource Strain (CARDIA)    Difficulty of Paying Living Expenses: Not very hard  Food Insecurity: No Food Insecurity (08/16/2022)   Hunger Vital Sign    Worried About Running Out of Food in the Last Year: Never true    Ran Out of Food in the Last Year: Never true  Transportation Needs: No Transportation Needs (08/16/2022)   PRAPARE - Hydrologist (Medical): No    Lack of Transportation (Non-Medical): No  Physical Activity: Insufficiently Active (09/15/2021)   Exercise Vital Sign    Days of Exercise per Week: 7 days    Minutes of Exercise per Session: 20 min  Stress:  No Stress Concern Present (09/15/2021)   [place]    Feeling of Stress : Not at all  Social Connections: Moderately Isolated (09/15/2021)    Social Connection and Isolation Panel [NHANES]    Frequency of Communication with Friends and Family: Once a week    Frequency of Social Gatherings with Friends and Family: Once a week    Attends Religious Services: Decker than 4 times per year    Active Member of [employer] or Organizations: No    Attends Archivist Meetings: Never    Marital Status: Married  Human resources officer Violence: Not At Risk (08/16/2022)   Humiliation, Afraid, Rape, and Kick questionnaire    Fear of Current or Ex-Partner: No    Emotionally Abused: No    Physically Abused: No    Sexually Abused: No    Family History  Problem Relation Age of Onset   Arthritis Mother    Stroke Maternal Grandfather    Breast cancer Neg Hx       PHYSICAL EXAM General: Pleasant elderly Caucasian male, well nourished, in no acute distress.  At incline in hospital bed. HEENT:  Normocephalic and atraumatic.  Somewhat hard of hearing Neck:  No JVD.   Lungs: Normal respiratory effort on room air.  Decreased breath sounds bilaterally without appreciable crackles or wheezes  heart: HRRR . Normal S1 and S2.  3/6 systolic murmurs.  Best heard at the RUSB. Abdomen: Obese appearing.  Msk: Normal strength and tone for age. Extremities: Warm and well perfused. No clubbing, cyanosis.  No peripheral edema.  Neuro: Alert and oriented X 3. Psych:  Answers questions appropriately.   Labs: Basic Metabolic Panel: Recent Labs    08/15/22 0408 08/16/22 0559 08/17/22 0811  NA 137 140 140  K 3.7 3.6 4.0  CL 100 113* 110  CO2 24 20* 22  GLUCOSE 97 108* 130*  BUN 76* 55* 55*  CREATININE 3.57* 2.32* 2.33*  CALCIUM 9.4 7.5* 8.9  MG 2.6*  --   --     Liver Function Tests: Recent Labs    08/15/22 0408  AST 16  ALT 13  ALKPHOS 65  BILITOT 0.8  PROT 7.9  ALBUMIN 4.3    No results for input(s): "LIPASE", "AMYLASE" in the last 72 hours. CBC: Recent Labs    08/15/22 0408 08/17/22 0811  WBC 9.8 8.4  NEUTROABS 6.4  --   HGB 12.9*  11.4*  HCT 39.8 34.9*  MCV 94.5 95.4  PLT 261 238    Cardiac Enzymes: Recent Labs    08/15/22 0606 08/15/22 0754 08/15/22 0956  TROPONINIHS 24* 31* 41*    BNP: Invalid input(s): "POCBNP" D-Dimer: No results for input(s): "DDIMER" in the last 72 hours. Hemoglobin A1C: Recent Labs    08/15/22 0956  HGBA1C 7.5*    Fasting Lipid Panel: Recent Labs    08/16/22 0559  CHOL 112  HDL 17*  LDLCALC 33  TRIG 311*  CHOLHDL 6.6    Thyroid Function Tests: No results for input(s): "TSH", "T4TOTAL", "T3FREE", "THYROIDAB" in the last 72 hours.  Invalid input(s): "FREET3" Anemia Panel: No results for input(s): "VITAMINB12", "FOLATE", "FERRITIN", "TIBC", "IRON", "RETICCTPCT" in the last 72 hours.  ECHOCARDIOGRAM COMPLETE  Result Date: 08/16/2022    ECHOCARDIOGRAM REPORT   Patient Name:   Mike Decker Date of Exam: 08/15/2022 Medical Rec #:  [id number]            Height:  66.0 in Accession #:    [id number]           Weight:       203.9 lb Date of Birth:  [birth year]            BSA:          2.016 m Patient Age:    45 years             BP:           134/65 mmHg Patient Gender: M                    HR:           74 bpm. Exam Location:  [facility] Procedure: 2D Echo, Color Doppler and Cardiac Doppler Indications:     R07.9 Chest Pain  History:         Patient has prior history of Echocardiogram examinations, most                  recent 12/29/2019. CHF, PVD; Risk Factors:Hypertension,                  Dyslipidemia and Sleep Apnea.  Sonographer:     Charmayne Sheer Referring Phys:  [id number] [place] Dallen Bunte Diagnosing Phys: Serafina Royals MD  Sonographer Comments: Suboptimal parasternal window and no subcostal window. IMPRESSIONS  1. Left ventricular ejection fraction, by estimation, is 55 to 60%. The left ventricle has normal function. The left ventricle has no regional wall motion abnormalities. Left ventricular diastolic parameters were normal.  2. Right ventricular systolic function is normal.  The right ventricular size is normal.  3. The mitral valve is normal in structure. Mild mitral valve regurgitation.  4. The aortic valve is calcified. Aortic valve regurgitation is mild to moderate. FINDINGS  Left Ventricle: Left ventricular ejection fraction, by estimation, is 55 to 60%. The left ventricle has normal function. The left ventricle has no regional wall motion abnormalities. The left ventricular internal cavity size was normal in size. There is  no left ventricular hypertrophy. Left ventricular diastolic parameters were normal. Right Ventricle: The right ventricular size is normal. No increase in right ventricular wall thickness. Right ventricular systolic function is normal. Left Atrium: Left atrial size was normal in size. Right Atrium: Right atrial size was normal in size. Pericardium: There is no evidence of pericardial effusion. Mitral Valve: The mitral valve is normal in structure. Mild mitral valve regurgitation. Tricuspid Valve: The tricuspid valve is normal in structure. Tricuspid valve regurgitation is mild. Aortic Valve: The aortic valve is calcified. Aortic valve regurgitation is mild to moderate. Aortic valve mean gradient measures 19.0 mmHg. Aortic valve peak gradient measures 35.0 mmHg. Aortic valve area, by VTI measures 1.29 cm. Pulmonic Valve: The pulmonic valve was normal in structure. Pulmonic valve regurgitation is trivial. Aorta: The aortic root and ascending aorta are structurally normal, with no evidence of dilitation. IAS/Shunts: No atrial level shunt detected by color flow Doppler.  LEFT VENTRICLE PLAX 2D LVIDd:         3.75 cm   Diastology LVIDs:         2.69 cm   LV e' medial:    5.66 cm/s LV PW:         1.18 cm   LV E/e' medial:  11.3 LV IVS:        0.90 cm   LV e' lateral:   5.22 cm/s LVOT diam:     2.20 cm  LV E/e' lateral: 12.2 LV SV:         82 LV SV Index:   41 LVOT Area:     3.80 cm  LEFT ATRIUM             Index LA diam:        3.90 cm 1.93 cm/m LA Vol (A2C):    51.5 ml 25.54 ml/m LA Vol (A4C):   50.5 ml 25.04 ml/m LA Biplane Vol: 54.7 ml 27.13 ml/m  AORTIC VALVE                     PULMONIC VALVE AV Area (Vmax):    1.39 cm      PV Vmax:       1.17 m/s AV Area (Vmean):   1.44 cm      PV Peak grad:  5.5 mmHg AV Area (VTI):     1.29 cm AV Vmax:           296.00 cm/s AV Vmean:          207.000 cm/s AV VTI:            0.633 m AV Peak Grad:      35.0 mmHg AV Mean Grad:      19.0 mmHg LVOT Vmax:         108.00 cm/s LVOT Vmean:        78.600 cm/s LVOT VTI:          0.215 m LVOT/AV VTI ratio: 0.34  AORTA Ao Root diam: 3.30 cm MITRAL VALVE MV Area (PHT): 3.68 cm    SHUNTS MV Decel Time: 206 msec    Systemic VTI:  0.22 m MV E velocity: 63.80 cm/s  Systemic Diam: 2.20 cm MV A velocity: 99.40 cm/s MV E/A ratio:  0.64 Serafina Royals MD Electronically signed by Serafina Royals MD Signature Date/Time: 08/16/2022/12:39:05 PM    Final      Radiology: ECHOCARDIOGRAM COMPLETE  Result Date: 08/16/2022    ECHOCARDIOGRAM REPORT   Patient Name:   Mike Decker Date of Exam: 08/15/2022 Medical Rec #:  [id number]            Height:       66.0 in Accession #:    [id number]           Weight:       203.9 lb Date of Birth:  1941/09/29            BSA:          2.016 m Patient Age:    47 years             BP:           134/65 mmHg Patient Gender: M                    HR:           74 bpm. Exam Location:  [facility] Procedure: 2D Echo, Color Doppler and Cardiac Doppler Indications:     R07.9 Chest Pain  History:         Patient has prior history of Echocardiogram examinations, most                  recent 12/29/2019. CHF, PVD; Risk Factors:Hypertension,                  Dyslipidemia and Sleep Apnea.  Sonographer:     Charmayne Sheer Referring Phys:  [id number] Tiffannie Sloss MICHELLE  Ysabelle Goodroe Diagnosing Phys: Serafina Royals MD  Sonographer Comments: Suboptimal parasternal window and no subcostal window. IMPRESSIONS  1. Left ventricular ejection fraction, by estimation, is 55 to 60%. The left ventricle has normal function.  The left ventricle has no regional wall motion abnormalities. Left ventricular diastolic parameters were normal.  2. Right ventricular systolic function is normal. The right ventricular size is normal.  3. The mitral valve is normal in structure. Mild mitral valve regurgitation.  4. The aortic valve is calcified. Aortic valve regurgitation is mild to moderate. FINDINGS  Left Ventricle: Left ventricular ejection fraction, by estimation, is 55 to 60%. The left ventricle has normal function. The left ventricle has no regional wall motion abnormalities. The left ventricular internal cavity size was normal in size. There is  no left ventricular hypertrophy. Left ventricular diastolic parameters were normal. Right Ventricle: The right ventricular size is normal. No increase in right ventricular wall thickness. Right ventricular systolic function is normal. Left Atrium: Left atrial size was normal in size. Right Atrium: Right atrial size was normal in size. Pericardium: There is no evidence of pericardial effusion. Mitral Valve: The mitral valve is normal in structure. Mild mitral valve regurgitation. Tricuspid Valve: The tricuspid valve is normal in structure. Tricuspid valve regurgitation is mild. Aortic Valve: The aortic valve is calcified. Aortic valve regurgitation is mild to moderate. Aortic valve mean gradient measures 19.0 mmHg. Aortic valve peak gradient measures 35.0 mmHg. Aortic valve area, by VTI measures 1.29 cm. Pulmonic Valve: The pulmonic valve was normal in structure. Pulmonic valve regurgitation is trivial. Aorta: The aortic root and ascending aorta are structurally normal, with no evidence of dilitation. IAS/Shunts: No atrial level shunt detected by color flow Doppler.  LEFT VENTRICLE PLAX 2D LVIDd:         3.75 cm   Diastology LVIDs:         2.69 cm   LV e' medial:    5.66 cm/s LV PW:         1.18 cm   LV E/e' medial:  11.3 LV IVS:        0.90 cm   LV e' lateral:   5.22 cm/s LVOT diam:     2.20 cm   LV  E/e' lateral: 12.2 LV SV:         82 LV SV Index:   41 LVOT Area:     3.80 cm  LEFT ATRIUM             Index LA diam:        3.90 cm 1.93 cm/m LA Vol (A2C):   51.5 ml 25.54 ml/m LA Vol (A4C):   50.5 ml 25.04 ml/m LA Biplane Vol: 54.7 ml 27.13 ml/m  AORTIC VALVE                     PULMONIC VALVE AV Area (Vmax):    1.39 cm      PV Vmax:       1.17 m/s AV Area (Vmean):   1.44 cm      PV Peak grad:  5.5 mmHg AV Area (VTI):     1.29 cm AV Vmax:           296.00 cm/s AV Vmean:          207.000 cm/s AV VTI:            0.633 m AV Peak Grad:      35.0 mmHg AV Mean Grad:  19.0 mmHg LVOT Vmax:         108.00 cm/s LVOT Vmean:        78.600 cm/s LVOT VTI:          0.215 m LVOT/AV VTI ratio: 0.34  AORTA Ao Root diam: 3.30 cm MITRAL VALVE MV Area (PHT): 3.68 cm    SHUNTS MV Decel Time: 206 msec    Systemic VTI:  0.22 m MV E velocity: 63.80 cm/s  Systemic Diam: 2.20 cm MV A velocity: 99.40 cm/s MV E/A ratio:  0.64 Serafina Royals MD Electronically signed by Serafina Royals MD Signature Date/Time: 08/16/2022/12:39:05 PM    Final    DG Chest Portable 1 View  Result Date: 08/15/2022 CLINICAL DATA:  Chest pain.  No improvement with sublingual nitro. EXAM: PORTABLE CHEST 1 VIEW COMPARISON:  PA Lat 09/22/2020. FINDINGS: The heart size and mediastinal contours are within normal limits with mild aortic atherosclerosis. Both lungs are clear. The visualized skeletal structures are unremarkable. IMPRESSION: No radiographic evidence of acute chest disease. Electronically Signed   By: Telford Nab M.D.   On: 08/15/2022 04:46    ECHO 01/18/2022 normal stress echo without wall motion abnormalities at rest and peak stress.  Moderate aortic stenosis with mean gradient of 20 mmhg on rest echo.  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  Cardiology is consulted for further assistance.  #Chest pain #Moderate aortic stenosis #CAD s/p PCI to proximal RCA, proximal Lcx Presents with 7/10 chest pain that woke him out of his sleep the past 2 nights, relieved with sublingual nitroglycerin, and morphine in the ED.  Occasionally has exertional chest pain relieved with rest.  Most recent echo from 7 months ago revealed moderate aortic stenosis with mean gradient 20 mmHg, for which annual follow-up was recommended.  His most recent [facility] 02/2021 showed patent stents, jailed OM 2 for which aggressive medical therapy was recommended.  He has been on dual antiplatelet therapy.  His troponins are minimally elevated with a flat trend at 22-24-31, less likely ACS, Decker consistent with kidney dysfunction as below.  Suspect his aortic valve may be contributing to his symptoms. -S/p 325 mg aspirin, continue DAPT with 81 mg aspirin daily, Plavix 75 mg daily -Continue antianginals with Imdur 240 mg once daily -Continue metoprolol XL  -Continue Crestor '10mg'$  once daily, goal to increase as renal function improves (TC 112, HDL 17, LDL 33, triglycerides 311, VLDL 62) consider addition of fenofibrate  -Restart Ranexa at 500 mg twice daily today -Echocardiogram complete resulted with similar mild to moderate aortic stenosis compared to prior study from 01/2022 (mean gradient 19 mmHg)  #AKI on CKD4 Concern for overdiuresis, nephrology following, appreciate recommendations. -Nephrology resumed torsemide 40 mg on Monday  Wednesday and Friday  #Chronic HFpEF BNP negative at 88.  Clinically euvolemic. -monitor closely with IVF  No further cardiac diagnostics necessary, he is okay for discharge today from a cardiac standpoint, follow-up with Dr. Clayborn Bigness in 1 to 2 weeks  This patient's plan of care was discussed and created with Dr. Nehemiah Massed and he is in agreement.  Signed: Tristan Schroeder , PA-C 08/17/2022, 11:50 AM [facility] Cardiology

## 2022-08-17 NOTE — TOC Initial Note (Signed)
Transition of Care [facility]) - Initial/Assessment Note    Patient Details  Name: Mike Decker MRN: [id number] Date of Birth: June 06, 1941  Transition [facility]) CM/SW Contact:    Alberteen Sam, LCSW Phone Number: 08/17/2022, 10:26 AM  Clinical Narrative:                  CSW met with patient at bedside to complete readmission risk assessment. Patient reports he continues to see PCP Dr. Lavera Guise, pharmacy is [facility] [place] [facility] well as [facility]. Patient reports he uses bus sometimes for transportation, lives at home with wife who uses a walker. Patient reports being independent at baseline and identifies no needs at this time.    Expected Discharge Plan: Home/Self Care Barriers to Discharge: Continued Medical Work up   Patient Goals and CMS Choice Patient states their goals for this hospitalization and ongoing recovery are:: to go home CMS Medicare.gov Compare Post Acute Care list provided to:: Patient Choice offered to / list presented to : Patient  Expected Discharge Plan and Services Expected Discharge Plan: Home/Self Care       Living arrangements for the past 2 months: Single Family Home                                      Prior Living Arrangements/Services Living arrangements for the past 2 months: Single Family Home Lives with:: Spouse                   Activities of Daily Living Home Assistive Devices/Equipment: Eyeglasses ADL Screening (condition at time of admission) Patient's cognitive ability adequate to safely complete daily activities?: Yes Is the patient deaf or have difficulty hearing?: No Does the patient have difficulty seeing, even when wearing glasses/contacts?: No Does the patient have difficulty concentrating, remembering, or making decisions?: No Patient able to express need for assistance with ADLs?: Yes Does the patient have difficulty dressing or bathing?: No Independently performs ADLs?: Yes (appropriate for  developmental age) Does the patient have difficulty walking or climbing stairs?: No Weakness of Legs: None Weakness of Arms/Hands: None  Permission Sought/Granted                  Emotional Assessment   Attitude/Demeanor/Rapport: Gracious Affect (typically observed): Calm Orientation: : Oriented to Self, Oriented to  Time, Oriented to Situation, Oriented to Place Alcohol / Substance Use: Not Applicable Psych Involvement: No (comment)  Admission diagnosis:  Other chest pain [R07.89] NSTEMI (non-ST elevated myocardial infarction) ([place]) [I21.4] Chest pain [R07.9] Patient Active Problem List   Diagnosis Date Noted   Type II diabetes mellitus with renal manifestations ([place]) 08/15/2022   Depression 08/15/2022   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[place]) 06/28/2021   Urinary tract infection without hematuria 11/01/2020   Dysuria 11/01/2020   Hyposmolality and/or hyponatremia 09/14/2020   Cough 09/13/2020   Bronchitis 07/15/2020   Palpitations 07/15/2020   Aortic atherosclerosis ([place]) 06/21/2020   Heart failure ([place]) 06/21/2020   Otalgia of both ears 05/27/2020   Lobar pneumonia ([place])    Type 2 diabetes mellitus with stage 4 chronic kidney disease, without long-term current use of insulin ([place]) 12/14/2019   Elevated troponin    Acidosis 10/30/2019   Benign 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 10/30/2019   Hyperkalemia 10/30/2019   Secondary hyperparathyroidism of renal origin ([place]) 10/30/2019   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  Encounter for fitting and adjustment of hearing aid 06/16/2019   Health maintenance alteration 06/16/2019   Prostate cancer ([place]) 06/16/2019   DM type 2 with diabetic peripheral neuropathy ([place]) 01/25/2019   CKD (chronic kidney disease) stage 4, GFR 15-29 ml/min (HCC) 01/21/2019   Gout 01/21/2019   MR (mitral regurgitation) 01/21/2019   Chronic obstructive pulmonary disease ([place]) 01/08/2019   OSA (obstructive  sleep apnea) 01/08/2019   Chronic diastolic heart failure ([place]) 12/30/2018   Polyp of sigmoid colon    Benign neoplasm of ascending colon    Hemorrhoids without complication    Diverticulosis of large intestine without diverticulitis    Reflux esophagitis    Lymphangiectasia    Arteriovenous malformation of duodenum    Symptomatic anemia    SOB (shortness of breath)    Iron deficiency anemia due to chronic blood loss    Unstable angina (HCC)    Anemia of chronic renal failure, stage 4 (severe) ([place])    Chest pain 11/14/2018   Sciatica 11/12/2018   CAD (coronary artery disease) 02/27/2018   Sacroiliac joint pain 10/08/2017   Pain in limb 09/24/2017   Neuropathy 07/24/2017   NSTEMI (non-ST elevated myocardial infarction) ([place]) 05/06/2017   PAD (peripheral artery disease) ([place]) 05/01/2017   History of stroke 01/02/2017   Barrett's esophagus 10/02/2016   Carotid stenosis 10/02/2016   Essential hypertension 10/02/2016   Hyperlipidemia 10/02/2016   Diabetes ([place]) 10/02/2016   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

## 2022-08-20 ENCOUNTER — Telehealth: Payer: Self-pay | Admitting: *Deleted

## 2022-08-20 NOTE — Patient Outreach (Signed)
  Care Coordination [facility] Note Transition Care Management Follow-up Telephone Call Date of discharge and from where: 08/17/22 [facility] How have you been since you were released from the hospital? Wife states the patient is doing well. Any questions or concerns? No  Items Reviewed: Did the pt receive and understand the discharge instructions provided? Yes  Medications obtained and verified? Yes  Other? No  Any new allergies since your discharge? No  Dietary orders reviewed? Yes Do you have support at home? Yes   Home Care and Equipment/Supplies: Were home health services ordered? no If so, what is the name of the agency? N/A  Has the agency set up a time to come to the patient's home? not applicable Were any new equipment or medical supplies ordered?  No What is the name of the medical supply agency? N/A Were you able to get the supplies/equipment? not applicable Do you have any questions related to the use of the equipment or supplies? No  Functional Questionnaire: (I = Independent and D = Dependent) ADLs: I  Bathing/Dressing- I  Meal Prep- D  Eating- I  Maintaining continence- I  Transferring/Ambulation- I  Managing Meds- I  Follow up appointments reviewed:  PCP Hospital f/u appt confirmed? No , wife reports she is calling both PCP and cardiology to schedule appointments.  [facility] f/u appt confirmed? Yes  Scheduled to see Dr. Candiss Norse on 08/21/22 @ 0900. Are transportation arrangements needed? No  If their condition worsens, is the pt aware to call PCP or go to the Emergency Dept.? Yes Was the patient provided with contact information for the PCP's office or ED? Yes Was to pt encouraged to call back with questions or concerns? Yes  SDOH assessments and interventions completed:   No  Care Coordination Interventions Activated:  No   Care Coordination Interventions:   N/A     Encounter Outcome:  Pt. Visit Completed    Emelia Loron RN, BSN [place] [phone] [email or web address]

## 2022-08-21 DIAGNOSIS — I10 Essential (primary) hypertension: Secondary | ICD-10-CM | POA: Diagnosis not present

## 2022-08-21 DIAGNOSIS — E1122 Type 2 diabetes mellitus with diabetic chronic kidney disease: Secondary | ICD-10-CM | POA: Diagnosis not present

## 2022-08-21 DIAGNOSIS — N184 Chronic kidney disease, stage 4 (severe): Secondary | ICD-10-CM | POA: Diagnosis not present

## 2022-08-21 DIAGNOSIS — N2581 Secondary hyperparathyroidism of renal origin: Secondary | ICD-10-CM | POA: Diagnosis not present

## 2022-08-24 DIAGNOSIS — N1832 Chronic kidney disease, stage 3b: Secondary | ICD-10-CM | POA: Diagnosis not present

## 2022-08-24 DIAGNOSIS — R809 Proteinuria, unspecified: Secondary | ICD-10-CM | POA: Diagnosis not present

## 2022-08-24 DIAGNOSIS — E1159 Type 2 diabetes mellitus with other circulatory complications: Secondary | ICD-10-CM | POA: Diagnosis not present

## 2022-08-24 DIAGNOSIS — E1129 Type 2 diabetes mellitus with other diabetic kidney complication: Secondary | ICD-10-CM | POA: Diagnosis not present

## 2022-08-24 DIAGNOSIS — E1165 Type 2 diabetes mellitus with hyperglycemia: Secondary | ICD-10-CM | POA: Diagnosis not present

## 2022-08-24 DIAGNOSIS — Z794 Long term (current) use of insulin: Secondary | ICD-10-CM | POA: Diagnosis not present

## 2022-08-24 DIAGNOSIS — E1122 Type 2 diabetes mellitus with diabetic chronic kidney disease: Secondary | ICD-10-CM | POA: Diagnosis not present

## 2022-08-24 DIAGNOSIS — Z9289 Personal history of other medical treatment: Secondary | ICD-10-CM | POA: Diagnosis not present

## 2022-08-24 DIAGNOSIS — E1142 Type 2 diabetes mellitus with diabetic polyneuropathy: Secondary | ICD-10-CM | POA: Diagnosis not present

## 2022-08-29 DIAGNOSIS — E1159 Type 2 diabetes mellitus with other circulatory complications: Secondary | ICD-10-CM | POA: Diagnosis not present

## 2022-08-30 DIAGNOSIS — I2510 Atherosclerotic heart disease of native coronary artery without angina pectoris: Secondary | ICD-10-CM | POA: Diagnosis not present

## 2022-08-30 DIAGNOSIS — N184 Chronic kidney disease, stage 4 (severe): Secondary | ICD-10-CM | POA: Diagnosis not present

## 2022-08-30 DIAGNOSIS — Z8673 Personal history of transient ischemic attack (TIA), and cerebral infarction without residual deficits: Secondary | ICD-10-CM | POA: Diagnosis not present

## 2022-08-30 DIAGNOSIS — J449 Chronic obstructive pulmonary disease, unspecified: Secondary | ICD-10-CM | POA: Diagnosis not present

## 2022-08-30 DIAGNOSIS — I700 Atherosclerosis of aorta: Secondary | ICD-10-CM | POA: Diagnosis not present

## 2022-08-30 DIAGNOSIS — I214 Non-ST elevation (NSTEMI) myocardial infarction: Secondary | ICD-10-CM | POA: Diagnosis not present

## 2022-08-30 DIAGNOSIS — I5032 Chronic diastolic (congestive) heart failure: Secondary | ICD-10-CM | POA: Diagnosis not present

## 2022-08-30 DIAGNOSIS — E119 Type 2 diabetes mellitus without complications: Secondary | ICD-10-CM | POA: Diagnosis not present

## 2022-08-30 DIAGNOSIS — I739 Peripheral vascular disease, unspecified: Secondary | ICD-10-CM | POA: Diagnosis not present

## 2022-08-30 DIAGNOSIS — R002 Palpitations: Secondary | ICD-10-CM | POA: Diagnosis not present

## 2022-08-30 DIAGNOSIS — Z955 Presence of coronary angioplasty implant and graft: Secondary | ICD-10-CM | POA: Diagnosis not present

## 2022-08-30 DIAGNOSIS — I6523 Occlusion and stenosis of bilateral carotid arteries: Secondary | ICD-10-CM | POA: Diagnosis not present

## 2022-09-17 DIAGNOSIS — E1122 Type 2 diabetes mellitus with diabetic chronic kidney disease: Secondary | ICD-10-CM | POA: Diagnosis not present

## 2022-09-17 DIAGNOSIS — E1129 Type 2 diabetes mellitus with other diabetic kidney complication: Secondary | ICD-10-CM | POA: Diagnosis not present

## 2022-09-17 DIAGNOSIS — Z794 Long term (current) use of insulin: Secondary | ICD-10-CM | POA: Diagnosis not present

## 2022-09-17 DIAGNOSIS — N1832 Chronic kidney disease, stage 3b: Secondary | ICD-10-CM | POA: Diagnosis not present

## 2022-09-17 DIAGNOSIS — E1142 Type 2 diabetes mellitus with diabetic polyneuropathy: Secondary | ICD-10-CM | POA: Diagnosis not present

## 2022-09-17 DIAGNOSIS — E1159 Type 2 diabetes mellitus with other circulatory complications: Secondary | ICD-10-CM | POA: Diagnosis not present

## 2022-09-17 DIAGNOSIS — R809 Proteinuria, unspecified: Secondary | ICD-10-CM | POA: Diagnosis not present

## 2022-09-30 LAB — GLUCOSE, POCT (MANUAL RESULT ENTRY): POC Glucose: 196 mg/dl — AB (ref 70–99)

## 2022-10-15 ENCOUNTER — Telehealth: Payer: Self-pay

## 2022-10-15 NOTE — Patient Outreach (Signed)
  Care Coordination   Initial Visit Note   10/15/2022 Name: Mike Decker MRN: [id number] DOB: 1941-04-11  Mike Decker is a 81 y.o. year old male who sees Mike Athens, MD for primary care. I  spoke with spouse, Mike Decker  What matters to the patients health and wellness today?  Wife states patient does not have any nursing or community resource needs at this time.     Goals Addressed             This Visit's Progress    COMPLETED: care coordination - no follow up needed       Care coordination program/ services discussed  Social determinants of health survey completed Patient advised to contact primary care provider office  if care coordination services needed in the future         SDOH assessments and interventions completed:  Yes  SDOH Interventions Today    Flowsheet Row Most Recent Value  SDOH Interventions   Food Insecurity Interventions Intervention Not Indicated  Housing Interventions Intervention Not Indicated  Transportation Interventions Intervention Not Indicated        Care Coordination Interventions Activated:  No  Care Coordination Interventions:  No, not indicated   Follow up plan: No further intervention required.   Encounter Outcome:  Pt. Visit Completed

## 2022-10-24 DIAGNOSIS — N2581 Secondary hyperparathyroidism of renal origin: Secondary | ICD-10-CM | POA: Diagnosis not present

## 2022-10-24 DIAGNOSIS — E1122 Type 2 diabetes mellitus with diabetic chronic kidney disease: Secondary | ICD-10-CM | POA: Diagnosis not present

## 2022-10-24 DIAGNOSIS — I10 Essential (primary) hypertension: Secondary | ICD-10-CM | POA: Diagnosis not present

## 2022-10-24 DIAGNOSIS — N184 Chronic kidney disease, stage 4 (severe): Secondary | ICD-10-CM | POA: Diagnosis not present

## 2022-10-29 ENCOUNTER — Encounter: Payer: Self-pay | Admitting: Physician Assistant

## 2022-10-29 ENCOUNTER — Ambulatory Visit (INDEPENDENT_AMBULATORY_CARE_PROVIDER_SITE_OTHER): Payer: Medicare Other | Admitting: Physician Assistant

## 2022-10-29 VITALS — BP 137/62 | HR 69 | Temp 97.9°F | Resp 16 | Ht 66.0 in | Wt 197.8 lb

## 2022-10-29 DIAGNOSIS — J984 Other disorders of lung: Secondary | ICD-10-CM

## 2022-10-29 DIAGNOSIS — I5022 Chronic systolic (congestive) heart failure: Secondary | ICD-10-CM | POA: Diagnosis not present

## 2022-10-29 DIAGNOSIS — R0602 Shortness of breath: Secondary | ICD-10-CM

## 2022-10-29 DIAGNOSIS — I10 Essential (primary) hypertension: Secondary | ICD-10-CM | POA: Diagnosis not present

## 2022-10-29 DIAGNOSIS — J4520 Mild intermittent asthma, uncomplicated: Secondary | ICD-10-CM

## 2022-10-29 DIAGNOSIS — G4733 Obstructive sleep apnea (adult) (pediatric): Secondary | ICD-10-CM | POA: Diagnosis not present

## 2022-10-29 NOTE — Progress Notes (Signed)
[facility] [place], [place] [address]  Pulmonary Sleep Medicine   Office Visit Note  Patient Name: Mike Decker DOB: 1940/12/28 MRN [id number]  Date of Service: 11/07/2022  Complaints/HPI: Pt is here for routine pulmonary follow up. Went to hospital for CP back in sept and was found to have elevated creatinine which they are now monitoring. He states that Trelegy is veyr expensive and was given Asmanex and stiolto together in place of trelegy by another provider recently due to cost. Finds this works well for him. Breathing has been good lately. Went to dentist for a cavity and ended up having a tooth pulled and is on clindamycin currently. Stopped entresto.  ROS  General: (-) fever, (-) chills, (-) night sweats, (-) weakness Skin: (-) rashes, (-) itching,. Eyes: (-) visual changes, (-) redness, (-) itching. Nose and Sinuses: (-) nasal stuffiness or itchiness, (-) postnasal drip, (-) nosebleeds, (-) sinus trouble. Mouth and Throat: (-) sore throat, (-) hoarseness. Neck: (-) swollen glands, (-) enlarged thyroid, (-) neck pain. Respiratory: + cough, (-) bloody sputum, + shortness of breath, - wheezing. Cardiovascular: - ankle swelling, (-) chest pain. Lymphatic: (-) lymph node enlargement. Neurologic: (-) numbness, (-) tingling. Psychiatric: (-) anxiety, (-) depression   Current Medication: Outpatient Encounter Medications as of 10/29/2022  Medication Sig Note   acetaminophen (TYLENOL) 500 MG tablet Take 1,000 mg by mouth every 8 (eight) hours as needed for mild pain.    allopurinol (ZYLOPRIM) 100 MG tablet Take 100 mg by mouth daily.    aspirin EC 81 MG tablet Take 81 mg by mouth daily.    azelastine (ASTELIN) 0.1 % nasal spray USE 1 TO 2 SPRAYS IN EACH NOSTRIL TWICE A DAY    calcitRIOL (ROCALTROL) 0.25 MCG capsule Take by mouth.    cetirizine (ZYRTEC) 5 MG tablet Take 5 mg by mouth daily as needed for allergies or rhinitis.     citalopram  (CELEXA) 10 MG tablet Take 1 tablet (10 mg total) by mouth daily.    clopidogrel (PLAVIX) 75 MG tablet TAKE 1 TABLET BY MOUTH EVERY DAY    colchicine 0.6 MG tablet Take 3 tablets (1.8 mg total) by mouth daily as needed (gout flares).    [employer] VITAMIN B12 1000 MCG tablet TAKE 1 TABLET BY MOUTH EVERY DAY 08/15/2022: Sunday, Tuesday and Thursday only   Cyanocobalamin 1000 MCG SUBL Take 1,000 mcg by mouth daily.    EPINEPHrine (EPI-PEN) 0.3 mg/0.3 mL SOAJ injection Inject into the muscle as needed (anaphylaxis).     ferrous sulfate 325 (65 FE) MG EC tablet TAKE 1 TABLET BY MOUTH EVERY DAY    fluticasone (FLONASE) 50 MCG/ACT nasal spray USE 1 SPRAY IN EACH NOSTRIL EVERY DAY    hydrALAZINE (APRESOLINE) 50 MG tablet Take 25 mg by mouth 2 (two) times daily.    insulin aspart (NOVOLOG) 100 UNIT/ML FlexPen 15 UNITS IN AM, 25 UNITS AT LUNCH, 25 UNITS AT DINNER    insulin glargine (LANTUS) 100 UNIT/ML injection Inject 0.35 mLs (35 Units total) into the skin at bedtime. This is a decrease from your previous 45 units nightly. (Patient taking differently: Inject 60 Units into the skin at bedtime. This is a decrease from your previous 45 units nightly.)    isosorbide mononitrate (IMDUR) 120 MG 24 hr tablet Take 240 mg by mouth daily.    JARDIANCE 10 MG TABS tablet Take 10 mg by mouth daily.    magnesium oxide (MAG-OX) 400 (240 Mg) MG tablet  Take 1 tablet by mouth daily.    methocarbamol (ROBAXIN) 500 MG tablet Take 500 mg by mouth 2 (two) times daily.     mometasone (ASMANEX) 220 MCG/ACT inhaler Inhale 2 puffs into the lungs daily.    montelukast (SINGULAIR) 10 MG tablet TAKE 1 TABLET BY MOUTH EVERYDAY AT BEDTIME    nitroGLYCERIN (NITROSTAT) 0.4 MG SL tablet Place 0.4 mg under the tongue every 5 (five) minutes x 3 doses as needed for chest pain.     pantoprazole (PROTONIX) 40 MG tablet Take 1 tablet (40 mg total) by mouth 2 (two) times daily before a meal.    ranolazine (RANEXA) 500 MG 12 hr tablet Take 500 mg by  mouth 2 (two) times daily.    sodium bicarbonate 650 MG tablet Take 650 mg by mouth 2 (two) times daily.    sodium fluoride (DENTA 5000 PLUS) 1.1 % CREA dental cream See admin instructions.    Tiotropium Bromide-Olodaterol (STIOLTO RESPIMAT) 2.5-2.5 MCG/ACT AERS Inhale 1 each into the lungs daily.    torsemide 40 MG TABS Take 40 mg by mouth 3 (three) times a week. Take once on MWF (Patient taking differently: Take 40 mg by mouth daily. Daily)    Vitamin D, Ergocalciferol, 50000 units CAPS Take 1 capsule by mouth every 30 (thirty) days.    [DISCONTINUED] sacubitril-valsartan (ENTRESTO) 24-26 MG Take 1 tablet by mouth 2 (two) times daily. Hold this medication until you see your cardiologist and/or nephrologist    metoprolol succinate (TOPROL-XL) 50 MG 24 hr tablet Take 1 tablet (50 mg total) by mouth daily. Take with or immediately following a meal.    rosuvastatin (CRESTOR) 10 MG tablet Take 1 tablet (10 mg total) by mouth at bedtime.    No facility-administered encounter medications on file as of 10/29/2022.    Surgical History: Past Surgical History:  Procedure Laterality Date   CATARACT EXTRACTION  2011, 2012   COLONOSCOPY N/A 12/14/2018   Procedure: COLONOSCOPY;  Surgeon: Virgel Manifold, MD;  Location: [facility] ENDOSCOPY;  Service: Endoscopy;  Laterality: N/A;   CORONARY ANGIOPLASTY WITH STENT PLACEMENT  2012   CORONARY STENT INTERVENTION N/A 12/29/2019   Procedure: CORONARY STENT INTERVENTION;  Surgeon: Nelva Bush, MD;  Location: [place] [facility];  Service: Cardiovascular;  Laterality: N/A;   ESOPHAGOGASTRODUODENOSCOPY N/A 12/14/2018   Procedure: ESOPHAGOGASTRODUODENOSCOPY (EGD);  Surgeon: Virgel Manifold, MD;  Location: [facility] ENDOSCOPY;  Service: Endoscopy;  Laterality: N/A;   ESOPHAGOGASTRODUODENOSCOPY (EGD) WITH PROPOFOL N/A 06/01/2016   Procedure: ESOPHAGOGASTRODUODENOSCOPY (EGD) WITH PROPOFOL;  Surgeon: Manya Silvas, MD;  Location: [facility] ENDOSCOPY;  Service: Endoscopy;   Laterality: N/A;   HERNIA REPAIR     x2   INTRAOCULAR LENS INSERTION     LEFT HEART CATH AND CORONARY ANGIOGRAPHY N/A 05/09/2017   Procedure: Left Heart Cath and Coronary Angiography;  Surgeon: Yolonda Kida, MD;  Location: [place] [facility];  Service: Cardiovascular;  Laterality: N/A;   LEFT HEART CATH AND CORONARY ANGIOGRAPHY N/A 12/08/2018   Procedure: LEFT HEART CATH AND CORONARY ANGIOGRAPHY and possible PCI and stent;  Surgeon: Yolonda Kida, MD;  Location: [place] [facility];  Service: Cardiovascular;  Laterality: N/A;   LEFT HEART CATH AND CORONARY ANGIOGRAPHY N/A 12/29/2019   Procedure: LEFT HEART CATH AND CORONARY ANGIOGRAPHY;  Surgeon: Nelva Bush, MD;  Location: [place] [facility];  Service: Cardiovascular;  Laterality: N/A;   NOSE SURGERY     submucous resection   PROSTATE SURGERY  2002   ROTATOR  CUFF REPAIR Right     Medical History: Past Medical History:  Diagnosis Date   Anemia    Arthritis    Atrioventricular canal (AVC)    irregular heart beats   Barrett esophagus    Bronchiolitis    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, esphageal   Chronic diastolic CHF (congestive heart failure) (HCC)    Colon polyp    Diabetes mellitus without complication (HCC)    Diverticulosis    Gout    Heart disease    Hemangioma    liver   Hyperlipidemia    Hypertension    Myocardial infarct ([place])    Ocular hypertension    Peripheral vascular disease ([place])    Skin cancer    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[place])    Sleep apnea    Vitreoretinal degeneration     Family History: Family History  Problem Relation Age of Onset   Arthritis Mother    Stroke Maternal Grandfather    Breast cancer Neg Hx     Social History: Social History   Socioeconomic History   Marital status: Married    Spouse name: Not on file   Number of children: 2   Years of education: College 3 years   Highest education level: Associate degree: occupational, Hotel manager, or vocational program  Occupational  History   Occupation: retired  Tobacco Use   Smoking status: Former    Years: 20.00    Types: Cigarettes    Quit date: 12/10/1976    Years since quitting: 45.9   Smokeless tobacco: Never  Substance and Sexual Activity   Alcohol use: No   Drug use: No   Sexual activity: Not Currently  Other Topics Concern   Not on file  Social History Narrative   Not on file   Social Determinants of Health   Financial Resource Strain: Low Risk  (09/15/2021)   Overall Financial Resource Strain (CARDIA)    Difficulty of Paying Living Expenses: Not very hard  Food Insecurity: No Food Insecurity (10/15/2022)   Hunger Vital Sign    Worried About Running Out of Food in the Last Year: Never true    Ran Out of Food in the Last Year: Never true  Transportation Needs: No Transportation Needs (10/15/2022)   PRAPARE - Hydrologist (Medical): No    Lack of Transportation (Non-Medical): No  Physical Activity: Insufficiently Active (09/15/2021)   Exercise Vital Sign    Days of Exercise per Week: 7 days    Minutes of Exercise per Session: 20 min  Stress: No Stress Concern Present (09/15/2021)   [place]    Feeling of Stress : Not at all  Social Connections: Moderately Isolated (09/15/2021)   Social Connection and Isolation Panel [NHANES]    Frequency of Communication with Friends and Family: Once a week    Frequency of Social Gatherings with Friends and Family: Once a week    Attends Religious Services: More than 4 times per year    Active Member of [employer] or Organizations: No    Attends Archivist Meetings: Never    Marital Status: Married  Human resources officer Violence: Not At Risk (08/16/2022)   Humiliation, Afraid, Rape, and Kick questionnaire    Fear of Current or Ex-Partner: No    Emotionally Abused: No    Physically Abused: No    Sexually Abused: No    Vital Signs: Blood pressure 137/62, pulse 69,  temperature 97.9  F (36.6 C), resp. rate 16, height '5\' 6"'$  (1.676 m), weight 197 lb 12.8 oz (89.7 kg), SpO2 98 %.  Examination: General Appearance: The patient is well-developed, well-nourished, and in no distress. Skin: Gross inspection of skin unremarkable. Head: normocephalic, no gross deformities. Eyes: no gross deformities noted. ENT: ears appear grossly normal no exudates. Neck: Supple. No thyromegaly. No LAD. Respiratory: Lungs clear to auscultation bilaterally. Cardiovascular: Normal S1 and S2 without murmur or rub. Extremities: No cyanosis. pulses are equal. Neurologic: Alert and oriented. No involuntary movements.  LABS: Recent Results (from the past 2160 hour(s))  Comprehensive metabolic panel     Status: Abnormal   Collection Time: 08/15/22  4:08 AM  Result Value Ref Range   Sodium 137 135 - 145 mmol/L   Potassium 3.7 3.5 - 5.1 mmol/L   Chloride 100 98 - 111 mmol/L   CO2 24 22 - 32 mmol/L   Glucose, Bld 97 70 - 99 mg/dL    Comment: Glucose reference range applies only to samples taken after fasting for at least 8 hours.   BUN 76 (H) 8 - 23 mg/dL   Creatinine, Ser 3.57 (H) 0.61 - 1.24 mg/dL   Calcium 9.4 8.9 - 10.3 mg/dL   Total Protein 7.9 6.5 - 8.1 g/dL   Albumin 4.3 3.5 - 5.0 g/dL   AST 16 15 - 41 U/L   ALT 13 0 - 44 U/L   Alkaline Phosphatase 65 38 - 126 U/L   Total Bilirubin 0.8 0.3 - 1.2 mg/dL   GFR, Estimated 16 (L) >60 mL/min    Comment: (NOTE) Calculated using the CKD-EPI Creatinine Equation (2021)    Anion gap 13 5 - 15    Comment: Performed at [facility], [address] [place], [place] [address]  CBC with Differential/Platelet     Status: Abnormal   Collection Time: 08/15/22  4:08 AM  Result Value Ref Range   WBC 9.8 4.0 - 10.5 K/uL   RBC 4.21 (L) 4.22 - 5.81 MIL/uL   Hemoglobin 12.9 (L) 13.0 - 17.0 g/dL   HCT 39.8 39.0 - 52.0 %   MCV 94.5 80.0 - 100.0 fL   MCH 30.6 26.0 - 34.0 pg   MCHC 32.4 30.0 - 36.0 g/dL   RDW 14.2 11.5 - 15.5  %   Platelets 261 150 - 400 K/uL   nRBC 0.0 0.0 - 0.2 %   Neutrophils Relative % 64 %   Neutro Abs 6.4 1.7 - 7.7 K/uL   Lymphocytes Relative 14 %   Lymphs Abs 1.4 0.7 - 4.0 K/uL   Monocytes Relative 12 %   Monocytes Absolute 1.2 (H) 0.1 - 1.0 K/uL   Eosinophils Relative 8 %   Eosinophils Absolute 0.7 (H) 0.0 - 0.5 K/uL   Basophils Relative 1 %   Basophils Absolute 0.1 0.0 - 0.1 K/uL   Immature Granulocytes 1 %   Abs Immature Granulocytes 0.09 (H) 0.00 - 0.07 K/uL    Comment: Performed at [facility], [address] [place], [place] [address]  Brain natriuretic peptide     Status: None   Collection Time: 08/15/22  4:08 AM  Result Value Ref Range   B Natriuretic Peptide 88.8 0.0 - 100.0 pg/mL    Comment: Performed at [facility], [address] [place],  [address]  Troponin I (High Sensitivity)     Status: Abnormal   Collection Time: 08/15/22  4:08 AM  Result Value Ref Range   Troponin I (  High Sensitivity) 22 (H) <18 ng/L    Comment: (NOTE) Elevated high sensitivity troponin I (hsTnI) values and significant  changes across serial measurements may suggest ACS but many other  chronic and acute conditions are known to elevate hsTnI results.  Refer to the "Links" section for chest pain algorithms and additional  guidance. Performed at [facility], [address] [place], [place] [address]   Magnesium     Status: Abnormal   Collection Time: 08/15/22  4:08 AM  Result Value Ref Range   Magnesium 2.6 (H) 1.7 - 2.4 mg/dL    Comment: Performed at [facility], [place], [place] [address]  Troponin I (High Sensitivity)     Status: Abnormal   Collection Time: 08/15/22  6:06 AM  Result Value Ref Range   Troponin I (High Sensitivity) 24 (H) <18 ng/L    Comment: (NOTE) Elevated high sensitivity troponin I (hsTnI) values and significant  changes across serial measurements may suggest ACS but many other  chronic and acute  conditions are known to elevate hsTnI results.  Refer to the "Links" section for chest pain algorithms and additional  guidance. Performed at [facility], [place], [place] [address]   Troponin I (High Sensitivity)     Status: Abnormal   Collection Time: 08/15/22  7:54 AM  Result Value Ref Range   Troponin I (High Sensitivity) 31 (H) <18 ng/L    Comment: (NOTE) Elevated high sensitivity troponin I (hsTnI) values and significant  changes across serial measurements may suggest ACS but many other  chronic and acute conditions are known to elevate hsTnI results.  Refer to the "Links" section for chest pain algorithms and additional  guidance. Performed at [facility], [address] [place], [place] [address]   Protime-INR     Status: None   Collection Time: 08/15/22  7:54 AM  Result Value Ref Range   Prothrombin Time 14.2 11.4 - 15.2 seconds   INR 1.1 0.8 - 1.2    Comment: (NOTE) INR goal varies based on device and disease states. Performed at [facility], [address] [place], [place] [address]   Hemoglobin A1c     Status: Abnormal   Collection Time: 08/15/22  9:56 AM  Result Value Ref Range   Hgb A1c MFr Bld 7.5 (H) 4.8 - 5.6 %    Comment: (NOTE) Pre diabetes:          5.7%-6.4%  Diabetes:              >6.4%  Glycemic control for   <7.0% adults with diabetes    Mean Plasma Glucose 168.55 mg/dL    Comment: Performed at [facility] [address] [place], [place] [address]  Troponin I (High Sensitivity)     Status: Abnormal   Collection Time: 08/15/22  9:56 AM  Result Value Ref Range   Troponin I (High Sensitivity) 41 (H) <18 ng/L    Comment: (NOTE) Elevated high sensitivity troponin I (hsTnI) values and significant  changes across serial measurements may suggest ACS but many other  chronic and acute conditions are known to elevate hsTnI results.  Refer to the "Links" section for chest pain algorithms and  additional  guidance. Performed at [facility], [address] [place], [place] [address]   CBG monitoring, ED     Status: Abnormal   Collection Time: 08/15/22 11:49 AM  Result Value Ref Range   Glucose-Capillary 212 (  H) 70 - 99 mg/dL    Comment: Glucose reference range applies only to samples taken after fasting for at least 8 hours.  ECHOCARDIOGRAM COMPLETE     Status: None   Collection Time: 08/15/22  2:32 PM  Result Value Ref Range   Weight 3,262.81 oz   Height 66 in   BP 134/65 mmHg   Ao pk vel 2.96 m/s   AV Area VTI 1.29 cm2   AR max vel 1.39 cm2   AV Mean grad 19.0 mmHg   AV Peak grad 35.0 mmHg   S' Lateral 2.69 cm   AV Area mean vel 1.44 cm2   Area-P 1/2 3.68 cm2  CBG monitoring, ED     Status: Abnormal   Collection Time: 08/15/22  4:22 PM  Result Value Ref Range   Glucose-Capillary 224 (H) 70 - 99 mg/dL    Comment: Glucose reference range applies only to samples taken after fasting for at least 8 hours.  CBG monitoring, ED     Status: Abnormal   Collection Time: 08/15/22  9:58 PM  Result Value Ref Range   Glucose-Capillary 203 (H) 70 - 99 mg/dL    Comment: Glucose reference range applies only to samples taken after fasting for at least 8 hours.  Lipid panel     Status: Abnormal   Collection Time: 08/16/22  5:59 AM  Result Value Ref Range   Cholesterol 112 0 - 200 mg/dL   Triglycerides 311 (H) <150 mg/dL   HDL 17 (L) >40 mg/dL   Total CHOL/HDL Ratio 6.6 RATIO   VLDL 62 (H) 0 - 40 mg/dL   LDL Cholesterol 33 0 - 99 mg/dL    Comment:        Total Cholesterol/HDL:CHD Risk Coronary Heart Disease Risk Table                     Men   Women  1/2 Average Risk   3.4   3.3  Average Risk       5.0   4.4  2 X Average Risk   9.6   7.1  3 X Average Risk  23.4   11.0        Use the calculated Patient Ratio above and the CHD Risk Table to determine the patient's CHD Risk.        ATP III CLASSIFICATION (LDL):  <100     mg/dL   Optimal  100-129  mg/dL   Near  or Above                    Optimal  130-159  mg/dL   Borderline  160-189  mg/dL   High  >190     mg/dL   Very High Performed at [facility], [address] [place], [place] [address]   Basic metabolic panel     Status: Abnormal   Collection Time: 08/16/22  5:59 AM  Result Value Ref Range   Sodium 140 135 - 145 mmol/L   Potassium 3.6 3.5 - 5.1 mmol/L   Chloride 113 (H) 98 - 111 mmol/L   CO2 20 (L) 22 - 32 mmol/L   Glucose, Bld 108 (H) 70 - 99 mg/dL    Comment: Glucose reference range applies only to samples taken after fasting for at least 8 hours.   BUN 55 (H) 8 - 23 mg/dL   Creatinine, Ser 2.32 (H) 0.61 - 1.24 mg/dL   Calcium 7.5 (L) 8.9 - 10.3  mg/dL   GFR, Estimated 27 (L) >60 mL/min    Comment: (NOTE) Calculated using the CKD-EPI Creatinine Equation (2021)    Anion gap 7 5 - 15    Comment: Performed at [facility], [address] [place], [place] [address]  CBG monitoring, ED     Status: Abnormal   Collection Time: 08/16/22  8:06 AM  Result Value Ref Range   Glucose-Capillary 125 (H) 70 - 99 mg/dL    Comment: Glucose reference range applies only to samples taken after fasting for at least 8 hours.  CBG monitoring, ED     Status: Abnormal   Collection Time: 08/16/22 11:50 AM  Result Value Ref Range   Glucose-Capillary 186 (H) 70 - 99 mg/dL    Comment: Glucose reference range applies only to samples taken after fasting for at least 8 hours.  Glucose, capillary     Status: Abnormal   Collection Time: 08/16/22  4:41 PM  Result Value Ref Range   Glucose-Capillary 216 (H) 70 - 99 mg/dL    Comment: Glucose reference range applies only to samples taken after fasting for at least 8 hours.  Glucose, capillary     Status: Abnormal   Collection Time: 08/16/22  8:50 PM  Result Value Ref Range   Glucose-Capillary 244 (H) 70 - 99 mg/dL    Comment: Glucose reference range applies only to samples taken after fasting for at least 8 hours.   Comment 1 Notify RN    Glucose, capillary     Status: Abnormal   Collection Time: 08/16/22 10:40 PM  Result Value Ref Range   Glucose-Capillary 228 (H) 70 - 99 mg/dL    Comment: Glucose reference range applies only to samples taken after fasting for at least 8 hours.  Glucose, capillary     Status: Abnormal   Collection Time: 08/17/22  7:37 AM  Result Value Ref Range   Glucose-Capillary 132 (H) 70 - 99 mg/dL    Comment: Glucose reference range applies only to samples taken after fasting for at least 8 hours.  CBC     Status: Abnormal   Collection Time: 08/17/22  8:11 AM  Result Value Ref Range   WBC 8.4 4.0 - 10.5 K/uL   RBC 3.66 (L) 4.22 - 5.81 MIL/uL   Hemoglobin 11.4 (L) 13.0 - 17.0 g/dL   HCT 34.9 (L) 39.0 - 52.0 %   MCV 95.4 80.0 - 100.0 fL   MCH 31.1 26.0 - 34.0 pg   MCHC 32.7 30.0 - 36.0 g/dL   RDW 14.3 11.5 - 15.5 %   Platelets 238 150 - 400 K/uL   nRBC 0.0 0.0 - 0.2 %    Comment: Performed at [facility], [address] [place], [place] [address]  Basic metabolic panel     Status: Abnormal   Collection Time: 08/17/22  8:11 AM  Result Value Ref Range   Sodium 140 135 - 145 mmol/L   Potassium 4.0 3.5 - 5.1 mmol/L   Chloride 110 98 - 111 mmol/L   CO2 22 22 - 32 mmol/L   Glucose, Bld 130 (H) 70 - 99 mg/dL    Comment: Glucose reference range applies only to samples taken after fasting for at least 8 hours.   BUN 55 (H) 8 - 23 mg/dL   Creatinine, Ser 2.33 (H) 0.61 - 1.24 mg/dL   Calcium 8.9 8.9 - 10.3 mg/dL   GFR, Estimated 27 (L) >60 mL/min    Comment: (NOTE) Calculated using  the CKD-EPI Creatinine Equation (2021)    Anion gap 8 5 - 15    Comment: Performed at [facility], [address] [place], [place] [address]  Glucose, capillary     Status: Abnormal   Collection Time: 08/17/22 11:33 AM  Result Value Ref Range   Glucose-Capillary 147 (H) 70 - 99 mg/dL    Comment: Glucose reference range applies only to samples taken after fasting for at least 8 hours.   Glucose, capillary     Status: Abnormal   Collection Time: 08/17/22  3:56 PM  Result Value Ref Range   Glucose-Capillary 134 (H) 70 - 99 mg/dL    Comment: Glucose reference range applies only to samples taken after fasting for at least 8 hours.  POCT glucose (manual entry)     Status: Abnormal   Collection Time: 09/30/22 12:00 AM  Result Value Ref Range   POC Glucose 196 (A) 70 - 99 mg/dl    Radiology: ECHOCARDIOGRAM COMPLETE  Result Date: 08/16/2022    ECHOCARDIOGRAM REPORT   Patient Name:   MIKALE SILVERSMITH Date of Exam: 08/15/2022 Medical Rec #:  [id number]            Height:       66.0 in Accession #:    [id number]           Weight:       203.9 lb Date of Birth:  13-Dec-1940            BSA:          2.016 m Patient Age:    81 years             BP:           134/65 mmHg Patient Gender: M                    HR:           74 bpm. Exam Location:  [facility] Procedure: 2D Echo, Color Doppler and Cardiac Doppler Indications:     R07.9 Chest Pain  History:         Patient has prior history of Echocardiogram examinations, most                  recent 12/29/2019. CHF, PVD; Risk Factors:Hypertension,                  Dyslipidemia and Sleep Apnea.  Sonographer:     Charmayne Sheer Referring Phys:  [id number] [place] TANG Diagnosing Phys: Serafina Royals MD  Sonographer Comments: Suboptimal parasternal window and no subcostal window. IMPRESSIONS  1. Left ventricular ejection fraction, by estimation, is 55 to 60%. The left ventricle has normal function. The left ventricle has no regional wall motion abnormalities. Left ventricular diastolic parameters were normal.  2. Right ventricular systolic function is normal. The right ventricular size is normal.  3. The mitral valve is normal in structure. Mild mitral valve regurgitation.  4. The aortic valve is calcified. Aortic valve regurgitation is mild to moderate. FINDINGS  Left Ventricle: Left ventricular ejection fraction, by estimation, is 55 to 60%. The left ventricle  has normal function. The left ventricle has no regional wall motion abnormalities. The left ventricular internal cavity size was normal in size. There is  no left ventricular hypertrophy. Left ventricular diastolic parameters were normal. Right Ventricle: The right ventricular size is normal. No increase in right ventricular wall thickness. Right ventricular systolic function is normal. Left Atrium: Left  atrial size was normal in size. Right Atrium: Right atrial size was normal in size. Pericardium: There is no evidence of pericardial effusion. Mitral Valve: The mitral valve is normal in structure. Mild mitral valve regurgitation. Tricuspid Valve: The tricuspid valve is normal in structure. Tricuspid valve regurgitation is mild. Aortic Valve: The aortic valve is calcified. Aortic valve regurgitation is mild to moderate. Aortic valve mean gradient measures 19.0 mmHg. Aortic valve peak gradient measures 35.0 mmHg. Aortic valve area, by VTI measures 1.29 cm. Pulmonic Valve: The pulmonic valve was normal in structure. Pulmonic valve regurgitation is trivial. Aorta: The aortic root and ascending aorta are structurally normal, with no evidence of dilitation. IAS/Shunts: No atrial level shunt detected by color flow Doppler.  LEFT VENTRICLE PLAX 2D LVIDd:         3.75 cm   Diastology LVIDs:         2.69 cm   LV e' medial:    5.66 cm/s LV PW:         1.18 cm   LV E/e' medial:  11.3 LV IVS:        0.90 cm   LV e' lateral:   5.22 cm/s LVOT diam:     2.20 cm   LV E/e' lateral: 12.2 LV SV:         82 LV SV Index:   41 LVOT Area:     3.80 cm  LEFT ATRIUM             Index LA diam:        3.90 cm 1.93 cm/m LA Vol (A2C):   51.5 ml 25.54 ml/m LA Vol (A4C):   50.5 ml 25.04 ml/m LA Biplane Vol: 54.7 ml 27.13 ml/m  AORTIC VALVE                     PULMONIC VALVE AV Area (Vmax):    1.39 cm      PV Vmax:       1.17 m/s AV Area (Vmean):   1.44 cm      PV Peak grad:  5.5 mmHg AV Area (VTI):     1.29 cm AV Vmax:           296.00  cm/s AV Vmean:          207.000 cm/s AV VTI:            0.633 m AV Peak Grad:      35.0 mmHg AV Mean Grad:      19.0 mmHg LVOT Vmax:         108.00 cm/s LVOT Vmean:        78.600 cm/s LVOT VTI:          0.215 m LVOT/AV VTI ratio: 0.34  AORTA Ao Root diam: 3.30 cm MITRAL VALVE MV Area (PHT): 3.68 cm    SHUNTS MV Decel Time: 206 msec    Systemic VTI:  0.22 m MV E velocity: 63.80 cm/s  Systemic Diam: 2.20 cm MV A velocity: 99.40 cm/s MV E/A ratio:  0.64 Serafina Royals MD Electronically signed by Serafina Royals MD Signature Date/Time: 08/16/2022/12:39:05 PM    Final    DG Chest Portable 1 View  Result Date: 08/15/2022 CLINICAL DATA:  Chest pain.  No improvement with sublingual nitro. EXAM: PORTABLE CHEST 1 VIEW COMPARISON:  PA Lat 09/22/2020. FINDINGS: The heart size and mediastinal contours are within normal limits with mild aortic atherosclerosis. Both lungs are clear. The visualized skeletal structures are  unremarkable. IMPRESSION: No radiographic evidence of acute chest disease. Electronically Signed   By: Telford Nab M.D.   On: 08/15/2022 04:46    No results found.  No results found.    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[place]) 06/28/2021   Urinary tract infection without hematuria 11/01/2020   Dysuria 11/01/2020   Hyposmolality and/or hyponatremia 09/14/2020   Cough 09/13/2020   Bronchitis 07/15/2020   Palpitations 07/15/2020   Aortic atherosclerosis ([place]) 06/21/2020   Heart failure ([place]) 06/21/2020   Otalgia of both ears 05/27/2020   Lobar pneumonia ([place])    Type 2 diabetes mellitus with stage 4 chronic kidney disease, without long-term current use of insulin ([place]) 12/14/2019   Elevated troponin    Acidosis 10/30/2019   Benign 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 10/30/2019   Hyperkalemia  10/30/2019   Secondary hyperparathyroidism of renal origin ([place]) 10/30/2019   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   Encounter for fitting and adjustment of hearing aid 06/16/2019   Health maintenance alteration 06/16/2019   Prostate cancer ([place]) 06/16/2019   DM type 2 with diabetic peripheral neuropathy ([place]) 01/25/2019   CKD (chronic kidney disease) stage 4, GFR 15-29 ml/min (HCC) 01/21/2019   Gout 01/21/2019   MR (mitral regurgitation) 01/21/2019   Chronic obstructive pulmonary disease ([place]) 01/08/2019   OSA (obstructive sleep apnea) 01/08/2019   Chronic diastolic heart failure ([place]) 12/30/2018   Polyp of sigmoid colon    Benign neoplasm of ascending colon    Hemorrhoids without complication    Diverticulosis of large intestine without diverticulitis    Reflux esophagitis    Lymphangiectasia    Arteriovenous malformation of duodenum    Symptomatic anemia    SOB (shortness of breath)    Iron deficiency anemia due to chronic blood loss    Unstable angina (HCC)    Anemia of chronic renal failure, stage 4 (severe) (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 Chronic asthma, mild intermittent, uncomplicated Continue current inhalers as prescribed - Pulmonary Function Test; Future  2. Restrictive lung  disease Continue current inhalers and will monitor  3. Shortness of breath - Spirometry with graph - Pulmonary Function Test; Future  4. Chronic systolic congestive heart failure ([place]) Followed by cardiology  5. Essential hypertension Stable, continue current medications  6. OSA on CPAP Continue CPAP nightly   General Counseling: I have discussed the findings of the evaluation and examination with Marland Kitchen.  I have also discussed any further diagnostic evaluation thatmay be needed or ordered today. Nyeem verbalizes understanding of the findings of todays visit. We also reviewed his medications today and discussed drug interactions and side effects including but not limited excessive drowsiness and  altered mental states. We also discussed that there is always a risk not just to him but also people around him. he has been encouraged to call the office with any questions or concerns that should arise related to todays visit.  Orders Placed This Encounter  Procedures   Spirometry with graph    Order Specific Question:   Where should this test be performed?    Answer:   [facility]   Pulmonary Function Test    Standing Status:   Future    Standing Expiration Date:   10/30/2023    Order Specific Question:   Where should this test be performed?    Answer:   [facility]    Order Specific Question:   Full PFT: includes the following: basic spirometry, spirometry pre & post bronchodilator, diffusion capacity (DLCO), lung volumes    Answer:   Full PFT     Time spent: 30  I have personally obtained a history, examined the patient, evaluated laboratory and imaging results, formulated the assessment and plan and placed orders. This patient was seen by Drema Dallas, PA-C in collaboration with Dr. Devona Konig as a part of collaborative care agreement.     Allyne Gee, MD [facility] Sleep medicine

## 2022-10-30 DIAGNOSIS — N2581 Secondary hyperparathyroidism of renal origin: Secondary | ICD-10-CM | POA: Diagnosis not present

## 2022-10-30 DIAGNOSIS — I10 Essential (primary) hypertension: Secondary | ICD-10-CM | POA: Diagnosis not present

## 2022-10-30 DIAGNOSIS — N184 Chronic kidney disease, stage 4 (severe): Secondary | ICD-10-CM | POA: Diagnosis not present

## 2022-10-30 DIAGNOSIS — E1122 Type 2 diabetes mellitus with diabetic chronic kidney disease: Secondary | ICD-10-CM | POA: Diagnosis not present

## 2022-11-05 ENCOUNTER — Ambulatory Visit (LOCAL_COMMUNITY_HEALTH_CENTER): Payer: Medicare Other

## 2022-11-05 DIAGNOSIS — Z23 Encounter for immunization: Secondary | ICD-10-CM

## 2022-11-05 DIAGNOSIS — Z7185 Encounter for immunization safety counseling: Secondary | ICD-10-CM

## 2022-11-05 NOTE — Progress Notes (Signed)
  Are you feeling sick today? No   Have you ever received a dose of COVID-19 Vaccine? [employer], [place], [place], [place], Other) Yes  If yes, which vaccine and how many doses?   Pfizer and 3 doses and Moderna one dose   Did you bring the vaccination record card or other documentation?  Yes   Do you have a health condition or are undergoing treatment that makes you moderately or severely immunocompromised? This would include, but not be limited to: cancer, HIV, organ transplant, immunosuppressive therapy/high-dose corticosteroids, or moderate/severe primary immunodeficiency.  No  Have you received COVID-19 vaccine before or during hematopoietic cell transplant (HCT) or CAR-T-cell therapies? No  Have you ever had an allergic reaction to: (This would include a severe allergic reaction or a reaction that caused hives, swelling, or respiratory distress, including wheezing.) A component of a COVID-19 vaccine or a previous dose of COVID-19 vaccine? No   Have you ever had an allergic reaction to another vaccine (other thanCOVID-19 vaccine) or an injectable medication? (This would include a severe allergic reaction or a reaction that caused hives, swelling, or respiratory distress, including wheezing.)   Yes  Reaction to flu vaccine- "huge welts", hives  Allergy to PCN 50 years ago- SOB, rash    Do you have a history of any of the following:  Myocarditis or Pericarditis No  Dermal fillers:  No  Multisystem Inflammatory Syndrome (MIS-C or MIS-A)? No  COVID-19 disease within the past 3 months? No  Vaccinated with monkeypox vaccine in the last 4 weeks? No  Moderna Spikevax 2023-24 (12 yrs +) given and tolerated well. Updated [facility] copy and covid card given. Stayed for 15 min observation without problem. Josie Saunders, RN

## 2022-11-07 NOTE — Patient Instructions (Signed)
Asthma, Adult ? ?Asthma is a long-term (chronic) condition that causes recurrent episodes in which the lower airways in the lungs become tight and narrow. The narrowing is caused by inflammation and tightening of the smooth muscle around the lower airways. ?Asthma episodes, also called asthma attacks or asthma flares, may cause coughing, making high-pitched whistling sounds when you breathe, most often when you breathe out (wheezing), shortness of breath, and chest pain. The airways may produce extra mucus caused by the inflammation and irritation. During an attack, it can be difficult to breathe. Asthma attacks can range from minor to life-threatening. ?Asthma cannot be cured, but medicines and lifestyle changes can help control it and treat acute attacks. It is important to keep your asthma well controlled so the condition does not interfere with your daily life. ?What are the causes? ?This condition is believed to be caused by inherited (genetic) and environmental factors, but its exact cause is not known. ?What can trigger an asthma attack? ?Many things can bring on an asthma attack or make symptoms worse. These triggers are different for every person. Common triggers include: ?Allergens and irritants like mold, dust, pet dander, cockroaches, pollen, air pollution, and chemical odors. ?Cigarette smoke. ?Weather changes and cold air. ?Stress and strong emotional responses such as crying or laughing hard. ?Certain medications such as aspirin or beta blockers. ?Infections and inflammatory conditions, such as the flu, a cold, pneumonia, or inflammation of the nasal membranes (rhinitis). ?Gastroesophageal reflux disease (GERD). ?What are the signs or symptoms? ?Symptoms may occur right after exposure to an asthma trigger or hours later and can vary by person. Common signs and symptoms include: ?Wheezing. ?Trouble breathing (shortness of breath). ?Excessive nighttime or early morning coughing. ?Chest  tightness. ?Tiredness (fatigue) with minimal activity. ?Difficulty talking in complete sentences. ?Poor exercise tolerance. ?How is this diagnosed? ?This condition is diagnosed based on: ?A physical exam and your medical history. ?Tests, which may include: ?Lung function studies to evaluate the flow of air in your lungs. ?Allergy tests. ?Imaging tests, such as X-rays. ?How is this treated? ?There is no cure, but symptoms can be controlled with proper treatment. Treatment usually involves: ?Identifying and avoiding your asthma triggers. ?Inhaled medicines. Two types are commonly used to treat asthma, depending on severity: ?Controller medicines. These help prevent asthma symptoms from occurring. They are taken every day. ?Fast-acting reliever or rescue medicines. These quickly relieve asthma symptoms. They are used as needed and provide short-term relief. ?Using other medicines, such as: ?Allergy medicines, such as antihistamines, if your asthma attacks are triggered by allergens. ?Immune medicines (immunomodulators). These are medicines that help control the immune system. ?Using supplemental oxygen. This is only needed during a severe episode. ?Creating an asthma action plan. An asthma action plan is a written plan for managing and treating your asthma attacks. This plan includes: ?A list of your asthma triggers and how to avoid them. ?Information about when medicines should be taken and when their dosage should be changed. ?Instructions about using a device called a peak flow meter. A peak flow meter measures how well the lungs are working and the severity of your asthma. It helps you monitor your condition. ?Follow these instructions at home: ?Take over-the-counter and prescription medicines only as told by your health care provider. ?Stay up to date on all vaccinations as recommended by your healthcare provider, including vaccines for the flu and pneumonia. ?Use a peak flow meter and keep track of your peak flow  readings. ?Understand and use your asthma   action plan to address any asthma flares. ?Do not smoke or allow anyone to smoke in your home. ?Contact a health care provider if: ?You have wheezing, shortness of breath, or a cough that is not responding to medicines. ?Your medicines are causing side effects, such as a rash, itching, swelling, or trouble breathing. ?You need to use a reliever medicine more than 2-3 times a week. ?Your peak flow reading is still at 50-79% of your personal best after following your action plan for 1 hour. ?You have a fever and shortness of breath. ?Get help right away if: ?You are getting worse and do not respond to treatment during an asthma attack. ?You are short of breath when at rest or when doing very little physical activity. ?You have difficulty eating, drinking, or talking. ?You have chest pain or tightness. ?You develop a fast heartbeat or palpitations. ?You have a bluish color to your lips or fingernails. ?You are light-headed or dizzy, or you faint. ?Your peak flow reading is less than 50% of your personal best. ?You feel too tired to breathe normally. ?These symptoms may be an emergency. Get help right away. Call 911. ?Do not wait to see if the symptoms will go away. ?Do not drive yourself to the hospital. ?Summary ?Asthma is a long-term (chronic) condition that causes recurrent episodes in which the airways become tight and narrow. Asthma episodes, also called asthma attacks or asthma flares, can cause coughing, wheezing, shortness of breath, and chest pain. ?Asthma cannot be cured, but medicines and lifestyle changes can help keep it well controlled and prevent asthma flares. ?Make sure you understand how to avoid triggers and how and when to use your medicines. ?Asthma attacks can range from minor to life-threatening. Get help right away if you have an asthma attack and do not respond to treatment with your usual rescue medicines. ?This information is not intended to replace  advice given to you by your health care provider. Make sure you discuss any questions you have with your health care provider. ?Document Revised: 09/13/2021 Document Reviewed: 09/04/2021 ?Elsevier Patient Education ? 2023 [employer] Inc[employer] ? ?

## 2022-11-22 DIAGNOSIS — D2261 Melanocytic nevi of right upper limb, including shoulder: Secondary | ICD-10-CM | POA: Diagnosis not present

## 2022-11-22 DIAGNOSIS — C44219 Basal cell carcinoma of skin of left ear and external auricular canal: Secondary | ICD-10-CM | POA: Diagnosis not present

## 2022-11-22 DIAGNOSIS — B078 Other viral warts: Secondary | ICD-10-CM | POA: Diagnosis not present

## 2022-11-22 DIAGNOSIS — X32XXXA Exposure to sunlight, initial encounter: Secondary | ICD-10-CM | POA: Diagnosis not present

## 2022-11-22 DIAGNOSIS — D2272 Melanocytic nevi of left lower limb, including hip: Secondary | ICD-10-CM | POA: Diagnosis not present

## 2022-11-22 DIAGNOSIS — L570 Actinic keratosis: Secondary | ICD-10-CM | POA: Diagnosis not present

## 2022-11-22 DIAGNOSIS — R58 Hemorrhage, not elsewhere classified: Secondary | ICD-10-CM | POA: Diagnosis not present

## 2022-11-22 DIAGNOSIS — Z85828 Personal history of other malignant neoplasm of skin: Secondary | ICD-10-CM | POA: Diagnosis not present

## 2022-11-22 DIAGNOSIS — Z85820 Personal history of malignant melanoma of skin: Secondary | ICD-10-CM | POA: Diagnosis not present

## 2022-11-22 DIAGNOSIS — D485 Neoplasm of uncertain behavior of skin: Secondary | ICD-10-CM | POA: Diagnosis not present

## 2022-12-09 DIAGNOSIS — E785 Hyperlipidemia, unspecified: Secondary | ICD-10-CM | POA: Diagnosis not present

## 2022-12-09 DIAGNOSIS — Z8679 Personal history of other diseases of the circulatory system: Secondary | ICD-10-CM | POA: Diagnosis not present

## 2022-12-09 DIAGNOSIS — I5032 Chronic diastolic (congestive) heart failure: Secondary | ICD-10-CM | POA: Diagnosis not present

## 2022-12-09 DIAGNOSIS — I160 Hypertensive urgency: Secondary | ICD-10-CM | POA: Diagnosis not present

## 2022-12-09 DIAGNOSIS — F32A Depression, unspecified: Secondary | ICD-10-CM | POA: Diagnosis not present

## 2022-12-09 DIAGNOSIS — N179 Acute kidney failure, unspecified: Secondary | ICD-10-CM | POA: Diagnosis not present

## 2022-12-09 DIAGNOSIS — I10 Essential (primary) hypertension: Secondary | ICD-10-CM | POA: Diagnosis not present

## 2022-12-09 DIAGNOSIS — I252 Old myocardial infarction: Secondary | ICD-10-CM | POA: Diagnosis not present

## 2022-12-09 DIAGNOSIS — R001 Bradycardia, unspecified: Secondary | ICD-10-CM | POA: Diagnosis not present

## 2022-12-09 DIAGNOSIS — R079 Chest pain, unspecified: Secondary | ICD-10-CM | POA: Diagnosis not present

## 2022-12-09 DIAGNOSIS — M109 Gout, unspecified: Secondary | ICD-10-CM | POA: Diagnosis not present

## 2022-12-09 DIAGNOSIS — K2270 Barrett's esophagus without dysplasia: Secondary | ICD-10-CM | POA: Diagnosis not present

## 2022-12-09 DIAGNOSIS — I2510 Atherosclerotic heart disease of native coronary artery without angina pectoris: Secondary | ICD-10-CM | POA: Diagnosis not present

## 2022-12-09 DIAGNOSIS — R7989 Other specified abnormal findings of blood chemistry: Secondary | ICD-10-CM | POA: Diagnosis not present

## 2022-12-09 DIAGNOSIS — I517 Cardiomegaly: Secondary | ICD-10-CM | POA: Diagnosis not present

## 2022-12-09 DIAGNOSIS — R0789 Other chest pain: Secondary | ICD-10-CM | POA: Diagnosis not present

## 2022-12-09 DIAGNOSIS — N184 Chronic kidney disease, stage 4 (severe): Secondary | ICD-10-CM | POA: Diagnosis not present

## 2022-12-09 DIAGNOSIS — Z955 Presence of coronary angioplasty implant and graft: Secondary | ICD-10-CM | POA: Diagnosis not present

## 2022-12-09 DIAGNOSIS — J449 Chronic obstructive pulmonary disease, unspecified: Secondary | ICD-10-CM | POA: Diagnosis not present

## 2022-12-09 DIAGNOSIS — Z7902 Long term (current) use of antithrombotics/antiplatelets: Secondary | ICD-10-CM | POA: Diagnosis not present

## 2022-12-09 DIAGNOSIS — E1122 Type 2 diabetes mellitus with diabetic chronic kidney disease: Secondary | ICD-10-CM | POA: Diagnosis not present

## 2022-12-09 DIAGNOSIS — Z87891 Personal history of nicotine dependence: Secondary | ICD-10-CM | POA: Diagnosis not present

## 2022-12-09 DIAGNOSIS — Z79899 Other long term (current) drug therapy: Secondary | ICD-10-CM | POA: Diagnosis not present

## 2022-12-09 DIAGNOSIS — I440 Atrioventricular block, first degree: Secondary | ICD-10-CM | POA: Diagnosis not present

## 2022-12-09 DIAGNOSIS — I130 Hypertensive heart and chronic kidney disease with heart failure and stage 1 through stage 4 chronic kidney disease, or unspecified chronic kidney disease: Secondary | ICD-10-CM | POA: Diagnosis not present

## 2022-12-09 DIAGNOSIS — Z794 Long term (current) use of insulin: Secondary | ICD-10-CM | POA: Diagnosis not present

## 2022-12-10 DIAGNOSIS — I160 Hypertensive urgency: Secondary | ICD-10-CM | POA: Diagnosis not present

## 2022-12-10 DIAGNOSIS — Z794 Long term (current) use of insulin: Secondary | ICD-10-CM | POA: Diagnosis not present

## 2022-12-10 DIAGNOSIS — M109 Gout, unspecified: Secondary | ICD-10-CM | POA: Diagnosis not present

## 2022-12-10 DIAGNOSIS — R001 Bradycardia, unspecified: Secondary | ICD-10-CM | POA: Diagnosis not present

## 2022-12-10 DIAGNOSIS — K2270 Barrett's esophagus without dysplasia: Secondary | ICD-10-CM | POA: Diagnosis not present

## 2022-12-10 DIAGNOSIS — I130 Hypertensive heart and chronic kidney disease with heart failure and stage 1 through stage 4 chronic kidney disease, or unspecified chronic kidney disease: Secondary | ICD-10-CM | POA: Diagnosis not present

## 2022-12-10 DIAGNOSIS — E785 Hyperlipidemia, unspecified: Secondary | ICD-10-CM | POA: Diagnosis not present

## 2022-12-10 DIAGNOSIS — I252 Old myocardial infarction: Secondary | ICD-10-CM | POA: Diagnosis not present

## 2022-12-10 DIAGNOSIS — Z87891 Personal history of nicotine dependence: Secondary | ICD-10-CM | POA: Diagnosis not present

## 2022-12-10 DIAGNOSIS — Z79899 Other long term (current) drug therapy: Secondary | ICD-10-CM | POA: Diagnosis not present

## 2022-12-10 DIAGNOSIS — F32A Depression, unspecified: Secondary | ICD-10-CM | POA: Diagnosis not present

## 2022-12-10 DIAGNOSIS — E1122 Type 2 diabetes mellitus with diabetic chronic kidney disease: Secondary | ICD-10-CM | POA: Diagnosis not present

## 2022-12-10 DIAGNOSIS — J449 Chronic obstructive pulmonary disease, unspecified: Secondary | ICD-10-CM | POA: Diagnosis not present

## 2022-12-10 DIAGNOSIS — R079 Chest pain, unspecified: Secondary | ICD-10-CM | POA: Diagnosis not present

## 2022-12-10 DIAGNOSIS — N184 Chronic kidney disease, stage 4 (severe): Secondary | ICD-10-CM | POA: Diagnosis not present

## 2022-12-10 DIAGNOSIS — R0789 Other chest pain: Secondary | ICD-10-CM | POA: Diagnosis not present

## 2022-12-10 DIAGNOSIS — N179 Acute kidney failure, unspecified: Secondary | ICD-10-CM | POA: Diagnosis not present

## 2022-12-10 DIAGNOSIS — I2510 Atherosclerotic heart disease of native coronary artery without angina pectoris: Secondary | ICD-10-CM | POA: Diagnosis not present

## 2022-12-10 DIAGNOSIS — I214 Non-ST elevation (NSTEMI) myocardial infarction: Secondary | ICD-10-CM | POA: Diagnosis not present

## 2022-12-10 DIAGNOSIS — I5032 Chronic diastolic (congestive) heart failure: Secondary | ICD-10-CM | POA: Diagnosis not present

## 2022-12-10 DIAGNOSIS — Z955 Presence of coronary angioplasty implant and graft: Secondary | ICD-10-CM | POA: Diagnosis not present

## 2022-12-10 DIAGNOSIS — Z7902 Long term (current) use of antithrombotics/antiplatelets: Secondary | ICD-10-CM | POA: Diagnosis not present

## 2022-12-10 DIAGNOSIS — I10 Essential (primary) hypertension: Secondary | ICD-10-CM | POA: Diagnosis not present

## 2022-12-10 DIAGNOSIS — I440 Atrioventricular block, first degree: Secondary | ICD-10-CM | POA: Diagnosis not present

## 2022-12-12 ENCOUNTER — Emergency Department
Admission: EM | Admit: 2022-12-12 | Discharge: 2022-12-12 | Disposition: A | Discharge status: Home / Self Care | Payer: Medicare Other | Attending: Emergency Medicine | Admitting: Emergency Medicine

## 2022-12-12 ENCOUNTER — Encounter: Payer: Self-pay | Admitting: Emergency Medicine

## 2022-12-12 ENCOUNTER — Other Ambulatory Visit: Payer: Self-pay

## 2022-12-12 ENCOUNTER — Emergency Department: Payer: Medicare Other

## 2022-12-12 DIAGNOSIS — Z20822 Contact with and (suspected) exposure to covid-19: Secondary | ICD-10-CM | POA: Diagnosis not present

## 2022-12-12 DIAGNOSIS — I959 Hypotension, unspecified: Secondary | ICD-10-CM | POA: Diagnosis not present

## 2022-12-12 DIAGNOSIS — I209 Angina pectoris, unspecified: Secondary | ICD-10-CM | POA: Diagnosis not present

## 2022-12-12 DIAGNOSIS — R0789 Other chest pain: Secondary | ICD-10-CM | POA: Insufficient documentation

## 2022-12-12 DIAGNOSIS — I509 Heart failure, unspecified: Secondary | ICD-10-CM | POA: Diagnosis not present

## 2022-12-12 DIAGNOSIS — I700 Atherosclerosis of aorta: Secondary | ICD-10-CM | POA: Diagnosis not present

## 2022-12-12 DIAGNOSIS — R079 Chest pain, unspecified: Secondary | ICD-10-CM | POA: Diagnosis not present

## 2022-12-12 DIAGNOSIS — R7989 Other specified abnormal findings of blood chemistry: Secondary | ICD-10-CM | POA: Insufficient documentation

## 2022-12-12 DIAGNOSIS — I2510 Atherosclerotic heart disease of native coronary artery without angina pectoris: Secondary | ICD-10-CM | POA: Diagnosis not present

## 2022-12-12 DIAGNOSIS — J449 Chronic obstructive pulmonary disease, unspecified: Secondary | ICD-10-CM | POA: Diagnosis not present

## 2022-12-12 DIAGNOSIS — I10 Essential (primary) hypertension: Secondary | ICD-10-CM | POA: Diagnosis not present

## 2022-12-12 LAB — CBC WITH DIFFERENTIAL/PLATELET
Abs Immature Granulocytes: 0.09 10*3/uL — ABNORMAL HIGH (ref 0.00–0.07)
Basophils Absolute: 0.1 10*3/uL (ref 0.0–0.1)
Basophils Relative: 1 %
Eosinophils Absolute: 0.7 10*3/uL — ABNORMAL HIGH (ref 0.0–0.5)
Eosinophils Relative: 7 %
HCT: 40.1 % (ref 39.0–52.0)
Hemoglobin: 13.4 g/dL (ref 13.0–17.0)
Immature Granulocytes: 1 %
Lymphocytes Relative: 11 %
Lymphs Abs: 1.1 10*3/uL (ref 0.7–4.0)
MCH: 31.5 pg (ref 26.0–34.0)
MCHC: 33.4 g/dL (ref 30.0–36.0)
MCV: 94.4 fL (ref 80.0–100.0)
Monocytes Absolute: 0.9 10*3/uL (ref 0.1–1.0)
Monocytes Relative: 8 %
Neutro Abs: 7.6 10*3/uL (ref 1.7–7.7)
Neutrophils Relative %: 72 %
Platelets: 244 10*3/uL (ref 150–400)
RBC: 4.25 MIL/uL (ref 4.22–5.81)
RDW: 14.6 % (ref 11.5–15.5)
WBC: 10.4 10*3/uL (ref 4.0–10.5)
nRBC: 0 % (ref 0.0–0.2)

## 2022-12-12 LAB — LIPASE, BLOOD: Lipase: 27 U/L (ref 11–51)

## 2022-12-12 LAB — COMPREHENSIVE METABOLIC PANEL
ALT: 13 U/L (ref 0–44)
AST: 18 U/L (ref 15–41)
Albumin: 4.1 g/dL (ref 3.5–5.0)
Alkaline Phosphatase: 49 U/L (ref 38–126)
Anion gap: 11 (ref 5–15)
BUN: 64 mg/dL — ABNORMAL HIGH (ref 8–23)
CO2: 20 mmol/L — ABNORMAL LOW (ref 22–32)
Calcium: 9 mg/dL (ref 8.9–10.3)
Chloride: 98 mmol/L (ref 98–111)
Creatinine, Ser: 2.9 mg/dL — ABNORMAL HIGH (ref 0.61–1.24)
GFR, Estimated: 21 mL/min — ABNORMAL LOW (ref >60)
Glucose, Bld: 128 mg/dL — ABNORMAL HIGH (ref 70–99)
Potassium: 4.5 mmol/L (ref 3.5–5.1)
Sodium: 129 mmol/L — ABNORMAL LOW (ref 135–145)
Total Bilirubin: 0.7 mg/dL (ref 0.3–1.2)
Total Protein: 7.1 g/dL (ref 6.5–8.1)

## 2022-12-12 LAB — RESP PANEL BY RT-PCR (RSV, FLU A&B, COVID)  RVPGX2
Influenza A by PCR: NEGATIVE
Influenza B by PCR: NEGATIVE
Resp Syncytial Virus by PCR: NEGATIVE
SARS Coronavirus 2 by RT PCR: NEGATIVE

## 2022-12-12 LAB — TROPONIN I (HIGH SENSITIVITY)
Troponin I (High Sensitivity): 24 ng/L — ABNORMAL HIGH (ref <18)
Troponin I (High Sensitivity): 26 ng/L — ABNORMAL HIGH (ref <18)

## 2022-12-12 MED ORDER — LIDOCAINE VISCOUS HCL 2 % MT SOLN
15.0000 mL | Freq: Once | OROMUCOSAL | Status: AC
  Administered 2022-12-12: 15 mL via ORAL
  Filled 2022-12-12: qty 15

## 2022-12-12 MED ORDER — ALUM & MAG HYDROXIDE-SIMETH 200-200-20 MG/5ML PO SUSP
30.0000 mL | Freq: Once | ORAL | Status: AC
  Administered 2022-12-12: 30 mL via ORAL
  Filled 2022-12-12: qty 30

## 2022-12-12 NOTE — ED Provider Triage Note (Signed)
Emergency Medicine Provider Triage Evaluation Note  Michel Hendon , a 82 y.o. male  was evaluated in triage.  Pt complains of chest pain.  Seen by Dr. Clayborn Bigness at outside hospital recently and scheduled for appointment this morning.  Review of Systems  Positive: Chest pain Negative: Vomiting  Physical Exam  BP (!) 176/58 (BP Location: Left Arm)   Pulse 62   Temp 97.9 F (36.6 C) (Oral)   Resp 18   Ht '5\' 6"'$  (1.676 m)   Wt 86.2 kg   SpO2 98%   BMI 30.67 kg/m  Gen:   Awake, no distress   Resp:  Normal effort  MSK:   Moves extremities without difficulty  Other:  No truncal vesicles  Medical Decision Making  Medically screening exam initiated at 2:22 AM.  Appropriate orders placed.  [name] was informed that the remainder of the evaluation will be completed by another provider, this initial triage assessment does not replace that evaluation, and the importance of remaining in the ED until their evaluation is complete.  82 year old male presenting with chest pain.  Will obtain cardiac panel and chest x-ray while patient awaits treatment room.   Paulette Blanch, MD 12/12/22 [phone]

## 2022-12-12 NOTE — ED Triage Notes (Signed)
Pt to ED via EMS from home c/o mid chest pain since 1600 today that is pressure and sharp.  States some SOB, productive brown cough with hx COPD.  Pt followed by Dr. Clayborn Bigness and supposed to have appt at 0800 this morning.  Pt A&Ox4, chest rise even and unlabored, skin WNL and in NAD at this time.  Dr. Beather Arbour in triage for MSE.

## 2022-12-12 NOTE — ED Notes (Signed)
Pt to front desk stating that he is supposed to see Dr. Clayborn Bigness at 0800. Pt states that he is going to continue to wait here to be seen.

## 2022-12-12 NOTE — Discharge Instructions (Addendum)
Please seek medical attention for any high fevers, chest pain, shortness of breath, change in behavior, persistent vomiting, bloody stool or any other new or concerning symptoms.  

## 2022-12-12 NOTE — ED Notes (Signed)
Says he has chest pain ysterday and today.  Says he had admit recently in [place].  [facility].  Says the pain is mid chest--pressure and sometines it is sharp.  Alert and oriented, no distress, skin warm and dry.

## 2022-12-12 NOTE — ED Provider Notes (Signed)
[facility] Provider Note    Event Date/Time   First MD Initiated Contact with Patient 12/12/22 [phone]     (approximate)   History   Chest Pain   HPI  Mike Decker is a 82 y.o. male  who presents to the emergency department today with chest pain. Located in his center chest. Described as sharp but also pressure like. The patient was discharged from OSH 2 days ago after an admission for similar symptoms. The patient states he has history of CAD. Did have appointment with cardiology for hospital follow up this morning that he missed.       Physical Exam   Triage Vital Signs: ED Triage Vitals  Enc Vitals Group     BP 12/12/22 0215 (!) 176/58     Pulse Rate 12/12/22 0215 62     Resp 12/12/22 0215 18     Temp 12/12/22 0215 97.9 F (36.6 C)     Temp Source 12/12/22 0215 Oral     SpO2 12/12/22 0215 98 %     Weight 12/12/22 0216 190 lb (86.2 kg)     Height 12/12/22 0216 '5\' 6"'$  (1.676 m)     Head Circumference --      Peak Flow --      Pain Score 12/12/22 0216 7     Pain Loc --      Pain Edu? --      Excl. in [place]? --     Most recent vital signs: Vitals:   12/12/22 0904 12/12/22 0930  BP:  (!) 168/59  Pulse:  64  Resp:  16  Temp: (!) 97.5 F (36.4 C)   SpO2:  96%   General: Awake, alert, oriented. CV:  Good peripheral perfusion. Regular rate and rhythm. Resp:  Normal effort. Lungs clear. Abd:  No distention.    ED Results / Procedures / Treatments   Labs (all labs ordered are listed, but only abnormal results are displayed) Labs Reviewed  CBC WITH DIFFERENTIAL/PLATELET - Abnormal; Notable for the following components:      Result Value   Eosinophils Absolute 0.7 (*)    Abs Immature Granulocytes 0.09 (*)    All other components within normal limits  COMPREHENSIVE METABOLIC PANEL - Abnormal; Notable for the following components:   Sodium 129 (*)    CO2 20 (*)    Glucose, Bld 128 (*)    BUN 64 (*)    Creatinine, Ser 2.90 (*)     GFR, Estimated 21 (*)    All other components within normal limits  TROPONIN I (HIGH SENSITIVITY) - Abnormal; Notable for the following components:   Troponin I (High Sensitivity) 24 (*)    All other components within normal limits  TROPONIN I (HIGH SENSITIVITY) - Abnormal; Notable for the following components:   Troponin I (High Sensitivity) 26 (*)    All other components within normal limits  RESP PANEL BY RT-PCR (RSV, FLU A&B, COVID)  RVPGX2  LIPASE, BLOOD     EKG  I, Nance Pear, attending physician, personally viewed and interpreted this EKG  EKG Time: 0211 Rate: 61 Rhythm: normal sinus rhythm Axis: normal Intervals: qtc 440 QRS: narrow ST changes: no st elevation Impression: normal ekg    RADIOLOGY I independently interpreted and visualized the CXR. My interpretation: No pneumonia.  Radiology interpretation:  IMPRESSION:  No active cardiopulmonary disease.      PROCEDURES:  Critical Care performed: No  Procedures   MEDICATIONS ORDERED IN ED:  Medications  alum & mag hydroxide-simeth (MAALOX/MYLANTA) 200-200-20 MG/5ML suspension 30 mL (30 mLs Oral Given 12/12/22 0938)    And  lidocaine (XYLOCAINE) 2 % viscous mouth solution 15 mL (15 mLs Oral Given 12/12/22 5379)     IMPRESSION / MDM / ASSESSMENT AND PLAN / ED COURSE  I reviewed the triage vital signs and the nursing notes.                              Differential diagnosis includes, but is not limited to, ACS, esophagitis, MSK, pleuritis  Patient's presentation is most consistent with acute presentation with potential threat to life or bodily function.  Patient presented to the emergency department today because of concern for continued chest pain. Patient had recent admission to OSH for the same symptoms. EKG today without concerning ST elevation. Troponin, while slightly elevated, did not have any significant change. Additionally patient has had elevated troponins in the past. Have low suspicion  for dissection or PE at this time. Will plan on having patient follow up with cardiology.      FINAL CLINICAL IMPRESSION(S) / ED DIAGNOSES   Final diagnoses:  Nonspecific chest pain    Note:  This document was prepared using Dragon voice recognition software and may include unintentional dictation errors.    Nance Pear, MD 12/12/22 1005

## 2022-12-12 NOTE — ED Notes (Signed)
Pt took 3 SL nitro at home and '325mg'$  ASA prior to EMS arrival.

## 2022-12-13 ENCOUNTER — Encounter: Payer: Self-pay | Admitting: Internal Medicine

## 2022-12-13 ENCOUNTER — Encounter
Admission: RE | Disposition: A | Discharge status: Home / Self Care | Payer: Self-pay | Source: Ambulatory Visit | Attending: Internal Medicine

## 2022-12-13 ENCOUNTER — Ambulatory Visit
Admission: RE | Admit: 2022-12-13 | Discharge: 2022-12-13 | Disposition: A | Discharge status: Home / Self Care | Payer: Medicare Other | Source: Ambulatory Visit | Attending: Internal Medicine | Admitting: Internal Medicine

## 2022-12-13 ENCOUNTER — Other Ambulatory Visit: Payer: Self-pay

## 2022-12-13 DIAGNOSIS — I25110 Atherosclerotic heart disease of native coronary artery with unstable angina pectoris: Secondary | ICD-10-CM | POA: Diagnosis not present

## 2022-12-13 DIAGNOSIS — E119 Type 2 diabetes mellitus without complications: Secondary | ICD-10-CM | POA: Diagnosis not present

## 2022-12-13 DIAGNOSIS — I10 Essential (primary) hypertension: Secondary | ICD-10-CM | POA: Diagnosis not present

## 2022-12-13 DIAGNOSIS — R079 Chest pain, unspecified: Secondary | ICD-10-CM

## 2022-12-13 DIAGNOSIS — R0789 Other chest pain: Secondary | ICD-10-CM | POA: Diagnosis not present

## 2022-12-13 DIAGNOSIS — Z955 Presence of coronary angioplasty implant and graft: Secondary | ICD-10-CM | POA: Diagnosis not present

## 2022-12-13 HISTORY — PX: LEFT HEART CATH AND CORONARY ANGIOGRAPHY: CATH118249

## 2022-12-13 LAB — GLUCOSE, CAPILLARY: Glucose-Capillary: 77 mg/dL (ref 70–99)

## 2022-12-13 SURGERY — LEFT HEART CATH AND CORONARY ANGIOGRAPHY
Anesthesia: Moderate Sedation | Laterality: Left

## 2022-12-13 MED ORDER — SODIUM CHLORIDE 0.9 % IV SOLN: 250.0000 mL | INTRAVENOUS | Status: DC | PRN

## 2022-12-13 MED ORDER — ACETAMINOPHEN 325 MG PO TABS: 650.0000 mg | ORAL_TABLET | ORAL | Status: DC | PRN

## 2022-12-13 MED ORDER — SODIUM CHLORIDE 0.9% FLUSH: 3.0000 mL | Freq: Two times a day (BID) | INTRAVENOUS | Status: DC

## 2022-12-13 MED ORDER — IOHEXOL 300 MG/ML  SOLN
INTRAMUSCULAR | Status: DC | PRN
  Administered 2022-12-13: 64 mL

## 2022-12-13 MED ORDER — HYDRALAZINE HCL 20 MG/ML IJ SOLN: 10.0000 mg | INTRAMUSCULAR | Status: DC | PRN

## 2022-12-13 MED ORDER — MIDAZOLAM HCL 2 MG/2ML IJ SOLN
INTRAMUSCULAR | Status: DC | PRN
  Administered 2022-12-13: 1 mg via INTRAVENOUS

## 2022-12-13 MED ORDER — MIDAZOLAM HCL 2 MG/2ML IJ SOLN
INTRAMUSCULAR | Status: AC
  Filled 2022-12-13: qty 2

## 2022-12-13 MED ORDER — SODIUM CHLORIDE 0.9% FLUSH: 3.0000 mL | INTRAVENOUS | Status: DC | PRN

## 2022-12-13 MED ORDER — ALUM & MAG HYDROXIDE-SIMETH 200-200-20 MG/5ML PO SUSP: 15.0000 mL | Freq: Once | ORAL | Status: AC

## 2022-12-13 MED ORDER — HEPARIN SODIUM (PORCINE) 1000 UNIT/ML IJ SOLN
INTRAMUSCULAR | Status: DC | PRN
  Administered 2022-12-13: 4500 [IU] via INTRAVENOUS

## 2022-12-13 MED ORDER — VERAPAMIL HCL 2.5 MG/ML IV SOLN
INTRAVENOUS | Status: AC
  Filled 2022-12-13: qty 2

## 2022-12-13 MED ORDER — ONDANSETRON HCL 4 MG/2ML IJ SOLN: 4.0000 mg | Freq: Four times a day (QID) | INTRAMUSCULAR | Status: DC | PRN

## 2022-12-13 MED ORDER — SODIUM CHLORIDE 0.9 % IV SOLN: INTRAVENOUS | Status: DC

## 2022-12-13 MED ORDER — LABETALOL HCL 5 MG/ML IV SOLN: 10.0000 mg | INTRAVENOUS | Status: DC | PRN

## 2022-12-13 MED ORDER — HEPARIN SODIUM (PORCINE) 1000 UNIT/ML IJ SOLN
INTRAMUSCULAR | Status: AC
  Filled 2022-12-13: qty 10

## 2022-12-13 MED ORDER — ALUM & MAG HYDROXIDE-SIMETH 200-200-20 MG/5ML PO SUSP
ORAL | Status: AC
  Administered 2022-12-13: 15 mL via ORAL
  Filled 2022-12-13: qty 30

## 2022-12-13 MED ORDER — VERAPAMIL HCL 2.5 MG/ML IV SOLN
INTRAVENOUS | Status: DC | PRN
  Administered 2022-12-13: 2.5 mg via INTRA_ARTERIAL

## 2022-12-13 MED ORDER — FENTANYL CITRATE (PF) 100 MCG/2ML IJ SOLN
INTRAMUSCULAR | Status: DC | PRN
  Administered 2022-12-13: 25 ug via INTRAVENOUS

## 2022-12-13 MED ORDER — HEPARIN (PORCINE) IN NACL 1000-0.9 UT/500ML-% IV SOLN
INTRAVENOUS | Status: AC
  Filled 2022-12-13: qty 1000

## 2022-12-13 MED ORDER — SODIUM CHLORIDE 0.9 % WEIGHT BASED INFUSION: 3.0000 mL/kg/h | INTRAVENOUS | Status: AC

## 2022-12-13 MED ORDER — SODIUM CHLORIDE 0.9 % IV BOLUS
500.0000 mL | Freq: Once | INTRAVENOUS | Status: AC
  Administered 2022-12-13: 500 mL via INTRAVENOUS

## 2022-12-13 MED ORDER — SODIUM CHLORIDE 0.9 % WEIGHT BASED INFUSION: 1.0000 mL/kg/h | INTRAVENOUS | Status: DC

## 2022-12-13 MED ORDER — FENTANYL CITRATE (PF) 100 MCG/2ML IJ SOLN
INTRAMUSCULAR | Status: AC
  Filled 2022-12-13: qty 2

## 2022-12-13 MED ORDER — HEPARIN (PORCINE) IN NACL 1000-0.9 UT/500ML-% IV SOLN
INTRAVENOUS | Status: DC | PRN
  Administered 2022-12-13 (×2): 500 mL

## 2022-12-13 SURGICAL SUPPLY — 11 items
BAND ZEPHYR COMPRESS 30 LONG (HEMOSTASIS) IMPLANT
CATH INFINITI 5 FR JL3.5 (CATHETERS) IMPLANT
CATH INFINITI JR4 5F (CATHETERS) IMPLANT
DRAPE BRACHIAL (DRAPES) IMPLANT
GLIDESHEATH SLEND SS 6F .021 (SHEATH) IMPLANT
GUIDEWIRE INQWIRE 1.5J.035X260 (WIRE) IMPLANT
INQWIRE 1.5J .035X260CM (WIRE) ×1
PACK CARDIAC CATH (CUSTOM PROCEDURE TRAY) ×1 IMPLANT
PROTECTION STATION PRESSURIZED (MISCELLANEOUS) ×1
SET ATX SIMPLICITY (MISCELLANEOUS) IMPLANT
STATION PROTECTION PRESSURIZED (MISCELLANEOUS) IMPLANT

## 2022-12-13 NOTE — Progress Notes (Signed)
Dr. Clayborn Bigness called and updated pt and family of results from procedure. MD aware of pt's chest pain and spoke to pt and family regarding such. See MAR .

## 2022-12-17 ENCOUNTER — Telehealth: Payer: Self-pay

## 2022-12-17 ENCOUNTER — Encounter: Payer: Self-pay | Admitting: Internal Medicine

## 2022-12-17 NOTE — Telephone Encounter (Signed)
     Patient  visit on 12/12/2022  at [facility] was for chest pain.  Have you been able to follow up with your primary care physician? Yes  The patient was or was not able to obtain any needed medicine or equipment. No medications prescribed.  Are there diet recommendations that you are having difficulty following? Patient is on cardiac diet with no issues.  Patient expresses understanding of discharge instructions and education provided has no other needs at this time.    [place] Resource Care Guide   [email or web address]  ?? [phone]   Website: [email or web address]  [email or web address]

## 2022-12-19 DIAGNOSIS — E1159 Type 2 diabetes mellitus with other circulatory complications: Secondary | ICD-10-CM | POA: Diagnosis not present

## 2022-12-20 ENCOUNTER — Telehealth: Payer: Self-pay

## 2022-12-20 ENCOUNTER — Encounter: Payer: Self-pay | Admitting: Gastroenterology

## 2022-12-20 ENCOUNTER — Ambulatory Visit (INDEPENDENT_AMBULATORY_CARE_PROVIDER_SITE_OTHER): Payer: Medicare Other | Admitting: Gastroenterology

## 2022-12-20 VITALS — BP 156/62 | HR 62 | Temp 97.7°F | Wt 198.0 lb

## 2022-12-20 DIAGNOSIS — R1310 Dysphagia, unspecified: Secondary | ICD-10-CM

## 2022-12-20 NOTE — Telephone Encounter (Signed)
Clearance faxed to Dr [facility] cardiology for procedure/blood thinner clearance

## 2022-12-20 NOTE — Progress Notes (Signed)
Gastroenterology Consultation  Referring Provider:     Cletis Athens, MD Primary Care Physician:  Cletis Athens, MD Primary Gastroenterologist:  Dr. Allen Norris     Reason for Consultation:     Dysphagia        HPI:   Mike Decker is a 82 y.o. y/o male referred for consultation & management of Dysphagia by Dr. Cletis Athens, MD.  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  The patient was followed up with complete removal of the Barrett's esophagus.  At his last visit with the endoscopist there he was told that he did not need any further follow-up.  The patient has been doing well except that he now has some dysphagia.  Since she is having dysphagia he came to see me today.  He states that the dysphagia is more solid for this to liquids.  He has not had any significant weight loss.  The patient was found to have an STEMI in January was followed by cardiology with a hernia. Due to those findings and because of his intermittent chest pain he was recommended to come to see me today.  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[place])    Sleep apnea    Vitreoretinal degeneration     Past Surgical History:  Procedure Laterality Date   CATARACT EXTRACTION  2011, 2012   COLONOSCOPY N/A 12/14/2018   Procedure: COLONOSCOPY;  Surgeon: Virgel Manifold, MD;  Location: [facility] ENDOSCOPY;  Service: Endoscopy;  Laterality: N/A;    CORONARY ANGIOPLASTY WITH STENT PLACEMENT  12/10/2010   CORONARY STENT INTERVENTION N/A 12/29/2019   Procedure: CORONARY STENT INTERVENTION;  Surgeon: Nelva Bush, MD;  Location: [place] CV LAB;  Service: Cardiovascular;  Laterality: N/A;   ESOPHAGOGASTRODUODENOSCOPY N/A 12/14/2018   Procedure: ESOPHAGOGASTRODUODENOSCOPY (EGD);  Surgeon: Virgel Manifold, MD;  Location: [facility] ENDOSCOPY;  Service: Endoscopy;  Laterality: N/A;   ESOPHAGOGASTRODUODENOSCOPY (EGD) WITH PROPOFOL N/A 06/01/2016   Procedure: ESOPHAGOGASTRODUODENOSCOPY (EGD) WITH PROPOFOL;  Surgeon: Manya Silvas, MD;  Location: [facility] ENDOSCOPY;  Service: Endoscopy;  Laterality: N/A;   FEMORAL ARTERY STENT Right    HERNIA REPAIR     x2   INTRAOCULAR LENS INSERTION     LEFT HEART CATH AND CORONARY ANGIOGRAPHY N/A 05/09/2017   Procedure: Left Heart Cath and Coronary Angiography;  Surgeon: Yolonda Kida, MD;  Location: [place] [facility];  Service: Cardiovascular;  Laterality: N/A;   LEFT HEART CATH AND CORONARY ANGIOGRAPHY N/A 12/08/2018   Procedure: LEFT HEART CATH AND CORONARY ANGIOGRAPHY and possible PCI and stent;  Surgeon: Yolonda Kida, MD;  Location: [place] [facility];  Service: Cardiovascular;  Laterality: N/A;   LEFT HEART CATH AND CORONARY ANGIOGRAPHY N/A 12/29/2019   Procedure: LEFT HEART CATH AND CORONARY  ANGIOGRAPHY;  Surgeon: Nelva Bush, MD;  Location: [place] [facility];  Service: Cardiovascular;  Laterality: N/A;   LEFT HEART CATH AND CORONARY ANGIOGRAPHY Left 12/13/2022   Procedure: LEFT HEART CATH AND CORONARY ANGIOGRAPHY;  Surgeon: Yolonda Kida, MD;  Location: [place] [facility];  Service: Cardiovascular;  Laterality: Left;   NOSE SURGERY     submucous resection   PROSTATE SURGERY  12/10/2000   ROTATOR CUFF REPAIR Right     Prior to Admission medications   Medication Sig Start Date End Date Taking? Authorizing Provider  acetaminophen (TYLENOL) 500 MG tablet Take 1,000  mg by mouth every 8 (eight) hours as needed for mild pain.   Yes [provider]  allopurinol (ZYLOPRIM) 100 MG tablet Take 100 mg by mouth daily.   Yes [provider]  amLODipine (NORVASC) 5 MG tablet Take 5 mg by mouth daily.   Yes [provider]  aspirin EC 81 MG tablet Take 81 mg by mouth daily. 04/26/22  Yes [provider]  azelastine (ASTELIN) 0.1 % nasal spray USE 1 TO 2 SPRAYS IN EACH NOSTRIL TWICE A DAY 06/01/20  Yes [provider]  calcitRIOL (ROCALTROL) 0.25 MCG capsule Take 0.25 mcg by mouth daily. 01/10/22 04/12/23 Yes [provider]  cetirizine (ZYRTEC) 5 MG tablet Take 5 mg by mouth daily as needed for allergies or rhinitis.    Yes [provider]  citalopram (CELEXA) 10 MG tablet Take 1 tablet (10 mg total) by mouth daily. 03/12/22  Yes Masoud, Viann Shove, MD  clopidogrel (PLAVIX) 75 MG tablet TAKE 1 TABLET BY MOUTH EVERY DAY 07/02/22  Yes Masoud, Viann Shove, MD  colchicine 0.6 MG tablet Take 3 tablets (1.8 mg total) by mouth daily as needed (gout flares). 12/30/19  Yes Enzo Bi, MD  [employer] VITAMIN B12 1000 MCG tablet TAKE 1 TABLET BY MOUTH EVERY DAY 11/21/21  Yes Earlie Server, MD  Cyanocobalamin 1000 MCG SUBL Take 1,000 mcg by mouth daily.   Yes [provider]  EPINEPHrine (EPI-PEN) 0.3 mg/0.3 mL SOAJ injection Inject into the muscle as needed (anaphylaxis).    Yes [provider]  ferrous sulfate 325 (65 FE) MG EC tablet TAKE 1 TABLET BY MOUTH EVERY DAY 02/06/21  Yes Earlie Server, MD  fluticasone [facility]) 50 MCG/ACT nasal spray USE 1 SPRAY IN EACH NOSTRIL EVERY DAY 09/12/20  Yes Masoud, Viann Shove, MD  furosemide (LASIX) 40 MG tablet Take 40 mg by mouth daily.   Yes [provider]  hydrALAZINE (APRESOLINE) 50 MG tablet Take 50 mg by mouth 3 (three) times daily. 09/12/20  Yes [provider]  insulin aspart (NOVOLOG) 100 UNIT/ML FlexPen 10 UNITS IN AM, 30 UNITS AT LUNCH, 30 UNITS AT [facility] 04/25/20  Yes [provider]  insulin glargine (LANTUS) 100 UNIT/ML injection Inject 0.35 mLs (35 Units total) into the skin at bedtime. This is a decrease from your previous 45 units nightly. Patient taking differently: Inject 56 Units into the skin at bedtime. 12/30/19  Yes Enzo Bi, MD  isosorbide mononitrate (IMDUR) 120 MG 24 hr tablet Take 240 mg by mouth daily. 12/16/20  Yes [provider]  JARDIANCE 10 MG TABS tablet Take 10 mg by mouth daily. 02/19/22  Yes [provider]  magnesium oxide (MAG-OX) 400 (240 Mg) MG tablet Take 1 tablet by mouth daily. 04/25/22  Yes [provider]  methocarbamol (ROBAXIN) 500 MG tablet Take 500 mg by mouth at bedtime. 11/08/18  Yes [provider]  metoprolol tartrate (LOPRESSOR) 50 MG tablet Take 50 mg by mouth 2 (two) times daily.   Yes [provider]  mometasone [facility]) 220 MCG/ACT inhaler Inhale 2 puffs into the lungs daily. 05/30/22  Yes [provider]  montelukast (SINGULAIR) 10 MG tablet TAKE 1 TABLET BY MOUTH EVERYDAY AT BEDTIME 07/18/22  Yes McDonough, Lauren K, PA-C  nitroGLYCERIN (NITROSTAT) 0.4 MG SL tablet Place 0.4 mg under the tongue every 5 (five) minutes x 3 doses as needed for chest pain.    Yes [provider]  pantoprazole (PROTONIX) 40 MG tablet Take 1 tablet (40 mg total) by mouth 2 (two) times daily before a meal. 12/17/19  Yes Amin, Ankit Chirag, MD  ranolazine (RANEXA) 500 MG 12 hr tablet Take 1,000 mg by mouth 2 (two) times daily. 06/06/22  Yes [provider]  rosuvastatin (CRESTOR) 40 MG tablet Take 40 mg by mouth daily.   Yes [provider]  sodium bicarbonate 650 MG tablet Take 650 mg by mouth 2 (two) times daily.   Yes [provider]  sodium fluoride (DENTA 5000 PLUS) 1.1 % CREA dental cream See admin instructions. 01/03/22  Yes [provider]  Tiotropium Bromide-Olodaterol (STIOLTO RESPIMAT) 2.5-2.5 MCG/ACT AERS Inhale 1 each into the lungs daily.    Yes [provider]  torsemide 40 MG TABS Take 40 mg by mouth 3 (three) times a week. Take once on MWF 08/20/22  Yes Wyvonnia Dusky, MD  Vitamin D, Ergocalciferol, 50000 units CAPS Take 1 capsule by mouth every 30 (thirty) days.   Yes [provider]  metoprolol succinate (TOPROL-XL) 50 MG 24 hr tablet Take 1 tablet (50 mg total) by mouth daily. Take with or immediately following a meal. Patient not taking: Reported on 12/13/2022 08/18/22 12/13/22  Wyvonnia Dusky, MD  rosuvastatin (CRESTOR) 10 MG tablet Take 1 tablet (10 mg total) by mouth at bedtime. Patient taking differently: Take 40 mg by mouth at bedtime. 08/17/22 09/16/22  Wyvonnia Dusky, MD    Family History  Problem Relation Age of Onset   Arthritis Mother    Stroke Maternal Grandfather    Breast cancer Neg Hx      Social History   Tobacco Use   Smoking status: Former    Years: 20.00    Types: Cigarettes    Quit date: 12/10/1976    Years since quitting: 46.0   Smokeless tobacco: Never  Substance Use Topics   Alcohol use: No   Drug use: No    Allergies as of 12/20/2022 - Review Complete 12/20/2022  Allergen Reaction Noted   Gabapentin  08/29/2017   Peanut-containing drug products Anaphylaxis 03/30/2014   Penicillins Hives and Rash 03/30/2014   Bee venom Swelling 03/30/2014   Influenza vaccines Hives 03/30/2014   Inh [isoniazid] Hives 03/30/2014   Jardiance [empagliflozin] Other (See Comments) 09/15/2021   Kenalog [triamcinolone acetonide] Hives 03/30/2014   Levaquin [levofloxacin] Other (See Comments) 10/20/2018   Lisinopril  08/11/2019   Nalfon [fenoprofen calcium] Hives 03/30/2014   Naproxen  02/27/2018   Peanut oil  02/27/2018   Nsaids Rash     Review of Systems:    All systems reviewed and negative except where noted in HPI.   Physical Exam:  BP (!) 156/62 (BP Location: Right Arm, Patient Position: Sitting, Cuff Size: Normal)   Pulse 62   Temp 97.7 F (36.5 C) (Oral)   Wt 198 lb  (89.8 kg)   BMI 31.96 kg/m  No LMP for male  patient. General:   Alert,  Well-developed, well-nourished, pleasant and cooperative in NAD Head:  Normocephalic and atraumatic. Eyes:  Sclera clear, no icterus.   Conjunctiva pink. Ears:  Normal auditory acuity. Neck:  Supple; no masses or thyromegaly. Neurologic:  Alert and oriented x3;  grossly normal neurologically. Skin:  Intact without significant lesions or rashes.  No jaundice. Lymph Nodes:  No significant cervical adenopathy. Psych:  Alert and cooperative. Normal mood and affect.  Imaging Studies: CARDIAC CATHETERIZATION  Result Date: 12/15/2022   Prox LAD to Mid LAD lesion is 50% stenosed.   Dist LAD lesion is 75% stenosed.   Dist RCA lesion is 40% stenosed.   Prox RCA to Mid RCA lesion is 40% stenosed.   Ost 1st Mrg to 1st Mrg lesion is 50% stenosed.   Non-stenotic Prox Cx lesion was previously treated.   Non-stenotic Prox RCA lesion was previously treated.   The left ventricular systolic function is normal.   LV end diastolic pressure is normal.   The left ventricular ejection fraction is 55-65% by visual estimate. Conclusion Left heart cath right radial approach because of unstable anginal symptoms known coronary disease Left ventriculogram preserved left ventricular function EF around 55 to 60% mild left ventricular enlargement Coronaries Left main large minor irregularities LAD large with diffuse minor irregularities and a 50% mid lesion distal 50-75 Circumflex medium to large minor irregularities widely patent proximal stent OM1 diffusely diseased 50-75 small vessel RCA large proximal stent widely patent 50% mid lesion mixed dominant All vessels with TIMI-3 flow Intervention deferred not indicated   DG Chest 2 View  Result Date: 12/12/2022 CLINICAL DATA:  Mid chest pain. EXAM: CHEST - 2 VIEW COMPARISON:  August 15, 2022 FINDINGS: The cardiac silhouette is mildly enlarged and unchanged in size. Both lungs are clear. The visualized  skeletal structures are unremarkable. IMPRESSION: No active cardiopulmonary disease. Electronically Signed   By: Virgina Norfolk M.D.   On: 12/12/2022 02:32    Assessment and Plan:   Mike Decker is a 82 y.o. 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  The patient was followed up and told that he did not need any further surveillance of his Barrett's esophagus due to his age.  The patient comes in with dysphagia and had an NSTEMI in January.  The patient will wait 6 weeks and then undergo a upper endoscopy due to his new dysphagia and his intermittent chest pain.  The patient has been explained the plan and agrees with it    [name], MD. Marval Regal    Note: This dictation was prepared with Dragon dictation along with smaller phrase technology. Any transcriptional errors that result from this process are unintentional.

## 2022-12-24 ENCOUNTER — Inpatient Hospital Stay: Payer: Medicare Other | Attending: Oncology

## 2022-12-24 DIAGNOSIS — Z7982 Long term (current) use of aspirin: Secondary | ICD-10-CM | POA: Insufficient documentation

## 2022-12-24 DIAGNOSIS — E538 Deficiency of other specified B group vitamins: Secondary | ICD-10-CM | POA: Insufficient documentation

## 2022-12-24 DIAGNOSIS — Z7902 Long term (current) use of antithrombotics/antiplatelets: Secondary | ICD-10-CM | POA: Insufficient documentation

## 2022-12-24 DIAGNOSIS — N184 Chronic kidney disease, stage 4 (severe): Secondary | ICD-10-CM | POA: Diagnosis present

## 2022-12-24 DIAGNOSIS — D631 Anemia in chronic kidney disease: Secondary | ICD-10-CM | POA: Insufficient documentation

## 2022-12-24 DIAGNOSIS — K2270 Barrett's esophagus without dysplasia: Secondary | ICD-10-CM | POA: Diagnosis not present

## 2022-12-24 LAB — COMPREHENSIVE METABOLIC PANEL
ALT: 14 U/L (ref 0–44)
AST: 16 U/L (ref 15–41)
Albumin: 3.7 g/dL (ref 3.5–5.0)
Alkaline Phosphatase: 54 U/L (ref 38–126)
Anion gap: 10 (ref 5–15)
BUN: 46 mg/dL — ABNORMAL HIGH (ref 8–23)
CO2: 21 mmol/L — ABNORMAL LOW (ref 22–32)
Calcium: 8.5 mg/dL — ABNORMAL LOW (ref 8.9–10.3)
Chloride: 101 mmol/L (ref 98–111)
Creatinine, Ser: 2.56 mg/dL — ABNORMAL HIGH (ref 0.61–1.24)
GFR, Estimated: 24 mL/min — ABNORMAL LOW (ref >60)
Glucose, Bld: 144 mg/dL — ABNORMAL HIGH (ref 70–99)
Potassium: 4.8 mmol/L (ref 3.5–5.1)
Sodium: 132 mmol/L — ABNORMAL LOW (ref 135–145)
Total Bilirubin: 0.3 mg/dL (ref 0.3–1.2)
Total Protein: 6.5 g/dL (ref 6.5–8.1)

## 2022-12-24 LAB — CBC WITH DIFFERENTIAL/PLATELET
Abs Immature Granulocytes: 0.17 10*3/uL — ABNORMAL HIGH (ref 0.00–0.07)
Basophils Absolute: 0.1 10*3/uL (ref 0.0–0.1)
Basophils Relative: 1 %
Eosinophils Absolute: 0.5 10*3/uL (ref 0.0–0.5)
Eosinophils Relative: 7 %
HCT: 34.6 % — ABNORMAL LOW (ref 39.0–52.0)
Hemoglobin: 11.4 g/dL — ABNORMAL LOW (ref 13.0–17.0)
Immature Granulocytes: 2 %
Lymphocytes Relative: 13 %
Lymphs Abs: 1 10*3/uL (ref 0.7–4.0)
MCH: 31.7 pg (ref 26.0–34.0)
MCHC: 32.9 g/dL (ref 30.0–36.0)
MCV: 96.1 fL (ref 80.0–100.0)
Monocytes Absolute: 0.6 10*3/uL (ref 0.1–1.0)
Monocytes Relative: 8 %
Neutro Abs: 5.2 10*3/uL (ref 1.7–7.7)
Neutrophils Relative %: 69 %
Platelets: 223 10*3/uL (ref 150–400)
RBC: 3.6 MIL/uL — ABNORMAL LOW (ref 4.22–5.81)
RDW: 14.6 % (ref 11.5–15.5)
WBC: 7.6 10*3/uL (ref 4.0–10.5)
nRBC: 0 % (ref 0.0–0.2)

## 2022-12-24 LAB — IRON AND TIBC
Iron: 84 ug/dL (ref 45–182)
Saturation Ratios: 28 % (ref 17.9–39.5)
TIBC: 300 ug/dL (ref 250–450)
UIBC: 216 ug/dL

## 2022-12-24 LAB — FERRITIN: Ferritin: 108 ng/mL (ref 24–336)

## 2022-12-24 LAB — VITAMIN B12: Vitamin B-12: 321 pg/mL (ref 180–914)

## 2022-12-26 ENCOUNTER — Encounter: Payer: Self-pay | Admitting: Oncology

## 2022-12-26 ENCOUNTER — Inpatient Hospital Stay (HOSPITAL_BASED_OUTPATIENT_CLINIC_OR_DEPARTMENT_OTHER): Payer: Medicare Other | Admitting: Oncology

## 2022-12-26 VITALS — BP 123/52 | HR 60 | Temp 97.8°F | Wt 198.5 lb

## 2022-12-26 DIAGNOSIS — D500 Iron deficiency anemia secondary to blood loss (chronic): Secondary | ICD-10-CM | POA: Diagnosis not present

## 2022-12-26 DIAGNOSIS — Z7902 Long term (current) use of antithrombotics/antiplatelets: Secondary | ICD-10-CM | POA: Diagnosis not present

## 2022-12-26 DIAGNOSIS — N184 Chronic kidney disease, stage 4 (severe): Secondary | ICD-10-CM

## 2022-12-26 DIAGNOSIS — E538 Deficiency of other specified B group vitamins: Secondary | ICD-10-CM

## 2022-12-26 DIAGNOSIS — Z7982 Long term (current) use of aspirin: Secondary | ICD-10-CM | POA: Diagnosis not present

## 2022-12-26 DIAGNOSIS — K2270 Barrett's esophagus without dysplasia: Secondary | ICD-10-CM | POA: Diagnosis not present

## 2022-12-26 DIAGNOSIS — K22711 Barrett's esophagus with high grade dysplasia: Secondary | ICD-10-CM | POA: Diagnosis not present

## 2022-12-26 DIAGNOSIS — D631 Anemia in chronic kidney disease: Secondary | ICD-10-CM | POA: Diagnosis not present

## 2022-12-26 NOTE — Progress Notes (Signed)
Hematology/Oncology Follow Up Note [facility]  [phone] [phone] REASON FOR VISIT follow-up for management of iron deficiency anemia, anemia and CKD,   ASSESSMENT & PLAN:   Anemia of chronic renal failure, stage 4 (severe) (HCC) #Anemia in CKD Labs are reviewed and are discussed with patient Hemoglobin has decreased to 11.  Iron panel is stable, ferritin is not at goal. Recommend IV Venofer weekly x 2.  After that recommend patient to continue oral iron supplementation once daily.  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  S/p resection.  Continue follow-up with gastroenterology.  Vitamin B12 deficiency History of Vitamin B12 deficiency, negative intrinsic factor antibody and antiparital antibody.  B12 level is stable.  Recommend patient to continue oral vitamin B12 supplementation 2-3 times per week   Orders Placed This Encounter  Procedures   CBC with Differential/Platelet    Standing Status:   Future    Standing Expiration Date:   12/26/2023   Iron and TIBC([facility])    Standing Status:   Future    Standing Expiration Date:   12/27/2023   Ferritin    Standing Status:   Future    Standing Expiration Date:   12/27/2023   Retic Panel    Standing Status:   Future    Standing Expiration Date:   12/27/2023   Vitamin B12    Standing Status:   Future    Standing Expiration Date:   12/27/2023  Follow-up in 6 months.  All questions were answered. The patient knows to call the clinic with any problems, questions or concerns.  Earlie Server, MD, PhD [facility] Hematology Oncology 12/26/2022     PERTINENT HEMATOLOGY Oncology HISTORY received IV Venofer daily x3 as well as multiple units of PRBC transfusion. GI was consulted and patient had EGD done during his hospitalization.  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  Treated with argon plasma coagulation.  Colonoscopy showed polyps which were  removed. Patient was discharged on 12/15/2018.  # 03/23/2019 mammogram showed gynecomastia.  #History of cutaneous melanoma status post Mohs surgery. 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 95% stenosis in the left circumflex status post drug-eluting stent. Patient is on aspirin and Plavix prior to this cardiac event. 04/18/2020, patient has a ER visit due to chest pain. Troponin was negative. Pain improved with medication. Patient was discharged with outpatient cardiology follow-up.   INTERVAL HISTORY 82 y.o. male with PMH listed as below present to follow-up for anemia of CKD.  Patient reported recent hospitalization 12/09/2022 in [place] due to chest pain/non-STEMI, started on heparin drip.  Seen by cardiology Dr. Clayborn Bigness.  Patient is currently on Plavix/aspirin.  Some easy bruising. Patient reports feeling congested recently.  He follows up with cardiology and was last seen on 12/12/2022.   Review of Systems  Constitutional:  Negative for appetite change, chills, fatigue, fever and unexpected weight change.  HENT:   Negative for hearing loss and voice change.   Eyes:  Negative for eye problems and icterus.  Respiratory:  Positive for shortness of breath. Negative for chest tightness and cough.   Cardiovascular:  Negative for chest pain and leg swelling.  Gastrointestinal:  Negative for abdominal distention and abdominal pain.  Endocrine: Negative for hot flashes.  Genitourinary:  Negative for difficulty urinating, dysuria and frequency.   Musculoskeletal:  Negative for arthralgias.  Skin:  Negative for  itching and rash.  Neurological:  Negative for light-headedness and numbness.  Hematological:  Negative for adenopathy. Bruises/bleeds easily.  Psychiatric/Behavioral:  Negative for confusion.     Allergies  Allergen Reactions   Gabapentin     Other reaction(s):  Other (See Comments) Tremors   Peanut-Containing Drug Products Anaphylaxis   Penicillins Hives and Rash    Has patient had a PCN reaction causing immediate rash, facial/tongue/throat swelling, SOB or lightheadedness with hypotension: Yes Has patient had a PCN reaction causing severe rash involving mucus membranes or skin necrosis: No Has patient had a PCN reaction that required hospitalization No Has patient had a PCN reaction occurring within the last 10 years: No If all of the above answers are "NO", then may proceed with Cephalosporin use.   Bee Venom Swelling   Influenza Vaccines Hives   Inh [Isoniazid] Hives   Jardiance [Empagliflozin] Other (See Comments)    Diarrhea   Kenalog [Triamcinolone Acetonide] Hives   Levaquin [Levofloxacin] Other (See Comments)    Tendon, ligament pain.    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, esphageal   Chronic diastolic CHF (congestive heart failure) (HCC)    Colon polyp    Diabetes mellitus without complication ([place])    Diverticulosis    Gout    Heart disease    Hemangioma    liver   Hyperlipidemia    Hypertension    Myocardial infarct [facility])    Ocular hypertension    Peripheral vascular disease ([place])    Skin cancer    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[place])    Sleep apnea    Vitreoretinal degeneration      Past Surgical History:  Procedure Laterality Date   CATARACT EXTRACTION  2011, 2012   COLONOSCOPY N/A 12/14/2018   Procedure: COLONOSCOPY;  Surgeon: Virgel Manifold, MD;  Location: [facility] ENDOSCOPY;  Service: Endoscopy;  Laterality: N/A;   CORONARY ANGIOPLASTY WITH STENT PLACEMENT  12/10/2010   CORONARY STENT INTERVENTION N/A 12/29/2019    Procedure: CORONARY STENT INTERVENTION;  Surgeon: Nelva Bush, MD;  Location: [place] CV LAB;  Service: Cardiovascular;  Laterality: N/A;   ESOPHAGOGASTRODUODENOSCOPY N/A 12/14/2018   Procedure: ESOPHAGOGASTRODUODENOSCOPY (EGD);  Surgeon: Virgel Manifold, MD;  Location: [facility] ENDOSCOPY;  Service: Endoscopy;  Laterality: N/A;   ESOPHAGOGASTRODUODENOSCOPY (EGD) WITH PROPOFOL N/A 06/01/2016   Procedure: ESOPHAGOGASTRODUODENOSCOPY (EGD) WITH PROPOFOL;  Surgeon: Manya Silvas, MD;  Location: [facility] ENDOSCOPY;  Service: Endoscopy;  Laterality: N/A;   FEMORAL ARTERY STENT Right    HERNIA REPAIR     x2   INTRAOCULAR LENS INSERTION     LEFT HEART CATH AND CORONARY ANGIOGRAPHY N/A 05/09/2017   Procedure: Left Heart Cath and Coronary Angiography;  Surgeon: Yolonda Kida, MD;  Location: [place] CV LAB;  Service: Cardiovascular;  Laterality: N/A;   LEFT HEART CATH AND CORONARY ANGIOGRAPHY N/A 12/08/2018   Procedure: LEFT HEART CATH AND CORONARY ANGIOGRAPHY and possible PCI and stent;  Surgeon: Yolonda Kida, MD;  Location: [place] CV LAB;  Service: Cardiovascular;  Laterality: N/A;   LEFT HEART CATH AND CORONARY  ANGIOGRAPHY N/A 12/29/2019   Procedure: LEFT HEART CATH AND CORONARY ANGIOGRAPHY;  Surgeon: Nelva Bush, MD;  Location: [place] CV LAB;  Service: Cardiovascular;  Laterality: N/A;   LEFT HEART CATH AND CORONARY ANGIOGRAPHY Left 12/13/2022   Procedure: LEFT HEART CATH AND CORONARY ANGIOGRAPHY;  Surgeon: Yolonda Kida, MD;  Location: [place] CV LAB;  Service: Cardiovascular;  Laterality: Left;   NOSE SURGERY     submucous resection   PROSTATE SURGERY  12/10/2000   ROTATOR CUFF REPAIR Right     Social History   Socioeconomic History   Marital status: Married    Spouse name: Not on file   Number of children: 2   Years of education: College 3 years   Highest education level: Associate degree: occupational, Hotel manager, or vocational  program  Occupational History   Occupation: retired  Tobacco Use   Smoking status: Former    Years: 20.00    Types: Cigarettes    Quit date: 12/10/1976    Years since quitting: 46.0   Smokeless tobacco: Never  Substance and Sexual Activity   Alcohol use: No   Drug use: No   Sexual activity: Not Currently  Other Topics Concern   Not on file  Social History Narrative   Not on file   Social Determinants of Health   Financial Resource Strain: Low Risk  (09/15/2021)   Overall Financial Resource Strain (CARDIA)    Difficulty of Paying Living Expenses: Not very hard  Food Insecurity: No Food Insecurity (10/15/2022)   Hunger Vital Sign    Worried About Running Out of Food in the Last Year: Never true    Ran Out of Food in the Last Year: Never true  Transportation Needs: No Transportation Needs (10/15/2022)   PRAPARE - Hydrologist (Medical): No    Lack of Transportation (Non-Medical): No  Physical Activity: Insufficiently Active (09/15/2021)   Exercise Vital Sign    Days of Exercise per Week: 7 days    Minutes of Exercise per Session: 20 min  Stress: No Stress Concern Present (09/15/2021)   [place]    Feeling of Stress : Not at all  Social Connections: Moderately Isolated (09/15/2021)   Social Connection and Isolation Panel [NHANES]    Frequency of Communication with Friends and Family: Once a week    Frequency of Social Gatherings with Friends and Family: Once a week    Attends Religious Services: More than 4 times per year    Active Member of [employer] or Organizations: No    Attends Archivist Meetings: Never    Marital Status: Married  Human resources officer Violence: Not At Risk (08/16/2022)   Humiliation, Afraid, Rape, and Kick questionnaire    Fear of Current or Ex-Partner: No    Emotionally Abused: No    Physically Abused: No    Sexually Abused: No    Family History  Problem  Relation Age of Onset   Arthritis Mother    Stroke Maternal Grandfather    Breast cancer Neg Hx      Current Outpatient Medications:    acetaminophen (TYLENOL) 500 MG tablet, Take 1,000 mg by mouth every 8 (eight) hours as needed for mild pain., Disp: , Rfl:    allopurinol (ZYLOPRIM) 100 MG tablet, Take 100 mg by mouth daily., Disp: , Rfl:    amLODipine (NORVASC) 5 MG tablet, Take 5 mg by mouth daily., Disp: , Rfl:  aspirin EC 81 MG tablet, Take 81 mg by mouth daily., Disp: , Rfl:    azelastine (ASTELIN) 0.1 % nasal spray, USE 1 TO 2 SPRAYS IN EACH NOSTRIL TWICE A DAY, Disp: , Rfl:    calcitRIOL (ROCALTROL) 0.25 MCG capsule, Take 0.25 mcg by mouth daily., Disp: , Rfl:    cetirizine (ZYRTEC) 5 MG tablet, Take 5 mg by mouth daily as needed for allergies or rhinitis. , Disp: , Rfl:    citalopram (CELEXA) 10 MG tablet, Take 1 tablet (10 mg total) by mouth daily., Disp: 90 tablet, Rfl: 3   clopidogrel (PLAVIX) 75 MG tablet, TAKE 1 TABLET BY MOUTH EVERY DAY, Disp: 90 tablet, Rfl: 1   colchicine 0.6 MG tablet, Take 3 tablets (1.8 mg total) by mouth daily as needed (gout flares)., Disp: , Rfl:    [employer] VITAMIN B12 1000 MCG tablet, TAKE 1 TABLET BY MOUTH EVERY DAY, Disp: 30 tablet, Rfl: 0   Cyanocobalamin 1000 MCG SUBL, Take 1,000 mcg by mouth daily., Disp: , Rfl:    EPINEPHrine (EPI-PEN) 0.3 mg/0.3 mL SOAJ injection, Inject into the muscle as needed (anaphylaxis). , Disp: , Rfl:    ferrous sulfate 325 (65 FE) MG EC tablet, TAKE 1 TABLET BY MOUTH EVERY DAY, Disp: 90 tablet, Rfl: 3   fluticasone (FLONASE) 50 MCG/ACT nasal spray, USE 1 SPRAY IN EACH NOSTRIL EVERY DAY, Disp: 32 g, Rfl: 6   furosemide (LASIX) 40 MG tablet, Take 40 mg by mouth daily., Disp: , Rfl:    hydrALAZINE (APRESOLINE) 50 MG tablet, Take 50 mg by mouth 3 (three) times daily., Disp: , Rfl:    insulin aspart (NOVOLOG) 100 UNIT/ML FlexPen, 10 UNITS IN AM, 30 UNITS AT LUNCH, 30 UNITS AT DINNER, Disp: , Rfl:    insulin glargine (LANTUS)  100 UNIT/ML injection, Inject 0.35 mLs (35 Units total) into the skin at bedtime. This is a decrease from your previous 45 units nightly. (Patient taking differently: Inject 56 Units into the skin at bedtime.), Disp: 10 mL, Rfl: 11   isosorbide mononitrate (IMDUR) 120 MG 24 hr tablet, Take 240 mg by mouth daily., Disp: , Rfl:    JARDIANCE 10 MG TABS tablet, Take 10 mg by mouth daily., Disp: , Rfl:    magnesium oxide (MAG-OX) 400 (240 Mg) MG tablet, Take 1 tablet by mouth daily., Disp: , Rfl:    methocarbamol (ROBAXIN) 500 MG tablet, Take 500 mg by mouth at bedtime., Disp: , Rfl: 0   metoprolol tartrate (LOPRESSOR) 50 MG tablet, Take 50 mg by mouth 2 (two) times daily., Disp: , Rfl:    mometasone (ASMANEX) 220 MCG/ACT inhaler, Inhale 2 puffs into the lungs daily., Disp: , Rfl:    montelukast (SINGULAIR) 10 MG tablet, TAKE 1 TABLET BY MOUTH EVERYDAY AT BEDTIME, Disp: 90 tablet, Rfl: 1   nitroGLYCERIN (NITROSTAT) 0.4 MG SL tablet, Place 0.4 mg under the tongue every 5 (five) minutes x 3 doses as needed for chest pain. , Disp: , Rfl:    pantoprazole (PROTONIX) 40 MG tablet, Take 1 tablet (40 mg total) by mouth 2 (two) times daily before a meal., Disp: , Rfl:    ranolazine (RANEXA) 500 MG 12 hr tablet, Take 1,000 mg by mouth 2 (two) times daily., Disp: , Rfl:    rosuvastatin (CRESTOR) 10 MG tablet, Take 1 tablet (10 mg total) by mouth at bedtime. (Patient taking differently: Take 40 mg by mouth at bedtime.), Disp: 30 tablet, Rfl: 0   rosuvastatin (CRESTOR) 40 MG  tablet, Take 40 mg by mouth daily., Disp: , Rfl:    sodium bicarbonate 650 MG tablet, Take 650 mg by mouth 2 (two) times daily., Disp: , Rfl:    sodium fluoride (DENTA 5000 PLUS) 1.1 % CREA dental cream, See admin instructions., Disp: , Rfl:    Tiotropium Bromide-Olodaterol (STIOLTO RESPIMAT) 2.5-2.5 MCG/ACT AERS, Inhale 1 each into the lungs daily., Disp: , Rfl:    metoprolol succinate (TOPROL-XL) 50 MG 24 hr tablet, Take 1 tablet (50 mg total)  by mouth daily. Take with or immediately following a meal. (Patient not taking: Reported on 12/26/2022), Disp: 30 tablet, Rfl: 0   torsemide 40 MG TABS, Take 40 mg by mouth 3 (three) times a week. Take once on MWF, Disp: , Rfl:    Vitamin D, Ergocalciferol, 50000 units CAPS, Take 1 capsule by mouth every 30 (thirty) days., Disp: , Rfl:   Physical exam:  Vitals:   12/26/22 1317  BP: (!) 123/52  Pulse: 60  Temp: 97.8 F (36.6 C)  TempSrc: Tympanic  SpO2: 98%  Weight: 198 lb 8 oz (90 kg)   Physical Exam Constitutional:      General: He is not in acute distress.    Appearance: He is obese.  HENT:     Head: Normocephalic and atraumatic.  Eyes:     General: No scleral icterus.    Pupils: Pupils are equal, round, and reactive to light.  Cardiovascular:     Rate and Rhythm: Normal rate and regular rhythm.     Heart sounds: Normal heart sounds.  Pulmonary:     Effort: Pulmonary effort is normal. No respiratory distress.     Breath sounds: No wheezing.  Abdominal:     General: Bowel sounds are normal. There is no distension.     Palpations: Abdomen is soft. There is no mass.     Tenderness: There is no abdominal tenderness.  Musculoskeletal:        General: No deformity. Normal range of motion.     Cervical back: Normal range of motion and neck supple.  Skin:    General: Skin is warm and dry.     Findings: No erythema or rash.  Neurological:     Mental Status: He is alert and oriented to person, place, and time. Mental status is at baseline.     Cranial Nerves: No cranial nerve deficit.     Coordination: Coordination normal.  Psychiatric:        Mood and Affect: Mood normal.        Latest Ref Rng & Units 12/24/2022    2:20 PM  CMP  Glucose 70 - 99 mg/dL 144   BUN 8 - 23 mg/dL 46   Creatinine 0.61 - 1.24 mg/dL 2.56   Sodium 135 - 145 mmol/L 132   Potassium 3.5 - 5.1 mmol/L 4.8   Chloride 98 - 111 mmol/L 101   CO2 22 - 32 mmol/L 21   Calcium 8.9 - 10.3 mg/dL 8.5   Total  Protein 6.5 - 8.1 g/dL 6.5   Total Bilirubin 0.3 - 1.2 mg/dL 0.3   Alkaline Phos 38 - 126 U/L 54   AST 15 - 41 U/L 16   ALT 0 - 44 U/L 14       Latest Ref Rng & Units 12/24/2022    2:20 PM  CBC  WBC 4.0 - 10.5 K/uL 7.6   Hemoglobin 13.0 - 17.0 g/dL 11.4   Hematocrit 39.0 - 52.0 % 34.6  Platelets 150 - 400 K/uL 223     CARDIAC CATHETERIZATION  Result Date: 12/15/2022   Prox LAD to Mid LAD lesion is 50% stenosed.   Dist LAD lesion is 75% stenosed.   Dist RCA lesion is 40% stenosed.   Prox RCA to Mid RCA lesion is 40% stenosed.   Ost 1st Mrg to 1st Mrg lesion is 50% stenosed.   Non-stenotic Prox Cx lesion was previously treated.   Non-stenotic Prox RCA lesion was previously treated.   The left ventricular systolic function is normal.   LV end diastolic pressure is normal.   The left ventricular ejection fraction is 55-65% by visual estimate. Conclusion Left heart cath right radial approach because of unstable anginal symptoms known coronary disease Left ventriculogram preserved left ventricular function EF around 55 to 60% mild left ventricular enlargement Coronaries Left main large minor irregularities LAD large with diffuse minor irregularities and a 50% mid lesion distal 50-75 Circumflex medium to large minor irregularities widely patent proximal stent OM1 diffusely diseased 50-75 small vessel RCA large proximal stent widely patent 50% mid lesion mixed dominant All vessels with TIMI-3 flow Intervention deferred not indicated   DG Chest 2 View  Result Date: 12/12/2022 CLINICAL DATA:  Mid chest pain. EXAM: CHEST - 2 VIEW COMPARISON:  August 15, 2022 FINDINGS: The cardiac silhouette is mildly enlarged and unchanged in size. Both lungs are clear. The visualized skeletal structures are unremarkable. IMPRESSION: No active cardiopulmonary disease. Electronically Signed   By: Virgina Norfolk M.D.   On: 12/12/2022 02:32

## 2022-12-26 NOTE — Assessment & Plan Note (Signed)
History of Vitamin B12 deficiency, negative intrinsic factor antibody and antiparital antibody.  B12 level is stable.  Recommend patient to continue oral vitamin B12 supplementation 2-3 times per week

## 2022-12-26 NOTE — Assessment & Plan Note (Signed)
With history of high-grade dysplastic.  S/p resection.  Continue follow-up with gastroenterology.

## 2022-12-26 NOTE — Assessment & Plan Note (Signed)
#  Anemia in CKD Labs are reviewed and are discussed with patient Hemoglobin has decreased to 11.  Iron panel is stable, ferritin is not at goal. Recommend IV Venofer weekly x 2.  After that recommend patient to continue oral iron supplementation once daily.

## 2022-12-27 DIAGNOSIS — E1122 Type 2 diabetes mellitus with diabetic chronic kidney disease: Secondary | ICD-10-CM | POA: Diagnosis not present

## 2022-12-27 DIAGNOSIS — E1142 Type 2 diabetes mellitus with diabetic polyneuropathy: Secondary | ICD-10-CM | POA: Diagnosis not present

## 2022-12-27 DIAGNOSIS — E1129 Type 2 diabetes mellitus with other diabetic kidney complication: Secondary | ICD-10-CM | POA: Diagnosis not present

## 2022-12-27 DIAGNOSIS — N184 Chronic kidney disease, stage 4 (severe): Secondary | ICD-10-CM | POA: Diagnosis not present

## 2022-12-27 DIAGNOSIS — I2510 Atherosclerotic heart disease of native coronary artery without angina pectoris: Secondary | ICD-10-CM | POA: Diagnosis not present

## 2022-12-27 DIAGNOSIS — R809 Proteinuria, unspecified: Secondary | ICD-10-CM | POA: Diagnosis not present

## 2022-12-27 DIAGNOSIS — I252 Old myocardial infarction: Secondary | ICD-10-CM | POA: Diagnosis not present

## 2022-12-27 DIAGNOSIS — E1159 Type 2 diabetes mellitus with other circulatory complications: Secondary | ICD-10-CM | POA: Diagnosis not present

## 2022-12-27 DIAGNOSIS — Z794 Long term (current) use of insulin: Secondary | ICD-10-CM | POA: Diagnosis not present

## 2023-01-07 ENCOUNTER — Other Ambulatory Visit: Payer: Self-pay | Admitting: Oncology

## 2023-01-07 MED FILL — Iron Sucrose Inj 20 MG/ML (Fe Equiv): INTRAVENOUS | Qty: 10 | Status: AC

## 2023-01-08 ENCOUNTER — Inpatient Hospital Stay: Payer: Medicare Other

## 2023-01-08 VITALS — BP 131/63 | HR 64 | Resp 16

## 2023-01-08 DIAGNOSIS — N184 Chronic kidney disease, stage 4 (severe): Secondary | ICD-10-CM

## 2023-01-08 DIAGNOSIS — E538 Deficiency of other specified B group vitamins: Secondary | ICD-10-CM | POA: Diagnosis not present

## 2023-01-08 DIAGNOSIS — Z7902 Long term (current) use of antithrombotics/antiplatelets: Secondary | ICD-10-CM | POA: Diagnosis not present

## 2023-01-08 DIAGNOSIS — D631 Anemia in chronic kidney disease: Secondary | ICD-10-CM | POA: Diagnosis not present

## 2023-01-08 DIAGNOSIS — K2270 Barrett's esophagus without dysplasia: Secondary | ICD-10-CM | POA: Diagnosis not present

## 2023-01-08 DIAGNOSIS — Z7982 Long term (current) use of aspirin: Secondary | ICD-10-CM | POA: Diagnosis not present

## 2023-01-08 MED ORDER — SODIUM CHLORIDE 0.9 % IV SOLN
200.0000 mg | Freq: Once | INTRAVENOUS | Status: AC
  Administered 2023-01-08: 200 mg via INTRAVENOUS
  Filled 2023-01-08: qty 200

## 2023-01-08 MED ORDER — SODIUM CHLORIDE 0.9 % IV SOLN
Freq: Once | INTRAVENOUS | Status: AC
  Filled 2023-01-08: qty 250

## 2023-01-10 DIAGNOSIS — I509 Heart failure, unspecified: Secondary | ICD-10-CM | POA: Diagnosis not present

## 2023-01-10 DIAGNOSIS — Z8673 Personal history of transient ischemic attack (TIA), and cerebral infarction without residual deficits: Secondary | ICD-10-CM | POA: Diagnosis not present

## 2023-01-10 DIAGNOSIS — J449 Chronic obstructive pulmonary disease, unspecified: Secondary | ICD-10-CM | POA: Diagnosis not present

## 2023-01-10 DIAGNOSIS — Z794 Long term (current) use of insulin: Secondary | ICD-10-CM | POA: Diagnosis not present

## 2023-01-10 DIAGNOSIS — I214 Non-ST elevation (NSTEMI) myocardial infarction: Secondary | ICD-10-CM | POA: Diagnosis not present

## 2023-01-10 DIAGNOSIS — I10 Essential (primary) hypertension: Secondary | ICD-10-CM | POA: Diagnosis not present

## 2023-01-10 DIAGNOSIS — I2510 Atherosclerotic heart disease of native coronary artery without angina pectoris: Secondary | ICD-10-CM | POA: Diagnosis not present

## 2023-01-10 DIAGNOSIS — Z955 Presence of coronary angioplasty implant and graft: Secondary | ICD-10-CM | POA: Diagnosis not present

## 2023-01-10 DIAGNOSIS — E119 Type 2 diabetes mellitus without complications: Secondary | ICD-10-CM | POA: Diagnosis not present

## 2023-01-10 DIAGNOSIS — E782 Mixed hyperlipidemia: Secondary | ICD-10-CM | POA: Diagnosis not present

## 2023-01-10 DIAGNOSIS — N184 Chronic kidney disease, stage 4 (severe): Secondary | ICD-10-CM | POA: Diagnosis not present

## 2023-01-10 DIAGNOSIS — I739 Peripheral vascular disease, unspecified: Secondary | ICD-10-CM | POA: Diagnosis not present

## 2023-01-14 ENCOUNTER — Telehealth: Payer: Self-pay

## 2023-01-14 NOTE — Telephone Encounter (Signed)
-----   Message from Earlie Server, MD sent at 01/12/2023  1:14 PM EST ----- Pt's wife called and reports patient has gout flare after IV venofer. He will call his PCP/nephrologist for treatment. I recommend to postpone his IV venofer on 2/7, postpone for 4 weeks. Wife is aware about the plan.  zy

## 2023-01-15 ENCOUNTER — Encounter: Payer: Self-pay | Admitting: Oncology

## 2023-01-15 ENCOUNTER — Inpatient Hospital Stay: Payer: Medicare Other

## 2023-01-15 DIAGNOSIS — C44219 Basal cell carcinoma of skin of left ear and external auricular canal: Secondary | ICD-10-CM | POA: Diagnosis not present

## 2023-01-15 NOTE — Telephone Encounter (Signed)
Please r/s iron infusion as requested by Dr. Tasia Catchings and inform pt of appt

## 2023-01-16 ENCOUNTER — Other Ambulatory Visit: Payer: Self-pay | Admitting: Physician Assistant

## 2023-01-16 ENCOUNTER — Inpatient Hospital Stay: Payer: Medicare Other

## 2023-01-24 ENCOUNTER — Telehealth: Payer: Self-pay | Admitting: Gastroenterology

## 2023-01-24 NOTE — Telephone Encounter (Signed)
Pt is aware and expressed understanding to hold Eliquis 5 days prior and restart 2 days after... Clearance sent to be scanned

## 2023-01-24 NOTE — Telephone Encounter (Signed)
Patient calling asking when he needs to stop medications before endoscopy. Requesting call back.

## 2023-01-24 NOTE — Telephone Encounter (Signed)
I spoke to pt he is aware

## 2023-01-30 ENCOUNTER — Encounter: Payer: Self-pay | Admitting: Gastroenterology

## 2023-01-31 ENCOUNTER — Encounter: Payer: Self-pay | Admitting: Gastroenterology

## 2023-01-31 ENCOUNTER — Ambulatory Visit: Payer: Medicare Other | Admitting: Registered Nurse

## 2023-01-31 ENCOUNTER — Ambulatory Visit
Admission: RE | Admit: 2023-01-31 | Discharge: 2023-01-31 | Disposition: A | Discharge status: Home / Self Care | Payer: Medicare Other | Source: Ambulatory Visit | Attending: Gastroenterology | Admitting: Gastroenterology

## 2023-01-31 ENCOUNTER — Encounter
Admission: RE | Disposition: A | Discharge status: Home / Self Care | Payer: Self-pay | Source: Ambulatory Visit | Attending: Gastroenterology

## 2023-01-31 DIAGNOSIS — E785 Hyperlipidemia, unspecified: Secondary | ICD-10-CM | POA: Diagnosis not present

## 2023-01-31 DIAGNOSIS — Z955 Presence of coronary angioplasty implant and graft: Secondary | ICD-10-CM | POA: Insufficient documentation

## 2023-01-31 DIAGNOSIS — E1122 Type 2 diabetes mellitus with diabetic chronic kidney disease: Secondary | ICD-10-CM | POA: Diagnosis not present

## 2023-01-31 DIAGNOSIS — K222 Esophageal obstruction: Secondary | ICD-10-CM | POA: Insufficient documentation

## 2023-01-31 DIAGNOSIS — Z09 Encounter for follow-up examination after completed treatment for conditions other than malignant neoplasm: Secondary | ICD-10-CM | POA: Diagnosis not present

## 2023-01-31 DIAGNOSIS — I252 Old myocardial infarction: Secondary | ICD-10-CM | POA: Diagnosis not present

## 2023-01-31 DIAGNOSIS — I509 Heart failure, unspecified: Secondary | ICD-10-CM | POA: Diagnosis not present

## 2023-01-31 DIAGNOSIS — I110 Hypertensive heart disease with heart failure: Secondary | ICD-10-CM | POA: Diagnosis not present

## 2023-01-31 DIAGNOSIS — G4730 Sleep apnea, unspecified: Secondary | ICD-10-CM | POA: Insufficient documentation

## 2023-01-31 DIAGNOSIS — M109 Gout, unspecified: Secondary | ICD-10-CM | POA: Insufficient documentation

## 2023-01-31 DIAGNOSIS — I130 Hypertensive heart and chronic kidney disease with heart failure and stage 1 through stage 4 chronic kidney disease, or unspecified chronic kidney disease: Secondary | ICD-10-CM | POA: Diagnosis not present

## 2023-01-31 DIAGNOSIS — R1310 Dysphagia, unspecified: Secondary | ICD-10-CM | POA: Diagnosis not present

## 2023-01-31 DIAGNOSIS — Z87891 Personal history of nicotine dependence: Secondary | ICD-10-CM | POA: Insufficient documentation

## 2023-01-31 DIAGNOSIS — K2100 Gastro-esophageal reflux disease with esophagitis, without bleeding: Secondary | ICD-10-CM | POA: Insufficient documentation

## 2023-01-31 DIAGNOSIS — K2210 Ulcer of esophagus without bleeding: Secondary | ICD-10-CM | POA: Diagnosis not present

## 2023-01-31 DIAGNOSIS — Z6831 Body mass index (BMI) 31.0-31.9, adult: Secondary | ICD-10-CM | POA: Insufficient documentation

## 2023-01-31 DIAGNOSIS — I5032 Chronic diastolic (congestive) heart failure: Secondary | ICD-10-CM | POA: Insufficient documentation

## 2023-01-31 DIAGNOSIS — E1051 Type 1 diabetes mellitus with diabetic peripheral angiopathy without gangrene: Secondary | ICD-10-CM | POA: Diagnosis not present

## 2023-01-31 DIAGNOSIS — Z794 Long term (current) use of insulin: Secondary | ICD-10-CM | POA: Diagnosis not present

## 2023-01-31 DIAGNOSIS — J449 Chronic obstructive pulmonary disease, unspecified: Secondary | ICD-10-CM | POA: Diagnosis not present

## 2023-01-31 DIAGNOSIS — N184 Chronic kidney disease, stage 4 (severe): Secondary | ICD-10-CM | POA: Diagnosis not present

## 2023-01-31 DIAGNOSIS — I25119 Atherosclerotic heart disease of native coronary artery with unspecified angina pectoris: Secondary | ICD-10-CM | POA: Diagnosis not present

## 2023-01-31 HISTORY — PX: ESOPHAGOGASTRODUODENOSCOPY (EGD) WITH PROPOFOL: SHX5813

## 2023-01-31 LAB — GLUCOSE, CAPILLARY: Glucose-Capillary: 95 mg/dL (ref 70–99)

## 2023-01-31 SURGERY — ESOPHAGOGASTRODUODENOSCOPY (EGD) WITH PROPOFOL
Anesthesia: General

## 2023-01-31 MED ORDER — PROPOFOL 10 MG/ML IV BOLUS
INTRAVENOUS | Status: DC | PRN
  Administered 2023-01-31: 80 mg via INTRAVENOUS

## 2023-01-31 MED ORDER — LIDOCAINE HCL (PF) 2 % IJ SOLN
INTRAMUSCULAR | Status: AC
  Filled 2023-01-31: qty 25

## 2023-01-31 MED ORDER — LIDOCAINE HCL (CARDIAC) PF 100 MG/5ML IV SOSY
PREFILLED_SYRINGE | INTRAVENOUS | Status: DC | PRN
  Administered 2023-01-31: 100 mg via INTRAVENOUS

## 2023-01-31 MED ORDER — GLYCOPYRROLATE 0.2 MG/ML IJ SOLN
INTRAMUSCULAR | Status: AC
  Filled 2023-01-31: qty 1

## 2023-01-31 MED ORDER — DEXMEDETOMIDINE HCL IN NACL 80 MCG/20ML IV SOLN
INTRAVENOUS | Status: AC
  Filled 2023-01-31: qty 20

## 2023-01-31 MED ORDER — SODIUM CHLORIDE 0.9 % IV SOLN: INTRAVENOUS | Status: DC

## 2023-01-31 MED ORDER — EPHEDRINE 5 MG/ML INJ
INTRAVENOUS | Status: AC
  Filled 2023-01-31: qty 5

## 2023-01-31 MED ORDER — PHENYLEPHRINE 80 MCG/ML (10ML) SYRINGE FOR IV PUSH (FOR BLOOD PRESSURE SUPPORT)
PREFILLED_SYRINGE | INTRAVENOUS | Status: AC
  Filled 2023-01-31: qty 10

## 2023-01-31 MED ORDER — PROPOFOL 1000 MG/100ML IV EMUL
INTRAVENOUS | Status: AC
  Filled 2023-01-31: qty 200

## 2023-01-31 MED ORDER — EPHEDRINE SULFATE (PRESSORS) 50 MG/ML IJ SOLN
INTRAMUSCULAR | Status: DC | PRN
  Administered 2023-01-31 (×2): 10 mg via INTRAVENOUS

## 2023-01-31 MED ORDER — PROPOFOL 500 MG/50ML IV EMUL
INTRAVENOUS | Status: DC | PRN
  Administered 2023-01-31: 150 ug/kg/min via INTRAVENOUS

## 2023-01-31 MED ORDER — PROPOFOL 1000 MG/100ML IV EMUL
INTRAVENOUS | Status: AC
  Filled 2023-01-31: qty 100

## 2023-01-31 NOTE — Anesthesia Postprocedure Evaluation (Signed)
Anesthesia Post Note  Patient: Mike Decker  Procedure(s) Performed: ESOPHAGOGASTRODUODENOSCOPY (EGD) WITH PROPOFOL  Patient location during evaluation: PACU Anesthesia Type: General Level of consciousness: awake Pain management: pain level controlled Vital Signs Assessment: post-procedure vital signs reviewed and stable Respiratory status: spontaneous breathing Cardiovascular status: stable Anesthetic complications: no   No notable events documented.   Last Vitals:  Vitals:   01/31/23 0831 01/31/23 0841  BP: 110/66 (!) 127/54  Pulse: (!) 52 (!) 53  Resp: 13 14  Temp:    SpO2: 97% 99%    Last Pain:  Vitals:   01/31/23 0841  TempSrc:   PainSc: 0-No pain                 VAN STAVEREN,Dawanna Grauberger

## 2023-01-31 NOTE — Anesthesia Preprocedure Evaluation (Signed)
Anesthesia Evaluation  Patient identified by MRN, date of birth, ID band Patient awake    Reviewed: Allergy & Precautions, NPO status , Patient's Chart, lab work & pertinent test results  Airway Mallampati: III  TM Distance: >3 FB Neck ROM: full    Dental  (+) Teeth Intact   Pulmonary neg pulmonary ROS, sleep apnea and Continuous Positive Airway Pressure Ventilation , COPD, former smoker   Pulmonary exam normal  + decreased breath sounds      Cardiovascular Exercise Tolerance: Poor hypertension, Pt. on medications + angina  + CAD, + Past MI, + Cardiac Stents and +CHF  negative cardio ROS Normal cardiovascular exam Rhythm:Regular Rate:Normal     Neuro/Psych    Depression    TIAnegative neurological ROS  negative psych ROS   GI/Hepatic negative GI ROS, Neg liver ROS,,,  Endo/Other  negative endocrine ROSdiabetes, Type 1, Insulin Dependent  Morbid obesity  Renal/GU negative Renal ROS  negative genitourinary   Musculoskeletal   Abdominal  (+) + obese  Peds negative pediatric ROS (+)  Hematology negative hematology ROS (+)   Anesthesia Other Findings Past Medical History: No date: Anemia No date: Arthritis No date: Atrioventricular canal (AVC)     Comment:  irregular heart beats 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 No date: Bronchiolitis 2002: 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, esphageal No date: Chronic diastolic CHF (congestive heart failure) (HCC) No date: Colon polyp No date: Diabetes mellitus without complication (HCC) No date: Diverticulosis No date: Gout No date: Heart disease No date: Hemangioma     Comment:  liver No date: Hyperlipidemia No date: Hypertension No date: Myocardial infarct (HCC) No date: Ocular hypertension No date: Peripheral vascular disease ([place]) No date: Skin cancer No date: 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[place]) No date: Sleep apnea 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  Past Surgical History: 2011,  2012: CATARACT EXTRACTION 12/14/2018: COLONOSCOPY; N/A     Comment:  Procedure: COLONOSCOPY;  Surgeon: Virgel Manifold,               MD;  Location: [facility] ENDOSCOPY;  Service: Endoscopy;                Laterality: N/A; 12/10/2010: CORONARY ANGIOPLASTY WITH STENT PLACEMENT 12/29/2019: CORONARY STENT INTERVENTION; N/A     Comment:  Procedure: CORONARY STENT INTERVENTION;  Surgeon: Nelva Bush, MD;  Location: [place] CV LAB;                Service: Cardiovascular;  Laterality: N/A; 12/14/2018: ESOPHAGOGASTRODUODENOSCOPY; N/A     Comment:  Procedure: ESOPHAGOGASTRODUODENOSCOPY (EGD);  Surgeon:               Virgel Manifold, MD;  Location: [facility] ENDOSCOPY;                Service: Endoscopy;  Laterality: N/A; 06/01/2016: ESOPHAGOGASTRODUODENOSCOPY (EGD) WITH PROPOFOL; N/A     Comment:  Procedure: ESOPHAGOGASTRODUODENOSCOPY (EGD) WITH               PROPOFOL;  Surgeon: Manya Silvas, MD;  Location: [facility]              ENDOSCOPY;  Service: Endoscopy;  Laterality: N/A; No date: FEMORAL ARTERY STENT; Right No date: HERNIA REPAIR     Comment:  x2 No date: INTRAOCULAR LENS INSERTION 05/09/2017: LEFT HEART CATH AND CORONARY ANGIOGRAPHY; N/A     Comment:  Procedure: Left Heart Cath and Coronary Angiography;  Surgeon: Yolonda Kida, MD;  Location: [place]              CV LAB;  Service: Cardiovascular;  Laterality: N/A; 12/08/2018: LEFT HEART CATH AND CORONARY ANGIOGRAPHY; N/A     Comment:  Procedure: LEFT HEART CATH AND CORONARY ANGIOGRAPHY and               possible PCI and stent;  Surgeon: Yolonda Kida, MD;              Location: [place] CV LAB;  Service: Cardiovascular;              Laterality: N/A; 12/29/2019: LEFT HEART CATH AND CORONARY ANGIOGRAPHY; N/A     Comment:  Procedure: LEFT HEART CATH AND CORONARY ANGIOGRAPHY;                Surgeon: Nelva Bush, MD;  Location: [place]               CV LAB;  Service:  Cardiovascular;  Laterality: N/A; 12/13/2022: LEFT HEART CATH AND CORONARY ANGIOGRAPHY; Left     Comment:  Procedure: LEFT HEART CATH AND CORONARY ANGIOGRAPHY;                Surgeon: Yolonda Kida, MD;  Location: [place]              CV LAB;  Service: Cardiovascular;  Laterality: Left; No date: NOSE SURGERY     Comment:  submucous resection 12/10/2000: PROSTATE SURGERY No date: ROTATOR CUFF REPAIR; Right  BMI    Body Mass Index: 31.31 kg/m      Reproductive/Obstetrics negative OB ROS                             Anesthesia Physical Anesthesia Plan  ASA: 4  Anesthesia Plan: General   Post-op Pain Management:    Induction: Intravenous  PONV Risk Score and Plan: Propofol infusion and TIVA  Airway Management Planned: Natural Airway  Additional Equipment:   Intra-op Plan:   Post-operative Plan:   Informed Consent: I have reviewed the patients History and Physical, chart, labs and discussed the procedure including the risks, benefits and alternatives for the proposed anesthesia with the patient or authorized representative who has indicated his/her understanding and acceptance.     Dental Advisory Given  Plan Discussed with: CRNA and Surgeon  Anesthesia Plan Comments:        Anesthesia Quick Evaluation

## 2023-01-31 NOTE — Op Note (Addendum)
[facility] Gastroenterology Patient Name: Mike Decker Procedure Date: 01/31/2023 6:38 AM MRN: [id number] Account #: [id number] Date of Birth: 07-28-1941 Admit Type: Outpatient Age: 82 Room: [facility] ENDO ROOM 3 Gender: Male Note Status: Finalized Instrument Name: Michaelle Birks [id number] Procedure:             Upper GI endoscopy Indications:           Dysphagia Providers:             [name] MD, MD Referring MD:          Cletis Athens, MD (Referring MD) Medicines:             Propofol per Anesthesia Complications:         No immediate complications. Procedure:             Pre-Anesthesia Assessment:                        - Prior to the procedure, a History and Physical was                         performed, and patient medications and allergies were                         reviewed. The patient's tolerance of previous                         anesthesia was also reviewed. The risks and benefits                         of the procedure and the sedation options and risks                         were discussed with the patient. All questions were                         answered, and informed consent was obtained. Prior                         Anticoagulants: The patient has taken no anticoagulant                         or antiplatelet agents. ASA Grade Assessment: II - A                         patient with mild systemic disease. After reviewing                         the risks and benefits, the patient was deemed in                         satisfactory condition to undergo the procedure.                        After obtaining informed consent, the endoscope was                         passed under direct vision. Throughout the procedure,  the patient's blood pressure, pulse, and oxygen                         saturations were monitored continuously. The Endoscope                         was introduced through the mouth, and advanced to the                          second part of duodenum. The upper GI endoscopy was                         accomplished without difficulty. The patient tolerated                         the procedure well. Findings:      Two superficial esophageal ulcers with no bleeding and no stigmata of       recent bleeding were found.      One benign-appearing, intrinsic moderate stenosis was found at the       gastroesophageal junction. The stenosis was traversed. A TTS dilator was       passed through the scope. Dilation with a 12-13.5-15 mm balloon dilator       was performed to 15 mm. The dilation site was examined following       endoscope reinsertion and showed moderate improvement in luminal       narrowing.      The stomach was normal.      The examined duodenum was normal. Impression:            - Esophageal ulcers with no bleeding and no stigmata                         of recent bleeding.                        - Benign-appearing esophageal stenosis. Dilated.                        - Normal stomach.                        - Normal examined duodenum.                        - No specimens collected. Recommendation:        - Discharge patient to home.                        - Resume previous diet.                        - Continue present medications.                        - Repeat upper endoscopy PRN for retreatment. Procedure Code(s):     --- Professional ---                        [phone], Esophagogastroduodenoscopy, flexible,  transoral; with transendoscopic balloon dilation of                         esophagus (less than 30 mm diameter) Diagnosis Code(s):     --- Professional ---                        R13.10, Dysphagia, unspecified                        K21.00, Gastro-esophageal reflux disease with                         esophagitis, without bleeding                        K22.2, Esophageal obstruction CPT copyright 2022 American Medical Association. All rights  reserved. The codes documented in this report are preliminary and upon coder review may  be revised to meet current compliance requirements. [name] MD, MD 01/31/2023 8:17:21 AM This report has been signed electronically. Number of Addenda: 0 Note Initiated On: 01/31/2023 6:38 AM Estimated Blood Loss:  Estimated blood loss: none.      [facility]

## 2023-01-31 NOTE — H&P (Signed)
[name], MD [facility] [address], [place], [place] [address] Phone:[phone] Fax : [phone]  Primary Care Physician:  Cletis Athens, MD Primary Gastroenterologist:  Dr. Allen Norris  Pre-Procedure History & Physical: HPI:  Mike Decker is a 82 y.o. male is here for an endoscopy.   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[place])    Sleep apnea    Vitreoretinal degeneration     Past Surgical History:  Procedure Laterality Date   CATARACT EXTRACTION  2011, 2012   COLONOSCOPY N/A 12/14/2018   Procedure: COLONOSCOPY;  Surgeon: Virgel Manifold, MD;  Location: [facility] ENDOSCOPY;  Service: Endoscopy;  Laterality: N/A;   CORONARY ANGIOPLASTY WITH STENT PLACEMENT  12/10/2010   CORONARY STENT INTERVENTION N/A 12/29/2019   Procedure: CORONARY STENT INTERVENTION;  Surgeon: Nelva Bush, MD;  Location: [place] CV LAB;  Service: Cardiovascular;  Laterality: N/A;   ESOPHAGOGASTRODUODENOSCOPY N/A 12/14/2018   Procedure: ESOPHAGOGASTRODUODENOSCOPY (EGD);  Surgeon: Virgel Manifold, MD;  Location: [facility] ENDOSCOPY;  Service: Endoscopy;  Laterality: N/A;   ESOPHAGOGASTRODUODENOSCOPY (EGD) WITH PROPOFOL N/A 06/01/2016   Procedure: ESOPHAGOGASTRODUODENOSCOPY (EGD) WITH PROPOFOL;  Surgeon: Manya Silvas, MD;  Location: [facility] ENDOSCOPY;  Service: Endoscopy;  Laterality: N/A;   FEMORAL ARTERY STENT Right    HERNIA REPAIR     x2   INTRAOCULAR LENS INSERTION     LEFT  HEART CATH AND CORONARY ANGIOGRAPHY N/A 05/09/2017   Procedure: Left Heart Cath and Coronary Angiography;  Surgeon: Yolonda Kida, MD;  Location: [place] CV LAB;  Service: Cardiovascular;  Laterality: N/A;   LEFT HEART CATH AND CORONARY ANGIOGRAPHY N/A 12/08/2018   Procedure: LEFT HEART CATH AND CORONARY ANGIOGRAPHY and possible PCI and stent;  Surgeon: Yolonda Kida, MD;  Location: [place] CV LAB;  Service: Cardiovascular;  Laterality: N/A;   LEFT HEART CATH AND CORONARY ANGIOGRAPHY N/A 12/29/2019   Procedure: LEFT HEART CATH AND CORONARY ANGIOGRAPHY;  Surgeon: Nelva Bush, MD;  Location: [place] CV LAB;  Service: Cardiovascular;  Laterality: N/A;   LEFT HEART CATH AND CORONARY ANGIOGRAPHY Left 12/13/2022   Procedure: LEFT HEART CATH AND CORONARY ANGIOGRAPHY;  Surgeon: Yolonda Kida, MD;  Location: [place] CV LAB;  Service: Cardiovascular;  Laterality: Left;   NOSE SURGERY     submucous resection   PROSTATE SURGERY  12/10/2000   ROTATOR CUFF REPAIR Right     Prior to Admission medications   Medication Sig Start Date End Date Taking? Authorizing Provider  allopurinol (ZYLOPRIM) 100 MG tablet Take 100 mg by mouth daily.   Yes [provider]  amLODipine (NORVASC) 5 MG tablet Take 5 mg by mouth daily.   Yes [provider]  aspirin EC 81 MG tablet Take 81 mg by mouth daily. 04/26/22  Yes [provider]  calcitRIOL (ROCALTROL) 0.25 MCG capsule  Take 0.25 mcg by mouth daily. 01/10/22 04/12/23 Yes [provider]  citalopram (CELEXA) 10 MG tablet Take 1 tablet (10 mg total) by mouth daily. 03/12/22  Yes Masoud, Viann Shove, MD  [employer] VITAMIN B12 1000 MCG tablet TAKE 1 TABLET BY MOUTH EVERY DAY 11/21/21  Yes Earlie Server, MD  Cyanocobalamin 1000 MCG SUBL Take 1,000 mcg by mouth daily.   Yes [provider]  ferrous sulfate 325 (65 FE) MG EC tablet TAKE 1 TABLET BY MOUTH EVERY DAY 02/06/21  Yes Earlie Server, MD  insulin aspart (NOVOLOG)  100 UNIT/ML FlexPen 10 UNITS IN AM, 30 UNITS AT LUNCH, 30 UNITS AT [facility] 04/25/20  Yes [provider]  insulin glargine (LANTUS) 100 UNIT/ML injection Inject 0.35 mLs (35 Units total) into the skin at bedtime. This is a decrease from your previous 45 units nightly. Patient taking differently: Inject 56 Units into the skin at bedtime. 12/30/19  Yes Enzo Bi, MD  isosorbide mononitrate (IMDUR) 120 MG 24 hr tablet Take 240 mg by mouth daily. 12/16/20  Yes [provider]  magnesium oxide (MAG-OX) 400 (240 Mg) MG tablet Take 1 tablet by mouth daily. 04/25/22  Yes [provider]  methocarbamol (ROBAXIN) 500 MG tablet Take 500 mg by mouth at bedtime. 11/08/18  Yes [provider]  torsemide 40 MG TABS Take 40 mg by mouth 3 (three) times a week. Take once on MWF 08/20/22  Yes Wyvonnia Dusky, MD  Vitamin D, Ergocalciferol, 50000 units CAPS Take 1 capsule by mouth every 30 (thirty) days.   Yes [provider]  acetaminophen (TYLENOL) 500 MG tablet Take 1,000 mg by mouth every 8 (eight) hours as needed for mild pain.    [provider]  azelastine (ASTELIN) 0.1 % nasal spray USE 1 TO 2 SPRAYS IN EACH NOSTRIL TWICE A DAY 06/01/20   [provider]  cetirizine (ZYRTEC) 5 MG tablet Take 5 mg by mouth daily as needed for allergies or rhinitis.     [provider]  clopidogrel (PLAVIX) 75 MG tablet TAKE 1 TABLET BY MOUTH EVERY DAY 07/02/22   Cletis Athens, MD  colchicine 0.6 MG tablet Take 3 tablets (1.8 mg total) by mouth daily as needed (gout flares). 12/30/19   Enzo Bi, MD  EPINEPHrine (EPI-PEN) 0.3 mg/0.3 mL SOAJ injection Inject into the muscle as needed (anaphylaxis).     [provider]  fluticasone (FLONASE) 50 MCG/ACT nasal spray USE 1 SPRAY IN EACH NOSTRIL EVERY DAY 09/12/20   Cletis Athens, MD  furosemide (LASIX) 40 MG tablet Take 40 mg by mouth daily.    [provider]  hydrALAZINE (APRESOLINE) 50 MG tablet Take  50 mg by mouth 3 (three) times daily. 09/12/20   [provider]  JARDIANCE 10 MG TABS tablet Take 10 mg by mouth daily. 02/19/22   [provider]  metoprolol succinate (TOPROL-XL) 50 MG 24 hr tablet Take 1 tablet (50 mg total) by mouth daily. Take with or immediately following a meal. Patient not taking: Reported on 12/26/2022 08/18/22 12/26/22  Wyvonnia Dusky, MD  metoprolol tartrate (LOPRESSOR) 50 MG tablet Take 50 mg by mouth 2 (two) times daily.    [provider]  mometasone [facility]) 220 MCG/ACT inhaler Inhale 2 puffs into the lungs daily. 05/30/22   [provider]  montelukast (SINGULAIR) 10 MG tablet TAKE 1 TABLET BY MOUTH EVERYDAY AT BEDTIME 01/16/23   McDonough, Lauren K, PA-C  nitroGLYCERIN (NITROSTAT) 0.4 MG SL tablet Place 0.4  mg under the tongue every 5 (five) minutes x 3 doses as needed for chest pain.     [provider]  pantoprazole (PROTONIX) 40 MG tablet Take 1 tablet (40 mg total) by mouth 2 (two) times daily before a meal. 12/17/19   Amin, Jeanella Flattery, MD  ranolazine (RANEXA) 500 MG 12 hr tablet Take 1,000 mg by mouth 2 (two) times daily. 06/06/22   [provider]  rosuvastatin (CRESTOR) 10 MG tablet Take 1 tablet (10 mg total) by mouth at bedtime. Patient taking differently: Take 40 mg by mouth at bedtime. 08/17/22 12/26/22  Wyvonnia Dusky, MD  rosuvastatin (CRESTOR) 40 MG tablet Take 40 mg by mouth daily.    [provider]  sodium bicarbonate 650 MG tablet Take 650 mg by mouth 2 (two) times daily.    [provider]  sodium fluoride (DENTA 5000 PLUS) 1.1 % CREA dental cream See admin instructions. 01/03/22   [provider]  Tiotropium Bromide-Olodaterol (STIOLTO RESPIMAT) 2.5-2.5 MCG/ACT AERS Inhale 1 each into the lungs daily.    [provider]    Allergies as of 12/21/2022 - Review Complete 12/20/2022  Allergen Reaction Noted   Gabapentin  08/29/2017   Peanut-containing drug  products Anaphylaxis 03/30/2014   Penicillins Hives and Rash 03/30/2014   Bee venom Swelling 03/30/2014   Influenza vaccines Hives 03/30/2014   Inh [isoniazid] Hives 03/30/2014   Jardiance [empagliflozin] Other (See Comments) 09/15/2021   Kenalog [triamcinolone acetonide] Hives 03/30/2014   Levaquin [levofloxacin] Other (See Comments) 10/20/2018   Lisinopril  08/11/2019   Nalfon [fenoprofen calcium] Hives 03/30/2014   Naproxen  02/27/2018   Peanut oil  02/27/2018   Nsaids Rash     Family History  Problem Relation Age of Onset   Arthritis Mother    Stroke Maternal Grandfather    Breast cancer Neg Hx     Social History   Socioeconomic History   Marital status: Married    Spouse name: Not on file   Number of children: 2   Years of education: Secretary/administrator 3 years   Highest education level: Associate degree: occupational, Hotel manager, or vocational program  Occupational History   Occupation: retired  Tobacco Use   Smoking status: Former    Years: 20.00    Types: Cigarettes    Quit date: 12/10/1976    Years since quitting: 46.1   Smokeless tobacco: Never  Vaping Use   Vaping Use: Never used  Substance and Sexual Activity   Alcohol use: No   Drug use: No   Sexual activity: Not Currently  Other Topics Concern   Not on file  Social History Narrative   Not on file   Social Determinants of Health   Financial Resource Strain: Low Risk  (09/15/2021)   Overall Financial Resource Strain (CARDIA)    Difficulty of Paying Living Expenses: Not very hard  Food Insecurity: No Food Insecurity (10/15/2022)   Hunger Vital Sign    Worried About Running Out of Food in the Last Year: Never true    Ran Out of Food in the Last Year: Never true  Transportation Needs: No Transportation Needs (10/15/2022)   PRAPARE - Hydrologist (Medical): No    Lack of Transportation (Non-Medical): No  Physical Activity: Insufficiently Active (09/15/2021)   Exercise Vital Sign     Days of Exercise per Week: 7 days    Minutes of Exercise per Session: 20 min  Stress: No Stress Concern Present (09/15/2021)  [employer] of Occupational [employer] - Occupational Stress Questionnaire    Feeling of Stress : Not at all  Social Connections: Moderately Isolated (09/15/2021)   Social Connection and Isolation Panel [NHANES]    Frequency of Communication with Friends and Family: Once a week    Frequency of Social Gatherings with Friends and Family: Once a week    Attends Religious Services: More than 4 times per year    Active Member of [employer] or Organizations: No    Attends Archivist Meetings: Never    Marital Status: Married  Human resources officer Violence: Not At Risk (08/16/2022)   Humiliation, Afraid, Rape, and Kick questionnaire    Fear of Current or Ex-Partner: No    Emotionally Abused: No    Physically Abused: No    Sexually Abused: No    Review of Systems: See HPI, otherwise negative ROS  Physical Exam: BP (!) 131/53   Pulse (!) 55   Temp (!) 96.3 F (35.7 C) (Temporal)   Resp 16   Ht 5' 6"$  (1.676 m)   Wt 88 kg   SpO2 100%   BMI 31.31 kg/m  General:   Alert,  pleasant and cooperative in NAD Head:  Normocephalic and atraumatic. Neck:  Supple; no masses or thyromegaly. Lungs:  Clear throughout to auscultation.    Heart:  Regular rate and rhythm. Abdomen:  Soft, nontender and nondistended. Normal bowel sounds, without guarding, and without rebound.   Neurologic:  Alert and  oriented x4;  grossly normal neurologically.  Impression/Plan: Mike Decker is here for an endoscopy to be performed for dysphagia  Risks, benefits, limitations, and alternatives regarding  endoscopy have been reviewed with the patient.  Questions have been answered.  All parties agreeable.   [name], MD  01/31/2023, 7:48 AM

## 2023-01-31 NOTE — Transfer of Care (Signed)
Immediate Anesthesia Transfer of Care Note  Patient: Mike Decker  Procedure(s) Performed: ESOPHAGOGASTRODUODENOSCOPY (EGD) WITH PROPOFOL  Patient Location: Endoscopy Unit  Anesthesia Type:General  Level of Consciousness: drowsy  Airway & Oxygen Therapy: Patient Spontanous Breathing  Post-op Assessment: Report given to RN and Post -op Vital signs reviewed and stable  Post vital signs: Reviewed and stable  Last Vitals: see EPIC flowsheet Vitals Value Taken Time  BP    Temp    Pulse    Resp    SpO2      Last Pain:  Vitals:   01/31/23 0708  TempSrc: Temporal  PainSc: 0-No pain         Complications: No notable events documented.

## 2023-02-01 ENCOUNTER — Encounter: Payer: Self-pay | Admitting: Gastroenterology

## 2023-02-12 ENCOUNTER — Inpatient Hospital Stay: Payer: Medicare Other | Attending: Oncology

## 2023-02-12 ENCOUNTER — Encounter: Payer: Self-pay | Admitting: Oncology

## 2023-02-12 VITALS — BP 133/55

## 2023-02-12 DIAGNOSIS — D631 Anemia in chronic kidney disease: Secondary | ICD-10-CM

## 2023-02-12 DIAGNOSIS — N184 Chronic kidney disease, stage 4 (severe): Secondary | ICD-10-CM | POA: Diagnosis not present

## 2023-02-12 MED ORDER — SODIUM CHLORIDE 0.9 % IV SOLN
Freq: Once | INTRAVENOUS | Status: AC
  Filled 2023-02-12: qty 250

## 2023-02-12 MED ORDER — SODIUM CHLORIDE 0.9 % IV SOLN
200.0000 mg | Freq: Once | INTRAVENOUS | Status: AC
  Administered 2023-02-12: 200 mg via INTRAVENOUS
  Filled 2023-02-12: qty 200

## 2023-02-12 NOTE — Patient Instructions (Signed)

## 2023-02-20 ENCOUNTER — Telehealth: Payer: Self-pay | Admitting: Internal Medicine

## 2023-02-20 NOTE — Telephone Encounter (Signed)
Left vm to confirm 02/27/2023 appointment-Toni

## 2023-02-25 DIAGNOSIS — I10 Essential (primary) hypertension: Secondary | ICD-10-CM | POA: Diagnosis not present

## 2023-02-25 DIAGNOSIS — N2581 Secondary hyperparathyroidism of renal origin: Secondary | ICD-10-CM | POA: Diagnosis not present

## 2023-02-25 DIAGNOSIS — N184 Chronic kidney disease, stage 4 (severe): Secondary | ICD-10-CM | POA: Diagnosis not present

## 2023-02-25 DIAGNOSIS — E1122 Type 2 diabetes mellitus with diabetic chronic kidney disease: Secondary | ICD-10-CM | POA: Diagnosis not present

## 2023-02-27 ENCOUNTER — Ambulatory Visit (INDEPENDENT_AMBULATORY_CARE_PROVIDER_SITE_OTHER): Payer: Medicare Other | Admitting: Internal Medicine

## 2023-02-27 DIAGNOSIS — R0602 Shortness of breath: Secondary | ICD-10-CM

## 2023-02-27 DIAGNOSIS — J4520 Mild intermittent asthma, uncomplicated: Secondary | ICD-10-CM

## 2023-03-04 DIAGNOSIS — I10 Essential (primary) hypertension: Secondary | ICD-10-CM | POA: Diagnosis not present

## 2023-03-04 DIAGNOSIS — N2581 Secondary hyperparathyroidism of renal origin: Secondary | ICD-10-CM | POA: Diagnosis not present

## 2023-03-04 DIAGNOSIS — E1122 Type 2 diabetes mellitus with diabetic chronic kidney disease: Secondary | ICD-10-CM | POA: Diagnosis not present

## 2023-03-04 DIAGNOSIS — N184 Chronic kidney disease, stage 4 (severe): Secondary | ICD-10-CM | POA: Diagnosis not present

## 2023-03-14 ENCOUNTER — Ambulatory Visit (INDEPENDENT_AMBULATORY_CARE_PROVIDER_SITE_OTHER): Payer: Medicare Other | Admitting: Physician Assistant

## 2023-03-14 ENCOUNTER — Telehealth: Payer: Self-pay | Admitting: Physician Assistant

## 2023-03-14 ENCOUNTER — Encounter: Payer: Self-pay | Admitting: Physician Assistant

## 2023-03-14 VITALS — BP 128/47 | HR 61 | Temp 97.8°F | Resp 16 | Ht 66.0 in | Wt 198.0 lb

## 2023-03-14 DIAGNOSIS — J4520 Mild intermittent asthma, uncomplicated: Secondary | ICD-10-CM | POA: Diagnosis not present

## 2023-03-14 DIAGNOSIS — I5022 Chronic systolic (congestive) heart failure: Secondary | ICD-10-CM

## 2023-03-14 DIAGNOSIS — J069 Acute upper respiratory infection, unspecified: Secondary | ICD-10-CM | POA: Diagnosis not present

## 2023-03-14 DIAGNOSIS — R0602 Shortness of breath: Secondary | ICD-10-CM | POA: Diagnosis not present

## 2023-03-14 DIAGNOSIS — J984 Other disorders of lung: Secondary | ICD-10-CM | POA: Diagnosis not present

## 2023-03-14 DIAGNOSIS — J441 Chronic obstructive pulmonary disease with (acute) exacerbation: Secondary | ICD-10-CM

## 2023-03-14 MED ORDER — AZITHROMYCIN 250 MG PO TABS: ORAL_TABLET | ORAL | 0 refills | Status: DC

## 2023-03-14 MED ORDER — MONTELUKAST SODIUM 10 MG PO TABS: ORAL_TABLET | ORAL | 1 refills | Status: DC

## 2023-03-14 MED ORDER — PREDNISONE 10 MG PO TABS: ORAL_TABLET | ORAL | 0 refills | Status: DC

## 2023-03-14 NOTE — Progress Notes (Signed)
[facility] [address] [place], [place] [address]  Pulmonary Sleep Medicine   Office Visit Note  Patient Name: Mike Decker DOB: 06/25/1941 MRN [id number]  Date of Service: 03/20/2023  Complaints/HPI: Pt is here for routine pulmonary follow up. Back in Jan had CP and went to hospital and was diagnosed with NSTEMI. He then did a cath once back in [place]. No stent needed. Has been feeling more SOB recently and has some fluid retention. Ankles not swollen in office today, but reports recent swelling and is on lasix. Also having some dizziness and nephrology dropped his hydralazine in half and this improved. BP has still been low though. He is supposed to be seeing a [facility] cardiology specialist, Dr. Allena Katz, in 2 weeks off of Dr. Glennis Brink recommendation. Gets SOB with walking. Coughing is worse but may be allergies. Was told he may need aortic valve replacement. Taking mucinex and Using cpap and sleeps on 2 pillows so unaware of dyspnea laying down as he never lays flat. Discussed checking walk to ensure oxygen not dropping. PFT stable.  ROS  General: (-) fever, (-) chills, (-) night sweats, (-) weakness Skin: (-) rashes, (-) itching,. Eyes: (-) visual changes, (-) redness, (-) itching. Nose and Sinuses: (-) nasal stuffiness or itchiness, (-) postnasal drip, (-) nosebleeds, (-) sinus trouble. Mouth and Throat: (-) sore throat, (-) hoarseness. Neck: (-) swollen glands, (-) enlarged thyroid, (-) neck pain. Respiratory: + cough, (-) bloody sputum, + shortness of breath, - wheezing. Cardiovascular: + ankle swelling, (-) chest pain. Lymphatic: (-) lymph node enlargement. Neurologic: (-) numbness, (-) tingling. Psychiatric: (-) anxiety, (-) depression   Current Medication: Outpatient Encounter Medications as of 03/14/2023  Medication Sig Note   acetaminophen (TYLENOL) 500 MG tablet Take 1,000 mg by mouth every 8 (eight) hours as needed for mild pain.     allopurinol (ZYLOPRIM) 100 MG tablet Take 100 mg by mouth daily.    amLODipine (NORVASC) 5 MG tablet Take 5 mg by mouth daily. 12/26/2022: Patient takes 2.5mg    aspirin EC 81 MG tablet Take 81 mg by mouth daily.    azelastine (ASTELIN) 0.1 % nasal spray USE 1 TO 2 SPRAYS IN EACH NOSTRIL TWICE A DAY    calcitRIOL (ROCALTROL) 0.25 MCG capsule Take 0.25 mcg by mouth daily.    cetirizine (ZYRTEC) 5 MG tablet Take 5 mg by mouth daily as needed for allergies or rhinitis.     citalopram (CELEXA) 10 MG tablet Take 1 tablet (10 mg total) by mouth daily.    clopidogrel (PLAVIX) 75 MG tablet TAKE 1 TABLET BY MOUTH EVERY DAY    colchicine 0.6 MG tablet Take 3 tablets (1.8 mg total) by mouth daily as needed (gout flares).    [employer] VITAMIN B12 1000 MCG tablet TAKE 1 TABLET BY MOUTH EVERY DAY 08/15/2022: Sunday, Tuesday and Thursday only   Cyanocobalamin 1000 MCG SUBL Take 1,000 mcg by mouth daily.    EPINEPHrine (EPI-PEN) 0.3 mg/0.3 mL SOAJ injection Inject into the muscle as needed (anaphylaxis).     ergocalciferol (VITAMIN D2) 1.25 MG (50000 UT) capsule Take 1,250 mcg by mouth daily.    ferrous sulfate 325 (65 FE) MG EC tablet TAKE 1 TABLET BY MOUTH EVERY DAY    fluticasone (FLONASE) 50 MCG/ACT nasal spray USE 1 SPRAY IN EACH NOSTRIL EVERY DAY    folic acid (FOLVITE) 400 MCG tablet Take 400 mcg by mouth daily.    furosemide (LASIX) 40 MG tablet Take 40 mg by mouth  daily.    hydrALAZINE (APRESOLINE) 50 MG tablet Take 50 mg by mouth 3 (three) times daily.    insulin aspart (NOVOLOG) 100 UNIT/ML FlexPen 10 UNITS IN AM, 30 UNITS AT LUNCH, 30 UNITS AT DINNER    insulin glargine (LANTUS) 100 UNIT/ML injection Inject 0.35 mLs (35 Units total) into the skin at bedtime. This is a decrease from your previous 45 units nightly. (Patient taking differently: Inject 56 Units into the skin at bedtime.)    isosorbide mononitrate (IMDUR) 120 MG 24 hr tablet Take 240 mg by mouth daily.    JARDIANCE 10 MG TABS tablet Take 10 mg by  mouth daily.    magnesium oxide (MAG-OX) 400 (240 Mg) MG tablet Take 1 tablet by mouth daily.    methocarbamol (ROBAXIN) 500 MG tablet Take 500 mg by mouth at bedtime.    metoprolol tartrate (LOPRESSOR) 50 MG tablet Take 50 mg by mouth 2 (two) times daily.    mometasone (ASMANEX) 220 MCG/ACT inhaler Inhale 2 puffs into the lungs daily.    nitroGLYCERIN (NITROSTAT) 0.4 MG SL tablet Place 0.4 mg under the tongue every 5 (five) minutes x 3 doses as needed for chest pain.     pantoprazole (PROTONIX) 40 MG tablet Take 1 tablet (40 mg total) by mouth 2 (two) times daily before a meal.    ranolazine (RANEXA) 500 MG 12 hr tablet Take 1,000 mg by mouth 2 (two) times daily.    rosuvastatin (CRESTOR) 40 MG tablet Take 40 mg by mouth daily.    sodium bicarbonate 650 MG tablet Take 650 mg by mouth 2 (two) times daily.    sodium fluoride (DENTA 5000 PLUS) 1.1 % CREA dental cream See admin instructions.    Tiotropium Bromide-Olodaterol (STIOLTO RESPIMAT) 2.5-2.5 MCG/ACT AERS Inhale 1 each into the lungs daily.    torsemide 40 MG TABS Take 40 mg by mouth 3 (three) times a week. Take once on MWF    Vitamin D, Ergocalciferol, 50000 units CAPS Take 1 capsule by mouth every 30 (thirty) days.    Zinc Gluconate 50 MG CAPS Take 1 tablet by mouth daily.    [DISCONTINUED] azithromycin (ZITHROMAX) 250 MG tablet Take one tab a day for 10 days for uri    [DISCONTINUED] montelukast (SINGULAIR) 10 MG tablet TAKE 1 TABLET BY MOUTH EVERYDAY AT BEDTIME    [DISCONTINUED] predniSONE (DELTASONE) 10 MG tablet Take one tab 3 x day for 3 days, then take one tab 2 x a day for 3 days and then take one tab a day for 3 days for copd    metoprolol succinate (TOPROL-XL) 50 MG 24 hr tablet Take 1 tablet (50 mg total) by mouth daily. Take with or immediately following a meal. (Patient not taking: Reported on 12/26/2022)    montelukast (SINGULAIR) 10 MG tablet TAKE 1 TABLET BY MOUTH EVERYDAY AT BEDTIME    rosuvastatin (CRESTOR) 10 MG tablet  Take 1 tablet (10 mg total) by mouth at bedtime. (Patient taking differently: Take 40 mg by mouth at bedtime.)    No facility-administered encounter medications on file as of 03/14/2023.    Surgical History: Past Surgical History:  Procedure Laterality Date   CATARACT EXTRACTION  2011, 2012   COLONOSCOPY N/A 12/14/2018   Procedure: COLONOSCOPY;  Surgeon: Pasty Spillers, MD;  Location: [facility] ENDOSCOPY;  Service: Endoscopy;  Laterality: N/A;   CORONARY ANGIOPLASTY WITH STENT PLACEMENT  12/10/2010   CORONARY STENT INTERVENTION N/A 12/29/2019   Procedure: CORONARY STENT INTERVENTION;  Surgeon: End,  Cristal Deer, MD;  Location: [facility] INVASIVE CV LAB;  Service: Cardiovascular;  Laterality: N/A;   ESOPHAGOGASTRODUODENOSCOPY N/A 12/14/2018   Procedure: ESOPHAGOGASTRODUODENOSCOPY (EGD);  Surgeon: Pasty Spillers, MD;  Location: [facility] ENDOSCOPY;  Service: Endoscopy;  Laterality: N/A;   ESOPHAGOGASTRODUODENOSCOPY (EGD) WITH PROPOFOL N/A 06/01/2016   Procedure: ESOPHAGOGASTRODUODENOSCOPY (EGD) WITH PROPOFOL;  Surgeon: Scot Jun, MD;  Location: [facility] ENDOSCOPY;  Service: Endoscopy;  Laterality: N/A;   ESOPHAGOGASTRODUODENOSCOPY (EGD) WITH PROPOFOL N/A 01/31/2023   Procedure: ESOPHAGOGASTRODUODENOSCOPY (EGD) WITH PROPOFOL;  Surgeon: Midge Minium, MD;  Location: [facility] ENDOSCOPY;  Service: Endoscopy;  Laterality: N/A;   FEMORAL ARTERY STENT Right    HERNIA REPAIR     x2   INTRAOCULAR LENS INSERTION     LEFT HEART CATH AND CORONARY ANGIOGRAPHY N/A 05/09/2017   Procedure: Left Heart Cath and Coronary Angiography;  Surgeon: Alwyn Pea, MD;  Location: [facility] INVASIVE CV LAB;  Service: Cardiovascular;  Laterality: N/A;   LEFT HEART CATH AND CORONARY ANGIOGRAPHY N/A 12/08/2018   Procedure: LEFT HEART CATH AND CORONARY ANGIOGRAPHY and possible PCI and stent;  Surgeon: Alwyn Pea, MD;  Location: [facility] INVASIVE CV LAB;  Service: Cardiovascular;  Laterality: N/A;   LEFT HEART CATH AND  CORONARY ANGIOGRAPHY N/A 12/29/2019   Procedure: LEFT HEART CATH AND CORONARY ANGIOGRAPHY;  Surgeon: Yvonne Kendall, MD;  Location: [facility] INVASIVE CV LAB;  Service: Cardiovascular;  Laterality: N/A;   LEFT HEART CATH AND CORONARY ANGIOGRAPHY Left 12/13/2022   Procedure: LEFT HEART CATH AND CORONARY ANGIOGRAPHY;  Surgeon: Alwyn Pea, MD;  Location: [facility] INVASIVE CV LAB;  Service: Cardiovascular;  Laterality: Left;   NOSE SURGERY     submucous resection   PROSTATE SURGERY  12/10/2000   ROTATOR CUFF REPAIR Right     Medical History: Past Medical History:  Diagnosis Date   Anemia    Arthritis    Atrioventricular canal (AVC)    irregular heart beats   Barrett esophagus    Bronchiolitis    Cancer 2002   prostate, esphageal   Chronic diastolic CHF (congestive heart failure)    Colon polyp    Diabetes mellitus without complication    Diverticulosis    Gout    Heart disease    Hemangioma    liver   Hyperlipidemia    Hypertension    Myocardial infarct    Ocular hypertension    Peripheral vascular disease    Skin cancer    Skin melanoma    Sleep apnea    Vitreoretinal degeneration     Family History: Family History  Problem Relation Age of Onset   Arthritis Mother    Stroke Maternal Grandfather    Breast cancer Neg Hx     Social History: Social History   Socioeconomic History   Marital status: Married    Spouse name: Not on file   Number of children: 2   Years of education: Automotive engineer 3 years   Highest education level: Associate degree: occupational, Scientist, product/process development, or vocational program  Occupational History   Occupation: retired  Tobacco Use   Smoking status: Former    Years: 20    Types: Cigarettes    Quit date: 12/10/1976    Years since quitting: 46.3   Smokeless tobacco: Never  Vaping Use   Vaping Use: Never used  Substance and Sexual Activity   Alcohol use: No   Drug use: No   Sexual activity: Not Currently  Other Topics Concern   Not on file  Social  History Narrative  Not on file   Social Determinants of Health   Financial Resource Strain: Low Risk  (09/15/2021)   Overall Financial Resource Strain (CARDIA)    Difficulty of Paying Living Expenses: Not very hard  Food Insecurity: No Food Insecurity (10/15/2022)   Hunger Vital Sign    Worried About Running Out of Food in the Last Year: Never true    Ran Out of Food in the Last Year: Never true  Transportation Needs: No Transportation Needs (10/15/2022)   PRAPARE - Administrator, Civil Service (Medical): No    Lack of Transportation (Non-Medical): No  Physical Activity: Insufficiently Active (09/15/2021)   Exercise Vital Sign    Days of Exercise per Week: 7 days    Minutes of Exercise per Session: 20 min  Stress: No Stress Concern Present (09/15/2021)   [employer] of Occupational Health - Occupational Stress Questionnaire    Feeling of Stress : Not at all  Social Connections: Moderately Isolated (09/15/2021)   Social Connection and Isolation Panel [NHANES]    Frequency of Communication with Friends and Family: Once a week    Frequency of Social Gatherings with Friends and Family: Once a week    Attends Religious Services: More than 4 times per year    Active Member of [employer] or Organizations: No    Attends Banker Meetings: Never    Marital Status: Married  Catering manager Violence: Not At Risk (08/16/2022)   Humiliation, Afraid, Rape, and Kick questionnaire    Fear of Current or Ex-Partner: No    Emotionally Abused: No    Physically Abused: No    Sexually Abused: No    Vital Signs: Blood pressure (!) 128/47, pulse 61, temperature 97.8 F (36.6 C), resp. rate 16, height 5\' 6"  (1.676 m), weight 198 lb (89.8 kg), SpO2 95 %.  Examination: General Appearance: The patient is well-developed, well-nourished, and in no distress. Skin: Gross inspection of skin unremarkable. Head: normocephalic, no gross deformities. Eyes: no gross deformities  noted. ENT: ears appear grossly normal no exudates. Neck: Supple. No thyromegaly. No LAD. Respiratory: Lungs clear to auscultation . Cardiovascular: Normal S1 and S2 without murmur or rub. Extremities: No cyanosis. pulses are equal. Neurologic: Alert and oriented. No involuntary movements.  LABS: Recent Results (from the past 2160 hour(s))  Vitamin B12     Status: None   Collection Time: 12/24/22  2:19 PM  Result Value Ref Range   Vitamin B-12 321 180 - 914 pg/mL    Comment: (NOTE) This assay is not validated for testing neonatal or myeloproliferative syndrome specimens for Vitamin B12 levels. Performed at [facility], [address] [place], [place] [address]   Iron and TIBC     Status: None   Collection Time: 12/24/22  2:20 PM  Result Value Ref Range   Iron 84 45 - 182 ug/dL   TIBC 604 540 - 981 ug/dL   Saturation Ratios 28 17.9 - 39.5 %   UIBC 216 ug/dL    Comment: Performed at [facility], [address] [place], [place] [address]  Ferritin     Status: None   Collection Time: 12/24/22  2:20 PM  Result Value Ref Range   Ferritin 108 24 - 336 ng/mL    Comment: Performed at [facility], [address], [place], [place] [address]  Comprehensive metabolic panel     Status: Abnormal   Collection Time: 12/24/22  2:20 PM  Result Value Ref Range  Sodium 132 (L) 135 - 145 mmol/L   Potassium 4.8 3.5 - 5.1 mmol/L   Chloride 101 98 - 111 mmol/L   CO2 21 (L) 22 - 32 mmol/L   Glucose, Bld 144 (H) 70 - 99 mg/dL    Comment: Glucose reference range applies only to samples taken after fasting for at least 8 hours.   BUN 46 (H) 8 - 23 mg/dL   Creatinine, Ser 4.00 (H) 0.61 - 1.24 mg/dL   Calcium 8.5 (L) 8.9 - 10.3 mg/dL   Total Protein 6.5 6.5 - 8.1 g/dL   Albumin 3.7 3.5 - 5.0 g/dL   AST 16 15 - 41 U/L   ALT 14 0 - 44 U/L   Alkaline Phosphatase 54 38 - 126 U/L   Total Bilirubin 0.3 0.3 - 1.2 mg/dL   GFR, Estimated 24 (L) >60 mL/min    Comment:  (NOTE) Calculated using the CKD-EPI Creatinine Equation (2021)    Anion gap 10 5 - 15    Comment: Performed at [facility], [address] [place], [place] [address]  CBC with Differential/Platelet     Status: Abnormal   Collection Time: 12/24/22  2:20 PM  Result Value Ref Range   WBC 7.6 4.0 - 10.5 K/uL   RBC 3.60 (L) 4.22 - 5.81 MIL/uL   Hemoglobin 11.4 (L) 13.0 - 17.0 g/dL   HCT 95.0 (L) 93.2 - 67.1 %   MCV 96.1 80.0 - 100.0 fL   MCH 31.7 26.0 - 34.0 pg   MCHC 32.9 30.0 - 36.0 g/dL   RDW 24.5 80.9 - 98.3 %   Platelets 223 150 - 400 K/uL   nRBC 0.0 0.0 - 0.2 %   Neutrophils Relative % 69 %   Neutro Abs 5.2 1.7 - 7.7 K/uL   Lymphocytes Relative 13 %   Lymphs Abs 1.0 0.7 - 4.0 K/uL   Monocytes Relative 8 %   Monocytes Absolute 0.6 0.1 - 1.0 K/uL   Eosinophils Relative 7 %   Eosinophils Absolute 0.5 0.0 - 0.5 K/uL   Basophils Relative 1 %   Basophils Absolute 0.1 0.0 - 0.1 K/uL   Immature Granulocytes 2 %   Abs Immature Granulocytes 0.17 (H) 0.00 - 0.07 K/uL    Comment: Performed at [facility], [address] [place], [place] [address]  Glucose, capillary     Status: None   Collection Time: 01/31/23  7:08 AM  Result Value Ref Range   Glucose-Capillary 95 70 - 99 mg/dL    Comment: Glucose reference range applies only to samples taken after fasting for at least 8 hours.    Radiology: No results found.  No results found.  No results found.    Assessment and Plan: Patient Active Problem List   Diagnosis Date Noted   Dysphagia 01/31/2023   Vitamin B12 deficiency 12/26/2022   Type II diabetes mellitus with renal manifestations 08/15/2022   Depression 08/15/2022   Biventricular congestive heart failure 06/28/2021   Urinary tract infection without hematuria 11/01/2020   Dysuria 11/01/2020   Hyposmolality and/or hyponatremia 09/14/2020   Cough 09/13/2020   Bronchitis 07/15/2020   Palpitations 07/15/2020   Aortic atherosclerosis 06/21/2020   Heart  failure 06/21/2020   Otalgia of both ears 05/27/2020   Lobar pneumonia    Type 2 diabetes mellitus with stage 4 chronic kidney disease, without long-term current use of insulin 12/14/2019   Elevated troponin    Acidosis 10/30/2019   Benign hypertensive kidney disease with chronic  kidney disease 10/30/2019   Hyperkalemia 10/30/2019   Secondary hyperparathyroidism of renal origin 10/30/2019   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   Encounter for fitting and adjustment of hearing aid 06/16/2019   Health maintenance alteration 06/16/2019   Prostate cancer 06/16/2019   DM type 2 with diabetic peripheral neuropathy 01/25/2019   CKD (chronic kidney disease) stage 4, GFR 15-29 ml/min 01/21/2019   Gout 01/21/2019   MR (mitral regurgitation) 01/21/2019   Chronic obstructive pulmonary disease 01/08/2019   OSA (obstructive sleep apnea) 01/08/2019   Chronic diastolic heart failure 12/30/2018   Polyp of sigmoid colon    Benign neoplasm of ascending colon    Hemorrhoids without complication    Diverticulosis of large intestine without diverticulitis    Reflux esophagitis    Lymphangiectasia    Arteriovenous malformation of duodenum    Symptomatic anemia    SOB (shortness of breath)    Iron deficiency anemia due to chronic blood loss    Unstable angina    Anemia of chronic renal failure, stage 4 (severe)    Chest pain 11/14/2018   Sciatica 11/12/2018   CAD (coronary artery disease) 02/27/2018   Sacroiliac joint pain 10/08/2017   Pain in limb 09/24/2017   Neuropathy 07/24/2017   NSTEMI (non-ST elevated myocardial infarction) 05/06/2017   PAD (peripheral artery disease) 05/01/2017   History of stroke 01/02/2017   Barrett's esophagus 10/02/2016   Carotid stenosis 10/02/2016   Essential hypertension 10/02/2016   Hyperlipidemia 10/02/2016   Diabetes 10/02/2016   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 Chronic asthma, mild intermittent, uncomplicated PFT stable, continue inhalers as before  2. Acute URI Will go ahead and treat with zpak and prednisone  3. Shortness of breath 6 min walk normal, will treat for acute URI, however SOB likely cardiac in nature given recent health changes and fluid retention. Advised to call cardiology if not improving with treatment or go to ED if new or worsening symptoms arise - 6 minute walk  4. Restrictive lung disease Continue current inhalers  5. Chronic systolic congestive heart failure Followed by cardiology and will follow up sooner if needed   General Counseling: I have discussed the findings of the evaluation and examination with Sharlet Salina.  I have also discussed any further diagnostic evaluation thatmay be needed or ordered today. Edyn verbalizes understanding of the findings of todays visit. We also reviewed his medications today and discussed drug interactions and side effects including but not limited excessive drowsiness and altered mental states. We also discussed that there is always a risk not just to him but also people around him. he has been encouraged to call the office with any questions or concerns that should arise related to todays visit.  Orders Placed This Encounter  Procedures   6 minute walk     Time spent: 40  I have personally obtained a history, examined the patient, evaluated laboratory and imaging results, formulated the assessment and plan and placed orders. This patient was seen by Lynn Ito, PA-C in collaboration with Dr. Freda Munro as a part of collaborative care agreement.     Yevonne Pax, MD [facility] Pulmonary and Critical Care Sleep medicine

## 2023-03-15 ENCOUNTER — Other Ambulatory Visit: Payer: Self-pay

## 2023-03-15 DIAGNOSIS — J4520 Mild intermittent asthma, uncomplicated: Secondary | ICD-10-CM

## 2023-03-15 DIAGNOSIS — J069 Acute upper respiratory infection, unspecified: Secondary | ICD-10-CM

## 2023-03-15 MED ORDER — AZITHROMYCIN 250 MG PO TABS: ORAL_TABLET | ORAL | 0 refills | Status: DC

## 2023-03-15 MED ORDER — PREDNISONE 10 MG PO TABS: ORAL_TABLET | ORAL | 0 refills | Status: DC

## 2023-03-15 NOTE — Telephone Encounter (Signed)
Error

## 2023-03-23 ENCOUNTER — Encounter: Payer: Self-pay | Admitting: Oncology

## 2023-03-26 ENCOUNTER — Encounter: Payer: Self-pay | Admitting: Oncology

## 2023-03-26 DIAGNOSIS — I10 Essential (primary) hypertension: Secondary | ICD-10-CM | POA: Diagnosis not present

## 2023-03-26 DIAGNOSIS — N184 Chronic kidney disease, stage 4 (severe): Secondary | ICD-10-CM | POA: Diagnosis not present

## 2023-03-26 DIAGNOSIS — I2510 Atherosclerotic heart disease of native coronary artery without angina pectoris: Secondary | ICD-10-CM | POA: Diagnosis not present

## 2023-03-26 DIAGNOSIS — I739 Peripheral vascular disease, unspecified: Secondary | ICD-10-CM | POA: Diagnosis not present

## 2023-04-08 DIAGNOSIS — E1129 Type 2 diabetes mellitus with other diabetic kidney complication: Secondary | ICD-10-CM | POA: Diagnosis not present

## 2023-04-08 DIAGNOSIS — I2510 Atherosclerotic heart disease of native coronary artery without angina pectoris: Secondary | ICD-10-CM | POA: Diagnosis not present

## 2023-04-08 DIAGNOSIS — N184 Chronic kidney disease, stage 4 (severe): Secondary | ICD-10-CM | POA: Diagnosis not present

## 2023-04-08 DIAGNOSIS — E1122 Type 2 diabetes mellitus with diabetic chronic kidney disease: Secondary | ICD-10-CM | POA: Diagnosis not present

## 2023-04-08 DIAGNOSIS — E1142 Type 2 diabetes mellitus with diabetic polyneuropathy: Secondary | ICD-10-CM | POA: Diagnosis not present

## 2023-04-08 DIAGNOSIS — Z794 Long term (current) use of insulin: Secondary | ICD-10-CM | POA: Diagnosis not present

## 2023-04-08 DIAGNOSIS — E1159 Type 2 diabetes mellitus with other circulatory complications: Secondary | ICD-10-CM | POA: Diagnosis not present

## 2023-04-08 DIAGNOSIS — R809 Proteinuria, unspecified: Secondary | ICD-10-CM | POA: Diagnosis not present

## 2023-04-16 NOTE — Procedures (Signed)
[facility] [address] [place], [address]    Complete Pulmonary Function Testing Interpretation:  FINDINGS:  Forced vital capacity is normal.  FEV1 is normal.  FEV1 FVC ratio is normal.  Postbronchodilator no significant change in the FEV1 is noted.  Total lung capacity is mildly decreased.  Residual volume is decreased.  Residual insulin capacity ratio was decreased.  FRC is decreased.  DLCO is normal when corrected for alveolar volume.  IMPRESSION:  This spirometry is basically within normal limits the lung volumes were mildly decreased however could be represented by difficulty performing maneuvers clinical correlation is recommended.  Yevonne Pax, MD [facility] Pulmonary Critical Care Medicine Sleep Medicine

## 2023-04-18 ENCOUNTER — Inpatient Hospital Stay
Admission: EM | Admit: 2023-04-18 | Discharge: 2023-04-24 | DRG: 280 | Disposition: A | Discharge status: Home / Self Care | Payer: Medicare Other | Attending: Internal Medicine | Admitting: Internal Medicine

## 2023-04-18 ENCOUNTER — Inpatient Hospital Stay
Admit: 2023-04-18 | Discharge: 2023-04-18 | Disposition: A | Discharge status: Home / Self Care | Payer: Medicare Other | Attending: Cardiology | Admitting: Cardiology

## 2023-04-18 ENCOUNTER — Other Ambulatory Visit: Payer: Self-pay

## 2023-04-18 ENCOUNTER — Encounter: Payer: Self-pay | Admitting: Oncology

## 2023-04-18 ENCOUNTER — Emergency Department: Payer: Medicare Other

## 2023-04-18 ENCOUNTER — Encounter: Payer: Self-pay | Admitting: Family Medicine

## 2023-04-18 DIAGNOSIS — E1151 Type 2 diabetes mellitus with diabetic peripheral angiopathy without gangrene: Secondary | ICD-10-CM | POA: Diagnosis present

## 2023-04-18 DIAGNOSIS — D72829 Elevated white blood cell count, unspecified: Secondary | ICD-10-CM | POA: Diagnosis not present

## 2023-04-18 DIAGNOSIS — R042 Hemoptysis: Secondary | ICD-10-CM | POA: Insufficient documentation

## 2023-04-18 DIAGNOSIS — K2270 Barrett's esophagus without dysplasia: Secondary | ICD-10-CM | POA: Diagnosis present

## 2023-04-18 DIAGNOSIS — J449 Chronic obstructive pulmonary disease, unspecified: Secondary | ICD-10-CM | POA: Diagnosis not present

## 2023-04-18 DIAGNOSIS — Z79899 Other long term (current) drug therapy: Secondary | ICD-10-CM

## 2023-04-18 DIAGNOSIS — E1169 Type 2 diabetes mellitus with other specified complication: Secondary | ICD-10-CM | POA: Diagnosis not present

## 2023-04-18 DIAGNOSIS — I10 Essential (primary) hypertension: Secondary | ICD-10-CM | POA: Diagnosis present

## 2023-04-18 DIAGNOSIS — I480 Paroxysmal atrial fibrillation: Secondary | ICD-10-CM | POA: Insufficient documentation

## 2023-04-18 DIAGNOSIS — I5033 Acute on chronic diastolic (congestive) heart failure: Secondary | ICD-10-CM | POA: Diagnosis present

## 2023-04-18 DIAGNOSIS — G4733 Obstructive sleep apnea (adult) (pediatric): Secondary | ICD-10-CM | POA: Diagnosis present

## 2023-04-18 DIAGNOSIS — I214 Non-ST elevation (NSTEMI) myocardial infarction: Principal | ICD-10-CM | POA: Diagnosis present

## 2023-04-18 DIAGNOSIS — I509 Heart failure, unspecified: Secondary | ICD-10-CM | POA: Diagnosis not present

## 2023-04-18 DIAGNOSIS — Z8611 Personal history of tuberculosis: Secondary | ICD-10-CM

## 2023-04-18 DIAGNOSIS — I350 Nonrheumatic aortic (valve) stenosis: Secondary | ICD-10-CM | POA: Diagnosis not present

## 2023-04-18 DIAGNOSIS — G459 Transient cerebral ischemic attack, unspecified: Secondary | ICD-10-CM | POA: Diagnosis present

## 2023-04-18 DIAGNOSIS — R0789 Other chest pain: Secondary | ICD-10-CM | POA: Diagnosis not present

## 2023-04-18 DIAGNOSIS — E8721 Acute metabolic acidosis: Secondary | ICD-10-CM | POA: Diagnosis not present

## 2023-04-18 DIAGNOSIS — I5032 Chronic diastolic (congestive) heart failure: Secondary | ICD-10-CM | POA: Diagnosis present

## 2023-04-18 DIAGNOSIS — J471 Bronchiectasis with (acute) exacerbation: Secondary | ICD-10-CM | POA: Diagnosis present

## 2023-04-18 DIAGNOSIS — Z85820 Personal history of malignant melanoma of skin: Secondary | ICD-10-CM

## 2023-04-18 DIAGNOSIS — Z7902 Long term (current) use of antithrombotics/antiplatelets: Secondary | ICD-10-CM

## 2023-04-18 DIAGNOSIS — Z886 Allergy status to analgesic agent status: Secondary | ICD-10-CM

## 2023-04-18 DIAGNOSIS — Z7984 Long term (current) use of oral hypoglycemic drugs: Secondary | ICD-10-CM

## 2023-04-18 DIAGNOSIS — I2510 Atherosclerotic heart disease of native coronary artery without angina pectoris: Secondary | ICD-10-CM | POA: Diagnosis present

## 2023-04-18 DIAGNOSIS — Z6833 Body mass index (BMI) 33.0-33.9, adult: Secondary | ICD-10-CM

## 2023-04-18 DIAGNOSIS — Z7951 Long term (current) use of inhaled steroids: Secondary | ICD-10-CM

## 2023-04-18 DIAGNOSIS — I959 Hypotension, unspecified: Secondary | ICD-10-CM | POA: Diagnosis present

## 2023-04-18 DIAGNOSIS — Z888 Allergy status to other drugs, medicaments and biological substances status: Secondary | ICD-10-CM

## 2023-04-18 DIAGNOSIS — M109 Gout, unspecified: Secondary | ICD-10-CM | POA: Diagnosis not present

## 2023-04-18 DIAGNOSIS — E669 Obesity, unspecified: Secondary | ICD-10-CM | POA: Diagnosis not present

## 2023-04-18 DIAGNOSIS — Z85828 Personal history of other malignant neoplasm of skin: Secondary | ICD-10-CM

## 2023-04-18 DIAGNOSIS — E872 Acidosis, unspecified: Secondary | ICD-10-CM | POA: Diagnosis present

## 2023-04-18 DIAGNOSIS — N179 Acute kidney failure, unspecified: Secondary | ICD-10-CM

## 2023-04-18 DIAGNOSIS — I130 Hypertensive heart and chronic kidney disease with heart failure and stage 1 through stage 4 chronic kidney disease, or unspecified chronic kidney disease: Secondary | ICD-10-CM | POA: Diagnosis present

## 2023-04-18 DIAGNOSIS — R079 Chest pain, unspecified: Secondary | ICD-10-CM | POA: Diagnosis not present

## 2023-04-18 DIAGNOSIS — E785 Hyperlipidemia, unspecified: Secondary | ICD-10-CM | POA: Diagnosis not present

## 2023-04-18 DIAGNOSIS — D631 Anemia in chronic kidney disease: Secondary | ICD-10-CM | POA: Diagnosis not present

## 2023-04-18 DIAGNOSIS — Z881 Allergy status to other antibiotic agents status: Secondary | ICD-10-CM

## 2023-04-18 DIAGNOSIS — Z87891 Personal history of nicotine dependence: Secondary | ICD-10-CM

## 2023-04-18 DIAGNOSIS — Z91010 Allergy to peanuts: Secondary | ICD-10-CM

## 2023-04-18 DIAGNOSIS — E1165 Type 2 diabetes mellitus with hyperglycemia: Secondary | ICD-10-CM | POA: Diagnosis not present

## 2023-04-18 DIAGNOSIS — J439 Emphysema, unspecified: Secondary | ICD-10-CM | POA: Diagnosis not present

## 2023-04-18 DIAGNOSIS — N184 Chronic kidney disease, stage 4 (severe): Secondary | ICD-10-CM | POA: Diagnosis not present

## 2023-04-18 DIAGNOSIS — Z887 Allergy status to serum and vaccine status: Secondary | ICD-10-CM

## 2023-04-18 DIAGNOSIS — N2581 Secondary hyperparathyroidism of renal origin: Secondary | ICD-10-CM | POA: Diagnosis not present

## 2023-04-18 DIAGNOSIS — Z880 Allergy status to penicillin: Secondary | ICD-10-CM

## 2023-04-18 DIAGNOSIS — F439 Reaction to severe stress, unspecified: Secondary | ICD-10-CM | POA: Diagnosis present

## 2023-04-18 DIAGNOSIS — Z823 Family history of stroke: Secondary | ICD-10-CM

## 2023-04-18 DIAGNOSIS — N170 Acute kidney failure with tubular necrosis: Secondary | ICD-10-CM | POA: Diagnosis not present

## 2023-04-18 DIAGNOSIS — Z794 Long term (current) use of insulin: Secondary | ICD-10-CM

## 2023-04-18 DIAGNOSIS — Z955 Presence of coronary angioplasty implant and graft: Secondary | ICD-10-CM

## 2023-04-18 DIAGNOSIS — Z86010 Personal history of colonic polyps: Secondary | ICD-10-CM

## 2023-04-18 DIAGNOSIS — E1122 Type 2 diabetes mellitus with diabetic chronic kidney disease: Secondary | ICD-10-CM | POA: Diagnosis not present

## 2023-04-18 DIAGNOSIS — Z8546 Personal history of malignant neoplasm of prostate: Secondary | ICD-10-CM

## 2023-04-18 DIAGNOSIS — E1129 Type 2 diabetes mellitus with other diabetic kidney complication: Secondary | ICD-10-CM | POA: Diagnosis present

## 2023-04-18 DIAGNOSIS — I129 Hypertensive chronic kidney disease with stage 1 through stage 4 chronic kidney disease, or unspecified chronic kidney disease: Secondary | ICD-10-CM | POA: Diagnosis not present

## 2023-04-18 DIAGNOSIS — R0902 Hypoxemia: Secondary | ICD-10-CM | POA: Diagnosis not present

## 2023-04-18 DIAGNOSIS — Z8261 Family history of arthritis: Secondary | ICD-10-CM

## 2023-04-18 DIAGNOSIS — Z7982 Long term (current) use of aspirin: Secondary | ICD-10-CM

## 2023-04-18 DIAGNOSIS — I459 Conduction disorder, unspecified: Secondary | ICD-10-CM | POA: Diagnosis present

## 2023-04-18 DIAGNOSIS — I4891 Unspecified atrial fibrillation: Secondary | ICD-10-CM | POA: Diagnosis not present

## 2023-04-18 DIAGNOSIS — Z91030 Bee allergy status: Secondary | ICD-10-CM

## 2023-04-18 DIAGNOSIS — M1990 Unspecified osteoarthritis, unspecified site: Secondary | ICD-10-CM | POA: Diagnosis present

## 2023-04-18 DIAGNOSIS — E8722 Chronic metabolic acidosis: Secondary | ICD-10-CM | POA: Diagnosis not present

## 2023-04-18 DIAGNOSIS — R609 Edema, unspecified: Secondary | ICD-10-CM | POA: Diagnosis not present

## 2023-04-18 DIAGNOSIS — I2119 ST elevation (STEMI) myocardial infarction involving other coronary artery of inferior wall: Secondary | ICD-10-CM | POA: Diagnosis not present

## 2023-04-18 DIAGNOSIS — J479 Bronchiectasis, uncomplicated: Secondary | ICD-10-CM

## 2023-04-18 DIAGNOSIS — Z8673 Personal history of transient ischemic attack (TIA), and cerebral infarction without residual deficits: Secondary | ICD-10-CM

## 2023-04-18 DIAGNOSIS — I252 Old myocardial infarction: Secondary | ICD-10-CM

## 2023-04-18 LAB — CBC WITH DIFFERENTIAL/PLATELET
Abs Immature Granulocytes: 0.16 10*3/uL — ABNORMAL HIGH (ref 0.00–0.07)
Basophils Absolute: 0.1 10*3/uL (ref 0.0–0.1)
Basophils Relative: 0 %
Eosinophils Absolute: 0.3 10*3/uL (ref 0.0–0.5)
Eosinophils Relative: 1 %
HCT: 31.9 % — ABNORMAL LOW (ref 39.0–52.0)
Hemoglobin: 10.2 g/dL — ABNORMAL LOW (ref 13.0–17.0)
Immature Granulocytes: 1 %
Lymphocytes Relative: 4 %
Lymphs Abs: 0.7 10*3/uL (ref 0.7–4.0)
MCH: 31.6 pg (ref 26.0–34.0)
MCHC: 32 g/dL (ref 30.0–36.0)
MCV: 98.8 fL (ref 80.0–100.0)
Monocytes Absolute: 1.5 10*3/uL — ABNORMAL HIGH (ref 0.1–1.0)
Monocytes Relative: 8 %
Neutro Abs: 15.5 10*3/uL — ABNORMAL HIGH (ref 1.7–7.7)
Neutrophils Relative %: 86 %
Platelets: 229 10*3/uL (ref 150–400)
RBC: 3.23 MIL/uL — ABNORMAL LOW (ref 4.22–5.81)
RDW: 15 % (ref 11.5–15.5)
WBC: 18.2 10*3/uL — ABNORMAL HIGH (ref 4.0–10.5)
nRBC: 0 % (ref 0.0–0.2)

## 2023-04-18 LAB — CBG MONITORING, ED
Glucose-Capillary: 270 mg/dL — ABNORMAL HIGH (ref 70–99)
Glucose-Capillary: 209 mg/dL — ABNORMAL HIGH (ref 70–99)
Glucose-Capillary: 189 mg/dL — ABNORMAL HIGH (ref 70–99)
Glucose-Capillary: 207 mg/dL — ABNORMAL HIGH (ref 70–99)

## 2023-04-18 LAB — URINALYSIS, W/ REFLEX TO CULTURE (INFECTION SUSPECTED)
Bacteria, UA: NONE SEEN
Bilirubin Urine: NEGATIVE
Glucose, UA: >=500 mg/dL — AB
Hgb urine dipstick: NEGATIVE
Ketones, ur: NEGATIVE mg/dL
Leukocytes,Ua: NEGATIVE
Nitrite: NEGATIVE
Protein, ur: NEGATIVE mg/dL
Specific Gravity, Urine: 1.008 (ref 1.005–1.030)
Squamous Epithelial / HPF: NONE SEEN /HPF (ref 0–5)
WBC, UA: NONE SEEN WBC/hpf (ref 0–5)
pH: 5 (ref 5.0–8.0)

## 2023-04-18 LAB — ECHOCARDIOGRAM COMPLETE
AR max vel: 1.25 cm2
AV Area VTI: 1.27 cm2
AV Area mean vel: 1.24 cm2
AV Mean grad: 22 mmHg
AV Peak grad: 39.4 mmHg
Ao pk vel: 3.14 m/s
Area-P 1/2: 3.15 cm2
MV VTI: 2.39 cm2
S' Lateral: 2.7 cm
Weight: 3354.52 oz

## 2023-04-18 LAB — BRAIN NATRIURETIC PEPTIDE: B Natriuretic Peptide: 283.4 pg/mL — ABNORMAL HIGH (ref 0.0–100.0)

## 2023-04-18 LAB — TROPONIN I (HIGH SENSITIVITY)
Troponin I (High Sensitivity): 34 ng/L — ABNORMAL HIGH (ref <18)
Troponin I (High Sensitivity): 90 ng/L — ABNORMAL HIGH (ref <18)
Troponin I (High Sensitivity): 454 ng/L (ref <18)
Troponin I (High Sensitivity): 801 ng/L (ref <18)

## 2023-04-18 LAB — HEPARIN LEVEL (UNFRACTIONATED): Heparin Unfractionated: 0.16 IU/mL — ABNORMAL LOW (ref 0.30–0.70)

## 2023-04-18 LAB — COMPREHENSIVE METABOLIC PANEL
ALT: 13 U/L (ref 0–44)
AST: 19 U/L (ref 15–41)
Albumin: 3.5 g/dL (ref 3.5–5.0)
Alkaline Phosphatase: 49 U/L (ref 38–126)
Anion gap: 14 (ref 5–15)
BUN: 78 mg/dL — ABNORMAL HIGH (ref 8–23)
CO2: 21 mmol/L — ABNORMAL LOW (ref 22–32)
Calcium: 8.3 mg/dL — ABNORMAL LOW (ref 8.9–10.3)
Chloride: 95 mmol/L — ABNORMAL LOW (ref 98–111)
Creatinine, Ser: 3.74 mg/dL — ABNORMAL HIGH (ref 0.61–1.24)
GFR, Estimated: 15 mL/min — ABNORMAL LOW (ref >60)
Glucose, Bld: 263 mg/dL — ABNORMAL HIGH (ref 70–99)
Potassium: 4.2 mmol/L (ref 3.5–5.1)
Sodium: 130 mmol/L — ABNORMAL LOW (ref 135–145)
Total Bilirubin: 1 mg/dL (ref 0.3–1.2)
Total Protein: 6.2 g/dL — ABNORMAL LOW (ref 6.5–8.1)

## 2023-04-18 LAB — APTT: aPTT: 34 seconds (ref 24–36)

## 2023-04-18 LAB — PROTIME-INR
INR: 1.2 (ref 0.8–1.2)
Prothrombin Time: 15.2 seconds (ref 11.4–15.2)

## 2023-04-18 LAB — MAGNESIUM: Magnesium: 2.2 mg/dL (ref 1.7–2.4)

## 2023-04-18 MED ORDER — INSULIN ASPART 100 UNIT/ML IJ SOLN
0.0000 [IU] | Freq: Every day | INTRAMUSCULAR | Status: DC
  Administered 2023-04-19: 2 [IU] via SUBCUTANEOUS
  Administered 2023-04-20: 3 [IU] via SUBCUTANEOUS
  Administered 2023-04-21 – 2023-04-23 (×3): 2 [IU] via SUBCUTANEOUS
  Filled 2023-04-18 (×5): qty 1

## 2023-04-18 MED ORDER — HEPARIN BOLUS VIA INFUSION
2400.0000 [IU] | Freq: Once | INTRAVENOUS | Status: AC
  Administered 2023-04-18: 2400 [IU] via INTRAVENOUS
  Filled 2023-04-18: qty 2400

## 2023-04-18 MED ORDER — METOPROLOL TARTRATE 50 MG PO TABS: 50.0000 mg | ORAL_TABLET | Freq: Two times a day (BID) | ORAL | Status: DC

## 2023-04-18 MED ORDER — ALLOPURINOL 100 MG PO TABS
100.0000 mg | ORAL_TABLET | Freq: Every day | ORAL | Status: DC
  Administered 2023-04-18 – 2023-04-24 (×7): 100 mg via ORAL
  Filled 2023-04-18 (×7): qty 1

## 2023-04-18 MED ORDER — NITROGLYCERIN 0.4 MG SL SUBL
0.4000 mg | SUBLINGUAL_TABLET | SUBLINGUAL | Status: DC | PRN
  Administered 2023-04-19 (×3): 0.4 mg via SUBLINGUAL
  Filled 2023-04-18: qty 1

## 2023-04-18 MED ORDER — FUROSEMIDE 10 MG/ML IJ SOLN
40.0000 mg | Freq: Once | INTRAMUSCULAR | Status: AC
  Administered 2023-04-18: 40 mg via INTRAVENOUS
  Filled 2023-04-18: qty 4

## 2023-04-18 MED ORDER — NITROGLYCERIN 2 % TD OINT
1.0000 [in_us] | TOPICAL_OINTMENT | Freq: Four times a day (QID) | TRANSDERMAL | Status: DC
  Administered 2023-04-18 – 2023-04-19 (×4): 1 [in_us] via TOPICAL
  Filled 2023-04-18 (×4): qty 1

## 2023-04-18 MED ORDER — CITALOPRAM HYDROBROMIDE 20 MG PO TABS
10.0000 mg | ORAL_TABLET | Freq: Every day | ORAL | Status: DC
  Administered 2023-04-18 – 2023-04-24 (×7): 10 mg via ORAL
  Filled 2023-04-18 (×7): qty 1

## 2023-04-18 MED ORDER — SODIUM BICARBONATE 650 MG PO TABS
650.0000 mg | ORAL_TABLET | Freq: Two times a day (BID) | ORAL | Status: DC
  Administered 2023-04-18 – 2023-04-24 (×13): 650 mg via ORAL
  Filled 2023-04-18 (×13): qty 1

## 2023-04-18 MED ORDER — ONDANSETRON HCL 4 MG/2ML IJ SOLN
4.0000 mg | Freq: Once | INTRAMUSCULAR | Status: AC
  Administered 2023-04-18: 4 mg via INTRAVENOUS
  Filled 2023-04-18: qty 2

## 2023-04-18 MED ORDER — HEPARIN BOLUS VIA INFUSION
4000.0000 [IU] | Freq: Once | INTRAVENOUS | Status: AC
  Administered 2023-04-18: 4000 [IU] via INTRAVENOUS
  Filled 2023-04-18: qty 4000

## 2023-04-18 MED ORDER — ROSUVASTATIN CALCIUM 20 MG PO TABS
40.0000 mg | ORAL_TABLET | Freq: Every day | ORAL | Status: DC
  Administered 2023-04-18 – 2023-04-19 (×2): 40 mg via ORAL
  Filled 2023-04-18 (×2): qty 2

## 2023-04-18 MED ORDER — METOPROLOL TARTRATE 25 MG PO TABS
12.5000 mg | ORAL_TABLET | Freq: Two times a day (BID) | ORAL | Status: DC
  Administered 2023-04-18 – 2023-04-20 (×4): 12.5 mg via ORAL
  Filled 2023-04-18 (×5): qty 1

## 2023-04-18 MED ORDER — ONDANSETRON HCL 4 MG/2ML IJ SOLN: 4.0000 mg | Freq: Four times a day (QID) | INTRAMUSCULAR | Status: DC | PRN

## 2023-04-18 MED ORDER — ARFORMOTEROL TARTRATE 15 MCG/2ML IN NEBU
15.0000 ug | INHALATION_SOLUTION | Freq: Two times a day (BID) | RESPIRATORY_TRACT | Status: DC
  Filled 2023-04-18: qty 2

## 2023-04-18 MED ORDER — FENTANYL CITRATE PF 50 MCG/ML IJ SOSY
50.0000 ug | PREFILLED_SYRINGE | Freq: Once | INTRAMUSCULAR | Status: AC
  Administered 2023-04-18: 50 ug via INTRAVENOUS
  Filled 2023-04-18: qty 1

## 2023-04-18 MED ORDER — MORPHINE SULFATE (PF) 4 MG/ML IV SOLN
4.0000 mg | Freq: Once | INTRAVENOUS | Status: AC
  Administered 2023-04-18: 4 mg via INTRAVENOUS
  Filled 2023-04-18: qty 1

## 2023-04-18 MED ORDER — ACETAMINOPHEN 325 MG PO TABS: 650.0000 mg | ORAL_TABLET | Freq: Four times a day (QID) | ORAL | Status: DC | PRN

## 2023-04-18 MED ORDER — ONDANSETRON HCL 4 MG PO TABS: 4.0000 mg | ORAL_TABLET | Freq: Four times a day (QID) | ORAL | Status: DC | PRN

## 2023-04-18 MED ORDER — UMECLIDINIUM BROMIDE 62.5 MCG/ACT IN AEPB: 1.0000 | INHALATION_SPRAY | Freq: Every day | RESPIRATORY_TRACT | Status: DC

## 2023-04-18 MED ORDER — UMECLIDINIUM BROMIDE 62.5 MCG/ACT IN AEPB
1.0000 | INHALATION_SPRAY | Freq: Every day | RESPIRATORY_TRACT | Status: DC
  Filled 2023-04-18: qty 7

## 2023-04-18 MED ORDER — INSULIN GLARGINE-YFGN 100 UNIT/ML ~~LOC~~ SOLN
25.0000 [IU] | Freq: Every day | SUBCUTANEOUS | Status: DC
  Administered 2023-04-18: 25 [IU] via SUBCUTANEOUS
  Filled 2023-04-18: qty 0.25

## 2023-04-18 MED ORDER — HEPARIN (PORCINE) 25000 UT/250ML-% IV SOLN
1450.0000 [IU]/h | INTRAVENOUS | Status: DC
  Administered 2023-04-18: 1000 [IU]/h via INTRAVENOUS
  Administered 2023-04-18: 1250 [IU]/h via INTRAVENOUS
  Administered 2023-04-19: 1450 [IU]/h via INTRAVENOUS
  Filled 2023-04-18 (×3): qty 250

## 2023-04-18 MED ORDER — CLOPIDOGREL BISULFATE 75 MG PO TABS
75.0000 mg | ORAL_TABLET | Freq: Every day | ORAL | Status: DC
  Administered 2023-04-19 – 2023-04-22 (×4): 75 mg via ORAL
  Filled 2023-04-18 (×4): qty 1

## 2023-04-18 MED ORDER — ASPIRIN 81 MG PO TBEC
81.0000 mg | DELAYED_RELEASE_TABLET | Freq: Every day | ORAL | Status: DC
  Administered 2023-04-19 – 2023-04-22 (×4): 81 mg via ORAL
  Filled 2023-04-18 (×4): qty 1

## 2023-04-18 MED ORDER — RANOLAZINE ER 500 MG PO TB12
1000.0000 mg | ORAL_TABLET | Freq: Two times a day (BID) | ORAL | Status: DC
  Administered 2023-04-19: 1000 mg via ORAL
  Filled 2023-04-18: qty 2

## 2023-04-18 MED ORDER — INSULIN ASPART 100 UNIT/ML IJ SOLN
0.0000 [IU] | Freq: Three times a day (TID) | INTRAMUSCULAR | Status: DC
  Administered 2023-04-18: 3 [IU] via SUBCUTANEOUS
  Administered 2023-04-18: 5 [IU] via SUBCUTANEOUS
  Administered 2023-04-19: 7 [IU] via SUBCUTANEOUS
  Administered 2023-04-19: 3 [IU] via SUBCUTANEOUS
  Administered 2023-04-19: 5 [IU] via SUBCUTANEOUS
  Administered 2023-04-20 (×2): 3 [IU] via SUBCUTANEOUS
  Administered 2023-04-20: 2 [IU] via SUBCUTANEOUS
  Administered 2023-04-21: 7 [IU] via SUBCUTANEOUS
  Administered 2023-04-21: 2 [IU] via SUBCUTANEOUS
  Administered 2023-04-21: 3 [IU] via SUBCUTANEOUS
  Administered 2023-04-22: 2 [IU] via SUBCUTANEOUS
  Administered 2023-04-22: 5 [IU] via SUBCUTANEOUS
  Administered 2023-04-22: 2 [IU] via SUBCUTANEOUS
  Administered 2023-04-23: 3 [IU] via SUBCUTANEOUS
  Administered 2023-04-23 (×2): 2 [IU] via SUBCUTANEOUS
  Administered 2023-04-24: 5 [IU] via SUBCUTANEOUS
  Administered 2023-04-24: 3 [IU] via SUBCUTANEOUS
  Filled 2023-04-18 (×10): qty 1

## 2023-04-18 MED ORDER — ISOSORBIDE MONONITRATE ER 60 MG PO TB24
90.0000 mg | ORAL_TABLET | Freq: Every day | ORAL | Status: DC
  Administered 2023-04-18: 90 mg via ORAL
  Filled 2023-04-18: qty 2

## 2023-04-18 NOTE — Assessment & Plan Note (Addendum)
White count 18.2 on presentation ?  Secondary to NSTEMI stress reaction  No overt source of infection at present Chest x-ray stable Urinalysis pending Panculture Patient afebrile Will otherwise monitor for now Reevaluate if patient declines from a clinical standpoint

## 2023-04-18 NOTE — ED Provider Notes (Addendum)
7:31 AM Assumed care for off going team.   Blood pressure (!) 116/55, pulse 66, temperature 98.2 F (36.8 C), temperature source Oral, resp. rate 15, weight 95.1 kg, SpO2 95 %.  See their HPI for full report but in brief pending repeat trop.  Trop 34---90   Repeat EKG my interpretation is sinus rate of 67 without any ST elevation or T wave inversions other than lead III, normal intervals.  7:41 AM repeat evaluated patient.  He reports feeling better.  Pain is come down significantly still reports some moderate pain.  Will give some morphine, Zofran to help with the rest of his additional pain.  His troponin is rising from 34-90.  Repeat EKG without evidence of STEMI development.  Will place patient on heparin has already been loaded with aspirin.  He reports that his cardiologist is Dr. Juliann Pares so I sent a message to the cardiology team so they can consult on for patient I will discuss with hospital team for admission  Prior to starting heparin patient denies any issues with bleeding denies any falls or hitting his head.  Hemoglobin is 10.2 suspect dilution from CHF and platelets are reassuring    .Critical Care  Performed by: Concha Se, MD Authorized by: Concha Se, MD   Critical care provider statement:    Critical care time (minutes):  30   Critical care was necessary to treat or prevent imminent or life-threatening deterioration of the following conditions:  Cardiac failure   Critical care was time spent personally by me on the following activities:  Development of treatment plan with patient or surrogate, discussions with consultants, evaluation of patient's response to treatment, examination of patient, ordering and review of laboratory studies, ordering and review of radiographic studies, ordering and performing treatments and interventions, pulse oximetry, re-evaluation of patient's condition and review of old charts  Discussed with Dr Alvester Morin for admission.      Concha Se, MD 04/18/23 1610    Concha Se, MD 04/18/23 [phone]

## 2023-04-18 NOTE — ED Notes (Signed)
This RN received a critical value on pts repeat trop, result of 90, Dr. Fuller Plan aware, new EKG obtained, see new orders.

## 2023-04-18 NOTE — Assessment & Plan Note (Addendum)
Baseline history of CAD with unstable angina, S/p PCI/stent to proximal RCA and proximal circumflex Noted cardiac cath 12/2022 showing multivessel disease  Typical 10 out of 10 chest pain that woke him up this morning with radiation up the neck and down the left arm. Troponin 30-->90, though AoCKD stage 4 may cause poor renal clearance of cardiac enzymes.  EKG normal sinus rhythm with nonspecific intraventricular conduction delay Heart score 8+  Status post full dose aspirin Started on a heparin drip Dr. Darrold Junker w/ cardiology consulted for evaluation  Trend troponin

## 2023-04-18 NOTE — ED Notes (Signed)
Report given to Marcel RN

## 2023-04-18 NOTE — ED Triage Notes (Signed)
Per Ems pt called from home for CP, pt took 324mg  ASA and 3 SL nitro's prior to EMS arrival. Per EMS pts sats were 92% at home. Pt sts current CP is 8/10.

## 2023-04-18 NOTE — ED Notes (Signed)
Echo at bedside

## 2023-04-18 NOTE — Assessment & Plan Note (Signed)
2D echo September 2023 with EF of 50 to 55% Appears euvolemic-mildly volume overloaded  at present BP stable with normal chest x-ray Monitor volume status

## 2023-04-18 NOTE — Assessment & Plan Note (Signed)
Hemoglobin 10.2 today which appears to be near baseline Continue home regimen Follow

## 2023-04-18 NOTE — ED Notes (Signed)
ED Provider at bedside. 

## 2023-04-18 NOTE — ED Notes (Signed)
Pt attempted to use the urinal in bed and had an accident. Pt's linens were changed and pt was cleaned up and changed in to a hospital gown.

## 2023-04-18 NOTE — ED Provider Notes (Signed)
[facility] Provider Note    Event Date/Time   First MD Initiated Contact with Patient 04/18/23 [phone]     (approximate)   History   Chest Pain   HPI  Mike Decker is a 82 y.o. male who presents to the ED for evaluation of Chest Pain   I reviewed cardiology clinic visit from 4/16 days. History of CAD with multiple stents placed.  And a left heart cath 12/13/22 when he was admitted for an NSTEMI with multivessel disease managed medically. Moderate AS. During this clinic visit patient was reporting his diastolic BP being fairly low so his Imdur was decreased as well as his Lasix.  DAPT with Plavix but no AC.  Patient reports past few weeks he has noted some mild increased lower extremity swelling and orthopnea but he minimizes this.  Reports awakening from sleep at 3 AM with severe crushing 10/10 chest pain with nitroglycerin and 324 ASA.  Reports feeling "normal" before bed last night.   Physical Exam   Triage Vital Signs: ED Triage Vitals [04/18/23 0436]  Enc Vitals Group     BP      Pulse      Resp      Temp      Temp src      SpO2      Weight      Height      Head Circumference      Peak Flow      Pain Score 8     Pain Loc      Pain Edu?      Excl. in GC?     Most recent vital signs: Vitals:   04/18/23 0530 04/18/23 0600  BP: (!) 105/44 116/70  Pulse: 67 68  Resp: 17 19  Temp:    SpO2: 97% 96%    General: Awake, no distress.  Seems uncomfortable CV:  Good peripheral perfusion.  Resp:  Normal effort.  No wheezing Abd:  No distention.  Soft and benign MSK:  No deformity noted.  Minimal swelling bilaterally Neuro:  No focal deficits appreciated. Other:     ED Results / Procedures / Treatments   Labs (all labs ordered are listed, but only abnormal results are displayed) Labs Reviewed  COMPREHENSIVE METABOLIC PANEL - Abnormal; Notable for the following components:      Result Value   Sodium 130 (*)    Chloride 95 (*)     CO2 21 (*)    Glucose, Bld 263 (*)    BUN 78 (*)    Creatinine, Ser 3.74 (*)    Calcium 8.3 (*)    Total Protein 6.2 (*)    GFR, Estimated 15 (*)    All other components within normal limits  CBC WITH DIFFERENTIAL/PLATELET - Abnormal; Notable for the following components:   WBC 18.2 (*)    RBC 3.23 (*)    Hemoglobin 10.2 (*)    HCT 31.9 (*)    Neutro Abs 15.5 (*)    Monocytes Absolute 1.5 (*)    Abs Immature Granulocytes 0.16 (*)    All other components within normal limits  BRAIN NATRIURETIC PEPTIDE - Abnormal; Notable for the following components:   B Natriuretic Peptide 283.4 (*)    All other components within normal limits  TROPONIN I (HIGH SENSITIVITY) - Abnormal; Notable for the following components:   Troponin I (High Sensitivity) 34 (*)    All other components within normal limits  MAGNESIUM  URINALYSIS,  ROUTINE W REFLEX MICROSCOPIC  TROPONIN I (HIGH SENSITIVITY)    EKG Sinus rhythm with a rate of 75 bpm.  Normal axis.  Incomplete bundle.  No clear STEMI.    RADIOLOGY 1 view CXR interpreted by me with pulm vascular congestion.  Official radiology report(s): DG Chest Portable 1 View  Result Date: 04/18/2023 CLINICAL DATA:  Chest pain EXAM: PORTABLE CHEST 1 VIEW COMPARISON:  12/12/2022 FINDINGS: Borderline heart size that is stable. Mild interstitial coarsening without Kerley line or air bronchogram. No effusion or pneumothorax. Artifact from EKG leads. IMPRESSION: No edema or focal pneumonia. Electronically Signed   By: Tiburcio Pea M.D.   On: 04/18/2023 05:15    PROCEDURES and INTERVENTIONS:  .1-3 Lead EKG Interpretation  Performed by: Delton Prairie, MD Authorized by: Delton Prairie, MD     Interpretation: normal     ECG rate:  70   ECG rate assessment: normal     Rhythm: sinus rhythm     Ectopy: none     Conduction: normal     Medications  nitroGLYCERIN (NITROSTAT) SL tablet 0.4 mg (has no administration in time range)  fentaNYL (SUBLIMAZE) injection  50 mcg (has no administration in time range)  furosemide (LASIX) injection 40 mg (has no administration in time range)  fentaNYL (SUBLIMAZE) injection 50 mcg (50 mcg Intravenous Given 04/18/23 0450)     IMPRESSION / MDM / ASSESSMENT AND PLAN / ED COURSE  I reviewed the triage vital signs and the nursing notes.  Differential diagnosis includes, but is not limited to, ACS, PTX, PNA, muscle strain/spasm, PE, dissection, anxiety, pleural effusion  {Patient presents with symptoms of an acute illness or injury that is potentially life-threatening.  82 year old male presents to the ED with sudden chest pain superimposed on subacute orthopnea and signs of mild CHF exacerbation.  EKG is nonischemic and first troponin is chronically mildly elevated.  BNP is elevated and concerned about possible mild CHF exacerbation.  Blood work with worsening of his renal function with an AKI on CKD, possibly cardiorenal in etiology considering his decreasing Lasix and orthopnea recently.  We will start gentle diuresis with a small dose of IV Lasix.  Leukocytosis is noted but he denies any preceding symptoms to suggest infectious etiology of this.  We will add a urinalysis to his workup.  He is a benign abdomen.  I suspect this patient will require medical admission.  I had wanted to do a CTA chest to evaluate for PE but his renal function precludes this.  Clinical Course as of 04/18/23 0634  Thu Apr 18, 2023  [id number] Reassessed.  Pain improved and now more moderate 5/10 intensity.  We discussed plan of care [DS]    Clinical Course User Index [DS] Delton Prairie, MD     FINAL CLINICAL IMPRESSION(S) / ED DIAGNOSES   Final diagnoses:  Other chest pain     Rx / DC Orders   ED Discharge Orders     None        Note:  This document was prepared using Dragon voice recognition software and may include unintentional dictation errors.   Delton Prairie, MD 04/18/23 [phone]

## 2023-04-18 NOTE — Progress Notes (Signed)
ANTICOAGULATION CONSULT NOTE  Pharmacy Consult for heparin infusion Indication: chest pain/ACS  Allergies  Allergen Reactions   Gabapentin     Other reaction(s): Other (See Comments) Tremors   Peanut-Containing Drug Products Anaphylaxis   Penicillins Hives and Rash    Has patient had a PCN reaction causing immediate rash, facial/tongue/throat swelling, SOB or lightheadedness with hypotension: Yes Has patient had a PCN reaction causing severe rash involving mucus membranes or skin necrosis: No Has patient had a PCN reaction that required hospitalization No Has patient had a PCN reaction occurring within the last 10 years: No If all of the above answers are "NO", then may proceed with Cephalosporin use.   Bee Venom Swelling   Influenza Vaccines Hives   Inh [Isoniazid] Hives   Jardiance [Empagliflozin] Other (See Comments)    Diarrhea   Kenalog [Triamcinolone Acetonide] Hives   Levaquin [Levofloxacin] Other (See Comments)    Tendon, ligament pain.    Lisinopril     Other reaction(s): Hyperkalemia   Nalfon [Fenoprofen Calcium] Hives   Naproxen    Peanut Oil    Nsaids Rash    Nalfon 600 Nalfon 600    Patient Measurements: Weight: 95.1 kg (209 lb 10.5 oz) Heparin Dosing Weight: 82.7 kg  Vital Signs: Temp: 98.2 F (36.8 C) (05/09 0441) Temp Source: Oral (05/09 0441) BP: 116/55 (05/09 0700) Pulse Rate: 65 (05/09 0720)  Labs: Recent Labs    04/18/23 0449 04/18/23 0634  HGB 10.2*  --   HCT 31.9*  --   PLT 229  --   CREATININE 3.74*  --   TROPONINIHS 34* 90*    Estimated Creatinine Clearance: 16.4 mL/min (A) (by C-G formula based on SCr of 3.74 mg/dL (H)).   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 Pt not on anticoagulants per current med list   Goal of Therapy:  Heparin level 0.3-0.7 units/ml Monitor platelets by anticoagulation protocol: Yes   Plan:  Give 4000 units bolus x 1 Start heparin infusion at 1000 units/hr Check anti-Xa level in 8 hours and daily while on heparin Continue to monitor H&H and platelets  Lowella Bandy 04/18/2023,7:48 AM

## 2023-04-18 NOTE — Assessment & Plan Note (Signed)
Stable from a resp standpoint  Cont home inhalers   

## 2023-04-18 NOTE — Assessment & Plan Note (Signed)
Cont antiplatelet regimen and high dose statin

## 2023-04-18 NOTE — Assessment & Plan Note (Signed)
Cont allopurinol 

## 2023-04-18 NOTE — ED Notes (Signed)
Pt assisted with urinal at bedside. Call bell in reach.

## 2023-04-18 NOTE — ED Notes (Signed)
MD aware of cpap use at night.   Pt given phone to call wife.   Call bell in reach.

## 2023-04-18 NOTE — Progress Notes (Signed)
ANTICOAGULATION CONSULT NOTE  Pharmacy Consult for heparin infusion Indication: chest pain/ACS  Allergies  Allergen Reactions   Gabapentin     Other reaction(s): Other (See Comments) Tremors   Peanut-Containing Drug Products Anaphylaxis   Penicillins Hives and Rash    Has patient had a PCN reaction causing immediate rash, facial/tongue/throat swelling, SOB or lightheadedness with hypotension: Yes Has patient had a PCN reaction causing severe rash involving mucus membranes or skin necrosis: No Has patient had a PCN reaction that required hospitalization No Has patient had a PCN reaction occurring within the last 10 years: No If all of the above answers are "NO", then may proceed with Cephalosporin use.   Bee Venom Swelling   Influenza Vaccines Hives   Inh [Isoniazid] Hives   Jardiance [Empagliflozin] Other (See Comments)    Diarrhea   Kenalog [Triamcinolone Acetonide] Hives   Levaquin [Levofloxacin] Other (See Comments)    Tendon, ligament pain.    Lisinopril     Other reaction(s): Hyperkalemia   Nalfon [Fenoprofen Calcium] Hives   Naproxen    Peanut Oil    Nsaids Rash    Nalfon 600 Nalfon 600    Patient Measurements: Weight: 95.1 kg (209 lb 10.5 oz) Heparin Dosing Weight: 82.7 kg  Vital Signs: Temp: 98.4 F (36.9 C) (05/09 1630) Temp Source: Oral (05/09 1630) BP: 137/56 (05/09 1700) Pulse Rate: 76 (05/09 1700)  Labs: Recent Labs    04/18/23 0449 04/18/23 0634 04/18/23 0749 04/18/23 1235 04/18/23 1703  HGB 10.2*  --   --   --   --   HCT 31.9*  --   --   --   --   PLT 229  --   --   --   --   APTT  --   --  34  --   --   LABPROT  --   --  15.2  --   --   INR  --   --  1.2  --   --   HEPARINUNFRC  --   --   --   --  0.16*  CREATININE 3.74*  --   --   --   --   TROPONINIHS 34* 90*  --  454*  --      Estimated Creatinine Clearance: 16.4 mL/min (A) (by C-G formula based on SCr of 3.74 mg/dL (H)).   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 Pt not on anticoagulants per current med list   Goal of Therapy:  Heparin level 0.3-0.7 units/ml Monitor platelets by anticoagulation protocol: Yes   0509 1703 HL 0.16, subtherapeutic  Plan:  Heparin level is subtherapeutic Give heparin 2400 units IV  x 1, then increase rate of infusion to 1250 units/hr Recheck HL 8 hours after rate change Daily CBC while on heparin  Barrie Folk, PharmD 04/18/2023,5:52 PM

## 2023-04-18 NOTE — Assessment & Plan Note (Signed)
2D echo September 2023 with EF of 50 to 55% Appears euvolemic at present BP stable with normal chest x-ray Monitor volume status

## 2023-04-18 NOTE — Assessment & Plan Note (Signed)
BP stable Titrate home regimen 

## 2023-04-18 NOTE — Progress Notes (Signed)
*  PRELIMINARY RESULTS* Echocardiogram 2D Echocardiogram has been performed.  Carolyne Fiscal 04/18/2023, 2:32 PM

## 2023-04-18 NOTE — ED Notes (Signed)
Dr. Alvester Morin made aware of repeat trop result of 454, see new orders.

## 2023-04-18 NOTE — ED Notes (Signed)
Lab called to obtain admission labs  °

## 2023-04-18 NOTE — Assessment & Plan Note (Signed)
Cont statin

## 2023-04-18 NOTE — Assessment & Plan Note (Addendum)
Baseline creatinine 2.3-2.9 Creatinine 3.7 today with GFR of 15 Patient still making urine Appears euvolemic to mildly dry-consider small fluid bolus Patient denies any recent NSAID use apart from aspirin Will consult Dr. Cherylann Ratel with nephrology for formal recommendations Monitor

## 2023-04-18 NOTE — H&P (Addendum)
History and Physical    Patient: Mike Decker ZOX:[id number] DOB: [birth year] DOA: 04/18/2023 DOS: the patient was seen and examined on 04/18/2023 PCP: Mike Downs, MD  Patient coming from: Home  Chief Complaint:  Chief Complaint  Patient presents with   Chest Pain   HPI: Mike Decker is a 82 y.o. male with medical history significant of multiple medical issues including coronary artery disease status post stenting, chronic diastolic heart failure, hypertension, history of CVA, type 2 diabetes, stage IV CKD, gout, COPD presenting with NSTEMI and acute on chronic stage IV CKD.  Patient reports being woke up around 3 AM with severe 10 out of 10 chest pain.  Chest pain was central in nature with radiation up the neck and down the left arm.  Positive nausea and mild diaphoresis.  No abdominal pain.  No shortness of breath.  No orthopnea or PND.  Patient noted to have been admitted back in September for similar issues.  Recommendations for continued medical therapy.  Also had a cardiac catheterization back in January which showed multivessel disease.  Patient reports compliance to medication regimen including aspirin, Plavix and Ranexa.  Patient also reports compliance with Imdur.  Patient took 3 nitroglycerin with mild to moderate improvement in chest pain.  Per patient, blood sugars and blood pressures are somewhat labile at home.  No fevers. Presented to the ER afebrile, hemodynamically stable.  Satting well on room air.  White count 18, hemoglobin 10.2, troponin 34-90.  Creatinine 3.74, glucose 263, BNP 283.  EKG normal sinus rhythm with nonspecific intraventricular conduction delay.  Chest x-ray stable. Review of Systems: As mentioned in the history of present illness. All other systems reviewed and are negative. 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, esphageal   Chronic diastolic CHF (congestive heart failure) (HCC)    Colon polyp    Diabetes mellitus without complication (HCC)    Diverticulosis    Gout    Heart disease    Hemangioma    liver   Hyperlipidemia    Hypertension    Myocardial infarct [facility])    Ocular hypertension    Peripheral vascular disease (HCC)    Skin cancer    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HCC)    Sleep apnea    Vitreoretinal degeneration    Past Surgical History:  Procedure Laterality Date   CATARACT EXTRACTION  2011, 2012   COLONOSCOPY N/A 12/14/2018   Procedure: COLONOSCOPY;  Surgeon: Pasty Spillers, MD;  Location: [facility] ENDOSCOPY;  Service: Endoscopy;  Laterality: N/A;   CORONARY ANGIOPLASTY WITH STENT PLACEMENT  12/10/2010   CORONARY STENT INTERVENTION N/A 12/29/2019   Procedure: CORONARY STENT INTERVENTION;  Surgeon: Yvonne Kendall, MD;  Location: [facility] INVASIVE CV LAB;  Service: Cardiovascular;  Laterality: N/A;   ESOPHAGOGASTRODUODENOSCOPY N/A 12/14/2018   Procedure: ESOPHAGOGASTRODUODENOSCOPY (EGD);  Surgeon: Pasty Spillers, MD;  Location: [facility] ENDOSCOPY;  Service: Endoscopy;  Laterality: N/A;   ESOPHAGOGASTRODUODENOSCOPY (EGD) WITH PROPOFOL N/A 06/01/2016   Procedure: ESOPHAGOGASTRODUODENOSCOPY (EGD) WITH PROPOFOL;  Surgeon: Scot Jun, MD;  Location: [facility] ENDOSCOPY;  Service: Endoscopy;  Laterality: N/A;   ESOPHAGOGASTRODUODENOSCOPY (EGD) WITH PROPOFOL N/A 01/31/2023   Procedure: ESOPHAGOGASTRODUODENOSCOPY (EGD) WITH PROPOFOL;  Surgeon: Midge Minium, MD;  Location: [facility] ENDOSCOPY;  Service: Endoscopy;  Laterality: N/A;   FEMORAL ARTERY STENT Right  HERNIA REPAIR     x2   INTRAOCULAR LENS INSERTION     LEFT HEART CATH AND CORONARY ANGIOGRAPHY N/A 05/09/2017   Procedure: Left Heart Cath and Coronary Angiography;  Surgeon: Alwyn Pea, MD;  Location: [facility] INVASIVE CV LAB;  Service: Cardiovascular;  Laterality: N/A;   LEFT HEART CATH AND CORONARY ANGIOGRAPHY N/A  12/08/2018   Procedure: LEFT HEART CATH AND CORONARY ANGIOGRAPHY and possible PCI and stent;  Surgeon: Alwyn Pea, MD;  Location: [facility] INVASIVE CV LAB;  Service: Cardiovascular;  Laterality: N/A;   LEFT HEART CATH AND CORONARY ANGIOGRAPHY N/A 12/29/2019   Procedure: LEFT HEART CATH AND CORONARY ANGIOGRAPHY;  Surgeon: Yvonne Kendall, MD;  Location: [facility] INVASIVE CV LAB;  Service: Cardiovascular;  Laterality: N/A;   LEFT HEART CATH AND CORONARY ANGIOGRAPHY Left 12/13/2022   Procedure: LEFT HEART CATH AND CORONARY ANGIOGRAPHY;  Surgeon: Alwyn Pea, MD;  Location: [facility] INVASIVE CV LAB;  Service: Cardiovascular;  Laterality: Left;   NOSE SURGERY     submucous resection   PROSTATE SURGERY  12/10/2000   ROTATOR CUFF REPAIR Right    Social History:  reports that he quit smoking about 46 years ago. His smoking use included cigarettes. He has never used smokeless tobacco. He reports that he does not drink alcohol and does not use drugs.  Allergies  Allergen Reactions   Gabapentin     Other reaction(s): Other (See Comments) Tremors   Peanut-Containing Drug Products Anaphylaxis   Penicillins Hives and Rash    Has patient had a PCN reaction causing immediate rash, facial/tongue/throat swelling, SOB or lightheadedness with hypotension: Yes Has patient had a PCN reaction causing severe rash involving mucus membranes or skin necrosis: No Has patient had a PCN reaction that required hospitalization No Has patient had a PCN reaction occurring within the last 10 years: No If all of the above answers are "NO", then may proceed with Cephalosporin use.   Bee Venom Swelling   Influenza Vaccines Hives   Inh [Isoniazid] Hives   Jardiance [Empagliflozin] Other (See Comments)    Diarrhea   Kenalog [Triamcinolone Acetonide] Hives   Levaquin [Levofloxacin] Other (See Comments)    Tendon, ligament pain.    Lisinopril     Other reaction(s): Hyperkalemia   Nalfon [Fenoprofen Calcium] Hives    Naproxen    Peanut Oil    Nsaids Rash    Nalfon 600 Nalfon 600    Family History  Problem Relation Age of Onset   Arthritis Mother    Stroke Maternal Grandfather    Breast cancer Neg Hx     Prior to Admission medications   Medication Sig Start Date End Date Taking? Authorizing Provider  acetaminophen (TYLENOL) 500 MG tablet Take 1,000 mg by mouth every 8 (eight) hours as needed for mild pain.   Yes [provider]  allopurinol (ZYLOPRIM) 100 MG tablet Take 100 mg by mouth daily.   Yes [provider]  amLODipine (NORVASC) 2.5 MG tablet Take 2.5 mg by mouth daily.   Yes [provider]  aspirin EC 81 MG tablet Take 81 mg by mouth daily. 04/26/22  Yes [provider]  azelastine (ASTELIN) 0.1 % nasal spray USE 1 TO 2 SPRAYS IN EACH NOSTRIL TWICE A DAY 06/01/20  Yes [provider]  calcitRIOL (ROCALTROL) 0.25 MCG capsule Take 0.25 mcg by mouth daily.   Yes [provider]  cetirizine (ZYRTEC) 5 MG tablet Take 5 mg by mouth daily.   Yes [provider]  citalopram (CELEXA) 10 MG tablet Take 1 tablet (10 mg total) by mouth daily. 03/12/22  Yes Masoud, Renda Rolls, MD  clopidogrel (PLAVIX) 75 MG tablet TAKE 1 TABLET BY MOUTH EVERY DAY 07/02/22  Yes Masoud, Renda Rolls, MD  colchicine 0.6 MG tablet Take 3 tablets (1.8 mg total) by mouth daily as needed (gout flares). 12/30/19  Yes Darlin Priestly, MD  [employer] VITAMIN B12 1000 MCG tablet TAKE 1 TABLET BY MOUTH EVERY DAY 11/21/21  Yes Rickard Patience, MD  EPINEPHrine (EPI-PEN) 0.3 mg/0.3 mL SOAJ injection Inject into the muscle as needed (anaphylaxis).    Yes [provider]  ergocalciferol (VITAMIN D2) 1.25 MG (50000 UT) capsule Take 50,000 Units by mouth every 30 (thirty) days.   Yes [provider]  ferrous sulfate 325 (65 FE) MG EC tablet TAKE 1 TABLET BY MOUTH EVERY DAY 02/06/21  Yes Rickard Patience, MD  fluticasone [facility]) 50 MCG/ACT nasal spray USE 1 SPRAY IN EACH NOSTRIL EVERY DAY 09/12/20  Yes  Masoud, Renda Rolls, MD  folic acid (FOLVITE) 400 MCG tablet Take 400 mcg by mouth daily. 10/14/20  Yes [provider]  furosemide (LASIX) 20 MG tablet Take 20 mg by mouth daily.   Yes [provider]  hydrALAZINE (APRESOLINE) 25 MG tablet Take 25 mg by mouth 3 (three) times daily. 09/12/20  Yes [provider]  insulin aspart (NOVOLOG) 100 UNIT/ML FlexPen 10 UNITS IN AM, 30 UNITS AT LUNCH, 30 UNITS AT [facility] 04/25/20  Yes [provider]  insulin glargine (LANTUS) 100 UNIT/ML injection Inject 0.35 mLs (35 Units total) into the skin at bedtime. This is a decrease from your previous 45 units nightly. Patient taking differently: Inject 50 Units into the skin at bedtime. 12/30/19  Yes Darlin Priestly, MD  isosorbide mononitrate (IMDUR) 120 MG 24 hr tablet Take 120 mg by mouth daily. 12/16/20  Yes [provider]  JARDIANCE 10 MG TABS tablet Take 10 mg by mouth daily. 02/19/22  Yes [provider]  magnesium oxide (MAG-OX) 400 (240 Mg) MG tablet Take 1 tablet by mouth daily. 04/25/22  Yes [provider]  methocarbamol (ROBAXIN) 500 MG tablet Take 500 mg by mouth at bedtime. 11/08/18  Yes [provider]  metoprolol tartrate (LOPRESSOR) 50 MG tablet Take 50 mg by mouth 2 (two) times daily.   Yes [provider]  mometasone [facility]) 220 MCG/ACT inhaler Inhale 2 puffs into the lungs daily. 05/30/22  Yes [provider]  montelukast (SINGULAIR) 10 MG tablet TAKE 1 TABLET BY MOUTH EVERYDAY AT BEDTIME 03/14/23  Yes McDonough, Lauren K, PA-C  nitroGLYCERIN (NITROSTAT) 0.4 MG SL tablet Place 0.4 mg under the tongue every 5 (five) minutes x 3 doses as needed for chest pain.    Yes [provider]  pantoprazole (PROTONIX) 40 MG tablet Take 1 tablet (40 mg total) by mouth 2 (two) times daily before a meal. 12/17/19  Yes Amin, Ankit Chirag, MD  ranolazine (RANEXA) 500 MG 12 hr tablet Take 1,000 mg by mouth 2 (two) times daily. 06/06/22  Yes  [provider]  rosuvastatin (CRESTOR) 10 MG tablet Take 1 tablet (10 mg total) by mouth at bedtime. Patient taking differently: Take 40 mg by mouth daily. 08/17/22 04/18/23 Yes Charise Killian, MD  sodium bicarbonate 650 MG tablet Take 650 mg by mouth 2 (two) times daily.   Yes [provider]  Tiotropium Bromide-Olodaterol (STIOLTO RESPIMAT) 2.5-2.5 MCG/ACT AERS Inhale 1 each into the lungs daily.   Yes [provider]  azithromycin (ZITHROMAX) 250 MG tablet Take one tab a day for 10 days for uri Patient not taking: Reported on 04/18/2023 03/15/23   Carlean Jews, PA-C  metoprolol succinate (TOPROL-XL) 50 MG 24 hr tablet Take 1 tablet (50 mg total) by mouth daily. Take with or immediately following a meal. Patient not taking: Reported on 04/18/2023 08/18/22 04/18/23  Charise Killian, MD  predniSONE (DELTASONE) 10 MG tablet Take one tab 3 x day for 3 days, then take one tab 2 x a day for 3 days and then take one tab a day for 3 days for copd Patient not taking: Reported on 04/18/2023 03/15/23   Carlean Jews, PA-C  sodium fluoride (DENTA 5000 PLUS) 1.1 % CREA dental cream See admin instructions. 01/03/22   [provider]  torsemide 40 MG TABS Take 40 mg by mouth 3 (three) times a week. Take once on MWF Patient not taking: Reported on 04/18/2023 08/20/22   Charise Killian, MD    Physical Exam: Vitals:   04/18/23 0720 04/18/23 0800 04/18/23 0830 04/18/23 0841  BP:  (!) 130/59 (!) 128/58   Pulse: 65 67 70 69  Resp: 11 16 19 13   Temp:    98.2 F (36.8 C)  TempSrc:    Oral  SpO2: 95% 90% 93% 96%  Weight:       Physical Exam Constitutional:      Appearance: He is obese.  HENT:     Head: Normocephalic and atraumatic.     Nose: Nose normal.     Mouth/Throat:     Mouth: Mucous membranes are moist.  Eyes:     Pupils: Pupils are equal, round, and reactive to light.  Cardiovascular:     Rate and Rhythm: Normal rate and regular rhythm.  Pulmonary:      Effort: Pulmonary effort is normal.     Breath sounds: No wheezing or rales.  Abdominal:     General: Bowel sounds are normal.  Musculoskeletal:        General: Normal range of motion.     Cervical back: Normal range of motion.  Skin:    General: Skin is warm.  Neurological:     General: No focal deficit present.  Psychiatric:        Mood and Affect: Mood normal.     Data Reviewed:  There are no new results to review at this time. DG Chest Portable 1 View CLINICAL DATA:  Chest pain  EXAM: PORTABLE CHEST 1 VIEW  COMPARISON:  12/12/2022  FINDINGS: Borderline heart size that is stable. Mild interstitial coarsening without Kerley line or air bronchogram. No effusion or pneumothorax. Artifact from EKG leads.  IMPRESSION: No edema or focal pneumonia.  Electronically Signed   By: Tiburcio Pea M.D.   On: 04/18/2023 05:15  Lab Results  Component Value Date   WBC 18.2 (H) 04/18/2023   HGB 10.2 (L) 04/18/2023   HCT 31.9 (L) 04/18/2023   MCV 98.8 04/18/2023   PLT 229 04/18/2023   Last metabolic panel Lab Results  Component Value Date   GLUCOSE 263 (H) 04/18/2023   NA 130 (L) 04/18/2023   K 4.2 04/18/2023   CL 95 (L) 04/18/2023   CO2 21 (L) 04/18/2023   BUN 78 (H) 04/18/2023   CREATININE 3.74 (H) 04/18/2023   GFRNONAA 15 (L) 04/18/2023   CALCIUM 8.3 (L) 04/18/2023   PHOS 4.1 05/13/2017   PROT 6.2 (L) 04/18/2023   ALBUMIN 3.5 04/18/2023  LABGLOB 2.7 12/30/2018   BILITOT 1.0 04/18/2023   ALKPHOS 49 04/18/2023   AST 19 04/18/2023   ALT 13 04/18/2023   ANIONGAP 14 04/18/2023    Assessment and Plan: * NSTEMI (non-ST elevated myocardial infarction) (HCC) Baseline history of CAD with unstable angina, S/p PCI/stent to proximal RCA and proximal circumflex Noted cardiac cath 12/2022 showing multivessel disease  Typical 10 out of 10 chest pain that woke him up this morning with radiation up the neck and down the left arm. Troponin 30-->90, though AoCKD stage  4 may cause poor renal clearance of cardiac enzymes.  EKG normal sinus rhythm with nonspecific intraventricular conduction delay Heart score 8+  Status post full dose aspirin Started on a heparin drip Dr. Darrold Junker w/ cardiology consulted for evaluation  Trend troponin      Leukocytosis White count 18.2 on presentation ?  Secondary to NSTEMI stress reaction  No overt source of infection at present Chest x-ray stable Urinalysis pending Panculture Patient afebrile Will otherwise monitor for now Reevaluate if patient declines from a clinical standpoint   Acute renal failure superimposed on stage 4 chronic kidney disease (HCC) Baseline creatinine 2.3-2.9 Creatinine 3.7 today with GFR of 15 Patient still making urine Appears euvolemic to mildly dry-consider small fluid bolus Patient denies any recent NSAID use apart from aspirin Will consult Dr. Cherylann Ratel with nephrology for formal recommendations Monitor   Chronic diastolic heart failure (HCC) 2D echo September 2023 with EF of 50 to 55% Appears euvolemic-mildly volume overloaded  at present BP stable with normal chest x-ray Monitor volume status  Essential hypertension BP stable  Titrate home regimen    Type II diabetes mellitus with renal manifestations (HCC) SSI A1c  Hyperlipidemia Cont statin    Anemia of chronic renal failure, stage 4 (severe) (HCC) Hemoglobin 10.2 today which appears to be near baseline Continue home regimen Follow  Gout Cont allopurinol    Chronic obstructive pulmonary disease (HCC) Stable from a resp standpoint  Cont home inhalers    TIA (transient ischemic attack) Cont antiplatelet regimen and high dose statin      Advance Care Planning:   Code Status: Full Code   Consults: Cardiology- Dr. Darrold Junker, Nephrology-Dr. Cherylann Ratel   Family Communication: No family at the bedside   Severity of Illness: The appropriate patient status for this patient is INPATIENT. Inpatient status  is judged to be reasonable and necessary in order to provide the required intensity of service to ensure the patient's safety. The patient's presenting symptoms, physical exam findings, and initial radiographic and laboratory data in the context of their chronic comorbidities is felt to place them at high risk for further clinical deterioration. Furthermore, it is not anticipated that the patient will be medically stable for discharge from the hospital within 2 midnights of admission.   * I certify that at the point of admission it is my clinical judgment that the patient will require inpatient hospital care spanning beyond 2 midnights from the point of admission due to high intensity of service, high risk for further deterioration and high frequency of surveillance required.*  Author: Floydene Flock, MD 04/18/2023 8:52 AM  For on call review [email or web address].

## 2023-04-18 NOTE — Assessment & Plan Note (Signed)
SSI A1c 

## 2023-04-18 NOTE — Consult Note (Signed)
[facility] CARDIOLOGY CONSULT NOTE       Patient ID: Mike Decker MRN: [id number] DOB/AGE: 1941-09-24 82 y.o.  Admit date: 04/18/2023 Referring Physician Dr. Andrena Mews Primary Physician Dr. Harl Bowie Primary Cardiologist Dr. 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  Cardiology is consulted for further assistance.  The patient presents with 10/10 chest pain that radiated down his left arm and up into his jaw that woke him from his sleep around 7 AM today.  Typically his chest pain he rates about a 6 out of 10 but this was much more severe than usual.  He took nitroglycerin under his tongue and 324 mg of aspirin with some transient relief in his chest pain but did recur in the emergency department.  Eventually his chest pain improved to about a 6 out of 10 with fentanyl and morphine.  At my time of evaluation he was soundly sleeping while sitting upright in the ED stretcher, he awakens easily to voice and appears calm and comfortable, hypertensive and hemodynamically stable with reported 6 out of 10 chest pain.  He was recently seen by Dr. Jose Persia in clinic mid April where his isosorbide was decreased to 120 mg once a day, and his Lasix was also decreased down to 20 mg once a day due to low diastolic blood pressures, sometimes in the 30s per the patient.  He admits to fluid accumulation in his belly and shortness of breath since decreasing his diuretic, thinks he has gained 9 pounds over the past 4 days.  His renal function is also significantly worsened from baseline with current BUN/creatinine  78/3.74 and GFR 15.  In April of this year his creatinine was 3.04 and GFR was 20.  He has chronic orthopnea which is no worse than usual, but is on 2 L of oxygen without a usual baseline requirement.  He has occasional palpitations and transient vision changes which are short lasting and not currently present.  Initial high-sensitivity troponin was borderline elevated at 34, 90 and repeat 6 hours later had increased to 454 with more repeats pending.  EKG show normal sinus rhythm, nonspecific IVCD, and inverted T wave in lead III without reciprocal ST elevations or other acute changes.   Review of systems complete and found to be negative unless listed above     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, esphageal   Chronic diastolic CHF (congestive heart failure) (HCC)    Colon polyp    Diabetes mellitus without complication (HCC)    Diverticulosis    Gout    Heart disease    Hemangioma    liver   Hyperlipidemia    Hypertension    Myocardial infarct (HCC)    Ocular hypertension    Peripheral vascular disease (HCC)    Skin cancer    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HCC)    Sleep apnea  Vitreoretinal degeneration     Past Surgical History:  Procedure Laterality Date   CATARACT EXTRACTION  2011, 2012   COLONOSCOPY N/A 12/14/2018   Procedure: COLONOSCOPY;  Surgeon: Pasty Spillers, MD;  Location: [facility] ENDOSCOPY;  Service: Endoscopy;  Laterality: N/A;   CORONARY ANGIOPLASTY WITH STENT PLACEMENT  12/10/2010   CORONARY STENT INTERVENTION N/A 12/29/2019   Procedure: CORONARY STENT INTERVENTION;  Surgeon: Yvonne Kendall, MD;  Location: [facility] INVASIVE CV LAB;  Service: Cardiovascular;  Laterality: N/A;   ESOPHAGOGASTRODUODENOSCOPY N/A 12/14/2018   Procedure: ESOPHAGOGASTRODUODENOSCOPY (EGD);  Surgeon: Pasty Spillers, MD;  Location: [facility]  ENDOSCOPY;  Service: Endoscopy;  Laterality: N/A;   ESOPHAGOGASTRODUODENOSCOPY (EGD) WITH PROPOFOL N/A 06/01/2016   Procedure: ESOPHAGOGASTRODUODENOSCOPY (EGD) WITH PROPOFOL;  Surgeon: Scot Jun, MD;  Location: [facility] ENDOSCOPY;  Service: Endoscopy;  Laterality: N/A;   ESOPHAGOGASTRODUODENOSCOPY (EGD) WITH PROPOFOL N/A 01/31/2023   Procedure: ESOPHAGOGASTRODUODENOSCOPY (EGD) WITH PROPOFOL;  Surgeon: Midge Minium, MD;  Location: [facility] ENDOSCOPY;  Service: Endoscopy;  Laterality: N/A;   FEMORAL ARTERY STENT Right    HERNIA REPAIR     x2   INTRAOCULAR LENS INSERTION     LEFT HEART CATH AND CORONARY ANGIOGRAPHY N/A 05/09/2017   Procedure: Left Heart Cath and Coronary Angiography;  Surgeon: Alwyn Pea, MD;  Location: [facility] INVASIVE CV LAB;  Service: Cardiovascular;  Laterality: N/A;   LEFT HEART CATH AND CORONARY ANGIOGRAPHY N/A 12/08/2018   Procedure: LEFT HEART CATH AND CORONARY ANGIOGRAPHY and possible PCI and stent;  Surgeon: Alwyn Pea, MD;  Location: [facility] INVASIVE CV LAB;  Service: Cardiovascular;  Laterality: N/A;   LEFT HEART CATH AND CORONARY ANGIOGRAPHY N/A 12/29/2019   Procedure: LEFT HEART CATH AND CORONARY ANGIOGRAPHY;  Surgeon: Yvonne Kendall, MD;  Location: [facility] INVASIVE CV LAB;  Service: Cardiovascular;  Laterality: N/A;   LEFT HEART CATH AND CORONARY ANGIOGRAPHY Left 12/13/2022   Procedure: LEFT HEART CATH AND CORONARY ANGIOGRAPHY;  Surgeon: Alwyn Pea, MD;  Location: [facility] INVASIVE CV LAB;  Service: Cardiovascular;  Laterality: Left;   NOSE SURGERY     submucous resection   PROSTATE SURGERY  12/10/2000   ROTATOR CUFF REPAIR Right     (Not in a hospital admission)  Social History   Socioeconomic History   Marital status: Married    Spouse name: Not on file   Number of children: 2   Years of education: College 3 years   Highest education level: Associate degree: occupational, Scientist, product/process development, or vocational program  Occupational History   Occupation:  retired  Tobacco Use   Smoking status: Former    Years: 20    Types: Cigarettes    Quit date: 12/10/1976    Years since quitting: 46.3   Smokeless tobacco: Never  Vaping Use   Vaping Use: Never used  Substance and Sexual Activity   Alcohol use: No   Drug use: No   Sexual activity: Not Currently  Other Topics Concern   Not on file  Social History Narrative   Not on file   Social Determinants of Health   Financial Resource Strain: Low Risk  (09/15/2021)   Overall Financial Resource Strain (CARDIA)    Difficulty of Paying Living Expenses: Not very hard  Food Insecurity: No Food Insecurity (04/18/2023)   Hunger Vital Sign    Worried About Running Out of Food in the Last Year: Never true    Ran Out of Food in the Last Year: Never true  Transportation Needs: No Transportation Needs (04/18/2023)   PRAPARE -  Administrator, Civil Service (Medical): No    Lack of Transportation (Non-Medical): No  Physical Activity: Insufficiently Active (09/15/2021)   Exercise Vital Sign    Days of Exercise per Week: 7 days    Minutes of Exercise per Session: 20 min  Stress: No Stress Concern Present (09/15/2021)   [employer] of Occupational Health - Occupational Stress Questionnaire    Feeling of Stress : Not at all  Social Connections: Moderately Isolated (09/15/2021)   Social Connection and Isolation Panel [NHANES]    Frequency of Communication with Friends and Family: Once a week    Frequency of Social Gatherings with Friends and Family: Once a week    Attends Religious Services: More than 4 times per year    Active Member of [employer] or Organizations: No    Attends Banker Meetings: Never    Marital Status: Married  Catering manager Violence: Not At Risk (04/18/2023)   Humiliation, Afraid, Rape, and Kick questionnaire    Fear of Current or Ex-Partner: No    Emotionally Abused: No    Physically Abused: No    Sexually Abused: No    Family History  Problem Relation Age  of Onset   Arthritis Mother    Stroke Maternal Grandfather    Breast cancer Neg Hx      No intake or output data in the 24 hours ending 04/18/23 1445  Vitals:   04/18/23 0841 04/18/23 1230 04/18/23 1241 04/18/23 1330  BP:  (!) 124/49  (!) 148/54  Pulse: 69 80 76 77  Resp: 13 17 20 20   Temp: 98.2 F (36.8 C)  98.6 F (37 C)   TempSrc: Oral  Oral   SpO2: 96% 96% 96% 94%  Weight:        PHYSICAL EXAM General: Elderly appearing Caucasian male, sitting upright in ED stretcher sleeping upon my entry into the room and awakens easily to voice. HEENT:  Normocephalic and atraumatic. Neck:  No JVD.  Lungs: Normal respiratory effort on oxygen by nasal cannula.  Bibasilar crackles and decreased breath sounds in upper lung fields without appreciable wheezes.   Heart: HRRR . Normal S1 and S2 without gallops or murmurs.  Abdomen: Mildly distended appearing with excess adiposity. Msk: Normal strength and tone for age. Extremities: Warm and well perfused. No clubbing, cyanosis. No peripheral edema.  Neuro: Alert and oriented X 3. Psych:  Answers questions appropriately.   Labs: Basic Metabolic Panel: Recent Labs    04/18/23 0449  NA 130*  K 4.2  CL 95*  CO2 21*  GLUCOSE 263*  BUN 78*  CREATININE 3.74*  CALCIUM 8.3*  MG 2.2   Liver Function Tests: Recent Labs    04/18/23 0449  AST 19  ALT 13  ALKPHOS 49  BILITOT 1.0  PROT 6.2*  ALBUMIN 3.5   No results for input(s): "LIPASE", "AMYLASE" in the last 72 hours. CBC: Recent Labs    04/18/23 0449  WBC 18.2*  NEUTROABS 15.5*  HGB 10.2*  HCT 31.9*  MCV 98.8  PLT 229   Cardiac Enzymes: Recent Labs    04/18/23 0449 04/18/23 0634 04/18/23 1235  TROPONINIHS 34* 90* 454*   BNP: Recent Labs    04/18/23 0449  BNP 283.4*   D-Dimer: No results for input(s): "DDIMER" in the last 72 hours. Hemoglobin A1C: No results for input(s): "HGBA1C" in the last 72 hours. Fasting Lipid Panel: No results for input(s): "CHOL",  "HDL", "LDLCALC", "TRIG", "CHOLHDL", "LDLDIRECT" in the last  72 hours. Thyroid Function Tests: No results for input(s): "TSH", "T4TOTAL", "T3FREE", "THYROIDAB" in the last 72 hours.  Invalid input(s): "FREET3" Anemia Panel: No results for input(s): "VITAMINB12", "FOLATE", "FERRITIN", "TIBC", "IRON", "RETICCTPCT" in the last 72 hours.   Radiology: DG Chest Portable 1 View  Result Date: 04/18/2023 CLINICAL DATA:  Chest pain EXAM: PORTABLE CHEST 1 VIEW COMPARISON:  12/12/2022 FINDINGS: Borderline heart size that is stable. Mild interstitial coarsening without Kerley line or air bronchogram. No effusion or pneumothorax. Artifact from EKG leads. IMPRESSION: No edema or focal pneumonia. Electronically Signed   By: Tiburcio Pea M.D.   On: 04/18/2023 05:15    LHC 12/13/2022     Prox LAD to Mid LAD lesion is 50% stenosed.   Dist LAD lesion is 75% stenosed.   Dist RCA lesion is 40% stenosed.   Prox RCA to Mid RCA lesion is 40% stenosed.   Ost 1st Mrg to 1st Mrg lesion is 50% stenosed.   Non-stenotic Prox Cx lesion was previously treated.   Non-stenotic Prox RCA lesion was previously treated.   The left ventricular systolic function is normal.   LV end diastolic pressure is normal.   The left ventricular ejection fraction is 55-65% by visual estimate.   Conclusion Left heart cath right radial approach because of unstable anginal symptoms known coronary disease   Left ventriculogram preserved left ventricular function EF around 55 to 60% mild left ventricular enlargement   Coronaries Left main large minor irregularities  LAD large with diffuse minor irregularities and a 50% mid lesion distal 50-75 Circumflex medium to large minor irregularities widely patent proximal stent OM1 diffusely diseased 50-75 small vessel RCA large proximal stent widely patent 50% mid lesion mixed dominant All vessels with TIMI-3 flow Intervention deferred not indicated  TELEMETRY reviewed by me (LT) 04/18/2023 :  Sinus rhythm rate 60s to 80s  EKG reviewed by me: normal sinus rhythm, nonspecific IVCD, and inverted T wave in lead III without reciprocal ST elevations or other acute changes.  Data reviewed by me (LT) 04/18/2023: Last outpatient cardiology note, ED note, admission H&P last 24h vitals tele labs imaging I/O   Principal Problem:   NSTEMI (non-ST elevated myocardial infarction) (HCC) Active Problems:   TIA (transient ischemic attack)   Essential hypertension   Hyperlipidemia   Anemia of chronic renal failure, stage 4 (severe) (HCC)   Chronic diastolic heart failure (HCC)   Chronic obstructive pulmonary disease (HCC)   Gout   Type II diabetes mellitus with renal manifestations (HCC)   Acute renal failure superimposed on stage 4 chronic kidney disease (HCC)   Leukocytosis    ASSESSMENT AND PLAN:  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  Cardiology is consulted for further assistance.  # NSTEMI Presents with 10/10 chest pain that radiated to his left arm and up into his jaw, EKGs without significant abnormality, but troponin is uptrending to a current peak of 454 with repeats pending.  He has known underlying CAD with patent stents to the RCA and proximal LCx by his last LHC in January without any significant obstructive disease requiring intervention at that time.  Hesitant  to proceed immediately with LHC in the setting of significant AKI on his known renal insufficiency as below, suspect some of his discomfort related to acute on chronic CHF as well. -S/p 325 mg aspirin, continue 81 mg aspirin daily -Continue heparin drip for now, likely continue for 48  hours -Continue Crestor 40 mg daily for now, may need to switch to atorvastatin -As needed Nitropaste, morphine for anginal pain -S/p isosorbide 90 mg this morning -Continue Ranexa 1000 mg twice daily -Continue metoprolol but at reduced dose 12.5 mg twice daily with borderline BP -Echo complete, further recommendations based on results -Will consider ischemic evaluation pending clinical course  # Acute on chronic HFpEF # Moderate aortic stenosis Clinically hypervolemic with an oxygen requirement, crackles on exam, and a distended abdomen with a ~9 pound weight gain since the weekend in the setting of decreased dose of his regular diuretic with known renal insufficiency as below.  Chest x-ray with some pulmonary edema, BNP only elevated in the 200s. -S/p 40 mg of IV Lasix x 1, dose an additional 40 mg this afternoon -GDMT with metoprolol -Previously on Jardiance prior to admission, hold this for now with AKI, addition of MRA, ACE/Arni limited by renal insufficiency  # AKI on CKD 4 BUN/creatinine on admission significantly elevated at 78/3.74 and GFR 15.  Baseline from March 2024 was a creatinine of 3.04 and GFR of 20.  Made some urine after IV Lasix, continue to monitor renal function closely.  This patient's plan of care was discussed and created with Dr. Darrold Junker and he is in agreement.  Signed: Rebeca Allegra , PA-C 04/18/2023, 2:45 PM [facility] Cardiology

## 2023-04-19 DIAGNOSIS — I214 Non-ST elevation (NSTEMI) myocardial infarction: Secondary | ICD-10-CM | POA: Diagnosis not present

## 2023-04-19 LAB — COMPREHENSIVE METABOLIC PANEL
ALT: 18 U/L (ref 0–44)
AST: 39 U/L (ref 15–41)
Albumin: 3.8 g/dL (ref 3.5–5.0)
Alkaline Phosphatase: 51 U/L (ref 38–126)
Anion gap: 14 (ref 5–15)
BUN: 80 mg/dL — ABNORMAL HIGH (ref 8–23)
CO2: 25 mmol/L (ref 22–32)
Calcium: 8.5 mg/dL — ABNORMAL LOW (ref 8.9–10.3)
Chloride: 94 mmol/L — ABNORMAL LOW (ref 98–111)
Creatinine, Ser: 3.64 mg/dL — ABNORMAL HIGH (ref 0.61–1.24)
GFR, Estimated: 16 mL/min — ABNORMAL LOW (ref >60)
Glucose, Bld: 196 mg/dL — ABNORMAL HIGH (ref 70–99)
Potassium: 4.1 mmol/L (ref 3.5–5.1)
Sodium: 133 mmol/L — ABNORMAL LOW (ref 135–145)
Total Bilirubin: 1 mg/dL (ref 0.3–1.2)
Total Protein: 6.8 g/dL (ref 6.5–8.1)

## 2023-04-19 LAB — HEPARIN LEVEL (UNFRACTIONATED)
Heparin Unfractionated: 0.28 IU/mL — ABNORMAL LOW (ref 0.30–0.70)
Heparin Unfractionated: 0.42 IU/mL (ref 0.30–0.70)

## 2023-04-19 LAB — CBC
HCT: 31.9 % — ABNORMAL LOW (ref 39.0–52.0)
Hemoglobin: 10.4 g/dL — ABNORMAL LOW (ref 13.0–17.0)
MCH: 32.1 pg (ref 26.0–34.0)
MCHC: 32.6 g/dL (ref 30.0–36.0)
MCV: 98.5 fL (ref 80.0–100.0)
Platelets: 237 10*3/uL (ref 150–400)
RBC: 3.24 MIL/uL — ABNORMAL LOW (ref 4.22–5.81)
RDW: 14.7 % (ref 11.5–15.5)
WBC: 13.4 10*3/uL — ABNORMAL HIGH (ref 4.0–10.5)
nRBC: 0 % (ref 0.0–0.2)

## 2023-04-19 LAB — GLUCOSE, CAPILLARY
Glucose-Capillary: 212 mg/dL — ABNORMAL HIGH (ref 70–99)
Glucose-Capillary: 245 mg/dL — ABNORMAL HIGH (ref 70–99)

## 2023-04-19 LAB — CULTURE, BLOOD (ROUTINE X 2): Special Requests: ADEQUATE

## 2023-04-19 LAB — TROPONIN I (HIGH SENSITIVITY): Troponin I (High Sensitivity): 782 ng/L (ref <18)

## 2023-04-19 LAB — CBG MONITORING, ED
Glucose-Capillary: 310 mg/dL — ABNORMAL HIGH (ref 70–99)
Glucose-Capillary: 261 mg/dL — ABNORMAL HIGH (ref 70–99)

## 2023-04-19 MED ORDER — HEPARIN BOLUS VIA INFUSION
1250.0000 [IU] | Freq: Once | INTRAVENOUS | Status: AC
  Administered 2023-04-19: 1250 [IU] via INTRAVENOUS
  Filled 2023-04-19: qty 1250

## 2023-04-19 MED ORDER — ISOSORBIDE MONONITRATE ER 60 MG PO TB24
60.0000 mg | ORAL_TABLET | Freq: Every day | ORAL | Status: DC
  Administered 2023-04-19 – 2023-04-22 (×4): 60 mg via ORAL
  Filled 2023-04-19 (×4): qty 1

## 2023-04-19 MED ORDER — MORPHINE SULFATE (PF) 2 MG/ML IV SOLN
1.0000 mg | INTRAVENOUS | Status: DC | PRN
  Administered 2023-04-20 – 2023-04-23 (×2): 1 mg via INTRAVENOUS
  Filled 2023-04-19 (×2): qty 1

## 2023-04-19 MED ORDER — ARFORMOTEROL TARTRATE 15 MCG/2ML IN NEBU
15.0000 ug | INHALATION_SOLUTION | Freq: Two times a day (BID) | RESPIRATORY_TRACT | Status: DC
  Administered 2023-04-20 – 2023-04-24 (×9): 15 ug via RESPIRATORY_TRACT
  Filled 2023-04-19 (×10): qty 2

## 2023-04-19 MED ORDER — UMECLIDINIUM BROMIDE 62.5 MCG/ACT IN AEPB
1.0000 | INHALATION_SPRAY | Freq: Every day | RESPIRATORY_TRACT | Status: DC
  Administered 2023-04-20 – 2023-04-24 (×5): 1 via RESPIRATORY_TRACT
  Filled 2023-04-19: qty 7

## 2023-04-19 MED ORDER — FUROSEMIDE 10 MG/ML IJ SOLN
40.0000 mg | Freq: Two times a day (BID) | INTRAMUSCULAR | Status: DC
  Administered 2023-04-19 – 2023-04-21 (×5): 40 mg via INTRAVENOUS
  Filled 2023-04-19 (×5): qty 4

## 2023-04-19 MED ORDER — INSULIN ASPART 100 UNIT/ML IJ SOLN
4.0000 [IU] | Freq: Three times a day (TID) | INTRAMUSCULAR | Status: DC
  Administered 2023-04-19 – 2023-04-20 (×5): 4 [IU] via SUBCUTANEOUS
  Filled 2023-04-19 (×5): qty 1

## 2023-04-19 MED ORDER — DOCUSATE SODIUM 100 MG PO CAPS
100.0000 mg | ORAL_CAPSULE | Freq: Two times a day (BID) | ORAL | Status: DC
  Administered 2023-04-19 – 2023-04-23 (×9): 100 mg via ORAL
  Filled 2023-04-19 (×10): qty 1

## 2023-04-19 MED ORDER — ATORVASTATIN CALCIUM 80 MG PO TABS
80.0000 mg | ORAL_TABLET | Freq: Every day | ORAL | Status: DC
  Administered 2023-04-20 – 2023-04-24 (×5): 80 mg via ORAL
  Filled 2023-04-19 (×5): qty 1

## 2023-04-19 MED ORDER — INSULIN GLARGINE-YFGN 100 UNIT/ML ~~LOC~~ SOLN
27.0000 [IU] | Freq: Every day | SUBCUTANEOUS | Status: DC
  Administered 2023-04-19 – 2023-04-23 (×5): 27 [IU] via SUBCUTANEOUS
  Filled 2023-04-19 (×6): qty 0.27

## 2023-04-19 MED ORDER — NITROGLYCERIN 2 % TD OINT
1.0000 [in_us] | TOPICAL_OINTMENT | Freq: Four times a day (QID) | TRANSDERMAL | Status: DC | PRN
  Filled 2023-04-19: qty 1

## 2023-04-19 MED ORDER — RANOLAZINE ER 500 MG PO TB12
500.0000 mg | ORAL_TABLET | Freq: Every day | ORAL | Status: DC
  Administered 2023-04-20 – 2023-04-23 (×4): 500 mg via ORAL
  Filled 2023-04-19 (×4): qty 1

## 2023-04-19 NOTE — Progress Notes (Signed)
[facility]  ROUNDING NOTE   Subjective:   Mike Decker is an 82 year old male with past medical conditions including type 2 diabetes, CVA, chronic diastolic heart failure, CAD, COPD, and chronic kidney disease stage IV.  Patient presents to the emergency department complaining of chest pain that radiates to his jaw and left arm.  Patient has been admitted for Other chest pain [R07.89] NSTEMI (non-ST elevated myocardial infarction) [facility]) [I21.4]  Patient is known to our practice and followed by Dr. Wynelle Link.  Patient states he awoke this this morning experiencing chest pain that radiated to his left arm and jaw.  Patient does have history of cardiac events.  Patient confirms cardiac catheterization in January which showed multivessel disease.  Patient states he has been compliant with all medications.  Currently denies chest pain.  Seen sitting at side of bed, completed breakfast tray at bedside.  2 L nasal cannula, room air at baseline.  Heparin drip in place.  Labs on ED arrival include sodium 130, serum bicarb 21, glucose 263, BUN 78, creatinine 3.74 with GFR 15, BNP 283, troponin 34, and hemoglobin 10.2.  Troponin has continued to elevate, 454.  Cardiology consulted.   Objective:  Vital signs in last 24 hours:  Temp:  [97.2 F (36.2 C)-99.2 F (37.3 C)] 98.7 F (37.1 C) (05/10 0800) Pulse Rate:  [49-114] 114 (05/10 1000) Resp:  [12-25] 12 (05/10 0900) BP: (97-167)/(50-72) 156/58 (05/10 0900) SpO2:  [90 %-99 %] 92 % (05/10 0800) FiO2 (%):  [28 %] 28 % (05/09 2330)  Weight change:  Filed Weights   04/18/23 0441  Weight: 95.1 kg    Intake/Output: I/O last 3 completed shifts: In: 271.1 [I.V.:271.1] Out: 2100 [Urine:2100]   Intake/Output this shift:  Total I/O In: -  Out: 800 [Urine:800]  Physical Exam: General: NAD  Head: Normocephalic, atraumatic. Moist oral mucosal membranes  Eyes: Anicteric  Lungs:  Clear to auscultation  Heart: Regular rate and  rhythm  Abdomen:  Soft, nontender, obese  Extremities: No peripheral edema.  Neurologic: Nonfocal, moving all four extremities  Skin: No lesions  Access: None     Basic Metabolic Panel: Recent Labs  Lab 04/18/23 0449 04/19/23 0134  NA 130* 133*  K 4.2 4.1  CL 95* 94*  CO2 21* 25  GLUCOSE 263* 196*  BUN 78* 80*  CREATININE 3.74* 3.64*  CALCIUM 8.3* 8.5*  MG 2.2  --     Liver Function Tests: Recent Labs  Lab 04/18/23 0449 04/19/23 0134  AST 19 39  ALT 13 18  ALKPHOS 49 51  BILITOT 1.0 1.0  PROT 6.2* 6.8  ALBUMIN 3.5 3.8   No results for input(s): "LIPASE", "AMYLASE" in the last 168 hours. No results for input(s): "AMMONIA" in the last 168 hours.  CBC: Recent Labs  Lab 04/18/23 0449 04/19/23 0134  WBC 18.2* 13.4*  NEUTROABS 15.5*  --   HGB 10.2* 10.4*  HCT 31.9* 31.9*  MCV 98.8 98.5  PLT 229 237    Cardiac Enzymes: No results for input(s): "CKTOTAL", "CKMB", "CKMBINDEX", "TROPONINI" in the last 168 hours.  BNP: Invalid input(s): "POCBNP"  CBG: Recent Labs  Lab 04/18/23 1646 04/18/23 2114 04/18/23 2223 04/19/23 0901 04/19/23 1131  GLUCAP 209* 189* 207* 310* 261*    Microbiology: Results for orders placed or performed during the hospital encounter of 04/18/23  Culture, blood (Routine X 2) w Reflex to ID Panel     Status: None (Preliminary result)   Collection Time: 04/18/23  9:42  AM   Specimen: BLOOD  Result Value Ref Range Status   Specimen Description BLOOD BLOOD LEFT HAND  Final   Special Requests   Final    BOTTLES DRAWN AEROBIC AND ANAEROBIC Blood Culture adequate volume   Culture   Final    NO GROWTH < 24 HOURS Performed at [facility], [address] [place], [place] [address]    Report Status PENDING  Incomplete  Culture, blood (Routine X 2) w Reflex to ID Panel     Status: None (Preliminary result)   Collection Time: 04/18/23  9:42 AM   Specimen: BLOOD  Result Value Ref Range Status   Specimen Description BLOOD  LEFT ANTECUBITAL  Final   Special Requests   Final    BOTTLES DRAWN AEROBIC AND ANAEROBIC Blood Culture adequate volume   Culture   Final    NO GROWTH < 24 HOURS Performed at [facility], [address], [place], [place] [address]    Report Status PENDING  Incomplete    Coagulation Studies: Recent Labs    04/18/23 0749  LABPROT 15.2  INR 1.2    Urinalysis: Recent Labs    04/18/23 0908  COLORURINE YELLOW*  LABSPEC 1.008  PHURINE 5.0  GLUCOSEU >=500*  HGBUR NEGATIVE  BILIRUBINUR NEGATIVE  KETONESUR NEGATIVE  PROTEINUR NEGATIVE  NITRITE NEGATIVE  LEUKOCYTESUR NEGATIVE      Imaging: ECHOCARDIOGRAM COMPLETE  Result Date: 04/18/2023    ECHOCARDIOGRAM REPORT   Patient Name:   Mike Decker Date of Exam: 04/18/2023 Medical Rec #:  [id number]            Height:       66.0 in Accession #:    [id number]           Weight:       209.7 lb Date of Birth:  1941/02/07            BSA:          2.040 m Patient Age:    81 years             BP:           128/58 mmHg Patient Gender: M                    HR:           75 bpm. Exam Location:  [facility] Procedure: 2D Echo, Cardiac Doppler and Color Doppler Indications:     Aortic Stenosis  History:         Patient has prior history of Echocardiogram examinations, most                  recent 08/15/2022. CHF, CAD, Acute MI and Angina, PAD, TIA and                  COPD, Aortic Valve Disease, Signs/Symptoms:Chest Pain and                  Shortness of Breath; Risk Factors:Hypertension, Sleep Apnea and                  Dyslipidemia. PVD.  Sonographer:     Mikki Harbor Referring Phys:  [id number] LILY MICHELLE TANG Diagnosing Phys: Marcina Millard MD  Sonographer Comments: Technically difficult study due to poor echo windows and suboptimal subcostal window. Image acquisition challenging due to COPD. IMPRESSIONS  1. Left ventricular ejection fraction, by estimation, is 55 to 60%. The left ventricle has normal  function. The left ventricle has no  regional wall motion abnormalities. Left ventricular diastolic parameters are consistent with Grade I diastolic dysfunction (impaired relaxation).  2. Right ventricular systolic function is normal. The right ventricular size is normal. There is normal pulmonary artery systolic pressure.  3. The mitral valve is normal in structure. Mild to moderate mitral valve regurgitation. No evidence of mitral stenosis.  4. The aortic valve is normal in structure. Aortic valve regurgitation is not visualized. Moderate aortic valve stenosis.  5. The inferior vena cava is normal in size with greater than 50% respiratory variability, suggesting right atrial pressure of 3 mmHg. FINDINGS  Left Ventricle: Left ventricular ejection fraction, by estimation, is 55 to 60%. The left ventricle has normal function. The left ventricle has no regional wall motion abnormalities. The left ventricular internal cavity size was normal in size. There is  no left ventricular hypertrophy. Left ventricular diastolic parameters are consistent with Grade I diastolic dysfunction (impaired relaxation). Right Ventricle: The right ventricular size is normal. No increase in right ventricular wall thickness. Right ventricular systolic function is normal. There is normal pulmonary artery systolic pressure. The tricuspid regurgitant velocity is 2.25 m/s, and  with an assumed right atrial pressure of 8 mmHg, the estimated right ventricular systolic pressure is 28.2 mmHg. Left Atrium: Left atrial size was normal in size. Right Atrium: Right atrial size was normal in size. Pericardium: There is no evidence of pericardial effusion. Mitral Valve: The mitral valve is normal in structure. Mild to moderate mitral valve regurgitation. No evidence of mitral valve stenosis. MV peak gradient, 6.4 mmHg. The mean mitral valve gradient is 3.0 mmHg. Tricuspid Valve: The tricuspid valve is normal in structure. Tricuspid valve regurgitation is not demonstrated. No evidence of  tricuspid stenosis. Aortic Valve: The aortic valve is normal in structure. Aortic valve regurgitation is not visualized. Moderate aortic stenosis is present. Aortic valve mean gradient measures 22.0 mmHg. Aortic valve peak gradient measures 39.4 mmHg. Aortic valve area, by VTI measures 1.27 cm. Pulmonic Valve: The pulmonic valve was normal in structure. Pulmonic valve regurgitation is not visualized. No evidence of pulmonic stenosis. Aorta: The aortic root is normal in size and structure. Venous: The inferior vena cava is normal in size with greater than 50% respiratory variability, suggesting right atrial pressure of 3 mmHg. IAS/Shunts: No atrial level shunt detected by color flow Doppler.  LEFT VENTRICLE PLAX 2D LVIDd:         3.80 cm   Diastology LVIDs:         2.70 cm   LV e' medial:    5.55 cm/s LV PW:         1.30 cm   LV E/e' medial:  16.6 LV IVS:        1.20 cm   LV e' lateral:   8.49 cm/s LVOT diam:     2.10 cm   LV E/e' lateral: 10.9 LV SV:         92 LV SV Index:   45 LVOT Area:     3.46 cm  RIGHT VENTRICLE RV Basal diam:  3.80 cm RV Mid diam:    3.10 cm RV S prime:     16.80 cm/s TAPSE (M-mode): 2.6 cm LEFT ATRIUM             Index        RIGHT ATRIUM           Index LA diam:        4.50 cm  2.21 cm/m   RA Area:     12.20 cm LA Vol (A2C):   80.7 ml 39.55 ml/m  RA Volume:   24.90 ml  12.20 ml/m LA Vol (A4C):   60.5 ml 29.65 ml/m LA Biplane Vol: 72.6 ml 35.58 ml/m  AORTIC VALVE                     PULMONIC VALVE AV Area (Vmax):    1.25 cm      PV Vmax:       1.54 m/s AV Area (Vmean):   1.24 cm      PV Peak grad:  9.5 mmHg AV Area (VTI):     1.27 cm AV Vmax:           314.00 cm/s AV Vmean:          216.667 cm/s AV VTI:            0.726 m AV Peak Grad:      39.4 mmHg AV Mean Grad:      22.0 mmHg LVOT Vmax:         113.00 cm/s LVOT Vmean:        77.300 cm/s LVOT VTI:          0.267 m LVOT/AV VTI ratio: 0.37  AORTA Ao Root diam: 3.70 cm Ao Asc diam:  3.30 cm MITRAL VALVE                TRICUSPID  VALVE MV Area (PHT): 3.15 cm     TR Peak grad:   20.2 mmHg MV Area VTI:   2.39 cm     TR Vmax:        225.00 cm/s MV Peak grad:  6.4 mmHg MV Mean grad:  3.0 mmHg     SHUNTS MV Vmax:       1.26 m/s     Systemic VTI:  0.27 m MV Vmean:      76.0 cm/s    Systemic Diam: 2.10 cm MV Decel Time: 241 msec MV E velocity: 92.20 cm/s MV A velocity: 121.00 cm/s MV E/A ratio:  0.76 Marcina Millard MD Electronically signed by Marcina Millard MD Signature Date/Time: 04/18/2023/5:22:03 PM    Final    DG Chest Portable 1 View  Result Date: 04/18/2023 CLINICAL DATA:  Chest pain EXAM: PORTABLE CHEST 1 VIEW COMPARISON:  12/12/2022 FINDINGS: Borderline heart size that is stable. Mild interstitial coarsening without Kerley line or air bronchogram. No effusion or pneumothorax. Artifact from EKG leads. IMPRESSION: No edema or focal pneumonia. Electronically Signed   By: Tiburcio Pea M.D.   On: 04/18/2023 05:15     Medications:    heparin 1,450 Units/hr (04/19/23 1117)    allopurinol  100 mg Oral Daily   [START ON 04/20/2023] arformoterol  15 mcg Nebulization BID   And   [START ON 04/20/2023] umeclidinium bromide  1 puff Inhalation Daily   aspirin EC  81 mg Oral Daily   citalopram  10 mg Oral Daily   clopidogrel  75 mg Oral Daily   docusate sodium  100 mg Oral BID   furosemide  40 mg Intravenous BID   insulin aspart  0-5 Units Subcutaneous QHS   insulin aspart  0-9 Units Subcutaneous TID WC   insulin aspart  4 Units Subcutaneous TID WC   insulin glargine-yfgn  27 Units Subcutaneous QHS   isosorbide mononitrate  60 mg Oral Daily   metoprolol tartrate  12.5 mg Oral BID  ranolazine  1,000 mg Oral BID   rosuvastatin  40 mg Oral Daily   sodium bicarbonate  650 mg Oral BID   acetaminophen, morphine injection, nitroGLYCERIN, nitroGLYCERIN, ondansetron **OR** ondansetron (ZOFRAN) IV  Assessment/ Plan:  [name] is a 82 y.o.  male with past medical conditions including type 2 diabetes, CVA,  chronic diastolic heart failure, CAD, COPD, and chronic kidney disease stage IV.  Patient presents to the emergency department complaining of chest pain that radiates to his jaw and left arm.  Patient has been admitted for Other chest pain [R07.89] NSTEMI (non-ST elevated myocardial infarction) (HCC) [I21.4]   Acute Kidney Injury on chronic kidney disease stage IV with baseline creatinine 2.56 and GFR of 24 on 12/24/22.  Acute kidney injury secondary to hypoperfusion from cardiac event Chronic kidney disease is secondary to hypertension, diabetes, and CHF.  Patient received gentle hydration in ED.  Management of offending agent should help improve renal function.  Diuresis ordered by cardiology.  Will monitor renal function.  Lab Results  Component Value Date   CREATININE 3.64 (H) 04/19/2023   CREATININE 3.74 (H) 04/18/2023   CREATININE 2.56 (H) 12/24/2022    Intake/Output Summary (Last 24 hours) at 04/19/2023 1359 Last data filed at 04/19/2023 1000 Gross per 24 hour  Intake 271.11 ml  Output 2900 ml  Net -2628.89 ml   2. Anemia of chronic kidney disease Lab Results  Component Value Date   HGB 10.4 (L) 04/19/2023    Hemoglobin within desired range.  3. Secondary Hyperparathyroidism: with outpatient labs: PTH 34, phosphorus 4.1, calcium 10.1 on 02/25/23.   Lab Results  Component Value Date   CALCIUM 8.5 (L) 04/19/2023   PHOS 4.1 05/13/2017    Currently prescribed calcitriol outpatient.  Will continue to monitor bone minerals.  4. Diabetes mellitus type II with chronic kidney disease/renal manifestations: insulin dependent. Home regimen includes NovoLog and Jardiance. Most recent hemoglobin A1c is 7.0 on 04/08/23.    LOS: 1 Shandi Godfrey 5/10/20241:59 PM

## 2023-04-19 NOTE — Progress Notes (Signed)
ANTICOAGULATION CONSULT NOTE  Pharmacy Consult for heparin infusion Indication: chest pain/ACS  Allergies  Allergen Reactions   Gabapentin     Other reaction(s): Other (See Comments) Tremors   Peanut-Containing Drug Products Anaphylaxis   Penicillins Hives and Rash    Has patient had a PCN reaction causing immediate rash, facial/tongue/throat swelling, SOB or lightheadedness with hypotension: Yes Has patient had a PCN reaction causing severe rash involving mucus membranes or skin necrosis: No Has patient had a PCN reaction that required hospitalization No Has patient had a PCN reaction occurring within the last 10 years: No If all of the above answers are "NO", then may proceed with Cephalosporin use.   Bee Venom Swelling   Influenza Vaccines Hives   Inh [Isoniazid] Hives   Jardiance [Empagliflozin] Other (See Comments)    Diarrhea   Kenalog [Triamcinolone Acetonide] Hives   Levaquin [Levofloxacin] Other (See Comments)    Tendon, ligament pain.    Lisinopril     Other reaction(s): Hyperkalemia   Nalfon [Fenoprofen Calcium] Hives   Naproxen    Peanut Oil    Nsaids Rash    Nalfon 600 Nalfon 600    Patient Measurements: Weight: 95.1 kg (209 lb 10.5 oz) Heparin Dosing Weight: 82.7 kg  Vital Signs: Temp: 97.9 F (36.6 C) (05/10 1701) BP: 163/64 (05/10 1701) Pulse Rate: 74 (05/10 1701)  Labs: Recent Labs    04/18/23 0449 04/18/23 0634 04/18/23 0749 04/18/23 1235 04/18/23 1703 04/19/23 0134 04/19/23 1926  HGB 10.2*  --   --   --   --  10.4*  --   HCT 31.9*  --   --   --   --  31.9*  --   PLT 229  --   --   --   --  237  --   APTT  --   --  34  --   --   --   --   LABPROT  --   --  15.2  --   --   --   --   INR  --   --  1.2  --   --   --   --   HEPARINUNFRC  --   --   --   --  0.16* 0.28* 0.42  CREATININE 3.74*  --   --   --   --  3.64*  --   TROPONINIHS 34*   < >  --  454* 801* 782*  --    < > = values in this interval not displayed.     Estimated  Creatinine Clearance: 16.9 mL/min (A) (by C-G formula based on SCr of 3.64 mg/dL (H)).   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 Pt not on anticoagulants per current med list   5/10 1946 HL 0.42, therapeutic @ 1450 units/hr  Goal of Therapy:  Heparin level 0.3-0.7 units/ml Monitor platelets by anticoagulation protocol: Yes   Plan: Heparin therapeutic  Continue heparin infusion at 1450 units/hr Recheck heparin level 8 hours to confirm Daily CBC while on heparin  Sharen Hones, PharmD, BCPS Clinical Pharmacist   04/19/2023,9:00 PM

## 2023-04-19 NOTE — Progress Notes (Signed)
[facility]  ROUNDING NOTE   Subjective:   Mike Decker is an 82 year old male with past medical conditions including type 2 diabetes, CVA, chronic diastolic heart failure, CAD, COPD, and chronic kidney disease stage IV.  Patient presents to the emergency department complaining of chest pain that radiates to his jaw and left arm.  Patient has been admitted for Other chest pain [R07.89] NSTEMI (non-ST elevated myocardial infarction) [facility]) [I21.4]  Patient is known to our practice and followed by Dr. Wynelle Link.    Patient seen laying in bed, currently experiencing chest pain.  Patient is already received 3 nitroglycerin tabs with little to no relief.  Cardiology notified by nursing.  Patient states pain currently in chest and radiating to left arm.   Objective:  Vital signs in last 24 hours:  Temp:  [97.2 F (36.2 C)-99.2 F (37.3 C)] 97.9 F (36.6 C) (05/10 1701) Pulse Rate:  [49-114] 74 (05/10 1701) Resp:  [12-25] 18 (05/10 1701) BP: (97-167)/(50-72) 163/64 (05/10 1701) SpO2:  [92 %-99 %] 99 % (05/10 1701) FiO2 (%):  [28 %] 28 % (05/09 2330)  Weight change:  Filed Weights   04/18/23 0441  Weight: 95.1 kg    Intake/Output: I/O last 3 completed shifts: In: 271.1 [I.V.:271.1] Out: 3400 [Urine:3400]   Intake/Output this shift:  Total I/O In: -  Out: 1000 [Urine:1000]  Physical Exam: General: NAD  Head: Normocephalic, atraumatic. Moist oral mucosal membranes  Eyes: Anicteric  Lungs:  Clear to auscultation  Heart: Regular rate and rhythm  Abdomen:  Soft, nontender, obese  Extremities: No peripheral edema.  Neurologic: Nonfocal, moving all four extremities  Skin: No lesions  Access: None     Basic Metabolic Panel: Recent Labs  Lab 04/18/23 0449 04/19/23 0134  NA 130* 133*  K 4.2 4.1  CL 95* 94*  CO2 21* 25  GLUCOSE 263* 196*  BUN 78* 80*  CREATININE 3.74* 3.64*  CALCIUM 8.3* 8.5*  MG 2.2  --      Liver Function Tests: Recent Labs   Lab 04/18/23 0449 04/19/23 0134  AST 19 39  ALT 13 18  ALKPHOS 49 51  BILITOT 1.0 1.0  PROT 6.2* 6.8  ALBUMIN 3.5 3.8    No results for input(s): "LIPASE", "AMYLASE" in the last 168 hours. No results for input(s): "AMMONIA" in the last 168 hours.  CBC: Recent Labs  Lab 04/18/23 0449 04/19/23 0134  WBC 18.2* 13.4*  NEUTROABS 15.5*  --   HGB 10.2* 10.4*  HCT 31.9* 31.9*  MCV 98.8 98.5  PLT 229 237     Cardiac Enzymes: No results for input(s): "CKTOTAL", "CKMB", "CKMBINDEX", "TROPONINI" in the last 168 hours.  BNP: Invalid input(s): "POCBNP"  CBG: Recent Labs  Lab 04/18/23 2114 04/18/23 2223 04/19/23 0901 04/19/23 1131 04/19/23 1702  GLUCAP 189* 207* 310* 261* 212*     Microbiology: Results for orders placed or performed during the hospital encounter of 04/18/23  Culture, blood (Routine X 2) w Reflex to ID Panel     Status: None (Preliminary result)   Collection Time: 04/18/23  9:42 AM   Specimen: BLOOD  Result Value Ref Range Status   Specimen Description BLOOD BLOOD LEFT HAND  Final   Special Requests   Final    BOTTLES DRAWN AEROBIC AND ANAEROBIC Blood Culture adequate volume   Culture   Final    NO GROWTH < 24 HOURS Performed at [facility], [address], [place], [place] [address]    Report  Status PENDING  Incomplete  Culture, blood (Routine X 2) w Reflex to ID Panel     Status: None (Preliminary result)   Collection Time: 04/18/23  9:42 AM   Specimen: BLOOD  Result Value Ref Range Status   Specimen Description BLOOD LEFT ANTECUBITAL  Final   Special Requests   Final    BOTTLES DRAWN AEROBIC AND ANAEROBIC Blood Culture adequate volume   Culture   Final    NO GROWTH < 24 HOURS Performed at [facility], [address], [place], [place] [address]    Report Status PENDING  Incomplete    Coagulation Studies: Recent Labs    04/18/23 0749  LABPROT 15.2  INR 1.2     Urinalysis: Recent Labs    04/18/23 0908   COLORURINE YELLOW*  LABSPEC 1.008  PHURINE 5.0  GLUCOSEU >=500*  HGBUR NEGATIVE  BILIRUBINUR NEGATIVE  KETONESUR NEGATIVE  PROTEINUR NEGATIVE  NITRITE NEGATIVE  LEUKOCYTESUR NEGATIVE       Imaging: ECHOCARDIOGRAM COMPLETE  Result Date: 04/18/2023    ECHOCARDIOGRAM REPORT   Patient Name:   Mike Decker Date of Exam: 04/18/2023 Medical Rec #:  [id number]            Height:       66.0 in Accession #:    [id number]           Weight:       209.7 lb Date of Birth:  August 22, 1941            BSA:          2.040 m Patient Age:    81 years             BP:           128/58 mmHg Patient Gender: M                    HR:           75 bpm. Exam Location:  [facility] Procedure: 2D Echo, Cardiac Doppler and Color Doppler Indications:     Aortic Stenosis  History:         Patient has prior history of Echocardiogram examinations, most                  recent 08/15/2022. CHF, CAD, Acute MI and Angina, PAD, TIA and                  COPD, Aortic Valve Disease, Signs/Symptoms:Chest Pain and                  Shortness of Breath; Risk Factors:Hypertension, Sleep Apnea and                  Dyslipidemia. PVD.  Sonographer:     Mikki Harbor Referring Phys:  [id number] LILY MICHELLE TANG Diagnosing Phys: Marcina Millard MD  Sonographer Comments: Technically difficult study due to poor echo windows and suboptimal subcostal window. Image acquisition challenging due to COPD. IMPRESSIONS  1. Left ventricular ejection fraction, by estimation, is 55 to 60%. The left ventricle has normal function. The left ventricle has no regional wall motion abnormalities. Left ventricular diastolic parameters are consistent with Grade I diastolic dysfunction (impaired relaxation).  2. Right ventricular systolic function is normal. The right ventricular size is normal. There is normal pulmonary artery systolic pressure.  3. The mitral valve is normal in structure. Mild to moderate mitral valve regurgitation. No evidence of mitral stenosis.  4. The  aortic valve is normal in structure. Aortic valve regurgitation is not visualized. Moderate aortic valve stenosis.  5. The inferior vena cava is normal in size with greater than 50% respiratory variability, suggesting right atrial pressure of 3 mmHg. FINDINGS  Left Ventricle: Left ventricular ejection fraction, by estimation, is 55 to 60%. The left ventricle has normal function. The left ventricle has no regional wall motion abnormalities. The left ventricular internal cavity size was normal in size. There is  no left ventricular hypertrophy. Left ventricular diastolic parameters are consistent with Grade I diastolic dysfunction (impaired relaxation). Right Ventricle: The right ventricular size is normal. No increase in right ventricular wall thickness. Right ventricular systolic function is normal. There is normal pulmonary artery systolic pressure. The tricuspid regurgitant velocity is 2.25 m/s, and  with an assumed right atrial pressure of 8 mmHg, the estimated right ventricular systolic pressure is 28.2 mmHg. Left Atrium: Left atrial size was normal in size. Right Atrium: Right atrial size was normal in size. Pericardium: There is no evidence of pericardial effusion. Mitral Valve: The mitral valve is normal in structure. Mild to moderate mitral valve regurgitation. No evidence of mitral valve stenosis. MV peak gradient, 6.4 mmHg. The mean mitral valve gradient is 3.0 mmHg. Tricuspid Valve: The tricuspid valve is normal in structure. Tricuspid valve regurgitation is not demonstrated. No evidence of tricuspid stenosis. Aortic Valve: The aortic valve is normal in structure. Aortic valve regurgitation is not visualized. Moderate aortic stenosis is present. Aortic valve mean gradient measures 22.0 mmHg. Aortic valve peak gradient measures 39.4 mmHg. Aortic valve area, by VTI measures 1.27 cm. Pulmonic Valve: The pulmonic valve was normal in structure. Pulmonic valve regurgitation is not visualized. No evidence of  pulmonic stenosis. Aorta: The aortic root is normal in size and structure. Venous: The inferior vena cava is normal in size with greater than 50% respiratory variability, suggesting right atrial pressure of 3 mmHg. IAS/Shunts: No atrial level shunt detected by color flow Doppler.  LEFT VENTRICLE PLAX 2D LVIDd:         3.80 cm   Diastology LVIDs:         2.70 cm   LV e' medial:    5.55 cm/s LV PW:         1.30 cm   LV E/e' medial:  16.6 LV IVS:        1.20 cm   LV e' lateral:   8.49 cm/s LVOT diam:     2.10 cm   LV E/e' lateral: 10.9 LV SV:         92 LV SV Index:   45 LVOT Area:     3.46 cm  RIGHT VENTRICLE RV Basal diam:  3.80 cm RV Mid diam:    3.10 cm RV S prime:     16.80 cm/s TAPSE (M-mode): 2.6 cm LEFT ATRIUM             Index        RIGHT ATRIUM           Index LA diam:        4.50 cm 2.21 cm/m   RA Area:     12.20 cm LA Vol (A2C):   80.7 ml 39.55 ml/m  RA Volume:   24.90 ml  12.20 ml/m LA Vol (A4C):   60.5 ml 29.65 ml/m LA Biplane Vol: 72.6 ml 35.58 ml/m  AORTIC VALVE  PULMONIC VALVE AV Area (Vmax):    1.25 cm      PV Vmax:       1.54 m/s AV Area (Vmean):   1.24 cm      PV Peak grad:  9.5 mmHg AV Area (VTI):     1.27 cm AV Vmax:           314.00 cm/s AV Vmean:          216.667 cm/s AV VTI:            0.726 m AV Peak Grad:      39.4 mmHg AV Mean Grad:      22.0 mmHg LVOT Vmax:         113.00 cm/s LVOT Vmean:        77.300 cm/s LVOT VTI:          0.267 m LVOT/AV VTI ratio: 0.37  AORTA Ao Root diam: 3.70 cm Ao Asc diam:  3.30 cm MITRAL VALVE                TRICUSPID VALVE MV Area (PHT): 3.15 cm     TR Peak grad:   20.2 mmHg MV Area VTI:   2.39 cm     TR Vmax:        225.00 cm/s MV Peak grad:  6.4 mmHg MV Mean grad:  3.0 mmHg     SHUNTS MV Vmax:       1.26 m/s     Systemic VTI:  0.27 m MV Vmean:      76.0 cm/s    Systemic Diam: 2.10 cm MV Decel Time: 241 msec MV E velocity: 92.20 cm/s MV A velocity: 121.00 cm/s MV E/A ratio:  0.76 Marcina Millard MD Electronically signed by  Marcina Millard MD Signature Date/Time: 04/18/2023/5:22:03 PM    Final    DG Chest Portable 1 View  Result Date: 04/18/2023 CLINICAL DATA:  Chest pain EXAM: PORTABLE CHEST 1 VIEW COMPARISON:  12/12/2022 FINDINGS: Borderline heart size that is stable. Mild interstitial coarsening without Kerley line or air bronchogram. No effusion or pneumothorax. Artifact from EKG leads. IMPRESSION: No edema or focal pneumonia. Electronically Signed   By: Tiburcio Pea M.D.   On: 04/18/2023 05:15     Medications:    heparin 1,450 Units/hr (04/19/23 1749)    allopurinol  100 mg Oral Daily   [START ON 04/20/2023] arformoterol  15 mcg Nebulization BID   And   [START ON 04/20/2023] umeclidinium bromide  1 puff Inhalation Daily   aspirin EC  81 mg Oral Daily   [START ON 04/20/2023] atorvastatin  80 mg Oral Daily   citalopram  10 mg Oral Daily   clopidogrel  75 mg Oral Daily   docusate sodium  100 mg Oral BID   furosemide  40 mg Intravenous BID   insulin aspart  0-5 Units Subcutaneous QHS   insulin aspart  0-9 Units Subcutaneous TID WC   insulin aspart  4 Units Subcutaneous TID WC   insulin glargine-yfgn  27 Units Subcutaneous QHS   isosorbide mononitrate  60 mg Oral Daily   metoprolol tartrate  12.5 mg Oral BID   [START ON 04/20/2023] ranolazine  500 mg Oral Daily   sodium bicarbonate  650 mg Oral BID   acetaminophen, morphine injection, nitroGLYCERIN, nitroGLYCERIN, ondansetron **OR** ondansetron (ZOFRAN) IV  Assessment/ Plan:  [name] is a 82 y.o.  male with past medical conditions including type 2 diabetes, CVA, chronic diastolic heart failure, CAD,  COPD, and chronic kidney disease stage IV.  Patient presents to the emergency department complaining of chest pain that radiates to his jaw and left arm.  Patient has been admitted for Other chest pain [R07.89] NSTEMI (non-ST elevated myocardial infarction) (HCC) [I21.4]   Acute Kidney Injury on chronic kidney disease stage IV with  baseline creatinine 2.56 and GFR of 24 on 12/24/22.  Acute kidney injury secondary to hypoperfusion from cardiac event Chronic kidney disease is secondary to hypertension, diabetes, and CHF.    Creatinine minimally improved despite diuresis.  Will continue to monitor.  Lab Results  Component Value Date   CREATININE 3.64 (H) 04/19/2023   CREATININE 3.74 (H) 04/18/2023   CREATININE 2.56 (H) 12/24/2022    Intake/Output Summary (Last 24 hours) at 04/19/2023 2025 Last data filed at 04/19/2023 1910 Gross per 24 hour  Intake 130.52 ml  Output 2700 ml  Net -2569.48 ml    2. Anemia of chronic kidney disease Lab Results  Component Value Date   HGB 10.4 (L) 04/19/2023    Hemoglobin at goal.  3. Secondary Hyperparathyroidism: with outpatient labs: PTH 34, phosphorus 4.1, calcium 10.1 on 02/25/23.   Lab Results  Component Value Date   CALCIUM 8.5 (L) 04/19/2023   PHOS 4.1 05/13/2017    Currently prescribed calcitriol outpatient.  Calcium and phosphorus within desired range.  4. Diabetes mellitus type II with chronic kidney disease/renal manifestations: insulin dependent. Home regimen includes NovoLog and Jardiance. Most recent hemoglobin A1c is 7.0 on 04/08/23.   Glucose elevated.  Primary team to manage sliding scale insulin.  5.  NSTEMI, cardiology following.  Heart catheterization deferred due to decreased renal function.  Cardiology following and diuresis underway.     LOS: 1 Koury Roddy 5/10/20248:25 PM

## 2023-04-19 NOTE — Progress Notes (Signed)
ANTICOAGULATION CONSULT NOTE  Pharmacy Consult for heparin infusion Indication: chest pain/ACS  Allergies  Allergen Reactions   Gabapentin     Other reaction(s): Other (See Comments) Tremors   Peanut-Containing Drug Products Anaphylaxis   Penicillins Hives and Rash    Has patient had a PCN reaction causing immediate rash, facial/tongue/throat swelling, SOB or lightheadedness with hypotension: Yes Has patient had a PCN reaction causing severe rash involving mucus membranes or skin necrosis: No Has patient had a PCN reaction that required hospitalization No Has patient had a PCN reaction occurring within the last 10 years: No If all of the above answers are "NO", then may proceed with Cephalosporin use.   Bee Venom Swelling   Influenza Vaccines Hives   Inh [Isoniazid] Hives   Jardiance [Empagliflozin] Other (See Comments)    Diarrhea   Kenalog [Triamcinolone Acetonide] Hives   Levaquin [Levofloxacin] Other (See Comments)    Tendon, ligament pain.    Lisinopril     Other reaction(s): Hyperkalemia   Nalfon [Fenoprofen Calcium] Hives   Naproxen    Peanut Oil    Nsaids Rash    Nalfon 600 Nalfon 600    Patient Measurements: Weight: 95.1 kg (209 lb 10.5 oz) Heparin Dosing Weight: 82.7 kg  Vital Signs: Temp: 97.2 F (36.2 C) (05/10 0410) Temp Source: Axillary (05/10 0410) BP: 167/53 (05/10 0849) Pulse Rate: 51 (05/10 0849)  Labs: Recent Labs    04/18/23 0449 04/18/23 0634 04/18/23 0749 04/18/23 1235 04/18/23 1703 04/19/23 0134  HGB 10.2*  --   --   --   --  10.4*  HCT 31.9*  --   --   --   --  31.9*  PLT 229  --   --   --   --  237  APTT  --   --  34  --   --   --   LABPROT  --   --  15.2  --   --   --   INR  --   --  1.2  --   --   --   HEPARINUNFRC  --   --   --   --  0.16* 0.28*  CREATININE 3.74*  --   --   --   --  3.64*  TROPONINIHS 34*   < >  --  454* 801* 782*   < > = values in this interval not displayed.     Estimated Creatinine Clearance: 16.9  mL/min (A) (by C-G formula based on SCr of 3.64 mg/dL (H)).   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 Pt not on anticoagulants per current med list   Goal of Therapy:  Heparin level 0.3-0.7 units/ml Monitor platelets by anticoagulation protocol: Yes   Plan: heparin level subtherapeutic despite recent rate increase Give heparin 1250 units IV x 1, then increase rate of infusion to 1450 units/hr Recheck heparin level 8 hours after rate change Daily CBC while on heparin  Lowella Bandy, PharmD 04/19/2023,9:52 AM

## 2023-04-19 NOTE — Inpatient Diabetes Management (Signed)
Inpatient Diabetes Program Recommendations  AACE/ADA: New Consensus Statement on Inpatient Glycemic Control  Target Ranges:  Prepandial:   less than 140 mg/dL      Peak postprandial:   less than 180 mg/dL (1-2 hours)      Critically ill patients:  140 - 180 mg/dL    Latest Reference Range & Units 04/18/23 11:44 04/18/23 16:46 04/18/23 21:14 04/18/23 22:23 04/19/23 09:01  Glucose-Capillary 70 - 99 mg/dL 409 (H) 811 (H) 914 (H) 207 (H) 310 (H)   Review of Glycemic Control  Diabetes history: DM2 Outpatient Diabetes medications: Lantus 50 units QHS, Novolog 10 units with breakfast, 30 units with lunch, and 30 units with supper, Jardiance 10 mg daily Current orders for Inpatient glycemic control: Semglee 25 units QHS, Novolog 0-9 units TID with meals, Novolog 0-5 units QHS  Inpatient Diabetes Program Recommendations:    Insulin: Please consider increasing Semglee to 27 units QHS and  ordering Novolog 4 units TID with meals for meal coverage if patient eats at least 50% of meals.  Thanks, Orlando Penner, RN, MSN, CDCES Diabetes Coordinator Inpatient Diabetes Program [phone] (Team Pager from 8am to 5pm)

## 2023-04-19 NOTE — Progress Notes (Signed)
PROGRESS NOTE    Mike Decker  ZOX:[id number] DOB: 06-Dec-1941 DOA: 04/18/2023 PCP: [name], MD  [id number]  LOS: 1 day   Brief hospital course:   Assessment & Plan: Mike Decker is a 82 y.o. male with medical history significant of multiple medical issues including coronary artery disease status post stenting, chronic diastolic heart failure, hypertension, history of CVA, type 2 diabetes, stage IV CKD, gout, COPD presenting with NSTEMI and acute on chronic stage IV CKD.  Patient reports being woke up around 3 AM with severe 10 out of 10 chest pain.    * NSTEMI (non-ST elevated myocardial infarction) (HCC) history of CAD with unstable angina S/p PCI/stent to proximal RCA and proximal circumflex Noted cardiac cath 12/2022 showing multivessel disease  --trop 34 to 801.  Started on heparin gtt --cardio consulted, rec medical management Plan: --cont ASA and statin --cont heparin gtt --cont isosorbide and Ranexa --cardio hesitant to proceed with AKI w/ hx CKD 4   # Acute on chronic HFpEF # Moderate aortic stenosis Clinically hypervolemic with an oxygen requirement, crackles on exam, and a distended abdomen with a ~9 pound weight gain since the weekend in the setting of decreased dose of his regular diuretic with known renal insufficiency as below.  Chest x-ray with some pulmonary edema, BNP only elevated in the 200s. Plan: --cont IV lasix 40 BID -GDMT with metoprolol -Previously on Jardiance prior to admission, hold this for now with AKI.  Addition of MRA, ACE/Arni limited by renal insufficiency  Leukocytosis White count 18.2 on presentation, trended down without abx.  Likely stress reaction.  Acute renal failure superimposed on stage 4 chronic kidney disease (HCC) Baseline creatinine 2.3-2.9 Creatinine 3.74 on presentation. --nephro consulted --monitor while on diuretic  Essential hypertension --cont Lopressor, Imdur --cont IV diuresis  Type II diabetes  mellitus with renal manifestations (HCC) --recent A1c 7.0, well controlled --cont glargine 27u nightly --mealtime 4u TID --SSI  Hyperlipidemia Cont statin    Anemia of chronic renal failure  Gout Cont allopurinol    Chronic obstructive pulmonary disease (HCC) Stable from a resp standpoint  Cont home inhalers    Hx of TIA (transient ischemic attack) --cont ASA, plavix and statin   DVT prophylaxis: [id number] gtt Code Status: Full code  Family Communication:  Level of care: Telemetry Cardiac Dispo:   The patient is from: home Anticipated d/c is to: home Anticipated d/c date is: 2-3 days   Subjective and Interval History:  Had severe chest pain this morning which improved later.   Objective: Vitals:   04/19/23 0849 04/19/23 0900 04/19/23 1000 04/19/23 1701  BP: (!) 167/53 (!) 156/58  (!) 163/64  Pulse: (!) 51 86 (!) 114 74  Resp:  12  18  Temp:    97.9 F (36.6 C)  TempSrc:      SpO2:    99%  Weight:        Intake/Output Summary (Last 24 hours) at 04/19/2023 1748 Last data filed at 04/19/2023 1400 Gross per 24 hour  Intake 271.11 ml  Output 1700 ml  Net -1428.89 ml   Filed Weights   04/18/23 0441  Weight: 95.1 kg    Examination:   Constitutional: NAD, AAOx3 HEENT: conjunctivae and lids normal, EOMI CV: No cyanosis.   RESP: normal respiratory effort, on 2L Abdomen: large Extremities: No effusions, edema in BLE SKIN: warm, dry Neuro: II - XII grossly intact.   Psych: Normal mood and affect.  Appropriate judgement and reason   Data  Reviewed: I have personally reviewed labs and imaging studies  Time spent: 50 minutes  Darlin Priestly, MD [facility] If 7PM-7AM, please contact night-coverage 04/19/2023, 5:48 PM

## 2023-04-19 NOTE — Progress Notes (Signed)
[facility] CLINIC CARDIOLOGY CONSULT NOTE       Patient ID: Mike Decker MRN: [id number] DOB/AGE: 83-Apr-1942 83 y.o.  Admit date: 04/18/2023 Referring Physician Dr. Andrena Mews Primary Physician Dr. Harl Bowie Primary Cardiologist Dr. 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  Cardiology is consulted for further assistance.  Interval History:  -Evaluated early this morning and the patient said "his chest pain was gone."  He has diuresed well with IV Lasix (net -3.1 L) and renal function has marginally improved. -About 1 hour after initial evaluation, the patient developed 6/10 chest pressure radiating to his arm, with eventual improvement after SL nitro x 3, Imdur, and Ranexa -Echo resulted without significant change in EF or degree of aortic stenosis from prior study in September 2023 -Troponins peaked at 801   Review of systems complete and found to be negative unless listed above     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, esphageal   Chronic diastolic CHF (congestive heart failure) (HCC)    Colon polyp    Diabetes mellitus without complication (HCC)    Diverticulosis    Gout    Heart disease    Hemangioma    liver   Hyperlipidemia    Hypertension    Myocardial infarct [facility])    Ocular hypertension    Peripheral vascular disease (HCC)    Skin cancer    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HCC)    Sleep apnea    Vitreoretinal degeneration     Past  Surgical History:  Procedure Laterality Date   CATARACT EXTRACTION  2011, 2012   COLONOSCOPY N/A 12/14/2018   Procedure: COLONOSCOPY;  Surgeon: Pasty Spillers, MD;  Location: [facility] ENDOSCOPY;  Service: Endoscopy;  Laterality: N/A;   CORONARY ANGIOPLASTY WITH STENT PLACEMENT  12/10/2010   CORONARY STENT INTERVENTION N/A 12/29/2019   Procedure: CORONARY STENT INTERVENTION;  Surgeon: Yvonne Kendall, MD;  Location: [facility] INVASIVE CV LAB;  Service: Cardiovascular;  Laterality: N/A;   ESOPHAGOGASTRODUODENOSCOPY N/A 12/14/2018   Procedure: ESOPHAGOGASTRODUODENOSCOPY (EGD);  Surgeon: Pasty Spillers, MD;  Location: [facility] ENDOSCOPY;  Service: Endoscopy;  Laterality: N/A;   ESOPHAGOGASTRODUODENOSCOPY (EGD) WITH PROPOFOL N/A 06/01/2016   Procedure: ESOPHAGOGASTRODUODENOSCOPY (EGD) WITH PROPOFOL;  Surgeon: Scot Jun, MD;  Location: [facility] ENDOSCOPY;  Service: Endoscopy;  Laterality: N/A;   ESOPHAGOGASTRODUODENOSCOPY (EGD) WITH PROPOFOL N/A 01/31/2023   Procedure: ESOPHAGOGASTRODUODENOSCOPY (EGD) WITH PROPOFOL;  Surgeon: Midge Minium, MD;  Location: [facility] ENDOSCOPY;  Service: Endoscopy;  Laterality: N/A;   FEMORAL ARTERY STENT Right    HERNIA REPAIR     x2   INTRAOCULAR LENS INSERTION     LEFT HEART CATH AND CORONARY ANGIOGRAPHY N/A 05/09/2017   Procedure: Left Heart Cath and Coronary Angiography;  Surgeon: Alwyn Pea, MD;  Location: [facility] INVASIVE CV LAB;  Service: Cardiovascular;  Laterality: N/A;   LEFT HEART CATH AND CORONARY ANGIOGRAPHY N/A 12/08/2018   Procedure: LEFT  HEART CATH AND CORONARY ANGIOGRAPHY and possible PCI and stent;  Surgeon: Alwyn Pea, MD;  Location: [facility] INVASIVE CV LAB;  Service: Cardiovascular;  Laterality: N/A;   LEFT HEART CATH AND CORONARY ANGIOGRAPHY N/A 12/29/2019   Procedure: LEFT HEART CATH AND CORONARY ANGIOGRAPHY;  Surgeon: Yvonne Kendall, MD;  Location: [facility] INVASIVE CV LAB;  Service: Cardiovascular;  Laterality: N/A;   LEFT HEART CATH AND  CORONARY ANGIOGRAPHY Left 12/13/2022   Procedure: LEFT HEART CATH AND CORONARY ANGIOGRAPHY;  Surgeon: Alwyn Pea, MD;  Location: [facility] INVASIVE CV LAB;  Service: Cardiovascular;  Laterality: Left;   NOSE SURGERY     submucous resection   PROSTATE SURGERY  12/10/2000   ROTATOR CUFF REPAIR Right     (Not in a hospital admission)  Social History   Socioeconomic History   Marital status: Married    Spouse name: Not on file   Number of children: 2   Years of education: College 3 years   Highest education level: Associate degree: occupational, Scientist, product/process development, or vocational program  Occupational History   Occupation: retired  Tobacco Use   Smoking status: Former    Years: 20    Types: Cigarettes    Quit date: 12/10/1976    Years since quitting: 46.3   Smokeless tobacco: Never  Vaping Use   Vaping Use: Never used  Substance and Sexual Activity   Alcohol use: No   Drug use: No   Sexual activity: Not Currently  Other Topics Concern   Not on file  Social History Narrative   Not on file   Social Determinants of Health   Financial Resource Strain: Low Risk  (09/15/2021)   Overall Financial Resource Strain (CARDIA)    Difficulty of Paying Living Expenses: Not very hard  Food Insecurity: No Food Insecurity (04/18/2023)   Hunger Vital Sign    Worried About Running Out of Food in the Last Year: Never true    Ran Out of Food in the Last Year: Never true  Transportation Needs: No Transportation Needs (04/18/2023)   PRAPARE - Administrator, Civil Service (Medical): No    Lack of Transportation (Non-Medical): No  Physical Activity: Insufficiently Active (09/15/2021)   Exercise Vital Sign    Days of Exercise per Week: 7 days    Minutes of Exercise per Session: 20 min  Stress: No Stress Concern Present (09/15/2021)   [employer] of Occupational Health - Occupational Stress Questionnaire    Feeling of Stress : Not at all  Social Connections: Moderately Isolated (09/15/2021)    Social Connection and Isolation Panel [NHANES]    Frequency of Communication with Friends and Family: Once a week    Frequency of Social Gatherings with Friends and Family: Once a week    Attends Religious Services: More than 4 times per year    Active Member of [employer] or Organizations: No    Attends Banker Meetings: Never    Marital Status: Married  Catering manager Violence: Not At Risk (04/18/2023)   Humiliation, Afraid, Rape, and Kick questionnaire    Fear of Current or Ex-Partner: No    Emotionally Abused: No    Physically Abused: No    Sexually Abused: No    Family History  Problem Relation Age of Onset   Arthritis Mother    Stroke Maternal Grandfather    Breast cancer Neg Hx       Intake/Output Summary (Last 24 hours) at 04/19/2023 1506 Last data filed at 04/19/2023 1400  Gross per 24 hour  Intake 271.11 ml  Output 3400 ml  Net -3128.89 ml     Vitals:   04/19/23 0800 04/19/23 0849 04/19/23 0900 04/19/23 1000  BP: (!) 167/62 (!) 167/53 (!) 156/58   Pulse: 77 (!) 51 86 (!) 114  Resp: 14  12   Temp: 98.7 F (37.1 C)     TempSrc: Oral     SpO2: 92%     Weight:        PHYSICAL EXAM General: Elderly and chronically ill appearing Caucasian male, sitting upright in bed.   HEENT:  Normocephalic and atraumatic. Neck:  No JVD.  Lungs: Normal respiratory effort on oxygen by nasal cannula.  Trace crackles and decreased breath sounds in upper lung fields without appreciable wheezes.   Heart: HRRR . Normal S1 and S2 without gallops or murmurs.  Abdomen: Mildly distended appearing with excess adiposity. Msk: Normal strength and tone for age. Extremities: Warm and well perfused. No clubbing, cyanosis. No peripheral edema.  Neuro: Alert and oriented X 3. Psych:  Answers questions appropriately.   Labs: Basic Metabolic Panel: Recent Labs    04/18/23 0449 04/19/23 0134  NA 130* 133*  K 4.2 4.1  CL 95* 94*  CO2 21* 25  GLUCOSE 263* 196*  BUN 78* 80*   CREATININE 3.74* 3.64*  CALCIUM 8.3* 8.5*  MG 2.2  --     Liver Function Tests: Recent Labs    04/18/23 0449 04/19/23 0134  AST 19 39  ALT 13 18  ALKPHOS 49 51  BILITOT 1.0 1.0  PROT 6.2* 6.8  ALBUMIN 3.5 3.8    No results for input(s): "LIPASE", "AMYLASE" in the last 72 hours. CBC: Recent Labs    04/18/23 0449 04/19/23 0134  WBC 18.2* 13.4*  NEUTROABS 15.5*  --   HGB 10.2* 10.4*  HCT 31.9* 31.9*  MCV 98.8 98.5  PLT 229 237    Cardiac Enzymes: Recent Labs    04/18/23 1235 04/18/23 1703 04/19/23 0134  TROPONINIHS 454* 801* 782*    BNP: Recent Labs    04/18/23 0449  BNP 283.4*    D-Dimer: No results for input(s): "DDIMER" in the last 72 hours. Hemoglobin A1C: No results for input(s): "HGBA1C" in the last 72 hours. Fasting Lipid Panel: No results for input(s): "CHOL", "HDL", "LDLCALC", "TRIG", "CHOLHDL", "LDLDIRECT" in the last 72 hours. Thyroid Function Tests: No results for input(s): "TSH", "T4TOTAL", "T3FREE", "THYROIDAB" in the last 72 hours.  Invalid input(s): "FREET3" Anemia Panel: No results for input(s): "VITAMINB12", "FOLATE", "FERRITIN", "TIBC", "IRON", "RETICCTPCT" in the last 72 hours.   Radiology: ECHOCARDIOGRAM COMPLETE  Result Date: 04/18/2023    ECHOCARDIOGRAM REPORT   Patient Name:   Mike Decker Date of Exam: 04/18/2023 Medical Rec #:  [id number]            Height:       66.0 in Accession #:    [id number]           Weight:       209.7 lb Date of Birth:  04/08/1941            BSA:          2.040 m Patient Age:    81 years             BP:           128/58 mmHg Patient Gender: M  HR:           75 bpm. Exam Location:  [facility] Procedure: 2D Echo, Cardiac Doppler and Color Doppler Indications:     Aortic Stenosis  History:         Patient has prior history of Echocardiogram examinations, most                  recent 08/15/2022. CHF, CAD, Acute MI and Angina, PAD, TIA and                  COPD, Aortic Valve Disease,  Signs/Symptoms:Chest Pain and                  Shortness of Breath; Risk Factors:Hypertension, Sleep Apnea and                  Dyslipidemia. PVD.  Sonographer:     Mikki Harbor Referring Phys:  [id number] Roniyah Llorens MICHELLE Karnell Vanderloop Diagnosing Phys: Marcina Millard MD  Sonographer Comments: Technically difficult study due to poor echo windows and suboptimal subcostal window. Image acquisition challenging due to COPD. IMPRESSIONS  1. Left ventricular ejection fraction, by estimation, is 55 to 60%. The left ventricle has normal function. The left ventricle has no regional wall motion abnormalities. Left ventricular diastolic parameters are consistent with Grade I diastolic dysfunction (impaired relaxation).  2. Right ventricular systolic function is normal. The right ventricular size is normal. There is normal pulmonary artery systolic pressure.  3. The mitral valve is normal in structure. Mild to moderate mitral valve regurgitation. No evidence of mitral stenosis.  4. The aortic valve is normal in structure. Aortic valve regurgitation is not visualized. Moderate aortic valve stenosis.  5. The inferior vena cava is normal in size with greater than 50% respiratory variability, suggesting right atrial pressure of 3 mmHg. FINDINGS  Left Ventricle: Left ventricular ejection fraction, by estimation, is 55 to 60%. The left ventricle has normal function. The left ventricle has no regional wall motion abnormalities. The left ventricular internal cavity size was normal in size. There is  no left ventricular hypertrophy. Left ventricular diastolic parameters are consistent with Grade I diastolic dysfunction (impaired relaxation). Right Ventricle: The right ventricular size is normal. No increase in right ventricular wall thickness. Right ventricular systolic function is normal. There is normal pulmonary artery systolic pressure. The tricuspid regurgitant velocity is 2.25 m/s, and  with an assumed right atrial pressure of 8 mmHg,  the estimated right ventricular systolic pressure is 28.2 mmHg. Left Atrium: Left atrial size was normal in size. Right Atrium: Right atrial size was normal in size. Pericardium: There is no evidence of pericardial effusion. Mitral Valve: The mitral valve is normal in structure. Mild to moderate mitral valve regurgitation. No evidence of mitral valve stenosis. MV peak gradient, 6.4 mmHg. The mean mitral valve gradient is 3.0 mmHg. Tricuspid Valve: The tricuspid valve is normal in structure. Tricuspid valve regurgitation is not demonstrated. No evidence of tricuspid stenosis. Aortic Valve: The aortic valve is normal in structure. Aortic valve regurgitation is not visualized. Moderate aortic stenosis is present. Aortic valve mean gradient measures 22.0 mmHg. Aortic valve peak gradient measures 39.4 mmHg. Aortic valve area, by VTI measures 1.27 cm. Pulmonic Valve: The pulmonic valve was normal in structure. Pulmonic valve regurgitation is not visualized. No evidence of pulmonic stenosis. Aorta: The aortic root is normal in size and structure. Venous: The inferior vena cava is normal in size with greater than 50% respiratory variability, suggesting right atrial pressure of 3  mmHg. IAS/Shunts: No atrial level shunt detected by color flow Doppler.  LEFT VENTRICLE PLAX 2D LVIDd:         3.80 cm   Diastology LVIDs:         2.70 cm   LV e' medial:    5.55 cm/s LV PW:         1.30 cm   LV E/e' medial:  16.6 LV IVS:        1.20 cm   LV e' lateral:   8.49 cm/s LVOT diam:     2.10 cm   LV E/e' lateral: 10.9 LV SV:         92 LV SV Index:   45 LVOT Area:     3.46 cm  RIGHT VENTRICLE RV Basal diam:  3.80 cm RV Mid diam:    3.10 cm RV S prime:     16.80 cm/s TAPSE (M-mode): 2.6 cm LEFT ATRIUM             Index        RIGHT ATRIUM           Index LA diam:        4.50 cm 2.21 cm/m   RA Area:     12.20 cm LA Vol (A2C):   80.7 ml 39.55 ml/m  RA Volume:   24.90 ml  12.20 ml/m LA Vol (A4C):   60.5 ml 29.65 ml/m LA Biplane Vol:  72.6 ml 35.58 ml/m  AORTIC VALVE                     PULMONIC VALVE AV Area (Vmax):    1.25 cm      PV Vmax:       1.54 m/s AV Area (Vmean):   1.24 cm      PV Peak grad:  9.5 mmHg AV Area (VTI):     1.27 cm AV Vmax:           314.00 cm/s AV Vmean:          216.667 cm/s AV VTI:            0.726 m AV Peak Grad:      39.4 mmHg AV Mean Grad:      22.0 mmHg LVOT Vmax:         113.00 cm/s LVOT Vmean:        77.300 cm/s LVOT VTI:          0.267 m LVOT/AV VTI ratio: 0.37  AORTA Ao Root diam: 3.70 cm Ao Asc diam:  3.30 cm MITRAL VALVE                TRICUSPID VALVE MV Area (PHT): 3.15 cm     TR Peak grad:   20.2 mmHg MV Area VTI:   2.39 cm     TR Vmax:        225.00 cm/s MV Peak grad:  6.4 mmHg MV Mean grad:  3.0 mmHg     SHUNTS MV Vmax:       1.26 m/s     Systemic VTI:  0.27 m MV Vmean:      76.0 cm/s    Systemic Diam: 2.10 cm MV Decel Time: 241 msec MV E velocity: 92.20 cm/s MV A velocity: 121.00 cm/s MV E/A ratio:  0.76 Marcina Millard MD Electronically signed by Marcina Millard MD Signature Date/Time: 04/18/2023/5:22:03 PM    Final    DG Chest Portable 1 View  Result Date:  04/18/2023 CLINICAL DATA:  Chest pain EXAM: PORTABLE CHEST 1 VIEW COMPARISON:  12/12/2022 FINDINGS: Borderline heart size that is stable. Mild interstitial coarsening without Kerley line or air bronchogram. No effusion or pneumothorax. Artifact from EKG leads. IMPRESSION: No edema or focal pneumonia. Electronically Signed   By: Tiburcio Pea M.D.   On: 04/18/2023 05:15    LHC 12/13/2022     Prox LAD to Mid LAD lesion is 50% stenosed.   Dist LAD lesion is 75% stenosed.   Dist RCA lesion is 40% stenosed.   Prox RCA to Mid RCA lesion is 40% stenosed.   Ost 1st Mrg to 1st Mrg lesion is 50% stenosed.   Non-stenotic Prox Cx lesion was previously treated.   Non-stenotic Prox RCA lesion was previously treated.   The left ventricular systolic function is normal.   LV end diastolic pressure is normal.   The left ventricular ejection  fraction is 55-65% by visual estimate.   Conclusion Left heart cath right radial approach because of unstable anginal symptoms known coronary disease   Left ventriculogram preserved left ventricular function EF around 55 to 60% mild left ventricular enlargement   Coronaries Left main large minor irregularities  LAD large with diffuse minor irregularities and a 50% mid lesion distal 50-75 Circumflex medium to large minor irregularities widely patent proximal stent OM1 diffusely diseased 50-75 small vessel RCA large proximal stent widely patent 50% mid lesion mixed dominant All vessels with TIMI-3 flow Intervention deferred not indicated  TELEMETRY reviewed by me (LT) 04/19/2023 : Sinus rhythm rate 60s to 80s  EKG reviewed by me: normal sinus rhythm, nonspecific IVCD, and inverted T wave in lead III without reciprocal ST elevations or other acute changes.  Data reviewed by me (LT) 04/19/2023: Last outpatient cardiology note, ED note, admission H&P last 24h vitals tele labs imaging I/O   Principal Problem:   NSTEMI (non-ST elevated myocardial infarction) (HCC) Active Problems:   TIA (transient ischemic attack)   Essential hypertension   Hyperlipidemia   Anemia of chronic renal failure, stage 4 (severe) (HCC)   Chronic diastolic heart failure (HCC)   Chronic obstructive pulmonary disease (HCC)   Gout   Type II diabetes mellitus with renal manifestations (HCC)   Acute renal failure superimposed on stage 4 chronic kidney disease (HCC)   Leukocytosis    ASSESSMENT AND PLAN:  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  Cardiology is consulted for further  assistance.  # NSTEMI Presents with 10/10 chest pain that radiated to his left arm and up into his jaw, EKGs without significant abnormality, but troponin is uptrending to a current peak of 454 with repeats pending.  He has known underlying CAD with patent stents to the RCA and proximal LCx by his last LHC in January without any significant obstructive disease requiring intervention at that time.  Hesitant to proceed immediately with LHC in the setting of significant AKI on his known renal insufficiency as below, suspect some of his discomfort related to acute on chronic CHF as well. -S/p 325 mg aspirin, continue 81 mg aspirin daily -Continue heparin drip for for 48 hours -Change Crestor  40 mg daily to atorvastatin 80 mg -As needed Nitropaste, morphine for anginal pain -Continue isosorbide at 60 mg daily -Continue Ranexa 1000 mg twice daily -Continue metoprolol tartrate 12.5 mg twice daily  -Echo complete without regional wall motion abnormality, change in ejection fraction or degree of aortic stenosis -Will consider ischemic evaluation pending clinical course, hesitant to proceed with AKI w/ hx CKD 4  # Acute on chronic HFpEF # Moderate aortic stenosis On exam today, he remains hypervolemic with an oxygen requirement, improved lung sounds.  Continues to briskly diurese with a net -3.1 L urine output so far -Continue diuresis with IV Lasix 40 mg twice daily for now -GDMT with metoprolol -Previously on Jardiance prior to admission, hold this for now with AKI, addition of MRA, ACE/Arni limited by renal insufficiency  # AKI on CKD 4 BUN/creatinine on admission significantly elevated at 78/3.74 and GFR 15.  Baseline from March 2024 was a creatinine of 3.04 and GFR of 20.  Need to monitor closely with daily BMP  This patient's plan of care was discussed and created with Dr. Darrold Junker and he is in agreement.  Signed: Rebeca Allegra , PA-C 04/19/2023, 3:06 PM [facility]  Cardiology

## 2023-04-20 DIAGNOSIS — I214 Non-ST elevation (NSTEMI) myocardial infarction: Secondary | ICD-10-CM | POA: Diagnosis not present

## 2023-04-20 LAB — BASIC METABOLIC PANEL
Anion gap: 14 (ref 5–15)
BUN: 73 mg/dL — ABNORMAL HIGH (ref 8–23)
CO2: 24 mmol/L (ref 22–32)
Calcium: 8.9 mg/dL (ref 8.9–10.3)
Chloride: 95 mmol/L — ABNORMAL LOW (ref 98–111)
Creatinine, Ser: 3.09 mg/dL — ABNORMAL HIGH (ref 0.61–1.24)
GFR, Estimated: 19 mL/min — ABNORMAL LOW (ref >60)
Glucose, Bld: 173 mg/dL — ABNORMAL HIGH (ref 70–99)
Potassium: 3.8 mmol/L (ref 3.5–5.1)
Sodium: 133 mmol/L — ABNORMAL LOW (ref 135–145)

## 2023-04-20 LAB — CBC
HCT: 32.7 % — ABNORMAL LOW (ref 39.0–52.0)
Hemoglobin: 10.7 g/dL — ABNORMAL LOW (ref 13.0–17.0)
MCH: 31.7 pg (ref 26.0–34.0)
MCHC: 32.7 g/dL (ref 30.0–36.0)
MCV: 96.7 fL (ref 80.0–100.0)
Platelets: 266 10*3/uL (ref 150–400)
RBC: 3.38 MIL/uL — ABNORMAL LOW (ref 4.22–5.81)
RDW: 14.2 % (ref 11.5–15.5)
WBC: 10.9 10*3/uL — ABNORMAL HIGH (ref 4.0–10.5)
nRBC: 0 % (ref 0.0–0.2)

## 2023-04-20 LAB — HEPARIN LEVEL (UNFRACTIONATED): Heparin Unfractionated: 0.51 IU/mL (ref 0.30–0.70)

## 2023-04-20 LAB — MAGNESIUM: Magnesium: 2.6 mg/dL — ABNORMAL HIGH (ref 1.7–2.4)

## 2023-04-20 LAB — GLUCOSE, CAPILLARY
Glucose-Capillary: 200 mg/dL — ABNORMAL HIGH (ref 70–99)
Glucose-Capillary: 245 mg/dL — ABNORMAL HIGH (ref 70–99)
Glucose-Capillary: 235 mg/dL — ABNORMAL HIGH (ref 70–99)
Glucose-Capillary: 272 mg/dL — ABNORMAL HIGH (ref 70–99)

## 2023-04-20 LAB — HEMOGLOBIN A1C
Hgb A1c MFr Bld: 6.2 % — ABNORMAL HIGH (ref 4.8–5.6)
Mean Plasma Glucose: 131.24 mg/dL

## 2023-04-20 MED ORDER — INSULIN ASPART 100 UNIT/ML IJ SOLN
7.0000 [IU] | Freq: Three times a day (TID) | INTRAMUSCULAR | Status: DC
  Administered 2023-04-21 – 2023-04-22 (×6): 7 [IU] via SUBCUTANEOUS
  Filled 2023-04-20 (×6): qty 1

## 2023-04-20 MED ORDER — HEPARIN SODIUM (PORCINE) 5000 UNIT/ML IJ SOLN
5000.0000 [IU] | Freq: Three times a day (TID) | INTRAMUSCULAR | Status: DC
  Administered 2023-04-20 – 2023-04-23 (×9): 5000 [IU] via SUBCUTANEOUS
  Filled 2023-04-20 (×10): qty 1

## 2023-04-20 MED ORDER — METHOCARBAMOL 500 MG PO TABS
500.0000 mg | ORAL_TABLET | Freq: Three times a day (TID) | ORAL | Status: DC
  Administered 2023-04-20 – 2023-04-24 (×12): 500 mg via ORAL
  Filled 2023-04-20 (×12): qty 1

## 2023-04-20 NOTE — Progress Notes (Signed)
[facility] Cardiology  SUBJECTIVE: Patient laying in bed, reports " I am not 100%"   Vitals:   04/19/23 2335 04/20/23 0445 04/20/23 0803 04/20/23 0900  BP: (!) 123/57 (!) 138/51  128/60  Pulse: 69 74  80  Resp: 20 17  19   Temp: 97.8 F (36.6 C) 98.2 F (36.8 C)  98 F (36.7 C)  TempSrc: Oral Oral  Oral  SpO2: 98% 97% 94% 90%  Weight:         Intake/Output Summary (Last 24 hours) at 04/20/2023 0865 Last data filed at 04/20/2023 0300 Gross per 24 hour  Intake --  Output 2500 ml  Net -2500 ml      PHYSICAL EXAM  General: Well developed, well nourished, in no acute distress HEENT:  Normocephalic and atramatic Neck:  No JVD.  Lungs: Clear bilaterally to auscultation and percussion. Heart: HRRR . Normal S1 and S2 without gallops or murmurs.  Abdomen: Bowel sounds are positive, abdomen soft and non-tender  Msk:  Back normal, normal gait. Normal strength and tone for age. Extremities: No clubbing, cyanosis or edema.   Neuro: Alert and oriented X 3. Psych:  Good affect, responds appropriately   LABS: Basic Metabolic Panel: Recent Labs    04/18/23 0449 04/19/23 0134 04/20/23 0344  NA 130* 133* 133*  K 4.2 4.1 3.8  CL 95* 94* 95*  CO2 21* 25 24  GLUCOSE 263* 196* 173*  BUN 78* 80* 73*  CREATININE 3.74* 3.64* 3.09*  CALCIUM 8.3* 8.5* 8.9  MG 2.2  --  2.6*   Liver Function Tests: Recent Labs    04/18/23 0449 04/19/23 0134  AST 19 39  ALT 13 18  ALKPHOS 49 51  BILITOT 1.0 1.0  PROT 6.2* 6.8  ALBUMIN 3.5 3.8   No results for input(s): "LIPASE", "AMYLASE" in the last 72 hours. CBC: Recent Labs    04/18/23 0449 04/19/23 0134 04/20/23 0344  WBC 18.2* 13.4* 10.9*  NEUTROABS 15.5*  --   --   HGB 10.2* 10.4* 10.7*  HCT 31.9* 31.9* 32.7*  MCV 98.8 98.5 96.7  PLT 229 237 266   Cardiac Enzymes: No results for input(s): "CKTOTAL", "CKMB", "CKMBINDEX", "TROPONINI" in the last 72 hours. BNP: Invalid input(s): "POCBNP" D-Dimer: No results for input(s): "DDIMER"  in the last 72 hours. Hemoglobin A1C: Recent Labs    04/19/23 0134  HGBA1C 6.2*   Fasting Lipid Panel: No results for input(s): "CHOL", "HDL", "LDLCALC", "TRIG", "CHOLHDL", "LDLDIRECT" in the last 72 hours. Thyroid Function Tests: No results for input(s): "TSH", "T4TOTAL", "T3FREE", "THYROIDAB" in the last 72 hours.  Invalid input(s): "FREET3" Anemia Panel: No results for input(s): "VITAMINB12", "FOLATE", "FERRITIN", "TIBC", "IRON", "RETICCTPCT" in the last 72 hours.  ECHOCARDIOGRAM COMPLETE  Result Date: 04/18/2023    ECHOCARDIOGRAM REPORT   Patient Name:   Mike Decker Date of Exam: 04/18/2023 Medical Rec #:  [id number]            Height:       66.0 in Accession #:    [id number]           Weight:       209.7 lb Date of Birth:  [birth year]            BSA:          2.040 m Patient Age:    81 years             BP:           128/58 mmHg Patient  Gender: M                    HR:           75 bpm. Exam Location:  [facility] Procedure: 2D Echo, Cardiac Doppler and Color Doppler Indications:     Aortic Stenosis  History:         Patient has prior history of Echocardiogram examinations, most                  recent 08/15/2022. CHF, CAD, Acute MI and Angina, PAD, TIA and                  COPD, Aortic Valve Disease, Signs/Symptoms:Chest Pain and                  Shortness of Breath; Risk Factors:Hypertension, Sleep Apnea and                  Dyslipidemia. PVD.  Sonographer:     Mikki Harbor Referring Phys:  [id number] LILY MICHELLE TANG Diagnosing Phys: Marcina Millard MD  Sonographer Comments: Technically difficult study due to poor echo windows and suboptimal subcostal window. Image acquisition challenging due to COPD. IMPRESSIONS  1. Left ventricular ejection fraction, by estimation, is 55 to 60%. The left ventricle has normal function. The left ventricle has no regional wall motion abnormalities. Left ventricular diastolic parameters are consistent with Grade I diastolic dysfunction (impaired  relaxation).  2. Right ventricular systolic function is normal. The right ventricular size is normal. There is normal pulmonary artery systolic pressure.  3. The mitral valve is normal in structure. Mild to moderate mitral valve regurgitation. No evidence of mitral stenosis.  4. The aortic valve is normal in structure. Aortic valve regurgitation is not visualized. Moderate aortic valve stenosis.  5. The inferior vena cava is normal in size with greater than 50% respiratory variability, suggesting right atrial pressure of 3 mmHg. FINDINGS  Left Ventricle: Left ventricular ejection fraction, by estimation, is 55 to 60%. The left ventricle has normal function. The left ventricle has no regional wall motion abnormalities. The left ventricular internal cavity size was normal in size. There is  no left ventricular hypertrophy. Left ventricular diastolic parameters are consistent with Grade I diastolic dysfunction (impaired relaxation). Right Ventricle: The right ventricular size is normal. No increase in right ventricular wall thickness. Right ventricular systolic function is normal. There is normal pulmonary artery systolic pressure. The tricuspid regurgitant velocity is 2.25 m/s, and  with an assumed right atrial pressure of 8 mmHg, the estimated right ventricular systolic pressure is 28.2 mmHg. Left Atrium: Left atrial size was normal in size. Right Atrium: Right atrial size was normal in size. Pericardium: There is no evidence of pericardial effusion. Mitral Valve: The mitral valve is normal in structure. Mild to moderate mitral valve regurgitation. No evidence of mitral valve stenosis. MV peak gradient, 6.4 mmHg. The mean mitral valve gradient is 3.0 mmHg. Tricuspid Valve: The tricuspid valve is normal in structure. Tricuspid valve regurgitation is not demonstrated. No evidence of tricuspid stenosis. Aortic Valve: The aortic valve is normal in structure. Aortic valve regurgitation is not visualized. Moderate aortic  stenosis is present. Aortic valve mean gradient measures 22.0 mmHg. Aortic valve peak gradient measures 39.4 mmHg. Aortic valve area, by VTI measures 1.27 cm. Pulmonic Valve: The pulmonic valve was normal in structure. Pulmonic valve regurgitation is not visualized. No evidence of pulmonic stenosis. Aorta: The aortic root is normal in size and structure.  Venous: The inferior vena cava is normal in size with greater than 50% respiratory variability, suggesting right atrial pressure of 3 mmHg. IAS/Shunts: No atrial level shunt detected by color flow Doppler.  LEFT VENTRICLE PLAX 2D LVIDd:         3.80 cm   Diastology LVIDs:         2.70 cm   LV e' medial:    5.55 cm/s LV PW:         1.30 cm   LV E/e' medial:  16.6 LV IVS:        1.20 cm   LV e' lateral:   8.49 cm/s LVOT diam:     2.10 cm   LV E/e' lateral: 10.9 LV SV:         92 LV SV Index:   45 LVOT Area:     3.46 cm  RIGHT VENTRICLE RV Basal diam:  3.80 cm RV Mid diam:    3.10 cm RV S prime:     16.80 cm/s TAPSE (M-mode): 2.6 cm LEFT ATRIUM             Index        RIGHT ATRIUM           Index LA diam:        4.50 cm 2.21 cm/m   RA Area:     12.20 cm LA Vol (A2C):   80.7 ml 39.55 ml/m  RA Volume:   24.90 ml  12.20 ml/m LA Vol (A4C):   60.5 ml 29.65 ml/m LA Biplane Vol: 72.6 ml 35.58 ml/m  AORTIC VALVE                     PULMONIC VALVE AV Area (Vmax):    1.25 cm      PV Vmax:       1.54 m/s AV Area (Vmean):   1.24 cm      PV Peak grad:  9.5 mmHg AV Area (VTI):     1.27 cm AV Vmax:           314.00 cm/s AV Vmean:          216.667 cm/s AV VTI:            0.726 m AV Peak Grad:      39.4 mmHg AV Mean Grad:      22.0 mmHg LVOT Vmax:         113.00 cm/s LVOT Vmean:        77.300 cm/s LVOT VTI:          0.267 m LVOT/AV VTI ratio: 0.37  AORTA Ao Root diam: 3.70 cm Ao Asc diam:  3.30 cm MITRAL VALVE                TRICUSPID VALVE MV Area (PHT): 3.15 cm     TR Peak grad:   20.2 mmHg MV Area VTI:   2.39 cm     TR Vmax:        225.00 cm/s MV Peak grad:  6.4 mmHg  MV Mean grad:  3.0 mmHg     SHUNTS MV Vmax:       1.26 m/s     Systemic VTI:  0.27 m MV Vmean:      76.0 cm/s    Systemic Diam: 2.10 cm MV Decel Time: 241 msec MV E velocity: 92.20 cm/s MV A velocity: 121.00 cm/s MV E/A ratio:  0.76 Marcina Millard MD Electronically signed by Lyn Hollingshead  Romayne Ticas MD Signature Date/Time: 04/18/2023/5:22:03 PM    Final      Echo EF 55-60%, moderate aortic stenosis (AVA 1.27 cm, peak velocity 3.14 m/s, peak gradient 39.4 mmHg, mean gradient 22.0 mmHg) 04/18/2023  TELEMETRY: Sinus rhythm 88 bpm:  ASSESSMENT AND PLAN:  Principal Problem:   NSTEMI (non-ST elevated myocardial infarction) (HCC) Active Problems:   TIA (transient ischemic attack)   Essential hypertension   Hyperlipidemia   Anemia of chronic renal failure, stage 4 (severe) (HCC)   Chronic diastolic heart failure (HCC)   Chronic obstructive pulmonary disease (HCC)   Gout   Type II diabetes mellitus with renal manifestations (HCC)   Acute renal failure superimposed on stage 4 chronic kidney disease (HCC)   Leukocytosis    1.  NSTEMI (90, 454, 801, 782) with known underlying CAD, with cardiac cath/03/2023 revealing patent stents proximal left circumflex and RCA, on heparin for 48 hours, with normal LV function on 2D echocardiogram 2.  Acute on chronic HFpEF, EF 55-60%, on furosemide 40 mg IV twice daily, with continued good diuresis, net output 2500 cc over last 24 hours, good medical management limited by CKD stage IV 3.  Moderate aortic stenosis (AVA 1.27, peak velocity 3.14 m/s, peak gradient 39.4 mmHg, mean gradient 22.0 mmHg) 4.  AKI on CKD stage IV, BUN and creatinine 73 and 3.09, respectively with GFR 19, improved with diuresis  Recommendations  1.  Agree with current therapy 2.  Continue diuresis 3.  Continue to monitor renal status 4.  DC heparin today 5.  Continue metoprolol tartrate 6.  Continue isosorbide mononitrate and Ranexa 7.  Defer cardiac catheterization at this time,  especially in light of underlying chronic kidney disease, and patient undergone recent cardiac catheterization 12/13/2022 which revealed patent coronary stents   Marcina Millard, MD, PhD, [facility] 04/20/2023 9:29 AM

## 2023-04-20 NOTE — Progress Notes (Signed)
[facility]  PROGRESS NOTE   Subjective:   Comfortable.  Objective:  Vital signs: Blood pressure (!) 114/54, pulse 78, temperature 98.1 F (36.7 C), temperature source Oral, resp. rate 14, weight 95.1 kg, SpO2 98 %.  Intake/Output Summary (Last 24 hours) at 04/20/2023 1356 Last data filed at 04/20/2023 1100 Gross per 24 hour  Intake 683.77 ml  Output 2600 ml  Net -1916.23 ml   Filed Weights   04/18/23 0441  Weight: 95.1 kg     Physical Exam: General:  No acute distress  Head:  Normocephalic, atraumatic. Moist oral mucosal membranes  Eyes:  Anicteric  Neck:  Supple  Lungs:   Clear to auscultation, normal effort  Heart:  S1S2 no rubs  Abdomen:   Soft, nontender, bowel sounds present  Extremities:  peripheral edema.  Neurologic:  Awake, alert, following commands  Skin:  No lesions  Access:     Basic Metabolic Panel: Recent Labs  Lab 04/18/23 0449 04/19/23 0134 04/20/23 0344  NA 130* 133* 133*  K 4.2 4.1 3.8  CL 95* 94* 95*  CO2 21* 25 24  GLUCOSE 263* 196* 173*  BUN 78* 80* 73*  CREATININE 3.74* 3.64* 3.09*  CALCIUM 8.3* 8.5* 8.9  MG 2.2  --  2.6*   GFR: Estimated Creatinine Clearance: 19.9 mL/min (A) (by C-G formula based on SCr of 3.09 mg/dL (H)).  Liver Function Tests: Recent Labs  Lab 04/18/23 0449 04/19/23 0134  AST 19 39  ALT 13 18  ALKPHOS 49 51  BILITOT 1.0 1.0  PROT 6.2* 6.8  ALBUMIN 3.5 3.8   No results for input(s): "LIPASE", "AMYLASE" in the last 168 hours. No results for input(s): "AMMONIA" in the last 168 hours.  CBC: Recent Labs  Lab 04/18/23 0449 04/19/23 0134 04/20/23 0344  WBC 18.2* 13.4* 10.9*  NEUTROABS 15.5*  --   --   HGB 10.2* 10.4* 10.7*  HCT 31.9* 31.9* 32.7*  MCV 98.8 98.5 96.7  PLT 229 237 266     HbA1C: Hemoglobin A1C  Date/Time Value Ref Range Status  07/18/2021 12:00 AM 8.3  Final   Hgb A1c MFr Bld  Date/Time Value Ref Range Status  04/19/2023 01:34 AM 6.2 (H) 4.8 - 5.6 % Final     Comment:    (NOTE) Pre diabetes:          5.7%-6.4%  Diabetes:              >6.4%  Glycemic control for   <7.0% adults with diabetes   08/15/2022 09:56 AM 7.5 (H) 4.8 - 5.6 % Final    Comment:    (NOTE) Pre diabetes:          5.7%-6.4%  Diabetes:              >6.4%  Glycemic control for   <7.0% adults with diabetes     Urinalysis: Recent Labs    04/18/23 0908  COLORURINE YELLOW*  LABSPEC 1.008  PHURINE 5.0  GLUCOSEU >=500*  HGBUR NEGATIVE  BILIRUBINUR NEGATIVE  KETONESUR NEGATIVE  PROTEINUR NEGATIVE  NITRITE NEGATIVE  LEUKOCYTESUR NEGATIVE      Imaging: ECHOCARDIOGRAM COMPLETE  Result Date: 04/18/2023    ECHOCARDIOGRAM REPORT   Patient Name:   BOBAK PYNES Date of Exam: 04/18/2023 Medical Rec #:  [id number]            Height:       66.0 in Accession #:    [id number]  Weight:       209.7 lb Date of Birth:  02-Feb-1941            BSA:          2.040 m Patient Age:    82 years             BP:           128/58 mmHg Patient Gender: M                    HR:           75 bpm. Exam Location:  [facility] Procedure: 2D Echo, Cardiac Doppler and Color Doppler Indications:     Aortic Stenosis  History:         Patient has prior history of Echocardiogram examinations, most                  recent 08/15/2022. CHF, CAD, Acute MI and Angina, PAD, TIA and                  COPD, Aortic Valve Disease, Signs/Symptoms:Chest Pain and                  Shortness of Breath; Risk Factors:Hypertension, Sleep Apnea and                  Dyslipidemia. PVD.  Sonographer:     Mikki Harbor Referring Phys:  [id number] LILY MICHELLE TANG Diagnosing Phys: Marcina Millard MD  Sonographer Comments: Technically difficult study due to poor echo windows and suboptimal subcostal window. Image acquisition challenging due to COPD. IMPRESSIONS  1. Left ventricular ejection fraction, by estimation, is 55 to 60%. The left ventricle has normal function. The left ventricle has no regional wall motion abnormalities.  Left ventricular diastolic parameters are consistent with Grade I diastolic dysfunction (impaired relaxation).  2. Right ventricular systolic function is normal. The right ventricular size is normal. There is normal pulmonary artery systolic pressure.  3. The mitral valve is normal in structure. Mild to moderate mitral valve regurgitation. No evidence of mitral stenosis.  4. The aortic valve is normal in structure. Aortic valve regurgitation is not visualized. Moderate aortic valve stenosis.  5. The inferior vena cava is normal in size with greater than 50% respiratory variability, suggesting right atrial pressure of 3 mmHg. FINDINGS  Left Ventricle: Left ventricular ejection fraction, by estimation, is 55 to 60%. The left ventricle has normal function. The left ventricle has no regional wall motion abnormalities. The left ventricular internal cavity size was normal in size. There is  no left ventricular hypertrophy. Left ventricular diastolic parameters are consistent with Grade I diastolic dysfunction (impaired relaxation). Right Ventricle: The right ventricular size is normal. No increase in right ventricular wall thickness. Right ventricular systolic function is normal. There is normal pulmonary artery systolic pressure. The tricuspid regurgitant velocity is 2.25 m/s, and  with an assumed right atrial pressure of 8 mmHg, the estimated right ventricular systolic pressure is 28.2 mmHg. Left Atrium: Left atrial size was normal in size. Right Atrium: Right atrial size was normal in size. Pericardium: There is no evidence of pericardial effusion. Mitral Valve: The mitral valve is normal in structure. Mild to moderate mitral valve regurgitation. No evidence of mitral valve stenosis. MV peak gradient, 6.4 mmHg. The mean mitral valve gradient is 3.0 mmHg. Tricuspid Valve: The tricuspid valve is normal in structure. Tricuspid valve regurgitation is not demonstrated. No evidence of tricuspid stenosis. Aortic Valve: The  aortic valve is normal in structure. Aortic valve regurgitation is not visualized. Moderate aortic stenosis is present. Aortic valve mean gradient measures 22.0 mmHg. Aortic valve peak gradient measures 39.4 mmHg. Aortic valve area, by VTI measures 1.27 cm. Pulmonic Valve: The pulmonic valve was normal in structure. Pulmonic valve regurgitation is not visualized. No evidence of pulmonic stenosis. Aorta: The aortic root is normal in size and structure. Venous: The inferior vena cava is normal in size with greater than 50% respiratory variability, suggesting right atrial pressure of 3 mmHg. IAS/Shunts: No atrial level shunt detected by color flow Doppler.  LEFT VENTRICLE PLAX 2D LVIDd:         3.80 cm   Diastology LVIDs:         2.70 cm   LV e' medial:    5.55 cm/s LV PW:         1.30 cm   LV E/e' medial:  16.6 LV IVS:        1.20 cm   LV e' lateral:   8.49 cm/s LVOT diam:     2.10 cm   LV E/e' lateral: 10.9 LV SV:         92 LV SV Index:   45 LVOT Area:     3.46 cm  RIGHT VENTRICLE RV Basal diam:  3.80 cm RV Mid diam:    3.10 cm RV S prime:     16.80 cm/s TAPSE (M-mode): 2.6 cm LEFT ATRIUM             Index        RIGHT ATRIUM           Index LA diam:        4.50 cm 2.21 cm/m   RA Area:     12.20 cm LA Vol (A2C):   80.7 ml 39.55 ml/m  RA Volume:   24.90 ml  12.20 ml/m LA Vol (A4C):   60.5 ml 29.65 ml/m LA Biplane Vol: 72.6 ml 35.58 ml/m  AORTIC VALVE                     PULMONIC VALVE AV Area (Vmax):    1.25 cm      PV Vmax:       1.54 m/s AV Area (Vmean):   1.24 cm      PV Peak grad:  9.5 mmHg AV Area (VTI):     1.27 cm AV Vmax:           314.00 cm/s AV Vmean:          216.667 cm/s AV VTI:            0.726 m AV Peak Grad:      39.4 mmHg AV Mean Grad:      22.0 mmHg LVOT Vmax:         113.00 cm/s LVOT Vmean:        77.300 cm/s LVOT VTI:          0.267 m LVOT/AV VTI ratio: 0.37  AORTA Ao Root diam: 3.70 cm Ao Asc diam:  3.30 cm MITRAL VALVE                TRICUSPID VALVE MV Area (PHT): 3.15 cm     TR Peak  grad:   20.2 mmHg MV Area VTI:   2.39 cm     TR Vmax:        225.00 cm/s MV Peak grad:  6.4 mmHg MV Mean grad:  3.0 mmHg     SHUNTS MV Vmax:       1.26 m/s     Systemic VTI:  0.27 m MV Vmean:      76.0 cm/s    Systemic Diam: 2.10 cm MV Decel Time: 241 msec MV E velocity: 92.20 cm/s MV A velocity: 121.00 cm/s MV E/A ratio:  0.76 Marcina Millard MD Electronically signed by Marcina Millard MD Signature Date/Time: 04/18/2023/5:22:03 PM    Final      Medications:     allopurinol  100 mg Oral Daily   arformoterol  15 mcg Nebulization BID   And   umeclidinium bromide  1 puff Inhalation Daily   aspirin EC  81 mg Oral Daily   atorvastatin  80 mg Oral Daily   citalopram  10 mg Oral Daily   clopidogrel  75 mg Oral Daily   docusate sodium  100 mg Oral BID   furosemide  40 mg Intravenous BID   insulin aspart  0-5 Units Subcutaneous QHS   insulin aspart  0-9 Units Subcutaneous TID WC   insulin aspart  4 Units Subcutaneous TID WC   insulin glargine-yfgn  27 Units Subcutaneous QHS   isosorbide mononitrate  60 mg Oral Daily   metoprolol tartrate  12.5 mg Oral BID   ranolazine  500 mg Oral Daily   sodium bicarbonate  650 mg Oral BID    Assessment/ Plan:     82 y.o.  male with past medical conditions including type 2 diabetes, CVA, chronic diastolic heart failure, CAD, COPD, and chronic kidney disease stage IV.  Patient presents to the emergency department complaining of chest pain that radiates to his jaw and left arm.  Patient has been admitted for Other chest pain [R07.89] NSTEMI (non-ST elevated myocardial infarction) (HCC) [I21.4]  #1: Acute kidney injury: Renal indices are slowly but steadily improving.  Continue present treatment.  #2: Anemia of chronic kidney disease: Presently stable.  #3: Diabetes: Will continue the insulin as ordered.  #4: Metabolic acidosis: Will continue the sodium bicarbonate at 650 mg twice a day.  #5: Hypertension/coronary artery disease/congestive heart  failure.: Continue the metoprolol and Imdur as ordered by cardiology.  Also continue the furosemide at 40 mg twice a day.  Will monitor closely.   LOS: 2 Lorain Childes, MD [facility] 5/11/20241:56 PM

## 2023-04-20 NOTE — Progress Notes (Signed)
PROGRESS NOTE    Mike Decker  ZOX:[id number] DOB: [birth year] DOA: 04/18/2023 PCP: [name], MD  [id number]  LOS: 2 days   Brief hospital course:   Assessment & Plan: Mike Decker is a 82 y.o. male with medical history significant of multiple medical issues including coronary artery disease status post stenting, chronic diastolic heart failure, hypertension, history of CVA, type 2 diabetes, stage IV CKD, gout, COPD presenting with NSTEMI and acute on chronic stage IV CKD.  Patient reports being woke up around 3 AM with severe 10 out of 10 chest pain.    * NSTEMI (non-ST elevated myocardial infarction) (HCC) history of CAD with unstable angina S/p PCI/stent to proximal RCA and proximal circumflex Noted cardiac cath 12/2022 showing multivessel disease  --trop 34 to 801.  S/p 48 hours of heparin gtt Plan: --cont ASA and statin --cont isosorbide and Ranexa --cardio rec medical management. --cardio hesitant to proceed with AKI w/ hx CKD 4   # Acute on chronic HFpEF # Moderate aortic stenosis Clinically hypervolemic with an oxygen requirement, crackles on exam, and a distended abdomen with a ~9 pound weight gain since the weekend in the setting of decreased dose of his regular diuretic with known renal insufficiency as below.  Chest x-ray with some pulmonary edema, BNP only elevated in the 200s. Plan: --cont IV lasix 40 BID -GDMT with metoprolol -Previously on Jardiance prior to admission, hold this for now with AKI.  Addition of MRA, ACE/Arni limited by renal insufficiency  Leukocytosis White count 18.2 on presentation, trended down without abx.  Likely stress reaction.  Acute renal failure superimposed on stage 4 chronic kidney disease (HCC) Baseline creatinine 2.3-2.9 Creatinine 3.74 on presentation. --nephro consulted --monitor while on diuretic  Metabolic acidosis --cont Na bicarb  Essential hypertension --cont Lopressor, Imdur --cont IV  diuresis  Type II diabetes mellitus with renal manifestations (HCC) --recent A1c 7.0, well controlled --cont glargine 27u nightly --increase mealtime to 7u TID --SSI  Hyperlipidemia Cont statin    Anemia of chronic renal failure  Gout Cont allopurinol    Chronic obstructive pulmonary disease (HCC) Stable from a resp standpoint  Cont home inhalers    Hx of TIA (transient ischemic attack) --cont ASA, plavix and statin   DVT prophylaxis: Heparin SQ Code Status: Full code  Family Communication:  Level of care: Telemetry Cardiac Dispo:   The patient is from: home Anticipated d/c is to: home Anticipated d/c date is: 2-3 days   Subjective and Interval History:  Chest pain improved, but pt reported sciatica pain acting up.  Good urine output.   Objective: Vitals:   04/20/23 1047 04/20/23 1051 04/20/23 1700 04/20/23 1926  BP:  (!) 114/54 110/60 (!) 105/54  Pulse:  78 76 83  Resp: 13 14 12 18   Temp:  98.1 F (36.7 C) 98 F (36.7 C) 98.5 F (36.9 C)  TempSrc:  Oral Oral Oral  SpO2: 94% 98% 96% 95%  Weight:        Intake/Output Summary (Last 24 hours) at 04/20/2023 1938 Last data filed at 04/20/2023 1436 Gross per 24 hour  Intake 1253.77 ml  Output 1600 ml  Net -346.23 ml   Filed Weights   04/18/23 0441  Weight: 95.1 kg    Examination:   Constitutional: NAD, AAOx3 HEENT: conjunctivae and lids normal, EOMI CV: No cyanosis.   RESP: normal respiratory effort, on RA Extremities: No effusions, edema in BLE SKIN: warm, dry Neuro: II - XII grossly intact.   Psych:  Normal mood and affect.  Appropriate judgement and reason   Data Reviewed: I have personally reviewed labs and imaging studies  Time spent: 35 minutes  Darlin Priestly, MD [facility] If 7PM-7AM, please contact night-coverage 04/20/2023, 7:38 PM

## 2023-04-20 NOTE — Progress Notes (Signed)
SATURATION QUALIFICATIONS: (This note is used to comply with regulatory documentation for home oxygen)  Patient Saturations on Room Air at Rest = 98%  Patient Saturations on Room Air while Ambulating = 94%  Patient Saturations on 0 Liters of oxygen while Ambulating = 94%  Please briefly explain why patient needs home oxygen: 

## 2023-04-20 NOTE — Progress Notes (Signed)
ANTICOAGULATION CONSULT NOTE  Pharmacy Consult for heparin infusion Indication: chest pain/ACS  Allergies  Allergen Reactions   Gabapentin     Other reaction(s): Other (See Comments) Tremors   Peanut-Containing Drug Products Anaphylaxis   Penicillins Hives and Rash    Has patient had a PCN reaction causing immediate rash, facial/tongue/throat swelling, SOB or lightheadedness with hypotension: Yes Has patient had a PCN reaction causing severe rash involving mucus membranes or skin necrosis: No Has patient had a PCN reaction that required hospitalization No Has patient had a PCN reaction occurring within the last 10 years: No If all of the above answers are "NO", then may proceed with Cephalosporin use.   Bee Venom Swelling   Influenza Vaccines Hives   Inh [Isoniazid] Hives   Jardiance [Empagliflozin] Other (See Comments)    Diarrhea   Kenalog [Triamcinolone Acetonide] Hives   Levaquin [Levofloxacin] Other (See Comments)    Tendon, ligament pain.    Lisinopril     Other reaction(s): Hyperkalemia   Nalfon [Fenoprofen Calcium] Hives   Naproxen    Peanut Oil    Nsaids Rash    Nalfon 600 Nalfon 600    Patient Measurements: Weight: 95.1 kg (209 lb 10.5 oz) Heparin Dosing Weight: 82.7 kg  Vital Signs: Temp: 97.8 F (36.6 C) (05/10 2335) Temp Source: Oral (05/10 2335) BP: 123/57 (05/10 2335) Pulse Rate: 69 (05/10 2335)  Labs: Recent Labs    04/18/23 0449 04/18/23 1610 04/18/23 0749 04/18/23 1235 04/18/23 1703 04/18/23 1703 04/19/23 0134 04/19/23 1926 04/20/23 0344  HGB 10.2*  --   --   --   --   --  10.4*  --  10.7*  HCT 31.9*  --   --   --   --   --  31.9*  --  32.7*  PLT 229  --   --   --   --   --  237  --  266  APTT  --   --  34  --   --   --   --   --   --   LABPROT  --   --  15.2  --   --   --   --   --   --   INR  --   --  1.2  --   --   --   --   --   --   HEPARINUNFRC  --   --   --   --  0.16*   < > 0.28* 0.42 0.51  CREATININE 3.74*  --   --   --   --    --  3.64*  --  3.09*  TROPONINIHS 34*   < >  --  454* 801*  --  782*  --   --    < > = values in this interval not displayed.     Estimated Creatinine Clearance: 19.9 mL/min (A) (by C-G formula based on SCr of 3.09 mg/dL (H)).   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 Pt not on anticoagulants per current med list   5/10 1946 HL 0.42, therapeutic @ 1450 units/hr 5/11 0344 HL  0.51, therapeutic X 2   Goal of Therapy:  Heparin level 0.3-0.7 units/ml Monitor platelets by anticoagulation protocol: Yes   Plan: Heparin therapeutic X 2  Continue heparin infusion at 1450 units/hr Recheck heparin level on 5/12 with AM labs.  Daily CBC while on heparin  Natalia Wittmeyer D, PharmD Clinical Pharmacist   04/20/2023,4:23 AM

## 2023-04-21 DIAGNOSIS — I214 Non-ST elevation (NSTEMI) myocardial infarction: Secondary | ICD-10-CM | POA: Diagnosis not present

## 2023-04-21 DIAGNOSIS — I4891 Unspecified atrial fibrillation: Secondary | ICD-10-CM

## 2023-04-21 LAB — CBC
HCT: 32.9 % — ABNORMAL LOW (ref 39.0–52.0)
Hemoglobin: 11 g/dL — ABNORMAL LOW (ref 13.0–17.0)
MCH: 32.3 pg (ref 26.0–34.0)
MCHC: 33.4 g/dL (ref 30.0–36.0)
MCV: 96.5 fL (ref 80.0–100.0)
Platelets: 300 10*3/uL (ref 150–400)
RBC: 3.41 MIL/uL — ABNORMAL LOW (ref 4.22–5.81)
RDW: 14 % (ref 11.5–15.5)
WBC: 10.7 10*3/uL — ABNORMAL HIGH (ref 4.0–10.5)
nRBC: 0 % (ref 0.0–0.2)

## 2023-04-21 LAB — BASIC METABOLIC PANEL
Anion gap: 14 (ref 5–15)
BUN: 74 mg/dL — ABNORMAL HIGH (ref 8–23)
CO2: 24 mmol/L (ref 22–32)
Calcium: 8.8 mg/dL — ABNORMAL LOW (ref 8.9–10.3)
Chloride: 95 mmol/L — ABNORMAL LOW (ref 98–111)
Creatinine, Ser: 3.39 mg/dL — ABNORMAL HIGH (ref 0.61–1.24)
GFR, Estimated: 17 mL/min — ABNORMAL LOW (ref >60)
Glucose, Bld: 231 mg/dL — ABNORMAL HIGH (ref 70–99)
Potassium: 3.6 mmol/L (ref 3.5–5.1)
Sodium: 133 mmol/L — ABNORMAL LOW (ref 135–145)

## 2023-04-21 LAB — CULTURE, BLOOD (ROUTINE X 2): Culture: NO GROWTH

## 2023-04-21 LAB — GLUCOSE, CAPILLARY
Glucose-Capillary: 206 mg/dL — ABNORMAL HIGH (ref 70–99)
Glucose-Capillary: 301 mg/dL — ABNORMAL HIGH (ref 70–99)
Glucose-Capillary: 169 mg/dL — ABNORMAL HIGH (ref 70–99)
Glucose-Capillary: 141 mg/dL — ABNORMAL HIGH (ref 70–99)
Glucose-Capillary: 227 mg/dL — ABNORMAL HIGH (ref 70–99)

## 2023-04-21 LAB — MAGNESIUM: Magnesium: 2.5 mg/dL — ABNORMAL HIGH (ref 1.7–2.4)

## 2023-04-21 MED ORDER — METOPROLOL TARTRATE 25 MG PO TABS: 25.0000 mg | ORAL_TABLET | Freq: Two times a day (BID) | ORAL | Status: DC

## 2023-04-21 MED ORDER — FUROSEMIDE 40 MG PO TABS
40.0000 mg | ORAL_TABLET | Freq: Every day | ORAL | Status: DC
  Administered 2023-04-22 – 2023-04-24 (×3): 40 mg via ORAL
  Filled 2023-04-21 (×4): qty 1

## 2023-04-21 MED ORDER — POLYETHYLENE GLYCOL 3350 17 G PO PACK
17.0000 g | PACK | Freq: Two times a day (BID) | ORAL | Status: DC
  Administered 2023-04-21: 17 g via ORAL
  Filled 2023-04-21: qty 1

## 2023-04-21 MED ORDER — POLYETHYLENE GLYCOL 3350 17 G PO PACK
34.0000 g | PACK | Freq: Two times a day (BID) | ORAL | Status: DC
  Administered 2023-04-21 – 2023-04-22 (×2): 34 g via ORAL
  Filled 2023-04-21 (×5): qty 2

## 2023-04-21 MED ORDER — METOPROLOL TARTRATE 25 MG PO TABS
25.0000 mg | ORAL_TABLET | Freq: Two times a day (BID) | ORAL | Status: DC
  Administered 2023-04-21 – 2023-04-22 (×3): 25 mg via ORAL
  Filled 2023-04-21 (×4): qty 1

## 2023-04-21 NOTE — Progress Notes (Signed)
[facility] Cardiology  SUBJECTIVE: Patient sitting in chair, denies chest pain or shortness of breath   Vitals:   04/20/23 1949 04/21/23 0309 04/21/23 0748 04/21/23 0757  BP:  (!) 132/58  (!) 122/55  Pulse:  75  77  Resp:  18  14  Temp:  98 F (36.7 C)  97.7 F (36.5 C)  TempSrc:  Oral  Oral  SpO2: 95% 99% 90% 95%  Weight:         Intake/Output Summary (Last 24 hours) at 04/21/2023 0854 Last data filed at 04/21/2023 1610 Gross per 24 hour  Intake 1253.77 ml  Output 2100 ml  Net -846.23 ml      PHYSICAL EXAM  General: Well developed, well nourished, in no acute distress HEENT:  Normocephalic and atramatic Neck:  No JVD.  Lungs: Clear bilaterally to auscultation and percussion. Heart: HRRR . Normal S1 and S2 without gallops or murmurs.  Abdomen: Bowel sounds are positive, abdomen soft and non-tender  Msk:  Back normal, normal gait. Normal strength and tone for age. Extremities: No clubbing, cyanosis or edema.   Neuro: Alert and oriented X 3. Psych:  Good affect, responds appropriately   LABS: Basic Metabolic Panel: Recent Labs    04/20/23 0344 04/21/23 0404  NA 133* 133*  K 3.8 3.6  CL 95* 95*  CO2 24 24  GLUCOSE 173* 231*  BUN 73* 74*  CREATININE 3.09* 3.39*  CALCIUM 8.9 8.8*  MG 2.6* 2.5*   Liver Function Tests: Recent Labs    04/19/23 0134  AST 39  ALT 18  ALKPHOS 51  BILITOT 1.0  PROT 6.8  ALBUMIN 3.8   No results for input(s): "LIPASE", "AMYLASE" in the last 72 hours. CBC: Recent Labs    04/20/23 0344 04/21/23 0404  WBC 10.9* 10.7*  HGB 10.7* 11.0*  HCT 32.7* 32.9*  MCV 96.7 96.5  PLT 266 300   Cardiac Enzymes: No results for input(s): "CKTOTAL", "CKMB", "CKMBINDEX", "TROPONINI" in the last 72 hours. BNP: Invalid input(s): "POCBNP" D-Dimer: No results for input(s): "DDIMER" in the last 72 hours. Hemoglobin A1C: Recent Labs    04/19/23 0134  HGBA1C 6.2*   Fasting Lipid Panel: No results for input(s): "CHOL", "HDL", "LDLCALC",  "TRIG", "CHOLHDL", "LDLDIRECT" in the last 72 hours. Thyroid Function Tests: No results for input(s): "TSH", "T4TOTAL", "T3FREE", "THYROIDAB" in the last 72 hours.  Invalid input(s): "FREET3" Anemia Panel: No results for input(s): "VITAMINB12", "FOLATE", "FERRITIN", "TIBC", "IRON", "RETICCTPCT" in the last 72 hours.  No results found.   Echo   Normal left ventricular function, EF 55-60%, moderate aortic stenosis (AVA 1.27 cm, peak velocity 3.14 m/s, peak gradient 39.4 mmHg, mean gradient 22.0 mmHg)  04/18/2023   TELEMETRY: Sinus rhythm 87 bpm:  ASSESSMENT AND PLAN:  Principal Problem:   NSTEMI (non-ST elevated myocardial infarction) (HCC) Active Problems:   TIA (transient ischemic attack)   Essential hypertension   Hyperlipidemia   Anemia of chronic renal failure, stage 4 (severe) (HCC)   Chronic diastolic heart failure (HCC)   Chronic obstructive pulmonary disease (HCC)   Gout   Type II diabetes mellitus with renal manifestations (HCC)   Acute renal failure superimposed on stage 4 chronic kidney disease (HCC)   Leukocytosis    1. NSTEMI (90, 454, 801, 782) with known underlying CAD, with cardiac cath/03/2023 revealing patent stents proximal left circumflex and RCA, on heparin for 48 hours, with normal LV function on 2D echocardiogram 2.  Acute on chronic HFpEF, EF 55-60%, on furosemide 40 mg  IV twice daily, with continued good diuresis, net output 846 cc over last 24 hours, good medical management limited by CKD stage IV 3.  Moderate aortic stenosis (AVA 1.27, peak velocity 3.14 m/s, peak gradient 39.4 mmHg, mean gradient 22.0 mmHg) 4.  AKI on CKD stage IV, BUN and creatinine 74 and 3.39, respectively with GFR 17, worse compared to yesterday   Recommendations   1.  Agree with current therapy 2.  Transition furosemide IV to p.o. 3.  Continue to monitor renal status 4.  Continue metoprolol tartrate 5.  Continue isosorbide mononitrate and Ranexa 6.  Defer cardiac  catheterization at this time, especially in light of underlying chronic kidney disease, and patient undergone recent cardiac catheterization 12/13/2022 which revealed patent coronary stents 7.  Appears stable for discharge from cardiovascular perspective 8.  Follow-up with Dr. Juliann Pares 1 to 2 weeks   Marcina Millard, MD, PhD, [facility] 04/21/2023 8:54 AM

## 2023-04-21 NOTE — Progress Notes (Signed)
[facility]  PROGRESS NOTE   Subjective:   Out of bed to chair.  Patient ambulated today.  Objective:  Vital signs: Blood pressure (!) 112/57, pulse 90, temperature 97.7 F (36.5 C), temperature source Oral, resp. rate 16, weight 95.1 kg, SpO2 100 %.  Intake/Output Summary (Last 24 hours) at 04/21/2023 1331 Last data filed at 04/21/2023 1043 Gross per 24 hour  Intake 870 ml  Output 1200 ml  Net -330 ml   Filed Weights   04/18/23 0441  Weight: 95.1 kg     Physical Exam: General:  No acute distress  Head:  Normocephalic, atraumatic. Moist oral mucosal membranes  Eyes:  Anicteric  Neck:  Supple  Lungs:   Clear to auscultation, normal effort  Heart:  S1S2 no rubs  Abdomen:   Soft, nontender, bowel sounds present  Extremities:  peripheral edema.  Neurologic:  Awake, alert, following commands  Skin:  No lesions  Access:     Basic Metabolic Panel: Recent Labs  Lab 04/18/23 0449 04/19/23 0134 04/20/23 0344 04/21/23 0404  NA 130* 133* 133* 133*  K 4.2 4.1 3.8 3.6  CL 95* 94* 95* 95*  CO2 21* 25 24 24   GLUCOSE 263* 196* 173* 231*  BUN 78* 80* 73* 74*  CREATININE 3.74* 3.64* 3.09* 3.39*  CALCIUM 8.3* 8.5* 8.9 8.8*  MG 2.2  --  2.6* 2.5*   GFR: Estimated Creatinine Clearance: 18.1 mL/min (A) (by C-G formula based on SCr of 3.39 mg/dL (H)).  Liver Function Tests: Recent Labs  Lab 04/18/23 0449 04/19/23 0134  AST 19 39  ALT 13 18  ALKPHOS 49 51  BILITOT 1.0 1.0  PROT 6.2* 6.8  ALBUMIN 3.5 3.8   No results for input(s): "LIPASE", "AMYLASE" in the last 168 hours. No results for input(s): "AMMONIA" in the last 168 hours.  CBC: Recent Labs  Lab 04/18/23 0449 04/19/23 0134 04/20/23 0344 04/21/23 0404  WBC 18.2* 13.4* 10.9* 10.7*  NEUTROABS 15.5*  --   --   --   HGB 10.2* 10.4* 10.7* 11.0*  HCT 31.9* 31.9* 32.7* 32.9*  MCV 98.8 98.5 96.7 96.5  PLT 229 237 266 300     HbA1C: Hemoglobin A1C  Date/Time Value Ref Range Status   07/18/2021 12:00 AM 8.3  Final   Hgb A1c MFr Bld  Date/Time Value Ref Range Status  04/19/2023 01:34 AM 6.2 (H) 4.8 - 5.6 % Final    Comment:    (NOTE) Pre diabetes:          5.7%-6.4%  Diabetes:              >6.4%  Glycemic control for   <7.0% adults with diabetes   08/15/2022 09:56 AM 7.5 (H) 4.8 - 5.6 % Final    Comment:    (NOTE) Pre diabetes:          5.7%-6.4%  Diabetes:              >6.4%  Glycemic control for   <7.0% adults with diabetes     Urinalysis: No results for input(s): "COLORURINE", "LABSPEC", "PHURINE", "GLUCOSEU", "HGBUR", "BILIRUBINUR", "KETONESUR", "PROTEINUR", "UROBILINOGEN", "NITRITE", "LEUKOCYTESUR" in the last 72 hours.  Invalid input(s): "APPERANCEUR"    Imaging: No results found.   Medications:     allopurinol  100 mg Oral Daily   arformoterol  15 mcg Nebulization BID   And   umeclidinium bromide  1 puff Inhalation Daily   aspirin EC  81 mg Oral Daily   atorvastatin  80 mg Oral Daily   citalopram  10 mg Oral Daily   clopidogrel  75 mg Oral Daily   docusate sodium  100 mg Oral BID   furosemide  40 mg Oral Daily   heparin injection (subcutaneous)  5,000 Units Subcutaneous Q8H   insulin aspart  0-5 Units Subcutaneous QHS   insulin aspart  0-9 Units Subcutaneous TID WC   insulin aspart  7 Units Subcutaneous TID WC   insulin glargine-yfgn  27 Units Subcutaneous QHS   isosorbide mononitrate  60 mg Oral Daily   methocarbamol  500 mg Oral TID   metoprolol tartrate  25 mg Oral BID   polyethylene glycol  17 g Oral BID   ranolazine  500 mg Oral Daily   sodium bicarbonate  650 mg Oral BID    Assessment/ Plan:     82 y.o.  male with past medical conditions including type 2 diabetes, CVA, chronic diastolic heart failure, CAD, COPD, and chronic kidney disease stage IV.  Patient presents to the emergency department complaining of chest pain that radiates to his jaw and left arm.  Patient has been admitted for Other chest pain  [R07.89] NSTEMI (non-ST elevated myocardial infarction) (HCC) [I21.4]   #1: Acute kidney injury on chronic kidney disease: Renal indices are slowly but steadily improving.  Continue present treatment.   #2: Anemia of chronic kidney disease: Presently stable.   #3: Diabetes: Will continue the insulin as ordered.   #4: Metabolic acidosis: Will continue the sodium bicarbonate at 650 mg twice a day.   #5: Hypertension/coronary artery disease/congestive heart failure.: Continue the metoprolol and Imdur as ordered by cardiology.  Also continue the furosemide at 40 mg a day.  I advised him to stay on fluid restriction.   Will monitor closely.    LOS: 3 Lorain Childes, MD [facility] 5/12/20241:31 PM

## 2023-04-21 NOTE — Progress Notes (Signed)
PROGRESS NOTE    Mike Decker  ZOX:[id number] DOB: [birth year] DOA: 04/18/2023 PCP: [name], MD  [id number]  LOS: 3 days   Brief hospital course:   Assessment & Plan: Mike Decker is a 82 y.o. male with medical history significant of multiple medical issues including coronary artery disease status post stenting, chronic diastolic heart failure, hypertension, history of CVA, type 2 diabetes, stage IV CKD, gout, COPD presenting with NSTEMI and acute on chronic stage IV CKD.  Patient reports being woke up around 3 AM with severe 10 out of 10 chest pain.    * NSTEMI (non-ST elevated myocardial infarction) (HCC) history of CAD with unstable angina S/p PCI/stent to proximal RCA and proximal circumflex Noted cardiac cath 12/2022 showing multivessel disease  --trop 34 to 801.  S/p 48 hours of heparin gtt Plan: --cont ASA and statin --cont isosorbide and Ranexa (reduced due to reduced renal function) --cardio rec medical management. --cardio hesitant to proceed with AKI w/ hx CKD 4  --Follow-up with Dr. Juliann Pares 1 to 2 weeks   # Acute on chronic HFpEF # Moderate aortic stenosis Clinically hypervolemic with an oxygen requirement, crackles on exam, and a distended abdomen with a ~9 pound weight gain since the weekend in the setting of decreased dose of his regular diuretic with known renal insufficiency as below.  Chest x-ray with some pulmonary edema, BNP only elevated in the 200s. Plan: --transition to oral lasix 40 mg daily tomorrow -GDMT with metoprolol -Previously on Jardiance prior to admission, hold this for now with AKI.  Addition of MRA, ACE/Arni limited by renal insufficiency  Afib w RVR --went into RVR this morning.  Home Lopressor was reduced on presentation due to hypotension. --increase Lopressor to 25 mg BID  Leukocytosis White count 18.2 on presentation, trended down without abx.  Likely stress reaction.  Acute renal failure superimposed on stage 4  chronic kidney disease (HCC) Baseline creatinine 2.3-2.9 Creatinine 3.74 on presentation. --nephro consulted --monitor while on diuretic  Metabolic acidosis --cont Na bicarb  Essential hypertension --cont Lopressor, Imdur  Type II diabetes mellitus with renal manifestations (HCC) hyperglycemia --recent A1c 7.0, well controlled --cont glargine 27u nightly --cont mealtime 7u TID --SSI  Hyperlipidemia Cont statin    Anemia of chronic renal failure  Gout Cont allopurinol    Chronic obstructive pulmonary disease (HCC) Stable from a resp standpoint  Cont home inhalers    Hx of TIA (transient ischemic attack) --cont ASA, plavix and statin   DVT prophylaxis: Heparin SQ Code Status: Full code  Family Communication:  Level of care: Telemetry Cardiac Dispo:   The patient is from: home Anticipated d/c is to: home Anticipated d/c date is: likely tomorrow   Subjective and Interval History:  Pt suddenly went into Afib w RVR this morning.  Reported no BM since presentation.    Objective: Vitals:   04/21/23 0748 04/21/23 0757 04/21/23 1242 04/21/23 1628  BP:  (!) 122/55 (!) 112/57 129/63  Pulse:  77 90 71  Resp:  14 16 18   Temp:  97.7 F (36.5 C) 97.7 F (36.5 C) 98 F (36.7 C)  TempSrc:  Oral Oral Oral  SpO2: 90% 95% 100% 100%  Weight:        Intake/Output Summary (Last 24 hours) at 04/21/2023 1753 Last data filed at 04/21/2023 1426 Gross per 24 hour  Intake 870 ml  Output 850 ml  Net 20 ml   Filed Weights   04/18/23 0441  Weight: 95.1 kg  Examination:   Constitutional: NAD, AAOx3 HEENT: conjunctivae and lids normal, EOMI CV: No cyanosis.   RESP: normal respiratory effort, on RA Neuro: II - XII grossly intact.   Psych: Normal mood and affect.  Appropriate judgement and reason   Data Reviewed: I have personally reviewed labs and imaging studies  Time spent: 50 minutes  Darlin Priestly, MD [facility] If 7PM-7AM, please contact  night-coverage 04/21/2023, 5:53 PM

## 2023-04-21 NOTE — Progress Notes (Signed)
Mobility Specialist - Progress Note   04/21/23 1319  Mobility  Activity Ambulated independently in hallway  Level of Assistance Independent  Assistive Device Front wheel walker  Distance Ambulated (ft) 160 ft  Activity Response Tolerated well  Mobility Referral Yes  $Mobility charge 1 Mobility  Mobility Specialist Start Time (ACUTE ONLY) 0107  Mobility Specialist Stop Time (ACUTE ONLY) 0117  Mobility Specialist Time Calculation (min) (ACUTE ONLY) 10 min   Pt in hallway on RA upon arrival. Pt ambulates 1 lap around NS indep. Pt returns to recliner with needs in reach.   Terrilyn Saver  Mobility Specialist  04/21/23 1:22 PM

## 2023-04-22 ENCOUNTER — Inpatient Hospital Stay: Payer: Medicare Other

## 2023-04-22 DIAGNOSIS — I214 Non-ST elevation (NSTEMI) myocardial infarction: Secondary | ICD-10-CM | POA: Diagnosis not present

## 2023-04-22 DIAGNOSIS — I480 Paroxysmal atrial fibrillation: Secondary | ICD-10-CM | POA: Diagnosis not present

## 2023-04-22 LAB — BASIC METABOLIC PANEL
Anion gap: 12 (ref 5–15)
BUN: 69 mg/dL — ABNORMAL HIGH (ref 8–23)
CO2: 25 mmol/L (ref 22–32)
Calcium: 8.7 mg/dL — ABNORMAL LOW (ref 8.9–10.3)
Chloride: 94 mmol/L — ABNORMAL LOW (ref 98–111)
Creatinine, Ser: 3.02 mg/dL — ABNORMAL HIGH (ref 0.61–1.24)
GFR, Estimated: 20 mL/min — ABNORMAL LOW (ref >60)
Glucose, Bld: 160 mg/dL — ABNORMAL HIGH (ref 70–99)
Potassium: 3.7 mmol/L (ref 3.5–5.1)
Sodium: 131 mmol/L — ABNORMAL LOW (ref 135–145)

## 2023-04-22 LAB — CULTURE, BLOOD (ROUTINE X 2): Culture: NO GROWTH

## 2023-04-22 LAB — CBC
HCT: 34.1 % — ABNORMAL LOW (ref 39.0–52.0)
Hemoglobin: 11.3 g/dL — ABNORMAL LOW (ref 13.0–17.0)
MCH: 31.7 pg (ref 26.0–34.0)
MCHC: 33.1 g/dL (ref 30.0–36.0)
MCV: 95.8 fL (ref 80.0–100.0)
Platelets: 296 10*3/uL (ref 150–400)
RBC: 3.56 MIL/uL — ABNORMAL LOW (ref 4.22–5.81)
RDW: 13.8 % (ref 11.5–15.5)
WBC: 14 10*3/uL — ABNORMAL HIGH (ref 4.0–10.5)
nRBC: 0 % (ref 0.0–0.2)

## 2023-04-22 LAB — GLUCOSE, CAPILLARY
Glucose-Capillary: 177 mg/dL — ABNORMAL HIGH (ref 70–99)
Glucose-Capillary: 290 mg/dL — ABNORMAL HIGH (ref 70–99)
Glucose-Capillary: 173 mg/dL — ABNORMAL HIGH (ref 70–99)
Glucose-Capillary: 246 mg/dL — ABNORMAL HIGH (ref 70–99)

## 2023-04-22 LAB — MAGNESIUM: Magnesium: 2.5 mg/dL — ABNORMAL HIGH (ref 1.7–2.4)

## 2023-04-22 MED ORDER — INSULIN ASPART 100 UNIT/ML IJ SOLN
9.0000 [IU] | Freq: Three times a day (TID) | INTRAMUSCULAR | Status: DC
  Administered 2023-04-23 (×3): 9 [IU] via SUBCUTANEOUS
  Filled 2023-04-22 (×3): qty 1

## 2023-04-22 MED ORDER — POTASSIUM CHLORIDE CRYS ER 20 MEQ PO TBCR
40.0000 meq | EXTENDED_RELEASE_TABLET | Freq: Once | ORAL | Status: AC
  Administered 2023-04-22: 40 meq via ORAL
  Filled 2023-04-22: qty 2

## 2023-04-22 MED ORDER — DILTIAZEM HCL ER COATED BEADS 120 MG PO CP24
120.0000 mg | ORAL_CAPSULE | Freq: Every day | ORAL | Status: DC
  Administered 2023-04-22 – 2023-04-24 (×3): 120 mg via ORAL
  Filled 2023-04-22 (×3): qty 1

## 2023-04-22 MED ORDER — SODIUM CHLORIDE 0.9 % IV SOLN
100.0000 mg | Freq: Two times a day (BID) | INTRAVENOUS | Status: DC
  Administered 2023-04-22 – 2023-04-24 (×4): 100 mg via INTRAVENOUS
  Filled 2023-04-22 (×5): qty 100

## 2023-04-22 NOTE — Progress Notes (Signed)
[facility]  PROGRESS NOTE   Subjective:   Complains of hemaptysis. Started on doxycycline by pulmonary. Holding ASA and heparin.   Objective:  Vital signs: Blood pressure (!) 119/56, pulse 67, temperature 98.4 F (36.9 C), temperature source Oral, resp. rate 14, weight 95.1 kg, SpO2 100 %.  Intake/Output Summary (Last 24 hours) at 04/22/2023 1735 Last data filed at 04/22/2023 0924 Gross per 24 hour  Intake 480 ml  Output 650 ml  Net -170 ml    Filed Weights   04/18/23 0441  Weight: 95.1 kg     Physical Exam: General:  No acute distress  Head:  Normocephalic, atraumatic. Moist oral mucosal membranes  Eyes:  Anicteric  Neck:  Supple  Lungs:   Clear to auscultation, normal effort  Heart:  regular  Abdomen:   Soft, nontender, bowel sounds present  Extremities:  No peripheral edema.  Neurologic:  Awake, alert, following commands  Skin:  No lesions   :     Basic Metabolic Panel: Recent Labs  Lab 04/18/23 0449 04/19/23 0134 04/20/23 0344 04/21/23 0404 04/22/23 0434  NA 130* 133* 133* 133* 131*  K 4.2 4.1 3.8 3.6 3.7  CL 95* 94* 95* 95* 94*  CO2 21* 25 24 24 25   GLUCOSE 263* 196* 173* 231* 160*  BUN 78* 80* 73* 74* 69*  CREATININE 3.74* 3.64* 3.09* 3.39* 3.02*  CALCIUM 8.3* 8.5* 8.9 8.8* 8.7*  MG 2.2  --  2.6* 2.5* 2.5*    GFR: Estimated Creatinine Clearance: 20.4 mL/min (A) (by C-G formula based on SCr of 3.02 mg/dL (H)).  Liver Function Tests: Recent Labs  Lab 04/18/23 0449 04/19/23 0134  AST 19 39  ALT 13 18  ALKPHOS 49 51  BILITOT 1.0 1.0  PROT 6.2* 6.8  ALBUMIN 3.5 3.8    No results for input(s): "LIPASE", "AMYLASE" in the last 168 hours. No results for input(s): "AMMONIA" in the last 168 hours.  CBC: Recent Labs  Lab 04/18/23 0449 04/19/23 0134 04/20/23 0344 04/21/23 0404 04/22/23 0434  WBC 18.2* 13.4* 10.9* 10.7* 14.0*  NEUTROABS 15.5*  --   --   --   --   HGB 10.2* 10.4* 10.7* 11.0* 11.3*  HCT 31.9* 31.9* 32.7*  32.9* 34.1*  MCV 98.8 98.5 96.7 96.5 95.8  PLT 229 237 266 300 296      HbA1C: Hemoglobin A1C  Date/Time Value Ref Range Status  07/18/2021 12:00 AM 8.3  Final   Hgb A1c MFr Bld  Date/Time Value Ref Range Status  04/19/2023 01:34 AM 6.2 (H) 4.8 - 5.6 % Final    Comment:    (NOTE) Pre diabetes:          5.7%-6.4%  Diabetes:              >6.4%  Glycemic control for   <7.0% adults with diabetes   08/15/2022 09:56 AM 7.5 (H) 4.8 - 5.6 % Final    Comment:    (NOTE) Pre diabetes:          5.7%-6.4%  Diabetes:              >6.4%  Glycemic control for   <7.0% adults with diabetes     Urinalysis: No results for input(s): "COLORURINE", "LABSPEC", "PHURINE", "GLUCOSEU", "HGBUR", "BILIRUBINUR", "KETONESUR", "PROTEINUR", "UROBILINOGEN", "NITRITE", "LEUKOCYTESUR" in the last 72 hours.  Invalid input(s): "APPERANCEUR"    Imaging: CT CHEST WO CONTRAST  Result Date: 04/22/2023 CLINICAL DATA:  Hemoptysis EXAM: CT CHEST WITHOUT CONTRAST TECHNIQUE: Multidetector  CT imaging of the chest was performed following the standard protocol without IV contrast. RADIATION DOSE REDUCTION: This exam was performed according to the departmental dose-optimization program which includes automated exposure control, adjustment of the mA and/or kV according to patient size and/or use of iterative reconstruction technique. COMPARISON:  Chest x-ray 04/18/2023 and older. Noncontrast high-resolution chest CT 10/20/2020. FINDINGS: Cardiovascular: Calcifications along the aortic valve. The thoracic aorta has a normal course and caliber with mild calcified plaque. There is a bovine type aortic arch, a normal variant. Coronary artery calcifications are seen. The heart itself is nonenlarged. Trace pericardial fluid or thickening. Mediastinum/Nodes: Preserved thyroid gland. The thoracic aorta has a overall normal course and caliber with a small hiatal hernia. No specific abnormal lymph node enlargement identified in the  axillary regions, hilum or mediastinum. Lungs/Pleura: Once again there is some underlying emphysematous changes with peripheral areas of interstitial septal thickening and calcified lung nodules. No consolidation, pneumothorax or effusion. No consolidation. Clear central airways. Upper Abdomen: Adrenal glands are preserved in the upper abdomen. Small splenules. Again 3.4 cm dome liver lesion. This previously has been evaluated as a hemangioma. Musculoskeletal: Mild degenerative changes seen along the spine. IMPRESSION: Stable appearance of the lungs with peripheral areas of interstitial septal thickening, bronchiectasis and underlying emphysematous changes. Evidence of old granulomatous disease. Small hiatal hernia. Aortic Atherosclerosis (ICD10-I70.0) and Emphysema (ICD10-J43.9). Electronically Signed   By: Karen Kays M.D.   On: 04/22/2023 15:37     Medications:    doxycycline (VIBRAMYCIN) IV      allopurinol  100 mg Oral Daily   arformoterol  15 mcg Nebulization BID   And   umeclidinium bromide  1 puff Inhalation Daily   atorvastatin  80 mg Oral Daily   citalopram  10 mg Oral Daily   diltiazem  120 mg Oral Daily   docusate sodium  100 mg Oral BID   furosemide  40 mg Oral Daily   heparin injection (subcutaneous)  5,000 Units Subcutaneous Q8H   insulin aspart  0-5 Units Subcutaneous QHS   insulin aspart  0-9 Units Subcutaneous TID WC   insulin aspart  7 Units Subcutaneous TID WC   insulin glargine-yfgn  27 Units Subcutaneous QHS   isosorbide mononitrate  60 mg Oral Daily   methocarbamol  500 mg Oral TID   metoprolol tartrate  25 mg Oral BID   polyethylene glycol  34 g Oral BID   ranolazine  500 mg Oral Daily   sodium bicarbonate  650 mg Oral BID    Assessment/ Plan:     82 y.o.  male with past medical conditions including type 2 diabetes, CVA, chronic diastolic heart failure, CAD, COPD, and chronic kidney disease stage IV.  Patient presents to the emergency department complaining of  chest pain that radiates to his jaw and left arm.  Patient has been admitted for Other chest pain [R07.89] NSTEMI (non-ST elevated myocardial infarction) (HCC) [I21.4]   #1: Acute kidney injury on chronic kidney disease stage IV: Renal indices are slowly but steadily improving.  No indication for dialysis   #2: Anemia of chronic kidney disease: Presently hemoglobin stable.    #3: Diabetes mellitus type II with chronic kidney disease: continue glucose control.    #4: Metabolic acidosis: chronic. Will continue the sodium bicarbonate at 650 mg twice a day.   #5: Hypertension/coronary artery disease/congestive heart failure.: - fluid restriction - isosorbide mononitrate, metoprolol, furosemide and started this hospitalization: diltiazem.    LOS: 4 Mike Dowdy, MD  [facility] 5/13/20245:35 PM

## 2023-04-22 NOTE — Progress Notes (Signed)
[facility] Cardiology  Patient ID: Mike Decker MRN: [id number] DOB/AGE: [birth year] 82 y.o.   Admit date: 04/18/2023 Referring Physician Dr. Andrena Mews Primary Physician Dr. Harl Bowie Primary Cardiologist Dr. 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  Cardiology is consulted for further assistance.   Interval History:  -Continues to feel better today -No chest pain or dyspnea, heart racing or palpitations -Main concern is dark-tinged sputum throughout the morning he has collected on multiple handfuls of tissues -Predominantly NSR on telemetry, brief 1 hour period of atrial fibrillation   Vitals:   04/21/23 2045 04/21/23 2358 04/22/23 0527 04/22/23 0749  BP:  (!) 115/42 118/68 (!) 147/53  Pulse:  68 72 69  Resp:  18 18 14   Temp:  97.9 F (36.6 C) 98.2 F (36.8 C) 98 F (36.7 C)  TempSrc:  Oral Oral Oral  SpO2: 99% 99% 97% 100%  Weight:         Intake/Output Summary (Last 24 hours) at 04/22/2023 1146 Last data filed at 04/22/2023 4098 Gross per 24 hour  Intake 1050 ml  Output 800 ml  Net 250 ml       PHYSICAL EXAM General: Elderly and chronically ill appearing Caucasian male, sitting upright in recliner in PCU HEENT:  Normocephalic and atraumatic. Neck:   No JVD.  Lungs: Normal respiratory effort on room air.  Trace crackles and decreased breath sounds in upper lung fields without appreciable wheezes.   Heart: HRRR . Normal S1 and S2 without gallops or murmurs.  Abdomen: Mildly distended appearing with excess adiposity. Msk: Normal strength and tone for age. Extremities: Warm and  well perfused. No clubbing, cyanosis. No peripheral edema.  Neuro: Alert and oriented X 3. Psych:  Answers questions appropriately.    LABS: Basic Metabolic Panel: Recent Labs    04/21/23 0404 04/22/23 0434  NA 133* 131*  K 3.6 3.7  CL 95* 94*  CO2 24 25  GLUCOSE 231* 160*  BUN 74* 69*  CREATININE 3.39* 3.02*  CALCIUM 8.8* 8.7*  MG 2.5* 2.5*    Liver Function Tests: No results for input(s): "AST", "ALT", "ALKPHOS", "BILITOT", "PROT", "ALBUMIN" in the last 72 hours.  No results for input(s): "LIPASE", "AMYLASE" in the last 72 hours. CBC: Recent Labs    04/21/23 0404 04/22/23 0434  WBC 10.7* 14.0*  HGB 11.0* 11.3*  HCT 32.9* 34.1*  MCV 96.5 95.8  PLT 300 296    Cardiac Enzymes: No results for input(s): "CKTOTAL", "CKMB", "CKMBINDEX", "TROPONINI" in the last 72 hours. BNP: Invalid input(s): "POCBNP" D-Dimer: No results for input(s): "DDIMER" in the last 72 hours. Hemoglobin A1C: No results for input(s): "HGBA1C" in the last 72 hours.  Fasting Lipid Panel: No results for input(s): "CHOL", "HDL", "LDLCALC", "TRIG", "CHOLHDL", "LDLDIRECT" in the last 72 hours. Thyroid Function Tests: No results for input(s): "TSH", "T4TOTAL", "T3FREE", "THYROIDAB" in the last 72 hours.  Invalid input(s): "FREET3" Anemia Panel: No results for input(s): "VITAMINB12", "FOLATE", "FERRITIN", "TIBC", "IRON", "RETICCTPCT" in the last 72 hours.  No results found.   Echo   Normal left ventricular function, EF 55-60%,  moderate aortic stenosis (AVA 1.27 cm, peak velocity 3.14 m/s, peak gradient 39.4 mmHg, mean gradient 22.0 mmHg)  04/18/2023   TELEMETRY: Sinus rhythm 87 bpm:, 1 hour period of atrial fibrillation on telemetry  ASSESSMENT AND PLAN:  Principal Problem:   NSTEMI (non-ST elevated myocardial infarction) (HCC) Active Problems:   TIA (transient ischemic attack)   Essential hypertension   Hyperlipidemia   Anemia of chronic renal failure, stage 4 (severe) (HCC)   Chronic  diastolic heart failure (HCC)   Chronic obstructive pulmonary disease (HCC)   Gout   Type II diabetes mellitus with renal manifestations (HCC)   Acute renal failure superimposed on stage 4 chronic kidney disease (HCC)   Leukocytosis    1. NSTEMI (90, 454, 801, 782) with known underlying CAD, with cardiac cath 12/13/2022 revealing patent stents proximal left circumflex and RCA, s/p heparin for 48 hours, with normal LV function on 2D echocardiogram.  2.  Acute on chronic HFpEF, EF 55-60%, on furosemide 40 mg IV twice daily, with continued good diuresis, net output 846 cc over last 24 hours, good medical management limited by CKD stage IV 3.  Moderate aortic stenosis (AVA 1.27, peak velocity 3.14 m/s, peak gradient 39.4 mmHg, mean gradient 22.0 mmHg) 4.  AKI on CKD stage IV, BUN and creatinine 69/3.02 and GFR 20 5.  Paroxysmal atrial fibrillation, relatively asymptomatic, predominant underlying NSR 6.  Hemoptysis, with stable H/H 11.3/4.1, CT chest ordered by primary team with pulmonology consult pending   Recommendations   1.  Agree with current therapy 2.  Okay to hold DAPT for now with workup for hemoptysis ongoing, resume as able 3.  7-day Holter placed today to assess atrial fibrillation burden before starting chronic anticoagulation, especially in the setting of hemoptysis 3.  Continue furosemide IV p.o. 40 mg daily 4.  Continue to monitor renal status 5.  Continue metoprolol tartrate 6.  Add Cardizem CD 120 mg daily 7.  Continue isosorbide mononitrate and Ranexa 8.  Continue to defer cardiac catheterization at this time, especially in light of underlying chronic kidney disease, and patient undergone recent cardiac catheterization 12/13/2022 which revealed patent coronary stents 9.  Appears stable for discharge from cardiovascular perspective 10.  Follow-up with Dr. Juliann Pares 1 to 2 weeks  This patient's plan of care was discussed and created with Dr. Darrold Junker and he is in agreement.     Rebeca Allegra, PA-C 04/22/2023 11:46 AM

## 2023-04-22 NOTE — Progress Notes (Signed)
PROGRESS NOTE    Mike Decker  WUJ:[id number] DOB: 02-02-1941 DOA: 04/18/2023 PCP: [name], MD  [id number]  LOS: 4 days   Brief hospital course:   Assessment & Plan: Mike Decker is a 82 y.o. male with medical history significant of multiple medical issues including coronary artery disease status post stenting, chronic diastolic heart failure, hypertension, history of CVA, type 2 diabetes, stage IV CKD, gout, COPD presenting with NSTEMI and acute on chronic stage IV CKD.  Patient reports being woke up around 3 AM with severe 10 out of 10 chest pain.    * NSTEMI (non-ST elevated myocardial infarction) (HCC) history of CAD with unstable angina S/p PCI/stent to proximal RCA and proximal circumflex Noted cardiac cath 12/2022 showing multivessel disease  --trop 34 to 801.  S/p 48 hours of heparin gtt Plan: --hold ASA and plavix due to hemoptysis (ASA and plavix for cardiac indications) --cont statin --cont isosorbide and Ranexa (reduced due to reduced renal function) --cardio rec medical management. --cardio hesitant to proceed with AKI w/ hx CKD 4  --Follow-up with Dr. Juliann Pares 1 to 2 weeks   # Acute on chronic HFpEF # Moderate aortic stenosis Clinically hypervolemic with an oxygen requirement, crackles on exam, and a distended abdomen with a ~9 pound weight gain since the weekend in the setting of decreased dose of his regular diuretic with known renal insufficiency as below.  Chest x-ray with some pulmonary edema, BNP only elevated in the 200s. --received IV lasix 40 BID Plan: --transition to oral lasix 40 mg daily today -GDMT with metoprolol -Previously on Jardiance prior to admission, hold this for now with AKI.  Addition of MRA, ACE/Arni limited by renal insufficiency  Afib w RVR --went into RVR morning of 5/12.  Home Lopressor was reduced on presentation due to hypotension. --cont Lopressor 25 mg BID --Add Cardizem CD 120 mg daily  --7-day Holter placed  today to assess atrial fibrillation burden before starting chronic anticoagulation, especially in the setting of hemoptysis   Mild hemoptysis  --likely from bronchiectasis, aggravated by the iv heparin while taking home ASA and plavix --CT chest no acute finding, no signs of TB. --pulm consult with Dr. Meredeth Ide today --start empiric doxy --hold home ASA and plavix --consider outpatient bronch with pt's outpatient pulm Dr. Welton Flakes  Leukocytosis White count 18.2 on presentation, trended down without abx.  Likely stress reaction.  Acute renal failure superimposed on stage 4 chronic kidney disease (HCC) Baseline creatinine 2.3-2.9 Creatinine 3.74 on presentation. --nephro consulted --monitor while on diuretic  Metabolic acidosis --cont Na bicarb  Essential hypertension --cont Lopressor, Imdur --Add Cardizem CD 120 mg daily   Type II diabetes mellitus with renal manifestations (HCC) hyperglycemia --recent A1c 7.0, well controlled --cont glargine 27u nightly --increase mealtime to 9u TID --SSI  Hyperlipidemia Cont statin    Anemia of chronic renal failure  Gout Cont allopurinol    Chronic obstructive pulmonary disease (HCC) Stable from a resp standpoint  Cont home inhalers    Hx of TIA (transient ischemic attack) --cont statin --hold ASA and plavix due to hemoptysis    DVT prophylaxis: Heparin SQ Code Status: Full code  Family Communication:  Level of care: Telemetry Cardiac Dispo:   The patient is from: home Anticipated d/c is to: home Anticipated d/c date is: likely tomorrow   Subjective and Interval History:  Pt had bloody sputum today.  Pulm consult today.   Objective: Vitals:   04/21/23 2358 04/22/23 9562 04/22/23 0749 04/22/23 1612  BP: (!) 115/42 118/68 (!) 147/53 (!) 119/56  Pulse: 68 72 69 67  Resp: 18 18 14 14   Temp: 97.9 F (36.6 C) 98.2 F (36.8 C) 98 F (36.7 C) 98.4 F (36.9 C)  TempSrc: Oral Oral Oral Oral  SpO2: 99% 97% 100% 100%   Weight:        Intake/Output Summary (Last 24 hours) at 04/22/2023 1934 Last data filed at 04/22/2023 6295 Gross per 24 hour  Intake 240 ml  Output 650 ml  Net -410 ml   Filed Weights   04/18/23 0441  Weight: 95.1 kg    Examination:   Constitutional: NAD, AAOx3, walking the halls HEENT: conjunctivae and lids normal, EOMI CV: No cyanosis.   RESP: normal respiratory effort, on RA Neuro: II - XII grossly intact.   Psych: Normal mood and affect.  Appropriate judgement and reason   Data Reviewed: I have personally reviewed labs and imaging studies  Time spent: 50 minutes  Darlin Priestly, MD [facility] If 7PM-7AM, please contact night-coverage 04/22/2023, 7:34 PM

## 2023-04-22 NOTE — Consult Note (Addendum)
Pulmonary Medicine          Date: 04/22/2023,   MRN# [id number] Mike Decker [birth year]         CHIEF COMPLAINT:   Hemoptysis    HISTORY OF PRESENT ILLNESS   This is an 82 yr old Male.  He came in fot chest pain. He was told he had an mi. Did receive heparin iv and statrted to have hemoptysis. So far he thick he has brought up half a cup. He is not bleeding else where. He has had gi bleedin the rmote past x 2. No black stools. No fever, pleurist or calf pain. 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 Also has afib, on cardiazem statred.    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, esphageal   Chronic diastolic CHF (congestive heart failure) (HCC)    Colon polyp    Diabetes mellitus without complication (HCC)    Diverticulosis    Gout    Heart disease    Hemangioma    liver   Hyperlipidemia    Hypertension    Myocardial infarct [facility]    Ocular hypertension    Peripheral vascular disease (HCC)    Skin cancer    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HCC)    Sleep apnea    Vitreoretinal degeneration      SURGICAL HISTORY   Past Surgical History:  Procedure Laterality Date   CATARACT EXTRACTION  2011, 2012   COLONOSCOPY N/A 12/14/2018   Procedure: COLONOSCOPY;  Surgeon: Pasty Spillers, MD;  Location: [facility] ENDOSCOPY;  Service: Endoscopy;  Laterality: N/A;   CORONARY ANGIOPLASTY WITH STENT PLACEMENT  12/10/2010   CORONARY STENT INTERVENTION N/A 12/29/2019   Procedure: CORONARY STENT INTERVENTION;  Surgeon: Yvonne Kendall, MD;  Location: [facility] INVASIVE CV LAB;  Service: Cardiovascular;  Laterality: N/A;   ESOPHAGOGASTRODUODENOSCOPY N/A 12/14/2018   Procedure: ESOPHAGOGASTRODUODENOSCOPY (EGD);  Surgeon: Pasty Spillers, MD;  Location: [facility] ENDOSCOPY;  Service:  Endoscopy;  Laterality: N/A;   ESOPHAGOGASTRODUODENOSCOPY (EGD) WITH PROPOFOL N/A 06/01/2016   Procedure: ESOPHAGOGASTRODUODENOSCOPY (EGD) WITH PROPOFOL;  Surgeon: Scot Jun, MD;  Location: [facility] ENDOSCOPY;  Service: Endoscopy;  Laterality: N/A;   ESOPHAGOGASTRODUODENOSCOPY (EGD) WITH PROPOFOL N/A 01/31/2023   Procedure: ESOPHAGOGASTRODUODENOSCOPY (EGD) WITH PROPOFOL;  Surgeon: Midge Minium, MD;  Location: [facility] ENDOSCOPY;  Service: Endoscopy;  Laterality: N/A;   FEMORAL ARTERY STENT Right    HERNIA REPAIR     x2   INTRAOCULAR LENS INSERTION     LEFT HEART CATH AND CORONARY ANGIOGRAPHY N/A 05/09/2017   Procedure: Left Heart Cath and Coronary Angiography;  Surgeon: Alwyn Pea, MD;  Location: [facility] INVASIVE CV LAB;  Service: Cardiovascular;  Laterality: N/A;   LEFT HEART CATH AND CORONARY ANGIOGRAPHY N/A 12/08/2018   Procedure: LEFT HEART CATH AND CORONARY ANGIOGRAPHY and possible PCI and stent;  Surgeon: Alwyn Pea, MD;  Location: [facility] INVASIVE CV LAB;  Service: Cardiovascular;  Laterality: N/A;   LEFT HEART CATH AND CORONARY ANGIOGRAPHY N/A 12/29/2019   Procedure: LEFT HEART CATH AND CORONARY ANGIOGRAPHY;  Surgeon: Yvonne Kendall, MD;  Location: [facility] INVASIVE CV LAB;  Service: Cardiovascular;  Laterality: N/A;   LEFT HEART CATH AND CORONARY ANGIOGRAPHY Left 12/13/2022   Procedure: LEFT HEART CATH AND CORONARY  ANGIOGRAPHY;  Surgeon: Alwyn Pea, MD;  Location: [facility] INVASIVE CV LAB;  Service: Cardiovascular;  Laterality: Left;   NOSE SURGERY     submucous resection   PROSTATE SURGERY  12/10/2000   ROTATOR CUFF REPAIR Right      FAMILY HISTORY   Family History  Problem Relation Age of Onset   Arthritis Mother    Stroke Maternal Grandfather    Breast cancer Neg Hx      SOCIAL HISTORY   Social History   Tobacco Use   Smoking status: Former    Years: 20    Types: Cigarettes    Quit date: 12/10/1976    Years since quitting: 46.3   Smokeless tobacco: Never   Vaping Use   Vaping Use: Never used  Substance Use Topics   Alcohol use: No   Drug use: No     MEDICATIONS    Home Medication:    Current Medication:  Current Facility-Administered Medications:    acetaminophen (TYLENOL) tablet 650 mg, 650 mg, Oral, Q6H PRN, Floydene Flock, MD   allopurinol (ZYLOPRIM) tablet 100 mg, 100 mg, Oral, Daily, Floydene Flock, MD, 100 mg at 04/22/23 0920   arformoterol (BROVANA) nebulizer solution 15 mcg, 15 mcg, Nebulization, BID, 15 mcg at 04/22/23 0709 **AND** umeclidinium bromide (INCRUSE ELLIPTA) 62.5 MCG/ACT 1 puff, 1 puff, Inhalation, Daily, Darlin Priestly, MD, 1 puff at 04/22/23 0905   atorvastatin (LIPITOR) tablet 80 mg, 80 mg, Oral, Daily, Tang, Lily Michelle, PA-C, 80 mg at 04/22/23 1610   citalopram (CELEXA) tablet 10 mg, 10 mg, Oral, Daily, Floydene Flock, MD, 10 mg at 04/22/23 0916   diltiazem (CARDIZEM CD) 24 hr capsule 120 mg, 120 mg, Oral, Daily, Tang, Lily Michelle, PA-C, 120 mg at 04/22/23 1321   docusate sodium (COLACE) capsule 100 mg, 100 mg, Oral, BID, Darlin Priestly, MD, 100 mg at 04/22/23 0915   furosemide (LASIX) tablet 40 mg, 40 mg, Oral, Daily, Paraschos, Alexander, MD, 40 mg at 04/22/23 0916   heparin injection 5,000 Units, 5,000 Units, Subcutaneous, Q8H, Darlin Priestly, MD, 5,000 Units at 04/22/23 1321   insulin aspart (novoLOG) injection 0-5 Units, 0-5 Units, Subcutaneous, QHS, Floydene Flock, MD, 2 Units at 04/21/23 2044   insulin aspart (novoLOG) injection 0-9 Units, 0-9 Units, Subcutaneous, TID WC, Floydene Flock, MD, 5 Units at 04/22/23 1316   insulin aspart (novoLOG) injection 7 Units, 7 Units, Subcutaneous, TID WC, Darlin Priestly, MD, 7 Units at 04/22/23 1316   insulin glargine-yfgn (SEMGLEE) injection 27 Units, 27 Units, Subcutaneous, QHS, Darlin Priestly, MD, 27 Units at 04/21/23 2045   isosorbide mononitrate (IMDUR) 24 hr tablet 60 mg, 60 mg, Oral, Daily, Tang, Lily Michelle, PA-C, 60 mg at 04/22/23 9604   methocarbamol (ROBAXIN) tablet  500 mg, 500 mg, Oral, TID, Darlin Priestly, MD, 500 mg at 04/22/23 0915   metoprolol tartrate (LOPRESSOR) tablet 25 mg, 25 mg, Oral, BID, Paraschos, Alexander, MD, 25 mg at 04/22/23 0916   morphine (PF) 2 MG/ML injection 1 mg, 1 mg, Intravenous, Q4H PRN, Darlin Priestly, MD, 1 mg at 04/20/23 1303   nitroGLYCERIN (NITROGLYN) 2 % ointment 1 inch, 1 inch, Topical, Q6H PRN, Darlin Priestly, MD   nitroGLYCERIN (NITROSTAT) SL tablet 0.4 mg, 0.4 mg, Sublingual, Q5 min PRN, Delton Prairie, MD, 0.4 mg at 04/19/23 0957   ondansetron (ZOFRAN) tablet 4 mg, 4 mg, Oral, Q6H PRN **OR** ondansetron (ZOFRAN) injection 4 mg, 4 mg, Intravenous, Q6H PRN, Floydene Flock, MD   polyethylene glycol (  MIRALAX / GLYCOLAX) packet 34 g, 34 g, Oral, BID, Darlin Priestly, MD, 34 g at 04/21/23 2045   ranolazine (RANEXA) 12 hr tablet 500 mg, 500 mg, Oral, Daily, Darlin Priestly, MD, 500 mg at 04/22/23 1610   sodium bicarbonate tablet 650 mg, 650 mg, Oral, BID, Floydene Flock, MD, 650 mg at 04/22/23 9604    ALLERGIES   Gabapentin, Peanut-containing drug products, Penicillins, Bee venom, Influenza vaccines, Inh [isoniazid], Jardiance [empagliflozin], Kenalog [triamcinolone acetonide], Levaquin [levofloxacin], Lisinopril, Nalfon [fenoprofen calcium], Naproxen, Peanut oil, and Nsaids     REVIEW OF SYSTEMS    Review of Systems:  Gen:  Denies  fever, sweats, chills weigh loss  HEENT: Denies blurred vision, double vision, ear pain, eye pain, hearing loss, nose bleeds, sore throat Cardiac:  No dizziness, chest pain or heaviness, chest tightness,edema Resp:   Denies cough or sputum porduction, shortness of breath,wheezing, +  hemoptysis,  Gi: Denies swallowing difficulty, stomach pain, nausea or vomiting, diarrhea, constipation, bowel incontinence Gu:  Denies bladder incontinence, burning urine Ext:   Denies Joint pain, stiffness or swelling Skin: Denies  skin rash, easy bruising or bleeding or hives Endoc:  Denies polyuria, polydipsia , polyphagia or  weight change Psych:   Denies depression, insomnia or hallucinations   Other:  All other systems negative   VS: BP (!) 119/56 (BP Location: Right Arm)   Pulse 67   Temp 98.4 F (36.9 C) (Oral)   Resp 14   Wt 95.1 kg   SpO2 100%   BMI 33.84 kg/m      PHYSICAL EXAM    GENERAL:NAD, no fevers, chills, no weakness no fatigue HEAD: Normocephalic, atraumatic.  EYES: Pupils equal, round, reactive to light. Extraocular muscles intact. No scleral icterus.  MOUTH: Moist mucosal membrane. Dentition intact. No abscess noted.  EAR, NOSE, THROAT: Clear without exudates. No external lesions.  NECK: Supple. No thyromegaly. No nodules. No JVD.  PULMONARY: distant, no wheezing CARDIOVASCULAR: S1 and S2. Regular rate and rhythm. No murmurs, rubs, or gallops. No edema. Pedal pulses 2+ bilaterally.  GASTROINTESTINAL: Soft, nontender, nondistended. No masses. Positive bowel sounds. No hepatosplenomegaly.  MUSCULOSKELETAL: No swelling, clubbing, or edema. Range of motion full in all extremities.  NEUROLOGIC: Cranial nerves II through XII are intact. No gross focal neurological deficits. Sensation intact. Reflexes intact.  SKIN: No ulceration, lesions, rashes, or cyanosis. Skin warm and dry. Turgor intact.  PSYCHIATRIC: Mood, affect within normal limits. The patient is awake, alert and oriented x 3. Insight, judgment intact.       IMAGING    CT CHEST WO CONTRAST  Result Date: 04/22/2023 CLINICAL DATA:  Hemoptysis EXAM: CT CHEST WITHOUT CONTRAST TECHNIQUE: Multidetector CT imaging of the chest was performed following the standard protocol without IV contrast. RADIATION DOSE REDUCTION: This exam was performed according to the departmental dose-optimization program which includes automated exposure control, adjustment of the mA and/or kV according to patient size and/or use of iterative reconstruction technique. COMPARISON:  Chest x-ray 04/18/2023 and older. Noncontrast high-resolution chest CT  10/20/2020. FINDINGS: Cardiovascular: Calcifications along the aortic valve. The thoracic aorta has a normal course and caliber with mild calcified plaque. There is a bovine type aortic arch, a normal variant. Coronary artery calcifications are seen. The heart itself is nonenlarged. Trace pericardial fluid or thickening. Mediastinum/Nodes: Preserved thyroid gland. The thoracic aorta has a overall normal course and caliber with a small hiatal hernia. No specific abnormal lymph node enlargement identified in the axillary regions, hilum or mediastinum. Lungs/Pleura: Once again  there is some underlying emphysematous changes with peripheral areas of interstitial septal thickening and calcified lung nodules. No consolidation, pneumothorax or effusion. No consolidation. Clear central airways. Upper Abdomen: Adrenal glands are preserved in the upper abdomen. Small splenules. Again 3.4 cm dome liver lesion. This previously has been evaluated as a hemangioma. Musculoskeletal: Mild degenerative changes seen along the spine. IMPRESSION: Stable appearance of the lungs with peripheral areas of interstitial septal thickening, bronchiectasis and underlying emphysematous changes. Evidence of old granulomatous disease. Small hiatal hernia. Aortic Atherosclerosis (ICD10-I70.0) and Emphysema (ICD10-J43.9). Electronically Signed   By: Karen Kays M.D.   On: 04/22/2023 15:37   ECHOCARDIOGRAM COMPLETE  Result Date: 04/18/2023    ECHOCARDIOGRAM REPORT   Patient Name:   MATHAN DESTIN Date of Exam: 04/18/2023 Medical Rec #:  [id number]            Height:       66.0 in Accession #:    [id number]           Weight:       209.7 lb Date of Birth:  [birth year]            BSA:          2.040 m Patient Age:    81 years             BP:           128/58 mmHg Patient Gender: M                    HR:           75 bpm. Exam Location:  [facility] Procedure: 2D Echo, Cardiac Doppler and Color Doppler Indications:     Aortic Stenosis  History:          Patient has prior history of Echocardiogram examinations, most                  recent 08/15/2022. CHF, CAD, Acute MI and Angina, PAD, TIA and                  COPD, Aortic Valve Disease, Signs/Symptoms:Chest Pain and                  Shortness of Breath; Risk Factors:Hypertension, Sleep Apnea and                  Dyslipidemia. PVD.  Sonographer:     Mikki Harbor Referring Phys:  [id number] LILY MICHELLE TANG Diagnosing Phys: Marcina Millard MD  Sonographer Comments: Technically difficult study due to poor echo windows and suboptimal subcostal window. Image acquisition challenging due to COPD. IMPRESSIONS  1. Left ventricular ejection fraction, by estimation, is 55 to 60%. The left ventricle has normal function. The left ventricle has no regional wall motion abnormalities. Left ventricular diastolic parameters are consistent with Grade I diastolic dysfunction (impaired relaxation).  2. Right ventricular systolic function is normal. The right ventricular size is normal. There is normal pulmonary artery systolic pressure.  3. The mitral valve is normal in structure. Mild to moderate mitral valve regurgitation. No evidence of mitral stenosis.  4. The aortic valve is normal in structure. Aortic valve regurgitation is not visualized. Moderate aortic valve stenosis.  5. The inferior vena cava is normal in size with greater than 50% respiratory variability, suggesting right atrial pressure of 3 mmHg. FINDINGS  Left Ventricle: Left ventricular ejection fraction, by estimation, is 55 to 60%. The left ventricle has normal function. The left ventricle  has no regional wall motion abnormalities. The left ventricular internal cavity size was normal in size. There is  no left ventricular hypertrophy. Left ventricular diastolic parameters are consistent with Grade I diastolic dysfunction (impaired relaxation). Right Ventricle: The right ventricular size is normal. No increase in right ventricular wall thickness. Right  ventricular systolic function is normal. There is normal pulmonary artery systolic pressure. The tricuspid regurgitant velocity is 2.25 m/s, and  with an assumed right atrial pressure of 8 mmHg, the estimated right ventricular systolic pressure is 28.2 mmHg. Left Atrium: Left atrial size was normal in size. Right Atrium: Right atrial size was normal in size. Pericardium: There is no evidence of pericardial effusion. Mitral Valve: The mitral valve is normal in structure. Mild to moderate mitral valve regurgitation. No evidence of mitral valve stenosis. MV peak gradient, 6.4 mmHg. The mean mitral valve gradient is 3.0 mmHg. Tricuspid Valve: The tricuspid valve is normal in structure. Tricuspid valve regurgitation is not demonstrated. No evidence of tricuspid stenosis. Aortic Valve: The aortic valve is normal in structure. Aortic valve regurgitation is not visualized. Moderate aortic stenosis is present. Aortic valve mean gradient measures 22.0 mmHg. Aortic valve peak gradient measures 39.4 mmHg. Aortic valve area, by VTI measures 1.27 cm. Pulmonic Valve: The pulmonic valve was normal in structure. Pulmonic valve regurgitation is not visualized. No evidence of pulmonic stenosis. Aorta: The aortic root is normal in size and structure. Venous: The inferior vena cava is normal in size with greater than 50% respiratory variability, suggesting right atrial pressure of 3 mmHg. IAS/Shunts: No atrial level shunt detected by color flow Doppler.  LEFT VENTRICLE PLAX 2D LVIDd:         3.80 cm   Diastology LVIDs:         2.70 cm   LV e' medial:    5.55 cm/s LV PW:         1.30 cm   LV E/e' medial:  16.6 LV IVS:        1.20 cm   LV e' lateral:   8.49 cm/s LVOT diam:     2.10 cm   LV E/e' lateral: 10.9 LV SV:         92 LV SV Index:   45 LVOT Area:     3.46 cm  RIGHT VENTRICLE RV Basal diam:  3.80 cm RV Mid diam:    3.10 cm RV S prime:     16.80 cm/s TAPSE (M-mode): 2.6 cm LEFT ATRIUM             Index        RIGHT ATRIUM            Index LA diam:        4.50 cm 2.21 cm/m   RA Area:     12.20 cm LA Vol (A2C):   80.7 ml 39.55 ml/m  RA Volume:   24.90 ml  12.20 ml/m LA Vol (A4C):   60.5 ml 29.65 ml/m LA Biplane Vol: 72.6 ml 35.58 ml/m  AORTIC VALVE                     PULMONIC VALVE AV Area (Vmax):    1.25 cm      PV Vmax:       1.54 m/s AV Area (Vmean):   1.24 cm      PV Peak grad:  9.5 mmHg AV Area (VTI):     1.27 cm AV Vmax:  314.00 cm/s AV Vmean:          216.667 cm/s AV VTI:            0.726 m AV Peak Grad:      39.4 mmHg AV Mean Grad:      22.0 mmHg LVOT Vmax:         113.00 cm/s LVOT Vmean:        77.300 cm/s LVOT VTI:          0.267 m LVOT/AV VTI ratio: 0.37  AORTA Ao Root diam: 3.70 cm Ao Asc diam:  3.30 cm MITRAL VALVE                TRICUSPID VALVE MV Area (PHT): 3.15 cm     TR Peak grad:   20.2 mmHg MV Area VTI:   2.39 cm     TR Vmax:        225.00 cm/s MV Peak grad:  6.4 mmHg MV Mean grad:  3.0 mmHg     SHUNTS MV Vmax:       1.26 m/s     Systemic VTI:  0.27 m MV Vmean:      76.0 cm/s    Systemic Diam: 2.10 cm MV Decel Time: 241 msec MV E velocity: 92.20 cm/s MV A velocity: 121.00 cm/s MV E/A ratio:  0.76 Marcina Millard MD Electronically signed by Marcina Millard MD Signature Date/Time: 04/18/2023/5:22:03 PM    Final    DG Chest Portable 1 View  Result Date: 04/18/2023 CLINICAL DATA:  Chest pain EXAM: PORTABLE CHEST 1 VIEW COMPARISON:  12/12/2022 FINDINGS: Borderline heart size that is stable. Mild interstitial coarsening without Kerley line or air bronchogram. No effusion or pneumothorax. Artifact from EKG leads. IMPRESSION: No edema or focal pneumonia. Electronically Signed   By: Tiburcio Pea M.D.   On: 04/18/2023 05:15      ASSESSMENT/PLAN   Suspect thus far the blood is coming from his bronchiectasis, aggravated by the iv heparin. . No radiographic signs of cavitary TB etx. No hx to support pulm embolism -treat witn  mperic antibiotics (doxycycline 100 mg q bid) -no asa,,nsaid -hold sub q  heparin if it get worse -consider as an out patient bronch. I spoke with Dr. Welton Flakes, back tomoorow  -following     Thank you for allowing me to participate in the care of this patient.   Patient/Family are satisfied with care plan and all questions have been answered.  This document was prepared using Dragon voice recognition software and may include unintentional dictation errors.     Ned Clines, M.D.  Division of Pulmonary & Critical Care Medicine  [facility]

## 2023-04-22 NOTE — Plan of Care (Signed)
Patient is participating in goals of care to meet goals for discharge.  Anona Giovannini S Seila Liston, RN     Problem: Education: Goal: Ability to describe self-care measures that may prevent or decrease complications (Diabetes Survival Skills Education) will improve Outcome: Progressing Goal: Individualized Educational Video(s) Outcome: Progressing   Problem: Coping: Goal: Ability to adjust to condition or change in health will improve Outcome: Progressing   Problem: Fluid Volume: Goal: Ability to maintain a balanced intake and output will improve Outcome: Progressing   Problem: Health Behavior/Discharge Planning: Goal: Ability to identify and utilize available resources and services will improve Outcome: Progressing Goal: Ability to manage health-related needs will improve Outcome: Progressing   Problem: Metabolic: Goal: Ability to maintain appropriate glucose levels will improve Outcome: Progressing   Problem: Nutritional: Goal: Maintenance of adequate nutrition will improve Outcome: Progressing Goal: Progress toward achieving an optimal weight will improve Outcome: Progressing   Problem: Skin Integrity: Goal: Risk for impaired skin integrity will decrease Outcome: Progressing   Problem: Tissue Perfusion: Goal: Adequacy of tissue perfusion will improve Outcome: Progressing   Problem: Education: Goal: Knowledge of General Education information will improve Description: Including pain rating scale, medication(s)/side effects and non-pharmacologic comfort measures Outcome: Progressing   Problem: Health Behavior/Discharge Planning: Goal: Ability to manage health-related needs will improve Outcome: Progressing   Problem: Clinical Measurements: Goal: Ability to maintain clinical measurements within normal limits will improve Outcome: Progressing Goal: Will remain free from infection Outcome: Progressing Goal: Diagnostic test results will improve Outcome:  Progressing Goal: Respiratory complications will improve Outcome: Progressing Goal: Cardiovascular complication will be avoided Outcome: Progressing   Problem: Activity: Goal: Risk for activity intolerance will decrease Outcome: Progressing   Problem: Nutrition: Goal: Adequate nutrition will be maintained Outcome: Progressing   Problem: Coping: Goal: Level of anxiety will decrease Outcome: Progressing   Problem: Elimination: Goal: Will not experience complications related to bowel motility Outcome: Progressing Goal: Will not experience complications related to urinary retention Outcome: Progressing   Problem: Pain Managment: Goal: General experience of comfort will improve Outcome: Progressing   Problem: Safety: Goal: Ability to remain free from injury will improve Outcome: Progressing   Problem: Skin Integrity: Goal: Risk for impaired skin integrity will decrease Outcome: Progressing   

## 2023-04-22 NOTE — Care Management Important Message (Signed)
Important Message  Patient Details  Name: Cyson Cogan MRN: [id number] Date of Birth: February 16, 1941   Medicare Important Message Given:  Yes     Olegario Messier A Mcclellan Demarais 04/22/2023, 2:17 PM

## 2023-04-23 DIAGNOSIS — I214 Non-ST elevation (NSTEMI) myocardial infarction: Secondary | ICD-10-CM | POA: Diagnosis not present

## 2023-04-23 LAB — CULTURE, BLOOD (ROUTINE X 2): Special Requests: ADEQUATE

## 2023-04-23 LAB — CBC
HCT: 34.7 % — ABNORMAL LOW (ref 39.0–52.0)
Hemoglobin: 11.4 g/dL — ABNORMAL LOW (ref 13.0–17.0)
MCH: 31.6 pg (ref 26.0–34.0)
MCHC: 32.9 g/dL (ref 30.0–36.0)
MCV: 96.1 fL (ref 80.0–100.0)
Platelets: 317 10*3/uL (ref 150–400)
RBC: 3.61 MIL/uL — ABNORMAL LOW (ref 4.22–5.81)
RDW: 14.2 % (ref 11.5–15.5)
WBC: 15.6 10*3/uL — ABNORMAL HIGH (ref 4.0–10.5)
nRBC: 0 % (ref 0.0–0.2)

## 2023-04-23 LAB — BASIC METABOLIC PANEL
Anion gap: 11 (ref 5–15)
BUN: 64 mg/dL — ABNORMAL HIGH (ref 8–23)
CO2: 22 mmol/L (ref 22–32)
Calcium: 8.7 mg/dL — ABNORMAL LOW (ref 8.9–10.3)
Chloride: 101 mmol/L (ref 98–111)
Creatinine, Ser: 2.57 mg/dL — ABNORMAL HIGH (ref 0.61–1.24)
GFR, Estimated: 24 mL/min — ABNORMAL LOW (ref >60)
Glucose, Bld: 181 mg/dL — ABNORMAL HIGH (ref 70–99)
Potassium: 4 mmol/L (ref 3.5–5.1)
Sodium: 134 mmol/L — ABNORMAL LOW (ref 135–145)

## 2023-04-23 LAB — GLUCOSE, CAPILLARY
Glucose-Capillary: 197 mg/dL — ABNORMAL HIGH (ref 70–99)
Glucose-Capillary: 244 mg/dL — ABNORMAL HIGH (ref 70–99)
Glucose-Capillary: 188 mg/dL — ABNORMAL HIGH (ref 70–99)
Glucose-Capillary: 212 mg/dL — ABNORMAL HIGH (ref 70–99)

## 2023-04-23 LAB — MAGNESIUM: Magnesium: 2.4 mg/dL (ref 1.7–2.4)

## 2023-04-23 MED ORDER — RANOLAZINE ER 500 MG PO TB12
500.0000 mg | ORAL_TABLET | Freq: Every day | ORAL | Status: DC
  Administered 2023-04-24: 500 mg via ORAL
  Filled 2023-04-23: qty 1

## 2023-04-23 MED ORDER — ISOSORBIDE MONONITRATE ER 30 MG PO TB24
30.0000 mg | ORAL_TABLET | Freq: Every day | ORAL | Status: DC
  Administered 2023-04-23: 30 mg via ORAL
  Filled 2023-04-23: qty 1

## 2023-04-23 MED ORDER — CLOPIDOGREL BISULFATE 75 MG PO TABS
75.0000 mg | ORAL_TABLET | Freq: Every day | ORAL | Status: DC
  Administered 2023-04-23 – 2023-04-24 (×2): 75 mg via ORAL
  Filled 2023-04-23 (×2): qty 1

## 2023-04-23 MED ORDER — RANOLAZINE ER 500 MG PO TB12: 500.0000 mg | ORAL_TABLET | Freq: Two times a day (BID) | ORAL | Status: DC

## 2023-04-23 MED ORDER — INSULIN ASPART 100 UNIT/ML IJ SOLN
11.0000 [IU] | Freq: Three times a day (TID) | INTRAMUSCULAR | Status: DC
  Administered 2023-04-24 (×2): 11 [IU] via SUBCUTANEOUS
  Filled 2023-04-23 (×2): qty 1

## 2023-04-23 MED ORDER — RANOLAZINE ER 500 MG PO TB12: 1000.0000 mg | ORAL_TABLET | Freq: Two times a day (BID) | ORAL | Status: DC

## 2023-04-23 MED ORDER — METOPROLOL TARTRATE 25 MG PO TABS
12.5000 mg | ORAL_TABLET | Freq: Two times a day (BID) | ORAL | Status: DC
  Administered 2023-04-23 – 2023-04-24 (×3): 12.5 mg via ORAL
  Filled 2023-04-23 (×3): qty 1

## 2023-04-23 MED ORDER — ISOSORBIDE MONONITRATE ER 60 MG PO TB24
120.0000 mg | ORAL_TABLET | Freq: Every day | ORAL | Status: DC
  Filled 2023-04-23: qty 2

## 2023-04-23 NOTE — Progress Notes (Signed)
PROGRESS NOTE    Mike Decker  VOZ:[id number] DOB: May 02, 1941 DOA: 04/18/2023 PCP: [name], MD  [id number]  LOS: 5 days   Brief hospital course:   Assessment & Plan: [name] is a 82 y.o. male with medical history significant of multiple medical issues including coronary artery disease status post stenting, chronic diastolic heart failure, hypertension, history of CVA, type 2 diabetes, stage IV CKD, gout, COPD presenting with NSTEMI and acute on chronic stage IV CKD.  Patient reports being woke up around 3 AM with severe 10 out of 10 chest pain.    * NSTEMI (non-ST elevated myocardial infarction) (HCC) history of CAD with unstable angina S/p PCI/stent to proximal RCA and proximal circumflex Noted cardiac cath 12/2022 showing multivessel disease  --trop 34 to 801.  S/p 48 hours of heparin gtt Plan: --ok with pulm Dr. Welton Flakes to continue plavix but hold ASA in the setting of mild hemoptysis (ASA and plavix for cardiac indications) --cont statin --cont isosorbide (home dose 120 mg daily) and Ranexa (reduced due to reduced renal function) --cardio rec medical management, hesitant to proceed with AKI w/ hx CKD 4  --Per Dr. Meredeth Ide, Dr. Juliann Pares has agreed to come see pt while inpatient, per pt request.  # Acute on chronic HFpEF # Moderate aortic stenosis Clinically hypervolemic with an oxygen requirement, crackles on exam, and a distended abdomen with a ~9 pound weight gain since the weekend in the setting of decreased dose of his regular diuretic with known renal insufficiency as below.  Chest x-ray with some pulmonary edema, BNP only elevated in the 200s. --received IV lasix 40 BID, transitioned to oral lasix 40 mg daily Plan: --cont lasix 40 mg dialy --reduce home Lopressor to 12.5 mg BID -Previously on Jardiance prior to admission, hold this for now with AKI.  Addition of MRA, ACE/Arni limited by renal insufficiency  Afib w RVR --went into RVR morning of  5/12.  Home Lopressor was reduced on presentation due to hypotension. Plan: --reduce home Lopressor to 12.5 mg BID --cont Cardizem CD 120 mg daily (new) --7-day Holter placed today to assess atrial fibrillation burden before starting chronic anticoagulation, especially in the setting of hemoptysis   Mild hemoptysis  --likely from bronchiectasis, aggravated by the iv heparin while taking home ASA and plavix --CT chest no acute finding, no signs of TB. --pulm consult with Dr. Meredeth Ide who conferred with Dr. Welton Flakes Plan: -cont empiric doxy for a 7-day course --ok with pulm Dr. Welton Flakes to continue plavix but hold ASA in the setting of mild hemoptysis (ASA and plavix for cardiac indications) --consider outpatient bronch with pt's outpatient pulm Dr. Welton Flakes  Leukocytosis White count 18.2 on presentation, trended down without abx.  Likely stress reaction.  Acute renal failure superimposed on stage 4 chronic kidney disease (HCC) Baseline creatinine 2.3-2.9 Creatinine 3.74 on presentation. --nephro consulted --monitor while on diuretic  Metabolic acidosis --cont Na bicarb  Essential hypertension --cont Lopressor, Imdur --cont Cardizem CD 120 mg daily (new)  Type II diabetes mellitus with renal manifestations (HCC) hyperglycemia --recent A1c 7.0, well controlled --cont glargine 27u nightly --increase mealtime to 11u TID --SSI  Hyperlipidemia Cont statin    Anemia of chronic renal failure  Gout Cont allopurinol    Chronic obstructive pulmonary disease (HCC) Stable from a resp standpoint  Cont home inhalers    Hx of TIA (transient ischemic attack) --cont statin --ok with pulm Dr. Welton Flakes to continue plavix but hold ASA in the setting of mild hemoptysis  DVT prophylaxis: Heparin SQ Code Status: Full code  Family Communication:  Level of care: Telemetry Cardiac Dispo:   The patient is from: home Anticipated d/c is to: home Anticipated d/c date is: likely  tomorrow   Subjective and Interval History:  Only little blood in sputum today.  Pt complained of more chest pain.     Objective: Vitals:   04/23/23 1224 04/23/23 1551 04/23/23 1952 04/23/23 2058  BP: 137/67 (!) 139/53  109/84  Pulse: 82 62  75  Resp: 16 18  16   Temp: 98.2 F (36.8 C)   98.2 F (36.8 C)  TempSrc: Oral     SpO2: 100% 100% 97% 100%  Weight:        Intake/Output Summary (Last 24 hours) at 04/23/2023 2148 Last data filed at 04/23/2023 1345 Gross per 24 hour  Intake 769.42 ml  Output --  Net 769.42 ml   Filed Weights   04/18/23 0441  Weight: 95.1 kg    Examination:   Constitutional: NAD, AAOx3 HEENT: conjunctivae and lids normal, EOMI CV: No cyanosis.   RESP: normal respiratory effort, on RA Neuro: II - XII grossly intact.   Psych: Normal mood and affect.  Appropriate judgement and reason   Data Reviewed: I have personally reviewed labs and imaging studies  Time spent: 50 minutes  Darlin Priestly, MD [facility] If 7PM-7AM, please contact night-coverage 04/23/2023, 9:48 PM

## 2023-04-23 NOTE — Progress Notes (Signed)
Pulmonary Medicine          Date: 04/23/2023,   MRN# [id number] Mike Decker     AdmissionWeight: 95.1 kg                 CurrentWeight: 95.1 kg      CHIEF COMPLAINT:   Hemoptysis    HISTORY OF PRESENT ILLNESS   This am had one tiny clot non red blood. No difficulty breathing. No pleurisy. He does have 4/10 anterior chest pain, ms given, afib, cardiazem started.    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, esphageal   Chronic diastolic CHF (congestive heart failure) (HCC)    Colon polyp    Diabetes mellitus without complication (HCC)    Diverticulosis    Gout    Heart disease    Hemangioma    liver   Hyperlipidemia    Hypertension    Myocardial infarct [facility])    Ocular hypertension    Peripheral vascular disease (HCC)    Skin cancer    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HCC)    Sleep apnea    Vitreoretinal degeneration      SURGICAL HISTORY   Past Surgical History:  Procedure Laterality Date   CATARACT EXTRACTION  2011, 2012   COLONOSCOPY N/A 12/14/2018   Procedure: COLONOSCOPY;  Surgeon: Pasty Spillers, MD;  Location: [facility] ENDOSCOPY;  Service: Endoscopy;  Laterality: N/A;   CORONARY ANGIOPLASTY WITH STENT PLACEMENT  12/10/2010   CORONARY STENT INTERVENTION N/A 12/29/2019   Procedure: CORONARY STENT INTERVENTION;  Surgeon: Yvonne Kendall, MD;  Location: [facility] INVASIVE CV LAB;  Service: Cardiovascular;  Laterality: N/A;   ESOPHAGOGASTRODUODENOSCOPY N/A 12/14/2018   Procedure: ESOPHAGOGASTRODUODENOSCOPY (EGD);  Surgeon: Pasty Spillers, MD;  Location: [facility] ENDOSCOPY;  Service: Endoscopy;  Laterality: N/A;   ESOPHAGOGASTRODUODENOSCOPY (EGD) WITH PROPOFOL N/A 06/01/2016   Procedure: ESOPHAGOGASTRODUODENOSCOPY (EGD) WITH PROPOFOL;  Surgeon: Scot Jun, MD;   Location: [facility] ENDOSCOPY;  Service: Endoscopy;  Laterality: N/A;   ESOPHAGOGASTRODUODENOSCOPY (EGD) WITH PROPOFOL N/A 01/31/2023   Procedure: ESOPHAGOGASTRODUODENOSCOPY (EGD) WITH PROPOFOL;  Surgeon: Midge Minium, MD;  Location: [facility] ENDOSCOPY;  Service: Endoscopy;  Laterality: N/A;   FEMORAL ARTERY STENT Right    HERNIA REPAIR     x2   INTRAOCULAR LENS INSERTION     LEFT HEART CATH AND CORONARY ANGIOGRAPHY N/A 05/09/2017   Procedure: Left Heart Cath and Coronary Angiography;  Surgeon: Alwyn Pea, MD;  Location: [facility] INVASIVE CV LAB;  Service: Cardiovascular;  Laterality: N/A;   LEFT HEART CATH AND CORONARY ANGIOGRAPHY N/A 12/08/2018   Procedure: LEFT HEART CATH AND CORONARY ANGIOGRAPHY and possible PCI and stent;  Surgeon: Alwyn Pea, MD;  Location: [facility] INVASIVE CV LAB;  Service: Cardiovascular;  Laterality: N/A;   LEFT HEART CATH AND CORONARY ANGIOGRAPHY N/A 12/29/2019   Procedure: LEFT HEART CATH AND CORONARY ANGIOGRAPHY;  Surgeon: Yvonne Kendall, MD;  Location: [facility] INVASIVE CV LAB;  Service: Cardiovascular;  Laterality: N/A;   LEFT HEART CATH AND CORONARY ANGIOGRAPHY Left 12/13/2022   Procedure: LEFT HEART CATH AND CORONARY ANGIOGRAPHY;  Surgeon: Alwyn Pea, MD;  Location: [facility] INVASIVE CV LAB;  Service: Cardiovascular;  Laterality: Left;   NOSE SURGERY     submucous resection   PROSTATE SURGERY  12/10/2000   ROTATOR CUFF REPAIR Right      FAMILY HISTORY   Family History  Problem Relation Age of Onset   Arthritis Mother    Stroke Maternal Grandfather    Breast cancer Neg Hx      SOCIAL HISTORY   Social History   Tobacco Use   Smoking status: Former    Years: 20    Types: Cigarettes    Quit date: 12/10/1976    Years since quitting: 46.3   Smokeless tobacco: Never  Vaping Use   Vaping Use: Never used  Substance Use Topics   Alcohol use: No   Drug use: No     MEDICATIONS    Home Medication:    Current Medication:  Current  Facility-Administered Medications:    acetaminophen (TYLENOL) tablet 650 mg, 650 mg, Oral, Q6H PRN, Floydene Flock, MD   allopurinol (ZYLOPRIM) tablet 100 mg, 100 mg, Oral, Daily, Floydene Flock, MD, 100 mg at 04/23/23 0952   arformoterol (BROVANA) nebulizer solution 15 mcg, 15 mcg, Nebulization, BID, 15 mcg at 04/23/23 0812 **AND** umeclidinium bromide (INCRUSE ELLIPTA) 62.5 MCG/ACT 1 puff, 1 puff, Inhalation, Daily, Darlin Priestly, MD, 1 puff at 04/23/23 0954   atorvastatin (LIPITOR) tablet 80 mg, 80 mg, Oral, Daily, Tang, Lily Michelle, PA-C, 80 mg at 04/23/23 1610   citalopram (CELEXA) tablet 10 mg, 10 mg, Oral, Daily, Floydene Flock, MD, 10 mg at 04/23/23 9604   diltiazem (CARDIZEM CD) 24 hr capsule 120 mg, 120 mg, Oral, Daily, Tang, Lily Michelle, PA-C, 120 mg at 04/23/23 0951   docusate sodium (COLACE) capsule 100 mg, 100 mg, Oral, BID, Darlin Priestly, MD, 100 mg at 04/23/23 5409   doxycycline (VIBRAMYCIN) 100 mg in sodium chloride 0.9 % 250 mL IVPB, 100 mg, Intravenous, Q12H, Ned Clines E, MD, Last Rate: 125 mL/hr at 04/23/23 0542, Infusion Verify at 04/23/23 0542   furosemide (LASIX) tablet 40 mg, 40 mg, Oral, Daily, Paraschos, Alexander, MD, 40 mg at 04/23/23 0953   heparin injection 5,000 Units, 5,000 Units, Subcutaneous, Q8H, Darlin Priestly, MD, 5,000 Units at 04/23/23 0510   insulin aspart (novoLOG) injection 0-5 Units, 0-5 Units, Subcutaneous, QHS, Floydene Flock, MD, 2 Units at 04/22/23 2120   insulin aspart (novoLOG) injection 0-9 Units, 0-9 Units, Subcutaneous, TID WC, Floydene Flock, MD, 2 Units at 04/23/23 0906   insulin aspart (novoLOG) injection 9 Units, 9 Units, Subcutaneous, TID WC, Darlin Priestly, MD, 9 Units at 04/23/23 0907   insulin glargine-yfgn (SEMGLEE) injection 27 Units, 27 Units, Subcutaneous, QHS, Darlin Priestly, MD, 27 Units at 04/22/23 2126   isosorbide mononitrate (IMDUR) 24 hr tablet 30 mg, 30 mg, Oral, Daily, Tang, Lily Michelle, PA-C, 30 mg at 04/23/23 8119    methocarbamol (ROBAXIN) tablet 500 mg, 500 mg, Oral, TID, Darlin Priestly, MD, 500 mg at 04/23/23 1478   metoprolol tartrate (LOPRESSOR) tablet 12.5 mg, 12.5 mg, Oral, BID, Tang, Cheryln Manly, PA-C, 12.5 mg at 04/23/23 2956   morphine (PF) 2 MG/ML injection 1 mg, 1 mg, Intravenous, Q4H PRN, Darlin Priestly, MD, 1 mg at 04/23/23 1101   nitroGLYCERIN (NITROGLYN) 2 % ointment 1 inch, 1 inch, Topical, Q6H PRN, Darlin Priestly, MD   nitroGLYCERIN (NITROSTAT) SL tablet 0.4 mg, 0.4 mg, Sublingual, Q5 min PRN, Delton Prairie, MD, 0.4 mg at 04/19/23 0957   ondansetron (ZOFRAN) tablet 4 mg, 4 mg, Oral, Q6H PRN **OR** ondansetron (ZOFRAN) injection 4 mg, 4 mg, Intravenous, Q6H PRN, Floydene Flock, MD   polyethylene  glycol (MIRALAX / GLYCOLAX) packet 34 g, 34 g, Oral, BID, Darlin Priestly, MD, 34 g at 04/22/23 2120   ranolazine (RANEXA) 12 hr tablet 500 mg, 500 mg, Oral, Daily, Darlin Priestly, MD, 500 mg at 04/23/23 1610   sodium bicarbonate tablet 650 mg, 650 mg, Oral, BID, Floydene Flock, MD, 650 mg at 04/23/23 9604    ALLERGIES   Gabapentin, Peanut-containing drug products, Penicillins, Bee venom, Influenza vaccines, Inh [isoniazid], Jardiance [empagliflozin], Kenalog [triamcinolone acetonide], Levaquin [levofloxacin], Lisinopril, Nalfon [fenoprofen calcium], Naproxen, Peanut oil, and Nsaids     REVIEW OF SYSTEMS    Review of Systems:  Gen:  Denies  fever, sweats, chills weigh loss  HEENT: Denies blurred vision, double vision, ear pain, eye pain, hearing loss, nose bleeds, sore throat Cardiac:  No dizziness, chest pain or heaviness, chest tightness,edema Resp:   Denies cough or sputum porduction, shortness of breath,wheezing, np hemoptysis,  Gi: Denies swallowing difficulty, stomach pain, nausea or vomiting, diarrhea, constipation, bowel incontinence Gu:  Denies bladder incontinence, burning urine Ext:   Denies Joint pain, stiffness or swelling Skin: Denies  skin rash, easy bruising or bleeding or hives Endoc:  Denies  polyuria, polydipsia , polyphagia or weight change Psych:   Denies depression, insomnia or hallucinations   Other:  All other systems negative   VS: BP (!) 123/44 (BP Location: Left Arm)   Pulse 77   Temp 98.1 F (36.7 C) (Oral)   Resp 18   Wt 95.1 kg   SpO2 98%   BMI 33.84 kg/m      PHYSICAL EXAM    GENERAL:NAD, no fevers, chills, no weakness no fatigue, sitting in chair HEAD: Normocephalic, atraumatic.  EYES: Pupils equal, round, reactive to light. Extraocular muscles intact. No scleral icterus.  MOUTH: Moist mucosal membrane. Dentition intact. No abscess noted.  EAR, NOSE, THROAT: Clear without exudates. No external lesions.  NECK: Supple. No thyromegaly. No nodules. No JVD.  PULMONARY: Distant, no consolidation CARDIOVASCULAR: S1 and S2. Regular rate and rhythm. No murmurs, rubs, or gallops. No edema. Pedal pulses 2+ bilaterally.  GASTROINTESTINAL: Soft, nontender, nondistended. No masses. Positive bowel sounds. No hepatosplenomegaly.  MUSCULOSKELETAL: No swelling, clubbing, or edema. Range of motion full in all extremities.  NEUROLOGIC: Cranial nerves II through XII are intact. No gross focal neurological deficits. Sensation intact. Reflexes intact.  SKIN: No ulceration, lesions, rashes, or cyanosis. Skin warm and dry. Turgor intact.  PSYCHIATRIC: Mood, affect within normal limits. The patient is awake, alert and oriented x 3. Insight, judgment intact.       IMAGING    CT CHEST WO CONTRAST  Result Date: 04/22/2023 CLINICAL DATA:  Hemoptysis EXAM: CT CHEST WITHOUT CONTRAST TECHNIQUE: Multidetector CT imaging of the chest was performed following the standard protocol without IV contrast. RADIATION DOSE REDUCTION: This exam was performed according to the departmental dose-optimization program which includes automated exposure control, adjustment of the mA and/or kV according to patient size and/or use of iterative reconstruction technique. COMPARISON:  Chest x-ray  04/18/2023 and older. Noncontrast high-resolution chest CT 10/20/2020. FINDINGS: Cardiovascular: Calcifications along the aortic valve. The thoracic aorta has a normal course and caliber with mild calcified plaque. There is a bovine type aortic arch, a normal variant. Coronary artery calcifications are seen. The heart itself is nonenlarged. Trace pericardial fluid or thickening. Mediastinum/Nodes: Preserved thyroid gland. The thoracic aorta has a overall normal course and caliber with a small hiatal hernia. No specific abnormal lymph node enlargement identified in the axillary regions, hilum or mediastinum.  Lungs/Pleura: Once again there is some underlying emphysematous changes with peripheral areas of interstitial septal thickening and calcified lung nodules. No consolidation, pneumothorax or effusion. No consolidation. Clear central airways. Upper Abdomen: Adrenal glands are preserved in the upper abdomen. Small splenules. Again 3.4 cm dome liver lesion. This previously has been evaluated as a hemangioma. Musculoskeletal: Mild degenerative changes seen along the spine. IMPRESSION: Stable appearance of the lungs with peripheral areas of interstitial septal thickening, bronchiectasis and underlying emphysematous changes. Evidence of old granulomatous disease. Small hiatal hernia. Aortic Atherosclerosis (ICD10-I70.0) and Emphysema (ICD10-J43.9). Electronically Signed   By: Karen Kays M.D.   On: 04/22/2023 15:37   ECHOCARDIOGRAM COMPLETE  Result Date: 04/18/2023    ECHOCARDIOGRAM REPORT   Patient Name:   Mike Decker Date of Exam: 04/18/2023 Medical Rec #:  [id number]            Height:       66.0 in Accession #:    [id number]           Weight:       209.7 lb Date of Birth:  1941/08/10            BSA:          2.040 m Patient Age:    81 years             BP:           128/58 mmHg Patient Gender: M                    HR:           75 bpm. Exam Location:  [facility] Procedure: 2D Echo, Cardiac Doppler and Color  Doppler Indications:     Aortic Stenosis  History:         Patient has prior history of Echocardiogram examinations, most                  recent 08/15/2022. CHF, CAD, Acute MI and Angina, PAD, TIA and                  COPD, Aortic Valve Disease, Signs/Symptoms:Chest Pain and                  Shortness of Breath; Risk Factors:Hypertension, Sleep Apnea and                  Dyslipidemia. PVD.  Sonographer:     Mikki Harbor Referring Phys:  [id number] LILY MICHELLE TANG Diagnosing Phys: Marcina Millard MD  Sonographer Comments: Technically difficult study due to poor echo windows and suboptimal subcostal window. Image acquisition challenging due to COPD. IMPRESSIONS  1. Left ventricular ejection fraction, by estimation, is 55 to 60%. The left ventricle has normal function. The left ventricle has no regional wall motion abnormalities. Left ventricular diastolic parameters are consistent with Grade I diastolic dysfunction (impaired relaxation).  2. Right ventricular systolic function is normal. The right ventricular size is normal. There is normal pulmonary artery systolic pressure.  3. The mitral valve is normal in structure. Mild to moderate mitral valve regurgitation. No evidence of mitral stenosis.  4. The aortic valve is normal in structure. Aortic valve regurgitation is not visualized. Moderate aortic valve stenosis.  5. The inferior vena cava is normal in size with greater than 50% respiratory variability, suggesting right atrial pressure of 3 mmHg. FINDINGS  Left Ventricle: Left ventricular ejection fraction, by estimation, is 55 to 60%. The left ventricle has normal function.  The left ventricle has no regional wall motion abnormalities. The left ventricular internal cavity size was normal in size. There is  no left ventricular hypertrophy. Left ventricular diastolic parameters are consistent with Grade I diastolic dysfunction (impaired relaxation). Right Ventricle: The right ventricular size is normal. No  increase in right ventricular wall thickness. Right ventricular systolic function is normal. There is normal pulmonary artery systolic pressure. The tricuspid regurgitant velocity is 2.25 m/s, and  with an assumed right atrial pressure of 8 mmHg, the estimated right ventricular systolic pressure is 28.2 mmHg. Left Atrium: Left atrial size was normal in size. Right Atrium: Right atrial size was normal in size. Pericardium: There is no evidence of pericardial effusion. Mitral Valve: The mitral valve is normal in structure. Mild to moderate mitral valve regurgitation. No evidence of mitral valve stenosis. MV peak gradient, 6.4 mmHg. The mean mitral valve gradient is 3.0 mmHg. Tricuspid Valve: The tricuspid valve is normal in structure. Tricuspid valve regurgitation is not demonstrated. No evidence of tricuspid stenosis. Aortic Valve: The aortic valve is normal in structure. Aortic valve regurgitation is not visualized. Moderate aortic stenosis is present. Aortic valve mean gradient measures 22.0 mmHg. Aortic valve peak gradient measures 39.4 mmHg. Aortic valve area, by VTI measures 1.27 cm. Pulmonic Valve: The pulmonic valve was normal in structure. Pulmonic valve regurgitation is not visualized. No evidence of pulmonic stenosis. Aorta: The aortic root is normal in size and structure. Venous: The inferior vena cava is normal in size with greater than 50% respiratory variability, suggesting right atrial pressure of 3 mmHg. IAS/Shunts: No atrial level shunt detected by color flow Doppler.  LEFT VENTRICLE PLAX 2D LVIDd:         3.80 cm   Diastology LVIDs:         2.70 cm   LV e' medial:    5.55 cm/s LV PW:         1.30 cm   LV E/e' medial:  16.6 LV IVS:        1.20 cm   LV e' lateral:   8.49 cm/s LVOT diam:     2.10 cm   LV E/e' lateral: 10.9 LV SV:         92 LV SV Index:   45 LVOT Area:     3.46 cm  RIGHT VENTRICLE RV Basal diam:  3.80 cm RV Mid diam:    3.10 cm RV S prime:     16.80 cm/s TAPSE (M-mode): 2.6 cm LEFT  ATRIUM             Index        RIGHT ATRIUM           Index LA diam:        4.50 cm 2.21 cm/m   RA Area:     12.20 cm LA Vol (A2C):   80.7 ml 39.55 ml/m  RA Volume:   24.90 ml  12.20 ml/m LA Vol (A4C):   60.5 ml 29.65 ml/m LA Biplane Vol: 72.6 ml 35.58 ml/m  AORTIC VALVE                     PULMONIC VALVE AV Area (Vmax):    1.25 cm      PV Vmax:       1.54 m/s AV Area (Vmean):   1.24 cm      PV Peak grad:  9.5 mmHg AV Area (VTI):     1.27 cm AV Vmax:  314.00 cm/s AV Vmean:          216.667 cm/s AV VTI:            0.726 m AV Peak Grad:      39.4 mmHg AV Mean Grad:      22.0 mmHg LVOT Vmax:         113.00 cm/s LVOT Vmean:        77.300 cm/s LVOT VTI:          0.267 m LVOT/AV VTI ratio: 0.37  AORTA Ao Root diam: 3.70 cm Ao Asc diam:  3.30 cm MITRAL VALVE                TRICUSPID VALVE MV Area (PHT): 3.15 cm     TR Peak grad:   20.2 mmHg MV Area VTI:   2.39 cm     TR Vmax:        225.00 cm/s MV Peak grad:  6.4 mmHg MV Mean grad:  3.0 mmHg     SHUNTS MV Vmax:       1.26 m/s     Systemic VTI:  0.27 m MV Vmean:      76.0 cm/s    Systemic Diam: 2.10 cm MV Decel Time: 241 msec MV E velocity: 92.20 cm/s MV A velocity: 121.00 cm/s MV E/A ratio:  0.76 Marcina Millard MD Electronically signed by Marcina Millard MD Signature Date/Time: 04/18/2023/5:22:03 PM    Final    DG Chest Portable 1 View  Result Date: 04/18/2023 CLINICAL DATA:  Chest pain EXAM: PORTABLE CHEST 1 VIEW COMPARISON:  12/12/2022 FINDINGS: Borderline heart size that is stable. Mild interstitial coarsening without Kerley line or air bronchogram. No effusion or pneumothorax. Artifact from EKG leads. IMPRESSION: No edema or focal pneumonia. Electronically Signed   By: Tiburcio Pea M.D.   On: 04/18/2023 05:15      ASSESSMENT/PLAN   Hemoptysis, practically gone this am. Due to  bronchiectasis, aggravated by the iv heparin ( now d/ced). . No radiographic signs of cavitary TB etx. No hx to support pulm embolism -treat witn  emperic  antibiotics (doxycycline 100 mg q bid x 7 days. Can switch to po -he is adamant against steroids -no asa,,nsaid -hold sub q heparin if it get worse -consider as an out patient bronch. ( Recent mi) I spoke with Dr. Welton Flakes, back today -pulmonary wise can go home. Plavix should be ok         Thank you for allowing me to participate in the care of this patient.   Patient/Family are satisfied with care plan and all questions have been answered.  This document was prepared using Dragon voice recognition software and may include unintentional dictation errors.     Ned Clines, M.D.  Division of Pulmonary & Critical Care Medicine  [facility]

## 2023-04-23 NOTE — Progress Notes (Signed)
[facility]  PROGRESS NOTE   Subjective:   Patient had reproducible chest pain this morning.   Blood is less in his productive cough  Objective:  Vital signs: Blood pressure (!) 139/53, pulse 62, temperature 98.2 F (36.8 C), temperature source Oral, resp. rate 18, weight 95.1 kg, SpO2 100 %.  Intake/Output Summary (Last 24 hours) at 04/23/2023 1737 Last data filed at 04/23/2023 1345 Gross per 24 hour  Intake 969.42 ml  Output --  Net 969.42 ml    Filed Weights   04/18/23 0441  Weight: 95.1 kg     Physical Exam: General:  No acute distress  Head:  Normocephalic, atraumatic. Moist oral mucosal membranes  Eyes:  Anicteric  Neck:  Supple  Lungs:   Clear to auscultation, normal effort  Heart:  regular  Abdomen:   Soft, nontender, bowel sounds present  Extremities:  No peripheral edema.  Neurologic:  Awake, alert, following commands  Skin:  No lesions   :     Basic Metabolic Panel: Recent Labs  Lab 04/18/23 0449 04/19/23 0134 04/20/23 0344 04/21/23 0404 04/22/23 0434 04/23/23 0415  NA 130* 133* 133* 133* 131* 134*  K 4.2 4.1 3.8 3.6 3.7 4.0  CL 95* 94* 95* 95* 94* 101  CO2 21* 25 24 24 25 22   GLUCOSE 263* 196* 173* 231* 160* 181*  BUN 78* 80* 73* 74* 69* 64*  CREATININE 3.74* 3.64* 3.09* 3.39* 3.02* 2.57*  CALCIUM 8.3* 8.5* 8.9 8.8* 8.7* 8.7*  MG 2.2  --  2.6* 2.5* 2.5* 2.4    GFR: Estimated Creatinine Clearance: 23.9 mL/min (A) (by C-G formula based on SCr of 2.57 mg/dL (H)).  Liver Function Tests: Recent Labs  Lab 04/18/23 0449 04/19/23 0134  AST 19 39  ALT 13 18  ALKPHOS 49 51  BILITOT 1.0 1.0  PROT 6.2* 6.8  ALBUMIN 3.5 3.8    No results for input(s): "LIPASE", "AMYLASE" in the last 168 hours. No results for input(s): "AMMONIA" in the last 168 hours.  CBC: Recent Labs  Lab 04/18/23 0449 04/19/23 0134 04/20/23 0344 04/21/23 0404 04/22/23 0434 04/23/23 0415  WBC 18.2* 13.4* 10.9* 10.7* 14.0* 15.6*  NEUTROABS 15.5*  --    --   --   --   --   HGB 10.2* 10.4* 10.7* 11.0* 11.3* 11.4*  HCT 31.9* 31.9* 32.7* 32.9* 34.1* 34.7*  MCV 98.8 98.5 96.7 96.5 95.8 96.1  PLT 229 237 266 300 296 317      HbA1C: Hemoglobin A1C  Date/Time Value Ref Range Status  07/18/2021 12:00 AM 8.3  Final   Hgb A1c MFr Bld  Date/Time Value Ref Range Status  04/19/2023 01:34 AM 6.2 (H) 4.8 - 5.6 % Final    Comment:    (NOTE) Pre diabetes:          5.7%-6.4%  Diabetes:              >6.4%  Glycemic control for   <7.0% adults with diabetes   08/15/2022 09:56 AM 7.5 (H) 4.8 - 5.6 % Final    Comment:    (NOTE) Pre diabetes:          5.7%-6.4%  Diabetes:              >6.4%  Glycemic control for   <7.0% adults with diabetes     Urinalysis: No results for input(s): "COLORURINE", "LABSPEC", "PHURINE", "GLUCOSEU", "HGBUR", "BILIRUBINUR", "KETONESUR", "PROTEINUR", "UROBILINOGEN", "NITRITE", "LEUKOCYTESUR" in the last 72 hours.  Invalid input(s): "APPERANCEUR"  Imaging: CT CHEST WO CONTRAST  Result Date: 04/22/2023 CLINICAL DATA:  Hemoptysis EXAM: CT CHEST WITHOUT CONTRAST TECHNIQUE: Multidetector CT imaging of the chest was performed following the standard protocol without IV contrast. RADIATION DOSE REDUCTION: This exam was performed according to the departmental dose-optimization program which includes automated exposure control, adjustment of the mA and/or kV according to patient size and/or use of iterative reconstruction technique. COMPARISON:  Chest x-ray 04/18/2023 and older. Noncontrast high-resolution chest CT 10/20/2020. FINDINGS: Cardiovascular: Calcifications along the aortic valve. The thoracic aorta has a normal course and caliber with mild calcified plaque. There is a bovine type aortic arch, a normal variant. Coronary artery calcifications are seen. The heart itself is nonenlarged. Trace pericardial fluid or thickening. Mediastinum/Nodes: Preserved thyroid gland. The thoracic aorta has a overall normal course  and caliber with a small hiatal hernia. No specific abnormal lymph node enlargement identified in the axillary regions, hilum or mediastinum. Lungs/Pleura: Once again there is some underlying emphysematous changes with peripheral areas of interstitial septal thickening and calcified lung nodules. No consolidation, pneumothorax or effusion. No consolidation. Clear central airways. Upper Abdomen: Adrenal glands are preserved in the upper abdomen. Small splenules. Again 3.4 cm dome liver lesion. This previously has been evaluated as a hemangioma. Musculoskeletal: Mild degenerative changes seen along the spine. IMPRESSION: Stable appearance of the lungs with peripheral areas of interstitial septal thickening, bronchiectasis and underlying emphysematous changes. Evidence of old granulomatous disease. Small hiatal hernia. Aortic Atherosclerosis (ICD10-I70.0) and Emphysema (ICD10-J43.9). Electronically Signed   By: Karen Kays M.D.   On: 04/22/2023 15:37     Medications:    doxycycline (VIBRAMYCIN) IV 125 mL/hr at 04/23/23 0542    allopurinol  100 mg Oral Daily   arformoterol  15 mcg Nebulization BID   And   umeclidinium bromide  1 puff Inhalation Daily   atorvastatin  80 mg Oral Daily   citalopram  10 mg Oral Daily   clopidogrel  75 mg Oral Daily   diltiazem  120 mg Oral Daily   docusate sodium  100 mg Oral BID   furosemide  40 mg Oral Daily   heparin injection (subcutaneous)  5,000 Units Subcutaneous Q8H   insulin aspart  0-5 Units Subcutaneous QHS   insulin aspart  0-9 Units Subcutaneous TID WC   insulin aspart  9 Units Subcutaneous TID WC   insulin glargine-yfgn  27 Units Subcutaneous QHS   isosorbide mononitrate  30 mg Oral Daily   methocarbamol  500 mg Oral TID   metoprolol tartrate  12.5 mg Oral BID   polyethylene glycol  34 g Oral BID   [START ON 04/24/2023] ranolazine  500 mg Oral Daily   sodium bicarbonate  650 mg Oral BID    Assessment/ Plan:     82 y.o.  male with past medical  conditions including type 2 diabetes, CVA, chronic diastolic heart failure, CAD, COPD, and chronic kidney disease stage IV.  Patient presents to the emergency department complaining of chest pain that radiates to his jaw and left arm.  Patient has been admitted for Other chest pain [R07.89] NSTEMI (non-ST elevated myocardial infarction) (HCC) [I21.4]   #1: Acute kidney injury on chronic kidney disease stage IV: renal function continues to improve   #2: Anemia of chronic kidney disease: Presently hemoglobin stable.    #3: Diabetes mellitus type II with chronic kidney disease: continue glucose control.    #4: Metabolic acidosis: chronic. Will continue the sodium bicarbonate at 650 mg twice a day.   #  5: Hypertension/coronary artery disease/congestive heart failure.: - fluid restriction - isosorbide mononitrate, metoprolol, furosemide and started this hospitalization: diltiazem.  Appreciate cardiology input.    LOS: 5 Lamont Dowdy, MD [facility] 5/14/20245:37 PM

## 2023-04-23 NOTE — Progress Notes (Signed)
[facility] Card[facility]  Patient ID: Mike Decker MRN: [id number] DOB/AGE: Feb 21, 1941 82 y.o.   Admit date: 04/18/2023 Referring Physician Dr. Andrena Mews Primary Physician Dr. Harl Bowie Primary Cardiologist Dr. 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  Cardiology is consulted for further assistance.   Interval History:  - s/p CT chest which showed bronchiectasis, evaluated by pulmonology and started on doxycycline -Hgb stable -Reported 4/10 short-lived "chest pain" that he describes as a sharp which resolved with morphine. It is also reproducible to palpation of his lower sternum and pleuritic in nature, inconsistent with cardiac chest pain -Remains in sinus rhythm on telemetry   Vitals:   04/23/23 0200 04/23/23 0355 04/23/23 0809 04/23/23 0812  BP:  (!) 158/68 (!) 123/44   Pulse:  70 78 77  Resp: 16 18 14 18   Temp:  98.3 F (36.8 C) 98.1 F (36.7 C)   TempSrc:   Oral   SpO2: 99% 98% 99% 98%  Weight:         Intake/Output Summary (Last 24 hours) at 04/23/2023 0816 Last data filed at 04/23/2023 0542 Gross per 24 hour  Intake 249.42 ml  Output 650 ml  Net -400.58 ml       PHYSICAL EXAM General: Elderly conversant and chronically ill appearing Caucasian male, sitting upright in recliner in PCU HEENT:  Normocephalic and atraumatic.  Hard of hearing Neck:   No JVD.  Chest: Reproducible tenderness to palpation along lower sternum.  Holter monitor placed on right upper chest Lungs: Normal respiratory effort on room air.  Trace rhonchi without appreciable wheezes or crackles.   Heart: HRRR .  Normal S1 and S2 without gallops or murmurs.  Abdomen: Mildly distended appearing with excess adiposity. Msk: Normal strength and tone for age. Extremities: Warm and well perfused. No clubbing, cyanosis. No peripheral edema.  Neuro: Alert and oriented X 3. Psych:  Answers questions appropriately.    LABS: Basic Metabolic Panel: Recent Labs    04/22/23 0434 04/23/23 0415  NA 131* 134*  K 3.7 4.0  CL 94* 101  CO2 25 22  GLUCOSE 160* 181*  BUN 69* 64*  CREATININE 3.02* 2.57*  CALCIUM 8.7* 8.7*  MG 2.5* 2.4    Liver Function Tests: No results for input(s): "AST", "ALT", "ALKPHOS", "BILITOT", "PROT", "ALBUMIN" in the last 72 hours.  No results for input(s): "LIPASE", "AMYLASE" in the last 72 hours. CBC: Recent Labs    04/22/23 0434 04/23/23 0415  WBC 14.0* 15.6*  HGB 11.3* 11.4*  HCT 34.1* 34.7*  MCV 95.8 96.1  PLT 296 317    Cardiac Enzymes: No results for input(s): "CKTOTAL", "CKMB", "CKMBINDEX", "TROPONINI" in the last 72 hours. BNP: Invalid input(s): "POCBNP" D-Dimer: No results for input(s): "DDIMER" in the last 72 hours. Hemoglobin A1C: No results for input(s): "HGBA1C" in the last 72 hours.  Fasting Lipid Panel: No results for input(s): "CHOL", "HDL", "LDLCALC", "TRIG", "CHOLHDL", "LDLDIRECT" in the last 72 hours. Thyroid Function Tests: No results for input(s): "TSH", "T4TOTAL", "T3FREE", "THYROIDAB" in the last 72 hours.  Invalid input(s): "FREET3" Anemia Panel: No results for  input(s): "VITAMINB12", "FOLATE", "FERRITIN", "TIBC", "IRON", "RETICCTPCT" in the last 72 hours.  CT CHEST WO CONTRAST  Result Date: 04/22/2023 CLINICAL DATA:  Hemoptysis EXAM: CT CHEST WITHOUT CONTRAST TECHNIQUE: Multidetector CT imaging of the chest was performed following the standard protocol without IV contrast. RADIATION DOSE REDUCTION: This exam was performed according to the departmental dose-optimization program which includes automated exposure control, adjustment of  the mA and/or kV according to patient size and/or use of iterative reconstruction technique. COMPARISON:  Chest x-ray 04/18/2023 and older. Noncontrast high-resolution chest CT 10/20/2020. FINDINGS: Cardiovascular: Calcifications along the aortic valve. The thoracic aorta has a normal course and caliber with mild calcified plaque. There is a bovine type aortic arch, a normal variant. Coronary artery calcifications are seen. The heart itself is nonenlarged. Trace pericardial fluid or thickening. Mediastinum/Nodes: Preserved thyroid gland. The thoracic aorta has a overall normal course and caliber with a small hiatal hernia. No specific abnormal lymph node enlargement identified in the axillary regions, hilum or mediastinum. Lungs/Pleura: Once again there is some underlying emphysematous changes with peripheral areas of interstitial septal thickening and calcified lung nodules. No consolidation, pneumothorax or effusion. No consolidation. Clear central airways. Upper Abdomen: Adrenal glands are preserved in the upper abdomen. Small splenules. Again 3.4 cm dome liver lesion. This previously has been evaluated as a hemangioma. Musculoskeletal: Mild degenerative changes seen along the spine. IMPRESSION: Stable appearance of the lungs with peripheral areas of interstitial septal thickening, bronchiectasis and underlying emphysematous changes. Evidence of old granulomatous disease. Small hiatal hernia. Aortic Atherosclerosis (ICD10-I70.0) and Emphysema (ICD10-J43.9). Electronically Signed   By: Karen Kays M.D.   On: 04/22/2023 15:37     Echo   Normal left ventricular function, EF 55-60%, moderate aortic stenosis (AVA 1.27 cm, peak velocity 3.14 m/s, peak gradient 39.4 mmHg, mean gradient 22.0 mmHg)  04/18/2023   TELEMETRY: Sinus rhythm to sinus tach rate average in 70s-80s with brief period up to 110s  ASSESSMENT AND PLAN:  Principal Problem:   NSTEMI (non-ST elevated myocardial infarction) (HCC) Active  Problems:   TIA (transient ischemic attack)   Essential hypertension   Hyperlipidemia   Anemia of chronic renal failure, stage 4 (severe) (HCC)   Chronic diastolic heart failure (HCC)   Chronic obstructive pulmonary disease (HCC)   Gout   Type II diabetes mellitus with renal manifestations (HCC)   Acute renal failure superimposed on stage 4 chronic kidney disease (HCC)   Leukocytosis    1. NSTEMI (90, 454, 801, 782) with known underlying CAD, with cardiac cath 12/13/2022 revealing patent stents proximal left circumflex and RCA, s/p heparin for 48 hours, with normal LV function on 2D echocardiogram.  2.  Acute on chronic HFpEF, EF 55-60%, on furosemide 40 mg IV twice daily, with continued good diuresis, good medical management limited by CKD stage IV 3.  Moderate aortic stenosis (AVA 1.27, peak velocity 3.14 m/s, peak gradient 39.4 mmHg, mean gradient 22.0 mmHg) 4.  AKI on CKD stage IV, BUN and creatinine 69/3.02 and GFR 20 5.  Paroxysmal atrial fibrillation, relatively asymptomatic, predominant underlying NSR 6.  Hemoptysis, with stable H/H 11.3/4.1, CT chest showed bronchiectasis, started on doxycycline by pulmonology   Recommendations   1.  Agree with current therapy 2.  Ok with pulm to stop aspirin and continue plavix 75mg  daily  3.  7-day Holter placed today to assess atrial fibrillation burden before starting chronic anticoagulation, especially in the setting of hemoptysis 3.  Continue furosemide p.o. 40 mg daily 4.  Continue to monitor  renal status 5.  Continue metoprolol tartrate 12.5 mg daily 6.  Continue Cardizem CD 120 mg daily 7.  Continue isosorbide mononitrate 80 mg daily and Ranexa 500 mg daily with renal insufficiency 8.  Continue to defer cardiac catheterization at this time, especially in light of underlying chronic kidney disease, and patient undergone recent cardiac catheterization 12/13/2022 which revealed patent coronary stents  Ok for discharge today from a cardiac  perspective. Will arrange for follow up in clinic with Dr. Juliann Pares in 1-2 weeks.    This patient's plan of care was discussed and created with Dr. Darrold Junker and he is in agreement.    Rebeca Allegra, PA-C 04/23/2023 8:16 AM

## 2023-04-23 NOTE — Progress Notes (Signed)
  Transition of Care [facility]) Screening Note   Patient Details  Name: Mike Decker Date of Birth: 1941/07/10   Transition of Care [facility]) CM/SW Contact:    Truddie Hidden, RN Phone Number: 04/23/2023, 12:20 PM    Transition of Care Department [facility]) has reviewed patient and no TOC needs have been identified at this time. We will continue to monitor patient advancement through interdisciplinary progression rounds. If new patient transition needs arise, please place a TOC consult.

## 2023-04-24 DIAGNOSIS — J471 Bronchiectasis with (acute) exacerbation: Secondary | ICD-10-CM

## 2023-04-24 DIAGNOSIS — I480 Paroxysmal atrial fibrillation: Secondary | ICD-10-CM | POA: Insufficient documentation

## 2023-04-24 DIAGNOSIS — N170 Acute kidney failure with tubular necrosis: Secondary | ICD-10-CM | POA: Diagnosis not present

## 2023-04-24 DIAGNOSIS — I214 Non-ST elevation (NSTEMI) myocardial infarction: Secondary | ICD-10-CM | POA: Diagnosis not present

## 2023-04-24 DIAGNOSIS — R042 Hemoptysis: Secondary | ICD-10-CM | POA: Insufficient documentation

## 2023-04-24 DIAGNOSIS — E669 Obesity, unspecified: Secondary | ICD-10-CM | POA: Insufficient documentation

## 2023-04-24 DIAGNOSIS — I350 Nonrheumatic aortic (valve) stenosis: Secondary | ICD-10-CM

## 2023-04-24 DIAGNOSIS — I5033 Acute on chronic diastolic (congestive) heart failure: Secondary | ICD-10-CM

## 2023-04-24 DIAGNOSIS — J479 Bronchiectasis, uncomplicated: Secondary | ICD-10-CM

## 2023-04-24 LAB — CBC
HCT: 33.2 % — ABNORMAL LOW (ref 39.0–52.0)
Hemoglobin: 10.9 g/dL — ABNORMAL LOW (ref 13.0–17.0)
MCH: 31.8 pg (ref 26.0–34.0)
MCHC: 32.8 g/dL (ref 30.0–36.0)
MCV: 96.8 fL (ref 80.0–100.0)
Platelets: 311 10*3/uL (ref 150–400)
RBC: 3.43 MIL/uL — ABNORMAL LOW (ref 4.22–5.81)
RDW: 14.5 % (ref 11.5–15.5)
WBC: 15.6 10*3/uL — ABNORMAL HIGH (ref 4.0–10.5)
nRBC: 0 % (ref 0.0–0.2)

## 2023-04-24 LAB — BASIC METABOLIC PANEL
Anion gap: 8 (ref 5–15)
BUN: 60 mg/dL — ABNORMAL HIGH (ref 8–23)
CO2: 23 mmol/L (ref 22–32)
Calcium: 8.7 mg/dL — ABNORMAL LOW (ref 8.9–10.3)
Chloride: 103 mmol/L (ref 98–111)
Creatinine, Ser: 2.43 mg/dL — ABNORMAL HIGH (ref 0.61–1.24)
GFR, Estimated: 26 mL/min — ABNORMAL LOW (ref >60)
Glucose, Bld: 179 mg/dL — ABNORMAL HIGH (ref 70–99)
Potassium: 4.2 mmol/L (ref 3.5–5.1)
Sodium: 134 mmol/L — ABNORMAL LOW (ref 135–145)

## 2023-04-24 LAB — GLUCOSE, CAPILLARY
Glucose-Capillary: 222 mg/dL — ABNORMAL HIGH (ref 70–99)
Glucose-Capillary: 272 mg/dL — ABNORMAL HIGH (ref 70–99)

## 2023-04-24 LAB — MAGNESIUM: Magnesium: 2.2 mg/dL (ref 1.7–2.4)

## 2023-04-24 MED ORDER — ISOSORBIDE MONONITRATE ER 60 MG PO TB24: 60.0000 mg | ORAL_TABLET | Freq: Every day | ORAL | 0 refills | Status: DC

## 2023-04-24 MED ORDER — ISOSORBIDE MONONITRATE ER 60 MG PO TB24
60.0000 mg | ORAL_TABLET | Freq: Every day | ORAL | Status: DC
  Administered 2023-04-24: 60 mg via ORAL

## 2023-04-24 MED ORDER — DILTIAZEM HCL ER COATED BEADS 120 MG PO CP24: 120.0000 mg | ORAL_CAPSULE | Freq: Every day | ORAL | 0 refills | Status: DC

## 2023-04-24 MED ORDER — RANOLAZINE ER 500 MG PO TB12: 500.0000 mg | ORAL_TABLET | Freq: Every day | ORAL | 0 refills | Status: DC

## 2023-04-24 MED ORDER — ATORVASTATIN CALCIUM 80 MG PO TABS: 80.0000 mg | ORAL_TABLET | Freq: Every day | ORAL | 0 refills | Status: DC

## 2023-04-24 MED ORDER — FUROSEMIDE 20 MG PO TABS: 40.0000 mg | ORAL_TABLET | Freq: Every day | ORAL | 0 refills | Status: DC

## 2023-04-24 MED ORDER — METOPROLOL TARTRATE 25 MG PO TABS: 12.5000 mg | ORAL_TABLET | Freq: Two times a day (BID) | ORAL | 0 refills | Status: DC

## 2023-04-24 MED ORDER — DOXYCYCLINE HYCLATE 100 MG PO TBEC: 200.0000 mg | DELAYED_RELEASE_TABLET | Freq: Two times a day (BID) | ORAL | 0 refills | Status: AC

## 2023-04-24 NOTE — Plan of Care (Signed)
Patient is participating in goals of care to meet goals for discharge.  Abdikadir Fohl S Wilford Merryfield, RN     Problem: Education: Goal: Ability to describe self-care measures that may prevent or decrease complications (Diabetes Survival Skills Education) will improve Outcome: Progressing Goal: Individualized Educational Video(s) Outcome: Progressing   Problem: Coping: Goal: Ability to adjust to condition or change in health will improve Outcome: Progressing   Problem: Fluid Volume: Goal: Ability to maintain a balanced intake and output will improve Outcome: Progressing   Problem: Health Behavior/Discharge Planning: Goal: Ability to identify and utilize available resources and services will improve Outcome: Progressing Goal: Ability to manage health-related needs will improve Outcome: Progressing   Problem: Metabolic: Goal: Ability to maintain appropriate glucose levels will improve Outcome: Progressing   Problem: Nutritional: Goal: Maintenance of adequate nutrition will improve Outcome: Progressing Goal: Progress toward achieving an optimal weight will improve Outcome: Progressing   Problem: Skin Integrity: Goal: Risk for impaired skin integrity will decrease Outcome: Progressing   Problem: Tissue Perfusion: Goal: Adequacy of tissue perfusion will improve Outcome: Progressing   Problem: Education: Goal: Knowledge of General Education information will improve Description: Including pain rating scale, medication(s)/side effects and non-pharmacologic comfort measures Outcome: Progressing   Problem: Health Behavior/Discharge Planning: Goal: Ability to manage health-related needs will improve Outcome: Progressing   Problem: Clinical Measurements: Goal: Ability to maintain clinical measurements within normal limits will improve Outcome: Progressing Goal: Will remain free from infection Outcome: Progressing Goal: Diagnostic test results will improve Outcome:  Progressing Goal: Respiratory complications will improve Outcome: Progressing Goal: Cardiovascular complication will be avoided Outcome: Progressing   Problem: Activity: Goal: Risk for activity intolerance will decrease Outcome: Progressing   Problem: Nutrition: Goal: Adequate nutrition will be maintained Outcome: Progressing   Problem: Coping: Goal: Level of anxiety will decrease Outcome: Progressing   Problem: Elimination: Goal: Will not experience complications related to bowel motility Outcome: Progressing Goal: Will not experience complications related to urinary retention Outcome: Progressing   Problem: Pain Managment: Goal: General experience of comfort will improve Outcome: Progressing   Problem: Safety: Goal: Ability to remain free from injury will improve Outcome: Progressing   Problem: Skin Integrity: Goal: Risk for impaired skin integrity will decrease Outcome: Progressing   

## 2023-04-24 NOTE — Discharge Summary (Addendum)
Physician Discharge Summary   Patient: Mike Decker MRN: [id number] DOB: 02/04/1941  Admit date:     04/18/2023  Discharge date: 04/24/23  Discharge Physician: Marrion Coy   PCP: [name], MD   Recommendations at discharge:   Follow-up with PCP in 1 week. Check a BMP at next office visit. Follow-up with nephrology in 2 weeks. Follow-up with cardiology in 1 to 2 weeks. Follow-up with pulmonology as previous scheduled.  Discharge Diagnoses: Principal Problem:   NSTEMI (non-ST elevated myocardial infarction) (HCC) Active Problems:   Acute renal failure superimposed on stage 4 chronic kidney disease (HCC)   Leukocytosis   Acute on chronic diastolic CHF (congestive heart failure) (HCC)   Essential hypertension   Type II diabetes mellitus with renal manifestations (HCC)   Hyperlipidemia   Anemia of chronic renal failure, stage 4 (severe) (HCC)   Gout   TIA (transient ischemic attack)   Chronic obstructive pulmonary disease (HCC)   Obesity (BMI 30-39.9)   Moderate aortic stenosis   Paroxysmal atrial fibrillation with RVR (HCC)   Bronchiectasis (HCC)   Hemoptysis Bronchiectasis with acute exacerbation. Resolved Problems:   * No resolved hospital problems. *  Hospital Course: Mike Decker is a 82 y.o. male with medical history significant of multiple medical issues including coronary artery disease status post stenting, chronic diastolic heart failure, hypertension, history of CVA, type 2 diabetes, stage IV CKD, gout, COPD presenting with NSTEMI and acute on chronic stage IV CKD.  Patient reports being woke up around 3 AM with severe 10 out of 10 chest pain.  Patient is a followed by cardiology, prior cardiac cath showed multiple vessel disease, decision was made to treat medically.  Patient received 48 hours of heparin drip.  Patient was also continued on isosorbide and Ranexa. Patient developed hemoptysis while in the hospital due to bronchiectasis, seen by  pulmonology.  Patient was not able to start anticoagulation or aspirin.  Will continue Plavix at this time.  Assessment and Plan: * NSTEMI (non-ST elevated myocardial infarction) (HCC) history of CAD with unstable angina S/p PCI/stent to proximal RCA and proximal circumflex Noted cardiac cath 12/2022 showing multivessel disease  --trop 34 to 801.  S/p 48 hours of heparin gtt Patient condition had improved, isosorbide was decreased in dose due to low blood pressure, Ranexa was decreased in dose due to renal function. Continue high-dose statin.  Continue Plavix, hold off aspirin due to hemoptysis. Patient conditions of hide improved, patient be followed by cardiology as outpatient.   # Acute on chronic HFpEF # Moderate aortic stenosis Patient volume status much improved, currently on 40 mg oral Lasix.  Doing well.  Follow-up with cardiology as outpatient.   Afib w RVR Heart rate under control, will continue diltiazem and beta-blocker.   Mild hemoptysis  Bronchiectasis. Seen by neurology, mild hemoptysis was due to bronchiectasis.  Patient has some leukocytosis, will complete 7 days of high-dose doxycycline.   Acute renal failure superimposed on stage 4 chronic kidney disease (HCC) Renal function much improved, patient be followed by nephrology as outpatient.  Recheck BMP in the next office visit.  Metabolic acidosis Condition improved.   Essential hypertension Continue beta-blocker with reduced dose, calcium channel blocker and Imdur.   Type II diabetes mellitus with renal manifestations (HCC) hyperglycemia --recent A1c 7.0, well controlled Resume home regimen.   Hyperlipidemia Cont statin      Anemia of chronic renal failure With nephrology as outpatient. Gout Cont allopurinol      Chronic obstructive  pulmonary disease (HCC) Stable from a resp standpoint  Cont home inhalers      Hx of TIA (transient ischemic attack) --cont statin --ok with pulm Dr. Welton Flakes to continue  plavix but hold ASA in the setting of mild hemoptysis          Consultants: Cardiology, pulmonology, nephrology. Procedures performed: None  Disposition: Home Diet recommendation:  Discharge Diet Orders (From admission, onward)     Start     Ordered   04/24/23 0000  Diet - low sodium heart healthy        04/24/23 1054           Cardiac diet DISCHARGE MEDICATION: Allergies as of 04/24/2023       Reactions   Gabapentin    Other reaction(s): Other (See Comments) Tremors   Peanut-containing Drug Products Anaphylaxis   Penicillins Hives, Rash   Has patient had a PCN reaction causing immediate rash, facial/tongue/throat swelling, SOB or lightheadedness with hypotension: Yes Has patient had a PCN reaction causing severe rash involving mucus membranes or skin necrosis: No Has patient had a PCN reaction that required hospitalization No Has patient had a PCN reaction occurring within the last 10 years: No If all of the above answers are "NO", then may proceed with Cephalosporin use.   Bee Venom Swelling   Influenza Vaccines Hives   Inh [isoniazid] Hives   Jardiance [empagliflozin] Other (See Comments)   Diarrhea   Kenalog [triamcinolone Acetonide] Hives   Levaquin [levofloxacin] Other (See Comments)   Tendon, ligament pain.    Lisinopril    Other reaction(s): Hyperkalemia   Nalfon [fenoprofen Calcium] Hives   Naproxen    Peanut Oil    Nsaids Rash   Nalfon 600 Nalfon 600        Medication List     STOP taking these medications    amLODipine 2.5 MG tablet Commonly known as: NORVASC   aspirin EC 81 MG tablet   rosuvastatin 10 MG tablet Commonly known as: CRESTOR       TAKE these medications    acetaminophen 500 MG tablet Commonly known as: TYLENOL Take 1,000 mg by mouth every 8 (eight) hours as needed for mild pain.   allopurinol 100 MG tablet Commonly known as: ZYLOPRIM Take 100 mg by mouth daily.   atorvastatin 80 MG tablet Commonly known as:  LIPITOR Take 1 tablet (80 mg total) by mouth daily. Start taking on: Apr 25, 2023   azelastine 0.1 % nasal spray Commonly known as: ASTELIN USE 1 TO 2 SPRAYS IN EACH NOSTRIL TWICE A DAY   calcitRIOL 0.25 MCG capsule Commonly known as: ROCALTROL Take 0.25 mcg by mouth daily.   cetirizine 5 MG tablet Commonly known as: ZYRTEC Take 5 mg by mouth daily.   citalopram 10 MG tablet Commonly known as: CELEXA Take 1 tablet (10 mg total) by mouth daily.   clopidogrel 75 MG tablet Commonly known as: PLAVIX TAKE 1 TABLET BY MOUTH EVERY DAY   colchicine 0.6 MG tablet Take 3 tablets (1.8 mg total) by mouth daily as needed (gout flares).   [employer] VITAMIN B12 1000 MCG tablet Generic drug: cyanocobalamin TAKE 1 TABLET BY MOUTH EVERY DAY   Denta 5000 Plus 1.1 % Crea dental cream Generic drug: sodium fluoride See admin instructions.   diltiazem 120 MG 24 hr capsule Commonly known as: CARDIZEM CD Take 1 capsule (120 mg total) by mouth daily. Start taking on: Apr 25, 2023   doxycycline 100 MG EC tablet Commonly  known as: DORYX Take 2 tablets (200 mg total) by mouth 2 (two) times daily for 5 days.   EPINEPHrine 0.3 mg/0.3 mL Soaj injection Commonly known as: EPI-PEN Inject into the muscle as needed (anaphylaxis).   ergocalciferol 1.25 MG (50000 UT) capsule Commonly known as: VITAMIN D2 Take 50,000 Units by mouth every 30 (thirty) days.   ferrous sulfate 325 (65 FE) MG EC tablet TAKE 1 TABLET BY MOUTH EVERY DAY   fluticasone 50 MCG/ACT nasal spray Commonly known as: FLONASE USE 1 SPRAY IN EACH NOSTRIL EVERY DAY   folic acid 400 MCG tablet Commonly known as: FOLVITE Take 400 mcg by mouth daily.   furosemide 20 MG tablet Commonly known as: LASIX Take 2 tablets (40 mg total) by mouth daily. What changed: how much to take   hydrALAZINE 25 MG tablet Commonly known as: APRESOLINE Take 25 mg by mouth 3 (three) times daily.   insulin aspart 100 UNIT/ML FlexPen Commonly known  as: NOVOLOG 10 UNITS IN AM, 30 UNITS AT LUNCH, 30 UNITS AT DINNER   insulin glargine 100 UNIT/ML injection Commonly known as: LANTUS Inject 0.35 mLs (35 Units total) into the skin at bedtime. This is a decrease from your previous 45 units nightly. What changed:  how much to take additional instructions   isosorbide mononitrate 60 MG 24 hr tablet Commonly known as: IMDUR Take 1 tablet (60 mg total) by mouth daily. Start taking on: Apr 25, 2023 What changed:  medication strength how much to take   Jardiance 10 MG Tabs tablet Generic drug: empagliflozin Take 10 mg by mouth daily.   magnesium oxide 400 (240 Mg) MG tablet Commonly known as: MAG-OX Take 1 tablet by mouth daily.   methocarbamol 500 MG tablet Commonly known as: ROBAXIN Take 500 mg by mouth at bedtime.   metoprolol tartrate 25 MG tablet Commonly known as: LOPRESSOR Take 0.5 tablets (12.5 mg total) by mouth 2 (two) times daily. What changed:  medication strength how much to take   mometasone 220 MCG/ACT inhaler Commonly known as: ASMANEX Inhale 2 puffs into the lungs daily.   montelukast 10 MG tablet Commonly known as: SINGULAIR TAKE 1 TABLET BY MOUTH EVERYDAY AT BEDTIME   nitroGLYCERIN 0.4 MG SL tablet Commonly known as: NITROSTAT Place 0.4 mg under the tongue every 5 (five) minutes x 3 doses as needed for chest pain.   pantoprazole 40 MG tablet Commonly known as: PROTONIX Take 1 tablet (40 mg total) by mouth 2 (two) times daily before a meal.   ranolazine 500 MG 12 hr tablet Commonly known as: RANEXA Take 1 tablet (500 mg total) by mouth daily. Start taking on: Apr 25, 2023 What changed:  how much to take when to take this   sodium bicarbonate 650 MG tablet Take 650 mg by mouth 2 (two) times daily.   Stiolto Respimat 2.5-2.5 MCG/ACT Aers Generic drug: Tiotropium Bromide-Olodaterol Inhale 1 each into the lungs daily.        Follow-up Information     Alwyn Pea, MD. Go in 1  week(s).   Specialties: Cardiology, Internal Medicine Why: Appointment on Friday, 05/03/2023 at 10:15am. Contact information: [address] [place] [address] [phone]         [name], MD Follow up in 1 week(s).   Specialties: Internal Medicine, Cardiology Contact information: [address] [place] [address] [phone]                Discharge Exam: Ceasar Mons Weights  04/18/23 0441 04/24/23 0550  Weight: 95.1 kg 86.6 kg   General exam: Appears calm and comfortable  Respiratory system: Clear to auscultation. Respiratory effort normal. Cardiovascular system: S1 & S2 heard, RRR. No JVD, murmurs, rubs, gallops or clicks. No pedal edema. Gastrointestinal system: Abdomen is nondistended, soft and nontender. No organomegaly or masses felt. Normal bowel sounds heard. Central nervous system: Alert and oriented. No focal neurological deficits. Extremities: Symmetric 5 x 5 power. Skin: No rashes, lesions or ulcers Psychiatry: Judgement and insight appear normal. Mood & affect appropriate.    Condition at discharge: good  The results of significant diagnostics from this hospitalization (including imaging, microbiology, ancillary and laboratory) are listed below for reference.   Imaging Studies: CT CHEST WO CONTRAST  Result Date: 04/22/2023 CLINICAL DATA:  Hemoptysis EXAM: CT CHEST WITHOUT CONTRAST TECHNIQUE: Multidetector CT imaging of the chest was performed following the standard protocol without IV contrast. RADIATION DOSE REDUCTION: This exam was performed according to the departmental dose-optimization program which includes automated exposure control, adjustment of the mA and/or kV according to patient size and/or use of iterative reconstruction technique. COMPARISON:  Chest x-ray 04/18/2023 and older. Noncontrast high-resolution chest CT 10/20/2020. FINDINGS: Cardiovascular: Calcifications along the aortic valve. The thoracic aorta has a normal course  and caliber with mild calcified plaque. There is a bovine type aortic arch, a normal variant. Coronary artery calcifications are seen. The heart itself is nonenlarged. Trace pericardial fluid or thickening. Mediastinum/Nodes: Preserved thyroid gland. The thoracic aorta has a overall normal course and caliber with a small hiatal hernia. No specific abnormal lymph node enlargement identified in the axillary regions, hilum or mediastinum. Lungs/Pleura: Once again there is some underlying emphysematous changes with peripheral areas of interstitial septal thickening and calcified lung nodules. No consolidation, pneumothorax or effusion. No consolidation. Clear central airways. Upper Abdomen: Adrenal glands are preserved in the upper abdomen. Small splenules. Again 3.4 cm dome liver lesion. This previously has been evaluated as a hemangioma. Musculoskeletal: Mild degenerative changes seen along the spine. IMPRESSION: Stable appearance of the lungs with peripheral areas of interstitial septal thickening, bronchiectasis and underlying emphysematous changes. Evidence of old granulomatous disease. Small hiatal hernia. Aortic Atherosclerosis (ICD10-I70.0) and Emphysema (ICD10-J43.9). Electronically Signed   By: Karen Kays M.D.   On: 04/22/2023 15:37   ECHOCARDIOGRAM COMPLETE  Result Date: 04/18/2023    ECHOCARDIOGRAM REPORT   Patient Name:   MALEAK MCNEVIN Date of Exam: 04/18/2023 Medical Rec #:  [id number]            Height:       66.0 in Accession #:    [id number]           Weight:       209.7 lb Date of Birth:  04-Jan-1941            BSA:          2.040 m Patient Age:    81 years             BP:           128/58 mmHg Patient Gender: M                    HR:           75 bpm. Exam Location:  [facility] Procedure: 2D Echo, Cardiac Doppler and Color Doppler Indications:     Aortic Stenosis  History:         Patient has prior history of Echocardiogram examinations, most  recent 08/15/2022. CHF, CAD, Acute MI and  Angina, PAD, TIA and                  COPD, Aortic Valve Disease, Signs/Symptoms:Chest Pain and                  Shortness of Breath; Risk Factors:Hypertension, Sleep Apnea and                  Dyslipidemia. PVD.  Sonographer:     Mikki Harbor Referring Phys:  [id number] LILY MICHELLE TANG Diagnosing Phys: Marcina Millard MD  Sonographer Comments: Technically difficult study due to poor echo windows and suboptimal subcostal window. Image acquisition challenging due to COPD. IMPRESSIONS  1. Left ventricular ejection fraction, by estimation, is 55 to 60%. The left ventricle has normal function. The left ventricle has no regional wall motion abnormalities. Left ventricular diastolic parameters are consistent with Grade I diastolic dysfunction (impaired relaxation).  2. Right ventricular systolic function is normal. The right ventricular size is normal. There is normal pulmonary artery systolic pressure.  3. The mitral valve is normal in structure. Mild to moderate mitral valve regurgitation. No evidence of mitral stenosis.  4. The aortic valve is normal in structure. Aortic valve regurgitation is not visualized. Moderate aortic valve stenosis.  5. The inferior vena cava is normal in size with greater than 50% respiratory variability, suggesting right atrial pressure of 3 mmHg. FINDINGS  Left Ventricle: Left ventricular ejection fraction, by estimation, is 55 to 60%. The left ventricle has normal function. The left ventricle has no regional wall motion abnormalities. The left ventricular internal cavity size was normal in size. There is  no left ventricular hypertrophy. Left ventricular diastolic parameters are consistent with Grade I diastolic dysfunction (impaired relaxation). Right Ventricle: The right ventricular size is normal. No increase in right ventricular wall thickness. Right ventricular systolic function is normal. There is normal pulmonary artery systolic pressure. The tricuspid regurgitant velocity is  2.25 m/s, and  with an assumed right atrial pressure of 8 mmHg, the estimated right ventricular systolic pressure is 28.2 mmHg. Left Atrium: Left atrial size was normal in size. Right Atrium: Right atrial size was normal in size. Pericardium: There is no evidence of pericardial effusion. Mitral Valve: The mitral valve is normal in structure. Mild to moderate mitral valve regurgitation. No evidence of mitral valve stenosis. MV peak gradient, 6.4 mmHg. The mean mitral valve gradient is 3.0 mmHg. Tricuspid Valve: The tricuspid valve is normal in structure. Tricuspid valve regurgitation is not demonstrated. No evidence of tricuspid stenosis. Aortic Valve: The aortic valve is normal in structure. Aortic valve regurgitation is not visualized. Moderate aortic stenosis is present. Aortic valve mean gradient measures 22.0 mmHg. Aortic valve peak gradient measures 39.4 mmHg. Aortic valve area, by VTI measures 1.27 cm. Pulmonic Valve: The pulmonic valve was normal in structure. Pulmonic valve regurgitation is not visualized. No evidence of pulmonic stenosis. Aorta: The aortic root is normal in size and structure. Venous: The inferior vena cava is normal in size with greater than 50% respiratory variability, suggesting right atrial pressure of 3 mmHg. IAS/Shunts: No atrial level shunt detected by color flow Doppler.  LEFT VENTRICLE PLAX 2D LVIDd:         3.80 cm   Diastology LVIDs:         2.70 cm   LV e' medial:    5.55 cm/s LV PW:         1.30 cm   LV E/e'  medial:  16.6 LV IVS:        1.20 cm   LV e' lateral:   8.49 cm/s LVOT diam:     2.10 cm   LV E/e' lateral: 10.9 LV SV:         92 LV SV Index:   45 LVOT Area:     3.46 cm  RIGHT VENTRICLE RV Basal diam:  3.80 cm RV Mid diam:    3.10 cm RV S prime:     16.80 cm/s TAPSE (M-mode): 2.6 cm LEFT ATRIUM             Index        RIGHT ATRIUM           Index LA diam:        4.50 cm 2.21 cm/m   RA Area:     12.20 cm LA Vol (A2C):   80.7 ml 39.55 ml/m  RA Volume:   24.90 ml   12.20 ml/m LA Vol (A4C):   60.5 ml 29.65 ml/m LA Biplane Vol: 72.6 ml 35.58 ml/m  AORTIC VALVE                     PULMONIC VALVE AV Area (Vmax):    1.25 cm      PV Vmax:       1.54 m/s AV Area (Vmean):   1.24 cm      PV Peak grad:  9.5 mmHg AV Area (VTI):     1.27 cm AV Vmax:           314.00 cm/s AV Vmean:          216.667 cm/s AV VTI:            0.726 m AV Peak Grad:      39.4 mmHg AV Mean Grad:      22.0 mmHg LVOT Vmax:         113.00 cm/s LVOT Vmean:        77.300 cm/s LVOT VTI:          0.267 m LVOT/AV VTI ratio: 0.37  AORTA Ao Root diam: 3.70 cm Ao Asc diam:  3.30 cm MITRAL VALVE                TRICUSPID VALVE MV Area (PHT): 3.15 cm     TR Peak grad:   20.2 mmHg MV Area VTI:   2.39 cm     TR Vmax:        225.00 cm/s MV Peak grad:  6.4 mmHg MV Mean grad:  3.0 mmHg     SHUNTS MV Vmax:       1.26 m/s     Systemic VTI:  0.27 m MV Vmean:      76.0 cm/s    Systemic Diam: 2.10 cm MV Decel Time: 241 msec MV E velocity: 92.20 cm/s MV A velocity: 121.00 cm/s MV E/A ratio:  0.76 Marcina Millard MD Electronically signed by Marcina Millard MD Signature Date/Time: 04/18/2023/5:22:03 PM    Final    DG Chest Portable 1 View  Result Date: 04/18/2023 CLINICAL DATA:  Chest pain EXAM: PORTABLE CHEST 1 VIEW COMPARISON:  12/12/2022 FINDINGS: Borderline heart size that is stable. Mild interstitial coarsening without Kerley line or air bronchogram. No effusion or pneumothorax. Artifact from EKG leads. IMPRESSION: No edema or focal pneumonia. Electronically Signed   By: Tiburcio Pea M.D.   On: 04/18/2023 05:15    Microbiology: Results for orders placed or  performed during the hospital encounter of 04/18/23  Culture, blood (Routine X 2) w Reflex to ID Panel     Status: None   Collection Time: 04/18/23  9:42 AM   Specimen: BLOOD  Result Value Ref Range Status   Specimen Description BLOOD BLOOD LEFT HAND  Final   Special Requests   Final    BOTTLES DRAWN AEROBIC AND ANAEROBIC Blood Culture adequate volume    Culture   Final    NO GROWTH 5 DAYS Performed at [facility], [address] [place], [place] [address]    Report Status 04/23/2023 FINAL  Final  Culture, blood (Routine X 2) w Reflex to ID Panel     Status: None   Collection Time: 04/18/23  9:42 AM   Specimen: BLOOD  Result Value Ref Range Status   Specimen Description BLOOD LEFT ANTECUBITAL  Final   Special Requests   Final    BOTTLES DRAWN AEROBIC AND ANAEROBIC Blood Culture adequate volume   Culture   Final    NO GROWTH 5 DAYS Performed at [facility], [address] [place], [place] [address]    Report Status 04/23/2023 FINAL  Final    Labs: CBC: Recent Labs  Lab 04/18/23 0449 04/19/23 0134 04/20/23 0344 04/21/23 0404 04/22/23 0434 04/23/23 0415 04/24/23 0436  WBC 18.2*   < > 10.9* 10.7* 14.0* 15.6* 15.6*  NEUTROABS 15.5*  --   --   --   --   --   --   HGB 10.2*   < > 10.7* 11.0* 11.3* 11.4* 10.9*  HCT 31.9*   < > 32.7* 32.9* 34.1* 34.7* 33.2*  MCV 98.8   < > 96.7 96.5 95.8 96.1 96.8  PLT 229   < > 266 300 296 317 311   < > = values in this interval not displayed.   Basic Metabolic Panel: Recent Labs  Lab 04/20/23 0344 04/21/23 0404 04/22/23 0434 04/23/23 0415 04/24/23 0436  NA 133* 133* 131* 134* 134*  K 3.8 3.6 3.7 4.0 4.2  CL 95* 95* 94* 101 103  CO2 24 24 25 22 23   GLUCOSE 173* 231* 160* 181* 179*  BUN 73* 74* 69* 64* 60*  CREATININE 3.09* 3.39* 3.02* 2.57* 2.43*  CALCIUM 8.9 8.8* 8.7* 8.7* 8.7*  MG 2.6* 2.5* 2.5* 2.4 2.2   Liver Function Tests: Recent Labs  Lab 04/18/23 0449 04/19/23 0134  AST 19 39  ALT 13 18  ALKPHOS 49 51  BILITOT 1.0 1.0  PROT 6.2* 6.8  ALBUMIN 3.5 3.8   CBG: Recent Labs  Lab 04/23/23 0729 04/23/23 1206 04/23/23 1551 04/23/23 2105 04/24/23 0829  GLUCAP 197* 244* 188* 212* 222*    Discharge time spent: greater than 30 minutes.  Signed: Marrion Coy, MD [facility] 04/24/2023

## 2023-04-24 NOTE — Progress Notes (Signed)
[facility]  PROGRESS NOTE   Subjective:   Cough has improved  Objective:  Vital signs: Blood pressure (!) 116/59, pulse 71, temperature 97.8 F (36.6 C), resp. rate 18, weight 86.6 kg, SpO2 99 %.  Intake/Output Summary (Last 24 hours) at 04/24/2023 1310 Last data filed at 04/24/2023 1052 Gross per 24 hour  Intake 1840.58 ml  Output 650 ml  Net 1190.58 ml    Filed Weights   04/18/23 0441 04/24/23 0550  Weight: 95.1 kg 86.6 kg     Physical Exam: General:  No acute distress  Head:  Normocephalic, atraumatic. Moist oral mucosal membranes  Eyes:  Anicteric  Neck:  Supple  Lungs:   Clear to auscultation, normal effort  Heart:  regular  Abdomen:   Soft, nontender, bowel sounds present  Extremities:  No peripheral edema.  Neurologic:  Awake, alert, following commands  Skin:  No lesions   :     Basic Metabolic Panel: Recent Labs  Lab 04/20/23 0344 04/21/23 0404 04/22/23 0434 04/23/23 0415 04/24/23 0436  NA 133* 133* 131* 134* 134*  K 3.8 3.6 3.7 4.0 4.2  CL 95* 95* 94* 101 103  CO2 24 24 25 22 23   GLUCOSE 173* 231* 160* 181* 179*  BUN 73* 74* 69* 64* 60*  CREATININE 3.09* 3.39* 3.02* 2.57* 2.43*  CALCIUM 8.9 8.8* 8.7* 8.7* 8.7*  MG 2.6* 2.5* 2.5* 2.4 2.2    GFR: Estimated Creatinine Clearance: 24.2 mL/min (A) (by C-G formula based on SCr of 2.43 mg/dL (H)).  Liver Function Tests: Recent Labs  Lab 04/18/23 0449 04/19/23 0134  AST 19 39  ALT 13 18  ALKPHOS 49 51  BILITOT 1.0 1.0  PROT 6.2* 6.8  ALBUMIN 3.5 3.8    No results for input(s): "LIPASE", "AMYLASE" in the last 168 hours. No results for input(s): "AMMONIA" in the last 168 hours.  CBC: Recent Labs  Lab 04/18/23 0449 04/19/23 0134 04/20/23 0344 04/21/23 0404 04/22/23 0434 04/23/23 0415 04/24/23 0436  WBC 18.2*   < > 10.9* 10.7* 14.0* 15.6* 15.6*  NEUTROABS 15.5*  --   --   --   --   --   --   HGB 10.2*   < > 10.7* 11.0* 11.3* 11.4* 10.9*  HCT 31.9*   < > 32.7* 32.9*  34.1* 34.7* 33.2*  MCV 98.8   < > 96.7 96.5 95.8 96.1 96.8  PLT 229   < > 266 300 296 317 311   < > = values in this interval not displayed.      HbA1C: Hemoglobin A1C  Date/Time Value Ref Range Status  07/18/2021 12:00 AM 8.3  Final   Hgb A1c MFr Bld  Date/Time Value Ref Range Status  04/19/2023 01:34 AM 6.2 (H) 4.8 - 5.6 % Final    Comment:    (NOTE) Pre diabetes:          5.7%-6.4%  Diabetes:              >6.4%  Glycemic control for   <7.0% adults with diabetes   08/15/2022 09:56 AM 7.5 (H) 4.8 - 5.6 % Final    Comment:    (NOTE) Pre diabetes:          5.7%-6.4%  Diabetes:              >6.4%  Glycemic control for   <7.0% adults with diabetes     Urinalysis: No results for input(s): "COLORURINE", "LABSPEC", "PHURINE", "GLUCOSEU", "HGBUR", "BILIRUBINUR", "KETONESUR", "PROTEINUR", "  UROBILINOGEN", "NITRITE", "LEUKOCYTESUR" in the last 72 hours.  Invalid input(s): "APPERANCEUR"    Imaging: No results found.   Medications:    doxycycline (VIBRAMYCIN) IV 100 mg (04/24/23 0553)    allopurinol  100 mg Oral Daily   arformoterol  15 mcg Nebulization BID   And   umeclidinium bromide  1 puff Inhalation Daily   atorvastatin  80 mg Oral Daily   citalopram  10 mg Oral Daily   clopidogrel  75 mg Oral Daily   diltiazem  120 mg Oral Daily   furosemide  40 mg Oral Daily   heparin injection (subcutaneous)  5,000 Units Subcutaneous Q8H   insulin aspart  0-5 Units Subcutaneous QHS   insulin aspart  0-9 Units Subcutaneous TID WC   insulin aspart  11 Units Subcutaneous TID WC   insulin glargine-yfgn  27 Units Subcutaneous QHS   isosorbide mononitrate  60 mg Oral Daily   methocarbamol  500 mg Oral TID   metoprolol tartrate  12.5 mg Oral BID   polyethylene glycol  34 g Oral BID   ranolazine  500 mg Oral Daily   sodium bicarbonate  650 mg Oral BID    Assessment/ Plan:     83 y.o.  male with past medical conditions including type 2 diabetes, CVA, chronic diastolic  heart failure, CAD, COPD, and chronic kidney disease stage IV.  Patient presents to the emergency department complaining of chest pain that radiates to his jaw and left arm.  Patient has been admitted for Other chest pain [R07.89] NSTEMI (non-ST elevated myocardial infarction) (HCC) [I21.4]   #1: Acute kidney injury on chronic kidney disease stage IV: renal function continues to improve   #2: Anemia of chronic kidney disease: Presently hemoglobin stable.    #3: Diabetes mellitus type II with chronic kidney disease: continue glucose control.    #4: Metabolic acidosis: chronic. Will continue the sodium bicarbonate at 650 mg twice a day.   #5: Hypertension/coronary artery disease/congestive heart failure.: - fluid restriction - isosorbide mononitrate, metoprolol, furosemide and started this hospitalization: diltiazem.  Appreciate cardiology input.   Needs close follow up with Nephrology.    LOS: 6 Lamont Dowdy, MD [facility] 5/15/20241:10 PM

## 2023-04-24 NOTE — Progress Notes (Signed)
Discussed discharge instructions with patient and family in room, including follow up appointments and medications.   AVS sent home with patient.  No further questions

## 2023-04-24 NOTE — Progress Notes (Signed)
Mobility Specialist - Progress Note   04/24/23 0832  Therapy Vitals  Temp 99.2 F (37.3 C)  Pulse Rate 91  Resp 18  BP (!) 87/74  Patient Position (if appropriate) Sitting  Oxygen Therapy  SpO2 99 %  O2 Device Room Air  Mobility  Activity Ambulated independently in hallway;Stood at bedside;Dangled on edge of bed  Level of Designer, industrial/product wheel walker  Distance Ambulated (ft) 260 ft  Activity Response Tolerated well  Mobility Referral Yes  $Mobility charge 1 Mobility  Mobility Specialist Start Time (ACUTE ONLY) 0830  Mobility Specialist Stop Time (ACUTE ONLY) 0849  Mobility Specialist Time Calculation (min) (ACUTE ONLY) 19 min   Pt sitting EOB on RA upon arrival. Pt STS and ambulates in hallway indep with no LOB noted. Pt left in recliner with needs in reach.   Terrilyn Saver  Mobility Specialist  04/24/23 8:51 AM

## 2023-05-03 DIAGNOSIS — I214 Non-ST elevation (NSTEMI) myocardial infarction: Secondary | ICD-10-CM | POA: Diagnosis not present

## 2023-05-03 DIAGNOSIS — I480 Paroxysmal atrial fibrillation: Secondary | ICD-10-CM | POA: Diagnosis not present

## 2023-05-03 DIAGNOSIS — I739 Peripheral vascular disease, unspecified: Secondary | ICD-10-CM | POA: Diagnosis not present

## 2023-05-03 DIAGNOSIS — R002 Palpitations: Secondary | ICD-10-CM | POA: Diagnosis not present

## 2023-05-03 DIAGNOSIS — Z955 Presence of coronary angioplasty implant and graft: Secondary | ICD-10-CM | POA: Diagnosis not present

## 2023-05-03 DIAGNOSIS — J449 Chronic obstructive pulmonary disease, unspecified: Secondary | ICD-10-CM | POA: Diagnosis not present

## 2023-05-03 DIAGNOSIS — I509 Heart failure, unspecified: Secondary | ICD-10-CM | POA: Diagnosis not present

## 2023-05-03 DIAGNOSIS — I2510 Atherosclerotic heart disease of native coronary artery without angina pectoris: Secondary | ICD-10-CM | POA: Diagnosis not present

## 2023-05-03 DIAGNOSIS — N184 Chronic kidney disease, stage 4 (severe): Secondary | ICD-10-CM | POA: Diagnosis not present

## 2023-05-03 DIAGNOSIS — Z8673 Personal history of transient ischemic attack (TIA), and cerebral infarction without residual deficits: Secondary | ICD-10-CM | POA: Diagnosis not present

## 2023-05-03 DIAGNOSIS — R0602 Shortness of breath: Secondary | ICD-10-CM | POA: Diagnosis not present

## 2023-05-03 DIAGNOSIS — I10 Essential (primary) hypertension: Secondary | ICD-10-CM | POA: Diagnosis not present

## 2023-05-07 DIAGNOSIS — I480 Paroxysmal atrial fibrillation: Secondary | ICD-10-CM | POA: Diagnosis not present

## 2023-05-13 DIAGNOSIS — I5033 Acute on chronic diastolic (congestive) heart failure: Secondary | ICD-10-CM | POA: Diagnosis not present

## 2023-05-13 DIAGNOSIS — I1310 Hypertensive heart and chronic kidney disease without heart failure, with stage 1 through stage 4 chronic kidney disease, or unspecified chronic kidney disease: Secondary | ICD-10-CM | POA: Diagnosis not present

## 2023-05-13 DIAGNOSIS — N2581 Secondary hyperparathyroidism of renal origin: Secondary | ICD-10-CM | POA: Diagnosis not present

## 2023-05-13 DIAGNOSIS — E119 Type 2 diabetes mellitus without complications: Secondary | ICD-10-CM | POA: Diagnosis not present

## 2023-05-13 DIAGNOSIS — N189 Chronic kidney disease, unspecified: Secondary | ICD-10-CM | POA: Diagnosis not present

## 2023-05-13 DIAGNOSIS — K2270 Barrett's esophagus without dysplasia: Secondary | ICD-10-CM | POA: Diagnosis not present

## 2023-05-13 DIAGNOSIS — I700 Atherosclerosis of aorta: Secondary | ICD-10-CM | POA: Diagnosis not present

## 2023-05-13 DIAGNOSIS — N179 Acute kidney failure, unspecified: Secondary | ICD-10-CM | POA: Diagnosis not present

## 2023-06-05 DIAGNOSIS — I129 Hypertensive chronic kidney disease with stage 1 through stage 4 chronic kidney disease, or unspecified chronic kidney disease: Secondary | ICD-10-CM | POA: Diagnosis not present

## 2023-06-05 DIAGNOSIS — N184 Chronic kidney disease, stage 4 (severe): Secondary | ICD-10-CM | POA: Diagnosis not present

## 2023-06-05 DIAGNOSIS — E1122 Type 2 diabetes mellitus with diabetic chronic kidney disease: Secondary | ICD-10-CM | POA: Diagnosis not present

## 2023-06-05 DIAGNOSIS — E875 Hyperkalemia: Secondary | ICD-10-CM | POA: Diagnosis not present

## 2023-06-05 DIAGNOSIS — E8722 Chronic metabolic acidosis: Secondary | ICD-10-CM | POA: Diagnosis not present

## 2023-06-05 DIAGNOSIS — N2581 Secondary hyperparathyroidism of renal origin: Secondary | ICD-10-CM | POA: Diagnosis not present

## 2023-06-05 DIAGNOSIS — I10 Essential (primary) hypertension: Secondary | ICD-10-CM | POA: Diagnosis not present

## 2023-06-05 DIAGNOSIS — D631 Anemia in chronic kidney disease: Secondary | ICD-10-CM | POA: Diagnosis not present

## 2023-06-05 DIAGNOSIS — E871 Hypo-osmolality and hyponatremia: Secondary | ICD-10-CM | POA: Diagnosis not present

## 2023-06-10 ENCOUNTER — Encounter: Payer: Self-pay | Admitting: Oncology

## 2023-06-10 LAB — PULMONARY FUNCTION TEST

## 2023-06-12 DIAGNOSIS — D631 Anemia in chronic kidney disease: Secondary | ICD-10-CM | POA: Diagnosis not present

## 2023-06-12 DIAGNOSIS — E871 Hypo-osmolality and hyponatremia: Secondary | ICD-10-CM | POA: Diagnosis not present

## 2023-06-12 DIAGNOSIS — E8722 Chronic metabolic acidosis: Secondary | ICD-10-CM | POA: Diagnosis not present

## 2023-06-12 DIAGNOSIS — E875 Hyperkalemia: Secondary | ICD-10-CM | POA: Diagnosis not present

## 2023-06-12 DIAGNOSIS — N184 Chronic kidney disease, stage 4 (severe): Secondary | ICD-10-CM | POA: Diagnosis not present

## 2023-06-12 DIAGNOSIS — I129 Hypertensive chronic kidney disease with stage 1 through stage 4 chronic kidney disease, or unspecified chronic kidney disease: Secondary | ICD-10-CM | POA: Diagnosis not present

## 2023-06-12 DIAGNOSIS — N2581 Secondary hyperparathyroidism of renal origin: Secondary | ICD-10-CM | POA: Diagnosis not present

## 2023-06-12 DIAGNOSIS — E1122 Type 2 diabetes mellitus with diabetic chronic kidney disease: Secondary | ICD-10-CM | POA: Diagnosis not present

## 2023-06-20 DIAGNOSIS — E785 Hyperlipidemia, unspecified: Secondary | ICD-10-CM | POA: Diagnosis not present

## 2023-06-20 DIAGNOSIS — I517 Cardiomegaly: Secondary | ICD-10-CM | POA: Diagnosis not present

## 2023-06-20 DIAGNOSIS — I200 Unstable angina: Secondary | ICD-10-CM | POA: Diagnosis not present

## 2023-06-20 DIAGNOSIS — Z79899 Other long term (current) drug therapy: Secondary | ICD-10-CM | POA: Diagnosis not present

## 2023-06-20 DIAGNOSIS — I739 Peripheral vascular disease, unspecified: Secondary | ICD-10-CM | POA: Diagnosis not present

## 2023-06-20 DIAGNOSIS — R079 Chest pain, unspecified: Secondary | ICD-10-CM | POA: Diagnosis not present

## 2023-06-20 DIAGNOSIS — I480 Paroxysmal atrial fibrillation: Secondary | ICD-10-CM | POA: Diagnosis not present

## 2023-06-20 DIAGNOSIS — I214 Non-ST elevation (NSTEMI) myocardial infarction: Secondary | ICD-10-CM | POA: Diagnosis not present

## 2023-06-20 DIAGNOSIS — I25110 Atherosclerotic heart disease of native coronary artery with unstable angina pectoris: Secondary | ICD-10-CM | POA: Diagnosis not present

## 2023-06-20 DIAGNOSIS — I10 Essential (primary) hypertension: Secondary | ICD-10-CM | POA: Diagnosis not present

## 2023-06-20 DIAGNOSIS — I2510 Atherosclerotic heart disease of native coronary artery without angina pectoris: Secondary | ICD-10-CM | POA: Diagnosis not present

## 2023-06-20 DIAGNOSIS — R748 Abnormal levels of other serum enzymes: Secondary | ICD-10-CM | POA: Diagnosis not present

## 2023-06-20 DIAGNOSIS — Z87891 Personal history of nicotine dependence: Secondary | ICD-10-CM | POA: Diagnosis not present

## 2023-06-21 DIAGNOSIS — N400 Enlarged prostate without lower urinary tract symptoms: Secondary | ICD-10-CM | POA: Diagnosis not present

## 2023-06-21 DIAGNOSIS — N179 Acute kidney failure, unspecified: Secondary | ICD-10-CM | POA: Diagnosis not present

## 2023-06-21 DIAGNOSIS — Z87892 Personal history of anaphylaxis: Secondary | ICD-10-CM | POA: Diagnosis not present

## 2023-06-21 DIAGNOSIS — Z87442 Personal history of urinary calculi: Secondary | ICD-10-CM | POA: Diagnosis not present

## 2023-06-21 DIAGNOSIS — Z86010 Personal history of colonic polyps: Secondary | ICD-10-CM | POA: Diagnosis not present

## 2023-06-21 DIAGNOSIS — R079 Chest pain, unspecified: Secondary | ICD-10-CM | POA: Diagnosis not present

## 2023-06-21 DIAGNOSIS — I200 Unstable angina: Secondary | ICD-10-CM | POA: Diagnosis not present

## 2023-06-21 DIAGNOSIS — I214 Non-ST elevation (NSTEMI) myocardial infarction: Secondary | ICD-10-CM | POA: Diagnosis not present

## 2023-06-21 DIAGNOSIS — E785 Hyperlipidemia, unspecified: Secondary | ICD-10-CM | POA: Diagnosis not present

## 2023-06-21 DIAGNOSIS — R9431 Abnormal electrocardiogram [ECG] [EKG]: Secondary | ICD-10-CM | POA: Diagnosis not present

## 2023-06-21 DIAGNOSIS — E1122 Type 2 diabetes mellitus with diabetic chronic kidney disease: Secondary | ICD-10-CM | POA: Diagnosis not present

## 2023-06-21 DIAGNOSIS — J479 Bronchiectasis, uncomplicated: Secondary | ICD-10-CM | POA: Diagnosis not present

## 2023-06-21 DIAGNOSIS — J8410 Pulmonary fibrosis, unspecified: Secondary | ICD-10-CM | POA: Diagnosis not present

## 2023-06-21 DIAGNOSIS — G4733 Obstructive sleep apnea (adult) (pediatric): Secondary | ICD-10-CM | POA: Diagnosis not present

## 2023-06-21 DIAGNOSIS — N184 Chronic kidney disease, stage 4 (severe): Secondary | ICD-10-CM | POA: Diagnosis not present

## 2023-06-21 DIAGNOSIS — Z85820 Personal history of malignant melanoma of skin: Secondary | ICD-10-CM | POA: Diagnosis not present

## 2023-06-21 DIAGNOSIS — Z8501 Personal history of malignant neoplasm of esophagus: Secondary | ICD-10-CM | POA: Diagnosis not present

## 2023-06-21 DIAGNOSIS — Z79899 Other long term (current) drug therapy: Secondary | ICD-10-CM | POA: Diagnosis not present

## 2023-06-21 DIAGNOSIS — Z8546 Personal history of malignant neoplasm of prostate: Secondary | ICD-10-CM | POA: Diagnosis not present

## 2023-06-21 DIAGNOSIS — M109 Gout, unspecified: Secondary | ICD-10-CM | POA: Diagnosis not present

## 2023-06-21 DIAGNOSIS — I252 Old myocardial infarction: Secondary | ICD-10-CM | POA: Diagnosis not present

## 2023-06-21 DIAGNOSIS — Z7902 Long term (current) use of antithrombotics/antiplatelets: Secondary | ICD-10-CM | POA: Diagnosis not present

## 2023-06-21 DIAGNOSIS — I480 Paroxysmal atrial fibrillation: Secondary | ICD-10-CM | POA: Diagnosis not present

## 2023-06-21 DIAGNOSIS — I5032 Chronic diastolic (congestive) heart failure: Secondary | ICD-10-CM | POA: Diagnosis not present

## 2023-06-21 DIAGNOSIS — E1151 Type 2 diabetes mellitus with diabetic peripheral angiopathy without gangrene: Secondary | ICD-10-CM | POA: Diagnosis not present

## 2023-06-21 DIAGNOSIS — I5A Non-ischemic myocardial injury (non-traumatic): Secondary | ICD-10-CM | POA: Diagnosis not present

## 2023-06-21 DIAGNOSIS — J449 Chronic obstructive pulmonary disease, unspecified: Secondary | ICD-10-CM | POA: Diagnosis not present

## 2023-06-21 DIAGNOSIS — I130 Hypertensive heart and chronic kidney disease with heart failure and stage 1 through stage 4 chronic kidney disease, or unspecified chronic kidney disease: Secondary | ICD-10-CM | POA: Diagnosis not present

## 2023-06-21 DIAGNOSIS — Z8673 Personal history of transient ischemic attack (TIA), and cerebral infarction without residual deficits: Secondary | ICD-10-CM | POA: Diagnosis not present

## 2023-06-21 DIAGNOSIS — I951 Orthostatic hypotension: Secondary | ICD-10-CM | POA: Diagnosis not present

## 2023-06-21 DIAGNOSIS — I080 Rheumatic disorders of both mitral and aortic valves: Secondary | ICD-10-CM | POA: Diagnosis not present

## 2023-06-21 DIAGNOSIS — I2510 Atherosclerotic heart disease of native coronary artery without angina pectoris: Secondary | ICD-10-CM | POA: Diagnosis not present

## 2023-06-21 DIAGNOSIS — I5033 Acute on chronic diastolic (congestive) heart failure: Secondary | ICD-10-CM | POA: Diagnosis not present

## 2023-06-21 DIAGNOSIS — I739 Peripheral vascular disease, unspecified: Secondary | ICD-10-CM | POA: Diagnosis not present

## 2023-06-25 ENCOUNTER — Other Ambulatory Visit: Payer: Medicare Other

## 2023-06-27 ENCOUNTER — Ambulatory Visit (INDEPENDENT_AMBULATORY_CARE_PROVIDER_SITE_OTHER): Payer: No Typology Code available for payment source | Admitting: Nurse Practitioner

## 2023-06-27 ENCOUNTER — Encounter (INDEPENDENT_AMBULATORY_CARE_PROVIDER_SITE_OTHER): Payer: Medicare Other

## 2023-06-27 ENCOUNTER — Encounter (INDEPENDENT_AMBULATORY_CARE_PROVIDER_SITE_OTHER): Payer: No Typology Code available for payment source

## 2023-06-28 DIAGNOSIS — I5033 Acute on chronic diastolic (congestive) heart failure: Secondary | ICD-10-CM | POA: Diagnosis not present

## 2023-06-28 DIAGNOSIS — I5023 Acute on chronic systolic (congestive) heart failure: Secondary | ICD-10-CM | POA: Diagnosis not present

## 2023-07-02 ENCOUNTER — Ambulatory Visit: Payer: Medicare Other

## 2023-07-02 ENCOUNTER — Ambulatory Visit: Payer: Medicare Other | Admitting: Oncology

## 2023-07-04 DIAGNOSIS — E1159 Type 2 diabetes mellitus with other circulatory complications: Secondary | ICD-10-CM | POA: Diagnosis not present

## 2023-07-05 DIAGNOSIS — I214 Non-ST elevation (NSTEMI) myocardial infarction: Secondary | ICD-10-CM | POA: Diagnosis not present

## 2023-07-05 DIAGNOSIS — I2089 Other forms of angina pectoris: Secondary | ICD-10-CM | POA: Diagnosis not present

## 2023-07-05 DIAGNOSIS — R0602 Shortness of breath: Secondary | ICD-10-CM | POA: Diagnosis not present

## 2023-07-05 DIAGNOSIS — I5023 Acute on chronic systolic (congestive) heart failure: Secondary | ICD-10-CM | POA: Diagnosis not present

## 2023-07-05 DIAGNOSIS — Z8673 Personal history of transient ischemic attack (TIA), and cerebral infarction without residual deficits: Secondary | ICD-10-CM | POA: Diagnosis not present

## 2023-07-05 DIAGNOSIS — N184 Chronic kidney disease, stage 4 (severe): Secondary | ICD-10-CM | POA: Diagnosis not present

## 2023-07-05 DIAGNOSIS — I480 Paroxysmal atrial fibrillation: Secondary | ICD-10-CM | POA: Diagnosis not present

## 2023-07-05 DIAGNOSIS — R079 Chest pain, unspecified: Secondary | ICD-10-CM | POA: Diagnosis not present

## 2023-07-05 DIAGNOSIS — I739 Peripheral vascular disease, unspecified: Secondary | ICD-10-CM | POA: Diagnosis not present

## 2023-07-05 DIAGNOSIS — J42 Unspecified chronic bronchitis: Secondary | ICD-10-CM | POA: Diagnosis not present

## 2023-07-05 DIAGNOSIS — I5033 Acute on chronic diastolic (congestive) heart failure: Secondary | ICD-10-CM | POA: Diagnosis not present

## 2023-07-05 DIAGNOSIS — Z955 Presence of coronary angioplasty implant and graft: Secondary | ICD-10-CM | POA: Diagnosis not present

## 2023-07-07 ENCOUNTER — Encounter: Payer: Self-pay | Admitting: Oncology

## 2023-07-10 ENCOUNTER — Inpatient Hospital Stay: Payer: Medicare Other | Attending: Oncology

## 2023-07-10 DIAGNOSIS — N184 Chronic kidney disease, stage 4 (severe): Secondary | ICD-10-CM | POA: Insufficient documentation

## 2023-07-10 DIAGNOSIS — D631 Anemia in chronic kidney disease: Secondary | ICD-10-CM | POA: Diagnosis not present

## 2023-07-10 DIAGNOSIS — D500 Iron deficiency anemia secondary to blood loss (chronic): Secondary | ICD-10-CM

## 2023-07-10 LAB — IRON AND TIBC
Iron: 62 ug/dL (ref 45–182)
Saturation Ratios: 19 % (ref 17.9–39.5)
TIBC: 329 ug/dL (ref 250–450)
UIBC: 267 ug/dL

## 2023-07-10 LAB — RETIC PANEL
Immature Retic Fract: 11.6 % (ref 2.3–15.9)
RBC.: 4.04 MIL/uL — ABNORMAL LOW (ref 4.22–5.81)
Retic Count, Absolute: 101 10*3/uL (ref 19.0–186.0)
Retic Ct Pct: 2.5 % (ref 0.4–3.1)
Reticulocyte Hemoglobin: 33.5 pg (ref >27.9)

## 2023-07-10 LAB — CBC WITH DIFFERENTIAL/PLATELET
Abs Immature Granulocytes: 0.14 10*3/uL — ABNORMAL HIGH (ref 0.00–0.07)
Basophils Absolute: 0.1 10*3/uL (ref 0.0–0.1)
Basophils Relative: 1 %
Eosinophils Absolute: 0.5 10*3/uL (ref 0.0–0.5)
Eosinophils Relative: 5 %
HCT: 37.1 % — ABNORMAL LOW (ref 39.0–52.0)
Hemoglobin: 12.5 g/dL — ABNORMAL LOW (ref 13.0–17.0)
Immature Granulocytes: 1 %
Lymphocytes Relative: 8 %
Lymphs Abs: 0.9 10*3/uL (ref 0.7–4.0)
MCH: 31.1 pg (ref 26.0–34.0)
MCHC: 33.7 g/dL (ref 30.0–36.0)
MCV: 92.3 fL (ref 80.0–100.0)
Monocytes Absolute: 0.8 10*3/uL (ref 0.1–1.0)
Monocytes Relative: 8 %
Neutro Abs: 7.8 10*3/uL — ABNORMAL HIGH (ref 1.7–7.7)
Neutrophils Relative %: 77 %
Platelets: 240 10*3/uL (ref 150–400)
RBC: 4.02 MIL/uL — ABNORMAL LOW (ref 4.22–5.81)
RDW: 14.3 % (ref 11.5–15.5)
WBC: 10.2 10*3/uL (ref 4.0–10.5)
nRBC: 0 % (ref 0.0–0.2)

## 2023-07-10 LAB — FERRITIN: Ferritin: 87 ng/mL (ref 24–336)

## 2023-07-10 LAB — VITAMIN B12: Vitamin B-12: 815 pg/mL (ref 180–914)

## 2023-07-11 DIAGNOSIS — Z794 Long term (current) use of insulin: Secondary | ICD-10-CM | POA: Diagnosis not present

## 2023-07-11 DIAGNOSIS — E1122 Type 2 diabetes mellitus with diabetic chronic kidney disease: Secondary | ICD-10-CM | POA: Diagnosis not present

## 2023-07-11 DIAGNOSIS — E1165 Type 2 diabetes mellitus with hyperglycemia: Secondary | ICD-10-CM | POA: Diagnosis not present

## 2023-07-11 DIAGNOSIS — E1159 Type 2 diabetes mellitus with other circulatory complications: Secondary | ICD-10-CM | POA: Diagnosis not present

## 2023-07-11 DIAGNOSIS — E1129 Type 2 diabetes mellitus with other diabetic kidney complication: Secondary | ICD-10-CM | POA: Diagnosis not present

## 2023-07-11 DIAGNOSIS — I5033 Acute on chronic diastolic (congestive) heart failure: Secondary | ICD-10-CM | POA: Diagnosis not present

## 2023-07-11 DIAGNOSIS — Z9289 Personal history of other medical treatment: Secondary | ICD-10-CM | POA: Diagnosis not present

## 2023-07-11 DIAGNOSIS — E1142 Type 2 diabetes mellitus with diabetic polyneuropathy: Secondary | ICD-10-CM | POA: Diagnosis not present

## 2023-07-11 DIAGNOSIS — N184 Chronic kidney disease, stage 4 (severe): Secondary | ICD-10-CM | POA: Diagnosis not present

## 2023-07-11 DIAGNOSIS — R809 Proteinuria, unspecified: Secondary | ICD-10-CM | POA: Diagnosis not present

## 2023-07-11 DIAGNOSIS — I2510 Atherosclerotic heart disease of native coronary artery without angina pectoris: Secondary | ICD-10-CM | POA: Diagnosis not present

## 2023-07-15 ENCOUNTER — Telehealth: Payer: Self-pay | Admitting: Internal Medicine

## 2023-07-15 ENCOUNTER — Encounter: Payer: Self-pay | Admitting: Oncology

## 2023-07-15 ENCOUNTER — Ambulatory Visit (INDEPENDENT_AMBULATORY_CARE_PROVIDER_SITE_OTHER): Payer: Medicare Other | Admitting: Internal Medicine

## 2023-07-15 ENCOUNTER — Encounter: Payer: Self-pay | Admitting: Internal Medicine

## 2023-07-15 VITALS — BP 119/60 | HR 68 | Temp 97.8°F | Resp 16 | Ht 66.0 in | Wt 192.0 lb

## 2023-07-15 DIAGNOSIS — I350 Nonrheumatic aortic (valve) stenosis: Secondary | ICD-10-CM | POA: Diagnosis not present

## 2023-07-15 DIAGNOSIS — K219 Gastro-esophageal reflux disease without esophagitis: Secondary | ICD-10-CM

## 2023-07-15 DIAGNOSIS — G4733 Obstructive sleep apnea (adult) (pediatric): Secondary | ICD-10-CM | POA: Diagnosis not present

## 2023-07-15 DIAGNOSIS — J4520 Mild intermittent asthma, uncomplicated: Secondary | ICD-10-CM

## 2023-07-15 DIAGNOSIS — R053 Chronic cough: Secondary | ICD-10-CM | POA: Diagnosis not present

## 2023-07-15 NOTE — Progress Notes (Signed)
[facility] [address] [place], [place] [address]  Pulmonary Sleep Medicine   Office Visit Note  Patient Name: Mike Decker DOB: Aug 05, 1941 MRN [id number]  Date of Service: 07/15/2023  Complaints/HPI: He was admitted to [facility] for CHF, Had hsi medications adjusted for the CHF. Patient has noted some sputum production along with the cough. Denies having chest tightness but some pain is noted. Has not taken any nitroglycerin for it. He states he was given flovent at [facility] had been changed from siolto and asmanex. A recent echo shows normal LV function with mild LVH along with moderate AS. He is supposed to see a structural heart disease. The AS may be contributing to the symptoms also. He does note hoarseness of his voice. He does have associated cough also with the symptoms ?GERD  Office Spirometry Results:     ROS  General: (-) fever, (-) chills, (-) night sweats, (-) weakness Skin: (-) rashes, (-) itching,. Eyes: (-) visual changes, (-) redness, (-) itching. Nose and Sinuses: (-) nasal stuffiness or itchiness, (-) postnasal drip, (-) nosebleeds, (-) sinus trouble. Mouth and Throat: (-) sore throat, (-) hoarseness. Neck: (-) swollen glands, (-) enlarged thyroid, (-) neck pain. Respiratory: + cough, (-) bloody sputum, + shortness of breath, - wheezing. Cardiovascular: - ankle swelling, (-) chest pain. Lymphatic: (-) lymph node enlargement. Neurologic: (-) numbness, (-) tingling. Psychiatric: (-) anxiety, (-) depression   Current Medication: Outpatient Encounter Medications as of 07/15/2023  Medication Sig Note   acetaminophen (TYLENOL) 500 MG tablet Take 1,000 mg by mouth every 8 (eight) hours as needed for mild pain.    allopurinol (ZYLOPRIM) 100 MG tablet Take 100 mg by mouth daily.    atorvastatin (LIPITOR) 80 MG tablet Take 1 tablet (80 mg total) by mouth daily.    azelastine (ASTELIN) 0.1 % nasal spray USE 1 TO 2 SPRAYS IN EACH NOSTRIL TWICE A DAY     calcitRIOL (ROCALTROL) 0.25 MCG capsule Take 0.25 mcg by mouth daily.    cetirizine (ZYRTEC) 5 MG tablet Take 5 mg by mouth daily.    citalopram (CELEXA) 10 MG tablet Take 1 tablet (10 mg total) by mouth daily.    clopidogrel (PLAVIX) 75 MG tablet TAKE 1 TABLET BY MOUTH EVERY DAY    colchicine 0.6 MG tablet Take 3 tablets (1.8 mg total) by mouth daily as needed (gout flares).    [employer] VITAMIN B12 1000 MCG tablet TAKE 1 TABLET BY MOUTH EVERY DAY 08/15/2022: Sunday, Tuesday and Thursday only   diltiazem (CARDIZEM CD) 120 MG 24 hr capsule Take 1 capsule (120 mg total) by mouth daily.    EPINEPHrine (EPI-PEN) 0.3 mg/0.3 mL SOAJ injection Inject into the muscle as needed (anaphylaxis).     ergocalciferol (VITAMIN D2) 1.25 MG (50000 UT) capsule Take 50,000 Units by mouth every 30 (thirty) days.    ferrous sulfate 325 (65 FE) MG EC tablet TAKE 1 TABLET BY MOUTH EVERY DAY    fluticasone (FLONASE) 50 MCG/ACT nasal spray USE 1 SPRAY IN EACH NOSTRIL EVERY DAY    folic acid (FOLVITE) 400 MCG tablet Take 400 mcg by mouth daily.    furosemide (LASIX) 20 MG tablet Take 2 tablets (40 mg total) by mouth daily.    hydrALAZINE (APRESOLINE) 50 MG tablet Take 50 mg by mouth 2 (two) times daily.    insulin aspart (NOVOLOG) 100 UNIT/ML FlexPen 10 UNITS IN AM, 30 UNITS AT LUNCH, 30 UNITS AT DINNER    insulin glargine (LANTUS) 100 UNIT/ML  injection Inject 0.35 mLs (35 Units total) into the skin at bedtime. This is a decrease from your previous 45 units nightly. (Patient taking differently: Inject 54 Units into the skin at bedtime.)    isosorbide mononitrate (IMDUR) 60 MG 24 hr tablet Take 1 tablet (60 mg total) by mouth daily.    JARDIANCE 10 MG TABS tablet Take 10 mg by mouth daily.    magnesium oxide (MAG-OX) 400 (240 Mg) MG tablet Take 1 tablet by mouth daily.    methocarbamol (ROBAXIN) 500 MG tablet Take 500 mg by mouth at bedtime.    metoprolol succinate (TOPROL-XL) 50 MG 24 hr tablet Take 1 tablet by mouth daily.     mometasone (ASMANEX) 220 MCG/ACT inhaler Inhale 2 puffs into the lungs daily.    montelukast (SINGULAIR) 10 MG tablet TAKE 1 TABLET BY MOUTH EVERYDAY AT BEDTIME    nitroGLYCERIN (NITROSTAT) 0.4 MG SL tablet Place 0.4 mg under the tongue every 5 (five) minutes x 3 doses as needed for chest pain.  04/18/2023: Pt took 3 tablets this morning    pantoprazole (PROTONIX) 40 MG tablet Take 1 tablet (40 mg total) by mouth 2 (two) times daily before a meal.    ranolazine (RANEXA) 500 MG 12 hr tablet Take 1 tablet (500 mg total) by mouth daily.    sodium bicarbonate 650 MG tablet Take 650 mg by mouth 2 (two) times daily.    sodium fluoride (DENTA 5000 PLUS) 1.1 % CREA dental cream See admin instructions.    Tiotropium Bromide-Olodaterol (STIOLTO RESPIMAT) 2.5-2.5 MCG/ACT AERS Inhale 1 each into the lungs daily.    [DISCONTINUED] hydrALAZINE (APRESOLINE) 25 MG tablet Take 25 mg by mouth 3 (three) times daily.    [DISCONTINUED] metoprolol tartrate (LOPRESSOR) 25 MG tablet Take 0.5 tablets (12.5 mg total) by mouth 2 (two) times daily.    No facility-administered encounter medications on file as of 07/15/2023.    Surgical History: Past Surgical History:  Procedure Laterality Date   CATARACT EXTRACTION  2011, 2012   COLONOSCOPY N/A 12/14/2018   Procedure: COLONOSCOPY;  Surgeon: Pasty Spillers, MD;  Location: [facility] ENDOSCOPY;  Service: Endoscopy;  Laterality: N/A;   CORONARY ANGIOPLASTY WITH STENT PLACEMENT  12/10/2010   CORONARY STENT INTERVENTION N/A 12/29/2019   Procedure: CORONARY STENT INTERVENTION;  Surgeon: Yvonne Kendall, MD;  Location: [facility] INVASIVE CV LAB;  Service: Cardiovascular;  Laterality: N/A;   ESOPHAGOGASTRODUODENOSCOPY N/A 12/14/2018   Procedure: ESOPHAGOGASTRODUODENOSCOPY (EGD);  Surgeon: Pasty Spillers, MD;  Location: [facility]OSCOPY;  Service: Endoscopy;  Laterality: N/A;   ESOPHAGOGASTRODUODENOSCOPY (EGD) WITH PROPOFOL N/A 06/01/2016   Procedure: ESOPHAGOGASTRODUODENOSCOPY  (EGD) WITH PROPOFOL;  Surgeon: Scot Jun, MD;  Location: [facility] ENDOSCOPY;  Service: Endoscopy;  Laterality: N/A;   ESOPHAGOGASTRODUODENOSCOPY (EGD) WITH PROPOFOL N/A 01/31/2023   Procedure: ESOPHAGOGASTRODUODENOSCOPY (EGD) WITH PROPOFOL;  Surgeon: Midge Minium, MD;  Location: [facility] ENDOSCOPY;  Service: Endoscopy;  Laterality: N/A;   FEMORAL ARTERY STENT Right    HERNIA REPAIR     x2   INTRAOCULAR LENS INSERTION     LEFT HEART CATH AND CORONARY ANGIOGRAPHY N/A 05/09/2017   Procedure: Left Heart Cath and Coronary Angiography;  Surgeon: Alwyn Pea, MD;  Location: [facility] INVASIVE CV LAB;  Service: Cardiovascular;  Laterality: N/A;   LEFT HEART CATH AND CORONARY ANGIOGRAPHY N/A 12/08/2018   Procedure: LEFT HEART CATH AND CORONARY ANGIOGRAPHY and possible PCI and stent;  Surgeon: Alwyn Pea, MD;  Location: [facility] INVASIVE CV LAB;  Service: Cardiovascular;  Laterality: N/A;  LEFT HEART CATH AND CORONARY ANGIOGRAPHY N/A 12/29/2019   Procedure: LEFT HEART CATH AND CORONARY ANGIOGRAPHY;  Surgeon: Yvonne Kendall, MD;  Location: [facility] INVASIVE CV LAB;  Service: Cardiovascular;  Laterality: N/A;   LEFT HEART CATH AND CORONARY ANGIOGRAPHY Left 12/13/2022   Procedure: LEFT HEART CATH AND CORONARY ANGIOGRAPHY;  Surgeon: Alwyn Pea, MD;  Location: [facility] INVASIVE CV LAB;  Service: Cardiovascular;  Laterality: Left;   NOSE SURGERY     submucous resection   PROSTATE SURGERY  12/10/2000   ROTATOR CUFF REPAIR Right     Medical History: 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, esphageal   Chronic diastolic CHF (congestive heart failure) (HCC)    Colon polyp    Diabetes mellitus without complication (HCC)    Diverticulosis    Gout    Heart disease    Hemangioma    liver   Hyperlipidemia    Hypertension    Myocardial infarct (HCC)     Ocular hypertension    Peripheral vascular disease (HCC)    Skin cancer    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HCC)    Sleep apnea    Vitreoretinal degeneration     Family History: Family History  Problem Relation Age of Onset   Arthritis Mother    Stroke Maternal Grandfather    Breast cancer Neg Hx     Social History: Social History   Socioeconomic History   Marital status: Married    Spouse name: Not on file   Number of children: 2   Years of education: Automotive engineer 3 years   Highest education level: Associate degree: occupational, Scientist, product/process development, or vocational program  Occupational History   Occupation: retired  Tobacco Use   Smoking status: Former    Current packs/day: 0.00    Types: Cigarettes    Start date: 12/10/1956    Quit date: 12/10/1976    Years since quitting: 46.6   Smokeless tobacco: Never  Vaping Use   Vaping status: Never Used  Substance and Sexual Activity   Alcohol use: No   Drug use: No   Sexual activity: Not Currently  Other Topics Concern   Not on file  Social History Narrative   Not on file   Social Determinants of Health   Financial Resource Strain: Low Risk  (06/21/2023)   Received from [facility]   Overall Financial Resource Strain (CARDIA)    Difficulty of Paying Living Expenses: Not very hard  Food Insecurity: No Food Insecurity (06/21/2023)   Received from [facility]   Hunger Vital Sign    Worried About Running Out of Food in the Last Year: Never true    Ran Out of Food in the Last Year: Never true  Transportation Needs: No Transportation Needs (06/21/2023)   Received from [facility] - Transportation    In the past 12 months, has lack of transportation kept you from medical appointments or from getting medications?: No    Lack of Transportation (Non-Medical): No  Physical Activity: Insufficiently Active (09/15/2021)   Exercise Vital Sign    Days of Exercise per Week: 7 days    Minutes of  Exercise per Session: 20 min  Stress: No Stress Concern Present (09/15/2021)   [employer] of Occupational Health - Occupational Stress  Questionnaire    Feeling of Stress : Not at all  Social Connections: Moderately Isolated (09/15/2021)   Social Connection and Isolation Panel [NHANES]    Frequency of Communication with Friends and Family: Once a week    Frequency of Social Gatherings with Friends and Family: Once a week    Attends Religious Services: More than 4 times per year    Active Member of [employer] or Organizations: No    Attends Banker Meetings: Never    Marital Status: Married  Catering manager Violence: Not At Risk (04/18/2023)   Humiliation, Afraid, Rape, and Kick questionnaire    Fear of Current or Ex-Partner: No    Emotionally Abused: No    Physically Abused: No    Sexually Abused: No    Vital Signs: Blood pressure 119/60, pulse 68, temperature 97.8 F (36.6 C), resp. rate 16, height 5\' 6"  (1.676 m), weight 192 lb (87.1 kg), SpO2 96%.  Examination: General Appearance: The patient is well-developed, well-nourished, and in no distress. Skin: Gross inspection of skin unremarkable. Head: normocephalic, no gross deformities. Eyes: no gross deformities noted. ENT: ears appear grossly normal no exudates. Neck: Supple. No thyromegaly. No LAD. Respiratory: no rhonchi noted. Cardiovascular: Normal S1 and S2 without murmur or rub. Extremities: No cyanosis. pulses are equal. Neurologic: Alert and oriented. No involuntary movements.  LABS: Recent Results (from the past 2160 hour(s))  Comprehensive metabolic panel     Status: Abnormal   Collection Time: 04/18/23  4:49 AM  Result Value Ref Range   Sodium 130 (L) 135 - 145 mmol/L   Potassium 4.2 3.5 - 5.1 mmol/L   Chloride 95 (L) 98 - 111 mmol/L   CO2 21 (L) 22 - 32 mmol/L   Glucose, Bld 263 (H) 70 - 99 mg/dL    Comment: Glucose reference range applies only to samples taken after fasting for at least 8 hours.    BUN 78 (H) 8 - 23 mg/dL   Creatinine, Ser 7.82 (H) 0.61 - 1.24 mg/dL   Calcium 8.3 (L) 8.9 - 10.3 mg/dL   Total Protein 6.2 (L) 6.5 - 8.1 g/dL   Albumin 3.5 3.5 - 5.0 g/dL   AST 19 15 - 41 U/L   ALT 13 0 - 44 U/L   Alkaline Phosphatase 49 38 - 126 U/L   Total Bilirubin 1.0 0.3 - 1.2 mg/dL   GFR, Estimated 15 (L) >60 mL/min    Comment: (NOTE) Calculated using the CKD-EPI Creatinine Equation (2021)    Anion gap 14 5 - 15    Comment: Performed at [facility], [address] [place], [place] [address]  CBC with Differential/Platelet     Status: Abnormal   Collection Time: 04/18/23  4:49 AM  Result Value Ref Range   WBC 18.2 (H) 4.0 - 10.5 K/uL   RBC 3.23 (L) 4.22 - 5.81 MIL/uL   Hemoglobin 10.2 (L) 13.0 - 17.0 g/dL   HCT 30.8 (L) 65.7 - 84.6 %   MCV 98.8 80.0 - 100.0 fL   MCH 31.6 26.0 - 34.0 pg   MCHC 32.0 30.0 - 36.0 g/dL   RDW 96.2 95.2 - 84.1 %   Platelets 229 150 - 400 K/uL   nRBC 0.0 0.0 - 0.2 %   Neutrophils Relative % 86 %   Neutro Abs 15.5 (H) 1.7 - 7.7 K/uL   Lymphocytes Relative 4 %   Lymphs Abs 0.7 0.7 - 4.0 K/uL   Monocytes Relative 8 %   Monocytes  Absolute 1.5 (H) 0.1 - 1.0 K/uL   Eosinophils Relative 1 %   Eosinophils Absolute 0.3 0.0 - 0.5 K/uL   Basophils Relative 0 %   Basophils Absolute 0.1 0.0 - 0.1 K/uL   Immature Granulocytes 1 %   Abs Immature Granulocytes 0.16 (H) 0.00 - 0.07 K/uL    Comment: Performed at [facility], [address] [place], [place] [address]  Brain natriuretic peptide     Status: Abnormal   Collection Time: 04/18/23  4:49 AM  Result Value Ref Range   B Natriuretic Peptide 283.4 (H) 0.0 - 100.0 pg/mL    Comment: Performed at [facility], [address] [place], [place] [address]  Troponin I (High Sensitivity)     Status: Abnormal   Collection Time: 04/18/23  4:49 AM  Result Value Ref Range   Troponin I (High Sensitivity) 34 (H) <18 ng/L    Comment: (NOTE) Elevated high sensitivity troponin I  (hsTnI) values and significant  changes across serial measurements may suggest ACS but many other  chronic and acute conditions are known to elevate hsTnI results.  Refer to the Links section for chest pain algorithms and additional  guidance. Performed at [facility], [address], [place], [place] [address]   Magnesium     Status: None   Collection Time: 04/18/23  4:49 AM  Result Value Ref Range   Magnesium 2.2 1.7 - 2.4 mg/dL    Comment: Performed at [facility], [address] [place], [place] [address]  Troponin I (High Sensitivity)     Status: Abnormal   Collection Time: 04/18/23  6:34 AM  Result Value Ref Range   Troponin I (High Sensitivity) 90 (H) <18 ng/L    Comment: READ BACK AND VERIFIED WITH JACQUELYN FERGUSON AT 0726 04/18/23 DAS (NOTE) Elevated high sensitivity troponin I (hsTnI) values and significant  changes across serial measurements may suggest ACS but many other  chronic and acute conditions are known to elevate hsTnI results.  Refer to the "Links" section for chest pain algorithms and additional  guidance. Performed at [facility], [address] [place], [place] [address]   Protime-INR     Status: None   Collection Time: 04/18/23  7:49 AM  Result Value Ref Range   Prothrombin Time 15.2 11.4 - 15.2 seconds   INR 1.2 0.8 - 1.2    Comment: (NOTE) INR goal varies based on device and disease states. Performed at [facility], [address] [place], [place] [address]   APTT     Status: None   Collection Time: 04/18/23  7:49 AM  Result Value Ref Range   aPTT 34 24 - 36 seconds    Comment: Performed at [facility], [address] [place], [place] [address]  Urinalysis, w/ Reflex to Culture (Infection Suspected) -Urine, Clean Catch     Status: Abnormal   Collection Time: 04/18/23  9:08 AM  Result Value Ref Range   Specimen Source URINE, CLEAN CATCH    Color, Urine YELLOW (A) YELLOW   APPearance  CLEAR (A) CLEAR   Specific Gravity, Urine 1.008 1.005 - 1.030   pH 5.0 5.0 - 8.0   Glucose, UA >=500 (A) NEGATIVE mg/dL   Hgb urine dipstick NEGATIVE NEGATIVE   Bilirubin Urine NEGATIVE NEGATIVE   Ketones, ur NEGATIVE NEGATIVE mg/dL   Protein, ur NEGATIVE NEGATIVE mg/dL   Nitrite NEGATIVE NEGATIVE   Leukocytes,Ua NEGATIVE NEGATIVE   WBC, UA NONE SEEN 0 -  5 WBC/hpf    Comment:        Reflex urine culture not performed if WBC <=10, OR if Squamous epithelial cells >5. If Squamous epithelial cells >5 suggest recollection.    Bacteria, UA NONE SEEN NONE SEEN   Squamous Epithelial / HPF NONE SEEN 0 - 5 /HPF   Mucus PRESENT    Hyaline Casts, UA PRESENT     Comment: Performed at [facility], [address] [place], [place] [address]  Culture, blood (Routine X 2) w Reflex to ID Panel     Status: None   Collection Time: 04/18/23  9:42 AM   Specimen: BLOOD  Result Value Ref Range   Specimen Description BLOOD BLOOD LEFT HAND    Special Requests      BOTTLES DRAWN AEROBIC AND ANAEROBIC Blood Culture adequate volume   Culture      NO GROWTH 5 DAYS Performed at [facility], [address] [place], [place] [address]    Report Status 04/23/2023 FINAL   Culture, blood (Routine X 2) w Reflex to ID Panel     Status: None   Collection Time: 04/18/23  9:42 AM   Specimen: BLOOD  Result Value Ref Range   Specimen Description BLOOD LEFT ANTECUBITAL    Special Requests      BOTTLES DRAWN AEROBIC AND ANAEROBIC Blood Culture adequate volume   Culture      NO GROWTH 5 DAYS Performed at [facility], [address], [place], [place] [address]    Report Status 04/23/2023 FINAL   CBG monitoring, ED     Status: Abnormal   Collection Time: 04/18/23 11:44 AM  Result Value Ref Range   Glucose-Capillary 270 (H) 70 - 99 mg/dL    Comment: Glucose reference range applies only to samples taken after fasting for at least 8 hours.  Troponin I (High Sensitivity)     Status:  Abnormal   Collection Time: 04/18/23 12:35 PM  Result Value Ref Range   Troponin I (High Sensitivity) 454 (HH) <18 ng/L    Comment: CRITICAL RESULT CALLED TO, READ BACK BY AND VERIFIED WITH  JACQUE FERGUSON 04/18/23 1340 MU (NOTE) Elevated high sensitivity troponin I (hsTnI) values and significant  changes across serial measurements may suggest ACS but many other  chronic and acute conditions are known to elevate hsTnI results.  Refer to the "Links" section for chest pain algorithms and additional  guidance. Performed at [facility], [address] [place], [place] [address]   ECHOCARDIOGRAM COMPLETE     Status: None   Collection Time: 04/18/23  2:32 PM  Result Value Ref Range   Weight 3,354.52 oz   BP 124/49 mmHg   Ao pk vel 3.14 m/s   AV Area VTI 1.27 cm2   AR max vel 1.25 cm2   AV Mean grad 22.0 mmHg   AV Peak grad 39.4 mmHg   S' Lateral 2.70 cm   AV Area mean vel 1.24 cm2   Area-P 1/2 3.15 cm2   MV VTI 2.39 cm2   Est EF 55 - 60%   CBG monitoring, ED     Status: Abnormal   Collection Time: 04/18/23  4:46 PM  Result Value Ref Range   Glucose-Capillary 209 (H) 70 - 99 mg/dL    Comment: Glucose reference range applies only to samples taken after fasting for at least 8 hours.  Heparin level (unfractionated)     Status: Abnormal   Collection Time: 04/18/23  5:03  PM  Result Value Ref Range   Heparin Unfractionated 0.16 (L) 0.30 - 0.70 IU/mL    Comment: (NOTE) The clinical reportable range upper limit is being lowered to >1.10 to align with the FDA approved guidance for the current laboratory assay.  If heparin results are below expected values, and patient dosage has  been confirmed, suggest follow up testing of antithrombin III levels. Performed at [facility], [address] [place], [place] [address]   Troponin I (High Sensitivity)     Status: Abnormal   Collection Time: 04/18/23  5:03 PM  Result Value Ref Range   Troponin I (High Sensitivity)  801 (HH) <18 ng/L    Comment: CRITICAL VALUE NOTED. VALUE IS CONSISTENT WITH PREVIOUSLY REPORTED/CALLED VALUE SS (NOTE) Elevated high sensitivity troponin I (hsTnI) values and significant  changes across serial measurements may suggest ACS but many other  chronic and acute conditions are known to elevate hsTnI results.  Refer to the "Links" section for chest pain algorithms and additional  guidance. Performed at [facility], [address] [place], [place] [address]   CBG monitoring, ED     Status: Abnormal   Collection Time: 04/18/23  9:14 PM  Result Value Ref Range   Glucose-Capillary 189 (H) 70 - 99 mg/dL    Comment: Glucose reference range applies only to samples taken after fasting for at least 8 hours.  CBG monitoring, ED     Status: Abnormal   Collection Time: 04/18/23 10:23 PM  Result Value Ref Range   Glucose-Capillary 207 (H) 70 - 99 mg/dL    Comment: Glucose reference range applies only to samples taken after fasting for at least 8 hours.  CBC     Status: Abnormal   Collection Time: 04/19/23  1:34 AM  Result Value Ref Range   WBC 13.4 (H) 4.0 - 10.5 K/uL   RBC 3.24 (L) 4.22 - 5.81 MIL/uL   Hemoglobin 10.4 (L) 13.0 - 17.0 g/dL   HCT 09.8 (L) 11.9 - 14.7 %   MCV 98.5 80.0 - 100.0 fL   MCH 32.1 26.0 - 34.0 pg   MCHC 32.6 30.0 - 36.0 g/dL   RDW 82.9 56.2 - 13.0 %   Platelets 237 150 - 400 K/uL   nRBC 0.0 0.0 - 0.2 %    Comment: Performed at [facility], [address] [place], [place] [address]  Comprehensive metabolic panel     Status: Abnormal   Collection Time: 04/19/23  1:34 AM  Result Value Ref Range   Sodium 133 (L) 135 - 145 mmol/L   Potassium 4.1 3.5 - 5.1 mmol/L   Chloride 94 (L) 98 - 111 mmol/L   CO2 25 22 - 32 mmol/L   Glucose, Bld 196 (H) 70 - 99 mg/dL    Comment: Glucose reference range applies only to samples taken after fasting for at least 8 hours.   BUN 80 (H) 8 - 23 mg/dL   Creatinine, Ser 4.69 (H) 0.61 - 1.24 mg/dL    Calcium 8.5 (L) 8.9 - 10.3 mg/dL   Total Protein 6.8 6.5 - 8.1 g/dL   Albumin 3.8 3.5 - 5.0 g/dL   AST 39 15 - 41 U/L   ALT 18 0 - 44 U/L   Alkaline Phosphatase 51 38 - 126 U/L   Total Bilirubin 1.0 0.3 - 1.2 mg/dL   GFR, Estimated 16 (L) >60 mL/min    Comment: (NOTE) Calculated using the CKD-EPI Creatinine Equation (2021)  Anion gap 14 5 - 15    Comment: Performed at [facility], [address] [place], [place] [address]  Troponin I (High Sensitivity)     Status: Abnormal   Collection Time: 04/19/23  1:34 AM  Result Value Ref Range   Troponin I (High Sensitivity) 782 (HH) <18 ng/L    Comment: CRITICAL VALUE NOTED. VALUE IS CONSISTENT WITH PREVIOUSLY REPORTED/CALLED VALUE SKL (NOTE) Elevated high sensitivity troponin I (hsTnI) values and significant  changes across serial measurements may suggest ACS but many other  chronic and acute conditions are known to elevate hsTnI results.  Refer to the "Links" section for chest pain algorithms and additional  guidance. Performed at [facility], [address] [place], [place] [address]   Hemoglobin A1c     Status: Abnormal   Collection Time: 04/19/23  1:34 AM  Result Value Ref Range   Hgb A1c MFr Bld 6.2 (H) 4.8 - 5.6 %    Comment: (NOTE) Pre diabetes:          5.7%-6.4%  Diabetes:              >6.4%  Glycemic control for   <7.0% adults with diabetes    Mean Plasma Glucose 131.24 mg/dL    Comment: Performed at [facility], [address], [place], [place] [address]  Heparin level (unfractionated)     Status: Abnormal   Collection Time: 04/19/23  1:34 AM  Result Value Ref Range   Heparin Unfractionated 0.28 (L) 0.30 - 0.70 IU/mL    Comment: (NOTE) The clinical reportable range upper limit is being lowered to >1.10 to align with the FDA approved guidance for the current laboratory assay.  If heparin results are below expected values, and patient dosage has  been confirmed, suggest follow up  testing of antithrombin III levels. Performed at [facility], [address] [place], [place] [address]   CBG monitoring, ED     Status: Abnormal   Collection Time: 04/19/23  9:01 AM  Result Value Ref Range   Glucose-Capillary 310 (H) 70 - 99 mg/dL    Comment: Glucose reference range applies only to samples taken after fasting for at least 8 hours.  CBG monitoring, ED     Status: Abnormal   Collection Time: 04/19/23 11:31 AM  Result Value Ref Range   Glucose-Capillary 261 (H) 70 - 99 mg/dL    Comment: Glucose reference range applies only to samples taken after fasting for at least 8 hours.  Glucose, capillary     Status: Abnormal   Collection Time: 04/19/23  5:02 PM  Result Value Ref Range   Glucose-Capillary 212 (H) 70 - 99 mg/dL    Comment: Glucose reference range applies only to samples taken after fasting for at least 8 hours.  Heparin level (unfractionated)     Status: None   Collection Time: 04/19/23  7:26 PM  Result Value Ref Range   Heparin Unfractionated 0.42 0.30 - 0.70 IU/mL    Comment: (NOTE) The clinical reportable range upper limit is being lowered to >1.10 to align with the FDA approved guidance for the current laboratory assay.  If heparin results are below expected values, and patient dosage has  been confirmed, suggest follow up testing of antithrombin III levels. Performed at [facility], [address] [place], [place] [address]   Glucose, capillary     Status: Abnormal   Collection Time: 04/19/23  9:17 PM  Result Value Ref Range  Glucose-Capillary 245 (H) 70 - 99 mg/dL    Comment: Glucose reference range applies only to samples taken after fasting for at least 8 hours.  CBC     Status: Abnormal   Collection Time: 04/20/23  3:44 AM  Result Value Ref Range   WBC 10.9 (H) 4.0 - 10.5 K/uL   RBC 3.38 (L) 4.22 - 5.81 MIL/uL   Hemoglobin 10.7 (L) 13.0 - 17.0 g/dL   HCT 78.2 (L) 95.6 - 21.3 %   MCV 96.7 80.0 - 100.0 fL   MCH 31.7  26.0 - 34.0 pg   MCHC 32.7 30.0 - 36.0 g/dL   RDW 08.6 57.8 - 46.9 %   Platelets 266 150 - 400 K/uL   nRBC 0.0 0.0 - 0.2 %    Comment: Performed at [facility], [address], [place], [place] [address]  Basic metabolic panel     Status: Abnormal   Collection Time: 04/20/23  3:44 AM  Result Value Ref Range   Sodium 133 (L) 135 - 145 mmol/L   Potassium 3.8 3.5 - 5.1 mmol/L   Chloride 95 (L) 98 - 111 mmol/L   CO2 24 22 - 32 mmol/L   Glucose, Bld 173 (H) 70 - 99 mg/dL    Comment: Glucose reference range applies only to samples taken after fasting for at least 8 hours.   BUN 73 (H) 8 - 23 mg/dL   Creatinine, Ser 8.41 (H) 0.61 - 1.24 mg/dL   Calcium 8.9 8.9 - 32.4 mg/dL   GFR, Estimated 19 (L) >60 mL/min    Comment: (NOTE) Calculated using the CKD-EPI Creatinine Equation (2021)    Anion gap 14 5 - 15    Comment: Performed at [facility], [address] [place], [place] [address]  Magnesium     Status: Abnormal   Collection Time: 04/20/23  3:44 AM  Result Value Ref Range   Magnesium 2.6 (H) 1.7 - 2.4 mg/dL    Comment: Performed at [facility], [address] [place], [place] [address]  Heparin level (unfractionated)     Status: None   Collection Time: 04/20/23  3:44 AM  Result Value Ref Range   Heparin Unfractionated 0.51 0.30 - 0.70 IU/mL    Comment: (NOTE) The clinical reportable range upper limit is being lowered to >1.10 to align with the FDA approved guidance for the current laboratory assay.  If heparin results are below expected values, and patient dosage has  been confirmed, suggest follow up testing of antithrombin III levels. Performed at [facility], [address] [place], [place] [address]   Glucose, capillary     Status: Abnormal   Collection Time: 04/20/23  8:38 AM  Result Value Ref Range   Glucose-Capillary 200 (H) 70 - 99 mg/dL    Comment: Glucose reference range applies only to samples taken after fasting for  at least 8 hours.  Glucose, capillary     Status: Abnormal   Collection Time: 04/20/23 12:56 PM  Result Value Ref Range   Glucose-Capillary 245 (H) 70 - 99 mg/dL    Comment: Glucose reference range applies only to samples taken after fasting for at least 8 hours.  Glucose, capillary     Status: Abnormal   Collection Time: 04/20/23  5:46 PM  Result Value Ref Range   Glucose-Capillary 235 (H) 70 - 99 mg/dL    Comment: Glucose reference range applies only to samples taken after fasting for at least 8 hours.  Glucose,  capillary     Status: Abnormal   Collection Time: 04/20/23  8:33 PM  Result Value Ref Range   Glucose-Capillary 272 (H) 70 - 99 mg/dL    Comment: Glucose reference range applies only to samples taken after fasting for at least 8 hours.  Basic metabolic panel     Status: Abnormal   Collection Time: 04/21/23  4:04 AM  Result Value Ref Range   Sodium 133 (L) 135 - 145 mmol/L   Potassium 3.6 3.5 - 5.1 mmol/L   Chloride 95 (L) 98 - 111 mmol/L   CO2 24 22 - 32 mmol/L   Glucose, Bld 231 (H) 70 - 99 mg/dL    Comment: Glucose reference range applies only to samples taken after fasting for at least 8 hours.   BUN 74 (H) 8 - 23 mg/dL   Creatinine, Ser 6.04 (H) 0.61 - 1.24 mg/dL   Calcium 8.8 (L) 8.9 - 10.3 mg/dL   GFR, Estimated 17 (L) >60 mL/min    Comment: (NOTE) Calculated using the CKD-EPI Creatinine Equation (2021)    Anion gap 14 5 - 15    Comment: Performed at [facility], [address] [place], [place] [address]  Magnesium     Status: Abnormal   Collection Time: 04/21/23  4:04 AM  Result Value Ref Range   Magnesium 2.5 (H) 1.7 - 2.4 mg/dL    Comment: Performed at [facility], [address] [place], [place] [address]  CBC     Status: Abnormal   Collection Time: 04/21/23  4:04 AM  Result Value Ref Range   WBC 10.7 (H) 4.0 - 10.5 K/uL   RBC 3.41 (L) 4.22 - 5.81 MIL/uL   Hemoglobin 11.0 (L) 13.0 - 17.0 g/dL   HCT 78.2 (L) 95.6 - 21.3 %   MCV  96.5 80.0 - 100.0 fL   MCH 32.3 26.0 - 34.0 pg   MCHC 33.4 30.0 - 36.0 g/dL   RDW 08.6 57.8 - 46.9 %   Platelets 300 150 - 400 K/uL   nRBC 0.0 0.0 - 0.2 %    Comment: Performed at [facility], [address] [place], [place] [address]  Glucose, capillary     Status: Abnormal   Collection Time: 04/21/23  7:26 AM  Result Value Ref Range   Glucose-Capillary 206 (H) 70 - 99 mg/dL    Comment: Glucose reference range applies only to samples taken after fasting for at least 8 hours.  Glucose, capillary     Status: Abnormal   Collection Time: 04/21/23 11:33 AM  Result Value Ref Range   Glucose-Capillary 301 (H) 70 - 99 mg/dL    Comment: Glucose reference range applies only to samples taken after fasting for at least 8 hours.  Glucose, capillary     Status: Abnormal   Collection Time: 04/21/23  4:26 PM  Result Value Ref Range   Glucose-Capillary 169 (H) 70 - 99 mg/dL    Comment: Glucose reference range applies only to samples taken after fasting for at least 8 hours.  Glucose, capillary     Status: Abnormal   Collection Time: 04/21/23  4:59 PM  Result Value Ref Range   Glucose-Capillary 141 (H) 70 - 99 mg/dL    Comment: Glucose reference range applies only to samples taken after fasting for at least 8 hours.  Glucose, capillary     Status: Abnormal   Collection Time: 04/21/23  8:01 PM  Result Value Ref Range   Glucose-Capillary 227 (  H) 70 - 99 mg/dL    Comment: Glucose reference range applies only to samples taken after fasting for at least 8 hours.  Basic metabolic panel     Status: Abnormal   Collection Time: 04/22/23  4:34 AM  Result Value Ref Range   Sodium 131 (L) 135 - 145 mmol/L   Potassium 3.7 3.5 - 5.1 mmol/L   Chloride 94 (L) 98 - 111 mmol/L   CO2 25 22 - 32 mmol/L   Glucose, Bld 160 (H) 70 - 99 mg/dL    Comment: Glucose reference range applies only to samples taken after fasting for at least 8 hours.   BUN 69 (H) 8 - 23 mg/dL   Creatinine, Ser 0.98 (H) 0.61 -  1.24 mg/dL   Calcium 8.7 (L) 8.9 - 10.3 mg/dL   GFR, Estimated 20 (L) >60 mL/min    Comment: (NOTE) Calculated using the CKD-EPI Creatinine Equation (2021)    Anion gap 12 5 - 15    Comment: Performed at [facility], [address] [place], [place] [address]  Magnesium     Status: Abnormal   Collection Time: 04/22/23  4:34 AM  Result Value Ref Range   Magnesium 2.5 (H) 1.7 - 2.4 mg/dL    Comment: Performed at [facility] [address] [place], [place] [address]  CBC     Status: Abnormal   Collection Time: 04/22/23  4:34 AM  Result Value Ref Range   WBC 14.0 (H) 4.0 - 10.5 K/uL   RBC 3.56 (L) 4.22 - 5.81 MIL/uL   Hemoglobin 11.3 (L) 13.0 - 17.0 g/dL   HCT 62.1 (L) 30.8 - 65.7 %   MCV 95.8 80.0 - 100.0 fL   MCH 31.7 26.0 - 34.0 pg   MCHC 33.1 30.0 - 36.0 g/dL   RDW 84.6 96.2 - 95.2 %   Platelets 296 150 - 400 K/uL   nRBC 0.0 0.0 - 0.2 %    Comment: Performed at [facility], [address] [place], [place] [address]  Glucose, capillary     Status: Abnormal   Collection Time: 04/22/23  7:50 AM  Result Value Ref Range   Glucose-Capillary 177 (H) 70 - 99 mg/dL    Comment: Glucose reference range applies only to samples taken after fasting for at least 8 hours.  Glucose, capillary     Status: Abnormal   Collection Time: 04/22/23  1:10 PM  Result Value Ref Range   Glucose-Capillary 290 (H) 70 - 99 mg/dL    Comment: Glucose reference range applies only to samples taken after fasting for at least 8 hours.  Glucose, capillary     Status: Abnormal   Collection Time: 04/22/23  4:15 PM  Result Value Ref Range   Glucose-Capillary 173 (H) 70 - 99 mg/dL    Comment: Glucose reference range applies only to samples taken after fasting for at least 8 hours.  Glucose, capillary     Status: Abnormal   Collection Time: 04/22/23  8:21 PM  Result Value Ref Range   Glucose-Capillary 246 (H) 70 - 99 mg/dL    Comment: Glucose reference range applies only to  samples taken after fasting for at least 8 hours.  Basic metabolic panel     Status: Abnormal   Collection Time: 04/23/23  4:15 AM  Result Value Ref Range   Sodium 134 (L) 135 - 145 mmol/L   Potassium 4.0 3.5 - 5.1 mmol/L   Chloride 101 98 - 111 mmol/L  CO2 22 22 - 32 mmol/L   Glucose, Bld 181 (H) 70 - 99 mg/dL    Comment: Glucose reference range applies only to samples taken after fasting for at least 8 hours.   BUN 64 (H) 8 - 23 mg/dL   Creatinine, Ser 1.61 (H) 0.61 - 1.24 mg/dL   Calcium 8.7 (L) 8.9 - 10.3 mg/dL   GFR, Estimated 24 (L) >60 mL/min    Comment: (NOTE) Calculated using the CKD-EPI Creatinine Equation (2021)    Anion gap 11 5 - 15    Comment: Performed at [facility], [address] [place], [place] [address]  Magnesium     Status: None   Collection Time: 04/23/23  4:15 AM  Result Value Ref Range   Magnesium 2.4 1.7 - 2.4 mg/dL    Comment: Performed at [facility], [address] [place], [place] [address]  CBC     Status: Abnormal   Collection Time: 04/23/23  4:15 AM  Result Value Ref Range   WBC 15.6 (H) 4.0 - 10.5 K/uL   RBC 3.61 (L) 4.22 - 5.81 MIL/uL   Hemoglobin 11.4 (L) 13.0 - 17.0 g/dL   HCT 11.9 (L) 14.7 - 82.9 %   MCV 96.1 80.0 - 100.0 fL   MCH 31.6 26.0 - 34.0 pg   MCHC 32.9 30.0 - 36.0 g/dL   RDW 56.2 13.0 - 86.5 %   Platelets 317 150 - 400 K/uL   nRBC 0.0 0.0 - 0.2 %    Comment: Performed at [facility], [address] [place], [place] [address]  Glucose, capillary     Status: Abnormal   Collection Time: 04/23/23  7:29 AM  Result Value Ref Range   Glucose-Capillary 197 (H) 70 - 99 mg/dL    Comment: Glucose reference range applies only to samples taken after fasting for at least 8 hours.  Glucose, capillary     Status: Abnormal   Collection Time: 04/23/23 12:06 PM  Result Value Ref Range   Glucose-Capillary 244 (H) 70 - 99 mg/dL    Comment: Glucose reference range applies only to samples taken after fasting  for at least 8 hours.  Glucose, capillary     Status: Abnormal   Collection Time: 04/23/23  3:51 PM  Result Value Ref Range   Glucose-Capillary 188 (H) 70 - 99 mg/dL    Comment: Glucose reference range applies only to samples taken after fasting for at least 8 hours.  Glucose, capillary     Status: Abnormal   Collection Time: 04/23/23  9:05 PM  Result Value Ref Range   Glucose-Capillary 212 (H) 70 - 99 mg/dL    Comment: Glucose reference range applies only to samples taken after fasting for at least 8 hours.  Magnesium     Status: None   Collection Time: 04/24/23  4:36 AM  Result Value Ref Range   Magnesium 2.2 1.7 - 2.4 mg/dL    Comment: Performed at [facility], [address] [place], [place] [address]  CBC     Status: Abnormal   Collection Time: 04/24/23  4:36 AM  Result Value Ref Range   WBC 15.6 (H) 4.0 - 10.5 K/uL   RBC 3.43 (L) 4.22 - 5.81 MIL/uL   Hemoglobin 10.9 (L) 13.0 - 17.0 g/dL   HCT 84.1 (L) 32.4 - 40.1 %   MCV 96.8 80.0 - 100.0 fL   MCH 31.8 26.0 - 34.0 pg   MCHC 32.8 30.0 - 36.0 g/dL  RDW 14.5 11.5 - 15.5 %   Platelets 311 150 - 400 K/uL   nRBC 0.0 0.0 - 0.2 %    Comment: Performed at [facility], [address] [place], [place] [address]  Basic metabolic panel     Status: Abnormal   Collection Time: 04/24/23  4:36 AM  Result Value Ref Range   Sodium 134 (L) 135 - 145 mmol/L   Potassium 4.2 3.5 - 5.1 mmol/L   Chloride 103 98 - 111 mmol/L   CO2 23 22 - 32 mmol/L   Glucose, Bld 179 (H) 70 - 99 mg/dL    Comment: Glucose reference range applies only to samples taken after fasting for at least 8 hours.   BUN 60 (H) 8 - 23 mg/dL   Creatinine, Ser 2.72 (H) 0.61 - 1.24 mg/dL   Calcium 8.7 (L) 8.9 - 10.3 mg/dL   GFR, Estimated 26 (L) >60 mL/min    Comment: (NOTE) Calculated using the CKD-EPI Creatinine Equation (2021)    Anion gap 8 5 - 15    Comment: Performed at [facility], [address] [place], [place] [address]   Glucose, capillary     Status: Abnormal   Collection Time: 04/24/23  8:29 AM  Result Value Ref Range   Glucose-Capillary 222 (H) 70 - 99 mg/dL    Comment: Glucose reference range applies only to samples taken after fasting for at least 8 hours.  Glucose, capillary     Status: Abnormal   Collection Time: 04/24/23 11:47 AM  Result Value Ref Range   Glucose-Capillary 272 (H) 70 - 99 mg/dL    Comment: Glucose reference range applies only to samples taken after fasting for at least 8 hours.  Pulmonary Function Test     Status: None   Collection Time: 06/10/23  8:37 AM  Result Value Ref Range   FEV1     FVC     FEV1/FVC     TLC     DLCO    Vitamin B12     Status: None   Collection Time: 07/10/23 12:59 PM  Result Value Ref Range   Vitamin B-12 815 180 - 914 pg/mL    Comment: (NOTE) This assay is not validated for testing neonatal or myeloproliferative syndrome specimens for Vitamin B12 levels. Performed at [facility], [address] [place], [place] [address]   Retic Panel     Status: Abnormal   Collection Time: 07/10/23 12:59 PM  Result Value Ref Range   Retic Ct Pct 2.5 0.4 - 3.1 %   RBC. 4.04 (L) 4.22 - 5.81 MIL/uL   Retic Count, Absolute 101.0 19.0 - 186.0 K/uL   Immature Retic Fract 11.6 2.3 - 15.9 %   Reticulocyte Hemoglobin 33.5 >27.9 pg    Comment:        Given the high negative predictive value of a RET-He result > 32 pg iron deficiency is essentially excluded. If this patient is anemic other etiologies should be considered. Performed at [facility], [address] [place], [place] [address]   Ferritin     Status: None   Collection Time: 07/10/23 12:59 PM  Result Value Ref Range   Ferritin 87 24 - 336 ng/mL    Comment: Performed at [facility], [address] [place], [place] [address]  Iron and TIBC([facility])     Status: None   Collection Time: 07/10/23 12:59 PM  Result Value Ref Range   Iron 62 45 -  182 ug/dL   TIBC 161  096 - 045 ug/dL   Saturation Ratios 19 17.9 - 39.5 %   UIBC 267 ug/dL    Comment: Performed at [facility], [address] [place], [place] [address]  CBC with Differential/Platelet     Status: Abnormal   Collection Time: 07/10/23 12:59 PM  Result Value Ref Range   WBC 10.2 4.0 - 10.5 K/uL   RBC 4.02 (L) 4.22 - 5.81 MIL/uL   Hemoglobin 12.5 (L) 13.0 - 17.0 g/dL   HCT 19.1 (L) 47.8 - 29.5 %   MCV 92.3 80.0 - 100.0 fL   MCH 31.1 26.0 - 34.0 pg   MCHC 33.7 30.0 - 36.0 g/dL   RDW 62.1 30.8 - 65.7 %   Platelets 240 150 - 400 K/uL   nRBC 0.0 0.0 - 0.2 %   Neutrophils Relative % 77 %   Neutro Abs 7.8 (H) 1.7 - 7.7 K/uL   Lymphocytes Relative 8 %   Lymphs Abs 0.9 0.7 - 4.0 K/uL   Monocytes Relative 8 %   Monocytes Absolute 0.8 0.1 - 1.0 K/uL   Eosinophils Relative 5 %   Eosinophils Absolute 0.5 0.0 - 0.5 K/uL   Basophils Relative 1 %   Basophils Absolute 0.1 0.0 - 0.1 K/uL   Immature Granulocytes 1 %   Abs Immature Granulocytes 0.14 (H) 0.00 - 0.07 K/uL    Comment: Performed at [facility], [address], [place], [place] [address]    Radiology: ECHOCARDIOGRAM COMPLETE  Result Date: 04/18/2023    ECHOCARDIOGRAM REPORT   Patient Name:   Mike Decker Date of Exam: 04/18/2023 Medical Rec #:  [id number]            Height:       66.0 in Accession #:    [id number]           Weight:       209.7 lb Date of Birth:  June 18, 1941            BSA:          2.040 m Patient Age:    81 years             BP:           128/58 mmHg Patient Gender: M                    HR:           75 bpm. Exam Location:  [facility] Procedure: 2D Echo, Cardiac Doppler and Color Doppler Indications:     Aortic Stenosis  History:         Patient has prior history of Echocardiogram examinations, most                  recent 08/15/2022. CHF, CAD, Acute MI and Angina, PAD, TIA and                  COPD, Aortic Valve Disease, Signs/Symptoms:Chest Pain and                  Shortness of Breath; Risk  Factors:Hypertension, Sleep Apnea and                  Dyslipidemia. PVD.  Sonographer:     Mikki Harbor Referring Phys:  [id number] LILY MICHELLE TANG Diagnosing Phys: Marcina Millard MD  Sonographer Comments: Technically difficult study due to poor echo windows and suboptimal subcostal window. Image acquisition  challenging due to COPD. IMPRESSIONS  1. Left ventricular ejection fraction, by estimation, is 55 to 60%. The left ventricle has normal function. The left ventricle has no regional wall motion abnormalities. Left ventricular diastolic parameters are consistent with Grade I diastolic dysfunction (impaired relaxation).  2. Right ventricular systolic function is normal. The right ventricular size is normal. There is normal pulmonary artery systolic pressure.  3. The mitral valve is normal in structure. Mild to moderate mitral valve regurgitation. No evidence of mitral stenosis.  4. The aortic valve is normal in structure. Aortic valve regurgitation is not visualized. Moderate aortic valve stenosis.  5. The inferior vena cava is normal in size with greater than 50% respiratory variability, suggesting right atrial pressure of 3 mmHg. FINDINGS  Left Ventricle: Left ventricular ejection fraction, by estimation, is 55 to 60%. The left ventricle has normal function. The left ventricle has no regional wall motion abnormalities. The left ventricular internal cavity size was normal in size. There is  no left ventricular hypertrophy. Left ventricular diastolic parameters are consistent with Grade I diastolic dysfunction (impaired relaxation). Right Ventricle: The right ventricular size is normal. No increase in right ventricular wall thickness. Right ventricular systolic function is normal. There is normal pulmonary artery systolic pressure. The tricuspid regurgitant velocity is 2.25 m/s, and  with an assumed right atrial pressure of 8 mmHg, the estimated right ventricular systolic pressure is 28.2 mmHg. Left  Atrium: Left atrial size was normal in size. Right Atrium: Right atrial size was normal in size. Pericardium: There is no evidence of pericardial effusion. Mitral Valve: The mitral valve is normal in structure. Mild to moderate mitral valve regurgitation. No evidence of mitral valve stenosis. MV peak gradient, 6.4 mmHg. The mean mitral valve gradient is 3.0 mmHg. Tricuspid Valve: The tricuspid valve is normal in structure. Tricuspid valve regurgitation is not demonstrated. No evidence of tricuspid stenosis. Aortic Valve: The aortic valve is normal in structure. Aortic valve regurgitation is not visualized. Moderate aortic stenosis is present. Aortic valve mean gradient measures 22.0 mmHg. Aortic valve peak gradient measures 39.4 mmHg. Aortic valve area, by VTI measures 1.27 cm. Pulmonic Valve: The pulmonic valve was normal in structure. Pulmonic valve regurgitation is not visualized. No evidence of pulmonic stenosis. Aorta: The aortic root is normal in size and structure. Venous: The inferior vena cava is normal in size with greater than 50% respiratory variability, suggesting right atrial pressure of 3 mmHg. IAS/Shunts: No atrial level shunt detected by color flow Doppler.  LEFT VENTRICLE PLAX 2D LVIDd:         3.80 cm   Diastology LVIDs:         2.70 cm   LV e' medial:    5.55 cm/s LV PW:         1.30 cm   LV E/e' medial:  16.6 LV IVS:        1.20 cm   LV e' lateral:   8.49 cm/s LVOT diam:     2.10 cm   LV E/e' lateral: 10.9 LV SV:         92 LV SV Index:   45 LVOT Area:     3.46 cm  RIGHT VENTRICLE RV Basal diam:  3.80 cm RV Mid diam:    3.10 cm RV S prime:     16.80 cm/s TAPSE (M-mode): 2.6 cm LEFT ATRIUM             Index        RIGHT ATRIUM  Index LA diam:        4.50 cm 2.21 cm/m   RA Area:     12.20 cm LA Vol (A2C):   80.7 ml 39.55 ml/m  RA Volume:   24.90 ml  12.20 ml/m LA Vol (A4C):   60.5 ml 29.65 ml/m LA Biplane Vol: 72.6 ml 35.58 ml/m  AORTIC VALVE                     PULMONIC VALVE AV  Area (Vmax):    1.25 cm      PV Vmax:       1.54 m/s AV Area (Vmean):   1.24 cm      PV Peak grad:  9.5 mmHg AV Area (VTI):     1.27 cm AV Vmax:           314.00 cm/s AV Vmean:          216.667 cm/s AV VTI:            0.726 m AV Peak Grad:      39.4 mmHg AV Mean Grad:      22.0 mmHg LVOT Vmax:         113.00 cm/s LVOT Vmean:        77.300 cm/s LVOT VTI:          0.267 m LVOT/AV VTI ratio: 0.37  AORTA Ao Root diam: 3.70 cm Ao Asc diam:  3.30 cm MITRAL VALVE                TRICUSPID VALVE MV Area (PHT): 3.15 cm     TR Peak grad:   20.2 mmHg MV Area VTI:   2.39 cm     TR Vmax:        225.00 cm/s MV Peak grad:  6.4 mmHg MV Mean grad:  3.0 mmHg     SHUNTS MV Vmax:       1.26 m/s     Systemic VTI:  0.27 m MV Vmean:      76.0 cm/s    Systemic Diam: 2.10 cm MV Decel Time: 241 msec MV E velocity: 92.20 cm/s MV A velocity: 121.00 cm/s MV E/A ratio:  0.76 Marcina Millard MD Electronically signed by Marcina Millard MD Signature Date/Time: 04/18/2023/5:22:03 PM    Final    DG Chest Portable 1 View  Result Date: 04/18/2023 CLINICAL DATA:  Chest pain EXAM: PORTABLE CHEST 1 VIEW COMPARISON:  12/12/2022 FINDINGS: Borderline heart size that is stable. Mild interstitial coarsening without Kerley line or air bronchogram. No effusion or pneumothorax. Artifact from EKG leads. IMPRESSION: No edema or focal pneumonia. Electronically Signed   By: Tiburcio Pea M.D.   On: 04/18/2023 05:15    No results found.  No results found.  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   Urinary tract infection without hematuria 11/01/2020   Dysuria 11/01/2020   Hyposmolality and/or hyponatremia 09/14/2020   Cough 09/13/2020   Bronchitis 07/15/2020   Palpitations 07/15/2020  Aortic atherosclerosis (HCC) 06/21/2020   Heart failure (HCC) 06/21/2020   Otalgia of both ears 05/27/2020   Lobar pneumonia (HCC)    Type 2 diabetes mellitus with stage 4 chronic kidney disease, without long-term current use of insulin (HCC) 12/14/2019   Elevated troponin    Acidosis 10/30/2019   Benign 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 10/30/2019   Hyperkalemia 10/30/2019   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/30/2019   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   Encounter for fitting and adjustment of hearing aid 06/16/2019   Health maintenance alteration 06/16/2019   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/16/2019   DM type 2 with diabetic peripheral neuropathy (HCC) 01/25/2019   CKD (chronic kidney disease) stage 4, GFR 15-29 ml/min (HCC) 01/21/2019   Gout 01/21/2019   MR (mitral regurgitation) 01/21/2019   Chronic obstructive pulmonary disease (HCC) 01/08/2019   OSA (obstructive sleep apnea) 01/08/2019   Acute on chronic diastolic CHF (congestive heart failure) (HCC) 12/30/2018   Polyp of sigmoid colon    Benign neoplasm of ascending colon    Hemorrhoids without complication    Diverticulosis of large intestine without diverticulitis    Reflux esophagitis    Lymphangiectasia    Arteriovenous malformation of duodenum    Symptomatic anemia    SOB (shortness of breath)    Iron deficiency anemia due to chronic blood loss    Unstable angina (HCC)    Anemia of chronic renal failure, stage 4 (severe) (HCC)    Chest pain 11/14/2018   Sciatica 11/12/2018   CAD (coronary artery disease) 02/27/2018   Sacroiliac joint pain 10/08/2017   Pain in limb 09/24/2017    Neuropathy 07/24/2017   NSTEMI (non-ST elevated myocardial infarction) (HCC) 05/06/2017   PAD (peripheral artery disease) (HCC) 05/01/2017   History of stroke 01/02/2017   Barrett's esophagus 10/02/2016   Carotid stenosis 10/02/2016   Essential hypertension 10/02/2016   Hyperlipidemia 10/02/2016   Diabetes (HCC) 10/02/2016   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 Chronic asthma, mild intermittent, uncomplicated Under better control we will continue with current inhaler regimen medical management.  2. OSA on CPAP Follow up PSG due to difficulty sleeping on current pressures. ?change in OSA severity  3. Moderate aortic stenosis He will follow-up with cardiology for this.  He was seen at [facility] recently  4. GERD without esophagitis Patient has significant reflux symptoms as well as cough and get follow-up with upper GI - DG UGI W DOUBLE CM (HD BA); Future  5. Chronic cough Multifactorial including history of asthma but also could have nocturnal reflux as a contributing factor. - DG UGI W DOUBLE CM (HD BA); Future    General Counseling: I have discussed the findings of the evaluation and examination with Sharlet Salina.  I have also discussed any further diagnostic evaluation thatmay be needed or ordered today. Jammar verbalizes understanding of the findings of todays visit. We also reviewed his medications today and discussed drug interactions and side effects including but not limited excessive drowsiness and altered mental states. We also discussed that there is always a risk not just to him but also people around him. he has been encouraged to call the office with any questions or concerns that should arise related to todays visit.  Orders Placed This Encounter  Procedures   DG UGI W  DOUBLE  CM (HD BA)    Standing Status:   Future    Standing Expiration Date:   07/14/2024    Order Specific Question:   Reason for Exam (SYMPTOM  OR DIAGNOSIS REQUIRED)    Answer:   GERD COUGH    Order Specific Question:   Preferred imaging location?    Answer:   [facility]   PSG Sleep Study    Standing Status:   Future    Standing Expiration Date:   07/14/2024    Order Specific Question:   Where should this test be performed:    Answer:   [facility]     Time spent: 24  I have personally obtained a history, examined the patient, evaluated laboratory and imaging results, formulated the assessment and plan and placed orders.    Yevonne Pax, MD [facility] Sleep medicine

## 2023-07-15 NOTE — Telephone Encounter (Signed)
Awaiting 07/15/23 office notes for SS order-Toni

## 2023-07-15 NOTE — Patient Instructions (Signed)
Sleep Apnea Sleep apnea affects breathing during sleep. It causes breathing to stop for 10 seconds or more, or to become shallow. People with sleep apnea usually snore loudly. It can also increase the risk of: Heart attack. Stroke. Being very overweight (obese). Diabetes. Heart failure. Irregular heartbeat. High blood pressure. The goal of treatment is to help you breathe normally again. What are the causes?  The most common cause of this condition is a collapsed or blocked airway. There are three kinds of sleep apnea: Obstructive sleep apnea. This is caused by a blocked or collapsed airway. Central sleep apnea. This happens when the brain does not send the right signals to the muscles that control breathing. Mixed sleep apnea. This is a combination of obstructive and central sleep apnea. What increases the risk? Being overweight. Smoking. Having a small airway. Being older. Being male. Drinking alcohol. Taking medicines to calm yourself (sedatives or tranquilizers). Having family members with the condition. Having a tongue or tonsils that are larger than normal. What are the signs or symptoms? Trouble staying asleep. Loud snoring. Headaches in the morning. Waking up gasping. Dry mouth or sore throat in the morning. Being sleepy or tired during the day. If you are sleepy or tired during the day, you may also: Not be able to focus your mind (concentrate). Forget things. Get angry a lot and have mood swings. Feel sad (depressed). Have changes in your personality. Have less interest in sex, if you are male. Be unable to have an erection, if you are male. How is this treated?  Sleeping on your side. Using a medicine to get rid of mucus in your nose (decongestant). Avoiding the use of alcohol, medicines to help you relax, or certain pain medicines (narcotics). Losing weight, if needed. Changing your diet. Quitting smoking. Using a machine to open your airway while you  sleep, such as: An oral appliance. This is a mouthpiece that shifts your lower jaw forward. A CPAP device. This device blows air through a mask when you breathe out (exhale). An EPAP device. This has valves that you put in each nostril. A BIPAP device. This device blows air through a mask when you breathe in (inhale) and breathe out. Having surgery if other treatments do not work. Follow these instructions at home: Lifestyle Make changes that your doctor recommends. Eat a healthy diet. Lose weight if needed. Avoid alcohol, medicines to help you relax, and some pain medicines. Do not smoke or use any products that contain nicotine or tobacco. If you need help quitting, ask your doctor. General instructions Take over-the-counter and prescription medicines only as told by your doctor. If you were given a machine to use while you sleep, use it only as told by your doctor. If you are having surgery, make sure to tell your doctor you have sleep apnea. You may need to bring your device with you. Keep all follow-up visits. Contact a doctor if: The machine that you were given to use during sleep bothers you or does not seem to be working. You do not get better. You get worse. Get help right away if: Your chest hurts. You have trouble breathing in enough air. You have an uncomfortable feeling in your back, arms, or stomach. You have trouble talking. One side of your body feels weak. A part of your face is hanging down. These symptoms may be an emergency. Get help right away. Call your local emergency services (911 in the [place] Do not wait to see if the   symptoms will go away. Do not drive yourself to the hospital. Summary This condition affects breathing during sleep. The most common cause is a collapsed or blocked airway. The goal of treatment is to help you breathe normally while you sleep. This information is not intended to replace advice given to you by your health care provider. Make  sure you discuss any questions you have with your health care provider. Document Revised: 07/05/2021 Document Reviewed: 11/04/2020 Elsevier Patient Education  2024 [employer].  

## 2023-07-15 NOTE — Telephone Encounter (Signed)
Lvm notifying patient of UGI appointment date, arrival time, location and npo after midnight-Mike Decker

## 2023-07-16 ENCOUNTER — Encounter: Payer: Self-pay | Admitting: Oncology

## 2023-07-16 ENCOUNTER — Inpatient Hospital Stay: Payer: Medicare Other | Attending: Oncology | Admitting: Oncology

## 2023-07-16 ENCOUNTER — Inpatient Hospital Stay: Payer: Medicare Other

## 2023-07-16 VITALS — BP 143/58 | HR 62 | Resp 16

## 2023-07-16 VITALS — BP 135/49 | HR 67 | Temp 97.7°F | Resp 18 | Wt 191.9 lb

## 2023-07-16 DIAGNOSIS — D631 Anemia in chronic kidney disease: Secondary | ICD-10-CM

## 2023-07-16 DIAGNOSIS — N184 Chronic kidney disease, stage 4 (severe): Secondary | ICD-10-CM | POA: Diagnosis not present

## 2023-07-16 DIAGNOSIS — I130 Hypertensive heart and chronic kidney disease with heart failure and stage 1 through stage 4 chronic kidney disease, or unspecified chronic kidney disease: Secondary | ICD-10-CM | POA: Diagnosis not present

## 2023-07-16 DIAGNOSIS — E538 Deficiency of other specified B group vitamins: Secondary | ICD-10-CM | POA: Insufficient documentation

## 2023-07-16 DIAGNOSIS — K22711 Barrett's esophagus with high grade dysplasia: Secondary | ICD-10-CM

## 2023-07-16 DIAGNOSIS — Z7902 Long term (current) use of antithrombotics/antiplatelets: Secondary | ICD-10-CM | POA: Diagnosis not present

## 2023-07-16 MED ORDER — SODIUM CHLORIDE 0.9 % IV SOLN
200.0000 mg | Freq: Once | INTRAVENOUS | Status: AC
  Administered 2023-07-16: 200 mg via INTRAVENOUS
  Filled 2023-07-16: qty 200

## 2023-07-16 MED ORDER — SODIUM CHLORIDE 0.9 % IV SOLN
Freq: Once | INTRAVENOUS | Status: AC
  Filled 2023-07-16: qty 250

## 2023-07-16 NOTE — Progress Notes (Signed)
Pt here for follow up. He reports he was in the hospital recently

## 2023-07-16 NOTE — Patient Instructions (Signed)
Iron Sucrose Injection What is this medication? IRON SUCROSE (EYE ern SOO krose) treats low levels of iron (iron deficiency anemia) in people with kidney disease. Iron is a mineral that plays an important role in making red blood cells, which carry oxygen from your lungs to the rest of your body. This medicine may be used for other purposes; ask your health care provider or pharmacist if you have questions. COMMON BRAND NAME(S): Venofer What should I tell my care team before I take this medication? They need to know if you have any of these conditions: Anemia not caused by low iron levels Heart disease High levels of iron in the blood Kidney disease Liver disease An unusual or allergic reaction to iron, other medications, foods, dyes, or preservatives Pregnant or trying to get pregnant Breastfeeding How should I use this medication? This medication is for infusion into a vein. It is given in a hospital or clinic setting. Talk to your care team about the use of this medication in children. While this medication may be prescribed for children as young as 2 years for selected conditions, precautions do apply. Overdosage: If you think you have taken too much of this medicine contact a poison control center or emergency room at once. NOTE: This medicine is only for you. Do not share this medicine with others. What if I miss a dose? Keep appointments for follow-up doses. It is important not to miss your dose. Call your care team if you are unable to keep an appointment. What may interact with this medication? Do not take this medication with any of the following: Deferoxamine Dimercaprol Other iron products This medication may also interact with the following: Chloramphenicol Deferasirox This list may not describe all possible interactions. Give your health care provider a list of all the medicines, herbs, non-prescription drugs, or dietary supplements you use. Also tell them if you smoke,  drink alcohol, or use illegal drugs. Some items may interact with your medicine. What should I watch for while using this medication? Visit your care team regularly. Tell your care team if your symptoms do not start to get better or if they get worse. You may need blood work done while you are taking this medication. You may need to follow a special diet. Talk to your care team. Foods that contain iron include: whole grains/cereals, dried fruits, beans, or peas, leafy green vegetables, and organ meats (liver, kidney). What side effects may I notice from receiving this medication? Side effects that you should report to your care team as soon as possible: Allergic reactions--skin rash, itching, hives, swelling of the face, lips, tongue, or throat Low blood pressure--dizziness, feeling faint or lightheaded, blurry vision Shortness of breath Side effects that usually do not require medical attention (report to your care team if they continue or are bothersome): Flushing Headache Joint pain Muscle pain Nausea Pain, redness, or irritation at injection site This list may not describe all possible side effects. Call your doctor for medical advice about side effects. You may report side effects to FDA at 1-800-FDA-1088. Where should I keep my medication? This medication is given in a hospital or clinic. It will not be stored at home. NOTE: This sheet is a summary. It may not cover all possible information. If you have questions about this medicine, talk to your doctor, pharmacist, or health care provider.  2024 Elsevier/Gold Standard (2023-05-03 00:00:00)

## 2023-07-16 NOTE — Assessment & Plan Note (Signed)
With history of high-grade dysplastic.  S/p resection.  Continue follow-up with gastroenterology.

## 2023-07-16 NOTE — Progress Notes (Signed)
Hematology/Oncology Follow Up Note [facility]  [phone] [phone] REASON FOR VISIT follow-up for management of iron deficiency anemia, anemia and CKD,   ASSESSMENT & PLAN:   Anemia of chronic renal failure, stage 4 (severe) (HCC) #Anemia in CKD Labs are reviewed and are discussed with patient Lab Results  Component Value Date   HGB 12.5 (L) 07/10/2023   TIBC 329 07/10/2023   IRONPCTSAT 19 07/10/2023   FERRITIN 87 07/10/2023    Ferritin is less than 200 Recommend IV Venofer x 1.  After that recommend patient to continue oral iron supplementation once daily.  Vitamin B12 deficiency History of Vitamin B12 deficiency, negative intrinsic factor antibody and antiparital antibody.  B12 level has improved.  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  S/p resection.  Continue follow-up with gastroenterology.   Orders Placed This Encounter  Procedures   CBC with Differential (Cancer Center Only)    Standing Status:   Future    Standing Expiration Date:   07/15/2024   Iron and TIBC    Standing Status:   Future    Standing Expiration Date:   07/15/2024   Ferritin    Standing Status:   Future    Standing Expiration Date:   07/15/2024   Retic Panel    Standing Status:   Future    Standing Expiration Date:   07/15/2024  Follow-up in 3 months.  All questions were answered. The patient knows to call the clinic with any problems, questions or concerns.  Rickard Patience, MD, PhD [facility] Hematology Oncology 07/16/2023     PERTINENT HEMATOLOGY Oncology HISTORY received IV Venofer daily x3 as well as multiple units of PRBC transfusion. GI was consulted and patient had EGD done during his hospitalization.  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  Treated with argon plasma coagulation.  Colonoscopy showed polyps which were  removed. Patient was discharged on 12/15/2018.  # 03/23/2019 mammogram showed gynecomastia.  #History of cutaneous melanoma status post Mohs surgery. 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 95% stenosis in the left circumflex status post drug-eluting stent. Patient is on aspirin and Plavix prior to this cardiac event. 04/18/2020, patient has a ER visit due to chest pain. Troponin was negative. Pain improved with medication. Patient was discharged with outpatient cardiology follow-up.  hospitalization 12/09/2022 in [facility] house due to chest pain/non-STEMI, started on heparin drip.  Seen by cardiology Dr. Juliann Pares.    INTERVAL HISTORY 82 y.o. male with PMH listed as below present to follow-up for anemia of CKD.  During interval, patient was admitted to [facility] due to acute on chronic CHF.  He was seen by cardiology posthospitalization.  Patient currently on Plavix. Today he denies chest pain, chronic shortness of breath at baseline.   Review of Systems  Constitutional:  Negative for appetite change, chills, fatigue, fever and unexpected weight change.  HENT:   Negative for hearing loss and voice change.   Eyes:  Negative for eye problems and icterus.  Respiratory:  Positive for shortness of breath. Negative for chest tightness and cough.   Cardiovascular:  Negative for chest pain and leg swelling.  Gastrointestinal:  Negative for abdominal distention and abdominal pain.  Endocrine: Negative for hot flashes.  Genitourinary:  Negative for difficulty urinating, dysuria and frequency.   Musculoskeletal:  Negative for arthralgias.  Skin:  Negative for itching and rash.  Neurological:  Negative for light-headedness and numbness.  Hematological:  Negative for adenopathy. Bruises/bleeds easily.  Psychiatric/Behavioral:  Negative for confusion.     Allergies  Allergen Reactions    Gabapentin     Other reaction(s): Other (See Comments) Tremors   Peanut-Containing Drug Products Anaphylaxis   Penicillins Hives and Rash    Has patient had a PCN reaction causing immediate rash, facial/tongue/throat swelling, SOB or lightheadedness with hypotension: Yes Has patient had a PCN reaction causing severe rash involving mucus membranes or skin necrosis: No Has patient had a PCN reaction that required hospitalization No Has patient had a PCN reaction occurring within the last 10 years: No If all of the above answers are "NO", then may proceed with Cephalosporin use.   Bee Venom Swelling   Influenza Vaccines Hives   Inh [Isoniazid] Hives   Jardiance [Empagliflozin] Other (See Comments)    Diarrhea   Kenalog [Triamcinolone Acetonide] Hives   Levaquin [Levofloxacin] Other (See Comments)    Tendon, ligament pain.    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, esphageal   Chronic diastolic CHF (congestive heart failure) (HCC)    Colon polyp    Diabetes mellitus without complication (HCC)    Diverticulosis    Gout    Heart disease    Hemangioma    liver   Hyperlipidemia    Hypertension    Myocardial infarct [facility])    Ocular hypertension    Peripheral vascular disease (HCC)    Skin cancer    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HCC)    Sleep apnea    Vitreoretinal degeneration      Past Surgical History:  Procedure Laterality Date   CATARACT EXTRACTION  2011, 2012   COLONOSCOPY N/A 12/14/2018   Procedure: COLONOSCOPY;  Surgeon: Pasty Spillers, MD;  Location: [facility] ENDOSCOPY;  Service: Endoscopy;  Laterality: N/A;   CORONARY ANGIOPLASTY WITH STENT PLACEMENT  12/10/2010    CORONARY STENT INTERVENTION N/A 12/29/2019   Procedure: CORONARY STENT INTERVENTION;  Surgeon: Yvonne Kendall, MD;  Location: [facility] INVASIVE CV LAB;  Service: Cardiovascular;  Laterality: N/A;   ESOPHAGOGASTRODUODENOSCOPY N/A 12/14/2018   Procedure: ESOPHAGOGASTRODUODENOSCOPY (EGD);  Surgeon: Pasty Spillers, MD;  Location: [facility] ENDOSCOPY;  Service: Endoscopy;  Laterality: N/A;   ESOPHAGOGASTRODUODENOSCOPY (EGD) WITH PROPOFOL N/A 06/01/2016   Procedure: ESOPHAGOGASTRODUODENOSCOPY (EGD) WITH PROPOFOL;  Surgeon: Scot Jun, MD;  Location: [facility] ENDOSCOPY;  Service: Endoscopy;  Laterality: N/A;   ESOPHAGOGASTRODUODENOSCOPY (EGD) WITH PROPOFOL N/A 01/31/2023   Procedure: ESOPHAGOGASTRODUODENOSCOPY (EGD) WITH PROPOFOL;  Surgeon: Midge Minium, MD;  Location: [facility] ENDOSCOPY;  Service: Endoscopy;  Laterality: N/A;   FEMORAL ARTERY STENT Right    HERNIA REPAIR     x2   INTRAOCULAR LENS INSERTION     LEFT HEART CATH AND CORONARY ANGIOGRAPHY N/A 05/09/2017   Procedure: Left Heart Cath and Coronary Angiography;  Surgeon: Alwyn Pea, MD;  Location: [facility] INVASIVE CV LAB;  Service: Cardiovascular;  Laterality: N/A;   LEFT HEART CATH AND CORONARY ANGIOGRAPHY N/A 12/08/2018  Procedure: LEFT HEART CATH AND CORONARY ANGIOGRAPHY and possible PCI and stent;  Surgeon: Alwyn Pea, MD;  Location: [facility] INVASIVE CV LAB;  Service: Cardiovascular;  Laterality: N/A;   LEFT HEART CATH AND CORONARY ANGIOGRAPHY N/A 12/29/2019   Procedure: LEFT HEART CATH AND CORONARY ANGIOGRAPHY;  Surgeon: Yvonne Kendall, MD;  Location: [facility] INVASIVE CV LAB;  Service: Cardiovascular;  Laterality: N/A;   LEFT HEART CATH AND CORONARY ANGIOGRAPHY Left 12/13/2022   Procedure: LEFT HEART CATH AND CORONARY ANGIOGRAPHY;  Surgeon: Alwyn Pea, MD;  Location: [facility] INVASIVE CV LAB;  Service: Cardiovascular;  Laterality: Left;   NOSE SURGERY     submucous resection   PROSTATE SURGERY  12/10/2000   ROTATOR CUFF REPAIR  Right     Social History   Socioeconomic History   Marital status: Married    Spouse name: Not on file   Number of children: 2   Years of education: College 3 years   Highest education level: Associate degree: occupational, Scientist, product/process development, or vocational program  Occupational History   Occupation: retired  Tobacco Use   Smoking status: Former    Current packs/day: 0.00    Types: Cigarettes    Start date: 12/10/1956    Quit date: 12/10/1976    Years since quitting: 46.6   Smokeless tobacco: Never  Vaping Use   Vaping status: Never Used  Substance and Sexual Activity   Alcohol use: No   Drug use: No   Sexual activity: Not Currently  Other Topics Concern   Not on file  Social History Narrative   Not on file   Social Determinants of Health   Financial Resource Strain: Low Risk  (06/21/2023)   Received from [facility]   Overall Financial Resource Strain (CARDIA)    Difficulty of Paying Living Expenses: Not very hard  Food Insecurity: No Food Insecurity (06/21/2023)   Received from [facility]   Hunger Vital Sign    Worried About Running Out of Food in the Last Year: Never true    Ran Out of Food in the Last Year: Never true  Transportation Needs: No Transportation Needs (06/21/2023)   Received from [facility] - Transportation    In the past 12 months, has lack of transportation kept you from medical appointments or from getting medications?: No    Lack of Transportation (Non-Medical): No  Physical Activity: Insufficiently Active (09/15/2021)   Exercise Vital Sign    Days of Exercise per Week: 7 days    Minutes of Exercise per Session: 20 min  Stress: No Stress Concern Present (09/15/2021)   [employer] of Occupational Health - Occupational Stress Questionnaire    Feeling of Stress : Not at all  Social Connections: Moderately Isolated (09/15/2021)   Social Connection and Isolation Panel [NHANES]    Frequency of  Communication with Friends and Family: Once a week    Frequency of Social Gatherings with Friends and Family: Once a week    Attends Religious Services: More than 4 times per year    Active Member of [employer] or Organizations: No    Attends Banker Meetings: Never    Marital Status: Married  Catering manager Violence: Not At Risk (04/18/2023)   Humiliation, Afraid, Rape, and Kick questionnaire    Fear of Current or Ex-Partner: No    Emotionally Abused: No    Physically Abused: No    Sexually Abused: No    Family History  Problem  Relation Age of Onset   Arthritis Mother    Stroke Maternal Grandfather    Breast cancer Neg Hx      Current Outpatient Medications:    acetaminophen (TYLENOL) 500 MG tablet, Take 1,000 mg by mouth every 8 (eight) hours as needed for mild pain., Disp: , Rfl:    allopurinol (ZYLOPRIM) 100 MG tablet, Take 100 mg by mouth daily., Disp: , Rfl:    atorvastatin (LIPITOR) 80 MG tablet, Take 1 tablet (80 mg total) by mouth daily., Disp: 30 tablet, Rfl: 0   azelastine (ASTELIN) 0.1 % nasal spray, USE 1 TO 2 SPRAYS IN EACH NOSTRIL TWICE A DAY, Disp: , Rfl:    calcitRIOL (ROCALTROL) 0.25 MCG capsule, Take 0.25 mcg by mouth daily., Disp: , Rfl:    cetirizine (ZYRTEC) 5 MG tablet, Take 5 mg by mouth daily., Disp: , Rfl:    citalopram (CELEXA) 10 MG tablet, Take 1 tablet (10 mg total) by mouth daily., Disp: 90 tablet, Rfl: 3   clopidogrel (PLAVIX) 75 MG tablet, TAKE 1 TABLET BY MOUTH EVERY DAY, Disp: 90 tablet, Rfl: 1   colchicine 0.6 MG tablet, Take 3 tablets (1.8 mg total) by mouth daily as needed (gout flares)., Disp: , Rfl:    [employer] VITAMIN B12 1000 MCG tablet, TAKE 1 TABLET BY MOUTH EVERY DAY, Disp: 30 tablet, Rfl: 0   ferrous sulfate 325 (65 FE) MG EC tablet, TAKE 1 TABLET BY MOUTH EVERY DAY, Disp: 90 tablet, Rfl: 3   fluticasone (FLONASE) 50 MCG/ACT nasal spray, USE 1 SPRAY IN EACH NOSTRIL EVERY DAY, Disp: 32 g, Rfl: 6   hydrALAZINE (APRESOLINE) 50 MG  tablet, Take 50 mg by mouth 2 (two) times daily., Disp: , Rfl:    insulin aspart (NOVOLOG) 100 UNIT/ML FlexPen, 10 UNITS IN AM, 30 UNITS AT LUNCH, 30 UNITS AT DINNER, Disp: , Rfl:    insulin glargine (LANTUS) 100 UNIT/ML injection, Inject 0.35 mLs (35 Units total) into the skin at bedtime. This is a decrease from your previous 45 units nightly. (Patient taking differently: Inject 54 Units into the skin at bedtime.), Disp: 10 mL, Rfl: 11   isosorbide mononitrate (IMDUR) 60 MG 24 hr tablet, Take 1 tablet (60 mg total) by mouth daily., Disp: 30 tablet, Rfl: 0   JARDIANCE 10 MG TABS tablet, Take 10 mg by mouth daily., Disp: , Rfl:    magnesium oxide (MAG-OX) 400 (240 Mg) MG tablet, Take 1 tablet by mouth daily., Disp: , Rfl:    methocarbamol (ROBAXIN) 500 MG tablet, Take 500 mg by mouth at bedtime., Disp: , Rfl: 0   metoprolol succinate (TOPROL-XL) 50 MG 24 hr tablet, Take 1 tablet by mouth daily., Disp: , Rfl:    mometasone (ASMANEX) 220 MCG/ACT inhaler, Inhale 2 puffs into the lungs daily., Disp: , Rfl:    montelukast (SINGULAIR) 10 MG tablet, TAKE 1 TABLET BY MOUTH EVERYDAY AT BEDTIME, Disp: 90 tablet, Rfl: 1   pantoprazole (PROTONIX) 40 MG tablet, Take 1 tablet (40 mg total) by mouth 2 (two) times daily before a meal., Disp: , Rfl:    ranolazine (RANEXA) 500 MG 12 hr tablet, Take 1 tablet (500 mg total) by mouth daily., Disp: 30 tablet, Rfl: 0   sodium bicarbonate 650 MG tablet, Take 650 mg by mouth 2 (two) times daily., Disp: , Rfl:    sodium fluoride (DENTA 5000 PLUS) 1.1 % CREA dental cream, See admin instructions., Disp: , Rfl:    Tiotropium Bromide-Olodaterol (STIOLTO RESPIMAT) 2.5-2.5 MCG/ACT AERS,  Inhale 1 each into the lungs daily., Disp: , Rfl:    Torsemide 40 MG TABS, Take 40 mg by mouth 2 (two) times daily., Disp: , Rfl:    diltiazem (CARDIZEM CD) 120 MG 24 hr capsule, Take 1 capsule (120 mg total) by mouth daily. (Patient not taking: Reported on 07/16/2023), Disp: 30 capsule, Rfl: 0    EPINEPHrine (EPI-PEN) 0.3 mg/0.3 mL SOAJ injection, Inject into the muscle as needed (anaphylaxis).  (Patient not taking: Reported on 07/16/2023), Disp: , Rfl:    ergocalciferol (VITAMIN D2) 1.25 MG (50000 UT) capsule, Take 50,000 Units by mouth every 30 (thirty) days. (Patient not taking: Reported on 07/16/2023), Disp: , Rfl:    folic acid (FOLVITE) 400 MCG tablet, Take 400 mcg by mouth daily. (Patient not taking: Reported on 07/16/2023), Disp: , Rfl:    furosemide (LASIX) 20 MG tablet, Take 2 tablets (40 mg total) by mouth daily. (Patient not taking: Reported on 07/16/2023), Disp: 30 tablet, Rfl: 0   nitroGLYCERIN (NITROSTAT) 0.4 MG SL tablet, Place 0.4 mg under the tongue every 5 (five) minutes x 3 doses as needed for chest pain.  (Patient not taking: Reported on 07/16/2023), Disp: , Rfl:   Physical exam:  Vitals:   07/16/23 1318  BP: (!) 135/49  Pulse: 67  Resp: 18  Temp: 97.7 F (36.5 C)  Weight: 191 lb 14.4 oz (87 kg)   Physical Exam Constitutional:      General: He is not in acute distress.    Appearance: He is obese.  HENT:     Head: Normocephalic and atraumatic.  Eyes:     General: No scleral icterus.    Pupils: Pupils are equal, round, and reactive to light.  Cardiovascular:     Rate and Rhythm: Normal rate and regular rhythm.     Heart sounds: Normal heart sounds.  Pulmonary:     Effort: Pulmonary effort is normal. No respiratory distress.     Breath sounds: No wheezing.  Abdominal:     General: Bowel sounds are normal. There is no distension.     Palpations: Abdomen is soft. There is no mass.     Tenderness: There is no abdominal tenderness.  Musculoskeletal:        General: No deformity. Normal range of motion.     Cervical back: Normal range of motion and neck supple.  Skin:    General: Skin is warm and dry.     Findings: No erythema or rash.  Neurological:     Mental Status: He is alert and oriented to person, place, and time. Mental status is at baseline.     Cranial  Nerves: No cranial nerve deficit.     Coordination: Coordination normal.  Psychiatric:        Mood and Affect: Mood normal.        Latest Ref Rng & Units 04/24/2023    4:36 AM  CMP  Glucose 70 - 99 mg/dL 782   BUN 8 - 23 mg/dL 60   Creatinine 9.56 - 1.24 mg/dL 2.13   Sodium 086 - 578 mmol/L 134   Potassium 3.5 - 5.1 mmol/L 4.2   Chloride 98 - 111 mmol/L 103   CO2 22 - 32 mmol/L 23   Calcium 8.9 - 10.3 mg/dL 8.7       Latest Ref Rng & Units 07/10/2023   12:59 PM  CBC  WBC 4.0 - 10.5 K/uL 10.2   Hemoglobin 13.0 - 17.0 g/dL 46.9   Hematocrit 62.9 -  52.0 % 37.1   Platelets 150 - 400 K/uL 240     No results found.

## 2023-07-16 NOTE — Assessment & Plan Note (Signed)
History of Vitamin B12 deficiency, negative intrinsic factor antibody and antiparital antibody.  B12 level has improved.  Recommend patient to continue oral vitamin B12 supplementation 2 times per week

## 2023-07-16 NOTE — Assessment & Plan Note (Addendum)
#  Anemia in CKD Labs are reviewed and are discussed with patient Lab Results  Component Value Date   HGB 12.5 (L) 07/10/2023   TIBC 329 07/10/2023   IRONPCTSAT 19 07/10/2023   FERRITIN 87 07/10/2023    Ferritin is less than 200 Recommend IV Venofer x 1.  After that recommend patient to continue oral iron supplementation once daily.

## 2023-07-18 DIAGNOSIS — N184 Chronic kidney disease, stage 4 (severe): Secondary | ICD-10-CM | POA: Diagnosis not present

## 2023-07-18 DIAGNOSIS — E782 Mixed hyperlipidemia: Secondary | ICD-10-CM | POA: Diagnosis not present

## 2023-07-18 DIAGNOSIS — I2510 Atherosclerotic heart disease of native coronary artery without angina pectoris: Secondary | ICD-10-CM | POA: Diagnosis not present

## 2023-07-18 DIAGNOSIS — R0789 Other chest pain: Secondary | ICD-10-CM | POA: Diagnosis not present

## 2023-07-18 DIAGNOSIS — I480 Paroxysmal atrial fibrillation: Secondary | ICD-10-CM | POA: Diagnosis not present

## 2023-07-18 DIAGNOSIS — Z8673 Personal history of transient ischemic attack (TIA), and cerebral infarction without residual deficits: Secondary | ICD-10-CM | POA: Diagnosis not present

## 2023-07-18 DIAGNOSIS — I214 Non-ST elevation (NSTEMI) myocardial infarction: Secondary | ICD-10-CM | POA: Diagnosis not present

## 2023-07-18 DIAGNOSIS — I2089 Other forms of angina pectoris: Secondary | ICD-10-CM | POA: Diagnosis not present

## 2023-07-18 DIAGNOSIS — I10 Essential (primary) hypertension: Secondary | ICD-10-CM | POA: Diagnosis not present

## 2023-07-18 DIAGNOSIS — I739 Peripheral vascular disease, unspecified: Secondary | ICD-10-CM | POA: Diagnosis not present

## 2023-07-18 DIAGNOSIS — R0602 Shortness of breath: Secondary | ICD-10-CM | POA: Diagnosis not present

## 2023-07-18 DIAGNOSIS — Z01818 Encounter for other preprocedural examination: Secondary | ICD-10-CM | POA: Diagnosis not present

## 2023-07-23 ENCOUNTER — Ambulatory Visit
Admission: RE | Admit: 2023-07-23 | Discharge: 2023-07-23 | Disposition: A | Discharge status: Home / Self Care | Payer: Medicare Other | Source: Ambulatory Visit | Attending: Internal Medicine | Admitting: Internal Medicine

## 2023-07-23 ENCOUNTER — Ambulatory Visit
Admission: RE | Admit: 2023-07-23 | Discharge: 2023-07-23 | Disposition: A | Discharge status: Home / Self Care | Payer: Medicare Other | Source: Home / Self Care | Attending: Internal Medicine | Admitting: Internal Medicine

## 2023-07-23 DIAGNOSIS — K219 Gastro-esophageal reflux disease without esophagitis: Secondary | ICD-10-CM | POA: Insufficient documentation

## 2023-07-23 DIAGNOSIS — K5790 Diverticulosis of intestine, part unspecified, without perforation or abscess without bleeding: Secondary | ICD-10-CM | POA: Diagnosis not present

## 2023-07-23 DIAGNOSIS — R053 Chronic cough: Secondary | ICD-10-CM | POA: Diagnosis not present

## 2023-07-23 DIAGNOSIS — K224 Dyskinesia of esophagus: Secondary | ICD-10-CM | POA: Diagnosis not present

## 2023-07-25 ENCOUNTER — Telehealth: Payer: Self-pay | Admitting: Internal Medicine

## 2023-07-25 NOTE — Telephone Encounter (Signed)
 SS order faxed to [facility]; [phone]

## 2023-07-30 ENCOUNTER — Ambulatory Visit (INDEPENDENT_AMBULATORY_CARE_PROVIDER_SITE_OTHER): Payer: Medicare Other | Admitting: Internal Medicine

## 2023-07-30 ENCOUNTER — Encounter: Payer: Self-pay | Admitting: Internal Medicine

## 2023-07-30 ENCOUNTER — Other Ambulatory Visit (INDEPENDENT_AMBULATORY_CARE_PROVIDER_SITE_OTHER): Payer: Self-pay | Admitting: Vascular Surgery

## 2023-07-30 VITALS — BP 138/66 | HR 75 | Temp 98.2°F | Resp 16 | Ht 66.0 in | Wt 193.6 lb

## 2023-07-30 DIAGNOSIS — J4520 Mild intermittent asthma, uncomplicated: Secondary | ICD-10-CM

## 2023-07-30 DIAGNOSIS — K219 Gastro-esophageal reflux disease without esophagitis: Secondary | ICD-10-CM | POA: Diagnosis not present

## 2023-07-30 DIAGNOSIS — G4733 Obstructive sleep apnea (adult) (pediatric): Secondary | ICD-10-CM | POA: Diagnosis not present

## 2023-07-30 DIAGNOSIS — I739 Peripheral vascular disease, unspecified: Secondary | ICD-10-CM

## 2023-07-30 DIAGNOSIS — I6523 Occlusion and stenosis of bilateral carotid arteries: Secondary | ICD-10-CM

## 2023-07-30 NOTE — Progress Notes (Signed)
[facility] [address] [place], [place] [address]  Pulmonary Sleep Medicine   Office Visit Note  Patient Name: Mike Decker DOB: Jul 12, 1941 MRN [id number]  Date of Service: 07/30/2023  Complaints/HPI: he states he is still coughing up some dark sputum. Patient states he has had brown sputum. He was in the hospital in July. States it is about the same. He states he can give [place] asample to send to the lab.  Sputum is somewhat thick there is no frank blood in it that he has noticed.  And I do not believe that he has had any recent cultures  Office Spirometry Results:     ROS  General: (-) fever, (-) chills, (-) night sweats, (-) weakness Skin: (-) rashes, (-) itching,. Eyes: (-) visual changes, (-) redness, (-) itching. Nose and Sinuses: (-) nasal stuffiness or itchiness, (-) postnasal drip, (-) nosebleeds, (-) sinus trouble. Mouth and Throat: (-) sore throat, (-) hoarseness. Neck: (-) swollen glands, (-) enlarged thyroid, (-) neck pain. Respiratory: + cough, (-) bloody sputum, + shortness of breath, + wheezing. Cardiovascular: - ankle swelling, (-) chest pain. Lymphatic: (-) lymph node enlargement. Neurologic: (-) numbness, (-) tingling. Psychiatric: (-) anxiety, (-) depression   Current Medication: Outpatient Encounter Medications as of 07/30/2023  Medication Sig Note   acetaminophen (TYLENOL) 500 MG tablet Take 1,000 mg by mouth every 8 (eight) hours as needed for mild pain.    allopurinol (ZYLOPRIM) 100 MG tablet Take 100 mg by mouth daily.    atorvastatin (LIPITOR) 80 MG tablet Take 1 tablet (80 mg total) by mouth daily.    azelastine (ASTELIN) 0.1 % nasal spray USE 1 TO 2 SPRAYS IN EACH NOSTRIL TWICE A DAY    calcitRIOL (ROCALTROL) 0.25 MCG capsule Take 0.25 mcg by mouth daily.    cetirizine (ZYRTEC) 5 MG tablet Take 5 mg by mouth daily.    citalopram (CELEXA) 10 MG tablet Take 1 tablet (10 mg total) by mouth daily.    clopidogrel (PLAVIX) 75 MG  tablet TAKE 1 TABLET BY MOUTH EVERY DAY    colchicine 0.6 MG tablet Take 3 tablets (1.8 mg total) by mouth daily as needed (gout flares).    [employer] VITAMIN B12 1000 MCG tablet TAKE 1 TABLET BY MOUTH EVERY DAY 08/15/2022: Sunday, Tuesday and Thursday only   diltiazem (CARDIZEM CD) 120 MG 24 hr capsule Take 1 capsule (120 mg total) by mouth daily.    EPINEPHrine (EPI-PEN) 0.3 mg/0.3 mL SOAJ injection Inject into the muscle as needed (anaphylaxis).    ergocalciferol (VITAMIN D2) 1.25 MG (50000 UT) capsule Take 50,000 Units by mouth every 30 (thirty) days.    ferrous sulfate 325 (65 FE) MG EC tablet TAKE 1 TABLET BY MOUTH EVERY DAY    fluticasone (FLONASE) 50 MCG/ACT nasal spray USE 1 SPRAY IN EACH NOSTRIL EVERY DAY    folic acid (FOLVITE) 400 MCG tablet Take 400 mcg by mouth daily.    furosemide (LASIX) 20 MG tablet Take 2 tablets (40 mg total) by mouth daily.    hydrALAZINE (APRESOLINE) 50 MG tablet Take 50 mg by mouth 2 (two) times daily.    insulin aspart (NOVOLOG) 100 UNIT/ML FlexPen 10 UNITS IN AM, 30 UNITS AT LUNCH, 30 UNITS AT DINNER    insulin glargine (LANTUS) 100 UNIT/ML injection Inject 0.35 mLs (35 Units total) into the skin at bedtime. This is a decrease from your previous 45 units nightly. (Patient taking differently: Inject 54 Units into the skin at bedtime.)  isosorbide mononitrate (IMDUR) 60 MG 24 hr tablet Take 1 tablet (60 mg total) by mouth daily.    JARDIANCE 10 MG TABS tablet Take 10 mg by mouth daily.    magnesium oxide (MAG-OX) 400 (240 Mg) MG tablet Take 1 tablet by mouth daily.    methocarbamol (ROBAXIN) 500 MG tablet Take 500 mg by mouth at bedtime.    metoprolol succinate (TOPROL-XL) 50 MG 24 hr tablet Take 1 tablet by mouth daily.    mometasone (ASMANEX) 220 MCG/ACT inhaler Inhale 2 puffs into the lungs daily.    montelukast (SINGULAIR) 10 MG tablet TAKE 1 TABLET BY MOUTH EVERYDAY AT BEDTIME    nitroGLYCERIN (NITROSTAT) 0.4 MG SL tablet Place 0.4 mg under the tongue every 5  (five) minutes x 3 doses as needed for chest pain. 04/18/2023: Pt took 3 tablets this morning    pantoprazole (PROTONIX) 40 MG tablet Take 1 tablet (40 mg total) by mouth 2 (two) times daily before a meal.    ranolazine (RANEXA) 500 MG 12 hr tablet Take 1 tablet (500 mg total) by mouth daily.    sodium bicarbonate 650 MG tablet Take 650 mg by mouth 2 (two) times daily.    sodium fluoride (DENTA 5000 PLUS) 1.1 % CREA dental cream See admin instructions.    Tiotropium Bromide-Olodaterol (STIOLTO RESPIMAT) 2.5-2.5 MCG/ACT AERS Inhale 1 each into the lungs daily.    Torsemide 40 MG TABS Take 40 mg by mouth 2 (two) times daily.    No facility-administered encounter medications on file as of 07/30/2023.    Surgical History: Past Surgical History:  Procedure Laterality Date   CATARACT EXTRACTION  2011, 2012   COLONOSCOPY N/A 12/14/2018   Procedure: COLONOSCOPY;  Surgeon: Pasty Spillers, MD;  Location: [facility] ENDOSCOPY;  Service: Endoscopy;  Laterality: N/A;   CORONARY ANGIOPLASTY WITH STENT PLACEMENT  12/10/2010   CORONARY STENT INTERVENTION N/A 12/29/2019   Procedure: CORONARY STENT INTERVENTION;  Surgeon: Yvonne Kendall, MD;  Location: [facility] INVASIVE CV LAB;  Service: Cardiovascular;  Laterality: N/A;   ESOPHAGOGASTRODUODENOSCOPY N/A 12/14/2018   Procedure: ESOPHAGOGASTRODUODENOSCOPY (EGD);  Surgeon: Pasty Spillers, MD;  Location: [facility] ENDOSCOPY;  Service: Endoscopy;  Laterality: N/A;   ESOPHAGOGASTRODUODENOSCOPY (EGD) WITH PROPOFOL N/A 06/01/2016   Procedure: ESOPHAGOGASTRODUODENOSCOPY (EGD) WITH PROPOFOL;  Surgeon: Scot Jun, MD;  Location: [facility] ENDOSCOPY;  Service: Endoscopy;  Laterality: N/A;   ESOPHAGOGASTRODUODENOSCOPY (EGD) WITH PROPOFOL N/A 01/31/2023   Procedure: ESOPHAGOGASTRODUODENOSCOPY (EGD) WITH PROPOFOL;  Surgeon: Midge Minium, MD;  Location: [facility] ENDOSCOPY;  Service: Endoscopy;  Laterality: N/A;   FEMORAL ARTERY STENT Right    HERNIA REPAIR     x2   INTRAOCULAR  LENS INSERTION     LEFT HEART CATH AND CORONARY ANGIOGRAPHY N/A 05/09/2017   Procedure: Left Heart Cath and Coronary Angiography;  Surgeon: Alwyn Pea, MD;  Location: [facility] INVASIVE CV LAB;  Service: Cardiovascular;  Laterality: N/A;   LEFT HEART CATH AND CORONARY ANGIOGRAPHY N/A 12/08/2018   Procedure: LEFT HEART CATH AND CORONARY ANGIOGRAPHY and possible PCI and stent;  Surgeon: Alwyn Pea, MD;  Location: [facility] INVASIVE CV LAB;  Service: Cardiovascular;  Laterality: N/A;   LEFT HEART CATH AND CORONARY ANGIOGRAPHY N/A 12/29/2019   Procedure: LEFT HEART CATH AND CORONARY ANGIOGRAPHY;  Surgeon: Yvonne Kendall, MD;  Location: [facility] INVASIVE CV LAB;  Service: Cardiovascular;  Laterality: N/A;   LEFT HEART CATH AND CORONARY ANGIOGRAPHY Left 12/13/2022   Procedure: LEFT HEART CATH AND CORONARY ANGIOGRAPHY;  Surgeon: Alwyn Pea, MD;  Location: [facility] INVASIVE CV LAB;  Service: Cardiovascular;  Laterality: Left;   NOSE SURGERY     submucous resection   PROSTATE SURGERY  12/10/2000   ROTATOR CUFF REPAIR Right     Medical History: 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, esphageal   Chronic diastolic CHF (congestive heart failure) (HCC)    Colon polyp    Diabetes mellitus without complication (HCC)    Diverticulosis    Gout    Heart disease    Hemangioma    liver   Hyperlipidemia    Hypertension    Myocardial infarct (HCC)    Ocular hypertension    Peripheral vascular disease (HCC)    Skin cancer    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HCC)    Sleep apnea    Vitreoretinal degeneration     Family History: Family History  Problem Relation Age of Onset   Arthritis Mother    Stroke Maternal Grandfather    Breast cancer Neg Hx     Social History: Social History   Socioeconomic History   Marital status: Married    Spouse name: Not on  file   Number of children: 2   Years of education: Automotive engineer 3 years   Highest education level: Associate degree: occupational, Scientist, product/process development, or vocational program  Occupational History   Occupation: retired  Tobacco Use   Smoking status: Former    Current packs/day: 0.00    Types: Cigarettes    Start date: 12/10/1956    Quit date: 12/10/1976    Years since quitting: 46.6   Smokeless tobacco: Never  Vaping Use   Vaping status: Never Used  Substance and Sexual Activity   Alcohol use: No   Drug use: No   Sexual activity: Not Currently  Other Topics Concern   Not on file  Social History Narrative   Not on file   Social Determinants of Health   Financial Resource Strain: Low Risk  (06/21/2023)   Received from [facility]   Overall Financial Resource Strain (CARDIA)    Difficulty of Paying Living Expenses: Not very hard  Food Insecurity: No Food Insecurity (06/21/2023)   Received from [facility]   Hunger Vital Sign    Worried About Running Out of Food in the Last Year: Never true    Ran Out of Food in the Last Year: Never true  Transportation Needs: No Transportation Needs (06/21/2023)   Received from [facility] - Transportation    In the past 12 months, has lack of transportation kept you from medical appointments or from getting medications?: No    Lack of Transportation (Non-Medical): No  Physical Activity: Insufficiently Active (09/15/2021)   Exercise Vital Sign    Days of Exercise per Week: 7 days    Minutes of Exercise per Session: 20 min  Stress: No Stress Concern Present (09/15/2021)   [employer] of Occupational Health - Occupational Stress Questionnaire    Feeling of Stress : Not at all  Social Connections: Moderately Isolated (09/15/2021)   Social Connection and Isolation Panel [NHANES]    Frequency of Communication with Friends and Family: Once a week    Frequency of Social Gatherings with Friends and  Family: Once a week    Attends Religious Services: More than  4 times per year    Active Member of Clubs or Organizations: No    Attends Banker Meetings: Never    Marital Status: Married  Catering manager Violence: Not At Risk (04/18/2023)   Humiliation, Afraid, Rape, and Kick questionnaire    Fear of Current or Ex-Partner: No    Emotionally Abused: No    Physically Abused: No    Sexually Abused: No    Vital Signs: Blood pressure 138/66, pulse 75, temperature 98.2 F (36.8 C), resp. rate 16, height 5\' 6"  (1.676 m), weight 193 lb 9.6 oz (87.8 kg), SpO2 97%.  Examination: General Appearance: The patient is well-developed, well-nourished, and in no distress. Skin: Gross inspection of skin unremarkable. Head: normocephalic, no gross deformities. Eyes: no gross deformities noted. ENT: ears appear grossly normal no exudates. Neck: Supple. No thyromegaly. No LAD. Respiratory: few rhonchi noted. Cardiovascular: Normal S1 and S2 without murmur or rub. Extremities: No cyanosis. pulses are equal. Neurologic: Alert and oriented. No involuntary movements.  LABS: Recent Results (from the past 2160 hour(s))  Pulmonary Function Test     Status: None   Collection Time: 06/10/23  8:37 AM  Result Value Ref Range   FEV1     FVC     FEV1/FVC     TLC     DLCO    Vitamin B12     Status: None   Collection Time: 07/10/23 12:59 PM  Result Value Ref Range   Vitamin B-12 815 180 - 914 pg/mL    Comment: (NOTE) This assay is not validated for testing neonatal or myeloproliferative syndrome specimens for Vitamin B12 levels. Performed at [facility], [address] [place], [place] [address]   Retic Panel     Status: Abnormal   Collection Time: 07/10/23 12:59 PM  Result Value Ref Range   Retic Ct Pct 2.5 0.4 - 3.1 %   RBC. 4.04 (L) 4.22 - 5.81 MIL/uL   Retic Count, Absolute 101.0 19.0 - 186.0 K/uL   Immature Retic Fract 11.6 2.3 - 15.9 %   Reticulocyte Hemoglobin 33.5 >27.9  pg    Comment:        Given the high negative predictive value of a RET-He result > 32 pg iron deficiency is essentially excluded. If this patient is anemic other etiologies should be considered. Performed at [facility], [address] [place], [place] [address]   Ferritin     Status: None   Collection Time: 07/10/23 12:59 PM  Result Value Ref Range   Ferritin 87 24 - 336 ng/mL    Comment: Performed at [facility], [address] [place], [place] [address]  Iron and TIBC([facility])     Status: None   Collection Time: 07/10/23 12:59 PM  Result Value Ref Range   Iron 62 45 - 182 ug/dL   TIBC 269 485 - 462 ug/dL   Saturation Ratios 19 17.9 - 39.5 %   UIBC 267 ug/dL    Comment: Performed at [facility], [address] [place], [place] [address]  CBC with Differential/Platelet     Status: Abnormal   Collection Time: 07/10/23 12:59 PM  Result Value Ref Range   WBC 10.2 4.0 - 10.5 K/uL   RBC 4.02 (L) 4.22 - 5.81 MIL/uL   Hemoglobin 12.5 (L) 13.0 - 17.0 g/dL   HCT 09.3 (L) 81.8 - 29.9 %   MCV 92.3 80.0 - 100.0 fL   MCH 31.1 26.0 - 34.0 pg  MCHC 33.7 30.0 - 36.0 g/dL   RDW 14.7 82.9 - 56.2 %   Platelets 240 150 - 400 K/uL   nRBC 0.0 0.0 - 0.2 %   Neutrophils Relative % 77 %   Neutro Abs 7.8 (H) 1.7 - 7.7 K/uL   Lymphocytes Relative 8 %   Lymphs Abs 0.9 0.7 - 4.0 K/uL   Monocytes Relative 8 %   Monocytes Absolute 0.8 0.1 - 1.0 K/uL   Eosinophils Relative 5 %   Eosinophils Absolute 0.5 0.0 - 0.5 K/uL   Basophils Relative 1 %   Basophils Absolute 0.1 0.0 - 0.1 K/uL   Immature Granulocytes 1 %   Abs Immature Granulocytes 0.14 (H) 0.00 - 0.07 K/uL    Comment: Performed at [facility], [address] [place], [place] [address]    Radiology: DG UGI W DOUBLE CM (HD BA)  Result Date: 07/23/2023 CLINICAL DATA:  Patient with a history of persistent cough, GERD, esophageal cancer, Barrett's esophagus, esophageal dilation and  esophageal ablation. EXAM: UPPER GI SERIES WITH HIGH DENSITY WITHOUT KUB TECHNIQUE: Combined double and single contrast examination was performed using effervescent crystals, high-density barium and thin liquid barium. This exam was performed by Alwyn Ren, NP, and was supervised and interpreted by Dr. Linden Dolin. FLUOROSCOPY: Radiation Exposure Index (as provided by the fluoroscopic device): 85.3 mGy Kerma COMPARISON:  None Available. FINDINGS: Esophagus: Prominent cricopharyngeal bar. Small posterior cervical diverticulum. Smooth narrowing in the lower esophagus. Esophageal motility: Intermittent slow peristalsis. Occasional tertiary contractions. Gastroesophageal reflux: Moderate reflux to the level of the mid-thoracic esophagus. Ingested 13mm barium tablet: Became stuck in the lower esophagus. Stomach: Small to medium sized hiatal hernia. Gastric emptying: Normal. Duodenum: Normal appearance. Other:  None. IMPRESSION: 1. Smooth narrowing in the lower esophagus. 13 mm barium tablet became stuck in the lower esophagus. 2. Mild esophageal dysmotility. 3. Moderate reflux to the level of the midthoracic esophagus. 4. Small posterior cervical diverticulum. 5. Small to medium-sized hiatal hernia. 6. Prominent cricopharyngeal bar. Electronically Signed   By: Duanne Guess D.O.   On: 07/23/2023 10:38    No results found.  DG UGI W DOUBLE CM (HD BA)  Result Date: 07/23/2023 CLINICAL DATA:  Patient with a history of persistent cough, GERD, esophageal cancer, Barrett's esophagus, esophageal dilation and esophageal ablation. EXAM: UPPER GI SERIES WITH HIGH DENSITY WITHOUT KUB TECHNIQUE: Combined double and single contrast examination was performed using effervescent crystals, high-density barium and thin liquid barium. This exam was performed by Alwyn Ren, NP, and was supervised and interpreted by Dr. Linden Dolin. FLUOROSCOPY: Radiation Exposure Index (as provided by the fluoroscopic device): 85.3 mGy Kerma  COMPARISON:  None Available. FINDINGS: Esophagus: Prominent cricopharyngeal bar. Small posterior cervical diverticulum. Smooth narrowing in the lower esophagus. Esophageal motility: Intermittent slow peristalsis. Occasional tertiary contractions. Gastroesophageal reflux: Moderate reflux to the level of the mid-thoracic esophagus. Ingested 13mm barium tablet: Became stuck in the lower esophagus. Stomach: Small to medium sized hiatal hernia. Gastric emptying: Normal. Duodenum: Normal appearance. Other:  None. IMPRESSION: 1. Smooth narrowing in the lower esophagus. 13 mm barium tablet became stuck in the lower esophagus. 2. Mild esophageal dysmotility. 3. Moderate reflux to the level of the midthoracic esophagus. 4. Small posterior cervical diverticulum. 5. Small to medium-sized hiatal hernia. 6. Prominent cricopharyngeal bar. Electronically Signed   By: Duanne Guess D.O.   On: 07/23/2023 10:38    Assessment and Plan: Patient Active Problem List   Diagnosis Date Noted   Obesity (BMI 30-39.9) 04/24/2023  Moderate aortic stenosis 04/24/2023   Paroxysmal atrial fibrillation with RVR (HCC) 04/24/2023   Bronchiectasis (HCC) 04/24/2023   Hemoptysis 04/24/2023   Acute renal failure superimposed on stage 4 chronic kidney disease (HCC) 04/18/2023   Leukocytosis 04/18/2023   Dysphagia 01/31/2023   Vitamin B12 deficiency 12/26/2022   Type II diabetes mellitus with renal manifestations (HCC) 08/15/2022   Depression 08/15/2022   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   Urinary tract infection without hematuria 11/01/2020   Dysuria 11/01/2020   Hyposmolality and/or hyponatremia 09/14/2020   Cough 09/13/2020   Bronchitis 07/15/2020   Palpitations 07/15/2020   Aortic atherosclerosis (HCC) 06/21/2020   Heart failure (HCC) 06/21/2020   Otalgia of both ears 05/27/2020   Lobar pneumonia (HCC)    Type 2 diabetes mellitus with stage 4 chronic kidney disease, without long-term current use of  insulin (HCC) 12/14/2019   Elevated troponin    Acidosis 10/30/2019   Benign 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 10/30/2019   Hyperkalemia 10/30/2019   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/30/2019   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   Encounter for fitting and adjustment of hearing aid 06/16/2019   Health maintenance alteration 06/16/2019   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/16/2019   DM type 2 with diabetic peripheral neuropathy (HCC) 01/25/2019   CKD (chronic kidney disease) stage 4, GFR 15-29 ml/min (HCC) 01/21/2019   Gout 01/21/2019   MR (mitral regurgitation) 01/21/2019   Chronic obstructive pulmonary disease (HCC) 01/08/2019   OSA (obstructive sleep apnea) 01/08/2019   Acute on chronic diastolic CHF (congestive heart failure) (HCC) 12/30/2018   Polyp of sigmoid colon    Benign neoplasm of ascending colon    Hemorrhoids without complication    Diverticulosis of large intestine without diverticulitis    Reflux esophagitis    Lymphangiectasia    Arteriovenous malformation of duodenum    Symptomatic anemia    SOB (shortness of breath)    Iron deficiency anemia due to chronic blood loss    Unstable angina (HCC)    Anemia of chronic renal failure, stage 4 (severe) (HCC)    Chest pain 11/14/2018   Sciatica 11/12/2018   CAD (coronary artery disease) 02/27/2018   Sacroiliac joint pain 10/08/2017   Pain in limb 09/24/2017   Neuropathy 07/24/2017   NSTEMI (non-ST elevated myocardial infarction) (HCC) 05/06/2017   PAD (peripheral artery disease) (HCC) 05/01/2017   History of stroke 01/02/2017   Barrett's esophagus 10/02/2016   Carotid stenosis 10/02/2016   Essential hypertension 10/02/2016   Hyperlipidemia 10/02/2016   Diabetes (HCC) 10/02/2016   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 Chronic asthma, mild intermittent, uncomplicated Under better control has cough and sputum. Will get aputum culture  2. OSA on CPAP He is using his CPAP as prescribed. No issues   3. GERD without esophagitis Significant reflux was noted on the UGI. He is using a wedge now to help with the nocturnal symptoms. He is taking protonix twice daily.   General Counseling: I have discussed the findings of the evaluation and examination with Mike Decker.  I have also discussed any further diagnostic evaluation thatmay be needed or ordered today. Jaman verbalizes understanding of the findings of todays visit. We also reviewed his medications today and discussed drug interactions and side effects including but not limited excessive drowsiness and  altered mental states. We also discussed that there is always a risk not just to him but also people around him. he has been encouraged to call the office with any questions or concerns that should arise related to todays visit.  Orders Placed This Encounter  Procedures   Respiratory or Resp and Sputum Culture     Time spent: 21  I have personally obtained a history, examined the patient, evaluated laboratory and imaging results, formulated the assessment and plan and placed orders.    Yevonne Pax, MD [facility] Pulmonary and Critical Care Sleep medicine

## 2023-07-31 ENCOUNTER — Encounter (INDEPENDENT_AMBULATORY_CARE_PROVIDER_SITE_OTHER): Payer: Self-pay | Admitting: Nurse Practitioner

## 2023-07-31 ENCOUNTER — Ambulatory Visit (INDEPENDENT_AMBULATORY_CARE_PROVIDER_SITE_OTHER): Payer: Medicare Other | Admitting: Nurse Practitioner

## 2023-07-31 ENCOUNTER — Encounter: Payer: Self-pay | Admitting: Oncology

## 2023-07-31 ENCOUNTER — Ambulatory Visit (INDEPENDENT_AMBULATORY_CARE_PROVIDER_SITE_OTHER): Payer: Medicare Other

## 2023-07-31 VITALS — BP 117/64 | HR 78 | Resp 16 | Wt 190.8 lb

## 2023-07-31 DIAGNOSIS — N184 Chronic kidney disease, stage 4 (severe): Secondary | ICD-10-CM | POA: Diagnosis not present

## 2023-07-31 DIAGNOSIS — I739 Peripheral vascular disease, unspecified: Secondary | ICD-10-CM | POA: Diagnosis not present

## 2023-07-31 DIAGNOSIS — I10 Essential (primary) hypertension: Secondary | ICD-10-CM | POA: Diagnosis not present

## 2023-07-31 DIAGNOSIS — I6523 Occlusion and stenosis of bilateral carotid arteries: Secondary | ICD-10-CM

## 2023-07-31 DIAGNOSIS — E1122 Type 2 diabetes mellitus with diabetic chronic kidney disease: Secondary | ICD-10-CM | POA: Diagnosis not present

## 2023-08-01 ENCOUNTER — Encounter (INDEPENDENT_AMBULATORY_CARE_PROVIDER_SITE_OTHER): Payer: Self-pay | Admitting: Nurse Practitioner

## 2023-08-01 NOTE — Progress Notes (Signed)
Subjective:    Patient ID: Mike Decker, male    DOB: 12-15-1940, 82 y.o.   MRN: [id number] Chief Complaint  Patient presents with   Follow-up    Ultrasound follow up    The patient returns to the office for followup and review of the noninvasive studies.  Currently he follows for carotid artery stenosis as well as his peripheral arterial disease.  There have been no interval changes in lower extremity symptoms. No interval shortening of the patient's claudication distance or development of rest pain symptoms. No new ulcers or wounds have occurred since the last visit.  There have been no significant changes to the patient's overall health care.  The patient denies amaurosis fugax or recent TIA symptoms. There are no documented recent neurological changes noted. There is no history of DVT, PE or superficial thrombophlebitis. The patient denies recent episodes of angina or shortness of breath.   ABI Rt=1.25 and Lt=1.20  (previous ABI's Rt=1.20 and Lt=0.97) Duplex ultrasound of the tibial vessels reveals strong biphasic waveforms with good toe waveforms bilaterally.  Today the patient has a 40 to 59% stenosis in the right ICA and  1 to 39% in the left ICA.  This is consistent with results from 2023.    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, esphageal   Chronic diastolic CHF (congestive heart failure) (HCC)    Colon polyp    Diabetes mellitus without complication (HCC)    Diverticulosis    Gout    Heart disease    Hemangioma    liver   Hyperlipidemia    Hypertension    Myocardial infarct (HCC)    Ocular hypertension    Peripheral vascular disease (HCC)     Skin cancer    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HCC)    Sleep apnea    Vitreoretinal degeneration     Social History   Socioeconomic History   Marital status: Married    Spouse name: Not on file   Number of children: 2   Years of education: College 3 years   Highest education level: Associate degree: occupational, Scientist, product/process development, or vocational program  Occupational History   Occupation: retired  Tobacco Use   Smoking status: Former    Current packs/day: 0.00    Types: Cigarettes    Start date: 12/10/1956    Quit date: 12/10/1976    Years since quitting: 46.6   Smokeless tobacco: Never  Vaping Use   Vaping status: Never Used  Substance and Sexual Activity   Alcohol use: No   Drug use: No   Sexual activity: Not Currently  Other Topics Concern   Not on file  Social History Narrative   Not on file   Social Determinants of Health   Financial Resource Strain: Low Risk  (06/21/2023)   Received from [facility]   Overall Financial Resource Strain (CARDIA)    Difficulty of Paying Living Expenses: Not very hard  Food Insecurity: No Food Insecurity (06/21/2023)   Received from [facility]   Hunger  Vital Sign    Worried About Programme researcher, broadcasting/film/video in the Last Year: Never true    Ran Out of Food in the Last Year: Never true  Transportation Needs: No Transportation Needs (06/21/2023)   Received from [facility] - Transportation    In the past 12 months, has lack of transportation kept you from medical appointments or from getting medications?: No    Lack of Transportation (Non-Medical): No  Physical Activity: Insufficiently Active (09/15/2021)   Exercise Vital Sign    Days of Exercise per Week: 7 days    Minutes of Exercise per Session: 20 min  Stress: No Stress Concern Present (09/15/2021)   [employer] of Occupational Health - Occupational Stress Questionnaire    Feeling of Stress : Not at all  Social Connections: Moderately  Isolated (09/15/2021)   Social Connection and Isolation Panel [NHANES]    Frequency of Communication with Friends and Family: Once a week    Frequency of Social Gatherings with Friends and Family: Once a week    Attends Religious Services: More than 4 times per year    Active Member of [employer] or Organizations: No    Attends Banker Meetings: Never    Marital Status: Married  Catering manager Violence: Not At Risk (04/18/2023)   Humiliation, Afraid, Rape, and Kick questionnaire    Fear of Current or Ex-Partner: No    Emotionally Abused: No    Physically Abused: No    Sexually Abused: No    Past Surgical History:  Procedure Laterality Date   CATARACT EXTRACTION  2011, 2012   COLONOSCOPY N/A 12/14/2018   Procedure: COLONOSCOPY;  Surgeon: Pasty Spillers, MD;  Location: [facility] ENDOSCOPY;  Service: Endoscopy;  Laterality: N/A;   CORONARY ANGIOPLASTY WITH STENT PLACEMENT  12/10/2010   CORONARY STENT INTERVENTION N/A 12/29/2019   Procedure: CORONARY STENT INTERVENTION;  Surgeon: Yvonne Kendall, MD;  Location: [facility] INVASIVE CV LAB;  Service: Cardiovascular;  Laterality: N/A;   ESOPHAGOGASTRODUODENOSCOPY N/A 12/14/2018   Procedure: ESOPHAGOGASTRODUODENOSCOPY (EGD);  Surgeon: Pasty Spillers, MD;  Location: [facility] ENDOSCOPY;  Service: Endoscopy;  Laterality: N/A;   ESOPHAGOGASTRODUODENOSCOPY (EGD) WITH PROPOFOL N/A 06/01/2016   Procedure: ESOPHAGOGASTRODUODENOSCOPY (EGD) WITH PROPOFOL;  Surgeon: Scot Jun, MD;  Location: [facility] ENDOSCOPY;  Service: Endoscopy;  Laterality: N/A;   ESOPHAGOGASTRODUODENOSCOPY (EGD) WITH PROPOFOL N/A 01/31/2023   Procedure: ESOPHAGOGASTRODUODENOSCOPY (EGD) WITH PROPOFOL;  Surgeon: Midge Minium, MD;  Location: [facility] ENDOSCOPY;  Service: Endoscopy;  Laterality: N/A;   FEMORAL ARTERY STENT Right    HERNIA REPAIR     x2   INTRAOCULAR LENS INSERTION     LEFT HEART CATH AND CORONARY ANGIOGRAPHY N/A 05/09/2017   Procedure: Left Heart Cath and  Coronary Angiography;  Surgeon: Alwyn Pea, MD;  Location: [facility] INVASIVE CV LAB;  Service: Cardiovascular;  Laterality: N/A;   LEFT HEART CATH AND CORONARY ANGIOGRAPHY N/A 12/08/2018   Procedure: LEFT HEART CATH AND CORONARY ANGIOGRAPHY and possible PCI and stent;  Surgeon: Alwyn Pea, MD;  Location: [facility] INVASIVE CV LAB;  Service: Cardiovascular;  Laterality: N/A;   LEFT HEART CATH AND CORONARY ANGIOGRAPHY N/A 12/29/2019   Procedure: LEFT HEART CATH AND CORONARY ANGIOGRAPHY;  Surgeon: Yvonne Kendall, MD;  Location: [facility] INVASIVE CV LAB;  Service: Cardiovascular;  Laterality: N/A;   LEFT HEART CATH AND CORONARY ANGIOGRAPHY Left 12/13/2022   Procedure: LEFT HEART CATH AND CORONARY ANGIOGRAPHY;  Surgeon: Alwyn Pea, MD;  Location: [facility] INVASIVE CV LAB;  Service: Cardiovascular;  Laterality: Left;   NOSE SURGERY     submucous resection   PROSTATE SURGERY  12/10/2000   ROTATOR CUFF REPAIR Right     Family History  Problem Relation Age of Onset   Arthritis Mother    Stroke Maternal Grandfather    Breast cancer Neg Hx     Allergies  Allergen Reactions   Gabapentin     Other reaction(s): Other (See Comments) Tremors   Peanut-Containing Drug Products Anaphylaxis   Penicillins Hives and Rash    Has patient had a PCN reaction causing immediate rash, facial/tongue/throat swelling, SOB or lightheadedness with hypotension: Yes Has patient had a PCN reaction causing severe rash involving mucus membranes or skin necrosis: No Has patient had a PCN reaction that required hospitalization No Has patient had a PCN reaction occurring within the last 10 years: No If all of the above answers are "NO", then may proceed with Cephalosporin use.   Bee Venom Swelling   Influenza Vaccines Hives   Inh [Isoniazid] Hives   Jardiance [Empagliflozin] Other (See Comments)    Diarrhea   Kenalog [Triamcinolone Acetonide] Hives   Levaquin [Levofloxacin] Other (See Comments)    Tendon,  ligament pain.    Lisinopril     Other reaction(s): Hyperkalemia   Nalfon [Fenoprofen Calcium] Hives   Naproxen    Peanut Oil    Nsaids Rash    Nalfon 600 Nalfon 600       Latest Ref Rng & Units 07/10/2023   12:59 PM 04/24/2023    4:36 AM 04/23/2023    4:15 AM  CBC  WBC 4.0 - 10.5 K/uL 10.2  15.6  15.6   Hemoglobin 13.0 - 17.0 g/dL 29.5  62.1  30.8   Hematocrit 39.0 - 52.0 % 37.1  33.2  34.7   Platelets 150 - 400 K/uL 240  311  317       CMP     Component Value Date/Time   NA 134 (L) 04/24/2023 0436   NA 139 11/03/2014 0711   K 4.2 04/24/2023 0436   K 4.6 11/03/2014 0711   CL 103 04/24/2023 0436   CL 104 11/03/2014 0711   CO2 23 04/24/2023 0436   CO2 28 11/03/2014 0711   GLUCOSE 179 (H) 04/24/2023 0436   GLUCOSE 234 (H) 11/03/2014 0711   BUN 60 (H) 04/24/2023 0436   BUN 43 (H) 11/03/2014 0711   CREATININE 2.43 (H) 04/24/2023 0436   CREATININE 2.35 (H) 06/28/2021 1013   CALCIUM 8.7 (L) 04/24/2023 0436   CALCIUM 8.9 11/03/2014 0711   PROT 6.8 04/19/2023 0134   ALBUMIN 3.8 04/19/2023 0134   AST 39 04/19/2023 0134   ALT 18 04/19/2023 0134   ALKPHOS 51 04/19/2023 0134   BILITOT 1.0 04/19/2023 0134   EGFR 27 (L) 06/28/2021 1013   GFRNONAA 26 (L) 04/24/2023 0436   GFRNONAA 43 (L) 11/03/2014 0711     No results found.     Assessment & Plan:   1. PAD (peripheral artery disease) (HCC)  Recommend:  The patient has evidence of atherosclerosis of the lower extremities with claudication.  The patient does not voice lifestyle limiting changes at this point in time.  Noninvasive studies do not suggest clinically significant change.  No invasive studies, angiography or surgery at this time The patient should continue walking and begin a more formal exercise program.  The patient should continue antiplatelet therapy and aggressive treatment of the lipid abnormalities  No changes in the patient's medications  at this time  Continued surveillance is indicated as  atherosclerosis is likely to progress with time.    The patient will continue follow up with noninvasive studies as ordered.   2. Bilateral carotid artery stenosis Recommend:  Given the patient's asymptomatic subcritical stenosis no further invasive testing or surgery at this time.  Duplex ultrasound shows 40-59% of RICA and 1-39% of LICA stenosis bilaterally.  Continue antiplatelet therapy as prescribed Continue management of CAD, HTN and Hyperlipidemia Healthy heart diet,  encouraged exercise at least 4 times per week Follow up in 12 months with duplex ultrasound and physical exam   3. Essential hypertension Continue antihypertensive medications as already ordered, these medications have been reviewed and there are no changes at this time.  4. Type 2 diabetes mellitus with stage 4 chronic kidney disease, unspecified whether long term insulin use (HCC) Continue hypoglycemic medications as already ordered, these medications have been reviewed and there are no changes at this time.  Hgb A1C to be monitored as already arranged by primary service   Current Outpatient Medications on File Prior to Visit  Medication Sig Dispense Refill   acetaminophen (TYLENOL) 500 MG tablet Take 1,000 mg by mouth every 8 (eight) hours as needed for mild pain.     allopurinol (ZYLOPRIM) 100 MG tablet Take 100 mg by mouth daily.     atorvastatin (LIPITOR) 80 MG tablet Take 1 tablet (80 mg total) by mouth daily. 30 tablet 0   azelastine (ASTELIN) 0.1 % nasal spray USE 1 TO 2 SPRAYS IN EACH NOSTRIL TWICE A DAY     calcitRIOL (ROCALTROL) 0.25 MCG capsule Take 0.25 mcg by mouth daily.     cetirizine (ZYRTEC) 5 MG tablet Take 5 mg by mouth daily.     citalopram (CELEXA) 10 MG tablet Take 1 tablet (10 mg total) by mouth daily. 90 tablet 3   clopidogrel (PLAVIX) 75 MG tablet TAKE 1 TABLET BY MOUTH EVERY DAY 90 tablet 1   colchicine 0.6 MG tablet Take 3 tablets (1.8 mg total) by mouth daily as needed (gout  flares).     [employer] VITAMIN B12 1000 MCG tablet TAKE 1 TABLET BY MOUTH EVERY DAY 30 tablet 0   diltiazem (CARDIZEM CD) 120 MG 24 hr capsule Take 1 capsule (120 mg total) by mouth daily. 30 capsule 0   EPINEPHrine (EPI-PEN) 0.3 mg/0.3 mL SOAJ injection Inject into the muscle as needed (anaphylaxis).     ergocalciferol (VITAMIN D2) 1.25 MG (50000 UT) capsule Take 50,000 Units by mouth every 30 (thirty) days.     ferrous sulfate 325 (65 FE) MG EC tablet TAKE 1 TABLET BY MOUTH EVERY DAY 90 tablet 3   fluticasone (FLONASE) 50 MCG/ACT nasal spray USE 1 SPRAY IN EACH NOSTRIL EVERY DAY 32 g 6   folic acid (FOLVITE) 400 MCG tablet Take 400 mcg by mouth daily.     hydrALAZINE (APRESOLINE) 50 MG tablet Take 50 mg by mouth 2 (two) times daily.     insulin aspart (NOVOLOG) 100 UNIT/ML FlexPen 10 UNITS IN AM, 30 UNITS AT LUNCH, 30 UNITS AT DINNER     insulin glargine (LANTUS) 100 UNIT/ML injection Inject 0.35 mLs (35 Units total) into the skin at bedtime. This is a decrease from your previous 45 units nightly. (Patient taking differently: Inject 54 Units into the skin at bedtime.) 10 mL 11   isosorbide mononitrate (IMDUR) 60 MG 24 hr tablet Take 1 tablet (60 mg total) by mouth daily. 30 tablet 0  JARDIANCE 10 MG TABS tablet Take 10 mg by mouth daily.     magnesium oxide (MAG-OX) 400 (240 Mg) MG tablet Take 1 tablet by mouth daily.     methocarbamol (ROBAXIN) 500 MG tablet Take 500 mg by mouth at bedtime.  0   metoprolol succinate (TOPROL-XL) 50 MG 24 hr tablet Take 1 tablet by mouth daily.     mometasone (ASMANEX) 220 MCG/ACT inhaler Inhale 2 puffs into the lungs daily.     montelukast (SINGULAIR) 10 MG tablet TAKE 1 TABLET BY MOUTH EVERYDAY AT BEDTIME 90 tablet 1   nitroGLYCERIN (NITROSTAT) 0.4 MG SL tablet Place 0.4 mg under the tongue every 5 (five) minutes x 3 doses as needed for chest pain.     pantoprazole (PROTONIX) 40 MG tablet Take 1 tablet (40 mg total) by mouth 2 (two) times daily before a meal.      ranolazine (RANEXA) 500 MG 12 hr tablet Take 1 tablet (500 mg total) by mouth daily. 30 tablet 0   sodium bicarbonate 650 MG tablet Take 650 mg by mouth 2 (two) times daily.     sodium fluoride (DENTA 5000 PLUS) 1.1 % CREA dental cream See admin instructions.     Tiotropium Bromide-Olodaterol (STIOLTO RESPIMAT) 2.5-2.5 MCG/ACT AERS Inhale 1 each into the lungs daily.     Torsemide 40 MG TABS Take 40 mg by mouth 2 (two) times daily.     furosemide (LASIX) 20 MG tablet Take 2 tablets (40 mg total) by mouth daily. (Patient not taking: Reported on 07/31/2023) 30 tablet 0   No current facility-administered medications on file prior to visit.    There are no Patient Instructions on file for this visit. No follow-ups on file.   Georgiana Spinner, NP

## 2023-08-02 LAB — VAS US ABI WITH/WO TBI
Left ABI: 1.2
Right ABI: 1.25

## 2023-08-07 ENCOUNTER — Telehealth: Payer: Self-pay | Admitting: Internal Medicine

## 2023-08-07 DIAGNOSIS — E119 Type 2 diabetes mellitus without complications: Secondary | ICD-10-CM | POA: Diagnosis not present

## 2023-08-07 DIAGNOSIS — Z961 Presence of intraocular lens: Secondary | ICD-10-CM | POA: Diagnosis not present

## 2023-08-07 NOTE — Telephone Encounter (Signed)
SS appointment 08/27/2023 @ [facility]

## 2023-08-08 ENCOUNTER — Encounter: Payer: Self-pay | Admitting: *Deleted

## 2023-08-08 ENCOUNTER — Encounter: Payer: Medicare Other | Attending: Internal Medicine | Admitting: *Deleted

## 2023-08-08 DIAGNOSIS — I214 Non-ST elevation (NSTEMI) myocardial infarction: Secondary | ICD-10-CM

## 2023-08-08 NOTE — Progress Notes (Signed)
Initial phone call completed. Dx can be found in [facility] 7/12. EP orientation scheduled for 9/11 at 1:30 pm.

## 2023-08-13 ENCOUNTER — Ambulatory Visit: Payer: Medicare Other | Admitting: Internal Medicine

## 2023-08-14 DIAGNOSIS — E8722 Chronic metabolic acidosis: Secondary | ICD-10-CM | POA: Diagnosis not present

## 2023-08-14 DIAGNOSIS — E875 Hyperkalemia: Secondary | ICD-10-CM | POA: Diagnosis not present

## 2023-08-14 DIAGNOSIS — E1122 Type 2 diabetes mellitus with diabetic chronic kidney disease: Secondary | ICD-10-CM | POA: Diagnosis not present

## 2023-08-14 DIAGNOSIS — N184 Chronic kidney disease, stage 4 (severe): Secondary | ICD-10-CM | POA: Diagnosis not present

## 2023-08-14 DIAGNOSIS — D631 Anemia in chronic kidney disease: Secondary | ICD-10-CM | POA: Diagnosis not present

## 2023-08-14 DIAGNOSIS — I10 Essential (primary) hypertension: Secondary | ICD-10-CM | POA: Diagnosis not present

## 2023-08-14 DIAGNOSIS — E871 Hypo-osmolality and hyponatremia: Secondary | ICD-10-CM | POA: Diagnosis not present

## 2023-08-14 DIAGNOSIS — N2581 Secondary hyperparathyroidism of renal origin: Secondary | ICD-10-CM | POA: Diagnosis not present

## 2023-08-14 DIAGNOSIS — I129 Hypertensive chronic kidney disease with stage 1 through stage 4 chronic kidney disease, or unspecified chronic kidney disease: Secondary | ICD-10-CM | POA: Diagnosis not present

## 2023-08-19 DIAGNOSIS — E1122 Type 2 diabetes mellitus with diabetic chronic kidney disease: Secondary | ICD-10-CM | POA: Diagnosis not present

## 2023-08-19 DIAGNOSIS — N189 Chronic kidney disease, unspecified: Secondary | ICD-10-CM | POA: Diagnosis not present

## 2023-08-19 DIAGNOSIS — Z794 Long term (current) use of insulin: Secondary | ICD-10-CM | POA: Diagnosis not present

## 2023-08-19 DIAGNOSIS — N179 Acute kidney failure, unspecified: Secondary | ICD-10-CM | POA: Diagnosis not present

## 2023-08-19 DIAGNOSIS — N184 Chronic kidney disease, stage 4 (severe): Secondary | ICD-10-CM | POA: Diagnosis not present

## 2023-08-19 DIAGNOSIS — I2510 Atherosclerotic heart disease of native coronary artery without angina pectoris: Secondary | ICD-10-CM | POA: Diagnosis not present

## 2023-08-19 DIAGNOSIS — I5033 Acute on chronic diastolic (congestive) heart failure: Secondary | ICD-10-CM | POA: Diagnosis not present

## 2023-08-19 DIAGNOSIS — K2270 Barrett's esophagus without dysplasia: Secondary | ICD-10-CM | POA: Diagnosis not present

## 2023-08-20 DIAGNOSIS — E875 Hyperkalemia: Secondary | ICD-10-CM | POA: Diagnosis not present

## 2023-08-20 DIAGNOSIS — N2581 Secondary hyperparathyroidism of renal origin: Secondary | ICD-10-CM | POA: Diagnosis not present

## 2023-08-20 DIAGNOSIS — I129 Hypertensive chronic kidney disease with stage 1 through stage 4 chronic kidney disease, or unspecified chronic kidney disease: Secondary | ICD-10-CM | POA: Diagnosis not present

## 2023-08-20 DIAGNOSIS — D631 Anemia in chronic kidney disease: Secondary | ICD-10-CM | POA: Diagnosis not present

## 2023-08-20 DIAGNOSIS — E1122 Type 2 diabetes mellitus with diabetic chronic kidney disease: Secondary | ICD-10-CM | POA: Diagnosis not present

## 2023-08-20 DIAGNOSIS — N184 Chronic kidney disease, stage 4 (severe): Secondary | ICD-10-CM | POA: Diagnosis not present

## 2023-08-21 ENCOUNTER — Encounter: Payer: Medicare Other | Attending: Internal Medicine

## 2023-08-21 VITALS — Ht 66.2 in | Wt 188.6 lb

## 2023-08-21 DIAGNOSIS — I214 Non-ST elevation (NSTEMI) myocardial infarction: Secondary | ICD-10-CM

## 2023-08-21 DIAGNOSIS — I252 Old myocardial infarction: Secondary | ICD-10-CM | POA: Insufficient documentation

## 2023-08-21 DIAGNOSIS — I2089 Other forms of angina pectoris: Secondary | ICD-10-CM | POA: Diagnosis not present

## 2023-08-21 DIAGNOSIS — Z48812 Encounter for surgical aftercare following surgery on the circulatory system: Secondary | ICD-10-CM | POA: Diagnosis not present

## 2023-08-21 NOTE — Patient Instructions (Signed)
Patient Instructions  Patient Details  Name: Mike Decker MRN: [id number] Date of Birth: [birth year] Referring Provider:  Alwyn Pea, MD  Below are your personal goals for exercise, nutrition, and risk factors. Our goal is to help you stay on track towards obtaining and maintaining these goals. We will be discussing your progress on these goals with you throughout the program.  Initial Exercise Prescription:  Initial Exercise Prescription - 08/21/23 1500       Date of Initial Exercise RX and Referring Provider   Date 08/21/23    Referring Provider [place]      Oxygen   Maintain Oxygen Saturation 88% or higher      Treadmill   MPH 1.4    Grade 0    Minutes 15    METs 2.07      Recumbant Bike   Level 1    RPM 50    Watts 15    Minutes 15    METs 1.43      NuStep   Level 1   T4 and T6   SPM 80    Minutes 15    METs 1.43      T5 Nustep   Level 1    SPM 80    Minutes 15    METs 1.43      Prescription Details   Frequency (times per week) 3    Duration Progress to 30 minutes of continuous aerobic without signs/symptoms of physical distress      Intensity   THRR 40-80% of Max Heartrate 96-123    Ratings of Perceived Exertion 11-13    Perceived Dyspnea 0-4      Progression   Progression Continue to progress workloads to maintain intensity without signs/symptoms of physical distress.      Resistance Training   Training Prescription Yes    Weight 4 lb    Reps 10-15             Exercise Goals: Frequency: Be able to perform aerobic exercise two to three times per week in program working toward 2-5 days per week of home exercise.  Intensity: Work with a perceived exertion of 11 (fairly light) - 15 (hard) while following your exercise prescription.  We will make changes to your prescription with you as you progress through the program.   Duration: Be able to do 30 to 45 minutes of continuous aerobic exercise in addition to a 5 minute warm-up  and a 5 minute cool-down routine.   Nutrition Goals: Your personal nutrition goals will be established when you do your nutrition analysis with the dietician.  The following are general nutrition guidelines to follow: Cholesterol < 200mg /day Sodium < 1500mg /day Fiber: Men over 50 yrs - 30 grams per day  Personal Goals:  Personal Goals and Risk Factors at Admission - 08/21/23 1603       Core Components/Risk Factors/Patient Goals on Admission    Weight Management Yes;Weight Loss    Intervention Weight Management: Develop a combined nutrition and exercise program designed to reach desired caloric intake, while maintaining appropriate intake of nutrient and fiber, sodium and fats, and appropriate energy expenditure required for the weight goal.;Weight Management: Provide education and appropriate resources to help participant work on and attain dietary goals.;Weight Management/Obesity: Establish reasonable short term and long term weight goals.    Admit Weight 188 lb 9.6 oz (85.5 kg)    Goal Weight: Short Term 178 lb (80.7 kg)    Goal Weight: Long Term 173  lb (78.5 kg)    Expected Outcomes Short Term: Continue to assess and modify interventions until short term weight is achieved;Long Term: Adherence to nutrition and physical activity/exercise program aimed toward attainment of established weight goal;Weight Maintenance: Understanding of the daily nutrition guidelines, which includes 25-35% calories from fat, 7% or less cal from saturated fats, less than 200mg  cholesterol, less than 1.5gm of sodium, & 5 or more servings of fruits and vegetables daily;Understanding recommendations for meals to include 15-35% energy as protein, 25-35% energy from fat, 35-60% energy from carbohydrates, less than 200mg  of dietary cholesterol, 20-35 gm of total fiber daily;Weight Loss: Understanding of general recommendations for a balanced deficit meal plan, which promotes 1-2 lb weight loss per week and includes a  negative energy balance of [id number] kcal/d;Understanding of distribution of calorie intake throughout the day with the consumption of 4-5 meals/snacks    Improve shortness of breath with ADL's Yes    Intervention Provide education, individualized exercise plan and daily activity instruction to help decrease symptoms of SOB with activities of daily living.    Expected Outcomes Short Term: Improve cardiorespiratory fitness to achieve a reduction of symptoms when performing ADLs;Long Term: Be able to perform more ADLs without symptoms or delay the onset of symptoms    Diabetes Yes    Intervention Provide education about signs/symptoms and action to take for hypo/hyperglycemia.;Provide education about proper nutrition, including hydration, and aerobic/resistive exercise prescription along with prescribed medications to achieve blood glucose in normal ranges: Fasting glucose 65-99 mg/dL    Expected Outcomes Short Term: Participant verbalizes understanding of the signs/symptoms and immediate care of hyper/hypoglycemia, proper foot care and importance of medication, aerobic/resistive exercise and nutrition plan for blood glucose control.;Long Term: Attainment of HbA1C < 7%.    Heart Failure Yes    Intervention Provide a combined exercise and nutrition program that is supplemented with education, support and counseling about heart failure. Directed toward relieving symptoms such as shortness of breath, decreased exercise tolerance, and extremity edema.    Expected Outcomes Improve functional capacity of life;Short term: Attendance in program 2-3 days a week with increased exercise capacity. Reported lower sodium intake. Reported increased fruit and vegetable intake. Reports medication compliance.;Short term: Daily weights obtained and reported for increase. Utilizing diuretic protocols set by physician.;Long term: Adoption of self-care skills and reduction of barriers for early signs and symptoms recognition and  intervention leading to self-care maintenance.    Hypertension Yes    Intervention Provide education on lifestyle modifcations including regular physical activity/exercise, weight management, moderate sodium restriction and increased consumption of fresh fruit, vegetables, and low fat dairy, alcohol moderation, and smoking cessation.;Monitor prescription use compliance.    Expected Outcomes Short Term: Continued assessment and intervention until BP is < 140/84mm HG in hypertensive participants. < 130/22mm HG in hypertensive participants with diabetes, heart failure or chronic kidney disease.;Long Term: Maintenance of blood pressure at goal levels.    Lipids Yes    Intervention Provide education and support for participant on nutrition & aerobic/resistive exercise along with prescribed medications to achieve LDL 70mg , HDL >40mg .    Expected Outcomes Short Term: Participant states understanding of desired cholesterol values and is compliant with medications prescribed. Participant is following exercise prescription and nutrition guidelines.;Long Term: Cholesterol controlled with medications as prescribed, with individualized exercise RX and with personalized nutrition plan. Value goals: LDL < 70mg , HDL > 40 mg.             Tobacco Use Initial Evaluation: Social History  Tobacco Use  Smoking Status Former   Current packs/day: 0.00   Types: Cigarettes   Start date: 12/10/1956   Quit date: 12/10/1976   Years since quitting: 46.7  Smokeless Tobacco Never    Exercise Goals and Review:  Exercise Goals     Row Name 08/21/23 1556             Exercise Goals   Increase Physical Activity Yes       Intervention Provide advice, education, support and counseling about physical activity/exercise needs.;Develop an individualized exercise prescription for aerobic and resistive training based on initial evaluation findings, risk stratification, comorbidities and participant's personal goals.        Expected Outcomes Short Term: Attend rehab on a regular basis to increase amount of physical activity.;Long Term: Add in home exercise to make exercise part of routine and to increase amount of physical activity.;Long Term: Exercising regularly at least 3-5 days a week.       Increase Strength and Stamina Yes       Intervention Provide advice, education, support and counseling about physical activity/exercise needs.;Develop an individualized exercise prescription for aerobic and resistive training based on initial evaluation findings, risk stratification, comorbidities and participant's personal goals.       Expected Outcomes Short Term: Increase workloads from initial exercise prescription for resistance, speed, and METs.;Short Term: Perform resistance training exercises routinely during rehab and add in resistance training at home;Long Term: Improve cardiorespiratory fitness, muscular endurance and strength as measured by increased METs and functional capacity ( )       Able to understand and use rate of perceived exertion (RPE) scale Yes       Intervention Provide education and explanation on how to use RPE scale       Expected Outcomes Short Term: Able to use RPE daily in rehab to express subjective intensity level;Long Term:  Able to use RPE to guide intensity level when exercising independently       Able to understand and use Dyspnea scale Yes       Intervention Provide education and explanation on how to use Dyspnea scale       Expected Outcomes Short Term: Able to use Dyspnea scale daily in rehab to express subjective sense of shortness of breath during exertion;Long Term: Able to use Dyspnea scale to guide intensity level when exercising independently       Knowledge and understanding of Target Heart Rate Range (THRR) Yes       Intervention Provide education and explanation of THRR including how the numbers were predicted and where they are located for reference       Expected Outcomes Short  Term: Able to state/look up THRR;Short Term: Able to use daily as guideline for intensity in rehab;Long Term: Able to use THRR to govern intensity when exercising independently       Able to check pulse independently Yes       Intervention Provide education and demonstration on how to check pulse in carotid and radial arteries.;Review the importance of being able to check your own pulse for safety during independent exercise       Expected Outcomes Short Term: Able to explain why pulse checking is important during independent exercise;Long Term: Able to check pulse independently and accurately       Understanding of Exercise Prescription Yes       Intervention Provide education, explanation, and written materials on patient's individual exercise prescription       Expected Outcomes Short  Term: Able to explain program exercise prescription;Long Term: Able to explain home exercise prescription to exercise independently

## 2023-08-21 NOTE — Progress Notes (Signed)
Cardiac Individual Treatment Plan  Patient Details  Name: Mike Decker MRN: [id number] Date of Birth: 09-Oct-1941 Referring Provider:   Flowsheet Row Cardiac Rehab from 08/21/2023 in [facility] Cardiac and Pulmonary Rehab  Referring Provider [facility]       Initial Encounter Date:  Flowsheet Row Cardiac Rehab from 08/21/2023 in [facility] Cardiac and Pulmonary Rehab  Date 08/21/23       Visit Diagnosis: NSTEMI (non-ST elevation myocardial infarction) [facility])  Patient's Home Medications on Admission:  Current Outpatient Medications:    acetaminophen (TYLENOL) 500 MG tablet, Take 1,000 mg by mouth every 8 (eight) hours as needed for mild pain., Disp: , Rfl:    allopurinol (ZYLOPRIM) 100 MG tablet, Take 100 mg by mouth daily., Disp: , Rfl:    atorvastatin (LIPITOR) 80 MG tablet, Take 1 tablet (80 mg total) by mouth daily., Disp: 30 tablet, Rfl: 0   azelastine (ASTELIN) 0.1 % nasal spray, USE 1 TO 2 SPRAYS IN EACH NOSTRIL TWICE A DAY, Disp: , Rfl:    calcitRIOL (ROCALTROL) 0.25 MCG capsule, Take 0.25 mcg by mouth daily., Disp: , Rfl:    cetirizine (ZYRTEC) 5 MG tablet, Take 5 mg by mouth daily., Disp: , Rfl:    citalopram (CELEXA) 10 MG tablet, Take 1 tablet (10 mg total) by mouth daily., Disp: 90 tablet, Rfl: 3   clopidogrel (PLAVIX) 75 MG tablet, TAKE 1 TABLET BY MOUTH EVERY DAY, Disp: 90 tablet, Rfl: 1   colchicine 0.6 MG tablet, Take 3 tablets (1.8 mg total) by mouth daily as needed (gout flares)., Disp: , Rfl:    [employer] VITAMIN B12 1000 MCG tablet, TAKE 1 TABLET BY MOUTH EVERY DAY, Disp: 30 tablet, Rfl: 0   diltiazem (CARDIZEM CD) 120 MG 24 hr capsule, Take 1 capsule (120 mg total) by mouth daily., Disp: 30 capsule, Rfl: 0   EPINEPHrine (EPI-PEN) 0.3 mg/0.3 mL SOAJ injection, Inject into the muscle as needed (anaphylaxis)., Disp: , Rfl:    ergocalciferol (VITAMIN D2) 1.25 MG (50000 UT) capsule, Take 50,000 Units by mouth every 30 (thirty) days., Disp: , Rfl:    ferrous sulfate 325 (65 FE) MG  EC tablet, TAKE 1 TABLET BY MOUTH EVERY DAY, Disp: 90 tablet, Rfl: 3   fluticasone (FLONASE) 50 MCG/ACT nasal spray, USE 1 SPRAY IN EACH NOSTRIL EVERY DAY, Disp: 32 g, Rfl: 6   folic acid (FOLVITE) 400 MCG tablet, Take 400 mcg by mouth daily., Disp: , Rfl:    hydrALAZINE (APRESOLINE) 50 MG tablet, Take 50 mg by mouth 2 (two) times daily., Disp: , Rfl:    insulin aspart (NOVOLOG) 100 UNIT/ML FlexPen, 10 UNITS IN AM, 30 UNITS AT LUNCH, 30 UNITS AT DINNER, Disp: , Rfl:    insulin glargine (LANTUS) 100 UNIT/ML injection, Inject 0.35 mLs (35 Units total) into the skin at bedtime. This is a decrease from your previous 45 units nightly. (Patient taking differently: Inject 54 Units into the skin at bedtime.), Disp: 10 mL, Rfl: 11   isosorbide mononitrate (IMDUR) 60 MG 24 hr tablet, Take 1 tablet (60 mg total) by mouth daily., Disp: 30 tablet, Rfl: 0   JARDIANCE 10 MG TABS tablet, Take 10 mg by mouth daily., Disp: , Rfl:    magnesium oxide (MAG-OX) 400 (240 Mg) MG tablet, Take 1 tablet by mouth daily., Disp: , Rfl:    methocarbamol (ROBAXIN) 500 MG tablet, Take 500 mg by mouth at bedtime., Disp: , Rfl: 0   metoprolol succinate (TOPROL-XL) 50 MG 24 hr tablet, Take 1  tablet by mouth daily., Disp: , Rfl:    mometasone (ASMANEX) 220 MCG/ACT inhaler, Inhale 2 puffs into the lungs daily., Disp: , Rfl:    montelukast (SINGULAIR) 10 MG tablet, TAKE 1 TABLET BY MOUTH EVERYDAY AT BEDTIME, Disp: 90 tablet, Rfl: 1   nitroGLYCERIN (NITROSTAT) 0.4 MG SL tablet, Place 0.4 mg under the tongue every 5 (five) minutes x 3 doses as needed for chest pain., Disp: , Rfl:    pantoprazole (PROTONIX) 40 MG tablet, Take 1 tablet (40 mg total) by mouth 2 (two) times daily before a meal., Disp: , Rfl:    ranolazine (RANEXA) 500 MG 12 hr tablet, Take 1 tablet (500 mg total) by mouth daily., Disp: 30 tablet, Rfl: 0   sodium bicarbonate 650 MG tablet, Take 650 mg by mouth 2 (two) times daily., Disp: , Rfl:    sodium fluoride (DENTA 5000  PLUS) 1.1 % CREA dental cream, See admin instructions., Disp: , Rfl:    Tiotropium Bromide-Olodaterol (STIOLTO RESPIMAT) 2.5-2.5 MCG/ACT AERS, Inhale 1 each into the lungs daily., Disp: , Rfl:    Torsemide 40 MG TABS, Take 40 mg by mouth 2 (two) times daily., Disp: , Rfl:   Past Medical History: 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, esphageal   Chronic diastolic CHF (congestive heart failure) (HCC)    Colon polyp    Diabetes mellitus without complication (HCC)    Diverticulosis    Gout    Heart disease    Hemangioma    liver   Hyperlipidemia    Hypertension    Myocardial infarct (HCC)    Ocular hypertension    Peripheral vascular disease (HCC)    Skin cancer    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HCC)    Sleep apnea    Vitreoretinal degeneration     Tobacco Use: Social History   Tobacco Use  Smoking Status Former   Current packs/day: 0.00   Types: Cigarettes   Start date: 12/10/1956   Quit date: 12/10/1976   Years since quitting: 46.7  Smokeless Tobacco Never    Labs: Review Flowsheet  More data exists      Latest Ref Rng & Units 12/29/2019 07/18/2021 08/15/2022 08/16/2022 04/19/2023  Labs for ITP Cardiac and Pulmonary Rehab  Cholestrol 0 - 200 mg/dL 295  - - 621  -  LDL (calc) 0 - 99 mg/dL 51  - - 33  -  HDL-C >30 mg/dL 31  - - 17  -  Trlycerides <150 mg/dL 865  - - 784  -  Hemoglobin A1c 4.8 - 5.6 % - 8.3     7.5  - 6.2     Details       This result is from an external source.          Exercise Target Goals: Exercise Program Goal: Individual exercise prescription set using results from initial 6 min walk test and THRR while considering  patient's activity barriers and safety.   Exercise Prescription Goal: Initial exercise prescription builds to 30-45 minutes a day of aerobic activity, 2-3 days per week.  Home exercise  guidelines will be given to patient during program as part of exercise prescription that the participant will acknowledge.   Education: Aerobic Exercise: - Group verbal and visual presentation on the components of exercise prescription. Introduces  F.I.T.T principle from ACSM for exercise prescriptions.  Reviews F.I.T.T. principles of aerobic exercise including progression. Written material given at graduation. Flowsheet Row Cardiac Rehab from 08/21/2023 in [facility] Cardiac and Pulmonary Rehab  Education need identified 08/21/23       Education: Resistance Exercise: - Group verbal and visual presentation on the components of exercise prescription. Introduces F.I.T.T principle from ACSM for exercise prescriptions  Reviews F.I.T.T. principles of resistance exercise including progression. Written material given at graduation.    Education: Exercise & Equipment Safety: - Individual verbal instruction and demonstration of equipment use and safety with use of the equipment. Flowsheet Row Cardiac Rehab from 08/21/2023 in [facility] Cardiac and Pulmonary Rehab  Date 08/21/23  Educator MB  Instruction Review Code 1- Verbalizes Understanding       Education: Exercise Physiology & General Exercise Guidelines: - Group verbal and written instruction with models to review the exercise physiology of the cardiovascular system and associated critical values. Provides general exercise guidelines with specific guidelines to those with heart or lung disease.    Education: Flexibility, Balance, Mind/Body Relaxation: - Group verbal and visual presentation with interactive activity on the components of exercise prescription. Introduces F.I.T.T principle from ACSM for exercise prescriptions. Reviews F.I.T.T. principles of flexibility and balance exercise training including progression. Also discusses the mind body connection.  Reviews various relaxation techniques to help reduce and manage stress (i.e. Deep breathing,  progressive muscle relaxation, and visualization). Balance handout provided to take home. Written material given at graduation.   Activity Barriers & Risk Stratification:  Activity Barriers & Cardiac Risk Stratification - 08/21/23 1552       Activity Barriers & Cardiac Risk Stratification   Activity Barriers Back Problems;Other (comment);Muscular Weakness    Comments Gout flares occasionally; back problems, but tolerates    Cardiac Risk Stratification High             6 Minute Walk:  6 Minute Walk     Row Name 08/21/23 1551         6 Minute Walk   Phase Initial     Distance 890 feet     Walk Time 6 minutes     # of Rest Breaks 0     MPH 1.69     METS 1.43     RPE 13     Perceived Dyspnea  1     VO2 Peak 4.99     Symptoms Yes (comment)     Comments Fatigue in legs     Resting HR 69 bpm     Resting BP 140/70     Resting Oxygen Saturation  97 %     Exercise Oxygen Saturation  during 6 min walk 99 %     Max Ex. HR 104 bpm     Max Ex. BP 142/58     2 Minute Post BP 140/66              Oxygen Initial Assessment:   Oxygen Re-Evaluation:   Oxygen Discharge (Final Oxygen Re-Evaluation):   Initial Exercise Prescription:  Initial Exercise Prescription - 08/21/23 1500       Date of Initial Exercise RX and Referring Provider   Date 08/21/23    Referring Provider [facility]      Oxygen   Maintain Oxygen Saturation 88% or higher      Treadmill   MPH 1.4    Grade 0    Minutes 15    METs 2.07      Recumbant Bike  Level 1    RPM 50    Watts 15    Minutes 15    METs 1.43      NuStep   Level 1   T4 and T6   SPM 80    Minutes 15    METs 1.43      T5 Nustep   Level 1    SPM 80    Minutes 15    METs 1.43      Prescription Details   Frequency (times per week) 3    Duration Progress to 30 minutes of continuous aerobic without signs/symptoms of physical distress      Intensity   THRR 40-80% of Max Heartrate 96-123    Ratings of Perceived  Exertion 11-13    Perceived Dyspnea 0-4      Progression   Progression Continue to progress workloads to maintain intensity without signs/symptoms of physical distress.      Resistance Training   Training Prescription Yes    Weight 4 lb    Reps 10-15             Perform Capillary Blood Glucose checks as needed.  Exercise Prescription Changes:   Exercise Prescription Changes     Row Name 08/21/23 1500             Response to Exercise   Blood Pressure (Admit) 140/70       Blood Pressure (Exercise) 142/58       Blood Pressure (Exit) 140/66       Heart Rate (Admit) 69 bpm       Heart Rate (Exercise) 104 bpm       Heart Rate (Exit) 72 bpm       Oxygen Saturation (Admit) 97 %       Oxygen Saturation (Exercise) 99 %       Oxygen Saturation (Exit) 97 %       Rating of Perceived Exertion (Exercise) 13       Perceived Dyspnea (Exercise) 1       Symptoms fatigue in legs       Comments results       Duration Progress to 30 minutes of  aerobic without signs/symptoms of physical distress       Intensity THRR New         Progression   Progression Continue to progress workloads to maintain intensity without signs/symptoms of physical distress.       Average METs 1.43                Exercise Comments:   Exercise Goals and Review:   Exercise Goals     Row Name 08/21/23 1556             Exercise Goals   Increase Physical Activity Yes       Intervention Provide advice, education, support and counseling about physical activity/exercise needs.;Develop an individualized exercise prescription for aerobic and resistive training based on initial evaluation findings, risk stratification, comorbidities and participant's personal goals.       Expected Outcomes Short Term: Attend rehab on a regular basis to increase amount of physical activity.;Long Term: Add in home exercise to make exercise part of routine and to increase amount of physical activity.;Long Term:  Exercising regularly at least 3-5 days a week.       Increase Strength and Stamina Yes       Intervention Provide advice, education, support and counseling about physical activity/exercise needs.;Develop an individualized exercise prescription  for aerobic and resistive training based on initial evaluation findings, risk stratification, comorbidities and participant's personal goals.       Expected Outcomes Short Term: Increase workloads from initial exercise prescription for resistance, speed, and METs.;Short Term: Perform resistance training exercises routinely during rehab and add in resistance training at home;Long Term: Improve cardiorespiratory fitness, muscular endurance and strength as measured by increased METs and functional capacity ( )       Able to understand and use rate of perceived exertion (RPE) scale Yes       Intervention Provide education and explanation on how to use RPE scale       Expected Outcomes Short Term: Able to use RPE daily in rehab to express subjective intensity level;Long Term:  Able to use RPE to guide intensity level when exercising independently       Able to understand and use Dyspnea scale Yes       Intervention Provide education and explanation on how to use Dyspnea scale       Expected Outcomes Short Term: Able to use Dyspnea scale daily in rehab to express subjective sense of shortness of breath during exertion;Long Term: Able to use Dyspnea scale to guide intensity level when exercising independently       Knowledge and understanding of Target Heart Rate Range (THRR) Yes       Intervention Provide education and explanation of THRR including how the numbers were predicted and where they are located for reference       Expected Outcomes Short Term: Able to state/look up THRR;Short Term: Able to use daily as guideline for intensity in rehab;Long Term: Able to use THRR to govern intensity when exercising independently       Able to check pulse independently Yes        Intervention Provide education and demonstration on how to check pulse in carotid and radial arteries.;Review the importance of being able to check your own pulse for safety during independent exercise       Expected Outcomes Short Term: Able to explain why pulse checking is important during independent exercise;Long Term: Able to check pulse independently and accurately       Understanding of Exercise Prescription Yes       Intervention Provide education, explanation, and written materials on patient's individual exercise prescription       Expected Outcomes Short Term: Able to explain program exercise prescription;Long Term: Able to explain home exercise prescription to exercise independently                Exercise Goals Re-Evaluation :   Discharge Exercise Prescription (Final Exercise Prescription Changes):  Exercise Prescription Changes - 08/21/23 1500       Response to Exercise   Blood Pressure (Admit) 140/70    Blood Pressure (Exercise) 142/58    Blood Pressure (Exit) 140/66    Heart Rate (Admit) 69 bpm    Heart Rate (Exercise) 104 bpm    Heart Rate (Exit) 72 bpm    Oxygen Saturation (Admit) 97 %    Oxygen Saturation (Exercise) 99 %    Oxygen Saturation (Exit) 97 %    Rating of Perceived Exertion (Exercise) 13    Perceived Dyspnea (Exercise) 1    Symptoms fatigue in legs    Comments results    Duration Progress to 30 minutes of  aerobic without signs/symptoms of physical distress    Intensity THRR New      Progression   Progression Continue to progress workloads  to maintain intensity without signs/symptoms of physical distress.    Average METs 1.43             Nutrition:  Target Goals: Understanding of nutrition guidelines, daily intake of sodium 1500mg , cholesterol 200mg , calories 30% from fat and 7% or less from saturated fats, daily to have 5 or more servings of fruits and vegetables.  Education: All About Nutrition: -Group instruction provided  by verbal, written material, interactive activities, discussions, models, and posters to present general guidelines for heart healthy nutrition including fat, fiber, MyPlate, the role of sodium in heart healthy nutrition, utilization of the nutrition label, and utilization of this knowledge for meal planning. Follow up email sent as well. Written material given at graduation. Flowsheet Row Cardiac Rehab from 08/21/2023 in [facility] Cardiac and Pulmonary Rehab  Education need identified 08/21/23       Biometrics:  Pre Biometrics - 08/21/23 1557       Pre Biometrics   Height 5' 6.2" (1.681 m)    Weight 188 lb 9.6 oz (85.5 kg)    Waist Circumference 45.5 inches    Hip Circumference 46.5 inches    Waist to Hip Ratio 0.98 %    BMI (Calculated) 30.27    Single Leg Stand 4 seconds              Nutrition Therapy Plan and Nutrition Goals:  Nutrition Therapy & Goals - 08/21/23 1559       Personal Nutrition Goals   Nutrition Goal Will meet with RD in the next 2 weeks      Intervention Plan   Intervention Prescribe, educate and counsel regarding individualized specific dietary modifications aiming towards targeted core components such as weight, hypertension, lipid management, diabetes, heart failure and other comorbidities.;Nutrition handout(s) given to patient.    Expected Outcomes Short Term Goal: Understand basic principles of dietary content, such as calories, fat, sodium, cholesterol and nutrients.;Short Term Goal: A plan has been developed with personal nutrition goals set during dietitian appointment.;Long Term Goal: Adherence to prescribed nutrition plan.             Nutrition Assessments:  MEDIFICTS Score Key: >=70 Need to make dietary changes  40-70 Heart Healthy Diet <= 40 Therapeutic Level Cholesterol Diet  Flowsheet Row Cardiac Rehab from 08/21/2023 in [facility] Cardiac and Pulmonary Rehab  Picture Your Plate Total Score on Admission 69      Picture Your Plate  Scores: <86 Unhealthy dietary pattern with much room for improvement. 41-50 Dietary pattern unlikely to meet recommendations for good health and room for improvement. 51-60 More healthful dietary pattern, with some room for improvement.  >60 Healthy dietary pattern, although there may be some specific behaviors that could be improved.    Nutrition Goals Re-Evaluation:   Nutrition Goals Discharge (Final Nutrition Goals Re-Evaluation):   Psychosocial: Target Goals: Acknowledge presence or absence of significant depression and/or stress, maximize coping skills, provide positive support system. Participant is able to verbalize types and ability to use techniques and skills needed for reducing stress and depression.   Education: Stress, Anxiety, and Depression - Group verbal and visual presentation to define topics covered.  Reviews how body is impacted by stress, anxiety, and depression.  Also discusses healthy ways to reduce stress and to treat/manage anxiety and depression.  Written material given at graduation.   Education: Sleep Hygiene -Provides group verbal and written instruction about how sleep can affect your health.  Define sleep hygiene, discuss sleep cycles and impact of sleep habits.  Review good sleep hygiene tips.    Initial Review & Psychosocial Screening:  Initial Psych Review & Screening - 08/08/23 1447       Initial Review   Current issues with Current Sleep Concerns      Family Dynamics   Good Support System? Yes   wife     Screening Interventions   Interventions To provide support and resources with identified psychosocial needs    Expected Outcomes Short Term goal: Utilizing psychosocial counselor, staff and physician to assist with identification of specific Stressors or current issues interfering with healing process. Setting desired goal for each stressor or current issue identified.;Long Term Goal: Stressors or current issues are controlled or  eliminated.;Short Term goal: Identification and review with participant of any Quality of Life or Depression concerns found by scoring the questionnaire.;Long Term goal: The participant improves quality of Life and PHQ9 Scores as seen by post scores and/or verbalization of changes             Quality of Life Scores:   Quality of Life - 08/21/23 1558       Quality of Life   Select Quality of Life      Quality of Life Scores   Health/Function Pre 12.53 %    Socioeconomic Pre 24.58 %    Psych/Spiritual Pre 20.14 %    Family Pre 25.2 %    GLOBAL Pre 18.26 %            Scores of 19 and below usually indicate a poorer quality of life in these areas.  A difference of  2-3 points is a clinically meaningful difference.  A difference of 2-3 points in the total score of the Quality of Life Index has been associated with significant improvement in overall quality of life, self-image, physical symptoms, and general health in studies assessing change in quality of life.  PHQ-9: Review Flowsheet  More data exists      08/21/2023 10/29/2022 12/18/2021 09/15/2021 06/28/2021  Depression screen PHQ 2/9  Decreased Interest 0 0 0 0 0  Down, Depressed, Hopeless 0 0 0 0 0  PHQ - 2 Score 0 0 0 0 0  Altered sleeping 3 - - - -  Tired, decreased energy 2 - - - -  Change in appetite 0 - - - -  Feeling bad or failure about yourself  0 - - - -  Trouble concentrating 0 - - - -  Moving slowly or fidgety/restless 0 - - - -  Suicidal thoughts 0 - - - -  PHQ-9 Score 5 - - - -  Difficult doing work/chores Not difficult at all - - - -    Details           Interpretation of Total Score  Total Score Depression Severity:  1-4 = Minimal depression, 5-9 = Mild depression, 10-14 = Moderate depression, 15-19 = Moderately severe depression, 20-27 = Severe depression   Psychosocial Evaluation and Intervention:  Psychosocial Evaluation - 08/08/23 1513       Psychosocial Evaluation & Interventions    Interventions Relaxation education;Encouraged to exercise with the program and follow exercise prescription    Comments Mike "Lorin Picket" is coming to cardiac rehab post NSTEMI. 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 He has no barriers to attending the program. He wants to improve his strength and become physically able and comfortable walking around. He has some sleep concerns but stress seems to be under control. He has a good support system at home with his wife. Lorin Picket is looking forward to starting rehab.    Expected Outcomes Short: Attend cardiac rehab for education and exercise.  Long: [place] and maintain positive self care habits    Continue Psychosocial Services  Follow up required by staff             Psychosocial Re-Evaluation:   Psychosocial Discharge (Final Psychosocial Re-Evaluation):   Vocational Rehabilitation: Provide vocational rehab assistance to qualifying candidates.   Vocational Rehab Evaluation & Intervention:  Vocational Rehab - 08/08/23 1505       Initial Vocational Rehab Evaluation & Intervention   Assessment shows need for Vocational Rehabilitation No             Education: Education Goals: Education classes will be provided on a variety of topics geared toward better understanding of heart health and risk factor modification. Participant will state understanding/return demonstration of topics presented as noted by education test scores.  Learning Barriers/Preferences:  Learning Barriers/Preferences - 08/08/23 1452       Learning Barriers/Preferences   Learning Barriers None    Learning Preferences None             General Cardiac Education Topics:  AED/CPR: - Group verbal and written  instruction with the use of models to demonstrate the basic use of the AED with the basic ABC's of resuscitation.   Anatomy and Cardiac Procedures: - Group verbal and visual presentation and models provide information about basic cardiac anatomy and function. Reviews the testing methods done to diagnose heart disease and the outcomes of the test results. Describes the treatment choices: Medical Management, Angioplasty, or Coronary Bypass Surgery for treating various heart conditions including Myocardial Infarction, Angina, Valve Disease, and Cardiac Arrhythmias.  Written material given at graduation.   Medication Safety: - Group verbal and visual instruction to review commonly prescribed medications for heart and lung disease. Reviews the medication, class of the drug, and side effects. Includes the steps to properly store meds and maintain the prescription regimen.  Written material given at graduation.   Intimacy: - Group verbal instruction through game format to discuss how heart and lung disease can affect sexual intimacy. Written material given at graduation..   Know Your Numbers and Heart Failure: - Group verbal and visual instruction to discuss disease risk factors for cardiac and pulmonary disease and treatment options.  Reviews associated critical values for Overweight/Obesity, Hypertension, Cholesterol, and Diabetes.  Discusses basics of heart failure: signs/symptoms and treatments.  Introduces Heart Failure Zone chart for action plan for heart failure.  Written material given at graduation.   Infection Prevention: - Provides verbal and written material to individual with discussion of infection control including proper hand washing and proper equipment cleaning during exercise session. Flowsheet Row Cardiac Rehab from 08/21/2023 in [facility] Cardiac and Pulmonary Rehab  Date 08/21/23  Educator MB  Instruction Review Code 1- Verbalizes Understanding       Falls Prevention: -  Provides verbal and written material to individual with discussion of falls prevention and safety. Flowsheet Row Cardiac Rehab from 08/21/2023 in [facility] Cardiac and Pulmonary Rehab  Date 08/21/23  Educator MB  Instruction Review Code 1- Verbalizes Understanding       Other: -Provides group and verbal instruction on  various topics (see comments)   Knowledge Questionnaire Score:  Knowledge Questionnaire Score - 08/21/23 1603       Knowledge Questionnaire Score   Pre Score 23/26             Core Components/Risk Factors/Patient Goals at Admission:  Personal Goals and Risk Factors at Admission - 08/21/23 1603       Core Components/Risk Factors/Patient Goals on Admission    Weight Management Yes;Weight Loss    Intervention Weight Management: Develop a combined nutrition and exercise program designed to reach desired caloric intake, while maintaining appropriate intake of nutrient and fiber, sodium and fats, and appropriate energy expenditure required for the weight goal.;Weight Management: Provide education and appropriate resources to help participant work on and attain dietary goals.;Weight Management/Obesity: Establish reasonable short term and long term weight goals.    Admit Weight 188 lb 9.6 oz (85.5 kg)    Goal Weight: Short Term 178 lb (80.7 kg)    Goal Weight: Long Term 173 lb (78.5 kg)    Expected Outcomes Short Term: Continue to assess and modify interventions until short term weight is achieved;Long Term: Adherence to nutrition and physical activity/exercise program aimed toward attainment of established weight goal;Weight Maintenance: Understanding of the daily nutrition guidelines, which includes 25-35% calories from fat, 7% or less cal from saturated fats, less than 200mg  cholesterol, less than 1.5gm of sodium, & 5 or more servings of fruits and vegetables daily;Understanding recommendations for meals to include 15-35% energy as protein, 25-35% energy from fat, 35-60% energy  from carbohydrates, less than 200mg  of dietary cholesterol, 20-35 gm of total fiber daily;Weight Loss: Understanding of general recommendations for a balanced deficit meal plan, which promotes 1-2 lb weight loss per week and includes a negative energy balance of [phone] kcal/d;Understanding of distribution of calorie intake throughout the day with the consumption of 4-5 meals/snacks    Improve shortness of breath with ADL's Yes    Intervention Provide education, individualized exercise plan and daily activity instruction to help decrease symptoms of SOB with activities of daily living.    Expected Outcomes Short Term: Improve cardiorespiratory fitness to achieve a reduction of symptoms when performing ADLs;Long Term: Be able to perform more ADLs without symptoms or delay the onset of symptoms    Diabetes Yes    Intervention Provide education about signs/symptoms and action to take for hypo/hyperglycemia.;Provide education about proper nutrition, including hydration, and aerobic/resistive exercise prescription along with prescribed medications to achieve blood glucose in normal ranges: Fasting glucose 65-99 mg/dL    Expected Outcomes Short Term: Participant verbalizes understanding of the signs/symptoms and immediate care of hyper/hypoglycemia, proper foot care and importance of medication, aerobic/resistive exercise and nutrition plan for blood glucose control.;Long Term: Attainment of HbA1C < 7%.    Heart Failure Yes    Intervention Provide a combined exercise and nutrition program that is supplemented with education, support and counseling about heart failure. Directed toward relieving symptoms such as shortness of breath, decreased exercise tolerance, and extremity edema.    Expected Outcomes Improve functional capacity of life;Short term: Attendance in program 2-3 days a week with increased exercise capacity. Reported lower sodium intake. Reported increased fruit and vegetable intake. Reports  medication compliance.;Short term: Daily weights obtained and reported for increase. Utilizing diuretic protocols set by physician.;Long term: Adoption of self-care skills and reduction of barriers for early signs and symptoms recognition and intervention leading to self-care maintenance.    Hypertension Yes    Intervention Provide education on lifestyle modifcations  including regular physical activity/exercise, weight management, moderate sodium restriction and increased consumption of fresh fruit, vegetables, and low fat dairy, alcohol moderation, and smoking cessation.;Monitor prescription use compliance.    Expected Outcomes Short Term: Continued assessment and intervention until BP is < 140/37mm HG in hypertensive participants. < 130/43mm HG in hypertensive participants with diabetes, heart failure or chronic kidney disease.;Long Term: Maintenance of blood pressure at goal levels.    Lipids Yes    Intervention Provide education and support for participant on nutrition & aerobic/resistive exercise along with prescribed medications to achieve LDL 70mg , HDL >40mg .    Expected Outcomes Short Term: Participant states understanding of desired cholesterol values and is compliant with medications prescribed. Participant is following exercise prescription and nutrition guidelines.;Long Term: Cholesterol controlled with medications as prescribed, with individualized exercise RX and with personalized nutrition plan. Value goals: LDL < 70mg , HDL > 40 mg.             Education:Diabetes - Individual verbal and written instruction to review signs/symptoms of diabetes, desired ranges of glucose level fasting, after meals and with exercise. Acknowledge that pre and post exercise glucose checks will be done for 3 sessions at entry of program. Flowsheet Row Cardiac Rehab from 08/21/2023 in [facility] Cardiac and Pulmonary Rehab  Date 08/21/23  Educator MB  Instruction Review Code 1- Verbalizes Understanding        Core Components/Risk Factors/Patient Goals Review:    Core Components/Risk Factors/Patient Goals at Discharge (Final Review):    ITP Comments:  ITP Comments     Row Name 08/08/23 1510 08/21/23 1550         ITP Comments Initial phone call completed. Dx can be found in [facility] 7/12. EP orientation scheduled for 9/11 at 1:30 pm. Completed and gym orientation. Initial ITP created and sent for review to Dr. Daniel Nones.               Comments: Initial ITP

## 2023-08-26 ENCOUNTER — Encounter: Payer: Medicare Other | Admitting: *Deleted

## 2023-08-26 DIAGNOSIS — I2089 Other forms of angina pectoris: Secondary | ICD-10-CM | POA: Diagnosis not present

## 2023-08-26 DIAGNOSIS — I214 Non-ST elevation (NSTEMI) myocardial infarction: Secondary | ICD-10-CM

## 2023-08-26 DIAGNOSIS — Z48812 Encounter for surgical aftercare following surgery on the circulatory system: Secondary | ICD-10-CM | POA: Diagnosis not present

## 2023-08-26 DIAGNOSIS — I252 Old myocardial infarction: Secondary | ICD-10-CM | POA: Diagnosis not present

## 2023-08-26 LAB — GLUCOSE, CAPILLARY
Glucose-Capillary: 218 mg/dL — ABNORMAL HIGH (ref 70–99)
Glucose-Capillary: 219 mg/dL — ABNORMAL HIGH (ref 70–99)

## 2023-08-26 NOTE — Progress Notes (Signed)
Daily Session Note  Patient Details  Name: Mike Decker MRN: [id number] Date of Birth: [birth year] Referring Provider:   Flowsheet Row Cardiac Rehab from 08/21/2023 in [facility] Cardiac and Pulmonary Rehab  Referring Provider [facility]       Encounter Date: 08/26/2023  Check In:  Session Check In - 08/26/23 1127       Check-In   Supervising physician immediately available to respond to emergencies See telemetry face sheet for immediately available ER MD    Location [facility]-Cardiac & Pulmonary Rehab    Staff Present Rory Percy, MS, Exercise Physiologist;Kelly Madilyn Fireman, BS, ACSM CEP, Exercise Physiologist;Kammy Klett Katrinka Blazing, RN, ADN    Virtual Visit No    Medication changes reported     No    Fall or balance concerns reported    No    Warm-up and Cool-down Performed on first and last piece of equipment    Resistance Training Performed Yes    VAD Patient? No    PAD/SET Patient? No      Pain Assessment   Currently in Pain? No/denies                Social History   Tobacco Use  Smoking Status Former   Current packs/day: 0.00   Types: Cigarettes   Start date: 12/10/1956   Quit date: 12/10/1976   Years since quitting: 46.7  Smokeless Tobacco Never    Goals Met:  Independence with exercise equipment Exercise tolerated well No report of concerns or symptoms today Strength training completed today  Goals Unmet:  Not Applicable  Comments: First full day of exercise!  Patient was oriented to gym and equipment including functions, settings, policies, and procedures.  Patient's individual exercise prescription and treatment plan were reviewed.  All starting workloads were established based on the results of the 6 minute walk test done at initial orientation visit.  The plan for exercise progression was also introduced and progression will be customized based on patient's performance and goals.    Dr. Bethann Punches is Medical Director for [facility] Cardiac Rehabilitation.  Dr.  Vida Rigger is Medical Director for [facility] Pulmonary Rehabilitation.

## 2023-08-28 ENCOUNTER — Encounter: Payer: Medicare Other | Admitting: *Deleted

## 2023-08-28 ENCOUNTER — Other Ambulatory Visit: Payer: Self-pay | Admitting: Physician Assistant

## 2023-08-28 DIAGNOSIS — I2089 Other forms of angina pectoris: Secondary | ICD-10-CM | POA: Diagnosis not present

## 2023-08-28 DIAGNOSIS — I252 Old myocardial infarction: Secondary | ICD-10-CM | POA: Diagnosis not present

## 2023-08-28 DIAGNOSIS — Z48812 Encounter for surgical aftercare following surgery on the circulatory system: Secondary | ICD-10-CM | POA: Diagnosis not present

## 2023-08-28 DIAGNOSIS — I214 Non-ST elevation (NSTEMI) myocardial infarction: Secondary | ICD-10-CM

## 2023-08-28 LAB — GLUCOSE, CAPILLARY
Glucose-Capillary: 109 mg/dL — ABNORMAL HIGH (ref 70–99)
Glucose-Capillary: 91 mg/dL (ref 70–99)

## 2023-08-28 NOTE — Progress Notes (Signed)
Daily Session Note  Patient Details  Name: Mike Decker MRN: [id number] Date of Birth: 23-May-1941 Referring Provider:   Flowsheet Row Cardiac Rehab from 08/21/2023 in [facility] Cardiac and Pulmonary Rehab  Referring Provider [facility]       Encounter Date: 08/28/2023  Check In:  Session Check In - 08/28/23 1136       Check-In   Supervising physician immediately available to respond to emergencies See telemetry face sheet for immediately available ER MD    Location [facility]-Cardiac & Pulmonary Rehab    Staff Present Rory Percy, MS, Exercise Physiologist;Joseph Reino Kent, RCP,RRT,BSRT;Maxon Keefton BS, , Exercise Physiologist;Ramina Hulet Katrinka Blazing, RN, ADN    Virtual Visit No    Medication changes reported     No    Fall or balance concerns reported    No    Warm-up and Cool-down Performed on first and last piece of equipment    Resistance Training Performed Yes    VAD Patient? No    PAD/SET Patient? No      Pain Assessment   Currently in Pain? No/denies                Social History   Tobacco Use  Smoking Status Former   Current packs/day: 0.00   Types: Cigarettes   Start date: 12/10/1956   Quit date: 12/10/1976   Years since quitting: 46.7  Smokeless Tobacco Never    Goals Met:  Independence with exercise equipment Exercise tolerated well No report of concerns or symptoms today Strength training completed today  Goals Unmet:  Not Applicable  Comments: Pt able to follow exercise prescription today without complaint.  Will continue to monitor for progression.    Dr. Bethann Punches is Medical Director for [facility] Cardiac Rehabilitation.  Dr. Vida Rigger is Medical Director for [facility] Pulmonary Rehabilitation.

## 2023-08-30 ENCOUNTER — Encounter: Payer: Medicare Other | Admitting: *Deleted

## 2023-08-30 DIAGNOSIS — I2089 Other forms of angina pectoris: Secondary | ICD-10-CM | POA: Diagnosis not present

## 2023-08-30 DIAGNOSIS — Z48812 Encounter for surgical aftercare following surgery on the circulatory system: Secondary | ICD-10-CM | POA: Diagnosis not present

## 2023-08-30 DIAGNOSIS — I214 Non-ST elevation (NSTEMI) myocardial infarction: Secondary | ICD-10-CM

## 2023-08-30 DIAGNOSIS — I252 Old myocardial infarction: Secondary | ICD-10-CM | POA: Diagnosis not present

## 2023-08-30 LAB — GLUCOSE, CAPILLARY
Glucose-Capillary: 159 mg/dL — ABNORMAL HIGH (ref 70–99)
Glucose-Capillary: 80 mg/dL (ref 70–99)

## 2023-08-30 NOTE — Progress Notes (Signed)
Daily Session Note  Patient Details  Name: Mike Decker MRN: [id number] Date of Birth: [birth year] Referring Provider:   Flowsheet Row Cardiac Rehab from 08/21/2023 in [facility] Cardiac and Pulmonary Rehab  Referring Provider [facility]       Encounter Date: 08/30/2023  Check In:  Session Check In - 08/30/23 1147       Check-In   Supervising physician immediately available to respond to emergencies See telemetry face sheet for immediately available ER MD    Location [facility]-Cardiac & Pulmonary Rehab    Staff Present Cora Collum, RN, BSN, CCRP;Joseph Hood, RCP,RRT,BSRT;Noah Tickle, [place], Exercise Physiologist    Virtual Visit No    Medication changes reported     No    Fall or balance concerns reported    No    Warm-up and Cool-down Performed on first and last piece of equipment    Resistance Training Performed Yes    VAD Patient? No    PAD/SET Patient? No      Pain Assessment   Currently in Pain? No/denies                Social History   Tobacco Use  Smoking Status Former   Current packs/day: 0.00   Types: Cigarettes   Start date: 12/10/1956   Quit date: 12/10/1976   Years since quitting: 46.7  Smokeless Tobacco Never    Goals Met:  Independence with exercise equipment Exercise tolerated well No report of concerns or symptoms today  Goals Unmet:  Not Applicable  Comments: Pt able to follow exercise prescription today without complaint.  Will continue to monitor for progression.    Dr. Bethann Punches is Medical Director for [facility] Cardiac Rehabilitation.  Dr. Vida Rigger is Medical Director for [facility] Pulmonary Rehabilitation.

## 2023-09-02 ENCOUNTER — Encounter: Payer: Medicare Other | Admitting: *Deleted

## 2023-09-02 DIAGNOSIS — I214 Non-ST elevation (NSTEMI) myocardial infarction: Secondary | ICD-10-CM

## 2023-09-02 DIAGNOSIS — I2089 Other forms of angina pectoris: Secondary | ICD-10-CM | POA: Diagnosis not present

## 2023-09-02 DIAGNOSIS — I252 Old myocardial infarction: Secondary | ICD-10-CM | POA: Diagnosis not present

## 2023-09-02 DIAGNOSIS — Z48812 Encounter for surgical aftercare following surgery on the circulatory system: Secondary | ICD-10-CM | POA: Diagnosis not present

## 2023-09-02 NOTE — Progress Notes (Signed)
Daily Session Note  Patient Details  Name: Mike Decker MRN: [id number] Date of Birth: 19-Sep-1941 Referring Provider:   Flowsheet Row Cardiac Rehab from 08/21/2023 in [facility] Cardiac and Pulmonary Rehab  Referring Provider [facility]       Encounter Date: 09/02/2023  Check In:  Session Check In - 09/02/23 1141       Check-In   Supervising physician immediately available to respond to emergencies See telemetry face sheet for immediately available ER MD    Location [facility]-Cardiac & Pulmonary Rehab    Staff Present Rory Percy, MS, Exercise Physiologist;Meredith Jewel Baize, RN BSN;Maxon Conetta BS, , Exercise Physiologist;Kelly Madilyn Fireman, BS, ACSM CEP, Exercise Physiologist;Neiva Maenza Katrinka Blazing, RN, ADN    Virtual Visit No    Medication changes reported     No    Fall or balance concerns reported    No    Warm-up and Cool-down Performed on first and last piece of equipment    Resistance Training Performed Yes    VAD Patient? No    PAD/SET Patient? No      Pain Assessment   Currently in Pain? No/denies                Social History   Tobacco Use  Smoking Status Former   Current packs/day: 0.00   Types: Cigarettes   Start date: 12/10/1956   Quit date: 12/10/1976   Years since quitting: 46.7  Smokeless Tobacco Never    Goals Met:  Independence with exercise equipment Exercise tolerated well No report of concerns or symptoms today Strength training completed today  Goals Unmet:  Not Applicable  Comments: Pt able to follow exercise prescription today without complaint.  Will continue to monitor for progression.    Dr. Bethann Punches is Medical Director for [facility] Cardiac Rehabilitation.  Dr. Vida Rigger is Medical Director for [facility] Pulmonary Rehabilitation.

## 2023-09-04 ENCOUNTER — Other Ambulatory Visit: Payer: Self-pay | Admitting: Internal Medicine

## 2023-09-04 ENCOUNTER — Encounter: Payer: Medicare Other | Admitting: *Deleted

## 2023-09-04 DIAGNOSIS — J4520 Mild intermittent asthma, uncomplicated: Secondary | ICD-10-CM | POA: Diagnosis not present

## 2023-09-04 DIAGNOSIS — I2089 Other forms of angina pectoris: Secondary | ICD-10-CM | POA: Diagnosis not present

## 2023-09-04 DIAGNOSIS — Z48812 Encounter for surgical aftercare following surgery on the circulatory system: Secondary | ICD-10-CM | POA: Diagnosis not present

## 2023-09-04 DIAGNOSIS — I252 Old myocardial infarction: Secondary | ICD-10-CM | POA: Diagnosis not present

## 2023-09-04 DIAGNOSIS — I214 Non-ST elevation (NSTEMI) myocardial infarction: Secondary | ICD-10-CM

## 2023-09-04 NOTE — Progress Notes (Signed)
Daily Session Note  Patient Details  Name: Mike Decker MRN: [id number] Date of Birth: [birth year] Referring Provider:   Flowsheet Row Cardiac Rehab from 08/21/2023 in [facility] Cardiac and Pulmonary Rehab  Referring Provider [facility]       Encounter Date: 09/04/2023  Check In:  Session Check In - 09/04/23 1140       Check-In   Supervising physician immediately available to respond to emergencies See telemetry face sheet for immediately available ER MD    Location [facility]-Cardiac & Pulmonary Rehab    Staff Present Bess Kinds RN, Atilano Median, RN, ADN;Maxon Conetta BS, , Exercise Physiologist;Laureen Manson Passey, BS, RRT, CPFT    Virtual Visit No    Medication changes reported     No    Fall or balance concerns reported    No    Warm-up and Cool-down Performed on first and last piece of equipment    Resistance Training Performed Yes    VAD Patient? No    PAD/SET Patient? No      Pain Assessment   Currently in Pain? No/denies                Social History   Tobacco Use  Smoking Status Former   Current packs/day: 0.00   Types: Cigarettes   Start date: 12/10/1956   Quit date: 12/10/1976   Years since quitting: 46.7  Smokeless Tobacco Never    Goals Met:  Independence with exercise equipment Exercise tolerated well No report of concerns or symptoms today Strength training completed today  Goals Unmet:  Not Applicable  Comments: Pt able to follow exercise prescription today without complaint.  Will continue to monitor for progression.    Dr. Bethann Punches is Medical Director for [facility] Cardiac Rehabilitation.  Dr. Vida Rigger is Medical Director for [facility] Pulmonary Rehabilitation.

## 2023-09-05 DIAGNOSIS — R0789 Other chest pain: Secondary | ICD-10-CM | POA: Diagnosis not present

## 2023-09-05 DIAGNOSIS — I214 Non-ST elevation (NSTEMI) myocardial infarction: Secondary | ICD-10-CM | POA: Diagnosis not present

## 2023-09-05 DIAGNOSIS — R002 Palpitations: Secondary | ICD-10-CM | POA: Diagnosis not present

## 2023-09-05 DIAGNOSIS — I480 Paroxysmal atrial fibrillation: Secondary | ICD-10-CM | POA: Diagnosis not present

## 2023-09-05 DIAGNOSIS — Z8673 Personal history of transient ischemic attack (TIA), and cerebral infarction without residual deficits: Secondary | ICD-10-CM | POA: Diagnosis not present

## 2023-09-05 DIAGNOSIS — N184 Chronic kidney disease, stage 4 (severe): Secondary | ICD-10-CM | POA: Diagnosis not present

## 2023-09-05 DIAGNOSIS — I5032 Chronic diastolic (congestive) heart failure: Secondary | ICD-10-CM | POA: Diagnosis not present

## 2023-09-05 DIAGNOSIS — I10 Essential (primary) hypertension: Secondary | ICD-10-CM | POA: Diagnosis not present

## 2023-09-05 DIAGNOSIS — I2510 Atherosclerotic heart disease of native coronary artery without angina pectoris: Secondary | ICD-10-CM | POA: Diagnosis not present

## 2023-09-05 DIAGNOSIS — R0602 Shortness of breath: Secondary | ICD-10-CM | POA: Diagnosis not present

## 2023-09-05 DIAGNOSIS — E782 Mixed hyperlipidemia: Secondary | ICD-10-CM | POA: Diagnosis not present

## 2023-09-05 DIAGNOSIS — I739 Peripheral vascular disease, unspecified: Secondary | ICD-10-CM | POA: Diagnosis not present

## 2023-09-05 LAB — SPECIMEN STATUS REPORT

## 2023-09-06 LAB — GRAM STAIN W/SPUTUM CULT RFLX

## 2023-09-09 LAB — GRAM STAIN W/SPUTUM CULT RFLX

## 2023-09-09 LAB — SPUTUM CULTURE

## 2023-09-09 LAB — SPECIMEN STATUS REPORT

## 2023-09-11 ENCOUNTER — Encounter: Payer: Self-pay | Admitting: *Deleted

## 2023-09-11 ENCOUNTER — Encounter: Payer: Medicare Other | Attending: Internal Medicine | Admitting: *Deleted

## 2023-09-11 DIAGNOSIS — I252 Old myocardial infarction: Secondary | ICD-10-CM | POA: Diagnosis not present

## 2023-09-11 DIAGNOSIS — I214 Non-ST elevation (NSTEMI) myocardial infarction: Secondary | ICD-10-CM

## 2023-09-11 DIAGNOSIS — I2089 Other forms of angina pectoris: Secondary | ICD-10-CM | POA: Insufficient documentation

## 2023-09-11 DIAGNOSIS — Z5189 Encounter for other specified aftercare: Secondary | ICD-10-CM | POA: Insufficient documentation

## 2023-09-11 NOTE — Progress Notes (Signed)
Daily Session Note  Patient Details  Name: Mike Decker MRN: [id number] Date of Birth: Dec 17, 1940 Referring Provider:   Flowsheet Row Cardiac Rehab from 08/21/2023 in [facility] Cardiac and Pulmonary Rehab  Referring Provider [facility]       Encounter Date: 09/11/2023  Check In:  Session Check In - 09/11/23 1119       Check-In   Supervising physician immediately available to respond to emergencies See telemetry face sheet for immediately available ER MD    Location [facility]-Cardiac & Pulmonary Rehab    Staff Present Rory Percy, MS, Exercise Physiologist;Joseph Reino Kent, RCP,RRT,BSRT;Maxon Fraser BS, , Exercise Physiologist;Jenae Tomasello Katrinka Blazing, RN, [place]    Virtual Visit No    Medication changes reported     Yes    Comments start clonidine 0.1 mg PRN for SBP > 175    Fall or balance concerns reported    No    Warm-up and Cool-down Performed on first and last piece of equipment    Resistance Training Performed Yes    VAD Patient? No    PAD/SET Patient? No      Pain Assessment   Currently in Pain? No/denies                Social History   Tobacco Use  Smoking Status Former   Current packs/day: 0.00   Types: Cigarettes   Start date: 12/10/1956   Quit date: 12/10/1976   Years since quitting: 46.7  Smokeless Tobacco Never    Goals Met:  Independence with exercise equipment Exercise tolerated well No report of concerns or symptoms today Strength training completed today  Goals Unmet:  Not Applicable  Comments: Pt able to follow exercise prescription today without complaint.  Will continue to monitor for progression.    Dr. Bethann Punches is Medical Director for [facility] Cardiac Rehabilitation.  Dr. Vida Rigger is Medical Director for [facility] Pulmonary Rehabilitation.

## 2023-09-11 NOTE — Progress Notes (Signed)
Cardiac Individual Treatment Plan  Patient Details  Name: Mike Decker MRN: [id number] Date of Birth: 08-31-1941 Referring Provider:   Flowsheet Row Cardiac Rehab from 08/21/2023 in [facility] Cardiac and Pulmonary Rehab  Referring Provider [facility]       Initial Encounter Date:  Flowsheet Row Cardiac Rehab from 08/21/2023 in [facility] Cardiac and Pulmonary Rehab  Date 08/21/23       Visit Diagnosis: NSTEMI (non-ST elevation myocardial infarction) [facility])  Patient's Home Medications on Admission:  Current Outpatient Medications:    acetaminophen (TYLENOL) 500 MG tablet, Take 1,000 mg by mouth every 8 (eight) hours as needed for mild pain., Disp: , Rfl:    allopurinol (ZYLOPRIM) 100 MG tablet, Take 100 mg by mouth daily., Disp: , Rfl:    atorvastatin (LIPITOR) 80 MG tablet, Take 1 tablet (80 mg total) by mouth daily., Disp: 30 tablet, Rfl: 0   azelastine (ASTELIN) 0.1 % nasal spray, USE 1 TO 2 SPRAYS IN EACH NOSTRIL TWICE A DAY, Disp: , Rfl:    calcitRIOL (ROCALTROL) 0.25 MCG capsule, Take 0.25 mcg by mouth daily., Disp: , Rfl:    cetirizine (ZYRTEC) 5 MG tablet, Take 5 mg by mouth daily., Disp: , Rfl:    citalopram (CELEXA) 10 MG tablet, Take 1 tablet (10 mg total) by mouth daily., Disp: 90 tablet, Rfl: 3   clopidogrel (PLAVIX) 75 MG tablet, TAKE 1 TABLET BY MOUTH EVERY DAY, Disp: 90 tablet, Rfl: 1   colchicine 0.6 MG tablet, Take 3 tablets (1.8 mg total) by mouth daily as needed (gout flares)., Disp: , Rfl:    [employer] VITAMIN B12 1000 MCG tablet, TAKE 1 TABLET BY MOUTH EVERY DAY, Disp: 30 tablet, Rfl: 0   diltiazem (CARDIZEM CD) 120 MG 24 hr capsule, Take 1 capsule (120 mg total) by mouth daily., Disp: 30 capsule, Rfl: 0   EPINEPHrine (EPI-PEN) 0.3 mg/0.3 mL SOAJ injection, Inject into the muscle as needed (anaphylaxis)., Disp: , Rfl:    ergocalciferol (VITAMIN D2) 1.25 MG (50000 UT) capsule, Take 50,000 Units by mouth every 30 (thirty) days., Disp: , Rfl:    ferrous sulfate 325 (65 FE) MG  EC tablet, TAKE 1 TABLET BY MOUTH EVERY DAY, Disp: 90 tablet, Rfl: 3   fluticasone (FLONASE) 50 MCG/ACT nasal spray, USE 1 SPRAY IN EACH NOSTRIL EVERY DAY, Disp: 32 g, Rfl: 6   folic acid (FOLVITE) 400 MCG tablet, Take 400 mcg by mouth daily., Disp: , Rfl:    hydrALAZINE (APRESOLINE) 50 MG tablet, Take 50 mg by mouth 2 (two) times daily., Disp: , Rfl:    insulin aspart (NOVOLOG) 100 UNIT/ML FlexPen, 10 UNITS IN AM, 30 UNITS AT LUNCH, 30 UNITS AT DINNER, Disp: , Rfl:    insulin glargine (LANTUS) 100 UNIT/ML injection, Inject 0.35 mLs (35 Units total) into the skin at bedtime. This is a decrease from your previous 45 units nightly. (Patient taking differently: Inject 54 Units into the skin at bedtime.), Disp: 10 mL, Rfl: 11   isosorbide mononitrate (IMDUR) 60 MG 24 hr tablet, Take 1 tablet (60 mg total) by mouth daily., Disp: 30 tablet, Rfl: 0   JARDIANCE 10 MG TABS tablet, Take 10 mg by mouth daily., Disp: , Rfl:    magnesium oxide (MAG-OX) 400 (240 Mg) MG tablet, Take 1 tablet by mouth daily., Disp: , Rfl:    methocarbamol (ROBAXIN) 500 MG tablet, Take 500 mg by mouth at bedtime., Disp: , Rfl: 0   metoprolol succinate (TOPROL-XL) 50 MG 24 hr tablet, Take 1  tablet by mouth daily., Disp: , Rfl:    mometasone (ASMANEX) 220 MCG/ACT inhaler, Inhale 2 puffs into the lungs daily., Disp: , Rfl:    montelukast (SINGULAIR) 10 MG tablet, TAKE 1 TABLET AT BEDTIME, Disp: 90 tablet, Rfl: 3   nitroGLYCERIN (NITROSTAT) 0.4 MG SL tablet, Place 0.4 mg under the tongue every 5 (five) minutes x 3 doses as needed for chest pain., Disp: , Rfl:    pantoprazole (PROTONIX) 40 MG tablet, Take 1 tablet (40 mg total) by mouth 2 (two) times daily before a meal., Disp: , Rfl:    ranolazine (RANEXA) 500 MG 12 hr tablet, Take 1 tablet (500 mg total) by mouth daily., Disp: 30 tablet, Rfl: 0   sodium bicarbonate 650 MG tablet, Take 650 mg by mouth 2 (two) times daily., Disp: , Rfl:    sodium fluoride (DENTA 5000 PLUS) 1.1 % CREA  dental cream, See admin instructions., Disp: , Rfl:    Tiotropium Bromide-Olodaterol (STIOLTO RESPIMAT) 2.5-2.5 MCG/ACT AERS, Inhale 1 each into the lungs daily., Disp: , Rfl:    Torsemide 40 MG TABS, Take 40 mg by mouth 2 (two) times daily., Disp: , Rfl:   Past Medical History: 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, esphageal   Chronic diastolic CHF (congestive heart failure) (HCC)    Colon polyp    Diabetes mellitus without complication (HCC)    Diverticulosis    Gout    Heart disease    Hemangioma    liver   Hyperlipidemia    Hypertension    Myocardial infarct (HCC)    Ocular hypertension    Peripheral vascular disease (HCC)    Skin cancer    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HCC)    Sleep apnea    Vitreoretinal degeneration     Tobacco Use: Social History   Tobacco Use  Smoking Status Former   Current packs/day: 0.00   Types: Cigarettes   Start date: 12/10/1956   Quit date: 12/10/1976   Years since quitting: 46.7  Smokeless Tobacco Never    Labs: Review Flowsheet  More data exists      Latest Ref Rng & Units 12/29/2019 07/18/2021 08/15/2022 08/16/2022 04/19/2023  Labs for ITP Cardiac and Pulmonary Rehab  Cholestrol 0 - 200 mg/dL 784  - - 696  -  LDL (calc) 0 - 99 mg/dL 51  - - 33  -  HDL-C >29 mg/dL 31  - - 17  -  Trlycerides <150 mg/dL 528  - - 413  -  Hemoglobin A1c 4.8 - 5.6 % - 8.3     7.5  - 6.2     Details       This result is from an external source.          Exercise Target Goals: Exercise Program Goal: Individual exercise prescription set using results from initial 6 min walk test and THRR while considering  patient's activity barriers and safety.   Exercise Prescription Goal: Initial exercise prescription builds to 30-45 minutes a day of aerobic activity, 2-3 days per week.  Home exercise guidelines will be given  to patient during program as part of exercise prescription that the participant will acknowledge.   Education: Aerobic Exercise: - Group verbal and visual presentation on the components of exercise prescription. Introduces F.I.T.T principle from  ACSM for exercise prescriptions.  Reviews F.I.T.T. principles of aerobic exercise including progression. Written material given at graduation. Flowsheet Row Cardiac Rehab from 08/21/2023 in [facility] Cardiac and Pulmonary Rehab  Education need identified 08/21/23       Education: Resistance Exercise: - Group verbal and visual presentation on the components of exercise prescription. Introduces F.I.T.T principle from ACSM for exercise prescriptions  Reviews F.I.T.T. principles of resistance exercise including progression. Written material given at graduation.    Education: Exercise & Equipment Safety: - Individual verbal instruction and demonstration of equipment use and safety with use of the equipment. Flowsheet Row Cardiac Rehab from 08/21/2023 in [facility] Cardiac and Pulmonary Rehab  Date 08/21/23  Educator MB  Instruction Review Code 1- Verbalizes Understanding       Education: Exercise Physiology & General Exercise Guidelines: - Group verbal and written instruction with models to review the exercise physiology of the cardiovascular system and associated critical values. Provides general exercise guidelines with specific guidelines to those with heart or lung disease.    Education: Flexibility, Balance, Mind/Body Relaxation: - Group verbal and visual presentation with interactive activity on the components of exercise prescription. Introduces F.I.T.T principle from ACSM for exercise prescriptions. Reviews F.I.T.T. principles of flexibility and balance exercise training including progression. Also discusses the mind body connection.  Reviews various relaxation techniques to help reduce and manage stress (i.e. Deep breathing, progressive muscle  relaxation, and visualization). Balance handout provided to take home. Written material given at graduation.   Activity Barriers & Risk Stratification:  Activity Barriers & Cardiac Risk Stratification - 08/21/23 1552       Activity Barriers & Cardiac Risk Stratification   Activity Barriers Back Problems;Other (comment);Muscular Weakness    Comments Gout flares occasionally; back problems, but tolerates    Cardiac Risk Stratification High             6 Minute Walk:  6 Minute Walk     Row Name 08/21/23 1551         6 Minute Walk   Phase Initial     Distance 890 feet     Walk Time 6 minutes     # of Rest Breaks 0     MPH 1.69     METS 1.43     RPE 13     Perceived Dyspnea  1     VO2 Peak 4.99     Symptoms Yes (comment)     Comments Fatigue in legs     Resting HR 69 bpm     Resting BP 140/70     Resting Oxygen Saturation  97 %     Exercise Oxygen Saturation  during 6 min walk 99 %     Max Ex. HR 104 bpm     Max Ex. BP 142/58     2 Minute Post BP 140/66              Oxygen Initial Assessment:   Oxygen Re-Evaluation:   Oxygen Discharge (Final Oxygen Re-Evaluation):   Initial Exercise Prescription:  Initial Exercise Prescription - 08/21/23 1500       Date of Initial Exercise RX and Referring Provider   Date 08/21/23    Referring Provider [facility]      Oxygen   Maintain Oxygen Saturation 88% or higher      Treadmill   MPH 1.4    Grade 0    Minutes 15    METs 2.07      Recumbant Bike   Level  1    RPM 50    Watts 15    Minutes 15    METs 1.43      NuStep   Level 1   T4 and T6   SPM 80    Minutes 15    METs 1.43      T5 Nustep   Level 1    SPM 80    Minutes 15    METs 1.43      Prescription Details   Frequency (times per week) 3    Duration Progress to 30 minutes of continuous aerobic without signs/symptoms of physical distress      Intensity   THRR 40-80% of Max Heartrate 96-123    Ratings of Perceived Exertion 11-13     Perceived Dyspnea 0-4      Progression   Progression Continue to progress workloads to maintain intensity without signs/symptoms of physical distress.      Resistance Training   Training Prescription Yes    Weight 4 lb    Reps 10-15             Perform Capillary Blood Glucose checks as needed.  Exercise Prescription Changes:   Exercise Prescription Changes     Row Name 08/21/23 1500             Response to Exercise   Blood Pressure (Admit) 140/70       Blood Pressure (Exercise) 142/58       Blood Pressure (Exit) 140/66       Heart Rate (Admit) 69 bpm       Heart Rate (Exercise) 104 bpm       Heart Rate (Exit) 72 bpm       Oxygen Saturation (Admit) 97 %       Oxygen Saturation (Exercise) 99 %       Oxygen Saturation (Exit) 97 %       Rating of Perceived Exertion (Exercise) 13       Perceived Dyspnea (Exercise) 1       Symptoms fatigue in legs       Comments results       Duration Progress to 30 minutes of  aerobic without signs/symptoms of physical distress       Intensity THRR New         Progression   Progression Continue to progress workloads to maintain intensity without signs/symptoms of physical distress.       Average METs 1.43                Exercise Comments:   Exercise Comments     Row Name 08/26/23 1130           Exercise Comments First full day of exercise!  Patient was oriented to gym and equipment including functions, settings, policies, and procedures.  Patient's individual exercise prescription and treatment plan were reviewed.  All starting workloads were established based on the results of the 6 minute walk test done at initial orientation visit.  The plan for exercise progression was also introduced and progression will be customized based on patient's performance and goals.                Exercise Goals and Review:   Exercise Goals     Row Name 08/21/23 1556             Exercise Goals   Increase Physical  Activity Yes       Intervention Provide advice, education, support and counseling  about physical activity/exercise needs.;Develop an individualized exercise prescription for aerobic and resistive training based on initial evaluation findings, risk stratification, comorbidities and participant's personal goals.       Expected Outcomes Short Term: Attend rehab on a regular basis to increase amount of physical activity.;Long Term: Add in home exercise to make exercise part of routine and to increase amount of physical activity.;Long Term: Exercising regularly at least 3-5 days a week.       Increase Strength and Stamina Yes       Intervention Provide advice, education, support and counseling about physical activity/exercise needs.;Develop an individualized exercise prescription for aerobic and resistive training based on initial evaluation findings, risk stratification, comorbidities and participant's personal goals.       Expected Outcomes Short Term: Increase workloads from initial exercise prescription for resistance, speed, and METs.;Short Term: Perform resistance training exercises routinely during rehab and add in resistance training at home;Long Term: Improve cardiorespiratory fitness, muscular endurance and strength as measured by increased METs and functional capacity ( )       Able to understand and use rate of perceived exertion (RPE) scale Yes       Intervention Provide education and explanation on how to use RPE scale       Expected Outcomes Short Term: Able to use RPE daily in rehab to express subjective intensity level;Long Term:  Able to use RPE to guide intensity level when exercising independently       Able to understand and use Dyspnea scale Yes       Intervention Provide education and explanation on how to use Dyspnea scale       Expected Outcomes Short Term: Able to use Dyspnea scale daily in rehab to express subjective sense of shortness of breath during exertion;Long Term: Able to  use Dyspnea scale to guide intensity level when exercising independently       Knowledge and understanding of Target Heart Rate Range (THRR) Yes       Intervention Provide education and explanation of THRR including how the numbers were predicted and where they are located for reference       Expected Outcomes Short Term: Able to state/look up THRR;Short Term: Able to use daily as guideline for intensity in rehab;Long Term: Able to use THRR to govern intensity when exercising independently       Able to check pulse independently Yes       Intervention Provide education and demonstration on how to check pulse in carotid and radial arteries.;Review the importance of being able to check your own pulse for safety during independent exercise       Expected Outcomes Short Term: Able to explain why pulse checking is important during independent exercise;Long Term: Able to check pulse independently and accurately       Understanding of Exercise Prescription Yes       Intervention Provide education, explanation, and written materials on patient's individual exercise prescription       Expected Outcomes Short Term: Able to explain program exercise prescription;Long Term: Able to explain home exercise prescription to exercise independently                Exercise Goals Re-Evaluation :  Exercise Goals Re-Evaluation     Row Name 08/26/23 1130             Exercise Goal Re-Evaluation   Exercise Goals Review Able to understand and use rate of perceived exertion (RPE) scale;Able to understand and use Dyspnea scale;Knowledge and understanding  of Target Heart Rate Range (THRR);Understanding of Exercise Prescription       Comments Reviewed RPE  and dyspnea scale, THR and program prescription with pt today.  Pt voiced understanding and was given a copy of goals to take home.       Expected Outcomes Short: Use RPE daily to regulate intensity. Long: Follow program prescription in THR.                 Discharge Exercise Prescription (Final Exercise Prescription Changes):  Exercise Prescription Changes - 08/21/23 1500       Response to Exercise   Blood Pressure (Admit) 140/70    Blood Pressure (Exercise) 142/58    Blood Pressure (Exit) 140/66    Heart Rate (Admit) 69 bpm    Heart Rate (Exercise) 104 bpm    Heart Rate (Exit) 72 bpm    Oxygen Saturation (Admit) 97 %    Oxygen Saturation (Exercise) 99 %    Oxygen Saturation (Exit) 97 %    Rating of Perceived Exertion (Exercise) 13    Perceived Dyspnea (Exercise) 1    Symptoms fatigue in legs    Comments results    Duration Progress to 30 minutes of  aerobic without signs/symptoms of physical distress    Intensity THRR New      Progression   Progression Continue to progress workloads to maintain intensity without signs/symptoms of physical distress.    Average METs 1.43             Nutrition:  Target Goals: Understanding of nutrition guidelines, daily intake of sodium 1500mg , cholesterol 200mg , calories 30% from fat and 7% or less from saturated fats, daily to have 5 or more servings of fruits and vegetables.  Education: All About Nutrition: -Group instruction provided by verbal, written material, interactive activities, discussions, models, and posters to present general guidelines for heart healthy nutrition including fat, fiber, MyPlate, the role of sodium in heart healthy nutrition, utilization of the nutrition label, and utilization of this knowledge for meal planning. Follow up email sent as well. Written material given at graduation. Flowsheet Row Cardiac Rehab from 08/21/2023 in [facility] Cardiac and Pulmonary Rehab  Education need identified 08/21/23       Biometrics:  Pre Biometrics - 08/21/23 1557       Pre Biometrics   Height 5' 6.2" (1.681 m)    Weight 188 lb 9.6 oz (85.5 kg)    Waist Circumference 45.5 inches    Hip Circumference 46.5 inches    Waist to Hip Ratio 0.98 %    BMI (Calculated) 30.27     Single Leg Stand 4 seconds              Nutrition Therapy Plan and Nutrition Goals:  Nutrition Therapy & Goals - 08/21/23 1559       Personal Nutrition Goals   Nutrition Goal Will meet with RD in the next 2 weeks      Intervention Plan   Intervention Prescribe, educate and counsel regarding individualized specific dietary modifications aiming towards targeted core components such as weight, hypertension, lipid management, diabetes, heart failure and other comorbidities.;Nutrition handout(s) given to patient.    Expected Outcomes Short Term Goal: Understand basic principles of dietary content, such as calories, fat, sodium, cholesterol and nutrients.;Short Term Goal: A plan has been developed with personal nutrition goals set during dietitian appointment.;Long Term Goal: Adherence to prescribed nutrition plan.             Nutrition Assessments:  MEDIFICTS Score Key: >=70 Need to make dietary changes  40-70 Heart Healthy Diet <= 40 Therapeutic Level Cholesterol Diet  Flowsheet Row Cardiac Rehab from 08/21/2023 in [facility] Cardiac and Pulmonary Rehab  Picture Your Plate Total Score on Admission 69      Picture Your Plate Scores: <30 Unhealthy dietary pattern with much room for improvement. 41-50 Dietary pattern unlikely to meet recommendations for good health and room for improvement. 51-60 More healthful dietary pattern, with some room for improvement.  >60 Healthy dietary pattern, although there may be some specific behaviors that could be improved.    Nutrition Goals Re-Evaluation:   Nutrition Goals Discharge (Final Nutrition Goals Re-Evaluation):   Psychosocial: Target Goals: Acknowledge presence or absence of significant depression and/or stress, maximize coping skills, provide positive support system. Participant is able to verbalize types and ability to use techniques and skills needed for reducing stress and depression.   Education: Stress, Anxiety, and  Depression - Group verbal and visual presentation to define topics covered.  Reviews how body is impacted by stress, anxiety, and depression.  Also discusses healthy ways to reduce stress and to treat/manage anxiety and depression.  Written material given at graduation.   Education: Sleep Hygiene -Provides group verbal and written instruction about how sleep can affect your health.  Define sleep hygiene, discuss sleep cycles and impact of sleep habits. Review good sleep hygiene tips.    Initial Review & Psychosocial Screening:  Initial Psych Review & Screening - 08/08/23 1447       Initial Review   Current issues with Current Sleep Concerns      Family Dynamics   Good Support System? Yes   wife     Screening Interventions   Interventions To provide support and resources with identified psychosocial needs    Expected Outcomes Short Term goal: Utilizing psychosocial counselor, staff and physician to assist with identification of specific Stressors or current issues interfering with healing process. Setting desired goal for each stressor or current issue identified.;Long Term Goal: Stressors or current issues are controlled or eliminated.;Short Term goal: Identification and review with participant of any Quality of Life or Depression concerns found by scoring the questionnaire.;Long Term goal: The participant improves quality of Life and PHQ9 Scores as seen by post scores and/or verbalization of changes             Quality of Life Scores:   Quality of Life - 08/21/23 1558       Quality of Life   Select Quality of Life      Quality of Life Scores   Health/Function Pre 12.53 %    Socioeconomic Pre 24.58 %    Psych/Spiritual Pre 20.14 %    Family Pre 25.2 %    GLOBAL Pre 18.26 %            Scores of 19 and below usually indicate a poorer quality of life in these areas.  A difference of  2-3 points is a clinically meaningful difference.  A difference of 2-3 points in the total  score of the Quality of Life Index has been associated with significant improvement in overall quality of life, self-image, physical symptoms, and general health in studies assessing change in quality of life.  PHQ-9: Review Flowsheet  More data exists      08/21/2023 10/29/2022 12/18/2021 09/15/2021 06/28/2021  Depression screen PHQ 2/9  Decreased Interest 0 0 0 0 0  Down, Depressed, Hopeless 0 0 0 0 0  PHQ - 2  Score 0 0 0 0 0  Altered sleeping 3 - - - -  Tired, decreased energy 2 - - - -  Change in appetite 0 - - - -  Feeling bad or failure about yourself  0 - - - -  Trouble concentrating 0 - - - -  Moving slowly or fidgety/restless 0 - - - -  Suicidal thoughts 0 - - - -  PHQ-9 Score 5 - - - -  Difficult doing work/chores Not difficult at all - - - -    Details           Interpretation of Total Score  Total Score Depression Severity:  1-4 = Minimal depression, 5-9 = Mild depression, 10-14 = Moderate depression, 15-19 = Moderately severe depression, 20-27 = Severe depression   Psychosocial Evaluation and Intervention:  Psychosocial Evaluation - 08/08/23 1513       Psychosocial Evaluation & Interventions   Interventions Relaxation education;Encouraged to exercise with the program and follow exercise prescription    Comments Jasir "Mike Decker" is coming to cardiac rehab post NSTEMI. 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 He has no barriers to attending the program. He wants to improve his strength and become physically able and comfortable walking around. He has some sleep concerns but stress seems to be under control. He has a good support system at home with his wife. Mike Decker is looking forward to starting rehab.    Expected Outcomes  Short: Attend cardiac rehab for education and exercise.  Long: [place] and maintain positive self care habits    Continue Psychosocial Services  Follow up required by staff             Psychosocial Re-Evaluation:   Psychosocial Discharge (Final Psychosocial Re-Evaluation):   Vocational Rehabilitation: Provide vocational rehab assistance to qualifying candidates.   Vocational Rehab Evaluation & Intervention:  Vocational Rehab - 08/08/23 1505       Initial Vocational Rehab Evaluation & Intervention   Assessment shows need for Vocational Rehabilitation No             Education: Education Goals: Education classes will be provided on a variety of topics geared toward better understanding of heart health and risk factor modification. Participant will state understanding/return demonstration of topics presented as noted by education test scores.  Learning Barriers/Preferences:  Learning Barriers/Preferences - 08/08/23 1452       Learning Barriers/Preferences   Learning Barriers None    Learning Preferences None             General Cardiac Education Topics:  AED/CPR: - Group verbal and written instruction with the use of models to demonstrate the basic use of the AED with the basic ABC's of resuscitation.   Anatomy and Cardiac Procedures: - Group verbal and visual presentation and models provide information about basic cardiac anatomy and function. Reviews the testing methods done to diagnose heart disease and the outcomes of the test results. Describes the treatment choices: Medical Management, Angioplasty, or Coronary Bypass Surgery for treating various heart conditions including Myocardial Infarction, Angina, Valve Disease, and Cardiac Arrhythmias.  Written material given at graduation.   Medication Safety: - Group verbal and visual instruction to review commonly prescribed medications for heart and lung disease. Reviews  the medication, class of the drug, and side  effects. Includes the steps to properly store meds and maintain the prescription regimen.  Written material given at graduation.   Intimacy: - Group verbal instruction through game format to discuss how heart and lung disease can affect sexual intimacy. Written material given at graduation..   Know Your Numbers and Heart Failure: - Group verbal and visual instruction to discuss disease risk factors for cardiac and pulmonary disease and treatment options.  Reviews associated critical values for Overweight/Obesity, Hypertension, Cholesterol, and Diabetes.  Discusses basics of heart failure: signs/symptoms and treatments.  Introduces Heart Failure Zone chart for action plan for heart failure.  Written material given at graduation.   Infection Prevention: - Provides verbal and written material to individual with discussion of infection control including proper hand washing and proper equipment cleaning during exercise session. Flowsheet Row Cardiac Rehab from 08/21/2023 in [facility] Cardiac and Pulmonary Rehab  Date 08/21/23  Educator MB  Instruction Review Code 1- Verbalizes Understanding       Falls Prevention: - Provides verbal and written material to individual with discussion of falls prevention and safety. Flowsheet Row Cardiac Rehab from 08/21/2023 in [facility] Cardiac and Pulmonary Rehab  Date 08/21/23  Educator MB  Instruction Review Code 1- Verbalizes Understanding       Other: -Provides group and verbal instruction on various topics (see comments)   Knowledge Questionnaire Score:  Knowledge Questionnaire Score - 08/21/23 1603       Knowledge Questionnaire Score   Pre Score 23/26             Core Components/Risk Factors/Patient Goals at Admission:  Personal Goals and Risk Factors at Admission - 08/21/23 1603       Core Components/Risk Factors/Patient Goals on Admission    Weight Management Yes;Weight Loss    Intervention Weight Management: Develop a combined nutrition  and exercise program designed to reach desired caloric intake, while maintaining appropriate intake of nutrient and fiber, sodium and fats, and appropriate energy expenditure required for the weight goal.;Weight Management: Provide education and appropriate resources to help participant work on and attain dietary goals.;Weight Management/Obesity: Establish reasonable short term and long term weight goals.    Admit Weight 188 lb 9.6 oz (85.5 kg)    Goal Weight: Short Term 178 lb (80.7 kg)    Goal Weight: Long Term 173 lb (78.5 kg)    Expected Outcomes Short Term: Continue to assess and modify interventions until short term weight is achieved;Long Term: Adherence to nutrition and physical activity/exercise program aimed toward attainment of established weight goal;Weight Maintenance: Understanding of the daily nutrition guidelines, which includes 25-35% calories from fat, 7% or less cal from saturated fats, less than 200mg  cholesterol, less than 1.5gm of sodium, & 5 or more servings of fruits and vegetables daily;Understanding recommendations for meals to include 15-35% energy as protein, 25-35% energy from fat, 35-60% energy from carbohydrates, less than 200mg  of dietary cholesterol, 20-35 gm of total fiber daily;Weight Loss: Understanding of general recommendations for a balanced deficit meal plan, which promotes 1-2 lb weight loss per week and includes a negative energy balance of [phone] kcal/d;Understanding of distribution of calorie intake throughout the day with the consumption of 4-5 meals/snacks    Improve shortness of breath with ADL's Yes    Intervention Provide education, individualized exercise plan and daily activity instruction to help decrease symptoms of SOB with activities of daily living.    Expected Outcomes Short Term: Improve cardiorespiratory fitness to achieve a  reduction of symptoms when performing ADLs;Long Term: Be able to perform more ADLs without symptoms or delay the onset of  symptoms    Diabetes Yes    Intervention Provide education about signs/symptoms and action to take for hypo/hyperglycemia.;Provide education about proper nutrition, including hydration, and aerobic/resistive exercise prescription along with prescribed medications to achieve blood glucose in normal ranges: Fasting glucose 65-99 mg/dL    Expected Outcomes Short Term: Participant verbalizes understanding of the signs/symptoms and immediate care of hyper/hypoglycemia, proper foot care and importance of medication, aerobic/resistive exercise and nutrition plan for blood glucose control.;Long Term: Attainment of HbA1C < 7%.    Heart Failure Yes    Intervention Provide a combined exercise and nutrition program that is supplemented with education, support and counseling about heart failure. Directed toward relieving symptoms such as shortness of breath, decreased exercise tolerance, and extremity edema.    Expected Outcomes Improve functional capacity of life;Short term: Attendance in program 2-3 days a week with increased exercise capacity. Reported lower sodium intake. Reported increased fruit and vegetable intake. Reports medication compliance.;Short term: Daily weights obtained and reported for increase. Utilizing diuretic protocols set by physician.;Long term: Adoption of self-care skills and reduction of barriers for early signs and symptoms recognition and intervention leading to self-care maintenance.    Hypertension Yes    Intervention Provide education on lifestyle modifcations including regular physical activity/exercise, weight management, moderate sodium restriction and increased consumption of fresh fruit, vegetables, and low fat dairy, alcohol moderation, and smoking cessation.;Monitor prescription use compliance.    Expected Outcomes Short Term: Continued assessment and intervention until BP is < 140/8mm HG in hypertensive participants. < 130/15mm HG in hypertensive participants with diabetes,  heart failure or chronic kidney disease.;Long Term: Maintenance of blood pressure at goal levels.    Lipids Yes    Intervention Provide education and support for participant on nutrition & aerobic/resistive exercise along with prescribed medications to achieve LDL 70mg , HDL >40mg .    Expected Outcomes Short Term: Participant states understanding of desired cholesterol values and is compliant with medications prescribed. Participant is following exercise prescription and nutrition guidelines.;Long Term: Cholesterol controlled with medications as prescribed, with individualized exercise RX and with personalized nutrition plan. Value goals: LDL < 70mg , HDL > 40 mg.             Education:Diabetes - Individual verbal and written instruction to review signs/symptoms of diabetes, desired ranges of glucose level fasting, after meals and with exercise. Acknowledge that pre and post exercise glucose checks will be done for 3 sessions at entry of program. Flowsheet Row Cardiac Rehab from 08/21/2023 in [facility] Cardiac and Pulmonary Rehab  Date 08/21/23  Educator MB  Instruction Review Code 1- Verbalizes Understanding       Core Components/Risk Factors/Patient Goals Review:    Core Components/Risk Factors/Patient Goals at Discharge (Final Review):    ITP Comments:  ITP Comments     Row Name 08/08/23 1510 08/21/23 1550 08/26/23 1130 09/11/23 1009     ITP Comments Initial phone call completed. Dx can be found in [facility] 7/12. EP orientation scheduled for 9/11 at 1:30 pm. Completed and gym orientation. Initial ITP created and sent for review to Dr. Daniel Nones. First full day of exercise!  Patient was oriented to gym and equipment including functions, settings, policies, and procedures.  Patient's individual exercise prescription and treatment plan were reviewed.  All starting workloads were established based on the results of the 6 minute walk test done at initial orientation visit.  The plan for  exercise progression was also introduced and progression will be customized based on patient's performance and goals. 30 Day review completed. Medical Director ITP review done, changes made as directed, and signed approval by Medical Director.    new to program             Comments:

## 2023-09-13 ENCOUNTER — Encounter: Payer: Medicare Other | Admitting: *Deleted

## 2023-09-13 DIAGNOSIS — I252 Old myocardial infarction: Secondary | ICD-10-CM | POA: Diagnosis not present

## 2023-09-13 DIAGNOSIS — I2089 Other forms of angina pectoris: Secondary | ICD-10-CM | POA: Diagnosis not present

## 2023-09-13 DIAGNOSIS — Z5189 Encounter for other specified aftercare: Secondary | ICD-10-CM | POA: Diagnosis not present

## 2023-09-13 DIAGNOSIS — I214 Non-ST elevation (NSTEMI) myocardial infarction: Secondary | ICD-10-CM

## 2023-09-13 NOTE — Progress Notes (Signed)
Daily Session Note  Patient Details  Name: Breydon Senters MRN: [id number] Date of Birth: [birth year] Referring Provider:   Flowsheet Row Cardiac Rehab from 08/21/2023 in [facility] Cardiac and Pulmonary Rehab  Referring Provider [facility]       Encounter Date: 09/13/2023  Check In:  Session Check In - 09/13/23 1137       Check-In   Supervising physician immediately available to respond to emergencies See telemetry face sheet for immediately available ER MD    Location [facility]-Cardiac & Pulmonary Rehab    Staff Present Cora Collum, RN, BSN, CCRP;Noah Tickle, BS, Exercise Physiologist;Joseph [place], [place]    Virtual Visit No    Medication changes reported     No    Fall or balance concerns reported    No    Warm-up and Cool-down Performed on first and last piece of equipment    Resistance Training Performed Yes    VAD Patient? No    PAD/SET Patient? No      Pain Assessment   Currently in Pain? No/denies                Social History   Tobacco Use  Smoking Status Former   Current packs/day: 0.00   Types: Cigarettes   Start date: 12/10/1956   Quit date: 12/10/1976   Years since quitting: 46.7  Smokeless Tobacco Never    Goals Met:  Independence with exercise equipment Exercise tolerated well No report of concerns or symptoms today  Goals Unmet:  Not Applicable  Comments: Pt able to follow exercise prescription today without complaint.  Will continue to monitor for progression.    Dr. Bethann Punches is Medical Director for [facility] Cardiac Rehabilitation.  Dr. Vida Rigger is Medical Director for [facility] Pulmonary Rehabilitation.

## 2023-09-16 ENCOUNTER — Encounter: Payer: Medicare Other | Admitting: *Deleted

## 2023-09-16 DIAGNOSIS — I2089 Other forms of angina pectoris: Secondary | ICD-10-CM | POA: Diagnosis not present

## 2023-09-16 DIAGNOSIS — Z5189 Encounter for other specified aftercare: Secondary | ICD-10-CM | POA: Diagnosis not present

## 2023-09-16 DIAGNOSIS — I214 Non-ST elevation (NSTEMI) myocardial infarction: Secondary | ICD-10-CM

## 2023-09-16 DIAGNOSIS — I252 Old myocardial infarction: Secondary | ICD-10-CM | POA: Diagnosis not present

## 2023-09-16 NOTE — Progress Notes (Signed)
Daily Session Note  Patient Details  Name: Mike Decker MRN: [id number] Date of Birth: [birth year] Referring Provider:   Flowsheet Row Cardiac Rehab from 08/21/2023 in [facility] Cardiac and Pulmonary Rehab  Referring Provider [facility]       Encounter Date: 09/16/2023  Check In:  Session Check In - 09/16/23 1123       Check-In   Supervising physician immediately available to respond to emergencies See telemetry face sheet for immediately available ER MD    Location [facility]-Cardiac & Pulmonary Rehab    Staff Present Cora Collum, RN, BSN, CCRP;Margaret Best, MS, Exercise Physiologist;Maxon Conetta BS, , Exercise Physiologist;Kelly Madilyn Fireman, BS, ACSM CEP, Exercise Physiologist;Eryk Beavers Katrinka Blazing, RN, ADN    Virtual Visit No    Medication changes reported     No    Fall or balance concerns reported    No    Warm-up and Cool-down Performed on first and last piece of equipment    Resistance Training Performed Yes    VAD Patient? No    PAD/SET Patient? No      Pain Assessment   Currently in Pain? No/denies                Social History   Tobacco Use  Smoking Status Former   Current packs/day: 0.00   Types: Cigarettes   Start date: 12/10/1956   Quit date: 12/10/1976   Years since quitting: 46.7  Smokeless Tobacco Never    Goals Met:  Independence with exercise equipment Exercise tolerated well No report of concerns or symptoms today Strength training completed today  Goals Unmet:  Not Applicable  Comments: Pt able to follow exercise prescription today without complaint.  Will continue to monitor for progression.    Dr. Bethann Punches is Medical Director for [facility] Cardiac Rehabilitation.  Dr. Vida Rigger is Medical Director for [facility] Pulmonary Rehabilitation.

## 2023-09-17 DIAGNOSIS — I350 Nonrheumatic aortic (valve) stenosis: Secondary | ICD-10-CM | POA: Diagnosis not present

## 2023-09-17 DIAGNOSIS — I482 Chronic atrial fibrillation, unspecified: Secondary | ICD-10-CM | POA: Diagnosis not present

## 2023-09-17 DIAGNOSIS — I5033 Acute on chronic diastolic (congestive) heart failure: Secondary | ICD-10-CM | POA: Diagnosis not present

## 2023-09-17 DIAGNOSIS — Z87898 Personal history of other specified conditions: Secondary | ICD-10-CM | POA: Diagnosis not present

## 2023-09-17 DIAGNOSIS — N1832 Chronic kidney disease, stage 3b: Secondary | ICD-10-CM | POA: Diagnosis not present

## 2023-09-18 ENCOUNTER — Encounter: Payer: Medicare Other | Admitting: *Deleted

## 2023-09-18 DIAGNOSIS — I2089 Other forms of angina pectoris: Secondary | ICD-10-CM | POA: Diagnosis not present

## 2023-09-18 DIAGNOSIS — Z5189 Encounter for other specified aftercare: Secondary | ICD-10-CM | POA: Diagnosis not present

## 2023-09-18 DIAGNOSIS — I214 Non-ST elevation (NSTEMI) myocardial infarction: Secondary | ICD-10-CM

## 2023-09-18 DIAGNOSIS — I252 Old myocardial infarction: Secondary | ICD-10-CM | POA: Diagnosis not present

## 2023-09-18 NOTE — Progress Notes (Signed)
Daily Session Note  Patient Details  Name: Mike Decker MRN: [id number] Date of Birth: 10/31/1941 Referring Provider:   Flowsheet Row Cardiac Rehab from 08/21/2023 in [facility] Cardiac and Pulmonary Rehab  Referring Provider [facility]       Encounter Date: 09/18/2023  Check In:  Session Check In - 09/18/23 1133       Check-In   Supervising physician immediately available to respond to emergencies See telemetry face sheet for immediately available ER MD    Staff Present Rory Percy, MS, Exercise Physiologist;Meredith Jewel Baize, RN BSN;Joseph Shelbie Proctor, RN, ADN    Virtual Visit No    Medication changes reported     No    Fall or balance concerns reported    No    Warm-up and Cool-down Performed on first and last piece of equipment    Resistance Training Performed Yes    VAD Patient? No    PAD/SET Patient? No      Pain Assessment   Currently in Pain? No/denies                Social History   Tobacco Use  Smoking Status Former   Current packs/day: 0.00   Types: Cigarettes   Start date: 12/10/1956   Quit date: 12/10/1976   Years since quitting: 46.8  Smokeless Tobacco Never    Goals Met:  Independence with exercise equipment Exercise tolerated well No report of concerns or symptoms today Strength training completed today  Goals Unmet:  Not Applicable  Comments: Pt able to follow exercise prescription today without complaint.  Will continue to monitor for progression.    Dr. Bethann Punches is Medical Director for [facility] Cardiac Rehabilitation.  Dr. Vida Rigger is Medical Director for [facility] Pulmonary Rehabilitation.

## 2023-09-20 ENCOUNTER — Encounter: Payer: Medicare Other | Admitting: *Deleted

## 2023-09-20 DIAGNOSIS — I252 Old myocardial infarction: Secondary | ICD-10-CM | POA: Diagnosis not present

## 2023-09-20 DIAGNOSIS — I214 Non-ST elevation (NSTEMI) myocardial infarction: Secondary | ICD-10-CM

## 2023-09-20 DIAGNOSIS — I2089 Other forms of angina pectoris: Secondary | ICD-10-CM | POA: Diagnosis not present

## 2023-09-20 DIAGNOSIS — Z5189 Encounter for other specified aftercare: Secondary | ICD-10-CM | POA: Diagnosis not present

## 2023-09-20 NOTE — Progress Notes (Signed)
Daily Session Note  Patient Details  Name: Mike Decker MRN: [id number] Date of Birth: 03-13-1941 Referring Provider:   Flowsheet Row Cardiac Rehab from 08/21/2023 in [facility] Cardiac and Pulmonary Rehab  Referring Provider [facility]       Encounter Date: 09/20/2023  Check In:  Session Check In - 09/20/23 1154       Check-In   Supervising physician immediately available to respond to emergencies See telemetry face sheet for immediately available ER MD    Location [facility]-Cardiac & Pulmonary Rehab    Staff Present Rory Percy, MS, Exercise Physiologist;Izik Bingman Karleen Hampshire RN, BSN;Maxon Conetta BS, , Exercise Physiologist;Noah Tickle, BS, Exercise Physiologist    Virtual Visit No    Medication changes reported     No    Fall or balance concerns reported    No    Warm-up and Cool-down Performed on first and last piece of equipment    Resistance Training Performed Yes    VAD Patient? No    PAD/SET Patient? No      Pain Assessment   Currently in Pain? No/denies                Social History   Tobacco Use  Smoking Status Former   Current packs/day: 0.00   Types: Cigarettes   Start date: 12/10/1956   Quit date: 12/10/1976   Years since quitting: 46.8  Smokeless Tobacco Never    Goals Met:  Independence with exercise equipment Exercise tolerated well No report of concerns or symptoms today Strength training completed today  Goals Unmet:  Not Applicable  Comments: Pt able to follow exercise prescription today without complaint.  Will continue to monitor for progression.    Dr. Bethann Punches is Medical Director for [facility] Cardiac Rehabilitation.  Dr. Vida Rigger is Medical Director for [facility] Pulmonary Rehabilitation.

## 2023-09-23 ENCOUNTER — Encounter: Payer: Medicare Other | Admitting: *Deleted

## 2023-09-23 DIAGNOSIS — I252 Old myocardial infarction: Secondary | ICD-10-CM | POA: Diagnosis not present

## 2023-09-23 DIAGNOSIS — I214 Non-ST elevation (NSTEMI) myocardial infarction: Secondary | ICD-10-CM

## 2023-09-23 DIAGNOSIS — I2089 Other forms of angina pectoris: Secondary | ICD-10-CM | POA: Diagnosis not present

## 2023-09-23 DIAGNOSIS — Z5189 Encounter for other specified aftercare: Secondary | ICD-10-CM | POA: Diagnosis not present

## 2023-09-23 NOTE — Progress Notes (Signed)
Daily Session Note  Patient Details  Name: Mike Decker MRN: [id number] Date of Birth: 1941-10-22 Referring Provider:   Flowsheet Row Cardiac Rehab from 08/21/2023 in [facility] Cardiac and Pulmonary Rehab  Referring Provider [facility]       Encounter Date: 09/23/2023  Check In:  Session Check In - 09/23/23 1053       Check-In   Supervising physician immediately available to respond to emergencies See telemetry face sheet for immediately available ER MD    Location [facility]-Cardiac & Pulmonary Rehab    Staff Present Rory Percy, MS, Exercise Physiologist;Maxon Suzzette Righter, , Exercise Physiologist;Kelly Madilyn Fireman, BS, ACSM CEP, Exercise Physiologist;Lizbet Cirrincione Katrinka Blazing, RN, ADN    Virtual Visit No    Medication changes reported     No    Fall or balance concerns reported    No    Warm-up and Cool-down Performed on first and last piece of equipment    Resistance Training Performed Yes    VAD Patient? No    PAD/SET Patient? No      Pain Assessment   Currently in Pain? No/denies                Social History   Tobacco Use  Smoking Status Former   Current packs/day: 0.00   Types: Cigarettes   Start date: 12/10/1956   Quit date: 12/10/1976   Years since quitting: 46.8  Smokeless Tobacco Never    Goals Met:  Independence with exercise equipment Exercise tolerated well No report of concerns or symptoms today Strength training completed today  Goals Unmet:  Not Applicable  Comments: Pt able to follow exercise prescription today without complaint.  Will continue to monitor for progression.    Dr. Bethann Punches is Medical Director for [facility] Cardiac Rehabilitation.  Dr. Vida Rigger is Medical Director for [facility] Pulmonary Rehabilitation.

## 2023-09-25 ENCOUNTER — Encounter: Payer: Medicare Other | Admitting: *Deleted

## 2023-09-25 DIAGNOSIS — Z5189 Encounter for other specified aftercare: Secondary | ICD-10-CM | POA: Diagnosis not present

## 2023-09-25 DIAGNOSIS — I214 Non-ST elevation (NSTEMI) myocardial infarction: Secondary | ICD-10-CM

## 2023-09-25 DIAGNOSIS — I2089 Other forms of angina pectoris: Secondary | ICD-10-CM | POA: Diagnosis not present

## 2023-09-25 DIAGNOSIS — I252 Old myocardial infarction: Secondary | ICD-10-CM | POA: Diagnosis not present

## 2023-09-25 NOTE — Progress Notes (Signed)
Daily Session Note  Patient Details  Name: Mike Decker MRN: [id number] Date of Birth: [birth year] Referring Provider:   Flowsheet Row Cardiac Rehab from 08/21/2023 in [facility] Cardiac and Pulmonary Rehab  Referring Provider [facility]       Encounter Date: 09/25/2023  Check In:  Session Check In - 09/25/23 1125       Check-In   Supervising physician immediately available to respond to emergencies See telemetry face sheet for immediately available ER MD    Location [facility]-Cardiac & Pulmonary Rehab    Staff Present Cora Collum, RN, BSN, CCRP;Joseph Hood, RCP,RRT,BSRT;Maxon Pilgrim BS, , Exercise Physiologist;Akshath Mccarey Katrinka Blazing, RN, [place]    Virtual Visit No    Medication changes reported     No    Fall or balance concerns reported    No    Warm-up and Cool-down Performed on first and last piece of equipment    Resistance Training Performed Yes    VAD Patient? No    PAD/SET Patient? No      Pain Assessment   Currently in Pain? No/denies                Social History   Tobacco Use  Smoking Status Former   Current packs/day: 0.00   Types: Cigarettes   Start date: 12/10/1956   Quit date: 12/10/1976   Years since quitting: 46.8  Smokeless Tobacco Never    Goals Met:  Independence with exercise equipment Exercise tolerated well No report of concerns or symptoms today Strength training completed today  Goals Unmet:  Not Applicable  Comments: Pt able to follow exercise prescription today without complaint.  Will continue to monitor for progression.    Dr. Bethann Punches is Medical Director for [facility] Cardiac Rehabilitation.  Dr. Vida Rigger is Medical Director for [facility] Pulmonary Rehabilitation.

## 2023-09-26 DIAGNOSIS — Z85820 Personal history of malignant melanoma of skin: Secondary | ICD-10-CM | POA: Diagnosis not present

## 2023-09-26 DIAGNOSIS — D2262 Melanocytic nevi of left upper limb, including shoulder: Secondary | ICD-10-CM | POA: Diagnosis not present

## 2023-09-26 DIAGNOSIS — D2271 Melanocytic nevi of right lower limb, including hip: Secondary | ICD-10-CM | POA: Diagnosis not present

## 2023-09-26 DIAGNOSIS — D2261 Melanocytic nevi of right upper limb, including shoulder: Secondary | ICD-10-CM | POA: Diagnosis not present

## 2023-09-26 DIAGNOSIS — Z85828 Personal history of other malignant neoplasm of skin: Secondary | ICD-10-CM | POA: Diagnosis not present

## 2023-09-26 DIAGNOSIS — D225 Melanocytic nevi of trunk: Secondary | ICD-10-CM | POA: Diagnosis not present

## 2023-09-26 DIAGNOSIS — L821 Other seborrheic keratosis: Secondary | ICD-10-CM | POA: Diagnosis not present

## 2023-09-26 DIAGNOSIS — D2272 Melanocytic nevi of left lower limb, including hip: Secondary | ICD-10-CM | POA: Diagnosis not present

## 2023-09-26 DIAGNOSIS — D485 Neoplasm of uncertain behavior of skin: Secondary | ICD-10-CM | POA: Diagnosis not present

## 2023-09-27 ENCOUNTER — Encounter: Payer: Medicare Other | Admitting: *Deleted

## 2023-09-27 DIAGNOSIS — I252 Old myocardial infarction: Secondary | ICD-10-CM | POA: Diagnosis not present

## 2023-09-27 DIAGNOSIS — I2089 Other forms of angina pectoris: Secondary | ICD-10-CM | POA: Diagnosis not present

## 2023-09-27 DIAGNOSIS — I214 Non-ST elevation (NSTEMI) myocardial infarction: Secondary | ICD-10-CM

## 2023-09-27 DIAGNOSIS — Z5189 Encounter for other specified aftercare: Secondary | ICD-10-CM | POA: Diagnosis not present

## 2023-09-27 NOTE — Progress Notes (Signed)
Daily Session Note  Patient Details  Name: Mike Decker MRN: [id number] Date of Birth: 11/01/1941 Referring Provider:   Flowsheet Row Cardiac Rehab from 08/21/2023 in [facility] Cardiac and Pulmonary Rehab  Referring Provider [facility]       Encounter Date: 09/27/2023  Check In:  Session Check In - 09/27/23 1137       Check-In   Supervising physician immediately available to respond to emergencies See telemetry face sheet for immediately available ER MD    Location [facility]-Cardiac & Pulmonary Rehab    Staff Present Elige Ko, RCP,RRT,BSRT;Noah Tickle, BS, Exercise Physiologist;Lucy Woolever, RN, BSN, CCRP    Virtual Visit No    Medication changes reported     No    Warm-up and Cool-down Performed on first and last piece of equipment    Resistance Training Performed Yes    VAD Patient? No    PAD/SET Patient? No      Pain Assessment   Currently in Pain? No/denies                Social History   Tobacco Use  Smoking Status Former   Current packs/day: 0.00   Types: Cigarettes   Start date: 12/10/1956   Quit date: 12/10/1976   Years since quitting: 46.8  Smokeless Tobacco Never    Goals Met:  Independence with exercise equipment Exercise tolerated well No report of concerns or symptoms today  Goals Unmet:  Not Applicable  Comments: Pt able to follow exercise prescription today without complaint.  Will continue to monitor for progression.    Dr. Bethann Punches is Medical Director for [facility] Cardiac Rehabilitation.  Dr. Vida Rigger is Medical Director for [facility] Pulmonary Rehabilitation.

## 2023-09-29 ENCOUNTER — Other Ambulatory Visit: Payer: Self-pay

## 2023-09-29 ENCOUNTER — Emergency Department
Admission: EM | Admit: 2023-09-29 | Discharge: 2023-09-29 | Disposition: A | Discharge status: Home / Self Care | Payer: Medicare Other | Attending: Emergency Medicine | Admitting: Emergency Medicine

## 2023-09-29 ENCOUNTER — Encounter: Payer: Self-pay | Admitting: Emergency Medicine

## 2023-09-29 ENCOUNTER — Emergency Department: Payer: Medicare Other

## 2023-09-29 DIAGNOSIS — R0789 Other chest pain: Secondary | ICD-10-CM | POA: Diagnosis not present

## 2023-09-29 DIAGNOSIS — R079 Chest pain, unspecified: Secondary | ICD-10-CM | POA: Insufficient documentation

## 2023-09-29 DIAGNOSIS — I10 Essential (primary) hypertension: Secondary | ICD-10-CM | POA: Diagnosis not present

## 2023-09-29 DIAGNOSIS — R918 Other nonspecific abnormal finding of lung field: Secondary | ICD-10-CM | POA: Diagnosis not present

## 2023-09-29 LAB — TROPONIN I (HIGH SENSITIVITY)
Troponin I (High Sensitivity): 29 ng/L — ABNORMAL HIGH (ref <18)
Troponin I (High Sensitivity): 31 ng/L — ABNORMAL HIGH (ref <18)
Troponin I (High Sensitivity): 35 ng/L — ABNORMAL HIGH (ref <18)

## 2023-09-29 LAB — CBC
HCT: 40.5 % (ref 39.0–52.0)
Hemoglobin: 13 g/dL (ref 13.0–17.0)
MCH: 31.2 pg (ref 26.0–34.0)
MCHC: 32.1 g/dL (ref 30.0–36.0)
MCV: 97.1 fL (ref 80.0–100.0)
Platelets: 239 10*3/uL (ref 150–400)
RBC: 4.17 MIL/uL — ABNORMAL LOW (ref 4.22–5.81)
RDW: 14.8 % (ref 11.5–15.5)
WBC: 9.1 10*3/uL (ref 4.0–10.5)
nRBC: 0 % (ref 0.0–0.2)

## 2023-09-29 LAB — BASIC METABOLIC PANEL
Anion gap: 13 (ref 5–15)
BUN: 66 mg/dL — ABNORMAL HIGH (ref 8–23)
CO2: 24 mmol/L (ref 22–32)
Calcium: 9.1 mg/dL (ref 8.9–10.3)
Chloride: 97 mmol/L — ABNORMAL LOW (ref 98–111)
Creatinine, Ser: 2.87 mg/dL — ABNORMAL HIGH (ref 0.61–1.24)
GFR, Estimated: 21 mL/min — ABNORMAL LOW (ref >60)
Glucose, Bld: 274 mg/dL — ABNORMAL HIGH (ref 70–99)
Potassium: 3.3 mmol/L — ABNORMAL LOW (ref 3.5–5.1)
Sodium: 134 mmol/L — ABNORMAL LOW (ref 135–145)

## 2023-09-29 MED ORDER — NITROGLYCERIN 0.4 MG SL SUBL
0.4000 mg | SUBLINGUAL_TABLET | SUBLINGUAL | Status: DC | PRN
  Administered 2023-09-29 (×3): 0.4 mg via SUBLINGUAL
  Filled 2023-09-29 (×2): qty 1

## 2023-09-29 NOTE — Discharge Instructions (Signed)
Please seek medical attention for any high fevers, chest pain, shortness of breath, change in behavior, persistent vomiting, bloody stool or any other new or concerning symptoms.  

## 2023-09-29 NOTE — ED Triage Notes (Signed)
Pt to ED via [facility]  from home with c/o CP that started at 0300 this morning, per EMS pt attempted to take his home meds without relief. Per EMS pt states pain 7/10, describes as pressure, does not radiate anywhere, hx of cardiac stents.  Per EMS pt took 4 baby ASA and 3 SL nitroglycerin without relief, still c/o pain 7/10.    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

## 2023-09-29 NOTE — ED Notes (Signed)
Pt provided discharge instructions and prescription information. Pt was given the opportunity to ask questions and questions were answered. Pt instructed not to go to cardiac rehab tomorrow and the priority is to f/u with cards.

## 2023-09-29 NOTE — ED Provider Notes (Signed)
[facility] Provider Note    Event Date/Time   First MD Initiated Contact with Patient 09/29/23 1048     (approximate)   History   Chest Pain   HPI  Mike Decker is a 82 y.o. male  who presents to the emergency department today because of concern for chest pain. The patient states that it started early in the morning. Describes it as pressure. Located in his central chest there is some radiation to his left arm. Has slight accompanying shortness of breath. Denies any unusual activity or exertion yesterday. 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       Physical Exam   Triage Vital Signs: ED Triage Vitals  Encounter Vitals Group     BP 09/29/23 0454 (!) 171/60     Systolic BP Percentile --      Diastolic BP Percentile --      Pulse Rate 09/29/23 0454 75     Resp 09/29/23 0454 16     Temp 09/29/23 0454 98 F (36.7 C)     Temp Source 09/29/23 0454 Oral     SpO2 09/29/23 0454 98 %     Weight 09/29/23 0454 185 lb (83.9 kg)     Height 09/29/23 0454 5\' 6"  (1.676 m)     Head Circumference --      Peak Flow --      Pain Score 09/29/23 0458 7     Pain Loc --      Pain Education --      Exclude from Growth Chart --     Most recent vital signs: Vitals:   09/29/23 0454  BP: (!) 171/60  Pulse: 75  Resp: 16  Temp: 98 F (36.7 C)  SpO2: 98%   General: Awake, alert, oriented. CV:  Good peripheral perfusion. Regular rate and rhythm. Systolic murmur.  Resp:  Normal effort. Lungs clear. Abd:  No distention.    ED Results / Procedures / Treatments   Labs (all labs ordered are listed, but only abnormal results are displayed) Labs Reviewed  BASIC METABOLIC PANEL - Abnormal; Notable for the following components:      Result Value   Sodium 134 (*)    Potassium 3.3 (*)    Chloride 97 (*)    Glucose, Bld 274 (*)    BUN 66 (*)    Creatinine, Ser 2.87 (*)    GFR, Estimated 21 (*)    All other components within normal limits  CBC -  Abnormal; Notable for the following components:   RBC 4.17 (*)    All other components within normal limits  TROPONIN I (HIGH SENSITIVITY) - Abnormal; Notable for the following components:   Troponin I (High Sensitivity) 29 (*)    All other components within normal limits  TROPONIN I (HIGH SENSITIVITY) - Abnormal; Notable for the following components:   Troponin I (High Sensitivity) 31 (*)    All other components within normal limits  TROPONIN I (HIGH SENSITIVITY) - Abnormal; Notable for the following components:   Troponin I (High Sensitivity) 35 (*)    All other components within normal limits     EKG  I, Phineas Semen, attending physician, personally viewed and interpreted this EKG  EKG Time: 0451 Rate: 79 Rhythm: sinus rhythm with PVC Axis: normal Intervals: qtc 499 QRS: narrow ST changes: no st elevation Impression: abnormal ekg  RADIOLOGY I independently interpreted and visualized the CXR. My interpretation: No pneumonia Radiology interpretation:  IMPRESSION:  1. No radiographic evidence of acute cardiopulmonary disease.  2. The appearance of the chest suggests mild chronic bronchitis.      PROCEDURES:  Critical Care performed: No    MEDICATIONS ORDERED IN ED: Medications - No data to display   IMPRESSION / MDM / ASSESSMENT AND PLAN / ED COURSE  I reviewed the triage vital signs and the nursing notes.                              Differential diagnosis includes, but is not limited to, ACS, pneumonia, PE, costochondritis, esophagitis  Patient's presentation is most consistent with acute presentation with potential threat to life or bodily function.   The patient is on the cardiac monitor to evaluate for evidence of arrhythmia and/or significant heart rate changes.  Patient presents to the emergency department today because of concern for chest pain. Initial EKG without st elevation. CXR without concerning finding. Troponin slightly elevated but no  significant change on repeat. Patient is high risk however. Will check third troponin given that patient feels his pain is starting to come back. Repeat EKG still without ST elevation.   Pete EKG with concerning ST elevation.  Patient's third troponin essentially stable.  No significant increase that would suggest MI at this time.  This time we think it is reasonable for patient to be discharged.  Patient already has care established with cardiologist and I recommended close follow-up.      FINAL CLINICAL IMPRESSION(S) / ED DIAGNOSES   Final diagnoses:  Chest pain, unspecified type     Note:  This document was prepared using Dragon voice recognition software and may include unintentional dictation errors.    Phineas Semen, MD 09/29/23 [phone]

## 2023-10-03 DIAGNOSIS — I739 Peripheral vascular disease, unspecified: Secondary | ICD-10-CM | POA: Diagnosis not present

## 2023-10-03 DIAGNOSIS — R0602 Shortness of breath: Secondary | ICD-10-CM | POA: Diagnosis not present

## 2023-10-03 DIAGNOSIS — R079 Chest pain, unspecified: Secondary | ICD-10-CM | POA: Diagnosis not present

## 2023-10-03 DIAGNOSIS — I214 Non-ST elevation (NSTEMI) myocardial infarction: Secondary | ICD-10-CM | POA: Diagnosis not present

## 2023-10-03 DIAGNOSIS — I10 Essential (primary) hypertension: Secondary | ICD-10-CM | POA: Diagnosis not present

## 2023-10-03 DIAGNOSIS — I2510 Atherosclerotic heart disease of native coronary artery without angina pectoris: Secondary | ICD-10-CM | POA: Diagnosis not present

## 2023-10-03 DIAGNOSIS — Z8673 Personal history of transient ischemic attack (TIA), and cerebral infarction without residual deficits: Secondary | ICD-10-CM | POA: Diagnosis not present

## 2023-10-03 DIAGNOSIS — I482 Chronic atrial fibrillation, unspecified: Secondary | ICD-10-CM | POA: Diagnosis not present

## 2023-10-03 DIAGNOSIS — N184 Chronic kidney disease, stage 4 (severe): Secondary | ICD-10-CM | POA: Diagnosis not present

## 2023-10-03 DIAGNOSIS — I5032 Chronic diastolic (congestive) heart failure: Secondary | ICD-10-CM | POA: Diagnosis not present

## 2023-10-03 DIAGNOSIS — N1832 Chronic kidney disease, stage 3b: Secondary | ICD-10-CM | POA: Diagnosis not present

## 2023-10-03 DIAGNOSIS — E782 Mixed hyperlipidemia: Secondary | ICD-10-CM | POA: Diagnosis not present

## 2023-10-09 ENCOUNTER — Encounter: Payer: Self-pay | Admitting: *Deleted

## 2023-10-09 ENCOUNTER — Encounter: Payer: Medicare Other | Admitting: *Deleted

## 2023-10-09 DIAGNOSIS — I2089 Other forms of angina pectoris: Secondary | ICD-10-CM | POA: Diagnosis not present

## 2023-10-09 DIAGNOSIS — I214 Non-ST elevation (NSTEMI) myocardial infarction: Secondary | ICD-10-CM

## 2023-10-09 DIAGNOSIS — Z5189 Encounter for other specified aftercare: Secondary | ICD-10-CM | POA: Diagnosis not present

## 2023-10-09 DIAGNOSIS — I252 Old myocardial infarction: Secondary | ICD-10-CM | POA: Diagnosis not present

## 2023-10-09 NOTE — Progress Notes (Signed)
Daily Session Note  Patient Details  Name: Mike Decker MRN: [id number] Date of Birth: 1941-08-11 Referring Provider:   Flowsheet Row Cardiac Rehab from 08/21/2023 in [facility]  Referring Provider [facility]       Encounter Date: 10/09/2023  Check In:  Session Check In - 10/09/23 1111       Check-In   Supervising physician immediately available to respond to emergencies See telemetry face sheet for immediately available ER MD    Location [facility]-Cardiac & Pulmonary Rehab    Staff Present Cora Collum, RN, BSN, CCRP;Joseph Hood, RCP,RRT,BSRT;Maxon Kinder BS, , Exercise Physiologist;Usher Hedberg Katrinka Blazing, RN, [place]    Virtual Visit No    Medication changes reported     No    Fall or balance concerns reported    No    Warm-up and Cool-down Performed on first and last piece of equipment    Resistance Training Performed Yes    VAD Patient? No    PAD/SET Patient? No      Pain Assessment   Currently in Pain? No/denies                Social History   Tobacco Use  Smoking Status Former   Current packs/day: 0.00   Types: Cigarettes   Start date: 12/10/1956   Quit date: 12/10/1976   Years since quitting: 46.8  Smokeless Tobacco Never    Goals Met:  Independence with exercise equipment Exercise tolerated well No report of concerns or symptoms today Strength training completed today  Goals Unmet:  Not Applicable  Comments: Pt able to follow exercise prescription today without complaint.  Will continue to monitor for progression.    Dr. Bethann Punches is Medical Director for [facility].  Dr. Vida Rigger is Medical Director for [facility] Pulmonary Rehabilitation.

## 2023-10-09 NOTE — Progress Notes (Signed)
Cardiac Individual Treatment Plan  Patient Details  Name: Mike Decker MRN: [id number] Date of Birth: 10-Feb-1941 Referring Provider:   Flowsheet Row Cardiac Rehab from 08/21/2023 in [facility] Cardiac and Pulmonary Rehab  Referring Provider [facility]       Initial Encounter Date:  Flowsheet Row Cardiac Rehab from 08/21/2023 in [facility] Cardiac and Pulmonary Rehab  Date 08/21/23       Visit Diagnosis: NSTEMI (non-ST elevation myocardial infarction) [facility])  Patient's Home Medications on Admission:  Current Outpatient Medications:    acetaminophen (TYLENOL) 500 MG tablet, Take 1,000 mg by mouth every 8 (eight) hours as needed for mild pain., Disp: , Rfl:    allopurinol (ZYLOPRIM) 100 MG tablet, Take 100 mg by mouth daily., Disp: , Rfl:    atorvastatin (LIPITOR) 80 MG tablet, Take 1 tablet (80 mg total) by mouth daily., Disp: 30 tablet, Rfl: 0   azelastine (ASTELIN) 0.1 % nasal spray, USE 1 TO 2 SPRAYS IN EACH NOSTRIL TWICE A DAY, Disp: , Rfl:    calcitRIOL (ROCALTROL) 0.25 MCG capsule, Take 0.25 mcg by mouth daily., Disp: , Rfl:    cetirizine (ZYRTEC) 5 MG tablet, Take 5 mg by mouth daily., Disp: , Rfl:    citalopram (CELEXA) 10 MG tablet, Take 1 tablet (10 mg total) by mouth daily., Disp: 90 tablet, Rfl: 3   clopidogrel (PLAVIX) 75 MG tablet, TAKE 1 TABLET BY MOUTH EVERY DAY, Disp: 90 tablet, Rfl: 1   colchicine 0.6 MG tablet, Take 3 tablets (1.8 mg total) by mouth daily as needed (gout flares)., Disp: , Rfl:    [employer] VITAMIN B12 1000 MCG tablet, TAKE 1 TABLET BY MOUTH EVERY DAY, Disp: 30 tablet, Rfl: 0   diltiazem (CARDIZEM CD) 120 MG 24 hr capsule, Take 1 capsule (120 mg total) by mouth daily., Disp: 30 capsule, Rfl: 0   EPINEPHrine (EPI-PEN) 0.3 mg/0.3 mL SOAJ injection, Inject into the muscle as needed (anaphylaxis)., Disp: , Rfl:    ergocalciferol (VITAMIN D2) 1.25 MG (50000 UT) capsule, Take 50,000 Units by mouth every 30 (thirty) days., Disp: , Rfl:    ferrous sulfate 325 (65 FE) MG  EC tablet, TAKE 1 TABLET BY MOUTH EVERY DAY, Disp: 90 tablet, Rfl: 3   fluticasone (FLONASE) 50 MCG/ACT nasal spray, USE 1 SPRAY IN EACH NOSTRIL EVERY DAY, Disp: 32 g, Rfl: 6   folic acid (FOLVITE) 400 MCG tablet, Take 400 mcg by mouth daily., Disp: , Rfl:    hydrALAZINE (APRESOLINE) 50 MG tablet, Take 50 mg by mouth 2 (two) times daily., Disp: , Rfl:    insulin aspart (NOVOLOG) 100 UNIT/ML FlexPen, 10 UNITS IN AM, 30 UNITS AT LUNCH, 30 UNITS AT DINNER, Disp: , Rfl:    insulin glargine (LANTUS) 100 UNIT/ML injection, Inject 0.35 mLs (35 Units total) into the skin at bedtime. This is a decrease from your previous 45 units nightly. (Patient taking differently: Inject 54 Units into the skin at bedtime.), Disp: 10 mL, Rfl: 11   isosorbide mononitrate (IMDUR) 60 MG 24 hr tablet, Take 1 tablet (60 mg total) by mouth daily., Disp: 30 tablet, Rfl: 0   JARDIANCE 10 MG TABS tablet, Take 10 mg by mouth daily., Disp: , Rfl:    magnesium oxide (MAG-OX) 400 (240 Mg) MG tablet, Take 1 tablet by mouth daily., Disp: , Rfl:    methocarbamol (ROBAXIN) 500 MG tablet, Take 500 mg by mouth at bedtime., Disp: , Rfl: 0   metoprolol succinate (TOPROL-XL) 50 MG 24 hr tablet, Take 1  tablet by mouth daily., Disp: , Rfl:    mometasone (ASMANEX) 220 MCG/ACT inhaler, Inhale 2 puffs into the lungs daily., Disp: , Rfl:    montelukast (SINGULAIR) 10 MG tablet, TAKE 1 TABLET AT BEDTIME, Disp: 90 tablet, Rfl: 3   nitroGLYCERIN (NITROSTAT) 0.4 MG SL tablet, Place 0.4 mg under the tongue every 5 (five) minutes x 3 doses as needed for chest pain., Disp: , Rfl:    pantoprazole (PROTONIX) 40 MG tablet, Take 1 tablet (40 mg total) by mouth 2 (two) times daily before a meal., Disp: , Rfl:    ranolazine (RANEXA) 500 MG 12 hr tablet, Take 1 tablet (500 mg total) by mouth daily., Disp: 30 tablet, Rfl: 0   sodium bicarbonate 650 MG tablet, Take 650 mg by mouth 2 (two) times daily., Disp: , Rfl:    sodium fluoride (DENTA 5000 PLUS) 1.1 % CREA  dental cream, See admin instructions., Disp: , Rfl:    Tiotropium Bromide-Olodaterol (STIOLTO RESPIMAT) 2.5-2.5 MCG/ACT AERS, Inhale 1 each into the lungs daily., Disp: , Rfl:    Torsemide 40 MG TABS, Take 40 mg by mouth 2 (two) times daily., Disp: , Rfl:   Past Medical History: 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, esphageal   Chronic diastolic CHF (congestive heart failure) (HCC)    Colon polyp    Diabetes mellitus without complication (HCC)    Diverticulosis    Gout    Heart disease    Hemangioma    liver   Hyperlipidemia    Hypertension    Myocardial infarct (HCC)    Ocular hypertension    Peripheral vascular disease (HCC)    Skin cancer    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HCC)    Sleep apnea    Vitreoretinal degeneration     Tobacco Use: Social History   Tobacco Use  Smoking Status Former   Current packs/day: 0.00   Types: Cigarettes   Start date: 12/10/1956   Quit date: 12/10/1976   Years since quitting: 46.8  Smokeless Tobacco Never    Labs: Review Flowsheet  More data exists      Latest Ref Rng & Units 12/29/2019 07/18/2021 08/15/2022 08/16/2022 04/19/2023  Labs for ITP Cardiac and Pulmonary Rehab  Cholestrol 0 - 200 mg/dL 161  - - 096  -  LDL (calc) 0 - 99 mg/dL 51  - - 33  -  HDL-C >04 mg/dL 31  - - 17  -  Trlycerides <150 mg/dL 540  - - 981  -  Hemoglobin A1c 4.8 - 5.6 % - 8.3     7.5  - 6.2     Details       This result is from an external source.          Exercise Target Goals: Exercise Program Goal: Individual exercise prescription set using results from initial 6 min walk test and THRR while considering  patient's activity barriers and safety.   Exercise Prescription Goal: Initial exercise prescription builds to 30-45 minutes a day of aerobic activity, 2-3 days per week.  Home exercise guidelines will be given  to patient during program as part of exercise prescription that the participant will acknowledge.   Education: Aerobic Exercise: - Group verbal and visual presentation on the components of exercise prescription. Introduces F.I.T.T principle from  ACSM for exercise prescriptions.  Reviews F.I.T.T. principles of aerobic exercise including progression. Written material given at graduation. Flowsheet Row Cardiac Rehab from 08/21/2023 in [facility] Cardiac and Pulmonary Rehab  Education need identified 08/21/23       Education: Resistance Exercise: - Group verbal and visual presentation on the components of exercise prescription. Introduces F.I.T.T principle from ACSM for exercise prescriptions  Reviews F.I.T.T. principles of resistance exercise including progression. Written material given at graduation.    Education: Exercise & Equipment Safety: - Individual verbal instruction and demonstration of equipment use and safety with use of the equipment. Flowsheet Row Cardiac Rehab from 08/21/2023 in [facility] Cardiac and Pulmonary Rehab  Date 08/21/23  Educator MB  Instruction Review Code 1- Verbalizes Understanding       Education: Exercise Physiology & General Exercise Guidelines: - Group verbal and written instruction with models to review the exercise physiology of the cardiovascular system and associated critical values. Provides general exercise guidelines with specific guidelines to those with heart or lung disease.    Education: Flexibility, Balance, Mind/Body Relaxation: - Group verbal and visual presentation with interactive activity on the components of exercise prescription. Introduces F.I.T.T principle from ACSM for exercise prescriptions. Reviews F.I.T.T. principles of flexibility and balance exercise training including progression. Also discusses the mind body connection.  Reviews various relaxation techniques to help reduce and manage stress (i.e. Deep breathing, progressive muscle  relaxation, and visualization). Balance handout provided to take home. Written material given at graduation.   Activity Barriers & Risk Stratification:  Activity Barriers & Cardiac Risk Stratification - 08/21/23 1552       Activity Barriers & Cardiac Risk Stratification   Activity Barriers Back Problems;Other (comment);Muscular Weakness    Comments Gout flares occasionally; back problems, but tolerates    Cardiac Risk Stratification High             6 Minute Walk:  6 Minute Walk     Row Name 08/21/23 1551         6 Minute Walk   Phase Initial     Distance 890 feet     Walk Time 6 minutes     # of Rest Breaks 0     MPH 1.69     METS 1.43     RPE 13     Perceived Dyspnea  1     VO2 Peak 4.99     Symptoms Yes (comment)     Comments Fatigue in legs     Resting HR 69 bpm     Resting BP 140/70     Resting Oxygen Saturation  97 %     Exercise Oxygen Saturation  during 6 min walk 99 %     Max Ex. HR 104 bpm     Max Ex. BP 142/58     2 Minute Post BP 140/66              Oxygen Initial Assessment:   Oxygen Re-Evaluation:   Oxygen Discharge (Final Oxygen Re-Evaluation):   Initial Exercise Prescription:  Initial Exercise Prescription - 08/21/23 1500       Date of Initial Exercise RX and Referring Provider   Date 08/21/23    Referring Provider [facility]      Oxygen   Maintain Oxygen Saturation 88% or higher      Treadmill   MPH 1.4    Grade 0    Minutes 15    METs 2.07      Recumbant Bike   Level  1    RPM 50    Watts 15    Minutes 15    METs 1.43      NuStep   Level 1   T4 and T6   SPM 80    Minutes 15    METs 1.43      T5 Nustep   Level 1    SPM 80    Minutes 15    METs 1.43      Prescription Details   Frequency (times per week) 3    Duration Progress to 30 minutes of continuous aerobic without signs/symptoms of physical distress      Intensity   THRR 40-80% of Max Heartrate 96-123    Ratings of Perceived Exertion 11-13     Perceived Dyspnea 0-4      Progression   Progression Continue to progress workloads to maintain intensity without signs/symptoms of physical distress.      Resistance Training   Training Prescription Yes    Weight 4 lb    Reps 10-15             Perform Capillary Blood Glucose checks as needed.  Exercise Prescription Changes:   Exercise Prescription Changes     Row Name 08/21/23 1500 09/12/23 1600 09/25/23 1300         Response to Exercise   Blood Pressure (Admit) 140/70 110/68 142/84     Blood Pressure (Exercise) 142/58 180/62 164/62     Blood Pressure (Exit) 140/66 142/60 112/58     Heart Rate (Admit) 69 bpm 72 bpm 108 bpm     Heart Rate (Exercise) 104 bpm 105 bpm 108 bpm     Heart Rate (Exit) 72 bpm 84 bpm 83 bpm     Oxygen Saturation (Admit) 97 % -- --     Oxygen Saturation (Exercise) 99 % -- --     Oxygen Saturation (Exit) 97 % -- --     Rating of Perceived Exertion (Exercise) 13 13 15      Perceived Dyspnea (Exercise) 1 0 0     Symptoms fatigue in legs CP 3/4- resolved with rest none     Comments results -- --     Duration Progress to 30 minutes of  aerobic without signs/symptoms of physical distress Progress to 30 minutes of  aerobic without signs/symptoms of physical distress Progress to 30 minutes of  aerobic without signs/symptoms of physical distress     Intensity THRR New THRR unchanged THRR unchanged       Progression   Progression Continue to progress workloads to maintain intensity without signs/symptoms of physical distress. Continue to progress workloads to maintain intensity without signs/symptoms of physical distress. Continue to progress workloads to maintain intensity without signs/symptoms of physical distress.     Average METs 1.43 2.3 2.3       Resistance Training   Training Prescription -- Yes Yes     Weight -- 4 lb 4 lb     Reps -- 10-15 10-15       Interval Training   Interval Training -- No No       Treadmill   MPH -- 1.3 --      Grade -- 0 --     Minutes -- 15 --     METs -- 1.99 --       Recumbant Bike   Level -- 1 4     Watts -- 25 19     Minutes -- 15 15  METs -- 2.91 2.68       NuStep   Level -- 4 4     Minutes -- 15 15     METs -- -- 2.2       T5 Nustep   Level -- 3 4     Minutes -- 15 15     METs -- 2.1 2.1       Oxygen   Maintain Oxygen Saturation -- 88% or higher 88% or higher              Exercise Comments:   Exercise Comments     Row Name 08/26/23 1130           Exercise Comments First full day of exercise!  Patient was oriented to gym and equipment including functions, settings, policies, and procedures.  Patient's individual exercise prescription and treatment plan were reviewed.  All starting workloads were established based on the results of the 6 minute walk test done at initial orientation visit.  The plan for exercise progression was also introduced and progression will be customized based on patient's performance and goals.                Exercise Goals and Review:   Exercise Goals     Row Name 08/21/23 1556             Exercise Goals   Increase Physical Activity Yes       Intervention Provide advice, education, support and counseling about physical activity/exercise needs.;Develop an individualized exercise prescription for aerobic and resistive training based on initial evaluation findings, risk stratification, comorbidities and participant's personal goals.       Expected Outcomes Short Term: Attend rehab on a regular basis to increase amount of physical activity.;Long Term: Add in home exercise to make exercise part of routine and to increase amount of physical activity.;Long Term: Exercising regularly at least 3-5 days a week.       Increase Strength and Stamina Yes       Intervention Provide advice, education, support and counseling about physical activity/exercise needs.;Develop an individualized exercise prescription for aerobic and resistive training  based on initial evaluation findings, risk stratification, comorbidities and participant's personal goals.       Expected Outcomes Short Term: Increase workloads from initial exercise prescription for resistance, speed, and METs.;Short Term: Perform resistance training exercises routinely during rehab and add in resistance training at home;Long Term: Improve cardiorespiratory fitness, muscular endurance and strength as measured by increased METs and functional capacity ( )       Able to understand and use rate of perceived exertion (RPE) scale Yes       Intervention Provide education and explanation on how to use RPE scale       Expected Outcomes Short Term: Able to use RPE daily in rehab to express subjective intensity level;Long Term:  Able to use RPE to guide intensity level when exercising independently       Able to understand and use Dyspnea scale Yes       Intervention Provide education and explanation on how to use Dyspnea scale       Expected Outcomes Short Term: Able to use Dyspnea scale daily in rehab to express subjective sense of shortness of breath during exertion;Long Term: Able to use Dyspnea scale to guide intensity level when exercising independently       Knowledge and understanding of Target Heart Rate Range (THRR) Yes       Intervention Provide education and  explanation of THRR including how the numbers were predicted and where they are located for reference       Expected Outcomes Short Term: Able to state/look up THRR;Short Term: Able to use daily as guideline for intensity in rehab;Long Term: Able to use THRR to govern intensity when exercising independently       Able to check pulse independently Yes       Intervention Provide education and demonstration on how to check pulse in carotid and radial arteries.;Review the importance of being able to check your own pulse for safety during independent exercise       Expected Outcomes Short Term: Able to explain why pulse checking  is important during independent exercise;Long Term: Able to check pulse independently and accurately       Understanding of Exercise Prescription Yes       Intervention Provide education, explanation, and written materials on patient's individual exercise prescription       Expected Outcomes Short Term: Able to explain program exercise prescription;Long Term: Able to explain home exercise prescription to exercise independently                Exercise Goals Re-Evaluation :  Exercise Goals Re-Evaluation     Row Name 08/26/23 1130 09/12/23 1648 09/25/23 1401         Exercise Goal Re-Evaluation   Exercise Goals Review Able to understand and use rate of perceived exertion (RPE) scale;Able to understand and use Dyspnea scale;Knowledge and understanding of Target Heart Rate Range (THRR);Understanding of Exercise Prescription Increase Physical Activity;Increase Strength and Stamina;Understanding of Exercise Prescription Increase Physical Activity;Increase Strength and Stamina;Understanding of Exercise Prescription     Comments Reviewed RPE  and dyspnea scale, THR and program prescription with pt today.  Pt voiced understanding and was given a copy of goals to take home. Mike Decker is off to a great start in the program. He has attended his first 5 session during this review. He has already been able to increase his level on the T4 nustep from 1 to 4. We will continue to monitor his progress in the program. Mike Decker continues to do well in rehab. He has recently been able to signifigantly increase his level on the recumbent bike from level 1 to 4. He has also been able to maintain a consistent intensity on both the T4 and T5 nustep. We will continue to monitor his progress in the program.     Expected Outcomes Short: Use RPE daily to regulate intensity. Long: Follow program prescription in THR. Short: Continue to follow current exercise prescription, and progressively increase workloads. Long: Continue exercise  to improve strength and stamina. Short: Continue to follow current exercise prescription, and progressively increase workloads. Long: Continue exercise to improve strength and stamina.              Discharge Exercise Prescription (Final Exercise Prescription Changes):  Exercise Prescription Changes - 09/25/23 1300       Response to Exercise   Blood Pressure (Admit) 142/84    Blood Pressure (Exercise) 164/62    Blood Pressure (Exit) 112/58    Heart Rate (Admit) 108 bpm    Heart Rate (Exercise) 108 bpm    Heart Rate (Exit) 83 bpm    Rating of Perceived Exertion (Exercise) 15    Perceived Dyspnea (Exercise) 0    Symptoms none    Duration Progress to 30 minutes of  aerobic without signs/symptoms of physical distress    Intensity THRR unchanged  Progression   Progression Continue to progress workloads to maintain intensity without signs/symptoms of physical distress.    Average METs 2.3      Resistance Training   Training Prescription Yes    Weight 4 lb    Reps 10-15      Interval Training   Interval Training No      Recumbant Bike   Level 4    Watts 19    Minutes 15    METs 2.68      NuStep   Level 4    Minutes 15    METs 2.2      T5 Nustep   Level 4    Minutes 15    METs 2.1      Oxygen   Maintain Oxygen Saturation 88% or higher             Nutrition:  Target Goals: Understanding of nutrition guidelines, daily intake of sodium 1500mg , cholesterol 200mg , calories 30% from fat and 7% or less from saturated fats, daily to have 5 or more servings of fruits and vegetables.  Education: All About Nutrition: -Group instruction provided by verbal, written material, interactive activities, discussions, models, and posters to present general guidelines for heart healthy nutrition including fat, fiber, MyPlate, the role of sodium in heart healthy nutrition, utilization of the nutrition label, and utilization of this knowledge for meal planning. Follow up email  sent as well. Written material given at graduation. Flowsheet Row Cardiac Rehab from 08/21/2023 in [facility] Cardiac and Pulmonary Rehab  Education need identified 08/21/23       Biometrics:  Pre Biometrics - 08/21/23 1557       Pre Biometrics   Height 5' 6.2" (1.681 m)    Weight 188 lb 9.6 oz (85.5 kg)    Waist Circumference 45.5 inches    Hip Circumference 46.5 inches    Waist to Hip Ratio 0.98 %    BMI (Calculated) 30.27    Single Leg Stand 4 seconds              Nutrition Therapy Plan and Nutrition Goals:  Nutrition Therapy & Goals - 08/21/23 1559       Personal Nutrition Goals   Nutrition Goal Will meet with RD in the next 2 weeks      Intervention Plan   Intervention Prescribe, educate and counsel regarding individualized specific dietary modifications aiming towards targeted core components such as weight, hypertension, lipid management, diabetes, heart failure and other comorbidities.;Nutrition handout(s) given to patient.    Expected Outcomes Short Term Goal: Understand basic principles of dietary content, such as calories, fat, sodium, cholesterol and nutrients.;Short Term Goal: A plan has been developed with personal nutrition goals set during dietitian appointment.;Long Term Goal: Adherence to prescribed nutrition plan.             Nutrition Assessments:  MEDIFICTS Score Key: >=70 Need to make dietary changes  40-70 Heart Healthy Diet <= 40 Therapeutic Level Cholesterol Diet  Flowsheet Row Cardiac Rehab from 08/21/2023 in [facility] Cardiac and Pulmonary Rehab  Picture Your Plate Total Score on Admission 69      Picture Your Plate Scores: <40 Unhealthy dietary pattern with much room for improvement. 41-50 Dietary pattern unlikely to meet recommendations for good health and room for improvement. 51-60 More healthful dietary pattern, with some room for improvement.  >60 Healthy dietary pattern, although there may be some specific behaviors that could be  improved.    Nutrition Goals Re-Evaluation:   Nutrition  Goals Discharge (Final Nutrition Goals Re-Evaluation):   Psychosocial: Target Goals: Acknowledge presence or absence of significant depression and/or stress, maximize coping skills, provide positive support system. Participant is able to verbalize types and ability to use techniques and skills needed for reducing stress and depression.   Education: Stress, Anxiety, and Depression - Group verbal and visual presentation to define topics covered.  Reviews how body is impacted by stress, anxiety, and depression.  Also discusses healthy ways to reduce stress and to treat/manage anxiety and depression.  Written material given at graduation.   Education: Sleep Hygiene -Provides group verbal and written instruction about how sleep can affect your health.  Define sleep hygiene, discuss sleep cycles and impact of sleep habits. Review good sleep hygiene tips.    Initial Review & Psychosocial Screening:  Initial Psych Review & Screening - 08/08/23 1447       Initial Review   Current issues with Current Sleep Concerns      Family Dynamics   Good Support System? Yes   wife     Screening Interventions   Interventions To provide support and resources with identified psychosocial needs    Expected Outcomes Short Term goal: Utilizing psychosocial counselor, staff and physician to assist with identification of specific Stressors or current issues interfering with healing process. Setting desired goal for each stressor or current issue identified.;Long Term Goal: Stressors or current issues are controlled or eliminated.;Short Term goal: Identification and review with participant of any Quality of Life or Depression concerns found by scoring the questionnaire.;Long Term goal: The participant improves quality of Life and PHQ9 Scores as seen by post scores and/or verbalization of changes             Quality of Life Scores:   Quality of Life -  08/21/23 1558       Quality of Life   Select Quality of Life      Quality of Life Scores   Health/Function Pre 12.53 %    Socioeconomic Pre 24.58 %    Psych/Spiritual Pre 20.14 %    Family Pre 25.2 %    GLOBAL Pre 18.26 %            Scores of 19 and below usually indicate a poorer quality of life in these areas.  A difference of  2-3 points is a clinically meaningful difference.  A difference of 2-3 points in the total score of the Quality of Life Index has been associated with significant improvement in overall quality of life, self-image, physical symptoms, and general health in studies assessing change in quality of life.  PHQ-9: Review Flowsheet  More data exists      08/21/2023 10/29/2022 12/18/2021 09/15/2021 06/28/2021  Depression screen PHQ 2/9  Decreased Interest 0 0 0 0 0  Down, Depressed, Hopeless 0 0 0 0 0  PHQ - 2 Score 0 0 0 0 0  Altered sleeping 3 - - - -  Tired, decreased energy 2 - - - -  Change in appetite 0 - - - -  Feeling bad or failure about yourself  0 - - - -  Trouble concentrating 0 - - - -  Moving slowly or fidgety/restless 0 - - - -  Suicidal thoughts 0 - - - -  PHQ-9 Score 5 - - - -  Difficult doing work/chores Not difficult at all - - - -    Details           Interpretation of Total Score  Total Score Depression Severity:  1-4 = Minimal depression, 5-9 = Mild depression, 10-14 = Moderate depression, 15-19 = Moderately severe depression, 20-27 = Severe depression   Psychosocial Evaluation and Intervention:  Psychosocial Evaluation - 08/08/23 1513       Psychosocial Evaluation & Interventions   Interventions Relaxation education;Encouraged to exercise with the program and follow exercise prescription    Comments Mike Decker "Mike Decker" is coming to cardiac rehab post NSTEMI. 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 He has no barriers to attending the program. He wants to improve his strength and become physically able and comfortable walking around. He has some sleep concerns but stress seems to be under control. He has a good support system at home with his wife. Mike Decker is looking forward to starting rehab.    Expected Outcomes Short: Attend cardiac rehab for education and exercise.  Long: [place]lop and maintain positive self care habits    Continue Psychosocial Services  Follow up required by staff             Psychosocial Re-Evaluation:   Psychosocial Discharge (Final Psychosocial Re-Evaluation):   Vocational Rehabilitation: Provide vocational rehab assistance to qualifying candidates.   Vocational Rehab Evaluation & Intervention:  Vocational Rehab - 08/08/23 1505       Initial Vocational Rehab Evaluation & Intervention   Assessment shows need for Vocational Rehabilitation No             Education: Education Goals: Education classes will be provided on a variety of topics geared toward better understanding of heart health and risk factor modification. Participant will state understanding/return demonstration of topics presented as noted by education test scores.  Learning Barriers/Preferences:  Learning Barriers/Preferences - 08/08/23 1452       Learning Barriers/Preferences   Learning Barriers None    Learning Preferences None             General Cardiac Education Topics:  AED/CPR: - Group verbal and written instruction with the use of models to demonstrate the basic use of the AED with the basic ABC's of resuscitation.   Anatomy and Cardiac Procedures: - Group verbal and visual presentation and models provide information about basic cardiac anatomy and function. Reviews the testing methods done to diagnose heart disease and the outcomes of the test  results. Describes the treatment choices: Medical Management, Angioplasty, or Coronary Bypass Surgery for treating various heart conditions including Myocardial Infarction, Angina, Valve Disease, and Cardiac Arrhythmias.  Written material given at graduation.   Medication Safety: - Group verbal and visual instruction to review commonly prescribed medications for heart and lung disease. Reviews the medication, class of the drug, and side effects. Includes the steps to properly store meds and maintain the prescription regimen.  Written material given at graduation.   Intimacy: - Group verbal instruction through game format to discuss how heart and lung disease can affect sexual intimacy. Written material given at graduation..   Know Your Numbers and Heart Failure: - Group verbal and visual instruction to discuss disease risk factors for cardiac and pulmonary disease and treatment options.  Reviews associated critical values for Overweight/Obesity, Hypertension, Cholesterol, and Diabetes.  Discusses basics of heart failure: signs/symptoms and treatments.  Introduces Heart Failure Zone chart for action plan  for heart failure.  Written material given at graduation.   Infection Prevention: - Provides verbal and written material to individual with discussion of infection control including proper hand washing and proper equipment cleaning during exercise session. Flowsheet Row Cardiac Rehab from 08/21/2023 in [facility] Cardiac and Pulmonary Rehab  Date 08/21/23  Educator MB  Instruction Review Code 1- Verbalizes Understanding       Falls Prevention: - Provides verbal and written material to individual with discussion of falls prevention and safety. Flowsheet Row Cardiac Rehab from 08/21/2023 in [facility] Cardiac and Pulmonary Rehab  Date 08/21/23  Educator MB  Instruction Review Code 1- Verbalizes Understanding       Other: -Provides group and verbal instruction on various topics (see  comments)   Knowledge Questionnaire Score:  Knowledge Questionnaire Score - 08/21/23 1603       Knowledge Questionnaire Score   Pre Score 23/26             Core Components/Risk Factors/Patient Goals at Admission:  Personal Goals and Risk Factors at Admission - 08/21/23 1603       Core Components/Risk Factors/Patient Goals on Admission    Weight Management Yes;Weight Loss    Intervention Weight Management: Develop a combined nutrition and exercise program designed to reach desired caloric intake, while maintaining appropriate intake of nutrient and fiber, sodium and fats, and appropriate energy expenditure required for the weight goal.;Weight Management: Provide education and appropriate resources to help participant work on and attain dietary goals.;Weight Management/Obesity: Establish reasonable short term and long term weight goals.    Admit Weight 188 lb 9.6 oz (85.5 kg)    Goal Weight: Short Term 178 lb (80.7 kg)    Goal Weight: Long Term 173 lb (78.5 kg)    Expected Outcomes Short Term: Continue to assess and modify interventions until short term weight is achieved;Long Term: Adherence to nutrition and physical activity/exercise program aimed toward attainment of established weight goal;Weight Maintenance: Understanding of the daily nutrition guidelines, which includes 25-35% calories from fat, 7% or less cal from saturated fats, less than 200mg  cholesterol, less than 1.5gm of sodium, & 5 or more servings of fruits and vegetables daily;Understanding recommendations for meals to include 15-35% energy as protein, 25-35% energy from fat, 35-60% energy from carbohydrates, less than 200mg  of dietary cholesterol, 20-35 gm of total fiber daily;Weight Loss: Understanding of general recommendations for a balanced deficit meal plan, which promotes 1-2 lb weight loss per week and includes a negative energy balance of [phone] kcal/d;Understanding of distribution of calorie intake throughout the  day with the consumption of 4-5 meals/snacks    Improve shortness of breath with ADL's Yes    Intervention Provide education, individualized exercise plan and daily activity instruction to help decrease symptoms of SOB with activities of daily living.    Expected Outcomes Short Term: Improve cardiorespiratory fitness to achieve a reduction of symptoms when performing ADLs;Long Term: Be able to perform more ADLs without symptoms or delay the onset of symptoms    Diabetes Yes    Intervention Provide education about signs/symptoms and action to take for hypo/hyperglycemia.;Provide education about proper nutrition, including hydration, and aerobic/resistive exercise prescription along with prescribed medications to achieve blood glucose in normal ranges: Fasting glucose 65-99 mg/dL    Expected Outcomes Short Term: Participant verbalizes understanding of the signs/symptoms and immediate care of hyper/hypoglycemia, proper foot care and importance of medication, aerobic/resistive exercise and nutrition plan for blood glucose control.;Long Term: Attainment of HbA1C < 7%.    Heart  Failure Yes    Intervention Provide a combined exercise and nutrition program that is supplemented with education, support and counseling about heart failure. Directed toward relieving symptoms such as shortness of breath, decreased exercise tolerance, and extremity edema.    Expected Outcomes Improve functional capacity of life;Short term: Attendance in program 2-3 days a week with increased exercise capacity. Reported lower sodium intake. Reported increased fruit and vegetable intake. Reports medication compliance.;Short term: Daily weights obtained and reported for increase. Utilizing diuretic protocols set by physician.;Long term: Adoption of self-care skills and reduction of barriers for early signs and symptoms recognition and intervention leading to self-care maintenance.    Hypertension Yes    Intervention Provide education on  lifestyle modifcations including regular physical activity/exercise, weight management, moderate sodium restriction and increased consumption of fresh fruit, vegetables, and low fat dairy, alcohol moderation, and smoking cessation.;Monitor prescription use compliance.    Expected Outcomes Short Term: Continued assessment and intervention until BP is < 140/58mm HG in hypertensive participants. < 130/88mm HG in hypertensive participants with diabetes, heart failure or chronic kidney disease.;Long Term: Maintenance of blood pressure at goal levels.    Lipids Yes    Intervention Provide education and support for participant on nutrition & aerobic/resistive exercise along with prescribed medications to achieve LDL 70mg , HDL >40mg .    Expected Outcomes Short Term: Participant states understanding of desired cholesterol values and is compliant with medications prescribed. Participant is following exercise prescription and nutrition guidelines.;Long Term: Cholesterol controlled with medications as prescribed, with individualized exercise RX and with personalized nutrition plan. Value goals: LDL < 70mg , HDL > 40 mg.             Education:Diabetes - Individual verbal and written instruction to review signs/symptoms of diabetes, desired ranges of glucose level fasting, after meals and with exercise. Acknowledge that pre and post exercise glucose checks will be done for 3 sessions at entry of program. Flowsheet Row Cardiac Rehab from 08/21/2023 in [facility] Cardiac and Pulmonary Rehab  Date 08/21/23  Educator MB  Instruction Review Code 1- Verbalizes Understanding       Core Components/Risk Factors/Patient Goals Review:    Core Components/Risk Factors/Patient Goals at Discharge (Final Review):    ITP Comments:  ITP Comments     Row Name 08/08/23 1510 08/21/23 1550 08/26/23 1130 09/11/23 1009 10/09/23 1240   ITP Comments Initial phone call completed. Dx can be found in [facility] 7/12. EP orientation  scheduled for 9/11 at 1:30 pm. Completed and gym orientation. Initial ITP created and sent for review to Dr. Daniel Nones. First full day of exercise!  Patient was oriented to gym and equipment including functions, settings, policies, and procedures.  Patient's individual exercise prescription and treatment plan were reviewed.  All starting workloads were established based on the results of the 6 minute walk test done at initial orientation visit.  The plan for exercise progression was also introduced and progression will be customized based on patient's performance and goals. 30 Day review completed. Medical Director ITP review done, changes made as directed, and signed approval by Medical Director.    new to program 30 Day review completed. Medical Director ITP review done, changes made as directed, and signed approval by Medical Director.            Comments: new to program.  Recent hospitalization for angina.  Meds increased to pre NSTEMI level.

## 2023-10-11 ENCOUNTER — Encounter: Payer: Medicare Other | Attending: Internal Medicine | Admitting: *Deleted

## 2023-10-11 ENCOUNTER — Inpatient Hospital Stay: Payer: Medicare Other | Attending: Oncology

## 2023-10-11 DIAGNOSIS — Z48812 Encounter for surgical aftercare following surgery on the circulatory system: Secondary | ICD-10-CM | POA: Diagnosis not present

## 2023-10-11 DIAGNOSIS — I252 Old myocardial infarction: Secondary | ICD-10-CM | POA: Insufficient documentation

## 2023-10-11 DIAGNOSIS — N184 Chronic kidney disease, stage 4 (severe): Secondary | ICD-10-CM | POA: Insufficient documentation

## 2023-10-11 DIAGNOSIS — D631 Anemia in chronic kidney disease: Secondary | ICD-10-CM | POA: Insufficient documentation

## 2023-10-11 DIAGNOSIS — I130 Hypertensive heart and chronic kidney disease with heart failure and stage 1 through stage 4 chronic kidney disease, or unspecified chronic kidney disease: Secondary | ICD-10-CM | POA: Insufficient documentation

## 2023-10-11 DIAGNOSIS — I214 Non-ST elevation (NSTEMI) myocardial infarction: Secondary | ICD-10-CM

## 2023-10-11 LAB — CBC WITH DIFFERENTIAL (CANCER CENTER ONLY)
Abs Immature Granulocytes: 0.1 10*3/uL — ABNORMAL HIGH (ref 0.00–0.07)
Basophils Absolute: 0.1 10*3/uL (ref 0.0–0.1)
Basophils Relative: 1 %
Eosinophils Absolute: 0.6 10*3/uL — ABNORMAL HIGH (ref 0.0–0.5)
Eosinophils Relative: 6 %
HCT: 35.3 % — ABNORMAL LOW (ref 39.0–52.0)
Hemoglobin: 11.7 g/dL — ABNORMAL LOW (ref 13.0–17.0)
Immature Granulocytes: 1 %
Lymphocytes Relative: 15 %
Lymphs Abs: 1.6 10*3/uL (ref 0.7–4.0)
MCH: 31.5 pg (ref 26.0–34.0)
MCHC: 33.1 g/dL (ref 30.0–36.0)
MCV: 95.1 fL (ref 80.0–100.0)
Monocytes Absolute: 1.1 10*3/uL — ABNORMAL HIGH (ref 0.1–1.0)
Monocytes Relative: 11 %
Neutro Abs: 6.7 10*3/uL (ref 1.7–7.7)
Neutrophils Relative %: 66 %
Platelet Count: 238 10*3/uL (ref 150–400)
RBC: 3.71 MIL/uL — ABNORMAL LOW (ref 4.22–5.81)
RDW: 14.7 % (ref 11.5–15.5)
WBC Count: 10.2 10*3/uL (ref 4.0–10.5)
nRBC: 0 % (ref 0.0–0.2)

## 2023-10-11 LAB — FERRITIN: Ferritin: 110 ng/mL (ref 24–336)

## 2023-10-11 LAB — RETIC PANEL
Immature Retic Fract: 13.3 % (ref 2.3–15.9)
RBC.: 3.82 MIL/uL — ABNORMAL LOW (ref 4.22–5.81)
Retic Count, Absolute: 107.7 10*3/uL (ref 19.0–186.0)
Retic Ct Pct: 2.8 % (ref 0.4–3.1)
Reticulocyte Hemoglobin: 33.9 pg (ref >27.9)

## 2023-10-11 LAB — IRON AND TIBC
Iron: 64 ug/dL (ref 45–182)
Saturation Ratios: 19 % (ref 17.9–39.5)
TIBC: 332 ug/dL (ref 250–450)
UIBC: 268 ug/dL

## 2023-10-11 NOTE — Progress Notes (Signed)
Daily Session Note  Patient Details  Name: Mike Decker MRN: [id number] Date of Birth: 19-Oct-1941 Referring Provider:   Flowsheet Row Cardiac Rehab from 08/21/2023 in [facility] Cardiac and Pulmonary Rehab  Referring Provider [facility]       Encounter Date: 10/11/2023  Check In:  Session Check In - 10/11/23 1113       Check-In   Supervising physician immediately available to respond to emergencies See telemetry face sheet for immediately available ER MD    Location [facility]-Cardiac & Pulmonary Rehab    Staff Present Ronette Deter, BS, Exercise Physiologist;Joseph Shelbie Proctor, RN, [place]    Virtual Visit No    Medication changes reported     No    Fall or balance concerns reported    No    Warm-up and Cool-down Performed on first and last piece of equipment    Resistance Training Performed Yes    VAD Patient? No    PAD/SET Patient? No      Pain Assessment   Currently in Pain? No/denies                Social History   Tobacco Use  Smoking Status Former   Current packs/day: 0.00   Types: Cigarettes   Start date: 12/10/1956   Quit date: 12/10/1976   Years since quitting: 46.8  Smokeless Tobacco Never    Goals Met:  Independence with exercise equipment Exercise tolerated well No report of concerns or symptoms today Strength training completed today  Goals Unmet:  Not Applicable  Comments: Pt able to follow exercise prescription today without complaint.  Will continue to monitor for progression.    Dr. Bethann Punches is Medical Director for [facility] Cardiac Rehabilitation.  Dr. Vida Rigger is Medical Director for [facility] Pulmonary Rehabilitation.

## 2023-10-14 ENCOUNTER — Encounter: Payer: Medicare Other | Admitting: *Deleted

## 2023-10-14 DIAGNOSIS — I214 Non-ST elevation (NSTEMI) myocardial infarction: Secondary | ICD-10-CM

## 2023-10-14 DIAGNOSIS — I252 Old myocardial infarction: Secondary | ICD-10-CM | POA: Diagnosis not present

## 2023-10-14 DIAGNOSIS — Z48812 Encounter for surgical aftercare following surgery on the circulatory system: Secondary | ICD-10-CM | POA: Diagnosis not present

## 2023-10-14 NOTE — Progress Notes (Signed)
Daily Session Note  Patient Details  Name: Mike Decker MRN: [id number] Date of Birth: Mar 07, 1941 Referring Provider:   Flowsheet Row Cardiac Rehab from 08/21/2023 in [facility] Cardiac and Pulmonary Rehab  Referring Provider [facility]       Encounter Date: 10/14/2023  Check In:  Session Check In - 10/14/23 1129       Check-In   Supervising physician immediately available to respond to emergencies See telemetry face sheet for immediately available ER MD    Location [facility]-Cardiac & Pulmonary Rehab    Staff Present Cora Collum, RN, BSN, CCRP;Meredith Jewel Baize, RN Mabeline Caras, BS, ACSM CEP, Exercise Physiologist;Maxon Conetta BS, , Exercise Physiologist    Virtual Visit No    Medication changes reported     No    Fall or balance concerns reported    No    Warm-up and Cool-down Performed on first and last piece of equipment    Resistance Training Performed Yes    VAD Patient? No    PAD/SET Patient? No      Pain Assessment   Currently in Pain? No/denies                Social History   Tobacco Use  Smoking Status Former   Current packs/day: 0.00   Types: Cigarettes   Start date: 12/10/1956   Quit date: 12/10/1976   Years since quitting: 46.8  Smokeless Tobacco Never    Goals Met:  Independence with exercise equipment Exercise tolerated well No report of concerns or symptoms today  Goals Unmet:  Not Applicable  Comments: Pt able to follow exercise prescription today without complaint.  Will continue to monitor for progression.    Dr. Bethann Punches is Medical Director for [facility] Cardiac Rehabilitation.  Dr. Vida Rigger is Medical Director for [facility] Pulmonary Rehabilitation.

## 2023-10-15 ENCOUNTER — Ambulatory Visit: Payer: Medicare Other

## 2023-10-15 ENCOUNTER — Ambulatory Visit: Payer: Medicare Other | Admitting: Oncology

## 2023-10-16 ENCOUNTER — Inpatient Hospital Stay (HOSPITAL_BASED_OUTPATIENT_CLINIC_OR_DEPARTMENT_OTHER): Payer: Medicare Other | Admitting: Oncology

## 2023-10-16 ENCOUNTER — Encounter: Payer: Medicare Other | Admitting: *Deleted

## 2023-10-16 ENCOUNTER — Inpatient Hospital Stay: Payer: Medicare Other

## 2023-10-16 ENCOUNTER — Encounter: Payer: Self-pay | Admitting: Oncology

## 2023-10-16 VITALS — BP 139/50 | HR 74 | Temp 98.6°F | Resp 18 | Ht 66.0 in | Wt 193.9 lb

## 2023-10-16 DIAGNOSIS — E538 Deficiency of other specified B group vitamins: Secondary | ICD-10-CM | POA: Diagnosis not present

## 2023-10-16 DIAGNOSIS — N184 Chronic kidney disease, stage 4 (severe): Secondary | ICD-10-CM | POA: Diagnosis not present

## 2023-10-16 DIAGNOSIS — Z48812 Encounter for surgical aftercare following surgery on the circulatory system: Secondary | ICD-10-CM | POA: Diagnosis not present

## 2023-10-16 DIAGNOSIS — I214 Non-ST elevation (NSTEMI) myocardial infarction: Secondary | ICD-10-CM

## 2023-10-16 DIAGNOSIS — I130 Hypertensive heart and chronic kidney disease with heart failure and stage 1 through stage 4 chronic kidney disease, or unspecified chronic kidney disease: Secondary | ICD-10-CM | POA: Diagnosis not present

## 2023-10-16 DIAGNOSIS — I252 Old myocardial infarction: Secondary | ICD-10-CM | POA: Diagnosis not present

## 2023-10-16 DIAGNOSIS — D631 Anemia in chronic kidney disease: Secondary | ICD-10-CM | POA: Diagnosis not present

## 2023-10-16 NOTE — Progress Notes (Signed)
Hematology/Oncology Follow Up Note [facility]  [phone] [phone] REASON FOR VISIT follow-up for management of iron deficiency anemia, anemia and CKD,   ASSESSMENT & PLAN:   Anemia of chronic renal failure, stage 4 (severe) (HCC) #Anemia in CKD Labs are reviewed and are discussed with patient Lab Results  Component Value Date   HGB 11.7 (L) 10/11/2023   TIBC 332 10/11/2023   IRONPCTSAT 19 10/11/2023   FERRITIN 110 10/11/2023   Stable hemoglobin.  I will hold off IV Venofer.  Continue ferrous sulfate 325mg  daily. .  Vitamin B12 deficiency History of Vitamin B12 deficiency, negative intrinsic factor antibody and antiparital antibody.  continue oral vitamin B12 supplementation 2 times per week Check B12 at next visit.    Orders Placed This Encounter  Procedures   CBC with Differential (Cancer Center Only)    Standing Status:   Future    Standing Expiration Date:   10/15/2024   Ferritin    Standing Status:   Future    Standing Expiration Date:   10/15/2024   Vitamin B12    Standing Status:   Future    Standing Expiration Date:   10/15/2024   Iron and TIBC    Standing Status:   Future    Standing Expiration Date:   10/15/2024   Retic Panel    Standing Status:   Future    Standing Expiration Date:   10/15/2024  Follow-up in 4 months.  All questions were answered. The patient knows to call the clinic with any problems, questions or concerns.  Rickard Patience, MD, PhD [facility] Hematology Oncology 10/16/2023     PERTINENT HEMATOLOGY Oncology HISTORY received IV Venofer daily x3 as well as multiple units of PRBC transfusion. GI was consulted and patient had EGD done during his hospitalization.  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  Treated with argon plasma coagulation.  Colonoscopy showed polyps which were removed. Patient was discharged on 12/15/2018.  # 03/23/2019 mammogram showed gynecomastia.  #History of  cutaneous melanoma status post Mohs surgery. 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 95% stenosis in the left circumflex status post drug-eluting stent. Patient is on aspirin and Plavix prior to this cardiac event. 04/18/2020, patient has a ER visit due to chest pain. Troponin was negative. Pain improved with medication. Patient was discharged with outpatient cardiology follow-up.  hospitalization 12/09/2022 in [facility] house due to chest pain/non-STEMI, started on heparin drip.  Seen by cardiology Dr. Juliann Pares.    INTERVAL HISTORY 82 y.o. male with PMH listed as below present to follow-up for anemia of CKD.  He follows up with cardiology for CHF.    Patient currently on Plavix. Today he denies chest pain, chronic shortness of breath at baseline. He feels tired.   Review of Systems  Constitutional:  Positive for fatigue. Negative for appetite change, chills, fever and unexpected weight change.  HENT:   Negative for hearing loss and voice change.   Eyes:  Negative for eye problems and icterus.  Respiratory:  Positive for shortness of breath. Negative for chest tightness and cough.   Cardiovascular:  Negative for chest pain and leg swelling.  Gastrointestinal:  Negative for abdominal distention and abdominal pain.  Endocrine: Negative for hot flashes.  Genitourinary:  Negative for difficulty urinating, dysuria and frequency.   Musculoskeletal:  Negative for arthralgias.  Skin:  Negative for itching  and rash.  Neurological:  Negative for light-headedness and numbness.  Hematological:  Negative for adenopathy. Bruises/bleeds easily.  Psychiatric/Behavioral:  Negative for confusion.     Allergies  Allergen Reactions   Gabapentin     Other reaction(s): Other (See Comments) Tremors   Peanut-Containing Drug Products Anaphylaxis   Penicillins Hives and Rash    Has patient had a  PCN reaction causing immediate rash, facial/tongue/throat swelling, SOB or lightheadedness with hypotension: Yes Has patient had a PCN reaction causing severe rash involving mucus membranes or skin necrosis: No Has patient had a PCN reaction that required hospitalization No Has patient had a PCN reaction occurring within the last 10 years: No If all of the above answers are "NO", then may proceed with Cephalosporin use.   Bee Venom Swelling   Influenza Vaccines Hives   Inh [Isoniazid] Hives   Jardiance [Empagliflozin] Other (See Comments)    Diarrhea   Kenalog [Triamcinolone Acetonide] Hives   Levaquin [Levofloxacin] Other (See Comments)    Tendon, ligament pain.    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, esphageal   Chronic diastolic CHF (congestive heart failure) (HCC)    Colon polyp    Diabetes mellitus without complication (HCC)    Diverticulosis    Gout    Heart disease    Hemangioma    liver   Hyperlipidemia    Hypertension    Myocardial infarct [facility])    Ocular hypertension    Peripheral vascular disease (HCC)    Skin cancer    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HCC)    Sleep apnea    Vitreoretinal degeneration      Past Surgical History:  Procedure Laterality Date   CATARACT EXTRACTION  2011, 2012   COLONOSCOPY N/A 12/14/2018   Procedure: COLONOSCOPY;  Surgeon: Pasty Spillers, MD;  Location: [facility] ENDOSCOPY;  Service: Endoscopy;  Laterality: N/A;   CORONARY ANGIOPLASTY WITH STENT PLACEMENT  12/10/2010   CORONARY STENT INTERVENTION N/A 12/29/2019   Procedure: CORONARY STENT INTERVENTION;  Surgeon: Yvonne Kendall, MD;  Location: [facility] INVASIVE CV LAB;  Service:  Cardiovascular;  Laterality: N/A;   ESOPHAGOGASTRODUODENOSCOPY N/A 12/14/2018   Procedure: ESOPHAGOGASTRODUODENOSCOPY (EGD);  Surgeon: Pasty Spillers, MD;  Location[facility] ENDOSCOPY;  Service: Endoscopy;  Laterality: N/A;   ESOPHAGOGASTRODUODENOSCOPY (EGD) WITH PROPOFOL N/A 06/01/2016   Procedure: ESOPHAGOGASTRODUODENOSCOPY (EGD) WITH PROPOFOL;  Surgeon: Scot Jun, MD;  Location: [facility] ENDOSCOPY;  Service: Endoscopy;  Laterality: N/A;   ESOPHAGOGASTRODUODENOSCOPY (EGD) WITH PROPOFOL N/A 01/31/2023   Procedure: ESOPHAGOGASTRODUODENOSCOPY (EGD) WITH PROPOFOL;  Surgeon: Midge Minium, MD;  Location: [facility] ENDOSCOPY;  Service: Endoscopy;  Laterality: N/A;   FEMORAL ARTERY STENT Right    HERNIA REPAIR     x2   INTRAOCULAR LENS INSERTION     LEFT HEART CATH AND CORONARY ANGIOGRAPHY N/A 05/09/2017   Procedure: Left Heart Cath and Coronary Angiography;  Surgeon: Alwyn Pea, MD;  Location: [facility] INVASIVE CV LAB;  Service: Cardiovascular;  Laterality: N/A;   LEFT HEART CATH AND CORONARY ANGIOGRAPHY N/A 12/08/2018   Procedure: LEFT HEART CATH AND CORONARY ANGIOGRAPHY and  possible PCI and stent;  Surgeon: Alwyn Pea, MD;  Location: [facility] INVASIVE CV LAB;  Service: Cardiovascular;  Laterality: N/A;   LEFT HEART CATH AND CORONARY ANGIOGRAPHY N/A 12/29/2019   Procedure: LEFT HEART CATH AND CORONARY ANGIOGRAPHY;  Surgeon: Yvonne Kendall, MD;  Location: [facility] INVASIVE CV LAB;  Service: Cardiovascular;  Laterality: N/A;   LEFT HEART CATH AND CORONARY ANGIOGRAPHY Left 12/13/2022   Procedure: LEFT HEART CATH AND CORONARY ANGIOGRAPHY;  Surgeon: Alwyn Pea, MD;  Location: [facility] INVASIVE CV LAB;  Service: Cardiovascular;  Laterality: Left;   NOSE SURGERY     submucous resection   PROSTATE SURGERY  12/10/2000   ROTATOR CUFF REPAIR Right     Social History   Socioeconomic History   Marital status: Married    Spouse name: Not on file   Number of children: 2   Years of education:  College 3 years   Highest education level: Associate degree: occupational, Scientist, product/process development, or vocational program  Occupational History   Occupation: retired  Tobacco Use   Smoking status: Former    Current packs/day: 0.00    Types: Cigarettes    Start date: 12/10/1956    Quit date: 12/10/1976    Years since quitting: 46.8   Smokeless tobacco: Never  Vaping Use   Vaping status: Never Used  Substance and Sexual Activity   Alcohol use: No   Drug use: No   Sexual activity: Not Currently  Other Topics Concern   Not on file  Social History Narrative   Not on file   Social Determinants of Health   Financial Resource Strain: Low Risk  (06/21/2023)   Received from [facility]   Overall Financial Resource Strain (CARDIA)    Difficulty of Paying Living Expenses: Not very hard  Food Insecurity: No Food Insecurity (06/21/2023)   [facility]   Hunger Vital Sign    Worried About Running Out of Food in the Last Year: Never true    Ran Out of Food in the Last Year: Never true  Transportation Needs: No Transportation Needs (06/21/2023)   Received from [facility] - Transportation    In the past 12 months, has lack of transportation kept you from medical appointments or from getting medications?: No    Lack of Transportation (Non-Medical): No  Physical Activity: Insufficiently Active (09/15/2021)   Exercise Vital Sign    Days of Exercise per Week: 7 days    Minutes of Exercise per Session: 20 min  Stress: No Stress Concern Present (09/15/2021)   [employer] of Occupational Health - Occupational Stress Questionnaire    Feeling of Stress : Not at all  Social Connections: Moderately Isolated (09/15/2021)   Social Connection and Isolation Panel [NHANES]    Frequency of Communication with Friends and Family: Once a week    Frequency of Social Gatherings with Friends and Family: Once a week    Attends Religious Services:  More than 4 times per year    Active Member of [employer] or Organizations: No    Attends Banker Meetings: Never    Marital Status: Married  Catering manager Violence: Not At Risk (04/18/2023)   Humiliation, Afraid, Rape, and Kick questionnaire    Fear of Current or Ex-Partner: No    Emotionally Abused: No    Physically Abused: No    Sexually Abused: No    Family History  Problem Relation Age of Onset   Arthritis Mother  Stroke Maternal Grandfather    Breast cancer Neg Hx      Current Outpatient Medications:    acetaminophen (TYLENOL) 500 MG tablet, Take 1,000 mg by mouth every 8 (eight) hours as needed for mild pain., Disp: , Rfl:    allopurinol (ZYLOPRIM) 100 MG tablet, Take 100 mg by mouth daily., Disp: , Rfl:    atorvastatin (LIPITOR) 80 MG tablet, Take 1 tablet (80 mg total) by mouth daily., Disp: 30 tablet, Rfl: 0   azelastine (ASTELIN) 0.1 % nasal spray, USE 1 TO 2 SPRAYS IN EACH NOSTRIL TWICE A DAY, Disp: , Rfl:    calcitRIOL (ROCALTROL) 0.25 MCG capsule, Take 0.25 mcg by mouth daily., Disp: , Rfl:    cetirizine (ZYRTEC) 5 MG tablet, Take 5 mg by mouth daily., Disp: , Rfl:    citalopram (CELEXA) 10 MG tablet, Take 1 tablet (10 mg total) by mouth daily., Disp: 90 tablet, Rfl: 3   clopidogrel (PLAVIX) 75 MG tablet, TAKE 1 TABLET BY MOUTH EVERY DAY, Disp: 90 tablet, Rfl: 1   colchicine 0.6 MG tablet, Take 3 tablets (1.8 mg total) by mouth daily as needed (gout flares)., Disp: , Rfl:    [employer] VITAMIN B12 1000 MCG tablet, TAKE 1 TABLET BY MOUTH EVERY DAY, Disp: 30 tablet, Rfl: 0   diltiazem (CARDIZEM CD) 120 MG 24 hr capsule, Take 1 capsule (120 mg total) by mouth daily., Disp: 30 capsule, Rfl: 0   EPINEPHrine (EPI-PEN) 0.3 mg/0.3 mL SOAJ injection, Inject into the muscle as needed (anaphylaxis)., Disp: , Rfl:    ergocalciferol (VITAMIN D2) 1.25 MG (50000 UT) capsule, Take 50,000 Units by mouth every 30 (thirty) days., Disp: , Rfl:    ferrous sulfate 325 (65 FE) MG EC  tablet, TAKE 1 TABLET BY MOUTH EVERY DAY, Disp: 90 tablet, Rfl: 3   fluticasone (FLONASE) 50 MCG/ACT nasal spray, USE 1 SPRAY IN EACH NOSTRIL EVERY DAY, Disp: 32 g, Rfl: 6   folic acid (FOLVITE) 400 MCG tablet, Take 400 mcg by mouth daily., Disp: , Rfl:    hydrALAZINE (APRESOLINE) 50 MG tablet, Take 50 mg by mouth 2 (two) times daily., Disp: , Rfl:    insulin aspart (NOVOLOG) 100 UNIT/ML FlexPen, 10 UNITS IN AM, 30 UNITS AT LUNCH, 30 UNITS AT DINNER, Disp: , Rfl:    insulin glargine (LANTUS) 100 UNIT/ML injection, Inject 0.35 mLs (35 Units total) into the skin at bedtime. This is a decrease from your previous 45 units nightly. (Patient taking differently: Inject 54 Units into the skin at bedtime.), Disp: 10 mL, Rfl: 11   isosorbide mononitrate (IMDUR) 60 MG 24 hr tablet, Take 1 tablet (60 mg total) by mouth daily. (Patient taking differently: Take 120 mg by mouth daily.), Disp: 30 tablet, Rfl: 0   JARDIANCE 10 MG TABS tablet, Take 10 mg by mouth daily., Disp: , Rfl:    magnesium oxide (MAG-OX) 400 (240 Mg) MG tablet, Take 1 tablet by mouth daily., Disp: , Rfl:    methocarbamol (ROBAXIN) 500 MG tablet, Take 500 mg by mouth at bedtime., Disp: , Rfl: 0   metoprolol succinate (TOPROL-XL) 50 MG 24 hr tablet, Take 1 tablet by mouth daily., Disp: , Rfl:    mometasone (ASMANEX) 220 MCG/ACT inhaler, Inhale 2 puffs into the lungs daily., Disp: , Rfl:    montelukast (SINGULAIR) 10 MG tablet, TAKE 1 TABLET AT BEDTIME, Disp: 90 tablet, Rfl: 3   nitroGLYCERIN (NITROSTAT) 0.4 MG SL tablet, Place 0.4 mg under the tongue every 5 (five)  minutes x 3 doses as needed for chest pain., Disp: , Rfl:    pantoprazole (PROTONIX) 40 MG tablet, Take 1 tablet (40 mg total) by mouth 2 (two) times daily before a meal., Disp: , Rfl:    ranolazine (RANEXA) 500 MG 12 hr tablet, Take 1 tablet (500 mg total) by mouth daily. (Patient taking differently: Take 1,000 mg by mouth daily.), Disp: 30 tablet, Rfl: 0   sodium bicarbonate 650 MG  tablet, Take 650 mg by mouth 2 (two) times daily., Disp: , Rfl:    sodium fluoride (DENTA 5000 PLUS) 1.1 % CREA dental cream, See admin instructions., Disp: , Rfl:    Tiotropium Bromide-Olodaterol (STIOLTO RESPIMAT) 2.5-2.5 MCG/ACT AERS, Inhale 1 each into the lungs daily., Disp: , Rfl:    Torsemide 40 MG TABS, Take 40 mg by mouth 2 (two) times daily., Disp: , Rfl:   Physical exam:  Vitals:   10/16/23 1317  BP: (!) 139/50  Pulse: 74  Resp: 18  Temp: 98.6 F (37 C)  TempSrc: Tympanic  SpO2: 97%  Weight: 193 lb 14.4 oz (88 kg)  Height: 5\' 6"  (1.676 m)   Physical Exam Constitutional:      General: He is not in acute distress.    Appearance: He is obese.  HENT:     Head: Normocephalic and atraumatic.  Eyes:     General: No scleral icterus.    Pupils: Pupils are equal, round, and reactive to light.  Cardiovascular:     Rate and Rhythm: Normal rate and regular rhythm.     Heart sounds: Normal heart sounds.  Pulmonary:     Effort: Pulmonary effort is normal. No respiratory distress.     Breath sounds: No wheezing.  Abdominal:     General: Bowel sounds are normal. There is no distension.     Palpations: Abdomen is soft. There is no mass.     Tenderness: There is no abdominal tenderness.  Musculoskeletal:        General: No deformity. Normal range of motion.     Cervical back: Normal range of motion and neck supple.  Skin:    General: Skin is warm and dry.     Findings: No erythema or rash.  Neurological:     Mental Status: He is alert and oriented to person, place, and time. Mental status is at baseline.     Cranial Nerves: No cranial nerve deficit.     Coordination: Coordination normal.  Psychiatric:        Mood and Affect: Mood normal.        Latest Ref Rng & Units 09/29/2023    5:02 AM  CMP  Glucose 70 - 99 mg/dL 132   BUN 8 - 23 mg/dL 66   Creatinine 4.40 - 1.24 mg/dL 1.02   Sodium 725 - 366 mmol/L 134   Potassium 3.5 - 5.1 mmol/L 3.3   Chloride 98 - 111  mmol/L 97   CO2 22 - 32 mmol/L 24   Calcium 8.9 - 10.3 mg/dL 9.1       Latest Ref Rng & Units 10/11/2023   12:40 PM  CBC  WBC 4.0 - 10.5 K/uL 10.2   Hemoglobin 13.0 - 17.0 g/dL 44.0   Hematocrit 34.7 - 52.0 % 35.3   Platelets 150 - 400 K/uL 238     DG Chest 2 View  Result Date: 09/29/2023 CLINICAL DATA:  81 year old male with history of chest pain. EXAM: CHEST - 2 VIEW COMPARISON:  Chest x-ray  04/18/2023. FINDINGS: Lung volumes are normal. Mild diffuse interstitial prominence and peribronchial cuffing, less severe than prior studies, but apparently chronic. No consolidative airspace disease. No pleural effusions. No pneumothorax. No pulmonary nodule or mass noted. Pulmonary vasculature and the cardiomediastinal silhouette are within normal limits. IMPRESSION: 1. No radiographic evidence of acute cardiopulmonary disease. 2. The appearance of the chest suggests mild chronic bronchitis. Electronically Signed   By: Trudie Reed M.D.   On: 09/29/2023 06:50

## 2023-10-16 NOTE — Progress Notes (Signed)
Daily Session Note  Patient Details  Name: Mike Decker MRN: [id number] Date of Birth: April 05, 1941 Referring Provider:   Flowsheet Row Cardiac Rehab from 08/21/2023 in [facility] Cardiac and Pulmonary Rehab  Referring Provider [facility]       Encounter Date: 10/16/2023  Check In:  Session Check In - 10/16/23 1107       Check-In   Supervising physician immediately available to respond to emergencies See telemetry face sheet for immediately available ER MD    Location [facility]-Cardiac & Pulmonary Rehab    Staff Present Ronette Deter, BS, Exercise Physiologist;Joseph Grindstone, Guinevere Ferrari, RN, ADN;[employer], , Exercise Physiologist    Virtual Visit No    Medication changes reported     No    Fall or balance concerns reported    No    Warm-up and Cool-down Performed on first and last piece of equipment    Resistance Training Performed Yes    VAD Patient? No    PAD/SET Patient? No      Pain Assessment   Currently in Pain? No/denies                Social History   Tobacco Use  Smoking Status Former   Current packs/day: 0.00   Types: Cigarettes   Start date: 12/10/1956   Quit date: 12/10/1976   Years since quitting: 46.8  Smokeless Tobacco Never    Goals Met:  Independence with exercise equipment Exercise tolerated well No report of concerns or symptoms today Strength training completed today  Goals Unmet:  Not Applicable  Comments: Pt able to follow exercise prescription today without complaint.  Will continue to monitor for progression.    Dr. Bethann Punches is Medical Director for [facility] Cardiac Rehabilitation.  Dr. Vida Rigger is Medical Director for [facility] Pulmonary Rehabilitation.

## 2023-10-16 NOTE — Assessment & Plan Note (Addendum)
History of Vitamin B12 deficiency, negative intrinsic factor antibody and antiparital antibody.  continue oral vitamin B12 supplementation 2 times per week Check B12 at next visit.

## 2023-10-16 NOTE — Assessment & Plan Note (Signed)
#  Anemia in CKD Labs are reviewed and are discussed with patient Lab Results  Component Value Date   HGB 11.7 (L) 10/11/2023   TIBC 332 10/11/2023   IRONPCTSAT 19 10/11/2023   FERRITIN 110 10/11/2023   Stable hemoglobin.  I will hold off IV Venofer.  Continue ferrous sulfate 325mg  daily. Marland Kitchen

## 2023-10-18 ENCOUNTER — Encounter: Payer: Medicare Other | Admitting: *Deleted

## 2023-10-18 DIAGNOSIS — Z48812 Encounter for surgical aftercare following surgery on the circulatory system: Secondary | ICD-10-CM | POA: Diagnosis not present

## 2023-10-18 DIAGNOSIS — I252 Old myocardial infarction: Secondary | ICD-10-CM | POA: Diagnosis not present

## 2023-10-18 DIAGNOSIS — I214 Non-ST elevation (NSTEMI) myocardial infarction: Secondary | ICD-10-CM

## 2023-10-18 NOTE — Progress Notes (Signed)
Daily Session Note  Patient Details  Name: Mike Decker MRN: [id number] Date of Birth: Apr 29, 1941 Referring Provider:   Flowsheet Row Cardiac Rehab from 08/21/2023 in [facility] Cardiac and Pulmonary Rehab  Referring Provider [facility]       Encounter Date: 10/18/2023  Check In:  Session Check In - 10/18/23 1121       Check-In   Supervising physician immediately available to respond to emergencies See telemetry face sheet for immediately available ER MD    Location [facility]-Cardiac & Pulmonary Rehab    Staff Present Cora Collum, RN, BSN, CCRP;Joseph Hood, RCP,RRT,BSRT;Noah Tickle, [place], Exercise Physiologist    Virtual Visit No    Medication changes reported     No    Fall or balance concerns reported    No    Warm-up and Cool-down Performed on first and last piece of equipment    Resistance Training Performed Yes    VAD Patient? No    PAD/SET Patient? No      Pain Assessment   Currently in Pain? No/denies                Social History   Tobacco Use  Smoking Status Former   Current packs/day: 0.00   Types: Cigarettes   Start date: 12/10/1956   Quit date: 12/10/1976   Years since quitting: 46.8  Smokeless Tobacco Never    Goals Met:  Independence with exercise equipment Exercise tolerated well No report of concerns or symptoms today  Goals Unmet:  Not Applicable  Comments: Pt able to follow exercise prescription today without complaint.  Will continue to monitor for progression.    Dr. Bethann Punches is Medical Director for [facility] Cardiac Rehabilitation.  Dr. Vida Rigger is Medical Director for [facility] Pulmonary Rehabilitation.

## 2023-10-21 ENCOUNTER — Encounter: Payer: Medicare Other | Admitting: *Deleted

## 2023-10-21 DIAGNOSIS — I214 Non-ST elevation (NSTEMI) myocardial infarction: Secondary | ICD-10-CM

## 2023-10-21 DIAGNOSIS — Z48812 Encounter for surgical aftercare following surgery on the circulatory system: Secondary | ICD-10-CM | POA: Diagnosis not present

## 2023-10-21 DIAGNOSIS — I252 Old myocardial infarction: Secondary | ICD-10-CM | POA: Diagnosis not present

## 2023-10-21 NOTE — Progress Notes (Signed)
Daily Session Note  Patient Details  Name: Mike Decker MRN: [id number] Date of Birth: 22-Feb-1941 Referring Provider:   Flowsheet Row Cardiac Rehab from 08/21/2023 in [facility] Cardiac and Pulmonary Rehab  Referring Provider [facility]       Encounter Date: 10/21/2023  Check In:  Session Check In - 10/21/23 1115       Check-In   Supervising physician immediately available to respond to emergencies See telemetry face sheet for immediately available ER MD    Location [facility]-Cardiac & Pulmonary Rehab    Staff Present Cora Collum, RN, BSN, CCRP;Meredith Jewel Baize, RN Mabeline Caras, BS, ACSM CEP, Exercise Physiologist;Addalie Calles Katrinka Blazing, RN, ADN    Virtual Visit No    Medication changes reported     No    Fall or balance concerns reported    No    Warm-up and Cool-down Performed on first and last piece of equipment    Resistance Training Performed Yes    VAD Patient? No    PAD/SET Patient? No      Pain Assessment   Currently in Pain? No/denies                Social History   Tobacco Use  Smoking Status Former   Current packs/day: 0.00   Types: Cigarettes   Start date: 12/10/1956   Quit date: 12/10/1976   Years since quitting: 46.8  Smokeless Tobacco Never    Goals Met:  Independence with exercise equipment Exercise tolerated well No report of concerns or symptoms today Strength training completed today  Goals Unmet:  Not Applicable  Comments: Pt able to follow exercise prescription today without complaint.  Will continue to monitor for progression.    Dr. Bethann Punches is Medical Director for [facility] Cardiac Rehabilitation.  Dr. Vida Rigger is Medical Director for [facility] Pulmonary Rehabilitation.

## 2023-10-23 ENCOUNTER — Encounter: Payer: Medicare Other | Admitting: *Deleted

## 2023-10-23 DIAGNOSIS — I214 Non-ST elevation (NSTEMI) myocardial infarction: Secondary | ICD-10-CM

## 2023-10-23 DIAGNOSIS — Z48812 Encounter for surgical aftercare following surgery on the circulatory system: Secondary | ICD-10-CM | POA: Diagnosis not present

## 2023-10-23 DIAGNOSIS — I252 Old myocardial infarction: Secondary | ICD-10-CM | POA: Diagnosis not present

## 2023-10-23 NOTE — Progress Notes (Signed)
Daily Session Note  Patient Details  Name: Mike Decker MRN: [id number] Date of Birth: 11-13-1941 Referring Provider:   Flowsheet Row Cardiac Rehab from 08/21/2023 in [facility] Cardiac and Pulmonary Rehab  Referring Provider [facility]       Encounter Date: 10/23/2023  Check In:  Session Check In - 10/23/23 1107       Check-In   Supervising physician immediately available to respond to emergencies See telemetry face sheet for immediately available ER MD    Location [facility]-Cardiac & Pulmonary Rehab    Staff Present Ronette Deter, BS, Exercise Physiologist;Joseph Quinwood, Guinevere Ferrari, RN, ADN;[employer], , Exercise Physiologist    Virtual Visit No    Medication changes reported     No    Fall or balance concerns reported    No    Warm-up and Cool-down Performed on first and last piece of equipment    Resistance Training Performed Yes    VAD Patient? No    PAD/SET Patient? No      Pain Assessment   Currently in Pain? No/denies                Social History   Tobacco Use  Smoking Status Former   Current packs/day: 0.00   Types: Cigarettes   Start date: 12/10/1956   Quit date: 12/10/1976   Years since quitting: 46.8  Smokeless Tobacco Never    Goals Met:  Independence with exercise equipment Exercise tolerated well No report of concerns or symptoms today Strength training completed today  Goals Unmet:  Not Applicable  Comments: Pt able to follow exercise prescription today without complaint.  Will continue to monitor for progression.    Dr. Bethann Punches is Medical Director for [facility] Cardiac Rehabilitation.  Dr. Vida Rigger is Medical Director for [facility] Pulmonary Rehabilitation.

## 2023-10-25 ENCOUNTER — Encounter: Payer: Medicare Other | Admitting: *Deleted

## 2023-10-25 DIAGNOSIS — I214 Non-ST elevation (NSTEMI) myocardial infarction: Secondary | ICD-10-CM

## 2023-10-25 DIAGNOSIS — I252 Old myocardial infarction: Secondary | ICD-10-CM | POA: Diagnosis not present

## 2023-10-25 DIAGNOSIS — Z48812 Encounter for surgical aftercare following surgery on the circulatory system: Secondary | ICD-10-CM | POA: Diagnosis not present

## 2023-10-25 NOTE — Progress Notes (Signed)
Daily Session Note  Patient Details  Name: Mike Decker MRN: [id number] Date of Birth: 1941/03/11 Referring Provider:   Flowsheet Row Cardiac Rehab from 08/21/2023 in [facility] Cardiac and Pulmonary Rehab  Referring Provider [facility]       Encounter Date: 10/25/2023  Check In:  Session Check In - 10/25/23 1120       Check-In   Supervising physician immediately available to respond to emergencies See telemetry face sheet for immediately available ER MD    Location [facility]-Cardiac & Pulmonary Rehab    Staff Present Cora Collum, RN, BSN, CCRP;Joseph Hood, RCP,RRT,BSRT;Noah Tickle, [place], Exercise Physiologist    Virtual Visit No    Medication changes reported     No    Fall or balance concerns reported    No    Warm-up and Cool-down Performed on first and last piece of equipment    Resistance Training Performed Yes    VAD Patient? No    PAD/SET Patient? No      Pain Assessment   Currently in Pain? No/denies                Social History   Tobacco Use  Smoking Status Former   Current packs/day: 0.00   Types: Cigarettes   Start date: 12/10/1956   Quit date: 12/10/1976   Years since quitting: 46.9  Smokeless Tobacco Never    Goals Met:  Independence with exercise equipment Exercise tolerated well No report of concerns or symptoms today  Goals Unmet:  Not Applicable  Comments: Pt able to follow exercise prescription today without complaint.  Will continue to monitor for progression.    Dr. Bethann Punches is Medical Director for [facility] Cardiac Rehabilitation.  Dr. Vida Rigger is Medical Director for [facility] Pulmonary Rehabilitation.

## 2023-10-28 ENCOUNTER — Encounter: Payer: Medicare Other | Admitting: *Deleted

## 2023-10-28 DIAGNOSIS — I214 Non-ST elevation (NSTEMI) myocardial infarction: Secondary | ICD-10-CM

## 2023-10-28 NOTE — Progress Notes (Signed)
Daily Session Note  Patient Details  Name: Mike Decker MRN: [id number] Date of Birth: 05/12/1941 Referring Provider:   Flowsheet Row Cardiac Rehab from 08/21/2023 in [facility] Cardiac and Pulmonary Rehab  Referring Provider [facility]       Encounter Date: 10/28/2023  Check In:  Session Check In - 10/28/23 1053       Check-In   Supervising physician immediately available to respond to emergencies See telemetry face sheet for immediately available ER MD    Location [facility]-Cardiac & Pulmonary Rehab    Staff Present Rory Percy, MS, Exercise Physiologist;Krista Karleen Hampshire RN, Mabeline Caras, BS, ACSM CEP, Exercise Physiologist;Lawanda Holzheimer Katrinka Blazing, RN, ADN    Virtual Visit No    Medication changes reported     No    Fall or balance concerns reported    No    Warm-up and Cool-down Performed on first and last piece of equipment    Resistance Training Performed Yes    VAD Patient? No    PAD/SET Patient? No      Pain Assessment   Currently in Pain? No/denies                Social History   Tobacco Use  Smoking Status Former   Current packs/day: 0.00   Types: Cigarettes   Start date: 12/10/1956   Quit date: 12/10/1976   Years since quitting: 46.9  Smokeless Tobacco Never    Goals Met:  Independence with exercise equipment Exercise tolerated well No report of concerns or symptoms today Strength training completed today  Goals Unmet:  Not Applicable  Comments: Pt able to follow exercise prescription today without complaint.  Will continue to monitor for progression.    Dr. Bethann Punches is Medical Director for [facility] Cardiac Rehabilitation.  Dr. Vida Rigger is Medical Director for [facility] Pulmonary Rehabilitation.

## 2023-10-30 ENCOUNTER — Emergency Department: Payer: Medicare Other

## 2023-10-30 ENCOUNTER — Other Ambulatory Visit: Payer: Self-pay

## 2023-10-30 ENCOUNTER — Encounter: Payer: Self-pay | Admitting: *Deleted

## 2023-10-30 ENCOUNTER — Inpatient Hospital Stay
Admission: EM | Admit: 2023-10-30 | Discharge: 2023-11-03 | DRG: 303 | Disposition: A | Discharge status: Home / Self Care | Payer: Medicare Other | Attending: Osteopathic Medicine | Admitting: Osteopathic Medicine

## 2023-10-30 ENCOUNTER — Inpatient Hospital Stay: Payer: Medicare Other

## 2023-10-30 ENCOUNTER — Encounter: Payer: Medicare Other | Admitting: *Deleted

## 2023-10-30 DIAGNOSIS — I214 Non-ST elevation (NSTEMI) myocardial infarction: Secondary | ICD-10-CM | POA: Diagnosis present

## 2023-10-30 DIAGNOSIS — Z7901 Long term (current) use of anticoagulants: Secondary | ICD-10-CM | POA: Diagnosis not present

## 2023-10-30 DIAGNOSIS — R636 Underweight: Secondary | ICD-10-CM | POA: Diagnosis present

## 2023-10-30 DIAGNOSIS — M109 Gout, unspecified: Secondary | ICD-10-CM | POA: Diagnosis present

## 2023-10-30 DIAGNOSIS — R079 Chest pain, unspecified: Secondary | ICD-10-CM | POA: Diagnosis not present

## 2023-10-30 DIAGNOSIS — Z85820 Personal history of malignant melanoma of skin: Secondary | ICD-10-CM | POA: Diagnosis not present

## 2023-10-30 DIAGNOSIS — Z888 Allergy status to other drugs, medicaments and biological substances status: Secondary | ICD-10-CM

## 2023-10-30 DIAGNOSIS — G4733 Obstructive sleep apnea (adult) (pediatric): Secondary | ICD-10-CM | POA: Diagnosis not present

## 2023-10-30 DIAGNOSIS — E1122 Type 2 diabetes mellitus with diabetic chronic kidney disease: Secondary | ICD-10-CM | POA: Diagnosis present

## 2023-10-30 DIAGNOSIS — E669 Obesity, unspecified: Secondary | ICD-10-CM | POA: Diagnosis present

## 2023-10-30 DIAGNOSIS — I350 Nonrheumatic aortic (valve) stenosis: Secondary | ICD-10-CM | POA: Diagnosis not present

## 2023-10-30 DIAGNOSIS — I4891 Unspecified atrial fibrillation: Secondary | ICD-10-CM | POA: Diagnosis not present

## 2023-10-30 DIAGNOSIS — I25110 Atherosclerotic heart disease of native coronary artery with unstable angina pectoris: Secondary | ICD-10-CM | POA: Diagnosis not present

## 2023-10-30 DIAGNOSIS — N179 Acute kidney failure, unspecified: Principal | ICD-10-CM | POA: Diagnosis present

## 2023-10-30 DIAGNOSIS — E1151 Type 2 diabetes mellitus with diabetic peripheral angiopathy without gangrene: Secondary | ICD-10-CM | POA: Diagnosis present

## 2023-10-30 DIAGNOSIS — Z8673 Personal history of transient ischemic attack (TIA), and cerebral infarction without residual deficits: Secondary | ICD-10-CM

## 2023-10-30 DIAGNOSIS — R0789 Other chest pain: Secondary | ICD-10-CM | POA: Diagnosis not present

## 2023-10-30 DIAGNOSIS — I10 Essential (primary) hypertension: Secondary | ICD-10-CM | POA: Diagnosis not present

## 2023-10-30 DIAGNOSIS — E1165 Type 2 diabetes mellitus with hyperglycemia: Secondary | ICD-10-CM | POA: Diagnosis not present

## 2023-10-30 DIAGNOSIS — Z823 Family history of stroke: Secondary | ICD-10-CM

## 2023-10-30 DIAGNOSIS — R7989 Other specified abnormal findings of blood chemistry: Secondary | ICD-10-CM | POA: Diagnosis not present

## 2023-10-30 DIAGNOSIS — I5030 Unspecified diastolic (congestive) heart failure: Secondary | ICD-10-CM | POA: Diagnosis present

## 2023-10-30 DIAGNOSIS — Z79899 Other long term (current) drug therapy: Secondary | ICD-10-CM | POA: Diagnosis not present

## 2023-10-30 DIAGNOSIS — Z8261 Family history of arthritis: Secondary | ICD-10-CM

## 2023-10-30 DIAGNOSIS — I5032 Chronic diastolic (congestive) heart failure: Secondary | ICD-10-CM | POA: Diagnosis present

## 2023-10-30 DIAGNOSIS — I480 Paroxysmal atrial fibrillation: Secondary | ICD-10-CM | POA: Diagnosis not present

## 2023-10-30 DIAGNOSIS — J449 Chronic obstructive pulmonary disease, unspecified: Secondary | ICD-10-CM | POA: Diagnosis not present

## 2023-10-30 DIAGNOSIS — I130 Hypertensive heart and chronic kidney disease with heart failure and stage 1 through stage 4 chronic kidney disease, or unspecified chronic kidney disease: Secondary | ICD-10-CM | POA: Diagnosis present

## 2023-10-30 DIAGNOSIS — Z6831 Body mass index (BMI) 31.0-31.9, adult: Secondary | ICD-10-CM

## 2023-10-30 DIAGNOSIS — R110 Nausea: Secondary | ICD-10-CM | POA: Diagnosis not present

## 2023-10-30 DIAGNOSIS — G8929 Other chronic pain: Secondary | ICD-10-CM | POA: Diagnosis present

## 2023-10-30 DIAGNOSIS — Z7902 Long term (current) use of antithrombotics/antiplatelets: Secondary | ICD-10-CM

## 2023-10-30 DIAGNOSIS — N184 Chronic kidney disease, stage 4 (severe): Secondary | ICD-10-CM | POA: Diagnosis not present

## 2023-10-30 DIAGNOSIS — Z880 Allergy status to penicillin: Secondary | ICD-10-CM

## 2023-10-30 DIAGNOSIS — Z887 Allergy status to serum and vaccine status: Secondary | ICD-10-CM

## 2023-10-30 DIAGNOSIS — I482 Chronic atrial fibrillation, unspecified: Secondary | ICD-10-CM | POA: Insufficient documentation

## 2023-10-30 DIAGNOSIS — I2510 Atherosclerotic heart disease of native coronary artery without angina pectoris: Secondary | ICD-10-CM

## 2023-10-30 DIAGNOSIS — Z794 Long term (current) use of insulin: Secondary | ICD-10-CM

## 2023-10-30 DIAGNOSIS — Z8501 Personal history of malignant neoplasm of esophagus: Secondary | ICD-10-CM

## 2023-10-30 DIAGNOSIS — D631 Anemia in chronic kidney disease: Secondary | ICD-10-CM | POA: Diagnosis not present

## 2023-10-30 DIAGNOSIS — I5082 Biventricular heart failure: Secondary | ICD-10-CM

## 2023-10-30 DIAGNOSIS — Z860100 Personal history of colon polyps, unspecified: Secondary | ICD-10-CM

## 2023-10-30 DIAGNOSIS — E1129 Type 2 diabetes mellitus with other diabetic kidney complication: Secondary | ICD-10-CM | POA: Diagnosis present

## 2023-10-30 DIAGNOSIS — Z886 Allergy status to analgesic agent status: Secondary | ICD-10-CM

## 2023-10-30 DIAGNOSIS — Z955 Presence of coronary angioplasty implant and graft: Secondary | ICD-10-CM

## 2023-10-30 DIAGNOSIS — Z85828 Personal history of other malignant neoplasm of skin: Secondary | ICD-10-CM

## 2023-10-30 DIAGNOSIS — I252 Old myocardial infarction: Secondary | ICD-10-CM

## 2023-10-30 DIAGNOSIS — Z7984 Long term (current) use of oral hypoglycemic drugs: Secondary | ICD-10-CM

## 2023-10-30 DIAGNOSIS — Z8546 Personal history of malignant neoplasm of prostate: Secondary | ICD-10-CM

## 2023-10-30 DIAGNOSIS — E785 Hyperlipidemia, unspecified: Secondary | ICD-10-CM | POA: Diagnosis present

## 2023-10-30 DIAGNOSIS — I25119 Atherosclerotic heart disease of native coronary artery with unspecified angina pectoris: Secondary | ICD-10-CM | POA: Diagnosis not present

## 2023-10-30 DIAGNOSIS — M1990 Unspecified osteoarthritis, unspecified site: Secondary | ICD-10-CM | POA: Diagnosis present

## 2023-10-30 DIAGNOSIS — Z91030 Bee allergy status: Secondary | ICD-10-CM

## 2023-10-30 DIAGNOSIS — J439 Emphysema, unspecified: Secondary | ICD-10-CM | POA: Diagnosis not present

## 2023-10-30 DIAGNOSIS — Z87891 Personal history of nicotine dependence: Secondary | ICD-10-CM

## 2023-10-30 LAB — COMPREHENSIVE METABOLIC PANEL
ALT: 15 U/L (ref 0–44)
AST: 18 U/L (ref 15–41)
Albumin: 4.3 g/dL (ref 3.5–5.0)
Alkaline Phosphatase: 55 U/L (ref 38–126)
Anion gap: 9 (ref 5–15)
BUN: 77 mg/dL — ABNORMAL HIGH (ref 8–23)
CO2: 25 mmol/L (ref 22–32)
Calcium: 9 mg/dL (ref 8.9–10.3)
Chloride: 98 mmol/L (ref 98–111)
Creatinine, Ser: 4.03 mg/dL — ABNORMAL HIGH (ref 0.61–1.24)
GFR, Estimated: 14 mL/min — ABNORMAL LOW (ref >60)
Glucose, Bld: 199 mg/dL — ABNORMAL HIGH (ref 70–99)
Potassium: 4.4 mmol/L (ref 3.5–5.1)
Sodium: 132 mmol/L — ABNORMAL LOW (ref 135–145)
Total Bilirubin: 0.8 mg/dL (ref <1.2)
Total Protein: 7.3 g/dL (ref 6.5–8.1)

## 2023-10-30 LAB — CBC WITH DIFFERENTIAL/PLATELET
Abs Immature Granulocytes: 0.07 10*3/uL (ref 0.00–0.07)
Basophils Absolute: 0.1 10*3/uL (ref 0.0–0.1)
Basophils Relative: 1 %
Eosinophils Absolute: 0.7 10*3/uL — ABNORMAL HIGH (ref 0.0–0.5)
Eosinophils Relative: 8 %
HCT: 37.4 % — ABNORMAL LOW (ref 39.0–52.0)
Hemoglobin: 12.4 g/dL — ABNORMAL LOW (ref 13.0–17.0)
Immature Granulocytes: 1 %
Lymphocytes Relative: 14 %
Lymphs Abs: 1.2 10*3/uL (ref 0.7–4.0)
MCH: 32.2 pg (ref 26.0–34.0)
MCHC: 33.2 g/dL (ref 30.0–36.0)
MCV: 97.1 fL (ref 80.0–100.0)
Monocytes Absolute: 0.9 10*3/uL (ref 0.1–1.0)
Monocytes Relative: 10 %
Neutro Abs: 6.1 10*3/uL (ref 1.7–7.7)
Neutrophils Relative %: 66 %
Platelets: 243 10*3/uL (ref 150–400)
RBC: 3.85 MIL/uL — ABNORMAL LOW (ref 4.22–5.81)
RDW: 14.6 % (ref 11.5–15.5)
WBC: 9.1 10*3/uL (ref 4.0–10.5)
nRBC: 0 % (ref 0.0–0.2)

## 2023-10-30 LAB — TROPONIN I (HIGH SENSITIVITY)
Troponin I (High Sensitivity): 27 ng/L — ABNORMAL HIGH (ref <18)
Troponin I (High Sensitivity): 32 ng/L — ABNORMAL HIGH (ref <18)
Troponin I (High Sensitivity): 43 ng/L — ABNORMAL HIGH (ref <18)
Troponin I (High Sensitivity): 45 ng/L — ABNORMAL HIGH (ref <18)

## 2023-10-30 LAB — CBG MONITORING, ED: Glucose-Capillary: 238 mg/dL — ABNORMAL HIGH (ref 70–99)

## 2023-10-30 LAB — BRAIN NATRIURETIC PEPTIDE: B Natriuretic Peptide: 110.8 pg/mL — ABNORMAL HIGH (ref 0.0–100.0)

## 2023-10-30 LAB — MAGNESIUM: Magnesium: 2.6 mg/dL — ABNORMAL HIGH (ref 1.7–2.4)

## 2023-10-30 MED ORDER — ACETAMINOPHEN 325 MG PO TABS
650.0000 mg | ORAL_TABLET | ORAL | Status: DC | PRN
  Administered 2023-10-30: 650 mg via ORAL
  Filled 2023-10-30: qty 2

## 2023-10-30 MED ORDER — METOPROLOL SUCCINATE ER 50 MG PO TB24
50.0000 mg | ORAL_TABLET | Freq: Every day | ORAL | Status: DC
  Administered 2023-10-30: 50 mg via ORAL
  Filled 2023-10-30: qty 1

## 2023-10-30 MED ORDER — RANOLAZINE ER 500 MG PO TB12
1000.0000 mg | ORAL_TABLET | Freq: Every day | ORAL | Status: DC
  Administered 2023-10-30 – 2023-11-03 (×5): 1000 mg via ORAL
  Filled 2023-10-30 (×5): qty 2

## 2023-10-30 MED ORDER — CLOPIDOGREL BISULFATE 75 MG PO TABS
75.0000 mg | ORAL_TABLET | Freq: Every day | ORAL | Status: DC
  Administered 2023-10-30 – 2023-11-03 (×5): 75 mg via ORAL
  Filled 2023-10-30 (×5): qty 1

## 2023-10-30 MED ORDER — ONDANSETRON HCL 4 MG/2ML IJ SOLN: 4.0000 mg | Freq: Four times a day (QID) | INTRAMUSCULAR | Status: DC | PRN

## 2023-10-30 MED ORDER — HEPARIN SODIUM (PORCINE) 5000 UNIT/ML IJ SOLN
5000.0000 [IU] | Freq: Three times a day (TID) | INTRAMUSCULAR | Status: DC
  Administered 2023-10-30 – 2023-11-03 (×12): 5000 [IU] via SUBCUTANEOUS
  Filled 2023-10-30 (×12): qty 1

## 2023-10-30 MED ORDER — ISOSORBIDE MONONITRATE ER 60 MG PO TB24: 120.0000 mg | ORAL_TABLET | Freq: Every day | ORAL | Status: DC

## 2023-10-30 MED ORDER — FENTANYL CITRATE PF 50 MCG/ML IJ SOSY
25.0000 ug | PREFILLED_SYRINGE | Freq: Once | INTRAMUSCULAR | Status: AC
  Administered 2023-10-30: 25 ug via INTRAVENOUS
  Filled 2023-10-30: qty 1

## 2023-10-30 MED ORDER — INSULIN ASPART 100 UNIT/ML IJ SOLN
0.0000 [IU] | Freq: Three times a day (TID) | INTRAMUSCULAR | Status: DC
  Administered 2023-10-31: 5 [IU] via SUBCUTANEOUS
  Administered 2023-10-31: 3 [IU] via SUBCUTANEOUS
  Administered 2023-10-31: 5 [IU] via SUBCUTANEOUS
  Administered 2023-11-01: 2 [IU] via SUBCUTANEOUS
  Administered 2023-11-01: 3 [IU] via SUBCUTANEOUS
  Administered 2023-11-01: 7 [IU] via SUBCUTANEOUS
  Administered 2023-11-01: 3 [IU] via SUBCUTANEOUS
  Administered 2023-11-02: 7 [IU] via SUBCUTANEOUS
  Administered 2023-11-02: 4 [IU] via SUBCUTANEOUS
  Administered 2023-11-02 – 2023-11-03 (×3): 3 [IU] via SUBCUTANEOUS
  Administered 2023-11-03: 2 [IU] via SUBCUTANEOUS
  Filled 2023-10-30 (×13): qty 1

## 2023-10-30 MED ORDER — ATORVASTATIN CALCIUM 80 MG PO TABS
80.0000 mg | ORAL_TABLET | Freq: Every day | ORAL | Status: DC
  Administered 2023-10-30 – 2023-11-03 (×5): 80 mg via ORAL
  Filled 2023-10-30: qty 4
  Filled 2023-10-30 (×4): qty 1
  Filled 2023-10-30: qty 4

## 2023-10-30 MED ORDER — PANTOPRAZOLE SODIUM 40 MG PO TBEC
40.0000 mg | DELAYED_RELEASE_TABLET | Freq: Two times a day (BID) | ORAL | Status: DC
  Administered 2023-10-30 – 2023-11-03 (×8): 40 mg via ORAL
  Filled 2023-10-30 (×8): qty 1

## 2023-10-30 MED ORDER — MORPHINE SULFATE (PF) 4 MG/ML IV SOLN
4.0000 mg | Freq: Once | INTRAVENOUS | Status: AC
  Administered 2023-10-30: 4 mg via INTRAVENOUS
  Filled 2023-10-30: qty 1

## 2023-10-30 MED ORDER — HYDROMORPHONE HCL 1 MG/ML IJ SOLN
0.5000 mg | Freq: Once | INTRAMUSCULAR | Status: AC
  Administered 2023-10-30: 0.5 mg via INTRAVENOUS
  Filled 2023-10-30: qty 0.5

## 2023-10-30 MED ORDER — ISOSORBIDE MONONITRATE ER 60 MG PO TB24
120.0000 mg | ORAL_TABLET | Freq: Two times a day (BID) | ORAL | Status: DC
  Administered 2023-10-30 – 2023-11-03 (×8): 120 mg via ORAL
  Filled 2023-10-30 (×8): qty 2

## 2023-10-30 NOTE — Assessment & Plan Note (Signed)
Hemoglobin 12.4 near baseline Monitor Continue iron, folate, B12

## 2023-10-30 NOTE — Assessment & Plan Note (Signed)
Blood sugar in upper 190s SSI A1c

## 2023-10-30 NOTE — ED Triage Notes (Signed)
Patient comes from home by [facility] after waking with sudden pressure-like chest pain.  He says it feels like his previous two NSTEMIs.  Patient says pain radiated to left arm.  Patient took 325mg  aspirin and 3 nitroglycerin tablets.  The pain did not get better with medication.  Patient complains also of nausea, but denies shortness of breath.  EMS gave 4mg  IV zofran, and 1.5 inches of nitroglycerin paste to right chest wall.

## 2023-10-30 NOTE — Progress Notes (Signed)
       CROSS COVER NOTE  NAME: Mike Decker MRN: [id number] DOB : December 17, 1940    Concern as stated by nurse / staff   Message received from [facility] hey there--this pt is admitted for an AKI and has diabetes--he has his home medications listed in accurately but hasn't had them ordered nor any CBG checks either. thank you     Pertinent findings on chart review:  82 y.o. male with medical history significant of multiple medical issues including obesity, HFpEF, stage IV CKD, CAD, gout, hyperlipidemia, atrial fibrillation, anemia of chronic disease, COPD presented to ER for recurrent chest pain In addition to NSTEMI, labs indicative of AKI on CKD 4   Assessment and  Interventions   Assessment:  Plan: Sensitive scale SSI ordered       Donnie Mesa NP [facility] Cross Cover 7pm-7am - check amion for availability Pager [phone]

## 2023-10-30 NOTE — Assessment & Plan Note (Signed)
Remote history of CVA Continue aspirin and statin

## 2023-10-30 NOTE — Assessment & Plan Note (Signed)
Allopurinol  

## 2023-10-30 NOTE — TOC Initial Note (Signed)
Transition of Care [facility]) - Initial/Assessment Note    Patient Details  Name: Mike Decker MRN: [id number] Date of Birth: [birth year]  [facility] CM/SW Contact:    Marquita Palms, LCSW Phone Number: 10/30/2023, 11:30 AM  Clinical Narrative:                  CSW spoke with patient at bedside. Patient reported he lives with is wife and children. [facility]. 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 Patient reports he had an appointment with his PCP about a month ago.No other needs at this time.       Patient Goals and CMS Choice            Expected Discharge Plan and Services                                              Prior Living Arrangements/Services                       Activities of Daily Living   ADL Screening (condition at time of admission) Independently performs ADLs?: Yes (appropriate for developmental age) Is the patient deaf or have difficulty hearing?: No Does the patient have difficulty seeing, even when wearing glasses/contacts?: No Does the patient have difficulty concentrating, remembering, or making decisions?: No  Permission Sought/Granted                  Emotional Assessment              Admission diagnosis:  Chest pain [R07.9] Patient Active Problem List   Diagnosis Date Noted   Atrial fibrillation, chronic (HCC) 10/30/2023   Obesity (BMI 30-39.9) 04/24/2023   Moderate aortic stenosis 04/24/2023   Paroxysmal atrial fibrillation with RVR (HCC) 04/24/2023   Bronchiectasis (HCC) 04/24/2023   Hemoptysis 04/24/2023   Acute renal failure superimposed on stage 4 chronic kidney disease (HCC) 04/18/2023   Leukocytosis 04/18/2023   Dysphagia 01/31/2023   Vitamin B12 deficiency 12/26/2022   Type II diabetes mellitus with renal manifestations (HCC) 08/15/2022   Depression 08/15/2022   Biventricular congestive heart failure  (HCC) 06/28/2021   Urinary tract infection without hematuria 11/01/2020   Dysuria 11/01/2020   Hyposmolality and/or hyponatremia 09/14/2020   Cough 09/13/2020   Bronchitis 07/15/2020   Palpitations 07/15/2020   Aortic atherosclerosis (HCC) 06/21/2020   (HFpEF) heart failure with preserved ejection fraction (HCC) 06/21/2020   Otalgia of both ears 05/27/2020   Lobar pneumonia (HCC)    Type 2 diabetes mellitus with stage 4 chronic kidney disease, without long-term current use of insulin (HCC) 12/14/2019   Elevated troponin    Acidosis 10/30/2019   Benign 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 10/30/2019   Hyperkalemia 10/30/2019   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/30/2019   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   Encounter for fitting and adjustment of hearing aid 06/16/2019   Health maintenance alteration 06/16/2019   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/16/2019   DM type 2 with diabetic peripheral neuropathy (HCC) 01/25/2019   CKD (chronic kidney disease) stage 4, GFR 15-29 ml/min (HCC) 01/21/2019   Gout 01/21/2019   MR (mitral regurgitation) 01/21/2019   Chronic obstructive pulmonary disease (HCC) 01/08/2019   OSA (obstructive sleep apnea) 01/08/2019   Acute  on chronic diastolic CHF (congestive heart failure) (HCC) 12/30/2018   Polyp of sigmoid colon    Benign neoplasm of ascending colon    Hemorrhoids without complication    Diverticulosis of large intestine without diverticulitis    Reflux esophagitis    Lymphangiectasia    Arteriovenous malformation of duodenum    Symptomatic anemia    SOB (shortness of breath)    Iron deficiency anemia due to chronic blood loss    Unstable angina (HCC)    Anemia of chronic renal failure, stage 4 (severe) (HCC)    Chest pain 11/14/2018   Sciatica 11/12/2018   CAD (coronary artery disease) 02/27/2018   Sacroiliac joint pain 10/08/2017   Pain in limb 09/24/2017   Neuropathy 07/24/2017   NSTEMI (non-ST  elevated myocardial infarction) (HCC) 05/06/2017   PAD (peripheral artery disease) (HCC) 05/01/2017   History of stroke 01/02/2017   Barrett's esophagus 10/02/2016   Carotid stenosis 10/02/2016   Essential hypertension 10/02/2016   Hyperlipidemia 10/02/2016   Diabetes (HCC) 10/02/2016   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) 07/24/2016   Uncontrolled type 2 diabetes mellitus with hyperglycemia, with long-term current use of insulin (HCC) 04/10/2016   TIA (transient ischemic attack) 10/08/2015   PCP:  [name], MD Pharmacy:   [facility] [address] [place] [facility] [address] [place] [address] Phone: [phone] Fax: [phone]  [facility] - [place], [place] - [address] [place] [address] Phone: [phone] Fax: [phone]     Social Determinants of Health (SDOH) Social History: SDOH Screenings   Food Insecurity: No Food Insecurity (10/30/2023)  Housing: Low Risk  (10/30/2023)  Transportation Needs: No Transportation Needs (10/30/2023)  Utilities: Not At Risk (10/30/2023)  Alcohol Screen: Low Risk  (09/04/2018)  Depression (PHQ2-9): Medium Risk (08/21/2023)  Financial Resource Strain: Low Risk  (06/21/2023)   Received from [facility]  Physical Activity: Insufficiently Active (09/15/2021)  Social Connections: Moderately Isolated (09/15/2021)  Stress: No Stress Concern Present (09/15/2021)  Tobacco Use: Medium Risk (10/16/2023)   SDOH Interventions:     Readmission Risk Interventions    10/30/2023   11:28 AM  Readmission Risk Prevention Plan  Transportation Screening Complete  PCP or Specialist Appt within 5-7 Days Complete  Home Care Screening Complete  Medication Review (RN CM) Complete

## 2023-10-30 NOTE — Assessment & Plan Note (Signed)
Stable from a respiratory standpoint Continue home inhalers Monitor

## 2023-10-30 NOTE — ED Notes (Signed)
Pt placed on 2L of O2 for when he falls asleep. Wears CPAP at night

## 2023-10-30 NOTE — Assessment & Plan Note (Addendum)
Baseline creatinine 2.4-3 with GFR in the 20s Creatinine 4 today with GFR in the teens BUN:Cr ratio roughly 20:1  Appear euvolemic vs. Mildly dry  Gently hydrate pt  Renal imaging  Hold nephrotoxic agents  Consult nephrology

## 2023-10-30 NOTE — Progress Notes (Signed)
Cardiac Individual Treatment Plan  Patient Details  Name: Mike Decker MRN: [id number] Date of Birth: Feb 27, 1941 Referring Provider:   Flowsheet Row Cardiac Rehab from 08/21/2023 in [facility] Cardiac and Pulmonary Rehab  Referring Provider [facility]       Initial Encounter Date:  Flowsheet Row Cardiac Rehab from 08/21/2023 in [facility] Cardiac and Pulmonary Rehab  Date 08/21/23       Visit Diagnosis: NSTEMI (non-ST elevation myocardial infarction) [facility])  Patient's Home Medications on Admission: No current facility-administered medications for this visit.  Current Outpatient Medications:    acetaminophen (TYLENOL) 500 MG tablet, Take 1,000 mg by mouth every 8 (eight) hours as needed for mild pain., Disp: , Rfl:    allopurinol (ZYLOPRIM) 100 MG tablet, Take 100 mg by mouth daily., Disp: , Rfl:    atorvastatin (LIPITOR) 80 MG tablet, Take 1 tablet (80 mg total) by mouth daily., Disp: 30 tablet, Rfl: 0   azelastine (ASTELIN) 0.1 % nasal spray, USE 1 TO 2 SPRAYS IN EACH NOSTRIL TWICE A DAY, Disp: , Rfl:    calcitRIOL (ROCALTROL) 0.25 MCG capsule, Take 0.25 mcg by mouth daily., Disp: , Rfl:    cetirizine (ZYRTEC) 5 MG tablet, Take 5 mg by mouth daily., Disp: , Rfl:    citalopram (CELEXA) 10 MG tablet, Take 1 tablet (10 mg total) by mouth daily., Disp: 90 tablet, Rfl: 3   clopidogrel (PLAVIX) 75 MG tablet, TAKE 1 TABLET BY MOUTH EVERY DAY, Disp: 90 tablet, Rfl: 1   colchicine 0.6 MG tablet, Take 3 tablets (1.8 mg total) by mouth daily as needed (gout flares)., Disp: , Rfl:    [employer] VITAMIN B12 1000 MCG tablet, TAKE 1 TABLET BY MOUTH EVERY DAY, Disp: 30 tablet, Rfl: 0   diltiazem (CARDIZEM CD) 120 MG 24 hr capsule, Take 1 capsule (120 mg total) by mouth daily. (Patient not taking: Reported on 10/30/2023), Disp: 30 capsule, Rfl: 0   EPINEPHrine (EPI-PEN) 0.3 mg/0.3 mL SOAJ injection, Inject into the muscle as needed (anaphylaxis)., Disp: , Rfl:    ergocalciferol (VITAMIN D2) 1.25 MG (50000 UT)  capsule, Take 50,000 Units by mouth every 30 (thirty) days., Disp: , Rfl:    ferrous sulfate 325 (65 FE) MG EC tablet, TAKE 1 TABLET BY MOUTH EVERY DAY, Disp: 90 tablet, Rfl: 3   fluticasone (FLONASE) 50 MCG/ACT nasal spray, USE 1 SPRAY IN EACH NOSTRIL EVERY DAY, Disp: 32 g, Rfl: 6   folic acid (FOLVITE) 400 MCG tablet, Take 400 mcg by mouth daily., Disp: , Rfl:    hydrALAZINE (APRESOLINE) 50 MG tablet, Take 50 mg by mouth 2 (two) times daily., Disp: , Rfl:    insulin aspart (NOVOLOG) 100 UNIT/ML FlexPen, 10 UNITS IN AM, 30 UNITS AT LUNCH, 30 UNITS AT DINNER, Disp: , Rfl:    insulin glargine (LANTUS) 100 UNIT/ML injection, Inject 0.35 mLs (35 Units total) into the skin at bedtime. This is a decrease from your previous 45 units nightly. (Patient taking differently: Inject 54 Units into the skin at bedtime.), Disp: 10 mL, Rfl: 11   isosorbide mononitrate (IMDUR) 60 MG 24 hr tablet, Take 1 tablet (60 mg total) by mouth daily. (Patient taking differently: Take 120 mg by mouth daily.), Disp: 30 tablet, Rfl: 0   JARDIANCE 10 MG TABS tablet, Take 10 mg by mouth daily., Disp: , Rfl:    magnesium oxide (MAG-OX) 400 (240 Mg) MG tablet, Take 1 tablet by mouth daily., Disp: , Rfl:    methocarbamol (ROBAXIN) 500 MG tablet, Take  500 mg by mouth at bedtime. (Patient not taking: Reported on 10/30/2023), Disp: , Rfl: 0   metoprolol succinate (TOPROL-XL) 50 MG 24 hr tablet, Take 1 tablet by mouth daily., Disp: , Rfl:    mometasone (ASMANEX) 220 MCG/ACT inhaler, Inhale 2 puffs into the lungs daily., Disp: , Rfl:    montelukast (SINGULAIR) 10 MG tablet, TAKE 1 TABLET AT BEDTIME, Disp: 90 tablet, Rfl: 3   nitroGLYCERIN (NITROSTAT) 0.4 MG SL tablet, Place 0.4 mg under the tongue every 5 (five) minutes x 3 doses as needed for chest pain., Disp: , Rfl:    pantoprazole (PROTONIX) 40 MG tablet, Take 1 tablet (40 mg total) by mouth 2 (two) times daily before a meal., Disp: , Rfl:    ranolazine (RANEXA) 500 MG 12 hr tablet,  Take 1 tablet (500 mg total) by mouth daily. (Patient taking differently: Take 1,000 mg by mouth daily.), Disp: 30 tablet, Rfl: 0   sodium bicarbonate 650 MG tablet, Take 650 mg by mouth 2 (two) times daily., Disp: , Rfl:    sodium fluoride (DENTA 5000 PLUS) 1.1 % CREA dental cream, See admin instructions., Disp: , Rfl:    Tiotropium Bromide-Olodaterol (STIOLTO RESPIMAT) 2.5-2.5 MCG/ACT AERS, Inhale 1 each into the lungs daily., Disp: , Rfl:    Torsemide 40 MG TABS, Take 40 mg by mouth 2 (two) times daily., Disp: , Rfl:   Facility-Administered Medications Ordered in Other Visits:    acetaminophen (TYLENOL) tablet 650 mg, 650 mg, Oral, Q4H PRN, Floydene Flock, MD   heparin injection 5,000 Units, 5,000 Units, Subcutaneous, Q8H, Floydene Flock, MD   ondansetron [facility]) injection 4 mg, 4 mg, Intravenous, Q6H PRN, Floydene Flock, MD  Past Medical History: 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, esphageal   Chronic diastolic CHF (congestive heart failure) (HCC)    Colon polyp    Diabetes mellitus without complication (HCC)    Diverticulosis    Gout    Heart disease    Hemangioma    liver   Hyperlipidemia    Hypertension    Myocardial infarct (HCC)    Ocular hypertension    Peripheral vascular disease (HCC)    Skin cancer    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HCC)    Sleep apnea    Vitreoretinal degeneration     Tobacco Use: Social History   Tobacco Use  Smoking Status Former   Current packs/day: 0.00   Types: Cigarettes   Start date: 12/10/1956   Quit date: 12/10/1976   Years since quitting: 46.9  Smokeless Tobacco Never    Labs: Review Flowsheet  More data exists      Latest Ref Rng & Units 12/29/2019 07/18/2021 08/15/2022 08/16/2022 04/19/2023  Labs for ITP Cardiac and Pulmonary Rehab  Cholestrol 0 - 200 mg/dL 846  - - 962  -  LDL (calc) 0  - 99 mg/dL 51  - - 33  -  HDL-C >95 mg/dL 31  - - 17  -  Trlycerides <150 mg/dL 284  - - 132  -  Hemoglobin A1c 4.8 - 5.6 % - 8.3     7.5  - 6.2     Details       This result is from an external source.  Exercise Target Goals: Exercise Program Goal: Individual exercise prescription set using results from initial 6 min walk test and THRR while considering  patient's activity barriers and safety.   Exercise Prescription Goal: Initial exercise prescription builds to 30-45 minutes a day of aerobic activity, 2-3 days per week.  Home exercise guidelines will be given to patient during program as part of exercise prescription that the participant will acknowledge.   Education: Aerobic Exercise: - Group verbal and visual presentation on the components of exercise prescription. Introduces F.I.T.T principle from ACSM for exercise prescriptions.  Reviews F.I.T.T. principles of aerobic exercise including progression. Written material given at graduation. Flowsheet Row Cardiac Rehab from 08/21/2023 in [facility] Cardiac and Pulmonary Rehab  Education need identified 08/21/23       Education: Resistance Exercise: - Group verbal and visual presentation on the components of exercise prescription. Introduces F.I.T.T principle from ACSM for exercise prescriptions  Reviews F.I.T.T. principles of resistance exercise including progression. Written material given at graduation.    Education: Exercise & Equipment Safety: - Individual verbal instruction and demonstration of equipment use and safety with use of the equipment. Flowsheet Row Cardiac Rehab from 08/21/2023 in [facility] Cardiac and Pulmonary Rehab  Date 08/21/23  Educator MB  Instruction Review Code 1- Verbalizes Understanding       Education: Exercise Physiology & General Exercise Guidelines: - Group verbal and written instruction with models to review the exercise physiology of the cardiovascular system and associated critical values.  Provides general exercise guidelines with specific guidelines to those with heart or lung disease.    Education: Flexibility, Balance, Mind/Body Relaxation: - Group verbal and visual presentation with interactive activity on the components of exercise prescription. Introduces F.I.T.T principle from ACSM for exercise prescriptions. Reviews F.I.T.T. principles of flexibility and balance exercise training including progression. Also discusses the mind body connection.  Reviews various relaxation techniques to help reduce and manage stress (i.e. Deep breathing, progressive muscle relaxation, and visualization). Balance handout provided to take home. Written material given at graduation.   Activity Barriers & Risk Stratification:  Activity Barriers & Cardiac Risk Stratification - 08/21/23 1552       Activity Barriers & Cardiac Risk Stratification   Activity Barriers Back Problems;Other (comment);Muscular Weakness    Comments Gout flares occasionally; back problems, but tolerates    Cardiac Risk Stratification High             6 Minute Walk:  6 Minute Walk     Row Name 08/21/23 1551         6 Minute Walk   Phase Initial     Distance 890 feet     Walk Time 6 minutes     # of Rest Breaks 0     MPH 1.69     METS 1.43     RPE 13     Perceived Dyspnea  1     VO2 Peak 4.99     Symptoms Yes (comment)     Comments Fatigue in legs     Resting HR 69 bpm     Resting BP 140/70     Resting Oxygen Saturation  97 %     Exercise Oxygen Saturation  during 6 min walk 99 %     Max Ex. HR 104 bpm     Max Ex. BP 142/58     2 Minute Post BP 140/66              Oxygen Initial Assessment:   Oxygen Re-Evaluation:  Oxygen Discharge (Final Oxygen Re-Evaluation):   Initial Exercise Prescription:  Initial Exercise Prescription - 08/21/23 1500       Date of Initial Exercise RX and Referring Provider   Date 08/21/23    Referring Provider [place]      Oxygen   Maintain Oxygen  Saturation 88% or higher      Treadmill   MPH 1.4    Grade 0    Minutes 15    METs 2.07      Recumbant Bike   Level 1    RPM 50    Watts 15    Minutes 15    METs 1.43      NuStep   Level 1   T4 and T6   SPM 80    Minutes 15    METs 1.43      T5 Nustep   Level 1    SPM 80    Minutes 15    METs 1.43      Prescription Details   Frequency (times per week) 3    Duration Progress to 30 minutes of continuous aerobic without signs/symptoms of physical distress      Intensity   THRR 40-80% of Max Heartrate 96-123    Ratings of Perceived Exertion 11-13    Perceived Dyspnea 0-4      Progression   Progression Continue to progress workloads to maintain intensity without signs/symptoms of physical distress.      Resistance Training   Training Prescription Yes    Weight 4 lb    Reps 10-15             Perform Capillary Blood Glucose checks as needed.  Exercise Prescription Changes:   Exercise Prescription Changes     Row Name 08/21/23 1500 09/12/23 1600 09/25/23 1300 10/10/23 1500 10/22/23 1400     Response to Exercise   Blood Pressure (Admit) 140/70 110/68 142/84 130/58 132/52   Blood Pressure (Exercise) 142/58 180/62 164/62 142/72 158/62   Blood Pressure (Exit) 140/66 142/60 112/58 118/68 140/64   Heart Rate (Admit) 69 bpm 72 bpm 108 bpm 76 bpm 79 bpm   Heart Rate (Exercise) 104 bpm 105 bpm 108 bpm 100 bpm 111 bpm   Heart Rate (Exit) 72 bpm 84 bpm 83 bpm 74 bpm 81 bpm   Oxygen Saturation (Admit) 97 % -- -- -- --   Oxygen Saturation (Exercise) 99 % -- -- -- --   Oxygen Saturation (Exit) 97 % -- -- -- --   Rating of Perceived Exertion (Exercise) 13 13 15 15 15    Perceived Dyspnea (Exercise) 1 0 0 0 0   Symptoms fatigue in legs CP 3/4- resolved with rest none none none   Comments results -- -- -- --   Duration Progress to 30 minutes of  aerobic without signs/symptoms of physical distress Progress to 30 minutes of  aerobic without signs/symptoms of physical  distress Progress to 30 minutes of  aerobic without signs/symptoms of physical distress Progress to 30 minutes of  aerobic without signs/symptoms of physical distress Progress to 30 minutes of  aerobic without signs/symptoms of physical distress   Intensity THRR New THRR unchanged THRR unchanged THRR unchanged THRR unchanged     Progression   Progression Continue to progress workloads to maintain intensity without signs/symptoms of physical distress. Continue to progress workloads to maintain intensity without signs/symptoms of physical distress. Continue to progress workloads to maintain intensity without signs/symptoms of physical distress. Continue to progress workloads to maintain  intensity without signs/symptoms of physical distress. Continue to progress workloads to maintain intensity without signs/symptoms of physical distress.   Average METs 1.43 2.3 2.3 1.72 1.9     Resistance Training   Training Prescription -- Yes Yes Yes Yes   Weight -- 4 lb 4 lb 4 lb 4 lb   Reps -- 10-15 10-15 10-15 10-15     Interval Training   Interval Training -- No No No No     Treadmill   MPH -- 1.3 -- 1.4 --   Grade -- 0 -- 0 --   Minutes -- 15 -- 15 --   METs -- 1.99 -- 2.07 --     Recumbant Bike   Level -- 1 4 -- 3   Watts -- 25 19 -- 19   Minutes -- 15 15 -- 15   METs -- 2.91 2.68 -- 2.68     NuStep   Level -- 4 4 -- --   Minutes -- 15 15 -- --   METs -- -- 2.2 -- --     Arm Ergometer   Level -- -- -- 1.2 1.2   Minutes -- -- -- 15 15   METs -- -- -- 1 1.8     T5 Nustep   Level -- 3 4 4 4    Minutes -- 15 15 15 15    METs -- 2.1 2.1 2.1 2     Biostep-RELP   Level -- -- -- -- 3   Minutes -- -- -- -- 15   METs -- -- -- -- 2     Track   Laps -- -- -- -- 16   Minutes -- -- -- -- 15   METs -- -- -- -- 1.87     Oxygen   Maintain Oxygen Saturation -- 88% or higher 88% or higher 88% or higher 88% or higher    Row Name 10/23/23 1100             Home Exercise Plan   Plans to  continue exercise at Home (comment)  walking around his property and exercises from packet at home       Frequency Add 1 additional day to program exercise sessions.       Initial Home Exercises Provided 10/23/23                Exercise Comments:   Exercise Comments     Row Name 08/26/23 1130 10/09/23 1241         Exercise Comments First full day of exercise!  Patient was oriented to gym and equipment including functions, settings, policies, and procedures.  Patient's individual exercise prescription and treatment plan were reviewed.  All starting workloads were established based on the results of the 6 minute walk test done at initial orientation visit.  The plan for exercise progression was also introduced and progression will be customized based on patient's performance and goals. Recent hospitalization for angina.  Meds increased to pre NSTEMI level.               Exercise Goals and Review:   Exercise Goals     Row Name 08/21/23 1556             Exercise Goals   Increase Physical Activity Yes       Intervention Provide advice, education, support and counseling about physical activity/exercise needs.;Develop an individualized exercise prescription for aerobic and resistive training based on initial evaluation findings, risk stratification, comorbidities and participant's personal  goals.       Expected Outcomes Short Term: Attend rehab on a regular basis to increase amount of physical activity.;Long Term: Add in home exercise to make exercise part of routine and to increase amount of physical activity.;Long Term: Exercising regularly at least 3-5 days a week.       Increase Strength and Stamina Yes       Intervention Provide advice, education, support and counseling about physical activity/exercise needs.;Develop an individualized exercise prescription for aerobic and resistive training based on initial evaluation findings, risk stratification, comorbidities and  participant's personal goals.       Expected Outcomes Short Term: Increase workloads from initial exercise prescription for resistance, speed, and METs.;Short Term: Perform resistance training exercises routinely during rehab and add in resistance training at home;Long Term: Improve cardiorespiratory fitness, muscular endurance and strength as measured by increased METs and functional capacity ( )       Able to understand and use rate of perceived exertion (RPE) scale Yes       Intervention Provide education and explanation on how to use RPE scale       Expected Outcomes Short Term: Able to use RPE daily in rehab to express subjective intensity level;Long Term:  Able to use RPE to guide intensity level when exercising independently       Able to understand and use Dyspnea scale Yes       Intervention Provide education and explanation on how to use Dyspnea scale       Expected Outcomes Short Term: Able to use Dyspnea scale daily in rehab to express subjective sense of shortness of breath during exertion;Long Term: Able to use Dyspnea scale to guide intensity level when exercising independently       Knowledge and understanding of Target Heart Rate Range (THRR) Yes       Intervention Provide education and explanation of THRR including how the numbers were predicted and where they are located for reference       Expected Outcomes Short Term: Able to state/look up THRR;Short Term: Able to use daily as guideline for intensity in rehab;Long Term: Able to use THRR to govern intensity when exercising independently       Able to check pulse independently Yes       Intervention Provide education and demonstration on how to check pulse in carotid and radial arteries.;Review the importance of being able to check your own pulse for safety during independent exercise       Expected Outcomes Short Term: Able to explain why pulse checking is important during independent exercise;Long Term: Able to check pulse  independently and accurately       Understanding of Exercise Prescription Yes       Intervention Provide education, explanation, and written materials on patient's individual exercise prescription       Expected Outcomes Short Term: Able to explain program exercise prescription;Long Term: Able to explain home exercise prescription to exercise independently                Exercise Goals Re-Evaluation :  Exercise Goals Re-Evaluation     Row Name 08/26/23 1130 09/12/23 1648 09/25/23 1401 10/10/23 1539 10/10/23 1540     Exercise Goal Re-Evaluation   Exercise Goals Review Able to understand and use rate of perceived exertion (RPE) scale;Able to understand and use Dyspnea scale;Knowledge and understanding of Target Heart Rate Range (THRR);Understanding of Exercise Prescription Increase Physical Activity;Increase Strength and Stamina;Understanding of Exercise Prescription Increase Physical Activity;Increase Strength and  Stamina;Understanding of Exercise Prescription Increase Physical Activity;Increase Strength and Stamina;Understanding of Exercise Prescription Increase Physical Activity;Increase Strength and Stamina;Understanding of Exercise Prescription   Comments Reviewed RPE  and dyspnea scale, THR and program prescription with pt today.  Pt voiced understanding and was given a copy of goals to take home. Mike Decker is off to a great start in the program. He has attended his first 5 session during this review. He has already been able to increase his level on the T4 nustep from 1 to 4. We will continue to monitor his progress in the program. Mike Decker continues to do well in rehab. He has recently been able to signifigantly increase his level on the recumbent bike from level 1 to 4. He has also been able to maintain a consistent intensity on both the T4 and T5 nustep. We will continue to monitor his progress in the program. Mike Decker continues to do well in rehab. Mike Decker continues to do well in rehab. He was only  able to attend 3 sessions during this review. In those 3 sessions he was able to maintain a consistent workload. We will continue to monitor his progress in the program.   Expected Outcomes Short: Use RPE daily to regulate intensity. Long: Follow program prescription in THR. Short: Continue to follow current exercise prescription, and progressively increase workloads. Long: Continue exercise to improve strength and stamina. Short: Continue to follow current exercise prescription, and progressively increase workloads. Long: Continue exercise to improve strength and stamina. -- Short: Continue to follow current exercise prescription, and progressively increase workloads. Long: Continue exercise to improve strength and stamina.    Row Name 10/22/23 1500 10/23/23 1152           Exercise Goal Re-Evaluation   Exercise Goals Review Increase Physical Activity;Increase Strength and Stamina;Understanding of Exercise Prescription Increase Physical Activity;Able to understand and use rate of perceived exertion (RPE) scale;Knowledge and understanding of Target Heart Rate Range (THRR);Understanding of Exercise Prescription;Increase Strength and Stamina;Able to understand and use Dyspnea scale;Able to check pulse independently      Comments Mike Decker continues to do well in rehab. He has been able to maintain a constant workload on the recumbent bike and T5 nustep. He was also able to add the biostep at level 2, and the track at 16 laps, to his current exercise prescription. We will continue to monitor his progress in the program. Reviewed home exercise with pt today.  Pt plans to walk around property and do weight/band exercises from our packet for exercise. Pt plans to add 1 additional day of home exercise. Reviewed THR, pulse, RPE, sign and symptoms, pulse oximetery and when to call 911 or MD.  Also discussed weather considerations and indoor options.  Pt voiced understanding.      Expected Outcomes Short: Continue to  follow current exercise prescription, and progressively increase workloads. Long: Continue exercise to improve strength and stamina. Short: Add 1 additional day of home exercise involving walking and weight/band exercises. Long: Continue to exercise at home independently.               Discharge Exercise Prescription (Final Exercise Prescription Changes):  Exercise Prescription Changes - 10/23/23 1100       Home Exercise Plan   Plans to continue exercise at Home (comment)   walking around his property and exercises from packet at home   Frequency Add 1 additional day to program exercise sessions.    Initial Home Exercises Provided 10/23/23  Nutrition:  Target Goals: Understanding of nutrition guidelines, daily intake of sodium 1500mg , cholesterol 200mg , calories 30% from fat and 7% or less from saturated fats, daily to have 5 or more servings of fruits and vegetables.  Education: All About Nutrition: -Group instruction provided by verbal, written material, interactive activities, discussions, models, and posters to present general guidelines for heart healthy nutrition including fat, fiber, MyPlate, the role of sodium in heart healthy nutrition, utilization of the nutrition label, and utilization of this knowledge for meal planning. Follow up email sent as well. Written material given at graduation. Flowsheet Row Cardiac Rehab from 08/21/2023 in [facility] Cardiac and Pulmonary Rehab  Education need identified 08/21/23       Biometrics:  Pre Biometrics - 08/21/23 1557       Pre Biometrics   Height 5' 6.2" (1.681 m)    Weight 188 lb 9.6 oz (85.5 kg)    Waist Circumference 45.5 inches    Hip Circumference 46.5 inches    Waist to Hip Ratio 0.98 %    BMI (Calculated) 30.27    Single Leg Stand 4 seconds              Nutrition Therapy Plan and Nutrition Goals:  Nutrition Therapy & Goals - 08/21/23 1559       Personal Nutrition Goals   Nutrition Goal Will  meet with RD in the next 2 weeks      Intervention Plan   Intervention Prescribe, educate and counsel regarding individualized specific dietary modifications aiming towards targeted core components such as weight, hypertension, lipid management, diabetes, heart failure and other comorbidities.;Nutrition handout(s) given to patient.    Expected Outcomes Short Term Goal: Understand basic principles of dietary content, such as calories, fat, sodium, cholesterol and nutrients.;Short Term Goal: A plan has been developed with personal nutrition goals set during dietitian appointment.;Long Term Goal: Adherence to prescribed nutrition plan.             Nutrition Assessments:  MEDIFICTS Score Key: >=70 Need to make dietary changes  40-70 Heart Healthy Diet <= 40 Therapeutic Level Cholesterol Diet  Flowsheet Row Cardiac Rehab from 08/21/2023 in [facility] Cardiac and Pulmonary Rehab  Picture Your Plate Total Score on Admission 69      Picture Your Plate Scores: <40 Unhealthy dietary pattern with much room for improvement. 41-50 Dietary pattern unlikely to meet recommendations for good health and room for improvement. 51-60 More healthful dietary pattern, with some room for improvement.  >60 Healthy dietary pattern, although there may be some specific behaviors that could be improved.    Nutrition Goals Re-Evaluation:   Nutrition Goals Discharge (Final Nutrition Goals Re-Evaluation):   Psychosocial: Target Goals: Acknowledge presence or absence of significant depression and/or stress, maximize coping skills, provide positive support system. Participant is able to verbalize types and ability to use techniques and skills needed for reducing stress and depression.   Education: Stress, Anxiety, and Depression - Group verbal and visual presentation to define topics covered.  Reviews how body is impacted by stress, anxiety, and depression.  Also discusses healthy ways to reduce stress and to  treat/manage anxiety and depression.  Written material given at graduation.   Education: Sleep Hygiene -Provides group verbal and written instruction about how sleep can affect your health.  Define sleep hygiene, discuss sleep cycles and impact of sleep habits. Review good sleep hygiene tips.    Initial Review & Psychosocial Screening:  Initial Psych Review & Screening - 08/08/23 1447  Initial Review   Current issues with Current Sleep Concerns      Family Dynamics   Good Support System? Yes   wife     Screening Interventions   Interventions To provide support and resources with identified psychosocial needs    Expected Outcomes Short Term goal: Utilizing psychosocial counselor, staff and physician to assist with identification of specific Stressors or current issues interfering with healing process. Setting desired goal for each stressor or current issue identified.;Long Term Goal: Stressors or current issues are controlled or eliminated.;Short Term goal: Identification and review with participant of any Quality of Life or Depression concerns found by scoring the questionnaire.;Long Term goal: The participant improves quality of Life and PHQ9 Scores as seen by post scores and/or verbalization of changes             Quality of Life Scores:   Quality of Life - 08/21/23 1558       Quality of Life   Select Quality of Life      Quality of Life Scores   Health/Function Pre 12.53 %    Socioeconomic Pre 24.58 %    Psych/Spiritual Pre 20.14 %    Family Pre 25.2 %    GLOBAL Pre 18.26 %            Scores of 19 and below usually indicate a poorer quality of life in these areas.  A difference of  2-3 points is a clinically meaningful difference.  A difference of 2-3 points in the total score of the Quality of Life Index has been associated with significant improvement in overall quality of life, self-image, physical symptoms, and general health in studies assessing change in  quality of life.  PHQ-9: Review Flowsheet  More data exists      08/21/2023 10/29/2022 12/18/2021 09/15/2021 06/28/2021  Depression screen PHQ 2/9  Decreased Interest 0 0 0 0 0  Down, Depressed, Hopeless 0 0 0 0 0  PHQ - 2 Score 0 0 0 0 0  Altered sleeping 3 - - - -  Tired, decreased energy 2 - - - -  Change in appetite 0 - - - -  Feeling bad or failure about yourself  0 - - - -  Trouble concentrating 0 - - - -  Moving slowly or fidgety/restless 0 - - - -  Suicidal thoughts 0 - - - -  PHQ-9 Score 5 - - - -  Difficult doing work/chores Not difficult at all - - - -    Details           Interpretation of Total Score  Total Score Depression Severity:  1-4 = Minimal depression, 5-9 = Mild depression, 10-14 = Moderate depression, 15-19 = Moderately severe depression, 20-27 = Severe depression   Psychosocial Evaluation and Intervention:  Psychosocial Evaluation - 08/08/23 1513       Psychosocial Evaluation & Interventions   Interventions Relaxation education;Encouraged to exercise with the program and follow exercise prescription    Comments Jazper "Mike Decker" is coming to cardiac rehab post NSTEMI. 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 He has no barriers to attending the program. He wants to improve his strength and become physically able and comfortable walking around. He has some sleep  concerns but stress seems to be under control. He has a good support system at home with his wife. Mike Decker is looking forward to starting rehab.    Expected Outcomes Short: Attend cardiac rehab for education and exercise.  Long: [place] and maintain positive self care habits    Continue Psychosocial Services  Follow up required by staff              Psychosocial Re-Evaluation:   Psychosocial Discharge (Final Psychosocial Re-Evaluation):   Vocational Rehabilitation: Provide vocational rehab assistance to qualifying candidates.   Vocational Rehab Evaluation & Intervention:  Vocational Rehab - 08/08/23 1505       Initial Vocational Rehab Evaluation & Intervention   Assessment shows need for Vocational Rehabilitation No             Education: Education Goals: Education classes will be provided on a variety of topics geared toward better understanding of heart health and risk factor modification. Participant will state understanding/return demonstration of topics presented as noted by education test scores.  Learning Barriers/Preferences:  Learning Barriers/Preferences - 08/08/23 1452       Learning Barriers/Preferences   Learning Barriers None    Learning Preferences None             General Cardiac Education Topics:  AED/CPR: - Group verbal and written instruction with the use of models to demonstrate the basic use of the AED with the basic ABC's of resuscitation.   Anatomy and Cardiac Procedures: - Group verbal and visual presentation and models provide information about basic cardiac anatomy and function. Reviews the testing methods done to diagnose heart disease and the outcomes of the test results. Describes the treatment choices: Medical Management, Angioplasty, or Coronary Bypass Surgery for treating various heart conditions including Myocardial Infarction, Angina, Valve Disease, and Cardiac Arrhythmias.  Written material given at graduation.   Medication Safety: - Group verbal and visual instruction to review commonly prescribed medications for heart and lung disease. Reviews the medication, class of the drug, and side effects. Includes the steps to properly store meds and maintain the prescription regimen.  Written material given at graduation.   Intimacy: - Group verbal instruction through game  format to discuss how heart and lung disease can affect sexual intimacy. Written material given at graduation..   Know Your Numbers and Heart Failure: - Group verbal and visual instruction to discuss disease risk factors for cardiac and pulmonary disease and treatment options.  Reviews associated critical values for Overweight/Obesity, Hypertension, Cholesterol, and Diabetes.  Discusses basics of heart failure: signs/symptoms and treatments.  Introduces Heart Failure Zone chart for action plan for heart failure.  Written material given at graduation.   Infection Prevention: - Provides verbal and written material to individual with discussion of infection control including proper hand washing and proper equipment cleaning during exercise session. Flowsheet Row Cardiac Rehab from 08/21/2023 in [facility] Cardiac and Pulmonary Rehab  Date 08/21/23  Educator MB  Instruction Review Code 1- Verbalizes Understanding       Falls Prevention: - Provides verbal and written material to individual with discussion of falls prevention and safety. Flowsheet Row Cardiac Rehab from 08/21/2023 in [facility] Cardiac and Pulmonary Rehab  Date 08/21/23  Educator MB  Instruction Review Code 1- Verbalizes Understanding       Other: -Provides group and verbal instruction on various topics (see comments)   Knowledge Questionnaire Score:  Knowledge Questionnaire Score - 08/21/23 1603       Knowledge Questionnaire Score   Pre Score  23/26             Core Components/Risk Factors/Patient Goals at Admission:  Personal Goals and Risk Factors at Admission - 08/21/23 1603       Core Components/Risk Factors/Patient Goals on Admission    Weight Management Yes;Weight Loss    Intervention Weight Management: Develop a combined nutrition and exercise program designed to reach desired caloric intake, while maintaining appropriate intake of nutrient and fiber, sodium and fats, and appropriate energy expenditure required  for the weight goal.;Weight Management: Provide education and appropriate resources to help participant work on and attain dietary goals.;Weight Management/Obesity: Establish reasonable short term and long term weight goals.    Admit Weight 188 lb 9.6 oz (85.5 kg)    Goal Weight: Short Term 178 lb (80.7 kg)    Goal Weight: Long Term 173 lb (78.5 kg)    Expected Outcomes Short Term: Continue to assess and modify interventions until short term weight is achieved;Long Term: Adherence to nutrition and physical activity/exercise program aimed toward attainment of established weight goal;Weight Maintenance: Understanding of the daily nutrition guidelines, which includes 25-35% calories from fat, 7% or less cal from saturated fats, less than 200mg  cholesterol, less than 1.5gm of sodium, & 5 or more servings of fruits and vegetables daily;Understanding recommendations for meals to include 15-35% energy as protein, 25-35% energy from fat, 35-60% energy from carbohydrates, less than 200mg  of dietary cholesterol, 20-35 gm of total fiber daily;Weight Loss: Understanding of general recommendations for a balanced deficit meal plan, which promotes 1-2 lb weight loss per week and includes a negative energy balance of [phone] kcal/d;Understanding of distribution of calorie intake throughout the day with the consumption of 4-5 meals/snacks    Improve shortness of breath with ADL's Yes    Intervention Provide education, individualized exercise plan and daily activity instruction to help decrease symptoms of SOB with activities of daily living.    Expected Outcomes Short Term: Improve cardiorespiratory fitness to achieve a reduction of symptoms when performing ADLs;Long Term: Be able to perform more ADLs without symptoms or delay the onset of symptoms    Diabetes Yes    Intervention Provide education about signs/symptoms and action to take for hypo/hyperglycemia.;Provide education about proper nutrition, including hydration,  and aerobic/resistive exercise prescription along with prescribed medications to achieve blood glucose in normal ranges: Fasting glucose 65-99 mg/dL    Expected Outcomes Short Term: Participant verbalizes understanding of the signs/symptoms and immediate care of hyper/hypoglycemia, proper foot care and importance of medication, aerobic/resistive exercise and nutrition plan for blood glucose control.;Long Term: Attainment of HbA1C < 7%.    Heart Failure Yes    Intervention Provide a combined exercise and nutrition program that is supplemented with education, support and counseling about heart failure. Directed toward relieving symptoms such as shortness of breath, decreased exercise tolerance, and extremity edema.    Expected Outcomes Improve functional capacity of life;Short term: Attendance in program 2-3 days a week with increased exercise capacity. Reported lower sodium intake. Reported increased fruit and vegetable intake. Reports medication compliance.;Short term: Daily weights obtained and reported for increase. Utilizing diuretic protocols set by physician.;Long term: Adoption of self-care skills and reduction of barriers for early signs and symptoms recognition and intervention leading to self-care maintenance.    Hypertension Yes    Intervention Provide education on lifestyle modifcations including regular physical activity/exercise, weight management, moderate sodium restriction and increased consumption of fresh fruit, vegetables, and low fat dairy, alcohol moderation, and smoking cessation.;Monitor prescription use compliance.  Expected Outcomes Short Term: Continued assessment and intervention until BP is < 140/48mm HG in hypertensive participants. < 130/17mm HG in hypertensive participants with diabetes, heart failure or chronic kidney disease.;Long Term: Maintenance of blood pressure at goal levels.    Lipids Yes    Intervention Provide education and support for participant on nutrition &  aerobic/resistive exercise along with prescribed medications to achieve LDL 70mg , HDL >40mg .    Expected Outcomes Short Term: Participant states understanding of desired cholesterol values and is compliant with medications prescribed. Participant is following exercise prescription and nutrition guidelines.;Long Term: Cholesterol controlled with medications as prescribed, with individualized exercise RX and with personalized nutrition plan. Value goals: LDL < 70mg , HDL > 40 mg.             Education:Diabetes - Individual verbal and written instruction to review signs/symptoms of diabetes, desired ranges of glucose level fasting, after meals and with exercise. Acknowledge that pre and post exercise glucose checks will be done for 3 sessions at entry of program. Flowsheet Row Cardiac Rehab from 08/21/2023 in [facility] Cardiac and Pulmonary Rehab  Date 08/21/23  Educator MB  Instruction Review Code 1- Verbalizes Understanding       Core Components/Risk Factors/Patient Goals Review:    Core Components/Risk Factors/Patient Goals at Discharge (Final Review):    ITP Comments:  ITP Comments     Row Name 08/08/23 1510 08/21/23 1550 08/26/23 1130 09/11/23 1009 10/09/23 1240   ITP Comments Initial phone call completed. Dx can be found in [facility] 7/12. EP orientation scheduled for 9/11 at 1:30 pm. Completed and gym orientation. Initial ITP created and sent for review to Dr. Daniel Nones. First full day of exercise!  Patient was oriented to gym and equipment including functions, settings, policies, and procedures.  Patient's individual exercise prescription and treatment plan were reviewed.  All starting workloads were established based on the results of the 6 minute walk test done at initial orientation visit.  The plan for exercise progression was also introduced and progression will be customized based on patient's performance and goals. 30 Day review completed. Medical Director ITP review done, changes  made as directed, and signed approval by Medical Director.    new to program 30 Day review completed. Medical Director ITP review done, changes made as directed, and signed approval by Medical Director.    Row Name 10/09/23 1241 10/30/23 1137         ITP Comments Recent hospitalization for angina.  Meds increased to pre NSTEMI level. 30 Day review completed. Medical Director ITP review done, changes made as directed, and signed approval by Medical Director.               Comments:

## 2023-10-30 NOTE — Assessment & Plan Note (Signed)
Rate controlled at present Continue home regimen including diltiazem Not an anticoagulation candidate in the setting of history of bleeding Follow

## 2023-10-30 NOTE — ED Notes (Signed)
Pt called nurse in for CP same level as this morning. MD [facility] notified. Verbal dilaudid order given.

## 2023-10-30 NOTE — ED Notes (Signed)
Pt states CP is 6/10. Denies arm pain. States he feels a "little wheezy" as well.

## 2023-10-30 NOTE — ED Notes (Signed)
RT called about pt's CPAP. RT stated that they'll be down to assist when available.

## 2023-10-30 NOTE — ED Notes (Signed)
Pt given phone to call wife.

## 2023-10-30 NOTE — Assessment & Plan Note (Signed)
2D echo May 2024 with a EF of 55 to 60% and grade 1 diastolic dysfunction Appears fairly euvolemic at present Monitor volume status closely Follow-up cardiology recommendations

## 2023-10-30 NOTE — Consult Note (Signed)
[facility] CLINIC CARDIOLOGY CONSULT NOTE       Patient ID: Mike Decker MRN: [id number] DOB/AGE: August 27, 1941 82 y.o.  Admit date: 10/30/2023 Referring Physician Dr. Doree Albee Primary Physician [name], MD  Primary Cardiologist Dr. Juliann Pares Reason for Consultation chest pain  HPI: Mike Decker is a 82 y.o. male  with a past medical history of CAD s/p PCI w/ DES RCA, prox LCx, jailed OM 2 but good flow, HFpEF (EF 55-60% 08/2022), mild to moderate AS (mean grad 19 mmHg), CKD 4, type 2 diabetes, carotid stenosis, COPD, OSA on CPAP, Barrett's esophagus, history of prostate cancer who presented to the ED on 10/30/2023 for chest pain. Cardiology was consulted for further evaluation.   Patient reports that at 2:30 AM this morning he woke up with chest pain.  States he waited 10 minutes but the pain persisted so he took 3 nitroglycerin 5 minutes apart with no relief.  He called EMS at this time and was brought to the ED for further evaluation.  Workup in the ED notable for creatinine 4.03 (baseline 2.5-3.0), potassium 4.4, sodium 132, hemoglobin 12.4, WBC 9.1.  BNP 110.  Troponins trended 27 > 32 > 43 > 45.  EKG with sinus rhythm, nonacute and stable from prior.  CXR without acute abnormality.  At the time of my evaluation this afternoon patient is resting comfortably in ED stretcher.  Discussed his episode of pain overnight.  He states that he has chronic chest pain but this episode was worse than usual.  Reports that he still has some discomfort.  He describes the pain as left-sided pressure as well as left arm pain.  He denies any palpitations or shortness of breath.  States that he has felt like he has been wheezing.  He has been to the ED and admitted to the hospital multiple times in the last year for chest pain symptoms.  Left heart cath done in January demonstrated patent prior stents and diffuse disease not significant enough to indicate stenting.  He denies any exertional  symptoms with this episode today.  Review of systems complete and found to be negative unless listed above    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, esphageal   Chronic diastolic CHF (congestive heart failure) (HCC)    Colon polyp    Diabetes mellitus without complication (HCC)    Diverticulosis    Gout    Heart disease    Hemangioma    liver   Hyperlipidemia    Hypertension    Myocardial infarct [facility])    Ocular hypertension    Peripheral vascular disease (HCC)    Skin cancer    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HCC)    Sleep apnea    Vitreoretinal degeneration     Past Surgical History:  Procedure Laterality Date   CATARACT EXTRACTION  2011, 2012   COLONOSCOPY N/A 12/14/2018   Procedure: COLONOSCOPY;  Surgeon: Pasty Spillers, MD;  Location: [facility] ENDOSCOPY;  Service: Endoscopy;  Laterality: N/A;   CORONARY ANGIOPLASTY WITH STENT PLACEMENT  12/10/2010   CORONARY STENT INTERVENTION N/A 12/29/2019   Procedure: CORONARY STENT INTERVENTION;  Surgeon: Yvonne Kendall, MD;  Location: [facility] INVASIVE CV LAB;  Service: Cardiovascular;  Laterality: N/A;   ESOPHAGOGASTRODUODENOSCOPY N/A 12/14/2018   Procedure: ESOPHAGOGASTRODUODENOSCOPY (EGD);  Surgeon: Melodie Bouillon  B, MD;  Location: [facility] ENDOSCOPY;  Service: Endoscopy;  Laterality: N/A;   ESOPHAGOGASTRODUODENOSCOPY (EGD) WITH PROPOFOL N/A 06/01/2016   Procedure: ESOPHAGOGASTRODUODENOSCOPY (EGD) WITH PROPOFOL;  Surgeon: Scot Jun, MD;  Location: [facility] ENDOSCOPY;  Service: Endoscopy;  Laterality: N/A;   ESOPHAGOGASTRODUODENOSCOPY (EGD) WITH PROPOFOL N/A 01/31/2023   Procedure: ESOPHAGOGASTRODUODENOSCOPY (EGD) WITH PROPOFOL;  Surgeon: Midge Minium, MD;  Location: [facility] ENDOSCOPY;  Service: Endoscopy;  Laterality: N/A;   FEMORAL ARTERY STENT Right    HERNIA REPAIR     x2   INTRAOCULAR LENS  INSERTION     LEFT HEART CATH AND CORONARY ANGIOGRAPHY N/A 05/09/2017   Procedure: Left Heart Cath and Coronary Angiography;  Surgeon: Alwyn Pea, MD;  Location: [facility] INVASIVE CV LAB;  Service: Cardiovascular;  Laterality: N/A;   LEFT HEART CATH AND CORONARY ANGIOGRAPHY N/A 12/08/2018   Procedure: LEFT HEART CATH AND CORONARY ANGIOGRAPHY and possible PCI and stent;  Surgeon: Alwyn Pea, MD;  Location: [facility] INVASIVE CV LAB;  Service: Cardiovascular;  Laterality: N/A;   LEFT HEART CATH AND CORONARY ANGIOGRAPHY N/A 12/29/2019   Procedure: LEFT HEART CATH AND CORONARY ANGIOGRAPHY;  Surgeon: Yvonne Kendall, MD;  Location: [facility] INVASIVE CV LAB;  Service: Cardiovascular;  Laterality: N/A;   LEFT HEART CATH AND CORONARY ANGIOGRAPHY Left 12/13/2022   Procedure: LEFT HEART CATH AND CORONARY ANGIOGRAPHY;  Surgeon: Alwyn Pea, MD;  Location: [facility] INVASIVE CV LAB;  Service: Cardiovascular;  Laterality: Left;   NOSE SURGERY     submucous resection   PROSTATE SURGERY  12/10/2000   ROTATOR CUFF REPAIR Right     (Not in a hospital admission)  Social History   Socioeconomic History   Marital status: Married    Spouse name: Not on file   Number of children: 2   Years of education: College 3 years   Highest education level: Associate degree: occupational, Scientist, product/process development, or vocational program  Occupational History   Occupation: retired  Tobacco Use   Smoking status: Former    Current packs/day: 0.00    Types: Cigarettes    Start date: 12/10/1956    Quit date: 12/10/1976    Years since quitting: 46.9   Smokeless tobacco: Never  Vaping Use   Vaping status: Never Used  Substance and Sexual Activity   Alcohol use: No   Drug use: No   Sexual activity: Not Currently  Other Topics Concern   Not on file  Social History Narrative   Not on file   Social Determinants of Health   Financial Resource Strain: Low Risk  (06/21/2023)   Received from [facility]   Overall  Financial Resource Strain (CARDIA)    Difficulty of Paying Living Expenses: Not very hard  Food Insecurity: No Food Insecurity (10/30/2023)   Hunger Vital Sign    Worried About Running Out of Food in the Last Year: Never true    Ran Out of Food in the Last Year: Never true  Transportation Needs: No Transportation Needs (10/30/2023)   PRAPARE - Administrator, Civil Service (Medical): No    Lack of Transportation (Non-Medical): No  Physical Activity: Insufficiently Active (09/15/2021)   Exercise Vital Sign    Days of Exercise per Week: 7 days    Minutes of Exercise per Session: 20 min  Stress: No Stress Concern Present (09/15/2021)   [employer] of Occupational Health - Occupational Stress Questionnaire    Feeling of Stress : Not at all  Social Connections: Moderately Isolated (09/15/2021)  Social Advertising account executive [NHANES]    Frequency of Communication with Friends and Family: Once a week    Frequency of Social Gatherings with Friends and Family: Once a week    Attends Religious Services: More than 4 times per year    Active Member of [employer] or Organizations: No    Attends Banker Meetings: Never    Marital Status: Married  Catering manager Violence: Not At Risk (10/30/2023)   Humiliation, Afraid, Rape, and Kick questionnaire    Fear of Current or Ex-Partner: No    Emotionally Abused: No    Physically Abused: No    Sexually Abused: No    Family History  Problem Relation Age of Onset   Arthritis Mother    Stroke Maternal Grandfather    Breast cancer Neg Hx      Vitals:   10/30/23 1300 10/30/23 1400 10/30/23 1415 10/30/23 1500  BP: (!) 144/56 (!) 156/70  139/86  Pulse: 85 79 84 81  Resp: 16 12 14 18   Temp:      TempSrc:      SpO2: 97% 91% 95% 97%  Height:        PHYSICAL EXAM General: Elderly male, well nourished, in no acute distress laying at an incline in hospital bed. HEENT: Normocephalic and atraumatic. Neck: No JVD.   Lungs: Normal respiratory effort on 2 L [place]. Clear bilaterally to auscultation. No wheezes, crackles, rhonchi.  Heart: HRRR. Normal S1 and S2 without gallops or murmurs.  Abdomen: Non-distended appearing.  Msk: Normal strength and tone for age. Extremities: Warm and well perfused. No clubbing, cyanosis. No edema.  Neuro: Alert and oriented X 3. Psych: Answers questions appropriately.   Labs: Basic Metabolic Panel: Recent Labs    10/30/23 0531  NA 132*  K 4.4  CL 98  CO2 25  GLUCOSE 199*  BUN 77*  CREATININE 4.03*  CALCIUM 9.0  MG 2.6*   Liver Function Tests: Recent Labs    10/30/23 0531  AST 18  ALT 15  ALKPHOS 55  BILITOT 0.8  PROT 7.3  ALBUMIN 4.3   No results for input(s): "LIPASE", "AMYLASE" in the last 72 hours. CBC: Recent Labs    10/30/23 0426  WBC 9.1  NEUTROABS 6.1  HGB 12.4*  HCT 37.4*  MCV 97.1  PLT 243   Cardiac Enzymes: Recent Labs    10/30/23 0644 10/30/23 1006 10/30/23 1143  TROPONINIHS 32* 43* 45*   BNP: Recent Labs    10/30/23 0426  BNP 110.8*   D-Dimer: No results for input(s): "DDIMER" in the last 72 hours. Hemoglobin A1C: No results for input(s): "HGBA1C" in the last 72 hours. Fasting Lipid Panel: No results for input(s): "CHOL", "HDL", "LDLCALC", "TRIG", "CHOLHDL", "LDLDIRECT" in the last 72 hours. Thyroid Function Tests: No results for input(s): "TSH", "T4TOTAL", "T3FREE", "THYROIDAB" in the last 72 hours.  Invalid input(s): "FREET3" Anemia Panel: No results for input(s): "VITAMINB12", "FOLATE", "FERRITIN", "TIBC", "IRON", "RETICCTPCT" in the last 72 hours.   Radiology: DG Chest Portable 1 View  Result Date: 10/30/2023 CLINICAL DATA:  82 year old male with sudden onset chest pain, pressure. Similar to 2 previous NSTEMI. EXAM: PORTABLE CHEST 1 VIEW COMPARISON:  Chest radiographs 09/29/2023. FINDINGS: Portable AP upright view at 0449 hours. Stable lung volumes and mediastinal contours. Lower mediastinal lipomatosis  demonstrated on CT earlier this year. Subpleural lung scarring and some emphysema also demonstrated on that exam. But Allowing for portable technique the lungs are clear. No pneumothorax or pleural effusion.  Visualized tracheal air column is within normal limits. No acute osseous abnormality identified. IMPRESSION: No acute cardiopulmonary abnormality. Electronically Signed   By: Odessa Fleming M.D.   On: 10/30/2023 04:59    ECHO 04/2023: 1. Left ventricular ejection fraction, by estimation, is 55 to 60%. The left ventricle has normal function. The left ventricle has no regional wall motion abnormalities. Left ventricular diastolic parameters are consistent with Grade I diastolic dysfunction (impaired relaxation).   2. Right ventricular systolic function is normal. The right ventricular size is normal. There is normal pulmonary artery systolic pressure.   3. The mitral valve is normal in structure. Mild to moderate mitral valve regurgitation. No evidence of mitral stenosis.   4. The aortic valve is normal in structure. Aortic valve regurgitation is not visualized. Moderate aortic valve stenosis.   5. The inferior vena cava is normal in size with greater than 50%  respiratory variability, suggesting right atrial pressure of 3 mmHg.   TELEMETRY reviewed by me 10/30/2023: sinus rhythm rate 80s, PVCs  EKG reviewed by me: sinus rhythm rate 72 bpm, nonacute, stable from prior  Data reviewed by me 10/30/2023: last 24h vitals tele labs imaging I/O ED provider note, admission H&P  Principal Problem:   Chest pain Active Problems:   Hyperlipidemia   NSTEMI (non-ST elevated myocardial infarction) (HCC)   History of stroke   Anemia of chronic renal failure, stage 4 (severe) (HCC)   Chronic obstructive pulmonary disease (HCC)   Gout   OSA (obstructive sleep apnea)   (HFpEF) heart failure with preserved ejection fraction (HCC)   Type II diabetes mellitus with renal manifestations (HCC)   Acute renal failure  superimposed on stage 4 chronic kidney disease (HCC)   Atrial fibrillation, chronic (HCC)    ASSESSMENT AND PLAN:  Mike Decker is a 82 y.o. male  with a past medical history of CAD s/p PCI w/ DES RCA, prox LCx, jailed OM 2 but good flow, HFpEF (EF 55-60% 08/2022), mild to moderate AS (mean grad 19 mmHg), CKD 4, type 2 diabetes, carotid stenosis, COPD, OSA on CPAP, Barrett's esophagus, history of prostate cancer who presented to the ED on 10/30/2023 for chest pain. Cardiology was consulted for further evaluation.   # Chest pain # Coronary artery disease s/p multiple prior stents Patient presenting with CP which woke him up around 2:30 AM, did not improve with NTG. Troponins mildly elevated 27 > 32 > 43 > 45. EKG stable from prior, nonacute.  -Increase metoprolol to 100 mg twice daily. Increase imdur to 120 mg twice daily. Continue ranexa 1000 mg twice daily.  -Continue lipitor 80 mg daily, plavix 75 mg daily.  -No plan for further cardiac diagnostics.   # Chronic HFpEF # Moderate aortic stenosis Appears euvolemic on exam. BNP 110.  -Restart home meds as renal function allows.   # Chronic atrial fibrillation Patient with chronic AF not on anticoagulation due to bleeding issues. Rate controlled on tele.  -See plan above for metoprolol.   # AKI on CKD IV Patient with chronic kidney disease with baseline Cr 2.5-3.0, now with Cr 4.03 on presentation.  -Continue to monitor renal function closely. }   This patient's plan of care was discussed and created with Dr. Darrold Junker and he is in agreement.  Signed: Gale Journey, PA-C  10/30/2023, 3:15 PM [facility] Cardiology

## 2023-10-30 NOTE — H&P (Addendum)
History and Physical    Patient: Mike Decker ZOX:[id number] DOB: 1941-09-10 DOA: 10/30/2023 DOS: the patient was seen and examined on 10/30/2023 PCP: [name], MD  Patient coming from: Home  Chief Complaint:  Chief Complaint  Patient presents with   Chest Pain   HPI: Mike Decker is a 82 y.o. male with medical history significant of multiple medical issues including obesity, HFpEF, stage IV CKD, CAD, gout, hyperlipidemia, atrial fibrillation, anemia of chronic disease, COPD presenting with recurrent chest pain, acute on chronic kidney disease.  Patient reports recurring central chest pain since around 230 this morning.  Took roughly 3 nitroglycerin with minimal improvement in symptoms.  Still persistent nausea and diaphoresis.  Took full dose aspirin with improvement of symptoms.  No shortness of breath.  No orthopnea or PND.  Noted to be followed by Dr. Juliann Pares with [facility] cardiology.  Reports he has had his medication changes including increased dose of Ranexa.  No fevers or chills.  No focal hemiparesis or confusion. Presented to the ER afebrile, hemodynamically stable.  Satting well on room air.  White count 9, hemoglobin 12.4, platelets 243, troponin 20s to 30s.  Creatinine 4.03 with baseline creatinine around 2.53.  Glucose 190.  Chest x-ray within normal limits.  EKG normal sinus rhythm. Review of Systems: As mentioned in the history of present illness. All other systems reviewed and are negative. 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, esphageal   Chronic diastolic CHF (congestive heart failure) (HCC)    Colon polyp    Diabetes mellitus without complication (HCC)    Diverticulosis    Gout    Heart disease    Hemangioma    liver   Hyperlipidemia    Hypertension    Myocardial infarct [facility])    Ocular  hypertension    Peripheral vascular disease (HCC)    Skin cancer    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HCC)    Sleep apnea    Vitreoretinal degeneration    Past Surgical History:  Procedure Laterality Date   CATARACT EXTRACTION  2011, 2012   COLONOSCOPY N/A 12/14/2018   Procedure: COLONOSCOPY;  Surgeon: Pasty Spillers, MD;  Location: [facility] ENDOSCOPY;  Service: Endoscopy;  Laterality: N/A;   CORONARY ANGIOPLASTY WITH STENT PLACEMENT  12/10/2010   CORONARY STENT INTERVENTION N/A 12/29/2019   Procedure: CORONARY STENT INTERVENTION;  Surgeon: Yvonne Kendall, MD;  Location: [facility] INVASIVE CV LAB;  Service: Cardiovascular;  Laterality: N/A;   ESOPHAGOGASTRODUODENOSCOPY N/A 12/14/2018   Procedure: ESOPHAGOGASTRODUODENOSCOPY (EGD);  Surgeon: Pasty Spillers, MD;  Location: [facility] ENDOSCOPY;  Service: Endoscopy;  Laterality: N/A;   ESOPHAGOGASTRODUODENOSCOPY (EGD) WITH PROPOFOL N/A 06/01/2016   Procedure: ESOPHAGOGASTRODUODENOSCOPY (EGD) WITH PROPOFOL;  Surgeon: Scot Jun, MD;  Location: [facility] ENDOSCOPY;  Service: Endoscopy;  Laterality: N/A;   ESOPHAGOGASTRODUODENOSCOPY (EGD) WITH PROPOFOL N/A 01/31/2023   Procedure: ESOPHAGOGASTRODUODENOSCOPY (EGD) WITH PROPOFOL;  Surgeon: Midge Minium, MD;  Location: [facility] ENDOSCOPY;  Service: Endoscopy;  Laterality: N/A;   FEMORAL ARTERY STENT Right    HERNIA REPAIR     x2   INTRAOCULAR LENS INSERTION     LEFT HEART CATH AND CORONARY ANGIOGRAPHY N/A 05/09/2017   Procedure: Left Heart Cath and Coronary Angiography;  Surgeon: Alwyn Pea, MD;  Location: [facility] INVASIVE CV LAB;  Service: Cardiovascular;  Laterality: N/A;  LEFT HEART CATH AND CORONARY ANGIOGRAPHY N/A 12/08/2018   Procedure: LEFT HEART CATH AND CORONARY ANGIOGRAPHY and possible PCI and stent;  Surgeon: Alwyn Pea, MD;  Location: [facility] INVASIVE CV LAB;  Service: Cardiovascular;  Laterality: N/A;   LEFT HEART CATH AND CORONARY ANGIOGRAPHY N/A 12/29/2019   Procedure: LEFT HEART CATH AND  CORONARY ANGIOGRAPHY;  Surgeon: Yvonne Kendall, MD;  Location: [facility] INVASIVE CV LAB;  Service: Cardiovascular;  Laterality: N/A;   LEFT HEART CATH AND CORONARY ANGIOGRAPHY Left 12/13/2022   Procedure: LEFT HEART CATH AND CORONARY ANGIOGRAPHY;  Surgeon: Alwyn Pea, MD;  Location: [facility] INVASIVE CV LAB;  Service: Cardiovascular;  Laterality: Left;   NOSE SURGERY     submucous resection   PROSTATE SURGERY  12/10/2000   ROTATOR CUFF REPAIR Right    Social History:  reports that he quit smoking about 46 years ago. His smoking use included cigarettes. He started smoking about 66 years ago. He has never used smokeless tobacco. He reports that he does not drink alcohol and does not use drugs.  Allergies  Allergen Reactions   Gabapentin     Other reaction(s): Other (See Comments) Tremors   Peanut-Containing Drug Products Anaphylaxis   Penicillins Hives and Rash    Has patient had a PCN reaction causing immediate rash, facial/tongue/throat swelling, SOB or lightheadedness with hypotension: Yes Has patient had a PCN reaction causing severe rash involving mucus membranes or skin necrosis: No Has patient had a PCN reaction that required hospitalization No Has patient had a PCN reaction occurring within the last 10 years: No If all of the above answers are "NO", then may proceed with Cephalosporin use.   Bee Venom Swelling   Influenza Vaccines Hives   Inh [Isoniazid] Hives   Jardiance [Empagliflozin] Other (See Comments)    Diarrhea   Kenalog [Triamcinolone Acetonide] Hives   Levaquin [Levofloxacin] Other (See Comments)    Tendon, ligament pain.    Lisinopril     Other reaction(s): Hyperkalemia   Nalfon [Fenoprofen Calcium] Hives   Naproxen    Peanut Oil    Nsaids Rash    Nalfon 600 Nalfon 600    Family History  Problem Relation Age of Onset   Arthritis Mother    Stroke Maternal Grandfather    Breast cancer Neg Hx     Prior to Admission medications   Medication Sig Start Date  End Date Taking? Authorizing Provider  acetaminophen (TYLENOL) 500 MG tablet Take 1,000 mg by mouth every 8 (eight) hours as needed for mild pain.   Yes [provider]  allopurinol (ZYLOPRIM) 100 MG tablet Take 100 mg by mouth daily.   Yes [provider]  atorvastatin (LIPITOR) 80 MG tablet Take 1 tablet (80 mg total) by mouth daily. 04/25/23  Yes Marrion Coy, MD  azelastine (ASTELIN) 0.1 % nasal spray USE 1 TO 2 SPRAYS IN EACH NOSTRIL TWICE A DAY 06/01/20  Yes [provider]  cetirizine (ZYRTEC) 5 MG tablet Take 5 mg by mouth daily.   Yes [provider]  citalopram (CELEXA) 10 MG tablet Take 1 tablet (10 mg total) by mouth daily. 03/12/22  Yes Masoud, Renda Rolls, MD  clopidogrel (PLAVIX) 75 MG tablet TAKE 1 TABLET BY MOUTH EVERY DAY 07/02/22  Yes Masoud, Renda Rolls, MD  colchicine 0.6 MG tablet Take 3 tablets (1.8 mg total) by mouth daily as needed (gout flares). 12/30/19  Yes Darlin Priestly, MD  [employer] VITAMIN B12 1000 MCG tablet TAKE 1 TABLET BY MOUTH EVERY DAY  11/21/21  Yes Rickard Patience, MD  EPINEPHrine (EPI-PEN) 0.3 mg/0.3 mL SOAJ injection Inject into the muscle as needed (anaphylaxis).   Yes [provider]  ergocalciferol (VITAMIN D2) 1.25 MG (50000 UT) capsule Take 50,000 Units by mouth every 30 (thirty) days.   Yes [provider]  ferrous sulfate 325 (65 FE) MG EC tablet TAKE 1 TABLET BY MOUTH EVERY DAY 02/06/21  Yes Rickard Patience, MD  fluticasone [facility]) 50 MCG/ACT nasal spray USE 1 SPRAY IN EACH NOSTRIL EVERY DAY 09/12/20  Yes Masoud, Renda Rolls, MD  folic acid (FOLVITE) 400 MCG tablet Take 400 mcg by mouth daily. 10/14/20  Yes [provider]  hydrALAZINE (APRESOLINE) 50 MG tablet Take 50 mg by mouth 2 (two) times daily. 08/13/22 06/24/24 Yes [provider]  insulin aspart (NOVOLOG) 100 UNIT/ML FlexPen 10 UNITS IN AM, 30 UNITS AT LUNCH, 30 UNITS AT [facility] 04/25/20  Yes [provider]  insulin glargine (LANTUS) 100 UNIT/ML injection Inject  0.35 mLs (35 Units total) into the skin at bedtime. This is a decrease from your previous 45 units nightly. Patient taking differently: Inject 54 Units into the skin at bedtime. 12/30/19  Yes Darlin Priestly, MD  isosorbide mononitrate (IMDUR) 60 MG 24 hr tablet Take 1 tablet (60 mg total) by mouth daily. Patient taking differently: Take 120 mg by mouth daily. 04/25/23  Yes Marrion Coy, MD  JARDIANCE 10 MG TABS tablet Take 10 mg by mouth daily. 02/19/22  Yes [provider]  magnesium oxide (MAG-OX) 400 (240 Mg) MG tablet Take 1 tablet by mouth daily. 04/25/22  Yes [provider]  metoprolol succinate (TOPROL-XL) 50 MG 24 hr tablet Take 1 tablet by mouth daily. 06/26/23 06/25/24 Yes [provider]  mometasone (ASMANEX) 220 MCG/ACT inhaler Inhale 2 puffs into the lungs daily. 05/30/22  Yes [provider]  montelukast (SINGULAIR) 10 MG tablet TAKE 1 TABLET AT BEDTIME 08/28/23  Yes Yevonne Pax, MD  nitroGLYCERIN (NITROSTAT) 0.4 MG SL tablet Place 0.4 mg under the tongue every 5 (five) minutes x 3 doses as needed for chest pain.   Yes [provider]  pantoprazole (PROTONIX) 40 MG tablet Take 1 tablet (40 mg total) by mouth 2 (two) times daily before a meal. 12/17/19  Yes Amin, Ankit C, MD  ranolazine (RANEXA) 500 MG 12 hr tablet Take 1 tablet (500 mg total) by mouth daily. Patient taking differently: Take 1,000 mg by mouth daily. 04/25/23  Yes Marrion Coy, MD  sodium bicarbonate 650 MG tablet Take 650 mg by mouth 2 (two) times daily.   Yes [provider]  Tiotropium Bromide-Olodaterol (STIOLTO RESPIMAT) 2.5-2.5 MCG/ACT AERS Inhale 1 each into the lungs daily.   Yes [provider]  Torsemide 40 MG TABS Take 40 mg by mouth 2 (two) times daily. 07/05/23  Yes [provider]  calcitRIOL (ROCALTROL) 0.25 MCG capsule Take 0.25 mcg by mouth daily.    [provider]  diltiazem (CARDIZEM CD) 120 MG 24 hr capsule Take 1 capsule (120 mg  total) by mouth daily. Patient not taking: Reported on 10/30/2023 04/25/23   Marrion Coy, MD  methocarbamol (ROBAXIN) 500 MG tablet Take 500 mg by mouth at bedtime. Patient not taking: Reported on 10/30/2023 11/08/18   [provider]  sodium fluoride (DENTA 5000 PLUS) 1.1 % CREA dental cream See admin instructions. 01/03/22   [provider]    Physical Exam: Vitals:   10/30/23 0600 10/30/23 0630 10/30/23 0700 10/30/23 0800  BP: Marland Kitchen)  159/58 (!) 157/76 (!) 163/69 (!) 148/81  Pulse: 67 68 70 71  Resp: 13 10 10 13   Temp:      TempSrc:      SpO2: 98% 97% 97% 96%   Physical Exam Constitutional:      Appearance: He is obese.  HENT:     Head: Normocephalic and atraumatic.     Nose: Nose normal.     Mouth/Throat:     Mouth: Mucous membranes are moist.  Eyes:     Pupils: Pupils are equal, round, and reactive to light.  Cardiovascular:     Rate and Rhythm: Normal rate and regular rhythm.  Pulmonary:     Effort: Pulmonary effort is normal.  Abdominal:     General: Bowel sounds are normal.  Musculoskeletal:        General: Normal range of motion.  Skin:    General: Skin is warm.  Neurological:     General: No focal deficit present.  Psychiatric:        Mood and Affect: Mood normal.     Data Reviewed:  There are no new results to review at this time.  DG Chest Portable 1 View CLINICAL DATA:  82 year old male with sudden onset chest pain, pressure. Similar to 2 previous NSTEMI.  EXAM: PORTABLE CHEST 1 VIEW  COMPARISON:  Chest radiographs 09/29/2023.  FINDINGS: Portable AP upright view at 0449 hours. Stable lung volumes and mediastinal contours. Lower mediastinal lipomatosis demonstrated on CT earlier this year. Subpleural lung scarring and some emphysema also demonstrated on that exam. But Allowing for portable technique the lungs are clear. No pneumothorax or pleural effusion. Visualized tracheal air column is within normal limits. No acute  osseous abnormality identified.  IMPRESSION: No acute cardiopulmonary abnormality.  Electronically Signed   By: Odessa Fleming M.D.   On: 10/30/2023 04:59  Lab Results  Component Value Date   WBC 9.1 10/30/2023   HGB 12.4 (L) 10/30/2023   HCT 37.4 (L) 10/30/2023   MCV 97.1 10/30/2023   PLT 243 10/30/2023   Last metabolic panel Lab Results  Component Value Date   GLUCOSE 199 (H) 10/30/2023   NA 132 (L) 10/30/2023   K 4.4 10/30/2023   CL 98 10/30/2023   CO2 25 10/30/2023   BUN 77 (H) 10/30/2023   CREATININE 4.03 (H) 10/30/2023   GFRNONAA 14 (L) 10/30/2023   CALCIUM 9.0 10/30/2023   PHOS 4.1 05/13/2017   PROT 7.3 10/30/2023   ALBUMIN 4.3 10/30/2023   LABGLOB 2.7 12/30/2018   BILITOT 0.8 10/30/2023   ALKPHOS 55 10/30/2023   AST 18 10/30/2023   ALT 15 10/30/2023   ANIONGAP 9 10/30/2023    Assessment and Plan: NSTEMI (non-ST elevated myocardial infarction) (HCC) Minimal NSTEMI with troponin 20s 30s though with recurring central chest pain nausea and diaphoresis Baseline NSTEMI 05/2017, s/p cardiac cath showing 2V disease. NSTEMI 12/2019, s/p cath with PCI and stent placement. NSTEM 12/2022, s/p cath with mutivessel diz, no intervention  EKG normal sinus rhythm Will continue to trend troponin Status post aspirin in the ER Nitro patch in place Cardiology consultation Monitor  Acute renal failure superimposed on stage 4 chronic kidney disease (HCC) Baseline creatinine 2.4-3 with GFR in the 20s Creatinine 4 today with GFR in the teens BUN:Cr ratio roughly 20:1  Appear euvolemic vs. Mildly dry  Gently hydrate pt  Renal imaging  Consult nephrology    History of stroke Remote history of CVA Continue aspirin and statin  Type II diabetes mellitus  with renal manifestations (HCC) Blood sugar in upper 190s SSI A1c  Hyperlipidemia Continue statin  Anemia of chronic renal failure, stage 4 (severe) (HCC) Hemoglobin 12.4 near baseline Monitor Continue iron, folate,  B12  Gout Allopurinol  Atrial fibrillation, chronic (HCC) Rate controlled at present Continue home regimen including diltiazem Not an anticoagulation candidate in the setting of history of bleeding Follow  (HFpEF) heart failure with preserved ejection fraction (HCC) 2D echo May 2024 with a EF of 55 to 60% and grade 1 diastolic dysfunction Appears fairly euvolemic at present Monitor volume status closely Follow-up cardiology recommendations  OSA (obstructive sleep apnea) CPAP  Chronic obstructive pulmonary disease (HCC) Stable from a respiratory standpoint Continue home inhalers Monitor   Greater than 50% was spent in counseling and coordination of care with patient Total encounter time 80 minutes or more    Advance Care Planning:   Code Status: Full Code   Consults: [facility] cardiology, nephrology   Family Communication: No family at the bedside   Severity of Illness: The appropriate patient status for this patient is INPATIENT. Inpatient status is judged to be reasonable and necessary in order to provide the required intensity of service to ensure the patient's safety. The patient's presenting symptoms, physical exam findings, and initial radiographic and laboratory data in the context of their chronic comorbidities is felt to place them at high risk for further clinical deterioration. Furthermore, it is not anticipated that the patient will be medically stable for discharge from the hospital within 2 midnights of admission.   * I certify that at the point of admission it is my clinical judgment that the patient will require inpatient hospital care spanning beyond 2 midnights from the point of admission due to high intensity of service, high risk for further deterioration and high frequency of surveillance required.*  Author: Floydene Flock, MD 10/30/2023 10:08 AM  For on call review [email or web address].

## 2023-10-30 NOTE — Assessment & Plan Note (Signed)
Continue statin. 

## 2023-10-30 NOTE — Assessment & Plan Note (Signed)
CPAP.  

## 2023-10-30 NOTE — Assessment & Plan Note (Signed)
Minimal NSTEMI with troponin 20s 30s though with recurring central chest pain nausea and diaphoresis Baseline NSTEMI 05/2017, s/p cardiac cath showing 2V disease. NSTEMI 12/2019, s/p cath with PCI and stent placement. NSTEM 12/2022, s/p cath with mutivessel diz, no intervention  EKG normal sinus rhythm Will continue to trend troponin Status post aspirin in the ER Nitro patch in place Cardiology consultation Monitor

## 2023-10-30 NOTE — ED Notes (Signed)
Pt inquired about his insulin as he normally takes it at home.  Manuela Schwartz, NP messaged regarding the above. Pt's medication list is accurate in chart when verified with pt.

## 2023-10-30 NOTE — ED Provider Notes (Signed)
[facility] Provider Note    Event Date/Time   First MD Initiated Contact with Patient 10/30/23 0423     (approximate)   History   Chest Pain   HPI  Mike Decker is a 82 y.o. male who presents to the ED for evaluation of Chest Pain   I review a cardiology clinic visit from last month.  History of multivessel disease, previous non-STEMI is, paroxysmal A-fib, CHF.  CKD 4.  Torsemide, Ranexa, Imdur, Plavix.  Not on anticoagulation due to bleeding history.  Patient presents for evaluation of acute chest discomfort that started less than an hour prior to arrival.  Reports feeling fine prior to that.  Symptoms are concerning and similar as when he has had an NSTEMI in the past   Physical Exam   Triage Vital Signs: ED Triage Vitals  Encounter Vitals Group     BP 10/30/23 0419 (!) 160/60     Systolic BP Percentile --      Diastolic BP Percentile --      Pulse Rate 10/30/23 0419 72     Resp 10/30/23 0419 14     Temp 10/30/23 0419 98 F (36.7 C)     Temp Source 10/30/23 0419 Oral     SpO2 10/30/23 0419 97 %     Weight --      Height --      Head Circumference --      Peak Flow --      Pain Score 10/30/23 0422 7     Pain Loc --      Pain Education --      Exclude from Growth Chart --     Most recent vital signs: Vitals:   10/30/23 0419 10/30/23 0600  BP: (!) 160/60 (!) 159/58  Pulse: 72 67  Resp: 14 13  Temp: 98 F (36.7 C)   SpO2: 97% 98%    General: Awake, no distress.  CV:  Good peripheral perfusion.  Blowing aortic murmur is present Resp:  Normal effort.  Abd:  No distention.  MSK:  No deformity noted.  Neuro:  No focal deficits appreciated. Other:     ED Results / Procedures / Treatments   Labs (all labs ordered are listed, but only abnormal results are displayed) Labs Reviewed  CBC WITH DIFFERENTIAL/PLATELET - Abnormal; Notable for the following components:      Result Value   RBC 3.85 (*)    Hemoglobin 12.4 (*)     HCT 37.4 (*)    Eosinophils Absolute 0.7 (*)    All other components within normal limits  BRAIN NATRIURETIC PEPTIDE - Abnormal; Notable for the following components:   B Natriuretic Peptide 110.8 (*)    All other components within normal limits  COMPREHENSIVE METABOLIC PANEL - Abnormal; Notable for the following components:   Sodium 132 (*)    Glucose, Bld 199 (*)    BUN 77 (*)    Creatinine, Ser 4.03 (*)    GFR, Estimated 14 (*)    All other components within normal limits  MAGNESIUM - Abnormal; Notable for the following components:   Magnesium 2.6 (*)    All other components within normal limits  TROPONIN I (HIGH SENSITIVITY) - Abnormal; Notable for the following components:   Troponin I (High Sensitivity) 27 (*)    All other components within normal limits  TROPONIN I (HIGH SENSITIVITY)    EKG Sinus rhythm with a rate of 72 bpm.  Normal axis and  intervals.  No clear signs of acute ischemia.  RADIOLOGY CXR interpreted by me without evidence of acute cardiopulmonary pathology.  Official radiology report(s): DG Chest Portable 1 View  Result Date: 10/30/2023 CLINICAL DATA:  82 year old male with sudden onset chest pain, pressure. Similar to 2 previous NSTEMI. EXAM: PORTABLE CHEST 1 VIEW COMPARISON:  Chest radiographs 09/29/2023. FINDINGS: Portable AP upright view at 0449 hours. Stable lung volumes and mediastinal contours. Lower mediastinal lipomatosis demonstrated on CT earlier this year. Subpleural lung scarring and some emphysema also demonstrated on that exam. But Allowing for portable technique the lungs are clear. No pneumothorax or pleural effusion. Visualized tracheal air column is within normal limits. No acute osseous abnormality identified. IMPRESSION: No acute cardiopulmonary abnormality. Electronically Signed   By: Odessa Fleming M.D.   On: 10/30/2023 04:59    PROCEDURES and INTERVENTIONS:  .1-3 Lead EKG Interpretation  Performed by: Delton Prairie, MD Authorized by:  Delton Prairie, MD     Interpretation: normal     ECG rate:  70   ECG rate assessment: normal     Rhythm: sinus rhythm     Ectopy: none     Conduction: normal     Medications  morphine (PF) 4 MG/ML injection 4 mg (4 mg Intravenous Given 10/30/23 0508)  fentaNYL (SUBLIMAZE) injection 25 mcg (25 mcg Intravenous Given 10/30/23 0645)     IMPRESSION / MDM / ASSESSMENT AND PLAN / ED COURSE  I reviewed the triage vital signs and the nursing notes.  Differential diagnosis includes, but is not limited to, ACS, PTX, PNA, muscle strain/spasm, PE, dissection, anxiety, pleural effusion  {Patient presents with symptoms of an acute illness or injury that is potentially life-threatening.  Patient with extensive cardiac history presents with chest discomfort.  Reassuring EKG and first troponin is mildly chronically elevated, awaiting second troponin.  Metabolic panel with an AKI on CKD that will require admission.  Essentially normal CBC.  BNP is minimally elevated, CXR is clear and clinically no evidence of a CHF exacerbation.  Awaiting second troponin at the time of oncoming morning physician to elucidate if he needs heparinization prior to admission for his AKI.  Clinical Course as of 10/30/23 2841  Wed Oct 30, 2023  3244 Reviewed most recent left heart cath from 10 months ago.  Multivessel moderate disease, normal EF.  No intervention [DS]  0628 Reassessed and discussed plan of care [DS]  0632 I call his wife, Mike Decker, and update her at his request [DS]    Clinical Course User Index [DS] Delton Prairie, MD     FINAL CLINICAL IMPRESSION(S) / ED DIAGNOSES   Final diagnoses:  AKI (acute kidney injury) (HCC)  Other chest pain     Rx / DC Orders   ED Discharge Orders     None        Note:  This document was prepared using Dragon voice recognition software and may include unintentional dictation errors.    Delton Prairie, MD 10/30/23 [phone]

## 2023-10-31 ENCOUNTER — Encounter: Payer: Self-pay | Admitting: Family Medicine

## 2023-10-31 DIAGNOSIS — R079 Chest pain, unspecified: Secondary | ICD-10-CM | POA: Diagnosis not present

## 2023-10-31 LAB — BASIC METABOLIC PANEL
Anion gap: 11 (ref 5–15)
BUN: 71 mg/dL — ABNORMAL HIGH (ref 8–23)
CO2: 24 mmol/L (ref 22–32)
Calcium: 8.9 mg/dL (ref 8.9–10.3)
Chloride: 97 mmol/L — ABNORMAL LOW (ref 98–111)
Creatinine, Ser: 3.57 mg/dL — ABNORMAL HIGH (ref 0.61–1.24)
GFR, Estimated: 16 mL/min — ABNORMAL LOW (ref >60)
Glucose, Bld: 233 mg/dL — ABNORMAL HIGH (ref 70–99)
Potassium: 4.8 mmol/L (ref 3.5–5.1)
Sodium: 132 mmol/L — ABNORMAL LOW (ref 135–145)

## 2023-10-31 LAB — CBC
HCT: 35.3 % — ABNORMAL LOW (ref 39.0–52.0)
Hemoglobin: 11.7 g/dL — ABNORMAL LOW (ref 13.0–17.0)
MCH: 32.3 pg (ref 26.0–34.0)
MCHC: 33.1 g/dL (ref 30.0–36.0)
MCV: 97.5 fL (ref 80.0–100.0)
Platelets: 236 10*3/uL (ref 150–400)
RBC: 3.62 MIL/uL — ABNORMAL LOW (ref 4.22–5.81)
RDW: 14.6 % (ref 11.5–15.5)
WBC: 9.4 10*3/uL (ref 4.0–10.5)
nRBC: 0 % (ref 0.0–0.2)

## 2023-10-31 LAB — CBG MONITORING, ED
Glucose-Capillary: 228 mg/dL — ABNORMAL HIGH (ref 70–99)
Glucose-Capillary: 294 mg/dL — ABNORMAL HIGH (ref 70–99)

## 2023-10-31 LAB — HEMOGLOBIN A1C
Hgb A1c MFr Bld: 7.1 % — ABNORMAL HIGH (ref 4.8–5.6)
Mean Plasma Glucose: 157.07 mg/dL

## 2023-10-31 MED ORDER — INSULIN GLARGINE-YFGN 100 UNIT/ML ~~LOC~~ SOLN
50.0000 [IU] | Freq: Every day | SUBCUTANEOUS | Status: DC
  Administered 2023-10-31 – 2023-11-02 (×3): 50 [IU] via SUBCUTANEOUS
  Filled 2023-10-31 (×4): qty 0.5

## 2023-10-31 MED ORDER — MONTELUKAST SODIUM 10 MG PO TABS
10.0000 mg | ORAL_TABLET | Freq: Every day | ORAL | Status: DC
  Administered 2023-10-31 – 2023-11-02 (×3): 10 mg via ORAL
  Filled 2023-10-31 (×3): qty 1

## 2023-10-31 MED ORDER — TORSEMIDE 20 MG PO TABS
20.0000 mg | ORAL_TABLET | Freq: Once | ORAL | Status: AC
  Administered 2023-10-31: 20 mg via ORAL
  Filled 2023-10-31: qty 1

## 2023-10-31 MED ORDER — ARFORMOTEROL TARTRATE 15 MCG/2ML IN NEBU
15.0000 ug | INHALATION_SOLUTION | Freq: Two times a day (BID) | RESPIRATORY_TRACT | Status: DC
  Administered 2023-10-31 – 2023-11-03 (×6): 15 ug via RESPIRATORY_TRACT
  Filled 2023-10-31 (×8): qty 2

## 2023-10-31 MED ORDER — METOPROLOL SUCCINATE ER 100 MG PO TB24
100.0000 mg | ORAL_TABLET | Freq: Every day | ORAL | Status: DC
  Administered 2023-10-31 – 2023-11-01 (×2): 100 mg via ORAL
  Filled 2023-10-31: qty 1
  Filled 2023-10-31: qty 2

## 2023-10-31 MED ORDER — SODIUM BICARBONATE 650 MG PO TABS
650.0000 mg | ORAL_TABLET | Freq: Two times a day (BID) | ORAL | Status: DC
  Administered 2023-10-31 – 2023-11-03 (×7): 650 mg via ORAL
  Filled 2023-10-31 (×7): qty 1

## 2023-10-31 MED ORDER — CALCITRIOL 0.25 MCG PO CAPS
0.2500 ug | ORAL_CAPSULE | Freq: Every day | ORAL | Status: DC
  Administered 2023-10-31 – 2023-11-03 (×4): 0.25 ug via ORAL
  Filled 2023-10-31 (×4): qty 1

## 2023-10-31 MED ORDER — UMECLIDINIUM BROMIDE 62.5 MCG/ACT IN AEPB
1.0000 | INHALATION_SPRAY | Freq: Every day | RESPIRATORY_TRACT | Status: DC
  Administered 2023-10-31 – 2023-11-03 (×4): 1 via RESPIRATORY_TRACT
  Filled 2023-10-31: qty 7

## 2023-10-31 MED ORDER — CITALOPRAM HYDROBROMIDE 20 MG PO TABS
10.0000 mg | ORAL_TABLET | Freq: Every day | ORAL | Status: DC
  Administered 2023-10-31 – 2023-11-03 (×4): 10 mg via ORAL
  Filled 2023-10-31 (×5): qty 1

## 2023-10-31 MED ORDER — MOMETASONE FUROATE 220 MCG/ACT IN AEPB: 2.0000 | INHALATION_SPRAY | Freq: Every day | RESPIRATORY_TRACT | Status: DC

## 2023-10-31 NOTE — Progress Notes (Signed)
[facility] CLINIC CARDIOLOGY PROGRESS NOTE       Patient ID: Mike Decker MRN: [id number] DOB/AGE: 1941-11-12 82 y.o.  Admit date: 10/30/2023 Referring Physician Dr. Doree Albee Primary Physician [name], MD  Primary Cardiologist Dr. Juliann Pares Reason for Consultation chest pain  HPI: Mike Decker is a 82 y.o. male  with a past medical history of CAD s/p PCI w/ DES RCA, prox LCx, jailed OM 2 but good flow, HFpEF (EF 55-60% 08/2022), mild to moderate AS (mean grad 19 mmHg), CKD 4, type 2 diabetes, carotid stenosis, COPD, OSA on CPAP, Barrett's esophagus, history of prostate cancer who presented to the ED on 10/30/2023 for chest pain. Cardiology was consulted for further evaluation.   Interval history: -Patient seen and examined this AM, reports he is feeling much better today. States CP is significantly improved. Denies any SOB.  -BP and HR controlled. Tolerating increased dose of imdur well.  -Renal function improved on AM labs, still elevated above baseline.   Review of systems complete and found to be negative unless listed above    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, esphageal   Chronic diastolic CHF (congestive heart failure) (HCC)    Colon polyp    Diabetes mellitus without complication (HCC)    Diverticulosis    Gout    Heart disease    Hemangioma    liver   Hyperlipidemia    Hypertension    Myocardial infarct [facility])    Ocular hypertension    Peripheral vascular disease (HCC)    Skin cancer    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HCC)    Sleep apnea    Vitreoretinal degeneration     Past Surgical History:  Procedure Laterality Date   CATARACT EXTRACTION  2011, 2012   COLONOSCOPY N/A 12/14/2018   Procedure: COLONOSCOPY;  Surgeon: Pasty Spillers, MD;  Location: [facility] ENDOSCOPY;  Service: Endoscopy;   Laterality: N/A;   CORONARY ANGIOPLASTY WITH STENT PLACEMENT  12/10/2010   CORONARY STENT INTERVENTION N/A 12/29/2019   Procedure: CORONARY STENT INTERVENTION;  Surgeon: Yvonne Kendall, MD;  Location: [facility] INVASIVE CV LAB;  Service: Cardiovascular;  Laterality: N/A;   ESOPHAGOGASTRODUODENOSCOPY N/A 12/14/2018   Procedure: ESOPHAGOGASTRODUODENOSCOPY (EGD);  Surgeon: Pasty Spillers, MD;  Location: [facility] ENDOSCOPY;  Service: Endoscopy;  Laterality: N/A;   ESOPHAGOGASTRODUODENOSCOPY (EGD) WITH PROPOFOL N/A 06/01/2016   Procedure: ESOPHAGOGASTRODUODENOSCOPY (EGD) WITH PROPOFOL;  Surgeon: Scot Jun, MD;  Location: [facility] ENDOSCOPY;  Service: Endoscopy;  Laterality: N/A;   ESOPHAGOGASTRODUODENOSCOPY (EGD) WITH PROPOFOL N/A 01/31/2023   Procedure: ESOPHAGOGASTRODUODENOSCOPY (EGD) WITH PROPOFOL;  Surgeon: Midge Minium, MD;  Location: [facility] ENDOSCOPY;  Service: Endoscopy;  Laterality: N/A;   FEMORAL ARTERY STENT Right    HERNIA REPAIR     x2   INTRAOCULAR LENS INSERTION     LEFT HEART CATH AND CORONARY ANGIOGRAPHY N/A 05/09/2017   Procedure: Left Heart Cath and Coronary Angiography;  Surgeon: Alwyn Pea, MD;  Location: [facility] INVASIVE CV LAB;  Service: Cardiovascular;  Laterality: N/A;   LEFT HEART CATH AND CORONARY ANGIOGRAPHY N/A 12/08/2018   Procedure: LEFT HEART CATH AND CORONARY ANGIOGRAPHY and possible PCI and stent;  Surgeon: Alwyn Pea, MD;  Location: [facility] INVASIVE CV LAB;  Service: Cardiovascular;  Laterality: N/A;   LEFT HEART CATH AND CORONARY ANGIOGRAPHY N/A 12/29/2019  Procedure: LEFT HEART CATH AND CORONARY ANGIOGRAPHY;  Surgeon: Yvonne Kendall, MD;  Location: [facility] INVASIVE CV LAB;  Service: Cardiovascular;  Laterality: N/A;   LEFT HEART CATH AND CORONARY ANGIOGRAPHY Left 12/13/2022   Procedure: LEFT HEART CATH AND CORONARY ANGIOGRAPHY;  Surgeon: Alwyn Pea, MD;  Location: [facility] INVASIVE CV LAB;  Service: Cardiovascular;  Laterality: Left;   NOSE SURGERY      submucous resection   PROSTATE SURGERY  12/10/2000   ROTATOR CUFF REPAIR Right     (Not in a hospital admission)  Social History   Socioeconomic History   Marital status: Married    Spouse name: Not on file   Number of children: 2   Years of education: College 3 years   Highest education level: Associate degree: occupational, Scientist, product/process development, or vocational program  Occupational History   Occupation: retired  Tobacco Use   Smoking status: Former    Current packs/day: 0.00    Types: Cigarettes    Start date: 12/10/1956    Quit date: 12/10/1976    Years since quitting: 46.9   Smokeless tobacco: Never  Vaping Use   Vaping status: Never Used  Substance and Sexual Activity   Alcohol use: No   Drug use: No   Sexual activity: Not Currently  Other Topics Concern   Not on file  Social History Narrative   Not on file   Social Determinants of Health   Financial Resource Strain: Low Risk  (06/21/2023)   Received from [facility]   Overall Financial Resource Strain (CARDIA)    Difficulty of Paying Living Expenses: Not very hard  Food Insecurity: No Food Insecurity (10/30/2023)   Hunger Vital Sign    Worried About Running Out of Food in the Last Year: Never true    Ran Out of Food in the Last Year: Never true  Transportation Needs: No Transportation Needs (10/30/2023)   PRAPARE - Administrator, Civil Service (Medical): No    Lack of Transportation (Non-Medical): No  Physical Activity: Insufficiently Active (09/15/2021)   Exercise Vital Sign    Days of Exercise per Week: 7 days    Minutes of Exercise per Session: 20 min  Stress: No Stress Concern Present (09/15/2021)   [employer] of Occupational Health - Occupational Stress Questionnaire    Feeling of Stress : Not at all  Social Connections: Moderately Isolated (09/15/2021)   Social Connection and Isolation Panel [NHANES]    Frequency of Communication with Friends and Family: Once a week    Frequency  of Social Gatherings with Friends and Family: Once a week    Attends Religious Services: More than 4 times per year    Active Member of [employer] or Organizations: No    Attends Banker Meetings: Never    Marital Status: Married  Catering manager Violence: Not At Risk (10/30/2023)   Humiliation, Afraid, Rape, and Kick questionnaire    Fear of Current or Ex-Partner: No    Emotionally Abused: No    Physically Abused: No    Sexually Abused: No    Family History  Problem Relation Age of Onset   Arthritis Mother    Stroke Maternal Grandfather    Breast cancer Neg Hx      Vitals:   10/31/23 0750 10/31/23 0822 10/31/23 1138 10/31/23 1141  BP:    (!) 127/53  Pulse:  71  65  Resp:  (!) 24  16  Temp:   98.3 F (36.8 C)  TempSrc:   Oral   SpO2: 95% 93%  98%  Height:        PHYSICAL EXAM General: Elderly male, well nourished, in no acute distress laying at an incline in hospital bed. HEENT: Normocephalic and atraumatic. Neck: No JVD.  Lungs: Normal respiratory effort on room air. Clear bilaterally to auscultation. No wheezes, crackles, rhonchi.  Heart: HRRR. Normal S1 and S2 without gallops or murmurs.  Abdomen: Non-distended appearing.  Msk: Normal strength and tone for age. Extremities: Warm and well perfused. No clubbing, cyanosis. No edema.  Neuro: Alert and oriented X 3. Psych: Answers questions appropriately.   Labs: Basic Metabolic Panel: Recent Labs    10/30/23 0531 10/31/23 0440  NA 132* 132*  K 4.4 4.8  CL 98 97*  CO2 25 24  GLUCOSE 199* 233*  BUN 77* 71*  CREATININE 4.03* 3.57*  CALCIUM 9.0 8.9  MG 2.6*  --    Liver Function Tests: Recent Labs    10/30/23 0531  AST 18  ALT 15  ALKPHOS 55  BILITOT 0.8  PROT 7.3  ALBUMIN 4.3   No results for input(s): "LIPASE", "AMYLASE" in the last 72 hours. CBC: Recent Labs    10/30/23 0426 10/31/23 0440  WBC 9.1 9.4  NEUTROABS 6.1  --   HGB 12.4* 11.7*  HCT 37.4* 35.3*  MCV 97.1 97.5  PLT 243  236   Cardiac Enzymes: Recent Labs    10/30/23 0644 10/30/23 1006 10/30/23 1143  TROPONINIHS 32* 43* 45*   BNP: Recent Labs    10/30/23 0426  BNP 110.8*   D-Dimer: No results for input(s): "DDIMER" in the last 72 hours. Hemoglobin A1C: Recent Labs    10/31/23 0440  HGBA1C 7.1*   Fasting Lipid Panel: No results for input(s): "CHOL", "HDL", "LDLCALC", "TRIG", "CHOLHDL", "LDLDIRECT" in the last 72 hours. Thyroid Function Tests: No results for input(s): "TSH", "T4TOTAL", "T3FREE", "THYROIDAB" in the last 72 hours.  Invalid input(s): "FREET3" Anemia Panel: No results for input(s): "VITAMINB12", "FOLATE", "FERRITIN", "TIBC", "IRON", "RETICCTPCT" in the last 72 hours.   Radiology: US RENAL  Result Date: 10/30/2023 CLINICAL DATA:  Acute kidney injury EXAM: RENAL / URINARY TRACT ULTRASOUND COMPLETE COMPARISON:  None Available. FINDINGS: Right Kidney: Renal measurements: 10.7 x 5.2 x 4.7 cm = volume: 136 mL. Echogenic renal parenchyma. 8 x 8 x 8 mm exophytic upper pole cyst. No hydronephrosis. Left Kidney: Renal measurements: 10.1 x 4.9 x 4.8 cm = volume: 124 mL. Echogenic renal parenchyma. No mass or hydronephrosis visualized. Bladder: Appears normal for degree of bladder distention. Other: None. IMPRESSION: Echogenic renal parenchyma, suggesting medical renal disease. 8 mm exophytic right upper pole cyst, benign. No hydronephrosis. Electronically Signed   By: Charline Bills M.D.   On: 10/30/2023 16:49   DG Chest Portable 1 View  Result Date: 10/30/2023 CLINICAL DATA:  82 year old male with sudden onset chest pain, pressure. Similar to 2 previous NSTEMI. EXAM: PORTABLE CHEST 1 VIEW COMPARISON:  Chest radiographs 09/29/2023. FINDINGS: Portable AP upright view at 0449 hours. Stable lung volumes and mediastinal contours. Lower mediastinal lipomatosis demonstrated on CT earlier this year. Subpleural lung scarring and some emphysema also demonstrated on that exam. But Allowing for  portable technique the lungs are clear. No pneumothorax or pleural effusion. Visualized tracheal air column is within normal limits. No acute osseous abnormality identified. IMPRESSION: No acute cardiopulmonary abnormality. Electronically Signed   By: Odessa Fleming M.D.   On: 10/30/2023 04:59    ECHO 04/2023: 1. Left ventricular ejection  fraction, by estimation, is 55 to 60%. The left ventricle has normal function. The left ventricle has no regional wall motion abnormalities. Left ventricular diastolic parameters are consistent with Grade I diastolic dysfunction (impaired relaxation).   2. Right ventricular systolic function is normal. The right ventricular size is normal. There is normal pulmonary artery systolic pressure.   3. The mitral valve is normal in structure. Mild to moderate mitral valve regurgitation. No evidence of mitral stenosis.   4. The aortic valve is normal in structure. Aortic valve regurgitation is not visualized. Moderate aortic valve stenosis.   5. The inferior vena cava is normal in size with greater than 50%  respiratory variability, suggesting right atrial pressure of 3 mmHg.   TELEMETRY reviewed by me 10/31/2023: sinus rhythm rate 60s  EKG reviewed by me: sinus rhythm rate 72 bpm, nonacute, stable from prior  Data reviewed by me 10/31/2023: last 24h vitals tele labs imaging I/O hospitalist progress note  Principal Problem:   Chest pain Active Problems:   Hyperlipidemia   NSTEMI (non-ST elevated myocardial infarction) (HCC)   History of stroke   Anemia of chronic renal failure, stage 4 (severe) (HCC)   Chronic obstructive pulmonary disease (HCC)   Gout   OSA (obstructive sleep apnea)   (HFpEF) heart failure with preserved ejection fraction (HCC)   Type II diabetes mellitus with renal manifestations (HCC)   Acute renal failure superimposed on stage 4 chronic kidney disease (HCC)   Atrial fibrillation, chronic (HCC)    ASSESSMENT AND PLAN:  Kevonne Diefenbach is a  82 y.o. male  with a past medical history of CAD s/p PCI w/ DES RCA, prox LCx, jailed OM 2 but good flow, HFpEF (EF 55-60% 08/2022), mild to moderate AS (mean grad 19 mmHg), CKD 4, type 2 diabetes, carotid stenosis, COPD, OSA on CPAP, Barrett's esophagus, history of prostate cancer who presented to the ED on 10/30/2023 for chest pain. Cardiology was consulted for further evaluation.   # Chest pain # Coronary artery disease s/p multiple prior stents Patient presenting with CP which woke him up around 2:30 AM, did not improve with NTG. Troponins mildly elevated 27 > 32 > 43 > 45. EKG stable from prior, nonacute.  -Increase metoprolol succinate to 100 mg daily. Continue imdur 120 mg twice daily and ranexa 1000 mg twice daily.  -Continue lipitor 80 mg daily, plavix 75 mg daily.  -No plan for further cardiac diagnostics.   # Chronic HFpEF # Moderate aortic stenosis Appears euvolemic on exam. BNP 110.  -Restart home meds as renal function allows.   # Paroxysmal atrial fibrillation Patient with hx of AF not on anticoagulation due to bleeding issues. Rate controlled on tele.  -See plan above for metoprolol.   # AKI on CKD IV Patient with chronic kidney disease with baseline Cr 2.5-3.0, with Cr 4.03 on presentation improved to 3.57 on AM labs today.  -Continue to monitor renal function closely. }   Ok for discharge today from a cardiac perspective. Will arrange for follow up in clinic with Dr. Juliann Pares in 1-2 weeks.    This patient's plan of care was discussed and created with Dr. Darrold Junker and he is in agreement.  Signed: Gale Journey, PA-C  10/31/2023, 1:43 PM [facility] Cardiology

## 2023-10-31 NOTE — Hospital Course (Addendum)
HPI: Mike Decker is a 82 y.o. male  with a past medical history of CAD s/p PCI w/ DES RCA, prox LCx, jailed OM 2 but good flow, HFpEF (EF 55-60% 08/2022), mild to moderate AS (mean grad 19 mmHg), CKD 4, type 2 diabetes, carotid stenosis, COPD, OSA on CPAP, Barrett's esophagus, history of prostate cancer who presented to the ED on 10/30/2023 for chest pain. Patient reports that at 2:30 AM he woke up with chest pain. States he waited 10 minutes but the pain persisted so he took 3 nitroglycerin 5 minutes apart with no relief. He states that he has chronic chest pain but this episode was worse than usual. He denies any exertional symptoms with this episode today. He called EMS.   Of note, Left heart cath done in 12/2022 demonstrated patent prior stents and diffuse disease not significant enough to indicate stenting.    Hospital course / significant events:  11/20: admitted to hospitalist service w/ cardiology consult. No heparin or cardiac intervention. Meds adjusted. AKI monitoring.  11/21: Cr improved but not quite back to baseline. Pt feeling some abd distension/SOB  11/22: significant SOB and dizziness on ambulation, likely d/t medication dosage adjustment w/ beta blocker. Eval for O2 need   Consultants:  Cardiology  Procedures/Surgeries: None       ASSESSMENT & PLAN:   Chest pain Coronary artery disease s/p multiple prior stents Troponins mildly elevated 27 > 32 > 43 > 45.  EKG stable from prior, nonacute.  Cardiology following - pt is clear fir discharge from their perspective   metoprolol 100 mg twice daily increased to 150 mg bid and pt was dizzy, will reduce back down  imdur to 120 mg twice daily.  ranexa 1000 mg twice daily.  lipitor 80 mg daily plavix 75 mg daily.  No plan for further cardiac diagnostics.    Chronic HFpEF Moderate aortic stenosis Appears euvolemic on exam. BNP 110.  Torsemide 40 mg daily    Chronic atrial fibrillation Patient with chronic AF not  on anticoagulation due to bleeding issues. Rate controlled on tele.  See plan above for metoprolol.    AKI on CKD IV Patient with chronic kidney disease with baseline Cr 2.5-3.0, with Cr 4.03 on presentation, improved to 3.6 today Continue to monitor renal function closely.    obese based on BMI: Body mass index is 31.3 kg/m.  Underweight - under 18.5  normal weight - 18.5 to 24.9 overweight - 25 to 29.9 obese - 30 or more   DVT prophylaxis: heparin sq IV fluids: no continuous IV fluids  Nutrition: cardiac/carb diet  Central lines / invasive devices: none  Code Status: FULL CODE ACP documentation reviewed: 10/31/23 and has advanced directive on file in [facility]  TOC needs: none at this time  Barriers to dispo / significant pending items: AKI, if creatinine continuing to trend appropriately and ambulating well, anticipate d/c tomorrow

## 2023-10-31 NOTE — Progress Notes (Signed)
PROGRESS NOTE    Mike Decker   PPI:[id number] DOB: 10-Feb-1941  DOA: 10/30/2023 Date of Service: 10/31/23 which is hospital day 1  PCP: [name], MD    HPI: Mike Decker is a 82 y.o. male  with a past medical history of CAD s/p PCI w/ DES RCA, prox LCx, jailed OM 2 but good flow, HFpEF (EF 55-60% 08/2022), mild to moderate AS (mean grad 19 mmHg), CKD 4, type 2 diabetes, carotid stenosis, COPD, OSA on CPAP, Barrett's esophagus, history of prostate cancer who presented to the ED on 10/30/2023 for chest pain. Patient reports that at 2:30 AM he woke up with chest pain. States he waited 10 minutes but the pain persisted so he took 3 nitroglycerin 5 minutes apart with no relief. He states that he has chronic chest pain but this episode was worse than usual. He denies any exertional symptoms with this episode today. He called EMS.   Of note, Left heart cath done in 12/2022 demonstrated patent prior stents and diffuse disease not significant enough to indicate stenting.    Hospital course / significant events:  11/20: admitted to hospitalist service w/ cardiology consult. No heparin or cardiac intervention. Meds adjusted. AKI monitoring.  11/21: Cr improved but not quite back to baseline. Pt feeling some abd distension/SOB   Consultants:  Cardiology  Procedures/Surgeries: None       ASSESSMENT & PLAN:   Chest pain Coronary artery disease s/p multiple prior stents Troponins mildly elevated 27 > 32 > 43 > 45.  EKG stable from prior, nonacute.  Cardiology consulted  Increase metoprolol to 100 mg twice daily.  Increase imdur to 120 mg twice daily.  Continue ranexa 1000 mg twice daily.  Continue lipitor 80 mg daily Continue plavix 75 mg daily.  No plan for further cardiac diagnostics.    Chronic HFpEF Moderate aortic stenosis Appears euvolemic on exam. BNP 110.  Restart home meds as renal function allows (jardiance 10 mg daily, torsemide 40 mg bid)   Chronic  atrial fibrillation Patient with chronic AF not on anticoagulation due to bleeding issues. Rate controlled on tele.  See plan above for metoprolol.    AKI on CKD IV Patient with chronic kidney disease with baseline Cr 2.5-3.0, with Cr 4.03 on presentation, improved to 3.6 today Continue to monitor renal function closely.    obese based on BMI: Body mass index is 31.3 kg/m.  Underweight - under 18.5  normal weight - 18.5 to 24.9 overweight - 25 to 29.9 obese - 30 or more   DVT prophylaxis: heparin sq IV fluids: no continuous IV fluids  Nutrition: cardiac/carb diet  Central lines / invasive devices: none  Code Status: FULL CODE ACP documentation reviewed: 10/31/23 and has advanced directive on file in [facility]  TOC needs: none at this time  Barriers to dispo / significant pending items: AKI, if creatinine continuing to trend appropriately and ambulating well, anticipate d/c tomorrow              Subjective / Brief ROS:  Patient reports feeling some bloating and SOB but overall better  Still come intermittent chest pressure Pain controlled.  Denies new weakness.  Tolerating diet.  Reports no concerns w/ urination/defecation.   Family Communication: none at this time     Objective Findings:  Vitals:   10/31/23 0750 10/31/23 0822 10/31/23 1138 10/31/23 1141  BP:    (!) 127/53  Pulse:  71  65  Resp:  (!) 24  16  Temp:   98.3 F (36.8 C)   TempSrc:   Oral   SpO2: 95% 93%  98%  Height:        Intake/Output Summary (Last 24 hours) at 10/31/2023 1418 Last data filed at 10/31/2023 1329 Gross per 24 hour  Intake --  Output 800 ml  Net -800 ml   There were no vitals filed for this visit.  Examination:  Physical Exam Constitutional:      General: He is not in acute distress.    Appearance: He is well-developed. He is obese.  Cardiovascular:     Rate and Rhythm: Normal rate and regular rhythm.     Heart sounds: Murmur heard.  Pulmonary:     Effort:  Pulmonary effort is normal.     Breath sounds: Normal breath sounds.  Neurological:     General: No focal deficit present.     Mental Status: He is alert and oriented to person, place, and time.  Psychiatric:        Mood and Affect: Mood normal.        Behavior: Behavior normal.          Scheduled Medications:   arformoterol  15 mcg Nebulization BID   And   umeclidinium bromide  1 puff Inhalation Daily   atorvastatin  80 mg Oral Daily   calcitRIOL  0.25 mcg Oral Daily   citalopram  10 mg Oral Daily   clopidogrel  75 mg Oral Daily   heparin  5,000 Units Subcutaneous Q8H   insulin aspart  0-9 Units Subcutaneous TID AC & HS   insulin glargine-yfgn  50 Units Subcutaneous QHS   isosorbide mononitrate  120 mg Oral BID   metoprolol succinate  100 mg Oral Daily   montelukast  10 mg Oral QHS   pantoprazole  40 mg Oral BID AC   ranolazine  1,000 mg Oral Daily   sodium bicarbonate  650 mg Oral BID    Continuous Infusions:   PRN Medications:  acetaminophen, ondansetron (ZOFRAN) IV  Antimicrobials from admission:  Anti-infectives (From admission, onward)    None           Data Reviewed:  I have personally reviewed the following...  CBC: Recent Labs  Lab 10/30/23 0426 10/31/23 0440  WBC 9.1 9.4  NEUTROABS 6.1  --   HGB 12.4* 11.7*  HCT 37.4* 35.3*  MCV 97.1 97.5  PLT 243 236   Basic Metabolic Panel: Recent Labs  Lab 10/30/23 0531 10/31/23 0440  NA 132* 132*  K 4.4 4.8  CL 98 97*  CO2 25 24  GLUCOSE 199* 233*  BUN 77* 71*  CREATININE 4.03* 3.57*  CALCIUM 9.0 8.9  MG 2.6*  --    GFR: CrCl cannot be calculated (Unknown ideal weight.). Liver Function Tests: Recent Labs  Lab 10/30/23 0531  AST 18  ALT 15  ALKPHOS 55  BILITOT 0.8  PROT 7.3  ALBUMIN 4.3   No results for input(s): "LIPASE", "AMYLASE" in the last 168 hours. No results for input(s): "AMMONIA" in the last 168 hours. Coagulation Profile: No results for input(s): "INR",  "PROTIME" in the last 168 hours. Cardiac Enzymes: No results for input(s): "CKTOTAL", "CKMB", "CKMBINDEX", "TROPONINI" in the last 168 hours. BNP (last 3 results) No results for input(s): "PROBNP" in the last 8760 hours. HbA1C: Recent Labs    10/31/23 0440  HGBA1C 7.1*   CBG: Recent Labs  Lab 10/30/23 2248 10/31/23 0806 10/31/23 1355  GLUCAP 238* 228* 294*  Lipid Profile: No results for input(s): "CHOL", "HDL", "LDLCALC", "TRIG", "CHOLHDL", "LDLDIRECT" in the last 72 hours. Thyroid Function Tests: No results for input(s): "TSH", "T4TOTAL", "FREET4", "T3FREE", "THYROIDAB" in the last 72 hours. Anemia Panel: No results for input(s): "VITAMINB12", "FOLATE", "FERRITIN", "TIBC", "IRON", "RETICCTPCT" in the last 72 hours. Most Recent Urinalysis On File:     Component Value Date/Time   COLORURINE YELLOW (A) 04/18/2023 0908   APPEARANCEUR CLEAR (A) 04/18/2023 0908   LABSPEC 1.008 04/18/2023 0908   PHURINE 5.0 04/18/2023 0908   GLUCOSEU >=500 (A) 04/18/2023 0908   HGBUR NEGATIVE 04/18/2023 0908   BILIRUBINUR NEGATIVE 04/18/2023 0908   BILIRUBINUR 1+ 11/01/2020 1512   KETONESUR NEGATIVE 04/18/2023 0908   PROTEINUR NEGATIVE 04/18/2023 0908   UROBILINOGEN 2.0 (A) 11/01/2020 1512   NITRITE NEGATIVE 04/18/2023 0908   LEUKOCYTESUR NEGATIVE 04/18/2023 0908   Sepsis Labs: @LABRCNTIP (procalcitonin:4,lacticidven:4) Microbiology: No results found for this or any previous visit (from the past 240 hour(s)).    Radiology Studies last 3 days: US RENAL  Result Date: 10/30/2023 CLINICAL DATA:  Acute kidney injury EXAM: RENAL / URINARY TRACT ULTRASOUND COMPLETE COMPARISON:  None Available. FINDINGS: Right Kidney: Renal measurements: 10.7 x 5.2 x 4.7 cm = volume: 136 mL. Echogenic renal parenchyma. 8 x 8 x 8 mm exophytic upper pole cyst. No hydronephrosis. Left Kidney: Renal measurements: 10.1 x 4.9 x 4.8 cm = volume: 124 mL. Echogenic renal parenchyma. No mass or hydronephrosis visualized.  Bladder: Appears normal for degree of bladder distention. Other: None. IMPRESSION: Echogenic renal parenchyma, suggesting medical renal disease. 8 mm exophytic right upper pole cyst, benign. No hydronephrosis. Electronically Signed   By: Charline Bills M.D.   On: 10/30/2023 16:49   DG Chest Portable 1 View  Result Date: 10/30/2023 CLINICAL DATA:  82 year old male with sudden onset chest pain, pressure. Similar to 2 previous NSTEMI. EXAM: PORTABLE CHEST 1 VIEW COMPARISON:  Chest radiographs 09/29/2023. FINDINGS: Portable AP upright view at 0449 hours. Stable lung volumes and mediastinal contours. Lower mediastinal lipomatosis demonstrated on CT earlier this year. Subpleural lung scarring and some emphysema also demonstrated on that exam. But Allowing for portable technique the lungs are clear. No pneumothorax or pleural effusion. Visualized tracheal air column is within normal limits. No acute osseous abnormality identified. IMPRESSION: No acute cardiopulmonary abnormality. Electronically Signed   By: Odessa Fleming M.D.   On: 10/30/2023 04:59          Sunnie Nielsen, DO [facility] 10/31/2023, 2:18 PM    Dictation software may have been used to generate the above note. Typos may occur and escape review in typed/dictated notes. Please contact Dr Lyn Hollingshead directly for clarity if needed.  Staff may message me via secure chat in Epic  but this may not receive an immediate response,  please page me for urgent matters!  If 7PM-7AM, please contact night coverage [email or web address]

## 2023-11-01 DIAGNOSIS — R079 Chest pain, unspecified: Secondary | ICD-10-CM

## 2023-11-01 LAB — GLUCOSE, CAPILLARY
Glucose-Capillary: 191 mg/dL — ABNORMAL HIGH (ref 70–99)
Glucose-Capillary: 340 mg/dL — ABNORMAL HIGH (ref 70–99)
Glucose-Capillary: 169 mg/dL — ABNORMAL HIGH (ref 70–99)
Glucose-Capillary: 213 mg/dL — ABNORMAL HIGH (ref 70–99)

## 2023-11-01 LAB — BASIC METABOLIC PANEL
Anion gap: 13 (ref 5–15)
BUN: 80 mg/dL — ABNORMAL HIGH (ref 8–23)
CO2: 23 mmol/L (ref 22–32)
Calcium: 9 mg/dL (ref 8.9–10.3)
Chloride: 98 mmol/L (ref 98–111)
Creatinine, Ser: 3.09 mg/dL — ABNORMAL HIGH (ref 0.61–1.24)
GFR, Estimated: 19 mL/min — ABNORMAL LOW (ref >60)
Glucose, Bld: 170 mg/dL — ABNORMAL HIGH (ref 70–99)
Potassium: 4.1 mmol/L (ref 3.5–5.1)
Sodium: 134 mmol/L — ABNORMAL LOW (ref 135–145)

## 2023-11-01 MED ORDER — METOPROLOL SUCCINATE ER 100 MG PO TB24: 100.0000 mg | ORAL_TABLET | Freq: Every day | ORAL | Status: DC

## 2023-11-01 MED ORDER — METOPROLOL SUCCINATE ER 50 MG PO TB24: 150.0000 mg | ORAL_TABLET | Freq: Every day | ORAL | Status: DC

## 2023-11-01 MED ORDER — NITROGLYCERIN 0.4 MG SL SUBL
0.4000 mg | SUBLINGUAL_TABLET | SUBLINGUAL | Status: DC | PRN
  Administered 2023-11-02: 0.4 mg via SUBLINGUAL
  Filled 2023-11-01: qty 1

## 2023-11-01 MED ORDER — POLYETHYLENE GLYCOL 3350 17 G PO PACK
17.0000 g | PACK | Freq: Every day | ORAL | Status: DC
  Administered 2023-11-01 – 2023-11-03 (×3): 17 g via ORAL
  Filled 2023-11-01 (×3): qty 1

## 2023-11-01 MED ORDER — ACETAMINOPHEN 325 MG PO TABS
650.0000 mg | ORAL_TABLET | ORAL | Status: DC | PRN
  Administered 2023-11-01 (×2): 650 mg via ORAL
  Filled 2023-11-01 (×2): qty 2

## 2023-11-01 MED ORDER — SENNOSIDES-DOCUSATE SODIUM 8.6-50 MG PO TABS
2.0000 | ORAL_TABLET | Freq: Once | ORAL | Status: AC
Start: 2023-11-01 — End: 2023-11-01
  Administered 2023-11-01: 2 via ORAL
  Filled 2023-11-01: qty 2

## 2023-11-01 MED ORDER — TORSEMIDE 20 MG PO TABS
40.0000 mg | ORAL_TABLET | Freq: Every day | ORAL | Status: DC
  Administered 2023-11-01: 40 mg via ORAL
  Filled 2023-11-01: qty 2

## 2023-11-01 MED ORDER — BISACODYL 10 MG RE SUPP: 10.0000 mg | Freq: Every day | RECTAL | Status: DC | PRN

## 2023-11-01 NOTE — Progress Notes (Signed)
Pt attempted to walk in the halls. Complains of dizziness and sat back down.

## 2023-11-01 NOTE — Plan of Care (Signed)
Problem: Education: Goal: Understanding of cardiac disease, CV risk reduction, and recovery process will improve Outcome: Progressing Goal: Individualized Educational Video(s) Outcome: Progressing   Problem: Activity: Goal: Ability to tolerate increased activity will improve Outcome: Progressing

## 2023-11-01 NOTE — Progress Notes (Signed)
[facility] PROGRESS NOTE       Patient ID: Rodriguez Sedore MRN: [id number] DOB/AGE: 1941/02/22 82 y.o.  Admit date: 10/30/2023 Referring Physician Dr. Doree Albee Primary Physician [name], MD  Primary Cardiologist Dr. Juliann Pares Reason for Consultation chest pain  HPI: [facility] is a 82 y.o. male  with a past medical history of CAD s/p PCI w/ DES RCA, prox LCx, jailed OM 2 but good flow, HFpEF (EF 55-60% 08/2022), mild to moderate AS (mean grad 19 mmHg), CKD 4, type 2 diabetes, carotid stenosis, COPD, OSA on CPAP, Barrett's esophagus, history of prostate cancer who presented to the ED on 10/30/2023 for chest pain. Cardiology was consulted for further evaluation.   Interval history: -Patient reports he is feeling well overall. Reports an episode of CP last night that was not as severe as priors and resolved with one dose of SL NTG.   -BP and HR controlled. Walked around unit and tolerated this well, did have mild SOB. States abdominal fullness is improved.  -Renal function continues to improve. Tolerating torsemide, reports good UOP.  Review of systems complete and found to be negative unless listed above    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, esphageal   Chronic diastolic CHF (congestive heart failure) (HCC)    Colon polyp    Diabetes mellitus without complication (HCC)    Diverticulosis    Gout    Heart disease    Hemangioma    liver   Hyperlipidemia    Hypertension    Myocardial infarct [facility])    Ocular hypertension    Peripheral vascular disease (HCC)    Skin cancer    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HCC)    Sleep apnea    Vitreoretinal degeneration     Past Surgical History:  Procedure Laterality Date   CATARACT EXTRACTION  2011, 2012   COLONOSCOPY N/A 12/14/2018   Procedure: COLONOSCOPY;   Surgeon: Pasty Spillers, MD;  Location: [facility] ENDOSCOPY;  Service: Endoscopy;  Laterality: N/A;   CORONARY ANGIOPLASTY WITH STENT PLACEMENT  12/10/2010   CORONARY STENT INTERVENTION N/A 12/29/2019   Procedure: CORONARY STENT INTERVENTION;  Surgeon: Yvonne Kendall, MD;  Location: [facility] INVASIVE CV LAB;  Service: Cardiovascular;  Laterality: N/A;   ESOPHAGOGASTRODUODENOSCOPY N/A 12/14/2018   Procedure: ESOPHAGOGASTRODUODENOSCOPY (EGD);  Surgeon: Pasty Spillers, MD;  Location: [facility] ENDOSCOPY;  Service: Endoscopy;  Laterality: N/A;   ESOPHAGOGASTRODUODENOSCOPY (EGD) WITH PROPOFOL N/A 06/01/2016   Procedure: ESOPHAGOGASTRODUODENOSCOPY (EGD) WITH PROPOFOL;  Surgeon: Scot Jun, MD;  [facility];  Service: Endoscopy;  Laterality: N/A;   ESOPHAGOGASTRODUODENOSCOPY (EGD) WITH PROPOFOL N/A 01/31/2023   Procedure: ESOPHAGOGASTRODUODENOSCOPY (EGD) WITH PROPOFOL;  Surgeon: Midge Minium, MD;  Location: [facility] ENDOSCOPY;  Service: Endoscopy;  Laterality: N/A;   FEMORAL ARTERY STENT Right    HERNIA REPAIR     x2   INTRAOCULAR LENS INSERTION     LEFT HEART CATH AND CORONARY ANGIOGRAPHY N/A 05/09/2017   Procedure: Left Heart Cath and Coronary Angiography;  Surgeon: Alwyn Pea, MD;  Location: [facility] INVASIVE CV LAB;  Service: Cardiovascular;  Laterality: N/A;   LEFT HEART CATH AND CORONARY ANGIOGRAPHY N/A 12/08/2018   Procedure: LEFT HEART CATH AND CORONARY ANGIOGRAPHY and possible PCI and stent;  Surgeon: Alwyn Pea, MD;  Location: [facility] INVASIVE  CV LAB;  Service: Cardiovascular;  Laterality: N/A;   LEFT HEART CATH AND CORONARY ANGIOGRAPHY N/A 12/29/2019   Procedure: LEFT HEART CATH AND CORONARY ANGIOGRAPHY;  Surgeon: Yvonne Kendall, MD;  Location: [facility] INVASIVE CV LAB;  Service: Cardiovascular;  Laterality: N/A;   LEFT HEART CATH AND CORONARY ANGIOGRAPHY Left 12/13/2022   Procedure: LEFT HEART CATH AND CORONARY ANGIOGRAPHY;  Surgeon: Alwyn Pea, MD;  Location:  [facility] INVASIVE CV LAB;  Service: Cardiovascular;  Laterality: Left;   NOSE SURGERY     submucous resection   PROSTATE SURGERY  12/10/2000   ROTATOR CUFF REPAIR Right     Medications Prior to Admission  Medication Sig Dispense Refill Last Dose   acetaminophen (TYLENOL) 500 MG tablet Take 1,000 mg by mouth every 8 (eight) hours as needed for mild pain.   prn   allopurinol (ZYLOPRIM) 100 MG tablet Take 100 mg by mouth daily.   10/29/2023   atorvastatin (LIPITOR) 80 MG tablet Take 1 tablet (80 mg total) by mouth daily. 30 tablet 0 10/29/2023   azelastine (ASTELIN) 0.1 % nasal spray USE 1 TO 2 SPRAYS IN EACH NOSTRIL TWICE A DAY      cetirizine (ZYRTEC) 5 MG tablet Take 5 mg by mouth daily.   10/29/2023   citalopram (CELEXA) 10 MG tablet Take 1 tablet (10 mg total) by mouth daily. 90 tablet 3 10/29/2023   clopidogrel (PLAVIX) 75 MG tablet TAKE 1 TABLET BY MOUTH EVERY DAY 90 tablet 1 10/29/2023 at 0900   colchicine 0.6 MG tablet Take 3 tablets (1.8 mg total) by mouth daily as needed (gout flares).   prn   [employer] VITAMIN B12 1000 MCG tablet TAKE 1 TABLET BY MOUTH EVERY DAY 30 tablet 0 10/29/2023   EPINEPHrine (EPI-PEN) 0.3 mg/0.3 mL SOAJ injection Inject into the muscle as needed (anaphylaxis).      ergocalciferol (VITAMIN D2) 1.25 MG (50000 UT) capsule Take 50,000 Units by mouth every 30 (thirty) days.   10/29/2023   ferrous sulfate 325 (65 FE) MG EC tablet TAKE 1 TABLET BY MOUTH EVERY DAY 90 tablet 3 10/29/2023   fluticasone (FLONASE) 50 MCG/ACT nasal spray USE 1 SPRAY IN EACH NOSTRIL EVERY DAY 32 g 6 10/29/2023   folic acid (FOLVITE) 400 MCG tablet Take 400 mcg by mouth daily.   Past Month   hydrALAZINE (APRESOLINE) 50 MG tablet Take 50 mg by mouth 2 (two) times daily.   10/29/2023   insulin aspart (NOVOLOG) 100 UNIT/ML FlexPen 10 UNITS IN AM, 30 UNITS AT LUNCH, 30 UNITS AT [facility]   10/29/2023   insulin glargine (LANTUS) 100 UNIT/ML injection Inject 0.35 mLs (35 Units total) into the skin at bedtime.  This is a decrease from your previous 45 units nightly. (Patient taking differently: Inject 54 Units into the skin at bedtime.) 10 mL 11 10/29/2023   isosorbide mononitrate (IMDUR) 60 MG 24 hr tablet Take 1 tablet (60 mg total) by mouth daily. (Patient taking differently: Take 120 mg by mouth daily.) 30 tablet 0 10/29/2023   JARDIANCE 10 MG TABS tablet Take 10 mg by mouth daily.   10/29/2023   magnesium oxide (MAG-OX) 400 (240 Mg) MG tablet Take 1 tablet by mouth daily.   10/29/2023   metoprolol succinate (TOPROL-XL) 50 MG 24 hr tablet Take 1 tablet by mouth daily.   10/29/2023   mometasone (ASMANEX) 220 MCG/ACT inhaler Inhale 2 puffs into the lungs daily.   10/29/2023   montelukast (SINGULAIR) 10 MG tablet TAKE 1 TABLET AT BEDTIME  90 tablet 3 Past Month   nitroGLYCERIN (NITROSTAT) 0.4 MG SL tablet Place 0.4 mg under the tongue every 5 (five) minutes x 3 doses as needed for chest pain.   prn   pantoprazole (PROTONIX) 40 MG tablet Take 1 tablet (40 mg total) by mouth 2 (two) times daily before a meal.   10/29/2023   ranolazine (RANEXA) 500 MG 12 hr tablet Take 1 tablet (500 mg total) by mouth daily. (Patient taking differently: Take 1,000 mg by mouth daily.) 30 tablet 0 10/29/2023   sodium bicarbonate 650 MG tablet Take 650 mg by mouth 2 (two) times daily.   10/29/2023   Tiotropium Bromide-Olodaterol (STIOLTO RESPIMAT) 2.5-2.5 MCG/ACT AERS Inhale 1 each into the lungs daily.   10/29/2023   Torsemide 40 MG TABS Take 40 mg by mouth 2 (two) times daily.   10/29/2023   calcitRIOL (ROCALTROL) 0.25 MCG capsule Take 0.25 mcg by mouth daily.      sodium fluoride (DENTA 5000 PLUS) 1.1 % CREA dental cream See admin instructions.      Social History   Socioeconomic History   Marital status: Married    Spouse name: Not on file   Number of children: 2   Years of education: College 3 years   Highest education level: Associate degree: occupational, Scientist, product/process development, or vocational program  Occupational History    Occupation: retired  Tobacco Use   Smoking status: Former    Current packs/day: 0.00    Types: Cigarettes    Start date: 12/10/1956    Quit date: 12/10/1976    Years since quitting: 46.9   Smokeless tobacco: Never  Vaping Use   Vaping status: Never Used  Substance and Sexual Activity   Alcohol use: No   Drug use: No   Sexual activity: Not Currently  Other Topics Concern   Not on file  Social History Narrative   Not on file   Social Determinants of Health   Financial Resource Strain: Low Risk  (06/21/2023)   Received from [facility]   Overall Financial Resource Strain (CARDIA)    Difficulty of Paying Living Expenses: Not very hard  Food Insecurity: No Food Insecurity (10/30/2023)   Hunger Vital Sign    Worried About Running Out of Food in the Last Year: Never true    Ran Out of Food in the Last Year: Never true  Transportation Needs: No Transportation Needs (10/30/2023)   PRAPARE - Administrator, Civil Service (Medical): No    Lack of Transportation (Non-Medical): No  Physical Activity: Insufficiently Active (09/15/2021)   Exercise Vital Sign    Days of Exercise per Week: 7 days    Minutes of Exercise per Session: 20 min  Stress: No Stress Concern Present (09/15/2021)   [employer] of Occupational Health - Occupational Stress Questionnaire    Feeling of Stress : Not at all  Social Connections: Moderately Isolated (09/15/2021)   Social Connection and Isolation Panel [NHANES]    Frequency of Communication with Friends and Family: Once a week    Frequency of Social Gatherings with Friends and Family: Once a week    Attends Religious Services: More than 4 times per year    Active Member of [employer] or Organizations: No    Attends Banker Meetings: Never    Marital Status: Married  Catering manager Violence: Not At Risk (10/30/2023)   Humiliation, Afraid, Rape, and Kick questionnaire    Fear of Current or Ex-Partner: No  Emotionally Abused: No    Physically Abused: No    Sexually Abused: No    Family History  Problem Relation Age of Onset   Arthritis Mother    Stroke Maternal Grandfather    Breast cancer Neg Hx      Vitals:   11/01/23 0344 11/01/23 0725 11/01/23 0826 11/01/23 1207  BP: (!) 146/58  125/60 (!) 121/56  Pulse: 60  61 64  Resp: 18     Temp: 98.2 F (36.8 C)  98.1 F (36.7 C) 97.8 F (36.6 C)  TempSrc:      SpO2: 99% 95% 93% 98%  Weight:      Height:        PHYSICAL EXAM General: Elderly male, well nourished, in no acute distress laying at an incline in hospital bed. HEENT: Normocephalic and atraumatic. Neck: No JVD.  Lungs: Normal respiratory effort on room air. Clear bilaterally to auscultation. No wheezes, crackles, rhonchi.  Heart: HRRR. Normal S1 and S2 without gallops or murmurs.  Abdomen: Non-distended appearing.  Msk: Normal strength and tone for age. Extremities: Warm and well perfused. No clubbing, cyanosis. No edema.  Neuro: Alert and oriented X 3. Psych: Answers questions appropriately.   Labs: Basic Metabolic Panel: Recent Labs    10/30/23 0531 10/31/23 0440 11/01/23 0454  NA 132* 132* 134*  K 4.4 4.8 4.1  CL 98 97* 98  CO2 25 24 23   GLUCOSE 199* 233* 170*  BUN 77* 71* 80*  CREATININE 4.03* 3.57* 3.09*  CALCIUM 9.0 8.9 9.0  MG 2.6*  --   --    Liver Function Tests: Recent Labs    10/30/23 0531  AST 18  ALT 15  ALKPHOS 55  BILITOT 0.8  PROT 7.3  ALBUMIN 4.3   No results for input(s): "LIPASE", "AMYLASE" in the last 72 hours. CBC: Recent Labs    10/30/23 0426 10/31/23 0440  WBC 9.1 9.4  NEUTROABS 6.1  --   HGB 12.4* 11.7*  HCT 37.4* 35.3*  MCV 97.1 97.5  PLT 243 236   Cardiac Enzymes: Recent Labs    10/30/23 0644 10/30/23 1006 10/30/23 1143  TROPONINIHS 32* 43* 45*   BNP: Recent Labs    10/30/23 0426  BNP 110.8*   D-Dimer: No results for input(s): "DDIMER" in the last 72 hours. Hemoglobin A1C: Recent Labs     10/31/23 0440  HGBA1C 7.1*   Fasting Lipid Panel: No results for input(s): "CHOL", "HDL", "LDLCALC", "TRIG", "CHOLHDL", "LDLDIRECT" in the last 72 hours. Thyroid Function Tests: No results for input(s): "TSH", "T4TOTAL", "T3FREE", "THYROIDAB" in the last 72 hours.  Invalid input(s): "FREET3" Anemia Panel: No results for input(s): "VITAMINB12", "FOLATE", "FERRITIN", "TIBC", "IRON", "RETICCTPCT" in the last 72 hours.   Radiology: US RENAL  Result Date: 10/30/2023 CLINICAL DATA:  Acute kidney injury EXAM: RENAL / URINARY TRACT ULTRASOUND COMPLETE COMPARISON:  None Available. FINDINGS: Right Kidney: Renal measurements: 10.7 x 5.2 x 4.7 cm = volume: 136 mL. Echogenic renal parenchyma. 8 x 8 x 8 mm exophytic upper pole cyst. No hydronephrosis. Left Kidney: Renal measurements: 10.1 x 4.9 x 4.8 cm = volume: 124 mL. Echogenic renal parenchyma. No mass or hydronephrosis visualized. Bladder: Appears normal for degree of bladder distention. Other: None. IMPRESSION: Echogenic renal parenchyma, suggesting medical renal disease. 8 mm exophytic right upper pole cyst, benign. No hydronephrosis. Electronically Signed   By: Charline Bills M.D.   On: 10/30/2023 16:49   DG Chest Portable 1 View  Result Date: 10/30/2023 CLINICAL DATA:  82 year old male with sudden onset chest pain, pressure. Similar to 2 previous NSTEMI. EXAM: PORTABLE CHEST 1 VIEW COMPARISON:  Chest radiographs 09/29/2023. FINDINGS: Portable AP upright view at 0449 hours. Stable lung volumes and mediastinal contours. Lower mediastinal lipomatosis demonstrated on CT earlier this year. Subpleural lung scarring and some emphysema also demonstrated on that exam. But Allowing for portable technique the lungs are clear. No pneumothorax or pleural effusion. Visualized tracheal air column is within normal limits. No acute osseous abnormality identified. IMPRESSION: No acute cardiopulmonary abnormality. Electronically Signed   By: Odessa Fleming M.D.   On:  10/30/2023 04:59    ECHO 04/2023: 1. Left ventricular ejection fraction, by estimation, is 55 to 60%. The left ventricle has normal function. The left ventricle has no regional wall motion abnormalities. Left ventricular diastolic parameters are consistent with Grade I diastolic dysfunction (impaired relaxation).   2. Right ventricular systolic function is normal. The right ventricular size is normal. There is normal pulmonary artery systolic pressure.   3. The mitral valve is normal in structure. Mild to moderate mitral valve regurgitation. No evidence of mitral stenosis.   4. The aortic valve is normal in structure. Aortic valve regurgitation is not visualized. Moderate aortic valve stenosis.   5. The inferior vena cava is normal in size with greater than 50%  respiratory variability, suggesting right atrial pressure of 3 mmHg.   TELEMETRY reviewed by me 11/01/2023: sinus rhythm 1st degree AVB rate 60s  EKG reviewed by me: sinus rhythm rate 72 bpm, nonacute, stable from prior  Data reviewed by me 11/01/2023: last 24h vitals tele labs imaging I/O hospitalist progress note  Principal Problem:   Chest pain Active Problems:   Hyperlipidemia   NSTEMI (non-ST elevated myocardial infarction) (HCC)   History of stroke   Anemia of chronic renal failure, stage 4 (severe) (HCC)   Chronic obstructive pulmonary disease (HCC)   Gout   OSA (obstructive sleep apnea)   (HFpEF) heart failure with preserved ejection fraction (HCC)   Type II diabetes mellitus with renal manifestations (HCC)   Acute renal failure superimposed on stage 4 chronic kidney disease (HCC)   Atrial fibrillation, chronic (HCC)    ASSESSMENT AND PLAN:  [facility] is a 82 y.o. male  with a past medical history of CAD s/p PCI w/ DES RCA, prox LCx, jailed OM 2 but good flow, HFpEF (EF 55-60% 08/2022), mild to moderate AS (mean grad 19 mmHg), CKD 4, type 2 diabetes, carotid stenosis, COPD, OSA on CPAP, Barrett's esophagus,  history of prostate cancer who presented to the ED on 10/30/2023 for chest pain. Cardiology was consulted for further evaluation.   # Chest pain # Coronary artery disease s/p multiple prior stents Patient presenting with CP which woke him up around 2:30 AM, did not improve with NTG. Troponins mildly elevated 27 > 32 > 43 > 45. EKG stable from prior, nonacute.  -Increase metoprolol succinate to 150 mg daily. Continue imdur 120 mg twice daily and ranexa 1000 mg twice daily.  -Continue lipitor 80 mg daily, plavix 75 mg daily.  -No plan for further cardiac diagnostics.   # Chronic HFpEF # Moderate aortic stenosis Appears euvolemic on exam. BNP 110.  -Continue torsemide 40 mg daily.   # Paroxysmal atrial fibrillation Patient with hx of AF not on anticoagulation due to bleeding issues. Rate controlled on tele.  -See plan above for metoprolol.   # AKI on CKD IV Patient with chronic kidney disease with baseline Cr 2.5-3.0, with  Cr 4.03 on admission improved to 3.09 on AM labs today.  -Continue to monitor renal function closely. }   Ok for discharge today vs tomorrow from a cardiac perspective. Will arrange for follow up in clinic with Dr. Juliann Pares in 1-2 weeks.    This patient's plan of care was discussed and created with Dr. Darrold Junker and he is in agreement.  Signed: Gale Journey, PA-C  11/01/2023, 12:59 PM [facility]

## 2023-11-01 NOTE — Inpatient Diabetes Management (Signed)
Inpatient Diabetes Program Recommendations  AACE/ADA: New Consensus Statement on Inpatient Glycemic Control (2015)  Target Ranges:  Prepandial:   less than 140 mg/dL      Peak postprandial:   less than 180 mg/dL (1-2 hours)      Critically ill patients:  140 - 180 mg/dL    Latest Reference Range & Units 11/01/23 08:28 11/01/23 12:09  Glucose-Capillary 70 - 99 mg/dL 161 (H)  3 units Novolog  340 (H)  7 units Novolog   (H): Data is abnormally high     Home DM Meds: Lantus 54 units at HS       Novolog 10 units Bfast / 30 units Lunch/ 30 units Dinner       Jardiance 10 mg QAM    Current Orders: Semglee 50 units at bedtime      Novolog Sensitive Correction Scale/ SSI (0-9 units) TID AC + HS     MD- Note pt takes fairly large doses of Novolog with meals at home.  CBG 340 at [facility] today.  Please consider starting Novolog Meal Coverage: Novolog 6 units TID with meals to start HOLD if pt NPO HOLD if pt eats <50% meals    --Will follow patient during hospitalization--  Ambrose Finland RN, MSN, CDCES Diabetes Coordinator Inpatient Glycemic Control Team Team Pager: [phone] (8a-5p)

## 2023-11-01 NOTE — Care Management Important Message (Signed)
Important Message  Patient Details  Name: Mike [facility] MRN: [id number] Date of Birth: 1940-12-30   Important Message Given:  N/A - LOS <3 / Initial given by admissions     Olegario Messier A Tanvi Gatling 11/01/2023, 7:55 AM

## 2023-11-01 NOTE — Progress Notes (Signed)
Pt c/o chest pain, 6/10,, states chest pressure. VSs , EKG completed as ordered. NSR w/ 1 degree HB. TYlendol PO given. NGT given will continue to follow. States decrease in chest pain. SRP, RN

## 2023-11-01 NOTE — Progress Notes (Signed)
PROGRESS NOTE    Mike Decker   YQM:[id number] DOB: [birth year]  DOA: 10/30/2023 Date of Service: 11/01/23 which is hospital day 2  PCP: [name], MD    HPI: Mike Decker is a 82 y.o. male  with a past medical history of CAD s/p PCI w/ DES RCA, prox LCx, jailed OM 2 but good flow, HFpEF (EF 55-60% 08/2022), mild to moderate AS (mean grad 19 mmHg), CKD 4, type 2 diabetes, carotid stenosis, COPD, OSA on CPAP, Barrett's esophagus, history of prostate cancer who presented to the ED on 10/30/2023 for chest pain. Patient reports that at 2:30 AM he woke up with chest pain. States he waited 10 minutes but the pain persisted so he took 3 nitroglycerin 5 minutes apart with no relief. He states that he has chronic chest pain but this episode was worse than usual. He denies any exertional symptoms with this episode today. He called EMS.   Of note, Left heart cath done in 12/2022 demonstrated patent prior stents and diffuse disease not significant enough to indicate stenting.    Hospital course / significant events:  11/20: admitted to hospitalist service w/ cardiology consult. No heparin or cardiac intervention. Meds adjusted. AKI monitoring.  11/21: Cr improved but not quite back to baseline. Pt feeling some abd distension/SOB  11/22: significant SOB and dizziness on ambulation, likely d/t medication dosage adjustment w/ beta blocker. Eval for O2 need   Consultants:  Cardiology  Procedures/Surgeries: None       ASSESSMENT & PLAN:   Chest pain Coronary artery disease s/p multiple prior stents Troponins mildly elevated 27 > 32 > 43 > 45.  EKG stable from prior, nonacute.  Cardiology following - pt is clear fir discharge from their perspective   metoprolol 100 mg twice daily increased to 150 mg bid and pt was dizzy, will reduce back down  imdur to 120 mg twice daily.  ranexa 1000 mg twice daily.  lipitor 80 mg daily plavix 75 mg daily.  No plan for further cardiac  diagnostics.    Chronic HFpEF Moderate aortic stenosis Appears euvolemic on exam. BNP 110.  Torsemide 40 mg daily    Chronic atrial fibrillation Patient with chronic AF not on anticoagulation due to bleeding issues. Rate controlled on tele.  See plan above for metoprolol.    AKI on CKD IV Patient with chronic kidney disease with baseline Cr 2.5-3.0, with Cr 4.03 on presentation, improved to 3.6 today Continue to monitor renal function closely.    obese based on BMI: Body mass index is 31.3 kg/m.  Underweight - under 18.5  normal weight - 18.5 to 24.9 overweight - 25 to 29.9 obese - 30 or more   DVT prophylaxis: heparin sq IV fluids: no continuous IV fluids  Nutrition: cardiac/carb diet  Central lines / invasive devices: none  Code Status: FULL CODE ACP documentation reviewed: 10/31/23 and has advanced directive on file in [facility]  TOC needs: none at this time  Barriers to dispo / significant pending items: AKI, if creatinine continuing to trend appropriately and ambulating well, anticipate d/c tomorrow              Subjective / Brief ROS:  Patient reports feeling significant SOB on minimal exertion  NO chest pain  Pain controlled.  Denies new weakness.  Tolerating diet.  Reports no concerns w/ urination/defecation.   Family Communication: none at this time     Objective Findings:  Vitals:   11/01/23 0725 11/01/23 0826 11/01/23  1207 11/01/23 1503  BP:  125/60 (!) 121/56 (!) 135/59  Pulse:  61 64 64  Resp:      Temp:  98.1 F (36.7 C) 97.8 F (36.6 C) 98.3 F (36.8 C)  TempSrc:    Oral  SpO2: 95% 93% 98% 100%  Weight:      Height:        Intake/Output Summary (Last 24 hours) at 11/01/2023 1507 Last data filed at 11/01/2023 1028 Gross per 24 hour  Intake 240 ml  Output 500 ml  Net -260 ml   Filed Weights   11/01/23 0200  Weight: 87.9 kg    Examination:  Physical Exam Constitutional:      General: He is not in acute distress.     Appearance: He is well-developed. He is obese.  Cardiovascular:     Rate and Rhythm: Normal rate and regular rhythm.     Heart sounds: Murmur heard.  Pulmonary:     Effort: Pulmonary effort is normal.     Breath sounds: Normal breath sounds.  Neurological:     General: No focal deficit present.     Mental Status: He is alert and oriented to person, place, and time.  Psychiatric:        Mood and Affect: Mood normal.        Behavior: Behavior normal.          Scheduled Medications:   arformoterol  15 mcg Nebulization BID   And   umeclidinium bromide  1 puff Inhalation Daily   atorvastatin  80 mg Oral Daily   calcitRIOL  0.25 mcg Oral Daily   citalopram  10 mg Oral Daily   clopidogrel  75 mg Oral Daily   heparin  5,000 Units Subcutaneous Q8H   insulin aspart  0-9 Units Subcutaneous TID AC & HS   insulin glargine-yfgn  50 Units Subcutaneous QHS   isosorbide mononitrate  120 mg Oral BID   [START ON 11/02/2023] metoprolol succinate  150 mg Oral Daily   montelukast  10 mg Oral QHS   pantoprazole  40 mg Oral BID AC   ranolazine  1,000 mg Oral Daily   sodium bicarbonate  650 mg Oral BID   torsemide  40 mg Oral Daily    Continuous Infusions:   PRN Medications:  acetaminophen, nitroGLYCERIN, ondansetron (ZOFRAN) IV  Antimicrobials from admission:  Anti-infectives (From admission, onward)    None           Data Reviewed:  I have personally reviewed the following...  CBC: Recent Labs  Lab 10/30/23 0426 10/31/23 0440  WBC 9.1 9.4  NEUTROABS 6.1  --   HGB 12.4* 11.7*  HCT 37.4* 35.3*  MCV 97.1 97.5  PLT 243 236   Basic Metabolic Panel: Recent Labs  Lab 10/30/23 0531 10/31/23 0440 11/01/23 0454  NA 132* 132* 134*  K 4.4 4.8 4.1  CL 98 97* 98  CO2 25 24 23   GLUCOSE 199* 233* 170*  BUN 77* 71* 80*  CREATININE 4.03* 3.57* 3.09*  CALCIUM 9.0 8.9 9.0  MG 2.6*  --   --    GFR: Estimated Creatinine Clearance: 18.8 mL/min (A) (by C-G formula based on  SCr of 3.09 mg/dL (H)). Liver Function Tests: Recent Labs  Lab 10/30/23 0531  AST 18  ALT 15  ALKPHOS 55  BILITOT 0.8  PROT 7.3  ALBUMIN 4.3   No results for input(s): "LIPASE", "AMYLASE" in the last 168 hours. No results for input(s): "AMMONIA" in the  last 168 hours. Coagulation Profile: No results for input(s): "INR", "PROTIME" in the last 168 hours. Cardiac Enzymes: No results for input(s): "CKTOTAL", "CKMB", "CKMBINDEX", "TROPONINI" in the last 168 hours. BNP (last 3 results) No results for input(s): "PROBNP" in the last 8760 hours. HbA1C: Recent Labs    10/31/23 0440  HGBA1C 7.1*   CBG: Recent Labs  Lab 10/30/23 2248 10/31/23 0806 10/31/23 1355 11/01/23 0828 11/01/23 1209  GLUCAP 238* 228* 294* 191* 340*   Lipid Profile: No results for input(s): "CHOL", "HDL", "LDLCALC", "TRIG", "CHOLHDL", "LDLDIRECT" in the last 72 hours. Thyroid Function Tests: No results for input(s): "TSH", "T4TOTAL", "FREET4", "T3FREE", "THYROIDAB" in the last 72 hours. Anemia Panel: No results for input(s): "VITAMINB12", "FOLATE", "FERRITIN", "TIBC", "IRON", "RETICCTPCT" in the last 72 hours. Most Recent Urinalysis On File:     Component Value Date/Time   COLORURINE YELLOW (A) 04/18/2023 0908   APPEARANCEUR CLEAR (A) 04/18/2023 0908   LABSPEC 1.008 04/18/2023 0908   PHURINE 5.0 04/18/2023 0908   GLUCOSEU >=500 (A) 04/18/2023 0908   HGBUR NEGATIVE 04/18/2023 0908   BILIRUBINUR NEGATIVE 04/18/2023 0908   BILIRUBINUR 1+ 11/01/2020 1512   KETONESUR NEGATIVE 04/18/2023 0908   PROTEINUR NEGATIVE 04/18/2023 0908   UROBILINOGEN 2.0 (A) 11/01/2020 1512   NITRITE NEGATIVE 04/18/2023 0908   LEUKOCYTESUR NEGATIVE 04/18/2023 0908   Sepsis Labs: @LABRCNTIP (procalcitonin:4,lacticidven:4) Microbiology: No results found for this or any previous visit (from the past 240 hour(s)).    Radiology Studies last 3 days: US RENAL  Result Date: 10/30/2023 CLINICAL DATA:  Acute kidney injury EXAM:  RENAL / URINARY TRACT ULTRASOUND COMPLETE COMPARISON:  None Available. FINDINGS: Right Kidney: Renal measurements: 10.7 x 5.2 x 4.7 cm = volume: 136 mL. Echogenic renal parenchyma. 8 x 8 x 8 mm exophytic upper pole cyst. No hydronephrosis. Left Kidney: Renal measurements: 10.1 x 4.9 x 4.8 cm = volume: 124 mL. Echogenic renal parenchyma. No mass or hydronephrosis visualized. Bladder: Appears normal for degree of bladder distention. Other: None. IMPRESSION: Echogenic renal parenchyma, suggesting medical renal disease. 8 mm exophytic right upper pole cyst, benign. No hydronephrosis. Electronically Signed   By: Charline Bills M.D.   On: 10/30/2023 16:49   DG Chest Portable 1 View  Result Date: 10/30/2023 CLINICAL DATA:  82 year old male with sudden onset chest pain, pressure. Similar to 2 previous NSTEMI. EXAM: PORTABLE CHEST 1 VIEW COMPARISON:  Chest radiographs 09/29/2023. FINDINGS: Portable AP upright view at 0449 hours. Stable lung volumes and mediastinal contours. Lower mediastinal lipomatosis demonstrated on CT earlier this year. Subpleural lung scarring and some emphysema also demonstrated on that exam. But Allowing for portable technique the lungs are clear. No pneumothorax or pleural effusion. Visualized tracheal air column is within normal limits. No acute osseous abnormality identified. IMPRESSION: No acute cardiopulmonary abnormality. Electronically Signed   By: Odessa Fleming M.D.   On: 10/30/2023 04:59          Sunnie Nielsen, DO [facility] 11/01/2023, 3:07 PM    Dictation software may have been used to generate the above note. Typos may occur and escape review in typed/dictated notes. Please contact Dr Lyn Hollingshead directly for clarity if needed.  Staff may message me via secure chat in Epic  but this may not receive an immediate response,  please page me for urgent matters!  If 7PM-7AM, please contact night coverage [email or web address]

## 2023-11-01 NOTE — TOC Progression Note (Signed)
Transition of Care [facility]) - Progression Note    Patient Details  Name: Sixto Heigel MRN: [id number] Date of Birth: 02/23/1941  Transition of [facility]) CM/SW Contact  Truddie Hidden, RN Phone Number: 11/01/2023, 11:01 AM  Clinical Narrative:    TOC continuing to follow patient's progress throughout discharge planning.        Expected Discharge Plan and Services                                               Social Determinants of Health (SDOH) Interventions SDOH Screenings   Food Insecurity: No Food Insecurity (10/30/2023)  Housing: Low Risk  (10/30/2023)  Transportation Needs: No Transportation Needs (10/30/2023)  Utilities: Not At Risk (10/30/2023)  Alcohol Screen: Low Risk  (09/04/2018)  Depression (PHQ2-9): Medium Risk (08/21/2023)  Financial Resource Strain: Low Risk  (06/21/2023)   Received from [facility]  Physical Activity: Insufficiently Active (09/15/2021)  Social Connections: Moderately Isolated (09/15/2021)  Stress: No Stress Concern Present (09/15/2021)  Tobacco Use: Medium Risk (10/31/2023)    Readmission Risk Interventions    10/30/2023   11:28 AM  Readmission Risk Prevention Plan  Transportation Screening Complete  PCP or Specialist Appt within 5-7 Days Complete  Home Care Screening Complete  Medication Review (RN CM) Complete

## 2023-11-02 DIAGNOSIS — R079 Chest pain, unspecified: Secondary | ICD-10-CM

## 2023-11-02 LAB — BASIC METABOLIC PANEL
Anion gap: 11 (ref 5–15)
BUN: 77 mg/dL — ABNORMAL HIGH (ref 8–23)
CO2: 22 mmol/L (ref 22–32)
Calcium: 8.9 mg/dL (ref 8.9–10.3)
Chloride: 98 mmol/L (ref 98–111)
Creatinine, Ser: 2.87 mg/dL — ABNORMAL HIGH (ref 0.61–1.24)
GFR, Estimated: 21 mL/min — ABNORMAL LOW (ref >60)
Glucose, Bld: 107 mg/dL — ABNORMAL HIGH (ref 70–99)
Potassium: 3.9 mmol/L (ref 3.5–5.1)
Sodium: 131 mmol/L — ABNORMAL LOW (ref 135–145)

## 2023-11-02 LAB — GLUCOSE, CAPILLARY
Glucose-Capillary: 119 mg/dL — ABNORMAL HIGH (ref 70–99)
Glucose-Capillary: 249 mg/dL — ABNORMAL HIGH (ref 70–99)
Glucose-Capillary: 234 mg/dL — ABNORMAL HIGH (ref 70–99)
Glucose-Capillary: 302 mg/dL — ABNORMAL HIGH (ref 70–99)

## 2023-11-02 MED ORDER — INSULIN ASPART 100 UNIT/ML IJ SOLN
3.0000 [IU] | Freq: Three times a day (TID) | INTRAMUSCULAR | Status: DC
Start: 2023-11-03 — End: 2023-11-03
  Administered 2023-11-03 (×3): 3 [IU] via SUBCUTANEOUS
  Filled 2023-11-02 (×3): qty 1

## 2023-11-02 MED ORDER — SENNOSIDES-DOCUSATE SODIUM 8.6-50 MG PO TABS
2.0000 | ORAL_TABLET | Freq: Once | ORAL | Status: AC
Start: 2023-11-02 — End: 2023-11-02
  Administered 2023-11-02: 2 via ORAL
  Filled 2023-11-02: qty 2

## 2023-11-02 MED ORDER — ACETAMINOPHEN 325 MG PO TABS
650.0000 mg | ORAL_TABLET | ORAL | Status: DC | PRN
  Administered 2023-11-02 – 2023-11-03 (×4): 650 mg via ORAL
  Filled 2023-11-02 (×3): qty 2

## 2023-11-02 MED ORDER — METOPROLOL SUCCINATE ER 50 MG PO TB24
50.0000 mg | ORAL_TABLET | Freq: Every day | ORAL | Status: DC
  Administered 2023-11-02 – 2023-11-03 (×2): 50 mg via ORAL
  Filled 2023-11-02 (×2): qty 1

## 2023-11-02 MED ORDER — TORSEMIDE 20 MG PO TABS
40.0000 mg | ORAL_TABLET | Freq: Every day | ORAL | Status: DC
  Administered 2023-11-02 – 2023-11-03 (×2): 40 mg via ORAL
  Filled 2023-11-02 (×2): qty 2

## 2023-11-02 NOTE — Evaluation (Signed)
Physical Therapy Evaluation Patient Details Name: Mike Decker MRN: [id number] DOB: [birth year] Today's Date: 11/02/2023  History of Present Illness  Pt admitted for chest pain with extensive cardiac medical history. PMH includes, COPD, prostate cancer, DM, CKD, and CVA. Previously going to cardiac rehab at [facility], missed last 4 sessions.  Clinical Impression  Pt is a pleasant 82 year old male who was admitted for chest pain. Upon arrival, pt complains of current chest pain.This Archivist and discussed. Cleared to participate as pt has been medicated with nitroglycerin and reports this is an ongoing issue. Advised to continue PT consult. Per patient, he was agreeable to consult. Pt performs bed mobility with mod I, transfers with supervision, and ambulation with supervision and Rw. He reports chest pain at 5/10; MD and RN aware of chest pain reports. Pt reports no worsening with exertion. Pt demonstrates deficits with strength/mobility/balance. No dizziness with exertion. Would benefit from skilled PT to address above deficits and promote optimal return to PLOF. Pt will continue to receive skilled PT services while admitted and will defer to TOC/care team for updates regarding disposition planning. Pt is hopeful to return back to [facility] cardiac rehab once cleared medically.       If plan is discharge home, recommend the following: A little help with walking and/or transfers;Help with stairs or ramp for entrance   Can travel by private vehicle        Equipment Recommendations None recommended by PT  Recommendations for Other Services       Functional Status Assessment Patient has had a recent decline in their functional status and demonstrates the ability to make significant improvements in function in a reasonable and predictable amount of time.     Precautions / Restrictions Precautions Precautions: Fall Restrictions Weight Bearing Restrictions: No      Mobility  Bed  Mobility Overal bed mobility: Modified Independent             General bed mobility comments: safe technique with ease of mobility. NO dizziness with change of position    Transfers Overall transfer level: Needs assistance Equipment used: Rolling walker (2 wheels) Transfers: Sit to/from Stand Sit to Stand: Supervision           General transfer comment: safe technique with use of RW. Upright posture noted    Ambulation/Gait Ambulation/Gait assistance: Supervision Gait Distance (Feet): 100 Feet Assistive device: Rolling walker (2 wheels) Gait Pattern/deviations: Step-through pattern       General Gait Details: slow and cautious gait. No worsening symptoms, no dizziness. Does complain of fatigue. PT limited distance secondary to chest pain, RN aware  Stairs            Wheelchair Mobility     Tilt Bed    Modified Rankin (Stroke Patients Only)       Balance Overall balance assessment: Needs assistance Sitting-balance support: Feet supported, No upper extremity supported Sitting balance-Leahy Scale: Good     Standing balance support: Bilateral upper extremity supported Standing balance-Leahy Scale: Good                               Pertinent Vitals/Pain Pain Assessment Pain Assessment: 0-10 Pain Score: 5  Pain Location: chest pain- RN aware Pain Descriptors / Indicators: Heaviness Pain Intervention(s): Limited activity within patient's tolerance, Premedicated before session    Home Living Family/patient expects to be discharged to:: Private residence Living Arrangements: Spouse/significant other;Children Available Help  at Discharge: Family Type of Home: House Home Access: Ramped entrance       Home Layout: One level Home Equipment: Agricultural consultant (2 wheels);Cane - single point;Shower seat - built in      Prior Function Prior Level of Function : Independent/Modified Independent             Mobility Comments: Uses SPC in  the community, no AD at home, no falls hx ADLs Comments: IND other than driving     Extremity/Trunk Assessment   Upper Extremity Assessment Upper Extremity Assessment: Overall WFL for tasks assessed    Lower Extremity Assessment Lower Extremity Assessment: Generalized weakness       Communication   Communication Communication: No apparent difficulties  Cognition Arousal: Alert Behavior During Therapy: WFL for tasks assessed/performed Overall Cognitive Status: Within Functional Limits for tasks assessed                                 General Comments: very pleasant and agreeable to session        General Comments      Exercises Other Exercises Other Exercises: discussed energy pacing, use of AD and changing position slowly to help with dizziness   Assessment/Plan    PT Assessment Patient needs continued PT services  PT Problem List Decreased strength;Decreased balance;Decreased activity tolerance;Decreased mobility       PT Treatment Interventions DME instruction;Gait training;Therapeutic exercise    PT Goals (Current goals can be found in the Care Plan section)  Acute Rehab PT Goals Patient Stated Goal: to go home PT Goal Formulation: With patient Time For Goal Achievement: 11/16/23 Potential to Achieve Goals: Good    Frequency Min 1X/week     Co-evaluation               AM-PAC PT "6 Clicks" Mobility  Outcome Measure Help needed turning from your back to your side while in a flat bed without using bedrails?: None Help needed moving from lying on your back to sitting on the side of a flat bed without using bedrails?: None Help needed moving to and from a bed to a chair (including a wheelchair)?: A Little Help needed standing up from a chair using your arms (e.g., wheelchair or bedside chair)?: A Little Help needed to walk in hospital room?: A Little Help needed climbing 3-5 steps with a railing? : A Lot 6 Click Score: 19    End of  Session Equipment Utilized During Treatment: Gait belt Activity Tolerance: Patient tolerated treatment well Patient left: in chair;with chair alarm set Nurse Communication: Mobility status PT Visit Diagnosis: Unsteadiness on feet (R26.81);Muscle weakness (generalized) (M62.81)    Time: 1610-9604 PT Time Calculation (min) (ACUTE ONLY): 33 min   Charges:   PT Evaluation $PT Eval Low Complexity: 1 Low PT Treatments $Gait Training: 8-22 mins PT General Charges $$ ACUTE PT VISIT: 1 Visit         Elizabeth Palau, PT, DPT, GCS [phone]   Mar Walmer 11/02/2023, 1:32 PM

## 2023-11-02 NOTE — Progress Notes (Signed)
PROGRESS NOTE    Mike Decker   HYW:[id number] DOB: 1941-03-26  DOA: 10/30/2023 Date of Service: 11/02/23 which is hospital day 3  PCP: Mike Downs, MD    HPI: Mike Decker is a 82 y.o. male  with a past medical history of CAD s/p PCI w/ DES RCA, prox LCx, jailed OM 2 but good flow, HFpEF (EF 55-60% 08/2022), mild to moderate AS (mean grad 19 mmHg), CKD 4, type 2 diabetes, carotid stenosis, COPD, OSA on CPAP, Barrett's esophagus, history of prostate cancer who presented to the ED on 10/30/2023 for chest pain. Patient reports that at 2:30 AM he woke up with chest pain. States he waited 10 minutes but the pain persisted so he took 3 nitroglycerin 5 minutes apart with no relief. He states that he has chronic chest pain but this episode was worse than usual. He denies any exertional symptoms with this episode today. He called EMS.   Of note, Left heart cath done in 12/2022 demonstrated patent prior stents and diffuse disease not significant enough to indicate stenting.    Hospital course / significant events:  11/20: admitted to hospitalist service w/ cardiology consult. No heparin or cardiac intervention. Meds adjusted. AKI monitoring.  11/21: Cr improved but not quite back to baseline. Pt feeling some abd distension/SOB  11/22-11/23: significant SOB and dizziness on ambulation, likely d/t medication dosage adjustment w/ beta blocker. Eval for O2 need. PT eval. He is very reluctant to go home given recurrent chest pain and dizziness. VS have been stable. Cardiology following    Consultants:  Cardiology  Procedures/Surgeries: None       ASSESSMENT & PLAN:   Chest pain Coronary artery disease s/p multiple prior stents Troponins mildly elevated 27 > 32 > 43 > 45.  EKG stable from prior, nonacute.  Cardiology following - pt is clear for discharge from their perspective   metoprolol 100 mg twice daily increased to 150 mg bid and pt was dizzy, will reduce down to 50   imdur to 120 mg twice daily.  ranexa 1000 mg twice daily.  lipitor 80 mg daily plavix 75 mg daily.  No plan for further cardiac diagnostics.  Per cardiology, monitor, possible d/c today/tomorrow Long discussion today w/ patient re intermittent angina and lightheadedness may be baseline given cardiac conditions and medications. Likely would benefit from outpatient palliative care if symptoms persist    Hypotension, postural dizziness Orthoststic VS no concerns  Reduced metoprolol  PT/OT to work with him   Chronic HFpEF Moderate aortic stenosis Appears euvolemic  Torsemide 40 mg daily    Chronic atrial fibrillation Patient with chronic AF not on anticoagulation due to bleeding issues. Rate controlled on tele.  See plan above for metoprolol.    AKI on CKD IV Patient with chronic kidney disease with baseline Cr 2.5-3.0, with Cr 4.03 on presentation, improved to 2.87 today Continue to monitor renal function closely.    obese based on BMI: Body mass index is 31.3 kg/m.  Underweight - under 18.5  normal weight - 18.5 to 24.9 overweight - 25 to 29.9 obese - 30 or more   DVT prophylaxis: heparin sq IV fluids: no continuous IV fluids  Nutrition: cardiac/carb diet  Central lines / invasive devices: none  Code Status: FULL CODE ACP documentation reviewed: 10/31/23 and has advanced directive on file in [facility]  TOC needs: none at this time  Barriers to dispo / significant pending items: AKI, if creatinine continuing to trend appropriately and ambulating well,  anticipate d/c tomorrow              Subjective / Brief ROS:  Patient reports feeling significant SOB on minimal exertion  NO chest pain now  Pain controlled.  Denies new weakness.  Tolerating diet.  Reports no concerns w/ urination/defecation.  He is very nervous to go home. Lives with wife but she is not much help if he were to fall or have a problem. He is worried about falling, about SOB and CP  Family  Communication: none at this time     Objective Findings:  Vitals:   11/02/23 0518 11/02/23 0725 11/02/23 1031 11/02/23 1108  BP: (!) 117/44 (!) 123/53 (!) 137/43 135/77  Pulse: (!) 57 (!) 56  74  Resp: 18 16  18   Temp: 98.3 F (36.8 C) 98.1 F (36.7 C)  98.2 F (36.8 C)  TempSrc:  Oral    SpO2: 92% 94%  95%  Weight:      Height:        Intake/Output Summary (Last 24 hours) at 11/02/2023 1523 Last data filed at 11/02/2023 0900 Gross per 24 hour  Intake 240 ml  Output --  Net 240 ml   Filed Weights   11/01/23 0200  Weight: 87.9 kg    Examination:  Physical Exam Constitutional:      General: He is not in acute distress.    Appearance: He is well-developed. He is obese.  Cardiovascular:     Rate and Rhythm: Normal rate and regular rhythm.     Heart sounds: Murmur heard.  Pulmonary:     Effort: Pulmonary effort is normal.     Breath sounds: Normal breath sounds.  Neurological:     General: No focal deficit present.     Mental Status: He is alert and oriented to person, place, and time.  Psychiatric:        Mood and Affect: Mood normal.        Behavior: Behavior normal.          Scheduled Medications:   arformoterol  15 mcg Nebulization BID   And   umeclidinium bromide  1 puff Inhalation Daily   atorvastatin  80 mg Oral Daily   calcitRIOL  0.25 mcg Oral Daily   citalopram  10 mg Oral Daily   clopidogrel  75 mg Oral Daily   heparin  5,000 Units Subcutaneous Q8H   insulin aspart  0-9 Units Subcutaneous TID AC & HS   insulin glargine-yfgn  50 Units Subcutaneous QHS   isosorbide mononitrate  120 mg Oral BID   metoprolol succinate  50 mg Oral Daily   montelukast  10 mg Oral QHS   pantoprazole  40 mg Oral BID AC   polyethylene glycol  17 g Oral Daily   ranolazine  1,000 mg Oral Daily   sodium bicarbonate  650 mg Oral BID   torsemide  40 mg Oral Daily    Continuous Infusions:   PRN Medications:  acetaminophen, bisacodyl, nitroGLYCERIN, ondansetron  (ZOFRAN) IV  Antimicrobials from admission:  Anti-infectives (From admission, onward)    None           Data Reviewed:  I have personally reviewed the following...  CBC: Recent Labs  Lab 10/30/23 0426 10/31/23 0440  WBC 9.1 9.4  NEUTROABS 6.1  --   HGB 12.4* 11.7*  HCT 37.4* 35.3*  MCV 97.1 97.5  PLT 243 236   Basic Metabolic Panel: Recent Labs  Lab 10/30/23 0531 10/31/23 0440 11/01/23  0454 11/02/23 0427  NA 132* 132* 134* 131*  K 4.4 4.8 4.1 3.9  CL 98 97* 98 98  CO2 25 24 23 22   GLUCOSE 199* 233* 170* 107*  BUN 77* 71* 80* 77*  CREATININE 4.03* 3.57* 3.09* 2.87*  CALCIUM 9.0 8.9 9.0 8.9  MG 2.6*  --   --   --    GFR: Estimated Creatinine Clearance: 20.2 mL/min (A) (by C-G formula based on SCr of 2.87 mg/dL (H)). Liver Function Tests: Recent Labs  Lab 10/30/23 0531  AST 18  ALT 15  ALKPHOS 55  BILITOT 0.8  PROT 7.3  ALBUMIN 4.3   No results for input(s): "LIPASE", "AMYLASE" in the last 168 hours. No results for input(s): "AMMONIA" in the last 168 hours. Coagulation Profile: No results for input(s): "INR", "PROTIME" in the last 168 hours. Cardiac Enzymes: No results for input(s): "CKTOTAL", "CKMB", "CKMBINDEX", "TROPONINI" in the last 168 hours. BNP (last 3 results) No results for input(s): "PROBNP" in the last 8760 hours. HbA1C: Recent Labs    10/31/23 0440  HGBA1C 7.1*   CBG: Recent Labs  Lab 11/01/23 1209 11/01/23 1636 11/01/23 2045 11/02/23 0726 11/02/23 1130  GLUCAP 340* 169* 213* 119* 249*   Lipid Profile: No results for input(s): "CHOL", "HDL", "LDLCALC", "TRIG", "CHOLHDL", "LDLDIRECT" in the last 72 hours. Thyroid Function Tests: No results for input(s): "TSH", "T4TOTAL", "FREET4", "T3FREE", "THYROIDAB" in the last 72 hours. Anemia Panel: No results for input(s): "VITAMINB12", "FOLATE", "FERRITIN", "TIBC", "IRON", "RETICCTPCT" in the last 72 hours. Most Recent Urinalysis On File:     Component Value Date/Time    COLORURINE YELLOW (A) 04/18/2023 0908   APPEARANCEUR CLEAR (A) 04/18/2023 0908   LABSPEC 1.008 04/18/2023 0908   PHURINE 5.0 04/18/2023 0908   GLUCOSEU >=500 (A) 04/18/2023 0908   HGBUR NEGATIVE 04/18/2023 0908   BILIRUBINUR NEGATIVE 04/18/2023 0908   BILIRUBINUR 1+ 11/01/2020 1512   KETONESUR NEGATIVE 04/18/2023 0908   PROTEINUR NEGATIVE 04/18/2023 0908   UROBILINOGEN 2.0 (A) 11/01/2020 1512   NITRITE NEGATIVE 04/18/2023 0908   LEUKOCYTESUR NEGATIVE 04/18/2023 0908   Sepsis Labs: @LABRCNTIP (procalcitonin:4,lacticidven:4) Microbiology: No results found for this or any previous visit (from the past 240 hour(s)).    Radiology Studies last 3 days: US RENAL  Result Date: 10/30/2023 CLINICAL DATA:  Acute kidney injury EXAM: RENAL / URINARY TRACT ULTRASOUND COMPLETE COMPARISON:  None Available. FINDINGS: Right Kidney: Renal measurements: 10.7 x 5.2 x 4.7 cm = volume: 136 mL. Echogenic renal parenchyma. 8 x 8 x 8 mm exophytic upper pole cyst. No hydronephrosis. Left Kidney: Renal measurements: 10.1 x 4.9 x 4.8 cm = volume: 124 mL. Echogenic renal parenchyma. No mass or hydronephrosis visualized. Bladder: Appears normal for degree of bladder distention. Other: None. IMPRESSION: Echogenic renal parenchyma, suggesting medical renal disease. 8 mm exophytic right upper pole cyst, benign. No hydronephrosis. Electronically Signed   By: Charline Bills M.D.   On: 10/30/2023 16:49   DG Chest Portable 1 View  Result Date: 10/30/2023 CLINICAL DATA:  82 year old male with sudden onset chest pain, pressure. Similar to 2 previous NSTEMI. EXAM: PORTABLE CHEST 1 VIEW COMPARISON:  Chest radiographs 09/29/2023. FINDINGS: Portable AP upright view at 0449 hours. Stable lung volumes and mediastinal contours. Lower mediastinal lipomatosis demonstrated on CT earlier this year. Subpleural lung scarring and some emphysema also demonstrated on that exam. But Allowing for portable technique the lungs are clear. No  pneumothorax or pleural effusion. Visualized tracheal air column is within normal limits. No acute osseous abnormality  identified. IMPRESSION: No acute cardiopulmonary abnormality. Electronically Signed   By: Odessa Fleming M.D.   On: 10/30/2023 04:59          Sunnie Nielsen, DO [facility] 11/02/2023, 3:23 PM    Dictation software may have been used to generate the above note. Typos may occur and escape review in typed/dictated notes. Please contact Dr Lyn Hollingshead directly for clarity if needed.  Staff may message me via secure chat in Epic  but this may not receive an immediate response,  please page me for urgent matters!  If 7PM-7AM, please contact night coverage [email or web address]

## 2023-11-02 NOTE — Evaluation (Signed)
Occupational Therapy Evaluation Patient Details Name: Mike Decker MRN: [id number] DOB: [birth year] Today's Date: 11/02/2023   History of Present Illness Pt admitted for chest pain with extensive cardiac medical history. PMH includes, COPD, prostate cancer, DM, CKD, and CVA. Previously going to cardiac rehab at [facility], missed last 4 sessions.   Clinical Impression   Mr. Dryden presents with generalized weakness and 6/10 chest pain. Despite his pain, pt is able to perform functional mobility tasks with Mod I. He ambulated INDly in the room with no LOB and could transfer sit<>stand from surfaces at multiple heights. Pt reports that, other than driving, he is IND in all B/IADL at home and that he feels he is back to that baseline level at present. No additional OT services are required. Will sign off.    If plan is discharge home, recommend the following: Assist for transportation    Functional Status Assessment  Patient has had a recent decline in their functional status and demonstrates the ability to make significant improvements in function in a reasonable and predictable amount of time.  Equipment Recommendations  None recommended by OT    Recommendations for Other Services       Precautions / Restrictions Precautions Precautions: Fall Restrictions Weight Bearing Restrictions: No      Mobility Bed Mobility Overal bed mobility: Modified Independent                  Transfers Overall transfer level: Needs assistance Equipment used: None Transfers: Sit to/from Stand Sit to Stand: Supervision                  Balance Overall balance assessment: Needs assistance Sitting-balance support: Feet supported, No upper extremity supported Sitting balance-Leahy Scale: Good     Standing balance support: No upper extremity supported Standing balance-Leahy Scale: Good                             ADL either performed or assessed with clinical  judgement   ADL Overall ADL's : Modified independent;At baseline                                             Vision         Perception         Praxis         Pertinent Vitals/Pain Pain Assessment Pain Assessment: 0-10 Pain Score: 6  Pain Location: chest pain- RN aware Pain Descriptors / Indicators: Heaviness Pain Intervention(s): Limited activity within patient's tolerance, Repositioned     Extremity/Trunk Assessment Upper Extremity Assessment Upper Extremity Assessment: Overall WFL for tasks assessed   Lower Extremity Assessment Lower Extremity Assessment: Generalized weakness       Communication Communication Communication: No apparent difficulties   Cognition Arousal: Alert Behavior During Therapy: WFL for tasks assessed/performed Overall Cognitive Status: Within Functional Limits for tasks assessed                                 General Comments: very pleasant and agreeable to session     General Comments       Exercises     Shoulder Instructions      Home Living Family/patient expects to be discharged to:: Private residence Living Arrangements: Spouse/significant other;Children Available  Help at Discharge: Family Type of Home: House Home Access: Ramped entrance     Home Layout: One level     Bathroom Shower/Tub: Walk-in shower;Tub/shower unit   Bathroom Toilet: Standard     Home Equipment: Agricultural consultant (2 wheels);Cane - single point;Shower seat - built in          Prior Functioning/Environment Prior Level of Function : Independent/Modified Independent             Mobility Comments: Uses SPC in the community, no AD at home, no falls hx ADLs Comments: IND other than driving        OT Problem List: Decreased activity tolerance;Decreased strength      OT Treatment/Interventions:      OT Goals(Current goals can be found in the care plan section) Acute Rehab OT Goals Patient Stated Goal:  to return home and get back to cardio rehab program OT Goal Formulation: With patient Time For Goal Achievement: 11/16/23 Potential to Achieve Goals: Good  OT Frequency:      Co-evaluation              AM-PAC OT "6 Clicks" Daily Activity     Outcome Measure Help from another person eating meals?: None Help from another person taking care of personal grooming?: None Help from another person toileting, which includes using toliet, bedpan, or urinal?: None Help from another person bathing (including washing, rinsing, drying)?: None Help from another person to put on and taking off regular upper body clothing?: None Help from another person to put on and taking off regular lower body clothing?: A Little 6 Click Score: 23   End of Session    Activity Tolerance: Patient tolerated treatment well Patient left: in chair;with call bell/phone within reach;with chair alarm set  OT Visit Diagnosis: Muscle weakness (generalized) (M62.81);Pain                Time: 9811-9147 OT Time Calculation (min): 10 min Charges:  OT General Charges $OT Visit: 1 Visit OT Evaluation $OT Eval Low Complexity: 1 Low Latina Craver, PhD, MS, OTR/L 11/02/23, 12:11 PM

## 2023-11-02 NOTE — Progress Notes (Signed)
[facility] Cardiology  SUBJECTIVE: Patient laying in bed, denies chest pain   Vitals:   11/01/23 2140 11/01/23 2329 11/02/23 0518 11/02/23 0725  BP:  (!) 107/46 (!) 117/44 (!) 123/53  Pulse:  (!) 57 (!) 57 (!) 56  Resp:  18 18 16   Temp:  98 F (36.7 C) 98.3 F (36.8 C) 98.1 F (36.7 C)  TempSrc:    Oral  SpO2: 97% 97% 92% 94%  Weight:      Height:         Intake/Output Summary (Last 24 hours) at 11/02/2023 0930 Last data filed at 11/01/2023 1028 Gross per 24 hour  Intake 240 ml  Output --  Net 240 ml      PHYSICAL EXAM  General: Well developed, well nourished, in no acute distress HEENT:  Normocephalic and atramatic Neck:  No JVD.  Lungs: Clear bilaterally to auscultation and percussion. Heart: HRRR . Normal S1 and S2 without gallops or murmurs.  Abdomen: Bowel sounds are positive, abdomen soft and non-tender  Msk:  Back normal, normal gait. Normal strength and tone for age. Extremities: No clubbing, cyanosis or edema.   Neuro: Alert and oriented X 3. Psych:  Good affect, responds appropriately   LABS: Basic Metabolic Panel: Recent Labs    11/01/23 0454 11/02/23 0427  NA 134* 131*  K 4.1 3.9  CL 98 98  CO2 23 22  GLUCOSE 170* 107*  BUN 80* 77*  CREATININE 3.09* 2.87*  CALCIUM 9.0 8.9   Liver Function Tests: No results for input(s): "AST", "ALT", "ALKPHOS", "BILITOT", "PROT", "ALBUMIN" in the last 72 hours. No results for input(s): "LIPASE", "AMYLASE" in the last 72 hours. CBC: Recent Labs    10/31/23 0440  WBC 9.4  HGB 11.7*  HCT 35.3*  MCV 97.5  PLT 236   Cardiac Enzymes: No results for input(s): "CKTOTAL", "CKMB", "CKMBINDEX", "TROPONINI" in the last 72 hours. BNP: Invalid input(s): "POCBNP" D-Dimer: No results for input(s): "DDIMER" in the last 72 hours. Hemoglobin A1C: Recent Labs    10/31/23 0440  HGBA1C 7.1*   Fasting Lipid Panel: No results for input(s): "CHOL", "HDL", "LDLCALC", "TRIG", "CHOLHDL", "LDLDIRECT" in the last 72  hours. Thyroid Function Tests: No results for input(s): "TSH", "T4TOTAL", "T3FREE", "THYROIDAB" in the last 72 hours.  Invalid input(s): "FREET3" Anemia Panel: No results for input(s): "VITAMINB12", "FOLATE", "FERRITIN", "TIBC", "IRON", "RETICCTPCT" in the last 72 hours.  No results found.   Echo LVEF 55-60%, moderate aortic stenosis (1.24 cm, 2.14 m/s, 9.4 mmHg, 22 mmHg) 04/18/2023  TELEMETRY: Sinus rhythm:  ASSESSMENT AND PLAN:  Principal Problem:   Chest pain Active Problems:   Hyperlipidemia   NSTEMI (non-ST elevated myocardial infarction) (HCC)   History of stroke   Anemia of chronic renal failure, stage 4 (severe) (HCC)   Chronic obstructive pulmonary disease (HCC)   Gout   OSA (obstructive sleep apnea)   (HFpEF) heart failure with preserved ejection fraction (HCC)   Type II diabetes mellitus with renal manifestations (HCC)   Acute renal failure superimposed on stage 4 chronic kidney disease (HCC)   Atrial fibrillation, chronic (HCC)    1.  Coronary artery disease, history of DES proximal RCA, proximal left circumflex, with jailed OM 2, borderline elevated troponin (27, 32, 43, 45), medically stable on crease dose of metoprolol succinate, isosorbide mononitrate 20 mg twice daily, Ranexa 1000 mg twice daily 2.  Chronic HFpEF, LVEF 55-60% 04/18/2023, appears euvolemic, on torsemide 40 mg daily 3.  Moderate aortic stenosis, stable 4.  Paroxysmal  atrial fibrillation, not on anticoagulation due to history of bleeding 5.  AKI on CKD 4, creatinine 2.5-3.0, creatinine of 4.03 on admission, improved to 3.09  Recommendations  1.  Continue current therapy 2.  Continue current antianginals (isosorbide mononitrate, metoprolol succinate, Ranexa) 3.  Continue torsemide 40 mg daily 4.  Monitor renal status 5.  For chronic anticoagulation at this time 6.  Consider discharge home later today or in a.m. 7.  Follow-up Dr. Juliann Pares in 1 to 2 weeks   Marcina Millard, MD, PhD,  [facility] 11/02/2023 9:30 AM

## 2023-11-02 NOTE — Plan of Care (Signed)
Problem: Education: Goal: Understanding of cardiac disease, CV risk reduction, and recovery process will improve Outcome: Progressing Goal: Individualized Educational Video(s) Outcome: Progressing

## 2023-11-02 NOTE — Plan of Care (Signed)
  Problem: Education: Goal: Understanding of cardiac disease, CV risk reduction, and recovery process will improve Outcome: Progressing   Problem: Activity: Goal: Ability to tolerate increased activity will improve Outcome: Progressing   Problem: Cardiac: Goal: Ability to achieve and maintain adequate cardiovascular perfusion will improve Outcome: Progressing   Problem: Health Behavior/Discharge Planning: Goal: Ability to safely manage health-related needs after discharge will improve Outcome: Progressing   Problem: Education: Goal: Ability to describe self-care measures that may prevent or decrease complications (Diabetes Survival Skills Education) will improve Outcome: Progressing   Problem: Coping: Goal: Ability to adjust to condition or change in health will improve Outcome: Progressing

## 2023-11-03 DIAGNOSIS — R079 Chest pain, unspecified: Secondary | ICD-10-CM | POA: Diagnosis not present

## 2023-11-03 LAB — GLUCOSE, CAPILLARY
Glucose-Capillary: 225 mg/dL — ABNORMAL HIGH (ref 70–99)
Glucose-Capillary: 206 mg/dL — ABNORMAL HIGH (ref 70–99)
Glucose-Capillary: 160 mg/dL — ABNORMAL HIGH (ref 70–99)

## 2023-11-03 LAB — BASIC METABOLIC PANEL
Anion gap: 15 (ref 5–15)
BUN: 80 mg/dL — ABNORMAL HIGH (ref 8–23)
CO2: 22 mmol/L (ref 22–32)
Calcium: 8.9 mg/dL (ref 8.9–10.3)
Chloride: 93 mmol/L — ABNORMAL LOW (ref 98–111)
Creatinine, Ser: 3.2 mg/dL — ABNORMAL HIGH (ref 0.61–1.24)
GFR, Estimated: 18 mL/min — ABNORMAL LOW (ref >60)
Glucose, Bld: 188 mg/dL — ABNORMAL HIGH (ref 70–99)
Potassium: 4.4 mmol/L (ref 3.5–5.1)
Sodium: 130 mmol/L — ABNORMAL LOW (ref 135–145)

## 2023-11-03 MED ORDER — RANOLAZINE ER 1000 MG PO TB12: 1000.0000 mg | ORAL_TABLET | Freq: Every day | ORAL | 0 refills | Status: DC

## 2023-11-03 MED ORDER — TORSEMIDE 20 MG PO TABS: 20.0000 mg | ORAL_TABLET | Freq: Every day | ORAL | 0 refills | Status: DC

## 2023-11-03 MED ORDER — ISOSORBIDE MONONITRATE ER 120 MG PO TB24: 120.0000 mg | ORAL_TABLET | Freq: Two times a day (BID) | ORAL | 0 refills | Status: DC

## 2023-11-03 NOTE — Progress Notes (Signed)
[facility] Cardiology    SUBJECTIVE: Patient seems to be doing somewhat better had an episode of chest pain after ambulating in the halls pain was a 5 out of 10 he is having on and off recurrently midsternal did not take any nitroglycerin.  No significant shortness of breath no block as well as syncope no lightheaded or dizziness   Vitals:   11/03/23 0500 11/03/23 0742 11/03/23 0814 11/03/23 1135  BP:  134/63  (!) 120/49  Pulse:  85  72  Resp:  18  18  Temp:  98.6 F (37 C)  98.8 F (37.1 C)  TempSrc:      SpO2:  99% 98% 98%  Weight: 86.1 kg     Height:         Intake/Output Summary (Last 24 hours) at 11/03/2023 1415 Last data filed at 11/03/2023 0300 Gross per 24 hour  Intake 720 ml  Output 180 ml  Net 540 ml      PHYSICAL EXAM  General: Well developed, well nourished, in no acute distress HEENT:  Normocephalic and atramatic Neck:  No JVD.  Lungs: Clear bilaterally to auscultation and percussion. Heart: HRRR . Normal S1 and S2 without gallops or murmurs.  Abdomen: Bowel sounds are positive, abdomen soft and non-tender  Msk:  Back normal, normal gait. Normal strength and tone for age. Extremities: No clubbing, cyanosis or edema.   Neuro: Alert and oriented X 3. Psych:  Good affect, responds appropriately   LABS: Basic Metabolic Panel: Recent Labs    11/02/23 0427 11/03/23 0337  NA 131* 130*  K 3.9 4.4  CL 98 93*  CO2 22 22  GLUCOSE 107* 188*  BUN 77* 80*  CREATININE 2.87* 3.20*  CALCIUM 8.9 8.9   Liver Function Tests: No results for input(s): "AST", "ALT", "ALKPHOS", "BILITOT", "PROT", "ALBUMIN" in the last 72 hours. No results for input(s): "LIPASE", "AMYLASE" in the last 72 hours. CBC: No results for input(s): "WBC", "NEUTROABS", "HGB", "HCT", "MCV", "PLT" in the last 72 hours. Cardiac Enzymes: No results for input(s): "CKTOTAL", "CKMB", "CKMBINDEX", "TROPONINI" in the last 72 hours. BNP: Invalid input(s): "POCBNP" D-Dimer: No results for input(s):  "DDIMER" in the last 72 hours. Hemoglobin A1C: No results for input(s): "HGBA1C" in the last 72 hours. Fasting Lipid Panel: No results for input(s): "CHOL", "HDL", "LDLCALC", "TRIG", "CHOLHDL", "LDLDIRECT" in the last 72 hours. Thyroid Function Tests: No results for input(s): "TSH", "T4TOTAL", "T3FREE", "THYROIDAB" in the last 72 hours.  Invalid input(s): "FREET3" Anemia Panel: No results for input(s): "VITAMINB12", "FOLATE", "FERRITIN", "TIBC", "IRON", "RETICCTPCT" in the last 72 hours.  No results found.   Echo echocardiogram from May 2024 showed preserved left ventricular function EF around 55 to 60%  TELEMETRY: Normal sinus rhythm rate of around 70 nonspecific ST-T wave changes:  ASSESSMENT AND PLAN:  Principal Problem:   Chest pain Active Problems:   Hyperlipidemia   NSTEMI (non-ST elevated myocardial infarction) (HCC)   History of stroke   Anemia of chronic renal failure, stage 4 (severe) (HCC)   Chronic obstructive pulmonary disease (HCC)   Gout   OSA (obstructive sleep apnea)   (HFpEF) heart failure with preserved ejection fraction (HCC)   Type II diabetes mellitus with renal manifestations (HCC)   Acute renal failure superimposed on stage 4 chronic kidney disease (HCC)   Atrial fibrillation, chronic (HCC)    Plan Chest pain possibly unstable angina continue aggressive medical therapy Renal insufficiency stage IV recommend nephrology input avoid any nephrotoxic procedures or drugs Obstructive sleep  recommend sleep study CPAP weight loss Diabetes type 2 continue current therapy Obesity recommend weight loss exercise portion control Atrial fibrillation continue anticoagulation and rate control COPD continue inhalers as necessary Diabetes type 2 continue diabetes management and control Maintain Protonix therapy for reflux type symptoms Hyperlipidemia continue Lipitor therapy for lipid management History of PCI and stent DES to RCA proximal circumflex stable  continue medical therapy Continue GDMT with metoprolol Imdur Ranexa Lipitor Plavix torsemide Agree with physical therapy to help with strength and balance Should also consider cardiac rehab Heart failure clinic evaluation also may be helpful    Alwyn Pea, MD 11/03/2023 2:15 PM

## 2023-11-03 NOTE — Discharge Summary (Signed)
Physician Discharge Summary   Patient: Mike Decker MRN: [id number]  DOB: [birth year]   Admit:     Date of Admission: 10/30/2023 Admitted from: home   Discharge: Date of discharge: 11/03/23 Disposition: Home Condition at discharge: fair  CODE STATUS: FULL CODE      Discharge Physician: Sunnie Nielsen, DO [facility]     PCP: [name], MD  Recommendations for Outpatient Follow-up:  Follow up with PCP [name], MD in 1 weeks Follow up in 1-2 weeks with cardiology and nephrology  Please obtain labs/tests: CBC, BMP in 1 weeks / asap    Discharge Instructions     (HEART FAILURE PATIENTS) Call MD:  Anytime you have any of the following symptoms: 1) 3 pound weight gain in 24 hours or 5 pounds in 1 week 2) shortness of breath, with or without a dry hacking cough 3) swelling in the hands, feet or stomach 4) if you have to sleep on extra pillows at night in order to breathe.   Complete by: As directed    Diet - low sodium heart healthy   Complete by: As directed    For home use only DME 4 wheeled rolling walker with seat   Complete by: As directed    Patient needs a walker to treat with the following condition:  CHF (congestive heart failure) (HCC) CKD (chronic kidney disease), stage IV (HCC)     Increase activity slowly   Complete by: As directed          Discharge Diagnoses: Principal Problem:   Chest pain Active Problems:   NSTEMI (non-ST elevated myocardial infarction) (HCC)   Acute renal failure superimposed on stage 4 chronic kidney disease (HCC)   History of stroke   Type II diabetes mellitus with renal manifestations (HCC)   Hyperlipidemia   Anemia of chronic renal failure, stage 4 (severe) (HCC)   Gout   Chronic obstructive pulmonary disease (HCC)   OSA (obstructive sleep apnea)   (HFpEF) heart failure with preserved ejection fraction (HCC)   Atrial fibrillation, chronic (HCC)       HPI: Mike Decker is a 82  y.o. male  with a past medical history of CAD s/p PCI w/ DES RCA, prox LCx, jailed OM 2 but good flow, HFpEF (EF 55-60% 08/2022), mild to moderate AS (mean grad 19 mmHg), CKD 4, type 2 diabetes, carotid stenosis, COPD, OSA on CPAP, Barrett's esophagus, history of prostate cancer who presented to the ED on 10/30/2023 for chest pain. Patient reports that at 2:30 AM he woke up with chest pain. States he waited 10 minutes but the pain persisted so he took 3 nitroglycerin 5 minutes apart with no relief. He states that he has chronic chest pain but this episode was worse than usual. He denies any exertional symptoms with this episode today. He called EMS.   Of note, Left heart cath done in 12/2022 demonstrated patent prior stents and diffuse disease not significant enough to indicate stenting.    Hospital course / significant events:  11/20: admitted to hospitalist service w/ cardiology consult. No heparin or cardiac intervention. Meds adjusted. AKI monitoring.  11/21: Cr improved but not quite back to baseline. Pt feeling some abd distension/SOB  11/22-11/23: significant SOB and dizziness on ambulation, likely d/t medication dosage adjustment w/ beta blocker. Eval for O2 need. PT eval. He is very reluctant to go home given recurrent chest pain and dizziness. VS have been stable. Cardiology following  11/24: ambulating  well, needing to stop to rest but otherwise ok. Dry today, no rales/edema. CP improved. Seems to be at baseline. Walker w/ seat ordered. PT/OT eval and does not need HH/SNF, does not need O2. Stable for discharge but recommend close outpatient f/u w/ cardiology/nephrology, advise reduce diuretics and can increase prn based on symptoms   Consultants:  Cardiology  Procedures/Surgeries: None       ASSESSMENT & PLAN:   Chest pain Coronary artery disease s/p multiple prior stents EKG stable from prior, nonacute.  Cardiology following - pt is clear for discharge from their perspective    metoprolol 100 mg twice daily increased to 150 mg bid and pt was dizzy, will reduce down to 50 this has helped  imdur 120 mg twice daily.  ranexa 1000 mg twice daily.  lipitor 80 mg daily plavix 75 mg daily.  No plan for further cardiac diagnostics.  Per cardiology, stable for discharge  Long discussion w/ patient re intermittent angina and lightheadedness may be baseline given cardiac conditions and medications. Likely would benefit from outpatient palliative care if symptoms persist    Hypotension, postural dizziness Orthoststic VS no concerns  Reduced metoprolol  PT/OT to work with him   Chronic HFpEF Moderate aortic stenosis Cardiorenal syndrome  Appears euvolemic  Torsemide 40 mg daily reduced to 20 mg daily to increase prn edema, weight gain, orthopnea.  Close follow up cardiology   Chronic atrial fibrillation Patient with chronic AF not on anticoagulation due to bleeding issues. Rate controlled on tele.  See plan above for metoprolol.    AKI on CKD IV Continue to monitor renal function closely. See above re: diuretic adjustments              Discharge Instructions  Allergies as of 11/03/2023       Reactions   Gabapentin    Other reaction(s): Other (See Comments) Tremors   Peanut-containing Drug Products Anaphylaxis   Penicillins Hives, Rash   Has patient had a PCN reaction causing immediate rash, facial/tongue/throat swelling, SOB or lightheadedness with hypotension: Yes Has patient had a PCN reaction causing severe rash involving mucus membranes or skin necrosis: No Has patient had a PCN reaction that required hospitalization No Has patient had a PCN reaction occurring within the last 10 years: No If all of the above answers are "NO", then may proceed with Cephalosporin use.   Bee Venom Swelling   Influenza Vaccines Hives   Inh [isoniazid] Hives   Jardiance [empagliflozin] Other (See Comments)   Diarrhea   Kenalog [triamcinolone Acetonide] Hives    Levaquin [levofloxacin] Other (See Comments)   Tendon, ligament pain.    Lisinopril    Other reaction(s): Hyperkalemia   Nalfon [fenoprofen Calcium] Hives   Naproxen    Peanut Oil    Nsaids Rash   Nalfon 600 Nalfon 600        Medication List     STOP taking these medications    allopurinol 100 MG tablet Commonly known as: ZYLOPRIM   hydrALAZINE 50 MG tablet Commonly known as: APRESOLINE       TAKE these medications    acetaminophen 500 MG tablet Commonly known as: TYLENOL Take 1,000 mg by mouth every 8 (eight) hours as needed for mild pain.   atorvastatin 80 MG tablet Commonly known as: LIPITOR Take 1 tablet (80 mg total) by mouth daily.   azelastine 0.1 % nasal spray Commonly known as: ASTELIN USE 1 TO 2 SPRAYS IN EACH NOSTRIL TWICE A DAY  calcitRIOL 0.25 MCG capsule Commonly known as: ROCALTROL Take 0.25 mcg by mouth daily.   cetirizine 5 MG tablet Commonly known as: ZYRTEC Take 5 mg by mouth daily.   citalopram 10 MG tablet Commonly known as: CELEXA Take 1 tablet (10 mg total) by mouth daily.   clopidogrel 75 MG tablet Commonly known as: PLAVIX TAKE 1 TABLET BY MOUTH EVERY DAY   colchicine 0.6 MG tablet Take 3 tablets (1.8 mg total) by mouth daily as needed (gout flares).   [employer] VITAMIN B12 1000 MCG tablet Generic drug: cyanocobalamin TAKE 1 TABLET BY MOUTH EVERY DAY   Denta 5000 Plus 1.1 % Crea dental cream Generic drug: sodium fluoride See admin instructions.   EPINEPHrine 0.3 mg/0.3 mL Soaj injection Commonly known as: EPI-PEN Inject into the muscle as needed (anaphylaxis).   ergocalciferol 1.25 MG (50000 UT) capsule Commonly known as: VITAMIN D2 Take 50,000 Units by mouth every 30 (thirty) days.   ferrous sulfate 325 (65 FE) MG EC tablet TAKE 1 TABLET BY MOUTH EVERY DAY   fluticasone 50 MCG/ACT nasal spray Commonly known as: FLONASE USE 1 SPRAY IN EACH NOSTRIL EVERY DAY   folic acid 400 MCG tablet Commonly known as:  FOLVITE Take 400 mcg by mouth daily.   insulin aspart 100 UNIT/ML FlexPen Commonly known as: NOVOLOG 10 UNITS IN AM, 30 UNITS AT LUNCH, 30 UNITS AT DINNER   insulin glargine 100 UNIT/ML injection Commonly known as: LANTUS Inject 0.35 mLs (35 Units total) into the skin at bedtime. This is a decrease from your previous 45 units nightly. What changed:  how much to take additional instructions   isosorbide mononitrate 120 MG 24 hr tablet Commonly known as: IMDUR Take 1 tablet (120 mg total) by mouth 2 (two) times daily. What changed:  medication strength how much to take when to take this   Jardiance 10 MG Tabs tablet Generic drug: empagliflozin Take 10 mg by mouth daily.   magnesium oxide 400 (240 Mg) MG tablet Commonly known as: MAG-OX Take 1 tablet by mouth daily.   metoprolol succinate 50 MG 24 hr tablet Commonly known as: TOPROL-XL Take 1 tablet by mouth daily.   mometasone 220 MCG/ACT inhaler Commonly known as: ASMANEX Inhale 2 puffs into the lungs daily.   montelukast 10 MG tablet Commonly known as: SINGULAIR TAKE 1 TABLET AT BEDTIME   nitroGLYCERIN 0.4 MG SL tablet Commonly known as: NITROSTAT Place 0.4 mg under the tongue every 5 (five) minutes x 3 doses as needed for chest pain.   pantoprazole 40 MG tablet Commonly known as: PROTONIX Take 1 tablet (40 mg total) by mouth 2 (two) times daily before a meal.   ranolazine 1000 MG SR tablet Commonly known as: RANEXA Take 1 tablet (1,000 mg total) by mouth daily. Start taking on: November 04, 2023 What changed:  medication strength how much to take   sodium bicarbonate 650 MG tablet Take 650 mg by mouth 2 (two) times daily.   Stiolto Respimat 2.5-2.5 MCG/ACT Aers Generic drug: Tiotropium Bromide-Olodaterol Inhale 1 each into the lungs daily.   torsemide 20 MG tablet Commonly known as: DEMADEX Take 1 tablet (20 mg total) by mouth daily. Increase to 1 tablet (20 mg total) by mouth TWICE daily (total  daily dose 40 mg) as needed for up to 3 days for increased leg swelling, shortness of breath, weight gain 5+ lbs over 1-2 days. Seek medical care if these symptoms are not improving with increased dose. What changed:  medication strength  how much to take when to take this additional instructions               Durable Medical Equipment  (From admission, onward)           Start     Ordered   11/03/23 0000  For home use only DME 4 wheeled rolling walker with seat       Question Answer Comment  Patient needs a walker to treat with the following condition CHF (congestive heart failure) (HCC)   Patient needs a walker to treat with the following condition CKD (chronic kidney disease), stage IV (HCC)      11/03/23 1608             Follow-up Information     Callwood, Gerda Diss D, MD. Go in 1 week(s).   Specialties: Cardiology, Internal Medicine Contact information: [address] [place] [address] [phone]         [name], MD. Schedule an appointment as soon as possible for a visit.   Specialties: Internal Medicine, Cardiology Why: hospital follow up 1-2 weeks Contact information: [address] [place] [address] [phone]         Lamont Dowdy, MD. Schedule an appointment as soon as possible for a visit.   Specialty: Nephrology Why: hospital follow up 1-2 weeks Contact information: [address] [place] [address] [phone]                 Allergies  Allergen Reactions   Gabapentin     Other reaction(s): Other (See Comments) Tremors   Peanut-Containing Drug Products Anaphylaxis   Penicillins Hives and Rash    Has patient had a PCN reaction causing immediate rash, facial/tongue/throat swelling, SOB or lightheadedness with hypotension: Yes Has patient had a PCN reaction causing severe rash involving mucus membranes or skin necrosis: No Has patient had a PCN reaction that required  hospitalization No Has patient had a PCN reaction occurring within the last 10 years: No If all of the above answers are "NO", then may proceed with Cephalosporin use.   Bee Venom Swelling   Influenza Vaccines Hives   Inh [Isoniazid] Hives   Jardiance [Empagliflozin] Other (See Comments)    Diarrhea   Kenalog [Triamcinolone Acetonide] Hives   Levaquin [Levofloxacin] Other (See Comments)    Tendon, ligament pain.    Lisinopril     Other reaction(s): Hyperkalemia   Nalfon [Fenoprofen Calcium] Hives   Naproxen    Peanut Oil    Nsaids Rash    Nalfon 600 Nalfon 600     Subjective: pt reports SOB on exertion but resolved w/ rest fairly easily, occasional chest pain but tylenol helps.    Discharge Exam: BP (!) 131/51 (BP Location: Right Arm)   Pulse 68   Temp 97.8 F (36.6 C)   Resp 16   Ht 5\' 6"  (1.676 m)   Wt 86.1 kg   SpO2 100%   BMI 30.64 kg/m  General: Pt is alert, awake, not in acute distress Cardiovascular: RRR, S1/S2 +murmur, no rubs, no gallops Respiratory: CTA bilaterally, no wheezing, no rhonchi Abdominal: Soft, NT, ND Extremities: no edema, no cyanosis     The results of significant diagnostics from this hospitalization (including imaging, microbiology, ancillary and laboratory) are listed below for reference.     Microbiology: No results found for this or any previous visit (from the past 240 hour(s)).   Labs: BNP (last 3 results) Recent Labs  04/18/23 0449 10/30/23 0426  BNP 283.4* 110.8*   Basic Metabolic Panel: Recent Labs  Lab 10/30/23 0531 10/31/23 0440 11/01/23 0454 11/02/23 0427 11/03/23 0337  NA 132* 132* 134* 131* 130*  K 4.4 4.8 4.1 3.9 4.4  CL 98 97* 98 98 93*  CO2 25 24 23 22 22   GLUCOSE 199* 233* 170* 107* 188*  BUN 77* 71* 80* 77* 80*  CREATININE 4.03* 3.57* 3.09* 2.87* 3.20*  CALCIUM 9.0 8.9 9.0 8.9 8.9  MG 2.6*  --   --   --   --    Liver Function Tests: Recent Labs  Lab 10/30/23 0531  AST 18  ALT 15  ALKPHOS  55  BILITOT 0.8  PROT 7.3  ALBUMIN 4.3   No results for input(s): "LIPASE", "AMYLASE" in the last 168 hours. No results for input(s): "AMMONIA" in the last 168 hours. CBC: Recent Labs  Lab 10/30/23 0426 10/31/23 0440  WBC 9.1 9.4  NEUTROABS 6.1  --   HGB 12.4* 11.7*  HCT 37.4* 35.3*  MCV 97.1 97.5  PLT 243 236   Cardiac Enzymes: No results for input(s): "CKTOTAL", "CKMB", "CKMBINDEX", "TROPONINI" in the last 168 hours. BNP: Invalid input(s): "POCBNP" CBG: Recent Labs  Lab 11/02/23 1609 11/02/23 2116 11/03/23 0745 11/03/23 1134 11/03/23 1612  GLUCAP 234* 302* 225* 206* 160*   D-Dimer No results for input(s): "DDIMER" in the last 72 hours. Hgb A1c No results for input(s): "HGBA1C" in the last 72 hours. Lipid Profile No results for input(s): "CHOL", "HDL", "LDLCALC", "TRIG", "CHOLHDL", "LDLDIRECT" in the last 72 hours. Thyroid function studies No results for input(s): "TSH", "T4TOTAL", "T3FREE", "THYROIDAB" in the last 72 hours.  Invalid input(s): "FREET3" Anemia work up No results for input(s): "VITAMINB12", "FOLATE", "FERRITIN", "TIBC", "IRON", "RETICCTPCT" in the last 72 hours. Urinalysis    Component Value Date/Time   COLORURINE YELLOW (A) 04/18/2023 0908   APPEARANCEUR CLEAR (A) 04/18/2023 0908   LABSPEC 1.008 04/18/2023 0908   PHURINE 5.0 04/18/2023 0908   GLUCOSEU >=500 (A) 04/18/2023 0908   HGBUR NEGATIVE 04/18/2023 0908   BILIRUBINUR NEGATIVE 04/18/2023 0908   BILIRUBINUR 1+ 11/01/2020 1512   KETONESUR NEGATIVE 04/18/2023 0908   PROTEINUR NEGATIVE 04/18/2023 0908   UROBILINOGEN 2.0 (A) 11/01/2020 1512   NITRITE NEGATIVE 04/18/2023 0908   LEUKOCYTESUR NEGATIVE 04/18/2023 0908   Sepsis Labs Recent Labs  Lab 10/30/23 0426 10/31/23 0440  WBC 9.1 9.4   Microbiology No results found for this or any previous visit (from the past 240 hour(s)). Imaging US RENAL  Result Date: 10/30/2023 CLINICAL DATA:  Acute kidney injury EXAM: RENAL / URINARY  TRACT ULTRASOUND COMPLETE COMPARISON:  None Available. FINDINGS: Right Kidney: Renal measurements: 10.7 x 5.2 x 4.7 cm = volume: 136 mL. Echogenic renal parenchyma. 8 x 8 x 8 mm exophytic upper pole cyst. No hydronephrosis. Left Kidney: Renal measurements: 10.1 x 4.9 x 4.8 cm = volume: 124 mL. Echogenic renal parenchyma. No mass or hydronephrosis visualized. Bladder: Appears normal for degree of bladder distention. Other: None. IMPRESSION: Echogenic renal parenchyma, suggesting medical renal disease. 8 mm exophytic right upper pole cyst, benign. No hydronephrosis. Electronically Signed   By: Charline Bills M.D.   On: 10/30/2023 16:49   DG Chest Portable 1 View  Result Date: 10/30/2023 CLINICAL DATA:  82 year old male with sudden onset chest pain, pressure. Similar to 2 previous NSTEMI. EXAM: PORTABLE CHEST 1 VIEW COMPARISON:  Chest radiographs 09/29/2023. FINDINGS: Portable AP upright view at 0449 hours. Stable lung volumes and mediastinal  contours. Lower mediastinal lipomatosis demonstrated on CT earlier this year. Subpleural lung scarring and some emphysema also demonstrated on that exam. But Allowing for portable technique the lungs are clear. No pneumothorax or pleural effusion. Visualized tracheal air column is within normal limits. No acute osseous abnormality identified. IMPRESSION: No acute cardiopulmonary abnormality. Electronically Signed   By: Odessa Fleming M.D.   On: 10/30/2023 04:59      Time coordinating discharge: over 30 minutes  SIGNED:  Sunnie Nielsen DO [facility]

## 2023-11-03 NOTE — Progress Notes (Signed)
Assisted pt with walking from room, around nurses' station, and back to room SpO2 remained between 95% and 100%, but pt had to sit halfway around because his legs felt "loose." Pt stated his knees give out on him sometimes and because of that he's almost fallen before. MD aware.

## 2023-11-03 NOTE — TOC Transition Note (Addendum)
Transition of Care [facility]) - CM/SW Discharge Note   Patient Details  Name: Mike Decker MRN: [id number] Date of Birth: [birth year]  Transition of Care [facility]) CM/SW Contact:  Bing Quarry, RN Phone Number: 11/03/2023, 4:43 PM   Clinical Narrative: 11/24: New discharge at 430 pm. Patient needs rollator per provider. Contacted patient and [employer]. To be delivered to room today before discharge. Patient received a walker 6-8 years ago but was informed if was in last 5 years he could receive a bill. Updated provider.   To continue with outpatient cardiac rehab per provider.   Gabriel Cirri MSN RN CM  Care Management Department.  [place]  [facility] Direct Dial: [phone] Main Office Phone: [phone] Weekends Only              Patient Goals and CMS Choice      Discharge Placement                         Discharge Plan and Services Additional resources added to the After Visit Summary for                  DME Arranged: Walker rolling with seat DME Agency: [employer] Date DME Agency Contacted: 11/03/23 Time DME Agency Contacted: 1642 Representative spoke with at DME Agency: Tawanna Cooler Arranged: NA          Social Determinants of Health (SDOH) Interventions SDOH Screenings   Food Insecurity: No Food Insecurity (10/30/2023)  Housing: Low Risk  (10/30/2023)  Transportation Needs: No Transportation Needs (10/30/2023)  Utilities: Not At Risk (10/30/2023)  Alcohol Screen: Low Risk  (09/04/2018)  Depression (PHQ2-9): Medium Risk (08/21/2023)  Financial Resource Strain: Low Risk  (06/21/2023)   Received from [facility]  Physical Activity: Insufficiently Active (09/15/2021)  Social Connections: Moderately Isolated (09/15/2021)  Stress: No Stress Concern Present (09/15/2021)  Tobacco Use: Medium Risk (10/31/2023)     Readmission Risk Interventions    10/30/2023   11:28 AM  Readmission Risk Prevention Plan  Transportation  Screening Complete  PCP or Specialist Appt within 5-7 Days Complete  Home Care Screening Complete  Medication Review (RN CM) Complete

## 2023-11-11 ENCOUNTER — Encounter: Payer: Medicare Other | Attending: Internal Medicine | Admitting: *Deleted

## 2023-11-12 DIAGNOSIS — D631 Anemia in chronic kidney disease: Secondary | ICD-10-CM | POA: Diagnosis not present

## 2023-11-12 DIAGNOSIS — N2581 Secondary hyperparathyroidism of renal origin: Secondary | ICD-10-CM | POA: Diagnosis not present

## 2023-11-12 DIAGNOSIS — E875 Hyperkalemia: Secondary | ICD-10-CM | POA: Diagnosis not present

## 2023-11-12 DIAGNOSIS — I129 Hypertensive chronic kidney disease with stage 1 through stage 4 chronic kidney disease, or unspecified chronic kidney disease: Secondary | ICD-10-CM | POA: Diagnosis not present

## 2023-11-12 DIAGNOSIS — E1122 Type 2 diabetes mellitus with diabetic chronic kidney disease: Secondary | ICD-10-CM | POA: Diagnosis not present

## 2023-11-12 DIAGNOSIS — N184 Chronic kidney disease, stage 4 (severe): Secondary | ICD-10-CM | POA: Diagnosis not present

## 2023-11-13 ENCOUNTER — Encounter: Payer: Medicare Other | Admitting: *Deleted

## 2023-11-14 DIAGNOSIS — I5032 Chronic diastolic (congestive) heart failure: Secondary | ICD-10-CM | POA: Diagnosis not present

## 2023-11-14 DIAGNOSIS — N1832 Chronic kidney disease, stage 3b: Secondary | ICD-10-CM | POA: Diagnosis not present

## 2023-11-14 DIAGNOSIS — I10 Essential (primary) hypertension: Secondary | ICD-10-CM | POA: Diagnosis not present

## 2023-11-14 DIAGNOSIS — I129 Hypertensive chronic kidney disease with stage 1 through stage 4 chronic kidney disease, or unspecified chronic kidney disease: Secondary | ICD-10-CM | POA: Diagnosis not present

## 2023-11-14 DIAGNOSIS — R079 Chest pain, unspecified: Secondary | ICD-10-CM | POA: Diagnosis not present

## 2023-11-14 DIAGNOSIS — N184 Chronic kidney disease, stage 4 (severe): Secondary | ICD-10-CM | POA: Diagnosis not present

## 2023-11-14 DIAGNOSIS — E8722 Chronic metabolic acidosis: Secondary | ICD-10-CM | POA: Diagnosis not present

## 2023-11-14 DIAGNOSIS — I214 Non-ST elevation (NSTEMI) myocardial infarction: Secondary | ICD-10-CM | POA: Diagnosis not present

## 2023-11-14 DIAGNOSIS — E1122 Type 2 diabetes mellitus with diabetic chronic kidney disease: Secondary | ICD-10-CM | POA: Diagnosis not present

## 2023-11-14 DIAGNOSIS — E871 Hypo-osmolality and hyponatremia: Secondary | ICD-10-CM | POA: Diagnosis not present

## 2023-11-14 DIAGNOSIS — Z8673 Personal history of transient ischemic attack (TIA), and cerebral infarction without residual deficits: Secondary | ICD-10-CM | POA: Diagnosis not present

## 2023-11-14 DIAGNOSIS — I739 Peripheral vascular disease, unspecified: Secondary | ICD-10-CM | POA: Diagnosis not present

## 2023-11-14 DIAGNOSIS — I482 Chronic atrial fibrillation, unspecified: Secondary | ICD-10-CM | POA: Diagnosis not present

## 2023-11-14 DIAGNOSIS — R0602 Shortness of breath: Secondary | ICD-10-CM | POA: Diagnosis not present

## 2023-11-14 DIAGNOSIS — E875 Hyperkalemia: Secondary | ICD-10-CM | POA: Diagnosis not present

## 2023-11-14 DIAGNOSIS — D631 Anemia in chronic kidney disease: Secondary | ICD-10-CM | POA: Diagnosis not present

## 2023-11-14 DIAGNOSIS — I2510 Atherosclerotic heart disease of native coronary artery without angina pectoris: Secondary | ICD-10-CM | POA: Diagnosis not present

## 2023-11-14 DIAGNOSIS — E782 Mixed hyperlipidemia: Secondary | ICD-10-CM | POA: Diagnosis not present

## 2023-11-14 DIAGNOSIS — N2581 Secondary hyperparathyroidism of renal origin: Secondary | ICD-10-CM | POA: Diagnosis not present

## 2023-11-18 DIAGNOSIS — E1142 Type 2 diabetes mellitus with diabetic polyneuropathy: Secondary | ICD-10-CM | POA: Diagnosis not present

## 2023-11-18 DIAGNOSIS — Z9289 Personal history of other medical treatment: Secondary | ICD-10-CM | POA: Diagnosis not present

## 2023-11-18 DIAGNOSIS — E1159 Type 2 diabetes mellitus with other circulatory complications: Secondary | ICD-10-CM | POA: Diagnosis not present

## 2023-11-18 DIAGNOSIS — Z794 Long term (current) use of insulin: Secondary | ICD-10-CM | POA: Diagnosis not present

## 2023-11-18 DIAGNOSIS — I2510 Atherosclerotic heart disease of native coronary artery without angina pectoris: Secondary | ICD-10-CM | POA: Diagnosis not present

## 2023-11-18 DIAGNOSIS — E1122 Type 2 diabetes mellitus with diabetic chronic kidney disease: Secondary | ICD-10-CM | POA: Diagnosis not present

## 2023-11-18 DIAGNOSIS — E1129 Type 2 diabetes mellitus with other diabetic kidney complication: Secondary | ICD-10-CM | POA: Diagnosis not present

## 2023-11-18 DIAGNOSIS — R809 Proteinuria, unspecified: Secondary | ICD-10-CM | POA: Diagnosis not present

## 2023-11-18 DIAGNOSIS — N184 Chronic kidney disease, stage 4 (severe): Secondary | ICD-10-CM | POA: Diagnosis not present

## 2023-11-19 ENCOUNTER — Other Ambulatory Visit: Payer: Self-pay

## 2023-11-19 ENCOUNTER — Other Ambulatory Visit: Payer: Self-pay | Admitting: Internal Medicine

## 2023-11-19 ENCOUNTER — Emergency Department
Admission: EM | Admit: 2023-11-19 | Discharge: 2023-11-19 | Disposition: A | Discharge status: Home / Self Care | Payer: Medicare Other | Attending: Emergency Medicine | Admitting: Emergency Medicine

## 2023-11-19 ENCOUNTER — Encounter: Payer: Self-pay | Admitting: *Deleted

## 2023-11-19 ENCOUNTER — Emergency Department: Payer: Medicare Other

## 2023-11-19 DIAGNOSIS — R0789 Other chest pain: Secondary | ICD-10-CM | POA: Diagnosis not present

## 2023-11-19 DIAGNOSIS — N179 Acute kidney failure, unspecified: Secondary | ICD-10-CM | POA: Diagnosis not present

## 2023-11-19 DIAGNOSIS — G8929 Other chronic pain: Secondary | ICD-10-CM | POA: Diagnosis not present

## 2023-11-19 DIAGNOSIS — I10 Essential (primary) hypertension: Secondary | ICD-10-CM | POA: Diagnosis not present

## 2023-11-19 DIAGNOSIS — I482 Chronic atrial fibrillation, unspecified: Secondary | ICD-10-CM | POA: Diagnosis not present

## 2023-11-19 DIAGNOSIS — R079 Chest pain, unspecified: Secondary | ICD-10-CM | POA: Diagnosis not present

## 2023-11-19 DIAGNOSIS — I25119 Atherosclerotic heart disease of native coronary artery with unspecified angina pectoris: Secondary | ICD-10-CM | POA: Diagnosis not present

## 2023-11-19 DIAGNOSIS — I5030 Unspecified diastolic (congestive) heart failure: Secondary | ICD-10-CM | POA: Diagnosis not present

## 2023-11-19 DIAGNOSIS — I2510 Atherosclerotic heart disease of native coronary artery without angina pectoris: Secondary | ICD-10-CM | POA: Diagnosis not present

## 2023-11-19 DIAGNOSIS — Z8673 Personal history of transient ischemic attack (TIA), and cerebral infarction without residual deficits: Secondary | ICD-10-CM | POA: Insufficient documentation

## 2023-11-19 DIAGNOSIS — I959 Hypotension, unspecified: Secondary | ICD-10-CM | POA: Diagnosis not present

## 2023-11-19 DIAGNOSIS — I2089 Other forms of angina pectoris: Secondary | ICD-10-CM

## 2023-11-19 DIAGNOSIS — I350 Nonrheumatic aortic (valve) stenosis: Secondary | ICD-10-CM | POA: Diagnosis not present

## 2023-11-19 LAB — BASIC METABOLIC PANEL
Anion gap: 14 (ref 5–15)
BUN: 83 mg/dL — ABNORMAL HIGH (ref 8–23)
CO2: 20 mmol/L — ABNORMAL LOW (ref 22–32)
Calcium: 8.7 mg/dL — ABNORMAL LOW (ref 8.9–10.3)
Chloride: 100 mmol/L (ref 98–111)
Creatinine, Ser: 3.25 mg/dL — ABNORMAL HIGH (ref 0.61–1.24)
GFR, Estimated: 18 mL/min — ABNORMAL LOW (ref >60)
Glucose, Bld: 259 mg/dL — ABNORMAL HIGH (ref 70–99)
Potassium: 4 mmol/L (ref 3.5–5.1)
Sodium: 134 mmol/L — ABNORMAL LOW (ref 135–145)

## 2023-11-19 LAB — CBC
HCT: 36.2 % — ABNORMAL LOW (ref 39.0–52.0)
Hemoglobin: 12 g/dL — ABNORMAL LOW (ref 13.0–17.0)
MCH: 31.2 pg (ref 26.0–34.0)
MCHC: 33.1 g/dL (ref 30.0–36.0)
MCV: 94 fL (ref 80.0–100.0)
Platelets: 271 10*3/uL (ref 150–400)
RBC: 3.85 MIL/uL — ABNORMAL LOW (ref 4.22–5.81)
RDW: 14.2 % (ref 11.5–15.5)
WBC: 8.7 10*3/uL (ref 4.0–10.5)
nRBC: 0 % (ref 0.0–0.2)

## 2023-11-19 LAB — TROPONIN I (HIGH SENSITIVITY)
Troponin I (High Sensitivity): 30 ng/L — ABNORMAL HIGH (ref <18)
Troponin I (High Sensitivity): 29 ng/L — ABNORMAL HIGH (ref <18)

## 2023-11-19 MED ORDER — CLOPIDOGREL BISULFATE 75 MG PO TABS
75.0000 mg | ORAL_TABLET | Freq: Once | ORAL | Status: AC
  Administered 2023-11-19: 75 mg via ORAL
  Filled 2023-11-19: qty 1

## 2023-11-19 MED ORDER — METOPROLOL SUCCINATE ER 50 MG PO TB24
50.0000 mg | ORAL_TABLET | ORAL | Status: AC
  Administered 2023-11-19: 50 mg via ORAL
  Filled 2023-11-19: qty 1

## 2023-11-19 MED ORDER — ISOSORBIDE MONONITRATE ER 60 MG PO TB24
120.0000 mg | ORAL_TABLET | ORAL | Status: AC
  Administered 2023-11-19: 120 mg via ORAL
  Filled 2023-11-19: qty 2

## 2023-11-19 MED ORDER — NITROGLYCERIN 0.4 MG SL SUBL
0.4000 mg | SUBLINGUAL_TABLET | SUBLINGUAL | Status: AC
  Administered 2023-11-19: 0.4 mg via SUBLINGUAL
  Filled 2023-11-19: qty 1

## 2023-11-19 NOTE — ED Notes (Signed)
See triage note  Presents with chest pain  State pain started early this am  Took 3 NTG's at home w/o relief  Skin w/d on arrival to treatment room

## 2023-11-19 NOTE — Progress Notes (Signed)
Orders only encounter for cardiac PET stress.   Gale Journey, PA-C

## 2023-11-19 NOTE — ED Provider Notes (Signed)
[facility] Provider Note    Event Date/Time   First MD Initiated Contact with Patient 11/19/23 [phone]     (approximate)   History   Chest Pain   HPI  Mike Decker is a 82 y.o. male    About 1 AM he woke during sleep with chest pain.  About 8 out of 10 a pressure in his chest that radiates towards her left jaw.  It is slightly better but still persists.  Took 3 nitroglycerin tablets and 325 mg of aspirin  Reports similar symptoms about 2 weeks ago admitted to the hospital, under the care of Dr. Juliann Pares.  He has been doing well the last 2 weeks except this pain has once again recurred this morning.  There is no leg swelling.  He reports his blood pressure seems to be high he has not taken his medication yet this morning.  He is also using Nitropaste/patches that cardiology recently prescribed him which he removed at 11 PM last night   Reviewed patient's primary visit with Dr. Tedd Sias, yesterday.  Adjustments were made to his Lantus and NovoLog.  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  Physical Exam   Triage Vital Signs: ED Triage Vitals  Encounter Vitals Group     BP 11/19/23 0304 (!) 152/57     Systolic BP Percentile --      Diastolic BP Percentile --      Pulse Rate 11/19/23 0304 60     Resp 11/19/23 0304 18     Temp 11/19/23 0304 98.1 F (36.7 C)     Temp Source 11/19/23 0304 Oral     SpO2 11/19/23 0304 100 %     Weight 11/19/23 0256 179 lb (81.2 kg)     Height 11/19/23 0256 5\' 6"  (1.676 m)     Head Circumference --      Peak Flow --      Pain Score 11/19/23 0256 8     Pain Loc --      Pain Education --      Exclude from Growth Chart --     Most recent vital signs: Vitals:   11/19/23 1004 11/19/23 1005  BP: (!) 121/92   Pulse: (!) 58   Resp:    Temp: 98 F (36.7 C) 98 F (36.7 C)  SpO2: 98%      General: Awake, no  distress.  Very pleasant CV:  Good peripheral perfusion.  Normal tones and rate Resp:  Normal effort.  Clear bilateral Abd:  No distention.  Other:  Lower extremity edema or venous cords.   ED Results / Procedures / Treatments   Labs (all labs ordered are listed, but only abnormal results are displayed) Labs Reviewed  BASIC METABOLIC PANEL - Abnormal; Notable for the following components:      Result Value   Sodium 134 (*)    CO2 20 (*)    Glucose, Bld 259 (*)    BUN 83 (*)    Creatinine, Ser 3.25 (*)    Calcium 8.7 (*)    GFR, Estimated 18 (*)    All other components within normal limits  CBC - Abnormal; Notable for the following components:   RBC 3.85 (*)    Hemoglobin 12.0 (*)    HCT 36.2 (*)    All other components within normal limits  TROPONIN I (HIGH SENSITIVITY) - Abnormal; Notable  for the following components:   Troponin I (High Sensitivity) 30 (*)    All other components within normal limits  TROPONIN I (HIGH SENSITIVITY) - Abnormal; Notable for the following components:   Troponin I (High Sensitivity) 29 (*)    All other components within normal limits   Notably chronic renal disease with stable creatinine from last admission.  GFR 18.  Chronic anemia.  Troponin 30-29, very minimally in no elevated, less so than previous admission and less than his previous admission for NSTEMI  EKG  And interpreted by me at 7:36 AM, initial ECG was obtained at 255 Heart rate 60 QRS 120 QTc 440 Normal sinus rhythm, no evidence of acute ischemia   RADIOLOGY  DG Chest 2 View  Result Date: 11/19/2023 CLINICAL DATA:  Chest pain for several hours, initial encounter EXAM: CHEST - 2 VIEW COMPARISON:  10/30/2023 FINDINGS: The heart size and mediastinal contours are within normal limits. Both lungs are clear. The visualized skeletal structures are unremarkable. IMPRESSION: No active cardiopulmonary disease. Electronically Signed   By: Alcide Clever M.D.   On: 11/19/2023 03:23     Chest x-ray interpreted by me as negative for acute  PROCEDURES:  Critical Care performed: No  Procedures   MEDICATIONS ORDERED IN ED: Medications  clopidogrel (PLAVIX) tablet 75 mg (75 mg Oral Given 11/19/23 0831)  isosorbide mononitrate (IMDUR) 24 hr tablet 120 mg (120 mg Oral Given 11/19/23 0831)  metoprolol succinate (TOPROL-XL) 24 hr tablet 50 mg (50 mg Oral Given 11/19/23 0831)  nitroGLYCERIN (NITROSTAT) SL tablet 0.4 mg (0.4 mg Sublingual Given 11/19/23 0831)     IMPRESSION / MDM / ASSESSMENT AND PLAN / ED COURSE  I reviewed the triage vital signs and the nursing notes.                              Differential diagnosis includes, but is not limited to, ACS, aortic dissection, pulmonary embolism, cardiac tamponade, pneumothorax, pneumonia, pericarditis, myocarditis, GI-related causes including esophagitis/gastritis, and musculoskeletal chest wall pain.  Symptoms do not suggest any GI abdominal acute pulmonary or obvious component that would suggest dissection or PE.  Symptoms seem to be consistent with previous  Given his known symptoms of previous unstable angina I am suspicious this represents same.  Appears to be having an anginal equivalent for potential ACS.  He is certainly high risk.  His last cardiac catheterization was several months ago and he has been managed closely by the [facility] clinic cardiology team with medical management since    Patient's presentation is most consistent with acute complicated illness / injury requiring diagnostic workup.  ----------------------------------------- 8:03 AM on 11/19/2023 ----------------------------------------- I have consulted with patient's cardiologist Dr. Juliann Pares, reviewed case, labs, history, who advises he is very familiar with the patient.  His team will provide consult for further recommendations and disposition today in the ER.  He does advise that medical management is anticipated, advising that given the  patient's renal function repeat cardiac catheterization would seem to be very risky and patient has had previous noninterventional stress testing, follow-up, and history of similar which he is also suspicious may represent morbid esophagitis like symptomatology etc.   Blood pressure is elevated 186 systolic on recheck.  Will give patient's home medications as well as a nitroglycerin tablet and reassess   ----------------------------------------- 10:26 AM on 11/19/2023 -----------------------------------------  Cardiology team is seen and evaluated, advising plan for discharge with outpatient follow-up and they will  be ordering a outpatient PET/CT for further evaluation of his chest pain.  No further recommendations regarding changes in medications or other therapy.  Return precautions and treatment recommendations provided. Patient alert, oriented. Appropriate for outpatient follow-up        FINAL CLINICAL IMPRESSION(S) / ED DIAGNOSES   Final diagnoses:  Chronic chest pain     Rx / DC Orders   ED Discharge Orders          Ordered    Ambulatory referral to Cardiology       Comments: ER follow-up   11/19/23 1024             Note:  This document was prepared using Dragon voice recognition software and may include unintentional dictation errors.   Sharyn Creamer, MD 11/19/23 1027

## 2023-11-19 NOTE — Consult Note (Signed)
[facility] CLINIC CARDIOLOGY CONSULT NOTE       Patient ID: Mike Decker MRN: [id number] DOB/AGE: 01/19/1941 82 y.o.  Admit date: 11/19/2023 Referring Physician Dr. Sharyn Creamer Primary Physician [name], MD  Primary Cardiologist Dr. Dorothyann Peng Reason for Consultation chest pain  HPI: Mike Decker is a 82 y.o. male  with a past medical history of CAD s/p PCI w/ DES RCA, prox LCx, jailed OM 2 but good flow, HFpEF (EF 55-60% 08/2022), mild to moderate AS (mean grad 19 mmHg), CKD 4, type 2 diabetes, carotid stenosis, COPD, OSA on CPAP, Barrett's esophagus, history of prostate cancer who presented to the ED on 11/19/2023 for chest pain. Cardiology was consulted for further evaluation.   Patient states that he awoke this morning around 1 AM with his typical chest pressure.  States he took 3 doses of nitroglycerin at home without relief.  These are similar to his prior episodes of angina.  Given that his symptoms persisted despite multiple doses of nitroglycerin he decided to come to the ED for further evaluation.  Workup in the ED notable for creatinine 3.25, potassium 4.0, sodium 134, hemoglobin 12.0, WBC 8.7.  Troponins 30 > 29.  EKG nonacute.  Chest x-ray without evidence of pulmonary edema or other abnormality.  BP was noted to be elevated in the ED, he had not taken any of his morning medications.  At the time of my evaluation this morning patient is resting comfortably in ED stretcher.  States that his pain is about the same.  Endorses associated shortness of breath with onset of episode but this is since resolved.  Denies any recent issues with swelling or abdominal distention.  Overall he had been doing well until this episode this morning.  Has chronic angina and this is similar to all his prior episodes.  Has known coronary disease which was determined to not be amenable to any intervention or CABG on most recent heart catheterization 12/2022.  Chronic kidney disease has  precluded additional workup as contrast for imaging would further damage to kidneys.  Review of systems complete and found to be negative unless listed above    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, esphageal   Chronic diastolic CHF (congestive heart failure) (HCC)    Colon polyp    Diabetes mellitus without complication (HCC)    Diverticulosis    Gout    Heart disease    Hemangioma    liver   Hyperlipidemia    Hypertension    Myocardial infarct [facility])    Ocular hypertension    Peripheral vascular disease (HCC)    Skin cancer    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HCC)    Sleep apnea    Vitreoretinal degeneration     Past Surgical History:  Procedure Laterality Date   CATARACT EXTRACTION  2011, 2012   COLONOSCOPY N/A 12/14/2018   Procedure: COLONOSCOPY;  Surgeon: Pasty Spillers, MD;  Location: [facility] ENDOSCOPY;  Service: Endoscopy;  Laterality: N/A;   CORONARY ANGIOPLASTY WITH STENT PLACEMENT  12/10/2010   CORONARY STENT INTERVENTION N/A 12/29/2019   Procedure: CORONARY STENT INTERVENTION;  Surgeon: Yvonne Kendall, MD;  Location: [facility] INVASIVE CV LAB;  Service: Cardiovascular;  Laterality: N/A;   ESOPHAGOGASTRODUODENOSCOPY N/A 12/14/2018   Procedure: ESOPHAGOGASTRODUODENOSCOPY (EGD);  Surgeon: Pasty Spillers, MD;  Location: [facility] ENDOSCOPY;  Service: Endoscopy;  Laterality: N/A;   ESOPHAGOGASTRODUODENOSCOPY (EGD) WITH PROPOFOL N/A 06/01/2016   Procedure: ESOPHAGOGASTRODUODENOSCOPY (EGD) WITH PROPOFOL;  Surgeon: Scot Jun, MD;  Location: [facility] ENDOSCOPY;  Service: Endoscopy;  Laterality: N/A;   ESOPHAGOGASTRODUODENOSCOPY (EGD) WITH PROPOFOL N/A 01/31/2023   Procedure: ESOPHAGOGASTRODUODENOSCOPY (EGD) WITH PROPOFOL;  Surgeon: Midge Minium, MD;  Location: [facility] ENDOSCOPY;  Service: Endoscopy;  Laterality: N/A;   FEMORAL ARTERY STENT  Right    HERNIA REPAIR     x2   INTRAOCULAR LENS INSERTION     LEFT HEART CATH AND CORONARY ANGIOGRAPHY N/A 05/09/2017   Procedure: Left Heart Cath and Coronary Angiography;  Surgeon: Alwyn Pea, MD;  Location: [facility] INVASIVE CV LAB;  Service: Cardiovascular;  Laterality: N/A;   LEFT HEART CATH AND CORONARY ANGIOGRAPHY N/A 12/08/2018   Procedure: LEFT HEART CATH AND CORONARY ANGIOGRAPHY and possible PCI and stent;  Surgeon: Alwyn Pea, MD;  Location: [facility] INVASIVE CV LAB;  Service: Cardiovascular;  Laterality: N/A;   LEFT HEART CATH AND CORONARY ANGIOGRAPHY N/A 12/29/2019   Procedure: LEFT HEART CATH AND CORONARY ANGIOGRAPHY;  Surgeon: Yvonne Kendall, MD;  Location: [facility] INVASIVE CV LAB;  Service: Cardiovascular;  Laterality: N/A;   LEFT HEART CATH AND CORONARY ANGIOGRAPHY Left 12/13/2022   Procedure: LEFT HEART CATH AND CORONARY ANGIOGRAPHY;  Surgeon: Alwyn Pea, MD;  Location: [facility] INVASIVE CV LAB;  Service: Cardiovascular;  Laterality: Left;   NOSE SURGERY     submucous resection   PROSTATE SURGERY  12/10/2000   ROTATOR CUFF REPAIR Right     (Not in a hospital admission)  Social History   Socioeconomic History   Marital status: Married    Spouse name: Not on file   Number of children: 2   Years of education: College 3 years   Highest education level: Associate degree: occupational, Scientist, product/process development, or vocational program  Occupational History   Occupation: retired  Tobacco Use   Smoking status: Former    Current packs/day: 0.00    Types: Cigarettes    Start date: 12/10/1956    Quit date: 12/10/1976    Years since quitting: 46.9   Smokeless tobacco: Never  Vaping Use   Vaping status: Never Used  Substance and Sexual Activity   Alcohol use: No   Drug use: No   Sexual activity: Not Currently  Other Topics Concern   Not on file  Social History Narrative   Not on file   Social Determinants of Health   Financial Resource Strain: Low Risk  (06/21/2023)    Received from [facility]   Overall Financial Resource Strain (CARDIA)    Difficulty of Paying Living Expenses: Not very hard  Food Insecurity: No Food Insecurity (10/30/2023)   Hunger Vital Sign    Worried About Running Out of Food in the Last Year: Never true    Ran Out of Food in the Last Year: Never true  Transportation Needs: No Transportation Needs (10/30/2023)   PRAPARE - Administrator, Civil Service (Medical): No    Lack of Transportation (Non-Medical): No  Physical Activity: Insufficiently Active (09/15/2021)   Exercise Vital Sign    Days of Exercise per Week: 7 days    Minutes of Exercise per Session: 20 min  Stress: No Stress Concern Present (09/15/2021)   [employer] of Occupational Health - Occupational Stress Questionnaire    Feeling of Stress : Not at all  Social Connections: Moderately Isolated (09/15/2021)   Social Connection  and Isolation Panel [NHANES]    Frequency of Communication with Friends and Family: Once a week    Frequency of Social Gatherings with Friends and Family: Once a week    Attends Religious Services: More than 4 times per year    Active Member of Clubs or Organizations: No    Attends Banker Meetings: Never    Marital Status: Married  Catering manager Violence: Not At Risk (10/30/2023)   Humiliation, Afraid, Rape, and Kick questionnaire    Fear of Current or Ex-Partner: No    Emotionally Abused: No    Physically Abused: No    Sexually Abused: No    Family History  Problem Relation Age of Onset   Arthritis Mother    Stroke Maternal Grandfather    Breast cancer Neg Hx      Vitals:   11/19/23 0555 11/19/23 1004 11/19/23 1005 11/19/23 1047  BP: (!) 178/70 (!) 121/92  130/88  Pulse: (!) 59 (!) 58  60  Resp: 18   18  Temp: 97.9 F (36.6 C) 98 F (36.7 C) 98 F (36.7 C) 98.4 F (36.9 C)  TempSrc: Oral   Oral  SpO2: 100% 98%  98%  Weight:      Height:        PHYSICAL EXAM General: Well  appearing male, well nourished, in no acute distress resting comfortably in ED stretcher. HEENT: Normocephalic and atraumatic. Neck: No JVD.  Lungs: Normal respiratory effort on room air. Clear bilaterally to auscultation. No wheezes, crackles, rhonchi.  Heart: HRRR. Normal S1 and S2 without gallops or murmurs.  Abdomen: Non-distended appearing.  Msk: Normal strength and tone for age. Extremities: Warm and well perfused. No clubbing, cyanosis. No edema.  Neuro: Alert and oriented X 3. Psych: Answers questions appropriately.   Labs: Basic Metabolic Panel: Recent Labs    11/19/23 0303  NA 134*  K 4.0  CL 100  CO2 20*  GLUCOSE 259*  BUN 83*  CREATININE 3.25*  CALCIUM 8.7*   Liver Function Tests: No results for input(s): "AST", "ALT", "ALKPHOS", "BILITOT", "PROT", "ALBUMIN" in the last 72 hours. No results for input(s): "LIPASE", "AMYLASE" in the last 72 hours. CBC: Recent Labs    11/19/23 0303  WBC 8.7  HGB 12.0*  HCT 36.2*  MCV 94.0  PLT 271   Cardiac Enzymes: Recent Labs    11/19/23 0303 11/19/23 0558  TROPONINIHS 30* 29*   BNP: No results for input(s): "BNP" in the last 72 hours. D-Dimer: No results for input(s): "DDIMER" in the last 72 hours. Hemoglobin A1C: No results for input(s): "HGBA1C" in the last 72 hours. Fasting Lipid Panel: No results for input(s): "CHOL", "HDL", "LDLCALC", "TRIG", "CHOLHDL", "LDLDIRECT" in the last 72 hours. Thyroid Function Tests: No results for input(s): "TSH", "T4TOTAL", "T3FREE", "THYROIDAB" in the last 72 hours.  Invalid input(s): "FREET3" Anemia Panel: No results for input(s): "VITAMINB12", "FOLATE", "FERRITIN", "TIBC", "IRON", "RETICCTPCT" in the last 72 hours.   Radiology: DG Chest 2 View  Result Date: 11/19/2023 CLINICAL DATA:  Chest pain for several hours, initial encounter EXAM: CHEST - 2 VIEW COMPARISON:  10/30/2023 FINDINGS: The heart size and mediastinal contours are within normal limits. Both lungs are clear.  The visualized skeletal structures are unremarkable. IMPRESSION: No active cardiopulmonary disease. Electronically Signed   By: Alcide Clever M.D.   On: 11/19/2023 03:23   US RENAL  Result Date: 10/30/2023 CLINICAL DATA:  Acute kidney injury EXAM: RENAL / URINARY TRACT ULTRASOUND COMPLETE COMPARISON:  None  Available. FINDINGS: Right Kidney: Renal measurements: 10.7 x 5.2 x 4.7 cm = volume: 136 mL. Echogenic renal parenchyma. 8 x 8 x 8 mm exophytic upper pole cyst. No hydronephrosis. Left Kidney: Renal measurements: 10.1 x 4.9 x 4.8 cm = volume: 124 mL. Echogenic renal parenchyma. No mass or hydronephrosis visualized. Bladder: Appears normal for degree of bladder distention. Other: None. IMPRESSION: Echogenic renal parenchyma, suggesting medical renal disease. 8 mm exophytic right upper pole cyst, benign. No hydronephrosis. Electronically Signed   By: Charline Bills M.D.   On: 10/30/2023 16:49   DG Chest Portable 1 View  Result Date: 10/30/2023 CLINICAL DATA:  82 year old male with sudden onset chest pain, pressure. Similar to 2 previous NSTEMI. EXAM: PORTABLE CHEST 1 VIEW COMPARISON:  Chest radiographs 09/29/2023. FINDINGS: Portable AP upright view at 0449 hours. Stable lung volumes and mediastinal contours. Lower mediastinal lipomatosis demonstrated on CT earlier this year. Subpleural lung scarring and some emphysema also demonstrated on that exam. But Allowing for portable technique the lungs are clear. No pneumothorax or pleural effusion. Visualized tracheal air column is within normal limits. No acute osseous abnormality identified. IMPRESSION: No acute cardiopulmonary abnormality. Electronically Signed   By: Odessa Fleming M.D.   On: 10/30/2023 04:59    ECHO 06/2023: INTERPRETATION ---------------------------------------------------------------    NORMAL LEFT VENTRICULAR SYSTOLIC FUNCTION WITH MILD LVH    NORMAL RIGHT VENTRICULAR SYSTOLIC FUNCTION    VALVULAR REGURGITATION: TRIVIAL AR, TRIVIAL MR,  TRIVIAL TR    VALVULAR STENOSIS: MODERATE AS    INSUFFICIENT TR TO ESTIMATE RVSP    MODERATE AS    NO PRIOR STUDY FOR COMPARISON   TELEMETRY reviewed by me 11/19/2023: sinus bradycardia/sinus rhythm rate 50-60s  EKG reviewed by me: NSR rate 61 bpm, nonischemic  Data reviewed by me 11/19/2023: last 24h vitals tele labs imaging I/O ED provider note, admission H&P  Active Problems:   * No active hospital problems. *    ASSESSMENT AND PLAN:  Mike Decker is a 82 y.o. male  with a past medical history of CAD s/p PCI w/ DES RCA, prox LCx, jailed OM 2 but good flow, HFpEF (EF 55-60% 08/2022), mild to moderate AS (mean grad 19 mmHg), CKD 4, type 2 diabetes, carotid stenosis, COPD, OSA on CPAP, Barrett's esophagus, history of prostate cancer who presented to the ED on 11/19/2023 for chest pain. Cardiology was consulted for further evaluation.   # Chest pain # Coronary artery disease s/p multiple prior stents Patient presenting with CP which woke him up around 1 AM, did not improve with NTG. Troponins 30 > 29. EKG stable from prior, nonacute.  -Continue prn nitropaste -Continue metoprolol 50 mg twice daily (previously did not tolerate higher dose due to dizziness), imdur 120 mg twice daily. Continue ranexa 1000 mg twice daily.  -Continue lipitor 80 mg daily, plavix 75 mg daily.  -Will set patient up for outpatient cardiac PET/CT stress for additional evaluation. These imaging studies are currently only done on outpatient basis at [facility], patient expressed understanding. Other imaging modalities limited by renal insufficiency.    # Chronic HFpEF # Moderate aortic stenosis Appears euvolemic on exam. CXR without evidence of edema.  -Continue home meds as prescribed.  # Paroxysmal atrial fibrillation Patient with AF not on anticoagulation due to bleeding issues. Remaining in NSR on tele.  -See plan above for metoprolol.   # CKD IV Patient with chronic kidney disease with baseline Cr  2.5-3.0, now with Cr 3.25 on presentation.  -Continue close follow up  with nephrology.  Ok for discharge today from a cardiac perspective. Will arrange for follow up in clinic with Dr. Juliann Pares in 1-2 weeks.    This patient's plan of care was discussed and created with Dr. Juliann Pares and he is in agreement.  Signed: Gale Journey, PA-C  11/19/2023, 10:56 AM [facility] Cardiology

## 2023-11-19 NOTE — ED Triage Notes (Signed)
Pt to ED via [facility] c/o substernal CP that is pressure like. Pain started around 1am. Pt has hx of 4 NSTEMIs, 11/20 was the last one. Pt says that this pain feels the same as then. Pt took 3 nitroglycerin before EMS arrival with no relief and EMS gave 324mg  aspirin. Denies N/V/D   164/58 75 100%RA

## 2023-11-19 NOTE — Discharge Instructions (Signed)
Please continue with your current medications.  Set up a follow-up with Dr. Juliann Pares, his office is also scheduling additional outpatient testing  Return to the ER right away if you have severe worsening pain, difficulty breathing, fever, or other new concerns arise.

## 2023-11-25 ENCOUNTER — Encounter: Payer: Medicare Other | Attending: Internal Medicine | Admitting: *Deleted

## 2023-11-25 DIAGNOSIS — I214 Non-ST elevation (NSTEMI) myocardial infarction: Secondary | ICD-10-CM | POA: Diagnosis not present

## 2023-11-25 DIAGNOSIS — I700 Atherosclerosis of aorta: Secondary | ICD-10-CM | POA: Diagnosis not present

## 2023-11-25 DIAGNOSIS — I10 Essential (primary) hypertension: Secondary | ICD-10-CM | POA: Diagnosis not present

## 2023-11-25 DIAGNOSIS — I1310 Hypertensive heart and chronic kidney disease without heart failure, with stage 1 through stage 4 chronic kidney disease, or unspecified chronic kidney disease: Secondary | ICD-10-CM | POA: Diagnosis not present

## 2023-11-25 DIAGNOSIS — N179 Acute kidney failure, unspecified: Secondary | ICD-10-CM | POA: Diagnosis not present

## 2023-11-25 DIAGNOSIS — J449 Chronic obstructive pulmonary disease, unspecified: Secondary | ICD-10-CM | POA: Diagnosis not present

## 2023-11-25 DIAGNOSIS — I5082 Biventricular heart failure: Secondary | ICD-10-CM | POA: Diagnosis not present

## 2023-11-25 DIAGNOSIS — N189 Chronic kidney disease, unspecified: Secondary | ICD-10-CM | POA: Diagnosis not present

## 2023-11-25 DIAGNOSIS — N184 Chronic kidney disease, stage 4 (severe): Secondary | ICD-10-CM | POA: Diagnosis not present

## 2023-11-25 NOTE — Progress Notes (Signed)
Daily Session Note  Patient Details  Name: Mike Decker MRN: [id number] Date of Birth: 06-25-1941 Referring Provider:   Flowsheet Row Cardiac Rehab from 08/21/2023 in [facility] Cardiac and Pulmonary Rehab  Referring Provider [facility]       Encounter Date: 11/25/2023  Check In:  Session Check In - 11/25/23 1050       Check-In   Supervising physician immediately available to respond to emergencies See telemetry face sheet for immediately available ER MD    Location [facility]-Cardiac & Pulmonary Rehab    Staff Present Rory Percy, MS, Exercise Physiologist;Maxon Conetta BS, , Exercise Physiologist;Kelly Madilyn Fireman, BS, ACSM CEP, Exercise Physiologist;Dashanae Longfield Katrinka Blazing, RN, ADN    Virtual Visit No    Medication changes reported     Yes    Comments d/c: colchicine, folic acid, sodium bicarbonate. Started Hydralazine 25 mg BID, Nitropaste QD. Change: Torsemide 20 mg take 2 tab QD, Novolog incr to 14 units in AM, 30 at lunch, 30 at dinner, Lantus 60 units nightly, Imdur 120 mg QD.    Fall or balance concerns reported    No    Warm-up and Cool-down Performed on first and last piece of equipment    Resistance Training Performed Yes    VAD Patient? No    PAD/SET Patient? No      Pain Assessment   Currently in Pain? No/denies                Social History   Tobacco Use  Smoking Status Former   Current packs/day: 0.00   Types: Cigarettes   Start date: 12/10/1956   Quit date: 12/10/1976   Years since quitting: 46.9  Smokeless Tobacco Never    Goals Met:  Independence with exercise equipment Exercise tolerated well No report of concerns or symptoms today Strength training completed today  Goals Unmet:  Not Applicable  Comments: Pt able to follow exercise prescription today without complaint.  Will continue to monitor for progression.    Dr. Bethann Punches is Medical Director for [facility] Cardiac Rehabilitation.  Dr. Vida Rigger is Medical Director for [facility] Pulmonary  Rehabilitation.

## 2023-11-27 ENCOUNTER — Encounter: Payer: Self-pay | Admitting: *Deleted

## 2023-11-27 ENCOUNTER — Encounter: Payer: Medicare Other | Admitting: *Deleted

## 2023-11-27 DIAGNOSIS — I214 Non-ST elevation (NSTEMI) myocardial infarction: Secondary | ICD-10-CM

## 2023-11-27 NOTE — Progress Notes (Signed)
Cardiac Individual Treatment Plan  Patient Details  Name: Mike Decker MRN: [id number] Date of Birth: Jun 24, 1941 Referring Provider:   Flowsheet Row Cardiac Rehab from 08/21/2023 in [facility] Cardiac and Pulmonary Rehab  Referring Provider [facility]       Initial Encounter Date:  Flowsheet Row Cardiac Rehab from 08/21/2023 in [facility] Cardiac and Pulmonary Rehab  Date 08/21/23       Visit Diagnosis: NSTEMI (non-ST elevation myocardial infarction) [facility])  Patient's Home Medications on Admission:  Current Outpatient Medications:    acetaminophen (TYLENOL) 500 MG tablet, Take 1,000 mg by mouth every 8 (eight) hours as needed for mild pain., Disp: , Rfl:    atorvastatin (LIPITOR) 80 MG tablet, Take 1 tablet (80 mg total) by mouth daily., Disp: 30 tablet, Rfl: 0   azelastine (ASTELIN) 0.1 % nasal spray, USE 1 TO 2 SPRAYS IN EACH NOSTRIL TWICE A DAY, Disp: , Rfl:    calcitRIOL (ROCALTROL) 0.25 MCG capsule, Take 0.25 mcg by mouth daily., Disp: , Rfl:    cetirizine (ZYRTEC) 5 MG tablet, Take 5 mg by mouth daily., Disp: , Rfl:    citalopram (CELEXA) 10 MG tablet, Take 1 tablet (10 mg total) by mouth daily., Disp: 90 tablet, Rfl: 3   clopidogrel (PLAVIX) 75 MG tablet, TAKE 1 TABLET BY MOUTH EVERY DAY, Disp: 90 tablet, Rfl: 1   colchicine 0.6 MG tablet, Take 3 tablets (1.8 mg total) by mouth daily as needed (gout flares)., Disp: , Rfl:    [employer] VITAMIN B12 1000 MCG tablet, TAKE 1 TABLET BY MOUTH EVERY DAY, Disp: 30 tablet, Rfl: 0   EPINEPHrine (EPI-PEN) 0.3 mg/0.3 mL SOAJ injection, Inject into the muscle as needed (anaphylaxis)., Disp: , Rfl:    ergocalciferol (VITAMIN D2) 1.25 MG (50000 UT) capsule, Take 50,000 Units by mouth every 30 (thirty) days., Disp: , Rfl:    ferrous sulfate 325 (65 FE) MG EC tablet, TAKE 1 TABLET BY MOUTH EVERY DAY, Disp: 90 tablet, Rfl: 3   fluticasone (FLONASE) 50 MCG/ACT nasal spray, USE 1 SPRAY IN EACH NOSTRIL EVERY DAY, Disp: 32 g, Rfl: 6   folic acid (FOLVITE) 400  MCG tablet, Take 400 mcg by mouth daily., Disp: , Rfl:    insulin aspart (NOVOLOG) 100 UNIT/ML FlexPen, 10 UNITS IN AM, 30 UNITS AT LUNCH, 30 UNITS AT DINNER, Disp: , Rfl:    insulin glargine (LANTUS) 100 UNIT/ML injection, Inject 0.35 mLs (35 Units total) into the skin at bedtime. This is a decrease from your previous 45 units nightly. (Patient taking differently: Inject 54 Units into the skin at bedtime.), Disp: 10 mL, Rfl: 11   isosorbide mononitrate (IMDUR) 120 MG 24 hr tablet, Take 1 tablet (120 mg total) by mouth 2 (two) times daily., Disp: 60 tablet, Rfl: 0   JARDIANCE 10 MG TABS tablet, Take 10 mg by mouth daily., Disp: , Rfl:    magnesium oxide (MAG-OX) 400 (240 Mg) MG tablet, Take 1 tablet by mouth daily., Disp: , Rfl:    metoprolol succinate (TOPROL-XL) 50 MG 24 hr tablet, Take 1 tablet by mouth daily., Disp: , Rfl:    mometasone (ASMANEX) 220 MCG/ACT inhaler, Inhale 2 puffs into the lungs daily., Disp: , Rfl:    montelukast (SINGULAIR) 10 MG tablet, TAKE 1 TABLET AT BEDTIME, Disp: 90 tablet, Rfl: 3   nitroGLYCERIN (NITROSTAT) 0.4 MG SL tablet, Place 0.4 mg under the tongue every 5 (five) minutes x 3 doses as needed for chest pain., Disp: , Rfl:    pantoprazole (  PROTONIX) 40 MG tablet, Take 1 tablet (40 mg total) by mouth 2 (two) times daily before a meal., Disp: , Rfl:    ranolazine (RANEXA) 1000 MG SR tablet, Take 1 tablet (1,000 mg total) by mouth daily., Disp: 30 tablet, Rfl: 0   sodium bicarbonate 650 MG tablet, Take 650 mg by mouth 2 (two) times daily., Disp: , Rfl:    sodium fluoride (DENTA 5000 PLUS) 1.1 % CREA dental cream, See admin instructions., Disp: , Rfl:    Tiotropium Bromide-Olodaterol (STIOLTO RESPIMAT) 2.5-2.5 MCG/ACT AERS, Inhale 1 each into the lungs daily., Disp: , Rfl:    torsemide (DEMADEX) 20 MG tablet, Take 1 tablet (20 mg total) by mouth daily. Increase to 1 tablet (20 mg total) by mouth TWICE daily (total daily dose 40 mg) as needed for up to 3 days for increased  leg swelling, shortness of breath, weight gain 5+ lbs over 1-2 days. 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, esphageal   Chronic diastolic CHF (congestive heart failure) (HCC)    Colon polyp    Diabetes mellitus without complication (HCC)    Diverticulosis    Gout    Heart disease    Hemangioma    liver   Hyperlipidemia    Hypertension    Myocardial infarct (HCC)    Ocular hypertension    Peripheral vascular disease (HCC)    Skin cancer    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HCC)    Sleep apnea    Vitreoretinal degeneration     Tobacco Use: Social History   Tobacco Use  Smoking Status Former   Current packs/day: 0.00   Types: Cigarettes   Start date: 12/10/1956   Quit date: 12/10/1976   Years since quitting: 46.9  Smokeless Tobacco Never    Labs: Review Flowsheet  More data exists      Latest Ref Rng & Units 07/18/2021 08/15/2022 08/16/2022 04/19/2023 10/31/2023  Labs for ITP Cardiac and Pulmonary Rehab  Cholestrol 0 - 200 mg/dL - - 213  - -  LDL (calc) 0 - 99 mg/dL - - 33  - -  HDL-C >08 mg/dL - - 17  - -  Trlycerides <150 mg/dL - - 657  - -  Hemoglobin A1c 4.8 - 5.6 % 8.3     7.5  - 6.2  7.1     Details       This result is from an external source.          Exercise Target Goals: Exercise Program Goal: Individual exercise prescription set using results from initial 6 min walk test and THRR while considering  patient's activity barriers and safety.   Exercise Prescription Goal: Initial exercise prescription builds to 30-45 minutes a day of aerobic activity, 2-3 days per week.  Home exercise guidelines will be given to patient during program as part of exercise prescription that the participant will  acknowledge.   Education: Aerobic Exercise: - Group verbal and visual presentation on the components of exercise prescription. Introduces F.I.T.T principle from ACSM for exercise prescriptions.  Reviews F.I.T.T. principles of aerobic exercise including progression. Written material given at graduation. Flowsheet Row Cardiac Rehab from 08/21/2023 in [facility] Cardiac and Pulmonary  Rehab  Education need identified 08/21/23       Education: Resistance Exercise: - Group verbal and visual presentation on the components of exercise prescription. Introduces F.I.T.T principle from ACSM for exercise prescriptions  Reviews F.I.T.T. principles of resistance exercise including progression. Written material given at graduation.    Education: Exercise & Equipment Safety: - Individual verbal instruction and demonstration of equipment use and safety with use of the equipment. Flowsheet Row Cardiac Rehab from 08/21/2023 in [facility] Cardiac and Pulmonary Rehab  Date 08/21/23  Educator MB  Instruction Review Code 1- Verbalizes Understanding       Education: Exercise Physiology & General Exercise Guidelines: - Group verbal and written instruction with models to review the exercise physiology of the cardiovascular system and associated critical values. Provides general exercise guidelines with specific guidelines to those with heart or lung disease.    Education: Flexibility, Balance, Mind/Body Relaxation: - Group verbal and visual presentation with interactive activity on the components of exercise prescription. Introduces F.I.T.T principle from ACSM for exercise prescriptions. Reviews F.I.T.T. principles of flexibility and balance exercise training including progression. Also discusses the mind body connection.  Reviews various relaxation techniques to help reduce and manage stress (i.e. Deep breathing, progressive muscle relaxation, and visualization). Balance handout provided to take home. Written material given  at graduation.   Activity Barriers & Risk Stratification:  Activity Barriers & Cardiac Risk Stratification - 08/21/23 1552       Activity Barriers & Cardiac Risk Stratification   Activity Barriers Back Problems;Other (comment);Muscular Weakness    Comments Gout flares occasionally; back problems, but tolerates    Cardiac Risk Stratification High             6 Minute Walk:  6 Minute Walk     Row Name 08/21/23 1551         6 Minute Walk   Phase Initial     Distance 890 feet     Walk Time 6 minutes     # of Rest Breaks 0     MPH 1.69     METS 1.43     RPE 13     Perceived Dyspnea  1     VO2 Peak 4.99     Symptoms Yes (comment)     Comments Fatigue in legs     Resting HR 69 bpm     Resting BP 140/70     Resting Oxygen Saturation  97 %     Exercise Oxygen Saturation  during 6 min walk 99 %     Max Ex. HR 104 bpm     Max Ex. BP 142/58     2 Minute Post BP 140/66              Oxygen Initial Assessment:   Oxygen Re-Evaluation:   Oxygen Discharge (Final Oxygen Re-Evaluation):   Initial Exercise Prescription:  Initial Exercise Prescription - 08/21/23 1500       Date of Initial Exercise RX and Referring Provider   Date 08/21/23    Referring Provider [facility]      Oxygen   Maintain Oxygen Saturation 88% or higher      Treadmill   MPH 1.4    Grade 0    Minutes 15    METs 2.07      Recumbant Bike   Level 1    RPM 50    Watts 15    Minutes 15    METs 1.43      NuStep  Level 1   T4 and T6   SPM 80    Minutes 15    METs 1.43      T5 Nustep   Level 1    SPM 80    Minutes 15    METs 1.43      Prescription Details   Frequency (times per week) 3    Duration Progress to 30 minutes of continuous aerobic without signs/symptoms of physical distress      Intensity   THRR 40-80% of Max Heartrate 96-123    Ratings of Perceived Exertion 11-13    Perceived Dyspnea 0-4      Progression   Progression Continue to progress workloads to maintain  intensity without signs/symptoms of physical distress.      Resistance Training   Training Prescription Yes    Weight 4 lb    Reps 10-15             Perform Capillary Blood Glucose checks as needed.  Exercise Prescription Changes:   Exercise Prescription Changes     Row Name 08/21/23 1500 09/12/23 1600 09/25/23 1300 10/10/23 1500 10/22/23 1400     Response to Exercise   Blood Pressure (Admit) 140/70 110/68 142/84 130/58 132/52   Blood Pressure (Exercise) 142/58 180/62 164/62 142/72 158/62   Blood Pressure (Exit) 140/66 142/60 112/58 118/68 140/64   Heart Rate (Admit) 69 bpm 72 bpm 108 bpm 76 bpm 79 bpm   Heart Rate (Exercise) 104 bpm 105 bpm 108 bpm 100 bpm 111 bpm   Heart Rate (Exit) 72 bpm 84 bpm 83 bpm 74 bpm 81 bpm   Oxygen Saturation (Admit) 97 % -- -- -- --   Oxygen Saturation (Exercise) 99 % -- -- -- --   Oxygen Saturation (Exit) 97 % -- -- -- --   Rating of Perceived Exertion (Exercise) 13 13 15 15 15    Perceived Dyspnea (Exercise) 1 0 0 0 0   Symptoms fatigue in legs CP 3/4- resolved with rest none none none   Comments results -- -- -- --   Duration Progress to 30 minutes of  aerobic without signs/symptoms of physical distress Progress to 30 minutes of  aerobic without signs/symptoms of physical distress Progress to 30 minutes of  aerobic without signs/symptoms of physical distress Progress to 30 minutes of  aerobic without signs/symptoms of physical distress Progress to 30 minutes of  aerobic without signs/symptoms of physical distress   Intensity THRR New THRR unchanged THRR unchanged THRR unchanged THRR unchanged     Progression   Progression Continue to progress workloads to maintain intensity without signs/symptoms of physical distress. Continue to progress workloads to maintain intensity without signs/symptoms of physical distress. Continue to progress workloads to maintain intensity without signs/symptoms of physical distress. Continue to progress workloads  to maintain intensity without signs/symptoms of physical distress. Continue to progress workloads to maintain intensity without signs/symptoms of physical distress.   Average METs 1.43 2.3 2.3 1.72 1.9     Resistance Training   Training Prescription -- Yes Yes Yes Yes   Weight -- 4 lb 4 lb 4 lb 4 lb   Reps -- 10-15 10-15 10-15 10-15     Interval Training   Interval Training -- No No No No     Treadmill   MPH -- 1.3 -- 1.4 --   Grade -- 0 -- 0 --   Minutes -- 15 -- 15 --   METs -- 1.99 -- 2.07 --     Recumbant  Bike   Level -- 1 4 -- 3   Watts -- 25 19 -- 19   Minutes -- 15 15 -- 15   METs -- 2.91 2.68 -- 2.68     NuStep   Level -- 4 4 -- --   Minutes -- 15 15 -- --   METs -- -- 2.2 -- --     Arm Ergometer   Level -- -- -- 1.2 1.2   Minutes -- -- -- 15 15   METs -- -- -- 1 1.8     T5 Nustep   Level -- 3 4 4 4    Minutes -- 15 15 15 15    METs -- 2.1 2.1 2.1 2     Biostep-RELP   Level -- -- -- -- 3   Minutes -- -- -- -- 15   METs -- -- -- -- 2     Track   Laps -- -- -- -- 16   Minutes -- -- -- -- 15   METs -- -- -- -- 1.87     Oxygen   Maintain Oxygen Saturation -- 88% or higher 88% or higher 88% or higher 88% or higher    Row Name 10/23/23 1100 11/06/23 0900           Response to Exercise   Blood Pressure (Admit) -- 142/82      Blood Pressure (Exit) -- 110/60      Heart Rate (Admit) -- 76 bpm      Heart Rate (Exercise) -- 105 bpm      Heart Rate (Exit) -- 73 bpm      Rating of Perceived Exertion (Exercise) -- 15      Symptoms -- none      Duration -- Progress to 30 minutes of  aerobic without signs/symptoms of physical distress      Intensity -- THRR unchanged        Progression   Progression -- Continue to progress workloads to maintain intensity without signs/symptoms of physical distress.      Average METs -- 1.99        Resistance Training   Training Prescription -- Yes      Weight -- 4 lb      Reps -- 10-15        Interval Training    Interval Training -- No        Recumbant Bike   Level -- 3      Watts -- 20      Minutes -- 15      METs -- 2.71        Arm Ergometer   Level -- 1.2      Minutes -- 15      METs -- 1        T5 Nustep   Level -- 3      Minutes -- 15      METs -- 2        Track   Laps -- 16      Minutes -- 15      METs -- 1.87        Home Exercise Plan   Plans to continue exercise at Home (comment)  walking around his property and exercises from packet at home Home (comment)  walking around his property and exercises from packet at home      Frequency Add 1 additional day to program exercise sessions. Add 1 additional day to program exercise sessions.  Initial Home Exercises Provided 10/23/23 10/23/23        Oxygen   Maintain Oxygen Saturation -- 88% or higher               Exercise Comments:   Exercise Comments     Row Name 08/26/23 1130 10/09/23 1241         Exercise Comments First full day of exercise!  Patient was oriented to gym and equipment including functions, settings, policies, and procedures.  Patient's individual exercise prescription and treatment plan were reviewed.  All starting workloads were established based on the results of the 6 minute walk test done at initial orientation visit.  The plan for exercise progression was also introduced and progression will be customized based on patient's performance and goals. Recent hospitalization for angina.  Meds increased to pre NSTEMI level.               Exercise Goals and Review:   Exercise Goals     Row Name 08/21/23 1556             Exercise Goals   Increase Physical Activity Yes       Intervention Provide advice, education, support and counseling about physical activity/exercise needs.;Develop an individualized exercise prescription for aerobic and resistive training based on initial evaluation findings, risk stratification, comorbidities and participant's personal goals.       Expected Outcomes Short  Term: Attend rehab on a regular basis to increase amount of physical activity.;Long Term: Add in home exercise to make exercise part of routine and to increase amount of physical activity.;Long Term: Exercising regularly at least 3-5 days a week.       Increase Strength and Stamina Yes       Intervention Provide advice, education, support and counseling about physical activity/exercise needs.;Develop an individualized exercise prescription for aerobic and resistive training based on initial evaluation findings, risk stratification, comorbidities and participant's personal goals.       Expected Outcomes Short Term: Increase workloads from initial exercise prescription for resistance, speed, and METs.;Short Term: Perform resistance training exercises routinely during rehab and add in resistance training at home;Long Term: Improve cardiorespiratory fitness, muscular endurance and strength as measured by increased METs and functional capacity ( )       Able to understand and use rate of perceived exertion (RPE) scale Yes       Intervention Provide education and explanation on how to use RPE scale       Expected Outcomes Short Term: Able to use RPE daily in rehab to express subjective intensity level;Long Term:  Able to use RPE to guide intensity level when exercising independently       Able to understand and use Dyspnea scale Yes       Intervention Provide education and explanation on how to use Dyspnea scale       Expected Outcomes Short Term: Able to use Dyspnea scale daily in rehab to express subjective sense of shortness of breath during exertion;Long Term: Able to use Dyspnea scale to guide intensity level when exercising independently       Knowledge and understanding of Target Heart Rate Range (THRR) Yes       Intervention Provide education and explanation of THRR including how the numbers were predicted and where they are located for reference       Expected Outcomes Short Term: Able to  state/look up THRR;Short Term: Able to use daily as guideline for intensity in rehab;Long Term: Able to use THRR  to govern intensity when exercising independently       Able to check pulse independently Yes       Intervention Provide education and demonstration on how to check pulse in carotid and radial arteries.;Review the importance of being able to check your own pulse for safety during independent exercise       Expected Outcomes Short Term: Able to explain why pulse checking is important during independent exercise;Long Term: Able to check pulse independently and accurately       Understanding of Exercise Prescription Yes       Intervention Provide education, explanation, and written materials on patient's individual exercise prescription       Expected Outcomes Short Term: Able to explain program exercise prescription;Long Term: Able to explain home exercise prescription to exercise independently                Exercise Goals Re-Evaluation :  Exercise Goals Re-Evaluation     Row Name 08/26/23 1130 09/12/23 1648 09/25/23 1401 10/10/23 1539 10/10/23 1540     Exercise Goal Re-Evaluation   Exercise Goals Review Able to understand and use rate of perceived exertion (RPE) scale;Able to understand and use Dyspnea scale;Knowledge and understanding of Target Heart Rate Range (THRR);Understanding of Exercise Prescription Increase Physical Activity;Increase Strength and Stamina;Understanding of Exercise Prescription Increase Physical Activity;Increase Strength and Stamina;Understanding of Exercise Prescription Increase Physical Activity;Increase Strength and Stamina;Understanding of Exercise Prescription Increase Physical Activity;Increase Strength and Stamina;Understanding of Exercise Prescription   Comments Reviewed RPE  and dyspnea scale, THR and program prescription with pt today.  Pt voiced understanding and was given a copy of goals to take home. Mike Decker is off to a great start in the program.  He has attended his first 5 session during this review. He has already been able to increase his level on the T4 nustep from 1 to 4. We will continue to monitor his progress in the program. Mike Decker continues to do well in rehab. He has recently been able to signifigantly increase his level on the recumbent bike from level 1 to 4. He has also been able to maintain a consistent intensity on both the T4 and T5 nustep. We will continue to monitor his progress in the program. Mike Decker continues to do well in rehab. Mike Decker continues to do well in rehab. He was only able to attend 3 sessions during this review. In those 3 sessions he was able to maintain a consistent workload. We will continue to monitor his progress in the program.   Expected Outcomes Short: Use RPE daily to regulate intensity. Long: Follow program prescription in THR. Short: Continue to follow current exercise prescription, and progressively increase workloads. Long: Continue exercise to improve strength and stamina. Short: Continue to follow current exercise prescription, and progressively increase workloads. Long: Continue exercise to improve strength and stamina. -- Short: Continue to follow current exercise prescription, and progressively increase workloads. Long: Continue exercise to improve strength and stamina.    Row Name 10/22/23 1500 10/23/23 1152 11/06/23 0948 11/19/23 1534       Exercise Goal Re-Evaluation   Exercise Goals Review Increase Physical Activity;Increase Strength and Stamina;Understanding of Exercise Prescription Increase Physical Activity;Able to understand and use rate of perceived exertion (RPE) scale;Knowledge and understanding of Target Heart Rate Range (THRR);Understanding of Exercise Prescription;Increase Strength and Stamina;Able to understand and use Dyspnea scale;Able to check pulse independently Increase Physical Activity;Increase Strength and Stamina;Understanding of Exercise Prescription Increase Physical  Activity;Increase Strength and Stamina;Understanding of Exercise Prescription  Comments Mike Decker continues to do well in rehab. He has been able to maintain a constant workload on the recumbent bike and T5 nustep. He was also able to add the biostep at level 2, and the track at 16 laps, to his current exercise prescription. We will continue to monitor his progress in the program. Reviewed home exercise with pt today.  Pt plans to walk around property and do weight/band exercises from our packet for exercise. Pt plans to add 1 additional day of home exercise. Reviewed THR, pulse, RPE, sign and symptoms, pulse oximetery and when to call 911 or MD.  Also discussed weather considerations and indoor options.  Pt voiced understanding. Mike Decker is doing well in rehab. He continues to walk 16 laps on the track and has stayed consistent with his workloads on the arm crank and recumbent bike. He did see a decrease from level 4 to level 3 on the T5 nustep. We will continue to monitor his progress in the program. Mike Decker has not attended rehab since 11/18. He has been in contact with [place], and we hope he returns when appropriate. We will continue to monitor his progress in the program.    Expected Outcomes Short: Continue to follow current exercise prescription, and progressively increase workloads. Long: Continue exercise to improve strength and stamina. Short: Add 1 additional day of home exercise involving walking and weight/band exercises. Long: Continue to exercise at home independently. Short: Increase back up to level 4 on the T5 nustep. Long: Continue exercise to improve strength and stamina. Short: Return to the program. Long: Continue exercise to improve strength and stamina.             Discharge Exercise Prescription (Final Exercise Prescription Changes):  Exercise Prescription Changes - 11/06/23 0900       Response to Exercise   Blood Pressure (Admit) 142/82    Blood Pressure (Exit) 110/60    Heart Rate  (Admit) 76 bpm    Heart Rate (Exercise) 105 bpm    Heart Rate (Exit) 73 bpm    Rating of Perceived Exertion (Exercise) 15    Symptoms none    Duration Progress to 30 minutes of  aerobic without signs/symptoms of physical distress    Intensity THRR unchanged      Progression   Progression Continue to progress workloads to maintain intensity without signs/symptoms of physical distress.    Average METs 1.99      Resistance Training   Training Prescription Yes    Weight 4 lb    Reps 10-15      Interval Training   Interval Training No      Recumbant Bike   Level 3    Watts 20    Minutes 15    METs 2.71      Arm Ergometer   Level 1.2    Minutes 15    METs 1      T5 Nustep   Level 3    Minutes 15    METs 2      Track   Laps 16    Minutes 15    METs 1.87      Home Exercise Plan   Plans to continue exercise at Home (comment)   walking around his property and exercises from packet at home   Frequency Add 1 additional day to program exercise sessions.    Initial Home Exercises Provided 10/23/23      Oxygen   Maintain Oxygen Saturation 88% or higher  Nutrition:  Target Goals: Understanding of nutrition guidelines, daily intake of sodium 1500mg , cholesterol 200mg , calories 30% from fat and 7% or less from saturated fats, daily to have 5 or more servings of fruits and vegetables.  Education: All About Nutrition: -Group instruction provided by verbal, written material, interactive activities, discussions, models, and posters to present general guidelines for heart healthy nutrition including fat, fiber, MyPlate, the role of sodium in heart healthy nutrition, utilization of the nutrition label, and utilization of this knowledge for meal planning. Follow up email sent as well. Written material given at graduation. Flowsheet Row Cardiac Rehab from 08/21/2023 in [facility] Cardiac and Pulmonary Rehab  Education need identified 08/21/23       Biometrics:  Pre  Biometrics - 08/21/23 1557       Pre Biometrics   Height 5' 6.2" (1.681 m)    Weight 188 lb 9.6 oz (85.5 kg)    Waist Circumference 45.5 inches    Hip Circumference 46.5 inches    Waist to Hip Ratio 0.98 %    BMI (Calculated) 30.27    Single Leg Stand 4 seconds              Nutrition Therapy Plan and Nutrition Goals:  Nutrition Therapy & Goals - 08/21/23 1559       Personal Nutrition Goals   Nutrition Goal Will meet with RD in the next 2 weeks      Intervention Plan   Intervention Prescribe, educate and counsel regarding individualized specific dietary modifications aiming towards targeted core components such as weight, hypertension, lipid management, diabetes, heart failure and other comorbidities.;Nutrition handout(s) given to patient.    Expected Outcomes Short Term Goal: Understand basic principles of dietary content, such as calories, fat, sodium, cholesterol and nutrients.;Short Term Goal: A plan has been developed with personal nutrition goals set during dietitian appointment.;Long Term Goal: Adherence to prescribed nutrition plan.             Nutrition Assessments:  MEDIFICTS Score Key: >=70 Need to make dietary changes  40-70 Heart Healthy Diet <= 40 Therapeutic Level Cholesterol Diet  Flowsheet Row Cardiac Rehab from 08/21/2023 in [facility] Cardiac and Pulmonary Rehab  Picture Your Plate Total Score on Admission 69      Picture Your Plate Scores: <84 Unhealthy dietary pattern with much room for improvement. 41-50 Dietary pattern unlikely to meet recommendations for good health and room for improvement. 51-60 More healthful dietary pattern, with some room for improvement.  >60 Healthy dietary pattern, although there may be some specific behaviors that could be improved.    Nutrition Goals Re-Evaluation:   Nutrition Goals Discharge (Final Nutrition Goals Re-Evaluation):   Psychosocial: Target Goals: Acknowledge presence or absence of significant  depression and/or stress, maximize coping skills, provide positive support system. Participant is able to verbalize types and ability to use techniques and skills needed for reducing stress and depression.   Education: Stress, Anxiety, and Depression - Group verbal and visual presentation to define topics covered.  Reviews how body is impacted by stress, anxiety, and depression.  Also discusses healthy ways to reduce stress and to treat/manage anxiety and depression.  Written material given at graduation.   Education: Sleep Hygiene -Provides group verbal and written instruction about how sleep can affect your health.  Define sleep hygiene, discuss sleep cycles and impact of sleep habits. Review good sleep hygiene tips.    Initial Review & Psychosocial Screening:  Initial Psych Review & Screening - 08/08/23 1447  Initial Review   Current issues with Current Sleep Concerns      Family Dynamics   Good Support System? Yes   wife     Screening Interventions   Interventions To provide support and resources with identified psychosocial needs    Expected Outcomes Short Term goal: Utilizing psychosocial counselor, staff and physician to assist with identification of specific Stressors or current issues interfering with healing process. Setting desired goal for each stressor or current issue identified.;Long Term Goal: Stressors or current issues are controlled or eliminated.;Short Term goal: Identification and review with participant of any Quality of Life or Depression concerns found by scoring the questionnaire.;Long Term goal: The participant improves quality of Life and PHQ9 Scores as seen by post scores and/or verbalization of changes             Quality of Life Scores:   Quality of Life - 08/21/23 1558       Quality of Life   Select Quality of Life      Quality of Life Scores   Health/Function Pre 12.53 %    Socioeconomic Pre 24.58 %    Psych/Spiritual Pre 20.14 %    Family  Pre 25.2 %    GLOBAL Pre 18.26 %            Scores of 19 and below usually indicate a poorer quality of life in these areas.  A difference of  2-3 points is a clinically meaningful difference.  A difference of 2-3 points in the total score of the Quality of Life Index has been associated with significant improvement in overall quality of life, self-image, physical symptoms, and general health in studies assessing change in quality of life.  PHQ-9: Review Flowsheet  More data exists      08/21/2023 10/29/2022 12/18/2021 09/15/2021 06/28/2021  Depression screen PHQ 2/9  Decreased Interest 0 0 0 0 0  Down, Depressed, Hopeless 0 0 0 0 0  PHQ - 2 Score 0 0 0 0 0  Altered sleeping 3 - - - -  Tired, decreased energy 2 - - - -  Change in appetite 0 - - - -  Feeling bad or failure about yourself  0 - - - -  Trouble concentrating 0 - - - -  Moving slowly or fidgety/restless 0 - - - -  Suicidal thoughts 0 - - - -  PHQ-9 Score 5 - - - -  Difficult doing work/chores Not difficult at all - - - -   Interpretation of Total Score  Total Score Depression Severity:  1-4 = Minimal depression, 5-9 = Mild depression, 10-14 = Moderate depression, 15-19 = Moderately severe depression, 20-27 = Severe depression   Psychosocial Evaluation and Intervention:  Psychosocial Evaluation - 08/08/23 1513       Psychosocial Evaluation & Interventions   Interventions Relaxation education;Encouraged to exercise with the program and follow exercise prescription    Comments [place] "Mike Decker" is coming to cardiac rehab post NSTEMI. 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 He has no barriers to attending the program. He wants to improve his strength and become physically  able and comfortable walking around. He has some sleep concerns but stress seems to be under control. He has a  good support system at home with his wife. Mike Decker is looking forward to starting rehab.    Expected Outcomes Short: Attend cardiac rehab for education and exercise.  Long: [place] and maintain positive self care habits    Continue Psychosocial Services  Follow up required by staff             Psychosocial Re-Evaluation:   Psychosocial Discharge (Final Psychosocial Re-Evaluation):   Vocational Rehabilitation: Provide vocational rehab assistance to qualifying candidates.   Vocational Rehab Evaluation & Intervention:  Vocational Rehab - 08/08/23 1505       Initial Vocational Rehab Evaluation & Intervention   Assessment shows need for Vocational Rehabilitation No             Education: Education Goals: Education classes will be provided on a variety of topics geared toward better understanding of heart health and risk factor modification. Participant will state understanding/return demonstration of topics presented as noted by education test scores.  Learning Barriers/Preferences:  Learning Barriers/Preferences - 08/08/23 1452       Learning Barriers/Preferences   Learning Barriers None    Learning Preferences None             General Cardiac Education Topics:  AED/CPR: - Group verbal and written instruction with the use of models to demonstrate the basic use of the AED with the basic ABC's of resuscitation.   Anatomy and Cardiac Procedures: - Group verbal and visual presentation and models provide information about basic cardiac anatomy and function. Reviews the testing methods done to diagnose heart disease and the outcomes of the test results. Describes the treatment choices: Medical Management, Angioplasty, or Coronary Bypass Surgery for treating various heart conditions including Myocardial Infarction, Angina, Valve Disease, and Cardiac Arrhythmias.   Written material given at graduation.   Medication Safety: - Group verbal and visual instruction to review commonly prescribed medications for heart and lung disease. Reviews the medication, class of the drug, and side effects. Includes the steps to properly store meds and maintain the prescription regimen.  Written material given at graduation.   Intimacy: - Group verbal instruction through game format to discuss how heart and lung disease can affect sexual intimacy. Written material given at graduation..   Know Your Numbers and Heart Failure: - Group verbal and visual instruction to discuss disease risk factors for cardiac and pulmonary disease and treatment options.  Reviews associated critical values for Overweight/Obesity, Hypertension, Cholesterol, and Diabetes.  Discusses basics of heart failure: signs/symptoms and treatments.  Introduces Heart Failure Zone chart for action plan for heart failure.  Written material given at graduation.   Infection Prevention: - Provides verbal and written material to individual with discussion of infection control including proper hand washing and proper equipment cleaning during exercise session. Flowsheet Row Cardiac Rehab from 08/21/2023 in [facility] Cardiac and Pulmonary Rehab  Date 08/21/23  Educator MB  Instruction Review Code 1- Verbalizes Understanding       Falls Prevention: - Provides verbal and written material to individual with discussion of falls prevention and safety. Flowsheet Row Cardiac Rehab from 08/21/2023 in [facility] Cardiac and Pulmonary Rehab  Date 08/21/23  Educator MB  Instruction Review Code 1- Verbalizes Understanding       Other: -Provides group and verbal instruction on various topics (see comments)   Knowledge Questionnaire Score:  Knowledge Questionnaire Score - 08/21/23 1603       Knowledge Questionnaire Score   Pre Score 23/26  Core Components/Risk Factors/Patient Goals at Admission:   Personal Goals and Risk Factors at Admission - 08/21/23 1603       Core Components/Risk Factors/Patient Goals on Admission    Weight Management Yes;Weight Loss    Intervention Weight Management: Develop a combined nutrition and exercise program designed to reach desired caloric intake, while maintaining appropriate intake of nutrient and fiber, sodium and fats, and appropriate energy expenditure required for the weight goal.;Weight Management: Provide education and appropriate resources to help participant work on and attain dietary goals.;Weight Management/Obesity: Establish reasonable short term and long term weight goals.    Admit Weight 188 lb 9.6 oz (85.5 kg)    Goal Weight: Short Term 178 lb (80.7 kg)    Goal Weight: Long Term 173 lb (78.5 kg)    Expected Outcomes Short Term: Continue to assess and modify interventions until short term weight is achieved;Long Term: Adherence to nutrition and physical activity/exercise program aimed toward attainment of established weight goal;Weight Maintenance: Understanding of the daily nutrition guidelines, which includes 25-35% calories from fat, 7% or less cal from saturated fats, less than 200mg  cholesterol, less than 1.5gm of sodium, & 5 or more servings of fruits and vegetables daily;Understanding recommendations for meals to include 15-35% energy as protein, 25-35% energy from fat, 35-60% energy from carbohydrates, less than 200mg  of dietary cholesterol, 20-35 gm of total fiber daily;Weight Loss: Understanding of general recommendations for a balanced deficit meal plan, which promotes 1-2 lb weight loss per week and includes a negative energy balance of [phone] kcal/d;Understanding of distribution of calorie intake throughout the day with the consumption of 4-5 meals/snacks    Improve shortness of breath with ADL's Yes    Intervention Provide education, individualized exercise plan and daily activity instruction to help decrease symptoms of SOB with  activities of daily living.    Expected Outcomes Short Term: Improve cardiorespiratory fitness to achieve a reduction of symptoms when performing ADLs;Long Term: Be able to perform more ADLs without symptoms or delay the onset of symptoms    Diabetes Yes    Intervention Provide education about signs/symptoms and action to take for hypo/hyperglycemia.;Provide education about proper nutrition, including hydration, and aerobic/resistive exercise prescription along with prescribed medications to achieve blood glucose in normal ranges: Fasting glucose 65-99 mg/dL    Expected Outcomes Short Term: Participant verbalizes understanding of the signs/symptoms and immediate care of hyper/hypoglycemia, proper foot care and importance of medication, aerobic/resistive exercise and nutrition plan for blood glucose control.;Long Term: Attainment of HbA1C < 7%.    Heart Failure Yes    Intervention Provide a combined exercise and nutrition program that is supplemented with education, support and counseling about heart failure. Directed toward relieving symptoms such as shortness of breath, decreased exercise tolerance, and extremity edema.    Expected Outcomes Improve functional capacity of life;Short term: Attendance in program 2-3 days a week with increased exercise capacity. Reported lower sodium intake. Reported increased fruit and vegetable intake. Reports medication compliance.;Short term: Daily weights obtained and reported for increase. Utilizing diuretic protocols set by physician.;Long term: Adoption of self-care skills and reduction of barriers for early signs and symptoms recognition and intervention leading to self-care maintenance.    Hypertension Yes    Intervention Provide education on lifestyle modifcations including regular physical activity/exercise, weight management, moderate sodium restriction and increased consumption of fresh fruit, vegetables, and low fat dairy, alcohol moderation, and smoking  cessation.;Monitor prescription use compliance.    Expected Outcomes Short Term: Continued assessment and intervention until BP  is < 140/79mm HG in hypertensive participants. < 130/30mm HG in hypertensive participants with diabetes, heart failure or chronic kidney disease.;Long Term: Maintenance of blood pressure at goal levels.    Lipids Yes    Intervention Provide education and support for participant on nutrition & aerobic/resistive exercise along with prescribed medications to achieve LDL 70mg , HDL >40mg .    Expected Outcomes Short Term: Participant states understanding of desired cholesterol values and is compliant with medications prescribed. Participant is following exercise prescription and nutrition guidelines.;Long Term: Cholesterol controlled with medications as prescribed, with individualized exercise RX and with personalized nutrition plan. Value goals: LDL < 70mg , HDL > 40 mg.             Education:Diabetes - Individual verbal and written instruction to review signs/symptoms of diabetes, desired ranges of glucose level fasting, after meals and with exercise. Acknowledge that pre and post exercise glucose checks will be done for 3 sessions at entry of program. Flowsheet Row Cardiac Rehab from 08/21/2023 in [facility] Cardiac and Pulmonary Rehab  Date 08/21/23  Educator MB  Instruction Review Code 1- Verbalizes Understanding       Core Components/Risk Factors/Patient Goals Review:    Core Components/Risk Factors/Patient Goals at Discharge (Final Review):    ITP Comments:  ITP Comments     Row Name 08/08/23 1510 08/21/23 1550 08/26/23 1130 09/11/23 1009 10/09/23 1240   ITP Comments Initial phone call completed. Dx can be found in [facility] 7/12. EP orientation scheduled for 9/11 at 1:30 pm. Completed and gym orientation. Initial ITP created and sent for review to Dr. Daniel Nones. First full day of exercise!  Patient was oriented to gym and equipment including functions, settings,  policies, and procedures.  Patient's individual exercise prescription and treatment plan were reviewed.  All starting workloads were established based on the results of the 6 minute walk test done at initial orientation visit.  The plan for exercise progression was also introduced and progression will be customized based on patient's performance and goals. 30 Day review completed. Medical Director ITP review done, changes made as directed, and signed approval by Medical Director.    new to program 30 Day review completed. Medical Director ITP review done, changes made as directed, and signed approval by Medical Director.    Row Name 10/09/23 1241 10/30/23 1137 11/19/23 1248 11/27/23 0957     ITP Comments Recent hospitalization for angina.  Meds increased to pre NSTEMI level. 30 Day review completed. Medical Director ITP review done, changes made as directed, and signed approval by Medical Director. [name]'s wife called to inform staff that he was in the emergency department last night with chest pain. He is following up with Dr. Juliann Pares and plans to be out the rest of the week. They will call about next week if he needs to be absent. 30 Day review completed. Medical Director ITP review done, changes made as directed, and signed approval by Medical Director.             Comments:

## 2023-11-29 ENCOUNTER — Encounter: Payer: Medicare Other | Admitting: *Deleted

## 2023-11-29 DIAGNOSIS — I214 Non-ST elevation (NSTEMI) myocardial infarction: Secondary | ICD-10-CM | POA: Diagnosis not present

## 2023-11-29 NOTE — Progress Notes (Signed)
Daily Session Note  Patient Details  Name: Mike Decker MRN: [id number] Date of Birth: 1940/12/16 Referring Provider:   Flowsheet Row Cardiac Rehab from 08/21/2023 in [facility] Cardiac and Pulmonary Rehab  Referring Provider [facility]       Encounter Date: 11/29/2023  Check In:  Session Check In - 11/29/23 1125       Check-In   Supervising physician immediately available to respond to emergencies See telemetry face sheet for immediately available ER MD    Location [facility]-Cardiac & Pulmonary Rehab    Staff Present Elige Ko, RCP,RRT,BSRT;Cora Collum, RN, BSN, CCRP;Noah Tickle, BS, Exercise Physiologist    Medication changes reported     No    Fall or balance concerns reported    No    Warm-up and Cool-down Performed on first and last piece of equipment    Resistance Training Performed Yes    VAD Patient? No    PAD/SET Patient? No      Pain Assessment   Currently in Pain? No/denies                Social History   Tobacco Use  Smoking Status Former   Current packs/day: 0.00   Types: Cigarettes   Start date: 12/10/1956   Quit date: 12/10/1976   Years since quitting: 47.0  Smokeless Tobacco Never    Goals Met:  Independence with exercise equipment Exercise tolerated well No report of concerns or symptoms today  Goals Unmet:  Not Applicable  Comments: Pt able to follow exercise prescription today without complaint.  Will continue to monitor for progression.    Dr. Bethann Punches is Medical Director for [facility] Cardiac Rehabilitation.  Dr. Vida Rigger is Medical Director for [facility] Pulmonary Rehabilitation.

## 2023-12-02 ENCOUNTER — Encounter: Payer: Medicare Other | Admitting: *Deleted

## 2023-12-02 DIAGNOSIS — I214 Non-ST elevation (NSTEMI) myocardial infarction: Secondary | ICD-10-CM

## 2023-12-02 NOTE — Progress Notes (Signed)
Daily Session Note  Patient Details  Name: Mike Decker MRN: [id number] Date of Birth: November 01, 1941 Referring Provider:   Flowsheet Row Cardiac Rehab from 08/21/2023 in [facility] Cardiac and Pulmonary Rehab  Referring Provider [facility]       Encounter Date: 12/02/2023  Check In:  Session Check In - 12/02/23 1341       Check-In   Supervising physician immediately available to respond to emergencies See telemetry face sheet for immediately available ER MD    Location [facility]-Cardiac & Pulmonary Rehab    Staff Present Cora Collum, RN, BSN, CCRP;Margaret Best, MS, Exercise Physiologist;Kristen Coble, RN,BC,MSN;Megan Katrinka Blazing, RN, ADN    Virtual Visit No    Medication changes reported     No    Fall or balance concerns reported    No    Warm-up and Cool-down Performed on first and last piece of equipment    Resistance Training Performed Yes    VAD Patient? No    PAD/SET Patient? No      Pain Assessment   Currently in Pain? No/denies                Social History   Tobacco Use  Smoking Status Former   Current packs/day: 0.00   Types: Cigarettes   Start date: 12/10/1956   Quit date: 12/10/1976   Years since quitting: 47.0  Smokeless Tobacco Never    Goals Met:  Independence with exercise equipment Exercise tolerated well Angina symptoms, slowed down for resolution  Goals Unmet:  Not Applicable  Comments: Pt able to follow exercise prescription today without complaint.  Will continue to monitor for progression.    Dr. Bethann Punches is Medical Director for [facility] Cardiac Rehabilitation.  Dr. Vida Rigger is Medical Director for [facility] Pulmonary Rehabilitation.

## 2023-12-16 ENCOUNTER — Encounter: Payer: Medicare Other | Attending: Internal Medicine | Admitting: *Deleted

## 2023-12-16 DIAGNOSIS — I214 Non-ST elevation (NSTEMI) myocardial infarction: Secondary | ICD-10-CM | POA: Insufficient documentation

## 2023-12-18 ENCOUNTER — Encounter: Payer: Medicare Other | Admitting: *Deleted

## 2023-12-18 DIAGNOSIS — I214 Non-ST elevation (NSTEMI) myocardial infarction: Secondary | ICD-10-CM | POA: Diagnosis not present

## 2023-12-18 NOTE — Progress Notes (Signed)
 Daily Session Note  Patient Details  Name: Mike Decker MRN: [id number] Date of Birth: 06-24-1941 Referring Provider:   Flowsheet Row Cardiac Rehab from 08/21/2023 in [facility] Cardiac and Pulmonary Rehab  Referring Provider [facility]       Encounter Date: 12/18/2023  Check In:  Session Check In - 12/18/23 1114       Check-In   Supervising physician immediately available to respond to emergencies See telemetry face sheet for immediately available ER MD    Location [facility]-Cardiac & Pulmonary Rehab    Staff Present Othel Durand, RN, BSN, CCRP;Joseph [place], RCP,RRT,BSRT;Kelly [place], BS, ACSM CEP, Exercise Physiologist;Tzipporah Nagorski Claudene, RN, [place]    Virtual Visit No    Medication changes reported     No    Fall or balance concerns reported    No    Warm-up and Cool-down Performed on first and last piece of equipment    Resistance Training Performed Yes    VAD Patient? No    PAD/SET Patient? No      Pain Assessment   Currently in Pain? No/denies                Social History   Tobacco Use  Smoking Status Former   Current packs/day: 0.00   Types: Cigarettes   Start date: 12/10/1956   Quit date: 12/10/1976   Years since quitting: 47.0  Smokeless Tobacco Never    Goals Met:  Independence with exercise equipment Exercise tolerated well No report of concerns or symptoms today Strength training completed today  Goals Unmet:  Not Applicable  Comments: Pt able to follow exercise prescription today without complaint.  Will continue to monitor for progression.    Dr. Oneil Pinal is Medical Director for [facility] Cardiac Rehabilitation.  Dr. Fuad Aleskerov is Medical Director for [facility] Pulmonary Rehabilitation.

## 2023-12-19 DIAGNOSIS — Z8673 Personal history of transient ischemic attack (TIA), and cerebral infarction without residual deficits: Secondary | ICD-10-CM | POA: Diagnosis not present

## 2023-12-19 DIAGNOSIS — I10 Essential (primary) hypertension: Secondary | ICD-10-CM | POA: Diagnosis not present

## 2023-12-19 DIAGNOSIS — I2510 Atherosclerotic heart disease of native coronary artery without angina pectoris: Secondary | ICD-10-CM | POA: Diagnosis not present

## 2023-12-19 DIAGNOSIS — N184 Chronic kidney disease, stage 4 (severe): Secondary | ICD-10-CM | POA: Diagnosis not present

## 2023-12-19 DIAGNOSIS — N1832 Chronic kidney disease, stage 3b: Secondary | ICD-10-CM | POA: Diagnosis not present

## 2023-12-19 DIAGNOSIS — I214 Non-ST elevation (NSTEMI) myocardial infarction: Secondary | ICD-10-CM | POA: Diagnosis not present

## 2023-12-19 DIAGNOSIS — E782 Mixed hyperlipidemia: Secondary | ICD-10-CM | POA: Diagnosis not present

## 2023-12-19 DIAGNOSIS — R0602 Shortness of breath: Secondary | ICD-10-CM | POA: Diagnosis not present

## 2023-12-19 DIAGNOSIS — I482 Chronic atrial fibrillation, unspecified: Secondary | ICD-10-CM | POA: Diagnosis not present

## 2023-12-19 DIAGNOSIS — R079 Chest pain, unspecified: Secondary | ICD-10-CM | POA: Diagnosis not present

## 2023-12-19 DIAGNOSIS — I5032 Chronic diastolic (congestive) heart failure: Secondary | ICD-10-CM | POA: Diagnosis not present

## 2023-12-19 DIAGNOSIS — I739 Peripheral vascular disease, unspecified: Secondary | ICD-10-CM | POA: Diagnosis not present

## 2023-12-25 ENCOUNTER — Encounter: Payer: Medicare Other | Admitting: *Deleted

## 2023-12-25 ENCOUNTER — Encounter: Payer: Self-pay | Admitting: *Deleted

## 2023-12-25 DIAGNOSIS — I214 Non-ST elevation (NSTEMI) myocardial infarction: Secondary | ICD-10-CM

## 2023-12-25 NOTE — Progress Notes (Signed)
 Cardiac Individual Treatment Plan  Patient Details  Name: Mike Decker MRN: [id number] Date of Birth: June 07, 1941 Referring Provider:   Flowsheet Row Cardiac Rehab from 08/21/2023 in [facility] Cardiac and Pulmonary Rehab  Referring Provider [facility]       Initial Encounter Date:  Flowsheet Row Cardiac Rehab from 08/21/2023 in [facility] Cardiac and Pulmonary Rehab  Date 08/21/23       Visit Diagnosis: NSTEMI (non-ST elevation myocardial infarction) [facility])  Patient's Home Medications on Admission:  Current Outpatient Medications:    acetaminophen  (TYLENOL ) 500 MG tablet, Take 1,000 mg by mouth every 8 (eight) hours as needed for mild pain., Disp: , Rfl:    atorvastatin  (LIPITOR ) 80 MG tablet, Take 1 tablet (80 mg total) by mouth daily., Disp: 30 tablet, Rfl: 0   azelastine  (ASTELIN ) 0.1 % nasal spray, USE 1 TO 2 SPRAYS IN EACH NOSTRIL TWICE A DAY, Disp: , Rfl:    calcitRIOL  (ROCALTROL ) 0.25 MCG capsule, Take 0.25 mcg by mouth daily., Disp: , Rfl:    cetirizine (ZYRTEC) 5 MG tablet, Take 5 mg by mouth daily., Disp: , Rfl:    citalopram  (CELEXA ) 10 MG tablet, Take 1 tablet (10 mg total) by mouth daily., Disp: 90 tablet, Rfl: 3   clopidogrel  (PLAVIX ) 75 MG tablet, TAKE 1 TABLET BY MOUTH EVERY DAY, Disp: 90 tablet, Rfl: 1   colchicine  0.6 MG tablet, Take 3 tablets (1.8 mg total) by mouth daily as needed (gout flares)., Disp: , Rfl:    [employer] VITAMIN B12 1000 MCG tablet, TAKE 1 TABLET BY MOUTH EVERY DAY, Disp: 30 tablet, Rfl: 0   EPINEPHrine  (EPI-PEN) 0.3 mg/0.3 mL SOAJ injection, Inject into the muscle as needed (anaphylaxis)., Disp: , Rfl:    ergocalciferol  (VITAMIN D2) 1.25 MG (50000 UT) capsule, Take 50,000 Units by mouth every 30 (thirty) days., Disp: , Rfl:    ferrous sulfate  325 (65 FE) MG EC tablet, TAKE 1 TABLET BY MOUTH EVERY DAY, Disp: 90 tablet, Rfl: 3   fluticasone  (FLONASE ) 50 MCG/ACT nasal spray, USE 1 SPRAY IN EACH NOSTRIL EVERY DAY, Disp: 32 g, Rfl: 6   folic acid  (FOLVITE ) 400  MCG tablet, Take 400 mcg by mouth daily., Disp: , Rfl:    insulin  aspart (NOVOLOG ) 100 UNIT/ML FlexPen, 10 UNITS IN AM, 30 UNITS AT LUNCH, 30 UNITS AT DINNER, Disp: , Rfl:    insulin  glargine (LANTUS ) 100 UNIT/ML injection, Inject 0.35 mLs (35 Units total) into the skin at bedtime. This is a decrease from your previous 45 units nightly. (Patient taking differently: Inject 54 Units into the skin at bedtime.), Disp: 10 mL, Rfl: 11   isosorbide  mononitrate (IMDUR ) 120 MG 24 hr tablet, Take 1 tablet (120 mg total) by mouth 2 (two) times daily., Disp: 60 tablet, Rfl: 0   JARDIANCE 10 MG TABS tablet, Take 10 mg by mouth daily., Disp: , Rfl:    magnesium  oxide (MAG-OX) 400 (240 Mg) MG tablet, Take 1 tablet by mouth daily., Disp: , Rfl:    metoprolol  succinate (TOPROL -XL) 50 MG 24 hr tablet, Take 1 tablet by mouth daily., Disp: , Rfl:    mometasone  (ASMANEX ) 220 MCG/ACT inhaler, Inhale 2 puffs into the lungs daily., Disp: , Rfl:    montelukast  (SINGULAIR ) 10 MG tablet, TAKE 1 TABLET AT BEDTIME, Disp: 90 tablet, Rfl: 3   nitroGLYCERIN  (NITRODUR - DOSED IN MG/24 HR) 0.4 mg/hr patch, Place 0.4 mg onto the skin daily., Disp: , Rfl:    nitroGLYCERIN  (NITROSTAT ) 0.4 MG SL tablet, Place 0.4  mg under the tongue every 5 (five) minutes x 3 doses as needed for chest pain., Disp: , Rfl:    pantoprazole  (PROTONIX ) 40 MG tablet, Take 1 tablet (40 mg total) by mouth 2 (two) times daily before a meal., Disp: , Rfl:    ranolazine  (RANEXA ) 1000 MG SR tablet, Take 1 tablet (1,000 mg total) by mouth daily., Disp: 30 tablet, Rfl: 0   sodium bicarbonate  650 MG tablet, Take 650 mg by mouth 2 (two) times daily., Disp: , Rfl:    sodium fluoride (DENTA 5000 PLUS) 1.1 % CREA dental cream, See admin instructions., Disp: , Rfl:    Tiotropium Bromide-Olodaterol (STIOLTO RESPIMAT) 2.5-2.5 MCG/ACT AERS, Inhale 1 each into the lungs daily., Disp: , Rfl:    torsemide  (DEMADEX ) 20 MG tablet, Take 1 tablet (20 mg total) by mouth daily.  Increase to 1 tablet (20 mg total) by mouth TWICE daily (total daily dose 40 mg) as needed for up to 3 days for increased leg swelling, shortness of breath, weight gain 5+ lbs over 1-2 days. 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, esphageal   Chronic diastolic CHF (congestive heart failure) (HCC)    Colon polyp    Diabetes mellitus without complication (HCC)    Diverticulosis    Gout    Heart disease    Hemangioma    liver   Hyperlipidemia    Hypertension    Myocardial infarct (HCC)    Ocular hypertension    Peripheral vascular disease (HCC)    Skin cancer    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HCC)    Sleep apnea    Vitreoretinal degeneration     Tobacco Use: Social History   Tobacco Use  Smoking Status Former   Current packs/day: 0.00   Types: Cigarettes   Start date: 12/10/1956   Quit date: 12/10/1976   Years since quitting: 47.0  Smokeless Tobacco Never    Labs: Review Flowsheet  More data exists      Latest Ref Rng & Units 07/18/2021 08/15/2022 08/16/2022 04/19/2023 10/31/2023  Labs for ITP Cardiac and Pulmonary Rehab  Cholestrol 0 - 200 mg/dL - - 161  - -  LDL (calc) 0 - 99 mg/dL - - 33  - -  HDL-C >09 mg/dL - - 17  - -  Trlycerides <150 mg/dL - - 604  - -  Hemoglobin A1c 4.8 - 5.6 % 8.3     7.5  - 6.2  7.1     Details       This result is from an external source.          Exercise Target Goals: Exercise Program Goal: Individual exercise prescription set using results from initial 6 min walk test and THRR while considering  patient's activity barriers and safety.   Exercise Prescription Goal: Initial exercise prescription builds to 30-45 minutes a day of aerobic activity, 2-3 days per week.  Home exercise  guidelines will be given to patient during program as part of exercise prescription that the participant will acknowledge.   Education: Aerobic Exercise: - Group verbal and visual presentation on the components of exercise prescription. Introduces F.I.T.T principle from ACSM for exercise prescriptions.  Reviews  F.I.T.T. principles of aerobic exercise including progression. Written material given at graduation. Flowsheet Row Cardiac Rehab from 08/21/2023 in [facility] Cardiac and Pulmonary Rehab  Education need identified 08/21/23       Education: Resistance Exercise: - Group verbal and visual presentation on the components of exercise prescription. Introduces F.I.T.T principle from ACSM for exercise prescriptions  Reviews F.I.T.T. principles of resistance exercise including progression. Written material given at graduation.    Education: Exercise & Equipment Safety: - Individual verbal instruction and demonstration of equipment use and safety with use of the equipment. Flowsheet Row Cardiac Rehab from 08/21/2023 in [facility] Cardiac and Pulmonary Rehab  Date 08/21/23  Educator MB  Instruction Review Code 1- Verbalizes Understanding       Education: Exercise Physiology & General Exercise Guidelines: - Group verbal and written instruction with models to review the exercise physiology of the cardiovascular system and associated critical values. Provides general exercise guidelines with specific guidelines to those with heart or lung disease.    Education: Flexibility, Balance, Mind/Body Relaxation: - Group verbal and visual presentation with interactive activity on the components of exercise prescription. Introduces F.I.T.T principle from ACSM for exercise prescriptions. Reviews F.I.T.T. principles of flexibility and balance exercise training including progression. Also discusses the mind body connection.  Reviews various relaxation techniques to help reduce and manage stress (i.e. Deep breathing,  progressive muscle relaxation, and visualization). Balance handout provided to take home. Written material given at graduation.   Activity Barriers & Risk Stratification:  Activity Barriers & Cardiac Risk Stratification - 08/21/23 1552       Activity Barriers & Cardiac Risk Stratification   Activity Barriers Back Problems;Other (comment);Muscular Weakness    Comments Gout flares occasionally; back problems, but tolerates    Cardiac Risk Stratification High             6 Minute Walk:  6 Minute Walk     Row Name 08/21/23 1551         6 Minute Walk   Phase Initial     Distance 890 feet     Walk Time 6 minutes     # of Rest Breaks 0     MPH 1.69     METS 1.43     RPE 13     Perceived Dyspnea  1     VO2 Peak 4.99     Symptoms Yes (comment)     Comments Fatigue in legs     Resting HR 69 bpm     Resting BP 140/70     Resting Oxygen Saturation  97 %     Exercise Oxygen Saturation  during 6 min walk 99 %     Max Ex. HR 104 bpm     Max Ex. BP 142/58     2 Minute Post BP 140/66              Oxygen Initial Assessment:   Oxygen Re-Evaluation:   Oxygen Discharge (Final Oxygen Re-Evaluation):   Initial Exercise Prescription:  Initial Exercise Prescription - 08/21/23 1500       Date of Initial Exercise RX and Referring Provider   Date 08/21/23    Referring Provider [facility]      Oxygen   Maintain Oxygen Saturation 88% or higher      Treadmill   MPH 1.4    Grade 0    Minutes 15    METs 2.07      Recumbant Bike   Level 1    RPM 50  Watts 15    Minutes 15    METs 1.43      NuStep   Level 1   T4 and T6   SPM 80    Minutes 15    METs 1.43      T5 Nustep   Level 1    SPM 80    Minutes 15    METs 1.43      Prescription Details   Frequency (times per week) 3    Duration Progress to 30 minutes of continuous aerobic without signs/symptoms of physical distress      Intensity   THRR 40-80% of Max Heartrate 96-123    Ratings of Perceived  Exertion 11-13    Perceived Dyspnea 0-4      Progression   Progression Continue to progress workloads to maintain intensity without signs/symptoms of physical distress.      Resistance Training   Training Prescription Yes    Weight 4 lb    Reps 10-15             Perform Capillary Blood Glucose checks as needed.  Exercise Prescription Changes:   Exercise Prescription Changes     Row Name 08/21/23 1500 09/12/23 1600 09/25/23 1300 10/10/23 1500 10/22/23 1400     Response to Exercise   Blood Pressure (Admit) 140/70 110/68 142/84 130/58 132/52   Blood Pressure (Exercise) 142/58 180/62 164/62 142/72 158/62   Blood Pressure (Exit) 140/66 142/60 112/58 118/68 140/64   Heart Rate (Admit) 69 bpm 72 bpm 108 bpm 76 bpm 79 bpm   Heart Rate (Exercise) 104 bpm 105 bpm 108 bpm 100 bpm 111 bpm   Heart Rate (Exit) 72 bpm 84 bpm 83 bpm 74 bpm 81 bpm   Oxygen Saturation (Admit) 97 % -- -- -- --   Oxygen Saturation (Exercise) 99 % -- -- -- --   Oxygen Saturation (Exit) 97 % -- -- -- --   Rating of Perceived Exertion (Exercise) 13 13 15 15 15    Perceived Dyspnea (Exercise) 1 0 0 0 0   Symptoms fatigue in legs CP 3/4- resolved with rest none none none   Comments results -- -- -- --   Duration Progress to 30 minutes of  aerobic without signs/symptoms of physical distress Progress to 30 minutes of  aerobic without signs/symptoms of physical distress Progress to 30 minutes of  aerobic without signs/symptoms of physical distress Progress to 30 minutes of  aerobic without signs/symptoms of physical distress Progress to 30 minutes of  aerobic without signs/symptoms of physical distress   Intensity THRR New THRR unchanged THRR unchanged THRR unchanged THRR unchanged     Progression   Progression Continue to progress workloads to maintain intensity without signs/symptoms of physical distress. Continue to progress workloads to maintain intensity without signs/symptoms of physical distress. Continue  to progress workloads to maintain intensity without signs/symptoms of physical distress. Continue to progress workloads to maintain intensity without signs/symptoms of physical distress. Continue to progress workloads to maintain intensity without signs/symptoms of physical distress.   Average METs 1.43 2.3 2.3 1.72 1.9     Resistance Training   Training Prescription -- Yes Yes Yes Yes   Weight -- 4 lb 4 lb 4 lb 4 lb   Reps -- 10-15 10-15 10-15 10-15     Interval Training   Interval Training -- No No No No     Treadmill   MPH -- 1.3 -- 1.4 --   Grade -- 0 -- 0 --  Minutes -- 15 -- 15 --   METs -- 1.99 -- 2.07 --     Recumbant Bike   Level -- 1 4 -- 3   Watts -- 25 19 -- 19   Minutes -- 15 15 -- 15   METs -- 2.91 2.68 -- 2.68     NuStep   Level -- 4 4 -- --   Minutes -- 15 15 -- --   METs -- -- 2.2 -- --     Arm Ergometer   Level -- -- -- 1.2 1.2   Minutes -- -- -- 15 15   METs -- -- -- 1 1.8     T5 Nustep   Level -- 3 4 4 4    Minutes -- 15 15 15 15    METs -- 2.1 2.1 2.1 2     Biostep-RELP   Level -- -- -- -- 3   Minutes -- -- -- -- 15   METs -- -- -- -- 2     Track   Laps -- -- -- -- 16   Minutes -- -- -- -- 15   METs -- -- -- -- 1.87     Oxygen   Maintain Oxygen Saturation -- 88% or higher 88% or higher 88% or higher 88% or higher    Row Name 10/23/23 1100 11/06/23 0900 12/06/23 0700 12/19/23 1500       Response to Exercise   Blood Pressure (Admit) -- 142/82 122/64 122/62    Blood Pressure (Exit) -- 110/60 118/60 104/62    Heart Rate (Admit) -- 76 bpm 78 bpm 76 bpm    Heart Rate (Exercise) -- 105 bpm 107 bpm 107 bpm    Heart Rate (Exit) -- 73 bpm 78 bpm 88 bpm    Rating of Perceived Exertion (Exercise) -- 15 12 13     Symptoms -- none none none    Duration -- Progress to 30 minutes of  aerobic without signs/symptoms of physical distress Progress to 30 minutes of  aerobic without signs/symptoms of physical distress Progress to 30 minutes of  aerobic  without signs/symptoms of physical distress    Intensity -- THRR unchanged THRR unchanged THRR unchanged      Progression   Progression -- Continue to progress workloads to maintain intensity without signs/symptoms of physical distress. Continue to progress workloads to maintain intensity without signs/symptoms of physical distress. Continue to progress workloads to maintain intensity without signs/symptoms of physical distress.    Average METs -- 1.99 2.18 1.91      Resistance Training   Training Prescription -- Yes Yes Yes    Weight -- 4 lb 4 lb 4 lb    Reps -- 10-15 10-15 10-15      Interval Training   Interval Training -- No No No      Oxygen   Oxygen -- -- -- Continuous      Recumbant Bike   Level -- 3 3 --    Watts -- 20 20 --    Minutes -- 15 15 --    METs -- 2.71 2.73 --      Arm Ergometer   Level -- 1.2 -- --    Minutes -- 15 -- --    METs -- 1 -- --      T5 Nustep   Level -- 3 2 3     Minutes -- 15 15 15     METs -- 2 1.9 2      Biostep-RELP   Level -- --  3 --    Minutes -- -- 15 --    METs -- -- 2 --      Track   Laps -- 16 20 15     Minutes -- 15 15 15     METs -- 1.87 2.09 1.82      Home Exercise Plan   Plans to continue exercise at Home (comment)  walking around his property and exercises from packet at home Home (comment)  walking around his property and exercises from packet at home Home (comment)  walking around his property and exercises from packet at home Home (comment)  walking around his property and exercises from packet at home    Frequency Add 1 additional day to program exercise sessions. Add 1 additional day to program exercise sessions. Add 1 additional day to program exercise sessions. Add 1 additional day to program exercise sessions.    Initial Home Exercises Provided 10/23/23 10/23/23 10/23/23 10/23/23      Oxygen   Maintain Oxygen Saturation -- 88% or higher 88% or higher 88% or higher             Exercise Comments:   Exercise  Comments     Row Name 08/26/23 1130 10/09/23 1241         Exercise Comments First full day of exercise!  Patient was oriented to gym and equipment including functions, settings, policies, and procedures.  Patient's individual exercise prescription and treatment plan were reviewed.  All starting workloads were established based on the results of the 6 minute walk test done at initial orientation visit.  The plan for exercise progression was also introduced and progression will be customized based on patient's performance and goals. Recent hospitalization for angina.  Meds increased to pre NSTEMI level.               Exercise Goals and Review:   Exercise Goals     Row Name 08/21/23 1556             Exercise Goals   Increase Physical Activity Yes       Intervention Provide advice, education, support and counseling about physical activity/exercise needs.;Develop an individualized exercise prescription for aerobic and resistive training based on initial evaluation findings, risk stratification, comorbidities and participant's personal goals.       Expected Outcomes Short Term: Attend rehab on a regular basis to increase amount of physical activity.;Long Term: Add in home exercise to make exercise part of routine and to increase amount of physical activity.;Long Term: Exercising regularly at least 3-5 days a week.       Increase Strength and Stamina Yes       Intervention Provide advice, education, support and counseling about physical activity/exercise needs.;Develop an individualized exercise prescription for aerobic and resistive training based on initial evaluation findings, risk stratification, comorbidities and participant's personal goals.       Expected Outcomes Short Term: Increase workloads from initial exercise prescription for resistance, speed, and METs.;Short Term: Perform resistance training exercises routinely during rehab and add in resistance training at home;Long Term:  Improve cardiorespiratory fitness, muscular endurance and strength as measured by increased METs and functional capacity ( )       Able to understand and use rate of perceived exertion (RPE) scale Yes       Intervention Provide education and explanation on how to use RPE scale       Expected Outcomes Short Term: Able to use RPE daily in rehab to express subjective intensity level;Long Term:  Able to use RPE to guide intensity level when exercising independently       Able to understand and use Dyspnea scale Yes       Intervention Provide education and explanation on how to use Dyspnea scale       Expected Outcomes Short Term: Able to use Dyspnea scale daily in rehab to express subjective sense of shortness of breath during exertion;Long Term: Able to use Dyspnea scale to guide intensity level when exercising independently       Knowledge and understanding of Target Heart Rate Range (THRR) Yes       Intervention Provide education and explanation of THRR including how the numbers were predicted and where they are located for reference       Expected Outcomes Short Term: Able to state/look up THRR;Short Term: Able to use daily as guideline for intensity in rehab;Long Term: Able to use THRR to govern intensity when exercising independently       Able to check pulse independently Yes       Intervention Provide education and demonstration on how to check pulse in carotid and radial arteries.;Review the importance of being able to check your own pulse for safety during independent exercise       Expected Outcomes Short Term: Able to explain why pulse checking is important during independent exercise;Long Term: Able to check pulse independently and accurately       Understanding of Exercise Prescription Yes       Intervention Provide education, explanation, and written materials on patient's individual exercise prescription       Expected Outcomes Short Term: Able to explain program exercise  prescription;Long Term: Able to explain home exercise prescription to exercise independently                Exercise Goals Re-Evaluation :  Exercise Goals Re-Evaluation     Row Name 08/26/23 1130 09/12/23 1648 09/25/23 1401 10/10/23 1539 10/10/23 1540     Exercise Goal Re-Evaluation   Exercise Goals Review Able to understand and use rate of perceived exertion (RPE) scale;Able to understand and use Dyspnea scale;Knowledge and understanding of Target Heart Rate Range (THRR);Understanding of Exercise Prescription Increase Physical Activity;Increase Strength and Stamina;Understanding of Exercise Prescription Increase Physical Activity;Increase Strength and Stamina;Understanding of Exercise Prescription Increase Physical Activity;Increase Strength and Stamina;Understanding of Exercise Prescription Increase Physical Activity;Increase Strength and Stamina;Understanding of Exercise Prescription   Comments Reviewed RPE  and dyspnea scale, THR and program prescription with pt today.  Pt voiced understanding and was given a copy of goals to take home. Mike Decker is off to a great start in the program. He has attended his first 5 session during this review. He has already been able to increase his level on the T4 nustep from 1 to 4. We will continue to monitor his progress in the program. Mike Decker continues to do well in rehab. He has recently been able to signifigantly increase his level on the recumbent bike from level 1 to 4. He has also been able to maintain a consistent intensity on both the T4 and T5 nustep. We will continue to monitor his progress in the program. Mike Decker continues to do well in rehab. Mike Decker continues to do well in rehab. He was only able to attend 3 sessions during this review. In those 3 sessions he was able to maintain a consistent workload. We will continue to monitor his progress in the program.   Expected Outcomes Short: Use RPE daily to regulate intensity. Long: Follow  program prescription in  THR. Short: Continue to follow current exercise prescription, and progressively increase workloads. Long: Continue exercise to improve strength and stamina. Short: Continue to follow current exercise prescription, and progressively increase workloads. Long: Continue exercise to improve strength and stamina. -- Short: Continue to follow current exercise prescription, and progressively increase workloads. Long: Continue exercise to improve strength and stamina.    Row Name 10/22/23 1500 10/23/23 1152 11/06/23 0948 11/19/23 1534 12/06/23 0729     Exercise Goal Re-Evaluation   Exercise Goals Review Increase Physical Activity;Increase Strength and Stamina;Understanding of Exercise Prescription Increase Physical Activity;Able to understand and use rate of perceived exertion (RPE) scale;Knowledge and understanding of Target Heart Rate Range (THRR);Understanding of Exercise Prescription;Increase Strength and Stamina;Able to understand and use Dyspnea scale;Able to check pulse independently Increase Physical Activity;Increase Strength and Stamina;Understanding of Exercise Prescription Increase Physical Activity;Increase Strength and Stamina;Understanding of Exercise Prescription Increase Physical Activity;Increase Strength and Stamina;Understanding of Exercise Prescription   Comments Mike Decker continues to do well in rehab. He has been able to maintain a constant workload on the recumbent bike and T5 nustep. He was also able to add the biostep at level 2, and the track at 16 laps, to his current exercise prescription. We will continue to monitor his progress in the program. Reviewed home exercise with pt today.  Pt plans to walk around property and do weight/band exercises from our packet for exercise. Pt plans to add 1 additional day of home exercise. Reviewed THR, pulse, RPE, sign and symptoms, pulse oximetery and when to call 911 or MD.  Also discussed weather considerations and indoor options.  Pt voiced understanding.  Mike Decker is doing well in rehab. He continues to walk 16 laps on the track and has stayed consistent with his workloads on the arm crank and recumbent bike. He did see a decrease from level 4 to level 3 on the T5 nustep. We will continue to monitor his progress in the program. Mike Decker has not attended rehab since 11/18. He has been in contact with us , and we hope he returns when appropriate. We will continue to monitor his progress in the program. Mike Decker is doing well although he has only attended rehab twice since the last review. However, he was able to increase to 20 laps walked on the track. He also continues to work at level 3 on both the recumbent bike and the biostep. We will continue to monitor his progress in the program.   Expected Outcomes Short: Continue to follow current exercise prescription, and progressively increase workloads. Long: Continue exercise to improve strength and stamina. Short: Add 1 additional day of home exercise involving walking and weight/band exercises. Long: Continue to exercise at home independently. Short: Increase back up to level 4 on the T5 nustep. Long: Continue exercise to improve strength and stamina. Short: Return to the program. Long: Continue exercise to improve strength and stamina. Short: Attend rehab more regularly. Long: Continue exercise to improve strength and stamina.    Row Name 12/18/23 1309 12/19/23 1544           Exercise Goal Re-Evaluation   Exercise Goals Review Increase Physical Activity Increase Physical Activity;Increase Strength and Stamina;Understanding of Exercise Prescription      Comments Mike Decker is walking his dog at least 3-4 times a day. He has not stated walking longer time and distance yet. He has been having frequent bouts of angina that had been happening when he exerted himself.  He is now wearing a nitroglycerin  patch and  his angina symptoms have decreased.  He will try walking longet time at home.  suggested he walk around in his house.  Mike Decker has returned to rehab and is doing well. He was able to return to level 3 on the T5 nustep. He was also able to complete 15 laps in 15 minutes. We will continue to monitor his progress in the program.      Expected Outcomes STG Walk more than the times with his dog. LTG Mike Decker is walking at least 10 -15 min at home several days a week Short: attend rehab more regularly. Long: Continue exercise to improve strength and stamina.               Discharge Exercise Prescription (Final Exercise Prescription Changes):  Exercise Prescription Changes - 12/19/23 1500       Response to Exercise   Blood Pressure (Admit) 122/62    Blood Pressure (Exit) 104/62    Heart Rate (Admit) 76 bpm    Heart Rate (Exercise) 107 bpm    Heart Rate (Exit) 88 bpm    Rating of Perceived Exertion (Exercise) 13    Symptoms none    Duration Progress to 30 minutes of  aerobic without signs/symptoms of physical distress    Intensity THRR unchanged      Progression   Progression Continue to progress workloads to maintain intensity without signs/symptoms of physical distress.    Average METs 1.91      Resistance Training   Training Prescription Yes    Weight 4 lb    Reps 10-15      Interval Training   Interval Training No      Oxygen   Oxygen Continuous      T5 Nustep   Level 3    Minutes 15    METs 2      Track   Laps 15    Minutes 15    METs 1.82      Home Exercise Plan   Plans to continue exercise at Home (comment)   walking around his property and exercises from packet at home   Frequency Add 1 additional day to program exercise sessions.    Initial Home Exercises Provided 10/23/23      Oxygen   Maintain Oxygen Saturation 88% or higher             Nutrition:  Target Goals: Understanding of nutrition guidelines, daily intake of sodium 1500mg , cholesterol 200mg , calories 30% from fat and 7% or less from saturated fats, daily to have 5 or more servings of fruits and  vegetables.  Education: All About Nutrition: -Group instruction provided by verbal, written material, interactive activities, discussions, models, and posters to present general guidelines for heart healthy nutrition including fat, fiber, MyPlate, the role of sodium in heart healthy nutrition, utilization of the nutrition label, and utilization of this knowledge for meal planning. Follow up email sent as well. Written material given at graduation. Flowsheet Row Cardiac Rehab from 08/21/2023 in [facility] Cardiac and Pulmonary Rehab  Education need identified 08/21/23       Biometrics:  Pre Biometrics - 08/21/23 1557       Pre Biometrics   Height 5' 6.2" (1.681 m)    Weight 188 lb 9.6 oz (85.5 kg)    Waist Circumference 45.5 inches    Hip Circumference 46.5 inches    Waist to Hip Ratio 0.98 %    BMI (Calculated) 30.27    Single Leg Stand 4 seconds  Nutrition Therapy Plan and Nutrition Goals:  Nutrition Therapy & Goals - 08/21/23 1559       Personal Nutrition Goals   Nutrition Goal Will meet with RD in the next 2 weeks      Intervention Plan   Intervention Prescribe, educate and counsel regarding individualized specific dietary modifications aiming towards targeted core components such as weight, hypertension, lipid management, diabetes, heart failure and other comorbidities.;Nutrition handout(s) given to patient.    Expected Outcomes Short Term Goal: Understand basic principles of dietary content, such as calories, fat, sodium, cholesterol and nutrients.;Short Term Goal: A plan has been developed with personal nutrition goals set during dietitian appointment.;Long Term Goal: Adherence to prescribed nutrition plan.             Nutrition Assessments:  MEDIFICTS Score Key: >=70 Need to make dietary changes  40-70 Heart Healthy Diet <= 40 Therapeutic Level Cholesterol Diet  Flowsheet Row Cardiac Rehab from 08/21/2023 in [facility] Cardiac and Pulmonary Rehab  Picture  Your Plate Total Score on Admission 69      Picture Your Plate Scores: <75 Unhealthy dietary pattern with much room for improvement. 41-50 Dietary pattern unlikely to meet recommendations for good health and room for improvement. 51-60 More healthful dietary pattern, with some room for improvement.  >60 Healthy dietary pattern, although there may be some specific behaviors that could be improved.    Nutrition Goals Re-Evaluation:  Nutrition Goals Re-Evaluation     Row Name 12/18/23 1318             Goals   Comment has not meet with RD this time in program. He does his best to eat healthy.  He was out of town this past week and they ate foods he does not normally eat. He has fluid retention and is working on taking his diurectic to get back to his ususal weight       Expected Outcome STG get back to baseline weight watch sodium intake, try better food choices if possible when on vacation.   LTG  Maintain healthy eating habits                Nutrition Goals Discharge (Final Nutrition Goals Re-Evaluation):  Nutrition Goals Re-Evaluation - 12/18/23 1318       Goals   Comment has not meet with RD this time in program. He does his best to eat healthy.  He was out of town this past week and they ate foods he does not normally eat. He has fluid retention and is working on taking his diurectic to get back to his ususal weight    Expected Outcome STG get back to baseline weight watch sodium intake, try better food choices if possible when on vacation.   LTG  Maintain healthy eating habits             Psychosocial: Target Goals: Acknowledge presence or absence of significant depression and/or stress, maximize coping skills, provide positive support system. Participant is able to verbalize types and ability to use techniques and skills needed for reducing stress and depression.   Education: Stress, Anxiety, and Depression - Group verbal and visual presentation to define topics  covered.  Reviews how body is impacted by stress, anxiety, and depression.  Also discusses healthy ways to reduce stress and to treat/manage anxiety and depression.  Written material given at graduation.   Education: Sleep Hygiene -Provides group verbal and written instruction about how sleep can affect your health.  Define sleep hygiene, discuss  sleep cycles and impact of sleep habits. Review good sleep hygiene tips.    Initial Review & Psychosocial Screening:  Initial Psych Review & Screening - 08/08/23 1447       Initial Review   Current issues with Current Sleep Concerns      Family Dynamics   Good Support System? Yes   wife     Screening Interventions   Interventions To provide support and resources with identified psychosocial needs    Expected Outcomes Short Term goal: Utilizing psychosocial counselor, staff and physician to assist with identification of specific Stressors or current issues interfering with healing process. Setting desired goal for each stressor or current issue identified.;Long Term Goal: Stressors or current issues are controlled or eliminated.;Short Term goal: Identification and review with participant of any Quality of Life or Depression concerns found by scoring the questionnaire.;Long Term goal: The participant improves quality of Life and PHQ9 Scores as seen by post scores and/or verbalization of changes             Quality of Life Scores:   Quality of Life - 08/21/23 1558       Quality of Life   Select Quality of Life      Quality of Life Scores   Health/Function Pre 12.53 %    Socioeconomic Pre 24.58 %    Psych/Spiritual Pre 20.14 %    Family Pre 25.2 %    GLOBAL Pre 18.26 %            Scores of 19 and below usually indicate a poorer quality of life in these areas.  A difference of  2-3 points is a clinically meaningful difference.  A difference of 2-3 points in the total score of the Quality of Life Index has been associated with  significant improvement in overall quality of life, self-image, physical symptoms, and general health in studies assessing change in quality of life.  PHQ-9: Review Flowsheet  More data exists      08/21/2023 10/29/2022 12/18/2021 09/15/2021 06/28/2021  Depression screen PHQ 2/9  Decreased Interest 0 0 0 0 0  Down, Depressed, Hopeless 0 0 0 0 0  PHQ - 2 Score 0 0 0 0 0  Altered sleeping 3 - - - -  Tired, decreased energy 2 - - - -  Change in appetite 0 - - - -  Feeling bad or failure about yourself  0 - - - -  Trouble concentrating 0 - - - -  Moving slowly or fidgety/restless 0 - - - -  Suicidal thoughts 0 - - - -  PHQ-9 Score 5 - - - -  Difficult doing work/chores Not difficult at all - - - -   Interpretation of Total Score  Total Score Depression Severity:  1-4 = Minimal depression, 5-9 = Mild depression, 10-14 = Moderate depression, 15-19 = Moderately severe depression, 20-27 = Severe depression   Psychosocial Evaluation and Intervention:  Psychosocial Evaluation - 08/08/23 1513       Psychosocial Evaluation & Interventions   Interventions Relaxation education;Encouraged to exercise with the program and follow exercise prescription    Comments Mike "Mike Decker" is coming to cardiac rehab post NSTEMI. 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 He has no  barriers to attending the program. He wants to improve his strength and become physically able and comfortable walking around. He has some sleep concerns but stress seems to be under control. He has a good support system at home with his wife. Mike Decker is looking forward to starting rehab.    Expected Outcomes Short: Attend cardiac rehab for education and exercise.  Long: [place]lop and maintain  positive self care habits    Continue Psychosocial Services  Follow up required by staff             Psychosocial Re-Evaluation:  Psychosocial Re-Evaluation     Row Name 12/18/23 1313             Psychosocial Re-Evaluation   Current issues with None Identified       Comments Mike Decker has been doing well without any concerns. His angina symptoms are better controlled after he started wearing the nitro patch prescribed in Dec. He recently was put of town with his family and did fine.       Expected Outcomes STG  COntinue to monitor health and follow MD prescibed activities and meds. Attend scheduled program sessions   LTG Maintainence of symptoms with meds and activities       Interventions Encouraged to attend Cardiac Rehabilitation for the exercise       Continue Psychosocial Services  Follow up required by staff                Psychosocial Discharge (Final Psychosocial Re-Evaluation):  Psychosocial Re-Evaluation - 12/18/23 1313       Psychosocial Re-Evaluation   Current issues with None Identified    Comments Mike Decker has been doing well without any concerns. His angina symptoms are better controlled after he started wearing the nitro patch prescribed in Dec. He recently was put of town with his family and did fine.    Expected Outcomes STG  COntinue to monitor health and follow MD prescibed activities and meds. Attend scheduled program sessions   LTG Maintainence of symptoms with meds and activities    Interventions Encouraged to attend Cardiac Rehabilitation for the exercise    Continue Psychosocial Services  Follow up required by staff             Vocational Rehabilitation: Provide vocational rehab assistance to qualifying candidates.   Vocational Rehab Evaluation & Intervention:  Vocational Rehab - 08/08/23 1505       Initial Vocational Rehab Evaluation & Intervention   Assessment shows need for Vocational Rehabilitation No              Education: Education Goals: Education classes will be provided on a variety of topics geared toward better understanding of heart health and risk factor modification. Participant will state understanding/return demonstration of topics presented as noted by education test scores.  Learning Barriers/Preferences:  Learning Barriers/Preferences - 08/08/23 1452       Learning Barriers/Preferences   Learning Barriers None    Learning Preferences None             General Cardiac Education Topics:  AED/CPR: - Group verbal and written instruction with the use of models to demonstrate the basic use of the AED with the basic ABC's of resuscitation.   Anatomy and Cardiac Procedures: - Group verbal and visual presentation and models provide information about basic cardiac anatomy and function. Reviews the testing methods done to diagnose heart disease and the outcomes of the test results. Describes the treatment choices: Medical Management, Angioplasty, or Coronary Bypass  Surgery for treating various heart conditions including Myocardial Infarction, Angina, Valve Disease, and Cardiac Arrhythmias.  Written material given at graduation.   Medication Safety: - Group verbal and visual instruction to review commonly prescribed medications for heart and lung disease. Reviews the medication, class of the drug, and side effects. Includes the steps to properly store meds and maintain the prescription regimen.  Written material given at graduation.   Intimacy: - Group verbal instruction through game format to discuss how heart and lung disease can affect sexual intimacy. Written material given at graduation..   Know Your Numbers and Heart Failure: - Group verbal and visual instruction to discuss disease risk factors for cardiac and pulmonary disease and treatment options.  Reviews associated critical values for Overweight/Obesity, Hypertension, Cholesterol, and Diabetes.  Discusses basics of heart  failure: signs/symptoms and treatments.  Introduces Heart Failure Zone chart for action plan for heart failure.  Written material given at graduation.   Infection Prevention: - Provides verbal and written material to individual with discussion of infection control including proper hand washing and proper equipment cleaning during exercise session. Flowsheet Row Cardiac Rehab from 08/21/2023 in [facility] Cardiac and Pulmonary Rehab  Date 08/21/23  Educator MB  Instruction Review Code 1- Verbalizes Understanding       Falls Prevention: - Provides verbal and written material to individual with discussion of falls prevention and safety. Flowsheet Row Cardiac Rehab from 08/21/2023 in [facility] Cardiac and Pulmonary Rehab  Date 08/21/23  Educator MB  Instruction Review Code 1- Verbalizes Understanding       Other: -Provides group and verbal instruction on various topics (see comments)   Knowledge Questionnaire Score:  Knowledge Questionnaire Score - 08/21/23 1603       Knowledge Questionnaire Score   Pre Score 23/26             Core Components/Risk Factors/Patient Goals at Admission:  Personal Goals and Risk Factors at Admission - 08/21/23 1603       Core Components/Risk Factors/Patient Goals on Admission    Weight Management Yes;Weight Loss    Intervention Weight Management: Develop a combined nutrition and exercise program designed to reach desired caloric intake, while maintaining appropriate intake of nutrient and fiber, sodium and fats, and appropriate energy expenditure required for the weight goal.;Weight Management: Provide education and appropriate resources to help participant work on and attain dietary goals.;Weight Management/Obesity: Establish reasonable short term and long term weight goals.    Admit Weight 188 lb 9.6 oz (85.5 kg)    Goal Weight: Short Term 178 lb (80.7 kg)    Goal Weight: Long Term 173 lb (78.5 kg)    Expected Outcomes Short Term: Continue to assess and  modify interventions until short term weight is achieved;Long Term: Adherence to nutrition and physical activity/exercise program aimed toward attainment of established weight goal;Weight Maintenance: Understanding of the daily nutrition guidelines, which includes 25-35% calories from fat, 7% or less cal from saturated fats, less than 200mg  cholesterol, less than 1.5gm of sodium, & 5 or more servings of fruits and vegetables daily;Understanding recommendations for meals to include 15-35% energy as protein, 25-35% energy from fat, 35-60% energy from carbohydrates, less than 200mg  of dietary cholesterol, 20-35 gm of total fiber daily;Weight Loss: Understanding of general recommendations for a balanced deficit meal plan, which promotes 1-2 lb weight loss per week and includes a negative energy balance of [phone] kcal/d;Understanding of distribution of calorie intake throughout the day with the consumption of 4-5 meals/snacks    Improve shortness  of breath with ADL's Yes    Intervention Provide education, individualized exercise plan and daily activity instruction to help decrease symptoms of SOB with activities of daily living.    Expected Outcomes Short Term: Improve cardiorespiratory fitness to achieve a reduction of symptoms when performing ADLs;Long Term: Be able to perform more ADLs without symptoms or delay the onset of symptoms    Diabetes Yes    Intervention Provide education about signs/symptoms and action to take for hypo/hyperglycemia.;Provide education about proper nutrition, including hydration, and aerobic/resistive exercise prescription along with prescribed medications to achieve blood glucose in normal ranges: Fasting glucose 65-99 mg/dL    Expected Outcomes Short Term: Participant verbalizes understanding of the signs/symptoms and immediate care of hyper/hypoglycemia, proper foot care and importance of medication, aerobic/resistive exercise and nutrition plan for blood glucose control.;Long  Term: Attainment of HbA1C < 7%.    Heart Failure Yes    Intervention Provide a combined exercise and nutrition program that is supplemented with education, support and counseling about heart failure. Directed toward relieving symptoms such as shortness of breath, decreased exercise tolerance, and extremity edema.    Expected Outcomes Improve functional capacity of life;Short term: Attendance in program 2-3 days a week with increased exercise capacity. Reported lower sodium intake. Reported increased fruit and vegetable intake. Reports medication compliance.;Short term: Daily weights obtained and reported for increase. Utilizing diuretic protocols set by physician.;Long term: Adoption of self-care skills and reduction of barriers for early signs and symptoms recognition and intervention leading to self-care maintenance.    Hypertension Yes    Intervention Provide education on lifestyle modifcations including regular physical activity/exercise, weight management, moderate sodium restriction and increased consumption of fresh fruit, vegetables, and low fat dairy, alcohol moderation, and smoking cessation.;Monitor prescription use compliance.    Expected Outcomes Short Term: Continued assessment and intervention until BP is < 140/64mm HG in hypertensive participants. < 130/68mm HG in hypertensive participants with diabetes, heart failure or chronic kidney disease.;Long Term: Maintenance of blood pressure at goal levels.    Lipids Yes    Intervention Provide education and support for participant on nutrition & aerobic/resistive exercise along with prescribed medications to achieve LDL 70mg , HDL >40mg .    Expected Outcomes Short Term: Participant states understanding of desired cholesterol values and is compliant with medications prescribed. Participant is following exercise prescription and nutrition guidelines.;Long Term: Cholesterol controlled with medications as prescribed, with individualized exercise RX  and with personalized nutrition plan. Value goals: LDL < 70mg , HDL > 40 mg.             Education:Diabetes - Individual verbal and written instruction to review signs/symptoms of diabetes, desired ranges of glucose level fasting, after meals and with exercise. Acknowledge that pre and post exercise glucose checks will be done for 3 sessions at entry of program. Flowsheet Row Cardiac Rehab from 08/21/2023 in [facility] Cardiac and Pulmonary Rehab  Date 08/21/23  Educator MB  Instruction Review Code 1- Verbalizes Understanding       Core Components/Risk Factors/Patient Goals Review:   Goals and Risk Factor Review     Row Name 12/18/23 1320             Core Components/Risk Factors/Patient Goals Review   Personal Goals Review Weight Management/Obesity;Improve shortness of breath with ADL's;Diabetes;Hypertension;Lipids       Review Mike Decker has been out of town and has gained weight from fluid retention, he is taking his diurectics as prescribed., he has states that the exercise is helping his breathing, seeing  improvements with movement of less shortness of breath. HIs last A1C was 7.1%, he is weighing himself daily to watch for increases in weight. He has a daily duirectic to take. His BP after exercise is 110-140/60, pre exercise 122-142/60-80. He is back to workon consistent exercise plan, continue to take his meds as prescribed and continue to monitor his daily blood sugar levels.   Last drawn  lipid was in July , triglycerides were elevated,so no LDL result.       Expected Outcomes STG Continue to attend sessions, work on adding exericse routine at home. Watch sodium intake and continue taking prescribed medications  LTG maintianing a healthy lifestyle after discharge                Core Components/Risk Factors/Patient Goals at Discharge (Final Review):   Goals and Risk Factor Review - 12/18/23 1320       Core Components/Risk Factors/Patient Goals Review   Personal Goals Review  Weight Management/Obesity;Improve shortness of breath with ADL's;Diabetes;Hypertension;Lipids    Review Mike Decker has been out of town and has gained weight from fluid retention, he is taking his diurectics as prescribed., he has states that the exercise is helping his breathing, seeing improvements with movement of less shortness of breath. HIs last A1C was 7.1%, he is weighing himself daily to watch for increases in weight. He has a daily duirectic to take. His BP after exercise is 110-140/60, pre exercise 122-142/60-80. He is back to workon consistent exercise plan, continue to take his meds as prescribed and continue to monitor his daily blood sugar levels.   Last drawn  lipid was in July , triglycerides were elevated,so no LDL result.    Expected Outcomes STG Continue to attend sessions, work on adding exericse routine at home. Watch sodium intake and continue taking prescribed medications  LTG maintianing a healthy lifestyle after discharge             ITP Comments:  ITP Comments     Row Name 08/08/23 1510 08/21/23 1550 08/26/23 1130 09/11/23 1009 10/09/23 1240   ITP Comments Initial phone call completed. Dx can be found in [facility] 7/12. EP orientation scheduled for 9/11 at 1:30 pm. Completed and gym orientation. Initial ITP created and sent for review to Dr. Eddy Goodell. First full day of exercise!  Patient was oriented to gym and equipment including functions, settings, policies, and procedures.  Patient's individual exercise prescription and treatment plan were reviewed.  All starting workloads were established based on the results of the 6 minute walk test done at initial orientation visit.  The plan for exercise progression was also introduced and progression will be customized based on patient's performance and goals. 30 Day review completed. Medical Director ITP review done, changes made as directed, and signed approval by Medical Director.    new to program 30 Day review completed. Medical  Director ITP review done, changes made as directed, and signed approval by Medical Director.    Row Name 10/09/23 1241 10/30/23 1137 11/19/23 1248 11/27/23 0957 12/25/23 1305   ITP Comments Recent hospitalization for angina.  Meds increased to pre NSTEMI level. 30 Day review completed. Medical Director ITP review done, changes made as directed, and signed approval by Medical Director. Mike Decker's wife called to inform staff that he was in the emergency department last night with chest pain. He is following up with Dr. Beau Bound and plans to be out the rest of the week. They will call about next week if he  needs to be absent. 30 Day review completed. Medical Director ITP review done, changes made as directed, and signed approval by Medical Director. 30 Day review completed. Medical Director ITP review done, changes made as directed, and signed approval by Medical Director.            Comments:

## 2023-12-25 NOTE — Progress Notes (Signed)
 Daily Session Note  Patient Details  Name: Mike Decker MRN: [id number] Date of Birth: 05-Sep-1941 Referring Provider:   Flowsheet Row Cardiac Rehab from 08/21/2023 in [facility] Cardiac and Pulmonary Rehab  Referring Provider [facility]       Encounter Date: 12/25/2023  Check In:  Session Check In - 12/25/23 1133       Check-In   Supervising physician immediately available to respond to emergencies See telemetry face sheet for immediately available ER MD    Location [facility]-Cardiac & Pulmonary Rehab    Staff Present Maud Sorenson, RN, BSN, CCRP;Meredith Manson Seitz RN,BSN;Joseph Hood RCP,RRT,BSRT;Maxon Buckner BS, Exercise Physiologist    Virtual Visit No    Medication changes reported     No    Fall or balance concerns reported    No    Warm-up and Cool-down Performed on first and last piece of equipment    Resistance Training Performed Yes    VAD Patient? No    PAD/SET Patient? No      Pain Assessment   Currently in Pain? No/denies                Social History   Tobacco Use  Smoking Status Former   Current packs/day: 0.00   Types: Cigarettes   Start date: 12/10/1956   Quit date: 12/10/1976   Years since quitting: 47.0  Smokeless Tobacco Never    Goals Met:  Independence with exercise equipment Exercise tolerated well No report of concerns or symptoms today  Goals Unmet:  Not Applicable  Comments: Pt able to follow exercise prescription today without complaint.  Will continue to monitor for progression.    Dr. Firman Hughes is Medical Director for [facility] Cardiac Rehabilitation.  Dr. Fuad Aleskerov is Medical Director for [facility] Pulmonary Rehabilitation.

## 2023-12-27 ENCOUNTER — Encounter: Payer: Medicare Other | Admitting: *Deleted

## 2023-12-27 DIAGNOSIS — I214 Non-ST elevation (NSTEMI) myocardial infarction: Secondary | ICD-10-CM

## 2023-12-27 NOTE — Progress Notes (Signed)
Daily Session Note  Patient Details  Name: Mike Decker MRN: [id number] Date of Birth: 1941/11/11 Referring Provider:   Flowsheet Row Cardiac Rehab from 08/21/2023 in [facility] Cardiac and Pulmonary Rehab  Referring Provider [facility]       Encounter Date: 12/27/2023  Check In:  Session Check In - 12/27/23 1222       Check-In   Supervising physician immediately available to respond to emergencies See telemetry face sheet for immediately available ER MD    Location [facility]-Cardiac & Pulmonary Rehab    Staff Present Bess Kinds RN,BSN;Noah Tickle, BS, Exercise Physiologist;Joseph Reino Kent RCP,RRT,BSRT    Virtual Visit No    Medication changes reported     No    Fall or balance concerns reported    No    Warm-up and Cool-down Performed on first and last piece of equipment    Resistance Training Performed Yes    VAD Patient? No    PAD/SET Patient? No      Pain Assessment   Currently in Pain? No/denies                Social History   Tobacco Use  Smoking Status Former   Current packs/day: 0.00   Types: Cigarettes   Start date: 12/10/1956   Quit date: 12/10/1976   Years since quitting: 47.0  Smokeless Tobacco Never    Goals Met:  Independence with exercise equipment Exercise tolerated well No report of concerns or symptoms today Strength training completed today  Goals Unmet:  Not Applicable  Comments: Pt able to follow exercise prescription today without complaint.  Will continue to monitor for progression.    Dr. Bethann Punches is Medical Director for [facility] Cardiac Rehabilitation.  Dr. Vida Rigger is Medical Director for [facility] Pulmonary Rehabilitation.

## 2023-12-31 ENCOUNTER — Telehealth (HOSPITAL_COMMUNITY): Payer: Self-pay | Admitting: *Deleted

## 2023-12-31 NOTE — Telephone Encounter (Signed)
Reaching out to patient to offer assistance regarding upcoming cardiac imaging study; spoke to wife,  verbalizes understanding of appt date/time, parking situation and where to check in, pre-test NPO status and medications ordered, and verified current allergies; name and call back number provided for further questions should they arise Johney Frame RN Navigator Cardiac Imaging [facility] [phone] office [phone] cell  Aware to avoid caffeine, nitroglycerin patch, imdur, and ranexa prior to test.

## 2024-01-01 ENCOUNTER — Encounter: Payer: Self-pay | Admitting: Oncology

## 2024-01-02 ENCOUNTER — Ambulatory Visit
Admission: RE | Admit: 2024-01-02 | Discharge: 2024-01-02 | Disposition: A | Discharge status: Home / Self Care | Payer: Medicare Other | Source: Ambulatory Visit | Attending: Student | Admitting: Student

## 2024-01-02 DIAGNOSIS — I2089 Other forms of angina pectoris: Secondary | ICD-10-CM | POA: Insufficient documentation

## 2024-01-02 DIAGNOSIS — I700 Atherosclerosis of aorta: Secondary | ICD-10-CM | POA: Diagnosis not present

## 2024-01-02 DIAGNOSIS — D3501 Benign neoplasm of right adrenal gland: Secondary | ICD-10-CM | POA: Diagnosis not present

## 2024-01-02 MED ORDER — RUBIDIUM RB82 GENERATOR (RUBYFILL)
25.0000 | PACK | Freq: Once | INTRAVENOUS | Status: AC
  Administered 2024-01-02: 21.03 via INTRAVENOUS

## 2024-01-02 MED ORDER — REGADENOSON 0.4 MG/5ML IV SOLN
0.4000 mg | Freq: Once | INTRAVENOUS | Status: AC
Start: 2024-01-02 — End: 2024-01-02
  Administered 2024-01-02: 0.4 mg via INTRAVENOUS
  Filled 2024-01-02: qty 5

## 2024-01-02 MED ORDER — REGADENOSON 0.4 MG/5ML IV SOLN
INTRAVENOUS | Status: AC
  Filled 2024-01-02: qty 5

## 2024-01-02 NOTE — Progress Notes (Signed)
Patient presents for a cardiac PET stress test and tolerated procedure without incident. Patient maintained acceptable vital signs throughout the test and was offered caffeine after test.  Patient ambulated  with a steady gait.

## 2024-01-03 LAB — NM PET CT CARDIAC PERFUSION MULTI W/ABSOLUTE BLOODFLOW
LV dias vol: 88 mL (ref 62–150)
LV sys vol: 51 mL
MBFR: 1.15
Nuc Rest EF: 26 %
Nuc Stress EF: 43 %
Peak HR: 77 {beats}/min
Rest HR: 70 {beats}/min
Rest MBF: 0.75 ml/g/min
Rest Nuclear Isotope Dose: 21 mCi
SRS: 5
SSS: 18
ST Depression (mm): 0 mm
Stress MBF: 0.86 ml/g/min
Stress Nuclear Isotope Dose: 21 mCi
TID: 1.07

## 2024-01-10 DIAGNOSIS — M722 Plantar fascial fibromatosis: Secondary | ICD-10-CM | POA: Diagnosis not present

## 2024-01-13 ENCOUNTER — Encounter: Payer: Medicare Other | Attending: Internal Medicine

## 2024-01-13 DIAGNOSIS — Z48812 Encounter for surgical aftercare following surgery on the circulatory system: Secondary | ICD-10-CM | POA: Insufficient documentation

## 2024-01-13 DIAGNOSIS — I252 Old myocardial infarction: Secondary | ICD-10-CM | POA: Insufficient documentation

## 2024-01-15 ENCOUNTER — Encounter: Payer: Medicare Other | Admitting: *Deleted

## 2024-01-15 DIAGNOSIS — I214 Non-ST elevation (NSTEMI) myocardial infarction: Secondary | ICD-10-CM | POA: Diagnosis present

## 2024-01-15 DIAGNOSIS — I252 Old myocardial infarction: Secondary | ICD-10-CM | POA: Diagnosis not present

## 2024-01-15 DIAGNOSIS — Z48812 Encounter for surgical aftercare following surgery on the circulatory system: Secondary | ICD-10-CM | POA: Diagnosis not present

## 2024-01-15 NOTE — Progress Notes (Signed)
 Daily Session Note  Patient Details  Name: Mike Decker MRN: [id number] Date of Birth: 12/08/1941 Referring Provider:   Flowsheet Row Cardiac Rehab from 08/21/2023 in [facility] Cardiac and Pulmonary Rehab  Referring Provider [facility]       Encounter Date: 01/15/2024  Check In:  Session Check In - 01/15/24 1130       Check-In   Supervising physician immediately available to respond to emergencies See telemetry face sheet for immediately available ER MD    Location [facility]-Cardiac & Pulmonary Rehab    Staff Present Rollene Paterson, MS, Exercise Physiologist;Larae Caison, RN, BSN, CCRP;Joseph Hood RCP,RRT,BSRT;Meredith Craven RN,BSN    Virtual Visit No    Medication changes reported     No    Fall or balance concerns reported    No    Warm-up and Cool-down Performed on first and last piece of equipment    Resistance Training Performed Yes    VAD Patient? No    PAD/SET Patient? No      Pain Assessment   Currently in Pain? No/denies                Social History   Tobacco Use  Smoking Status Former   Current packs/day: 0.00   Types: Cigarettes   Start date: 12/10/1956   Quit date: 12/10/1976   Years since quitting: 47.1  Smokeless Tobacco Never    Goals Met:  Independence with exercise equipment Exercise tolerated well No report of concerns or symptoms today  Goals Unmet:  Not Applicable  Comments: Pt able to follow exercise prescription today without complaint.  Will continue to monitor for progression.    Dr. Oneil Pinal is Medical Director for [facility] Cardiac Rehabilitation.  Dr. Fuad Aleskerov is Medical Director for [facility] Pulmonary Rehabilitation.

## 2024-01-20 ENCOUNTER — Encounter: Payer: Medicare Other | Admitting: *Deleted

## 2024-01-20 DIAGNOSIS — Z48812 Encounter for surgical aftercare following surgery on the circulatory system: Secondary | ICD-10-CM | POA: Diagnosis not present

## 2024-01-20 DIAGNOSIS — I252 Old myocardial infarction: Secondary | ICD-10-CM | POA: Diagnosis not present

## 2024-01-20 DIAGNOSIS — I214 Non-ST elevation (NSTEMI) myocardial infarction: Secondary | ICD-10-CM

## 2024-01-20 NOTE — Progress Notes (Signed)
 Daily Session Note  Patient Details  Name: Mike Decker MRN: [id number] Date of Birth: [birth year] Referring Provider:   Flowsheet Row Cardiac Rehab from 08/21/2023 in [facility] Cardiac and Pulmonary Rehab  Referring Provider [facility]       Encounter Date: 01/20/2024  Check In:  Session Check In - 01/20/24 1200       Check-In   Supervising physician immediately available to respond to emergencies See telemetry face sheet for immediately available ER MD    Location [facility]-Cardiac & Pulmonary Rehab    Staff Present Maud Sorenson, RN, BSN, CCRP;Meredith Manson Seitz RN,BSN;Kelly Ashland BS, ACSM CEP, Exercise Physiologist;Maxon Conetta BS, Exercise Physiologist;Margaret Best, MS, Exercise Physiologist    Virtual Visit No    Medication changes reported     No    Fall or balance concerns reported    No    Resistance Training Performed Yes    VAD Patient? No    PAD/SET Patient? No      Pain Assessment   Currently in Pain? No/denies                Social History   Tobacco Use  Smoking Status Former   Current packs/day: 0.00   Types: Cigarettes   Start date: 12/10/1956   Quit date: 12/10/1976   Years since quitting: 47.1  Smokeless Tobacco Never    Goals Met:  Independence with exercise equipment Exercise tolerated well No report of concerns or symptoms today  Goals Unmet:  Not Applicable  Comments: Pt able to follow exercise prescription today without complaint.  Will continue to monitor for progression.    Dr. Firman Hughes is Medical Director for [facility] Cardiac Rehabilitation.  Dr. Fuad Aleskerov is Medical Director for [facility] Pulmonary Rehabilitation.

## 2024-01-22 ENCOUNTER — Encounter: Payer: Self-pay | Admitting: *Deleted

## 2024-01-22 ENCOUNTER — Other Ambulatory Visit
Admission: RE | Admit: 2024-01-22 | Discharge: 2024-01-22 | Disposition: A | Discharge status: Home / Self Care | Payer: Medicare Other | Source: Ambulatory Visit | Attending: Internal Medicine | Admitting: Internal Medicine

## 2024-01-22 DIAGNOSIS — J449 Chronic obstructive pulmonary disease, unspecified: Secondary | ICD-10-CM | POA: Diagnosis not present

## 2024-01-22 DIAGNOSIS — I509 Heart failure, unspecified: Secondary | ICD-10-CM | POA: Diagnosis present

## 2024-01-22 DIAGNOSIS — G4733 Obstructive sleep apnea (adult) (pediatric): Secondary | ICD-10-CM | POA: Diagnosis not present

## 2024-01-22 DIAGNOSIS — E119 Type 2 diabetes mellitus without complications: Secondary | ICD-10-CM | POA: Diagnosis not present

## 2024-01-22 DIAGNOSIS — I2510 Atherosclerotic heart disease of native coronary artery without angina pectoris: Secondary | ICD-10-CM | POA: Diagnosis not present

## 2024-01-22 DIAGNOSIS — R42 Dizziness and giddiness: Secondary | ICD-10-CM | POA: Insufficient documentation

## 2024-01-22 DIAGNOSIS — E669 Obesity, unspecified: Secondary | ICD-10-CM | POA: Diagnosis not present

## 2024-01-22 DIAGNOSIS — I214 Non-ST elevation (NSTEMI) myocardial infarction: Secondary | ICD-10-CM

## 2024-01-22 DIAGNOSIS — Z794 Long term (current) use of insulin: Secondary | ICD-10-CM | POA: Diagnosis not present

## 2024-01-22 DIAGNOSIS — N184 Chronic kidney disease, stage 4 (severe): Secondary | ICD-10-CM | POA: Diagnosis not present

## 2024-01-22 DIAGNOSIS — E1142 Type 2 diabetes mellitus with diabetic polyneuropathy: Secondary | ICD-10-CM | POA: Diagnosis not present

## 2024-01-22 DIAGNOSIS — I10 Essential (primary) hypertension: Secondary | ICD-10-CM | POA: Diagnosis not present

## 2024-01-22 DIAGNOSIS — I482 Chronic atrial fibrillation, unspecified: Secondary | ICD-10-CM | POA: Diagnosis not present

## 2024-01-22 DIAGNOSIS — R079 Chest pain, unspecified: Secondary | ICD-10-CM | POA: Diagnosis not present

## 2024-01-22 DIAGNOSIS — E782 Mixed hyperlipidemia: Secondary | ICD-10-CM | POA: Diagnosis not present

## 2024-01-22 LAB — BRAIN NATRIURETIC PEPTIDE: B Natriuretic Peptide: 197.3 pg/mL — ABNORMAL HIGH (ref 0.0–100.0)

## 2024-01-22 NOTE — Progress Notes (Signed)
Mike Decker arrived to day stating he almost fainted when he arrived, found a chair and sat down, he came to program after he felt better. BP 160/82. He stated he just did not feel well. called his cardiologist and they were able to seehim in 10 minutes. Mike Decker was transported to cardiologist office via wheelchair.

## 2024-01-22 NOTE — Progress Notes (Signed)
30 Day review completed. Medical Director ITP review done, changes made as directed, and signed approval by Medical Director. ? ?

## 2024-01-22 NOTE — Progress Notes (Signed)
Cardiac Individual Treatment Plan  Patient Details  Name: Mike Decker MRN: [id number] Date of Birth: 08/21/1941 Referring Provider:   Flowsheet Row Cardiac Rehab from 08/21/2023 in [facility] Cardiac and Pulmonary Rehab  Referring Provider [facility]       Initial Encounter Date:  Flowsheet Row Cardiac Rehab from 08/21/2023 in [facility] Cardiac and Pulmonary Rehab  Date 08/21/23       Visit Diagnosis: NSTEMI (non-ST elevation myocardial infarction) [facility])  Patient's Home Medications on Admission:  Current Outpatient Medications:    acetaminophen (TYLENOL) 500 MG tablet, Take 1,000 mg by mouth every 8 (eight) hours as needed for mild pain., Disp: , Rfl:    atorvastatin (LIPITOR) 80 MG tablet, Take 1 tablet (80 mg total) by mouth daily., Disp: 30 tablet, Rfl: 0   azelastine (ASTELIN) 0.1 % nasal spray, USE 1 TO 2 SPRAYS IN EACH NOSTRIL TWICE A DAY, Disp: , Rfl:    calcitRIOL (ROCALTROL) 0.25 MCG capsule, Take 0.25 mcg by mouth daily., Disp: , Rfl:    cetirizine (ZYRTEC) 5 MG tablet, Take 5 mg by mouth daily., Disp: , Rfl:    citalopram (CELEXA) 10 MG tablet, Take 1 tablet (10 mg total) by mouth daily., Disp: 90 tablet, Rfl: 3   clopidogrel (PLAVIX) 75 MG tablet, TAKE 1 TABLET BY MOUTH EVERY DAY, Disp: 90 tablet, Rfl: 1   colchicine 0.6 MG tablet, Take 3 tablets (1.8 mg total) by mouth daily as needed (gout flares)., Disp: , Rfl:    [employer] VITAMIN B12 1000 MCG tablet, TAKE 1 TABLET BY MOUTH EVERY DAY, Disp: 30 tablet, Rfl: 0   EPINEPHrine (EPI-PEN) 0.3 mg/0.3 mL SOAJ injection, Inject into the muscle as needed (anaphylaxis)., Disp: , Rfl:    ergocalciferol (VITAMIN D2) 1.25 MG (50000 UT) capsule, Take 50,000 Units by mouth every 30 (thirty) days., Disp: , Rfl:    ferrous sulfate 325 (65 FE) MG EC tablet, TAKE 1 TABLET BY MOUTH EVERY DAY, Disp: 90 tablet, Rfl: 3   fluticasone (FLONASE) 50 MCG/ACT nasal spray, USE 1 SPRAY IN EACH NOSTRIL EVERY DAY, Disp: 32 g, Rfl: 6   folic acid (FOLVITE) 400  MCG tablet, Take 400 mcg by mouth daily., Disp: , Rfl:    insulin aspart (NOVOLOG) 100 UNIT/ML FlexPen, 10 UNITS IN AM, 30 UNITS AT LUNCH, 30 UNITS AT DINNER, Disp: , Rfl:    insulin glargine (LANTUS) 100 UNIT/ML injection, Inject 0.35 mLs (35 Units total) into the skin at bedtime. This is a decrease from your previous 45 units nightly. (Patient taking differently: Inject 54 Units into the skin at bedtime.), Disp: 10 mL, Rfl: 11   isosorbide mononitrate (IMDUR) 120 MG 24 hr tablet, Take 1 tablet (120 mg total) by mouth 2 (two) times daily., Disp: 60 tablet, Rfl: 0   JARDIANCE 10 MG TABS tablet, Take 10 mg by mouth daily., Disp: , Rfl:    magnesium oxide (MAG-OX) 400 (240 Mg) MG tablet, Take 1 tablet by mouth daily., Disp: , Rfl:    metoprolol succinate (TOPROL-XL) 50 MG 24 hr tablet, Take 1 tablet by mouth daily., Disp: , Rfl:    mometasone (ASMANEX) 220 MCG/ACT inhaler, Inhale 2 puffs into the lungs daily., Disp: , Rfl:    montelukast (SINGULAIR) 10 MG tablet, TAKE 1 TABLET AT BEDTIME, Disp: 90 tablet, Rfl: 3   nitroGLYCERIN (NITRODUR - DOSED IN MG/24 HR) 0.4 mg/hr patch, Place 0.4 mg onto the skin daily., Disp: , Rfl:    nitroGLYCERIN (NITROSTAT) 0.4 MG SL tablet, Place 0.4  mg under the tongue every 5 (five) minutes x 3 doses as needed for chest pain., Disp: , Rfl:    pantoprazole (PROTONIX) 40 MG tablet, Take 1 tablet (40 mg total) by mouth 2 (two) times daily before a meal., Disp: , Rfl:    ranolazine (RANEXA) 1000 MG SR tablet, Take 1 tablet (1,000 mg total) by mouth daily., Disp: 30 tablet, Rfl: 0   sodium bicarbonate 650 MG tablet, Take 650 mg by mouth 2 (two) times daily., Disp: , Rfl:    sodium fluoride (DENTA 5000 PLUS) 1.1 % CREA dental cream, See admin instructions., Disp: , Rfl:    Tiotropium Bromide-Olodaterol (STIOLTO RESPIMAT) 2.5-2.5 MCG/ACT AERS, Inhale 1 each into the lungs daily., Disp: , Rfl:    torsemide (DEMADEX) 20 MG tablet, Take 1 tablet (20 mg total) by mouth daily.  Increase to 1 tablet (20 mg total) by mouth TWICE daily (total daily dose 40 mg) as needed for up to 3 days for increased leg swelling, shortness of breath, weight gain 5+ lbs over 1-2 days. 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, esphageal   Chronic diastolic CHF (congestive heart failure) (HCC)    Colon polyp    Diabetes mellitus without complication (HCC)    Diverticulosis    Gout    Heart disease    Hemangioma    liver   Hyperlipidemia    Hypertension    Myocardial infarct (HCC)    Ocular hypertension    Peripheral vascular disease (HCC)    Skin cancer    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HCC)    Sleep apnea    Vitreoretinal degeneration     Tobacco Use: Social History   Tobacco Use  Smoking Status Former   Current packs/day: 0.00   Types: Cigarettes   Start date: 12/10/1956   Quit date: 12/10/1976   Years since quitting: 47.1  Smokeless Tobacco Never    Labs: Review Flowsheet  More data exists      Latest Ref Rng & Units 07/18/2021 08/15/2022 08/16/2022 04/19/2023 10/31/2023  Labs for ITP Cardiac and Pulmonary Rehab  Cholestrol 0 - 200 mg/dL - - 119  - -  LDL (calc) 0 - 99 mg/dL - - 33  - -  HDL-C >14 mg/dL - - 17  - -  Trlycerides <150 mg/dL - - 782  - -  Hemoglobin A1c 4.8 - 5.6 % 8.3     7.5  - 6.2  7.1     Details       This result is from an external source.          Exercise Target Goals: Exercise Program Goal: Individual exercise prescription set using results from initial 6 min walk test and THRR while considering  patient's activity barriers and safety.   Exercise Prescription Goal: Initial exercise prescription builds to 30-45 minutes a day of aerobic activity, 2-3 days per week.  Home exercise  guidelines will be given to patient during program as part of exercise prescription that the participant will acknowledge.   Education: Aerobic Exercise: - Group verbal and visual presentation on the components of exercise prescription. Introduces F.I.T.T principle from ACSM for exercise prescriptions.  Reviews  F.I.T.T. principles of aerobic exercise including progression. Written material given at graduation. Flowsheet Row Cardiac Rehab from 08/21/2023 in [facility] Cardiac and Pulmonary Rehab  Education need identified 08/21/23       Education: Resistance Exercise: - Group verbal and visual presentation on the components of exercise prescription. Introduces F.I.T.T principle from ACSM for exercise prescriptions  Reviews F.I.T.T. principles of resistance exercise including progression. Written material given at graduation.    Education: Exercise & Equipment Safety: - Individual verbal instruction and demonstration of equipment use and safety with use of the equipment. Flowsheet Row Cardiac Rehab from 08/21/2023 in [facility] Cardiac and Pulmonary Rehab  Date 08/21/23  Educator MB  Instruction Review Code 1- Verbalizes Understanding       Education: Exercise Physiology & General Exercise Guidelines: - Group verbal and written instruction with models to review the exercise physiology of the cardiovascular system and associated critical values. Provides general exercise guidelines with specific guidelines to those with heart or lung disease.    Education: Flexibility, Balance, Mind/Body Relaxation: - Group verbal and visual presentation with interactive activity on the components of exercise prescription. Introduces F.I.T.T principle from ACSM for exercise prescriptions. Reviews F.I.T.T. principles of flexibility and balance exercise training including progression. Also discusses the mind body connection.  Reviews various relaxation techniques to help reduce and manage stress (i.e. Deep breathing,  progressive muscle relaxation, and visualization). Balance handout provided to take home. Written material given at graduation.   Activity Barriers & Risk Stratification:  Activity Barriers & Cardiac Risk Stratification - 08/21/23 1552       Activity Barriers & Cardiac Risk Stratification   Activity Barriers Back Problems;Other (comment);Muscular Weakness    Comments Gout flares occasionally; back problems, but tolerates    Cardiac Risk Stratification High             6 Minute Walk:  6 Minute Walk     Row Name 08/21/23 1551         6 Minute Walk   Phase Initial     Distance 890 feet     Walk Time 6 minutes     # of Rest Breaks 0     MPH 1.69     METS 1.43     RPE 13     Perceived Dyspnea  1     VO2 Peak 4.99     Symptoms Yes (comment)     Comments Fatigue in legs     Resting HR 69 bpm     Resting BP 140/70     Resting Oxygen Saturation  97 %     Exercise Oxygen Saturation  during 6 min walk 99 %     Max Ex. HR 104 bpm     Max Ex. BP 142/58     2 Minute Post BP 140/66              Oxygen Initial Assessment:   Oxygen Re-Evaluation:   Oxygen Discharge (Final Oxygen Re-Evaluation):   Initial Exercise Prescription:  Initial Exercise Prescription - 08/21/23 1500       Date of Initial Exercise RX and Referring Provider   Date 08/21/23    Referring Provider [facility]      Oxygen   Maintain Oxygen Saturation 88% or higher      Treadmill   MPH 1.4    Grade 0    Minutes 15    METs 2.07      Recumbant Bike   Level 1    RPM 50  Watts 15    Minutes 15    METs 1.43      NuStep   Level 1   T4 and T6   SPM 80    Minutes 15    METs 1.43      T5 Nustep   Level 1    SPM 80    Minutes 15    METs 1.43      Prescription Details   Frequency (times per week) 3    Duration Progress to 30 minutes of continuous aerobic without signs/symptoms of physical distress      Intensity   THRR 40-80% of Max Heartrate 96-123    Ratings of Perceived  Exertion 11-13    Perceived Dyspnea 0-4      Progression   Progression Continue to progress workloads to maintain intensity without signs/symptoms of physical distress.      Resistance Training   Training Prescription Yes    Weight 4 lb    Reps 10-15             Perform Capillary Blood Glucose checks as needed.  Exercise Prescription Changes:   Exercise Prescription Changes     Row Name 08/21/23 1500 09/12/23 1600 09/25/23 1300 10/10/23 1500 10/22/23 1400     Response to Exercise   Blood Pressure (Admit) 140/70 110/68 142/84 130/58 132/52   Blood Pressure (Exercise) 142/58 180/62 164/62 142/72 158/62   Blood Pressure (Exit) 140/66 142/60 112/58 118/68 140/64   Heart Rate (Admit) 69 bpm 72 bpm 108 bpm 76 bpm 79 bpm   Heart Rate (Exercise) 104 bpm 105 bpm 108 bpm 100 bpm 111 bpm   Heart Rate (Exit) 72 bpm 84 bpm 83 bpm 74 bpm 81 bpm   Oxygen Saturation (Admit) 97 % -- -- -- --   Oxygen Saturation (Exercise) 99 % -- -- -- --   Oxygen Saturation (Exit) 97 % -- -- -- --   Rating of Perceived Exertion (Exercise) 13 13 15 15 15    Perceived Dyspnea (Exercise) 1 0 0 0 0   Symptoms fatigue in legs CP 3/4- resolved with rest none none none   Comments results -- -- -- --   Duration Progress to 30 minutes of  aerobic without signs/symptoms of physical distress Progress to 30 minutes of  aerobic without signs/symptoms of physical distress Progress to 30 minutes of  aerobic without signs/symptoms of physical distress Progress to 30 minutes of  aerobic without signs/symptoms of physical distress Progress to 30 minutes of  aerobic without signs/symptoms of physical distress   Intensity THRR New THRR unchanged THRR unchanged THRR unchanged THRR unchanged     Progression   Progression Continue to progress workloads to maintain intensity without signs/symptoms of physical distress. Continue to progress workloads to maintain intensity without signs/symptoms of physical distress. Continue  to progress workloads to maintain intensity without signs/symptoms of physical distress. Continue to progress workloads to maintain intensity without signs/symptoms of physical distress. Continue to progress workloads to maintain intensity without signs/symptoms of physical distress.   Average METs 1.43 2.3 2.3 1.72 1.9     Resistance Training   Training Prescription -- Yes Yes Yes Yes   Weight -- 4 lb 4 lb 4 lb 4 lb   Reps -- 10-15 10-15 10-15 10-15     Interval Training   Interval Training -- No No No No     Treadmill   MPH -- 1.3 -- 1.4 --   Grade -- 0 -- 0 --  Minutes -- 15 -- 15 --   METs -- 1.99 -- 2.07 --     Recumbant Bike   Level -- 1 4 -- 3   Watts -- 25 19 -- 19   Minutes -- 15 15 -- 15   METs -- 2.91 2.68 -- 2.68     NuStep   Level -- 4 4 -- --   Minutes -- 15 15 -- --   METs -- -- 2.2 -- --     Arm Ergometer   Level -- -- -- 1.2 1.2   Minutes -- -- -- 15 15   METs -- -- -- 1 1.8     T5 Nustep   Level -- 3 4 4 4    Minutes -- 15 15 15 15    METs -- 2.1 2.1 2.1 2     Biostep-RELP   Level -- -- -- -- 3   Minutes -- -- -- -- 15   METs -- -- -- -- 2     Track   Laps -- -- -- -- 16   Minutes -- -- -- -- 15   METs -- -- -- -- 1.87     Oxygen   Maintain Oxygen Saturation -- 88% or higher 88% or higher 88% or higher 88% or higher    Row Name 10/23/23 1100 11/06/23 0900 12/06/23 0700 12/19/23 1500 12/31/23 1000     Response to Exercise   Blood Pressure (Admit) -- 142/82 122/64 122/62 100/62   Blood Pressure (Exit) -- 110/60 118/60 104/62 110/60   Heart Rate (Admit) -- 76 bpm 78 bpm 76 bpm 77 bpm   Heart Rate (Exercise) -- 105 bpm 107 bpm 107 bpm 107 bpm   Heart Rate (Exit) -- 73 bpm 78 bpm 88 bpm 82 bpm   Rating of Perceived Exertion (Exercise) -- 15 12 13 15    Perceived Dyspnea (Exercise) -- -- -- -- 0   Symptoms -- none none none none   Duration -- Progress to 30 minutes of  aerobic without signs/symptoms of physical distress Progress to 30 minutes of   aerobic without signs/symptoms of physical distress Progress to 30 minutes of  aerobic without signs/symptoms of physical distress Progress to 30 minutes of  aerobic without signs/symptoms of physical distress   Intensity -- THRR unchanged THRR unchanged THRR unchanged THRR unchanged     Progression   Progression -- Continue to progress workloads to maintain intensity without signs/symptoms of physical distress. Continue to progress workloads to maintain intensity without signs/symptoms of physical distress. Continue to progress workloads to maintain intensity without signs/symptoms of physical distress. Continue to progress workloads to maintain intensity without signs/symptoms of physical distress.   Average METs -- 1.99 2.18 1.91 1.73     Resistance Training   Training Prescription -- Yes Yes Yes Yes   Weight -- 4 lb 4 lb 4 lb 4 lb   Reps -- 10-15 10-15 10-15 10-15     Interval Training   Interval Training -- No No No No     Oxygen   Oxygen -- -- -- Continuous --     Recumbant Bike   Level -- 3 3 -- --   Watts -- 20 20 -- --   Minutes -- 15 15 -- --   METs -- 2.71 2.73 -- --     Arm Ergometer   Level -- 1.2 -- -- 1.2   Watts -- -- -- -- 0   Minutes -- 15 -- -- 15  METs -- 1 -- -- 1.6     T5 Nustep   Level -- 3 2 3 3    Minutes -- 15 15 15 15    METs -- 2 1.9 2 2      Biostep-RELP   Level -- -- 3 -- 5   Minutes -- -- 15 -- 15   METs -- -- 2 -- 2     Track   Laps -- 16 20 15 15    Minutes -- 15 15 15 15    METs -- 1.87 2.09 1.82 1.82     Home Exercise Plan   Plans to continue exercise at Home (comment)  walking around his property and exercises from packet at home Home (comment)  walking around his property and exercises from packet at home Home (comment)  walking around his property and exercises from packet at home Home (comment)  walking around his property and exercises from packet at home Home (comment)  walking around his property and exercises from packet at home    Frequency Add 1 additional day to program exercise sessions. Add 1 additional day to program exercise sessions. Add 1 additional day to program exercise sessions. Add 1 additional day to program exercise sessions. Add 1 additional day to program exercise sessions.   Initial Home Exercises Provided 10/23/23 10/23/23 10/23/23 10/23/23 10/23/23     Oxygen   Maintain Oxygen Saturation -- 88% or higher 88% or higher 88% or higher 88% or higher    Row Name 01/15/24 1600             Response to Exercise   Blood Pressure (Admit) 100/62       Blood Pressure (Exit) 110/60       Heart Rate (Admit) 77 bpm       Heart Rate (Exercise) 107 bpm       Heart Rate (Exit) 82 bpm       Rating of Perceived Exertion (Exercise) 15       Perceived Dyspnea (Exercise) 0       Symptoms none       Duration Progress to 30 minutes of  aerobic without signs/symptoms of physical distress       Intensity THRR unchanged         Progression   Progression Continue to progress workloads to maintain intensity without signs/symptoms of physical distress.       Average METs 1.73         Resistance Training   Training Prescription Yes       Weight 4 lb       Reps 10-15         Interval Training   Interval Training No         Arm Ergometer   Level 1.2       Watts 0       Minutes 15       METs 1.6         T5 Nustep   Level 3       Minutes 15       METs 2         Biostep-RELP   Level 5       Minutes 15       METs 2         Track   Laps 15       Minutes 15       METs 1.82         Home Exercise Plan   Plans  to continue exercise at Home (comment)  walking around his property and exercises from packet at home       Frequency Add 1 additional day to program exercise sessions.       Initial Home Exercises Provided 10/23/23         Oxygen   Maintain Oxygen Saturation 88% or higher                Exercise Comments:   Exercise Comments     Row Name 08/26/23 1130 10/09/23 1241          Exercise Comments First full day of exercise!  Patient was oriented to gym and equipment including functions, settings, policies, and procedures.  Patient's individual exercise prescription and treatment plan were reviewed.  All starting workloads were established based on the results of the 6 minute walk test done at initial orientation visit.  The plan for exercise progression was also introduced and progression will be customized based on patient's performance and goals. Recent hospitalization for angina.  Meds increased to pre NSTEMI level.               Exercise Goals and Review:   Exercise Goals     Row Name 08/21/23 1556             Exercise Goals   Increase Physical Activity Yes       Intervention Provide advice, education, support and counseling about physical activity/exercise needs.;Develop an individualized exercise prescription for aerobic and resistive training based on initial evaluation findings, risk stratification, comorbidities and participant's personal goals.       Expected Outcomes Short Term: Attend rehab on a regular basis to increase amount of physical activity.;Long Term: Add in home exercise to make exercise part of routine and to increase amount of physical activity.;Long Term: Exercising regularly at least 3-5 days a week.       Increase Strength and Stamina Yes       Intervention Provide advice, education, support and counseling about physical activity/exercise needs.;Develop an individualized exercise prescription for aerobic and resistive training based on initial evaluation findings, risk stratification, comorbidities and participant's personal goals.       Expected Outcomes Short Term: Increase workloads from initial exercise prescription for resistance, speed, and METs.;Short Term: Perform resistance training exercises routinely during rehab and add in resistance training at home;Long Term: Improve cardiorespiratory fitness, muscular endurance and strength  as measured by increased METs and functional capacity ( )       Able to understand and use rate of perceived exertion (RPE) scale Yes       Intervention Provide education and explanation on how to use RPE scale       Expected Outcomes Short Term: Able to use RPE daily in rehab to express subjective intensity level;Long Term:  Able to use RPE to guide intensity level when exercising independently       Able to understand and use Dyspnea scale Yes       Intervention Provide education and explanation on how to use Dyspnea scale       Expected Outcomes Short Term: Able to use Dyspnea scale daily in rehab to express subjective sense of shortness of breath during exertion;Long Term: Able to use Dyspnea scale to guide intensity level when exercising independently       Knowledge and understanding of Target Heart Rate Range (THRR) Yes       Intervention Provide education and explanation of THRR including how the numbers were predicted  and where they are located for reference       Expected Outcomes Short Term: Able to state/look up THRR;Short Term: Able to use daily as guideline for intensity in rehab;Long Term: Able to use THRR to govern intensity when exercising independently       Able to check pulse independently Yes       Intervention Provide education and demonstration on how to check pulse in carotid and radial arteries.;Review the importance of being able to check your own pulse for safety during independent exercise       Expected Outcomes Short Term: Able to explain why pulse checking is important during independent exercise;Long Term: Able to check pulse independently and accurately       Understanding of Exercise Prescription Yes       Intervention Provide education, explanation, and written materials on patient's individual exercise prescription       Expected Outcomes Short Term: Able to explain program exercise prescription;Long Term: Able to explain home exercise prescription to exercise  independently                Exercise Goals Re-Evaluation :  Exercise Goals Re-Evaluation     Row Name 08/26/23 1130 09/12/23 1648 09/25/23 1401 10/10/23 1539 10/10/23 1540     Exercise Goal Re-Evaluation   Exercise Goals Review Able to understand and use rate of perceived exertion (RPE) scale;Able to understand and use Dyspnea scale;Knowledge and understanding of Target Heart Rate Range (THRR);Understanding of Exercise Prescription Increase Physical Activity;Increase Strength and Stamina;Understanding of Exercise Prescription Increase Physical Activity;Increase Strength and Stamina;Understanding of Exercise Prescription Increase Physical Activity;Increase Strength and Stamina;Understanding of Exercise Prescription Increase Physical Activity;Increase Strength and Stamina;Understanding of Exercise Prescription   Comments Reviewed RPE  and dyspnea scale, THR and program prescription with pt today.  Pt voiced understanding and was given a copy of goals to take home. Mike Decker is off to a great start in the program. He has attended his first 5 session during this review. He has already been able to increase his level on the T4 nustep from 1 to 4. We will continue to monitor his progress in the program. Mike Decker continues to do well in rehab. He has recently been able to signifigantly increase his level on the recumbent bike from level 1 to 4. He has also been able to maintain a consistent intensity on both the T4 and T5 nustep. We will continue to monitor his progress in the program. Mike Decker continues to do well in rehab. Mike Decker continues to do well in rehab. He was only able to attend 3 sessions during this review. In those 3 sessions he was able to maintain a consistent workload. We will continue to monitor his progress in the program.   Expected Outcomes Short: Use RPE daily to regulate intensity. Long: Follow program prescription in THR. Short: Continue to follow current exercise prescription, and  progressively increase workloads. Long: Continue exercise to improve strength and stamina. Short: Continue to follow current exercise prescription, and progressively increase workloads. Long: Continue exercise to improve strength and stamina. -- Short: Continue to follow current exercise prescription, and progressively increase workloads. Long: Continue exercise to improve strength and stamina.    Row Name 10/22/23 1500 10/23/23 1152 11/06/23 0948 11/19/23 1534 12/06/23 0729     Exercise Goal Re-Evaluation   Exercise Goals Review Increase Physical Activity;Increase Strength and Stamina;Understanding of Exercise Prescription Increase Physical Activity;Able to understand and use rate of perceived exertion (RPE) scale;Knowledge and understanding of Target Heart Rate  Range (THRR);Understanding of Exercise Prescription;Increase Strength and Stamina;Able to understand and use Dyspnea scale;Able to check pulse independently Increase Physical Activity;Increase Strength and Stamina;Understanding of Exercise Prescription Increase Physical Activity;Increase Strength and Stamina;Understanding of Exercise Prescription Increase Physical Activity;Increase Strength and Stamina;Understanding of Exercise Prescription   Comments Mike Decker continues to do well in rehab. He has been able to maintain a constant workload on the recumbent bike and T5 nustep. He was also able to add the biostep at level 2, and the track at 16 laps, to his current exercise prescription. We will continue to monitor his progress in the program. Reviewed home exercise with pt today.  Pt plans to walk around property and do weight/band exercises from our packet for exercise. Pt plans to add 1 additional day of home exercise. Reviewed THR, pulse, RPE, sign and symptoms, pulse oximetery and when to call 911 or MD.  Also discussed weather considerations and indoor options.  Pt voiced understanding. Mike Decker is doing well in rehab. He continues to walk 16 laps on the  track and has stayed consistent with his workloads on the arm crank and recumbent bike. He did see a decrease from level 4 to level 3 on the T5 nustep. We will continue to monitor his progress in the program. Mike Decker has not attended rehab since 11/18. He has been in contact with [place], and we hope he returns when appropriate. We will continue to monitor his progress in the program. Mike Decker is doing well although he has only attended rehab twice since the last review. However, he was able to increase to 20 laps walked on the track. He also continues to work at level 3 on both the recumbent bike and the biostep. We will continue to monitor his progress in the program.   Expected Outcomes Short: Continue to follow current exercise prescription, and progressively increase workloads. Long: Continue exercise to improve strength and stamina. Short: Add 1 additional day of home exercise involving walking and weight/band exercises. Long: Continue to exercise at home independently. Short: Increase back up to level 4 on the T5 nustep. Long: Continue exercise to improve strength and stamina. Short: Return to the program. Long: Continue exercise to improve strength and stamina. Short: Attend rehab more regularly. Long: Continue exercise to improve strength and stamina.    Row Name 12/18/23 1309 12/19/23 1544 12/31/23 1031 01/15/24 1620 01/20/24 1147     Exercise Goal Re-Evaluation   Exercise Goals Review Increase Physical Activity Increase Physical Activity;Increase Strength and Stamina;Understanding of Exercise Prescription Increase Physical Activity;Increase Strength and Stamina;Understanding of Exercise Prescription Increase Physical Activity;Increase Strength and Stamina;Understanding of Exercise Prescription Increase Physical Activity;Increase Strength and Stamina;Understanding of Exercise Prescription   Comments Mike Decker is walking his dog at least 3-4 times a day. He has not stated walking longer time and distance yet. He  has been having frequent bouts of angina that had been happening when he exerted himself.  He is now wearing a nitroglycerin patch and his angina symptoms have decreased.  He will try walking longet time at home.  suggested he walk around in his house. Mike Decker has returned to rehab and is doing well. He was able to return to level 3 on the T5 nustep. He was also able to complete 15 laps in 15 minutes. We will continue to monitor his progress in the program. Mike Decker is doing well in rehab. he was able to increase his level on the biostep from level 3 to level 5. He was able to do 15 laps  on the track and maintain level 3 on the T5 nustep. We will continue to monitor his progress in the program. Mike Decker has not attended since the last review. He hopes to return this week, the week of 2/3. Once he returns he is due for his post . Mike Decker reports that he is doing some chair exercises and stretches at home on off days of rehab. He states that this helps with his range of motion and strength.   Expected Outcomes STG Walk more than the times with his dog. LTG Mike Decker is walking at least 10 -15 min at home several days a week Short: attend rehab more regularly. Long: Continue exercise to improve strength and stamina. Short: Continue to follow current exercise prescription. Long: Continue exercise to improve strength and stamina. Short: Return to rehab and improve on post . Long: Continue exercise to improve strength and stamina and graduate from program. Short: continue to do exercises at home and work on a plan for what he is going to do for exercise once he graduates from rehab. Long: maintain independent exercise routine.            Discharge Exercise Prescription (Final Exercise Prescription Changes):  Exercise Prescription Changes - 01/15/24 1600       Response to Exercise   Blood Pressure (Admit) 100/62    Blood Pressure (Exit) 110/60    Heart Rate (Admit) 77 bpm    Heart Rate (Exercise) 107 bpm     Heart Rate (Exit) 82 bpm    Rating of Perceived Exertion (Exercise) 15    Perceived Dyspnea (Exercise) 0    Symptoms none    Duration Progress to 30 minutes of  aerobic without signs/symptoms of physical distress    Intensity THRR unchanged      Progression   Progression Continue to progress workloads to maintain intensity without signs/symptoms of physical distress.    Average METs 1.73      Resistance Training   Training Prescription Yes    Weight 4 lb    Reps 10-15      Interval Training   Interval Training No      Arm Ergometer   Level 1.2    Watts 0    Minutes 15    METs 1.6      T5 Nustep   Level 3    Minutes 15    METs 2      Biostep-RELP   Level 5    Minutes 15    METs 2      Track   Laps 15    Minutes 15    METs 1.82      Home Exercise Plan   Plans to continue exercise at Home (comment)   walking around his property and exercises from packet at home   Frequency Add 1 additional day to program exercise sessions.    Initial Home Exercises Provided 10/23/23      Oxygen   Maintain Oxygen Saturation 88% or higher             Nutrition:  Target Goals: Understanding of nutrition guidelines, daily intake of sodium 1500mg , cholesterol 200mg , calories 30% from fat and 7% or less from saturated fats, daily to have 5 or more servings of fruits and vegetables.  Education: All About Nutrition: -Group instruction provided by verbal, written material, interactive activities, discussions, models, and posters to present general guidelines for heart healthy nutrition including fat, fiber, MyPlate, the role of sodium in heart healthy nutrition,  utilization of the nutrition label, and utilization of this knowledge for meal planning. Follow up email sent as well. Written material given at graduation. Flowsheet Row Cardiac Rehab from 08/21/2023 in [facility] Cardiac and Pulmonary Rehab  Education need identified 08/21/23       Biometrics:  Pre Biometrics - 08/21/23 1557        Pre Biometrics   Height 5' 6.2" (1.681 m)    Weight 188 lb 9.6 oz (85.5 kg)    Waist Circumference 45.5 inches    Hip Circumference 46.5 inches    Waist to Hip Ratio 0.98 %    BMI (Calculated) 30.27    Single Leg Stand 4 seconds              Nutrition Therapy Plan and Nutrition Goals:  Nutrition Therapy & Goals - 08/21/23 1559       Personal Nutrition Goals   Nutrition Goal Will meet with RD in the next 2 weeks      Intervention Plan   Intervention Prescribe, educate and counsel regarding individualized specific dietary modifications aiming towards targeted core components such as weight, hypertension, lipid management, diabetes, heart failure and other comorbidities.;Nutrition handout(s) given to patient.    Expected Outcomes Short Term Goal: Understand basic principles of dietary content, such as calories, fat, sodium, cholesterol and nutrients.;Short Term Goal: A plan has been developed with personal nutrition goals set during dietitian appointment.;Long Term Goal: Adherence to prescribed nutrition plan.             Nutrition Assessments:  MEDIFICTS Score Key: >=70 Need to make dietary changes  40-70 Heart Healthy Diet <= 40 Therapeutic Level Cholesterol Diet  Flowsheet Row Cardiac Rehab from 08/21/2023 in [facility] Cardiac [facility] Rehab  Picture Your Plate Total Score on Admission 69      Picture Your Plate Scores: <16 Unhealthy dietary pattern with much room for improvement. 41-50 Dietary pattern unlikely to meet recommendations for good health and room for improvement. 51-60 More healthful dietary pattern, with some room for improvement.  >60 Healthy dietary pattern, although there may be some specific behaviors that could be improved.    Nutrition Goals Re-Evaluation:  Nutrition Goals Re-Evaluation     Row Name 12/18/23 1318 01/20/24 1158           Goals   Nutrition Goal -- Patient deferred RD appointment at this time.      Comment has not  meet with RD this time in program. He does his best to eat healthy.  He was out of town this past week and they ate foods he does not normally eat. He has fluid retention and is working on taking his diurectic to get back to his ususal weight --      Expected Outcome STG get back to baseline weight watch sodium intake, try better food choices if possible when on vacation.   LTG  Maintain healthy eating habits --               Nutrition Goals Discharge (Final Nutrition Goals Re-Evaluation):  Nutrition Goals Re-Evaluation - 01/20/24 1158       Goals   Nutrition Goal Patient deferred RD appointment at this time.             Psychosocial: Target Goals: Acknowledge presence or absence of significant depression and/or stress, maximize coping skills, provide positive support system. Participant is able to verbalize types and ability to use techniques and skills needed for reducing stress and depression.  Education: Stress, Anxiety, and Depression - Group verbal and visual presentation to define topics covered.  Reviews how body is impacted by stress, anxiety, and depression.  Also discusses healthy ways to reduce stress and to treat/manage anxiety and depression.  Written material given at graduation.   Education: Sleep Hygiene -Provides group verbal and written instruction about how sleep can affect your health.  Define sleep hygiene, discuss sleep cycles and impact of sleep habits. Review good sleep hygiene tips.    Initial Review & Psychosocial Screening:  Initial Psych Review & Screening - 08/08/23 1447       Initial Review   Current issues with Current Sleep Concerns      Family Dynamics   Good Support System? Yes   wife     Screening Interventions   Interventions To provide support and resources with identified psychosocial needs    Expected Outcomes Short Term goal: Utilizing psychosocial counselor, staff and physician to assist with identification of specific Stressors  or current issues interfering with healing process. Setting desired goal for each stressor or current issue identified.;Long Term Goal: Stressors or current issues are controlled or eliminated.;Short Term goal: Identification and review with participant of any Quality of Life or Depression concerns found by scoring the questionnaire.;Long Term goal: The participant improves quality of Life and PHQ9 Scores as seen by post scores and/or verbalization of changes             Quality of Life Scores:   Quality of Life - 08/21/23 1558       Quality of Life   Select Quality of Life      Quality of Life Scores   Health/Function Pre 12.53 %    Socioeconomic Pre 24.58 %    Psych/Spiritual Pre 20.14 %    Family Pre 25.2 %    GLOBAL Pre 18.26 %            Scores of 19 and below usually indicate a poorer quality of life in these areas.  A difference of  2-3 points is a clinically meaningful difference.  A difference of 2-3 points in the total score of the Quality of Life Index has been associated with significant improvement in overall quality of life, self-image, physical symptoms, and general health in studies assessing change in quality of life.  PHQ-9: Review Flowsheet  More data exists      01/20/2024 08/21/2023 10/29/2022 12/18/2021 09/15/2021  Depression screen PHQ 2/9  Decreased Interest 0 0 0 0 0  Down, Depressed, Hopeless 0 0 0 0 0  PHQ - 2 Score 0 0 0 0 0  Altered sleeping 2 3 - - -  Tired, decreased energy 0 2 - - -  Change in appetite 0 0 - - -  Feeling bad or failure about yourself  0 0 - - -  Trouble concentrating 0 0 - - -  Moving slowly or fidgety/restless 0 0 - - -  Suicidal thoughts 0 0 - - -  PHQ-9 Score 2 5 - - -  Difficult doing work/chores Not difficult at all Not difficult at all - - -   Interpretation of Total Score  Total Score Depression Severity:  1-4 = Minimal depression, 5-9 = Mild depression, 10-14 = Moderate depression, 15-19 = Moderately severe  depression, 20-27 = Severe depression   Psychosocial Evaluation and Intervention:  Psychosocial Evaluation - 08/08/23 1513       Psychosocial Evaluation & Interventions   Interventions Relaxation education;Encouraged to exercise with  the program and follow exercise prescription    Comments Gleen "Mike Decker" is coming to cardiac rehab post NSTEMI. 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 He has no barriers to attending the program. He wants to improve his strength and become physically able and comfortable walking around. He has some sleep concerns but stress seems to be under control. He has a good support system at home with his wife. Mike Decker is looking forward to starting rehab.    Expected Outcomes Short: Attend cardiac rehab for education and exercise.  Long: [place] and maintain positive self care habits    Continue Psychosocial Services  Follow up required by staff             Psychosocial Re-Evaluation:  Psychosocial Re-Evaluation     Row Name 12/18/23 1313 01/20/24 1150           Psychosocial Re-Evaluation   Current issues with None Identified None Identified      Comments Mike Decker has been doing well without any concerns. His angina symptoms are better controlled after he started wearing the nitro patch prescribed in Dec. He recently was put of town with his family and did fine. Mike Decker's PHQ9 depression screening was re-done. His score went down from from a 5 to a 2, which shows improvement. He states he has some stress in his life about many health conditions he is managing. 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 He  continues to manage chest pain with his nitrol patch and sublingual nitro. No changes in this, reports that these are normal symptoms for him that he mangages with his doctor.      Expected Outcomes STG  COntinue to monitor health and follow MD prescibed activities and meds. Attend scheduled program sessions   LTG Maintainence of symptoms with meds and activities Short: have a follow up conversation with his doctor about abnmoral pet scan results. Long: maintain good mental health habits.      Interventions Encouraged to attend Cardiac Rehabilitation for the exercise Encouraged to attend Cardiac Rehabilitation for the exercise      Continue Psychosocial Services  Follow up required by staff Follow up required by staff               Psychosocial Discharge (Final Psychosocial Re-Evaluation):  Psychosocial Re-Evaluation - 01/20/24 1150       Psychosocial Re-Evaluation   Current issues with None Identified    Comments Mike Decker's PHQ9 depression screening was re-done. His score went down from from a 5 to a 2, which shows improvement. He states he has some stress in his life about many health conditions he is managing. 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 He continues to manage chest pain with his nitrol patch and sublingual nitro. No changes in this, reports that these are normal symptoms for him that he mangages with his doctor.    Expected Outcomes Short: have a follow up conversation with his doctor about abnmoral pet scan results. Long: maintain good  mental health habits.    Interventions Encouraged to attend Cardiac Rehabilitation for the exercise    Continue Psychosocial Services  Follow up required by staff             Vocational Rehabilitation: Provide vocational rehab assistance to qualifying candidates.   Vocational Rehab Evaluation & Intervention:  Vocational Rehab  - 08/08/23 1505       Initial Vocational Rehab Evaluation & Intervention   Assessment shows need for Vocational Rehabilitation No             Education: Education Goals: Education classes will be provided on a variety of topics geared toward better understanding of heart health and risk factor modification. Participant will state understanding/return demonstration of topics presented as noted by education test scores.  Learning Barriers/Preferences:  Learning Barriers/Preferences - 08/08/23 1452       Learning Barriers/Preferences   Learning Barriers None    Learning Preferences None             General Cardiac Education Topics:  AED/CPR: - Group verbal and written instruction with the use of models to demonstrate the basic use of the AED with the basic ABC's of resuscitation.   Anatomy and Cardiac Procedures: - Group verbal and visual presentation and models provide information about basic cardiac anatomy and function. Reviews the testing methods done to diagnose heart disease and the outcomes of the test results. Describes the treatment choices: Medical Management, Angioplasty, or Coronary Bypass Surgery for treating various heart conditions including Myocardial Infarction, Angina, Valve Disease, and Cardiac Arrhythmias.  Written material given at graduation.   Medication Safety: - Group verbal and visual instruction to review commonly prescribed medications for heart and lung disease. Reviews the medication, class of the drug, and side effects. Includes the steps to properly store meds and maintain the prescription regimen.  Written material given at graduation.   Intimacy: - Group verbal instruction through game format to discuss how heart and lung disease can affect sexual intimacy. Written material given at graduation..   Know Your Numbers and Heart Failure: - Group verbal and visual instruction to discuss disease risk factors for cardiac and pulmonary disease  and treatment options.  Reviews associated critical values for Overweight/Obesity, Hypertension, Cholesterol, and Diabetes.  Discusses basics of heart failure: signs/symptoms and treatments.  Introduces Heart Failure Zone chart for action plan for heart failure.  Written material given at graduation.   Infection Prevention: - Provides verbal and written material to individual with discussion of infection control including proper hand washing and proper equipment cleaning during exercise session. Flowsheet Row Cardiac Rehab from 08/21/2023 in [facility] Cardiac and Pulmonary Rehab  Date 08/21/23  Educator MB  Instruction Review Code 1- Verbalizes Understanding       Falls Prevention: - Provides verbal and written material to individual with discussion of falls prevention and safety. Flowsheet Row Cardiac Rehab from 08/21/2023 in [facility] Cardiac and Pulmonary Rehab  Date 08/21/23  Educator MB  Instruction Review Code 1- Verbalizes Understanding       Other: -Provides group and verbal instruction on various topics (see comments)   Knowledge Questionnaire Score:  Knowledge Questionnaire Score - 08/21/23 1603       Knowledge Questionnaire Score   Pre Score 23/26             Core Components/Risk Factors/Patient Goals at Admission:  Personal Goals and Risk Factors at Admission - 08/21/23 1603       Core Components/Risk Factors/Patient Goals on Admission  Weight Management Yes;Weight Loss    Intervention Weight Management: Develop a combined nutrition and exercise program designed to reach desired caloric intake, while maintaining appropriate intake of nutrient and fiber, sodium and fats, and appropriate energy expenditure required for the weight goal.;Weight Management: Provide education and appropriate resources to help participant work on and attain dietary goals.;Weight Management/Obesity: Establish reasonable short term and long term weight goals.    Admit Weight 188 lb 9.6 oz  (85.5 kg)    Goal Weight: Short Term 178 lb (80.7 kg)    Goal Weight: Long Term 173 lb (78.5 kg)    Expected Outcomes Short Term: Continue to assess and modify interventions until short term weight is achieved;Long Term: Adherence to nutrition and physical activity/exercise program aimed toward attainment of established weight goal;Weight Maintenance: Understanding of the daily nutrition guidelines, which includes 25-35% calories from fat, 7% or less cal from saturated fats, less than 200mg  cholesterol, less than 1.5gm of sodium, & 5 or more servings of fruits and vegetables daily;Understanding recommendations for meals to include 15-35% energy as protein, 25-35% energy from fat, 35-60% energy from carbohydrates, less than 200mg  of dietary cholesterol, 20-35 gm of total fiber daily;Weight Loss: Understanding of general recommendations for a balanced deficit meal plan, which promotes 1-2 lb weight loss per week and includes a negative energy balance of [phone] kcal/d;Understanding of distribution of calorie intake throughout the day with the consumption of 4-5 meals/snacks    Improve shortness of breath with ADL's Yes    Intervention Provide education, individualized exercise plan and daily activity instruction to help decrease symptoms of SOB with activities of daily living.    Expected Outcomes Short Term: Improve cardiorespiratory fitness to achieve a reduction of symptoms when performing ADLs;Long Term: Be able to perform more ADLs without symptoms or delay the onset of symptoms    Diabetes Yes    Intervention Provide education about signs/symptoms and action to take for hypo/hyperglycemia.;Provide education about proper nutrition, including hydration, and aerobic/resistive exercise prescription along with prescribed medications to achieve blood glucose in normal ranges: Fasting glucose 65-99 mg/dL    Expected Outcomes Short Term: Participant verbalizes understanding of the signs/symptoms and immediate  care of hyper/hypoglycemia, proper foot care and importance of medication, aerobic/resistive exercise and nutrition plan for blood glucose control.;Long Term: Attainment of HbA1C < 7%.    Heart Failure Yes    Intervention Provide a combined exercise and nutrition program that is supplemented with education, support and counseling about heart failure. Directed toward relieving symptoms such as shortness of breath, decreased exercise tolerance, and extremity edema.    Expected Outcomes Improve functional capacity of life;Short term: Attendance in program 2-3 days a week with increased exercise capacity. Reported lower sodium intake. Reported increased fruit and vegetable intake. Reports medication compliance.;Short term: Daily weights obtained and reported for increase. Utilizing diuretic protocols set by physician.;Long term: Adoption of self-care skills and reduction of barriers for early signs and symptoms recognition and intervention leading to self-care maintenance.    Hypertension Yes    Intervention Provide education on lifestyle modifcations including regular physical activity/exercise, weight management, moderate sodium restriction and increased consumption of fresh fruit, vegetables, and low fat dairy, alcohol moderation, and smoking cessation.;Monitor prescription use compliance.    Expected Outcomes Short Term: Continued assessment and intervention until BP is < 140/65mm HG in hypertensive participants. < 130/71mm HG in hypertensive participants with diabetes, heart failure or chronic kidney disease.;Long Term: Maintenance of blood pressure at goal levels.    Lipids  Yes    Intervention Provide education and support for participant on nutrition & aerobic/resistive exercise along with prescribed medications to achieve LDL 70mg , HDL >40mg .    Expected Outcomes Short Term: Participant states understanding of desired cholesterol values and is compliant with medications prescribed. Participant is  following exercise prescription and nutrition guidelines.;Long Term: Cholesterol controlled with medications as prescribed, with individualized exercise RX and with personalized nutrition plan. Value goals: LDL < 70mg , HDL > 40 mg.             Education:Diabetes - Individual verbal and written instruction to review signs/symptoms of diabetes, desired ranges of glucose level fasting, after meals and with exercise. Acknowledge that pre and post exercise glucose checks will be done for 3 sessions at entry of program. Flowsheet Row Cardiac Rehab from 08/21/2023 in [facility] Cardiac and Pulmonary Rehab  Date 08/21/23  Educator MB  Instruction Review Code 1- Verbalizes Understanding       Core Components/Risk Factors/Patient Goals Review:   Goals and Risk Factor Review     Row Name 12/18/23 1320 01/20/24 1154           Core Components/Risk Factors/Patient Goals Review   Personal Goals Review Weight Management/Obesity;Improve shortness of breath with ADL's;Diabetes;Hypertension;Lipids Diabetes;Hypertension;Weight Management/Obesity      Review Mike Decker has been out of town and has gained weight from fluid retention, he is taking his diurectics as prescribed., he has states that the exercise is helping his breathing, seeing improvements with movement of less shortness of breath. HIs last A1C was 7.1%, he is weighing himself daily to watch for increases in weight. He has a daily duirectic to take. His BP after exercise is 110-140/60, pre exercise 122-142/60-80. He is back to workon consistent exercise plan, continue to take his meds as prescribed and continue to monitor his daily blood sugar levels.   Last drawn  lipid was in July , triglycerides were elevated,so no LDL result. Mike Decker reports that his blood sugars have been a little higher then usual after a cortisone injection, but he is monitoring this consistently. He also takes all BP meds and monitors his BP at home. He also monitors weight and looks  for fluid retention and states this has been fairly consistent.      Expected Outcomes STG Continue to attend sessions, work on adding exericse routine at home. Watch sodium intake and continue taking prescribed medications  LTG maintianing a healthy lifestyle after discharge Short: continue to monitor BP and weight at home. follow up with doctor if blood sugars continue to run higher then usual. Long: control cardiac risk factors.               Core Components/Risk Factors/Patient Goals at Discharge (Final Review):   Goals and Risk Factor Review - 01/20/24 1154       Core Components/Risk Factors/Patient Goals Review   Personal Goals Review Diabetes;Hypertension;Weight Management/Obesity    Review Mike Decker reports that his blood sugars have been a little higher then usual after a cortisone injection, but he is monitoring this consistently. He also takes all BP meds and monitors his BP at home. He also monitors weight and looks for fluid retention and states this has been fairly consistent.    Expected Outcomes Short: continue to monitor BP and weight at home. follow up with doctor if blood sugars continue to run higher then usual. Long: control cardiac risk factors.             ITP Comments:  ITP Comments  Row Name 08/08/23 1510 08/21/23 1550 08/26/23 1130 09/11/23 1009 10/09/23 1240   ITP Comments Initial phone call completed. Dx can be found in [facility] 7/12. EP orientation scheduled for 9/11 at 1:30 pm. Completed and gym orientation. Initial ITP created and sent for review to Dr. Daniel Nones. First full day of exercise!  Patient was oriented to gym and equipment including functions, settings, policies, and procedures.  Patient's individual exercise prescription and treatment plan were reviewed.  All starting workloads were established based on the results of the 6 minute walk test done at initial orientation visit.  The plan for exercise progression was also introduced and progression  will be customized based on patient's performance and goals. 30 Day review completed. Medical Director ITP review done, changes made as directed, and signed approval by Medical Director.    new to program 30 Day review completed. Medical Director ITP review done, changes made as directed, and signed approval by Medical Director.    Row Name 10/09/23 1241 10/30/23 1137 11/19/23 1248 11/27/23 0957 12/25/23 1305   ITP Comments Recent hospitalization for angina.  Meds increased to pre NSTEMI level. 30 Day review completed. Medical Director ITP review done, changes made as directed, and signed approval by Medical Director. Mike Decker's wife called to inform staff that he was in the emergency department last night with chest pain. He is following up with Dr. Juliann Pares and plans to be out the rest of the week. They will call about next week if he needs to be absent. 30 Day review completed. Medical Director ITP review done, changes made as directed, and signed approval by Medical Director. 30 Day review completed. Medical Director ITP review done, changes made as directed, and signed approval by Medical Director.    Row Name 01/22/24 1120 01/22/24 1123         ITP Comments Mike Decker arrived to day stating he almost fainted when he arrived, found a chair and sat down,  he came to program after he felt better.  BP 160/82.   He stated he just did not feel well.  called his cardiologist and they were able to seehim in 10 minutes.  Mike Decker was transported to cardiologist office via wheelchair. 30 Day review completed. Medical Director ITP review done, changes made as directed, and signed approval by Medical Director.               Comments:

## 2024-01-24 DIAGNOSIS — I252 Old myocardial infarction: Secondary | ICD-10-CM | POA: Diagnosis not present

## 2024-01-24 DIAGNOSIS — I214 Non-ST elevation (NSTEMI) myocardial infarction: Secondary | ICD-10-CM

## 2024-01-24 DIAGNOSIS — Z48812 Encounter for surgical aftercare following surgery on the circulatory system: Secondary | ICD-10-CM | POA: Diagnosis not present

## 2024-01-24 NOTE — Progress Notes (Signed)
Daily Session Note  Patient Details  Name: Mike Decker MRN: [id number] Date of Birth: 11-Apr-1941 Referring Provider:   Flowsheet Row Cardiac Rehab from 08/21/2023 in [facility] Cardiac and Pulmonary Rehab  Referring Provider [facility]       Encounter Date: 01/24/2024  Check In:  Session Check In - 01/24/24 1116       Check-In   Supervising physician immediately available to respond to emergencies See telemetry face sheet for immediately available ER MD    Location [facility]-Cardiac & Pulmonary Rehab    Staff Present Kelton Pillar RN,BSN,MPA;Noah Tickle, BS, Exercise Physiologist;Joseph Hollace Kinnier    Virtual Visit No    Medication changes reported     No    Fall or balance concerns reported    No    Warm-up and Cool-down Performed on first and last piece of equipment    Resistance Training Performed Yes    VAD Patient? No    PAD/SET Patient? No      Pain Assessment   Currently in Pain? No/denies                Social History   Tobacco Use  Smoking Status Former   Current packs/day: 0.00   Types: Cigarettes   Start date: 12/10/1956   Quit date: 12/10/1976   Years since quitting: 47.1  Smokeless Tobacco Never    Goals Met:  Independence with exercise equipment Exercise tolerated well No report of concerns or symptoms today Strength training completed today  Goals Unmet:  Not Applicable  Comments: Pt able to follow exercise prescription today without complaint.  Will continue to monitor for progression.    Dr. Bethann Punches is Medical Director for [facility] Cardiac Rehabilitation.  Dr. Vida Rigger is Medical Director for [facility] Pulmonary Rehabilitation.

## 2024-01-27 ENCOUNTER — Encounter: Payer: Medicare Other | Admitting: *Deleted

## 2024-01-27 VITALS — Ht 66.2 in | Wt 188.9 lb

## 2024-01-27 DIAGNOSIS — I214 Non-ST elevation (NSTEMI) myocardial infarction: Secondary | ICD-10-CM

## 2024-01-27 DIAGNOSIS — Z48812 Encounter for surgical aftercare following surgery on the circulatory system: Secondary | ICD-10-CM | POA: Diagnosis not present

## 2024-01-27 DIAGNOSIS — I252 Old myocardial infarction: Secondary | ICD-10-CM | POA: Diagnosis not present

## 2024-01-27 NOTE — Patient Instructions (Signed)
Discharge Patient Instructions  Patient Details  Name: Mike Decker MRN: [id number] Date of Birth: 1941/08/01 Referring Provider:  [name], MD   Number of Visits: 26  Reason for Discharge:  Patient reached a stable level of exercise. Patient independent in their exercise. Patient has met program and personal goals.   Diagnosis:  NSTEMI (non-ST elevation myocardial infarction) [facility])  Initial Exercise Prescription:  Initial Exercise Prescription - 08/21/23 1500       Date of Initial Exercise RX and Referring Provider   Date 08/21/23    Referring Provider [place]      Oxygen   Maintain Oxygen Saturation 88% or higher      Treadmill   MPH 1.4    Grade 0    Minutes 15    METs 2.07      Recumbant Bike   Level 1    RPM 50    Watts 15    Minutes 15    METs 1.43      NuStep   Level 1   T4 and T6   SPM 80    Minutes 15    METs 1.43      T5 Nustep   Level 1    SPM 80    Minutes 15    METs 1.43      Prescription Details   Frequency (times per week) 3    Duration Progress to 30 minutes of continuous aerobic without signs/symptoms of physical distress      Intensity   THRR 40-80% of Max Heartrate 96-123    Ratings of Perceived Exertion 11-13    Perceived Dyspnea 0-4      Progression   Progression Continue to progress workloads to maintain intensity without signs/symptoms of physical distress.      Resistance Training   Training Prescription Yes    Weight 4 lb    Reps 10-15             Discharge Exercise Prescription (Final Exercise Prescription Changes):  Exercise Prescription Changes - 01/15/24 1600       Response to Exercise   Blood Pressure (Admit) 100/62    Blood Pressure (Exit) 110/60    Heart Rate (Admit) 77 bpm    Heart Rate (Exercise) 107 bpm    Heart Rate (Exit) 82 bpm    Rating of Perceived Exertion (Exercise) 15    Perceived Dyspnea (Exercise) 0    Symptoms none    Duration Progress to 30 minutes of  aerobic without  signs/symptoms of physical distress    Intensity THRR unchanged      Progression   Progression Continue to progress workloads to maintain intensity without signs/symptoms of physical distress.    Average METs 1.73      Resistance Training   Training Prescription Yes    Weight 4 lb    Reps 10-15      Interval Training   Interval Training No      Arm Ergometer   Level 1.2    Watts 0    Minutes 15    METs 1.6      T5 Nustep   Level 3    Minutes 15    METs 2      Biostep-RELP   Level 5    Minutes 15    METs 2      Track   Laps 15    Minutes 15    METs 1.82      Home Exercise Plan  Plans to continue exercise at Home (comment)   walking around his property and exercises from packet at home   Frequency Add 1 additional day to program exercise sessions.    Initial Home Exercises Provided 10/23/23      Oxygen   Maintain Oxygen Saturation 88% or higher             Functional Capacity:  6 Minute Walk     Row Name 08/21/23 1551 01/27/24 1135       6 Minute Walk   Phase Initial Discharge    Distance 890 feet 980 feet    Distance % Change -- 10 %    Distance Feet Change -- 90 ft    Walk Time 6 minutes 6 minutes    # of Rest Breaks 0 0    MPH 1.69 1.85    METS 1.43 1.71    RPE 13 16    Perceived Dyspnea  1 3    VO2 Peak 4.99 5.98    Symptoms Yes (comment) Yes (comment)    Comments Fatigue in legs Slight dizziness, chest tightness 4/10 resolved after rest    Resting HR 69 bpm 74 bpm    Resting BP 140/70 132/80    Resting Oxygen Saturation  97 % 96 %    Exercise Oxygen Saturation  during 6 min walk 99 % 98 %    Max Ex. HR 104 bpm 113 bpm    Max Ex. BP 142/58 146/60    2 Minute Post BP 140/66 --             Nutrition & Weight - Outcomes:  Pre Biometrics - 08/21/23 1557       Pre Biometrics   Height 5' 6.2" (1.681 m)    Weight 188 lb 9.6 oz (85.5 kg)    Waist Circumference 45.5 inches    Hip Circumference 46.5 inches    Waist to Hip Ratio  0.98 %    BMI (Calculated) 30.27    Single Leg Stand 4 seconds             Post Biometrics - 01/27/24 1137        Post  Biometrics   Height 5' 6.2" (1.681 m)    Weight 188 lb 14.4 oz (85.7 kg)    Waist Circumference 44.5 inches    Hip Circumference 41.5 inches    Waist to Hip Ratio 1.07 %    BMI (Calculated) 30.32    Single Leg Stand 2 seconds             Goals reviewed with patient; copy given to patient.

## 2024-01-27 NOTE — Progress Notes (Signed)
Daily Session Note  Patient Details  Name: Mike Decker MRN: [id number] Date of Birth: Mar 27, 1941 Referring Provider:   Flowsheet Row Cardiac Rehab from 08/21/2023 in [facility] Cardiac and Pulmonary Rehab  Referring Provider [facility]       Encounter Date: 01/27/2024  Check In:  Session Check In - 01/27/24 1133       Check-In   Supervising physician immediately available to respond to emergencies See telemetry face sheet for immediately available ER MD    Location [facility]-Cardiac & Pulmonary Rehab    Staff Present Cora Collum, RN, BSN, CCRP;Meredith Jewel Baize RN,BSN;Kelly Camden BS, ACSM CEP, Exercise Physiologist;Maxon Conetta BS, Exercise Physiologist    Virtual Visit No    Medication changes reported     No    Fall or balance concerns reported    No    Warm-up and Cool-down Performed on first and last piece of equipment    Resistance Training Performed Yes    VAD Patient? No    PAD/SET Patient? No      Pain Assessment   Currently in Pain? No/denies              6 Minute Walk     Row Name 08/21/23 1551 01/27/24 1135       6 Minute Walk   Phase Initial Discharge    Distance 890 feet 980 feet    Distance % Change -- 10 %    Distance Feet Change -- 90 ft    Walk Time 6 minutes 6 minutes    # of Rest Breaks 0 0    MPH 1.69 1.85    METS 1.43 1.71    RPE 13 16    Perceived Dyspnea  1 3    VO2 Peak 4.99 5.98    Symptoms Yes (comment) Yes (comment)    Comments Fatigue in legs Slight dizziness, chest tightness 4/10 resolved after rest    Resting HR 69 bpm 74 bpm    Resting BP 140/70 132/80    Resting Oxygen Saturation  97 % 96 %    Exercise Oxygen Saturation  during 6 min walk 99 % 98 %    Max Ex. HR 104 bpm 113 bpm    Max Ex. BP 142/58 146/60    2 Minute Post BP 140/66 --                Social History   Tobacco Use  Smoking Status Former   Current packs/day: 0.00   Types: Cigarettes   Start date: 12/10/1956   Quit date: 12/10/1976   Years since  quitting: 47.1  Smokeless Tobacco Never    Goals Met:  Independence with exercise equipment Exercise tolerated well No report of concerns or symptoms today  Goals Unmet:  Not Applicable  Comments: Pt able to follow exercise prescription today without complaint.  Will continue to monitor for progression.    Dr. Bethann Punches is Medical Director for [facility] Cardiac Rehabilitation.  Dr. Vida Rigger is Medical Director for [facility] Pulmonary Rehabilitation.

## 2024-01-30 ENCOUNTER — Ambulatory Visit: Payer: Medicare Other | Admitting: Physician Assistant

## 2024-02-03 ENCOUNTER — Encounter: Payer: Medicare Other | Admitting: *Deleted

## 2024-02-03 DIAGNOSIS — I214 Non-ST elevation (NSTEMI) myocardial infarction: Secondary | ICD-10-CM

## 2024-02-03 DIAGNOSIS — Z48812 Encounter for surgical aftercare following surgery on the circulatory system: Secondary | ICD-10-CM | POA: Diagnosis not present

## 2024-02-03 DIAGNOSIS — I252 Old myocardial infarction: Secondary | ICD-10-CM | POA: Diagnosis not present

## 2024-02-03 NOTE — Progress Notes (Signed)
 Daily Session Note  Patient Details  Name: Derrius Furtick MRN: [id number] Date of Birth: [birth year] Referring Provider:   Flowsheet Row Cardiac Rehab from 08/21/2023 in [facility] Cardiac and Pulmonary Rehab  Referring Provider [facility]       Encounter Date: 02/03/2024  Check In:  Session Check In - 02/03/24 1120       Check-In   Supervising physician immediately available to respond to emergencies See telemetry face sheet for immediately available ER MD    Location [facility]-Cardiac & Pulmonary Rehab    Staff Present Cora Collum, RN, BSN, CCRP;Margaret Best, MS, Exercise Physiologist;Maxon Conetta BS, Exercise Physiologist;Kelly Cloretta Ned, ACSM CEP, Exercise Physiologist    Virtual Visit No    Medication changes reported     No    Fall or balance concerns reported    No    Warm-up and Cool-down Performed on first and last piece of equipment    Resistance Training Performed Yes    VAD Patient? No    PAD/SET Patient? No      Pain Assessment   Currently in Pain? No/denies                Social History   Tobacco Use  Smoking Status Former   Current packs/day: 0.00   Types: Cigarettes   Start date: 12/10/1956   Quit date: 12/10/1976   Years since quitting: 47.1  Smokeless Tobacco Never    Goals Met:  Independence with exercise equipment Exercise tolerated well No report of concerns or symptoms today  Goals Unmet:  Not Applicable  Comments: Pt able to follow exercise prescription today without complaint.  Will continue to monitor for progression.    Dr. Bethann Punches is Medical Director for [facility] Cardiac Rehabilitation.  Dr. Vida Rigger is Medical Director for [facility] Pulmonary Rehabilitation.

## 2024-02-05 ENCOUNTER — Encounter: Payer: Medicare Other | Admitting: *Deleted

## 2024-02-05 DIAGNOSIS — Z48812 Encounter for surgical aftercare following surgery on the circulatory system: Secondary | ICD-10-CM | POA: Diagnosis not present

## 2024-02-05 DIAGNOSIS — I252 Old myocardial infarction: Secondary | ICD-10-CM | POA: Diagnosis not present

## 2024-02-05 DIAGNOSIS — I214 Non-ST elevation (NSTEMI) myocardial infarction: Secondary | ICD-10-CM

## 2024-02-05 NOTE — Progress Notes (Signed)
 Daily Session Note  Patient Details  Name: Mike Decker MRN: [id number] Date of Birth: 1941/03/20 Referring Provider:   Flowsheet Row Cardiac Rehab from 08/21/2023 in [facility] Cardiac and Pulmonary Rehab  Referring Provider [facility]       Encounter Date: 02/05/2024  Check In:  Session Check In - 02/05/24 1133       Check-In   Supervising physician immediately available to respond to emergencies See telemetry face sheet for immediately available ER MD    Location [facility]-Cardiac & Pulmonary Rehab    Staff Present Susann Givens RN,BSN;Joseph [facility] Best, MS, Exercise Physiologist    Virtual Visit No    Medication changes reported     No    Fall or balance concerns reported    No    Warm-up and Cool-down Performed on first and last piece of equipment    Resistance Training Performed Yes    VAD Patient? No    PAD/SET Patient? No      Pain Assessment   Currently in Pain? No/denies                Social History   Tobacco Use  Smoking Status Former   Current packs/day: 0.00   Types: Cigarettes   Start date: 12/10/1956   Quit date: 12/10/1976   Years since quitting: 47.1  Smokeless Tobacco Never    Goals Met:  Independence with exercise equipment Exercise tolerated well No report of concerns or symptoms today Strength training completed today  Goals Unmet:  Not Applicable  Comments: Pt able to follow exercise prescription today without complaint.  Will continue to monitor for progression.    Dr. Bethann Punches is Medical Director for [facility] Cardiac Rehabilitation.  Dr. Vida Rigger is Medical Director for [facility] Pulmonary Rehabilitation.

## 2024-02-07 ENCOUNTER — Encounter: Payer: Medicare Other | Admitting: *Deleted

## 2024-02-07 DIAGNOSIS — I214 Non-ST elevation (NSTEMI) myocardial infarction: Secondary | ICD-10-CM

## 2024-02-07 DIAGNOSIS — I252 Old myocardial infarction: Secondary | ICD-10-CM | POA: Diagnosis not present

## 2024-02-07 DIAGNOSIS — Z48812 Encounter for surgical aftercare following surgery on the circulatory system: Secondary | ICD-10-CM | POA: Diagnosis not present

## 2024-02-07 NOTE — Progress Notes (Signed)
 Cardiac Individual Treatment Plan  Patient Details  Name: Lajuan Kovaleski MRN: [id number] Date of Birth: 07-Aug-1941 Referring Provider:   Flowsheet Row Cardiac Rehab from 08/21/2023 in [facility] Cardiac and Pulmonary Rehab  Referring Provider [facility]       Initial Encounter Date:  Flowsheet Row Cardiac Rehab from 08/21/2023 in [facility] Cardiac and Pulmonary Rehab  Date 08/21/23       Visit Diagnosis: NSTEMI (non-ST elevation myocardial infarction) [facility])  Patient's Home Medications on Admission:  Current Outpatient Medications:    acetaminophen (TYLENOL) 500 MG tablet, Take 1,000 mg by mouth every 8 (eight) hours as needed for mild pain., Disp: , Rfl:    atorvastatin (LIPITOR) 80 MG tablet, Take 1 tablet (80 mg total) by mouth daily., Disp: 30 tablet, Rfl: 0   azelastine (ASTELIN) 0.1 % nasal spray, USE 1 TO 2 SPRAYS IN EACH NOSTRIL TWICE A DAY, Disp: , Rfl:    calcitRIOL (ROCALTROL) 0.25 MCG capsule, Take 0.25 mcg by mouth daily., Disp: , Rfl:    cetirizine (ZYRTEC) 5 MG tablet, Take 5 mg by mouth daily., Disp: , Rfl:    citalopram (CELEXA) 10 MG tablet, Take 1 tablet (10 mg total) by mouth daily., Disp: 90 tablet, Rfl: 3   clopidogrel (PLAVIX) 75 MG tablet, TAKE 1 TABLET BY MOUTH EVERY DAY, Disp: 90 tablet, Rfl: 1   colchicine 0.6 MG tablet, Take 3 tablets (1.8 mg total) by mouth daily as needed (gout flares)., Disp: , Rfl:    [employer] VITAMIN B12 1000 MCG tablet, TAKE 1 TABLET BY MOUTH EVERY DAY, Disp: 30 tablet, Rfl: 0   EPINEPHrine (EPI-PEN) 0.3 mg/0.3 mL SOAJ injection, Inject into the muscle as needed (anaphylaxis)., Disp: , Rfl:    ergocalciferol (VITAMIN D2) 1.25 MG (50000 UT) capsule, Take 50,000 Units by mouth every 30 (thirty) days., Disp: , Rfl:    ferrous sulfate 325 (65 FE) MG EC tablet, TAKE 1 TABLET BY MOUTH EVERY DAY, Disp: 90 tablet, Rfl: 3   fluticasone (FLONASE) 50 MCG/ACT nasal spray, USE 1 SPRAY IN EACH NOSTRIL EVERY DAY, Disp: 32 g, Rfl: 6   folic acid (FOLVITE) 400  MCG tablet, Take 400 mcg by mouth daily., Disp: , Rfl:    insulin aspart (NOVOLOG) 100 UNIT/ML FlexPen, 10 UNITS IN AM, 30 UNITS AT LUNCH, 30 UNITS AT DINNER, Disp: , Rfl:    insulin glargine (LANTUS) 100 UNIT/ML injection, Inject 0.35 mLs (35 Units total) into the skin at bedtime. This is a decrease from your previous 45 units nightly. (Patient taking differently: Inject 54 Units into the skin at bedtime.), Disp: 10 mL, Rfl: 11   isosorbide mononitrate (IMDUR) 120 MG 24 hr tablet, Take 1 tablet (120 mg total) by mouth 2 (two) times daily., Disp: 60 tablet, Rfl: 0   JARDIANCE 10 MG TABS tablet, Take 10 mg by mouth daily., Disp: , Rfl:    magnesium oxide (MAG-OX) 400 (240 Mg) MG tablet, Take 1 tablet by mouth daily., Disp: , Rfl:    metoprolol succinate (TOPROL-XL) 50 MG 24 hr tablet, Take 1 tablet by mouth daily., Disp: , Rfl:    mometasone (ASMANEX) 220 MCG/ACT inhaler, Inhale 2 puffs into the lungs daily., Disp: , Rfl:    montelukast (SINGULAIR) 10 MG tablet, TAKE 1 TABLET AT BEDTIME, Disp: 90 tablet, Rfl: 3   nitroGLYCERIN (NITRODUR - DOSED IN MG/24 HR) 0.4 mg/hr patch, Place 0.4 mg onto the skin daily., Disp: , Rfl:    nitroGLYCERIN (NITROSTAT) 0.4 MG SL tablet, Place 0.4  mg under the tongue every 5 (five) minutes x 3 doses as needed for chest pain., Disp: , Rfl:    pantoprazole (PROTONIX) 40 MG tablet, Take 1 tablet (40 mg total) by mouth 2 (two) times daily before a meal., Disp: , Rfl:    ranolazine (RANEXA) 1000 MG SR tablet, Take 1 tablet (1,000 mg total) by mouth daily., Disp: 30 tablet, Rfl: 0   sodium bicarbonate 650 MG tablet, Take 650 mg by mouth 2 (two) times daily., Disp: , Rfl:    sodium fluoride (DENTA 5000 PLUS) 1.1 % CREA dental cream, See admin instructions., Disp: , Rfl:    Tiotropium Bromide-Olodaterol (STIOLTO RESPIMAT) 2.5-2.5 MCG/ACT AERS, Inhale 1 each into the lungs daily., Disp: , Rfl:    torsemide (DEMADEX) 20 MG tablet, Take 1 tablet (20 mg total) by mouth daily.  Increase to 1 tablet (20 mg total) by mouth TWICE daily (total daily dose 40 mg) as needed for up to 3 days for increased leg swelling, shortness of breath, weight gain 5+ lbs over 1-2 days. 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, esphageal   Chronic diastolic CHF (congestive heart failure) (HCC)    Colon polyp    Diabetes mellitus without complication (HCC)    Diverticulosis    Gout    Heart disease    Hemangioma    liver   Hyperlipidemia    Hypertension    Myocardial infarct (HCC)    Ocular hypertension    Peripheral vascular disease (HCC)    Skin cancer    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HCC)    Sleep apnea    Vitreoretinal degeneration     Tobacco Use: Social History   Tobacco Use  Smoking Status Former   Current packs/day: 0.00   Types: Cigarettes   Start date: 12/10/1956   Quit date: 12/10/1976   Years since quitting: 47.1  Smokeless Tobacco Never    Labs: Review Flowsheet  More data exists      Latest Ref Rng & Units 07/18/2021 08/15/2022 08/16/2022 04/19/2023 10/31/2023  Labs for ITP Cardiac and Pulmonary Rehab  Cholestrol 0 - 200 mg/dL - - 161  - -  LDL (calc) 0 - 99 mg/dL - - 33  - -  HDL-C >09 mg/dL - - 17  - -  Trlycerides <150 mg/dL - - 604  - -  Hemoglobin A1c 4.8 - 5.6 % 8.3     7.5  - 6.2  7.1     Details       This result is from an external source.          Exercise Target Goals: Exercise Program Goal: Individual exercise prescription set using results from initial 6 min walk test and THRR while considering  patient's activity barriers and safety.   Exercise Prescription Goal: Initial exercise prescription builds to 30-45 minutes a day of aerobic activity, 2-3 days per week.  Home exercise  guidelines will be given to patient during program as part of exercise prescription that the participant will acknowledge.   Education: Aerobic Exercise: - Group verbal and visual presentation on the components of exercise prescription. Introduces F.I.T.T principle from ACSM for exercise prescriptions.  Reviews  F.I.T.T. principles of aerobic exercise including progression. Written material given at graduation. Flowsheet Row Cardiac Rehab from 08/21/2023 in [facility] Cardiac and Pulmonary Rehab  Education need identified 08/21/23       Education: Resistance Exercise: - Group verbal and visual presentation on the components of exercise prescription. Introduces F.I.T.T principle from ACSM for exercise prescriptions  Reviews F.I.T.T. principles of resistance exercise including progression. Written material given at graduation.    Education: Exercise & Equipment Safety: - Individual verbal instruction and demonstration of equipment use and safety with use of the equipment. Flowsheet Row Cardiac Rehab from 08/21/2023 in [facility] Cardiac and Pulmonary Rehab  Date 08/21/23  Educator MB  Instruction Review Code 1- Verbalizes Understanding       Education: Exercise Physiology & General Exercise Guidelines: - Group verbal and written instruction with models to review the exercise physiology of the cardiovascular system and associated critical values. Provides general exercise guidelines with specific guidelines to those with heart or lung disease.    Education: Flexibility, Balance, Mind/Body Relaxation: - Group verbal and visual presentation with interactive activity on the components of exercise prescription. Introduces F.I.T.T principle from ACSM for exercise prescriptions. Reviews F.I.T.T. principles of flexibility and balance exercise training including progression. Also discusses the mind body connection.  Reviews various relaxation techniques to help reduce and manage stress (i.e. Deep breathing,  progressive muscle relaxation, and visualization). Balance handout provided to take home. Written material given at graduation.   Activity Barriers & Risk Stratification:  Activity Barriers & Cardiac Risk Stratification - 08/21/23 1552       Activity Barriers & Cardiac Risk Stratification   Activity Barriers Back Problems;Other (comment);Muscular Weakness    Comments Gout flares occasionally; back problems, but tolerates    Cardiac Risk Stratification High             6 Minute Walk:  6 Minute Walk     Row Name 08/21/23 1551 01/27/24 1135       6 Minute Walk   Phase Initial Discharge    Distance 890 feet 980 feet    Distance % Change -- 10 %    Distance Feet Change -- 90 ft    Walk Time 6 minutes 6 minutes    # of Rest Breaks 0 0    MPH 1.69 1.85    METS 1.43 1.71    RPE 13 16    Perceived Dyspnea  1 3    VO2 Peak 4.99 5.98    Symptoms Yes (comment) Yes (comment)    Comments Fatigue in legs Slight dizziness, chest tightness 4/10 resolved after rest    Resting HR 69 bpm 74 bpm    Resting BP 140/70 132/80    Resting Oxygen Saturation  97 % 96 %    Exercise Oxygen Saturation  during 6 min walk 99 % 98 %    Max Ex. HR 104 bpm 113 bpm    Max Ex. BP 142/58 146/60    2 Minute Post BP 140/66 --             Oxygen Initial Assessment:   Oxygen Re-Evaluation:   Oxygen Discharge (Final Oxygen Re-Evaluation):   Initial Exercise Prescription:  Initial Exercise Prescription - 08/21/23 1500       Date of Initial Exercise RX and Referring Provider   Date 08/21/23    Referring Provider [facility]      Oxygen   Maintain Oxygen Saturation 88% or higher      Treadmill   MPH  1.4    Grade 0    Minutes 15    METs 2.07      Recumbant Bike   Level 1    RPM 50    Watts 15    Minutes 15    METs 1.43      NuStep   Level 1   T4 and T6   SPM 80    Minutes 15    METs 1.43      T5 Nustep   Level 1    SPM 80    Minutes 15    METs 1.43      Prescription  Details   Frequency (times per week) 3    Duration Progress to 30 minutes of continuous aerobic without signs/symptoms of physical distress      Intensity   THRR 40-80% of Max Heartrate 96-123    Ratings of Perceived Exertion 11-13    Perceived Dyspnea 0-4      Progression   Progression Continue to progress workloads to maintain intensity without signs/symptoms of physical distress.      Resistance Training   Training Prescription Yes    Weight 4 lb    Reps 10-15             Perform Capillary Blood Glucose checks as needed.  Exercise Prescription Changes:   Exercise Prescription Changes     Row Name 08/21/23 1500 09/12/23 1600 09/25/23 1300 10/10/23 1500 10/22/23 1400     Response to Exercise   Blood Pressure (Admit) 140/70 110/68 142/84 130/58 132/52   Blood Pressure (Exercise) 142/58 180/62 164/62 142/72 158/62   Blood Pressure (Exit) 140/66 142/60 112/58 118/68 140/64   Heart Rate (Admit) 69 bpm 72 bpm 108 bpm 76 bpm 79 bpm   Heart Rate (Exercise) 104 bpm 105 bpm 108 bpm 100 bpm 111 bpm   Heart Rate (Exit) 72 bpm 84 bpm 83 bpm 74 bpm 81 bpm   Oxygen Saturation (Admit) 97 % -- -- -- --   Oxygen Saturation (Exercise) 99 % -- -- -- --   Oxygen Saturation (Exit) 97 % -- -- -- --   Rating of Perceived Exertion (Exercise) 13 13 15 15 15    Perceived Dyspnea (Exercise) 1 0 0 0 0   Symptoms fatigue in legs CP 3/4- resolved with rest none none none   Comments results -- -- -- --   Duration Progress to 30 minutes of  aerobic without signs/symptoms of physical distress Progress to 30 minutes of  aerobic without signs/symptoms of physical distress Progress to 30 minutes of  aerobic without signs/symptoms of physical distress Progress to 30 minutes of  aerobic without signs/symptoms of physical distress Progress to 30 minutes of  aerobic without signs/symptoms of physical distress   Intensity THRR New THRR unchanged THRR unchanged THRR unchanged THRR unchanged      Progression   Progression Continue to progress workloads to maintain intensity without signs/symptoms of physical distress. Continue to progress workloads to maintain intensity without signs/symptoms of physical distress. Continue to progress workloads to maintain intensity without signs/symptoms of physical distress. Continue to progress workloads to maintain intensity without signs/symptoms of physical distress. Continue to progress workloads to maintain intensity without signs/symptoms of physical distress.   Average METs 1.43 2.3 2.3 1.72 1.9     Resistance Training   Training Prescription -- Yes Yes Yes Yes   Weight -- 4 lb 4 lb 4 lb 4 lb   Reps -- 10-15 10-15 10-15 10-15  Interval Training   Interval Training -- No No No No     Treadmill   MPH -- 1.3 -- 1.4 --   Grade -- 0 -- 0 --   Minutes -- 15 -- 15 --   METs -- 1.99 -- 2.07 --     Recumbant Bike   Level -- 1 4 -- 3   Watts -- 25 19 -- 19   Minutes -- 15 15 -- 15   METs -- 2.91 2.68 -- 2.68     NuStep   Level -- 4 4 -- --   Minutes -- 15 15 -- --   METs -- -- 2.2 -- --     Arm Ergometer   Level -- -- -- 1.2 1.2   Minutes -- -- -- 15 15   METs -- -- -- 1 1.8     T5 Nustep   Level -- 3 4 4 4    Minutes -- 15 15 15 15    METs -- 2.1 2.1 2.1 2     Biostep-RELP   Level -- -- -- -- 3   Minutes -- -- -- -- 15   METs -- -- -- -- 2     Track   Laps -- -- -- -- 16   Minutes -- -- -- -- 15   METs -- -- -- -- 1.87     Oxygen   Maintain Oxygen Saturation -- 88% or higher 88% or higher 88% or higher 88% or higher    Row Name 10/23/23 1100 11/06/23 0900 12/06/23 0700 12/19/23 1500 12/31/23 1000     Response to Exercise   Blood Pressure (Admit) -- 142/82 122/64 122/62 100/62   Blood Pressure (Exit) -- 110/60 118/60 104/62 110/60   Heart Rate (Admit) -- 76 bpm 78 bpm 76 bpm 77 bpm   Heart Rate (Exercise) -- 105 bpm 107 bpm 107 bpm 107 bpm   Heart Rate (Exit) -- 73 bpm 78 bpm 88 bpm 82 bpm   Rating of Perceived  Exertion (Exercise) -- 15 12 13 15    Perceived Dyspnea (Exercise) -- -- -- -- 0   Symptoms -- none none none none   Duration -- Progress to 30 minutes of  aerobic without signs/symptoms of physical distress Progress to 30 minutes of  aerobic without signs/symptoms of physical distress Progress to 30 minutes of  aerobic without signs/symptoms of physical distress Progress to 30 minutes of  aerobic without signs/symptoms of physical distress   Intensity -- THRR unchanged THRR unchanged THRR unchanged THRR unchanged     Progression   Progression -- Continue to progress workloads to maintain intensity without signs/symptoms of physical distress. Continue to progress workloads to maintain intensity without signs/symptoms of physical distress. Continue to progress workloads to maintain intensity without signs/symptoms of physical distress. Continue to progress workloads to maintain intensity without signs/symptoms of physical distress.   Average METs -- 1.99 2.18 1.91 1.73     Resistance Training   Training Prescription -- Yes Yes Yes Yes   Weight -- 4 lb 4 lb 4 lb 4 lb   Reps -- 10-15 10-15 10-15 10-15     Interval Training   Interval Training -- No No No No     Oxygen   Oxygen -- -- -- Continuous --     Recumbant Bike   Level -- 3 3 -- --   Watts -- 20 20 -- --   Minutes -- 15 15 -- --   METs -- 2.71 2.73 -- --  Arm Ergometer   Level -- 1.2 -- -- 1.2   Watts -- -- -- -- 0   Minutes -- 15 -- -- 15   METs -- 1 -- -- 1.6     T5 Nustep   Level -- 3 2 3 3    Minutes -- 15 15 15 15    METs -- 2 1.9 2 2      Biostep-RELP   Level -- -- 3 -- 5   Minutes -- -- 15 -- 15   METs -- -- 2 -- 2     Track   Laps -- 16 20 15 15    Minutes -- 15 15 15 15    METs -- 1.87 2.09 1.82 1.82     Home Exercise Plan   Plans to continue exercise at Home (comment)  walking around his property and exercises from packet at home Home (comment)  walking around his property and exercises from packet at home  Home (comment)  walking around his property and exercises from packet at home Home (comment)  walking around his property and exercises from packet at home Home (comment)  walking around his property and exercises from packet at home   Frequency Add 1 additional day to program exercise sessions. Add 1 additional day to program exercise sessions. Add 1 additional day to program exercise sessions. Add 1 additional day to program exercise sessions. Add 1 additional day to program exercise sessions.   Initial Home Exercises Provided 10/23/23 10/23/23 10/23/23 10/23/23 10/23/23     Oxygen   Maintain Oxygen Saturation -- 88% or higher 88% or higher 88% or higher 88% or higher    Row Name 01/15/24 1600 01/31/24 0900           Response to Exercise   Blood Pressure (Admit) 100/62 116/58      Blood Pressure (Exit) 110/60 112/62      Heart Rate (Admit) 77 bpm 64 bpm      Heart Rate (Exercise) 107 bpm 98 bpm      Heart Rate (Exit) 82 bpm 76 bpm      Rating of Perceived Exertion (Exercise) 15 13      Perceived Dyspnea (Exercise) 0 --      Symptoms none none      Duration Progress to 30 minutes of  aerobic without signs/symptoms of physical distress Continue with 30 min of aerobic exercise without signs/symptoms of physical distress.      Intensity THRR unchanged THRR unchanged        Progression   Progression Continue to progress workloads to maintain intensity without signs/symptoms of physical distress. Continue to progress workloads to maintain intensity without signs/symptoms of physical distress.      Average METs 1.73 2.53        Resistance Training   Training Prescription Yes Yes      Weight 4 lb 4 lb      Reps 10-15 10-15        Interval Training   Interval Training No No        Recumbant Bike   Level -- 4      Watts -- 20      Minutes -- 15      METs -- 2.75        NuStep   Level -- 3      Minutes -- 15      METs -- 2.5        Arm Ergometer   Level 1.2 --  Watts 0 --       Minutes 15 --      METs 1.6 --        T5 Nustep   Level 3 3      Minutes 15 15      METs 2 1.9        Biostep-RELP   Level 5 --      Minutes 15 --      METs 2 --        Track   Laps 15 15      Minutes 15 15      METs 1.82 1.82        Home Exercise Plan   Plans to continue exercise at Home (comment)  walking around his property and exercises from packet at home Home (comment)  walking around his property and exercises from packet at home      Frequency Add 1 additional day to program exercise sessions. Add 1 additional day to program exercise sessions.      Initial Home Exercises Provided 10/23/23 10/23/23        Oxygen   Maintain Oxygen Saturation 88% or higher 88% or higher               Exercise Comments:   Exercise Comments     Row Name 08/26/23 1130 10/09/23 1241         Exercise Comments First full day of exercise!  Patient was oriented to gym and equipment including functions, settings, policies, and procedures.  Patient's individual exercise prescription and treatment plan were reviewed.  All starting workloads were established based on the results of the 6 minute walk test done at initial orientation visit.  The plan for exercise progression was also introduced and progression will be customized based on patient's performance and goals. Recent hospitalization for angina.  Meds increased to pre NSTEMI level.               Exercise Goals and Review:   Exercise Goals     Row Name 08/21/23 1556             Exercise Goals   Increase Physical Activity Yes       Intervention Provide advice, education, support and counseling about physical activity/exercise needs.;Develop an individualized exercise prescription for aerobic and resistive training based on initial evaluation findings, risk stratification, comorbidities and participant's personal goals.       Expected Outcomes Short Term: Attend rehab on a regular basis to increase amount of physical  activity.;Long Term: Add in home exercise to make exercise part of routine and to increase amount of physical activity.;Long Term: Exercising regularly at least 3-5 days a week.       Increase Strength and Stamina Yes       Intervention Provide advice, education, support and counseling about physical activity/exercise needs.;Develop an individualized exercise prescription for aerobic and resistive training based on initial evaluation findings, risk stratification, comorbidities and participant's personal goals.       Expected Outcomes Short Term: Increase workloads from initial exercise prescription for resistance, speed, and METs.;Short Term: Perform resistance training exercises routinely during rehab and add in resistance training at home;Long Term: Improve cardiorespiratory fitness, muscular endurance and strength as measured by increased METs and functional capacity ( )       Able to understand and use rate of perceived exertion (RPE) scale Yes       Intervention Provide education and explanation on how to use RPE scale  Expected Outcomes Short Term: Able to use RPE daily in rehab to express subjective intensity level;Long Term:  Able to use RPE to guide intensity level when exercising independently       Able to understand and use Dyspnea scale Yes       Intervention Provide education and explanation on how to use Dyspnea scale       Expected Outcomes Short Term: Able to use Dyspnea scale daily in rehab to express subjective sense of shortness of breath during exertion;Long Term: Able to use Dyspnea scale to guide intensity level when exercising independently       Knowledge and understanding of Target Heart Rate Range (THRR) Yes       Intervention Provide education and explanation of THRR including how the numbers were predicted and where they are located for reference       Expected Outcomes Short Term: Able to state/look up THRR;Short Term: Able to use daily as guideline for intensity in  rehab;Long Term: Able to use THRR to govern intensity when exercising independently       Able to check pulse independently Yes       Intervention Provide education and demonstration on how to check pulse in carotid and radial arteries.;Review the importance of being able to check your own pulse for safety during independent exercise       Expected Outcomes Short Term: Able to explain why pulse checking is important during independent exercise;Long Term: Able to check pulse independently and accurately       Understanding of Exercise Prescription Yes       Intervention Provide education, explanation, and written materials on patient's individual exercise prescription       Expected Outcomes Short Term: Able to explain program exercise prescription;Long Term: Able to explain home exercise prescription to exercise independently                Exercise Goals Re-Evaluation :  Exercise Goals Re-Evaluation     Row Name 08/26/23 1130 09/12/23 1648 09/25/23 1401 10/10/23 1539 10/10/23 1540     Exercise Goal Re-Evaluation   Exercise Goals Review Able to understand and use rate of perceived exertion (RPE) scale;Able to understand and use Dyspnea scale;Knowledge and understanding of Target Heart Rate Range (THRR);Understanding of Exercise Prescription Increase Physical Activity;Increase Strength and Stamina;Understanding of Exercise Prescription Increase Physical Activity;Increase Strength and Stamina;Understanding of Exercise Prescription Increase Physical Activity;Increase Strength and Stamina;Understanding of Exercise Prescription Increase Physical Activity;Increase Strength and Stamina;Understanding of Exercise Prescription   Comments Reviewed RPE  and dyspnea scale, THR and program prescription with pt today.  Pt voiced understanding and was given a copy of goals to take home. Lorin Picket is off to a great start in the program. He has attended his first 5 session during this review. He has already been able  to increase his level on the T4 nustep from 1 to 4. We will continue to monitor his progress in the program. Lorin Picket continues to do well in rehab. He has recently been able to signifigantly increase his level on the recumbent bike from level 1 to 4. He has also been able to maintain a consistent intensity on both the T4 and T5 nustep. We will continue to monitor his progress in the program. Lorin Picket continues to do well in rehab. Lorin Picket continues to do well in rehab. He was only able to attend 3 sessions during this review. In those 3 sessions he was able to maintain a consistent workload. We will continue to  monitor his progress in the program.   Expected Outcomes Short: Use RPE daily to regulate intensity. Long: Follow program prescription in THR. Short: Continue to follow current exercise prescription, and progressively increase workloads. Long: Continue exercise to improve strength and stamina. Short: Continue to follow current exercise prescription, and progressively increase workloads. Long: Continue exercise to improve strength and stamina. -- Short: Continue to follow current exercise prescription, and progressively increase workloads. Long: Continue exercise to improve strength and stamina.    Row Name 10/22/23 1500 10/23/23 1152 11/06/23 0948 11/19/23 1534 12/06/23 0729     Exercise Goal Re-Evaluation   Exercise Goals Review Increase Physical Activity;Increase Strength and Stamina;Understanding of Exercise Prescription Increase Physical Activity;Able to understand and use rate of perceived exertion (RPE) scale;Knowledge and understanding of Target Heart Rate Range (THRR);Understanding of Exercise Prescription;Increase Strength and Stamina;Able to understand and use Dyspnea scale;Able to check pulse independently Increase Physical Activity;Increase Strength and Stamina;Understanding of Exercise Prescription Increase Physical Activity;Increase Strength and Stamina;Understanding of Exercise Prescription  Increase Physical Activity;Increase Strength and Stamina;Understanding of Exercise Prescription   Comments Lorin Picket continues to do well in rehab. He has been able to maintain a constant workload on the recumbent bike and T5 nustep. He was also able to add the biostep at level 2, and the track at 16 laps, to his current exercise prescription. We will continue to monitor his progress in the program. Reviewed home exercise with pt today.  Pt plans to walk around property and do weight/band exercises from our packet for exercise. Pt plans to add 1 additional day of home exercise. Reviewed THR, pulse, RPE, sign and symptoms, pulse oximetery and when to call 911 or MD.  Also discussed weather considerations and indoor options.  Pt voiced understanding. Lorin Picket is doing well in rehab. He continues to walk 16 laps on the track and has stayed consistent with his workloads on the arm crank and recumbent bike. He did see a decrease from level 4 to level 3 on the T5 nustep. We will continue to monitor his progress in the program. Lorin Picket has not attended rehab since 11/18. He has been in contact with [place], and we hope he returns when appropriate. We will continue to monitor his progress in the program. Lorin Picket is doing well although he has only attended rehab twice since the last review. However, he was able to increase to 20 laps walked on the track. He also continues to work at level 3 on both the recumbent bike and the biostep. We will continue to monitor his progress in the program.   Expected Outcomes Short: Continue to follow current exercise prescription, and progressively increase workloads. Long: Continue exercise to improve strength and stamina. Short: Add 1 additional day of home exercise involving walking and weight/band exercises. Long: Continue to exercise at home independently. Short: Increase back up to level 4 on the T5 nustep. Long: Continue exercise to improve strength and stamina. Short: Return to the program. Long:  Continue exercise to improve strength and stamina. Short: Attend rehab more regularly. Long: Continue exercise to improve strength and stamina.    Row Name 12/18/23 1309 12/19/23 1544 12/31/23 1031 01/15/24 1620 01/20/24 1147     Exercise Goal Re-Evaluation   Exercise Goals Review Increase Physical Activity Increase Physical Activity;Increase Strength and Stamina;Understanding of Exercise Prescription Increase Physical Activity;Increase Strength and Stamina;Understanding of Exercise Prescription Increase Physical Activity;Increase Strength and Stamina;Understanding of Exercise Prescription Increase Physical Activity;Increase Strength and Stamina;Understanding of Exercise Prescription   Comments Lorin Picket is walking  his dog at least 3-4 times a day. He has not stated walking longer time and distance yet. He has been having frequent bouts of angina that had been happening when he exerted himself.  He is now wearing a nitroglycerin patch and his angina symptoms have decreased.  He will try walking longet time at home.  suggested he walk around in his house. Lorin Picket has returned to rehab and is doing well. He was able to return to level 3 on the T5 nustep. He was also able to complete 15 laps in 15 minutes. We will continue to monitor his progress in the program. Lorin Picket is doing well in rehab. he was able to increase his level on the biostep from level 3 to level 5. He was able to do 15 laps on the track and maintain level 3 on the T5 nustep. We will continue to monitor his progress in the program. Lorin Picket has not attended since the last review. He hopes to return this week, the week of 2/3. Once he returns he is due for his post . Lorin Picket reports that he is doing some chair exercises and stretches at home on off days of rehab. He states that this helps with his range of motion and strength.   Expected Outcomes STG Walk more than the times with his dog. LTG [name] is walking at least 10 -15 min at home several days a  week Short: attend rehab more regularly. Long: Continue exercise to improve strength and stamina. Short: Continue to follow current exercise prescription. Long: Continue exercise to improve strength and stamina. Short: Return to rehab and improve on post . Long: Continue exercise to improve strength and stamina and graduate from program. Short: continue to do exercises at home and work on a plan for what he is going to do for exercise once he graduates from rehab. Long: maintain independent exercise routine.    Row Name 01/31/24 1308 02/03/24 1111           Exercise Goal Re-Evaluation   Exercise Goals Review Increase Physical Activity;Increase Strength and Stamina;Understanding of Exercise Prescription Increase Physical Activity;Increase Strength and Stamina;Understanding of Exercise Prescription      Comments Lorin Picket is doing well in rehab and is close to graduating. He recently completed his post and improved by 10%. He also improved to level 4 on the recumbent bike and continues to work at level 3 on the T4 and T5 nustep machines. We will continue to monitor his progress until he graduates from the program. Lorin Picket states that he is still doing his chair exercises for 25 min, daily. He states that he is also doing some walking in place instead of walking outdoors. We will continue to monitor his progress in the program.      Expected Outcomes Short: Graduate. Long: Continue to exercise independently. Short: Continue to implement home exercises to provide a smooth transition post grad. Long: Continue exercise to improve strength and stamina.               Discharge Exercise Prescription (Final Exercise Prescription Changes):  Exercise Prescription Changes - 01/31/24 0900       Response to Exercise   Blood Pressure (Admit) 116/58    Blood Pressure (Exit) 112/62    Heart Rate (Admit) 64 bpm    Heart Rate (Exercise) 98 bpm    Heart Rate (Exit) 76 bpm    Rating of Perceived Exertion  (Exercise) 13    Symptoms none    Duration  Continue with 30 min of aerobic exercise without signs/symptoms of physical distress.    Intensity THRR unchanged      Progression   Progression Continue to progress workloads to maintain intensity without signs/symptoms of physical distress.    Average METs 2.53      Resistance Training   Training Prescription Yes    Weight 4 lb    Reps 10-15      Interval Training   Interval Training No      Recumbant Bike   Level 4    Watts 20    Minutes 15    METs 2.75      NuStep   Level 3    Minutes 15    METs 2.5      T5 Nustep   Level 3    Minutes 15    METs 1.9      Track   Laps 15    Minutes 15    METs 1.82      Home Exercise Plan   Plans to continue exercise at Home (comment)   walking around his property and exercises from packet at home   Frequency Add 1 additional day to program exercise sessions.    Initial Home Exercises Provided 10/23/23      Oxygen   Maintain Oxygen Saturation 88% or higher             Nutrition:  Target Goals: Understanding of nutrition guidelines, daily intake of sodium 1500mg , cholesterol 200mg , calories 30% from fat and 7% or less from saturated fats, daily to have 5 or more servings of fruits and vegetables.  Education: All About Nutrition: -Group instruction provided by verbal, written material, interactive activities, discussions, models, and posters to present general guidelines for heart healthy nutrition including fat, fiber, MyPlate, the role of sodium in heart healthy nutrition, utilization of the nutrition label, and utilization of this knowledge for meal planning. Follow up email sent as well. Written material given at graduation. Flowsheet Row Cardiac Rehab from 08/21/2023 in [facility]  Education need identified 08/21/23       Biometrics:  Pre Biometrics - 08/21/23 1557       Pre Biometrics   Height 5' 6.2" (1.681 m)    Weight 188 lb 9.6 oz (85.5 kg)     Waist Circumference 45.5 inches    Hip Circumference 46.5 inches    Waist to Hip Ratio 0.98 %    BMI (Calculated) 30.27    Single Leg Stand 4 seconds             Post Biometrics - 01/27/24 1137        Post  Biometrics   Height 5' 6.2" (1.681 m)    Weight 188 lb 14.4 oz (85.7 kg)    Waist Circumference 44.5 inches    Hip Circumference 41.5 inches    Waist to Hip Ratio 1.07 %    BMI (Calculated) 30.32    Single Leg Stand 2 seconds             Nutrition Therapy Plan and Nutrition Goals:  Nutrition Therapy & Goals - 08/21/23 1559       Personal Nutrition Goals   Nutrition Goal Will meet with RD in the next 2 weeks      Intervention Plan   Intervention Prescribe, educate and counsel regarding individualized specific dietary modifications aiming towards targeted core components such as weight, hypertension, lipid management, diabetes, heart failure and other comorbidities.;Nutrition handout(s) given  to patient.    Expected Outcomes Short Term Goal: Understand basic principles of dietary content, such as calories, fat, sodium, cholesterol and nutrients.;Short Term Goal: A plan has been developed with personal nutrition goals set during dietitian appointment.;Long Term Goal: Adherence to prescribed nutrition plan.             Nutrition Assessments:  MEDIFICTS Score Key: >=70 Need to make dietary changes  40-70 Heart Healthy Diet <= 40 Therapeutic Level Cholesterol Diet  Flowsheet Row Cardiac Rehab from 08/21/2023 in [facility] Cardiac and Pulmonary Rehab  Picture Your Plate Total Score on Admission 69      Picture Your Plate Scores: <40 Unhealthy dietary pattern with much room for improvement. 41-50 Dietary pattern unlikely to meet recommendations for good health and room for improvement. 51-60 More healthful dietary pattern, with some room for improvement.  >60 Healthy dietary pattern, although there may be some specific behaviors that could be improved.     Nutrition Goals Re-Evaluation:  Nutrition Goals Re-Evaluation     Row Name 12/18/23 1318 01/20/24 1158 02/03/24 1118         Goals   Nutrition Goal -- Patient deferred RD appointment at this time. Patient deferred RD appointment at this time.     Comment has not meet with RD this time in program. He does his best to eat healthy.  He was out of town this past week and they ate foods he does not normally eat. He has fluid retention and is working on taking his diurectic to get back to his ususal weight -- --     Expected Outcome STG get back to baseline weight watch sodium intake, try better food choices if possible when on vacation.   LTG  Maintain healthy eating habits -- --              Nutrition Goals Discharge (Final Nutrition Goals Re-Evaluation):  Nutrition Goals Re-Evaluation - 02/03/24 1118       Goals   Nutrition Goal Patient deferred RD appointment at this time.             Psychosocial: Target Goals: Acknowledge presence or absence of significant depression and/or stress, maximize coping skills, provide positive support system. Participant is able to verbalize types and ability to use techniques and skills needed for reducing stress and depression.   Education: Stress, Anxiety, and Depression - Group verbal and visual presentation to define topics covered.  Reviews how body is impacted by stress, anxiety, and depression.  Also discusses healthy ways to reduce stress and to treat/manage anxiety and depression.  Written material given at graduation.   Education: Sleep Hygiene -Provides group verbal and written instruction about how sleep can affect your health.  Define sleep hygiene, discuss sleep cycles and impact of sleep habits. Review good sleep hygiene tips.    Initial Review & Psychosocial Screening:   Quality of Life Scores:   Quality of Life - 08/21/23 1558       Quality of Life   Select Quality of Life      Quality of Life Scores    Health/Function Pre 12.53 %    Socioeconomic Pre 24.58 %    Psych/Spiritual Pre 20.14 %    Family Pre 25.2 %    GLOBAL Pre 18.26 %            Scores of 19 and below usually indicate a poorer quality of life in these areas.  A difference of  2-3 points is a clinically  meaningful difference.  A difference of 2-3 points in the total score of the Quality of Life Index has been associated with significant improvement in overall quality of life, self-image, physical symptoms, and general health in studies assessing change in quality of life.  PHQ-9: Review Flowsheet  More data exists      01/20/2024 08/21/2023 10/29/2022 12/18/2021 09/15/2021  Depression screen PHQ 2/9  Decreased Interest 0 0 0 0 0  Down, Depressed, Hopeless 0 0 0 0 0  PHQ - 2 Score 0 0 0 0 0  Altered sleeping 2 3 - - -  Tired, decreased energy 0 2 - - -  Change in appetite 0 0 - - -  Feeling bad or failure about yourself  0 0 - - -  Trouble concentrating 0 0 - - -  Moving slowly or fidgety/restless 0 0 - - -  Suicidal thoughts 0 0 - - -  PHQ-9 Score 2 5 - - -  Difficult doing work/chores Not difficult at all Not difficult at all - - -   Interpretation of Total Score  Total Score Depression Severity:  1-4 = Minimal depression, 5-9 = Mild depression, 10-14 = Moderate depression, 15-19 = Moderately severe depression, 20-27 = Severe depression   Psychosocial Evaluation and Intervention:   Psychosocial Re-Evaluation:  Psychosocial Re-Evaluation     Row Name 12/18/23 1313 01/20/24 1150 02/03/24 1114         Psychosocial Re-Evaluation   Current issues with None Identified None Identified Current Sleep Concerns     Comments Lorin Picket has been doing well without any concerns. His angina symptoms are better controlled after he started wearing the nitro patch prescribed in Dec. He recently was put of town with his family and did fine. [name]'s PHQ9 depression screening was re-done. His score went down from from a 5 to a 2,  which shows improvement. He states he has some stress in his life about many health conditions he is managing. 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 He continues to manage chest pain with his nitrol patch and sublingual nitro. No changes in this, reports that these are normal symptoms for him that he mangages with his doctor. Lorin Picket still has some trouble with his sleep. He is currently using his CPAP machine but still reports some difficulty. He states that he was precribed sleep medication about 2-3 years ago, he claimed that the medication helped him sleep but he doesnt want to become dependent on them for sleeping.     Expected Outcomes STG  COntinue to monitor health and follow MD prescibed activities and meds. Attend scheduled program sessions   LTG Maintainence of symptoms with meds and activities Short: have a follow up conversation with his doctor about abnmoral pet scan results. Long: maintain good mental health habits. Short: Continue to work with his doctor to improve his sleep. Long: Continue exercise to maintain a positive mental health.     Interventions Encouraged to attend Cardiac Rehabilitation for the exercise Encouraged to attend Cardiac Rehabilitation for the exercise Encouraged to attend Cardiac Rehabilitation for the exercise     Continue Psychosocial Services  Follow up required by staff Follow up required by staff Follow up required by staff              Psychosocial Discharge (Final Psychosocial Re-Evaluation):  Psychosocial Re-Evaluation - 02/03/24 1114  Psychosocial Re-Evaluation   Current issues with Current Sleep Concerns    Comments Lorin Picket still has some trouble with his sleep. He is currently using his CPAP machine but still reports some difficulty. He states that he was precribed sleep medication about 2-3 years ago, he claimed that the medication  helped him sleep but he doesnt want to become dependent on them for sleeping.    Expected Outcomes Short: Continue to work with his doctor to improve his sleep. Long: Continue exercise to maintain a positive mental health.    Interventions Encouraged to attend Cardiac Rehabilitation for the exercise    Continue Psychosocial Services  Follow up required by staff             Vocational Rehabilitation: Provide vocational rehab assistance to qualifying candidates.   Vocational Rehab Evaluation & Intervention:   Education: Education Goals: Education classes will be provided on a variety of topics geared toward better understanding of heart health and risk factor modification. Participant will state understanding/return demonstration of topics presented as noted by education test scores.  Learning Barriers/Preferences:   General Cardiac Education Topics:  AED/CPR: - Group verbal and written instruction with the use of models to demonstrate the basic use of the AED with the basic ABC's of resuscitation.   Anatomy and Cardiac Procedures: - Group verbal and visual presentation and models provide information about basic cardiac anatomy and function. Reviews the testing methods done to diagnose heart disease and the outcomes of the test results. Describes the treatment choices: Medical Management, Angioplasty, or Coronary Bypass Surgery for treating various heart conditions including Myocardial Infarction, Angina, Valve Disease, and Cardiac Arrhythmias.  Written material given at graduation.   Medication Safety: - Group verbal and visual instruction to review commonly prescribed medications for heart and lung disease. Reviews the medication, class of the drug, and side effects. Includes the steps to properly store meds and maintain the prescription regimen.  Written material given at graduation.   Intimacy: - Group verbal instruction through game format to discuss how heart and lung  disease can affect sexual intimacy. Written material given at graduation..   Know Your Numbers and Heart Failure: - Group verbal and visual instruction to discuss disease risk factors for cardiac and pulmonary disease and treatment options.  Reviews associated critical values for Overweight/Obesity, Hypertension, Cholesterol, and Diabetes.  Discusses basics of heart failure: signs/symptoms and treatments.  Introduces Heart Failure Zone chart for action plan for heart failure.  Written material given at graduation.   Infection Prevention: - Provides verbal and written material to individual with discussion of infection control including proper hand washing and proper equipment cleaning during exercise session. Flowsheet Row Cardiac Rehab from 08/21/2023 in [facility] Cardiac and Pulmonary Rehab  Date 08/21/23  Educator MB  Instruction Review Code 1- Verbalizes Understanding       Falls Prevention: - Provides verbal and written material to individual with discussion of falls prevention and safety. Flowsheet Row Cardiac Rehab from 08/21/2023 in [facility] Cardiac and Pulmonary Rehab  Date 08/21/23  Educator MB  Instruction Review Code 1- Verbalizes Understanding       Other: -Provides group and verbal instruction on various topics (see comments)   Knowledge Questionnaire Score:  Knowledge Questionnaire Score - 08/21/23 1603       Knowledge Questionnaire Score   Pre Score 23/26             Core Components/Risk Factors/Patient Goals at Admission:  Personal Goals and Risk Factors at Admission - 08/21/23  1603       Core Components/Risk Factors/Patient Goals on Admission    Weight Management Yes;Weight Loss    Intervention Weight Management: Develop a combined nutrition and exercise program designed to reach desired caloric intake, while maintaining appropriate intake of nutrient and fiber, sodium and fats, and appropriate energy expenditure required for the weight goal.;Weight  Management: Provide education and appropriate resources to help participant work on and attain dietary goals.;Weight Management/Obesity: Establish reasonable short term and long term weight goals.    Admit Weight 188 lb 9.6 oz (85.5 kg)    Goal Weight: Short Term 178 lb (80.7 kg)    Goal Weight: Long Term 173 lb (78.5 kg)    Expected Outcomes Short Term: Continue to assess and modify interventions until short term weight is achieved;Long Term: Adherence to nutrition and physical activity/exercise program aimed toward attainment of established weight goal;Weight Maintenance: Understanding of the daily nutrition guidelines, which includes 25-35% calories from fat, 7% or less cal from saturated fats, less than 200mg  cholesterol, less than 1.5gm of sodium, & 5 or more servings of fruits and vegetables daily;Understanding recommendations for meals to include 15-35% energy as protein, 25-35% energy from fat, 35-60% energy from carbohydrates, less than 200mg  of dietary cholesterol, 20-35 gm of total fiber daily;Weight Loss: Understanding of general recommendations for a balanced deficit meal plan, which promotes 1-2 lb weight loss per week and includes a negative energy balance of [phone] kcal/d;Understanding of distribution of calorie intake throughout the day with the consumption of 4-5 meals/snacks    Improve shortness of breath with ADL's Yes    Intervention Provide education, individualized exercise plan and daily activity instruction to help decrease symptoms of SOB with activities of daily living.    Expected Outcomes Short Term: Improve cardiorespiratory fitness to achieve a reduction of symptoms when performing ADLs;Long Term: Be able to perform more ADLs without symptoms or delay the onset of symptoms    Diabetes Yes    Intervention Provide education about signs/symptoms and action to take for hypo/hyperglycemia.;Provide education about proper nutrition, including hydration, and aerobic/resistive  exercise prescription along with prescribed medications to achieve blood glucose in normal ranges: Fasting glucose 65-99 mg/dL    Expected Outcomes Short Term: Participant verbalizes understanding of the signs/symptoms and immediate care of hyper/hypoglycemia, proper foot care and importance of medication, aerobic/resistive exercise and nutrition plan for blood glucose control.;Long Term: Attainment of HbA1C < 7%.    Heart Failure Yes    Intervention Provide a combined exercise and nutrition program that is supplemented with education, support and counseling about heart failure. Directed toward relieving symptoms such as shortness of breath, decreased exercise tolerance, and extremity edema.    Expected Outcomes Improve functional capacity of life;Short term: Attendance in program 2-3 days a week with increased exercise capacity. Reported lower sodium intake. Reported increased fruit and vegetable intake. Reports medication compliance.;Short term: Daily weights obtained and reported for increase. Utilizing diuretic protocols set by physician.;Long term: Adoption of self-care skills and reduction of barriers for early signs and symptoms recognition and intervention leading to self-care maintenance.    Hypertension Yes    Intervention Provide education on lifestyle modifcations including regular physical activity/exercise, weight management, moderate sodium restriction and increased consumption of fresh fruit, vegetables, and low fat dairy, alcohol moderation, and smoking cessation.;Monitor prescription use compliance.    Expected Outcomes Short Term: Continued assessment and intervention until BP is < 140/74mm HG in hypertensive participants. < 130/86mm HG in hypertensive participants with diabetes, heart failure  or chronic kidney disease.;Long Term: Maintenance of blood pressure at goal levels.    Lipids Yes    Intervention Provide education and support for participant on nutrition & aerobic/resistive  exercise along with prescribed medications to achieve LDL 70mg , HDL >40mg .    Expected Outcomes Short Term: Participant states understanding of desired cholesterol values and is compliant with medications prescribed. Participant is following exercise prescription and nutrition guidelines.;Long Term: Cholesterol controlled with medications as prescribed, with individualized exercise RX and with personalized nutrition plan. Value goals: LDL < 70mg , HDL > 40 mg.             Education:Diabetes - Individual verbal and written instruction to review signs/symptoms of diabetes, desired ranges of glucose level fasting, after meals and with exercise. Acknowledge that pre and post exercise glucose checks will be done for 3 sessions at entry of program. Flowsheet Row Cardiac Rehab from 08/21/2023 in [facility]  Date 08/21/23  Educator MB  Instruction Review Code 1- Verbalizes Understanding       Core Components/Risk Factors/Patient Goals Review:   Goals and Risk Factor Review     Row Name 12/18/23 1320 01/20/24 1154 02/03/24 1118         Core Components/Risk Factors/Patient Goals Review   Personal Goals Review Weight Management/Obesity;Improve shortness of breath with ADL's;Diabetes;Hypertension;Lipids Diabetes;Hypertension;Weight Management/Obesity Diabetes;Hypertension;Weight Management/Obesity     Review Lorin Picket has been out of town and has gained weight from fluid retention, he is taking his diurectics as prescribed., he has states that the exercise is helping his breathing, seeing improvements with movement of less shortness of breath. HIs last A1C was 7.1%, he is weighing himself daily to watch for increases in weight. He has a daily duirectic to take. His BP after exercise is 110-140/60, pre exercise 122-142/60-80. He is back to workon consistent exercise plan, continue to take his meds as prescribed and continue to monitor his daily blood sugar levels.   Last drawn  lipid  was in July , triglycerides were elevated,so no LDL result. [name] reports that his blood sugars have been a little higher then usual after a cortisone injection, but he is monitoring this consistently. He also takes all BP meds and monitors his BP at home. He also monitors weight and looks for fluid retention and states this has been fairly consistent. Lorin Picket states that he is still taking his blood sugar 2-3 times a day, he says that his levels have returned to normal after his cortisone injections concluded. He states that he is still taking his BP at least once a day, he states that his BP has been low after being on the nitro patch. He continues to check his weight daily to monitor his fluid retention.     Expected Outcomes STG Continue to attend sessions, work on adding exericse routine at home. Watch sodium intake and continue taking prescribed medications  LTG maintianing a healthy lifestyle after discharge Short: continue to monitor BP and weight at home. follow up with doctor if blood sugars continue to run higher then usual. Long: control cardiac risk factors. Short: continue to monitor BP, blood sugar, and weight at home. Long: control cardiac risk factors.              Core Components/Risk Factors/Patient Goals at Discharge (Final Review):   Goals and Risk Factor Review - 02/03/24 1118       Core Components/Risk Factors/Patient Goals Review   Personal Goals Review Diabetes;Hypertension;Weight Management/Obesity    Review Lorin Picket  states that he is still taking his blood sugar 2-3 times a day, he says that his levels have returned to normal after his cortisone injections concluded. He states that he is still taking his BP at least once a day, he states that his BP has been low after being on the nitro patch. He continues to check his weight daily to monitor his fluid retention.    Expected Outcomes Short: continue to monitor BP, blood sugar, and weight at home. Long: control cardiac risk  factors.             ITP Comments:  ITP Comments     Row Name 08/21/23 1550 08/26/23 1130 09/11/23 1009 10/09/23 1240 10/09/23 1241   ITP Comments Completed and gym orientation. Initial ITP created and sent for review to Dr. Daniel Nones. First full day of exercise!  Patient was oriented to gym and equipment including functions, settings, policies, and procedures.  Patient's individual exercise prescription and treatment plan were reviewed.  All starting workloads were established based on the results of the 6 minute walk test done at initial orientation visit.  The plan for exercise progression was also introduced and progression will be customized based on patient's performance and goals. 30 Day review completed. Medical Director ITP review done, changes made as directed, and signed approval by Medical Director.    new to program 30 Day review completed. Medical Director ITP review done, changes made as directed, and signed approval by Medical Director. Recent hospitalization for angina.  Meds increased to pre NSTEMI level.    Row Name 10/30/23 1137 11/19/23 1248 11/27/23 0957 12/25/23 1305 01/22/24 1120   ITP Comments 30 Day review completed. Medical Director ITP review done, changes made as directed, and signed approval by Medical Director. [name]'s wife called to inform staff that he was in the emergency department last night with chest pain. He is following up with Dr. Juliann Pares and plans to be out the rest of the week. They will call about next week if he needs to be absent. 30 Day review completed. Medical Director ITP review done, changes made as directed, and signed approval by Medical Director. 30 Day review completed. Medical Director ITP review done, changes made as directed, and signed approval by Medical Director. [name] arrived to day stating he almost fainted when he arrived, found a chair and sat down,  he came to program after he felt better.  BP 160/82.   He stated he just did not  feel well.  called his cardiologist and they were able to seehim in 10 minutes.  Lorin Picket was transported to cardiologist office via wheelchair.    Row Name 01/22/24 1123 02/07/24 1118         ITP Comments 30 Day review completed. Medical Director ITP review done, changes made as directed, and signed approval by Medical Director. Tania graduated today from  rehab with 36 sessions completed.  Details of the patient's exercise prescription and what He needs to do in order to continue the prescription and progress were discussed with patient.  Patient was given a copy of prescription and goals.  Patient verbalized understanding. Erling plans to continue to exercise by walking around his property and using home exercise packet given to during program.               Comments: Discharge ITP

## 2024-02-07 NOTE — Progress Notes (Signed)
 Discharge Summary   Mike Decker DOB: 17-Oct-2041  Mike Decker graduated today from  rehab with 36 sessions completed.  Details of the patient's exercise prescription and what He needs to do in order to continue the prescription and progress were discussed with patient.  Patient was given a copy of prescription and goals.  Patient verbalized understanding. [name] plans to continue to exercise by walking around his property and using home exercise packet given to during program.   6 Minute Walk     Row Name 08/21/23 1551 01/27/24 1135       6 Minute Walk   Phase Initial Discharge    Distance 890 feet 980 feet    Distance % Change -- 10 %    Distance Feet Change -- 90 ft    Walk Time 6 minutes 6 minutes    # of Rest Breaks 0 0    MPH 1.69 1.85    METS 1.43 1.71    RPE 13 16    Perceived Dyspnea  1 3    VO2 Peak 4.99 5.98    Symptoms Yes (comment) Yes (comment)    Comments Fatigue in legs Slight dizziness, chest tightness 4/10 resolved after rest    Resting HR 69 bpm 74 bpm    Resting BP 140/70 132/80    Resting Oxygen Saturation  97 % 96 %    Exercise Oxygen Saturation  during 6 min walk 99 % 98 %    Max Ex. HR 104 bpm 113 bpm    Max Ex. BP 142/58 146/60    2 Minute Post BP 140/66 --

## 2024-02-07 NOTE — Progress Notes (Signed)
 Daily Session Note  Patient Details  Name: Mike Decker MRN: [id number] Date of Birth: 1941/08/06 Referring Provider:   Flowsheet Row Cardiac Rehab from 08/21/2023 in [facility] Cardiac [facility]  Referring Provider [facility]       Encounter Date: 02/07/2024  Check In:  Session Check In - 02/07/24 1117       Check-In   Supervising physician immediately available to respond to emergencies See telemetry face sheet for immediately available ER MD    Location [facility]-Cardiac & Pulmonary Rehab    Staff Present Susann Givens RN,BSN;Joseph Weyman [place], [place], Exercise Physiologist    Virtual Visit No    Medication changes reported     No    Fall or balance concerns reported    No    Warm-up and Cool-down Performed on first and last piece of equipment    Resistance Training Performed Yes    VAD Patient? No    PAD/SET Patient? No      Pain Assessment   Currently in Pain? No/denies                Social History   Tobacco Use  Smoking Status Former   Current packs/day: 0.00   Types: Cigarettes   Start date: 12/10/1956   Quit date: 12/10/1976   Years since quitting: 47.1  Smokeless Tobacco Never    Goals Met:  Independence with exercise equipment Exercise tolerated well No report of concerns or symptoms today Strength training completed today  Goals Unmet:  Not Applicable  Comments:  Mike Decker graduated today from  rehab with 36 sessions completed.  Details of the patient's exercise prescription and what He needs to do in order to continue the prescription and progress were discussed with patient.  Patient was given a copy of prescription and goals.  Patient verbalized understanding. Mike Decker plans to continue to exercise by walking around his property and using home exercise packet given to during program.    Dr. Bethann Punches is Medical Director for [facility] Card[facility].  Dr. Vida Rigger is Medical Director for [facility]  Pulmonary Rehabilitation.

## 2024-02-11 ENCOUNTER — Inpatient Hospital Stay: Payer: Medicare Other | Attending: Oncology

## 2024-02-11 DIAGNOSIS — D631 Anemia in chronic kidney disease: Secondary | ICD-10-CM | POA: Diagnosis not present

## 2024-02-11 DIAGNOSIS — N184 Chronic kidney disease, stage 4 (severe): Secondary | ICD-10-CM | POA: Diagnosis not present

## 2024-02-11 DIAGNOSIS — E538 Deficiency of other specified B group vitamins: Secondary | ICD-10-CM | POA: Insufficient documentation

## 2024-02-11 LAB — IRON AND TIBC
Iron: 61 ug/dL (ref 45–182)
Saturation Ratios: 17 % — ABNORMAL LOW (ref 17.9–39.5)
TIBC: 353 ug/dL (ref 250–450)
UIBC: 292 ug/dL

## 2024-02-11 LAB — CBC WITH DIFFERENTIAL (CANCER CENTER ONLY)
Abs Immature Granulocytes: 0.13 10*3/uL — ABNORMAL HIGH (ref 0.00–0.07)
Basophils Absolute: 0.1 10*3/uL (ref 0.0–0.1)
Basophils Relative: 1 %
Eosinophils Absolute: 0.4 10*3/uL (ref 0.0–0.5)
Eosinophils Relative: 4 %
HCT: 40.4 % (ref 39.0–52.0)
Hemoglobin: 13.4 g/dL (ref 13.0–17.0)
Immature Granulocytes: 1 %
Lymphocytes Relative: 13 %
Lymphs Abs: 1.3 10*3/uL (ref 0.7–4.0)
MCH: 31.2 pg (ref 26.0–34.0)
MCHC: 33.2 g/dL (ref 30.0–36.0)
MCV: 94.2 fL (ref 80.0–100.0)
Monocytes Absolute: 1 10*3/uL (ref 0.1–1.0)
Monocytes Relative: 10 %
Neutro Abs: 6.5 10*3/uL (ref 1.7–7.7)
Neutrophils Relative %: 71 %
Platelet Count: 257 10*3/uL (ref 150–400)
RBC: 4.29 MIL/uL (ref 4.22–5.81)
RDW: 13.3 % (ref 11.5–15.5)
WBC Count: 9.3 10*3/uL (ref 4.0–10.5)
nRBC: 0 % (ref 0.0–0.2)

## 2024-02-11 LAB — FERRITIN: Ferritin: 76 ng/mL (ref 24–336)

## 2024-02-11 LAB — RETIC PANEL
Immature Retic Fract: 7.9 % (ref 2.3–15.9)
RBC.: 4.29 MIL/uL (ref 4.22–5.81)
Retic Count, Absolute: 100.8 10*3/uL (ref 19.0–186.0)
Retic Ct Pct: 2.4 % (ref 0.4–3.1)
Reticulocyte Hemoglobin: 32.3 pg (ref >27.9)

## 2024-02-11 LAB — VITAMIN B12: Vitamin B-12: 428 pg/mL (ref 180–914)

## 2024-02-13 ENCOUNTER — Encounter: Payer: Self-pay | Admitting: Oncology

## 2024-02-13 ENCOUNTER — Inpatient Hospital Stay: Payer: Medicare Other

## 2024-02-13 ENCOUNTER — Inpatient Hospital Stay: Payer: Medicare Other | Admitting: Oncology

## 2024-02-13 VITALS — BP 117/63 | HR 78 | Temp 97.9°F | Resp 20 | Ht 66.2 in | Wt 186.0 lb

## 2024-02-13 VITALS — BP 138/66 | HR 77 | Resp 18

## 2024-02-13 DIAGNOSIS — D631 Anemia in chronic kidney disease: Secondary | ICD-10-CM

## 2024-02-13 DIAGNOSIS — K22711 Barrett's esophagus with high grade dysplasia: Secondary | ICD-10-CM

## 2024-02-13 DIAGNOSIS — E538 Deficiency of other specified B group vitamins: Secondary | ICD-10-CM

## 2024-02-13 DIAGNOSIS — E875 Hyperkalemia: Secondary | ICD-10-CM | POA: Diagnosis not present

## 2024-02-13 DIAGNOSIS — N184 Chronic kidney disease, stage 4 (severe): Secondary | ICD-10-CM

## 2024-02-13 DIAGNOSIS — I129 Hypertensive chronic kidney disease with stage 1 through stage 4 chronic kidney disease, or unspecified chronic kidney disease: Secondary | ICD-10-CM | POA: Diagnosis not present

## 2024-02-13 DIAGNOSIS — E8722 Chronic metabolic acidosis: Secondary | ICD-10-CM | POA: Diagnosis not present

## 2024-02-13 DIAGNOSIS — N2581 Secondary hyperparathyroidism of renal origin: Secondary | ICD-10-CM | POA: Diagnosis not present

## 2024-02-13 DIAGNOSIS — E871 Hypo-osmolality and hyponatremia: Secondary | ICD-10-CM | POA: Diagnosis not present

## 2024-02-13 DIAGNOSIS — E1122 Type 2 diabetes mellitus with diabetic chronic kidney disease: Secondary | ICD-10-CM | POA: Diagnosis not present

## 2024-02-13 LAB — PROTEIN / CREATININE RATIO, URINE
Creatinine, Urine: 250
Creatinine, Urine: 55

## 2024-02-13 MED ORDER — IRON SUCROSE 20 MG/ML IV SOLN
200.0000 mg | Freq: Once | INTRAVENOUS | Status: AC
  Administered 2024-02-13: 200 mg via INTRAVENOUS
  Filled 2024-02-13: qty 10

## 2024-02-13 MED ORDER — SODIUM CHLORIDE 0.9% FLUSH
10.0000 mL | Freq: Once | INTRAVENOUS | Status: AC | PRN
Start: 2024-02-13 — End: 2024-02-13
  Administered 2024-02-13: 10 mL
  Filled 2024-02-13: qty 10

## 2024-02-13 NOTE — Assessment & Plan Note (Addendum)
 History of Vitamin B12 deficiency, negative intrinsic factor antibody and antiparital antibody.  Recommend  oral vitamin B12 supplementation daily

## 2024-02-13 NOTE — Assessment & Plan Note (Addendum)
#  Anemia in CKD Labs are reviewed and are discussed with patient Lab Results  Component Value Date   HGB 13.4 02/11/2024   TIBC 353 02/11/2024   IRONPCTSAT 17 (L) 02/11/2024   FERRITIN 76 02/11/2024   Stable hemoglobin.  Recommend 1 dose of Venofer to improve iron store.  After IV venofer, recommend maintenance ferrous sulfate 325mg  daily. Marland Kitchen

## 2024-02-13 NOTE — Assessment & Plan Note (Signed)
With history of high-grade dysplastic.  S/p resection.  Continue follow-up with gastroenterology.

## 2024-02-13 NOTE — Progress Notes (Signed)
 Saw Dr. Hyacinth Meeker at [facility] for plantar fasciitis.   Newly dx with CHF. He is having some dizziness. Sees Dr. Juliann Pares at [facility]. Labs were done. Concerned with RBC. Was told needs aortic valve replacement.

## 2024-02-13 NOTE — Progress Notes (Signed)
 Hematology/Oncology Follow Up Note [facility]  [phone] [phone] REASON FOR VISIT follow-up for management of iron deficiency anemia, anemia and CKD,   ASSESSMENT & PLAN:   Anemia of chronic renal failure, stage 4 (severe) (HCC) #Anemia in CKD Labs are reviewed and are discussed with patient Lab Results  Component Value Date   HGB 13.4 02/11/2024   TIBC 353 02/11/2024   IRONPCTSAT 17 (L) 02/11/2024   FERRITIN 76 02/11/2024   Stable hemoglobin.  Recommend 1 dose of Venofer to improve iron store.  After IV venofer, recommend maintenance ferrous sulfate 325mg  daily. .  Vitamin B12 deficiency History of Vitamin B12 deficiency, negative intrinsic factor antibody and antiparital antibody.  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  S/p resection.  Continue follow-up with gastroenterology.   Orders Placed This Encounter  Procedures   CBC with Differential (Cancer Center Only)    Standing Status:   Future    Expected Date:   08/15/2024    Expiration Date:   02/12/2025   Iron and TIBC    Standing Status:   Future    Expected Date:   08/15/2024    Expiration Date:   02/12/2025   Ferritin    Standing Status:   Future    Expected Date:   08/15/2024    Expiration Date:   02/12/2025   Vitamin B12    Standing Status:   Future    Expected Date:   08/15/2024    Expiration Date:   02/12/2025   Retic Panel    Standing Status:   Future    Expected Date:   08/15/2024    Expiration Date:   02/12/2025  Follow-up in 6 months.  All questions were answered. The patient knows to call the clinic with any problems, questions or concerns.  Rickard Patience, MD, PhD [facility] Hematology Oncology 02/13/2024     PERTINENT HEMATOLOGY Oncology HISTORY received IV Venofer daily x3 as well as multiple units of PRBC transfusion. GI was consulted and patient had EGD done during his hospitalization.   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  Treated with argon plasma coagulation.  Colonoscopy showed polyps which were removed. Patient was discharged on 12/15/2018.  # 03/23/2019 mammogram showed gynecomastia.  #History of cutaneous melanoma status post Mohs surgery. 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 95% stenosis in the left circumflex status post drug-eluting stent. Patient is on aspirin and Plavix prior to this cardiac event. 04/18/2020, patient has a ER visit due to chest pain. Troponin was negative. Pain improved with medication. Patient was discharged with outpatient cardiology follow-up.  hospitalization 12/09/2022 in [facility] house due to chest pain/non-STEMI, started on heparin drip.  Seen by cardiology Dr. Juliann Pares.    INTERVAL HISTORY 82 y.o. male with PMH listed as below present to follow-up for anemia of CKD.  He follows up with cardiology for CHF.   chronic shortness of breath at baseline. He feels tired.   Review of Systems  Constitutional:  Positive for fatigue. Negative for appetite change, chills, fever and unexpected weight change.  HENT:   Negative for hearing loss and voice change.   Eyes:  Negative for eye problems and icterus.  Respiratory:  Positive for shortness of breath. Negative for chest tightness and cough.   Cardiovascular:  Negative for chest pain and  leg swelling.  Gastrointestinal:  Negative for abdominal distention and abdominal pain.  Endocrine: Negative for hot flashes.  Genitourinary:  Negative for difficulty urinating, dysuria and frequency.   Musculoskeletal:  Negative for arthralgias.  Skin:  Negative for itching and rash.  Neurological:  Negative for light-headedness and numbness.  Hematological:  Negative for adenopathy. Bruises/bleeds easily.  Psychiatric/Behavioral:  Negative for confusion.     Allergies   Allergen Reactions   Gabapentin     Other reaction(s): Other (See Comments) Tremors   Peanut-Containing Drug Products Anaphylaxis   Penicillins Hives and Rash    Has patient had a PCN reaction causing immediate rash, facial/tongue/throat swelling, SOB or lightheadedness with hypotension: Yes Has patient had a PCN reaction causing severe rash involving mucus membranes or skin necrosis: No Has patient had a PCN reaction that required hospitalization No Has patient had a PCN reaction occurring within the last 10 years: No If all of the above answers are "NO", then may proceed with Cephalosporin use.   Bee Venom Swelling   Influenza Vaccines Hives   Inh [Isoniazid] Hives   Jardiance [Empagliflozin] Other (See Comments)    Diarrhea   Kenalog [Triamcinolone Acetonide] Hives   Levaquin [Levofloxacin] Other (See Comments)    Tendon, ligament pain.    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, esphageal   Chronic diastolic CHF (congestive heart failure) (HCC)    Colon polyp    Diabetes mellitus without complication (HCC)    Diverticulosis    Gout    Heart disease    Hemangioma    liver   Hyperlipidemia    Hypertension    Myocardial infarct [facility])    Ocular hypertension    Peripheral vascular disease (HCC)    Skin cancer    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HCC)    Sleep apnea    Vitreoretinal degeneration      Past Surgical History:  Procedure Laterality Date   CATARACT EXTRACTION  2011, 2012   COLONOSCOPY N/A 12/14/2018   Procedure: COLONOSCOPY;  Surgeon: Pasty Spillers, MD;  Location: [facility] ENDOSCOPY;  Service: Endoscopy;  Laterality: N/A;   CORONARY ANGIOPLASTY WITH STENT  PLACEMENT  12/10/2010   CORONARY STENT INTERVENTION N/A 12/29/2019   Procedure: CORONARY STENT INTERVENTION;  Surgeon: Yvonne Kendall, MD;  Location: [facility] INVASIVE CV LAB;  Service: Cardiovascular;  Laterality: N/A;   ESOPHAGOGASTRODUODENOSCOPY N/A 12/14/2018   Procedure: ESOPHAGOGASTRODUODENOSCOPY (EGD);  Surgeon: Pasty Spillers, MD;  Location: [facility] ENDOSCOPY;  Service: Endoscopy;  Laterality: N/A;   ESOPHAGOGASTRODUODENOSCOPY (EGD) WITH PROPOFOL N/A 06/01/2016   Procedure: ESOPHAGOGASTRODUODENOSCOPY (EGD) WITH PROPOFOL;  Surgeon: Scot Jun, MD;  Location: [facility] ENDOSCOPY;  Service: Endoscopy;  Laterality: N/A;   ESOPHAGOGASTRODUODENOSCOPY (EGD) WITH PROPOFOL N/A 01/31/2023   Procedure: ESOPHAGOGASTRODUODENOSCOPY (EGD) WITH PROPOFOL;  Surgeon: Midge Minium, MD;  [facility] ENDOSCOPY;  Service: Endoscopy;  Laterality: N/A;   FEMORAL ARTERY STENT Right    HERNIA REPAIR     x2   INTRAOCULAR LENS INSERTION     LEFT HEART CATH AND CORONARY ANGIOGRAPHY N/A 05/09/2017   Procedure: Left Heart Cath  and Coronary Angiography;  Surgeon: Alwyn Pea, MD;  Location: [facility] INVASIVE CV LAB;  Service: Cardiovascular;  Laterality: N/A;   LEFT HEART CATH AND CORONARY ANGIOGRAPHY N/A 12/08/2018   Procedure: LEFT HEART CATH AND CORONARY ANGIOGRAPHY and possible PCI and stent;  Surgeon: Alwyn Pea, MD;  Location: [facility] INVASIVE CV LAB;  Service: Cardiovascular;  Laterality: N/A;   LEFT HEART CATH AND CORONARY ANGIOGRAPHY N/A 12/29/2019   Procedure: LEFT HEART CATH AND CORONARY ANGIOGRAPHY;  Surgeon: Yvonne Kendall, MD;  Location: [facility] INVASIVE CV LAB;  Service: Cardiovascular;  Laterality: N/A;   LEFT HEART CATH AND CORONARY ANGIOGRAPHY Left 12/13/2022   Procedure: LEFT HEART CATH AND CORONARY ANGIOGRAPHY;  Surgeon: Alwyn Pea, MD;  Location: [facility] INVASIVE CV LAB;  Service: Cardiovascular;  Laterality: Left;   NOSE SURGERY     submucous resection   PROSTATE SURGERY  12/10/2000    ROTATOR CUFF REPAIR Right     Social History   Socioeconomic History   Marital status: Married    Spouse name: Not on file   Number of children: 2   Years of education: College 3 years   Highest education level: Associate degree: occupational, Scientist, product/process development, or vocational program  Occupational History   Occupation: retired  Tobacco Use   Smoking status: Former    Current packs/day: 0.00    Types: Cigarettes    Start date: 12/10/1956    Quit date: 12/10/1976    Years since quitting: 47.2   Smokeless tobacco: Never  Vaping Use   Vaping status: Never Used  Substance and Sexual Activity   Alcohol use: No   Drug use: No   Sexual activity: Not Currently  Other Topics Concern   Not on file  Social History Narrative   Not on file   Social Drivers of Health   Financial Resource Strain: Low Risk  (06/21/2023)   Received from [facility]   Overall Financial Resource Strain (CARDIA)    Difficulty of Paying Living Expenses: Not very hard  Food Insecurity: No Food Insecurity (10/30/2023)   Hunger Vital Sign    Worried About Running Out of Food in the Last Year: Never true    Ran Out of Food in the Last Year: Never true  Transportation Needs: No Transportation Needs (10/30/2023)   PRAPARE - Administrator, Civil Service (Medical): No    Lack of Transportation (Non-Medical): No  Physical Activity: Insufficiently Active (09/15/2021)   Exercise Vital Sign    Days of Exercise per Week: 7 days    Minutes of Exercise per Session: 20 min  Stress: No Stress Concern Present (09/15/2021)   [employer] of Occupational Health - Occupational Stress Questionnaire    Feeling of Stress : Not at all  Social Connections: Moderately Isolated (09/15/2021)   Social Connection and Isolation Panel [NHANES]    Frequency of Communication with Friends and Family: Once a week    Frequency of Social Gatherings with Friends and Family: Once a week    Attends Religious Services:  More than 4 times per year    Active Member of [employer] or Organizations: No    Attends Banker Meetings: Never    Marital Status: Married  Catering manager Violence: Not At Risk (10/30/2023)   Humiliation, Afraid, Rape, and Kick questionnaire    Fear of Current or Ex-Partner: No    Emotionally Abused: No    Physically Abused: No    Sexually Abused: No    Family  History  Problem Relation Age of Onset   Arthritis Mother    Stroke Maternal Grandfather    Breast cancer Neg Hx      Current Outpatient Medications:    acetaminophen (TYLENOL) 500 MG tablet, Take 1,000 mg by mouth every 8 (eight) hours as needed for mild pain., Disp: , Rfl:    atorvastatin (LIPITOR) 80 MG tablet, Take 1 tablet (80 mg total) by mouth daily., Disp: 30 tablet, Rfl: 0   azelastine (ASTELIN) 0.1 % nasal spray, USE 1 TO 2 SPRAYS IN EACH NOSTRIL TWICE A DAY, Disp: , Rfl:    calcitRIOL (ROCALTROL) 0.25 MCG capsule, Take 0.25 mcg by mouth daily., Disp: , Rfl:    cetirizine (ZYRTEC) 5 MG tablet, Take 5 mg by mouth daily., Disp: , Rfl:    citalopram (CELEXA) 10 MG tablet, Take 1 tablet (10 mg total) by mouth daily., Disp: 90 tablet, Rfl: 3   clopidogrel (PLAVIX) 75 MG tablet, TAKE 1 TABLET BY MOUTH EVERY DAY, Disp: 90 tablet, Rfl: 1   [employer] VITAMIN B12 1000 MCG tablet, TAKE 1 TABLET BY MOUTH EVERY DAY, Disp: 30 tablet, Rfl: 0   EPINEPHrine (EPI-PEN) 0.3 mg/0.3 mL SOAJ injection, Inject into the muscle as needed (anaphylaxis)., Disp: , Rfl:    ergocalciferol (VITAMIN D2) 1.25 MG (50000 UT) capsule, Take 50,000 Units by mouth every 30 (thirty) days., Disp: , Rfl:    ferrous sulfate 325 (65 FE) MG EC tablet, TAKE 1 TABLET BY MOUTH EVERY DAY, Disp: 90 tablet, Rfl: 3   fluticasone (FLONASE) 50 MCG/ACT nasal spray, USE 1 SPRAY IN EACH NOSTRIL EVERY DAY, Disp: 32 g, Rfl: 6   folic acid (FOLVITE) 400 MCG tablet, Take 400 mcg by mouth daily., Disp: , Rfl:    insulin aspart (NOVOLOG) 100 UNIT/ML FlexPen, 14 UNITS IN AM,  30 UNITS AT LUNCH, 30 UNITS AT DINNER, Disp: , Rfl:    insulin glargine (LANTUS) 100 UNIT/ML injection, Inject 0.35 mLs (35 Units total) into the skin at bedtime. This is a decrease from your previous 45 units nightly. (Patient taking differently: Inject 60 Units into the skin at bedtime.), Disp: 10 mL, Rfl: 11   isosorbide mononitrate (IMDUR) 120 MG 24 hr tablet, Take 1 tablet (120 mg total) by mouth 2 (two) times daily., Disp: 60 tablet, Rfl: 0   JARDIANCE 10 MG TABS tablet, Take 10 mg by mouth daily., Disp: , Rfl:    magnesium oxide (MAG-OX) 400 (240 Mg) MG tablet, Take 1 tablet by mouth daily., Disp: , Rfl:    metoprolol succinate (TOPROL-XL) 50 MG 24 hr tablet, Take 1 tablet by mouth daily., Disp: , Rfl:    mometasone (ASMANEX) 220 MCG/ACT inhaler, Inhale 2 puffs into the lungs daily., Disp: , Rfl:    montelukast (SINGULAIR) 10 MG tablet, TAKE 1 TABLET AT BEDTIME, Disp: 90 tablet, Rfl: 3   nitroGLYCERIN (NITRODUR - DOSED IN MG/24 HR) 0.4 mg/hr patch, Place 0.4 mg onto the skin daily., Disp: , Rfl:    nitroGLYCERIN (NITROSTAT) 0.4 MG SL tablet, Place 0.4 mg under the tongue every 5 (five) minutes x 3 doses as needed for chest pain., Disp: , Rfl:    pantoprazole (PROTONIX) 40 MG tablet, Take 1 tablet (40 mg total) by mouth 2 (two) times daily before a meal., Disp: , Rfl:    ranolazine (RANEXA) 1000 MG SR tablet, Take 1 tablet (1,000 mg total) by mouth daily. (Patient taking differently: Take 500 mg by mouth 2 (two) times daily.), Disp: 30 tablet,  Rfl: 0   sodium fluoride (DENTA 5000 PLUS) 1.1 % CREA dental cream, See admin instructions., Disp: , Rfl:    Tiotropium Bromide-Olodaterol (STIOLTO RESPIMAT) 2.5-2.5 MCG/ACT AERS, Inhale 1 each into the lungs daily., Disp: , Rfl:    torsemide (DEMADEX) 20 MG tablet, Take 40 mg by mouth 2 (two) times daily. Takes 40 mg bid on Monday, Wed and Friday and takes 40 mg once a day all other days, Disp: , Rfl:   Physical exam:  Vitals:   02/13/24 1446  BP:  117/63  Pulse: 78  Resp: 20  Temp: 97.9 F (36.6 C)  TempSrc: Oral  SpO2: 97%  Weight: 186 lb (84.4 kg)  Height: 5' 6.2" (1.681 m)   Physical Exam Constitutional:      General: He is not in acute distress.    Appearance: He is obese.  HENT:     Head: Normocephalic and atraumatic.  Eyes:     General: No scleral icterus. Cardiovascular:     Rate and Rhythm: Normal rate.     Heart sounds: Normal heart sounds.  Pulmonary:     Effort: Pulmonary effort is normal. No respiratory distress.  Abdominal:     General: Bowel sounds are normal.     Palpations: Abdomen is soft.  Musculoskeletal:        General: Normal range of motion.     Cervical back: Normal range of motion and neck supple.  Skin:    Findings: No erythema or rash.  Neurological:     Mental Status: He is alert and oriented to person, place, and time. Mental status is at baseline.  Psychiatric:        Mood and Affect: Mood normal.        Latest Ref Rng & Units 11/19/2023    3:03 AM  CMP  Glucose 70 - 99 mg/dL 161   BUN 8 - 23 mg/dL 83   Creatinine 0.96 - 1.24 mg/dL 0.45   Sodium 409 - 811 mmol/L 134   Potassium 3.5 - 5.1 mmol/L 4.0   Chloride 98 - 111 mmol/L 100   CO2 22 - 32 mmol/L 20   Calcium 8.9 - 10.3 mg/dL 8.7       Latest Ref Rng & Units 02/11/2024    9:29 AM  CBC  WBC 4.0 - 10.5 K/uL 9.3   Hemoglobin 13.0 - 17.0 g/dL 91.4   Hematocrit 78.2 - 52.0 % 40.4   Platelets 150 - 400 K/uL 257     No results found.

## 2024-02-20 DIAGNOSIS — Z794 Long term (current) use of insulin: Secondary | ICD-10-CM | POA: Diagnosis not present

## 2024-02-20 DIAGNOSIS — E875 Hyperkalemia: Secondary | ICD-10-CM | POA: Diagnosis not present

## 2024-02-20 DIAGNOSIS — D631 Anemia in chronic kidney disease: Secondary | ICD-10-CM | POA: Diagnosis not present

## 2024-02-20 DIAGNOSIS — R809 Proteinuria, unspecified: Secondary | ICD-10-CM | POA: Diagnosis not present

## 2024-02-20 DIAGNOSIS — I2510 Atherosclerotic heart disease of native coronary artery without angina pectoris: Secondary | ICD-10-CM | POA: Diagnosis not present

## 2024-02-20 DIAGNOSIS — N184 Chronic kidney disease, stage 4 (severe): Secondary | ICD-10-CM | POA: Diagnosis not present

## 2024-02-20 DIAGNOSIS — E1159 Type 2 diabetes mellitus with other circulatory complications: Secondary | ICD-10-CM | POA: Diagnosis not present

## 2024-02-20 DIAGNOSIS — I129 Hypertensive chronic kidney disease with stage 1 through stage 4 chronic kidney disease, or unspecified chronic kidney disease: Secondary | ICD-10-CM | POA: Diagnosis not present

## 2024-02-20 DIAGNOSIS — N2581 Secondary hyperparathyroidism of renal origin: Secondary | ICD-10-CM | POA: Diagnosis not present

## 2024-02-20 DIAGNOSIS — E1142 Type 2 diabetes mellitus with diabetic polyneuropathy: Secondary | ICD-10-CM | POA: Diagnosis not present

## 2024-02-20 DIAGNOSIS — E1122 Type 2 diabetes mellitus with diabetic chronic kidney disease: Secondary | ICD-10-CM | POA: Diagnosis not present

## 2024-02-20 DIAGNOSIS — E1129 Type 2 diabetes mellitus with other diabetic kidney complication: Secondary | ICD-10-CM | POA: Diagnosis not present

## 2024-02-27 ENCOUNTER — Encounter: Payer: Self-pay | Admitting: Physician Assistant

## 2024-02-27 ENCOUNTER — Ambulatory Visit (INDEPENDENT_AMBULATORY_CARE_PROVIDER_SITE_OTHER): Payer: Medicare Other | Admitting: Physician Assistant

## 2024-02-27 VITALS — BP 121/55 | HR 96 | Temp 98.3°F | Resp 16 | Ht 66.2 in | Wt 187.6 lb

## 2024-02-27 DIAGNOSIS — G4733 Obstructive sleep apnea (adult) (pediatric): Secondary | ICD-10-CM

## 2024-02-27 DIAGNOSIS — Z7189 Other specified counseling: Secondary | ICD-10-CM | POA: Diagnosis not present

## 2024-02-27 DIAGNOSIS — J4520 Mild intermittent asthma, uncomplicated: Secondary | ICD-10-CM

## 2024-02-27 NOTE — Progress Notes (Signed)
 [facility] [address] [place], [place] [address]  Pulmonary Sleep Medicine   Office Visit Note  Patient Name: Mike Decker DOB: [birth year] MRN [id number]  Date of Service: 03/10/2024  Complaints/HPI: Pt is here for routine follow up. Has been to ED for chest pain since last visit. He is now on nitroderm patch daily now. Has completed cardiac rehab now. Did not have updated sleep study. Wearing CPAP nightly still and doing well with this. Has some jaw pain and seeing dentist and dx TMJ. SOB only with exertion. Using inhalers as prescribed.  ROS  General: (-) fever, (-) chills, (-) night sweats, (-) weakness Skin: (-) rashes, (-) itching,. Eyes: (-) visual changes, (-) redness, (-) itching. Nose and Sinuses: (-) nasal stuffiness or itchiness, (-) postnasal drip, (-) nosebleeds, (-) sinus trouble. Mouth and Throat: (-) sore throat, (-) hoarseness. Neck: (-) swollen glands, (-) enlarged thyroid, (-) neck pain. Respiratory: - cough, (-) bloody sputum, + shortness of breath, - wheezing. Cardiovascular: - ankle swelling, (-) chest pain. Lymphatic: (-) lymph node enlargement. Neurologic: (-) numbness, (-) tingling. Psychiatric: (-) anxiety, (-) depression   Current Medication: Outpatient Encounter Medications as of 02/27/2024  Medication Sig Note   acetaminophen (TYLENOL) 500 MG tablet Take 1,000 mg by mouth every 8 (eight) hours as needed for mild pain.    atorvastatin (LIPITOR) 80 MG tablet Take 1 tablet (80 mg total) by mouth daily.    azelastine (ASTELIN) 0.1 % nasal spray USE 1 TO 2 SPRAYS IN EACH NOSTRIL TWICE A DAY    calcitRIOL (ROCALTROL) 0.25 MCG capsule Take 0.25 mcg by mouth daily.    cetirizine (ZYRTEC) 5 MG tablet Take 5 mg by mouth daily.    citalopram (CELEXA) 10 MG tablet Take 1 tablet (10 mg total) by mouth daily.    clopidogrel (PLAVIX) 75 MG tablet TAKE 1 TABLET BY MOUTH EVERY DAY    [employer] VITAMIN B12 1000 MCG tablet TAKE 1 TABLET BY MOUTH EVERY  DAY 10/30/2023: Pt taking daily   EPINEPHrine (EPI-PEN) 0.3 mg/0.3 mL SOAJ injection Inject into the muscle as needed (anaphylaxis).    ergocalciferol (VITAMIN D2) 1.25 MG (50000 UT) capsule Take 50,000 Units by mouth every 30 (thirty) days.    ferrous sulfate 325 (65 FE) MG EC tablet TAKE 1 TABLET BY MOUTH EVERY DAY    fluticasone (FLONASE) 50 MCG/ACT nasal spray USE 1 SPRAY IN EACH NOSTRIL EVERY DAY    folic acid (FOLVITE) 400 MCG tablet Take 400 mcg by mouth daily.    insulin aspart (NOVOLOG) 100 UNIT/ML FlexPen 14 UNITS IN AM, 30 UNITS AT LUNCH, 30 UNITS AT DINNER    insulin glargine (LANTUS) 100 UNIT/ML injection Inject 0.35 mLs (35 Units total) into the skin at bedtime. This is a decrease from your previous 45 units nightly. (Patient taking differently: Inject 60 Units into the skin at bedtime.)    isosorbide mononitrate (IMDUR) 120 MG 24 hr tablet Take 1 tablet (120 mg total) by mouth 2 (two) times daily.    JARDIANCE 10 MG TABS tablet Take 10 mg by mouth daily.    magnesium oxide (MAG-OX) 400 (240 Mg) MG tablet Take 1 tablet by mouth daily.    metoprolol succinate (TOPROL-XL) 50 MG 24 hr tablet Take 1 tablet by mouth daily.    mometasone (ASMANEX) 220 MCG/ACT inhaler Inhale 2 puffs into the lungs daily.    montelukast (SINGULAIR) 10 MG tablet TAKE 1 TABLET AT BEDTIME    nitroGLYCERIN (NITRODUR - DOSED  IN MG/24 HR) 0.4 mg/hr patch Place 0.4 mg onto the skin daily.    nitroGLYCERIN (NITROSTAT) 0.4 MG SL tablet Place 0.4 mg under the tongue every 5 (five) minutes x 3 doses as needed for chest pain.    pantoprazole (PROTONIX) 40 MG tablet Take 1 tablet (40 mg total) by mouth 2 (two) times daily before a meal.    ranolazine (RANEXA) 1000 MG SR tablet Take 1 tablet (1,000 mg total) by mouth daily. (Patient taking differently: Take 500 mg by mouth 2 (two) times daily.)    sodium fluoride (DENTA 5000 PLUS) 1.1 % CREA dental cream See admin instructions.    Tiotropium Bromide-Olodaterol (STIOLTO  RESPIMAT) 2.5-2.5 MCG/ACT AERS Inhale 1 each into the lungs daily.    torsemide (DEMADEX) 20 MG tablet Take 40 mg by mouth 2 (two) times daily. Takes 40 mg bid on Monday, Wed and Friday and takes 40 mg once a day all other days    No facility-administered encounter medications on file as of 02/27/2024.    Surgical History: Past Surgical History:  Procedure Laterality Date   CATARACT EXTRACTION  2011, 2012   COLONOSCOPY N/A 12/14/2018   Procedure: COLONOSCOPY;  Surgeon: Pasty Spillers, MD;  Location: [facility] ENDOSCOPY;  Service: Endoscopy;  Laterality: N/A;   CORONARY ANGIOPLASTY WITH STENT PLACEMENT  12/10/2010   CORONARY STENT INTERVENTION N/A 12/29/2019   Procedure: CORONARY STENT INTERVENTION;  Surgeon: Yvonne Kendall, MD;  Location: [facility] INVASIVE CV LAB;  Service: Cardiovascular;  Laterality: N/A;   ESOPHAGOGASTRODUODENOSCOPY N/A 12/14/2018   Procedure: ESOPHAGOGASTRODUODENOSCOPY (EGD);  Surgeon: Pasty Spillers, MD;  Location: [facility] ENDOSCOPY;  Service: Endoscopy;  Laterality: N/A;   ESOPHAGOGASTRODUODENOSCOPY (EGD) WITH PROPOFOL N/A 06/01/2016   Procedure: ESOPHAGOGASTRODUODENOSCOPY (EGD) WITH PROPOFOL;  Surgeon: Scot Jun, MD;  Location: [facility] ENDOSCOPY;  Service: Endoscopy;  Laterality: N/A;   ESOPHAGOGASTRODUODENOSCOPY (EGD) WITH PROPOFOL N/A 01/31/2023   Procedure: ESOPHAGOGASTRODUODENOSCOPY (EGD) WITH PROPOFOL;  Surgeon: Midge Minium, MD;  Location: [facility] ENDOSCOPY;  Service: Endoscopy;  Laterality: N/A;   FEMORAL ARTERY STENT Right    HERNIA REPAIR     x2   INTRAOCULAR LENS INSERTION     LEFT HEART CATH AND CORONARY ANGIOGRAPHY N/A 05/09/2017   Procedure: Left Heart Cath and Coronary Angiography;  Surgeon: Alwyn Pea, MD;  Location: [facility] INVASIVE CV LAB;  Service: Cardiovascular;  Laterality: N/A;   LEFT HEART CATH AND CORONARY ANGIOGRAPHY N/A 12/08/2018   Procedure: LEFT HEART CATH AND CORONARY ANGIOGRAPHY and possible PCI and stent;  Surgeon: Alwyn Pea, MD;  Location: [facility] INVASIVE CV LAB;  Service: Cardiovascular;  Laterality: N/A;   LEFT HEART CATH AND CORONARY ANGIOGRAPHY N/A 12/29/2019   Procedure: LEFT HEART CATH AND CORONARY ANGIOGRAPHY;  Surgeon: Yvonne Kendall, MD;  Location: [facility] INVASIVE CV LAB;  Service: Cardiovascular;  Laterality: N/A;   LEFT HEART CATH AND CORONARY ANGIOGRAPHY Left 12/13/2022   Procedure: LEFT HEART CATH AND CORONARY ANGIOGRAPHY;  Surgeon: Alwyn Pea, MD;  Location: [facility] INVASIVE CV LAB;  Service: Cardiovascular;  Laterality: Left;   NOSE SURGERY     submucous resection   PROSTATE SURGERY  12/10/2000   ROTATOR CUFF REPAIR Right     Medical History: 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, esphageal   Chronic diastolic CHF (congestive heart failure) (  HCC)    Colon polyp    Diabetes mellitus without complication (HCC)    Diverticulosis    Gout    Heart disease    Hemangioma    liver   Hyperlipidemia    Hypertension    Myocardial infarct (HCC)    Ocular hypertension    Peripheral vascular disease (HCC)    Skin cancer    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HCC)    Sleep apnea    Vitreoretinal degeneration     Family History: Family History  Problem Relation Age of Onset   Arthritis Mother    Stroke Maternal Grandfather    Breast cancer Neg Hx     Social History: Social History   Socioeconomic History   Marital status: Married    Spouse name: Not on file   Number of children: 2   Years of education: College 3 years   Highest education level: Associate degree: occupational, Scientist, product/process development, or vocational program  Occupational History   Occupation: retired  Tobacco Use   Smoking status: Former    Current packs/day: 0.00    Types: Cigarettes    Start date: 12/10/1956    Quit date: 12/10/1976    Years since quitting: 47.2   Smokeless tobacco:  Never  Vaping Use   Vaping status: Never Used  Substance and Sexual Activity   Alcohol use: No   Drug use: No   Sexual activity: Not Currently  Other Topics Concern   Not on file  Social History Narrative   Not on file   Social Drivers of Health   Financial Resource Strain: Low Risk  (06/21/2023)   Received from [facility]   Overall Financial Resource Strain (CARDIA)    Difficulty of Paying Living Expenses: Not very hard  Food Insecurity: No Food Insecurity (10/30/2023)   Hunger Vital Sign    Worried About Running Out of Food in the Last Year: Never true    Ran Out of Food in the Last Year: Never true  Transportation Needs: No Transportation Needs (10/30/2023)   PRAPARE - Administrator, Civil Service (Medical): No    Lack of Transportation (Non-Medical): No  Physical Activity: Insufficiently Active (09/15/2021)   Exercise Vital Sign    Days of Exercise per Week: 7 days    Minutes of Exercise per Session: 20 min  Stress: No Stress Concern Present (09/15/2021)   [employer] of Occupational Health - Occupational Stress Questionnaire    Feeling of Stress : Not at all  Social Connections: Moderately Isolated (09/15/2021)   Social Connection and Isolation Panel [NHANES]    Frequency of Communication with Friends and Family: Once a week    Frequency of Social Gatherings with Friends and Family: Once a week    Attends Religious Services: More than 4 times per year    Active Member of [employer] or Organizations: No    Attends Banker Meetings: Never    Marital Status: Married  Catering manager Violence: Not At Risk (10/30/2023)   Humiliation, Afraid, Rape, and Kick questionnaire    Fear of Current or Ex-Partner: No    Emotionally Abused: No    Physically Abused: No    Sexually Abused: No    Vital Signs: Blood pressure (!) 121/55, pulse 96, temperature 98.3 F (36.8 C), resp. rate 16, height 5' 6.2" (1.681 m), weight 187 lb 9.6 oz  (85.1 kg), SpO2 97%.  Examination: General Appearance: The patient is well-developed, well-nourished, and in no distress. Skin: Gross  inspection of skin unremarkable. Head: normocephalic, no gross deformities. Eyes: no gross deformities noted. ENT: ears appear grossly normal no exudates. Neck: Supple. No thyromegaly. No LAD. Respiratory: No rhonchi. Cardiovascular: Normal S1 and S2 without murmur or rub. Extremities: No cyanosis. pulses are equal. Neurologic: Alert and oriented. No involuntary movements.  LABS: Recent Results (from the past 2160 hours)  NM PET CT CARDIAC PERFUSION MULTI W/ABSOLUTE BLOODFLOW     Status: None   Collection Time: 01/02/24 10:21 AM  Result Value Ref Range   Rest Nuclear Isotope Dose 21.0 mCi   Stress Nuclear Isotope Dose 21.0 mCi   Rest HR 70.0 bpm   Rest BP 165/58 mmHg   Peak HR 77 bpm   Peak BP 138/49 mmHg   SSS 18.0    SRS 5.0    TID 1.07    Nuc Stress EF 43 %   Nuc Rest EF 26 %   Rest MBF 0.75 ml/g/min   Stress MBF 0.86 ml/g/min   MBFR 1.15    LV sys vol 51.0 mL   LV dias vol 88.0 62 - 150 mL   ST Depression (mm) 0 mm  Brain natriuretic peptide     Status: Abnormal   Collection Time: 01/22/24 12:43 PM  Result Value Ref Range   B Natriuretic Peptide 197.3 (H) 0.0 - 100.0 pg/mL    Comment: Performed at [facility], [address] [place], [place] [address]  Retic Panel     Status: None   Collection Time: 02/11/24  9:29 AM  Result Value Ref Range   Retic Ct Pct 2.4 0.4 - 3.1 %   RBC. 4.29 4.22 - 5.81 MIL/uL   Retic Count, Absolute 100.8 19.0 - 186.0 K/uL   Immature Retic Fract 7.9 2.3 - 15.9 %   Reticulocyte Hemoglobin 32.3 >27.9 pg    Comment:        Given the high negative predictive value of a RET-He result > 32 pg iron deficiency is essentially excluded. If this patient is anemic other etiologies should be considered. Performed at [facility], [address] [place], [place] [address]   Iron and TIBC      Status: Abnormal   Collection Time: 02/11/24  9:29 AM  Result Value Ref Range   Iron 61 45 - 182 ug/dL   TIBC 295 284 - 132 ug/dL   Saturation Ratios 17 (L) 17.9 - 39.5 %   UIBC 292 ug/dL    Comment: Performed at [facility], [address] [place], [place] [address]  Vitamin B12     Status: None   Collection Time: 02/11/24  9:29 AM  Result Value Ref Range   Vitamin B-12 428 180 - 914 pg/mL    Comment: (NOTE) This assay is not validated for testing neonatal or myeloproliferative syndrome specimens for Vitamin B12 levels. Performed at [facility], [address], [place], [place] [address]   Ferritin     Status: None   Collection Time: 02/11/24  9:29 AM  Result Value Ref Range   Ferritin 76 24 - 336 ng/mL    Comment: Performed at [facility], [address] [place], [place] [address]  CBC with Differential (Cancer Center Only)     Status: Abnormal   Collection Time: 02/11/24  9:29 AM  Result Value Ref Range   WBC Count 9.3 4.0 - 10.5 K/uL   RBC 4.29 4.22 - 5.81 MIL/uL   Hemoglobin 13.4 13.0 - 17.0 g/dL  HCT 40.4 39.0 - 52.0 %   MCV 94.2 80.0 - 100.0 fL   MCH 31.2 26.0 - 34.0 pg   MCHC 33.2 30.0 - 36.0 g/dL   RDW 27.2 53.6 - 64.4 %   Platelet Count 257 150 - 400 K/uL   nRBC 0.0 0.0 - 0.2 %   Neutrophils Relative % 71 %   Neutro Abs 6.5 1.7 - 7.7 K/uL   Lymphocytes Relative 13 %   Lymphs Abs 1.3 0.7 - 4.0 K/uL   Monocytes Relative 10 %   Monocytes Absolute 1.0 0.1 - 1.0 K/uL   Eosinophils Relative 4 %   Eosinophils Absolute 0.4 0.0 - 0.5 K/uL   Basophils Relative 1 %   Basophils Absolute 0.1 0.0 - 0.1 K/uL   Immature Granulocytes 1 %   Abs Immature Granulocytes 0.13 (H) 0.00 - 0.07 K/uL    Comment: Performed at [facility], [address], [place], [place] [address]    Radiology: No results found.  No results found.  No results found.    Assessment and Plan: Patient Active Problem List   Diagnosis Date Noted   Atrial  fibrillation, chronic (HCC) 10/30/2023   Obesity (BMI 30-39.9) 04/24/2023   Moderate aortic stenosis 04/24/2023   Paroxysmal atrial fibrillation with RVR (HCC) 04/24/2023   Bronchiectasis (HCC) 04/24/2023   Hemoptysis 04/24/2023   Acute renal failure superimposed on stage 4 chronic kidney disease (HCC) 04/18/2023   Leukocytosis 04/18/2023   Dysphagia 01/31/2023   Vitamin B12 deficiency 12/26/2022   Type II diabetes mellitus with renal manifestations (HCC) 08/15/2022   Depression 08/15/2022   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   Urinary tract infection without hematuria 11/01/2020   Dysuria 11/01/2020   Hyposmolality and/or hyponatremia 09/14/2020   Cough 09/13/2020   Bronchitis 07/15/2020   Palpitations 07/15/2020   Aortic atherosclerosis (HCC) 06/21/2020   (HFpEF) heart failure with preserved ejection fraction (HCC) 06/21/2020   Otalgia of both ears 05/27/2020   Lobar pneumonia (HCC)    Type 2 diabetes mellitus with stage 4 chronic kidney disease, without long-term current use of insulin (HCC) 12/14/2019   Elevated troponin    Acidosis 10/30/2019   Benign 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 10/30/2019   Hyperkalemia 10/30/2019   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/30/2019   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   Encounter for fitting and adjustment of hearing aid 06/16/2019   Health maintenance alteration 06/16/2019   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/16/2019   DM type 2 with diabetic peripheral neuropathy (HCC) 01/25/2019   CKD (chronic kidney disease) stage 4, GFR 15-29 ml/min (HCC) 01/21/2019   Gout 01/21/2019   MR (mitral regurgitation) 01/21/2019   Chronic obstructive pulmonary disease (HCC) 01/08/2019   OSA (obstructive sleep apnea) 01/08/2019   Acute on chronic diastolic CHF (congestive heart failure) (HCC) 12/30/2018   Polyp of sigmoid colon    Benign neoplasm of ascending colon    Hemorrhoids without  complication    Diverticulosis of large intestine without diverticulitis    Reflux esophagitis    Lymphangiectasia    Arteriovenous malformation of duodenum    Symptomatic anemia    SOB (shortness of breath)    Iron deficiency anemia due to chronic blood loss    Unstable angina (HCC)    Anemia of chronic renal failure, stage 4 (severe) (HCC)    Chest pain 11/14/2018   Sciatica 11/12/2018   CAD (coronary artery disease) 02/27/2018   Sacroiliac joint pain 10/08/2017   Pain  in limb 09/24/2017   Neuropathy 07/24/2017   NSTEMI (non-ST elevated myocardial infarction) (HCC) 05/06/2017   PAD (peripheral artery disease) (HCC) 05/01/2017   History of stroke 01/02/2017   Barrett's esophagus 10/02/2016   Carotid stenosis 10/02/2016   Essential hypertension 10/02/2016   Hyperlipidemia 10/02/2016   Diabetes (HCC) 10/02/2016   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 Chronic asthma, mild intermittent, uncomplicated (Primary) Continue inhalers as prescribed and will update PFT - Pulmonary Function Test; Future  2. OSA on CPAP Continue cpap nightly  3. CPAP use counseling CPAP couseling-Discussed importance of adequate CPAP use as well as proper care and cleaning techniques of machine and all supplies.    General Counseling: I have discussed the findings of the evaluation and examination with Mike Decker.  I have also discussed any further diagnostic evaluation thatmay be needed or ordered today. Mike Decker verbalizes understanding of the findings of todays visit. We also reviewed his medications today and discussed drug interactions and side effects including but not limited excessive drowsiness and altered mental states. We also discussed that there is always a risk not just to him but also people around him. he  has been encouraged to call the office with any questions or concerns that should arise related to todays visit.  Orders Placed This Encounter  Procedures   Pulmonary Function Test    Standing Status:   Future    Expiration Date:   04/28/2024    Where should this test be performed?:   [facility]    Full PFT: includes the following: basic spirometry, spirometry pre & post bronchodilator, diffusion capacity (DLCO), lung volumes:   Full PFT     Time spent: 30  I have personally obtained a history, examined the patient, evaluated laboratory and imaging results, formulated the assessment and plan and placed orders. This patient was seen by Lynn Ito, PA-C in collaboration with Dr. Freda Munro as a part of collaborative care agreement.     Yevonne Pax, MD [facility] Pulmonary and Critical Care Sleep medicine

## 2024-03-18 ENCOUNTER — Telehealth: Payer: Self-pay | Admitting: Internal Medicine

## 2024-03-18 NOTE — Telephone Encounter (Signed)
 Left vm to confirm 03/25/24 appointment-Toni

## 2024-03-24 ENCOUNTER — Ambulatory Visit: Admitting: Nurse Practitioner

## 2024-03-24 DIAGNOSIS — I350 Nonrheumatic aortic (valve) stenosis: Secondary | ICD-10-CM | POA: Diagnosis not present

## 2024-03-24 DIAGNOSIS — I2510 Atherosclerotic heart disease of native coronary artery without angina pectoris: Secondary | ICD-10-CM | POA: Diagnosis not present

## 2024-03-24 DIAGNOSIS — I5032 Chronic diastolic (congestive) heart failure: Secondary | ICD-10-CM | POA: Diagnosis not present

## 2024-03-24 DIAGNOSIS — R0602 Shortness of breath: Secondary | ICD-10-CM | POA: Diagnosis not present

## 2024-03-25 ENCOUNTER — Ambulatory Visit (INDEPENDENT_AMBULATORY_CARE_PROVIDER_SITE_OTHER): Admitting: Internal Medicine

## 2024-03-25 DIAGNOSIS — J4520 Mild intermittent asthma, uncomplicated: Secondary | ICD-10-CM

## 2024-03-30 NOTE — Procedures (Signed)
 [facility] [address] [place], [address]    Complete Pulmonary Function Testing Interpretation:  FINDINGS:    The forced vital capacity is normal.  FEV1 is normal.  FEV1 on FVC ratio is normal.  Post bronchodilator no significant changes noted   Total lung capacity is mildly decreased.  Residual volume is decreased.  FRC is decreased.  The DLCO is mildly decreased.  IMPRESSION:    This pulmonary function studies within normal limits.  Clinical correlation is recommended  Cordie Deters, MD [facility] Pulmonary Critical Care Medicine Sleep Medicine

## 2024-04-07 LAB — PULMONARY FUNCTION TEST

## 2024-04-16 ENCOUNTER — Ambulatory Visit (INDEPENDENT_AMBULATORY_CARE_PROVIDER_SITE_OTHER): Admitting: Physician Assistant

## 2024-04-16 ENCOUNTER — Encounter: Payer: Self-pay | Admitting: Physician Assistant

## 2024-04-16 VITALS — BP 145/60 | HR 73 | Temp 98.3°F | Resp 16 | Ht 66.2 in | Wt 190.6 lb

## 2024-04-16 DIAGNOSIS — Z7189 Other specified counseling: Secondary | ICD-10-CM

## 2024-04-16 DIAGNOSIS — G4733 Obstructive sleep apnea (adult) (pediatric): Secondary | ICD-10-CM

## 2024-04-16 DIAGNOSIS — I350 Nonrheumatic aortic (valve) stenosis: Secondary | ICD-10-CM

## 2024-04-16 DIAGNOSIS — J4520 Mild intermittent asthma, uncomplicated: Secondary | ICD-10-CM | POA: Diagnosis not present

## 2024-04-16 NOTE — Progress Notes (Signed)
 [facility] [address] [place], [place] [address]  Pulmonary Sleep Medicine   Office Visit Note  Patient Name: Mike Decker DOB: 10-Jan-1941 MRN [id number]  Date of Service: 05/06/2024  Complaints/HPI: Pt is here for routine pulmonary follow up and PFT review. PFT normal. Occasional coughing, but overall doing well. Takes zyrtec daily. Saw [facility] specialist and has enother echo planned in next 6months to follow for AS. Wearing CPAP nightly and doing well with this and benefiting from use.  ROS  General: (-) fever, (-) chills, (-) night sweats, (-) weakness Skin: (-) rashes, (-) itching,. Eyes: (-) visual changes, (-) redness, (-) itching. Nose and Sinuses: (-) nasal stuffiness or itchiness, (-) postnasal drip, (-) nosebleeds, (-) sinus trouble. Mouth and Throat: (-) sore throat, (-) hoarseness. Neck: (-) swollen glands, (-) enlarged thyroid , (-) neck pain. Respiratory: - cough, (-) bloody sputum, - shortness of breath, - wheezing. Cardiovascular: - ankle swelling, (-) chest pain. Lymphatic: (-) lymph node enlargement. Neurologic: (-) numbness, (-) tingling. Psychiatric: (-) anxiety, (-) depression   Current Medication: Outpatient Encounter Medications as of 04/16/2024  Medication Sig Note   acetaminophen  (TYLENOL ) 500 MG tablet Take 1,000 mg by mouth every 8 (eight) hours as needed for mild pain. 04/23/2024: prn   atorvastatin  (LIPITOR ) 80 MG tablet Take 1 tablet (80 mg total) by mouth daily.    azelastine  (ASTELIN ) 0.1 % nasal spray USE 1 TO 2 SPRAYS IN EACH NOSTRIL TWICE A DAY    calcitRIOL  (ROCALTROL ) 0.25 MCG capsule Take 0.25 mcg by mouth daily.    cetirizine (ZYRTEC) 5 MG tablet Take 5 mg by mouth daily. 04/23/2024: prn   citalopram  (CELEXA ) 10 MG tablet Take 1 tablet (10 mg total) by mouth daily.    clopidogrel  (PLAVIX ) 75 MG tablet TAKE 1 TABLET BY MOUTH EVERY DAY    [employer] VITAMIN B12 1000 MCG tablet TAKE 1 TABLET BY MOUTH EVERY DAY 10/30/2023:  Pt taking daily   EPINEPHrine  (EPI-PEN) 0.3 mg/0.3 mL SOAJ injection Inject into the muscle as needed (anaphylaxis). 04/23/2024: prn   ergocalciferol  (VITAMIN D2) 1.25 MG (50000 UT) capsule Take 50,000 Units by mouth every 30 (thirty) days.    ferrous sulfate  325 (65 FE) MG EC tablet TAKE 1 TABLET BY MOUTH EVERY DAY    fluticasone  (FLONASE ) 50 MCG/ACT nasal spray USE 1 SPRAY IN EACH NOSTRIL EVERY DAY    insulin  aspart (NOVOLOG ) 100 UNIT/ML FlexPen 14 UNITS IN AM, 30 UNITS AT LUNCH, 30 UNITS AT DINNER    insulin  glargine (LANTUS ) 100 UNIT/ML injection Inject 0.35 mLs (35 Units total) into the skin at bedtime. This is a decrease from your previous 45 units nightly. (Patient taking differently: Inject 60 Units into the skin at bedtime.)    isosorbide  mononitrate (IMDUR ) 120 MG 24 hr tablet Take 1 tablet (120 mg total) by mouth 2 (two) times daily.    JARDIANCE 10 MG TABS tablet Take 10 mg by mouth daily.    magnesium  oxide (MAG-OX) 400 (240 Mg) MG tablet Take 1 tablet by mouth daily.    mometasone  (ASMANEX ) 220 MCG/ACT inhaler Inhale 2 puffs into the lungs daily.    montelukast  (SINGULAIR ) 10 MG tablet TAKE 1 TABLET AT BEDTIME    nitroGLYCERIN  (NITROSTAT ) 0.4 MG SL tablet Place 0.4 mg under the tongue every 5 (five) minutes x 3 doses as needed for chest pain. 04/23/2024: prn   pantoprazole  (PROTONIX ) 40 MG tablet Take 1 tablet (40 mg total) by mouth 2 (two) times daily before  a meal.    sodium fluoride  (DENTA 5000 PLUS) 1.1 % CREA dental cream See admin instructions.    Tiotropium Bromide-Olodaterol (STIOLTO RESPIMAT) 2.5-2.5 MCG/ACT AERS Inhale 1 each into the lungs daily.    torsemide  (DEMADEX ) 20 MG tablet Take 40 mg by mouth 2 (two) times daily.    [DISCONTINUED] folic acid  (FOLVITE ) 400 MCG tablet Take 400 mcg by mouth daily. (Patient not taking: Reported on 04/23/2024)    [DISCONTINUED] metoprolol  succinate (TOPROL -XL) 50 MG 24 hr tablet Take 1 tablet by mouth daily.    [DISCONTINUED]  nitroGLYCERIN  (NITRODUR - DOSED IN MG/24 HR) 0.4 mg/hr patch Place 0.4 mg onto the skin daily. 04/23/2024: 12 hours on 12 hours off daily   [DISCONTINUED] ranolazine  (RANEXA ) 1000 MG SR tablet Take 1 tablet (1,000 mg total) by mouth daily. (Patient taking differently: Take 500 mg by mouth 2 (two) times daily.)    No facility-administered encounter medications on file as of 04/16/2024.    Surgical History: Past Surgical History:  Procedure Laterality Date   CATARACT EXTRACTION  2011, 2012   COLONOSCOPY N/A 12/14/2018   Procedure: COLONOSCOPY;  Surgeon: Irby Mannan, MD;  Location: [facility] ENDOSCOPY;  Service: Endoscopy;  Laterality: N/A;   CORONARY ANGIOPLASTY WITH STENT PLACEMENT  12/10/2010   CORONARY STENT INTERVENTION N/A 12/29/2019   Procedure: CORONARY STENT INTERVENTION;  Surgeon: Sammy Crisp, MD;  Location: [facility] INVASIVE CV LAB;  Service: Cardiovascular;  Laterality: N/A;   ESOPHAGOGASTRODUODENOSCOPY N/A 12/14/2018   Procedure: ESOPHAGOGASTRODUODENOSCOPY (EGD);  Surgeon: Irby Mannan, MD;  Location: [facility] ENDOSCOPY;  Service: Endoscopy;  Laterality: N/A;   ESOPHAGOGASTRODUODENOSCOPY (EGD) WITH PROPOFOL  N/A 06/01/2016   Procedure: ESOPHAGOGASTRODUODENOSCOPY (EGD) WITH PROPOFOL ;  Surgeon: Cassie Click, MD;  Location: [facility] ENDOSCOPY;  Service: Endoscopy;  Laterality: N/A;   ESOPHAGOGASTRODUODENOSCOPY (EGD) WITH PROPOFOL  N/A 01/31/2023   Procedure: ESOPHAGOGASTRODUODENOSCOPY (EGD) WITH PROPOFOL ;  Surgeon: Marnee Sink, MD;  Location: [facility] ENDOSCOPY;  Service: Endoscopy;  Laterality: N/A;   FEMORAL ARTERY STENT Right    HERNIA REPAIR     x2   INTRAOCULAR LENS INSERTION     LEFT HEART CATH AND CORONARY ANGIOGRAPHY N/A 05/09/2017   Procedure: Left Heart Cath and Coronary Angiography;  Surgeon: Antonette Batters, MD;  Location: [facility] INVASIVE CV LAB;  Service: Cardiovascular;  Laterality: N/A;   LEFT HEART CATH AND CORONARY ANGIOGRAPHY N/A 12/08/2018   Procedure: LEFT  HEART CATH AND CORONARY ANGIOGRAPHY and possible PCI and stent;  Surgeon: Antonette Batters, MD;  Location: [facility] INVASIVE CV LAB;  Service: Cardiovascular;  Laterality: N/A;   LEFT HEART CATH AND CORONARY ANGIOGRAPHY N/A 12/29/2019   Procedure: LEFT HEART CATH AND CORONARY ANGIOGRAPHY;  Surgeon: Sammy Crisp, MD;  Location: [facility] INVASIVE CV LAB;  Service: Cardiovascular;  Laterality: N/A;   LEFT HEART CATH AND CORONARY ANGIOGRAPHY Left 12/13/2022   Procedure: LEFT HEART CATH AND CORONARY ANGIOGRAPHY;  Surgeon: Antonette Batters, MD;  Location: [facility] INVASIVE CV LAB;  Service: Cardiovascular;  Laterality: Left;   NOSE SURGERY     submucous resection   PROSTATE SURGERY  12/10/2000   ROTATOR CUFF REPAIR Right     Medical History: 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, esphageal   Chronic diastolic CHF (congestive heart failure) (HCC)    Colon polyp    Diabetes mellitus  without complication (HCC)    Diverticulosis    Gout    Heart disease    Hemangioma    liver   Hyperlipidemia    Hypertension    Myocardial infarct (HCC)    Ocular hypertension    Peripheral vascular disease (HCC)    Skin cancer    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HCC)    Sleep apnea    Vitreoretinal degeneration     Family History: Family History  Problem Relation Age of Onset   Arthritis Mother    Stroke Maternal Grandfather    Breast cancer Neg Hx     Social History: Social History   Socioeconomic History   Marital status: Married    Spouse name: Not on file   Number of children: 2   Years of education: College 3 years   Highest education level: Associate degree: occupational, Scientist, product/process development, or vocational program  Occupational History   Occupation: retired  Tobacco Use   Smoking status: Former    Current packs/day: 0.00    Types: Cigarettes    Start date:  12/10/1956    Quit date: 12/10/1976    Years since quitting: 47.4   Smokeless tobacco: Never  Vaping Use   Vaping status: Never Used  Substance and Sexual Activity   Alcohol use: No   Drug use: No   Sexual activity: Not Currently  Other Topics Concern   Not on file  Social History Narrative   Not on file   Social Drivers of Health   Financial Resource Strain: Low Risk  (06/21/2023)   Received from [facility]   Overall Financial Resource Strain (CARDIA)    Difficulty of Paying Living Expenses: Not very hard  Food Insecurity: No Food Insecurity (04/23/2024)   Hunger Vital Sign    Worried About Running Out of Food in the Last Year: Never true    Ran Out of Food in the Last Year: Never true  Transportation Needs: No Transportation Needs (04/23/2024)   PRAPARE - Administrator, Civil Service (Medical): No    Lack of Transportation (Non-Medical): No  Physical Activity: Insufficiently Active (09/15/2021)   Exercise Vital Sign    Days of Exercise per Week: 7 days    Minutes of Exercise per Session: 20 min  Stress: No Stress Concern Present (09/15/2021)   [employer] of Occupational Health - Occupational Stress Questionnaire    Feeling of Stress : Not at all  Social Connections: Moderately Isolated (04/23/2024)   Social Connection and Isolation Panel [NHANES]    Frequency of Communication with Friends and Family: Once a week    Frequency of Social Gatherings with Friends and Family: Once a week    Attends Religious Services: More than 4 times per year    Active Member of [employer] or Organizations: No    Attends Banker Meetings: Never    Marital Status: Married  Catering manager Violence: Not At Risk (04/23/2024)   Humiliation, Afraid, Rape, and Kick questionnaire    Fear of Current or Ex-Partner: No    Emotionally Abused: No    Physically Abused: No    Sexually Abused: No    Vital Signs: Blood pressure (!) 145/60, pulse 73, temperature  98.3 F (36.8 C), resp. rate 16, height 5' 6.2" (1.681 m), weight 190 lb 9.6 oz (86.5 kg), SpO2 97%.  Examination: General Appearance: The patient is well-developed, well-nourished, and in no distress. Skin: Gross inspection of skin unremarkable. Head: normocephalic, no gross deformities. Eyes: no  gross deformities noted. ENT: ears appear grossly normal no exudates. Neck: Supple. No thyromegaly. No LAD. Respiratory: No rhonchi. Cardiovascular: Normal S1 and S2 without murmur or rub. Extremities: No cyanosis. pulses are equal. Neurologic: Alert and oriented. No involuntary movements.  LABS: Recent Results (from the past 2160 hours)  Retic Panel     Status: None   Collection Time: 02/11/24  9:29 AM  Result Value Ref Range   Retic Ct Pct 2.4 0.4 - 3.1 %   RBC. 4.29 4.22 - 5.81 MIL/uL   Retic Count, Absolute 100.8 19.0 - 186.0 K/uL   Immature Retic Fract 7.9 2.3 - 15.9 %   Reticulocyte Hemoglobin 32.3 >27.9 pg    Comment:        Given the high negative predictive value of a RET-He result > 32 pg iron  deficiency is essentially excluded. If this patient is anemic other etiologies should be considered. Performed at [facility], [address] [place], [place] [address]   Iron  and TIBC     Status: Abnormal   Collection Time: 02/11/24  9:29 AM  Result Value Ref Range   Iron  61 45 - 182 ug/dL   TIBC 244 010 - 272 ug/dL   Saturation Ratios 17 (L) 17.9 - 39.5 %   UIBC 292 ug/dL    Comment: Performed at [facility], [address] [place], [place] [address]  Vitamin B12     Status: None   Collection Time: 02/11/24  9:29 AM  Result Value Ref Range   Vitamin B-12 428 180 - 914 pg/mL    Comment: (NOTE) This assay is not validated for testing neonatal or myeloproliferative syndrome specimens for Vitamin B12 levels. Performed at [facility], [address] [place], [place] [address]   Ferritin     Status: None   Collection Time: 02/11/24  9:29 AM   Result Value Ref Range   Ferritin 76 24 - 336 ng/mL    Comment: Performed at [facility], [address] [place], [place] [address]  CBC with Differential (Cancer Center Only)     Status: Abnormal   Collection Time: 02/11/24  9:29 AM  Result Value Ref Range   WBC Count 9.3 4.0 - 10.5 K/uL   RBC 4.29 4.22 - 5.81 MIL/uL   Hemoglobin 13.4 13.0 - 17.0 g/dL   HCT 63.8 75.6 - 43.3 %   MCV 94.2 80.0 - 100.0 fL   MCH 31.2 26.0 - 34.0 pg   MCHC 33.2 30.0 - 36.0 g/dL   RDW 29.5 18.8 - 41.6 %   Platelet Count 257 150 - 400 K/uL   nRBC 0.0 0.0 - 0.2 %   Neutrophils Relative % 71 %   Neutro Abs 6.5 1.7 - 7.7 K/uL   Lymphocytes Relative 13 %   Lymphs Abs 1.3 0.7 - 4.0 K/uL   Monocytes Relative 10 %   Monocytes Absolute 1.0 0.1 - 1.0 K/uL   Eosinophils Relative 4 %   Eosinophils Absolute 0.4 0.0 - 0.5 K/uL   Basophils Relative 1 %   Basophils Absolute 0.1 0.0 - 0.1 K/uL   Immature Granulocytes 1 %   Abs Immature Granulocytes 0.13 (H) 0.00 - 0.07 K/uL    Comment: Performed at [facility], [address] [place], [place] [address]  Pulmonary Function Test     Status: None   Collection Time: 04/07/24  9:43 AM  Result Value Ref Range   FEV1     FVC  FEV1/FVC     TLC     DLCO    CBC with Differential     Status: Abnormal   Collection Time: 04/23/24  3:28 AM  Result Value Ref Range   WBC 9.1 4.0 - 10.5 K/uL   RBC 3.80 (L) 4.22 - 5.81 MIL/uL   Hemoglobin 12.1 (L) 13.0 - 17.0 g/dL   HCT 29.5 (L) 62.1 - 30.8 %   MCV 94.7 80.0 - 100.0 fL   MCH 31.8 26.0 - 34.0 pg   MCHC 33.6 30.0 - 36.0 g/dL   RDW 65.7 84.6 - 96.2 %   Platelets 274 150 - 400 K/uL   nRBC 0.0 0.0 - 0.2 %   Neutrophils Relative % 71 %   Neutro Abs 6.5 1.7 - 7.7 K/uL   Lymphocytes Relative 13 %   Lymphs Abs 1.2 0.7 - 4.0 K/uL   Monocytes Relative 8 %   Monocytes Absolute 0.7 0.1 - 1.0 K/uL   Eosinophils Relative 6 %   Eosinophils Absolute 0.5 0.0 - 0.5 K/uL   Basophils Relative 1 %   Basophils  Absolute 0.1 0.0 - 0.1 K/uL   Immature Granulocytes 1 %   Abs Immature Granulocytes 0.08 (H) 0.00 - 0.07 K/uL    Comment: Performed at [facility], [address] [place], [place] [address]  Comprehensive metabolic panel     Status: Abnormal   Collection Time: 04/23/24  3:28 AM  Result Value Ref Range   Sodium 134 (L) 135 - 145 mmol/L   Potassium 4.2 3.5 - 5.1 mmol/L   Chloride 99 98 - 111 mmol/L   CO2 23 22 - 32 mmol/L   Glucose, Bld 250 (H) 70 - 99 mg/dL    Comment: Glucose reference range applies only to samples taken after fasting for at least 8 hours.   BUN 61 (H) 8 - 23 mg/dL   Creatinine, Ser 1.32 (H) 0.61 - 1.24 mg/dL   Calcium  8.6 (L) 8.9 - 10.3 mg/dL   Total Protein 6.8 6.5 - 8.1 g/dL   Albumin  3.6 3.5 - 5.0 g/dL   AST 19 15 - 41 U/L   ALT 16 0 - 44 U/L   Alkaline Phosphatase 51 38 - 126 U/L   Total Bilirubin 0.7 0.0 - 1.2 mg/dL   GFR, Estimated 21 (L) >60 mL/min    Comment: (NOTE) Calculated using the CKD-EPI Creatinine Equation (2021)    Anion gap 12 5 - 15    Comment: Performed at [facility], [address] [place], [place] [address]  Lactic acid, plasma     Status: Abnormal   Collection Time: 04/23/24  3:28 AM  Result Value Ref Range   Lactic Acid, Venous 2.0 (HH) 0.5 - 1.9 mmol/L    Comment: CRITICAL RESULT CALLED TO, READ BACK BY AND VERIFIED WITH [place], [place] @0409  on 04/23/24 skl Performed at [facility], [address], [place], [place] [address]   Troponin I (High Sensitivity)     Status: Abnormal   Collection Time: 04/23/24  3:28 AM  Result Value Ref Range   Troponin I (High Sensitivity) 24 (H) <18 ng/L    Comment: (NOTE) Elevated high sensitivity troponin I (hsTnI) values and significant  changes across serial measurements may suggest ACS but many other  chronic and acute conditions are known to elevate hsTnI results.  Refer to the "Links" section for chest pain algorithms and additional  guidance. Performed  at [facility], [address] [place],  [place] [address]   Lactic acid, plasma     Status: None   Collection Time: 04/23/24  4:50 AM  Result Value Ref Range   Lactic Acid, Venous 1.5 0.5 - 1.9 mmol/L    Comment: Performed at [facility], [address] [place], [place] [address]  Culture, blood (routine x 2)     Status: None   Collection Time: 04/23/24  4:50 AM   Specimen: BLOOD RIGHT ARM  Result Value Ref Range   Specimen Description BLOOD RIGHT ARM    Special Requests      BOTTLES DRAWN AEROBIC AND ANAEROBIC Blood Culture adequate volume   Culture      NO GROWTH 5 DAYS Performed at [facility], [address] [place], [place] [address]    Report Status 04/28/2024 FINAL   Culture, blood (routine x 2)     Status: Abnormal   Collection Time: 04/23/24  4:50 AM   Specimen: BLOOD LEFT ARM  Result Value Ref Range   Specimen Description      BLOOD LEFT ARM Performed at [facility], [address], [place], [place] [address]    Special Requests      BOTTLES DRAWN AEROBIC AND ANAEROBIC Blood Culture adequate volume Performed at [facility], [address], [place], [place] [address]    Culture  Setup Time      GRAM POSITIVE COCCI AEROBIC BOTTLE ONLY Organism ID to follow CRITICAL RESULT CALLED TO, READ BACK BY AND VERIFIED WITH: ALEX CHAPPELL @1033  04/24/24 MJU Performed at [facility], [address] [place], [place] [address]    Culture (A)     STAPHYLOCOCCUS EPIDERMIDIS THE SIGNIFICANCE OF ISOLATING THIS ORGANISM FROM A SINGLE SET OF BLOOD CULTURES WHEN MULTIPLE SETS ARE DRAWN IS UNCERTAIN. PLEASE NOTIFY THE MICROBIOLOGY DEPARTMENT WITHIN ONE WEEK IF SPECIATION AND SENSITIVITIES ARE REQUIRED. Performed at [facility], [address], [place], [place] [address]    Report Status 04/25/2024 FINAL   Urinalysis, w/ Reflex to Culture (Infection Suspected) -Urine, Clean Catch     Status: Abnormal    Collection Time: 04/23/24  4:50 AM  Result Value Ref Range   Specimen Source URINE, CLEAN CATCH    Color, Urine STRAW (A) YELLOW   APPearance CLEAR (A) CLEAR   Specific Gravity, Urine 1.010 1.005 - 1.030   pH 6.0 5.0 - 8.0   Glucose, UA >=500 (A) NEGATIVE mg/dL   Hgb urine dipstick NEGATIVE NEGATIVE   Bilirubin Urine NEGATIVE NEGATIVE   Ketones, ur NEGATIVE NEGATIVE mg/dL   Protein, ur NEGATIVE NEGATIVE mg/dL   Nitrite NEGATIVE NEGATIVE   Leukocytes,Ua NEGATIVE NEGATIVE   RBC / HPF 0 0 - 5 RBC/hpf   WBC, UA 0-5 0 - 5 WBC/hpf    Comment:        Reflex urine culture not performed if WBC <=10, OR if Squamous epithelial cells >5. If Squamous epithelial cells >5 suggest recollection.    Bacteria, UA RARE (A) NONE SEEN   Squamous Epithelial / HPF 0 0 - 5 /HPF    Comment: Performed at [facility], [address] [place], [place] [address]  Resp panel by RT-PCR (RSV, Flu A&B, Covid) Urine, Clean Catch     Status: None   Collection Time: 04/23/24  4:50 AM   Specimen: Urine, Clean Catch; Nasal Swab  Result Value Ref Range   SARS Coronavirus 2 by RT PCR NEGATIVE NEGATIVE    Comment: (NOTE) SARS-CoV-2 target nucleic acids  are NOT DETECTED.  The SARS-CoV-2 RNA is generally detectable in upper respiratory specimens during the acute phase of infection. The lowest concentration of SARS-CoV-2 viral copies this assay can detect is 138 copies/mL. A negative result does not preclude SARS-Cov-2 infection and should not be used as the sole basis for treatment or other patient management decisions. A negative result may occur with  improper specimen collection/handling, submission of specimen other than nasopharyngeal swab, presence of viral mutation(s) within the areas targeted by this assay, and inadequate number of viral copies(<138 copies/mL). A negative result must be combined with clinical observations, patient history, and epidemiological information. The expected result is  Negative.  Fact Sheet for Patients:  BloggerCourse.com  Fact Sheet for Healthcare Providers:  SeriousBroker.it  This test is no t yet approved or cleared by the [place]  FDA and  has been authorized for detection and/or diagnosis of SARS-CoV-2 by FDA under an Emergency Use Authorization (EUA). This EUA will remain  in effect (meaning this test can be used) for the duration of the COVID-19 declaration under Section 564(b)(1) of the Act, 21 U.S.C.section 360bbb-3(b)(1), unless the authorization is terminated  or revoked sooner.       Influenza A by PCR NEGATIVE NEGATIVE   Influenza B by PCR NEGATIVE NEGATIVE    Comment: (NOTE) The Xpert Xpress SARS-CoV-2/FLU/RSV plus assay is intended as an aid in the diagnosis of influenza from Nasopharyngeal swab specimens and should not be used as a sole basis for treatment. Nasal washings and aspirates are unacceptable for Xpert Xpress SARS-CoV-2/FLU/RSV testing.  Fact Sheet for Patients: BloggerCourse.com  Fact Sheet for Healthcare Providers: SeriousBroker.it  This test is not yet approved or cleared by the [place]  FDA and has been authorized for detection and/or diagnosis of SARS-CoV-2 by FDA under an Emergency Use Authorization (EUA). This EUA will remain in effect (meaning this test can be used) for the duration of the COVID-19 declaration under Section 564(b)(1) of the Act, 21 U.S.C. section 360bbb-3(b)(1), unless the authorization is terminated or revoked.     Resp Syncytial Virus by PCR NEGATIVE NEGATIVE    Comment: (NOTE) Fact Sheet for Patients: BloggerCourse.com  Fact Sheet for Healthcare Providers: SeriousBroker.it  This test is not yet approved or cleared by the [place]  FDA and has been authorized for detection and/or diagnosis of SARS-CoV-2 by FDA under an  Emergency Use Authorization (EUA). This EUA will remain in effect (meaning this test can be used) for the duration of the COVID-19 declaration under Section 564(b)(1) of the Act, 21 U.S.C. section 360bbb-3(b)(1), unless the authorization is terminated or revoked.  Performed at [facility], [address] [place], [place] [address]   Blood Culture ID Panel (Reflexed)     Status: Abnormal   Collection Time: 04/23/24  4:50 AM  Result Value Ref Range   Enterococcus faecalis NOT DETECTED NOT DETECTED   Enterococcus Faecium NOT DETECTED NOT DETECTED   Listeria monocytogenes NOT DETECTED NOT DETECTED   Staphylococcus species DETECTED (A) NOT DETECTED    Comment: CRITICAL RESULT CALLED TO, READ BACK BY AND VERIFIED WITH: ALEX CHAPPELL @1033  04/24/24 MJU    Staphylococcus aureus (BCID) NOT DETECTED NOT DETECTED   Staphylococcus epidermidis DETECTED (A) NOT DETECTED    Comment: Methicillin (oxacillin) resistant coagulase negative staphylococcus. Possible blood culture contaminant (unless isolated from more than one blood culture draw or clinical case suggests pathogenicity). No antibiotic treatment is indicated for blood  culture contaminants. CRITICAL RESULT CALLED TO, READ BACK BY AND VERIFIED  WITH: ALEX CHAPPELL @1033  04/24/24 MJU    Staphylococcus lugdunensis NOT DETECTED NOT DETECTED   Streptococcus species NOT DETECTED NOT DETECTED   Streptococcus agalactiae NOT DETECTED NOT DETECTED   Streptococcus pneumoniae NOT DETECTED NOT DETECTED   Streptococcus pyogenes NOT DETECTED NOT DETECTED   A.calcoaceticus-baumannii NOT DETECTED NOT DETECTED   Bacteroides fragilis NOT DETECTED NOT DETECTED   Enterobacterales NOT DETECTED NOT DETECTED   Enterobacter cloacae complex NOT DETECTED NOT DETECTED   Escherichia coli NOT DETECTED NOT DETECTED   Klebsiella aerogenes NOT DETECTED NOT DETECTED   Klebsiella oxytoca NOT DETECTED NOT DETECTED   Klebsiella pneumoniae NOT DETECTED NOT  DETECTED   Proteus species NOT DETECTED NOT DETECTED   Salmonella species NOT DETECTED NOT DETECTED   Serratia marcescens NOT DETECTED NOT DETECTED   Haemophilus influenzae NOT DETECTED NOT DETECTED   Neisseria meningitidis NOT DETECTED NOT DETECTED   Pseudomonas aeruginosa NOT DETECTED NOT DETECTED   Stenotrophomonas maltophilia NOT DETECTED NOT DETECTED   Candida albicans NOT DETECTED NOT DETECTED   Candida auris NOT DETECTED NOT DETECTED   Candida glabrata NOT DETECTED NOT DETECTED   Candida krusei NOT DETECTED NOT DETECTED   Candida parapsilosis NOT DETECTED NOT DETECTED   Candida tropicalis NOT DETECTED NOT DETECTED   Cryptococcus neoformans/gattii NOT DETECTED NOT DETECTED   Methicillin resistance mecA/C DETECTED (A) NOT DETECTED    Comment: CRITICAL RESULT CALLED TO, READ BACK BY AND VERIFIED WITH: ALEX CHAPPELL @1033  04/24/24 MJU Performed at [facility], [address] [address] [place], [place] [address]   Lipase, blood     Status: None   Collection Time: 04/23/24  5:13 AM  Result Value Ref Range   Lipase 39 11 - 51 U/L    Comment: Performed at [facility], [address] [place], [place] [address]  Troponin I (High Sensitivity)     Status: Abnormal   Collection Time: 04/23/24  5:13 AM  Result Value Ref Range   Troponin I (High Sensitivity) 37 (H) <18 ng/L    Comment: (NOTE) Elevated high sensitivity troponin I (hsTnI) values and significant  changes across serial measurements may suggest ACS but many other  chronic and acute conditions are known to elevate hsTnI results.  Refer to the "Links" section for chest pain algorithms and additional  guidance. Performed at [facility], [address] [place], [place] [address]   Lipid panel     Status: Abnormal   Collection Time: 04/23/24  5:13 AM  Result Value Ref Range   Cholesterol 141 0 - 200 mg/dL   Triglycerides 914 (H) <150 mg/dL   HDL 24 (L) >78 mg/dL   Total CHOL/HDL Ratio 5.9 RATIO    VLDL 74 (H) 0 - 40 mg/dL   LDL Cholesterol 43 0 - 99 mg/dL    Comment:        Total Cholesterol/HDL:CHD Risk Coronary Heart Disease Risk Table                     Men   Women  1/2 Average Risk   3.4   3.3  Average Risk       5.0   4.4  2 X Average Risk   9.6   7.1  3 X Average Risk  23.4   11.0        Use the calculated Patient Ratio above and the CHD Risk Table to determine the patient's CHD Risk.        ATP III CLASSIFICATION (LDL):  <  100     mg/dL   Optimal  161-096  mg/dL   Near or Above                    Optimal  130-159  mg/dL   Borderline  045-409  mg/dL   High  >811     mg/dL   Very High Performed at [facility], [address] [place], [place] [address]   CBG monitoring, ED     Status: Abnormal   Collection Time: 04/23/24 11:52 AM  Result Value Ref Range   Glucose-Capillary 104 (H) 70 - 99 mg/dL    Comment: Glucose reference range applies only to samples taken after fasting for at least 8 hours.  Glucose, capillary     Status: Abnormal   Collection Time: 04/23/24  5:51 PM  Result Value Ref Range   Glucose-Capillary 178 (H) 70 - 99 mg/dL    Comment: Glucose reference range applies only to samples taken after fasting for at least 8 hours.  Glucose, capillary     Status: Abnormal   Collection Time: 04/23/24  9:41 PM  Result Value Ref Range   Glucose-Capillary 178 (H) 70 - 99 mg/dL    Comment: Glucose reference range applies only to samples taken after fasting for at least 8 hours.   Comment 1 Notify RN    Comment 2 Document in Chart   Lipoprotein A (LPA)     Status: Abnormal   Collection Time: 04/24/24  5:41 AM  Result Value Ref Range   Lipoprotein (a) 177.2 (H) <75.0 nmol/L    Comment: (NOTE) Note:  Values greater than or equal to 75.0 nmol/L may       indicate an independent risk factor for CHD,       but must be evaluated with caution when applied       to non-Caucasian populations due to the       influence of genetic factors on Lp(a) across        ethnicities. Performed At: [facility] [address] [place], [place] [id number] Pearlean Botts MD MV:[id number]   Basic metabolic panel with GFR     Status: Abnormal   Collection Time: 04/24/24  5:41 AM  Result Value Ref Range   Sodium 138 135 - 145 mmol/L   Potassium 4.1 3.5 - 5.1 mmol/L   Chloride 103 98 - 111 mmol/L   CO2 25 22 - 32 mmol/L   Glucose, Bld 133 (H) 70 - 99 mg/dL    Comment: Glucose reference range applies only to samples taken after fasting for at least 8 hours.   BUN 57 (H) 8 - 23 mg/dL   Creatinine, Ser 8.41 (H) 0.61 - 1.24 mg/dL   Calcium  9.0 8.9 - 10.3 mg/dL   GFR, Estimated 21 (L) >60 mL/min    Comment: (NOTE) Calculated using the CKD-EPI Creatinine Equation (2021)    Anion gap 10 5 - 15    Comment: Performed at [facility], [address] [place], [place] [address]  CBC     Status: Abnormal   Collection Time: 04/24/24  5:41 AM  Result Value Ref Range   WBC 7.1 4.0 - 10.5 K/uL   RBC 3.41 (L) 4.22 - 5.81 MIL/uL   Hemoglobin 10.6 (L) 13.0 - 17.0 g/dL   HCT 10.2 (L) 72.5 - 36.6 %   MCV 95.9 80.0 - 100.0 fL   MCH 31.1 26.0 - 34.0 pg   MCHC 32.4 30.0 -  36.0 g/dL   RDW 16.1 09.6 - 04.5 %   Platelets 247 150 - 400 K/uL   nRBC 0.0 0.0 - 0.2 %    Comment: Performed at [facility], [address] [place], [place] [address]  Magnesium      Status: None   Collection Time: 04/24/24  5:41 AM  Result Value Ref Range   Magnesium  2.2 1.7 - 2.4 mg/dL    Comment: Performed at [facility], [address] [place], [place] [address]  ECHOCARDIOGRAM COMPLETE     Status: None   Collection Time: 04/24/24  7:49 AM  Result Value Ref Range   Weight 2,880 oz   Height 66 in   BP 115/52 mmHg   Ao pk vel 3.16 m/s   AV Area VTI 1.20 cm2   AR max vel 1.06 cm2   AV Mean grad 25.3 mmHg   AV Peak grad 39.9 mmHg   S' Lateral 3.00 cm   AV Area mean vel 0.94 cm2   Area-P 1/2 3.06 cm2   MV VTI 3.32 cm2   Est EF 60 - 65%    Glucose, capillary     Status: Abnormal   Collection Time: 04/24/24  8:27 AM  Result Value Ref Range   Glucose-Capillary 135 (H) 70 - 99 mg/dL    Comment: Glucose reference range applies only to samples taken after fasting for at least 8 hours.   Comment 1 Notify RN   Glucose, capillary     Status: Abnormal   Collection Time: 04/24/24 11:35 AM  Result Value Ref Range   Glucose-Capillary 241 (H) 70 - 99 mg/dL    Comment: Glucose reference range applies only to samples taken after fasting for at least 8 hours.   Comment 1 Notify RN   Glucose, capillary     Status: Abnormal   Collection Time: 04/24/24  4:49 PM  Result Value Ref Range   Glucose-Capillary 185 (H) 70 - 99 mg/dL    Comment: Glucose reference range applies only to samples taken after fasting for at least 8 hours.   Comment 1 Notify RN   Glucose, capillary     Status: Abnormal   Collection Time: 04/24/24  9:08 PM  Result Value Ref Range   Glucose-Capillary 216 (H) 70 - 99 mg/dL    Comment: Glucose reference range applies only to samples taken after fasting for at least 8 hours.  CBC     Status: Abnormal   Collection Time: 04/25/24  5:33 AM  Result Value Ref Range   WBC 8.3 4.0 - 10.5 K/uL   RBC 3.28 (L) 4.22 - 5.81 MIL/uL   Hemoglobin 10.3 (L) 13.0 - 17.0 g/dL   HCT 82.9 (L) 56.2 - 13.0 %   MCV 94.5 80.0 - 100.0 fL   MCH 31.4 26.0 - 34.0 pg   MCHC 33.2 30.0 - 36.0 g/dL   RDW 86.5 78.4 - 69.6 %   Platelets 274 150 - 400 K/uL   nRBC 0.0 0.0 - 0.2 %    Comment: Performed at [facility], [address], [place], [place] [address]  Basic metabolic panel with GFR     Status: Abnormal   Collection Time: 04/25/24  5:33 AM  Result Value Ref Range   Sodium 137 135 - 145 mmol/L   Potassium 3.8 3.5 - 5.1 mmol/L   Chloride 102 98 - 111 mmol/L   CO2 23 22 - 32 mmol/L   Glucose, Bld 154 (H) 70 - 99  mg/dL    Comment: Glucose reference range applies only to samples taken after fasting for at least 8 hours.   BUN 66  (H) 8 - 23 mg/dL   Creatinine, Ser 1.61 (H) 0.61 - 1.24 mg/dL   Calcium  8.8 (L) 8.9 - 10.3 mg/dL   GFR, Estimated 19 (L) >60 mL/min    Comment: (NOTE) Calculated using the CKD-EPI Creatinine Equation (2021)    Anion gap 12 5 - 15    Comment: Performed at [facility], [address] [place], [place] [address]  Glucose, capillary     Status: Abnormal   Collection Time: 04/25/24  8:49 AM  Result Value Ref Range   Glucose-Capillary 194 (H) 70 - 99 mg/dL    Comment: Glucose reference range applies only to samples taken after fasting for at least 8 hours.  Glucose, capillary     Status: Abnormal   Collection Time: 04/25/24 12:29 PM  Result Value Ref Range   Glucose-Capillary 156 (H) 70 - 99 mg/dL    Comment: Glucose reference range applies only to samples taken after fasting for at least 8 hours.  Glucose, capillary     Status: Abnormal   Collection Time: 04/25/24  5:02 PM  Result Value Ref Range   Glucose-Capillary 269 (H) 70 - 99 mg/dL    Comment: Glucose reference range applies only to samples taken after fasting for at least 8 hours.  Glucose, capillary     Status: Abnormal   Collection Time: 04/25/24  8:54 PM  Result Value Ref Range   Glucose-Capillary 262 (H) 70 - 99 mg/dL    Comment: Glucose reference range applies only to samples taken after fasting for at least 8 hours.  Glucose, capillary     Status: Abnormal   Collection Time: 04/26/24  8:27 AM  Result Value Ref Range   Glucose-Capillary 217 (H) 70 - 99 mg/dL    Comment: Glucose reference range applies only to samples taken after fasting for at least 8 hours.  CBC     Status: Abnormal   Collection Time: 04/26/24  9:36 AM  Result Value Ref Range   WBC 11.2 (H) 4.0 - 10.5 K/uL   RBC 3.65 (L) 4.22 - 5.81 MIL/uL   Hemoglobin 11.3 (L) 13.0 - 17.0 g/dL   HCT 54.0 (L) 98.1 - 19.1 %   MCV 95.6 80.0 - 100.0 fL   MCH 31.0 26.0 - 34.0 pg   MCHC 32.4 30.0 - 36.0 g/dL   RDW 47.8 29.5 - 62.1 %   Platelets 264 150 - 400  K/uL   nRBC 0.0 0.0 - 0.2 %    Comment: Performed at [facility], [address], [place], [place] [address]  Basic metabolic panel     Status: Abnormal   Collection Time: 04/26/24  9:36 AM  Result Value Ref Range   Sodium 131 (L) 135 - 145 mmol/L   Potassium 4.1 3.5 - 5.1 mmol/L   Chloride 98 98 - 111 mmol/L   CO2 22 22 - 32 mmol/L   Glucose, Bld 257 (H) 70 - 99 mg/dL    Comment: Glucose reference range applies only to samples taken after fasting for at least 8 hours.   BUN 78 (H) 8 - 23 mg/dL   Creatinine, Ser 7.84 (H) 0.61 - 1.24 mg/dL   Calcium  8.8 (L) 8.9 - 10.3 mg/dL   GFR, Estimated 20 (L) >60 mL/min    Comment: (NOTE) Calculated using the CKD-EPI Creatinine Equation (2021)  Anion gap 11 5 - 15    Comment: Performed at [facility], [address] [place], [place] [address]  Glucose, capillary     Status: Abnormal   Collection Time: 04/26/24 11:53 AM  Result Value Ref Range   Glucose-Capillary 238 (H) 70 - 99 mg/dL    Comment: Glucose reference range applies only to samples taken after fasting for at least 8 hours.  Glucose, capillary     Status: Abnormal   Collection Time: 04/26/24  4:31 PM  Result Value Ref Range   Glucose-Capillary 272 (H) 70 - 99 mg/dL    Comment: Glucose reference range applies only to samples taken after fasting for at least 8 hours.  Glucose, capillary     Status: Abnormal   Collection Time: 04/26/24  8:56 PM  Result Value Ref Range   Glucose-Capillary 234 (H) 70 - 99 mg/dL    Comment: Glucose reference range applies only to samples taken after fasting for at least 8 hours.  CBC     Status: Abnormal   Collection Time: 04/27/24  3:51 AM  Result Value Ref Range   WBC 9.9 4.0 - 10.5 K/uL   RBC 3.35 (L) 4.22 - 5.81 MIL/uL   Hemoglobin 10.5 (L) 13.0 - 17.0 g/dL   HCT 60.4 (L) 54.0 - 98.1 %   MCV 94.6 80.0 - 100.0 fL   MCH 31.3 26.0 - 34.0 pg   MCHC 33.1 30.0 - 36.0 g/dL   RDW 19.1 47.8 - 29.5 %   Platelets 282 150 - 400  K/uL   nRBC 0.0 0.0 - 0.2 %    Comment: Performed at [facility], [address] [place], [place] [address]  Basic metabolic panel with GFR     Status: Abnormal   Collection Time: 04/27/24  3:51 AM  Result Value Ref Range   Sodium 132 (L) 135 - 145 mmol/L   Potassium 3.7 3.5 - 5.1 mmol/L   Chloride 98 98 - 111 mmol/L   CO2 22 22 - 32 mmol/L   Glucose, Bld 215 (H) 70 - 99 mg/dL    Comment: Glucose reference range applies only to samples taken after fasting for at least 8 hours.   BUN 86 (H) 8 - 23 mg/dL   Creatinine, Ser 8.65 (H) 0.61 - 1.24 mg/dL   Calcium  8.6 (L) 8.9 - 10.3 mg/dL   GFR, Estimated 19 (L) >60 mL/min    Comment: (NOTE) Calculated using the CKD-EPI Creatinine Equation (2021)    Anion gap 12 5 - 15    Comment: Performed at [facility], [address] [place], [place] [address]  Glucose, capillary     Status: Abnormal   Collection Time: 04/27/24  7:37 AM  Result Value Ref Range   Glucose-Capillary 211 (H) 70 - 99 mg/dL    Comment: Glucose reference range applies only to samples taken after fasting for at least 8 hours.  Glucose, capillary     Status: Abnormal   Collection Time: 04/27/24 11:15 AM  Result Value Ref Range   Glucose-Capillary 224 (H) 70 - 99 mg/dL    Comment: Glucose reference range applies only to samples taken after fasting for at least 8 hours.  Glucose, capillary     Status: Abnormal   Collection Time: 04/27/24  4:33 PM  Result Value Ref Range   Glucose-Capillary 173 (H) 70 - 99 mg/dL    Comment: Glucose reference range applies only to samples taken after fasting for at least 8  hours.  Glucose, capillary     Status: Abnormal   Collection Time: 04/27/24  9:55 PM  Result Value Ref Range   Glucose-Capillary 259 (H) 70 - 99 mg/dL    Comment: Glucose reference range applies only to samples taken after fasting for at least 8 hours.  CBC     Status: Abnormal   Collection Time: 04/28/24  3:41 AM  Result Value Ref Range   WBC  10.2 4.0 - 10.5 K/uL   RBC 3.23 (L) 4.22 - 5.81 MIL/uL   Hemoglobin 10.1 (L) 13.0 - 17.0 g/dL   HCT 19.1 (L) 47.8 - 29.5 %   MCV 94.7 80.0 - 100.0 fL   MCH 31.3 26.0 - 34.0 pg   MCHC 33.0 30.0 - 36.0 g/dL   RDW 62.1 30.8 - 65.7 %   Platelets 282 150 - 400 K/uL   nRBC 0.0 0.0 - 0.2 %    Comment: Performed at [facility], [address], [place], [place] [address]  Basic metabolic panel with GFR     Status: Abnormal   Collection Time: 04/28/24  3:41 AM  Result Value Ref Range   Sodium 134 (L) 135 - 145 mmol/L   Potassium 3.9 3.5 - 5.1 mmol/L   Chloride 102 98 - 111 mmol/L   CO2 21 (L) 22 - 32 mmol/L   Glucose, Bld 136 (H) 70 - 99 mg/dL    Comment: Glucose reference range applies only to samples taken after fasting for at least 8 hours.   BUN 83 (H) 8 - 23 mg/dL   Creatinine, Ser 2.95 (H) 0.61 - 1.24 mg/dL   Calcium  8.5 (L) 8.9 - 10.3 mg/dL   GFR, Estimated 21 (L) >60 mL/min    Comment: (NOTE) Calculated using the CKD-EPI Creatinine Equation (2021)    Anion gap 11 5 - 15    Comment: Performed at [facility], [address] [place], [place] [address]  Glucose, capillary     Status: Abnormal   Collection Time: 04/28/24  8:30 AM  Result Value Ref Range   Glucose-Capillary 189 (H) 70 - 99 mg/dL    Comment: Glucose reference range applies only to samples taken after fasting for at least 8 hours.    Radiology: No results found.  No results found.  DG Chest Port 1 View Result Date: 04/26/2024 CLINICAL DATA:  [id number] SOB (shortness of breath) [id number] EXAM: PORTABLE CHEST 1 VIEW COMPARISON:  Apr 23, 2024, December 16, 2019 FINDINGS: The cardiomediastinal silhouette is unchanged and enlarged in contour. No pleural effusion. No pneumothorax. No acute pleuroparenchymal abnormality. IMPRESSION: No acute cardiopulmonary abnormality. Electronically Signed   By: Clancy Crimes M.D.   On: 04/26/2024 16:59   ECHOCARDIOGRAM COMPLETE Result Date: 04/24/2024     ECHOCARDIOGRAM REPORT   Patient Name:   Zarion [name] Teasdale Date of Exam: 04/24/2024 Medical Rec #:  [id number]            Height:       66.0 in Accession #:    [id number]           Weight:       180.0 lb Date of Birth:  1941-02-05            BSA:          1.912 m Patient Age:    82 years             BP:           115/52 mmHg Patient  Gender: M                    HR:           64 bpm. Exam Location:  [facility] Procedure: 2D Echo, Cardiac Doppler and Color Doppler (Both Spectral and Color            Flow Doppler were utilized during procedure). Indications:     Aortic stenosis I35.0  History:         Patient has prior history of Echocardiogram examinations, most                  recent 04/18/2023. Risk Factors:Hypertension, Diabetes and                  Dyslipidemia.  Sonographer:     Broadus Canes Referring Phys:  [id number] CARALYN HUDSON Diagnosing Phys: Sabina Custovic IMPRESSIONS  1. Left ventricular ejection fraction, by estimation, is 60 to 65%. The left ventricle has normal function. The left ventricle has no regional wall motion abnormalities. Left ventricular diastolic parameters are consistent with Grade II diastolic dysfunction (pseudonormalization).  2. Right ventricular systolic function is normal. The right ventricular size is normal.  3. Left atrial size was mildly dilated.  4. The mitral valve is normal in structure. Mild mitral valve regurgitation. No evidence of mitral stenosis.  5. The aortic valve is calcified. Aortic valve regurgitation is not visualized. Mild to moderate aortic valve stenosis. Aortic valve area, by VTI measures 1.20 cm. Aortic valve mean gradient measures 25.3 mmHg. Aortic valve Vmax measures 3.16 m/s.  6. The inferior vena cava is normal in size with greater than 50% respiratory variability, suggesting right atrial pressure of 3 mmHg. FINDINGS  Left Ventricle: Left ventricular ejection fraction, by estimation, is 60 to 65%. The left ventricle has normal function. The left ventricle has no  regional wall motion abnormalities. The left ventricular internal cavity size was normal in size. There is  no left ventricular hypertrophy. Left ventricular diastolic parameters are consistent with Grade II diastolic dysfunction (pseudonormalization). Right Ventricle: The right ventricular size is normal. No increase in right ventricular wall thickness. Right ventricular systolic function is normal. Left Atrium: Left atrial size was mildly dilated. Right Atrium: Right atrial size was normal in size. Pericardium: There is no evidence of pericardial effusion. Mitral Valve: The mitral valve is normal in structure. Mild mitral valve regurgitation. No evidence of mitral valve stenosis. MV peak gradient, 7.5 mmHg. The mean mitral valve gradient is 3.0 mmHg. Tricuspid Valve: The tricuspid valve is normal in structure. Tricuspid valve regurgitation is trivial. Aortic Valve: The aortic valve is calcified. Aortic valve regurgitation is not visualized. Mild to moderate aortic stenosis is present. Aortic valve mean gradient measures 25.3 mmHg. Aortic valve peak gradient measures 39.9 mmHg. Aortic valve area, by VTI measures 1.20 cm. Pulmonic Valve: The pulmonic valve was normal in structure. Pulmonic valve regurgitation is not visualized. Aorta: The aortic root is normal in size and structure. Venous: The inferior vena cava is normal in size with greater than 50% respiratory variability, suggesting right atrial pressure of 3 mmHg. IAS/Shunts: No atrial level shunt detected by color flow Doppler.  LEFT VENTRICLE PLAX 2D LVIDd:         4.60 cm   Diastology LVIDs:         3.00 cm   LV e' medial:    4.79 cm/s LV PW:         1.00 cm   LV  E/e' medial:  16.6 LV IVS:        1.30 cm   LV e' lateral:   6.42 cm/s LVOT diam:     2.20 cm   LV E/e' lateral: 12.4 LV SV:         99 LV SV Index:   52 LVOT Area:     3.80 cm  RIGHT VENTRICLE RV Basal diam:  2.60 cm RV Mid diam:    2.70 cm RV S prime:     13.50 cm/s TAPSE (M-mode): 2.2 cm LEFT  ATRIUM             Index        RIGHT ATRIUM          Index LA diam:        4.10 cm 2.14 cm/m   RA Area:     9.12 cm LA Vol (A2C):   70.3 ml 36.76 ml/m  RA Volume:   16.80 ml 8.79 ml/m LA Vol (A4C):   55.6 ml 29.08 ml/m LA Biplane Vol: 62.8 ml 32.84 ml/m  AORTIC VALVE AV Area (Vmax):    1.06 cm AV Area (Vmean):   0.94 cm AV Area (VTI):     1.20 cm AV Vmax:           315.67 cm/s AV Vmean:          240.667 cm/s AV VTI:            0.822 m AV Peak Grad:      39.9 mmHg AV Mean Grad:      25.3 mmHg LVOT Vmax:         87.80 cm/s LVOT Vmean:        59.300 cm/s LVOT VTI:          0.260 m LVOT/AV VTI ratio: 0.32  AORTA Ao Root diam: 3.20 cm MITRAL VALVE                TRICUSPID VALVE MV Area (PHT): 3.06 cm     TR Peak grad:   20.2 mmHg MV Area VTI:   3.32 cm     TR Vmax:        225.00 cm/s MV Peak grad:  7.5 mmHg MV Mean grad:  3.0 mmHg     SHUNTS MV Vmax:       1.37 m/s     Systemic VTI:  0.26 m MV Vmean:      79.0 cm/s    Systemic Diam: 2.20 cm MV Decel Time: 248 msec MV E velocity: 79.70 cm/s MV A velocity: 127.00 cm/s MV E/A ratio:  0.63 Sabina Custovic Electronically signed by Isabell Manzanilla Signature Date/Time: 04/24/2024/11:59:28 AM    Final    DG Chest Port 1 View Result Date: 04/23/2024 CLINICAL DATA:  Chest pain EXAM: PORTABLE CHEST 1 VIEW COMPARISON:  11/19/2023 FINDINGS: The heart size and mediastinal contours are within normal limits. Both lungs are clear. The visualized skeletal structures are unremarkable. IMPRESSION: No active disease. Electronically Signed   By: Juanetta Nordmann M.D.   On: 04/23/2024 04:00      Assessment and Plan: Patient Active Problem List   Diagnosis Date Noted   Angina at rest [facility]) 04/23/2024   Atrial fibrillation, chronic (HCC) 10/30/2023   Obesity (BMI 30-39.9) 04/24/2023   Moderate aortic stenosis 04/24/2023   Paroxysmal atrial fibrillation with RVR (HCC) 04/24/2023   Bronchiectasis (HCC) 04/24/2023   Hemoptysis 04/24/2023   Acute renal failure superimposed  on stage 4 chronic  kidney disease (HCC) 04/18/2023   Leukocytosis 04/18/2023   Dysphagia 01/31/2023   Vitamin B12 deficiency 12/26/2022   Type II diabetes mellitus with renal manifestations (HCC) 08/15/2022   Depression 08/15/2022   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   Urinary tract infection without hematuria 11/01/2020   Dysuria 11/01/2020   Hyposmolality and/or hyponatremia 09/14/2020   Cough 09/13/2020   Bronchitis 07/15/2020   Palpitations 07/15/2020   Aortic atherosclerosis (HCC) 06/21/2020   (HFpEF) heart failure with preserved ejection fraction (HCC) 06/21/2020   Otalgia of both ears 05/27/2020   Lobar pneumonia (HCC)    Type 2 diabetes mellitus with stage 4 chronic kidney disease, without long-term current use of insulin  (HCC) 12/14/2019   Elevated troponin    Acidosis 10/30/2019   Benign 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 10/30/2019   Hyperkalemia 10/30/2019   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/30/2019   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   Encounter for fitting and adjustment of hearing aid 06/16/2019   Health maintenance alteration 06/16/2019   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/16/2019   DM type 2 with diabetic peripheral neuropathy (HCC) 01/25/2019   CKD (chronic kidney disease) stage 4, GFR 15-29 ml/min (HCC) 01/21/2019   Gout 01/21/2019   MR (mitral regurgitation) 01/21/2019   Chronic obstructive pulmonary disease (HCC) 01/08/2019   OSA (obstructive sleep apnea) 01/08/2019   Acute on chronic diastolic CHF (congestive heart failure) (HCC) 12/30/2018   Polyp of sigmoid colon    Benign neoplasm of ascending colon    Hemorrhoids without complication    Diverticulosis of large intestine without diverticulitis    Reflux esophagitis    Lymphangiectasia    Arteriovenous malformation of duodenum    Symptomatic anemia    SOB (shortness of breath)    Iron  deficiency anemia due to chronic blood loss     Unstable angina (HCC)    Anemia of chronic renal failure, stage 4 (severe) (HCC)    Chest pain 11/14/2018   Sciatica 11/12/2018   CAD (coronary artery disease) 02/27/2018   Sacroiliac joint pain 10/08/2017   Pain in limb 09/24/2017   Neuropathy 07/24/2017   NSTEMI (non-ST elevated myocardial infarction) (HCC) 05/06/2017   PAD (peripheral artery disease) (HCC) 05/01/2017   History of stroke 01/02/2017   Barrett's esophagus 10/02/2016   Carotid stenosis 10/02/2016   Essential hypertension 10/02/2016   Hyperlipidemia 10/02/2016   Diabetes (HCC) 10/02/2016   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 Chronic asthma, mild intermittent, uncomplicated (Primary) Continue inhalers as prescribed  2. OSA on CPAP Continue cpap nightly  3. CPAP use counseling CPAP couseling-Discussed importance of adequate CPAP use as well as proper care and cleaning techniques of machine and all supplies.  4. Moderate aortic stenosis Followed by cardiology   General Counseling: I have discussed the findings of the evaluation and examination with Amye Baller.  I have also discussed any further diagnostic evaluation thatmay be needed or ordered today. Garry verbalizes understanding of the findings of todays visit. We also reviewed his medications today and discussed drug interactions and side effects including but not limited excessive drowsiness and altered mental states. We also discussed that there is always a risk not just to him but also people around him. he has been encouraged to call the office with any questions or concerns that should arise related  to todays visit.  No orders of the defined types were placed in this encounter.    Time spent: 30  I have personally obtained a history, examined the patient, evaluated  laboratory and imaging results, formulated the assessment and plan and placed orders. This patient was seen by Taylor Favia, PA-C in collaboration with Dr. Cam Cava as a part of collaborative care agreement.     Cordie Deters, MD [facility] Pulmonary and Critical Care Sleep medicine

## 2024-04-23 ENCOUNTER — Other Ambulatory Visit: Payer: Self-pay

## 2024-04-23 ENCOUNTER — Emergency Department

## 2024-04-23 ENCOUNTER — Encounter: Payer: Self-pay | Admitting: Family Medicine

## 2024-04-23 ENCOUNTER — Inpatient Hospital Stay
Admission: EM | Admit: 2024-04-23 | Discharge: 2024-04-28 | DRG: 303 | Disposition: A | Discharge status: Home / Self Care | Attending: Internal Medicine | Admitting: Internal Medicine

## 2024-04-23 DIAGNOSIS — R042 Hemoptysis: Secondary | ICD-10-CM | POA: Diagnosis present

## 2024-04-23 DIAGNOSIS — M109 Gout, unspecified: Secondary | ICD-10-CM | POA: Diagnosis present

## 2024-04-23 DIAGNOSIS — Z823 Family history of stroke: Secondary | ICD-10-CM

## 2024-04-23 DIAGNOSIS — I25119 Atherosclerotic heart disease of native coronary artery with unspecified angina pectoris: Secondary | ICD-10-CM | POA: Diagnosis not present

## 2024-04-23 DIAGNOSIS — Z79899 Other long term (current) drug therapy: Secondary | ICD-10-CM | POA: Diagnosis not present

## 2024-04-23 DIAGNOSIS — Z886 Allergy status to analgesic agent status: Secondary | ICD-10-CM

## 2024-04-23 DIAGNOSIS — Z1152 Encounter for screening for COVID-19: Secondary | ICD-10-CM

## 2024-04-23 DIAGNOSIS — Z888 Allergy status to other drugs, medicaments and biological substances status: Secondary | ICD-10-CM

## 2024-04-23 DIAGNOSIS — E1122 Type 2 diabetes mellitus with diabetic chronic kidney disease: Secondary | ICD-10-CM | POA: Diagnosis present

## 2024-04-23 DIAGNOSIS — R609 Edema, unspecified: Secondary | ICD-10-CM | POA: Diagnosis not present

## 2024-04-23 DIAGNOSIS — Z85828 Personal history of other malignant neoplasm of skin: Secondary | ICD-10-CM | POA: Diagnosis not present

## 2024-04-23 DIAGNOSIS — N184 Chronic kidney disease, stage 4 (severe): Secondary | ICD-10-CM | POA: Diagnosis not present

## 2024-04-23 DIAGNOSIS — I350 Nonrheumatic aortic (valve) stenosis: Secondary | ICD-10-CM | POA: Diagnosis present

## 2024-04-23 DIAGNOSIS — E1165 Type 2 diabetes mellitus with hyperglycemia: Secondary | ICD-10-CM | POA: Diagnosis present

## 2024-04-23 DIAGNOSIS — Z91030 Bee allergy status: Secondary | ICD-10-CM

## 2024-04-23 DIAGNOSIS — I259 Chronic ischemic heart disease, unspecified: Secondary | ICD-10-CM | POA: Diagnosis not present

## 2024-04-23 DIAGNOSIS — J449 Chronic obstructive pulmonary disease, unspecified: Secondary | ICD-10-CM | POA: Diagnosis present

## 2024-04-23 DIAGNOSIS — E1151 Type 2 diabetes mellitus with diabetic peripheral angiopathy without gangrene: Secondary | ICD-10-CM | POA: Diagnosis not present

## 2024-04-23 DIAGNOSIS — Z91010 Allergy to peanuts: Secondary | ICD-10-CM

## 2024-04-23 DIAGNOSIS — I200 Unstable angina: Secondary | ICD-10-CM | POA: Diagnosis not present

## 2024-04-23 DIAGNOSIS — Z7984 Long term (current) use of oral hypoglycemic drugs: Secondary | ICD-10-CM

## 2024-04-23 DIAGNOSIS — N179 Acute kidney failure, unspecified: Secondary | ICD-10-CM | POA: Diagnosis not present

## 2024-04-23 DIAGNOSIS — Z85820 Personal history of malignant melanoma of skin: Secondary | ICD-10-CM

## 2024-04-23 DIAGNOSIS — N2581 Secondary hyperparathyroidism of renal origin: Secondary | ICD-10-CM | POA: Diagnosis present

## 2024-04-23 DIAGNOSIS — Z860100 Personal history of colon polyps, unspecified: Secondary | ICD-10-CM

## 2024-04-23 DIAGNOSIS — I129 Hypertensive chronic kidney disease with stage 1 through stage 4 chronic kidney disease, or unspecified chronic kidney disease: Secondary | ICD-10-CM | POA: Diagnosis not present

## 2024-04-23 DIAGNOSIS — I5030 Unspecified diastolic (congestive) heart failure: Secondary | ICD-10-CM | POA: Diagnosis not present

## 2024-04-23 DIAGNOSIS — Z7902 Long term (current) use of antithrombotics/antiplatelets: Secondary | ICD-10-CM

## 2024-04-23 DIAGNOSIS — Z880 Allergy status to penicillin: Secondary | ICD-10-CM

## 2024-04-23 DIAGNOSIS — R7989 Other specified abnormal findings of blood chemistry: Secondary | ICD-10-CM

## 2024-04-23 DIAGNOSIS — M79603 Pain in arm, unspecified: Secondary | ICD-10-CM | POA: Diagnosis not present

## 2024-04-23 DIAGNOSIS — I491 Atrial premature depolarization: Secondary | ICD-10-CM | POA: Diagnosis not present

## 2024-04-23 DIAGNOSIS — G8929 Other chronic pain: Secondary | ICD-10-CM | POA: Diagnosis not present

## 2024-04-23 DIAGNOSIS — Z87891 Personal history of nicotine dependence: Secondary | ICD-10-CM

## 2024-04-23 DIAGNOSIS — I499 Cardiac arrhythmia, unspecified: Secondary | ICD-10-CM | POA: Diagnosis not present

## 2024-04-23 DIAGNOSIS — I472 Ventricular tachycardia, unspecified: Secondary | ICD-10-CM | POA: Diagnosis not present

## 2024-04-23 DIAGNOSIS — D631 Anemia in chronic kidney disease: Secondary | ICD-10-CM | POA: Diagnosis not present

## 2024-04-23 DIAGNOSIS — I2089 Other forms of angina pectoris: Secondary | ICD-10-CM | POA: Diagnosis present

## 2024-04-23 DIAGNOSIS — Z794 Long term (current) use of insulin: Secondary | ICD-10-CM

## 2024-04-23 DIAGNOSIS — Z887 Allergy status to serum and vaccine status: Secondary | ICD-10-CM

## 2024-04-23 DIAGNOSIS — N189 Chronic kidney disease, unspecified: Secondary | ICD-10-CM | POA: Diagnosis not present

## 2024-04-23 DIAGNOSIS — I480 Paroxysmal atrial fibrillation: Secondary | ICD-10-CM | POA: Diagnosis not present

## 2024-04-23 DIAGNOSIS — Z8546 Personal history of malignant neoplasm of prostate: Secondary | ICD-10-CM

## 2024-04-23 DIAGNOSIS — Z9861 Coronary angioplasty status: Secondary | ICD-10-CM | POA: Diagnosis not present

## 2024-04-23 DIAGNOSIS — A419 Sepsis, unspecified organism: Secondary | ICD-10-CM | POA: Diagnosis not present

## 2024-04-23 DIAGNOSIS — I130 Hypertensive heart and chronic kidney disease with heart failure and stage 1 through stage 4 chronic kidney disease, or unspecified chronic kidney disease: Secondary | ICD-10-CM | POA: Diagnosis present

## 2024-04-23 DIAGNOSIS — K2270 Barrett's esophagus without dysplasia: Secondary | ICD-10-CM | POA: Diagnosis present

## 2024-04-23 DIAGNOSIS — R079 Chest pain, unspecified: Secondary | ICD-10-CM | POA: Diagnosis present

## 2024-04-23 DIAGNOSIS — I10 Essential (primary) hypertension: Secondary | ICD-10-CM | POA: Diagnosis not present

## 2024-04-23 DIAGNOSIS — I252 Old myocardial infarction: Secondary | ICD-10-CM

## 2024-04-23 DIAGNOSIS — Z8261 Family history of arthritis: Secondary | ICD-10-CM

## 2024-04-23 DIAGNOSIS — I5032 Chronic diastolic (congestive) heart failure: Secondary | ICD-10-CM | POA: Diagnosis not present

## 2024-04-23 DIAGNOSIS — E785 Hyperlipidemia, unspecified: Secondary | ICD-10-CM | POA: Diagnosis not present

## 2024-04-23 DIAGNOSIS — Z7951 Long term (current) use of inhaled steroids: Secondary | ICD-10-CM

## 2024-04-23 DIAGNOSIS — I209 Angina pectoris, unspecified: Secondary | ICD-10-CM | POA: Diagnosis not present

## 2024-04-23 DIAGNOSIS — Z881 Allergy status to other antibiotic agents status: Secondary | ICD-10-CM

## 2024-04-23 DIAGNOSIS — Z955 Presence of coronary angioplasty implant and graft: Secondary | ICD-10-CM

## 2024-04-23 DIAGNOSIS — R0602 Shortness of breath: Secondary | ICD-10-CM | POA: Diagnosis not present

## 2024-04-23 LAB — URINALYSIS, W/ REFLEX TO CULTURE (INFECTION SUSPECTED)
Bilirubin Urine: NEGATIVE
Glucose, UA: >=500 mg/dL — AB
Hgb urine dipstick: NEGATIVE
Ketones, ur: NEGATIVE mg/dL
Leukocytes,Ua: NEGATIVE
Nitrite: NEGATIVE
Protein, ur: NEGATIVE mg/dL
RBC / HPF: 0 RBC/hpf (ref 0–5)
Specific Gravity, Urine: 1.01 (ref 1.005–1.030)
Squamous Epithelial / HPF: 0 /HPF (ref 0–5)
pH: 6 (ref 5.0–8.0)

## 2024-04-23 LAB — CBC WITH DIFFERENTIAL/PLATELET
Abs Immature Granulocytes: 0.08 10*3/uL — ABNORMAL HIGH (ref 0.00–0.07)
Basophils Absolute: 0.1 10*3/uL (ref 0.0–0.1)
Basophils Relative: 1 %
Eosinophils Absolute: 0.5 10*3/uL (ref 0.0–0.5)
Eosinophils Relative: 6 %
HCT: 36 % — ABNORMAL LOW (ref 39.0–52.0)
Hemoglobin: 12.1 g/dL — ABNORMAL LOW (ref 13.0–17.0)
Immature Granulocytes: 1 %
Lymphocytes Relative: 13 %
Lymphs Abs: 1.2 10*3/uL (ref 0.7–4.0)
MCH: 31.8 pg (ref 26.0–34.0)
MCHC: 33.6 g/dL (ref 30.0–36.0)
MCV: 94.7 fL (ref 80.0–100.0)
Monocytes Absolute: 0.7 10*3/uL (ref 0.1–1.0)
Monocytes Relative: 8 %
Neutro Abs: 6.5 10*3/uL (ref 1.7–7.7)
Neutrophils Relative %: 71 %
Platelets: 274 10*3/uL (ref 150–400)
RBC: 3.8 MIL/uL — ABNORMAL LOW (ref 4.22–5.81)
RDW: 13.3 % (ref 11.5–15.5)
WBC: 9.1 10*3/uL (ref 4.0–10.5)
nRBC: 0 % (ref 0.0–0.2)

## 2024-04-23 LAB — COMPREHENSIVE METABOLIC PANEL WITH GFR
ALT: 16 U/L (ref 0–44)
AST: 19 U/L (ref 15–41)
Albumin: 3.6 g/dL (ref 3.5–5.0)
Alkaline Phosphatase: 51 U/L (ref 38–126)
Anion gap: 12 (ref 5–15)
BUN: 61 mg/dL — ABNORMAL HIGH (ref 8–23)
CO2: 23 mmol/L (ref 22–32)
Calcium: 8.6 mg/dL — ABNORMAL LOW (ref 8.9–10.3)
Chloride: 99 mmol/L (ref 98–111)
Creatinine, Ser: 2.92 mg/dL — ABNORMAL HIGH (ref 0.61–1.24)
GFR, Estimated: 21 mL/min — ABNORMAL LOW (ref >60)
Glucose, Bld: 250 mg/dL — ABNORMAL HIGH (ref 70–99)
Potassium: 4.2 mmol/L (ref 3.5–5.1)
Sodium: 134 mmol/L — ABNORMAL LOW (ref 135–145)
Total Bilirubin: 0.7 mg/dL (ref 0.0–1.2)
Total Protein: 6.8 g/dL (ref 6.5–8.1)

## 2024-04-23 LAB — LIPID PANEL
Cholesterol: 141 mg/dL (ref 0–200)
HDL: 24 mg/dL — ABNORMAL LOW (ref >40)
LDL Cholesterol: 43 mg/dL (ref 0–99)
Total CHOL/HDL Ratio: 5.9 ratio
Triglycerides: 369 mg/dL — ABNORMAL HIGH (ref <150)
VLDL: 74 mg/dL — ABNORMAL HIGH (ref 0–40)

## 2024-04-23 LAB — RESP PANEL BY RT-PCR (RSV, FLU A&B, COVID)  RVPGX2
Influenza A by PCR: NEGATIVE
Influenza B by PCR: NEGATIVE
Resp Syncytial Virus by PCR: NEGATIVE
SARS Coronavirus 2 by RT PCR: NEGATIVE

## 2024-04-23 LAB — LIPASE, BLOOD: Lipase: 39 U/L (ref 11–51)

## 2024-04-23 LAB — CBG MONITORING, ED: Glucose-Capillary: 104 mg/dL — ABNORMAL HIGH (ref 70–99)

## 2024-04-23 LAB — TROPONIN I (HIGH SENSITIVITY)
Troponin I (High Sensitivity): 24 ng/L — ABNORMAL HIGH (ref <18)
Troponin I (High Sensitivity): 37 ng/L — ABNORMAL HIGH (ref <18)

## 2024-04-23 LAB — GLUCOSE, CAPILLARY
Glucose-Capillary: 178 mg/dL — ABNORMAL HIGH (ref 70–99)
Glucose-Capillary: 178 mg/dL — ABNORMAL HIGH (ref 70–99)

## 2024-04-23 LAB — LACTIC ACID, PLASMA
Lactic Acid, Venous: 2 mmol/L (ref 0.5–1.9)
Lactic Acid, Venous: 1.5 mmol/L (ref 0.5–1.9)

## 2024-04-23 MED ORDER — INSULIN ASPART 100 UNIT/ML IJ SOLN
0.0000 [IU] | Freq: Every day | INTRAMUSCULAR | Status: DC
  Administered 2024-04-24: 2 [IU] via SUBCUTANEOUS
  Administered 2024-04-25: 3 [IU] via SUBCUTANEOUS
  Administered 2024-04-26: 2 [IU] via SUBCUTANEOUS
  Administered 2024-04-27: 3 [IU] via SUBCUTANEOUS
  Filled 2024-04-23 (×4): qty 1

## 2024-04-23 MED ORDER — AZELASTINE HCL 0.1 % NA SOLN
1.0000 | Freq: Two times a day (BID) | NASAL | Status: DC
  Administered 2024-04-23 – 2024-04-27 (×9): 1 via NASAL
  Filled 2024-04-23: qty 30

## 2024-04-23 MED ORDER — ISOSORBIDE MONONITRATE ER 60 MG PO TB24
120.0000 mg | ORAL_TABLET | Freq: Two times a day (BID) | ORAL | Status: DC
  Administered 2024-04-23 – 2024-04-28 (×11): 120 mg via ORAL
  Filled 2024-04-23 (×11): qty 2

## 2024-04-23 MED ORDER — ONDANSETRON HCL 4 MG/2ML IJ SOLN: 4.0000 mg | Freq: Four times a day (QID) | INTRAMUSCULAR | Status: DC | PRN

## 2024-04-23 MED ORDER — METOPROLOL SUCCINATE ER 50 MG PO TB24
75.0000 mg | ORAL_TABLET | Freq: Every day | ORAL | Status: DC
  Administered 2024-04-24: 75 mg via ORAL
  Filled 2024-04-23: qty 1

## 2024-04-23 MED ORDER — BUDESONIDE 0.25 MG/2ML IN SUSP
0.2500 mg | Freq: Two times a day (BID) | RESPIRATORY_TRACT | Status: DC
Start: 2024-04-23 — End: 2024-04-28
  Administered 2024-04-23 – 2024-04-28 (×10): 0.25 mg via RESPIRATORY_TRACT
  Filled 2024-04-23 (×10): qty 2

## 2024-04-23 MED ORDER — NITROGLYCERIN 0.4 MG SL SUBL
0.4000 mg | SUBLINGUAL_TABLET | SUBLINGUAL | Status: DC | PRN
Start: 2024-04-23 — End: 2024-04-28
  Administered 2024-04-25 – 2024-04-27 (×3): 0.4 mg via SUBLINGUAL
  Filled 2024-04-23 (×2): qty 1

## 2024-04-23 MED ORDER — VANCOMYCIN HCL IN DEXTROSE 1-5 GM/200ML-% IV SOLN: 1000.0000 mg | Freq: Once | INTRAVENOUS | Status: DC

## 2024-04-23 MED ORDER — METRONIDAZOLE 500 MG/100ML IV SOLN
500.0000 mg | Freq: Once | INTRAVENOUS | Status: AC
  Administered 2024-04-23: 500 mg via INTRAVENOUS
  Filled 2024-04-23: qty 100

## 2024-04-23 MED ORDER — PANTOPRAZOLE SODIUM 40 MG PO TBEC
40.0000 mg | DELAYED_RELEASE_TABLET | Freq: Two times a day (BID) | ORAL | Status: DC
  Administered 2024-04-23 – 2024-04-28 (×11): 40 mg via ORAL
  Filled 2024-04-23 (×11): qty 1

## 2024-04-23 MED ORDER — ARFORMOTEROL TARTRATE 15 MCG/2ML IN NEBU
15.0000 ug | INHALATION_SOLUTION | Freq: Two times a day (BID) | RESPIRATORY_TRACT | Status: DC
  Administered 2024-04-23 – 2024-04-28 (×10): 15 ug via RESPIRATORY_TRACT
  Filled 2024-04-23 (×11): qty 2

## 2024-04-23 MED ORDER — LORATADINE 10 MG PO TABS
10.0000 mg | ORAL_TABLET | Freq: Every day | ORAL | Status: DC
  Administered 2024-04-23 – 2024-04-27 (×5): 10 mg via ORAL
  Filled 2024-04-23 (×6): qty 1

## 2024-04-23 MED ORDER — CLOPIDOGREL BISULFATE 75 MG PO TABS
75.0000 mg | ORAL_TABLET | Freq: Every day | ORAL | Status: DC
  Administered 2024-04-23 – 2024-04-28 (×6): 75 mg via ORAL
  Filled 2024-04-23 (×6): qty 1

## 2024-04-23 MED ORDER — SODIUM CHLORIDE 0.9 % IV SOLN
2.0000 g | Freq: Once | INTRAVENOUS | Status: AC
  Administered 2024-04-23: 2 g via INTRAVENOUS
  Filled 2024-04-23: qty 10

## 2024-04-23 MED ORDER — SODIUM FLUORIDE 1.1 % DT CREA
TOPICAL_CREAM | DENTAL | Status: DC
Start: 2024-04-23 — End: 2024-04-23

## 2024-04-23 MED ORDER — ONDANSETRON HCL 4 MG/2ML IJ SOLN
4.0000 mg | Freq: Once | INTRAMUSCULAR | Status: AC
  Administered 2024-04-23: 4 mg via INTRAVENOUS
  Filled 2024-04-23: qty 2

## 2024-04-23 MED ORDER — LACTATED RINGERS IV BOLUS (SEPSIS): 500.0000 mL | Freq: Once | INTRAVENOUS | Status: DC

## 2024-04-23 MED ORDER — MOMETASONE FUROATE 220 MCG/ACT IN AEPB: 2.0000 | INHALATION_SPRAY | Freq: Every day | RESPIRATORY_TRACT | Status: DC

## 2024-04-23 MED ORDER — UMECLIDINIUM BROMIDE 62.5 MCG/ACT IN AEPB
1.0000 | INHALATION_SPRAY | Freq: Every day | RESPIRATORY_TRACT | Status: DC
  Administered 2024-04-23 – 2024-04-27 (×5): 1 via RESPIRATORY_TRACT
  Filled 2024-04-23: qty 7

## 2024-04-23 MED ORDER — LACTATED RINGERS IV BOLUS (SEPSIS)
1000.0000 mL | Freq: Once | INTRAVENOUS | Status: AC
  Administered 2024-04-23: 1 mL via INTRAVENOUS

## 2024-04-23 MED ORDER — INSULIN ASPART 100 UNIT/ML IJ SOLN: 8.0000 [IU] | Freq: Three times a day (TID) | INTRAMUSCULAR | Status: DC

## 2024-04-23 MED ORDER — METOPROLOL SUCCINATE ER 50 MG PO TB24: 50.0000 mg | ORAL_TABLET | Freq: Every day | ORAL | Status: DC

## 2024-04-23 MED ORDER — NITROGLYCERIN 2 % TD OINT
1.0000 [in_us] | TOPICAL_OINTMENT | Freq: Once | TRANSDERMAL | Status: AC
  Administered 2024-04-23: 1 [in_us] via TOPICAL
  Filled 2024-04-23: qty 1

## 2024-04-23 MED ORDER — CITALOPRAM HYDROBROMIDE 20 MG PO TABS
10.0000 mg | ORAL_TABLET | Freq: Every day | ORAL | Status: DC
  Administered 2024-04-23 – 2024-04-28 (×6): 10 mg via ORAL
  Filled 2024-04-23 (×6): qty 1

## 2024-04-23 MED ORDER — RANOLAZINE ER 500 MG PO TB12
500.0000 mg | ORAL_TABLET | Freq: Two times a day (BID) | ORAL | Status: DC
  Administered 2024-04-23 – 2024-04-28 (×11): 500 mg via ORAL
  Filled 2024-04-23 (×11): qty 1

## 2024-04-23 MED ORDER — INSULIN ASPART 100 UNIT/ML IJ SOLN
0.0000 [IU] | Freq: Three times a day (TID) | INTRAMUSCULAR | Status: DC
  Administered 2024-04-23: 2 [IU] via SUBCUTANEOUS
  Administered 2024-04-24: 3 [IU] via SUBCUTANEOUS
  Administered 2024-04-24: 2 [IU] via SUBCUTANEOUS
  Administered 2024-04-24: 1 [IU] via SUBCUTANEOUS
  Administered 2024-04-25: 5 [IU] via SUBCUTANEOUS
  Administered 2024-04-25 (×2): 2 [IU] via SUBCUTANEOUS
  Administered 2024-04-26: 5 [IU] via SUBCUTANEOUS
  Administered 2024-04-26 (×2): 3 [IU] via SUBCUTANEOUS
  Administered 2024-04-27: 2 [IU] via SUBCUTANEOUS
  Administered 2024-04-27 (×2): 3 [IU] via SUBCUTANEOUS
  Administered 2024-04-28: 2 [IU] via SUBCUTANEOUS
  Filled 2024-04-23 (×14): qty 1

## 2024-04-23 MED ORDER — VANCOMYCIN HCL 2000 MG/400ML IV SOLN
2000.0000 mg | Freq: Once | INTRAVENOUS | Status: AC
  Administered 2024-04-23: 2000 mg via INTRAVENOUS
  Filled 2024-04-23: qty 400

## 2024-04-23 MED ORDER — MONTELUKAST SODIUM 10 MG PO TABS
10.0000 mg | ORAL_TABLET | Freq: Every day | ORAL | Status: DC
  Administered 2024-04-23 – 2024-04-27 (×5): 10 mg via ORAL
  Filled 2024-04-23 (×5): qty 1

## 2024-04-23 MED ORDER — LACTATED RINGERS IV BOLUS (SEPSIS): 1000.0000 mL | Freq: Once | INTRAVENOUS | Status: DC

## 2024-04-23 MED ORDER — HEPARIN SODIUM (PORCINE) 5000 UNIT/ML IJ SOLN
5000.0000 [IU] | Freq: Two times a day (BID) | INTRAMUSCULAR | Status: DC
  Administered 2024-04-23 – 2024-04-27 (×10): 5000 [IU] via SUBCUTANEOUS
  Filled 2024-04-23 (×11): qty 1

## 2024-04-23 MED ORDER — ORAL CARE MOUTH RINSE: 15.0000 mL | OROMUCOSAL | Status: DC | PRN

## 2024-04-23 MED ORDER — ACETAMINOPHEN 500 MG PO TABS
1000.0000 mg | ORAL_TABLET | Freq: Three times a day (TID) | ORAL | Status: DC | PRN
  Administered 2024-04-23 – 2024-04-26 (×2): 1000 mg via ORAL
  Filled 2024-04-23 (×2): qty 2

## 2024-04-23 MED ORDER — MORPHINE SULFATE (PF) 2 MG/ML IV SOLN
2.0000 mg | Freq: Once | INTRAVENOUS | Status: AC
  Administered 2024-04-23: 2 mg via INTRAVENOUS
  Filled 2024-04-23: qty 1

## 2024-04-23 MED ORDER — TORSEMIDE 20 MG PO TABS
40.0000 mg | ORAL_TABLET | Freq: Two times a day (BID) | ORAL | Status: DC
  Administered 2024-04-23 – 2024-04-25 (×4): 40 mg via ORAL
  Filled 2024-04-23 (×4): qty 2

## 2024-04-23 MED ORDER — INSULIN GLARGINE-YFGN 100 UNIT/ML ~~LOC~~ SOLN
20.0000 [IU] | Freq: Every day | SUBCUTANEOUS | Status: DC
  Filled 2024-04-23: qty 0.2

## 2024-04-23 MED ORDER — NITROGLYCERIN 2 % TD OINT
0.5000 [in_us] | TOPICAL_OINTMENT | Freq: Four times a day (QID) | TRANSDERMAL | Status: DC | PRN
  Administered 2024-04-24 – 2024-04-27 (×3): 0.5 [in_us] via TOPICAL
  Filled 2024-04-23 (×3): qty 1

## 2024-04-23 MED ORDER — FLUTICASONE PROPIONATE 50 MCG/ACT NA SUSP: 1.0000 | Freq: Every day | NASAL | Status: DC | PRN

## 2024-04-23 MED ORDER — ATORVASTATIN CALCIUM 80 MG PO TABS
80.0000 mg | ORAL_TABLET | Freq: Every day | ORAL | Status: DC
  Administered 2024-04-23 – 2024-04-28 (×6): 80 mg via ORAL
  Filled 2024-04-23 (×3): qty 1
  Filled 2024-04-23: qty 4
  Filled 2024-04-23 (×2): qty 1

## 2024-04-23 MED ORDER — HYDROMORPHONE HCL 1 MG/ML IJ SOLN
0.5000 mg | INTRAMUSCULAR | Status: DC | PRN
  Administered 2024-04-26 (×2): 0.5 mg via INTRAVENOUS
  Filled 2024-04-23 (×2): qty 0.5

## 2024-04-23 MED ORDER — TRAZODONE HCL 50 MG PO TABS
25.0000 mg | ORAL_TABLET | Freq: Every day | ORAL | Status: DC
  Administered 2024-04-23 – 2024-04-27 (×5): 25 mg via ORAL
  Filled 2024-04-23 (×5): qty 1

## 2024-04-23 NOTE — ED Triage Notes (Signed)
 Pt arrived via EMS from home. Pt complains of chest pain and heaviness "feels like someone is sitting on my chest." Pt is alert and oriented. Pt does have a hx of stents and heart failure. Pt took Aspirin  and 3 nitroglycerin  before he came in.

## 2024-04-23 NOTE — H&P (Signed)
 History and Physical    Mike Decker LKG:[id number] DOB: [birth year] DOA: 04/23/2024  PCP: Laurence Pons, NP (Confirm with patient/family/NH records and if not entered, this has to be entered at [facility] point of entry) Patient coming from: Home  I have personally briefly reviewed patient's old medical records in [facility]  Chief Complaint: Chest pain  HPI: Mike Decker is a 83 y.o. male with medical history significant of CAD status post PCI with RCA LCx stenting, moderate aortic stenosis, chronic HFpEF, CKD stage IV, IDDM, carotid stenosis, COPD, OSA on CPAP, Barret esophagus, history of prostate cancer presented with recurrent chest pains.  Patient reported he first had chest pain back in November 2024 and was hospitalized, workup was benign and his medication adjusted and patient discharged.  He remained chest pain-free for last 4 months until last week.  Had first episode of chest pain last week while he was walking in his yard.  He took 1 nitroglycerin  and the chest pain went away.  Denied any associated symptoms.  He does report lately he has been getting frequent episode of lightheadedness and 1 episode of exertional dyspnea when he was pushing a empty trash can.  Last night he woke up around 2 AM with 10/10 pressure-like chest pain associated with palpitations, he took 3 nitroglycerin  and short successions but chest pain persisted.  He went to see [facility] surgery in January, underwent a PET scan which showed heavy calcified AAS and he was told to repeat echocardiogram in the next visit scheduled for June.  ED Course: Temperature 99.1 blood pressure 160/55, O2 saturation 98% on room air.  X-ray showed no acute infiltrates.  Blood work showed hemoglobin 12.1, BUN 61 creatinine 2.9 compared to baseline 3.0-3.2, glucose 250.  Patient was given morphine  and Zofran  and chest pain is 6/10 now.  Review of Systems: As per HPI otherwise 14 point review of systems negative.     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, esphageal   Chronic diastolic CHF (congestive heart failure) (HCC)    Colon polyp    Diabetes mellitus without complication (HCC)    Diverticulosis    Gout    Heart disease    Hemangioma    liver   Hyperlipidemia    Hypertension    Myocardial infarct [facility])    Ocular hypertension    Peripheral vascular disease (HCC)    Skin cancer    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HCC)    Sleep apnea    Vitreoretinal degeneration     Past Surgical History:  Procedure Laterality Date   CATARACT EXTRACTION  2011, 2012   COLONOSCOPY N/A 12/14/2018   Procedure: COLONOSCOPY;  Surgeon: Irby Mannan, MD;  Location: [facility] ENDOSCOPY;  Service: Endoscopy;  Laterality: N/A;   CORONARY ANGIOPLASTY WITH STENT PLACEMENT  12/10/2010   CORONARY STENT INTERVENTION N/A 12/29/2019   Procedure: CORONARY STENT INTERVENTION;  Surgeon: Sammy Crisp, MD;  Location: [facility] INVASIVE CV LAB;  Service: Cardiovascular;  Laterality: N/A;   ESOPHAGOGASTRODUODENOSCOPY N/A 12/14/2018   Procedure: ESOPHAGOGASTRODUODENOSCOPY (EGD);  Surgeon: Irby Mannan, MD;  Location: [facility] ENDOSCOPY;  Service: Endoscopy;  Laterality: N/A;   ESOPHAGOGASTRODUODENOSCOPY (EGD) WITH PROPOFOL  N/A 06/01/2016   Procedure: ESOPHAGOGASTRODUODENOSCOPY (EGD) WITH PROPOFOL ;  Surgeon: Cassie Click, MD;  Location: [facility] ENDOSCOPY;  Service:  Endoscopy;  Laterality: N/A;   ESOPHAGOGASTRODUODENOSCOPY (EGD) WITH PROPOFOL  N/A 01/31/2023   Procedure: ESOPHAGOGASTRODUODENOSCOPY (EGD) WITH PROPOFOL ;  Surgeon: Marnee Sink, MD;  Location: [facility] ENDOSCOPY;  Service: Endoscopy;  Laterality: N/A;   FEMORAL ARTERY STENT Right    HERNIA REPAIR     x2   INTRAOCULAR LENS INSERTION     LEFT HEART CATH AND CORONARY ANGIOGRAPHY N/A 05/09/2017   Procedure: Left Heart Cath and  Coronary Angiography;  Surgeon: Antonette Batters, MD;  Location: [facility] INVASIVE CV LAB;  Service: Cardiovascular;  Laterality: N/A;   LEFT HEART CATH AND CORONARY ANGIOGRAPHY N/A 12/08/2018   Procedure: LEFT HEART CATH AND CORONARY ANGIOGRAPHY and possible PCI and stent;  Surgeon: Antonette Batters, MD;  Location: [facility] INVASIVE CV LAB;  Service: Cardiovascular;  Laterality: N/A;   LEFT HEART CATH AND CORONARY ANGIOGRAPHY N/A 12/29/2019   Procedure: LEFT HEART CATH AND CORONARY ANGIOGRAPHY;  Surgeon: Sammy Crisp, MD;  Location: [facility] INVASIVE CV LAB;  Service: Cardiovascular;  Laterality: N/A;   LEFT HEART CATH AND CORONARY ANGIOGRAPHY Left 12/13/2022   Procedure: LEFT HEART CATH AND CORONARY ANGIOGRAPHY;  Surgeon: Antonette Batters, MD;  Location: [facility] INVASIVE CV LAB;  Service: Cardiovascular;  Laterality: Left;   NOSE SURGERY     submucous resection   PROSTATE SURGERY  12/10/2000   ROTATOR CUFF REPAIR Right      reports that he quit smoking about 47 years ago. His smoking use included cigarettes. He started smoking about 67 years ago. He has never used smokeless tobacco. He reports that he does not drink alcohol and does not use drugs.  Allergies  Allergen Reactions   Gabapentin      Other reaction(s): Other (See Comments) Tremors   Peanut-Containing Drug Products Anaphylaxis   Penicillins Hives and Rash    Has patient had a PCN reaction causing immediate rash, facial/tongue/throat swelling, SOB or lightheadedness with hypotension: Yes Has patient had a PCN reaction causing severe rash involving mucus membranes or skin necrosis: No Has patient had a PCN reaction that required hospitalization No Has patient had a PCN reaction occurring within the last 10 years: No If all of the above answers are "NO", then may proceed with Cephalosporin use.   Bee Venom Swelling   Influenza Vaccines Hives   Inh [Isoniazid ] Hives   Jardiance [Empagliflozin] Other (See Comments)    Diarrhea    Kenalog [Triamcinolone Acetonide] Hives   Levaquin  [Levofloxacin ] Other (See Comments)    Tendon, ligament pain.    Lisinopril      Other reaction(s): Hyperkalemia   Nalfon [Fenoprofen Calcium ] Hives   Naproxen    Peanut Oil    Nsaids Rash    Nalfon 600 Nalfon 600    Family History  Problem Relation Age of Onset   Arthritis Mother    Stroke Maternal Grandfather    Breast cancer Neg Hx      Prior to Admission medications   Medication Sig Start Date End Date Taking? Authorizing Provider  acetaminophen  (TYLENOL ) 500 MG tablet Take 1,000 mg by mouth every 8 (eight) hours as needed for mild pain.    [provider]  atorvastatin  (LIPITOR ) 80 MG tablet Take 1 tablet (80 mg total) by mouth daily. 04/25/23   Donaciano Frizzle, MD  azelastine  (ASTELIN ) 0.1 % nasal spray USE 1 TO 2 SPRAYS IN EACH NOSTRIL TWICE A DAY 06/01/20   [provider]  calcitRIOL  (ROCALTROL ) 0.25 MCG capsule Take 0.25 mcg by mouth daily.    [provider]  cetirizine (ZYRTEC) 5 MG tablet Take 5 mg by mouth daily.    [provider]  citalopram  (CELEXA ) 10 MG tablet Take 1 tablet (10 mg total) by mouth daily. 03/12/22   Theron Flavin, MD  clopidogrel  (PLAVIX ) 75 MG tablet TAKE 1 TABLET BY MOUTH EVERY DAY 07/02/22   Masoud, Javed, MD  [employer] VITAMIN B12 1000 MCG tablet TAKE 1 TABLET BY MOUTH EVERY DAY 11/21/21   Timmy Forbes, MD  EPINEPHrine  (EPI-PEN) 0.3 mg/0.3 mL SOAJ injection Inject into the muscle as needed (anaphylaxis).    [provider]  ergocalciferol  (VITAMIN D2) 1.25 MG (50000 UT) capsule Take 50,000 Units by mouth every 30 (thirty) days.    [provider]  ferrous sulfate  325 (65 FE) MG EC tablet TAKE 1 TABLET BY MOUTH EVERY DAY 02/06/21   Timmy Forbes, MD  fluticasone  (FLONASE ) 50 MCG/ACT nasal spray USE 1 SPRAY IN EACH NOSTRIL EVERY DAY 09/12/20   Theron Flavin, MD  folic acid  (FOLVITE ) 400 MCG tablet Take 400 mcg by mouth daily. 10/14/20   [provider]   insulin  aspart (NOVOLOG ) 100 UNIT/ML FlexPen 14 UNITS IN AM, 30 UNITS AT LUNCH, 30 UNITS AT [facility] 04/25/20   [provider]  insulin  glargine (LANTUS ) 100 UNIT/ML injection Inject 0.35 mLs (35 Units total) into the skin at bedtime. This is a decrease from your previous 45 units nightly. Patient taking differently: Inject 60 Units into the skin at bedtime. 12/30/19   Garrison Kanner, MD  isosorbide  mononitrate (IMDUR ) 120 MG 24 hr tablet Take 1 tablet (120 mg total) by mouth 2 (two) times daily. 11/03/23   Alexander, Natalie, DO  JARDIANCE 10 MG TABS tablet Take 10 mg by mouth daily. 02/19/22   [provider]  magnesium  oxide (MAG-OX) 400 (240 Mg) MG tablet Take 1 tablet by mouth daily. 04/25/22   [provider]  metoprolol  succinate (TOPROL -XL) 50 MG 24 hr tablet Take 1 tablet by mouth daily. 06/26/23 06/25/24  [provider]  mometasone  (ASMANEX ) 220 MCG/ACT inhaler Inhale 2 puffs into the lungs daily. 05/30/22   [provider]  montelukast  (SINGULAIR ) 10 MG tablet TAKE 1 TABLET AT BEDTIME 08/28/23   Cordie Deters, MD  nitroGLYCERIN  (NITRODUR - DOSED IN MG/24 HR) 0.4 mg/hr patch Place 0.4 mg onto the skin daily. 11/14/23 11/13/24  [provider]  nitroGLYCERIN  (NITROSTAT ) 0.4 MG SL tablet Place 0.4 mg under the tongue every 5 (five) minutes x 3 doses as needed for chest pain.    [provider]  pantoprazole  (PROTONIX ) 40 MG tablet Take 1 tablet (40 mg total) by mouth 2 (two) times daily before a meal. 12/17/19   Amin, Ankit C, MD  ranolazine  (RANEXA ) 1000 MG SR tablet Take 1 tablet (1,000 mg total) by mouth daily. Patient taking differently: Take 500 mg by mouth 2 (two) times daily. 11/04/23   Alexander, Natalie, DO  sodium fluoride (DENTA 5000 PLUS) 1.1 % CREA dental cream See admin instructions. 01/03/22   [provider]  Tiotropium Bromide-Olodaterol (STIOLTO RESPIMAT) 2.5-2.5 MCG/ACT AERS Inhale 1 each into the lungs daily.     [provider]  torsemide  (DEMADEX ) 20 MG tablet Take 40 mg by mouth 2 (two) times daily. Takes 40 mg bid on Monday, Wed and Friday and takes 40 mg once a day all other days    [provider]    Physical Exam: Vitals:   04/23/24 0330 04/23/24 0331 04/23/24 0430  BP: (!) 163/54  (!) 174/70  Pulse: 73  66  Resp: 14  15  Temp: 99.1 F (37.3 C)    TempSrc: Oral    SpO2: 98%  97%  Weight:  81.6 kg   Height:  5\' 6"  (1.676 m)     Constitutional: NAD, calm, comfortable Vitals:   04/23/24 0330 04/23/24 0331 04/23/24 0430  BP: (!) 163/54  (!) 174/70  Pulse: 73  66  Resp: 14  15  Temp: 99.1 F (37.3 C)    TempSrc: Oral    SpO2: 98%  97%  Weight:  81.6 kg   Height:  5\' 6"  (1.676 m)    Eyes: PERRL, lids and conjunctivae normal ENMT: Mucous membranes are moist. Posterior pharynx clear of any exudate or lesions.Normal dentition.  Neck: normal, supple, no masses, no thyromegaly Respiratory: clear to auscultation bilaterally, no wheezing, no crackles. Normal respiratory effort. No accessory muscle use.  Cardiovascular: Regular rate and rhythm, diminished S1 and weak S2, systolic murmur on heart base. No extremity edema. 2+ pedal pulses. No carotid bruits.  Abdomen: no tenderness, no masses palpated. No hepatosplenomegaly. Bowel sounds positive.  Musculoskeletal: no clubbing / cyanosis. No joint deformity upper and lower extremities. Good ROM, no contractures. Normal muscle tone.  Skin: no rashes, lesions, ulcers. No induration Neurologic: CN 2-12 grossly intact. Sensation intact, DTR normal. Strength 5/5 in all 4.  Psychiatric: Normal judgment and insight. Alert and oriented x 3. Normal mood.     Labs on Admission: I have personally reviewed following labs and imaging studies  CBC: Recent Labs  Lab 04/23/24 0328  WBC 9.1  NEUTROABS 6.5  HGB 12.1*  HCT 36.0*  MCV 94.7  PLT 274   Basic Metabolic Panel: Recent Labs  Lab 04/23/24 0328  NA 134*  K 4.2  CL  99  CO2 23  GLUCOSE 250*  BUN 61*  CREATININE 2.92*  CALCIUM  8.6*   GFR: Estimated Creatinine Clearance: 19.2 mL/min (A) (by C-G formula based on SCr of 2.92 mg/dL (H)). Liver Function Tests: Recent Labs  Lab 04/23/24 0328  AST 19  ALT 16  ALKPHOS 51  BILITOT 0.7  PROT 6.8  ALBUMIN  3.6   Recent Labs  Lab 04/23/24 0513  LIPASE 39   No results for input(s): "AMMONIA" in the last 168 hours. Coagulation Profile: No results for input(s): "INR", "PROTIME" in the last 168 hours. Cardiac Enzymes: No results for input(s): "CKTOTAL", "CKMB", "CKMBINDEX", "TROPONINI" in the last 168 hours. BNP (last 3 results) No results for input(s): "PROBNP" in the last 8760 hours. HbA1C: No results for input(s): "HGBA1C" in the last 72 hours. CBG: No results for input(s): "GLUCAP" in the last 168 hours. Lipid Profile: No results for input(s): "CHOL", "HDL", "LDLCALC", "TRIG", "CHOLHDL", "LDLDIRECT" in the last 72 hours. Thyroid  Function Tests: No results for input(s): "TSH", "T4TOTAL", "FREET4", "T3FREE", "THYROIDAB" in the last 72 hours. Anemia Panel: No results for input(s): "VITAMINB12", "FOLATE", "FERRITIN", "TIBC", "IRON ", "RETICCTPCT" in the last 72 hours. Urine analysis:    Component Value Date/Time   COLORURINE STRAW (A) 04/23/2024 0450   APPEARANCEUR CLEAR (A) 04/23/2024 0450   LABSPEC 1.010 04/23/2024 0450   PHURINE 6.0 04/23/2024 0450   GLUCOSEU >=500 (A) 04/23/2024 0450   HGBUR NEGATIVE 04/23/2024 0450   BILIRUBINUR NEGATIVE 04/23/2024 0450   BILIRUBINUR 1+ 11/01/2020 1512   KETONESUR NEGATIVE 04/23/2024 0450   PROTEINUR NEGATIVE 04/23/2024 0450   UROBILINOGEN 2.0 (A) 11/01/2020 1512   NITRITE NEGATIVE 04/23/2024 0450   LEUKOCYTESUR NEGATIVE 04/23/2024 0450    Radiological  Exams on Admission: DG Chest Port 1 View Result Date: 04/23/2024 CLINICAL DATA:  Chest pain EXAM: PORTABLE CHEST 1 VIEW COMPARISON:  11/19/2023 FINDINGS: The heart size and mediastinal contours are  within normal limits. Both lungs are clear. The visualized skeletal structures are unremarkable. IMPRESSION: No active disease. Electronically Signed   By: Juanetta Nordmann M.D.   On: 04/23/2024 04:00    EKG: Independently reviewed.  Sinus rhythm, no acute ST changes, frequent PVCs.  Assessment/Plan Principal Problem:   Chest pain Active Problems:   Angina at rest [facility])  (please populate well all problems here in Problem List. (For example, if patient is on BP meds at home and you resume or decide to hold them, it is a problem that needs to be her. Same for CAD, COPD, HLD and so on)  Anginal-like chest pain - Troponin slightly elevated 24> 37. - Clinically suspect patient has a worsening of moderate aortic stenosis.  As patient also has concurrent signs of aortic stenosis decompensation such as near syncope and mild CHF decompensation in recent month.  Consult cardiology and order echocardiogram. - Dilaudid  as needed for chest pain - Other DDx, worsening of multivessel CAD also likely, will continue Plavix , Imdur  Ranexa  and beta-blocker  CKD stage IV - Euvolemic - Creatinine level stable - Continue torsemide   IDDM, uncontrolled with hyperglycemia - Lantus  20 units daily - NovoLog  8 unit 3 times daily AC - SSI  Chronic HFpEF - Euvolemic, continue torsemide   History of PAF - In sinus rhythm - Not on anticoagulation due to history of severe GI bleed.  DVT prophylaxis: Heparin  subcu Code Status: Full code Family Communication: None at bedside Disposition Plan: Patient is sick with anginal-like chest pain likely secondary to worsening of aortic stenosis requiring inpatient cardiology workup, expect more than 2 midnight hospital stay. Consults called: Cardiology Admission status: Telemetry admission   [place] MD [facility] Pager [phone]  04/23/2024, 8:30 AM

## 2024-04-23 NOTE — Consult Note (Signed)
 [facility] CLINIC CARDIOLOGY CONSULT NOTE       Patient ID: Mike Decker MRN: [id number] DOB/AGE: May 22, 1941 83 y.o.  Admit date: 04/23/2024 Referring Physician Dr. Antoniette Batty Primary Physician Laurence Pons, NP  Primary Cardiologist Dr. Beau Bound Reason for Consultation chest pain  HPI: [name] is a 83 y.o. male  with a past medical history of CAD s/p PCI w/ DES RCA, prox LCx, jailed OM 2 but good flow, HFpEF (EF 55-60% 08/2022), mild to moderate AS (mean grad 19 mmHg), CKD 4, type 2 diabetes, carotid stenosis, COPD, OSA on CPAP, Barrett's esophagus, history of prostate cancer  who presented to the ED on 04/23/2024 for chest pain. Concern for worsening of AS. Cardiology was consulted for further evaluation.   Patient states he reported to ED due to multiple episodes of chest pressure that started this morning at 1 AM. Workup in the ED notable for Cr 2.92, L 4.2, Na 134, Hgb 12.1, WBC 9.1. Lactic acid elevated at 2.0 now normal at 1.5. Troponins 24 > 37. EKG with NSR, no acute ischemic changes. UA concerning for UTI so started on antibiotics. CXR without acute abnormality.  Patient received 1 dose of nitroglycerin , morphine  and Zofran .   At the time of my evaluation this morning patient is resting comfortably in ED stretcher in no acute distress.  We discussed patient's symptoms in further detail.  Patient states about 1 week ago chest pressure came on at rest and resolved with nitroglycerin .  Then at 1 AM this morning he had similar chest pressure that resolved with 3 doses of nitroglycerin .  10 minutes later patient states chest pressure came back and patient took 325 mg aspirin  with no pain resolution.  Patient states when he arrived at the ED his chest pain was 10/10.  Patient states after he received 1 dose of nitro, morphine , Zofran  his chest pain helped some, now 6/10. BP elevated and HR stable this morning.  Pertinent Cardiac History: Cardiac PET Perfusion Test  (01/02/2024):  1. No acute CT findings of the included chest. 2. Dense aortic valve calcifications. Correlate for echocardiographic evidence of aortic valve dysfunction. 3. Extensive three vessel coronary artery calcifications.  LHC (12/13/2022) Left ventriculogram preserved left ventricular function EF around 55 to 60% mild left ventricular enlargement  Left main large minor irregularities  LAD large with diffuse minor irregularities and a 50% mid lesion distal 50-75 Circumflex medium to large minor irregularities widely patent proximal stent OM1 diffusely diseased 50-75 small vessel RCA large proximal stent widely patent 50% mid lesion mixed dominant All vessels with TIMI-3 flow Intervention deferred not indicated   Review of systems complete and found to be negative unless listed above    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, esphageal   Chronic diastolic CHF (congestive heart failure) (HCC)    Colon polyp    Diabetes mellitus without complication (HCC)    Diverticulosis    Gout    Heart disease    Hemangioma    liver   Hyperlipidemia    Hypertension    Myocardial infarct (HCC)    Ocular hypertension    Peripheral vascular disease (HCC)    Skin cancer    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HCC)    Sleep apnea    Vitreoretinal degeneration  Past Surgical History:  Procedure Laterality Date   CATARACT EXTRACTION  2011, 2012   COLONOSCOPY N/A 12/14/2018   Procedure: COLONOSCOPY;  Surgeon: Irby Mannan, MD;  Location: [facility] ENDOSCOPY;  Service: Endoscopy;  Laterality: N/A;   CORONARY ANGIOPLASTY WITH STENT PLACEMENT  12/10/2010   CORONARY STENT INTERVENTION N/A 12/29/2019   Procedure: CORONARY STENT INTERVENTION;  Surgeon: Sammy Crisp, MD;  Location: [facility] INVASIVE CV LAB;  Service: Cardiovascular;  Laterality: N/A;    ESOPHAGOGASTRODUODENOSCOPY N/A 12/14/2018   Procedure: ESOPHAGOGASTRODUODENOSCOPY (EGD);  Surgeon: Irby Mannan, MD;  Location: [facility] ENDOSCOPY;  Service: Endoscopy;  Laterality: N/A;   ESOPHAGOGASTRODUODENOSCOPY (EGD) WITH PROPOFOL  N/A 06/01/2016   Procedure: ESOPHAGOGASTRODUODENOSCOPY (EGD) WITH PROPOFOL ;  Surgeon: Cassie Click, MD;  Location: [facility] ENDOSCOPY;  Service: Endoscopy;  Laterality: N/A;   ESOPHAGOGASTRODUODENOSCOPY (EGD) WITH PROPOFOL  N/A 01/31/2023   Procedure: ESOPHAGOGASTRODUODENOSCOPY (EGD) WITH PROPOFOL ;  Surgeon: Marnee Sink, MD;  Location: [facility] ENDOSCOPY;  Service: Endoscopy;  Laterality: N/A;   FEMORAL ARTERY STENT Right    HERNIA REPAIR     x2   INTRAOCULAR LENS INSERTION     LEFT HEART CATH AND CORONARY ANGIOGRAPHY N/A 05/09/2017   Procedure: Left Heart Cath and Coronary Angiography;  Surgeon: Antonette Batters, MD;  Location: [facility] INVASIVE CV LAB;  Service: Cardiovascular;  Laterality: N/A;   LEFT HEART CATH AND CORONARY ANGIOGRAPHY N/A 12/08/2018   Procedure: LEFT HEART CATH AND CORONARY ANGIOGRAPHY and possible PCI and stent;  Surgeon: Antonette Batters, MD;  Location: [facility] INVASIVE CV LAB;  Service: Cardiovascular;  Laterality: N/A;   LEFT HEART CATH AND CORONARY ANGIOGRAPHY N/A 12/29/2019   Procedure: LEFT HEART CATH AND CORONARY ANGIOGRAPHY;  Surgeon: Sammy Crisp, MD;  Location: [facility] INVASIVE CV LAB;  Service: Cardiovascular;  Laterality: N/A;   LEFT HEART CATH AND CORONARY ANGIOGRAPHY Left 12/13/2022   Procedure: LEFT HEART CATH AND CORONARY ANGIOGRAPHY;  Surgeon: Antonette Batters, MD;  Location: [facility] INVASIVE CV LAB;  Service: Cardiovascular;  Laterality: Left;   NOSE SURGERY     submucous resection   PROSTATE SURGERY  12/10/2000   ROTATOR CUFF REPAIR Right     (Not in a hospital admission)  Social History   Socioeconomic History   Marital status: Married    Spouse name: Not on file   Number of children: 2   Years of education: College 3  years   Highest education level: Associate degree: occupational, Scientist, product/process development, or vocational program  Occupational History   Occupation: retired  Tobacco Use   Smoking status: Former    Current packs/day: 0.00    Types: Cigarettes    Start date: 12/10/1956    Quit date: 12/10/1976    Years since quitting: 47.4   Smokeless tobacco: Never  Vaping Use   Vaping status: Never Used  Substance and Sexual Activity   Alcohol use: No   Drug use: No   Sexual activity: Not Currently  Other Topics Concern   Not on file  Social History Narrative   Not on file   Social Drivers of Health   Financial Resource Strain: Low Risk  (06/21/2023)   Received [name]   Overall Financial Resource Strain (CARDIA)    Difficulty of Paying Living Expenses: Not very hard  Food Insecurity: No Food Insecurity (04/23/2024)   Hunger Vital Sign    Worried About Running Out of Food in the Last Year: Never true    Ran Out of Food in the Last Year: Never true  Transportation  Needs: No Transportation Needs (04/23/2024)   PRAPARE - Administrator, Civil Service (Medical): No    Lack of Transportation (Non-Medical): No  Physical Activity: Insufficiently Active (09/15/2021)   Exercise Vital Sign    Days of Exercise per Week: 7 days    Minutes of Exercise per Session: 20 min  Stress: No Stress Concern Present (09/15/2021)   [employer] of Occupational Health - Occupational Stress Questionnaire    Feeling of Stress : Not at all  Social Connections: Moderately Isolated (04/23/2024)   Social Connection and Isolation Panel [NHANES]    Frequency of Communication with Friends and Family: Once a week    Frequency of Social Gatherings with Friends and Family: Once a week    Attends Religious Services: More than 4 times per year    Active Member of [employer] or Organizations: No    Attends Banker Meetings: Never    Marital Status: Married  Catering manager Violence: Not At Risk  (04/23/2024)   Humiliation, Afraid, Rape, and Kick questionnaire    Fear of Current or Ex-Partner: No    Emotionally Abused: No    Physically Abused: No    Sexually Abused: No    Family History  Problem Relation Age of Onset   Arthritis Mother    Stroke Maternal Grandfather    Breast cancer Neg Hx      Vitals:   04/23/24 0955 04/23/24 1000 04/23/24 1030 04/23/24 1154  BP:  (!) 150/52 (!) 163/68   Pulse: 65 67 70   Resp: 16 14 20    Temp:    97.9 F (36.6 C)  TempSrc:    Oral  SpO2: 96% 96% 96%   Weight:      Height:        PHYSICAL EXAM General: Chronically ill appearing male, well nourished, in no acute distress. HEENT: Normocephalic and atraumatic. Neck: No JVD.  Lungs: Normal respiratory effort on room air. Clear bilaterally to auscultation. No wheezes, crackles, rhonchi.  Heart: HRRR. Normal S1 and S2 without gallops. + systolic murmur.  Abdomen: Non-distended appearing.  Msk: Normal strength and tone for age. Extremities: Warm and well perfused. No clubbing, cyanosis. No edema.  Neuro: Alert and oriented X 3. Psych: Answers questions appropriately.   Labs: Basic Metabolic Panel: Recent Labs    04/23/24 0328  NA 134*  K 4.2  CL 99  CO2 23  GLUCOSE 250*  BUN 61*  CREATININE 2.92*  CALCIUM  8.6*   Liver Function Tests: Recent Labs    04/23/24 0328  AST 19  ALT 16  ALKPHOS 51  BILITOT 0.7  PROT 6.8  ALBUMIN  3.6   Recent Labs    04/23/24 0513  LIPASE 39   CBC: Recent Labs    04/23/24 0328  WBC 9.1  NEUTROABS 6.5  HGB 12.1*  HCT 36.0*  MCV 94.7  PLT 274   Cardiac Enzymes: Recent Labs    04/23/24 0328 04/23/24 0513  TROPONINIHS 24* 37*   BNP: No results for input(s): "BNP" in the last 72 hours. D-Dimer: No results for input(s): "DDIMER" in the last 72 hours. Hemoglobin A1C: No results for input(s): "HGBA1C" in the last 72 hours. Fasting Lipid Panel: No results for input(s): "CHOL", "HDL", "LDLCALC", "TRIG", "CHOLHDL",  "LDLDIRECT" in the last 72 hours. Thyroid  Function Tests: No results for input(s): "TSH", "T4TOTAL", "T3FREE", "THYROIDAB" in the last 72 hours.  Invalid input(s): "FREET3" Anemia Panel: No results for input(s): "VITAMINB12", "FOLATE", "FERRITIN", "TIBC", "IRON ", "RETICCTPCT" in  the last 72 hours.   Radiology: [facility] Chest Port 1 View Result Date: 04/23/2024 CLINICAL DATA:  Chest pain EXAM: PORTABLE CHEST 1 VIEW COMPARISON:  11/19/2023 FINDINGS: The heart size and mediastinal contours are within normal limits. Both lungs are clear. The visualized skeletal structures are unremarkable. IMPRESSION: No active disease. Electronically Signed   By: Juanetta Nordmann M.D.   On: 04/23/2024 04:00    ECHO ordered  TELEMETRY reviewed by me 04/23/2024: sinus rhythm rate 70s  EKG reviewed by me: NSR rate 65 bpm  Data reviewed by me 04/23/2024: last 24h vitals tele labs imaging I/O ED provider note, admission H&P  Principal Problem:   Chest pain Active Problems:   Angina at rest [facility])    ASSESSMENT AND PLAN:  Abrham Clemence is a 82 y.o. male  with a past medical history of CAD s/p PCI w/ DES RCA, prox LCx, jailed OM 2 but good flow, HFpEF (EF 55-60% 08/2022), mild to moderate AS (mean grad 19 mmHg), CKD 4, type 2 diabetes, carotid stenosis, COPD, OSA on CPAP, Barrett's esophagus, history of prostate cancer  who presented to the ED on 04/23/2024 for chest pain. Concern for worsening of AS. Cardiology was consulted for further evaluation.   # Chronic angina # Coronary artery disease s/p multiple prior stents # Moderate aortic stenosis Patient with history of chronic angina presenting with CP which began overnight, took 3 NTG at home and full dose ASA with minimal improvement. CP improved at this time. Troponins 24 > 37. EKG stable from prior, nonacute. BP elevated and HR stable this AM. -Echo ordered to reassess aortic stenosis. Further recommendations pending results. -Increase metoprolol  succinate to 75  mg daily. Continue imdur  120 mg twice daily, ranexa  500 mg twice daily (max dose given renal function).  -Continue lipitor  80 mg daily, plavix  75 mg daily.  -Minimally elevated troponin most consistent with demand/supply mismatch and not ACS. No plan for further cardiac diagnostics.  -PRN Nitropaste ordered for worsening CP.   # Chronic HFpEF Appears euvolemic on exam. CXR without evidence of edema.  -Continue home meds as prescribed.   # Paroxysmal atrial fibrillation Patient with AF not on anticoagulation due to bleeding issues. Remaining in NSR on tele.  -See plan above for metoprolol .    # CKD stage IV Patient with chronic kidney disease with baseline Cr 2.5-3.0, now with Cr 2.92 on presentation.  -Continue close follow up with nephrology.   This patient's plan of care was discussed and created with Dr. Custovic and she is in agreement.  Signed: Creighton Doffing, PA-C  04/23/2024, 12:10 PM [facility] Cardiology

## 2024-04-23 NOTE — ED Provider Notes (Signed)
 [facility] Provider Note    Event Date/Time   First MD Initiated Contact with Patient 04/23/24 0321     (approximate)   History   Chest pain   HPI  Mike Decker is a 83 y.o. male brought to the ED via EMS from home with a chief complaint of chest pain.  Patient with a history of CAD status post non-STEMI, CHF, diabetes, Barrett's esophagus who reports left-sided chest pain radiating to his shoulder and arm.  Took 3 nitroglycerin  at home with partial relief of symptoms.  EMS administered baby aspirin .  Endorses nausea without vomiting.  Denies associated diaphoresis, shortness of breath, palpitations or dizziness.  Denies fever/chills, abdominal pain or diarrhea.     Past Medical History   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, esphageal   Chronic diastolic CHF (congestive heart failure) (HCC)    Colon polyp    Diabetes mellitus without complication (HCC)    Diverticulosis    Gout    Heart disease    Hemangioma    liver   Hyperlipidemia    Hypertension    Myocardial infarct (HCC)    Ocular hypertension    Peripheral vascular disease (HCC)    Skin cancer    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HCC)    Sleep apnea    Vitreoretinal degeneration      Active Problem List   Patient Active Problem List   Diagnosis Date Noted   Atrial fibrillation, chronic (HCC) 10/30/2023   Obesity (BMI 30-39.9) 04/24/2023   Moderate aortic stenosis 04/24/2023   Paroxysmal atrial fibrillation with RVR (HCC) 04/24/2023   Bronchiectasis (HCC) 04/24/2023   Hemoptysis 04/24/2023   Acute renal failure superimposed on stage 4 chronic kidney disease (HCC) 04/18/2023   Leukocytosis 04/18/2023   Dysphagia 01/31/2023   Vitamin B12 deficiency 12/26/2022   Type II diabetes mellitus with renal manifestations (HCC) 08/15/2022    Depression 08/15/2022   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   Urinary tract infection without hematuria 11/01/2020   Dysuria 11/01/2020   Hyposmolality and/or hyponatremia 09/14/2020   Cough 09/13/2020   Bronchitis 07/15/2020   Palpitations 07/15/2020   Aortic atherosclerosis (HCC) 06/21/2020   (HFpEF) heart failure with preserved ejection fraction (HCC) 06/21/2020   Otalgia of both ears 05/27/2020   Lobar pneumonia (HCC)    Type 2 diabetes mellitus with stage 4 chronic kidney disease, without long-term current use of insulin  (HCC) 12/14/2019   Elevated troponin    Acidosis 10/30/2019   Benign 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 10/30/2019   Hyperkalemia 10/30/2019   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/30/2019   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   Encounter for fitting and adjustment of hearing aid 06/16/2019   Health maintenance alteration 06/16/2019   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/16/2019   DM type 2 with diabetic peripheral neuropathy (HCC) 01/25/2019   CKD (chronic kidney disease) stage 4, GFR 15-29 ml/min (HCC) 01/21/2019   Gout 01/21/2019   MR (mitral regurgitation) 01/21/2019   Chronic obstructive pulmonary disease (HCC) 01/08/2019   OSA (obstructive sleep apnea) 01/08/2019   Acute on chronic diastolic CHF (congestive heart failure) (HCC) 12/30/2018   Polyp of sigmoid colon    Benign neoplasm of ascending colon    Hemorrhoids without complication  Diverticulosis of large intestine without diverticulitis    Reflux esophagitis    Lymphangiectasia    Arteriovenous malformation of duodenum    Symptomatic anemia    SOB (shortness of breath)    Iron  deficiency anemia due to chronic blood loss    Unstable angina (HCC)    Anemia of chronic renal failure, stage 4 (severe) (HCC)    Chest pain 11/14/2018   Sciatica 11/12/2018   CAD (coronary artery disease) 02/27/2018   Sacroiliac joint pain 10/08/2017   Pain  in limb 09/24/2017   Neuropathy 07/24/2017   NSTEMI (non-ST elevated myocardial infarction) (HCC) 05/06/2017   PAD (peripheral artery disease) (HCC) 05/01/2017   History of stroke 01/02/2017   Barrett's esophagus 10/02/2016   Carotid stenosis 10/02/2016   Essential hypertension 10/02/2016   Hyperlipidemia 10/02/2016   Diabetes (HCC) 10/02/2016   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) 07/24/2016   Uncontrolled type 2 diabetes mellitus with hyperglycemia, with long-term current use of insulin  (HCC) 04/10/2016   TIA (transient ischemic attack) 10/08/2015     Past Surgical History   Past Surgical History:  Procedure Laterality Date   CATARACT EXTRACTION  2011, 2012   COLONOSCOPY N/A 12/14/2018   Procedure: COLONOSCOPY;  Surgeon: Irby Mannan, MD;  Location: [facility] ENDOSCOPY;  Service: Endoscopy;  Laterality: N/A;   CORONARY ANGIOPLASTY WITH STENT PLACEMENT  12/10/2010   CORONARY STENT INTERVENTION N/A 12/29/2019   Procedure: CORONARY STENT INTERVENTION;  Surgeon: Sammy Crisp, MD;  Location: [facility] INVASIVE CV LAB;  Service: Cardiovascular;  Laterality: N/A;   ESOPHAGOGASTRODUODENOSCOPY N/A 12/14/2018   Procedure: ESOPHAGOGASTRODUODENOSCOPY (EGD);  Surgeon: Irby Mannan, MD;  Location: [facility] ENDOSCOPY;  Service: Endoscopy;  Laterality: N/A;   ESOPHAGOGASTRODUODENOSCOPY (EGD) WITH PROPOFOL  N/A 06/01/2016   Procedure: ESOPHAGOGASTRODUODENOSCOPY (EGD) WITH PROPOFOL ;  Surgeon: Cassie Click, MD;  Location: [facility] ENDOSCOPY;  Service: Endoscopy;  Laterality: N/A;   ESOPHAGOGASTRODUODENOSCOPY (EGD) WITH PROPOFOL  N/A 01/31/2023   Procedure: ESOPHAGOGASTRODUODENOSCOPY (EGD) WITH PROPOFOL ;  Surgeon: Marnee Sink, MD;  Location: [facility] ENDOSCOPY;  Service: Endoscopy;  Laterality: N/A;   FEMORAL ARTERY STENT Right    HERNIA REPAIR     x2   INTRAOCULAR LENS INSERTION     LEFT HEART CATH AND CORONARY ANGIOGRAPHY N/A 05/09/2017   Procedure: Left Heart Cath and Coronary  Angiography;  Surgeon: Antonette Batters, MD;  Location: [facility] INVASIVE CV LAB;  Service: Cardiovascular;  Laterality: N/A;   LEFT HEART CATH AND CORONARY ANGIOGRAPHY N/A 12/08/2018   Procedure: LEFT HEART CATH AND CORONARY ANGIOGRAPHY and possible PCI and stent;  Surgeon: Antonette Batters, MD;  Location: [facility] INVASIVE CV LAB;  Service: Cardiovascular;  Laterality: N/A;   LEFT HEART CATH AND CORONARY ANGIOGRAPHY N/A 12/29/2019   Procedure: LEFT HEART CATH AND CORONARY ANGIOGRAPHY;  Surgeon: Sammy Crisp, MD;  Location: [facility] INVASIVE CV LAB;  Service: Cardiovascular;  Laterality: N/A;   LEFT HEART CATH AND CORONARY ANGIOGRAPHY Left 12/13/2022   Procedure: LEFT HEART CATH AND CORONARY ANGIOGRAPHY;  Surgeon: Antonette Batters, MD;  Location: [facility] INVASIVE CV LAB;  Service: Cardiovascular;  Laterality: Left;   NOSE SURGERY     submucous resection   PROSTATE SURGERY  12/10/2000   ROTATOR CUFF REPAIR Right      Home Medications   Prior to Admission medications   Medication Sig Start Date End Date Taking? Authorizing Provider  acetaminophen  (TYLENOL ) 500 MG tablet Take 1,000 mg by mouth every 8 (eight) hours as needed for mild pain.    [provider]  atorvastatin  (LIPITOR ) 80 MG tablet Take 1 tablet (80 mg total) by mouth daily. 04/25/23   Donaciano Frizzle, MD  azelastine  (ASTELIN ) 0.1 % nasal spray USE 1 TO 2 SPRAYS IN EACH NOSTRIL TWICE A DAY 06/01/20   [provider]  calcitRIOL  (ROCALTROL ) 0.25 MCG capsule Take 0.25 mcg by mouth daily.    [provider]  cetirizine (ZYRTEC) 5 MG tablet Take 5 mg by mouth daily.    [provider]  citalopram  (CELEXA ) 10 MG tablet Take 1 tablet (10 mg total) by mouth daily. 03/12/22   Theron Flavin, MD  clopidogrel  (PLAVIX ) 75 MG tablet TAKE 1 TABLET BY MOUTH EVERY DAY 07/02/22   Masoud, Javed, MD  [employer] VITAMIN B12 1000 MCG tablet TAKE 1 TABLET BY MOUTH EVERY DAY 11/21/21   Timmy Forbes, MD  EPINEPHrine  (EPI-PEN) 0.3 mg/0.3 mL  SOAJ injection Inject into the muscle as needed (anaphylaxis).    [provider]  ergocalciferol  (VITAMIN D2) 1.25 MG (50000 UT) capsule Take 50,000 Units by mouth every 30 (thirty) days.    [provider]  ferrous sulfate  325 (65 FE) MG EC tablet TAKE 1 TABLET BY MOUTH EVERY DAY 02/06/21   Timmy Forbes, MD  fluticasone  (FLONASE ) 50 MCG/ACT nasal spray USE 1 SPRAY IN EACH NOSTRIL EVERY DAY 09/12/20   Theron Flavin, MD  folic acid  (FOLVITE ) 400 MCG tablet Take 400 mcg by mouth daily. 10/14/20   [provider]  insulin  aspart (NOVOLOG ) 100 UNIT/ML FlexPen 14 UNITS IN AM, 30 UNITS AT LUNCH, 30 UNITS AT [facility] 04/25/20   [provider]  insulin  glargine (LANTUS ) 100 UNIT/ML injection Inject 0.35 mLs (35 Units total) into the skin at bedtime. This is a decrease from your previous 45 units nightly. Patient taking differently: Inject 60 Units into the skin at bedtime. 12/30/19   Garrison Kanner, MD  isosorbide  mononitrate (IMDUR ) 120 MG 24 hr tablet Take 1 tablet (120 mg total) by mouth 2 (two) times daily. 11/03/23   Alexander, Natalie, DO  JARDIANCE 10 MG TABS tablet Take 10 mg by mouth daily. 02/19/22   [provider]  magnesium  oxide (MAG-OX) 400 (240 Mg) MG tablet Take 1 tablet by mouth daily. 04/25/22   [provider]  metoprolol  succinate (TOPROL -XL) 50 MG 24 hr tablet Take 1 tablet by mouth daily. 06/26/23 06/25/24  [provider]  mometasone  (ASMANEX ) 220 MCG/ACT inhaler Inhale 2 puffs into the lungs daily. 05/30/22   [provider]  montelukast  (SINGULAIR ) 10 MG tablet TAKE 1 TABLET AT BEDTIME 08/28/23   Cordie Deters, MD  nitroGLYCERIN  (NITRODUR - DOSED IN MG/24 HR) 0.4 mg/hr patch Place 0.4 mg onto the skin daily. 11/14/23 11/13/24  [provider]  nitroGLYCERIN  (NITROSTAT ) 0.4 MG SL tablet Place 0.4 mg under the tongue every 5 (five) minutes x 3 doses as needed for chest pain.    [provider]  pantoprazole   (PROTONIX ) 40 MG tablet Take 1 tablet (40 mg total) by mouth 2 (two) times daily before a meal. 12/17/19   Amin, Ankit C, MD  ranolazine  (RANEXA ) 1000 MG SR tablet Take 1 tablet (1,000 mg total) by mouth daily. Patient taking differently: Take 500 mg by mouth 2 (two) times daily. 11/04/23   Alexander, Natalie, DO  sodium fluoride (DENTA 5000 PLUS) 1.1 % CREA dental cream See admin instructions. 01/03/22   [provider]  Tiotropium Bromide-Olodaterol (STIOLTO RESPIMAT) 2.5-2.5 MCG/ACT AERS Inhale 1 each into the lungs daily.  [provider]  torsemide  (DEMADEX ) 20 MG tablet Take 40 mg by mouth 2 (two) times daily. Takes 40 mg bid on Monday, Wed and Friday and takes 40 mg once a day all other days    [provider]     Allergies  Gabapentin , Peanut-containing drug products, Penicillins, Bee venom, Influenza vaccines, Inh [isoniazid ], Jardiance [empagliflozin], Kenalog [triamcinolone acetonide], Levaquin  [levofloxacin ], Lisinopril , Nalfon [fenoprofen calcium ], Naproxen, Peanut oil, and Nsaids   Family History   Family History  Problem Relation Age of Onset   Arthritis Mother    Stroke Maternal Grandfather    Breast cancer Neg Hx      Physical Exam  Triage Vital Signs: ED Triage Vitals  Encounter Vitals Group     BP      Systolic BP Percentile      Diastolic BP Percentile      Pulse      Resp      Temp      Temp src      SpO2      Weight      Height      Head Circumference      Peak Flow      Pain Score      Pain Loc      Pain Education      Exclude from Growth Chart     Updated Vital Signs: BP (!) 163/54 (BP Location: Right Arm)   Pulse 73   Temp 99.1 F (37.3 C) (Oral)   Resp 14   Ht 5\' 6"  (1.676 m)   Wt 81.6 kg   SpO2 98%   BMI 29.05 kg/m    General: Awake, mild distress.  CV:  RRR.  Good peripheral perfusion.  Resp:  Normal effort.  CTAB. Abd:  Nontender.  No distention.  Other:  Bilateral calves are supple without  tenderness.   ED Results / Procedures / Treatments  Labs (all labs ordered are listed, but only abnormal results are displayed) Labs Reviewed  CBC WITH DIFFERENTIAL/PLATELET - Abnormal; Notable for the following components:      Result Value   RBC 3.80 (*)    Hemoglobin 12.1 (*)    HCT 36.0 (*)    Abs Immature Granulocytes 0.08 (*)    All other components within normal limits  COMPREHENSIVE METABOLIC PANEL WITH GFR - Abnormal; Notable for the following components:   Sodium 134 (*)    Glucose, Bld 250 (*)    BUN 61 (*)    Creatinine, Ser 2.92 (*)    Calcium  8.6 (*)    GFR, Estimated 21 (*)    All other components within normal limits  LACTIC ACID, PLASMA - Abnormal; Notable for the following components:   Lactic Acid, Venous 2.0 (*)    All other components within normal limits  TROPONIN I (HIGH SENSITIVITY) - Abnormal; Notable for the following components:   Troponin I (High Sensitivity) 24 (*)    All other components within normal limits  CULTURE, BLOOD (ROUTINE X 2)  CULTURE, BLOOD (ROUTINE X 2)  RESP PANEL BY RT-PCR (RSV, FLU A&B, COVID)  [id number]  LACTIC ACID, PLASMA  LIPASE, BLOOD  URINALYSIS, W/ REFLEX TO CULTURE (INFECTION SUSPECTED)     EKG  ED ECG REPORT I, Raekwon Winkowski J, the attending physician, personally viewed and interpreted this ECG.   Date: 04/23/2024  EKG Time: 0325  Rate: 82  Rhythm: normal sinus rhythm  Axis: Normal  Intervals:PVCs, QTc 487  ST&T Change: Nonspecific  RADIOLOGY I have independently visualized interpreted patient's imaging study as well as noted the radiology interpretation:  Chest x-ray: No acute cardiopulmonary process  Official radiology report(s): DG Chest Port 1 View Result Date: 04/23/2024 CLINICAL DATA:  Chest pain EXAM: PORTABLE CHEST 1 VIEW COMPARISON:  11/19/2023 FINDINGS: The heart size and mediastinal contours are within normal limits. Both lungs are clear. The visualized skeletal structures are unremarkable.  IMPRESSION: No active disease. Electronically Signed   By: Juanetta Nordmann M.D.   On: 04/23/2024 04:00     PROCEDURES:  Critical Care performed: Yes, see critical care procedure note(s)  CRITICAL CARE Performed by: Norlene Beavers   Total critical care time: 30 minutes  Critical care time was exclusive of separately billable procedures and treating other patients.  Critical care was necessary to treat or prevent imminent or life-threatening deterioration.  Critical care was time spent personally by me on the following activities: development of treatment plan with patient and/or surrogate as well as nursing, discussions with consultants, evaluation of patient's response to treatment, examination of patient, obtaining history from patient or surrogate, ordering and performing treatments and interventions, ordering and review of laboratory studies, ordering and review of radiographic studies, pulse oximetry and re-evaluation of patient's condition.   Aaron Aas1-3 Lead EKG Interpretation  Performed by: Norlene Beavers, MD Authorized by: Norlene Beavers, MD     Interpretation: normal     ECG rate:  80   ECG rate assessment: normal     Rhythm: sinus rhythm     Ectopy: none     Conduction: normal   Comments:     Patient placed on cardiac monitor to evaluate for arrhythmias    MEDICATIONS ORDERED IN ED: Medications  lactated ringers bolus 1,000 mL (has no administration in time range)  aztreonam (AZACTAM) 2 g in sodium chloride  0.9 % 100 mL IVPB (has no administration in time range)  metroNIDAZOLE (FLAGYL) IVPB 500 mg (has no administration in time range)  vancomycin (VANCOREADY) IVPB 2000 mg/400 mL (has no administration in time range)  ondansetron  (ZOFRAN ) injection 4 mg (4 mg Intravenous Given 04/23/24 0337)  morphine  (PF) 2 MG/ML injection 2 mg (2 mg Intravenous Given 04/23/24 0337)  nitroGLYCERIN  (NITROGLYN) 2 % ointment 1 inch (1 inch Topical Given 04/23/24 0426)     IMPRESSION / MDM /  ASSESSMENT AND PLAN / ED COURSE  I reviewed the triage vital signs and the nursing notes.                             83 year old male presenting with chest pain. Differential diagnosis includes, but is not limited to, ACS, aortic dissection, pulmonary embolism, cardiac tamponade, pneumothorax, pneumonia, pericarditis, myocarditis, GI-related causes including esophagitis/gastritis, and musculoskeletal chest wall pain.   I personally reviewed patient's records and note a pulmonology office visit from 04/16/2024 for routine pulmonary follow-up.  Patient's presentation is most consistent with acute complicated illness / injury requiring diagnostic workup.  The patient is on the cardiac monitor to evaluate for evidence of arrhythmia and/or significant heart rate changes.  Will obtain cardiac panel, chest x-ray.  Low-grade fever noted; will obtain lactic acid.  Administer IV morphine  for pain, Zofran  for nausea.  Will reassess.  Clinical Course as of 04/23/24 0430  Thu Apr 23, 2024  0422 Laboratory results demonstrate stable renal function compared to prior, initial troponin mildly elevated, lactic acid 2.0.  Will obtain blood cultures, UA/culture, administer judicious IV fluids given  patient's history of CHF, broad-spectrum IV antibiotics.  Will consult hospitalist services for evaluation and admission. [JS]    Clinical Course User Index [JS] Norlene Beavers, MD     FINAL CLINICAL IMPRESSION(S) / ED DIAGNOSES   Final diagnoses:  Chest pain due to myocardial ischemia, unspecified ischemic chest pain type  Elevated troponin  Sepsis, due to unspecified organism, unspecified whether acute organ dysfunction present (HCC)  Chronic kidney disease, unspecified CKD stage     Rx / DC Orders   ED Discharge Orders     None        Note:  This document was prepared using Dragon voice recognition software and may include unintentional dictation errors.   Rilee Knoll J, MD 04/23/24 [phone]

## 2024-04-23 NOTE — Progress Notes (Signed)
 CODE SEPSIS - PHARMACY COMMUNICATION  **Broad Spectrum Antibiotics should be administered within 1 hour of Sepsis diagnosis**  Time Code Sepsis Called/Page Received: 0420  Antibiotics Ordered: Aztreonam, Flagyl, & Vancomycin  Time of 1st antibiotic administration: 0448  Coretta Dexter, PharmD, [facility] 04/23/2024 4:31 AM

## 2024-04-23 NOTE — Progress Notes (Addendum)
 Elink monitoring for the code sepsis protocol.  [id number] verified with bedside RN that patient's Blood Cultures were drawn prior to Antibiotic start time.

## 2024-04-24 ENCOUNTER — Inpatient Hospital Stay (HOSPITAL_COMMUNITY)
Admit: 2024-04-24 | Discharge: 2024-04-24 | Disposition: A | Discharge status: Home / Self Care | Attending: Student | Admitting: Student

## 2024-04-24 DIAGNOSIS — I200 Unstable angina: Secondary | ICD-10-CM | POA: Diagnosis not present

## 2024-04-24 DIAGNOSIS — I350 Nonrheumatic aortic (valve) stenosis: Secondary | ICD-10-CM | POA: Diagnosis not present

## 2024-04-24 LAB — BLOOD CULTURE ID PANEL (REFLEXED) - BCID2

## 2024-04-24 LAB — CBC
HCT: 32.7 % — ABNORMAL LOW (ref 39.0–52.0)
Hemoglobin: 10.6 g/dL — ABNORMAL LOW (ref 13.0–17.0)
MCH: 31.1 pg (ref 26.0–34.0)
MCHC: 32.4 g/dL (ref 30.0–36.0)
MCV: 95.9 fL (ref 80.0–100.0)
Platelets: 247 10*3/uL (ref 150–400)
RBC: 3.41 MIL/uL — ABNORMAL LOW (ref 4.22–5.81)
RDW: 13.2 % (ref 11.5–15.5)
WBC: 7.1 10*3/uL (ref 4.0–10.5)
nRBC: 0 % (ref 0.0–0.2)

## 2024-04-24 LAB — BASIC METABOLIC PANEL WITH GFR
Anion gap: 10 (ref 5–15)
BUN: 57 mg/dL — ABNORMAL HIGH (ref 8–23)
CO2: 25 mmol/L (ref 22–32)
Calcium: 9 mg/dL (ref 8.9–10.3)
Chloride: 103 mmol/L (ref 98–111)
Creatinine, Ser: 2.83 mg/dL — ABNORMAL HIGH (ref 0.61–1.24)
GFR, Estimated: 21 mL/min — ABNORMAL LOW (ref >60)
Glucose, Bld: 133 mg/dL — ABNORMAL HIGH (ref 70–99)
Potassium: 4.1 mmol/L (ref 3.5–5.1)
Sodium: 138 mmol/L (ref 135–145)

## 2024-04-24 LAB — ECHOCARDIOGRAM COMPLETE
AR max vel: 1.06 cm2
AV Area VTI: 1.2 cm2
AV Area mean vel: 0.94 cm2
AV Mean grad: 25.3 mmHg
AV Peak grad: 39.9 mmHg
Ao pk vel: 3.16 m/s
Area-P 1/2: 3.06 cm2
Height: 66 in
MV VTI: 3.32 cm2
S' Lateral: 3 cm
Weight: 2880 [oz_av]

## 2024-04-24 LAB — GLUCOSE, CAPILLARY
Glucose-Capillary: 135 mg/dL — ABNORMAL HIGH (ref 70–99)
Glucose-Capillary: 241 mg/dL — ABNORMAL HIGH (ref 70–99)
Glucose-Capillary: 185 mg/dL — ABNORMAL HIGH (ref 70–99)
Glucose-Capillary: 216 mg/dL — ABNORMAL HIGH (ref 70–99)

## 2024-04-24 LAB — MAGNESIUM: Magnesium: 2.2 mg/dL (ref 1.7–2.4)

## 2024-04-24 MED ORDER — INSULIN GLARGINE-YFGN 100 UNIT/ML ~~LOC~~ SOLN
15.0000 [IU] | Freq: Every day | SUBCUTANEOUS | Status: DC
  Administered 2024-04-24 – 2024-04-27 (×4): 15 [IU] via SUBCUTANEOUS
  Filled 2024-04-24 (×4): qty 0.15

## 2024-04-24 MED ORDER — METOPROLOL SUCCINATE ER 100 MG PO TB24
100.0000 mg | ORAL_TABLET | Freq: Every day | ORAL | Status: DC
  Administered 2024-04-25 – 2024-04-28 (×4): 100 mg via ORAL
  Filled 2024-04-24 (×4): qty 1

## 2024-04-24 NOTE — Plan of Care (Signed)

## 2024-04-24 NOTE — Progress Notes (Addendum)
 PHARMACY - PHYSICIAN COMMUNICATION CRITICAL VALUE ALERT - BLOOD CULTURE IDENTIFICATION (BCID)  Mike Decker is an 83 y.o. male who presented to [facility]24 with a chief complaint of chest pain.  Assessment: Blood culture with 1/4 bottles methicillin resistance staphylococcus epidermidis. This typically represents contaminant in a patient without active risk factors (prosthetic valve, central lines, IV drug use). Notable that this patient does have aortic stenosis which increases their risk for infective endocarditis in setting of true bacteremia, but does not necessarily increase risk of acquiring MRSE bacteremia. Patient is afebrile, with presentation of possible worsening of aortic stenosis. Do not recommend initiation of abx based on this information at this time.  Name of physician (or Provider) Contacted: Dr. Deena Farrier  Current antibiotics: None  Changes to prescribed antibiotics recommended:  Patient is on recommended antibiotics - No changes needed  Results for orders placed or performed during the hospital encounter of 04/23/24  Blood Culture ID Panel (Reflexed) (Collected: 04/23/2024  4:50 AM)  Result Value Ref Range   Enterococcus faecalis NOT DETECTED NOT DETECTED   Enterococcus Faecium NOT DETECTED NOT DETECTED   Listeria monocytogenes NOT DETECTED NOT DETECTED   Staphylococcus species DETECTED (A) NOT DETECTED   Staphylococcus aureus (BCID) NOT DETECTED NOT DETECTED   Staphylococcus epidermidis DETECTED (A) NOT DETECTED   Staphylococcus lugdunensis NOT DETECTED NOT DETECTED   Streptococcus species NOT DETECTED NOT DETECTED   Streptococcus agalactiae NOT DETECTED NOT DETECTED   Streptococcus pneumoniae NOT DETECTED NOT DETECTED   Streptococcus pyogenes NOT DETECTED NOT DETECTED   A.calcoaceticus-baumannii NOT DETECTED NOT DETECTED   Bacteroides fragilis NOT DETECTED NOT DETECTED   Enterobacterales NOT DETECTED NOT DETECTED   Enterobacter cloacae  complex NOT DETECTED NOT DETECTED   Escherichia coli NOT DETECTED NOT DETECTED   Klebsiella aerogenes NOT DETECTED NOT DETECTED   Klebsiella oxytoca NOT DETECTED NOT DETECTED   Klebsiella pneumoniae NOT DETECTED NOT DETECTED   Proteus species NOT DETECTED NOT DETECTED   Salmonella species NOT DETECTED NOT DETECTED   Serratia marcescens NOT DETECTED NOT DETECTED   Haemophilus influenzae NOT DETECTED NOT DETECTED   Neisseria meningitidis NOT DETECTED NOT DETECTED   Pseudomonas aeruginosa NOT DETECTED NOT DETECTED   Stenotrophomonas maltophilia NOT DETECTED NOT DETECTED   Candida albicans NOT DETECTED NOT DETECTED   Candida auris NOT DETECTED NOT DETECTED   Candida glabrata NOT DETECTED NOT DETECTED   Candida krusei NOT DETECTED NOT DETECTED   Candida parapsilosis NOT DETECTED NOT DETECTED   Candida tropicalis NOT DETECTED NOT DETECTED   Cryptococcus neoformans/gattii NOT DETECTED NOT DETECTED   Methicillin resistance mecA/C DETECTED (A) NOT DETECTED    Mike Decker 04/24/2024  10:48 AM

## 2024-04-24 NOTE — Progress Notes (Signed)
 PROGRESS NOTE    Mike Decker  ZOX:[id number] DOB: 15-Sep-1941 DOA: 04/23/2024 PCP: Laurence Pons, NP   Assessment & Plan:   Principal Problem:   Chest pain Active Problems:   Angina at rest [facility])  Assessment and Plan: Chest pain: chronic angina as per cardio. No CP currently. Increased metoprolol  dose & continue on imdur , ranexa  as per cardio. Continue on tele. 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 Avoid nephrotoxic meds    DM2: poorly controlled. Continue on glargine, SSI w/ accuchecks    Chronic  diastolic CHF: appears euvolemic. Monitor I/Os. Continue on metoprolol , torsemide   PAF: continue on metoprolol . Not on anticoagulation secondary to hx of GI bleed        DVT prophylaxis: heparin   Code Status: full  Family Communication: Disposition Plan: likely d/c back home.  Level of care: Telemetry Cardiac Consultants:  Cardio   Procedures: Antimicrobials:    Subjective: Pt c/o fatigue   Objective: Vitals:   04/24/24 0123 04/24/24 0331 04/24/24 0501 04/24/24 0816  BP: (!) 110/43 (!) 103/37 (!) 115/52 (!) 141/53  Pulse: 77 70 64 70  Resp:  16  18  Temp: 98.2 F (36.8 C) 97.7 F (36.5 C) 97.8 F (36.6 C) 97.8 F (36.6 C)  TempSrc: Axillary   Oral  SpO2:  95%  100%  Weight:      Height:        Intake/Output Summary (Last 24 hours) at 04/24/2024 0835 Last data filed at 04/24/2024 0324 Gross per 24 hour  Intake 200 ml  Output 2500 ml  Net -2300 ml   Filed Weights   04/23/24 0331  Weight: 81.6 kg    Examination:  General exam: Appears calm and comfortable  Respiratory system: Clear to auscultation. Respiratory effort normal. Cardiovascular system: S1 & S2 +. No rubs, gallops or clicks Gastrointestinal system: Abdomen is obese, soft and nontender.  Normal bowel sounds heard. Central nervous system: Alert and oriented. Moves all extremities  Psychiatry: Judgement and insight appears at baseline. Flat mood and  affect     Data Reviewed: I have personally reviewed following labs and imaging studies  CBC: Recent Labs  Lab 04/23/24 0328 04/24/24 0541  WBC 9.1 7.1  NEUTROABS 6.5  --   HGB 12.1* 10.6*  HCT 36.0* 32.7*  MCV 94.7 95.9  PLT 274 247   Basic Metabolic Panel: Recent Labs  Lab 04/23/24 0328 04/24/24 0541  NA 134* 138  K 4.2 4.1  CL 99 103  CO2 23 25  GLUCOSE 250* 133*  BUN 61* 57*  CREATININE 2.92* 2.83*  CALCIUM  8.6* 9.0  MG  --  2.2   GFR: Estimated Creatinine Clearance: 19.8 mL/min (A) (by C-G formula based on SCr of 2.83 mg/dL (H)). Liver Function Tests: Recent Labs  Lab 04/23/24 0328  AST 19  ALT 16  ALKPHOS 51  BILITOT 0.7  PROT 6.8  ALBUMIN  3.6   Recent Labs  Lab 04/23/24 0513  LIPASE 39   No results for input(s): "AMMONIA" in the last 168 hours. Coagulation Profile: No results for input(s): "INR", "PROTIME" in the last 168 hours. Cardiac Enzymes: No results for input(s): "CKTOTAL", "CKMB", "CKMBINDEX", "TROPONINI" in the last 168 hours. BNP (last 3 results) No results for input(s): "PROBNP" in the last 8760 hours. HbA1C: No results for input(s): "HGBA1C" in the last 72 hours. CBG: Recent Labs  Lab 04/23/24 1152 04/23/24 1751 04/23/24 2141 04/24/24 0827  GLUCAP 104* 178* 178* 135*  Lipid Profile: Recent Labs    04/23/24 0513  CHOL 141  HDL 24*  LDLCALC 43  TRIG 742*  CHOLHDL 5.9   Thyroid  Function Tests: No results for input(s): "TSH", "T4TOTAL", "FREET4", "T3FREE", "THYROIDAB" in the last 72 hours. Anemia Panel: No results for input(s): "VITAMINB12", "FOLATE", "FERRITIN", "TIBC", "IRON ", "RETICCTPCT" in the last 72 hours. Sepsis Labs: Recent Labs  Lab 04/23/24 0328 04/23/24 0450  LATICACIDVEN 2.0* 1.5    Recent Results (from the past 240 hours)  Culture, blood (routine x 2)     Status: None (Preliminary result)   Collection Time: 04/23/24  4:50 AM   Specimen: BLOOD RIGHT ARM  Result Value Ref Range Status    Specimen Description BLOOD RIGHT ARM  Final   Special Requests   Final    BOTTLES DRAWN AEROBIC AND ANAEROBIC Blood Culture adequate volume   Culture   Final    NO GROWTH < 24 HOURS Performed at [facility], [address], [place], [place] [address]    Report Status PENDING  Incomplete  Culture, blood (routine x 2)     Status: None (Preliminary result)   Collection Time: 04/23/24  4:50 AM   Specimen: BLOOD LEFT ARM  Result Value Ref Range Status   Specimen Description BLOOD LEFT ARM  Final   Special Requests   Final    BOTTLES DRAWN AEROBIC AND ANAEROBIC Blood Culture adequate volume   Culture   Final    NO GROWTH < 24 HOURS Performed at [name], [address], [place], [place] [address]    Report Status PENDING  Incomplete  Resp panel by RT-PCR (RSV, Flu A&B, Covid) Urine, Clean Catch     Status: None   Collection Time: 04/23/24  4:50 AM   Specimen: Urine, Clean Catch; Nasal Swab  Result Value Ref Range Status   SARS Coronavirus 2 by RT PCR NEGATIVE NEGATIVE Final    Comment: (NOTE) SARS-CoV-2 target nucleic acids are NOT DETECTED.  The SARS-CoV-2 RNA is generally detectable in upper respiratory specimens during the acute phase of infection. The lowest concentration of SARS-CoV-2 viral copies this assay can detect is 138 copies/mL. A negative result does not preclude SARS-Cov-2 infection and should not be used as the sole basis for treatment or other patient management decisions. A negative result may occur with  improper specimen collection/handling, submission of specimen other than nasopharyngeal swab, presence of viral mutation(s) within the areas targeted by this assay, and inadequate number of viral copies(<138 copies/mL). A negative result must be combined with clinical observations, patient history, and epidemiological information. The expected result is Negative.  Fact Sheet for Patients:   BloggerCourse.com  Fact Sheet for Healthcare Providers:  SeriousBroker.it  This test is no t yet approved or cleared by the [place]  FDA and  has been authorized for detection and/or diagnosis of SARS-CoV-2 by FDA under an Emergency Use Authorization (EUA). This EUA will remain  in effect (meaning this test can be used) for the duration of the COVID-19 declaration under Section 564(b)(1) of the Act, 21 U.S.C.section 360bbb-3(b)(1), unless the authorization is terminated  or revoked sooner.       Influenza A by PCR NEGATIVE NEGATIVE Final   Influenza B by PCR NEGATIVE NEGATIVE Final    Comment: (NOTE) The Xpert Xpress SARS-CoV-2/FLU/RSV plus assay is intended as an aid in the diagnosis of influenza from Nasopharyngeal swab specimens and should not be used as a sole basis for treatment. Nasal washings and aspirates are  unacceptable for Xpert Xpress SARS-CoV-2/FLU/RSV testing.  Fact Sheet for Patients: BloggerCourse.com  Fact Sheet for Healthcare Providers: SeriousBroker.it  This test is not yet approved or cleared by the [place]  FDA and has been authorized for detection and/or diagnosis of SARS-CoV-2 by FDA under an Emergency Use Authorization (EUA). This EUA will remain in effect (meaning this test can be used) for the duration of the COVID-19 declaration under Section 564(b)(1) of the Act, 21 U.S.C. section 360bbb-3(b)(1), unless the authorization is terminated or revoked.     Resp Syncytial Virus by PCR NEGATIVE NEGATIVE Final    Comment: (NOTE) Fact Sheet for Patients: BloggerCourse.com  Fact Sheet for Healthcare Providers: SeriousBroker.it  This test is not yet approved or cleared by the [place]  FDA and has been authorized for detection and/or diagnosis of SARS-CoV-2 by FDA under an Emergency Use  Authorization (EUA). This EUA will remain in effect (meaning this test can be used) for the duration of the COVID-19 declaration under Section 564(b)(1) of the Act, 21 U.S.C. section 360bbb-3(b)(1), unless the authorization is terminated or revoked.  Performed at [facility], [address], [place], [place] [address]          Radiology Studies: DG Chest [place] 1 View Result Date: 04/23/2024 CLINICAL DATA:  Chest pain EXAM: PORTABLE CHEST 1 VIEW COMPARISON:  11/19/2023 FINDINGS: The heart size and mediastinal contours are within normal limits. Both lungs are clear. The visualized skeletal structures are unremarkable. IMPRESSION: No active disease. Electronically Signed   By: Juanetta Nordmann M.D.   On: 04/23/2024 04:00        Scheduled Meds:  arformoterol   15 mcg Nebulization BID   And   umeclidinium bromide   1 puff Inhalation Daily   atorvastatin   80 mg Oral Daily   azelastine   1 spray Each Nare BID   budesonide  (PULMICORT ) nebulizer solution  0.25 mg Nebulization BID   citalopram   10 mg Oral Daily   clopidogrel   75 mg Oral Daily   heparin   5,000 Units Subcutaneous Q12H   insulin  aspart  0-5 Units Subcutaneous QHS   insulin  aspart  0-9 Units Subcutaneous TID WC   isosorbide  mononitrate  120 mg Oral BID   loratadine   10 mg Oral Daily   metoprolol  succinate  75 mg Oral Daily   montelukast   10 mg Oral QHS   pantoprazole   40 mg Oral BID AC   ranolazine   500 mg Oral BID   torsemide   40 mg Oral BID   traZODone   25 mg Oral QHS   Continuous Infusions:   LOS: 1 day      Alphonsus Jeans, MD [facility] Pager [phone]  If 7PM-7AM, please contact night-coverage [email or web address] 04/24/2024, 8:35 AM

## 2024-04-24 NOTE — Care Management Important Message (Signed)
 Important Message  Patient Details  Name: Madoxx Hankes MRN: [id number] Date of Birth: [birth year]   Important Message Given:  Yes - Medicare IM     Anise Kerns 04/24/2024, 1:19 PM

## 2024-04-24 NOTE — TOC Progression Note (Signed)
 Transition of Care [facility]) - Progression Note    Patient Details  Name: Mike Decker MRN: [id number] Date of Birth: November 23, 1941  Transition of [facility]) CM/SW Contact  Zoe Hinds, RN Phone Number: 04/24/2024, 4:52 PM  Clinical Narrative:    This spoke with pt's wife and completed assessment . Pt has DME reported by wife to be shower bench, rollator, raised toilet  & RW. Wife informed she can provide DC transportation. TOC will cont to follow dc planning / care coordination and update as applicable.          Expected Discharge Plan and Services                                               Social Determinants of Health (SDOH) Interventions SDOH Screenings   Food Insecurity: No Food Insecurity (04/23/2024)  Housing: Low Risk  (04/23/2024)  Transportation Needs: No Transportation Needs (04/23/2024)  Utilities: Not At Risk (04/23/2024)  Alcohol Screen: Low Risk  (09/04/2018)  Depression (PHQ2-9): Low Risk  (01/20/2024)  Financial Resource Strain: Low Risk  (06/21/2023)   Received from [facility]  Physical Activity: Insufficiently Active (09/15/2021)  Social Connections: Moderately Isolated (04/23/2024)  Stress: No Stress Concern Present (09/15/2021)  Tobacco Use: Medium Risk (04/23/2024)    Readmission Risk Interventions    10/30/2023   11:28 AM  Readmission Risk Prevention Plan  Transportation Screening Complete  PCP or Specialist Appt within 5-7 Days Complete  Home Care Screening Complete  Medication Review (RN CM) Complete

## 2024-04-24 NOTE — Progress Notes (Signed)
 [facility] CLINIC CARDIOLOGY PROGRESS NOTE       Patient ID: Chon Standard MRN: [id number] DOB/AGE: 1941-04-09 83 y.o.  Admit date: 04/23/2024 Referring Physician Dr. Antoniette Batty Primary Physician Laurence Pons, NP  Primary Cardiologist Dr. Beau Bound Reason for Consultation chest pain  HPI: Berthel Kapaun is a 83 y.o. male  with a past medical history of CAD s/p PCI w/ DES RCA, prox LCx, jailed OM 2 but good flow, HFpEF (EF 55-60% 08/2022), mild to moderate AS (mean grad 19 mmHg), CKD 4, type 2 diabetes, carotid stenosis, COPD, OSA on CPAP, Barrett's esophagus, history of prostate cancer  who presented to the ED on 04/23/2024 for chest pain. Concern for worsening of AS. Cardiology was consulted for further evaluation.   Interval History: -Patient seen and examined this AM and sitting upright in bedside chair. Patient reports have mild intermittent chest pain that come on at rest, but it has improved since admission. Currently patient denies chest pain this AM. Denies SOB. -Patients BP mildly elevated and HR  stable this AM. Per tele pt had run of 8 beat run of VT overnight. -Patient remains on room air with stable SpO2.    Pertinent Cardiac History: Cardiac PET Perfusion Test (01/02/2024):  1. No acute CT findings of the included chest. 2. Dense aortic valve calcifications. Correlate for echocardiographic evidence of aortic valve dysfunction. 3. Extensive three vessel coronary artery calcifications.  LHC (12/13/2022) Left ventriculogram preserved left ventricular function EF around 55 to 60% mild left ventricular enlargement  Left main large minor irregularities  LAD large with diffuse minor irregularities and a 50% mid lesion distal 50-75 Circumflex medium to large minor irregularities widely patent proximal stent OM1 diffusely diseased 50-75 small vessel RCA large proximal stent widely patent 50% mid lesion mixed dominant All vessels with TIMI-3 flow Intervention  deferred not indicated   Review of systems complete and found to be negative unless listed above    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, esphageal   Chronic diastolic CHF (congestive heart failure) (HCC)    Colon polyp    Diabetes mellitus without complication (HCC)    Diverticulosis    Gout    Heart disease    Hemangioma    liver   Hyperlipidemia    Hypertension    Myocardial infarct [facility])    Ocular hypertension    Peripheral vascular disease (HCC)    Skin cancer    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HCC)    Sleep apnea    Vitreoretinal degeneration     Past Surgical History:  Procedure Laterality Date   CATARACT EXTRACTION  2011, 2012   COLONOSCOPY N/A 12/14/2018   Procedure: COLONOSCOPY;  Surgeon: Irby Mannan, MD;  Location: [facility] ENDOSCOPY;  Service: Endoscopy;  Laterality: N/A;   CORONARY ANGIOPLASTY WITH STENT PLACEMENT  12/10/2010   CORONARY STENT INTERVENTION N/A 12/29/2019   Procedure: CORONARY STENT INTERVENTION;  Surgeon: Sammy Crisp, MD;  Location: [facility] INVASIVE CV LAB;  Service: Cardiovascular;  Laterality: N/A;   ESOPHAGOGASTRODUODENOSCOPY N/A 12/14/2018   Procedure: ESOPHAGOGASTRODUODENOSCOPY (EGD);  Surgeon: Irby Mannan, MD;  Location: [facility] ENDOSCOPY;  Service: Endoscopy;  Laterality: N/A;   ESOPHAGOGASTRODUODENOSCOPY (EGD) WITH PROPOFOL  N/A 06/01/2016   Procedure: ESOPHAGOGASTRODUODENOSCOPY (EGD) WITH PROPOFOL ;  Surgeon: Cassie Click, MD;  Location: [facility] ENDOSCOPY;  Service: Endoscopy;  Laterality: N/A;   ESOPHAGOGASTRODUODENOSCOPY (EGD) WITH PROPOFOL  N/A 01/31/2023   Procedure: ESOPHAGOGASTRODUODENOSCOPY (EGD) WITH PROPOFOL ;  Surgeon: Marnee Sink, MD;  Location: [facility] ENDOSCOPY;  Service: Endoscopy;  Laterality: N/A;   FEMORAL ARTERY STENT Right    HERNIA REPAIR     x2   INTRAOCULAR LENS  INSERTION     LEFT HEART CATH AND CORONARY ANGIOGRAPHY N/A 05/09/2017   Procedure: Left Heart Cath and Coronary Angiography;  Surgeon: Antonette Batters, MD;  Location: [facility] INVASIVE CV LAB;  Service: Cardiovascular;  Laterality: N/A;   LEFT HEART CATH AND CORONARY ANGIOGRAPHY N/A 12/08/2018   Procedure: LEFT HEART CATH AND CORONARY ANGIOGRAPHY and possible PCI and stent;  Surgeon: Antonette Batters, MD;  Location: [facility] INVASIVE CV LAB;  Service: Cardiovascular;  Laterality: N/A;   LEFT HEART CATH AND CORONARY ANGIOGRAPHY N/A 12/29/2019   Procedure: LEFT HEART CATH AND CORONARY ANGIOGRAPHY;  Surgeon: Sammy Crisp, MD;  Location: [facility] INVASIVE CV LAB;  Service: Cardiovascular;  Laterality: N/A;   LEFT HEART CATH AND CORONARY ANGIOGRAPHY Left 12/13/2022   Procedure: LEFT HEART CATH AND CORONARY ANGIOGRAPHY;  Surgeon: Antonette Batters, MD;  Location: [facility] INVASIVE CV LAB;  Service: Cardiovascular;  Laterality: Left;   NOSE SURGERY     submucous resection   PROSTATE SURGERY  12/10/2000   ROTATOR CUFF REPAIR Right     Medications Prior to Admission  Medication Sig Dispense Refill Last Dose/Taking   acetaminophen  (TYLENOL ) 500 MG tablet Take 1,000 mg by mouth every 8 (eight) hours as needed for mild pain.   Taking As Needed   atorvastatin  (LIPITOR ) 80 MG tablet Take 1 tablet (80 mg total) by mouth daily. 30 tablet 0 04/22/2024   azelastine  (ASTELIN ) 0.1 % nasal spray USE 1 TO 2 SPRAYS IN EACH NOSTRIL TWICE A DAY   04/22/2024   calcitRIOL  (ROCALTROL ) 0.25 MCG capsule Take 0.25 mcg by mouth daily.   04/22/2024   cetirizine (ZYRTEC) 5 MG tablet Take 5 mg by mouth daily.   Taking   citalopram  (CELEXA ) 10 MG tablet Take 1 tablet (10 mg total) by mouth daily. 90 tablet 3 04/22/2024   clopidogrel  (PLAVIX ) 75 MG tablet TAKE 1 TABLET BY MOUTH EVERY DAY 90 tablet 1 04/22/2024   [employer] VITAMIN B12 1000 MCG tablet TAKE 1 TABLET BY MOUTH EVERY DAY 30 tablet 0 04/22/2024   docusate sodium  (COLACE) 100 MG capsule  Take 100 mg by mouth 2 (two) times daily.   04/22/2024   EPINEPHrine  (EPI-PEN) 0.3 mg/0.3 mL SOAJ injection Inject into the muscle as needed (anaphylaxis).   Taking As Needed   ergocalciferol  (VITAMIN D2) 1.25 MG (50000 UT) capsule Take 50,000 Units by mouth every 30 (thirty) days.   04/01/2024   ferrous sulfate  325 (65 FE) MG EC tablet TAKE 1 TABLET BY MOUTH EVERY DAY 90 tablet 3 04/22/2024   fluticasone  (FLONASE ) 50 MCG/ACT nasal spray USE 1 SPRAY IN EACH NOSTRIL EVERY DAY 32 g 6 Past Week   hydrALAZINE  (APRESOLINE ) 50 MG tablet Take 25 mg by mouth 2 (two) times daily.   Taking   insulin  aspart (NOVOLOG ) 100 UNIT/ML FlexPen 14 UNITS IN AM, 30 UNITS AT LUNCH, 30 UNITS AT [facility]   04/22/2024   insulin  glargine (LANTUS ) 100 UNIT/ML injection Inject 0.35 mLs (35 Units total) into the skin at bedtime. This is a decrease from your previous 45 units nightly. (Patient taking differently: Inject 60 Units into the skin at bedtime.) 10 mL 11 04/22/2024   isosorbide   mononitrate (IMDUR ) 120 MG 24 hr tablet Take 1 tablet (120 mg total) by mouth 2 (two) times daily. 60 tablet 0 04/22/2024   JARDIANCE 10 MG TABS tablet Take 10 mg by mouth daily.   04/22/2024   magnesium  oxide (MAG-OX) 400 (240 Mg) MG tablet Take 1 tablet by mouth daily.   04/22/2024   metoprolol  succinate (TOPROL -XL) 50 MG 24 hr tablet Take 1 tablet by mouth daily.   04/22/2024   mometasone  (ASMANEX ) 220 MCG/ACT inhaler Inhale 2 puffs into the lungs daily.   04/22/2024   montelukast  (SINGULAIR ) 10 MG tablet TAKE 1 TABLET AT BEDTIME 90 tablet 3 04/22/2024   nitroGLYCERIN  (NITRODUR - DOSED IN MG/24 HR) 0.4 mg/hr patch Place 0.4 mg onto the skin daily.   04/22/2024   nitroGLYCERIN  (NITROSTAT ) 0.4 MG SL tablet Place 0.4 mg under the tongue every 5 (five) minutes x 3 doses as needed for chest pain.   Taking As Needed   pantoprazole  (PROTONIX ) 40 MG tablet Take 1 tablet (40 mg total) by mouth 2 (two) times daily before a meal.   04/22/2024   ranolazine  (RANEXA ) 500  MG 12 hr tablet Take 1 tablet by mouth 2 (two) times daily.   04/22/2024   Tiotropium Bromide-Olodaterol (STIOLTO RESPIMAT) 2.5-2.5 MCG/ACT AERS Inhale 1 each into the lungs daily.   04/22/2024   torsemide  (DEMADEX ) 20 MG tablet Take 40 mg by mouth 2 (two) times daily.   04/22/2024   folic acid  (FOLVITE ) 400 MCG tablet Take 400 mcg by mouth daily. (Patient not taking: Reported on 04/23/2024)   Not Taking   sodium fluoride (DENTA 5000 PLUS) 1.1 % CREA dental cream See admin instructions.      Social History   Socioeconomic History   Marital status: Married    Spouse name: Not on file   Number of children: 2   Years of education: College 3 years   Highest education level: Associate degree: occupational, Scientist, product/process development, or vocational program  Occupational History   Occupation: retired  Tobacco Use   Smoking status: Former    Current packs/day: 0.00    Types: Cigarettes    Start date: 12/10/1956    Quit date: 12/10/1976    Years since quitting: 47.4   Smokeless tobacco: Never  Vaping Use   Vaping status: Never Used  Substance and Sexual Activity   Alcohol use: No   Drug use: No   Sexual activity: Not Currently  Other Topics Concern   Not on file  Social History Narrative   Not on file   Social Drivers of Health   Financial Resource Strain: Low Risk  (06/21/2023)   Received from [facility]   Overall Financial Resource Strain (CARDIA)    Difficulty of Paying Living Expenses: Not very hard  Food Insecurity: No Food Insecurity (04/23/2024)   Hunger Vital Sign    Worried About Running Out of Food in the Last Year: Never true    Ran Out of Food in the Last Year: Never true  Transportation Needs: No Transportation Needs (04/23/2024)   PRAPARE - Administrator, Civil Service (Medical): No    Lack of Transportation (Non-Medical): No  Physical Activity: Insufficiently Active (09/15/2021)   Exercise Vital Sign    Days of Exercise per Week: 7 days    Minutes of  Exercise per Session: 20 min  Stress: No Stress Concern Present (09/15/2021)   [employer] of Occupational Health - Occupational Stress Questionnaire    Feeling of Stress :  Not at all  Social Connections: Moderately Isolated (04/23/2024)   Social Connection and Isolation Panel [NHANES]    Frequency of Communication with Friends and Family: Once a week    Frequency of Social Gatherings with Friends and Family: Once a week    Attends Religious Services: More than 4 times per year    Active Member of [employer] or Organizations: No    Attends Banker Meetings: Never    Marital Status: Married  Catering manager Violence: Not At Risk (04/23/2024)   Humiliation, Afraid, Rape, and Kick questionnaire    Fear of Current or Ex-Partner: No    Emotionally Abused: No    Physically Abused: No    Sexually Abused: No    Family History  Problem Relation Age of Onset   Arthritis Mother    Stroke Maternal Grandfather    Breast cancer Neg Hx      Vitals:   04/24/24 0501 04/24/24 0809 04/24/24 0816 04/24/24 1128  BP: (!) 115/52  (!) 141/53 (!) 131/54  Pulse: 64  70 75  Resp:   18 18  Temp: 97.8 F (36.6 C)  97.8 F (36.6 C) 98.4 F (36.9 C)  TempSrc:   Oral Oral  SpO2:  96% 100% 98%  Weight:      Height:        PHYSICAL EXAM General: Chronically ill appearing male, well nourished, in no acute distress. HEENT: Normocephalic and atraumatic. Neck: No JVD.  Lungs: Normal respiratory effort on room air. Clear bilaterally to auscultation. No wheezes, crackles, rhonchi.  Heart: HRRR. Normal S1 and S2 without gallops. + systolic murmur.  Abdomen: Non-distended appearing.  Msk: Normal strength and tone for age. Extremities: Warm and well perfused. No clubbing, cyanosis. No edema.  Neuro: Alert and oriented X 3. Psych: Answers questions appropriately.   Labs: Basic Metabolic Panel: Recent Labs    04/23/24 0328 04/24/24 0541  NA 134* 138  K 4.2 4.1  CL 99 103  CO2 23 25   GLUCOSE 250* 133*  BUN 61* 57*  CREATININE 2.92* 2.83*  CALCIUM  8.6* 9.0  MG  --  2.2   Liver Function Tests: Recent Labs    04/23/24 0328  AST 19  ALT 16  ALKPHOS 51  BILITOT 0.7  PROT 6.8  ALBUMIN  3.6   Recent Labs    04/23/24 0513  LIPASE 39   CBC: Recent Labs    04/23/24 0328 04/24/24 0541  WBC 9.1 7.1  NEUTROABS 6.5  --   HGB 12.1* 10.6*  HCT 36.0* 32.7*  MCV 94.7 95.9  PLT 274 247   Cardiac Enzymes: Recent Labs    04/23/24 0328 04/23/24 0513  TROPONINIHS 24* 37*   BNP: No results for input(s): "BNP" in the last 72 hours. D-Dimer: No results for input(s): "DDIMER" in the last 72 hours. Hemoglobin A1C: No results for input(s): "HGBA1C" in the last 72 hours. Fasting Lipid Panel: Recent Labs    04/23/24 0513  CHOL 141  HDL 24*  LDLCALC 43  TRIG 161*  CHOLHDL 5.9   Thyroid  Function Tests: No results for input(s): "TSH", "T4TOTAL", "T3FREE", "THYROIDAB" in the last 72 hours.  Invalid input(s): "FREET3" Anemia Panel: No results for input(s): "VITAMINB12", "FOLATE", "FERRITIN", "TIBC", "IRON ", "RETICCTPCT" in the last 72 hours.   Radiology: ECHOCARDIOGRAM COMPLETE Result Date: 04/24/2024    ECHOCARDIOGRAM REPORT   Patient Name:   Muaad [name] Barreira Date of Exam: 04/24/2024 Medical Rec #:  [id number]  Height:       66.0 in Accession #:    [id number]           Weight:       180.0 lb Date of Birth:  12-15-1940            BSA:          1.912 m Patient Age:    82 years             BP:           115/52 mmHg Patient Gender: M                    HR:           64 bpm. Exam Location:  [facility] Procedure: 2D Echo, Cardiac Doppler and Color Doppler (Both Spectral and Color            Flow Doppler were utilized during procedure). Indications:     Aortic stenosis I35.0  History:         Patient has prior history of Echocardiogram examinations, most                  recent 04/18/2023. Risk Factors:Hypertension, Diabetes and                  Dyslipidemia.   Sonographer:     Broadus Canes Referring Phys:  [id number] CARALYN HUDSON Diagnosing Phys: Sabina Custovic IMPRESSIONS  1. Left ventricular ejection fraction, by estimation, is 60 to 65%. The left ventricle has normal function. The left ventricle has no regional wall motion abnormalities. Left ventricular diastolic parameters are consistent with Grade II diastolic dysfunction (pseudonormalization).  2. Right ventricular systolic function is normal. The right ventricular size is normal.  3. Left atrial size was mildly dilated.  4. The mitral valve is normal in structure. Mild mitral valve regurgitation. No evidence of mitral stenosis.  5. The aortic valve is calcified. Aortic valve regurgitation is not visualized. Mild to moderate aortic valve stenosis. Aortic valve area, by VTI measures 1.20 cm. Aortic valve mean gradient measures 25.3 mmHg. Aortic valve Vmax measures 3.16 m/s.  6. The inferior vena cava is normal in size with greater than 50% respiratory variability, suggesting right atrial pressure of 3 mmHg. FINDINGS  Left Ventricle: Left ventricular ejection fraction, by estimation, is 60 to 65%. The left ventricle has normal function. The left ventricle has no regional wall motion abnormalities. The left ventricular internal cavity size was normal in size. There is  no left ventricular hypertrophy. Left ventricular diastolic parameters are consistent with Grade II diastolic dysfunction (pseudonormalization). Right Ventricle: The right ventricular size is normal. No increase in right ventricular wall thickness. Right ventricular systolic function is normal. Left Atrium: Left atrial size was mildly dilated. Right Atrium: Right atrial size was normal in size. Pericardium: There is no evidence of pericardial effusion. Mitral Valve: The mitral valve is normal in structure. Mild mitral valve regurgitation. No evidence of mitral valve stenosis. MV peak gradient, 7.5 mmHg. The mean mitral valve gradient is 3.0 mmHg.  Tricuspid Valve: The tricuspid valve is normal in structure. Tricuspid valve regurgitation is trivial. Aortic Valve: The aortic valve is calcified. Aortic valve regurgitation is not visualized. Mild to moderate aortic stenosis is present. Aortic valve mean gradient measures 25.3 mmHg. Aortic valve peak gradient measures 39.9 mmHg. Aortic valve area, by VTI measures 1.20 cm. Pulmonic Valve: The pulmonic valve was normal in structure. Pulmonic valve regurgitation is not visualized. Aorta:  The aortic root is normal in size and structure. Venous: The inferior vena cava is normal in size with greater than 50% respiratory variability, suggesting right atrial pressure of 3 mmHg. IAS/Shunts: No atrial level shunt detected by color flow Doppler.  LEFT VENTRICLE PLAX 2D LVIDd:         4.60 cm   Diastology LVIDs:         3.00 cm   LV e' medial:    4.79 cm/s LV PW:         1.00 cm   LV E/e' medial:  16.6 LV IVS:        1.30 cm   LV e' lateral:   6.42 cm/s LVOT diam:     2.20 cm   LV E/e' lateral: 12.4 LV SV:         99 LV SV Index:   52 LVOT Area:     3.80 cm  RIGHT VENTRICLE RV Basal diam:  2.60 cm RV Mid diam:    2.70 cm RV S prime:     13.50 cm/s TAPSE (M-mode): 2.2 cm LEFT ATRIUM             Index        RIGHT ATRIUM          Index LA diam:        4.10 cm 2.14 cm/m   RA Area:     9.12 cm LA Vol (A2C):   70.3 ml 36.76 ml/m  RA Volume:   16.80 ml 8.79 ml/m LA Vol (A4C):   55.6 ml 29.08 ml/m LA Biplane Vol: 62.8 ml 32.84 ml/m  AORTIC VALVE AV Area (Vmax):    1.06 cm AV Area (Vmean):   0.94 cm AV Area (VTI):     1.20 cm AV Vmax:           315.67 cm/s AV Vmean:          240.667 cm/s AV VTI:            0.822 m AV Peak Grad:      39.9 mmHg AV Mean Grad:      25.3 mmHg LVOT Vmax:         87.80 cm/s LVOT Vmean:        59.300 cm/s LVOT VTI:          0.260 m LVOT/AV VTI ratio: 0.32  AORTA Ao Root diam: 3.20 cm MITRAL VALVE                TRICUSPID VALVE MV Area (PHT): 3.06 cm     TR Peak grad:   20.2 mmHg MV Area VTI:    3.32 cm     TR Vmax:        225.00 cm/s MV Peak grad:  7.5 mmHg MV Mean grad:  3.0 mmHg     SHUNTS MV Vmax:       1.37 m/s     Systemic VTI:  0.26 m MV Vmean:      79.0 cm/s    Systemic Diam: 2.20 cm MV Decel Time: 248 msec MV E velocity: 79.70 cm/s MV A velocity: 127.00 cm/s MV E/A ratio:  0.63 Sabina Custovic Electronically signed by Isabell Manzanilla Signature Date/Time: 04/24/2024/11:59:28 AM    Final    DG Chest Port 1 View Result Date: 04/23/2024 CLINICAL DATA:  Chest pain EXAM: PORTABLE CHEST 1 VIEW COMPARISON:  11/19/2023 FINDINGS: The heart size and mediastinal contours are within normal limits. Both lungs are clear.  The visualized skeletal structures are unremarkable. IMPRESSION: No active disease. Electronically Signed   By: Juanetta Nordmann M.D.   On: 04/23/2024 04:00    ECHO as above  TELEMETRY reviewed by me 04/24/2024: sinus rhythm with PVCs, rate 70s  EKG reviewed by me: NSR rate 65 bpm  Data reviewed by me 04/24/2024: last 24h vitals tele labs imaging I/O ED provider note, admission H&P  Principal Problem:   Chest pain Active Problems:   Angina at rest [facility])    ASSESSMENT AND PLAN:  Fiore Frangella is a 83 y.o. male  with a past medical history of CAD s/p PCI w/ DES RCA, prox LCx, jailed OM 2 but good flow, HFpEF (EF 55-60% 08/2022), mild to moderate AS (mean grad 19 mmHg), CKD 4, type 2 diabetes, carotid stenosis, COPD, OSA on CPAP, Barrett's esophagus, history of prostate cancer  who presented to the ED on 04/23/2024 for chest pain. Concern for worsening of AS. Cardiology was consulted for further evaluation.   # Chronic angina # Coronary artery disease s/p multiple prior stents # Moderate aortic stenosis Patient with history of chronic angina presenting with CP which began overnight, took 3 NTG at home and full dose ASA with minimal improvement. CP improved at this time. Troponins 24 > 37. EKG stable from prior, nonacute. Echo this admission with pEF, no RWMA, grade II  diastolic dysfunction, mild MR, mild to moderate AS. Per tele overnight had 8 beats of VT.  -Increase metoprolol  succinate to 100 mg daily. Continue imdur  120 mg twice daily, ranexa  500 mg twice daily (max dose given renal function).  -Continue lipitor  80 mg daily, plavix  75 mg daily.  -Minimally elevated troponin most consistent with demand/supply mismatch and not ACS.  -PRN Nitropaste ordered for worsening CP.  -Consider outpatient LHC with Dr. Marco Severs to further evaluate intermittent chest pain and frequent PVCs.  # Chronic HFpEF Appears euvolemic on exam. CXR without evidence of edema.  -Continue home meds as prescribed.   # Paroxysmal atrial fibrillation Patient with AF not on anticoagulation due to bleeding issues. Remaining in NSR on tele.  -See plan above for metoprolol .    # CKD stage IV Patient with chronic kidney disease with baseline Cr 2.5-3.0, now with Cr 2.92 on presentation.  -Continue close follow up with nephrology.   This patient's plan of care was discussed and created with Dr. Custovic and she is in agreement.  Signed: Creighton Doffing, PA-C  04/24/2024, 12:41 PM [facility]

## 2024-04-24 NOTE — Progress Notes (Signed)
*  PRELIMINARY RESULTS* Echocardiogram 2D Echocardiogram has been performed.  Broadus Canes 04/24/2024, 7:49 AM

## 2024-04-24 NOTE — Progress Notes (Signed)
 Patient with 8 beats VT runs, pt is asleep, notified provider, has BMP this am, Mg lab ordered. No other concern at the moment. Plan of care continued.

## 2024-04-25 DIAGNOSIS — R079 Chest pain, unspecified: Secondary | ICD-10-CM

## 2024-04-25 DIAGNOSIS — I200 Unstable angina: Secondary | ICD-10-CM | POA: Diagnosis not present

## 2024-04-25 LAB — CBC
HCT: 31 % — ABNORMAL LOW (ref 39.0–52.0)
Hemoglobin: 10.3 g/dL — ABNORMAL LOW (ref 13.0–17.0)
MCH: 31.4 pg (ref 26.0–34.0)
MCHC: 33.2 g/dL (ref 30.0–36.0)
MCV: 94.5 fL (ref 80.0–100.0)
Platelets: 274 10*3/uL (ref 150–400)
RBC: 3.28 MIL/uL — ABNORMAL LOW (ref 4.22–5.81)
RDW: 13.1 % (ref 11.5–15.5)
WBC: 8.3 10*3/uL (ref 4.0–10.5)
nRBC: 0 % (ref 0.0–0.2)

## 2024-04-25 LAB — BASIC METABOLIC PANEL WITH GFR
Anion gap: 12 (ref 5–15)
BUN: 66 mg/dL — ABNORMAL HIGH (ref 8–23)
CO2: 23 mmol/L (ref 22–32)
Calcium: 8.8 mg/dL — ABNORMAL LOW (ref 8.9–10.3)
Chloride: 102 mmol/L (ref 98–111)
Creatinine, Ser: 3.08 mg/dL — ABNORMAL HIGH (ref 0.61–1.24)
GFR, Estimated: 19 mL/min — ABNORMAL LOW (ref >60)
Glucose, Bld: 154 mg/dL — ABNORMAL HIGH (ref 70–99)
Potassium: 3.8 mmol/L (ref 3.5–5.1)
Sodium: 137 mmol/L (ref 135–145)

## 2024-04-25 LAB — CULTURE, BLOOD (ROUTINE X 2): Special Requests: ADEQUATE

## 2024-04-25 LAB — GLUCOSE, CAPILLARY
Glucose-Capillary: 194 mg/dL — ABNORMAL HIGH (ref 70–99)
Glucose-Capillary: 156 mg/dL — ABNORMAL HIGH (ref 70–99)
Glucose-Capillary: 269 mg/dL — ABNORMAL HIGH (ref 70–99)
Glucose-Capillary: 262 mg/dL — ABNORMAL HIGH (ref 70–99)

## 2024-04-25 MED ORDER — TORSEMIDE 20 MG PO TABS
40.0000 mg | ORAL_TABLET | Freq: Every day | ORAL | Status: DC
  Administered 2024-04-26: 40 mg via ORAL
  Filled 2024-04-25: qty 2

## 2024-04-25 MED ORDER — MAGNESIUM SULFATE 2 GM/50ML IV SOLN
2.0000 g | Freq: Once | INTRAVENOUS | Status: AC
  Administered 2024-04-25: 2 g via INTRAVENOUS
  Filled 2024-04-25: qty 50

## 2024-04-25 NOTE — Plan of Care (Signed)
  Problem: Education: Goal: Knowledge of General Education information will improve Description: Including pain rating scale, medication(s)/side effects and non-pharmacologic comfort measures Outcome: Progressing   Problem: Clinical Measurements: Goal: Ability to maintain clinical measurements within normal limits will improve Outcome: Progressing   Problem: Clinical Measurements: Goal: Will remain free from infection Outcome: Progressing   Problem: Clinical Measurements: Goal: Diagnostic test results will improve Outcome: Progressing   Problem: Clinical Measurements: Goal: Respiratory complications will improve Outcome: Progressing   Problem: Clinical Measurements: Goal: Cardiovascular complication will be avoided Outcome: Progressing   Problem: Coping: Goal: Level of anxiety will decrease Outcome: Progressing   Problem: Pain Managment: Goal: General experience of comfort will improve and/or be controlled Outcome: Progressing   Problem: Safety: Goal: Ability to remain free from injury will improve Outcome: Progressing   Problem: Metabolic: Goal: Ability to maintain appropriate glucose levels will improve Outcome: Progressing   Plan of care ongoing see MAR see flowsheet

## 2024-04-25 NOTE — Progress Notes (Signed)
 Mobility Specialist - Progress Note   04/25/24 0912  Mobility  Activity Ambulated with assistance in hallway;Stood at bedside;Dangled on edge of bed  Level of Assistance Standby assist, set-up cues, supervision of patient - no hands on  Assistive Device Front wheel walker  Distance Ambulated (ft) 100 ft  Activity Response Tolerated well  Mobility Referral Yes  Mobility visit 1 Mobility  Mobility Specialist Start Time (ACUTE ONLY) [id number]  Mobility Specialist Stop Time (ACUTE ONLY) 0849  Mobility Specialist Time Calculation (min) (ACUTE ONLY) 17 min   Pt sitting in recliner on RA upon arrival. Pt STS and ambulates in hallway SBA with no LOB noted. Pt returns to recliner with needs in reach.   Wash Hack  Mobility Specialist  04/25/24 9:13 AM

## 2024-04-25 NOTE — Progress Notes (Signed)
 PROGRESS NOTE    Mike Decker  VWU:[id number] DOB: 03/02/1941 DOA: 04/23/2024 PCP: Laurence Pons, NP   Assessment & Plan:   Principal Problem:   Chest pain Active Problems:   Angina at rest [facility])  Assessment and Plan: Chest pain: chronic angina as per cardio. C/o chest pain today. Continue on metoprolol , imdur , ranexa  as per cardio. Continue on tele. Cardio is following and recs apprec   Hemoptysis: CXR shows no active disease. Will continue to monitor H&H.    CKDIV: Cr is trending up today. Avoid nephrotoxic meds. 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 No need for a transfusion currently    DM2: poorly controlled. Continue on glargine, SSI w/ accuchecks    Chronic diastolic CHF: appears euvolemic. Continue on torsemide , metoprolol . Monitor I/Os  PAF: continue on metoprolol . Not on anticoagulation secondary to hx of GI bleed         DVT prophylaxis: heparin   Code Status: full  Family Communication: Disposition Plan: likely d/c back home.  Level of care: Telemetry Cardiac Consultants:  Cardio  Nephro   Procedures: Antimicrobials:    Subjective: Pt c/o chest pain   Objective: Vitals:   04/24/24 2005 04/25/24 0016 04/25/24 0339 04/25/24 0750  BP:  113/61 (!) 94/36   Pulse:  61 64   Resp:  20 20   Temp:  97.6 F (36.4 C) 98.2 F (36.8 C)   TempSrc:  Axillary    SpO2: 98% 97% 96% 96%  Weight:      Height:        Intake/Output Summary (Last 24 hours) at 04/25/2024 0831 Last data filed at 04/25/2024 0500 Gross per 24 hour  Intake 910 ml  Output 2000 ml  Net -1090 ml   Filed Weights   04/23/24 0331  Weight: 81.6 kg    Examination:  General exam: Appears comfortable  Respiratory system: decreased breath sounds b/l. Cardiovascular system: S1/S2+. No rubs or clicks  Gastrointestinal system: Abd is soft, NT, ND & hypoactive bowel sounds  Central nervous system: alert & oriented. Moves all extremities   Psychiatry: Judgement and insight appears at baseline. Flat mood and affect    Data Reviewed: I have personally reviewed following labs and imaging studies  CBC: Recent Labs  Lab 04/23/24 0328 04/24/24 0541 04/25/24 0533  WBC 9.1 7.1 8.3  NEUTROABS 6.5  --   --   HGB 12.1* 10.6* 10.3*  HCT 36.0* 32.7* 31.0*  MCV 94.7 95.9 94.5  PLT 274 247 274   Basic Metabolic Panel: Recent Labs  Lab 04/23/24 0328 04/24/24 0541 04/25/24 0533  NA 134* 138 137  K 4.2 4.1 3.8  CL 99 103 102  CO2 23 25 23   GLUCOSE 250* 133* 154*  BUN 61* 57* 66*  CREATININE 2.92* 2.83* 3.08*  CALCIUM  8.6* 9.0 8.8*  MG  --  2.2  --    GFR: Estimated Creatinine Clearance: 18.2 mL/min (A) (by C-G formula based on SCr of 3.08 mg/dL (H)). Liver Function Tests: Recent Labs  Lab 04/23/24 0328  AST 19  ALT 16  ALKPHOS 51  BILITOT 0.7  PROT 6.8  ALBUMIN  3.6   Recent Labs  Lab 04/23/24 0513  LIPASE 39   No results for input(s): "AMMONIA" in the last 168 hours. Coagulation Profile: No results for input(s): "INR", "PROTIME" in the last 168 hours. Cardiac Enzymes: No results for input(s): "CKTOTAL", "CKMB", "CKMBINDEX", "TROPONINI" in the last 168 hours. BNP (last 3 results) No  results for input(s): "PROBNP" in the last 8760 hours. HbA1C: No results for input(s): "HGBA1C" in the last 72 hours. CBG: Recent Labs  Lab 04/23/24 2141 04/24/24 0827 04/24/24 1135 04/24/24 1649 04/24/24 2108  GLUCAP 178* 135* 241* 185* 216*   Lipid Profile: Recent Labs    04/23/24 0513  CHOL 141  HDL 24*  LDLCALC 43  TRIG 657*  CHOLHDL 5.9   Thyroid  Function Tests: No results for input(s): "TSH", "T4TOTAL", "FREET4", "T3FREE", "THYROIDAB" in the last 72 hours. Anemia Panel: No results for input(s): "VITAMINB12", "FOLATE", "FERRITIN", "TIBC", "IRON ", "RETICCTPCT" in the last 72 hours. Sepsis Labs: Recent Labs  Lab 04/23/24 0328 04/23/24 0450  LATICACIDVEN 2.0* 1.5    Recent Results (from the past  240 hours)  Culture, blood (routine x 2)     Status: None (Preliminary result)   Collection Time: 04/23/24  4:50 AM   Specimen: BLOOD RIGHT ARM  Result Value Ref Range Status   Specimen Description BLOOD RIGHT ARM  Final   Special Requests   Final    BOTTLES DRAWN AEROBIC AND ANAEROBIC Blood Culture adequate volume   Culture   Final    NO GROWTH 2 DAYS Performed at [facility], [address], [place], [place] [address]    Report Status PENDING  Incomplete  Culture, blood (routine x 2)     Status: None (Preliminary result)   Collection Time: 04/23/24  4:50 AM   Specimen: BLOOD LEFT ARM  Result Value Ref Range Status   Specimen Description BLOOD LEFT ARM  Final   Special Requests   Final    BOTTLES DRAWN AEROBIC AND ANAEROBIC Blood Culture adequate volume   Culture  Setup Time   Final    GRAM POSITIVE COCCI AEROBIC BOTTLE ONLY Organism ID to follow CRITICAL RESULT CALLED TO, READ BACK BY AND VERIFIED WITH: ALEX CHAPPELL @1033  04/24/24 MJU Performed at [facility], [address] [place], [place] [address]    Culture GRAM POSITIVE COCCI  Final   Report Status PENDING  Incomplete  Resp panel by RT-PCR (RSV, Flu A&B, Covid) Urine, Clean Catch     Status: None   Collection Time: 04/23/24  4:50 AM   Specimen: Urine, Clean Catch; Nasal Swab  Result Value Ref Range Status   SARS Coronavirus 2 by RT PCR NEGATIVE NEGATIVE Final    Comment: (NOTE) SARS-CoV-2 target nucleic acids are NOT DETECTED.  The SARS-CoV-2 RNA is generally detectable in upper respiratory specimens during the acute phase of infection. The lowest concentration of SARS-CoV-2 viral copies this assay can detect is 138 copies/mL. A negative result does not preclude SARS-Cov-2 infection and should not be used as the sole basis for treatment or other patient management decisions. A negative result may occur with  improper specimen collection/handling, submission of specimen other than  nasopharyngeal swab, presence of viral mutation(s) within the areas targeted by this assay, and inadequate number of viral copies(<138 copies/mL). A negative result must be combined with clinical observations, patient history, and epidemiological information. The expected result is Negative.  Fact Sheet for Patients:  BloggerCourse.com  Fact Sheet for Healthcare Providers:  SeriousBroker.it  This test is no t yet approved or cleared by the [place]  FDA and  has been authorized for detection and/or diagnosis of SARS-CoV-2 by FDA under an Emergency Use Authorization (EUA). This EUA will remain  in effect (meaning this test can be used) for the duration of the COVID-19 declaration under Section 564(b)(1) of the Act, 21  U.S.C.section 360bbb-3(b)(1), unless the authorization is terminated  or revoked sooner.       Influenza A by PCR NEGATIVE NEGATIVE Final   Influenza B by PCR NEGATIVE NEGATIVE Final    Comment: (NOTE) The Xpert Xpress SARS-CoV-2/FLU/RSV plus assay is intended as an aid in the diagnosis of influenza from Nasopharyngeal swab specimens and should not be used as a sole basis for treatment. Nasal washings and aspirates are unacceptable for Xpert Xpress SARS-CoV-2/FLU/RSV testing.  Fact Sheet for Patients: BloggerCourse.com  Fact Sheet for Healthcare Providers: SeriousBroker.it  This test is not yet approved or cleared by the [place]  FDA and has been authorized for detection and/or diagnosis of SARS-CoV-2 by FDA under an Emergency Use Authorization (EUA). This EUA will remain in effect (meaning this test can be used) for the duration of the COVID-19 declaration under Section 564(b)(1) of the Act, 21 U.S.C. section 360bbb-3(b)(1), unless the authorization is terminated or revoked.     Resp Syncytial Virus by PCR NEGATIVE NEGATIVE Final    Comment:  (NOTE) Fact Sheet for Patients: BloggerCourse.com  Fact Sheet for Healthcare Providers: SeriousBroker.it  This test is not yet approved or cleared by the [place]  FDA and has been authorized for detection and/or diagnosis of SARS-CoV-2 by FDA under an Emergency Use Authorization (EUA). This EUA will remain in effect (meaning this test can be used) for the duration of the COVID-19 declaration under Section 564(b)(1) of the Act, 21 U.S.C. section 360bbb-3(b)(1), unless the authorization is terminated or revoked.  Performed at [facility], [address] [place], [place] [address]   Blood Culture ID Panel (Reflexed)     Status: Abnormal   Collection Time: 04/23/24  4:50 AM  Result Value Ref Range Status   Enterococcus faecalis NOT DETECTED NOT DETECTED Final   Enterococcus Faecium NOT DETECTED NOT DETECTED Final   Listeria monocytogenes NOT DETECTED NOT DETECTED Final   Staphylococcus species DETECTED (A) NOT DETECTED Final    Comment: CRITICAL RESULT CALLED TO, READ BACK BY AND VERIFIED WITH: ALEX CHAPPELL @1033  04/24/24 MJU    Staphylococcus aureus (BCID) NOT DETECTED NOT DETECTED Final   Staphylococcus epidermidis DETECTED (A) NOT DETECTED Final    Comment: Methicillin (oxacillin) resistant coagulase negative staphylococcus. Possible blood culture contaminant (unless isolated from more than one blood culture draw or clinical case suggests pathogenicity). No antibiotic treatment is indicated for blood  culture contaminants. CRITICAL RESULT CALLED TO, READ BACK BY AND VERIFIED WITH: ALEX CHAPPELL @1033  04/24/24 MJU    Staphylococcus lugdunensis NOT DETECTED NOT DETECTED Final   Streptococcus species NOT DETECTED NOT DETECTED Final   Streptococcus agalactiae NOT DETECTED NOT DETECTED Final   Streptococcus pneumoniae NOT DETECTED NOT DETECTED Final   Streptococcus pyogenes NOT DETECTED NOT DETECTED Final    A.calcoaceticus-baumannii NOT DETECTED NOT DETECTED Final   Bacteroides fragilis NOT DETECTED NOT DETECTED Final   Enterobacterales NOT DETECTED NOT DETECTED Final   Enterobacter cloacae complex NOT DETECTED NOT DETECTED Final   Escherichia coli NOT DETECTED NOT DETECTED Final   Klebsiella aerogenes NOT DETECTED NOT DETECTED Final   Klebsiella oxytoca NOT DETECTED NOT DETECTED Final   Klebsiella pneumoniae NOT DETECTED NOT DETECTED Final   Proteus species NOT DETECTED NOT DETECTED Final   Salmonella species NOT DETECTED NOT DETECTED Final   Serratia marcescens NOT DETECTED NOT DETECTED Final   Haemophilus influenzae NOT DETECTED NOT DETECTED Final   Neisseria meningitidis NOT DETECTED NOT DETECTED Final   Pseudomonas aeruginosa NOT DETECTED NOT DETECTED Final  Stenotrophomonas maltophilia NOT DETECTED NOT DETECTED Final   Candida albicans NOT DETECTED NOT DETECTED Final   Candida auris NOT DETECTED NOT DETECTED Final   Candida glabrata NOT DETECTED NOT DETECTED Final   Candida krusei NOT DETECTED NOT DETECTED Final   Candida parapsilosis NOT DETECTED NOT DETECTED Final   Candida tropicalis NOT DETECTED NOT DETECTED Final   Cryptococcus neoformans/gattii NOT DETECTED NOT DETECTED Final   Methicillin resistance mecA/C DETECTED (A) NOT DETECTED Final    Comment: CRITICAL RESULT CALLED TO, READ BACK BY AND VERIFIED WITH: ALEX CHAPPELL @1033  04/24/24 MJU Performed at [facility], [address] [place], [place] [address]          Radiology Studies: ECHOCARDIOGRAM COMPLETE Result Date: 04/24/2024    ECHOCARDIOGRAM REPORT   Patient Name:   Chawn [name] Lacroix Date of Exam: 04/24/2024 Medical Rec #:  [id number]            Height:       66.0 in Accession #:    [id number]           Weight:       180.0 lb Date of Birth:  August 01, 1941            BSA:          1.912 m Patient Age:    82 years             BP:           115/52 mmHg Patient Gender: M                    HR:           64  bpm. Exam Location:  [facility] Procedure: 2D Echo, Cardiac Doppler and Color Doppler (Both Spectral and Color            Flow Doppler were utilized during procedure). Indications:     Aortic stenosis I35.0  History:         Patient has prior history of Echocardiogram examinations, most                  recent 04/18/2023. Risk Factors:Hypertension, Diabetes and                  Dyslipidemia.  Sonographer:     Broadus Canes Referring Phys:  [id number] CARALYN HUDSON Diagnosing Phys: Sabina Custovic IMPRESSIONS  1. Left ventricular ejection fraction, by estimation, is 60 to 65%. The left ventricle has normal function. The left ventricle has no regional wall motion abnormalities. Left ventricular diastolic parameters are consistent with Grade II diastolic dysfunction (pseudonormalization).  2. Right ventricular systolic function is normal. The right ventricular size is normal.  3. Left atrial size was mildly dilated.  4. The mitral valve is normal in structure. Mild mitral valve regurgitation. No evidence of mitral stenosis.  5. The aortic valve is calcified. Aortic valve regurgitation is not visualized. Mild to moderate aortic valve stenosis. Aortic valve area, by VTI measures 1.20 cm. Aortic valve mean gradient measures 25.3 mmHg. Aortic valve Vmax measures 3.16 m/s.  6. The inferior vena cava is normal in size with greater than 50% respiratory variability, suggesting right atrial pressure of 3 mmHg. FINDINGS  Left Ventricle: Left ventricular ejection fraction, by estimation, is 60 to 65%. The left ventricle has normal function. The left ventricle has no regional wall motion abnormalities. The left ventricular internal cavity size was normal in size. There is  no left ventricular hypertrophy. Left ventricular  diastolic parameters are consistent with Grade II diastolic dysfunction (pseudonormalization). Right Ventricle: The right ventricular size is normal. No increase in right ventricular wall thickness. Right ventricular  systolic function is normal. Left Atrium: Left atrial size was mildly dilated. Right Atrium: Right atrial size was normal in size. Pericardium: There is no evidence of pericardial effusion. Mitral Valve: The mitral valve is normal in structure. Mild mitral valve regurgitation. No evidence of mitral valve stenosis. MV peak gradient, 7.5 mmHg. The mean mitral valve gradient is 3.0 mmHg. Tricuspid Valve: The tricuspid valve is normal in structure. Tricuspid valve regurgitation is trivial. Aortic Valve: The aortic valve is calcified. Aortic valve regurgitation is not visualized. Mild to moderate aortic stenosis is present. Aortic valve mean gradient measures 25.3 mmHg. Aortic valve peak gradient measures 39.9 mmHg. Aortic valve area, by VTI measures 1.20 cm. Pulmonic Valve: The pulmonic valve was normal in structure. Pulmonic valve regurgitation is not visualized. Aorta: The aortic root is normal in size and structure. Venous: The inferior vena cava is normal in size with greater than 50% respiratory variability, suggesting right atrial pressure of 3 mmHg. IAS/Shunts: No atrial level shunt detected by color flow Doppler.  LEFT VENTRICLE PLAX 2D LVIDd:         4.60 cm   Diastology LVIDs:         3.00 cm   LV e' medial:    4.79 cm/s LV PW:         1.00 cm   LV E/e' medial:  16.6 LV IVS:        1.30 cm   LV e' lateral:   6.42 cm/s LVOT diam:     2.20 cm   LV E/e' lateral: 12.4 LV SV:         99 LV SV Index:   52 LVOT Area:     3.80 cm  RIGHT VENTRICLE RV Basal diam:  2.60 cm RV Mid diam:    2.70 cm RV S prime:     13.50 cm/s TAPSE (M-mode): 2.2 cm LEFT ATRIUM             Index        RIGHT ATRIUM          Index LA diam:        4.10 cm 2.14 cm/m   RA Area:     9.12 cm LA Vol (A2C):   70.3 ml 36.76 ml/m  RA Volume:   16.80 ml 8.79 ml/m LA Vol (A4C):   55.6 ml 29.08 ml/m LA Biplane Vol: 62.8 ml 32.84 ml/m  AORTIC VALVE AV Area (Vmax):    1.06 cm AV Area (Vmean):   0.94 cm AV Area (VTI):     1.20 cm AV Vmax:            315.67 cm/s AV Vmean:          240.667 cm/s AV VTI:            0.822 m AV Peak Grad:      39.9 mmHg AV Mean Grad:      25.3 mmHg LVOT Vmax:         87.80 cm/s LVOT Vmean:        59.300 cm/s LVOT VTI:          0.260 m LVOT/AV VTI ratio: 0.32  AORTA Ao Root diam: 3.20 cm MITRAL VALVE                TRICUSPID VALVE MV Area (PHT):  3.06 cm     TR Peak grad:   20.2 mmHg MV Area VTI:   3.32 cm     TR Vmax:        225.00 cm/s MV Peak grad:  7.5 mmHg MV Mean grad:  3.0 mmHg     SHUNTS MV Vmax:       1.37 m/s     Systemic VTI:  0.26 m MV Vmean:      79.0 cm/s    Systemic Diam: 2.20 cm MV Decel Time: 248 msec MV E velocity: 79.70 cm/s MV A velocity: 127.00 cm/s MV E/A ratio:  0.63 Designer, multimedia signed by Isabell Manzanilla Signature Date/Time: 04/24/2024/11:59:28 AM    Final         Scheduled Meds:  arformoterol   15 mcg Nebulization BID   And   umeclidinium bromide   1 puff Inhalation Daily   atorvastatin   80 mg Oral Daily   azelastine   1 spray Each Nare BID   budesonide  (PULMICORT ) nebulizer solution  0.25 mg Nebulization BID   citalopram   10 mg Oral Daily   clopidogrel   75 mg Oral Daily   heparin   5,000 Units Subcutaneous Q12H   insulin  aspart  0-5 Units Subcutaneous QHS   insulin  aspart  0-9 Units Subcutaneous TID WC   insulin  glargine-yfgn  15 Units Subcutaneous QHS   isosorbide  mononitrate  120 mg Oral BID   loratadine   10 mg Oral Daily   metoprolol  succinate  100 mg Oral Daily   montelukast   10 mg Oral QHS   pantoprazole   40 mg Oral BID AC   ranolazine   500 mg Oral BID   torsemide   40 mg Oral BID   traZODone   25 mg Oral QHS   Continuous Infusions:   LOS: 2 days      Alphonsus Jeans, MD [facility] Pager [phone]  If 7PM-7AM, please contact night-coverage [email or web address] 04/25/2024, 8:31 AM

## 2024-04-26 ENCOUNTER — Inpatient Hospital Stay

## 2024-04-26 DIAGNOSIS — I200 Unstable angina: Secondary | ICD-10-CM | POA: Diagnosis not present

## 2024-04-26 LAB — CBC
HCT: 34.9 % — ABNORMAL LOW (ref 39.0–52.0)
Hemoglobin: 11.3 g/dL — ABNORMAL LOW (ref 13.0–17.0)
MCH: 31 pg (ref 26.0–34.0)
MCHC: 32.4 g/dL (ref 30.0–36.0)
MCV: 95.6 fL (ref 80.0–100.0)
Platelets: 264 10*3/uL (ref 150–400)
RBC: 3.65 MIL/uL — ABNORMAL LOW (ref 4.22–5.81)
RDW: 13.2 % (ref 11.5–15.5)
WBC: 11.2 10*3/uL — ABNORMAL HIGH (ref 4.0–10.5)
nRBC: 0 % (ref 0.0–0.2)

## 2024-04-26 LAB — BASIC METABOLIC PANEL WITH GFR
Anion gap: 11 (ref 5–15)
BUN: 78 mg/dL — ABNORMAL HIGH (ref 8–23)
CO2: 22 mmol/L (ref 22–32)
Calcium: 8.8 mg/dL — ABNORMAL LOW (ref 8.9–10.3)
Chloride: 98 mmol/L (ref 98–111)
Creatinine, Ser: 3.04 mg/dL — ABNORMAL HIGH (ref 0.61–1.24)
GFR, Estimated: 20 mL/min — ABNORMAL LOW (ref >60)
Glucose, Bld: 257 mg/dL — ABNORMAL HIGH (ref 70–99)
Potassium: 4.1 mmol/L (ref 3.5–5.1)
Sodium: 131 mmol/L — ABNORMAL LOW (ref 135–145)

## 2024-04-26 LAB — GLUCOSE, CAPILLARY
Glucose-Capillary: 217 mg/dL — ABNORMAL HIGH (ref 70–99)
Glucose-Capillary: 238 mg/dL — ABNORMAL HIGH (ref 70–99)
Glucose-Capillary: 272 mg/dL — ABNORMAL HIGH (ref 70–99)
Glucose-Capillary: 234 mg/dL — ABNORMAL HIGH (ref 70–99)

## 2024-04-26 LAB — LIPOPROTEIN A (LPA): Lipoprotein (a): 177.2 nmol/L — ABNORMAL HIGH (ref <75.0)

## 2024-04-26 MED ORDER — METHOCARBAMOL 500 MG PO TABS
500.0000 mg | ORAL_TABLET | Freq: Two times a day (BID) | ORAL | Status: DC | PRN
  Administered 2024-04-26: 500 mg via ORAL
  Filled 2024-04-26: qty 1

## 2024-04-26 NOTE — Plan of Care (Signed)
  Problem: Education: Goal: Knowledge of General Education information will improve Description: Including pain rating scale, medication(s)/side effects and non-pharmacologic comfort measures Outcome: Progressing   Problem: Health Behavior/Discharge Planning: Goal: Ability to manage health-related needs will improve Outcome: Progressing   Problem: Clinical Measurements: Goal: Ability to maintain clinical measurements within normal limits will improve Outcome: Progressing Goal: Will remain free from infection Outcome: Progressing Goal: Diagnostic test results will improve Outcome: Progressing Goal: Respiratory complications will improve Outcome: Progressing Goal: Cardiovascular complication will be avoided Outcome: Progressing   Problem: Activity: Goal: Risk for activity intolerance will decrease Outcome: Progressing   Problem: Nutrition: Goal: Adequate nutrition will be maintained Outcome: Progressing   Problem: Coping: Goal: Level of anxiety will decrease Outcome: Progressing   Problem: Elimination: Goal: Will not experience complications related to bowel motility Outcome: Progressing Goal: Will not experience complications related to urinary retention Outcome: Progressing   Problem: Pain Managment: Goal: General experience of comfort will improve and/or be controlled Outcome: Progressing   Problem: Safety: Goal: Ability to remain free from injury will improve Outcome: Progressing   Problem: Skin Integrity: Goal: Risk for impaired skin integrity will decrease Outcome: Progressing   Problem: Education: Goal: Ability to describe self-care measures that may prevent or decrease complications (Diabetes Survival Skills Education) will improve Outcome: Progressing Goal: Individualized Educational Video(s) Outcome: Progressing   Problem: Coping: Goal: Ability to adjust to condition or change in health will improve Outcome: Progressing   Problem: Fluid  Volume: Goal: Ability to maintain a balanced intake and output will improve Outcome: Progressing   Problem: Health Behavior/Discharge Planning: Goal: Ability to identify and utilize available resources and services will improve Outcome: Progressing Goal: Ability to manage health-related needs will improve Outcome: Progressing   Problem: Metabolic: Goal: Ability to maintain appropriate glucose levels will improve Outcome: Progressing   Problem: Nutritional: Goal: Maintenance of adequate nutrition will improve Outcome: Progressing Goal: Progress toward achieving an optimal weight will improve Outcome: Progressing   Problem: Skin Integrity: Goal: Risk for impaired skin integrity will decrease Outcome: Progressing   Problem: Tissue Perfusion: Goal: Adequacy of tissue perfusion will improve Outcome: Progressing   Problem: Education: Goal: Understanding of cardiac disease, CV risk reduction, and recovery process will improve Outcome: Progressing Goal: Individualized Educational Video(s) Outcome: Progressing   Problem: Activity: Goal: Ability to tolerate increased activity will improve Outcome: Progressing   Problem: Cardiac: Goal: Ability to achieve and maintain adequate cardiovascular perfusion will improve Outcome: Progressing   Problem: Health Behavior/Discharge Planning: Goal: Ability to safely manage health-related needs after discharge will improve Outcome: Progressing

## 2024-04-26 NOTE — Progress Notes (Addendum)
 PROGRESS NOTE    Mike Decker  UJW:[id number] DOB: [birth year] DOA: 04/23/2024 PCP: Laurence Pons, NP   Assessment & Plan:   Principal Problem:   Chest pain Active Problems:   Angina at rest [facility])  Assessment and Plan: Chest pain: chronic angina as per cardio. W/ intermittent chest pain. Continue on metoprolol , ranexa , imdur  as per cardio. Continue on tele. Cardio cleared pt for d/c. Cardio following and recs apprec   Hemoptysis: CXR shows no active disease. H&H are trending up today    CKDIV: Cr is trending down today. Avoid nephrotoxic meds. 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 No need for a transfusion currently    DM2: poorly controlled. Continue on glargine, SSI w/ accuchecks    Chronic diastolic CHF: appears euvolemic. Continue on metoprolol , torsemide . Monitor I/Os   PAF:  continue on metoprolol . Not on anticoagulation secondary to hx of GI bleed         DVT prophylaxis: heparin   Code Status: full  Family Communication: Disposition Plan: likely d/c back home.  Level of care: Telemetry Cardiac Consultants:  Cardio  Nephro   Procedures: Antimicrobials:    Subjective: Pt c/o chest pain   Objective: Vitals:   04/25/24 1700 04/25/24 2003 04/26/24 0400 04/26/24 0827  BP:  (!) 120/51 (!) 114/47 111/61  Pulse:  (!) 59 61 68  Resp:  18 20 18   Temp:  97.9 F (36.6 C) 98.1 F (36.7 C) 98 F (36.7 C)  TempSrc:  Oral  Oral  SpO2: 98% 95% 98% 95%  Weight:      Height:        Intake/Output Summary (Last 24 hours) at 04/26/2024 0835 Last data filed at 04/26/2024 0600 Gross per 24 hour  Intake 920 ml  Output 1700 ml  Net -780 ml   Filed Weights   04/23/24 0331  Weight: 81.6 kg    Examination:  General exam: Appears calm & comfortable  Respiratory system: diminished breath sounds b/l  Cardiovascular system: S1 & S2+. No rubs or clicks  Gastrointestinal system: Abd is soft, NT, ND & hypoactive bowel sounds   Central nervous system: alert & oriented. Moves all extremities  Psychiatry: Judgement and insight appears at baseline. Flat mood and affect    Data Reviewed: I have personally reviewed following labs and imaging studies  CBC: Recent Labs  Lab 04/23/24 0328 04/24/24 0541 04/25/24 0533  WBC 9.1 7.1 8.3  NEUTROABS 6.5  --   --   HGB 12.1* 10.6* 10.3*  HCT 36.0* 32.7* 31.0*  MCV 94.7 95.9 94.5  PLT 274 247 274   Basic Metabolic Panel: Recent Labs  Lab 04/23/24 0328 04/24/24 0541 04/25/24 0533  NA 134* 138 137  K 4.2 4.1 3.8  CL 99 103 102  CO2 23 25 23   GLUCOSE 250* 133* 154*  BUN 61* 57* 66*  CREATININE 2.92* 2.83* 3.08*  CALCIUM  8.6* 9.0 8.8*  MG  --  2.2  --    GFR: Estimated Creatinine Clearance: 18.2 mL/min (A) (by C-G formula based on SCr of 3.08 mg/dL (H)). Liver Function Tests: Recent Labs  Lab 04/23/24 0328  AST 19  ALT 16  ALKPHOS 51  BILITOT 0.7  PROT 6.8  ALBUMIN  3.6   Recent Labs  Lab 04/23/24 0513  LIPASE 39   No results for input(s): "AMMONIA" in the last 168 hours. Coagulation Profile: No results for input(s): "INR", "PROTIME" in the last 168 hours. Cardiac Enzymes: No results  for input(s): "CKTOTAL", "CKMB", "CKMBINDEX", "TROPONINI" in the last 168 hours. BNP (last 3 results) No results for input(s): "PROBNP" in the last 8760 hours. HbA1C: No results for input(s): "HGBA1C" in the last 72 hours. CBG: Recent Labs  Lab 04/25/24 0849 04/25/24 1229 04/25/24 1702 04/25/24 2054 04/26/24 0827  GLUCAP 194* 156* 269* 262* 217*   Lipid Profile: No results for input(s): "CHOL", "HDL", "LDLCALC", "TRIG", "CHOLHDL", "LDLDIRECT" in the last 72 hours.  Thyroid  Function Tests: No results for input(s): "TSH", "T4TOTAL", "FREET4", "T3FREE", "THYROIDAB" in the last 72 hours. Anemia Panel: No results for input(s): "VITAMINB12", "FOLATE", "FERRITIN", "TIBC", "IRON ", "RETICCTPCT" in the last 72 hours. Sepsis Labs: Recent Labs  Lab  04/23/24 0328 04/23/24 0450  LATICACIDVEN 2.0* 1.5    Recent Results (from the past 240 hours)  Culture, blood (routine x 2)     Status: None (Preliminary result)   Collection Time: 04/23/24  4:50 AM   Specimen: BLOOD RIGHT ARM  Result Value Ref Range Status   Specimen Description BLOOD RIGHT ARM  Final   Special Requests   Final    BOTTLES DRAWN AEROBIC AND ANAEROBIC Blood Culture adequate volume   Culture   Final    NO GROWTH 3 DAYS Performed at [facility], [address], [place], [place] [address]    Report Status PENDING  Incomplete  Culture, blood (routine x 2)     Status: Abnormal   Collection Time: 04/23/24  4:50 AM   Specimen: BLOOD LEFT ARM  Result Value Ref Range Status   Specimen Description   Final    BLOOD LEFT ARM Performed at [facility], [address], [place], [place] [address]    Special Requests   Final    BOTTLES DRAWN AEROBIC AND ANAEROBIC Blood Culture adequate volume Performed at [facility], [address] [place], [place] [address]    Culture  Setup Time   Final    GRAM POSITIVE COCCI AEROBIC BOTTLE ONLY Organism ID to follow CRITICAL RESULT CALLED TO, READ BACK BY AND VERIFIED WITH: ALEX CHAPPELL @1033  04/24/24 MJU Performed at [facility], [address] [place], [place] [address]    Culture (A)  Final    STAPHYLOCOCCUS EPIDERMIDIS THE SIGNIFICANCE OF ISOLATING THIS ORGANISM FROM A SINGLE SET OF BLOOD CULTURES WHEN MULTIPLE SETS ARE DRAWN IS UNCERTAIN. PLEASE NOTIFY THE MICROBIOLOGY DEPARTMENT WITHIN ONE WEEK IF SPECIATION AND SENSITIVITIES ARE REQUIRED. Performed at [facility], [address] [place], [place] [address]    Report Status 04/25/2024 FINAL  Final  Resp panel by RT-PCR (RSV, Flu A&B, Covid) Urine, Clean Catch     Status: None   Collection Time: 04/23/24  4:50 AM   Specimen: Urine, Clean Catch; Nasal Swab  Result Value Ref Range Status   SARS Coronavirus 2 by RT PCR NEGATIVE  NEGATIVE Final    Comment: (NOTE) SARS-CoV-2 target nucleic acids are NOT DETECTED.  The SARS-CoV-2 RNA is generally detectable in upper respiratory specimens during the acute phase of infection. The lowest concentration of SARS-CoV-2 viral copies this assay can detect is 138 copies/mL. A negative result does not preclude SARS-Cov-2 infection and should not be used as the sole basis for treatment or other patient management decisions. A negative result may occur with  improper specimen collection/handling, submission of specimen other than nasopharyngeal swab, presence of viral mutation(s) within the areas targeted by this assay, and inadequate number of viral copies(<138 copies/mL). A negative result must be combined with  clinical observations, patient history, and epidemiological information. The expected result is Negative.  Fact Sheet for Patients:  BloggerCourse.com  Fact Sheet for Healthcare Providers:  SeriousBroker.it  This test is no t yet approved or cleared by the [place]  FDA and  has been authorized for detection and/or diagnosis of SARS-CoV-2 by FDA under an Emergency Use Authorization (EUA). This EUA will remain  in effect (meaning this test can be used) for the duration of the COVID-19 declaration under Section 564(b)(1) of the Act, 21 U.S.C.section 360bbb-3(b)(1), unless the authorization is terminated  or revoked sooner.       Influenza A by PCR NEGATIVE NEGATIVE Final   Influenza B by PCR NEGATIVE NEGATIVE Final    Comment: (NOTE) The Xpert Xpress SARS-CoV-2/FLU/RSV plus assay is intended as an aid in the diagnosis of influenza from Nasopharyngeal swab specimens and should not be used as a sole basis for treatment. Nasal washings and aspirates are unacceptable for Xpert Xpress SARS-CoV-2/FLU/RSV testing.  Fact Sheet for Patients: BloggerCourse.com  Fact Sheet for Healthcare  Providers: SeriousBroker.it  This test is not yet approved or cleared by the [place]  FDA and has been authorized for detection and/or diagnosis of SARS-CoV-2 by FDA under an Emergency Use Authorization (EUA). This EUA will remain in effect (meaning this test can be used) for the duration of the COVID-19 declaration under Section 564(b)(1) of the Act, 21 U.S.C. section 360bbb-3(b)(1), unless the authorization is terminated or revoked.     Resp Syncytial Virus by PCR NEGATIVE NEGATIVE Final    Comment: (NOTE) Fact Sheet for Patients: BloggerCourse.com  Fact Sheet for Healthcare Providers: SeriousBroker.it  This test is not yet approved or cleared by the [place]  FDA and has been authorized for detection and/or diagnosis of SARS-CoV-2 by FDA under an Emergency Use Authorization (EUA). This EUA will remain in effect (meaning this test can be used) for the duration of the COVID-19 declaration under Section 564(b)(1) of the Act, 21 U.S.C. section 360bbb-3(b)(1), unless the authorization is terminated or revoked.  Performed at [facility], [address] [place], [place] [address]   Blood Culture ID Panel (Reflexed)     Status: Abnormal   Collection Time: 04/23/24  4:50 AM  Result Value Ref Range Status   Enterococcus faecalis NOT DETECTED NOT DETECTED Final   Enterococcus Faecium NOT DETECTED NOT DETECTED Final   Listeria monocytogenes NOT DETECTED NOT DETECTED Final   Staphylococcus species DETECTED (A) NOT DETECTED Final    Comment: CRITICAL RESULT CALLED TO, READ BACK BY AND VERIFIED WITH: ALEX CHAPPELL @1033  04/24/24 MJU    Staphylococcus aureus (BCID) NOT DETECTED NOT DETECTED Final   Staphylococcus epidermidis DETECTED (A) NOT DETECTED Final    Comment: Methicillin (oxacillin) resistant coagulase negative staphylococcus. Possible blood culture contaminant (unless isolated from  more than one blood culture draw or clinical case suggests pathogenicity). No antibiotic treatment is indicated for blood  culture contaminants. CRITICAL RESULT CALLED TO, READ BACK BY AND VERIFIED WITH: ALEX CHAPPELL @1033  04/24/24 MJU    Staphylococcus lugdunensis NOT DETECTED NOT DETECTED Final   Streptococcus species NOT DETECTED NOT DETECTED Final   Streptococcus agalactiae NOT DETECTED NOT DETECTED Final   Streptococcus pneumoniae NOT DETECTED NOT DETECTED Final   Streptococcus pyogenes NOT DETECTED NOT DETECTED Final   A.calcoaceticus-baumannii NOT DETECTED NOT DETECTED Final   Bacteroides fragilis NOT DETECTED NOT DETECTED Final   Enterobacterales NOT DETECTED NOT DETECTED Final   Enterobacter cloacae complex NOT DETECTED NOT DETECTED Final   Escherichia  coli NOT DETECTED NOT DETECTED Final   Klebsiella aerogenes NOT DETECTED NOT DETECTED Final   Klebsiella oxytoca NOT DETECTED NOT DETECTED Final   Klebsiella pneumoniae NOT DETECTED NOT DETECTED Final   Proteus species NOT DETECTED NOT DETECTED Final   Salmonella species NOT DETECTED NOT DETECTED Final   Serratia marcescens NOT DETECTED NOT DETECTED Final   Haemophilus influenzae NOT DETECTED NOT DETECTED Final   Neisseria meningitidis NOT DETECTED NOT DETECTED Final   Pseudomonas aeruginosa NOT DETECTED NOT DETECTED Final   Stenotrophomonas maltophilia NOT DETECTED NOT DETECTED Final   Candida albicans NOT DETECTED NOT DETECTED Final   Candida auris NOT DETECTED NOT DETECTED Final   Candida glabrata NOT DETECTED NOT DETECTED Final   Candida krusei NOT DETECTED NOT DETECTED Final   Candida parapsilosis NOT DETECTED NOT DETECTED Final   Candida tropicalis NOT DETECTED NOT DETECTED Final   Cryptococcus neoformans/gattii NOT DETECTED NOT DETECTED Final   Methicillin resistance mecA/C DETECTED (A) NOT DETECTED Final    Comment: CRITICAL RESULT CALLED TO, READ BACK BY AND VERIFIED WITH: ALEX CHAPPELL @1033  04/24/24 MJU Performed  at [facility], [address], [place], [place] [address]          Radiology Studies: No results found.       Scheduled Meds:  arformoterol   15 mcg Nebulization BID   And   umeclidinium bromide   1 puff Inhalation Daily   atorvastatin   80 mg Oral Daily   azelastine   1 spray Each Nare BID   budesonide  (PULMICORT ) nebulizer solution  0.25 mg Nebulization BID   citalopram   10 mg Oral Daily   clopidogrel   75 mg Oral Daily   heparin   5,000 Units Subcutaneous Q12H   insulin  aspart  0-5 Units Subcutaneous QHS   insulin  aspart  0-9 Units Subcutaneous TID WC   insulin  glargine-yfgn  15 Units Subcutaneous QHS   isosorbide  mononitrate  120 mg Oral BID   loratadine   10 mg Oral Daily   metoprolol  succinate  100 mg Oral Daily   montelukast   10 mg Oral QHS   pantoprazole   40 mg Oral BID AC   ranolazine   500 mg Oral BID   torsemide   40 mg Oral Daily   traZODone   25 mg Oral QHS   Continuous Infusions:   LOS: 3 days      Alphonsus Jeans, MD [facility] Pager [phone]  If 7PM-7AM, please contact night-coverage [email or web address] 04/26/2024, 8:35 AM

## 2024-04-26 NOTE — Progress Notes (Signed)
 [facility] CONSULT NOTE    Date: 04/26/2024                  Patient Name:  Mike Decker  MRN: [id number]  DOB: 05/06/1941  Age / Sex: 83 y.o., male         PCP: Laurence Pons, NP                 Service Requesting Consult: Medicine                  Reason for Consult: Acute kidney injury            History of Present Illness: Patient is a 83 y.o. male with a PMHx of diabetes, peripheral vascular disease, coronary artery disease, congestive heart failure, hypertension, chronic kidney disease stage IV with a GFR of around 20 cc/min now admitted with history of chest pain along with some shortness of breath.  Patient is now on 40 mg of torsemide  daily.  Medications: Outpatient medications: Medications Prior to Admission  Medication Sig Dispense Refill Last Dose/Taking   acetaminophen  (TYLENOL ) 500 MG tablet Take 1,000 mg by mouth every 8 (eight) hours as needed for mild pain.   Taking As Needed   atorvastatin  (LIPITOR ) 80 MG tablet Take 1 tablet (80 mg total) by mouth daily. 30 tablet 0 04/22/2024   azelastine  (ASTELIN ) 0.1 % nasal spray USE 1 TO 2 SPRAYS IN EACH NOSTRIL TWICE A DAY   04/22/2024   calcitRIOL  (ROCALTROL ) 0.25 MCG capsule Take 0.25 mcg by mouth daily.   04/22/2024   cetirizine (ZYRTEC) 5 MG tablet Take 5 mg by mouth daily.   Taking   citalopram  (CELEXA ) 10 MG tablet Take 1 tablet (10 mg total) by mouth daily. 90 tablet 3 04/22/2024   clopidogrel  (PLAVIX ) 75 MG tablet TAKE 1 TABLET BY MOUTH EVERY DAY 90 tablet 1 04/22/2024   [employer] VITAMIN B12 1000 MCG tablet TAKE 1 TABLET BY MOUTH EVERY DAY 30 tablet 0 04/22/2024   docusate sodium  (COLACE) 100 MG capsule Take 100 mg by mouth 2 (two) times daily.   04/22/2024   EPINEPHrine  (EPI-PEN) 0.3 mg/0.3 mL SOAJ injection Inject into the muscle as needed (anaphylaxis).   Taking As Needed   ergocalciferol  (VITAMIN D2) 1.25 MG (50000 UT) capsule Take 50,000 Units by mouth every 30 (thirty) days.   04/01/2024    ferrous sulfate  325 (65 FE) MG EC tablet TAKE 1 TABLET BY MOUTH EVERY DAY 90 tablet 3 04/22/2024   fluticasone  (FLONASE ) 50 MCG/ACT nasal spray USE 1 SPRAY IN EACH NOSTRIL EVERY DAY 32 g 6 Past Week   hydrALAZINE  (APRESOLINE ) 50 MG tablet Take 25 mg by mouth 2 (two) times daily.   Taking   insulin  aspart (NOVOLOG ) 100 UNIT/ML FlexPen 14 UNITS IN AM, 30 UNITS AT LUNCH, 30 UNITS AT [facility]   04/22/2024   insulin  glargine (LANTUS ) 100 UNIT/ML injection Inject 0.35 mLs (35 Units total) into the skin at bedtime. This is a decrease from your previous 45 units nightly. (Patient taking differently: Inject 60 Units into the skin at bedtime.) 10 mL 11 04/22/2024   isosorbide  mononitrate (IMDUR ) 120 MG 24 hr tablet Take 1 tablet (120 mg total) by mouth 2 (two) times daily. 60 tablet 0 04/22/2024   JARDIANCE 10 MG TABS tablet Take 10 mg by mouth daily.   04/22/2024   magnesium  oxide (MAG-OX) 400 (240 Mg) MG tablet Take 1 tablet by mouth daily.   04/22/2024  metoprolol  succinate (TOPROL -XL) 50 MG 24 hr tablet Take 1 tablet by mouth daily.   04/22/2024   mometasone  (ASMANEX ) 220 MCG/ACT inhaler Inhale 2 puffs into the lungs daily.   04/22/2024   montelukast  (SINGULAIR ) 10 MG tablet TAKE 1 TABLET AT BEDTIME 90 tablet 3 04/22/2024   nitroGLYCERIN  (NITRODUR - DOSED IN MG/24 HR) 0.4 mg/hr patch Place 0.4 mg onto the skin daily.   04/22/2024   nitroGLYCERIN  (NITROSTAT ) 0.4 MG SL tablet Place 0.4 mg under the tongue every 5 (five) minutes x 3 doses as needed for chest pain.   Taking As Needed   pantoprazole  (PROTONIX ) 40 MG tablet Take 1 tablet (40 mg total) by mouth 2 (two) times daily before a meal.   04/22/2024   ranolazine  (RANEXA ) 500 MG 12 hr tablet Take 1 tablet by mouth 2 (two) times daily.   04/22/2024   Tiotropium Bromide-Olodaterol (STIOLTO RESPIMAT) 2.5-2.5 MCG/ACT AERS Inhale 1 each into the lungs daily.   04/22/2024   torsemide  (DEMADEX ) 20 MG tablet Take 40 mg by mouth 2 (two) times daily.   04/22/2024   folic acid   (FOLVITE ) 400 MCG tablet Take 400 mcg by mouth daily. (Patient not taking: Reported on 04/23/2024)   Not Taking   sodium fluoride  (DENTA 5000 PLUS) 1.1 % CREA dental cream See admin instructions.       Discontinued Meds:   Medications Discontinued During This Encounter  Medication Reason   lactated ringers  bolus 1,000 mL    lactated ringers  bolus 500 mL    vancomycin  (VANCOCIN ) IVPB 1000 mg/200 mL premix    metoprolol  succinate (TOPROL -XL) 24 hr tablet 50 mg    ranolazine  (RANEXA ) 1000 MG SR tablet Change in therapy   sodium fluoride  (PREVIDENT 5000 PLUS) 1.1 % dental cream Not available   insulin  glargine-yfgn (SEMGLEE ) injection 20 Units    insulin  aspart (novoLOG ) injection 8 Units    mometasone  (ASMANEX ) inhaler 2 puff P&T Policy: Therapeutic Substitute   metoprolol  succinate (TOPROL -XL) 24 hr tablet 75 mg    torsemide  (DEMADEX ) tablet 40 mg     Current medications: Current Facility-Administered Medications  Medication Dose Route Frequency Provider Last Rate Last Admin   acetaminophen  (TYLENOL ) tablet 1,000 mg  1,000 mg Oral Q8H PRN Antoniette Batty T, MD   1,000 mg at 04/26/24 0402   arformoterol  (BROVANA ) nebulizer solution 15 mcg  15 mcg Nebulization BID Antoniette Batty T, MD   15 mcg at 04/26/24 0746   And   umeclidinium bromide  (INCRUSE ELLIPTA ) 62.5 MCG/ACT 1 puff  1 puff Inhalation Daily Antoniette Batty T, MD   1 puff at 04/26/24 7829   atorvastatin  (LIPITOR ) tablet 80 mg  80 mg Oral Daily Zhang, Ping T, MD   80 mg at 04/26/24 5621   azelastine  (ASTELIN ) 0.1 % nasal spray 1 spray  1 spray Each Nare BID Antoniette Batty T, MD   1 spray at 04/26/24 0908   budesonide  (PULMICORT ) nebulizer solution 0.25 mg  0.25 mg Nebulization BID Ramonita Burow, RPH   0.25 mg at 04/26/24 3086   citalopram  (CELEXA ) tablet 10 mg  10 mg Oral Daily Zhang, Ping T, MD   10 mg at 04/26/24 5784   clopidogrel  (PLAVIX ) tablet 75 mg  75 mg Oral Daily Zhang, Ping T, MD   75 mg at 04/26/24 6962   fluticasone  (FLONASE )  50 MCG/ACT nasal spray 1 spray  1 spray Each Nare Daily PRN [place], MD       heparin  injection 5,000  Units  5,000 Units Subcutaneous Q12H Antoniette Batty T, MD   5,000 Units at 04/26/24 1610   HYDROmorphone  (DILAUDID ) injection 0.5 mg  0.5 mg Intravenous Q4H PRN [place], MD       insulin  aspart (novoLOG ) injection 0-5 Units  0-5 Units Subcutaneous QHS Antoniette Batty T, MD   3 Units at 04/25/24 2322   insulin  aspart (novoLOG ) injection 0-9 Units  0-9 Units Subcutaneous TID WC [place], MD   3 Units at 04/26/24 1204   insulin  glargine-yfgn (SEMGLEE ) injection 15 Units  15 Units Subcutaneous QHS Alphonsus Jeans, MD   15 Units at 04/25/24 2322   isosorbide  mononitrate (IMDUR ) 24 hr tablet 120 mg  120 mg Oral BID Zhang, Ping T, MD   120 mg at 04/26/24 9604   loratadine  (CLARITIN ) tablet 10 mg  10 mg Oral Daily Zhang, Ping T, MD   10 mg at 04/26/24 5409   methocarbamol  (ROBAXIN ) tablet 500 mg  500 mg Oral BID PRN Alphonsus Jeans, MD   500 mg at 04/26/24 1204   metoprolol  succinate (TOPROL -XL) 24 hr tablet 100 mg  100 mg Oral Daily Decoste, Gabriella, PA-C   100 mg at 04/26/24 8119   montelukast  (SINGULAIR ) tablet 10 mg  10 mg Oral QHS Antoniette Batty T, MD   10 mg at 04/25/24 2323   nitroGLYCERIN  (NITROGLYN) 2 % ointment 0.5 inch  0.5 inch Topical Q6H PRN Decoste, Gabriella, PA-C   0.5 inch at 04/24/24 1958   nitroGLYCERIN  (NITROSTAT ) SL tablet 0.4 mg  0.4 mg Sublingual Q5 Min x 3 PRN Antoniette Batty T, MD   0.4 mg at 04/25/24 0919   ondansetron  (ZOFRAN ) injection 4 mg  4 mg Intravenous Q6H PRN [place], MD       Oral care mouth rinse  15 mL Mouth Rinse PRN Donaciano Frizzle, MD       pantoprazole  (PROTONIX ) EC tablet 40 mg  40 mg Oral BID AC Zhang, Ping T, MD   40 mg at 04/26/24 1478   ranolazine  (RANEXA ) 12 hr tablet 500 mg  500 mg Oral BID Antoniette Batty T, MD   500 mg at 04/26/24 2956   torsemide  (DEMADEX ) tablet 40 mg  40 mg Oral Daily Alphonsus Jeans, MD   40 mg at 04/26/24 2130    traZODone  (DESYREL ) tablet 25 mg  25 mg Oral QHS [place], MD   25 mg at 04/25/24 2321      Allergies: Allergies  Allergen Reactions   Gabapentin      Other reaction(s): Other (See Comments) Tremors   Peanut-Containing Drug Products Anaphylaxis   Penicillins Hives and Rash    Per notes - patient tolerated cefdinir in 2022    Bee Venom Swelling   Influenza Vaccines Hives   Inh [Isoniazid ] Hives   Jardiance [Empagliflozin] Other (See Comments)    Diarrhea   Kenalog [Triamcinolone Acetonide] Hives   Levaquin  [Levofloxacin ] Other (See Comments)    Tendon, ligament pain.    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, esphageal   Chronic diastolic CHF (congestive heart failure) (HCC)    Colon polyp    Diabetes mellitus without complication (HCC)    Diverticulosis    Gout    Heart disease    Hemangioma    liver   Hyperlipidemia    Hypertension    Myocardial infarct [facility])    Ocular hypertension    Peripheral vascular disease (HCC)    Skin cancer    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HCC)    Sleep apnea    Vitreoretinal degeneration      Past Surgical History: Past Surgical History:  Procedure Laterality Date   CATARACT EXTRACTION  2011, 2012   COLONOSCOPY N/A 12/14/2018   Procedure: COLONOSCOPY;  Surgeon: Irby Mannan, MD;  Location: [facility] ENDOSCOPY;  Service: Endoscopy;  Laterality: N/A;   CORONARY ANGIOPLASTY WITH STENT PLACEMENT  12/10/2010   CORONARY STENT INTERVENTION N/A 12/29/2019   Procedure: CORONARY STENT INTERVENTION;  Surgeon: Sammy Crisp, MD;  Location: [facility] INVASIVE CV LAB;  Service: Cardiovascular;  Laterality: N/A;    ESOPHAGOGASTRODUODENOSCOPY N/A 12/14/2018   Procedure: ESOPHAGOGASTRODUODENOSCOPY (EGD);  Surgeon: Irby Mannan, MD;  Location: [facility] ENDOSCOPY;  Service: Endoscopy;  Laterality: N/A;   ESOPHAGOGASTRODUODENOSCOPY (EGD) WITH PROPOFOL  N/A 06/01/2016   Procedure: ESOPHAGOGASTRODUODENOSCOPY (EGD) WITH PROPOFOL ;  Surgeon: Cassie Click, MD;  Location: [facility] ENDOSCOPY;  Service: Endoscopy;  Laterality: N/A;   ESOPHAGOGASTRODUODENOSCOPY (EGD) WITH PROPOFOL  N/A 01/31/2023   Procedure: ESOPHAGOGASTRODUODENOSCOPY (EGD) WITH PROPOFOL ;  Surgeon: Marnee Sink, MD;  Location: [facility] ENDOSCOPY;  Service: Endoscopy;  Laterality: N/A;   FEMORAL ARTERY STENT Right    HERNIA REPAIR     x2   INTRAOCULAR LENS INSERTION     LEFT HEART CATH AND CORONARY ANGIOGRAPHY N/A 05/09/2017   Procedure: Left Heart Cath and Coronary Angiography;  Surgeon: Antonette Batters, MD;  Location: [facility] INVASIVE CV LAB;  Service: Cardiovascular;  Laterality: N/A;   LEFT HEART CATH AND CORONARY ANGIOGRAPHY N/A 12/08/2018   Procedure: LEFT HEART CATH AND CORONARY ANGIOGRAPHY and possible PCI and stent;  Surgeon: Antonette Batters, MD;  Location: [facility] INVASIVE CV LAB;  Service: Cardiovascular;  Laterality: N/A;   LEFT HEART CATH AND CORONARY ANGIOGRAPHY N/A 12/29/2019   Procedure: LEFT HEART CATH AND CORONARY ANGIOGRAPHY;  Surgeon: Sammy Crisp, MD;  Location: [facility] INVASIVE CV LAB;  Service: Cardiovascular;  Laterality: N/A;   LEFT HEART CATH AND CORONARY ANGIOGRAPHY Left 12/13/2022   Procedure: LEFT HEART CATH AND CORONARY ANGIOGRAPHY;  Surgeon: Antonette Batters, MD;  Location: [facility] INVASIVE CV LAB;  Service: Cardiovascular;  Laterality: Left;   NOSE SURGERY     submucous resection   PROSTATE SURGERY  12/10/2000   ROTATOR CUFF REPAIR Right      Family History: Family History  Problem Relation Age of Onset   Arthritis Mother    Stroke Maternal Grandfather    Breast cancer Neg Hx      Social History: Social History    Socioeconomic History   Marital status: Married    Spouse name: Not on file   Number of children: 2   Years of education: College 3 years   Highest education level: Associate degree: occupational, Scientist, product/process development, or vocational program  Occupational History   Occupation: retired  Tobacco Use   Smoking status: Former    Current packs/day: 0.00    Types: Cigarettes    Start date: 12/10/1956    Quit date: 12/10/1976    Years since quitting: 47.4   Smokeless tobacco: Never  Vaping Use  Vaping status: Never Used  Substance and Sexual Activity   Alcohol use: No   Drug use: No   Sexual activity: Not Currently  Other Topics Concern   Not on file  Social History Narrative   Not on file   Social Drivers of Health   Financial Resource Strain: Low Risk  (06/21/2023)   Received from [facility]   Overall Financial Resource Strain (CARDIA)    Difficulty of Paying Living Expenses: Not very hard  Food Insecurity: No Food Insecurity (04/23/2024)   Hunger Vital Sign    Worried About Running Out of Food in the Last Year: Never true    Ran Out of Food in the Last Year: Never true  Transportation Needs: No Transportation Needs (04/23/2024)   PRAPARE - Administrator, Civil Service (Medical): No    Lack of Transportation (Non-Medical): No  Physical Activity: Insufficiently Active (09/15/2021)   Exercise Vital Sign    Days of Exercise per Week: 7 days    Minutes of Exercise per Session: 20 min  Stress: No Stress Concern Present (09/15/2021)   [employer] of Occupational Health - Occupational Stress Questionnaire    Feeling of Stress : Not at all  Social Connections: Moderately Isolated (04/23/2024)   Social Connection and Isolation Panel [NHANES]    Frequency of Communication with Friends and Family: Once a week    Frequency of Social Gatherings with Friends and Family: Once a week    Attends Religious Services: More than 4 times per year    Active Member of  [employer] or Organizations: No    Attends Banker Meetings: Never    Marital Status: Married  Catering manager Violence: Not At Risk (04/23/2024)   Humiliation, Afraid, Rape, and Kick questionnaire    Fear of Current or Ex-Partner: No    Emotionally Abused: No    Physically Abused: No    Sexually Abused: No     Review of Systems: As per HPI  Vital Signs: Blood pressure (!) 126/46, pulse (!) 57, temperature 98.7 F (37.1 C), temperature source Oral, resp. rate 17, height 5\' 6"  (1.676 m), weight 81.6 kg, SpO2 97%.  Weight trends: Filed Weights   04/23/24 0331  Weight: 81.6 kg    Physical Exam: Physical Exam: General:  No acute distress  Head:  Normocephalic, atraumatic. Moist oral mucosal membranes  Eyes:  Anicteric  Neck:  Supple  Lungs:   Clear to auscultation, normal effort  Heart:  S1S2 no rubs  Abdomen:   Soft, nontender, bowel sounds present  Extremities:  peripheral edema.  Neurologic:  Awake, alert, following commands  Skin:  No lesions  Access:     Lab results:  Basic Metabolic Panel: Recent Labs  Lab 04/23/24 0328 04/24/24 0541 04/25/24 0533 04/26/24 0936  NA 134* 138 137 131*  K 4.2 4.1 3.8 4.1  CL 99 103 102 98  CO2 23 25 23 22   GLUCOSE 250* 133* 154* 257*  BUN 61* 57* 66* 78*  CREATININE 2.92* 2.83* 3.08* 3.04*  CALCIUM  8.6* 9.0 8.8* 8.8*  MG  --  2.2  --   --     Creatinine  Date/Time Value Ref Range Status  11/03/2014 07:11 AM 1.68 (H) 0.60 - 1.30 mg/dL Final   Creat  Date/Time Value Ref Range Status  06/28/2021 10:13 AM 2.35 (H) 0.70 - 1.22 mg/dL Final   Creatinine, Ser  Date/Time Value Ref Range Status  04/26/2024 09:36 AM 3.04 (H) 0.61 -  1.24 mg/dL Final  86/57/8469 62:95 AM 3.08 (H) 0.61 - 1.24 mg/dL Final  28/41/3244 01:02 AM 2.83 (H) 0.61 - 1.24 mg/dL Final  72/53/6644 03:47 AM 2.92 (H) 0.61 - 1.24 mg/dL Final  42/59/5638 75:64 AM 3.25 (H) 0.61 - 1.24 mg/dL Final  33/29/5188 41:66 AM 3.20 (H) 0.61 - 1.24 mg/dL Final   06/08/1600 09:32 AM 2.87 (H) 0.61 - 1.24 mg/dL Final  35/57/3220 25:42 AM 3.09 (H) 0.61 - 1.24 mg/dL Final  70/62/3762 83:15 AM 3.57 (H) 0.61 - 1.24 mg/dL Final  17/61/6073 71:06 AM 4.03 (H) 0.61 - 1.24 mg/dL Final  26/94/8546 27:03 AM 2.87 (H) 0.61 - 1.24 mg/dL Final  50/08/3817 29:93 AM 2.43 (H) 0.61 - 1.24 mg/dL Final  71/69/6789 38:10 AM 2.57 (H) 0.61 - 1.24 mg/dL Final  17/51/0258 52:77 AM 3.02 (H) 0.61 - 1.24 mg/dL Final  82/42/3536 14:43 AM 3.39 (H) 0.61 - 1.24 mg/dL Final  15/40/0867 61:95 AM 3.09 (H) 0.61 - 1.24 mg/dL Final  09/32/6712 45:80 AM 3.64 (H) 0.61 - 1.24 mg/dL Final  99/83/3825 05:39 AM 3.74 (H) 0.61 - 1.24 mg/dL Final  76/73/4193 79:02 PM 2.56 (H) 0.61 - 1.24 mg/dL Final  40/97/3532 99:24 AM 2.90 (H) 0.61 - 1.24 mg/dL Final  26/83/4196 22:29 AM 2.33 (H) 0.61 - 1.24 mg/dL Final  79/89/2119 41:74 AM 2.32 (H) 0.61 - 1.24 mg/dL Final  07/23/4817 56:31 AM 3.57 (H) 0.61 - 1.24 mg/dL Final  49/70/2637 85:88 PM 2.04 (H) 0.61 - 1.24 mg/dL Final  50/27/7412 87:86 PM 2.83 (H) 0.61 - 1.24 mg/dL Final  76/72/0947 09:62 AM 1.91 (H) 0.61 - 1.24 mg/dL Final  83/66/2947 65:46 PM 1.93 (H) 0.61 - 1.24 mg/dL Final  50/35/4656 81:27 AM 1.48 (H) 0.61 - 1.24 mg/dL Final  51/70/0174 94:49 AM 1.67 (H) 0.61 - 1.24 mg/dL Final  67/59/1638 46:65 PM 1.74 (H) 0.61 - 1.24 mg/dL Final  99/35/7017 79:39 PM 1.98 (H) 0.61 - 1.24 mg/dL Final  03/00/9233 00:76 PM 1.80 (H) 0.61 - 1.24 mg/dL Final  22/63/3354 56:25 AM 1.82 (H) 0.61 - 1.24 mg/dL Final  63/89/3734 28:76 AM 1.60 (H) 0.61 - 1.24 mg/dL Final  81/15/7262 03:55 AM 2.01 (H) 0.61 - 1.24 mg/dL Final  97/41/6384 53:64 AM 2.19 (H) 0.61 - 1.24 mg/dL Final  68/02/2121 48:25 AM 1.99 (H) 0.61 - 1.24 mg/dL Final  00/37/0488 89:16 AM 1.66 (H) 0.61 - 1.24 mg/dL Final  94/50/3888 28:00 AM 1.92 (H) 0.61 - 1.24 mg/dL Final  34/91/7915 05:69 AM 2.61 (H) 0.61 - 1.24 mg/dL Final  79/48/0165 53:74 AM 2.66 (H) 0.61 - 1.24 mg/dL Final  82/70/7867 54:49 AM  2.40 (H) 0.61 - 1.24 mg/dL Final  20/09/711 19:75 AM 2.17 (H) 0.61 - 1.24 mg/dL Final  88/32/5498 26:41 AM 2.28 (H) 0.61 - 1.24 mg/dL Final  58/30/9407 68:08 AM 2.11 (H) 0.61 - 1.24 mg/dL Final  81/09/3158 45:85 AM 2.35 (H) 0.61 - 1.24 mg/dL Final  92/92/4462 86:38 AM 2.45 (H) 0.61 - 1.24 mg/dL Final  17/71/1657 90:38 AM 2.00 (H) 0.61 - 1.24 mg/dL Final  33/38/3291 91:66 PM 1.96 (H) 0.61 - 1.24 mg/dL Final  06/00/4599 77:41 AM 1.96 (H) 0.61 - 1.24 mg/dL Final  42/39/5320 23:34 AM 1.85 (H) 0.61 - 1.24 mg/dL Final  35/68/6168 37:29 AM 1.87 (H) 0.61 - 1.24 mg/dL Final    CBC: Recent Labs  Lab 04/23/24 0328 04/24/24 0541 04/25/24 0533 04/26/24 0936  WBC 9.1 7.1 8.3 11.2*  NEUTROABS 6.5  --   --   --  HGB 12.1* 10.6* 10.3* 11.3*  HCT 36.0* 32.7* 31.0* 34.9*  MCV 94.7 95.9 94.5 95.6  PLT 274 247 274 264    Microbiology: Results for orders placed or performed during the hospital encounter of 04/23/24  Culture, blood (routine x 2)     Status: None (Preliminary result)   Collection Time: 04/23/24  4:50 AM   Specimen: BLOOD RIGHT ARM  Result Value Ref Range Status   Specimen Description BLOOD RIGHT ARM  Final   Special Requests   Final    BOTTLES DRAWN AEROBIC AND ANAEROBIC Blood Culture adequate volume   Culture   Final    NO GROWTH 3 DAYS Performed at [facility], [address] [place], [place] [address]    Report Status PENDING  Incomplete  Culture, blood (routine x 2)     Status: Abnormal   Collection Time: 04/23/24  4:50 AM   Specimen: BLOOD LEFT ARM  Result Value Ref Range Status   Specimen Description   Final    BLOOD LEFT ARM Performed at [facility], [address] [place], [place] [address]    Special Requests   Final    BOTTLES DRAWN AEROBIC AND ANAEROBIC Blood Culture adequate volume Performed at [facility], [address] [place], [place] [address]    Culture  Setup Time   Final    GRAM POSITIVE COCCI AEROBIC BOTTLE  ONLY Organism ID to follow CRITICAL RESULT CALLED TO, READ BACK BY AND VERIFIED WITH: ALEX CHAPPELL @1033  04/24/24 MJU Performed at [facility], [address] [place], [place] [address]    Culture (A)  Final    STAPHYLOCOCCUS EPIDERMIDIS THE SIGNIFICANCE OF ISOLATING THIS ORGANISM FROM A SINGLE SET OF BLOOD CULTURES WHEN MULTIPLE SETS ARE DRAWN IS UNCERTAIN. PLEASE NOTIFY THE MICROBIOLOGY DEPARTMENT WITHIN ONE WEEK IF SPECIATION AND SENSITIVITIES ARE REQUIRED. Performed at [facility], [address] [place], [place] [address]    Report Status 04/25/2024 FINAL  Final  Resp panel by RT-PCR (RSV, Flu A&B, Covid) Urine, Clean Catch     Status: None   Collection Time: 04/23/24  4:50 AM   Specimen: Urine, Clean Catch; Nasal Swab  Result Value Ref Range Status   SARS Coronavirus 2 by RT PCR NEGATIVE NEGATIVE Final    Comment: (NOTE) SARS-CoV-2 target nucleic acids are NOT DETECTED.  The SARS-CoV-2 RNA is generally detectable in upper respiratory specimens during the acute phase of infection. The lowest concentration of SARS-CoV-2 viral copies this assay can detect is 138 copies/mL. A negative result does not preclude SARS-Cov-2 infection and should not be used as the sole basis for treatment or other patient management decisions. A negative result may occur with  improper specimen collection/handling, submission of specimen other than nasopharyngeal swab, presence of viral mutation(s) within the areas targeted by this assay, and inadequate number of viral copies(<138 copies/mL). A negative result must be combined with clinical observations, patient history, and epidemiological information. The expected result is Negative.  Fact Sheet for Patients:  BloggerCourse.com  Fact Sheet for Healthcare Providers:  SeriousBroker.it  This test is no t yet approved or cleared by the [place]  FDA and  has been authorized for  detection and/or diagnosis of SARS-CoV-2 by FDA under an Emergency Use Authorization (EUA). This EUA will remain  in effect (meaning this test can be used) for the duration of the COVID-19 declaration under Section 564(b)(1) of the Act, 21 U.S.C.section 360bbb-3(b)(1), unless the authorization is terminated  or  revoked sooner.       Influenza A by PCR NEGATIVE NEGATIVE Final   Influenza B by PCR NEGATIVE NEGATIVE Final    Comment: (NOTE) The Xpert Xpress SARS-CoV-2/FLU/RSV plus assay is intended as an aid in the diagnosis of influenza from Nasopharyngeal swab specimens and should not be used as a sole basis for treatment. Nasal washings and aspirates are unacceptable for Xpert Xpress SARS-CoV-2/FLU/RSV testing.  Fact Sheet for Patients: BloggerCourse.com  Fact Sheet for Healthcare Providers: SeriousBroker.it  This test is not yet approved or cleared by the [place]  FDA and has been authorized for detection and/or diagnosis of SARS-CoV-2 by FDA under an Emergency Use Authorization (EUA). This EUA will remain in effect (meaning this test can be used) for the duration of the COVID-19 declaration under Section 564(b)(1) of the Act, 21 U.S.C. section 360bbb-3(b)(1), unless the authorization is terminated or revoked.     Resp Syncytial Virus by PCR NEGATIVE NEGATIVE Final    Comment: (NOTE) Fact Sheet for Patients: BloggerCourse.com  Fact Sheet for Healthcare Providers: SeriousBroker.it  This test is not yet approved or cleared by the [place]  FDA and has been authorized for detection and/or diagnosis of SARS-CoV-2 by FDA under an Emergency Use Authorization (EUA). This EUA will remain in effect (meaning this test can be used) for the duration of the COVID-19 declaration under Section 564(b)(1) of the Act, 21 U.S.C. section 360bbb-3(b)(1), unless the authorization is  terminated or revoked.  Performed at [facility], [address] [place], [place] [address]   Blood Culture ID Panel (Reflexed)     Status: Abnormal   Collection Time: 04/23/24  4:50 AM  Result Value Ref Range Status   Enterococcus faecalis NOT DETECTED NOT DETECTED Final   Enterococcus Faecium NOT DETECTED NOT DETECTED Final   Listeria monocytogenes NOT DETECTED NOT DETECTED Final   Staphylococcus species DETECTED (A) NOT DETECTED Final    Comment: CRITICAL RESULT CALLED TO, READ BACK BY AND VERIFIED WITH: ALEX CHAPPELL @1033  04/24/24 MJU    Staphylococcus aureus (BCID) NOT DETECTED NOT DETECTED Final   Staphylococcus epidermidis DETECTED (A) NOT DETECTED Final    Comment: Methicillin (oxacillin) resistant coagulase negative staphylococcus. Possible blood culture contaminant (unless isolated from more than one blood culture draw or clinical case suggests pathogenicity). No antibiotic treatment is indicated for blood  culture contaminants. CRITICAL RESULT CALLED TO, READ BACK BY AND VERIFIED WITH: ALEX CHAPPELL @1033  04/24/24 MJU    Staphylococcus lugdunensis NOT DETECTED NOT DETECTED Final   Streptococcus species NOT DETECTED NOT DETECTED Final   Streptococcus agalactiae NOT DETECTED NOT DETECTED Final   Streptococcus pneumoniae NOT DETECTED NOT DETECTED Final   Streptococcus pyogenes NOT DETECTED NOT DETECTED Final   A.calcoaceticus-baumannii NOT DETECTED NOT DETECTED Final   Bacteroides fragilis NOT DETECTED NOT DETECTED Final   Enterobacterales NOT DETECTED NOT DETECTED Final   Enterobacter cloacae complex NOT DETECTED NOT DETECTED Final   Escherichia coli NOT DETECTED NOT DETECTED Final   Klebsiella aerogenes NOT DETECTED NOT DETECTED Final   Klebsiella oxytoca NOT DETECTED NOT DETECTED Final   Klebsiella pneumoniae NOT DETECTED NOT DETECTED Final   Proteus species NOT DETECTED NOT DETECTED Final   Salmonella species NOT DETECTED NOT DETECTED Final   Serratia  marcescens NOT DETECTED NOT DETECTED Final   Haemophilus influenzae NOT DETECTED NOT DETECTED Final   Neisseria meningitidis NOT DETECTED NOT DETECTED Final   Pseudomonas aeruginosa NOT DETECTED NOT DETECTED Final   Stenotrophomonas maltophilia NOT DETECTED NOT DETECTED Final  Candida albicans NOT DETECTED NOT DETECTED Final   Candida auris NOT DETECTED NOT DETECTED Final   Candida glabrata NOT DETECTED NOT DETECTED Final   Candida krusei NOT DETECTED NOT DETECTED Final   Candida parapsilosis NOT DETECTED NOT DETECTED Final   Candida tropicalis NOT DETECTED NOT DETECTED Final   Cryptococcus neoformans/gattii NOT DETECTED NOT DETECTED Final   Methicillin resistance mecA/C DETECTED (A) NOT DETECTED Final    Comment: CRITICAL RESULT CALLED TO, READ BACK BY AND VERIFIED WITH: ALEX CHAPPELL @1033  04/24/24 MJU Performed at [facility], [address] [place], [place] [address]     Urinalysis: No results for input(s): "COLORURINE", "LABSPEC", "PHURINE", "GLUCOSEU", "HGBUR", "BILIRUBINUR", "KETONESUR", "PROTEINUR", "UROBILINOGEN", "NITRITE", "LEUKOCYTESUR" in the last 72 hours.  Invalid input(s): "APPERANCEUR"   Imaging:  No results found.   Assessment & Plan:  83 y.o. male with a PMHx of diabetes, peripheral vascular disease, coronary artery disease, congestive heart failure, hypertension, chronic kidney disease stage IV with a GFR of around 20 cc/min now admitted with history of chest pain along with some shortness of breath.  Patient is now on 40 mg of torsemide  daily.  #1: Chronic kidney disease: Patient has chronic kidney disease stage IV with a baseline creatinine about 3.0 and a GFR of about 20 cc/min.  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  #2: Congestive heart failure: Advised the patient on the importance of fluid restriction.  Will continue with torsemide  40 mg daily.  Will also repeat a chest x-ray today.  Followed by cardiology.  #3:  Anemia: Anemia secondary to chronic kidney disease:  #4: Secondary hyperparathyroidism: Patient has been on vitamin D  and calcitriol .  Patient need to resume that as outpatient.   Principal Problem:   Chest pain Active Problems:   Angina at rest [facility])  Spoke to the patient at bedside in detail and answered all his questions to his satisfaction. Medications reviewed.  LOS: 3 Worthy Heads, MD [facility]. 5/18/20251:30 PM

## 2024-04-26 NOTE — Plan of Care (Signed)
  Problem: Education: Goal: Knowledge of General Education information will improve Description: Including pain rating scale, medication(s)/side effects and non-pharmacologic comfort measures Outcome: Progressing   Problem: Clinical Measurements: Goal: Ability to maintain clinical measurements within normal limits will improve Outcome: Progressing   Problem: Clinical Measurements: Goal: Will remain free from infection Outcome: Progressing   Problem: Clinical Measurements: Goal: Diagnostic test results will improve Outcome: Progressing   Problem: Clinical Measurements: Goal: Respiratory complications will improve Outcome: Progressing   Problem: Coping: Goal: Level of anxiety will decrease Outcome: Progressing   Problem: Safety: Goal: Ability to remain free from injury will improve Outcome: Progressing   Problem: Tissue Perfusion: Goal: Adequacy of tissue perfusion will improve Outcome: Progressing  Plan of care on going, see , MAR see flowsheet

## 2024-04-26 NOTE — Progress Notes (Signed)
 Mobility Specialist - Progress Note   04/26/24 0906  Mobility  Activity Ambulated with assistance in room;Stood at bedside;Dangled on edge of bed;Transferred from bed to chair  Level of Assistance Standby assist, set-up cues, supervision of patient - no hands on  Assistive Device None  Distance Ambulated (ft) 10 ft  Activity Response Tolerated well  Mobility Referral Yes  Mobility visit 1 Mobility  Mobility Specialist Start Time (ACUTE ONLY) 0754  Mobility Specialist Stop Time (ACUTE ONLY) 0824  Mobility Specialist Time Calculation (min) (ACUTE ONLY) 30 min   Pt sitting EOB on RA upon arrival. Pt STS and ambulates to recliner SBA with no LOB noted. Pt left in recliner with needs in reach.  Wash Hack  Mobility Specialist  04/26/24 9:07 AM

## 2024-04-27 DIAGNOSIS — R079 Chest pain, unspecified: Secondary | ICD-10-CM | POA: Diagnosis not present

## 2024-04-27 LAB — CBC
HCT: 31.7 % — ABNORMAL LOW (ref 39.0–52.0)
Hemoglobin: 10.5 g/dL — ABNORMAL LOW (ref 13.0–17.0)
MCH: 31.3 pg (ref 26.0–34.0)
MCHC: 33.1 g/dL (ref 30.0–36.0)
MCV: 94.6 fL (ref 80.0–100.0)
Platelets: 282 10*3/uL (ref 150–400)
RBC: 3.35 MIL/uL — ABNORMAL LOW (ref 4.22–5.81)
RDW: 13.3 % (ref 11.5–15.5)
WBC: 9.9 10*3/uL (ref 4.0–10.5)
nRBC: 0 % (ref 0.0–0.2)

## 2024-04-27 LAB — BASIC METABOLIC PANEL WITH GFR
Anion gap: 12 (ref 5–15)
BUN: 86 mg/dL — ABNORMAL HIGH (ref 8–23)
CO2: 22 mmol/L (ref 22–32)
Calcium: 8.6 mg/dL — ABNORMAL LOW (ref 8.9–10.3)
Chloride: 98 mmol/L (ref 98–111)
Creatinine, Ser: 3.12 mg/dL — ABNORMAL HIGH (ref 0.61–1.24)
GFR, Estimated: 19 mL/min — ABNORMAL LOW (ref >60)
Glucose, Bld: 215 mg/dL — ABNORMAL HIGH (ref 70–99)
Potassium: 3.7 mmol/L (ref 3.5–5.1)
Sodium: 132 mmol/L — ABNORMAL LOW (ref 135–145)

## 2024-04-27 LAB — GLUCOSE, CAPILLARY
Glucose-Capillary: 211 mg/dL — ABNORMAL HIGH (ref 70–99)
Glucose-Capillary: 224 mg/dL — ABNORMAL HIGH (ref 70–99)
Glucose-Capillary: 173 mg/dL — ABNORMAL HIGH (ref 70–99)
Glucose-Capillary: 259 mg/dL — ABNORMAL HIGH (ref 70–99)

## 2024-04-27 MED ORDER — SODIUM CHLORIDE 0.9 % IV BOLUS
250.0000 mL | Freq: Once | INTRAVENOUS | Status: AC
  Administered 2024-04-27: 250 mL via INTRAVENOUS

## 2024-04-27 MED ORDER — METOPROLOL SUCCINATE ER 100 MG PO TB24: 100.0000 mg | ORAL_TABLET | Freq: Every day | ORAL | 0 refills | Status: AC

## 2024-04-27 MED ORDER — DILTIAZEM HCL 30 MG PO TABS
30.0000 mg | ORAL_TABLET | Freq: Three times a day (TID) | ORAL | Status: DC
  Administered 2024-04-27 – 2024-04-28 (×2): 30 mg via ORAL
  Filled 2024-04-27 (×2): qty 1

## 2024-04-27 MED ORDER — GUAIFENESIN ER 600 MG PO TB12
600.0000 mg | ORAL_TABLET | Freq: Two times a day (BID) | ORAL | Status: DC
  Administered 2024-04-27 – 2024-04-28 (×3): 600 mg via ORAL
  Filled 2024-04-27 (×3): qty 1

## 2024-04-27 NOTE — TOC Progression Note (Signed)
 Transition of Care [facility]) - Progression Note    Patient Details  Name: Mike Decker MRN: [id number] Date of Birth: 1941/10/03  Transition of Care [facility]) CM/SW Contact  [place] L Jamara [place], [place] Phone Number: 04/27/2024, 2:55 PM  Clinical Narrative:     CSW met with pt. Pt advised that he would like outpatient PT with [facility]. Pt advised that Laurence Pons is his PCP. He has not had an encounter with her as of yet. His previous PCP, Dr. Arlys Lamer, retired. Pt advised that his wife is picking him when he is discharged.   Pt has a walker at home but reports that he rarely uses it. He stated that PT came to assess him and he was able to walk from the bed to the door. Pt gets medications from [facility] in [place].       Expected Discharge Plan and Services         Expected Discharge Date: 04/27/24                                     Social Determinants of Health (SDOH) Interventions SDOH Screenings   Food Insecurity: No Food Insecurity (04/23/2024)  Housing: Low Risk  (04/23/2024)  Transportation Needs: No Transportation Needs (04/23/2024)  Utilities: Not At Risk (04/23/2024)  Alcohol Screen: Low Risk  (09/04/2018)  Depression (PHQ2-9): Low Risk  (01/20/2024)  Financial Resource Strain: Low Risk  (06/21/2023)   Received from [facility]  Physical Activity: Insufficiently Active (09/15/2021)  Social Connections: Moderately Isolated (04/23/2024)  Stress: No Stress Concern Present (09/15/2021)  Tobacco Use: Medium Risk (04/23/2024)    Readmission Risk Interventions    04/27/2024    2:52 PM 10/30/2023   11:28 AM  Readmission Risk Prevention Plan  Transportation Screening Complete Complete  PCP or Specialist Appt within 5-7 Days  Complete  Home Care Screening  Complete  Medication Review (RN CM)  Complete  Medication Review (RN Care Manager) Complete   PCP or Specialist appointment within 3-5 days of discharge Complete   HRI or Home Care Consult Complete   SW  Recovery Care/Counseling Consult Complete   Palliative Care Screening Not Applicable   Skilled Nursing Facility Not Applicable

## 2024-04-27 NOTE — TOC Transition Note (Signed)
 Transition of Care [facility]) - Discharge Note   Patient Details  Name: Mike Decker MRN: [id number] Date of Birth: [birth year]  Transition [facility]) CM/SW Contact:  [place] L Tai Syfert, LCSW Phone Number: 04/27/2024, 2:45 PM   Clinical Narrative:     CSW completed readmission screening with patient. Pt agreed to outpatient PT at [facility]. Referral was completed and will be cosigned by MD. TOC Consult complete. No TOC needs. Pt has a walker at home. CSW signing off.   Final next level of care: OP Rehab     Patient Goals and CMS Choice            Discharge Placement                       Discharge Plan and Services Additional resources added to the After Visit Summary for                                       Social Drivers of Health (SDOH) Interventions SDOH Screenings   Food Insecurity: No Food Insecurity (04/23/2024)  Housing: Low Risk  (04/23/2024)  Transportation Needs: No Transportation Needs (04/23/2024)  Utilities: Not At Risk (04/23/2024)  Alcohol Screen: Low Risk  (09/04/2018)  Depression (PHQ2-9): Low Risk  (01/20/2024)  Financial Resource Strain: Low Risk  (06/21/2023)   Received from [facility]  Physical Activity: Insufficiently Active (09/15/2021)  Social Connections: Moderately Isolated (04/23/2024)  Stress: No Stress Concern Present (09/15/2021)  Tobacco Use: Medium Risk (04/23/2024)     Readmission Risk Interventions    10/30/2023   11:28 AM  Readmission Risk Prevention Plan  Transportation Screening Complete  PCP or Specialist Appt within 5-7 Days Complete  Home Care Screening Complete  Medication Review (RN CM) Complete

## 2024-04-27 NOTE — TOC Transition Note (Signed)
 Transition of Care [facility]) - Discharge Note   Patient Details  Name: Mike Decker MRN: [id number] Date of Birth: 09/11/1941  Transition of Care [facility]) CM/SW Contact:  [place] L Natassja Ollis, LCSW Phone Number: 04/27/2024, 3:06 PM   Clinical Narrative:     Referral faxed to [facility] outpatient rehab. No further TOC needs.   Final next level of care: OP Rehab     Patient Goals and CMS Choice            Discharge Placement                       Discharge Plan and Services Additional resources added to the After Visit Summary for                                       Social Drivers of Health (SDOH) Interventions SDOH Screenings   Food Insecurity: No Food Insecurity (04/23/2024)  Housing: Low Risk  (04/23/2024)  Transportation Needs: No Transportation Needs (04/23/2024)  Utilities: Not At Risk (04/23/2024)  Alcohol Screen: Low Risk  (09/04/2018)  Depression (PHQ2-9): Low Risk  (01/20/2024)  Financial Resource Strain: Low Risk  (06/21/2023)   Received from [facility]  Physical Activity: Insufficiently Active (09/15/2021)  Social Connections: Moderately Isolated (04/23/2024)  Stress: No Stress Concern Present (09/15/2021)  Tobacco Use: Medium Risk (04/23/2024)     Readmission Risk Interventions    04/27/2024    2:52 PM 10/30/2023   11:28 AM  Readmission Risk Prevention Plan  Transportation Screening Complete Complete  PCP or Specialist Appt within 5-7 Days  Complete  Home Care Screening  Complete  Medication Review (RN CM)  Complete  Medication Review (RN Care Manager) Complete   PCP or Specialist appointment within 3-5 days of discharge Complete   HRI or Home Care Consult Complete   SW Recovery Care/Counseling Consult Complete   Palliative Care Screening Not Applicable   Skilled Nursing Facility Not Applicable

## 2024-04-27 NOTE — Progress Notes (Signed)
 [name] CARDIOLOGY PROGRESS NOTE       Patient ID: Mike Decker MRN: [id number] DOB/AGE: 1941/08/27 83 y.o.  Admit date: 04/23/2024 Referring Physician Dr. Antoniette Batty Primary Physician Laurence Pons, NP  Primary Cardiologist Dr. Beau Bound Reason for Consultation chest pain  HPI: Mike Decker is a 83 y.o. male  with a past medical history of CAD s/p PCI w/ DES RCA, prox LCx, jailed OM 2 but good flow, HFpEF (EF 55-60% 08/2022), mild to moderate AS (mean grad 19 mmHg), CKD 4, type 2 diabetes, carotid stenosis, COPD, OSA on CPAP, Barrett's esophagus, history of prostate cancer  who presented to the ED on 04/23/2024 for chest pain. Concern for worsening of AS. Cardiology was consulted for further evaluation.   Interval History: -Patient seen and examined this AM and laying in hospital bed. Patient reports have intermittent chest pain that come on at rest, but it has improved since admission.  -Patients BP borderline and HR stable this AM.  -Patient remains on room air with stable SpO2.    Pertinent Cardiac History: Cardiac PET Perfusion Test (01/02/2024):  1. No acute CT findings of the included chest. 2. Dense aortic valve calcifications. Correlate for echocardiographic evidence of aortic valve dysfunction. 3. Extensive three vessel coronary artery calcifications.  LHC (12/13/2022) Left ventriculogram preserved left ventricular function EF around 55 to 60% mild left ventricular enlargement  Left main large minor irregularities  LAD large with diffuse minor irregularities and a 50% mid lesion distal 50-75 Circumflex medium to large minor irregularities widely patent proximal stent OM1 diffusely diseased 50-75 small vessel RCA large proximal stent widely patent 50% mid lesion mixed dominant All vessels with TIMI-3 flow Intervention deferred not indicated   Review of systems complete and found to be negative unless listed above    Past Medical History:   Diagnosis Date   Anemia    Arthritis    Atrioventricular canal (AVC)    irregular heart beats   Barrett esophagus    Bronchiolitis    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, esphageal   Chronic diastolic CHF (congestive heart failure) (HCC)    Colon polyp    Diabetes mellitus without complication (HCC)    Diverticulosis    Gout    Heart disease    Hemangioma    liver   Hyperlipidemia    Hypertension    Myocardial infarct [facility])    Ocular hypertension    Peripheral vascular disease (HCC)    Skin cancer    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HCC)    Sleep apnea    Vitreoretinal degeneration     Past Surgical History:  Procedure Laterality Date   CATARACT EXTRACTION  2011, 2012   COLONOSCOPY N/A 12/14/2018   Procedure: COLONOSCOPY;  Surgeon: Irby Mannan, MD;  Location: [facility] ENDOSCOPY;  Service: Endoscopy;  Laterality: N/A;   CORONARY ANGIOPLASTY WITH STENT PLACEMENT  12/10/2010   CORONARY STENT INTERVENTION N/A 12/29/2019   Procedure: CORONARY STENT INTERVENTION;  Surgeon: Sammy Crisp, MD;  Location: [facility] INVASIVE CV LAB;  Service: Cardiovascular;  Laterality: N/A;   ESOPHAGOGASTRODUODENOSCOPY N/A 12/14/2018   Procedure: ESOPHAGOGASTRODUODENOSCOPY (EGD);  Surgeon: Irby Mannan, MD;  Location: [facility] ENDOSCOPY;  Service: Endoscopy;  Laterality: N/A;   ESOPHAGOGASTRODUODENOSCOPY (EGD) WITH PROPOFOL  N/A 06/01/2016   Procedure: ESOPHAGOGASTRODUODENOSCOPY (EGD) WITH PROPOFOL ;  Surgeon: Cassie Click, MD;  Location: [facility] ENDOSCOPY;  Service: Endoscopy;  Laterality: N/A;   ESOPHAGOGASTRODUODENOSCOPY (EGD) WITH PROPOFOL  N/A 01/31/2023   Procedure: ESOPHAGOGASTRODUODENOSCOPY (EGD) WITH PROPOFOL ;  Surgeon: Ole Berkeley,  Darren, MD;  Location: [facility] ENDOSCOPY;  Service: Endoscopy;  Laterality: N/A;   FEMORAL ARTERY STENT Right    HERNIA REPAIR     x2   INTRAOCULAR LENS INSERTION     LEFT HEART CATH AND CORONARY ANGIOGRAPHY N/A 05/09/2017   Procedure: Left Heart Cath and Coronary Angiography;  Surgeon:  Antonette Batters, MD;  Location: [facility] INVASIVE CV LAB;  Service: Cardiovascular;  Laterality: N/A;   LEFT HEART CATH AND CORONARY ANGIOGRAPHY N/A 12/08/2018   Procedure: LEFT HEART CATH AND CORONARY ANGIOGRAPHY and possible PCI and stent;  Surgeon: Antonette Batters, MD;  Location: [facility] INVASIVE CV LAB;  Service: Cardiovascular;  Laterality: N/A;   LEFT HEART CATH AND CORONARY ANGIOGRAPHY N/A 12/29/2019   Procedure: LEFT HEART CATH AND CORONARY ANGIOGRAPHY;  Surgeon: Sammy Crisp, MD;  Location: [facility] INVASIVE CV LAB;  Service: Cardiovascular;  Laterality: N/A;   LEFT HEART CATH AND CORONARY ANGIOGRAPHY Left 12/13/2022   Procedure: LEFT HEART CATH AND CORONARY ANGIOGRAPHY;  Surgeon: Antonette Batters, MD;  Location: [facility] INVASIVE CV LAB;  Service: Cardiovascular;  Laterality: Left;   NOSE SURGERY     submucous resection   PROSTATE SURGERY  12/10/2000   ROTATOR CUFF REPAIR Right     Medications Prior to Admission  Medication Sig Dispense Refill Last Dose/Taking   acetaminophen  (TYLENOL ) 500 MG tablet Take 1,000 mg by mouth every 8 (eight) hours as needed for mild pain.   Taking As Needed   atorvastatin  (LIPITOR ) 80 MG tablet Take 1 tablet (80 mg total) by mouth daily. 30 tablet 0 04/22/2024   azelastine  (ASTELIN ) 0.1 % nasal spray USE 1 TO 2 SPRAYS IN EACH NOSTRIL TWICE A DAY   04/22/2024   calcitRIOL  (ROCALTROL ) 0.25 MCG capsule Take 0.25 mcg by mouth daily.   04/22/2024   cetirizine (ZYRTEC) 5 MG tablet Take 5 mg by mouth daily.   Taking   citalopram  (CELEXA ) 10 MG tablet Take 1 tablet (10 mg total) by mouth daily. 90 tablet 3 04/22/2024   clopidogrel  (PLAVIX ) 75 MG tablet TAKE 1 TABLET BY MOUTH EVERY DAY 90 tablet 1 04/22/2024   [employer] VITAMIN B12 1000 MCG tablet TAKE 1 TABLET BY MOUTH EVERY DAY 30 tablet 0 04/22/2024   docusate sodium  (COLACE) 100 MG capsule Take 100 mg by mouth 2 (two) times daily.   04/22/2024   EPINEPHrine  (EPI-PEN) 0.3 mg/0.3 mL SOAJ injection Inject into the muscle as  needed (anaphylaxis).   Taking As Needed   ergocalciferol  (VITAMIN D2) 1.25 MG (50000 UT) capsule Take 50,000 Units by mouth every 30 (thirty) days.   04/01/2024   ferrous sulfate  325 (65 FE) MG EC tablet TAKE 1 TABLET BY MOUTH EVERY DAY 90 tablet 3 04/22/2024   fluticasone  (FLONASE ) 50 MCG/ACT nasal spray USE 1 SPRAY IN EACH NOSTRIL EVERY DAY 32 g 6 Past Week   hydrALAZINE  (APRESOLINE ) 50 MG tablet Take 25 mg by mouth 2 (two) times daily.   Taking   insulin  aspart (NOVOLOG ) 100 UNIT/ML FlexPen 14 UNITS IN AM, 30 UNITS AT LUNCH, 30 UNITS AT [name]   04/22/2024   insulin  glargine (LANTUS ) 100 UNIT/ML injection Inject 0.35 mLs (35 Units total) into the skin at bedtime. This is a decrease from your previous 45 units nightly. (Patient taking differently: Inject 60 Units into the skin at bedtime.) 10 mL 11 04/22/2024   isosorbide  mononitrate (IMDUR ) 120 MG 24 hr tablet Take 1 tablet (120 mg total) by mouth 2 (two) times daily. 60 tablet 0 04/22/2024  JARDIANCE 10 MG TABS tablet Take 10 mg by mouth daily.   04/22/2024   magnesium  oxide (MAG-OX) 400 (240 Mg) MG tablet Take 1 tablet by mouth daily.   04/22/2024   metoprolol  succinate (TOPROL -XL) 50 MG 24 hr tablet Take 1 tablet by mouth daily.   04/22/2024   mometasone  (ASMANEX ) 220 MCG/ACT inhaler Inhale 2 puffs into the lungs daily.   04/22/2024   montelukast  (SINGULAIR ) 10 MG tablet TAKE 1 TABLET AT BEDTIME 90 tablet 3 04/22/2024   nitroGLYCERIN  (NITRODUR - DOSED IN MG/24 HR) 0.4 mg/hr patch Place 0.4 mg onto the skin daily.   04/22/2024   nitroGLYCERIN  (NITROSTAT ) 0.4 MG SL tablet Place 0.4 mg under the tongue every 5 (five) minutes x 3 doses as needed for chest pain.   Taking As Needed   pantoprazole  (PROTONIX ) 40 MG tablet Take 1 tablet (40 mg total) by mouth 2 (two) times daily before a meal.   04/22/2024   ranolazine  (RANEXA ) 500 MG 12 hr tablet Take 1 tablet by mouth 2 (two) times daily.   04/22/2024   Tiotropium Bromide-Olodaterol (STIOLTO RESPIMAT) 2.5-2.5  MCG/ACT AERS Inhale 1 each into the lungs daily.   04/22/2024   torsemide  (DEMADEX ) 20 MG tablet Take 40 mg by mouth 2 (two) times daily.   04/22/2024   folic acid  (FOLVITE ) 400 MCG tablet Take 400 mcg by mouth daily. (Patient not taking: Reported on 04/23/2024)   Not Taking   sodium fluoride  (DENTA 5000 PLUS) 1.1 % CREA dental cream See admin instructions.      Social History   Socioeconomic History   Marital status: Married    Spouse name: Not on file   Number of children: 2   Years of education: College 3 years   Highest education level: Associate degree: occupational, Scientist, product/process development, or vocational program  Occupational History   Occupation: retired  Tobacco Use   Smoking status: Former    Current packs/day: 0.00    Types: Cigarettes    Start date: 12/10/1956    Quit date: 12/10/1976    Years since quitting: 47.4   Smokeless tobacco: Never  Vaping Use   Vaping status: Never Used  Substance and Sexual Activity   Alcohol use: No   Drug use: No   Sexual activity: Not Currently  Other Topics Concern   Not on file  Social History Narrative   Not on file   Social Drivers of Health   Financial Resource Strain: Low Risk  (06/21/2023)   Received from [facility]   Overall Financial Resource Strain (CARDIA)    Difficulty of Paying Living Expenses: Not very hard  Food Insecurity: No Food Insecurity (04/23/2024)   Hunger Vital Sign    Worried About Running Out of Food in the Last Year: Never true    Ran Out of Food in the Last Year: Never true  Transportation Needs: No Transportation Needs (04/23/2024)   PRAPARE - Administrator, Civil Service (Medical): No    Lack of Transportation (Non-Medical): No  Physical Activity: Insufficiently Active (09/15/2021)   Exercise Vital Sign    Days of Exercise per Week: 7 days    Minutes of Exercise per Session: 20 min  Stress: No Stress Concern Present (09/15/2021)   [employer] of Occupational Health - Occupational  Stress Questionnaire    Feeling of Stress : Not at all  Social Connections: Moderately Isolated (04/23/2024)   Social Connection and Isolation Panel [NHANES]    Frequency of Communication with  Friends and Family: Once a week    Frequency of Social Gatherings with Friends and Family: Once a week    Attends Religious Services: More than 4 times per year    Active Member of [employer] or Organizations: No    Attends Banker Meetings: Never    Marital Status: Married  Catering manager Violence: Not At Risk (04/23/2024)   Humiliation, Afraid, Rape, and Kick questionnaire    Fear of Current or Ex-Partner: No    Emotionally Abused: No    Physically Abused: No    Sexually Abused: No    Family History  Problem Relation Age of Onset   Arthritis Mother    Stroke Maternal Grandfather    Breast cancer Neg Hx      Vitals:   04/27/24 0515 04/27/24 0737 04/27/24 1115 04/27/24 1632  BP: (!) 98/38 (!) 105/53 (!) 139/56 (!) 117/43  Pulse:  (!) 57 61 (!) 57  Resp:      Temp:  98.2 F (36.8 C) 97.8 F (36.6 C) 98.1 F (36.7 C)  TempSrc:    Oral  SpO2:  99% 99% 99%  Weight:      Height:        PHYSICAL EXAM General: Chronically ill appearing male, well nourished, in no acute distress. HEENT: Normocephalic and atraumatic. Neck: No JVD.  Lungs: Normal respiratory effort on room air. Clear bilaterally to auscultation. No wheezes, crackles, rhonchi.  Heart: HRRR. Normal S1 and S2 without gallops. + systolic murmur.  Abdomen: Non-distended appearing.  Msk: Normal strength and tone for age. Extremities: Warm and well perfused. No clubbing, cyanosis. No edema.  Neuro: Alert and oriented X 3. Psych: Answers questions appropriately.   Labs: Basic Metabolic Panel: Recent Labs    04/26/24 0936 04/27/24 0351  NA 131* 132*  K 4.1 3.7  CL 98 98  CO2 22 22  GLUCOSE 257* 215*  BUN 78* 86*  CREATININE 3.04* 3.12*  CALCIUM  8.8* 8.6*   Liver Function Tests: No results for input(s):  "AST", "ALT", "ALKPHOS", "BILITOT", "PROT", "ALBUMIN " in the last 72 hours.  No results for input(s): "LIPASE", "AMYLASE" in the last 72 hours.  CBC: Recent Labs    04/26/24 0936 04/27/24 0351  WBC 11.2* 9.9  HGB 11.3* 10.5*  HCT 34.9* 31.7*  MCV 95.6 94.6  PLT 264 282   Cardiac Enzymes: No results for input(s): "CKTOTAL", "CKMB", "CKMBINDEX", "TROPONINIHS" in the last 72 hours.  BNP: No results for input(s): "BNP" in the last 72 hours. D-Dimer: No results for input(s): "DDIMER" in the last 72 hours. Hemoglobin A1C: No results for input(s): "HGBA1C" in the last 72 hours. Fasting Lipid Panel: No results for input(s): "CHOL", "HDL", "LDLCALC", "TRIG", "CHOLHDL", "LDLDIRECT" in the last 72 hours.  Thyroid  Function Tests: No results for input(s): "TSH", "T4TOTAL", "T3FREE", "THYROIDAB" in the last 72 hours.  Invalid input(s): "FREET3" Anemia Panel: No results for input(s): "VITAMINB12", "FOLATE", "FERRITIN", "TIBC", "IRON ", "RETICCTPCT" in the last 72 hours.   Radiology: [facility] Chest Port 1 View Result Date: 04/26/2024 CLINICAL DATA:  [id number] SOB (shortness of breath) [id number] EXAM: PORTABLE CHEST 1 VIEW COMPARISON:  Apr 23, 2024, December 16, 2019 FINDINGS: The cardiomediastinal silhouette is unchanged and enlarged in contour. No pleural effusion. No pneumothorax. No acute pleuroparenchymal abnormality. IMPRESSION: No acute cardiopulmonary abnormality. Electronically Signed   By: Clancy Crimes M.D.   On: 04/26/2024 16:59   ECHOCARDIOGRAM COMPLETE Result Date: 04/24/2024    ECHOCARDIOGRAM REPORT   Patient Name:   Chelsey  [name] Deboer Date of Exam: 04/24/2024 Medical Rec #:  [id number]            Height:       66.0 in Accession #:    [id number]           Weight:       180.0 lb Date of Birth:  09-10-1941            BSA:          1.912 m Patient Age:    82 years             BP:           115/52 mmHg Patient Gender: M                    HR:           64 bpm. Exam Location:  [facility] Procedure: 2D  Echo, Cardiac Doppler and Color Doppler (Both Spectral and Color            Flow Doppler were utilized during procedure). Indications:     Aortic stenosis I35.0  History:         Patient has prior history of Echocardiogram examinations, most                  recent 04/18/2023. Risk Factors:Hypertension, Diabetes and                  Dyslipidemia.  Sonographer:     Broadus Canes Referring Phys:  [id number] CARALYN HUDSON Diagnosing Phys: Sabina Custovic IMPRESSIONS  1. Left ventricular ejection fraction, by estimation, is 60 to 65%. The left ventricle has normal function. The left ventricle has no regional wall motion abnormalities. Left ventricular diastolic parameters are consistent with Grade II diastolic dysfunction (pseudonormalization).  2. Right ventricular systolic function is normal. The right ventricular size is normal.  3. Left atrial size was mildly dilated.  4. The mitral valve is normal in structure. Mild mitral valve regurgitation. No evidence of mitral stenosis.  5. The aortic valve is calcified. Aortic valve regurgitation is not visualized. Mild to moderate aortic valve stenosis. Aortic valve area, by VTI measures 1.20 cm. Aortic valve mean gradient measures 25.3 mmHg. Aortic valve Vmax measures 3.16 m/s.  6. The inferior vena cava is normal in size with greater than 50% respiratory variability, suggesting right atrial pressure of 3 mmHg. FINDINGS  Left Ventricle: Left ventricular ejection fraction, by estimation, is 60 to 65%. The left ventricle has normal function. The left ventricle has no regional wall motion abnormalities. The left ventricular internal cavity size was normal in size. There is  no left ventricular hypertrophy. Left ventricular diastolic parameters are consistent with Grade II diastolic dysfunction (pseudonormalization). Right Ventricle: The right ventricular size is normal. No increase in right ventricular wall thickness. Right ventricular systolic function is normal. Left Atrium: Left  atrial size was mildly dilated. Right Atrium: Right atrial size was normal in size. Pericardium: There is no evidence of pericardial effusion. Mitral Valve: The mitral valve is normal in structure. Mild mitral valve regurgitation. No evidence of mitral valve stenosis. MV peak gradient, 7.5 mmHg. The mean mitral valve gradient is 3.0 mmHg. Tricuspid Valve: The tricuspid valve is normal in structure. Tricuspid valve regurgitation is trivial. Aortic Valve: The aortic valve is calcified. Aortic valve regurgitation is not visualized. Mild to moderate aortic stenosis is present. Aortic valve mean gradient measures 25.3 mmHg. Aortic valve peak gradient measures 39.9 mmHg. Aortic valve  area, by VTI measures 1.20 cm. Pulmonic Valve: The pulmonic valve was normal in structure. Pulmonic valve regurgitation is not visualized. Aorta: The aortic root is normal in size and structure. Venous: The inferior vena cava is normal in size with greater than 50% respiratory variability, suggesting right atrial pressure of 3 mmHg. IAS/Shunts: No atrial level shunt detected by color flow Doppler.  LEFT VENTRICLE PLAX 2D LVIDd:         4.60 cm   Diastology LVIDs:         3.00 cm   LV e' medial:    4.79 cm/s LV PW:         1.00 cm   LV E/e' medial:  16.6 LV IVS:        1.30 cm   LV e' lateral:   6.42 cm/s LVOT diam:     2.20 cm   LV E/e' lateral: 12.4 LV SV:         99 LV SV Index:   52 LVOT Area:     3.80 cm  RIGHT VENTRICLE RV Basal diam:  2.60 cm RV Mid diam:    2.70 cm RV S prime:     13.50 cm/s TAPSE (M-mode): 2.2 cm LEFT ATRIUM             Index        RIGHT ATRIUM          Index LA diam:        4.10 cm 2.14 cm/m   RA Area:     9.12 cm LA Vol (A2C):   70.3 ml 36.76 ml/m  RA Volume:   16.80 ml 8.79 ml/m LA Vol (A4C):   55.6 ml 29.08 ml/m LA Biplane Vol: 62.8 ml 32.84 ml/m  AORTIC VALVE AV Area (Vmax):    1.06 cm AV Area (Vmean):   0.94 cm AV Area (VTI):     1.20 cm AV Vmax:           315.67 cm/s AV Vmean:          240.667 cm/s  AV VTI:            0.822 m AV Peak Grad:      39.9 mmHg AV Mean Grad:      25.3 mmHg LVOT Vmax:         87.80 cm/s LVOT Vmean:        59.300 cm/s LVOT VTI:          0.260 m LVOT/AV VTI ratio: 0.32  AORTA Ao Root diam: 3.20 cm MITRAL VALVE                TRICUSPID VALVE MV Area (PHT): 3.06 cm     TR Peak grad:   20.2 mmHg MV Area VTI:   3.32 cm     TR Vmax:        225.00 cm/s MV Peak grad:  7.5 mmHg MV Mean grad:  3.0 mmHg     SHUNTS MV Vmax:       1.37 m/s     Systemic VTI:  0.26 m MV Vmean:      79.0 cm/s    Systemic Diam: 2.20 cm MV Decel Time: 248 msec MV E velocity: 79.70 cm/s MV A velocity: 127.00 cm/s MV E/A ratio:  0.63 Sabina Custovic Electronically signed by Isabell Manzanilla Signature Date/Time: 04/24/2024/11:59:28 AM    Final    DG Chest Port 1 View Result Date: 04/23/2024 CLINICAL DATA:  Chest pain EXAM:  PORTABLE CHEST 1 VIEW COMPARISON:  11/19/2023 FINDINGS: The heart size and mediastinal contours are within normal limits. Both lungs are clear. The visualized skeletal structures are unremarkable. IMPRESSION: No active disease. Electronically Signed   By: Juanetta Nordmann M.D.   On: 04/23/2024 04:00    ECHO as above  TELEMETRY reviewed by me 04/27/2024: sinus rhythm with PVCs, rate 70s  EKG reviewed by me: NSR rate 65 bpm  Data reviewed by me 04/27/2024: last 24h vitals tele labs imaging I/O ED provider note, admission H&P  Principal Problem:   Chest pain Active Problems:   Angina at rest [facility])    ASSESSMENT AND PLAN:  Koden Hunzeker is a 83 y.o. male  with a past medical history of CAD s/p PCI w/ DES RCA, prox LCx, jailed OM 2 but good flow, HFpEF (EF 55-60% 08/2022), mild to moderate AS (mean grad 19 mmHg), CKD 4, type 2 diabetes, carotid stenosis, COPD, OSA on CPAP, Barrett's esophagus, history of prostate cancer  who presented to the ED on 04/23/2024 for chest pain. Concern for worsening of AS. Cardiology was consulted for further evaluation.   # Chronic angina # Coronary artery  disease s/p multiple prior stents # Moderate aortic stenosis Patient with history of chronic angina presenting with CP which began overnight, took 3 NTG at home and full dose ASA with minimal improvement. CP improved at this time. Troponins 24 > 37. EKG stable from prior, nonacute. Echo this admission with pEF, no RWMA, grade II diastolic dysfunction, mild MR, mild to moderate AS. Per tele overnight had 8 beats of VT.  -Start diltiazem  30 mg TID. HOLD SBP < 100, HR < 50. -Continue metoprolol  succinate to 100 mg daily. Continue imdur  120 mg twice daily, ranexa  500 mg twice daily (max dose given renal function).  -Continue lipitor  80 mg daily, plavix  75 mg daily.  -Minimally elevated troponin most consistent with demand/supply mismatch and not ACS.  -PRN Nitropaste ordered for worsening CP.  -Plan for discharge tomorrow.  # Chronic HFpEF Appears euvolemic on exam. CXR without evidence of edema.  -Continue home meds as prescribed.   # Paroxysmal atrial fibrillation Patient with AF not on anticoagulation due to bleeding issues. Remaining in NSR on tele.  -See plan above for metoprolol .    # CKD stage IV Patient with chronic kidney disease with baseline Cr 2.5-3.0, now with Cr 2.92 on presentation.  -Continue close follow up with nephrology.   This patient's plan of care was discussed and created with Dr. Beau Bound and he is in agreement.  Signed: Creighton Doffing, PA-C  04/27/2024, 5:20 PM [facility] Cardiology

## 2024-04-27 NOTE — Inpatient Diabetes Management (Signed)
 Inpatient Diabetes Program Recommendations  AACE/ADA: New Consensus Statement on Inpatient Glycemic Control (2015)  Target Ranges:  Prepandial:   less than 140 mg/dL      Peak postprandial:   less than 180 mg/dL (1-2 hours)      Critically ill patients:  140 - 180 mg/dL   Lab Results  Component Value Date   GLUCAP 224 (H) 04/27/2024   HGBA1C 7.1 (H) 10/31/2023    Review of Glycemic Control  Latest Reference Range & Units 04/26/24 08:27 04/26/24 11:53 04/26/24 16:31 04/26/24 20:56 04/27/24 07:37 04/27/24 11:15  Glucose-Capillary 70 - 99 mg/dL 161 (H) 096 (H) 045 (H) 234 (H) 211 (H) 224 (H)  (H): Data is abnormally high Diabetes history: Type 2 DM Outpatient Diabetes medications: Novolog  14B/ 30L/ 30 D, Lantus  60 units at bedtime, Jardiance 10 mg QD Current orders for Inpatient glycemic control: Novolog  0-9 units TID & HS, Semglee  15 units QHS  Inpatient Diabetes Program Recommendations:    Consider further increasing Semglee  to 20 units QHS  Thanks, Marjo Sievert, MSN, RNC-OB Diabetes Coordinator [phone] (8a-5p)

## 2024-04-27 NOTE — TOC Initial Note (Signed)
 Transition of Care [facility]) - Initial/Assessment Note    Patient Details  Name: Mike Decker MRN: [id number] Date of Birth: 09/13/1941  Transition of Care [facility]) CM/SW Contact:    [place] L Arda Keadle, LCSW Phone Number: 04/27/2024, 3:36 PM  Clinical Narrative:                  CSW spoke with pt. Pt. Advised that his wife will transport him when discharged. Pt stated that he would like a referral to be made with [facility] for outpatient PT.   Expected Discharge Plan: Home/Self Care, Outpatient PT with [facility]     Patient Goals and CMS Choice            Expected Discharge Plan and Services         Expected Discharge Date: 04/27/24                                    Prior Living Arrangements/Services   Lives with:: Spouse Patient language and need for interpreter reviewed:: Yes Do you feel safe going back to the place where you live?: Yes      Need for Family Participation in Patient Care: Yes (Comment) Care giver support system in place?: Yes (comment)   Criminal Activity/Legal Involvement Pertinent to Current Situation/Hospitalization: No - Comment as needed  Activities of Daily Living   ADL Screening (condition at time of admission) Independently performs ADLs?: Yes (appropriate for developmental age) Is the patient deaf or have difficulty hearing?: No Does the patient have difficulty seeing, even when wearing glasses/contacts?: No Does the patient have difficulty concentrating, remembering, or making decisions?: No  Permission Sought/Granted                  Emotional Assessment Appearance:: Appears stated age Attitude/Demeanor/Rapport: Engaged Affect (typically observed): Accepting Orientation: : Oriented to Self, Oriented to Place, Oriented to  Time, Oriented to Situation Alcohol / Substance Use: Not Applicable Psych Involvement: No (comment)  Admission diagnosis:  Angina at rest [facility]) [I20.89] Elevated troponin [R79.89] Chest pain  [R07.9] Chest pain due to myocardial ischemia, unspecified ischemic chest pain type [I25.9] Chronic kidney disease, unspecified CKD stage [N18.9] Sepsis, due to unspecified organism, unspecified whether acute organ dysfunction present [facility]) [A41.9] Patient Active Problem List   Diagnosis Date Noted   Angina at rest [facility]) 04/23/2024   Atrial fibrillation, chronic (HCC) 10/30/2023   Obesity (BMI 30-39.9) 04/24/2023   Moderate aortic stenosis 04/24/2023   Paroxysmal atrial fibrillation with RVR (HCC) 04/24/2023   Bronchiectasis (HCC) 04/24/2023   Hemoptysis 04/24/2023   Acute renal failure superimposed on stage 4 chronic kidney disease (HCC) 04/18/2023   Leukocytosis 04/18/2023   Dysphagia 01/31/2023   Vitamin B12 deficiency 12/26/2022   Type II diabetes mellitus with renal manifestations (HCC) 08/15/2022   Depression 08/15/2022   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   Urinary tract infection without hematuria 11/01/2020   Dysuria 11/01/2020   Hyposmolality and/or hyponatremia 09/14/2020   Cough 09/13/2020   Bronchitis 07/15/2020   Palpitations 07/15/2020   Aortic atherosclerosis (HCC) 06/21/2020   (HFpEF) heart failure with preserved ejection fraction (HCC) 06/21/2020   Otalgia of both ears 05/27/2020   Lobar pneumonia (HCC)    Type 2 diabetes mellitus with stage 4 chronic kidney disease, without long-term current use of insulin  (HCC) 12/14/2019   Elevated troponin    Acidosis 10/30/2019   Benign hypertensive kidney disease with chronic kidney  disease 10/30/2019   Hyperkalemia 10/30/2019   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/30/2019   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   Encounter for fitting and adjustment of hearing aid 06/16/2019   Health maintenance alteration 06/16/2019   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/16/2019   DM type 2 with diabetic peripheral neuropathy (HCC) 01/25/2019   CKD (chronic kidney disease) stage 4, GFR 15-29 ml/min  (HCC) 01/21/2019   Gout 01/21/2019   MR (mitral regurgitation) 01/21/2019   Chronic obstructive pulmonary disease (HCC) 01/08/2019   OSA (obstructive sleep apnea) 01/08/2019   Acute on chronic diastolic CHF (congestive heart failure) (HCC) 12/30/2018   Polyp of sigmoid colon    Benign neoplasm of ascending colon    Hemorrhoids without complication    Diverticulosis of large intestine without diverticulitis    Reflux esophagitis    Lymphangiectasia    Arteriovenous malformation of duodenum    Symptomatic anemia    SOB (shortness of breath)    Iron  deficiency anemia due to chronic blood loss    Unstable angina (HCC)    Anemia of chronic renal failure, stage 4 (severe) (HCC)    Chest pain 11/14/2018   Sciatica 11/12/2018   CAD (coronary artery disease) 02/27/2018   Sacroiliac joint pain 10/08/2017   Pain in limb 09/24/2017   Neuropathy 07/24/2017   NSTEMI (non-ST elevated myocardial infarction) (HCC) 05/06/2017   PAD (peripheral artery disease) (HCC) 05/01/2017   History of stroke 01/02/2017   Barrett's esophagus 10/02/2016   Carotid stenosis 10/02/2016   Essential hypertension 10/02/2016   Hyperlipidemia 10/02/2016   Diabetes (HCC) 10/02/2016   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, [place] - [address] [place] [address] Phone: [phone] Fax: [phone]  [facility] - [place], [place] - [address] [place] [address] Phone: [phone] Fax: [phone]     Social Drivers of Health (SDOH) Social History: SDOH Screenings   Food Insecurity: No Food Insecurity (04/23/2024)  Housing: Low Risk  (04/23/2024)  Transportation Needs: No Transportation  Needs (04/23/2024)  Utilities: Not At Risk (04/23/2024)  Alcohol Screen: Low Risk  (09/04/2018)  Depression (PHQ2-9): Low Risk  (01/20/2024)  Financial Resource Strain: Low Risk  (06/21/2023)   Received from [facility]  Physical Activity: Insufficiently Active (09/15/2021)  Social Connections: Moderately Isolated (04/23/2024)  Stress: No Stress Concern Present (09/15/2021)  Tobacco Use: Medium Risk (04/23/2024)   SDOH Interventions:     Readmission Risk Interventions    04/27/2024    2:52 PM 10/30/2023   11:28 AM  Readmission Risk Prevention Plan  Transportation Screening Complete Complete  PCP or Specialist Appt within 5-7 Days  Complete  Home Care Screening  Complete  Medication Review (RN CM)  Complete  Medication Review (RN Care Manager) Complete   PCP or Specialist appointment within 3-5 days of discharge Complete   HRI or Home Care Consult Complete   SW Recovery Care/Counseling Consult Complete   Palliative Care Screening Not Applicable   Skilled Nursing Facility Not Applicable

## 2024-04-27 NOTE — Evaluation (Signed)
 Physical Therapy Evaluation Patient Details Name: Mike Decker MRN: [id number] DOB: [birth year] Today's Date: 04/27/2024  History of Present Illness  Pt admitted for chest pain with complaints of chest discomfort. Cleared by MD to perform mobility. History includes CAD s/p PCI, heart failure, CKD stage 4, DM, and COPD.  Clinical Impression  Pt is a pleasant 83 year old male who was admitted for chest pain. Pt performs bed mobility/transfers with cga and ambulation with supervision and RW. Pt demonstrates deficits with strength/pain on L hip/mobility. Pt also demonstrates decreased endurance. Would benefit from skilled PT to address above deficits and promote optimal return to PLOF. Pt will continue to receive skilled PT services while admitted and will defer to TOC/care team for updates regarding disposition planning.       If plan is discharge home, recommend the following: A little help with walking and/or transfers;A little help with bathing/dressing/bathroom;Assist for transportation;Help with stairs or ramp for entrance   Can travel by private vehicle        Equipment Recommendations None recommended by PT  Recommendations for Other Services       Functional Status Assessment Patient has had a recent decline in their functional status and demonstrates the ability to make significant improvements in function in a reasonable and predictable amount of time.     Precautions / Restrictions Precautions Precautions: Fall Recall of Precautions/Restrictions: Intact Restrictions Weight Bearing Restrictions Per Provider Order: No      Mobility  Bed Mobility Overal bed mobility: Needs Assistance Bed Mobility: Supine to Sit     Supine to sit: Contact guard     General bed mobility comments: needs assist for B LE. Once seated at EOB, upright posture noted    Transfers Overall transfer level: Needs assistance Equipment used: None Transfers: Sit to/from Stand Sit to  Stand: Contact guard assist           General transfer comment: able to stand with ease    Ambulation/Gait Ambulation/Gait assistance: Supervision Gait Distance (Feet): 40 Feet Assistive device: Rolling walker (2 wheels) Gait Pattern/deviations: Step-through pattern       General Gait Details: initially ambulated a few steps with no AD with antalgic gait and limp noted. Reports increased L hip pain. Pt able to perform gait with improved mobility with B UE support on RW  Stairs            Wheelchair Mobility     Tilt Bed    Modified Rankin (Stroke Patients Only)       Balance Overall balance assessment: Needs assistance Sitting-balance support: Feet supported Sitting balance-Leahy Scale: Good     Standing balance support: Bilateral upper extremity supported Standing balance-Leahy Scale: Fair                               Pertinent Vitals/Pain Pain Assessment Pain Assessment: 0-10 Pain Score: 6  Pain Location: chest and going down arm- MD notified Pain Descriptors / Indicators: Aching Pain Intervention(s): Limited activity within patient's tolerance, Repositioned    Home Living Family/patient expects to be discharged to:: Private residence Living Arrangements: Spouse/significant other Available Help at Discharge: Family Type of Home: House Home Access: Ramped entrance       Home Layout: One level Home Equipment: Cane - single point;Shower seat - built in;Rollator (4 wheels)      Prior Function Prior Level of Function : Independent/Modified Independent  Mobility Comments: Uses SPC in the community, no AD at home, no falls hx ADLs Comments: IND other than driving     Extremity/Trunk Assessment   Upper Extremity Assessment Upper Extremity Assessment: Overall WFL for tasks assessed    Lower Extremity Assessment Lower Extremity Assessment: Generalized weakness (B LE grossly 4/5)       Communication    Communication Communication: No apparent difficulties    Cognition Arousal: Alert Behavior During Therapy: WFL for tasks assessed/performed   PT - Cognitive impairments: No apparent impairments                       PT - Cognition Comments: pleasant and agreeable to session Following commands: Intact       Cueing Cueing Techniques: Verbal cues     General Comments      Exercises     Assessment/Plan    PT Assessment Patient needs continued PT services  PT Problem List Decreased strength;Decreased activity tolerance;Decreased balance;Decreased mobility;Cardiopulmonary status limiting activity       PT Treatment Interventions DME instruction;Gait training;Therapeutic exercise;Balance training    PT Goals (Current goals can be found in the Care Plan section)  Acute Rehab PT Goals Patient Stated Goal: to go home PT Goal Formulation: With patient Time For Goal Achievement: 05/11/24 Potential to Achieve Goals: Good    Frequency Min 1X/week     Co-evaluation               AM-PAC PT "6 Clicks" Mobility  Outcome Measure Help needed turning from your back to your side while in a flat bed without using bedrails?: None Help needed moving from lying on your back to sitting on the side of a flat bed without using bedrails?: A Little Help needed moving to and from a bed to a chair (including a wheelchair)?: A Little Help needed standing up from a chair using your arms (e.g., wheelchair or bedside chair)?: A Little Help needed to walk in hospital room?: A Little Help needed climbing 3-5 steps with a railing? : A Lot 6 Click Score: 18    End of Session   Activity Tolerance: Patient tolerated treatment well Patient left: in bed;with bed alarm set Nurse Communication: Mobility status PT Visit Diagnosis: Unsteadiness on feet (R26.81);Muscle weakness (generalized) (M62.81);Difficulty in walking, not elsewhere classified (R26.2);Pain Pain - Right/Left:   (midline) Pain - part of body:  (chest)    Time: 1191-4782 PT Time Calculation (min) (ACUTE ONLY): 21 min   Charges:   PT Evaluation $PT Eval Low Complexity: 1 Low PT Treatments $Gait Training: 8-22 mins PT General Charges $$ ACUTE PT VISIT: 1 Visit         Amparo Balk, PT, DPT, GCS [phone]   Mike Decker 04/27/2024, 3:24 PM

## 2024-04-27 NOTE — Progress Notes (Signed)
 [facility]  ROUNDING NOTE   Subjective:   Patient seen sitting up in bed Alert and oriented Room air Continues to have some chest discomfort.  Voices worries about his labs  Objective:  Vital signs in last 24 hours:  Temp:  [97.7 F (36.5 C)-98.9 F (37.2 C)] 97.8 F (36.6 C) (05/19 1115) Pulse Rate:  [52-62] 61 (05/19 1115) Resp:  [16-18] 16 (05/19 0330) BP: (92-139)/(34-57) 139/56 (05/19 1115) SpO2:  [94 %-100 %] 99 % (05/19 1115) FiO2 (%):  [21 %] 21 % (05/18 2219)  Weight change:  Filed Weights   04/23/24 0331  Weight: 81.6 kg    Intake/Output: I/O last 3 completed shifts: In: 1170.4 [P.O.:920; IV Piggyback:250.4] Out: 2650 [Urine:2650]   Intake/Output this shift:  Total I/O In: -  Out: 400 [Urine:400]  Physical Exam: General: NAD  Head: Normocephalic, atraumatic. Moist oral mucosal membranes  Eyes: Anicteric  Lungs:  Clear to auscultation, normal effort  Heart: Regular rate and rhythm  Abdomen:  Soft, nontender  Extremities:  No peripheral edema.  Neurologic: Alert, moving all four extremities  Skin: No lesions       Basic Metabolic Panel: Recent Labs  Lab 04/23/24 0328 04/24/24 0541 04/25/24 0533 04/26/24 0936 04/27/24 0351  NA 134* 138 137 131* 132*  K 4.2 4.1 3.8 4.1 3.7  CL 99 103 102 98 98  CO2 23 25 23 22 22   GLUCOSE 250* 133* 154* 257* 215*  BUN 61* 57* 66* 78* 86*  CREATININE 2.92* 2.83* 3.08* 3.04* 3.12*  CALCIUM  8.6* 9.0 8.8* 8.8* 8.6*  MG  --  2.2  --   --   --     Liver Function Tests: Recent Labs  Lab 04/23/24 0328  AST 19  ALT 16  ALKPHOS 51  BILITOT 0.7  PROT 6.8  ALBUMIN  3.6   Recent Labs  Lab 04/23/24 0513  LIPASE 39   No results for input(s): "AMMONIA" in the last 168 hours.  CBC: Recent Labs  Lab 04/23/24 0328 04/24/24 0541 04/25/24 0533 04/26/24 0936 04/27/24 0351  WBC 9.1 7.1 8.3 11.2* 9.9  NEUTROABS 6.5  --   --   --   --   HGB 12.1* 10.6* 10.3* 11.3* 10.5*  HCT 36.0* 32.7*  31.0* 34.9* 31.7*  MCV 94.7 95.9 94.5 95.6 94.6  PLT 274 247 274 264 282    Cardiac Enzymes: No results for input(s): "CKTOTAL", "CKMB", "CKMBINDEX", "TROPONINI" in the last 168 hours.  BNP: Invalid input(s): "POCBNP"  CBG: Recent Labs  Lab 04/26/24 1153 04/26/24 1631 04/26/24 2056 04/27/24 0737 04/27/24 1115  GLUCAP 238* 272* 234* 211* 224*    Microbiology: Results for orders placed or performed during the hospital encounter of 04/23/24  Culture, blood (routine x 2)     Status: None (Preliminary result)   Collection Time: 04/23/24  4:50 AM   Specimen: BLOOD RIGHT ARM  Result Value Ref Range Status   Specimen Description BLOOD RIGHT ARM  Final   Special Requests   Final    BOTTLES DRAWN AEROBIC AND ANAEROBIC Blood Culture adequate volume   Culture   Final    NO GROWTH 4 DAYS Performed at [facility], [address] [place], [place] [address]    Report Status PENDING  Incomplete  Culture, blood (routine x 2)     Status: Abnormal   Collection Time: 04/23/24  4:50 AM   Specimen: BLOOD LEFT ARM  Result Value Ref Range Status   Specimen Description  Final    BLOOD LEFT ARM Performed at [facility], [address], [place], [place] [address]    Special Requests   Final    BOTTLES DRAWN AEROBIC AND ANAEROBIC Blood Culture adequate volume Performed at [facility], [address] [place], [place] [address]    Culture  Setup Time   Final    GRAM POSITIVE COCCI AEROBIC BOTTLE ONLY Organism ID to follow CRITICAL RESULT CALLED TO, READ BACK BY AND VERIFIED WITH: ALEX CHAPPELL @1033  04/24/24 MJU Performed at [facility], [address] [place], [place] [address]    Culture (A)  Final    STAPHYLOCOCCUS EPIDERMIDIS THE SIGNIFICANCE OF ISOLATING THIS ORGANISM FROM A SINGLE SET OF BLOOD CULTURES WHEN MULTIPLE SETS ARE DRAWN IS UNCERTAIN. PLEASE NOTIFY THE MICROBIOLOGY DEPARTMENT WITHIN ONE WEEK IF SPECIATION AND SENSITIVITIES ARE  REQUIRED. Performed at [facility], [address], [place], [place] [address]    Report Status 04/25/2024 FINAL  Final  Resp panel by RT-PCR (RSV, Flu A&B, Covid) Urine, Clean Catch     Status: None   Collection Time: 04/23/24  4:50 AM   Specimen: Urine, Clean Catch; Nasal Swab  Result Value Ref Range Status   SARS Coronavirus 2 by RT PCR NEGATIVE NEGATIVE Final    Comment: (NOTE) SARS-CoV-2 target nucleic acids are NOT DETECTED.  The SARS-CoV-2 RNA is generally detectable in upper respiratory specimens during the acute phase of infection. The lowest concentration of SARS-CoV-2 viral copies this assay can detect is 138 copies/mL. A negative result does not preclude SARS-Cov-2 infection and should not be used as the sole basis for treatment or other patient management decisions. A negative result may occur with  improper specimen collection/handling, submission of specimen other than nasopharyngeal swab, presence of viral mutation(s) within the areas targeted by this assay, and inadequate number of viral copies(<138 copies/mL). A negative result must be combined with clinical observations, patient history, and epidemiological information. The expected result is Negative.  Fact Sheet for Patients:  BloggerCourse.com  Fact Sheet for Healthcare Providers:  SeriousBroker.it  This test is no t yet approved or cleared by the [place]  FDA and  has been authorized for detection and/or diagnosis of SARS-CoV-2 by FDA under an Emergency Use Authorization (EUA). This EUA will remain  in effect (meaning this test can be used) for the duration of the COVID-19 declaration under Section 564(b)(1) of the Act, 21 U.S.C.section 360bbb-3(b)(1), unless the authorization is terminated  or revoked sooner.       Influenza A by PCR NEGATIVE NEGATIVE Final   Influenza B by PCR NEGATIVE NEGATIVE Final    Comment: (NOTE) The Xpert Xpress  SARS-CoV-2/FLU/RSV plus assay is intended as an aid in the diagnosis of influenza from Nasopharyngeal swab specimens and should not be used as a sole basis for treatment. Nasal washings and aspirates are unacceptable for Xpert Xpress SARS-CoV-2/FLU/RSV testing.  Fact Sheet for Patients: BloggerCourse.com  Fact Sheet for Healthcare Providers: SeriousBroker.it  This test is not yet approved or cleared by the [place]  FDA and has been authorized for detection and/or diagnosis of SARS-CoV-2 by FDA under an Emergency Use Authorization (EUA). This EUA will remain in effect (meaning this test can be used) for the duration of the COVID-19 declaration under Section 564(b)(1) of the Act, 21 U.S.C. section 360bbb-3(b)(1), unless the authorization is terminated or revoked.     Resp Syncytial Virus by PCR NEGATIVE NEGATIVE Final    Comment: (NOTE) Fact Sheet for  Patients: BloggerCourse.com  Fact Sheet for Healthcare Providers: SeriousBroker.it  This test is not yet approved or cleared by the [place]  FDA and has been authorized for detection and/or diagnosis of SARS-CoV-2 by FDA under an Emergency Use Authorization (EUA). This EUA will remain in effect (meaning this test can be used) for the duration of the COVID-19 declaration under Section 564(b)(1) of the Act, 21 U.S.C. section 360bbb-3(b)(1), unless the authorization is terminated or revoked.  Performed at [facility], [address] [place], [place] [address]   Blood Culture ID Panel (Reflexed)     Status: Abnormal   Collection Time: 04/23/24  4:50 AM  Result Value Ref Range Status   Enterococcus faecalis NOT DETECTED NOT DETECTED Final   Enterococcus Faecium NOT DETECTED NOT DETECTED Final   Listeria monocytogenes NOT DETECTED NOT DETECTED Final   Staphylococcus species DETECTED (A) NOT DETECTED Final     Comment: CRITICAL RESULT CALLED TO, READ BACK BY AND VERIFIED WITH: ALEX CHAPPELL @1033  04/24/24 MJU    Staphylococcus aureus (BCID) NOT DETECTED NOT DETECTED Final   Staphylococcus epidermidis DETECTED (A) NOT DETECTED Final    Comment: Methicillin (oxacillin) resistant coagulase negative staphylococcus. Possible blood culture contaminant (unless isolated from more than one blood culture draw or clinical case suggests pathogenicity). No antibiotic treatment is indicated for blood  culture contaminants. CRITICAL RESULT CALLED TO, READ BACK BY AND VERIFIED WITH: ALEX CHAPPELL @1033  04/24/24 MJU    Staphylococcus lugdunensis NOT DETECTED NOT DETECTED Final   Streptococcus species NOT DETECTED NOT DETECTED Final   Streptococcus agalactiae NOT DETECTED NOT DETECTED Final   Streptococcus pneumoniae NOT DETECTED NOT DETECTED Final   Streptococcus pyogenes NOT DETECTED NOT DETECTED Final   A.calcoaceticus-baumannii NOT DETECTED NOT DETECTED Final   Bacteroides fragilis NOT DETECTED NOT DETECTED Final   Enterobacterales NOT DETECTED NOT DETECTED Final   Enterobacter cloacae complex NOT DETECTED NOT DETECTED Final   Escherichia coli NOT DETECTED NOT DETECTED Final   Klebsiella aerogenes NOT DETECTED NOT DETECTED Final   Klebsiella oxytoca NOT DETECTED NOT DETECTED Final   Klebsiella pneumoniae NOT DETECTED NOT DETECTED Final   Proteus species NOT DETECTED NOT DETECTED Final   Salmonella species NOT DETECTED NOT DETECTED Final   Serratia marcescens NOT DETECTED NOT DETECTED Final   Haemophilus influenzae NOT DETECTED NOT DETECTED Final   Neisseria meningitidis NOT DETECTED NOT DETECTED Final   Pseudomonas aeruginosa NOT DETECTED NOT DETECTED Final   Stenotrophomonas maltophilia NOT DETECTED NOT DETECTED Final   Candida albicans NOT DETECTED NOT DETECTED Final   Candida auris NOT DETECTED NOT DETECTED Final   Candida glabrata NOT DETECTED NOT DETECTED Final   Candida krusei NOT DETECTED NOT  DETECTED Final   Candida parapsilosis NOT DETECTED NOT DETECTED Final   Candida tropicalis NOT DETECTED NOT DETECTED Final   Cryptococcus neoformans/gattii NOT DETECTED NOT DETECTED Final   Methicillin resistance mecA/C DETECTED (A) NOT DETECTED Final    Comment: CRITICAL RESULT CALLED TO, READ BACK BY AND VERIFIED WITH: ALEX CHAPPELL @1033  04/24/24 MJU Performed at [facility], [address] [place], [place] [address]     Coagulation Studies: No results for input(s): "LABPROT", "INR" in the last 72 hours.  Urinalysis: No results for input(s): "COLORURINE", "LABSPEC", "PHURINE", "GLUCOSEU", "HGBUR", "BILIRUBINUR", "KETONESUR", "PROTEINUR", "UROBILINOGEN", "NITRITE", "LEUKOCYTESUR" in the last 72 hours.  Invalid input(s): "APPERANCEUR"    Imaging: DG Chest Port 1 View Result Date: 04/26/2024 CLINICAL DATA:  [id number] SOB (shortness of breath) [id number] EXAM: PORTABLE CHEST 1 VIEW  COMPARISON:  Apr 23, 2024, December 16, 2019 FINDINGS: The cardiomediastinal silhouette is unchanged and enlarged in contour. No pleural effusion. No pneumothorax. No acute pleuroparenchymal abnormality. IMPRESSION: No acute cardiopulmonary abnormality. Electronically Signed   By: Clancy Crimes M.D.   On: 04/26/2024 16:59     Medications:     arformoterol   15 mcg Nebulization BID   And   umeclidinium bromide   1 puff Inhalation Daily   atorvastatin   80 mg Oral Daily   azelastine   1 spray Each Nare BID   budesonide  (PULMICORT ) nebulizer solution  0.25 mg Nebulization BID   citalopram   10 mg Oral Daily   clopidogrel   75 mg Oral Daily   guaiFENesin   600 mg Oral BID   heparin   5,000 Units Subcutaneous Q12H   insulin  aspart  0-5 Units Subcutaneous QHS   insulin  aspart  0-9 Units Subcutaneous TID WC   insulin  glargine-yfgn  15 Units Subcutaneous QHS   isosorbide  mononitrate  120 mg Oral BID   loratadine   10 mg Oral Daily   metoprolol  succinate  100 mg Oral Daily   montelukast   10 mg Oral QHS    pantoprazole   40 mg Oral BID AC   ranolazine   500 mg Oral BID   traZODone   25 mg Oral QHS   acetaminophen , fluticasone , HYDROmorphone  (DILAUDID ) injection, methocarbamol , nitroGLYCERIN , nitroGLYCERIN , ondansetron  (ZOFRAN ) IV, mouth rinse  Assessment/ Plan:  [name] is a 83 y.o.  male  with a PMHx of diabetes, peripheral vascular disease, coronary artery disease, congestive heart failure, hypertension, chronic kidney disease stage IV with a GFR of around 20 cc/min now admitted with history of chest pain along with some shortness of breath.   Chronic kidney disease stage IV with baseline creatinine 3.0 and GFR of 20 on 02/20/24.  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 Renal function has remained at baseline during this admission. Will continue to follow with Dr Lamount Pimple outpatient.    Lab Results  Component Value Date   CREATININE 3.12 (H) 04/27/2024   CREATININE 3.04 (H) 04/26/2024   CREATININE 3.08 (H) 04/25/2024    Intake/Output Summary (Last 24 hours) at 04/27/2024 1120 Last data filed at 04/27/2024 0900 Gross per 24 hour  Intake 250.41 ml  Output 2350 ml  Net -2099.59 ml   2: Congestive diastolic heart failure: Last ECHO on 04/24/24 shoes EF 60-65% with a grade 2 diastolic dysfunction. Advised the patient on the importance of fluid restriction.  Will continue with torsemide  40 mg daily.     3: Anemia with chronic disease: Anemia secondary to chronic kidney disease: Hgb 10.5, at goal. No indication for ESA at this time. Continues to follow hematology outpatient.    4: Secondary hyperparathyroidism: Patient has been on vitamin D  and calcitriol .  Patient need to resume that as outpatient.    LOS: 4 Jesselyn Rask 5/19/202511:20 AM

## 2024-04-27 NOTE — Discharge Summary (Addendum)
 Physician Discharge Summary  Mike Decker ZOX:[id number] DOB: June 08, 1941 DOA: 04/23/2024  PCP: Laurence Pons, NP  Admit date: 04/23/2024 Discharge date: 04/28/24  Admitted From: home  Disposition:  home   Recommendations for Outpatient Follow-up:  Follow up with PCP in 1-2 weeks F/u w/ cardio, Dr. Beau Bound, in 1-2 weeks F/u w/ nephro, Dr. Lamount Pimple, in 1-2 weeks   Home Health: no but outpatient therapy  Equipment/Devices:  Discharge Condition: stable  CODE STATUS: full  Diet recommendation: Heart Healthy / Carb Modified  Brief/Interim Summary: HPI was taken from Dr. Jeane Miguel: Mike Decker is a 83 y.o. male with medical history significant of CAD status post PCI with RCA LCx stenting, moderate aortic stenosis, chronic HFpEF, CKD stage IV, IDDM, carotid stenosis, COPD, OSA on CPAP, Barret esophagus, history of prostate cancer presented with recurrent chest pains.   Patient reported he first had chest pain back in November 2024 and was hospitalized, workup was benign and his medication adjusted and patient discharged.  He remained chest pain-free for last 4 months until last week.  Had first episode of chest pain last week while he was walking in his yard.  He took 1 nitroglycerin  and the chest pain went away.  Denied any associated symptoms.  He does report lately he has been getting frequent episode of lightheadedness and 1 episode of exertional dyspnea when he was pushing a empty trash can.  Last night he woke up around 2 AM with 10/10 pressure-like chest pain associated with palpitations, he took 3 nitroglycerin  and short successions but chest pain persisted.  He went to see [facility] surgery in January, underwent a PET scan which showed heavy calcified AAS and he was told to repeat echocardiogram in the next visit scheduled for June.   ED Course: Temperature 99.1 blood pressure 160/55, O2 saturation 98% on room air.  X-ray showed no acute infiltrates.  Blood work showed  hemoglobin 12.1, BUN 61 creatinine 2.9 compared to baseline 3.0-3.2, glucose 250.   Patient was given morphine  and Zofran  and chest pain is 6/10 now.   D/c held secondary to cardio saying "pt just wasn't feeling well" and to adjust anti-angina meds.   Discharge Diagnoses:  Principal Problem:   Chest pain Active Problems:   Angina at rest [facility])  Chest pain: chronic angina as per cardio. Continue on metoprolol , ranexa , imdur  as per cardio. Continue on tele. Cardio cleared pt for d/c. Cardio following and recs apprec   Hemoptysis: minimal. CXR shows no active disease. H&H are labile but no need for a transfusion currently    CKDIV: Cr is labile. Avoid nephrotoxic meds. 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 No need for a transfusion currently    DM2: poorly controlled. Continue on glargine, SSI w/ accuchecks    Chronic diastolic CHF: appears euvolemic. Continue on metoprolol , torsemide . Monitor I/Os   PAF:  continue on metoprolol . Not on anticoagulation secondary to hx of GI bleed   Discharge Instructions  Discharge Instructions     Diet - low sodium heart healthy   Complete by: As directed    Diet Carb Modified   Complete by: As directed    Discharge instructions   Complete by: As directed    F/u w/ PCP in 1-2 weeks. F/u w/ cardio, Dr. Beau Bound, in 1-2 weeks. F/u w/ nephro, Dr. Lamount Pimple, in 1-2 weeks.   Increase activity slowly   Complete by: As directed       Allergies as  of 04/27/2024       Reactions   Gabapentin     Other reaction(s): Other (See Comments) Tremors   Peanut-containing Drug Products Anaphylaxis   Penicillins Hives, Rash   Per notes - patient tolerated cefdinir in 2022   Bee Venom Swelling   Influenza Vaccines Hives   Inh [isoniazid ] Hives   Jardiance [empagliflozin] Other (See Comments)   Diarrhea   Kenalog [triamcinolone Acetonide] Hives   Levaquin  [levofloxacin ] Other (See Comments)   Tendon, ligament pain.     Lisinopril     Other reaction(s): Hyperkalemia   Nalfon [fenoprofen Calcium ] Hives   Naproxen    Peanut Oil    Nsaids Rash   Nalfon 600 Nalfon 600        Medication List     STOP taking these medications    folic acid  400 MCG tablet Commonly known as: FOLVITE        TAKE these medications    acetaminophen  500 MG tablet Commonly known as: TYLENOL  Take 1,000 mg by mouth every 8 (eight) hours as needed for mild pain.   atorvastatin  80 MG tablet Commonly known as: LIPITOR  Take 1 tablet (80 mg total) by mouth daily.   azelastine  0.1 % nasal spray Commonly known as: ASTELIN  USE 1 TO 2 SPRAYS IN EACH NOSTRIL TWICE A DAY   calcitRIOL  0.25 MCG capsule Commonly known as: ROCALTROL  Take 0.25 mcg by mouth daily.   cetirizine 5 MG tablet Commonly known as: ZYRTEC Take 5 mg by mouth daily.   citalopram  10 MG tablet Commonly known as: CELEXA  Take 1 tablet (10 mg total) by mouth daily.   clopidogrel  75 MG tablet Commonly known as: PLAVIX  TAKE 1 TABLET BY MOUTH EVERY DAY   [employer] VITAMIN B12 1000 MCG tablet Generic drug: cyanocobalamin  TAKE 1 TABLET BY MOUTH EVERY DAY   Denta 5000 Plus 1.1 % Crea dental cream Generic drug: sodium fluoride  See admin instructions.   docusate sodium  100 MG capsule Commonly known as: COLACE Take 100 mg by mouth 2 (two) times daily.   EPINEPHrine  0.3 mg/0.3 mL Soaj injection Commonly known as: EPI-PEN Inject into the muscle as needed (anaphylaxis).   ergocalciferol  1.25 MG (50000 UT) capsule Commonly known as: VITAMIN D2 Take 50,000 Units by mouth every 30 (thirty) days.   ferrous sulfate  325 (65 FE) MG EC tablet TAKE 1 TABLET BY MOUTH EVERY DAY   fluticasone  50 MCG/ACT nasal spray Commonly known as: FLONASE  USE 1 SPRAY IN EACH NOSTRIL EVERY DAY   hydrALAZINE  50 MG tablet Commonly known as: APRESOLINE  Take 25 mg by mouth 2 (two) times daily.   insulin  aspart 100 UNIT/ML FlexPen Commonly known as: NOVOLOG  14 UNITS IN AM, 30  UNITS AT LUNCH, 30 UNITS AT DINNER   insulin  glargine 100 UNIT/ML injection Commonly known as: LANTUS  Inject 0.35 mLs (35 Units total) into the skin at bedtime. This is a decrease from your previous 45 units nightly. What changed:  how much to take additional instructions   isosorbide  mononitrate 120 MG 24 hr tablet Commonly known as: IMDUR  Take 1 tablet (120 mg total) by mouth 2 (two) times daily.   Jardiance 10 MG Tabs tablet Generic drug: empagliflozin Take 10 mg by mouth daily.   magnesium  oxide 400 (240 Mg) MG tablet Commonly known as: MAG-OX Take 1 tablet by mouth daily.   metoprolol  succinate 100 MG 24 hr tablet Commonly known as: TOPROL -XL Take 1 tablet (100 mg total) by mouth daily. Take with or immediately following a meal. Start taking  on: Apr 28, 2024 What changed:  medication strength how much to take additional instructions   mometasone  220 MCG/ACT inhaler Commonly known as: ASMANEX  Inhale 2 puffs into the lungs daily.   montelukast  10 MG tablet Commonly known as: SINGULAIR  TAKE 1 TABLET AT BEDTIME   nitroGLYCERIN  0.4 MG SL tablet Commonly known as: NITROSTAT  Place 0.4 mg under the tongue every 5 (five) minutes x 3 doses as needed for chest pain. What changed: Another medication with the same name was removed. Continue taking this medication, and follow the directions you see here.   pantoprazole  40 MG tablet Commonly known as: PROTONIX  Take 1 tablet (40 mg total) by mouth 2 (two) times daily before a meal.   ranolazine  500 MG 12 hr tablet Commonly known as: RANEXA  Take 1 tablet by mouth 2 (two) times daily.   Stiolto Respimat 2.5-2.5 MCG/ACT Aers Generic drug: Tiotropium Bromide-Olodaterol Inhale 1 each into the lungs daily.   torsemide  20 MG tablet Commonly known as: DEMADEX  Take 40 mg by mouth 2 (two) times daily.        Follow-up Information     Antonette Batters, MD. Go in 1 week(s).   Specialties: Cardiology, Internal  Medicine Contact information: [address] [place] [address] [phone]                Allergies  Allergen Reactions   Gabapentin      Other reaction(s): Other (See Comments) Tremors   Peanut-Containing Drug Products Anaphylaxis   Penicillins Hives and Rash    Per notes - patient tolerated cefdinir in 2022    Bee Venom Swelling   Influenza Vaccines Hives   Inh [Isoniazid ] Hives   Jardiance [Empagliflozin] Other (See Comments)    Diarrhea   Kenalog [Triamcinolone Acetonide] Hives   Levaquin  [Levofloxacin ] Other (See Comments)    Tendon, ligament pain.    Lisinopril      Other reaction(s): Hyperkalemia   Nalfon [Fenoprofen Calcium ] Hives   Naproxen    Peanut Oil    Nsaids Rash    Nalfon 600 Nalfon 600    Consultations: Cardio Nephro    Procedures/Studies: DG Chest Port 1 View Result Date: 04/26/2024 CLINICAL DATA:  141880 SOB (shortness of breath) [id number] EXAM: PORTABLE CHEST 1 VIEW COMPARISON:  Apr 23, 2024, December 16, 2019 FINDINGS: The cardiomediastinal silhouette is unchanged and enlarged in contour. No pleural effusion. No pneumothorax. No acute pleuroparenchymal abnormality. IMPRESSION: No acute cardiopulmonary abnormality. Electronically Signed   By: Clancy Crimes M.D.   On: 04/26/2024 16:59   ECHOCARDIOGRAM COMPLETE Result Date: 04/24/2024    ECHOCARDIOGRAM REPORT   Patient Name:   Mike Decker Date of Exam: 04/24/2024 Medical Rec #:  [id number]            Height:       66.0 in Accession #:    [id number]           Weight:       180.0 lb Date of Birth:  November 03, 1941            BSA:          1.912 m Patient Age:    82 years             BP:           115/52 mmHg Patient Gender: M                    HR:  64 bpm. Exam Location:  [facility] Procedure: 2D Echo, Cardiac Doppler and Color Doppler (Both Spectral and Color            Flow Doppler were utilized during procedure). Indications:     Aortic stenosis I35.0  History:         Patient has  prior history of Echocardiogram examinations, most                  recent 04/18/2023. Risk Factors:Hypertension, Diabetes and                  Dyslipidemia.  Sonographer:     Broadus Canes Referring Phys:  [id number] CARALYN HUDSON Diagnosing Phys: Sabina Custovic IMPRESSIONS  1. Left ventricular ejection fraction, by estimation, is 60 to 65%. The left ventricle has normal function. The left ventricle has no regional wall motion abnormalities. Left ventricular diastolic parameters are consistent with Grade II diastolic dysfunction (pseudonormalization).  2. Right ventricular systolic function is normal. The right ventricular size is normal.  3. Left atrial size was mildly dilated.  4. The mitral valve is normal in structure. Mild mitral valve regurgitation. No evidence of mitral stenosis.  5. The aortic valve is calcified. Aortic valve regurgitation is not visualized. Mild to moderate aortic valve stenosis. Aortic valve area, by VTI measures 1.20 cm. Aortic valve mean gradient measures 25.3 mmHg. Aortic valve Vmax measures 3.16 m/s.  6. The inferior vena cava is normal in size with greater than 50% respiratory variability, suggesting right atrial pressure of 3 mmHg. FINDINGS  Left Ventricle: Left ventricular ejection fraction, by estimation, is 60 to 65%. The left ventricle has normal function. The left ventricle has no regional wall motion abnormalities. The left ventricular internal cavity size was normal in size. There is  no left ventricular hypertrophy. Left ventricular diastolic parameters are consistent with Grade II diastolic dysfunction (pseudonormalization). Right Ventricle: The right ventricular size is normal. No increase in right ventricular wall thickness. Right ventricular systolic function is normal. Left Atrium: Left atrial size was mildly dilated. Right Atrium: Right atrial size was normal in size. Pericardium: There is no evidence of pericardial effusion. Mitral Valve: The mitral valve is normal in  structure. Mild mitral valve regurgitation. No evidence of mitral valve stenosis. MV peak gradient, 7.5 mmHg. The mean mitral valve gradient is 3.0 mmHg. Tricuspid Valve: The tricuspid valve is normal in structure. Tricuspid valve regurgitation is trivial. Aortic Valve: The aortic valve is calcified. Aortic valve regurgitation is not visualized. Mild to moderate aortic stenosis is present. Aortic valve mean gradient measures 25.3 mmHg. Aortic valve peak gradient measures 39.9 mmHg. Aortic valve area, by VTI measures 1.20 cm. Pulmonic Valve: The pulmonic valve was normal in structure. Pulmonic valve regurgitation is not visualized. Aorta: The aortic root is normal in size and structure. Venous: The inferior vena cava is normal in size with greater than 50% respiratory variability, suggesting right atrial pressure of 3 mmHg. IAS/Shunts: No atrial level shunt detected by color flow Doppler.  LEFT VENTRICLE PLAX 2D LVIDd:         4.60 cm   Diastology LVIDs:         3.00 cm   LV e' medial:    4.79 cm/s LV PW:         1.00 cm   LV E/e' medial:  16.6 LV IVS:        1.30 cm   LV e' lateral:   6.42 cm/s LVOT diam:     2.20  cm   LV E/e' lateral: 12.4 LV SV:         99 LV SV Index:   52 LVOT Area:     3.80 cm  RIGHT VENTRICLE RV Basal diam:  2.60 cm RV Mid diam:    2.70 cm RV S prime:     13.50 cm/s TAPSE (M-mode): 2.2 cm LEFT ATRIUM             Index        RIGHT ATRIUM          Index LA diam:        4.10 cm 2.14 cm/m   RA Area:     9.12 cm LA Vol (A2C):   70.3 ml 36.76 ml/m  RA Volume:   16.80 ml 8.79 ml/m LA Vol (A4C):   55.6 ml 29.08 ml/m LA Biplane Vol: 62.8 ml 32.84 ml/m  AORTIC VALVE AV Area (Vmax):    1.06 cm AV Area (Vmean):   0.94 cm AV Area (VTI):     1.20 cm AV Vmax:           315.67 cm/s AV Vmean:          240.667 cm/s AV VTI:            0.822 m AV Peak Grad:      39.9 mmHg AV Mean Grad:      25.3 mmHg LVOT Vmax:         87.80 cm/s LVOT Vmean:        59.300 cm/s LVOT VTI:          0.260 m LVOT/AV VTI  ratio: 0.32  AORTA Ao Root diam: 3.20 cm MITRAL VALVE                TRICUSPID VALVE MV Area (PHT): 3.06 cm     TR Peak grad:   20.2 mmHg MV Area VTI:   3.32 cm     TR Vmax:        225.00 cm/s MV Peak grad:  7.5 mmHg MV Mean grad:  3.0 mmHg     SHUNTS MV Vmax:       1.37 m/s     Systemic VTI:  0.26 m MV Vmean:      79.0 cm/s    Systemic Diam: 2.20 cm MV Decel Time: 248 msec MV E velocity: 79.70 cm/s MV A velocity: 127.00 cm/s MV E/A ratio:  0.63 Sabina Custovic Electronically signed by Isabell Manzanilla Signature Date/Time: 04/24/2024/11:59:28 AM    Final    DG Chest Port 1 View Result Date: 04/23/2024 CLINICAL DATA:  Chest pain EXAM: PORTABLE CHEST 1 VIEW COMPARISON:  11/19/2023 FINDINGS: The heart size and mediastinal contours are within normal limits. Both lungs are clear. The visualized skeletal structures are unremarkable. IMPRESSION: No active disease. Electronically Signed   By: Juanetta Nordmann M.D.   On: 04/23/2024 04:00   (Echo, Carotid, EGD, Colonoscopy, ERCP)    Subjective: Pt c/o fatigue    Discharge Exam: Vitals:   04/27/24 0737 04/27/24 1115  BP: (!) 105/53 (!) 139/56  Pulse: (!) 57 61  Resp:    Temp: 98.2 F (36.8 C) 97.8 F (36.6 C)  SpO2: 99% 99%   Vitals:   04/27/24 0331 04/27/24 0515 04/27/24 0737 04/27/24 1115  BP: (!) 92/37 (!) 98/38 (!) 105/53 (!) 139/56  Pulse: (!) 52  (!) 57 61  Resp:      Temp:   98.2 F (36.8 C) 97.8 F (36.6  C)  TempSrc:      SpO2:   99% 99%  Weight:      Height:        General: Pt is alert, awake, not in acute distress Cardiovascular:  S1/S2 +, no rubs, no gallops Respiratory: CTA bilaterally, no wheezing, no rhonchi Abdominal: Soft, NT, ND, bowel sounds + Extremities: no cyanosis    The results of significant diagnostics from this hospitalization (including imaging, microbiology, ancillary and laboratory) are listed below for reference.     Microbiology: Recent Results (from the past 240 hours)  Culture, blood (routine x 2)      Status: None (Preliminary result)   Collection Time: 04/23/24  4:50 AM   Specimen: BLOOD RIGHT ARM  Result Value Ref Range Status   Specimen Description BLOOD RIGHT ARM  Final   Special Requests   Final    BOTTLES DRAWN AEROBIC AND ANAEROBIC Blood Culture adequate volume   Culture   Final    NO GROWTH 4 DAYS Performed at [facility], [address], [place], [place] [address]    Report Status PENDING  Incomplete  Culture, blood (routine x 2)     Status: Abnormal   Collection Time: 04/23/24  4:50 AM   Specimen: BLOOD LEFT ARM  Result Value Ref Range Status   Specimen Description   Final    BLOOD LEFT ARM Performed at [facility], [address], [place], [place] [address]    Special Requests   Final    BOTTLES DRAWN AEROBIC AND ANAEROBIC Blood Culture adequate volume Performed at [facility], [address], [place], [place] [address]    Culture  Setup Time   Final    GRAM POSITIVE COCCI AEROBIC BOTTLE ONLY Organism ID to follow CRITICAL RESULT CALLED TO, READ BACK BY AND VERIFIED WITH: ALEX CHAPPELL @1033  04/24/24 MJU Performed at [facility], [address] [place], [place] [address]    Culture (A)  Final    STAPHYLOCOCCUS EPIDERMIDIS THE SIGNIFICANCE OF ISOLATING THIS ORGANISM FROM A SINGLE SET OF BLOOD CULTURES WHEN MULTIPLE SETS ARE DRAWN IS UNCERTAIN. PLEASE NOTIFY THE MICROBIOLOGY DEPARTMENT WITHIN ONE WEEK IF SPECIATION AND SENSITIVITIES ARE REQUIRED. Performed at [facility], [address] [place], [place] [address]    Report Status 04/25/2024 FINAL  Final  Resp panel by RT-PCR (RSV, Flu A&B, Covid) Urine, Clean Catch     Status: None   Collection Time: 04/23/24  4:50 AM   Specimen: Urine, Clean Catch; Nasal Swab  Result Value Ref Range Status   SARS Coronavirus 2 by RT PCR NEGATIVE NEGATIVE Final    Comment: (NOTE) SARS-CoV-2 target nucleic acids are NOT DETECTED.  The SARS-CoV-2 RNA is generally detectable  in upper respiratory specimens during the acute phase of infection. The lowest concentration of SARS-CoV-2 viral copies this assay can detect is 138 copies/mL. A negative result does not preclude SARS-Cov-2 infection and should not be used as the sole basis for treatment or other patient management decisions. A negative result may occur with  improper specimen collection/handling, submission of specimen other than nasopharyngeal swab, presence of viral mutation(s) within the areas targeted by this assay, and inadequate number of viral copies(<138 copies/mL). A negative result must be combined with clinical observations, patient history, and epidemiological information. The expected result is Negative.  Fact Sheet for Patients:  BloggerCourse.com  Fact Sheet for Healthcare Providers:  SeriousBroker.it  This test is no t yet approved or cleared by the [place]  [place] FDA and  has been authorized for detection and/or diagnosis of SARS-CoV-2 by FDA under an Emergency Use Authorization (EUA). This EUA will remain  in effect (meaning this test can be used) for the duration of the COVID-19 declaration under Section 564(b)(1) of the Act, 21 U.S.C.section 360bbb-3(b)(1), unless the authorization is terminated  or revoked sooner.       Influenza A by PCR NEGATIVE NEGATIVE Final   Influenza B by PCR NEGATIVE NEGATIVE Final    Comment: (NOTE) The Xpert Xpress SARS-CoV-2/FLU/RSV plus assay is intended as an aid in the diagnosis of influenza from Nasopharyngeal swab specimens and should not be used as a sole basis for treatment. Nasal washings and aspirates are unacceptable for Xpert Xpress SARS-CoV-2/FLU/RSV testing.  Fact Sheet for Patients: BloggerCourse.com  Fact Sheet for Healthcare Providers: SeriousBroker.it  This test is not yet approved or cleared by the [place]  FDA and has  been authorized for detection and/or diagnosis of SARS-CoV-2 by FDA under an Emergency Use Authorization (EUA). This EUA will remain in effect (meaning this test can be used) for the duration of the COVID-19 declaration under Section 564(b)(1) of the Act, 21 U.S.C. section 360bbb-3(b)(1), unless the authorization is terminated or revoked.     Resp Syncytial Virus by PCR NEGATIVE NEGATIVE Final    Comment: (NOTE) Fact Sheet for Patients: BloggerCourse.com  Fact Sheet for Healthcare Providers: SeriousBroker.it  This test is not yet approved or cleared by the [place]  FDA and has been authorized for detection and/or diagnosis of SARS-CoV-2 by FDA under an Emergency Use Authorization (EUA). This EUA will remain in effect (meaning this test can be used) for the duration of the COVID-19 declaration under Section 564(b)(1) of the Act, 21 U.S.C. section 360bbb-3(b)(1), unless the authorization is terminated or revoked.  Performed at [facility], [address] [place], [place] [address]   Blood Culture ID Panel (Reflexed)     Status: Abnormal   Collection Time: 04/23/24  4:50 AM  Result Value Ref Range Status   Enterococcus faecalis NOT DETECTED NOT DETECTED Final   Enterococcus Faecium NOT DETECTED NOT DETECTED Final   Listeria monocytogenes NOT DETECTED NOT DETECTED Final   Staphylococcus species DETECTED (A) NOT DETECTED Final    Comment: CRITICAL RESULT CALLED TO, READ BACK BY AND VERIFIED WITH: ALEX CHAPPELL @1033  04/24/24 MJU    Staphylococcus aureus (BCID) NOT DETECTED NOT DETECTED Final   Staphylococcus epidermidis DETECTED (A) NOT DETECTED Final    Comment: Methicillin (oxacillin) resistant coagulase negative staphylococcus. Possible blood culture contaminant (unless isolated from more than one blood culture draw or clinical case suggests pathogenicity). No antibiotic treatment is indicated for blood  culture  contaminants. CRITICAL RESULT CALLED TO, READ BACK BY AND VERIFIED WITH: ALEX CHAPPELL @1033  04/24/24 MJU    Staphylococcus lugdunensis NOT DETECTED NOT DETECTED Final   Streptococcus species NOT DETECTED NOT DETECTED Final   Streptococcus agalactiae NOT DETECTED NOT DETECTED Final   Streptococcus pneumoniae NOT DETECTED NOT DETECTED Final   Streptococcus pyogenes NOT DETECTED NOT DETECTED Final   A.calcoaceticus-baumannii NOT DETECTED NOT DETECTED Final   Bacteroides fragilis NOT DETECTED NOT DETECTED Final   Enterobacterales NOT DETECTED NOT DETECTED Final   Enterobacter cloacae complex NOT DETECTED NOT DETECTED Final   Escherichia coli NOT DETECTED NOT DETECTED Final   Klebsiella aerogenes NOT DETECTED NOT DETECTED Final   Klebsiella oxytoca NOT DETECTED NOT DETECTED Final   Klebsiella pneumoniae NOT DETECTED NOT DETECTED Final   Proteus species NOT DETECTED NOT  DETECTED Final   Salmonella species NOT DETECTED NOT DETECTED Final   Serratia marcescens NOT DETECTED NOT DETECTED Final   Haemophilus influenzae NOT DETECTED NOT DETECTED Final   Neisseria meningitidis NOT DETECTED NOT DETECTED Final   Pseudomonas aeruginosa NOT DETECTED NOT DETECTED Final   Stenotrophomonas maltophilia NOT DETECTED NOT DETECTED Final   Candida albicans NOT DETECTED NOT DETECTED Final   Candida auris NOT DETECTED NOT DETECTED Final   Candida glabrata NOT DETECTED NOT DETECTED Final   Candida krusei NOT DETECTED NOT DETECTED Final   Candida parapsilosis NOT DETECTED NOT DETECTED Final   Candida tropicalis NOT DETECTED NOT DETECTED Final   Cryptococcus neoformans/gattii NOT DETECTED NOT DETECTED Final   Methicillin resistance mecA/C DETECTED (A) NOT DETECTED Final    Comment: CRITICAL RESULT CALLED TO, READ BACK BY AND VERIFIED WITH: ALEX CHAPPELL @1033  04/24/24 MJU Performed at [facility], [address] [place], [place] [address]      Labs: BNP (last 3 results) Recent Labs     10/30/23 0426 01/22/24 1243  BNP 110.8* 197.3*   Basic Metabolic Panel: Recent Labs  Lab 04/23/24 0328 04/24/24 0541 04/25/24 0533 04/26/24 0936 04/27/24 0351  NA 134* 138 137 131* 132*  K 4.2 4.1 3.8 4.1 3.7  CL 99 103 102 98 98  CO2 23 25 23 22 22   GLUCOSE 250* 133* 154* 257* 215*  BUN 61* 57* 66* 78* 86*  CREATININE 2.92* 2.83* 3.08* 3.04* 3.12*  CALCIUM  8.6* 9.0 8.8* 8.8* 8.6*  MG  --  2.2  --   --   --    Liver Function Tests: Recent Labs  Lab 04/23/24 0328  AST 19  ALT 16  ALKPHOS 51  BILITOT 0.7  PROT 6.8  ALBUMIN  3.6   Recent Labs  Lab 04/23/24 0513  LIPASE 39   No results for input(s): "AMMONIA" in the last 168 hours. CBC: Recent Labs  Lab 04/23/24 0328 04/24/24 0541 04/25/24 0533 04/26/24 0936 04/27/24 0351  WBC 9.1 7.1 8.3 11.2* 9.9  NEUTROABS 6.5  --   --   --   --   HGB 12.1* 10.6* 10.3* 11.3* 10.5*  HCT 36.0* 32.7* 31.0* 34.9* 31.7*  MCV 94.7 95.9 94.5 95.6 94.6  PLT 274 247 274 264 282   Cardiac Enzymes: No results for input(s): "CKTOTAL", "CKMB", "CKMBINDEX", "TROPONINI" in the last 168 hours. BNP: Invalid input(s): "POCBNP" [facility]G: Recent Labs  Lab 04/26/24 1153 04/26/24 1631 04/26/24 2056 04/27/24 0737 04/27/24 1115  GLUCAP 238* 272* 234* 211* 224*   D-Dimer No results for input(s): "DDIMER" in the last 72 hours. Hgb A1c No results for input(s): "HGBA1C" in the last 72 hours. Lipid Profile No results for input(s): "CHOL", "HDL", "LDLCALC", "TRIG", "CHOLHDL", "LDLDIRECT" in the last 72 hours. Thyroid  function studies No results for input(s): "TSH", "T4TOTAL", "T3FREE", "THYROIDAB" in the last 72 hours.  Invalid input(s): "FREET3" Anemia work up No results for input(s): "VITAMINB12", "FOLATE", "FERRITIN", "TIBC", "IRON ", "RETICCTPCT" in the last 72 hours. Urinalysis    Component Value Date/Time   COLORURINE STRAW (A) 04/23/2024 0450   APPEARANCEUR CLEAR (A) 04/23/2024 0450   LABSPEC 1.010 04/23/2024 0450   PHURINE 6.0  04/23/2024 0450   GLUCOSEU >=500 (A) 04/23/2024 0450   HGBUR NEGATIVE 04/23/2024 0450   BILIRUBINUR NEGATIVE 04/23/2024 0450   BILIRUBINUR 1+ 11/01/2020 1512   KETONESUR NEGATIVE 04/23/2024 0450   PROTEINUR NEGATIVE 04/23/2024 0450   UROBILINOGEN 2.0 (A) 11/01/2020 1512   NITRITE NEGATIVE 04/23/2024 0450  LEUKOCYTESUR NEGATIVE 04/23/2024 0450   Sepsis Labs Recent Labs  Lab 04/24/24 0541 04/25/24 0533 04/26/24 0936 04/27/24 0351  WBC 7.1 8.3 11.2* 9.9   Microbiology Recent Results (from the past 240 hours)  Culture, blood (routine x 2)     Status: None (Preliminary result)   Collection Time: 04/23/24  4:50 AM   Specimen: BLOOD RIGHT ARM  Result Value Ref Range Status   Specimen Description BLOOD RIGHT ARM  Final   Special Requests   Final    BOTTLES DRAWN AEROBIC AND ANAEROBIC Blood Culture adequate volume   Culture   Final    NO GROWTH 4 DAYS Performed at [facility], [address], [place], [place] [address]    Report Status PENDING  Incomplete  Culture, blood (routine x 2)     Status: Abnormal   Collection Time: 04/23/24  4:50 AM   Specimen: BLOOD LEFT ARM  Result Value Ref Range Status   Specimen Description   Final    BLOOD LEFT ARM Performed at [facility], [address] [place], [place] [address]    Special Requests   Final    BOTTLES DRAWN AEROBIC AND ANAEROBIC Blood Culture adequate volume Performed at [facility], [address], [place], [place] [address]    Culture  Setup Time   Final    GRAM POSITIVE COCCI AEROBIC BOTTLE ONLY Organism ID to follow CRITICAL RESULT CALLED TO, READ BACK BY AND VERIFIED WITH: ALEX CHAPPELL @1033  04/24/24 MJU Performed at [facility], [address] [place], [place] [address]    Culture (A)  Final    STAPHYLOCOCCUS EPIDERMIDIS THE SIGNIFICANCE OF ISOLATING THIS ORGANISM FROM A SINGLE SET OF BLOOD CULTURES WHEN MULTIPLE SETS ARE DRAWN IS UNCERTAIN. PLEASE NOTIFY THE  MICROBIOLOGY DEPARTMENT WITHIN ONE WEEK IF SPECIATION AND SENSITIVITIES ARE REQUIRED. Performed at [facility], [address] [place], [place] [address]    Report Status 04/25/2024 FINAL  Final  Resp panel by RT-PCR (RSV, Flu A&B, Covid) Urine, Clean Catch     Status: None   Collection Time: 04/23/24  4:50 AM   Specimen: Urine, Clean Catch; Nasal Swab  Result Value Ref Range Status   SARS Coronavirus 2 by RT PCR NEGATIVE NEGATIVE Final    Comment: (NOTE) SARS-CoV-2 target nucleic acids are NOT DETECTED.  The SARS-CoV-2 RNA is generally detectable in upper respiratory specimens during the acute phase of infection. The lowest concentration of SARS-CoV-2 viral copies this assay can detect is 138 copies/mL. A negative result does not preclude SARS-Cov-2 infection and should not be used as the sole basis for treatment or other patient management decisions. A negative result may occur with  improper specimen collection/handling, submission of specimen other than nasopharyngeal swab, presence of viral mutation(s) within the areas targeted by this assay, and inadequate number of viral copies(<138 copies/mL). A negative result must be combined with clinical observations, patient history, and epidemiological information. The expected result is Negative.  Fact Sheet for Patients:  BloggerCourse.com  Fact Sheet for Healthcare Providers:  SeriousBroker.it  This test is no t yet approved or cleared by the [place]  FDA and  has been authorized for detection and/or diagnosis of SARS-CoV-2 by FDA under an Emergency Use Authorization (EUA). This EUA will remain  in effect (meaning this test can be used) for the duration of the COVID-19 declaration under Section 564(b)(1) of the Act, 21 U.S.C.section 360bbb-3(b)(1), unless the authorization is terminated  or revoked sooner.  Influenza A by PCR NEGATIVE NEGATIVE Final    Influenza B by PCR NEGATIVE NEGATIVE Final    Comment: (NOTE) The Xpert Xpress SARS-CoV-2/FLU/RSV plus assay is intended as an aid in the diagnosis of influenza from Nasopharyngeal swab specimens and should not be used as a sole basis for treatment. Nasal washings and aspirates are unacceptable for Xpert Xpress SARS-CoV-2/FLU/RSV testing.  Fact Sheet for Patients: BloggerCourse.com  Fact Sheet for Healthcare Providers: SeriousBroker.it  This test is not yet approved or cleared by the [place]  FDA and has been authorized for detection and/or diagnosis of SARS-CoV-2 by FDA under an Emergency Use Authorization (EUA). This EUA will remain in effect (meaning this test can be used) for the duration of the COVID-19 declaration under Section 564(b)(1) of the Act, 21 U.S.C. section 360bbb-3(b)(1), unless the authorization is terminated or revoked.     Resp Syncytial Virus by PCR NEGATIVE NEGATIVE Final    Comment: (NOTE) Fact Sheet for Patients: BloggerCourse.com  Fact Sheet for Healthcare Providers: SeriousBroker.it  This test is not yet approved or cleared by the [place]  FDA and has been authorized for detection and/or diagnosis of SARS-CoV-2 by FDA under an Emergency Use Authorization (EUA). This EUA will remain in effect (meaning this test can be used) for the duration of the COVID-19 declaration under Section 564(b)(1) of the Act, 21 U.S.C. section 360bbb-3(b)(1), unless the authorization is terminated or revoked.  Performed at [facility], [address] [place], [place] [address]   Blood Culture ID Panel (Reflexed)     Status: Abnormal   Collection Time: 04/23/24  4:50 AM  Result Value Ref Range Status   Enterococcus faecalis NOT DETECTED NOT DETECTED Final   Enterococcus Faecium NOT DETECTED NOT DETECTED Final   Listeria monocytogenes NOT DETECTED NOT  DETECTED Final   Staphylococcus species DETECTED (A) NOT DETECTED Final    Comment: CRITICAL RESULT CALLED TO, READ BACK BY AND VERIFIED WITH: ALEX CHAPPELL @1033  04/24/24 MJU    Staphylococcus aureus (BCID) NOT DETECTED NOT DETECTED Final   Staphylococcus epidermidis DETECTED (A) NOT DETECTED Final    Comment: Methicillin (oxacillin) resistant coagulase negative staphylococcus. Possible blood culture contaminant (unless isolated from more than one blood culture draw or clinical case suggests pathogenicity). No antibiotic treatment is indicated for blood  culture contaminants. CRITICAL RESULT CALLED TO, READ BACK BY AND VERIFIED WITH: ALEX CHAPPELL @1033  04/24/24 MJU    Staphylococcus lugdunensis NOT DETECTED NOT DETECTED Final   Streptococcus species NOT DETECTED NOT DETECTED Final   Streptococcus agalactiae NOT DETECTED NOT DETECTED Final   Streptococcus pneumoniae NOT DETECTED NOT DETECTED Final   Streptococcus pyogenes NOT DETECTED NOT DETECTED Final   A.calcoaceticus-baumannii NOT DETECTED NOT DETECTED Final   Bacteroides fragilis NOT DETECTED NOT DETECTED Final   Enterobacterales NOT DETECTED NOT DETECTED Final   Enterobacter cloacae complex NOT DETECTED NOT DETECTED Final   Escherichia coli NOT DETECTED NOT DETECTED Final   Klebsiella aerogenes NOT DETECTED NOT DETECTED Final   Klebsiella oxytoca NOT DETECTED NOT DETECTED Final   Klebsiella pneumoniae NOT DETECTED NOT DETECTED Final   Proteus species NOT DETECTED NOT DETECTED Final   Salmonella species NOT DETECTED NOT DETECTED Final   Serratia marcescens NOT DETECTED NOT DETECTED Final   Haemophilus influenzae NOT DETECTED NOT DETECTED Final   Neisseria meningitidis NOT DETECTED NOT DETECTED Final   Pseudomonas aeruginosa NOT DETECTED NOT DETECTED Final   Stenotrophomonas maltophilia NOT DETECTED NOT DETECTED Final   Candida albicans NOT DETECTED NOT DETECTED Final  Candida auris NOT DETECTED NOT DETECTED Final   Candida  glabrata NOT DETECTED NOT DETECTED Final   Candida krusei NOT DETECTED NOT DETECTED Final   Candida parapsilosis NOT DETECTED NOT DETECTED Final   Candida tropicalis NOT DETECTED NOT DETECTED Final   Cryptococcus neoformans/gattii NOT DETECTED NOT DETECTED Final   Methicillin resistance mecA/C DETECTED (A) NOT DETECTED Final    Comment: CRITICAL RESULT CALLED TO, READ BACK BY AND VERIFIED WITH: ALEX CHAPPELL @1033  04/24/24 MJU Performed at [facility], [address], [place], [place] [address]      Time coordinating discharge: Over 30 minutes  SIGNED:   Alphonsus Jeans, MD  [facility] 04/27/2024, 2:06 PM Pager   If 7PM-7AM, please contact night-coverage [email or web address]

## 2024-04-27 NOTE — TOC CM/SW Note (Signed)
     [facility] REFERRAL        Occupational Therapy * Physical Therapy * Speech Therapy                           DATE 04/27/2024  PATIENT NAME Mike Decker  PATIENT MRN [id number]       DIAGNOSIS/DIAGNOSIS CODE R07.9, I20.89  DATE OF DISCHARGE: 04/27/2024       PRIMARY CARE PHYSICIAN  Laurence Pons, NP    PCP PHONE/FAX Phone: [phone]     Dear Provider (Name: [facility]  Fax: [phone]   I certify that I have examined this patient and that occupational/physical/speech therapy is necessary on an outpatient basis.    The patient has expressed interest in completing their recommended course of therapy at your  location.  Once a formal order from the patient's primary care physician has been obtained, please  contact him/her to schedule an appointment for evaluation at your earliest convenience.   [ x]  Physical Therapy Evaluate and Treat  [ x]  Occupational Therapy Evaluate and Treat  [  ]  Speech Therapy Evaluate and Treat         The patient's primary care physician (listed above) must furnish and be responsible for a formal order such that the recommended services may be furnished while under the primary physician's care, and that the plan of care will be established and reviewed every 30 days (or more often if condition necessitates).

## 2024-04-28 ENCOUNTER — Telehealth: Payer: Self-pay | Admitting: Nurse Practitioner

## 2024-04-28 ENCOUNTER — Encounter (INDEPENDENT_AMBULATORY_CARE_PROVIDER_SITE_OTHER): Payer: Self-pay

## 2024-04-28 LAB — BASIC METABOLIC PANEL WITH GFR
Anion gap: 11 (ref 5–15)
BUN: 83 mg/dL — ABNORMAL HIGH (ref 8–23)
CO2: 21 mmol/L — ABNORMAL LOW (ref 22–32)
Calcium: 8.5 mg/dL — ABNORMAL LOW (ref 8.9–10.3)
Chloride: 102 mmol/L (ref 98–111)
Creatinine, Ser: 2.88 mg/dL — ABNORMAL HIGH (ref 0.61–1.24)
GFR, Estimated: 21 mL/min — ABNORMAL LOW (ref >60)
Glucose, Bld: 136 mg/dL — ABNORMAL HIGH (ref 70–99)
Potassium: 3.9 mmol/L (ref 3.5–5.1)
Sodium: 134 mmol/L — ABNORMAL LOW (ref 135–145)

## 2024-04-28 LAB — GLUCOSE, CAPILLARY: Glucose-Capillary: 189 mg/dL — ABNORMAL HIGH (ref 70–99)

## 2024-04-28 LAB — CBC
HCT: 30.6 % — ABNORMAL LOW (ref 39.0–52.0)
Hemoglobin: 10.1 g/dL — ABNORMAL LOW (ref 13.0–17.0)
MCH: 31.3 pg (ref 26.0–34.0)
MCHC: 33 g/dL (ref 30.0–36.0)
MCV: 94.7 fL (ref 80.0–100.0)
Platelets: 282 10*3/uL (ref 150–400)
RBC: 3.23 MIL/uL — ABNORMAL LOW (ref 4.22–5.81)
RDW: 13.2 % (ref 11.5–15.5)
WBC: 10.2 10*3/uL (ref 4.0–10.5)
nRBC: 0 % (ref 0.0–0.2)

## 2024-04-28 LAB — CULTURE, BLOOD (ROUTINE X 2)
Culture: NO GROWTH
Special Requests: ADEQUATE

## 2024-04-28 MED ORDER — DILTIAZEM HCL 30 MG PO TABS: 30.0000 mg | ORAL_TABLET | Freq: Three times a day (TID) | ORAL | 0 refills | Status: DC

## 2024-04-28 MED ORDER — NITROGLYCERIN 0.4 MG/HR TD PT24
0.4000 mg | MEDICATED_PATCH | Freq: Every day | TRANSDERMAL | Status: DC
  Filled 2024-04-28: qty 1

## 2024-04-28 NOTE — Progress Notes (Signed)
 [facility] CLINIC CARDIOLOGY PROGRESS NOTE       Patient ID: Mike Decker MRN: [id number] DOB/AGE: 05-18-1941 83 y.o.  Admit date: 04/23/2024 Referring Physician Dr. Antoniette Batty Primary Physician Laurence Pons, NP  Primary Cardiologist Dr. Beau Bound Reason for Consultation chest pain  HPI: Mike Decker is a 83 y.o. male  with a past medical history of CAD s/p PCI w/ DES RCA, prox LCx, jailed OM 2 but good flow, HFpEF (EF 55-60% 08/2022), mild to moderate AS (mean grad 19 mmHg), CKD 4, type 2 diabetes, carotid stenosis, COPD, OSA on CPAP, Barrett's esophagus, history of prostate cancer  who presented to the ED on 04/23/2024 for chest pain. Concern for worsening of AS. Cardiology was consulted for further evaluation.   Interval History: -Patient seen and examined this AM and laying in hospital bed. Patient denies recurrence of chest pain or SOB since yesterday.  -Patients BP elevated and HR stable this AM.  -Patient remains on room air with stable SpO2.    Pertinent Cardiac History: Cardiac PET Perfusion Test (01/02/2024):  1. No acute CT findings of the included chest. 2. Dense aortic valve calcifications. Correlate for echocardiographic evidence of aortic valve dysfunction. 3. Extensive three vessel coronary artery calcifications.  LHC (12/13/2022) Left ventriculogram preserved left ventricular function EF around 55 to 60% mild left ventricular enlargement  Left main large minor irregularities  LAD large with diffuse minor irregularities and a 50% mid lesion distal 50-75 Circumflex medium to large minor irregularities widely patent proximal stent OM1 diffusely diseased 50-75 small vessel RCA large proximal stent widely patent 50% mid lesion mixed dominant All vessels with TIMI-3 flow Intervention deferred not indicated   Review of systems complete and found to be negative unless listed above    Past Medical History:  Diagnosis Date   Anemia    Arthritis     Atrioventricular canal (AVC)    irregular heart beats   Barrett esophagus    Bronchiolitis    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, esphageal   Chronic diastolic CHF (congestive heart failure) (HCC)    Colon polyp    Diabetes mellitus without complication (HCC)    Diverticulosis    Gout    Heart disease    Hemangioma    liver   Hyperlipidemia    Hypertension    Myocardial infarct [facility])    Ocular hypertension    Peripheral vascular disease (HCC)    Skin cancer    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HCC)    Sleep apnea    Vitreoretinal degeneration     Past Surgical History:  Procedure Laterality Date   CATARACT EXTRACTION  2011, 2012   COLONOSCOPY N/A 12/14/2018   Procedure: COLONOSCOPY;  Surgeon: Irby Mannan, MD;  Location: [facility] ENDOSCOPY;  Service: Endoscopy;  Laterality: N/A;   CORONARY ANGIOPLASTY WITH STENT PLACEMENT  12/10/2010   CORONARY STENT INTERVENTION N/A 12/29/2019   Procedure: CORONARY STENT INTERVENTION;  Surgeon: Sammy Crisp, MD;  Location: [facility] INVASIVE CV LAB;  Service: Cardiovascular;  Laterality: N/A;   ESOPHAGOGASTRODUODENOSCOPY N/A 12/14/2018   Procedure: ESOPHAGOGASTRODUODENOSCOPY (EGD);  Surgeon: Irby Mannan, MD;  Location: [facility] ENDOSCOPY;  Service: Endoscopy;  Laterality: N/A;   ESOPHAGOGASTRODUODENOSCOPY (EGD) WITH PROPOFOL  N/A 06/01/2016   Procedure: ESOPHAGOGASTRODUODENOSCOPY (EGD) WITH PROPOFOL ;  Surgeon: Cassie Click, MD;  Location: [facility] ENDOSCOPY;  Service: Endoscopy;  Laterality: N/A;   ESOPHAGOGASTRODUODENOSCOPY (EGD) WITH PROPOFOL  N/A 01/31/2023   Procedure: ESOPHAGOGASTRODUODENOSCOPY (EGD) WITH PROPOFOL ;  Surgeon: Marnee Sink, MD;  Location: [facility] ENDOSCOPY;  Service: Endoscopy;  Laterality: N/A;   FEMORAL ARTERY STENT Right    HERNIA REPAIR     x2   INTRAOCULAR LENS INSERTION     LEFT HEART CATH AND CORONARY ANGIOGRAPHY N/A 05/09/2017   Procedure: Left Heart Cath and Coronary Angiography;  Surgeon: Antonette Batters, MD;  Location: [facility]  INVASIVE CV LAB;  Service: Cardiovascular;  Laterality: N/A;   LEFT HEART CATH AND CORONARY ANGIOGRAPHY N/A 12/08/2018   Procedure: LEFT HEART CATH AND CORONARY ANGIOGRAPHY and possible PCI and stent;  Surgeon: Antonette Batters, MD;  Location: [facility] INVASIVE CV LAB;  Service: Cardiovascular;  Laterality: N/A;   LEFT HEART CATH AND CORONARY ANGIOGRAPHY N/A 12/29/2019   Procedure: LEFT HEART CATH AND CORONARY ANGIOGRAPHY;  Surgeon: Sammy Crisp, MD;  Location: [facility] INVASIVE CV LAB;  Service: Cardiovascular;  Laterality: N/A;   LEFT HEART CATH AND CORONARY ANGIOGRAPHY Left 12/13/2022   Procedure: LEFT HEART CATH AND CORONARY ANGIOGRAPHY;  Surgeon: Antonette Batters, MD;  Location: [facility] INVASIVE CV LAB;  Service: Cardiovascular;  Laterality: Left;   NOSE SURGERY     submucous resection   PROSTATE SURGERY  12/10/2000   ROTATOR CUFF REPAIR Right     Medications Prior to Admission  Medication Sig Dispense Refill Last Dose/Taking   acetaminophen  (TYLENOL ) 500 MG tablet Take 1,000 mg by mouth every 8 (eight) hours as needed for mild pain.   Taking As Needed   atorvastatin  (LIPITOR ) 80 MG tablet Take 1 tablet (80 mg total) by mouth daily. 30 tablet 0 04/22/2024   azelastine  (ASTELIN ) 0.1 % nasal spray USE 1 TO 2 SPRAYS IN EACH NOSTRIL TWICE A DAY   04/22/2024   calcitRIOL  (ROCALTROL ) 0.25 MCG capsule Take 0.25 mcg by mouth daily.   04/22/2024   cetirizine (ZYRTEC) 5 MG tablet Take 5 mg by mouth daily.   Taking   citalopram  (CELEXA ) 10 MG tablet Take 1 tablet (10 mg total) by mouth daily. 90 tablet 3 04/22/2024   clopidogrel  (PLAVIX ) 75 MG tablet TAKE 1 TABLET BY MOUTH EVERY DAY 90 tablet 1 04/22/2024   [employer] VITAMIN B12 1000 MCG tablet TAKE 1 TABLET BY MOUTH EVERY DAY 30 tablet 0 04/22/2024   docusate sodium  (COLACE) 100 MG capsule Take 100 mg by mouth 2 (two) times daily.   04/22/2024   EPINEPHrine  (EPI-PEN) 0.3 mg/0.3 mL SOAJ injection Inject into the muscle as needed (anaphylaxis).   Taking As Needed    ergocalciferol  (VITAMIN D2) 1.25 MG (50000 UT) capsule Take 50,000 Units by mouth every 30 (thirty) days.   04/01/2024   ferrous sulfate  325 (65 FE) MG EC tablet TAKE 1 TABLET BY MOUTH EVERY DAY 90 tablet 3 04/22/2024   fluticasone  (FLONASE ) 50 MCG/ACT nasal spray USE 1 SPRAY IN EACH NOSTRIL EVERY DAY 32 g 6 Past Week   hydrALAZINE  (APRESOLINE ) 50 MG tablet Take 25 mg by mouth 2 (two) times daily.   Taking   insulin  aspart (NOVOLOG ) 100 UNIT/ML FlexPen 14 UNITS IN AM, 30 UNITS AT LUNCH, 30 UNITS AT [facility]   04/22/2024   insulin  glargine (LANTUS ) 100 UNIT/ML injection Inject 0.35 mLs (35 Units total) into the skin at bedtime. This is a decrease from your previous 45 units nightly. (Patient taking differently: Inject 60 Units into the skin at bedtime.) 10 mL 11 04/22/2024   isosorbide  mononitrate (IMDUR ) 120 MG 24 hr tablet Take 1 tablet (120 mg total) by mouth 2 (two) times daily. 60 tablet 0 04/22/2024   JARDIANCE 10 MG TABS tablet  Take 10 mg by mouth daily.   04/22/2024   magnesium  oxide (MAG-OX) 400 (240 Mg) MG tablet Take 1 tablet by mouth daily.   04/22/2024   metoprolol  succinate (TOPROL -XL) 50 MG 24 hr tablet Take 1 tablet by mouth daily.   04/22/2024   mometasone  (ASMANEX ) 220 MCG/ACT inhaler Inhale 2 puffs into the lungs daily.   04/22/2024   montelukast  (SINGULAIR ) 10 MG tablet TAKE 1 TABLET AT BEDTIME 90 tablet 3 04/22/2024   nitroGLYCERIN  (NITRODUR - DOSED IN MG/24 HR) 0.4 mg/hr patch Place 0.4 mg onto the skin daily.   04/22/2024   nitroGLYCERIN  (NITROSTAT ) 0.4 MG SL tablet Place 0.4 mg under the tongue every 5 (five) minutes x 3 doses as needed for chest pain.   Taking As Needed   pantoprazole  (PROTONIX ) 40 MG tablet Take 1 tablet (40 mg total) by mouth 2 (two) times daily before a meal.   04/22/2024   ranolazine  (RANEXA ) 500 MG 12 hr tablet Take 1 tablet by mouth 2 (two) times daily.   04/22/2024   Tiotropium Bromide-Olodaterol (STIOLTO RESPIMAT) 2.5-2.5 MCG/ACT AERS Inhale 1 each into the lungs  daily.   04/22/2024   torsemide  (DEMADEX ) 20 MG tablet Take 40 mg by mouth 2 (two) times daily.   04/22/2024   folic acid  (FOLVITE ) 400 MCG tablet Take 400 mcg by mouth daily. (Patient not taking: Reported on 04/23/2024)   Not Taking   sodium fluoride  (DENTA 5000 PLUS) 1.1 % CREA dental cream See admin instructions.      Social History   Socioeconomic History   Marital status: Married    Spouse name: Not on file   Number of children: 2   Years of education: College 3 years   Highest education level: Associate degree: occupational, Scientist, product/process development, or vocational program  Occupational History   Occupation: retired  Tobacco Use   Smoking status: Former    Current packs/day: 0.00    Types: Cigarettes    Start date: 12/10/1956    Quit date: 12/10/1976    Years since quitting: 47.4   Smokeless tobacco: Never  Vaping Use   Vaping status: Never Used  Substance and Sexual Activity   Alcohol use: No   Drug use: No   Sexual activity: Not Currently  Other Topics Concern   Not on file  Social History Narrative   Not on file   Social Drivers of Health   Financial Resource Strain: Low Risk  (06/21/2023)   Received from [facility]   Overall Financial Resource Strain (CARDIA)    Difficulty of Paying Living Expenses: Not very hard  Food Insecurity: No Food Insecurity (04/23/2024)   Hunger Vital Sign    Worried About Running Out of Food in the Last Year: Never true    Ran Out of Food in the Last Year: Never true  Transportation Needs: No Transportation Needs (04/23/2024)   PRAPARE - Administrator, Civil Service (Medical): No    Lack of Transportation (Non-Medical): No  Physical Activity: Insufficiently Active (09/15/2021)   Exercise Vital Sign    Days of Exercise per Week: 7 days    Minutes of Exercise per Session: 20 min  Stress: No Stress Concern Present (09/15/2021)   [employer] of Occupational Health - Occupational Stress Questionnaire    Feeling of Stress :  Not at all  Social Connections: Moderately Isolated (04/23/2024)   Social Connection and Isolation Panel [NHANES]    Frequency of Communication with Friends and Family: Once a  week    Frequency of Social Gatherings with Friends and Family: Once a week    Attends Religious Services: More than 4 times per year    Active Member of Clubs or Organizations: No    Attends Banker Meetings: Never    Marital Status: Married  Catering manager Violence: Not At Risk (04/23/2024)   Humiliation, Afraid, Rape, and Kick questionnaire    Fear of Current or Ex-Partner: No    Emotionally Abused: No    Physically Abused: No    Sexually Abused: No    Family History  Problem Relation Age of Onset   Arthritis Mother    Stroke Maternal Grandfather    Breast cancer Neg Hx      Vitals:   04/27/24 2332 04/28/24 0430 04/28/24 0743 04/28/24 0818  BP: (!) 113/55 (!) 107/37  (!) 146/55  Pulse: 64 (!) 58  (!) 58  Resp: 18 20    Temp: 98.3 F (36.8 C) 98 F (36.7 C)  98 F (36.7 C)  TempSrc: Oral     SpO2: 99% 97% 98% 100%  Weight:      Height:        PHYSICAL EXAM General: Chronically ill appearing male, well nourished, in no acute distress. HEENT: Normocephalic and atraumatic. Neck: No JVD.  Lungs: Normal respiratory effort on room air. Clear bilaterally to auscultation. No wheezes, crackles, rhonchi.  Heart: HRRR. Normal S1 and S2 without gallops. + systolic murmur.  Abdomen: Non-distended appearing.  Msk: Normal strength and tone for age. Extremities: Warm and well perfused. No clubbing, cyanosis. No edema.  Neuro: Alert and oriented X 3. Psych: Answers questions appropriately.   Labs: Basic Metabolic Panel: Recent Labs    04/27/24 0351 04/28/24 0341  NA 132* 134*  K 3.7 3.9  CL 98 102  CO2 22 21*  GLUCOSE 215* 136*  BUN 86* 83*  CREATININE 3.12* 2.88*  CALCIUM  8.6* 8.5*   Liver Function Tests: No results for input(s): "AST", "ALT", "ALKPHOS", "BILITOT", "PROT",  "ALBUMIN " in the last 72 hours.  No results for input(s): "LIPASE", "AMYLASE" in the last 72 hours.  CBC: Recent Labs    04/27/24 0351 04/28/24 0341  WBC 9.9 10.2  HGB 10.5* 10.1*  HCT 31.7* 30.6*  MCV 94.6 94.7  PLT 282 282   Cardiac Enzymes: No results for input(s): "CKTOTAL", "CKMB", "CKMBINDEX", "TROPONINIHS" in the last 72 hours.  BNP: No results for input(s): "BNP" in the last 72 hours. D-Dimer: No results for input(s): "DDIMER" in the last 72 hours. Hemoglobin A1C: No results for input(s): "HGBA1C" in the last 72 hours. Fasting Lipid Panel: No results for input(s): "CHOL", "HDL", "LDLCALC", "TRIG", "CHOLHDL", "LDLDIRECT" in the last 72 hours.  Thyroid  Function Tests: No results for input(s): "TSH", "T4TOTAL", "T3FREE", "THYROIDAB" in the last 72 hours.  Invalid input(s): "FREET3" Anemia Panel: No results for input(s): "VITAMINB12", "FOLATE", "FERRITIN", "TIBC", "IRON ", "RETICCTPCT" in the last 72 hours.   Radiology: [facility] Chest Port 1 View Result Date: 04/26/2024 CLINICAL DATA:  [id number] SOB (shortness of breath) [id number] EXAM: PORTABLE CHEST 1 VIEW COMPARISON:  Apr 23, 2024, December 16, 2019 FINDINGS: The cardiomediastinal silhouette is unchanged and enlarged in contour. No pleural effusion. No pneumothorax. No acute pleuroparenchymal abnormality. IMPRESSION: No acute cardiopulmonary abnormality. Electronically Signed   By: Clancy Crimes M.D.   On: 04/26/2024 16:59   ECHOCARDIOGRAM COMPLETE Result Date: 04/24/2024    ECHOCARDIOGRAM REPORT   Patient Name:   Mike Decker Date of Exam: 04/24/2024  Medical Rec #:  [id number]            Height:       66.0 in Accession #:    [id number]           Weight:       180.0 lb Date of Birth:  [birth year]            BSA:          1.912 m Patient Age:    82 years             BP:           115/52 mmHg Patient Gender: M                    HR:           64 bpm. Exam Location:  [facility] Procedure: 2D Echo, Cardiac Doppler and Color Doppler  (Both Spectral and Color            Flow Doppler were utilized during procedure). Indications:     Aortic stenosis I35.0  History:         Patient has prior history of Echocardiogram examinations, most                  recent 04/18/2023. Risk Factors:Hypertension, Diabetes and                  Dyslipidemia.  Sonographer:     Broadus Canes Referring Phys:  [id number] CARALYN HUDSON Diagnosing Phys: Sabina Custovic IMPRESSIONS  1. Left ventricular ejection fraction, by estimation, is 60 to 65%. The left ventricle has normal function. The left ventricle has no regional wall motion abnormalities. Left ventricular diastolic parameters are consistent with Grade II diastolic dysfunction (pseudonormalization).  2. Right ventricular systolic function is normal. The right ventricular size is normal.  3. Left atrial size was mildly dilated.  4. The mitral valve is normal in structure. Mild mitral valve regurgitation. No evidence of mitral stenosis.  5. The aortic valve is calcified. Aortic valve regurgitation is not visualized. Mild to moderate aortic valve stenosis. Aortic valve area, by VTI measures 1.20 cm. Aortic valve mean gradient measures 25.3 mmHg. Aortic valve Vmax measures 3.16 m/s.  6. The inferior vena cava is normal in size with greater than 50% respiratory variability, suggesting right atrial pressure of 3 mmHg. FINDINGS  Left Ventricle: Left ventricular ejection fraction, by estimation, is 60 to 65%. The left ventricle has normal function. The left ventricle has no regional wall motion abnormalities. The left ventricular internal cavity size was normal in size. There is  no left ventricular hypertrophy. Left ventricular diastolic parameters are consistent with Grade II diastolic dysfunction (pseudonormalization). Right Ventricle: The right ventricular size is normal. No increase in right ventricular wall thickness. Right ventricular systolic function is normal. Left Atrium: Left atrial size was mildly dilated. Right  Atrium: Right atrial size was normal in size. Pericardium: There is no evidence of pericardial effusion. Mitral Valve: The mitral valve is normal in structure. Mild mitral valve regurgitation. No evidence of mitral valve stenosis. MV peak gradient, 7.5 mmHg. The mean mitral valve gradient is 3.0 mmHg. Tricuspid Valve: The tricuspid valve is normal in structure. Tricuspid valve regurgitation is trivial. Aortic Valve: The aortic valve is calcified. Aortic valve regurgitation is not visualized. Mild to moderate aortic stenosis is present. Aortic valve mean gradient measures 25.3 mmHg. Aortic valve peak gradient measures 39.9 mmHg. Aortic valve area, by VTI measures 1.20 cm.  Pulmonic Valve: The pulmonic valve was normal in structure. Pulmonic valve regurgitation is not visualized. Aorta: The aortic root is normal in size and structure. Venous: The inferior vena cava is normal in size with greater than 50% respiratory variability, suggesting right atrial pressure of 3 mmHg. IAS/Shunts: No atrial level shunt detected by color flow Doppler.  LEFT VENTRICLE PLAX 2D LVIDd:         4.60 cm   Diastology LVIDs:         3.00 cm   LV e' medial:    4.79 cm/s LV PW:         1.00 cm   LV E/e' medial:  16.6 LV IVS:        1.30 cm   LV e' lateral:   6.42 cm/s LVOT diam:     2.20 cm   LV E/e' lateral: 12.4 LV SV:         99 LV SV Index:   52 LVOT Area:     3.80 cm  RIGHT VENTRICLE RV Basal diam:  2.60 cm RV Mid diam:    2.70 cm RV S prime:     13.50 cm/s TAPSE (M-mode): 2.2 cm LEFT ATRIUM             Index        RIGHT ATRIUM          Index LA diam:        4.10 cm 2.14 cm/m   RA Area:     9.12 cm LA Vol (A2C):   70.3 ml 36.76 ml/m  RA Volume:   16.80 ml 8.79 ml/m LA Vol (A4C):   55.6 ml 29.08 ml/m LA Biplane Vol: 62.8 ml 32.84 ml/m  AORTIC VALVE AV Area (Vmax):    1.06 cm AV Area (Vmean):   0.94 cm AV Area (VTI):     1.20 cm AV Vmax:           315.67 cm/s AV Vmean:          240.667 cm/s AV VTI:            0.822 m AV Peak  Grad:      39.9 mmHg AV Mean Grad:      25.3 mmHg LVOT Vmax:         87.80 cm/s LVOT Vmean:        59.300 cm/s LVOT VTI:          0.260 m LVOT/AV VTI ratio: 0.32  AORTA Ao Root diam: 3.20 cm MITRAL VALVE                TRICUSPID VALVE MV Area (PHT): 3.06 cm     TR Peak grad:   20.2 mmHg MV Area VTI:   3.32 cm     TR Vmax:        225.00 cm/s MV Peak grad:  7.5 mmHg MV Mean grad:  3.0 mmHg     SHUNTS MV Vmax:       1.37 m/s     Systemic VTI:  0.26 m MV Vmean:      79.0 cm/s    Systemic Diam: 2.20 cm MV Decel Time: 248 msec MV E velocity: 79.70 cm/s MV A velocity: 127.00 cm/s MV E/A ratio:  0.63 Sabina Custovic Electronically signed by Isabell Manzanilla Signature Date/Time: 04/24/2024/11:59:28 AM    Final    DG Chest Port 1 View Result Date: 04/23/2024 CLINICAL DATA:  Chest pain EXAM: PORTABLE CHEST 1 VIEW COMPARISON:  11/19/2023 FINDINGS: The heart size and mediastinal contours are within normal limits. Both lungs are clear. The visualized skeletal structures are unremarkable. IMPRESSION: No active disease. Electronically Signed   By: Juanetta Nordmann M.D.   On: 04/23/2024 04:00    ECHO as above  TELEMETRY reviewed by me 04/28/2024: sinus rhythm with PVCs, rate 60s  EKG reviewed by me: NSR rate 65 bpm  Data reviewed by me 04/28/2024: last 24h vitals tele labs imaging I/O ED provider note, admission H&P  Principal Problem:   Chest pain Active Problems:   Angina at rest [facility])    ASSESSMENT AND PLAN:  Mike Decker is a 83 y.o. male  with a past medical history of CAD s/p PCI w/ DES RCA, prox LCx, jailed OM 2 but good flow, HFpEF (EF 55-60% 08/2022), mild to moderate AS (mean grad 19 mmHg), CKD 4, type 2 diabetes, carotid stenosis, COPD, OSA on CPAP, Barrett's esophagus, history of prostate cancer  who presented to the ED on 04/23/2024 for chest pain. Concern for worsening of AS. Cardiology was consulted for further evaluation.   # Chronic angina # Coronary artery disease s/p multiple prior  stents # Moderate aortic stenosis Patient with history of chronic angina presenting with CP which began overnight, took 3 NTG at home and full dose ASA with minimal improvement. CP improved at this time. Troponins 24 > 37. EKG stable from prior, nonacute. Echo this admission with pEF, no RWMA, grade II diastolic dysfunction, mild MR, mild to moderate AS. Per tele overnight had 8 beats of VT.  -Continue diltiazem  30 mg TID. HOLD SBP < 100, HR < 50. -Continue metoprolol  succinate to 100 mg daily. Continue imdur  120 mg twice daily, ranexa  500 mg twice daily (max dose given renal function).  -Continue lipitor  80 mg daily, plavix  75 mg daily.  -Minimally elevated troponin most consistent with demand/supply mismatch and not ACS.  -Resume home nitrodur patch at discharge. 12 hrs on/12 hrs off for persistent anginal sxs.   # Chronic HFpEF Appears euvolemic on exam. CXR without evidence of edema.  -Continue home meds as prescribed.   # Paroxysmal atrial fibrillation Patient with AF not on anticoagulation due to bleeding issues. Remaining in NSR on tele.  -See plan above for metoprolol .    # CKD stage IV Patient with chronic kidney disease with baseline Cr 2.5-3.0, now with Cr 2.92 on presentation.  -Continue close follow up with nephrology.  Ok for discharge today from a cardiac perspective. Will arrange for follow up in clinic with Dr. Beau Bound in 1-2 weeks.    This patient's plan of care was discussed and created with Dr. Beau Bound and he is in agreement.  Signed: Creighton Doffing, PA-C  04/28/2024, 9:37 AM [facility] Cardiology

## 2024-04-28 NOTE — Telephone Encounter (Addendum)
 Received PT & OT orders from [facility]. Gave to Alyssa for [employer]. Faxed back to [facility]; [phone]. Scanned-Toni

## 2024-04-28 NOTE — Plan of Care (Signed)
  Problem: Education: Goal: Knowledge of General Education information will improve Description: Including pain rating scale, medication(s)/side effects and non-pharmacologic comfort measures Outcome: Progressing   Problem: Clinical Measurements: Goal: Ability to maintain clinical measurements within normal limits will improve Outcome: Progressing   Problem: Clinical Measurements: Goal: Will remain free from infection Outcome: Progressing   Problem: Clinical Measurements: Goal: Diagnostic test results will improve Outcome: Progressing   Problem: Coping: Goal: Ability to adjust to condition or change in health will improve Outcome: Progressing   Problem: Health Behavior/Discharge Planning: Goal: Ability to identify and utilize available resources and services will improve Outcome: Progressing    Plan of care ongoing, see, MAR see flowsheet

## 2024-04-28 NOTE — Progress Notes (Signed)
 [facility]  ROUNDING NOTE   Subjective:   Patient seen sitting at side of bed Denies chest pain Denies shortness of breath Tolerating meals.    Objective:  Vital signs in last 24 hours:  Temp:  [97.7 F (36.5 C)-98.3 F (36.8 C)] 98 F (36.7 C) (05/20 0818) Pulse Rate:  [57-64] 58 (05/20 0818) Resp:  [18-20] 20 (05/20 0430) BP: (107-146)/(37-55) 146/55 (05/20 0818) SpO2:  [97 %-100 %] 100 % (05/20 0818) FiO2 (%):  [21 %] 21 % (05/19 2319)  Weight change:  Filed Weights   04/23/24 0331  Weight: 81.6 kg    Intake/Output: I/O last 3 completed shifts: In: 730.4 [P.O.:480; IV Piggyback:250.4] Out: 2100 [Urine:2100]   Intake/Output this shift:  No intake/output data recorded.  Physical Exam: General: NAD  Head: Normocephalic, atraumatic. Moist oral mucosal membranes  Eyes: Anicteric  Lungs:  Clear to auscultation, normal effort  Heart: Regular rate and rhythm  Abdomen:  Soft, nontender  Extremities:  No peripheral edema.  Neurologic: Alert, moving all four extremities  Skin: No lesions       Basic Metabolic Panel: Recent Labs  Lab 04/24/24 0541 04/25/24 0533 04/26/24 0936 04/27/24 0351 04/28/24 0341  NA 138 137 131* 132* 134*  K 4.1 3.8 4.1 3.7 3.9  CL 103 102 98 98 102  CO2 25 23 22 22  21*  GLUCOSE 133* 154* 257* 215* 136*  BUN 57* 66* 78* 86* 83*  CREATININE 2.83* 3.08* 3.04* 3.12* 2.88*  CALCIUM  9.0 8.8* 8.8* 8.6* 8.5*  MG 2.2  --   --   --   --     Liver Function Tests: Recent Labs  Lab 04/23/24 0328  AST 19  ALT 16  ALKPHOS 51  BILITOT 0.7  PROT 6.8  ALBUMIN  3.6   Recent Labs  Lab 04/23/24 0513  LIPASE 39   No results for input(s): "AMMONIA" in the last 168 hours.  CBC: Recent Labs  Lab 04/23/24 0328 04/24/24 0541 04/25/24 0533 04/26/24 0936 04/27/24 0351 04/28/24 0341  WBC 9.1 7.1 8.3 11.2* 9.9 10.2  NEUTROABS 6.5  --   --   --   --   --   HGB 12.1* 10.6* 10.3* 11.3* 10.5* 10.1*  HCT 36.0* 32.7* 31.0*  34.9* 31.7* 30.6*  MCV 94.7 95.9 94.5 95.6 94.6 94.7  PLT 274 247 274 264 282 282    Cardiac Enzymes: No results for input(s): "CKTOTAL", "CKMB", "CKMBINDEX", "TROPONINI" in the last 168 hours.  BNP: Invalid input(s): "POCBNP"  CBG: Recent Labs  Lab 04/27/24 0737 04/27/24 1115 04/27/24 1633 04/27/24 2155 04/28/24 0830  GLUCAP 211* 224* 173* 259* 189*    Microbiology: Results for orders placed or performed during the hospital encounter of 04/23/24  Culture, blood (routine x 2)     Status: None   Collection Time: 04/23/24  4:50 AM   Specimen: BLOOD RIGHT ARM  Result Value Ref Range Status   Specimen Description BLOOD RIGHT ARM  Final   Special Requests   Final    BOTTLES DRAWN AEROBIC AND ANAEROBIC Blood Culture adequate volume   Culture   Final    NO GROWTH 5 DAYS Performed at [facility], [address], [place], [place] [address]    Report Status 04/28/2024 FINAL  Final  Culture, blood (routine x 2)     Status: Abnormal   Collection Time: 04/23/24  4:50 AM   Specimen: BLOOD LEFT ARM  Result Value Ref Range Status   Specimen Description  Final    BLOOD LEFT ARM Performed at [facility], [address], [place], [place] [address]    Special Requests   Final    BOTTLES DRAWN AEROBIC AND ANAEROBIC Blood Culture adequate volume Performed at [facility], [address] [place], [place] [address]    Culture  Setup Time   Final    GRAM POSITIVE COCCI AEROBIC BOTTLE ONLY Organism ID to follow CRITICAL RESULT CALLED TO, READ BACK BY AND VERIFIED WITH: ALEX CHAPPELL @1033  04/24/24 MJU Performed at [facility], [address] [place], [place] [address]    Culture (A)  Final    STAPHYLOCOCCUS EPIDERMIDIS THE SIGNIFICANCE OF ISOLATING THIS ORGANISM FROM A SINGLE SET OF BLOOD CULTURES WHEN MULTIPLE SETS ARE DRAWN IS UNCERTAIN. PLEASE NOTIFY THE MICROBIOLOGY DEPARTMENT WITHIN ONE WEEK IF SPECIATION AND SENSITIVITIES ARE  REQUIRED. Performed at [facility], [address] [place], [place] [address]    Report Status 04/25/2024 FINAL  Final  Resp panel by RT-PCR (RSV, Flu A&B, Covid) Urine, Clean Catch     Status: None   Collection Time: 04/23/24  4:50 AM   Specimen: Urine, Clean Catch; Nasal Swab  Result Value Ref Range Status   SARS Coronavirus 2 by RT PCR NEGATIVE NEGATIVE Final    Comment: (NOTE) SARS-CoV-2 target nucleic acids are NOT DETECTED.  The SARS-CoV-2 RNA is generally detectable in upper respiratory specimens during the acute phase of infection. The lowest concentration of SARS-CoV-2 viral copies this assay can detect is 138 copies/mL. A negative result does not preclude SARS-Cov-2 infection and should not be used as the sole basis for treatment or other patient management decisions. A negative result may occur with  improper specimen collection/handling, submission of specimen other than nasopharyngeal swab, presence of viral mutation(s) within the areas targeted by this assay, and inadequate number of viral copies(<138 copies/mL). A negative result must be combined with clinical observations, patient history, and epidemiological information. The expected result is Negative.  Fact Sheet for Patients:  BloggerCourse.com  Fact Sheet for Healthcare Providers:  SeriousBroker.it  This test is no t yet approved or cleared by the [place]  FDA and  has been authorized for detection and/or diagnosis of SARS-CoV-2 by FDA under an Emergency Use Authorization (EUA). This EUA will remain  in effect (meaning this test can be used) for the duration of the COVID-19 declaration under Section 564(b)(1) of the Act, 21 U.S.C.section 360bbb-3(b)(1), unless the authorization is terminated  or revoked sooner.       Influenza A by PCR NEGATIVE NEGATIVE Final   Influenza B by PCR NEGATIVE NEGATIVE Final    Comment: (NOTE) The Xpert Xpress  SARS-CoV-2/FLU/RSV plus assay is intended as an aid in the diagnosis of influenza from Nasopharyngeal swab specimens and should not be used as a sole basis for treatment. Nasal washings and aspirates are unacceptable for Xpert Xpress SARS-CoV-2/FLU/RSV testing.  Fact Sheet for Patients: BloggerCourse.com  Fact Sheet for Healthcare Providers: SeriousBroker.it  This test is not yet approved or cleared by the [place]  FDA and has been authorized for detection and/or diagnosis of SARS-CoV-2 by FDA under an Emergency Use Authorization (EUA). This EUA will remain in effect (meaning this test can be used) for the duration of the COVID-19 declaration under Section 564(b)(1) of the Act, 21 U.S.C. section 360bbb-3(b)(1), unless the authorization is terminated or revoked.     Resp Syncytial Virus by PCR NEGATIVE NEGATIVE Final    Comment: (NOTE) Fact Sheet for  Patients: BloggerCourse.com  Fact Sheet for Healthcare Providers: SeriousBroker.it  This test is not yet approved or cleared by the [place]  FDA and has been authorized for detection and/or diagnosis of SARS-CoV-2 by FDA under an Emergency Use Authorization (EUA). This EUA will remain in effect (meaning this test can be used) for the duration of the COVID-19 declaration under Section 564(b)(1) of the Act, 21 U.S.C. section 360bbb-3(b)(1), unless the authorization is terminated or revoked.  Performed at [facility], [address] [place], [place] [address]   Blood Culture ID Panel (Reflexed)     Status: Abnormal   Collection Time: 04/23/24  4:50 AM  Result Value Ref Range Status   Enterococcus faecalis NOT DETECTED NOT DETECTED Final   Enterococcus Faecium NOT DETECTED NOT DETECTED Final   Listeria monocytogenes NOT DETECTED NOT DETECTED Final   Staphylococcus species DETECTED (A) NOT DETECTED Final     Comment: CRITICAL RESULT CALLED TO, READ BACK BY AND VERIFIED WITH: ALEX CHAPPELL @1033  04/24/24 MJU    Staphylococcus aureus (BCID) NOT DETECTED NOT DETECTED Final   Staphylococcus epidermidis DETECTED (A) NOT DETECTED Final    Comment: Methicillin (oxacillin) resistant coagulase negative staphylococcus. Possible blood culture contaminant (unless isolated from more than one blood culture draw or clinical case suggests pathogenicity). No antibiotic treatment is indicated for blood  culture contaminants. CRITICAL RESULT CALLED TO, READ BACK BY AND VERIFIED WITH: ALEX CHAPPELL @1033  04/24/24 MJU    Staphylococcus lugdunensis NOT DETECTED NOT DETECTED Final   Streptococcus species NOT DETECTED NOT DETECTED Final   Streptococcus agalactiae NOT DETECTED NOT DETECTED Final   Streptococcus pneumoniae NOT DETECTED NOT DETECTED Final   Streptococcus pyogenes NOT DETECTED NOT DETECTED Final   A.calcoaceticus-baumannii NOT DETECTED NOT DETECTED Final   Bacteroides fragilis NOT DETECTED NOT DETECTED Final   Enterobacterales NOT DETECTED NOT DETECTED Final   Enterobacter cloacae complex NOT DETECTED NOT DETECTED Final   Escherichia coli NOT DETECTED NOT DETECTED Final   Klebsiella aerogenes NOT DETECTED NOT DETECTED Final   Klebsiella oxytoca NOT DETECTED NOT DETECTED Final   Klebsiella pneumoniae NOT DETECTED NOT DETECTED Final   Proteus species NOT DETECTED NOT DETECTED Final   Salmonella species NOT DETECTED NOT DETECTED Final   Serratia marcescens NOT DETECTED NOT DETECTED Final   Haemophilus influenzae NOT DETECTED NOT DETECTED Final   Neisseria meningitidis NOT DETECTED NOT DETECTED Final   Pseudomonas aeruginosa NOT DETECTED NOT DETECTED Final   Stenotrophomonas maltophilia NOT DETECTED NOT DETECTED Final   Candida albicans NOT DETECTED NOT DETECTED Final   Candida auris NOT DETECTED NOT DETECTED Final   Candida glabrata NOT DETECTED NOT DETECTED Final   Candida krusei NOT DETECTED NOT  DETECTED Final   Candida parapsilosis NOT DETECTED NOT DETECTED Final   Candida tropicalis NOT DETECTED NOT DETECTED Final   Cryptococcus neoformans/gattii NOT DETECTED NOT DETECTED Final   Methicillin resistance mecA/C DETECTED (A) NOT DETECTED Final    Comment: CRITICAL RESULT CALLED TO, READ BACK BY AND VERIFIED WITH: ALEX CHAPPELL @1033  04/24/24 MJU Performed at [facility], [address] [place], [place] [address]     Coagulation Studies: No results for input(s): "LABPROT", "INR" in the last 72 hours.  Urinalysis: No results for input(s): "COLORURINE", "LABSPEC", "PHURINE", "GLUCOSEU", "HGBUR", "BILIRUBINUR", "KETONESUR", "PROTEINUR", "UROBILINOGEN", "NITRITE", "LEUKOCYTESUR" in the last 72 hours.  Invalid input(s): "APPERANCEUR"    Imaging: DG Chest Port 1 View Result Date: 04/26/2024 CLINICAL DATA:  [id number] SOB (shortness of breath) [id number] EXAM: PORTABLE CHEST 1 VIEW  COMPARISON:  Apr 23, 2024, December 16, 2019 FINDINGS: The cardiomediastinal silhouette is unchanged and enlarged in contour. No pleural effusion. No pneumothorax. No acute pleuroparenchymal abnormality. IMPRESSION: No acute cardiopulmonary abnormality. Electronically Signed   By: Clancy Crimes M.D.   On: 04/26/2024 16:59     Medications:     arformoterol   15 mcg Nebulization BID   And   umeclidinium bromide   1 puff Inhalation Daily   atorvastatin   80 mg Oral Daily   azelastine   1 spray Each Nare BID   budesonide  (PULMICORT ) nebulizer solution  0.25 mg Nebulization BID   citalopram   10 mg Oral Daily   clopidogrel   75 mg Oral Daily   diltiazem   30 mg Oral TID   guaiFENesin   600 mg Oral BID   heparin   5,000 Units Subcutaneous Q12H   insulin  aspart  0-5 Units Subcutaneous QHS   insulin  aspart  0-9 Units Subcutaneous TID WC   insulin  glargine-yfgn  15 Units Subcutaneous QHS   isosorbide  mononitrate  120 mg Oral BID   loratadine   10 mg Oral Daily   metoprolol  succinate  100 mg Oral Daily    montelukast   10 mg Oral QHS   nitroGLYCERIN   0.4 mg Transdermal Daily   pantoprazole   40 mg Oral BID AC   ranolazine   500 mg Oral BID   traZODone   25 mg Oral QHS   acetaminophen , fluticasone , HYDROmorphone  (DILAUDID ) injection, methocarbamol , nitroGLYCERIN , nitroGLYCERIN , ondansetron  (ZOFRAN ) IV, mouth rinse  Assessment/ Plan:  Mr. Serenity Fortner is a 83 y.o.  male  with a PMHx of diabetes, peripheral vascular disease, coronary artery disease, congestive heart failure, hypertension, chronic kidney disease stage IV with a GFR of around 20 cc/min now admitted with history of chest pain along with some shortness of breath.   Chronic kidney disease stage IV with baseline creatinine 3.0 and GFR of 20 on 02/20/24.  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 Renal function has remained at baseline during this admission. Will continue to follow with Dr Lamount Pimple outpatient. Recommend holding Torsemide  today and resuming tomorrow.    Lab Results  Component Value Date   CREATININE 2.88 (H) 04/28/2024   CREATININE 3.12 (H) 04/27/2024   CREATININE 3.04 (H) 04/26/2024    Intake/Output Summary (Last 24 hours) at 04/28/2024 1129 Last data filed at 04/28/2024 0500 Gross per 24 hour  Intake 480 ml  Output 750 ml  Net -270 ml   2: Congestive diastolic heart failure: Last ECHO on 04/24/24 shoes EF 60-65% with a grade 2 diastolic dysfunction. Advised the patient on the importance of fluid restriction.  Continue to hold Torsemide  today and resume tomorrow.    3: Anemia with chronic disease: Anemia secondary to chronic kidney disease: Hgb 10.5, at goal. No indication for ESA at this time. Continues to follow hematology outpatient.    4: Secondary hyperparathyroidism: Patient has been on vitamin D  and calcitriol .  Patient need to resume that as outpatient.    LOS: 5 Bruin Bolger 5/20/202511:29 AM

## 2024-04-29 NOTE — Progress Notes (Signed)
 [facility] Liaison Note  04/29/2024  Mike Decker 1941/03/24 [id number]  Location: RN Hospital Liaison screened the patient remotely at [facility].  Insurance: Medicare   Mike Decker is a 83 y.o. male who is a Primary Care Patient of Abernathy, Janeice Medal, NP [facility]). The patient was screened for readmission hospitalization with noted extreme risk score for unplanned readmission risk with 2 IP/1 ED in 6 months.  The patient was assessed for potential Care Management service needs for post hospital transition for care coordination. Review of patient's electronic medical record reveals patient was admitted for Chest Pain-Myocardial ischemia. Pt discharged home with outpatient PT services. No anticipated VBCI needs presented at this time. VBCI does not perform post hospital transition of care calls for this provider.   VBCI Care Management/Population Health does not replace or interfere with any arrangements made by the Inpatient Transition of Care team.   For questions contact:   Lilla Reichert, RN, BSN Hospital Liaison [place]   [facility], Population Health Office Hours MTWF  8:00 am-6:00 pm Direct Dial: [phone] mobile [email or web address]

## 2024-05-06 ENCOUNTER — Encounter: Payer: Self-pay | Admitting: Nurse Practitioner

## 2024-05-06 ENCOUNTER — Ambulatory Visit (INDEPENDENT_AMBULATORY_CARE_PROVIDER_SITE_OTHER): Admitting: Nurse Practitioner

## 2024-05-06 VITALS — BP 147/84 | HR 76 | Temp 98.0°F | Resp 16 | Ht 66.2 in | Wt 183.0 lb

## 2024-05-06 DIAGNOSIS — E1165 Type 2 diabetes mellitus with hyperglycemia: Secondary | ICD-10-CM | POA: Diagnosis not present

## 2024-05-06 DIAGNOSIS — I482 Chronic atrial fibrillation, unspecified: Secondary | ICD-10-CM

## 2024-05-06 DIAGNOSIS — I2510 Atherosclerotic heart disease of native coronary artery without angina pectoris: Secondary | ICD-10-CM

## 2024-05-06 DIAGNOSIS — J4520 Mild intermittent asthma, uncomplicated: Secondary | ICD-10-CM

## 2024-05-06 DIAGNOSIS — I700 Atherosclerosis of aorta: Secondary | ICD-10-CM

## 2024-05-06 DIAGNOSIS — I2583 Coronary atherosclerosis due to lipid rich plaque: Secondary | ICD-10-CM | POA: Diagnosis not present

## 2024-05-06 DIAGNOSIS — I5032 Chronic diastolic (congestive) heart failure: Secondary | ICD-10-CM | POA: Diagnosis not present

## 2024-05-06 DIAGNOSIS — I350 Nonrheumatic aortic (valve) stenosis: Secondary | ICD-10-CM | POA: Diagnosis not present

## 2024-05-06 DIAGNOSIS — I10 Essential (primary) hypertension: Secondary | ICD-10-CM | POA: Diagnosis not present

## 2024-05-06 DIAGNOSIS — G4733 Obstructive sleep apnea (adult) (pediatric): Secondary | ICD-10-CM | POA: Diagnosis not present

## 2024-05-06 NOTE — Progress Notes (Unsigned)
 [facility] [address] [place], [place] [address]  Internal MEDICINE  Office Visit Note  Patient Name: Mike Decker  [id number]  [id number]  Date of Service: 05/06/2024   Complaints/HPI Pt is here for establishment of PCP. Chief Complaint  Patient presents with   New Patient (Initial Visit)   Hypertension   Hyperlipidemia   Diabetes    Patient sees Endo/Nephrology   Quality Metric Gaps    AWV, Colonoscopy, Shingles Vaccines    HPI Mike Decker presents for a new patient visit to establish care.  Well-appearing 83 y.o. male with PVD. CHF, aortic stenosis, CKD, diabetes type 2  Work: was in 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  Home: lives at home with wife  Diet: fair  Exercise: minimal Tobacco use: quit at age 48  Alcohol use: in the past, none now  Illicit drug use: none  Routine CRC screening: gets this done through the [facility] Labs: up to date   New or worsening pain: sciatica   Endocrinologist: Dr. Therisa Leep Nephrologist: Dr. Douglas Pulmonologist: Dr. Elfreda Bathe Hem/onc: Dr. Babara for anemia Cardiologist: Dr. Florencio Ophthalmologist: Dr. Iva Vascular Surgery: Dr. Selinda Gu.  Dermatologist: Dr. Dela  Also goes to the [facility] for his medications    Current Medication: Outpatient Encounter Medications as of 05/06/2024  Medication Sig Note   acetaminophen  (TYLENOL ) 500 MG tablet Take 1,000 mg by mouth every 8 (eight) hours as needed for mild pain. 04/23/2024: prn   atorvastatin  (LIPITOR ) 80 MG tablet Take 1 tablet (80 mg total) by mouth daily.    azelastine  (ASTELIN ) 0.1 % nasal spray USE 1 TO 2 SPRAYS IN EACH NOSTRIL TWICE A DAY    calcitRIOL  (ROCALTROL ) 0.25 MCG capsule Take 0.25 mcg by mouth daily.    citalopram  (CELEXA ) 10 MG tablet Take 1 tablet (10 mg total) by mouth daily.    clopidogrel  (PLAVIX ) 75 MG tablet TAKE 1 TABLET BY MOUTH EVERY DAY    [employer] VITAMIN B12 1000 MCG tablet TAKE 1 TABLET BY MOUTH  EVERY DAY 10/30/2023: Pt taking daily   diltiazem  (CARDIZEM ) 30 MG tablet Take 1 tablet (30 mg total) by mouth 3 (three) times daily. Do not take this medication if SBP less than 110 and/or HR less than 60    docusate sodium  (COLACE) 100 MG capsule Take 100 mg by mouth 2 (two) times daily.    EPINEPHrine  (EPI-PEN) 0.3 mg/0.3 mL SOAJ injection Inject into the muscle as needed (anaphylaxis). 04/23/2024: prn   ergocalciferol  (VITAMIN D2) 1.25 MG (50000 UT) capsule Take 50,000 Units by mouth every 30 (thirty) days.    ferrous sulfate  325 (65 FE) MG EC tablet TAKE 1 TABLET BY MOUTH EVERY DAY    fluticasone  (FLONASE ) 50 MCG/ACT nasal spray USE 1 SPRAY IN EACH NOSTRIL EVERY DAY    furosemide  (LASIX ) 40 MG tablet Take 1 tablet by mouth daily.    hydrALAZINE  (APRESOLINE ) 50 MG tablet Take 25 mg by mouth 2 (two) times daily.    insulin  aspart (NOVOLOG ) 100 UNIT/ML FlexPen 14 UNITS IN AM, 30 UNITS AT LUNCH, 30 UNITS AT DINNER    insulin  glargine (LANTUS ) 100 UNIT/ML injection Inject 0.35 mLs (35 Units total) into the skin at bedtime. This is a decrease from your previous 45 units nightly. (Patient taking differently: Inject 60 Units into the skin at bedtime.)    isosorbide  mononitrate (IMDUR ) 120 MG 24 hr tablet Take 1 tablet (120 mg total) by mouth 2 (two)  times daily.    JARDIANCE 10 MG TABS tablet Take 10 mg by mouth daily.    magnesium  oxide (MAG-OX) 400 (240 Mg) MG tablet Take 1 tablet by mouth daily.    metoprolol  succinate (TOPROL -XL) 100 MG 24 hr tablet Take 1 tablet (100 mg total) by mouth daily. Take with or immediately following a meal.    mometasone  (ASMANEX ) 220 MCG/ACT inhaler Inhale 2 puffs into the lungs daily.    montelukast  (SINGULAIR ) 10 MG tablet TAKE 1 TABLET AT BEDTIME    nitroGLYCERIN  (NITROSTAT ) 0.4 MG SL tablet Place 0.4 mg under the tongue every 5 (five) minutes x 3 doses as needed for chest pain. 04/23/2024: prn   pantoprazole  (PROTONIX ) 40 MG tablet Take 1 tablet (40 mg total) by  mouth 2 (two) times daily before a meal.    ranolazine  (RANEXA ) 500 MG 12 hr tablet Take 1 tablet by mouth 2 (two) times daily.    sodium fluoride  (DENTA 5000 PLUS) 1.1 % CREA dental cream See admin instructions.    Tiotropium Bromide-Olodaterol (STIOLTO RESPIMAT) 2.5-2.5 MCG/ACT AERS Inhale 1 each into the lungs daily.    torsemide  (DEMADEX ) 20 MG tablet Take 40 mg by mouth 2 (two) times daily.    [DISCONTINUED] cetirizine (ZYRTEC) 5 MG tablet Take 5 mg by mouth daily. 04/23/2024: prn   No facility-administered encounter medications on file as of 05/06/2024.    Surgical History: Past Surgical History:  Procedure Laterality Date   CATARACT EXTRACTION  2011, 2012   COLONOSCOPY N/A 12/14/2018   Procedure: COLONOSCOPY;  Surgeon: Janalyn Keene NOVAK, MD;  Location: [facility] ENDOSCOPY;  Service: Endoscopy;  Laterality: N/A;   CORONARY ANGIOPLASTY WITH STENT PLACEMENT  12/10/2010   CORONARY STENT INTERVENTION N/A 12/29/2019   Procedure: CORONARY STENT INTERVENTION;  Surgeon: Mady Bruckner, MD;  Location: [facility] INVASIVE CV LAB;  Service: Cardiovascular;  Laterality: N/A;   ESOPHAGOGASTRODUODENOSCOPY N/A 12/14/2018   Procedure: ESOPHAGOGASTRODUODENOSCOPY (EGD);  Surgeon: Janalyn Keene NOVAK, MD;  Location: [facility] ENDOSCOPY;  Service: Endoscopy;  Laterality: N/A;   ESOPHAGOGASTRODUODENOSCOPY (EGD) WITH PROPOFOL  N/A 06/01/2016   Procedure: ESOPHAGOGASTRODUODENOSCOPY (EGD) WITH PROPOFOL ;  Surgeon: Lamar ONEIDA Holmes, MD;  Location: [facility] ENDOSCOPY;  Service: Endoscopy;  Laterality: N/A;   ESOPHAGOGASTRODUODENOSCOPY (EGD) WITH PROPOFOL  N/A 01/31/2023   Procedure: ESOPHAGOGASTRODUODENOSCOPY (EGD) WITH PROPOFOL ;  Surgeon: Jinny Carmine, MD;  Location: [facility] ENDOSCOPY;  Service: Endoscopy;  Laterality: N/A;   FEMORAL ARTERY STENT Right    HERNIA REPAIR     x2   INTRAOCULAR LENS INSERTION     LEFT HEART CATH AND CORONARY ANGIOGRAPHY N/A 05/09/2017   Procedure: Left Heart Cath and Coronary Angiography;  Surgeon:  Florencio Cara BIRCH, MD;  Location: [facility] INVASIVE CV LAB;  Service: Cardiovascular;  Laterality: N/A;   LEFT HEART CATH AND CORONARY ANGIOGRAPHY N/A 12/08/2018   Procedure: LEFT HEART CATH AND CORONARY ANGIOGRAPHY and possible PCI and stent;  Surgeon: Florencio Cara BIRCH, MD;  Location: [facility] INVASIVE CV LAB;  Service: Cardiovascular;  Laterality: N/A;   LEFT HEART CATH AND CORONARY ANGIOGRAPHY N/A 12/29/2019   Procedure: LEFT HEART CATH AND CORONARY ANGIOGRAPHY;  Surgeon: Mady Bruckner, MD;  Location: [facility] INVASIVE CV LAB;  Service: Cardiovascular;  Laterality: N/A;   LEFT HEART CATH AND CORONARY ANGIOGRAPHY Left 12/13/2022   Procedure: LEFT HEART CATH AND CORONARY ANGIOGRAPHY;  Surgeon: Florencio Cara BIRCH, MD;  Location: [facility] INVASIVE CV LAB;  Service: Cardiovascular;  Laterality: Left;   NOSE SURGERY     submucous resection   PROSTATE SURGERY  12/10/2000  ROTATOR CUFF REPAIR Right     Medical History: 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, esphageal   Chronic diastolic CHF (congestive heart failure) (HCC)    Colon polyp    Diabetes mellitus without complication (HCC)    Diverticulosis    Gout    Heart disease    Hemangioma    liver   Hyperlipidemia    Hypertension    Myocardial infarct (HCC)    Ocular hypertension    Peripheral vascular disease (HCC)    Skin cancer    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HCC)    Sleep apnea    Vitreoretinal degeneration     Family History: Family History  Problem Relation Age of Onset   Arthritis Mother    Stroke Maternal Grandfather    Breast cancer Neg Hx     Social History   Socioeconomic History   Marital status: Married    Spouse name: Not on file   Number of children: 2   Years of education: College 3 years   Highest education level: Associate degree: occupational, Scientist, product/process development, or vocational  program  Occupational History   Occupation: retired  Tobacco Use   Smoking status: Former    Current packs/day: 0.00    Types: Cigarettes    Start date: 12/10/1956    Quit date: 12/10/1976    Years since quitting: 47.4   Smokeless tobacco: Never  Vaping Use   Vaping status: Never Used  Substance and Sexual Activity   Alcohol use: No   Drug use: No   Sexual activity: Not Currently  Other Topics Concern   Not on file  Social History Narrative   Not on file   Social Drivers of Health   Financial Resource Strain: Low Risk  (06/21/2023)   Received from [facility]   Overall Financial Resource Strain (CARDIA)    Difficulty of Paying Living Expenses: Not very hard  Food Insecurity: No Food Insecurity (04/23/2024)   Hunger Vital Sign    Worried About Running Out of Food in the Last Year: Never true    Ran Out of Food in the Last Year: Never true  Transportation Needs: No Transportation Needs (04/23/2024)   PRAPARE - Administrator, Civil Service (Medical): No    Lack of Transportation (Non-Medical): No  Physical Activity: Insufficiently Active (09/15/2021)   Exercise Vital Sign    Days of Exercise per Week: 7 days    Minutes of Exercise per Session: 20 min  Stress: No Stress Concern Present (09/15/2021)   [employer] of Occupational Health - Occupational Stress Questionnaire    Feeling of Stress : Not at all  Social Connections: Moderately Isolated (04/23/2024)   Social Connection and Isolation Panel [NHANES]    Frequency of Communication with Friends and Family: Once a week    Frequency of Social Gatherings with Friends and Family: Once a week    Attends Religious Services: More than 4 times per year    Active Member of [employer] or Organizations: No    Attends Banker Meetings: Never    Marital Status: Married  Catering manager Violence: Not At Risk (04/23/2024)   Humiliation, Afraid, Rape, and Kick questionnaire    Fear of Current or  Ex-Partner: No    Emotionally Abused: No  Physically Abused: No    Sexually Abused: No     Review of Systems  Vital Signs: BP (!) 147/84   Pulse 76   Temp 98 F (36.7 C)   Resp 16   Ht 5' 6.2 (1.681 m)   Wt 183 lb (83 kg)   SpO2 98%   BMI 29.36 kg/m    Physical Exam    Assessment/Plan: 1. Type 2 diabetes mellitus with hyperglycemia, unspecified whether long term insulin  use (HCC) (Primary) ***    General Counseling: Morene oakland understanding of the findings of todays visit and agrees with plan of treatment. I have discussed any further diagnostic evaluation that may be needed or ordered today. We also reviewed his medications today. he has been encouraged to call the office with any questions or concerns that should arise related to todays visit.    Orders Placed This Encounter  Procedures   Protein / creatinine ratio, urine   Protein / creatinine ratio, urine    No orders of the defined types were placed in this encounter.   Return in about 3 months (around 08/06/2024) for AWV, Librado Guandique PCP.  Time spent:30 Minutes Time spent with patient included reviewing progress notes, labs, imaging studies, and discussing plan for follow up.   [place] Controlled Substance Database was reviewed by me for overdose risk score (ORS)   This patient was seen by Mardy Maxin, FNP-C in collaboration with Dr. Sigrid Bathe as a part of collaborative care agreement.   Debrah Granderson R. Maxin, MSN, FNP-C Internal Medicine

## 2024-05-07 DIAGNOSIS — N184 Chronic kidney disease, stage 4 (severe): Secondary | ICD-10-CM | POA: Diagnosis not present

## 2024-05-07 DIAGNOSIS — N2581 Secondary hyperparathyroidism of renal origin: Secondary | ICD-10-CM | POA: Diagnosis not present

## 2024-05-07 DIAGNOSIS — E875 Hyperkalemia: Secondary | ICD-10-CM | POA: Diagnosis not present

## 2024-05-07 DIAGNOSIS — I129 Hypertensive chronic kidney disease with stage 1 through stage 4 chronic kidney disease, or unspecified chronic kidney disease: Secondary | ICD-10-CM | POA: Diagnosis not present

## 2024-05-07 DIAGNOSIS — E1122 Type 2 diabetes mellitus with diabetic chronic kidney disease: Secondary | ICD-10-CM | POA: Diagnosis not present

## 2024-05-07 DIAGNOSIS — D631 Anemia in chronic kidney disease: Secondary | ICD-10-CM | POA: Diagnosis not present

## 2024-05-11 DIAGNOSIS — G4733 Obstructive sleep apnea (adult) (pediatric): Secondary | ICD-10-CM | POA: Diagnosis not present

## 2024-05-11 DIAGNOSIS — I2510 Atherosclerotic heart disease of native coronary artery without angina pectoris: Secondary | ICD-10-CM | POA: Diagnosis not present

## 2024-05-11 DIAGNOSIS — E669 Obesity, unspecified: Secondary | ICD-10-CM | POA: Diagnosis not present

## 2024-05-11 DIAGNOSIS — I5032 Chronic diastolic (congestive) heart failure: Secondary | ICD-10-CM | POA: Diagnosis not present

## 2024-05-11 DIAGNOSIS — E119 Type 2 diabetes mellitus without complications: Secondary | ICD-10-CM | POA: Diagnosis not present

## 2024-05-11 DIAGNOSIS — N184 Chronic kidney disease, stage 4 (severe): Secondary | ICD-10-CM | POA: Diagnosis not present

## 2024-05-11 DIAGNOSIS — I2585 Chronic coronary microvascular dysfunction: Secondary | ICD-10-CM | POA: Diagnosis not present

## 2024-05-11 DIAGNOSIS — J449 Chronic obstructive pulmonary disease, unspecified: Secondary | ICD-10-CM | POA: Diagnosis not present

## 2024-05-11 DIAGNOSIS — N1832 Chronic kidney disease, stage 3b: Secondary | ICD-10-CM | POA: Diagnosis not present

## 2024-05-11 DIAGNOSIS — R079 Chest pain, unspecified: Secondary | ICD-10-CM | POA: Diagnosis not present

## 2024-05-11 DIAGNOSIS — K219 Gastro-esophageal reflux disease without esophagitis: Secondary | ICD-10-CM | POA: Diagnosis not present

## 2024-05-11 DIAGNOSIS — R0602 Shortness of breath: Secondary | ICD-10-CM | POA: Diagnosis not present

## 2024-05-13 DIAGNOSIS — E1142 Type 2 diabetes mellitus with diabetic polyneuropathy: Secondary | ICD-10-CM | POA: Diagnosis not present

## 2024-05-19 ENCOUNTER — Inpatient Hospital Stay: Attending: Oncology

## 2024-05-19 ENCOUNTER — Other Ambulatory Visit

## 2024-05-19 DIAGNOSIS — E538 Deficiency of other specified B group vitamins: Secondary | ICD-10-CM | POA: Diagnosis not present

## 2024-05-19 DIAGNOSIS — Z794 Long term (current) use of insulin: Secondary | ICD-10-CM | POA: Diagnosis not present

## 2024-05-19 DIAGNOSIS — Z9289 Personal history of other medical treatment: Secondary | ICD-10-CM | POA: Diagnosis not present

## 2024-05-19 DIAGNOSIS — R809 Proteinuria, unspecified: Secondary | ICD-10-CM | POA: Diagnosis not present

## 2024-05-19 DIAGNOSIS — N184 Chronic kidney disease, stage 4 (severe): Secondary | ICD-10-CM | POA: Diagnosis not present

## 2024-05-19 DIAGNOSIS — I130 Hypertensive heart and chronic kidney disease with heart failure and stage 1 through stage 4 chronic kidney disease, or unspecified chronic kidney disease: Secondary | ICD-10-CM | POA: Diagnosis not present

## 2024-05-19 DIAGNOSIS — E1129 Type 2 diabetes mellitus with other diabetic kidney complication: Secondary | ICD-10-CM | POA: Diagnosis not present

## 2024-05-19 DIAGNOSIS — E1159 Type 2 diabetes mellitus with other circulatory complications: Secondary | ICD-10-CM | POA: Diagnosis not present

## 2024-05-19 DIAGNOSIS — D631 Anemia in chronic kidney disease: Secondary | ICD-10-CM | POA: Insufficient documentation

## 2024-05-19 DIAGNOSIS — E1122 Type 2 diabetes mellitus with diabetic chronic kidney disease: Secondary | ICD-10-CM | POA: Diagnosis not present

## 2024-05-19 DIAGNOSIS — I2510 Atherosclerotic heart disease of native coronary artery without angina pectoris: Secondary | ICD-10-CM | POA: Diagnosis not present

## 2024-05-19 DIAGNOSIS — E1142 Type 2 diabetes mellitus with diabetic polyneuropathy: Secondary | ICD-10-CM | POA: Diagnosis not present

## 2024-05-19 LAB — RETIC PANEL
Immature Retic Fract: 19.3 % — ABNORMAL HIGH (ref 2.3–15.9)
RBC.: 3.56 MIL/uL — ABNORMAL LOW (ref 4.22–5.81)
Retic Count, Absolute: 137.4 10*3/uL (ref 19.0–186.0)
Retic Ct Pct: 3.9 % — ABNORMAL HIGH (ref 0.4–3.1)
Reticulocyte Hemoglobin: 33.8 pg (ref >27.9)

## 2024-05-19 LAB — CBC WITH DIFFERENTIAL (CANCER CENTER ONLY)
Abs Immature Granulocytes: 0.07 10*3/uL (ref 0.00–0.07)
Basophils Absolute: 0.1 10*3/uL (ref 0.0–0.1)
Basophils Relative: 1 %
Eosinophils Absolute: 0.6 10*3/uL — ABNORMAL HIGH (ref 0.0–0.5)
Eosinophils Relative: 6 %
HCT: 34.4 % — ABNORMAL LOW (ref 39.0–52.0)
Hemoglobin: 11 g/dL — ABNORMAL LOW (ref 13.0–17.0)
Immature Granulocytes: 1 %
Lymphocytes Relative: 8 %
Lymphs Abs: 0.8 10*3/uL (ref 0.7–4.0)
MCH: 30.5 pg (ref 26.0–34.0)
MCHC: 32 g/dL (ref 30.0–36.0)
MCV: 95.3 fL (ref 80.0–100.0)
Monocytes Absolute: 0.7 10*3/uL (ref 0.1–1.0)
Monocytes Relative: 7 %
Neutro Abs: 7.3 10*3/uL (ref 1.7–7.7)
Neutrophils Relative %: 77 %
Platelet Count: 194 10*3/uL (ref 150–400)
RBC: 3.61 MIL/uL — ABNORMAL LOW (ref 4.22–5.81)
RDW: 14.2 % (ref 11.5–15.5)
WBC Count: 9.6 10*3/uL (ref 4.0–10.5)
nRBC: 0 % (ref 0.0–0.2)

## 2024-05-19 LAB — IRON AND TIBC
Iron: 96 ug/dL (ref 45–182)
Saturation Ratios: 27 % (ref 17.9–39.5)
TIBC: 350 ug/dL (ref 250–450)
UIBC: 254 ug/dL

## 2024-05-19 LAB — FERRITIN: Ferritin: 84 ng/mL (ref 24–336)

## 2024-05-19 LAB — VITAMIN B12: Vitamin B-12: 703 pg/mL (ref 180–914)

## 2024-05-28 ENCOUNTER — Telehealth: Payer: Self-pay | Admitting: *Deleted

## 2024-05-28 DIAGNOSIS — N184 Chronic kidney disease, stage 4 (severe): Secondary | ICD-10-CM | POA: Diagnosis not present

## 2024-05-28 DIAGNOSIS — N2581 Secondary hyperparathyroidism of renal origin: Secondary | ICD-10-CM | POA: Diagnosis not present

## 2024-05-28 DIAGNOSIS — E1122 Type 2 diabetes mellitus with diabetic chronic kidney disease: Secondary | ICD-10-CM | POA: Diagnosis not present

## 2024-05-28 DIAGNOSIS — D631 Anemia in chronic kidney disease: Secondary | ICD-10-CM | POA: Diagnosis not present

## 2024-05-28 DIAGNOSIS — R8290 Unspecified abnormal findings in urine: Secondary | ICD-10-CM | POA: Diagnosis not present

## 2024-05-28 DIAGNOSIS — I129 Hypertensive chronic kidney disease with stage 1 through stage 4 chronic kidney disease, or unspecified chronic kidney disease: Secondary | ICD-10-CM | POA: Diagnosis not present

## 2024-05-28 DIAGNOSIS — E875 Hyperkalemia: Secondary | ICD-10-CM | POA: Diagnosis not present

## 2024-05-28 NOTE — Telephone Encounter (Signed)
 Patient wants you to give him a call based on the visit that they had before and got the labs that once they came back that you guys would call them and let them know about the labs and if they need follow-up because of the labs or anything else

## 2024-05-29 ENCOUNTER — Encounter: Payer: Self-pay | Admitting: Oncology

## 2024-05-29 NOTE — Telephone Encounter (Signed)
 Called and Detailed VM letting him know of MD response and recommendation to move up appt.   Please cancel lab in Sept (he had labs on 6/10 inpatient) and move up MD/ +/- venofer  to this month. Please notify pt of appt details.

## 2024-06-02 ENCOUNTER — Encounter: Payer: Self-pay | Admitting: Oncology

## 2024-06-02 ENCOUNTER — Inpatient Hospital Stay (HOSPITAL_BASED_OUTPATIENT_CLINIC_OR_DEPARTMENT_OTHER): Admitting: Oncology

## 2024-06-02 ENCOUNTER — Inpatient Hospital Stay

## 2024-06-02 VITALS — BP 126/61 | HR 64 | Resp 17

## 2024-06-02 VITALS — BP 133/50 | HR 55 | Temp 99.5°F | Resp 18 | Wt 187.5 lb

## 2024-06-02 DIAGNOSIS — I130 Hypertensive heart and chronic kidney disease with heart failure and stage 1 through stage 4 chronic kidney disease, or unspecified chronic kidney disease: Secondary | ICD-10-CM | POA: Diagnosis not present

## 2024-06-02 DIAGNOSIS — I5033 Acute on chronic diastolic (congestive) heart failure: Secondary | ICD-10-CM

## 2024-06-02 DIAGNOSIS — J9601 Acute respiratory failure with hypoxia: Secondary | ICD-10-CM | POA: Insufficient documentation

## 2024-06-02 DIAGNOSIS — K22711 Barrett's esophagus with high grade dysplasia: Secondary | ICD-10-CM

## 2024-06-02 DIAGNOSIS — N184 Chronic kidney disease, stage 4 (severe): Secondary | ICD-10-CM

## 2024-06-02 DIAGNOSIS — C159 Malignant neoplasm of esophagus, unspecified: Secondary | ICD-10-CM

## 2024-06-02 DIAGNOSIS — E538 Deficiency of other specified B group vitamins: Secondary | ICD-10-CM | POA: Diagnosis not present

## 2024-06-02 DIAGNOSIS — D631 Anemia in chronic kidney disease: Secondary | ICD-10-CM | POA: Diagnosis not present

## 2024-06-02 MED ORDER — IRON SUCROSE 20 MG/ML IV SOLN
200.0000 mg | Freq: Once | INTRAVENOUS | Status: AC
  Administered 2024-06-02: 200 mg via INTRAVENOUS
  Filled 2024-06-02: qty 10

## 2024-06-02 MED ORDER — SODIUM CHLORIDE 0.9% FLUSH
10.0000 mL | Freq: Once | INTRAVENOUS | Status: AC | PRN
  Administered 2024-06-02: 10 mL
  Filled 2024-06-02: qty 10

## 2024-06-02 NOTE — Progress Notes (Signed)
 Hematology/Oncology Progress note [phone] [phone]    REASON FOR VISIT follow-up for management of iron  deficiency anemia, anemia and CKD,   ASSESSMENT & PLAN:   Anemia of chronic renal failure, stage 4 (severe) (HCC) #Anemia in CKD Labs are reviewed and are discussed with patient Lab Results  Component Value Date   HGB 11.0 (L) 05/19/2024   TIBC 350 05/19/2024   IRONPCTSAT 27 05/19/2024   FERRITIN 84 05/19/2024   Stable hemoglobin.  Hemoglobin has decreased compared to last visit.  Recommend Venofer  weekly x 3 to further improve iron  panel.  No need for Retacrit. Recommend 1 dose of Venofer  to improve iron  store.  After IV venofer , recommend maintenance ferrous sulfate  325mg  daily. .  Vitamin B12 deficiency History of Vitamin B12 deficiency, negative intrinsic factor antibody and antiparital antibody.  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  S/p resection.  Recommend patient to follow-up with gastroenterology.   Orders Placed This Encounter  Procedures   CBC with Differential (Cancer Center Only)    Standing Status:   Future    Expected Date:   09/02/2024    Expiration Date:   12/01/2024   Iron  and TIBC    Standing Status:   Future    Expected Date:   09/02/2024    Expiration Date:   12/01/2024   Ferritin    Standing Status:   Future    Expected Date:   09/02/2024    Expiration Date:   12/01/2024   Retic Panel    Standing Status:   Future    Expected Date:   09/02/2024    Expiration Date:   12/01/2024  Follow-up in 6 months.  All questions were answered. The patient knows to call the clinic with any problems, questions or concerns.  Zelphia Cap, MD, PhD [facility] Hematology Oncology 06/02/2024     PERTINENT HEMATOLOGY Oncology HISTORY received IV Venofer  daily x3 as well as multiple units of PRBC transfusion. GI was consulted and patient had EGD done during  his hospitalization.  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  Treated with argon plasma coagulation.  Colonoscopy showed polyps which were removed. Patient was discharged on 12/15/2018.  # 03/23/2019 mammogram showed gynecomastia.  #History of cutaneous melanoma status post Mohs surgery. 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 95% stenosis in the left circumflex status post drug-eluting stent. Patient is on aspirin  and Plavix  prior to this cardiac event. 04/18/2020, patient has a ER visit due to chest pain. Troponin was negative. Pain improved with medication. Patient was discharged with outpatient cardiology follow-up.  hospitalization 12/09/2022 in [facility] house due to chest pain/non-STEMI, started on heparin  drip.  Seen by cardiology Dr. Florencio.    INTERVAL HISTORY 83 y.o. Decker with PMH listed as below present to follow-up for anemia of CKD.  He follows up with cardiology for CHF.    Patient had recent hospitalization in May 2025 due to CHF.  Today he feels well at baseline. chronic shortness of breath, unchanged.  Review of Systems  Constitutional:  Positive for fatigue. Negative for appetite change, chills, fever and unexpected weight change.  HENT:   Negative for hearing loss and voice change.   Eyes:  Negative for eye problems and icterus.  Respiratory:  Negative for chest tightness, cough and shortness of breath.   Cardiovascular:  Negative for chest pain and leg swelling.  Gastrointestinal:  Negative for abdominal distention and abdominal pain.  Endocrine: Negative for hot flashes.  Genitourinary:  Negative for difficulty urinating, dysuria and frequency.   Musculoskeletal:  Negative for arthralgias.  Skin:  Negative for itching and rash.  Neurological:  Negative for light-headedness and numbness.  Hematological:  Negative for adenopathy.  Bruises/bleeds easily.  Psychiatric/Behavioral:  Negative for confusion.     Allergies  Allergen Reactions   Gabapentin      Other reaction(s): Other (See Comments) Tremors   Peanut-Containing Drug Products Anaphylaxis   Penicillins Hives and Rash    Per notes - patient tolerated cefdinir in 2022    Bee Venom Swelling   Influenza Vaccines Hives   Inh [Isoniazid ] Hives   Jardiance [Empagliflozin] Other (See Comments)    Diarrhea   Kenalog [Triamcinolone Acetonide] Hives   Levaquin  [Levofloxacin ] Other (See Comments)    Tendon, ligament pain.    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, esphageal   Chronic diastolic CHF (congestive heart failure) (HCC)    Colon polyp    Diabetes mellitus without complication (HCC)    Diverticulosis    Gout    Heart disease    Hemangioma    liver   Hyperlipidemia    Hypertension    Myocardial infarct [facility])    Ocular hypertension    Peripheral vascular disease (HCC)    Skin cancer    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HCC)    Sleep apnea    Vitreoretinal degeneration      Past Surgical History:  Procedure Laterality Date   CATARACT EXTRACTION  2011, 2012   COLONOSCOPY N/A 12/14/2018   Procedure: COLONOSCOPY;  Surgeon: Janalyn Keene NOVAK, MD;  Location: [facility] ENDOSCOPY;  Service: Endoscopy;  Laterality: N/A;   CORONARY ANGIOPLASTY WITH STENT PLACEMENT  12/10/2010   CORONARY STENT INTERVENTION N/A 12/29/2019   Procedure: CORONARY STENT INTERVENTION;  Surgeon: Mady Bruckner, MD;  Location: [facility] INVASIVE CV LAB;  Service: Cardiovascular;  Laterality: N/A;   ESOPHAGOGASTRODUODENOSCOPY N/A 12/14/2018   Procedure: ESOPHAGOGASTRODUODENOSCOPY (EGD);   Surgeon: Janalyn Keene NOVAK, MD;  Location: [facility] ENDOSCOPY;  Service: Endoscopy;  Laterality: N/A;   ESOPHAGOGASTRODUODENOSCOPY (EGD) WITH PROPOFOL  N/A 06/01/2016   Procedure: ESOPHAGOGASTRODUODENOSCOPY (EGD) WITH PROPOFOL ;  Surgeon: Lamar ONEIDA Holmes, MD;  Location: [facility] ENDOSCOPY;  Service: Endoscopy;  Laterality: N/A;   ESOPHAGOGASTRODUODENOSCOPY (EGD) WITH PROPOFOL  N/A 01/31/2023   Procedure: ESOPHAGOGASTRODUODENOSCOPY (EGD) WITH PROPOFOL ;  Surgeon: Jinny Carmine, MD;  Location: [facility] ENDOSCOPY;  Service: Endoscopy;  Laterality: N/A;   FEMORAL ARTERY STENT Right    HERNIA REPAIR     x2   INTRAOCULAR LENS INSERTION     LEFT HEART CATH AND CORONARY ANGIOGRAPHY N/A 05/09/2017   Procedure: Left Heart Cath and Coronary Angiography;  Surgeon: Florencio Cara BIRCH, MD;  Location: [facility] INVASIVE CV LAB;  Service: Cardiovascular;  Laterality: N/A;   LEFT HEART CATH AND CORONARY ANGIOGRAPHY N/A 12/08/2018   Procedure: LEFT HEART CATH AND CORONARY ANGIOGRAPHY and possible PCI and stent;  Surgeon: Florencio Cara BIRCH, MD;  Location: [facility] INVASIVE CV  LAB;  Service: Cardiovascular;  Laterality: N/A;   LEFT HEART CATH AND CORONARY ANGIOGRAPHY N/A 12/29/2019   Procedure: LEFT HEART CATH AND CORONARY ANGIOGRAPHY;  Surgeon: Mady Bruckner, MD;  Location: [facility] INVASIVE CV LAB;  Service: Cardiovascular;  Laterality: N/A;   LEFT HEART CATH AND CORONARY ANGIOGRAPHY Left 12/13/2022   Procedure: LEFT HEART CATH AND CORONARY ANGIOGRAPHY;  Surgeon: Florencio Cara BIRCH, MD;  Location: [facility] INVASIVE CV LAB;  Service: Cardiovascular;  Laterality: Left;   NOSE SURGERY     submucous resection   PROSTATE SURGERY  12/10/2000   ROTATOR CUFF REPAIR Right     Social History   Socioeconomic History   Marital status: Married    Spouse name: Not on file   Number of children: 2   Years of education: College 3 years   Highest education level: Associate degree: occupational, Scientist, product/process development, or vocational program  Occupational History    Occupation: retired  Tobacco Use   Smoking status: Former    Current packs/day: 0.00    Types: Cigarettes    Start date: 12/10/1956    Quit date: 12/10/1976    Years since quitting: 47.5   Smokeless tobacco: Never  Vaping Use   Vaping status: Never Used  Substance and Sexual Activity   Alcohol use: No   Drug use: No   Sexual activity: Not Currently  Other Topics Concern   Not on file  Social History Narrative   Not on file   Social Drivers of Health   Financial Resource Strain: Low Risk  (06/21/2023)   Received from [facility]   Overall Financial Resource Strain (CARDIA)    Difficulty of Paying Living Expenses: Not very hard  Food Insecurity: No Food Insecurity (04/23/2024)   Hunger Vital Sign    Worried About Running Out of Food in the Last Year: Never true    Ran Out of Food in the Last Year: Never true  Transportation Needs: No Transportation Needs (04/23/2024)   PRAPARE - Administrator, Civil Service (Medical): No    Lack of Transportation (Non-Medical): No  Physical Activity: Insufficiently Active (09/15/2021)   Exercise Vital Sign    Days of Exercise per Week: 7 days    Minutes of Exercise per Session: 20 min  Stress: No Stress Concern Present (09/15/2021)   [employer] of Occupational Health - Occupational Stress Questionnaire    Feeling of Stress : Not at all  Social Connections: Moderately Isolated (04/23/2024)   Social Connection and Isolation Panel    Frequency of Communication with Friends and Family: Once a week    Frequency of Social Gatherings with Friends and Family: Once a week    Attends Religious Services: More than 4 times per year    Active Member of [employer] or Organizations: No    Attends Banker Meetings: Never    Marital Status: Married  Catering manager Violence: Not At Risk (04/23/2024)   Humiliation, Afraid, Rape, and Kick questionnaire    Fear of Current or Ex-Partner: No    Emotionally Abused: No     Physically Abused: No    Sexually Abused: No    Family History  Problem Relation Age of Onset   Arthritis Mother    Stroke Maternal Grandfather    Breast cancer Neg Hx      Current Outpatient Medications:    acetaminophen  (TYLENOL ) 500 MG tablet, Take 1,000 mg by mouth every 8 (eight) hours as needed for mild pain., Disp: , Rfl:  atorvastatin  (LIPITOR ) 80 MG tablet, Take 1 tablet (80 mg total) by mouth daily., Disp: 30 tablet, Rfl: 0   azelastine  (ASTELIN ) 0.1 % nasal spray, USE 1 TO 2 SPRAYS IN EACH NOSTRIL TWICE A DAY, Disp: , Rfl:    calcitRIOL  (ROCALTROL ) 0.25 MCG capsule, Take 0.25 mcg by mouth daily., Disp: , Rfl:    citalopram  (CELEXA ) 10 MG tablet, Take 1 tablet (10 mg total) by mouth daily., Disp: 90 tablet, Rfl: 3   clopidogrel  (PLAVIX ) 75 MG tablet, TAKE 1 TABLET BY MOUTH EVERY DAY, Disp: 90 tablet, Rfl: 1   colchicine  0.6 MG tablet, Take 0.6 mg by mouth 2 (two) times daily as needed., Disp: , Rfl:    [employer] VITAMIN B12 1000 MCG tablet, TAKE 1 TABLET BY MOUTH EVERY DAY, Disp: 30 tablet, Rfl: 0   diltiazem  (CARDIZEM ) 30 MG tablet, Take 1 tablet (30 mg total) by mouth 3 (three) times daily. Do not take this medication if SBP less than 110 and/or HR less than 60, Disp: 90 tablet, Rfl: 0   EPINEPHrine  (EPI-PEN) 0.3 mg/0.3 mL SOAJ injection, Inject into the muscle as needed (anaphylaxis)., Disp: , Rfl:    ferrous sulfate  325 (65 FE) MG EC tablet, TAKE 1 TABLET BY MOUTH EVERY DAY, Disp: 90 tablet, Rfl: 3   fluticasone  (FLONASE ) 50 MCG/ACT nasal spray, USE 1 SPRAY IN EACH NOSTRIL EVERY DAY, Disp: 32 g, Rfl: 6   furosemide  (LASIX ) 40 MG tablet, Take 1 tablet by mouth daily., Disp: , Rfl:    hydrALAZINE  (APRESOLINE ) 50 MG tablet, Take 25 mg by mouth 2 (two) times daily., Disp: , Rfl:    insulin  aspart (NOVOLOG ) 100 UNIT/ML FlexPen, Inject into the skin as needed. Take up to to 100 units daily in divided doses as advised, Disp: , Rfl:    isosorbide  mononitrate (IMDUR ) 120 MG 24 hr  tablet, Take 1 tablet (120 mg total) by mouth 2 (two) times daily., Disp: 60 tablet, Rfl: 0   JARDIANCE 10 MG TABS tablet, Take 10 mg by mouth daily., Disp: , Rfl:    magnesium  oxide (MAG-OX) 400 (240 Mg) MG tablet, Take 1 tablet by mouth daily., Disp: , Rfl:    methocarbamol  (ROBAXIN ) 500 MG tablet, Take 500 mg by mouth 2 (two) times daily., Disp: , Rfl:    metoprolol  succinate (TOPROL -XL) 100 MG 24 hr tablet, Take 1 tablet (100 mg total) by mouth daily. Take with or immediately following a meal., Disp: 30 tablet, Rfl: 0   mometasone  (ASMANEX ) 220 MCG/ACT inhaler, Inhale 2 puffs into the lungs daily., Disp: , Rfl:    montelukast  (SINGULAIR ) 10 MG tablet, TAKE 1 TABLET AT BEDTIME, Disp: 90 tablet, Rfl: 3   nitroGLYCERIN  (NITROSTAT ) 0.4 MG SL tablet, Place 0.4 mg under the tongue every 5 (five) minutes x 3 doses as needed for chest pain., Disp: , Rfl:    pantoprazole  (PROTONIX ) 40 MG tablet, Take 1 tablet (40 mg total) by mouth 2 (two) times daily before a meal., Disp: , Rfl:    ranolazine  (RANEXA ) 500 MG 12 hr tablet, Take 1 tablet by mouth 2 (two) times daily., Disp: , Rfl:    Tiotropium Bromide-Olodaterol (STIOLTO RESPIMAT) 2.5-2.5 MCG/ACT AERS, Inhale 1 each into the lungs daily., Disp: , Rfl:    torsemide  (DEMADEX ) 20 MG tablet, Take 40 mg by mouth 2 (two) times daily., Disp: , Rfl:    docusate sodium  (COLACE) 100 MG capsule, Take 100 mg by mouth 2 (two) times daily. (Patient not taking: Reported on  06/02/2024), Disp: , Rfl:    ergocalciferol  (VITAMIN D2) 1.25 MG (50000 UT) capsule, Take 50,000 Units by mouth every 30 (thirty) days. (Patient not taking: Reported on 06/02/2024), Disp: , Rfl:    insulin  glargine (LANTUS ) 100 UNIT/ML injection, Inject 0.35 mLs (35 Units total) into the skin at bedtime. This is a decrease from your previous 45 units nightly. (Patient not taking: Reported on 06/02/2024), Disp: 10 mL, Rfl: 11   sodium fluoride  (DENTA 5000 PLUS) 1.1 % CREA dental cream, See admin  instructions., Disp: , Rfl:   Physical exam:  Vitals:   06/02/24 1315  BP: (!) 133/50  Pulse: (!) 55  Resp: 18  Temp: 99.5 F (Mike.5 C)  Weight: 187 lb 8 oz (85 kg)   Physical Exam Constitutional:      General: He is not in acute distress.    Appearance: He is obese.  HENT:     Head: Normocephalic and atraumatic.   Eyes:     General: No scleral icterus.   Cardiovascular:     Rate and Rhythm: Normal rate.     Heart sounds: Normal heart sounds.  Pulmonary:     Effort: Pulmonary effort is normal. No respiratory distress.  Abdominal:     General: Bowel sounds are normal.     Palpations: Abdomen is soft.   Musculoskeletal:        General: Normal range of motion.     Cervical back: Normal range of motion and neck supple.   Skin:    Findings: No erythema or rash.   Neurological:     Mental Status: He is alert and oriented to person, place, and time. Mental status is at baseline.   Psychiatric:        Mood and Affect: Mood normal.        Latest Ref Rng & Units 04/28/2024    3:41 AM  CMP  Glucose 70 - 99 mg/dL 863   BUN 8 - 23 mg/dL 83   Creatinine 9.38 - 1.24 mg/dL 7.11   Sodium 864 - 854 mmol/L 134   Potassium 3.5 - 5.1 mmol/L 3.9   Chloride 98 - 111 mmol/L 102   CO2 22 - 32 mmol/L 21   Calcium  8.9 - 10.3 mg/dL 8.5       Latest Ref Rng & Units 05/19/2024    3:09 PM  CBC  WBC 4.0 - 10.5 K/uL 9.6   Hemoglobin 13.0 - 17.0 g/dL 88.9   Hematocrit 60.9 - 52.0 % 34.4   Platelets 150 - 400 K/uL 194     No results found.

## 2024-06-02 NOTE — Assessment & Plan Note (Signed)
 History of Vitamin B12 deficiency, negative intrinsic factor antibody and antiparital antibody.  Recommend  oral vitamin B12 supplementation daily

## 2024-06-02 NOTE — Assessment & Plan Note (Signed)
 With history of high-grade dysplastic.  S/p resection.  Recommend patient to follow-up with gastroenterology.

## 2024-06-02 NOTE — Assessment & Plan Note (Addendum)
#  Anemia in CKD Labs are reviewed and are discussed with patient Lab Results  Component Value Date   HGB 11.0 (L) 05/19/2024   TIBC 350 05/19/2024   IRONPCTSAT 27 05/19/2024   FERRITIN 84 05/19/2024   Stable hemoglobin.  Hemoglobin has decreased compared to last visit.  Recommend Venofer  weekly x 3 to further improve iron  panel.  No need for Retacrit. Recommend 1 dose of Venofer  to improve iron  store.  After IV venofer , recommend maintenance ferrous sulfate  325mg  daily. SABRA

## 2024-06-02 NOTE — Patient Instructions (Signed)

## 2024-06-02 NOTE — Progress Notes (Signed)
Patient tolerated Venofer infusion well, no questions/concerns voiced. Monitored 30 min post transfusion. Patient stable at discharge. VSS. AVS given.

## 2024-06-04 DIAGNOSIS — E875 Hyperkalemia: Secondary | ICD-10-CM | POA: Diagnosis not present

## 2024-06-04 DIAGNOSIS — E1122 Type 2 diabetes mellitus with diabetic chronic kidney disease: Secondary | ICD-10-CM | POA: Diagnosis not present

## 2024-06-04 DIAGNOSIS — I129 Hypertensive chronic kidney disease with stage 1 through stage 4 chronic kidney disease, or unspecified chronic kidney disease: Secondary | ICD-10-CM | POA: Diagnosis not present

## 2024-06-04 DIAGNOSIS — N2581 Secondary hyperparathyroidism of renal origin: Secondary | ICD-10-CM | POA: Diagnosis not present

## 2024-06-04 DIAGNOSIS — D631 Anemia in chronic kidney disease: Secondary | ICD-10-CM | POA: Diagnosis not present

## 2024-06-04 DIAGNOSIS — N184 Chronic kidney disease, stage 4 (severe): Secondary | ICD-10-CM | POA: Diagnosis not present

## 2024-06-09 ENCOUNTER — Inpatient Hospital Stay

## 2024-06-14 ENCOUNTER — Encounter: Payer: Self-pay | Admitting: Nurse Practitioner

## 2024-06-14 DIAGNOSIS — J4520 Mild intermittent asthma, uncomplicated: Secondary | ICD-10-CM | POA: Insufficient documentation

## 2024-06-14 LAB — CREATININE
Creatinine: 2.9 — AB (ref 0.6–1.3)
Creatinine: 3.1 — AB (ref 0.6–1.3)
EGFR: 19
EGFR: 21

## 2024-06-14 LAB — MICROALBUMIN / CREATININE URINE RATIO
Albumin, Ur: 21 mg/dL
Albumin, Ur: 8 mg/dL
Albumin/Creatinine Ratio, Urine, POC: 65
Albumin/Creatinine Ratio, Urine, POC: 22.4
Creatinine, Random Urine: 32.3
Creatinine, Random Urine: 35.7

## 2024-06-14 LAB — HEMOGLOBIN A1C: Hemoglobin A1C: 7.6

## 2024-06-16 ENCOUNTER — Inpatient Hospital Stay

## 2024-06-17 ENCOUNTER — Inpatient Hospital Stay: Attending: Oncology

## 2024-06-17 VITALS — BP 116/70 | HR 68 | Temp 98.0°F | Resp 18

## 2024-06-17 DIAGNOSIS — N184 Chronic kidney disease, stage 4 (severe): Secondary | ICD-10-CM | POA: Insufficient documentation

## 2024-06-17 DIAGNOSIS — D631 Anemia in chronic kidney disease: Secondary | ICD-10-CM | POA: Diagnosis not present

## 2024-06-17 MED ORDER — IRON SUCROSE 20 MG/ML IV SOLN
200.0000 mg | Freq: Once | INTRAVENOUS | Status: AC
  Administered 2024-06-17: 200 mg via INTRAVENOUS

## 2024-06-17 NOTE — Patient Instructions (Signed)

## 2024-06-23 ENCOUNTER — Inpatient Hospital Stay

## 2024-06-23 VITALS — BP 121/52 | HR 69 | Resp 17

## 2024-06-23 DIAGNOSIS — N184 Chronic kidney disease, stage 4 (severe): Secondary | ICD-10-CM | POA: Diagnosis not present

## 2024-06-23 DIAGNOSIS — D631 Anemia in chronic kidney disease: Secondary | ICD-10-CM | POA: Diagnosis not present

## 2024-06-23 MED ORDER — IRON SUCROSE 20 MG/ML IV SOLN
200.0000 mg | Freq: Once | INTRAVENOUS | Status: AC
  Administered 2024-06-23: 200 mg via INTRAVENOUS
  Filled 2024-06-23: qty 10

## 2024-06-23 MED ORDER — SODIUM CHLORIDE 0.9% FLUSH
10.0000 mL | Freq: Once | INTRAVENOUS | Status: AC | PRN
  Administered 2024-06-23: 10 mL
  Filled 2024-06-23: qty 10

## 2024-06-23 NOTE — Progress Notes (Signed)
Patient tolerated Venofer infusion well. Explained recommendation of 30 min post monitoring. Patient refused to wait post monitoring. Educated on what signs to watch for & to call with any concerns. No questions, discharged. Stable  

## 2024-06-23 NOTE — Patient Instructions (Signed)

## 2024-06-26 ENCOUNTER — Other Ambulatory Visit: Payer: Self-pay | Admitting: Internal Medicine

## 2024-07-01 ENCOUNTER — Encounter: Payer: Self-pay | Admitting: Emergency Medicine

## 2024-07-01 ENCOUNTER — Inpatient Hospital Stay
Admission: EM | Admit: 2024-07-01 | Discharge: 2024-07-05 | DRG: 303 | Disposition: A | Discharge status: Home Health | Attending: Internal Medicine | Admitting: Internal Medicine

## 2024-07-01 ENCOUNTER — Other Ambulatory Visit: Payer: Self-pay

## 2024-07-01 ENCOUNTER — Emergency Department

## 2024-07-01 DIAGNOSIS — Z886 Allergy status to analgesic agent status: Secondary | ICD-10-CM

## 2024-07-01 DIAGNOSIS — I5033 Acute on chronic diastolic (congestive) heart failure: Secondary | ICD-10-CM | POA: Diagnosis not present

## 2024-07-01 DIAGNOSIS — J941 Fibrothorax: Secondary | ICD-10-CM | POA: Diagnosis not present

## 2024-07-01 DIAGNOSIS — E1151 Type 2 diabetes mellitus with diabetic peripheral angiopathy without gangrene: Secondary | ICD-10-CM | POA: Diagnosis not present

## 2024-07-01 DIAGNOSIS — I130 Hypertensive heart and chronic kidney disease with heart failure and stage 1 through stage 4 chronic kidney disease, or unspecified chronic kidney disease: Secondary | ICD-10-CM | POA: Diagnosis present

## 2024-07-01 DIAGNOSIS — Z8501 Personal history of malignant neoplasm of esophagus: Secondary | ICD-10-CM

## 2024-07-01 DIAGNOSIS — E785 Hyperlipidemia, unspecified: Secondary | ICD-10-CM | POA: Diagnosis not present

## 2024-07-01 DIAGNOSIS — R0601 Orthopnea: Secondary | ICD-10-CM | POA: Diagnosis present

## 2024-07-01 DIAGNOSIS — E1122 Type 2 diabetes mellitus with diabetic chronic kidney disease: Secondary | ICD-10-CM | POA: Diagnosis not present

## 2024-07-01 DIAGNOSIS — R042 Hemoptysis: Secondary | ICD-10-CM | POA: Diagnosis not present

## 2024-07-01 DIAGNOSIS — R54 Age-related physical debility: Secondary | ICD-10-CM | POA: Diagnosis present

## 2024-07-01 DIAGNOSIS — I5030 Unspecified diastolic (congestive) heart failure: Secondary | ICD-10-CM | POA: Diagnosis not present

## 2024-07-01 DIAGNOSIS — Z823 Family history of stroke: Secondary | ICD-10-CM

## 2024-07-01 DIAGNOSIS — Z1152 Encounter for screening for COVID-19: Secondary | ICD-10-CM

## 2024-07-01 DIAGNOSIS — Z8673 Personal history of transient ischemic attack (TIA), and cerebral infarction without residual deficits: Secondary | ICD-10-CM

## 2024-07-01 DIAGNOSIS — R0602 Shortness of breath: Secondary | ICD-10-CM | POA: Diagnosis not present

## 2024-07-01 DIAGNOSIS — R42 Dizziness and giddiness: Secondary | ICD-10-CM | POA: Diagnosis not present

## 2024-07-01 DIAGNOSIS — R55 Syncope and collapse: Secondary | ICD-10-CM | POA: Diagnosis not present

## 2024-07-01 DIAGNOSIS — G4733 Obstructive sleep apnea (adult) (pediatric): Secondary | ICD-10-CM | POA: Diagnosis present

## 2024-07-01 DIAGNOSIS — N189 Chronic kidney disease, unspecified: Secondary | ICD-10-CM | POA: Diagnosis not present

## 2024-07-01 DIAGNOSIS — Z79899 Other long term (current) drug therapy: Secondary | ICD-10-CM

## 2024-07-01 DIAGNOSIS — F32A Depression, unspecified: Secondary | ICD-10-CM | POA: Diagnosis present

## 2024-07-01 DIAGNOSIS — Z85828 Personal history of other malignant neoplasm of skin: Secondary | ICD-10-CM

## 2024-07-01 DIAGNOSIS — I2489 Other forms of acute ischemic heart disease: Secondary | ICD-10-CM | POA: Diagnosis present

## 2024-07-01 DIAGNOSIS — Z7902 Long term (current) use of antithrombotics/antiplatelets: Secondary | ICD-10-CM

## 2024-07-01 DIAGNOSIS — N2581 Secondary hyperparathyroidism of renal origin: Secondary | ICD-10-CM | POA: Diagnosis not present

## 2024-07-01 DIAGNOSIS — I440 Atrioventricular block, first degree: Secondary | ICD-10-CM | POA: Diagnosis not present

## 2024-07-01 DIAGNOSIS — I350 Nonrheumatic aortic (valve) stenosis: Secondary | ICD-10-CM | POA: Diagnosis present

## 2024-07-01 DIAGNOSIS — Z85820 Personal history of malignant melanoma of skin: Secondary | ICD-10-CM

## 2024-07-01 DIAGNOSIS — K2270 Barrett's esophagus without dysplasia: Secondary | ICD-10-CM | POA: Diagnosis present

## 2024-07-01 DIAGNOSIS — I959 Hypotension, unspecified: Secondary | ICD-10-CM | POA: Diagnosis not present

## 2024-07-01 DIAGNOSIS — Z880 Allergy status to penicillin: Secondary | ICD-10-CM

## 2024-07-01 DIAGNOSIS — I209 Angina pectoris, unspecified: Secondary | ICD-10-CM | POA: Diagnosis not present

## 2024-07-01 DIAGNOSIS — R1310 Dysphagia, unspecified: Secondary | ICD-10-CM

## 2024-07-01 DIAGNOSIS — Z91030 Bee allergy status: Secondary | ICD-10-CM

## 2024-07-01 DIAGNOSIS — K224 Dyskinesia of esophagus: Secondary | ICD-10-CM | POA: Diagnosis not present

## 2024-07-01 DIAGNOSIS — Z794 Long term (current) use of insulin: Secondary | ICD-10-CM

## 2024-07-01 DIAGNOSIS — E1140 Type 2 diabetes mellitus with diabetic neuropathy, unspecified: Secondary | ICD-10-CM | POA: Diagnosis present

## 2024-07-01 DIAGNOSIS — J8410 Pulmonary fibrosis, unspecified: Secondary | ICD-10-CM | POA: Diagnosis not present

## 2024-07-01 DIAGNOSIS — Z8261 Family history of arthritis: Secondary | ICD-10-CM

## 2024-07-01 DIAGNOSIS — I5032 Chronic diastolic (congestive) heart failure: Secondary | ICD-10-CM | POA: Diagnosis not present

## 2024-07-01 DIAGNOSIS — Z887 Allergy status to serum and vaccine status: Secondary | ICD-10-CM

## 2024-07-01 DIAGNOSIS — E669 Obesity, unspecified: Secondary | ICD-10-CM | POA: Diagnosis not present

## 2024-07-01 DIAGNOSIS — D631 Anemia in chronic kidney disease: Secondary | ICD-10-CM | POA: Diagnosis not present

## 2024-07-01 DIAGNOSIS — Z9861 Coronary angioplasty status: Secondary | ICD-10-CM | POA: Diagnosis not present

## 2024-07-01 DIAGNOSIS — Z7984 Long term (current) use of oral hypoglycemic drugs: Secondary | ICD-10-CM

## 2024-07-01 DIAGNOSIS — J984 Other disorders of lung: Secondary | ICD-10-CM | POA: Diagnosis not present

## 2024-07-01 DIAGNOSIS — R1319 Other dysphagia: Secondary | ICD-10-CM | POA: Diagnosis not present

## 2024-07-01 DIAGNOSIS — Z888 Allergy status to other drugs, medicaments and biological substances status: Secondary | ICD-10-CM

## 2024-07-01 DIAGNOSIS — Z7951 Long term (current) use of inhaled steroids: Secondary | ICD-10-CM

## 2024-07-01 DIAGNOSIS — Z91010 Allergy to peanuts: Secondary | ICD-10-CM

## 2024-07-01 DIAGNOSIS — I129 Hypertensive chronic kidney disease with stage 1 through stage 4 chronic kidney disease, or unspecified chronic kidney disease: Secondary | ICD-10-CM | POA: Diagnosis not present

## 2024-07-01 DIAGNOSIS — K219 Gastro-esophageal reflux disease without esophagitis: Secondary | ICD-10-CM | POA: Diagnosis present

## 2024-07-01 DIAGNOSIS — Z860100 Personal history of colon polyps, unspecified: Secondary | ICD-10-CM

## 2024-07-01 DIAGNOSIS — N184 Chronic kidney disease, stage 4 (severe): Secondary | ICD-10-CM | POA: Diagnosis present

## 2024-07-01 DIAGNOSIS — K225 Diverticulum of esophagus, acquired: Secondary | ICD-10-CM | POA: Diagnosis not present

## 2024-07-01 DIAGNOSIS — Z881 Allergy status to other antibiotic agents status: Secondary | ICD-10-CM

## 2024-07-01 DIAGNOSIS — E119 Type 2 diabetes mellitus without complications: Secondary | ICD-10-CM | POA: Diagnosis not present

## 2024-07-01 DIAGNOSIS — N179 Acute kidney failure, unspecified: Secondary | ICD-10-CM | POA: Diagnosis not present

## 2024-07-01 DIAGNOSIS — I25110 Atherosclerotic heart disease of native coronary artery with unstable angina pectoris: Principal | ICD-10-CM | POA: Diagnosis present

## 2024-07-01 DIAGNOSIS — K449 Diaphragmatic hernia without obstruction or gangrene: Secondary | ICD-10-CM | POA: Diagnosis not present

## 2024-07-01 DIAGNOSIS — Z6829 Body mass index (BMI) 29.0-29.9, adult: Secondary | ICD-10-CM

## 2024-07-01 DIAGNOSIS — I252 Old myocardial infarction: Secondary | ICD-10-CM

## 2024-07-01 DIAGNOSIS — R079 Chest pain, unspecified: Principal | ICD-10-CM | POA: Diagnosis present

## 2024-07-01 DIAGNOSIS — J449 Chronic obstructive pulmonary disease, unspecified: Secondary | ICD-10-CM | POA: Diagnosis not present

## 2024-07-01 DIAGNOSIS — I200 Unstable angina: Secondary | ICD-10-CM | POA: Diagnosis not present

## 2024-07-01 DIAGNOSIS — I10 Essential (primary) hypertension: Secondary | ICD-10-CM | POA: Diagnosis not present

## 2024-07-01 DIAGNOSIS — Z8546 Personal history of malignant neoplasm of prostate: Secondary | ICD-10-CM

## 2024-07-01 DIAGNOSIS — R0789 Other chest pain: Secondary | ICD-10-CM | POA: Diagnosis not present

## 2024-07-01 DIAGNOSIS — Z955 Presence of coronary angioplasty implant and graft: Secondary | ICD-10-CM

## 2024-07-01 DIAGNOSIS — M109 Gout, unspecified: Secondary | ICD-10-CM | POA: Diagnosis present

## 2024-07-01 DIAGNOSIS — Z87891 Personal history of nicotine dependence: Secondary | ICD-10-CM

## 2024-07-01 DIAGNOSIS — I25119 Atherosclerotic heart disease of native coronary artery with unspecified angina pectoris: Secondary | ICD-10-CM | POA: Diagnosis not present

## 2024-07-01 LAB — CBC
HCT: 36.1 % — ABNORMAL LOW (ref 39.0–52.0)
Hemoglobin: 12.2 g/dL — ABNORMAL LOW (ref 13.0–17.0)
MCH: 32 pg (ref 26.0–34.0)
MCHC: 33.8 g/dL (ref 30.0–36.0)
MCV: 94.8 fL (ref 80.0–100.0)
Platelets: 216 K/uL (ref 150–400)
RBC: 3.81 MIL/uL — ABNORMAL LOW (ref 4.22–5.81)
RDW: 13.3 % (ref 11.5–15.5)
WBC: 10.4 K/uL (ref 4.0–10.5)
nRBC: 0 % (ref 0.0–0.2)

## 2024-07-01 LAB — TROPONIN I (HIGH SENSITIVITY): Troponin I (High Sensitivity): 43 ng/L — ABNORMAL HIGH (ref <18)

## 2024-07-01 LAB — BRAIN NATRIURETIC PEPTIDE: B Natriuretic Peptide: 83.5 pg/mL (ref 0.0–100.0)

## 2024-07-01 LAB — PROTIME-INR
INR: 1.1 (ref 0.8–1.2)
Prothrombin Time: 14.3 s (ref 11.4–15.2)

## 2024-07-01 MED ORDER — ONDANSETRON HCL 4 MG/2ML IJ SOLN
4.0000 mg | Freq: Once | INTRAMUSCULAR | Status: AC
  Administered 2024-07-01: 4 mg via INTRAVENOUS
  Filled 2024-07-01: qty 2

## 2024-07-01 MED ORDER — MORPHINE SULFATE (PF) 4 MG/ML IV SOLN
4.0000 mg | Freq: Once | INTRAVENOUS | Status: AC
  Administered 2024-07-01: 4 mg via INTRAVENOUS
  Filled 2024-07-01: qty 1

## 2024-07-01 MED ORDER — FUROSEMIDE 10 MG/ML IJ SOLN
40.0000 mg | Freq: Once | INTRAMUSCULAR | Status: AC
  Administered 2024-07-01: 40 mg via INTRAVENOUS
  Filled 2024-07-01: qty 4

## 2024-07-01 NOTE — ED Provider Notes (Incomplete)
 [facility] Provider Note    Event Date/Time   First MD Initiated Contact with Patient 07/01/24 2306     (approximate)   History   Chest Pain   HPI  Mike Decker is a 83 y.o. male with history of pulmonary fibrosis, diastolic heart failure, hypertension, diabetes, hyperlipidemia, anemia of chronic kidney disease, chronic kidney disease who presents to the emergency department with chest tightness, shortness of breath, abdominal swelling, weight gain of 5 pounds, orthopnea.  Tingling in the left arm.  Has also felt lightheaded.  Feels like he is volume overloaded.  No peripheral edema.  No fever but has had dry cough.  Patient is a retired Buyer, retail.  Cardiologist is Dr. Florencio.  Patient took 2 full dose aspirins at home, 3 nitroglycerin  prior to EMS arrival and was given 1 nitroglycerin  spray without relief.   History provided by patient.    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, esphageal   Chronic diastolic CHF (congestive heart failure) (HCC)    Colon polyp    Diabetes mellitus without complication (HCC)    Diverticulosis    Gout    Heart disease    Hemangioma    liver   Hyperlipidemia    Hypertension    Myocardial infarct [facility])    Ocular hypertension    Peripheral vascular disease (HCC)    Skin cancer    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HCC)    Sleep apnea    Vitreoretinal degeneration     Past Surgical History:  Procedure Laterality Date   CATARACT EXTRACTION  2011, 2012   COLONOSCOPY N/A 12/14/2018   Procedure: COLONOSCOPY;  Surgeon: Janalyn Keene NOVAK, MD;  Location: [facility] ENDOSCOPY;  Service: Endoscopy;  Laterality: N/A;   CORONARY ANGIOPLASTY WITH STENT PLACEMENT  12/10/2010   CORONARY STENT INTERVENTION N/A 12/29/2019   Procedure: CORONARY STENT INTERVENTION;  Surgeon:  Mady Bruckner, MD;  Location: [facility] INVASIVE CV LAB;  Service: Cardiovascular;  Laterality: N/A;   ESOPHAGOGASTRODUODENOSCOPY N/A 12/14/2018   Procedure: ESOPHAGOGASTRODUODENOSCOPY (EGD);  Surgeon: Janalyn Keene NOVAK, MD;  Location: [facility] ENDOSCOPY;  Service: Endoscopy;  Laterality: N/A;   ESOPHAGOGASTRODUODENOSCOPY (EGD) WITH PROPOFOL  N/A 06/01/2016   Procedure: ESOPHAGOGASTRODUODENOSCOPY (EGD) WITH PROPOFOL ;  Surgeon: Lamar ONEIDA Holmes, MD;  Location: [facility] ENDOSCOPY;  Service: Endoscopy;  Laterality: N/A;   ESOPHAGOGASTRODUODENOSCOPY (EGD) WITH PROPOFOL  N/A 01/31/2023   Procedure: ESOPHAGOGASTRODUODENOSCOPY (EGD) WITH PROPOFOL ;  Surgeon: Jinny Carmine, MD;  Location: [facility] ENDOSCOPY;  Service: Endoscopy;  Laterality: N/A;   FEMORAL ARTERY STENT Right    HERNIA REPAIR     x2   INTRAOCULAR LENS INSERTION     LEFT HEART CATH AND CORONARY ANGIOGRAPHY N/A 05/09/2017   Procedure: Left Heart Cath and Coronary Angiography;  Surgeon: Florencio Cara BIRCH, MD;  Location: [facility] INVASIVE CV LAB;  Service: Cardiovascular;  Laterality: N/A;   LEFT HEART CATH AND CORONARY ANGIOGRAPHY N/A 12/08/2018   Procedure: LEFT HEART CATH AND CORONARY ANGIOGRAPHY and possible PCI and stent;  Surgeon: Florencio Cara BIRCH, MD;  Location: [facility] INVASIVE CV LAB;  Service: Cardiovascular;  Laterality: N/A;   LEFT HEART CATH AND CORONARY ANGIOGRAPHY N/A 12/29/2019   Procedure: LEFT HEART CATH AND CORONARY ANGIOGRAPHY;  Surgeon: Mady Bruckner, MD;  Location: [facility] INVASIVE CV LAB;  Service: Cardiovascular;  Laterality: N/A;  LEFT HEART CATH AND CORONARY ANGIOGRAPHY Left 12/13/2022   Procedure: LEFT HEART CATH AND CORONARY ANGIOGRAPHY;  Surgeon: Florencio Cara BIRCH, MD;  Location: [facility] INVASIVE CV LAB;  Service: Cardiovascular;  Laterality: Left;   NOSE SURGERY     submucous resection   PROSTATE SURGERY  12/10/2000   ROTATOR CUFF REPAIR Right     MEDICATIONS:  Prior to Admission medications   Medication Sig Start Date End Date  Taking? Authorizing Provider  acetaminophen  (TYLENOL ) 500 MG tablet Take 1,000 mg by mouth every 8 (eight) hours as needed for mild pain.    [provider]  atorvastatin  (LIPITOR ) 80 MG tablet Take 1 tablet (80 mg total) by mouth daily. 04/25/23   Laurita Pillion, MD  azelastine  (ASTELIN ) 0.1 % nasal spray USE 1 TO 2 SPRAYS IN EACH NOSTRIL TWICE A DAY 06/01/20   [provider]  calcitRIOL  (ROCALTROL ) 0.25 MCG capsule Take 0.25 mcg by mouth daily.    [provider]  citalopram  (CELEXA ) 10 MG tablet Take 1 tablet (10 mg total) by mouth daily. 03/12/22   Britta King, MD  clopidogrel  (PLAVIX ) 75 MG tablet TAKE 1 TABLET BY MOUTH EVERY DAY 07/02/22   Britta King, MD  colchicine  0.6 MG tablet Take 0.6 mg by mouth 2 (two) times daily as needed.    [provider]  [employer] VITAMIN B12 1000 MCG tablet TAKE 1 TABLET BY MOUTH EVERY DAY 11/21/21   Babara Call, MD  diltiazem  (CARDIZEM ) 30 MG tablet Take 1 tablet (30 mg total) by mouth 3 (three) times daily. Do not take this medication if SBP less than 110 and/or HR less than 60 04/28/24 06/02/24  Trudy Anthony HERO, MD  docusate sodium  (COLACE) 100 MG capsule Take 100 mg by mouth 2 (two) times daily. Patient not taking: Reported on 06/02/2024    [provider]  EPINEPHrine  (EPI-PEN) 0.3 mg/0.3 mL SOAJ injection Inject into the muscle as needed (anaphylaxis).    [provider]  ergocalciferol  (VITAMIN D2) 1.25 MG (50000 UT) capsule Take 50,000 Units by mouth every 30 (thirty) days. Patient not taking: Reported on 06/02/2024    [provider]  ferrous sulfate  325 (65 FE) MG EC tablet TAKE 1 TABLET BY MOUTH EVERY DAY 02/06/21   Babara Call, MD  fluticasone  (FLONASE ) 50 MCG/ACT nasal spray USE 1 SPRAY IN EACH NOSTRIL EVERY DAY 09/12/20   Britta King, MD  furosemide  (LASIX ) 40 MG tablet Take 1 tablet by mouth daily. 04/29/24   [provider]  hydrALAZINE  (APRESOLINE ) 50 MG tablet Take 25 mg by mouth 2 (two)  times daily. 10/14/23   [provider]  insulin  aspart (NOVOLOG ) 100 UNIT/ML FlexPen Inject into the skin as needed. Take up to to 100 units daily in divided doses as advised 04/25/20   [provider]  insulin  glargine (LANTUS ) 100 UNIT/ML injection Inject 0.35 mLs (35 Units total) into the skin at bedtime. This is a decrease from your previous 45 units nightly. Patient not taking: Reported on 06/02/2024 12/30/19   Awanda City, MD  isosorbide  mononitrate (IMDUR ) 120 MG 24 hr tablet Take 1 tablet (120 mg total) by mouth 2 (two) times daily. 11/03/23   Alexander, Natalie, DO  JARDIANCE 10 MG TABS tablet Take 10 mg by mouth daily. 02/19/22   [provider]  magnesium  oxide (MAG-OX) 400 (240 Mg) MG tablet Take 1 tablet by mouth daily. 04/25/22   [provider]  methocarbamol  (ROBAXIN ) 500 MG tablet Take 500 mg by mouth  2 (two) times daily. 11/08/18   [provider]  metoprolol  succinate (TOPROL -XL) 100 MG 24 hr tablet Take 1 tablet (100 mg total) by mouth daily. Take with or immediately following a meal. 04/28/24 06/02/24  Trudy Anthony HERO, MD  mometasone  (ASMANEX ) 220 MCG/ACT inhaler Inhale 2 puffs into the lungs daily. 05/30/22   [provider]  montelukast  (SINGULAIR ) 10 MG tablet TAKE 1 TABLET AT BEDTIME 06/26/24   McDonough, Lauren K, PA-C  nitroGLYCERIN  (NITROSTAT ) 0.4 MG SL tablet Place 0.4 mg under the tongue every 5 (five) minutes x 3 doses as needed for chest pain.    [provider]  pantoprazole  (PROTONIX ) 40 MG tablet Take 1 tablet (40 mg total) by mouth 2 (two) times daily before a meal. 12/17/19   Amin, Ankit C, MD  ranolazine  (RANEXA ) 500 MG 12 hr tablet Take 1 tablet by mouth 2 (two) times daily.    [provider]  sodium fluoride  (DENTA 5000 PLUS) 1.1 % CREA dental cream See admin instructions. 01/03/22   [provider]  Tiotropium Bromide-Olodaterol (STIOLTO RESPIMAT) 2.5-2.5 MCG/ACT AERS Inhale 1 each into the  lungs daily.    [provider]  torsemide  (DEMADEX ) 20 MG tablet Take 40 mg by mouth 2 (two) times daily.    [provider]    Physical Exam   Triage Vital Signs: ED Triage Vitals  Encounter Vitals Group     BP 07/01/24 2259 (!) 149/50     Girls Systolic BP Percentile --      Girls Diastolic BP Percentile --      Boys Systolic BP Percentile --      Boys Diastolic BP Percentile --      Pulse Rate 07/01/24 2259 65     Resp 07/01/24 2259 18     Temp 07/01/24 2259 98.4 F (36.9 C)     Temp Source 07/01/24 2259 Oral     SpO2 07/01/24 2259 100 %     Weight 07/01/24 2303 185 lb (83.9 kg)     Height 07/01/24 2303 5' 6 (1.676 m)     Head Circumference --      Peak Flow --      Pain Score 07/01/24 2303 8     Pain Loc --      Pain Education --      Exclude from Growth Chart --     Most recent vital signs: Vitals:   07/01/24 2259 07/02/24 0101  BP: (!) 149/50 (!) 159/61  Pulse: 65   Resp: 18 16  Temp: 98.4 F (36.9 C) 98.4 F (36.9 C)  SpO2: 100% 98%    CONSTITUTIONAL: Alert, responds appropriately to questions. Well-appearing; well-nourished, pleasant HEAD: Normocephalic, atraumatic EYES: Conjunctivae clear, pupils appear equal, sclera nonicteric ENT: normal nose; moist mucous membranes NECK: Supple, normal ROM CARD: RRR; S1 and S2 appreciated RESP: Patient has diffuse crackles, no rhonchi or wheezing, no respiratory distress, no tachypnea ABD/GI: Distended, soft, non-tender, no rebound, no guarding, no peritoneal signs BACK: The back appears normal EXT: Normal ROM in all joints; no deformity noted, no edema, no calf swelling or calf tenderness SKIN: Normal color for age and race; warm; no rash on exposed skin NEURO: Moves all extremities equally, normal speech PSYCH: The patient's mood and manner are appropriate.   ED Results / Procedures / Treatments   LABS: (all labs ordered are listed, but only abnormal results are displayed) Labs Reviewed   BASIC METABOLIC PANEL WITH GFR - Abnormal; Notable for  the following components:      Result Value   Chloride 96 (*)    Glucose, Bld 201 (*)    BUN 106 (*)    Creatinine, Ser 4.35 (*)    GFR, Estimated 13 (*)    All other components within normal limits  CBC - Abnormal; Notable for the following components:   RBC 3.81 (*)    Hemoglobin 12.2 (*)    HCT 36.1 (*)    All other components within normal limits  URINALYSIS, ROUTINE W REFLEX MICROSCOPIC - Abnormal; Notable for the following components:   Color, Urine STRAW (*)    APPearance CLEAR (*)    Glucose, UA 150 (*)    All other components within normal limits  TROPONIN I (HIGH SENSITIVITY) - Abnormal; Notable for the following components:   Troponin I (High Sensitivity) 43 (*)    All other components within normal limits  TROPONIN I (HIGH SENSITIVITY) - Abnormal; Notable for the following components:   Troponin I (High Sensitivity) 51 (*)    All other components within normal limits  PROTIME-INR  BRAIN NATRIURETIC PEPTIDE     EKG:   Date: 07/01/2024  Rate: 63  Rhythm: normal sinus rhythm  QRS Axis: normal  Intervals: normal  ST/T Wave abnormalities: normal  Conduction Disutrbances: none  Narrative Interpretation: unremarkable    RADIOLOGY: My personal review and interpretation of imaging: Chest x-ray clear.  I have personally reviewed all radiology reports.   CT Chest Wo Contrast Result Date: 07/02/2024 EXAM: CT CHEST WITHOUT CONTRAST 07/02/2024 12:43:41 AM TECHNIQUE: CT of the chest was performed without the administration of intravenous contrast. Multiplanar reformatted images are provided for review. Automated exposure control, iterative reconstruction, and/or weight based adjustment of the mA/kV was utilized to reduce the radiation dose to as low as reasonably achievable. COMPARISON: 04/22/2023 CLINICAL HISTORY: Diffuse/interstitial lung disease. Per ed notes; Pt arrives via ems from home c/o pressure-like CP w/  tingling to left arm w/ sob that started 1 hour pta. Pt administered 324 ASA and 3 nitros prior to ems arrival w/out relief. Ems administered 1 nitroglycerin -spray en route, pt denied relief. Pt also reports dizziness all day. Pt on plavix . FINDINGS: MEDIASTINUM: Mild cardiomegaly. Thoracic aortic atherosclerosis, including the aortic root/valve. LYMPH NODES: No mediastinal, hilar or axillary lymphadenopathy. LUNGS AND PLEURA: Mild subpleural reticulation/fibrosis, lower lobe predominant, suggesting stable mild chronic lung disease. Scattered calcified granulomata, benign. No focal consolidation or pulmonary edema. No pleural effusion or pneumothorax. SOFT TISSUES/BONES: No acute abnormality of the bones or soft tissues. UPPER ABDOMEN: Limited images of the upper abdomen demonstrates no acute abnormality. IMPRESSION: 1. No acute cardiopulmonary abnormality. 2. Stable mild chronic lung disease. Electronically signed by: Pinkie Pebbles MD 07/02/2024 12:51 AM EDT RP Workstation: [id number]   DG Chest 1 View Result Date: 07/01/2024 CLINICAL DATA:  Pressure-like chest pain. EXAM: CHEST  1 VIEW COMPARISON:  Apr 26, 2024 FINDINGS: The heart size and mediastinal contours are within normal limits. Both lungs are clear. The visualized skeletal structures are unremarkable. IMPRESSION: No active disease. Electronically Signed   By: Suzen Dials M.D.   On: 07/01/2024 23:43     PROCEDURES:  Critical Care performed: YES   CRITICAL CARE Performed by: Josette Kaveri Perras   Total critical care time: 30 minutes  Critical care time was exclusive of separately billable procedures and treating other patients.  Critical care was necessary to treat or prevent imminent or life-threatening deterioration.  Critical care was time spent personally by me on the  following activities: development of treatment plan with patient and/or surrogate as well as nursing, discussions with consultants, evaluation of patient's response  to treatment, examination of patient, obtaining history from patient or surrogate, ordering and performing treatments and interventions, ordering and review of laboratory studies, ordering and review of radiographic studies, pulse oximetry and re-evaluation of patient's condition.      SABRA1-3 Lead EKG Interpretation  Performed by: Daviona Herbert, Josette SAILOR, DO Authorized by: Miaisabella Bacorn, Josette SAILOR, DO     Interpretation: normal     ECG rate:  65   ECG rate assessment: normal     Rhythm: sinus rhythm     Ectopy: none     Conduction: normal       IMPRESSION / MDM / ASSESSMENT AND PLAN / ED COURSE  I reviewed the triage vital signs and the nursing notes.    Patient here with shortness of breath, chest pain.  History of diastolic heart failure.  States this feels similar to when he had been admitted in May for CHF exacerbation.  The patient is on the cardiac monitor to evaluate for evidence of arrhythmia and/or significant heart rate changes.   DIFFERENTIAL DIAGNOSIS (includes but not limited to):   CHF exacerbation, ACS, PE, pneumonia, viral URI   Patient's presentation is most consistent with acute presentation with potential threat to life or bodily function.   PLAN: Will obtain cardiac labs, chest x-ray.  EKG shows no new ischemic change.  Will give morphine , Zofran  for symptomatic relief.   MEDICATIONS GIVEN IN ED: Medications  nitroGLYCERIN  50 mg in dextrose  5 % 250 mL (0.2 mg/mL) infusion (has no administration in time range)  morphine  (PF) 4 MG/ML injection 4 mg (4 mg Intravenous Given 07/01/24 2336)  ondansetron  (ZOFRAN ) injection 4 mg (4 mg Intravenous Given 07/01/24 2336)  furosemide  (LASIX ) injection 40 mg (40 mg Intravenous Given 07/01/24 2336)  morphine  (PF) 4 MG/ML injection 4 mg (4 mg Intravenous Given 07/02/24 0031)     ED COURSE: Patient first troponin is minimally elevated at 43 which appears to be his baseline.  Repeat is pending.  His BNP is normal but this could be falsely  low due to patient's BMI.  He does report feeling volume overloaded and does have crackles on exam.  His chest x-ray was reviewed and interpreted by myself and the radiologist and shows no acute abnormality.  I did obtain a noncontrast CT scan of his chest which also shows no edema, pleural effusion, pneumonia.  Patient's labs do show an acute kidney injury superimposed on chronic kidney disease.  Urine is normal.  Will admit to the hospitalist for continued monitoring given intractable chest pain with significant cardiac history, IV diuresis as needed for orthopnea, weight gain and abdominal swelling as well as monitoring of his worsening renal function.   2:02 AM  Pt's troponin went from 43 up to 51 which does appear to be slightly higher than he has run previously.  Still having 7/10 pain despite 2 rounds of IV morphine .  Will start heparin , nitroglycerin  infusion for possible unstable angina.  EKG nonischemic.  Patient appears comfortable.  Hemodynamically stable.  CONSULTS:  Consulted and discussed patient's case with hospitalist, Dr. Lawence.  I have recommended admission and consulting physician agrees and will place admission orders.  Patient (and family if present) agree with this plan.   I reviewed all nursing notes, vitals, pertinent previous records.  All labs, EKGs, imaging ordered have been independently reviewed and interpreted by myself.  OUTSIDE RECORDS REVIEWED: Reviewed patient's recent cardiology, hematology and nephrology notes.       FINAL CLINICAL IMPRESSION(S) / ED DIAGNOSES   Final diagnoses:  Acute kidney injury superimposed on chronic kidney disease (HCC)  Orthopnea  Nonspecific chest pain     Rx / DC Orders   ED Discharge Orders     None        Note:  This document was prepared using Dragon voice recognition software and may include unintentional dictation errors.      Khila Papp, Josette SAILOR, DO 07/02/24 [phone]

## 2024-07-01 NOTE — ED Triage Notes (Signed)
 Pt arrives via ems from home c/o pressure-like CP w/ tingling to left arm w/ sob that started 1 hour pta. Pt administered 324 ASA and 3 nitros prior to ems arrival w/out relief. Ems administered 1 nitroglycerin -spray en route, pt denied relief. Pt also reports dizziness all day. Pt on plavix .

## 2024-07-02 ENCOUNTER — Other Ambulatory Visit: Payer: Self-pay

## 2024-07-02 ENCOUNTER — Emergency Department

## 2024-07-02 DIAGNOSIS — R0601 Orthopnea: Secondary | ICD-10-CM | POA: Diagnosis present

## 2024-07-02 DIAGNOSIS — I130 Hypertensive heart and chronic kidney disease with heart failure and stage 1 through stage 4 chronic kidney disease, or unspecified chronic kidney disease: Secondary | ICD-10-CM | POA: Diagnosis present

## 2024-07-02 DIAGNOSIS — I440 Atrioventricular block, first degree: Secondary | ICD-10-CM | POA: Diagnosis present

## 2024-07-02 DIAGNOSIS — E669 Obesity, unspecified: Secondary | ICD-10-CM | POA: Diagnosis present

## 2024-07-02 DIAGNOSIS — N179 Acute kidney failure, unspecified: Secondary | ICD-10-CM | POA: Diagnosis present

## 2024-07-02 DIAGNOSIS — E1151 Type 2 diabetes mellitus with diabetic peripheral angiopathy without gangrene: Secondary | ICD-10-CM | POA: Diagnosis present

## 2024-07-02 DIAGNOSIS — I10 Essential (primary) hypertension: Secondary | ICD-10-CM

## 2024-07-02 DIAGNOSIS — I350 Nonrheumatic aortic (valve) stenosis: Secondary | ICD-10-CM | POA: Diagnosis present

## 2024-07-02 DIAGNOSIS — E785 Hyperlipidemia, unspecified: Secondary | ICD-10-CM | POA: Diagnosis present

## 2024-07-02 DIAGNOSIS — D631 Anemia in chronic kidney disease: Secondary | ICD-10-CM | POA: Diagnosis present

## 2024-07-02 DIAGNOSIS — N184 Chronic kidney disease, stage 4 (severe): Secondary | ICD-10-CM

## 2024-07-02 DIAGNOSIS — K219 Gastro-esophageal reflux disease without esophagitis: Secondary | ICD-10-CM

## 2024-07-02 DIAGNOSIS — J449 Chronic obstructive pulmonary disease, unspecified: Secondary | ICD-10-CM | POA: Diagnosis present

## 2024-07-02 DIAGNOSIS — I200 Unstable angina: Secondary | ICD-10-CM | POA: Diagnosis not present

## 2024-07-02 DIAGNOSIS — R54 Age-related physical debility: Secondary | ICD-10-CM | POA: Diagnosis present

## 2024-07-02 DIAGNOSIS — Z1152 Encounter for screening for COVID-19: Secondary | ICD-10-CM | POA: Diagnosis not present

## 2024-07-02 DIAGNOSIS — J941 Fibrothorax: Secondary | ICD-10-CM | POA: Diagnosis not present

## 2024-07-02 DIAGNOSIS — I25110 Atherosclerotic heart disease of native coronary artery with unstable angina pectoris: Secondary | ICD-10-CM | POA: Diagnosis present

## 2024-07-02 DIAGNOSIS — R079 Chest pain, unspecified: Secondary | ICD-10-CM

## 2024-07-02 DIAGNOSIS — E1122 Type 2 diabetes mellitus with diabetic chronic kidney disease: Secondary | ICD-10-CM | POA: Diagnosis present

## 2024-07-02 DIAGNOSIS — J8410 Pulmonary fibrosis, unspecified: Secondary | ICD-10-CM | POA: Diagnosis not present

## 2024-07-02 DIAGNOSIS — E1140 Type 2 diabetes mellitus with diabetic neuropathy, unspecified: Secondary | ICD-10-CM | POA: Diagnosis present

## 2024-07-02 DIAGNOSIS — R042 Hemoptysis: Secondary | ICD-10-CM | POA: Diagnosis present

## 2024-07-02 DIAGNOSIS — F32A Depression, unspecified: Secondary | ICD-10-CM | POA: Diagnosis present

## 2024-07-02 DIAGNOSIS — N2581 Secondary hyperparathyroidism of renal origin: Secondary | ICD-10-CM | POA: Diagnosis present

## 2024-07-02 DIAGNOSIS — Z794 Long term (current) use of insulin: Secondary | ICD-10-CM | POA: Diagnosis not present

## 2024-07-02 DIAGNOSIS — N189 Chronic kidney disease, unspecified: Secondary | ICD-10-CM

## 2024-07-02 DIAGNOSIS — I2489 Other forms of acute ischemic heart disease: Secondary | ICD-10-CM | POA: Diagnosis present

## 2024-07-02 DIAGNOSIS — E119 Type 2 diabetes mellitus without complications: Secondary | ICD-10-CM | POA: Diagnosis not present

## 2024-07-02 DIAGNOSIS — I959 Hypotension, unspecified: Secondary | ICD-10-CM | POA: Diagnosis present

## 2024-07-02 DIAGNOSIS — I5033 Acute on chronic diastolic (congestive) heart failure: Secondary | ICD-10-CM | POA: Diagnosis not present

## 2024-07-02 DIAGNOSIS — I5032 Chronic diastolic (congestive) heart failure: Secondary | ICD-10-CM | POA: Diagnosis present

## 2024-07-02 DIAGNOSIS — R0602 Shortness of breath: Secondary | ICD-10-CM | POA: Diagnosis not present

## 2024-07-02 DIAGNOSIS — J984 Other disorders of lung: Secondary | ICD-10-CM | POA: Diagnosis not present

## 2024-07-02 LAB — URINALYSIS, ROUTINE W REFLEX MICROSCOPIC
Bilirubin Urine: NEGATIVE
Glucose, UA: 150 mg/dL — AB
Hgb urine dipstick: NEGATIVE
Ketones, ur: NEGATIVE mg/dL
Leukocytes,Ua: NEGATIVE
Nitrite: NEGATIVE
Protein, ur: NEGATIVE mg/dL
Specific Gravity, Urine: 1.009 (ref 1.005–1.030)
pH: 6 (ref 5.0–8.0)

## 2024-07-02 LAB — BASIC METABOLIC PANEL WITH GFR
Anion gap: 13 (ref 5–15)
BUN: 106 mg/dL — ABNORMAL HIGH (ref 8–23)
CO2: 27 mmol/L (ref 22–32)
Calcium: 9 mg/dL (ref 8.9–10.3)
Chloride: 96 mmol/L — ABNORMAL LOW (ref 98–111)
Creatinine, Ser: 4.35 mg/dL — ABNORMAL HIGH (ref 0.61–1.24)
GFR, Estimated: 13 mL/min — ABNORMAL LOW (ref >60)
Glucose, Bld: 201 mg/dL — ABNORMAL HIGH (ref 70–99)
Potassium: 3.7 mmol/L (ref 3.5–5.1)
Sodium: 136 mmol/L (ref 135–145)

## 2024-07-02 LAB — APTT: aPTT: 32 s (ref 24–36)

## 2024-07-02 LAB — GLUCOSE, CAPILLARY: Glucose-Capillary: 289 mg/dL — ABNORMAL HIGH (ref 70–99)

## 2024-07-02 LAB — CBG MONITORING, ED: Glucose-Capillary: 229 mg/dL — ABNORMAL HIGH (ref 70–99)

## 2024-07-02 LAB — HEPARIN LEVEL (UNFRACTIONATED): Heparin Unfractionated: 0.52 [IU]/mL (ref 0.30–0.70)

## 2024-07-02 LAB — TROPONIN I (HIGH SENSITIVITY): Troponin I (High Sensitivity): 51 ng/L — ABNORMAL HIGH (ref <18)

## 2024-07-02 MED ORDER — ISOSORBIDE MONONITRATE ER 60 MG PO TB24
120.0000 mg | ORAL_TABLET | Freq: Two times a day (BID) | ORAL | Status: DC
Start: 2024-07-02 — End: 2024-07-05
  Administered 2024-07-02 – 2024-07-05 (×6): 120 mg via ORAL
  Filled 2024-07-02 (×6): qty 2

## 2024-07-02 MED ORDER — FUROSEMIDE 10 MG/ML IJ SOLN
40.0000 mg | Freq: Two times a day (BID) | INTRAMUSCULAR | Status: DC
  Filled 2024-07-02: qty 4

## 2024-07-02 MED ORDER — MONTELUKAST SODIUM 10 MG PO TABS
10.0000 mg | ORAL_TABLET | Freq: Every day | ORAL | Status: DC
  Administered 2024-07-02 – 2024-07-04 (×3): 10 mg via ORAL
  Filled 2024-07-02 (×3): qty 1

## 2024-07-02 MED ORDER — DILTIAZEM HCL 30 MG PO TABS
30.0000 mg | ORAL_TABLET | Freq: Three times a day (TID) | ORAL | Status: DC
  Administered 2024-07-02 – 2024-07-05 (×8): 30 mg via ORAL
  Filled 2024-07-02 (×8): qty 1

## 2024-07-02 MED ORDER — EPINEPHRINE 0.3 MG/0.3ML IJ SOAJ: 0.3000 mg | INTRAMUSCULAR | Status: DC | PRN

## 2024-07-02 MED ORDER — MAGNESIUM HYDROXIDE 400 MG/5ML PO SUSP
30.0000 mL | Freq: Every day | ORAL | Status: DC | PRN
  Administered 2024-07-03 – 2024-07-04 (×2): 30 mL via ORAL
  Filled 2024-07-02 (×2): qty 30

## 2024-07-02 MED ORDER — INSULIN ASPART 100 UNIT/ML IJ SOLN
0.0000 [IU] | Freq: Three times a day (TID) | INTRAMUSCULAR | Status: DC
  Administered 2024-07-02 – 2024-07-04 (×5): 3 [IU] via SUBCUTANEOUS
  Administered 2024-07-04: 2 [IU] via SUBCUTANEOUS
  Administered 2024-07-05: 3 [IU] via SUBCUTANEOUS
  Administered 2024-07-05: 2 [IU] via SUBCUTANEOUS
  Filled 2024-07-02 (×8): qty 1

## 2024-07-02 MED ORDER — CLOPIDOGREL BISULFATE 75 MG PO TABS
75.0000 mg | ORAL_TABLET | Freq: Every day | ORAL | Status: DC
Start: 2024-07-02 — End: 2024-07-03
  Administered 2024-07-02 – 2024-07-03 (×2): 75 mg via ORAL
  Filled 2024-07-02 (×2): qty 1

## 2024-07-02 MED ORDER — TRAZODONE HCL 50 MG PO TABS
25.0000 mg | ORAL_TABLET | Freq: Every evening | ORAL | Status: DC | PRN
Start: 2024-07-02 — End: 2024-07-05
  Administered 2024-07-02 – 2024-07-04 (×3): 25 mg via ORAL
  Filled 2024-07-02 (×3): qty 1

## 2024-07-02 MED ORDER — SIMETHICONE 80 MG PO CHEW
160.0000 mg | CHEWABLE_TABLET | Freq: Once | ORAL | Status: AC
  Administered 2024-07-03: 160 mg via ORAL
  Filled 2024-07-02: qty 2

## 2024-07-02 MED ORDER — PANTOPRAZOLE SODIUM 40 MG PO TBEC
40.0000 mg | DELAYED_RELEASE_TABLET | Freq: Two times a day (BID) | ORAL | Status: DC
  Administered 2024-07-02 – 2024-07-05 (×7): 40 mg via ORAL
  Filled 2024-07-02 (×7): qty 1

## 2024-07-02 MED ORDER — MORPHINE SULFATE (PF) 4 MG/ML IV SOLN
4.0000 mg | Freq: Once | INTRAVENOUS | Status: AC
  Administered 2024-07-02: 4 mg via INTRAVENOUS
  Filled 2024-07-02: qty 1

## 2024-07-02 MED ORDER — NITROGLYCERIN IN D5W 200-5 MCG/ML-% IV SOLN
0.0000 ug/min | INTRAVENOUS | Status: DC
  Administered 2024-07-02: 5 ug/min via INTRAVENOUS
  Filled 2024-07-02: qty 250

## 2024-07-02 MED ORDER — ALUM & MAG HYDROXIDE-SIMETH 200-200-20 MG/5ML PO SUSP
30.0000 mL | ORAL | Status: DC | PRN
  Administered 2024-07-02 – 2024-07-04 (×6): 30 mL via ORAL
  Filled 2024-07-02 (×6): qty 30

## 2024-07-02 MED ORDER — VITAMIN B-12 1000 MCG PO TABS
1000.0000 ug | ORAL_TABLET | Freq: Every day | ORAL | Status: DC
  Administered 2024-07-02 – 2024-07-05 (×4): 1000 ug via ORAL
  Filled 2024-07-02 (×3): qty 1

## 2024-07-02 MED ORDER — CITALOPRAM HYDROBROMIDE 20 MG PO TABS
10.0000 mg | ORAL_TABLET | Freq: Every day | ORAL | Status: DC
  Administered 2024-07-02 – 2024-07-05 (×4): 10 mg via ORAL
  Filled 2024-07-02 (×4): qty 1

## 2024-07-02 MED ORDER — FERROUS SULFATE 325 (65 FE) MG PO TABS
325.0000 mg | ORAL_TABLET | Freq: Every day | ORAL | Status: DC
  Administered 2024-07-02 – 2024-07-05 (×4): 325 mg via ORAL
  Filled 2024-07-02 (×5): qty 1

## 2024-07-02 MED ORDER — METOLAZONE 2.5 MG PO TABS
2.5000 mg | ORAL_TABLET | ORAL | Status: DC
  Administered 2024-07-02: 2.5 mg via ORAL
  Filled 2024-07-02: qty 1

## 2024-07-02 MED ORDER — INSULIN ASPART 100 UNIT/ML IJ SOLN
0.0000 [IU] | Freq: Every day | INTRAMUSCULAR | Status: DC
  Administered 2024-07-02 – 2024-07-03 (×2): 3 [IU] via SUBCUTANEOUS
  Administered 2024-07-04: 4 [IU] via SUBCUTANEOUS
  Filled 2024-07-02 (×3): qty 1

## 2024-07-02 MED ORDER — ONDANSETRON HCL 4 MG/2ML IJ SOLN
4.0000 mg | Freq: Four times a day (QID) | INTRAMUSCULAR | Status: DC | PRN
  Administered 2024-07-04 – 2024-07-05 (×2): 4 mg via INTRAVENOUS
  Filled 2024-07-02 (×2): qty 2

## 2024-07-02 MED ORDER — ACETAMINOPHEN 325 MG PO TABS
650.0000 mg | ORAL_TABLET | Freq: Four times a day (QID) | ORAL | Status: DC | PRN
  Administered 2024-07-02 – 2024-07-04 (×2): 650 mg via ORAL
  Filled 2024-07-02 (×2): qty 2

## 2024-07-02 MED ORDER — HEPARIN BOLUS VIA INFUSION
4000.0000 [IU] | Freq: Once | INTRAVENOUS | Status: AC
  Administered 2024-07-02: 4000 [IU] via INTRAVENOUS
  Filled 2024-07-02: qty 4000

## 2024-07-02 MED ORDER — CALCITRIOL 0.25 MCG PO CAPS
0.2500 ug | ORAL_CAPSULE | Freq: Every day | ORAL | Status: DC
  Administered 2024-07-02 – 2024-07-05 (×4): 0.25 ug via ORAL
  Filled 2024-07-02 (×4): qty 1

## 2024-07-02 MED ORDER — ACETAMINOPHEN 650 MG RE SUPP: 650.0000 mg | Freq: Four times a day (QID) | RECTAL | Status: DC | PRN

## 2024-07-02 MED ORDER — ONDANSETRON HCL 4 MG PO TABS: 4.0000 mg | ORAL_TABLET | Freq: Four times a day (QID) | ORAL | Status: DC | PRN

## 2024-07-02 MED ORDER — ISOSORBIDE MONONITRATE ER 60 MG PO TB24: 120.0000 mg | ORAL_TABLET | Freq: Two times a day (BID) | ORAL | Status: DC

## 2024-07-02 MED ORDER — TORSEMIDE 20 MG PO TABS
40.0000 mg | ORAL_TABLET | Freq: Two times a day (BID) | ORAL | Status: DC
  Administered 2024-07-02 – 2024-07-03 (×2): 40 mg via ORAL
  Filled 2024-07-02 (×2): qty 2

## 2024-07-02 MED ORDER — NITROGLYCERIN 0.4 MG SL SUBL: 0.4000 mg | SUBLINGUAL_TABLET | SUBLINGUAL | Status: DC | PRN

## 2024-07-02 MED ORDER — METOLAZONE 2.5 MG PO TABS: 2.5000 mg | ORAL_TABLET | ORAL | Status: DC

## 2024-07-02 MED ORDER — METOPROLOL SUCCINATE ER 100 MG PO TB24
100.0000 mg | ORAL_TABLET | Freq: Every day | ORAL | Status: DC
  Administered 2024-07-02 – 2024-07-05 (×2): 100 mg via ORAL
  Filled 2024-07-02: qty 1
  Filled 2024-07-02: qty 2

## 2024-07-02 MED ORDER — HYDRALAZINE HCL 25 MG PO TABS
25.0000 mg | ORAL_TABLET | Freq: Two times a day (BID) | ORAL | Status: DC
  Administered 2024-07-02 – 2024-07-05 (×6): 25 mg via ORAL
  Filled 2024-07-02 (×6): qty 1

## 2024-07-02 MED ORDER — FLUTICASONE PROPIONATE 50 MCG/ACT NA SUSP: 2.0000 | Freq: Every day | NASAL | Status: DC | PRN

## 2024-07-02 MED ORDER — COLCHICINE 0.6 MG PO TABS: 0.6000 mg | ORAL_TABLET | Freq: Two times a day (BID) | ORAL | Status: DC | PRN

## 2024-07-02 MED ORDER — HEPARIN (PORCINE) 25000 UT/250ML-% IV SOLN
1000.0000 [IU]/h | INTRAVENOUS | Status: DC
  Administered 2024-07-02: 1000 [IU]/h via INTRAVENOUS
  Filled 2024-07-02: qty 250

## 2024-07-02 MED ORDER — HEPARIN SODIUM (PORCINE) 5000 UNIT/ML IJ SOLN
5000.0000 [IU] | Freq: Three times a day (TID) | INTRAMUSCULAR | Status: DC
  Administered 2024-07-02 – 2024-07-05 (×7): 5000 [IU] via SUBCUTANEOUS
  Filled 2024-07-02 (×9): qty 1

## 2024-07-02 MED ORDER — RANOLAZINE ER 500 MG PO TB12
500.0000 mg | ORAL_TABLET | Freq: Two times a day (BID) | ORAL | Status: DC
  Administered 2024-07-02 – 2024-07-05 (×7): 500 mg via ORAL
  Filled 2024-07-02 (×8): qty 1

## 2024-07-02 MED ORDER — ATORVASTATIN CALCIUM 80 MG PO TABS
80.0000 mg | ORAL_TABLET | Freq: Every day | ORAL | Status: DC
  Administered 2024-07-02 – 2024-07-05 (×4): 80 mg via ORAL
  Filled 2024-07-02: qty 1
  Filled 2024-07-02: qty 4
  Filled 2024-07-02 (×2): qty 1

## 2024-07-02 MED ORDER — METHOCARBAMOL 500 MG PO TABS
500.0000 mg | ORAL_TABLET | Freq: Two times a day (BID) | ORAL | Status: DC
  Administered 2024-07-02 – 2024-07-05 (×7): 500 mg via ORAL
  Filled 2024-07-02 (×7): qty 1

## 2024-07-02 NOTE — Progress Notes (Signed)
 Progress Note   Patient: Mike Decker FMW:[id number] DOB: 03/16/1941 DOA: 07/01/2024     0 DOS: the patient was seen and examined on 07/02/2024   Brief hospital course: Taken from H&P.  Mike Decker is a 83 y.o. male with medical history significant for osteoarthritis, type 2 diabetes mellitus, diverticulosis, gout, coronary artery disease, hypertension, dyslipidemia and chronic diastolic CHF, mild to moderate AS, who presented to the emergency room with acute onset of chest pain felt chest tightness with graded 8/10 in severity with associated nausea and radiation to his left arm and associated dyspnea as well as abdominal distention.  He admitted to recent weight gain of 6 pounds in 3 days.  He has been having orthopnea and feels he is volume overloaded.   On presentation mildly elevated blood pressure at 149/50 otherwise stable vital, labs with glucose of 201, BUN 106, creatinine 4.35, troponin 43>> 51, BNP 83.5. EKG with NSR, borderline prolonged PR interval. CXR with no acute cardiopulmonary disease.  Patient received IV Lasix  along with a dose of morphine  and Zofran  in ED.  He was placed on nitroglycerin  drip due to recurrent chest pain, also started on heparin  infusion.  Cardiology was consulted.  7/24: Blood pressure elevated at 168/65, UA with some glucosuria only.  Continue to have intermittent chest pain-no exertional component.  No orthopnea, he chronically sleeps on a wedge due to history of GERD and Barrett's esophagus.  No recent change.  Clinically appears euvolemic with normal BNP.  Cardiology discontinued heparin  and nitroglycerin  infusion. Likely unstable angina which also seems chronic.    Assessment and Plan: * Unstable angina Mike Decker) Patient with intermittent pressure-like chest pain radiating to left arm, seems chronic.  Borderline troponin with a flat curve. Cardiology is on board. Discontinuing heparin  infusion as symptoms seems chronic No further plan  for any workup Cardiology is also recommending to wean off from nitroglycerin  drip -Restarting home metoprolol , Imdur  and Ranexa  - Continue home Plavix  and statin  CKD (chronic kidney disease), stage IV (HCC) Concern of AKI with CKD stage IV Creatinine above 4 with baseline seems to be around 2.8-3 -Nephrology was consulted -Monitor renal function -Avoid nephrotoxins  Acute on chronic diastolic CHF (congestive heart failure) (HCC) Acute on chronic HFpEF has been ruled out.  Clinically appears euvolemic.  No orthopnea, normal BNP and chest x-ray without any pulmonary vascular congestion.  Chronic exertional dyspnea. Did receive a dose of IV Lasix . - Cardiology is on board -Restarting home torsemide  and metolazone  -Monitor volume status closely  Essential hypertension - Continuing home Cardizem , torsemide , hydralazine , Imdur  and metoprolol   Type 2 diabetes mellitus without complication, with long-term current use of insulin  (HCC) - SSI  Chronic GERD without esophagitis - Will continue PPI therapy.  Depression - Will continue Celexa   Dyslipidemia - Will continue statin therapy.   Subjective: Patient continued to have some intermittent chest pain, feels like pressure and radiating to left arm.  No orthopnea, patient was sleeping on a wedge pillow for a long time, no recent change.  He was keeping little upright due to GERD and history of Barrett's esophagus.  Physical Exam: Vitals:   07/02/24 1000 07/02/24 1030 07/02/24 1100 07/02/24 1206  BP: (!) 146/69 (!) 144/59 (!) 136/59   Pulse: 69 64 67   Resp: 18 10 17    Temp:    98 F (36.7 C)  TempSrc:    Oral  SpO2: 100% 100% 95%   Weight:      Height:  General.  Frail elderly man, in no acute distress. Pulmonary.  Lungs clear bilaterally, normal respiratory effort. CV.  Regular rate and rhythm, positive murmur. Abdomen.  Soft, nontender, nondistended, BS positive. CNS.  Alert and oriented .  No focal neurologic  deficit. Extremities.  No edema, no cyanosis, pulses intact and symmetrical. Psychiatry.  Judgment and insight appears normal.   Data Reviewed: Prior data reviewed  Family Communication: Talked with wife on phone.  Disposition: Status is: Inpatient Remains inpatient appropriate because: Severity of illness  Planned Discharge Destination: Home with Home Health  Time spent:  minutes  This record has been created using Conservation officer, historic buildings. Errors have been sought and corrected,but may not always be located. Such creation errors do not reflect on the standard of care.   Author: Amaryllis Dare, MD 07/02/2024 1:40 PM  For on call review [email or web address].

## 2024-07-02 NOTE — Consult Note (Signed)
 PHARMACY - ANTICOAGULATION CONSULT NOTE  Pharmacy Consult for Heparin  Indication: chest pain/ACS  Allergies  Allergen Reactions   Gabapentin      Other reaction(s): Other (See Comments) Tremors   Peanut-Containing Drug Products Anaphylaxis   Penicillins Hives and Rash    Per notes - patient tolerated cefdinir in 2022    Bee Venom Swelling   Influenza Vaccines Hives   Inh [Isoniazid ] Hives   Kenalog [Triamcinolone Acetonide] Hives   Levaquin  [Levofloxacin ] Other (See Comments)    Tendon, ligament pain.    Lisinopril      Other reaction(s): Hyperkalemia   Nalfon [Fenoprofen Calcium ] Hives   Naproxen    Peanut Oil    Nsaids Rash    Nalfon 600 Nalfon 600    Patient Measurements: Height: 5' 6 (167.6 cm) Weight: 83.9 kg (185 lb) IBW/kg (Calculated) : 63.8 HEPARIN  DW (KG): 81  Vital Signs: Temp: 98 F (36.7 C) (07/24 0800) Temp Source: Oral (07/24 0800) BP: 136/59 (07/24 1100) Pulse Rate: 67 (07/24 1100)  Labs: Recent Labs    07/01/24 2305 07/02/24 0113 07/02/24 0212 07/02/24 1123  HGB 12.2*  --   --   --   HCT 36.1*  --   --   --   PLT 216  --   --   --   APTT  --   --  32  --   LABPROT 14.3  --   --   --   INR 1.1  --   --   --   HEPARINUNFRC  --   --   --  0.52  CREATININE 4.35*  --   --   --   TROPONINIHS 43* 51*  --   --     Estimated Creatinine Clearance: 12.8 mL/min (A) (by C-G formula based on SCr of 4.35 mg/dL (H)).   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 male with history of pulmonary fibrosis, diastolic heart failure, hypertension, diabetes, hyperlipidemia, anemia of chronic kidney disease, chronic kidney disease who presents to the emergency department with chest tightness, shortness of breath, abdominal swelling, weight gain of 5 pounds, orthopnea. Troponin levels of 43 and 51. Pharmacy has been consulted to initiate and monitor continuous heparin  infusion.   Baseline labs: aPTT pending, INR 1.1, Hgb 12.2, Plts 216  Results Date Time Results Comments 7/24 1123 HL=0.52 Therapeutic x 1 at 1000 units/hours  Goal of Therapy:  Heparin  level 0.3-0.7 units/ml Monitor platelets by anticoagulation protocol: Yes   Plan:  Heparin  level therapeutic. Will continue heparin  infusion at 1000 units/hr Check HL in 8 hours to conform therapeutic rate and daily while on heparin  Continue to monitor H&H and platelets  Acasia Skilton Rodriguez-Guzman PharmD, BCPS 07/02/2024 11:57 AM

## 2024-07-02 NOTE — Assessment & Plan Note (Addendum)
-   Continuing home Cardizem , torsemide , hydralazine , Imdur  and metoprolol

## 2024-07-02 NOTE — ED Notes (Signed)
 Pt called out report 6/10 CP. Pt's nitroglycerin  gtt titrated at this time. See MAR,

## 2024-07-02 NOTE — Assessment & Plan Note (Addendum)
 Acute on chronic HFpEF has been ruled out.  Clinically appears euvolemic.  No orthopnea, normal BNP and chest x-ray without any pulmonary vascular congestion.  Chronic exertional dyspnea. Did receive a dose of IV Lasix . - Cardiology is on board -Restarting home torsemide  and metolazone  -Monitor volume status closely

## 2024-07-02 NOTE — Assessment & Plan Note (Signed)
 Will continue PPI therapy.

## 2024-07-02 NOTE — H&P (Addendum)
 [facility]   PATIENT NAME: Mike Decker    MR#:  [id number]  DATE OF BIRTH:  13-Apr-1941  DATE OF ADMISSION:  07/01/2024  PRIMARY CARE PHYSICIAN: Liana Fish, NP   Patient is coming from: Home  REQUESTING/REFERRING PHYSICIAN: Ward, Josette SAILOR, DO  CHIEF COMPLAINT:   Chief Complaint  Patient presents with   Chest Pain    HISTORY OF PRESENT ILLNESS:  Mike Decker is a 83 y.o. male with medical history significant for osteoarthritis, type 2 diabetes mellitus, diverticulosis, gout, coronary artery disease, hypertension, dyslipidemia and chronic diastolic CHF, who presented to the emergency room with acute onset of chest pain felt chest tightness with graded 8/10 in severity with associated nausea and radiation to his left arm and associated dyspnea as well as abdominal distention.  He admitted to recent weight gain of 6 pounds in 3 days.  He has been having orthopnea and feels he is volume overloaded.  He denied any nausea or vomiting.  He has been having dry cough with occasional wheezing.  He admits to lightheadedness without dizziness or blurred vision, paresthesias or focal muscle weakness.    ED Course: When he came to the ER, BP was 149/50 with otherwise normal vital signs.  Labs revealed a chloride of 96 with a glucose 201, BUN of 106 and creatinine 4.35.  High-sensitivity troponin was 43 and later 51 and BNP 83.5.  CBC showed anemia hemoglobin 12.2 and 36.1 better than the previous levels.  PT/INR on as well as PTT were within normal. EKG as reviewed by me : Normal saline continue with TPN seen daily daily.  Remove the.  Consult EKG showed normal sinus rhythm with a rate of 63 with borderline prolonged PR interval. Imaging: Portable chest x-ray showed no acute cardiopulmonary disease.  The patient was given 40 mg IV Lasix , 4 mg of IV morphine  sulfate and 4 mg of IV Zofran  and later received another dose of morphine .  He was placed on IV nitroglycerin  drip due to  recurrent chest pain and was started on IV heparin  with bolus and drip.  He will be admitted to a cardiac telemetry bed for further evaluation and management. 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, esphageal   Chronic diastolic CHF (congestive heart failure) (HCC)    Colon polyp    Diabetes mellitus without complication (HCC)    Diverticulosis    Gout    Heart disease    Hemangioma    liver   Hyperlipidemia    Hypertension    Myocardial infarct [facility])    Ocular hypertension    Peripheral vascular disease (HCC)    Skin cancer    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HCC)    Sleep apnea    Vitreoretinal degeneration     PAST SURGICAL HISTORY:   Past Surgical History:  Procedure Laterality Date   CATARACT EXTRACTION  2011, 2012   COLONOSCOPY N/A 12/14/2018   Procedure: COLONOSCOPY;  Surgeon: Janalyn Keene NOVAK, MD;  Location: [facility] ENDOSCOPY;  Service: Endoscopy;  Laterality: N/A;   CORONARY ANGIOPLASTY WITH STENT PLACEMENT  12/10/2010   CORONARY STENT INTERVENTION N/A 12/29/2019   Procedure: CORONARY STENT INTERVENTION;  Surgeon: Mady Bruckner, MD;  Location: [facility] INVASIVE CV LAB;  Service: Cardiovascular;  Laterality:  N/A;   ESOPHAGOGASTRODUODENOSCOPY N/A 12/14/2018   Procedure: ESOPHAGOGASTRODUODENOSCOPY (EGD);  Surgeon: Janalyn Keene NOVAK, MD;  Location: [facility] ENDOSCOPY;  Service: Endoscopy;  Laterality: N/A;   ESOPHAGOGASTRODUODENOSCOPY (EGD) WITH PROPOFOL  N/A 06/01/2016   Procedure: ESOPHAGOGASTRODUODENOSCOPY (EGD) WITH PROPOFOL ;  Surgeon: Lamar ONEIDA Holmes, MD;  Location: [facility] ENDOSCOPY;  Service: Endoscopy;  Laterality: N/A;   ESOPHAGOGASTRODUODENOSCOPY (EGD) WITH PROPOFOL  N/A 01/31/2023   Procedure: ESOPHAGOGASTRODUODENOSCOPY (EGD) WITH PROPOFOL ;  Surgeon: Jinny Carmine, MD;  Location: [facility] ENDOSCOPY;  Service:  Endoscopy;  Laterality: N/A;   FEMORAL ARTERY STENT Right    HERNIA REPAIR     x2   INTRAOCULAR LENS INSERTION     LEFT HEART CATH AND CORONARY ANGIOGRAPHY N/A 05/09/2017   Procedure: Left Heart Cath and Coronary Angiography;  Surgeon: Florencio Cara BIRCH, MD;  Location: [facility] INVASIVE CV LAB;  Service: Cardiovascular;  Laterality: N/A;   LEFT HEART CATH AND CORONARY ANGIOGRAPHY N/A 12/08/2018   Procedure: LEFT HEART CATH AND CORONARY ANGIOGRAPHY and possible PCI and stent;  Surgeon: Florencio Cara BIRCH, MD;  Location: [facility] INVASIVE CV LAB;  Service: Cardiovascular;  Laterality: N/A;   LEFT HEART CATH AND CORONARY ANGIOGRAPHY N/A 12/29/2019   Procedure: LEFT HEART CATH AND CORONARY ANGIOGRAPHY;  Surgeon: Mady Bruckner, MD;  Location: [facility] INVASIVE CV LAB;  Service: Cardiovascular;  Laterality: N/A;   LEFT HEART CATH AND CORONARY ANGIOGRAPHY Left 12/13/2022   Procedure: LEFT HEART CATH AND CORONARY ANGIOGRAPHY;  Surgeon: Florencio Cara BIRCH, MD;  Location: [facility] INVASIVE CV LAB;  Service: Cardiovascular;  Laterality: Left;   NOSE SURGERY     submucous resection   PROSTATE SURGERY  12/10/2000   ROTATOR CUFF REPAIR Right     SOCIAL HISTORY:   Social History   Tobacco Use   Smoking status: Former    Current packs/day: 0.00    Types: Cigarettes    Start date: 12/10/1956    Quit date: 12/10/1976    Years since quitting: 47.5   Smokeless tobacco: Never  Substance Use Topics   Alcohol use: No    FAMILY HISTORY:   Family History  Problem Relation Age of Onset   Arthritis Mother    Stroke Maternal Grandfather    Breast cancer Neg Hx     DRUG ALLERGIES:   Allergies  Allergen Reactions   Gabapentin      Other reaction(s): Other (See Comments) Tremors   Peanut-Containing Drug Products Anaphylaxis   Penicillins Hives and Rash    Per notes - patient tolerated cefdinir in 2022    Bee Venom Swelling   Influenza Vaccines Hives   Inh [Isoniazid ] Hives   Kenalog [Triamcinolone Acetonide]  Hives   Levaquin  [Levofloxacin ] Other (See Comments)    Tendon, ligament pain.    Lisinopril      Other reaction(s): Hyperkalemia   Nalfon [Fenoprofen Calcium ] Hives   Naproxen    Peanut Oil    Nsaids Rash    Nalfon 600 Nalfon 600    REVIEW OF SYSTEMS:   ROS As per history of present illness. All pertinent systems were reviewed above. Constitutional, HEENT, cardiovascular, respiratory, GI, GU, musculoskeletal, neuro, psychiatric, endocrine, integumentary and hematologic systems were reviewed and are otherwise negative/unremarkable except for positive findings mentioned above in the HPI.   MEDICATIONS AT HOME:   Prior to Admission medications   Medication Sig Start Date End Date Taking? Authorizing Provider  metolazone  (ZAROXOLYN ) 2.5 MG tablet Take 2.5 mg by mouth once a week. 06/04/24 06/04/25 Yes [provider]  acetaminophen  (TYLENOL ) 500 MG tablet  Take 1,000 mg by mouth every 8 (eight) hours as needed for mild pain.    [provider]  atorvastatin  (LIPITOR ) 80 MG tablet Take 1 tablet (80 mg total) by mouth daily. 04/25/23   Laurita Pillion, MD  azelastine  (ASTELIN ) 0.1 % nasal spray USE 1 TO 2 SPRAYS IN EACH NOSTRIL TWICE A DAY 06/01/20   [provider]  calcitRIOL  (ROCALTROL ) 0.25 MCG capsule Take 0.25 mcg by mouth daily.    [provider]  citalopram  (CELEXA ) 10 MG tablet Take 1 tablet (10 mg total) by mouth daily. 03/12/22   Britta King, MD  clopidogrel  (PLAVIX ) 75 MG tablet TAKE 1 TABLET BY MOUTH EVERY DAY 07/02/22   Britta King, MD  colchicine  0.6 MG tablet Take 0.6 mg by mouth 2 (two) times daily as needed.    [provider]  [facility] VITAMIN B12 1000 MCG tablet TAKE 1 TABLET BY MOUTH EVERY DAY 11/21/21   Babara Call, MD  diltiazem  (CARDIZEM ) 30 MG tablet Take 1 tablet (30 mg total) by mouth 3 (three) times daily. Do not take this medication if SBP less than 110 and/or HR less than 60 04/28/24 06/02/24  Trudy Anthony HERO, MD  docusate  sodium (COLACE) 100 MG capsule Take 100 mg by mouth 2 (two) times daily. Patient not taking: Reported on 06/02/2024    [provider]  EPINEPHrine  (EPI-PEN) 0.3 mg/0.3 mL SOAJ injection Inject into the muscle as needed (anaphylaxis).    [provider]  ergocalciferol  (VITAMIN D2) 1.25 MG (50000 UT) capsule Take 50,000 Units by mouth every 30 (thirty) days. Patient not taking: Reported on 06/02/2024    [provider]  ferrous sulfate  325 (65 FE) MG EC tablet TAKE 1 TABLET BY MOUTH EVERY DAY 02/06/21   Babara Call, MD  fluticasone  (FLONASE ) 50 MCG/ACT nasal spray USE 1 SPRAY IN EACH NOSTRIL EVERY DAY 09/12/20   Britta King, MD  furosemide  (LASIX ) 40 MG tablet Take 1 tablet by mouth daily. 04/29/24   [provider]  hydrALAZINE  (APRESOLINE ) 50 MG tablet Take 25 mg by mouth 2 (two) times daily. 10/14/23   [provider]  insulin  aspart (NOVOLOG ) 100 UNIT/ML FlexPen Inject into the skin as needed. Take up to to 100 units daily in divided doses as advised 04/25/20   [provider]  insulin  glargine (LANTUS ) 100 UNIT/ML injection Inject 0.35 mLs (35 Units total) into the skin at bedtime. This is a decrease from your previous 45 units nightly. Patient not taking: Reported on 06/02/2024 12/30/19   Awanda City, MD  isosorbide  mononitrate (IMDUR ) 120 MG 24 hr tablet Take 1 tablet (120 mg total) by mouth 2 (two) times daily. 11/03/23   Alexander, Natalie, DO  JARDIANCE 10 MG TABS tablet Take 10 mg by mouth daily. 02/19/22   [provider]  magnesium  oxide (MAG-OX) 400 (240 Mg) MG tablet Take 1 tablet by mouth daily. 04/25/22   [provider]  methocarbamol  (ROBAXIN ) 500 MG tablet Take 500 mg by mouth 2 (two) times daily. 11/08/18   [provider]  metoprolol  succinate (TOPROL -XL) 100 MG 24 hr tablet Take 1 tablet (100 mg total) by mouth daily. Take with or immediately following a meal. 04/28/24 06/02/24  Trudy Anthony HERO, MD   mometasone  (ASMANEX ) 220 MCG/ACT inhaler Inhale 2 puffs into the lungs daily. 05/30/22   [provider]  montelukast  (SINGULAIR ) 10 MG tablet TAKE 1 TABLET AT BEDTIME 06/26/24   McDonough, Lauren K, PA-C  nitroGLYCERIN  (NITROSTAT ) 0.4 MG  SL tablet Place 0.4 mg under the tongue every 5 (five) minutes x 3 doses as needed for chest pain.    [provider]  pantoprazole  (PROTONIX ) 40 MG tablet Take 1 tablet (40 mg total) by mouth 2 (two) times daily before a meal. 12/17/19   Amin, Ankit C, MD  ranolazine  (RANEXA ) 500 MG 12 hr tablet Take 1 tablet by mouth 2 (two) times daily.    [provider]  sodium fluoride  (DENTA 5000 PLUS) 1.1 % CREA dental cream See admin instructions. 01/03/22   [provider]  Tiotropium Bromide-Olodaterol (STIOLTO RESPIMAT) 2.5-2.5 MCG/ACT AERS Inhale 1 each into the lungs daily.    [provider]  torsemide  (DEMADEX ) 20 MG tablet Take 40 mg by mouth 2 (two) times daily.    [provider]      VITAL SIGNS:  Blood pressure 127/62, pulse 64, temperature 98 F (36.7 C), temperature source Oral, resp. rate 15, height 5' 6 (1.676 m), weight 83.9 kg, SpO2 97%.  PHYSICAL EXAMINATION:  Physical Exam  GENERAL:  83 y.o.-year-old patient lying in the bed with no acute distress.  EYES: Pupils equal, round, reactive to light and accommodation. No scleral icterus. Extraocular muscles intact.  HEENT: Head atraumatic, normocephalic. Oropharynx and nasopharynx clear.  NECK:  Supple, no jugular venous distention. No thyroid  enlargement, no tenderness.  LUNGS: Diminished bibasilar breath sounds with bibasilar rales.  No use of accessory muscles of respiration.  CARDIOVASCULAR: Regular rate and rhythm, S1, S2 normal.  2/6 systolic ejection murmur heard at the left lower sternal border with no gallops or rubs.  ABDOMEN: Soft, nondistended, nontender. Bowel sounds present. No organomegaly or mass.  EXTREMITIES: No pedal edema,  cyanosis, or clubbing.  NEUROLOGIC: Cranial nerves II through XII are intact. Muscle strength 5/5 in all extremities. Sensation intact. Gait not checked.  PSYCHIATRIC: The patient is alert and oriented x 3.  Normal affect and good eye contact. SKIN: No obvious rash, lesion, or ulcer.   LABORATORY PANEL:   CBC Recent Labs  Lab 07/01/24 2305  WBC 10.4  HGB 12.2*  HCT 36.1*  PLT 216   ------------------------------------------------------------------------------------------------------------------  Chemistries  Recent Labs  Lab 07/01/24 2305  NA 136  K 3.7  CL 96*  CO2 27  GLUCOSE 201*  BUN 106*  CREATININE 4.35*  CALCIUM  9.0   ------------------------------------------------------------------------------------------------------------------  Cardiac Enzymes No results for input(s): TROPONINI in the last 168 hours. ------------------------------------------------------------------------------------------------------------------  RADIOLOGY:  CT Chest Wo Contrast Result Date: 07/02/2024 EXAM: CT CHEST WITHOUT CONTRAST 07/02/2024 12:43:41 AM TECHNIQUE: CT of the chest was performed without the administration of intravenous contrast. Multiplanar reformatted images are provided for review. Automated exposure control, iterative reconstruction, and/or weight based adjustment of the mA/kV was utilized to reduce the radiation dose to as low as reasonably achievable. COMPARISON: 04/22/2023 CLINICAL HISTORY: Diffuse/interstitial lung disease. Per ed notes; Pt arrives via ems from home c/o pressure-like CP w/ tingling to left arm w/ sob that started 1 hour pta. Pt administered 324 ASA and 3 nitros prior to ems arrival w/out relief. Ems administered 1 nitroglycerin -spray en route, pt denied relief. Pt also reports dizziness all day. Pt on plavix . FINDINGS: MEDIASTINUM: Mild cardiomegaly. Thoracic aortic atherosclerosis, including the aortic root/valve. LYMPH NODES: No mediastinal, hilar or  axillary lymphadenopathy. LUNGS AND PLEURA: Mild subpleural reticulation/fibrosis, lower lobe predominant, suggesting stable mild chronic lung disease. Scattered calcified granulomata, benign. No focal consolidation or pulmonary edema. No pleural effusion or pneumothorax. SOFT TISSUES/BONES: No acute abnormality of the bones  or soft tissues. UPPER ABDOMEN: Limited images of the upper abdomen demonstrates no acute abnormality. IMPRESSION: 1. No acute cardiopulmonary abnormality. 2. Stable mild chronic lung disease. Electronically signed by: Pinkie Pebbles MD 07/02/2024 12:51 AM EDT RP Workstation: [id number]   DG Chest 1 View Result Date: 07/01/2024 CLINICAL DATA:  Pressure-like chest pain. EXAM: CHEST  1 VIEW COMPARISON:  Apr 26, 2024 FINDINGS: The heart size and mediastinal contours are within normal limits. Both lungs are clear. The visualized skeletal structures are unremarkable. IMPRESSION: No active disease. Electronically Signed   By: Suzen Dials M.D.   On: 07/01/2024 23:43      IMPRESSION AND PLAN:  Assessment and Plan: * Acute on chronic diastolic CHF (congestive heart failure) (HCC)  -The patient will be admitted to a cardiac telemetry bed. - We will continue diuresis with IV Lasix . - We Will follow serial troponins. - Will continue Jardiance. - Continue Zaroxolyn . - Will continue IV heparin  drip as well as nitroglycerin  drip. - We will follow I's and O's and daily weights. - Cardiology consult be obtained. - I notified Dr. Ammon about the patient.   Essential hypertension - Will continue antihypertensive therapy.  Chronic GERD without esophagitis - Will continue PPI therapy.  Type 2 diabetes mellitus without complication, with long-term current use of insulin  (HCC) - The patient will be placed on supplemental coverage with NovoLog . - Will continue basal coverage. - Will continue Jardiance.  Dyslipidemia - Will continue statin therapy.  Depression - Will  continue Celexa    DVT prophylaxis: Lovenox .  Advanced Care Planning:  Code Status: full code.  Family Communication:  The plan of care was discussed in details with the patient (and family). I answered all questions. The patient agreed to proceed with the above mentioned plan. Further management will depend upon hospital course. Disposition Plan: Back to previous home environment Consults called: Cardiology All the records are reviewed and case discussed with ED provider.  Status is: Inpatient  At the time of the admission, it appears that the appropriate admission status for this patient is inpatient.  This is judged to be reasonable and necessary in order to provide the required intensity of service to ensure the patient's safety given the presenting symptoms, physical exam findings and initial radiographic and laboratory data in the context of comorbid conditions.  The patient requires inpatient status due to high intensity of service, high risk of further deterioration and high frequency of surveillance required.  I certify that at the time of admission, it is my clinical judgment that the patient will require inpatient hospital care extending more than 2 midnights.                            Dispo: The patient is from: Home              Anticipated d/c is to: Home              Patient currently is not medically stable to d/c.              Difficult to place patient: No  [place]ison DELENA Peaches M.D on 07/02/2024 at 7:33 AM  [facility]   From 7 PM-7 AM, contact night-coverage [email or web address]  CC: Primary care physician; Liana Fish, NP

## 2024-07-02 NOTE — ED Notes (Signed)
 Pt reports being pain free at this time. Pt verbalizes understanding of call light system if pain comes back. Call light within reach and lights dimmed for comfort.

## 2024-07-02 NOTE — Assessment & Plan Note (Signed)
Will continue Celexa

## 2024-07-02 NOTE — TOC CM/SW Note (Signed)
..  Transition of Care [facility]) - Inpatient Brief Assessment   Patient Details  Name: Mike Decker MRN: [id number] Date of Birth: 07-04-1941  Transition of [facility]) CM/SW Contact:    Edsel DELENA Fischer, LCSW Phone Number: 07/02/2024, 7:10 PM   Clinical Narrative:  SW met with pt at bedside. Per pt report:  Pt lives with wife- Arlean and 84 year old twin boys with special needs (downs syndrome).  No safety/ DV concerns.  PCP: Dr. Mardy and [facility] and [place].  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  HH: Received PT, OT in the past but does not recall the agency, about 6 years ago when pt had stroke or heart attack.  DME: CPAP, 4 wheel walker, wears glasses.  Transportation: Wife will provide transportation at discharge.  Social Support: Wife and cousin that lives in [place]. Most family members are dead. No financial concerns or issues paying for medications.  ADL/ IADL concerns: no.  Incontinence: no. Issues with taking medications as prescribed: no.    Pt stated that if he ever needs skilled nursing not to send him to [place].  Pt stated the wants to use [facility] for his Oxygen and [facility]- Dr. Kayla for PT  Transition of Care Asessment:

## 2024-07-02 NOTE — ED Notes (Signed)
 Pt noted to be 89% on RA. Pt placed on 2L [place]. Pt remains free of CP at this time. Pt assisted w/ urinal. 700 mL output noted

## 2024-07-02 NOTE — Assessment & Plan Note (Addendum)
 Patient with intermittent pressure-like chest pain radiating to left arm, seems chronic.  Borderline troponin with a flat curve. Cardiology is on board. Discontinuing heparin  infusion as symptoms seems chronic No further plan for any workup Cardiology is also recommending to wean off from nitroglycerin  drip -Restarting home metoprolol , Imdur  and Ranexa  - Continue home Plavix  and statin

## 2024-07-02 NOTE — ED Notes (Signed)
 Pt to radiology at this time.

## 2024-07-02 NOTE — Assessment & Plan Note (Addendum)
 SSI

## 2024-07-02 NOTE — Consult Note (Signed)
 PHARMACY - ANTICOAGULATION CONSULT NOTE  Pharmacy Consult for Heparin  Indication: chest pain/ACS  Allergies  Allergen Reactions   Gabapentin      Other reaction(s): Other (See Comments) Tremors   Peanut-Containing Drug Products Anaphylaxis   Penicillins Hives and Rash    Per notes - patient tolerated cefdinir in 2022    Bee Venom Swelling   Influenza Vaccines Hives   Inh [Isoniazid ] Hives   Jardiance [Empagliflozin] Other (See Comments)    Diarrhea   Kenalog [Triamcinolone Acetonide] Hives   Levaquin  [Levofloxacin ] Other (See Comments)    Tendon, ligament pain.    Lisinopril      Other reaction(s): Hyperkalemia   Nalfon [Fenoprofen Calcium ] Hives   Naproxen    Peanut Oil    Nsaids Rash    Nalfon 600 Nalfon 600    Patient Measurements: Height: 5' 6 (167.6 cm) Weight: 83.9 kg (185 lb) IBW/kg (Calculated) : 63.8 HEPARIN  DW (KG): 81  Vital Signs: Temp: 98.4 F (36.9 C) (07/24 0101) Temp Source: Oral (07/24 0101) BP: 159/61 (07/24 0101) Pulse Rate: 65 (07/23 2259)  Labs: Recent Labs    07/01/24 2305 07/02/24 0113  HGB 12.2*  --   HCT 36.1*  --   PLT 216  --   LABPROT 14.3  --   INR 1.1  --   CREATININE 4.35*  --   TROPONINIHS 43* 51*    Estimated Creatinine Clearance: 12.8 mL/min (A) (by C-G formula based on SCr of 4.35 mg/dL (H)).   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 male with history of pulmonary fibrosis, diastolic heart failure, hypertension, diabetes, hyperlipidemia, anemia of chronic kidney disease, chronic kidney disease who presents to the emergency department with chest tightness, shortness of breath, abdominal swelling, weight gain of 5 pounds, orthopnea. Troponin levels of 43 and 51. Pharmacy has been consulted to initiate and monitor continuous heparin  infusion.   Baseline labs: aPTT pending, INR 1.1, Hgb 12.2, Plts 216   Goal of Therapy:  Heparin  level 0.3-0.7 units/ml Monitor platelets by anticoagulation protocol: Yes   Plan:  Give 4000 units bolus x 1 Start heparin  infusion at 1000 units/hr Check anti-Xa level in 8 hours and daily while on heparin  Continue to monitor H&H and platelets  Chadd Tollison A Braelee Herrle 07/02/2024,2:16 AM

## 2024-07-02 NOTE — Assessment & Plan Note (Signed)
 Will continue statin therapy

## 2024-07-02 NOTE — Consult Note (Signed)
 [facility] CLINIC CARDIOLOGY CONSULT NOTE       Patient ID: Trisha Morandi MRN: [id number] DOB/AGE: April 04, 1941 83 y.o.  Admit date: 07/01/2024 Referring Physician Dr. Lawence Primary Physician Liana Fish, NP Primary Cardiologist Dr. Florencio Reason for Consultation AoCHFpEF, elevated troponins  HPI: Arnulfo Batson is a 83 y.o. male  with a past medical history of chronic angina, microvascular disease, CAD s/p PCI w/ DES RCA, prox LCx, jailed OM 2 but good flow, HFpEF (EF 55-60% 08/2022), mild to moderate AS (mean grad 19 mmHg), CKD 4, type 2 diabetes, carotid stenosis, COPD, OSA on CPAP, Barrett's esophagus, history of prostate cancer , who presented to the ED on 07/01/2024 for chest pain that radiates to left arm, associated with nausea, dyspnea, abdominal distention.  Patient has gained 6 pounds in the past 3 days.  Cardiology was consulted for further evaluation.   Patient presented to the ED with chest pain that radiates to left arm, associated with nausea, dyspnea, abdominal distention. Work up in the ED notable for sodium 136, potassium 3.7, creatinine 4.35, hemoglobin 12.2, platelets 260.  Trops mildly elevated and flat 43 > 51.  EKG with sinus rhythm, rate 63 bpm without acute ischemic changes.  BP within normal limits at 83.  CXR and CT chest without acute cardiopulmonary disease.  Patient received 1 dose of IV Lasix  40 mg and started on heparin  infusion and nitroglycerin  infusion.   At the time of my evaluation this afternoon, patient was comfortably in ED stretcher.  We discussed patient's symptoms in further detail.  Patient states yesterday he had pressure chest pain at rest.  Patient states yesterday he felt that his chest pain felt like abdominal pain because it was near his abdomen area.  However earlier this morning patient states he had chest pain again that radiated to his left arm with mild SOB.  Patient states he has had this multiple times before and this time it  was less severe than normal.  Patient states he took aspirin  and 3X nitroglycerin  at home and that did not relieve his chest pain.  Upon arrival in the ED patient was started on nitroglycerin  infusion fusion.  Currently patient states he is chest pain-free, denies left shoulder pain, palpitations or SOB.  Patient also reports he has been having intermittent dizziness episodes where it feels like he is about to pass out for over 2 months.  Patient states he has noticed that his intermittent dizziness has been getting worse over these last 2 months.  Patient denies any LOC.  Patient states his intermittent dizziness is not associated with exertion and just comes on at random times.  Patient appears euvolemic.  Pertinent Cardiac History (Most recent) Cardiac PET Perfusion Test (01/02/2024):  1. No acute CT findings of the included chest. 2. Dense aortic valve calcifications. Correlate for echocardiographic evidence of aortic valve dysfunction. 3. Extensive three vessel coronary artery calcifications.   LHC (12/13/2022) Left ventriculogram preserved left ventricular function EF around 55 to 60% mild left ventricular enlargement  Left main large minor irregularities  LAD large with diffuse minor irregularities and a 50% mid lesion distal 50-75 Circumflex medium to large minor irregularities widely patent proximal stent OM1 diffusely diseased 50-75 small vessel RCA large proximal stent widely patent 50% mid lesion mixed dominant All vessels with TIMI-3 flow Intervention deferred not indicated  Review of systems complete and found to be negative unless listed above    Past Medical History:  Diagnosis Date   Anemia  Arthritis    Atrioventricular canal (AVC)    irregular heart beats   Barrett esophagus    Bronchiolitis    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, esphageal   Chronic diastolic CHF (congestive heart failure) (HCC)    Colon polyp    Diabetes mellitus without complication (HCC)     Diverticulosis    Gout    Heart disease    Hemangioma    liver   Hyperlipidemia    Hypertension    Myocardial infarct [facility])    Ocular hypertension    Peripheral vascular disease (HCC)    Skin cancer    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HCC)    Sleep apnea    Vitreoretinal degeneration     Past Surgical History:  Procedure Laterality Date   CATARACT EXTRACTION  2011, 2012   COLONOSCOPY N/A 12/14/2018   Procedure: COLONOSCOPY;  Surgeon: Janalyn Keene NOVAK, MD;  Location: [facility] ENDOSCOPY;  Service: Endoscopy;  Laterality: N/A;   CORONARY ANGIOPLASTY WITH STENT PLACEMENT  12/10/2010   CORONARY STENT INTERVENTION N/A 12/29/2019   Procedure: CORONARY STENT INTERVENTION;  Surgeon: Mady Bruckner, MD;  Location: [facility] INVASIVE CV LAB;  Service: Cardiovascular;  Laterality: N/A;   ESOPHAGOGASTRODUODENOSCOPY N/A 12/14/2018   Procedure: ESOPHAGOGASTRODUODENOSCOPY (EGD);  Surgeon: Janalyn Keene NOVAK, MD;  Location: [facility] ENDOSCOPY;  Service: Endoscopy;  Laterality: N/A;   ESOPHAGOGASTRODUODENOSCOPY (EGD) WITH PROPOFOL  N/A 06/01/2016   Procedure: ESOPHAGOGASTRODUODENOSCOPY (EGD) WITH PROPOFOL ;  Surgeon: Lamar ONEIDA Holmes, MD;  Location: [facility] ENDOSCOPY;  Service: Endoscopy;  Laterality: N/A;   ESOPHAGOGASTRODUODENOSCOPY (EGD) WITH PROPOFOL  N/A 01/31/2023   Procedure: ESOPHAGOGASTRODUODENOSCOPY (EGD) WITH PROPOFOL ;  Surgeon: Jinny Carmine, MD;  Location: [facility] ENDOSCOPY;  Service: Endoscopy;  Laterality: N/A;   FEMORAL ARTERY STENT Right    HERNIA REPAIR     x2   INTRAOCULAR LENS INSERTION     LEFT HEART CATH AND CORONARY ANGIOGRAPHY N/A 05/09/2017   Procedure: Left Heart Cath and Coronary Angiography;  Surgeon: Florencio Cara BIRCH, MD;  Location: [facility] INVASIVE CV LAB;  Service: Cardiovascular;  Laterality: N/A;   LEFT HEART CATH AND CORONARY ANGIOGRAPHY N/A 12/08/2018   Procedure: LEFT HEART CATH AND CORONARY ANGIOGRAPHY and possible PCI and stent;  Surgeon: Florencio Cara BIRCH, MD;  Location: [facility] INVASIVE CV LAB;   Service: Cardiovascular;  Laterality: N/A;   LEFT HEART CATH AND CORONARY ANGIOGRAPHY N/A 12/29/2019   Procedure: LEFT HEART CATH AND CORONARY ANGIOGRAPHY;  Surgeon: Mady Bruckner, MD;  Location: [facility] INVASIVE CV LAB;  Service: Cardiovascular;  Laterality: N/A;   LEFT HEART CATH AND CORONARY ANGIOGRAPHY Left 12/13/2022   Procedure: LEFT HEART CATH AND CORONARY ANGIOGRAPHY;  Surgeon: Florencio Cara BIRCH, MD;  Location: [facility] INVASIVE CV LAB;  Service: Cardiovascular;  Laterality: Left;   NOSE SURGERY     submucous resection   PROSTATE SURGERY  12/10/2000   ROTATOR CUFF REPAIR Right     (Not in a hospital admission)  Social History   Socioeconomic History   Marital status: Married    Spouse name: Not on file   Number of children: 2   Years of education: College 3 years   Highest education level: Associate degree: occupational, Scientist, product/process development, or vocational program  Occupational History   Occupation: retired  Tobacco Use   Smoking status: Former    Current packs/day: 0.00    Types: Cigarettes    Start date: 12/10/1956    Quit date: 12/10/1976    Years since quitting: 47.5   Smokeless tobacco: Never  Vaping Use   Vaping status:  Never Used  Substance and Sexual Activity   Alcohol use: No   Drug use: No   Sexual activity: Not Currently  Other Topics Concern   Not on file  Social History Narrative   Not on file   Social Drivers of Health   Financial Resource Strain: Low Risk  (06/21/2023)   Received from [facility]   Overall Financial Resource Strain (CARDIA)    Difficulty of Paying Living Expenses: Not very hard  Food Insecurity: No Food Insecurity (04/23/2024)   Hunger Vital Sign    Worried About Running Out of Food in the Last Year: Never true    Ran Out of Food in the Last Year: Never true  Transportation Needs: No Transportation Needs (04/23/2024)   PRAPARE - Administrator, Civil Service (Medical): No    Lack of Transportation (Non-Medical): No   Physical Activity: Insufficiently Active (09/15/2021)   Exercise Vital Sign    Days of Exercise per Week: 7 days    Minutes of Exercise per Session: 20 min  Stress: No Stress Concern Present (09/15/2021)   [employer] of Occupational Health - Occupational Stress Questionnaire    Feeling of Stress : Not at all  Social Connections: Moderately Isolated (04/23/2024)   Social Connection and Isolation Panel    Frequency of Communication with Friends and Family: Once a week    Frequency of Social Gatherings with Friends and Family: Once a week    Attends Religious Services: More than 4 times per year    Active Member of [employer] or Organizations: No    Attends Banker Meetings: Never    Marital Status: Married  Catering manager Violence: Not At Risk (04/23/2024)   Humiliation, Afraid, Rape, and Kick questionnaire    Fear of Current or Ex-Partner: No    Emotionally Abused: No    Physically Abused: No    Sexually Abused: No    Family History  Problem Relation Age of Onset   Arthritis Mother    Stroke Maternal Grandfather    Breast cancer Neg Hx      Vitals:   07/02/24 0800 07/02/24 1000 07/02/24 1030 07/02/24 1100  BP: (!) 168/65 (!) 146/69 (!) 144/59 (!) 136/59  Pulse: 65 69 64 67  Resp: 15 18 10 17   Temp: 98 F (36.7 C)     TempSrc: Oral     SpO2: 100% 100% 100% 95%  Weight:      Height:        PHYSICAL EXAM General: Well-appearing elderly male, well nourished, in no acute distress. HEENT: Normocephalic and atraumatic. Neck: No JVD.   Lungs: Normal respiratory effort on 2L. Clear bilaterally to auscultation. No wheezes, crackles, rhonchi.  Heart: HRRR. Normal S1 and S2 without gallops or murmurs.  Abdomen: Non-distended appearing.  Msk: Normal strength and tone for age. Extremities: Warm and well perfused. No clubbing, cyanosis, edema.  Neuro: Alert and oriented X 3. Psych: Answers questions appropriately.   Labs: Basic Metabolic Panel: Recent Labs     07/01/24 2305  NA 136  K 3.7  CL 96*  CO2 27  GLUCOSE 201*  BUN 106*  CREATININE 4.35*  CALCIUM  9.0   Liver Function Tests: No results for input(s): AST, ALT, ALKPHOS, BILITOT, PROT, ALBUMIN  in the last 72 hours. No results for input(s): LIPASE, AMYLASE in the last 72 hours. CBC: Recent Labs    07/01/24 2305  WBC 10.4  HGB 12.2*  HCT 36.1*  MCV 94.8  PLT 216   Cardiac Enzymes: Recent Labs    07/01/24 2305 07/02/24 0113  TROPONINIHS 43* 51*   BNP: Recent Labs    07/01/24 2306  BNP 83.5   D-Dimer: No results for input(s): DDIMER in the last 72 hours. Hemoglobin A1C: No results for input(s): HGBA1C in the last 72 hours. Fasting Lipid Panel: No results for input(s): CHOL, HDL, LDLCALC, TRIG, CHOLHDL, LDLDIRECT in the last 72 hours. Thyroid  Function Tests: No results for input(s): TSH, T4TOTAL, T3FREE, THYROIDAB in the last 72 hours.  Invalid input(s): FREET3 Anemia Panel: No results for input(s): VITAMINB12, FOLATE, FERRITIN, TIBC, IRON , RETICCTPCT in the last 72 hours.   Radiology: CT Chest Wo Contrast Result Date: 07/02/2024 EXAM: CT CHEST WITHOUT CONTRAST 07/02/2024 12:43:41 AM TECHNIQUE: CT of the chest was performed without the administration of intravenous contrast. Multiplanar reformatted images are provided for review. Automated exposure control, iterative reconstruction, and/or weight based adjustment of the mA/kV was utilized to reduce the radiation dose to as low as reasonably achievable. COMPARISON: 04/22/2023 CLINICAL HISTORY: Diffuse/interstitial lung disease. Per ed notes; Pt arrives via ems from home c/o pressure-like CP w/ tingling to left arm w/ sob that started 1 hour pta. Pt administered 324 ASA and 3 nitros prior to ems arrival w/out relief. Ems administered 1 nitroglycerin -spray en route, pt denied relief. Pt also reports dizziness all day. Pt on plavix . FINDINGS: MEDIASTINUM: Mild cardiomegaly.  Thoracic aortic atherosclerosis, including the aortic root/valve. LYMPH NODES: No mediastinal, hilar or axillary lymphadenopathy. LUNGS AND PLEURA: Mild subpleural reticulation/fibrosis, lower lobe predominant, suggesting stable mild chronic lung disease. Scattered calcified granulomata, benign. No focal consolidation or pulmonary edema. No pleural effusion or pneumothorax. SOFT TISSUES/BONES: No acute abnormality of the bones or soft tissues. UPPER ABDOMEN: Limited images of the upper abdomen demonstrates no acute abnormality. IMPRESSION: 1. No acute cardiopulmonary abnormality. 2. Stable mild chronic lung disease. Electronically signed by: Pinkie Pebbles MD 07/02/2024 12:51 AM EDT RP Workstation: [id number]   DG Chest 1 View Result Date: 07/01/2024 CLINICAL DATA:  Pressure-like chest pain. EXAM: CHEST  1 VIEW COMPARISON:  Apr 26, 2024 FINDINGS: The heart size and mediastinal contours are within normal limits. Both lungs are clear. The visualized skeletal structures are unremarkable. IMPRESSION: No active disease. Electronically Signed   By: Suzen Dials M.D.   On: 07/01/2024 23:43    ECHO 04/24/24  1. Left ventricular ejection fraction, by estimation, is 60 to 65%. The left ventricle has normal function. The left ventricle has no regional wall motion abnormalities. Left ventricular diastolic parameters are consistent with Grade II diastolic dysfunction (pseudonormalization).   2. Right ventricular systolic function is normal. The right ventricular size is normal.   3. Left atrial size was mildly dilated.   4. The mitral valve is normal in structure. Mild mitral valve regurgitation. No evidence of mitral stenosis.   5. The aortic valve is calcified. Aortic valve regurgitation is not visualized. Mild to moderate aortic valve stenosis. Aortic valve area, by  VTI measures 1.20 cm. Aortic valve mean gradient measures 25.3 mmHg. Aortic valve Vmax measures 3.16 m/s.   6. The inferior vena cava is  normal in size with greater than 50% respiratory variability, suggesting right atrial pressure of 3 mmHg.   TELEMETRY reviewed by me 07/02/2024: Sinus rhythm, rate 60s.  EKG reviewed by me: sinus rhythm, rate 63 bpm without acute ischemic changes  Data reviewed by me 07/02/2024: last 24h vitals tele labs imaging I/O ED provider note, admission H&P.  Principal Problem:  Acute on chronic diastolic CHF (congestive heart failure) (HCC) Active Problems:   Essential hypertension   Depression   Dyslipidemia   Type 2 diabetes mellitus without complication, with long-term current use of insulin  (HCC)   Chronic GERD without esophagitis    ASSESSMENT AND PLAN:  Eluzer Howdeshell is a 83 y.o. male  with a past medical history of CAD s/p PCI w/ DES RCA, prox LCx, jailed OM 2 but good flow, hypertension, hyperlipidemia chronic HFpEF, mild to moderate AS, CKD 4, type 2 diabetes, carotid stenosis, COPD, OSA on CPAP, Barrett's esophagus, history of prostate cancer , who presented to the ED on 07/01/2024 for chest pain that radiates to left arm, associated with nausea, dyspnea, abdominal distention.  Patient has gained 6 pounds in the past 3 days.  Cardiology was consulted for further evaluation.   # Intermittent dizziness/ presyncope -Plan to place cardiac monitor at discharge to further evaluate.  # Chronic HFpEF # Acute on CKD Patient with without SOB or LEE.   BNP within normal limits at 83.  CXR and CT chest without acute cardiopulmonary disease.  Appears euvolemic on exam. - Resume home torsemide  40 mg twice daily. - Continue home metolazone  2.5 mg once weekly.   - Continue home metoprolol  succinate 100 mg daily. - Will resume home Jardiance when renal function stabilizes. - Patient not a candidate for spironolactone, GFR < 30 - Recommend Nephrology consult for worsening renal disease.   # Chronic Angina # Microvascular disease # CAD s/p multiple stents # Hypertension # Hyperlipidemia #  Likely demand ischemia with uncontrolled BP Trops mildly elevated and flat 43 > 51 is most consistent with demand ischemia with uncontrolled BP.  EKG with sinus rhythm, rate 63 bpm without acute ischemic changes.  And is currently chest pain-free.  Echo from 04/2024 with preserved EF, no RWMA, grade 2 diastolic dysfunction. - Discontinue heparin  infusion, mildly elevated trops and flat not likely ACS. - Continue nitroglycerin  infusion.  Wean when BP allows. - Resume home Imdur  120 mg BID when off nitroglycerin  infusion.  - Continue metoprolol  as stated above. - Continue home diltiazem  30 mg 3 times daily. - Continue home Ranexa  500 mg twice daily. - Continue home Plavix  75 mg daily, atorvastatin  80 mg daily. - No plan for invasive cardiac diagnostic at this time as patient's renal function is significantly worse compared to 1 month ago. Patient has chronic angina and microvascular disease that is contributing to his anginal symptoms, will continue to manage with maximizing antianginal medications.   This patient's plan of care was discussed and created with Dr. Ammon and he is in agreement.  Signed: Dorene Comfort, PA-C  07/02/2024, 11:28 AM [facility] Cardiology

## 2024-07-02 NOTE — Assessment & Plan Note (Signed)
 Concern of AKI with CKD stage IV Creatinine above 4 with baseline seems to be around 2.8-3 -Nephrology was consulted -Monitor renal function -Avoid nephrotoxins

## 2024-07-02 NOTE — Progress Notes (Signed)
 Heart Failure Navigator Progress Note  Assessed for Heart & Vascular TOC clinic readiness.  Patient does not meet criteria due to current [facility] patient.   Navigator will sign off at this time.  Roxy Horseman, RN, BSN [facility] Heart Failure Navigator Secure Chat Only

## 2024-07-02 NOTE — Hospital Course (Addendum)
 Taken from H&P.  Mike Decker is a 83 y.o. male with medical history significant for osteoarthritis, type 2 diabetes mellitus, diverticulosis, gout, coronary artery disease, hypertension, dyslipidemia and chronic diastolic CHF, mild to moderate AS, who presented to the emergency room with acute onset of chest pain felt chest tightness with graded 8/10 in severity with associated nausea and radiation to his left arm and associated dyspnea as well as abdominal distention.  He admitted to recent weight gain of 6 pounds in 3 days.  He has been having orthopnea and feels he is volume overloaded.   On presentation mildly elevated blood pressure at 149/50 otherwise stable vital, labs with glucose of 201, BUN 106, creatinine 4.35, troponin 43>> 51, BNP 83.5. EKG with NSR, borderline prolonged PR interval. CXR with no acute cardiopulmonary disease.  Patient received IV Lasix  along with a dose of morphine  and Zofran  in ED.  He was placed on nitroglycerin  drip due to recurrent chest pain, also started on heparin  infusion.  Cardiology was consulted.  7/24: Blood pressure elevated at 168/65, UA with some glucosuria only.  Continue to have intermittent chest pain-no exertional component.  No orthopnea, he chronically sleeps on a wedge due to history of GERD and Barrett's esophagus.  No recent change.  Clinically appears euvolemic with normal BNP.  Cardiology discontinued heparin  and nitroglycerin  infusion. Likely unstable angina which also seems chronic.  7/25: Vital stable, GI was consulted due to his complaint of food and pills sticking to lower esophagus with history of Barrett esophagus.  Creatinine remained above 4 with small improvement today.  CBG elevated so adding semglee . Cardiology signed off as there is no further cardiac workup needed at this time.  Cardiac monitor was provided for concern for his complaint of intermittent presyncopal episodes. Esophageogram with concern of some narrowing,  patient will need outpatient dilatation-need to hold Plavix  for 5 days before the procedure.  Currently recommending liquid diet and medications crushed in applesauce.  7/26: Patient had overnight syncopal episode after using toilet, developed significant hypotension with systolic in 70s requiring 500 cc of bolus.  Stat CT head was negative for any acute abnormality.  Vital stable this morning, labs with improving renal function with creatinine at 3.76, CBG remained elevated, increasing Semglee  to 10 units twice daily. Plavix  was held today and GI will plan outpatient esophageal dilatation in mid to late next week.  Also having some productive cough, mild blood-tinged sputum couple of time per patient-respiratory panel was negative and hemoglobin remained stable.  7/27: Remained hemodynamically stable with improving renal function and creatinine now close to baseline.  He will keep holding torsemide  and metolazone  until seen by cardiology within a week and they can restart when appropriate.  Patient also need a close follow-up with his nephrologist to monitor his renal function.  He was also instructed to keep holding Plavix  until seen by gastroenterologist and have his outpatient esophageal dilatation is done which is likely be midweek.  Patient will continue on liquid diet until that time.  His gastroenterologist will take over his further management regarding GI issues.  Patient was having intermittent chest pain which seems chronic and likely secondary to his esophageal issues.  ACS ruled out.  Patient was also given local lidocaine  to use as needed for esophageal discomfort.  We also decreased the dose of Cardizem  to 30 mg twice daily and he will continue with his home metoprolol , both of these medications are with parameters and patient understand that he will hold if  his systolic blood pressure is less than 110 and his heart rate is less than 60.  Patient will continue on current medications  and need to have a close follow-up with his providers for further assistance.

## 2024-07-03 ENCOUNTER — Inpatient Hospital Stay

## 2024-07-03 DIAGNOSIS — I200 Unstable angina: Secondary | ICD-10-CM | POA: Diagnosis not present

## 2024-07-03 DIAGNOSIS — N179 Acute kidney failure, unspecified: Secondary | ICD-10-CM | POA: Diagnosis not present

## 2024-07-03 DIAGNOSIS — R55 Syncope and collapse: Secondary | ICD-10-CM | POA: Diagnosis not present

## 2024-07-03 DIAGNOSIS — R079 Chest pain, unspecified: Secondary | ICD-10-CM | POA: Diagnosis not present

## 2024-07-03 DIAGNOSIS — R1319 Other dysphagia: Secondary | ICD-10-CM

## 2024-07-03 DIAGNOSIS — I5033 Acute on chronic diastolic (congestive) heart failure: Secondary | ICD-10-CM | POA: Diagnosis not present

## 2024-07-03 LAB — BASIC METABOLIC PANEL WITH GFR
Anion gap: 17 — ABNORMAL HIGH (ref 5–15)
BUN: 97 mg/dL — ABNORMAL HIGH (ref 8–23)
CO2: 30 mmol/L (ref 22–32)
Calcium: 9.6 mg/dL (ref 8.9–10.3)
Chloride: 90 mmol/L — ABNORMAL LOW (ref 98–111)
Creatinine, Ser: 4.12 mg/dL — ABNORMAL HIGH (ref 0.61–1.24)
GFR, Estimated: 14 mL/min — ABNORMAL LOW (ref >60)
Glucose, Bld: 212 mg/dL — ABNORMAL HIGH (ref 70–99)
Potassium: 3.4 mmol/L — ABNORMAL LOW (ref 3.5–5.1)
Sodium: 137 mmol/L (ref 135–145)

## 2024-07-03 LAB — CBC
HCT: 36.9 % — ABNORMAL LOW (ref 39.0–52.0)
Hemoglobin: 12.2 g/dL — ABNORMAL LOW (ref 13.0–17.0)
MCH: 31.1 pg (ref 26.0–34.0)
MCHC: 33.1 g/dL (ref 30.0–36.0)
MCV: 94.1 fL (ref 80.0–100.0)
Platelets: 221 K/uL (ref 150–400)
RBC: 3.92 MIL/uL — ABNORMAL LOW (ref 4.22–5.81)
RDW: 13.1 % (ref 11.5–15.5)
WBC: 9.4 K/uL (ref 4.0–10.5)
nRBC: 0 % (ref 0.0–0.2)

## 2024-07-03 LAB — GLUCOSE, CAPILLARY
Glucose-Capillary: 147 mg/dL — ABNORMAL HIGH (ref 70–99)
Glucose-Capillary: 212 mg/dL — ABNORMAL HIGH (ref 70–99)
Glucose-Capillary: 248 mg/dL — ABNORMAL HIGH (ref 70–99)
Glucose-Capillary: 207 mg/dL — ABNORMAL HIGH (ref 70–99)
Glucose-Capillary: 262 mg/dL — ABNORMAL HIGH (ref 70–99)
Glucose-Capillary: 181 mg/dL — ABNORMAL HIGH (ref 70–99)

## 2024-07-03 MED ORDER — SODIUM CHLORIDE 0.9 % IV SOLN: INTRAVENOUS | Status: AC

## 2024-07-03 MED ORDER — INSULIN GLARGINE-YFGN 100 UNIT/ML ~~LOC~~ SOLN
10.0000 [IU] | Freq: Every day | SUBCUTANEOUS | Status: DC
  Administered 2024-07-03: 10 [IU] via SUBCUTANEOUS
  Filled 2024-07-03 (×2): qty 0.1

## 2024-07-03 MED ORDER — POTASSIUM CHLORIDE CRYS ER 20 MEQ PO TBCR
30.0000 meq | EXTENDED_RELEASE_TABLET | Freq: Once | ORAL | Status: AC
  Administered 2024-07-03: 30 meq via ORAL
  Filled 2024-07-03: qty 1

## 2024-07-03 MED ORDER — LACTATED RINGERS IV BOLUS
250.0000 mL | Freq: Once | INTRAVENOUS | Status: AC
  Administered 2024-07-03: 250 mL via INTRAVENOUS

## 2024-07-03 NOTE — Assessment & Plan Note (Addendum)
 Concern of AKI with CKD stage IV Creatinine above 4 with baseline seems to be around 2.8-3.  Creatinine started improving, today at 3.76 after holding diuretic -Nephrology was consulted -Holding diuretics -Gentle hydration -Monitor renal function -Avoid nephrotoxins

## 2024-07-03 NOTE — Progress Notes (Signed)
 [facility]  ROUNDING NOTE   Subjective:   Patient well-known to us  as we follow him for outpatient management of chronic kidney disease stage IV. He has additional past medical history of esophageal adenocarcinoma status post ablation, Barrett's esophagus, GERD, coronary disease status post stent placement, COPD, congestive heart failure, prostate cancer, hypertension, obstructive sleep apnea, CVA, diabetes mellitus type 2 who presented with chest discomfort with swallowing. We were asked to see him now for acute kidney injury.  Recent baseline creatinine appears to be 2.8 with an EGFR 21.  Creatinine upon admission 4.35 and currently down to 4.1.  Objective:  Vital signs in last 24 hours:  Temp:  [97.2 F (36.2 C)-98.6 F (37 C)] 98.6 F (37 C) (07/25 1526) Pulse Rate:  [55-70] 70 (07/25 1526) Resp:  [11-20] 17 (07/25 1526) BP: (111-165)/(46-80) 165/80 (07/25 1526) SpO2:  [93 %-99 %] 96 % (07/25 1526) FiO2 (%):  [21 %] 21 % (07/24 2317) Weight:  [82.4 kg] 82.4 kg (07/25 0535)  Weight change: -1.516 kg Filed Weights   07/01/24 2303 07/03/24 0535  Weight: 83.9 kg 82.4 kg    Intake/Output: I/O last 3 completed shifts: In: 183.9 [I.V.:183.9] Out: 1400 [Urine:1400]   Intake/Output this shift:  Total I/O In: 240 [P.O.:240] Out: -   Physical Exam: General: No acute distress  Head: Normocephalic, atraumatic. Moist oral mucosal membranes  Neck: Supple  Lungs:  Clear to auscultation, normal effort  Heart: S1S2 no rubs  Abdomen:  Soft, nontender, bowel sounds present  Extremities: Trace peripheral edema.  Neurologic: Awake, alert, following commands  Skin: No acute rash  Access: No hemodialysis access    Basic Metabolic Panel: Recent Labs  Lab 07/01/24 2305 07/03/24 0455  NA 136 137  K 3.7 3.4*  CL 96* 90*  CO2 27 30  GLUCOSE 201* 212*  BUN 106* 97*  CREATININE 4.35* 4.12*  CALCIUM  9.0 9.6    Liver Function Tests: No results for input(s): AST,  ALT, ALKPHOS, BILITOT, PROT, ALBUMIN  in the last 168 hours. No results for input(s): LIPASE, AMYLASE in the last 168 hours. No results for input(s): AMMONIA in the last 168 hours.  CBC: Recent Labs  Lab 07/01/24 2305 07/03/24 0455  WBC 10.4 9.4  HGB 12.2* 12.2*  HCT 36.1* 36.9*  MCV 94.8 94.1  PLT 216 221    Cardiac Enzymes: No results for input(s): CKTOTAL, CKMB, CKMBINDEX, TROPONINI in the last 168 hours.  BNP: Invalid input(s): POCBNP  CBG: Recent Labs  Lab 07/02/24 1726 07/02/24 2138 07/03/24 0732 07/03/24 1208 07/03/24 1603  GLUCAP 229* 289* 212* 248* 207*    Microbiology: Results for orders placed or performed during the hospital encounter of 04/23/24  Culture, blood (routine x 2)     Status: None   Collection Time: 04/23/24  4:50 AM   Specimen: BLOOD RIGHT ARM  Result Value Ref Range Status   Specimen Description BLOOD RIGHT ARM  Final   Special Requests   Final    BOTTLES DRAWN AEROBIC AND ANAEROBIC Blood Culture adequate volume   Culture   Final    NO GROWTH 5 DAYS Performed at [facility], [address], [place], [place] [address]    Report Status 04/28/2024 FINAL  Final  Culture, blood (routine x 2)     Status: Abnormal   Collection Time: 04/23/24  4:50 AM   Specimen: BLOOD LEFT ARM  Result Value Ref Range Status   Specimen Description   Final    BLOOD LEFT  ARM Performed at [facility], [address] [place], [place] [address]    Special Requests   Final    BOTTLES DRAWN AEROBIC AND ANAEROBIC Blood Culture adequate volume Performed at [facility], [address] [place], [place] [address]    Culture  Setup Time   Final    GRAM POSITIVE COCCI AEROBIC BOTTLE ONLY Organism ID to follow CRITICAL RESULT CALLED TO, READ BACK BY AND VERIFIED WITH: ALEX CHAPPELL @1033  04/24/24 MJU Performed at [facility], [address] [place], [place] [address]    Culture (A)  Final     STAPHYLOCOCCUS EPIDERMIDIS THE SIGNIFICANCE OF ISOLATING THIS ORGANISM FROM A SINGLE SET OF BLOOD CULTURES WHEN MULTIPLE SETS ARE DRAWN IS UNCERTAIN. PLEASE NOTIFY THE MICROBIOLOGY DEPARTMENT WITHIN ONE WEEK IF SPECIATION AND SENSITIVITIES ARE REQUIRED. Performed at [facility], [address], [place], [place] [address]    Report Status 04/25/2024 FINAL  Final  Resp panel by RT-PCR (RSV, Flu A&B, Covid) Urine, Clean Catch     Status: None   Collection Time: 04/23/24  4:50 AM   Specimen: Urine, Clean Catch; Nasal Swab  Result Value Ref Range Status   SARS Coronavirus 2 by RT PCR NEGATIVE NEGATIVE Final    Comment: (NOTE) SARS-CoV-2 target nucleic acids are NOT DETECTED.  The SARS-CoV-2 RNA is generally detectable in upper respiratory specimens during the acute phase of infection. The lowest concentration of SARS-CoV-2 viral copies this assay can detect is 138 copies/mL. A negative result does not preclude SARS-Cov-2 infection and should not be used as the sole basis for treatment or other patient management decisions. A negative result may occur with  improper specimen collection/handling, submission of specimen other than nasopharyngeal swab, presence of viral mutation(s) within the areas targeted by this assay, and inadequate number of viral copies(<138 copies/mL). A negative result must be combined with clinical observations, patient history, and epidemiological information. The expected result is Negative.  Fact Sheet for Patients:  BloggerCourse.com  Fact Sheet for Healthcare Providers:  SeriousBroker.it  This test is no t yet approved or cleared by the [place]  FDA and  has been authorized for detection and/or diagnosis of SARS-CoV-2 by FDA under an Emergency Use Authorization (EUA). This EUA will remain  in effect (meaning this test can be used) for the duration of the COVID-19 declaration under Section 564(b)(1)  of the Act, 21 U.S.C.section 360bbb-3(b)(1), unless the authorization is terminated  or revoked sooner.       Influenza A by PCR NEGATIVE NEGATIVE Final   Influenza B by PCR NEGATIVE NEGATIVE Final    Comment: (NOTE) The Xpert Xpress SARS-CoV-2/FLU/RSV plus assay is intended as an aid in the diagnosis of influenza from Nasopharyngeal swab specimens and should not be used as a sole basis for treatment. Nasal washings and aspirates are unacceptable for Xpert Xpress SARS-CoV-2/FLU/RSV testing.  Fact Sheet for Patients: BloggerCourse.com  Fact Sheet for Healthcare Providers: SeriousBroker.it  This test is not yet approved or cleared by the [place]  FDA and has been authorized for detection and/or diagnosis of SARS-CoV-2 by FDA under an Emergency Use Authorization (EUA). This EUA will remain in effect (meaning this test can be used) for the duration of the COVID-19 declaration under Section 564(b)(1) of the Act, 21 U.S.C. section 360bbb-3(b)(1), unless the authorization is terminated or revoked.     Resp Syncytial Virus by PCR NEGATIVE NEGATIVE Final    Comment: (NOTE) Fact Sheet for Patients: BloggerCourse.com  Fact Sheet for  Healthcare Providers: SeriousBroker.it  This test is not yet approved or cleared by the [place]  FDA and has been authorized for detection and/or diagnosis of SARS-CoV-2 by FDA under an Emergency Use Authorization (EUA). This EUA will remain in effect (meaning this test can be used) for the duration of the COVID-19 declaration under Section 564(b)(1) of the Act, 21 U.S.C. section 360bbb-3(b)(1), unless the authorization is terminated or revoked.  Performed at [facility], [address] [place], [place] [address]   Blood Culture ID Panel (Reflexed)     Status: Abnormal   Collection Time: 04/23/24  4:50 AM  Result Value Ref Range  Status   Enterococcus faecalis NOT DETECTED NOT DETECTED Final   Enterococcus Faecium NOT DETECTED NOT DETECTED Final   Listeria monocytogenes NOT DETECTED NOT DETECTED Final   Staphylococcus species DETECTED (A) NOT DETECTED Final    Comment: CRITICAL RESULT CALLED TO, READ BACK BY AND VERIFIED WITH: ALEX CHAPPELL @1033  04/24/24 MJU    Staphylococcus aureus (BCID) NOT DETECTED NOT DETECTED Final   Staphylococcus epidermidis DETECTED (A) NOT DETECTED Final    Comment: Methicillin (oxacillin) resistant coagulase negative staphylococcus. Possible blood culture contaminant (unless isolated from more than one blood culture draw or clinical case suggests pathogenicity). No antibiotic treatment is indicated for blood  culture contaminants. CRITICAL RESULT CALLED TO, READ BACK BY AND VERIFIED WITH: ALEX CHAPPELL @1033  04/24/24 MJU    Staphylococcus lugdunensis NOT DETECTED NOT DETECTED Final   Streptococcus species NOT DETECTED NOT DETECTED Final   Streptococcus agalactiae NOT DETECTED NOT DETECTED Final   Streptococcus pneumoniae NOT DETECTED NOT DETECTED Final   Streptococcus pyogenes NOT DETECTED NOT DETECTED Final   A.calcoaceticus-baumannii NOT DETECTED NOT DETECTED Final   Bacteroides fragilis NOT DETECTED NOT DETECTED Final   Enterobacterales NOT DETECTED NOT DETECTED Final   Enterobacter cloacae complex NOT DETECTED NOT DETECTED Final   Escherichia coli NOT DETECTED NOT DETECTED Final   Klebsiella aerogenes NOT DETECTED NOT DETECTED Final   Klebsiella oxytoca NOT DETECTED NOT DETECTED Final   Klebsiella pneumoniae NOT DETECTED NOT DETECTED Final   Proteus species NOT DETECTED NOT DETECTED Final   Salmonella species NOT DETECTED NOT DETECTED Final   Serratia marcescens NOT DETECTED NOT DETECTED Final   Haemophilus influenzae NOT DETECTED NOT DETECTED Final   Neisseria meningitidis NOT DETECTED NOT DETECTED Final   Pseudomonas aeruginosa NOT DETECTED NOT DETECTED Final    Stenotrophomonas maltophilia NOT DETECTED NOT DETECTED Final   Candida albicans NOT DETECTED NOT DETECTED Final   Candida auris NOT DETECTED NOT DETECTED Final   Candida glabrata NOT DETECTED NOT DETECTED Final   Candida krusei NOT DETECTED NOT DETECTED Final   Candida parapsilosis NOT DETECTED NOT DETECTED Final   Candida tropicalis NOT DETECTED NOT DETECTED Final   Cryptococcus neoformans/gattii NOT DETECTED NOT DETECTED Final   Methicillin resistance mecA/C DETECTED (A) NOT DETECTED Final    Comment: CRITICAL RESULT CALLED TO, READ BACK BY AND VERIFIED WITH: ALEX CHAPPELL @1033  04/24/24 MJU Performed at [facility], [address] [place], [place] [address]     Coagulation Studies: Recent Labs    07/01/24 2305  LABPROT 14.3  INR 1.1    Urinalysis: Recent Labs    07/02/24 0113  COLORURINE STRAW*  LABSPEC 1.009  PHURINE 6.0  GLUCOSEU 150*  HGBUR NEGATIVE  BILIRUBINUR NEGATIVE  KETONESUR NEGATIVE  PROTEINUR NEGATIVE  NITRITE NEGATIVE  LEUKOCYTESUR NEGATIVE      Imaging: DG ESOPHAGUS W SINGLE CM (SOL OR THIN BA)  Result Date: 07/03/2024 CLINICAL DATA:  Provided history: Dysphagia. Additional history: The patient reports substernal chest pain when swallowing pills. History of esophageal dilation procedure. EXAM: ESOPHAGUS/BARIUM SWALLOW/TABLET STUDY TECHNIQUE: A single contrast examination was performed using thin liquid barium. Additionally, the patient swallowed a 13 mm barium tablet under fluoroscopy. The exam was performed by Carlin Griffon, PA-C, and was supervised and interpreted by Dr. Rockey Childs FLUOROSCOPY: Radiation Exposure Index (as provided by the fluoroscopic device): 53.40 mGy Kerma COMPARISON:  Upper GI series 07/23/2023. Chest CT 07/02/2024. FINDINGS: Limited examination due to the patient's inability to stand and limited ability to reposition on the fluoroscopy table. Redemonstrated small Zenker diverticulum. Also similar to the prior upper GI  series of 07/23/2023, there is smooth focal narrowing at the level of distal esophagus. A swallowed 13 mm barium tablet did not pass beyond this level despite a prolonged period of observation, and despite the patient taking additional swallows of water and barium contrast. Esophagus normal in caliber and smooth in contour elsewhere. Prominent intermittent esophageal dysmotility with tertiary contractions. Small to moderate-sized hiatal hernia, unchanged. No gastroesophageal reflux observed. IMPRESSION: 1. Limited examination as described. 2. Similar to the prior upper GI series of 07/23/2023, there is smooth focal narrowing at the level of distal esophagus. A swallowed 13 mm barium tablet did not pass beyond this level despite a prolonged period of observation, and despite the patient taking additional swallows of water and barium contrast. 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 3. Prominent esophageal dysmotility with tertiary contractions. 4. Known small Zenker diverticulum. 5. Small to moderate-sized hiatal hernia, unchanged. Electronically Signed   By: Rockey Childs D.O.   On: 07/03/2024 16:21   CT Chest Wo Contrast Result Date: 07/02/2024 EXAM: CT CHEST WITHOUT CONTRAST 07/02/2024 12:43:41 AM TECHNIQUE: CT of the chest was performed without the administration of intravenous contrast. Multiplanar reformatted images are provided for review. Automated exposure control, iterative reconstruction, and/or weight based adjustment of the mA/kV was utilized to reduce the radiation dose to as low as reasonably achievable. COMPARISON: 04/22/2023 CLINICAL HISTORY: Diffuse/interstitial lung disease. Per ed notes; Pt arrives via ems from home c/o pressure-like CP w/ tingling to left arm w/ sob that started 1 hour pta. Pt administered 324 ASA and 3 nitros prior to ems arrival w/out relief. Ems administered 1 nitroglycerin -spray en route, pt denied relief. Pt also reports dizziness all day. Pt on plavix .  FINDINGS: MEDIASTINUM: Mild cardiomegaly. Thoracic aortic atherosclerosis, including the aortic root/valve. LYMPH NODES: No mediastinal, hilar or axillary lymphadenopathy. LUNGS AND PLEURA: Mild subpleural reticulation/fibrosis, lower lobe predominant, suggesting stable mild chronic lung disease. Scattered calcified granulomata, benign. No focal consolidation or pulmonary edema. No pleural effusion or pneumothorax. SOFT TISSUES/BONES: No acute abnormality of the bones or soft tissues. UPPER ABDOMEN: Limited images of the upper abdomen demonstrates no acute abnormality. IMPRESSION: 1. No acute cardiopulmonary abnormality. 2. Stable mild chronic lung disease. Electronically signed by: Pinkie Pebbles MD 07/02/2024 12:51 AM EDT RP Workstation: [id number]   DG Chest 1 View Result Date: 07/01/2024 CLINICAL DATA:  Pressure-like chest pain. EXAM: CHEST  1 VIEW COMPARISON:  Apr 26, 2024 FINDINGS: The heart size and mediastinal contours are within normal limits. Both lungs are clear. The visualized skeletal structures are unremarkable. IMPRESSION: No active disease. Electronically Signed   By: Suzen Dials M.D.   On: 07/01/2024 23:43     Medications:    sodium chloride  40 mL/hr at 07/03/24 1224    atorvastatin   80 mg Oral  Daily   calcitRIOL   0.25 mcg Oral Daily   citalopram   10 mg Oral Daily   cyanocobalamin   1,000 mcg Oral Daily   diltiazem   30 mg Oral TID   ferrous sulfate   325 mg Oral Daily   heparin  injection (subcutaneous)  5,000 Units Subcutaneous Q8H   hydrALAZINE   25 mg Oral BID   insulin  aspart  0-5 Units Subcutaneous QHS   insulin  aspart  0-9 Units Subcutaneous TID WC   insulin  glargine-yfgn  10 Units Subcutaneous Daily   isosorbide  mononitrate  120 mg Oral BID   methocarbamol   500 mg Oral BID   metoprolol  succinate  100 mg Oral Daily   montelukast   10 mg Oral QHS   pantoprazole   40 mg Oral BID AC   ranolazine   500 mg Oral BID   acetaminophen  **OR** acetaminophen , alum & mag  hydroxide-simeth, colchicine , EPINEPHrine , fluticasone , magnesium  hydroxide, nitroGLYCERIN , ondansetron  **OR** ondansetron  (ZOFRAN ) IV, traZODone   Assessment/ Plan:  83 y.o. male with past medical history of hypertension, diabetes mellitus type 2, diverticulosis, diabetic neuropathy, peripheral vascular disease, CVA, coronary disease status post and placement, diastolic heart failure, COPD, esophageal adenocarcinoma who presented with chest pain and discomfort with swallowing.  1.  Acute kidney injury/chronic kidney disease stage IV baseline creatinine 2.8 with EGFR 21 04/28/2024.  Suspect acute kidney injury now related to dysphagia and poor p.o. intake.  He was also on high-dose diuretics.  These have been held.  Continue IV fluid hydration with 0.9 normal saline at 40 cc/h.  Check renal ultrasound to exclude obstruction.  2.  Anemia chronic kidney disease.  Hemoglobin 12.2.  No indication for Procrit.  3.  Secondary hyperparathyroidism.  Maintain the patient on Calcitrol 0.25 mcg p.o. daily and continue to monitor bone metabolism parameters periodically.     LOS: 1 Apolinar Bero 7/25/20256:02 PM

## 2024-07-03 NOTE — Evaluation (Signed)
 Physical Therapy Evaluation Patient Details Name: Mike Decker MRN: [id number] DOB: 09/03/1941 Today's Date: 07/03/2024  History of Present Illness  Pt is a 83 y.o. male with medical history significant for osteoarthritis, type 2 diabetes mellitus, diverticulosis, gout, coronary artery disease, hypertension, dyslipidemia and chronic diastolic CHF, mild to moderate AS, who presented to the emergency room with acute onset of chest pain felt chest tightness with graded 8/10 in severity with associated nausea and radiation to his left arm and associated dyspnea as well as abdominal distention.   Clinical Impression  Pt seated at EOB upon arrival to the room and pt agreeable to therapy.  Pt noted that he is still having significant pain in the esophageal area and has also been having some issues with balance and feeling dizzy.  Pt reports not utilizing walker at home during most circumstances, but he has one if needed.  Pt ambulated well with the RW, however when taken away, pt much more unsteady and veers to the R.  Pt notes he is dizzy and feels like the room is spinning, which might warrant pt to be evaluated for vestibular component in future.  Will continue to monitor symptom response to more mobility and adjust as needed.  Pt returned to sitting EOB and left with all needs met.          If plan is discharge home, recommend the following: A little help with walking and/or transfers;Assist for transportation   Can travel by private vehicle        Equipment Recommendations None recommended by PT  Recommendations for Other Services       Functional Status Assessment Patient has had a recent decline in their functional status and demonstrates the ability to make significant improvements in function in a reasonable and predictable amount of time.     Precautions / Restrictions        Mobility  Bed Mobility               General bed mobility comments: pt upright at EOB upon  arrival to the room.    Transfers Overall transfer level: Needs assistance Equipment used: Rolling walker (2 wheels)               General transfer comment: pt with good strength performing with RW    Ambulation/Gait Ambulation/Gait assistance: Contact guard assist, Supervision Gait Distance (Feet): 200 Feet Assistive device: Rolling walker (2 wheels), None Gait Pattern/deviations: WFL(Within Functional Limits) Gait velocity: decreased     General Gait Details: Pt able to ambulate well with RW, however when that was taken away, pt more unsteady with gait and veered to the R.  Stairs            Wheelchair Mobility     Tilt Bed    Modified Rankin (Stroke Patients Only)       Balance Overall balance assessment: Needs assistance Sitting-balance support: No upper extremity supported, Feet unsupported Sitting balance-Leahy Scale: Good     Standing balance support: Bilateral upper extremity supported, No upper extremity supported, During functional activity Standing balance-Leahy Scale: Good                               Pertinent Vitals/Pain Pain Assessment Pain Assessment: Faces Faces Pain Scale: Hurts little more Pain Location: esophageal pain Pain Descriptors / Indicators: Burning Pain Intervention(s): Monitored during session    Home Living Family/patient expects to be discharged to:: Private  residence Living Arrangements: Spouse/significant other;Children Available Help at Discharge: Family Type of Home: House Home Access: Ramped entrance       Home Layout: One level Home Equipment: Cane - single point;Shower seat - Estate manager/land agent (4 wheels)      Prior Function Prior Level of Function : Independent/Modified Independent             Mobility Comments: Uses SPC in the community, no AD at home, no falls hx ADLs Comments: IND other than driving     Extremity/Trunk Assessment   Upper Extremity Assessment Upper Extremity  Assessment: Generalized weakness    Lower Extremity Assessment Lower Extremity Assessment: Generalized weakness       Communication        Cognition                                         Cueing       General Comments      Exercises     Assessment/Plan    PT Assessment Patient needs continued PT services  PT Problem List Decreased strength;Decreased activity tolerance;Decreased mobility;Decreased safety awareness       PT Treatment Interventions DME instruction;Gait training;Functional mobility training;Therapeutic activities;Therapeutic exercise;Balance training;Neuromuscular re-education    PT Goals (Current goals can be found in the Care Plan section)  Acute Rehab PT Goals Patient Stated Goal: to reduce esophageal pain. PT Goal Formulation: With patient Time For Goal Achievement: 07/17/24 Potential to Achieve Goals: Fair    Frequency Min 2X/week     Co-evaluation               AM-PAC PT 6 Clicks Mobility  Outcome Measure Help needed turning from your back to your side while in a flat bed without using bedrails?: A Little Help needed moving from lying on your back to sitting on the side of a flat bed without using bedrails?: A Little Help needed moving to and from a bed to a chair (including a wheelchair)?: A Little Help needed standing up from a chair using your arms (e.g., wheelchair or bedside chair)?: A Little Help needed to walk in hospital room?: A Little Help needed climbing 3-5 steps with a railing? : A Little 6 Click Score: 18    End of Session Equipment Utilized During Treatment: Gait belt Activity Tolerance: Patient tolerated treatment well Patient left: in bed Nurse Communication: Mobility status PT Visit Diagnosis: Unsteadiness on feet (R26.81);Other abnormalities of gait and mobility (R26.89);Muscle weakness (generalized) (M62.81);Difficulty in walking, not elsewhere classified (R26.2)    Time: 8981-8965 PT Time  Calculation (min) (ACUTE ONLY): 16 min   Charges:       PT General Charges $$ ACUTE PT VISIT: 1 Visit         Fonda Simpers, PT, DPT Physical Therapist - [place]  [facility]  07/03/24, 10:59 AM

## 2024-07-03 NOTE — Progress Notes (Signed)
 [facility] CLINIC CARDIOLOGY PROGRESS NOTE       Patient ID: Mike Decker MRN: [id number] DOB/AGE: 84-Apr-1942 84 y.o.  Admit date: 07/01/2024 Referring Physician Dr. Lawence Primary Physician Liana Fish, NP Primary Cardiologist Dr. Florencio Reason for Consultation AoCHFpEF, elevated troponins  HPI: Mike Decker is a 83 y.o. male  with a past medical history of chronic angina, microvascular disease, CAD s/p PCI w/ DES RCA, prox LCx, jailed OM 2 but good flow, HFpEF (EF 55-60% 08/2022), mild to moderate AS (mean grad 19 mmHg), CKD 4, type 2 diabetes, carotid stenosis, COPD, OSA on CPAP, Barrett's esophagus, history of prostate cancer , who presented to the ED on 07/01/2024 for chest pain that radiates to left arm, associated with nausea, dyspnea, abdominal distention.  Patient has gained 6 pounds in the past 3 days.  Cardiology was consulted for further evaluation.   Interval History: -Patient seen and examined this AM and laying comfortably in hospital bed. Patient states he feels better today and denies any more chest pain. Denies SOB or dizziness.  -Patients BP improved and HR  stable this AM. Overnight Tele showed no significant events.  -Patient remains on room air with stable SpO2.    Pertinent Cardiac History (Most recent) Cardiac PET Perfusion Test (01/02/2024):  1. No acute CT findings of the included chest. 2. Dense aortic valve calcifications. Correlate for echocardiographic evidence of aortic valve dysfunction. 3. Extensive three vessel coronary artery calcifications.   LHC (12/13/2022) Left ventriculogram preserved left ventricular function EF around 55 to 60% mild left ventricular enlargement  Left main large minor irregularities  LAD large with diffuse minor irregularities and a 50% mid lesion distal 50-75 Circumflex medium to large minor irregularities widely patent proximal stent OM1 diffusely diseased 50-75 small vessel RCA large proximal stent widely  patent 50% mid lesion mixed dominant All vessels with TIMI-3 flow Intervention deferred not indicated  Review of systems complete and found to be negative unless listed above    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, esphageal   Chronic diastolic CHF (congestive heart failure) (HCC)    Colon polyp    Diabetes mellitus without complication (HCC)    Diverticulosis    Gout    Heart disease    Hemangioma    liver   Hyperlipidemia    Hypertension    Myocardial infarct [facility])    Ocular hypertension    Peripheral vascular disease (HCC)    Skin cancer    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HCC)    Sleep apnea    Vitreoretinal degeneration     Past Surgical History:  Procedure Laterality Date   CATARACT EXTRACTION  2011, 2012   COLONOSCOPY N/A 12/14/2018   Procedure: COLONOSCOPY;  Surgeon: Janalyn Keene NOVAK, MD;  Location: [facility] ENDOSCOPY;  Service: Endoscopy;  Laterality: N/A;   CORONARY ANGIOPLASTY WITH STENT PLACEMENT  12/10/2010   CORONARY STENT INTERVENTION N/A 12/29/2019   Procedure: CORONARY STENT INTERVENTION;  Surgeon: Mady Bruckner, MD;  Location: [facility] INVASIVE CV LAB;  Service: Cardiovascular;  Laterality: N/A;   ESOPHAGOGASTRODUODENOSCOPY N/A 12/14/2018   Procedure: ESOPHAGOGASTRODUODENOSCOPY (EGD);  Surgeon: Janalyn Keene NOVAK, MD;  Location: [facility] ENDOSCOPY;  Service: Endoscopy;  Laterality: N/A;   ESOPHAGOGASTRODUODENOSCOPY (EGD) WITH PROPOFOL  N/A 06/01/2016   Procedure: ESOPHAGOGASTRODUODENOSCOPY (EGD) WITH PROPOFOL ;  Surgeon: Lamar DASEN  Viktoria, MD;  Location: [facility] ENDOSCOPY;  Service: Endoscopy;  Laterality: N/A;   ESOPHAGOGASTRODUODENOSCOPY (EGD) WITH PROPOFOL  N/A 01/31/2023   Procedure: ESOPHAGOGASTRODUODENOSCOPY (EGD) WITH PROPOFOL ;  Surgeon: Jinny Carmine, MD;  Location: [facility] ENDOSCOPY;  Service: Endoscopy;  Laterality: N/A;    FEMORAL ARTERY STENT Right    HERNIA REPAIR     x2   INTRAOCULAR LENS INSERTION     LEFT HEART CATH AND CORONARY ANGIOGRAPHY N/A 05/09/2017   Procedure: Left Heart Cath and Coronary Angiography;  Surgeon: Florencio Cara BIRCH, MD;  Location: [facility] INVASIVE CV LAB;  Service: Cardiovascular;  Laterality: N/A;   LEFT HEART CATH AND CORONARY ANGIOGRAPHY N/A 12/08/2018   Procedure: LEFT HEART CATH AND CORONARY ANGIOGRAPHY and possible PCI and stent;  Surgeon: Florencio Cara BIRCH, MD;  Location: [facility] INVASIVE CV LAB;  Service: Cardiovascular;  Laterality: N/A;   LEFT HEART CATH AND CORONARY ANGIOGRAPHY N/A 12/29/2019   Procedure: LEFT HEART CATH AND CORONARY ANGIOGRAPHY;  Surgeon: Mady Bruckner, MD;  Location: [facility] INVASIVE CV LAB;  Service: Cardiovascular;  Laterality: N/A;   LEFT HEART CATH AND CORONARY ANGIOGRAPHY Left 12/13/2022   Procedure: LEFT HEART CATH AND CORONARY ANGIOGRAPHY;  Surgeon: Florencio Cara BIRCH, MD;  Location: [facility] INVASIVE CV LAB;  Service: Cardiovascular;  Laterality: Left;   NOSE SURGERY     submucous resection   PROSTATE SURGERY  12/10/2000   ROTATOR CUFF REPAIR Right     Medications Prior to Admission  Medication Sig Dispense Refill Last Dose/Taking   acetaminophen  (TYLENOL ) 500 MG tablet Take 1,000 mg by mouth every 8 (eight) hours as needed for mild pain.   07/01/2024   atorvastatin  (LIPITOR ) 80 MG tablet Take 1 tablet (80 mg total) by mouth daily. 30 tablet 0 07/01/2024 Morning   azelastine  (ASTELIN ) 0.1 % nasal spray USE 1 TO 2 SPRAYS IN EACH NOSTRIL TWICE A DAY   07/01/2024 Evening   calcitRIOL  (ROCALTROL ) 0.25 MCG capsule Take 0.25 mcg by mouth daily.   07/01/2024 Evening   citalopram  (CELEXA ) 10 MG tablet Take 1 tablet (10 mg total) by mouth daily. 90 tablet 3 07/01/2024 Morning   clopidogrel  (PLAVIX ) 75 MG tablet TAKE 1 TABLET BY MOUTH EVERY DAY 90 tablet 1 07/01/2024 at  7:15 AM   [employer] VITAMIN B12 1000 MCG tablet TAKE 1 TABLET BY MOUTH EVERY DAY 30 tablet 0 07/01/2024  Morning   diltiazem  (CARDIZEM ) 30 MG tablet Take 1 tablet (30 mg total) by mouth 3 (three) times daily. Do not take this medication if SBP less than 110 and/or HR less than 60 90 tablet 0 07/01/2024 at  4:00 PM   docusate sodium  (COLACE) 100 MG capsule Take 100 mg by mouth 2 (two) times daily. (Patient taking differently: Take 200 mg by mouth daily.)   07/01/2024 Morning   EPINEPHrine  (EPI-PEN) 0.3 mg/0.3 mL SOAJ injection Inject into the muscle as needed (anaphylaxis).   Unknown   ergocalciferol  (VITAMIN D2) 1.25 MG (50000 UT) capsule Take 50,000 Units by mouth every 30 (thirty) days.   07/01/2024 Morning   ferrous sulfate  325 (65 FE) MG EC tablet TAKE 1 TABLET BY MOUTH EVERY DAY 90 tablet 3 07/01/2024 Morning   fluticasone  (FLONASE ) 50 MCG/ACT nasal spray USE 1 SPRAY IN EACH NOSTRIL EVERY DAY 32 g 6 07/01/2024   hydrALAZINE  (APRESOLINE ) 50 MG tablet Take 25 mg by mouth 2 (two) times daily.   07/01/2024 Evening   insulin  aspart (NOVOLOG ) 100 UNIT/ML FlexPen Inject 10-30 Units into the skin 3 (three) times daily with meals.  Take 10 units before breakfast, 30 units before lunch and 30 units before supper.   07/01/2024 at  6:00 PM   insulin  glargine (LANTUS ) 100 UNIT/ML injection Inject 0.35 mLs (35 Units total) into the skin at bedtime. This is a decrease from your previous 45 units nightly. (Patient taking differently: Inject 60 Units into the skin at bedtime.) 10 mL 11 06/30/2024   isosorbide  mononitrate (IMDUR ) 120 MG 24 hr tablet Take 1 tablet (120 mg total) by mouth 2 (two) times daily. 60 tablet 0 07/01/2024 Morning   JARDIANCE 10 MG TABS tablet Take 10 mg by mouth daily.   07/01/2024 Morning   loratadine  (CLARITIN ) 10 MG tablet Take 10 mg by mouth daily.   07/01/2024 Morning   magnesium  oxide (MAG-OX) 400 (240 Mg) MG tablet Take 1 tablet by mouth daily.   06/30/2024   methocarbamol  (ROBAXIN ) 500 MG tablet Take 500 mg by mouth 2 (two) times daily.   07/01/2024 Evening   metolazone  (ZAROXOLYN ) 2.5 MG tablet  Take 2.5 mg by mouth once a week.   06/27/2024   metoprolol  succinate (TOPROL -XL) 100 MG 24 hr tablet Take 1 tablet (100 mg total) by mouth daily. Take with or immediately following a meal. 30 tablet 0 07/01/2024 Morning   mometasone  (ASMANEX ) 220 MCG/ACT inhaler Inhale 2 puffs into the lungs daily.   07/01/2024 Morning   montelukast  (SINGULAIR ) 10 MG tablet TAKE 1 TABLET AT BEDTIME 90 tablet 3 06/30/2024   nitroGLYCERIN  (NITROSTAT ) 0.4 MG SL tablet Place 0.4 mg under the tongue every 5 (five) minutes x 3 doses as needed for chest pain.   07/01/2024 Evening   pantoprazole  (PROTONIX ) 40 MG tablet Take 1 tablet (40 mg total) by mouth 2 (two) times daily before a meal.   07/01/2024 Evening   ranolazine  (RANEXA ) 500 MG 12 hr tablet Take 1 tablet by mouth 2 (two) times daily.   07/01/2024 Evening   Tiotropium Bromide-Olodaterol (STIOLTO RESPIMAT) 2.5-2.5 MCG/ACT AERS Inhale 1 each into the lungs daily.   07/01/2024 Morning   torsemide  (DEMADEX ) 20 MG tablet Take 40 mg by mouth 2 (two) times daily.   07/01/2024 Evening   Social History   Socioeconomic History   Marital status: Married    Spouse name: Not on file   Number of children: 2   Years of education: College 3 years   Highest education level: Associate degree: occupational, Scientist, product/process development, or vocational program  Occupational History   Occupation: retired  Tobacco Use   Smoking status: Former    Current packs/day: 0.00    Types: Cigarettes    Start date: 12/10/1956    Quit date: 12/10/1976    Years since quitting: 47.5   Smokeless tobacco: Never  Vaping Use   Vaping status: Never Used  Substance and Sexual Activity   Alcohol use: No   Drug use: No   Sexual activity: Not Currently  Other Topics Concern   Not on file  Social History Narrative   Not on file   Social Drivers of Health   Financial Resource Strain: Low Risk  (06/21/2023)   [facility] from [facility]   Overall Financial Resource Strain (CARDIA)    Difficulty of  Paying Living Expenses: Not very hard  Food Insecurity: No Food Insecurity (07/03/2024)   Hunger Vital Sign    Worried About Running Out of Food in the Last Year: Never true    Ran Out of Food in the Last Year: Never true  Transportation Needs: No Transportation Needs (  07/03/2024)   PRAPARE - Administrator, Civil Service (Medical): No    Lack of Transportation (Non-Medical): No  Physical Activity: Insufficiently Active (09/15/2021)   Exercise Vital Sign    Days of Exercise per Week: 7 days    Minutes of Exercise per Session: 20 min  Stress: No Stress Concern Present (09/15/2021)   [employer] of Occupational Health - Occupational Stress Questionnaire    Feeling of Stress : Not at all  Social Connections: Moderately Isolated (07/03/2024)   Social Connection and Isolation Panel    Frequency of Communication with Friends and Family: Once a week    Frequency of Social Gatherings with Friends and Family: More than three times a week    Attends Religious Services: Never    Database administrator or Organizations: No    Attends Banker Meetings: Never    Marital Status: Married  Catering manager Violence: Not At Risk (07/03/2024)   Humiliation, Afraid, Rape, and Kick questionnaire    Fear of Current or Ex-Partner: No    Emotionally Abused: No    Physically Abused: No    Sexually Abused: No    Family History  Problem Relation Age of Onset   Arthritis Mother    Stroke Maternal Grandfather    Breast cancer Neg Hx      Vitals:   07/03/24 0729 07/03/24 0733 07/03/24 0801 07/03/24 0802  BP:  (!) 115/54    Pulse:  (!) 59    Resp: 11 18 11 14   Temp:  97.6 F (36.4 C)    TempSrc:      SpO2:  95%    Weight:      Height:        PHYSICAL EXAM General: Well-appearing elderly male, well nourished, in no acute distress. HEENT: Normocephalic and atraumatic. Neck: No JVD.   Lungs: Normal respiratory effort on room air. Clear bilaterally to auscultation. No  wheezes, crackles, rhonchi.  Heart: HRRR. Normal S1 and S2 without gallops or murmurs.  Abdomen: Non-distended appearing.  Msk: Normal strength and tone for age. Extremities: Warm and well perfused. No clubbing, cyanosis, edema.  Neuro: Alert and oriented X 3. Psych: Answers questions appropriately.   Labs: Basic Metabolic Panel: Recent Labs    07/01/24 2305 07/03/24 0455  NA 136 137  K 3.7 3.4*  CL 96* 90*  CO2 27 30  GLUCOSE 201* 212*  BUN 106* 97*  CREATININE 4.35* 4.12*  CALCIUM  9.0 9.6   Liver Function Tests: No results for input(s): AST, ALT, ALKPHOS, BILITOT, PROT, ALBUMIN  in the last 72 hours. No results for input(s): LIPASE, AMYLASE in the last 72 hours. CBC: Recent Labs    07/01/24 2305 07/03/24 0455  WBC 10.4 9.4  HGB 12.2* 12.2*  HCT 36.1* 36.9*  MCV 94.8 94.1  PLT 216 221   Cardiac Enzymes: Recent Labs    07/01/24 2305 07/02/24 0113  TROPONINIHS 43* 51*   BNP: Recent Labs    07/01/24 2306  BNP 83.5   D-Dimer: No results for input(s): DDIMER in the last 72 hours. Hemoglobin A1C: No results for input(s): HGBA1C in the last 72 hours. Fasting Lipid Panel: No results for input(s): CHOL, HDL, LDLCALC, TRIG, CHOLHDL, LDLDIRECT in the last 72 hours. Thyroid  Function Tests: No results for input(s): TSH, T4TOTAL, T3FREE, THYROIDAB in the last 72 hours.  Invalid input(s): FREET3 Anemia Panel: No results for input(s): VITAMINB12, FOLATE, FERRITIN, TIBC, IRON , RETICCTPCT in the last 72 hours.   Radiology:  CT Chest Wo Contrast Result Date: 07/02/2024 EXAM: CT CHEST WITHOUT CONTRAST 07/02/2024 12:43:41 AM TECHNIQUE: CT of the chest was performed without the administration of intravenous contrast. Multiplanar reformatted images are provided for review. Automated exposure control, iterative reconstruction, and/or weight based adjustment of the mA/kV was utilized to reduce the radiation dose to as low as  reasonably achievable. COMPARISON: 04/22/2023 CLINICAL HISTORY: Diffuse/interstitial lung disease. Per ed notes; Pt arrives via ems from home c/o pressure-like CP w/ tingling to left arm w/ sob that started 1 hour pta. Pt administered 324 ASA and 3 nitros prior to ems arrival w/out relief. Ems administered 1 nitroglycerin -spray en route, pt denied relief. Pt also reports dizziness all day. Pt on plavix . FINDINGS: MEDIASTINUM: Mild cardiomegaly. Thoracic aortic atherosclerosis, including the aortic root/valve. LYMPH NODES: No mediastinal, hilar or axillary lymphadenopathy. LUNGS AND PLEURA: Mild subpleural reticulation/fibrosis, lower lobe predominant, suggesting stable mild chronic lung disease. Scattered calcified granulomata, benign. No focal consolidation or pulmonary edema. No pleural effusion or pneumothorax. SOFT TISSUES/BONES: No acute abnormality of the bones or soft tissues. UPPER ABDOMEN: Limited images of the upper abdomen demonstrates no acute abnormality. IMPRESSION: 1. No acute cardiopulmonary abnormality. 2. Stable mild chronic lung disease. Electronically signed by: Pinkie Pebbles MD 07/02/2024 12:51 AM EDT RP Workstation: [id number]   DG Chest 1 View Result Date: 07/01/2024 CLINICAL DATA:  Pressure-like chest pain. EXAM: CHEST  1 VIEW COMPARISON:  Apr 26, 2024 FINDINGS: The heart size and mediastinal contours are within normal limits. Both lungs are clear. The visualized skeletal structures are unremarkable. IMPRESSION: No active disease. Electronically Signed   By: Suzen Dials M.D.   On: 07/01/2024 23:43    ECHO 04/24/24  1. Left ventricular ejection fraction, by estimation, is 60 to 65%. The left ventricle has normal function. The left ventricle has no regional wall motion abnormalities. Left ventricular diastolic parameters are consistent with Grade II diastolic dysfunction (pseudonormalization).   2. Right ventricular systolic function is normal. The right ventricular size is  normal.   3. Left atrial size was mildly dilated.   4. The mitral valve is normal in structure. Mild mitral valve regurgitation. No evidence of mitral stenosis.   5. The aortic valve is calcified. Aortic valve regurgitation is not visualized. Mild to moderate aortic valve stenosis. Aortic valve area, by  VTI measures 1.20 cm. Aortic valve mean gradient measures 25.3 mmHg. Aortic valve Vmax measures 3.16 m/s.   6. The inferior vena cava is normal in size with greater than 50% respiratory variability, suggesting right atrial pressure of 3 mmHg.   TELEMETRY reviewed by me 07/03/2024: Sinus rhythm, rate 60s.  EKG reviewed by me: sinus rhythm, rate 63 bpm without acute ischemic changes  Data reviewed by me 07/03/2024: last 24h vitals tele labs imaging I/O hospitalist progress notes.  Principal Problem:   Unstable angina (HCC) Active Problems:   Essential hypertension   Nonspecific chest pain   Orthopnea   Acute on chronic diastolic CHF (congestive heart failure) (HCC)   CKD (chronic kidney disease), stage IV (HCC)   Depression   Acute kidney injury superimposed on chronic kidney disease (HCC)   Dyslipidemia   Type 2 diabetes mellitus without complication, with long-term current use of insulin  (HCC)   Chronic GERD without esophagitis    ASSESSMENT AND PLAN:  Mike Decker is a 83 y.o. male  with a past medical history of CAD s/p PCI w/ DES RCA, prox LCx, jailed OM 2 but good flow, hypertension, hyperlipidemia chronic HFpEF, mild  to moderate AS, CKD 4, type 2 diabetes, carotid stenosis, COPD, OSA on CPAP, Barrett's esophagus, history of prostate cancer , who presented to the ED on 07/01/2024 for chest pain that radiates to left arm, associated with nausea, dyspnea, abdominal distention.  Patient has gained 6 pounds in the past 3 days.  Cardiology was consulted for further evaluation.   # Intermittent dizziness/ presyncope -Cardiac monitor placed to further evaluate.  # Chronic  HFpEF # Acute on CKD Patient with without SOB or LEE.   BNP within normal limits at 82.  CXR and CT chest without acute cardiopulmonary disease.  Appears euvolemic on exam. - Hold home torsemide  40 mg twice daily per nephrology. Resume when recommended by nephrology. - Hold home metolazone  2.5 mg once weekly per nephrology  (Nephrology recommend holding diuretics for 1-2 days deu to worsening Cr)  (baseline Cr is 2.8-3.1) - Continue home metoprolol  succinate 100 mg daily. - Will resume home Jardiance when renal function stabilizes. - Patient not a candidate for spironolactone, GFR < 30 - Nephrology consulted for worsening renal disease.   # Chronic Angina # Microvascular disease # CAD s/p multiple stents # Hypertension # Hyperlipidemia # Likely demand ischemia with uncontrolled BP Trops mildly elevated and flat 43 > 51 is most consistent with demand ischemia with uncontrolled BP.  EKG with sinus rhythm, rate 63 bpm without acute ischemic changes.  And is currently chest pain-free.  Echo from 04/2024 with preserved EF, no RWMA, grade 2 diastolic dysfunction. - Continue home Imdur  120 mg BID. - Continue metoprolol  as stated above. - Continue home diltiazem  30 mg 3 times daily. - Continue home Ranexa  500 mg twice daily. - Continue home Plavix  75 mg daily, atorvastatin  80 mg daily. - No plan for invasive cardiac diagnostic at this time as patient's renal function is significantly worse compared to 1 month ago. Patient has chronic angina and microvascular disease that is contributing to his anginal symptoms, will continue to manage with maximizing antianginal medications.   This patient's plan of care was discussed and created with Dr. Ammon and he is in agreement.  Signed: Dorene Comfort, PA-C  07/03/2024, 11:43 AM [facility] Cardiology

## 2024-07-03 NOTE — Assessment & Plan Note (Signed)
 Patient was complaining of food sticking specially pill in his lower chest causing pain. GI was consulted due to his history of Barrett's esophagus. Esophageal gram with concern of narrowing of lower esophagus. - GI is recommending liquid diet and pills crushed in applesauce. - Patient need outpatient esophageal dilatation after holding Plavix  for 5 days

## 2024-07-03 NOTE — Consult Note (Signed)
 Inpatient Consultation   Patient ID: Mike Decker is a 83 y.o. male.  Requesting Provider: Amaryllis Dare, MD  Date of Admission: 07/01/2024  Date of Consult: 07/03/24   Reason for Consultation: Dysphagia   Patient's Chief Complaint:   Chief Complaint  Patient presents with   Chest Pain    83 year old Caucasian male with history of esophageal adenocarcinoma status post ablation, Barrett's esophagus, GERD CAD status post PCI DES, COPD, CKD, HFpEF, prostate cancer, hypertension, OSA, stroke, DM2 who presented to the hospital with acute onset of chest pain (8 out of 10) with chest tightness radiating to his left arm and nausea.  GI is consulted for some discomfort with swallowing that occurred this morning.  Patient has noted weight gain of 6 pounds in 3 days along with orthopnea.  He had been consuming his regular diet as of Wednesday including things like chicken tenders without issue.  He denies any odynophagia or dysphagia symptoms prior to this morning.  He currently rates his chest pain 6 out of 10.  This morning he had some eggs applesauce and pills when he noted the discomfort.  He did not regurgitate or vomit any material up.  He is tolerating his secretions without issue.  His breathing is unlabored although he feels he is short of breath but that was preceding this morning.  Last endoscopy performed as below in 2024 with dilation.  Denies any lower GI symptoms.  He deals with reflux regularly and is on PPI.  He is scheduled to see the GI clinic in September after he was offered June dates but declined.  Cardiology evaluated yesterday and feels his chest discomfort is stable and likely chronic per their note.  On plavix  - last dose today Rcd heparin  dvt ppx today  Denies NSAIDs and anticoagulants Denies family history of gastrointestinal disease and malignancy Previous Endoscopies: Last performed 01/2023- Egd- esophgeal stenosis dilated to 15mm with tts balloon. Exam  otherwise normal  Multiple egds prior for ablation and surveillance of esophageal adenoca  08/2021 Barium swallow - Findings:   Normal pharyngeal morphology and function is noted.  There was no aspiration or penetration into the laryngeal vestibule. The esophagus is negative for stricture, ulceration, filling defects. Small posterior midline Zenker's diverticulum. There was normal esophageal motility.    Moderate hiatal hernia. The stomach and duodenal bulb appear normal without evidence of ulceration or mass. Spontaneous gastroesophageal reflux is seen extending superiorly just below the thoracic inlet. Normal gastric emptying is observed.    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, esphageal   Chronic diastolic CHF (congestive heart failure) (HCC)    Colon polyp    Diabetes mellitus without complication (HCC)    Diverticulosis    Gout    Heart disease    Hemangioma    liver   Hyperlipidemia    Hypertension    Myocardial infarct [facility])    Ocular hypertension    Peripheral vascular disease (HCC)    Skin cancer    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HCC)    Sleep apnea    Vitreoretinal degeneration     Past Surgical History:  Procedure Laterality Date   CATARACT EXTRACTION  2011, 2012   COLONOSCOPY N/A 12/14/2018   Procedure: COLONOSCOPY;  Surgeon: Janalyn Keene NOVAK, MD;  Location: [facility]  ENDOSCOPY;  Service: Endoscopy;  Laterality: N/A;   CORONARY ANGIOPLASTY WITH STENT PLACEMENT  12/10/2010   CORONARY STENT INTERVENTION N/A 12/29/2019   Procedure: CORONARY STENT INTERVENTION;  Surgeon: Mady Bruckner, MD;  Location: [facility] INVASIVE CV LAB;  Service: Cardiovascular;  Laterality: N/A;   ESOPHAGOGASTRODUODENOSCOPY N/A 12/14/2018   Procedure: ESOPHAGOGASTRODUODENOSCOPY (EGD);  Surgeon: Janalyn Keene NOVAK, MD;  Location: [facility] ENDOSCOPY;   Service: Endoscopy;  Laterality: N/A;   ESOPHAGOGASTRODUODENOSCOPY (EGD) WITH PROPOFOL  N/A 06/01/2016   Procedure: ESOPHAGOGASTRODUODENOSCOPY (EGD) WITH PROPOFOL ;  Surgeon: Lamar ONEIDA Holmes, MD;  Location: [facility] ENDOSCOPY;  Service: Endoscopy;  Laterality: N/A;   ESOPHAGOGASTRODUODENOSCOPY (EGD) WITH PROPOFOL  N/A 01/31/2023   Procedure: ESOPHAGOGASTRODUODENOSCOPY (EGD) WITH PROPOFOL ;  Surgeon: Jinny Carmine, MD;  Location: [facility] ENDOSCOPY;  Service: Endoscopy;  Laterality: N/A;   FEMORAL ARTERY STENT Right    HERNIA REPAIR     x2   INTRAOCULAR LENS INSERTION     LEFT HEART CATH AND CORONARY ANGIOGRAPHY N/A 05/09/2017   Procedure: Left Heart Cath and Coronary Angiography;  Surgeon: Florencio Cara BIRCH, MD;  Location: [facility] INVASIVE CV LAB;  Service: Cardiovascular;  Laterality: N/A;   LEFT HEART CATH AND CORONARY ANGIOGRAPHY N/A 12/08/2018   Procedure: LEFT HEART CATH AND CORONARY ANGIOGRAPHY and possible PCI and stent;  Surgeon: Florencio Cara BIRCH, MD;  Location: [facility] INVASIVE CV LAB;  Service: Cardiovascular;  Laterality: N/A;   LEFT HEART CATH AND CORONARY ANGIOGRAPHY N/A 12/29/2019   Procedure: LEFT HEART CATH AND CORONARY ANGIOGRAPHY;  Surgeon: Mady Bruckner, MD;  Location: [facility] INVASIVE CV LAB;  Service: Cardiovascular;  Laterality: N/A;   LEFT HEART CATH AND CORONARY ANGIOGRAPHY Left 12/13/2022   Procedure: LEFT HEART CATH AND CORONARY ANGIOGRAPHY;  Surgeon: Florencio Cara BIRCH, MD;  Location: [facility] INVASIVE CV LAB;  Service: Cardiovascular;  Laterality: Left;   NOSE SURGERY     submucous resection   PROSTATE SURGERY  12/10/2000   ROTATOR CUFF REPAIR Right     Allergies  Allergen Reactions   Gabapentin      Other reaction(s): Other (See Comments) Tremors   Peanut-Containing Drug Products Anaphylaxis   Penicillins Hives and Rash    Per notes - patient tolerated cefdinir in 2022    Bee Venom Swelling   Influenza Vaccines Hives   Inh [Isoniazid ] Hives   Kenalog [Triamcinolone Acetonide]  Hives   Levaquin  [Levofloxacin ] Other (See Comments)    Tendon, ligament pain.    Lisinopril      Other reaction(s): Hyperkalemia   Nalfon [Fenoprofen Calcium ] Hives   Naproxen    Peanut Oil    Nsaids Rash    Nalfon 600 Nalfon 600    Family History  Problem Relation Age of Onset   Arthritis Mother    Stroke Maternal Grandfather    Breast cancer Neg Hx     Social History   Tobacco Use   Smoking status: Former    Current packs/day: 0.00    Types: Cigarettes    Start date: 12/10/1956    Quit date: 12/10/1976    Years since quitting: 47.5   Smokeless tobacco: Never  Vaping Use   Vaping status: Never Used  Substance Use Topics   Alcohol use: No   Drug use: No     Pertinent GI related history and allergies were reviewed with the patient  Review of Systems  Constitutional:  Negative for activity change, appetite change, chills, diaphoresis, fatigue, fever and unexpected weight change.  HENT:  Negative for trouble swallowing and voice change.   Respiratory:  Positive  for shortness of breath. Negative for wheezing.   Cardiovascular:  Positive for chest pain. Negative for palpitations and leg swelling.  Gastrointestinal:  Negative for abdominal distention, abdominal pain, anal bleeding, blood in stool, constipation, diarrhea, nausea and vomiting.  Musculoskeletal:  Negative for arthralgias and myalgias.  Skin:  Negative for color change and pallor.  Neurological:  Negative for dizziness, syncope and weakness.  Psychiatric/Behavioral:  Negative for confusion. The patient is not nervous/anxious.   All other systems reviewed and are negative.    Medications Home Medications No current facility-administered medications on file prior to encounter.   Current Outpatient Medications on File Prior to Encounter  Medication Sig Dispense Refill   acetaminophen  (TYLENOL ) 500 MG tablet Take 1,000 mg by mouth every 8 (eight) hours as needed for mild pain.     atorvastatin  (LIPITOR ) 80 MG  tablet Take 1 tablet (80 mg total) by mouth daily. 30 tablet 0   azelastine  (ASTELIN ) 0.1 % nasal spray USE 1 TO 2 SPRAYS IN EACH NOSTRIL TWICE A DAY     calcitRIOL  (ROCALTROL ) 0.25 MCG capsule Take 0.25 mcg by mouth daily.     citalopram  (CELEXA ) 10 MG tablet Take 1 tablet (10 mg total) by mouth daily. 90 tablet 3   clopidogrel  (PLAVIX ) 75 MG tablet TAKE 1 TABLET BY MOUTH EVERY DAY 90 tablet 1   [employer] VITAMIN B12 1000 MCG tablet TAKE 1 TABLET BY MOUTH EVERY DAY 30 tablet 0   diltiazem  (CARDIZEM ) 30 MG tablet Take 1 tablet (30 mg total) by mouth 3 (three) times daily. Do not take this medication if SBP less than 110 and/or HR less than 60 90 tablet 0   docusate sodium  (COLACE) 100 MG capsule Take 100 mg by mouth 2 (two) times daily. (Patient taking differently: Take 200 mg by mouth daily.)     EPINEPHrine  (EPI-PEN) 0.3 mg/0.3 mL SOAJ injection Inject into the muscle as needed (anaphylaxis).     ergocalciferol  (VITAMIN D2) 1.25 MG (50000 UT) capsule Take 50,000 Units by mouth every 30 (thirty) days.     ferrous sulfate  325 (65 FE) MG EC tablet TAKE 1 TABLET BY MOUTH EVERY DAY 90 tablet 3   fluticasone  (FLONASE ) 50 MCG/ACT nasal spray USE 1 SPRAY IN EACH NOSTRIL EVERY DAY 32 g 6   hydrALAZINE  (APRESOLINE ) 50 MG tablet Take 25 mg by mouth 2 (two) times daily.     insulin  aspart (NOVOLOG ) 100 UNIT/ML FlexPen Inject 10-30 Units into the skin 3 (three) times daily with meals. Take 10 units before breakfast, 30 units before lunch and 30 units before supper.     insulin  glargine (LANTUS ) 100 UNIT/ML injection Inject 0.35 mLs (35 Units total) into the skin at bedtime. This is a decrease from your previous 45 units nightly. (Patient taking differently: Inject 60 Units into the skin at bedtime.) 10 mL 11   isosorbide  mononitrate (IMDUR ) 120 MG 24 hr tablet Take 1 tablet (120 mg total) by mouth 2 (two) times daily. 60 tablet 0   JARDIANCE 10 MG TABS tablet Take 10 mg by mouth daily.     loratadine  (CLARITIN ) 10  MG tablet Take 10 mg by mouth daily.     magnesium  oxide (MAG-OX) 400 (240 Mg) MG tablet Take 1 tablet by mouth daily.     methocarbamol  (ROBAXIN ) 500 MG tablet Take 500 mg by mouth 2 (two) times daily.     metolazone  (ZAROXOLYN ) 2.5 MG tablet Take 2.5 mg by mouth once a week.  metoprolol  succinate (TOPROL -XL) 100 MG 24 hr tablet Take 1 tablet (100 mg total) by mouth daily. Take with or immediately following a meal. 30 tablet 0   mometasone  (ASMANEX ) 220 MCG/ACT inhaler Inhale 2 puffs into the lungs daily.     montelukast  (SINGULAIR ) 10 MG tablet TAKE 1 TABLET AT BEDTIME 90 tablet 3   nitroGLYCERIN  (NITROSTAT ) 0.4 MG SL tablet Place 0.4 mg under the tongue every 5 (five) minutes x 3 doses as needed for chest pain.     pantoprazole  (PROTONIX ) 40 MG tablet Take 1 tablet (40 mg total) by mouth 2 (two) times daily before a meal.     ranolazine  (RANEXA ) 500 MG 12 hr tablet Take 1 tablet by mouth 2 (two) times daily.     Tiotropium Bromide-Olodaterol (STIOLTO RESPIMAT) 2.5-2.5 MCG/ACT AERS Inhale 1 each into the lungs daily.     torsemide  (DEMADEX ) 20 MG tablet Take 40 mg by mouth 2 (two) times daily.     Pertinent GI related medications were reviewed with the patient  Inpatient Medications  Current Facility-Administered Medications:    acetaminophen  (TYLENOL ) tablet 650 mg, 650 mg, Oral, Q6H PRN, 650 mg at 07/02/24 1842 **OR** acetaminophen  (TYLENOL ) suppository 650 mg, 650 mg, Rectal, Q6H PRN, Mansy, Jan A, MD   alum & mag hydroxide-simeth (MAALOX/MYLANTA) 200-200-20 MG/5ML suspension 30 mL, 30 mL, Oral, Q4H PRN, Amin, Sumayya, MD, 30 mL at 07/03/24 1012   atorvastatin  (LIPITOR ) tablet 80 mg, 80 mg, Oral, Daily, Mansy, Jan A, MD, 80 mg at 07/03/24 9175   calcitRIOL  (ROCALTROL ) capsule 0.25 mcg, 0.25 mcg, Oral, Daily, Mansy, Jan A, MD, 0.25 mcg at 07/03/24 9177   citalopram  (CELEXA ) tablet 10 mg, 10 mg, Oral, Daily, Mansy, Jan A, MD, 10 mg at 07/03/24 9177   clopidogrel  (PLAVIX ) tablet 75 mg, 75  mg, Oral, Daily, Mansy, Jan A, MD, 75 mg at 07/03/24 0827   colchicine  tablet 0.6 mg, 0.6 mg, Oral, BID PRN, Mansy, Jan A, MD   cyanocobalamin  (VITAMIN B12) tablet 1,000 mcg, 1,000 mcg, Oral, Daily, Mansy, Jan A, MD, 1,000 mcg at 07/03/24 9175   diltiazem  (CARDIZEM ) tablet 30 mg, 30 mg, Oral, TID, Mansy, Jan A, MD, 30 mg at 07/02/24 2244   EPINEPHrine  (EPI-PEN) injection 0.3 mg, 0.3 mg, Intramuscular, PRN, Mansy, Jan A, MD   ferrous sulfate  tablet 325 mg, 325 mg, Oral, Daily, Mansy, Jan A, MD, 325 mg at 07/03/24 9176   fluticasone  (FLONASE ) 50 MCG/ACT nasal spray 2 spray, 2 spray, Each Nare, Daily PRN, Mansy, Jan A, MD   heparin  injection 5,000 Units, 5,000 Units, Subcutaneous, Q8H, Amin, Sumayya, MD, 5,000 Units at 07/03/24 9442   hydrALAZINE  (APRESOLINE ) tablet 25 mg, 25 mg, Oral, BID, Mansy, Jan A, MD, 25 mg at 07/03/24 9177   insulin  aspart (novoLOG ) injection 0-5 Units, 0-5 Units, Subcutaneous, QHS, Amin, Sumayya, MD, 3 Units at 07/02/24 2239   insulin  aspart (novoLOG ) injection 0-9 Units, 0-9 Units, Subcutaneous, TID WC, Amin, Sumayya, MD, 3 Units at 07/03/24 9178   isosorbide  mononitrate (IMDUR ) 24 hr tablet 120 mg, 120 mg, Oral, BID, Amin, Sumayya, MD, 120 mg at 07/03/24 9176   magnesium  hydroxide (MILK OF MAGNESIA) suspension 30 mL, 30 mL, Oral, Daily PRN, Mansy, Jan A, MD   methocarbamol  (ROBAXIN ) tablet 500 mg, 500 mg, Oral, BID, Mansy, Jan A, MD, 500 mg at 07/03/24 0824   [START ON 07/10/2024] metolazone  (ZAROXOLYN ) tablet 2.5 mg, 2.5 mg, Oral, Weekly, Decoste, Gabriella, PA-C   metoprolol  succinate (TOPROL -XL) 24 hr tablet 100  mg, 100 mg, Oral, Daily, Mansy, Jan A, MD, 100 mg at 07/02/24 1007   montelukast  (SINGULAIR ) tablet 10 mg, 10 mg, Oral, QHS, Mansy, Jan A, MD, 10 mg at 07/02/24 2244   nitroGLYCERIN  (NITROSTAT ) SL tablet 0.4 mg, 0.4 mg, Sublingual, Q5 Min x 3 PRN, Mansy, Jan A, MD   ondansetron  (ZOFRAN ) tablet 4 mg, 4 mg, Oral, Q6H PRN **OR** ondansetron  (ZOFRAN ) injection 4 mg, 4  mg, Intravenous, Q6H PRN, Mansy, Jan A, MD   pantoprazole  (PROTONIX ) EC tablet 40 mg, 40 mg, Oral, BID AC, Mansy, Jan A, MD, 40 mg at 07/03/24 9176   ranolazine  (RANEXA ) 12 hr tablet 500 mg, 500 mg, Oral, BID, Mansy, Jan A, MD, 500 mg at 07/03/24 9175   torsemide  (DEMADEX ) tablet 40 mg, 40 mg, Oral, BID, Decoste, Gabriella, PA-C, 40 mg at 07/03/24 9175   traZODone  (DESYREL ) tablet 25 mg, 25 mg, Oral, QHS PRN, Mansy, Jan A, MD, 25 mg at 07/02/24 2244   acetaminophen  **OR** acetaminophen , alum & mag hydroxide-simeth, colchicine , EPINEPHrine , fluticasone , magnesium  hydroxide, nitroGLYCERIN , ondansetron  **OR** ondansetron  (ZOFRAN ) IV, traZODone    Objective   Vitals:   07/03/24 0729 07/03/24 0733 07/03/24 0801 07/03/24 0802  BP:  (!) 115/54    Pulse:  (!) 59    Resp: 11 18 11 14   Temp:  97.6 F (36.4 C)    TempSrc:      SpO2:  95%    Weight:      Height:         Physical Exam Vitals and nursing note reviewed.  Constitutional:      General: He is not in acute distress.    Appearance: Normal appearance. He is not ill-appearing, toxic-appearing or diaphoretic.  HENT:     Head: Normocephalic and atraumatic.     Nose: Nose normal.     Mouth/Throat:     Mouth: Mucous membranes are moist.     Pharynx: Oropharynx is clear.  Eyes:     General: No scleral icterus.    Extraocular Movements: Extraocular movements intact.  Cardiovascular:     Rate and Rhythm: Regular rhythm. Bradycardia present.  Pulmonary:     Effort: Pulmonary effort is normal. No respiratory distress.     Breath sounds: Normal breath sounds. No wheezing, rhonchi or rales.  Abdominal:     General: Bowel sounds are normal. There is no distension.     Palpations: Abdomen is soft.     Tenderness: There is no abdominal tenderness. There is no guarding or rebound.  Musculoskeletal:     Cervical back: Neck supple.     Right lower leg: No edema.     Left lower leg: No edema.  Skin:    General: Skin is warm and dry.      Coloration: Skin is not jaundiced or pale.  Neurological:     General: No focal deficit present.     Mental Status: He is alert and oriented to person, place, and time. Mental status is at baseline.  Psychiatric:        Mood and Affect: Mood normal.        Behavior: Behavior normal.        Thought Content: Thought content normal.        Judgment: Judgment normal.     Laboratory Data Recent Labs  Lab 07/01/24 2305 07/03/24 0455  WBC 10.4 9.4  HGB 12.2* 12.2*  HCT 36.1* 36.9*  PLT 216 221   Recent Labs  Lab 07/01/24 2305 07/03/24 0455  NA 136 137  K 3.7 3.4*  CL 96* 90*  CO2 27 30  BUN 106* 97*  CALCIUM  9.0 9.6  GLUCOSE 201* 212*   Recent Labs  Lab 07/01/24 2305  INR 1.1    No results for input(s): LIPASE in the last 72 hours.      Imaging Studies: CT Chest Wo Contrast Result Date: 07/02/2024 EXAM: CT CHEST WITHOUT CONTRAST 07/02/2024 12:43:41 AM TECHNIQUE: CT of the chest was performed without the administration of intravenous contrast. Multiplanar reformatted images are provided for review. Automated exposure control, iterative reconstruction, and/or weight based adjustment of the mA/kV was utilized to reduce the radiation dose to as low as reasonably achievable. COMPARISON: 04/22/2023 CLINICAL HISTORY: Diffuse/interstitial lung disease. Per ed notes; Pt arrives via ems from home c/o pressure-like CP w/ tingling to left arm w/ sob that started 1 hour pta. Pt administered 324 ASA and 3 nitros prior to ems arrival w/out relief. Ems administered 1 nitroglycerin -spray en route, pt denied relief. Pt also reports dizziness all day. Pt on plavix . FINDINGS: MEDIASTINUM: Mild cardiomegaly. Thoracic aortic atherosclerosis, including the aortic root/valve. LYMPH NODES: No mediastinal, hilar or axillary lymphadenopathy. LUNGS AND PLEURA: Mild subpleural reticulation/fibrosis, lower lobe predominant, suggesting stable mild chronic lung disease. Scattered calcified granulomata,  benign. No focal consolidation or pulmonary edema. No pleural effusion or pneumothorax. SOFT TISSUES/BONES: No acute abnormality of the bones or soft tissues. UPPER ABDOMEN: Limited images of the upper abdomen demonstrates no acute abnormality. IMPRESSION: 1. No acute cardiopulmonary abnormality. 2. Stable mild chronic lung disease. Electronically signed by: Pinkie Pebbles MD 07/02/2024 12:51 AM EDT RP Workstation: [id number]   DG Chest 1 View Result Date: 07/01/2024 CLINICAL DATA:  Pressure-like chest pain. EXAM: CHEST  1 VIEW COMPARISON:  Apr 26, 2024 FINDINGS: The heart size and mediastinal contours are within normal limits. Both lungs are clear. The visualized skeletal structures are unremarkable. IMPRESSION: No active disease. Electronically Signed   By: Suzen Dials M.D.   On: 07/01/2024 23:43    Assessment:    # Chest pain/ Chronic angina - pt p/w this overnight - cardiology has evaluated and feels this is chronic /stable per notation; troponin flat - Patient with a history of GERD and hiatal hernia, as well as Zenker's diverticulum; h/o esophageal stenosis - His chest discomfort has improved from yesterday although he noted some when taking pills applesauce and eggs this morning, but these passed down without issue.  He had previously been tolerating his normal diet yesterday and the day prior including things like chicken tenders - tolerating secretions and liquids; breathing unlabored - Heparin  drip discontinued 7/24 - Currently on Plavix  daily  # AKI on CKD  # GERD  #History of esophageal adenocarcinoma secondary to Barrett's esophagus status post RFA  # History of esophageal stenosis - Dilated to 15 mm with TTS balloon in February 2024  # HFpEF #CAD status post PCI DES on Plavix   #DM2  #COPD  #OSA  # HTN  # HLD  # Aortic stenosis  # carotid stenosis  #History of stroke   Plan:  Given the patient's comorbidities and current admission for chest pain,  will evaluate his esophagus with a barium swallow study at this point as he is at increased risk for complications with sedation Will make n.p.o. now If stricture is present, he will need outpatient dilation after holding Plavix  for 5 days If food impaction is present, would require EGD Given that he was tolerating foods like chicken tenders 2 days ago  without issue, lower suspicion that his esophagus became impacted with apple Sauce and eggs from this morning  Continue twice daily PPI Hold heparin  DVT prophylaxis for now  If no stricture and patient still having discomfort will prescribe viscous lidocaine  for relief  Further recommendations pending esophagram  Management of other medical comorbidities as per primary team  I personally performed the service.  Thank you for allowing us  to participate in this patient's care. Please don't hesitate to call if any questions or concerns arise.   [name], DO [facility]  Portions of the record may have been created with voice recognition software. Occasional wrong-word or 'sound-a-like' substitutions may have occurred due to the inherent limitations of voice recognition software.  Read the chart carefully and recognize, using context, where substitutions may have occurred.

## 2024-07-03 NOTE — Assessment & Plan Note (Signed)
 Acute on chronic HFpEF has been ruled out.  Clinically appears euvolemic.  No orthopnea, normal BNP and chest x-ray without any pulmonary vascular congestion.  Chronic exertional dyspnea. Did receive a dose of IV Lasix . -Nephrology is recommending holding diuretic for the next couple of days -Monitor volume status closely

## 2024-07-03 NOTE — Inpatient Diabetes Management (Addendum)
 Inpatient Diabetes Program Recommendations  AACE/ADA: New Consensus Statement on Inpatient Glycemic Control (2015)  Target Ranges:  Prepandial:   less than 140 mg/dL      Peak postprandial:   less than 180 mg/dL (1-2 hours)      Critically ill patients:  140 - 180 mg/dL   Lab Results  Component Value Date   GLUCAP 248 (H) 07/03/2024   HGBA1C 7.6 01/22/2024    Review of Glycemic Control  Latest Reference Range & Units 07/02/24 21:38 07/03/24 07:32 07/03/24 12:08  Glucose-Capillary 70 - 99 mg/dL 710 (H) 787 (H) 751 (H)   Diabetes history: DM  Outpatient Diabetes medications:  Novolog  10 units with breakfast, 30 units with lunch and supper Lantus  60 units daily Jardiance 10 mg daily Current orders for Inpatient glycemic control:  Novolog  0-9 units tid with meals and HS  Inpatient Diabetes Program Recommendations:    Please consider adding Semglee  20 units daily.    Thanks,  Randall Bullocks, RN, BC-ADM Inpatient Diabetes Coordinator Pager [phone]  (8a-5p)

## 2024-07-03 NOTE — Assessment & Plan Note (Addendum)
-   Holding home Cardizem ,  hydralazine , Imdur  and metoprolol  for today as blood pressure borderline soft - Holding torsemide  for the next few days

## 2024-07-03 NOTE — Assessment & Plan Note (Signed)
 Patient with intermittent pressure-like chest pain radiating to left arm, seems chronic.  Borderline troponin with a flat curve. Her symptoms likely from GI source with narrowing of his Barrett's esophagus as evident on esophageal gram today No further plan for any workup -Continue home metoprolol , Imdur  and Ranexa  - Continue home Plavix  and statin -Outpatient follow-up as cardiology signed off

## 2024-07-03 NOTE — Progress Notes (Signed)
 Progress Note   Patient: Mike Decker FMW:[id number] DOB: 03-Nov-1941 DOA: 07/01/2024     1 DOS: the patient was seen and examined on 07/03/2024   Brief hospital course: Taken from H&P.  Mike Decker is a 83 y.o. male with medical history significant for osteoarthritis, type 2 diabetes mellitus, diverticulosis, gout, coronary artery disease, hypertension, dyslipidemia and chronic diastolic CHF, mild to moderate AS, who presented to the emergency room with acute onset of chest pain felt chest tightness with graded 8/10 in severity with associated nausea and radiation to his left arm and associated dyspnea as well as abdominal distention.  He admitted to recent weight gain of 6 pounds in 3 days.  He has been having orthopnea and feels he is volume overloaded.   On presentation mildly elevated blood pressure at 149/50 otherwise stable vital, labs with glucose of 201, BUN 106, creatinine 4.35, troponin 43>> 51, BNP 83.5. EKG with NSR, borderline prolonged PR interval. CXR with no acute cardiopulmonary disease.  Patient received IV Lasix  along with a dose of morphine  and Zofran  in ED.  He was placed on nitroglycerin  drip due to recurrent chest pain, also started on heparin  infusion.  Cardiology was consulted.  7/24: Blood pressure elevated at 168/65, UA with some glucosuria only.  Continue to have intermittent chest pain-no exertional component.  No orthopnea, he chronically sleeps on a wedge due to history of GERD and Barrett's esophagus.  No recent change.  Clinically appears euvolemic with normal BNP.  Cardiology discontinued heparin  and nitroglycerin  infusion. Likely unstable angina which also seems chronic.  7/25: Vital stable, GI was consulted due to his complaint of food and pills sticking to lower esophagus with history of Barrett esophagus.  Creatinine remained above 4 with small improvement today.  CBG elevated so adding semglee . Cardiology signed off as there is no further  cardiac workup needed at this time.  Cardiac monitor was provided for concern for his complaint of intermittent presyncopal episodes. Esophageogram with concern of some narrowing, patient will need outpatient dilatation-need to hold Plavix  for 5 days before the procedure.  Currently recommending liquid diet and medications crushed in applesauce.   Assessment and Plan: * Unstable angina [facility]) Patient with intermittent pressure-like chest pain radiating to left arm, seems chronic.  Borderline troponin with a flat curve. Her symptoms likely from GI source with narrowing of his Barrett's esophagus as evident on esophageal gram today No further plan for any workup -Continue home metoprolol , Imdur  and Ranexa  - Continue home Plavix  and statin -Outpatient follow-up as cardiology signed off  CKD (chronic kidney disease), stage IV (HCC) Concern of AKI with CKD stage IV Creatinine above 4 with baseline seems to be around 2.8-3 -Nephrology was consulted -Holding diuretics -Monitor renal function -Avoid nephrotoxins  Acute on chronic diastolic CHF (congestive heart failure) (HCC) Acute on chronic HFpEF has been ruled out.  Clinically appears euvolemic.  No orthopnea, normal BNP and chest x-ray without any pulmonary vascular congestion.  Chronic exertional dyspnea. Did receive a dose of IV Lasix . -Nephrology is recommending holding diuretic for the next couple of days -Monitor volume status closely  Essential hypertension - Continuing home Cardizem ,  hydralazine , Imdur  and metoprolol  - Holding torsemide   Type 2 diabetes mellitus without complication, with long-term current use of insulin  (HCC) - SSI  Chronic GERD without esophagitis - Will continue PPI therapy.  Depression - Will continue Celexa   Dyslipidemia - Will continue statin therapy.  Dysphagia Patient was complaining of food sticking specially pill in his  lower chest causing pain. GI was consulted due to his history of  Barrett's esophagus. Esophageal gram with concern of narrowing of lower esophagus. - GI is recommending liquid diet and pills crushed in applesauce. - Patient need outpatient esophageal dilatation after holding Plavix  for 5 days   Subjective: Patient was complaining of food and pills sticking to his mid to lower chest causing pain.  He did had a regurg of some food earlier.  He was concerned that his Barrett's esophagus is acting up now.  Physical Exam: Vitals:   07/03/24 0733 07/03/24 0801 07/03/24 0802 07/03/24 1206  BP: (!) 115/54   (!) 142/58  Pulse: (!) 59   65  Resp: 18 11 14 18   Temp: 97.6 F (36.4 C)   97.8 F (36.6 C)  TempSrc:      SpO2: 95%   98%  Weight:      Height:       General.  Frail elderly man, in no acute distress. Pulmonary.  Lungs clear bilaterally, normal respiratory effort. CV.  Regular rate and rhythm, no JVD, rub or murmur. Abdomen.  Soft, nontender, nondistended, BS positive. CNS.  Alert and oriented .  No focal neurologic deficit. Extremities.  No edema,  pulses intact and symmetrical. Psychiatry.  Judgment and insight appears normal.   Data Reviewed: Prior data reviewed  Family Communication: Talked with wife on phone.  Disposition: Status is: Inpatient Remains inpatient appropriate because: Severity of illness  Planned Discharge Destination: Home with Home Health  Time spent: 50 minutes  This record has been created using Conservation officer, historic buildings. Errors have been sought and corrected,but may not always be located. Such creation errors do not reflect on the standard of care.   Author: Amaryllis Dare, MD 07/03/2024 2:45 PM  For on call review [email or web address].

## 2024-07-03 NOTE — Care Plan (Signed)
 Brief GI follow up note   Barium swallow performed. Read by me. Liquid passes without issue, however, barium tablet seems to hang up above an esophageal stenosis above a hiatal hernia. No signs of other foreign substances such as food.  Patient can consume hot tea/coffee to help dissolve this and allowing it to pass.  Patient is to be placed on liquid diet and pills should be crushed or cut for easier consumption.  Plavix  will need to be held for 5 days prior to perform EGD with dilation.  He did receive his Plavix  today, so the earliest would be on Wednesday.  Can likely be performed as an outpatient as long as patient follows dietary modifications/restrictions in the interim.  If patient still present in the hospital as of Wednesday next week, can discuss performing inpatient with GI Hospitalist. Otherwise, [facility] GI staff will reach out to the patient Monday to get EGD scheduled.  D/w patient's primary team, nurse, and patient himself who agrees to plan.  GI to sign off. Available as needed with call back. Please do not hesitate to call regarding questions or concerns.  Elspeth EMERSON Jungling, DO [facility]

## 2024-07-04 DIAGNOSIS — I200 Unstable angina: Secondary | ICD-10-CM | POA: Diagnosis not present

## 2024-07-04 DIAGNOSIS — I5033 Acute on chronic diastolic (congestive) heart failure: Secondary | ICD-10-CM | POA: Diagnosis not present

## 2024-07-04 DIAGNOSIS — R55 Syncope and collapse: Secondary | ICD-10-CM

## 2024-07-04 DIAGNOSIS — R079 Chest pain, unspecified: Secondary | ICD-10-CM | POA: Diagnosis not present

## 2024-07-04 DIAGNOSIS — N179 Acute kidney failure, unspecified: Secondary | ICD-10-CM | POA: Diagnosis not present

## 2024-07-04 LAB — RENAL FUNCTION PANEL
Albumin: 3.5 g/dL (ref 3.5–5.0)
Anion gap: 15 (ref 5–15)
BUN: 96 mg/dL — ABNORMAL HIGH (ref 8–23)
CO2: 27 mmol/L (ref 22–32)
Calcium: 9.2 mg/dL (ref 8.9–10.3)
Chloride: 91 mmol/L — ABNORMAL LOW (ref 98–111)
Creatinine, Ser: 3.76 mg/dL — ABNORMAL HIGH (ref 0.61–1.24)
GFR, Estimated: 15 mL/min — ABNORMAL LOW (ref >60)
Glucose, Bld: 221 mg/dL — ABNORMAL HIGH (ref 70–99)
Phosphorus: 4.7 mg/dL — ABNORMAL HIGH (ref 2.5–4.6)
Potassium: 3.9 mmol/L (ref 3.5–5.1)
Sodium: 133 mmol/L — ABNORMAL LOW (ref 135–145)

## 2024-07-04 LAB — RESP PANEL BY RT-PCR (RSV, FLU A&B, COVID)  RVPGX2
Influenza A by PCR: NEGATIVE
Influenza B by PCR: NEGATIVE
Resp Syncytial Virus by PCR: NEGATIVE
SARS Coronavirus 2 by RT PCR: NEGATIVE

## 2024-07-04 LAB — GLUCOSE, CAPILLARY
Glucose-Capillary: 238 mg/dL — ABNORMAL HIGH (ref 70–99)
Glucose-Capillary: 445 mg/dL — ABNORMAL HIGH (ref 70–99)
Glucose-Capillary: 191 mg/dL — ABNORMAL HIGH (ref 70–99)
Glucose-Capillary: 330 mg/dL — ABNORMAL HIGH (ref 70–99)

## 2024-07-04 LAB — GLUCOSE, RANDOM: Glucose, Bld: 384 mg/dL — ABNORMAL HIGH (ref 70–99)

## 2024-07-04 MED ORDER — INSULIN GLARGINE-YFGN 100 UNIT/ML ~~LOC~~ SOLN
8.0000 [IU] | Freq: Two times a day (BID) | SUBCUTANEOUS | Status: DC
  Administered 2024-07-04: 8 [IU] via SUBCUTANEOUS
  Filled 2024-07-04 (×2): qty 0.08

## 2024-07-04 MED ORDER — INSULIN ASPART 100 UNIT/ML IJ SOLN
15.0000 [IU] | Freq: Once | INTRAMUSCULAR | Status: AC
  Administered 2024-07-04: 15 [IU] via SUBCUTANEOUS
  Filled 2024-07-04: qty 1

## 2024-07-04 MED ORDER — ENSURE PLUS HIGH PROTEIN PO LIQD
237.0000 mL | Freq: Three times a day (TID) | ORAL | Status: DC
  Administered 2024-07-04 – 2024-07-05 (×3): 237 mL via ORAL

## 2024-07-04 MED ORDER — LACTATED RINGERS IV BOLUS
250.0000 mL | Freq: Once | INTRAVENOUS | Status: AC
  Administered 2024-07-04: 250 mL via INTRAVENOUS

## 2024-07-04 MED ORDER — INSULIN GLARGINE-YFGN 100 UNIT/ML ~~LOC~~ SOLN
10.0000 [IU] | Freq: Two times a day (BID) | SUBCUTANEOUS | Status: DC
  Administered 2024-07-04 – 2024-07-05 (×2): 10 [IU] via SUBCUTANEOUS
  Filled 2024-07-04 (×3): qty 0.1

## 2024-07-04 MED ORDER — LIDOCAINE VISCOUS HCL 2 % MT SOLN
15.0000 mL | Freq: Four times a day (QID) | OROMUCOSAL | Status: DC | PRN
  Administered 2024-07-04 – 2024-07-05 (×3): 15 mL via ORAL
  Filled 2024-07-04 (×5): qty 15

## 2024-07-04 MED ORDER — LACTATED RINGERS IV SOLN: INTRAVENOUS | Status: AC

## 2024-07-04 NOTE — Progress Notes (Addendum)
 [facility]  ROUNDING NOTE   Subjective:   Patient well-known to us  as we follow him for outpatient management of chronic kidney disease stage IV.  Update: Patient seen laying in bed Alert and oriented Feels fatigued today  Creatinine 3.76   Objective:  Vital signs in last 24 hours:  Temp:  [97.5 F (36.4 C)-98.6 F (37 C)] 97.7 F (36.5 C) (07/26 0716) Pulse Rate:  [61-96] 65 (07/26 0716) Resp:  [11-20] 20 (07/26 0410) BP: (70-165)/(39-80) 113/46 (07/26 0716) SpO2:  [95 %-99 %] 97 % (07/26 0716) Weight:  [82.5 kg] 82.5 kg (07/26 0600)  Weight change: 0.109 kg Filed Weights   07/01/24 2303 07/03/24 0535 07/04/24 0600  Weight: 83.9 kg 82.4 kg 82.5 kg    Intake/Output: I/O last 3 completed shifts: In: 1889.9 [P.O.:720; I.V.:669.6; IV Piggyback:500.3] Out: 875 [Urine:875]   Intake/Output this shift:  Total I/O In: 480 [P.O.:480] Out: -   Physical Exam: General: No acute distress  Head: Normocephalic, atraumatic. Moist oral mucosal membranes  Neck: Supple  Lungs:  Clear to auscultation, normal effort  Heart: S1S2 no rubs  Abdomen:  Soft, nontender, bowel sounds present  Extremities: Trace peripheral edema.  Neurologic: Awake, alert, following commands  Skin: No acute rash  Access: No hemodialysis access    Basic Metabolic Panel: Recent Labs  Lab 07/01/24 2305 07/03/24 0455 07/04/24 0433  NA 136 137 133*  K 3.7 3.4* 3.9  CL 96* 90* 91*  CO2 27 30 27   GLUCOSE 201* 212* 221*  BUN 106* 97* 96*  CREATININE 4.35* 4.12* 3.76*  CALCIUM  9.0 9.6 9.2  PHOS  --   --  4.7*    Liver Function Tests: Recent Labs  Lab 07/04/24 0433  ALBUMIN  3.5   No results for input(s): LIPASE, AMYLASE in the last 168 hours. No results for input(s): AMMONIA in the last 168 hours.  CBC: Recent Labs  Lab 07/01/24 2305 07/03/24 0455  WBC 10.4 9.4  HGB 12.2* 12.2*  HCT 36.1* 36.9*  MCV 94.8 94.1  PLT 216 221    Cardiac Enzymes: No results for  input(s): CKTOTAL, CKMB, CKMBINDEX, TROPONINI in the last 168 hours.  BNP: Invalid input(s): POCBNP  CBG: Recent Labs  Lab 07/03/24 1208 07/03/24 1603 07/03/24 2120 07/03/24 2314 07/04/24 0727  GLUCAP 248* 207* 262* 181* 238*    Microbiology: Results for orders placed or performed during the hospital encounter of 04/23/24  Culture, blood (routine x 2)     Status: None   Collection Time: 04/23/24  4:50 AM   Specimen: BLOOD RIGHT ARM  Result Value Ref Range Status   Specimen Description BLOOD RIGHT ARM  Final   Special Requests   Final    BOTTLES DRAWN AEROBIC AND ANAEROBIC Blood Culture adequate volume   Culture   Final    NO GROWTH 5 DAYS Performed at [facility], [address] [place], [place] [address]    Report Status 04/28/2024 FINAL  Final  Culture, blood (routine x 2)     Status: Abnormal   Collection Time: 04/23/24  4:50 AM   Specimen: BLOOD LEFT ARM  Result Value Ref Range Status   Specimen Description   Final    BLOOD LEFT ARM Performed at [facility], [address] [place], [place] [address]    Special Requests   Final    BOTTLES DRAWN AEROBIC AND ANAEROBIC Blood Culture adequate volume Performed at [facility], [address] [place], [place] [address]  Culture  Setup Time   Final    GRAM POSITIVE COCCI AEROBIC BOTTLE ONLY Organism ID to follow CRITICAL RESULT CALLED TO, READ BACK BY AND VERIFIED WITH: ALEX CHAPPELL @1033  04/24/24 MJU Performed at [facility], [address] [place], [place] [address]    Culture (A)  Final    STAPHYLOCOCCUS EPIDERMIDIS THE SIGNIFICANCE OF ISOLATING THIS ORGANISM FROM A SINGLE SET OF BLOOD CULTURES WHEN MULTIPLE SETS ARE DRAWN IS UNCERTAIN. PLEASE NOTIFY THE MICROBIOLOGY DEPARTMENT WITHIN ONE WEEK IF SPECIATION AND SENSITIVITIES ARE REQUIRED. Performed at [facility], [address], [place], [place] [address]    Report Status 04/25/2024 FINAL  Final   Resp panel by RT-PCR (RSV, Flu A&B, Covid) Urine, Clean Catch     Status: None   Collection Time: 04/23/24  4:50 AM   Specimen: Urine, Clean Catch; Nasal Swab  Result Value Ref Range Status   SARS Coronavirus 2 by RT PCR NEGATIVE NEGATIVE Final    Comment: (NOTE) SARS-CoV-2 target nucleic acids are NOT DETECTED.  The SARS-CoV-2 RNA is generally detectable in upper respiratory specimens during the acute phase of infection. The lowest concentration of SARS-CoV-2 viral copies this assay can detect is 138 copies/mL. A negative result does not preclude SARS-Cov-2 infection and should not be used as the sole basis for treatment or other patient management decisions. A negative result may occur with  improper specimen collection/handling, submission of specimen other than nasopharyngeal swab, presence of viral mutation(s) within the areas targeted by this assay, and inadequate number of viral copies(<138 copies/mL). A negative result must be combined with clinical observations, patient history, and epidemiological information. The expected result is Negative.  Fact Sheet for Patients:  BloggerCourse.com  Fact Sheet for Healthcare Providers:  SeriousBroker.it  This test is no t yet approved or cleared by the [place]  FDA and  has been authorized for detection and/or diagnosis of SARS-CoV-2 by FDA under an Emergency Use Authorization (EUA). This EUA will remain  in effect (meaning this test can be used) for the duration of the COVID-19 declaration under Section 564(b)(1) of the Act, 21 U.S.C.section 360bbb-3(b)(1), unless the authorization is terminated  or revoked sooner.       Influenza A by PCR NEGATIVE NEGATIVE Final   Influenza B by PCR NEGATIVE NEGATIVE Final    Comment: (NOTE) The Xpert Xpress SARS-CoV-2/FLU/RSV plus assay is intended as an aid in the diagnosis of influenza from Nasopharyngeal swab specimens and should  not be used as a sole basis for treatment. Nasal washings and aspirates are unacceptable for Xpert Xpress SARS-CoV-2/FLU/RSV testing.  Fact Sheet for Patients: BloggerCourse.com  Fact Sheet for Healthcare Providers: SeriousBroker.it  This test is not yet approved or cleared by the [place]  FDA and has been authorized for detection and/or diagnosis of SARS-CoV-2 by FDA under an Emergency Use Authorization (EUA). This EUA will remain in effect (meaning this test can be used) for the duration of the COVID-19 declaration under Section 564(b)(1) of the Act, 21 U.S.C. section 360bbb-3(b)(1), unless the authorization is terminated or revoked.     Resp Syncytial Virus by PCR NEGATIVE NEGATIVE Final    Comment: (NOTE) Fact Sheet for Patients: BloggerCourse.com  Fact Sheet for Healthcare Providers: SeriousBroker.it  This test is not yet approved or cleared by the [place]  FDA and has been authorized for detection and/or diagnosis of SARS-CoV-2 by FDA under an Emergency Use Authorization (EUA). This EUA will remain in effect (meaning this test can be used) for  the duration of the COVID-19 declaration under Section 564(b)(1) of the Act, 21 U.S.C. section 360bbb-3(b)(1), unless the authorization is terminated or revoked.  Performed at [facility], [address] [place], [place] [address]   Blood Culture ID Panel (Reflexed)     Status: Abnormal   Collection Time: 04/23/24  4:50 AM  Result Value Ref Range Status   Enterococcus faecalis NOT DETECTED NOT DETECTED Final   Enterococcus Faecium NOT DETECTED NOT DETECTED Final   Listeria monocytogenes NOT DETECTED NOT DETECTED Final   Staphylococcus species DETECTED (A) NOT DETECTED Final    Comment: CRITICAL RESULT CALLED TO, READ BACK BY AND VERIFIED WITH: ALEX CHAPPELL @1033  04/24/24 MJU    Staphylococcus aureus (BCID)  NOT DETECTED NOT DETECTED Final   Staphylococcus epidermidis DETECTED (A) NOT DETECTED Final    Comment: Methicillin (oxacillin) resistant coagulase negative staphylococcus. Possible blood culture contaminant (unless isolated from more than one blood culture draw or clinical case suggests pathogenicity). No antibiotic treatment is indicated for blood  culture contaminants. CRITICAL RESULT CALLED TO, READ BACK BY AND VERIFIED WITH: ALEX CHAPPELL @1033  04/24/24 MJU    Staphylococcus lugdunensis NOT DETECTED NOT DETECTED Final   Streptococcus species NOT DETECTED NOT DETECTED Final   Streptococcus agalactiae NOT DETECTED NOT DETECTED Final   Streptococcus pneumoniae NOT DETECTED NOT DETECTED Final   Streptococcus pyogenes NOT DETECTED NOT DETECTED Final   A.calcoaceticus-baumannii NOT DETECTED NOT DETECTED Final   Bacteroides fragilis NOT DETECTED NOT DETECTED Final   Enterobacterales NOT DETECTED NOT DETECTED Final   Enterobacter cloacae complex NOT DETECTED NOT DETECTED Final   Escherichia coli NOT DETECTED NOT DETECTED Final   Klebsiella aerogenes NOT DETECTED NOT DETECTED Final   Klebsiella oxytoca NOT DETECTED NOT DETECTED Final   Klebsiella pneumoniae NOT DETECTED NOT DETECTED Final   Proteus species NOT DETECTED NOT DETECTED Final   Salmonella species NOT DETECTED NOT DETECTED Final   Serratia marcescens NOT DETECTED NOT DETECTED Final   Haemophilus influenzae NOT DETECTED NOT DETECTED Final   Neisseria meningitidis NOT DETECTED NOT DETECTED Final   Pseudomonas aeruginosa NOT DETECTED NOT DETECTED Final   Stenotrophomonas maltophilia NOT DETECTED NOT DETECTED Final   Candida albicans NOT DETECTED NOT DETECTED Final   Candida auris NOT DETECTED NOT DETECTED Final   Candida glabrata NOT DETECTED NOT DETECTED Final   Candida krusei NOT DETECTED NOT DETECTED Final   Candida parapsilosis NOT DETECTED NOT DETECTED Final   Candida tropicalis NOT DETECTED NOT DETECTED Final   Cryptococcus  neoformans/gattii NOT DETECTED NOT DETECTED Final   Methicillin resistance mecA/C DETECTED (A) NOT DETECTED Final    Comment: CRITICAL RESULT CALLED TO, READ BACK BY AND VERIFIED WITH: ALEX CHAPPELL @1033  04/24/24 MJU Performed at [facility], [address] [place], [place] [address]     Coagulation Studies: Recent Labs    07/01/24 08-07-2304  LABPROT 14.3  INR 1.1    Urinalysis: Recent Labs    07/02/24 0113  COLORURINE STRAW*  LABSPEC 1.009  PHURINE 6.0  GLUCOSEU 150*  HGBUR NEGATIVE  BILIRUBINUR NEGATIVE  KETONESUR NEGATIVE  PROTEINUR NEGATIVE  NITRITE NEGATIVE  LEUKOCYTESUR NEGATIVE      Imaging: CT HEAD WO CONTRAST ( ) Result Date: 07/04/2024 CLINICAL DATA:  syncopal even with fall EXAM: CT HEAD WITHOUT CONTRAST TECHNIQUE: Contiguous axial images were obtained from the base of the skull through the vertex without intravenous contrast. RADIATION DOSE REDUCTION: This exam was performed according to the departmental dose-optimization program which includes automated exposure control, adjustment  of the mA and/or kV according to patient size and/or use of iterative reconstruction technique. COMPARISON:  MRI October 08, 2015. FINDINGS: Brain: No evidence of acute infarction, hemorrhage, hydrocephalus, extra-axial collection or mass lesion/mass effect. Cerebral atrophy with mild prominence of the extra-axial spaces bilaterally. 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 Vascular: No hyperdense vessel.  Calcific atherosclerosis. Skull: No acute fracture. Sinuses/Orbits: Mild paranasal sinus mucosal thickening. No acute orbital findings. Other: No mastoid effusions. IMPRESSION: No evidence of acute intracranial abnormality. Electronically Signed   By: Gilmore GORMAN Molt M.D.   On: 07/04/2024 00:44   US  RENAL Result Date: 07/03/2024 CLINICAL DATA:  Acute kidney injury EXAM: RENAL / URINARY TRACT ULTRASOUND COMPLETE COMPARISON:  Ultrasound 10/30/2023 FINDINGS: Right  Kidney: Renal measurements: 9.4 x 4.8 x 3.8 cm = volume: 88.4 mL. Cortex is slightly echogenic. No mass or hydronephrosis Left Kidney: Renal measurements: 9.7 x 5.2 x 4.5 cm = volume: 118.1 mL. Cortex is slightly echogenic. No mass or hydronephrosis Bladder: Appears normal for degree of bladder distention. Other: None. IMPRESSION: Negative for hydronephrosis. Slightly echogenic renal cortices as can be seen with medical renal disease. Electronically Signed   By: Luke Bun M.D.   On: 07/03/2024 20:10   DG ESOPHAGUS W SINGLE CM (SOL OR THIN BA) Result Date: 07/03/2024 CLINICAL DATA:  Provided history: Dysphagia. Additional history: The patient reports substernal chest pain when swallowing pills. History of esophageal dilation procedure. EXAM: ESOPHAGUS/BARIUM SWALLOW/TABLET STUDY TECHNIQUE: A single contrast examination was performed using thin liquid barium. Additionally, the patient swallowed a 13 mm barium tablet under fluoroscopy. The exam was performed by Carlin Griffon, PA-C, and was supervised and interpreted by Dr. Rockey Childs FLUOROSCOPY: Radiation Exposure Index (as provided by the fluoroscopic device): 53.40 mGy Kerma COMPARISON:  Upper GI series 07/23/2023. Chest CT 07/02/2024. FINDINGS: Limited examination due to the patient's inability to stand and limited ability to reposition on the fluoroscopy table. Redemonstrated small Zenker diverticulum. Also similar to the prior upper GI series of 07/23/2023, there is smooth focal narrowing at the level of distal esophagus. A swallowed 13 mm barium tablet did not pass beyond this level despite a prolonged period of observation, and despite the patient taking additional swallows of water and barium contrast. Esophagus normal in caliber and smooth in contour elsewhere. Prominent intermittent esophageal dysmotility with tertiary contractions. Small to moderate-sized hiatal hernia, unchanged. No gastroesophageal reflux observed. IMPRESSION: 1. Limited  examination as described. 2. Similar to the prior upper GI series of 07/23/2023, there is smooth focal narrowing at the level of distal esophagus. A swallowed 13 mm barium tablet did not pass beyond this level despite a prolonged period of observation, and despite the patient taking additional swallows of water and barium contrast. 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 3. Prominent esophageal dysmotility with tertiary contractions. 4. Known small Zenker diverticulum. 5. Small to moderate-sized hiatal hernia, unchanged. Electronically Signed   By: Rockey Childs D.O.   On: 07/03/2024 16:21     Medications:    sodium chloride  40 mL/hr at 07/04/24 0000    atorvastatin   80 mg Oral Daily   calcitRIOL   0.25 mcg Oral Daily   citalopram   10 mg Oral Daily   cyanocobalamin   1,000 mcg Oral Daily   diltiazem   30 mg Oral TID   feeding supplement  237 mL Oral TID BM   ferrous sulfate   325 mg Oral Daily   heparin  injection (subcutaneous)  5,000 Units Subcutaneous Q8H   hydrALAZINE   25 mg Oral BID   insulin  aspart  0-5 Units Subcutaneous QHS   insulin  aspart  0-9 Units Subcutaneous TID WC   insulin  glargine-yfgn  8 Units Subcutaneous BID   isosorbide  mononitrate  120 mg Oral BID   methocarbamol   500 mg Oral BID   metoprolol  succinate  100 mg Oral Daily   montelukast   10 mg Oral QHS   pantoprazole   40 mg Oral BID AC   ranolazine   500 mg Oral BID   acetaminophen  **OR** acetaminophen , alum & mag hydroxide-simeth, colchicine , EPINEPHrine , fluticasone , lidocaine , magnesium  hydroxide, nitroGLYCERIN , ondansetron  **OR** ondansetron  (ZOFRAN ) IV, traZODone   Assessment/ Plan:  83 y.o. male with past medical history of hypertension, diabetes mellitus type 2, diverticulosis, diabetic neuropathy, peripheral vascular disease, CVA, coronary disease status post and placement, diastolic heart failure, COPD, esophageal adenocarcinoma who presented with chest pain and discomfort with swallowing.  1.   Acute kidney injury/chronic kidney disease stage IV baseline creatinine 2.8 with EGFR 21 04/28/2024.  Suspect acute kidney injury now related to dysphagia and poor p.o. intake.  He was also on high-dose diuretics.  These have been held.    Renal ultrasound negative for obstruction. Creatinine is improving with gentle hydration. Continue for now.  2.  Anemia chronic kidney disease.  Hemoglobin remains 12.2.  No indication for Procrit.  3.  Secondary hyperparathyroidism.  Maintain the patient on Calcitrol 0.25 mcg p.o. daily and continue to monitor bone metabolism parameters periodically.     LOS: 2 Sakeena Teall 7/26/202511:48 AM

## 2024-07-04 NOTE — Plan of Care (Signed)

## 2024-07-04 NOTE — Assessment & Plan Note (Signed)
 Likely vasovagal as patient syncopized after using toilet.  Complaining of dizziness and lightheadedness before.  Resulted in fall but no significant injuries, CT head was negative for any acute abnormality. - Check orthostatic vitals -Giving some gentle IV fluid

## 2024-07-04 NOTE — Assessment & Plan Note (Signed)
 CBG elevated -Increasing Semglee  to 10 units twice daily -Continue with SSI

## 2024-07-04 NOTE — Progress Notes (Signed)
 [facility] CARDIOLOGY PROGRESS NOTE       Patient ID: Mike Decker MRN: [id number] DOB/AGE: January 28, 1941 83 y.o.  Admit date: 07/01/2024 Referring Physician Dr. Lawence Primary Physician Liana Fish, NP Primary Cardiologist Dr. Florencio Reason for Consultation AoCHFpEF, elevated troponins  HPI: Mike Decker is a 83 y.o. male  with a past medical history of chronic angina, microvascular disease, CAD s/p PCI w/ DES RCA, prox LCx, jailed OM 2 but good flow, HFpEF (EF 55-60% 08/2022), mild to moderate AS (mean grad 19 mmHg), CKD 4, type 2 diabetes, carotid stenosis, COPD, OSA on CPAP, Barrett's esophagus, history of prostate cancer , who presented to the ED on 07/01/2024 for chest pain that radiates to left arm, associated with nausea, dyspnea, abdominal distention.  Patient has gained 6 pounds in the past 3 days.  Cardiology was consulted for further evaluation.   Interval History: -Patient seen and examined this AM sitting upright on side of hospital bed. Had a syncopal event overnight associated with hypotension. Had some recurrence of dizziness this AM but overall BP improved.  -Overnight Tele showed no significant events.  -Patient remains on room air with stable SpO2.    Pertinent Cardiac History (Most recent) Cardiac PET Perfusion Test (01/02/2024):  1. No acute CT findings of the included chest. 2. Dense aortic valve calcifications. Correlate for echocardiographic evidence of aortic valve dysfunction. 3. Extensive three vessel coronary artery calcifications.   LHC (12/13/2022) Left ventriculogram preserved left ventricular function EF around 55 to 60% mild left ventricular enlargement  Left main large minor irregularities  LAD large with diffuse minor irregularities and a 50% mid lesion distal 50-75 Circumflex medium to large minor irregularities widely patent proximal stent OM1 diffusely diseased 50-75 small vessel RCA large proximal stent widely patent 50%  mid lesion mixed dominant All vessels with TIMI-3 flow Intervention deferred not indicated  Review of systems complete and found to be negative unless listed above    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, esphageal   Chronic diastolic CHF (congestive heart failure) (HCC)    Colon polyp    Diabetes mellitus without complication (HCC)    Diverticulosis    Gout    Heart disease    Hemangioma    liver   Hyperlipidemia    Hypertension    Myocardial infarct [facility])    Ocular hypertension    Peripheral vascular disease (HCC)    Skin cancer    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HCC)    Sleep apnea    Vitreoretinal degeneration     Past Surgical History:  Procedure Laterality Date   CATARACT EXTRACTION  2011, 2012   COLONOSCOPY N/A 12/14/2018   Procedure: COLONOSCOPY;  Surgeon: Janalyn Keene NOVAK, MD;  Location: [facility] ENDOSCOPY;  Service: Endoscopy;  Laterality: N/A;   CORONARY ANGIOPLASTY WITH STENT PLACEMENT  12/10/2010   CORONARY STENT INTERVENTION N/A 12/29/2019   Procedure: CORONARY STENT INTERVENTION;  Surgeon: Mady Bruckner, MD;  Location: [facility] INVASIVE CV LAB;  Service: Cardiovascular;  Laterality: N/A;   ESOPHAGOGASTRODUODENOSCOPY N/A 12/14/2018   Procedure: ESOPHAGOGASTRODUODENOSCOPY (EGD);  Surgeon: Janalyn Keene NOVAK, MD;  Location: [facility] ENDOSCOPY;  Service: Endoscopy;  Laterality: N/A;   ESOPHAGOGASTRODUODENOSCOPY (EGD) WITH PROPOFOL  N/A 06/01/2016   Procedure: ESOPHAGOGASTRODUODENOSCOPY (EGD) WITH PROPOFOL ;  Surgeon: Lamar ONEIDA Holmes, MD;  Location: [facility]  ENDOSCOPY;  Service: Endoscopy;  Laterality: N/A;   ESOPHAGOGASTRODUODENOSCOPY (EGD) WITH PROPOFOL  N/A 01/31/2023   Procedure: ESOPHAGOGASTRODUODENOSCOPY (EGD) WITH PROPOFOL ;  Surgeon: Jinny Carmine, MD;  Location: [facility] ENDOSCOPY;  Service: Endoscopy;  Laterality: N/A;   FEMORAL  ARTERY STENT Right    HERNIA REPAIR     x2   INTRAOCULAR LENS INSERTION     LEFT HEART CATH AND CORONARY ANGIOGRAPHY N/A 05/09/2017   Procedure: Left Heart Cath and Coronary Angiography;  Surgeon: Florencio Cara BIRCH, MD;  Location: [facility] INVASIVE CV LAB;  Service: Cardiovascular;  Laterality: N/A;   LEFT HEART CATH AND CORONARY ANGIOGRAPHY N/A 12/08/2018   Procedure: LEFT HEART CATH AND CORONARY ANGIOGRAPHY and possible PCI and stent;  Surgeon: Florencio Cara BIRCH, MD;  Location: [facility] INVASIVE CV LAB;  Service: Cardiovascular;  Laterality: N/A;   LEFT HEART CATH AND CORONARY ANGIOGRAPHY N/A 12/29/2019   Procedure: LEFT HEART CATH AND CORONARY ANGIOGRAPHY;  Surgeon: Mady Bruckner, MD;  Location: [facility] INVASIVE CV LAB;  Service: Cardiovascular;  Laterality: N/A;   LEFT HEART CATH AND CORONARY ANGIOGRAPHY Left 12/13/2022   Procedure: LEFT HEART CATH AND CORONARY ANGIOGRAPHY;  Surgeon: Florencio Cara BIRCH, MD;  Location: [facility] INVASIVE CV LAB;  Service: Cardiovascular;  Laterality: Left;   NOSE SURGERY     submucous resection   PROSTATE SURGERY  12/10/2000   ROTATOR CUFF REPAIR Right     Medications Prior to Admission  Medication Sig Dispense Refill Last Dose/Taking   acetaminophen  (TYLENOL ) 500 MG tablet Take 1,000 mg by mouth every 8 (eight) hours as needed for mild pain.   07/01/2024   atorvastatin  (LIPITOR ) 80 MG tablet Take 1 tablet (80 mg total) by mouth daily. 30 tablet 0 07/01/2024 Morning   azelastine  (ASTELIN ) 0.1 % nasal spray USE 1 TO 2 SPRAYS IN EACH NOSTRIL TWICE A DAY   07/01/2024 Evening   calcitRIOL  (ROCALTROL ) 0.25 MCG capsule Take 0.25 mcg by mouth daily.   07/01/2024 Evening   citalopram  (CELEXA ) 10 MG tablet Take 1 tablet (10 mg total) by mouth daily. 90 tablet 3 07/01/2024 Morning   clopidogrel  (PLAVIX ) 75 MG tablet TAKE 1 TABLET BY MOUTH EVERY DAY 90 tablet 1 07/01/2024 at  7:15 AM   [employer] VITAMIN B12 1000 MCG tablet TAKE 1 TABLET BY MOUTH EVERY DAY 30 tablet 0 07/01/2024 Morning    diltiazem  (CARDIZEM ) 30 MG tablet Take 1 tablet (30 mg total) by mouth 3 (three) times daily. Do not take this medication if SBP less than 110 and/or HR less than 60 90 tablet 0 07/01/2024 at  4:00 PM   docusate sodium  (COLACE) 100 MG capsule Take 100 mg by mouth 2 (two) times daily. (Patient taking differently: Take 200 mg by mouth daily.)   07/01/2024 Morning   EPINEPHrine  (EPI-PEN) 0.3 mg/0.3 mL SOAJ injection Inject into the muscle as needed (anaphylaxis).   Unknown   ergocalciferol  (VITAMIN D2) 1.25 MG (50000 UT) capsule Take 50,000 Units by mouth every 30 (thirty) days.   07/01/2024 Morning   ferrous sulfate  325 (65 FE) MG EC tablet TAKE 1 TABLET BY MOUTH EVERY DAY 90 tablet 3 07/01/2024 Morning   fluticasone  (FLONASE ) 50 MCG/ACT nasal spray USE 1 SPRAY IN EACH NOSTRIL EVERY DAY 32 g 6 07/01/2024   hydrALAZINE  (APRESOLINE ) 50 MG tablet Take 25 mg by mouth 2 (two) times daily.   07/01/2024 Evening   insulin  aspart (NOVOLOG ) 100 UNIT/ML FlexPen Inject 10-30 Units into the skin 3 (three) times daily with meals. Take 10 units before breakfast,  30 units before lunch and 30 units before supper.   07/01/2024 at  6:00 PM   insulin  glargine (LANTUS ) 100 UNIT/ML injection Inject 0.35 mLs (35 Units total) into the skin at bedtime. This is a decrease from your previous 45 units nightly. (Patient taking differently: Inject 60 Units into the skin at bedtime.) 10 mL 11 06/30/2024   isosorbide  mononitrate (IMDUR ) 120 MG 24 hr tablet Take 1 tablet (120 mg total) by mouth 2 (two) times daily. 60 tablet 0 07/01/2024 Morning   JARDIANCE 10 MG TABS tablet Take 10 mg by mouth daily.   07/01/2024 Morning   loratadine  (CLARITIN ) 10 MG tablet Take 10 mg by mouth daily.   07/01/2024 Morning   magnesium  oxide (MAG-OX) 400 (240 Mg) MG tablet Take 1 tablet by mouth daily.   06/30/2024   methocarbamol  (ROBAXIN ) 500 MG tablet Take 500 mg by mouth 2 (two) times daily.   07/01/2024 Evening   metolazone  (ZAROXOLYN ) 2.5 MG tablet Take 2.5 mg  by mouth once a week.   06/27/2024   metoprolol  succinate (TOPROL -XL) 100 MG 24 hr tablet Take 1 tablet (100 mg total) by mouth daily. Take with or immediately following a meal. 30 tablet 0 07/01/2024 Morning   mometasone  (ASMANEX ) 220 MCG/ACT inhaler Inhale 2 puffs into the lungs daily.   07/01/2024 Morning   montelukast  (SINGULAIR ) 10 MG tablet TAKE 1 TABLET AT BEDTIME 90 tablet 3 06/30/2024   nitroGLYCERIN  (NITROSTAT ) 0.4 MG SL tablet Place 0.4 mg under the tongue every 5 (five) minutes x 3 doses as needed for chest pain.   07/01/2024 Evening   pantoprazole  (PROTONIX ) 40 MG tablet Take 1 tablet (40 mg total) by mouth 2 (two) times daily before a meal.   07/01/2024 Evening   ranolazine  (RANEXA ) 500 MG 12 hr tablet Take 1 tablet by mouth 2 (two) times daily.   07/01/2024 Evening   Tiotropium Bromide-Olodaterol (STIOLTO RESPIMAT) 2.5-2.5 MCG/ACT AERS Inhale 1 each into the lungs daily.   07/01/2024 Morning   torsemide  (DEMADEX ) 20 MG tablet Take 40 mg by mouth 2 (two) times daily.   07/01/2024 Evening   Social History   Socioeconomic History   Marital status: Married    Spouse name: Not on file   Number of children: 2   Years of education: College 3 years   Highest education level: Associate degree: occupational, Scientist, product/process development, or vocational program  Occupational History   Occupation: retired  Tobacco Use   Smoking status: Former    Current packs/day: 0.00    Types: Cigarettes    Start date: 12/10/1956    Quit date: 12/10/1976    Years since quitting: 47.5   Smokeless tobacco: Never  Vaping Use   Vaping status: Never Used  Substance and Sexual Activity   Alcohol use: No   Drug use: No   Sexual activity: Not Currently  Other Topics Concern   Not on file  Social History Narrative   Not on file   Social Drivers of Health   Financial Resource Strain: Low Risk  (06/21/2023)   Received from [facility]   Overall Financial Resource Strain (CARDIA)    Difficulty of Paying Living  Expenses: Not very hard  Food Insecurity: No Food Insecurity (07/03/2024)   Hunger Vital Sign    Worried About Running Out of Food in the Last Year: Never true    Ran Out of Food in the Last Year: Never true  Transportation Needs: No Transportation Needs (07/03/2024)   PRAPARE -  Administrator, Civil Service (Medical): No    Lack of Transportation (Non-Medical): No  Physical Activity: Insufficiently Active (09/15/2021)   Exercise Vital Sign    Days of Exercise per Week: 7 days    Minutes of Exercise per Session: 20 min  Stress: No Stress Concern Present (09/15/2021)   [employer] of Occupational Health - Occupational Stress Questionnaire    Feeling of Stress : Not at all  Social Connections: Moderately Isolated (07/03/2024)   Social Connection and Isolation Panel    Frequency of Communication with Friends and Family: Once a week    Frequency of Social Gatherings with Friends and Family: More than three times a week    Attends Religious Services: Never    Database administrator or Organizations: No    Attends Banker Meetings: Never    Marital Status: Married  Catering manager Violence: Not At Risk (07/03/2024)   Humiliation, Afraid, Rape, and Kick questionnaire    Fear of Current or Ex-Partner: No    Emotionally Abused: No    Physically Abused: No    Sexually Abused: No    Family History  Problem Relation Age of Onset   Arthritis Mother    Stroke Maternal Grandfather    Breast cancer Neg Hx      Vitals:   07/04/24 0327 07/04/24 0410 07/04/24 0600 07/04/24 0716  BP: (!) 90/39 (!) 101/42  (!) 113/46  Pulse: 61   65  Resp: 18 20    Temp: 98.3 F (36.8 C)   97.7 F (36.5 C)  TempSrc:      SpO2: 95%   97%  Weight:   82.5 kg   Height:        PHYSICAL EXAM General: Well-appearing elderly male, well nourished, in no acute distress. HEENT: Normocephalic and atraumatic. Neck: No JVD.   Lungs: Normal respiratory effort on room air. Clear  bilaterally to auscultation. No wheezes, crackles, rhonchi.  Heart: HRRR. Normal S1 and S2 without gallops or murmurs.  Abdomen: Non-distended appearing.  Msk: Normal strength and tone for age. Extremities: Warm and well perfused. No clubbing, cyanosis, edema.  Neuro: Alert and oriented X 3. Psych: Answers questions appropriately.   Labs: Basic Metabolic Panel: Recent Labs    07/03/24 0455 07/04/24 0433  NA 137 133*  K 3.4* 3.9  CL 90* 91*  CO2 30 27  GLUCOSE 212* 221*  BUN 97* 96*  CREATININE 4.12* 3.76*  CALCIUM  9.6 9.2  PHOS  --  4.7*   Liver Function Tests: Recent Labs    07/04/24 0433  ALBUMIN  3.5   No results for input(s): LIPASE, AMYLASE in the last 72 hours. CBC: Recent Labs    07/01/24 2305 07/03/24 0455  WBC 10.4 9.4  HGB 12.2* 12.2*  HCT 36.1* 36.9*  MCV 94.8 94.1  PLT 216 221   Cardiac Enzymes: Recent Labs    07/01/24 2305 07/02/24 0113  TROPONINIHS 43* 51*   BNP: Recent Labs    07/01/24 2306  BNP 83.5   D-Dimer: No results for input(s): DDIMER in the last 72 hours. Hemoglobin A1C: No results for input(s): HGBA1C in the last 72 hours. Fasting Lipid Panel: No results for input(s): CHOL, HDL, LDLCALC, TRIG, CHOLHDL, LDLDIRECT in the last 72 hours. Thyroid  Function Tests: No results for input(s): TSH, T4TOTAL, T3FREE, THYROIDAB in the last 72 hours.  Invalid input(s): FREET3 Anemia Panel: No results for input(s): VITAMINB12, FOLATE, FERRITIN, TIBC, IRON , RETICCTPCT in the last 72 hours.  Radiology: CT HEAD WO CONTRAST ( ) Result Date: 07/04/2024 CLINICAL DATA:  syncopal even with fall EXAM: CT HEAD WITHOUT CONTRAST TECHNIQUE: Contiguous axial images were obtained from the base of the skull through the vertex without intravenous contrast. RADIATION DOSE REDUCTION: This exam was performed according to the departmental dose-optimization program which includes automated exposure control, adjustment  of the mA and/or kV according to patient size and/or use of iterative reconstruction technique. COMPARISON:  MRI October 08, 2015. FINDINGS: Brain: No evidence of acute infarction, hemorrhage, hydrocephalus, extra-axial collection or mass lesion/mass effect. Cerebral atrophy with mild prominence of the extra-axial spaces bilaterally. 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 Vascular: No hyperdense vessel.  Calcific atherosclerosis. Skull: No acute fracture. Sinuses/Orbits: Mild paranasal sinus mucosal thickening. No acute orbital findings. Other: No mastoid effusions. IMPRESSION: No evidence of acute intracranial abnormality. Electronically Signed   By: Gilmore GORMAN Molt M.D.   On: 07/04/2024 00:44   US  RENAL Result Date: 07/03/2024 CLINICAL DATA:  Acute kidney injury EXAM: RENAL / URINARY TRACT ULTRASOUND COMPLETE COMPARISON:  Ultrasound 10/30/2023 FINDINGS: Right Kidney: Renal measurements: 9.4 x 4.8 x 3.8 cm = volume: 88.4 mL. Cortex is slightly echogenic. No mass or hydronephrosis Left Kidney: Renal measurements: 9.7 x 5.2 x 4.5 cm = volume: 118.1 mL. Cortex is slightly echogenic. No mass or hydronephrosis Bladder: Appears normal for degree of bladder distention. Other: None. IMPRESSION: Negative for hydronephrosis. Slightly echogenic renal cortices as can be seen with medical renal disease. Electronically Signed   By: Luke Bun M.D.   On: 07/03/2024 20:10   DG ESOPHAGUS W SINGLE CM (SOL OR THIN BA) Result Date: 07/03/2024 CLINICAL DATA:  Provided history: Dysphagia. Additional history: The patient reports substernal chest pain when swallowing pills. History of esophageal dilation procedure. EXAM: ESOPHAGUS/BARIUM SWALLOW/TABLET STUDY TECHNIQUE: A single contrast examination was performed using thin liquid barium. Additionally, the patient swallowed a 13 mm barium tablet under fluoroscopy. The exam was performed by Carlin Griffon, PA-C, and was supervised and interpreted by Dr. Rockey Childs  FLUOROSCOPY: Radiation Exposure Index (as provided by the fluoroscopic device): 53.40 mGy Kerma COMPARISON:  Upper GI series 07/23/2023. Chest CT 07/02/2024. FINDINGS: Limited examination due to the patient's inability to stand and limited ability to reposition on the fluoroscopy table. Redemonstrated small Zenker diverticulum. Also similar to the prior upper GI series of 07/23/2023, there is smooth focal narrowing at the level of distal esophagus. A swallowed 13 mm barium tablet did not pass beyond this level despite a prolonged period of observation, and despite the patient taking additional swallows of water and barium contrast. Esophagus normal in caliber and smooth in contour elsewhere. Prominent intermittent esophageal dysmotility with tertiary contractions. Small to moderate-sized hiatal hernia, unchanged. No gastroesophageal reflux observed. IMPRESSION: 1. Limited examination as described. 2. Similar to the prior upper GI series of 07/23/2023, there is smooth focal narrowing at the level of distal esophagus. A swallowed 13 mm barium tablet did not pass beyond this level despite a prolonged period of observation, and despite the patient taking additional swallows of water and barium contrast. 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 3. Prominent esophageal dysmotility with tertiary contractions. 4. Known small Zenker diverticulum. 5. Small to moderate-sized hiatal hernia, unchanged. Electronically Signed   By: Rockey Childs D.O.   On: 07/03/2024 16:21   CT Chest Wo Contrast Result Date: 07/02/2024 EXAM: CT CHEST WITHOUT CONTRAST 07/02/2024 12:43:41 AM TECHNIQUE: CT of the chest was performed without the administration of intravenous contrast.  Multiplanar reformatted images are provided for review. Automated exposure control, iterative reconstruction, and/or weight based adjustment of the mA/kV was utilized to reduce the radiation dose to as low as reasonably achievable. COMPARISON:  04/22/2023 CLINICAL HISTORY: Diffuse/interstitial lung disease. Per ed notes; Pt arrives via ems from home c/o pressure-like CP w/ tingling to left arm w/ sob that started 1 hour pta. Pt administered 324 ASA and 3 nitros prior to ems arrival w/out relief. Ems administered 1 nitroglycerin -spray en route, pt denied relief. Pt also reports dizziness all day. Pt on plavix . FINDINGS: MEDIASTINUM: Mild cardiomegaly. Thoracic aortic atherosclerosis, including the aortic root/valve. LYMPH NODES: No mediastinal, hilar or axillary lymphadenopathy. LUNGS AND PLEURA: Mild subpleural reticulation/fibrosis, lower lobe predominant, suggesting stable mild chronic lung disease. Scattered calcified granulomata, benign. No focal consolidation or pulmonary edema. No pleural effusion or pneumothorax. SOFT TISSUES/BONES: No acute abnormality of the bones or soft tissues. UPPER ABDOMEN: Limited images of the upper abdomen demonstrates no acute abnormality. IMPRESSION: 1. No acute cardiopulmonary abnormality. 2. Stable mild chronic lung disease. Electronically signed by: Pinkie Pebbles MD 07/02/2024 12:51 AM EDT RP Workstation: [id number]   DG Chest 1 View Result Date: 07/01/2024 CLINICAL DATA:  Pressure-like chest pain. EXAM: CHEST  1 VIEW COMPARISON:  Apr 26, 2024 FINDINGS: The heart size and mediastinal contours are within normal limits. Both lungs are clear. The visualized skeletal structures are unremarkable. IMPRESSION: No active disease. Electronically Signed   By: Suzen Dials M.D.   On: 07/01/2024 23:43    ECHO 04/24/24  1. Left ventricular ejection fraction, by estimation, is 60 to 65%. The left ventricle has normal function. The left ventricle has no regional wall motion abnormalities. Left ventricular diastolic parameters are consistent with Grade II diastolic dysfunction (pseudonormalization).   2. Right ventricular systolic function is normal. The right ventricular size is normal.   3. Left atrial size was  mildly dilated.   4. The mitral valve is normal in structure. Mild mitral valve regurgitation. No evidence of mitral stenosis.   5. The aortic valve is calcified. Aortic valve regurgitation is not visualized. Mild to moderate aortic valve stenosis. Aortic valve area, by  VTI measures 1.20 cm. Aortic valve mean gradient measures 25.3 mmHg. Aortic valve Vmax measures 3.16 m/s.   6. The inferior vena cava is normal in size with greater than 50% respiratory variability, suggesting right atrial pressure of 3 mmHg.   TELEMETRY reviewed by me 07/04/2024: Sinus rhythm, rate 60s.  EKG reviewed by me: sinus rhythm, rate 63 bpm without acute ischemic changes  Data reviewed by me 07/04/2024: last 24h vitals tele labs imaging I/O hospitalist progress notes.  Principal Problem:   Unstable angina (HCC) Active Problems:   Essential hypertension   Nonspecific chest pain   Orthopnea   Acute on chronic diastolic CHF (congestive heart failure) (HCC)   CKD (chronic kidney disease), stage IV (HCC)   Depression   Dysphagia   Acute kidney injury superimposed on chronic kidney disease (HCC)   Dyslipidemia   Type 2 diabetes mellitus without complication, with long-term current use of insulin  (HCC)   Chronic GERD without esophagitis    ASSESSMENT AND PLAN:  Mike Decker is a 84 y.o. male  with a past medical history of CAD s/p PCI w/ DES RCA, prox LCx, jailed OM 2 but good flow, hypertension, hyperlipidemia chronic HFpEF, mild to moderate AS, CKD 4, type 2 diabetes, carotid stenosis, COPD, OSA on CPAP, Barrett's esophagus, history of prostate cancer , who presented to the  ED on 07/01/2024 for chest pain that radiates to left arm, associated with nausea, dyspnea, abdominal distention.  Patient has gained 6 pounds in the past 3 days.  Cardiology was consulted for further evaluation.   # Intermittent dizziness # Syncope -Cardiac monitor placed to further evaluate. -Suspect overnight episode related to  hypotension.   # Chronic HFpEF # Acute on CKD Patient with without SOB or LEE.   BNP within normal limits at 83.  CXR and CT chest without acute cardiopulmonary disease.  Appears euvolemic on exam. - Hold home torsemide  40 mg twice daily per nephrology. Resume when recommended by nephrology. - Hold home metolazone  2.5 mg once weekly per nephrology. (Nephrology recommend holding diuretics for 1-2 days due to worsening Cr - baseline Cr is 2.8-3.1) - Continue home metoprolol  succinate 100 mg daily. - Will resume home Jardiance when renal function stabilizes. - Patient not a candidate for spironolactone, GFR < 30 - Nephrology consulted for worsening renal disease, appreciate recommendations.   # Chronic Angina # Microvascular disease # CAD s/p multiple stents # Hypertension # Hyperlipidemia # Likely demand ischemia with uncontrolled BP Trops mildly elevated and flat 43 > 51 is most consistent with demand ischemia with uncontrolled BP.  EKG with sinus rhythm, rate 63 bpm without acute ischemic changes.  And is currently chest pain-free.  Echo from 04/2024 with preserved EF, no RWMA, grade 2 diastolic dysfunction. -HOLD PARAMETERS ADDED FOR BP MEDS DUE TO HYPOTENSION/SYNCOPE OVERNIGHT. - Continue home Imdur  120 mg BID. - Continue metoprolol  as stated above. - Continue home diltiazem  30 mg 3 times daily. - Continue home Ranexa  500 mg twice daily. - Continue home Plavix  75 mg daily, atorvastatin  80 mg daily. - No plan for invasive cardiac diagnostic at this time as patient's renal function is significantly worse compared to 1 month ago. Patient has chronic angina and microvascular disease that is contributing to his anginal symptoms, will continue to manage with maximizing antianginal medications.   This patient's plan of care was discussed and created with Dr. Florencio and he is in agreement.  Signed: Danita Bloch, PA-C  07/04/2024, 10:41 AM [facility] Cardiology

## 2024-07-04 NOTE — Significant Event (Signed)
       CROSS COVER NOTE  NAME: Mike Decker MRN: [id number] DOB : 21-Jul-1941 ATTENDING PHYSICIAN: Caleen Qualia, MD    Date of Service   07/04/2024   HPI/Events of Note   Rapid response called due to patient with syncopal event with fall when up to toilet. Head against floor when found, impact unknown.   HPI labs diagnostics reviewed  Interventions   Assessment/Plan:    07/03/2024   11:42 PM 07/03/2024   11:14 PM 07/03/2024    7:20 PM  Vitals with BMI  Systolic 93 94 125  Diastolic 43 60 51  Pulse 67 70 68   No visible injury excepot blleeding from IV site left frearm , no vision or mental status change. HOH unchanged. MAE equal strength . Alert and oriented x3; denies any pain, including chest pain EKG SR prolonged QT Initial VS with systolic in 70's  LR 250 ml bolus, repeated x1 to get MAP goal above 65 Ortho VS with am vitals Stat head CT r/o injury EEG Continue to monitor on tele Avoid QT prolonging meds        Erminio LITTIE Cone NP [facility] Cross Cover 7pm-7am - check amion for availability Pager [phone]

## 2024-07-04 NOTE — Care Management Important Message (Signed)
 Important Message  Patient Details  Name: Mike Decker MRN: [id number] Date of Birth: 01-Dec-1941   Important Message Given:  Yes - Medicare IM     [name] 07/04/2024, 1:39 PM

## 2024-07-04 NOTE — Progress Notes (Signed)
 Progress Note   Patient: Mike Decker FMW:[id number] DOB: 04-06-1941 DOA: 07/01/2024     2 DOS: the patient was seen and examined on 07/04/2024   Brief hospital course: Taken from H&P.  Mike Decker is a 83 y.o. male with medical history significant for osteoarthritis, type 2 diabetes mellitus, diverticulosis, gout, coronary artery disease, hypertension, dyslipidemia and chronic diastolic CHF, mild to moderate AS, who presented to the emergency room with acute onset of chest pain felt chest tightness with graded 8/10 in severity with associated nausea and radiation to his left arm and associated dyspnea as well as abdominal distention.  He admitted to recent weight gain of 6 pounds in 3 days.  He has been having orthopnea and feels he is volume overloaded.   On presentation mildly elevated blood pressure at 149/50 otherwise stable vital, labs with glucose of 201, BUN 106, creatinine 4.35, troponin 43>> 51, BNP 83.5. EKG with NSR, borderline prolonged PR interval. CXR with no acute cardiopulmonary disease.  Patient received IV Lasix  along with a dose of morphine  and Zofran  in ED.  He was placed on nitroglycerin  drip due to recurrent chest pain, also started on heparin  infusion.  Cardiology was consulted.  7/24: Blood pressure elevated at 168/65, UA with some glucosuria only.  Continue to have intermittent chest pain-no exertional component.  No orthopnea, he chronically sleeps on a wedge due to history of GERD and Barrett's esophagus.  No recent change.  Clinically appears euvolemic with normal BNP.  Cardiology discontinued heparin  and nitroglycerin  infusion. Likely unstable angina which also seems chronic.  7/25: Vital stable, GI was consulted due to his complaint of food and pills sticking to lower esophagus with history of Barrett esophagus.  Creatinine remained above 4 with small improvement today.  CBG elevated so adding semglee . Cardiology signed off as there is no further  cardiac workup needed at this time.  Cardiac monitor was provided for concern for his complaint of intermittent presyncopal episodes. Esophageogram with concern of some narrowing, patient will need outpatient dilatation-need to hold Plavix  for 5 days before the procedure.  Currently recommending liquid diet and medications crushed in applesauce.  7/26: Patient had overnight syncopal episode after using toilet, developed significant hypotension with systolic in 70s requiring 500 cc of bolus.  Stat CT head was negative for any acute abnormality.  Vital stable this morning, labs with improving renal function with creatinine at 3.76, CBG remained elevated, increasing Semglee  to 10 units twice daily. Plavix  was held today and GI will plan outpatient esophageal dilatation in mid to late next week.  Also having some productive cough, mild blood-tinged sputum couple of time per patient-ordered respiratory panel   Assessment and Plan: * Unstable angina [facility]) Patient with intermittent pressure-like chest pain radiating to left arm, seems chronic.  Borderline troponin with a flat curve. Her symptoms likely from GI source with narrowing of his Barrett's esophagus as evident on esophageal gram today No further plan for any workup -Continue home metoprolol , Imdur  and Ranexa  - Continue home Plavix  and statin -Outpatient follow-up as cardiology signed off  Syncope Likely vasovagal as patient syncopized after using toilet.  Complaining of dizziness and lightheadedness before.  Resulted in fall but no significant injuries, CT head was negative for any acute abnormality. - Check orthostatic vitals -Giving some gentle IV fluid  CKD (chronic kidney disease), stage IV (HCC) Concern of AKI with CKD stage IV Creatinine above 4 with baseline seems to be around 2.8-3.  Creatinine started improving, today at  3.76 after holding diuretic -Nephrology was consulted -Holding diuretics -Gentle hydration -Monitor renal  function -Avoid nephrotoxins  Acute on chronic diastolic CHF (congestive heart failure) (HCC) Acute on chronic HFpEF has been ruled out.  Clinically appears euvolemic.  No orthopnea, normal BNP and chest x-ray without any pulmonary vascular congestion.  Chronic exertional dyspnea. Did receive a dose of IV Lasix . -Nephrology is recommending holding diuretic for the next couple of days -Monitor volume status closely  Essential hypertension - Holding home Cardizem ,  hydralazine , Imdur  and metoprolol  for today as blood pressure borderline soft - Holding torsemide  for the next few days  Type 2 diabetes mellitus without complication, with long-term current use of insulin  (HCC) CBG elevated -Increasing Semglee  to 10 units twice daily -Continue with SSI  Chronic GERD without esophagitis - Will continue PPI therapy.  Depression - Will continue Celexa   Dyslipidemia - Will continue statin therapy.  Dysphagia Patient was complaining of food sticking specially pill in his lower chest causing pain. GI was consulted due to his history of Barrett's esophagus. Esophageal gram with concern of narrowing of lower esophagus. - GI is recommending liquid diet and pills crushed in applesauce. - Patient need outpatient esophageal dilatation after holding Plavix  for 5 days   Subjective: Patient was still feeling weak and dizzy when seen today.  No chest pain or shortness of breath.  Complaining of some productive cough and mild blood-tinged sputum.  Physical Exam: Vitals:   07/04/24 0410 07/04/24 0600 07/04/24 0716 07/04/24 1230  BP: (!) 101/42  (!) 113/46   Pulse:   65   Resp: 20   18  Temp:   97.7 F (36.5 C) 98.1 F (36.7 C)  TempSrc:    Oral  SpO2:   97% 95%  Weight:  82.5 kg    Height:       General.  Frail elderly man, in no acute distress. Pulmonary.  Lungs clear bilaterally, normal respiratory effort. CV.  Regular rate and rhythm, no JVD, rub or murmur. Abdomen.  Soft, nontender,  nondistended, BS positive. CNS.  Alert and oriented .  No focal neurologic deficit. Extremities.  No edema,  pulses intact and symmetrical. Psychiatry.  Judgment and insight appears normal.   Data Reviewed: Prior data reviewed  Family Communication: Called wife with no response today  Disposition: Status is: Inpatient Remains inpatient appropriate because: Severity of illness  Planned Discharge Destination: Home with Home Health  Time spent: 50 minutes  This record has been created using Conservation officer, historic buildings. Errors have been sought and corrected,but may not always be located. Such creation errors do not reflect on the standard of care.   Author: Amaryllis Dare, MD 07/04/2024 1:41 PM  For on call review [email or web address].

## 2024-07-04 NOTE — Significant Event (Signed)
 2300: This RN helped pt to the bathroom. Pt A&Ox4 and standby assist with no equipment. Once on the toilet, pt requested a few minutes of privacy to have a BM. RN educated pt to pull the cord on the wall when finished.   2310: Bathroom call light pulled and answered by this RN. Pt found sitting on the toilet, leaning to the right, the immediately fell to the ground, hitting his head. Pt appeared to lose consciousness. RN yelled for help. Staff arrived and lifted pt into a chair. Pt's eyes open to voice, but his body was flaccid and not following commands. Once in the chair, pt was able to answer orientation questions, but seemed lethargic, confused, and did not understand what had happened.   2315: VSS with soft BP. Staff brought pt to bed. Rapid called, situation discussed, and pt assessed. New right arm skin tear bleeding and covered with xeroform and mepilex. Left PIV bleeding around site, but site and PIV functional without leaking. New orders placed.  2335: Pt more alert and oriented with some brain fog asking for details about his fall. Pt denies pain, including in his head. Strengths symmetrical. BP low and bolus given. EKG completed.   0000: Pt got head CT scan.   0015: Pt was nauseous after CT scan and was given zofran . Medication confirmed with Erminio Cone, NP prior to administration.   0105: BP remains low. Another bolus given. Pt's neurological state is fully back to baseline.

## 2024-07-05 DIAGNOSIS — N179 Acute kidney failure, unspecified: Secondary | ICD-10-CM | POA: Diagnosis not present

## 2024-07-05 DIAGNOSIS — R0601 Orthopnea: Secondary | ICD-10-CM | POA: Diagnosis not present

## 2024-07-05 DIAGNOSIS — R079 Chest pain, unspecified: Secondary | ICD-10-CM | POA: Diagnosis not present

## 2024-07-05 DIAGNOSIS — I200 Unstable angina: Secondary | ICD-10-CM | POA: Diagnosis not present

## 2024-07-05 DIAGNOSIS — R55 Syncope and collapse: Secondary | ICD-10-CM

## 2024-07-05 LAB — GLUCOSE, CAPILLARY
Glucose-Capillary: 174 mg/dL — ABNORMAL HIGH (ref 70–99)
Glucose-Capillary: 361 mg/dL — ABNORMAL HIGH (ref 70–99)

## 2024-07-05 LAB — RENAL FUNCTION PANEL
Albumin: 3.6 g/dL (ref 3.5–5.0)
Anion gap: 9 (ref 5–15)
BUN: 71 mg/dL — ABNORMAL HIGH (ref 8–23)
CO2: 29 mmol/L (ref 22–32)
Calcium: 9.4 mg/dL (ref 8.9–10.3)
Chloride: 100 mmol/L (ref 98–111)
Creatinine, Ser: 3.04 mg/dL — ABNORMAL HIGH (ref 0.61–1.24)
GFR, Estimated: 20 mL/min — ABNORMAL LOW (ref >60)
Glucose, Bld: 197 mg/dL — ABNORMAL HIGH (ref 70–99)
Phosphorus: 2.8 mg/dL (ref 2.5–4.6)
Potassium: 4.6 mmol/L (ref 3.5–5.1)
Sodium: 138 mmol/L (ref 135–145)

## 2024-07-05 MED ORDER — COLCHICINE 0.6 MG PO TABS: 0.6000 mg | ORAL_TABLET | Freq: Two times a day (BID) | ORAL | 0 refills | Status: DC | PRN

## 2024-07-05 MED ORDER — ENSURE PLUS HIGH PROTEIN PO LIQD: 237.0000 mL | Freq: Three times a day (TID) | ORAL | 2 refills | Status: DC

## 2024-07-05 MED ORDER — DILTIAZEM HCL 30 MG PO TABS: 30.0000 mg | ORAL_TABLET | Freq: Two times a day (BID) | ORAL | 0 refills | Status: AC

## 2024-07-05 MED ORDER — LIDOCAINE VISCOUS HCL 2 % MT SOLN: 15.0000 mL | Freq: Four times a day (QID) | OROMUCOSAL | 0 refills | Status: DC | PRN

## 2024-07-05 NOTE — Discharge Summary (Signed)
 Physician Discharge Summary   Patient: Mike Decker MRN: [id number] DOB: 12-Nov-1941  Admit date:     07/01/2024  Discharge date: 07/05/24  Discharge Physician: Amaryllis Dare   PCP: Liana Fish, NP   Recommendations at discharge:  Please obtain CBC and BMP on follow-up Please keep holding Plavix  until esophageal dilatation is performed-gastroenterology to restart once appropriate Please keep holding torsemide  and metolazone  until seen by cardiologist and they can restart as appropriate If heart rate remains low cardiology might decrease the dose of metoprolol  as outpatient Follow-up with cardiology Follow-up with nephrology Follow-up with gastroenterology  Discharge Diagnoses: Principal Problem:   Unstable angina (HCC) Active Problems:   Acute on chronic diastolic CHF (congestive heart failure) (HCC)   CKD (chronic kidney disease), stage IV (HCC)   Acute kidney injury superimposed on chronic kidney disease (HCC)   Syncope   Essential hypertension   Type 2 diabetes mellitus without complication, with long-term current use of insulin  (HCC)   Chronic GERD without esophagitis   Depression   Dyslipidemia   Nonspecific chest pain   Orthopnea   Dysphagia   Hospital Course:  Mike Decker is a 83 y.o. male with medical history significant for osteoarthritis, type 2 diabetes mellitus, diverticulosis, gout, coronary artery disease, hypertension, dyslipidemia and chronic diastolic CHF, mild to moderate AS, who presented to the emergency room with acute onset of chest pain felt chest tightness with graded 8/10 in severity with associated nausea and radiation to his left arm and associated dyspnea as well as abdominal distention.  He admitted to recent weight gain of 6 pounds in 3 days.  He has been having orthopnea and feels he is volume overloaded.   On presentation mildly elevated blood pressure at 149/50 otherwise stable vital, labs with glucose of 201, BUN 106,  creatinine 4.35, troponin 43>> 51, BNP 83.5. EKG with NSR, borderline prolonged PR interval. CXR with no acute cardiopulmonary disease.  Patient received IV Lasix  along with a dose of morphine  and Zofran  in ED.  He was placed on nitroglycerin  drip due to recurrent chest pain, also started on heparin  infusion.  Cardiology was consulted.  7/24: Blood pressure elevated at 168/65, UA with some glucosuria only.  Continue to have intermittent chest pain-no exertional component.  No orthopnea, he chronically sleeps on a wedge due to history of GERD and Barrett's esophagus.  No recent change.  Clinically appears euvolemic with normal BNP.  Cardiology discontinued heparin  and nitroglycerin  infusion. Likely unstable angina which also seems chronic.  7/25: Vital stable, GI was consulted due to his complaint of food and pills sticking to lower esophagus with history of Barrett esophagus.  Creatinine remained above 4 with small improvement today.  CBG elevated so adding semglee . Cardiology signed off as there is no further cardiac workup needed at this time.  Cardiac monitor was provided for concern for his complaint of intermittent presyncopal episodes. Esophageogram with concern of some narrowing, patient will need outpatient dilatation-need to hold Plavix  for 5 days before the procedure.  Currently recommending liquid diet and medications crushed in applesauce.  7/26: Patient had overnight syncopal episode after using toilet, developed significant hypotension with systolic in 70s requiring 500 cc of bolus.  Stat CT head was negative for any acute abnormality.  Vital stable this morning, labs with improving renal function with creatinine at 3.76, CBG remained elevated, increasing Semglee  to 10 units twice daily. Plavix  was held today and GI will plan outpatient esophageal dilatation in mid to late next week.  Also having some productive cough, mild blood-tinged sputum couple of time per patient-respiratory  panel was negative and hemoglobin remained stable.  7/27: Remained hemodynamically stable with improving renal function and creatinine now close to baseline.  He will keep holding torsemide  and metolazone  until seen by cardiology within a week and they can restart when appropriate.  Patient also need a close follow-up with his nephrologist to monitor his renal function.  He was also instructed to keep holding Plavix  until seen by gastroenterologist and have his outpatient esophageal dilatation is done which is likely be midweek.  Patient will continue on liquid diet until that time.  His gastroenterologist will take over his further management regarding GI issues.  Patient was having intermittent chest pain which seems chronic and likely secondary to his esophageal issues.  ACS ruled out.  Patient was also given local lidocaine  to use as needed for esophageal discomfort.  We also decreased the dose of Cardizem  to 30 mg twice daily and he will continue with his home metoprolol , both of these medications are with parameters and patient understand that he will hold if his systolic blood pressure is less than 110 and his heart rate is less than 60.  Patient will continue on current medications and need to have a close follow-up with his providers for further assistance.  Assessment and Plan: * Unstable angina [facility]) Patient with intermittent pressure-like chest pain radiating to left arm, seems chronic.  Borderline troponin with a flat curve. Her symptoms likely from GI source with narrowing of his Barrett's esophagus as evident on esophageal gram today No further plan for any workup -Continue home metoprolol , Imdur  and Ranexa  - Continue home Plavix  and statin, Plavix  is currently being held for upcoming esophageal dilatation procedure -Outpatient follow-up with cardiology  Syncope Likely vasovagal as patient syncopized after using toilet.  Complaining of dizziness and lightheadedness before.   Resulted in fall but no significant injuries, CT head was negative for any acute abnormality. Continue to have intermittent dizziness which seems improving with gentle IV fluid.  Patient was instructed to keep himself well-hydrated and take metolazone  and Cardizem  with parameters.  CKD (chronic kidney disease), stage IV (HCC) Concern of AKI with CKD stage IV Creatinine above 4 with baseline seems to be around 2.8-3.   Creatinine started improving and it was 3.07 on the day of discharge with gentle IV fluid. Patient will keep holding diuretic and follow-up with his providers for further assistance  Acute on chronic diastolic CHF (congestive heart failure) (HCC) Acute on chronic HFpEF has been ruled out.  Clinically appears euvolemic.  No orthopnea, normal BNP and chest x-ray without any pulmonary vascular congestion.  Chronic exertional dyspnea. Did receive a dose of IV Lasix . -Nephrology is recommending holding diuretic for the next couple of days -Monitor volume status closely  Essential hypertension Home antihypertensives were initially held due to softer blood pressure, he will resume on discharge except diuretics. Cardizem  dose was decreased and parameters were placed for both Cardizem  and metoprolol .  Type 2 diabetes mellitus without complication, with long-term current use of insulin  (HCC) Will resume home medications on discharge  Chronic GERD without esophagitis - Will continue PPI therapy.  Depression - Will continue Celexa   Dyslipidemia - Will continue statin therapy.  Dysphagia Patient was complaining of food sticking specially pill in his lower chest causing pain. GI was consulted due to his history of Barrett's esophagus. Esophageal gram with concern of narrowing of lower esophagus. - GI is recommending liquid diet and  pills crushed in applesauce. - Patient need outpatient esophageal dilatation after holding Plavix  for 5 days  Consultants: Cardiology.  Nephrology.   Gastroenterology Procedures performed: None Disposition: Home health Diet recommendation:  Discharge Diet Orders (From admission, onward)     Start     Ordered   07/05/24 0000  Diet - low sodium heart healthy        07/05/24 1012           Full liquid diet DISCHARGE MEDICATION: Allergies as of 07/05/2024       Reactions   Gabapentin     Other reaction(s): Other (See Comments) Tremors   Peanut-containing Drug Products Anaphylaxis   Penicillins Hives, Rash   Per notes - patient tolerated cefdinir in 2022   Bee Venom Swelling   Influenza Vaccines Hives   Inh [isoniazid ] Hives   Kenalog [triamcinolone Acetonide] Hives   Levaquin  [levofloxacin ] Other (See Comments)   Tendon, ligament pain.    Lisinopril     Other reaction(s): Hyperkalemia   Nalfon [fenoprofen Calcium ] Hives   Naproxen    Peanut Oil    Nsaids Rash   Nalfon 600 Nalfon 600        Medication List     PAUSE taking these medications    clopidogrel  75 MG tablet Wait to take this until your doctor or other care provider tells you to start again. Commonly known as: PLAVIX  TAKE 1 TABLET BY MOUTH EVERY DAY   metolazone  2.5 MG tablet Wait to take this until your doctor or other care provider tells you to start again. Commonly known as: ZAROXOLYN  Take 2.5 mg by mouth once a week.   torsemide  20 MG tablet Wait to take this until your doctor or other care provider tells you to start again. Commonly known as: DEMADEX  Take 40 mg by mouth 2 (two) times daily.       TAKE these medications    acetaminophen  500 MG tablet Commonly known as: TYLENOL  Take 1,000 mg by mouth every 8 (eight) hours as needed for mild pain.   atorvastatin  80 MG tablet Commonly known as: LIPITOR  Take 1 tablet (80 mg total) by mouth daily.   azelastine  0.1 % nasal spray Commonly known as: ASTELIN  USE 1 TO 2 SPRAYS IN EACH NOSTRIL TWICE A DAY   calcitRIOL  0.25 MCG capsule Commonly known as: ROCALTROL  Take 0.25 mcg by mouth  daily.   citalopram  10 MG tablet Commonly known as: CELEXA  Take 1 tablet (10 mg total) by mouth daily.   colchicine  0.6 MG tablet Take 1 tablet (0.6 mg total) by mouth 2 (two) times daily as needed.   [employer] VITAMIN B12 1000 MCG tablet Generic drug: cyanocobalamin  TAKE 1 TABLET BY MOUTH EVERY DAY   diltiazem  30 MG tablet Commonly known as: CARDIZEM  Take 1 tablet (30 mg total) by mouth 2 (two) times daily. Do not take this medication if SBP less than 110 and/or HR less than 60 What changed: when to take this   docusate sodium  100 MG capsule Commonly known as: COLACE Take 100 mg by mouth 2 (two) times daily. What changed:  how much to take when to take this   EPINEPHrine  0.3 mg/0.3 mL Soaj injection Commonly known as: EPI-PEN Inject into the muscle as needed (anaphylaxis).   ergocalciferol  1.25 MG (50000 UT) capsule Commonly known as: VITAMIN D2 Take 50,000 Units by mouth every 30 (thirty) days.   feeding supplement Liqd Take 237 mLs by mouth 3 (three) times daily between meals.   ferrous sulfate   325 (65 FE) MG EC tablet TAKE 1 TABLET BY MOUTH EVERY DAY   fluticasone  50 MCG/ACT nasal spray Commonly known as: FLONASE  USE 1 SPRAY IN EACH NOSTRIL EVERY DAY   hydrALAZINE  50 MG tablet Commonly known as: APRESOLINE  Take 25 mg by mouth 2 (two) times daily.   insulin  aspart 100 UNIT/ML FlexPen Commonly known as: NOVOLOG  Inject 10-30 Units into the skin 3 (three) times daily with meals. Take 10 units before breakfast, 30 units before lunch and 30 units before supper.   insulin  glargine 100 UNIT/ML injection Commonly known as: LANTUS  Inject 0.35 mLs (35 Units total) into the skin at bedtime. This is a decrease from your previous 45 units nightly. What changed:  how much to take additional instructions   isosorbide  mononitrate 120 MG 24 hr tablet Commonly known as: IMDUR  Take 1 tablet (120 mg total) by mouth 2 (two) times daily.   Jardiance 10 MG Tabs tablet Generic  drug: empagliflozin Take 10 mg by mouth daily.   lidocaine  2 % solution Commonly known as: XYLOCAINE  Use as directed 15 mLs in the mouth or throat every 6 (six) hours as needed (throat/swallowing pain).   loratadine  10 MG tablet Commonly known as: CLARITIN  Take 10 mg by mouth daily.   magnesium  oxide 400 (240 Mg) MG tablet Commonly known as: MAG-OX Take 1 tablet by mouth daily.   methocarbamol  500 MG tablet Commonly known as: ROBAXIN  Take 500 mg by mouth 2 (two) times daily.   metoprolol  succinate 100 MG 24 hr tablet Commonly known as: TOPROL -XL Take 1 tablet (100 mg total) by mouth daily. Take with or immediately following a meal.   mometasone  220 MCG/ACT inhaler Commonly known as: ASMANEX  Inhale 2 puffs into the lungs daily.   montelukast  10 MG tablet Commonly known as: SINGULAIR  TAKE 1 TABLET AT BEDTIME   nitroGLYCERIN  0.4 MG SL tablet Commonly known as: NITROSTAT  Place 0.4 mg under the tongue every 5 (five) minutes x 3 doses as needed for chest pain.   pantoprazole  40 MG tablet Commonly known as: PROTONIX  Take 1 tablet (40 mg total) by mouth 2 (two) times daily before a meal.   ranolazine  500 MG 12 hr tablet Commonly known as: RANEXA  Take 1 tablet by mouth 2 (two) times daily.   Stiolto Respimat 2.5-2.5 MCG/ACT Aers Generic drug: Tiotropium Bromide-Olodaterol Inhale 1 each into the lungs daily.        Follow-up Information     Florencio Cara BIRCH, MD. Go in 1 week(s).   Specialties: Cardiology, Internal Medicine Contact information: [address] [place] [address] [phone]         Liana Fish, NP. Schedule an appointment as soon as possible for a visit in 1 week(s).   Specialty: Nurse Practitioner Contact information: [address] [place] [address] [phone]         Onita Elspeth Sharper, DO. Schedule an appointment as soon as possible for a visit.   Specialty: Gastroenterology Contact information: [address] [facility] [place] [address] [phone]                Discharge Exam: Fredricka Weights   07/03/24 0535 07/04/24 0600 07/05/24 0500  Weight: 82.4 kg 82.5 kg 83.6 kg   General.  Frail elderly man, in no acute distress. Pulmonary.  Lungs clear bilaterally, normal respiratory effort. CV.  Regular rate and rhythm, positive murmur. Abdomen.  Soft, nontender, nondistended, BS positive. CNS.  Alert and oriented .  No focal neurologic deficit.  Extremities.  No edema, no cyanosis, pulses intact and symmetrical. Psychiatry.  Judgment and insight appears normal.   Condition at discharge: stable  The results of significant diagnostics from this hospitalization (including imaging, microbiology, ancillary and laboratory) are listed below for reference.   Imaging Studies: CT HEAD WO CONTRAST ( ) Result Date: 07/04/2024 CLINICAL DATA:  syncopal even with fall EXAM: CT HEAD WITHOUT CONTRAST TECHNIQUE: Contiguous axial images were obtained from the base of the skull through the vertex without intravenous contrast. RADIATION DOSE REDUCTION: This exam was performed according to the departmental dose-optimization program which includes automated exposure control, adjustment of the mA and/or kV according to patient size and/or use of iterative reconstruction technique. COMPARISON:  MRI October 08, 2015. FINDINGS: Brain: No evidence of acute infarction, hemorrhage, hydrocephalus, extra-axial collection or mass lesion/mass effect. Cerebral atrophy with mild prominence of the extra-axial spaces bilaterally. 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 Vascular: No hyperdense vessel.  Calcific atherosclerosis. Skull: No acute fracture. Sinuses/Orbits: Mild paranasal sinus mucosal thickening. No acute orbital findings. Other: No mastoid effusions. IMPRESSION: No evidence of acute intracranial abnormality. Electronically Signed   By: Gilmore GORMAN Molt M.D.   On: 07/04/2024  00:44   US  RENAL Result Date: 07/03/2024 CLINICAL DATA:  Acute kidney injury EXAM: RENAL / URINARY TRACT ULTRASOUND COMPLETE COMPARISON:  Ultrasound 10/30/2023 FINDINGS: Right Kidney: Renal measurements: 9.4 x 4.8 x 3.8 cm = volume: 88.4 mL. Cortex is slightly echogenic. No mass or hydronephrosis Left Kidney: Renal measurements: 9.7 x 5.2 x 4.5 cm = volume: 118.1 mL. Cortex is slightly echogenic. No mass or hydronephrosis Bladder: Appears normal for degree of bladder distention. Other: None. IMPRESSION: Negative for hydronephrosis. Slightly echogenic renal cortices as can be seen with medical renal disease. Electronically Signed   By: Luke Bun M.D.   On: 07/03/2024 20:10   DG ESOPHAGUS W SINGLE CM (SOL OR THIN BA) Result Date: 07/03/2024 CLINICAL DATA:  Provided history: Dysphagia. Additional history: The patient reports substernal chest pain when swallowing pills. History of esophageal dilation procedure. EXAM: ESOPHAGUS/BARIUM SWALLOW/TABLET STUDY TECHNIQUE: A single contrast examination was performed using thin liquid barium. Additionally, the patient swallowed a 13 mm barium tablet under fluoroscopy. The exam was performed by Carlin Griffon, PA-C, and was supervised and interpreted by Dr. Rockey Childs FLUOROSCOPY: Radiation Exposure Index (as provided by the fluoroscopic device): 53.40 mGy Kerma COMPARISON:  Upper GI series 07/23/2023. Chest CT 07/02/2024. FINDINGS: Limited examination due to the patient's inability to stand and limited ability to reposition on the fluoroscopy table. Redemonstrated small Zenker diverticulum. Also similar to the prior upper GI series of 07/23/2023, there is smooth focal narrowing at the level of distal esophagus. A swallowed 13 mm barium tablet did not pass beyond this level despite a prolonged period of observation, and despite the patient taking additional swallows of water and barium contrast. Esophagus normal in caliber and smooth in contour elsewhere. Prominent  intermittent esophageal dysmotility with tertiary contractions. Small to moderate-sized hiatal hernia, unchanged. No gastroesophageal reflux observed. IMPRESSION: 1. Limited examination as described. 2. Similar to the prior upper GI series of 07/23/2023, there is smooth focal narrowing at the level of distal esophagus. A swallowed 13 mm barium tablet did not pass beyond this level despite a prolonged period of observation, and despite the patient taking additional swallows of water and barium contrast. 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 3. Prominent esophageal dysmotility with tertiary contractions. 4. Known small Zenker diverticulum. 5. Small to moderate-sized hiatal  hernia, unchanged. Electronically Signed   By: Rockey Childs D.O.   On: 07/03/2024 16:21   CT Chest Wo Contrast Result Date: 07/02/2024 EXAM: CT CHEST WITHOUT CONTRAST 07/02/2024 12:43:41 AM TECHNIQUE: CT of the chest was performed without the administration of intravenous contrast. Multiplanar reformatted images are provided for review. Automated exposure control, iterative reconstruction, and/or weight based adjustment of the mA/kV was utilized to reduce the radiation dose to as low as reasonably achievable. COMPARISON: 04/22/2023 CLINICAL HISTORY: Diffuse/interstitial lung disease. Per ed notes; Pt arrives via ems from home c/o pressure-like CP w/ tingling to left arm w/ sob that started 1 hour pta. Pt administered 324 ASA and 3 nitros prior to ems arrival w/out relief. Ems administered 1 nitroglycerin -spray en route, pt denied relief. Pt also reports dizziness all day. Pt on plavix . FINDINGS: MEDIASTINUM: Mild cardiomegaly. Thoracic aortic atherosclerosis, including the aortic root/valve. LYMPH NODES: No mediastinal, hilar or axillary lymphadenopathy. LUNGS AND PLEURA: Mild subpleural reticulation/fibrosis, lower lobe predominant, suggesting stable mild chronic lung disease. Scattered calcified granulomata, benign. No focal  consolidation or pulmonary edema. No pleural effusion or pneumothorax. SOFT TISSUES/BONES: No acute abnormality of the bones or soft tissues. UPPER ABDOMEN: Limited images of the upper abdomen demonstrates no acute abnormality. IMPRESSION: 1. No acute cardiopulmonary abnormality. 2. Stable mild chronic lung disease. Electronically signed by: Pinkie Pebbles MD 07/02/2024 12:51 AM EDT RP Workstation: [id number]   DG Chest 1 View Result Date: 07/01/2024 CLINICAL DATA:  Pressure-like chest pain. EXAM: CHEST  1 VIEW COMPARISON:  Apr 26, 2024 FINDINGS: The heart size and mediastinal contours are within normal limits. Both lungs are clear. The visualized skeletal structures are unremarkable. IMPRESSION: No active disease. Electronically Signed   By: Suzen Dials M.D.   On: 07/01/2024 23:43    Microbiology: Results for orders placed or performed during the hospital encounter of 07/01/24  Resp panel by RT-PCR (RSV, Flu A&B, Covid) Anterior Nasal Swab     Status: None   Collection Time: 07/04/24 12:57 PM   Specimen: Anterior Nasal Swab  Result Value Ref Range Status   SARS Coronavirus 2 by RT PCR NEGATIVE NEGATIVE Final    Comment: (NOTE) SARS-CoV-2 target nucleic acids are NOT DETECTED.  The SARS-CoV-2 RNA is generally detectable in upper respiratory specimens during the acute phase of infection. The lowest concentration of SARS-CoV-2 viral copies this assay can detect is 138 copies/mL. A negative result does not preclude SARS-Cov-2 infection and should not be used as the sole basis for treatment or other patient management decisions. A negative result may occur with  improper specimen collection/handling, submission of specimen other than nasopharyngeal swab, presence of viral mutation(s) within the areas targeted by this assay, and inadequate number of viral copies(<138 copies/mL). A negative result must be combined with clinical observations, patient history, and  epidemiological information. The expected result is Negative.  Fact Sheet for Patients:  BloggerCourse.com  Fact Sheet for Healthcare Providers:  SeriousBroker.it  This test is no t yet approved or cleared by the [place]  FDA and  has been authorized for detection and/or diagnosis of SARS-CoV-2 by FDA under an Emergency Use Authorization (EUA). This EUA will remain  in effect (meaning this test can be used) for the duration of the COVID-19 declaration under Section 564(b)(1) of the Act, 21 U.S.C.section 360bbb-3(b)(1), unless the authorization is terminated  or revoked sooner.       Influenza A by PCR NEGATIVE NEGATIVE Final   Influenza B by PCR NEGATIVE NEGATIVE Final  Comment: (NOTE) The Xpert Xpress SARS-CoV-2/FLU/RSV plus assay is intended as an aid in the diagnosis of influenza from Nasopharyngeal swab specimens and should not be used as a sole basis for treatment. Nasal washings and aspirates are unacceptable for Xpert Xpress SARS-CoV-2/FLU/RSV testing.  Fact Sheet for Patients: BloggerCourse.com  Fact Sheet for Healthcare Providers: SeriousBroker.it  This test is not yet approved or cleared by the [place]  FDA and has been authorized for detection and/or diagnosis of SARS-CoV-2 by FDA under an Emergency Use Authorization (EUA). This EUA will remain in effect (meaning this test can be used) for the duration of the COVID-19 declaration under Section 564(b)(1) of the Act, 21 U.S.C. section 360bbb-3(b)(1), unless the authorization is terminated or revoked.     Resp Syncytial Virus by PCR NEGATIVE NEGATIVE Final    Comment: (NOTE) Fact Sheet for Patients: BloggerCourse.com  Fact Sheet for Healthcare Providers: SeriousBroker.it  This test is not yet approved or cleared by the [place]  FDA and has been  authorized for detection and/or diagnosis of SARS-CoV-2 by FDA under an Emergency Use Authorization (EUA). This EUA will remain in effect (meaning this test can be used) for the duration of the COVID-19 declaration under Section 564(b)(1) of the Act, 21 U.S.C. section 360bbb-3(b)(1), unless the authorization is terminated or revoked.  Performed at [facility], [address] [place], [place] [address]     Labs: CBC: Recent Labs  Lab 07/01/24 2305 07/03/24 0455  WBC 10.4 9.4  HGB 12.2* 12.2*  HCT 36.1* 36.9*  MCV 94.8 94.1  PLT 216 221   Basic Metabolic Panel: Recent Labs  Lab 07/01/24 2305 07/03/24 0455 07/04/24 0433 07/04/24 1318 07/05/24 0909  NA 136 137 133*  --  138  K 3.7 3.4* 3.9  --  4.6  CL 96* 90* 91*  --  100  CO2 27 30 27   --  29  GLUCOSE 201* 212* 221* 384* 197*  BUN 106* 97* 96*  --  71*  CREATININE 4.35* 4.12* 3.76*  --  3.04*  CALCIUM  9.0 9.6 9.2  --  9.4  PHOS  --   --  4.7*  --  2.8   Liver Function Tests: Recent Labs  Lab 07/04/24 0433 07/05/24 0909  ALBUMIN  3.5 3.6   CBG: Recent Labs  Lab 07/04/24 0727 07/04/24 1235 07/04/24 1552 07/04/24 2034 07/05/24 0816  GLUCAP 238* 445* 191* 330* 174*    Discharge time spent: greater than 30 minutes.  This record has been created using Conservation officer, historic buildings. Errors have been sought and corrected,but may not always be located. Such creation errors do not reflect on the standard of care.   Signed: Amaryllis Dare, MD [facility] 07/05/2024

## 2024-07-05 NOTE — Progress Notes (Signed)
 [facility]  ROUNDING NOTE   Subjective:   Patient well-known to us  as we follow him for outpatient management of chronic kidney disease stage IV.  Update: Patient seen sitting at side of bed Currently tolerating breakfast Continues to express difficulty swallowing Remains on room air  Creatinine 3.04   Objective:  Vital signs in last 24 hours:  Temp:  [97.6 F (36.4 C)-98.3 F (36.8 C)] 97.9 F (36.6 C) (07/27 1144) Pulse Rate:  [56-71] 61 (07/27 1144) Resp:  [13-20] 20 (07/27 0408) BP: (115-155)/(41-64) 118/41 (07/27 1144) SpO2:  [92 %-100 %] 94 % (07/27 1144) FiO2 (%):  [21 %] 21 % (07/26 2245) Weight:  [83.6 kg] 83.6 kg (07/27 0500)  Weight change: 1.134 kg Filed Weights   07/03/24 0535 07/04/24 0600 07/05/24 0500  Weight: 82.4 kg 82.5 kg 83.6 kg    Intake/Output: I/O last 3 completed shifts: In: 2780.5 [P.O.:960; I.V.:1320.2; IV Piggyback:500.3] Out: 3835 [Urine:3835]   Intake/Output this shift:  Total I/O In: 480 [P.O.:480] Out: -   Physical Exam: General: No acute distress  Head: Normocephalic, atraumatic. Moist oral mucosal membranes  Neck: Supple  Lungs:  Clear to auscultation, normal effort  Heart: S1S2 no rubs  Abdomen:  Soft, nontender, bowel sounds present  Extremities: Trace peripheral edema.  Neurologic: Awake, alert, following commands  Skin: No acute rash  Access: No hemodialysis access    Basic Metabolic Panel: Recent Labs  Lab 07/01/24 2305 07/03/24 0455 07/04/24 0433 07/04/24 1318 07/05/24 0909  NA 136 137 133*  --  138  K 3.7 3.4* 3.9  --  4.6  CL 96* 90* 91*  --  100  CO2 27 30 27   --  29  GLUCOSE 201* 212* 221* 384* 197*  BUN 106* 97* 96*  --  71*  CREATININE 4.35* 4.12* 3.76*  --  3.04*  CALCIUM  9.0 9.6 9.2  --  9.4  PHOS  --   --  4.7*  --  2.8    Liver Function Tests: Recent Labs  Lab 07/04/24 0433 07/05/24 0909  ALBUMIN  3.5 3.6   No results for input(s): LIPASE, AMYLASE in the last 168  hours. No results for input(s): AMMONIA in the last 168 hours.  CBC: Recent Labs  Lab 07/01/24 2305 07/03/24 0455  WBC 10.4 9.4  HGB 12.2* 12.2*  HCT 36.1* 36.9*  MCV 94.8 94.1  PLT 216 221    Cardiac Enzymes: No results for input(s): CKTOTAL, CKMB, CKMBINDEX, TROPONINI in the last 168 hours.  BNP: Invalid input(s): POCBNP  CBG: Recent Labs  Lab 07/04/24 1235 07/04/24 1552 07/04/24 2034 07/05/24 0816 07/05/24 1145  GLUCAP 445* 191* 330* 174* 361*    Microbiology: Results for orders placed or performed during the hospital encounter of 07/01/24  Resp panel by RT-PCR (RSV, Flu A&B, Covid) Anterior Nasal Swab     Status: None   Collection Time: 07/04/24 12:57 PM   Specimen: Anterior Nasal Swab  Result Value Ref Range Status   SARS Coronavirus 2 by RT PCR NEGATIVE NEGATIVE Final    Comment: (NOTE) SARS-CoV-2 target nucleic acids are NOT DETECTED.  The SARS-CoV-2 RNA is generally detectable in upper respiratory specimens during the acute phase of infection. The lowest concentration of SARS-CoV-2 viral copies this assay can detect is 138 copies/mL. A negative result does not preclude SARS-Cov-2 infection and should not be used as the sole basis for treatment or other patient management decisions. A negative result may occur with  improper specimen collection/handling, submission  of specimen other than nasopharyngeal swab, presence of viral mutation(s) within the areas targeted by this assay, and inadequate number of viral copies(<138 copies/mL). A negative result must be combined with clinical observations, patient history, and epidemiological information. The expected result is Negative.  Fact Sheet for Patients:  BloggerCourse.com  Fact Sheet for Healthcare Providers:  SeriousBroker.it  This test is no t yet approved or cleared by the [place]  FDA and  has been authorized for detection and/or  diagnosis of SARS-CoV-2 by FDA under an Emergency Use Authorization (EUA). This EUA will remain  in effect (meaning this test can be used) for the duration of the COVID-19 declaration under Section 564(b)(1) of the Act, 21 U.S.C.section 360bbb-3(b)(1), unless the authorization is terminated  or revoked sooner.       Influenza A by PCR NEGATIVE NEGATIVE Final   Influenza B by PCR NEGATIVE NEGATIVE Final    Comment: (NOTE) The Xpert Xpress SARS-CoV-2/FLU/RSV plus assay is intended as an aid in the diagnosis of influenza from Nasopharyngeal swab specimens and should not be used as a sole basis for treatment. Nasal washings and aspirates are unacceptable for Xpert Xpress SARS-CoV-2/FLU/RSV testing.  Fact Sheet for Patients: BloggerCourse.com  Fact Sheet for Healthcare Providers: SeriousBroker.it  This test is not yet approved or cleared by the [place]  FDA and has been authorized for detection and/or diagnosis of SARS-CoV-2 by FDA under an Emergency Use Authorization (EUA). This EUA will remain in effect (meaning this test can be used) for the duration of the COVID-19 declaration under Section 564(b)(1) of the Act, 21 U.S.C. section 360bbb-3(b)(1), unless the authorization is terminated or revoked.     Resp Syncytial Virus by PCR NEGATIVE NEGATIVE Final    Comment: (NOTE) Fact Sheet for Patients: BloggerCourse.com  Fact Sheet for Healthcare Providers: SeriousBroker.it  This test is not yet approved or cleared by the [place]  FDA and has been authorized for detection and/or diagnosis of SARS-CoV-2 by FDA under an Emergency Use Authorization (EUA). This EUA will remain in effect (meaning this test can be used) for the duration of the COVID-19 declaration under Section 564(b)(1) of the Act, 21 U.S.C. section 360bbb-3(b)(1), unless the authorization is terminated  or revoked.  Performed at [facility], [address] [place], [place] [address]     Coagulation Studies: No results for input(s): LABPROT, INR in the last 72 hours.   Urinalysis: No results for input(s): COLORURINE, LABSPEC, PHURINE, GLUCOSEU, HGBUR, BILIRUBINUR, KETONESUR, PROTEINUR, UROBILINOGEN, NITRITE, LEUKOCYTESUR in the last 72 hours.  Invalid input(s): APPERANCEUR     Imaging: CT HEAD WO CONTRAST ( ) Result Date: 07/04/2024 CLINICAL DATA:  syncopal even with fall EXAM: CT HEAD WITHOUT CONTRAST TECHNIQUE: Contiguous axial images were obtained from the base of the skull through the vertex without intravenous contrast. RADIATION DOSE REDUCTION: This exam was performed according to the departmental dose-optimization program which includes automated exposure control, adjustment of the mA and/or kV according to patient size and/or use of iterative reconstruction technique. COMPARISON:  MRI October 08, 2015. FINDINGS: Brain: No evidence of acute infarction, hemorrhage, hydrocephalus, extra-axial collection or mass lesion/mass effect. Cerebral atrophy with mild prominence of the extra-axial spaces bilaterally. 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 Vascular: No hyperdense vessel.  Calcific atherosclerosis. Skull: No acute fracture. Sinuses/Orbits: Mild paranasal sinus mucosal thickening. No acute orbital findings. Other: No mastoid effusions. IMPRESSION: No evidence of acute intracranial abnormality. Electronically Signed   By: Gilmore GORMAN Molt M.D.   On: 07/04/2024 00:44  US  RENAL Result Date: 07/03/2024 CLINICAL DATA:  Acute kidney injury EXAM: RENAL / URINARY TRACT ULTRASOUND COMPLETE COMPARISON:  Ultrasound 10/30/2023 FINDINGS: Right Kidney: Renal measurements: 9.4 x 4.8 x 3.8 cm = volume: 88.4 mL. Cortex is slightly echogenic. No mass or hydronephrosis Left Kidney: Renal measurements: 9.7 x 5.2 x 4.5 cm = volume: 118.1 mL.  Cortex is slightly echogenic. No mass or hydronephrosis Bladder: Appears normal for degree of bladder distention. Other: None. IMPRESSION: Negative for hydronephrosis. Slightly echogenic renal cortices as can be seen with medical renal disease. Electronically Signed   By: Luke Bun M.D.   On: 07/03/2024 20:10   DG ESOPHAGUS W SINGLE CM (SOL OR THIN BA) Result Date: 07/03/2024 CLINICAL DATA:  Provided history: Dysphagia. Additional history: The patient reports substernal chest pain when swallowing pills. History of esophageal dilation procedure. EXAM: ESOPHAGUS/BARIUM SWALLOW/TABLET STUDY TECHNIQUE: A single contrast examination was performed using thin liquid barium. Additionally, the patient swallowed a 13 mm barium tablet under fluoroscopy. The exam was performed by Carlin Griffon, PA-C, and was supervised and interpreted by Dr. Rockey Childs FLUOROSCOPY: Radiation Exposure Index (as provided by the fluoroscopic device): 53.40 mGy Kerma COMPARISON:  Upper GI series 07/23/2023. Chest CT 07/02/2024. FINDINGS: Limited examination due to the patient's inability to stand and limited ability to reposition on the fluoroscopy table. Redemonstrated small Zenker diverticulum. Also similar to the prior upper GI series of 07/23/2023, there is smooth focal narrowing at the level of distal esophagus. A swallowed 13 mm barium tablet did not pass beyond this level despite a prolonged period of observation, and despite the patient taking additional swallows of water and barium contrast. Esophagus normal in caliber and smooth in contour elsewhere. Prominent intermittent esophageal dysmotility with tertiary contractions. Small to moderate-sized hiatal hernia, unchanged. No gastroesophageal reflux observed. IMPRESSION: 1. Limited examination as described. 2. Similar to the prior upper GI series of 07/23/2023, there is smooth focal narrowing at the level of distal esophagus. A swallowed 13 mm barium tablet did not pass beyond this  level despite a prolonged period of observation, and despite the patient taking additional swallows of water and barium contrast. 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 3. Prominent esophageal dysmotility with tertiary contractions. 4. Known small Zenker diverticulum. 5. Small to moderate-sized hiatal hernia, unchanged. Electronically Signed   By: Rockey Childs D.O.   On: 07/03/2024 16:21     Medications:      atorvastatin   80 mg Oral Daily   calcitRIOL   0.25 mcg Oral Daily   citalopram   10 mg Oral Daily   cyanocobalamin   1,000 mcg Oral Daily   diltiazem   30 mg Oral TID   feeding supplement  237 mL Oral TID BM   ferrous sulfate   325 mg Oral Daily   heparin  injection (subcutaneous)  5,000 Units Subcutaneous Q8H   hydrALAZINE   25 mg Oral BID   insulin  aspart  0-5 Units Subcutaneous QHS   insulin  aspart  0-9 Units Subcutaneous TID WC   insulin  glargine-yfgn  10 Units Subcutaneous BID   isosorbide  mononitrate  120 mg Oral BID   methocarbamol   500 mg Oral BID   metoprolol  succinate  100 mg Oral Daily   montelukast   10 mg Oral QHS   pantoprazole   40 mg Oral BID AC   ranolazine   500 mg Oral BID   acetaminophen  **OR** acetaminophen , alum & mag hydroxide-simeth, colchicine , EPINEPHrine , fluticasone , lidocaine , magnesium  hydroxide, nitroGLYCERIN , ondansetron  **OR** ondansetron  (ZOFRAN ) IV, traZODone   Assessment/ Plan:  83 y.o. male with past medical history of hypertension, diabetes mellitus type 2, diverticulosis, diabetic neuropathy, peripheral vascular disease, CVA, coronary disease status post and placement, diastolic heart failure, COPD, esophageal adenocarcinoma who presented with chest pain and discomfort with swallowing.  1.  Acute kidney injury/chronic kidney disease stage IV baseline creatinine 2.8 with EGFR 21 04/28/2024.  Suspect acute kidney injury now related to dysphagia and poor p.o. intake.  He was also on high-dose diuretics.  These have been held.    Renal  ultrasound negative for obstruction.  Creatinine has returned to baseline.  Will schedule follow-up appointment in our office with Dr. Douglas at discharge.  2.  Anemia chronic kidney disease.  Hemoglobin remains 12.2.  No indication for Procrit.  3.  Secondary hyperparathyroidism.  Maintain the patient on Calcitrol 0.25 mcg p.o. daily and continue to monitor bone metabolism parameters periodically.     LOS: 3 Mike Decker 7/27/202512:33 PM

## 2024-07-05 NOTE — Progress Notes (Signed)
 [facility] CLINIC CARDIOLOGY PROGRESS NOTE       Patient ID: Mike Decker MRN: [id number] DOB/AGE: June 21, 1941 83 y.o.  Admit date: 07/01/2024 Referring Physician Dr. Lawence Primary Physician Liana Fish, NP Primary Cardiologist Dr. Florencio Reason for Consultation AoCHFpEF, elevated troponins  HPI: Mike Decker is a 83 y.o. male  with a past medical history of chronic angina, microvascular disease, CAD s/p PCI w/ DES RCA, prox LCx, jailed OM 2 but good flow, HFpEF (EF 55-60% 08/2022), mild to moderate AS (mean grad 19 mmHg), CKD 4, type 2 diabetes, carotid stenosis, COPD, OSA on CPAP, Barrett's esophagus, history of prostate cancer , who presented to the ED on 07/01/2024 for chest pain that radiates to left arm, associated with nausea, dyspnea, abdominal distention.  Patient has gained 6 pounds in the past 3 days.  Cardiology was consulted for further evaluation.   Interval History: -Patient seen and examined this AM resting in hospital bed.  -Denies CP, SOB this AM. No dizziness, BP improved overall.  -Overnight Tele showed no significant events.  -Patient remains on room air with stable SpO2.    Pertinent Cardiac History (Most recent) Cardiac PET Perfusion Test (01/02/2024):  1. No acute CT findings of the included chest. 2. Dense aortic valve calcifications. Correlate for echocardiographic evidence of aortic valve dysfunction. 3. Extensive three vessel coronary artery calcifications.   LHC (12/13/2022) Left ventriculogram preserved left ventricular function EF around 55 to 60% mild left ventricular enlargement  Left main large minor irregularities  LAD large with diffuse minor irregularities and a 50% mid lesion distal 50-75 Circumflex medium to large minor irregularities widely patent proximal stent OM1 diffusely diseased 50-75 small vessel RCA large proximal stent widely patent 50% mid lesion mixed dominant All vessels with TIMI-3 flow Intervention deferred  not indicated  Review of systems complete and found to be negative unless listed above    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, esphageal   Chronic diastolic CHF (congestive heart failure) (HCC)    Colon polyp    Diabetes mellitus without complication (HCC)    Diverticulosis    Gout    Heart disease    Hemangioma    liver   Hyperlipidemia    Hypertension    Myocardial infarct [facility])    Ocular hypertension    Peripheral vascular disease (HCC)    Skin cancer    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HCC)    Sleep apnea    Vitreoretinal degeneration     Past Surgical History:  Procedure Laterality Date   CATARACT EXTRACTION  2011, 2012   COLONOSCOPY N/A 12/14/2018   Procedure: COLONOSCOPY;  Surgeon: Janalyn Keene NOVAK, MD;  Location: [facility] ENDOSCOPY;  Service: Endoscopy;  Laterality: N/A;   CORONARY ANGIOPLASTY WITH STENT PLACEMENT  12/10/2010   CORONARY STENT INTERVENTION N/A 12/29/2019   Procedure: CORONARY STENT INTERVENTION;  Surgeon: Mady Bruckner, MD;  Location: [facility] INVASIVE CV LAB;  Service: Cardiovascular;  Laterality: N/A;   ESOPHAGOGASTRODUODENOSCOPY N/A 12/14/2018   Procedure: ESOPHAGOGASTRODUODENOSCOPY (EGD);  Surgeon: Janalyn Keene NOVAK, MD;  Location: [facility] ENDOSCOPY;  Service: Endoscopy;  Laterality: N/A;   ESOPHAGOGASTRODUODENOSCOPY (EGD) WITH PROPOFOL  N/A 06/01/2016   Procedure: ESOPHAGOGASTRODUODENOSCOPY (EGD) WITH PROPOFOL ;  Surgeon: Lamar ONEIDA Holmes, MD;  Location: [facility] ENDOSCOPY;  Service: Endoscopy;  Laterality: N/A;   ESOPHAGOGASTRODUODENOSCOPY (EGD)  WITH PROPOFOL  N/A 01/31/2023   Procedure: ESOPHAGOGASTRODUODENOSCOPY (EGD) WITH PROPOFOL ;  Surgeon: Jinny Carmine, MD;  Location: [facility] ENDOSCOPY;  Service: Endoscopy;  Laterality: N/A;   FEMORAL ARTERY STENT Right    HERNIA REPAIR     x2   INTRAOCULAR LENS INSERTION     LEFT  HEART CATH AND CORONARY ANGIOGRAPHY N/A 05/09/2017   Procedure: Left Heart Cath and Coronary Angiography;  Surgeon: Florencio Cara BIRCH, MD;  Location: [facility] INVASIVE CV LAB;  Service: Cardiovascular;  Laterality: N/A;   LEFT HEART CATH AND CORONARY ANGIOGRAPHY N/A 12/08/2018   Procedure: LEFT HEART CATH AND CORONARY ANGIOGRAPHY and possible PCI and stent;  Surgeon: Florencio Cara BIRCH, MD;  Location: [facility] INVASIVE CV LAB;  Service: Cardiovascular;  Laterality: N/A;   LEFT HEART CATH AND CORONARY ANGIOGRAPHY N/A 12/29/2019   Procedure: LEFT HEART CATH AND CORONARY ANGIOGRAPHY;  Surgeon: Mady Bruckner, MD;  Location: [facility] INVASIVE CV LAB;  Service: Cardiovascular;  Laterality: N/A;   LEFT HEART CATH AND CORONARY ANGIOGRAPHY Left 12/13/2022   Procedure: LEFT HEART CATH AND CORONARY ANGIOGRAPHY;  Surgeon: Florencio Cara BIRCH, MD;  Location: [facility] INVASIVE CV LAB;  Service: Cardiovascular;  Laterality: Left;   NOSE SURGERY     submucous resection   PROSTATE SURGERY  12/10/2000   ROTATOR CUFF REPAIR Right     Medications Prior to Admission  Medication Sig Dispense Refill Last Dose/Taking   acetaminophen  (TYLENOL ) 500 MG tablet Take 1,000 mg by mouth every 8 (eight) hours as needed for mild pain.   07/01/2024   atorvastatin  (LIPITOR ) 80 MG tablet Take 1 tablet (80 mg total) by mouth daily. 30 tablet 0 07/01/2024 Morning   azelastine  (ASTELIN ) 0.1 % nasal spray USE 1 TO 2 SPRAYS IN EACH NOSTRIL TWICE A DAY   07/01/2024 Evening   calcitRIOL  (ROCALTROL ) 0.25 MCG capsule Take 0.25 mcg by mouth daily.   07/01/2024 Evening   citalopram  (CELEXA ) 10 MG tablet Take 1 tablet (10 mg total) by mouth daily. 90 tablet 3 07/01/2024 Morning   clopidogrel  (PLAVIX ) 75 MG tablet TAKE 1 TABLET BY MOUTH EVERY DAY 90 tablet 1 07/01/2024 at  7:15 AM   [employer] VITAMIN B12 1000 MCG tablet TAKE 1 TABLET BY MOUTH EVERY DAY 30 tablet 0 07/01/2024 Morning   diltiazem  (CARDIZEM ) 30 MG tablet Take 1 tablet (30 mg total) by mouth 3 (three) times  daily. Do not take this medication if SBP less than 110 and/or HR less than 60 90 tablet 0 07/01/2024 at  4:00 PM   docusate sodium  (COLACE) 100 MG capsule Take 100 mg by mouth 2 (two) times daily. (Patient taking differently: Take 200 mg by mouth daily.)   07/01/2024 Morning   EPINEPHrine  (EPI-PEN) 0.3 mg/0.3 mL SOAJ injection Inject into the muscle as needed (anaphylaxis).   Unknown   ergocalciferol  (VITAMIN D2) 1.25 MG (50000 UT) capsule Take 50,000 Units by mouth every 30 (thirty) days.   07/01/2024 Morning   ferrous sulfate  325 (65 FE) MG EC tablet TAKE 1 TABLET BY MOUTH EVERY DAY 90 tablet 3 07/01/2024 Morning   fluticasone  (FLONASE ) 50 MCG/ACT nasal spray USE 1 SPRAY IN EACH NOSTRIL EVERY DAY 32 g 6 07/01/2024   hydrALAZINE  (APRESOLINE ) 50 MG tablet Take 25 mg by mouth 2 (two) times daily.   07/01/2024 Evening   insulin  aspart (NOVOLOG ) 100 UNIT/ML FlexPen Inject 10-30 Units into the skin 3 (three) times daily with meals. Take 10 units before breakfast, 30 units before lunch and 30 units before supper.  07/01/2024 at  6:00 PM   insulin  glargine (LANTUS ) 100 UNIT/ML injection Inject 0.35 mLs (35 Units total) into the skin at bedtime. This is a decrease from your previous 45 units nightly. (Patient taking differently: Inject 60 Units into the skin at bedtime.) 10 mL 11 06/30/2024   isosorbide  mononitrate (IMDUR ) 120 MG 24 hr tablet Take 1 tablet (120 mg total) by mouth 2 (two) times daily. 60 tablet 0 07/01/2024 Morning   JARDIANCE 10 MG TABS tablet Take 10 mg by mouth daily.   07/01/2024 Morning   loratadine  (CLARITIN ) 10 MG tablet Take 10 mg by mouth daily.   07/01/2024 Morning   magnesium  oxide (MAG-OX) 400 (240 Mg) MG tablet Take 1 tablet by mouth daily.   06/30/2024   methocarbamol  (ROBAXIN ) 500 MG tablet Take 500 mg by mouth 2 (two) times daily.   07/01/2024 Evening   metolazone  (ZAROXOLYN ) 2.5 MG tablet Take 2.5 mg by mouth once a week.   06/27/2024   metoprolol  succinate (TOPROL -XL) 100 MG 24 hr  tablet Take 1 tablet (100 mg total) by mouth daily. Take with or immediately following a meal. 30 tablet 0 07/01/2024 Morning   mometasone  (ASMANEX ) 220 MCG/ACT inhaler Inhale 2 puffs into the lungs daily.   07/01/2024 Morning   montelukast  (SINGULAIR ) 10 MG tablet TAKE 1 TABLET AT BEDTIME 90 tablet 3 06/30/2024   nitroGLYCERIN  (NITROSTAT ) 0.4 MG SL tablet Place 0.4 mg under the tongue every 5 (five) minutes x 3 doses as needed for chest pain.   07/01/2024 Evening   pantoprazole  (PROTONIX ) 40 MG tablet Take 1 tablet (40 mg total) by mouth 2 (two) times daily before a meal.   07/01/2024 Evening   ranolazine  (RANEXA ) 500 MG 12 hr tablet Take 1 tablet by mouth 2 (two) times daily.   07/01/2024 Evening   Tiotropium Bromide-Olodaterol (STIOLTO RESPIMAT) 2.5-2.5 MCG/ACT AERS Inhale 1 each into the lungs daily.   07/01/2024 Morning   torsemide  (DEMADEX ) 20 MG tablet Take 40 mg by mouth 2 (two) times daily.   07/01/2024 Evening   Social History   Socioeconomic History   Marital status: Married    Spouse name: Not on file   Number of children: 2   Years of education: College 3 years   Highest education level: Associate degree: occupational, Scientist, product/process development, or vocational program  Occupational History   Occupation: retired  Tobacco Use   Smoking status: Former    Current packs/day: 0.00    Types: Cigarettes    Start date: 12/10/1956    Quit date: 12/10/1976    Years since quitting: 47.6   Smokeless tobacco: Never  Vaping Use   Vaping status: Never Used  Substance and Sexual Activity   Alcohol use: No   Drug use: No   Sexual activity: Not Currently  Other Topics Concern   Not on file  Social History Narrative   Not on file   Social Drivers of Health   Financial Resource Strain: Low Risk  (06/21/2023)   Received from [facility]   Overall Financial Resource Strain (CARDIA)    Difficulty of Paying Living Expenses: Not very hard  Food Insecurity: No Food Insecurity (07/03/2024)   Hunger  Vital Sign    Worried About Running Out of Food in the Last Year: Never true    Ran Out of Food in the Last Year: Never true  Transportation Needs: No Transportation Needs (07/03/2024)   PRAPARE - Administrator, Civil Service (Medical): No  Lack of Transportation (Non-Medical): No  Physical Activity: Insufficiently Active (09/15/2021)   Exercise Vital Sign    Days of Exercise per Week: 7 days    Minutes of Exercise per Session: 20 min  Stress: No Stress Concern Present (09/15/2021)   [employer] of Occupational Health - Occupational Stress Questionnaire    Feeling of Stress : Not at all  Social Connections: Moderately Isolated (07/03/2024)   Social Connection and Isolation Panel    Frequency of Communication with Friends and Family: Once a week    Frequency of Social Gatherings with Friends and Family: More than three times a week    Attends Religious Services: Never    Database administrator or Organizations: No    Attends Banker Meetings: Never    Marital Status: Married  Catering manager Violence: Not At Risk (07/03/2024)   Humiliation, Afraid, Rape, and Kick questionnaire    Fear of Current or Ex-Partner: No    Emotionally Abused: No    Physically Abused: No    Sexually Abused: No    Family History  Problem Relation Age of Onset   Arthritis Mother    Stroke Maternal Grandfather    Breast cancer Neg Hx      Vitals:   07/04/24 2317 07/05/24 0408 07/05/24 0500 07/05/24 0813  BP: 115/64 (!) 129/48  (!) 137/45  Pulse: 69 (!) 59  (!) 56  Resp: 20 20    Temp: 97.9 F (36.6 C) 98.3 F (36.8 C)  97.7 F (36.5 C)  TempSrc:      SpO2: 93% 98%  92%  Weight:   83.6 kg   Height:        PHYSICAL EXAM General: Well-appearing elderly male, well nourished, in no acute distress. HEENT: Normocephalic and atraumatic. Neck: No JVD.   Lungs: Normal respiratory effort on room air. Clear bilaterally to auscultation. No wheezes, crackles, rhonchi.   Heart: HRRR. Normal S1 and S2 without gallops or murmurs.  Abdomen: Non-distended appearing.  Msk: Normal strength and tone for age. Extremities: Warm and well perfused. No clubbing, cyanosis, edema.  Neuro: Alert and oriented X 3. Psych: Answers questions appropriately.   Labs: Basic Metabolic Panel: Recent Labs    07/03/24 0455 07/04/24 0433 07/04/24 1318  NA 137 133*  --   K 3.4* 3.9  --   CL 90* 91*  --   CO2 30 27  --   GLUCOSE 212* 221* 384*  BUN 97* 96*  --   CREATININE 4.12* 3.76*  --   CALCIUM  9.6 9.2  --   PHOS  --  4.7*  --    Liver Function Tests: Recent Labs    07/04/24 0433  ALBUMIN  3.5   No results for input(s): LIPASE, AMYLASE in the last 72 hours. CBC: Recent Labs    07/03/24 0455  WBC 9.4  HGB 12.2*  HCT 36.9*  MCV 94.1  PLT 221   Cardiac Enzymes: No results for input(s): CKTOTAL, CKMB, CKMBINDEX, TROPONINIHS in the last 72 hours.  BNP: No results for input(s): BNP in the last 72 hours.  D-Dimer: No results for input(s): DDIMER in the last 72 hours. Hemoglobin A1C: No results for input(s): HGBA1C in the last 72 hours. Fasting Lipid Panel: No results for input(s): CHOL, HDL, LDLCALC, TRIG, CHOLHDL, LDLDIRECT in the last 72 hours. Thyroid  Function Tests: No results for input(s): TSH, T4TOTAL, T3FREE, THYROIDAB in the last 72 hours.  Invalid input(s): FREET3 Anemia Panel: No results for input(s): VITAMINB12,  FOLATE, FERRITIN, TIBC, IRON , RETICCTPCT in the last 72 hours.   Radiology: CT HEAD WO CONTRAST ( ) Result Date: 07/04/2024 CLINICAL DATA:  syncopal even with fall EXAM: CT HEAD WITHOUT CONTRAST TECHNIQUE: Contiguous axial images were obtained from the base of the skull through the vertex without intravenous contrast. RADIATION DOSE REDUCTION: This exam was performed according to the departmental dose-optimization program which includes automated exposure control, adjustment of the  mA and/or kV according to patient size and/or use of iterative reconstruction technique. COMPARISON:  MRI October 08, 2015. FINDINGS: Brain: No evidence of acute infarction, hemorrhage, hydrocephalus, extra-axial collection or mass lesion/mass effect. Cerebral atrophy with mild prominence of the extra-axial spaces bilaterally. 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 Vascular: No hyperdense vessel.  Calcific atherosclerosis. Skull: No acute fracture. Sinuses/Orbits: Mild paranasal sinus mucosal thickening. No acute orbital findings. Other: No mastoid effusions. IMPRESSION: No evidence of acute intracranial abnormality. Electronically Signed   By: Gilmore GORMAN Molt M.D.   On: 07/04/2024 00:44   US  RENAL Result Date: 07/03/2024 CLINICAL DATA:  Acute kidney injury EXAM: RENAL / URINARY TRACT ULTRASOUND COMPLETE COMPARISON:  Ultrasound 10/30/2023 FINDINGS: Right Kidney: Renal measurements: 9.4 x 4.8 x 3.8 cm = volume: 88.4 mL. Cortex is slightly echogenic. No mass or hydronephrosis Left Kidney: Renal measurements: 9.7 x 5.2 x 4.5 cm = volume: 118.1 mL. Cortex is slightly echogenic. No mass or hydronephrosis Bladder: Appears normal for degree of bladder distention. Other: None. IMPRESSION: Negative for hydronephrosis. Slightly echogenic renal cortices as can be seen with medical renal disease. Electronically Signed   By: Luke Bun M.D.   On: 07/03/2024 20:10   DG ESOPHAGUS W SINGLE CM (SOL OR THIN BA) Result Date: 07/03/2024 CLINICAL DATA:  Provided history: Dysphagia. Additional history: The patient reports substernal chest pain when swallowing pills. History of esophageal dilation procedure. EXAM: ESOPHAGUS/BARIUM SWALLOW/TABLET STUDY TECHNIQUE: A single contrast examination was performed using thin liquid barium. Additionally, the patient swallowed a 13 mm barium tablet under fluoroscopy. The exam was performed by Carlin Griffon, PA-C, and was supervised and interpreted by Dr. Rockey Childs  FLUOROSCOPY: Radiation Exposure Index (as provided by the fluoroscopic device): 53.40 mGy Kerma COMPARISON:  Upper GI series 07/23/2023. Chest CT 07/02/2024. FINDINGS: Limited examination due to the patient's inability to stand and limited ability to reposition on the fluoroscopy table. Redemonstrated small Zenker diverticulum. Also similar to the prior upper GI series of 07/23/2023, there is smooth focal narrowing at the level of distal esophagus. A swallowed 13 mm barium tablet did not pass beyond this level despite a prolonged period of observation, and despite the patient taking additional swallows of water and barium contrast. Esophagus normal in caliber and smooth in contour elsewhere. Prominent intermittent esophageal dysmotility with tertiary contractions. Small to moderate-sized hiatal hernia, unchanged. No gastroesophageal reflux observed. IMPRESSION: 1. Limited examination as described. 2. Similar to the prior upper GI series of 07/23/2023, there is smooth focal narrowing at the level of distal esophagus. A swallowed 13 mm barium tablet did not pass beyond this level despite a prolonged period of observation, and despite the patient taking additional swallows of water and barium contrast. 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 3. Prominent esophageal dysmotility with tertiary contractions. 4. Known small Zenker diverticulum. 5. Small to moderate-sized hiatal hernia, unchanged. Electronically Signed   By: Rockey Childs D.O.   On: 07/03/2024 16:21   CT Chest Wo Contrast Result Date: 07/02/2024 EXAM: CT CHEST WITHOUT CONTRAST 07/02/2024 12:43:41 AM TECHNIQUE:  CT of the chest was performed without the administration of intravenous contrast. Multiplanar reformatted images are provided for review. Automated exposure control, iterative reconstruction, and/or weight based adjustment of the mA/kV was utilized to reduce the radiation dose to as low as reasonably achievable. COMPARISON:  04/22/2023 CLINICAL HISTORY: Diffuse/interstitial lung disease. Per ed notes; Pt arrives via ems from home c/o pressure-like CP w/ tingling to left arm w/ sob that started 1 hour pta. Pt administered 324 ASA and 3 nitros prior to ems arrival w/out relief. Ems administered 1 nitroglycerin -spray en route, pt denied relief. Pt also reports dizziness all day. Pt on plavix . FINDINGS: MEDIASTINUM: Mild cardiomegaly. Thoracic aortic atherosclerosis, including the aortic root/valve. LYMPH NODES: No mediastinal, hilar or axillary lymphadenopathy. LUNGS AND PLEURA: Mild subpleural reticulation/fibrosis, lower lobe predominant, suggesting stable mild chronic lung disease. Scattered calcified granulomata, benign. No focal consolidation or pulmonary edema. No pleural effusion or pneumothorax. SOFT TISSUES/BONES: No acute abnormality of the bones or soft tissues. UPPER ABDOMEN: Limited images of the upper abdomen demonstrates no acute abnormality. IMPRESSION: 1. No acute cardiopulmonary abnormality. 2. Stable mild chronic lung disease. Electronically signed by: Pinkie Pebbles MD 07/02/2024 12:51 AM EDT RP Workstation: [id number]   DG Chest 1 View Result Date: 07/01/2024 CLINICAL DATA:  Pressure-like chest pain. EXAM: CHEST  1 VIEW COMPARISON:  Apr 26, 2024 FINDINGS: The heart size and mediastinal contours are within normal limits. Both lungs are clear. The visualized skeletal structures are unremarkable. IMPRESSION: No active disease. Electronically Signed   By: Suzen Dials M.D.   On: 07/01/2024 23:43    ECHO 04/24/24  1. Left ventricular ejection fraction, by estimation, is 60 to 65%. The left ventricle has normal function. The left ventricle has no regional wall motion abnormalities. Left ventricular diastolic parameters are consistent with Grade II diastolic dysfunction (pseudonormalization).   2. Right ventricular systolic function is normal. The right ventricular size is normal.   3. Left atrial size was  mildly dilated.   4. The mitral valve is normal in structure. Mild mitral valve regurgitation. No evidence of mitral stenosis.   5. The aortic valve is calcified. Aortic valve regurgitation is not visualized. Mild to moderate aortic valve stenosis. Aortic valve area, by  VTI measures 1.20 cm. Aortic valve mean gradient measures 25.3 mmHg. Aortic valve Vmax measures 3.16 m/s.   6. The inferior vena cava is normal in size with greater than 50% respiratory variability, suggesting right atrial pressure of 3 mmHg.   TELEMETRY reviewed by me 07/05/2024: Sinus rhythm, rate 50s.  EKG reviewed by me: sinus rhythm, rate 63 bpm without acute ischemic changes  Data reviewed by me 07/05/2024: last 24h vitals tele labs imaging I/O hospitalist progress notes.  Principal Problem:   Unstable angina (HCC) Active Problems:   Essential hypertension   Nonspecific chest pain   Orthopnea   Acute on chronic diastolic CHF (congestive heart failure) (HCC)   CKD (chronic kidney disease), stage IV (HCC)   Depression   Dysphagia   Acute kidney injury superimposed on chronic kidney disease (HCC)   Dyslipidemia   Type 2 diabetes mellitus without complication, with long-term current use of insulin  (HCC)   Chronic GERD without esophagitis   Syncope    ASSESSMENT AND PLAN:  Mike Decker is a 83 y.o. male  with a past medical history of CAD s/p PCI w/ DES RCA, prox LCx, jailed OM 2 but good flow, hypertension, hyperlipidemia chronic HFpEF, mild to moderate AS, CKD 4, type 2 diabetes, carotid stenosis,  COPD, OSA on CPAP, Barrett's esophagus, history of prostate cancer , who presented to the ED on 07/01/2024 for chest pain that radiates to left arm, associated with nausea, dyspnea, abdominal distention.  Patient has gained 6 pounds in the past 3 days.  Cardiology was consulted for further evaluation.   # Intermittent dizziness # Syncope -Cardiac monitor placed to further evaluate. -Suspect overnight episode 7/26  related to hypotension.   # Chronic HFpEF # Acute on CKD Patient with without SOB or LEE.   BNP within normal limits at 82.  CXR and CT chest without acute cardiopulmonary disease.  Appears euvolemic on exam. - Hold home torsemide  40 mg twice daily per nephrology. Resume when recommended by nephrology. - Hold home metolazone  2.5 mg once weekly per nephrology. (Nephrology recommend holding diuretics for 1-2 days due to worsening Cr - baseline Cr is 2.8-3.1) - Continue home metoprolol  succinate 100 mg daily. - Will resume home Jardiance when renal function stabilizes. - Patient not a candidate for spironolactone, GFR < 30 - Nephrology consulted for worsening renal disease, appreciate recommendations.   # Chronic Angina # Microvascular disease # CAD s/p multiple stents # Hypertension # Hyperlipidemia # Likely demand ischemia with uncontrolled BP Trops mildly elevated and flat 43 > 51 is most consistent with demand ischemia with uncontrolled BP.  EKG with sinus rhythm, rate 63 bpm without acute ischemic changes.  And is currently chest pain-free.  Echo from 04/2024 with preserved EF, no RWMA, grade 2 diastolic dysfunction. - Continue home Imdur  120 mg BID. - Continue metoprolol  as stated above. - Continue home diltiazem  30 mg 3 times daily. - Continue home Ranexa  500 mg twice daily. - Continue home Plavix  75 mg daily, atorvastatin  80 mg daily. - No plan for invasive cardiac diagnostic at this time as patient's renal function is significantly worse compared to 1 month ago. Patient has chronic angina and microvascular disease that is contributing to his anginal symptoms, will continue to manage with maximizing antianginal medications.  Cardiology will sign off. Please haiku with questions or re-engage if needed.    This patient's plan of care was discussed and created with Dr. Florencio and he is in agreement.  Signed: Danita Bloch, PA-C  07/05/2024, 8:45 AM [facility] Cardiology

## 2024-07-07 ENCOUNTER — Emergency Department

## 2024-07-07 ENCOUNTER — Other Ambulatory Visit: Payer: Self-pay

## 2024-07-07 ENCOUNTER — Emergency Department
Admission: EM | Admit: 2024-07-07 | Discharge: 2024-07-07 | Disposition: A | Discharge status: Home / Self Care | Attending: Emergency Medicine | Admitting: Emergency Medicine

## 2024-07-07 DIAGNOSIS — I509 Heart failure, unspecified: Secondary | ICD-10-CM | POA: Insufficient documentation

## 2024-07-07 DIAGNOSIS — R0902 Hypoxemia: Secondary | ICD-10-CM | POA: Diagnosis not present

## 2024-07-07 DIAGNOSIS — R0789 Other chest pain: Secondary | ICD-10-CM | POA: Diagnosis present

## 2024-07-07 DIAGNOSIS — I2510 Atherosclerotic heart disease of native coronary artery without angina pectoris: Secondary | ICD-10-CM | POA: Diagnosis not present

## 2024-07-07 DIAGNOSIS — R739 Hyperglycemia, unspecified: Secondary | ICD-10-CM | POA: Diagnosis not present

## 2024-07-07 DIAGNOSIS — R079 Chest pain, unspecified: Secondary | ICD-10-CM | POA: Diagnosis not present

## 2024-07-07 DIAGNOSIS — R0602 Shortness of breath: Secondary | ICD-10-CM | POA: Diagnosis not present

## 2024-07-07 DIAGNOSIS — N189 Chronic kidney disease, unspecified: Secondary | ICD-10-CM | POA: Insufficient documentation

## 2024-07-07 DIAGNOSIS — I110 Hypertensive heart disease with heart failure: Secondary | ICD-10-CM | POA: Diagnosis not present

## 2024-07-07 LAB — BASIC METABOLIC PANEL WITH GFR
Anion gap: 11 (ref 5–15)
BUN: 50 mg/dL — ABNORMAL HIGH (ref 8–23)
CO2: 25 mmol/L (ref 22–32)
Calcium: 8.8 mg/dL — ABNORMAL LOW (ref 8.9–10.3)
Chloride: 97 mmol/L — ABNORMAL LOW (ref 98–111)
Creatinine, Ser: 2.51 mg/dL — ABNORMAL HIGH (ref 0.61–1.24)
GFR, Estimated: 25 mL/min — ABNORMAL LOW (ref >60)
Glucose, Bld: 341 mg/dL — ABNORMAL HIGH (ref 70–99)
Potassium: 5 mmol/L (ref 3.5–5.1)
Sodium: 133 mmol/L — ABNORMAL LOW (ref 135–145)

## 2024-07-07 LAB — TROPONIN I (HIGH SENSITIVITY)
Troponin I (High Sensitivity): 24 ng/L — ABNORMAL HIGH (ref <18)
Troponin I (High Sensitivity): 29 ng/L — ABNORMAL HIGH (ref <18)

## 2024-07-07 LAB — CBC
HCT: 34.3 % — ABNORMAL LOW (ref 39.0–52.0)
Hemoglobin: 11.3 g/dL — ABNORMAL LOW (ref 13.0–17.0)
MCH: 32 pg (ref 26.0–34.0)
MCHC: 32.9 g/dL (ref 30.0–36.0)
MCV: 97.2 fL (ref 80.0–100.0)
Platelets: 202 K/uL (ref 150–400)
RBC: 3.53 MIL/uL — ABNORMAL LOW (ref 4.22–5.81)
RDW: 13.2 % (ref 11.5–15.5)
WBC: 9 K/uL (ref 4.0–10.5)
nRBC: 0 % (ref 0.0–0.2)

## 2024-07-07 LAB — BRAIN NATRIURETIC PEPTIDE: B Natriuretic Peptide: 551.4 pg/mL — ABNORMAL HIGH (ref 0.0–100.0)

## 2024-07-07 MED ORDER — LIDOCAINE VISCOUS HCL 2 % MT SOLN
15.0000 mL | Freq: Once | OROMUCOSAL | Status: AC
  Administered 2024-07-07: 15 mL via OROMUCOSAL
  Filled 2024-07-07: qty 15

## 2024-07-07 MED ORDER — FUROSEMIDE 10 MG/ML IJ SOLN
60.0000 mg | Freq: Once | INTRAMUSCULAR | Status: AC
  Administered 2024-07-07: 60 mg via INTRAVENOUS
  Filled 2024-07-07: qty 8

## 2024-07-07 MED ORDER — TORSEMIDE 20 MG PO TABS
40.0000 mg | ORAL_TABLET | Freq: Once | ORAL | Status: AC
  Administered 2024-07-07: 40 mg via ORAL
  Filled 2024-07-07: qty 2

## 2024-07-07 MED ORDER — ALUM & MAG HYDROXIDE-SIMETH 200-200-20 MG/5ML PO SUSP
30.0000 mL | Freq: Once | ORAL | Status: AC
  Administered 2024-07-07: 30 mL via ORAL
  Filled 2024-07-07: qty 30

## 2024-07-07 NOTE — ED Triage Notes (Signed)
 First Nurse Note: Patient to ED via [facility] from home for CP- suddenly onset at 48. PT taken 3 nitroglycerin  and 2 325 ASA with no relief at home. 8/10 pain that radiates into left arm. Hx of stents, CHF (has not been taking fluid pill recently until yesterday.) Having nausea.    170/80 78 HR 95% 2L [place] 250 cbg  20 L AC

## 2024-07-07 NOTE — ED Triage Notes (Addendum)
 See First RN note.  Pt c/o intermittent dizziness, SOB w/ exertion, and central chest pain radiating into L arm starting this morning.  Pain score 8/10. Pt was discharged x2 days ago after being admitted for the same.   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

## 2024-07-07 NOTE — ED Provider Notes (Signed)
 [facility] Provider Note    Event Date/Time   First MD Initiated Contact with Patient 07/07/24 [phone]     (approximate)   History   Chest Pain and Shortness of Breath   HPI  Mike Decker is a 83 y.o. male who presents to the ED for evaluation of Chest Pain and Shortness of Breath   I reviewed medical DC summary from 2 days ago where patient was admitted for unstable angina.  History of CKD, diastolic CHF, mild to moderate AAS, CAD.  Esophagram with some narrowing in the plan for outpatient dilation, requiring Plavix  held for 5 days prior to the procedure.  Cardiology signed off, likely GI etiology of pain.  Patient presents to the ED for evaluation of dyspnea on exertion and continued chest pain.  He has been holding his diuretics at the direction of his discharging hospitalist a couple days ago.  Also holding his Plavix  with a planned EGD on Thursday for dilation.   Physical Exam   Triage Vital Signs: ED Triage Vitals  Encounter Vitals Group     BP 07/07/24 0728 (!) 171/65     Girls Systolic BP Percentile --      Girls Diastolic BP Percentile --      Boys Systolic BP Percentile --      Boys Diastolic BP Percentile --      Pulse Rate 07/07/24 0728 71     Resp 07/07/24 0728 (!) 22     Temp 07/07/24 0728 98.1 F (36.7 C)     Temp Source 07/07/24 0728 Oral     SpO2 07/07/24 0728 93 %     Weight 07/07/24 0729 184 lb (83.5 kg)     Height 07/07/24 0729 5' 6 (1.676 m)     Head Circumference --      Peak Flow --      Pain Score 07/07/24 0729 8     Pain Loc --      Pain Education --      Exclude from Growth Chart --     Most recent vital signs: Vitals:   07/07/24 0728  BP: (!) 171/65  Pulse: 71  Resp: (!) 22  Temp: 98.1 F (36.7 C)  SpO2: 93%    General: Awake, no distress.  CV:  Good peripheral perfusion.  Resp:  Normal effort.  Abd:  No distention.  MSK:  No deformity noted.  Neuro:  No focal deficits  appreciated. Other:     ED Results / Procedures / Treatments   Labs (all labs ordered are listed, but only abnormal results are displayed) Labs Reviewed  BASIC METABOLIC PANEL WITH GFR - Abnormal; Notable for the following components:      Result Value   Sodium 133 (*)    Chloride 97 (*)    Glucose, Bld 341 (*)    BUN 50 (*)    Creatinine, Ser 2.51 (*)    Calcium  8.8 (*)    GFR, Estimated 25 (*)    All other components within normal limits  CBC - Abnormal; Notable for the following components:   RBC 3.53 (*)    Hemoglobin 11.3 (*)    HCT 34.3 (*)    All other components within normal limits  BRAIN NATRIURETIC PEPTIDE - Abnormal; Notable for the following components:   B Natriuretic Peptide 551.4 (*)    All other components within normal limits  TROPONIN I (HIGH SENSITIVITY) - Abnormal; Notable for the following components:  Troponin I (High Sensitivity) 24 (*)    All other components within normal limits  TROPONIN I (HIGH SENSITIVITY)    EKG Sinus rhythm with a rate of 70 bpm.  First-degree AV block without high-grade block.  No clear signs of acute ischemia.  RADIOLOGY CXR interpreted by me without evidence of acute cardiopulmonary pathology.  Official radiology report(s): DG Chest 2 View Result Date: 07/07/2024 CLINICAL DATA:  Chest pain, short of breath EXAM: CHEST - 2 VIEW COMPARISON:  Chest CT 07/02/2024 FINDINGS: Normal cardiac silhouette. Cardiac according device in the is soft tissues of the anterior chest. Normal mediastinum and cardiac silhouette. Normal pulmonary vasculature. No evidence of effusion, infiltrate, or pneumothorax. No acute bony abnormality. IMPRESSION: No acute cardiopulmonary process. Electronically Signed   By: Jackquline Boxer M.D.   On: 07/07/2024 08:58    PROCEDURES and INTERVENTIONS:  .1-3 Lead EKG Interpretation  Performed by: Claudene Rover, MD Authorized by: Claudene Rover, MD     Interpretation: normal     ECG rate:  72   ECG rate  assessment: normal     Rhythm: sinus rhythm     Ectopy: none     Conduction: normal     Medications - No data to display   IMPRESSION / MDM / ASSESSMENT AND PLAN / ED COURSE  I reviewed the triage vital signs and the nursing notes.  Differential diagnosis includes, but is not limited to, ACS, PTX, PNA, muscle strain/spasm, PE, dissection, anxiety, pleural effusion  {Patient presents with symptoms of an acute illness or injury that is potentially life-threatening.  Patient with a chronic degree of chest discomfort of a possible GI etiology presents with continued chest pain and dyspnea on exertion after his diuretics were recently held.  His renal function has improved well but he appears volume overloaded now and also has a elevated BNP.  Initiate IV and oral diuresis in the ED with good effect and good urine output.  Troponins are flat and chronically elevated.  No hematologic derangements.  I considered readmission for this patient but believe he be suitable for outpatient management.  Clinical Course as of 07/07/24 1525  Tue Jul 07, 2024  0921 Paused diuretics [DS]  1135 Reassessed.  No urine output yet. [DS]  1216 Reassessed and discussed plan of care.  He is Adult nurse. [DS]  1511 Reassessed.  Patient is put out greater than 1600 cc of urine and reports feeling okay.  No hypoxia.  We discussed plan of care and he is comfortable going home, restarting his torsemide .  He has an upcoming endoscopy scheduled for Thursday as an outpatient for dilation with Dr. Onita.  We discussed appropriate ED return precautions. [DS]    Clinical Course User Index [DS] Claudene Rover, MD     FINAL CLINICAL IMPRESSION(S) / ED DIAGNOSES   Final diagnoses:  None     Rx / DC Orders   ED Discharge Orders     None        Note:  This document was prepared using Dragon voice recognition software and may include unintentional dictation errors.   Claudene Rover, MD 07/07/24 [phone]

## 2024-07-07 NOTE — ED Notes (Addendum)
 Pt reports he had cp yesterday around 1830. Took 3 nitros. Experience SOB throughout the night that would go away when he would be still. Around 0545 was SOB and CP, and took 3 nitros and 2 Asprin. Pt scheduled for endo 7/31 for esophagus. Pt reports Hx of left, right, and femoral stent, and aortic stenosis. Reports 8/10 CP feeling like pressure at the moment.

## 2024-07-07 NOTE — Discharge Instructions (Signed)
 As we discussed, please restart your torsemide  diuretic and take twice daily as you previously had.  Do not take your metolazone   Continue to hold your Plavix  until you see Dr. Onita on Thursday for your endoscopy  Return to the ED with any worsening symptoms despite these measures

## 2024-07-09 ENCOUNTER — Encounter: Payer: Self-pay | Admitting: Gastroenterology

## 2024-07-09 ENCOUNTER — Ambulatory Visit
Admission: RE | Admit: 2024-07-09 | Discharge: 2024-07-09 | Disposition: A | Discharge status: Home / Self Care | Attending: Gastroenterology | Admitting: Gastroenterology

## 2024-07-09 ENCOUNTER — Ambulatory Visit: Admitting: Anesthesiology

## 2024-07-09 ENCOUNTER — Other Ambulatory Visit: Payer: Self-pay

## 2024-07-09 ENCOUNTER — Encounter
Admission: RE | Disposition: A | Discharge status: Home / Self Care | Payer: Self-pay | Source: Home / Self Care | Attending: Gastroenterology

## 2024-07-09 DIAGNOSIS — I252 Old myocardial infarction: Secondary | ICD-10-CM | POA: Diagnosis not present

## 2024-07-09 DIAGNOSIS — J449 Chronic obstructive pulmonary disease, unspecified: Secondary | ICD-10-CM | POA: Insufficient documentation

## 2024-07-09 DIAGNOSIS — I5032 Chronic diastolic (congestive) heart failure: Secondary | ICD-10-CM | POA: Diagnosis not present

## 2024-07-09 DIAGNOSIS — D649 Anemia, unspecified: Secondary | ICD-10-CM | POA: Insufficient documentation

## 2024-07-09 DIAGNOSIS — Z7902 Long term (current) use of antithrombotics/antiplatelets: Secondary | ICD-10-CM | POA: Insufficient documentation

## 2024-07-09 DIAGNOSIS — K225 Diverticulum of esophagus, acquired: Secondary | ICD-10-CM | POA: Diagnosis not present

## 2024-07-09 DIAGNOSIS — Z79899 Other long term (current) drug therapy: Secondary | ICD-10-CM | POA: Insufficient documentation

## 2024-07-09 DIAGNOSIS — R1310 Dysphagia, unspecified: Secondary | ICD-10-CM | POA: Insufficient documentation

## 2024-07-09 DIAGNOSIS — Z7951 Long term (current) use of inhaled steroids: Secondary | ICD-10-CM | POA: Diagnosis not present

## 2024-07-09 DIAGNOSIS — R0609 Other forms of dyspnea: Secondary | ICD-10-CM | POA: Diagnosis not present

## 2024-07-09 DIAGNOSIS — Z87891 Personal history of nicotine dependence: Secondary | ICD-10-CM | POA: Diagnosis not present

## 2024-07-09 DIAGNOSIS — E66813 Obesity, class 3: Secondary | ICD-10-CM | POA: Diagnosis not present

## 2024-07-09 DIAGNOSIS — G4730 Sleep apnea, unspecified: Secondary | ICD-10-CM | POA: Diagnosis not present

## 2024-07-09 DIAGNOSIS — K222 Esophageal obstruction: Secondary | ICD-10-CM | POA: Diagnosis not present

## 2024-07-09 DIAGNOSIS — K2289 Other specified disease of esophagus: Secondary | ICD-10-CM | POA: Diagnosis not present

## 2024-07-09 DIAGNOSIS — Z794 Long term (current) use of insulin: Secondary | ICD-10-CM | POA: Insufficient documentation

## 2024-07-09 DIAGNOSIS — F32A Depression, unspecified: Secondary | ICD-10-CM | POA: Diagnosis not present

## 2024-07-09 DIAGNOSIS — I110 Hypertensive heart disease with heart failure: Secondary | ICD-10-CM | POA: Diagnosis not present

## 2024-07-09 DIAGNOSIS — Z6828 Body mass index (BMI) 28.0-28.9, adult: Secondary | ICD-10-CM | POA: Diagnosis not present

## 2024-07-09 DIAGNOSIS — K224 Dyskinesia of esophagus: Secondary | ICD-10-CM | POA: Diagnosis not present

## 2024-07-09 DIAGNOSIS — I130 Hypertensive heart and chronic kidney disease with heart failure and stage 1 through stage 4 chronic kidney disease, or unspecified chronic kidney disease: Secondary | ICD-10-CM | POA: Diagnosis not present

## 2024-07-09 DIAGNOSIS — N184 Chronic kidney disease, stage 4 (severe): Secondary | ICD-10-CM | POA: Diagnosis not present

## 2024-07-09 DIAGNOSIS — K449 Diaphragmatic hernia without obstruction or gangrene: Secondary | ICD-10-CM | POA: Diagnosis not present

## 2024-07-09 DIAGNOSIS — K2950 Unspecified chronic gastritis without bleeding: Secondary | ICD-10-CM | POA: Diagnosis not present

## 2024-07-09 DIAGNOSIS — I509 Heart failure, unspecified: Secondary | ICD-10-CM | POA: Insufficient documentation

## 2024-07-09 DIAGNOSIS — D631 Anemia in chronic kidney disease: Secondary | ICD-10-CM | POA: Diagnosis not present

## 2024-07-09 DIAGNOSIS — K219 Gastro-esophageal reflux disease without esophagitis: Secondary | ICD-10-CM | POA: Insufficient documentation

## 2024-07-09 DIAGNOSIS — E785 Hyperlipidemia, unspecified: Secondary | ICD-10-CM | POA: Insufficient documentation

## 2024-07-09 DIAGNOSIS — K2270 Barrett's esophagus without dysplasia: Secondary | ICD-10-CM | POA: Diagnosis not present

## 2024-07-09 DIAGNOSIS — I25119 Atherosclerotic heart disease of native coronary artery with unspecified angina pectoris: Secondary | ICD-10-CM | POA: Diagnosis not present

## 2024-07-09 DIAGNOSIS — E1151 Type 2 diabetes mellitus with diabetic peripheral angiopathy without gangrene: Secondary | ICD-10-CM | POA: Diagnosis not present

## 2024-07-09 DIAGNOSIS — I5033 Acute on chronic diastolic (congestive) heart failure: Secondary | ICD-10-CM | POA: Diagnosis not present

## 2024-07-09 DIAGNOSIS — E1122 Type 2 diabetes mellitus with diabetic chronic kidney disease: Secondary | ICD-10-CM | POA: Diagnosis not present

## 2024-07-09 LAB — GLUCOSE, CAPILLARY: Glucose-Capillary: 100 mg/dL — ABNORMAL HIGH (ref 70–99)

## 2024-07-09 SURGERY — EGD (ESOPHAGOGASTRODUODENOSCOPY)
Anesthesia: General

## 2024-07-09 MED ORDER — DEXMEDETOMIDINE HCL IN NACL 80 MCG/20ML IV SOLN
INTRAVENOUS | Status: DC | PRN
  Administered 2024-07-09: 12 ug via INTRAVENOUS
  Administered 2024-07-09: 8 ug via INTRAVENOUS

## 2024-07-09 MED ORDER — PROPOFOL 10 MG/ML IV BOLUS
INTRAVENOUS | Status: DC | PRN
  Administered 2024-07-09: 20 mg via INTRAVENOUS
  Administered 2024-07-09 (×2): 40 mg via INTRAVENOUS

## 2024-07-09 MED ORDER — PHENYLEPHRINE 80 MCG/ML (10ML) SYRINGE FOR IV PUSH (FOR BLOOD PRESSURE SUPPORT)
PREFILLED_SYRINGE | INTRAVENOUS | Status: AC
  Filled 2024-07-09: qty 10

## 2024-07-09 MED ORDER — LIDOCAINE HCL (PF) 2 % IJ SOLN
INTRAMUSCULAR | Status: AC
  Filled 2024-07-09: qty 5

## 2024-07-09 MED ORDER — LIDOCAINE HCL (CARDIAC) PF 100 MG/5ML IV SOSY
PREFILLED_SYRINGE | INTRAVENOUS | Status: DC | PRN
  Administered 2024-07-09: 80 mg via INTRAVENOUS

## 2024-07-09 MED ORDER — PHENYLEPHRINE 80 MCG/ML (10ML) SYRINGE FOR IV PUSH (FOR BLOOD PRESSURE SUPPORT)
PREFILLED_SYRINGE | INTRAVENOUS | Status: DC | PRN
  Administered 2024-07-09: 80 ug via INTRAVENOUS

## 2024-07-09 MED ORDER — PROPOFOL 500 MG/50ML IV EMUL
INTRAVENOUS | Status: DC | PRN
  Administered 2024-07-09: 50 ug/kg/min via INTRAVENOUS

## 2024-07-09 MED ORDER — SODIUM CHLORIDE 0.9 % IV SOLN: INTRAVENOUS | Status: DC

## 2024-07-09 NOTE — Transfer of Care (Signed)
 Immediate Anesthesia Transfer of Care Note  Patient: Mike Decker  Procedure(s) Performed: EGD (ESOPHAGOGASTRODUODENOSCOPY) Balloon dilation wire-guided  Patient Location: PACU  Anesthesia Type:General  Level of Consciousness: sedated  Airway & Oxygen Therapy: Patient Spontanous Breathing  Post-op Assessment: Report given to RN and Post -op Vital signs reviewed and stable  Post vital signs: Reviewed and stable  Last Vitals:  Vitals Value Taken Time  BP    Temp    Pulse 80 07/09/24 09:42  Resp 11 07/09/24 09:42  SpO2 98 % 07/09/24 09:42  Vitals shown include unfiled device data.  Last Pain:  Vitals:   07/09/24 0833  TempSrc: Temporal  PainSc: 0-No pain         Complications: No notable events documented.

## 2024-07-09 NOTE — Anesthesia Postprocedure Evaluation (Signed)
 Anesthesia Post Note  Patient: Mike Decker  Procedure(s) Performed: EGD (ESOPHAGOGASTRODUODENOSCOPY) Balloon dilation wire-guided  Patient location during evaluation: PACU Anesthesia Type: General Level of consciousness: awake Pain management: satisfactory to patient Vital Signs Assessment: post-procedure vital signs reviewed and stable Respiratory status: spontaneous breathing Cardiovascular status: stable Anesthetic complications: no   No notable events documented.   Last Vitals:  Vitals:   07/09/24 0954 07/09/24 1003  BP: (!) 101/54 100/78  Pulse: 78 74  Resp: 20 18  Temp:    SpO2: 97% 97%    Last Pain:  Vitals:   07/09/24 1003  TempSrc:   PainSc: 0-No pain                 VAN STAVEREN,Gordana Kewley

## 2024-07-09 NOTE — H&P (Signed)
 Pre-Procedure H&P   Patient ID: Mike Decker is a 83 y.o. male.  Gastroenterology Provider: [name], DO  PCP: Fernand Sigrid HERO, MD  Date: 07/09/2024  HPI [name] is a 83 y.o. male who presents today for Esophagogastroduodenoscopy for Esophageal stricture, dysphagia   Abnormal barium swallow during his hospitalization demonstrating esophageal narrowing. (See below)  He has a history of esophageal adenocarcinoma status post ablation.  He last underwent surveillance in 2022.  He did undergo an EGD in 2024 with dilation to 15 mm TTS balloon.  Stays on PPI to control his reflux.  Last dose of Plavix  7/25  Esophagram 07/03/24 Redemonstrated small Zenker diverticulum.   Also similar to the prior upper GI series of 07/23/2023, there is smooth focal narrowing at the level of distal esophagus. A swallowed 13 mm barium tablet did not pass beyond this level despite a prolonged period of observation, and despite the patient taking additional swallows of water and barium contrast.   Esophagus normal in caliber and smooth in contour elsewhere.   Prominent intermittent esophageal dysmotility with tertiary contractions.   Small to moderate-sized hiatal hernia, unchanged.   .   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, esphageal   Chronic diastolic CHF (congestive heart failure) (HCC)    Colon polyp    Diabetes mellitus without complication (HCC)    Diverticulosis    Gout    Heart disease    Hemangioma    liver   Hyperlipidemia    Hypertension    Myocardial infarct [facility])    Ocular hypertension    Peripheral vascular disease (HCC)    Skin cancer    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HCC)    Sleep apnea    Vitreoretinal degeneration     Past Surgical History:  Procedure Laterality Date   CATARACT EXTRACTION   2011, 2012   COLONOSCOPY N/A 12/14/2018   Procedure: COLONOSCOPY;  Surgeon: Janalyn Keene NOVAK, MD;  Location: [facility] ENDOSCOPY;  Service: Endoscopy;  Laterality: N/A;   CORONARY ANGIOPLASTY WITH STENT PLACEMENT  12/10/2010   CORONARY STENT INTERVENTION N/A 12/29/2019   Procedure: CORONARY STENT INTERVENTION;  Surgeon: Mady Bruckner, MD;  Location: [facility] INVASIVE CV LAB;  Service: Cardiovascular;  Laterality: N/A;   ESOPHAGOGASTRODUODENOSCOPY N/A 12/14/2018   Procedure: ESOPHAGOGASTRODUODENOSCOPY (EGD);  Surgeon: Janalyn Keene NOVAK, MD;  Location: [facility] ENDOSCOPY;  Service: Endoscopy;  Laterality: N/A;   ESOPHAGOGASTRODUODENOSCOPY (EGD) WITH PROPOFOL  N/A 06/01/2016   Procedure: ESOPHAGOGASTRODUODENOSCOPY (EGD) WITH PROPOFOL ;  Surgeon: Lamar ONEIDA Holmes, MD;  Location[facility] ENDOSCOPY;  Service: Endoscopy;  Laterality: N/A;   ESOPHAGOGASTRODUODENOSCOPY (EGD) WITH PROPOFOL  N/A 01/31/2023   Procedure: ESOPHAGOGASTRODUODENOSCOPY (EGD) WITH PROPOFOL ;  Surgeon: Jinny Carmine, MD;  Location: [facility] ENDOSCOPY;  Service: Endoscopy;  Laterality: N/A;   FEMORAL ARTERY STENT Right    HERNIA REPAIR     x2   INTRAOCULAR LENS INSERTION     LEFT HEART CATH AND CORONARY ANGIOGRAPHY N/A 05/09/2017   Procedure: Left Heart Cath and Coronary Angiography;  Surgeon: Florencio Cara BIRCH, MD;  Location: [facility] INVASIVE CV LAB;  Service: Cardiovascular;  Laterality: N/A;   LEFT HEART CATH AND CORONARY ANGIOGRAPHY N/A 12/08/2018   Procedure: LEFT HEART CATH AND CORONARY ANGIOGRAPHY and possible PCI and stent;  Surgeon: Florencio Cara BIRCH, MD;  Location: [facility] INVASIVE  CV LAB;  Service: Cardiovascular;  Laterality: N/A;   LEFT HEART CATH AND CORONARY ANGIOGRAPHY N/A 12/29/2019   Procedure: LEFT HEART CATH AND CORONARY ANGIOGRAPHY;  Surgeon: Mady Bruckner, MD;  Location: [facility] INVASIVE CV LAB;  Service: Cardiovascular;  Laterality: N/A;   LEFT HEART CATH AND CORONARY ANGIOGRAPHY Left 12/13/2022   Procedure: LEFT HEART CATH AND  CORONARY ANGIOGRAPHY;  Surgeon: Florencio Cara BIRCH, MD;  Location: [facility] INVASIVE CV LAB;  Service: Cardiovascular;  Laterality: Left;   NOSE SURGERY     submucous resection   PROSTATE SURGERY  12/10/2000   ROTATOR CUFF REPAIR Right     Family History No h/o GI disease or malignancy  Review of Systems  Constitutional:  Negative for activity change, appetite change, chills, diaphoresis, fatigue, fever and unexpected weight change.  HENT:  Positive for trouble swallowing. Negative for voice change.   Respiratory:  Negative for shortness of breath and wheezing.   Cardiovascular:  Positive for chest pain. Negative for palpitations and leg swelling.  Gastrointestinal:  Negative for abdominal distention, abdominal pain, anal bleeding, blood in stool, constipation, diarrhea, nausea and vomiting.  Musculoskeletal:  Negative for arthralgias and myalgias.  Skin:  Negative for color change and pallor.  Neurological:  Negative for dizziness, syncope and weakness.  Psychiatric/Behavioral:  Negative for confusion. The patient is not nervous/anxious.   All other systems reviewed and are negative.    Medications No current facility-administered medications on file prior to encounter.   Current Outpatient Medications on File Prior to Encounter  Medication Sig Dispense Refill   atorvastatin  (LIPITOR ) 80 MG tablet Take 1 tablet (80 mg total) by mouth daily. 30 tablet 0   azelastine  (ASTELIN ) 0.1 % nasal spray USE 1 TO 2 SPRAYS IN EACH NOSTRIL TWICE A DAY     calcitRIOL  (ROCALTROL ) 0.25 MCG capsule Take 0.25 mcg by mouth daily.     citalopram  (CELEXA ) 10 MG tablet Take 1 tablet (10 mg total) by mouth daily. 90 tablet 3   [employer] VITAMIN B12 1000 MCG tablet TAKE 1 TABLET BY MOUTH EVERY DAY 30 tablet 0   diltiazem  (CARDIZEM ) 30 MG tablet Take 1 tablet (30 mg total) by mouth 2 (two) times daily. Do not take this medication if SBP less than 110 and/or HR less than 60 60 tablet 0   docusate sodium  (COLACE) 100  MG capsule Take 100 mg by mouth 2 (two) times daily. (Patient taking differently: Take 200 mg by mouth daily.)     ergocalciferol  (VITAMIN D2) 1.25 MG (50000 UT) capsule Take 50,000 Units by mouth every 30 (thirty) days.     ferrous sulfate  325 (65 FE) MG EC tablet TAKE 1 TABLET BY MOUTH EVERY DAY 90 tablet 3   fluticasone  (FLONASE ) 50 MCG/ACT nasal spray USE 1 SPRAY IN EACH NOSTRIL EVERY DAY 32 g 6   hydrALAZINE  (APRESOLINE ) 50 MG tablet Take 25 mg by mouth 2 (two) times daily.     isosorbide  mononitrate (IMDUR ) 120 MG 24 hr tablet Take 1 tablet (120 mg total) by mouth 2 (two) times daily. 60 tablet 0   JARDIANCE 10 MG TABS tablet Take 10 mg by mouth daily.     loratadine  (CLARITIN ) 10 MG tablet Take 10 mg by mouth daily.     magnesium  oxide (MAG-OX) 400 (240 Mg) MG tablet Take 1 tablet by mouth daily.     methocarbamol  (ROBAXIN ) 500 MG tablet Take 500 mg by mouth 2 (two) times daily.     metoprolol  succinate (TOPROL -XL) 100 MG  24 hr tablet Take 1 tablet (100 mg total) by mouth daily. Take with or immediately following a meal. 30 tablet 0   mometasone  (ASMANEX ) 220 MCG/ACT inhaler Inhale 2 puffs into the lungs daily.     montelukast  (SINGULAIR ) 10 MG tablet TAKE 1 TABLET AT BEDTIME 90 tablet 3   pantoprazole  (PROTONIX ) 40 MG tablet Take 1 tablet (40 mg total) by mouth 2 (two) times daily before a meal.     ranolazine  (RANEXA ) 500 MG 12 hr tablet Take 1 tablet by mouth 2 (two) times daily.     Tiotropium Bromide-Olodaterol (STIOLTO RESPIMAT) 2.5-2.5 MCG/ACT AERS Inhale 1 each into the lungs daily.     acetaminophen  (TYLENOL ) 500 MG tablet Take 1,000 mg by mouth every 8 (eight) hours as needed for mild pain.     [Paused] clopidogrel  (PLAVIX ) 75 MG tablet TAKE 1 TABLET BY MOUTH EVERY DAY 90 tablet 1   colchicine  0.6 MG tablet Take 1 tablet (0.6 mg total) by mouth 2 (two) times daily as needed. (Patient not taking: Reported on 07/09/2024) 30 tablet 0   EPINEPHrine  (EPI-PEN) 0.3 mg/0.3 mL SOAJ  injection Inject into the muscle as needed (anaphylaxis).     feeding supplement (ENSURE PLUS HIGH PROTEIN) LIQD Take 237 mLs by mouth 3 (three) times daily between meals. 20000 mL 2   insulin  aspart (NOVOLOG ) 100 UNIT/ML FlexPen Inject 10-30 Units into the skin 3 (three) times daily with meals. Take 10 units before breakfast, 30 units before lunch and 30 units before supper.     insulin  glargine (LANTUS ) 100 UNIT/ML injection Inject 0.35 mLs (35 Units total) into the skin at bedtime. This is a decrease from your previous 45 units nightly. (Patient taking differently: Inject 60 Units into the skin at bedtime.) 10 mL 11   lidocaine  (XYLOCAINE ) 2 % solution Use as directed 15 mLs in the mouth or throat every 6 (six) hours as needed (throat/swallowing pain). 200 mL 0   [Paused] metolazone  (ZAROXOLYN ) 2.5 MG tablet Take 2.5 mg by mouth once a week.     nitroGLYCERIN  (NITROSTAT ) 0.4 MG SL tablet Place 0.4 mg under the tongue every 5 (five) minutes x 3 doses as needed for chest pain.     [Paused] torsemide  (DEMADEX ) 20 MG tablet Take 40 mg by mouth 2 (two) times daily.      Pertinent medications related to GI and procedure were reviewed by me with the patient prior to the procedure   Current Facility-Administered Medications:    0.9 %  sodium chloride  infusion, , Intravenous, Continuous, Onita Elspeth Sharper, DO, Last Rate: 20 mL/hr at 07/09/24 0850, New Bag at 07/09/24 0850  sodium chloride  20 mL/hr at 07/09/24 9149       Allergies  Allergen Reactions   Gabapentin      Other reaction(s): Other (See Comments) Tremors   Peanut-Containing Drug Products Anaphylaxis   Penicillins Hives and Rash    Per notes - patient tolerated cefdinir in 2022    Bee Venom Swelling   Influenza Vaccines Hives   Inh [Isoniazid ] Hives   Kenalog [Triamcinolone Acetonide] Hives   Levaquin  [Levofloxacin ] Other (See Comments)    Tendon, ligament pain.    Lisinopril      Other reaction(s): Hyperkalemia   Nalfon  [Fenoprofen Calcium ] Hives   Naproxen    Peanut Oil    Nsaids Rash    Nalfon 600 Nalfon 600   Allergies were reviewed by me prior to the procedure  Objective   Body mass index is 28.25  kg/m. Vitals:   07/09/24 0833  BP: 117/87  Pulse: 60  Resp: 16  Temp: (!) 97.4 F (36.3 C)  TempSrc: Temporal  SpO2: 100%  Weight: 79.4 kg  Height: 5' 6 (1.676 m)     Physical Exam Vitals and nursing note reviewed.  Constitutional:      General: He is not in acute distress.    Appearance: Normal appearance. He is not ill-appearing, toxic-appearing or diaphoretic.  HENT:     Head: Normocephalic and atraumatic.     Nose: Nose normal.     Mouth/Throat:     Mouth: Mucous membranes are moist.     Pharynx: Oropharynx is clear.  Eyes:     General: No scleral icterus.    Extraocular Movements: Extraocular movements intact.  Cardiovascular:     Rate and Rhythm: Normal rate and regular rhythm.  Pulmonary:     Effort: Pulmonary effort is normal. No respiratory distress.     Breath sounds: Normal breath sounds. No wheezing, rhonchi or rales.  Abdominal:     General: Bowel sounds are normal. There is no distension.     Palpations: Abdomen is soft.     Tenderness: There is no abdominal tenderness. There is no guarding or rebound.  Musculoskeletal:     Cervical back: Neck supple.     Right lower leg: No edema.     Left lower leg: No edema.  Skin:    General: Skin is warm and dry.     Coloration: Skin is not jaundiced or pale.  Neurological:     General: No focal deficit present.     Mental Status: He is alert and oriented to person, place, and time. Mental status is at baseline.  Psychiatric:        Mood and Affect: Mood normal.        Behavior: Behavior normal.        Thought Content: Thought content normal.        Judgment: Judgment normal.      Assessment:  [name] is a 83 y.o. male  who presents today for Esophagogastroduodenoscopy for dysphagia, esophageal  stricture.  Plan:  Esophagogastroduodenoscopy with possible intervention today  Esophagogastroduodenoscopy with possible biopsy, control of bleeding, polypectomy, and interventions as necessary has been discussed with the patient/patient representative. Informed consent was obtained from the patient/patient representative after explaining the indication, nature, and risks of the procedure including but not limited to death, bleeding, perforation, missed neoplasm/lesions, cardiorespiratory compromise, and reaction to medications. Opportunity for questions was given and appropriate answers were provided. Patient/patient representative has verbalized understanding is amenable to undergoing the procedure.   [name], DO  [facility]  Portions of the record may have been created with voice recognition software. Occasional wrong-word or 'sound-a-like' substitutions may have occurred due to the inherent limitations of voice recognition software.  Read the chart carefully and recognize, using context, where substitutions may have occurred.

## 2024-07-09 NOTE — Op Note (Signed)
 [facility] Gastroenterology Patient Name: Mike Decker Procedure Date: 07/09/2024 9:16 AM MRN: [id number] Account #: [id number] Date of Birth: Dec 24, 1940 Admit Type: Outpatient Age: 83 Room: [facility] ENDO ROOM 1 Gender: Male Note Status: Finalized Instrument Name: Upper Endoscope [id number] Procedure:             Upper GI endoscopy Indications:           Dysphagia, For therapy of esophageal stricture Providers:             [name] DO, DO Referring MD:          No Local Md, MD (Referring MD) Medicines:             Monitored Anesthesia Care Complications:         No immediate complications. Estimated blood loss:                         Minimal. Procedure:             Pre-Anesthesia Assessment:                        - Prior to the procedure, a History and Physical was                         performed, and patient medications and allergies were                         reviewed. The patient is competent. The risks and                         benefits of the procedure and the sedation options and                         risks were discussed with the patient. All questions                         were answered and informed consent was obtained.                         Patient identification and proposed procedure were                         verified by the physician, the nurse, the anesthetist                         and the technician in the endoscopy suite. Mental                         Status Examination: alert and oriented. Airway                         Examination: normal oropharyngeal airway and neck                         mobility. Respiratory Examination: clear to                         auscultation. CV Examination: RRR, no murmurs, no S3  or S4. Prophylactic Antibiotics: The patient does not                         require prophylactic antibiotics. Prior                         Anticoagulants: The patient has taken no  anticoagulant                         or antiplatelet agents. ASA Grade Assessment: IV - A                         patient with severe systemic disease that is a                         constant threat to life. After reviewing the risks and                         benefits, the patient was deemed in satisfactory                         condition to undergo the procedure. The anesthesia                         plan was to use monitored anesthesia care (MAC).                         Immediately prior to administration of medications,                         the patient was re-assessed for adequacy to receive                         sedatives. The heart rate, respiratory rate, oxygen                         saturations, blood pressure, adequacy of pulmonary                         ventilation, and response to care were monitored                         throughout the procedure. The physical status of the                         patient was re-assessed after the procedure.                        After obtaining informed consent, the endoscope was                         passed under direct vision. Throughout the procedure,                         the patient's blood pressure, pulse, and oxygen                         saturations were monitored continuously. The Endoscope  was introduced through the mouth, and advanced to the                         second part of duodenum. The upper GI endoscopy was                         accomplished without difficulty. The patient tolerated                         the procedure well. Findings:      The duodenal bulb, first portion of the duodenum and second portion of       the duodenum were normal. Estimated blood loss: none.      A medium-sized hiatal hernia was present. Estimated blood loss: none.      The exam of the stomach was otherwise normal.      Esophagogastric landmarks were identified: the gastroesophageal junction        was found at 35 cm from the incisors.      One benign-appearing, intrinsic moderate (circumferential scarring or       stenosis; an endoscope may pass) stenosis was found 35 cm from the       incisors. This stenosis measured 1.1 cm (inner diameter) x less than one       cm (in length). The stenosis was traversed. A TTS dilator was passed       through the scope. Dilation with a 09-20-11 mm balloon and a 12-13.5-15       mm balloon dilator was performed to 10 mm, 11 mm, 12 mm, 13.5 mm and 15       mm. The dilation site was examined following endoscope reinsertion and       showed moderate improvement in luminal narrowing. Estimated blood loss:       none.      The Z-line was irregular. Tongue of salmon colored mucosa. No       nodularity/lesion. Mucosa was biopsied with a cold forceps for       histology. One specimen bottle was sent to pathology. Estimated blood       loss was minimal.      Abnormal motility was noted in the esophagus. The cricopharyngeus was       normal. There is spasticity of the esophageal body. The distal       esophagus/lower esophageal sphincter is open. Tertiary peristaltic waves       are noted. Estimated blood loss: none.      The exam of the esophagus was otherwise normal. Impression:            - Normal duodenal bulb, first portion of the duodenum                         and second portion of the duodenum.                        - Medium-sized hiatal hernia.                        - Esophagogastric landmarks identified.                        - Benign-appearing esophageal stenosis. Dilated.                        -  Z-line irregular. Biopsied.                        - Abnormal esophageal motility, consistent with                         presbyesophagus. Recommendation:        - Patient has a contact number available for                         emergencies. The signs and symptoms of potential                         delayed complications were discussed with the  patient.                         Return to normal activities tomorrow. Written                         discharge instructions were provided to the patient.                        - Discharge patient to home.                        - Soft diet today.                        - Continue present medications.                        - Resume Plavix  (clopidogrel ) at prior dose tomorrow.                         Refer to managing physician for further adjustment of                         therapy.                        - Await pathology results.                        - Repeat upper endoscopy in 3 weeks for retreatment.                        - Return to GI office as previously scheduled.                        - The findings and recommendations were discussed with                         the patient. Procedure Code(s):     --- Professional ---                        [phone], Esophagogastroduodenoscopy, flexible,                         transoral; with transendoscopic balloon dilation of  esophagus (less than 30 mm diameter)                        43239, 59, Esophagogastroduodenoscopy, flexible,                         transoral; with biopsy, single or multiple Diagnosis Code(s):     --- Professional ---                        K44.9, Diaphragmatic hernia without obstruction or                         gangrene                        K22.2, Esophageal obstruction                        K22.89, Other specified disease of esophagus                        K22.4, Dyskinesia of esophagus                        R13.10, Dysphagia, unspecified CPT copyright 2022 [employer]. All rights reserved. The codes documented in this report are preliminary and upon coder review may  be revised to meet current compliance requirements. Attending Participation:      I personally performed the entire procedure. Elspeth Jungling, DO [name] DO, DO 07/09/2024 9:46:44  AM This report has been signed electronically. Number of Addenda: 0 Note Initiated On: 07/09/2024 9:16 AM Estimated Blood Loss:  Estimated blood loss was minimal.      [facility]

## 2024-07-09 NOTE — Interval H&P Note (Signed)
 History and Physical Interval Note: Preprocedure H&P from 07/09/24  was reviewed and there was no interval change after seeing and examining the patient.  Written consent was obtained from the patient after discussion of risks, benefits, and alternatives. Patient has consented to proceed with Esophagogastroduodenoscopy with possible intervention   07/09/2024 9:14 AM  Mike Decker  has presented today for surgery, with the diagnosis of Dysphagia, unspecified type (R13.10).  The various methods of treatment have been discussed with the patient and family. After consideration of risks, benefits and other options for treatment, the patient has consented to  Procedure(s) with comments: EGD (ESOPHAGOGASTRODUODENOSCOPY) (N/A) - DM as a surgical intervention.  The patient's history has been reviewed, patient examined, no change in status, stable for surgery.  I have reviewed the patient's chart and labs.  Questions were answered to the patient's satisfaction.     [name]

## 2024-07-09 NOTE — Anesthesia Preprocedure Evaluation (Signed)
 Anesthesia Evaluation  Patient identified by MRN, date of birth, ID band Patient awake    Reviewed: Allergy & Precautions, NPO status , Patient's Chart, lab work & pertinent test results  Airway Mallampati: III  TM Distance: >3 FB Neck ROM: full    Dental  (+) Teeth Intact   Pulmonary neg pulmonary ROS, asthma , sleep apnea , COPD,  COPD inhaler, former smoker   Pulmonary exam normal  + decreased breath sounds      Cardiovascular Exercise Tolerance: Poor hypertension, Pt. on medications + angina  + CAD, + Past MI, + Cardiac Stents, + Peripheral Vascular Disease, +CHF and + DOE  negative cardio ROS Normal cardiovascular exam Rhythm:Regular Rate:Normal     Neuro/Psych    Depression    TIAnegative neurological ROS  negative psych ROS   GI/Hepatic negative GI ROS, Neg liver ROS,GERD  ,,  Endo/Other  negative endocrine ROSdiabetes, Type 2  Class 3 obesity  Renal/GU CRFRenal diseasenegative Renal ROS  negative genitourinary   Musculoskeletal   Abdominal  (+) + obese  Peds  Hematology negative hematology ROS (+) Blood dyscrasia, anemia   Anesthesia Other Findings Past Medical History: No date: Anemia No date: Arthritis No date: Atrioventricular canal (AVC)     Comment:  irregular heart beats 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 No date: Bronchiolitis 2002: Cancer (HCC)     Comment:  prostate, esphageal No date: Chronic diastolic CHF (congestive heart failure) (HCC) No date: Colon polyp No date: Diabetes mellitus without complication (HCC) No date: Diverticulosis No date: Gout No date: Heart disease No date: Hemangioma     Comment:  liver No date: Hyperlipidemia No date: Hypertension No date: Myocardial infarct (HCC) No date: Ocular hypertension No date: Peripheral vascular disease (HCC) No date: Skin cancer No date: 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HCC) No date: Sleep apnea 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  Past Surgical  History: 2011, 2012: CATARACT EXTRACTION 12/14/2018: COLONOSCOPY; N/A     Comment:  Procedure: COLONOSCOPY;  Surgeon: Janalyn Keene NOVAK,               MD;  Location: [facility] ENDOSCOPY;  Service: Endoscopy;                Laterality: N/A; 12/10/2010: CORONARY ANGIOPLASTY WITH STENT PLACEMENT 12/29/2019: CORONARY STENT INTERVENTION; N/A     Comment:  Procedure: CORONARY STENT INTERVENTION;  Surgeon: Mady Bruckner, MD;  Location: [facility] INVASIVE CV LAB;                Service: Cardiovascular;  Laterality: N/A; 12/14/2018: ESOPHAGOGASTRODUODENOSCOPY; N/A     Comment:  Procedure: ESOPHAGOGASTRODUODENOSCOPY (EGD);  Surgeon:               Janalyn Keene NOVAK, MD;  Location: [facility] ENDOSCOPY;                Service: Endoscopy;  Laterality: N/A; 06/01/2016: ESOPHAGOGASTRODUODENOSCOPY (EGD) WITH PROPOFOL ; N/A     Comment:  Procedure: ESOPHAGOGASTRODUODENOSCOPY (EGD) WITH               PROPOFOL ;  Surgeon: Lamar ONEIDA Holmes, MD;  Location: [facility]              ENDOSCOPY;  Service: Endoscopy;  Laterality: N/A; 01/31/2023: ESOPHAGOGASTRODUODENOSCOPY (EGD) WITH PROPOFOL ; N/A     Comment:  Procedure: ESOPHAGOGASTRODUODENOSCOPY (EGD) WITH               PROPOFOL ;  Surgeon: Jinny Carmine, MD;  Location:  [facility]               ENDOSCOPY;  Service: Endoscopy;  Laterality: N/A; No date: FEMORAL ARTERY STENT; Right No date: HERNIA REPAIR     Comment:  x2 No date: INTRAOCULAR LENS INSERTION 05/09/2017: LEFT HEART CATH AND CORONARY ANGIOGRAPHY; N/A     Comment:  Procedure: Left Heart Cath and Coronary Angiography;                Surgeon: Florencio Cara BIRCH, MD;  Location: [facility] INVASIVE              CV LAB;  Service: Cardiovascular;  Laterality: N/A; 12/08/2018: LEFT HEART CATH AND CORONARY ANGIOGRAPHY; N/A     Comment:  Procedure: LEFT HEART CATH AND CORONARY ANGIOGRAPHY and               possible PCI and stent;  Surgeon: Florencio Cara BIRCH, MD;              Location: [facility] INVASIVE CV LAB;  Service:  Cardiovascular;              Laterality: N/A; 12/29/2019: LEFT HEART CATH AND CORONARY ANGIOGRAPHY; N/A     Comment:  Procedure: LEFT HEART CATH AND CORONARY ANGIOGRAPHY;                Surgeon: Mady Bruckner, MD;  Location: [facility] INVASIVE               CV LAB;  Service: Cardiovascular;  Laterality: N/A; 12/13/2022: LEFT HEART CATH AND CORONARY ANGIOGRAPHY; Left     Comment:  Procedure: LEFT HEART CATH AND CORONARY ANGIOGRAPHY;                Surgeon: Florencio Cara BIRCH, MD;  Location: [facility] INVASIVE              CV LAB;  Service: Cardiovascular;  Laterality: Left; No date: NOSE SURGERY     Comment:  submucous resection 12/10/2000: PROSTATE SURGERY No date: ROTATOR CUFF REPAIR; Right  BMI    Body Mass Index: 28.25 kg/m      Reproductive/Obstetrics negative OB ROS                              Anesthesia Physical Anesthesia Plan  ASA: 4  Anesthesia Plan: General   Post-op Pain Management:    Induction: Intravenous  PONV Risk Score and Plan: Propofol  infusion and TIVA  Airway Management Planned: Natural Airway and Nasal Cannula  Additional Equipment:   Intra-op Plan:   Post-operative Plan:   Informed Consent: I have reviewed the patients History and Physical, chart, labs and discussed the procedure including the risks, benefits and alternatives for the proposed anesthesia with the patient or authorized representative who has indicated his/her understanding and acceptance.     Dental Advisory Given  Plan Discussed with: CRNA  Anesthesia Plan Comments:         Anesthesia Quick Evaluation

## 2024-07-13 DIAGNOSIS — I129 Hypertensive chronic kidney disease with stage 1 through stage 4 chronic kidney disease, or unspecified chronic kidney disease: Secondary | ICD-10-CM | POA: Diagnosis not present

## 2024-07-13 DIAGNOSIS — E1122 Type 2 diabetes mellitus with diabetic chronic kidney disease: Secondary | ICD-10-CM | POA: Diagnosis not present

## 2024-07-13 DIAGNOSIS — E875 Hyperkalemia: Secondary | ICD-10-CM | POA: Diagnosis not present

## 2024-07-13 DIAGNOSIS — D631 Anemia in chronic kidney disease: Secondary | ICD-10-CM | POA: Diagnosis not present

## 2024-07-13 DIAGNOSIS — N184 Chronic kidney disease, stage 4 (severe): Secondary | ICD-10-CM | POA: Diagnosis not present

## 2024-07-13 DIAGNOSIS — N2581 Secondary hyperparathyroidism of renal origin: Secondary | ICD-10-CM | POA: Diagnosis not present

## 2024-07-13 LAB — SURGICAL PATHOLOGY

## 2024-07-27 DIAGNOSIS — E782 Mixed hyperlipidemia: Secondary | ICD-10-CM | POA: Diagnosis not present

## 2024-07-27 DIAGNOSIS — R0602 Shortness of breath: Secondary | ICD-10-CM | POA: Diagnosis not present

## 2024-07-27 DIAGNOSIS — K219 Gastro-esophageal reflux disease without esophagitis: Secondary | ICD-10-CM | POA: Diagnosis not present

## 2024-07-27 DIAGNOSIS — R55 Syncope and collapse: Secondary | ICD-10-CM | POA: Diagnosis not present

## 2024-07-27 DIAGNOSIS — G4733 Obstructive sleep apnea (adult) (pediatric): Secondary | ICD-10-CM | POA: Diagnosis not present

## 2024-07-27 DIAGNOSIS — I2510 Atherosclerotic heart disease of native coronary artery without angina pectoris: Secondary | ICD-10-CM | POA: Diagnosis not present

## 2024-07-27 DIAGNOSIS — I2585 Chronic coronary microvascular dysfunction: Secondary | ICD-10-CM | POA: Diagnosis not present

## 2024-07-27 DIAGNOSIS — R079 Chest pain, unspecified: Secondary | ICD-10-CM | POA: Diagnosis not present

## 2024-07-27 DIAGNOSIS — I480 Paroxysmal atrial fibrillation: Secondary | ICD-10-CM | POA: Diagnosis not present

## 2024-07-27 DIAGNOSIS — J42 Unspecified chronic bronchitis: Secondary | ICD-10-CM | POA: Diagnosis not present

## 2024-07-27 DIAGNOSIS — I5032 Chronic diastolic (congestive) heart failure: Secondary | ICD-10-CM | POA: Diagnosis not present

## 2024-07-28 ENCOUNTER — Other Ambulatory Visit (INDEPENDENT_AMBULATORY_CARE_PROVIDER_SITE_OTHER): Payer: Self-pay | Admitting: Nurse Practitioner

## 2024-07-28 DIAGNOSIS — I6523 Occlusion and stenosis of bilateral carotid arteries: Secondary | ICD-10-CM

## 2024-07-30 ENCOUNTER — Encounter (INDEPENDENT_AMBULATORY_CARE_PROVIDER_SITE_OTHER): Payer: Self-pay | Admitting: Nurse Practitioner

## 2024-07-30 ENCOUNTER — Other Ambulatory Visit (INDEPENDENT_AMBULATORY_CARE_PROVIDER_SITE_OTHER): Payer: Medicare Other

## 2024-07-30 ENCOUNTER — Ambulatory Visit (INDEPENDENT_AMBULATORY_CARE_PROVIDER_SITE_OTHER): Payer: No Typology Code available for payment source | Admitting: Nurse Practitioner

## 2024-07-30 VITALS — BP 130/63 | HR 67 | Ht 66.0 in | Wt 182.0 lb

## 2024-07-30 DIAGNOSIS — I10 Essential (primary) hypertension: Secondary | ICD-10-CM | POA: Diagnosis not present

## 2024-07-30 DIAGNOSIS — I6523 Occlusion and stenosis of bilateral carotid arteries: Secondary | ICD-10-CM

## 2024-08-03 ENCOUNTER — Encounter (INDEPENDENT_AMBULATORY_CARE_PROVIDER_SITE_OTHER): Payer: Self-pay | Admitting: Nurse Practitioner

## 2024-08-03 NOTE — Progress Notes (Signed)
 Subjective:    Patient ID: Mike Decker, male    DOB: Dec 01, 1941, 83 y.o.   MRN: [id number] Chief Complaint  Patient presents with   Carotid    1 year follow up  He stated that he having dizziness going on 4-5 months     The patient returns today for follow-up of his carotid artery stenosis.  He denies any TIA or CVA-like symptoms.  However he does endorse having worsening dizziness over the last 4 to 5 months.  He notes that these episodes happen sporadically.  They do not necessarily happen with activity or moving from sitting to standing.  He denies any loss of consciousness at this time.  Today his noninvasive studies show 40 to 59% stenosis of the right ICA 1 to 39% stenosis of the left ICA.  It is noted that the right ICA has a more calcific irregular appearance.  The velocity is are towards the higher end of that bracket.  The bilateral vertebral arteries have antegrade flow with normal flow seen in the bilateral subclavian arteries.    Review of Systems  Neurological:  Positive for dizziness.  All other systems reviewed and are negative.      Objective:   Physical Exam Vitals reviewed.  HENT:     Head: Normocephalic.  Neck:     Vascular: Carotid bruit present.  Cardiovascular:     Rate and Rhythm: Normal rate.     Pulses: Normal pulses.     Heart sounds: Murmur heard.  Pulmonary:     Effort: Pulmonary effort is normal.  Skin:    General: Skin is warm and dry.  Neurological:     Mental Status: He is alert and oriented to person, place, and time.  Psychiatric:        Mood and Affect: Mood normal.        Behavior: Behavior normal.        Thought Content: Thought content normal.        Judgment: Judgment normal.     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, esphageal   Chronic diastolic CHF (congestive heart failure) (HCC)    Colon polyp    Diabetes mellitus without complication (HCC)    Diverticulosis    Gout    Heart disease    Hemangioma    liver   Hyperlipidemia    Hypertension    Myocardial infarct (HCC)    Ocular hypertension    Peripheral vascular disease (HCC)    Skin cancer    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HCC)    Sleep apnea    Vitreoretinal degeneration     Social History   Socioeconomic History   Marital status: Married    Spouse name: Not on file   Number of children: 2   Years of education: College 3 years   Highest education level: Associate degree: occupational, Scientist, product/process development, or vocational program  Occupational History   Occupation: retired  Tobacco Use   Smoking status: Former    Current packs/day: 0.00    Types: Cigarettes    Start date: 12/10/1956    Quit date: 12/10/1976  Years since quitting: 47.6   Smokeless tobacco: Never  Vaping Use   Vaping status: Never Used  Substance and Sexual Activity   Alcohol use: No   Drug use: No   Sexual activity: Not Currently  Other Topics Concern   Not on file  Social History Narrative   Not on file   Social Drivers of Health   Financial Resource Strain: Low Risk  (06/21/2023)   Received from [facility]   Overall Financial Resource Strain (CARDIA)    Difficulty of Paying Living Expenses: Not very hard  Food Insecurity: No Food Insecurity (07/03/2024)   Hunger Vital Sign    Worried About Running Out of Food in the Last Year: Never true    Ran Out of Food in the Last Year: Never true  Transportation Needs: No Transportation Needs (07/03/2024)   PRAPARE - Administrator, Civil Service (Medical): No    Lack of Transportation (Non-Medical): No  Physical Activity: Insufficiently Active (09/15/2021)   Exercise Vital Sign    Days of Exercise per Week: 7 days    Minutes of Exercise per Session: 20 min   Stress: No Stress Concern Present (09/15/2021)   [employer] of Occupational Health - Occupational Stress Questionnaire    Feeling of Stress : Not at all  Social Connections: Moderately Isolated (07/03/2024)   Social Connection and Isolation Panel    Frequency of Communication with Friends and Family: Once a week    Frequency of Social Gatherings with Friends and Family: More than three times a week    Attends Religious Services: Never    Database administrator or Organizations: No    Attends Banker Meetings: Never    Marital Status: Married  Catering manager Violence: Not At Risk (07/03/2024)   Humiliation, Afraid, Rape, and Kick questionnaire    Fear of Current or Ex-Partner: No    Emotionally Abused: No    Physically Abused: No    Sexually Abused: No    Past Surgical History:  Procedure Laterality Date   CATARACT EXTRACTION  2011, 2012   COLONOSCOPY N/A 12/14/2018   Procedure: COLONOSCOPY;  Surgeon: Janalyn Keene NOVAK, MD;  Location: [facility] ENDOSCOPY;  Service: Endoscopy;  Laterality: N/A;   CORONARY ANGIOPLASTY WITH STENT PLACEMENT  12/10/2010   CORONARY STENT INTERVENTION N/A 12/29/2019   Procedure: CORONARY STENT INTERVENTION;  Surgeon: Mady Bruckner, MD;  Location: [facility] INVASIVE CV LAB;  Service: Cardiovascular;  Laterality: N/A;   ESOPHAGOGASTRODUODENOSCOPY N/A 12/14/2018   Procedure: ESOPHAGOGASTRODUODENOSCOPY (EGD);  Surgeon: Janalyn Keene NOVAK, MD;  Location: [facility] ENDOSCOPY;  Service: Endoscopy;  Laterality: N/A;   ESOPHAGOGASTRODUODENOSCOPY N/A 07/09/2024   Procedure: EGD (ESOPHAGOGASTRODUODENOSCOPY);  Surgeon: Onita Elspeth Sharper, DO;  Location: [facility] ENDOSCOPY;  Service: Gastroenterology;  Laterality: N/A;  DM   ESOPHAGOGASTRODUODENOSCOPY (EGD) WITH PROPOFOL  N/A 06/01/2016   Procedure: ESOPHAGOGASTRODUODENOSCOPY (EGD) WITH PROPOFOL ;  Surgeon: Lamar ONEIDA Holmes, MD;  Location: [facility] ENDOSCOPY;  Service: Endoscopy;  Laterality: N/A;    ESOPHAGOGASTRODUODENOSCOPY (EGD) WITH PROPOFOL  N/A 01/31/2023   Procedure: ESOPHAGOGASTRODUODENOSCOPY (EGD) WITH PROPOFOL ;  Surgeon: Jinny Carmine, MD;  Location: [facility] ENDOSCOPY;  Service: Endoscopy;  Laterality: N/A;   FEMORAL ARTERY STENT Right    HERNIA REPAIR     x2   INTRAOCULAR LENS INSERTION     LEFT HEART CATH AND CORONARY ANGIOGRAPHY N/A 05/09/2017   Procedure: Left Heart Cath and Coronary Angiography;  Surgeon: Florencio Cara BIRCH, MD;  Location: [facility] INVASIVE CV LAB;  Service: Cardiovascular;  Laterality:  N/A;   LEFT HEART CATH AND CORONARY ANGIOGRAPHY N/A 12/08/2018   Procedure: LEFT HEART CATH AND CORONARY ANGIOGRAPHY and possible PCI and stent;  Surgeon: Florencio Cara BIRCH, MD;  Location: [facility] INVASIVE CV LAB;  Service: Cardiovascular;  Laterality: N/A;   LEFT HEART CATH AND CORONARY ANGIOGRAPHY N/A 12/29/2019   Procedure: LEFT HEART CATH AND CORONARY ANGIOGRAPHY;  Surgeon: Mady Bruckner, MD;  Location: [facility] INVASIVE CV LAB;  Service: Cardiovascular;  Laterality: N/A;   LEFT HEART CATH AND CORONARY ANGIOGRAPHY Left 12/13/2022   Procedure: LEFT HEART CATH AND CORONARY ANGIOGRAPHY;  Surgeon: Florencio Cara BIRCH, MD;  Location: [facility] INVASIVE CV LAB;  Service: Cardiovascular;  Laterality: Left;   NOSE SURGERY     submucous resection   PROSTATE SURGERY  12/10/2000   ROTATOR CUFF REPAIR Right     Family History  Problem Relation Age of Onset   Arthritis Mother    Stroke Maternal Grandfather    Breast cancer Neg Hx     Allergies  Allergen Reactions   Gabapentin      Other reaction(s): Other (See Comments) Tremors   Peanut-Containing Drug Products Anaphylaxis   Penicillins Hives and Rash    Per notes - patient tolerated cefdinir in 2022    Bee Venom Swelling   Influenza Vaccines Hives   Inh [Isoniazid ] Hives   Kenalog [Triamcinolone Acetonide] Hives   Levaquin  [Levofloxacin ] Other (See Comments)    Tendon, ligament pain.    Lisinopril      Other reaction(s): Hyperkalemia    Nalfon [Fenoprofen Calcium ] Hives   Naproxen    Peanut Oil    Nsaids Rash    Nalfon 600 Nalfon 600       Latest Ref Rng & Units 07/07/2024    7:36 AM 07/03/2024    4:55 AM 07/01/2024   11:05 PM  CBC  WBC 4.0 - 10.5 K/uL 9.0  9.4  10.4   Hemoglobin 13.0 - 17.0 g/dL 88.6  87.7  87.7   Hematocrit 39.0 - 52.0 % 34.3  36.9  36.1   Platelets 150 - 400 K/uL 202  221  216       CMP     Component Value Date/Time   NA 133 (L) 07/07/2024 0736   NA 139 11/03/2014 0711   K 5.0 07/07/2024 0736   K 4.6 11/03/2014 0711   CL 97 (L) 07/07/2024 0736   CL 104 11/03/2014 0711   CO2 25 07/07/2024 0736   CO2 28 11/03/2014 0711   GLUCOSE 341 (H) 07/07/2024 0736   GLUCOSE 234 (H) 11/03/2014 0711   BUN 50 (H) 07/07/2024 0736   BUN 43 (H) 11/03/2014 0711   CREATININE 2.51 (H) 07/07/2024 0736   CREATININE 2.35 (H) 06/28/2021 1013   CALCIUM  8.8 (L) 07/07/2024 0736   CALCIUM  8.9 11/03/2014 0711   PROT 6.8 04/23/2024 0328   ALBUMIN  3.6 07/05/2024 0909   AST 19 04/23/2024 0328   ALT 16 04/23/2024 0328   ALKPHOS 51 04/23/2024 0328   BILITOT 0.7 04/23/2024 0328   EGFR 19.0 05/13/2024 0000   EGFR 27 (L) 06/28/2021 1013   GFRNONAA 25 (L) 07/07/2024 0736   GFRNONAA 43 (L) 11/03/2014 0711     No results found.     Assessment & Plan:   1. Bilateral carotid artery stenosis (Primary) Given the patient's ongoing symptoms the best way to evaluate if his carotid artery is contributing to the symptoms.  With a CT angiogram.  However the patient is currently suffering with chronic kidney disease  with an elevated creatinine.  This will preclude him from being able to do a CT angiogram.  Therefore, in order to truly evaluate his degree of carotid stenosis as well as to possibly intervene he would have the patient undergo carotid angiogram.  I have discussed with the patient that this may just be diagnostic in which case it would be a same-day procedure however if he is found to require carotid stenting  and has suprarenal anatomy it will be done that day as well.  If he ultimately has carotid stent placed he will need to stay overnight.  We have discussed the risk, benefits and alternatives to the procedure.  He is agreeable to move forward.  2. Essential hypertension Continue antihypertensive medications as already ordered, these medications have been reviewed and there are no changes at this time.   Current Outpatient Medications on File Prior to Visit  Medication Sig Dispense Refill   acetaminophen  (TYLENOL ) 500 MG tablet Take 1,000 mg by mouth every 8 (eight) hours as needed for mild pain.     apixaban (ELIQUIS) 2.5 MG TABS tablet Take 2.5 mg by mouth.     atorvastatin  (LIPITOR ) 80 MG tablet Take 1 tablet (80 mg total) by mouth daily. 30 tablet 0   azelastine  (ASTELIN ) 0.1 % nasal spray USE 1 TO 2 SPRAYS IN EACH NOSTRIL TWICE A DAY     calcitRIOL  (ROCALTROL ) 0.25 MCG capsule Take 0.25 mcg by mouth daily.     citalopram  (CELEXA ) 10 MG tablet Take 1 tablet (10 mg total) by mouth daily. 90 tablet 3   [employer] VITAMIN B12 1000 MCG tablet TAKE 1 TABLET BY MOUTH EVERY DAY 30 tablet 0   diltiazem  (CARDIZEM ) 30 MG tablet Take 1 tablet (30 mg total) by mouth 2 (two) times daily. Do not take this medication if SBP less than 110 and/or HR less than 60 60 tablet 0   docusate sodium  (COLACE) 100 MG capsule Take 100 mg by mouth 2 (two) times daily. (Patient taking differently: Take 200 mg by mouth daily.)     EPINEPHrine  (EPI-PEN) 0.3 mg/0.3 mL SOAJ injection Inject into the muscle as needed (anaphylaxis).     ergocalciferol  (VITAMIN D2) 1.25 MG (50000 UT) capsule Take 50,000 Units by mouth every 30 (thirty) days.     feeding supplement (ENSURE PLUS HIGH PROTEIN) LIQD Take 237 mLs by mouth 3 (three) times daily between meals. 20000 mL 2   ferrous sulfate  325 (65 FE) MG EC tablet TAKE 1 TABLET BY MOUTH EVERY DAY 90 tablet 3   fluticasone  (FLONASE ) 50 MCG/ACT nasal spray USE 1 SPRAY IN EACH NOSTRIL EVERY DAY 32  g 6   hydrALAZINE  (APRESOLINE ) 50 MG tablet Take 25 mg by mouth 2 (two) times daily.     insulin  aspart (NOVOLOG ) 100 UNIT/ML FlexPen Inject 10-30 Units into the skin 3 (three) times daily with meals. Take 10 units before breakfast, 30 units before lunch and 30 units before supper.     insulin  glargine (LANTUS ) 100 UNIT/ML injection Inject 0.35 mLs (35 Units total) into the skin at bedtime. This is a decrease from your previous 45 units nightly. (Patient taking differently: Inject 60 Units into the skin at bedtime.) 10 mL 11   isosorbide  mononitrate (IMDUR ) 120 MG 24 hr tablet Take 1 tablet (120 mg total) by mouth 2 (two) times daily. 60 tablet 0   JARDIANCE 10 MG TABS tablet Take 10 mg by mouth daily.     lidocaine  (XYLOCAINE ) 2 % solution Use  as directed 15 mLs in the mouth or throat every 6 (six) hours as needed (throat/swallowing pain). 200 mL 0   loratadine  (CLARITIN ) 10 MG tablet Take 10 mg by mouth daily.     magnesium  oxide (MAG-OX) 400 (240 Mg) MG tablet Take 1 tablet by mouth daily.     methocarbamol  (ROBAXIN ) 500 MG tablet Take 500 mg by mouth 2 (two) times daily.     [Paused] metolazone  (ZAROXOLYN ) 2.5 MG tablet Take 2.5 mg by mouth once a week.     metoprolol  succinate (TOPROL -XL) 100 MG 24 hr tablet Take 1 tablet (100 mg total) by mouth daily. Take with or immediately following a meal. 30 tablet 0   mometasone  (ASMANEX ) 220 MCG/ACT inhaler Inhale 2 puffs into the lungs daily.     montelukast  (SINGULAIR ) 10 MG tablet TAKE 1 TABLET AT BEDTIME 90 tablet 3   nitroGLYCERIN  (NITROSTAT ) 0.4 MG SL tablet Place 0.4 mg under the tongue every 5 (five) minutes x 3 doses as needed for chest pain.     pantoprazole  (PROTONIX ) 40 MG tablet Take 1 tablet (40 mg total) by mouth 2 (two) times daily before a meal.     ranolazine  (RANEXA ) 500 MG 12 hr tablet Take 1 tablet by mouth 2 (two) times daily.     Tiotropium Bromide-Olodaterol (STIOLTO RESPIMAT) 2.5-2.5 MCG/ACT AERS Inhale 1 each into the lungs  daily.     [Paused] torsemide  (DEMADEX ) 20 MG tablet Take 40 mg by mouth 2 (two) times daily.     [Paused] clopidogrel  (PLAVIX ) 75 MG tablet TAKE 1 TABLET BY MOUTH EVERY DAY (Patient not taking: Reported on 07/30/2024) 90 tablet 1   colchicine  0.6 MG tablet Take 1 tablet (0.6 mg total) by mouth 2 (two) times daily as needed. (Patient not taking: Reported on 07/30/2024) 30 tablet 0   No current facility-administered medications on file prior to visit.    There are no Patient Instructions on file for this visit. No follow-ups on file.   Khoen Genet E Akayla Brass, NP

## 2024-08-04 ENCOUNTER — Ambulatory Visit: Admitting: Nurse Practitioner

## 2024-08-05 DIAGNOSIS — E875 Hyperkalemia: Secondary | ICD-10-CM | POA: Diagnosis not present

## 2024-08-05 DIAGNOSIS — N184 Chronic kidney disease, stage 4 (severe): Secondary | ICD-10-CM | POA: Diagnosis not present

## 2024-08-05 DIAGNOSIS — D631 Anemia in chronic kidney disease: Secondary | ICD-10-CM | POA: Diagnosis not present

## 2024-08-05 DIAGNOSIS — N2581 Secondary hyperparathyroidism of renal origin: Secondary | ICD-10-CM | POA: Diagnosis not present

## 2024-08-05 DIAGNOSIS — I129 Hypertensive chronic kidney disease with stage 1 through stage 4 chronic kidney disease, or unspecified chronic kidney disease: Secondary | ICD-10-CM | POA: Diagnosis not present

## 2024-08-05 DIAGNOSIS — E1122 Type 2 diabetes mellitus with diabetic chronic kidney disease: Secondary | ICD-10-CM | POA: Diagnosis not present

## 2024-08-07 ENCOUNTER — Telehealth (INDEPENDENT_AMBULATORY_CARE_PROVIDER_SITE_OTHER): Payer: Self-pay

## 2024-08-07 NOTE — Telephone Encounter (Signed)
 Patient's spouse called back and the patient is scheduled with Dr. Marea for a  right carotid angio/possible stent placement on 08/24/24 with a 10:30 am arrival time to the [facility]. Pre-procedure instructions were discussed and will be sent to Mychart and mailed.

## 2024-08-07 NOTE — Telephone Encounter (Signed)
 Spoke with the patient to schedule him for a right carotid stent placement with Dr. Marea at the [facility]. Patient stated he wanted to discuss with his spouse and will call back.

## 2024-08-11 ENCOUNTER — Telehealth (INDEPENDENT_AMBULATORY_CARE_PROVIDER_SITE_OTHER): Payer: Self-pay

## 2024-08-11 DIAGNOSIS — E875 Hyperkalemia: Secondary | ICD-10-CM | POA: Diagnosis not present

## 2024-08-11 DIAGNOSIS — D631 Anemia in chronic kidney disease: Secondary | ICD-10-CM | POA: Diagnosis not present

## 2024-08-11 DIAGNOSIS — N184 Chronic kidney disease, stage 4 (severe): Secondary | ICD-10-CM | POA: Diagnosis not present

## 2024-08-11 DIAGNOSIS — E1122 Type 2 diabetes mellitus with diabetic chronic kidney disease: Secondary | ICD-10-CM | POA: Diagnosis not present

## 2024-08-11 DIAGNOSIS — N2581 Secondary hyperparathyroidism of renal origin: Secondary | ICD-10-CM | POA: Diagnosis not present

## 2024-08-11 DIAGNOSIS — I129 Hypertensive chronic kidney disease with stage 1 through stage 4 chronic kidney disease, or unspecified chronic kidney disease: Secondary | ICD-10-CM | POA: Diagnosis not present

## 2024-08-11 NOTE — Telephone Encounter (Signed)
 Patient's spouse left a message for a return call regarding the patient's right carotid stent placement with Dr. Marea on 08/24/24. A message was left for a return call.

## 2024-08-18 ENCOUNTER — Other Ambulatory Visit

## 2024-08-19 ENCOUNTER — Ambulatory Visit: Admitting: Nurse Practitioner

## 2024-08-20 ENCOUNTER — Ambulatory Visit: Admitting: Oncology

## 2024-08-20 ENCOUNTER — Ambulatory Visit

## 2024-08-20 DIAGNOSIS — E1142 Type 2 diabetes mellitus with diabetic polyneuropathy: Secondary | ICD-10-CM | POA: Diagnosis not present

## 2024-08-20 DIAGNOSIS — R809 Proteinuria, unspecified: Secondary | ICD-10-CM | POA: Diagnosis not present

## 2024-08-20 DIAGNOSIS — Z1331 Encounter for screening for depression: Secondary | ICD-10-CM | POA: Diagnosis not present

## 2024-08-20 DIAGNOSIS — E1129 Type 2 diabetes mellitus with other diabetic kidney complication: Secondary | ICD-10-CM | POA: Diagnosis not present

## 2024-08-20 DIAGNOSIS — E1122 Type 2 diabetes mellitus with diabetic chronic kidney disease: Secondary | ICD-10-CM | POA: Diagnosis not present

## 2024-08-20 DIAGNOSIS — E1159 Type 2 diabetes mellitus with other circulatory complications: Secondary | ICD-10-CM | POA: Diagnosis not present

## 2024-08-20 DIAGNOSIS — Z794 Long term (current) use of insulin: Secondary | ICD-10-CM | POA: Diagnosis not present

## 2024-08-20 DIAGNOSIS — N184 Chronic kidney disease, stage 4 (severe): Secondary | ICD-10-CM | POA: Diagnosis not present

## 2024-08-20 DIAGNOSIS — I2510 Atherosclerotic heart disease of native coronary artery without angina pectoris: Secondary | ICD-10-CM | POA: Diagnosis not present

## 2024-08-20 NOTE — Progress Notes (Signed)
 Providers include: Primary Care = Sheralyn Flock, MD Second primary care = Elvie Moats, MD at the [facility] in [place] (fax [phone]) Cardiology = Cara Lovelace, MD Vascular Surgery = Selinda Gu, MD Nephrology = Saralee Stank, MD Gastroenterology = Deward Piedmont, MD Hematology = Zelphia Cap, MD Pulmonary = Normajean Bathe, MD Gastroenterology = Rogelia Copping, MD  Chief complaint: Diabetes  History of present illness: Mike Decker is a 83 y.o. male seen in follow up. He was last seen on 05/19/2024.   Type 2 diabetes. Last Hb A1c is now 7%. His insulins are dosed as: Lantus  60 units qhs and NovoLog  to 10 / 30 / 30 units before meals. He has a [employer] 2 CGM. Device was downloaded and reviewed. A two week blood glucose average is 171 mg/dl. Target set at 70 - 180 mg/dl. 61% of readings are in target, 38% above target and 1% are below target. He scans his blood glucose on avg 5 times daily. 83% of blood glucose readings over last 2 weeks were captured. Interpretation: fairly stable blood glucose readings throughout the day and night typically with modest day-to-date blood glucose variability.    Diabetes complications. 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 Not taking ACE/ARB due to hyperkalemia.  He follows with Nephrology.   Congestive heart failure. His Cardiologist added Jardiance on 01/23/2022.  PAD. He is scheduled for coronary angiogram next week.     Past Medical History:  Diagnosis Date  . Acute diverticulitis 2002  . 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 Anemia of chronic disease   . Aortic atherosclerosis ()    on CT scan 12/2018  . BPH (benign prostatic hypertrophy)   . CAD (coronary artery disease)    NSTEMI 05/2017, s/p cardiac cath showing 2V disease. NSTEMI 12/2019, s/p cath with PCI and stent placement. NSTEM 12/2022, s/p cath with mutivessel diz, no intervention   . Cataract cortical, senile   . CKD (chronic kidney disease) stage 4   . COPD (chronic obstructive pulmonary disease) (CMS/HHS-HCC)   . Diverticulosis 02/17/2015  . Gout   . Heart attack (CMS/HHS-HCC)    04/2017, 12/2019, 12/2022  . Heart failure, ischemic   . History of chicken pox   . History of GI bleed   . History of prostate cancer   . Hypercholesterolemia   . Hypertension   . Kidney stones   . Melanoma (CMS/HHS-HCC) 2020   Left upper leg and cheek, s/p Moh's excision  . Obesity   . OSA (obstructive sleep apnea)   . PONV (postoperative nausea and vomiting)    nausea with first EGD/ablation  . 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 Salmonella gastroenteritis    Hospitalization [facility] 05/2017  . Stroke (CMS/HHS-HCC) 09/2015  . Tubular adenoma of colon 02/17/2015  . Type 2 diabetes mellitus (CMS/HHS-HCC)      Outpatient Medications Marked as Taking for the 08/20/24 encounter (Office Visit) with Solum, Anna Melissa, MD  Medication Sig Dispense Refill  . acetaminophen  (TYLENOL ) 500 MG tablet Take 1,000 mg by mouth every 6 (six) hours as needed for Pain    . apixaban (ELIQUIS) 2.5 mg tablet Take 1 tablet (2.5 mg total) by mouth every 12 (twelve) hours 180 tablet 3  . atorvastatin  (LIPITOR ) 80 MG tablet TAKE 1 TABLET EVERY DAY 90 tablet 3  . azelastine  (  ASTELIN ) 137 mcg nasal spray Place 2 sprays into both nostrils once daily    . citalopram  (CELEXA ) 10 MG tablet Take 10 mg by mouth every morning.      . cloNIDine  HCL (CATAPRES ) 0.1 MG tablet Take 1 tablet (0.1 mg total) by mouth 2 (two) times daily PRN if Blood Pressure is greater than 175 60 tablet 5  . clopidogrel  (PLAVIX ) 75 mg tablet Take 75 mg by mouth every morning       . cyanocobalamin  (VITAMIN B12) 1,000 mcg SL tablet Take 1,000 mcg by mouth every morning       . dilTIAZem  (CARDIZEM ) 30 MG immediate release tablet Take 1 tablet (30 mg total) by mouth 2 (two) times  daily (Patient taking differently: Take 30 mg by mouth 3 (three) times daily) 180 tablet 3  . empagliflozin (JARDIANCE) 10 mg tablet Take 1 tablet (10 mg total) by mouth once daily 30 tablet 11  . EPINEPHrine  (EPIPEN ) 0.3 mg/0.3 mL auto-injector Inject 0.3 mg into the muscle once as needed for Anaphylaxis    . ferrous sulfate  325 (65 FE) MG EC tablet Take 325 mg by mouth every morning    . fluticasone  propionate (FLONASE ) 50 mcg/actuation nasal spray Place 2 sprays into both nostrils once daily    . hydrALAZINE  (APRESOLINE ) 25 MG tablet Take 1 tablet (25 mg total) by mouth 2 (two) times daily 180 tablet 3  . insulin  ASPART (NOVOLOG  FLEXPEN U-100 INSULIN ) pen injector (concentration 100 units/mL) TAKE UP TO 100 MUNITS DAILY IN DIVIDED DOSES AS ADVISED    . insulin  GLARGINE (LANTUS  SOLOSTAR U-100 INSULIN ) pen injector (concentration 100 units/mL) Inject 60 Units subcutaneously daily    . isosorbide  mononitrate (IMDUR ) 120 MG ER tablet Take 1 tablet (120 mg total) by mouth 2 (two) times daily 180 tablet 1  . magnesium  oxide (MAG-OX) 400 mg (241.3 mg magnesium ) tablet TAKE 1 TABLET BY MOUTH EVERY DAY 120 tablet 2  . methocarbamoL  (ROBAXIN ) 500 MG tablet Take 500 mg by mouth 2 (two) times daily    . metoprolol  SUCCinate (TOPROL -XL) 100 MG XL tablet Take 1 tablet (100 mg total) by mouth once daily 90 tablet 3  . mometasone  (ASMANEX  TWISTHALER) 220 mcg/ actuation (120) inhaler Inhale 1 Puff into the lungs once daily    . montelukast  (SINGULAIR ) 10 mg tablet Take 10 mg by mouth nightly       . nitroGLYcerin  (NITRODUR) 0.4 mg/hr patch PLACE 1 PATCH ON SKIN DAILY. LEAVE PATCH ON FOR 12-14 HOURS, THEN REMOVE FOR 10-12 HOURS PRIOR TO APPLYING THE NEXT PATCH. 90 patch 3  . nitroGLYcerin  (NITROSTAT ) 0.4 MG SL tablet PLACE 1 TABLET UNDER THE TONGUE EVERY 5 MINUTES AS NEEDED FOR CHEST PAIN. MAY TAKE UP TO 3 DOSES. 25 tablet 2  . pantoprazole  (PROTONIX ) 40 MG DR tablet Take 40 mg by mouth 2 (two) times daily    .  ranolazine  (RANEXA ) 500 MG ER tablet TAKE 1 TABLET BY MOUTH TWICE A DAY 180 tablet 3  . tiotropium-olodateroL (STIOLTO RESPIMAT) 2.5-2.5 mcg/actuation inhaler Inhale 1 Puff into the lungs once daily    . torsemide  40 mg Tab Take 40 mg by mouth once for 1 dose (Patient taking differently: Take 40 mg by mouth 2 (two) times daily) 1 tablet 0    Exam: BP 128/60   Pulse 68   Ht 167.6 cm (5' 6)   Wt 84.4 kg (186 lb)   SpO2 97%   BMI 30.02 kg/m  GEN: well developed male  in NAD. SKIN: A bilateral bare foot exam shows no ulcerations or calluses.  EXT: No peripheral edema PSYC: alert and oriented, good insight.  Labs Lab Results  Component Value Date   HGBA1C 7.0 (H) 08/20/2024   HGBA1C 7.5 (H) 05/13/2024    Assessment: 1. Type 2 diabetes mellitus with stage 4 chronic kidney disease, with long-term current use of insulin  (CMS/HHS-HCC)   2. DM type 2 with diabetic peripheral neuropathy (CMS/HHS-HCC)   3. Depression screening (Z13.31)   4. Type 2 diabetes mellitus with microalbuminuria, with long-term current use of insulin  (CMS/HHS-HCC)   5. Type 2 diabetes mellitus with vascular disease (CMS/HHS-HCC)   6. ASCVD (arteriosclerotic cardiovascular disease)     Plan: - Diabetes is controlled. Reviewed target sugars and target Hb A1c. Given complexity of his medical conditions, will aim for Hb A1c in the 7.5 - 8% range. It is critical to avoid hypoglycemia.  - Encouraged him to double check a fingerstick blood sugar whenever his [employer] CGM reports a low reading. - No changes made to his insulins today. - Monitor sugars 4 times daily with Libre 2 flash glucose sensor. Bring Libre to each follow up appt.  - Continue annual dilated eye exams. He is up to date (last 07/2023). - Confirmed he has PRN glucagon prescription. - Continue Jardiance for CHF / CKD.  - Continue regular follow up with GI, Pulmonary, Nephrology, and Cardiology as planned.  - Continue statin.  - No ACE / ARB due to  hyperkalemia.  - Follow up 12 weeks or sooner if needed.

## 2024-08-21 ENCOUNTER — Emergency Department
Admission: EM | Admit: 2024-08-21 | Discharge: 2024-08-21 | Disposition: A | Discharge status: Home / Self Care | Source: Home / Self Care | Attending: Emergency Medicine | Admitting: Emergency Medicine

## 2024-08-21 ENCOUNTER — Other Ambulatory Visit: Payer: Self-pay

## 2024-08-21 ENCOUNTER — Emergency Department

## 2024-08-21 ENCOUNTER — Encounter: Payer: Self-pay | Admitting: Emergency Medicine

## 2024-08-21 DIAGNOSIS — I462 Cardiac arrest due to underlying cardiac condition: Secondary | ICD-10-CM | POA: Diagnosis not present

## 2024-08-21 DIAGNOSIS — I4891 Unspecified atrial fibrillation: Secondary | ICD-10-CM | POA: Insufficient documentation

## 2024-08-21 DIAGNOSIS — R609 Edema, unspecified: Secondary | ICD-10-CM | POA: Diagnosis not present

## 2024-08-21 DIAGNOSIS — J9601 Acute respiratory failure with hypoxia: Secondary | ICD-10-CM | POA: Diagnosis not present

## 2024-08-21 DIAGNOSIS — Z66 Do not resuscitate: Secondary | ICD-10-CM | POA: Diagnosis not present

## 2024-08-21 DIAGNOSIS — R7989 Other specified abnormal findings of blood chemistry: Secondary | ICD-10-CM | POA: Insufficient documentation

## 2024-08-21 DIAGNOSIS — I350 Nonrheumatic aortic (valve) stenosis: Secondary | ICD-10-CM | POA: Diagnosis not present

## 2024-08-21 DIAGNOSIS — R140 Abdominal distension (gaseous): Secondary | ICD-10-CM | POA: Diagnosis not present

## 2024-08-21 DIAGNOSIS — I25119 Atherosclerotic heart disease of native coronary artery with unspecified angina pectoris: Secondary | ICD-10-CM | POA: Diagnosis not present

## 2024-08-21 DIAGNOSIS — E119 Type 2 diabetes mellitus without complications: Secondary | ICD-10-CM | POA: Insufficient documentation

## 2024-08-21 DIAGNOSIS — I472 Ventricular tachycardia, unspecified: Secondary | ICD-10-CM | POA: Diagnosis not present

## 2024-08-21 DIAGNOSIS — I6521 Occlusion and stenosis of right carotid artery: Secondary | ICD-10-CM | POA: Diagnosis not present

## 2024-08-21 DIAGNOSIS — R042 Hemoptysis: Secondary | ICD-10-CM | POA: Diagnosis not present

## 2024-08-21 DIAGNOSIS — I5033 Acute on chronic diastolic (congestive) heart failure: Secondary | ICD-10-CM | POA: Diagnosis not present

## 2024-08-21 DIAGNOSIS — I2510 Atherosclerotic heart disease of native coronary artery without angina pectoris: Secondary | ICD-10-CM | POA: Insufficient documentation

## 2024-08-21 DIAGNOSIS — R072 Precordial pain: Secondary | ICD-10-CM | POA: Insufficient documentation

## 2024-08-21 DIAGNOSIS — J90 Pleural effusion, not elsewhere classified: Secondary | ICD-10-CM | POA: Diagnosis not present

## 2024-08-21 DIAGNOSIS — Z7901 Long term (current) use of anticoagulants: Secondary | ICD-10-CM | POA: Insufficient documentation

## 2024-08-21 DIAGNOSIS — R079 Chest pain, unspecified: Secondary | ICD-10-CM

## 2024-08-21 DIAGNOSIS — R059 Cough, unspecified: Secondary | ICD-10-CM | POA: Diagnosis not present

## 2024-08-21 DIAGNOSIS — R570 Cardiogenic shock: Secondary | ICD-10-CM | POA: Diagnosis not present

## 2024-08-21 DIAGNOSIS — J189 Pneumonia, unspecified organism: Secondary | ICD-10-CM | POA: Diagnosis not present

## 2024-08-21 DIAGNOSIS — I214 Non-ST elevation (NSTEMI) myocardial infarction: Secondary | ICD-10-CM | POA: Diagnosis not present

## 2024-08-21 DIAGNOSIS — Z515 Encounter for palliative care: Secondary | ICD-10-CM | POA: Diagnosis not present

## 2024-08-21 DIAGNOSIS — E1151 Type 2 diabetes mellitus with diabetic peripheral angiopathy without gangrene: Secondary | ICD-10-CM | POA: Diagnosis not present

## 2024-08-21 DIAGNOSIS — Z1152 Encounter for screening for COVID-19: Secondary | ICD-10-CM | POA: Diagnosis not present

## 2024-08-21 DIAGNOSIS — I509 Heart failure, unspecified: Secondary | ICD-10-CM | POA: Insufficient documentation

## 2024-08-21 DIAGNOSIS — I129 Hypertensive chronic kidney disease with stage 1 through stage 4 chronic kidney disease, or unspecified chronic kidney disease: Secondary | ICD-10-CM | POA: Diagnosis not present

## 2024-08-21 DIAGNOSIS — R0789 Other chest pain: Secondary | ICD-10-CM | POA: Diagnosis not present

## 2024-08-21 DIAGNOSIS — I469 Cardiac arrest, cause unspecified: Secondary | ICD-10-CM | POA: Diagnosis not present

## 2024-08-21 DIAGNOSIS — Q212 Atrioventricular septal defect, unspecified as to partial or complete: Secondary | ICD-10-CM | POA: Diagnosis not present

## 2024-08-21 DIAGNOSIS — J9621 Acute and chronic respiratory failure with hypoxia: Secondary | ICD-10-CM | POA: Diagnosis not present

## 2024-08-21 DIAGNOSIS — I4892 Unspecified atrial flutter: Secondary | ICD-10-CM | POA: Diagnosis not present

## 2024-08-21 DIAGNOSIS — R0489 Hemorrhage from other sites in respiratory passages: Secondary | ICD-10-CM | POA: Diagnosis not present

## 2024-08-21 DIAGNOSIS — J440 Chronic obstructive pulmonary disease with acute lower respiratory infection: Secondary | ICD-10-CM | POA: Diagnosis not present

## 2024-08-21 DIAGNOSIS — R918 Other nonspecific abnormal finding of lung field: Secondary | ICD-10-CM | POA: Diagnosis not present

## 2024-08-21 DIAGNOSIS — Z794 Long term (current) use of insulin: Secondary | ICD-10-CM | POA: Diagnosis not present

## 2024-08-21 DIAGNOSIS — Z4682 Encounter for fitting and adjustment of non-vascular catheter: Secondary | ICD-10-CM | POA: Diagnosis not present

## 2024-08-21 DIAGNOSIS — N189 Chronic kidney disease, unspecified: Secondary | ICD-10-CM | POA: Diagnosis not present

## 2024-08-21 DIAGNOSIS — J8410 Pulmonary fibrosis, unspecified: Secondary | ICD-10-CM | POA: Diagnosis not present

## 2024-08-21 DIAGNOSIS — E1122 Type 2 diabetes mellitus with diabetic chronic kidney disease: Secondary | ICD-10-CM | POA: Diagnosis not present

## 2024-08-21 DIAGNOSIS — I130 Hypertensive heart and chronic kidney disease with heart failure and stage 1 through stage 4 chronic kidney disease, or unspecified chronic kidney disease: Secondary | ICD-10-CM | POA: Diagnosis not present

## 2024-08-21 DIAGNOSIS — E669 Obesity, unspecified: Secondary | ICD-10-CM | POA: Diagnosis not present

## 2024-08-21 DIAGNOSIS — N184 Chronic kidney disease, stage 4 (severe): Secondary | ICD-10-CM | POA: Diagnosis not present

## 2024-08-21 DIAGNOSIS — I499 Cardiac arrhythmia, unspecified: Secondary | ICD-10-CM | POA: Diagnosis not present

## 2024-08-21 DIAGNOSIS — J811 Chronic pulmonary edema: Secondary | ICD-10-CM | POA: Diagnosis not present

## 2024-08-21 DIAGNOSIS — R0602 Shortness of breath: Secondary | ICD-10-CM | POA: Diagnosis not present

## 2024-08-21 DIAGNOSIS — J9690 Respiratory failure, unspecified, unspecified whether with hypoxia or hypercapnia: Secondary | ICD-10-CM | POA: Diagnosis not present

## 2024-08-21 LAB — CBC WITH DIFFERENTIAL/PLATELET
Abs Immature Granulocytes: 0.07 K/uL (ref 0.00–0.07)
Basophils Absolute: 0.1 K/uL (ref 0.0–0.1)
Basophils Relative: 1 %
Eosinophils Absolute: 0.7 K/uL — ABNORMAL HIGH (ref 0.0–0.5)
Eosinophils Relative: 6 %
HCT: 32.1 % — ABNORMAL LOW (ref 39.0–52.0)
Hemoglobin: 10.7 g/dL — ABNORMAL LOW (ref 13.0–17.0)
Immature Granulocytes: 1 %
Lymphocytes Relative: 9 %
Lymphs Abs: 1 K/uL (ref 0.7–4.0)
MCH: 32.8 pg (ref 26.0–34.0)
MCHC: 33.3 g/dL (ref 30.0–36.0)
MCV: 98.5 fL (ref 80.0–100.0)
Monocytes Absolute: 0.9 K/uL (ref 0.1–1.0)
Monocytes Relative: 9 %
Neutro Abs: 7.7 K/uL (ref 1.7–7.7)
Neutrophils Relative %: 74 %
Platelets: 236 K/uL (ref 150–400)
RBC: 3.26 MIL/uL — ABNORMAL LOW (ref 4.22–5.81)
RDW: 14.5 % (ref 11.5–15.5)
WBC: 10.4 K/uL (ref 4.0–10.5)
nRBC: 0 % (ref 0.0–0.2)

## 2024-08-21 LAB — COMPREHENSIVE METABOLIC PANEL WITH GFR
ALT: 13 U/L (ref 0–44)
AST: 18 U/L (ref 15–41)
Albumin: 3.6 g/dL (ref 3.5–5.0)
Alkaline Phosphatase: 50 U/L (ref 38–126)
Anion gap: 12 (ref 5–15)
BUN: 71 mg/dL — ABNORMAL HIGH (ref 8–23)
CO2: 24 mmol/L (ref 22–32)
Calcium: 8.4 mg/dL — ABNORMAL LOW (ref 8.9–10.3)
Chloride: 103 mmol/L (ref 98–111)
Creatinine, Ser: 2.95 mg/dL — ABNORMAL HIGH (ref 0.61–1.24)
GFR, Estimated: 20 mL/min — ABNORMAL LOW (ref >60)
Glucose, Bld: 211 mg/dL — ABNORMAL HIGH (ref 70–99)
Potassium: 3.5 mmol/L (ref 3.5–5.1)
Sodium: 139 mmol/L (ref 135–145)
Total Bilirubin: 0.8 mg/dL (ref 0.0–1.2)
Total Protein: 6.4 g/dL — ABNORMAL LOW (ref 6.5–8.1)

## 2024-08-21 LAB — LIPASE, BLOOD: Lipase: 55 U/L — ABNORMAL HIGH (ref 11–51)

## 2024-08-21 LAB — TROPONIN I (HIGH SENSITIVITY)
Troponin I (High Sensitivity): 33 ng/L — ABNORMAL HIGH (ref <18)
Troponin I (High Sensitivity): 34 ng/L — ABNORMAL HIGH (ref <18)

## 2024-08-21 MED ORDER — LIDOCAINE VISCOUS HCL 2 % MT SOLN
15.0000 mL | Freq: Once | OROMUCOSAL | Status: AC
  Administered 2024-08-21: 15 mL via ORAL
  Filled 2024-08-21: qty 15

## 2024-08-21 MED ORDER — PANTOPRAZOLE SODIUM 40 MG IV SOLR
40.0000 mg | Freq: Once | INTRAVENOUS | Status: AC
  Administered 2024-08-21: 40 mg via INTRAVENOUS
  Filled 2024-08-21: qty 10

## 2024-08-21 MED ORDER — ALUM & MAG HYDROXIDE-SIMETH 200-200-20 MG/5ML PO SUSP
30.0000 mL | Freq: Once | ORAL | Status: AC
  Administered 2024-08-21: 30 mL via ORAL
  Filled 2024-08-21: qty 30

## 2024-08-21 MED ORDER — NITROGLYCERIN 0.4 MG SL SUBL: 0.4000 mg | SUBLINGUAL_TABLET | SUBLINGUAL | Status: DC | PRN

## 2024-08-21 MED ORDER — ASPIRIN 81 MG PO CHEW: 324.0000 mg | CHEWABLE_TABLET | Freq: Once | ORAL | Status: DC

## 2024-08-21 NOTE — ED Triage Notes (Signed)
 Pt presents to the ED via [facility] with complaints of CP that started last night. Per EMS, pt has taken his supplied nitroglycerin  and was provided nitroglycerin  by EMS with a total of 6 nitros PTA. Rates the pain 8/10. A&Ox4 at this time. Denies dizzines or SOB.

## 2024-08-21 NOTE — Discharge Instructions (Addendum)
 Fortunately your evaluation in the emergency department did not show any emergency conditions like heart attack that account for your pain tonight.  Call your primary doctor and your cardiologist to notify them of your ongoing symptoms and to schedule for an appointment  Thank you for choosing us  for your health care today!  Please see your primary doctor this week for a follow up appointment.   If you have any new, worsening, or unexpected symptoms call your doctor right away or come back to the emergency department for reevaluation.  It was my pleasure to care for you today.   Ginnie EDISON Cyrena, MD

## 2024-08-21 NOTE — ED Provider Notes (Signed)
 [facility] Provider Note    Event Date/Time   First MD Initiated Contact with Patient 08/21/24 0448     (approximate)   History   Chest Pain   HPI  Mike Decker is a 83 y.o. male   Past medical history of CAD, CHF, atrial fibrillation on anticoagulation, peripheral vascular disease, diabetes who presents emergency department with substernal chest pain rating to the left shoulder.  Started at rest last night, took 2 nitros and the pain subsided.  He went to bed.  He awoke with worsening chest pain.  He called EMS.  He took a full dose of aspirin  prior to coming to the hospital.  Ongoing central chest pain with radiation to the left shoulder.  Moderate dull aching pain currently.  Denies shortness of breath.  Denies swelling.  Has been fully compliant with all medications including his anticoagulation last dosed in the evening.  Denies preceding illnesses, denies respiratory infectious symptoms.  Was feeling well until last evening when the onset of chest pain started.  External Medical Documents Reviewed: Outpatient cardiology notes      Physical Exam   Triage Vital Signs: ED Triage Vitals  Encounter Vitals Group     BP 08/21/24 0446 (!) 138/49     Girls Systolic BP Percentile --      Girls Diastolic BP Percentile --      Boys Systolic BP Percentile --      Boys Diastolic BP Percentile --      Pulse Rate 08/21/24 0446 69     Resp 08/21/24 0446 18     Temp 08/21/24 0446 97.9 F (36.6 C)     Temp Source 08/21/24 0446 Oral     SpO2 08/21/24 0446 100 %     Weight 08/21/24 0445 191 lb 2.2 oz (86.7 kg)     Height 08/21/24 0445 5' 6 (1.676 m)     Head Circumference --      Peak Flow --      Pain Score 08/21/24 0446 8     Pain Loc --      Pain Education --      Exclude from Growth Chart --     Most recent vital signs: Vitals:   08/21/24 0446 08/21/24 0610  BP: (!) 138/49 138/66  Pulse: 69 64  Resp: 18 (!) 22  Temp: 97.9 F (36.6  C)   SpO2: 100% 95%    General: Awake, no distress.  CV:  Good peripheral perfusion.  Resp:  Normal effort.  Abd:  No distention.  Other:  Awake appropriate pleasant gentleman resting in the stretcher with normal vital signs and breathing comfortably on room air.  Clear lungs without focality wheezing or rales in all lung fields.  Soft benign abdominal exam, nonperitoneal nonfocal.  Skin appears warm well-perfused, euvolemic overall with no significant peripheral edema noted.  Radial pulses intact and equal bilateral wrists.   ED Results / Procedures / Treatments   Labs (all labs ordered are listed, but only abnormal results are displayed) Labs Reviewed  COMPREHENSIVE METABOLIC PANEL WITH GFR - Abnormal; Notable for the following components:      Result Value   Glucose, Bld 211 (*)    BUN 71 (*)    Creatinine, Ser 2.95 (*)    Calcium  8.4 (*)    Total Protein 6.4 (*)    GFR, Estimated 20 (*)    All other components within normal limits  CBC WITH DIFFERENTIAL/PLATELET - Abnormal;  Notable for the following components:   RBC 3.26 (*)    Hemoglobin 10.7 (*)    HCT 32.1 (*)    Eosinophils Absolute 0.7 (*)    All other components within normal limits  LIPASE, BLOOD - Abnormal; Notable for the following components:   Lipase 55 (*)    All other components within normal limits  TROPONIN I (HIGH SENSITIVITY) - Abnormal; Notable for the following components:   Troponin I (High Sensitivity) 33 (*)    All other components within normal limits  TROPONIN I (HIGH SENSITIVITY)     I ordered and reviewed the above labs they are notable for poor kidney function appears at baseline, mildly elevated troponin initially at 33 is consistent with baseline  EKG  ED ECG REPORT I, Ginnie Shams, the attending physician, personally viewed and interpreted this ECG.   Date: 08/21/2024  EKG Time: 0447  Rate: 71  Rhythm: sinus  Axis: nl  Intervals:nl   ST&T Change: no stemi,  +PVC    RADIOLOGY I independently reviewed and interpreted chest x-ray and I see no obvious focality nor pneumothorax I also reviewed radiologist's formal read.   PROCEDURES:  Critical Care performed: No  Procedures   MEDICATIONS ORDERED IN ED: Medications  nitroGLYCERIN  (NITROSTAT ) SL tablet 0.4 mg (has no administration in time range)  alum & mag hydroxide-simeth (MAALOX/MYLANTA) 200-200-20 MG/5ML suspension 30 mL (30 mLs Oral Given 08/21/24 9394)    And  lidocaine  (XYLOCAINE ) 2 % viscous mouth solution 15 mL (15 mLs Oral Given 08/21/24 0605)  pantoprazole  (PROTONIX ) injection 40 mg (40 mg Intravenous Given 08/21/24 9394)    IMPRESSION / MDM / ASSESSMENT AND PLAN / ED COURSE  I reviewed the triage vital signs and the nursing notes.                                Patient's presentation is most consistent with acute presentation with potential threat to life or bodily function.  Differential diagnosis includes, but is not limited to, ACS, PE, dissection, musculoskeletal pain, respiratory infection, esophagitis  The patient is on the cardiac monitor to evaluate for evidence of arrhythmia and/or significant heart rate changes.  MDM:    83 year old with a cardiac history with symptoms concerning for ACS.  Fortunately EKG shows no STEMI.  With risk factors and acuity of onset think best to follow serial troponins.  Already got a full dose of aspirin  just prior to arrival, will give nitro as needed for pain (last hospitalization in July had only mildly elevated troponins from 43-51 thought by cardiology to be consistent with demand ischemia in the setting of uncontrolled blood pressure.)  I considered PE but I think less likely given his full compliance with anticoagulation and no significant shortness of breath.    Symptoms not matching with CHF exacerbation, nor does the clinical exam match with CHF exacerbation.    No respiratory infectious symptoms to suggest  infection.  Description of symptoms not consistent with dissection, no pulse deficits on exam, do not think this is a likely diagnosis at this time.  Etiology could be GI related as he has a history of Barrett's esophagus, will give GI cocktail.         FINAL CLINICAL IMPRESSION(S) / ED DIAGNOSES   Final diagnoses:  Nonspecific chest pain     Rx / DC Orders   ED Discharge Orders     None  Note:  This document was prepared using Dragon voice recognition software and may include unintentional dictation errors.    Cyrena Mylar, MD 08/21/24 [phone]

## 2024-08-22 ENCOUNTER — Inpatient Hospital Stay

## 2024-08-22 ENCOUNTER — Other Ambulatory Visit: Payer: Self-pay

## 2024-08-22 ENCOUNTER — Inpatient Hospital Stay
Admission: EM | Admit: 2024-08-22 | Discharge: 2024-09-09 | DRG: 981 | Disposition: E | Attending: Pulmonary Disease | Admitting: Pulmonary Disease

## 2024-08-22 ENCOUNTER — Emergency Department

## 2024-08-22 DIAGNOSIS — Z85828 Personal history of other malignant neoplasm of skin: Secondary | ICD-10-CM

## 2024-08-22 DIAGNOSIS — Z7902 Long term (current) use of antithrombotics/antiplatelets: Secondary | ICD-10-CM

## 2024-08-22 DIAGNOSIS — R140 Abdominal distension (gaseous): Secondary | ICD-10-CM | POA: Diagnosis not present

## 2024-08-22 DIAGNOSIS — Z85820 Personal history of malignant melanoma of skin: Secondary | ICD-10-CM

## 2024-08-22 DIAGNOSIS — R570 Cardiogenic shock: Secondary | ICD-10-CM | POA: Diagnosis present

## 2024-08-22 DIAGNOSIS — Z8719 Personal history of other diseases of the digestive system: Secondary | ICD-10-CM

## 2024-08-22 DIAGNOSIS — Z860100 Personal history of colon polyps, unspecified: Secondary | ICD-10-CM

## 2024-08-22 DIAGNOSIS — Z87891 Personal history of nicotine dependence: Secondary | ICD-10-CM

## 2024-08-22 DIAGNOSIS — M109 Gout, unspecified: Secondary | ICD-10-CM | POA: Diagnosis present

## 2024-08-22 DIAGNOSIS — E1151 Type 2 diabetes mellitus with diabetic peripheral angiopathy without gangrene: Secondary | ICD-10-CM | POA: Diagnosis present

## 2024-08-22 DIAGNOSIS — I214 Non-ST elevation (NSTEMI) myocardial infarction: Secondary | ICD-10-CM | POA: Diagnosis present

## 2024-08-22 DIAGNOSIS — Z8546 Personal history of malignant neoplasm of prostate: Secondary | ICD-10-CM

## 2024-08-22 DIAGNOSIS — I5033 Acute on chronic diastolic (congestive) heart failure: Secondary | ICD-10-CM | POA: Diagnosis not present

## 2024-08-22 DIAGNOSIS — Z515 Encounter for palliative care: Secondary | ICD-10-CM

## 2024-08-22 DIAGNOSIS — I472 Ventricular tachycardia, unspecified: Secondary | ICD-10-CM | POA: Diagnosis not present

## 2024-08-22 DIAGNOSIS — I2510 Atherosclerotic heart disease of native coronary artery without angina pectoris: Secondary | ICD-10-CM | POA: Diagnosis present

## 2024-08-22 DIAGNOSIS — I350 Nonrheumatic aortic (valve) stenosis: Secondary | ICD-10-CM | POA: Diagnosis present

## 2024-08-22 DIAGNOSIS — Q212 Atrioventricular septal defect, unspecified as to partial or complete: Secondary | ICD-10-CM

## 2024-08-22 DIAGNOSIS — Z66 Do not resuscitate: Secondary | ICD-10-CM | POA: Diagnosis present

## 2024-08-22 DIAGNOSIS — J90 Pleural effusion, not elsewhere classified: Secondary | ICD-10-CM | POA: Diagnosis not present

## 2024-08-22 DIAGNOSIS — Z823 Family history of stroke: Secondary | ICD-10-CM

## 2024-08-22 DIAGNOSIS — Z955 Presence of coronary angioplasty implant and graft: Secondary | ICD-10-CM

## 2024-08-22 DIAGNOSIS — J189 Pneumonia, unspecified organism: Secondary | ICD-10-CM | POA: Diagnosis present

## 2024-08-22 DIAGNOSIS — J9621 Acute and chronic respiratory failure with hypoxia: Secondary | ICD-10-CM | POA: Diagnosis not present

## 2024-08-22 DIAGNOSIS — R0489 Hemorrhage from other sites in respiratory passages: Secondary | ICD-10-CM | POA: Diagnosis present

## 2024-08-22 DIAGNOSIS — R042 Hemoptysis: Secondary | ICD-10-CM | POA: Diagnosis not present

## 2024-08-22 DIAGNOSIS — J8410 Pulmonary fibrosis, unspecified: Secondary | ICD-10-CM | POA: Diagnosis not present

## 2024-08-22 DIAGNOSIS — E785 Hyperlipidemia, unspecified: Secondary | ICD-10-CM | POA: Diagnosis present

## 2024-08-22 DIAGNOSIS — J440 Chronic obstructive pulmonary disease with acute lower respiratory infection: Secondary | ICD-10-CM | POA: Diagnosis present

## 2024-08-22 DIAGNOSIS — G4733 Obstructive sleep apnea (adult) (pediatric): Secondary | ICD-10-CM | POA: Diagnosis present

## 2024-08-22 DIAGNOSIS — I462 Cardiac arrest due to underlying cardiac condition: Secondary | ICD-10-CM | POA: Diagnosis not present

## 2024-08-22 DIAGNOSIS — Z1152 Encounter for screening for COVID-19: Secondary | ICD-10-CM | POA: Diagnosis not present

## 2024-08-22 DIAGNOSIS — E669 Obesity, unspecified: Secondary | ICD-10-CM | POA: Diagnosis present

## 2024-08-22 DIAGNOSIS — Z794 Long term (current) use of insulin: Secondary | ICD-10-CM

## 2024-08-22 DIAGNOSIS — I4892 Unspecified atrial flutter: Secondary | ICD-10-CM | POA: Diagnosis present

## 2024-08-22 DIAGNOSIS — I6521 Occlusion and stenosis of right carotid artery: Secondary | ICD-10-CM | POA: Diagnosis present

## 2024-08-22 DIAGNOSIS — J9690 Respiratory failure, unspecified, unspecified whether with hypoxia or hypercapnia: Secondary | ICD-10-CM | POA: Diagnosis not present

## 2024-08-22 DIAGNOSIS — E1122 Type 2 diabetes mellitus with diabetic chronic kidney disease: Secondary | ICD-10-CM | POA: Diagnosis present

## 2024-08-22 DIAGNOSIS — Z4682 Encounter for fitting and adjustment of non-vascular catheter: Secondary | ICD-10-CM | POA: Diagnosis not present

## 2024-08-22 DIAGNOSIS — N184 Chronic kidney disease, stage 4 (severe): Secondary | ICD-10-CM | POA: Diagnosis present

## 2024-08-22 DIAGNOSIS — R0602 Shortness of breath: Secondary | ICD-10-CM | POA: Diagnosis not present

## 2024-08-22 DIAGNOSIS — Z79899 Other long term (current) drug therapy: Secondary | ICD-10-CM

## 2024-08-22 DIAGNOSIS — Z7901 Long term (current) use of anticoagulants: Secondary | ICD-10-CM

## 2024-08-22 DIAGNOSIS — R059 Cough, unspecified: Secondary | ICD-10-CM | POA: Diagnosis not present

## 2024-08-22 DIAGNOSIS — Z880 Allergy status to penicillin: Secondary | ICD-10-CM

## 2024-08-22 DIAGNOSIS — I4891 Unspecified atrial fibrillation: Secondary | ICD-10-CM | POA: Diagnosis present

## 2024-08-22 DIAGNOSIS — J9601 Acute respiratory failure with hypoxia: Secondary | ICD-10-CM | POA: Diagnosis present

## 2024-08-22 DIAGNOSIS — R079 Chest pain, unspecified: Secondary | ICD-10-CM | POA: Diagnosis not present

## 2024-08-22 DIAGNOSIS — K219 Gastro-esophageal reflux disease without esophagitis: Secondary | ICD-10-CM | POA: Diagnosis present

## 2024-08-22 DIAGNOSIS — I252 Old myocardial infarction: Secondary | ICD-10-CM

## 2024-08-22 DIAGNOSIS — I130 Hypertensive heart and chronic kidney disease with heart failure and stage 1 through stage 4 chronic kidney disease, or unspecified chronic kidney disease: Secondary | ICD-10-CM | POA: Diagnosis present

## 2024-08-22 DIAGNOSIS — Z888 Allergy status to other drugs, medicaments and biological substances status: Secondary | ICD-10-CM

## 2024-08-22 DIAGNOSIS — Z887 Allergy status to serum and vaccine status: Secondary | ICD-10-CM

## 2024-08-22 DIAGNOSIS — Z8261 Family history of arthritis: Secondary | ICD-10-CM

## 2024-08-22 DIAGNOSIS — R918 Other nonspecific abnormal finding of lung field: Secondary | ICD-10-CM | POA: Diagnosis not present

## 2024-08-22 DIAGNOSIS — Z7984 Long term (current) use of oral hypoglycemic drugs: Secondary | ICD-10-CM

## 2024-08-22 DIAGNOSIS — J811 Chronic pulmonary edema: Secondary | ICD-10-CM | POA: Diagnosis not present

## 2024-08-22 DIAGNOSIS — N189 Chronic kidney disease, unspecified: Secondary | ICD-10-CM | POA: Diagnosis not present

## 2024-08-22 DIAGNOSIS — I129 Hypertensive chronic kidney disease with stage 1 through stage 4 chronic kidney disease, or unspecified chronic kidney disease: Secondary | ICD-10-CM | POA: Diagnosis not present

## 2024-08-22 LAB — GLUCOSE, CAPILLARY
Glucose-Capillary: 160 mg/dL — ABNORMAL HIGH (ref 70–99)
Glucose-Capillary: 163 mg/dL — ABNORMAL HIGH (ref 70–99)
Glucose-Capillary: 166 mg/dL — ABNORMAL HIGH (ref 70–99)
Glucose-Capillary: 186 mg/dL — ABNORMAL HIGH (ref 70–99)
Glucose-Capillary: 211 mg/dL — ABNORMAL HIGH (ref 70–99)
Glucose-Capillary: 221 mg/dL — ABNORMAL HIGH (ref 70–99)

## 2024-08-22 LAB — RESPIRATORY PANEL BY PCR

## 2024-08-22 LAB — COMPREHENSIVE METABOLIC PANEL WITH GFR
ALT: 14 U/L (ref 0–44)
AST: 20 U/L (ref 15–41)
Albumin: 3.6 g/dL (ref 3.5–5.0)
Alkaline Phosphatase: 48 U/L (ref 38–126)
Anion gap: 13 (ref 5–15)
BUN: 70 mg/dL — ABNORMAL HIGH (ref 8–23)
CO2: 22 mmol/L (ref 22–32)
Calcium: 8.4 mg/dL — ABNORMAL LOW (ref 8.9–10.3)
Chloride: 103 mmol/L (ref 98–111)
Creatinine, Ser: 2.88 mg/dL — ABNORMAL HIGH (ref 0.61–1.24)
GFR, Estimated: 21 mL/min — ABNORMAL LOW (ref >60)
Glucose, Bld: 206 mg/dL — ABNORMAL HIGH (ref 70–99)
Potassium: 4.2 mmol/L (ref 3.5–5.1)
Sodium: 138 mmol/L (ref 135–145)
Total Bilirubin: 0.9 mg/dL (ref 0.0–1.2)
Total Protein: 6.4 g/dL — ABNORMAL LOW (ref 6.5–8.1)

## 2024-08-22 LAB — PHOSPHORUS: Phosphorus: 3.3 mg/dL (ref 2.5–4.6)

## 2024-08-22 LAB — CBC WITH DIFFERENTIAL/PLATELET
Abs Immature Granulocytes: 0.09 K/uL — ABNORMAL HIGH (ref 0.00–0.07)
Basophils Absolute: 0.1 K/uL (ref 0.0–0.1)
Basophils Relative: 0 %
Eosinophils Absolute: 0.6 K/uL — ABNORMAL HIGH (ref 0.0–0.5)
Eosinophils Relative: 5 %
HCT: 32.3 % — ABNORMAL LOW (ref 39.0–52.0)
Hemoglobin: 10.3 g/dL — ABNORMAL LOW (ref 13.0–17.0)
Immature Granulocytes: 1 %
Lymphocytes Relative: 9 %
Lymphs Abs: 1 K/uL (ref 0.7–4.0)
MCH: 31.8 pg (ref 26.0–34.0)
MCHC: 31.9 g/dL (ref 30.0–36.0)
MCV: 99.7 fL (ref 80.0–100.0)
Monocytes Absolute: 0.9 K/uL (ref 0.1–1.0)
Monocytes Relative: 8 %
Neutro Abs: 8.6 K/uL — ABNORMAL HIGH (ref 1.7–7.7)
Neutrophils Relative %: 77 %
Platelets: 243 K/uL (ref 150–400)
RBC: 3.24 MIL/uL — ABNORMAL LOW (ref 4.22–5.81)
RDW: 14.6 % (ref 11.5–15.5)
WBC: 11.2 K/uL — ABNORMAL HIGH (ref 4.0–10.5)
nRBC: 0 % (ref 0.0–0.2)

## 2024-08-22 LAB — RESP PANEL BY RT-PCR (RSV, FLU A&B, COVID)  RVPGX2
Influenza A by PCR: NEGATIVE
Influenza B by PCR: NEGATIVE
Resp Syncytial Virus by PCR: NEGATIVE
SARS Coronavirus 2 by RT PCR: NEGATIVE

## 2024-08-22 LAB — PROCALCITONIN: Procalcitonin: 0.11 ng/mL

## 2024-08-22 LAB — BASIC METABOLIC PANEL WITH GFR
Anion gap: 9 (ref 5–15)
BUN: 73 mg/dL — ABNORMAL HIGH (ref 8–23)
CO2: 24 mmol/L (ref 22–32)
Calcium: 8.2 mg/dL — ABNORMAL LOW (ref 8.9–10.3)
Chloride: 106 mmol/L (ref 98–111)
Creatinine, Ser: 2.98 mg/dL — ABNORMAL HIGH (ref 0.61–1.24)
GFR, Estimated: 20 mL/min — ABNORMAL LOW (ref >60)
Glucose, Bld: 213 mg/dL — ABNORMAL HIGH (ref 70–99)
Potassium: 4.5 mmol/L (ref 3.5–5.1)
Sodium: 139 mmol/L (ref 135–145)

## 2024-08-22 LAB — STREP PNEUMONIAE URINARY ANTIGEN: Strep Pneumo Urinary Antigen: NEGATIVE

## 2024-08-22 LAB — CBC
HCT: 30.4 % — ABNORMAL LOW (ref 39.0–52.0)
Hemoglobin: 9.9 g/dL — ABNORMAL LOW (ref 13.0–17.0)
MCH: 32 pg (ref 26.0–34.0)
MCHC: 32.6 g/dL (ref 30.0–36.0)
MCV: 98.4 fL (ref 80.0–100.0)
Platelets: 219 K/uL (ref 150–400)
RBC: 3.09 MIL/uL — ABNORMAL LOW (ref 4.22–5.81)
RDW: 14.9 % (ref 11.5–15.5)
WBC: 10.5 K/uL (ref 4.0–10.5)
nRBC: 0 % (ref 0.0–0.2)

## 2024-08-22 LAB — HEMOGLOBIN A1C
Hgb A1c MFr Bld: 6.6 % — ABNORMAL HIGH (ref 4.8–5.6)
Mean Plasma Glucose: 142.72 mg/dL

## 2024-08-22 LAB — PROTIME-INR
INR: 1 (ref 0.8–1.2)
Prothrombin Time: 13.8 s (ref 11.4–15.2)

## 2024-08-22 LAB — TROPONIN I (HIGH SENSITIVITY)
Troponin I (High Sensitivity): 53 ng/L — ABNORMAL HIGH (ref <18)
Troponin I (High Sensitivity): 70 ng/L — ABNORMAL HIGH (ref <18)
Troponin I (High Sensitivity): 132 ng/L (ref <18)
Troponin I (High Sensitivity): 148 ng/L (ref <18)
Troponin I (High Sensitivity): 139 ng/L (ref <18)

## 2024-08-22 LAB — TYPE AND SCREEN
ABO/RH(D): A POS
Antibody Screen: NEGATIVE

## 2024-08-22 LAB — BRAIN NATRIURETIC PEPTIDE: B Natriuretic Peptide: 344.3 pg/mL — ABNORMAL HIGH (ref 0.0–100.0)

## 2024-08-22 LAB — MAGNESIUM: Magnesium: 2.2 mg/dL (ref 1.7–2.4)

## 2024-08-22 LAB — LACTIC ACID, PLASMA: Lactic Acid, Venous: 1.7 mmol/L (ref 0.5–1.9)

## 2024-08-22 LAB — MRSA NEXT GEN BY PCR, NASAL: MRSA by PCR Next Gen: NOT DETECTED

## 2024-08-22 LAB — BLOOD GAS, VENOUS

## 2024-08-22 LAB — LIPASE, BLOOD: Lipase: 56 U/L — ABNORMAL HIGH (ref 11–51)

## 2024-08-22 MED ORDER — TRANEXAMIC ACID FOR INHALATION
500.0000 mg | Freq: Three times a day (TID) | RESPIRATORY_TRACT | Status: DC
  Administered 2024-08-22 (×2): 500 mg via RESPIRATORY_TRACT
  Filled 2024-08-22: qty 5

## 2024-08-22 MED ORDER — CHLORHEXIDINE GLUCONATE CLOTH 2 % EX PADS
6.0000 | MEDICATED_PAD | Freq: Every day | CUTANEOUS | Status: DC
  Administered 2024-08-22: 6 via TOPICAL

## 2024-08-22 MED ORDER — PANTOPRAZOLE SODIUM 40 MG IV SOLR
40.0000 mg | Freq: Two times a day (BID) | INTRAVENOUS | Status: DC
  Administered 2024-08-22 (×2): 40 mg via INTRAVENOUS
  Filled 2024-08-22 (×2): qty 10

## 2024-08-22 MED ORDER — SODIUM CHLORIDE 0.9 % IV SOLN
1.0000 g | INTRAVENOUS | Status: DC
  Administered 2024-08-22 – 2024-08-23 (×2): 1 g via INTRAVENOUS
  Filled 2024-08-22 (×2): qty 10

## 2024-08-22 MED ORDER — OXYCODONE HCL 5 MG PO TABS
5.0000 mg | ORAL_TABLET | Freq: Four times a day (QID) | ORAL | Status: DC | PRN
  Administered 2024-08-22: 5 mg via ORAL
  Filled 2024-08-22: qty 1

## 2024-08-22 MED ORDER — ONDANSETRON HCL 4 MG/2ML IJ SOLN
4.0000 mg | INTRAMUSCULAR | Status: AC
  Administered 2024-08-22: 4 mg via INTRAVENOUS
  Filled 2024-08-22: qty 2

## 2024-08-22 MED ORDER — TRAZODONE HCL 50 MG PO TABS
50.0000 mg | ORAL_TABLET | Freq: Every day | ORAL | Status: DC
  Administered 2024-08-22: 50 mg via ORAL
  Filled 2024-08-22: qty 1

## 2024-08-22 MED ORDER — SODIUM CHLORIDE 0.9 % IV SOLN
500.0000 mg | INTRAVENOUS | Status: DC
  Administered 2024-08-22 – 2024-08-23 (×2): 500 mg via INTRAVENOUS
  Filled 2024-08-22 (×2): qty 5

## 2024-08-22 MED ORDER — TRANEXAMIC ACID-NACL 1000-0.7 MG/100ML-% IV SOLN
1000.0000 mg | INTRAVENOUS | Status: AC
  Administered 2024-08-22: 1000 mg via INTRAVENOUS
  Filled 2024-08-22: qty 100

## 2024-08-22 MED ORDER — ORAL CARE MOUTH RINSE: 15.0000 mL | OROMUCOSAL | Status: DC | PRN

## 2024-08-22 MED ORDER — INSULIN ASPART 100 UNIT/ML IJ SOLN
0.0000 [IU] | INTRAMUSCULAR | Status: DC
  Administered 2024-08-22 (×4): 3 [IU] via SUBCUTANEOUS
  Administered 2024-08-22: 5 [IU] via SUBCUTANEOUS
  Administered 2024-08-23: 15 [IU] via SUBCUTANEOUS
  Administered 2024-08-23: 2 [IU] via SUBCUTANEOUS
  Filled 2024-08-22 (×8): qty 1

## 2024-08-22 MED ORDER — MORPHINE SULFATE (PF) 2 MG/ML IV SOLN
1.0000 mg | INTRAVENOUS | Status: DC | PRN
  Administered 2024-08-22 – 2024-08-23 (×2): 2 mg via INTRAVENOUS
  Filled 2024-08-22 (×2): qty 1

## 2024-08-22 MED ORDER — IPRATROPIUM-ALBUTEROL 0.5-2.5 (3) MG/3ML IN SOLN: 3.0000 mL | Freq: Four times a day (QID) | RESPIRATORY_TRACT | Status: DC | PRN

## 2024-08-22 MED ORDER — IPRATROPIUM-ALBUTEROL 0.5-2.5 (3) MG/3ML IN SOLN
3.0000 mL | Freq: Four times a day (QID) | RESPIRATORY_TRACT | Status: DC
  Administered 2024-08-22 (×2): 3 mL via RESPIRATORY_TRACT
  Filled 2024-08-22 (×2): qty 3

## 2024-08-22 MED ORDER — DOCUSATE SODIUM 100 MG PO CAPS: 100.0000 mg | ORAL_CAPSULE | Freq: Two times a day (BID) | ORAL | Status: DC | PRN

## 2024-08-22 MED ORDER — POLYETHYLENE GLYCOL 3350 17 G PO PACK: 17.0000 g | PACK | Freq: Every day | ORAL | Status: DC | PRN

## 2024-08-22 MED ORDER — IPRATROPIUM-ALBUTEROL 0.5-2.5 (3) MG/3ML IN SOLN
3.0000 mL | Freq: Three times a day (TID) | RESPIRATORY_TRACT | Status: DC
Start: 2024-08-22 — End: 2024-08-23
  Administered 2024-08-22 – 2024-08-23 (×3): 3 mL via RESPIRATORY_TRACT
  Filled 2024-08-22 (×3): qty 3

## 2024-08-22 MED ORDER — CHLORHEXIDINE GLUCONATE CLOTH 2 % EX PADS
6.0000 | MEDICATED_PAD | Freq: Every day | CUTANEOUS | Status: DC
  Filled 2024-08-22: qty 6

## 2024-08-22 NOTE — Progress Notes (Signed)
 eLink Physician-Brief Progress Note Patient Name: Mike Decker DOB: 1941-04-28 MRN: [id number]   Date of Service  08/22/2024  HPI/Events of Note  Patient admitted with shortness of breath and hemoptysis of unclear etiology, he is on Elquis, work up in progress.  eICU Interventions  New Patient Evaluation.        Jenness Stemler U Graeson Nouri 08/22/2024, 6:32 AM

## 2024-08-22 NOTE — Consult Note (Signed)
 PHARMACY CONSULT NOTE - ELECTROLYTES  Pharmacy Consult for Electrolyte Monitoring and Replacement   Recent Labs: Height: 5' 6 (167.6 cm) Weight: 82.7 kg (182 lb 5.1 oz) IBW/kg (Calculated) : 63.8 Estimated Creatinine Clearance: 18.6 mL/min (A) (by C-G formula based on SCr of 2.98 mg/dL (H)). Potassium (mmol/L)  Date Value  08/22/2024 4.5  11/03/2014 4.6   Magnesium  (mg/dL)  Date Value  90/86/7974 2.2   Calcium  (mg/dL)  Date Value  90/86/7974 8.2 (L)   Calcium , Total (mg/dL)  Date Value  88/74/7984 8.9   Albumin  (g/dL)  Date Value  90/86/7974 3.6   Phosphorus (mg/dL)  Date Value  90/86/7974 3.3   Sodium (mmol/L)  Date Value  08/22/2024 139  11/03/2014 139    Assessment  Mike Decker is a 83 y.o. male presenting with shortness of breath and hemoptysis. PMH significant for Barrett's esophagus, Esophageal stricture s/p dilation 2024, CAD s/p PCI w/DES RCA, prox LCx, HFpEF, Atrial Fibrillation on Eliquis, CKD stage IV, T2DM, carotid stenosis, COPD, OSA on CPAP and prostate cancer . Pharmacy has been consulted to monitor and replace electrolytes.  Diet: NPO MIVF: N/A Pertinent medications: N/A  Goal of Therapy: Electrolytes WNL  Plan:  No replacement indicated Check BMP, Mg, Phos with AM labs  Thank you for allowing pharmacy to be a part of this patient's care.  Abigaelle Verley A Saryiah Bencosme, PharmD Clinical Pharmacist 08/22/2024 7:45 AM

## 2024-08-22 NOTE — ED Provider Notes (Signed)
 [facility] Provider Note    Event Date/Time   First MD Initiated Contact with Patient 08/22/24 0141     (approximate)   History   Shortness of Breath   HPI Mike Decker is a 83 y.o. male with a past medical history that includes coronary artery disease, CHF on torsemide , and atrial fibrillation on Eliquis who presents to the emergency department by EMS for shortness of breath.  He was seen about 24 hours ago for chest pain and was evaluated and discharged.  He said that he was feeling fine and went to bed at about 11:30 PM tonight.  He woke up just prior to arrival to the emergency department feeling very short of breath.  He then started to cough and said he coughed up frank blood, not frothy sputum or pink bubbles.  He said that is never happened before even since he has been on Eliquis.    He denies any recent fever.  He has not had any additional chest pain.  He has had a cough.  He said that his weight has been up about 8 pounds from his baseline and he feels like he is volume overloaded but this has never happened to him before when he is having a CHF exacerbation.  EMS reports that he was satting in the mid 80s and in some respiratory distress when they arrived.     Physical Exam   Triage Vital Signs: ED Triage Vitals  Encounter Vitals Group     BP 08/22/24 0150 (!) 116/44     Girls Systolic BP Percentile --      Girls Diastolic BP Percentile --      Boys Systolic BP Percentile --      Boys Diastolic BP Percentile --      Pulse Rate 08/22/24 0150 65     Resp --      Temp 08/22/24 0150 98.4 F (36.9 C)     Temp Source 08/22/24 0150 Oral     SpO2 08/22/24 0149 94 %     Weight 08/22/24 0150 88.4 kg (194 lb 14.4 oz)     Height 08/22/24 0150 1.676 m (5' 6)     Head Circumference --      Peak Flow --      Pain Score 08/22/24 0150 6     Pain Loc --      Pain Education --      Exclude from Growth Chart --     Most recent vital  signs: Vitals:   08/22/24 0300 08/22/24 0400  BP: (!) 110/41 (!) 117/48  Pulse: (!) 56 (!) 57  Resp: 13 17  Temp:    SpO2: 97% 98%    General: Awake, alert, conversant. CV:  Good peripheral perfusion.  Regular rate and rhythm, normal heart sounds. Resp:  Normal effort. Speaking easily and comfortably, no accessory muscle usage nor intercostal retractions.  Oxygen saturation 90 to 91% at rest.  Some coarse breath sounds with auscultation and taking deep inspirations makes the patient cough but I did not personally observe any hemoptysis. Abd:  No distention.  No tenderness to palpation of the abdomen. Other:  No peripheral edema but the patient said his feet have been swollen recently.   ED Results / Procedures / Treatments   Labs (all labs ordered are listed, but only abnormal results are displayed) Labs Reviewed  COMPREHENSIVE METABOLIC PANEL WITH GFR - Abnormal; Notable for the following components:  Result Value   Glucose, Bld 206 (*)    BUN 70 (*)    Creatinine, Ser 2.88 (*)    Calcium  8.4 (*)    Total Protein 6.4 (*)    GFR, Estimated 21 (*)    All other components within normal limits  CBC WITH DIFFERENTIAL/PLATELET - Abnormal; Notable for the following components:   WBC 11.2 (*)    RBC 3.24 (*)    Hemoglobin 10.3 (*)    HCT 32.3 (*)    Neutro Abs 8.6 (*)    Eosinophils Absolute 0.6 (*)    Abs Immature Granulocytes 0.09 (*)    All other components within normal limits  BRAIN NATRIURETIC PEPTIDE - Abnormal; Notable for the following components:   B Natriuretic Peptide 344.3 (*)    All other components within normal limits  BLOOD GAS, VENOUS - Abnormal; Notable for the following components:   pCO2, Ven 43 (*)    Acid-Base Excess 2.2 (*)    All other components within normal limits  LIPASE, BLOOD - Abnormal; Notable for the following components:   Lipase 56 (*)    All other components within normal limits  TROPONIN I (HIGH SENSITIVITY) - Abnormal; Notable for  the following components:   Troponin I (High Sensitivity) 53 (*)    All other components within normal limits  RESP PANEL BY RT-PCR (RSV, FLU A&B, COVID)  RVPGX2  CULTURE, BLOOD (SINGLE)  LACTIC ACID, PLASMA  PROTIME-INR  TROPONIN I (HIGH SENSITIVITY)     EKG  ED ECG REPORT I, Darleene Dome, the attending physician, personally viewed and interpreted this ECG.  Date: 08/22/2024 EKG Time: 1:44 AM Rate: 67 Rhythm: normal sinus rhythm QRS Axis: normal Intervals: normal ST/T Wave abnormalities: Non-specific ST segment / T-wave changes, but no clear evidence of acute ischemia. Narrative Interpretation: no definitive evidence of acute ischemia; does not meet STEMI criteria.    RADIOLOGY I independently viewed and interpreted the patient's chest x-ray and it is worrisome for some opacities particular in the right.  The radiologist expressed concerned about edema versus consolidation (pneumonia) versus pulmonary hemorrhage.   PROCEDURES:  Critical Care performed: Yes, see critical care procedure note(s)  .1-3 Lead EKG Interpretation  Performed by: Dome Darleene, MD Authorized by: Dome Darleene, MD     Interpretation: normal     ECG rate:  65   ECG rate assessment: normal     Rhythm: sinus rhythm     Ectopy: none     Conduction: normal   .Critical Care  Performed by: Dome Darleene, MD Authorized by: Dome Darleene, MD   Critical care provider statement:    Critical care time (minutes):  30   Critical care time was exclusive of:  Separately billable procedures and treating other patients   Critical care was necessary to treat or prevent imminent or life-threatening deterioration of the following conditions: hemoptysis requiring IV TXA.   Critical care was time spent personally by me on the following activities:  Development of treatment plan with patient or surrogate, evaluation of patient's response to treatment, examination of patient, obtaining history from patient or  surrogate, ordering and performing treatments and interventions, ordering and review of laboratory studies, ordering and review of radiographic studies, pulse oximetry, re-evaluation of patient's condition and review of old charts     IMPRESSION / MDM / ASSESSMENT AND PLAN / ED COURSE  I reviewed the triage vital signs and the nursing notes.  Differential diagnosis includes, but is not limited to, pulmonary hemorrhage, pneumonia, CHF exacerbation.  Patient's presentation is most consistent with acute presentation with potential threat to life or bodily function.  Labs/studies ordered: EKG, chest x-ray, CMP, respiratory viral panel, VBG, blood culture, lactic acid, BNP, high-sensitivity opponent, lipase, pro time-INR, CBC with differential  Interventions/Medications given:  Medications  tranexamic acid  (CYKLOKAPRON ) IVPB 1,000 mg (has no administration in time range)  ondansetron  (ZOFRAN ) injection 4 mg (4 mg Intravenous Given 08/22/24 0405)    (Note:  hospital course my include additional interventions and/or labs/studies not listed above.)   Patient's presentation most worrisome for pulmonary hemorrhage.  He has substantial chronic kidney disease with a baseline creatinine of at least 2.5.  I will send him for a CT chest without contrast as at least an initial study to avoid additional kidney injury but he may require more advanced intervention.  He is currently not coughing up any blood and I put him on 2 L of oxygen for comfort.  He is at high risk for requiring intubation although he is comfortable at this time.  Broad evaluation pending.  The patient is on the cardiac monitor to evaluate for evidence of arrhythmia and/or significant heart rate changes.   Clinical Course as of 08/22/24 0436  Sat Aug 22, 2024  0245 Blood gas, venous(!) VBG is generally reassuring with no hypercapnia and no acidosis [CF]  0331 Lactic Acid, Venous: 1.7 Normal lactic  acid, canceling the repeat [CF]  0331 Troponin I (High Sensitivity)(!): 53 Very slight elevation of troponin over his prior value, unclear if this is representative of increased heart strain or of his chronic kidney disease is not clearing the troponin. [CF]  0331 Lipase(!): 56 Stable lipase [CF]  0332 CBC with Differential(!) Stable CBC [CF]  0332 Comprehensive metabolic panel(!) CMP is essentially normal for him with a creatinine of 2.88, slightly up from baseline but improved from his last visit [CF]  0407 CT Chest Wo Contrast I independently viewed and interpreted the patient's chest CT, and there are extensive opacities that are asymmetric and most notable on the right side.  Radiologist interpreted this as pulmonary hemorrhage versus asymmetric pulmonary edema.  Given the concern for the patient's Eliquis and pulmonary hemorrhage with frank mopped assist, I am consulting with Almarie Nose, PCCM, to discuss if she feels he would be appropriate for the ICU service. [CF]  [id number] I reassessed the patient.  He coughed up a bit more blood but it is a relatively small amount but it is bright red.  He remains comfortable on 2 L of oxygen.  I updated him about the results and the need for admission.  He said he understands.  We also talked about the possibility of needing to reverse his Eliquis, particularly given the acute bleed and the fact that he is in sinus rhythm, and he understands the risks and benefits associated with that decision. [CF]  0434 Consulted with PCCM provider [place].   we discussed the case and she agreed to admit the patient to her service.  We discussed management plans and agreed upon 1 g TXA IV.  Since he is not having large-volume hemoptysis and his hemoglobin is stable thus far, we will hold off on Eliquis reversal, particularly because it has been more than 24 hours since he took his last dose.  She feels he will most likely need an urgent bronchoscopy.  I will make sure the  patient has an n.p.o. order in place.  She will see the patient in the emergency department and admit him to the ICU. [CF]    Clinical Course User Index [CF] Gordan Huxley, MD     FINAL CLINICAL IMPRESSION(S) / ED DIAGNOSES   Final diagnoses:  Hemoptysis  Current use of long term anticoagulation  Chronic kidney disease, unspecified CKD stage     Rx / DC Orders   ED Discharge Orders     None        Note:  This document was prepared using Dragon voice recognition software and may include unintentional dictation errors.   Gordan Huxley, MD 08/22/24 [phone]

## 2024-08-22 NOTE — TOC Initial Note (Signed)
 Transition of Care [facility]) - Initial/Assessment Note    Patient Details  Name: Mike Decker MRN: [id number] Date of Birth: August 04, 1941  Transition of Care [facility]) CM/SW Contact:    [name], LCSW Phone Number: 08/22/2024, 12:46 PM  Clinical Narrative:                 Chart reviewed. The patient was admitted for Hemoptysis I spoke with the patient at bedside today. I introduced myself, my role, and reason for consult. The patient reports that he has been doing well today. Per chart the patient PCP is [facility]. The patient reports that he lives in the home with his wife, Mike Decker. The patient reports that he was able to complete daily living task independently before being admitted to the hospital. The patient reports that his wife drives him to medial appointments and will assist during discharge.   The [facility] that [facility] and copays are affordable. The patient reports that he has had HH almost 6 years ago but does not remember what agency it was with. The patient reports that he has never been admitted into a SNF. The patient reports that he has a walker, cane, and shower chair in the home.   There are no TOC needs at the moment. TOC will follow the patient until discharge.     Barriers to Discharge: Continued Medical Work up   Patient Goals and CMS Choice            Expected Discharge Plan and Services                                              Prior Living Arrangements/Services   Lives with:: Spouse Patient language and need for interpreter reviewed:: Yes        Need for Family Participation in Patient Care: Yes (Comment)     Criminal Activity/Legal Involvement Pertinent to Current Situation/Hospitalization: No - Comment as needed  Activities of Daily Living   ADL Screening (condition at time of admission) Independently performs ADLs?: Yes (appropriate for developmental age) Is the patient deaf or have  difficulty hearing?: Yes Does the patient have difficulty seeing, even when wearing glasses/contacts?: No Does the patient have difficulty concentrating, remembering, or making decisions?: No  Permission Sought/Granted                  Emotional Assessment Appearance:: Appears stated age Attitude/Demeanor/Rapport: Gracious, Engaged Affect (typically observed): Calm, Pleasant Orientation: : Oriented to Place, Oriented to Self, Oriented to  Time, Oriented to Situation Alcohol / Substance Use: Not Applicable Psych Involvement: No (comment)  Admission diagnosis:  Current use of long term anticoagulation [Z79.01] Hemoptysis [R04.2] Chronic kidney disease, unspecified CKD stage [N18.9] Patient Active Problem List   Diagnosis Date Noted   Syncope 07/04/2024   Dyslipidemia 07/02/2024   Type 2 diabetes mellitus without complication, with long-term current use of insulin  (HCC) 07/02/2024   Chronic GERD without esophagitis 07/02/2024   Chronic asthma, mild intermittent, uncomplicated 06/14/2024   Acute respiratory failure with hypoxia (HCC) 06/02/2024   Angina at rest [facility]) 04/23/2024   Atrial fibrillation, chronic (HCC) 10/30/2023   Obesity (BMI 30-39.9) 04/24/2023   Moderate aortic stenosis 04/24/2023   Paroxysmal atrial fibrillation with RVR (HCC) 04/24/2023   Bronchiectasis (HCC) 04/24/2023   Hemoptysis 04/24/2023   Acute kidney injury  superimposed on chronic kidney disease (HCC) 04/18/2023   Leukocytosis 04/18/2023   Dysphagia 01/31/2023   Vitamin B12 deficiency 12/26/2022   Type II diabetes mellitus with renal manifestations (HCC) 08/15/2022   Depression 08/15/2022   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   Urinary tract infection without hematuria 11/01/2020   Dysuria 11/01/2020   Hyposmolality and/or hyponatremia 09/14/2020   Cough 09/13/2020   Bronchitis 07/15/2020   Palpitations 07/15/2020   Aortic atherosclerosis (HCC) 06/21/2020   (HFpEF) heart  failure with preserved ejection fraction (HCC) 06/21/2020   Otalgia of both ears 05/27/2020   Lobar pneumonia (HCC)    Type 2 diabetes mellitus with stage 4 chronic kidney disease, without long-term current use of insulin  (HCC) 12/14/2019   Elevated troponin    Acidosis 10/30/2019   Benign 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 10/30/2019   Hyperkalemia 10/30/2019   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/30/2019   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   Encounter for fitting and adjustment of hearing aid 06/16/2019   Health maintenance alteration 06/16/2019   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/16/2019   DM type 2 with diabetic peripheral neuropathy (HCC) 01/25/2019   CKD (chronic kidney disease), stage IV (HCC) 01/21/2019   Gout 01/21/2019   MR (mitral regurgitation) 01/21/2019   Chronic obstructive pulmonary disease (HCC) 01/08/2019   OSA (obstructive sleep apnea) 01/08/2019   Acute on chronic diastolic CHF (congestive heart failure) (HCC) 12/30/2018   Polyp of sigmoid colon    Benign neoplasm of ascending colon    Hemorrhoids without complication    Diverticulosis of large intestine without diverticulitis    Reflux esophagitis    Lymphangiectasia    Arteriovenous malformation of duodenum    Symptomatic anemia    Orthopnea    Iron  deficiency anemia due to chronic blood loss    Unstable angina (HCC)    Anemia of chronic renal failure, stage 4 (severe) (HCC)    Nonspecific chest pain 11/14/2018   Sciatica 11/12/2018   CAD (coronary artery disease) 02/27/2018   Sacroiliac joint pain 10/08/2017   Pain in limb 09/24/2017   Neuropathy 07/24/2017   NSTEMI (non-ST elevated myocardial infarction) (HCC) 05/06/2017   PAD (peripheral artery disease) (HCC) 05/01/2017   History of stroke 01/02/2017   Barrett's esophagus 10/02/2016   Carotid stenosis 10/02/2016   Essential hypertension 10/02/2016   Hyperlipidemia 10/02/2016   Diabetes (HCC) 10/02/2016    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) 07/24/2016   Uncontrolled type 2 diabetes mellitus with hyperglycemia, with long-term current use of insulin  (HCC) 04/10/2016   TIA (transient ischemic attack) 10/08/2015   PCP:  Liana Fish, NP Pharmacy:   [facility] [address], [place] - [address] [place] [address] Phone: [phone] Fax: [phone]  [facility] - [place], [place] - [address] [place] [address] Phone: [phone] Fax: [phone]     Social Drivers of Health (SDOH) Social History: SDOH Screenings   Food Insecurity: No Food Insecurity (08/22/2024)  Housing: Low Risk  (08/22/2024)  Transportation Needs: No Transportation Needs (08/22/2024)  Utilities: Not At Risk (08/22/2024)  Alcohol Screen: Low Risk  (09/04/2018)  Depression (PHQ2-9): Low Risk  (06/17/2024)  Financial Resource Strain: Low Risk  (06/21/2023)   Received from [facility]  Physical Activity: Insufficiently Active (09/15/2021)  Social Connections: Moderately Isolated (08/22/2024)  Stress: No Stress Concern Present (09/15/2021)  Tobacco Use: Medium Risk (08/21/2024)   SDOH Interventions:  Readmission Risk Interventions    08/22/2024   12:45 PM 04/27/2024    2:52 PM 10/30/2023   11:28 AM  Readmission Risk Prevention Plan  Transportation Screening Complete Complete Complete  PCP or Specialist Appt within 5-7 Days   Complete  Home Care Screening   Complete  Medication Review (RN CM)   Complete  Medication Review [employer]) Complete Complete   PCP or Specialist appointment within 3-5 days of discharge Complete Complete   HRI or Home Care Consult  Complete   SW Recovery Care/Counseling Consult Complete Complete   Palliative Care Screening Not Applicable Not Applicable   Skilled Nursing Facility Not Applicable Not Applicable

## 2024-08-22 NOTE — ED Triage Notes (Signed)
 Pt arrived via [facility] ([facility]) from home c/o SOB, especially with exertion. Pt has consistent cough and hemoptysis. Pt has chest pain only with cough.  Pt on Eliquis.

## 2024-08-22 NOTE — ED Notes (Signed)
 Report called to Dustin in the ICU; pt room changed to room 3.

## 2024-08-22 NOTE — Care Management Obs Status (Signed)
 MEDICARE OBSERVATION STATUS NOTIFICATION   Patient Details  Name: Mike Decker MRN: [id number] Date of Birth: 02-Sep-1941   Medicare Observation Status Notification Given:  Yes    [name], LCSW 08/22/2024, 12:57 PM

## 2024-08-22 NOTE — Plan of Care (Signed)
  Problem: Education: Goal: Knowledge of General Education information will improve Description: Including pain rating scale, medication(s)/side effects and non-pharmacologic comfort measures Outcome: Progressing   Problem: Clinical Measurements: Goal: Will remain free from infection Outcome: Progressing Goal: Diagnostic test results will improve Outcome: Progressing Goal: Respiratory complications will improve Outcome: Progressing Goal: Cardiovascular complication will be avoided Outcome: Progressing   Problem: Nutrition: Goal: Adequate nutrition will be maintained Outcome: Progressing   Problem: Elimination: Goal: Will not experience complications related to urinary retention Outcome: Progressing   Problem: Pain Managment: Goal: General experience of comfort will improve and/or be controlled Outcome: Progressing

## 2024-08-22 NOTE — H&P (Addendum)
 NAME:  Mike Decker, MRN:  [id number], DOB:  [birth year], LOS: 0 ADMISSION DATE:  08/22/2024, CONSULTATION DATE: 08/22/2024 REFERRING MD: Gordan Huxley, CHIEF COMPLAINT: Hemoptysis  HPI  83 y.o male with significant PMH of Barrett's esophagus, Esophageal stricture s/p dilation 2024, CAD s/p PCI w/DES RCA, prox LCx, HFpEF, Atrial Fibrillation on Eliquis, CKD stage IV, T2DM, carotid stenosis, COPD, OSA on CPAP and prostate cancer who presented to the ED with chief complaints of shortness of breath and hemoptysis.  On review of chart, patient was seen in the ED yesterday 9/12 for ongoing central chest pain with radiation to the left shoulder.  Workup was unrevealing, he was therefore treated with nitroglycerin , GI cocktail and discharged home with instructions to return to the ED with worsening symptoms.  Patient presented to the ED with shortness of breath that woke him up around 11:30 PM.  He then developed productive cough which was initially pink then he later noticed that he was frank blood.  Patient was concerned given his recent weight gain of about 8 pounds that he was in heart failure he therefore called EMS.   ED Course: Initial vital signs showed HR of 65 beats/minute, BP 116/44 mm Hg, the RR 13 breaths/minute, and the oxygen saturation 94% on RA and a temperature of 98.74F (36.9C). Pertinent Labs/Diagnostics Findings: Glucose: 206 BUN/Cr.:  70/2.88 WBC: 11.2 K/L Hgb/Hct: 10.3/32.3 COVID PCR: Negative,  troponin: 53 BNP: 344.3 CTA Chest>findings concerning for asymmetric pulmonary hemorrhage or developing mnemonic consolidation.  Patient received 1 g TXA IV and admitted to [facility] service  Past Medical History  Barrett's esophagus, Esophageal stricture s/p dilation 2024, CAD s/p PCI w/DES RCA, prox LCx, HFpEF, Atrial Fibrillation on Eliquis, CKD stage IV, T2DM, carotid stenosis, COPD, OSA on CPAP and prostate cancer  Significant Hospital Events   9/13: Admitted with acute  hypoxic respiratory failure ISO suspected pulmonary hemorrhage versus developing pneumonia  Consults:  Cardiology Vascular  Procedures:  None  Interim History / Subjective:      Micro Data:  9/13: SARS-CoV-2 PCR> negative 9/13: Influenza PCR> negative 9/13: Blood culture x2> 9/13: Respiratory viral panel > 9/13: MRSA PCR>>  9/13: Strep pneumo urinary antigen> 9/13: Legionella urinary antigen>  Antimicrobials:  None  OBJECTIVE  Blood pressure (!) 117/48, pulse (!) 57, temperature 98.4 F (36.9 C), temperature source Oral, resp. rate 17, height 5' 6 (1.676 m), weight 88.4 kg, SpO2 98%.       No intake or output data in the 24 hours ending 08/22/24 0436 Filed Weights   08/22/24 0150  Weight: 88.4 kg   Physical Examination  GEN: Critically ill patient, WDWN in NAD HEENT: [place]/AT. PERRL, sclerae anicteric. HEART: Irregular rhythm, normal rate, S1, S2, no M/R/G,  LUNGS: CTAB, mild wheezes, no increased WOB,  EXTREMITIES: No Edema, cap refill  NEURO: No gross focal deficits. PSYCH:  Mood and Affect: Mood normal.  ABDOMINAL: Soft: BS x 4, NTND SKIN: Intact, warm, no rashes lesion, or ulcer  Labs/imaging that I havepersonally reviewed  (right click and Reselect all SmartList Selections daily)    CT Chest Wo Contrast Result Date: 08/22/2024 EXAM: CT CHEST WITHOUT CONTRAST 08/22/2024 03:26:47 AM TECHNIQUE: CT of the chest was performed without the administration of intravenous contrast. Multiplanar reformatted images are provided for review. Automated exposure control, iterative reconstruction, and/or weight based adjustment of the mA/kV was utilized to reduce the radiation dose to as low as reasonably achievable. COMPARISON: None available. CLINICAL HISTORY: Respiratory illness, nondiagnostic xray; suspect pulmonary  hemorrhage (hemoptysis on Eliquis) but patient has severe CKD. Pt arrived via [facility] ([facility]) from home c/o SOB, especially with exertion. Pt has  consistent cough and hemoptysis. Pt has chest pain only with cough. Pt on Eliquis. FINDINGS: MEDIASTINUM: Extensive calcification of the aortic valve leaflets. Extensive multivessel coronary artery calcification. Cardiac size within normal limits. Hypoattenuation of the cardiac blood pool in keeping with at least mild anemia. No pericardial effusion. Enlarged moderate atherosclerotic calcification within the thoracic aorta. No aortic aneurysm. LYMPH NODES: No mediastinal, hilar or axillary lymphadenopathy. LUNGS AND PLEURA: Underlying subpleural pulmonary fibrotic change is again identified. There is developing smooth interlobular septal thickening best appreciated at the lung bases in keeping with trace interstitial pulmonary edema. There are development of asymmetric ground glass pulmonary infiltrates throughout the right lung which may reflect asymmetric pulmonary hemorrhage in this patient with clinical hemoptysis or developing pneumonic consolidation. Trace bilateral pleural effusions are present. No pneumothorax. No central obstructing lesion. Thickening of the peribronchovascular interstitium asymmetrically involving the right lung which is in keeping with probable airway inflammation. SOFT TISSUES/BONES: No acute abnormality of the bones or soft tissues. UPPER ABDOMEN: A small hiatal hernia. IMPRESSION: 1. Asymmetric ground glass pulmonary infiltrates throughout the right lung, possibly representing asymmetric pulmonary hemorrhage or developing pneumonic consolidation. 2. Trace interstitial pulmonary edema and trace bilateral pleural effusions. 3. Underlying subpleural pulmonary fibrotic change. 4. Extensive calcification of the aortic valve leaflets and multivessel coronary arteries. Electronically signed by: Dorethia Molt MD 08/22/2024 03:53 AM EDT RP Workstation: [id number]   DG Chest Port 1 View Result Date: 08/22/2024 EXAM: 1 VIEW XRAY OF THE CHEST 08/22/2024 02:03:05 AM COMPARISON: 08/21/2024  CLINICAL HISTORY: Questionable sepsis - evaluate for abnormality. c/o SOB, especially with exertion. Pt has consistent cough and hemoptysis. Pt has chest pain only with cough. Pt on Eliquis. FINDINGS: LUNGS AND PLEURA: Increased right lung airspace opacities superimposed on chronic interstitial coarsening. This can be seen with developing pneumonic consolidation or asymmetric pulmonary hemorrhage. No pleural effusion. No pneumothorax. HEART AND MEDIASTINUM: No acute abnormality of the cardiac and mediastinal silhouettes. BONES AND SOFT TISSUES: No acute osseous abnormality. IMPRESSION: 1. Increased right lung airspace opacities superimposed on chronic interstitial coarsening, possibly representing developing pneumonic consolidation or asymmetric pulmonary hemorrhage. Electronically signed by: Dorethia Molt MD 08/22/2024 02:09 AM EDT RP Workstation: [id number]   DG Chest 1 View Result Date: 08/21/2024 EXAM: 1 VIEW XRAY OF THE CHEST 08/21/2024 05:06:04 AM COMPARISON: 07/07/2024 CLINICAL HISTORY: Complaints of CP that started last night. Per EMS, pt has taken his supplied nitroglycerin  and was provided nitroglycerin  by EMS with a total of 6 nitros PTA. Rates the pain 8/10. FINDINGS: LUNGS AND PLEURA: Persistent chronic interstitial coarsening of right lung. No focal pulmonary opacity. No pulmonary edema. No pleural effusion. No pneumothorax. HEART AND MEDIASTINUM: No acute abnormality of the cardiac and mediastinal silhouettes. BONES AND SOFT TISSUES: No acute osseous abnormality. IMPRESSION: 1. No acute process. 2. Persistent chronic interstitial coarsening of the right lung. Electronically signed by: Waddell Calk MD 08/21/2024 06:18 AM EDT RP Workstation: [id number]    Labs   CBC: Recent Labs  Lab 08/21/24 0448 08/22/24 0156  WBC 10.4 11.2*  NEUTROABS 7.7 8.6*  HGB 10.7* 10.3*  HCT 32.1* 32.3*  MCV 98.5 99.7  PLT 236 243   Basic Metabolic Panel: Recent Labs  Lab 08/21/24 0448 08/22/24 0156  NA  139 138  K 3.5 4.2  CL 103 103  CO2 24 22  GLUCOSE 211* 206*  BUN 71*  70*  CREATININE 2.95* 2.88*  CALCIUM  8.4* 8.4*   GFR: Estimated Creatinine Clearance: 19.9 mL/min (A) (by C-G formula based on SCr of 2.88 mg/dL (H)). Recent Labs  Lab 08/21/24 0448 08/22/24 0156  WBC 10.4 11.2*  LATICACIDVEN  --  1.7   Liver Function Tests: Recent Labs  Lab 08/21/24 0448 08/22/24 0156  AST 18 20  ALT 13 14  ALKPHOS 50 48  BILITOT 0.8 0.9  PROT 6.4* 6.4*  ALBUMIN  3.6 3.6   Recent Labs  Lab 08/21/24 0448 08/22/24 0156  LIPASE 55* 56*   No results for input(s): AMMONIA in the last 168 hours.  ABG    Component Value Date/Time   PHART 7.34 (L) 05/07/2017 0510   PCO2ART 26 (L) 05/07/2017 0510   PO2ART 60 (L) 05/07/2017 0510   HCO3 27.3 08/22/2024 0157   ACIDBASEDEF 10.1 (H) 05/07/2017 0510   O2SAT 41.5 08/22/2024 0157   Coagulation Profile: Recent Labs  Lab 08/22/24 0156  INR 1.0   Cardiac Enzymes: No results for input(s): CKTOTAL, CKMB, CKMBINDEX, TROPONINI in the last 168 hours.  HbA1C: Hemoglobin A1C  Date/Time Value Ref Range Status  01/22/2024 12:00 AM 7.6  Final  07/18/2021 12:00 AM 8.3  Final   Hgb A1c MFr Bld  Date/Time Value Ref Range Status  10/31/2023 04:40 AM 7.1 (H) 4.8 - 5.6 % Final    Comment:    (NOTE) Pre diabetes:          5.7%-6.4%  Diabetes:              >6.4%  Glycemic control for   <7.0% adults with diabetes   04/19/2023 01:34 AM 6.2 (H) 4.8 - 5.6 % Final    Comment:    (NOTE) Pre diabetes:          5.7%-6.4%  Diabetes:              >6.4%  Glycemic control for   <7.0% adults with diabetes    CBG: No results for input(s): GLUCAP in the last 168 hours.  Review of Systems:   Review of Systems  Constitutional:  Negative for chills, diaphoresis, fever, malaise/fatigue and weight loss.  HENT: Negative.    Eyes: Negative.   Respiratory:  Positive for cough, hemoptysis, sputum production, shortness of breath and  wheezing.   Cardiovascular:  Positive for chest pain and leg swelling.  Gastrointestinal: Negative.   Genitourinary:  Positive for frequency.  Musculoskeletal: Negative.   Skin: Negative.   Neurological: Negative.   Endo/Heme/Allergies: Negative.   Psychiatric/Behavioral: Negative.     Past Medical History  He,  has a past medical history of Anemia, Arthritis, Atrioventricular canal (AVC), Barrett esophagus, Bronchiolitis, Cancer (HCC) (2002), Chronic diastolic CHF (congestive heart failure) (HCC), Colon polyp, Diabetes mellitus without complication (HCC), Diverticulosis, Gout, Heart disease, Hemangioma, Hyperlipidemia, Hypertension, Myocardial infarct (HCC), Ocular hypertension, Peripheral vascular disease (HCC), Skin cancer, 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HCC), Sleep apnea, and Vitreoretinal degeneration.   Surgical History    Past Surgical History:  Procedure Laterality Date   CATARACT EXTRACTION  2011, 2012   COLONOSCOPY N/A 12/14/2018   Procedure: COLONOSCOPY;  Surgeon: Janalyn Keene NOVAK, MD;  Location: [facility] ENDOSCOPY;  Service: Endoscopy;  Laterality: N/A;   CORONARY ANGIOPLASTY WITH STENT PLACEMENT  12/10/2010   CORONARY STENT INTERVENTION N/A 12/29/2019   Procedure: CORONARY STENT INTERVENTION;  Surgeon: Mady Bruckner, MD;  Location: [facility] INVASIVE CV LAB;  Service: Cardiovascular;  Laterality: N/A;   ESOPHAGOGASTRODUODENOSCOPY N/A 12/14/2018   Procedure: ESOPHAGOGASTRODUODENOSCOPY (EGD);  Surgeon: Janalyn Keene NOVAK, MD;  Location: [facility] ENDOSCOPY;  Service: Endoscopy;  Laterality: N/A;   ESOPHAGOGASTRODUODENOSCOPY N/A 07/09/2024   Procedure: EGD (ESOPHAGOGASTRODUODENOSCOPY);  Surgeon: Onita Elspeth Sharper, DO;  Location: [facility] ENDOSCOPY;  Service: Gastroenterology;  Laterality: N/A;  DM   ESOPHAGOGASTRODUODENOSCOPY (EGD) WITH PROPOFOL  N/A 06/01/2016   Procedure: ESOPHAGOGASTRODUODENOSCOPY (EGD) WITH PROPOFOL ;  Surgeon: Lamar ONEIDA Holmes, MD;  Location: [facility] ENDOSCOPY;  Service: Endoscopy;   Laterality: N/A;   ESOPHAGOGASTRODUODENOSCOPY (EGD) WITH PROPOFOL  N/A 01/31/2023   Procedure: ESOPHAGOGASTRODUODENOSCOPY (EGD) WITH PROPOFOL ;  Surgeon: Jinny Carmine, MD;  Location: [facility] ENDOSCOPY;  Service: Endoscopy;  Laterality: N/A;   FEMORAL ARTERY STENT Right    HERNIA REPAIR     x2   INTRAOCULAR LENS INSERTION     LEFT HEART CATH AND CORONARY ANGIOGRAPHY N/A 05/09/2017   Procedure: Left Heart Cath and Coronary Angiography;  Surgeon: Florencio Cara BIRCH, MD;  Location: [facility] INVASIVE CV LAB;  Service: Cardiovascular;  Laterality: N/A;   LEFT HEART CATH AND CORONARY ANGIOGRAPHY N/A 12/08/2018   Procedure: LEFT HEART CATH AND CORONARY ANGIOGRAPHY and possible PCI and stent;  Surgeon: Florencio Cara BIRCH, MD;  Location: [facility] INVASIVE CV LAB;  Service: Cardiovascular;  Laterality: N/A;   LEFT HEART CATH AND CORONARY ANGIOGRAPHY N/A 12/29/2019   Procedure: LEFT HEART CATH AND CORONARY ANGIOGRAPHY;  Surgeon: Mady Bruckner, MD;  Location: [facility] INVASIVE CV LAB;  Service: Cardiovascular;  Laterality: N/A;   LEFT HEART CATH AND CORONARY ANGIOGRAPHY Left 12/13/2022   Procedure: LEFT HEART CATH AND CORONARY ANGIOGRAPHY;  Surgeon: Florencio Cara BIRCH, MD;  Location: [facility] INVASIVE CV LAB;  Service: Cardiovascular;  Laterality: Left;   NOSE SURGERY     submucous resection   PROSTATE SURGERY  12/10/2000   ROTATOR CUFF REPAIR Right     Social History   reports that he quit smoking about 47 years ago. His smoking use included cigarettes. He started smoking about 67 years ago. He has never used smokeless tobacco. He reports that he does not drink alcohol and does not use drugs.   Family History   His family history includes Arthritis in his mother; Stroke in his maternal grandfather. There is no history of Breast cancer.   Allergies Allergies  Allergen Reactions   Gabapentin      Other reaction(s): Other (See Comments) Tremors   Peanut-Containing Drug Products Anaphylaxis   Penicillins Hives and Rash     Per notes - patient tolerated cefdinir in 2022    Bee Venom Swelling   Influenza Vaccines Hives   Inh [Isoniazid ] Hives   Kenalog [Triamcinolone Acetonide] Hives   Levaquin  [Levofloxacin ] Other (See Comments)    Tendon, ligament pain.    Lisinopril      Other reaction(s): Hyperkalemia   Nalfon [Fenoprofen Calcium ] Hives   Naproxen    Peanut Oil    Nsaids Rash    Nalfon 600 Nalfon 600   Home Medications  Prior to Admission medications   Medication Sig Start Date End Date Taking? Authorizing Provider  acetaminophen  (TYLENOL ) 500 MG tablet Take 1,000 mg by mouth every 8 (eight) hours as needed for mild pain.    [provider]  apixaban (ELIQUIS) 2.5 MG TABS tablet Take 2.5 mg by mouth. 07/27/24   [provider]  atorvastatin  (LIPITOR ) 80 MG tablet Take 1 tablet (80 mg total) by mouth daily. 04/25/23   Laurita Pillion, MD  azelastine  (ASTELIN ) 0.1 % nasal spray USE 1 TO 2 SPRAYS IN EACH NOSTRIL TWICE A DAY 06/01/20   [provider]  calcitRIOL  (ROCALTROL ) 0.25 MCG capsule Take 0.25 mcg by mouth daily.    [provider]  citalopram  (CELEXA ) 10 MG tablet Take 1 tablet (10 mg total) by mouth daily. 03/12/22   Britta King, MD  clopidogrel  (PLAVIX ) 75 MG tablet TAKE 1 TABLET BY MOUTH EVERY DAY Patient not taking: Reported on 07/30/2024 07/02/22   Britta King, MD  colchicine  0.6 MG tablet Take 1 tablet (0.6 mg total) by mouth 2 (two) times daily as needed. Patient not taking: Reported on 07/30/2024 07/05/24   Amin, Sumayya, MD  [employer] VITAMIN B12 1000 MCG tablet TAKE 1 TABLET BY MOUTH EVERY DAY 11/21/21   Babara Call, MD  diltiazem  (CARDIZEM ) 30 MG tablet Take 1 tablet (30 mg total) by mouth 2 (two) times daily. Do not take this medication if SBP less than 110 and/or HR less than 60 07/05/24 08/04/24  Caleen Qualia, MD  docusate sodium  (COLACE) 100 MG capsule Take 100 mg by mouth 2 (two) times daily. Patient taking differently: Take 200 mg by mouth daily.     [provider]  EPINEPHrine  (EPI-PEN) 0.3 mg/0.3 mL SOAJ injection Inject into the muscle as needed (anaphylaxis).    [provider]  ergocalciferol  (VITAMIN D2) 1.25 MG (50000 UT) capsule Take 50,000 Units by mouth every 30 (thirty) days.    [provider]  feeding supplement (ENSURE PLUS HIGH PROTEIN) LIQD Take 237 mLs by mouth 3 (three) times daily between meals. 07/05/24   Caleen Qualia, MD  ferrous sulfate  325 (65 FE) MG EC tablet TAKE 1 TABLET BY MOUTH EVERY DAY 02/06/21   Babara Call, MD  fluticasone  (FLONASE ) 50 MCG/ACT nasal spray USE 1 SPRAY IN EACH NOSTRIL EVERY DAY 09/12/20   Britta King, MD  hydrALAZINE  (APRESOLINE ) 50 MG tablet Take 25 mg by mouth 2 (two) times daily. 10/14/23   [provider]  insulin  aspart (NOVOLOG ) 100 UNIT/ML FlexPen Inject 10-30 Units into the skin 3 (three) times daily with meals. Take 10 units before breakfast, 30 units before lunch and 30 units before supper. 04/25/20   [provider]  insulin  glargine (LANTUS ) 100 UNIT/ML injection Inject 0.35 mLs (35 Units total) into the skin at bedtime. This is a decrease from your previous 45 units nightly. Patient taking differently: Inject 60 Units into the skin at bedtime. 12/30/19   Awanda City, MD  isosorbide  mononitrate (IMDUR ) 120 MG 24 hr tablet Take 1 tablet (120 mg total) by mouth 2 (two) times daily. 11/03/23   Alexander, Natalie, DO  JARDIANCE 10 MG TABS tablet Take 10 mg by mouth daily. 02/19/22   [provider]  lidocaine  (XYLOCAINE ) 2 % solution Use as directed 15 mLs in the mouth or throat every 6 (six) hours as needed (throat/swallowing pain). 07/05/24   Caleen Qualia, MD  loratadine  (CLARITIN ) 10 MG tablet Take 10 mg by mouth daily.    [provider]  magnesium  oxide (MAG-OX) 400 (240 Mg) MG tablet Take 1 tablet by mouth daily. 04/25/22   [provider]  methocarbamol  (ROBAXIN ) 500 MG tablet Take 500 mg by mouth 2 (two) times daily.  11/08/18   [provider]  metolazone  (ZAROXOLYN ) 2.5 MG tablet Take 2.5 mg by mouth once a week. 06/04/24 06/04/25  [provider]  metoprolol  succinate (TOPROL -XL) 100 MG 24 hr tablet Take 1 tablet (100 mg total) by mouth daily. Take with or immediately following a meal. 04/28/24 07/30/24  Trudy Anthony HERO, MD  mometasone  (ASMANEX ) 220 MCG/ACT inhaler Inhale  2 puffs into the lungs daily. 05/30/22   [provider]  montelukast  (SINGULAIR ) 10 MG tablet TAKE 1 TABLET AT BEDTIME 06/26/24   McDonough, Lauren K, PA-C  nitroGLYCERIN  (NITROSTAT ) 0.4 MG SL tablet Place 0.4 mg under the tongue every 5 (five) minutes x 3 doses as needed for chest pain.    [provider]  pantoprazole  (PROTONIX ) 40 MG tablet Take 1 tablet (40 mg total) by mouth 2 (two) times daily before a meal. 12/17/19   Amin, Ankit C, MD  ranolazine  (RANEXA ) 500 MG 12 hr tablet Take 1 tablet by mouth 2 (two) times daily.    [provider]  Tiotropium Bromide-Olodaterol (STIOLTO RESPIMAT) 2.5-2.5 MCG/ACT AERS Inhale 1 each into the lungs daily.    [provider]  torsemide  (DEMADEX ) 20 MG tablet Take 40 mg by mouth 2 (two) times daily.    [provider]  Scheduled Meds:  Chlorhexidine  Gluconate Cloth  6 each Topical Daily   ipratropium-albuterol   3 mL Nebulization Q6H   pantoprazole  (PROTONIX ) IV  40 mg Intravenous Q12H   Continuous Infusions: PRN Meds:.docusate sodium , ipratropium-albuterol , polyethylene glycol  Active Hospital Problem list   See systems below  Assessment & Plan:  #Acute Hypoxic Respiratory Failure with hemoptysis iso: #Pulmonary Hemorrhage #Pneumonia #Pulmonary Edema  PMHx: COPDOSA on CPAP -Supplemental O2 as needed to maintain O2 saturations 88 to 92% -Follow cultures, respiratory panel, strep pneumo and Legionella antigen -check vasculitis panel -monitor cbc, fever curve -Ceftriaxone  & Azithromycin  -s/p IV TXA -PRN & scheduled  Bronchodilators -Hold Anticoagulation -Keep Npo -Bronchoscopy to obtain BAL and localize source of hemorrhage if indicated  #Acute on Chronic HFpEF #Atrial Fibrillation~rate controlled Hx:CAD s/p PCI w/DES RCA, prox LcxHTN -Euvolemic on exam. BNP on admission 344.3 -Chest XR (9/13): No acute chest findings , CT chest (9/13) trace pulmonary edema and trace bilateral pleural effusions -ECHO (04/24/24): LVEF 60-65% -Home GDMT of: Imdur , diltiazem , hydralazine , Jardiance, metolazone , metoprolol , jardiance, torsemide  PRN -Strict I/Os -Obtain 2Decho -Hold Eliquis and Plavix   -Cardiology consult  #CKD stage IV Creatinine at baseline -F/u BMP+mag -promote renal perfusion -Avoid nephrotoxic agents  -Replete/correct lytes -Follows with nephrology  #Carotid Stenosis -Follows with vascular plan for right carotid endarterectomy 9/15 -Consult vascular as appropriate  #Type II Diabetes Mellitus  -Check A1c -CBG's q4hrs  -SSI -Hold basal while NPO -Target CBG readings 140 to 180 -Follow hypo/hyperglycemic protocol   Best practice:  Diet:  NPO Pain/Anxiety/Delirium protocol (if indicated): No VAP protocol (if indicated): Not indicated DVT prophylaxis: Contraindicated GI prophylaxis: PPI Glucose control:  SSI Yes Central venous access:  N/A Arterial line:  N/A Foley:  N/A Mobility:  bed rest  PT consulted: N/A Last date of multidisciplinary goals of care discussion []  Code Status:  full code Disposition: ICU   = Goals of Care = Code Status Order:FULL  Primary Emergency ContactJameer, Storie, Home Phone: [phone]   Critical care time: 45 minutes        Almarie Nose DNP, CCRN, FNP-C, AGACNP-BC Acute Care & Family Nurse Practitioner [facility] PCCM on call pager [phone]

## 2024-08-22 NOTE — Progress Notes (Signed)
 1135- Patient complaining of chest pain 6/10. Lonell Moose NP notified and EKG and trop obtained. Patient fell asleep. 1510- Patient complaining of 6/10 chest pain w/ radiation to L arm, SOB and increased O2 requirements. Morphine  given per order without relief. Lonell Moose NP notified and Oxy given per order. Trop and chest xray obtained. Chest/arm pain relieved. Patient continues to have some SOB at rest.

## 2024-08-22 NOTE — ED Notes (Signed)
 Called respiratory to inform that VBG was set to the lab

## 2024-08-22 NOTE — Care Management Important Message (Signed)
 Important Message  Patient Details  Name: Mike Decker MRN: [id number] Date of Birth: 11/18/1941   Important Message Given:        [name], LCSW 08/22/2024, 12:56 PM

## 2024-08-23 ENCOUNTER — Inpatient Hospital Stay

## 2024-08-23 ENCOUNTER — Inpatient Hospital Stay
Admit: 2024-08-23 | Discharge: 2024-08-23 | Disposition: A | Discharge status: Home / Self Care | Attending: Critical Care Medicine | Admitting: Critical Care Medicine

## 2024-08-23 LAB — BLOOD GAS, ARTERIAL
Acid-base deficit: 8.2 mmol/L — ABNORMAL HIGH (ref 0.0–2.0)
Acid-base deficit: 9.3 mmol/L — ABNORMAL HIGH (ref 0.0–2.0)
Acid-base deficit: 7.1 mmol/L — ABNORMAL HIGH (ref 0.0–2.0)
Acid-base deficit: 11 mmol/L — ABNORMAL HIGH (ref 0.0–2.0)
Bicarbonate: 22.6 mmol/L (ref 20.0–28.0)
Bicarbonate: 21.4 mmol/L (ref 20.0–28.0)
Bicarbonate: 21.9 mmol/L (ref 20.0–28.0)
Bicarbonate: 18.4 mmol/L — ABNORMAL LOW (ref 20.0–28.0)
FIO2: 100 %
FIO2: 100 %
FIO2: 100 %
FIO2: 100 %
MECHVT: 500 mL
MECHVT: 500 mL
MECHVT: 550 mL
MECHVT: 500 mL
Mechanical Rate: 16
Mechanical Rate: 20
Mechanical Rate: 24
Mechanical Rate: 24
O2 Saturation: 85.3 %
O2 Saturation: 94.9 %
O2 Saturation: 98.1 %
O2 Saturation: 91.7 %
PEEP: 5 cmH2O
PEEP: 10 cmH2O
PEEP: 10 cmH2O
PEEP: 10 cmH2O
Patient temperature: 37
Patient temperature: 37
Patient temperature: 37
Patient temperature: 37
RATE: 20 {breaths}/min
RATE: 24 {breaths}/min
RATE: 24 {breaths}/min
pCO2 arterial: 71 mmHg (ref 32–48)
pCO2 arterial: 69 mmHg (ref 32–48)
pCO2 arterial: 60 mmHg — ABNORMAL HIGH (ref 32–48)
pCO2 arterial: 58 mmHg — ABNORMAL HIGH (ref 32–48)
pH, Arterial: 7.11 — CL (ref 7.35–7.45)
pH, Arterial: 7.1 — CL (ref 7.35–7.45)
pH, Arterial: 7.17 — CL (ref 7.35–7.45)
pH, Arterial: 7.11 — CL (ref 7.35–7.45)
pO2, Arterial: 59 mmHg — ABNORMAL LOW (ref 83–108)
pO2, Arterial: 81 mmHg — ABNORMAL LOW (ref 83–108)
pO2, Arterial: 102 mmHg (ref 83–108)
pO2, Arterial: 64 mmHg — ABNORMAL LOW (ref 83–108)

## 2024-08-23 LAB — CBC WITH DIFFERENTIAL/PLATELET
Abs Immature Granulocytes: 0.37 K/uL — ABNORMAL HIGH (ref 0.00–0.07)
Basophils Absolute: 0.2 K/uL — ABNORMAL HIGH (ref 0.0–0.1)
Basophils Relative: 1 %
Eosinophils Absolute: 0.4 K/uL (ref 0.0–0.5)
Eosinophils Relative: 2 %
HCT: 33.2 % — ABNORMAL LOW (ref 39.0–52.0)
Hemoglobin: 10.3 g/dL — ABNORMAL LOW (ref 13.0–17.0)
Immature Granulocytes: 2 %
Lymphocytes Relative: 10 %
Lymphs Abs: 1.9 K/uL (ref 0.7–4.0)
MCH: 32.1 pg (ref 26.0–34.0)
MCHC: 31 g/dL (ref 30.0–36.0)
MCV: 103.4 fL — ABNORMAL HIGH (ref 80.0–100.0)
Monocytes Absolute: 1.4 K/uL — ABNORMAL HIGH (ref 0.1–1.0)
Monocytes Relative: 7 %
Neutro Abs: 15 K/uL — ABNORMAL HIGH (ref 1.7–7.7)
Neutrophils Relative %: 78 %
Platelets: 287 K/uL (ref 150–400)
RBC: 3.21 MIL/uL — ABNORMAL LOW (ref 4.22–5.81)
RDW: 14.7 % (ref 11.5–15.5)
WBC: 19.2 K/uL — ABNORMAL HIGH (ref 4.0–10.5)
nRBC: 0 % (ref 0.0–0.2)

## 2024-08-23 LAB — COMPREHENSIVE METABOLIC PANEL WITH GFR
ALT: 18 U/L (ref 0–44)
AST: 53 U/L — ABNORMAL HIGH (ref 15–41)
Albumin: 2.9 g/dL — ABNORMAL LOW (ref 3.5–5.0)
Alkaline Phosphatase: 48 U/L (ref 38–126)
Anion gap: 14 (ref 5–15)
BUN: 64 mg/dL — ABNORMAL HIGH (ref 8–23)
CO2: 21 mmol/L — ABNORMAL LOW (ref 22–32)
Calcium: 7.9 mg/dL — ABNORMAL LOW (ref 8.9–10.3)
Chloride: 102 mmol/L (ref 98–111)
Creatinine, Ser: 3 mg/dL — ABNORMAL HIGH (ref 0.61–1.24)
GFR, Estimated: 20 mL/min — ABNORMAL LOW (ref >60)
Glucose, Bld: 419 mg/dL — ABNORMAL HIGH (ref 70–99)
Potassium: 4.2 mmol/L (ref 3.5–5.1)
Sodium: 137 mmol/L (ref 135–145)
Total Bilirubin: 0.9 mg/dL (ref 0.0–1.2)
Total Protein: 5.5 g/dL — ABNORMAL LOW (ref 6.5–8.1)

## 2024-08-23 LAB — CBC
HCT: 32.8 % — ABNORMAL LOW (ref 39.0–52.0)
Hemoglobin: 10.2 g/dL — ABNORMAL LOW (ref 13.0–17.0)
MCH: 31.3 pg (ref 26.0–34.0)
MCHC: 31.1 g/dL (ref 30.0–36.0)
MCV: 100.6 fL — ABNORMAL HIGH (ref 80.0–100.0)
Platelets: 223 K/uL (ref 150–400)
RBC: 3.26 MIL/uL — ABNORMAL LOW (ref 4.22–5.81)
RDW: 14.6 % (ref 11.5–15.5)
WBC: 11.4 K/uL — ABNORMAL HIGH (ref 4.0–10.5)
nRBC: 0 % (ref 0.0–0.2)

## 2024-08-23 LAB — ECHOCARDIOGRAM LIMITED
AV Mean grad: 20 mmHg
AV Peak grad: 34.3 mmHg
Ao pk vel: 2.93 m/s
Calc EF: 42.8 %
Height: 66 in
S' Lateral: 4.9 cm
Single Plane A2C EF: 44.2 %
Single Plane A4C EF: 34.6 %
Weight: 3051.17 [oz_av]

## 2024-08-23 LAB — TROPONIN I (HIGH SENSITIVITY)
Troponin I (High Sensitivity): 4487 ng/L (ref <18)
Troponin I (High Sensitivity): 3942 ng/L (ref <18)
Troponin I (High Sensitivity): 9934 ng/L (ref <18)
Troponin I (High Sensitivity): 16765 ng/L (ref <18)
Troponin I (High Sensitivity): 14753 ng/L

## 2024-08-23 LAB — BASIC METABOLIC PANEL WITH GFR
Anion gap: 13 (ref 5–15)
Anion gap: 22 — ABNORMAL HIGH (ref 5–15)
BUN: 63 mg/dL — ABNORMAL HIGH (ref 8–23)
BUN: 69 mg/dL — ABNORMAL HIGH (ref 8–23)
CO2: 22 mmol/L (ref 22–32)
CO2: 19 mmol/L — ABNORMAL LOW (ref 22–32)
Calcium: 8.5 mg/dL — ABNORMAL LOW (ref 8.9–10.3)
Calcium: 7.8 mg/dL — ABNORMAL LOW (ref 8.9–10.3)
Chloride: 103 mmol/L (ref 98–111)
Chloride: 101 mmol/L (ref 98–111)
Creatinine, Ser: 2.91 mg/dL — ABNORMAL HIGH (ref 0.61–1.24)
Creatinine, Ser: 3.85 mg/dL — ABNORMAL HIGH (ref 0.61–1.24)
GFR, Estimated: 21 mL/min — ABNORMAL LOW (ref >60)
GFR, Estimated: 15 mL/min — ABNORMAL LOW (ref >60)
Glucose, Bld: 146 mg/dL — ABNORMAL HIGH (ref 70–99)
Glucose, Bld: 449 mg/dL — ABNORMAL HIGH (ref 70–99)
Potassium: 4 mmol/L (ref 3.5–5.1)
Potassium: 4.9 mmol/L (ref 3.5–5.1)
Sodium: 138 mmol/L (ref 135–145)
Sodium: 142 mmol/L (ref 135–145)

## 2024-08-23 LAB — PHOSPHORUS
Phosphorus: 3.4 mg/dL (ref 2.5–4.6)
Phosphorus: 6.2 mg/dL — ABNORMAL HIGH (ref 2.5–4.6)

## 2024-08-23 LAB — HEMOGLOBIN AND HEMATOCRIT, BLOOD
HCT: 35.7 % — ABNORMAL LOW (ref 39.0–52.0)
Hemoglobin: 10.7 g/dL — ABNORMAL LOW (ref 13.0–17.0)

## 2024-08-23 LAB — GLUCOSE, CAPILLARY
Glucose-Capillary: 132 mg/dL — ABNORMAL HIGH (ref 70–99)
Glucose-Capillary: 203 mg/dL — ABNORMAL HIGH (ref 70–99)
Glucose-Capillary: 473 mg/dL — ABNORMAL HIGH (ref 70–99)
Glucose-Capillary: 374 mg/dL — ABNORMAL HIGH (ref 70–99)
Glucose-Capillary: 440 mg/dL — ABNORMAL HIGH (ref 70–99)
Glucose-Capillary: 430 mg/dL — ABNORMAL HIGH (ref 70–99)
Glucose-Capillary: 339 mg/dL — ABNORMAL HIGH (ref 70–99)
Glucose-Capillary: 420 mg/dL — ABNORMAL HIGH (ref 70–99)

## 2024-08-23 LAB — MAGNESIUM
Magnesium: 2.4 mg/dL (ref 1.7–2.4)
Magnesium: 2.5 mg/dL — ABNORMAL HIGH (ref 1.7–2.4)

## 2024-08-23 LAB — PROTIME-INR
INR: 1.2 (ref 0.8–1.2)
Prothrombin Time: 15.8 s — ABNORMAL HIGH (ref 11.4–15.2)

## 2024-08-23 LAB — BRAIN NATRIURETIC PEPTIDE: B Natriuretic Peptide: 749.1 pg/mL — ABNORMAL HIGH (ref 0.0–100.0)

## 2024-08-23 MED ORDER — AMIODARONE IV BOLUS ONLY 150 MG/100ML
INTRAVENOUS | Status: AC
  Filled 2024-08-23: qty 100

## 2024-08-23 MED ORDER — ONDANSETRON HCL 4 MG/2ML IJ SOLN: 4.0000 mg | Freq: Four times a day (QID) | INTRAMUSCULAR | Status: DC | PRN

## 2024-08-23 MED ORDER — PERFLUTREN LIPID MICROSPHERE
1.0000 mL | INTRAVENOUS | Status: AC | PRN
  Administered 2024-08-23: 5 mL via INTRAVENOUS

## 2024-08-23 MED ORDER — NOREPINEPHRINE 4 MG/250ML-% IV SOLN
0.0000 ug/min | INTRAVENOUS | Status: DC
  Administered 2024-08-23: 2 ug/min via INTRAVENOUS

## 2024-08-23 MED ORDER — ROCURONIUM BROMIDE 10 MG/ML (PF) SYRINGE: 100.0000 mg | PREFILLED_SYRINGE | Freq: Once | INTRAVENOUS | Status: AC

## 2024-08-23 MED ORDER — ORAL CARE MOUTH RINSE
15.0000 mL | OROMUCOSAL | Status: DC
  Administered 2024-08-23 (×4): 15 mL via OROMUCOSAL

## 2024-08-23 MED ORDER — LACTATED RINGERS IV BOLUS
1000.0000 mL | Freq: Once | INTRAVENOUS | Status: AC
Start: 2024-08-23 — End: 2024-08-23
  Administered 2024-08-23: 1000 mL via INTRAVENOUS

## 2024-08-23 MED ORDER — INSULIN REGULAR(HUMAN) IN NACL 100-0.9 UT/100ML-% IV SOLN
INTRAVENOUS | Status: DC
  Administered 2024-08-23: 5 [IU]/h via INTRAVENOUS
  Filled 2024-08-23: qty 100

## 2024-08-23 MED ORDER — INSULIN ASPART 100 UNIT/ML IJ SOLN
15.0000 [IU] | Freq: Once | INTRAMUSCULAR | Status: AC
  Administered 2024-08-23: 15 [IU] via SUBCUTANEOUS

## 2024-08-23 MED ORDER — ROCURONIUM BROMIDE 10 MG/ML (PF) SYRINGE
PREFILLED_SYRINGE | INTRAVENOUS | Status: AC
  Administered 2024-08-23: 100 mg
  Filled 2024-08-23: qty 10

## 2024-08-23 MED ORDER — NOREPINEPHRINE 4 MG/250ML-% IV SOLN
INTRAVENOUS | Status: AC
  Administered 2024-08-23: 4 mg
  Filled 2024-08-23: qty 250

## 2024-08-23 MED ORDER — PHENYLEPHRINE HCL-NACL 20-0.9 MG/250ML-% IV SOLN
0.0000 ug/min | INTRAVENOUS | Status: DC
  Administered 2024-08-23: 400 ug/min via INTRAVENOUS
  Administered 2024-08-23: 20 ug/min via INTRAVENOUS
  Filled 2024-08-23 (×4): qty 250

## 2024-08-23 MED ORDER — MORPHINE 100MG IN NS 100ML (1MG/ML) PREMIX INFUSION
5.0000 mg/h | INTRAVENOUS | Status: DC
  Administered 2024-08-23: 5 mg/h via INTRAVENOUS
  Filled 2024-08-23: qty 100

## 2024-08-23 MED ORDER — ETOMIDATE 2 MG/ML IV SOLN
INTRAVENOUS | Status: AC
  Filled 2024-08-23: qty 10

## 2024-08-23 MED ORDER — MIDAZOLAM HCL 2 MG/2ML IJ SOLN: 2.0000 mg | INTRAMUSCULAR | Status: DC | PRN

## 2024-08-23 MED ORDER — ETOMIDATE 2 MG/ML IV SOLN
INTRAVENOUS | Status: AC
  Administered 2024-08-23: 20 mg
  Filled 2024-08-23: qty 10

## 2024-08-23 MED ORDER — AMIODARONE LOAD VIA INFUSION
150.0000 mg | Freq: Once | INTRAVENOUS | Status: AC
Start: 2024-08-23 — End: 2024-08-23
  Administered 2024-08-23: 150 mg via INTRAVENOUS
  Filled 2024-08-23: qty 83.34

## 2024-08-23 MED ORDER — MIDAZOLAM HCL 2 MG/2ML IJ SOLN
INTRAMUSCULAR | Status: AC
  Administered 2024-08-23: 2 mg
  Filled 2024-08-23: qty 4

## 2024-08-23 MED ORDER — VASOPRESSIN 20 UNITS/100 ML INFUSION FOR SHOCK
0.0000 [IU]/min | INTRAVENOUS | Status: DC
  Administered 2024-08-23 (×2): 0.04 [IU]/min via INTRAVENOUS
  Filled 2024-08-23 (×2): qty 100

## 2024-08-23 MED ORDER — FENTANYL 2500MCG IN NS 250ML (10MCG/ML) PREMIX INFUSION
INTRAVENOUS | Status: AC
  Filled 2024-08-23: qty 250

## 2024-08-23 MED ORDER — FENTANYL 2500MCG IN NS 250ML (10MCG/ML) PREMIX INFUSION
0.0000 ug/h | INTRAVENOUS | Status: DC
  Administered 2024-08-23 (×2): 100 ug/h via INTRAVENOUS

## 2024-08-23 MED ORDER — SODIUM BICARBONATE 8.4 % IV SOLN
INTRAVENOUS | Status: AC
  Filled 2024-08-23: qty 50

## 2024-08-23 MED ORDER — AMIODARONE HCL IN DEXTROSE 360-4.14 MG/200ML-% IV SOLN: 30.0000 mg/h | INTRAVENOUS | Status: DC

## 2024-08-23 MED ORDER — PROPOFOL 1000 MG/100ML IV EMUL
INTRAVENOUS | Status: AC
  Administered 2024-08-23: 10 ug/kg/min
  Filled 2024-08-23: qty 100

## 2024-08-23 MED ORDER — NOREPINEPHRINE 4 MG/250ML-% IV SOLN: 0.0000 ug/min | INTRAVENOUS | Status: DC

## 2024-08-23 MED ORDER — MIDAZOLAM HCL 2 MG/2ML IJ SOLN: 4.0000 mg | Freq: Once | INTRAMUSCULAR | Status: AC

## 2024-08-23 MED ORDER — SODIUM CHLORIDE 0.9 % IV SOLN: 250.0000 mL | INTRAVENOUS | Status: DC

## 2024-08-23 MED ORDER — ORAL CARE MOUTH RINSE: 15.0000 mL | OROMUCOSAL | Status: DC | PRN

## 2024-08-23 MED ORDER — PANTOPRAZOLE SODIUM 40 MG IV SOLR: 40.0000 mg | INTRAVENOUS | Status: DC

## 2024-08-23 MED ORDER — SODIUM BICARBONATE 8.4 % IV SOLN
100.0000 meq | Freq: Once | INTRAVENOUS | Status: AC
  Administered 2024-08-23: 100 meq via INTRAVENOUS
  Filled 2024-08-23: qty 100

## 2024-08-23 MED ORDER — SODIUM BICARBONATE 8.4 % IV SOLN
100.0000 meq | Freq: Once | INTRAVENOUS | Status: AC
Start: 2024-08-23 — End: 2024-08-23
  Administered 2024-08-23: 100 meq via INTRAVENOUS

## 2024-08-23 MED ORDER — ALBUMIN HUMAN 25 % IV SOLN: 12.5000 g | Freq: Once | INTRAVENOUS | Status: AC

## 2024-08-23 MED ORDER — DEXTROSE IN LACTATED RINGERS 5 % IV SOLN: INTRAVENOUS | Status: DC

## 2024-08-23 MED ORDER — TRANEXAMIC ACID FOR INHALATION
500.0000 mg | Freq: Three times a day (TID) | RESPIRATORY_TRACT | Status: DC
  Administered 2024-08-23: 500 mg via RESPIRATORY_TRACT
  Filled 2024-08-23 (×3): qty 10

## 2024-08-23 MED ORDER — PROPOFOL 1000 MG/100ML IV EMUL: 0.0000 ug/kg/min | INTRAVENOUS | Status: DC

## 2024-08-23 MED ORDER — ALBUMIN HUMAN 25 % IV SOLN
INTRAVENOUS | Status: AC
  Filled 2024-08-23: qty 50

## 2024-08-23 MED ORDER — NOREPINEPHRINE 16 MG/250ML-% IV SOLN
0.0000 ug/min | INTRAVENOUS | Status: DC
  Administered 2024-08-23: 48 ug/min via INTRAVENOUS
  Administered 2024-08-23: 5 ug/min via INTRAVENOUS
  Filled 2024-08-23 (×2): qty 250

## 2024-08-23 MED ORDER — ONDANSETRON HCL 4 MG/2ML IJ SOLN
INTRAMUSCULAR | Status: AC
  Administered 2024-08-23: 4 mg
  Filled 2024-08-23: qty 2

## 2024-08-23 MED ORDER — POLYETHYLENE GLYCOL 3350 17 G PO PACK: 17.0000 g | PACK | Freq: Every day | ORAL | Status: DC

## 2024-08-23 MED ORDER — ETOMIDATE 2 MG/ML IV SOLN: 20.0000 mg | Freq: Once | INTRAVENOUS | Status: AC

## 2024-08-23 MED ORDER — DOCUSATE SODIUM 50 MG/5ML PO LIQD: 100.0000 mg | Freq: Two times a day (BID) | ORAL | Status: DC

## 2024-08-23 MED ORDER — CALCIUM CHLORIDE 10 % IV SOLN
1.0000 g | Freq: Once | INTRAVENOUS | Status: AC
  Administered 2024-08-23: 1 g via INTRAVENOUS
  Filled 2024-08-23: qty 10

## 2024-08-23 MED ORDER — AMIODARONE HCL IN DEXTROSE 360-4.14 MG/200ML-% IV SOLN
60.0000 mg/h | INTRAVENOUS | Status: AC
  Administered 2024-08-23 (×2): 60 mg/h via INTRAVENOUS
  Filled 2024-08-23 (×3): qty 200

## 2024-08-24 ENCOUNTER — Encounter: Admission: RE | Payer: Self-pay | Source: Home / Self Care

## 2024-08-24 ENCOUNTER — Inpatient Hospital Stay: Admission: RE | Admit: 2024-08-24 | Source: Home / Self Care | Admitting: Vascular Surgery

## 2024-08-24 DIAGNOSIS — I6521 Occlusion and stenosis of right carotid artery: Secondary | ICD-10-CM

## 2024-08-24 LAB — COOXEMETRY PANEL
Carboxyhemoglobin: 2.3 % — ABNORMAL HIGH (ref 0.5–1.5)
Methemoglobin: 0 % (ref 0.0–1.5)
O2 Saturation: 88.2 %
Total hemoglobin: 10.3 g/dL — ABNORMAL LOW (ref 12.0–16.0)
Total oxygen content: 86.2 %

## 2024-08-24 LAB — BLOOD GAS, VENOUS
Acid-Base Excess: 2.2 mmol/L — ABNORMAL HIGH (ref 0.0–2.0)
Bicarbonate: 27.3 mmol/L (ref 20.0–28.0)
O2 Saturation: 41.5 %
Patient temperature: 37
pCO2, Ven: 43 mmHg — ABNORMAL LOW (ref 44–60)
pH, Ven: 7.41 (ref 7.25–7.43)

## 2024-08-24 LAB — LEGIONELLA PNEUMOPHILA SEROGP 1 UR AG: L. pneumophila Serogp 1 Ur Ag: NEGATIVE

## 2024-08-24 SURGERY — CAROTID PTA/STENT INTERVENTION
Anesthesia: Moderate Sedation | Laterality: Right

## 2024-08-25 LAB — CULTURE, BAL-QUANTITATIVE W GRAM STAIN: Culture: NO GROWTH

## 2024-08-27 LAB — CULTURE, BLOOD (SINGLE)
Culture: NO GROWTH
Special Requests: ADEQUATE

## 2024-09-01 ENCOUNTER — Other Ambulatory Visit

## 2024-09-08 ENCOUNTER — Ambulatory Visit: Admitting: Oncology

## 2024-09-08 ENCOUNTER — Ambulatory Visit

## 2024-09-09 NOTE — Discharge Summary (Signed)
   Death Summary   Mike Decker FMW:[id number] DOB: [birth year] DOA: 10-Sep-2024  PCP: Mike Fish, NP  Admit date: 2024-09-10 Date of Death: 09/12/2024  Final Diagnoses:  Principal Problem:   Hemoptysis  TIME of DEATH -1857  History of present illness:  83 y.o male with significant PMH of Barrett's esophagus, Esophageal stricture s/p dilation 2024, CAD s/p PCI w/DES RCA, prox LCx, HFpEF, Atrial Fibrillation on Eliquis, CKD stage IV, T2DM, carotid stenosis, COPD, OSA on CPAP and prostate cancer who presented to the ED with chief complaints of shortness of breath and hemoptysis.   On review of chart, patient was seen in the ED yesterday 9/12 for ongoing central chest pain with radiation to the left shoulder.  Workup was unrevealing, he was therefore treated with nitroglycerin , GI cocktail and discharged home with instructions to return to the ED with worsening symptoms.  Patient presented to the ED with shortness of breath that woke him up around 11:30 PM.  He then developed productive cough which was initially pink then he later noticed that he was frank blood.  Patient was concerned given his recent weight gain of about 8 pounds that he was in heart failure he therefore called EMS.   ED Course: Initial vital signs showed HR of 65 beats/minute, BP 116/44 mm Hg, the RR 13 breaths/minute, and the oxygen saturation 94% on RA and a temperature of 98.42F (36.9C). Pertinent Labs/Diagnostics Findings: Glucose: 206 BUN/Cr.:  70/2.88 WBC: 11.2 K/L Hgb/Hct: 10.3/32.3 COVID PCR: Negative,  troponin: 53 BNP: 344.3 CTA Chest>findings concerning for asymmetric pulmonary hemorrhage or developing mnemonic consolidation.   Patient received 1 g TXA IV and admitted to [facility] service   09/11/2024- patient had episode of chest pain overnight and abnormal EKG and elevation of troponin. Cardiology was able to see him and is familiar with patient. He is not a candidate for LHC due to high risk of loss  of life.  He is not able to have anticoagulation due to hemoptysis.  He was intubated due to ongoing hemoptysis and MI. His wife is at bedside and I personally know her we discussed findings and at this time we are in agreement to hold off on anticoagulation.  He is on 100% Fio2 with ciruculatory shock maxed out on levophed  and vasopressin .  His prognosis is poor.   Patient had a very difficult presentation with hemoptysis and hypoxemia he did receive IV txa in ER and had bronchoscopy with ice cold lavage revealing multifocal oozing from cappilary beds most likely due to over anticoagulation in context of CKD.  Unfortunately he does have signicant cardiac dysfunction with CAD and PCI he had abnormal EGK and elevated troponin consistent with MI but cardiology was unable to perform LHC or offer antiplatelet therapy or anticoagulation due to severe hypoxemia.  He developed shock and required maximal vasopressor support but did not respond to therapy.    After multiple conversation with wife POA annna Decker it was decided patient goals would not be in with escalation or any additional aggressive therapy and patient was placed on comfort measures with DNR code status by wife.    Signed:    Khyrie Decker, M.D.  Pulmonary & Critical Care Medicine  [facility]

## 2024-09-09 NOTE — Progress Notes (Signed)
   08/17/2024 1830  Spiritual Encounters  Type of Visit Initial  Care provided to: Family  Conversation partners present during encounter Nurse  Referral source Other (comment) (End of Life)  Reason for visit End-of-life  OnCall Visit Yes   Chaplain was paged to the patient's room for end of life.  Chaplain found wife at bedside and offered a compassionate presence as staff extubated the patient.  Chaplain used reflective listening as wife shared stories from their marriage and more about their family.  Chaplain prayed with patient's wife and left the space when patient's minister arrived.    Rev. Rana M. Nicholaus, M.Div. Chaplain Resident [facility]

## 2024-09-09 NOTE — Procedures (Addendum)
 INTUBATION PROCEDURE NOTE  Mike Decker  [id number]  1941-02-13  Date:08/25/2024  Time:7:28 AM   Provider Performing:Amandajo Gonder A Karrington Studnicka   Procedure: Intubation (31500)  Indication(s) Respiratory Failure  Consent Risks of the procedure as well as the alternatives and risks of each were explained to the patient and/or caregiver.  Consent for the procedure was obtained and is signed in the bedside chart  Anesthesia Etomidate , Versed , Fentanyl , and Rocuronium   Time Out Verified patient identification, verified procedure, site/side was marked, verified correct patient position, special equipment/implants available, medications/allergies/relevant history reviewed, required imaging and test results available.  Sterile Technique Usual hand hygeine, masks, and gloves were used  Procedure Description Patient positioned in bed supine.  Sedation given as noted above.  Patient was intubated with endotracheal tube using Glidescope.  View was Grade 1 full glottis .  Number of attempts was 1.  Colorimetric CO2 detector was consistent with tracheal placement.  Complications/Tolerance None; patient tolerated the procedure well. Chest X-ray is ordered to verify placement.  EBL Minimal  Specimen(s) None  Almarie Nose, DNP, CCRN, FNP-C, AGACNP-BC Acute Care & Family Nurse Practitioner  [facility]  See Amion for personal pager PCCM on call pager [phone] until 7 am

## 2024-09-09 NOTE — Progress Notes (Signed)
  Echocardiogram 2D Echocardiogram has been performed. A Limited Echo was requested with limited doppler.  Mike Decker Louder 08/13/2024, 1:46 PM

## 2024-09-09 NOTE — Procedures (Signed)
 Arterial Catheter Insertion Procedure Note  Mike Decker  [id number]  [birth year]  Date:09/08/2024  Time:2:59 PM    Provider Performing: Lonell KANDICE Moose    Procedure: Insertion of Arterial Line (63379) with US  guidance (23062)   Indication(s) Blood pressure monitoring and/or need for frequent ABGs  Consent Risks of the procedure as well as the alternatives and risks of each were explained to the patient and/or caregiver.  Consent for the procedure was obtained and is signed in the bedside chart  Anesthesia None   Time Out Verified patient identification, verified procedure, site/side was marked, verified correct patient position, special equipment/implants available, medications/allergies/relevant history reviewed, required imaging and test results available.   Sterile Technique Maximal sterile technique including full sterile barrier drape, hand hygiene, sterile gown, sterile gloves, mask, hair covering, sterile ultrasound probe cover (if used).   Procedure Description Area of catheter insertion was cleaned with chlorhexidine  and draped in sterile fashion. With real-time ultrasound guidance an arterial catheter was placed into the right radial artery.  Appropriate arterial tracings confirmed on monitor.     Complications/Tolerance None; patient tolerated the procedure well.   EBL Minimal   Specimen(s) None  Lonell Moose, AGNP  Pulmonary/Critical Care Pager [phone] (please enter 7 digits) PCCM Consult Pager [phone] (please enter 7 digits)

## 2024-09-09 NOTE — Progress Notes (Signed)
Pt. Extubated to room air. 

## 2024-09-09 NOTE — Procedures (Signed)
 Central Venous Catheter Insertion Procedure Note  Mike Decker  [id number]  Mar 30, 1941  Date:09/08/2024  Time:8:08 AM   Provider Performing:Armoni Kludt KANDICE Moose   Procedure: Insertion of Non-tunneled Central Venous Catheter(36556) with US  guidance (23062)   Indication(s) Medication administration and Difficult access  Consent Risks of the procedure as well as the alternatives and risks of each were explained to the patient and/or caregiver.  Consent for the procedure was obtained and is signed in the bedside chart  Anesthesia Topical only with 1% lidocaine    Timeout Verified patient identification, verified procedure, site/side was marked, verified correct patient position, special equipment/implants available, medications/allergies/relevant history reviewed, required imaging and test results available.  Sterile Technique Maximal sterile technique including full sterile barrier drape, hand hygiene, sterile gown, sterile gloves, mask, hair covering, sterile ultrasound probe cover (if used).  Procedure Description Area of catheter insertion was cleaned with chlorhexidine  and draped in sterile fashion.  With real-time ultrasound guidance a central venous catheter was placed into the left internal jugular vein. Nonpulsatile blood flow and easy flushing noted in all ports.  The catheter was sutured in place and sterile dressing applied.  Complications/Tolerance None; patient tolerated the procedure well. Chest X-ray is ordered to verify placement for internal jugular or subclavian cannulation.   Chest x-ray is not ordered for femoral cannulation.  EBL Minimal  Specimen(s) None  Lonell Moose, AGNP  Pulmonary/Critical Care Pager [phone] (please enter 7 digits) PCCM Consult Pager [phone] (please enter 7 digits)

## 2024-09-09 NOTE — Progress Notes (Signed)
 Notified by RN this morning that Patient has denied pain this shift except for when he coughs, this morning he is reporting chest pain 6/10 that is easing off since PRN Morphine  given. On bedside assessment, patient report mid sternal chest pain that is radiating to left arm with associated nausea. Repeat labs shows significant elevation in HsTroponin 4487 and BNP is 749. A STAT EKG obtained with findings of st depression in septal and anterior leads concerning for ischemia. Discussed findings with oncall cardiologist Dr. Florencio who will evaluate patient for possible intervention. Patient intubated for airway protection per attending's Dr. Braxton recommendations due to high risk for decompensation during procedure.   Almarie Nose, DNP, CCRN, FNP-C, AGACNP-BC Acute Care & Family Nurse Practitioner  [place]  See Amion for personal pager PCCM on call pager [phone] until 7 am

## 2024-09-09 NOTE — Progress Notes (Signed)
   08/17/2024 0900  Spiritual Encounters  Type of Visit Initial  Care provided to: [facility] partners present during encounter Nurse  Referral source Code page (Code [facility])  Reason for visit Code  OnCall Visit Yes   Chaplain responded to a Code [employer].  Staff was working with patient and his wife was in the waiting room with her minister.  Staff asked Chaplain to just let the patient's wife know that her provider would be out to speak to her.  Chaplain met wife, shared this and gave her some water at her request.  Since patient's wife was pleased with her own minister being present, Chaplain asked that the patient's wife page her if she needed any additional spiritual care.    Rev. Rana M. Nicholaus, M.Div. Chaplain Resident [facility]

## 2024-09-09 NOTE — Consult Note (Signed)
 PHARMACY CONSULT NOTE - ELECTROLYTES  Pharmacy Consult for Electrolyte Monitoring and Replacement   Recent Labs: Height: 5' 6 (167.6 cm) Weight: 86.5 kg (190 lb 11.2 oz) IBW/kg (Calculated) : 63.8 Estimated Creatinine Clearance: 19.5 mL/min (A) (by C-G formula based on SCr of 2.91 mg/dL (H)). Potassium (mmol/L)  Date Value  08/27/2024 4.0  11/03/2014 4.6   Magnesium  (mg/dL)  Date Value  90/85/7974 2.4   Calcium  (mg/dL)  Date Value  90/85/7974 8.5 (L)   Calcium , Total (mg/dL)  Date Value  88/74/7984 8.9   Albumin  (g/dL)  Date Value  90/86/7974 3.6   Phosphorus (mg/dL)  Date Value  90/85/7974 3.4   Sodium (mmol/L)  Date Value  08/24/2024 138  11/03/2014 139    Assessment  Mike Decker is a 83 y.o. male presenting with shortness of breath and hemoptysis. PMH significant for Barrett's esophagus, Esophageal stricture s/p dilation 2024, CAD s/p PCI w/DES RCA, prox LCx, HFpEF, Atrial Fibrillation on Eliquis, CKD stage IV, T2DM, carotid stenosis, COPD, OSA on CPAP and prostate cancer . Pharmacy has been consulted to monitor and replace electrolytes.  Diet: NPO MIVF: N/A Pertinent medications: N/A  Goal of Therapy: Electrolytes WNL  Plan:  No replacement indicated Check BMP, Mg, Phos with AM labs  Thank you for allowing pharmacy to be a part of this patient's care.  Avrum Kimball A Jamesyn Lindell, PharmD Clinical Pharmacist 08/30/2024 7:24 AM

## 2024-09-09 NOTE — Progress Notes (Signed)
 Significant Event   Pt cardiac arrested at 09:16 am cardiac rhythm pulseless slow ventricular tachycardia.  ACLS protocol initiated: pt received 1 amp of epinephrine , 1 amp of sodium bicarb, and 150 mg iv amiodarone  bolus.  ROSC achieved at 09:19 am.  Cardiology PA at bedside during cardiac arrest.  Pts wife updated regarding decline in pts condition and current plan of care.  Will continue to monitor and assess pt.  Lonell Moose, AGNP  Pulmonary/Critical Care Pager [phone] (please enter 7 digits) PCCM Consult Pager [phone] (please enter 7 digits)

## 2024-09-09 NOTE — Progress Notes (Addendum)
 NAME:  Mike Decker, MRN:  [id number], DOB:  1941/04/11, LOS: 1 ADMISSION DATE:  08/22/2024, CONSULTATION DATE: 08/22/2024 REFERRING MD: Gordan Huxley, CHIEF COMPLAINT: Hemoptysis  HPI  83 y.o male with significant PMH of Barrett's esophagus, Esophageal stricture s/p dilation 2024, CAD s/p PCI w/DES RCA, prox LCx, HFpEF, Atrial Fibrillation on Eliquis, CKD stage IV, T2DM, carotid stenosis, COPD, OSA on CPAP and prostate cancer who presented to the ED with chief complaints of shortness of breath and hemoptysis.  On review of chart, patient was seen in the ED yesterday 9/12 for ongoing central chest pain with radiation to the left shoulder.  Workup was unrevealing, he was therefore treated with nitroglycerin , GI cocktail and discharged home with instructions to return to the ED with worsening symptoms.  Patient presented to the ED with shortness of breath that woke him up around 11:30 PM.  He then developed productive cough which was initially pink then he later noticed that he was frank blood.  Patient was concerned given his recent weight gain of about 8 pounds that he was in heart failure he therefore called EMS.   ED Course: Initial vital signs showed HR of 65 beats/minute, BP 116/44 mm Hg, the RR 13 breaths/minute, and the oxygen saturation 94% on RA and a temperature of 98.82F (36.9C). Pertinent Labs/Diagnostics Findings: Glucose: 206 BUN/Cr.:  70/2.88 WBC: 11.2 K/L Hgb/Hct: 10.3/32.3 COVID PCR: Negative,  troponin: 53 BNP: 344.3 CTA Chest>findings concerning for asymmetric pulmonary hemorrhage or developing mnemonic consolidation.  Patient received 1 g TXA IV and admitted to [facility] service  09/01/2024- patient had episode of chest pain overnight and abnormal EKG and elevation of troponin. Cardiology was able to see him and is familiar with patient. He is not a candidate for LHC due to high risk of loss of life.  He is not able to have anticoagulation due to hemoptysis.  He was  intubated due to ongoing hemoptysis and MI. His wife is at bedside and I personally know her we discussed findings and at this time we are in agreement to hold off on anticoagulation.  He is on 100% Fio2 with ciruculatory shock maxed out on levophed  and vasopressin .  His prognosis is poor.    Past Medical History  Barrett's esophagus, Esophageal stricture s/p dilation 2024, CAD s/p PCI w/DES RCA, prox LCx, HFpEF, Atrial Fibrillation on Eliquis, CKD stage IV, T2DM, carotid stenosis, COPD, OSA on CPAP and prostate cancer  Significant Hospital Events   9/13: Admitted with acute hypoxic respiratory failure ISO suspected pulmonary hemorrhage versus developing pneumonia  Consults:  Cardiology Vascular   Micro Data:  9/13: SARS-CoV-2 PCR> negative 9/13: Influenza PCR> negative 9/13: Blood culture x2> 9/13: Respiratory viral panel > 9/13: MRSA PCR>>  9/13: Strep pneumo urinary antigen> 9/13: Legionella urinary antigen>  Antimicrobials:  None  OBJECTIVE  Blood pressure (!) 150/78, pulse (!) 127, temperature 98 F (36.7 C), temperature source Oral, resp. rate 16, height 5' 6 (1.676 m), weight 86.5 kg, SpO2 93%.    Vent Mode: PRVC FiO2 (%):  [100 %] 100 % Set Rate:  [16 bmp] 16 bmp Vt Set:  [500 mL] 500 mL PEEP:  [5 cmH20] 5 cmH20   Intake/Output Summary (Last 24 hours) at 08/24/2024 1009 Last data filed at 09/02/2024 0700 Gross per 24 hour  Intake 983.03 ml  Output 1300 ml  Net -316.97 ml   Filed Weights   08/22/24 0150 08/22/24 0600 08/18/2024 0500  Weight: 88.4 kg 82.7 kg 86.5 kg  Physical Examination  GEN: Critically ill patient, WDWN in NAD HEENT: [place]/AT. PERRL, sclerae anicteric. HEART: Irregular rhythm, normal rate, S1, S2, no M/R/G,  LUNGS: CTAB, mild wheezes, no increased WOB,  EXTREMITIES: No Edema, cap refill  NEURO: No gross focal deficits. PSYCH:  Mood and Affect: Mood normal.  ABDOMINAL: Soft: BS x 4, NTND SKIN: Intact, warm, no rashes lesion, or  ulcer  Labs/imaging that I havepersonally reviewed  (right click and Reselect all SmartList Selections daily)    DG Chest Port 1 View Result Date: 09/02/2024 CLINICAL DATA:  83 year old male with respiratory illness. Respiratory failure. EXAM: PORTABLE CHEST 1 VIEW COMPARISON:  Portable chest yesterday and earlier. FINDINGS: Portable AP semi upright view at 0539 hours. Ongoing low and slightly lower lung volumes compared to yesterday. Stable cardiac size and mediastinal contours. Visualized tracheal air column is within normal limits. No pneumothorax or pleural effusion. Diffuse coarse bilateral pulmonary interstitial opacity, no improvement. And increasing confluent retrocardiac opacity. Paucity of bowel gas. No acute osseous abnormality identified. IMPRESSION: 1. Ongoing coarse diffuse interstitial opacity with lower lung volumes. 2. Increasingly confluent lung base opacity. Electronically Signed   By: VEAR Hurst M.D.   On: 08/10/2024 08:51   DG Abd 1 View Result Date: 09/02/2024 CLINICAL DATA:  83 year old male with respiratory illness. Respiratory failure. Intubated. Enteric tube placement. EXAM: ABDOMEN - 1 VIEW COMPARISON:  Portable chest x-ray reported the same time. CT Abdomen and Pelvis 12/16/2019. FINDINGS: Portable AP semi upright view at 0828 hours. Enteric tube placed into the stomach, side hole is at the level of the distal gastric body. Partially confluent opacity at the visible left lung base. Increased bowel gas compared to 2021 CT but nonobstructed bowel gas pattern. No acute osseous abnormality identified. IMPRESSION: 1. Satisfactory enteric tube placement into the stomach. 2. Nonobstructed bowel-gas pattern. Electronically Signed   By: VEAR Hurst M.D.   On: 08/27/2024 08:50   DG Chest Port 1 View Result Date: 08/17/2024 CLINICAL DATA:  83 year old male with respiratory illness. Respiratory failure. Intubated. EXAM: PORTABLE CHEST 1 VIEW COMPARISON:  Portable chest 0539 hours today.  FINDINGS: Portable AP semi upright view at 0823 hours. Endotracheal tube placed in good position between the level the clavicles and carina. Enteric tube courses to the left abdomen, tip not included. New left IJ central line in place, tip at the carina, SVC level. Mildly rotated to the left. Stable cardiac size and mediastinal contours. Mildly improved lung volumes. Increased confluent left lung base opacity now obscuring the hemidiaphragm. Coarse bilateral pulmonary interstitial opacity otherwise stable. No pneumothorax or pleural effusion identified. Paucity of bowel gas. Stable visualized osseous structures. IMPRESSION: 1. Intubated with satisfactory visible lines and tubes. No pneumothorax. 2. Left lung base collapse or consolidation now. Otherwise stable diffuse, coarse pulmonary interstitial opacity. Electronically Signed   By: VEAR Hurst M.D.   On: 08/17/2024 08:49   DG Chest Port 1 View Result Date: 08/22/2024 CLINICAL DATA:  Acute respiratory failure. EXAM: PORTABLE CHEST 1 VIEW COMPARISON:  Chest radiograph dated 08/22/2024. FINDINGS: Diffuse chronic intra coarsening. No focal consolidation, pleural effusion or pneumothorax. Stable cardiac silhouette. No acute osseous pathology. IMPRESSION: No active disease. Electronically Signed   By: Vanetta Chou M.D.   On: 08/22/2024 16:18   CT Chest Wo Contrast Result Date: 08/22/2024 EXAM: CT CHEST WITHOUT CONTRAST 08/22/2024 03:26:47 AM TECHNIQUE: CT of the chest was performed without the administration of intravenous contrast. Multiplanar reformatted images are provided for review. Automated exposure control, iterative reconstruction, and/or weight  based adjustment of the mA/kV was utilized to reduce the radiation dose to as low as reasonably achievable. COMPARISON: None available. CLINICAL HISTORY: Respiratory illness, nondiagnostic xray; suspect pulmonary hemorrhage (hemoptysis on Eliquis) but patient has severe CKD. Pt arrived via [facility] ([facility])  from home c/o SOB, especially with exertion. Pt has consistent cough and hemoptysis. Pt has chest pain only with cough. Pt on Eliquis. FINDINGS: MEDIASTINUM: Extensive calcification of the aortic valve leaflets. Extensive multivessel coronary artery calcification. Cardiac size within normal limits. Hypoattenuation of the cardiac blood pool in keeping with at least mild anemia. No pericardial effusion. Enlarged moderate atherosclerotic calcification within the thoracic aorta. No aortic aneurysm. LYMPH NODES: No mediastinal, hilar or axillary lymphadenopathy. LUNGS AND PLEURA: Underlying subpleural pulmonary fibrotic change is again identified. There is developing smooth interlobular septal thickening best appreciated at the lung bases in keeping with trace interstitial pulmonary edema. There are development of asymmetric ground glass pulmonary infiltrates throughout the right lung which may reflect asymmetric pulmonary hemorrhage in this patient with clinical hemoptysis or developing pneumonic consolidation. Trace bilateral pleural effusions are present. No pneumothorax. No central obstructing lesion. Thickening of the peribronchovascular interstitium asymmetrically involving the right lung which is in keeping with probable airway inflammation. SOFT TISSUES/BONES: No acute abnormality of the bones or soft tissues. UPPER ABDOMEN: A small hiatal hernia. IMPRESSION: 1. Asymmetric ground glass pulmonary infiltrates throughout the right lung, possibly representing asymmetric pulmonary hemorrhage or developing pneumonic consolidation. 2. Trace interstitial pulmonary edema and trace bilateral pleural effusions. 3. Underlying subpleural pulmonary fibrotic change. 4. Extensive calcification of the aortic valve leaflets and multivessel coronary arteries. Electronically signed by: Dorethia Molt MD 08/22/2024 03:53 AM EDT RP Workstation: [id number]   DG Chest Port 1 View Result Date: 08/22/2024 EXAM: 1 VIEW XRAY OF THE CHEST  08/22/2024 02:03:05 AM COMPARISON: 08/21/2024 CLINICAL HISTORY: Questionable sepsis - evaluate for abnormality. c/o SOB, especially with exertion. Pt has consistent cough and hemoptysis. Pt has chest pain only with cough. Pt on Eliquis. FINDINGS: LUNGS AND PLEURA: Increased right lung airspace opacities superimposed on chronic interstitial coarsening. This can be seen with developing pneumonic consolidation or asymmetric pulmonary hemorrhage. No pleural effusion. No pneumothorax. HEART AND MEDIASTINUM: No acute abnormality of the cardiac and mediastinal silhouettes. BONES AND SOFT TISSUES: No acute osseous abnormality. IMPRESSION: 1. Increased right lung airspace opacities superimposed on chronic interstitial coarsening, possibly representing developing pneumonic consolidation or asymmetric pulmonary hemorrhage. Electronically signed by: Dorethia Molt MD 08/22/2024 02:09 AM EDT RP Workstation: [id number]    Labs   CBC: Recent Labs  Lab 08/21/24 0448 08/22/24 0156 08/22/24 0515 08/11/2024 0329 08/31/2024 0929  WBC 10.4 11.2* 10.5 11.4* 19.2*  NEUTROABS 7.7 8.6*  --   --  15.0*  HGB 10.7* 10.3* 9.9* 10.2* 10.3*  HCT 32.1* 32.3* 30.4* 32.8* 33.2*  MCV 98.5 99.7 98.4 100.6* 103.4*  PLT 236 243 219 223 287   Basic Metabolic Panel: Recent Labs  Lab 08/21/24 0448 08/22/24 0156 08/22/24 0515 08/28/2024 0329  NA 139 138 139 138  K 3.5 4.2 4.5 4.0  CL 103 103 106 103  CO2 24 22 24 22   GLUCOSE 211* 206* 213* 146*  BUN 71* 70* 73* 63*  CREATININE 2.95* 2.88* 2.98* 2.91*  CALCIUM  8.4* 8.4* 8.2* 8.5*  MG  --   --  2.2 2.4  PHOS  --   --  3.3 3.4   GFR: Estimated Creatinine Clearance: 19.5 mL/min (A) (by C-G formula based on SCr of 2.91 mg/dL (H)). Recent  Labs  Lab 08/22/24 0156 08/22/24 0515 08/13/2024 0329 09/08/2024 0929  PROCALCITON  --  0.11  --   --   WBC 11.2* 10.5 11.4* 19.2*  LATICACIDVEN 1.7  --   --   --    Liver Function Tests: Recent Labs  Lab 08/21/24 0448 08/22/24 0156  AST  18 20  ALT 13 14  ALKPHOS 50 48  BILITOT 0.8 0.9  PROT 6.4* 6.4*  ALBUMIN  3.6 3.6   Recent Labs  Lab 08/21/24 0448 08/22/24 0156  LIPASE 55* 56*   No results for input(s): AMMONIA in the last 168 hours.  ABG    Component Value Date/Time   PHART 7.11 (LL) 09/04/2024 0924   PCO2ART 71 (HH) 09/08/2024 0924   PO2ART 59 (L) 09/04/2024 0924   HCO3 22.6 09/04/2024 0924   ACIDBASEDEF 8.2 (H) 08/26/2024 0924   O2SAT 85.3 08/10/2024 0924   Coagulation Profile: Recent Labs  Lab 08/22/24 0156  INR 1.0   Cardiac Enzymes: No results for input(s): CKTOTAL, CKMB, CKMBINDEX, TROPONINI in the last 168 hours.  HbA1C: Hemoglobin A1C  Date/Time Value Ref Range Status  01/22/2024 12:00 AM 7.6  Final  07/18/2021 12:00 AM 8.3  Final   Hgb A1c MFr Bld  Date/Time Value Ref Range Status  08/22/2024 05:15 AM 6.6 (H) 4.8 - 5.6 % Final    Comment:    (NOTE) Diagnosis of Diabetes The following HbA1c ranges recommended by the American Diabetes Association (ADA) may be used as an aid in the diagnosis of diabetes mellitus.  Hemoglobin             Suggested A1C NGSP%              Diagnosis  <5.7                   Non Diabetic  5.7-6.4                Pre-Diabetic  >6.4                   Diabetic  <7.0                   Glycemic control for                       adults with diabetes.    10/31/2023 04:40 AM 7.1 (H) 4.8 - 5.6 % Final    Comment:    (NOTE) Pre diabetes:          5.7%-6.4%  Diabetes:              >6.4%  Glycemic control for   <7.0% adults with diabetes    CBG: Recent Labs  Lab 08/22/24 1537 08/22/24 1955 08/22/24 2347 09/03/2024 0323 08/19/2024 0733  GLUCAP 160* 221* 163* 132* 203*    Review of Systems:    Patient on mechanical ventilation    Past Medical History  He,  has a past medical history of Anemia, Arthritis, Atrioventricular canal (AVC), Barrett esophagus, Bronchiolitis, Cancer (HCC) (2002), Chronic diastolic CHF (congestive heart  failure) (HCC), Colon polyp, Diabetes mellitus without complication (HCC), Diverticulosis, Gout, Heart disease, Hemangioma, Hyperlipidemia, Hypertension, Myocardial infarct (HCC), Ocular hypertension, Peripheral vascular disease (HCC), Skin cancer, 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HCC), Sleep apnea, and Vitreoretinal degeneration.   Surgical History    Past Surgical History:  Procedure Laterality Date   CATARACT EXTRACTION  2011, 2012   COLONOSCOPY N/A 12/14/2018   Procedure: COLONOSCOPY;  Surgeon:  Janalyn Keene NOVAK, MD;  Location: [facility] ENDOSCOPY;  Service: Endoscopy;  Laterality: N/A;   CORONARY ANGIOPLASTY WITH STENT PLACEMENT  12/10/2010   CORONARY STENT INTERVENTION N/A 12/29/2019   Procedure: CORONARY STENT INTERVENTION;  Surgeon: Mady Bruckner, MD;  Location: [facility] INVASIVE CV LAB;  Service: Cardiovascular;  Laterality: N/A;   ESOPHAGOGASTRODUODENOSCOPY N/A 12/14/2018   Procedure: ESOPHAGOGASTRODUODENOSCOPY (EGD);  Surgeon: Janalyn Keene NOVAK, MD;  Location: [facility] ENDOSCOPY;  Service: Endoscopy;  Laterality: N/A;   ESOPHAGOGASTRODUODENOSCOPY N/A 07/09/2024   Procedure: EGD (ESOPHAGOGASTRODUODENOSCOPY);  Surgeon: Onita Elspeth Sharper, DO;  Location[facility] ENDOSCOPY;  Service: Gastroenterology;  Laterality: N/A;  DM   ESOPHAGOGASTRODUODENOSCOPY (EGD) WITH PROPOFOL  N/A 06/01/2016   Procedure: ESOPHAGOGASTRODUODENOSCOPY (EGD) WITH PROPOFOL ;  Surgeon: Lamar ONEIDA Holmes, MD;  Location[facility] ENDOSCOPY;  Service: Endoscopy;  Laterality: N/A;   ESOPHAGOGASTRODUODENOSCOPY (EGD) WITH PROPOFOL  N/A 01/31/2023   Procedure: ESOPHAGOGASTRODUODENOSCOPY (EGD) WITH PROPOFOL ;  Surgeon: Jinny Carmine, MD;  Location: [facility] ENDOSCOPY;  Service: Endoscopy;  Laterality: N/A;   FEMORAL ARTERY STENT Right    HERNIA REPAIR     x2   INTRAOCULAR LENS INSERTION     LEFT HEART CATH AND CORONARY ANGIOGRAPHY N/A 05/09/2017   Procedure: Left Heart Cath and Coronary Angiography;  Surgeon: Florencio Cara BIRCH, MD;  Location: [facility] INVASIVE  CV LAB;  Service: Cardiovascular;  Laterality: N/A;   LEFT HEART CATH AND CORONARY ANGIOGRAPHY N/A 12/08/2018   Procedure: LEFT HEART CATH AND CORONARY ANGIOGRAPHY and possible PCI and stent;  Surgeon: Florencio Cara BIRCH, MD;  Location: [facility] INVASIVE CV LAB;  Service: Cardiovascular;  Laterality: N/A;   LEFT HEART CATH AND CORONARY ANGIOGRAPHY N/A 12/29/2019   Procedure: LEFT HEART CATH AND CORONARY ANGIOGRAPHY;  Surgeon: Mady Bruckner, MD;  Location: [facility] INVASIVE CV LAB;  Service: Cardiovascular;  Laterality: N/A;   LEFT HEART CATH AND CORONARY ANGIOGRAPHY Left 12/13/2022   Procedure: LEFT HEART CATH AND CORONARY ANGIOGRAPHY;  Surgeon: Florencio Cara BIRCH, MD;  Location: [facility] INVASIVE CV LAB;  Service: Cardiovascular;  Laterality: Left;   NOSE SURGERY     submucous resection   PROSTATE SURGERY  12/10/2000   ROTATOR CUFF REPAIR Right     Social History   reports that he quit smoking about 47 years ago. His smoking use included cigarettes. He started smoking about 67 years ago. He has never used smokeless tobacco. He reports that he does not drink alcohol and does not use drugs.   Family History   His family history includes Arthritis in his mother; Stroke in his maternal grandfather. There is no history of Breast cancer.   Allergies Allergies  Allergen Reactions   Gabapentin      Other reaction(s): Other (See Comments) Tremors   Peanut-Containing Drug Products Anaphylaxis   Penicillins Hives and Rash    Per notes - patient tolerated cefdinir in 2022    Bee Venom Swelling   Influenza Vaccines Hives   Inh [Isoniazid ] Hives   Kenalog [Triamcinolone Acetonide] Hives   Levaquin  [Levofloxacin ] Other (See Comments)    Tendon, ligament pain.    Lisinopril      Other reaction(s): Hyperkalemia   Nalfon [Fenoprofen Calcium ] Hives   Naproxen    Peanut Oil    Nsaids Rash    Nalfon 600 Nalfon 600   Home Medications  Prior to Admission medications   Medication Sig Start Date End Date  Taking? Authorizing Provider  acetaminophen  (TYLENOL ) 500 MG tablet Take 1,000 mg by mouth every 8 (eight) hours as needed for mild pain.    [provider]  apixaban (ELIQUIS) 2.5 MG TABS tablet Take 2.5 mg by mouth. 07/27/24   [provider]  atorvastatin  (LIPITOR ) 80 MG tablet Take 1 tablet (80 mg total) by mouth daily. 04/25/23   Laurita Pillion, MD  azelastine  (ASTELIN ) 0.1 % nasal spray USE 1 TO 2 SPRAYS IN EACH NOSTRIL TWICE A DAY 06/01/20   [provider]  calcitRIOL  (ROCALTROL ) 0.25 MCG capsule Take 0.25 mcg by mouth daily.    [provider]  citalopram  (CELEXA ) 10 MG tablet Take 1 tablet (10 mg total) by mouth daily. 03/12/22   Britta King, MD  clopidogrel  (PLAVIX ) 75 MG tablet TAKE 1 TABLET BY MOUTH EVERY DAY Patient not taking: Reported on 07/30/2024 07/02/22   Britta King, MD  colchicine  0.6 MG tablet Take 1 tablet (0.6 mg total) by mouth 2 (two) times daily as needed. Patient not taking: Reported on 07/30/2024 07/05/24   Amin, Sumayya, MD  [employer] VITAMIN B12 1000 MCG tablet TAKE 1 TABLET BY MOUTH EVERY DAY 11/21/21   Babara Call, MD  diltiazem  (CARDIZEM ) 30 MG tablet Take 1 tablet (30 mg total) by mouth 2 (two) times daily. Do not take this medication if SBP less than 110 and/or HR less than 60 07/05/24 08/04/24  Caleen Qualia, MD  docusate sodium  (COLACE) 100 MG capsule Take 100 mg by mouth 2 (two) times daily. Patient taking differently: Take 200 mg by mouth daily.    [provider]  EPINEPHrine  (EPI-PEN) 0.3 mg/0.3 mL SOAJ injection Inject into the muscle as needed (anaphylaxis).    [provider]  ergocalciferol  (VITAMIN D2) 1.25 MG (50000 UT) capsule Take 50,000 Units by mouth every 30 (thirty) days.    [provider]  feeding supplement (ENSURE PLUS HIGH PROTEIN) LIQD Take 237 mLs by mouth 3 (three) times daily between meals. 07/05/24   Caleen Qualia, MD  ferrous sulfate  325 (65 FE) MG EC tablet TAKE 1 TABLET BY MOUTH EVERY DAY  02/06/21   Babara Call, MD  fluticasone  (FLONASE ) 50 MCG/ACT nasal spray USE 1 SPRAY IN EACH NOSTRIL EVERY DAY 09/12/20   Britta King, MD  hydrALAZINE  (APRESOLINE ) 50 MG tablet Take 25 mg by mouth 2 (two) times daily. 10/14/23   [provider]  insulin  aspart (NOVOLOG ) 100 UNIT/ML FlexPen Inject 10-30 Units into the skin 3 (three) times daily with meals. Take 10 units before breakfast, 30 units before lunch and 30 units before supper. 04/25/20   [provider]  insulin  glargine (LANTUS ) 100 UNIT/ML injection Inject 0.35 mLs (35 Units total) into the skin at bedtime. This is a decrease from your previous 45 units nightly. Patient taking differently: Inject 60 Units into the skin at bedtime. 12/30/19   Awanda City, MD  isosorbide  mononitrate (IMDUR ) 120 MG 24 hr tablet Take 1 tablet (120 mg total) by mouth 2 (two) times daily. 11/03/23   Alexander, Natalie, DO  JARDIANCE 10 MG TABS tablet Take 10 mg by mouth daily. 02/19/22   [provider]  lidocaine  (XYLOCAINE ) 2 % solution Use as directed 15 mLs in the mouth or throat every 6 (six) hours as needed (throat/swallowing pain). 07/05/24   Caleen Qualia, MD  loratadine  (CLARITIN ) 10 MG tablet Take 10 mg by mouth daily.    [provider]  magnesium  oxide (MAG-OX) 400 (240 Mg) MG tablet Take 1 tablet by mouth daily. 04/25/22   [provider]  methocarbamol  (ROBAXIN ) 500 MG tablet Take 500 mg by mouth 2 (two) times daily. 11/08/18  [provider]  metolazone  (ZAROXOLYN ) 2.5 MG tablet Take 2.5 mg by mouth once a week. 06/04/24 06/04/25  [provider]  metoprolol  succinate (TOPROL -XL) 100 MG 24 hr tablet Take 1 tablet (100 mg total) by mouth daily. Take with or immediately following a meal. 04/28/24 07/30/24  Trudy Anthony HERO, MD  mometasone  (ASMANEX ) 220 MCG/ACT inhaler Inhale 2 puffs into the lungs daily. 05/30/22   [provider]  montelukast  (SINGULAIR ) 10 MG tablet TAKE 1 TABLET AT  BEDTIME 06/26/24   McDonough, Lauren K, PA-C  nitroGLYCERIN  (NITROSTAT ) 0.4 MG SL tablet Place 0.4 mg under the tongue every 5 (five) minutes x 3 doses as needed for chest pain.    [provider]  pantoprazole  (PROTONIX ) 40 MG tablet Take 1 tablet (40 mg total) by mouth 2 (two) times daily before a meal. 12/17/19   Amin, Ankit C, MD  ranolazine  (RANEXA ) 500 MG 12 hr tablet Take 1 tablet by mouth 2 (two) times daily.    [provider]  Tiotropium Bromide-Olodaterol (STIOLTO RESPIMAT) 2.5-2.5 MCG/ACT AERS Inhale 1 each into the lungs daily.    [provider]  torsemide  (DEMADEX ) 20 MG tablet Take 40 mg by mouth 2 (two) times daily.    [provider]  Scheduled Meds:  Chlorhexidine  Gluconate Cloth  6 each Topical Daily   docusate  100 mg Per Tube BID   etomidate        etomidate   20 mg Intravenous Once   insulin  aspart  0-15 Units Subcutaneous Q4H   ipratropium-albuterol   3 mL Nebulization TID   midazolam   4 mg Intravenous Once   mouth rinse  15 mL Mouth Rinse Q2H   pantoprazole  (PROTONIX ) IV  40 mg Intravenous Q12H   polyethylene glycol  17 g Per Tube Daily   rocuronium   100 mg Intravenous Once   tranexamic acid   500 mg Nebulization Q8H   Continuous Infusions:  sodium chloride      amiodarone  60 mg/hr (08/30/2024 0932)   Followed by   amiodarone      amiodarone      azithromycin  250 mL/hr at 08/16/2024 0700   cefTRIAXone  (ROCEPHIN )  IV Stopped (09/02/2024 9356)   fentaNYL      fentaNYL  infusion INTRAVENOUS 100 mcg/hr (08/24/2024 0752)   norepinephrine  (LEVOPHED ) Adult infusion 5 mcg/min (09/06/2024 0901)   PRN Meds:.amiodarone , docusate sodium , etomidate , fentaNYL , ipratropium-albuterol , midazolam , morphine  injection, ondansetron  (ZOFRAN ) IV, mouth rinse, mouth rinse, polyethylene glycol  Active Hospital Problem list   See systems below  Assessment & Plan:  #1 Acute Hypoxic Respiratory Failure with hemoptysis               S/p bronchoscopy with oozing from  RML medial segment -suspect medication adverse effects,  hold plavix /heparin , give nebulized txA.  Patient 100% fiO2 on PRVC          - BAL in process , currently on rocephin  zithromax  empirically -Supplemental O2 as needed to maintain O2 saturations 88 to 92% -Follow cultures, respiratory panel, strep pneumo and Legionella antigen -monitor cbc, fever curve  2. NSTEMI - patient with severe CAD s/p PCI AF - currently on 100% fiO2 due to hemoptysis.  S/p cardiology evaluation - patient well known to cardiology with numerous procedures and cardiac cath over past 10 years.  Dr Florencio was able to see patient and initiated amio gtt but recommends no AC/ADP inhibitors and No recommendation for LHC at this time.  I met with wife and reviewed plan.   3. AF/AFL - continue amiodarone   4. CKD4 - judicious use of nephrotoxins please  5. Chronic OSA  6. DM2 - Iss AC/HS 7. GERD with Barrets esophagus - protonix  IV 40 daily      #Carotid Stenosis -Follows with vascular plan for right carotid endarterectomy 9/15 -Consult vascular as appropriate  #Type II Diabetes Mellitus  -Check A1c -CBG's q4hrs  -SSI -Hold basal while NPO -Target CBG readings 140 to 180 -Follow hypo/hyperglycemic protocol   Best practice:  Diet:  NPO Pain/Anxiety/Delirium protocol (if indicated): No VAP protocol (if indicated): Not indicated DVT prophylaxis: Contraindicated GI prophylaxis: PPI Glucose control:  SSI Yes Central venous access:  N/A Arterial line:  N/A Foley:  N/A Mobility:  bed rest  PT consulted: N/A Last date of multidisciplinary goals of care discussion []  Code Status:  full code Disposition: ICU   = Goals of Care = Code Status Order:FULL  Primary Emergency Contact: Nahar,Anne, Home Phone: [phone]   Critical care provider statement:   Total critical care time: 93 minutes   Performed by: Parris MD   Critical care time was exclusive of separately billable procedures and treating  other patients.   Critical care was necessary to treat or prevent imminent or life-threatening deterioration.   Critical care was time spent personally by me on the following activities: development of treatment plan with patient and/or surrogate as well as nursing, discussions with consultants, evaluation of patient's response to treatment, examination of patient, obtaining history from patient or surrogate, ordering and performing treatments and interventions, ordering and review of laboratory studies, ordering and review of radiographic studies, pulse oximetry and re-evaluation of patient's condition.    Hal Norrington, M.D.  Pulmonary & Critical Care Medicine

## 2024-09-09 NOTE — Consult Note (Signed)
 [facility] CLINIC CARDIOLOGY CONSULT NOTE       Patient ID: Mike Decker MRN: [id number] DOB/AGE: 84/13/1942 84 y.o.  Admit date: 08/22/2024 Referring Physician Dr. Parris Primary Physician Liana Fish, NP  Primary Cardiologist Dr. Florencio Reason for Consultation atrial flutter RVR, elevated troponin, chest pain  HPI: Mike Decker is a 83 y.o. male  with a past medical history of chronic angina, microvascular disease, CAD s/p PCI w/ DES RCA, prox LCx, jailed OM 2 but good flow, HFpEF (EF 55-60% 08/2022), mild to moderate AS (mean grad 19 mmHg), CKD 4, type 2 diabetes, carotid stenosis, COPD, OSA on CPAP, Barrett's esophagus, history of prostate cancer who presented to the ED on 08/22/2024 for shortness of breath, hemoptysis.  He was admitted to the ICU on 08/22/2024.  On the morning of 09/08/2024 he developed chest pain with an ischemic EKG and troponin was checked and was found to be elevated at 4000.  Also noted to be in atrial a flutter RVR.  Cardiology was consulted for further evaluation.   Patient initially came to the ED for worsening shortness of breath and cough which was noted to be pink at first but then frank blood on the day of admission.  Workup in the ED notable for creatinine 2.88, potassium 4.2, hemoglobin 10.3, WBC 11.2.  Troponins initially were 53 > 70 > 132 > 148 > 139. BNP initially was 344, up to 749 early this a.m. EKG in the ED sinus rhythm rate 67 bpm, no acute ischemic changes.  Early this a.m. he developed chest pain, EKG at that time revealed sinus tachycardia with diffuse ST depression.  Ultimately he was intubated this morning at 730 for airway protection.  Troponins checked today and are elevated at 4487 and trended to 3942.  Around 930 this morning shortly after my evaluation of the patient he was noted to have morphology change of his heart rhythm concerning for slow VT with rate in the 110s, prior to this he had been atrial flutter rate 130s to  140s.  Pulses were lost and thus he was coded for a short period and ROSC was obtained.  He was started on IV amiodarone .  Review of systems complete and found to be negative unless listed above    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, esphageal   Chronic diastolic CHF (congestive heart failure) (HCC)    Colon polyp    Diabetes mellitus without complication (HCC)    Diverticulosis    Gout    Heart disease    Hemangioma    liver   Hyperlipidemia    Hypertension    Myocardial infarct [facility])    Ocular hypertension    Peripheral vascular disease (HCC)    Skin cancer    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HCC)    Sleep apnea    Vitreoretinal degeneration     Past Surgical History:  Procedure Laterality Date   CATARACT EXTRACTION  2011, 2012   COLONOSCOPY N/A 12/14/2018   Procedure: COLONOSCOPY;  Surgeon: Janalyn Keene NOVAK, MD;  Location: [facility] ENDOSCOPY;  Service: Endoscopy;  Laterality: N/A;   CORONARY ANGIOPLASTY WITH STENT PLACEMENT  12/10/2010   CORONARY STENT INTERVENTION N/A 12/29/2019   Procedure: CORONARY STENT INTERVENTION;  Surgeon: Mady Bruckner, MD;  Location: [facility] INVASIVE CV LAB;  Service:  Cardiovascular;  Laterality: N/A;   ESOPHAGOGASTRODUODENOSCOPY N/A 12/14/2018   Procedure: ESOPHAGOGASTRODUODENOSCOPY (EGD);  Surgeon: Janalyn Keene NOVAK, MD;  Location: [facility] ENDOSCOPY;  Service: Endoscopy;  Laterality: N/A;   ESOPHAGOGASTRODUODENOSCOPY N/A 07/09/2024   Procedure: EGD (ESOPHAGOGASTRODUODENOSCOPY);  Surgeon: Onita Elspeth Sharper, DO;  Location: [facility] ENDOSCOPY;  Service: Gastroenterology;  Laterality: N/A;  DM   ESOPHAGOGASTRODUODENOSCOPY (EGD) WITH PROPOFOL  N/A 06/01/2016   Procedure: ESOPHAGOGASTRODUODENOSCOPY (EGD) WITH PROPOFOL ;  Surgeon: Lamar ONEIDA Holmes, MD;  Location: [facility] ENDOSCOPY;  Service: Endoscopy;  Laterality: N/A;    ESOPHAGOGASTRODUODENOSCOPY (EGD) WITH PROPOFOL  N/A 01/31/2023   Procedure: ESOPHAGOGASTRODUODENOSCOPY (EGD) WITH PROPOFOL ;  Surgeon: Jinny Carmine, MD;  Location: [facility] ENDOSCOPY;  Service: Endoscopy;  Laterality: N/A;   FEMORAL ARTERY STENT Right    HERNIA REPAIR     x2   INTRAOCULAR LENS INSERTION     LEFT HEART CATH AND CORONARY ANGIOGRAPHY N/A 05/09/2017   Procedure: Left Heart Cath and Coronary Angiography;  Surgeon: Florencio Cara BIRCH, MD;  Location: [facility] INVASIVE CV LAB;  Service: Cardiovascular;  Laterality: N/A;   LEFT HEART CATH AND CORONARY ANGIOGRAPHY N/A 12/08/2018   Procedure: LEFT HEART CATH AND CORONARY ANGIOGRAPHY and possible PCI and stent;  Surgeon: Florencio Cara BIRCH, MD;  Location: [facility] INVASIVE CV LAB;  Service: Cardiovascular;  Laterality: N/A;   LEFT HEART CATH AND CORONARY ANGIOGRAPHY N/A 12/29/2019   Procedure: LEFT HEART CATH AND CORONARY ANGIOGRAPHY;  Surgeon: Mady Bruckner, MD;  Location: [facility] INVASIVE CV LAB;  Service: Cardiovascular;  Laterality: N/A;   LEFT HEART CATH AND CORONARY ANGIOGRAPHY Left 12/13/2022   Procedure: LEFT HEART CATH AND CORONARY ANGIOGRAPHY;  Surgeon: Florencio Cara BIRCH, MD;  Location: [facility] INVASIVE CV LAB;  Service: Cardiovascular;  Laterality: Left;   NOSE SURGERY     submucous resection   PROSTATE SURGERY  12/10/2000   ROTATOR CUFF REPAIR Right     Medications Prior to Admission  Medication Sig Dispense Refill Last Dose/Taking   acetaminophen  (TYLENOL ) 500 MG tablet Take 1,000 mg by mouth every 8 (eight) hours as needed for mild pain.   Unknown   apixaban (ELIQUIS) 2.5 MG TABS tablet Take 2.5 mg by mouth 2 (two) times daily. Paused due to having procedure Monday 9/15   Unknown   atorvastatin  (LIPITOR ) 80 MG tablet Take 1 tablet (80 mg total) by mouth daily. 30 tablet 0 08/21/2024   azelastine  (ASTELIN ) 0.1 % nasal spray USE 1 TO 2 SPRAYS IN EACH NOSTRIL TWICE A DAY   08/21/2024   citalopram  (CELEXA ) 10 MG tablet Take 1 tablet (10 mg  total) by mouth daily. 90 tablet 3 08/21/2024   [employer] VITAMIN B12 1000 MCG tablet TAKE 1 TABLET BY MOUTH EVERY DAY 30 tablet 0 08/21/2024   diltiazem  (CARDIZEM ) 30 MG tablet Take 1 tablet (30 mg total) by mouth 2 (two) times daily. Do not take this medication if SBP less than 110 and/or HR less than 60 (Patient taking differently: Take 30 mg by mouth 3 (three) times daily. Do not take this medication if SBP less than 110 and/or HR less than 60) 60 tablet 0 08/21/2024   docusate sodium  (COLACE) 100 MG capsule Take 100 mg by mouth 2 (two) times daily. (Patient taking differently: Take 200 mg by mouth daily.)   08/21/2024   EPINEPHrine  (EPI-PEN) 0.3 mg/0.3 mL SOAJ injection Inject into the muscle as needed (anaphylaxis).   Unknown   ergocalciferol  (VITAMIN D2) 1.25 MG (50000 UT) capsule Take 50,000 Units by mouth every 30 (thirty) days.   08/01/2024  ferrous sulfate  325 (65 FE) MG EC tablet TAKE 1 TABLET BY MOUTH EVERY DAY 90 tablet 3 08/21/2024   fluticasone  (FLONASE ) 50 MCG/ACT nasal spray USE 1 SPRAY IN EACH NOSTRIL EVERY DAY 32 g 6 08/21/2024   hydrALAZINE  (APRESOLINE ) 50 MG tablet Take 25 mg by mouth 2 (two) times daily.   08/21/2024   insulin  aspart (NOVOLOG ) 100 UNIT/ML FlexPen Inject 10-30 Units into the skin 3 (three) times daily with meals. Take 10 units before breakfast, 30 units before lunch and 30 units before supper.   08/21/2024   insulin  glargine (LANTUS ) 100 UNIT/ML injection Inject 0.35 mLs (35 Units total) into the skin at bedtime. This is a decrease from your previous 45 units nightly. (Patient taking differently: Inject 60 Units into the skin at bedtime.) 10 mL 11 08/21/2024   isosorbide  mononitrate (IMDUR ) 120 MG 24 hr tablet Take 1 tablet (120 mg total) by mouth 2 (two) times daily. 60 tablet 0 08/21/2024   JARDIANCE 10 MG TABS tablet Take 10 mg by mouth daily.   08/21/2024   loratadine  (CLARITIN ) 10 MG tablet Take 10 mg by mouth daily.   08/21/2024   magnesium  oxide (MAG-OX) 400 (240 Mg) MG  tablet Take 1 tablet by mouth daily.   08/21/2024   methocarbamol  (ROBAXIN ) 500 MG tablet Take 500 mg by mouth 2 (two) times daily.   08/21/2024   [Paused] metolazone  (ZAROXOLYN ) 2.5 MG tablet Take 2.5 mg by mouth once a week.   08/15/2024   metoprolol  succinate (TOPROL -XL) 100 MG 24 hr tablet Take 1 tablet (100 mg total) by mouth daily. Take with or immediately following a meal. 30 tablet 0 08/21/2024   mometasone  (ASMANEX ) 220 MCG/ACT inhaler Inhale 2 puffs into the lungs daily.   08/21/2024   montelukast  (SINGULAIR ) 10 MG tablet TAKE 1 TABLET AT BEDTIME 90 tablet 3 08/21/2024   nitroGLYCERIN  (NITROSTAT ) 0.4 MG SL tablet Place 0.4 mg under the tongue every 5 (five) minutes x 3 doses as needed for chest pain.   Unknown   pantoprazole  (PROTONIX ) 40 MG tablet Take 1 tablet (40 mg total) by mouth 2 (two) times daily before a meal.   08/21/2024   ranolazine  (RANEXA ) 500 MG 12 hr tablet Take 1 tablet by mouth 2 (two) times daily.   08/21/2024   Tiotropium Bromide-Olodaterol (STIOLTO RESPIMAT) 2.5-2.5 MCG/ACT AERS Inhale 1 each into the lungs daily.   08/21/2024   [Paused] torsemide  (DEMADEX ) 20 MG tablet Take 40 mg by mouth 2 (two) times daily.   08/21/2024   calcitRIOL  (ROCALTROL ) 0.25 MCG capsule Take 0.25 mcg by mouth daily. (Patient not taking: Reported on 08/22/2024)   Not Taking   [Paused] clopidogrel  (PLAVIX ) 75 MG tablet TAKE 1 TABLET BY MOUTH EVERY DAY (Patient not taking: No sig reported) 90 tablet 1 Not Taking   colchicine  0.6 MG tablet Take 1 tablet (0.6 mg total) by mouth 2 (two) times daily as needed. (Patient not taking: Reported on 07/09/2024) 30 tablet 0 Not Taking   feeding supplement (ENSURE PLUS HIGH PROTEIN) LIQD Take 237 mLs by mouth 3 (three) times daily between meals. 20000 mL 2    lidocaine  (XYLOCAINE ) 2 % solution Use as directed 15 mLs in the mouth or throat every 6 (six) hours as needed (throat/swallowing pain). (Patient not taking: Reported on 08/22/2024) 200 mL 0 Not Taking   Social  History   Socioeconomic History   Marital status: Married    Spouse name: Not on file   Number of children: 2  Years of education: College 3 years   Highest education level: Associate degree: occupational, Scientist, product/process development, or vocational program  Occupational History   Occupation: retired  Tobacco Use   Smoking status: Former    Current packs/day: 0.00    Types: Cigarettes    Start date: 12/10/1956    Quit date: 12/10/1976    Years since quitting: 47.7   Smokeless tobacco: Never  Vaping Use   Vaping status: Never Used  Substance and Sexual Activity   Alcohol use: No   Drug use: No   Sexual activity: Not Currently  Other Topics Concern   Not on file  Social History Narrative   Not on file   Social Drivers of Health   Financial Resource Strain: Low Risk  (06/21/2023)   Received from [facility]   Overall Financial Resource Strain (CARDIA)    Difficulty of Paying Living Expenses: Not very hard  Food Insecurity: No Food Insecurity (08/22/2024)   Hunger Vital Sign    Worried About Running Out of Food in the Last Year: Never true    Ran Out of Food in the Last Year: Never true  Transportation Needs: No Transportation Needs (08/22/2024)   PRAPARE - Administrator, Civil Service (Medical): No    Lack of Transportation (Non-Medical): No  Physical Activity: Insufficiently Active (09/15/2021)   Exercise Vital Sign    Days of Exercise per Week: 7 days    Minutes of Exercise per Session: 20 min  Stress: No Stress Concern Present (09/15/2021)   [employer] of Occupational Health - Occupational Stress Questionnaire    Feeling of Stress : Not at all  Social Connections: Moderately Isolated (08/22/2024)   Social Connection and Isolation Panel    Frequency of Communication with Friends and Family: Once a week    Frequency of Social Gatherings with Friends and Family: More than three times a week    Attends Religious Services: Never    Database administrator or  Organizations: No    Attends Banker Meetings: Never    Marital Status: Married  Catering manager Violence: Not At Risk (08/22/2024)   Humiliation, Afraid, Rape, and Kick questionnaire    Fear of Current or Ex-Partner: No    Emotionally Abused: No    Physically Abused: No    Sexually Abused: No    Family History  Problem Relation Age of Onset   Arthritis Mother    Stroke Maternal Grandfather    Breast cancer Neg Hx      Vitals:   08/15/2024 0700 09/07/2024 0701 08/18/2024 0725 08/29/2024 0751  BP: (!) 81/44 (!) 85/53  (!) 150/78  Pulse: (!) 104 100  (!) 127  Resp: 18 (!) 22  16  Temp:      TempSrc:      SpO2: 90% 91% 93% 93%  Weight:      Height:        PHYSICAL EXAM General: Critically ill-appearing male, intubated, sedated. HEENT: Normocephalic and atraumatic. Neck: No JVD.  Lungs: Mechanical breath sounds Heart: Irregularly irregular. Normal S1 and S2 without gallops or murmurs.  Abdomen: Non-distended appearing.  Msk: Normal strength and tone for age. Extremities: Warm and well perfused. No clubbing, cyanosis.  No significant lower extremity edema.   Labs: Basic Metabolic Panel: Recent Labs    08/16/2024 0329 08/13/2024 0929  NA 138 137  K 4.0 4.2  CL 103 102  CO2 22 21*  GLUCOSE 146* 419*  BUN 63* 64*  CREATININE 2.91* 3.00*  CALCIUM  8.5* 7.9*  MG 2.4 2.5*  PHOS 3.4 6.2*   Liver Function Tests: Recent Labs    08/22/24 0156 09/03/2024 0929  AST 20 53*  ALT 14 18  ALKPHOS 48 48  BILITOT 0.9 0.9  PROT 6.4* 5.5*  ALBUMIN  3.6 2.9*   Recent Labs    08/21/24 0448 08/22/24 0156  LIPASE 55* 56*   CBC: Recent Labs    08/22/24 0156 08/22/24 0515 08/15/2024 0329 08/20/2024 0929  WBC 11.2*   < > 11.4* 19.2*  NEUTROABS 8.6*  --   --  15.0*  HGB 10.3*   < > 10.2* 10.3*  HCT 32.3*   < > 32.8* 33.2*  MCV 99.7   < > 100.6* 103.4*  PLT 243   < > 223 287   < > = values in this interval not displayed.   Cardiac Enzymes: Recent Labs     08/22/24 1524 08/16/2024 0329 08/11/2024 0929  TROPONINIHS 139* 4,487* 3,942*   BNP: Recent Labs    08/22/24 0156 08/28/2024 0329  BNP 344.3* 749.1*   D-Dimer: No results for input(s): DDIMER in the last 72 hours. Hemoglobin A1C: Recent Labs    08/22/24 0515  HGBA1C 6.6*   Fasting Lipid Panel: No results for input(s): CHOL, HDL, LDLCALC, TRIG, CHOLHDL, LDLDIRECT in the last 72 hours. Thyroid  Function Tests: No results for input(s): TSH, T4TOTAL, T3FREE, THYROIDAB in the last 72 hours.  Invalid input(s): FREET3 Anemia Panel: No results for input(s): VITAMINB12, FOLATE, FERRITIN, TIBC, IRON , RETICCTPCT in the last 72 hours.   Radiology: [facility] Chest Port 1 View Result Date: 09/03/2024 CLINICAL DATA:  83 year old male with respiratory illness. Respiratory failure. EXAM: PORTABLE CHEST 1 VIEW COMPARISON:  Portable chest yesterday and earlier. FINDINGS: Portable AP semi upright view at 0539 hours. Ongoing low and slightly lower lung volumes compared to yesterday. Stable cardiac size and mediastinal contours. Visualized tracheal air column is within normal limits. No pneumothorax or pleural effusion. Diffuse coarse bilateral pulmonary interstitial opacity, no improvement. And increasing confluent retrocardiac opacity. Paucity of bowel gas. No acute osseous abnormality identified. IMPRESSION: 1. Ongoing coarse diffuse interstitial opacity with lower lung volumes. 2. Increasingly confluent lung base opacity. Electronically Signed   By: VEAR Hurst M.D.   On: 08/10/2024 08:51   DG Abd 1 View Result Date: 08/15/2024 CLINICAL DATA:  83 year old male with respiratory illness. Respiratory failure. Intubated. Enteric tube placement. EXAM: ABDOMEN - 1 VIEW COMPARISON:  Portable chest x-ray reported the same time. CT Abdomen and Pelvis 12/16/2019. FINDINGS: Portable AP semi upright view at 0828 hours. Enteric tube placed into the stomach, side hole is at the level of the  distal gastric body. Partially confluent opacity at the visible left lung base. Increased bowel gas compared to 2021 CT but nonobstructed bowel gas pattern. No acute osseous abnormality identified. IMPRESSION: 1. Satisfactory enteric tube placement into the stomach. 2. Nonobstructed bowel-gas pattern. Electronically Signed   By: VEAR Hurst M.D.   On: 08/25/2024 08:50   DG Chest Port 1 View Result Date: 09/07/2024 CLINICAL DATA:  83 year old male with respiratory illness. Respiratory failure. Intubated. EXAM: PORTABLE CHEST 1 VIEW COMPARISON:  Portable chest 0539 hours today. FINDINGS: Portable AP semi upright view at 0823 hours. Endotracheal tube placed in good position between the level the clavicles and carina. Enteric tube courses to the left abdomen, tip not included. New left IJ central line in place, tip at the carina, SVC level. Mildly rotated to the left. Stable cardiac  size and mediastinal contours. Mildly improved lung volumes. Increased confluent left lung base opacity now obscuring the hemidiaphragm. Coarse bilateral pulmonary interstitial opacity otherwise stable. No pneumothorax or pleural effusion identified. Paucity of bowel gas. Stable visualized osseous structures. IMPRESSION: 1. Intubated with satisfactory visible lines and tubes. No pneumothorax. 2. Left lung base collapse or consolidation now. Otherwise stable diffuse, coarse pulmonary interstitial opacity. Electronically Signed   By: VEAR Hurst M.D.   On: 08/27/2024 08:49   DG Chest Port 1 View Result Date: 08/22/2024 CLINICAL DATA:  Acute respiratory failure. EXAM: PORTABLE CHEST 1 VIEW COMPARISON:  Chest radiograph dated 08/22/2024. FINDINGS: Diffuse chronic intra coarsening. No focal consolidation, pleural effusion or pneumothorax. Stable cardiac silhouette. No acute osseous pathology. IMPRESSION: No active disease. Electronically Signed   By: Vanetta Chou M.D.   On: 08/22/2024 16:18   CT Chest Wo Contrast Result Date:  08/22/2024 EXAM: CT CHEST WITHOUT CONTRAST 08/22/2024 03:26:47 AM TECHNIQUE: CT of the chest was performed without the administration of intravenous contrast. Multiplanar reformatted images are provided for review. Automated exposure control, iterative reconstruction, and/or weight based adjustment of the mA/kV was utilized to reduce the radiation dose to as low as reasonably achievable. COMPARISON: None available. CLINICAL HISTORY: Respiratory illness, nondiagnostic xray; suspect pulmonary hemorrhage (hemoptysis on Eliquis) but patient has severe CKD. Pt arrived via [facility] ([facility] from home c/o SOB, especially with exertion. Pt has consistent cough and hemoptysis. Pt has chest pain only with cough. Pt on Eliquis. FINDINGS: MEDIASTINUM: Extensive calcification of the aortic valve leaflets. Extensive multivessel coronary artery calcification. Cardiac size within normal limits. Hypoattenuation of the cardiac blood pool in keeping with at least mild anemia. No pericardial effusion. Enlarged moderate atherosclerotic calcification within the thoracic aorta. No aortic aneurysm. LYMPH NODES: No mediastinal, hilar or axillary lymphadenopathy. LUNGS AND PLEURA: Underlying subpleural pulmonary fibrotic change is again identified. There is developing smooth interlobular septal thickening best appreciated at the lung bases in keeping with trace interstitial pulmonary edema. There are development of asymmetric ground glass pulmonary infiltrates throughout the right lung which may reflect asymmetric pulmonary hemorrhage in this patient with clinical hemoptysis or developing pneumonic consolidation. Trace bilateral pleural effusions are present. No pneumothorax. No central obstructing lesion. Thickening of the peribronchovascular interstitium asymmetrically involving the right lung which is in keeping with probable airway inflammation. SOFT TISSUES/BONES: No acute abnormality of the bones or soft tissues. UPPER ABDOMEN: A  small hiatal hernia. IMPRESSION: 1. Asymmetric ground glass pulmonary infiltrates throughout the right lung, possibly representing asymmetric pulmonary hemorrhage or developing pneumonic consolidation. 2. Trace interstitial pulmonary edema and trace bilateral pleural effusions. 3. Underlying subpleural pulmonary fibrotic change. 4. Extensive calcification of the aortic valve leaflets and multivessel coronary arteries. Electronically signed by: Dorethia Molt MD 08/22/2024 03:53 AM EDT RP Workstation: [id number]   DG Chest Port 1 View Result Date: 08/22/2024 EXAM: 1 VIEW XRAY OF THE CHEST 08/22/2024 02:03:05 AM COMPARISON: 08/21/2024 CLINICAL HISTORY: Questionable sepsis - evaluate for abnormality. c/o SOB, especially with exertion. Pt has consistent cough and hemoptysis. Pt has chest pain only with cough. Pt on Eliquis. FINDINGS: LUNGS AND PLEURA: Increased right lung airspace opacities superimposed on chronic interstitial coarsening. This can be seen with developing pneumonic consolidation or asymmetric pulmonary hemorrhage. No pleural effusion. No pneumothorax. HEART AND MEDIASTINUM: No acute abnormality of the cardiac and mediastinal silhouettes. BONES AND SOFT TISSUES: No acute osseous abnormality. IMPRESSION: 1. Increased right lung airspace opacities superimposed on chronic interstitial coarsening, possibly representing developing pneumonic consolidation or asymmetric  pulmonary hemorrhage. Electronically signed by: Dorethia Molt MD 08/22/2024 02:09 AM EDT RP Workstation: [id number]   DG Chest 1 View Result Date: 08/21/2024 EXAM: 1 VIEW XRAY OF THE CHEST 08/21/2024 05:06:04 AM COMPARISON: 07/07/2024 CLINICAL HISTORY: Complaints of CP that started last night. Per EMS, pt has taken his supplied nitroglycerin  and was provided nitroglycerin  by EMS with a total of 6 nitros PTA. Rates the pain 8/10. FINDINGS: LUNGS AND PLEURA: Persistent chronic interstitial coarsening of right lung. No focal pulmonary opacity.  No pulmonary edema. No pleural effusion. No pneumothorax. HEART AND MEDIASTINUM: No acute abnormality of the cardiac and mediastinal silhouettes. BONES AND SOFT TISSUES: No acute osseous abnormality. IMPRESSION: 1. No acute process. 2. Persistent chronic interstitial coarsening of the right lung. Electronically signed by: Waddell Calk MD 08/21/2024 06:18 AM EDT RP Workstation: [id number]   VAS US  CAROTID Result Date: 07/31/2024 Carotid Arterial Duplex Study Patient Name:  Mike Decker  Date of Exam:   07/30/2024 Medical Rec #: [id number]             Accession #:    [id number] Date of Birth: 03-07-1941             Patient Gender: M Patient Age:   40 years Exam Location:  [facility] Procedure:      VAS US  CAROTID Referring Phys: ORVIN DARING --------------------------------------------------------------------------------  Indications:  CVA and Carotid artery disease. Risk Factors: Hypertension, coronary artery disease, prior CVA. Performing Technologist: Donnice Charnley RVT  Examination Guidelines: A complete evaluation includes B-mode imaging, spectral Doppler, color Doppler, and power Doppler as needed of all accessible portions of each vessel. Bilateral testing is considered an integral part of a complete examination. Limited examinations for reoccurring indications may be performed as noted.  Right Carotid Findings: +----------+--------+--------+--------+----------------------+--------+           PSV cm/sEDV cm/sStenosisPlaque Description    Comments +----------+--------+--------+--------+----------------------+--------+ CCA Prox  124     0                                              +----------+--------+--------+--------+----------------------+--------+ CCA Mid   78      10                                             +----------+--------+--------+--------+----------------------+--------+ CCA Distal71      0                                               +----------+--------+--------+--------+----------------------+--------+ ICA Prox  210     40      40-59%  calcific and irregular         +----------+--------+--------+--------+----------------------+--------+ ICA Mid   198     23                                             +----------+--------+--------+--------+----------------------+--------+ ICA Distal62      14                                             +----------+--------+--------+--------+----------------------+--------+  ECA       258     0       >50%    calcific and irregular         +----------+--------+--------+--------+----------------------+--------+ +----------+--------+-------+----------------+-------------------+           PSV cm/sEDV cmsDescribe        Arm Pressure (mmHG) +----------+--------+-------+----------------+-------------------+ [id number]            Multiphasic, WNL                    +----------+--------+-------+----------------+-------------------+ +---------+--------+--+--------+-+---------+ VertebralPSV cm/s50EDV cm/s9Antegrade +---------+--------+--+--------+-+---------+  Left Carotid Findings: +----------+--------+--------+--------+----------------------+--------+           PSV cm/sEDV cm/sStenosisPlaque Description    Comments +----------+--------+--------+--------+----------------------+--------+ CCA Prox  112     15                                             +----------+--------+--------+--------+----------------------+--------+ CCA Mid   73      0                                              +----------+--------+--------+--------+----------------------+--------+ CCA Distal80      13                                             +----------+--------+--------+--------+----------------------+--------+ ICA Prox  88      13      1-39%   irregular and calcific         +----------+--------+--------+--------+----------------------+--------+ ICA Mid   85       17                                             +----------+--------+--------+--------+----------------------+--------+ ICA Distal77      17                                             +----------+--------+--------+--------+----------------------+--------+ ECA       190     0       >50%    irregular and calcific         +----------+--------+--------+--------+----------------------+--------+ +----------+--------+--------+----------------+-------------------+           PSV cm/sEDV cm/sDescribe        Arm Pressure (mmHG) +----------+--------+--------+----------------+-------------------+ [id number]             Multiphasic, WNL                    +----------+--------+--------+----------------+-------------------+ +---------+--------+--+--------+--+---------+ VertebralPSV cm/s85EDV cm/s13Antegrade +---------+--------+--+--------+--+---------+   Summary: Right Carotid: Velocities in the right ICA are consistent with a 40-59%                stenosis. The ECA appears >50% stenosed. Left Carotid: Velocities in the left ICA are consistent with a 1-39% stenosis.               The ECA appears >50% stenosed. Vertebrals:  Bilateral vertebral arteries  demonstrate antegrade flow. Subclavians: Normal flow hemodynamics were seen in bilateral subclavian              arteries. *See table(s) above for measurements and observations.  Electronically signed by Cordella Shawl MD on 07/31/2024 at 8:14:12 AM.    Final     ECHO ordered  TELEMETRY reviewed by me 08/25/2024: Earlier this a.m. atrial flutter rate 130-140s, at the time my evaluation he was intermittently converting into slow VT  EKG reviewed by me: Atrial flutter rate 133 bpm  Data reviewed by me 08/27/2024: last 24h vitals tele labs imaging I/O ED provider note, admission H&P  Principal Problem:   Hemoptysis    ASSESSMENT AND PLAN:  Mike Decker is a 83 y.o. male  with a past medical history of chronic angina,  microvascular disease, CAD s/p PCI w/ DES RCA, prox LCx, jailed OM 2 but good flow, HFpEF (EF 55-60% 08/2022), mild to moderate AS (mean grad 19 mmHg), CKD 4, type 2 diabetes, carotid stenosis, COPD, OSA on CPAP, Barrett's esophagus, history of prostate cancer who presented to the ED on 08/22/2024 for shortness of breath, hemoptysis.  He was admitted to the ICU on 08/22/2024.  On the morning of 08/31/2024 he developed chest pain with an ischemic EKG and troponin was checked and was found to be elevated at 4000.  Also noted to be in atrial a flutter RVR.  Cardiology was consulted for further evaluation.   # Cardiac arrest - VT # Atrial flutter RVR # Coronary artery disease with chronic angina # Acute on chronic HFpEF # Hemoptysis, pulmonary hemorrhage # Chronic kidney disease stage IV Patient initially presented with complaints of shortness of breath and hemoptysis, BNP found to be elevated at 749 and he was admitted to the ICU for further evaluation.  They were initially planning for bronchoscopy tomorrow.  Earlier this morning the patient developed intermittent episodes of VT and during this was found to be pulseless and thus received short round of resuscitation which was successful.  He was started on IV amiodarone .  Bronchoscopy this morning with right middle lobe medial segment active bleeding. - Continue IV amiodarone . - Continue to trend troponins.  Echo ordered for updated evaluation of heart function. -IV heparin  currently held given results of bronchoscopy. -Discussed with PCCM team that taking him to the Cath Lab for evaluation of non-STEMI is not ideal at this time given his hemoptysis and evidence of active bleeding on bronchoscopy today, as well as his chronic kidney disease, as he would need to be heparinized for possible intervention.  They are in agreement. - Titrate Levophed  as needed.  Further management per PCCM.   TIMI Risk Score for Unstable Angina or Non-ST Elevation MI:   The  patient's TIMI risk score is 6, which indicates a 41% risk of all cause mortality, new or recurrent myocardial infarction or need for urgent revascularization in the next 14 days.   This patient's plan of care was discussed and created with Dr. Florencio and he is in agreement.  Signed: Danita Bloch, PA-C  09/08/2024, 11:09 AM [facility]

## 2024-09-09 NOTE — H&P (Deleted)
 NAME:  Mike Decker, MRN:  [id number], DOB:  12-28-1940, LOS: 1 ADMISSION DATE:  08/22/2024, CONSULTATION DATE: 08/22/2024 REFERRING MD: Gordan Huxley, CHIEF COMPLAINT: Hemoptysis  HPI  83 y.o male with significant PMH of Barrett's esophagus, Esophageal stricture s/p dilation 2024, CAD s/p PCI w/DES RCA, prox LCx, HFpEF, Atrial Fibrillation on Eliquis, CKD stage IV, T2DM, carotid stenosis, COPD, OSA on CPAP and prostate cancer who presented to the ED with chief complaints of shortness of breath and hemoptysis.  On review of chart, patient was seen in the ED yesterday 9/12 for ongoing central chest pain with radiation to the left shoulder.  Workup was unrevealing, he was therefore treated with nitroglycerin , GI cocktail and discharged home with instructions to return to the ED with worsening symptoms.  Patient presented to the ED with shortness of breath that woke him up around 11:30 PM.  He then developed productive cough which was initially pink then he later noticed that he was frank blood.  Patient was concerned given his recent weight gain of about 8 pounds that he was in heart failure he therefore called EMS.   ED Course: Initial vital signs showed HR of 65 beats/minute, BP 116/44 mm Hg, the RR 13 breaths/minute, and the oxygen saturation 94% on RA and a temperature of 98.32F (36.9C). Pertinent Labs/Diagnostics Findings: Glucose: 206 BUN/Cr.:  70/2.88 WBC: 11.2 K/L Hgb/Hct: 10.3/32.3 COVID PCR: Negative,  troponin: 53 BNP: 344.3 CTA Chest>findings concerning for asymmetric pulmonary hemorrhage or developing mnemonic consolidation.  Patient received 1 g TXA IV and admitted to [facility] service  Past Medical History  Barrett's esophagus, Esophageal stricture s/p dilation 2024, CAD s/p PCI w/DES RCA, prox LCx, HFpEF, Atrial Fibrillation on Eliquis, CKD stage IV, T2DM, carotid stenosis, COPD, OSA on CPAP and prostate cancer  Significant Hospital Events   9/13: Admitted with acute  hypoxic respiratory failure ISO suspected pulmonary hemorrhage versus developing pneumonia  Consults:  Cardiology Vascular   Micro Data:  9/13: SARS-CoV-2 PCR> negative 9/13: Influenza PCR> negative 9/13: Blood culture x2> 9/13: Respiratory viral panel > 9/13: MRSA PCR>>  9/13: Strep pneumo urinary antigen> 9/13: Legionella urinary antigen>  Antimicrobials:  None  OBJECTIVE  Blood pressure (!) 150/78, pulse (!) 127, temperature 98 F (36.7 C), temperature source Oral, resp. rate 16, height 5' 6 (1.676 m), weight 86.5 kg, SpO2 93%.    Vent Mode: PRVC FiO2 (%):  [100 %] 100 % Set Rate:  [16 bmp] 16 bmp Vt Set:  [500 mL] 500 mL PEEP:  [5 cmH20] 5 cmH20   Intake/Output Summary (Last 24 hours) at 08/12/2024 1006 Last data filed at 09/08/2024 0700 Gross per 24 hour  Intake 983.03 ml  Output 1300 ml  Net -316.97 ml   Filed Weights   08/22/24 0150 08/22/24 0600 08/10/2024 0500  Weight: 88.4 kg 82.7 kg 86.5 kg   Physical Examination  GEN: Critically ill patient, WDWN in NAD HEENT: [place]/AT. PERRL, sclerae anicteric. HEART: Irregular rhythm, normal rate, S1, S2, no M/R/G,  LUNGS: CTAB, mild wheezes, no increased WOB,  EXTREMITIES: No Edema, cap refill  NEURO: No gross focal deficits. PSYCH:  Mood and Affect: Mood normal.  ABDOMINAL: Soft: BS x 4, NTND SKIN: Intact, warm, no rashes lesion, or ulcer  Labs/imaging that I havepersonally reviewed  (right click and Reselect all SmartList Selections daily)    DG Chest Port 1 View Result Date: 08/27/2024 CLINICAL DATA:  83 year old male with respiratory illness. Respiratory failure. EXAM: PORTABLE CHEST 1 VIEW COMPARISON:  Portable  chest yesterday and earlier. FINDINGS: Portable AP semi upright view at 0539 hours. Ongoing low and slightly lower lung volumes compared to yesterday. Stable cardiac size and mediastinal contours. Visualized tracheal air column is within normal limits. No pneumothorax or pleural effusion. Diffuse coarse  bilateral pulmonary interstitial opacity, no improvement. And increasing confluent retrocardiac opacity. Paucity of bowel gas. No acute osseous abnormality identified. IMPRESSION: 1. Ongoing coarse diffuse interstitial opacity with lower lung volumes. 2. Increasingly confluent lung base opacity. Electronically Signed   By: VEAR Hurst M.D.   On: 08/26/2024 08:51   DG Abd 1 View Result Date: 08/18/2024 CLINICAL DATA:  83 year old male with respiratory illness. Respiratory failure. Intubated. Enteric tube placement. EXAM: ABDOMEN - 1 VIEW COMPARISON:  Portable chest x-ray reported the same time. CT Abdomen and Pelvis 12/16/2019. FINDINGS: Portable AP semi upright view at 0828 hours. Enteric tube placed into the stomach, side hole is at the level of the distal gastric body. Partially confluent opacity at the visible left lung base. Increased bowel gas compared to 2021 CT but nonobstructed bowel gas pattern. No acute osseous abnormality identified. IMPRESSION: 1. Satisfactory enteric tube placement into the stomach. 2. Nonobstructed bowel-gas pattern. Electronically Signed   By: VEAR Hurst M.D.   On: 09/02/2024 08:50   DG Chest Port 1 View Result Date: 09/05/2024 CLINICAL DATA:  83 year old male with respiratory illness. Respiratory failure. Intubated. EXAM: PORTABLE CHEST 1 VIEW COMPARISON:  Portable chest 0539 hours today. FINDINGS: Portable AP semi upright view at 0823 hours. Endotracheal tube placed in good position between the level the clavicles and carina. Enteric tube courses to the left abdomen, tip not included. New left IJ central line in place, tip at the carina, SVC level. Mildly rotated to the left. Stable cardiac size and mediastinal contours. Mildly improved lung volumes. Increased confluent left lung base opacity now obscuring the hemidiaphragm. Coarse bilateral pulmonary interstitial opacity otherwise stable. No pneumothorax or pleural effusion identified. Paucity of bowel gas. Stable visualized osseous  structures. IMPRESSION: 1. Intubated with satisfactory visible lines and tubes. No pneumothorax. 2. Left lung base collapse or consolidation now. Otherwise stable diffuse, coarse pulmonary interstitial opacity. Electronically Signed   By: VEAR Hurst M.D.   On: 08/31/2024 08:49   DG Chest Port 1 View Result Date: 08/22/2024 CLINICAL DATA:  Acute respiratory failure. EXAM: PORTABLE CHEST 1 VIEW COMPARISON:  Chest radiograph dated 08/22/2024. FINDINGS: Diffuse chronic intra coarsening. No focal consolidation, pleural effusion or pneumothorax. Stable cardiac silhouette. No acute osseous pathology. IMPRESSION: No active disease. Electronically Signed   By: Vanetta Chou M.D.   On: 08/22/2024 16:18   CT Chest Wo Contrast Result Date: 08/22/2024 EXAM: CT CHEST WITHOUT CONTRAST 08/22/2024 03:26:47 AM TECHNIQUE: CT of the chest was performed without the administration of intravenous contrast. Multiplanar reformatted images are provided for review. Automated exposure control, iterative reconstruction, and/or weight based adjustment of the mA/kV was utilized to reduce the radiation dose to as low as reasonably achievable. COMPARISON: None available. CLINICAL HISTORY: Respiratory illness, nondiagnostic xray; suspect pulmonary hemorrhage (hemoptysis on Eliquis) but patient has severe CKD. Pt arrived via [facility] ([facility]) from home c/o SOB, especially with exertion. Pt has consistent cough and hemoptysis. Pt has chest pain only with cough. Pt on Eliquis. FINDINGS: MEDIASTINUM: Extensive calcification of the aortic valve leaflets. Extensive multivessel coronary artery calcification. Cardiac size within normal limits. Hypoattenuation of the cardiac blood pool in keeping with at least mild anemia. No pericardial effusion. Enlarged moderate atherosclerotic calcification within the thoracic  aorta. No aortic aneurysm. LYMPH NODES: No mediastinal, hilar or axillary lymphadenopathy. LUNGS AND PLEURA: Underlying subpleural  pulmonary fibrotic change is again identified. There is developing smooth interlobular septal thickening best appreciated at the lung bases in keeping with trace interstitial pulmonary edema. There are development of asymmetric ground glass pulmonary infiltrates throughout the right lung which may reflect asymmetric pulmonary hemorrhage in this patient with clinical hemoptysis or developing pneumonic consolidation. Trace bilateral pleural effusions are present. No pneumothorax. No central obstructing lesion. Thickening of the peribronchovascular interstitium asymmetrically involving the right lung which is in keeping with probable airway inflammation. SOFT TISSUES/BONES: No acute abnormality of the bones or soft tissues. UPPER ABDOMEN: A small hiatal hernia. IMPRESSION: 1. Asymmetric ground glass pulmonary infiltrates throughout the right lung, possibly representing asymmetric pulmonary hemorrhage or developing pneumonic consolidation. 2. Trace interstitial pulmonary edema and trace bilateral pleural effusions. 3. Underlying subpleural pulmonary fibrotic change. 4. Extensive calcification of the aortic valve leaflets and multivessel coronary arteries. Electronically signed by: Dorethia Molt MD 08/22/2024 03:53 AM EDT RP Workstation: [id number]   DG Chest Port 1 View Result Date: 08/22/2024 EXAM: 1 VIEW XRAY OF THE CHEST 08/22/2024 02:03:05 AM COMPARISON: 08/21/2024 CLINICAL HISTORY: Questionable sepsis - evaluate for abnormality. c/o SOB, especially with exertion. Pt has consistent cough and hemoptysis. Pt has chest pain only with cough. Pt on Eliquis. FINDINGS: LUNGS AND PLEURA: Increased right lung airspace opacities superimposed on chronic interstitial coarsening. This can be seen with developing pneumonic consolidation or asymmetric pulmonary hemorrhage. No pleural effusion. No pneumothorax. HEART AND MEDIASTINUM: No acute abnormality of the cardiac and mediastinal silhouettes. BONES AND SOFT TISSUES: No  acute osseous abnormality. IMPRESSION: 1. Increased right lung airspace opacities superimposed on chronic interstitial coarsening, possibly representing developing pneumonic consolidation or asymmetric pulmonary hemorrhage. Electronically signed by: Dorethia Molt MD 08/22/2024 02:09 AM EDT RP Workstation: [id number]    Labs   CBC: Recent Labs  Lab 08/21/24 0448 08/22/24 0156 08/22/24 0515 08/12/2024 0329 08/13/2024 0929  WBC 10.4 11.2* 10.5 11.4* 19.2*  NEUTROABS 7.7 8.6*  --   --  15.0*  HGB 10.7* 10.3* 9.9* 10.2* 10.3*  HCT 32.1* 32.3* 30.4* 32.8* 33.2*  MCV 98.5 99.7 98.4 100.6* 103.4*  PLT 236 243 219 223 287   Basic Metabolic Panel: Recent Labs  Lab 08/21/24 0448 08/22/24 0156 08/22/24 0515 08/20/2024 0329  NA 139 138 139 138  K 3.5 4.2 4.5 4.0  CL 103 103 106 103  CO2 24 22 24 22   GLUCOSE 211* 206* 213* 146*  BUN 71* 70* 73* 63*  CREATININE 2.95* 2.88* 2.98* 2.91*  CALCIUM  8.4* 8.4* 8.2* 8.5*  MG  --   --  2.2 2.4  PHOS  --   --  3.3 3.4   GFR: Estimated Creatinine Clearance: 19.5 mL/min (A) (by C-G formula based on SCr of 2.91 mg/dL (H)). Recent Labs  Lab 08/22/24 0156 08/22/24 0515 08/15/2024 0329 08/27/2024 0929  PROCALCITON  --  0.11  --   --   WBC 11.2* 10.5 11.4* 19.2*  LATICACIDVEN 1.7  --   --   --    Liver Function Tests: Recent Labs  Lab 08/21/24 0448 08/22/24 0156  AST 18 20  ALT 13 14  ALKPHOS 50 48  BILITOT 0.8 0.9  PROT 6.4* 6.4*  ALBUMIN  3.6 3.6   Recent Labs  Lab 08/21/24 0448 08/22/24 0156  LIPASE 55* 56*   No results for input(s): AMMONIA in the last 168 hours.  ABG    Component  Value Date/Time   PHART 7.11 (LL) 08/20/2024 0924   PCO2ART 71 (HH) 08/30/2024 0924   PO2ART 59 (L) 08/18/2024 0924   HCO3 22.6 08/12/2024 0924   ACIDBASEDEF 8.2 (H) 08/17/2024 0924   O2SAT 85.3 09/06/2024 0924   Coagulation Profile: Recent Labs  Lab 08/22/24 0156  INR 1.0   Cardiac Enzymes: No results for input(s): CKTOTAL, CKMB,  CKMBINDEX, TROPONINI in the last 168 hours.  HbA1C: Hemoglobin A1C  Date/Time Value Ref Range Status  01/22/2024 12:00 AM 7.6  Final  07/18/2021 12:00 AM 8.3  Final   Hgb A1c MFr Bld  Date/Time Value Ref Range Status  08/22/2024 05:15 AM 6.6 (H) 4.8 - 5.6 % Final    Comment:    (NOTE) Diagnosis of Diabetes The following HbA1c ranges recommended by the American Diabetes Association (ADA) may be used as an aid in the diagnosis of diabetes mellitus.  Hemoglobin             Suggested A1C NGSP%              Diagnosis  <5.7                   Non Diabetic  5.7-6.4                Pre-Diabetic  >6.4                   Diabetic  <7.0                   Glycemic control for                       adults with diabetes.    10/31/2023 04:40 AM 7.1 (H) 4.8 - 5.6 % Final    Comment:    (NOTE) Pre diabetes:          5.7%-6.4%  Diabetes:              >6.4%  Glycemic control for   <7.0% adults with diabetes    CBG: Recent Labs  Lab 08/22/24 1537 08/22/24 1955 08/22/24 2347 09/03/2024 0323 08/11/2024 0733  GLUCAP 160* 221* 163* 132* 203*    Review of Systems:    Patient on mechanical ventilation    Past Medical History  He,  has a past medical history of Anemia, Arthritis, Atrioventricular canal (AVC), Barrett esophagus, Bronchiolitis, Cancer (HCC) (2002), Chronic diastolic CHF (congestive heart failure) (HCC), Colon polyp, Diabetes mellitus without complication (HCC), Diverticulosis, Gout, Heart disease, Hemangioma, Hyperlipidemia, Hypertension, Myocardial infarct (HCC), Ocular hypertension, Peripheral vascular disease (HCC), Skin cancer, 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HCC), Sleep apnea, and Vitreoretinal degeneration.   Surgical History    Past Surgical History:  Procedure Laterality Date   CATARACT EXTRACTION  2011, 2012   COLONOSCOPY N/A 12/14/2018   Procedure: COLONOSCOPY;  Surgeon: Janalyn Keene NOVAK, MD;  Location: [facility] ENDOSCOPY;  Service: Endoscopy;  Laterality: N/A;    CORONARY ANGIOPLASTY WITH STENT PLACEMENT  12/10/2010   CORONARY STENT INTERVENTION N/A 12/29/2019   Procedure: CORONARY STENT INTERVENTION;  Surgeon: Mady Bruckner, MD;  Location: [facility] INVASIVE CV LAB;  Service: Cardiovascular;  Laterality: N/A;   ESOPHAGOGASTRODUODENOSCOPY N/A 12/14/2018   Procedure: ESOPHAGOGASTRODUODENOSCOPY (EGD);  Surgeon: Janalyn Keene NOVAK, MD;  Location: [facility] ENDOSCOPY;  Service: Endoscopy;  Laterality: N/A;   ESOPHAGOGASTRODUODENOSCOPY N/A 07/09/2024   Procedure: EGD (ESOPHAGOGASTRODUODENOSCOPY);  Surgeon: Onita Elspeth Sharper, DO;  Location: [facility] ENDOSCOPY;  Service: Gastroenterology;  Laterality: N/A;  DM   ESOPHAGOGASTRODUODENOSCOPY (  EGD) WITH PROPOFOL  N/A 06/01/2016   Procedure: ESOPHAGOGASTRODUODENOSCOPY (EGD) WITH PROPOFOL ;  Surgeon: Lamar ONEIDA Holmes, MD;  Location: [facility] ENDOSCOPY;  Service: Endoscopy;  Laterality: N/A;   ESOPHAGOGASTRODUODENOSCOPY (EGD) WITH PROPOFOL  N/A 01/31/2023   Procedure: ESOPHAGOGASTRODUODENOSCOPY (EGD) WITH PROPOFOL ;  Surgeon: Jinny Carmine, MD;  Location: [facility] ENDOSCOPY;  Service: Endoscopy;  Laterality: N/A;   FEMORAL ARTERY STENT Right    HERNIA REPAIR     x2   INTRAOCULAR LENS INSERTION     LEFT HEART CATH AND CORONARY ANGIOGRAPHY N/A 05/09/2017   Procedure: Left Heart Cath and Coronary Angiography;  Surgeon: Florencio Cara BIRCH, MD;  Location: [facility] INVASIVE CV LAB;  Service: Cardiovascular;  Laterality: N/A;   LEFT HEART CATH AND CORONARY ANGIOGRAPHY N/A 12/08/2018   Procedure: LEFT HEART CATH AND CORONARY ANGIOGRAPHY and possible PCI and stent;  Surgeon: Florencio Cara BIRCH, MD;  Location: [facility] INVASIVE CV LAB;  Service: Cardiovascular;  Laterality: N/A;   LEFT HEART CATH AND CORONARY ANGIOGRAPHY N/A 12/29/2019   Procedure: LEFT HEART CATH AND CORONARY ANGIOGRAPHY;  Surgeon: Mady Bruckner, MD;  Location: [facility] INVASIVE CV LAB;  Service: Cardiovascular;  Laterality: N/A;   LEFT HEART CATH AND CORONARY ANGIOGRAPHY Left 12/13/2022    Procedure: LEFT HEART CATH AND CORONARY ANGIOGRAPHY;  Surgeon: Florencio Cara BIRCH, MD;  Location: [facility] INVASIVE CV LAB;  Service: Cardiovascular;  Laterality: Left;   NOSE SURGERY     submucous resection   PROSTATE SURGERY  12/10/2000   ROTATOR CUFF REPAIR Right     Social History   reports that he quit smoking about 47 years ago. His smoking use included cigarettes. He started smoking about 67 years ago. He has never used smokeless tobacco. He reports that he does not drink alcohol and does not use drugs.   Family History   His family history includes Arthritis in his mother; Stroke in his maternal grandfather. There is no history of Breast cancer.   Allergies Allergies  Allergen Reactions   Gabapentin      Other reaction(s): Other (See Comments) Tremors   Peanut-Containing Drug Products Anaphylaxis   Penicillins Hives and Rash    Per notes - patient tolerated cefdinir in 2022    Bee Venom Swelling   Influenza Vaccines Hives   Inh [Isoniazid ] Hives   Kenalog [Triamcinolone Acetonide] Hives   Levaquin  [Levofloxacin ] Other (See Comments)    Tendon, ligament pain.    Lisinopril      Other reaction(s): Hyperkalemia   Nalfon [Fenoprofen Calcium ] Hives   Naproxen    Peanut Oil    Nsaids Rash    Nalfon 600 Nalfon 600   Home Medications  Prior to Admission medications   Medication Sig Start Date End Date Taking? Authorizing Provider  acetaminophen  (TYLENOL ) 500 MG tablet Take 1,000 mg by mouth every 8 (eight) hours as needed for mild pain.    [provider]  apixaban (ELIQUIS) 2.5 MG TABS tablet Take 2.5 mg by mouth. 07/27/24   [provider]  atorvastatin  (LIPITOR ) 80 MG tablet Take 1 tablet (80 mg total) by mouth daily. 04/25/23   Laurita Pillion, MD  azelastine  (ASTELIN ) 0.1 % nasal spray USE 1 TO 2 SPRAYS IN EACH NOSTRIL TWICE A DAY 06/01/20   [provider]  calcitRIOL  (ROCALTROL ) 0.25 MCG capsule Take 0.25 mcg by mouth daily.    [provider]  citalopram  (CELEXA ) 10 MG tablet Take 1 tablet (10 mg total) by mouth daily. 03/12/22   Britta King, MD  clopidogrel  (PLAVIX ) 75 MG tablet  TAKE 1 TABLET BY MOUTH EVERY DAY Patient not taking: Reported on 07/30/2024 07/02/22   Britta King, MD  colchicine  0.6 MG tablet Take 1 tablet (0.6 mg total) by mouth 2 (two) times daily as needed. Patient not taking: Reported on 07/30/2024 07/05/24   Amin, Sumayya, MD  [employer] VITAMIN B12 1000 MCG tablet TAKE 1 TABLET BY MOUTH EVERY DAY 11/21/21   Babara Call, MD  diltiazem  (CARDIZEM ) 30 MG tablet Take 1 tablet (30 mg total) by mouth 2 (two) times daily. Do not take this medication if SBP less than 110 and/or HR less than 60 07/05/24 08/04/24  Caleen Qualia, MD  docusate sodium  (COLACE) 100 MG capsule Take 100 mg by mouth 2 (two) times daily. Patient taking differently: Take 200 mg by mouth daily.    [provider]  EPINEPHrine  (EPI-PEN) 0.3 mg/0.3 mL SOAJ injection Inject into the muscle as needed (anaphylaxis).    [provider]  ergocalciferol  (VITAMIN D2) 1.25 MG (50000 UT) capsule Take 50,000 Units by mouth every 30 (thirty) days.    [provider]  feeding supplement (ENSURE PLUS HIGH PROTEIN) LIQD Take 237 mLs by mouth 3 (three) times daily between meals. 07/05/24   Caleen Qualia, MD  ferrous sulfate  325 (65 FE) MG EC tablet TAKE 1 TABLET BY MOUTH EVERY DAY 02/06/21   Babara Call, MD  fluticasone  (FLONASE ) 50 MCG/ACT nasal spray USE 1 SPRAY IN EACH NOSTRIL EVERY DAY 09/12/20   Britta King, MD  hydrALAZINE  (APRESOLINE ) 50 MG tablet Take 25 mg by mouth 2 (two) times daily. 10/14/23   [provider]  insulin  aspart (NOVOLOG ) 100 UNIT/ML FlexPen Inject 10-30 Units into the skin 3 (three) times daily with meals. Take 10 units before breakfast, 30 units before lunch and 30 units before supper. 04/25/20   [provider]  insulin  glargine (LANTUS ) 100 UNIT/ML injection Inject 0.35 mLs (35 Units total) into the skin at bedtime.  This is a decrease from your previous 45 units nightly. Patient taking differently: Inject 60 Units into the skin at bedtime. 12/30/19   Awanda City, MD  isosorbide  mononitrate (IMDUR ) 120 MG 24 hr tablet Take 1 tablet (120 mg total) by mouth 2 (two) times daily. 11/03/23   Alexander, Natalie, DO  JARDIANCE 10 MG TABS tablet Take 10 mg by mouth daily. 02/19/22   [provider]  lidocaine  (XYLOCAINE ) 2 % solution Use as directed 15 mLs in the mouth or throat every 6 (six) hours as needed (throat/swallowing pain). 07/05/24   Caleen Qualia, MD  loratadine  (CLARITIN ) 10 MG tablet Take 10 mg by mouth daily.    [provider]  magnesium  oxide (MAG-OX) 400 (240 Mg) MG tablet Take 1 tablet by mouth daily. 04/25/22   [provider]  methocarbamol  (ROBAXIN ) 500 MG tablet Take 500 mg by mouth 2 (two) times daily. 11/08/18   [provider]  metolazone  (ZAROXOLYN ) 2.5 MG tablet Take 2.5 mg by mouth once a week. 06/04/24 06/04/25  [provider]  metoprolol  succinate (TOPROL -XL) 100 MG 24 hr tablet Take 1 tablet (100 mg total) by mouth daily. Take with or immediately following a meal. 04/28/24 07/30/24  Trudy Anthony HERO, MD  mometasone  (ASMANEX ) 220 MCG/ACT inhaler Inhale 2 puffs into the lungs daily. 05/30/22   [provider]  montelukast  (SINGULAIR ) 10 MG tablet TAKE 1 TABLET AT BEDTIME 06/26/24   McDonough, Lauren K, PA-C  nitroGLYCERIN  (NITROSTAT ) 0.4 MG SL tablet Place 0.4 mg under the tongue every 5 (five) minutes  x 3 doses as needed for chest pain.    [provider]  pantoprazole  (PROTONIX ) 40 MG tablet Take 1 tablet (40 mg total) by mouth 2 (two) times daily before a meal. 12/17/19   Amin, Ankit C, MD  ranolazine  (RANEXA ) 500 MG 12 hr tablet Take 1 tablet by mouth 2 (two) times daily.    [provider]  Tiotropium Bromide-Olodaterol (STIOLTO RESPIMAT) 2.5-2.5 MCG/ACT AERS Inhale 1 each into the lungs daily.    [provider]   torsemide  (DEMADEX ) 20 MG tablet Take 40 mg by mouth 2 (two) times daily.    [provider]  Scheduled Meds:  Chlorhexidine  Gluconate Cloth  6 each Topical Daily   docusate  100 mg Per Tube BID   etomidate        etomidate   20 mg Intravenous Once   insulin  aspart  0-15 Units Subcutaneous Q4H   ipratropium-albuterol   3 mL Nebulization TID   midazolam   4 mg Intravenous Once   mouth rinse  15 mL Mouth Rinse Q2H   pantoprazole  (PROTONIX ) IV  40 mg Intravenous Q12H   polyethylene glycol  17 g Per Tube Daily   rocuronium   100 mg Intravenous Once   tranexamic acid   500 mg Nebulization Q8H   tranexamic acid   500 mg Nebulization Q8H   Continuous Infusions:  sodium chloride      amiodarone  60 mg/hr (09/01/2024 0932)   Followed by   amiodarone      amiodarone      azithromycin  250 mL/hr at 09/03/2024 0700   cefTRIAXone  (ROCEPHIN )  IV Stopped (08/31/2024 9356)   fentaNYL      fentaNYL  infusion INTRAVENOUS 100 mcg/hr (08/25/2024 0752)   norepinephrine  (LEVOPHED ) Adult infusion 5 mcg/min (08/31/2024 0901)   PRN Meds:.amiodarone , docusate sodium , etomidate , fentaNYL , ipratropium-albuterol , midazolam , morphine  injection, ondansetron  (ZOFRAN ) IV, mouth rinse, mouth rinse, polyethylene glycol  Active Hospital Problem list   See systems below  Assessment & Plan:  #Acute Hypoxic Respiratory Failure with hemoptysis iso: #Pulmonary Hemorrhage #Pneumonia #Pulmonary Edema  PMHx: COPDOSA on CPAP -Supplemental O2 as needed to maintain O2 saturations 88 to 92% -Follow cultures, respiratory panel, strep pneumo and Legionella antigen -check vasculitis panel -monitor cbc, fever curve -Ceftriaxone  & Azithromycin  -s/p IV TXA -PRN & scheduled Bronchodilators -Hold Anticoagulation -Keep Npo -Bronchoscopy to obtain BAL and localize source of hemorrhage if indicated  #Acute on Chronic HFpEF #Atrial Fibrillation~rate controlled Hx:CAD s/p PCI w/DES RCA, prox LcxHTN -Euvolemic on exam. BNP on admission  344.3 -Chest XR (9/13): No acute chest findings , CT chest (9/13) trace pulmonary edema and trace bilateral pleural effusions -ECHO (04/24/24): LVEF 60-65% -Home GDMT of: Imdur , diltiazem , hydralazine , Jardiance, metolazone , metoprolol , jardiance, torsemide  PRN -Strict I/Os -Obtain 2Decho -Hold Eliquis and Plavix   -Cardiology consult  #CKD stage IV Creatinine at baseline -F/u BMP+mag -promote renal perfusion -Avoid nephrotoxic agents  -Replete/correct lytes -Follows with nephrology  #Carotid Stenosis -Follows with vascular plan for right carotid endarterectomy 9/15 -Consult vascular as appropriate  #Type II Diabetes Mellitus  -Check A1c -CBG's q4hrs  -SSI -Hold basal while NPO -Target CBG readings 140 to 180 -Follow hypo/hyperglycemic protocol   Best practice:  Diet:  NPO Pain/Anxiety/Delirium protocol (if indicated): No VAP protocol (if indicated): Not indicated DVT prophylaxis: Contraindicated GI prophylaxis: PPI Glucose control:  SSI Yes Central venous access:  N/A Arterial line:  N/A Foley:  N/A Mobility:  bed rest  PT consulted: N/A Last date of multidisciplinary goals of care discussion []  Code Status:  full code Disposition: ICU   =  Goals of Care = Code Status Order:FULL  Primary Emergency Contact: Bratz,Anne, Home Phone: [phone]   Critical care provider statement:   Total critical care time: 33 minutes   Performed by: Parris MD   Critical care time was exclusive of separately billable procedures and treating other patients.   Critical care was necessary to treat or prevent imminent or life-threatening deterioration.   Critical care was time spent personally by me on the following activities: development of treatment plan with patient and/or surrogate as well as nursing, discussions with consultants, evaluation of patient's response to treatment, examination of patient, obtaining history from patient or surrogate, ordering and performing  treatments and interventions, ordering and review of laboratory studies, ordering and review of radiographic studies, pulse oximetry and re-evaluation of patient's condition.    Ohm Dentler, M.D.  Pulmonary & Critical Care Medicine

## 2024-09-09 NOTE — IPAL (Signed)
 GOALS OF CARE FAMILY CONFERENCE   I met with wife Jamen Loiseau.  We discussed hospital course, findings and medical plan. Patient with cardiac dysfunction with diastolic CHF, AF, AFL, CAD s/p PCI and came in with hypoxemia and hemoptysis.  He Also has numerous comorbid conditions such as COPD, OSA, DM, CKD, Cancer of prostate, GERD with Barrets esophagus and strictures.  He had cardiac arrest and received ACLS this morning.  Despite ROSC his prognosis is very poor due to ongoing hemoptysis and NSTEMI that require opposing therapeutic approach. Wife understands patient is at risk of death any moment.  She further reports progressive decline over past year with frequent hosptialization and doctor vists due to declinig health.    Patient is unable to breathe independently, unable to protect airway and unable to mobilize secretions.    Explained to family course of therapy and the modalities   Patient with Progressive multiorgan failure with high probability of a very minimal chance of meaningful recovery despite aggressive and optimal medical therapy.    They have consented and agreed to DNR/DNI  Code status   Family are satisfied with Plan of action and management. All questions answered  Additional Critical Care time 34 mins    Halina Picking, M.D.  Pulmonary & Critical Care Medicine  [facility]

## 2024-09-09 NOTE — Plan of Care (Signed)
  Problem: Education: Goal: Knowledge of General Education information will improve Description: Including pain rating scale, medication(s)/side effects and non-pharmacologic comfort measures Outcome: Progressing   Problem: Clinical Measurements: Goal: Ability to maintain clinical measurements within normal limits will improve Outcome: Progressing Goal: Will remain free from infection Outcome: Progressing Goal: Diagnostic test results will improve Outcome: Progressing   Problem: Coping: Goal: Level of anxiety will decrease Outcome: Progressing   Problem: Pain Managment: Goal: General experience of comfort will improve and/or be controlled Outcome: Progressing   Problem: Safety: Goal: Ability to remain free from injury will improve Outcome: Progressing

## 2024-09-09 NOTE — Progress Notes (Signed)
 Patient in acute distress, emergently intubated at bedside. Central line placed. Patient tolerated well. Increased presssor support per verbal MD/NP orders.  Patient pulseless Vtach arrest. Code blue initiated, ACLS protocols followed. Code sheet with medications given.  ROSC achieved. Wife updated and at bedside.  Patient unable to tolerated turns at this time. MD aware.   Increasing pressor support throughout shifts, all pressor medications titrated per MD/NP verbal orders.   Patient with BGs in 400s, MD aware.  MD/NP made aware of urinary retention. Foley placed per verbal orders.   Insulin  drip started, titrated by verbal orders from MD, goal to achieve a decrease in 80/hr in BG levels.   Patient maxed on pressor support, RN called wife to update and encourage to come to bedside.   Patient with decreasing pressures and decreasing o2 sats. MD aware.  RN updated wife on patients status and discussed plan of care options.  MD notified, spouse at bedside, RN requested MD to come to bedside. MD called and spoke with spouse. New CC orders placed.

## 2024-09-09 NOTE — Procedures (Signed)
 PROCEDURE: BRONCHOSCOPY Therapeutic Aspiration of Tracheobronchial Tree and bronchoalveolar lavage  PROCEDURE DATE: 08/28/2024  TIME:  NAME:  Mike Decker  DOB:March 24, 1941  MRN: [id number] LOC:  IC03A/IC03A-AA    HOSP DAY: @LENGTHOFSTAYDAYS @ CODE STATUS:      Code Status Orders  (From admission, onward)           Start     Ordered   08/22/24 0444  Full code  Continuous       Question:  By:  Answer:  Consent: discussion documented in EHR   08/22/24 0445           Code Status History     Date Active Date Inactive Code Status Order ID Comments User Context   07/02/2024 0612 07/05/2024 2006 Full Code [id number]  Lawence Madison LABOR, MD ED   04/23/2024 0819 04/28/2024 1501 Full Code [id number]  Laurita Cort DASEN, MD ED   10/30/2023 0947 11/03/2023 2338 Full Code [id number]  Eldonna Elspeth PARAS, MD ED   04/18/2023 0822 04/24/2023 1924 Full Code [id number]  Eldonna Elspeth PARAS, MD ED   12/13/2022 1530 12/13/2022 2217 Full Code [id number]  Florencio Cara BIRCH, MD Inpatient   08/15/2022 0746 08/17/2022 2353 Full Code [id number]  Hilma Rankins, MD ED   12/29/2019 0313 12/30/2019 1925 Full Code [id number]  Mansy, Madison LABOR, MD ED   12/14/2019 0810 12/17/2019 2053 Full Code [id number]  Hilma Rankins, MD ED   12/05/2018 1415 12/15/2018 1857 Full Code [id number]  Almeda Bernard, MD ED   11/15/2018 0047 11/17/2018 1747 Full Code [id number]  Dallis Slough, MD Inpatient   05/07/2017 0047 05/15/2017 1850 Full Code [id number]  Hugelmeyer, [place], DO Inpatient   10/08/2015 1648 10/09/2015 1659 Full Code [id number]  Telford Ingle, MD Inpatient           Indications/Preliminary Diagnosis:   Consent: (Place X beside choice/s below)  The benefits, risks and possible complications of the procedure were        explained to:  ___ patient  _x__ patient's family- WIFE  ___ other:___________  who verbalized understanding and gave:  ___ verbal  __x_ written  ___ verbal and written  ___ telephone  ___ other:________ consent.       Unable to obtain consent; procedure performed on emergent basis.     Other:       PRESEDATION ASSESSMENT: History and Physical has been performed. Patient meds and allergies have been reviewed. Presedation airway examination has been performed and documented. Baseline vital signs, sedation score, oxygenation status, and cardiac rhythm were reviewed. Patient was deemed to be in satisfactory condition to undergo the procedure.        PROCEDURE DETAILS: Timeout performed and correct patient, name, & ID confirmed. Following prep per Pulmonary policy, appropriate sedation was administered. The Bronchoscope was inserted in to oral cavity with bite block in place. Therapeutic aspiration of Tracheobronchial tree was performed.  Airway exam proceeded with findings, technical procedures, and specimen collection as noted below. At the end of exam the scope was withdrawn without incident. Impression and Plan as noted below.           Airway Prep (Place X beside choice below)   1% Transtracheal Lidocaine  Anesthetization 7 cc   Patient prepped per Bronchoscopy Lab Policy       Insertion Route (Place X beside choice below)   Nasal   Oral  x Endotracheal Tube   Tracheostomy   INTRAPROCEDURE MEDICATIONS:  Sedative/Narcotic Amt Dose   Versed   mg   Fentanyl   mcg  Diprivan  gtt mg       Medication Amt Dose  Medication Amt Dose  Lidocaine  1%  cc  Epinephrine  1:10,000 sol  cc  Xylocaine  4%  cc  Cocaine  cc   TECHNICAL PROCEDURES: (Place X beside choice below)   Procedures  Description    None     Electrocautery     Cryotherapy     Balloon Dilatation     Bronchography     Stent Placement   x  Therapeutic Aspiration Bilateral lungs    Laser/Argon Plasma    Brachytherapy Catheter Placement    Foreign Body Removal         SPECIMENS (Sites): (Place X beside choice below)  Specimens Description   No Specimens Obtained     Washings   x Lavage Right middle lobe   Biopsies    Fine  Needle Aspirates    Brushings    Sputum    FINDINGS:   BLOOD IN LUNGS BILATERALLY WHICH WAS THERAPEUTICALLY ASPIRATED.  ICE COLD LAVAGE APPLIED WITH IMPROVEMENT.  RIGHT MIDDLE LOBE MEDIAL SEGMENT HAS ACTIVE BLEEDING AS ILLUSTRATED WITH PICTORIAL DOCUMENTATION.    ESTIMATED BLOOD LOSS: none COMPLICATIONS/RESOLUTION: none   RECOMMENDATION/PLAN:   BAL CULTURES AND GRAM STAIN FROM RML   Jilleen Essner, M.D.  Pulmonary & Critical Care Medicine  [facility]

## 2024-09-09 DEATH — deceased

## 2024-10-01 ENCOUNTER — Ambulatory Visit: Admitting: Nurse Practitioner

## 2024-10-15 ENCOUNTER — Ambulatory Visit: Admitting: Physician Assistant

## 2024-10-20 ENCOUNTER — Ambulatory Visit: Admitting: Internal Medicine

## 2025-04-07 ENCOUNTER — Encounter: Admitting: Internal Medicine
